# Patient Record
Sex: Female | Born: 1940 | Race: White | Hispanic: No | Marital: Married | Smoking: Former smoker
Health system: Southern US, Community
[De-identification: ages and names within clinical notes are randomized; demographics above are authoritative.]

## PROBLEM LIST (undated history)

## (undated) DIAGNOSIS — I714 Abdominal aortic aneurysm, without rupture, unspecified: Secondary | ICD-10-CM

## (undated) DIAGNOSIS — G43909 Migraine, unspecified, not intractable, without status migrainosus: Secondary | ICD-10-CM

## (undated) DIAGNOSIS — M8580 Other specified disorders of bone density and structure, unspecified site: Secondary | ICD-10-CM

## (undated) DIAGNOSIS — I4891 Unspecified atrial fibrillation: Secondary | ICD-10-CM

## (undated) DIAGNOSIS — M1990 Unspecified osteoarthritis, unspecified site: Secondary | ICD-10-CM

## (undated) DIAGNOSIS — I10 Essential (primary) hypertension: Secondary | ICD-10-CM

## (undated) DIAGNOSIS — E785 Hyperlipidemia, unspecified: Secondary | ICD-10-CM

## (undated) DIAGNOSIS — K219 Gastro-esophageal reflux disease without esophagitis: Secondary | ICD-10-CM

## (undated) DIAGNOSIS — Z95810 Presence of automatic (implantable) cardiac defibrillator: Secondary | ICD-10-CM

## (undated) DIAGNOSIS — J189 Pneumonia, unspecified organism: Secondary | ICD-10-CM

## (undated) DIAGNOSIS — J42 Unspecified chronic bronchitis: Secondary | ICD-10-CM

## (undated) DIAGNOSIS — M109 Gout, unspecified: Secondary | ICD-10-CM

## (undated) DIAGNOSIS — F329 Major depressive disorder, single episode, unspecified: Secondary | ICD-10-CM

## (undated) DIAGNOSIS — E039 Hypothyroidism, unspecified: Secondary | ICD-10-CM

## (undated) DIAGNOSIS — I739 Peripheral vascular disease, unspecified: Secondary | ICD-10-CM

## (undated) DIAGNOSIS — J449 Chronic obstructive pulmonary disease, unspecified: Secondary | ICD-10-CM

## (undated) DIAGNOSIS — I509 Heart failure, unspecified: Secondary | ICD-10-CM

## (undated) DIAGNOSIS — F32A Depression, unspecified: Secondary | ICD-10-CM

## (undated) DIAGNOSIS — Z8719 Personal history of other diseases of the digestive system: Secondary | ICD-10-CM

## (undated) HISTORY — DX: Gastro-esophageal reflux disease without esophagitis: K21.9

## (undated) HISTORY — PX: EYE SURGERY: SHX253

## (undated) HISTORY — PX: COLONOSCOPY: SHX174

## (undated) HISTORY — PX: CATARACT EXTRACTION W/ INTRAOCULAR LENS  IMPLANT, BILATERAL: SHX1307

## (undated) HISTORY — PX: VARICOSE VEIN SURGERY: SHX832

## (undated) HISTORY — PX: CARDIAC CATHETERIZATION: SHX172

## (undated) HISTORY — DX: Migraine, unspecified, not intractable, without status migrainosus: G43.909

## (undated) HISTORY — PX: TIBIA FRACTURE SURGERY: SHX806

## (undated) HISTORY — DX: Unspecified atrial fibrillation: I48.91

## (undated) HISTORY — PX: BREAST SURGERY: SHX581

## (undated) HISTORY — DX: Essential (primary) hypertension: I10

## (undated) HISTORY — DX: Chronic obstructive pulmonary disease, unspecified: J44.9

## (undated) HISTORY — PX: FRACTURE SURGERY: SHX138

## (undated) HISTORY — PX: TONSILLECTOMY: SUR1361

## (undated) HISTORY — DX: Other specified disorders of bone density and structure, unspecified site: M85.80

---

## 1970-03-15 HISTORY — PX: CHOLECYSTECTOMY OPEN: SUR202

## 1997-05-27 ENCOUNTER — Ambulatory Visit (HOSPITAL_COMMUNITY): Admission: RE | Admit: 1997-05-27 | Discharge: 1997-05-27 | Payer: Self-pay | Admitting: Internal Medicine

## 1997-06-05 ENCOUNTER — Other Ambulatory Visit: Admission: RE | Admit: 1997-06-05 | Discharge: 1997-06-05 | Payer: Self-pay | Admitting: Obstetrics and Gynecology

## 1998-01-06 ENCOUNTER — Other Ambulatory Visit: Admission: RE | Admit: 1998-01-06 | Discharge: 1998-01-06 | Payer: Self-pay | Admitting: Obstetrics and Gynecology

## 1998-01-09 ENCOUNTER — Other Ambulatory Visit: Admission: RE | Admit: 1998-01-09 | Discharge: 1998-01-09 | Payer: Self-pay | Admitting: Obstetrics and Gynecology

## 1998-04-07 ENCOUNTER — Other Ambulatory Visit: Admission: RE | Admit: 1998-04-07 | Discharge: 1998-04-07 | Payer: Self-pay | Admitting: Obstetrics and Gynecology

## 1998-09-26 ENCOUNTER — Other Ambulatory Visit: Admission: RE | Admit: 1998-09-26 | Discharge: 1998-09-26 | Payer: Self-pay | Admitting: Obstetrics and Gynecology

## 1998-09-26 ENCOUNTER — Encounter (INDEPENDENT_AMBULATORY_CARE_PROVIDER_SITE_OTHER): Payer: Self-pay

## 2001-03-09 ENCOUNTER — Encounter: Payer: Self-pay | Admitting: Internal Medicine

## 2001-03-09 ENCOUNTER — Ambulatory Visit (HOSPITAL_COMMUNITY): Admission: RE | Admit: 2001-03-09 | Discharge: 2001-03-09 | Payer: Self-pay | Admitting: Internal Medicine

## 2002-04-06 ENCOUNTER — Other Ambulatory Visit: Admission: RE | Admit: 2002-04-06 | Discharge: 2002-04-06 | Payer: Self-pay | Admitting: Obstetrics and Gynecology

## 2003-04-09 ENCOUNTER — Other Ambulatory Visit: Admission: RE | Admit: 2003-04-09 | Discharge: 2003-04-09 | Payer: Self-pay | Admitting: Obstetrics and Gynecology

## 2003-05-19 ENCOUNTER — Emergency Department (HOSPITAL_COMMUNITY): Admission: AD | Admit: 2003-05-19 | Discharge: 2003-05-19 | Payer: Self-pay | Admitting: Family Medicine

## 2003-05-22 ENCOUNTER — Inpatient Hospital Stay (HOSPITAL_COMMUNITY): Admission: EM | Admit: 2003-05-22 | Discharge: 2003-05-28 | Payer: Self-pay | Admitting: Emergency Medicine

## 2003-05-23 ENCOUNTER — Encounter: Payer: Self-pay | Admitting: Cardiology

## 2003-05-30 ENCOUNTER — Inpatient Hospital Stay (HOSPITAL_COMMUNITY): Admission: EM | Admit: 2003-05-30 | Discharge: 2003-05-31 | Payer: Self-pay | Admitting: Emergency Medicine

## 2003-06-01 ENCOUNTER — Inpatient Hospital Stay (HOSPITAL_COMMUNITY): Admission: AD | Admit: 2003-06-01 | Discharge: 2003-06-11 | Payer: Self-pay | Admitting: Cardiology

## 2003-06-03 ENCOUNTER — Encounter: Payer: Self-pay | Admitting: Cardiology

## 2003-06-12 ENCOUNTER — Inpatient Hospital Stay (HOSPITAL_COMMUNITY): Admission: EM | Admit: 2003-06-12 | Discharge: 2003-06-21 | Payer: Self-pay | Admitting: Emergency Medicine

## 2003-07-18 ENCOUNTER — Inpatient Hospital Stay (HOSPITAL_COMMUNITY): Admission: EM | Admit: 2003-07-18 | Discharge: 2003-07-21 | Payer: Self-pay | Admitting: Emergency Medicine

## 2003-10-09 ENCOUNTER — Ambulatory Visit (HOSPITAL_COMMUNITY): Admission: RE | Admit: 2003-10-09 | Discharge: 2003-10-09 | Payer: Self-pay | Admitting: Internal Medicine

## 2004-01-14 ENCOUNTER — Ambulatory Visit: Payer: Self-pay

## 2004-02-11 ENCOUNTER — Ambulatory Visit: Payer: Self-pay | Admitting: *Deleted

## 2004-02-25 ENCOUNTER — Ambulatory Visit: Payer: Self-pay | Admitting: Cardiology

## 2004-03-12 ENCOUNTER — Ambulatory Visit: Payer: Self-pay | Admitting: Internal Medicine

## 2004-03-17 ENCOUNTER — Ambulatory Visit: Payer: Self-pay | Admitting: *Deleted

## 2004-03-24 ENCOUNTER — Ambulatory Visit: Payer: Self-pay | Admitting: Internal Medicine

## 2004-03-24 ENCOUNTER — Ambulatory Visit: Payer: Self-pay | Admitting: Cardiology

## 2004-03-26 ENCOUNTER — Ambulatory Visit: Payer: Self-pay

## 2004-04-07 ENCOUNTER — Ambulatory Visit: Payer: Self-pay | Admitting: Cardiology

## 2004-04-21 ENCOUNTER — Ambulatory Visit: Payer: Self-pay | Admitting: Cardiology

## 2004-04-23 ENCOUNTER — Ambulatory Visit (HOSPITAL_COMMUNITY): Admission: RE | Admit: 2004-04-23 | Discharge: 2004-04-23 | Payer: Self-pay | Admitting: Cardiology

## 2004-04-27 ENCOUNTER — Ambulatory Visit: Payer: Self-pay

## 2004-04-27 ENCOUNTER — Ambulatory Visit: Payer: Self-pay | Admitting: Internal Medicine

## 2004-04-29 ENCOUNTER — Other Ambulatory Visit: Admission: RE | Admit: 2004-04-29 | Discharge: 2004-04-29 | Payer: Self-pay | Admitting: Obstetrics and Gynecology

## 2004-05-07 ENCOUNTER — Ambulatory Visit: Payer: Self-pay | Admitting: Internal Medicine

## 2004-05-08 ENCOUNTER — Ambulatory Visit: Payer: Self-pay | Admitting: Internal Medicine

## 2004-05-12 ENCOUNTER — Ambulatory Visit: Payer: Self-pay | Admitting: Cardiology

## 2004-05-14 ENCOUNTER — Ambulatory Visit: Payer: Self-pay | Admitting: Internal Medicine

## 2004-05-26 ENCOUNTER — Ambulatory Visit: Payer: Self-pay | Admitting: Internal Medicine

## 2004-06-09 ENCOUNTER — Ambulatory Visit: Payer: Self-pay | Admitting: Cardiology

## 2004-07-07 ENCOUNTER — Ambulatory Visit: Payer: Self-pay | Admitting: *Deleted

## 2004-07-07 ENCOUNTER — Ambulatory Visit: Payer: Self-pay | Admitting: Internal Medicine

## 2004-07-28 ENCOUNTER — Ambulatory Visit: Payer: Self-pay | Admitting: Cardiology

## 2004-08-25 ENCOUNTER — Ambulatory Visit: Payer: Self-pay | Admitting: Cardiovascular Disease

## 2004-09-21 ENCOUNTER — Ambulatory Visit: Payer: Self-pay | Admitting: Internal Medicine

## 2004-10-20 ENCOUNTER — Ambulatory Visit: Payer: Self-pay | Admitting: Cardiology

## 2004-11-03 ENCOUNTER — Ambulatory Visit: Payer: Self-pay | Admitting: Cardiology

## 2004-11-17 ENCOUNTER — Ambulatory Visit: Payer: Self-pay | Admitting: Cardiology

## 2004-12-08 ENCOUNTER — Ambulatory Visit: Payer: Self-pay | Admitting: Cardiology

## 2004-12-18 ENCOUNTER — Ambulatory Visit: Payer: Self-pay | Admitting: Cardiology

## 2005-01-05 ENCOUNTER — Ambulatory Visit: Payer: Self-pay | Admitting: Cardiology

## 2005-01-22 ENCOUNTER — Ambulatory Visit: Payer: Self-pay | Admitting: Internal Medicine

## 2005-02-02 ENCOUNTER — Ambulatory Visit: Payer: Self-pay | Admitting: Cardiology

## 2005-03-02 ENCOUNTER — Ambulatory Visit: Payer: Self-pay | Admitting: Cardiology

## 2005-03-17 ENCOUNTER — Ambulatory Visit: Payer: Self-pay | Admitting: Cardiovascular Disease

## 2005-03-30 ENCOUNTER — Ambulatory Visit: Payer: Self-pay | Admitting: Cardiology

## 2005-04-13 ENCOUNTER — Ambulatory Visit: Payer: Self-pay | Admitting: Cardiology

## 2005-04-30 ENCOUNTER — Other Ambulatory Visit: Admission: RE | Admit: 2005-04-30 | Discharge: 2005-04-30 | Payer: Self-pay | Admitting: Obstetrics and Gynecology

## 2005-05-04 ENCOUNTER — Ambulatory Visit: Payer: Self-pay | Admitting: Cardiology

## 2005-05-25 ENCOUNTER — Ambulatory Visit: Payer: Self-pay | Admitting: Cardiology

## 2005-06-08 ENCOUNTER — Ambulatory Visit: Payer: Self-pay | Admitting: Cardiology

## 2005-07-06 ENCOUNTER — Ambulatory Visit: Payer: Self-pay | Admitting: Cardiology

## 2005-08-03 ENCOUNTER — Ambulatory Visit: Payer: Self-pay | Admitting: *Deleted

## 2005-08-19 ENCOUNTER — Ambulatory Visit: Payer: Self-pay | Admitting: Internal Medicine

## 2005-08-24 ENCOUNTER — Ambulatory Visit: Payer: Self-pay | Admitting: Cardiology

## 2005-09-21 ENCOUNTER — Ambulatory Visit: Payer: Self-pay | Admitting: Cardiology

## 2005-10-12 ENCOUNTER — Ambulatory Visit: Payer: Self-pay | Admitting: Cardiology

## 2005-11-02 ENCOUNTER — Ambulatory Visit: Payer: Self-pay | Admitting: Internal Medicine

## 2005-11-23 ENCOUNTER — Ambulatory Visit: Payer: Self-pay | Admitting: Cardiology

## 2005-12-14 ENCOUNTER — Ambulatory Visit: Payer: Self-pay | Admitting: Cardiovascular Disease

## 2006-01-04 ENCOUNTER — Ambulatory Visit: Payer: Self-pay | Admitting: Cardiology

## 2006-02-01 ENCOUNTER — Ambulatory Visit: Payer: Self-pay | Admitting: Internal Medicine

## 2006-02-22 ENCOUNTER — Ambulatory Visit: Payer: Self-pay | Admitting: Cardiology

## 2006-03-11 ENCOUNTER — Ambulatory Visit (HOSPITAL_COMMUNITY): Admission: RE | Admit: 2006-03-11 | Discharge: 2006-03-11 | Payer: Self-pay | Admitting: Internal Medicine

## 2006-03-30 ENCOUNTER — Ambulatory Visit: Payer: Self-pay | Admitting: Cardiology

## 2006-03-30 ENCOUNTER — Ambulatory Visit: Payer: Self-pay | Admitting: Internal Medicine

## 2006-04-19 ENCOUNTER — Ambulatory Visit: Payer: Self-pay | Admitting: Internal Medicine

## 2006-04-19 ENCOUNTER — Ambulatory Visit: Payer: Self-pay | Admitting: Cardiology

## 2006-04-19 LAB — CONVERTED CEMR LAB
BUN: 17 mg/dL (ref 6–23)
Calcium: 9.3 mg/dL (ref 8.4–10.5)
Creatinine, Ser: 1 mg/dL (ref 0.4–1.2)
Digitoxin Lvl: 0.9 ng/mL (ref 0.8–2.0)
GFR calc Af Amer: 72 mL/min
Potassium: 4.4 meq/L (ref 3.5–5.1)

## 2006-04-25 ENCOUNTER — Ambulatory Visit: Payer: Self-pay | Admitting: Internal Medicine

## 2006-05-10 ENCOUNTER — Other Ambulatory Visit: Admission: RE | Admit: 2006-05-10 | Discharge: 2006-05-10 | Payer: Self-pay | Admitting: Obstetrics and Gynecology

## 2006-05-31 ENCOUNTER — Ambulatory Visit: Payer: Self-pay | Admitting: Cardiology

## 2006-06-28 ENCOUNTER — Ambulatory Visit: Payer: Self-pay | Admitting: Internal Medicine

## 2006-06-28 ENCOUNTER — Ambulatory Visit: Payer: Self-pay | Admitting: Cardiovascular Disease

## 2006-07-26 ENCOUNTER — Ambulatory Visit: Payer: Self-pay | Admitting: Cardiology

## 2006-08-23 ENCOUNTER — Ambulatory Visit: Payer: Self-pay | Admitting: Internal Medicine

## 2006-09-13 ENCOUNTER — Ambulatory Visit: Payer: Self-pay | Admitting: Cardiology

## 2006-09-27 ENCOUNTER — Ambulatory Visit: Payer: Self-pay | Admitting: Cardiology

## 2006-10-18 ENCOUNTER — Ambulatory Visit: Payer: Self-pay | Admitting: Cardiology

## 2006-11-04 ENCOUNTER — Ambulatory Visit: Payer: Self-pay | Admitting: Cardiology

## 2006-11-25 ENCOUNTER — Ambulatory Visit: Payer: Self-pay | Admitting: Cardiology

## 2006-12-15 ENCOUNTER — Ambulatory Visit: Payer: Self-pay | Admitting: Cardiology

## 2006-12-15 LAB — CONVERTED CEMR LAB
BUN: 18 mg/dL (ref 6–23)
Calcium: 9.5 mg/dL (ref 8.4–10.5)
Creatinine, Ser: 1.2 mg/dL (ref 0.4–1.2)
GFR calc Af Amer: 58 mL/min
Potassium: 4.7 meq/L (ref 3.5–5.1)

## 2006-12-23 ENCOUNTER — Ambulatory Visit (HOSPITAL_COMMUNITY): Admission: RE | Admit: 2006-12-23 | Discharge: 2006-12-23 | Payer: Self-pay | Admitting: Internal Medicine

## 2007-01-13 ENCOUNTER — Ambulatory Visit: Payer: Self-pay | Admitting: Internal Medicine

## 2007-01-13 ENCOUNTER — Ambulatory Visit: Payer: Self-pay | Admitting: Cardiology

## 2007-02-10 ENCOUNTER — Ambulatory Visit: Payer: Self-pay | Admitting: Cardiovascular Disease

## 2007-03-17 ENCOUNTER — Ambulatory Visit: Payer: Self-pay | Admitting: Cardiovascular Disease

## 2007-04-14 ENCOUNTER — Ambulatory Visit: Payer: Self-pay | Admitting: Cardiology

## 2007-04-27 ENCOUNTER — Ambulatory Visit (HOSPITAL_COMMUNITY): Admission: RE | Admit: 2007-04-27 | Discharge: 2007-04-27 | Payer: Self-pay | Admitting: Internal Medicine

## 2007-05-12 ENCOUNTER — Ambulatory Visit: Payer: Self-pay | Admitting: Cardiology

## 2007-05-30 ENCOUNTER — Other Ambulatory Visit: Admission: RE | Admit: 2007-05-30 | Discharge: 2007-05-30 | Payer: Self-pay | Admitting: Obstetrics and Gynecology

## 2007-06-09 ENCOUNTER — Ambulatory Visit: Payer: Self-pay | Admitting: Internal Medicine

## 2007-08-02 ENCOUNTER — Ambulatory Visit: Payer: Self-pay | Admitting: Cardiology

## 2007-09-01 ENCOUNTER — Ambulatory Visit: Payer: Self-pay | Admitting: Cardiology

## 2007-11-10 ENCOUNTER — Ambulatory Visit: Payer: Self-pay | Admitting: Cardiology

## 2007-12-01 ENCOUNTER — Ambulatory Visit: Payer: Self-pay | Admitting: Internal Medicine

## 2008-01-12 ENCOUNTER — Ambulatory Visit: Payer: Self-pay | Admitting: Internal Medicine

## 2008-02-19 ENCOUNTER — Ambulatory Visit: Payer: Self-pay | Admitting: Internal Medicine

## 2008-03-14 ENCOUNTER — Ambulatory Visit: Payer: Self-pay | Admitting: Cardiology

## 2008-04-09 ENCOUNTER — Ambulatory Visit: Payer: Self-pay | Admitting: Cardiology

## 2008-05-03 ENCOUNTER — Ambulatory Visit: Payer: Self-pay | Admitting: Internal Medicine

## 2008-06-12 ENCOUNTER — Ambulatory Visit: Payer: Self-pay | Admitting: Obstetrics and Gynecology

## 2008-06-12 ENCOUNTER — Other Ambulatory Visit: Admission: RE | Admit: 2008-06-12 | Discharge: 2008-06-12 | Payer: Self-pay | Admitting: Obstetrics and Gynecology

## 2008-06-12 ENCOUNTER — Encounter: Payer: Self-pay | Admitting: Obstetrics and Gynecology

## 2008-06-21 ENCOUNTER — Ambulatory Visit: Payer: Self-pay | Admitting: Obstetrics and Gynecology

## 2008-08-13 ENCOUNTER — Encounter: Payer: Self-pay | Admitting: *Deleted

## 2008-09-18 ENCOUNTER — Encounter: Payer: Self-pay | Admitting: *Deleted

## 2008-11-07 ENCOUNTER — Encounter: Payer: Self-pay | Admitting: Internal Medicine

## 2008-11-07 ENCOUNTER — Telehealth: Payer: Self-pay | Admitting: Internal Medicine

## 2008-11-22 DIAGNOSIS — I10 Essential (primary) hypertension: Secondary | ICD-10-CM

## 2008-11-22 DIAGNOSIS — I2510 Atherosclerotic heart disease of native coronary artery without angina pectoris: Secondary | ICD-10-CM

## 2008-11-22 DIAGNOSIS — J449 Chronic obstructive pulmonary disease, unspecified: Secondary | ICD-10-CM | POA: Insufficient documentation

## 2008-11-22 DIAGNOSIS — R0902 Hypoxemia: Secondary | ICD-10-CM

## 2008-11-22 DIAGNOSIS — K219 Gastro-esophageal reflux disease without esophagitis: Secondary | ICD-10-CM

## 2008-11-22 DIAGNOSIS — I4891 Unspecified atrial fibrillation: Secondary | ICD-10-CM | POA: Insufficient documentation

## 2008-11-22 DIAGNOSIS — G43909 Migraine, unspecified, not intractable, without status migrainosus: Secondary | ICD-10-CM | POA: Insufficient documentation

## 2008-11-25 ENCOUNTER — Ambulatory Visit: Payer: Self-pay | Admitting: Internal Medicine

## 2008-11-25 DIAGNOSIS — I5042 Chronic combined systolic (congestive) and diastolic (congestive) heart failure: Secondary | ICD-10-CM | POA: Insufficient documentation

## 2008-11-29 ENCOUNTER — Encounter: Payer: Self-pay | Admitting: Internal Medicine

## 2008-11-29 ENCOUNTER — Ambulatory Visit: Payer: Self-pay

## 2008-12-05 ENCOUNTER — Telehealth: Payer: Self-pay | Admitting: Internal Medicine

## 2008-12-18 ENCOUNTER — Ambulatory Visit: Payer: Self-pay | Admitting: Obstetrics and Gynecology

## 2008-12-31 ENCOUNTER — Ambulatory Visit: Payer: Self-pay | Admitting: Obstetrics and Gynecology

## 2009-01-10 ENCOUNTER — Ambulatory Visit: Payer: Self-pay | Admitting: Obstetrics and Gynecology

## 2009-02-12 ENCOUNTER — Ambulatory Visit: Payer: Self-pay | Admitting: Internal Medicine

## 2009-06-18 ENCOUNTER — Ambulatory Visit: Payer: Self-pay | Admitting: Obstetrics and Gynecology

## 2009-06-23 ENCOUNTER — Encounter: Payer: Self-pay | Admitting: Internal Medicine

## 2009-08-29 ENCOUNTER — Encounter: Payer: Self-pay | Admitting: Internal Medicine

## 2009-10-24 ENCOUNTER — Ambulatory Visit (HOSPITAL_COMMUNITY): Admission: RE | Admit: 2009-10-24 | Discharge: 2009-10-24 | Payer: Self-pay | Admitting: Internal Medicine

## 2009-11-12 ENCOUNTER — Ambulatory Visit: Payer: Self-pay | Admitting: Internal Medicine

## 2009-12-03 ENCOUNTER — Encounter: Payer: Self-pay | Admitting: Internal Medicine

## 2009-12-03 ENCOUNTER — Ambulatory Visit: Payer: Self-pay

## 2010-04-05 ENCOUNTER — Encounter: Payer: Self-pay | Admitting: Internal Medicine

## 2010-04-14 NOTE — Miscellaneous (Signed)
Summary: AAA Ultrasound  Clinical Lists Changes  Problems: Added new problem of ABDOMINAL AORTIC ANEURYSM (ICD-441.4) Orders: Added new Test order of Abdominal Aorta Duplex (Abd Aorta Duplex) - Signed

## 2010-04-14 NOTE — Assessment & Plan Note (Signed)
Summary: rov/jml   Visit Type:  Follow-up Primary Provider:  Dr.McKeown   History of Present Illness: Katie [name] returns today for followup.  She is a 70 yo woman with a h/o atrial fibrillation, AAA, moderate LV dysfunction and class 2 CHF.  When I saw her several weeks ago, she had c/o increased dyspnea and told that she had bruits.  She had a 2D echo and carotid dopplers which demonstrated an EF 40% and no obstructive carotid vascular disease.  The patient remains active, and is walking 3 times a week for 3 miles a day.   No C/P or peripheral edema.  Current Medications (verified): 1)  Aspirin 81 Mg Tbec (Aspirin) .... Take One Tablet By Mouth Daily 2)  Cvs Vit D 5000 High-Potency 5000 Unit Caps (Cholecalciferol) .Marland Kitchen.. 1 By Mouth Once Daily 3)  Calcium Carbonate   Powd (Calcium Carbonate) .Marland Kitchen.. 1 By Mouth Once Daily 4)  Pravastatin Sodium 40 Mg Tabs (Pravastatin Sodium) .... Take One Tablet By Mouth Daily At Bedtime 5)  Warfarin Sodium 2 Mg Tabs (Warfarin Sodium) .... Use As Directed By Anticoagualtion Clinic 6)  Metoprolol Succinate 50 Mg Xr24h-Tab (Metoprolol Succinate) .... Take One Tablet By Mouth Once Daily 7)  Digoxin 0.25 Mg Tabs (Digoxin) .... Take One Tablet By Mouth Daily 8)  Spiriva Handihaler 18 Mcg Caps (Tiotropium Bromide Monohydrate) .Marland Kitchen.. 1 By Mouth Once Daily 9)  Furosemide 80 Mg Tabs (Furosemide) .... Take One Tablet By Mouth Daily. 10)  Mag-Ox 400 400 Mg Tabs (Magnesium Oxide) .Marland Kitchen.. 1 By Mouth Once Daily 11)  Glucosamine 500 Mg Caps (Glucosamine Sulfate) .Marland Kitchen.. 1 By Mouth Once Daily 12)  Levothroid 50 Mcg Tabs (Levothyroxine Sodium) .... Once Daily 13)  Allopurinol 300 Mg Tabs (Allopurinol) .... 1/2 Tab Once Daily  Allergies: 1)  ! Codeine  Past History:  Past Medical History: Last updated: 11/22/2008 ATRIAL FIBRILLATION (ICD-427.31) COPD (ICD-496) BRONCHITIS, CHRONIC (ICD-491.9) BELL'S PALSY, HX OF (ICD-V12.49) GERD (ICD-530.81) HYPERTENSION (ICD-401.9) CAD  (ICD-414.00) MIGRAINE HEADACHE (ICD-346.90) ATRIAL FLUTTER (ICD-427.32)  Past Surgical History: Last updated: 11/22/2008 Cholecystectomy  Review of Systems  The patient denies chest pain, syncope, dyspnea on exertion, and peripheral edema.    Vital Signs:  Patient profile:   70 year old female Height:      64 inches Weight:      186 pounds BMI:     32.04 Pulse rate:   89 / minute BP sitting:   120 / 64  (left arm)  Vitals Entered By: Margaretmary Bayley CMA (November 12, 2009 1:56 PM)  Physical Exam  General:  Well developed, well nourished, in no acute distress. Head:  normocephalic and atraumatic Eyes:  PERRLA/EOM intact; conjunctiva and lids normal. Mouth:  Teeth, gums and palate normal. Oral mucosa normal. Neck:  JVD 6 cm.  No thyromegally.  Bilateral carotid bruits are present. Lungs:  Scattered rales in the bases bilaterally without wheezes, rhonchi or increased work of breathing. Heart:  IRIRI with normal S1 and S2.  PMI is enlarged and laterally displaced. Abdomen:  Bowel sounds positive; abdomen soft and non-tender without masses, organomegaly, or hernias noted. No hepatosplenomegaly. Msk:  Back normal, normal gait. Muscle strength and tone normal. Pulses:  pulses normal in all 4 extremities Extremities:  No clubbing or cyanosis. Neurologic:  Alert and oriented x 3.   EKG  Procedure date:  11/12/2009  Findings:      Atrial fibrillation with a controlled ventricular response rate of: 89.  Impression & Recommendations:  Problem # 1:  ATRIAL FIBRILLATION (ICD-427.31) Her symptoms and ventricular rate appears to be well controlled.  I have asked her to continue her current meds and maintain a low sodium diet. Her updated medication list for this problem includes:    Aspirin 81 Mg Tbec (Aspirin) .Marland Kitchen... Take one tablet by mouth daily    Warfarin Sodium 2 Mg Tabs (Warfarin sodium) ..... Use as directed by anticoagualtion clinic    Metoprolol Succinate 50 Mg Xr24h-tab  (Metoprolol succinate) .Marland Kitchen... Take one tablet by mouth once daily    Digoxin 0.25 Mg Tabs (Digoxin) .Marland Kitchen... Take one tablet by mouth daily  Orders: EKG w/ Interpretation (93000)  Problem # 2:  CONGESTIVE HEART FAILURE UNSPECIFIED (ICD-428.0) Her symptoms are well controlled.  I have asked that she continue her current meds and maintain a low sodium diet. Her updated medication list for this problem includes:    Aspirin 81 Mg Tbec (Aspirin) .Marland Kitchen... Take one tablet by mouth daily    Warfarin Sodium 2 Mg Tabs (Warfarin sodium) ..... Use as directed by anticoagualtion clinic    Metoprolol Succinate 50 Mg Xr24h-tab (Metoprolol succinate) .Marland Kitchen... Take one tablet by mouth once daily    Digoxin 0.25 Mg Tabs (Digoxin) .Marland Kitchen... Take one tablet by mouth daily    Furosemide 80 Mg Tabs (Furosemide) .Marland Kitchen... Take one tablet by mouth daily.  Problem # 3:  HYPERTENSION (ICD-401.9) She will continue her current meds. Her updated medication list for this problem includes:    Aspirin 81 Mg Tbec (Aspirin) .Marland Kitchen... Take one tablet by mouth daily    Metoprolol Succinate 50 Mg Xr24h-tab (Metoprolol succinate) .Marland Kitchen... Take one tablet by mouth once daily    Furosemide 80 Mg Tabs (Furosemide) .Marland Kitchen... Take one tablet by mouth daily.  Patient Instructions: 1)  Your physician recommends that you schedule a follow-up appointment in: [place] Dr. Lovena Le 2)  Your physician recommends that you continue on your current medications as directed. Please refer to the Current Medication list given to you today. 3)  Your physician recommends that you return for lab work in: 4)  Your physician has requested that you have an abdominal aorta duplex. During this test, an ultrasound is used to evaluate the aorta. Allow 30 minutes for this exam. Do not eat after midnight the day before and avoid carbonated beverages. There are no restrictions or special instructions. Evaluate abdonimal aortic aneurysm size.

## 2010-06-25 ENCOUNTER — Encounter: Payer: Self-pay | Admitting: Obstetrics and Gynecology

## 2010-06-25 LAB — PROTIME-INR

## 2010-07-06 ENCOUNTER — Other Ambulatory Visit: Payer: Self-pay | Admitting: Obstetrics and Gynecology

## 2010-07-06 ENCOUNTER — Encounter (INDEPENDENT_AMBULATORY_CARE_PROVIDER_SITE_OTHER): Payer: Medicare Other | Admitting: Obstetrics and Gynecology

## 2010-07-06 ENCOUNTER — Other Ambulatory Visit (HOSPITAL_COMMUNITY)
Admission: RE | Admit: 2010-07-06 | Discharge: 2010-07-06 | Disposition: A | Discharge status: Home / Self Care | Payer: Medicare Other | Source: Ambulatory Visit | Attending: Obstetrics and Gynecology | Admitting: Obstetrics and Gynecology

## 2010-07-06 DIAGNOSIS — N952 Postmenopausal atrophic vaginitis: Secondary | ICD-10-CM

## 2010-07-06 DIAGNOSIS — Z124 Encounter for screening for malignant neoplasm of cervix: Secondary | ICD-10-CM

## 2010-07-06 DIAGNOSIS — N951 Menopausal and female climacteric states: Secondary | ICD-10-CM

## 2010-07-06 DIAGNOSIS — N8110 Cystocele, unspecified: Secondary | ICD-10-CM

## 2010-07-06 DIAGNOSIS — R350 Frequency of micturition: Secondary | ICD-10-CM

## 2010-07-14 LAB — HM PAP SMEAR

## 2010-07-28 NOTE — Assessment & Plan Note (Signed)
[facility]                         ELECTROPHYSIOLOGY OFFICE NOTE   NAME:SNEADAnael, Renz                       MRN:          [id number]  DATE:01/13/2007                            DOB:          07-17-1940    Ms. Terpening returns today for followup.  She is a very pleasant woman with  a history of chronic atrial fibrillation and non-ischemic cardiomyopathy  with mild to moderate LV dysfunction (EF 45% in the past), who returns  today for followup.  She continues to exercise five days a week, walking  two miles a day.  In the last couple of days, she noted a productive  cough with yellow-green sputum production.  No fevers or chills have  been noted.  She returns for additional evaluation.   MEDICATIONS:  1. Aspirin 81 a day.  2. Vitamin D.  3. Calcium.  4. Lasix 40 twice daily.  5. Pravachol 40 a day.  6. Warfarin as directed.  7. Toprol 50 daily.  8. Digoxin 0.25 micrograms daily.   NECK:  Revealed no jugular venous distention.  LUNGS:  Clear bilaterally to auscultation, no wheezes, rales or rhonchi  are present today.  CARDIOVASCULAR EXAM:  Revealed an irregularly irregular rhythm with  normal S1 and S2.  EXTREMITIES:  Demonstrated no edema.  Pulses are 2+ and symmetric.  The weight was 192 pounds.   EKG demonstrates atrial fibrillation with a controlled ventricular  response.   IMPRESSION:  1. Chronic atrial fibrillation.  2. New onset of bronchitis.  3. Non-ischemic cardiomyopathy (intolerant to ACE inhibitors).   DISCUSSION:  Overall, Ms. Kassner is stable, except that she has some  evidence of bronchitis and I have given her a prescription today for an  azithromycin Dosepak.  I plan to see her back in a year.  We discussed  today the possibility of ultimately needing a pacemaker.  At the present  time, I think that is not necessary, but have given her warnings that,  if she develops syncope or worsening shortness of breath, then she is  instructed to call us.     Champ Mungo. Lovena Le, MD  Electronically Signed    GWT/MedQ  DD: 01/13/2007  DT: 01/13/2007  Job #: [id number]   cc:   [name], M.D.

## 2010-07-28 NOTE — Assessment & Plan Note (Signed)
[facility] OFFICE NOTE   NAME:SNEADDajha, Gelardi                       MRN:          [id number]  DATE:01/12/2008                            DOB:          1941/01/08    Katie [name] returns today for followup.  She is a very pleasant 70-year-  old woman with a history of longstanding persistent AFib and diastolic  heart failure and COPD with chronic bronchitis who returns today for  followup.  She continues to not smoke cigarettes and has been smoked  free for several years.  The patient denies chest pain or shortness of  breath today.  She states that when she gets it does due to some  strenuous activity that she will get short of breath, but is otherwise  been quite stable.   MEDICATIONS:  1. Aspirin 81 a day.  2. Pravachol 40 a day.  3. Warfarin as directed.  4. Metoprolol 50 a day.  5. Digoxin 0.25 mg daily.  6. Spiriva 18 mcg daily.  7. Lasix 40 mg half tablet 3 times a day.   The patient has not been tolerant of ACE inhibitor secondary to cough in  the past.   PHYSICAL EXAMINATION:  GENERAL:  She is a pleasant, well-appearing 36-  year-old woman in no acute distress.  VITAL SIGNS:  Blood pressure today was 126/82, the pulse 61 and regular,  the respirations were 18.  NECK:  No jugular venous distention.  LUNGS:  Clear bilaterally to auscultation.  No wheezes, rales, or  rhonchi are present.  CARDIOVASCULAR:  Irregular rhythm with normal S1 and S2.  No murmurs,  rubs, gallops.  ABDOMINAL:  Soft, nontender.  EXTREMITIES:  No edema.   EKG demonstrates atrial fibrillation with an intraventricular conduction  delay.   IMPRESSION:  1. Congestive heart failure predominantly diastolic dysfunction.  2. Chronic atrial fibrillation, initially with a rapid ventricular      response, now with controlled ventricular response.  3. Chronic bronchitis presently stable.  4. Hypertension.   DISCUSSION:   Overall, Katie [name] is stable from arrhythmia perspective.  Her AFib appears to be well controlled.  We will continue her on  Coumadin for thromboembolic prevention.  We will  continue her on beta-blockers and digoxin.  She has not been tolerant of  ACE inhibitors in the  past secondary to cough and will not give her any more dose.  I will  plan to see her back in 1 year.  She has already had flu vaccine.     Champ Mungo. Lovena Le, MD  Electronically Signed    GWT/MedQ  DD: 01/12/2008  DT: 01/12/2008  Job #: [id number]

## 2010-07-29 ENCOUNTER — Encounter: Payer: Self-pay | Admitting: Internal Medicine

## 2010-07-31 ENCOUNTER — Encounter: Payer: Self-pay | Admitting: Internal Medicine

## 2010-07-31 NOTE — H&P (Signed)
NAME:  Katie [name], MECHEM                          ACCOUNT NO.:  [id number]   MEDICAL RECORD NO.:  [id number]                   PATIENT TYPE:  INP   LOCATION:  1830                                 FACILITY:  [facility]   PHYSICIAN:  Kirk Ruths, M.D. [facility]            DATE OF BIRTH:  January 22, 1941   DATE OF ADMISSION:  07/18/2003  DATE OF DISCHARGE:                                HISTORY & PHYSICAL   HISTORY OF PRESENT ILLNESS:  Ms. Samford is a 70 year old female with a  history of recurrent atrial arrhythmias as well as COPD, nonischemic  cardiomyopathy, hypertension, GERD, and nonobstructive coronary artery  disease who underwent cardiac catheterization on May 24, 2003 and was  found to have an EF of 30-35%.  Echo at that time revealed moderate aortic  insufficiency and moderate MR.  At that time she had been treated with  Tikosyn as well as cardioversion.  She failed Tikosyn eventually and was  placed on amiodarone on June 12, 2003.  The patient was discharged in  normal sinus rhythm on June 21, 2003.  Per old records the patient was in  normal sinus rhythm off telemetry on June 20, 2003 with a therapeutic INR  on June 21, 2003 of 2.6.   The patient apparently developed recurrent symptoms and was seen in the  office 1.5 weeks where she complained of chest tightness, dyspnea on  exertion as well as orthopnea.  She was told she was in atrial flutter and  was told to continue the amiodarone load.  Symptoms worsened, however, and  she came to the emergency room tonight.  Her EKG reveals atrial flutter with  rapid ventricular rate with a new left bundle branch block.   ALLERGIES:  CODEINE.   MEDICATIONS:  1. Wellbutrin 150 mg b.i.d.  2. Diovan 160 mg a day.  3. Baby aspirin daily.  4. Amiodarone 400 mg daily.  5. Lasix 40 mg a day.  6. Toprol 25 mg b.i.d.  7. Coumadin daily.  8. Spiriva inhaler.   PAST MEDICAL HISTORY:  1. A flutter status post DC cardioversion, failed Tikosyn.  2.  History of migraines.  3. History of nonobstructive coronary artery disease; EF 35%.  4. Hypertension.  5. GERD.  6. History of Bell's palsy.  7. Status post cholecystectomy.  8. __________ COPD.   SOCIAL HISTORY:  Lives in [place] with her husband.  She has one  daughter.  She has a history of tobacco abuse.  No alcohol or illicit drug  use.   FAMILY HISTORY:  Mother died secondary to cirrhosis.  Father died of unknown  reasons.   REVIEW OF SYSTEMS:  Chest pain, shortness of breath and palpitations,  otherwise negative.   PHYSICAL EXAMINATION:  VITAL SIGNS:  Temp 97.3, blood pressure 151.80, pulse  119 and respirations 22.  GENERAL APPEARANCE:  In general she appears dyspneic and anxious.  HEENT: Grossly normal.  NECK:  No carotid bruits, JVD or thyromegaly.  CHEST:  Clear to auscultation bilaterally.  LUNGS:  No wheezing rhonchi.  HEART:  Tachycardia, __________ systolic murmur.  No rubs.  SKIN:  No rash.  No lesions.  ABDOMEN:  Abdomen soft.  No distention.  Good bowel sounds.  No HSM.  EXTREMITIES:  No clubbing, cyanosis or edema.  Palpable dorsalis pedis  pulses bilaterally.  NEUROLOGIC EXAMINATION:  Cranial nerves II-XII are grossly intact.   LABORATORY DATA:  Chest x-ray is pending.  EKG revealed heart rate of 134, a  left bundle branch block, atrial flutter with rapid ventricular rate.  Laboratory studies are pending at this time.   ASSESSMENT AND PLAN:  1. Atrial flutter on amiodarone and a rapid rate, and becoming dyspneic.  2. Nonobstructive coronary artery disease.  3. Cardiomyopathy; ejection fraction 35% with superimposed atrial     arrhythmia.  4. Hypertension.  5. Gastroesophageal reflux disease.  6. Chronic obstructive pulmonary disease.   Plan:  Our plans are to:  1. Admit the patient.  2. We need to check the records from the Coumadin clinic in the morning     regarding the patient's pro times to assures the patient has been in a     therapeutic  INR range since discharged.  She was therapeutic at 2.6 on     June 21, 2003 and she has remained therapeutic since her discharge she     will need to go ahead and have a cardioversion tomorrow. If she has not     remained therapeutic she will need a TEE guided cardioversion.  3. If she fails cardioversion with amiodarone she will ultimately need     either an atrial fibrillation-flutter ablation versus an AV nodal     ablation/pacemaker.  4. The patient will need to continue her Coumadin load.   The patient was seen and examined by Dr. Kirk Ruths.      Joesphine Bare, P.A. [facility]                      Kirk Ruths, M.D. [facility]    LB/MEDQ  D:  07/18/2003  T:  07/19/2003  Job:  [id number]

## 2010-07-31 NOTE — Discharge Summary (Signed)
NAME:  Katie [name], Katie [name]                          ACCOUNT NO.:  [id number]   MEDICAL RECORD NO.:  [id number]                   PATIENT TYPE:  INP   LOCATION:  49                                 FACILITY:  [facility]   PHYSICIAN:  Ernestine Mcmurray, M.D. [facility]             DATE OF BIRTH:  May 01, 1940   DATE OF ADMISSION:  06/01/2003  DATE OF DISCHARGE:  06/11/2003                                 DISCHARGE SUMMARY   HISTORY OF PRESENT ILLNESS:  This is a 70 year old female with a history of  palpitations and heart fluttering on May 16, 2003.  She has no known  history of heart disease prior to that time.  She was seen by her primary  care doctor earlier this month for shortness of breath, and was found to be  in atrial fibrillation with a rapid ventricular response.  She was  transferred to [facility] and initially treated with IV  Cardizem and then switched to p.o. Cardizem.  She was also anticoagulated.  A 2-D echocardiogram showed an ejection fraction of 40% with moderate global  hypokinesis, as well as inferior akinesis and moderate aortic insufficiency,  moderate mitral regurgitation.  Given segmental wall motion abnormalities on  the echocardiogram, she then underwent a cardiac catheterization which  showed moderate coronary artery disease with an ejection fraction of 35%.  Medical management was recommended with aspirin and a beta blocker.  It was  felt that her cardiomyopathy was non-ischemia.  The shortness of breath  persisted.  She had a history of bronchitis and chronic obstructive  pulmonary disease, as well as active smoking.  Therefore she was seen by Dr.  Legrand Como B. Wert from pulmonary.  He changed her nebulizers to Spiriva and  Advair, as well as a prednisone taper.  She also had a cough, so her ACE  inhibitor was switched to an ORB.  He also recommended a proton pump for  possible reflux.  She was discharged on May 28, 2003.  She had persistent shortness of  breath which increased after being home, and  therefore re-presented on the following day on May 29, 2003.  She was  complaining of shortness of breath and chest pressure.  She was admitted and  a chest x-ray was consistent with mild congestive heart failure.  She was  given IV Lasix and ruled out for a myocardial infarction.  She felt better  the next morning, and was discharged to home in less than 24 hours.  She was  on a dose of Lasix 40 mg at discharge.  She did okay that night, but had an  acute increased shortness of breath on May 31, 2003, at 11 a.m., and she  was told to take extra Lasix, but did not help.  She also complained of  severe orthopnea and was unable to sleep.  She spent the night propped up on  the couch.  She had chest pressure 2/10 in severity which was fairly  constant over the last 24 hours.  She had some cough with no palpitations or  fever, and was readmitted to the hospital.   SECONDARY DIAGNOSES:  1. Atrial fibrillation.  2. Congestive heart failure.  3. Moderate aortic insufficiency.  4. Moderate mitral regurgitation.  5. Chronic bronchitis.  6. Chronic obstructive pulmonary disease.  7. Early coronary artery disease without revascularization.  8. Hypertension.  9. Positive tobacco.  10.      Migraines.  11.      Gastroesophageal reflux disease.  12.      Allergic rhinitis.  13.      Status post cholecystectomy.  14.      Bell's palsy in 1989.  15.      Allergy to CODEINE.   HOSPITAL COURSE:  The patient was admitted.  An INR on admission was 2.9.  Beta natriuretic peptide was 83.  The patient underwent a TEE cardioversion  on June 03, 2003.  She had a normal left atrial appendage.  She failed  cardioversion x3.  She was then placed on amiodarone therapy.  She was seen  by Dr. Ernestine Mcmurray,  who decided on Tikosyn rather than amiodarone.  The  patient had an increased potassium of 6.0.  Apparently had been eating  bananas.  She was instructed  not to eat any more bananas, although a careful  watch would need to be on her potassium.  the patient remained on Tikosyn.  The dosage had been decreased to 125 mcg q.12h.  It had been increased back  up to 250 q.12h.  On June 10, 2003, the patient underwent a successful DC cardioversion with  one biphasic shock of 120 joules.  On March 28th the patient was seen by Dr.  Deboraha Sprang, who agreed with the Tikosyn.  He recommended an increase in  the Lopressor dose and a repeat 2-D echocardiogram in three to six weeks.  The patient remains in sinus rhythm.   DISPOSITION/CONDITION ON DISCHARGE:  The patient was discharged on the  following day in stable condition.   DISCHARGE MEDICATIONS:  1. Mucinex 600 mg, two tab b.i.d.  2. Spiriva 18 mcg daily.  3. Advair one puff b.i.d.  4. Coated aspirin 81 mg daily.  5. Protonix 40 mg daily.  6. Lasix 40 mg daily.  7. Prednisone 10 mg daily, to be tapered by Dr. Melvyn Novas.  8. Tikosyn 250 mg b.i.d.  9. Coumadin 3 mg nightly.  The patient was instructed to take one 2.5 mg tab     as well as 1/2 tab of 1 mg.  10.      Toprol 25 mg daily.  11.      Advair as before.  12.      Diovan 160 mg daily.   DISCHARGE INSTRUCTIONS:  She was to stop her [place].   DIET:  A low-fat, low-salt, low-cholesterol diet.   FOLLOWUP:  1. A follow-up appointment was scheduled with [name] on June 17, 2003,     at 3 p.m.  2. A 2-D echocardiogram to be done that day at 2 p.m.  3. She was to follow with Dr. Melvyn Novas on June 14, 2003, at 11:30 a.m.  4. The patient will also need to be scheduled in the office on Friday for a     BMET.      Forest Becker, C.R.N.P. [facility]  Ernestine Mcmurray, M.D. [facility]    DS/MEDQ  D:  06/11/2003  T:  06/11/2003  Job:  [id number]   cc:   Deboraha Sprang, M.D.   Christena Deem. Melvyn Novas, M.D. [facility]   [name], M.D.  [address] [place]  [place], [place] [address] Fax: [phone]

## 2010-07-31 NOTE — Op Note (Signed)
NAME:  Katie [name], Katie [name]                          ACCOUNT NO.:  [id number]   MEDICAL RECORD NO.:  [id number]                   PATIENT TYPE:  OIB   LOCATION:  2866                                 FACILITY:  [facility]   PHYSICIAN:  Champ Mungo. Lovena Le, M.D.               DATE OF BIRTH:  Apr 16, 1940   DATE OF PROCEDURE:  10/09/2003  DATE OF DISCHARGE:                                 OPERATIVE REPORT   PROCEDURE PERFORMED:  DC cardioversion.   INDICATIONS FOR PROCEDURE:  Persistent atrial fibrillation/flutter.   INTRODUCTION:  The patient is a 70 year old woman with a history of  persistent atrial fibrillation on chronic amiodarone therapy.  Her atrial  fibrillation has been fairly paroxysmal in the past but recently became more  persistent and she was begun on amiodarone.  She has developed atrial  flutter which appeared to be atypical and is now referred for DC  cardioversion.   DESCRIPTION OF PROCEDURE:  After informed consent was obtained, the patient  was placed in the usual position and the electrodispersive pads placed in  the anterior posterior position on the chest and back.  150 mg of sodium  pentothal was given intravenously under the direction of Dr. Oletta Lamas of  anesthesia.  The patient was successfully DC cardioverted with 120 joules of  synchronized biphasic energy which terminated atrial flutter and restored  sinus rhythm.  The patient was allowed to awaken and observed without  particular problem.   COMPLICATIONS:  There were no immediate procedure complications.   RESULTS:  This demonstrates successful DC cardioversion, restoring sinus  rhythm in a patient with persistent atrial fibrillation/flutter on  amiodarone.                                               Champ Mungo. Lovena Le, M.D.    GWT/MEDQ  D:  10/09/2003  T:  10/09/2003  Job:  [id number]   cc:   Ernestine Mcmurray, M.D. [facility]   Christena Deem. Melvyn Novas, M.D. [facility]

## 2010-07-31 NOTE — Discharge Summary (Signed)
NAME:  Katie [name], Katie [name]                          ACCOUNT NO.:  [id number]   MEDICAL RECORD NO.:  [id number]                   PATIENT TYPE:  INP   LOCATION:  42                                 FACILITY:  [facility]   PHYSICIAN:  Ernestine Mcmurray, M.D. [facility]             DATE OF BIRTH:  1941/02/10   DATE OF ADMISSION:  06/01/2003  DATE OF DISCHARGE:  06/11/2003                                 DISCHARGE SUMMARY   DISCHARGE DIAGNOSES:  1. Admitted with severe orthopnea/progressive dyspnea.  2. Multifactorial cause of dyspnea.     A. Cardiac.  Systolic dysfunction/atrial fibrillation.     B. Pulmonary.  Chronic obstructive pulmonary disease plus or minus upper        respiratory infection exacerbation.  3. 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  4. Recalcitrant atrial fibrillation failing direct-current     cardioversion/transesophageal echocardiography March 21.  5. Tikosyn therapy at first in sinus rhythm, then back into atrial     fibrillation and, finally, after a second direct-current cardioversion on     3/28, remains in sinus rhythm.   SECONDARY DIAGNOSES:  1. History of atrial fibrillation.  2. Congestive heart failure.  Ejection fraction 40%.  3. Chronic bronchitis/chronic obstructive pulmonary disease.  4. Early coronary artery disease, no revascularization yet.  5. Hypertension.  6. Migraine headaches.  7. Gastroesophageal reflux disease.  8. Allergic rhinitis.  9. History of Bell's palsy in 1989.  10.      Ankle fracture, remote, on the left ankle.  11.      Active tobacco habituation one and one-half packs per day x 40     years.   PROCEDURES:  1. Transesophageal echocardiogram June 03, 2003:  Normal left atrial     appendage, no thrombus, mild mitral regurgitation, moderate aortic     insufficiency, moderate right ventricular dysfunction, mild to moderate     decreased ejection fraction, failed DCCV, and remains in atrial     fibrillation.  2. Computer tomogram of the chest:  1 cm rounded right lower lobe mass needs     follow-up.  3. June 10, 2003, DCCV with biphasic shock of 120 joules converting to     sinus rhythm, continue Tikosyn.   The patient discharged on March 29 with adjusted medications in continuing  sinus rhythm.   DISCHARGE MEDICATIONS:  1. Mucinex 600 mg twice daily.  2. Spiriva 18 mcg daily.  3. Advair one puff twice daily.  4. Coated aspirin 81 mg daily.  5. Protonix 40 mg a day.  6. Lasix 40 mg daily.  7. Prednisone 10 mg daily.  8. Tikosyn 250 mcg twice daily.  9. Coumadin 3 mg daily.  10.      Toprol 25 mg daily.  11.      Diovan 160 mg daily.   He is to stop taking his [place].   DISCHARGE DIET:  Low-sodium,  low-cholesterol diet.  He will present to the  Coumadin Clinic at [facility] March 31 at 12 noon. A BMET will be taken at that  time.  He is to see [name] April 4 at 3:00 in the afternoon and Dr. Melvyn Novas  April 1 at 11:30 in the morning.   BRIEF HISTORY:  Katie [name] is a 70 year old female.  She had a history of  recalcitrant atrial fibrillation with hospitalizations throughout March  2005.  She was admitted March 19 for a 24-hour period of acute dyspnea  without help from diuretic, Lasix diuresis.  She is experiencing prior to  admission severe orthopnea, slept propped up on a couch overnight.  She has  intermittent diaphoresis, chest pressure 2/10, increased cough, no fever, no  palpitations.  Admission electrocardiogram shows atrial fibrillation at the  rate of 85 beats per minute.   HOSPITAL COURSE:  The patient was admitted to [facility] March 19  with multifactorial dyspnea of both cardiac and pulmonary __________ She was  scheduled immediately for a DC cardioversion to take place March 21.  The  result of the study has been dictated above, and patient remained in atrial  fibrillation after cardioversion.  The patient remained anticoagulated on  Coumadin throughout this  hospitalization.  It was next decided to try  Tikosyn pharmaceutical conversion of her atrial fibrillation.  On March 23,  the patient was started on Tikosyn 500 mcg every 12 hours for pharmacy.  This was later changed to Tikosyn 250 mcg every 12 hours after adjustment  for renal function.  The patient had been on concomitant amiodarone since  TEE-guided DC cardioversion, and the patient, after starting Tikosyn,  experienced some pauses and the amiodarone was discontinued.  By March 25,  the patient had converted to a sinus rhythm after Tikosyn load, the 250  mcg/12 hour dose was decreased to 125 mcg per 12 hours.  Within 24 hours,  however, on March 26, the patient was back in atrial fibrillation.  A second  DC cardioversion attempt was planned, and this was carried out March 18 with  successful conversion to sinus rhythm and maintenance of sinus rhythm.  The  patient goes home on Tikosyn 250 mcg twice daily, continue Coumadin, follow-  up echocardiogram in 3-6 weeks and, also, definitely repeat a computer  tomogram of the chest in three months to follow up on the right lower lobe  mass.      Sueanne Margarita, P.A.                    Ernestine Mcmurray, M.D. [facility]    GM/MEDQ  D:  07/15/2003  T:  07/16/2003  Job:  [id number]   cc:   [name], M.D.  [address], [place]  [place], [place] [address]  Fax: [place] Melvyn Novas, M.D. [facility]

## 2010-07-31 NOTE — Assessment & Plan Note (Signed)
[name]                         ELECTROPHYSIOLOGY OFFICE NOTE   NAME:SNEADAvrie, [name]                       MRN:          [id number]  DATE:06/28/2006                            DOB:          1940-10-08    [name] returns today for followup. 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 The patient recently had had some problems with abdominal pain and  ultimately underwent ultrasound and a CT scan which demonstrated a 3.6  cm abdominal aortic aneurysm. She denies symptoms from this. This  demonstrated a small aneurysm measuring 3 cm near the bifurcation of her  aorta. The patient returns today for followup. She notes that she does  have some dyspnea with exertion but this is not appreciably worse than  it has been in the past and she states that she is now walking 10 miles  per week. She also continues to garden although not at the same  intensity that she once did when she was much lighter and when she was  in sinus rhythm.   PHYSICAL EXAMINATION:  GENERAL:  She is a pleasant, middle-aged woman in  no distress.  VITAL SIGNS:  Blood pressure 127/72, the pulse 55 and irregularly  irregular. Respirations were 18, the weight was 189 pounds.  NECK:  Revealed no jugular venous distention.  LUNGS:  Clear bilaterally to auscultation. There were no wheezes, rales  or rhonchi.  CARDIOVASCULAR:  Irregular bradycardia with normal S1 and S2.  EXTREMITIES:  Demonstrated no cyanosis, clubbing or edema.   Her EKG demonstrates atrial fibrillation.   IMPRESSION:  1. Nonischemic cardiomyopathy thought to be rate related and improved.  2. Chronic atrial fibrillation.  3. History of  tachycardia-induced cardiomyopathy.  4. Recent diagnosis of abdominal aortic aneurysm 3.6 cm.   DISCUSSION:  1. With regard to the patient's A fib, she will continue on with her      present medical therapy.  2. With regard to her heart failure, will continue on with rate      control and p.r.n. Lasix. With  3. With regard to her aneurysm, she will need repeat CT scanning in      approximately 6 months.     Champ Mungo. Lovena Le, MD  Electronically Signed    GWT/MedQ  DD: 06/28/2006  DT: 06/28/2006  Job #: [id number]   cc:   [name], M.D.

## 2010-07-31 NOTE — H&P (Signed)
NAME:  Katie [name], Katie [name]                          ACCOUNT NO.:  [id number]   MEDICAL RECORD NO.:  [id number]                   PATIENT TYPE:  OBV   LOCATION:  36                                 FACILITY:  [facility]   PHYSICIAN:  Larina Earthly. Zenia Resides, M.D. [facility]            DATE OF BIRTH:  [birth year]   DATE OF ADMISSION:  06/01/2003  DATE OF DISCHARGE:                                HISTORY & PHYSICAL   PHYSICIANS:  Cardiologist, Ernestine Mcmurray, M.D.  Primary care physician, [name], M.D.  Pulmonologist, Christena Deem. Melvyn Novas, M.D.   CHIEF COMPLAINT:  Shortness of breath.   HISTORY OF PRESENT ILLNESS:  The patient is a 70 year old female with onset  of palpitations and heart fluttering on May 16, 2003.  She had no known  history of heart disease prior to that time.  She was seen by her primary  care doctor early this month for this shortness of breath and was found to  be in atrial fibrillation with rapid ventricular response.  She was  transferred to [facility] and treated initially with Diltiazem  intravenously then switched to oral Diltiazem.  She was also anticoagulated.  A transthoracic echocardiogram showed an ejection fraction of 40% with  moderate global hypokinesis as well as inferior akinesis with moderate  aortic insufficiency and moderate mitral regurgitation.  Given segmental  wall motion abnormalities on echocardiogram, she then underwent cardiac  catheterization on May 24, 2003, which showed moderate coronary artery  disease with 40 to 50% blockages in a couple of vessels as well as ejection  fraction of 35% and left ventricular end-diastolic pressure of 24.  Medical  management was recommended with aspirin and beta blocker.  It was felt that  her cardiomyopathy was nonischemic.   Her shortness of breath persisted, however.  She has history of bronchitis  and COPD as well as active smoking. Therefore, Dr. Melvyn Novas was consulted for a  pulmonology evaluation.  He changed  her nebulizers to Spiriva and Advair  inhalers as well as prescribed prednisone taper.  She also had some cough,  so her ACE inhibitor was switched to an ARB.  He also recommended proton  pump inhibitor for possible reflux disease.  She was ultimately discharged  to home on May 28, 2003.   She had persistent shortness of breath which increased after being at home  and, therefore, represented the following day on March 16.  She was  complaining of shortness of breath and chest pressure at that time.  She was  admitted overnight.  Chest x-ray was consistent with perhaps mild congestive  heart failure.  She was given IV Lasix.  Rule out myocardial infarction was  negative.  She felt better the next morning and was discharged to home in  less than 24 hours.  She was on a dose of Lasix 40 mg daily p.o. at  discharge. She did okay that night but  then had acute increase in shortness  of breath March 18 at 11 a.m.  She was told to take extra Lasix dose, but it  did not help.  She also complained of severe orthopnea and was unable to  sleep lying down.  She spent the night propped up on her couch. She was also  having intermittent diaphoresis.  She had chest pressure 2/10 in severity  which was fairly constant over the last 24 hours.  She had some cough, no  palpitations and no fevers.  She was direct admitted to the hospital this  afternoon.   PAST MEDICAL HISTORY:  1. Atrial fibrillation.  2. Congestive heart failure.  3. Moderate aortic insufficiency.  4. Moderate mitral regurgitation.  5. Chronic bronchitis/COPD.  6. Early coronary artery disease without revascularization.  7. Hypertension.  8. Active smoker.  9. Migraine headaches.  10.      GERD.  11.      Allergic rhinitis.  12.      Status post cholecystectomy.  13.      Bell's palsy in 1989.  14.      Left ankle fracture in the past.   ALLERGIES:  CODEINE causes change in mental status.   CURRENT MEDICATIONS:  1. Prednisone  20 mg daily taper.  2. Mucinex DM 2 tablets b.i.d.  3. Spiriva 18 mcg inhaler once a day.  4. Advair 250/50 inhaler b.i.d.  5. Aspirin 81 mg daily.  6. Coumadin 2.5 mg nightly.  7. Levaquin antibiotic course which finished yesterday.  8. Protonix 40 mg daily.  9. Diovan 160 mg daily.  10.      Toprol XL 25 mg b.i.d.  11.      Cardizem CD 120 mg daily.  12.      Lasix 40 mg orally daily.  13.      Vitamin C.  14.      Vitamin D.  15.      Calcium.  16.      Imitrex of which she has not taken any for the last 3 months.   SOCIAL HISTORY:  The patient lives in [place] with her husband, Marden Noble.  She also has a daughter who lives in [place].  She formerly worked as a  Educational psychologist.  She smoked one and one-half packs of cigarettes for the last 40  years but has not smoked in three weeks.  She rarely drinks alcohol and has  no alcohol history.  She does not use illicit drugs.   FAMILY HISTORY:  Mother died of cirrhosis.  Father died of unknown causes.   REVIEW OF SYSTEMS:  Positive for sweats, weakness, fever blisters, chest  pressure, shortness of breath, orthopnea, PND, palpitations, cough, nausea,  diarrhea x 2.  Otherwise Review of Systems negative in detail.   ADVANCED DIRECTIVES:  The patient is full code.   PHYSICAL EXAMINATION:  VITAL SIGNS:  Temperature 97.5, pulse 97, blood  pressure 101/63, respirations 20, saturation 98% on room air.  Weight 132  pounds today, down from 145 one week ago.  GENERAL:  The patient was conversant, in no apparent distress.  HEENT:  Pupils were equal.  Mucosa was moist.  Dentition was normal.  Oropharynx not injected.  NECK:  JVP flat.  No carotid bruits.  Thyroid normal.  No lymphadenopathy.  HEART:  Irregularly irregular with normal S1, S2.  No S3 or S4.  She had a  2/6 murmur at the apex which was holosystolic.  There was no evidence  of a  diastolic murmur.  Pulses were 2+ in femorals and PTs.  She had a large ecchymosis in the right groin  but no evidence of active hematoma.  LUNGS:  Few mild wheezes and crackles at the bases, right greater than left.  Her right base was dull relative to the left which was consistent with mild  elevation of the diaphragm on that side.  She was moving air well.  She had  cold sores around her mouth but no other rash.  ABDOMEN:  Soft and nontender.  EXTREMITIES:  Hands were warm.  Feet were mildly cool with no significant  edema.  NEUROLOGIC:  Alert and oriented without neurologic defects.   LABORATORY AND X-RAY DATA:  Chest x-ray on March 9 showed cardiomegaly but  no acute pulmonary process.  She had chest x-ray done March 13 and 17 which  were also negative except for small pleural effusions.   EKG pending at this time.  Prior EKGs have shown atrial fibrillation.   Laboratories from March 18 showed white blood cell count of 11, hematocrit  50.  Sodium 139, potassium 3.9, bicarb 28, BUN 16, creatinine 1.0, glucose  80, calcium 8.9.  LFTs normal.  Troponin 0.01.  BNP 667.  INR 2.9.   Transthoracic echocardiogram from last week showed left ventricular size  within normal limits.  LVEF was 40% with global moderate hypokinesis and  akinesis in the inferior wall.  No LVH.  Moderate AI, moderate MR, moderate  TR.  Mild RV and RA dilation.   Cardiac catheterization from March 11 showed EF of 30 and 35% with EDP of  24, moderate MR and moderate CAD.   IMPRESSION AND PLAN:  A 70 year old female with multifactorial shortness of  breath.  She has now had her third admission in the last two weeks.  This is  likely cardiac from systolic dysfunction compounded by atrial fibrillation  as well as pulmonary causes including chronic obstructive pulmonary disease  with possible upper respiratory tract infection.   1. Systolic failure:  No evidence of active ischemia.  She had a recent     cardiac catheterization that showed no critical stenosis.  However, we     will rule her out here and check EKGs.   Her systolic failure may be     tachycardia mediated related to atrial fibrillation.  We are rate     controlling her current and plan for TEE with cardioversion on Monday.     Her end-diastolic pressure was up at catheterization, but on exam now,     she seems relatively euvolemic.  It would be reasonable to try addition     diuresis and follow her creatinine as well as in's and out's and daily     weights.  Her BNP was mildly elevated a couple of days ago, and we will     recheck it here.  Additionally, she does have significant valvular     disease, and it is reasonable to further evaluate her valves when TEE is     performed.  Ultimately, she is on regimen of beta blocker, ARB, and Lasix     for heart failure treatment.  2. Atrial fibrillation:  As above.  Her rate is borderline high now.  Her     Toprol or her Cardizem could be increased.  Given her heart failure, I     would push her Toprol XL up first.  Additionally, we will plan for    cardioversion  should she not have left atrial thrombus.  She is currently     anticoagulated with INR at goal.  3. Chronic obstructive pulmonary disease:  She is on Spiriva and Advair     inhalers.  These will be continued.  She is on a steroid taper.  She     recently finished an empiric course of antibiotics, and there is no     evidence of current pneumonia.  I have ordered a chest CT with high     resolution cuts to evaluate for other interstitial lung disease     compounding her shortness of breath.  Additionally, this should give some     evidence of the extent of her chronic obstructive pulmonary disease.     Furthermore, when she is stable, PFTs would be appropriate.  4. Consider pulmonary hypertension should above workup be negative.  5. Consider pulmonary embolus:  A D-dimer has been ordered.  6. Fever blisters:  Topical antiherpes medication has been ordered.  7. Active smoking:  The patient was instructed to continue cessation and was      applauded for her effort to quit smoking.                                                Larina Earthly Zenia Resides, M.D. [facility]    LAA/MEDQ  D:  06/01/2003  T:  06/03/2003  Job:  [id number]   cc:   Ernestine Mcmurray, M.D. [facility]   [name], M.D.  [address] [place]  [place], [place] [address]  Fax: [phone]   Christena Deem. Melvyn Novas, M.D. [facility]

## 2010-07-31 NOTE — Discharge Summary (Signed)
NAME:  Katie [name], Katie [name]                          ACCOUNT NO.:  [id number]   MEDICAL RECORD NO.:  [id number]                   PATIENT TYPE:  INP   LOCATION:  2012                                 FACILITY:  [facility]   PHYSICIAN:  Marijo Conception. Wall, M.D.                DATE OF BIRTH:  03-30-1940   DATE OF ADMISSION:  06/12/2003  DATE OF DISCHARGE:  06/21/2003                                 DISCHARGE SUMMARY   DISCHARGE DIAGNOSES:  1. Admitted with resistant, recurrent paroxysmal atrial fibrillation with     rapid ventricular rate, tolerated poorly with symptoms of chest pressure     and diaphoresis.  2. The patient ruled out for myocardial infarction.  3. Recently the patient failed transesophageal echocardiogram     cardioversion/Tikosyn administered during previous hospitalization March     19 to March 29.  4. Converted to sinus rhythm on amiodarone this hospitalization.  5. Hormone anticoagulation.   SECONDARY DIAGNOSES:  1. History of atrial fibrillation.  2. 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  3. Moderate aortic insufficiency.  4. Moderate mitral regurgitation.  5. Chronic bronchitis/severe chronic obstructive pulmonary disease.  6. Ongoing tobacco habituation.  7. Early coronary artery disease by catheterization March 2005 treated with     medical therapy.  8. Ejection fraction 35% on left heart catheterization March 2005.  9. Hypertension.  10.      Migraine headaches.  11.      Gastroesophageal reflux disease.  12.      Allergic rhinitis.  13.      Status post cholecystectomy.  14  History of Bell's palsy in 1989.  1. History of left ankle fracture in the past.   PROCEDURE:  Pulmonary function studies done on June 14, 2003.  This study  shows that the FVC is 108% of predicted.  FEV1 is 100% of predicted.  FEV1/FVC 68%.  FEF 25-75 is 48% of predicted.  Study shows minimal  obstructive lung defect.  Airway obstruction is confirmed by decrease in  flow rate at peak flow and flow at 50% and at 75% of the flow volume curve.  Interpretation:  Mild obstructive airway disease; small airways.  Dr. Annamaria Boots,  practitioner.   DISPOSITION:  Ms. Rubottom is ready for discharge on hospital day #10 after  presentation with recurrent paroxysmal atrial fibrillation with rapid  ventricular response, poorly tolerated.  Upon admission to [facility], she was immediately started on amiodarone 400 mg every 8 hours and  Coumadin adjusted appropriately.  She was ruled out for myocardial  infarction with serial troponin I studies, and her beta blocker was briefly  increased, although this was later decreased back to 25 mg daily.   By postoperative day #5, 2 g amiodarone had been administered, and the  patient was in a sinus rhythm.  It was felt by Dr. Cristopher Peru that a trial  of amiodarone would be a  first line option with failure perhaps suggesting  atrial fibrillation ablation versus A-V node ablation with addition of  permanent pacemaker.  These are all options considered if amiodarone therapy  fails this attempt.   On hospital day #8, she was back in atrial fibrillation with much reduced  rate of 100 to 110, and the patient was asymptomatic.  Twenty-four hours  later, the patient had converted back to sinus rhythm and remains in sinus  rhythm currently.  Her Coumadin dosage has been adjusted to reflect starting  amiodarone therapy, and for a brief time she was subtherapeutic and  maintained on subcutaneous Lovenox.   The patient is asymptomatic on the day of discharge, April 8, and goes home  on the following medications.   DISCHARGE MEDICATIONS:  1. Humibid LA 600 mg 2 tablets twice daily.  2. Spiriva 18 mcg 1 inhalation a day.  3. Advair 1 puff twice a day.  4. Enteric-coated aspirin 81 mg daily.  5. Lasix 40 mg daily.  6. Prednisone 5 mg daily, a new dose.  7. Amiodarone 200 mg tablets, 2 tablets daily.  8. Coumadin 3 mg daily or as  directed by the Coumadin clinic.  9. Avapro 300 mg daily.  10.      Toprol XL 25 mg daily.   DIET:  Low-sodium, low-cholesterol diet.   FOLLOW UP:  1. Dr. Melvyn Novas on April 11 at 1:30 in the afternoon.  2. Coumadin clinic Wednesday, April 13, at 10:30 in the morning.  3. She will present to [facility] Friday, April 22, for an     echocardiogram check.  4. Dr. Lovena Le on Jul 23, 2003, at 4:15 in the afternoon.   BRIEF HISTORY:  [name] is a 70 year old female with a history of  recurrent and resistant paroxysmal atrial fibrillation.  She initially  presented March 3 with atrial fibrillation, underwent left heart  catheterization which showed nonischemic cardiomyopathy, most probably  tachycardic mediated.  She was treated with Cardizem and started on  anticoagulation therapy.  Her ejection fraction during catheterization at  this time was found to be 35%.  She was discharged in sinus rhythm on March  17; however, represented on March 19.  She underwent TEE cardioversion which  failed x 3 and started on Tikosyn with successful followup  direct current cardioversion on March 25.  She went hom on Tikosyn and  Lopressor in sinus rhythm with anticoagulation therapy.  She was readmitted  March 30, this admission, with atrial fibrillation with rapid ventricular  rate despite Tikosyn low-pressure therapy.  She had chest pain and shortness  of breath.  She was admitted for further workup to try to stabilize her  rhythm.   DISCHARGE DISPOSITION:  [name] is ready for discharge April 8.  She is currently in sinus rhythm and has been for the last 48 hours.  She  has been loaded with oral amiodarone for the last eight days and hopefully  will maintain sinus rhythm.  Plans for the future, should she prove  resistant to amiodarone would be atrial fibrillation versus A-V node  ablation with placement of permanent pacemaker.  Also being considered would be a mini-maze procedure or CPI.   She goes home with the medications and  followup as dictated above.   DISCHARGE LABORATORY DATA:  Serum electrolytes on the day of discharge April  8: Sodium 129, potassium 4.4, chloride 95, carbonate 27,  BUN 20, creatinine  1.0, glucose 85.  This hospitalization alkaline  phosphatase was  46, SGOT 26, SGPT 42, urinalysis negative.  BUN was 274.  Her complete blood  count on April 6 was 7.2 white cells, 13.7 hemoglobin, 40.1 hematocrit, 273  platelets.  Pulmonary functions studies are as dictated above.  Her INR at  the time of discharge is 2.6 with a PT of 22.  Coumadin dose is 3 mg daily.      Sueanne Margarita, P.A.                    Thomas C. Wall, M.D.    GM/MEDQ  D:  06/21/2003  T:  06/23/2003  Job:  [id number]   cc:   Champ Mungo. Lovena Le, M.D.   Ernestine Mcmurray, M.D. [facility]   [name], M.D.  [address] [place]  [place], [place] [address]  Fax: [place] Melvyn Novas, M.D. [facility]

## 2010-07-31 NOTE — Cardiovascular Report (Signed)
NAME:  Katie [name], Katie [name]                          ACCOUNT NO.:  [id number]   MEDICAL RECORD NO.:  [id number]                   PATIENT TYPE:  INP   LOCATION:  3729                                 FACILITY:  [facility]   PHYSICIAN:  Junious Silk, M.D. [facility]         DATE OF BIRTH:  12-14-1940   DATE OF PROCEDURE:  05/24/2003  DATE OF DISCHARGE:                              CARDIAC CATHETERIZATION   PROCEDURES PERFORMED:  1. Left heart catheterization with,  2. Coronary angiography and,  3. Left ventriculography.   CARDIOLOGIST:  Junious Silk, M.D.   INDICATIONS:  [name] is a 69 year old woman admitted with atrial  fibrillation and rapid ventricular rate.  An echocardiogram showed  significant decrease in left ventricular systolic function with significant  mitral regurgitation.  An EKG showed changes worrisome for a possible old  anterior infarct.  She was therefore referred for cardiac catheterization.   PROCEDURAL NOTE:  A 5 French sheath was placed in the right femoral artery.  Coronary angiography was performed with 5 [place] JL-4 and JR-4 catheters.  Left ventriculography was performed with an angled pigtail catheter.   CONTRAST MATERIAL:  Contrast used was Omnipaque.   COMPLICATIONS:  There were no complications.   RESULTS:   HEMODYNAMIC DATA:  Left ventricular pressure 140/24.  Aortic pressure  140/80.  There is no aortic valve gradient.   VENTRICULOGRAPHIC DATA:  Left Ventriculogram:  There is global hypokinesis  of the left ventricle.  Ejection fraction is estimated at 30-35%.  There was  2+ mild-to-moderate mitral regurgitation.   ARTERIOGRAPHIC DATA:  Coronary Arteriography (Right Dominant)  Left Main:  Left main is normal.   Left Anterior Descending Artery:  The left anterior descending artery has a  diffuse 20% stenosis in the midvessel.  The LAD gives rise to three small  diagonal branches.   Left Circumflex:  The left circumflex gives rise to a large  bifurcating  first obtuse marginal and small second obtuse marginal branch.  The first  obtuse marginal branch has a 50% stenosis proximally.  After its bifurcation  there is a 40% stenosis in the medial limb and a 30% stenosis in the lateral  limb.   Right Coronary Artery:  The right coronary artery is a dominant vessel.  There is a diffuse 50% stenosis throughout the length of the mid right  coronary artery.  The distal right coronary artery gives rise to a normal-  sized posterior descending artery and three small posterolateral branches.   IMPRESSION:  1. Moderately decreased left ventricular systolic function with global     hypokinesis consistent with a nonischemic cardiomyopathy.  2. Mild-to-moderate mitral regurgitation.  3. Moderate, but nonobstructive coronary artery disease.   PLAN:  The patient will be managed medically for her coronary artery  disease.  She will be started on Coumadin therapy in anticipation of a  direct current cardioversion for atrial fibrillation.  Junious Silk, M.D. [facility]    MWP/MEDQ  D:  05/24/2003  T:  05/24/2003  Job:  [id number]   cc:   [name], M.D.  [address] [place]  [place], [place] [address]  Fax: [phone]   Ernestine Mcmurray, M.D.   [facility]

## 2010-07-31 NOTE — Assessment & Plan Note (Signed)
[facility]                         ELECTROPHYSIOLOGY OFFICE NOTE   NAME:Katie [name], Katie [name]                       MRN:          [id number]  DATE:03/30/2006                            DOB:          [birth year]    [name] returns today for follow-up. 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 She had resolution of her heart failure but continued  dyspnea with maintenance of sinus rhythm on amiodarone.  The patient  unfortunately was subsequently diagnosed with amiodarone lung toxicity  and her amiodarone had to be discontinued. I saw her back in June and  she was maintaining sinus rhythm and stable. She was seen by Dr. Elsie Lincoln back in December and was found to be in atrial fibrillation, and  is now referred back for additional evaluation. The patient's TSH was  within normal limits at that time. Her BNP was slightly elevated.   PHYSICAL EXAMINATION:  GENERAL: On exam today she is a pleasant, well-  appearing middle-aged woman in no acute distress.  VITAL SIGNS: Blood pressure today was 117/80, the pulse was 110 and  irregularly irregular, respirations were 18. Weight 192 pounds.  NECK: Revealed no jugular venous distension.  LUNGS: Clear bilaterally to auscultation, there are no wheezes, rales or  rhonchi.  CARDIOVASCULAR: Revealed an irregularly rate and rhythm with normal S1,  S2.  EXTREMITIES: Demonstrated no cyanosis, clubbing or edema.   EKG demonstrates atrial fibrillation. Respiration was 122 milliseconds.   IMPRESSION:  1. Persistent atrial fibrillation.  2. Chronic Coumadin therapy.  3. History of tachycardia induced cardiomyopathy.  4. Amiodarone induced lung disease.   DISCUSSION:  [name] has returned to atrial fibrillation after long  ago discontinuing her amiodarone. Fortunately she is not as  symptomatic  as she had previously been when she was in atrial fibrillation. For this  reason my recommendation will be that we up titrate her AV nodal  blocking drugs and to this end I have asked that she increase her dose  of Lopressor to 50 twice a day and will start her on digoxin 0.25 mg  daily. Will see her back for an EKG in 3 weeks.  Will also obtain a  digoxin level at that time. I will see her back in several months. She  is instructed to call us if she feels dizzy or lightheaded.    Champ Mungo. Lovena Le, MD  Electronically Signed   GWT/MedQ  DD: 03/30/2006  DT: 03/30/2006  Job #: [id number]   cc:   Leonides Grills, M.D.

## 2010-07-31 NOTE — Consult Note (Signed)
NAME:  Katie [name], Katie [name]                          ACCOUNT NO.:  [id number]   MEDICAL RECORD NO.:  [id number]                   PATIENT TYPE:  INP   LOCATION:  3729                                 FACILITY:  [facility]   PHYSICIAN:  Christena Deem. Melvyn Novas, M.D. [facility]           DATE OF BIRTH:  [birth year]   DATE OF CONSULTATION:  05/25/2003  DATE OF DISCHARGE:                                   CONSULTATION   REASON FOR CONSULTATION:  Unexplained dyspnea, question COPD.   HISTORY:  This is a 70 year old white female, active smoker, with history of  dyspnea going back at least two years with exertion, but for the last three  weeks she has been fighting off a bronchitis with hacking congested cough,  dyspnea, and palpitations.  She was admitted with new onset atrial  arrhythmias and had undergone a Cardiac evaluation that reveals nonischemic  cardiomyopathy with a reduced ejection fraction and LV EDP of 24.  She does  give a history of orthopnea over the last three weeks and a sensation of  chest congestion, but this bronchitis that she has is different than  usual as she really has not had increased sputum production, just the  sensation that she needs to cough something up and no real purulent  sputum, fevers, chills, sweats, sore throat, headache, fevers, myalgias.  She has already been treated with Zithromax and Levaquin without any benefit  and also has been placed on Xopenex and prednisone and only feels a little  if any better.   PAST MEDICAL HISTORY:  1. COPD felt secondary to active smoking.  2. Hypertension.  3. Migraine headaches.  4. Allergic rhinitis.  5. GERD.  6. Status post remote cholecystectomy.  7. Bell's palsy in 1989.  8. Left ankle fracture remotely.   ALLERGIES:  CODEINE causes nausea.   MEDICATIONS:  She is listed as taking Aldactone, Xopenex, prednisone,  Levaquin, Aleve.  Here she is on Atrovent q.6h., Capoten 300 mg daily,  Avelox 400 mg daily since March 9, Coumadin,  Ecotrin, Humibid DM 2 b.i.d.,  nystatin mouthwash, Protonix 40 mg daily, and Toprol.   SOCIAL HISTORY:  She is an active smoker as noted above.  She has worked as  a Educational psychologist.   FAMILY HISTORY:  Positive for cirrhosis in her mother, negative for  respiratory diseases to her knowledge.  She does not know anything about her  father's health.   REVIEW OF SYSTEMS:  Significant for orthopnea as noted above but minimal leg  swelling.  Otherwise negative.  No significant fevers, chills, sweats,  unintentional weight loss, change in bowel or bladder habits.   PHYSICAL EXAMINATION:  GENERAL:  This is a slightly anxious but pleasant  white female who actually appears much older than stated age, perhaps as  much as 96 years older than stated age.  VITAL SIGNS:  Afebrile with normal vital signs.  HEENT:  Unremarkable.  Pharynx clear,  dentition intact.  NECK:  Supple without cervical adenopathy or tenderness.  Trachea is  midline, no thyromegaly.  HEART:  Regular rhythm with a 1/6 systolic murmur.  CHEST:  Hyperresonant to percussion with positive Hoover's sign at the end  of inspiration.  There is minimal true wheezing, however.  ABDOMEN:  Mildly obese but benign.  EXTREMITIES: Warm without calf tenderness, cyanosis, or clubbing.  There was  trace edema.   LABORATORY AND X-RAY DATA:  Bicarb level 23.  Hematocrit 46.5%.  TSH was  normal.   Chest x-ray revealed mild cardiomegaly, small bilateral pleural effusions,  moderate COPD.   IMPRESSION:  Multifactorial dyspnea.  Given the absence of excessive sputum  production or purulent sputum production, I do not believe we can label this  as a typical COPD exacerbation and would consider this chronic obstructive  pulmonary disease (COPD) with atypical asthma with the following  differential diagnoses.  1. First and foremost, she probably has an element of heart failure with     elevated LV EDP.  The fact that she is hyperinflated on x-ray hides  to     some extent interstitial edema that otherwise be apparent, but note that     she does have small pleural effusions and would benefit from continued     aggressive treatment directed at lowering her afterload and diuresis.  2. 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  I would recommend a sinus CT scan, aggressive treatment for     gastroesophageal reflux disease, and switch her from ACE inhibitors over     to ARB therapy empirically, at least in the short run.  I would also     empirically diurese her and repeat a PA and lateral chest x-ray.  3. In terms of treating her COPD aggressively, I would continue Xopenex,     Humibid DM, teach her flutter valve technique, and give her a short     course of steroids to establish a best day pattern of breathing before     attempting to discharge her on oral therapy.  4. For specific recommendations, please see handwritten orders.                                               Christena Deem. Melvyn Novas, M.D. [facility]    MBW/MEDQ  D:  05/25/2003  T:  05/27/2003  Job:  [id number]   cc:   Tammi Sou, M.D.  Fax: [phone]   Loretha Brasil. Lia Foyer, M.D. [facility]

## 2010-07-31 NOTE — Discharge Summary (Signed)
NAME:  Katie [name], Katie [name]                          ACCOUNT NO.:  [id number]   MEDICAL RECORD NO.:  [id number]                   PATIENT TYPE:  INP   LOCATION:  3729                                 FACILITY:  [facility]   PHYSICIAN:  Kirk Ruths, M.D. [facility]            DATE OF BIRTH:  May 28, 1940   DATE OF ADMISSION:  07/18/2003  DATE OF DISCHARGE:  07/21/2003                                 DISCHARGE SUMMARY   HISTORY OF PRESENT ILLNESS:  Ms. Ballantine is a 70 year old female with history  of recurrent atrial arrhythmias, COPD, nonischemic cardiomyopathy,  hypertension, and nonobstructive coronary disease, who presented to [facility] on Jul 18, 2003, with complaints of chest tightness, dyspnea on  exertion, and orthopnea. She had been seen fairly recently in the office,  found to be in atrial flutter, and was told to continue the amiodarone load.  However, symptoms worsened, and she presented to the emergency room.  Upon  admission, she was noted to be in atrial flutter with rapid ventricular rate  with a new left bundle branch block by EKG.   PAST MEDICAL HISTORY:  As noted above with the addition of gastroesophageal  reflux disease, history of migraines, and history of Bell's palsy and status  post cholecystectomy.   ALLERGIES:  CODEINE.   SOCIAL HISTORY:  The patient lives in [place] with her husband, and she  has one daughter.  No history of tobacco or alcohol use.   HOSPITAL COURSE:  The patient was admitted on May5, 2005, as noted above.  On Jul 19, 2003, it was planned to proceed with direct current cardioversion.  This was successful in converting her to normal sinus rhythm.  The patient  has maintained sinus rhythm since cardioversion.  INR was supratherapeutic.  Coumadin has been held on Jul 20, 2003, and went from 5.2 to 4.4 today.   The patient was seen on Jul 21, 2003, by Dr. Stanford Breed and felt to be stable  for discharge.  Recommendation is were to hold Coumadin and  resume tomorrow  the dose of 1 mg alternating with 2 mg every day.  Check INR Wednesday and  follow up with Dr. Lovena Le as scheduled.  He also recommends that patient  continue on prednisone taper for COPD and to have outpatient evaluation for  dysphagia.  Will also have repeat BMET in one week.   LABORATORY AND X-RAY DATA:  Today, basic metabolic panel shows sodium 128,  potassium 4.8, chloride 98, CO2 28, glucose 91, BUN 18, creatinine 1.1,  calcium 8.9.  She did have slightly elevated SGOT of 62 and SGPT at 69 on  May 5.  During hospitalization cardiac enzymes x 3 were not consistent with  acute myocardial infarction.  B-natruretic peptide level yesterday was  137.6.  She did have an elevated TSH level of 8.198.  this should be further  evaluated as an outpatient.   Chest x-ray on  Jul 18, 2003, revealed no acute cardiopulmonary findings, mild  cardiac enlargement, and COPD changes.   EKG on admission revealed atrial flutter at rate of 117 beats per minute  with left bundle branch block.  EKG today reveals sinus rhythm at 60 beats  per minute with left ventricular hypertrophy and repolarization abnormality.  She also had poor R wave progression.  Normal PR interval and normal QRS  duration.   DISCHARGE MEDICATIONS:  1. Coumadin.  No Coumadin today. She will start taking 1 mg tomorrow and     alternate with 2 mg every day.  She is to have INR checked on Wednesday.  2. Amiodarone 400 mg daily.  3. Wellbutrin 150 mg b.i.d.  4. Diovan 160 mg daily.  5. Aspirin 81 mg daily.  6. Lasix 40 mg daily.  7. Metoprolol 25 mg b.i.d.  8. Prednisone taper 40 mg x 2 days, 30 mg x 2 days, 20 mg x 1 day, and 10 mg     x 1 day.   DISCHARGE INSTRUCTIONS:  1. The patient is to take Tylenol as needed for pain.  2. Activity level as tolerated.  3. The patient is to follow a low-fat, low-cholesterol diet.  4. She will follow up with Dr. Cristopher Peru on Friday as Scheduled.  5. She will need pro time  checked on Wednesday.  6. She will also need to have a GI evaluation scheduled for evaluation of     dysphagia and possible EGD.  7. She will need a BMET in one week.  8. Consideration should be given to repeating thyroid function studies as     her TSH level was elevated during hospitalization.      Amy Nelida Gores, P.A. [facility]                     Kirk Ruths, M.D. [facility]    AB/MEDQ  D:  07/21/2003  T:  07/22/2003  Job:  [id number]   cc:   Champ Mungo. Lovena Le, M.D.

## 2010-07-31 NOTE — Discharge Summary (Signed)
NAME:  Katie [name], Katie [name]                          ACCOUNT NO.:  [id number]   MEDICAL RECORD NO.:  [id number]                   PATIENT TYPE:  INP   LOCATION:  3729                                 FACILITY:  [facility]   PHYSICIAN:  Ernestine Mcmurray, M.D. [facility]             DATE OF BIRTH:  1941-01-08   DATE OF ADMISSION:  05/22/2003  DATE OF DISCHARGE:  05/28/2003                                 DISCHARGE SUMMARY   PROCEDURES:  1. Cardiac catheterization.  2. Coronary arteriogram.  3. Left ventriculogram.  4. A 2-D echocardiogram.   HOSPITAL COURSE:  Katie [name] is a 70 year old female with no known history of  coronary artery disease.  She has a history of tobacco use and a history of  chronic bronchitis.  She was seen by her family physician for palpitations  and heart fluttering that began six days prior to admission.  She had no  chest pain but increased dyspnea.  Dr. Melford Aase noted her heart to be rapid  and irregular and she was transferred to [facility] for  further evaluation and treatment.   Katie [name] was in atrial fibrillation with rapid ventricular response and her  rate was controlled with IV Cardizem.  Once she stabilized at a dosage of 5  mg per hour, she was changed to p.o. at 120 mg daily and tolerated this  well.  Katie [name] was anticoagulated and cross covered from heparin to  Coumadin during this admission.  She received two doses of 5 mg Coumadin,  then a dose of 0.5 mg, then a dose of 2.0 mg.  Her INR climbed rapidly to  2.5 at discharge.  She is to get an INR follow-up on Friday, go home on 2.5  mg daily.   A 2-D echocardiogram  was ordered and performed.  Her EF was 35 to 45% with  moderate diffuse left ventricular hypokinesis.  Because of this, cardiac  catheterization was performed on May 24, 2003.  It showed moderate  nonobstructive coronary artery disease and medical therapy was recommended.  She had a baby aspirin and a beta-blocker added to her  medication regimen  and had no increased wheezing with this.   Pulmonary consult was called for shortness of breath and history of  bronchitis.  She was seen by Dr. Melvyn Novas. Her medications were adjusted and she  was taken off nebulizers and put on Spiriva and Advair.  She is also on a  prednisone taper.  She is to follow up with Dr. Melvyn Novas next week in the office  and complete course of antibiotics as well.  Additionally, she was felt to  have reflux symptoms which could also possibly contribute to her cough and  had been started on an ACE inhibitor.  The ACE inhibitor was discontinued  and she had a proton pump inhibitor added to her medication regimen.  She is  to follow up  in the office.   On the echocardiogram, moderate AI and moderate MR were also noted and these  will be followed as an outpatient.  She is to have antibiotic prophylaxis  before any procedures.   By May 28, 2003, Katie [name] was ambulating without chest pain or shortness  of breath.  Her heart rate was under good control although she was still in  atrial fibrillation.  Katie [name] was considered stable for discharge on May 28, 2003, with outpatient follow-up arranged.   CONDITION ON DISCHARGE:  Improved.   DISCHARGE DIAGNOSES:  1. Atrial fibrillation with rapid ventricular response.  2. Moderate coronary artery disease by catheterization this admission.  3. Left ventricular dysfunction with an ejection fraction of 35 to 45% by     echocardiogram  this admission.  4. History of tobacco use.  5. History of chronic obstructive pulmonary disease and chronic bronchitis.  6. Hypertension.  7. History of migraine headaches.  8. History of allergic rhinitis.  9. Gastroesophageal reflux disease symptoms.  10.      Valvular disease with moderate AI and mitral regurgitation by     echocardiogram.  11.      Right pleural effusion which has largely resolved.  12.      Anticoagulation with Coumadin begun this admission.   13.      Upper respiratory tract infection.   DISCHARGE INSTRUCTIONS:  1. Her activity level is to be as tolerated.  2. She is to take Tylenol only for pain.  3. She is to stick to a low fat diet.  4. She is to call the office for problems with catheterization site.  5. She is to follow up at Coumadin clinic on May 31, 2003, which is     Friday, at 10:30.  6. She is to follow up with Dr. Melvyn Novas next week.  7. She is to follow up with Dr. Melford Aase as scheduled.  8. She is to follow up with [name] on June 17, 2003, at 3 p.m.   DISCHARGE MEDICATIONS:  1. Prednisone 10 mg two tablets daily.  2. Mucinex DM two tablets q.12h.  3. Spiriva 18 mcg one inhaled tablet per day.  4. Advair 250/50 one puff b.i.d.  5. Aspirin 81 mg daily.  6. Coumadin 2.5 mg daily.  7. Complete Levaquin that she was on prior to admission.  8. Protonix 40 mg daily.  9. Diovan 160 mg daily.  10.      Vitamin C, D, and calcium as prior to admission.  11.      Toprol XL 25 mg b.i.d.  12.      Cardizem CD 120 mg daily.  13.      She is not to take Aleve, Histinex or vitamin E for now.  (The patient states that affording these medications is going to be a  significant problem, so any decreased cost or generic medications that can  be used, she would appreciate).      Rosaria Ferries, P.A. [facility]                  Ernestine Mcmurray, M.D. [facility]    RB/MEDQ  D:  05/28/2003  T:  05/30/2003  Job:  [id number]   cc:   [name], M.D.  [address] [place]  [place], [place] [address]  Fax: [place] Melvyn Novas, M.D. [facility]   Coumadin Clinic

## 2010-07-31 NOTE — Assessment & Plan Note (Signed)
[name]                         ELECTROPHYSIOLOGY OFFICE NOTE   [name], [name]                       MRN:          [id number]  DATE:04/25/2006                            DOB:          08/09/1940    [name] returns today for followup. 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 She had maintained sinus rhythm on the amiodarone. 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 She returns  today for followup and overall feels well. She has far few symptoms of A  fib now. She does note that with extraneous exertion, she will get some  dyspnea and shortness of breath and chest pressure. This always resolves  with rest. It is very mild.   PHYSICAL EXAMINATION:  GENERAL:  She is a pleasant 70 year old woman in  no acute distress.  VITAL SIGNS: The blood pressure today was 116/78, the pulse was 75 and  irregularly irregular. The respirations were 18 and the weight was 192  pounds.  NECK:  Revealed no jugular venous distention.  LUNGS:  Clear bilaterally to auscultation. There were no wheezes, rales  or rhonchi.  CARDIOVASCULAR:  Revealed an irregularly irregular rhythm with normal S1  and S2.  EXTREMITIES:  Demonstrated no edema.   IMPRESSION:  1. Chronic atrial fibrillation.  2. Chronic Coumadin therapy.  3. History of tachycardia-induced cardiomyopathy now resolved.  4. Amiodarone induced lung toxicity.   DISCUSSION:  Overall, Katie [name] is stable. Her rate is well controlled  today after having come in for an EKG last week where her rate was far  too slow at 44 beats per minute. Today she is at 62 in A fib and feels  well. I have asked that she continue to exercise and continue her  medications which include metoprolol 50  mg twice daily, digoxin 0.25  daily, Lasix 40 daily, and Pravachol. Also she is on low-dose aspirin. I  will see her back in 6 months or sooner should she have additional  problems.     Champ Mungo. Lovena Le, MD  Electronically Signed    GWT/MedQ  DD: 04/25/2006  DT: 04/25/2006  Job #: [id number]   cc:   Leonides Grills, M.D.
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
## 2010-07-31 NOTE — Discharge Summary (Signed)
NAME:  Katie [name], Katie [name]                          ACCOUNT NO.:  [id number]   MEDICAL RECORD NO.:  [id number]                   PATIENT TYPE:  INP   LOCATION:  2028                                 FACILITY:  [facility]   PHYSICIAN:  Ernestine Mcmurray, M.D. [facility]             DATE OF BIRTH:  Sep 25, 1940   DATE OF ADMISSION:  05/30/2003  DATE OF DISCHARGE:  05/31/2003                                 DISCHARGE SUMMARY   PROCEDURE:  None.   HOSPITAL COURSE:  [name] is a 70 year old female with a history of  nonobstructive coronary artery disease as well as CHF and atrial  fibrillation.  On the a.m. of admission, she had the sudden onset of  shortness of breath as well as orthopnea and chest pressure.  This was  associated with diaphoresis but no nausea and vomiting.  She came to the  hospital but has been symptom free.  She went home on May 28, 2003, for  similar symptoms.  She was doing well except for dyspnea on exertion but no  chest pressure, extreme shortness of breath, or palpitations until the day  of admission.  She was admitted to rule out MI and for further evaluation  and control of her heart rate.   Her BMP was elevated and [name] felt that she had some volume overload  and needed some diuresis.  On May 31, 2003, she was seen by [name].  Her enzymes were negative for MI and her atrial fibrillation was rate  controlled.  [name] evaluated her and felt  that she was OK for  discharge on Lasix.  He scheduled her to come back to the hospital for a TEE  and also for follow up with the Coumadin clinic.  [name] was considered  stable for discharge on May 31, 2003, with outpatient follow up arranged.      Rosaria Ferries, P.A. [facility]                  Ernestine Mcmurray, M.D. [facility]    RB/MEDQ  D:  09/19/2003  T:  09/20/2003  Job:  [id number]

## 2010-07-31 NOTE — Consult Note (Signed)
NAME:  Katie [name], Katie [name]                          ACCOUNT NO.:  [id number]   MEDICAL RECORD NO.:  [id number]                   PATIENT TYPE:  INP   LOCATION:  2012                                 FACILITY:  [facility]   PHYSICIAN:  Champ Mungo. Lovena Le, M.D.               DATE OF BIRTH:  [birth year]   DATE OF CONSULTATION:  06/13/2003  DATE OF DISCHARGE:                                   CONSULTATION   REFERRING PHYSICIAN:  Dr. Marijo Conception. [place].   REASON FOR CONSULTATION:  Atrial fibrillation with a rapid ventricular  response.   HISTORY OF PRESENT ILLNESS:  This is a 70 year old female with a past  medical history of COPD and bronchitis, who was recently admitted on May 16, 2003 for complaint of shortness of breath and tachy-palpitations.  She  was transferred to [facility] and noted to be in atrial  fibrillation.  She was started on IV Cardizem, which was changed to p.o.,  and anticoagulated.  Her ejection fraction was noted to be 40% with global  hypokinesis.  Cardiac catheterization was done which showed LAD -- diffuse  20% mid; left circumflex -- OM-1 50% at bifurcation, 40% mid, 30% lateral;  RCA -- 50% diffuse; EF 35%.  She was to be treated with medical management.  Shortness of breath persisted.  She was seen by Dr. Legrand Como B. Wert and  treated with mini-nebulizers and prednisone.  She has an appointment for a  followup with Dr. Melvyn Novas next week.  She was again readmitted on June 01, 2003, 4 days after discharge, with a complaint of chest pain and shortness  of breath.  She ruled out for an MI.  She was started on Tikosyn therapy for  atrial fibrillation with rapid ventricular response.  She underwent  successful D-C cardioversion and was discharged.  On June 11, 2003, she was  now awoken in the morning with a complaint of diaphoresis, chest pressure  and tachy-palpitations with a heart rate of 140.  She presented to the  emergency room and was noted to be in atrial  fibrillation.  Thromboembolic  risk factors are hypertension, cardiomyopathy, CHF and CAD.   PAST MEDICAL HISTORY:  Past medical history positive for:  1. Catheterization in March 2005, results as stated above.  2. Bronchitis.  3. COPD.  4. Hypertension.  5. CHF.  6. Atrial fibrillation, new in March of 2005.  7. Bell's palsy in 1989.  8. Migraine headaches.  9. Minor AI.  10.      LVEF is 40%.   PAST SURGICAL HISTORY:  Past surgical history is positive for  cholecystectomy.   ALLERGIES:  States allergies to CODEINE.   MEDICATIONS:  1. Mucinex 600 mg 2 tabs b.i.d.  2. Spiriva 18 mcg daily.  3. Advair 250/50 mcg 1 puff b.i.d.  4. Enteric-coated aspirin 81 mg daily.  5. Lasix 40 mg daily.  6.  Prednisone 10 mg daily.  7. Tikosyn 250 mg q.12 h.  8. Toprol-XL 25 mg a day.  9. Diovan 160 mg a day.  10.      Coumadin 3 mg daily.   SOCIAL HISTORY:  She lives in [place] with her husband.  She works as a  Educational psychologist.  She is married.  Tobacco:  One and a half packs a day for 35  years; she quit 5 weeks ago.  Denies alcohol, drugs or herbal medications.  Denies exertional chest discomfort.   FAMILY HISTORY:  Mother is deceased at 61 with alcohol cirrhosis.  Father is  deceased in his 21s of old age.   REVIEW OF SYSTEMS:  CONSTITUTIONAL:  Denies fevers, chills, sweats or weight  changes.  HEENT:  Denies headache, nasal discharge, bleeding, voice change  or vertigo.  SKIN:  Denies rash or lesions.  CARDIOPULMONARY:  Positive  chest pain, shortness of breath, dyspnea on exertion.  Denies presyncope,  syncope.  Positive palpitations.  MS:  Denies arthralgias, joint swelling,  deformity or pain.  GU:  Denies frequency, urgency, hematuria or dysuria.  Positive menopausal.  NEUROLOGIC/PSYCHIATRIC:  Positive weakness.  GI:  No  nausea, vomiting or diarrhea.  ENDOCRINE:  Denies polyuria, polydipsia or  skin/hair/nail changes or heat or cold intolerances.   PHYSICAL EXAM:  VITAL SIGNS:   Pulse 140, respirations 18, blood pressure  109/42, 97.7.  O2 saturation 99% on room air.  Weight is 138 pounds.  GENERAL:  This is a well-developed 70 year old female in no apparent  distress.  HEENT:  Normocephalic, atraumatic.  Pupils equal, round and reactive to  light.  EOMs intact.  Sclerae are clear.  NECK:  Neck is supple and nontender.  No bruits.  No thyromegaly.  JVD is  approximately 5.  CARDIOVASCULAR:  Heart rate is irregular without rub or gallop.  PMI is  slightly displaced to the left.  LUNGS:  Lungs are clear to auscultation, bilateral.  ABDOMEN:  Abdomen soft, round and nontender.  Normoactive bowel sounds.  EXTREMITIES:  Extremities show no cyanosis, clubbing or edema.  NEUROLOGIC:  Awake, alert and oriented x3.  MS:  No joint deformity, effusions, kyphosis or scoliosis.   LABORATORY AND ACCESSORY CLINICAL DATA:  EKG shows a rate of 131, atrial  fibrillation, normal axis.  QRS duration is 88 with a QTc of approximately  480.   INR is 2.2.  CK 24, MB 1.8, troponin 0.02.  Sodium 136, potassium 4.4,  chloride 102, CO2 29, glucose 166, BUN 16, creatinine 0.9.  WBC 9.6,  hemoglobin and hematocrit are 15.3 and 45.1 with a platelet count of  281,000.   ASSESSMENT AND PLAN:  1. Atrial fibrillation with rapid ventricular response, on Tikosyn since     June 10, 2003, status post direct-current cardioversion into normal     sinus rhythm, discharged home on June 11, 2003, now presents in atrial     fibrillation, cardiomyopathy, ejection fraction 35% to 40%, possibly     secondary to rate, for repeat echocardiogram in 6 weeks after normal     sinus is maintained.  2. Coronary artery disease:  Medical management.  3. Chronic obstructive pulmonary disease:  Follow with Dr. Melvyn Novas.  4. Congestive heart failure secondary to #1 and 2.  5. Hypertension.   Assessment and plan as per Dr. Champ Mungo. Taylor:  1. Recurrent atrial fibrillation with rapid ventricular response,     tachycardia-mediated cardiomyopathy with Tikosyn failure.  2. Chronic obstructive pulmonary disease,  unclear of how bad.   Dr. Lovena Le had discussed the different treatment options with the patient  and her family.  He thinks that amiodarone versus rate control versus atrial  fibrillation ablation versus atrioventricular nodal ablation and permanent  pacemaker are all options.  Amiodarone is the next best option, unless  chronic obstructive pulmonary disease is really bad.  He plans to discuss  this with Dr. Ernestine Mcmurray and Dr. Melvyn Novas, for now stop Tikosyn and treatment  with beta blockers and Cardizem and continue Coumadin therapy.     Forest Becker, C.R.N.P. [facility]                 Champ Mungo. Lovena Le, M.D.    DS/MEDQ  D:  06/13/2003  T:  06/14/2003  Job:  [id number]   cc:   Champ Mungo. Lovena Le, M.D.   Ernestine Mcmurray, M.D. [facility]

## 2010-08-08 ENCOUNTER — Encounter: Payer: Self-pay | Admitting: Internal Medicine

## 2010-08-20 ENCOUNTER — Other Ambulatory Visit: Payer: Self-pay | Admitting: Cardiology

## 2010-08-20 DIAGNOSIS — I714 Abdominal aortic aneurysm, without rupture: Secondary | ICD-10-CM

## 2010-08-21 ENCOUNTER — Ambulatory Visit (INDEPENDENT_AMBULATORY_CARE_PROVIDER_SITE_OTHER): Payer: Medicare Other | Admitting: Internal Medicine

## 2010-08-21 ENCOUNTER — Encounter (INDEPENDENT_AMBULATORY_CARE_PROVIDER_SITE_OTHER): Payer: Medicare Other | Admitting: Cardiology

## 2010-08-21 ENCOUNTER — Encounter: Payer: Self-pay | Admitting: Internal Medicine

## 2010-08-21 DIAGNOSIS — I10 Essential (primary) hypertension: Secondary | ICD-10-CM

## 2010-08-21 DIAGNOSIS — I509 Heart failure, unspecified: Secondary | ICD-10-CM

## 2010-08-21 DIAGNOSIS — I714 Abdominal aortic aneurysm, without rupture: Secondary | ICD-10-CM

## 2010-08-21 DIAGNOSIS — I4891 Unspecified atrial fibrillation: Secondary | ICD-10-CM

## 2010-08-21 DIAGNOSIS — I5043 Acute on chronic combined systolic (congestive) and diastolic (congestive) heart failure: Secondary | ICD-10-CM

## 2010-08-21 NOTE — Patient Instructions (Signed)
Your physician wants you to follow-up in: 9 months with Dr Knox Saliva will receive a reminder letter in the mail two months in advance. If you don't receive a letter, please call our office to schedule the follow-up appointment.

## 2010-08-21 NOTE — Assessment & Plan Note (Signed)
Her blood pressure is fairly well controlled. I've asked her to maintain a low-sodium diet and continue her current medications.

## 2010-08-21 NOTE — Assessment & Plan Note (Signed)
Her symptoms are class II. She will continue her current medications. I've asked her to maintain a low-sodium diet. Her

## 2010-08-21 NOTE — Assessment & Plan Note (Signed)
Her ventricular rates are for the most part controlled. She has been taken off of her beta blocker. Will undergo a period of watchful waiting.

## 2010-08-21 NOTE — Progress Notes (Signed)
HPI Katie [name] returns today for followup. She is a very pleasant 69 year old woman with a history of chronic atrial fibrillation, dilated cardiomyopathy, amiodarone-induced lung toxicity, abdominal aortic aneurysm, and peripheral vascular disease. Despite her multiple problems, she has remained active. She works in her garden. She denies chest pain. She has rare palpitations. No syncope. She does have mild claudication. When her heart races, she feels short of breath when her heart rate is increased. Allergies  Allergen Reactions  . Codeine      Current Outpatient Prescriptions  Medication Sig Dispense Refill  . allopurinol (ZYLOPRIM) 300 MG tablet Take 150 mg by mouth daily.        Marland Kitchen aspirin 81 MG tablet Take 81 mg by mouth daily.        . Calcium Carbonate POWD Take 1 tablet by mouth daily.        . Cholecalciferol 5000 UNITS capsule Take 5,000 Units by mouth daily.        . digoxin (LANOXIN) 0.25 MG tablet Take 250 mcg by mouth daily.        . furosemide (LASIX) 80 MG tablet Take 80 mg by mouth. 1/2 po daily      . Glucosamine 500 MG CAPS Take by mouth.       . levothyroxine (SYNTHROID, LEVOTHROID) 50 MCG tablet Take 50 mcg by mouth daily.        Marland Kitchen olmesartan (BENICAR) 20 MG tablet Take 20 mg by mouth daily.        . pravastatin (PRAVACHOL) 40 MG tablet Take 40 mg by mouth at bedtime.        Marland Kitchen tiotropium (SPIRIVA) 18 MCG inhalation capsule Place 18 mcg into inhaler and inhale daily.        Marland Kitchen warfarin (COUMADIN) 2 MG tablet Take 2 mg by mouth daily.        Marland Kitchen DISCONTD: magnesium oxide (MAG-OX) 400 MG tablet Take 400 mg by mouth daily.        Marland Kitchen DISCONTD: metoprolol (TOPROL-XL) 50 MG 24 hr tablet Take 50 mg by mouth daily.           Past Medical History  Diagnosis Date  . Atrial fibrillation   . COPD (chronic obstructive pulmonary disease)   . Bronchitis, chronic   . History of Bell's palsy   . GERD (gastroesophageal reflux disease)   . Hypertension   . Coronary artery disease   .  Migraine headache   . Atrial flutter   . Tobacco abuse     ROS:   All systems reviewed and negative except as noted in the HPI.   Past Surgical History  Procedure Date  . Cholecystectomy      Family History  Problem Relation Age of Onset  . Cirrhosis Mother      History   Social History  . Marital Status: Married    Spouse Name: N/A    Number of Children: N/A  . Years of Education: N/A   Occupational History  . Not on file.   Social History Main Topics  . Smoking status: Former Research scientist (life sciences)  . Smokeless tobacco: Not on file   Comment: History of tobacco abuse  . Alcohol Use: No  . Drug Use: No  . Sexually Active: Not on file   Other Topics Concern  . Not on file   Social History Narrative   Lives in [place] with husbandOne daughter     BP 130/60  Pulse 60  Resp 16  Ht 5'  4" (1.626 m)  Wt 189 lb (85.73 kg)  BMI 32.44 kg/m2  Physical Exam:  Well appearing NAD HEENT: Unremarkable Neck:  No JVD, no thyromegally Lymphatics:  No adenopathy Back:  No CVA tenderness Lungs:  Clear HEART:  IRegular rate rhythm, no murmurs, no rubs, no clicks Abd:  Flat, positive bowel sounds, no organomegally, no rebound, no guarding Ext:  2 plus pulses, no edema, no cyanosis, no clubbing Skin:  No rashes no nodules Neuro:  CN II through XII intact, motor grossly intact  EKG Atrial fibrillation with a controlled ventricular response  Assess/Plan:

## 2010-08-26 ENCOUNTER — Encounter: Payer: Self-pay | Admitting: Internal Medicine

## 2010-10-28 ENCOUNTER — Encounter: Payer: Self-pay | Admitting: Internal Medicine

## 2010-12-30 ENCOUNTER — Encounter: Payer: Self-pay | Admitting: Obstetrics and Gynecology

## 2010-12-30 ENCOUNTER — Other Ambulatory Visit: Payer: Self-pay | Admitting: *Deleted

## 2010-12-30 DIAGNOSIS — N6489 Other specified disorders of breast: Secondary | ICD-10-CM

## 2010-12-31 ENCOUNTER — Other Ambulatory Visit: Payer: Self-pay | Admitting: Obstetrics and Gynecology

## 2010-12-31 DIAGNOSIS — N6489 Other specified disorders of breast: Secondary | ICD-10-CM

## 2011-03-31 ENCOUNTER — Other Ambulatory Visit: Payer: Self-pay | Admitting: Cardiology

## 2011-03-31 DIAGNOSIS — I714 Abdominal aortic aneurysm, without rupture: Secondary | ICD-10-CM

## 2011-04-01 ENCOUNTER — Encounter (INDEPENDENT_AMBULATORY_CARE_PROVIDER_SITE_OTHER): Payer: Medicare Other | Admitting: Cardiology

## 2011-04-01 DIAGNOSIS — I714 Abdominal aortic aneurysm, without rupture: Secondary | ICD-10-CM

## 2011-04-01 DIAGNOSIS — I700 Atherosclerosis of aorta: Secondary | ICD-10-CM

## 2011-06-08 ENCOUNTER — Encounter: Payer: Self-pay | Admitting: Internal Medicine

## 2011-07-21 ENCOUNTER — Other Ambulatory Visit: Payer: Self-pay | Admitting: *Deleted

## 2011-07-21 DIAGNOSIS — M8580 Other specified disorders of bone density and structure, unspecified site: Secondary | ICD-10-CM

## 2011-07-22 ENCOUNTER — Other Ambulatory Visit: Payer: Self-pay | Admitting: *Deleted

## 2011-07-22 ENCOUNTER — Encounter: Payer: Self-pay | Admitting: Gynecology

## 2011-07-22 DIAGNOSIS — M8580 Other specified disorders of bone density and structure, unspecified site: Secondary | ICD-10-CM | POA: Insufficient documentation

## 2011-07-22 DIAGNOSIS — N6489 Other specified disorders of breast: Secondary | ICD-10-CM

## 2011-08-03 ENCOUNTER — Encounter: Payer: Self-pay | Admitting: Obstetrics and Gynecology

## 2011-08-03 ENCOUNTER — Ambulatory Visit (INDEPENDENT_AMBULATORY_CARE_PROVIDER_SITE_OTHER): Payer: Medicare Other | Admitting: Obstetrics and Gynecology

## 2011-08-03 VITALS — BP 116/72 | Ht 64.0 in | Wt 176.0 lb

## 2011-08-03 DIAGNOSIS — N8111 Cystocele, midline: Secondary | ICD-10-CM

## 2011-08-03 DIAGNOSIS — R3915 Urgency of urination: Secondary | ICD-10-CM

## 2011-08-03 DIAGNOSIS — N950 Postmenopausal bleeding: Secondary | ICD-10-CM

## 2011-08-03 DIAGNOSIS — M899 Disorder of bone, unspecified: Secondary | ICD-10-CM

## 2011-08-03 DIAGNOSIS — M8580 Other specified disorders of bone density and structure, unspecified site: Secondary | ICD-10-CM

## 2011-08-03 DIAGNOSIS — IMO0002 Reserved for concepts with insufficient information to code with codable children: Secondary | ICD-10-CM

## 2011-08-03 DIAGNOSIS — E039 Hypothyroidism, unspecified: Secondary | ICD-10-CM | POA: Insufficient documentation

## 2011-08-03 NOTE — Patient Instructions (Signed)
Schedule pelvic ultrasound

## 2011-08-03 NOTE — Progress Notes (Signed)
The patient came to see me today for her annual GYN exam. She has had some other episodes of postmenopausal bleeding this year. They are just spotting. She remains on Coumadin for coronary artery disease with atrial fibrillation. She had episodes similar to this 3 years ago and had a normal ultrasound. She is due for a mammogram and a bone density tomorrow. She is osteopenia without an elevated FRAX risk. She takes calcium and vitamin D and has not had a fracture. She does have a cystocele and does have some urinary urgency without incontinence. She will occasionally have nocturia x1. She is having no dysuria or hematuria. She is having no pelvic pain either related to the bleeding or not related. She is not aware of vaginal dryness. She has infrequent intercourse without problems. 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  ROS: 12 system review done. Pertinent positives above. Other positives include hypertension, migraine headache, aortic aneurysm,GERD, Bell's palsy,hyperlipidemia, and hypothyroidism.  Physical examination: Caryn Bee present. HEENT within normal limits. Neck: Thyroid not large. No masses. Supraclavicular nodes: not enlarged. Breasts: Examined in both sitting and lying  position. No skin changes and no masses. Abdomen: Soft no guarding rebound or masses or hernia. Pelvic: External: Within normal limits. BUS: Within normal limits. Vaginal:within normal limits. Good estrogen effect. First-degree cystocele. Cervix: clean. Uterus: Normal size and shape. Adnexa: No masses. Rectovaginal exam: Confirmatory and negative. Extremities: Within normal limits.  Assessment: #1. Postmenopausal bleeding #2. Small cystocele #3. Urinary urgency #4. Osteopenia #5. CIN  Plan: Mammogram, bone density, pelvic ultrasound. Call when necessary for medication for detrussor instability.

## 2011-08-04 ENCOUNTER — Other Ambulatory Visit: Payer: Self-pay | Admitting: *Deleted

## 2011-08-04 ENCOUNTER — Telehealth: Payer: Self-pay | Admitting: Internal Medicine

## 2011-08-04 DIAGNOSIS — N63 Unspecified lump in unspecified breast: Secondary | ICD-10-CM

## 2011-08-04 LAB — HM DEXA SCAN

## 2011-08-04 NOTE — Telephone Encounter (Signed)
Spoke with Katie [name].  She is due in July and they will get a call for f/u [facility] in June  Pt aware

## 2011-08-04 NOTE — Telephone Encounter (Signed)
New msg Pt wants to know when she is to have another vascular test. Please call

## 2011-08-05 ENCOUNTER — Encounter: Payer: Self-pay | Admitting: Internal Medicine

## 2011-08-05 ENCOUNTER — Other Ambulatory Visit: Payer: Self-pay | Admitting: Obstetrics and Gynecology

## 2011-08-05 DIAGNOSIS — N6489 Other specified disorders of breast: Secondary | ICD-10-CM

## 2011-08-05 DIAGNOSIS — N63 Unspecified lump in unspecified breast: Secondary | ICD-10-CM

## 2011-08-19 ENCOUNTER — Encounter: Payer: Self-pay | Admitting: Obstetrics and Gynecology

## 2011-08-26 ENCOUNTER — Ambulatory Visit (INDEPENDENT_AMBULATORY_CARE_PROVIDER_SITE_OTHER): Payer: Medicare Other

## 2011-08-26 ENCOUNTER — Ambulatory Visit (INDEPENDENT_AMBULATORY_CARE_PROVIDER_SITE_OTHER): Payer: Medicare Other | Admitting: Obstetrics and Gynecology

## 2011-08-26 DIAGNOSIS — N950 Postmenopausal bleeding: Secondary | ICD-10-CM

## 2011-08-26 DIAGNOSIS — N83339 Acquired atrophy of ovary and fallopian tube, unspecified side: Secondary | ICD-10-CM

## 2011-08-26 NOTE — Progress Notes (Signed)
Patient came back today for a pelvic ultrasound because of intermittent postmenopausal spotting on Coumadin. Patient does not take hormone replacement. On ultrasound today her uterus appears normal with a thin echogenic endometrial echo of 2.6 mm. Both ovaries are atrophic. Her cul-de-sac is free of fluid.  Assessment: Postmenopausal spotting  Plan: Patient reassured. I have asked her to keep a diary of the spotting. She will call is an 8-10 weeks and let us know. She will sometimes go months without spotting. She will also discuss this with Dr. Melford Aase who treats her with the Coumadin for atrial fibrillation. She also complains today of a slight vaginal odor and we started her on oral rephresh 3 times a week.

## 2011-10-25 ENCOUNTER — Telehealth: Payer: Self-pay | Admitting: Internal Medicine

## 2011-10-25 NOTE — Telephone Encounter (Signed)
New problem:  Patient is requesting an Abd  ultrasound during her appt on 8/14.

## 2011-10-25 NOTE — Telephone Encounter (Signed)
Ultrasound scheduled patient aware

## 2011-10-26 ENCOUNTER — Other Ambulatory Visit: Payer: Self-pay | Admitting: *Deleted

## 2011-10-26 DIAGNOSIS — I714 Abdominal aortic aneurysm, without rupture: Secondary | ICD-10-CM

## 2011-10-27 ENCOUNTER — Encounter (INDEPENDENT_AMBULATORY_CARE_PROVIDER_SITE_OTHER): Payer: Medicare Other

## 2011-10-27 ENCOUNTER — Encounter: Payer: Self-pay | Admitting: Internal Medicine

## 2011-10-27 ENCOUNTER — Ambulatory Visit (INDEPENDENT_AMBULATORY_CARE_PROVIDER_SITE_OTHER): Payer: Medicare Other | Admitting: Internal Medicine

## 2011-10-27 VITALS — BP 116/64 | HR 67 | Resp 18 | Ht 64.0 in | Wt 167.8 lb

## 2011-10-27 DIAGNOSIS — I714 Abdominal aortic aneurysm, without rupture: Secondary | ICD-10-CM

## 2011-10-27 DIAGNOSIS — I10 Essential (primary) hypertension: Secondary | ICD-10-CM

## 2011-10-27 DIAGNOSIS — I4891 Unspecified atrial fibrillation: Secondary | ICD-10-CM

## 2011-10-27 MED ORDER — FUROSEMIDE 80 MG PO TABS: ORAL_TABLET | ORAL | Status: DC

## 2011-10-27 MED ORDER — OLMESARTAN MEDOXOMIL 20 MG PO TABS: ORAL_TABLET | ORAL | Status: DC

## 2011-10-27 NOTE — Patient Instructions (Addendum)
Your physician wants you to follow-up in: 1 year with Dr Knox Saliva will receive a reminder letter in the mail two months in advance. If you don't receive a letter, please call our office to schedule the follow-up appointment.  Your physician has recommended you make the following change in your medication: Take Furosemide 40 mg daily unless Blood Pressure is under 100.

## 2011-10-27 NOTE — Assessment & Plan Note (Signed)
She reports a 2 mm increase in the size of her aneurysm. Will recheck in several months. No abdominal pain.

## 2011-10-27 NOTE — Assessment & Plan Note (Signed)
Her ventricular rate is well controlled. She will continue on her digoxin and beta blocker and warfarin.

## 2011-10-27 NOTE — Progress Notes (Signed)
HPI [name] returns today for followup. She is pleasant 71 yo woman with chronic atrial fib, a mixed cardiomyopathy, systolic and diastolic heart failure, and copd. She notes some symptoms of dizziness. No syncope. She is able to walk a mile slowly on flat ground. No/minimal peripheral edema. Allergies  Allergen Reactions  . Codeine      Current Outpatient Prescriptions  Medication Sig Dispense Refill  . allopurinol (ZYLOPRIM) 300 MG tablet Take 150 mg by mouth daily.        Marland Kitchen aspirin 81 MG tablet Take 81 mg by mouth daily.        . benazepril (LOTENSIN) 20 MG tablet Take 20 mg by mouth daily.      . bisoprolol-hydrochlorothiazide (ZIAC) 5-6.25 MG per tablet Take 1 tablet by mouth daily. Take 1/2 pill      . Calcium Carbonate POWD Take 1 tablet by mouth daily.        . Cholecalciferol 5000 UNITS capsule Take 5,000 Units by mouth daily.        . digoxin (LANOXIN) 0.25 MG tablet Take 250 mcg by mouth daily.        . furosemide (LASIX) 80 MG tablet Take 80 mg by mouth. 1/2 po daily      . gabapentin (NEURONTIN) 300 MG capsule Take 300 mg by mouth 3 (three) times daily.      Marland Kitchen levothyroxine (SYNTHROID, LEVOTHROID) 50 MCG tablet Take 50 mcg by mouth daily.        Marland Kitchen olmesartan (BENICAR) 20 MG tablet ON HOLD -- only take if Blood Pressure is over 140      . pravastatin (PRAVACHOL) 40 MG tablet Take 40 mg by mouth at bedtime.        . roflumilast (DALIRESP) 500 MCG TABS tablet Take 500 mcg by mouth daily.      Marland Kitchen warfarin (COUMADIN) 2 MG tablet Take 2 mg by mouth daily.        Marland Kitchen DISCONTD: olmesartan (BENICAR) 20 MG tablet Take 20 mg by mouth daily.           Past Medical History  Diagnosis Date  . Atrial fibrillation   . COPD (chronic obstructive pulmonary disease)   . Bronchitis, chronic   . History of Bell's palsy   . Hypertension   . Coronary artery disease   . Migraine headache   . Atrial flutter   . Tobacco abuse   . Osteopenia   . Cervical dysplasia   . Thyroid disease    Hypo  . Elevated cholesterol     ROS:   All systems reviewed and negative except as noted in the HPI.   Past Surgical History  Procedure Date  . Cholecystectomy   . Broken leg   . Laser vein procedure   . Refractive surgery   . Colposcopy      Family History  Problem Relation Age of Onset  . Cirrhosis Mother   . Heart defect Sister   . Breast cancer Sister     age 56     History   Social History  . Marital Status: Married    Spouse Name: N/A    Number of Children: N/A  . Years of Education: N/A   Occupational History  . Not on file.   Social History Main Topics  . Smoking status: Former Research scientist (life sciences)  . Smokeless tobacco: Not on file   Comment: History of tobacco abuse  . Alcohol Use: 0.0 oz/week     rare  .  Drug Use: No  . Sexually Active: Yes    Birth Control/ Protection: Post-menopausal   Other Topics Concern  . Not on file   Social History Narrative   Lives in [place] with husbandOne daughter     BP 116/64  Pulse 67  Resp 18  Ht 5\' 4"  (1.626 m)  Wt 167 lb 12.8 oz (76.114 kg)  BMI 28.80 kg/m2  Physical Exam:  Well appearing 71 yo woman, NAD HEENT: Unremarkable Neck:  No JVD, no thyromegally Lungs:  Clear with no wheezes, rales, or rhonchi. HEART:  Regular rate rhythm, no murmurs, no rubs, no clicks Abd:  soft, positive bowel sounds, no organomegally, no rebound, no guarding Ext:  2 plus pulses, no edema, no cyanosis, no clubbing Skin:  No rashes no nodules Neuro:  CN II through XII intact, motor grossly intact  EKG Atrial fib with lbbb  Assess/Plan:

## 2011-10-27 NOTE — Assessment & Plan Note (Signed)
Her blood pressure is actually on the low side. I have asked her to hold her lasix. Will follow. If she has more swelling, then she should restart her lasix and notify us of the change.

## 2012-06-27 ENCOUNTER — Encounter (INDEPENDENT_AMBULATORY_CARE_PROVIDER_SITE_OTHER): Payer: Medicare Other

## 2012-06-27 DIAGNOSIS — I714 Abdominal aortic aneurysm, without rupture: Secondary | ICD-10-CM

## 2012-07-05 ENCOUNTER — Encounter: Payer: Self-pay | Admitting: Internal Medicine

## 2012-08-08 LAB — HM MAMMOGRAPHY

## 2012-08-09 ENCOUNTER — Encounter: Payer: Self-pay | Admitting: Gynecology

## 2012-08-09 ENCOUNTER — Ambulatory Visit (INDEPENDENT_AMBULATORY_CARE_PROVIDER_SITE_OTHER): Payer: Medicare Other | Admitting: Gynecology

## 2012-08-09 VITALS — BP 126/72 | Ht 63.0 in | Wt 170.0 lb

## 2012-08-09 DIAGNOSIS — IMO0002 Reserved for concepts with insufficient information to code with codable children: Secondary | ICD-10-CM

## 2012-08-09 DIAGNOSIS — M899 Disorder of bone, unspecified: Secondary | ICD-10-CM

## 2012-08-09 DIAGNOSIS — Z780 Asymptomatic menopausal state: Secondary | ICD-10-CM

## 2012-08-09 DIAGNOSIS — N8111 Cystocele, midline: Secondary | ICD-10-CM

## 2012-08-09 DIAGNOSIS — M8580 Other specified disorders of bone density and structure, unspecified site: Secondary | ICD-10-CM

## 2012-08-09 DIAGNOSIS — N952 Postmenopausal atrophic vaginitis: Secondary | ICD-10-CM

## 2012-08-09 NOTE — Patient Instructions (Addendum)

## 2012-08-09 NOTE — Progress Notes (Signed)
Katie [name] Mar 03, 1941 [id number]   History:    72 y.o.  for annual gyn exam and is asymptomatic. She remains on Coumadin for coronary artery disease with atrial fibrillation.She is osteopenia without an elevated FRAX risk.patient. Lowest T. score was at the left femoral hip with a value of -1.0 on bone density study done May 2013 She takes calcium and vitamin D and has not had a fracture. She does have a cystocele and does have some urinary urgency without incontinence. She will occasionally have nocturia x1.She has infrequent intercourse without problems. 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 In June of 2013 she had an ultrasound because of some intermittent postmenopausal spotting while on Coumadin. She takes no hormone replacement therapy. That ultrasound demonstrated that her uterus appears normal with a thin echogenic endometrial echo of 2.6 mm. Both ovaries are atrophic. Her cul-de-sac is free of fluid.patient denies any further vaginal bleeding since then.the patient's last colonoscopy report to be normal in 2004 she will make contact with her gastroenterologist for a followup screening. Her primary physician gives with Hemoccult cards. Her last mammogram was done this week she reports was normal. Her shingles and TDAP vaccine are up-to-date.  Past medical history,surgical history, family history and social history were all reviewed and documented in the EPIC chart.  Gynecologic History No LMP recorded. Patient is postmenopausal. Contraception: post menopausal status Last Pap: 2012. Results were: normal Last mammogram: 2014. Results were: normal  Obstetric History OB History   Grav Para Term Preterm Abortions TAB SAB Ect Mult Living   4 3 3  1     3      # Outc Date GA Lbr Len/2nd Wgt Sex Del Anes PTL Lv   1 TRM            2 TRM            3 TRM            4 ABT                ROS: A ROS was performed and pertinent positives and negatives are included in  the history.  GENERAL: No fevers or chills. HEENT: No change in vision, no earache, sore throat or sinus congestion. NECK: No pain or stiffness. CARDIOVASCULAR: No chest pain or pressure. No palpitations. PULMONARY: No shortness of breath, cough or wheeze. GASTROINTESTINAL: No abdominal pain, nausea, vomiting or diarrhea, melena or bright red blood per rectum. GENITOURINARY: No urinary frequency, urgency, hesitancy or dysuria. MUSCULOSKELETAL: No joint or muscle pain, no back pain, no recent trauma. DERMATOLOGIC: No rash, no itching, no lesions. ENDOCRINE: No polyuria, polydipsia, no heat or cold intolerance. No recent change in weight. HEMATOLOGICAL: No anemia or easy bruising or bleeding. NEUROLOGIC: No headache, seizures, numbness, tingling or weakness. PSYCHIATRIC: No depression, no loss of interest in normal activity or change in sleep pattern.     Exam: chaperone present  BP 126/72  Ht 5\' 3"  (1.6 m)  Wt 170 lb (77.111 kg)  BMI 30.12 kg/m2  Body mass index is 30.12 kg/(m^2).  General appearance : Well developed well nourished female. No acute distress HEENT: Neck supple, trachea midline, no carotid bruits, no thyroidmegaly Lungs: Clear to auscultation, no rhonchi or wheezes, or rib retractions  Heart: Regular rate and rhythm, no murmurs or gallops Breast:Examined in sitting and supine position were symmetrical in appearance, no palpable masses or tenderness,  no skin retraction, no nipple inversion, no nipple discharge, no skin  discoloration, no axillary or supraclavicular lymphadenopathy Abdomen: no palpable masses or tenderness, no rebound or guarding Extremities: no edema or skin discoloration or tenderness  Pelvic:  Bartholin, Urethra, Skene Glands: Within normal limits             Vagina: No gross lesions or discharge, first degree cystocele, Vaginal atrophy  Cervix: No gross lesions or discharge  Uterus  axial, normal size, shape and consistency, non-tender and mobile  Adnexa   Without masses or tenderness  Anus and perineum  normal   Rectovaginal  normal sphincter tone without palpated masses or tenderness             Hemoccult PCP provide scars     Assessment/Plan:  72 y.o. female for annual exam and is doing well. No Pap smear done today. New screening guidelines discussed. The patient was encouraged to make appointment with gastroenterologist sinceit has been 10 years since her last colonoscopy. Her PCP has been doing her lab work. She has been feeling lightheaded recently and has not had her followup appointment with her cardiologist for which it was recommended. She was reminded to take her calcium with vitamin D along with regular exercise for osteoporosis prevention. She will need a bone density study next year. She was also reminded to do her monthly self breast exam.    Terrance Mass MD, 12:45 PM 08/09/2012

## 2012-09-23 ENCOUNTER — Observation Stay (HOSPITAL_COMMUNITY)
Admission: EM | Admit: 2012-09-23 | Discharge: 2012-09-25 | Disposition: A | Discharge status: Home / Self Care | Payer: Medicare Other | Attending: Internal Medicine | Admitting: Internal Medicine

## 2012-09-23 ENCOUNTER — Encounter (HOSPITAL_COMMUNITY): Payer: Self-pay | Admitting: *Deleted

## 2012-09-23 ENCOUNTER — Observation Stay (HOSPITAL_COMMUNITY): Payer: Medicare Other

## 2012-09-23 DIAGNOSIS — K219 Gastro-esophageal reflux disease without esophagitis: Secondary | ICD-10-CM | POA: Insufficient documentation

## 2012-09-23 DIAGNOSIS — R079 Chest pain, unspecified: Secondary | ICD-10-CM

## 2012-09-23 DIAGNOSIS — I4891 Unspecified atrial fibrillation: Secondary | ICD-10-CM | POA: Insufficient documentation

## 2012-09-23 DIAGNOSIS — J449 Chronic obstructive pulmonary disease, unspecified: Secondary | ICD-10-CM | POA: Diagnosis present

## 2012-09-23 DIAGNOSIS — E039 Hypothyroidism, unspecified: Secondary | ICD-10-CM | POA: Insufficient documentation

## 2012-09-23 DIAGNOSIS — I5042 Chronic combined systolic (congestive) and diastolic (congestive) heart failure: Secondary | ICD-10-CM | POA: Diagnosis present

## 2012-09-23 DIAGNOSIS — I509 Heart failure, unspecified: Secondary | ICD-10-CM | POA: Insufficient documentation

## 2012-09-23 DIAGNOSIS — I714 Abdominal aortic aneurysm, without rupture, unspecified: Secondary | ICD-10-CM | POA: Insufficient documentation

## 2012-09-23 DIAGNOSIS — I10 Essential (primary) hypertension: Secondary | ICD-10-CM | POA: Insufficient documentation

## 2012-09-23 DIAGNOSIS — I2510 Atherosclerotic heart disease of native coronary artery without angina pectoris: Secondary | ICD-10-CM | POA: Insufficient documentation

## 2012-09-23 DIAGNOSIS — I447 Left bundle-branch block, unspecified: Secondary | ICD-10-CM | POA: Insufficient documentation

## 2012-09-23 DIAGNOSIS — Z79899 Other long term (current) drug therapy: Secondary | ICD-10-CM | POA: Insufficient documentation

## 2012-09-23 DIAGNOSIS — I428 Other cardiomyopathies: Secondary | ICD-10-CM | POA: Insufficient documentation

## 2012-09-23 DIAGNOSIS — J4489 Other specified chronic obstructive pulmonary disease: Secondary | ICD-10-CM | POA: Insufficient documentation

## 2012-09-23 DIAGNOSIS — E871 Hypo-osmolality and hyponatremia: Secondary | ICD-10-CM | POA: Insufficient documentation

## 2012-09-23 DIAGNOSIS — R072 Precordial pain: Principal | ICD-10-CM | POA: Insufficient documentation

## 2012-09-23 DIAGNOSIS — E785 Hyperlipidemia, unspecified: Secondary | ICD-10-CM | POA: Insufficient documentation

## 2012-09-23 DIAGNOSIS — Z7901 Long term (current) use of anticoagulants: Secondary | ICD-10-CM | POA: Insufficient documentation

## 2012-09-23 DIAGNOSIS — I5022 Chronic systolic (congestive) heart failure: Secondary | ICD-10-CM | POA: Insufficient documentation

## 2012-09-23 DIAGNOSIS — R42 Dizziness and giddiness: Secondary | ICD-10-CM | POA: Insufficient documentation

## 2012-09-23 LAB — BASIC METABOLIC PANEL
CO2: 28 mEq/L (ref 19–32)
Glucose, Bld: 105 mg/dL — ABNORMAL HIGH (ref 70–99)
Potassium: 4.5 mEq/L (ref 3.5–5.1)
Sodium: 131 mEq/L — ABNORMAL LOW (ref 135–145)

## 2012-09-23 LAB — CBC
HCT: 41.6 % (ref 36.0–46.0)
MCV: 89.5 fL (ref 78.0–100.0)
RBC: 4.65 MIL/uL (ref 3.87–5.11)
WBC: 8.5 10*3/uL (ref 4.0–10.5)

## 2012-09-23 LAB — POCT I-STAT TROPONIN I

## 2012-09-23 LAB — DIGOXIN LEVEL: Digoxin Level: 1.3 ng/mL (ref 0.8–2.0)

## 2012-09-23 MED ORDER — NITROGLYCERIN 0.4 MG SL SUBL
0.4000 mg | SUBLINGUAL_TABLET | SUBLINGUAL | Status: DC | PRN
  Administered 2012-09-23: 0.4 mg via SUBLINGUAL

## 2012-09-23 MED ORDER — ASPIRIN 325 MG PO TABS
325.0000 mg | ORAL_TABLET | Freq: Once | ORAL | Status: AC
  Administered 2012-09-23: 325 mg via ORAL
  Filled 2012-09-23: qty 1

## 2012-09-23 MED ORDER — ACETAMINOPHEN 325 MG PO TABS
650.0000 mg | ORAL_TABLET | Freq: Once | ORAL | Status: AC
  Administered 2012-09-23: 650 mg via ORAL
  Filled 2012-09-23: qty 2

## 2012-09-23 MED ORDER — ONDANSETRON HCL 4 MG/2ML IJ SOLN: 4.0000 mg | Freq: Four times a day (QID) | INTRAMUSCULAR | Status: DC | PRN

## 2012-09-23 NOTE — ED Provider Notes (Signed)
I evaluated the patient in conjunction with the resident physician.  I saw the relevant studies, including the ecg (as needed) and agree with the interpretation. The documentation is accurate with the following additional / clarifications:  Carmin Muskrat, MD 09/23/12 [id number]

## 2012-09-23 NOTE — ED Provider Notes (Signed)
History    CSN: [id number] Arrival date & time 09/23/12  1940  First MD Initiated Contact with Patient 09/23/12 2008     Chief Complaint  Patient presents with  . Chest Pain   (Consider location/radiation/quality/duration/timing/severity/associated sxs/prior Treatment) Patient is a 72 y.o. female presenting with chest pain. The history is provided by the patient and a relative.  Chest Pain Associated symptoms comment:  Katie [name] is a 72 y.o. female h/o afib (on coumadin) here with substernal chest pressure earlier this afternoon, persisting until arrival to the ED.  It did not radiate, was associated with SOB.  She denies emesis or diaphoresis.  Patient frequently has this same chest pain, but today it was more severe and debilitating.  She states the pain is still on going now, but more mild.  She denies h/o ACS.  Past Medical History  Diagnosis Date  . Atrial fibrillation   . COPD (chronic obstructive pulmonary disease)   . Bronchitis, chronic   . History of Bell's palsy   . Hypertension   . Coronary artery disease   . Migraine headache   . Atrial flutter   . Tobacco abuse   . Osteopenia   . Cervical dysplasia   . Thyroid disease     Hypo  . Elevated cholesterol    Past Surgical History  Procedure Laterality Date  . Cholecystectomy    . Broken leg    . Laser vein procedure    . Refractive surgery    . Colposcopy     Family History  Problem Relation Age of Onset  . Cirrhosis Mother   . Heart defect Sister   . Breast cancer Sister     age 79   History  Substance Use Topics  . Smoking status: Former Smoker    Quit date: 08/10/2002  . Smokeless tobacco: Never Used     Comment: History of tobacco abuse  . Alcohol Use: 0.0 oz/week     Comment: rare   OB History   Grav Para Term Preterm Abortions TAB SAB Ect Mult Living   4 3 3  1     3      Review of Systems  Cardiovascular: Positive for chest pain.  10 Systems reviewed and are negative for acute  change except as noted in the HPI.   Allergies  Codeine  Home Medications   Current Outpatient Rx  Name  Route  Sig  Dispense  Refill  . allopurinol (ZYLOPRIM) 300 MG tablet   Oral   Take 150 mg by mouth daily.           Marland Kitchen aspirin 81 MG tablet   Oral   Take 81 mg by mouth daily.           Marland Kitchen azithromycin (ZITHROMAX) 250 MG tablet               . benazepril (LOTENSIN) 20 MG tablet   Oral   Take 10 mg by mouth daily.          . bisoprolol-hydrochlorothiazide (ZIAC) 5-6.25 MG per tablet   Oral   Take 0.5 tablets by mouth daily.          . Calcium Carbonate POWD   Oral   Take 1 tablet by mouth daily.           . Cholecalciferol 5000 UNITS capsule   Oral   Take 5,000 Units by mouth daily.           Marland Kitchen  ciprofloxacin (CIPRO) 500 MG tablet               . digoxin (LANOXIN) 0.25 MG tablet   Oral   Take 250 mcg by mouth daily.          . furosemide (LASIX) 80 MG tablet   Oral   Take 40 mg by mouth daily. daily unless Blood Pressure is under 100         . gabapentin (NEURONTIN) 300 MG capsule   Oral   Take 300 mg by mouth 3 (three) times daily.         Marland Kitchen gentamicin (GARAMYCIN) 0.3 % ophthalmic solution               . levothyroxine (SYNTHROID, LEVOTHROID) 50 MCG tablet   Oral   Take 50 mcg by mouth daily.           . montelukast (SINGULAIR) 10 MG tablet               . pravastatin (PRAVACHOL) 40 MG tablet   Oral   Take 40 mg by mouth at bedtime.           . roflumilast (DALIRESP) 500 MCG TABS tablet   Oral   Take 500 mcg by mouth daily.         Marland Kitchen warfarin (COUMADIN) 2 MG tablet   Oral   Take 2 mg by mouth daily.            BP 165/80  Pulse 86  Temp(Src) 98 F (36.7 C) (Oral)  Resp 18  SpO2 96% Physical Exam  Nursing note and vitals reviewed. Constitutional: She is oriented to person, place, and time. She appears well-developed and well-nourished. No distress.  HENT:  Head: Normocephalic and atraumatic.   Eyes: Conjunctivae and EOM are normal. Pupils are equal, round, and reactive to light. No scleral icterus.  Neck: Normal range of motion. Neck supple. No JVD present. No tracheal deviation present. No thyromegaly present.  Cardiovascular: Normal rate and normal heart sounds.  Exam reveals no gallop and no friction rub.   No murmur heard. irreg irreg rhythm  Pulmonary/Chest: Effort normal and breath sounds normal. No respiratory distress. She has no wheezes. She exhibits no tenderness.  Abdominal: Soft. Bowel sounds are normal. She exhibits no distension and no mass. There is no tenderness. There is no rebound and no guarding.  Musculoskeletal: Normal range of motion. She exhibits no edema and no tenderness.  Lymphadenopathy:    She has no cervical adenopathy.  Neurological: She is alert and oriented to person, place, and time.  Skin: Skin is warm and dry. No rash noted. No erythema. No pallor.    ED Course  Procedures (including critical care time) Labs Reviewed  PROTIME-INR - Abnormal; Notable for the following:    Prothrombin Time 18.7 (*)    INR 1.61 (*)    All other components within normal limits  BASIC METABOLIC PANEL - Abnormal; Notable for the following:    Sodium 131 (*)    Chloride 95 (*)    Glucose, Bld 105 (*)    GFR calc non Af Amer 51 (*)    GFR calc Af Amer 59 (*)    All other components within normal limits  CBC  POCT I-STAT TROPONIN I   No results found. No diagnosis found.   Date: 09/23/2012  Rate: 58  Rhythm: atrial fibrillation  QRS Axis: right  Intervals: normal  ST/T Wave abnormalities: nonspecific ST changes  Conduction Disutrbances:nonspecific intraventricular conduction delay  Narrative Interpretation:   Old EKG Reviewed: none available and unchanged    MDM  Patient presents with a concerning story for ACS. Her chest pain resolved with nitro tab.  She has agreed to be admitted for further work up.  Cardiology is on board for consultation as  well and rec for the patient to be admitted to the medicine team.  Everlene Balls, MD 09/23/12 [phone]

## 2012-09-23 NOTE — ED Notes (Signed)
The pt is c/o indigestion since 1600 then after drinking coke she began to experience chest tightness.  Hx of af

## 2012-09-23 NOTE — H&P (Signed)
PCP:   [name], MD   Chief Complaint:  cp  HPI: 72 yo female with h/o copd, htn, no prev cad, afib comes in with [facility] with no radiation that started earlier this afternoon.  Feels more like pressure sensation, not sharp.  No abd pain.  No sob.  Some nausea no vomiting.  Has h/o hiatal hernia, says it does not feel like her normal gerd.  No cough.  No fevers.  Pt still having the pressure in the ED.  No fevers.  No le edema or swelling.  Pt has ntg at home but did not take it.  Has not gotten any morphine or ntg in ED yet.  Review of Systems:  Positive and negative as per HPI otherwise all other systems are negative  Past Medical History: Past Medical History  Diagnosis Date  . Atrial fibrillation   . COPD (chronic obstructive pulmonary disease)   . Bronchitis, chronic   . History of Bell's palsy   . Hypertension   . Coronary artery disease   . Migraine headache   . Atrial flutter   . Tobacco abuse   . Osteopenia   . Cervical dysplasia   . Thyroid disease     Hypo  . Elevated cholesterol    Past Surgical History  Procedure Laterality Date  . Cholecystectomy    . Broken leg    . Laser vein procedure    . Refractive surgery    . Colposcopy      Medications: Prior to Admission medications   Medication Sig Start Date End Date Taking? Authorizing Provider  allopurinol (ZYLOPRIM) 300 MG tablet Take 300 mg by mouth every other day.    Yes Historical Provider, MD  aspirin 81 MG tablet Take 81 mg by mouth daily.     Yes Historical Provider, MD  benazepril (LOTENSIN) 20 MG tablet Take 10 mg by mouth daily.    Yes Historical Provider, MD  digoxin (LANOXIN) 0.25 MG tablet Take 250 mcg by mouth daily.    Yes Historical Provider, MD  furosemide (LASIX) 80 MG tablet Take 40 mg by mouth daily. daily unless Blood Pressure is under 100 10/27/11  Yes Evans Lance, MD  gabapentin (NEURONTIN) 300 MG capsule Take 300 mg by mouth 3 (three) times daily.   Yes Historical  Provider, MD  gentamicin (GARAMYCIN) 0.3 % ophthalmic solution Place 1 drop into both eyes daily as needed. For red eye 07/16/12  Yes Historical Provider, MD  levothyroxine (SYNTHROID, LEVOTHROID) 50 MCG tablet Take 50 mcg by mouth at bedtime.    Yes Historical Provider, MD  metroNIDAZOLE (FLAGYL) 250 MG tablet Take 250 mg by mouth 3 (three) times daily. Started September 19, 2012 take for 7 days   Yes Historical Provider, MD  montelukast (SINGULAIR) 10 MG tablet Take 10 mg by mouth daily as needed. For allergy 07/13/12  Yes Historical Provider, MD  pravastatin (PRAVACHOL) 40 MG tablet Take 40 mg by mouth at bedtime.     Yes Historical Provider, MD  prochlorperazine (COMPAZINE) 5 MG tablet Take 5 mg by mouth every 6 (six) hours as needed. Dizzy   Yes Historical Provider, MD  roflumilast (DALIRESP) 500 MCG TABS tablet Take 500 mcg by mouth daily.   Yes Historical Provider, MD  warfarin (COUMADIN) 2 MG tablet Take 2 mg by mouth daily.     Yes Historical Provider, MD    Allergies:   Allergies  Allergen Reactions  . Codeine     unknown  Social History:  reports that she quit smoking about 10 years ago. She has never used smokeless tobacco. She reports that  drinks alcohol. She reports that she does not use illicit drugs.  Family History: Family History  Problem Relation Age of Onset  . Cirrhosis Mother   . Heart defect Sister   . Breast cancer Sister     age 26    Physical Exam: Filed Vitals:   09/23/12 2030 09/23/12 2045 09/23/12 2100 09/23/12 2115  BP: 156/97 150/87 166/71 160/62  Pulse: 74 76 76 60  Temp:      TempSrc:      Resp: 17 19 19 18   SpO2: 100% 100% 98% 100%   General appearance: alert, cooperative and no distress Head: Normocephalic, without obvious abnormality, atraumatic Eyes: negative Nose: Nares normal. Septum midline. Mucosa normal. No drainage or sinus tenderness. Neck: no JVD and supple, symmetrical, trachea midline Lungs: clear to auscultation  bilaterally Heart: regular rate and rhythm, S1, S2 normal, no murmur, click, rub or gallop Abdomen: soft, non-tender; bowel sounds normal; no masses,  no organomegaly Extremities: extremities normal, atraumatic, no cyanosis or edema Pulses: 2+ and symmetric Skin: Skin color, texture, turgor normal. No rashes or lesions Neurologic: Grossly normal    Labs on Admission:   Recent Labs  09/23/12 1949  NA 131*  K 4.5  CL 95*  CO2 28  GLUCOSE 105*  BUN 14  CREATININE 1.08  CALCIUM 9.8    Recent Labs  09/23/12 1949  WBC 8.5  HGB 14.7  HCT 41.6  MCV 89.5  PLT 258    Radiological Exams on Admission: Dg Chest 2 View  09/23/2012   *RADIOLOGY REPORT*  Clinical Data: Chest pain today.  History of hypertension.  Spine history of smoking.  CHEST - 2 VIEW  Comparison: 10/24/2009  Findings: Cardiac enlargement with normal pulmonary vascularity. Hyperinflation suggesting emphysema.  Chronic bronchitic changes. No focal consolidation or airspace disease.  No blunting of costophrenic angles.  No pneumothorax.  Calcified and tortuous aorta.  Calcified granulomas in the right lung.  Degenerative changes in the spine.  No significant change since previous study.  IMPRESSION: Emphysematous and chronic bronchitic changes.  Cardiac enlargement. No evidence of active disease.   Original Report Authenticated By: Lucienne Capers, M.D.    Assessment/Plan 72 yo female with atypical cp with multiple risk factors relieved with ntg  Principal Problem:   Chest pain Active Problems:   HYPERTENSION   CAD   Atrial fibrillation   CONGESTIVE HEART FAILURE UNSPECIFIED   ABDOMINAL AORTIC ANEURYSM   COPD  Pt received her first dose on ntg sl while my interview, within 5 minutes had complete resolution of pressure sensation.  Now has headache from ntg.  Will place on tele floor.  [facility] has been called for consultation also, will cover for acs with lovenox.  Romi.  ekg afib c svr rate 58 no acute  changes.  Further w/u per cardiology.  Had cath about 5 years ago requiring no intervention per pt report.  Full code.  obs.  Omah Dewalt A 09/23/2012, 10:50 PM

## 2012-09-24 ENCOUNTER — Encounter (HOSPITAL_COMMUNITY): Payer: Self-pay | Admitting: *Deleted

## 2012-09-24 DIAGNOSIS — R079 Chest pain, unspecified: Secondary | ICD-10-CM

## 2012-09-24 DIAGNOSIS — I4891 Unspecified atrial fibrillation: Secondary | ICD-10-CM

## 2012-09-24 DIAGNOSIS — I509 Heart failure, unspecified: Secondary | ICD-10-CM

## 2012-09-24 LAB — CBC
MCH: 30.9 pg (ref 26.0–34.0)
MCHC: 34.8 g/dL (ref 30.0–36.0)
MCV: 88.8 fL (ref 78.0–100.0)
Platelets: 240 10*3/uL (ref 150–400)
RDW: 13.5 % (ref 11.5–15.5)
WBC: 8.8 10*3/uL (ref 4.0–10.5)

## 2012-09-24 LAB — BASIC METABOLIC PANEL
BUN: 14 mg/dL (ref 6–23)
CO2: 29 mEq/L (ref 19–32)
Calcium: 9.5 mg/dL (ref 8.4–10.5)
Creatinine, Ser: 1.17 mg/dL — ABNORMAL HIGH (ref 0.50–1.10)

## 2012-09-24 LAB — PROTIME-INR: INR: 1.7 — ABNORMAL HIGH (ref 0.00–1.49)

## 2012-09-24 LAB — LIPID PANEL
Cholesterol: 140 mg/dL (ref 0–200)
HDL: 32 mg/dL — ABNORMAL LOW (ref >39)
LDL Cholesterol: 85 mg/dL (ref 0–99)
Total CHOL/HDL Ratio: 4.4 RATIO
Triglycerides: 116 mg/dL (ref <150)
VLDL: 23 mg/dL (ref 0–40)

## 2012-09-24 LAB — TROPONIN I: Troponin I: <0.3 ng/mL (ref <0.30)

## 2012-09-24 LAB — HEMOGLOBIN A1C: Hgb A1c MFr Bld: 5.4 % (ref <5.7)

## 2012-09-24 MED ORDER — REGADENOSON 0.4 MG/5ML IV SOLN
0.4000 mg | Freq: Once | INTRAVENOUS | Status: AC
  Administered 2012-09-25: 0.4 mg via INTRAVENOUS
  Filled 2012-09-24: qty 5

## 2012-09-24 MED ORDER — DIGOXIN 250 MCG PO TABS
250.0000 ug | ORAL_TABLET | Freq: Every day | ORAL | Status: DC
  Administered 2012-09-24 – 2012-09-25 (×2): 250 ug via ORAL
  Filled 2012-09-24 (×2): qty 1

## 2012-09-24 MED ORDER — NAPHAZOLINE HCL 0.1 % OP SOLN
2.0000 [drp] | Freq: Four times a day (QID) | OPHTHALMIC | Status: DC | PRN
  Filled 2012-09-24: qty 15

## 2012-09-24 MED ORDER — WARFARIN SODIUM 2 MG PO TABS
2.0000 mg | ORAL_TABLET | Freq: Every day | ORAL | Status: DC
  Administered 2012-09-24 – 2012-09-25 (×2): 2 mg via ORAL
  Filled 2012-09-24 (×2): qty 1

## 2012-09-24 MED ORDER — FUROSEMIDE 40 MG PO TABS
40.0000 mg | ORAL_TABLET | Freq: Every day | ORAL | Status: DC
  Administered 2012-09-24 – 2012-09-25 (×2): 40 mg via ORAL
  Filled 2012-09-24 (×2): qty 1

## 2012-09-24 MED ORDER — ASPIRIN 300 MG RE SUPP: 300.0000 mg | RECTAL | Status: DC

## 2012-09-24 MED ORDER — GABAPENTIN 300 MG PO CAPS
300.0000 mg | ORAL_CAPSULE | Freq: Three times a day (TID) | ORAL | Status: DC
  Administered 2012-09-24 – 2012-09-25 (×6): 300 mg via ORAL
  Filled 2012-09-24 (×7): qty 1

## 2012-09-24 MED ORDER — ASPIRIN EC 81 MG PO TBEC
81.0000 mg | DELAYED_RELEASE_TABLET | Freq: Every day | ORAL | Status: DC
  Administered 2012-09-24 – 2012-09-25 (×2): 81 mg via ORAL
  Filled 2012-09-24 (×2): qty 1

## 2012-09-24 MED ORDER — LEVOTHYROXINE SODIUM 50 MCG PO TABS
50.0000 ug | ORAL_TABLET | Freq: Every day | ORAL | Status: DC
  Administered 2012-09-24 (×2): 50 ug via ORAL
  Filled 2012-09-24 (×3): qty 1

## 2012-09-24 MED ORDER — METOPROLOL TARTRATE 12.5 MG HALF TABLET
12.5000 mg | ORAL_TABLET | Freq: Two times a day (BID) | ORAL | Status: DC
  Filled 2012-09-24 (×5): qty 1

## 2012-09-24 MED ORDER — NAPHAZOLINE-GLYCERIN 0.012-0.2 % OP SOLN
1.0000 [drp] | Freq: Four times a day (QID) | OPHTHALMIC | Status: DC | PRN
  Administered 2012-09-24: 2 [drp] via OPHTHALMIC
  Filled 2012-09-24: qty 15

## 2012-09-24 MED ORDER — NITROGLYCERIN 0.4 MG SL SUBL: 0.4000 mg | SUBLINGUAL_TABLET | SUBLINGUAL | Status: DC | PRN

## 2012-09-24 MED ORDER — SODIUM CHLORIDE 0.9 % IV SOLN: 250.0000 mL | INTRAVENOUS | Status: DC | PRN

## 2012-09-24 MED ORDER — ENOXAPARIN SODIUM 80 MG/0.8ML ~~LOC~~ SOLN
1.0000 mg/kg | Freq: Two times a day (BID) | SUBCUTANEOUS | Status: DC
  Administered 2012-09-24 – 2012-09-25 (×4): 80 mg via SUBCUTANEOUS
  Filled 2012-09-24 (×5): qty 0.8

## 2012-09-24 MED ORDER — PROCHLORPERAZINE MALEATE 5 MG PO TABS
5.0000 mg | ORAL_TABLET | Freq: Four times a day (QID) | ORAL | Status: DC | PRN
  Administered 2012-09-24: 5 mg via ORAL
  Filled 2012-09-24: qty 1

## 2012-09-24 MED ORDER — SODIUM CHLORIDE 0.9 % IJ SOLN: 3.0000 mL | INTRAMUSCULAR | Status: DC | PRN

## 2012-09-24 MED ORDER — METRONIDAZOLE 250 MG PO TABS
250.0000 mg | ORAL_TABLET | Freq: Three times a day (TID) | ORAL | Status: AC
  Administered 2012-09-24 (×3): 250 mg via ORAL
  Filled 2012-09-24 (×3): qty 1

## 2012-09-24 MED ORDER — ROFLUMILAST 500 MCG PO TABS
500.0000 ug | ORAL_TABLET | Freq: Every day | ORAL | Status: DC
  Administered 2012-09-24 – 2012-09-25 (×2): 500 ug via ORAL
  Filled 2012-09-24 (×2): qty 1

## 2012-09-24 MED ORDER — ASPIRIN 81 MG PO CHEW: 324.0000 mg | CHEWABLE_TABLET | ORAL | Status: DC

## 2012-09-24 MED ORDER — BENAZEPRIL HCL 10 MG PO TABS
10.0000 mg | ORAL_TABLET | Freq: Every day | ORAL | Status: DC
  Administered 2012-09-24 – 2012-09-25 (×2): 10 mg via ORAL
  Filled 2012-09-24 (×2): qty 1

## 2012-09-24 MED ORDER — ASPIRIN 81 MG PO TABS: 81.0000 mg | ORAL_TABLET | Freq: Every day | ORAL | Status: DC

## 2012-09-24 MED ORDER — ASPIRIN EC 81 MG PO TBEC
81.0000 mg | DELAYED_RELEASE_TABLET | Freq: Every day | ORAL | Status: DC
  Filled 2012-09-24: qty 1

## 2012-09-24 MED ORDER — SODIUM CHLORIDE 0.9 % IJ SOLN
3.0000 mL | Freq: Two times a day (BID) | INTRAMUSCULAR | Status: DC
  Administered 2012-09-24 – 2012-09-25 (×3): 3 mL via INTRAVENOUS

## 2012-09-24 MED ORDER — ACETAMINOPHEN 325 MG PO TABS
650.0000 mg | ORAL_TABLET | ORAL | Status: DC | PRN
  Administered 2012-09-24 – 2012-09-25 (×2): 650 mg via ORAL
  Filled 2012-09-24 (×3): qty 2

## 2012-09-24 MED ORDER — WARFARIN - PHYSICIAN DOSING INPATIENT: Freq: Every day | Status: DC

## 2012-09-24 MED ORDER — ALLOPURINOL 300 MG PO TABS
300.0000 mg | ORAL_TABLET | ORAL | Status: DC
  Administered 2012-09-24: 300 mg via ORAL
  Filled 2012-09-24: qty 1

## 2012-09-24 NOTE — Progress Notes (Signed)
[facility] PROGRESS NOTE  Katie [name] [id number] DOB: 14-Sep-1940 DOA: 09/23/2012 PCP: [name], MD  Assessment/Plan: 1. Chest pain: resolved with NTG. On telemonitor. myo view in am.   2. Atrial fibrillation : rate controlled on chronic anticoagulation. inr is subtherapeutic, hence on lovenox and coumadin.   3. Hypertension: better controlled.   4. Hyponatremia: not fluid overloaded. ? sligtly dehydrated. Continue to monitor.   5. AAA: stable on scan.   6. Chronic systolic heart failure; appears to be compensate.d   7. Vertigo: stable.   dvt prophylaxis.   Code Status: full code Family Communication: none at bedside Disposition Plan: pending. Possibly home if stress negative.    Consultants:  cardiology  Procedures:  Myoview in am.   HPI/Subjective: Chest pain free, dry and itching eyes, and slightly dizzy.   Objective: Filed Vitals:   09/24/12 0016 09/24/12 0342 09/24/12 1027 09/24/12 1400  BP: 143/70 144/67 145/70 140/56  Pulse: 69 57 56 52  Temp: 97.6 F (36.4 C) 97.9 F (36.6 C)  97.6 F (36.4 C)  TempSrc: Oral Oral  Oral  Resp: 18 16  18   Height: 5\' 4"  (1.626 m)     Weight: 79.4 kg (175 lb 0.7 oz) 79.4 kg (175 lb 0.7 oz)    SpO2: 96% 95%  98%    Intake/Output Summary (Last 24 hours) at 09/24/12 1614 Last data filed at 09/24/12 1300  Gross per 24 hour  Intake    480 ml  Output      0 ml  Net    480 ml   Filed Weights   09/24/12 0016 09/24/12 0342  Weight: 79.4 kg (175 lb 0.7 oz) 79.4 kg (175 lb 0.7 oz)    Exam:   General:  Alert afebrile comfortable  Cardiovascular: s1s2  Respiratory: CTAB  Abdomen: soft NT ND BS+  Musculoskeletal:  No pedal edema.   Data Reviewed: Basic Metabolic Panel:  Recent Labs Lab 09/23/12 1949 09/24/12 0130  NA 131* 129*  K 4.5 3.8  CL 95* 93*  CO2 28 29  GLUCOSE 105* 122*  BUN 14 14  CREATININE 1.08 1.17*  CALCIUM 9.8 9.5   Liver Function Tests: No results found for  this basename: AST, ALT, ALKPHOS, BILITOT, PROT, ALBUMIN,  in the last 168 hours No results found for this basename: LIPASE, AMYLASE,  in the last 168 hours No results found for this basename: AMMONIA,  in the last 168 hours CBC:  Recent Labs Lab 09/23/12 1949 09/24/12 0130  WBC 8.5 8.8  HGB 14.7 13.8  HCT 41.6 39.6  MCV 89.5 88.8  PLT 258 240   Cardiac Enzymes:  Recent Labs Lab 09/24/12 0100  TROPONINI <0.30   BNP (last 3 results) No results found for this basename: PROBNP,  in the last 8760 hours CBG: No results found for this basename: GLUCAP,  in the last 168 hours  No results found for this or any previous visit (from the past 240 hour(s)).   Studies: Dg Chest 2 View  09/23/2012   *RADIOLOGY REPORT*  Clinical Data: Chest pain today.  History of hypertension.  Spine history of smoking.  CHEST - 2 VIEW  Comparison: 10/24/2009  Findings: Cardiac enlargement with normal pulmonary vascularity. Hyperinflation suggesting emphysema.  Chronic bronchitic changes. No focal consolidation or airspace disease.  No blunting of costophrenic angles.  No pneumothorax.  Calcified and tortuous aorta.  Calcified granulomas in the right lung.  Degenerative changes in the spine.  No significant change since  previous study.  IMPRESSION: Emphysematous and chronic bronchitic changes.  Cardiac enlargement. No evidence of active disease.   Original Report Authenticated By: Lucienne Capers, M.D.    Scheduled Meds: . allopurinol  300 mg Oral QODAY  . aspirin EC  81 mg Oral Daily  . benazepril  10 mg Oral Daily  . digoxin  250 mcg Oral Daily  . enoxaparin (LOVENOX) injection  1 mg/kg Subcutaneous Q12H  . furosemide  40 mg Oral Daily  . gabapentin  300 mg Oral TID  . levothyroxine  50 mcg Oral QHS  . metoprolol tartrate  12.5 mg Oral BID  . metroNIDAZOLE  250 mg Oral TID  . [START ON 09/25/2012] regadenoson  0.4 mg Intravenous Once  . roflumilast  500 mcg Oral Daily  . sodium chloride  3 mL  Intravenous Q12H  . warfarin  2 mg Oral q1800  . Warfarin - Physician Dosing Inpatient   Does not apply q1800   Continuous Infusions:   Principal Problem:   Chest pain Active Problems:   HYPERTENSION   CAD   Atrial fibrillation   CONGESTIVE HEART FAILURE UNSPECIFIED   ABDOMINAL AORTIC ANEURYSM   COPD    Time spent: 25 min    Jkwon Treptow  [facility] Pager [phone]. If 7PM-7AM, please contact night-coverage at [email or web address], password [email or web address] 09/24/2012, 4:14 PM  LOS: 1 day

## 2012-09-24 NOTE — Consult Note (Signed)
Reason for Consult: chest pain Referring Physician: Dr. Willodean Rosenthal Katie [name] is an 72 y.o. female.  HPI: 72 yo woman with PMH of atrial fibrillation, COPD, hypertension, LBBB on ECG and 9/10 echo with EF 40-45%, mild AI/mild MR, mild LVH followed by Dr. Lovena Le, on warfarin/metoprolol who had substernal chest pain with pressure sensation lasting 5 minutes. She had no associated SOB, no nausea/vomiting, no syncope. She spends her time caring for grandchildren and she could walk 2 miles a year ago but has not walked that far in some time although she feels she could walk a mile; however, her husband disputes this fact. She doesn't climb stairs frequently. No PND, No orthopnea. She is willing and motivated to walk on treadmill if stress testing warranted.    Past Medical History  Diagnosis Date  . Atrial fibrillation   . COPD (chronic obstructive pulmonary disease)   . Bronchitis, chronic   . History of Bell's palsy   . Hypertension   . Coronary artery disease   . Migraine headache   . Atrial flutter   . Tobacco abuse   . Osteopenia   . Cervical dysplasia   . Thyroid disease     Hypo  . Elevated cholesterol     Past Surgical History  Procedure Laterality Date  . Cholecystectomy    . Broken leg    . Laser vein procedure    . Refractive surgery    . Colposcopy      Family History  Problem Relation Age of Onset  . Cirrhosis Mother   . Heart defect Sister   . Breast cancer Sister     age 66    Social History:  reports that she quit smoking about 10 years ago. She has never used smokeless tobacco. She reports that  drinks alcohol. She reports that she does not use illicit drugs.  Allergies:  Allergies  Allergen Reactions  . Codeine     unknown    Medications:  I have reviewed the patient's current medications. Prior to Admission:  Prescriptions prior to admission  Medication Sig Dispense Refill  . allopurinol (ZYLOPRIM) 300 MG tablet Take 300 mg by mouth every other  day.       Marland Kitchen aspirin 81 MG tablet Take 81 mg by mouth daily.        . benazepril (LOTENSIN) 20 MG tablet Take 10 mg by mouth daily.       . digoxin (LANOXIN) 0.25 MG tablet Take 250 mcg by mouth daily.       . furosemide (LASIX) 80 MG tablet Take 40 mg by mouth daily. daily unless Blood Pressure is under 100      . gabapentin (NEURONTIN) 300 MG capsule Take 300 mg by mouth 3 (three) times daily.      Marland Kitchen gentamicin (GARAMYCIN) 0.3 % ophthalmic solution Place 1 drop into both eyes daily as needed. For red eye      . levothyroxine (SYNTHROID, LEVOTHROID) 50 MCG tablet Take 50 mcg by mouth at bedtime.       . metroNIDAZOLE (FLAGYL) 250 MG tablet Take 250 mg by mouth 3 (three) times daily. Started September 19, 2012 take for 7 days      . montelukast (SINGULAIR) 10 MG tablet Take 10 mg by mouth daily as needed. For allergy      . pravastatin (PRAVACHOL) 40 MG tablet Take 40 mg by mouth at bedtime.        . prochlorperazine (COMPAZINE) 5 MG tablet  Take 5 mg by mouth every 6 (six) hours as needed. Dizzy      . roflumilast (DALIRESP) 500 MCG TABS tablet Take 500 mcg by mouth daily.      Marland Kitchen warfarin (COUMADIN) 2 MG tablet Take 2 mg by mouth daily.         Scheduled: . allopurinol  300 mg Oral QODAY  . aspirin EC  81 mg Oral Daily  . aspirin EC  81 mg Oral Daily  . benazepril  10 mg Oral Daily  . digoxin  250 mcg Oral Daily  . enoxaparin (LOVENOX) injection  1 mg/kg Subcutaneous Q12H  . furosemide  40 mg Oral Daily  . gabapentin  300 mg Oral TID  . levothyroxine  50 mcg Oral QHS  . metoprolol tartrate  12.5 mg Oral BID  . metroNIDAZOLE  250 mg Oral TID  . roflumilast  500 mcg Oral Daily  . sodium chloride  3 mL Intravenous Q12H  . warfarin  2 mg Oral q1800  . Warfarin - Physician Dosing Inpatient   Does not apply q1800    Results for orders placed during the hospital encounter of 09/23/12 (from the past 48 hour(s))  CBC     Status: None   Collection Time    09/23/12  7:49 PM      Result Value  Range   WBC 8.5  4.0 - 10.5 K/uL   RBC 4.65  3.87 - 5.11 MIL/uL   Hemoglobin 14.7  12.0 - 15.0 g/dL   HCT 41.6  36.0 - 46.0 %   MCV 89.5  78.0 - 100.0 fL   MCH 31.6  26.0 - 34.0 pg   MCHC 35.3  30.0 - 36.0 g/dL   RDW 13.5  11.5 - 15.5 %   Platelets 258  150 - 400 K/uL  PROTIME-INR     Status: Abnormal   Collection Time    09/23/12  7:49 PM      Result Value Range   Prothrombin Time 18.7 (*) 11.6 - 15.2 seconds   INR 1.61 (*) 0.00 - 99991111  BASIC METABOLIC PANEL     Status: Abnormal   Collection Time    09/23/12  7:49 PM      Result Value Range   Sodium 131 (*) 135 - 145 mEq/L   Potassium 4.5  3.5 - 5.1 mEq/L   Chloride 95 (*) 96 - 112 mEq/L   CO2 28  19 - 32 mEq/L   Glucose, Bld 105 (*) 70 - 99 mg/dL   BUN 14  6 - 23 mg/dL   Creatinine, Ser 1.08  0.50 - 1.10 mg/dL   Calcium 9.8  8.4 - 10.5 mg/dL   GFR calc non Af Amer 51 (*) >90 mL/min   GFR calc Af Amer 59 (*) >90 mL/min   Comment:            The eGFR has been calculated     using the CKD EPI equation.     This calculation has not been     validated in all clinical     situations.     eGFR's persistently     <90 mL/min signify     possible Chronic Kidney Disease.  POCT I-STAT TROPONIN I     Status: None   Collection Time    09/23/12  7:54 PM      Result Value Range   Troponin i, poc 0.02  0.00 - 0.08 ng/mL   Comment 3  Comment: Due to the release kinetics of cTnI,     a negative result within the first hours     of the onset of symptoms does not rule out     myocardial infarction with certainty.     If myocardial infarction is still suspected,     repeat the test at appropriate intervals.  DIGOXIN LEVEL     Status: None   Collection Time    09/23/12 10:56 PM      Result Value Range   Digoxin Level 1.3  0.8 - 2.0 ng/mL    Dg Chest 2 View  09/23/2012   *RADIOLOGY REPORT*  Clinical Data: Chest pain today.  History of hypertension.  Spine history of smoking.  CHEST - 2 VIEW  Comparison: 10/24/2009   Findings: Cardiac enlargement with normal pulmonary vascularity. Hyperinflation suggesting emphysema.  Chronic bronchitic changes. No focal consolidation or airspace disease.  No blunting of costophrenic angles.  No pneumothorax.  Calcified and tortuous aorta.  Calcified granulomas in the right lung.  Degenerative changes in the spine.  No significant change since previous study.  IMPRESSION: Emphysematous and chronic bronchitic changes.  Cardiac enlargement. No evidence of active disease.   Original Report Authenticated By: Lucienne Capers, M.D.    Review of Systems  Constitutional: Positive for malaise/fatigue. Negative for fever and chills.  HENT: Positive for hearing loss. Negative for ear pain and neck pain.   Eyes: Negative for blurred vision, double vision and photophobia.  Respiratory: Positive for shortness of breath. Negative for cough and hemoptysis.   Cardiovascular: Positive for chest pain and palpitations. Negative for orthopnea.  Gastrointestinal: Positive for heartburn. Negative for nausea, vomiting and abdominal pain.  Genitourinary: Negative for dysuria and urgency.  Musculoskeletal: Negative for myalgias and back pain.  Skin: Negative for itching and rash.  Neurological: Negative for dizziness, tingling, tremors and headaches.  Endo/Heme/Allergies: Negative for environmental allergies. Does not bruise/bleed easily.  Psychiatric/Behavioral: Negative for depression, suicidal ideas and substance abuse.   Blood pressure 143/70, pulse 69, temperature 97.6 F (36.4 C), temperature source Oral, resp. rate 18, height 5\' 4"  (1.626 m), weight 79.4 kg (175 lb 0.7 oz), SpO2 96.00%. Physical Exam  Nursing note and vitals reviewed. Constitutional: She is oriented to person, place, and time. She appears well-developed and well-nourished. No distress.  HENT:  Head: Normocephalic and atraumatic.  Nose: Nose normal.  Mouth/Throat: Oropharynx is clear and moist. No oropharyngeal exudate.   Eyes: Conjunctivae and EOM are normal. Pupils are equal, round, and reactive to light. No scleral icterus.  Neck: Normal range of motion. Neck supple. No JVD present. No tracheal deviation present. No thyromegaly present.  Cardiovascular: Intact distal pulses.  Exam reveals no gallop.   Murmur heard. Irregular irregular; diastolic murmur, systolic murmur at LSB  Respiratory: Effort normal and breath sounds normal. No respiratory distress. She has no wheezes. She has no rales.  GI: Soft. Bowel sounds are normal. She exhibits no distension. There is no tenderness. There is no rebound.  Musculoskeletal: Normal range of motion. She exhibits no edema and no tenderness.  Trace edema bilaterally  Neurological: She is alert and oriented to person, place, and time. No cranial nerve deficit. Coordination normal.  Skin: Skin is warm and dry. No rash noted. She is not diaphoretic. No erythema.  Psychiatric: She has a normal mood and affect. Her behavior is normal.  labs reviewed; cr 1.08, bun 14, K 4.5, na 131, trop 0.02, inr 1.6 Ecg reviewed; LBBB, irr irr (known), stable  St depressions laterally Chest x-ray: no acute process  9/10 Echo reviewed; EF 40-45%, mild AI, mild MR, mild LVH  Problem List Chest Pain  Atrial fibrillation on warfarin LBBB on ECG (seen previously) Hypertension COPD GERD Dyslipidemia LV EF 40-45%  Hypothyroidism  Assessment/Plan: 72 yo woman with PMH of atrial fibrillation on metoprolol/warfarin, LBBB known, hypertension, COPD, GERD, dyslipidemia, and last known EF 40-45% with atypical chest pain last several minutes, initial troponins negative and followed by Dr. Lovena Le. Differential diagnosis still remains costochondritis, GERD, ACS/NSTEMI, peptic ulcer disease among other etiologies. Favor atypical chest pain/GERD but given age, atrial fibrillation, no prior LHC, LV dysfunction, LBBB CAD still very possible. Given known mild AI, mild MR, echocardiogram reasonable study  and if she can walk then stress Echo might be preferred stress test in AM should cardiac markers remain negative.  - trend troponins, on telemetry - Stress Echocardiogram in AM given mild AI/MR seen in past if cardiac markers negative --> I have not placed stress test order given trending markers at this time - continue aspirin, metoprolol, warfarin, ace-, statin - please notify cardiology if change in symptoms, telemetry or biomarkers  Katie [name] 09/24/2012, 12:56 AM

## 2012-09-24 NOTE — Progress Notes (Addendum)
PROGRESS NOTE  Subjective:   Katie [name] is a 72 yo woman with PMH of atrial fibrillation, COPD, hypertension, LBBB on ECG and 9/10 echo with EF 40-45%, mild AI/mild MR, mild LVH followed by Dr. Lovena Le, on warfarin/metoprolol who had substernal chest pain with pressure sensation.  The pain started shortly after she had eaten some watermelon and was lying on the couch.  Not pleuritic,  Mid sternal chest pressure.   EMS gave her a NTG and the pain resolved quickly.  She has a hiatal hernia and has had food get stuck in the past.   She has eaten breakfast this am.  Objective:    Vital Signs:   Temp:  [97.6 F (36.4 C)-98 F (36.7 C)] 97.9 F (36.6 C) (07/13 0342) Pulse Rate:  [42-86] 57 (07/13 0342) Resp:  [14-26] 16 (07/13 0342) BP: (143-177)/(57-97) 144/67 mmHg (07/13 0342) SpO2:  [95 %-100 %] 95 % (07/13 0342) Weight:  [175 lb 0.7 oz (79.4 kg)] 175 lb 0.7 oz (79.4 kg) (07/13 0342)  Last BM Date: 09/23/12   24-hour weight change: Weight change:   Weight trends: Filed Weights   09/24/12 0016 09/24/12 0342  Weight: 175 lb 0.7 oz (79.4 kg) 175 lb 0.7 oz (79.4 kg)    Intake/Output:        Physical Exam: BP 144/67  Pulse 57  Temp(Src) 97.9 F (36.6 C) (Oral)  Resp 16  Ht 5\' 4"  (1.626 m)  Wt 175 lb 0.7 oz (79.4 kg)  BMI 30.03 kg/m2  SpO2 95%  General: Vital signs reviewed and noted.   Head: Normocephalic, atraumatic.  Eyes: conjunctivae/corneas clear.  EOM's intact.   Throat: normal  Neck:  normal  Lungs:    clear  Heart:  irregularly irregular. Soft systolic murmur.  Abdomen:  Soft, non-tender, non-distended    Extremities: No edema   Neurologic: A&O X3, CN II - XII are grossly intact.   Psych: Normal     Labs: BMET:  Recent Labs  09/23/12 1949 09/24/12 0130  NA 131* 129*  K 4.5 3.8  CL 95* 93*  CO2 28 29  GLUCOSE 105* 122*  BUN 14 14  CREATININE 1.08 1.17*  CALCIUM 9.8 9.5    Liver function tests: No results found for this basename: AST, ALT,  ALKPHOS, BILITOT, PROT, ALBUMIN,  in the last 72 hours No results found for this basename: LIPASE, AMYLASE,  in the last 72 hours  CBC:  Recent Labs  09/23/12 1949 09/24/12 0130  WBC 8.5 8.8  HGB 14.7 13.8  HCT 41.6 39.6  MCV 89.5 88.8  PLT 258 240    Cardiac Enzymes:  Recent Labs  09/24/12 0100  TROPONINI <0.30    Coagulation Studies:  Recent Labs  09/23/12 1949 09/24/12 0130  LABPROT 18.7* 19.5*  INR 1.61* 1.70*    Other: No components found with this basename: POCBNP,  No results found for this basename: DDIMER,  in the last 72 hours No results found for this basename: HGBA1C,  in the last 72 hours  Recent Labs  09/24/12 0130  CHOL 140  HDL 32*  LDLCALC 85  TRIG 116  CHOLHDL 4.4   No results found for this basename: TSH, T4TOTAL, FREET3, T3FREE, THYROIDAB,  in the last 72 hours No results found for this basename: VITAMINB12, FOLATE, FERRITIN, TIBC, IRON, RETICCTPCT,  in the last 72 hours   Other results:  Tele:  A-Fib with slow rate.   Medications:    Infusions:    Scheduled  Medications: . allopurinol  300 mg Oral QODAY  . aspirin EC  81 mg Oral Daily  . benazepril  10 mg Oral Daily  . digoxin  250 mcg Oral Daily  . enoxaparin (LOVENOX) injection  1 mg/kg Subcutaneous Q12H  . furosemide  40 mg Oral Daily  . gabapentin  300 mg Oral TID  . levothyroxine  50 mcg Oral QHS  . metoprolol tartrate  12.5 mg Oral BID  . metroNIDAZOLE  250 mg Oral TID  . roflumilast  500 mcg Oral Daily  . sodium chloride  3 mL Intravenous Q12H  . warfarin  2 mg Oral q1800  . Warfarin - Physician Dosing Inpatient   Does not apply q1800    Assessment/ Plan:     1.   Chest pain:  The CP was mid sternal and resolved quickly with NTG.  Concerning for UAP.  Also could be esophageal.  She has a hiatal hernia.  Troponin levels are negative.  She has a hx of PVD ( AAA)   She has eaten today and will not be able to do a myoview.  Will schedule her for a Lexiscan ( or ?  Adenosine since she has a LBBB) myoview for tomorrow.    2.  HTN  - BP is fairly well controlled.      3.  Atrial fibrillation- rate is OK / slow.  INR is slightly subtheraputic.  Continue current meds.  4.  AAA - stable by recent duplex scan   Disposition: for [employer] tomorrow.  Length of Stay: 1  Thayer Headings, Brooke Bonito., MD, [facility] 09/24/2012, 8:39 AM Office [phone] Pager [phone]

## 2012-09-24 NOTE — Progress Notes (Signed)
ANTICOAGULATION CONSULT NOTE - Initial Consult  Pharmacy Consult for LMWH Indication: chest pain/ACS  Allergies  Allergen Reactions  . Codeine     unknown    Patient Measurements: Height: 5\' 4"  (162.6 cm) Weight: 175 lb 0.7 oz (79.4 kg) IBW/kg (Calculated) : 54.7   Vital Signs: Temp: 97.6 F (36.4 C) (07/13 0016) Temp src: Oral (07/13 0016) BP: 143/70 mmHg (07/13 0016) Pulse Rate: 69 (07/13 0016)  Labs:  Recent Labs  09/23/12 1949  HGB 14.7  HCT 41.6  PLT 258  LABPROT 18.7*  INR 1.61*  CREATININE 1.08    Estimated Creatinine Clearance: 49.4 ml/min (by C-G formula based on Cr of 1.08).   Medical History: Past Medical History  Diagnosis Date  . Atrial fibrillation   . COPD (chronic obstructive pulmonary disease)   . Bronchitis, chronic   . History of Jaymeson Mengel's palsy   . Hypertension   . Coronary artery disease   . Migraine headache   . Atrial flutter   . Tobacco abuse   . Osteopenia   . Cervical dysplasia   . Thyroid disease     Hypo  . Elevated cholesterol     Medications:  Prescriptions prior to admission  Medication Sig Dispense Refill  . allopurinol (ZYLOPRIM) 300 MG tablet Take 300 mg by mouth every other day.       Marland Kitchen aspirin 81 MG tablet Take 81 mg by mouth daily.        . benazepril (LOTENSIN) 20 MG tablet Take 10 mg by mouth daily.       . digoxin (LANOXIN) 0.25 MG tablet Take 250 mcg by mouth daily.       . furosemide (LASIX) 80 MG tablet Take 40 mg by mouth daily. daily unless Blood Pressure is under 100      . gabapentin (NEURONTIN) 300 MG capsule Take 300 mg by mouth 3 (three) times daily.      Marland Kitchen gentamicin (GARAMYCIN) 0.3 % ophthalmic solution Place 1 drop into both eyes daily as needed. For red eye      . levothyroxine (SYNTHROID, LEVOTHROID) 50 MCG tablet Take 50 mcg by mouth at bedtime.       . metroNIDAZOLE (FLAGYL) 250 MG tablet Take 250 mg by mouth 3 (three) times daily. Started September 19, 2012 take for 7 days      . montelukast  (SINGULAIR) 10 MG tablet Take 10 mg by mouth daily as needed. For allergy      . pravastatin (PRAVACHOL) 40 MG tablet Take 40 mg by mouth at bedtime.        . prochlorperazine (COMPAZINE) 5 MG tablet Take 5 mg by mouth every 6 (six) hours as needed. Dizzy      . roflumilast (DALIRESP) 500 MCG TABS tablet Take 500 mcg by mouth daily.      Marland Kitchen warfarin (COUMADIN) 2 MG tablet Take 2 mg by mouth daily.          Assessment: Katie [name] is a 72 yo F admitted with CP to start [place] per pharmacy.  She is on coumadin a home on 2 mg daily with last dose taken 7/12. She is on day 6/7 of flagyl.  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 I paged Dr. Shanon Brow and discussed this with her.  Pt is on 7 day course of flagyl started 7/8 and to end 7/13 and INR is 1.6.  She wants me  to put in stop date for flagyl to end after 1 more day and dose LMWH to cover pt with chest pain.  She does not want to hold coumadin b/c she does not think pt will need any invasive procedures.  CBC wnl.  Creat 1.08.  Wt 79.4 kg.  Goal of Therapy:  Anti-Xa level 0.6-1.2 units/ml 4hrs after LMWH dose given Monitor platelets by anticoagulation protocol: Yes   Plan:  1. LMWH 1 mg/kg = 80 mg sq q12h 2. Continue home dose of coumadin 2 mg per day per MD dosing 3. Flagyl 7 day course to be completed 7/13 pm. Eudelia Bunch, Pharm.D. QP:[id number] 09/24/2012 12:47 AM

## 2012-09-25 ENCOUNTER — Observation Stay (HOSPITAL_COMMUNITY): Payer: Medicare Other

## 2012-09-25 DIAGNOSIS — R079 Chest pain, unspecified: Secondary | ICD-10-CM

## 2012-09-25 LAB — BASIC METABOLIC PANEL
BUN: 13 mg/dL (ref 6–23)
CO2: 24 mEq/L (ref 19–32)
Calcium: 9.3 mg/dL (ref 8.4–10.5)
GFR calc non Af Amer: 59 mL/min — ABNORMAL LOW (ref >90)
Glucose, Bld: 101 mg/dL — ABNORMAL HIGH (ref 70–99)
Sodium: 127 mEq/L — ABNORMAL LOW (ref 135–145)

## 2012-09-25 MED ORDER — TECHNETIUM TC 99M SESTAMIBI GENERIC - CARDIOLITE
30.0000 | Freq: Once | INTRAVENOUS | Status: AC | PRN
  Administered 2012-09-25: 30 via INTRAVENOUS

## 2012-09-25 MED ORDER — PANTOPRAZOLE SODIUM 40 MG PO TBEC: 40.0000 mg | DELAYED_RELEASE_TABLET | Freq: Every day | ORAL | Status: DC

## 2012-09-25 MED ORDER — METOPROLOL TARTRATE 12.5 MG HALF TABLET: 12.5000 mg | ORAL_TABLET | Freq: Two times a day (BID) | ORAL | Status: DC

## 2012-09-25 MED ORDER — TECHNETIUM TC 99M SESTAMIBI GENERIC - CARDIOLITE
10.0000 | Freq: Once | INTRAVENOUS | Status: AC | PRN
  Administered 2012-09-25: 10 via INTRAVENOUS

## 2012-09-25 MED ORDER — ENOXAPARIN SODIUM 80 MG/0.8ML ~~LOC~~ SOLN: 1.0000 mg/kg | Freq: Two times a day (BID) | SUBCUTANEOUS | Status: DC

## 2012-09-25 NOTE — Progress Notes (Signed)
Reviewed discharge instructions with patient and she stated her understanding.  Patient states she has taken metoprolol in the past and it has given her a bad cough, instructed to followup with Dr. Lovena Le in the morning.  Also instructed patient to followup with coumadin clinic this week instead of next week to have INR checked.  Spoke with Dr. Caryl Comes and he instructed patient did not need to go home on sq lovenox.  Patient discharged via wheelchair home with husband.  Sanda Linger

## 2012-09-25 NOTE — Progress Notes (Signed)
Patient: Katie [name] / Admit Date: 09/23/2012 / Date of Encounter: 09/25/2012, 8:53 AM   Subjective  No chest pain or SOB.   Objective   Telemetry: will need to be reviewed by MD once pt is back on floor (seen initially in nuc lab) Physical Exam: Filed Vitals:   09/25/12 0838  BP: 152/77  Pulse: 61  Temp: 97.8  Resp: 18   General: Well developed, well nourished, in no acute distress. Head: Normocephalic, atraumatic, sclera non-icteric, no xanthomas, nares are without discharge. Cataract L eye Neck:  JVD not elevated. Lungs: Clear bilaterally to auscultation without wheezes, rales, or rhonchi. Breathing is unlabored. Heart: Irregularly irregular, controlled rate, S1 S2 2/6SEM.  Abdomen: Soft, non-tender, non-distended with normoactive bowel sounds. No hepatomegaly. No rebound/guarding. No obvious abdominal masses. Msk:  Strength and tone appear normal for age. Extremities: No clubbing or cyanosis. No edema.  Distal pedal pulses are 2+ and equal bilaterally. Neuro: Alert and oriented X 3. Moves all extremities spontaneously. Psych:  Responds to questions appropriately with a normal affect.    Intake/Output Summary (Last 24 hours) at 09/25/12 0853 Last data filed at 09/24/12 1745  Gross per 24 hour  Intake    840 ml  Output      0 ml  Net    840 ml    Inpatient Medications:  . allopurinol  300 mg Oral QODAY  . aspirin EC  81 mg Oral Daily  . benazepril  10 mg Oral Daily  . digoxin  250 mcg Oral Daily  . enoxaparin (LOVENOX) injection  1 mg/kg Subcutaneous Q12H  . furosemide  40 mg Oral Daily  . gabapentin  300 mg Oral TID  . levothyroxine  50 mcg Oral QHS  . metoprolol tartrate  12.5 mg Oral BID  . roflumilast  500 mcg Oral Daily  . sodium chloride  3 mL Intravenous Q12H  . warfarin  2 mg Oral q1800  . Warfarin - Physician Dosing Inpatient   Does not apply q1800    Labs:  Recent Labs  09/23/12 1949 09/24/12 0130  NA 131* 129*  K 4.5 3.8  CL 95* 93*  CO2 28  29  GLUCOSE 105* 122*  BUN 14 14  CREATININE 1.08 1.17*  CALCIUM 9.8 9.5   No results found for this basename: AST, ALT, ALKPHOS, BILITOT, PROT, ALBUMIN,  in the last 72 hours  Recent Labs  09/23/12 1949 09/24/12 0130  WBC 8.5 8.8  HGB 14.7 13.8  HCT 41.6 39.6  MCV 89.5 88.8  PLT 258 240    Recent Labs  09/24/12 0100  TROPONINI <0.30   No components found with this basename: POCBNP,   Recent Labs  09/24/12 0100  HGBA1C 5.4     Radiology/Studies:  Dg Chest 2 View  09/23/2012   *RADIOLOGY REPORT*  Clinical Data: Chest pain today.  History of hypertension.  Spine history of smoking.  CHEST - 2 VIEW  Comparison: 10/24/2009  Findings: Cardiac enlargement with normal pulmonary vascularity. Hyperinflation suggesting emphysema.  Chronic bronchitic changes. No focal consolidation or airspace disease.  No blunting of costophrenic angles.  No pneumothorax.  Calcified and tortuous aorta.  Calcified granulomas in the right lung.  Degenerative changes in the spine.  No significant change since previous study.  IMPRESSION: Emphysematous and chronic bronchitic changes.  Cardiac enlargement. No evidence of active disease.   Original Report Authenticated By: Lucienne Capers, M.D.     Assessment and Plan  1. Chest pain - cardiolite performed.  Pt also has h/o hiatal hernia and PVD (AAA). Will defer to MD regarding updating echo. Stress test results pending.  2. HTN - follow. 3. Atrial fibrillation - rate controlled. It appears she is being covered with Lovenox while subtherapeutic on Coumadin. 4. Chronic systolic CHF EF - appears compensated. 5. Hyponatremia - ? Etiology. 6. AAA - Dr.Taylor plans to re-eval in 12/2012.  Pt seen briefly in nuclear lab. Further recs per Dr. Lovena Le.  Signed, Burna Mortimer    Cardiology Attending  Patient seen and examined. Reviewed stress test. No ischemia. I suspect she has breast attenuation. She has a long standing non-ischemic CM which has been  well compensated and partially rate related. Her pain is atypical. I would recommend discharge home. No other change in meds. I would discharge on acid supression medications. [place] for discharge from my perspective.  Mikle Bosworth.D.

## 2012-09-25 NOTE — Progress Notes (Signed)
[facility] PROGRESS NOTE  Katie [name] [id number] DOB: 27-Oct-1940 DOA: 09/23/2012 PCP: Katie Richards, MD  Assessment/Plan: 1. Chest pain: resolved with NTG. On telemonitor. myo view in am was abnormal with small anterior an d septal wall infarct, but there was no ischemia. Katie [name] came and recommended no further work up needed and recommended discharge home with outpatient follow up.   2. Atrial fibrillation : rate controlled on chronic anticoagulation. inr is subtherapeutic she was on lovenox and coumadin while hospitalization, but was discharged on coumadin only as per Katie [name] recommendations.   3. Hypertension: better controlled.   4. Hyponatremia: not fluid overloaded. ? Chronic, recommend outpatient follow up with a BMP in 3 days.   5. AAA: stable on scan.   6. Chronic systolic heart failure; appears to be compensate.d   7. Vertigo: stable.   dvt prophylaxis.   Code Status: full code Family Communication: none at bedside Disposition Plan: pending. Possibly home if stress negative.    Consultants:  cardiology  Procedures:  Myoview in am.   HPI/Subjective: Chest pain free, dry and itching eyes, and slightly dizzy.   Objective: Filed Vitals:   09/25/12 0838 09/25/12 0907 09/25/12 0909 09/25/12 0911  BP: 152/77 163/94 154/92 150/89  Pulse: 61 92 110 90  Temp:      TempSrc:      Resp: 18     Height:      Weight:      SpO2:        Intake/Output Summary (Last 24 hours) at 09/25/12 1223 Last data filed at 09/25/12 1055  Gross per 24 hour  Intake    603 ml  Output      0 ml  Net    603 ml   Filed Weights   09/24/12 0016 09/24/12 0342 09/25/12 0541  Weight: 79.4 kg (175 lb 0.7 oz) 79.4 kg (175 lb 0.7 oz) 79.7 kg (175 lb 11.3 oz)    Exam:   General:  Alert afebrile comfortable  Cardiovascular: s1s2  Respiratory: CTAB  Abdomen: soft NT ND BS+  Musculoskeletal:  No pedal edema.   Data Reviewed: Basic Metabolic  Panel:  Recent Labs Lab 09/23/12 1949 09/24/12 0130 09/25/12 1118  NA 131* 129* 127*  K 4.5 3.8 4.3  CL 95* 93* 94*  CO2 28 29 24   GLUCOSE 105* 122* 101*  BUN 14 14 13   CREATININE 1.08 1.17* 0.96  CALCIUM 9.8 9.5 9.3   Liver Function Tests: No results found for this basename: AST, ALT, ALKPHOS, BILITOT, PROT, ALBUMIN,  in the last 168 hours No results found for this basename: LIPASE, AMYLASE,  in the last 168 hours No results found for this basename: AMMONIA,  in the last 168 hours CBC:  Recent Labs Lab 09/23/12 1949 09/24/12 0130  WBC 8.5 8.8  HGB 14.7 13.8  HCT 41.6 39.6  MCV 89.5 88.8  PLT 258 240   Cardiac Enzymes:  Recent Labs Lab 09/24/12 0100  TROPONINI <0.30   BNP (last 3 results) No results found for this basename: PROBNP,  in the last 8760 hours CBG: No results found for this basename: GLUCAP,  in the last 168 hours  No results found for this or any previous visit (from the past 240 hour(s)).   Studies: Dg Chest 2 View  09/23/2012   *RADIOLOGY REPORT*  Clinical Data: Chest pain today.  History of hypertension.  Spine history of smoking.  CHEST - 2 VIEW  Comparison: 10/24/2009  Findings: Cardiac enlargement  with normal pulmonary vascularity. Hyperinflation suggesting emphysema.  Chronic bronchitic changes. No focal consolidation or airspace disease.  No blunting of costophrenic angles.  No pneumothorax.  Calcified and tortuous aorta.  Calcified granulomas in the right lung.  Degenerative changes in the spine.  No significant change since previous study.  IMPRESSION: Emphysematous and chronic bronchitic changes.  Cardiac enlargement. No evidence of active disease.   Original Report Authenticated By: Katie [name], M.D.   Nm Myocar Multi W/spect W/wall Motion / Ef  09/25/2012   Lexiscan Myovue:  Indication chest pain:  Protocol:  The patient received .4 mg of lexiscan as a bolus 10 mCi Tc44m given at rest and 30 with stress Baseline ECG afib with LBBB. No  significant ECG changes or symptoms with infusion  The resting and stress images were reconstructed in the vertical horizontal and short axis planes  There was a small distal anterior wall infarct and septal infarct with no ischemia  Surface images showed apical hypokinesis EF 50%  Impression: Abnormal myovue with small anterior and septal wall infarct no ischemia  EF 50% with apical hypokinesis  EF may not be accurate due to afib  Katie Rouge MD [place] Report Authenticated By: Katie [name], M.D.    Scheduled Meds: . allopurinol  300 mg Oral QODAY  . aspirin EC  81 mg Oral Daily  . benazepril  10 mg Oral Daily  . digoxin  250 mcg Oral Daily  . enoxaparin (LOVENOX) injection  1 mg/kg Subcutaneous Q12H  . furosemide  40 mg Oral Daily  . gabapentin  300 mg Oral TID  . levothyroxine  50 mcg Oral QHS  . metoprolol tartrate  12.5 mg Oral BID  . roflumilast  500 mcg Oral Daily  . sodium chloride  3 mL Intravenous Q12H  . warfarin  2 mg Oral q1800  . Warfarin - Physician Dosing Inpatient   Does not apply q1800   Continuous Infusions:   Principal Problem:   Chest pain Active Problems:   HYPERTENSION   CAD   Atrial fibrillation   CONGESTIVE HEART FAILURE UNSPECIFIED   ABDOMINAL AORTIC ANEURYSM   COPD    Time spent: 25 min    Katie [name]  [facility] Pager [phone]. If 7PM-7AM, please contact night-coverage at [email or web address], password [facility] 09/25/2012, 12:23 PM  LOS: 2 days

## 2012-09-26 NOTE — Discharge Summary (Signed)
Physician Discharge Summary  Katie [name] [id number] DOB: 19-Nov-1940 DOA: 09/23/2012  PCP: Katie Richards, MD  Admit date: 09/23/2012 Discharge date: 09/26/2012  Time spent: 28 minutes  Recommendations for Outpatient Follow-up:  1. Follow up with BMP in 2 to 3 days.  2. Follow up with pPCP in one week.  3. follo p with cardiology as recommended.   Discharge Diagnoses:  Principal Problem:   Chest pain Active Problems:   HYPERTENSION   CAD   Atrial fibrillation   CONGESTIVE HEART FAILURE UNSPECIFIED   ABDOMINAL AORTIC ANEURYSM   COPD   Discharge Condition: improved  Filed Weights   09/24/12 0016 09/24/12 0342 09/25/12 0541  Weight: 79.4 kg (175 lb 0.7 oz) 79.4 kg (175 lb 0.7 oz) 79.7 kg (175 lb 11.3 oz)    History of present illness:  72 yo female with h/o copd, htn, no prev cad, afib comes in with sscp with no radiation that started earlier this afternoon. Feels more like pressure sensation, not sharp. No abd pain. No sob. Some nausea no vomiting. Has h/o hiatal hernia, says it does not feel like her normal gerd. No cough. No fevers.she was admitted to hospitalist service for evaluation of ACS.   Hospital Course:  1. Atypical Chest pain: resolved with NTG. On telemonitor. myo view in am was abnormal with small anterior an d septal wall infarct, but there was no ischemia. Dr Lovena Le came and recommended no further work up needed and recommended discharge home with outpatient follow up.  2. Atrial fibrillation : rate controlled on chronic anticoagulation. inr is subtherapeutic she was on lovenox and coumadin while hospitalization, but was discharged on coumadin only as per Dr Tanna Furry recommendations.  3. Hypertension: better controlled.  4. Hyponatremia: not fluid overloaded. ? Chronic, recommend outpatient follow up with a BMP in 3 days.  5. AAA: stable on scan.  6. Chronic systolic heart failure; appears to be compensate.d  7. Vertigo: stable.       Procedures:  MYoview  Consultations:  cardiology  Discharge Exam: Filed Vitals:   09/25/12 0907 09/25/12 0909 09/25/12 0911 09/25/12 1429  BP: 163/94 154/92 150/89 144/79  Pulse: 92 110 90 80  Temp:      TempSrc:      Resp:    18  Height:      Weight:      SpO2:    99%    General: Alert afebrile comfortable Cardiovascular: s1s2 Respiratory: CTAB Abdomen: soft NT ND BS+ Musculoskeletal: No pedal edema.    Discharge Instructions  Discharge Orders   Future Appointments Provider Department Dept Phone   11/10/2012 1:45 PM [name] Lance, MD [facility] [place]) [phone]   Future Orders Complete By Expires     Diet - low sodium heart healthy  As directed     Discharge instructions  As directed     Comments:      Follow up with cardiology as recommended        Medication List    STOP taking these medications       metroNIDAZOLE 250 MG tablet  Commonly known as:  FLAGYL      TAKE these medications       allopurinol 300 MG tablet  Commonly known as:  ZYLOPRIM  Take 300 mg by mouth every other day.     aspirin 81 MG tablet  Take 81 mg by mouth daily.     benazepril 20 MG tablet  Commonly known as:  LOTENSIN  Take 10 mg by mouth daily.     DALIRESP 500 MCG Tabs tablet  Generic drug:  roflumilast  Take 500 mcg by mouth daily.     digoxin 0.25 MG tablet  Commonly known as:  LANOXIN  Take 250 mcg by mouth daily.     furosemide 80 MG tablet  Commonly known as:  LASIX  Take 40 mg by mouth daily. daily unless Blood Pressure is under 100     gabapentin 300 MG capsule  Commonly known as:  NEURONTIN  Take 300 mg by mouth 3 (three) times daily.     gentamicin 0.3 % ophthalmic solution  Commonly known as:  GARAMYCIN  Place 1 drop into both eyes daily as needed. For red eye     levothyroxine 50 MCG tablet  Commonly known as:  SYNTHROID, LEVOTHROID  Take 50 mcg by mouth at bedtime.     metoprolol tartrate 12.5 mg Tabs  Commonly  known as:  LOPRESSOR  Take 0.5 tablets (12.5 mg total) by mouth 2 (two) times daily.     montelukast 10 MG tablet  Commonly known as:  SINGULAIR  Take 10 mg by mouth daily as needed. For allergy     pantoprazole 40 MG tablet  Commonly known as:  PROTONIX  Take 1 tablet (40 mg total) by mouth daily.     pravastatin 40 MG tablet  Commonly known as:  PRAVACHOL  Take 40 mg by mouth at bedtime.     prochlorperazine 5 MG tablet  Commonly known as:  COMPAZINE  Take 5 mg by mouth every 6 (six) hours as needed. Dizzy     warfarin 2 MG tablet  Commonly known as:  COUMADIN  Take 2 mg by mouth daily.       Allergies  Allergen Reactions  . Codeine     unknown       Follow-up Information   Follow up with Katie Richards, MD. Schedule an appointment as soon as possible for a visit in 1 week.   Contact information:   [address] [place] [address] [phone]        The results of significant diagnostics from this hospitalization (including imaging, microbiology, ancillary and laboratory) are listed below for reference.    Significant Diagnostic Studies: Dg Chest 2 View  09/23/2012   *RADIOLOGY REPORT*  Clinical Data: Chest pain today.  History of hypertension.  Spine history of smoking.  CHEST - 2 VIEW  Comparison: 10/24/2009  Findings: Cardiac enlargement with normal pulmonary vascularity. Hyperinflation suggesting emphysema.  Chronic bronchitic changes. No focal consolidation or airspace disease.  No blunting of costophrenic angles.  No pneumothorax.  Calcified and tortuous aorta.  Calcified granulomas in the right lung.  Degenerative changes in the spine.  No significant change since previous study.  IMPRESSION: Emphysematous and chronic bronchitic changes.  Cardiac enlargement. No evidence of active disease.   Original Report Authenticated By: Lucienne Capers, M.D.   Nm Myocar Multi W/spect W/wall Motion / Ef  09/25/2012   Lexiscan Myovue:  Indication  chest pain:  Protocol:  The patient received .4 mg of lexiscan as a bolus 10 mCi Tc15m given at rest and 30 with stress Baseline ECG afib with LBBB. No significant ECG changes or symptoms with infusion  The resting and stress images were reconstructed in the vertical horizontal and short axis planes  There was a small distal anterior wall infarct and septal infarct with no ischemia  Surface images showed apical hypokinesis EF 50%  Impression: Abnormal myovue with small anterior and septal wall infarct no ischemia  EF 50% with apical hypokinesis  EF may not be accurate due to afib  Jenkins Rouge MD [place] By: Jenkins Rouge, M.D.    Microbiology: No results found for this or any previous visit (from the past 240 hour(s)).   Labs: Basic Metabolic Panel:  Recent Labs Lab 09/23/12 1949 09/24/12 0130 09/25/12 1118  NA 131* 129* 127*  K 4.5 3.8 4.3  CL 95* 93* 94*  CO2 28 29 24   GLUCOSE 105* 122* 101*  BUN 14 14 13   CREATININE 1.08 1.17* 0.96  CALCIUM 9.8 9.5 9.3   Liver Function Tests: No results found for this basename: AST, ALT, ALKPHOS, BILITOT, PROT, ALBUMIN,  in the last 168 hours No results found for this basename: LIPASE, AMYLASE,  in the last 168 hours No results found for this basename: AMMONIA,  in the last 168 hours CBC:  Recent Labs Lab 09/23/12 1949 09/24/12 0130  WBC 8.5 8.8  HGB 14.7 13.8  HCT 41.6 39.6  MCV 89.5 88.8  PLT 258 240   Cardiac Enzymes:  Recent Labs Lab 09/24/12 0100  TROPONINI <0.30   BNP: BNP (last 3 results) No results found for this basename: PROBNP,  in the last 8760 hours CBG: No results found for this basename: GLUCAP,  in the last 168 hours     Signed:  Shelton Soler  [facility] 09/26/2012, 6:43 PM

## 2012-10-18 ENCOUNTER — Other Ambulatory Visit: Payer: Self-pay

## 2012-11-01 ENCOUNTER — Encounter: Payer: Self-pay | Admitting: Internal Medicine

## 2012-11-10 ENCOUNTER — Encounter: Payer: Self-pay | Admitting: Internal Medicine

## 2012-11-10 ENCOUNTER — Ambulatory Visit (INDEPENDENT_AMBULATORY_CARE_PROVIDER_SITE_OTHER): Payer: Medicare Other | Admitting: Internal Medicine

## 2012-11-10 VITALS — BP 138/60 | HR 53 | Ht 64.0 in | Wt 170.8 lb

## 2012-11-10 DIAGNOSIS — I4891 Unspecified atrial fibrillation: Secondary | ICD-10-CM

## 2012-11-10 DIAGNOSIS — I509 Heart failure, unspecified: Secondary | ICD-10-CM

## 2012-11-10 DIAGNOSIS — I714 Abdominal aortic aneurysm, without rupture: Secondary | ICD-10-CM

## 2012-11-10 NOTE — Assessment & Plan Note (Signed)
Her ventricular rate is well controlled and atrial fibrillation. She will continue her anticoagulation.

## 2012-11-10 NOTE — Progress Notes (Signed)
HPI Katie [name] returns today for followup. She is a very pleasant 72 year old woman with a history of chronic atrial fibrillation, class II heart failure,  Both systolic and diastolic, abdominal aortic aneurysm, and hypertension. In the interim, she has been stable. She was hospitalized approximately 2 months ago, and has done well since then. She has class II heart failure symptoms as previously noted and no syncope. Allergies  Allergen Reactions  . Codeine     unknown     Current Outpatient Prescriptions  Medication Sig Dispense Refill  . allopurinol (ZYLOPRIM) 300 MG tablet Take 300 mg by mouth every other day.       Marland Kitchen aspirin 81 MG tablet Take 81 mg by mouth daily.        . benazepril (LOTENSIN) 20 MG tablet Take 10 mg by mouth daily.       . bisoprolol-hydrochlorothiazide (ZIAC) 5-6.25 MG per tablet Take 1 tablet by mouth daily.       . digoxin (LANOXIN) 0.25 MG tablet Take 250 mcg by mouth daily.       . furosemide (LASIX) 80 MG tablet Take 40 mg by mouth daily. daily unless Blood Pressure is under 100      . gabapentin (NEURONTIN) 300 MG capsule Take 300 mg by mouth 3 (three) times daily.      Marland Kitchen levothyroxine (SYNTHROID, LEVOTHROID) 50 MCG tablet Take 50 mcg by mouth at bedtime.       . montelukast (SINGULAIR) 10 MG tablet Take 10 mg by mouth daily as needed. For allergy      . pantoprazole (PROTONIX) 40 MG tablet Take 1 tablet (40 mg total) by mouth daily.  30 tablet  0  . pravastatin (PRAVACHOL) 40 MG tablet Take 40 mg by mouth at bedtime.        . prochlorperazine (COMPAZINE) 5 MG tablet Take 5 mg by mouth every 6 (six) hours as needed. Dizzy      . roflumilast (DALIRESP) 500 MCG TABS tablet Take 500 mcg by mouth daily.      Marland Kitchen warfarin (COUMADIN) 2 MG tablet Take 2 mg by mouth daily.         No current facility-administered medications for this visit.     Past Medical History  Diagnosis Date  . Atrial fibrillation   . COPD (chronic obstructive pulmonary disease)   .  Bronchitis, chronic   . History of Bell's palsy   . Hypertension   . Coronary artery disease   . Migraine headache   . Atrial flutter   . Tobacco abuse   . Osteopenia   . Cervical dysplasia   . Thyroid disease     Hypo  . Elevated cholesterol     ROS:   All systems reviewed and negative except as noted in the HPI.   Past Surgical History  Procedure Laterality Date  . Cholecystectomy    . Broken leg    . Laser vein procedure    . Refractive surgery    . Colposcopy       Family History  Problem Relation Age of Onset  . Cirrhosis Mother   . Heart defect Sister   . Breast cancer Sister     age 47     History   Social History  . Marital Status: Married    Spouse Name: N/A    Number of Children: N/A  . Years of Education: N/A   Occupational History  . Not on file.   Social History Main  Topics  . Smoking status: Former Smoker    Quit date: 08/10/2002  . Smokeless tobacco: Never Used     Comment: History of tobacco abuse  . Alcohol Use: 0.0 oz/week     Comment: rare  . Drug Use: No  . Sexual Activity: Yes    Birth Control/ Protection: Post-menopausal   Other Topics Concern  . Not on file   Social History Narrative   Lives in [place] with husband   One daughter     BP 138/60  Pulse 53  Ht 5\' 4"  (1.626 m)  Wt 170 lb 12.8 oz (77.474 kg)  BMI 29.3 kg/m2  Physical Exam:  Well appearing 72 year old woman, NAD HEENT: Unremarkable Neck:  7 cm JVD, no thyromegally Back:  No CVA tenderness Lungs:  Clear except for scattered rales in the bases. No wheezes or rhonchi. HEART:  IRegular rate rhythm, grade 2/6 systolic murmurs, no rubs, no clicks, her S2 is reduced. Abd:  soft, positive bowel sounds, no organomegally, no rebound, no guarding, a bruit is present Ext:  2 plus pulses, no edema, no cyanosis, no clubbing Skin:  No rashes no nodules Neuro:  CN II through XII intact, motor grossly intact  EKG - atrial fibrillation with a controlled  ventricular response and left bundle branch block.  Assess/Plan:

## 2012-11-10 NOTE — Assessment & Plan Note (Signed)
She has chronic systolic and diastolic heart failure, and class II symptoms, which are multifactorial. The patient has a worsening murmur on exam and a reduction in S2, concerning for progression of aortic stenosis. We will have her undergo 2-D echocardiography to evaluate her left ventricular function and the significance of her aortic stenosis/sclerosis.

## 2012-11-10 NOTE — Assessment & Plan Note (Signed)
She does not have abdominal pain. Her last ultrasound suggested enlargement of her aneurysm, and we will repeat this evaluation in the next few weeks.

## 2012-11-10 NOTE — Patient Instructions (Addendum)
Your physician wants you to follow-up in: 6 months with Dr  Knox Saliva will receive a reminder letter in the mail two months in advance. If you don't receive a letter, please call our office to schedule the follow-up appointment.  Your physician has requested that you have an echocardiogram. Echocardiography is a painless test that uses sound waves to create images of your heart. It provides your doctor with information about the size and shape of your heart and how well your heart's chambers and valves are working. This procedure takes approximately one hour. There are no restrictions for this procedure.  Your physician has requested that you have an abdominal aorta duplex. During this test, an ultrasound is used to evaluate the aorta. Allow 30 minutes for this exam. Do not eat after midnight the day before and avoid carbonated beverages

## 2012-11-16 ENCOUNTER — Ambulatory Visit (HOSPITAL_COMMUNITY): Payer: Medicare Other | Attending: Cardiology

## 2012-11-16 DIAGNOSIS — I10 Essential (primary) hypertension: Secondary | ICD-10-CM | POA: Insufficient documentation

## 2012-11-16 DIAGNOSIS — I379 Nonrheumatic pulmonary valve disorder, unspecified: Secondary | ICD-10-CM | POA: Insufficient documentation

## 2012-11-16 DIAGNOSIS — I4891 Unspecified atrial fibrillation: Secondary | ICD-10-CM | POA: Insufficient documentation

## 2012-11-16 DIAGNOSIS — I2510 Atherosclerotic heart disease of native coronary artery without angina pectoris: Secondary | ICD-10-CM | POA: Insufficient documentation

## 2012-11-16 DIAGNOSIS — I714 Abdominal aortic aneurysm, without rupture, unspecified: Secondary | ICD-10-CM | POA: Insufficient documentation

## 2012-11-16 DIAGNOSIS — J449 Chronic obstructive pulmonary disease, unspecified: Secondary | ICD-10-CM | POA: Insufficient documentation

## 2012-11-16 DIAGNOSIS — J4489 Other specified chronic obstructive pulmonary disease: Secondary | ICD-10-CM | POA: Insufficient documentation

## 2012-11-16 DIAGNOSIS — I079 Rheumatic tricuspid valve disease, unspecified: Secondary | ICD-10-CM | POA: Insufficient documentation

## 2012-11-16 DIAGNOSIS — I059 Rheumatic mitral valve disease, unspecified: Secondary | ICD-10-CM | POA: Insufficient documentation

## 2012-11-16 DIAGNOSIS — I509 Heart failure, unspecified: Secondary | ICD-10-CM | POA: Insufficient documentation

## 2012-11-16 DIAGNOSIS — Z87891 Personal history of nicotine dependence: Secondary | ICD-10-CM | POA: Insufficient documentation

## 2012-11-16 NOTE — Progress Notes (Signed)
Echocardiogram performed.  

## 2012-11-20 ENCOUNTER — Telehealth: Payer: Self-pay | Admitting: Internal Medicine

## 2012-11-20 LAB — FECAL OCCULT BLOOD, GUAIAC: Fecal Occult Blood: NEGATIVE

## 2012-11-20 NOTE — Telephone Encounter (Signed)
New problem    Calling for echo test results.

## 2012-12-05 NOTE — Telephone Encounter (Signed)
Patient called.

## 2013-01-01 ENCOUNTER — Ambulatory Visit (HOSPITAL_COMMUNITY): Payer: Medicare Other | Attending: Cardiology

## 2013-01-01 DIAGNOSIS — I10 Essential (primary) hypertension: Secondary | ICD-10-CM | POA: Insufficient documentation

## 2013-01-01 DIAGNOSIS — J4489 Other specified chronic obstructive pulmonary disease: Secondary | ICD-10-CM | POA: Insufficient documentation

## 2013-01-01 DIAGNOSIS — E785 Hyperlipidemia, unspecified: Secondary | ICD-10-CM | POA: Insufficient documentation

## 2013-01-01 DIAGNOSIS — Z87891 Personal history of nicotine dependence: Secondary | ICD-10-CM | POA: Insufficient documentation

## 2013-01-01 DIAGNOSIS — I714 Abdominal aortic aneurysm, without rupture, unspecified: Secondary | ICD-10-CM | POA: Insufficient documentation

## 2013-01-01 DIAGNOSIS — J449 Chronic obstructive pulmonary disease, unspecified: Secondary | ICD-10-CM | POA: Insufficient documentation

## 2013-01-01 DIAGNOSIS — I2510 Atherosclerotic heart disease of native coronary artery without angina pectoris: Secondary | ICD-10-CM | POA: Insufficient documentation

## 2013-01-02 ENCOUNTER — Telehealth: Payer: Self-pay | Admitting: *Deleted

## 2013-01-02 DIAGNOSIS — I714 Abdominal aortic aneurysm, without rupture, unspecified: Secondary | ICD-10-CM

## 2013-01-02 NOTE — Telephone Encounter (Signed)
Left patient a message to call me in regards to her ultrasound.  She will need to be referred to the vascular surgeon

## 2013-01-03 ENCOUNTER — Encounter: Payer: Self-pay | Admitting: Internal Medicine

## 2013-01-03 NOTE — Telephone Encounter (Signed)
Follow UP:  Pt states she is calling for test results of her CT. Pt states it is ok to leave a voice mail.

## 2013-01-03 NOTE — Telephone Encounter (Signed)
Left message for patient that her AAA has gotten larger and Dr Lovena Le would like for her to see a vascular surgeon

## 2013-01-17 ENCOUNTER — Encounter: Payer: Self-pay | Admitting: Emergency Medicine

## 2013-01-17 ENCOUNTER — Ambulatory Visit: Payer: Medicare Other | Admitting: Emergency Medicine

## 2013-01-17 ENCOUNTER — Other Ambulatory Visit: Payer: Self-pay | Admitting: *Deleted

## 2013-01-17 VITALS — BP 122/78 | HR 68 | Temp 98.0°F | Resp 18 | Wt 176.0 lb

## 2013-01-17 DIAGNOSIS — Z01810 Encounter for preprocedural cardiovascular examination: Secondary | ICD-10-CM

## 2013-01-17 DIAGNOSIS — I4891 Unspecified atrial fibrillation: Secondary | ICD-10-CM

## 2013-01-17 DIAGNOSIS — J209 Acute bronchitis, unspecified: Secondary | ICD-10-CM

## 2013-01-17 DIAGNOSIS — N390 Urinary tract infection, site not specified: Secondary | ICD-10-CM

## 2013-01-17 DIAGNOSIS — I714 Abdominal aortic aneurysm, without rupture: Secondary | ICD-10-CM

## 2013-01-17 LAB — PROTIME-INR: Prothrombin Time: 15.4 seconds — ABNORMAL HIGH (ref 11.6–15.2)

## 2013-01-17 MED ORDER — AMOXICILLIN 875 MG PO TABS: 875.0000 mg | ORAL_TABLET | Freq: Two times a day (BID) | ORAL | Status: DC

## 2013-01-17 MED ORDER — BENZONATATE 100 MG PO CAPS: 100.0000 mg | ORAL_CAPSULE | Freq: Three times a day (TID) | ORAL | Status: DC | PRN

## 2013-01-17 NOTE — Patient Instructions (Signed)
Place atrial fibrillation/arrhythmia patient instructions here. Bronchitis Bronchitis is a problem of the air tubes leading to your lungs. This problem makes it hard for air to get in and out of the lungs. You may cough a lot because your air tubes are narrow. Going without care can cause lasting (chronic) bronchitis. HOME CARE   Drink enough fluids to keep your pee (urine) clear or pale yellow.  Use a cool mist humidifier.  Quit smoking if you smoke. If you keep smoking, the bronchitis might not get better.  Only take medicine as told by your doctor. GET HELP RIGHT AWAY IF:   Coughing keeps you awake.  You start to wheeze.  You become more sick or weak.  You have a hard time breathing or get short of breath.  You cough up blood.  Coughing lasts more than 2 weeks.  You have a fever.  Your baby is older than 3 months with a rectal temperature of 102 F (38.9 C) or higher.  Your baby is 37 months old or younger with a rectal temperature of 100.4 F (38 C) or higher. MAKE SURE YOU:  Understand these instructions.  Will watch your condition.  Will get help right away if you are not doing well or get worse. Document Released: 08/18/2007 Document Revised: 05/24/2011 Document Reviewed: 10/24/2012 [facility] Patient Information 2014 [place], [place]. Sinusitis Sinusitis is redness, soreness, and puffiness (inflammation) of the air pockets in the bones of your face (sinuses). The redness, soreness, and puffiness can cause air and mucus to get trapped in your sinuses. This can allow germs to grow and cause an infection.  HOME CARE   Drink enough fluids to keep your pee (urine) clear or pale yellow.  Use a humidifier in your home.  Run a hot shower to create steam in the bathroom. Sit in the bathroom with the door closed. Breathe in the steam 3 4 times a day.  Put a warm, moist washcloth on your face 3 4 times a day, or as told by your doctor.  Use salt water sprays (saline  sprays) to wet the thick fluid in your nose. This can help the sinuses drain.  Only take medicine as told by your doctor. GET HELP RIGHT AWAY IF:   Your pain gets worse.  You have very bad headaches.  You are sick to your stomach (nauseous).  You throw up (vomit).  You are very sleepy (drowsy) all the time.  Your face is puffy (swollen).  Your vision changes.  You have a stiff neck.  You have trouble breathing. MAKE SURE YOU:   Understand these instructions.  Will watch your condition.  Will get help right away if you are not doing well or get worse. Document Released: 08/18/2007 Document Revised: 11/24/2011 Document Reviewed: 10/05/2011 [facility] Patient Information 2014 [place]. Urinary Tract Infection A urinary tract infection (UTI) can occur any place along the urinary tract. The tract includes the kidneys, ureters, bladder, and urethra. A type of germ called bacteria often causes a UTI. UTIs are often helped with antibiotic medicine.  HOME CARE   If given, take antibiotics as told by your doctor. Finish them even if you start to feel better.  Drink enough fluids to keep your pee (urine) clear or pale yellow.  Avoid tea, drinks with caffeine, and bubbly (carbonated) drinks.  Pee often. Avoid holding your pee in for a long time.  Pee before and after having sex (intercourse).  Wipe from front to back after you  poop (bowel movement) if you are a woman. Use each tissue only once. GET HELP RIGHT AWAY IF:   You have back pain.  You have lower belly (abdominal) pain.  You have chills.  You feel sick to your stomach (nauseous).  You throw up (vomit).  Your burning or discomfort with peeing does not go away.  You have a fever.  Your symptoms are not better in 3 days. MAKE SURE YOU:   Understand these instructions.  Will watch your condition.  Will get help right away if you are not doing well or get worse. Document Released: 08/18/2007 Document  Revised: 11/24/2011 Document Reviewed: 09/30/2011 [facility] Patient Information 2014 [place], [place].

## 2013-01-18 ENCOUNTER — Telehealth: Payer: Self-pay | Admitting: *Deleted

## 2013-01-18 LAB — URINALYSIS, ROUTINE W REFLEX MICROSCOPIC
Bilirubin Urine: NEGATIVE
Glucose, UA: NEGATIVE mg/dL
Hgb urine dipstick: NEGATIVE
Protein, ur: NEGATIVE mg/dL
Urobilinogen, UA: 0.2 mg/dL (ref 0.0–1.0)

## 2013-01-18 LAB — URINALYSIS, MICROSCOPIC ONLY
Casts: NONE SEEN
Crystals: NONE SEEN

## 2013-01-18 LAB — DIGOXIN LEVEL: Digoxin Level: 1 ng/mL (ref 0.8–2.0)

## 2013-01-18 LAB — URINE CULTURE: Organism ID, Bacteria: NO GROWTH

## 2013-01-18 NOTE — Progress Notes (Signed)
Subjective:    Patient ID: Katie [name], female    DOB: 1940/08/23, 72 y.o.   MRN: [id number]  HPI Comments: 72 yo female presents for 1 month recheck of PT/INR for prophylaxis for AFIB, recent UTI and new complaint of bronchitis/ sinusitis. Admits to urine frequency increase since completing antibiotics, sinus pressure x 5 days with thick production and now with cough. Denies any brusing/ bleeding, CP, SOB. UTI treated with Macrobid at last ov.  Atrial Fibrillation Past medical history includes atrial fibrillation.     Current Outpatient Prescriptions on File Prior to Visit  Medication Sig Dispense Refill  . allopurinol (ZYLOPRIM) 300 MG tablet Take 300 mg by mouth every other day.       Marland Kitchen aspirin 81 MG tablet Take 81 mg by mouth daily.        . benazepril (LOTENSIN) 20 MG tablet Take 10 mg by mouth daily.       . bisoprolol-hydrochlorothiazide (ZIAC) 5-6.25 MG per tablet Take 1 tablet by mouth daily.       . digoxin (LANOXIN) 0.25 MG tablet Take 250 mcg by mouth daily.       . furosemide (LASIX) 80 MG tablet Take 40 mg by mouth daily. daily unless Blood Pressure is under 100      . gabapentin (NEURONTIN) 300 MG capsule Take 300 mg by mouth 3 (three) times daily.      Marland Kitchen levothyroxine (SYNTHROID, LEVOTHROID) 50 MCG tablet Take 50 mcg by mouth at bedtime.       . montelukast (SINGULAIR) 10 MG tablet Take 10 mg by mouth daily as needed. For allergy      . pantoprazole (PROTONIX) 40 MG tablet Take 1 tablet (40 mg total) by mouth daily.  30 tablet  0  . pravastatin (PRAVACHOL) 40 MG tablet Take 40 mg by mouth at bedtime.        . prochlorperazine (COMPAZINE) 5 MG tablet Take 5 mg by mouth every 6 (six) hours as needed. Dizzy      . roflumilast (DALIRESP) 500 MCG TABS tablet Take 500 mcg by mouth daily.      Marland Kitchen warfarin (COUMADIN) 2 MG tablet Take 2 mg by mouth daily.         No current facility-administered medications on file prior to visit.  ALLERGIES Advair diskus; Amiodarone; Codeine;  Diovan; Doxycycline; Flexeril; Keflex; and Verapamil Past Medical History  Diagnosis Date  . Atrial fibrillation   . COPD (chronic obstructive pulmonary disease)   . Bronchitis, chronic   . History of Bell's palsy   . Hypertension   . Coronary artery disease   . Migraine headache   . Atrial flutter   . Tobacco abuse   . Osteopenia   . Cervical dysplasia   . Thyroid disease     Hypo  . Elevated cholesterol   . Unspecified vitamin D deficiency     Review of Systems  Constitutional: Negative.   HENT: Positive for postnasal drip and sinus pressure.   Respiratory: Positive for cough.   Cardiovascular: Negative.   Gastrointestinal: Negative.   Endocrine: Negative.   Genitourinary: Positive for frequency.  Musculoskeletal: Negative.   Skin: Negative.   Neurological: Negative.   Hematological: Negative.   Psychiatric/Behavioral: Negative.       BP 122/78  Pulse 68  Temp(Src) 98 F (36.7 C) (Temporal)  Resp 18  Wt 176 lb (79.833 kg)  Objective:   Physical Exam  Nursing note and vitals reviewed. Constitutional: She is oriented to  person, place, and time. She appears well-developed and well-nourished.  HENT:  Head: Normocephalic and atraumatic.  Right Ear: External ear normal.  Left Ear: External ear normal.  Mouth/Throat: Oropharynx is clear and moist.  Bilateral mild maxillary sinus tenderness is present. Cloudy TM's bilaterally   Neck: Normal range of motion. Neck supple.  Cardiovascular: Normal rate, regular rhythm, normal heart sounds and intact distal pulses.   Pulmonary/Chest: Effort normal. She has wheezes.  Clears with cough, tight breath sounds  Abdominal: Soft. Bowel sounds are normal.  Musculoskeletal: Normal range of motion.  Neurological: She is alert and oriented to person, place, and time.  Skin: Skin is warm and dry.  Psychiatric: She has a normal mood and affect. Her behavior is normal. Thought content normal.          Assessment & Plan:   AFIB/ UTI 1 month recheck labs. ? SInusitis/ bronchitis/ Urine Frequency Amoxicillin 875 1 BID #14, Tessalon perles 100 mg 1-2 TID/PRN. Increase H2). SX Daliresp #21 AD. W/C if SX do not improve.

## 2013-01-18 NOTE — Telephone Encounter (Signed)
Message copied by Oneita Hurt on Thu Jan 18, 2013  5:36 PM ------      Message from: [place], [place] R      Created: Thu Jan 18, 2013  8:55 AM       C/S still pending but PT/INR not in range verify current dose with patient and document in med list and increase coumadin by 1/2 MWF ------

## 2013-01-19 NOTE — Telephone Encounter (Signed)
Message copied by Bradly Bienenstock A on Fri Jan 19, 2013 11:15 AM ------      Message from: [place], [place] R      Created: Fri Jan 19, 2013 10:54 AM       No UTI, Advise Patient ------

## 2013-02-05 ENCOUNTER — Other Ambulatory Visit: Payer: Self-pay | Admitting: Internal Medicine

## 2013-02-12 ENCOUNTER — Encounter: Payer: Self-pay | Admitting: Vascular Surgery

## 2013-02-13 ENCOUNTER — Other Ambulatory Visit (HOSPITAL_COMMUNITY): Payer: Medicare Other

## 2013-02-13 ENCOUNTER — Encounter: Payer: Self-pay | Admitting: Vascular Surgery

## 2013-02-13 ENCOUNTER — Ambulatory Visit (INDEPENDENT_AMBULATORY_CARE_PROVIDER_SITE_OTHER)
Admission: RE | Admit: 2013-02-13 | Discharge: 2013-02-13 | Disposition: A | Discharge status: Home / Self Care | Payer: Medicare Other | Source: Ambulatory Visit | Attending: Vascular Surgery | Admitting: Vascular Surgery

## 2013-02-13 ENCOUNTER — Ambulatory Visit (HOSPITAL_COMMUNITY)
Admission: RE | Admit: 2013-02-13 | Discharge: 2013-02-13 | Disposition: A | Discharge status: Home / Self Care | Payer: Medicare Other | Source: Ambulatory Visit | Attending: Vascular Surgery | Admitting: Vascular Surgery

## 2013-02-13 ENCOUNTER — Ambulatory Visit (INDEPENDENT_AMBULATORY_CARE_PROVIDER_SITE_OTHER): Payer: Medicare Other | Admitting: Vascular Surgery

## 2013-02-13 ENCOUNTER — Other Ambulatory Visit: Payer: Self-pay | Admitting: *Deleted

## 2013-02-13 VITALS — BP 124/77 | HR 86 | Resp 16 | Ht 64.0 in | Wt 175.0 lb

## 2013-02-13 DIAGNOSIS — Z01810 Encounter for preprocedural cardiovascular examination: Secondary | ICD-10-CM

## 2013-02-13 DIAGNOSIS — I714 Abdominal aortic aneurysm, without rupture, unspecified: Secondary | ICD-10-CM | POA: Insufficient documentation

## 2013-02-13 NOTE — Progress Notes (Signed)
Subjective:     Patient ID: Katie [name], female   DOB: [birth year], 72 y.o.   MRN: [id number]  HPI this 72 year old female was referred by Dr. Crissie Sickles for evaluation of abdominal aortic aneurysm. Patient has had a known aneurysm for the last 6 years which has grown from 3.6 cm on a CT scan in 2008 to current dimension of 5.1 x 5 cm. Patient denies any abdominal or back pain. She was admitted for chest pain about 6 months ago and this was thought to be due to her hiatal hernia with reflux. She had no evidence of myocardial infarction. 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 She is followed by Dr. Dorothea Ogle for chronic A. fib and is on Coumadin therapy.  Past Medical History  Diagnosis Date  . Atrial fibrillation   . COPD (chronic obstructive pulmonary disease)   . Bronchitis, chronic   . History of Bell's palsy   . Hypertension   . Coronary artery disease   . Migraine headache   . Atrial flutter   . Tobacco abuse   . Osteopenia   . Cervical dysplasia   . Thyroid disease     Hypo  . Elevated cholesterol   . Unspecified vitamin D deficiency     History  Substance Use Topics  . Smoking status: Former Smoker    Types: Cigarettes    Quit date: 08/10/2002  . Smokeless tobacco: Never Used     Comment: History of tobacco abuse  . Alcohol Use: 0.0 oz/week     Comment: rare    Family History  Problem Relation Age of Onset  . Cirrhosis Mother   . Cancer Mother 39    PANCREAS  . Heart defect Sister   . Breast cancer Sister     age 38  . Heart disease Sister   . Stroke Sister   . Alcohol abuse Father   . Depression Father   . Hypertension Brother   . Hyperlipidemia Son     Allergies  Allergen Reactions  . Advair Diskus [Fluticasone-Salmeterol] Other (See Comments)    SKIN CHANGES  . Amiodarone Other (See Comments)    PULMONARY TOXICITY  . Codeine     unknown  . Diovan  [Valsartan] Other (See Comments)    HYPOTENSION  . Doxycycline Other (See Comments)    VISUAL DISTURBANCE  . Flexeril [Cyclobenzaprine] Other (See Comments)    FATIGUE  . Keflex [Cephalexin] Diarrhea  . Verapamil Other (See Comments)    EDEMA    Current outpatient prescriptions:allopurinol (ZYLOPRIM) 300 MG tablet, TAKE 1 TABLET BY MOUTH DAILY, Disp: 90 tablet, Rfl: 1;  aspirin 81 MG tablet, Take 81 mg by mouth daily.  , Disp: , Rfl: ;  benazepril (LOTENSIN) 20 MG tablet, Take 10 mg by mouth daily. , Disp: , Rfl: ;  benzonatate (TESSALON) 100 MG capsule, Take 1 capsule (100 mg total) by mouth 3 (three) times daily as needed for cough., Disp: 20 capsule, Rfl: 0 bisoprolol-hydrochlorothiazide (ZIAC) 5-6.25 MG per tablet, Take 1 tablet by mouth daily. , Disp: , Rfl: ;  digoxin (LANOXIN) 0.25 MG tablet, Take 250 mcg by mouth daily. , Disp: , Rfl: ;  furosemide (LASIX) 80 MG tablet, Take 40 mg by mouth daily. daily unless Blood Pressure is under 100, Disp: , Rfl: ;  gabapentin (NEURONTIN) 300 MG capsule, TAKE ONE CAPSULE BY MOUTH 3  TIMES A DAY, Disp: 270 capsule, Rfl: 3 levothyroxine (SYNTHROID, LEVOTHROID) 50 MCG tablet, Take 50 mcg by mouth at bedtime. , Disp: , Rfl: ;  montelukast (SINGULAIR) 10 MG tablet, Take 10 mg by mouth daily as needed. For allergy, Disp: , Rfl: ;  pantoprazole (PROTONIX) 40 MG tablet, Take 1 tablet (40 mg total) by mouth daily., Disp: 30 tablet, Rfl: 0;  pravastatin (PRAVACHOL) 40 MG tablet, Take 40 mg by mouth at bedtime.  , Disp: , Rfl:  prochlorperazine (COMPAZINE) 5 MG tablet, Take 5 mg by mouth every 6 (six) hours as needed. Dizzy, Disp: , Rfl: ;  roflumilast (DALIRESP) 500 MCG TABS tablet, Take 500 mcg by mouth daily., Disp: , Rfl: ;  warfarin (COUMADIN) 2 MG tablet, Take 2 mg by mouth daily.  , Disp: , Rfl: ;  amoxicillin (AMOXIL) 875 MG tablet, Take 1 tablet (875 mg total) by mouth 2 (two) times daily., Disp: 14 tablet, Rfl: 0  BP 124/77  Pulse 86  Resp 16  Ht 5\' 4"   (1.626 m)  Wt 175 lb (79.379 kg)  BMI 30.02 kg/m2  Body mass index is 30.02 kg/(m^2).         Review of Systems denies active chest pain, mild dyspnea on exertion, leg pain while lying flat, denies hemoptysis, orthopnea, PND, claudication. Other systems negative and complete review of systems     Objective:   Physical Exam BP 124/77  Pulse 86  Resp 16  Ht 5\' 4"  (1.626 m)  Wt 175 lb (79.379 kg)  BMI 30.02 kg/m2  Gen.-alert and oriented x3 in no apparent distress HEENT normal for age Lungs no rhonchi or wheezing Cardiovascular regular rhythm no murmurs carotid pulses 3+ palpable no bruits audible Abdomen soft nontender no palpable masses Musculoskeletal free of  major deformities Skin clear -no rashes Neurologic normal Lower extremities 3+ femoral and dorsalis pedis pulses palpable bilaterally with no edema  Today I reviewed the ultrasound reports which revealed the mid infrarenal anomaly ordered to be 5.1 x 5.0 cm in maximum diameter in the proximal aorta to be 3.6 x 3.8 cm. Also ordered ABIs and duplex scan of her popliteal arteries which revealed no evidence of aneurysmal occlusive disease in the popliteal arteries. ABIs are normal bilaterally with triphasic flow.     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 Lovena Le    Plan:     Discussed situation with Dr. Lovena Le Will obtain CT angiogram of abdomen and pelvis to determine if stent grafting is a reasonable option If patient needs open repair with aortobifemoral bypass will refer back to  Dr. Lovena Le for possible cardiac cath or updated cardiac evaluation The patient can be done with stent graft he does not feel further evaluation is indicated Return to see me in 2-3 weeks

## 2013-02-15 DIAGNOSIS — E559 Vitamin D deficiency, unspecified: Secondary | ICD-10-CM | POA: Insufficient documentation

## 2013-02-15 NOTE — Patient Instructions (Signed)
Continue diet & medications same as discussed.   Further disposition pending lab results.     Hypertension As your heart beats, it forces blood through your arteries. This force is your blood pressure. If the pressure is too high, it is called hypertension (HTN) or high blood pressure. HTN is dangerous because you may have it and not know it. High blood pressure may mean that your heart has to work harder to pump blood. Your arteries may be narrow or stiff. The extra work puts you at risk for heart disease, stroke, and other problems.  Blood pressure consists of two numbers, a higher number over a lower, 110/72, for example. It is stated as "110 over 72." The ideal is below 120 for the top number (systolic) and under 80 for the bottom (diastolic). Write down your blood pressure today. You should pay close attention to your blood pressure if you have certain conditions such as:  Heart failure.  Prior heart attack.  Diabetes  Chronic kidney disease.  Prior stroke.  Multiple risk factors for heart disease. To see if you have HTN, your blood pressure should be measured while you are seated with your arm held at the level of the heart. It should be measured at least twice. A one-time elevated blood pressure reading (especially in the Emergency Department) does not mean that you need treatment. There may be conditions in which the blood pressure is different between your right and left arms. It is important to see your caregiver soon for a recheck. Most people have essential hypertension which means that there is not a specific cause. This type of high blood pressure may be lowered by changing lifestyle factors such as:  Stress.  Smoking.  Lack of exercise.  Excessive weight.  Drug/tobacco/alcohol use.  Eating less salt. Most people do not have symptoms from high blood pressure until it has caused damage to the body. Effective treatment can often prevent, delay or reduce that  damage. TREATMENT  When a cause has been identified, treatment for high blood pressure is directed at the cause. There are a large number of medications to treat HTN. These fall into several categories, and your caregiver will help you select the medicines that are best for you. Medications may have side effects. You should review side effects with your caregiver. If your blood pressure stays high after you have made lifestyle changes or started on medicines,   Your medication(s) may need to be changed.  Other problems may need to be addressed.  Be certain you understand your prescriptions, and know how and when to take your medicine.  Be sure to follow up with your caregiver within the time frame advised (usually within two weeks) to have your blood pressure rechecked and to review your medications.  If you are taking more than one medicine to lower your blood pressure, make sure you know how and at what times they should be taken. Taking two medicines at the same time can result in blood pressure that is too low. SEEK IMMEDIATE MEDICAL CARE IF:  You develop a severe headache, blurred or changing vision, or confusion.  You have unusual weakness or numbness, or a faint feeling.  You have severe chest or abdominal pain, vomiting, or breathing problems. MAKE SURE YOU:   Understand these instructions.  Will watch your condition.  Will get help right away if you are not doing well or get worse. Document Released: 03/01/2005 Document Revised: 05/24/2011 Document Reviewed: 10/20/2007 [employer] Patient Information 2014  [employer]. Cholesterol Cholesterol is a white, waxy, fat-like protein needed by your body in small amounts. The liver makes all the cholesterol you need. It is carried from the liver by the blood through the blood vessels. Deposits (plaque) may build up on blood vessel walls. This makes the arteries narrower and stiffer. Plaque increases the risk for heart attack and  stroke. You cannot feel your cholesterol level even if it is very high. The only way to know is by a blood test to check your lipid (fats) levels. Once you know your cholesterol levels, you should keep a record of the test results. Work with your caregiver to to keep your levels in the desired range. WHAT THE RESULTS MEAN:  Total cholesterol is a rough measure of all the cholesterol in your blood.  LDL is the so-called bad cholesterol. This is the type that deposits cholesterol in the walls of the arteries. You want this level to be low.  HDL is the good cholesterol because it cleans the arteries and carries the LDL away. You want this level to be high.  Triglycerides are fat that the body can either burn for energy or store. High levels are closely linked to heart disease. DESIRED LEVELS:  Total cholesterol below 200.  LDL below 100 for people at risk, below 70 for very high risk.  HDL above 50 is good, above 60 is best.  Triglycerides below 150. HOW TO LOWER YOUR CHOLESTEROL:  Diet.  Choose fish or white meat chicken and [place], roasted or baked. Limit fatty cuts of red meat, fried foods, and processed meats, such as sausage and lunch meat.  Eat lots of fresh fruits and vegetables. Choose whole grains, beans, pasta, potatoes and cereals.  Use only small amounts of olive, corn or canola oils. Avoid butter, mayonnaise, shortening or palm kernel oils. Avoid foods with trans-fats.  Use skim/nonfat milk and low-fat/nonfat yogurt and cheeses. Avoid whole milk, cream, ice cream, egg yolks and cheeses. Healthy desserts include angel food cake, ginger snaps, animal crackers, hard candy, popsicles, and low-fat/nonfat frozen yogurt. Avoid pastries, cakes, pies and cookies.  Exercise.  A regular program helps decrease LDL and raises HDL.  Helps with weight control.  Do things that increase your activity level like gardening, walking, or taking the stairs.  Medication.  May be  prescribed by your caregiver to help lowering cholesterol and the risk for heart disease.  You may need medicine even if your levels are normal if you have several risk factors. HOME CARE INSTRUCTIONS   Follow your diet and exercise programs as suggested by your caregiver.  Take medications as directed.  Have blood work done when your caregiver feels it is necessary. MAKE SURE YOU:   Understand these instructions.  Will watch your condition.  Will get help right away if you are not doing well or get worse. Document Released: 11/24/2000 Document Revised: 05/24/2011 Document Reviewed: 05/17/2007 [facility] Patient Information 2014 [place], [place]. Insulin Resistance Blood sugar (glucose) levels are controlled by a hormone called insulin. Insulin is made by your pancreas. When your blood glucose goes up, insulin is released into your blood. Insulin is required for your body to function normally. However, your body can become resistant to your own insulin or to insulin given to treat diabetes. In either case, insulin resistance can lead to serious problems. These problems include:  Type 2 diabetes.  Heart disease.  High blood pressure.  Stroke.  Polycystic ovary syndrome.  Fatty liver. CAUSES  Insulin  resistance can develop for many different reasons. It is more likely to happen in people with these conditions or characteristics:  Obesity.  Inactivity.  Pregnancy.  High blood pressure.  Stress.  Steroid use.  Infection or severe illness.  Increased levels of cholesterol and triglycerides. SYMPTOMS  There are no symptoms. You may have symptoms related to the various complications of insulin resistance.  DIAGNOSIS  Several different things can make your caregiver suspect you have insulin resistance. These include:  High blood glucose (hyperglycemia).  Abnormal cholesterol levels.  High uric acid levels.  Changes related to blood pressure.  Changes related to  inflammation. Insulin resistance can be determined with blood tests. An elevated insulin level when you have not eaten might suggest resistance. Other more complicated tests are sometimes necessary. TREATMENT  Lifestyle changes are the most important treatment for insulin resistance.   If you are overweight and you have insulin resistance, you can improve your insulin sensitivity by losing weight.  Moderate exercise for 30 40 minutes, 4 days a week, can improve insulin sensitivity. Some medicines can also help improve your insulin sensitivity. Your caregiver can discuss these with you if they are appropriate.  HOME CARE INSTRUCTIONS   Do not smoke.  Keep your weight at a healthy level.  Get exercise.  If you have diabetes, follow your caregiver's directions.  If you have high blood pressure, follow your caregiver's directions.  Only take prescription medicines for pain, fever, or discomfort as directed by your caregiver. SEEK MEDICAL CARE IF:   You are diabetic and you are having problems keeping your blood glucose levels at target range.  You are having episodes of low blood glucose (hypoglycemia).  You feel you might be having side effects from your medicines.  You have symptoms of an illness that is not improving after 3 4 days.  You have a sore or wound that is not healing.  You notice a change in vision or a new problem with your vision. SEEK IMMEDIATE MEDICAL CARE IF:   Your blood glucose goes below 70, especially if you have confusion, lightheadedness, or other symptoms with it.  Your blood glucose is very high (as advised by your caregiver) twice in a row.  You pass out.  You have chest pain or trouble breathing.  You have a sudden, severe headache.  You have sudden weakness in one arm or one leg.  You have sudden difficulty speaking or swallowing.  You develop vomiting or diarrhea that is getting worse or not improving after 1 day. Document Released:  04/20/2005 Document Revised: 08/31/2011 Document Reviewed: 08/10/2012 [facility] Patient Information 2014 [place], [place]. Vitamin D Deficiency Vitamin D is an important vitamin that your body needs. Having too little of it in your body is called a deficiency. A very bad deficiency can make your bones soft and can cause a condition called rickets.  Vitamin D is important to your body for different reasons, such as:   It helps your body absorb 2 minerals called calcium and phosphorus.  It helps make your bones healthy.  It may prevent some diseases, such as diabetes and multiple sclerosis.  It helps your muscles and heart. You can get vitamin D in several ways. It is a natural part of some foods. The vitamin is also added to some dairy products and cereals. Some people take vitamin D supplements. Also, your body makes vitamin D when you are in the sun. It changes the sun's rays into a form of the vitamin  that your body can use. CAUSES   Not eating enough foods that contain vitamin D.  Not getting enough sunlight.  Having certain digestive system diseases that make it hard to absorb vitamin D. These diseases include Crohn's disease, chronic pancreatitis, and cystic fibrosis.  Having a surgery in which part of the stomach or small intestine is removed.  Being obese. Fat cells pull vitamin D out of your blood. That means that obese people may not have enough vitamin D left in their blood and in other body tissues.  Having chronic kidney or liver disease. RISK FACTORS Risk factors are things that make you more likely to develop a vitamin D deficiency. They include:  Being older.  Not being able to get outside very much.  Living in a nursing home.  Having had broken bones.  Having weak or thin bones (osteoporosis).  Having a disease or condition that changes how your body absorbs vitamin D.  Having dark skin.  Some medicines such as seizure medicines or steroids.  Being  overweight or obese. SYMPTOMS Mild cases of vitamin D deficiency may not have any symptoms. If you have a very bad case, symptoms may include:  Bone pain.  Muscle pain.  Falling often.  Broken bones caused by a minor injury, due to osteoporosis. DIAGNOSIS A blood test is the best way to tell if you have a vitamin D deficiency. TREATMENT Vitamin D deficiency can be treated in different ways. Treatment for vitamin D deficiency depends on what is causing it. Options include:  Taking vitamin D supplements.  Taking a calcium supplement. Your caregiver will suggest what dose is best for you. HOME CARE INSTRUCTIONS  Take any supplements that your caregiver prescribes. Follow the directions carefully. Take only the suggested amount.  Have your blood tested 2 months after you start taking supplements.  Eat foods that contain vitamin D. Healthy choices include:  Fortified dairy products, cereals, or juices. Fortified means vitamin D has been added to the food. Check the label on the package to be sure.  Fatty fish like salmon or trout.  Eggs.  Oysters.  Do not use a tanning bed.  Keep your weight at a healthy level. Lose weight if you need to.  Keep all follow-up appointments. Your caregiver will need to perform blood tests to make sure your vitamin D deficiency is going away. SEEK MEDICAL CARE IF:  You have any questions about your treatment.  You continue to have symptoms of vitamin D deficiency.  You have nausea or vomiting.  You are constipated.  You feel confused.  You have severe abdominal or back pain. MAKE SURE YOU:  Understand these instructions.  Will watch your condition.  Will get help right away if you are not doing well or get worse. Document Released: 05/24/2011 Document Revised: 06/26/2012 Document Reviewed: 05/24/2011 [facility] Patient Information 2014 [place].

## 2013-02-15 NOTE — Progress Notes (Signed)
Patient ID: Katie [name], female   DOB: [birth year], 72 y.o.   MRN: [id number]   This very nice 72 yo MWF presents for 3 month follow up with Hypertension, COPD, Chronic A.Fib.- on long term Warfarin, Hyperlipidemia, Pre-Diabetes and Vitamin D Deficiency. Patient has an enlarging AAA and was recently advised surgical repair by Dr Kellie Simmering in the near future.   BP has been controlled at home. Today's BP is 130/84. Patient denies any cardiac type chest pain, palpitations, dyspnea/orthopnea/PND, dizziness, claudication, or dependent edema. Patient's A.Fib is controlled and stable and coag.s are monitored monthly.   Hyperlipidemia is controlled with diet. Last Cholesterol was 157, Triglycerides were 137, HDL 30 and LDL 100 in August - at goal.    Also, the patient has history of PreDiabetes/insulin resistance with last A1c of 5.7% and elevated insulin 39 (nl<28). Patient denies any symptoms of reactive hypoglycemia, diabetic polys, paresthesias or visual blurring.   Further, Patient has history of Vitamin D Deficiency with last vitamin D of 50 was 32 in 2008). Patient supplements vitamin D without any suspected side-effects.  Current Outpatient Prescriptions on File Prior to Visit  Medication Sig Dispense Refill  . allopurinol (ZYLOPRIM) 300 MG tablet TAKE 1 TABLET BY MOUTH DAILY  90 tablet  1  . aspirin 81 MG tablet Take 81 mg by mouth daily.        . benazepril (LOTENSIN) 20 MG tablet Take 10 mg by mouth daily.       . bisoprolol-hydrochlorothiazide (ZIAC) 5-6.25 MG per tablet Take 1 tablet by mouth daily.       . digoxin (LANOXIN) 0.25 MG tablet Take 250 mcg by mouth daily.       . furosemide (LASIX) 80 MG tablet Take 40 mg by mouth daily. daily unless Blood Pressure is under 100      . gabapentin (NEURONTIN) 300 MG capsule TAKE ONE CAPSULE BY MOUTH 3 TIMES A DAY  270 capsule  3  . Levo-thyroxine Take 50 mcg by mouth at bedtime.       . montelukast (SINGULAIR) 10 MG tablet Take 10 mg by mouth daily  as needed. For allergy      . pantoprazole (PROTONIX) 40 MG tablet Take 1 tablet (40 mg total) by mouth daily. prn   . prochlorperazine (COMPAZINE) 5 MG tablet Take 5 mg by mouth every 6 (six) hours as needed. Dizzy      . roflumilast (DALIRESP) 500 MCG TABS tablet Take 500 mcg by mouth daily.      Marland Kitchen warfarin (COUMADIN) 2 MG tablet  1 tab = 2 mg x 2 da/wk and 1&1/2 tab = 3mg  x 5 days /wk         Allergies  Allergen Reactions  . Advair Diskus [Fluticasone-Salmeterol] Other (See Comments)    SKIN CHANGES  . Amiodarone Other (See Comments)    PULMONARY TOXICITY  . Codeine     unknown  . Diovan [Valsartan] Other (See Comments)    HYPOTENSION  . Doxycycline Other (See Comments)    VISUAL DISTURBANCE  . Flexeril [Cyclobenzaprine] Other (See Comments)    FATIGUE  . Keflex [Cephalexin] Diarrhea  . Verapamil Other (See Comments)    EDEMA    PMHx:   Past Medical History  Diagnosis Date  . Atrial fibrillation   . COPD (chronic obstructive pulmonary disease)   . Bronchitis, chronic   . History of Bell's palsy   . Hypertension   . Coronary artery disease   . Migraine headache   .  Atrial flutter   . Tobacco abuse   . Osteopenia   . Cervical dysplasia   . Thyroid disease     Hypo  . Elevated cholesterol   . Unspecified vitamin D deficiency     FHx:    Reviewed / unchanged  SHx:    Reviewed / unchanged  Systems Review: Constitutional: Denies fever, chills, wt changes, headaches, insomnia, fatigue, night sweats, change in appetite. Eyes: Denies redness, blurred vision, diplopia, discharge, itchy, watery eyes.  ENT: Denies discharge, congestion, post nasal drip, epistaxis, sore throat, earache, hearing loss, dental pain, tinnitus, vertigo, sinus pain, snoring.  CV: Denies chest pain, palpitations, irregular heartbeat, syncope, dyspnea, diaphoresis, orthopnea, PND, claudication, edema. Respiratory: denies cough, dyspnea, DOE, pleurisy, hoarseness, laryngitis, wheezing.   Gastrointestinal: Denies dysphagia, odynophagia, heartburn, reflux, water brash, abdominal pain or cramps, nausea, vomiting, bloating, diarrhea, constipation, hematemesis, melena, hematochezia,  or hemorrhoids. Genitourinary: Denies dysuria, frequency, urgency, nocturia, hesitancy, discharge, hematuria, flank pain. Musculoskeletal: Denies arthralgias, myalgias, stiffness, jt. swelling, pain, limp, strain/sprain.  Skin: Denies pruritus, rash, hives, warts, acne, eczema, change in skin lesion(s). Neuro: No weakness, tremor, incoordination, spasms, paresthesia, or pain. Psychiatric: Denies confusion, memory loss, or sensory loss. Endo: Denies change in weight, skin, hair change.  Heme/Lymph: No excessive bleeding, bruising, orenlarged lymph nodes.  Filed Vitals:   02/16/13 0939  BP: 130/84  Pulse: 88  Temp: 97.7 F (36.5 C)  Resp: 18    Estimated body mass index is 30.74 kg/(m^2) as calculated from the following:   Height as of 02/13/13: 5\' 4"  (1.626 m).   Weight as of this encounter: 179 lb 3.2 oz (81.285 kg).  On Exam: Appears well nourished - in no distress. Eyes: PERRLA, EOMs, conjunctiva no swelling or erythema. Sinuses: No frontal/maxillary tenderness ENT/Mouth: EAC's clear, TM's nl w/o erythema, bulging. Nares clear w/o erythema, swelling, exudates. Oropharynx clear without erythema or exudates. Oral hygiene is good. Tongue normal, non obstructing. Hearing intact.  Neck: Supple. Thyroid nl. Car 2+/2+ without bruits, nodes or JVD. Chest: Respirations nl with BS clear & equal w/o rales, rhonchi, wheezing or stridor.  Cor: Heart sounds normal w/ irregular rate and rhythm- a.fib pattern without sig. murmurs, gallops, clicks, or rubs. Peripheral pulses normal and equal  without edema.  Abdomen: Soft & bowel sounds normal. Non-tender w/o guarding, rebound, hernias, masses, or organomegaly.  Lymphatics: Unremarkable.  Musculoskeletal: Full ROM all peripheral extremities, joint  stability, 5/5 strength, and normal gait.  Skin: Warm, dry without exposed rashes, lesions, ecchymosis apparent.  Neuro: Cranial nerves intact, reflexes equal bilaterally. Sensory-motor testing grossly intact. Tendon reflexes grossly intact.  Pysch: Alert & oriented x 3. Insight and judgement nl & appropriate. No ideations.  Assessment and Plan:  1. Hypertension - Continue monitor blood pressure at home. Continue diet/meds same.  2. Hyperlipidemia - Continue diet/exercise,& lifestyle modifications. Continue monitor periodic cholesterol/liver & renal functions   3. Pre-diabetes/Insulin Resistance - Continue diet, exercise, lifestyle modifications. Monitor appropriate labs.  4. Vitamin D Deficiency - Continue supplementation.  5. Chronic Atrial Fibrillation - continue Warfarin and coag monitoring  6. AAA - as per Dr Kellie Simmering   Recommended regular exercise, BP monitoring, weight control, and discussed med and SE's. Recommended labs to assess and monitor clinical status. Further disposition pending results of labs.

## 2013-02-16 ENCOUNTER — Ambulatory Visit (INDEPENDENT_AMBULATORY_CARE_PROVIDER_SITE_OTHER): Payer: Medicare Other | Admitting: Internal Medicine

## 2013-02-16 ENCOUNTER — Ambulatory Visit (HOSPITAL_COMMUNITY)
Admission: RE | Admit: 2013-02-16 | Discharge: 2013-02-16 | Disposition: A | Discharge status: Home / Self Care | Payer: Medicare Other | Source: Ambulatory Visit | Attending: Internal Medicine | Admitting: Internal Medicine

## 2013-02-16 ENCOUNTER — Encounter: Payer: Self-pay | Admitting: Internal Medicine

## 2013-02-16 ENCOUNTER — Telehealth: Payer: Self-pay | Admitting: Internal Medicine

## 2013-02-16 VITALS — BP 130/84 | HR 88 | Temp 97.7°F | Resp 18 | Wt 179.2 lb

## 2013-02-16 DIAGNOSIS — Z7901 Long term (current) use of anticoagulants: Secondary | ICD-10-CM

## 2013-02-16 DIAGNOSIS — F17200 Nicotine dependence, unspecified, uncomplicated: Secondary | ICD-10-CM | POA: Insufficient documentation

## 2013-02-16 DIAGNOSIS — I10 Essential (primary) hypertension: Secondary | ICD-10-CM

## 2013-02-16 DIAGNOSIS — I4891 Unspecified atrial fibrillation: Secondary | ICD-10-CM | POA: Insufficient documentation

## 2013-02-16 DIAGNOSIS — Z79899 Other long term (current) drug therapy: Secondary | ICD-10-CM

## 2013-02-16 DIAGNOSIS — R7309 Other abnormal glucose: Secondary | ICD-10-CM

## 2013-02-16 DIAGNOSIS — E7800 Pure hypercholesterolemia, unspecified: Secondary | ICD-10-CM

## 2013-02-16 DIAGNOSIS — E559 Vitamin D deficiency, unspecified: Secondary | ICD-10-CM

## 2013-02-16 DIAGNOSIS — J40 Bronchitis, not specified as acute or chronic: Secondary | ICD-10-CM | POA: Insufficient documentation

## 2013-02-16 DIAGNOSIS — I517 Cardiomegaly: Secondary | ICD-10-CM | POA: Insufficient documentation

## 2013-02-16 LAB — HEPATIC FUNCTION PANEL
AST: 18 U/L (ref 0–37)
Albumin: 4.1 g/dL (ref 3.5–5.2)
Alkaline Phosphatase: 38 U/L — ABNORMAL LOW (ref 39–117)
Total Bilirubin: 0.6 mg/dL (ref 0.3–1.2)
Total Protein: 6.7 g/dL (ref 6.0–8.3)

## 2013-02-16 LAB — LIPID PANEL
Cholesterol: 168 mg/dL (ref 0–200)
HDL: 36 mg/dL — ABNORMAL LOW (ref >39)
LDL Cholesterol: 101 mg/dL — ABNORMAL HIGH (ref 0–99)
Total CHOL/HDL Ratio: 4.7 Ratio
VLDL: 31 mg/dL (ref 0–40)

## 2013-02-16 LAB — CBC WITH DIFFERENTIAL/PLATELET
Basophils Absolute: 0 10*3/uL (ref 0.0–0.1)
Basophils Relative: 0 % (ref 0–1)
Eosinophils Relative: 4 % (ref 0–5)
Lymphocytes Relative: 26 % (ref 12–46)
MCHC: 35.6 g/dL (ref 30.0–36.0)
MCV: 85.1 fL (ref 78.0–100.0)
Neutro Abs: 4.5 10*3/uL (ref 1.7–7.7)
Platelets: 256 10*3/uL (ref 150–400)
RDW: 13.9 % (ref 11.5–15.5)
WBC: 7 10*3/uL (ref 4.0–10.5)

## 2013-02-16 LAB — PROTIME-INR: Prothrombin Time: 27.7 seconds — ABNORMAL HIGH (ref 11.6–15.2)

## 2013-02-16 LAB — BASIC METABOLIC PANEL WITH GFR
BUN: 18 mg/dL (ref 6–23)
Calcium: 9.4 mg/dL (ref 8.4–10.5)
Chloride: 94 mEq/L — ABNORMAL LOW (ref 96–112)
Creat: 1.31 mg/dL — ABNORMAL HIGH (ref 0.50–1.10)
GFR, Est African American: 47 mL/min — ABNORMAL LOW
GFR, Est Non African American: 41 mL/min — ABNORMAL LOW
Glucose, Bld: 119 mg/dL — ABNORMAL HIGH (ref 70–99)
Potassium: 4.1 mEq/L (ref 3.5–5.3)

## 2013-02-16 LAB — MAGNESIUM: Magnesium: 1.7 mg/dL (ref 1.5–2.5)

## 2013-02-16 LAB — HEMOGLOBIN A1C: Hgb A1c MFr Bld: 5.7 % — ABNORMAL HIGH (ref <5.7)

## 2013-02-16 LAB — TSH: TSH: 3.659 u[IU]/mL (ref 0.350–4.500)

## 2013-02-17 LAB — DIGOXIN LEVEL: Digoxin Level: 0.8 ng/mL (ref 0.8–2.0)

## 2013-02-20 ENCOUNTER — Ambulatory Visit: Payer: Medicare Other | Admitting: Vascular Surgery

## 2013-02-21 NOTE — Telephone Encounter (Signed)
Spoke with pt and gave lab results and CRX result

## 2013-02-28 ENCOUNTER — Encounter: Payer: Self-pay | Admitting: Emergency Medicine

## 2013-02-28 ENCOUNTER — Ambulatory Visit (INDEPENDENT_AMBULATORY_CARE_PROVIDER_SITE_OTHER): Payer: Medicare Other | Admitting: Emergency Medicine

## 2013-02-28 VITALS — BP 118/64 | HR 64 | Temp 98.2°F | Resp 18 | Ht 63.25 in | Wt 176.0 lb

## 2013-02-28 DIAGNOSIS — D239 Other benign neoplasm of skin, unspecified: Secondary | ICD-10-CM

## 2013-02-28 DIAGNOSIS — D229 Melanocytic nevi, unspecified: Secondary | ICD-10-CM

## 2013-02-28 DIAGNOSIS — I10 Essential (primary) hypertension: Secondary | ICD-10-CM

## 2013-02-28 NOTE — Patient Instructions (Signed)
Wound Care Wound care helps prevent pain and infection.  You may need a tetanus shot if:  You cannot remember when you had your last tetanus shot.  You have never had a tetanus shot.  The injury broke your skin. If you need a tetanus shot and you choose not to have one, you may get tetanus. Sickness from tetanus can be serious. HOME CARE   Only take medicine as told by your doctor.  Clean the wound daily with mild soap and water.  Change any bandages (dressings) as told by your doctor.  Put medicated cream and a bandage on the wound as told by your doctor.  Change the bandage if it gets wet, dirty, or starts to smell.  Take showers. Do not take baths, swim, or do anything that puts your wound under water.  Rest and raise (elevate) the wound until the pain and puffiness (swelling) are better.  Keep all doctor visits as told. GET HELP RIGHT AWAY IF:   Yellowish-white fluid (pus) comes from the wound.  Medicine does not lessen your pain.  There is a red streak going away from the wound.  You have a fever. MAKE SURE YOU:   Understand these instructions.  Will watch your condition.  Will get help right away if you are not doing well or get worse. Document Released: 12/09/2007 Document Revised: 05/24/2011 Document Reviewed: 07/05/2010 [employer] Patient Information 2014 [employer].  

## 2013-03-05 NOTE — Progress Notes (Signed)
Subjective:    Patient ID: Katie [name], female    DOB: [birth year], 72 y.o.   MRN: [id number]  HPI Comments: 72 yo female presents with HTN and chronic COPD needing to have moles evaluated. She notes her breathing is much better with Daliresp but cannot afford RX and would like SX. She notes BP has been good at home. She is concerned because she has several moles that have grown, itch and  Are irritated from her clothing and would like to have then removed.   Current Outpatient Prescriptions on File Prior to Visit  Medication Sig Dispense Refill  . allopurinol (ZYLOPRIM) 300 MG tablet TAKE 1 TABLET BY MOUTH DAILY  90 tablet  1  . aspirin 81 MG tablet Take 81 mg by mouth daily.        . benazepril (LOTENSIN) 20 MG tablet Take 10 mg by mouth daily.       . benzonatate (TESSALON) 100 MG capsule Take 1 capsule (100 mg total) by mouth 3 (three) times daily as needed for cough.  20 capsule  0  . bisoprolol-hydrochlorothiazide (ZIAC) 5-6.25 MG per tablet Take 1 tablet by mouth daily.       . digoxin (LANOXIN) 0.25 MG tablet Take 250 mcg by mouth daily.       . furosemide (LASIX) 80 MG tablet Take 40 mg by mouth daily. daily unless Blood Pressure is under 100      . gabapentin (NEURONTIN) 300 MG capsule TAKE ONE CAPSULE BY MOUTH 3 TIMES A DAY  270 capsule  3  . levothyroxine (SYNTHROID, LEVOTHROID) 50 MCG tablet Take 50 mcg by mouth at bedtime.       . montelukast (SINGULAIR) 10 MG tablet Take 10 mg by mouth daily as needed. For allergy      . pantoprazole (PROTONIX) 40 MG tablet Take 1 tablet (40 mg total) by mouth daily.  30 tablet  0  . pravastatin (PRAVACHOL) 40 MG tablet Take 40 mg by mouth at bedtime.        . prochlorperazine (COMPAZINE) 5 MG tablet Take 5 mg by mouth every 6 (six) hours as needed. Dizzy      . roflumilast (DALIRESP) 500 MCG TABS tablet Take 500 mcg by mouth daily.      Marland Kitchen warfarin (COUMADIN) 2 MG tablet Take 2 mg by mouth daily.         No current facility-administered  medications on file prior to visit.   ALLERGIES Advair diskus; Amiodarone; Codeine; Diovan; Doxycycline; Flexeril; Keflex; and Verapamil  Past Medical History  Diagnosis Date  . Atrial fibrillation   . COPD (chronic obstructive pulmonary disease)   . Bronchitis, chronic   . History of Bell's palsy   . Hypertension   . Coronary artery disease   . Migraine headache   . Atrial flutter   . Tobacco abuse   . Osteopenia   . Cervical dysplasia   . Thyroid disease     Hypo  . Elevated cholesterol   . Unspecified vitamin D deficiency      Review of Systems  Skin:       Left Upper Anterior lateral chest 4-5 mm Bra line Mid back 4 mm Right Upper Midline back 4-5 mm   All other systems reviewed and are negative.   BP 118/64  Pulse 64  Temp(Src) 98.2 F (36.8 C) (Temporal)  Resp 18  Ht 5' 3.25" (1.607 m)  Wt 176 lb (79.833 kg)  BMI 30.91 kg/m2  Objective:   Physical Exam  Nursing note and vitals reviewed. Constitutional: She is oriented to person, place, and time. She appears well-developed and well-nourished.  HENT:  Head: Normocephalic and atraumatic.  Right Ear: External ear normal.  Left Ear: External ear normal.  Nose: Nose normal.  Mouth/Throat: Oropharynx is clear and moist.  Eyes: Conjunctivae and EOM are normal.  Neck: Normal range of motion.  Cardiovascular: Normal rate, regular rhythm, normal heart sounds and intact distal pulses.   Pulmonary/Chest: Effort normal and breath sounds normal.  Musculoskeletal: Normal range of motion.  Lymphadenopathy:    She has no cervical adenopathy.  Neurological: She is alert and oriented to person, place, and time.  Skin: Skin is warm and dry.  Left Upper Anterior lateral chest 4-5 mm Bra line Mid back 4 mm Right Upper Midline back 4-5 mm  All areas elevated scaling/ irregular with mild erythema seborrheic keratosis appearing   Psychiatric: She has a normal mood and affect. Judgment normal.           Assessment & Plan:  1. CHRONIC Copd- requesting SX, w/c when available of Daliresp 2. HTN- Check BP if above 130/80 call 3. Irreg/ Atypical nevi vs seborrheic Keratosis-verbal permission given. Removal of areas performed with Cryosurgery to each area, wound hygiene discussed, w/c with any concerns.

## 2013-03-12 ENCOUNTER — Encounter: Payer: Self-pay | Admitting: Vascular Surgery

## 2013-03-13 ENCOUNTER — Encounter: Payer: Self-pay | Admitting: Vascular Surgery

## 2013-03-13 ENCOUNTER — Ambulatory Visit
Admission: RE | Admit: 2013-03-13 | Discharge: 2013-03-13 | Disposition: A | Discharge status: Home / Self Care | Payer: Medicare Other | Source: Ambulatory Visit | Attending: Vascular Surgery | Admitting: Vascular Surgery

## 2013-03-13 ENCOUNTER — Ambulatory Visit (INDEPENDENT_AMBULATORY_CARE_PROVIDER_SITE_OTHER): Payer: Medicare Other | Admitting: Vascular Surgery

## 2013-03-13 VITALS — BP 107/61 | HR 84 | Resp 16 | Ht 64.0 in | Wt 170.0 lb

## 2013-03-13 DIAGNOSIS — I714 Abdominal aortic aneurysm, without rupture, unspecified: Secondary | ICD-10-CM

## 2013-03-13 MED ORDER — IOHEXOL 350 MG/ML SOLN
80.0000 mL | Freq: Once | INTRAVENOUS | Status: AC | PRN
  Administered 2013-03-13: 80 mL via INTRAVENOUS

## 2013-03-13 NOTE — Progress Notes (Signed)
Subjective:     Patient ID: Katie [name], female   DOB: 23-Nov-1940, 72 y.o.   MRN: [id number]  HPI this 71 year old female returns today to further discuss treatment for her abdominal aortic aneurysm which measures 5.1 x 5.0 cm and has been followed for several years. Today she had a CT angiogram of the abdomen and pelvis which I have reviewed by computer. Patient does have what appears to be a satisfactory neck distal to the renal arteries for a seal to allow insertion of an aortic stent graft. 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 She is currently asymptomatic. She's followed by Dr. Crissie Sickles who feels that she is not need further cardiac evaluation for insertion of the stent graft.  Past Medical History  Diagnosis Date  . Atrial fibrillation   . COPD (chronic obstructive pulmonary disease)   . Bronchitis, chronic   . History of Bell's palsy   . Hypertension   . Coronary artery disease   . Migraine headache   . Atrial flutter   . Tobacco abuse   . Osteopenia   . Cervical dysplasia   . Thyroid disease     Hypo  . Elevated cholesterol   . Unspecified vitamin D deficiency     History  Substance Use Topics  . Smoking status: Former Smoker    Types: Cigarettes    Quit date: 08/10/2002  . Smokeless tobacco: Never Used     Comment: History of tobacco abuse  . Alcohol Use: 0.0 oz/week     Comment: rare    Family History  Problem Relation Age of Onset  . Cirrhosis Mother   . Cancer Mother 58    PANCREAS  . Heart defect Sister   . Breast cancer Sister     age 83  . Heart disease Sister   . Stroke Sister   . Alcohol abuse Father   . Depression Father   . Hypertension Brother   . Hyperlipidemia Son     Allergies  Allergen Reactions  . Advair Diskus [Fluticasone-Salmeterol] Other (See Comments)    SKIN CHANGES  . Amiodarone Other (See Comments)    PULMONARY  TOXICITY  . Codeine     unknown  . Diovan [Valsartan] Other (See Comments)    HYPOTENSION  . Doxycycline Other (See Comments)    VISUAL DISTURBANCE  . Flexeril [Cyclobenzaprine] Other (See Comments)    FATIGUE  . Keflex [Cephalexin] Diarrhea  . Verapamil Other (See Comments)    EDEMA    Current outpatient prescriptions:allopurinol (ZYLOPRIM) 300 MG tablet, TAKE 1 TABLET BY MOUTH DAILY, Disp: 90 tablet, Rfl: 1;  aspirin 81 MG tablet, Take 81 mg by mouth daily.  , Disp: , Rfl: ;  benazepril (LOTENSIN) 20 MG tablet, Take 10 mg by mouth daily. , Disp: , Rfl: ;  benzonatate (TESSALON) 100 MG capsule, Take 1 capsule (100 mg total) by mouth 3 (three) times daily as needed for cough., Disp: 20 capsule, Rfl: 0 bisoprolol-hydrochlorothiazide (ZIAC) 5-6.25 MG per tablet, Take 1 tablet by mouth daily. , Disp: , Rfl: ;  digoxin (LANOXIN) 0.25 MG tablet, Take 250 mcg by mouth daily. , Disp: , Rfl: ;  furosemide (LASIX) 80 MG tablet, Take 40 mg by mouth daily. daily unless Blood Pressure is under 100, Disp: , Rfl: ;  gabapentin (NEURONTIN) 300 MG capsule, TAKE ONE CAPSULE BY MOUTH 3 TIMES  A DAY, Disp: 270 capsule, Rfl: 3 levothyroxine (SYNTHROID, LEVOTHROID) 50 MCG tablet, Take 50 mcg by mouth at bedtime. , Disp: , Rfl: ;  montelukast (SINGULAIR) 10 MG tablet, Take 10 mg by mouth daily as needed. For allergy, Disp: , Rfl: ;  pravastatin (PRAVACHOL) 40 MG tablet, Take 40 mg by mouth at bedtime.  , Disp: , Rfl: ;  prochlorperazine (COMPAZINE) 5 MG tablet, Take 5 mg by mouth every 6 (six) hours as needed. Dizzy, Disp: , Rfl:  roflumilast (DALIRESP) 500 MCG TABS tablet, Take 500 mcg by mouth daily., Disp: , Rfl: ;  warfarin (COUMADIN) 2 MG tablet, Take 2 mg by mouth daily.  , Disp: , Rfl: ;  pantoprazole (PROTONIX) 40 MG tablet, Take 1 tablet (40 mg total) by mouth daily., Disp: 30 tablet, Rfl: 0  BP 107/61  Pulse 84  Resp 16  Ht 5\' 4"  (1.626 m)  Wt 170 lb (77.111 kg)  BMI 29.17 kg/m2  Body mass index is  29.17 kg/(m^2).           Review of Systems denies chest pain but does have mild dyspnea on exertion due to COPD. No claudication symptoms. Does have back discomfort on occasion. Other systems negative and complete review of systems. Has history of atrial fibrillation and is on chronic Coumadin therapy    Objective:   Physical Exam BP 107/61  Pulse 84  Resp 16  Ht 5\' 4"  (1.626 m)  Wt 170 lb (77.111 kg)  BMI 29.17 kg/m2  Gen.-alert and oriented x3 in no apparent distress HEENT normal for age Lungs no rhonchi or wheezing Cardiovascular regular rhythm no murmurs carotid pulses 3+ palpable no bruits audible Abdomen soft nontender -5 cm pulsatile mass in the midepigastrium Musculoskeletal free of  major deformities Skin clear -no rashes Neurologic normal Lower extremities 3+ femoral and dorsalis pedis pulses palpable bilaterally with no edema  As noted I have reviewed CT angiogram which reveals infrarenal abdominal aortic aneurysm measuring proximally 5 cm which has satisfactory infrarenal neck for aortic stent graft       Assessment:     Abdominal aortic aneurysm History of coronary artery disease-stable with EF 50% COPD-stable History of atrial fibrillation-on Coumadin therapy    Plan:     We'll schedule insertion of aortic stent graft on Wednesday, January 28. We'll discontinue Coumadin following January 21 dose. Risks and benefits thoroughly discussed with patient and she would like to proceed

## 2013-03-14 ENCOUNTER — Other Ambulatory Visit: Payer: Self-pay | Admitting: Emergency Medicine

## 2013-03-14 MED ORDER — MONTELUKAST SODIUM 10 MG PO TABS: 10.0000 mg | ORAL_TABLET | Freq: Every day | ORAL | Status: DC | PRN

## 2013-03-20 ENCOUNTER — Other Ambulatory Visit: Payer: Self-pay | Admitting: *Deleted

## 2013-03-20 ENCOUNTER — Encounter: Payer: Self-pay | Admitting: *Deleted

## 2013-03-21 ENCOUNTER — Ambulatory Visit (INDEPENDENT_AMBULATORY_CARE_PROVIDER_SITE_OTHER): Payer: Medicare Other | Admitting: Emergency Medicine

## 2013-03-21 ENCOUNTER — Encounter: Payer: Self-pay | Admitting: Emergency Medicine

## 2013-03-21 VITALS — BP 110/62 | HR 62 | Temp 98.6°F | Resp 18 | Ht 63.25 in | Wt 180.0 lb

## 2013-03-21 DIAGNOSIS — R252 Cramp and spasm: Secondary | ICD-10-CM

## 2013-03-21 DIAGNOSIS — J309 Allergic rhinitis, unspecified: Secondary | ICD-10-CM

## 2013-03-21 DIAGNOSIS — I4891 Unspecified atrial fibrillation: Secondary | ICD-10-CM

## 2013-03-21 LAB — CBC WITH DIFFERENTIAL/PLATELET
BASOS ABS: 0.1 10*3/uL (ref 0.0–0.1)
Basophils Relative: 1 % (ref 0–1)
EOS PCT: 5 % (ref 0–5)
Eosinophils Absolute: 0.4 10*3/uL (ref 0.0–0.7)
HCT: 43 % (ref 36.0–46.0)
Hemoglobin: 14.7 g/dL (ref 12.0–15.0)
LYMPHS ABS: 2.3 10*3/uL (ref 0.7–4.0)
Lymphocytes Relative: 27 % (ref 12–46)
MCH: 30.5 pg (ref 26.0–34.0)
MCHC: 34.2 g/dL (ref 30.0–36.0)
MCV: 89.2 fL (ref 78.0–100.0)
Monocytes Absolute: 0.8 10*3/uL (ref 0.1–1.0)
Monocytes Relative: 9 % (ref 3–12)
NEUTROS PCT: 58 % (ref 43–77)
Neutro Abs: 4.9 10*3/uL (ref 1.7–7.7)
PLATELETS: 286 10*3/uL (ref 150–400)
RBC: 4.82 MIL/uL (ref 3.87–5.11)
RDW: 14.1 % (ref 11.5–15.5)
WBC: 8.4 10*3/uL (ref 4.0–10.5)

## 2013-03-21 LAB — BASIC METABOLIC PANEL WITH GFR
BUN: 17 mg/dL (ref 6–23)
CALCIUM: 9.8 mg/dL (ref 8.4–10.5)
CO2: 26 meq/L (ref 19–32)
CREATININE: 1.24 mg/dL — AB (ref 0.50–1.10)
Chloride: 99 mEq/L (ref 96–112)
GFR, EST AFRICAN AMERICAN: 50 mL/min — AB
GFR, Est Non African American: 44 mL/min — ABNORMAL LOW
Glucose, Bld: 89 mg/dL (ref 70–99)
Potassium: 4.6 mEq/L (ref 3.5–5.3)
SODIUM: 138 meq/L (ref 135–145)

## 2013-03-21 NOTE — Patient Instructions (Signed)
Atrial Fibrillation °Atrial fibrillation is a condition that causes your heart to beat irregularly. It may also cause your heart to beat faster than normal. Atrial fibrillation can prevent your heart from pumping blood normally. It increases your risk of stroke and heart problems. °HOME CARE °· Take medications as told by your doctor. °· Only take medications that your doctor says are safe. Some medications can make the condition worse or happen again. °· If blood thinners were prescribed by your doctor, take them exactly as told. Too much can cause bleeding. Too little and you will not have the needed protection against stroke and other problems. °· Perform blood tests at home if told by your doctor. °· Perform blood tests exactly as told by your doctor. °· Do not drink alcohol. °· Do not drink beverages with caffeine such as coffee, soda, and some teas. °· Maintain a healthy weight. °· Do not use diet pills unless your doctor says they are safe. They may make heart problems worse. °· Follow diet instructions as told by your doctor. °· Exercise regularly as told by your doctor. °· Keep all follow-up appointments. °GET HELP RIGHT AWAY IF:  °· You have chest or belly (abdominal) pain. °· You feel sick to your stomach (nauseous) °· You suddenly have swollen feet and ankles. °· You feel dizzy. °· You face, arms, or legs feel numb or weak. °· There is a change in your vision or speech. °· You notice a change in the speed, rhythm, or strength of your heartbeat. °· You suddenly begin peeing (urinating) more often. °· You get tired more easily when moving or exercising. °MAKE SURE YOU:  °· Understand these instructions. °· Will watch your condition. °· Will get help right away if you are not doing well or get worse. °Document Released: 12/09/2007 Document Revised: 06/26/2012 Document Reviewed: 04/11/2012 °[employer]® Patient Information ©2014 [employer]. ° °

## 2013-03-21 NOTE — Progress Notes (Signed)
Subjective:    Patient ID: Katie [name], female    DOB: 07/12/1940, 73 y.o.   MRN: [id number]  HPI Comments: 73 yo female presents  for AFib prophylaxis with Coumadin. She denies bruising/ bleeding or changes with dosing. She has had cramping in toes on / off mustard helps. Last sodium/ chloride labs were low. Potassium was WNL 02/2013.   She always has chronic allergy drainage. She is starting to feel more congested, she is not taking allergy OTC AD.   Current Outpatient Prescriptions on File Prior to Visit  Medication Sig Dispense Refill  . allopurinol (ZYLOPRIM) 300 MG tablet TAKE 1 TABLET BY MOUTH DAILY  90 tablet  1  . aspirin 81 MG tablet Take 81 mg by mouth daily.        . benazepril (LOTENSIN) 20 MG tablet Take 10 mg by mouth daily.       . bisoprolol-hydrochlorothiazide (ZIAC) 5-6.25 MG per tablet Take 1 tablet by mouth daily.       . digoxin (LANOXIN) 0.25 MG tablet Take 250 mcg by mouth daily.       . furosemide (LASIX) 80 MG tablet Take 40 mg by mouth daily. daily unless Blood Pressure is under 100      . gabapentin (NEURONTIN) 300 MG capsule TAKE ONE CAPSULE BY MOUTH 3 TIMES A DAY  270 capsule  3  . levothyroxine (SYNTHROID, LEVOTHROID) 50 MCG tablet Take 50 mcg by mouth at bedtime.       . montelukast (SINGULAIR) 10 MG tablet Take 1 tablet (10 mg total) by mouth daily as needed. For allergy  90 tablet  3  . pravastatin (PRAVACHOL) 40 MG tablet Take 40 mg by mouth at bedtime.        . prochlorperazine (COMPAZINE) 5 MG tablet Take 5 mg by mouth every 6 (six) hours as needed. Dizzy      . roflumilast (DALIRESP) 500 MCG TABS tablet Take 500 mcg by mouth daily.      Marland Kitchen warfarin (COUMADIN) 2 MG tablet Take 2 mg by mouth daily. 2 mg Tuesdays and Thursdays and 3 mg M,W,F,S,Sun      . benzonatate (TESSALON) 100 MG capsule Take 1 capsule (100 mg total) by mouth 3 (three) times daily as needed for cough.  20 capsule  0  . pantoprazole (PROTONIX) 40 MG tablet Take 1 tablet (40 mg total)  by mouth daily.  30 tablet  0   No current facility-administered medications on file prior to visit.   ALLERGIES Advair diskus; Amiodarone; Codeine; Diovan; Doxycycline; Flexeril; Keflex; and Verapamil  Past Medical History  Diagnosis Date  . Atrial fibrillation   . COPD (chronic obstructive pulmonary disease)   . Bronchitis, chronic   . History of Bell's palsy   . Hypertension   . Coronary artery disease   . Migraine headache   . Atrial flutter   . Tobacco abuse   . Osteopenia   . Cervical dysplasia   . Thyroid disease     Hypo  . Elevated cholesterol   . Unspecified vitamin D deficiency      Review of Systems  HENT: Positive for congestion and postnasal drip.   Musculoskeletal: Positive for myalgias.  All other systems reviewed and are negative.   BP 110/62  Pulse 62  Temp(Src) 98.6 F (37 C) (Temporal)  Resp 18  Ht 5' 3.25" (1.607 m)  Wt 180 lb (81.647 kg)  BMI 31.62 kg/m2     Objective:   Physical  Exam  Nursing note and vitals reviewed. Constitutional: She is oriented to person, place, and time. She appears well-developed and well-nourished. No distress.  HENT:  Head: Normocephalic and atraumatic.  Right Ear: External ear normal.  Left Ear: External ear normal.  Nose: Nose normal.  Mouth/Throat: Oropharynx is clear and moist. No oropharyngeal exudate.  Cloudy TMS  Eyes: Conjunctivae and EOM are normal.  Neck: Normal range of motion. Neck supple. No JVD present. No thyromegaly present.  Cardiovascular: Normal rate, regular rhythm, normal heart sounds and intact distal pulses.   Pulmonary/Chest: Effort normal and breath sounds normal.  Abdominal: Soft. Bowel sounds are normal. She exhibits no distension and no mass. There is no tenderness. There is no rebound and no guarding.  Musculoskeletal: Normal range of motion. She exhibits no edema and no tenderness.  Lymphadenopathy:    She has no cervical adenopathy.  Neurological: She is alert and oriented to  person, place, and time. No cranial nerve deficit.  Skin: Skin is warm and dry. No rash noted. No erythema. No pallor.  Psychiatric: She has a normal mood and affect. Her behavior is normal. Judgment and thought content normal.          Assessment & Plan:  1. Afib prophylaxis- Check labs, Call office with any unusual bleeding/ bruising/ weakness 2. Allergic rhinitis- Allegra OTC, increase H2o, allergy hygiene explained. Explained repetive use of ABX could lead to resistance will check labs to see if needed with probable allergy vs viral Symptoms 3. Cramping- check labs, push electrolytes

## 2013-03-22 LAB — PROTIME-INR
INR: 2.02 — ABNORMAL HIGH (ref <1.50)
PROTHROMBIN TIME: 22.4 s — AB (ref 11.6–15.2)

## 2013-03-23 ENCOUNTER — Other Ambulatory Visit: Payer: Self-pay | Admitting: Internal Medicine

## 2013-03-23 ENCOUNTER — Encounter: Payer: Self-pay | Admitting: Vascular Surgery

## 2013-03-28 ENCOUNTER — Encounter (HOSPITAL_COMMUNITY): Payer: Self-pay | Admitting: Pharmacy Technician

## 2013-04-03 ENCOUNTER — Encounter (HOSPITAL_COMMUNITY)
Admission: RE | Admit: 2013-04-03 | Discharge: 2013-04-03 | Disposition: A | Discharge status: Home / Self Care | Payer: Medicare Other | Source: Ambulatory Visit | Attending: Vascular Surgery | Admitting: Vascular Surgery

## 2013-04-03 ENCOUNTER — Encounter (HOSPITAL_COMMUNITY): Payer: Self-pay

## 2013-04-03 DIAGNOSIS — Z01818 Encounter for other preprocedural examination: Secondary | ICD-10-CM | POA: Insufficient documentation

## 2013-04-03 DIAGNOSIS — Z01812 Encounter for preprocedural laboratory examination: Secondary | ICD-10-CM | POA: Insufficient documentation

## 2013-04-03 HISTORY — DX: Gout, unspecified: M10.9

## 2013-04-03 HISTORY — DX: Unspecified osteoarthritis, unspecified site: M19.90

## 2013-04-03 LAB — CBC
HEMATOCRIT: 45.8 % (ref 36.0–46.0)
Hemoglobin: 15.8 g/dL — ABNORMAL HIGH (ref 12.0–15.0)
MCH: 31.6 pg (ref 26.0–34.0)
MCHC: 34.5 g/dL (ref 30.0–36.0)
MCV: 91.6 fL (ref 78.0–100.0)
Platelets: 231 10*3/uL (ref 150–400)
RBC: 5 MIL/uL (ref 3.87–5.11)
RDW: 14.3 % (ref 11.5–15.5)
WBC: 8.7 10*3/uL (ref 4.0–10.5)

## 2013-04-03 LAB — ABO/RH: ABO/RH(D): A POS

## 2013-04-03 LAB — APTT: aPTT: 37 seconds (ref 24–37)

## 2013-04-03 LAB — BLOOD GAS, ARTERIAL
Acid-Base Excess: 2.1 mmol/L — ABNORMAL HIGH (ref 0.0–2.0)
Bicarbonate: 26.2 mEq/L — ABNORMAL HIGH (ref 20.0–24.0)
Drawn by: 206361
FIO2: 0.21 %
O2 SAT: 95.1 %
PATIENT TEMPERATURE: 98.6
TCO2: 27.4 mmol/L (ref 0–100)
pCO2 arterial: 41.2 mmHg (ref 35.0–45.0)
pH, Arterial: 7.419 (ref 7.350–7.450)
pO2, Arterial: 70.4 mmHg — ABNORMAL LOW (ref 80.0–100.0)

## 2013-04-03 LAB — COMPREHENSIVE METABOLIC PANEL
ALK PHOS: 43 U/L (ref 39–117)
ALT: 12 U/L (ref 0–35)
AST: 20 U/L (ref 0–37)
Albumin: 4 g/dL (ref 3.5–5.2)
BUN: 22 mg/dL (ref 6–23)
CO2: 21 meq/L (ref 19–32)
Calcium: 9.8 mg/dL (ref 8.4–10.5)
Chloride: 100 mEq/L (ref 96–112)
Creatinine, Ser: 1.19 mg/dL — ABNORMAL HIGH (ref 0.50–1.10)
GFR, EST AFRICAN AMERICAN: 52 mL/min — AB (ref >90)
GFR, EST NON AFRICAN AMERICAN: 45 mL/min — AB (ref >90)
GLUCOSE: 104 mg/dL — AB (ref 70–99)
POTASSIUM: 4.8 meq/L (ref 3.7–5.3)
Sodium: 138 mEq/L (ref 137–147)
TOTAL PROTEIN: 7.3 g/dL (ref 6.0–8.3)
Total Bilirubin: 0.7 mg/dL (ref 0.3–1.2)

## 2013-04-03 LAB — URINALYSIS, ROUTINE W REFLEX MICROSCOPIC
Bilirubin Urine: NEGATIVE
GLUCOSE, UA: NEGATIVE mg/dL
HGB URINE DIPSTICK: NEGATIVE
Ketones, ur: NEGATIVE mg/dL
Nitrite: NEGATIVE
PH: 6 (ref 5.0–8.0)
Protein, ur: NEGATIVE mg/dL
SPECIFIC GRAVITY, URINE: 1.008 (ref 1.005–1.030)
Urobilinogen, UA: 0.2 mg/dL (ref 0.0–1.0)

## 2013-04-03 LAB — SURGICAL PCR SCREEN
MRSA, PCR: NEGATIVE
Staphylococcus aureus: NEGATIVE

## 2013-04-03 LAB — PROTIME-INR
INR: 1.77 — ABNORMAL HIGH (ref 0.00–1.49)
PROTHROMBIN TIME: 20.1 s — AB (ref 11.6–15.2)

## 2013-04-03 LAB — PREPARE RBC (CROSSMATCH)

## 2013-04-03 LAB — URINE MICROSCOPIC-ADD ON

## 2013-04-03 NOTE — Progress Notes (Signed)
Patient informed Nurse that she had a stress test last year and cath report (05/24/03) is in EPIC. Patient denied having any acute shortness of breath and stated she last had chest "tightness" two or three months ago. When asked about Coumadin patient informed Nurse that she was instructed to stop it on 04/04/13, and when asked about Aspirin patient stated she was not instructed to stop taking it but she would call Dr. Evelena Leyden office to clarify. Lastly patient informed Nurse that she developed a cough and sputum was white in consistency and had been taking Mucinex for the past week, but stopped taking it 04/01/13. Will send chart to [place], [place] for review due to cardiac hx.

## 2013-04-03 NOTE — Pre-Procedure Instructions (Signed)
Katie [name]  04/03/2013   Your procedure is scheduled on:  Wednesday April 11, 2013 at 8:30 AM.  Report to [facility] Short Stay Entrance "A"  Admitting at 6:30 AM.  Call this number if you have problems the morning of surgery: [phone]   Remember:   Do not eat food or drink liquids after midnight.   Take these medicines the morning of surgery with A SIP OF WATER: Aspirin, Bisoprolol (Ziac), Digoxin (Lanoxin), Gabapentin (Neurontin), Levothyroxine (Synthroid), Roflumilast (Daliresp), and eye drops.   Do not wear jewelry, make-up or nail polish.  Do not wear lotions, powders, or perfumes. You may wear deodorant.  Do not shave 48 hours prior to surgery.   Do not bring valuables to the hospital.  [facility] is not responsible for any belongings or valuables.               Contacts, dentures or bridgework may not be worn into surgery.  Leave suitcase in the car. After surgery it may be brought to your room.  For patients admitted to the hospital, discharge time is determined by your treatment team.               Patients discharged the day of surgery will not be allowed to drive home.  Name and phone number of your driver: Family/Friend  Special Instructions: Shower using CHG 2 nights before surgery and the night before surgery.  If you shower the day of surgery use CHG.  Use special wash - you have one bottle of CHG for all showers.  You should use approximately 1/3 of the bottle for each shower.   Please read over the following fact sheets that you were given: Pain Booklet, Coughing and Deep Breathing, Blood Transfusion Information, MRSA Information and Surgical Site Infection Prevention

## 2013-04-04 ENCOUNTER — Encounter (HOSPITAL_COMMUNITY): Payer: Self-pay

## 2013-04-04 NOTE — Progress Notes (Signed)
Anesthesia chart review: Patient is a 73 year old female scheduled for EVAR AAA on 1/28 2013 by Dr. Kellie Simmering. History includes AAA, former smoker, afib, chronic combined CHF, COPD, CAD, HTN, hypothyroidism, hiatal hernia, mild AS with mild to moderate AR by 11/2012 echo.  She has a daughter who is hard to wake up from anesthesia. PCP is Dr. [name]. Cardiologist is Dr. Crissie Sickles.  According to Dr. Evelena Leyden 02/13/13 note, he spoke with Dr. Lovena Le about need for surgical repair of AAA.  As long as stent graft was planned, he did not think further preoperative cardiac testing was needed.  Nuclear stress test on 09/25/12 showed small anterior and septal wall infarct, no ischemia, EF 50% with apical hypokinesis.  Echo on 11/16/12 showed: The patient was in atrial fibrillation. Normal LV size with mild LV hypertrophy. EF 50% with septal-lateral dyssynchrony. Normal RV size and systolic function. Mild aortic stenosis and mild-moderate aortic insufficiency. Mild mitral regurgitation. Mild pulmonary hypertension.  Cardiac cath on 05/24/03 showed: 1. Moderately decreased left ventricular systolic function with global hypokinesis consistent with a nonischemic cardiomyopathy. EF 30-35%. 2. Mild-to-moderate mitral regurgitation.  3. Moderate, but nonobstructive coronary artery disease (20% mid LAD, 50% proximal OM1 and after its bifurcation 40% medial limb and 30% lateral limb stenosis, 50% mid RCA)..   CXR on 02/16/13 showed: Stable findings of cardiomegaly, lung hyperexpansion and bronchitic change without acute cardio pulmonary disease.  Preoperative labs noted.  PT/INR 20.1/1.77. She is scheduled to hold Coumadin starting today. Repeat PT/INR on arrival.  If follow-up labs acceptable and no acute changes then I would anticipate that she could proceed as planned.  George Hugh [facility] Short Stay Center/Anesthesiology Phone [phone] 04/04/2013 12:39 PM

## 2013-04-10 MED ORDER — SODIUM CHLORIDE 0.9 % IV SOLN
1500.0000 mg | INTRAVENOUS | Status: AC
  Administered 2013-04-11: 1500 mg via INTRAVENOUS
  Filled 2013-04-10: qty 1500

## 2013-04-11 ENCOUNTER — Encounter (HOSPITAL_COMMUNITY): Payer: Self-pay | Admitting: Anesthesiology

## 2013-04-11 ENCOUNTER — Encounter (HOSPITAL_COMMUNITY)
Admission: RE | Disposition: A | Discharge status: Home / Self Care | Payer: Self-pay | Source: Ambulatory Visit | Attending: Vascular Surgery

## 2013-04-11 ENCOUNTER — Inpatient Hospital Stay (HOSPITAL_COMMUNITY): Payer: Medicare Other

## 2013-04-11 ENCOUNTER — Encounter (HOSPITAL_COMMUNITY): Payer: Medicare Other | Admitting: Vascular Surgery

## 2013-04-11 ENCOUNTER — Inpatient Hospital Stay (HOSPITAL_COMMUNITY): Payer: Medicare Other | Admitting: Anesthesiology

## 2013-04-11 ENCOUNTER — Inpatient Hospital Stay (HOSPITAL_COMMUNITY)
Admission: RE | Admit: 2013-04-11 | Discharge: 2013-04-13 | DRG: 238 | Disposition: A | Discharge status: Home / Self Care | Payer: Medicare Other | Source: Ambulatory Visit | Attending: Vascular Surgery | Admitting: Vascular Surgery

## 2013-04-11 DIAGNOSIS — Z87891 Personal history of nicotine dependence: Secondary | ICD-10-CM

## 2013-04-11 DIAGNOSIS — J4489 Other specified chronic obstructive pulmonary disease: Secondary | ICD-10-CM | POA: Diagnosis present

## 2013-04-11 DIAGNOSIS — R339 Retention of urine, unspecified: Secondary | ICD-10-CM | POA: Diagnosis not present

## 2013-04-11 DIAGNOSIS — I714 Abdominal aortic aneurysm, without rupture, unspecified: Secondary | ICD-10-CM

## 2013-04-11 DIAGNOSIS — Z8249 Family history of ischemic heart disease and other diseases of the circulatory system: Secondary | ICD-10-CM

## 2013-04-11 DIAGNOSIS — I708 Atherosclerosis of other arteries: Secondary | ICD-10-CM | POA: Diagnosis present

## 2013-04-11 DIAGNOSIS — D62 Acute posthemorrhagic anemia: Secondary | ICD-10-CM | POA: Diagnosis not present

## 2013-04-11 DIAGNOSIS — I70209 Unspecified atherosclerosis of native arteries of extremities, unspecified extremity: Secondary | ICD-10-CM | POA: Diagnosis present

## 2013-04-11 DIAGNOSIS — Z823 Family history of stroke: Secondary | ICD-10-CM

## 2013-04-11 DIAGNOSIS — Z7901 Long term (current) use of anticoagulants: Secondary | ICD-10-CM

## 2013-04-11 DIAGNOSIS — Z889 Allergy status to unspecified drugs, medicaments and biological substances status: Secondary | ICD-10-CM

## 2013-04-11 DIAGNOSIS — I4892 Unspecified atrial flutter: Secondary | ICD-10-CM | POA: Diagnosis present

## 2013-04-11 DIAGNOSIS — Z79899 Other long term (current) drug therapy: Secondary | ICD-10-CM

## 2013-04-11 DIAGNOSIS — Z7982 Long term (current) use of aspirin: Secondary | ICD-10-CM

## 2013-04-11 DIAGNOSIS — J449 Chronic obstructive pulmonary disease, unspecified: Secondary | ICD-10-CM | POA: Diagnosis present

## 2013-04-11 DIAGNOSIS — I2510 Atherosclerotic heart disease of native coronary artery without angina pectoris: Secondary | ICD-10-CM | POA: Diagnosis present

## 2013-04-11 DIAGNOSIS — E7800 Pure hypercholesterolemia, unspecified: Secondary | ICD-10-CM | POA: Diagnosis present

## 2013-04-11 DIAGNOSIS — Z803 Family history of malignant neoplasm of breast: Secondary | ICD-10-CM

## 2013-04-11 DIAGNOSIS — I10 Essential (primary) hypertension: Secondary | ICD-10-CM | POA: Diagnosis present

## 2013-04-11 DIAGNOSIS — I4891 Unspecified atrial fibrillation: Secondary | ICD-10-CM

## 2013-04-11 DIAGNOSIS — E039 Hypothyroidism, unspecified: Secondary | ICD-10-CM | POA: Diagnosis present

## 2013-04-11 HISTORY — PX: ABDOMINAL AORTIC ENDOVASCULAR STENT GRAFT: SHX5707

## 2013-04-11 LAB — BASIC METABOLIC PANEL
BUN: 17 mg/dL (ref 6–23)
CALCIUM: 8.7 mg/dL (ref 8.4–10.5)
CO2: 23 mEq/L (ref 19–32)
Chloride: 103 mEq/L (ref 96–112)
Creatinine, Ser: 1.09 mg/dL (ref 0.50–1.10)
GFR, EST AFRICAN AMERICAN: 58 mL/min — AB (ref >90)
GFR, EST NON AFRICAN AMERICAN: 50 mL/min — AB (ref >90)
GLUCOSE: 121 mg/dL — AB (ref 70–99)
POTASSIUM: 4.2 meq/L (ref 3.7–5.3)
SODIUM: 138 meq/L (ref 137–147)

## 2013-04-11 LAB — CBC
HCT: 35.7 % — ABNORMAL LOW (ref 36.0–46.0)
HEMATOCRIT: 36.5 % (ref 36.0–46.0)
HEMOGLOBIN: 12.4 g/dL (ref 12.0–15.0)
Hemoglobin: 12.2 g/dL (ref 12.0–15.0)
MCH: 31.1 pg (ref 26.0–34.0)
MCH: 31.3 pg (ref 26.0–34.0)
MCHC: 34 g/dL (ref 30.0–36.0)
MCHC: 34.2 g/dL (ref 30.0–36.0)
MCV: 91.5 fL (ref 78.0–100.0)
MCV: 91.5 fL (ref 78.0–100.0)
Platelets: 163 10*3/uL (ref 150–400)
Platelets: 166 10*3/uL (ref 150–400)
RBC: 3.99 MIL/uL (ref 3.87–5.11)
RBC: 3.9 MIL/uL (ref 3.87–5.11)
RDW: 13.7 % (ref 11.5–15.5)
RDW: 13.8 % (ref 11.5–15.5)
WBC: 8.5 10*3/uL (ref 4.0–10.5)
WBC: 9.2 10*3/uL (ref 4.0–10.5)

## 2013-04-11 LAB — PROTIME-INR
INR: 0.98 (ref 0.00–1.49)
INR: 1.15 (ref 0.00–1.49)
PROTHROMBIN TIME: 12.8 s (ref 11.6–15.2)
PROTHROMBIN TIME: 14.5 s (ref 11.6–15.2)

## 2013-04-11 LAB — CREATININE, SERUM
CREATININE: 1.11 mg/dL — AB (ref 0.50–1.10)
GFR calc Af Amer: 57 mL/min — ABNORMAL LOW (ref >90)
GFR calc non Af Amer: 49 mL/min — ABNORMAL LOW (ref >90)

## 2013-04-11 LAB — MAGNESIUM: Magnesium: 2 mg/dL (ref 1.5–2.5)

## 2013-04-11 LAB — APTT: aPTT: 30 seconds (ref 24–37)

## 2013-04-11 SURGERY — INSERTION, ENDOVASCULAR STENT GRAFT, AORTA, ABDOMINAL
Anesthesia: General | Site: Abdomen

## 2013-04-11 MED ORDER — WARFARIN SODIUM 2 MG PO TABS: 2.0000 mg | ORAL_TABLET | Freq: Every day | ORAL | Status: DC

## 2013-04-11 MED ORDER — DIGOXIN 125 MCG PO TABS
0.1250 mg | ORAL_TABLET | Freq: Every day | ORAL | Status: DC
  Administered 2013-04-12 – 2013-04-13 (×2): 0.125 mg via ORAL
  Filled 2013-04-11 (×2): qty 1

## 2013-04-11 MED ORDER — VECURONIUM BROMIDE 10 MG IV SOLR
INTRAVENOUS | Status: DC | PRN
  Administered 2013-04-11: 2 mg via INTRAVENOUS
  Administered 2013-04-11: 1 mg via INTRAVENOUS

## 2013-04-11 MED ORDER — EPHEDRINE SULFATE 50 MG/ML IJ SOLN
INTRAMUSCULAR | Status: AC
  Filled 2013-04-11: qty 1

## 2013-04-11 MED ORDER — PROTAMINE SULFATE 10 MG/ML IV SOLN
INTRAVENOUS | Status: DC | PRN
  Administered 2013-04-11: 50 mg via INTRAVENOUS

## 2013-04-11 MED ORDER — LIDOCAINE HCL (CARDIAC) 20 MG/ML IV SOLN
INTRAVENOUS | Status: AC
  Filled 2013-04-11: qty 5

## 2013-04-11 MED ORDER — FENTANYL CITRATE 0.05 MG/ML IJ SOLN
INTRAMUSCULAR | Status: DC | PRN
  Administered 2013-04-11: 50 ug via INTRAVENOUS
  Administered 2013-04-11: 75 ug via INTRAVENOUS
  Administered 2013-04-11: 50 ug via INTRAVENOUS
  Administered 2013-04-11: 125 ug via INTRAVENOUS
  Administered 2013-04-11: 50 ug via INTRAVENOUS

## 2013-04-11 MED ORDER — LOTEPREDNOL ETABONATE 0.5 % OP SUSP
1.0000 [drp] | Freq: Every day | OPHTHALMIC | Status: DC
  Administered 2013-04-11 – 2013-04-12 (×2): 1 [drp] via OPHTHALMIC
  Filled 2013-04-11: qty 5

## 2013-04-11 MED ORDER — FENTANYL CITRATE 0.05 MG/ML IJ SOLN
INTRAMUSCULAR | Status: AC
  Filled 2013-04-11: qty 5

## 2013-04-11 MED ORDER — ALBUMIN HUMAN 5 % IV SOLN
INTRAVENOUS | Status: DC | PRN
  Administered 2013-04-11: 10:00:00 via INTRAVENOUS

## 2013-04-11 MED ORDER — ROCURONIUM BROMIDE 50 MG/5ML IV SOLN
INTRAVENOUS | Status: AC
  Filled 2013-04-11: qty 1

## 2013-04-11 MED ORDER — FENTANYL CITRATE 0.05 MG/ML IJ SOLN
25.0000 ug | INTRAMUSCULAR | Status: DC | PRN
  Administered 2013-04-11 (×4): 25 ug via INTRAVENOUS

## 2013-04-11 MED ORDER — IODIXANOL 320 MG/ML IV SOLN
INTRAVENOUS | Status: DC | PRN
  Administered 2013-04-11: 88.4 mL via INTRAVENOUS

## 2013-04-11 MED ORDER — ACETAMINOPHEN 325 MG PO TABS
325.0000 mg | ORAL_TABLET | ORAL | Status: DC | PRN
  Administered 2013-04-12 – 2013-04-13 (×5): 650 mg via ORAL
  Filled 2013-04-11 (×4): qty 2

## 2013-04-11 MED ORDER — POTASSIUM CHLORIDE CRYS ER 20 MEQ PO TBCR: 20.0000 meq | EXTENDED_RELEASE_TABLET | Freq: Every day | ORAL | Status: DC | PRN

## 2013-04-11 MED ORDER — GUAIFENESIN-DM 100-10 MG/5ML PO SYRP: 15.0000 mL | ORAL_SOLUTION | ORAL | Status: DC | PRN

## 2013-04-11 MED ORDER — ROCURONIUM BROMIDE 100 MG/10ML IV SOLN
INTRAVENOUS | Status: DC | PRN
  Administered 2013-04-11: 50 mg via INTRAVENOUS

## 2013-04-11 MED ORDER — LEVOTHYROXINE SODIUM 50 MCG PO TABS
50.0000 ug | ORAL_TABLET | Freq: Every day | ORAL | Status: DC
  Administered 2013-04-12: 50 ug via ORAL
  Filled 2013-04-11 (×2): qty 1

## 2013-04-11 MED ORDER — FENTANYL CITRATE 0.05 MG/ML IJ SOLN
INTRAMUSCULAR | Status: AC
Start: 2013-04-11 — End: 2013-04-12
  Filled 2013-04-11: qty 2

## 2013-04-11 MED ORDER — FUROSEMIDE 40 MG PO TABS
40.0000 mg | ORAL_TABLET | Freq: Every day | ORAL | Status: DC
  Administered 2013-04-11 – 2013-04-13 (×3): 40 mg via ORAL
  Filled 2013-04-11 (×3): qty 1

## 2013-04-11 MED ORDER — ENOXAPARIN SODIUM 40 MG/0.4ML ~~LOC~~ SOLN
40.0000 mg | SUBCUTANEOUS | Status: DC
  Administered 2013-04-12 – 2013-04-13 (×2): 40 mg via SUBCUTANEOUS
  Filled 2013-04-11 (×3): qty 0.4

## 2013-04-11 MED ORDER — SIMVASTATIN 20 MG PO TABS
20.0000 mg | ORAL_TABLET | Freq: Every day | ORAL | Status: DC
Start: 2013-04-11 — End: 2013-04-13
  Administered 2013-04-12 (×2): 20 mg via ORAL
  Filled 2013-04-11 (×3): qty 1

## 2013-04-11 MED ORDER — PHENYLEPHRINE 40 MCG/ML (10ML) SYRINGE FOR IV PUSH (FOR BLOOD PRESSURE SUPPORT)
PREFILLED_SYRINGE | INTRAVENOUS | Status: AC
  Filled 2013-04-11: qty 10

## 2013-04-11 MED ORDER — HEPARIN SODIUM (PORCINE) 1000 UNIT/ML IJ SOLN
INTRAMUSCULAR | Status: AC
  Filled 2013-04-11: qty 1

## 2013-04-11 MED ORDER — LABETALOL HCL 5 MG/ML IV SOLN: 10.0000 mg | INTRAVENOUS | Status: DC | PRN

## 2013-04-11 MED ORDER — PHENOL 1.4 % MT LIQD: 1.0000 | OROMUCOSAL | Status: DC | PRN

## 2013-04-11 MED ORDER — WARFARIN SODIUM 2 MG PO TABS
2.0000 mg | ORAL_TABLET | ORAL | Status: DC
  Administered 2013-04-12: 2 mg via ORAL
  Filled 2013-04-11: qty 1

## 2013-04-11 MED ORDER — ONDANSETRON HCL 4 MG/2ML IJ SOLN
INTRAMUSCULAR | Status: AC
  Filled 2013-04-11: qty 2

## 2013-04-11 MED ORDER — ACETAMINOPHEN 650 MG RE SUPP: 325.0000 mg | RECTAL | Status: DC | PRN

## 2013-04-11 MED ORDER — HYDROMORPHONE HCL PF 1 MG/ML IJ SOLN
0.5000 mg | INTRAMUSCULAR | Status: DC | PRN
  Administered 2013-04-11 (×2): 0.5 mg via INTRAVENOUS
  Administered 2013-04-11: 1 mg via INTRAVENOUS
  Filled 2013-04-11 (×3): qty 1

## 2013-04-11 MED ORDER — OXYCODONE-ACETAMINOPHEN 5-325 MG PO TABS
1.0000 | ORAL_TABLET | ORAL | Status: DC | PRN
  Administered 2013-04-12: 1 via ORAL
  Filled 2013-04-11: qty 1

## 2013-04-11 MED ORDER — PANTOPRAZOLE SODIUM 40 MG PO TBEC
40.0000 mg | DELAYED_RELEASE_TABLET | Freq: Every day | ORAL | Status: DC
  Administered 2013-04-11 – 2013-04-13 (×3): 40 mg via ORAL
  Filled 2013-04-11 (×3): qty 1

## 2013-04-11 MED ORDER — VANCOMYCIN HCL IN DEXTROSE 1-5 GM/200ML-% IV SOLN
1000.0000 mg | Freq: Two times a day (BID) | INTRAVENOUS | Status: AC
  Administered 2013-04-11 – 2013-04-12 (×2): 1000 mg via INTRAVENOUS
  Filled 2013-04-11 (×2): qty 200

## 2013-04-11 MED ORDER — PROPOFOL 10 MG/ML IV BOLUS
INTRAVENOUS | Status: DC | PRN
  Administered 2013-04-11: 150 mg via INTRAVENOUS

## 2013-04-11 MED ORDER — ASPIRIN 81 MG PO CHEW
81.0000 mg | CHEWABLE_TABLET | Freq: Every day | ORAL | Status: DC
  Administered 2013-04-12 – 2013-04-13 (×2): 81 mg via ORAL
  Filled 2013-04-11 (×2): qty 1

## 2013-04-11 MED ORDER — SODIUM CHLORIDE 0.9 % IV SOLN
INTRAVENOUS | Status: DC
  Administered 2013-04-11 – 2013-04-13 (×7): via INTRAVENOUS

## 2013-04-11 MED ORDER — NEOSTIGMINE METHYLSULFATE 1 MG/ML IJ SOLN
INTRAMUSCULAR | Status: DC | PRN
  Administered 2013-04-11: 3 mg via INTRAVENOUS

## 2013-04-11 MED ORDER — BENZONATATE 100 MG PO CAPS
100.0000 mg | ORAL_CAPSULE | Freq: Three times a day (TID) | ORAL | Status: DC | PRN
  Filled 2013-04-11: qty 1

## 2013-04-11 MED ORDER — LACTATED RINGERS IV SOLN
INTRAVENOUS | Status: DC | PRN
  Administered 2013-04-11: 08:00:00 via INTRAVENOUS

## 2013-04-11 MED ORDER — ROFLUMILAST 500 MCG PO TABS: 500.0000 ug | ORAL_TABLET | Freq: Every day | ORAL | Status: DC

## 2013-04-11 MED ORDER — GLYCOPYRROLATE 0.2 MG/ML IJ SOLN
INTRAMUSCULAR | Status: DC | PRN
  Administered 2013-04-11: 0.6 mg via INTRAVENOUS

## 2013-04-11 MED ORDER — MAGNESIUM SULFATE 40 MG/ML IJ SOLN: 2.0000 g | Freq: Every day | INTRAMUSCULAR | Status: DC | PRN

## 2013-04-11 MED ORDER — GABAPENTIN 300 MG PO CAPS
300.0000 mg | ORAL_CAPSULE | Freq: Three times a day (TID) | ORAL | Status: DC
  Administered 2013-04-11 – 2013-04-13 (×7): 300 mg via ORAL
  Filled 2013-04-11 (×8): qty 1

## 2013-04-11 MED ORDER — HEPARIN SODIUM (PORCINE) 1000 UNIT/ML IJ SOLN
INTRAMUSCULAR | Status: DC | PRN
  Administered 2013-04-11: 2000 [IU] via INTRAVENOUS
  Administered 2013-04-11: 6000 [IU] via INTRAVENOUS

## 2013-04-11 MED ORDER — HYDRALAZINE HCL 20 MG/ML IJ SOLN: 10.0000 mg | INTRAMUSCULAR | Status: DC | PRN

## 2013-04-11 MED ORDER — SODIUM CHLORIDE 0.9 % IV SOLN: 500.0000 mL | Freq: Once | INTRAVENOUS | Status: DC | PRN

## 2013-04-11 MED ORDER — ROFLUMILAST 500 MCG PO TABS
500.0000 ug | ORAL_TABLET | Freq: Every day | ORAL | Status: DC
  Administered 2013-04-12 – 2013-04-13 (×2): 500 ug via ORAL
  Filled 2013-04-11 (×2): qty 1

## 2013-04-11 MED ORDER — ONDANSETRON HCL 4 MG/2ML IJ SOLN
4.0000 mg | Freq: Four times a day (QID) | INTRAMUSCULAR | Status: DC | PRN
  Administered 2013-04-11 (×2): 4 mg via INTRAVENOUS
  Filled 2013-04-11 (×2): qty 2

## 2013-04-11 MED ORDER — BISOPROLOL-HYDROCHLOROTHIAZIDE 5-6.25 MG PO TABS
1.0000 | ORAL_TABLET | Freq: Every day | ORAL | Status: DC
  Administered 2013-04-12 – 2013-04-13 (×2): 1 via ORAL
  Filled 2013-04-11 (×2): qty 1

## 2013-04-11 MED ORDER — MONTELUKAST SODIUM 10 MG PO TABS
10.0000 mg | ORAL_TABLET | Freq: Every day | ORAL | Status: DC
  Administered 2013-04-11 – 2013-04-12 (×2): 10 mg via ORAL
  Filled 2013-04-11 (×3): qty 1

## 2013-04-11 MED ORDER — BISACODYL 10 MG RE SUPP: 10.0000 mg | Freq: Every day | RECTAL | Status: DC | PRN

## 2013-04-11 MED ORDER — SENNOSIDES-DOCUSATE SODIUM 8.6-50 MG PO TABS
1.0000 | ORAL_TABLET | Freq: Every evening | ORAL | Status: DC | PRN
  Filled 2013-04-11: qty 1

## 2013-04-11 MED ORDER — NEOSTIGMINE METHYLSULFATE 1 MG/ML IJ SOLN
INTRAMUSCULAR | Status: AC
  Filled 2013-04-11: qty 10

## 2013-04-11 MED ORDER — ASPIRIN 81 MG PO TABS: 81.0000 mg | ORAL_TABLET | Freq: Every day | ORAL | Status: DC

## 2013-04-11 MED ORDER — LOTEPREDNOL ETABONATE 0.5 % OP SUSP
1.0000 [drp] | Freq: Every day | OPHTHALMIC | Status: DC
  Administered 2013-04-12 – 2013-04-13 (×2): 1 [drp] via OPHTHALMIC
  Filled 2013-04-11: qty 5

## 2013-04-11 MED ORDER — METOPROLOL TARTRATE 1 MG/ML IV SOLN: 2.0000 mg | INTRAVENOUS | Status: DC | PRN

## 2013-04-11 MED ORDER — DIGOXIN 250 MCG PO TABS: 0.1250 ug | ORAL_TABLET | Freq: Every day | ORAL | Status: DC

## 2013-04-11 MED ORDER — DOPAMINE-DEXTROSE 3.2-5 MG/ML-% IV SOLN: 3.0000 ug/kg/min | INTRAVENOUS | Status: DC

## 2013-04-11 MED ORDER — MIDAZOLAM HCL 5 MG/5ML IJ SOLN
INTRAMUSCULAR | Status: DC | PRN
  Administered 2013-04-11: 2 mg via INTRAVENOUS

## 2013-04-11 MED ORDER — WARFARIN SODIUM 3 MG PO TABS
3.0000 mg | ORAL_TABLET | ORAL | Status: DC
  Administered 2013-04-13: 3 mg via ORAL
  Filled 2013-04-11 (×2): qty 1

## 2013-04-11 MED ORDER — SUCCINYLCHOLINE CHLORIDE 20 MG/ML IJ SOLN
INTRAMUSCULAR | Status: AC
  Filled 2013-04-11: qty 1

## 2013-04-11 MED ORDER — PROPOFOL 10 MG/ML IV BOLUS
INTRAVENOUS | Status: AC
  Filled 2013-04-11: qty 20

## 2013-04-11 MED ORDER — BENAZEPRIL HCL 10 MG PO TABS
10.0000 mg | ORAL_TABLET | Freq: Every day | ORAL | Status: DC
  Administered 2013-04-12: 10 mg via ORAL
  Filled 2013-04-11 (×3): qty 1

## 2013-04-11 MED ORDER — DOCUSATE SODIUM 100 MG PO CAPS
100.0000 mg | ORAL_CAPSULE | Freq: Every day | ORAL | Status: DC
  Administered 2013-04-12 – 2013-04-13 (×2): 100 mg via ORAL
  Filled 2013-04-11 (×3): qty 1

## 2013-04-11 MED ORDER — SODIUM CHLORIDE 0.9 % IJ SOLN
INTRAMUSCULAR | Status: AC
  Filled 2013-04-11: qty 10

## 2013-04-11 MED ORDER — MIDAZOLAM HCL 2 MG/2ML IJ SOLN
INTRAMUSCULAR | Status: AC
  Filled 2013-04-11: qty 2

## 2013-04-11 MED ORDER — WARFARIN - PHYSICIAN DOSING INPATIENT: Freq: Every day | Status: DC

## 2013-04-11 MED ORDER — PROCHLORPERAZINE MALEATE 5 MG PO TABS
5.0000 mg | ORAL_TABLET | Freq: Four times a day (QID) | ORAL | Status: DC | PRN
  Filled 2013-04-11: qty 1

## 2013-04-11 MED ORDER — 0.9 % SODIUM CHLORIDE (POUR BTL) OPTIME
TOPICAL | Status: DC | PRN
  Administered 2013-04-11: 1000 mL

## 2013-04-11 MED ORDER — ALLOPURINOL 300 MG PO TABS
300.0000 mg | ORAL_TABLET | Freq: Every day | ORAL | Status: DC
  Administered 2013-04-11 – 2013-04-13 (×3): 300 mg via ORAL
  Filled 2013-04-11 (×3): qty 1

## 2013-04-11 MED ORDER — LACTATED RINGERS IV SOLN
INTRAVENOUS | Status: DC | PRN
  Administered 2013-04-11 (×2): via INTRAVENOUS

## 2013-04-11 MED ORDER — SODIUM CHLORIDE 0.9 % IR SOLN
Status: DC | PRN
  Administered 2013-04-11: 09:00:00

## 2013-04-11 MED ORDER — ALUM & MAG HYDROXIDE-SIMETH 200-200-20 MG/5ML PO SUSP: 15.0000 mL | ORAL | Status: DC | PRN

## 2013-04-11 MED ORDER — LIDOCAINE HCL (CARDIAC) 20 MG/ML IV SOLN
INTRAVENOUS | Status: DC | PRN
  Administered 2013-04-11: 60 mg via INTRAVENOUS

## 2013-04-11 MED ORDER — ONDANSETRON HCL 4 MG/2ML IJ SOLN
INTRAMUSCULAR | Status: DC | PRN
  Administered 2013-04-11: 4 mg via INTRAVENOUS

## 2013-04-11 SURGICAL SUPPLY — 86 items
ADH SKN CLS APL DERMABOND .7 (GAUZE/BANDAGES/DRESSINGS) ×2
BAG BANDED W/RUBBER/TAPE 36X54 (MISCELLANEOUS) ×3 IMPLANT
BAG EQP BAND 135X91 W/RBR TAPE (MISCELLANEOUS) ×1
BAG SNAP BAND KOVER 36X36 (MISCELLANEOUS) ×3 IMPLANT
BALLN MUSTANG 10.0X40 75 (BALLOONS) ×4
BALLN MUSTANG 10.0X40 75CM (BALLOONS) ×2
BALLOON MUSTANG 10.0X40 75 (BALLOONS) IMPLANT
CANISTER SUCTION 2500CC (MISCELLANEOUS) ×3 IMPLANT
CATH BEACON 5.038 65CM KMP-01 (CATHETERS) IMPLANT
CATH OMNI FLUSH .035X70CM (CATHETERS) ×2 IMPLANT
CLIP TI MEDIUM 24 (CLIP) ×2 IMPLANT
CLIP TI WIDE RED SMALL 24 (CLIP) ×2 IMPLANT
COVER DOME SNAP 22 D (MISCELLANEOUS) ×3 IMPLANT
COVER MAYO STAND STRL (DRAPES) ×5 IMPLANT
COVER PROBE W GEL 5X96 (DRAPES) ×3 IMPLANT
COVER SURGICAL LIGHT HANDLE (MISCELLANEOUS) ×3 IMPLANT
DERMABOND ADVANCED (GAUZE/BANDAGES/DRESSINGS) ×4
DERMABOND ADVANCED .7 DNX12 (GAUZE/BANDAGES/DRESSINGS) ×1 IMPLANT
DEVICE CLOSURE PERCLS PRGLD 6F (VASCULAR PRODUCTS) IMPLANT
DRAPE TABLE COVER HEAVY DUTY (DRAPES) ×3 IMPLANT
DRESSING OPSITE X SMALL 2X3 (GAUZE/BANDAGES/DRESSINGS) ×6 IMPLANT
DRYSEAL FLEXSHEATH 12FR 33CM (SHEATH) ×2
DRYSEAL FLEXSHEATH 18FR 33CM (SHEATH) ×2
ELECT CAUTERY BLADE 6.4 (BLADE) ×2 IMPLANT
ELECT REM PT RETURN 9FT ADLT (ELECTROSURGICAL) ×6
ELECTRODE REM PT RTRN 9FT ADLT (ELECTROSURGICAL) ×2 IMPLANT
EXCLUDER TNK LEG 31MX14X13 (Endovascular Graft) IMPLANT
EXCLUDER TRUNK LEG 31MX14X13 (Endovascular Graft) ×3 IMPLANT
GAUZE SPONGE 2X2 8PLY STRL LF (GAUZE/BANDAGES/DRESSINGS) ×1 IMPLANT
GLOVE SS BIOGEL STRL SZ 7 (GLOVE) ×1 IMPLANT
GLOVE SS BIOGEL STRL SZ 7.5 (GLOVE) IMPLANT
GLOVE SUPERSENSE BIOGEL SZ 7 (GLOVE) ×2
GLOVE SUPERSENSE BIOGEL SZ 7.5 (GLOVE) ×2
GOWN STRL REUS W/ TWL LRG LVL3 (GOWN DISPOSABLE) ×3 IMPLANT
GOWN STRL REUS W/TWL LRG LVL3 (GOWN DISPOSABLE) ×9
GRAFT BALLN CATH 65CM (STENTS) IMPLANT
GRAFT EXCLUDER LEG 12X12 (Endovascular Graft) ×2 IMPLANT
GRAFT EXCLUDER LEG 16X12 (Endovascular Graft) ×2 IMPLANT
HEMASHIELD FINESSE CARDIO (Vascular Products) ×3 IMPLANT
KIT BASIN OR (CUSTOM PROCEDURE TRAY) ×3 IMPLANT
KIT ENCORE 26 ADVANTAGE (KITS) ×4 IMPLANT
KIT ROOM TURNOVER OR (KITS) ×3 IMPLANT
NDL PERC 18GX7CM (NEEDLE) ×1 IMPLANT
NEEDLE PERC 18GX7CM (NEEDLE) ×3 IMPLANT
NS IRRIG 1000ML POUR BTL (IV SOLUTION) ×3 IMPLANT
PACK AORTA (CUSTOM PROCEDURE TRAY) ×3 IMPLANT
PAD ARMBOARD 7.5X6 YLW CONV (MISCELLANEOUS) ×6 IMPLANT
PATCH HEMASHIELD FINESS CARDIO (Vascular Products) IMPLANT
PENCIL BUTTON HOLSTER BLD 10FT (ELECTRODE) ×2 IMPLANT
PERCLOSE PROGLIDE 6F (VASCULAR PRODUCTS) ×18
PROTECTION STATION PRESSURIZED (MISCELLANEOUS) ×3
SHEATH AVANTI 11CM 8FR (MISCELLANEOUS) ×2 IMPLANT
SHEATH BRITE TIP 8FR 23CM (MISCELLANEOUS) ×4 IMPLANT
SHEATH DRYSEAL FLEX 12FR 33CM (SHEATH) IMPLANT
SHEATH DRYSEAL FLEX 18FR 33CM (SHEATH) IMPLANT
SHEATH FAST CATH 10F 12CM (SHEATH) ×4 IMPLANT
SPONGE GAUZE 2X2 STER 10/PKG (GAUZE/BANDAGES/DRESSINGS) ×2
STATION PROTECTION PRESSURIZED (MISCELLANEOUS) IMPLANT
STENT GRAFT BALLN CATH 65CM (STENTS) ×2
STOPCOCK MORSE 400PSI 3WAY (MISCELLANEOUS) ×3 IMPLANT
SUT PROLENE 5 0 C 1 24 (SUTURE) IMPLANT
SUT PROLENE 5 0 CC 1 (SUTURE) IMPLANT
SUT PROLENE 6 0 C 1 30 (SUTURE) ×8 IMPLANT
SUT PROLENE 6 0 CC (SUTURE) ×10 IMPLANT
SUT SILK 2 0 (SUTURE) ×3
SUT SILK 2-0 18XBRD TIE 12 (SUTURE) IMPLANT
SUT SILK 3 0 (SUTURE) ×3
SUT SILK 3-0 18XBRD TIE 12 (SUTURE) IMPLANT
SUT SILK 4 0 (SUTURE) ×3
SUT SILK 4-0 18XBRD TIE 12 (SUTURE) IMPLANT
SUT VIC AB 2-0 CT1 27 (SUTURE) ×6
SUT VIC AB 2-0 CT1 TAPERPNT 27 (SUTURE) IMPLANT
SUT VIC AB 3-0 SH 27 (SUTURE) ×6
SUT VIC AB 3-0 SH 27X BRD (SUTURE) IMPLANT
SUT VICRYL 4-0 PS2 18IN ABS (SUTURE) ×6 IMPLANT
SYR 20CC LL (SYRINGE) ×6 IMPLANT
SYR 30ML LL (SYRINGE) IMPLANT
SYR 5ML LL (SYRINGE) IMPLANT
SYR MEDRAD MARK V 150ML (SYRINGE) ×3 IMPLANT
SYRINGE 10CC LL (SYRINGE) ×6 IMPLANT
TOWEL OR 17X24 6PK STRL BLUE (TOWEL DISPOSABLE) ×6 IMPLANT
TOWEL OR 17X26 10 PK STRL BLUE (TOWEL DISPOSABLE) ×6 IMPLANT
TRAY FOLEY CATH 16FRSI W/METER (SET/KITS/TRAYS/PACK) ×3 IMPLANT
TUBING HIGH PRESSURE 120CM (CONNECTOR) ×3 IMPLANT
WIRE AMPLATZ SS-J .035X180CM (WIRE) ×6 IMPLANT
WIRE BENTSON .035X145CM (WIRE) ×4 IMPLANT

## 2013-04-11 NOTE — Progress Notes (Signed)
Utilization review completed.  

## 2013-04-11 NOTE — Anesthesia Postprocedure Evaluation (Signed)
  Anesthesia Post-op Note  Patient: Katie [name]  Procedure(s) Performed: Procedure(s): ABDOMINAL AORTIC ENDOVASCULAR STENT GRAFT WITH RIGHT FEMORAL PATCH ANGIOPLASTY (N/A)  Patient Location: PACU  Anesthesia Type:General  Level of Consciousness: awake, alert  and oriented  Airway and Oxygen Therapy: Patient Spontanous Breathing and Patient connected to nasal cannula oxygen  Post-op Pain: moderate  Post-op Assessment: Post-op Vital signs reviewed  Post-op Vital Signs: Reviewed  Complications: No apparent anesthesia complications

## 2013-04-11 NOTE — Transfer of Care (Signed)
Immediate Anesthesia Transfer of Care Note  Patient: Katie [name]  Procedure(s) Performed: Procedure(s): ABDOMINAL AORTIC ENDOVASCULAR STENT GRAFT WITH RIGHT FEMORAL PATCH ANGIOPLASTY (N/A)  Patient Location: PACU  Anesthesia Type:General  Level of Consciousness: awake, alert  and oriented  Airway & Oxygen Therapy: Patient Spontanous Breathing and Patient connected to face mask oxygen  Post-op Assessment: Report given to PACU RN and Post -op Vital signs reviewed and stable  Post vital signs: Reviewed and stable  Complications: No apparent anesthesia complications

## 2013-04-11 NOTE — Anesthesia Procedure Notes (Signed)
Procedure Name: Intubation Date/Time: 04/11/2013 8:41 AM Performed by: Daisy Lazar Pre-anesthesia Checklist: Patient identified, Emergency Drugs available, Suction available and Patient being monitored Patient Re-evaluated:Patient Re-evaluated prior to inductionOxygen Delivery Method: Circle system utilized Preoxygenation: Pre-oxygenation with 100% oxygen Intubation Type: IV induction Ventilation: Mask ventilation without difficulty and Oral airway inserted - appropriate to patient size Laryngoscope Size: Mac and 3 Grade View: Grade I Tube type: Oral Tube size: 8.0 mm Number of attempts: 1 Airway Equipment and Method: Stylet Placement Confirmation: ETT inserted through vocal cords under direct vision,  positive ETCO2 and breath sounds checked- equal and bilateral Secured at: 22 cm Tube secured with: Tape Dental Injury: Teeth and Oropharynx as per pre-operative assessment

## 2013-04-11 NOTE — Anesthesia Preprocedure Evaluation (Signed)
Anesthesia Evaluation  Patient identified by MRN, date of birth, ID band Patient awake    Reviewed: Allergy & Precautions, H&P , NPO status , Patient's Chart, lab work & pertinent test results  Airway Mallampati: II      Dental   Pulmonary shortness of breath, COPDformer smoker,          Cardiovascular hypertension, + CAD, + Peripheral Vascular Disease and +CHF     Neuro/Psych    GI/Hepatic hiatal hernia, GERD-  ,  Endo/Other    Renal/GU      Musculoskeletal   Abdominal   Peds  Hematology   Anesthesia Other Findings   Reproductive/Obstetrics                           Anesthesia Physical Anesthesia Plan  ASA: III  Anesthesia Plan: General   Post-op Pain Management:    Induction: Intravenous  Airway Management Planned: Oral ETT  Additional Equipment: Arterial line  Intra-op Plan:   Post-operative Plan: Possible Post-op intubation/ventilation  Informed Consent: I have reviewed the patients History and Physical, chart, labs and discussed the procedure including the risks, benefits and alternatives for the proposed anesthesia with the patient or authorized representative who has indicated his/her understanding and acceptance.   Dental advisory given  Plan Discussed with: CRNA and Anesthesiologist  Anesthesia Plan Comments:         Anesthesia Quick Evaluation

## 2013-04-11 NOTE — Op Note (Signed)
    OPERATIVE REPORT  DATE OF SURGERY: 04/11/2013  PATIENT: Katie [name], 73 y.o. female MRN: [id number]  DOB: 03/30/1940  PRE-OPERATIVE DIAGNOSIS: Abdominal aortic aneurysm  POST-OPERATIVE DIAGNOSIS:  Same  PROCEDURE: Left femoral artery cutdown and exposure and repair following stent graft repair which be dictated as a separate note by Dr. Kellie Simmering  SURGEON:  Curt Jews, M.D.    ANESTHESIA:  Gen.    Total I/O In: 2907.9 [P.O.:60; I.V.:2597.9; IV Piggyback:250] Out: 180 [Urine:180]  BLOOD ADMINISTERED: None   DRAINS: None  SPECIMEN: None  COUNTS CORRECT:  YES  PLAN OF CARE: Recovery room   PATIENT DISPOSITION:  PACU - hemodynamically stable  PROCEDURE DETAILS: Patient is undergoing stent graft repair of abdominal aortic aneurysm. The patient had attempted placement of Perclose device the left groin. There was extensive calcification and the initial Perclose device was cut. Decision was made to perform open exposure. Incision was made and through the sheath which was positioned for the Perclose device. Surgeon was carried down to the entry site into the common femoral artery at the level of the inguinal ligament. The common femoral artery was exposed proximal and distal to the entry of the sheath. Tributary branches were controlled with 2-0 silk Potts ties. The left groin exposure was used for placement of the contralateral limb. At the completion of the procedure the artery was closed primarily after opening this up transversely. The artery was with a running 6-0 Prolene suture. After clamps removed the hemostasis was obtained and the heparin was reversed with protamine. The wound was closed with 2-0 Vicryl in several layers in the subcutaneous tissue and the skin was closed with 3-0 subcuticular Vicryl stitch   Curt Jews, M.D. 04/11/2013 3:39 PM

## 2013-04-11 NOTE — Interval H&P Note (Signed)
History and Physical Interval Note:  04/11/2013 8:18 AM  Katie [name]  has presented today for surgery, with the diagnosis of AAA  The various methods of treatment have been discussed with the patient and family. After consideration of risks, benefits and other options for treatment, the patient has consented to  Procedure(s): ABDOMINAL AORTIC ENDOVASCULAR STENT GRAFT (N/A) as a surgical intervention .  The patient's history has been reviewed, patient examined, no change in status, stable for surgery.  I have reviewed the patient's chart and labs.  Questions were answered to the patient's satisfaction.     Tinnie Gens

## 2013-04-11 NOTE — Plan of Care (Signed)
Problem: Phase I Progression Outcomes Goal: Vascular site scale level 0 - I Vascular Site Scale Level 0: No bruising/bleeding/hematoma Level I (Mild): Bruising/Ecchymosis, minimal bleeding/ooozing, palpable hematoma < 3 cm Level II (Moderate): Bleeding not affecting hemodynamic parameters, pseudoaneurysm, palpable hematoma > 3 cm Level III (Severe) Bleeding which affects hemodynamic parameters or retroperitoneal hemorrhage  Outcome: Progressing Level 0

## 2013-04-11 NOTE — Op Note (Addendum)
OPERATIVE REPORT  Date of Surgery: 04/11/2013  Surgeon: Tinnie Gens, MD  Assistant: Dr. 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 Procedure: Procedure(s): #1 bilateral open femoral artery exposure #2 insertion of an aorto by common iliac Gore-Excluder-C3 stent graft using      A 31 x 14 x 13 cm main body via right      B 12 mm x 12 cm contralateral limb-left       C 12 mm x 7 cm iliac extender on the right #3-bilateral common iliac balloon angioplasty using 10 mm x 4 cm-Mustang catheter With completion angiography Right femoral endarterectomy with Dacron patch angioplasty and primary closure and repair of left common femoral artery   Anesthesia: General  EBL: [id number]  Complications: None  Procedure Details:  Patient to the operating room placed in supine position at which time satisfactory general endotracheal anesthesia was administered. Radial arterial line was inserted by anesthesia. Abdomen and groins were prepped with Betadine scrub and solution draped in routine sterile manner. After an appropriate timeout was called both common femoral arteries were imaged using the SonoSite ultrasound both were widely patent and were noted to have some occlusive disease which appeared to be mainly posteriorly. Attempt was made bilaterally to use the probe wide closure device and this was except unsuccessful on both sides requiring cutdown for exposure. Dr. early performed left side Dr. Kellie Simmering performed the right side. A long 8 French sheath was inserted on the left side and a short 8 French sheath on the right side . Patient was heparinized. A pigtail catheter was inserted through the left side in the suprarenal aorta. The short 8 French sheath on the right was exchanged for an 69 French sheath over an Amplatz superstiff wire. She did traverse the right iliac system with mild difficulty. It was a very small iliac vessel which  was known preoperatively but the sheath did traverse this an angiogram was then performed to localize the renal arteries and 31 x 14 x 13 cm main body device-Excluder-C3-Gore graft was positioned just distal to the renal arteries. This was pulley. Repeat angiogram revealed good positioning however there was some kinking of the contralateral gate. This required reconstructing of the graft and rotating it anteriorly slightly redeployed been returning it to the original position which corrected the problem. Graft was deployed down to the contralateral gate and following this the gate was cannulated using a Kumpe catheter the Bentson wire without difficulty. Retrograde iliac angiogram was performed through the sheath for appropriate measuring of the limb and a 12 mm x 12 cm limb was selected for the contralateral limb. This Tyrone Nine appropriately down to a location just proximal to the origin of left internal iliac. Remainder of the right main body limb was deployed and iliac extender was selected to extend this down to just proximal to the right internal iliac artery. A 12 L by 7 cm iliac extender was utilized. After performing this whole junctions proximally were dilated with 250 balloon. We do that there was significant common iliac occlusive disease and therefore this was dilated with 2 10 mm x 4 cm Mustang angioplasty catheters using a kissing technique. When this was completed a completion angiogram was performed. There was a small type II endoleak from lumbar branches graft was in good position there was good filling of both internal iliac arteries. A retrograde angiogram on the right was performed because of the small and diffusely diseased  iliac system and there did appear to be possibly a mild dissection in the external iliac artery which did not appear to be flow-limiting or obstructive in nature it was elected to follow this. Following that maneuver the sheath was removed from the right side initially. There  was severe disruption of the posterior plaque in the right distal external iliac and common femoral artery junction. This required dissection of the vessel out more proximally and distally down to the origin of the profunda femoris vessel was diffusely diseased with plaque. Also require a longitudinal arterotomy extended about 3-4 cm in both directions. Endarterectomy was then performed up to the clamp with a very irregular posterior plaque being removed. It was transected distally and the common femoral artery and tacked down with several 6-0 Prolene sutures leaving an acceptable lumen. Dacron patch sewn in place a 6-0 Prolene following antegrade and retrograde flushing closure was completed with an excellent pulse and good Doppler flow. On the left side sheath was removed there was good inflow and good back bleeding and there was posterior plaque which was tacked down with individual 6-0 Prolene suture in was repaired with a 6-0 Prolene suture as will be described in Dr. early. Following this adequate hemostasis was achieved protamine given to reverse the heparin wounds were closed in layers with Vicryl subcuticular fashion with Dermabond patient to recovery in stable condition   Tinnie Gens, MD 04/11/2013 12:09 PM

## 2013-04-11 NOTE — H&P (View-Only) (Signed)
Subjective:     Patient ID: Katie [name], female   DOB: 09-12-1940, 73 y.o.   MRN: [id number]  HPI this 73 year old female returns today to further discuss treatment for her abdominal aortic aneurysm which measures 5.1 x 5.0 cm and has been followed for several years. Today she had a CT angiogram of the abdomen and pelvis which I have reviewed by computer. Patient does have what appears to be a satisfactory neck distal to the renal arteries for a seal to allow insertion of an aortic stent graft. 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 She is currently asymptomatic. She's followed by Dr. Crissie Sickles who feels that she is not need further cardiac evaluation for insertion of the stent graft.  Past Medical History  Diagnosis Date  . Atrial fibrillation   . COPD (chronic obstructive pulmonary disease)   . Bronchitis, chronic   . History of Bell's palsy   . Hypertension   . Coronary artery disease   . Migraine headache   . Atrial flutter   . Tobacco abuse   . Osteopenia   . Cervical dysplasia   . Thyroid disease     Hypo  . Elevated cholesterol   . Unspecified vitamin D deficiency     History  Substance Use Topics  . Smoking status: Former Smoker    Types: Cigarettes    Quit date: 08/10/2002  . Smokeless tobacco: Never Used     Comment: History of tobacco abuse  . Alcohol Use: 0.0 oz/week     Comment: rare    Family History  Problem Relation Age of Onset  . Cirrhosis Mother   . Cancer Mother 72    PANCREAS  . Heart defect Sister   . Breast cancer Sister     age 52  . Heart disease Sister   . Stroke Sister   . Alcohol abuse Father   . Depression Father   . Hypertension Brother   . Hyperlipidemia Son     Allergies  Allergen Reactions  . Advair Diskus [Fluticasone-Salmeterol] Other (See Comments)    SKIN CHANGES  . Amiodarone Other (See Comments)    PULMONARY  TOXICITY  . Codeine     unknown  . Diovan [Valsartan] Other (See Comments)    HYPOTENSION  . Doxycycline Other (See Comments)    VISUAL DISTURBANCE  . Flexeril [Cyclobenzaprine] Other (See Comments)    FATIGUE  . Keflex [Cephalexin] Diarrhea  . Verapamil Other (See Comments)    EDEMA    Current outpatient prescriptions:allopurinol (ZYLOPRIM) 300 MG tablet, TAKE 1 TABLET BY MOUTH DAILY, Disp: 90 tablet, Rfl: 1;  aspirin 81 MG tablet, Take 81 mg by mouth daily.  , Disp: , Rfl: ;  benazepril (LOTENSIN) 20 MG tablet, Take 10 mg by mouth daily. , Disp: , Rfl: ;  benzonatate (TESSALON) 100 MG capsule, Take 1 capsule (100 mg total) by mouth 3 (three) times daily as needed for cough., Disp: 20 capsule, Rfl: 0 bisoprolol-hydrochlorothiazide (ZIAC) 5-6.25 MG per tablet, Take 1 tablet by mouth daily. , Disp: , Rfl: ;  digoxin (LANOXIN) 0.25 MG tablet, Take 250 mcg by mouth daily. , Disp: , Rfl: ;  furosemide (LASIX) 80 MG tablet, Take 40 mg by mouth daily. daily unless Blood Pressure is under 100, Disp: , Rfl: ;  gabapentin (NEURONTIN) 300 MG capsule, TAKE ONE CAPSULE BY MOUTH 3 TIMES  A DAY, Disp: 270 capsule, Rfl: 3 levothyroxine (SYNTHROID, LEVOTHROID) 50 MCG tablet, Take 50 mcg by mouth at bedtime. , Disp: , Rfl: ;  montelukast (SINGULAIR) 10 MG tablet, Take 10 mg by mouth daily as needed. For allergy, Disp: , Rfl: ;  pravastatin (PRAVACHOL) 40 MG tablet, Take 40 mg by mouth at bedtime.  , Disp: , Rfl: ;  prochlorperazine (COMPAZINE) 5 MG tablet, Take 5 mg by mouth every 6 (six) hours as needed. Dizzy, Disp: , Rfl:  roflumilast (DALIRESP) 500 MCG TABS tablet, Take 500 mcg by mouth daily., Disp: , Rfl: ;  warfarin (COUMADIN) 2 MG tablet, Take 2 mg by mouth daily.  , Disp: , Rfl: ;  pantoprazole (PROTONIX) 40 MG tablet, Take 1 tablet (40 mg total) by mouth daily., Disp: 30 tablet, Rfl: 0  BP 107/61  Pulse 84  Resp 16  Ht 5\' 4"  (1.626 m)  Wt 170 lb (77.111 kg)  BMI 29.17 kg/m2  Body mass index is  29.17 kg/(m^2).           Review of Systems denies chest pain but does have mild dyspnea on exertion due to COPD. No claudication symptoms. Does have back discomfort on occasion. Other systems negative and complete review of systems. Has history of atrial fibrillation and is on chronic Coumadin therapy    Objective:   Physical Exam BP 107/61  Pulse 84  Resp 16  Ht 5\' 4"  (1.626 m)  Wt 170 lb (77.111 kg)  BMI 29.17 kg/m2  Gen.-alert and oriented x3 in no apparent distress HEENT normal for age Lungs no rhonchi or wheezing Cardiovascular regular rhythm no murmurs carotid pulses 3+ palpable no bruits audible Abdomen soft nontender -5 cm pulsatile mass in the midepigastrium Musculoskeletal free of  major deformities Skin clear -no rashes Neurologic normal Lower extremities 3+ femoral and dorsalis pedis pulses palpable bilaterally with no edema  As noted I have reviewed CT angiogram which reveals infrarenal abdominal aortic aneurysm measuring proximally 5 cm which has satisfactory infrarenal neck for aortic stent graft       Assessment:     Abdominal aortic aneurysm History of coronary artery disease-stable with EF 50% COPD-stable History of atrial fibrillation-on Coumadin therapy    Plan:     We'll schedule insertion of aortic stent graft on Wednesday, January 28. We'll discontinue Coumadin following January 21 dose. Risks and benefits thoroughly discussed with patient and she would like to proceed

## 2013-04-12 ENCOUNTER — Encounter (HOSPITAL_COMMUNITY): Payer: Self-pay | Admitting: Vascular Surgery

## 2013-04-12 LAB — BASIC METABOLIC PANEL
BUN: 16 mg/dL (ref 6–23)
CALCIUM: 8.5 mg/dL (ref 8.4–10.5)
CO2: 22 mEq/L (ref 19–32)
Chloride: 102 mEq/L (ref 96–112)
Creatinine, Ser: 1.13 mg/dL — ABNORMAL HIGH (ref 0.50–1.10)
GFR calc non Af Amer: 48 mL/min — ABNORMAL LOW (ref >90)
GFR, EST AFRICAN AMERICAN: 55 mL/min — AB (ref >90)
GLUCOSE: 106 mg/dL — AB (ref 70–99)
Potassium: 4.2 mEq/L (ref 3.7–5.3)
SODIUM: 137 meq/L (ref 137–147)

## 2013-04-12 LAB — CBC
HCT: 35.4 % — ABNORMAL LOW (ref 36.0–46.0)
HEMATOCRIT: 34.3 % — AB (ref 36.0–46.0)
HEMOGLOBIN: 11.7 g/dL — AB (ref 12.0–15.0)
HEMOGLOBIN: 11.3 g/dL — AB (ref 12.0–15.0)
MCH: 30.5 pg (ref 26.0–34.0)
MCH: 31.1 pg (ref 26.0–34.0)
MCHC: 33.1 g/dL (ref 30.0–36.0)
MCHC: 32.9 g/dL (ref 30.0–36.0)
MCV: 92.4 fL (ref 78.0–100.0)
MCV: 94.5 fL (ref 78.0–100.0)
Platelets: 164 10*3/uL (ref 150–400)
Platelets: 140 10*3/uL — ABNORMAL LOW (ref 150–400)
RBC: 3.83 MIL/uL — ABNORMAL LOW (ref 3.87–5.11)
RBC: 3.63 MIL/uL — ABNORMAL LOW (ref 3.87–5.11)
RDW: 14.1 % (ref 11.5–15.5)
RDW: 14.4 % (ref 11.5–15.5)
WBC: 9.5 10*3/uL (ref 4.0–10.5)
WBC: 8.8 10*3/uL (ref 4.0–10.5)

## 2013-04-12 LAB — PROTIME-INR
INR: 1.07 (ref 0.00–1.49)
Prothrombin Time: 13.7 seconds (ref 11.6–15.2)

## 2013-04-12 LAB — GLUCOSE, CAPILLARY: GLUCOSE-CAPILLARY: 104 mg/dL — AB (ref 70–99)

## 2013-04-12 MED ORDER — ALBUMIN HUMAN 25 % IV SOLN
12.5000 g | Freq: Once | INTRAVENOUS | Status: AC
  Administered 2013-04-12: 12.5 g via INTRAVENOUS
  Filled 2013-04-12: qty 50

## 2013-04-12 MED ORDER — SODIUM CHLORIDE 0.9 % IV BOLUS (SEPSIS)
500.0000 mL | Freq: Once | INTRAVENOUS | Status: AC
  Administered 2013-04-12: 500 mL via INTRAVENOUS

## 2013-04-12 NOTE — Progress Notes (Signed)
04/12/2013 3:49 PM  Patient's BP has been running soft in the 80's and 90's.  Dr. Kellie Simmering notified.  New orders received.  We will continue to monitor patient.  Tilda Burrow Everhart

## 2013-04-12 NOTE — Progress Notes (Signed)
Per order set, 546mL NS bolus administered for symptomatic hypotension and SBP<100.

## 2013-04-12 NOTE — Progress Notes (Addendum)
[facility] Progress Note  04/12/2013 8:55 AM 1 Day Post-Op  Subjective:  C/o nausea, sore throat, soreness in groins.  Also states some blood from rectum on pad when she gets up-probably from hemorrhoids from drinking mag citrate per patient.  Afebrile [id number] systolic HR 123456 irregular 94% RA  Filed Vitals:   04/12/13 0800  BP: 96/44  Pulse: 86  Temp: 97.9 F (36.6 C)  Resp: 24    Physical Exam: Incisions:  Bilateral groins c/d/i Extremities:  Palpable DP pulses bilaterally Cardiac:  Irregular Lungs:  Non labored Abdomen:  Soft NT/ND  CBC    Component Value Date/Time   WBC 9.5 04/12/2013 0320   RBC 3.83* 04/12/2013 0320   HGB 11.7* 04/12/2013 0320   HCT 35.4* 04/12/2013 0320   PLT 164 04/12/2013 0320   MCV 92.4 04/12/2013 0320   MCH 30.5 04/12/2013 0320   MCHC 33.1 04/12/2013 0320   RDW 14.1 04/12/2013 0320   LYMPHSABS 2.3 03/21/2013 1050   MONOABS 0.8 03/21/2013 1050   EOSABS 0.4 03/21/2013 1050   BASOSABS 0.1 03/21/2013 1050    BMET    Component Value Date/Time   NA 137 04/12/2013 0320   K 4.2 04/12/2013 0320   CL 102 04/12/2013 0320   CO2 22 04/12/2013 0320   GLUCOSE 106* 04/12/2013 0320   BUN 16 04/12/2013 0320   CREATININE 1.13* 04/12/2013 0320   CREATININE 1.24* 03/21/2013 1050   CALCIUM 8.5 04/12/2013 0320   GFRNONAA 48* 04/12/2013 0320   GFRAA 55* 04/12/2013 0320    INR    Component Value Date/Time   INR 1.07 04/12/2013 0320     Intake/Output Summary (Last 24 hours) at 04/12/13 0855 Last data filed at 04/12/13 0600  Gross per 24 hour  Intake 5665.42 ml  Output   1055 ml  Net 4610.42 ml     Assessment:  73 y.o. female is s/p:  #1 bilateral open femoral artery exposure  #2 insertion of an aorto by common iliac Gore-Excluder-C3 stent graft using  A 31 x 14 x 13 cm main body via right  B 12 mm x 12 cm contralateral limb-left  C 12 mm x 7 cm iliac extender on the right  1 Day Post-Op  Plan: -soft BP this am-will give 1L bolus-ok  with systolic around [id number] -DVT prophylaxis:  Lovenox -BUN/Cr normal this am -acute surgical blood loss anemia-tolerating -nausea this am -increase mobilization -has not voided since foley removed. -keep in stepdown today. -coumadin restarted last pm.  INR today is 1.07   Leontine Locket, PA-C [facility] [phone] 04/12/2013 8:55 AM    Agree with above assessment Abdomen is soft nontender with no pulsatile mass Inguinal wounds healing well with 3+ femoral and 2+ Damita Lack is pedis pulses palpable bilaterally Blood pressure in the 90 systolic range Tolerating clear liquids presently  Will advance diet and mobilized today Will also give fluid bolus to keep systolic blood pressure around 100 or greater Plan DC home in a.m.

## 2013-04-13 LAB — TYPE AND SCREEN
ABO/RH(D): A POS
Antibody Screen: NEGATIVE
UNIT DIVISION: 0
Unit division: 0

## 2013-04-13 LAB — PROTIME-INR
INR: 1.63 — ABNORMAL HIGH (ref 0.00–1.49)
Prothrombin Time: 18.9 seconds — ABNORMAL HIGH (ref 11.6–15.2)

## 2013-04-13 MED ORDER — BISOPROLOL-HYDROCHLOROTHIAZIDE 5-6.25 MG PO TABS: 1.0000 | ORAL_TABLET | Freq: Every day | ORAL | Status: DC

## 2013-04-13 MED ORDER — BENAZEPRIL HCL 20 MG PO TABS: 10.0000 mg | ORAL_TABLET | Freq: Every day | ORAL | Status: DC

## 2013-04-13 NOTE — Plan of Care (Signed)
Problem: Phase I Progression Outcomes Goal: Voiding after catheter removal Outcome: Not Progressing Increased frequency, not emptying bladder with each void

## 2013-04-13 NOTE — Progress Notes (Addendum)
[facility] Progress Note  04/13/2013 7:31 AM 2 Days Post-Op  Subjective:  "I'm feeling better this morning"  She did have urinary retention overnight and has required I&O cath x 3.  Afebrile HR [id number]  [id number] systolic  [id number] RA  Filed Vitals:   04/13/13 0700  BP: 109/49  Pulse: 81  Temp:   Resp: 20    Physical Exam: Incisions:  Right groin with small amount of bloody drainage.  There is slightly more ecchymosis over the pubis area.  Left groin is c/d/i Extremities:  Bilateral feet are warm.  + palpable left DP.  Right pedal pulses are not palpable. Abdomen:  Soft NT/ND; -N/V  CBC    Component Value Date/Time   WBC 8.8 04/12/2013 2041   RBC 3.63* 04/12/2013 2041   HGB 11.3* 04/12/2013 2041   HCT 34.3* 04/12/2013 2041   PLT 140* 04/12/2013 2041   MCV 94.5 04/12/2013 2041   MCH 31.1 04/12/2013 2041   MCHC 32.9 04/12/2013 2041   RDW 14.4 04/12/2013 2041   LYMPHSABS 2.3 03/21/2013 1050   MONOABS 0.8 03/21/2013 1050   EOSABS 0.4 03/21/2013 1050   BASOSABS 0.1 03/21/2013 1050    BMET    Component Value Date/Time   NA 137 04/12/2013 0320   K 4.2 04/12/2013 0320   CL 102 04/12/2013 0320   CO2 22 04/12/2013 0320   GLUCOSE 106* 04/12/2013 0320   BUN 16 04/12/2013 0320   CREATININE 1.13* 04/12/2013 0320   CREATININE 1.24* 03/21/2013 1050   CALCIUM 8.5 04/12/2013 0320   GFRNONAA 48* 04/12/2013 0320   GFRAA 55* 04/12/2013 0320    INR    Component Value Date/Time   INR 1.63* 04/13/2013 0310     Intake/Output Summary (Last 24 hours) at 04/13/13 0731 Last data filed at 04/13/13 0700  Gross per 24 hour  Intake   4175 ml  Output   2250 ml  Net   1925 ml     Assessment:  73 y.o. female is s/p:  #1 bilateral open femoral artery exposure  #2 insertion of an aorto by common iliac Gore-Excluder-C3 stent graft using  A 31 x 14 x 13 cm main body via right  B 12 mm x 12 cm contralateral limb-left  C 12 mm x 7 cm iliac extender on the right   2 Days Post-Op  Plan: -pt BP  improved this am-will hold her antihypertensives -urinary retention overnight-required I&O cath x 3. -will continue to monitor this morning.  Hopefully will improve and discharge later today. -increase mobilization -DVT prophylaxis:  Lovenox   Leontine Locket, PA-C [facility] [phone] 04/13/2013 7:31 AM  Agree with above assessment Abdomen soft and nontender Inguinal wounds healing satisfactorily with 2+ dorsalis pedis pulse palpable bilaterally The patient is not uncomfortable and had in and out Foley catheter inserted about 2 hours previously No history of urinary retention in the past Ambulating well  Patient wants to go home Will see if she can void later today if not will get a urology consult and DC home either today or tomorrow

## 2013-04-13 NOTE — Discharge Summary (Signed)
[facility] Discharge Summary  Katie [name] 29-Jan-1941 73 y.o. female  [id number]  Admission Date: 04/11/2013  Discharge Date: 04/13/13  Physician: Mal Misty, MD  Admission Diagnosis: AAA   HPI:   This is a 73 y.o. female returns today to further discuss treatment for her abdominal aortic aneurysm which measures 5.1 x 5.0 cm and has been followed for several years. Today she had a CT angiogram of the abdomen and pelvis which I have reviewed by computer. Patient does have what appears to be a satisfactory neck distal to the renal arteries for a seal to allow insertion of an aortic stent graft. 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 She is currently asymptomatic. She's followed by Dr. Crissie Sickles who feels that she is not need further cardiac evaluation for insertion of the stent graft.  Hospital Course:  The patient was admitted to the hospital and taken to the operating room on 04/11/2013 and underwent: #1 bilateral open femoral artery exposure  #2 insertion of an aorto by common iliac Gore-Excluder-C3 stent graft using  A 31 x 14 x 13 cm main body via right  B 12 mm x 12 cm contralateral limb-left  C 12 mm x 7 cm iliac extender on the right  #3-bilateral common iliac balloon angioplasty using 10 mm x 4 cm-Mustang catheter  With completion angiography  Right femoral endarterectomy with Dacron patch angioplasty and primary closure and repair of left common femoral artery    The pt tolerated the procedure well and was transported to the PACU in good condition. Pt did have soft blood pressures post operatively and did require several fluid boluses and this did improve over time.  Her coumadin was restarted on POD 1.    By the morning of POD 2, she was feeling better, but had required I&O cath for urinary retention.  She was able to void by later  that day and was discharged home.  She was given specific instructions on taking her BP at home with parameters of restarting her BP meds.  She expressed understanding.    The remainder of the hospital course consisted of increasing mobilization and increasing intake of solids without difficulty.  CBC    Component Value Date/Time   WBC 8.8 04/12/2013 2041   RBC 3.63* 04/12/2013 2041   HGB 11.3* 04/12/2013 2041   HCT 34.3* 04/12/2013 2041   PLT 140* 04/12/2013 2041   MCV 94.5 04/12/2013 2041   MCH 31.1 04/12/2013 2041   MCHC 32.9 04/12/2013 2041   RDW 14.4 04/12/2013 2041   LYMPHSABS 2.3 03/21/2013 1050   MONOABS 0.8 03/21/2013 1050   EOSABS 0.4 03/21/2013 1050   BASOSABS 0.1 03/21/2013 1050    BMET    Component Value Date/Time   NA 137 04/12/2013 0320   K 4.2 04/12/2013 0320   CL 102 04/12/2013 0320   CO2 22 04/12/2013 0320   GLUCOSE 106* 04/12/2013 0320   BUN 16 04/12/2013 0320   CREATININE 1.13* 04/12/2013 0320   CREATININE 1.24* 03/21/2013 1050   CALCIUM 8.5 04/12/2013 0320   GFRNONAA 48* 04/12/2013 0320   GFRAA 55* 04/12/2013 0320     Discharge Instructions:   The patient is discharged to home with extensive instructions on wound care and progressive ambulation.  They are instructed not to drive or perform any heavy lifting until returning to  see the physician in his office.  Discharge Orders   Future Appointments Provider Department Dept Phone   05/11/2013 9:45 AM [name], MD [place] [facility] [phone]   11/05/2013 9:00 AM [name], MD [place] [facility] [phone]   Future Orders Complete By Expires   ABDOMINAL PROCEDURE/ANEURYSM REPAIR/AORTO-BIFEMORAL BYPASS:  Call MD for increased abdominal pain; cramping diarrhea; nausea/vomiting  As directed    Call MD for:  redness, tenderness, or signs of infection (pain, swelling, bleeding, redness, odor or green/yellow discharge around incision site)  As directed    Call MD  for:  severe or increased pain, loss or decreased feeling  in affected limb(s)  As directed    Call MD for:  temperature >100.5  As directed    Discharge instructions  As directed    Scheduling Instructions:     Continue to hold blood pressure medications (Ziac and Lotensin) until 04/16/13.   Continue to monitor your blood pressure at home.  If your systolic blood pressure (top number) is greater than 115, then you can resume these medications on Monday, 04/16/13.  If it is lower, continue to hold until systolic is greater than [id number].   Discharge wound care:  As directed    Comments:     Shower daily with soap and water starting 04/14/13   Driving Restrictions  As directed    Comments:     No driving for 2 weeks   Lifting restrictions  As directed    Comments:     No lifting for 4 weeks   Resume previous diet  As directed       Discharge Diagnosis:  AAA  Secondary Diagnosis: Patient Active Problem List   Diagnosis Date Noted  . AAA (abdominal aortic aneurysm) 04/11/2013  . AAA (abdominal aortic aneurysm) without rupture 03/13/2013  . Unspecified vitamin D deficiency 02/15/2013  . Other abnormal glucose 02/15/2013  . UTI (urinary tract infection) 01/17/2013  . Cystocele 08/09/2012  . Cervical dysplasia   . Thyroid disease   . Elevated cholesterol   . Osteopenia   . ABDOMINAL AORTIC ANEURYSM 11/12/2009  . CONGESTIVE HEART FAILURE UNSPECIFIED 11/25/2008  . CAROTID BRUIT 11/25/2008  . MIGRAINE HEADACHE 11/22/2008  . HYPERTENSION 11/22/2008  . CAD 11/22/2008  . Atrial fibrillation 11/22/2008  . BRONCHITIS, CHRONIC 11/22/2008  . COPD 11/22/2008  . GERD 11/22/2008  . BELL'S PALSY, HX OF 11/22/2008   Past Medical History  Diagnosis Date  . Atrial fibrillation   . COPD (chronic obstructive pulmonary disease)   . Bronchitis, chronic   . History of Bell's palsy   . Hypertension   . Coronary artery disease   . Migraine headache   . Atrial flutter   . Tobacco abuse   .  Osteopenia   . Cervical dysplasia   . Thyroid disease     Hypo  . Elevated cholesterol   . Unspecified vitamin D deficiency   . Family history of anesthesia complication     Hard for daughter to wake up after Anesthesia  . Shortness of breath     with exertion  . Hypothyroidism   . Cystocele     either on bladder or kidneys patient is unsure but stated physician will watch it  . Seasonal allergies   . Dizziness     If patient gets out of bed too fast  . H/O hiatal hernia   . Rosacea conjunctivitis     Right eye is worse than left eye  .  Arthritis   . Gout   . Aortic stenosis     mild AS witm mild to mod AR by 11/2012 echo       Medication List         allopurinol 300 MG tablet  Commonly known as:  ZYLOPRIM  TAKE 1 TABLET BY MOUTH DAILY     aspirin 81 MG tablet  Take 81 mg by mouth daily.     benazepril 20 MG tablet  Commonly known as:  LOTENSIN  Take 0.5 tablets (10 mg total) by mouth daily.  Start taking on:  04/16/2013     benzonatate 100 MG capsule  Commonly known as:  TESSALON  Take 1 capsule (100 mg total) by mouth 3 (three) times daily as needed for cough.     bisoprolol-hydrochlorothiazide 5-6.25 MG per tablet  Commonly known as:  ZIAC  Take 1 tablet by mouth daily.  Start taking on:  04/16/2013     DALIRESP 500 MCG Tabs tablet  Generic drug:  roflumilast  Take 500 mcg by mouth daily.     roflumilast 500 MCG Tabs tablet  Commonly known as:  DALIRESP  Take 500 mcg by mouth daily.     digoxin 0.25 MG tablet  Commonly known as:  LANOXIN  Take 0.125 mcg by mouth daily.     furosemide 80 MG tablet  Commonly known as:  LASIX  Take 40 mg by mouth daily. daily unless Blood Pressure is under 100     gabapentin 300 MG capsule  Commonly known as:  NEURONTIN  TAKE ONE CAPSULE BY MOUTH 3 TIMES A DAY     levothyroxine 50 MCG tablet  Commonly known as:  SYNTHROID, LEVOTHROID  Take 50 mcg by mouth at bedtime.     loteprednol 0.5 % ophthalmic suspension   Commonly known as:  LOTEMAX  Place 1 drop into both eyes daily.     montelukast 10 MG tablet  Commonly known as:  SINGULAIR  Take 1 tablet (10 mg total) by mouth daily as needed. For allergy     pravastatin 40 MG tablet  Commonly known as:  PRAVACHOL  Take 40 mg by mouth at bedtime.     prochlorperazine 5 MG tablet  Commonly known as:  COMPAZINE  Take 5 mg by mouth every 6 (six) hours as needed. Dizzy     warfarin 2 MG tablet  Commonly known as:  COUMADIN  Take 2-3 mg by mouth daily. 2 mg Tuesdays and Thursdays and 3 mg M,W,F,S,Sun        Roxicodone #30 No Refill  Disposition: home  Patient's condition: is Good  Follow up: 1. Dr. Kellie Simmering in 4 weeks with CTA   Leontine Locket, PA-C [facility] [phone] 04/13/2013  3:08 PM   - For VQI Registry use --- Instructions: Press F2 to tab through selections.  Delete question if not applicable.   Post-op:  Time to Extubation: [ x] In OR, [ ]  < 12 hrs, [ ]  12-24 hrs, [ ]  >=24 hrs Vasopressors Req. Post-op: No MI: no, [ ]  Troponin only, [ ]  EKG or Clinical New Arrhythmia: No CHF: No ICU Stay: 2 days in stepdown Transfusion: No  If yes, n/a units given  Complications: Resp failure: no, [ ]  Pneumonia, [ ]  Ventilator Chg in renal function: no, [ ]  Inc. Cr > 0.5, [ ]  Temp. Dialysis, [ ]  Permanent dialysis Leg ischemia: no, no Surgery needed, [ ]  Yes, Surgery needed, [ ]  Amputation Bowel ischemia:  no, [ ]  Medical Rx, [ ]  Surgical Rx Wound complication: no, [ ]  Superficial separation/infection, [ ]  Return to OR Return to OR: No  Return to OR for bleeding: No Stroke: no, [ ]  Minor, [ ]  Major  Discharge medications: Statin use:  Yes If No: [ ]  For Medical reasons, [ ]  Non-compliant, [ ]  Not-indicated ASA use:  Yes  If No: [ ]  For Medical reasons, [ ]  Non-compliant, [ ]  Not-indicated Plavix use:  No If No: [ ]  For Medical reasons, [ ]  Non-compliant, [ ]  Not-indicated Beta blocker use:  Yes If No: [ ]   For Medical reasons, [ ]  Non-compliant, [ ]  Not-indicated

## 2013-04-13 NOTE — Progress Notes (Signed)
04/13/2013 4:15 PM Discharge instructions gone over with patient and patient's daughter.  Patient verbalizes understanding.  IVs discontinued.  Patient ready for discharge. Tilda Burrow Everhart

## 2013-04-17 ENCOUNTER — Other Ambulatory Visit: Payer: Self-pay | Admitting: *Deleted

## 2013-04-17 ENCOUNTER — Ambulatory Visit (INDEPENDENT_AMBULATORY_CARE_PROVIDER_SITE_OTHER): Payer: Medicare Other | Admitting: Emergency Medicine

## 2013-04-17 ENCOUNTER — Encounter: Payer: Self-pay | Admitting: Emergency Medicine

## 2013-04-17 VITALS — BP 138/70 | HR 64 | Temp 97.8°F | Resp 18 | Ht 63.25 in | Wt 185.0 lb

## 2013-04-17 DIAGNOSIS — Z7901 Long term (current) use of anticoagulants: Secondary | ICD-10-CM

## 2013-04-17 DIAGNOSIS — I714 Abdominal aortic aneurysm, without rupture, unspecified: Secondary | ICD-10-CM

## 2013-04-17 DIAGNOSIS — R5381 Other malaise: Secondary | ICD-10-CM

## 2013-04-17 DIAGNOSIS — R0602 Shortness of breath: Secondary | ICD-10-CM

## 2013-04-17 DIAGNOSIS — E039 Hypothyroidism, unspecified: Secondary | ICD-10-CM

## 2013-04-17 DIAGNOSIS — Z48812 Encounter for surgical aftercare following surgery on the circulatory system: Secondary | ICD-10-CM

## 2013-04-17 DIAGNOSIS — R5383 Other fatigue: Principal | ICD-10-CM

## 2013-04-17 DIAGNOSIS — R319 Hematuria, unspecified: Secondary | ICD-10-CM

## 2013-04-17 LAB — CBC WITH DIFFERENTIAL/PLATELET
BASOS ABS: 0 10*3/uL (ref 0.0–0.1)
BASOS PCT: 0 % (ref 0–1)
Eosinophils Absolute: 0 10*3/uL (ref 0.0–0.7)
Eosinophils Relative: 0 % (ref 0–5)
HCT: 35.3 % — ABNORMAL LOW (ref 36.0–46.0)
HEMOGLOBIN: 11.9 g/dL — AB (ref 12.0–15.0)
Lymphocytes Relative: 11 % — ABNORMAL LOW (ref 12–46)
Lymphs Abs: 1.7 10*3/uL (ref 0.7–4.0)
MCH: 30.2 pg (ref 26.0–34.0)
MCHC: 33.7 g/dL (ref 30.0–36.0)
MCV: 89.6 fL (ref 78.0–100.0)
Monocytes Absolute: 0.9 10*3/uL (ref 0.1–1.0)
Monocytes Relative: 6 % (ref 3–12)
NEUTROS ABS: 12 10*3/uL — AB (ref 1.7–7.7)
Neutrophils Relative %: 83 % — ABNORMAL HIGH (ref 43–77)
Platelets: 397 10*3/uL (ref 150–400)
RBC: 3.94 MIL/uL (ref 3.87–5.11)
RDW: 15 % (ref 11.5–15.5)
WBC: 14.5 10*3/uL — ABNORMAL HIGH (ref 4.0–10.5)

## 2013-04-17 LAB — PROTIME-INR
INR: 1.57 — ABNORMAL HIGH (ref <1.50)
Prothrombin Time: 18.5 seconds — ABNORMAL HIGH (ref 11.6–15.2)

## 2013-04-17 MED ORDER — CIPROFLOXACIN HCL 500 MG PO TABS: 500.0000 mg | ORAL_TABLET | Freq: Two times a day (BID) | ORAL | Status: AC

## 2013-04-17 MED ORDER — PHENAZOPYRIDINE HCL 200 MG PO TABS: 200.0000 mg | ORAL_TABLET | Freq: Three times a day (TID) | ORAL | Status: DC | PRN

## 2013-04-17 NOTE — Patient Instructions (Signed)
Acute Urinary Retention Acute urinary retention is the temporary inability to urinate. This is an uncommon problem in women. It can be caused by:  Infection.  A side effect of a medicine.  A problem in a nearby organ that presses or squeezes on the bladder or the urethra (the tube that drains the bladder).  Psychological problems.   Surgery on your bladder, urethra, or pelvic organs that causes obstruction to the outflow of urine from your bladder. HOME CARE INSTRUCTIONS  If you are sent home with a Foley catheter and a drainage system, you will need to discuss the best course of action with your health care provider. While the catheter is in, maintain a good intake of fluids. Keep the drainage bag emptied and lower than your catheter. This is so that contaminated urine will not flow back into your bladder, which could lead to a urinary tract infection. There are two main types of drainage bags. One is a large bag that usually is used at night. It has a good capacity that will allow you to sleep through the night without having to empty it. The second type is called a leg bag. It has a smaller capacity so it needs to be emptied more frequently. However, the main advantage is that it can be attached by a leg strap and goes underneath your clothing, allowing you the freedom to move about or leave your home. Only take over-the-counter or prescription medicines for pain, discomfort, or fever as directed by your health care provider.  SEEK MEDICAL CARE IF:  You develop a low-grade fever.  You experience spasms or leakage of urine with the spasms. SEEK IMMEDIATE MEDICAL CARE IF:   You develop chills or fever.  Your catheter stops draining urine.  Your catheter falls out.  You start to develop increased bleeding that does not respond to rest and increased fluid intake. MAKE SURE YOU:  Understand these instructions.  Will watch your condition.  Will get help right away if you are not  doing well or get worse. Document Released: 02/28/2006 Document Revised: 12/20/2012 Document Reviewed: 08/10/2012 [facility] Patient Information 2014 [place]. Heart Failure Heart failure means your heart has trouble pumping blood. This makes it hard for your body to work well. Heart failure is usually a long-term (chronic) condition. You must take good care of yourself and follow your doctor's treatment plan. HOME CARE  Take your heart medicine as told by your doctor.  Do not stop taking medicine unless your doctor tells you to.  Do not skip any dose of medicine.  Refill your medicines before they run out.  Take other medicines only as told by your doctor or pharmacist.  Stay active if told by your doctor. The elderly and people with severe heart failure should talk with a doctor about physical activity.  Eat heart healthy foods. Choose foods that are without trans fat and are low in saturated fat, cholesterol, and salt (sodium). This includes fresh or frozen fruits and vegetables, fish, lean meats, fat-free or low-fat dairy foods, whole grains, and high-fiber foods. Lentils and dried peas and beans (legumes) are also good choices.  Limit salt if told by your doctor.  Cook in a healthy way. Roast, grill, broil, bake, poach, steam, or stir-fry foods.  Limit fluids as told by your doctor.  Weigh yourself every morning. Do this after you pee (urinate) and before you eat breakfast. Write down your weight to give to your doctor.  Take your blood pressure and  write it down if your doctor tell you to.  Ask your doctor how to check your pulse. Check your pulse as told.  Lose weight if told by your doctor.  Stop smoking or chewing tobacco. Do not use gum or patches that help you quit without your doctor's approval.  Schedule and go to doctor visits as told.  Nonpregnant women should have no more than 1 drink a day. Men should have no more than 2 drinks a day. Talk to your doctor  about drinking alcohol.  Stop illegal drug use.  Stay current with shots (immunizations).  Manage your health conditions as told by your doctor.  Learn to manage your stress.  Rest when you are tired.  If it is really hot outside:  Avoid intense activities.  Use air conditioning or fans, or get in a cooler place.  Avoid caffeine and alcohol.  Wear loose-fitting, lightweight, and light-colored clothing.  If it is really cold outside:  Avoid intense activities.  Layer your clothing.  Wear mittens or gloves, a hat, and a scarf when going outside.  Avoid alcohol.  Learn about heart failure and get support as needed.  Get help to maintain or improve your quality of life and your ability to care for yourself as needed. GET HELP IF:   You gain 03 lb/1.4 kg or more in 1 day or 05 lb/2.3 kg in a week.  You are more short of breath than usual.  You cannot do your normal activities.  You tire easily.  You cough more than normal, especially with activity.  You have any or more puffiness (swelling) in areas such as your hands, feet, ankles, or belly (abdomen).  You cannot sleep because it is hard to breathe.  You feel like your heart is beating fast (palpitations).  You get dizzy or lightheaded when you stand up. GET HELP RIGHT AWAY IF:   You have trouble breathing.  There is a change in mental status, such as becoming less alert or not being able to focus.  You have chest pain or discomfort.  You faint. MAKE SURE YOU:   Understand these instructions.  Will watch your condition.  Will get help right away if you are not doing well or get worse. Document Released: 12/09/2007 Document Revised: 06/26/2012 Document Reviewed: 09/30/2011 [facility] Patient Information 2014 [place], [place].

## 2013-04-17 NOTE — Progress Notes (Signed)
Called today and spoke with pt's daughter Ms. Wyrick.  She states that her mother went home yesterday from her house.  Her BP had stabilized and she was able to restart her home BP meds.  She was concerned about blood in her urine and that she was at her PCP at that time and was waiting on an update from her mother.   Given that she is on coumadin, I feel sure they will check her INR at the doctor visit.  She was restarted on her regular dose of coumadin at discharge.  Leontine Locket 04/17/2013 2:17 PM

## 2013-04-17 NOTE — Progress Notes (Signed)
Subjective:    Patient ID: Katie [name], female    DOB: 04/05/1940, 73 y.o.   MRN: [id number]  HPI Comments: 73 yo female had surgery recently with difficulty urinating after cath removed. She has noticed blood in urine with painful urination x 1 day. She has history of cystocele in past. She has noticed increased fatigue since surgery.  She has increased edema over last week or so and has increased Lasix and has helped to get wt down. She is up 5 # since last OV. She has mild SOB.   She started Prednisone on Sunday which has helped with ringing in ears.  Dysuria  Associated symptoms include hematuria.  Hematuria Associated symptoms include dysuria.   Current Outpatient Prescriptions on File Prior to Visit  Medication Sig Dispense Refill  . allopurinol (ZYLOPRIM) 300 MG tablet TAKE 1 TABLET BY MOUTH DAILY  90 tablet  1  . aspirin 81 MG tablet Take 81 mg by mouth daily.        . benazepril (LOTENSIN) 20 MG tablet Take 0.5 tablets (10 mg total) by mouth daily.      . benzonatate (TESSALON) 100 MG capsule Take 1 capsule (100 mg total) by mouth 3 (three) times daily as needed for cough.  20 capsule  0  . bisoprolol-hydrochlorothiazide (ZIAC) 5-6.25 MG per tablet Take 1 tablet by mouth daily.      . digoxin (LANOXIN) 0.25 MG tablet Take 0.125 mcg by mouth daily.       . furosemide (LASIX) 80 MG tablet Take 40 mg by mouth daily. daily unless Blood Pressure is under 100      . gabapentin (NEURONTIN) 300 MG capsule TAKE ONE CAPSULE BY MOUTH 3 TIMES A DAY  270 capsule  3  . levothyroxine (SYNTHROID, LEVOTHROID) 50 MCG tablet Take 50 mcg by mouth at bedtime.       Marland Kitchen loteprednol (LOTEMAX) 0.5 % ophthalmic suspension Place 1 drop into both eyes daily.      . montelukast (SINGULAIR) 10 MG tablet Take 1 tablet (10 mg total) by mouth daily as needed. For allergy  90 tablet  3  . pravastatin (PRAVACHOL) 40 MG tablet Take 40 mg by mouth at bedtime.        . prochlorperazine (COMPAZINE) 5 MG tablet  Take 5 mg by mouth every 6 (six) hours as needed. Dizzy      . roflumilast (DALIRESP) 500 MCG TABS tablet Take 500 mcg by mouth daily.      . roflumilast (DALIRESP) 500 MCG TABS tablet Take 500 mcg by mouth daily.      Marland Kitchen warfarin (COUMADIN) 2 MG tablet Take 2-3 mg by mouth daily. 2 mg Tuesdays and Thursdays and 3 mg M,W,F,S,Sun       No current facility-administered medications on file prior to visit.   ALLERGIES Advair diskus; Amiodarone; Codeine; Diovan; Doxycycline; Flexeril; Keflex; and Verapamil  Past Medical History  Diagnosis Date  . Atrial fibrillation   . COPD (chronic obstructive pulmonary disease)   . Bronchitis, chronic   . History of Bell's palsy   . Hypertension   . Coronary artery disease   . Migraine headache   . Atrial flutter   . Tobacco abuse   . Osteopenia   . Cervical dysplasia   . Thyroid disease     Hypo  . Elevated cholesterol   . Unspecified vitamin D deficiency   . Family history of anesthesia complication     Hard for daughter to wake  up after Anesthesia  . Shortness of breath     with exertion  . Hypothyroidism   . Cystocele     either on bladder or kidneys patient is unsure but stated physician will watch it  . Seasonal allergies   . Dizziness     If patient gets out of bed too fast  . H/O hiatal hernia   . Rosacea conjunctivitis     Right eye is worse than left eye  . Arthritis   . Gout   . Aortic stenosis     mild AS witm mild to mod AR by 11/2012 echo      Review of Systems  Constitutional: Positive for fatigue.  HENT: Positive for tinnitus.   Cardiovascular: Positive for leg swelling.  Genitourinary: Positive for dysuria and hematuria.  All other systems reviewed and are negative.   BP 138/70  Pulse 64  Temp(Src) 97.8 F (36.6 C) (Temporal)  Resp 18  Ht 5' 3.25" (1.607 m)  Wt 185 lb (83.915 kg)  BMI 32.49 kg/m2     Objective:   Physical Exam  Nursing note and vitals reviewed. Constitutional: She is oriented to  person, place, and time. She appears well-developed and well-nourished. No distress.  HENT:  Head: Normocephalic and atraumatic.  Right Ear: External ear normal.  Left Ear: External ear normal.  Nose: Nose normal.  Mouth/Throat: Oropharynx is clear and moist.  Eyes: Conjunctivae and EOM are normal.  Neck: Normal range of motion. Neck supple. No JVD present. No thyromegaly present.  Cardiovascular: Normal rate, regular rhythm, normal heart sounds and intact distal pulses.   1 + LE bilateral pitting edema  Pulmonary/Chest: Effort normal and breath sounds normal.  Abdominal: Soft. Bowel sounds are normal. She exhibits distension. She exhibits no mass. There is tenderness. There is no rebound and no guarding.  Minimal low abdomen  Musculoskeletal: Normal range of motion. She exhibits no edema and no tenderness.  Lymphadenopathy:    She has no cervical adenopathy.  Neurological: She is alert and oriented to person, place, and time. No cranial nerve deficit.  Skin: Skin is warm and dry. No rash noted. No erythema. No pallor.  Psychiatric: She has a normal mood and affect. Her behavior is normal. Judgment and thought content normal.          Assessment & Plan:  1. Edema- Add 1/2 of lasix in afternoon, check labs, w/c if SX increase or ER. Elevate legs, wear compression stockings. 2. Fatigue- check labs, increase activity and H2O 3. Hematuria- check labs with recent catheterizations 4. Coumadin recheck with Prednisone/ ABX

## 2013-04-18 ENCOUNTER — Other Ambulatory Visit: Payer: Self-pay

## 2013-04-18 ENCOUNTER — Telehealth: Payer: Self-pay | Admitting: Vascular Surgery

## 2013-04-18 DIAGNOSIS — I714 Abdominal aortic aneurysm, without rupture, unspecified: Secondary | ICD-10-CM

## 2013-04-18 LAB — URINALYSIS, ROUTINE W REFLEX MICROSCOPIC
Bilirubin Urine: NEGATIVE
GLUCOSE, UA: NEGATIVE mg/dL
KETONES UR: NEGATIVE mg/dL
Nitrite: NEGATIVE
PH: 6.5 (ref 5.0–8.0)
Protein, ur: NEGATIVE mg/dL
Specific Gravity, Urine: <1.005 — ABNORMAL LOW (ref 1.005–1.030)
Urobilinogen, UA: 0.2 mg/dL (ref 0.0–1.0)

## 2013-04-18 LAB — URINALYSIS, MICROSCOPIC ONLY
Casts: NONE SEEN
Crystals: NONE SEEN

## 2013-04-18 LAB — HEPATIC FUNCTION PANEL
ALBUMIN: 3.8 g/dL (ref 3.5–5.2)
ALT: 11 U/L (ref 0–35)
AST: 16 U/L (ref 0–37)
Alkaline Phosphatase: 34 U/L — ABNORMAL LOW (ref 39–117)
BILIRUBIN INDIRECT: 0.3 mg/dL (ref 0.2–1.2)
Bilirubin, Direct: 0.1 mg/dL (ref 0.0–0.3)
TOTAL PROTEIN: 6.3 g/dL (ref 6.0–8.3)
Total Bilirubin: 0.4 mg/dL (ref 0.2–1.2)

## 2013-04-18 LAB — BASIC METABOLIC PANEL WITH GFR
BUN: 17 mg/dL (ref 6–23)
CO2: 31 mEq/L (ref 19–32)
CREATININE: 1.03 mg/dL (ref 0.50–1.10)
Calcium: 10.1 mg/dL (ref 8.4–10.5)
Chloride: 99 mEq/L (ref 96–112)
GFR, EST NON AFRICAN AMERICAN: 55 mL/min — AB
GFR, Est African American: 63 mL/min
Glucose, Bld: 96 mg/dL (ref 70–99)
Potassium: 4.2 mEq/L (ref 3.5–5.3)
Sodium: 143 mEq/L (ref 135–145)

## 2013-04-18 LAB — TSH: TSH: 2.089 u[IU]/mL (ref 0.350–4.500)

## 2013-04-18 LAB — BRAIN NATRIURETIC PEPTIDE: Brain Natriuretic Peptide: 428.3 pg/mL — ABNORMAL HIGH (ref 0.0–100.0)

## 2013-04-18 NOTE — Telephone Encounter (Addendum)
Message copied by Doristine Section on Wed Apr 18, 2013  2:40 PM ------      Message from: Michaele Offer S      Created: Wed Apr 18, 2013 10:22 AM      Regarding: F/U CTA/ OFFICE APPT      Contact: [phone]       She needs a 4 week f/u : CTA Abd/ pelvis w/ contrast & JDL appt.; s/p EVAR of AAA on 04/11/13.  I will place the order.  Thanks.   ------  Notified patient of fu appt. on 05-22-13 cta 12:15 and jdl at 1:30

## 2013-04-19 ENCOUNTER — Other Ambulatory Visit: Payer: Self-pay | Admitting: Emergency Medicine

## 2013-04-20 LAB — URINE CULTURE

## 2013-04-23 ENCOUNTER — Ambulatory Visit (INDEPENDENT_AMBULATORY_CARE_PROVIDER_SITE_OTHER): Payer: Medicare Other | Admitting: Internal Medicine

## 2013-04-23 ENCOUNTER — Encounter: Payer: Self-pay | Admitting: Internal Medicine

## 2013-04-23 VITALS — BP 102/62 | HR 72 | Temp 97.9°F | Resp 16 | Wt 174.8 lb

## 2013-04-23 DIAGNOSIS — B3731 Acute candidiasis of vulva and vagina: Secondary | ICD-10-CM

## 2013-04-23 DIAGNOSIS — R7309 Other abnormal glucose: Secondary | ICD-10-CM

## 2013-04-23 DIAGNOSIS — N3000 Acute cystitis without hematuria: Secondary | ICD-10-CM

## 2013-04-23 DIAGNOSIS — I10 Essential (primary) hypertension: Secondary | ICD-10-CM

## 2013-04-23 DIAGNOSIS — Z7901 Long term (current) use of anticoagulants: Secondary | ICD-10-CM

## 2013-04-23 DIAGNOSIS — Z79899 Other long term (current) drug therapy: Secondary | ICD-10-CM

## 2013-04-23 DIAGNOSIS — B373 Candidiasis of vulva and vagina: Secondary | ICD-10-CM

## 2013-04-23 DIAGNOSIS — E559 Vitamin D deficiency, unspecified: Secondary | ICD-10-CM

## 2013-04-23 DIAGNOSIS — E7800 Pure hypercholesterolemia, unspecified: Secondary | ICD-10-CM

## 2013-04-23 LAB — BASIC METABOLIC PANEL WITH GFR
BUN: 36 mg/dL — AB (ref 6–23)
CO2: 28 mEq/L (ref 19–32)
Calcium: 8.8 mg/dL (ref 8.4–10.5)
Chloride: 96 mEq/L (ref 96–112)
Creat: 1.27 mg/dL — ABNORMAL HIGH (ref 0.50–1.10)
GFR, EST AFRICAN AMERICAN: 49 mL/min — AB
GFR, EST NON AFRICAN AMERICAN: 43 mL/min — AB
Glucose, Bld: 84 mg/dL (ref 70–99)
Potassium: 4.5 mEq/L (ref 3.5–5.3)
Sodium: 135 mEq/L (ref 135–145)

## 2013-04-23 LAB — CBC WITH DIFFERENTIAL/PLATELET
BASOS ABS: 0 10*3/uL (ref 0.0–0.1)
BASOS PCT: 0 % (ref 0–1)
EOS ABS: 0 10*3/uL (ref 0.0–0.7)
EOS PCT: 0 % (ref 0–5)
HCT: 43.3 % (ref 36.0–46.0)
Hemoglobin: 14.9 g/dL (ref 12.0–15.0)
Lymphocytes Relative: 13 % (ref 12–46)
Lymphs Abs: 2.2 10*3/uL (ref 0.7–4.0)
MCH: 31.4 pg (ref 26.0–34.0)
MCHC: 34.4 g/dL (ref 30.0–36.0)
MCV: 91.2 fL (ref 78.0–100.0)
MONO ABS: 0.9 10*3/uL (ref 0.1–1.0)
Monocytes Relative: 5 % (ref 3–12)
NEUTROS ABS: 14.2 10*3/uL — AB (ref 1.7–7.7)
Neutrophils Relative %: 82 % — ABNORMAL HIGH (ref 43–77)
Platelets: 531 10*3/uL — ABNORMAL HIGH (ref 150–400)
RBC: 4.75 MIL/uL (ref 3.87–5.11)
RDW: 14.6 % (ref 11.5–15.5)
WBC: 17.3 10*3/uL — ABNORMAL HIGH (ref 4.0–10.5)

## 2013-04-23 LAB — MAGNESIUM: Magnesium: 2.3 mg/dL (ref 1.5–2.5)

## 2013-04-23 LAB — PROTIME-INR
INR: 2.43 — AB (ref <1.50)
Prothrombin Time: 25.8 seconds — ABNORMAL HIGH (ref 11.6–15.2)

## 2013-04-23 MED ORDER — LEVOFLOXACIN 500 MG PO TABS: 500.0000 mg | ORAL_TABLET | Freq: Every day | ORAL | Status: AC

## 2013-04-23 MED ORDER — FLUCONAZOLE 150 MG PO TABS: ORAL_TABLET | ORAL | Status: DC

## 2013-04-23 NOTE — Progress Notes (Signed)
Subjective:    Patient ID: Katie [name], female    DOB: Jan 21, 1941, 73 y.o.   MRN: [id number]  Patient underwent stenting of an infrarenal AAA.  Urinary Tract Infection  This is a recurrent problem. The current episode started in the past 7 days. The problem occurs intermittently. The problem has been waxing and waning. The quality of the pain is described as aching and burning. The pain is mild. There has been no fever. She is not sexually active. There is no history of pyelonephritis. Associated symptoms include frequency, hematuria and urgency. Pertinent negatives include no chills, discharge, flank pain, hesitancy, nausea, sweats or vomiting. She has tried antibiotics for the symptoms. The treatment provided mild relief. Her past medical history is significant for recurrent UTIs.  She recently had hematuria and she was recently treated for culture positive Proteus & sensitive to Cipro which she is completing. She persists with Sx's above.    Medication List       This list is accurate as of: 04/23/13  8:48 PM.  Always use your most recent med list.               allopurinol 300 MG tablet  Commonly known as:  ZYLOPRIM  TAKE 1 TABLET BY MOUTH DAILY     aspirin 81 MG tablet  Take 81 mg by mouth daily.     benazepril 20 MG tablet  Commonly known as:  LOTENSIN  Take 0.5 tablets (10 mg total) by mouth daily.     benzonatate 100 MG capsule  Commonly known as:  TESSALON  Take 1 capsule (100 mg total) by mouth 3 (three) times daily as needed for cough.     bisoprolol-hydrochlorothiazide 5-6.25 MG per tablet  Commonly known as:  ZIAC  Take 1 tablet by mouth daily.     DALIRESP 500 MCG Tabs tablet  Generic drug:  roflumilast  Take 500 mcg by mouth daily.     roflumilast 500 MCG Tabs tablet  Commonly known as:  DALIRESP  Take 500 mcg by mouth daily.     digoxin 0.25 MG tablet  Commonly known as:  LANOXIN  Take 0.125 mcg by mouth daily.     fluconazole 150 MG tablet   Commonly known as:  DIFLUCAN  Take 1 tablet weekly for yeast infection     furosemide 80 MG tablet  Commonly known as:  LASIX  Take 40 mg by mouth daily. daily unless Blood Pressure is under 100     gabapentin 300 MG capsule  Commonly known as:  NEURONTIN  TAKE ONE CAPSULE BY MOUTH 3 TIMES A DAY     levofloxacin 500 MG tablet  Commonly known as:  LEVAQUIN  Take 1 tablet (500 mg total) by mouth daily.     levothyroxine 50 MCG tablet  Commonly known as:  SYNTHROID, LEVOTHROID  Take 50 mcg by mouth at bedtime.     loteprednol 0.5 % ophthalmic suspension  Commonly known as:  LOTEMAX  Place 1 drop into both eyes daily.     montelukast 10 MG tablet  Commonly known as:  SINGULAIR  Take 1 tablet (10 mg total) by mouth daily as needed. For allergy     phenazopyridine 200 MG tablet  Commonly known as:  PYRIDIUM  Take 1 tablet (200 mg total) by mouth 3 (three) times daily as needed for pain.     pravastatin 40 MG tablet  Commonly known as:  PRAVACHOL  Take 40 mg by mouth at  bedtime.     predniSONE 20 MG tablet  Commonly known as:  DELTASONE  Take 20 mg by mouth daily with breakfast.     prochlorperazine 5 MG tablet  Commonly known as:  COMPAZINE  Take 5 mg by mouth every 6 (six) hours as needed. Dizzy     warfarin 2 MG tablet  Commonly known as:  COUMADIN  Take 2-3 mg by mouth daily. 2 mg Tuesdays and Thursdays and 3 mg M,W,F,S,Sun        Review of Systems  Constitutional: Negative.  Negative for chills.  HENT: Negative.   Eyes: Negative.   Respiratory: Negative.   Cardiovascular: Negative.   Gastrointestinal: Negative.  Negative for nausea and vomiting.  Genitourinary: Positive for dysuria, urgency, frequency, hematuria and difficulty urinating. Negative for hesitancy and flank pain.  Neurological: Negative.     Objective:   Physical Exam  Constitutional: She is oriented to person, place, and time. She appears well-developed and well-nourished. She appears  distressed.  HENT:  Head: Normocephalic.  Right Ear: External ear normal.  Left Ear: External ear normal.  Eyes: Conjunctivae and EOM are normal. Pupils are equal, round, and reactive to light.  Neck: Normal range of motion. No JVD present. No thyromegaly present.  Cardiovascular: Normal rate and regular rhythm.  Exam reveals no gallop.   Murmur heard. Pulmonary/Chest: Effort normal and breath sounds normal. No respiratory distress. She has no wheezes. She has no rales.  Abdominal: Soft. There is tenderness. There is no rebound and no guarding.  Musculoskeletal: Normal range of motion. She exhibits no edema and no tenderness.  Lymphadenopathy:    She has no cervical adenopathy.  Neurological: She is alert and oriented to person, place, and time. She has normal reflexes. No cranial nerve deficit.  Skin: Skin is warm and dry. No rash noted. She is not diaphoretic. No erythema. No pallor.  Psychiatric: She has a normal mood and affect.   Assessment & Plan:   1. HYPERTENSION 2. Elevated cholesterol 3. Unspecified vitamin D deficiency 4. PreDiabetes 5. Long term (current) use of anticoagulants - Protime-INR 6. Encounter for long-term (current) use of other medications - BASIC METABOLIC PANEL WITH GFR - CBC with Differential 7. Acute cystitis -  Continue treatment with levofloxacin 500 MG tablet; Take 1 tablet (500 mg) by mouth daily.  Dispense: 10 tablet 8. Vulvovaginal candidiasis - fluconazole 150 MG tablet; Take 1 tablet weekly for yeast infection  Dispense: 4 tablet; Refill: 3 9. ASHD/ Afib - Magnesium - Digoxin level

## 2013-04-23 NOTE — Patient Instructions (Signed)
Urinary Tract Infection  Urinary tract infections (UTIs) can develop anywhere along your urinary tract. Your urinary tract is your body's drainage system for removing wastes and extra water. Your urinary tract includes two kidneys, two ureters, a bladder, and a urethra. Your kidneys are a pair of bean-shaped organs. Each kidney is about the size of your fist. They are located below your ribs, one on each side of your spine.  CAUSES  Infections are caused by microbes, which are microscopic organisms, including fungi, viruses, and bacteria. These organisms are so small that they can only be seen through a microscope. Bacteria are the microbes that most commonly cause UTIs.  SYMPTOMS   Symptoms of UTIs may vary by age and gender of the patient and by the location of the infection. Symptoms in young women typically include a frequent and intense urge to urinate and a painful, burning feeling in the bladder or urethra during urination. Older women and men are more likely to be tired, shaky, and weak and have muscle aches and abdominal pain. A fever may mean the infection is in your kidneys. Other symptoms of a kidney infection include pain in your back or sides below the ribs, nausea, and vomiting.  DIAGNOSIS  To diagnose a UTI, your caregiver will ask you about your symptoms. Your caregiver also will ask to provide a urine sample. The urine sample will be tested for bacteria and white blood cells. White blood cells are made by your body to help fight infection.  TREATMENT   Typically, UTIs can be treated with medication. Because most UTIs are caused by a bacterial infection, they usually can be treated with the use of antibiotics. The choice of antibiotic and length of treatment depend on your symptoms and the type of bacteria causing your infection.  HOME CARE INSTRUCTIONS   If you were prescribed antibiotics, take them exactly as your caregiver instructs you. Finish the medication even if you feel better after you  have only taken some of the medication.   Drink enough water and fluids to keep your urine clear or pale yellow.   Avoid caffeine, tea, and carbonated beverages. They tend to irritate your bladder.   Empty your bladder often. Avoid holding urine for long periods of time.   Empty your bladder before and after sexual intercourse.   After a bowel movement, women should cleanse from front to back. Use each tissue only once.  SEEK MEDICAL CARE IF:    You have back pain.   You develop a fever.   Your symptoms do not begin to resolve within 3 days.  SEEK IMMEDIATE MEDICAL CARE IF:    You have severe back pain or lower abdominal pain.   You develop chills.   You have nausea or vomiting.   You have continued burning or discomfort with urination.  MAKE SURE YOU:    Understand these instructions.   Will watch your condition.   Will get help right away if you are not doing well or get worse.  Document Released: 12/09/2004 Document Revised: 08/31/2011 Document Reviewed: 04/09/2011  [employer] Patient Information 2014 [employer]

## 2013-04-24 LAB — DIGOXIN LEVEL: Digoxin Level: 0.9 ng/mL (ref 0.8–2.0)

## 2013-04-25 ENCOUNTER — Ambulatory Visit: Payer: Self-pay | Admitting: Emergency Medicine

## 2013-05-04 ENCOUNTER — Ambulatory Visit (INDEPENDENT_AMBULATORY_CARE_PROVIDER_SITE_OTHER): Payer: Medicare Other | Admitting: Emergency Medicine

## 2013-05-04 ENCOUNTER — Encounter: Payer: Self-pay | Admitting: Emergency Medicine

## 2013-05-04 VITALS — BP 106/60 | HR 68 | Temp 98.0°F | Resp 18 | Ht 63.25 in | Wt 175.0 lb

## 2013-05-04 DIAGNOSIS — Z7901 Long term (current) use of anticoagulants: Secondary | ICD-10-CM

## 2013-05-04 DIAGNOSIS — R5381 Other malaise: Secondary | ICD-10-CM

## 2013-05-04 DIAGNOSIS — N390 Urinary tract infection, site not specified: Secondary | ICD-10-CM

## 2013-05-04 DIAGNOSIS — R0602 Shortness of breath: Secondary | ICD-10-CM

## 2013-05-04 DIAGNOSIS — R5383 Other fatigue: Secondary | ICD-10-CM

## 2013-05-04 DIAGNOSIS — J329 Chronic sinusitis, unspecified: Secondary | ICD-10-CM

## 2013-05-04 LAB — URINALYSIS, ROUTINE W REFLEX MICROSCOPIC
BILIRUBIN URINE: NEGATIVE
Glucose, UA: NEGATIVE mg/dL
Hgb urine dipstick: NEGATIVE
Ketones, ur: NEGATIVE mg/dL
Leukocytes, UA: NEGATIVE
NITRITE: NEGATIVE
PROTEIN: NEGATIVE mg/dL
SPECIFIC GRAVITY, URINE: 1.007 (ref 1.005–1.030)
UROBILINOGEN UA: 0.2 mg/dL (ref 0.0–1.0)
pH: 6.5 (ref 5.0–8.0)

## 2013-05-04 LAB — CBC WITH DIFFERENTIAL/PLATELET
BASOS PCT: 0 % (ref 0–1)
Basophils Absolute: 0 10*3/uL (ref 0.0–0.1)
EOS ABS: 0.2 10*3/uL (ref 0.0–0.7)
Eosinophils Relative: 2 % (ref 0–5)
HEMATOCRIT: 39 % (ref 36.0–46.0)
HEMOGLOBIN: 13.2 g/dL (ref 12.0–15.0)
LYMPHS ABS: 1.8 10*3/uL (ref 0.7–4.0)
Lymphocytes Relative: 21 % (ref 12–46)
MCH: 30.5 pg (ref 26.0–34.0)
MCHC: 33.8 g/dL (ref 30.0–36.0)
MCV: 90.1 fL (ref 78.0–100.0)
MONO ABS: 0.9 10*3/uL (ref 0.1–1.0)
MONOS PCT: 11 % (ref 3–12)
Neutro Abs: 5.5 10*3/uL (ref 1.7–7.7)
Neutrophils Relative %: 66 % (ref 43–77)
Platelets: 324 10*3/uL (ref 150–400)
RBC: 4.33 MIL/uL (ref 3.87–5.11)
RDW: 14.9 % (ref 11.5–15.5)
WBC: 8.4 10*3/uL (ref 4.0–10.5)

## 2013-05-04 LAB — BASIC METABOLIC PANEL WITH GFR
BUN: 18 mg/dL (ref 6–23)
CHLORIDE: 100 meq/L (ref 96–112)
CO2: 30 meq/L (ref 19–32)
Calcium: 9.7 mg/dL (ref 8.4–10.5)
Creat: 1.09 mg/dL (ref 0.50–1.10)
GFR, Est African American: 59 mL/min — ABNORMAL LOW
GFR, Est Non African American: 51 mL/min — ABNORMAL LOW
Glucose, Bld: 93 mg/dL (ref 70–99)
Potassium: 5.1 mEq/L (ref 3.5–5.3)
Sodium: 140 mEq/L (ref 135–145)

## 2013-05-04 LAB — BRAIN NATRIURETIC PEPTIDE: Brain Natriuretic Peptide: 152.3 pg/mL — ABNORMAL HIGH (ref 0.0–100.0)

## 2013-05-04 LAB — PROTIME-INR
INR: 2.41 — AB (ref <1.50)
PROTHROMBIN TIME: 25.6 s — AB (ref 11.6–15.2)

## 2013-05-04 MED ORDER — AZITHROMYCIN 250 MG PO TABS: ORAL_TABLET | ORAL | Status: DC

## 2013-05-04 NOTE — Progress Notes (Signed)
Subjective:    Patient ID: Katie [name], female    DOB: [birth year], 73 y.o.   MRN: [id number]  HPI Comments: 73 yo female with recurrent sinusitis. She was recently on Levaquin 04/23/13. She has been having yellow production x 3 days. She has felt like facial swelling in cheeks and chills +. She is slowly regaining strength from surgery. She is on her Singulair and nasal saline without relief. Cough dry.  She has been having nose bleeds x 2 days and is on coumadin. She notes mild improvement with SOB and fatigue but both are still present. She did have mild elevated BNP at last OV. She did increase Lasix AD. She denies any other symptoms except low abdomen pressure.  Sinusitis Associated symptoms include congestion, coughing, headaches and shortness of breath.  Headache  Associated symptoms include abdominal pain and coughing.   Current Outpatient Prescriptions on File Prior to Visit  Medication Sig Dispense Refill  . allopurinol (ZYLOPRIM) 300 MG tablet TAKE 1 TABLET BY MOUTH DAILY  90 tablet  1  . aspirin 81 MG tablet Take 81 mg by mouth daily.        . benazepril (LOTENSIN) 20 MG tablet Take 0.5 tablets (10 mg total) by mouth daily.      . bisoprolol-hydrochlorothiazide (ZIAC) 5-6.25 MG per tablet Take 1 tablet by mouth daily.      . digoxin (LANOXIN) 0.25 MG tablet Take 0.125 mcg by mouth daily.       . fluconazole (DIFLUCAN) 150 MG tablet Take 1 tablet weekly for yeast infection  4 tablet  3  . furosemide (LASIX) 80 MG tablet Take 40 mg by mouth daily. daily unless Blood Pressure is under 100      . gabapentin (NEURONTIN) 300 MG capsule TAKE ONE CAPSULE BY MOUTH 3 TIMES A DAY  270 capsule  3  . levothyroxine (SYNTHROID, LEVOTHROID) 50 MCG tablet Take 50 mcg by mouth at bedtime.       Marland Kitchen loteprednol (LOTEMAX) 0.5 % ophthalmic suspension Place 1 drop into both eyes daily.      . montelukast (SINGULAIR) 10 MG tablet Take 1 tablet (10 mg total) by mouth daily as needed. For allergy  90  tablet  3  . phenazopyridine (PYRIDIUM) 200 MG tablet Take 1 tablet (200 mg total) by mouth 3 (three) times daily as needed for pain.  6 tablet  0  . pravastatin (PRAVACHOL) 40 MG tablet Take 40 mg by mouth at bedtime.        . prochlorperazine (COMPAZINE) 5 MG tablet Take 5 mg by mouth every 6 (six) hours as needed. Dizzy      . roflumilast (DALIRESP) 500 MCG TABS tablet Take 500 mcg by mouth daily.      Marland Kitchen warfarin (COUMADIN) 2 MG tablet Take 2-3 mg by mouth daily. 2 mg Tuesdays and Thursdays and 3 mg M,W,F,S,Sun       No current facility-administered medications on file prior to visit.   Allergies  Allergen Reactions  . Advair Diskus [Fluticasone-Salmeterol] Other (See Comments)    SKIN CHANGES  . Amiodarone Other (See Comments)    PULMONARY TOXICITY  . Codeine     unknown  . Diovan [Valsartan] Other (See Comments)    HYPOTENSION  . Doxycycline Diarrhea and Other (See Comments)    VISUAL DISTURBANCE  . Flexeril [Cyclobenzaprine] Other (See Comments)    FATIGUE  . Keflex [Cephalexin] Diarrhea  . Verapamil Other (See Comments)    EDEMA  Past Medical History  Diagnosis Date  . Atrial fibrillation   . COPD (chronic obstructive pulmonary disease)   . Bronchitis, chronic   . History of Bell's palsy   . Hypertension   . Coronary artery disease   . Migraine headache   . Atrial flutter   . Tobacco abuse   . Osteopenia   . Cervical dysplasia   . Thyroid disease     Hypo  . Elevated cholesterol   . Unspecified vitamin D deficiency   . Family history of anesthesia complication     Hard for daughter to wake up after Anesthesia  . Shortness of breath     with exertion  . Hypothyroidism   . Cystocele     either on bladder or kidneys patient is unsure but stated physician will watch it  . Seasonal allergies   . Dizziness     If patient gets out of bed too fast  . H/O hiatal hernia   . Rosacea conjunctivitis     Right eye is worse than left eye  . Arthritis   . Gout   .  Aortic stenosis     mild AS witm mild to mod AR by 11/2012 echo       Review of Systems  Constitutional: Positive for fatigue.  HENT: Positive for congestion.   Respiratory: Positive for cough and shortness of breath.   Gastrointestinal: Positive for abdominal pain.  Neurological: Positive for headaches.  All other systems reviewed and are negative.   BP 106/60  Pulse 68  Temp(Src) 98 F (36.7 C) (Temporal)  Resp 18  Ht 5' 3.25" (1.607 m)  Wt 175 lb (79.379 kg)  BMI 30.74 kg/m2     Objective:   Physical Exam  Nursing note and vitals reviewed. Constitutional: She is oriented to person, place, and time. She appears well-developed and well-nourished. No distress.  HENT:  Head: Normocephalic and atraumatic.  Right Ear: External ear normal.  Left Ear: External ear normal.  Nose: Nose normal.  Mouth/Throat: Oropharynx is clear and moist. No oropharyngeal exudate.  Cloudy TM's bilaterally Mild ? Fullness left > right maxillary  Eyes: Conjunctivae and EOM are normal.  Neck: Normal range of motion. Neck supple. No JVD present. No thyromegaly present.  Cardiovascular: Normal rate, regular rhythm, normal heart sounds and intact distal pulses.   Pulmonary/Chest: Effort normal and breath sounds normal.  Abdominal: Soft. Bowel sounds are normal. She exhibits no distension and no mass. There is no tenderness. There is no rebound and no guarding.  Musculoskeletal: Normal range of motion. She exhibits no edema and no tenderness.  Lymphadenopathy:    She has no cervical adenopathy.  Neurological: She is alert and oriented to person, place, and time. No cranial nerve deficit.  Skin: Skin is warm and dry. No rash noted. No erythema. No pallor.  Psychiatric: She has a normal mood and affect. Her behavior is normal. Judgment and thought content normal.          Assessment & Plan:  1. ?sinusitis vs Allergic rhinitis- Allegra OTC, increase H2o, allergy hygiene explained. Flonase OTC  AD, use with caution. Zpak if symptoms increase. ADvised of OVERUSE of antibiotics. 2. SOB mild improvement with lasix with recent Elevated BNP after surgery- She has f/u with Surgeon soon.  3. ? Recent UTI vs Fatigue with recent surgery- check labs, increase activity and H2O

## 2013-05-04 NOTE — Patient Instructions (Signed)
Antibiotic Resistance Antibiotics are drugs. They fight infections caused by bacteria. Antibiotics greatly reduce illness and death from infectious diseases. Over time, the bacteria that antibiotics once controlled are much harder to kill. CAUSES  Antibiotic resistance occurs when bacteria change in some way. These changes can lessen the abilities of drugs designed to cure infections. The over-use of antibiotics can cause antibiotic resistance. Almost all important bacterial infections in the world are becoming resistant to drugs. Antibiotic resistance has been called one of the world's most pressing public health problems.  Antibiotics should be used to treat bacterial infections. But they are not effective against viral infections. These include the common cold, most sore throats, and the flu. Smart use of antibiotics will control the spread of resistance.  TREATMENT   Only use antibiotics as prescribed by your caregiver.  Talk with your caregiver about antibiotic resistance.  Ask what else you can do to feel better.  Do not take an antibiotic for a viral infection. This could be a cold, cough or the flu.  Do not save some of your antibiotic for the next time you get sick.  Take an antibiotic exactly as the caregiver tells you.  Do not take an antibiotic that is prescribed for someone else.  Use the antibiotic as directed. Take the correct dose at the scheduled time. SEEK MEDICAL CARE IF:  You react to the antibiotic with:  A rash.  Itching.  An upset stomach. Document Released: 05/22/2002 Document Revised: 05/24/2011 Document Reviewed: 12/25/2007 [facility] Patient Information 2014 [place].  Allergic Rhinitis ADD Allegra Allergic rhinitis is when the mucous membranes in the nose respond to allergens. Allergens are particles in the air that cause your body to have an allergic reaction. This causes you to release allergic antibodies. Through a chain of events, these  eventually cause you to release histamine into the blood stream. Although meant to protect the body, it is this release of histamine that causes your discomfort, such as frequent sneezing, congestion, and an itchy, runny nose.  CAUSES  Seasonal allergic rhinitis (hay fever) is caused by pollen allergens that may come from grasses, trees, and weeds. Year-round allergic rhinitis (perennial allergic rhinitis) is caused by allergens such as house dust mites, pet dander, and mold spores.  SYMPTOMS   Nasal stuffiness (congestion).  Itchy, runny nose with sneezing and tearing of the eyes. DIAGNOSIS  Your health care provider can help you determine the allergen or allergens that trigger your symptoms. If you and your health care provider are unable to determine the allergen, skin or blood testing may be used. TREATMENT  Allergic Rhinitis does not have a cure, but it can be controlled by:  Medicines and allergy shots (immunotherapy).  Avoiding the allergen. Hay fever may often be treated with antihistamines in pill or nasal spray forms. Antihistamines block the effects of histamine. There are over-the-counter medicines that may help with nasal congestion and swelling around the eyes. Check with your health care provider before taking or giving this medicine.  If avoiding the allergen or the medicine prescribed do not work, there are many new medicines your health care provider can prescribe. Stronger medicine may be used if initial measures are ineffective. Desensitizing injections can be used if medicine and avoidance does not work. Desensitization is when a patient is given ongoing shots until the body becomes less sensitive to the allergen. Make sure you follow up with your health care provider if problems continue. HOME CARE INSTRUCTIONS It is not possible to  completely avoid allergens, but you can reduce your symptoms by taking steps to limit your exposure to them. It helps to know exactly what you  are allergic to so that you can avoid your specific triggers. SEEK MEDICAL CARE IF:   You have a fever.  You develop a cough that does not stop easily (persistent).  You have shortness of breath.  You start wheezing.  Symptoms interfere with normal daily activities. Document Released: 11/24/2000 Document Revised: 12/20/2012 Document Reviewed: 11/06/2012 [facility] Patient Information 2014 [place].

## 2013-05-05 LAB — URINE CULTURE: Colony Count: 45000

## 2013-05-11 ENCOUNTER — Ambulatory Visit: Payer: Self-pay | Admitting: Internal Medicine

## 2013-05-15 ENCOUNTER — Encounter: Payer: Self-pay | Admitting: Emergency Medicine

## 2013-05-15 ENCOUNTER — Ambulatory Visit (INDEPENDENT_AMBULATORY_CARE_PROVIDER_SITE_OTHER): Payer: Medicare Other | Admitting: Emergency Medicine

## 2013-05-15 VITALS — BP 114/78 | HR 88 | Temp 98.4°F | Resp 18 | Ht 63.25 in | Wt 176.0 lb

## 2013-05-15 DIAGNOSIS — Z79899 Other long term (current) drug therapy: Secondary | ICD-10-CM

## 2013-05-15 DIAGNOSIS — J449 Chronic obstructive pulmonary disease, unspecified: Secondary | ICD-10-CM

## 2013-05-15 DIAGNOSIS — J440 Chronic obstructive pulmonary disease with acute lower respiratory infection: Secondary | ICD-10-CM

## 2013-05-15 DIAGNOSIS — Z7901 Long term (current) use of anticoagulants: Secondary | ICD-10-CM

## 2013-05-15 LAB — CBC WITH DIFFERENTIAL/PLATELET
BASOS ABS: 0 10*3/uL (ref 0.0–0.1)
BASOS PCT: 0 % (ref 0–1)
EOS PCT: 7 % — AB (ref 0–5)
Eosinophils Absolute: 0.5 10*3/uL (ref 0.0–0.7)
HEMATOCRIT: 42.2 % (ref 36.0–46.0)
Hemoglobin: 14.3 g/dL (ref 12.0–15.0)
LYMPHS PCT: 31 % (ref 12–46)
Lymphs Abs: 2.3 10*3/uL (ref 0.7–4.0)
MCH: 30.1 pg (ref 26.0–34.0)
MCHC: 33.9 g/dL (ref 30.0–36.0)
MCV: 88.8 fL (ref 78.0–100.0)
MONO ABS: 0.9 10*3/uL (ref 0.1–1.0)
Monocytes Relative: 12 % (ref 3–12)
Neutro Abs: 3.7 10*3/uL (ref 1.7–7.7)
Neutrophils Relative %: 50 % (ref 43–77)
PLATELETS: 266 10*3/uL (ref 150–400)
RBC: 4.75 MIL/uL (ref 3.87–5.11)
RDW: 14.5 % (ref 11.5–15.5)
WBC: 7.4 10*3/uL (ref 4.0–10.5)

## 2013-05-15 MED ORDER — AZITHROMYCIN 250 MG PO TABS: ORAL_TABLET | ORAL | Status: AC

## 2013-05-15 MED ORDER — IPRATROPIUM-ALBUTEROL 0.5-2.5 (3) MG/3ML IN SOLN
3.0000 mL | Freq: Once | RESPIRATORY_TRACT | Status: AC
  Administered 2013-05-15: 3 mL via RESPIRATORY_TRACT

## 2013-05-15 MED ORDER — SOLIFENACIN SUCCINATE 5 MG PO TABS: 5.0000 mg | ORAL_TABLET | Freq: Every day | ORAL | Status: DC

## 2013-05-15 NOTE — Progress Notes (Signed)
Subjective:    Patient ID: Katie [name], female    DOB: 1940-03-19, 73 y.o.   MRN: [id number]  HPI Comments: 73 yo female finished ZPAK on Friday for sinusitis but now having increased sinus congestion and chest production with yellow. She has increased wheezing. She started mucinex which has helped with extra water that helps loosen. She is coughing so hard she is wetting pants.  She has seen Dr Melvyn Novas in past for lung evaluation and notes negative evaluation.   Cough Associated symptoms include postnasal drip and wheezing.   Current Outpatient Prescriptions on File Prior to Visit  Medication Sig Dispense Refill  . allopurinol (ZYLOPRIM) 300 MG tablet TAKE 1 TABLET BY MOUTH DAILY  90 tablet  1  . aspirin 81 MG tablet Take 81 mg by mouth daily.        . benazepril (LOTENSIN) 20 MG tablet Take 0.5 tablets (10 mg total) by mouth daily.      . bisoprolol-hydrochlorothiazide (ZIAC) 5-6.25 MG per tablet Take 1 tablet by mouth daily.      . digoxin (LANOXIN) 0.25 MG tablet Take 0.125 mcg by mouth daily.       . fluconazole (DIFLUCAN) 150 MG tablet Take 1 tablet weekly for yeast infection  4 tablet  3  . furosemide (LASIX) 80 MG tablet Take 40 mg by mouth daily. daily unless Blood Pressure is under 100      . gabapentin (NEURONTIN) 300 MG capsule TAKE ONE CAPSULE BY MOUTH 3 TIMES A DAY  270 capsule  3  . levothyroxine (SYNTHROID, LEVOTHROID) 50 MCG tablet Take 50 mcg by mouth at bedtime.       Marland Kitchen loteprednol (LOTEMAX) 0.5 % ophthalmic suspension Place 1 drop into both eyes daily.      . montelukast (SINGULAIR) 10 MG tablet Take 1 tablet (10 mg total) by mouth daily as needed. For allergy  90 tablet  3  . phenazopyridine (PYRIDIUM) 200 MG tablet Take 1 tablet (200 mg total) by mouth 3 (three) times daily as needed for pain.  6 tablet  0  . pravastatin (PRAVACHOL) 40 MG tablet Take 40 mg by mouth at bedtime.        . prochlorperazine (COMPAZINE) 5 MG tablet Take 5 mg by mouth every 6 (six) hours as  needed. Dizzy      . roflumilast (DALIRESP) 500 MCG TABS tablet Take 500 mcg by mouth daily.      Marland Kitchen warfarin (COUMADIN) 2 MG tablet Take 2-3 mg by mouth daily. 2 mg Tuesdays and Thursdays and 3 mg M,W,F,S,Sun       No current facility-administered medications on file prior to visit.   Allergies  Allergen Reactions  . Advair Diskus [Fluticasone-Salmeterol] Other (See Comments)    SKIN CHANGES  . Amiodarone Other (See Comments)    PULMONARY TOXICITY  . Codeine     unknown  . Diovan [Valsartan] Other (See Comments)    HYPOTENSION  . Doxycycline Diarrhea and Other (See Comments)    VISUAL DISTURBANCE  . Flexeril [Cyclobenzaprine] Other (See Comments)    FATIGUE  . Keflex [Cephalexin] Diarrhea  . Verapamil Other (See Comments)    EDEMA   Past Medical History  Diagnosis Date  . Atrial fibrillation   . COPD (chronic obstructive pulmonary disease)   . Bronchitis, chronic   . History of Bell's palsy   . Hypertension   . Coronary artery disease   . Migraine headache   . Atrial flutter   .  Tobacco abuse   . Osteopenia   . Cervical dysplasia   . Thyroid disease     Hypo  . Elevated cholesterol   . Unspecified vitamin D deficiency   . Family history of anesthesia complication     Hard for daughter to wake up after Anesthesia  . Shortness of breath     with exertion  . Hypothyroidism   . Cystocele     either on bladder or kidneys patient is unsure but stated physician will watch it  . Seasonal allergies   . Dizziness     If patient gets out of bed too fast  . H/O hiatal hernia   . Rosacea conjunctivitis     Right eye is worse than left eye  . Arthritis   . Gout   . Aortic stenosis     mild AS witm mild to mod AR by 11/2012 echo      Review of Systems  HENT: Positive for congestion and postnasal drip.   Respiratory: Positive for cough and wheezing.   All other systems reviewed and are negative.  BP 114/78  Pulse 88  Temp(Src) 98.4 F (36.9 C) (Oral)  Resp 18   Ht 5' 3.25" (1.607 m)  Wt 176 lb (79.833 kg)  BMI 30.91 kg/m2  SpO2 98%       Objective:   Physical Exam  Nursing note and vitals reviewed. Constitutional: She is oriented to person, place, and time. She appears well-developed and well-nourished.  HENT:  Head: Normocephalic and atraumatic.  Right Ear: External ear normal.  Left Ear: External ear normal.  Nose: Nose normal.  Mouth/Throat: Oropharynx is clear and moist.  Eyes: Conjunctivae and EOM are normal.  Neck: Normal range of motion.  Cardiovascular: Normal rate, regular rhythm, normal heart sounds and intact distal pulses.   Pulmonary/Chest: Effort normal. She has wheezes.  ? rhonchi  Musculoskeletal: Normal range of motion.  Lymphadenopathy:    She has no cervical adenopathy.  Neurological: She is alert and oriented to person, place, and time.  Skin: Skin is warm and dry.  Psychiatric: She has a normal mood and affect. Judgment normal.          Assessment & Plan:  1. COPD HX with chronic bronchitis with acute flare and concern for pneumonia- Duoneb in office, Start Duonebs at home thru [facility], [place] #2 given, RX sent [facility]. Repeat ZPak AD, continue mucinex OTC AD, add Allegra, increase H20- check labs. SX Vesicare 5 mg 1 qd given to help with incontinence SX #7 2. Afib prophylaxis- Check labs with recent ABX, Call office with any unusual bleeding/ bruising/ weakness

## 2013-05-15 NOTE — Patient Instructions (Signed)
Bronchitis °Bronchitis is swelling (inflammation) of the air tubes leading to your lungs (bronchi). This causes mucus and a cough. If the swelling gets bad, you may have trouble breathing. °HOME CARE  °· Rest. °· Drink enough fluids to keep your pee (urine) clear or pale yellow (unless you have a condition where you have to watch how much you drink). °· Only take medicine as told by your doctor. If you were given antibiotic medicines, finish them even if you start to feel better. °· Avoid smoke, irritating chemicals, and strong smells. These make the problem worse. Quit smoking if you smoke. This helps your lungs heal faster. °· Use a cool mist humidifier. Change the water in the humidifier every day. You can also sit in the bathroom with hot shower running for 5 10 minutes. Keep the door closed. °· See your health care provider as told. °· Wash your hands often. °GET HELP IF: °Your problems do not get better after 1 week. °GET HELP RIGHT AWAY IF:  °· Your fever gets worse. °· You have chills. °· Your chest hurts. °· Your problems breathing get worse. °· You have blood in your mucus. °· You pass out (faint). °· You feel lightheaded. °· You have a bad headache. °· You throw up (vomit) again and again. °MAKE SURE YOU: °· Understand these instructions. °· Will watch your condition. °· Will get help right away if you are not doing well or get worse. °Document Released: 08/18/2007 Document Revised: 12/20/2012 Document Reviewed: 10/24/2012 °[employer]® Patient Information ©2014 [employer]. ° °

## 2013-05-16 LAB — PROTIME-INR
INR: 2.38 — ABNORMAL HIGH (ref <1.50)
Prothrombin Time: 25.4 seconds — ABNORMAL HIGH (ref 11.6–15.2)

## 2013-05-16 LAB — DIGOXIN LEVEL: Digoxin Level: 1.2 ng/mL (ref 0.8–2.0)

## 2013-05-18 ENCOUNTER — Telehealth: Payer: Self-pay | Admitting: *Deleted

## 2013-05-18 NOTE — Telephone Encounter (Signed)
Patient called with concerns about negative side effects with nebulizer solution Rx.  Patient currently using DuoNeb, Ipratropium Bromide and Albuterol combo.  States she is supposed to use Nebulizer every 6 hours, however, Albuterol causing her to be too "jittery" increasing heart rate and lasting 4 hours.  Per Kelby Aline, PA-C patient was given samples of Ipratropium to use that only and let us know if symptoms subside.

## 2013-05-21 ENCOUNTER — Encounter: Payer: Self-pay | Admitting: Vascular Surgery

## 2013-05-22 ENCOUNTER — Encounter: Payer: Self-pay | Admitting: Vascular Surgery

## 2013-05-22 ENCOUNTER — Ambulatory Visit (INDEPENDENT_AMBULATORY_CARE_PROVIDER_SITE_OTHER): Payer: Self-pay | Admitting: Vascular Surgery

## 2013-05-22 ENCOUNTER — Ambulatory Visit
Admission: RE | Admit: 2013-05-22 | Discharge: 2013-05-22 | Disposition: A | Discharge status: Home / Self Care | Payer: Medicare Other | Source: Ambulatory Visit | Attending: Vascular Surgery | Admitting: Vascular Surgery

## 2013-05-22 VITALS — BP 120/70 | HR 82 | Resp 16 | Ht 63.5 in | Wt 180.0 lb

## 2013-05-22 DIAGNOSIS — I714 Abdominal aortic aneurysm, without rupture, unspecified: Secondary | ICD-10-CM

## 2013-05-22 DIAGNOSIS — Z48812 Encounter for surgical aftercare following surgery on the circulatory system: Secondary | ICD-10-CM

## 2013-05-22 MED ORDER — IOHEXOL 350 MG/ML SOLN
80.0000 mL | Freq: Once | INTRAVENOUS | Status: AC | PRN
  Administered 2013-05-22: 80 mL via INTRAVENOUS

## 2013-05-22 NOTE — Progress Notes (Signed)
Subjective:     Patient ID: Katie [name], female   DOB: [birth year], 73 y.o.   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 She's done there will since her surgery. She denies any claudication abdominal pain back pain or flank pain. Appetite has returned to normal. She does continue to feel weak.  Past Medical History  Diagnosis Date  . Atrial fibrillation   . COPD (chronic obstructive pulmonary disease)   . Bronchitis, chronic   . History of Bell's palsy   . Hypertension   . Coronary artery disease   . Migraine headache   . Atrial flutter   . Tobacco abuse   . Osteopenia   . Cervical dysplasia   . Thyroid disease     Hypo  . Elevated cholesterol   . Unspecified vitamin D deficiency   . Family history of anesthesia complication     Hard for daughter to wake up after Anesthesia  . Shortness of breath     with exertion  . Hypothyroidism   . Cystocele     either on bladder or kidneys patient is unsure but stated physician will watch it  . Seasonal allergies   . Dizziness     If patient gets out of bed too fast  . H/O hiatal hernia   . Rosacea conjunctivitis     Right eye is worse than left eye  . Arthritis   . Gout   . Aortic stenosis     mild AS witm mild to mod AR by 11/2012 echo    History  Substance Use Topics  . Smoking status: Former Smoker    Types: Cigarettes    Quit date: 08/10/2002  . Smokeless tobacco: Never Used     Comment: History of tobacco abuse  . Alcohol Use: 0.0 oz/week     Comment: rare    Family History  Problem Relation Age of Onset  . Cirrhosis Mother   . Cancer Mother 49    PANCREAS  . Heart defect Sister   . Breast cancer Sister     age 55  . Heart disease Sister    . Stroke Sister   . Alcohol abuse Father   . Depression Father   . Hypertension Brother   . Hyperlipidemia Son     Allergies  Allergen Reactions  . Advair Diskus [Fluticasone-Salmeterol] Other (See Comments)    SKIN CHANGES  . Amiodarone Other (See Comments)    PULMONARY TOXICITY  . Codeine     unknown  . Diovan [Valsartan] Other (See Comments)    HYPOTENSION  . Doxycycline Diarrhea and Other (See Comments)    VISUAL DISTURBANCE  . Flexeril [Cyclobenzaprine] Other (See Comments)    FATIGUE  . Keflex [Cephalexin] Diarrhea  . Verapamil Other (See Comments)    EDEMA    Current outpatient prescriptions:allopurinol (ZYLOPRIM) 300 MG tablet, TAKE 1 TABLET BY MOUTH DAILY, Disp: 90 tablet, Rfl: 1;  aspirin 81 MG tablet, Take 81 mg by mouth daily.  , Disp: , Rfl: ;  benazepril (LOTENSIN) 20 MG tablet, Take 0.5 tablets (10 mg total) by mouth daily., Disp: , Rfl: ;  bisoprolol-hydrochlorothiazide (ZIAC) 5-6.25 MG per tablet, Take 1 tablet by mouth daily., Disp: , Rfl:  digoxin (LANOXIN) 0.25 MG tablet, Take 0.125  mcg by mouth daily. , Disp: , Rfl: ;  furosemide (LASIX) 80 MG tablet, Take 40 mg by mouth daily. daily unless Blood Pressure is under 100, Disp: , Rfl: ;  gabapentin (NEURONTIN) 300 MG capsule, TAKE ONE CAPSULE BY MOUTH 3 TIMES A DAY, Disp: 270 capsule, Rfl: 3;  levothyroxine (SYNTHROID, LEVOTHROID) 50 MCG tablet, Take 50 mcg by mouth at bedtime. , Disp: , Rfl:  loteprednol (LOTEMAX) 0.5 % ophthalmic suspension, Place 1 drop into both eyes daily., Disp: , Rfl: ;  montelukast (SINGULAIR) 10 MG tablet, Take 1 tablet (10 mg total) by mouth daily as needed. For allergy, Disp: 90 tablet, Rfl: 3;  pravastatin (PRAVACHOL) 40 MG tablet, Take 40 mg by mouth at bedtime.  , Disp: , Rfl: ;  prochlorperazine (COMPAZINE) 5 MG tablet, Take 5 mg by mouth every 6 (six) hours as needed. Dizzy, Disp: , Rfl:  roflumilast (DALIRESP) 500 MCG TABS tablet, Take 500 mcg by mouth daily., Disp: , Rfl: ;  warfarin  (COUMADIN) 2 MG tablet, Take 2-3 mg by mouth daily. 2 mg Tuesdays and Thursdays and 3 mg M,W,F,S,Sun, Disp: , Rfl: ;  fluconazole (DIFLUCAN) 150 MG tablet, Take 1 tablet weekly for yeast infection, Disp: 4 tablet, Rfl: 3 phenazopyridine (PYRIDIUM) 200 MG tablet, Take 1 tablet (200 mg total) by mouth 3 (three) times daily as needed for pain., Disp: 6 tablet, Rfl: 0;  solifenacin (VESICARE) 5 MG tablet, Take 1 tablet (5 mg total) by mouth daily., Disp: 7 tablet, Rfl: 0  BP 120/70  Pulse 82  Resp 16  Ht 5' 3.5" (1.613 m)  Wt 180 lb (81.647 kg)  BMI 31.38 kg/m2  Body mass index is 31.38 kg/(m^2).           Review of Systems     Objective:   Physical Exam BP 120/70  Pulse 82  Resp 16  Ht 5' 3.5" (1.613 m)  Wt 180 lb (81.647 kg)  BMI 31.38 kg/m2  General well-developed well-nourished female in no apparent stress alert and oriented x3 Abdomen soft nontender no pulsatile mass appreciated 3+ femoral and 2 posterior cells pedis pulses palpable bilaterally.      Assessment:     Today I reviewed a CT angiogram by computer which was performed. There is no evidence of endoleak. The aneurysm itself is about 51 mm in maximum diameter slightly diminished from preoperatively. Graft is in excellent position.    Plan:     Return in 6 months for CT angiogram of abdomen and pelvis to continue followup of aortic stent graft Coumadin has been resumed for chronic atrial fibrillation

## 2013-05-22 NOTE — Addendum Note (Signed)
Addended by: Dorthula Rue L on: 05/22/2013 04:23 PM   Modules accepted: Orders

## 2013-05-24 ENCOUNTER — Other Ambulatory Visit: Payer: Self-pay | Admitting: Emergency Medicine

## 2013-05-24 ENCOUNTER — Telehealth: Payer: Self-pay | Admitting: Internal Medicine

## 2013-05-24 MED ORDER — IPRATROPIUM BROMIDE 0.02 % IN SOLN: 0.5000 mg | RESPIRATORY_TRACT | Status: DC | PRN

## 2013-05-24 NOTE — Telephone Encounter (Signed)
PT CALLED AGAIN TO ASK HOW LONG SHE IS TO USE THE NEBULIZER. SHE IS ALMOST OUT OF THE MEDICINE.  PLEASE ADVISE PT IF SHE IS TO CONTINUE, IF SO, PLEASE SEND AN RX TO [facility] [address]. NOT [facility],PT HAD REACTION TO THIS MEDICINE.  THANKS KATRINA

## 2013-06-08 ENCOUNTER — Ambulatory Visit (INDEPENDENT_AMBULATORY_CARE_PROVIDER_SITE_OTHER): Payer: Medicare Other | Admitting: Internal Medicine

## 2013-06-08 ENCOUNTER — Encounter: Payer: Self-pay | Admitting: Internal Medicine

## 2013-06-08 VITALS — BP 106/64 | HR 72 | Temp 98.1°F | Resp 16 | Ht 63.25 in | Wt 181.8 lb

## 2013-06-08 DIAGNOSIS — R7309 Other abnormal glucose: Secondary | ICD-10-CM

## 2013-06-08 DIAGNOSIS — E782 Mixed hyperlipidemia: Secondary | ICD-10-CM

## 2013-06-08 DIAGNOSIS — N183 Chronic kidney disease, stage 3 unspecified: Secondary | ICD-10-CM | POA: Insufficient documentation

## 2013-06-08 DIAGNOSIS — Z7901 Long term (current) use of anticoagulants: Secondary | ICD-10-CM

## 2013-06-08 DIAGNOSIS — Z79899 Other long term (current) drug therapy: Secondary | ICD-10-CM

## 2013-06-08 DIAGNOSIS — E559 Vitamin D deficiency, unspecified: Secondary | ICD-10-CM

## 2013-06-08 DIAGNOSIS — I10 Essential (primary) hypertension: Secondary | ICD-10-CM

## 2013-06-08 DIAGNOSIS — I4891 Unspecified atrial fibrillation: Secondary | ICD-10-CM

## 2013-06-08 DIAGNOSIS — I70219 Atherosclerosis of native arteries of extremities with intermittent claudication, unspecified extremity: Secondary | ICD-10-CM

## 2013-06-08 DIAGNOSIS — J8410 Pulmonary fibrosis, unspecified: Secondary | ICD-10-CM

## 2013-06-08 DIAGNOSIS — J45909 Unspecified asthma, uncomplicated: Secondary | ICD-10-CM

## 2013-06-08 LAB — CBC WITH DIFFERENTIAL/PLATELET
Basophils Absolute: 0 10*3/uL (ref 0.0–0.1)
Basophils Relative: 0 % (ref 0–1)
EOS PCT: 5 % (ref 0–5)
Eosinophils Absolute: 0.4 10*3/uL (ref 0.0–0.7)
HCT: 41.1 % (ref 36.0–46.0)
Hemoglobin: 14.2 g/dL (ref 12.0–15.0)
LYMPHS PCT: 27 % (ref 12–46)
Lymphs Abs: 2.4 10*3/uL (ref 0.7–4.0)
MCH: 30.6 pg (ref 26.0–34.0)
MCHC: 34.5 g/dL (ref 30.0–36.0)
MCV: 88.6 fL (ref 78.0–100.0)
MONOS PCT: 5 % (ref 3–12)
Monocytes Absolute: 0.4 10*3/uL (ref 0.1–1.0)
Neutro Abs: 5.6 10*3/uL (ref 1.7–7.7)
Neutrophils Relative %: 63 % (ref 43–77)
PLATELETS: 241 10*3/uL (ref 150–400)
RBC: 4.64 MIL/uL (ref 3.87–5.11)
RDW: 14.9 % (ref 11.5–15.5)
WBC: 8.9 10*3/uL (ref 4.0–10.5)

## 2013-06-08 LAB — HEPATIC FUNCTION PANEL
ALK PHOS: 42 U/L (ref 39–117)
ALT: 12 U/L (ref 0–35)
AST: 16 U/L (ref 0–37)
Albumin: 4 g/dL (ref 3.5–5.2)
BILIRUBIN INDIRECT: 0.4 mg/dL (ref 0.2–1.2)
Bilirubin, Direct: 0.1 mg/dL (ref 0.0–0.3)
Total Bilirubin: 0.5 mg/dL (ref 0.2–1.2)
Total Protein: 6.4 g/dL (ref 6.0–8.3)

## 2013-06-08 LAB — HEMOGLOBIN A1C
Hgb A1c MFr Bld: 5.5 % (ref <5.7)
MEAN PLASMA GLUCOSE: 111 mg/dL (ref <117)

## 2013-06-08 LAB — BASIC METABOLIC PANEL WITH GFR
BUN: 20 mg/dL (ref 6–23)
CALCIUM: 9.5 mg/dL (ref 8.4–10.5)
CHLORIDE: 103 meq/L (ref 96–112)
CO2: 25 mEq/L (ref 19–32)
Creat: 1.06 mg/dL (ref 0.50–1.10)
GFR, Est African American: 61 mL/min
GFR, Est Non African American: 53 mL/min — ABNORMAL LOW
Glucose, Bld: 111 mg/dL — ABNORMAL HIGH (ref 70–99)
Potassium: 4.5 mEq/L (ref 3.5–5.3)
Sodium: 139 mEq/L (ref 135–145)

## 2013-06-08 LAB — LIPID PANEL
CHOL/HDL RATIO: 4.9 ratio
Cholesterol: 183 mg/dL (ref 0–200)
HDL: 37 mg/dL — AB (ref >39)
LDL Cholesterol: 101 mg/dL — ABNORMAL HIGH (ref 0–99)
TRIGLYCERIDES: 226 mg/dL — AB (ref <150)
VLDL: 45 mg/dL — ABNORMAL HIGH (ref 0–40)

## 2013-06-08 LAB — MAGNESIUM: MAGNESIUM: 1.9 mg/dL (ref 1.5–2.5)

## 2013-06-08 MED ORDER — ALBUTEROL SULFATE HFA 108 (90 BASE) MCG/ACT IN AERS: INHALATION_SPRAY | RESPIRATORY_TRACT | Status: DC

## 2013-06-08 NOTE — Progress Notes (Signed)
Patient ID: Katie [name], female   DOB: 18-Jul-1940, 73 y.o.   MRN: [id number]    This very nice 73 y.o. MWF presents for 3 month follow up with Hypertension,  Chronic Afib, COPD, Hyperlipidemia, Pre-Diabetes and Vitamin D Deficiency.    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 In July 2014 she had a neg Lexiscan with an EF of 50%. BP has been controlled at home. Today's BP: 106/64 mmHg . Patient denies any cardiac type chest pain, palpitations, dyspnea/orthopnea/PND, dizziness, claudication, or dependent edema. 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 She does report DOE with rapid gait. She does depend on Daliresp for relief which she takes QOD to stretch out her Sx's as she is unable to afford it otherwise.    Hyperlipidemia is controlled with diet & meds. Last Cholesterol was 168, Triglycerides were 153, HDL 36 and LDL 101 in Dec 2014 - at goal. Patient denies myalgias or other med SE's.    Also, the patient has history of PreDiabetes since Dec 2013 and with last A1c of 5.7% in Dec 2014. Patient denies any symptoms of reactive hypoglycemia, diabetic polys, paresthesias or visual blurring.   Further, Patient has history of Vitamin D Deficiency of 32 in 2008 with last vitamin D of 47 in Dec 2014. Patient supplements vitamin D without any suspected side-effects.  Medication Sig  . allopurinol (ZYLOPRIM) 300 MG tablet TAKE 1 TABLET BY MOUTH DAILY  . aspirin 81 MG tablet Take 81 mg by mouth daily.    . benazepril (LOTENSIN) 20 MG tablet Take 0.5 tablets (10 mg total) by mouth daily.  . bisoprolol-hydrochlorothiazide (ZIAC) 5-6.25 MG per tablet Take 1 tablet by mouth daily.  . digoxin (LANOXIN) 0.25 MG tablet Take 0.125 mcg by mouth daily.   . fluconazole (DIFLUCAN) 150 MG tablet Take 1 tablet weekly for yeast infection - prn  . furosemide (LASIX) 80 MG tablet Take 40 mg  by mouth daily unless BP is under 100  . gabapentin (NEURONTIN) 300 MG capsule TAKE ONE CAPSULE BY MOUTH 3 TIMES A DAY  . ipratropium (ATROVENT) 0.02 % nebulizer solution Take 2.5 mLs (0.5 mg total) by nebulization every 4 (four) hours  . levothyroxine (SYNTHROID, LEVOTHROID) 50 MCG tablet Take 50 mcg by mouth at bedtime.   Marland Kitchen loteprednol (LOTEMAX) 0.5 % ophthalmic suspension Place 1 drop into both eyes daily.  . montelukast (SINGULAIR) 10 MG tablet Take 1 tablet (10 mg total) by mouth daily as needed. For allergy  . phenazopyridine (PYRIDIUM) 200 MG tablet Take 1 tablet  3  times daily as needed for pain.  . pravastatin (PRAVACHOL) 40 MG tablet Take 40 mg by mouth at bedtime.    . prochlorperazine (COMPAZINE) 5 MG tablet Take 5 mg by mouth every 6  hours as needed. Dizzy  . roflumilast (DALIRESP) 500 MCG TABS tablet Take 500 mcg by mouth daily.  . solifenacin (VESICARE) 5 MG tablet Take 1 tablet  by mouth daily.  Marland Kitchen warfarin (COUMADIN) 2 MG tablet Take 2-3 mg by mouth daily. 2 mg Tuesdays and Thursdays and 3 mg M,W,F,S,Sun      Allergies  Allergen Reactions  . Advair Diskus [Fluticasone-Salmeterol] Other (See Comments)    SKIN CHANGES  . Amiodarone Other (See Comments)    PULMONARY TOXICITY  . Codeine     unknown  . Diovan [Valsartan] Other (See Comments)  HYPOTENSION  . Doxycycline Diarrhea and Other (See Comments)    VISUAL DISTURBANCE  . Flexeril [Cyclobenzaprine] Other (See Comments)    FATIGUE  . Keflex [Cephalexin] Diarrhea  . Verapamil Other (See Comments)    EDEMA    PMHx:   Past Medical History  Diagnosis Date  . Atrial fibrillation   . COPD (chronic obstructive pulmonary disease)   . Bronchitis, chronic   . History of Bell's palsy   . Hypertension   . Coronary artery disease   . Migraine headache   . Atrial flutter   . Tobacco abuse   . Osteopenia   . Cervical dysplasia   . Thyroid disease     Hypo  . Elevated cholesterol   . Unspecified vitamin D  deficiency   . Family history of anesthesia complication     Hard for daughter to wake up after Anesthesia  . Shortness of breath     with exertion  . Hypothyroidism   . Cystocele     either on bladder or kidneys patient is unsure but stated physician will watch it  . Seasonal allergies   . Dizziness     If patient gets out of bed too fast  . H/O hiatal hernia   . Rosacea conjunctivitis     Right eye is worse than left eye  . Arthritis   . Gout   . Aortic stenosis     mild AS witm mild to mod AR by 11/2012 echo    FHx:    Reviewed / unchanged  SHx:    Reviewed / unchanged   Systems Review: Constitutional: Denies fever, chills, wt changes, headaches, insomnia, fatigue, night sweats, change in appetite. Eyes: Denies redness, blurred vision, diplopia, discharge, itchy, watery eyes.  ENT: Denies discharge, congestion, post nasal drip, epistaxis, sore throat, earache, hearing loss, dental pain, tinnitus, vertigo, sinus pain, snoring.  CV: Denies chest pain, palpitations, irregular heartbeat, syncope, dyspnea, diaphoresis, orthopnea, PND, claudication, edema. Respiratory: denies cough,  pleurisy, hoarseness, laryngitis, wheezing. Has DOE as above. Gastrointestinal: Denies dysphagia, odynophagia, heartburn, reflux, water brash, abdominal pain or cramps, nausea, vomiting, bloating, diarrhea, constipation, hematemesis, melena, hematochezia,  or hemorrhoids. Genitourinary: Denies dysuria, frequency, urgency, nocturia, hesitancy, discharge, hematuria, flank pain. Musculoskeletal: Denies arthralgias, myalgias, stiffness, jt. swelling, pain, limp, strain/sprain.  Skin: Denies pruritus, rash, hives, warts, acne, eczema, change in skin lesion(s). Neuro: No weakness, tremor, incoordination, spasms, paresthesia, or pain. Psychiatric: Denies confusion, memory loss, or sensory loss. Endo: Denies change in weight, skin, hair change.  Heme/Lymph: No excessive bleeding, bruising, orenlarged lymph  nodes.   Exam:    BP 106/64  Pulse 72  Temp 98.1 F   Resp 16  Ht 5' 3.25"   Wt 181 lb 12.8 oz   BMI 31.93 kg/m2  Appears well nourished - in no distress. Eyes: PERRLA, EOMs, conjunctiva no swelling or erythema. Sinuses: No frontal/maxillary tenderness ENT/Mouth: EAC's clear, TM's nl w/o erythema, bulging. Nares clear w/o erythema, swelling, exudates. Oropharynx clear without erythema or exudates. Oral hygiene is good. Tongue normal, non obstructing. Hearing intact.  Neck: Supple. Thyroid nl. Car 2+/2+ without bruits, nodes or JVD. Chest: Respirations nl with BS clear & equal w/o rales, rhonchi, wheezing or stridor.  Cor: Heart sounds normal w/ irregular rate and rhythm without sig. murmurs, gallops, clicks, or rubs. Trace preTibial, ankle and pedal edema with  PP not well felt and with distal extremity hair loss and coolness of distal foot & toes. Abdomen: Soft & bowel sounds normal.  Non-tender w/o guarding, rebound, hernias, masses, or organomegaly.  Musculoskeletal: Full ROM all peripheral extremities, joint stability, 5/5 strength, and normal gait.  Skin: Warm, dry without exposed rashes, lesions, ecchymosis apparent.  Neuro: Cranial nerves intact, reflexes equal bilaterally. Sensory-motor testing grossly intact. Tendon reflexes grossly intact.  Pysch: Alert & oriented x 3. Insight and judgement nl & appropriate. No ideations.  Assessment and Plan:  1. Hypertension - Continue monitor blood pressure at home. Continue diet/meds same.  2. Hyperlipidemia - Continue diet/meds, exercise,& lifestyle modifications. Continue monitor periodic cholesterol/liver & renal functions   3. Pre-diabetes - Continue diet, exercise, lifestyle modifications. Monitor appropriate labs.  4. ASHD w/Non Ischemic Cardiomyopathy and Chronic A fib  5. Pulmonary Toxicity sequelae of Amiodarone  4. Vitamin D Deficiency - Continue supplementation.  5. ASHD/chAfib  6. Possible Claudication - plan  Arterial Dopplers and if (+) then Vascular consult w/Dr Kellie Simmering  Recommended regular exercise, BP monitoring, weight control, and discussed med and SE's. Recommended labs to assess and monitor clinical status. Further disposition pending results of labs.

## 2013-06-08 NOTE — Patient Instructions (Signed)
Atrial Fibrillation Atrial fibrillation is a type of irregular heart rhythm (arrhythmia). During atrial fibrillation, the upper chambers of the heart (atria) quiver continuously in a chaotic pattern. This causes an irregular and often rapid heart rate.  Atrial fibrillation is the result of the heart becoming overloaded with disorganized signals that tell it to beat. These signals are normally released one at a time by a part of the right atrium called the sinoatrial node. They then travel from the atria to the lower chambers of the heart (ventricles), causing the atria and ventricles to contract and pump blood as they pass. In atrial fibrillation, parts of the atria outside of the sinoatrial node also release these signals. This results in two problems. First, the atria receive so many signals that they do not have time to fully contract. Second, the ventricles, which can only receive one signal at a time, beat irregularly and out of rhythm with the atria.  There are three types of atrial fibrillation:   Paroxysmal Paroxysmal atrial fibrillation starts suddenly and stops on its own within a week.   Persistent Persistent atrial fibrillation lasts for more than a week. It may stop on its own or with treatment.   Permanent Permanent atrial fibrillation does not go away. Episodes of atrial fibrillation may lead to permanent atrial fibrillation.  Atrial fibrillation can prevent your heart from pumping blood normally. It increases your risk of stroke and can lead to heart failure.  CAUSES   Heart conditions, including a heart attack, heart failure, coronary artery disease, and heart valve conditions.   Inflammation of the sac that surrounds the heart (pericarditis).   Blockage of an artery in the lungs (pulmonary embolism).   Pneumonia or other infections.   Chronic lung disease.   Thyroid problems, especially if the thyroid is overactive (hyperthyroidism).   Caffeine, excessive  alcohol use, and use of some illegal drugs.   Use of some medications, including certain decongestants and diet pills.   Heart surgery.   Birth defects.  Sometimes, no cause can be found. When this happens, the atrial fibrillation is called lone atrial fibrillation. The risk of complications from atrial fibrillation increases if you have lone atrial fibrillation and you are age 71 years or older. RISK FACTORS  Heart failure.  Coronary artery disease  Diabetes mellitus.   High blood pressure (hypertension).   Obesity.   Other arrhythmias.   Increased age. SYMPTOMS   A feeling that your heart is beating rapidly or irregularly.   A feeling of discomfort or pain in your chest.   Shortness of breath.   Sudden lightheadedness or weakness.   Getting tired easily when exercising.   Urinating more often than normal (mainly when atrial fibrillation first begins).  In paroxysmal atrial fibrillation, symptoms may start and suddenly stop. DIAGNOSIS  Your caregiver may be able to detect atrial fibrillation when taking your pulse. Usually, testing is needed to diagnosis atrial fibrillation. Tests may include:   Electrocardiography. During this test, the electrical impulses of your heart are recorded while you are lying down.   Echocardiography. During echocardiography, sound waves are used to evaluate how blood flows through your heart.   Stress test. There is more than one type of stress test. If a stress test is needed, ask your caregiver about which type is best for you.   Chest X-ray exam.   Blood tests.   Computed tomography (CT).  TREATMENT   Treating any underlying conditions. For example, if you have an  overactive thyroid, treating the condition may correct atrial fibrillation.   Medication. Medications may be given to control a rapid heart rate or to prevent blood clots, heart failure, or a stroke.   Procedure to correct the rhythm of the  heart:  Electrical cardioversion. During electrical cardioversion, a controlled, low-energy shock is delivered to the heart through your skin. If you have chest pain, very low pressure blood pressure, or sudden heart failure, this procedure may need to be done as an emergency.  Catheter ablation. During this procedure, heart tissues that send the signals that cause atrial fibrillation are destroyed.  Maze or minimaze procedure. During this surgery, thin lines of heart tissue that carry the abnormal signals are destroyed. The maze procedure is an open-heart surgery. The minimaze procedure is a minimally invasive surgery. This means that small cuts are made to access the heart instead of a large opening.  Pulmonary venous isolation. During this surgery, tissue around the veins that carry blood from the lungs (pulmonary veins) is destroyed. This tissue is thought to carry the abnormal signals. HOME CARE INSTRUCTIONS   Take medications as directed by your caregiver.  Only take medications that your caregiver approves. Some medications can make atrial fibrillation worse or recur.  If blood thinners were prescribed by your caregiver, take them exactly as directed. Too much can cause bleeding. Too little and you will not have the needed protection against stroke and other problems.  Perform blood tests at home if directed by your caregiver.  Perform blood tests exactly as directed.   Quit smoking if you smoke.   Do not drink alcohol.   Do not drink caffeinated beverages such as coffee, soda, and some teas. You may drink decaffeinated coffee, soda, or tea.   Maintain a healthy weight. Do not use diet pills unless your caregiver approves. They may make heart problems worse.   Follow diet instructions as directed by your caregiver.   Exercise regularly as directed by your caregiver.   Keep all follow-up appointments. PREVENTION  The following substances can cause atrial fibrillation  to recur:   Caffeinated beverages.   Alcohol.   Certain medications, especially those used for breathing problems.   Certain herbs and herbal medications, such as those containing ephedra or ginseng.  Illegal drugs such as cocaine and amphetamines. Sometimes medications are given to prevent atrial fibrillation from recurring. Proper treatment of any underlying condition is also important in helping prevent recurrence.  SEEK MEDICAL CARE IF:  You notice a change in the rate, rhythm, or strength of your heartbeat.   You suddenly begin urinating more frequently.   You tire more easily when exerting yourself or exercising.  SEEK IMMEDIATE MEDICAL CARE IF:   You develop chest pain, abdominal pain, sweating, or weakness.  You feel sick to your stomach (nauseous).  You develop shortness of breath.  You suddenly develop swollen feet and ankles.  You feel dizzy.  You face or limbs feel numb or weak.  There is a change in your vision or speech. MAKE SURE YOU:   Understand these instructions.  Will watch your condition.  Will get help right away if you are not doing well or get worse. Document Released: 03/01/2005 Document Revised: 06/26/2012 Document Reviewed: 04/11/2012 [facility] Patient Information 2014 [place].  Hypertension As your heart beats, it forces blood through your arteries. This force is your blood pressure. If the pressure is too high, it is called hypertension (HTN) or high blood pressure. HTN is dangerous because  you may have it and not know it. High blood pressure may mean that your heart has to work harder to pump blood. Your arteries may be narrow or stiff. The extra work puts you at risk for heart disease, stroke, and other problems.  Blood pressure consists of two numbers, a higher number over a lower, 110/72, for example. It is stated as "110 over 72." The ideal is below 120 for the top number (systolic) and under 80 for the bottom (diastolic).  Write down your blood pressure today. You should pay close attention to your blood pressure if you have certain conditions such as:  Heart failure.  Prior heart attack.  Diabetes  Chronic kidney disease.  Prior stroke.  Multiple risk factors for heart disease. To see if you have HTN, your blood pressure should be measured while you are seated with your arm held at the level of the heart. It should be measured at least twice. A one-time elevated blood pressure reading (especially in the Emergency Department) does not mean that you need treatment. There may be conditions in which the blood pressure is different between your right and left arms. It is important to see your caregiver soon for a recheck. Most people have essential hypertension which means that there is not a specific cause. This type of high blood pressure may be lowered by changing lifestyle factors such as:  Stress.  Smoking.  Lack of exercise.  Excessive weight.  Drug/tobacco/alcohol use.  Eating less salt. Most people do not have symptoms from high blood pressure until it has caused damage to the body. Effective treatment can often prevent, delay or reduce that damage. TREATMENT  When a cause has been identified, treatment for high blood pressure is directed at the cause. There are a large number of medications to treat HTN. These fall into several categories, and your caregiver will help you select the medicines that are best for you. Medications may have side effects. You should review side effects with your caregiver. If your blood pressure stays high after you have made lifestyle changes or started on medicines,   Your medication(s) may need to be changed.  Other problems may need to be addressed.  Be certain you understand your prescriptions, and know how and when to take your medicine.  Be sure to follow up with your caregiver within the time frame advised (usually within two weeks) to have your blood  pressure rechecked and to review your medications.  If you are taking more than one medicine to lower your blood pressure, make sure you know how and at what times they should be taken. Taking two medicines at the same time can result in blood pressure that is too low. SEEK IMMEDIATE MEDICAL CARE IF:  You develop a severe headache, blurred or changing vision, or confusion.  You have unusual weakness or numbness, or a faint feeling.  You have severe chest or abdominal pain, vomiting, or breathing problems. MAKE SURE YOU:   Understand these instructions.  Will watch your condition.  Will get help right away if you are not doing well or get worse.   Diabetes and Exercise Exercising regularly is important. It is not just about losing weight. It has many health benefits, such as:  Improving your overall fitness, flexibility, and endurance.  Increasing your bone density.  Helping with weight control.  Decreasing your body fat.  Increasing your muscle strength.  Reducing stress and tension.  Improving your overall health. People with diabetes who exercise  gain additional benefits because exercise:  Reduces appetite.  Improves the body's use of blood sugar (glucose).  Helps lower or control blood glucose.  Decreases blood pressure.  Helps control blood lipids (such as cholesterol and triglycerides).  Improves the body's use of the hormone insulin by:  Increasing the body's insulin sensitivity.  Reducing the body's insulin needs.  Decreases the risk for heart disease because exercising:  Lowers cholesterol and triglycerides levels.  Increases the levels of good cholesterol (such as high-density lipoproteins [HDL]) in the body.  Lowers blood glucose levels. YOUR ACTIVITY PLAN  Choose an activity that you enjoy and set realistic goals. Your health care provider or diabetes educator can help you make an activity plan that works for you. You can break activities into 2  or 3 sessions throughout the day. Doing so is as good as one long session. Exercise ideas include:  Taking the dog for a walk.  Taking the stairs instead of the elevator.  Dancing to your favorite song.  Doing your favorite exercise with a friend. RECOMMENDATIONS FOR EXERCISING WITH TYPE 1 OR TYPE 2 DIABETES   Check your blood glucose before exercising. If blood glucose levels are greater than 240 mg/dL, check for urine ketones. Do not exercise if ketones are present.  Avoid injecting insulin into areas of the body that are going to be exercised. For example, avoid injecting insulin into:  The arms when playing tennis.  The legs when jogging.  Keep a record of:  Food intake before and after you exercise.  Expected peak times of insulin action.  Blood glucose levels before and after you exercise.  The type and amount of exercise you have done.  Review your records with your health care provider. Your health care provider will help you to develop guidelines for adjusting food intake and insulin amounts before and after exercising.  If you take insulin or oral hypoglycemic agents, watch for signs and symptoms of hypoglycemia. They include:  Dizziness.  Shaking.  Sweating.  Chills.  Confusion.  Drink plenty of water while you exercise to prevent dehydration or heat stroke. Body water is lost during exercise and must be replaced.  Talk to your health care provider before starting an exercise program to make sure it is safe for you. Remember, almost any type of activity is better than none.    Cholesterol Cholesterol is a white, waxy, fat-like protein needed by your body in small amounts. The liver makes all the cholesterol you need. It is carried from the liver by the blood through the blood vessels. Deposits (plaque) may build up on blood vessel walls. This makes the arteries narrower and stiffer. Plaque increases the risk for heart attack and stroke. You cannot feel  your cholesterol level even if it is very high. The only way to know is by a blood test to check your lipid (fats) levels. Once you know your cholesterol levels, you should keep a record of the test results. Work with your caregiver to to keep your levels in the desired range. WHAT THE RESULTS MEAN:  Total cholesterol is a rough measure of all the cholesterol in your blood.  LDL is the so-called bad cholesterol. This is the type that deposits cholesterol in the walls of the arteries. You want this level to be low.  HDL is the good cholesterol because it cleans the arteries and carries the LDL away. You want this level to be high.  Triglycerides are fat that the body can either  burn for energy or store. High levels are closely linked to heart disease. DESIRED LEVELS:  Total cholesterol below 200.  LDL below 100 for people at risk, below 70 for very high risk.  HDL above 50 is good, above 60 is best.  Triglycerides below 150. HOW TO LOWER YOUR CHOLESTEROL:  Diet.  Choose fish or white meat chicken and [place], roasted or baked. Limit fatty cuts of red meat, fried foods, and processed meats, such as sausage and lunch meat.  Eat lots of fresh fruits and vegetables. Choose whole grains, beans, pasta, potatoes and cereals.  Use only small amounts of olive, corn or canola oils. Avoid butter, mayonnaise, shortening or palm kernel oils. Avoid foods with trans-fats.  Use skim/nonfat milk and low-fat/nonfat yogurt and cheeses. Avoid whole milk, cream, ice cream, egg yolks and cheeses. Healthy desserts include angel food cake, ginger snaps, animal crackers, hard candy, popsicles, and low-fat/nonfat frozen yogurt. Avoid pastries, cakes, pies and cookies.  Exercise.  A regular program helps decrease LDL and raises HDL.  Helps with weight control.  Do things that increase your activity level like gardening, walking, or taking the stairs.  Medication.  May be prescribed by your caregiver to  help lowering cholesterol and the risk for heart disease.  You may need medicine even if your levels are normal if you have several risk factors. HOME CARE INSTRUCTIONS   Follow your diet and exercise programs as suggested by your caregiver.  Take medications as directed.  Have blood work done when your caregiver feels it is necessary. MAKE SURE YOU:   Understand these instructions.  Will watch your condition.  Will get help right away if you are not doing well or get worse.      Vitamin D Deficiency Vitamin D is an important vitamin that your body needs. Having too little of it in your body is called a deficiency. A very bad deficiency can make your bones soft and can cause a condition called rickets.  Vitamin D is important to your body for different reasons, such as:   It helps your body absorb 2 minerals called calcium and phosphorus.  It helps make your bones healthy.  It may prevent some diseases, such as diabetes and multiple sclerosis.  It helps your muscles and heart. You can get vitamin D in several ways. It is a natural part of some foods. The vitamin is also added to some dairy products and cereals. Some people take vitamin D supplements. Also, your body makes vitamin D when you are in the sun. It changes the sun's rays into a form of the vitamin that your body can use. CAUSES   Not eating enough foods that contain vitamin D.  Not getting enough sunlight.  Having certain digestive system diseases that make it hard to absorb vitamin D. These diseases include Crohn's disease, chronic pancreatitis, and cystic fibrosis.  Having a surgery in which part of the stomach or small intestine is removed.  Being obese. Fat cells pull vitamin D out of your blood. That means that obese people may not have enough vitamin D left in their blood and in other body tissues.  Having chronic kidney or liver disease. RISK FACTORS Risk factors are things that make you more likely  to develop a vitamin D deficiency. They include:  Being older.  Not being able to get outside very much.  Living in a nursing home.  Having had broken bones.  Having weak or thin bones (osteoporosis).  Having  a disease or condition that changes how your body absorbs vitamin D.  Having dark skin.  Some medicines such as seizure medicines or steroids.  Being overweight or obese. SYMPTOMS Mild cases of vitamin D deficiency may not have any symptoms. If you have a very bad case, symptoms may include:  Bone pain.  Muscle pain.  Falling often.  Broken bones caused by a minor injury, due to osteoporosis. DIAGNOSIS A blood test is the best way to tell if you have a vitamin D deficiency. TREATMENT Vitamin D deficiency can be treated in different ways. Treatment for vitamin D deficiency depends on what is causing it. Options include:  Taking vitamin D supplements.  Taking a calcium supplement. Your caregiver will suggest what dose is best for you. HOME CARE INSTRUCTIONS  Take any supplements that your caregiver prescribes. Follow the directions carefully. Take only the suggested amount.  Have your blood tested 2 months after you start taking supplements.  Eat foods that contain vitamin D. Healthy choices include:  Fortified dairy products, cereals, or juices. Fortified means vitamin D has been added to the food. Check the label on the package to be sure.  Fatty fish like salmon or trout.  Eggs.  Oysters.  Do not use a tanning bed.  Keep your weight at a healthy level. Lose weight if you need to.  Keep all follow-up appointments. Your caregiver will need to perform blood tests to make sure your vitamin D deficiency is going away. SEEK MEDICAL CARE IF:  You have any questions about your treatment.  You continue to have symptoms of vitamin D deficiency.  You have nausea or vomiting.  You are constipated.  You feel confused.  You have severe abdominal or back  pain. MAKE SURE YOU:  Understand these instructions.  Will watch your condition.  Will get help right away if you are not doing well or get worse.

## 2013-06-09 LAB — TSH: TSH: 2.113 u[IU]/mL (ref 0.350–4.500)

## 2013-06-09 LAB — PROTIME-INR
INR: 2.36 — ABNORMAL HIGH (ref <1.50)
Prothrombin Time: 25.2 seconds — ABNORMAL HIGH (ref 11.6–15.2)

## 2013-06-09 LAB — INSULIN, FASTING: Insulin fasting, serum: 294 u[IU]/mL — ABNORMAL HIGH (ref 3–28)

## 2013-06-09 LAB — VITAMIN D 25 HYDROXY (VIT D DEFICIENCY, FRACTURES): VIT D 25 HYDROXY: 42 ng/mL (ref 30–89)

## 2013-06-13 ENCOUNTER — Other Ambulatory Visit: Payer: Self-pay

## 2013-06-13 DIAGNOSIS — I70219 Atherosclerosis of native arteries of extremities with intermittent claudication, unspecified extremity: Secondary | ICD-10-CM

## 2013-06-17 ENCOUNTER — Other Ambulatory Visit: Payer: Self-pay | Admitting: Internal Medicine

## 2013-06-19 ENCOUNTER — Ambulatory Visit (HOSPITAL_COMMUNITY)
Admission: RE | Admit: 2013-06-19 | Discharge: 2013-06-19 | Disposition: A | Discharge status: Home / Self Care | Payer: Medicare Other | Source: Ambulatory Visit | Attending: Vascular Surgery | Admitting: Vascular Surgery

## 2013-06-19 ENCOUNTER — Other Ambulatory Visit: Payer: Self-pay | Admitting: Internal Medicine

## 2013-06-19 DIAGNOSIS — I70219 Atherosclerosis of native arteries of extremities with intermittent claudication, unspecified extremity: Secondary | ICD-10-CM | POA: Insufficient documentation

## 2013-06-27 ENCOUNTER — Encounter: Payer: Self-pay | Admitting: Internal Medicine

## 2013-07-12 ENCOUNTER — Ambulatory Visit (INDEPENDENT_AMBULATORY_CARE_PROVIDER_SITE_OTHER): Payer: Medicare Other | Admitting: Physician Assistant

## 2013-07-12 ENCOUNTER — Encounter: Payer: Self-pay | Admitting: Physician Assistant

## 2013-07-12 VITALS — BP 128/70 | HR 76 | Temp 98.6°F | Resp 16 | Ht 63.0 in | Wt 181.0 lb

## 2013-07-12 DIAGNOSIS — R7303 Prediabetes: Secondary | ICD-10-CM

## 2013-07-12 DIAGNOSIS — Z0000 Encounter for general adult medical examination without abnormal findings: Secondary | ICD-10-CM

## 2013-07-12 DIAGNOSIS — I4891 Unspecified atrial fibrillation: Secondary | ICD-10-CM

## 2013-07-12 DIAGNOSIS — R7309 Other abnormal glucose: Secondary | ICD-10-CM

## 2013-07-12 DIAGNOSIS — Z1331 Encounter for screening for depression: Secondary | ICD-10-CM

## 2013-07-12 NOTE — Progress Notes (Signed)
Subjective:   Katie [name] is a 73 y.o. female who presents for Medicare Annual Wellness Visit and coumadin follow up Date of last medicare wellness visit is unknown.   Her blood pressure has been controlled at home, today their BP is BP: 128/70 mmHg She does not workout. She denies chest pain, shortness of breath, dizziness.   Patient is on Coumadin for Afib Patient's last INR is  Lab Results  Component Value Date   INR 2.36* 06/08/2013   INR 2.38* 05/15/2013   INR 2.41* 05/04/2013    Patient denies SOB, CP, dizziness, nose bleeds, easy bleeding, and blood in stool/urine. Her coumadin dose was not changed last visit.  She has COPD and can not afford a rescue inhaler, she does well on Dalresip but can not afford it. She can not use albuterol due to shakiness and needs xopenex but can not afford it.   Names of Other Physician/Practitioners you currently use: 1. [facility]- here for primary care 2. Dr. Earlean Polka , eye doctor, last visit 08/2012 3. No dentist Patient Care Team: [name], MD as PCP - General (Internal Medicine) Evans Lance, MD as Consulting Physician (Cardiology)  Medication Review Current Outpatient Prescriptions on File Prior to Visit  Medication Sig Dispense Refill  . albuterol (PROVENTIL HFA;VENTOLIN HFA) 108 (90 BASE) MCG/ACT inhaler 2 puffs 5 minutes apart every 4 to 6 hours as needed to rescue asthma  1 Inhaler  99  . allopurinol (ZYLOPRIM) 300 MG tablet TAKE 1 TABLET BY MOUTH DAILY  90 tablet  1  . aspirin 81 MG tablet Take 81 mg by mouth daily.        . benazepril (LOTENSIN) 20 MG tablet Take 0.5 tablets (10 mg total) by mouth daily.      . bisoprolol-hydrochlorothiazide (ZIAC) 5-6.25 MG per tablet Take 1 tablet by mouth daily.      . digoxin (LANOXIN) 0.25 MG tablet Take 0.125 mcg by mouth daily.       . fluconazole (DIFLUCAN) 150 MG tablet Take 1 tablet weekly for yeast infection  4 tablet  3  . furosemide  (LASIX) 80 MG tablet TAKE 1 TABLET BY MOUTH TWICE A DAY AS NEEDED FOR FLUID  90 tablet  2  . gabapentin (NEURONTIN) 300 MG capsule TAKE ONE CAPSULE BY MOUTH 3 TIMES A DAY  270 capsule  3  . ipratropium (ATROVENT) 0.02 % nebulizer solution Take 2.5 mLs (0.5 mg total) by nebulization every 4 (four) hours as needed for wheezing or shortness of breath.  75 mL  2  . levothyroxine (SYNTHROID, LEVOTHROID) 50 MCG tablet Take 50 mcg by mouth at bedtime.       Marland Kitchen loteprednol (LOTEMAX) 0.5 % ophthalmic suspension Place 1 drop into both eyes daily.      . montelukast (SINGULAIR) 10 MG tablet Take 1 tablet (10 mg total) by mouth daily as needed. For allergy  90 tablet  3  . phenazopyridine (PYRIDIUM) 200 MG tablet Take 1 tablet (200 mg total) by mouth 3 (three) times daily as needed for pain.  6 tablet  0  . pravastatin (PRAVACHOL) 40 MG tablet Take 40 mg by mouth at bedtime.        . prochlorperazine (COMPAZINE) 5 MG tablet Take 5 mg by mouth every 6 (six) hours as needed. Dizzy      . roflumilast (DALIRESP) 500 MCG TABS tablet Take 500 mcg by mouth daily.      . solifenacin (VESICARE)  5 MG tablet Take 1 tablet (5 mg total) by mouth daily.  7 tablet  0  . warfarin (COUMADIN) 2 MG tablet Take 2-3 mg by mouth daily. 2 mg Tuesdays and Thursdays and 3 mg M,W,F,S,Sun       No current facility-administered medications on file prior to visit.    Current Problems (verified) Patient Active Problem List   Diagnosis Date Noted  . CKD  stage III (GFR 51 ml/min) 06/08/2013  . Hyperlipidemia 06/08/2013  . Encounter for long-term (current) use of other medications 06/08/2013  . 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 Long term (current) use of anticoagulants 04/23/2013  . AAA (abdominal aortic aneurysm) 04/11/2013  . Vitamin D Deficiency 02/15/2013  . PreDiabetes 02/15/2013  . UTI (urinary tract infection) 01/17/2013  . Cystocele 08/09/2012  . Cervical dysplasia   . Hypothyroidism   . Osteopenia    . Congestive heart failure, Hx/o 11/25/2008  . CAROTID BRUIT 11/25/2008  . MIGRAINE HEADACHE 11/22/2008  . HYPERTENSION 11/22/2008  . 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 Atrial fibrillation 11/22/2008  . COPD 11/22/2008  . GERD 11/22/2008  . BELL'S PALSY, HX OF 11/22/2008    Screening Tests Health Maintenance  Topic Date Due  . Influenza Vaccine  10/13/2013  . Mammogram  08/09/2014  . Tetanus/tdap  03/15/2017  . Colonoscopy  03/15/2022  . Pneumococcal Polysaccharide Vaccine Age 1 And Over  Completed  . Zostavax  Completed     Immunization History  Administered Date(s) Administered  . Influenza-Unspecified 01/02/2013  . Pneumococcal-Unspecified 03/15/1993, 05/31/2008  . Td 03/16/2007  . Varicella 02/19/2008    Preventative care: Last colonoscopy: 2014 Last mammogram: 07/2012 Last pap smear/pelvic exam: remote DEXA:07/2011  Prior vaccinations: TD or Tdap: 2009  Influenza: 2014 Pneumococcal: 2010 Shingles/Zostavax: 2009  History reviewed: allergies, current medications, past family history, past medical history, past social history, past surgical history and problem list  Risk Factors: Osteoporosis: postmenopausal estrogen deficiency and dietary calcium and/or vitamin D deficiency History of fracture in the past year: no  Tobacco History  Substance Use Topics  . Smoking status: Former Smoker    Types: Cigarettes    Quit date: 08/10/2002  . Smokeless tobacco: Never Used     Comment: History of tobacco abuse  . Alcohol Use: 0.0 oz/week     Comment: rare   She does not smoke.  Patient is a former smoker. Are there smokers in your home (other than you)?  No  Alcohol Current alcohol use: social drinker  Caffeine Current caffeine use: coffee 1 /day  Exercise Exercise limitations: The patient is experiencing exercise intolerance (COPD). Current exercise: housecleaning and walking  Nutrition/Diet Current diet: in general, a "healthy" diet    Cardiac risk factors:  advanced age (older than 74 for men, 85 for women), dyslipidemia, hypertension, sedentary lifestyle and smoking/ tobacco exposure.  Depression Screen (Note: if answer to either of the following is "Yes", a more complete depression screening is indicated)   Q1: Over the past two weeks, have you felt down, depressed or hopeless? No  Q2: Over the past two weeks, have you felt little interest or pleasure in doing things? No  Have you lost interest or pleasure in daily life? No  Do you often feel hopeless? No  Do you cry easily over simple problems? No  Activities of Daily Living In your present state of health, do you have any difficulty performing the following activities?:  Driving? No Managing money?  No Feeding yourself? No Getting from bed  to chair? No Climbing a flight of stairs? Yes Preparing food and eating?: No Bathing or showering? No Getting dressed: No Getting to the toilet? No Using the toilet:No Moving around from place to place: No In the past year have you fallen or had a near fall?:No   Are you sexually active?  No  Do you have more than one partner?  No  Vision Difficulties: No  Hearing Difficulties: No Do you often ask people to speak up or repeat themselves? No Do you experience ringing or noises in your ears? No Do you have difficulty understanding soft or whispered voices? No  Cognition  Do you feel that you have a problem with memory?No  Do you often misplace items? No  Do you feel safe at home?  Yes  Advanced directives Does patient have a [place]? No Does patient have a Living Will? No   Objective:   Blood pressure 128/70, pulse 76, temperature 98.6 F (37 C), resp. rate 16, height 5\' 3"  (1.6 m), weight 181 lb (82.101 kg). Body mass index is 32.07 kg/(m^2).  General appearance: alert, no distress, WD/WN,  female Cognitive Testing  Alert? Yes  Normal Appearance?Yes  Oriented to person? Yes  Place? Yes   Time?  Yes  Recall of three objects?  No 1/3 but states it was stress, reasked towards end and she did recall  Can perform simple calculations? Yes  Displays appropriate judgment?Yes  Can read the correct time from a watch face?Yes  HEENT: normocephalic, sclerae anicteric, TMs pearly, nares patent, no discharge or erythema, pharynx normal Oral cavity: MMM, no lesions Neck: supple, no lymphadenopathy, no thyromegaly, no masses Heart: Irreg Irreg, normal S1, S2,  4/6 systolic murmur Lungs: CTA bilaterally, no wheezes, rhonchi, or rales Abdomen: +bs, soft, non tender, non distended, no masses, no hepatomegaly, no splenomegaly Musculoskeletal: nontender, no swelling, no obvious deformity Extremities: no edema, no cyanosis, no clubbing Pulses: 2+ symmetric, upper and lower extremities, normal cap refill Neurological: alert, oriented x 3, CN2-12 intact, strength normal upper extremities and lower extremities, sensation normal throughout, DTRs 2+ throughout, no cerebellar signs, gait normal Psychiatric: normal affect, behavior normal, pleasant  Breast: defer Gyn: defer Rectal: defer   Assessment:   Chronic anticoagulation- check INR and will adjust medication according to labs.  Discussed if patient falls to immediately contact office or go to ER. Discussed foods that can increase or decrease Coumadin levels. Patient understands to call the office before starting a new medication. Follow up in one month.  Plan:   During the course of the visit the patient was educated and counseled about appropriate screening and preventive services including:    Pneumococcal vaccine   Influenza vaccine  Td vaccine  Screening electrocardiogram  Screening mammography  Bone densitometry screening  Colorectal cancer screening  Diabetes screening  Glaucoma screening  Nutrition counseling   Advanced directives: given info  Screening recommendations, referrals:  Vaccinations: Tdap vaccine not  indicated Influenza vaccine not indicated Pneumococcal vaccine not indicated Shingles vaccine not indicated Hep B vaccine not indicated  Nutrition assessed and recommended  Colonoscopy uptodate Mammogram uptodate Pap smear not indicated Pelvic exam not indicated Recommended yearly ophthalmology/optometry visit for glaucoma screening and checkup Recommended yearly dental visit for hygiene and checkup Advanced directives - given info  Conditions/risks identified: BMI: Discussed weight loss, diet, and increase physical activity.  Increase physical activity: AHA recommends 150 minutes of physical activity a week.  Medications reviewed DEXA- requested IN MAY THIS YEAR  Diabetes is at goal, ACE/ARB therapy: Yes. Urinary Incontinence is not an issue: discussed non pharmacology and pharmacology options.  She does have a grade 1-2 cystocele Fall risk: moderate- discussed PT, home fall assessment, medications.   Medicare Attestation I have personally reviewed: The patient's medical and social history Their use of alcohol, tobacco or illicit drugs Their current medications and supplements The patient's functional ability including ADLs,fall risks, home safety risks, cognitive, and hearing and visual impairment Diet and physical activities Evidence for depression or mood disorders  The patient's weight, height, BMI, and visual acuity have been recorded in the chart.  I have made referrals, counseling, and provided education to the patient based on review of the above and I have provided the patient with a written personalized care plan for preventive services.     Vicie Mutters, PA-C   07/12/2013

## 2013-07-12 NOTE — Patient Instructions (Signed)
Preventative Care for Adults - Female      MAINTAIN REGULAR HEALTH EXAMS:  A routine yearly physical is a good way to check in with your primary care provider about your health and preventive screening. It is also an opportunity to share updates about your health and any concerns you have, and receive a thorough all-over exam.   Most health insurance companies pay for at least some preventative services.  Check with your health plan for specific coverages.  WHAT PREVENTATIVE SERVICES DO WOMEN NEED?  Adult women should have their weight and blood pressure checked regularly.   Women age 35 and older should have their cholesterol levels checked regularly.  Women should be screened for cervical cancer with a Pap smear and pelvic exam beginning at either age 21, or 3 years after they become sexually activity.    Breast cancer screening generally begins at age 40 with a mammogram and breast exam by your primary care provider.    Beginning at age 50 and continuing to age 75, women should be screened for colorectal cancer.  Certain people may need continued testing until age 85.  Updating vaccinations is part of preventative care.  Vaccinations help protect against diseases such as the flu.  Osteoporosis is a disease in which the bones lose minerals and strength as we age. Women ages 65 and over should discuss this with their caregivers, as should women after menopause who have other risk factors.  Lab tests are generally done as part of preventative care to screen for anemia and blood disorders, to screen for problems with the kidneys and liver, to screen for bladder problems, to check blood sugar, and to check your cholesterol level.  Preventative services generally include counseling about diet, exercise, avoiding tobacco, drugs, excessive alcohol consumption, and sexually transmitted infections.    GENERAL RECOMMENDATIONS FOR GOOD HEALTH:  Healthy diet:  Eat a variety of foods, including  fruit, vegetables, animal or vegetable protein, such as meat, fish, chicken, and eggs, or beans, lentils, tofu, and grains, such as rice.  Drink plenty of water daily.  Decrease saturated fat in the diet, avoid lots of red meat, processed foods, sweets, fast foods, and fried foods.  Exercise:  Aerobic exercise helps maintain good heart health. At least 30-40 minutes of moderate-intensity exercise is recommended. For example, a brisk walk that increases your heart rate and breathing. This should be done on most days of the week.   Find a type of exercise or a variety of exercises that you enjoy so that it becomes a part of your daily life.  Examples are running, walking, swimming, water aerobics, and biking.  For motivation and support, explore group exercise such as aerobic class, spin class, Zumba, Yoga,or  martial arts, etc.    Set exercise goals for yourself, such as a certain weight goal, walk or run in a race such as a 5k walk/run.  Speak to your primary care provider about exercise goals.  Disease prevention:  If you smoke or chew tobacco, find out from your caregiver how to quit. It can literally save your life, no matter how long you have been a tobacco user. If you do not use tobacco, never begin.   Maintain a healthy diet and normal weight. Increased weight leads to problems with blood pressure and diabetes.   The Body Mass Index or BMI is a way of measuring how much of your body is fat. Having a BMI above 27 increases the risk of heart disease,   diabetes, hypertension, stroke and other problems related to obesity. Your caregiver can help determine your BMI and based on it develop an exercise and dietary program to help you achieve or maintain this important measurement at a healthful level.  High blood pressure causes heart and blood vessel problems.  Persistent high blood pressure should be treated with medicine if weight loss and exercise do not work.   Fat and cholesterol leaves  deposits in your arteries that can block them. This causes heart disease and vessel disease elsewhere in your body.  If your cholesterol is found to be high, or if you have heart disease or certain other medical conditions, then you may need to have your cholesterol monitored frequently and be treated with medication.   Ask if you should have a cardiac stress test if your history suggests this. A stress test is a test done on a treadmill that looks for heart disease. This test can find disease prior to there being a problem.  Menopause can be associated with physical symptoms and risks. Hormone replacement therapy is available to decrease these. You should talk to your caregiver about whether starting or continuing to take hormones is right for you.   Osteoporosis is a disease in which the bones lose minerals and strength as we age. This can result in serious bone fractures. Risk of osteoporosis can be identified using a bone density scan. Women ages 65 and over should discuss this with their caregivers, as should women after menopause who have other risk factors. Ask your caregiver whether you should be taking a calcium supplement and Vitamin D, to reduce the rate of osteoporosis.   Avoid drinking alcohol in excess (more than two drinks per day).  Avoid use of street drugs. Do not share needles with anyone. Ask for professional help if you need assistance or instructions on stopping the use of alcohol, cigarettes, and/or drugs.  Brush your teeth twice a day with fluoride toothpaste, and floss once a day. Good oral hygiene prevents tooth decay and gum disease. The problems can be painful, unattractive, and can cause other health problems. Visit your dentist for a routine oral and dental check up and preventive care every 6-12 months.   Look at your skin regularly.  Use a mirror to look at your back. Notify your caregivers of changes in moles, especially if there are changes in shapes, colors, a size  larger than a pencil eraser, an irregular border, or development of new moles.  Safety:  Use seatbelts 100% of the time, whether driving or as a passenger.  Use safety devices such as hearing protection if you work in environments with loud noise or significant background noise.  Use safety glasses when doing any work that could send debris in to the eyes.  Use a helmet if you ride a bike or motorcycle.  Use appropriate safety gear for contact sports.  Talk to your caregiver about gun safety.  Use sunscreen with a SPF (or skin protection factor) of 15 or greater.  Lighter skinned people are at a greater risk of skin cancer. Don't forget to also wear sunglasses in order to protect your eyes from too much damaging sunlight. Damaging sunlight can accelerate cataract formation.   Practice safe sex. Use condoms. Condoms are used for birth control and to help reduce the spread of sexually transmitted infections (or STIs).  Some of the STIs are gonorrhea (the clap), chlamydia, syphilis, trichomonas, herpes, HPV (human papilloma virus) and HIV (human immunodeficiency virus)   which causes AIDS. The herpes, HIV and HPV are viral illnesses that have no cure. These can result in disability, cancer and death.   Keep carbon monoxide and smoke detectors in your home functioning at all times. Change the batteries every 6 months or use a model that plugs into the wall.   Vaccinations:  Stay up to date with your tetanus shots and other required immunizations. You should have a booster for tetanus every 10 years. Be sure to get your flu shot every year, since 5%-20% of the [place] population comes down with the flu. The flu vaccine changes each year, so being vaccinated once is not enough. Get your shot in the fall, before the flu season peaks.   Other vaccines to consider:  Human Papilloma Virus or HPV causes cancer of the cervix, and other infections that can be transmitted from person to person. There is a vaccine for  HPV, and females should get immunized between the ages of 11 and 26. It requires a series of 3 shots.   Pneumococcal vaccine to protect against certain types of pneumonia.  This is normally recommended for adults age 65 or older.  However, adults younger than 72 years old with certain underlying conditions such as diabetes, heart or lung disease should also receive the vaccine.  Shingles vaccine to protect against Varicella Zoster if you are older than age 60, or younger than 72 years old with certain underlying illness.  Hepatitis A vaccine to protect against a form of infection of the liver by a virus acquired from food.  Hepatitis B vaccine to protect against a form of infection of the liver by a virus acquired from blood or body fluids, particularly if you work in health care.  If you plan to travel internationally, check with your local health department for specific vaccination recommendations.  Cancer Screening:  Breast cancer screening is essential to preventive care for women. All women age 20 and older should perform a breast self-exam every month. At age 40 and older, women should have their caregiver complete a breast exam each year. Women at ages 40 and older should have a mammogram (x-ray film) of the breasts. Your caregiver can discuss how often you need mammograms.    Cervical cancer screening includes taking a Pap smear (sample of cells examined under a microscope) from the cervix (end of the uterus). It also includes testing for HPV (Human Papilloma Virus, which can cause cervical cancer). Screening and a pelvic exam should begin at age 21, or 3 years after a woman becomes sexually active. Screening should occur every year, with a Pap smear but no HPV testing, up to age 30. After age 30, you should have a Pap smear every 3 years with HPV testing, if no HPV was found previously.   Most routine colon cancer screening begins at the age of 50. On a yearly basis, doctors may provide  special easy to use take-home tests to check for hidden blood in the stool. Sigmoidoscopy or colonoscopy can detect the earliest forms of colon cancer and is life saving. These tests use a small camera at the end of a tube to directly examine the colon. Speak to your caregiver about this at age 50, when routine screening begins (and is repeated every 5 years unless early forms of pre-cancerous polyps or small growths are found).     

## 2013-07-13 LAB — PROTIME-INR
INR: 2.78 — ABNORMAL HIGH (ref <1.50)
Prothrombin Time: 28.6 seconds — ABNORMAL HIGH (ref 11.6–15.2)

## 2013-07-29 ENCOUNTER — Other Ambulatory Visit: Payer: Self-pay | Admitting: Emergency Medicine

## 2013-08-08 ENCOUNTER — Other Ambulatory Visit: Payer: Self-pay | Admitting: Internal Medicine

## 2013-08-09 ENCOUNTER — Other Ambulatory Visit: Payer: Self-pay | Admitting: Emergency Medicine

## 2013-08-12 ENCOUNTER — Other Ambulatory Visit: Payer: Self-pay | Admitting: Internal Medicine

## 2013-08-12 DIAGNOSIS — J209 Acute bronchitis, unspecified: Secondary | ICD-10-CM

## 2013-08-12 MED ORDER — AZITHROMYCIN 250 MG PO TABS: ORAL_TABLET | ORAL | Status: AC

## 2013-08-12 NOTE — Care Management Note (Signed)
Patient called Sun 10:00 am and c/o exacerbation of congestion since midnight & using nebulizer. On Coumadin  - hoarse - has OV in am w/Melissa for coag check.  Advised will call in [place] to start & to continue [facility] w/ipatropium and discuss new Rx to re-try albuterol at 1/2 dose  Coumadin adjustment pending INR in am

## 2013-08-13 ENCOUNTER — Ambulatory Visit (INDEPENDENT_AMBULATORY_CARE_PROVIDER_SITE_OTHER): Payer: Medicare Other | Admitting: Emergency Medicine

## 2013-08-13 ENCOUNTER — Encounter: Payer: Self-pay | Admitting: Emergency Medicine

## 2013-08-13 VITALS — BP 102/58 | HR 68 | Temp 98.2°F | Resp 16 | Ht 63.25 in | Wt 189.0 lb

## 2013-08-13 DIAGNOSIS — I4891 Unspecified atrial fibrillation: Secondary | ICD-10-CM

## 2013-08-13 DIAGNOSIS — R350 Frequency of micturition: Secondary | ICD-10-CM

## 2013-08-13 DIAGNOSIS — Z7901 Long term (current) use of anticoagulants: Secondary | ICD-10-CM

## 2013-08-13 DIAGNOSIS — J209 Acute bronchitis, unspecified: Secondary | ICD-10-CM

## 2013-08-13 DIAGNOSIS — IMO0001 Reserved for inherently not codable concepts without codable children: Secondary | ICD-10-CM

## 2013-08-13 DIAGNOSIS — J45909 Unspecified asthma, uncomplicated: Secondary | ICD-10-CM

## 2013-08-13 LAB — BASIC METABOLIC PANEL WITH GFR
BUN: 27 mg/dL — ABNORMAL HIGH (ref 6–23)
CHLORIDE: 100 meq/L (ref 96–112)
CO2: 28 meq/L (ref 19–32)
CREATININE: 1.23 mg/dL — AB (ref 0.50–1.10)
Calcium: 10.4 mg/dL (ref 8.4–10.5)
GFR, Est African American: 51 mL/min — ABNORMAL LOW
GFR, Est Non African American: 44 mL/min — ABNORMAL LOW
GLUCOSE: 89 mg/dL (ref 70–99)
Potassium: 5 mEq/L (ref 3.5–5.3)
Sodium: 142 mEq/L (ref 135–145)

## 2013-08-13 LAB — CBC WITH DIFFERENTIAL/PLATELET
BASOS ABS: 0.1 10*3/uL (ref 0.0–0.1)
BASOS PCT: 1 % (ref 0–1)
EOS ABS: 0.5 10*3/uL (ref 0.0–0.7)
EOS PCT: 6 % — AB (ref 0–5)
HEMATOCRIT: 44.3 % (ref 36.0–46.0)
Hemoglobin: 15.2 g/dL — ABNORMAL HIGH (ref 12.0–15.0)
LYMPHS PCT: 27 % (ref 12–46)
Lymphs Abs: 2.5 10*3/uL (ref 0.7–4.0)
MCH: 30.2 pg (ref 26.0–34.0)
MCHC: 34.3 g/dL (ref 30.0–36.0)
MCV: 87.9 fL (ref 78.0–100.0)
Monocytes Absolute: 0.9 10*3/uL (ref 0.1–1.0)
Monocytes Relative: 10 % (ref 3–12)
Neutro Abs: 5.1 10*3/uL (ref 1.7–7.7)
Neutrophils Relative %: 56 % (ref 43–77)
Platelets: 230 10*3/uL (ref 150–400)
RBC: 5.04 MIL/uL (ref 3.87–5.11)
RDW: 15.7 % — AB (ref 11.5–15.5)
WBC: 9.1 10*3/uL (ref 4.0–10.5)

## 2013-08-13 LAB — PROTIME-INR
INR: 2.53 — ABNORMAL HIGH (ref <1.50)
Prothrombin Time: 26.6 seconds — ABNORMAL HIGH (ref 11.6–15.2)

## 2013-08-13 MED ORDER — ALBUTEROL SULFATE HFA 108 (90 BASE) MCG/ACT IN AERS: INHALATION_SPRAY | RESPIRATORY_TRACT | Status: DC

## 2013-08-13 MED ORDER — PREDNISONE 10 MG PO TABS: ORAL_TABLET | ORAL | Status: DC

## 2013-08-13 NOTE — Progress Notes (Signed)
Subjective:    Patient ID: Katie [name], female    DOB: 1940/08/04, 73 y.o.   MRN: [id number]  HPI Comments: 73 yo WF female presents  for AFib prophylaxis with Coumadin. She denies bruising/ bleeding or changes with dosing.   She has had vaginal d/c with odor on/off and has not f/u at gyn AD. She has hx of cystocele. She is having more frequency with urine.  She has increased SOB since Saturday, she started ZPAK on Sunday and is still not feeling very well. She has been out of Daliresp for 2 months. She did not get rescue inhaler filled due to cost.     Medication List       This list is accurate as of: 08/13/13  9:58 AM.  Always use your most recent med list.               albuterol 108 (90 BASE) MCG/ACT inhaler  Commonly known as:  PROVENTIL HFA;VENTOLIN HFA  2 puffs 5 minutes apart every 4 to 6 hours as needed to rescue asthma     allopurinol 300 MG tablet  Commonly known as:  ZYLOPRIM  TAKE 1 TABLET BY MOUTH DAILY     aspirin 81 MG tablet  Take 81 mg by mouth daily.     azithromycin 250 MG tablet  Commonly known as:  ZITHROMAX  Take 2 tablets (500 mg) on  Day 1,  followed by 1 tablet (250 mg) once daily on Days 2 through 5.     benazepril 20 MG tablet  Commonly known as:  LOTENSIN  Take 0.5 tablets (10 mg total) by mouth daily.     bisoprolol-hydrochlorothiazide 5-6.25 MG per tablet  Commonly known as:  ZIAC  Take 1 tablet by mouth daily.     DALIRESP 500 MCG Tabs tablet  Generic drug:  roflumilast  Take 500 mcg by mouth daily.     digoxin 0.25 MG tablet  Commonly known as:  LANOXIN  TAKE 1 TABLET BY MOUTH DAILY     furosemide 80 MG tablet  Commonly known as:  LASIX  TAKE 1 TABLET BY MOUTH TWICE A DAY AS NEEDED FOR FLUID     gabapentin 300 MG capsule  Commonly known as:  NEURONTIN  TAKE ONE CAPSULE BY MOUTH 3 TIMES A DAY     ipratropium 0.02 % nebulizer solution  Commonly known as:  ATROVENT  Take 2.5 mLs (0.5 mg total) by nebulization every 4  (four) hours as needed for wheezing or shortness of breath.     levothyroxine 50 MCG tablet  Commonly known as:  SYNTHROID, LEVOTHROID  Take 50 mcg by mouth at bedtime.     loteprednol 0.5 % ophthalmic suspension  Commonly known as:  LOTEMAX  Place 1 drop into both eyes daily.     montelukast 10 MG tablet  Commonly known as:  SINGULAIR  Take 1 tablet (10 mg total) by mouth daily as needed. For allergy     pravastatin 40 MG tablet  Commonly known as:  PRAVACHOL  Take 40 mg by mouth at bedtime.     prochlorperazine 5 MG tablet  Commonly known as:  COMPAZINE  Take 5 mg by mouth every 6 (six) hours as needed. Dizzy     solifenacin 5 MG tablet  Commonly known as:  VESICARE  Take 1 tablet (5 mg total) by mouth daily.     warfarin 2 MG tablet  Commonly known as:  COUMADIN  Take 2-3 mg  by mouth daily. 2 mg Tuesdays and Thursdays and 3 mg M,W,F,S,Sun       Allergies  Allergen Reactions  . Advair Diskus [Fluticasone-Salmeterol] Other (See Comments)    SKIN CHANGES  . Amiodarone Other (See Comments)    PULMONARY TOXICITY  . Codeine     unknown  . Diovan [Valsartan] Other (See Comments)    HYPOTENSION  . Doxycycline Diarrhea and Other (See Comments)    VISUAL DISTURBANCE  . Flexeril [Cyclobenzaprine] Other (See Comments)    FATIGUE  . Keflex [Cephalexin] Diarrhea  . Verapamil Other (See Comments)    EDEMA   Past Medical History  Diagnosis Date  . Atrial fibrillation   . COPD (chronic obstructive pulmonary disease)   . Bronchitis, chronic   . History of Bell's palsy   . Hypertension   . Coronary artery disease   . Migraine headache   . Atrial flutter   . Tobacco abuse   . Osteopenia   . Cervical dysplasia   . Thyroid disease     Hypo  . Elevated cholesterol   . Unspecified vitamin D deficiency   . Family history of anesthesia complication     Hard for daughter to wake up after Anesthesia  . Shortness of breath     with exertion  . Hypothyroidism   .  Cystocele     either on bladder or kidneys patient is unsure but stated physician will watch it  . Seasonal allergies   . Dizziness     If patient gets out of bed too fast  . H/O hiatal hernia   . Rosacea conjunctivitis     Right eye is worse than left eye  . Arthritis   . Gout   . Aortic stenosis     mild AS witm mild to mod AR by 11/2012 echo      Review of Systems  Constitutional: Positive for fatigue.  HENT: Positive for congestion and ear pain.   Respiratory: Positive for cough.   Genitourinary: Positive for frequency and vaginal discharge.  All other systems reviewed and are negative.  BP 102/58  Pulse 68  Temp(Src) 98.2 F (36.8 C) (Temporal)  Resp 16  Ht 5' 3.25" (1.607 m)  Wt 189 lb (85.73 kg)  BMI 33.20 kg/m2  SpO2 97%     Objective:   Physical Exam  Nursing note and vitals reviewed. Constitutional: She is oriented to person, place, and time. She appears well-developed and well-nourished.  HENT:  Head: Normocephalic and atraumatic.  Right Ear: External ear normal.  Left Ear: External ear normal.  Nose: Nose normal.  Mouth/Throat: Oropharynx is clear and moist.  Cerumen impaction  Eyes: Conjunctivae and EOM are normal.  Neck: Normal range of motion.  Cardiovascular: Normal rate, regular rhythm, normal heart sounds and intact distal pulses.   Pulmonary/Chest: Effort normal and breath sounds normal.  Tight/ congested cough  Musculoskeletal: Normal range of motion.  Lymphadenopathy:    She has no cervical adenopathy.  Neurological: She is alert and oriented to person, place, and time.  Skin: Skin is warm and dry.  Psychiatric: She has a normal mood and affect. Judgment normal.          Assessment & Plan:  1. Afib prophylaxis- Check labs, Call office with any unusual bleeding/ bruising/ weakness   2. ? Bronchitis vs COPD vs Allergic rhinitis- Allegra OTC, increase H2o, allergy hygiene explained. Add NAsonex 1 spray ea sx x #1 AD. Decrease to 1/2  duoneb TID, Pred  DP 10 mg AD, Continue ABX AD, Albuterol HFA AD, Restart Daliresp SX x ^ boxes.  3. Urine frequency- check labs  4. ? Vaginal d/c- ADVISED AGAIN needs to schedule with GYN for evaluation with HX of Cystocele  5. Cerumen impaction- advised of OTC home hygiene x 3 days then f/u for lavage

## 2013-08-13 NOTE — Patient Instructions (Signed)
Bronchitis °Bronchitis is swelling (inflammation) of the air tubes leading to your lungs (bronchi). This causes mucus and a cough. If the swelling gets bad, you may have trouble breathing. °HOME CARE  °· Rest. °· Drink enough fluids to keep your pee (urine) clear or pale yellow (unless you have a condition where you have to watch how much you drink). °· Only take medicine as told by your doctor. If you were given antibiotic medicines, finish them even if you start to feel better. °· Avoid smoke, irritating chemicals, and strong smells. These make the problem worse. Quit smoking if you smoke. This helps your lungs heal faster. °· Use a cool mist humidifier. Change the water in the humidifier every day. You can also sit in the bathroom with hot shower running for 5 10 minutes. Keep the door closed. °· See your health care provider as told. °· Wash your hands often. °GET HELP IF: °Your problems do not get better after 1 week. °GET HELP RIGHT AWAY IF:  °· Your fever gets worse. °· You have chills. °· Your chest hurts. °· Your problems breathing get worse. °· You have blood in your mucus. °· You pass out (faint). °· You feel lightheaded. °· You have a bad headache. °· You throw up (vomit) again and again. °MAKE SURE YOU: °· Understand these instructions. °· Will watch your condition. °· Will get help right away if you are not doing well or get worse. °Document Released: 08/18/2007 Document Revised: 12/20/2012 Document Reviewed: 10/24/2012 °[employer]® Patient Information ©2014 [employer]. ° °

## 2013-08-14 LAB — URINE CULTURE
COLONY COUNT: NO GROWTH
Organism ID, Bacteria: NO GROWTH

## 2013-08-14 LAB — DIGOXIN LEVEL: DIGOXIN LVL: 1.2 ng/mL (ref 0.8–2.0)

## 2013-08-14 LAB — URINALYSIS, MICROSCOPIC ONLY
Bacteria, UA: NONE SEEN
CRYSTALS: NONE SEEN
Casts: NONE SEEN
Squamous Epithelial / LPF: NONE SEEN

## 2013-08-14 LAB — URINALYSIS, ROUTINE W REFLEX MICROSCOPIC
BILIRUBIN URINE: NEGATIVE
Glucose, UA: NEGATIVE mg/dL
Hgb urine dipstick: NEGATIVE
Ketones, ur: NEGATIVE mg/dL
NITRITE: NEGATIVE
Protein, ur: NEGATIVE mg/dL
SPECIFIC GRAVITY, URINE: 1.007 (ref 1.005–1.030)
UROBILINOGEN UA: 0.2 mg/dL (ref 0.0–1.0)
pH: 6 (ref 5.0–8.0)

## 2013-08-15 ENCOUNTER — Ambulatory Visit (INDEPENDENT_AMBULATORY_CARE_PROVIDER_SITE_OTHER): Payer: Medicare Other | Admitting: *Deleted

## 2013-08-15 ENCOUNTER — Encounter: Payer: Self-pay | Admitting: Gynecology

## 2013-08-15 DIAGNOSIS — H6120 Impacted cerumen, unspecified ear: Secondary | ICD-10-CM

## 2013-08-15 NOTE — Progress Notes (Signed)
Patient ID: Katie [name], female   DOB: Mar 03, 1941, 73 y.o.   MRN: [id number] Patient presents for bilateral ear lavage to help relieve cerumen impaction.  Successful bilateral clearing of cerumen.

## 2013-08-20 ENCOUNTER — Telehealth: Payer: Self-pay | Admitting: *Deleted

## 2013-08-20 ENCOUNTER — Encounter: Payer: Self-pay | Admitting: Gynecology

## 2013-08-20 NOTE — Telephone Encounter (Signed)
BMD test normal.

## 2013-08-20 NOTE — Telephone Encounter (Signed)
Patient having breast biopsy and Jessica from [place] called to get OK to stop Warfarin for 4 days prior to biopsy.  OK per Dr Melford Aase and  [facility] aware.

## 2013-08-21 ENCOUNTER — Encounter: Payer: Self-pay | Admitting: Gynecology

## 2013-08-27 ENCOUNTER — Other Ambulatory Visit: Payer: Self-pay | Admitting: Radiology

## 2013-08-27 ENCOUNTER — Telehealth: Payer: Self-pay | Admitting: *Deleted

## 2013-08-27 NOTE — Telephone Encounter (Signed)
Patient had a biopsy this AM and asked when to restart Coumadin.  Per Dr Melford Aase, she can restart Coumadin this PM.

## 2013-08-28 ENCOUNTER — Ambulatory Visit (INDEPENDENT_AMBULATORY_CARE_PROVIDER_SITE_OTHER): Payer: Medicare Other | Admitting: Gynecology

## 2013-08-28 ENCOUNTER — Encounter: Payer: Self-pay | Admitting: Gynecology

## 2013-08-28 VITALS — BP 126/72 | Ht 63.0 in | Wt 181.0 lb

## 2013-08-28 DIAGNOSIS — Z8639 Personal history of other endocrine, nutritional and metabolic disease: Secondary | ICD-10-CM

## 2013-08-28 DIAGNOSIS — B373 Candidiasis of vulva and vagina: Secondary | ICD-10-CM

## 2013-08-28 DIAGNOSIS — B3731 Acute candidiasis of vulva and vagina: Secondary | ICD-10-CM

## 2013-08-28 DIAGNOSIS — N949 Unspecified condition associated with female genital organs and menstrual cycle: Secondary | ICD-10-CM

## 2013-08-28 DIAGNOSIS — M949 Disorder of cartilage, unspecified: Secondary | ICD-10-CM

## 2013-08-28 DIAGNOSIS — N952 Postmenopausal atrophic vaginitis: Secondary | ICD-10-CM

## 2013-08-28 DIAGNOSIS — N898 Other specified noninflammatory disorders of vagina: Secondary | ICD-10-CM

## 2013-08-28 DIAGNOSIS — M899 Disorder of bone, unspecified: Secondary | ICD-10-CM

## 2013-08-28 DIAGNOSIS — M8580 Other specified disorders of bone density and structure, unspecified site: Secondary | ICD-10-CM

## 2013-08-28 LAB — WET PREP FOR TRICH, YEAST, CLUE
Clue Cells Wet Prep HPF POC: NONE SEEN
Trich, Wet Prep: NONE SEEN

## 2013-08-28 MED ORDER — FLUCONAZOLE 150 MG PO TABS: 150.0000 mg | ORAL_TABLET | Freq: Once | ORAL | Status: DC

## 2013-08-28 NOTE — Patient Instructions (Signed)
Monilial Vaginitis  Vaginitis in a soreness, swelling and redness (inflammation) of the vagina and vulva. Monilial vaginitis is not a sexually transmitted infection.  CAUSES   Yeast vaginitis is caused by yeast (candida) that is normally found in your vagina. With a yeast infection, the candida has overgrown in number to a point that upsets the chemical balance.  SYMPTOMS   · White, thick vaginal discharge.  · Swelling, itching, redness and irritation of the vagina and possibly the lips of the vagina (vulva).  · Burning or painful urination.  · Painful intercourse.  DIAGNOSIS   Things that may contribute to monilial vaginitis are:  · Postmenopausal and virginal states.  · Pregnancy.  · Infections.  · Being tired, sick or stressed, especially if you had monilial vaginitis in the past.  · Diabetes. Good control will help lower the chance.  · Birth control pills.  · Tight fitting garments.  · Using bubble bath, feminine sprays, douches or deodorant tampons.  · Taking certain medications that kill germs (antibiotics).  · Sporadic recurrence can occur if you become ill.  TREATMENT   Your caregiver will give you medication.  · There are several kinds of anti monilial vaginal creams and suppositories specific for monilial vaginitis. For recurrent yeast infections, use a suppository or cream in the vagina 2 times a week, or as directed.  · Anti-monilial or steroid cream for the itching or irritation of the vulva may also be used. Get your caregiver's permission.  · Painting the vagina with methylene blue solution may help if the monilial cream does not work.  · Eating yogurt may help prevent monilial vaginitis.  HOME CARE INSTRUCTIONS   · Finish all medication as prescribed.  · Do not have sex until treatment is completed or after your caregiver tells you it is okay.  · Take warm sitz baths.  · Do not douche.  · Do not use tampons, especially scented ones.  · Wear cotton underwear.  · Avoid tight pants and panty  hose.  · Tell your sexual partner that you have a yeast infection. They should go to their caregiver if they have symptoms such as mild rash or itching.  · Your sexual partner should be treated as well if your infection is difficult to eliminate.  · Practice safer sex. Use condoms.  · Some vaginal medications cause latex condoms to fail. Vaginal medications that harm condoms are:  · Cleocin cream.  · Butoconazole (Femstat®).  · Terconazole (Terazol®) vaginal suppository.  · Miconazole (Monistat®) (may be purchased over the counter).  SEEK MEDICAL CARE IF:   · You have a temperature by mouth above 102° F (38.9° C).  · The infection is getting worse after 2 days of treatment.  · The infection is not getting better after 3 days of treatment.  · You develop blisters in or around your vagina.  · You develop vaginal bleeding, and it is not your menstrual period.  · You have pain when you urinate.  · You develop intestinal problems.  · You have pain with sexual intercourse.  Document Released: 12/09/2004 Document Revised: 05/24/2011 Document Reviewed: 08/23/2008  ExitCare® Patient Information ©2014 [employer].

## 2013-08-28 NOTE — Progress Notes (Signed)
Katie [name] 02-Feb-1941 [id number]   History:    73 y.o.  for her GYN exam and followup. Patient complaining of vaginal odor and discharge minimal pruritus. She remains on Coumadin for coronary artery disease with atrial fibrillation.She is osteopenia without an elevated FRAX risk. Patient's last bone density study with this year with the lowest T score of -1.1 at the left femoral neck and stable. She takes calcium and vitamin D and has not had a fracture. She does have a cystocele and does have some urinary urgency without incontinence. She will occasionally have nocturia x1.She has infrequent intercourse without problems. 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 In June of 2013 she had an ultrasound because of some intermittent postmenopausal spotting while on Coumadin. She takes no hormone replacement therapy. That ultrasound demonstrated that her uterus appears normal with a thin echogenic endometrial echo of 2.6 mm. Both ovaries are atrophic. Her cul-de-sac is free of fluid.patient denies any further vaginal bleeding since then.the patient's last colonoscopy report to be normal in 2004 she will make contact with her gastroenterologist for a followup screening. Her primary physician gives with Hemoccult cards.  Patient had stereotactic biopsy of the left breast yesterday with the following pathology report: Breast, left, needle core biopsy, lower inner quadrant - PSEUDOANGIOMATOUS STROMAL HYPERPLASIA ([place]). - FIBROCYSTIC CHANGES. - THERE IS NO EVIDENCE OF MALIGNANCY.  Patients shingles and Tdap vaccines are up-to-date. Her PCP is Dr. Melford Aase who has been doing her blood work. Past medical history,surgical history, family history and social history were all reviewed and documented in the EPIC chart.  Gynecologic History No LMP recorded. Patient is postmenopausal. Contraception: post menopausal status Last Pap: 2012. Results were: normal Last mammogram: See above.  Results were: See above  Obstetric History OB History  Gravida Para Term Preterm AB SAB TAB Ectopic Multiple Living  4 3 3  1     3     # Outcome Date GA Lbr Len/2nd Weight Sex Delivery Anes PTL Lv  4 ABT           3 TRM           2 TRM           1 TRM                ROS: A ROS was performed and pertinent positives and negatives are included in the history.  GENERAL: No fevers or chills. HEENT: No change in vision, no earache, sore throat or sinus congestion. NECK: No pain or stiffness. CARDIOVASCULAR: No chest pain or pressure. No palpitations. PULMONARY: No shortness of breath, cough or wheeze. GASTROINTESTINAL: No abdominal pain, nausea, vomiting or diarrhea, melena or bright red blood per rectum. GENITOURINARY: No urinary frequency, urgency, hesitancy or dysuria. MUSCULOSKELETAL: No joint or muscle pain, no back pain, no recent trauma. DERMATOLOGIC: No rash, no itching, no lesions. ENDOCRINE: No polyuria, polydipsia, no heat or cold intolerance. No recent change in weight. HEMATOLOGICAL: No anemia or easy bruising or bleeding. NEUROLOGIC: No headache, seizures, numbness, tingling or weakness. PSYCHIATRIC: No depression, no loss of interest in normal activity or change in sleep pattern.     Exam: chaperone present  BP 126/72  Ht 5\' 3"  (1.6 m)  Wt 181 lb (82.101 kg)  BMI 32.07 kg/m2  Body mass index is 32.07 kg/(m^2).  General appearance : Well developed well nourished female. No acute distress HEENT: Neck supple, trachea midline, no carotid bruits, no thyroidmegaly Lungs: Clear to  auscultation, no rhonchi or wheezes, or rib retractions  Heart: Regular rate and rhythm, no murmurs or gallops Breast:Examined in sitting and supine position were symmetrical in appearance, no palpable masses or tenderness,  no skin retraction, no nipple inversion, no nipple discharge, no skin discoloration, no axillary or supraclavicular lymphadenopathy Abdomen: no palpable masses or tenderness, no  rebound or guarding Extremities: no edema or skin discoloration or tenderness  Pelvic:  Bartholin, Urethra, Skene Glands: Within normal limits             Vagina: No gross lesions or discharge, first degree cystocele vaginal atrophy  Cervix: No gross lesions or discharge  Uterus  axial, normal size, shape and consistency, non-tender and mobile  Adnexa  Without masses or tenderness  Anus and perineum  normal   Rectovaginal  normal sphincter tone without palpated masses or tenderness             Hemoccult PCP provides   Wet prep: Few yeast  Assessment/Plan:  73 y.o. female with evidence of monilial vaginitis prescribe Diflucan 150 mg one by mouth. She was also provided with samples of  Rephresh probiotic gel that she can use twice a week when necessary. Blood work will be drawn by her PCP. She was reminded once again that she needs to schedule her overdue colonoscopy and she says she will do so this year. We discussed importance of calcium vitamin D and regular exercise for osteoporosis prevention. Pap smear not done today in accordance to the new guidelines. We discussed and went over her bone density study which appears to be stable. Note: This dictation was prepared with  Dragon/digital dictation along withSmart phrase technology. Any transcriptional errors that result from this process are unintentional.   Terrance Mass MD, 10:27 AM 08/28/2013

## 2013-09-05 ENCOUNTER — Other Ambulatory Visit: Payer: Self-pay | Admitting: Internal Medicine

## 2013-09-06 ENCOUNTER — Encounter: Payer: Self-pay | Admitting: Gynecology

## 2013-09-11 ENCOUNTER — Other Ambulatory Visit: Payer: Self-pay | Admitting: Internal Medicine

## 2013-09-11 ENCOUNTER — Other Ambulatory Visit: Payer: Self-pay | Admitting: Emergency Medicine

## 2013-09-12 ENCOUNTER — Ambulatory Visit: Payer: Self-pay | Admitting: Internal Medicine

## 2013-09-20 ENCOUNTER — Ambulatory Visit (INDEPENDENT_AMBULATORY_CARE_PROVIDER_SITE_OTHER): Payer: Medicare Other | Admitting: Internal Medicine

## 2013-09-20 ENCOUNTER — Encounter: Payer: Self-pay | Admitting: Internal Medicine

## 2013-09-20 VITALS — BP 126/80 | HR 68 | Temp 97.7°F | Resp 16 | Ht 63.75 in | Wt 184.0 lb

## 2013-09-20 DIAGNOSIS — I482 Chronic atrial fibrillation, unspecified: Secondary | ICD-10-CM

## 2013-09-20 DIAGNOSIS — I10 Essential (primary) hypertension: Secondary | ICD-10-CM

## 2013-09-20 DIAGNOSIS — Z7901 Long term (current) use of anticoagulants: Secondary | ICD-10-CM

## 2013-09-20 DIAGNOSIS — I4891 Unspecified atrial fibrillation: Secondary | ICD-10-CM

## 2013-09-20 DIAGNOSIS — I70219 Atherosclerosis of native arteries of extremities with intermittent claudication, unspecified extremity: Secondary | ICD-10-CM

## 2013-09-20 MED ORDER — FLUCONAZOLE 150 MG PO TABS: 150.0000 mg | ORAL_TABLET | Freq: Every day | ORAL | Status: DC

## 2013-09-20 NOTE — Progress Notes (Signed)
Patient ID: Katie [name], female   DOB: [birth year], 73 y.o.   MRN: [id number]  This very nice 73 yo MWF is on Coumadin for ch Afib. Patient is also followed for Hypertension, COPD, Hyperlipidemia, Pre-Diabetes and Vitamin D Deficiency Patient's last INR was  Lab Results  Component Value Date   INR 2.53* 08/13/2013   INR 2.78* 07/12/2013   INR 2.36* 06/08/2013    Patient denies SOB, CP, dizziness, nose bleeds, easy bleeding, and blood in stool/urine.    Other problems include recent c/o "heavy feeling " in the right leg precipitated by walking and relieved with rest. Patient is s/p AAA/Stent graft in Jan 2015 by Dr Kellie Simmering.  Medication Sig  . albuterol HFA  inhaler 2 puffs 5 minutes apart every 4 hours as needed  . allopurinol  300 MG tab TAKE 1 TABLET BY MOUTH DAILY  . allopurinol  300 MG tab TAKE 1 TABLET BY MOUTH DAILY  . aspirin 81 MG ta Take 81 mg by mouth daily.    . benazepril  20 MG tablet Take 0.5 tablets by mouth daily.  . bisoprolol-hctz  5-6.25 TAKE 1 TABLET BY MOUTH DAILY  . digoxin  0.25 MG tab TAKE 1 TABLET DAILY  . DIFLUCAN 150 MG tab Take 1 tab once.  . furosemide  80 MG tab TAKE 1 TAB TWICE A DAY   . gabapentin 300 MG cap TAKE ONE CAPSULE BY MOUTH 3 TIMES A DAY  . ipratropium 0.02 % neb soln Take 2.5 mLs  by neb every 4 hours as needed   . levothyroxine  50 MCG tab TAKE 1 TABLET EVERY DAY  . LOTEMAX) 0.5 % oph susp Place 1 drop into both eyes daily.  . montelukast  10 MG  Take 1 tablet (10 mg total) by mouth daily as needed. For allergy  . pravastatin  40 MG tab Take 40 mg by mouth at bedtime.    . COMPAZINE 5 MG tab Take 5 mg by mouth every 6 (six) hours as needed. Dizzy  . DALIRESP 500 MCG TAB Take 500 mcg by mouth daily.  Marland Kitchen warfarin 2 MG tablet 2 mg TTh and 3 mg MWFSS   Allergies  Allergen Reactions  . Advair Diskus [Fluticasone-Salmeterol] Other (See Comments)    SKIN CHANGES  . Amiodarone Other (See Comments)    PULMONARY TOXICITY  . Codeine     unknown  .  Diovan [Valsartan] Other (See Comments)    HYPOTENSION  . Doxycycline Diarrhea and Other (See Comments)    VISUAL DISTURBANCE  . Flexeril [Cyclobenzaprine] Other (See Comments)    FATIGUE  . Keflex [Cephalexin] Diarrhea  . Verapamil Other (See Comments)    EDEMA   Past Medical History  Diagnosis Date  . Atrial fibrillation   . COPD (chronic obstructive pulmonary disease)   . Bronchitis, chronic   . History of Bell's palsy   . Hypertension   . Coronary artery disease   . Migraine headache   . Atrial flutter   . Tobacco abuse   . Osteopenia   . Cervical dysplasia   . Thyroid disease     Hypo  . Elevated cholesterol   . Unspecified vitamin D deficiency   . Family history of anesthesia complication     Hard for daughter to wake up after Anesthesia  . Shortness of breath     with exertion  . Hypothyroidism   . Cystocele     either on bladder or kidneys patient is unsure  but stated physician will watch it  . Seasonal allergies   . Dizziness     If patient gets out of bed too fast  . H/O hiatal hernia   . Rosacea conjunctivitis     Right eye is worse than left eye  . Arthritis   . Gout   . Aortic stenosis     mild AS witm mild to mod AR by 11/2012 echo    FHx/SHx - reviewed/unchanged  Systems Review Constitutional: Denies fever, chills, headaches, fatigue. Cardio: Denies chest pain, palpitations, irregular heartbeat, syncope, dyspnea, diaphoresis, orthopnea, PND, claudication, or edema. Respiratory: no cough, congestion, sputum, dyspnea, pleurisy, hoarseness, laryngitis, wheezing.  Gastrointestinal: Denies dysphagia, heartburn, reflux, pain, cramps, nausea, diarrhea, constipation, hematemesis, melena, hematochezia. Genitourinary: Denies dysuria, frequency, hematuria, flank pain. Musculoskeletal: Denies arthralgia, myalgia, stiffness, Jt. Swelling, pain, limp, strain/sprain. Skin: Denies rash, excessive bleeding or bruising. Neuro: Weakness, tremor, incoordination,  spasms, paresthesia, pain Heme/Lymph: Excessive bleeding, bruising, enlarged lymph nodes  Examination:  BP 126/80  Pulse 68  Temp 97.7 F  Resp 16  Ht 5' 3.75"   Wt 184 lb   BMI 31.84 kg/m2  General Appearance: A & oriented. Well nourished, in distress. HEENT: Normal & unremarkable Neck: Supple - Carotids 2+/2+ & thyroid normal. No bruits, nodes or JVD. Respiratory: BS equal & clear without rales, rhonci, or wheezing. Cardio: Heart sounds- irregular rate and rhythm without murmurs, rubs or gallops. Pedal pulses not palpable Abdomen: Soft, benign w/o masses, tenderness, guarding, rebound, hernias, or organomegaly.  Skin: Warm, dry without rashes, lesions, ecchymosis.  MS: Gait & station nl. Neuro: Grossly WNL  Dx   1- HTN 2- ASCAD/ChAfib 3- AAA s/p  Stent 4- ASPVD / claudication - new - RLE  Plan - Warfarin same pending labs.  - advise patient to contact Dr Evelena Leyden office for earlier visit to eval claudication    Chronic anticoagulation- check PT/INR. Discussed med & diet effect/SE. Patient understands to call the office before starting a new medication. F/U 1 mo.

## 2013-09-20 NOTE — Patient Instructions (Signed)

## 2013-09-21 ENCOUNTER — Telehealth: Payer: Self-pay

## 2013-09-21 DIAGNOSIS — I739 Peripheral vascular disease, unspecified: Secondary | ICD-10-CM

## 2013-09-21 LAB — PROTIME-INR
INR: 3.38 — AB (ref <1.50)
Prothrombin Time: 34.2 seconds — ABNORMAL HIGH (ref 11.6–15.2)

## 2013-09-21 NOTE — Telephone Encounter (Signed)
Phone call from pt.  C/o "heaviness" of right leg and hip area with walking; stated the "discomfort goes from my calf to the my hip."  Reported the heaviness has been present since late March or early April of 2015.  Denies any rest pain or open sores on legs/ feet.  Was eval. recently by PCP, and advised to contact Dr. Kellie Simmering for evaluation of symptoms.  Advised will schedule appt. and notify pt.

## 2013-09-24 NOTE — Telephone Encounter (Signed)
Tried to call patient and lv msg as instructed, however an answering service never picked up. Will try again at later time, dpm

## 2013-09-25 ENCOUNTER — Ambulatory Visit: Payer: Medicare Other | Admitting: Family

## 2013-09-25 ENCOUNTER — Encounter (HOSPITAL_COMMUNITY): Payer: Medicare Other

## 2013-09-25 ENCOUNTER — Other Ambulatory Visit: Payer: Self-pay | Admitting: Internal Medicine

## 2013-09-25 DIAGNOSIS — I4891 Unspecified atrial fibrillation: Secondary | ICD-10-CM

## 2013-09-26 ENCOUNTER — Ambulatory Visit (INDEPENDENT_AMBULATORY_CARE_PROVIDER_SITE_OTHER): Payer: Medicare Other | Admitting: *Deleted

## 2013-09-26 DIAGNOSIS — I4891 Unspecified atrial fibrillation: Secondary | ICD-10-CM

## 2013-09-26 LAB — PROTIME-INR
INR: 2.24 — ABNORMAL HIGH (ref <1.50)
Prothrombin Time: 24.8 seconds — ABNORMAL HIGH (ref 11.6–15.2)

## 2013-09-26 NOTE — Progress Notes (Signed)
Patient ID: Katie [name], female   DOB: [birth year], 73 y.o.   MRN: [id number] Patient returns for PROTIME/INR.  Per Dr Melford Aase, patient stopped Coumadin x 3 days.  She restarted 2 mg om 09/25/2013 and 3 mg today.  The dosing schedule is 3 mg on M,W,F and 2 mg on the other 4 days.

## 2013-10-10 ENCOUNTER — Ambulatory Visit (INDEPENDENT_AMBULATORY_CARE_PROVIDER_SITE_OTHER): Payer: Medicare Other | Admitting: *Deleted

## 2013-10-10 DIAGNOSIS — Z7901 Long term (current) use of anticoagulants: Secondary | ICD-10-CM

## 2013-10-10 LAB — PROTIME-INR
INR: >10 (ref <1.50)
Prothrombin Time: >90 seconds — ABNORMAL HIGH (ref 11.6–15.2)

## 2013-10-10 NOTE — Progress Notes (Signed)
Patient ID: Katie [name], female   DOB: [birth year], 73 y.o.   MRN: [id number] Patient presents for 2 week recheck PT/INR.  Currently taking 3 mg times 5 days and 2 mg times 2 days per week.

## 2013-10-11 ENCOUNTER — Other Ambulatory Visit: Payer: Self-pay | Admitting: Internal Medicine

## 2013-10-11 ENCOUNTER — Ambulatory Visit (INDEPENDENT_AMBULATORY_CARE_PROVIDER_SITE_OTHER): Payer: Medicare Other | Admitting: *Deleted

## 2013-10-11 ENCOUNTER — Telehealth: Payer: Self-pay | Admitting: *Deleted

## 2013-10-11 DIAGNOSIS — Z7901 Long term (current) use of anticoagulants: Secondary | ICD-10-CM

## 2013-10-11 DIAGNOSIS — I482 Chronic atrial fibrillation, unspecified: Secondary | ICD-10-CM

## 2013-10-11 LAB — PROTIME-INR: INR: >10 (ref <1.50)

## 2013-10-11 NOTE — Telephone Encounter (Signed)
Called patient regarding critical PT/INR results.  Per Dr Melford Aase, stay off Coumadin and recheck PT/INR on 10/15/2013.

## 2013-10-15 ENCOUNTER — Other Ambulatory Visit: Payer: Self-pay | Admitting: Vascular Surgery

## 2013-10-15 ENCOUNTER — Encounter: Payer: Self-pay | Admitting: Vascular Surgery

## 2013-10-15 ENCOUNTER — Telehealth: Payer: Self-pay | Admitting: *Deleted

## 2013-10-15 ENCOUNTER — Ambulatory Visit (INDEPENDENT_AMBULATORY_CARE_PROVIDER_SITE_OTHER): Payer: Medicare Other

## 2013-10-15 DIAGNOSIS — Z79899 Other long term (current) drug therapy: Secondary | ICD-10-CM

## 2013-10-15 LAB — PROTIME-INR
INR: 3.05 — ABNORMAL HIGH (ref <1.50)
Prothrombin Time: 31.8 seconds — ABNORMAL HIGH (ref 11.6–15.2)

## 2013-10-15 NOTE — Progress Notes (Signed)
Patient ID: Katie [name], female   DOB: 1940-05-28, 73 y.o.   MRN: [id number] Patient has been off Coumadin as directed since 10/11/2013

## 2013-10-15 NOTE — Telephone Encounter (Signed)
Called patient with protime.  Per Dr Melford Aase , stay off Coumadin and recheck Protime on 10/18/2013.

## 2013-10-16 ENCOUNTER — Encounter: Payer: Self-pay | Admitting: Vascular Surgery

## 2013-10-16 ENCOUNTER — Ambulatory Visit (INDEPENDENT_AMBULATORY_CARE_PROVIDER_SITE_OTHER): Payer: Medicare Other | Admitting: Vascular Surgery

## 2013-10-16 ENCOUNTER — Ambulatory Visit (HOSPITAL_COMMUNITY)
Admission: RE | Admit: 2013-10-16 | Discharge: 2013-10-16 | Disposition: A | Discharge status: Home / Self Care | Payer: Medicare Other | Source: Ambulatory Visit | Attending: Vascular Surgery | Admitting: Vascular Surgery

## 2013-10-16 ENCOUNTER — Ambulatory Visit
Admission: RE | Admit: 2013-10-16 | Discharge: 2013-10-16 | Disposition: A | Discharge status: Home / Self Care | Payer: Medicare Other | Source: Ambulatory Visit | Attending: Vascular Surgery | Admitting: Vascular Surgery

## 2013-10-16 VITALS — BP 112/54 | HR 83 | Ht 63.75 in | Wt 179.8 lb

## 2013-10-16 DIAGNOSIS — Z48812 Encounter for surgical aftercare following surgery on the circulatory system: Secondary | ICD-10-CM

## 2013-10-16 DIAGNOSIS — I739 Peripheral vascular disease, unspecified: Secondary | ICD-10-CM

## 2013-10-16 DIAGNOSIS — I714 Abdominal aortic aneurysm, without rupture, unspecified: Secondary | ICD-10-CM

## 2013-10-16 LAB — BUN: BUN: 14 mg/dL (ref 6–23)

## 2013-10-16 LAB — CREATININE, SERUM: Creat: 1.07 mg/dL (ref 0.50–1.10)

## 2013-10-16 MED ORDER — IOHEXOL 350 MG/ML SOLN
80.0000 mL | Freq: Once | INTRAVENOUS | Status: AC | PRN
  Administered 2013-10-16: 80 mL via INTRAVENOUS

## 2013-10-16 NOTE — Progress Notes (Signed)
Subjective:     Patient ID: Katie [name], female   DOB: May 15, 1940, 73 y.o.   MRN: [id number]  HPI this 73 year old female returns for continued followup regarding her abdominal aortic aneurysm stent graft repair which I performed 04/11/2013. Patient required exposure of the right common femoral artery with extensive endarterectomy and repair because of diffuse atherosclerosis in the common femoral artery. She has done well since her surgery but over the past 3 months has developed significant claudication type symptoms in the right calf and thigh. She states she is able to ambulate about 1/2 mile. The symptoms begin in the right calf extending up into the medial thigh. She has no rest pain or history of nonhealing ulcers and no symptoms in the left leg.   Review of Systems denies chest pain, dyspnea on exertion, PND, orthopnea. All systems negative complete review of systems     Objective:   Physical Exam BP 112/54  Pulse 83  Ht 5' 3.75" (1.619 m)  Wt 179 lb 12.8 oz (81.557 kg)  BMI 31.11 kg/m2  SpO2 98%  Gen.-alert and oriented x3 in no apparent distress HEENT normal for age Lungs no rhonchi or wheezing Cardiovascular regular rhythm no murmurs carotid pulses 3+ palpable no bruits audible Abdomen soft nontender no palpable masses Musculoskeletal free of  major deformities Skin clear -no rashes Neurologic normal Lower extremities 2+ femoral and dorsalis pedis pulses palpable bilaterally with no edema  Today our CT angiogram of abdomen and pelvis which are reviewed by computer. There is no endoleak and the aneurysm sac diameter has decreased to 3.6 cm in maximum diameter down from 5.1 cm. Graft is in good position. 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 I also ordered lower family ABIs. This is normal bilaterally with good triphasic flow. 1.05 index on the right and 0.97 on the left.       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 If symptoms worsen patient will need angiography to see if stenting of right external iliac artery would be feasible. If symptoms worsen in the meantime she will be in touch with [place]

## 2013-10-16 NOTE — Addendum Note (Signed)
Addended by: Mena Goes on: 10/16/2013 01:46 PM   Modules accepted: Orders

## 2013-10-18 ENCOUNTER — Ambulatory Visit (INDEPENDENT_AMBULATORY_CARE_PROVIDER_SITE_OTHER): Payer: Medicare Other | Admitting: *Deleted

## 2013-10-18 DIAGNOSIS — Z7901 Long term (current) use of anticoagulants: Secondary | ICD-10-CM

## 2013-10-18 LAB — PROTIME-INR
INR: 1.89 — ABNORMAL HIGH (ref <1.50)
PROTHROMBIN TIME: 21.7 s — AB (ref 11.6–15.2)

## 2013-10-18 NOTE — Progress Notes (Signed)
Patient ID: Katie [name], female   DOB: 23-Feb-1941, 73 y.o.   MRN: [id number] Patient presents for recheck PT/INR labs today.  Patient states she is not currently taking any Coumadin Rx as directed by [facility] since last labs on 10/15/13.

## 2013-11-04 ENCOUNTER — Encounter: Payer: Self-pay | Admitting: Internal Medicine

## 2013-11-04 NOTE — Patient Instructions (Signed)

## 2013-11-04 NOTE — Progress Notes (Signed)
Patient ID: Katie [name], female   DOB: 06/09/1940, 73 y.o.   MRN: [id number]   Annual Screening Comprehensive Examination  This very nice 73 y.o.female presents for complete physical.  Patient has been followed for HTN, ASHD w/ChAfib, Stage 3 CKD Prediabetes, Hyperlipidemia, and Vitamin D Deficiency.    HTN predates since 1999. Patient's BP has been controlled at home. Patient has ChAfib and is o n maintenance with Coumadin.  Patient had a negative Lexiscan in 2014. Patient denies any cardiac symptoms as chest pain, palpitations, shortness of breath, dizziness or ankle swelling. Today's BP: 118/72 mmHg.    Patient's hyperlipidemia is controlled near goal with diet and medications. Patient denies myalgias or other medication SE's. Last lipids were almost at goal with Cholesterol 183; HDL  37*; LDL 101*; Triglycerides 226 on  06/08/2013.   Patient has prediabetes predating since Mar 2013 with an A1c 6.1% and patient denies reactive hypoglycemic symptoms, visual blurring, diabetic polys, or paresthesias. Last A1c was  5.5% on 06/08/2013.   Finally, patient has history of Vitamin D Deficiency and last Vitamin D was 42 on 06/08/2013.  Medication Sig  . albuterol (PROVENTIL HFA;VENTOLIN HFA) 108 (90 BASE) MCG/ACT inhaler 2 puffs 5 minutes apart every 4 to 6 hours as needed to rescue asthma  . allopurinol (ZYLOPRIM) 300 MG tablet TAKE 1 TABLET BY MOUTH DAILY  . aspirin 81 MG tab Take 81 mg by mouth daily.    Marland Kitchen LOTENSIN 20 MG tab Take 0.5 tablets (10 mg total) by mouth daily.  . bisoprolol-hctz5-6.25 MG TAKE 1 TABLET BY MOUTH DAILY  . digoxin  0.25 MG tab TAKE 1 TABLET BY MOUTH DAILY  . furosemide 80 MG tab TAKE 1 TABLET BY MOUTH TWICE A DAY   . gabapentin 300 MG cap TAKE ONE CAPSULE BY MOUTH 3 TIMES A DAY  . ATROVENT nebulizer solution Take 2.5 mLs (0.5 mg total) by neb every 4 hours as needed  . levothyroxine  50 MCG tab TAKE 1 TABLET EVERY DAY  . LOTEMAX 0.5 % ophth susp Place 1 drop into both  eyes daily.  . montelukast  10 MG tab Take 1 tablet  daily   . pravastatin  40 MG tab Take 40 mg by mouth at bedtime.    . prochlorperazine  5 MG tab Take 5 mg by mouth every 6  hours as needed. Dizzy  . warfarin  2 MG tab Take 2-3 mg by mouth daily. 3 MG M,W,F and 2 mg TU,TH,SA,SU   Allergies  Allergen Reactions  . Advair Diskus [Fluticasone-Salmeterol] Other (See Comments)    SKIN CHANGES  . Amiodarone Other (See Comments)    PULMONARY TOXICITY  . Codeine     unknown  . Diovan [Valsartan] Other (See Comments)    HYPOTENSION  . Doxycycline Diarrhea and Other (See Comments)    VISUAL DISTURBANCE  . Flexeril [Cyclobenzaprine] Other (See Comments)    FATIGUE  . Keflex [Cephalexin] Diarrhea  . Verapamil Other (See Comments)    EDEMA   Past Medical History  Diagnosis Date  . Atrial fibrillation   . COPD (chronic obstructive pulmonary disease)   . Bronchitis, chronic   . History of Bell's palsy   . Hypertension   . Coronary artery disease   . Migraine headache   . Atrial flutter   . Tobacco abuse   . Osteopenia   . Cervical dysplasia   . Thyroid disease     Hypo  . Elevated cholesterol   . Unspecified  vitamin D deficiency   . Family history of anesthesia complication     Hard for daughter to wake up after Anesthesia  . Shortness of breath     with exertion  . Hypothyroidism   . Cystocele     either on bladder or kidneys patient is unsure but stated physician will watch it  . Seasonal allergies   . Dizziness     If patient gets out of bed too fast  . H/O hiatal hernia   . Rosacea conjunctivitis     Right eye is worse than left eye  . Arthritis   . Gout   . Aortic stenosis     mild AS witm mild to mod AR by 11/2012 echo   Past Surgical History  Procedure Laterality Date  . Broken leg Left 1970s  . Laser vein procedure    . Refractive surgery    . Colonoscopy    . Cholecystectomy  1972  . Tonsillectomy    . Eye surgery Bilateral   . Cardiac catheterization     . Abdominal aortic endovascular stent graft N/A 04/11/2013    Procedure: ABDOMINAL AORTIC ENDOVASCULAR STENT GRAFT WITH RIGHT FEMORAL PATCH ANGIOPLASTY;  Surgeon: Mal Misty, MD;  Location: [facility] OR;  Service: Vascular;  Laterality: N/A;  . Breast surgery      LEFT BREAST BIOPSY   Family History  Problem Relation Age of Onset  . Cirrhosis Mother   . Cancer Mother 103    PANCREAS  . Heart defect Sister   . Breast cancer Sister     age 50  . Heart disease Sister   . Stroke Sister   . Alcohol abuse Father   . Depression Father   . Hypertension Brother   . Hyperlipidemia Son   . Heart disease Daughter    History  Substance Use Topics  . Smoking status: Former Smoker    Types: Cigarettes    Quit date: 08/10/2002  . Smokeless tobacco: Never Used     Comment: History of tobacco abuse  . Alcohol Use: 0.0 oz/week     Comment: rare    ROS Constitutional: Denies fever, chills, weight loss/gain, headaches, insomnia, fatigue, night sweats, and change in appetite. Eyes: Denies redness, blurred vision, diplopia, discharge, itchy, watery eyes.  ENT: Denies discharge, congestion, post nasal drip, epistaxis, sore throat, earache, hearing loss, dental pain, Tinnitus, Vertigo, Sinus pain, snoring.  Cardio: Denies chest pain, palpitations, irregular heartbeat, syncope, dyspnea, diaphoresis, orthopnea, PND, claudication, edema Respiratory: denies cough, sputum Production, dyspnea, pleurisy, hoarseness, laryngitis, wheezing.  Does report DOE. Gastrointestinal: Denies dysphagia, heartburn, reflux, water brash, pain, cramps, nausea, vomiting, bloating, diarrhea, constipation, hematemesis, melena, hematochezia, jaundice, hemorrhoids Genitourinary: Denies dysuria, frequency, urgency, nocturia, hesitancy, discharge, hematuria, flank pain Breast: Breast lumps, nipple discharge, bleeding.  Musculoskeletal: Denies arthralgia, myalgia, stiffness, Jt. Swelling, pain, limp, and strain/sprain. Denies  falls. Skin: Denies puritis, rash, hives, warts, acne, eczema, changing in skin lesion Neuro: No weakness, tremor, incoordination, spasms, paresthesia, pain Psychiatric: Denies confusion, memory loss, sensory loss. Denies Depression. Endocrine: Denies change in weight, skin, hair change, nocturia, and paresthesia, diabetic polys, visual blurring, hyper / hypo glycemic episodes.  Heme/Lymph: No excessive bleeding, bruising, enlarged lymph nodes.  Physical Exam  BP 118/72  Pulse 68  Temp(Src) 98.1 F (36.7 C) (Temporal)  Resp 16  Ht 5\' 4"  (AB-123456789 m)  Wt 185 lb 6.4 oz (84.097 kg)  BMI 31.81 kg/m2  General Appearance: Well nourished and in no apparent distress. Eyes: PERRLA,  EOMs, conjunctiva no swelling or erythema, normal fundi and vessels. Sinuses: No frontal/maxillary tenderness ENT/Mouth: EACs patent / TMs  nl. Nares clear without erythema, swelling, mucoid exudates. Oral hygiene is good. No erythema, swelling, or exudate. Tongue normal, non-obstructing. Tonsils not swollen or erythematous. Hearing normal.  Neck: Supple, thyroid normal. No bruits, nodes or JVD. Respiratory: Respiratory effort normal.  BS equal and clear bilateral without rales, rhonci, wheezing or stridor. Cardio: Heart sounds are normal with regular rate and rhythm and no murmurs, rubs or gallops. Peripheral pulses are normal and equal bilaterally without edema. No aortic or femoral bruits. Chest: symmetric with normal excursions and percussion. Breasts: Symmetric, without lumps, nipple discharge, retractions, or fibrocystic changes.  Abdomen: Flat, soft, with bowl sounds. Nontender, no guarding, rebound, hernias, masses, or organomegaly.  Lymphatics: Non tender without lymphadenopathy.  Genitourinary:  Musculoskeletal: Full ROM all peripheral extremities, joint stability, 5/5 strength, and normal gait. Skin: Warm and dry without rashes, lesions, cyanosis, clubbing or  ecchymosis.  Neuro: Cranial nerves intact,  reflexes equal bilaterally. Normal muscle tone, no cerebellar symptoms. Sensation intact.  Pysch: Awake and oriented X 3, normal affect, Insight and Judgment appropriate.   Assessment and Plan  1. Annual Screening Examination 2. Hypertension  3. Hyperlipidemia 4. Pre Diabetes 5. Vitamin D Deficiency 6. ASHD w/ChAfib 7. Stage 3 CKD  8. COPD  Continue prudent diet as discussed, weight control, BP monitoring, regular exercise, and medications. Discussed med's effects and SE's. Screening labs and tests as requested with regular follow-up as recommended.

## 2013-11-05 ENCOUNTER — Ambulatory Visit (INDEPENDENT_AMBULATORY_CARE_PROVIDER_SITE_OTHER): Payer: Medicare Other | Admitting: Internal Medicine

## 2013-11-05 ENCOUNTER — Encounter: Payer: Self-pay | Admitting: Internal Medicine

## 2013-11-05 VITALS — BP 118/72 | HR 68 | Temp 98.1°F | Resp 16 | Ht 64.0 in | Wt 185.4 lb

## 2013-11-05 DIAGNOSIS — Z1331 Encounter for screening for depression: Secondary | ICD-10-CM

## 2013-11-05 DIAGNOSIS — Z1212 Encounter for screening for malignant neoplasm of rectum: Secondary | ICD-10-CM

## 2013-11-05 DIAGNOSIS — R7309 Other abnormal glucose: Secondary | ICD-10-CM

## 2013-11-05 DIAGNOSIS — Z7901 Long term (current) use of anticoagulants: Secondary | ICD-10-CM

## 2013-11-05 DIAGNOSIS — E559 Vitamin D deficiency, unspecified: Secondary | ICD-10-CM

## 2013-11-05 DIAGNOSIS — E782 Mixed hyperlipidemia: Secondary | ICD-10-CM

## 2013-11-05 DIAGNOSIS — Z79899 Other long term (current) drug therapy: Secondary | ICD-10-CM

## 2013-11-05 DIAGNOSIS — Z0000 Encounter for general adult medical examination without abnormal findings: Secondary | ICD-10-CM

## 2013-11-05 DIAGNOSIS — Z789 Other specified health status: Secondary | ICD-10-CM

## 2013-11-05 DIAGNOSIS — I10 Essential (primary) hypertension: Secondary | ICD-10-CM

## 2013-11-05 LAB — CBC WITH DIFFERENTIAL/PLATELET
Basophils Absolute: 0 10*3/uL (ref 0.0–0.1)
Basophils Relative: 0 % (ref 0–1)
EOS ABS: 0.5 10*3/uL (ref 0.0–0.7)
Eosinophils Relative: 5 % (ref 0–5)
HCT: 42.6 % (ref 36.0–46.0)
Hemoglobin: 14.4 g/dL (ref 12.0–15.0)
LYMPHS ABS: 2 10*3/uL (ref 0.7–4.0)
Lymphocytes Relative: 21 % (ref 12–46)
MCH: 30.3 pg (ref 26.0–34.0)
MCHC: 33.8 g/dL (ref 30.0–36.0)
MCV: 89.5 fL (ref 78.0–100.0)
Monocytes Absolute: 1.1 10*3/uL — ABNORMAL HIGH (ref 0.1–1.0)
Monocytes Relative: 11 % (ref 3–12)
NEUTROS ABS: 6 10*3/uL (ref 1.7–7.7)
NEUTROS PCT: 63 % (ref 43–77)
Platelets: 233 10*3/uL (ref 150–400)
RBC: 4.76 MIL/uL (ref 3.87–5.11)
RDW: 15.6 % — ABNORMAL HIGH (ref 11.5–15.5)
WBC: 9.6 10*3/uL (ref 4.0–10.5)

## 2013-11-05 LAB — PROTIME-INR
INR: 2.36 — ABNORMAL HIGH (ref <1.50)
PROTHROMBIN TIME: 25.8 s — AB (ref 11.6–15.2)

## 2013-11-05 MED ORDER — TIOTROPIUM BROMIDE MONOHYDRATE 2.5 MCG/ACT IN AERS: INHALATION_SPRAY | RESPIRATORY_TRACT | Status: DC

## 2013-11-05 MED ORDER — ROFLUMILAST 500 MCG PO TABS: 500.0000 ug | ORAL_TABLET | Freq: Every day | ORAL | Status: DC

## 2013-11-06 LAB — HEPATIC FUNCTION PANEL
ALT: 11 U/L (ref 0–35)
AST: 14 U/L (ref 0–37)
Albumin: 4.2 g/dL (ref 3.5–5.2)
Alkaline Phosphatase: 47 U/L (ref 39–117)
BILIRUBIN DIRECT: 0.2 mg/dL (ref 0.0–0.3)
BILIRUBIN INDIRECT: 0.5 mg/dL (ref 0.2–1.2)
Total Bilirubin: 0.7 mg/dL (ref 0.2–1.2)
Total Protein: 6.5 g/dL (ref 6.0–8.3)

## 2013-11-06 LAB — MICROALBUMIN / CREATININE URINE RATIO
Creatinine, Urine: 28.6 mg/dL
Microalb Creat Ratio: 57.7 mg/g — ABNORMAL HIGH (ref 0.0–30.0)
Microalb, Ur: 1.65 mg/dL (ref 0.00–1.89)

## 2013-11-06 LAB — TSH: TSH: 3.849 u[IU]/mL (ref 0.350–4.500)

## 2013-11-06 LAB — URINALYSIS, MICROSCOPIC ONLY
CASTS: NONE SEEN
Crystals: NONE SEEN

## 2013-11-06 LAB — HEMOGLOBIN A1C
Hgb A1c MFr Bld: 5.9 % — ABNORMAL HIGH (ref <5.7)
MEAN PLASMA GLUCOSE: 123 mg/dL — AB (ref <117)

## 2013-11-06 LAB — LIPID PANEL
CHOLESTEROL: 159 mg/dL (ref 0–200)
HDL: 37 mg/dL — ABNORMAL LOW (ref >39)
LDL Cholesterol: 88 mg/dL (ref 0–99)
Total CHOL/HDL Ratio: 4.3 Ratio
Triglycerides: 171 mg/dL — ABNORMAL HIGH (ref <150)
VLDL: 34 mg/dL (ref 0–40)

## 2013-11-06 LAB — BASIC METABOLIC PANEL WITH GFR
BUN: 18 mg/dL (ref 6–23)
CO2: 28 mEq/L (ref 19–32)
Calcium: 9.8 mg/dL (ref 8.4–10.5)
Chloride: 101 mEq/L (ref 96–112)
Creat: 1.06 mg/dL (ref 0.50–1.10)
GFR, Est African American: 61 mL/min
GFR, Est Non African American: 53 mL/min — ABNORMAL LOW
GLUCOSE: 105 mg/dL — AB (ref 70–99)
POTASSIUM: 4.6 meq/L (ref 3.5–5.3)
Sodium: 140 mEq/L (ref 135–145)

## 2013-11-06 LAB — MAGNESIUM: Magnesium: 2 mg/dL (ref 1.5–2.5)

## 2013-11-06 LAB — INSULIN, FASTING: Insulin fasting, serum: 14 u[IU]/mL (ref 2.0–19.6)

## 2013-11-06 LAB — DIGOXIN LEVEL: Digoxin Level: 1.3 ng/mL (ref 0.8–2.0)

## 2013-11-06 LAB — VITAMIN D 25 HYDROXY (VIT D DEFICIENCY, FRACTURES): Vit D, 25-Hydroxy: 44 ng/mL (ref 30–89)

## 2013-11-08 ENCOUNTER — Telehealth: Payer: Self-pay | Admitting: *Deleted

## 2013-11-08 NOTE — Telephone Encounter (Signed)
Patient called and states her eye doctor gave her an RX azithromycin for an eye infection that she will take daily for at least 1 month.  Patient also taking Coumadin and Digoxin, which can be affected by the antibiotic. Patient declined to change her Coumadin to xarelto, which does not require protimes.  Per Dr Melford Aase, check protime in 5 days and NV scheduled(V58.61.

## 2013-11-13 ENCOUNTER — Ambulatory Visit (INDEPENDENT_AMBULATORY_CARE_PROVIDER_SITE_OTHER): Payer: Medicare Other | Admitting: *Deleted

## 2013-11-13 DIAGNOSIS — Z7901 Long term (current) use of anticoagulants: Secondary | ICD-10-CM

## 2013-11-13 DIAGNOSIS — Z79899 Other long term (current) drug therapy: Secondary | ICD-10-CM

## 2013-11-13 LAB — DIGOXIN LEVEL: Digoxin Level: 1.6 ng/mL (ref 0.8–2.0)

## 2013-11-13 LAB — PROTIME-INR
INR: 1.86 — ABNORMAL HIGH (ref <1.50)
Prothrombin Time: 21.4 seconds — ABNORMAL HIGH (ref 11.6–15.2)

## 2013-11-13 NOTE — Progress Notes (Signed)
Patient ID: Katie [name], female   DOB: 09-Aug-1940, 73 y.o.   MRN: [id number] Patient here today for recheck PT/INR and Digoxin.  Patient called and states her eye doctor gave her an RX azithromycin for an eye infection that she will take daily for at least 1 month. Patient also taking Coumadin and Digoxin, which can be affected by the antibiotic. Patient declined to change her Coumadin to xarelto, which does not require protimes. Per Dr Melford Aase, check protime in 5 days.

## 2013-11-21 ENCOUNTER — Ambulatory Visit (INDEPENDENT_AMBULATORY_CARE_PROVIDER_SITE_OTHER): Payer: Medicare Other | Admitting: *Deleted

## 2013-11-21 DIAGNOSIS — Z7901 Long term (current) use of anticoagulants: Secondary | ICD-10-CM

## 2013-11-21 LAB — PROTIME-INR
INR: 1.78 — ABNORMAL HIGH (ref <1.50)
Prothrombin Time: 20.7 seconds — ABNORMAL HIGH (ref 11.6–15.2)

## 2013-11-21 NOTE — Progress Notes (Signed)
Patient ID: SEE OMALLEY, female   DOB: Jun 03, 1940, 73 y.o.   MRN: [id number] Patient presents for 1 week lab, recheck PT/INR.  Patient currently taking 3 mg times 3 days (M,W,F) and 2 mg times 4 days per week.

## 2013-11-27 ENCOUNTER — Ambulatory Visit: Payer: Medicare Other | Admitting: Vascular Surgery

## 2013-11-27 ENCOUNTER — Other Ambulatory Visit: Payer: Medicare Other

## 2013-12-03 ENCOUNTER — Other Ambulatory Visit: Payer: Self-pay | Admitting: Internal Medicine

## 2013-12-05 ENCOUNTER — Ambulatory Visit (INDEPENDENT_AMBULATORY_CARE_PROVIDER_SITE_OTHER): Payer: Medicare Other | Admitting: *Deleted

## 2013-12-05 DIAGNOSIS — Z7901 Long term (current) use of anticoagulants: Secondary | ICD-10-CM

## 2013-12-05 LAB — PROTIME-INR
INR: 2.78 — ABNORMAL HIGH (ref <1.50)
Prothrombin Time: 29.3 seconds — ABNORMAL HIGH (ref 11.6–15.2)

## 2013-12-05 NOTE — Progress Notes (Signed)
Patient ID: Katie [name], female   DOB: Apr 29, 1940, 73 y.o.   MRN: [id number] Patient presents for PT/INR recheck today.  Patient states she is currently taking 3 mg Coumadin times 5 days and 2 mg times 2 days.

## 2013-12-06 ENCOUNTER — Ambulatory Visit: Payer: Self-pay

## 2013-12-20 ENCOUNTER — Encounter: Payer: Self-pay | Admitting: Physician Assistant

## 2013-12-20 ENCOUNTER — Ambulatory Visit (INDEPENDENT_AMBULATORY_CARE_PROVIDER_SITE_OTHER): Payer: Medicare Other | Admitting: Physician Assistant

## 2013-12-20 VITALS — BP 117/68 | HR 60 | Temp 97.7°F | Resp 16 | Ht 63.0 in | Wt 191.0 lb

## 2013-12-20 DIAGNOSIS — I482 Chronic atrial fibrillation, unspecified: Secondary | ICD-10-CM

## 2013-12-20 DIAGNOSIS — B3731 Acute candidiasis of vulva and vagina: Secondary | ICD-10-CM

## 2013-12-20 DIAGNOSIS — Z7901 Long term (current) use of anticoagulants: Secondary | ICD-10-CM

## 2013-12-20 DIAGNOSIS — B373 Candidiasis of vulva and vagina: Secondary | ICD-10-CM

## 2013-12-20 DIAGNOSIS — Z23 Encounter for immunization: Secondary | ICD-10-CM

## 2013-12-20 LAB — PROTIME-INR
INR: 2.46 — ABNORMAL HIGH (ref <1.50)
Prothrombin Time: 26.7 seconds — ABNORMAL HIGH (ref 11.6–15.2)

## 2013-12-20 MED ORDER — FLUCONAZOLE 150 MG PO TABS: 150.0000 mg | ORAL_TABLET | Freq: Every day | ORAL | Status: DC

## 2013-12-20 MED ORDER — TERCONAZOLE 0.4 % VA CREA: 1.0000 | TOPICAL_CREAM | Freq: Every day | VAGINAL | Status: DC

## 2013-12-20 NOTE — Progress Notes (Signed)
Coumadin follow up Patient is on Coumadin for chronic atrial fibrillation.  Patient's last INR is  Lab Results  Component Value Date   INR 2.78* 12/05/2013   INR 1.78* 11/21/2013   INR 1.86* 11/13/2013    Patient denies SOB, CP, dizziness, nose bleeds, easy bleeding, and blood in stool/urine. Her coumadin dose was not changed last visit, she is on 3 mg 5 days a week and 2mg  2 days week. She is on Zpak from her eye doctor, this is the 2nd month of ABX.  She also things she has a yeast infection from the ABX, she has itching/discharge.    Current Outpatient Prescriptions on File Prior to Visit  Medication Sig Dispense Refill  . albuterol (PROVENTIL HFA;VENTOLIN HFA) 108 (90 BASE) MCG/ACT inhaler 2 puffs 5 minutes apart every 4 to 6 hours as needed to rescue asthma  1 Inhaler  11  . allopurinol (ZYLOPRIM) 300 MG tablet TAKE 1 TABLET BY MOUTH DAILY  90 tablet  1  . aspirin 81 MG tablet Take 81 mg by mouth daily.        . benazepril (LOTENSIN) 20 MG tablet TAKE 1 TABLET BY MOUTH DAILY AS NEEDED FOR BLOOD PRESSURE AND HEART  90 tablet  2  . bisoprolol-hydrochlorothiazide (ZIAC) 5-6.25 MG per tablet TAKE 1 TABLET BY MOUTH DAILY AS NEEDED FOR BLOOD PRESSURE AND HEART  90 tablet  3  . digoxin (LANOXIN) 0.25 MG tablet TAKE 1 TABLET BY MOUTH DAILY  90 tablet  3  . furosemide (LASIX) 80 MG tablet TAKE 1 TABLET BY MOUTH TWICE A DAY AS NEEDED FOR FLUID  90 tablet  2  . gabapentin (NEURONTIN) 300 MG capsule TAKE ONE CAPSULE BY MOUTH 3 TIMES A DAY  270 capsule  3  . ipratropium (ATROVENT) 0.02 % nebulizer solution Take 2.5 mLs (0.5 mg total) by nebulization every 4 (four) hours as needed for wheezing or shortness of breath.  75 mL  2  . levothyroxine (SYNTHROID, LEVOTHROID) 50 MCG tablet TAKE 1 TABLET EVERY DAY  90 tablet  3  . montelukast (SINGULAIR) 10 MG tablet Take 1 tablet (10 mg total) by mouth daily as needed. For allergy  90 tablet  3  . pravastatin (PRAVACHOL) 40 MG tablet Take 40 mg by mouth at  bedtime.        . prochlorperazine (COMPAZINE) 5 MG tablet Take 5 mg by mouth every 6 (six) hours as needed. Dizzy      . roflumilast (DALIRESP) 500 MCG TABS tablet Take 1 tablet (500 mcg total) by mouth daily.  30 tablet  99  . Tiotropium Bromide Monohydrate (SPIRIVA RESPIMAT) 2.5 MCG/ACT AERS Use 1 to 2 puff once daily  4 g  99  . warfarin (COUMADIN) 2 MG tablet Take 2-3 mg by mouth daily. 3 MG M,W,F and 2 mg TU,TH,SA,SU       No current facility-administered medications on file prior to visit.   Past Medical History  Diagnosis Date  . Atrial fibrillation   . COPD (chronic obstructive pulmonary disease)   . Bronchitis, chronic   . History of Bell's palsy   . Hypertension   . Coronary artery disease   . Migraine headache   . Atrial flutter   . Tobacco abuse   . Osteopenia   . Cervical dysplasia   . Thyroid disease     Hypo  . Elevated cholesterol   . Unspecified vitamin D deficiency   . Family history of anesthesia complication  Hard for daughter to wake up after Anesthesia  . Shortness of breath     with exertion  . Hypothyroidism   . Cystocele     either on bladder or kidneys patient is unsure but stated physician will watch it  . Seasonal allergies   . Dizziness     If patient gets out of bed too fast  . H/O hiatal hernia   . Rosacea conjunctivitis     Right eye is worse than left eye  . Arthritis   . Gout   . Aortic stenosis     mild AS witm mild to mod AR by 11/2012 echo   Allergies  Allergen Reactions  . Advair Diskus [Fluticasone-Salmeterol] Other (See Comments)    SKIN CHANGES  . Amiodarone Other (See Comments)    PULMONARY TOXICITY  . Codeine     unknown  . Diovan [Valsartan] Other (See Comments)    HYPOTENSION  . Doxycycline Diarrhea and Other (See Comments)    VISUAL DISTURBANCE  . Flexeril [Cyclobenzaprine] Other (See Comments)    FATIGUE  . Keflex [Cephalexin] Diarrhea  . Verapamil Other (See Comments)    EDEMA    ROS Constitutional:  Denies fever, chills, headaches, fatigue. Cardio: Denies chest pain, palpitations, irregular heartbeat, syncope, dyspnea, diaphoresis, orthopnea, PND, claudication, edema Respiratory: denies cough, dyspnea, DOE, pleurisy, hoarseness, laryngitis, wheezing.  Gastrointestinal: Denies dysphagia, heartburn, reflux, pain, cramps, nausea, diarrhea, constipation, hematemesis, melena, hematochezia Genitourinary: Denies dysuria, frequency, hematuria, flank pain Musculoskeletal: Denies arthralgia, myalgia, stiffness, Jt. Swelling, pain, limp, strain/sprain. Skin: Denies rash, ecchymosis, petechial. Neuro: Denies Weakness, tremor, incoordination, spasms, paresthesia, pain Heme/Lymph: Denies Excessive bleeding, bruising, enlarged lymph nodes  Physical: Blood pressure 117/68, pulse 60, temperature 97.7 F (36.5 C), resp. rate 16, height 5\' 3"  (1.6 m), weight 191 lb (86.637 kg). Filed Weights   12/20/13 1448  Weight: 191 lb (86.637 kg)   General Appearance: Well nourished, in no apparent distress. ENT/Mouth: Nares clear with no erythema, swelling, mucus on turbinates. No ulcers, cracking, on lips. No erythema, swelling, or exudate on post pharynx.  Neck: Supple, thyroid normal.  Respiratory: CTAB   Cardio:  Irregular, irregular rhythm, no murmurs, rubs or gallops. No edema  Abdomen: Soft, with bowl sounds. Non tender, no guarding, rebound, hernias, masses, or organomegaly.  Skin: Warm, dry without rashes, lesions, ecchymosis.  Neuro: Unremarkable  Assessment and plan: Chronic anticoagulation- check INR and will adjust medication according to labs, still on zpak for eyes.  Discussed if patient falls to immediately contact office or go to ER. Discussed foods that can increase or decrease Coumadin levels. Patient understands to call the office before starting a new medication. Follow up in one month or sooner depending on labs.    3. Yeast vaginitis Diflucan and cream sent in

## 2013-12-25 ENCOUNTER — Ambulatory Visit (INDEPENDENT_AMBULATORY_CARE_PROVIDER_SITE_OTHER): Payer: Medicare Other | Admitting: Internal Medicine

## 2013-12-25 ENCOUNTER — Encounter: Payer: Self-pay | Admitting: Internal Medicine

## 2013-12-25 VITALS — BP 138/76 | HR 58 | Temp 98.0°F | Resp 18 | Ht 63.25 in | Wt 190.0 lb

## 2013-12-25 DIAGNOSIS — I482 Chronic atrial fibrillation, unspecified: Secondary | ICD-10-CM

## 2013-12-25 DIAGNOSIS — Z7901 Long term (current) use of anticoagulants: Secondary | ICD-10-CM

## 2013-12-25 DIAGNOSIS — J440 Chronic obstructive pulmonary disease with acute lower respiratory infection: Secondary | ICD-10-CM

## 2013-12-25 MED ORDER — PREDNISONE 10 MG PO TABS
ORAL_TABLET | ORAL | Status: DC
Start: 2013-12-25 — End: 2014-01-23

## 2013-12-25 MED ORDER — IPRATROPIUM-ALBUTEROL 0.5-2.5 (3) MG/3ML IN SOLN: RESPIRATORY_TRACT | Status: DC

## 2013-12-25 MED ORDER — AZITHROMYCIN 250 MG PO TABS: ORAL_TABLET | ORAL | Status: DC

## 2013-12-25 NOTE — Progress Notes (Signed)
   Subjective:    Patient ID: Katie [name], female    DOB: 09-24-1940, 73 y.o.   MRN: [id number]  HPI Patient presents for f/u PT/INR and also has c/o upper & lower Respiratory infection. Sick for 2 days with purulent sputum.  Meds/Allergies/PMH reviewed.  Review of Systems neg except as above   Objective:   Physical Exam  BP 138/76  Pulse 58  Temp(Src) 98 F (36.7 C) (Temporal)  Resp 18  Ht 5' 3.25" (1.607 m)  Wt 190 lb (86.183 kg)  BMI 33.37 kg/m2  SpO2 98%  No Respiratory Distress HEENT - Eac's patent. TM's Nl. EOM's full. PERRLA. (+) Mtender frontal & maxillary areas. NasoOroPharynx clear. Neck - supple. Nl Thyroid. Carotids 2+ & No bruits, nodes, JVD Chest - Clear equal BS w/ scattered  Rales, rhonchi and no  wheezes. Cor - Nl HS. RRR w/o sig MGR. PP 1(+). No edema. Abd - No palpable organomegaly, masses or tenderness. BS nl. MS- FROM w/o deformities. Muscle power, tone and bulk Nl. Gait Nl. Neuro - No obvious Cr N abnormalities. Sensory, motor and Cerebellar functions appear Nl w/o focal abnormalities. Psyche - Mental status normal & appropriate.  No delusions, ideations or obvious mood abnormalities.  Assessment & Plan:   1. Bronchitis, chronic obstructive w acute bronchitis  - predniSONE (DELTASONE) 10 MG tablet; 1 tab 3 x day for 3 days, then 1 tab 2 x day for 3 days, then 1 tab 1 x day for 5 days  Dispense: 20 tablet; Refill: 0 - azithromycin (ZITHROMAX) 250 MG tablet; Take 2 tablets (500 mg) on  Day 1,  followed by 1 tablet (250 mg) once daily on Days 2 through 5.  Dispense: 6 each; Refill: 1  2. Chronic atrial fibrillation   3. Long term current use of anticoagulant therapy  - Protime-INR

## 2013-12-26 ENCOUNTER — Ambulatory Visit: Payer: Self-pay | Admitting: Physician Assistant

## 2013-12-26 LAB — PROTIME-INR
INR: 2.46 — AB (ref <1.50)
Prothrombin Time: 26.7 seconds — ABNORMAL HIGH (ref 11.6–15.2)

## 2013-12-28 ENCOUNTER — Telehealth: Payer: Self-pay | Admitting: *Deleted

## 2013-12-28 NOTE — Telephone Encounter (Signed)
Patient aware of protime result.. Per Dr Melford Aase, continue Coumadin at the same dose and 2 week NV appointment made to recheck protime.

## 2014-01-01 ENCOUNTER — Ambulatory Visit: Payer: Self-pay

## 2014-01-04 ENCOUNTER — Other Ambulatory Visit: Payer: Self-pay | Admitting: Internal Medicine

## 2014-01-08 ENCOUNTER — Ambulatory Visit: Payer: Self-pay | Admitting: *Deleted

## 2014-01-08 DIAGNOSIS — Z7901 Long term (current) use of anticoagulants: Secondary | ICD-10-CM

## 2014-01-08 LAB — PROTIME-INR
INR: 3 — AB (ref <1.50)
PROTHROMBIN TIME: 31.1 s — AB (ref 11.6–15.2)

## 2014-01-08 NOTE — Progress Notes (Signed)
Patient ID: Katie [name], female   DOB: [birth year], 73 y.o.   MRN: [id number] Patient here for recheck PT/INR.  Currently taking Coumadin 3 mg times 5 days and 2 mg times 2 days per week.

## 2014-01-09 ENCOUNTER — Other Ambulatory Visit: Payer: Self-pay | Admitting: Internal Medicine

## 2014-01-12 ENCOUNTER — Encounter (HOSPITAL_COMMUNITY): Payer: Self-pay | Admitting: Emergency Medicine

## 2014-01-12 ENCOUNTER — Emergency Department (HOSPITAL_COMMUNITY)
Admission: EM | Admit: 2014-01-12 | Discharge: 2014-01-12 | Disposition: A | Discharge status: Home / Self Care | Payer: No Typology Code available for payment source | Attending: Emergency Medicine | Admitting: Emergency Medicine

## 2014-01-12 ENCOUNTER — Emergency Department (HOSPITAL_COMMUNITY): Payer: No Typology Code available for payment source

## 2014-01-12 DIAGNOSIS — Z792 Long term (current) use of antibiotics: Secondary | ICD-10-CM | POA: Diagnosis not present

## 2014-01-12 DIAGNOSIS — Z7982 Long term (current) use of aspirin: Secondary | ICD-10-CM | POA: Insufficient documentation

## 2014-01-12 DIAGNOSIS — I2510 Atherosclerotic heart disease of native coronary artery without angina pectoris: Secondary | ICD-10-CM | POA: Diagnosis not present

## 2014-01-12 DIAGNOSIS — Z87891 Personal history of nicotine dependence: Secondary | ICD-10-CM | POA: Diagnosis not present

## 2014-01-12 DIAGNOSIS — Y9389 Activity, other specified: Secondary | ICD-10-CM | POA: Insufficient documentation

## 2014-01-12 DIAGNOSIS — Z79899 Other long term (current) drug therapy: Secondary | ICD-10-CM | POA: Diagnosis not present

## 2014-01-12 DIAGNOSIS — M109 Gout, unspecified: Secondary | ICD-10-CM | POA: Insufficient documentation

## 2014-01-12 DIAGNOSIS — Z8719 Personal history of other diseases of the digestive system: Secondary | ICD-10-CM | POA: Diagnosis not present

## 2014-01-12 DIAGNOSIS — I4891 Unspecified atrial fibrillation: Secondary | ICD-10-CM | POA: Insufficient documentation

## 2014-01-12 DIAGNOSIS — E039 Hypothyroidism, unspecified: Secondary | ICD-10-CM | POA: Diagnosis not present

## 2014-01-12 DIAGNOSIS — Z7901 Long term (current) use of anticoagulants: Secondary | ICD-10-CM | POA: Insufficient documentation

## 2014-01-12 DIAGNOSIS — J449 Chronic obstructive pulmonary disease, unspecified: Secondary | ICD-10-CM | POA: Diagnosis not present

## 2014-01-12 DIAGNOSIS — M1990 Unspecified osteoarthritis, unspecified site: Secondary | ICD-10-CM | POA: Diagnosis not present

## 2014-01-12 DIAGNOSIS — Y92411 Interstate highway as the place of occurrence of the external cause: Secondary | ICD-10-CM | POA: Diagnosis not present

## 2014-01-12 DIAGNOSIS — Z8669 Personal history of other diseases of the nervous system and sense organs: Secondary | ICD-10-CM | POA: Diagnosis not present

## 2014-01-12 DIAGNOSIS — G43909 Migraine, unspecified, not intractable, without status migrainosus: Secondary | ICD-10-CM | POA: Insufficient documentation

## 2014-01-12 DIAGNOSIS — Z9889 Other specified postprocedural states: Secondary | ICD-10-CM | POA: Insufficient documentation

## 2014-01-12 DIAGNOSIS — S0990XA Unspecified injury of head, initial encounter: Secondary | ICD-10-CM | POA: Insufficient documentation

## 2014-01-12 DIAGNOSIS — I10 Essential (primary) hypertension: Secondary | ICD-10-CM | POA: Insufficient documentation

## 2014-01-12 DIAGNOSIS — Z952 Presence of prosthetic heart valve: Secondary | ICD-10-CM | POA: Diagnosis not present

## 2014-01-12 LAB — CBC
HEMATOCRIT: 46.9 % — AB (ref 36.0–46.0)
Hemoglobin: 15.4 g/dL — ABNORMAL HIGH (ref 12.0–15.0)
MCH: 31.1 pg (ref 26.0–34.0)
MCHC: 32.8 g/dL (ref 30.0–36.0)
MCV: 94.7 fL (ref 78.0–100.0)
PLATELETS: 201 10*3/uL (ref 150–400)
RBC: 4.95 MIL/uL (ref 3.87–5.11)
RDW: 14.8 % (ref 11.5–15.5)
WBC: 10 10*3/uL (ref 4.0–10.5)

## 2014-01-12 LAB — PROTIME-INR
INR: 2.73 — AB (ref 0.00–1.49)
Prothrombin Time: 29.2 seconds — ABNORMAL HIGH (ref 11.6–15.2)

## 2014-01-12 MED ORDER — OXYCODONE-ACETAMINOPHEN 5-325 MG PO TABS: 1.0000 | ORAL_TABLET | Freq: Four times a day (QID) | ORAL | Status: DC | PRN

## 2014-01-12 MED ORDER — OXYCODONE-ACETAMINOPHEN 5-325 MG PO TABS
1.0000 | ORAL_TABLET | Freq: Once | ORAL | Status: AC
  Administered 2014-01-12: 1 via ORAL
  Filled 2014-01-12: qty 1

## 2014-01-12 MED ORDER — ONDANSETRON 4 MG PO TBDP
4.0000 mg | ORAL_TABLET | Freq: Once | ORAL | Status: AC
  Administered 2014-01-12: 4 mg via ORAL
  Filled 2014-01-12: qty 1

## 2014-01-12 NOTE — Discharge Instructions (Signed)
Return to the ED with any concerns including vomiting, seizure activity, abdominal pain, vomiting, difficulty breathing, chest pain, decreased level of alertness/lethargy, or any other alarming symptoms

## 2014-01-12 NOTE — ED Notes (Signed)
Called CT to get update per family's request on time.
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
## 2014-01-14 ENCOUNTER — Encounter (HOSPITAL_COMMUNITY): Payer: Self-pay | Admitting: Emergency Medicine

## 2014-01-14 ENCOUNTER — Telehealth: Payer: Self-pay | Admitting: *Deleted

## 2014-01-14 MED ORDER — CYCLOBENZAPRINE HCL 10 MG PO TABS: ORAL_TABLET | ORAL | Status: DC

## 2014-01-14 NOTE — Telephone Encounter (Signed)
Patient called and left detailed message with the front staff stating concerns.  I received a written message stating patient was in [place] 01/12/14 and went to hospital.  Was given Oxycodone but patient concerned this rx is too strong, requesting a different pain Rx to help with neck pain.  Per Dr. Melford Aase, a Rx for Flexeril 10 mg sent into pharmacy for patient and advised her to call if no relief with symptoms after trying the Flexeril rx.  Patient aware and will call back if no relief with symptoms or if she feels she needs further evaluation.

## 2014-01-23 ENCOUNTER — Ambulatory Visit (INDEPENDENT_AMBULATORY_CARE_PROVIDER_SITE_OTHER): Payer: Medicare Other | Admitting: Internal Medicine

## 2014-01-23 ENCOUNTER — Encounter: Payer: Self-pay | Admitting: Internal Medicine

## 2014-01-23 VITALS — BP 106/66 | HR 68 | Temp 97.4°F | Resp 16 | Ht 63.5 in | Wt 193.4 lb

## 2014-01-23 DIAGNOSIS — I482 Chronic atrial fibrillation, unspecified: Secondary | ICD-10-CM

## 2014-01-23 DIAGNOSIS — Z23 Encounter for immunization: Secondary | ICD-10-CM

## 2014-01-23 DIAGNOSIS — Z7901 Long term (current) use of anticoagulants: Secondary | ICD-10-CM

## 2014-01-23 DIAGNOSIS — I10 Essential (primary) hypertension: Secondary | ICD-10-CM

## 2014-01-23 LAB — PROTIME-INR
INR: 1.96 — ABNORMAL HIGH (ref <1.50)
PROTHROMBIN TIME: 22.3 s — AB (ref 11.6–15.2)

## 2014-01-23 NOTE — Patient Instructions (Signed)
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
## 2014-02-03 ENCOUNTER — Encounter: Payer: Self-pay | Admitting: *Deleted

## 2014-03-03 ENCOUNTER — Other Ambulatory Visit: Payer: Self-pay | Admitting: Emergency Medicine

## 2014-03-04 ENCOUNTER — Other Ambulatory Visit: Payer: Self-pay | Admitting: Internal Medicine

## 2014-03-04 MED ORDER — GABAPENTIN 300 MG PO CAPS: 300.0000 mg | ORAL_CAPSULE | Freq: Three times a day (TID) | ORAL | Status: DC

## 2014-03-11 ENCOUNTER — Ambulatory Visit: Payer: Self-pay

## 2014-03-11 ENCOUNTER — Ambulatory Visit: Payer: Self-pay | Admitting: Physician Assistant

## 2014-03-11 ENCOUNTER — Ambulatory Visit (INDEPENDENT_AMBULATORY_CARE_PROVIDER_SITE_OTHER): Payer: Medicare Other

## 2014-03-11 DIAGNOSIS — Z79899 Other long term (current) drug therapy: Secondary | ICD-10-CM

## 2014-03-11 DIAGNOSIS — I4891 Unspecified atrial fibrillation: Secondary | ICD-10-CM

## 2014-03-11 NOTE — Progress Notes (Signed)
Patient ID: Katie [name], female   DOB: [birth year], 73 y.o.   MRN: [id number] Patient here today for PT/INR check. Patient is on Coumadin as follows , 3 mg, three times a week and 2 mg, four times a week.

## 2014-03-12 ENCOUNTER — Other Ambulatory Visit: Payer: Self-pay | Admitting: Internal Medicine

## 2014-03-12 LAB — PROTIME-INR
INR: 1.85 — ABNORMAL HIGH (ref <1.50)
Prothrombin Time: 21.3 seconds — ABNORMAL HIGH (ref 11.6–15.2)

## 2014-03-18 ENCOUNTER — Encounter: Payer: Self-pay | Admitting: Physician Assistant

## 2014-03-18 ENCOUNTER — Ambulatory Visit (HOSPITAL_COMMUNITY)
Admission: RE | Admit: 2014-03-18 | Discharge: 2014-03-18 | Disposition: A | Discharge status: Home / Self Care | Payer: Medicare Other | Source: Ambulatory Visit | Attending: Physician Assistant | Admitting: Physician Assistant

## 2014-03-18 ENCOUNTER — Ambulatory Visit: Payer: Medicare Other | Admitting: Physician Assistant

## 2014-03-18 VITALS — BP 118/70 | HR 84 | Temp 100.9°F | Resp 18 | Ht 63.5 in | Wt 196.0 lb

## 2014-03-18 DIAGNOSIS — I517 Cardiomegaly: Secondary | ICD-10-CM | POA: Diagnosis not present

## 2014-03-18 DIAGNOSIS — Z7901 Long term (current) use of anticoagulants: Secondary | ICD-10-CM

## 2014-03-18 DIAGNOSIS — J209 Acute bronchitis, unspecified: Secondary | ICD-10-CM

## 2014-03-18 DIAGNOSIS — I482 Chronic atrial fibrillation, unspecified: Secondary | ICD-10-CM

## 2014-03-18 DIAGNOSIS — I10 Essential (primary) hypertension: Secondary | ICD-10-CM | POA: Diagnosis not present

## 2014-03-18 DIAGNOSIS — Z79899 Other long term (current) drug therapy: Secondary | ICD-10-CM | POA: Diagnosis not present

## 2014-03-18 DIAGNOSIS — R059 Cough, unspecified: Secondary | ICD-10-CM

## 2014-03-18 DIAGNOSIS — R05 Cough: Secondary | ICD-10-CM | POA: Diagnosis not present

## 2014-03-18 DIAGNOSIS — R079 Chest pain, unspecified: Secondary | ICD-10-CM

## 2014-03-18 LAB — CBC WITH DIFFERENTIAL/PLATELET
Basophils Absolute: 0 10*3/uL (ref 0.0–0.1)
Basophils Relative: 0 % (ref 0–1)
EOS PCT: 1 % (ref 0–5)
Eosinophils Absolute: 0.1 10*3/uL (ref 0.0–0.7)
HEMATOCRIT: 47 % — AB (ref 36.0–46.0)
Hemoglobin: 16.4 g/dL — ABNORMAL HIGH (ref 12.0–15.0)
LYMPHS ABS: 2 10*3/uL (ref 0.7–4.0)
LYMPHS PCT: 21 % (ref 12–46)
MCH: 31.5 pg (ref 26.0–34.0)
MCHC: 34.9 g/dL (ref 30.0–36.0)
MCV: 90.2 fL (ref 78.0–100.0)
MONOS PCT: 6 % (ref 3–12)
MPV: 11.2 fL (ref 8.6–12.4)
Monocytes Absolute: 0.6 10*3/uL (ref 0.1–1.0)
NEUTROS ABS: 7 10*3/uL (ref 1.7–7.7)
Neutrophils Relative %: 72 % (ref 43–77)
Platelets: 216 10*3/uL (ref 150–400)
RBC: 5.21 MIL/uL — AB (ref 3.87–5.11)
RDW: 15.1 % (ref 11.5–15.5)
WBC: 9.7 10*3/uL (ref 4.0–10.5)

## 2014-03-18 MED ORDER — AZITHROMYCIN 250 MG PO TABS: ORAL_TABLET | ORAL | Status: AC

## 2014-03-18 MED ORDER — PREDNISONE 10 MG PO TABS: ORAL_TABLET | ORAL | Status: AC

## 2014-03-18 MED ORDER — BENZONATATE 100 MG PO CAPS: 200.0000 mg | ORAL_CAPSULE | Freq: Three times a day (TID) | ORAL | Status: DC | PRN

## 2014-03-18 NOTE — Progress Notes (Signed)
Subjective:    Patient ID: Katie [name], female    DOB: 1940-12-08, 74 y.o.   MRN: [id number]  Shortness of Breath This is a new problem. Episode onset: 1 day ago. The problem occurs constantly. The problem has been unchanged. Associated symptoms include ear pain, a fever, rhinorrhea and wheezing. Pertinent negatives include no abdominal pain, headaches, rash, sore throat or vomiting. Nothing aggravates the symptoms.  Cough This is a new problem. Episode onset: 1 day ago. The problem has been unchanged. The problem occurs constantly. Cough characteristics: yellow-white mucus. Associated symptoms include ear pain, a fever, postnasal drip, rhinorrhea, shortness of breath and wheezing. Pertinent negatives include no chills, headaches, rash or sore throat. The symptoms are aggravated by lying down. Treatments tried: Mucinex 12 hour tablets and Nebulizer treatment at home and Singulair.   Patient states she is having more chest tightness than normal and would like EKG.  She states she cannot describe it and states it "just feels worse than normal". Patient's cardiologist is Dr. Cristopher Peru and was last seen by him on 11/10/12 and was due to see him January 2015.   Current Coumadin Dosing - 3mg  four days a week (12mg ) and 2mg  three days a week (6mg )- total of 18mg  per week.  PT/INR was 1.85 on 03/11/14 GFR= 53 on 11/05/13 Former Smoker- Quit 2004- 2ppd and started smoking at age 45 Tonsillectomy Review of Systems  Constitutional: Positive for fever and fatigue. Negative for chills and diaphoresis.  HENT: Positive for congestion, ear pain, postnasal drip, rhinorrhea, sinus pressure and trouble swallowing. Negative for ear discharge and sore throat.   Eyes: Negative.   Respiratory: Positive for cough, chest tightness, shortness of breath and wheezing.   Cardiovascular: Negative.   Gastrointestinal: Positive for nausea. Negative for vomiting, abdominal pain, diarrhea, constipation and blood in stool.   Genitourinary: Negative.   Musculoskeletal: Negative.   Skin: Negative.  Negative for rash.  Neurological: Negative.  Negative for dizziness, light-headedness and headaches.  Psychiatric/Behavioral: Negative.    Past Medical History  Diagnosis Date  . Atrial fibrillation   . COPD (chronic obstructive pulmonary disease)   . Bronchitis, chronic   . History of Bell's palsy   . Hypertension   . Coronary artery disease   . Migraine headache   . Atrial flutter   . Tobacco abuse   . Osteopenia   . Cervical dysplasia   . Thyroid disease     Hypo  . Elevated cholesterol   . Unspecified vitamin D deficiency   . Family history of anesthesia complication     Hard for daughter to wake up after Anesthesia  . Shortness of breath     with exertion  . Hypothyroidism   . Cystocele     either on bladder or kidneys patient is unsure but stated physician will watch it  . Seasonal allergies   . Dizziness     If patient gets out of bed too fast  . H/O hiatal hernia   . Rosacea conjunctivitis     Right eye is worse than left eye  . Arthritis   . Gout   . Aortic stenosis     mild AS witm mild to mod AR by 11/2012 echo   Current Outpatient Prescriptions on File Prior to Visit  Medication Sig Dispense Refill  . albuterol (PROVENTIL HFA;VENTOLIN HFA) 108 (90 BASE) MCG/ACT inhaler 2 puffs 5 minutes apart every 4 to 6 hours as needed to rescue asthma 1 Inhaler 11  .  allopurinol (ZYLOPRIM) 300 MG tablet TAKE 1 TABLET BY MOUTH DAILY 90 tablet 1  . aspirin 81 MG tablet Take 81 mg by mouth daily.      . benazepril (LOTENSIN) 20 MG tablet TAKE 1 TABLET BY MOUTH DAILY AS NEEDED FOR BLOOD PRESSURE AND HEART 90 tablet 2  . bisoprolol-hydrochlorothiazide (ZIAC) 5-6.25 MG per tablet TAKE 1 TABLET BY MOUTH DAILY AS NEEDED FOR BLOOD PRESSURE AND HEART 90 tablet 3  . cyclobenzaprine (FLEXERIL) 10 MG tablet Take 1/2 to 1 tab TID prn muscle spasm 30 tablet 0  . dexamethasone (DECADRON) 0.1 % ophthalmic  suspension Place 1 drop into both eyes daily.    . digoxin (LANOXIN) 0.25 MG tablet TAKE 1 TABLET BY MOUTH DAILY 90 tablet 3  . fluconazole (DIFLUCAN) 150 MG tablet Take 1 tablet (150 mg total) by mouth daily. 1 tablet 3  . furosemide (LASIX) 80 MG tablet TAKE 1 TABLET BY MOUTH TWICE A DAY AS NEEDED FOR FLUID 90 tablet 2  . gabapentin (NEURONTIN) 300 MG capsule Take 1 capsule (300 mg total) by mouth 3 (three) times daily. 270 capsule 3  . ipratropium (ATROVENT) 0.02 % nebulizer solution     . ipratropium-albuterol (DUONEB) 0.5-2.5 (3) MG/3ML SOLN Use 3 cc  4 x day or every 4 hours if needed for shortness of breath. 360 mL 99  . levothyroxine (SYNTHROID, LEVOTHROID) 50 MCG tablet TAKE 1 TABLET EVERY DAY 90 tablet 3  . montelukast (SINGULAIR) 10 MG tablet Take 1 tablet (10 mg total) by mouth daily as needed. For allergy 90 tablet 3  . pravastatin (PRAVACHOL) 40 MG tablet Take 40 mg by mouth at bedtime.      . prochlorperazine (COMPAZINE) 5 MG tablet TAKE 1 TABLET BY MOUTH 3 TIMES A DAY FOR VERTIGO OR NAUSEA 90 tablet 8  . terconazole (TERAZOL 7) 0.4 % vaginal cream Place 1 applicator vaginally at bedtime. 45 g 2  . Tiotropium Bromide Monohydrate (SPIRIVA RESPIMAT) 2.5 MCG/ACT AERS Use 1 to 2 puff once daily 4 g 99  . warfarin (COUMADIN) 2 MG tablet TAKE 1 TO 2 TABLETS BY MOUTH DAILY OR AS DIRECTED 180 tablet 3   No current facility-administered medications on file prior to visit.   Allergies  Allergen Reactions  . Advair Diskus [Fluticasone-Salmeterol] Other (See Comments)    SKIN CHANGES  . Amiodarone Other (See Comments)    PULMONARY TOXICITY  . Codeine     unknown  . Diovan [Valsartan] Other (See Comments)    HYPOTENSION  . Doxycycline Diarrhea and Other (See Comments)    VISUAL DISTURBANCE  . Flexeril [Cyclobenzaprine] Other (See Comments)    FATIGUE  . Keflex [Cephalexin] Diarrhea  . Verapamil Other (See Comments)    EDEMA     BP 118/70 mmHg  Pulse 84  Temp(Src) 100.9 F (38.3  C) (Temporal)  Resp 18  Ht 5' 3.5" (1.613 m)  Wt 196 lb (88.905 kg)  BMI 34.17 kg/m2  SpO2 98% Wt Readings from Last 3 Encounters:  03/18/14 196 lb (88.905 kg)  01/23/14 193 lb 6.4 oz (87.726 kg)  12/25/13 190 lb (86.183 kg)   Objective:   Physical Exam  Constitutional: She is oriented to person, place, and time. She appears well-developed and well-nourished. She has a sickly appearance. No distress.  HENT:  Head: Normocephalic.  Right Ear: Tympanic membrane, external ear and ear canal normal.  Left Ear: Tympanic membrane, external ear and ear canal normal.  Nose: Nose normal. Right sinus exhibits no  maxillary sinus tenderness and no frontal sinus tenderness. Left sinus exhibits no maxillary sinus tenderness and no frontal sinus tenderness.  Mouth/Throat: Uvula is midline and mucous membranes are normal. Mucous membranes are not pale and not dry. No trismus in the jaw. No uvula swelling. Posterior oropharyngeal erythema present. No oropharyngeal exudate or posterior oropharyngeal edema.  Eyes: Conjunctivae, EOM and lids are normal. Pupils are equal, round, and reactive to light. Right eye exhibits no discharge. Left eye exhibits no discharge. No scleral icterus.  Neck: Trachea normal, normal range of motion and phonation normal. Neck supple. No tracheal tenderness present. No tracheal deviation present.  Cardiovascular: Normal rate, S1 normal, S2 normal and normal pulses.  An irregularly irregular rhythm present. Exam reveals no gallop, no distant heart sounds and no friction rub.   Murmur heard.  Systolic murmur is present with a grade of 2/6  Pulmonary/Chest: Effort normal. No accessory muscle usage or stridor. No respiratory distress. She has decreased breath sounds. She has no wheezes. She has no rhonchi. She has no rales. She exhibits no tenderness.  Abdominal: Soft. Bowel sounds are normal. There is no tenderness. There is no rebound and no guarding.  Lymphadenopathy:  No  tenderness or LAD.  Neurological: She is alert and oriented to person, place, and time. No cranial nerve deficit. Gait normal.  Skin: Skin is warm, dry and intact. No rash noted. She is not diaphoretic. No pallor.  Psychiatric: She has a normal mood and affect. Her speech is normal and behavior is normal. Judgment and thought content normal. Cognition and memory are normal.  Vitals reviewed.  Assessment & Plan:  1. Chest pain, unspecified chest pain type Ordered EKG due to patient stating she is having more chest tightness than normal. - EKG 12-Lead Ordered EKG.    EKG showed chronic atrial fibrillation with HR 85bpm  Axis: Left Axis Deviation  PR: 198ms  QRS: 118ms  Q-Waves: None  ST: No ST depression or elevation  T waves: No T wave inversions.  Block:  LBBB  V5 and V6 have changed since last EKG on 11/05/13, but patient did have similar EKG pattern on 06/22/09.   Dr. Melford Aase and Vicie Mutters, PA-C reviewed EKG and they expressed no concern over changes in V5 or V6 and they stated changes could possibly be from left axis deviation and LBBB. Told patient to schedule follow up appt with Dr. Cristopher Peru (cardiologist) Reminder to go to the ER if any CP, SOB, nausea, dizziness, severe HA, changes vision/speech, left arm numbness and tingling and jaw pain.  2. Acute bronchitis, unspecified organism -Take Z-Pak as prescribed- azithromycin (ZITHROMAX) 250 MG tablet; Take 2 tablets PO on day 1, then 1 tablet PO Q24H x 4 days  Dispense: 6 tablet; Refill: 0 -Take Tessalon Perles as prescribed for cough- benzonatate (TESSALON PERLES) 100 MG capsule; Take 2 capsules (200 mg total) by mouth 3 (three) times daily as needed for cough (Max: 600mg  per day (6 capsules per day)).  Dispense: 120 capsule; Refill: 0 -Take Prednisone as prescribed for inflammation- predniSONE (DELTASONE) 10 MG tablet; Take 3 tablets PO for 2 days, then take 2 tablets PO for 2 days, then take 1 tablet PO for 3 days.  Dispense:  13 tablet; Refill: 0 Continue nebulizer treatments at home every 4 hours.  3. Cough - benzonatate (TESSALON PERLES) 100 MG capsule; Take 2 capsules (200 mg total) by mouth 3 (three) times daily as needed for cough (Max: 600mg  per day (6 capsules per  day)).  Dispense: 120 capsule; Refill: 0 -Ordered chest x-ray to R/O Pneumonia- DG Chest 2 View; Future  4. Encounter for long-term (current) use of medications Will monitor kidney and liver function. - CBC with Differential - BASIC METABOLIC PANEL WITH GFR - Hepatic function panel  5. Long term current use of anticoagulant therapy Told patient to take Coumadin - 3 mg- three times a week and 2 mg- four times a week. - Protime-INR  6. Chronic atrial fibrillation Ordered lab to see what PT/INR was before antibiotic use due to last INR being 1.85. - Protime-INR  Discussed medication effects and SE's.  Pt agreed to treatment plan. Please follow up in 2 weeks for coumadin recheck.  Addendum: Chest x-ray showed: Cardiomegaly, which was on previous chest x-ray on 04/11/13.  "FINDINGS: The heart is enlarged. Aorta is partially calcified and tortuous.  There are no focal consolidations or pleural effusions. No pulmonary edema."  Told patient to continue medications as prescribed and to follow up in 2 weeks for coumadin recheck.  Told patient to let us know if she is having bruising, blood in stool, dark tarry stools or noticing any bleeding.   Keasia Dubose, Stephani Police, PA-C 1:39 PM [facility]

## 2014-03-18 NOTE — Patient Instructions (Addendum)
-Start taking Z-Pak as prescribed -Start taking Prednisone as prescribed for inflammation. -Start taking tessalon perles as prescribed for cough. [facility]  I will call you with results   Take Coumadin as 3 mg- three times a week and 2 mg- four times a week.  Will recheck level in 2 weeks.  Please call the office if you are noticing more bruising, blood in stool or urine, or dark tarry stools.  Please go to ED immediately if you are having more chest pain, shortness of breath, abdominal pain, nausea, vomiting, diarrhea, constipation, fever, chills, sweats, or more fatigue.  If you are not feeling better in 10-14 days, then please call the office.  If the chest x-ray comes back pneumonia, then please follow up in 1 week. Please follow up in 2 weeks for PT/INR recheck.  Warfarin tablets What is this medicine? WARFARIN (WAR far in) is an anticoagulant. It is used to treat or prevent clots in the veins, arteries, lungs, or heart. This medicine may be used for other purposes; ask your health care provider or pharmacist if you have questions. COMMON BRAND NAME(S): Coumadin, Jantoven What should I tell my health care provider before I take this medicine? They need to know if you have any of these conditions: -alcoholism -anemia -bleeding disorders -cancer -diabetes -heart disease -high blood pressure -history of bleeding in the gastrointestinal tract -history of stroke or other brain injury or disease -kidney or liver disease -protein C deficiency -protein S deficiency -psychosis or dementia -recent injury, recent or planned surgery or procedure -an unusual or allergic reaction to warfarin, other medicines, foods, dyes, or preservatives -pregnant or trying to get pregnant -breast-feeding How should I use this medicine? Take this medicine by mouth with a glass of water. Follow the directions on the prescription label. You can take this medicine with or  without food. Take your medicine at the same time each day. Do not take it more often than directed. Do not stop taking except on your doctor's advice. Stopping this medicine may increase your risk of a blood clot. Be sure to refill your prescription before you run out of medicine. If your doctor or healthcare professional calls to change your dose, write down the dose and any other instructions. Always read the dose and instructions back to him or her to make sure you understand them. Tell your doctor or healthcare professional what strength of tablets you have on hand. Ask how many tablets you should take to equal your new dose. Write the date on the new instructions and keep them near your medicine. If you are told to stop taking your medicine until your next blood test, call your doctor or healthcare professional if you do not hear anything within 24 hours of the test to find out your new dose or when to restart your prior dose. A special MedGuide will be given to you by the pharmacist with each prescription and refill. Be sure to read this information carefully each time. Talk to your pediatrician regarding the use of this medicine in children. Special care may be needed. Overdosage: If you think you have taken too much of this medicine contact a poison control center or emergency room at once. NOTE: This medicine is only for you. Do not share this medicine with others. What if I miss a dose? It is important not to miss a dose. If you miss a dose, call your healthcare provider. Take the dose as soon  as possible on the same day. If it is almost time for your next dose, take only that dose. Do not take double or extra doses to make up for a missed dose. What may interact with this medicine? Do not take this medicine with any of the following medications: -agents that prevent or dissolve blood clots -aspirin or other salicylates -danshen -dextrothyroxine -mifepristone -St. John's Wort -red yeast  rice This medicine may also interact with the following medications: -acetaminophen -agents that lower cholesterol -alcohol -allopurinol -amiodarone -antibiotics or medicines for treating bacterial, fungal or viral infections -azathioprine -barbiturate medicines for inducing sleep or treating seizures -certain medicines for diabetes -certain medicines for heart rhythm problems -certain medicines for high blood pressure -chloral hydrate -cisapride -disulfiram -female hormones, including contraceptive or birth control pills -general anesthetics -herbal or dietary products like garlic, ginkgo, ginseng, green tea, or kava kava -influenza virus vaccine -female hormones -medicines for mental depression or psychosis -medicines for some types of cancer -medicines for stomach problems -methylphenidate -NSAIDs, medicines for pain and inflammation, like ibuprofen or naproxen -propoxyphene -quinidine, quinine -raloxifene -seizure or epilepsy medicine like carbamazepine, phenytoin, and valproic acid -steroids like cortisone and prednisone -tamoxifen -thyroid medicine -tramadol -vitamin c, vitamin e, and vitamin K -zafirlukast -zileuton This list may not describe all possible interactions. Give your health care provider a list of all the medicines, herbs, non-prescription drugs, or dietary supplements you use. Also tell them if you smoke, drink alcohol, or use illegal drugs. Some items may interact with your medicine. What should I watch for while using this medicine? Visit your doctor or health care professional for regular checks on your progress. You will need to have a blood test called a PT/INR regularly. The PT/INR blood test is done to make sure you are getting the right dose of this medicine. It is important to not miss your appointment for the blood tests. When you first start taking this medicine, these tests are done often. Once the correct dose is determined and you take your  medicine properly, these tests can be done less often. Notify your doctor or health care professional and seek emergency treatment if you develop breathing problems; changes in vision; chest pain; severe, sudden headache; pain, swelling, warmth in the leg; trouble speaking; sudden numbness or weakness of the face, arm or leg. These can be signs that your condition has gotten worse. While you are taking this medicine, carry an identification card with your name, the name and dose of medicine(s) being used, and the name and phone number of your doctor or health care professional or person to contact in an emergency. Do not start taking or stop taking any medicines or over-the-counter medicines except on the advice of your doctor or health care professional. You should discuss your diet with your doctor or health care professional. Do not make major changes in your diet. Vitamin K can affect how well this medicine works. Many foods contain vitamin K. It is important to eat a consistent amount of foods with vitamin K. Other foods with vitamin K that you should eat in consistent amounts are asparagus, basil, beef or pork liver, black eyed peas, broccoli, brussel sprouts, cabbage, chickpeas, cucumber with peel, green onions, green tea, okra, parsley, peas, thyme, and green leafy vegetables like beet greens, collard greens, endive, kale, mustard greens, spinach, turnip greens, watercress, or certain lettuces like green leaf or romaine. This medicine can cause birth defects or bleeding in an unborn child. Women of childbearing age should use  effective birth control while taking this medicine. If a woman becomes pregnant while taking this medicine, she should discuss the potential risks and her options with her health care professional. Avoid sports and activities that might cause injury while you are using this medicine. Severe falls or injuries can cause unseen bleeding. Be careful when using sharp tools or knives.  Consider using an Copy. Take special care brushing or flossing your teeth. Report any injuries, bruising, or red spots on the skin to your doctor or health care professional. If you have an illness that causes vomiting, diarrhea, or fever for more than a few days, contact your doctor. Also check with your doctor if you are unable to eat for several days. These problems can change the effect of this medicine. Even after you stop taking this medicine, it takes several days before your body recovers its normal ability to clot blood. Ask your doctor or health care professional how long you need to be careful. If you are going to have surgery or dental work, tell your doctor or health care professional that you have been taking this medicine. What side effects may I notice from receiving this medicine? Side effects that you should report to your doctor or health care professional as soon as possible: -back pain -chills -dizziness -fever -heavy menstrual bleeding or vaginal bleeding -painful, blue, or purple toes -painful, prolonged erection -signs and symptoms of bleeding such as bloody or black, tarry stools; red or dark-brown urine; spitting up blood or brown material that looks like coffee grounds; red spots on the skin; unusual bruising or bleeding from the eye, gums, or nose-skin rash, itching or skin damage -stomach pain -unusually weak or tired -yellowing of skin or eyes Side effects that usually do not require medical attention (report to your doctor or health care professional if they continue or are bothersome): -diarrhea -hair loss This list may not describe all possible side effects. Call your doctor for medical advice about side effects. You may report side effects to FDA at 1-800-FDA-1088. Where should I keep my medicine? Keep out of the reach of children. Store at room temperature between 15 and 30 degrees C (59 and 86 degrees F). Protect from light. Throw away any unused  medicine after the expiration date. Do not flush down the toilet. NOTE: This sheet is a summary. It may not cover all possible information. If you have questions about this medicine, talk to your doctor, pharmacist, or health care provider.  2015, [employer]vier/Gold Standard. (2012-09-20 12:17:56)    Acute Bronchitis Bronchitis is inflammation of the airways that extend from the windpipe into the lungs (bronchi). The inflammation often causes mucus to develop. This leads to a cough, which is the most common symptom of bronchitis.  In acute bronchitis, the condition usually develops suddenly and goes away over time, usually in a couple weeks. Smoking, allergies, and asthma can make bronchitis worse. Repeated episodes of bronchitis may cause further lung problems.  CAUSES Acute bronchitis is most often caused by the same virus that causes a cold. The virus can spread from person to person (contagious) through coughing, sneezing, and touching contaminated objects. SIGNS AND SYMPTOMS   Cough.   Fever.   Coughing up mucus.   Body aches.   Chest congestion.   Chills.   Shortness of breath.   Sore throat.  DIAGNOSIS  Acute bronchitis is usually diagnosed through a physical exam. Your health care provider will also ask you questions about your medical history. Tests,  such as chest X-rays, are sometimes done to rule out other conditions.  TREATMENT  Acute bronchitis usually goes away in a couple weeks. Oftentimes, no medical treatment is necessary. Medicines are sometimes given for relief of fever or cough. Antibiotic medicines are usually not needed but may be prescribed in certain situations. In some cases, an inhaler may be recommended to help reduce shortness of breath and control the cough. A cool mist vaporizer may also be used to help thin bronchial secretions and make it easier to clear the chest.  HOME CARE INSTRUCTIONS  Get plenty of rest.   Drink enough fluids to keep your  urine clear or pale yellow (unless you have a medical condition that requires fluid restriction). Increasing fluids may help thin your respiratory secretions (sputum) and reduce chest congestion, and it will prevent dehydration.   Take medicines only as directed by your health care provider.  If you were prescribed an antibiotic medicine, finish it all even if you start to feel better.  Avoid smoking and secondhand smoke. Exposure to cigarette smoke or irritating chemicals will make bronchitis worse. If you are a smoker, consider using nicotine gum or skin patches to help control withdrawal symptoms. Quitting smoking will help your lungs heal faster.   Reduce the chances of another bout of acute bronchitis by washing your hands frequently, avoiding people with cold symptoms, and trying not to touch your hands to your mouth, nose, or eyes.   Keep all follow-up visits as directed by your health care provider.  SEEK MEDICAL CARE IF: Your symptoms do not improve after 1 week of treatment.  SEEK IMMEDIATE MEDICAL CARE IF:  You develop an increased fever or chills.   You have chest pain.   You have severe shortness of breath.  You have bloody sputum.   You develop dehydration.  You faint or repeatedly feel like you are going to pass out.  You develop repeated vomiting.  You develop a severe headache. MAKE SURE YOU:   Understand these instructions.  Will watch your condition.  Will get help right away if you are not doing well or get worse. Document Released: 04/08/2004 Document Revised: 07/16/2013 Document Reviewed: 08/22/2012 [facility] Patient Information 2015 [place], [place]. This information is not intended to replace advice given to you by your health care provider. Make sure you discuss any questions you have with your health care provider.

## 2014-03-19 LAB — PROTIME-INR
INR: 1.77 — ABNORMAL HIGH (ref <1.50)
Prothrombin Time: 20.6 seconds — ABNORMAL HIGH (ref 11.6–15.2)

## 2014-03-19 LAB — HEPATIC FUNCTION PANEL
ALK PHOS: 44 U/L (ref 39–117)
ALT: 16 U/L (ref 0–35)
AST: 15 U/L (ref 0–37)
Albumin: 4 g/dL (ref 3.5–5.2)
BILIRUBIN INDIRECT: 0.6 mg/dL (ref 0.2–1.2)
Bilirubin, Direct: 0.2 mg/dL (ref 0.0–0.3)
Total Bilirubin: 0.8 mg/dL (ref 0.2–1.2)
Total Protein: 6.8 g/dL (ref 6.0–8.3)

## 2014-03-19 LAB — BASIC METABOLIC PANEL WITH GFR
BUN: 22 mg/dL (ref 6–23)
CALCIUM: 9.9 mg/dL (ref 8.4–10.5)
CO2: 24 mEq/L (ref 19–32)
Chloride: 102 mEq/L (ref 96–112)
Creat: 1.14 mg/dL — ABNORMAL HIGH (ref 0.50–1.10)
GFR, EST AFRICAN AMERICAN: 56 mL/min — AB
GFR, EST NON AFRICAN AMERICAN: 48 mL/min — AB
GLUCOSE: 86 mg/dL (ref 70–99)
Potassium: 4.3 mEq/L (ref 3.5–5.3)
SODIUM: 140 meq/L (ref 135–145)

## 2014-03-20 ENCOUNTER — Telehealth: Payer: Self-pay | Admitting: *Deleted

## 2014-03-20 NOTE — Telephone Encounter (Signed)
Called patient and reminded her about drug interaction with Digoxin and Zpak.  Advised patient to monitor for increased heart rate and patient will f/u in 2 weeks for recheck dig level and PT?INR as instructed by Dewayne Shorter, PA-C.

## 2014-04-02 ENCOUNTER — Ambulatory Visit (INDEPENDENT_AMBULATORY_CARE_PROVIDER_SITE_OTHER): Payer: Medicare Other | Admitting: Physician Assistant

## 2014-04-02 ENCOUNTER — Encounter: Payer: Self-pay | Admitting: Physician Assistant

## 2014-04-02 VITALS — BP 122/70 | HR 68 | Temp 98.6°F | Resp 16 | Ht 63.5 in | Wt 197.0 lb

## 2014-04-02 DIAGNOSIS — I482 Chronic atrial fibrillation, unspecified: Secondary | ICD-10-CM

## 2014-04-02 DIAGNOSIS — Z7901 Long term (current) use of anticoagulants: Secondary | ICD-10-CM

## 2014-04-02 DIAGNOSIS — Z5181 Encounter for therapeutic drug level monitoring: Secondary | ICD-10-CM | POA: Diagnosis not present

## 2014-04-02 DIAGNOSIS — Z79899 Other long term (current) drug therapy: Secondary | ICD-10-CM

## 2014-04-02 DIAGNOSIS — J449 Chronic obstructive pulmonary disease, unspecified: Secondary | ICD-10-CM | POA: Diagnosis not present

## 2014-04-02 LAB — PROTIME-INR
INR: 2.43 — ABNORMAL HIGH (ref <1.50)
PROTHROMBIN TIME: 26.4 s — AB (ref 11.6–15.2)

## 2014-04-02 MED ORDER — TIOTROPIUM BROMIDE MONOHYDRATE 18 MCG IN CAPS: 18.0000 ug | ORAL_CAPSULE | Freq: Every day | RESPIRATORY_TRACT | Status: DC

## 2014-04-02 MED ORDER — TIOTROPIUM BROMIDE MONOHYDRATE 2.5 MCG/ACT IN AERS: INHALATION_SPRAY | RESPIRATORY_TRACT | Status: DC

## 2014-04-02 NOTE — Progress Notes (Signed)
Coumadin follow up  Patient is on Coumadin for Chronic Atrial Fibrillation.  She was recently prescribed antibiotic (Z-Pak) on 03/18/14 and we adjusted her Coumadin to accommodate the antibiotic.  Patient states she is feeling better, except for some SOB and has not been using Spiriva inhaler. Patient's last INR is  Lab Results  Component Value Date   INR 1.77* 03/18/2014   INR 1.85* 03/11/2014   INR 1.96* 01/23/2014    Current dose of Coumadin- Told patient to take 3 mg- three times a week and 2 mg- four times a week.  However, She states has been doing 3mg -4 times a week and 2mg -3 times a week.  Patient denies CP, dizziness, nose bleeds, easy bleeding, and blood in stool/urine. Her coumadin dose was changed last visit. She has taken ABX, has not missed any doses and denies a fall.     Current Outpatient Prescriptions on File Prior to Visit  Medication Sig Dispense Refill  . albuterol (PROVENTIL HFA;VENTOLIN HFA) 108 (90 BASE) MCG/ACT inhaler 2 puffs 5 minutes apart every 4 to 6 hours as needed to rescue asthma 1 Inhaler 11  . allopurinol (ZYLOPRIM) 300 MG tablet TAKE 1 TABLET BY MOUTH DAILY 90 tablet 1  . aspirin 81 MG tablet Take 81 mg by mouth daily.      . benazepril (LOTENSIN) 20 MG tablet TAKE 1 TABLET BY MOUTH DAILY AS NEEDED FOR BLOOD PRESSURE AND HEART 90 tablet 2  . benzonatate (TESSALON PERLES) 100 MG capsule Take 2 capsules (200 mg total) by mouth 3 (three) times daily as needed for cough (Max: 600mg  per day (6 capsules per day)). 120 capsule 0  . bisoprolol-hydrochlorothiazide (ZIAC) 5-6.25 MG per tablet TAKE 1 TABLET BY MOUTH DAILY AS NEEDED FOR BLOOD PRESSURE AND HEART 90 tablet 3  . cyclobenzaprine (FLEXERIL) 10 MG tablet Take 1/2 to 1 tab TID prn muscle spasm 30 tablet 0  . dexamethasone (DECADRON) 0.1 % ophthalmic suspension Place 1 drop into both eyes daily.    . digoxin (LANOXIN) 0.25 MG tablet TAKE 1 TABLET BY MOUTH DAILY 90 tablet 3  . fluconazole (DIFLUCAN) 150 MG  tablet Take 1 tablet (150 mg total) by mouth daily. 1 tablet 3  . furosemide (LASIX) 80 MG tablet TAKE 1 TABLET BY MOUTH TWICE A DAY AS NEEDED FOR FLUID 90 tablet 2  . gabapentin (NEURONTIN) 300 MG capsule Take 1 capsule (300 mg total) by mouth 3 (three) times daily. 270 capsule 3  . ipratropium (ATROVENT) 0.02 % nebulizer solution     . ipratropium-albuterol (DUONEB) 0.5-2.5 (3) MG/3ML SOLN Use 3 cc  4 x day or every 4 hours if needed for shortness of breath. 360 mL 99  . levothyroxine (SYNTHROID, LEVOTHROID) 50 MCG tablet TAKE 1 TABLET EVERY DAY 90 tablet 3  . montelukast (SINGULAIR) 10 MG tablet Take 1 tablet (10 mg total) by mouth daily as needed. For allergy 90 tablet 3  . pravastatin (PRAVACHOL) 40 MG tablet Take 40 mg by mouth at bedtime.      . prochlorperazine (COMPAZINE) 5 MG tablet TAKE 1 TABLET BY MOUTH 3 TIMES A DAY FOR VERTIGO OR NAUSEA 90 tablet 8  . terconazole (TERAZOL 7) 0.4 % vaginal cream Place 1 applicator vaginally at bedtime. 45 g 2  . Tiotropium Bromide Monohydrate (SPIRIVA RESPIMAT) 2.5 MCG/ACT AERS Use 1 to 2 puff once daily 4 g 99  . warfarin (COUMADIN) 2 MG tablet TAKE 1 TO 2 TABLETS BY MOUTH DAILY OR AS DIRECTED 180 tablet  3   No current facility-administered medications on file prior to visit.   Past Medical History  Diagnosis Date  . Atrial fibrillation   . COPD (chronic obstructive pulmonary disease)   . Bronchitis, chronic   . History of Bell's palsy   . Hypertension   . Coronary artery disease   . Migraine headache   . Atrial flutter   . Tobacco abuse   . Osteopenia   . Cervical dysplasia   . Thyroid disease     Hypo  . Elevated cholesterol   . Unspecified vitamin D deficiency   . Family history of anesthesia complication     Hard for daughter to wake up after Anesthesia  . Shortness of breath     with exertion  . Hypothyroidism   . Cystocele     either on bladder or kidneys patient is unsure but stated physician will watch it  . Seasonal  allergies   . Dizziness     If patient gets out of bed too fast  . H/O hiatal hernia   . Rosacea conjunctivitis     Right eye is worse than left eye  . Arthritis   . Gout   . Aortic stenosis     mild AS witm mild to mod AR by 11/2012 echo   Allergies  Allergen Reactions  . Advair Diskus [Fluticasone-Salmeterol] Other (See Comments)    SKIN CHANGES.  Can tolerate low dose of prednisone without complications  . Amiodarone Other (See Comments)    PULMONARY TOXICITY  . Codeine     unknown  . Diovan [Valsartan] Other (See Comments)    HYPOTENSION  . Doxycycline Diarrhea and Other (See Comments)    VISUAL DISTURBANCE  . Flexeril [Cyclobenzaprine] Other (See Comments)    FATIGUE  . Keflex [Cephalexin] Diarrhea  . Verapamil Other (See Comments)    EDEMA   ROS Constitutional: Denies fever, chills, headaches, fatigue. Cardio: Denies chest pain, palpitations, irregular heartbeat, syncope, dyspnea, diaphoresis, orthopnea, PND, claudication, edema Respiratory: Reports SOB.  Denies cough, pleurisy, hoarseness, laryngitis, wheezing.  Gastrointestinal: Denies dysphagia, heartburn, reflux, pain, cramps, nausea, diarrhea, constipation, hematemesis, melena, hematochezia Genitourinary: Denies dysuria, frequency, hematuria, flank pain Musculoskeletal: Denies arthralgia, myalgia, stiffness, Jt. Swelling, pain, limp, strain/sprain. Skin: Denies rash, ecchymosis, petechial. Neuro: Denies Weakness, tremor, incoordination, spasms, paresthesia, pain Heme/Lymph: Denies Excessive bleeding, bruising, enlarged lymph nodes  Physical: Blood pressure 122/70, pulse 68, temperature 98.6 F (37 C), temperature source Temporal, resp. rate 16, height 5' 3.5" (1.613 m), weight 197 lb (89.359 kg). Wt Readings from Last 3 Encounters:  04/02/14 197 lb (89.359 kg)  03/18/14 196 lb (88.905 kg)  01/23/14 193 lb 6.4 oz (87.726 kg)  Vitals Reviewed. General Appearance: Well nourished, in no apparent  distress. ENT/Mouth: Nares clear with no erythema, swelling or bleeding. Mucus on turbinates. No ulcers, cracking, on lips. No erythema, swelling, or exudate on posterior pharynx.  Neck: Supple, thyroid normal.  Respiratory: CTAB.  No w/r/r or stridor.  No increased effort of breathing.   Cardio: Irregularly, irregular rhythm, Grade 2/6 systolic murmur. No rubs or gallops. No edema  Abdomen: Soft, with normal bowel sounds. Non tender, no guarding, rebound, hernias, masses, or organomegaly.  Skin: Warm, dry without rashes, lesions, ecchymosis.  Neuro: Unremarkable  Assessment and plan: 1. Chronic atrial fibrillation and Long term current use of anticoagulant therapy Chronic anticoagulation- check INR and will adjust medication according to labs.  Discussed if patient falls to immediately contact office or go to ER. Discussed foods  that can increase or decrease Coumadin levels. Patient understands to call the office before starting a new medication. - Protime-INR  2. Encounter for monitoring digoxin therapy Check digoxin level. - Digoxin level  3. Chronic obstructive pulmonary disease, unspecified COPD, unspecified chronic bronchitis type Please take either the Spiriva handihaler or respimat daily (Only use one device per day).  Patient wants to try respimat again.   - Tiotropium Bromide Monohydrate (SPIRIVA RESPIMAT) 2.5 MCG/ACT AERS; Use 1 to 2 puff once daily  Dispense: 4 g; Refill: 99 - tiotropium (SPIRIVA HANDIHALER) 18 MCG inhalation capsule; Place 1 capsule (18 mcg total) into inhaler and inhale daily.  Dispense: 30 capsule; Refill: 2  Discussed medication effects and SE's.  Pt agreed to treatment plan. Please follow up in 1 month or sooner if INR is low.  Rhianon Zabawa, Stephani Police, PA-C 10:27 AM [facility]

## 2014-04-02 NOTE — Patient Instructions (Addendum)
-continue Coumadin as prescribed.- 3mg - 4 times a week and 2mg - 3 times a week. -Please start taking Spiriva again.  Gave you inhaler with capsule and respimat.  Only use one inhaler per day.  They are the same medication.  Please follow up in 1 month or sooner.   Warfarin: What You Need to Know Warfarin is an anticoagulant. Anticoagulants help prevent the formation of blood clots. They also help stop the growth of blood clots. Warfarin is sometimes referred to as a "blood thinner."  Normally, when body tissues are cut or damaged, the blood clots in order to prevent blood loss. Sometimes clots form inside your blood vessels and obstruct the flow of blood through your circulatory system (thrombosis). These clots may travel through your bloodstream and become lodged in smaller blood vessels in your brain, which can cause a stroke, or in your lungs (pulmonary embolism). WHO SHOULD USE WARFARIN? Warfarin is prescribed for people at risk of developing harmful blood clots:  People with surgically implanted mechanical heart valves, irregular heart rhythms called atrial fibrillation, and certain clotting disorders.  People who have developed harmful blood clotting in the past, including those who have had a stroke or a pulmonary embolism, or thrombosis in their legs (deep vein thrombosis [DVT]).  People with an existing blood clot, such as a pulmonary embolism. WARFARIN DOSING Warfarin tablets come in different strengths. Each tablet strength is a different color, with the amount of warfarin (in milligrams) clearly printed on the tablet. If the color of your tablet is different than usual when you receive a new prescription, report it immediately to your pharmacist or health care provider. WARFARIN MONITORING The goal of warfarin therapy is to lessen the clotting tendency of blood but not prevent clotting completely. Your health care provider will monitor the anticoagulation effect of warfarin closely  and adjust your dose as needed. For your safety, blood tests called prothrombin time (PT) or international normalized ratio (INR) are used to measure the effects of warfarin. Both of these tests can be done with a finger stick or a blood draw. The longer it takes the blood to clot, the higher the PT or INR. Your health care provider will inform you of your "target" PT or INR range. If, at any time, your PT or INR is above the target range, there is a risk of bleeding. If your PT or INR is below the target range, there is a risk of clotting. Whether you are started on warfarin while you are in the hospital or in your health care provider's office, you will need to have your PT or INR checked within one week of starting the medicine. Initially, some people are asked to have their PT or INR checked as much as twice a week. Once you are on a stable maintenance dose, the PT or INR is checked less often, usually once every 2 to 4 weeks. The warfarin dose may be adjusted if the PT or INR is not within the target range. It is important to keep all laboratory and health care provider follow-up appointments. Not keeping appointments could result in a chronic or permanent injury, pain, or disability because warfarin is a medicine that requires close monitoring. WHAT ARE THE SIDE EFFECTS OF WARFARIN?  Too much warfarin can cause bleeding (hemorrhage) from any part of the body. This may include bleeding from the gums, blood in the urine, bloody or dark stools, a nosebleed that is not easily stopped, coughing up blood, or vomiting blood.  Too little warfarin can increase the risk of blood clots.  Too little or too much warfarin can also increase the risk of a stroke.  Warfarin use may cause a skin rash or irritation, an unusual fever, continual nausea or stomach upset, or severe pain in your joints or back. SPECIAL PRECAUTIONS WHILE TAKING WARFARIN Warfarin should be taken exactly as directed. It is very important  to take warfarin as directed since bleeding or blood clots could result in chronic or permanent injury, pain, or disability.  Take your medicine at the same time every day. If you forget to take your dose, you can take it if it is within 6 hours of when it was due.  Do not change the dose of warfarin on your own to make up for missed or extra doses.  If you miss more than 2 doses in a row, you should contact your health care provider for advice. Avoid situations that cause bleeding. You may have a tendency to bleed more easily than usual while taking warfarin. The following actions can limit bleeding:  Using a softer toothbrush.  Flossing with waxed floss rather than unwaxed floss.  Shaving with an Copy rather than a blade.  Limiting the use of sharp objects.  Avoiding potentially harmful activities, such as contact sports. Warfarin and Pregnancy or Breastfeeding  Warfarin is not advised during the first trimester of pregnancy due to an increased risk of birth defects. In certain situations, a woman may take warfarin after her first trimester of pregnancy. A woman who becomes pregnant or plans to become pregnant while taking warfarin should notify her health care provider immediately.  Although warfarin does not pass into breast milk, a woman who wishes to breastfeed while taking warfarin should also consult with her health care provider. Alcohol, Smoking, and Illicit Drug Use  Alcohol affects how warfarin works in the body. It is best to avoid alcoholic drinks or consume very small amounts while taking warfarin. In general, alcohol intake should be limited to 1 oz (30 mL) of liquor, 6 oz (180 mL) of wine, or 12 oz (360 mL) of beer each day. Notify your health care provider if you change your alcohol intake.  Smoking affects how warfarin works. It is best to avoid smoking while taking warfarin. Notify your health care provider if you change your smoking habits.  It is best to  avoid all illicit drugs while taking warfarin since there are few studies that show how warfarin interacts with these drugs. Other Medicines and Dietary Supplements Many prescription and over-the-counter medicines can interfere with warfarin. Be sure all of your health care providers know you are taking warfarin. Notify your health care provider who prescribed warfarin for you or your pharmacist before starting or stopping any new medicines, including over-the-counter vitamins, dietary supplements, and pain medicines. Your warfarin dose may need to be adjusted. Some common over-the-counter medicines that may increase the risk of bleeding while taking warfarin include:   Acetaminophen.  Aspirin.  Nonsteroidal anti-inflammatory medicines (NSAIDs), such as ibuprofen or naproxen.  Vitamin E. Dietary Considerations  Foods that have moderate or high amounts of vitamin K can interfere with warfarin. Avoid major changes in your diet or notify your health care provider before changing your diet. Eat a consistent amount of foods that have moderate or high amounts of vitamin K. Eating less foods containing vitamin K can increase the risk of bleeding. Eating more foods containing vitamin K can increase the risk of blood clots. Additional  questions about dietary considerations can be discussed with a dietitian. Foods that are very high in vitamin K:  Greens, such as Swiss chard and beet, collard, mustard, or turnip greens (fresh or frozen, cooked).  Kale (fresh or frozen, cooked).  Parsley (raw).  Spinach (cooked). Foods that are high in vitamin K:  Asparagus (frozen, cooked).  Beans, green (frozen, cooked).  Broccoli.  Bok choy (cooked).  Brussels sprouts (fresh or frozen, cooked).  Cabbage (cooked).   Coleslaw. Foods that are moderately high in vitamin K:  Blueberries.  Black-eyed peas.  Endive (raw).  Green leaf lettuce (raw).  Green scallions (raw).  Kale (raw).  Okra  (frozen, cooked).  Plantains (fried).  Romaine lettuce (raw).  Sauerkraut (canned).  Spinach (raw). CALL YOUR CLINIC OR HEALTH CARE PROVIDER IF YOU:  Plan to have any surgery or procedure.  Feel sick, especially if you have diarrhea or vomiting.  Experience or anticipate any major changes in your diet.  Start or stop a prescription or over-the-counter medicine.  Become, plan to become, or think you may be pregnant.  Are having heavier than usual menstrual periods.  Have had a fall, accident, or any symptoms of bleeding or unusual bruising.  Develop an unusual fever. CALL 911 IN THE [place] OR GO TO THE EMERGENCY DEPARTMENT IF YOU:   Think you may be having an allergic reaction to warfarin. The signs of an allergic reaction could include itching, rash, hives, swelling, chest tightness, or trouble breathing.  See signs of blood in your urine. The signs could include reddish, pinkish, or tea-colored urine.  See signs of blood in your stools. The signs could include bright red or black stools.  Vomit or cough up blood. In these instances, the blood could have either a bright red or a "coffee-grounds" appearance.  Have bleeding that will not stop after applying pressure for 30 minutes such as cuts, nosebleeds, or other injuries.  Have severe pain in your joints or back.  Have a new and severe headache.  Have sudden weakness or numbness of your face, arm, or leg, especially on one side of your body.  Have sudden confusion or trouble understanding.  Have sudden trouble seeing in one or both eyes.  Have sudden trouble walking, dizziness, loss of balance, or coordination.  Have trouble speaking or understanding (aphasia). Document Released: 03/01/2005 Document Revised: 07/16/2013 Document Reviewed: 08/25/2012 [facility] Patient Information 2015 [place], [place]. This information is not intended to replace advice given to you by your health care provider. Make sure you discuss any  questions you have with your health care provider.

## 2014-04-03 LAB — DIGOXIN LEVEL: DIGOXIN LVL: 1 ng/mL (ref 0.8–2.0)

## 2014-04-11 ENCOUNTER — Other Ambulatory Visit: Payer: Self-pay | Admitting: Vascular Surgery

## 2014-04-11 DIAGNOSIS — Z01812 Encounter for preprocedural laboratory examination: Secondary | ICD-10-CM | POA: Diagnosis not present

## 2014-04-11 LAB — BUN: BUN: 16 mg/dL (ref 6–23)

## 2014-04-11 LAB — CREATININE, SERUM: CREATININE: 1.2 mg/dL — AB (ref 0.50–1.10)

## 2014-04-12 ENCOUNTER — Ambulatory Visit: Payer: Self-pay | Admitting: Physician Assistant

## 2014-04-15 ENCOUNTER — Encounter: Payer: Self-pay | Admitting: Vascular Surgery

## 2014-04-16 ENCOUNTER — Ambulatory Visit (HOSPITAL_COMMUNITY)
Admission: RE | Admit: 2014-04-16 | Discharge: 2014-04-16 | Disposition: A | Discharge status: Home / Self Care | Payer: Medicare Other | Source: Ambulatory Visit | Attending: Vascular Surgery | Admitting: Vascular Surgery

## 2014-04-16 ENCOUNTER — Ambulatory Visit (INDEPENDENT_AMBULATORY_CARE_PROVIDER_SITE_OTHER): Payer: Medicare Other | Admitting: Vascular Surgery

## 2014-04-16 ENCOUNTER — Ambulatory Visit: Payer: No Typology Code available for payment source | Admitting: Internal Medicine

## 2014-04-16 ENCOUNTER — Ambulatory Visit
Admission: RE | Admit: 2014-04-16 | Discharge: 2014-04-16 | Disposition: A | Discharge status: Home / Self Care | Payer: Medicare Other | Source: Ambulatory Visit | Attending: Vascular Surgery | Admitting: Vascular Surgery

## 2014-04-16 ENCOUNTER — Encounter: Payer: Self-pay | Admitting: Vascular Surgery

## 2014-04-16 VITALS — BP 134/87 | HR 85 | Resp 16 | Ht 64.0 in | Wt 189.0 lb

## 2014-04-16 DIAGNOSIS — I739 Peripheral vascular disease, unspecified: Secondary | ICD-10-CM

## 2014-04-16 DIAGNOSIS — I714 Abdominal aortic aneurysm, without rupture, unspecified: Secondary | ICD-10-CM

## 2014-04-16 DIAGNOSIS — Z48812 Encounter for surgical aftercare following surgery on the circulatory system: Secondary | ICD-10-CM

## 2014-04-16 MED ORDER — IOHEXOL 350 MG/ML SOLN
75.0000 mL | Freq: Once | INTRAVENOUS | Status: AC | PRN
  Administered 2014-04-16: 75 mL via INTRAVENOUS

## 2014-04-16 NOTE — Progress Notes (Signed)
Subjective:     Patient ID: Katie [name], female   DOB: 12-10-1940, 74 y.o.   MRN: [id number]  HPI this 74 year old female returns for follow-up regarding her aortic aneurysm stent graft which I performed 04/11/2013 with right femoral endarterectomy and angioplasty. She has had right buttock and thigh claudication type symptoms since her surgery. These are quite stable today. This usually occurs after walking about 5 minutes and if she rests for a few minutes it resolves. Sometimes she can then walk as far as she likes without symptoms. She has no symptoms in the left leg. She does not have rest pain or nonhealing ulcers or infection. She does not think this is limiting her at the present time. She does have chronic A. fib and is seen by Dr. Crissie Sickles. She is on chronic Coumadin therapy. She gets dyspnea on exertion which also limits her walking.  Past Medical History  Diagnosis Date  . Atrial fibrillation   . COPD (chronic obstructive pulmonary disease)   . Bronchitis, chronic   . History of Bell's palsy   . Hypertension   . Coronary artery disease   . Migraine headache   . Atrial flutter   . Tobacco abuse   . Osteopenia   . Cervical dysplasia   . Thyroid disease     Hypo  . Elevated cholesterol   . Unspecified vitamin D deficiency   . Family history of anesthesia complication     Hard for daughter to wake up after Anesthesia  . Shortness of breath     with exertion  . Hypothyroidism   . Cystocele     either on bladder or kidneys patient is unsure but stated physician will watch it  . Seasonal allergies   . Dizziness     If patient gets out of bed too fast  . H/O hiatal hernia   . Rosacea conjunctivitis     Right eye is worse than left eye  . Arthritis   . Gout   . Aortic stenosis     mild AS witm mild to mod AR by 11/2012 echo    History  Substance Use Topics  . Smoking status: Former Smoker    Types: Cigarettes    Quit date: 08/10/2002  . Smokeless tobacco: Never  Used     Comment: History of tobacco abuse  . Alcohol Use: 0.0 oz/week     Comment: rare    Family History  Problem Relation Age of Onset  . Cirrhosis Mother   . Cancer Mother 52    PANCREAS  . Heart defect Sister   . Breast cancer Sister     age 70  . Heart disease Sister   . Stroke Sister   . Alcohol abuse Father   . Depression Father   . Hypertension Brother   . Hyperlipidemia Son   . Heart disease Daughter     Allergies  Allergen Reactions  . Advair Diskus [Fluticasone-Salmeterol] Other (See Comments)    SKIN CHANGES.  Can tolerate low dose of prednisone without complications  . Amiodarone Other (See Comments)    PULMONARY TOXICITY  . Codeine     unknown  . Diovan [Valsartan] Other (See Comments)    HYPOTENSION  . Doxycycline Diarrhea and Other (See Comments)    VISUAL DISTURBANCE  . Flexeril [Cyclobenzaprine] Other (See Comments)    FATIGUE  . Keflex [Cephalexin] Diarrhea  . Verapamil Other (See Comments)    EDEMA     Current outpatient prescriptions:  .  albuterol (PROVENTIL HFA;VENTOLIN HFA) 108 (90 BASE) MCG/ACT inhaler, 2 puffs 5 minutes apart every 4 to 6 hours as needed to rescue asthma, Disp: 1 Inhaler, Rfl: 11 .  allopurinol (ZYLOPRIM) 300 MG tablet, TAKE 1 TABLET BY MOUTH DAILY, Disp: 90 tablet, Rfl: 1 .  aspirin 81 MG tablet, Take 81 mg by mouth daily.  , Disp: , Rfl:  .  benazepril (LOTENSIN) 20 MG tablet, TAKE 1 TABLET BY MOUTH DAILY AS NEEDED FOR BLOOD PRESSURE AND HEART, Disp: 90 tablet, Rfl: 2 .  bisoprolol-hydrochlorothiazide (ZIAC) 5-6.25 MG per tablet, TAKE 1 TABLET BY MOUTH DAILY AS NEEDED FOR BLOOD PRESSURE AND HEART, Disp: 90 tablet, Rfl: 3 .  dexamethasone (DECADRON) 0.1 % ophthalmic suspension, Place 1 drop into both eyes daily., Disp: , Rfl:  .  digoxin (LANOXIN) 0.25 MG tablet, TAKE 1 TABLET BY MOUTH DAILY, Disp: 90 tablet, Rfl: 3 .  furosemide (LASIX) 80 MG tablet, TAKE 1 TABLET BY MOUTH TWICE A DAY AS NEEDED FOR FLUID, Disp: 90  tablet, Rfl: 2 .  gabapentin (NEURONTIN) 300 MG capsule, Take 1 capsule (300 mg total) by mouth 3 (three) times daily., Disp: 270 capsule, Rfl: 3 .  ipratropium (ATROVENT) 0.02 % nebulizer solution, , Disp: , Rfl:  .  ipratropium-albuterol (DUONEB) 0.5-2.5 (3) MG/3ML SOLN, Use 3 cc  4 x day or every 4 hours if needed for shortness of breath., Disp: 360 mL, Rfl: 99 .  levothyroxine (SYNTHROID, LEVOTHROID) 50 MCG tablet, TAKE 1 TABLET EVERY DAY, Disp: 90 tablet, Rfl: 3 .  montelukast (SINGULAIR) 10 MG tablet, Take 1 tablet (10 mg total) by mouth daily as needed. For allergy, Disp: 90 tablet, Rfl: 3 .  pravastatin (PRAVACHOL) 40 MG tablet, Take 40 mg by mouth at bedtime.  , Disp: , Rfl:  .  prochlorperazine (COMPAZINE) 5 MG tablet, TAKE 1 TABLET BY MOUTH 3 TIMES A DAY FOR VERTIGO OR NAUSEA, Disp: 90 tablet, Rfl: 8 .  tiotropium (SPIRIVA HANDIHALER) 18 MCG inhalation capsule, Place 1 capsule (18 mcg total) into inhaler and inhale daily., Disp: 30 capsule, Rfl: 2 .  Tiotropium Bromide Monohydrate (SPIRIVA RESPIMAT) 2.5 MCG/ACT AERS, Use 1 to 2 puff once daily, Disp: 4 g, Rfl: 99 .  warfarin (COUMADIN) 2 MG tablet, TAKE 1 TO 2 TABLETS BY MOUTH DAILY OR AS DIRECTED, Disp: 180 tablet, Rfl: 3 .  benzonatate (TESSALON PERLES) 100 MG capsule, Take 2 capsules (200 mg total) by mouth 3 (three) times daily as needed for cough (Max: 600mg  per day (6 capsules per day)). (Patient not taking: Reported on 04/16/2014), Disp: 120 capsule, Rfl: 0 .  cyclobenzaprine (FLEXERIL) 10 MG tablet, Take 1/2 to 1 tab TID prn muscle spasm (Patient not taking: Reported on 04/16/2014), Disp: 30 tablet, Rfl: 0 .  fluconazole (DIFLUCAN) 150 MG tablet, Take 1 tablet (150 mg total) by mouth daily. (Patient not taking: Reported on 04/16/2014), Disp: 1 tablet, Rfl: 3 .  terconazole (TERAZOL 7) 0.4 % vaginal cream, Place 1 applicator vaginally at bedtime. (Patient not taking: Reported on 04/16/2014), Disp: 45 g, Rfl: 2  BP 134/87 mmHg  Pulse 85   Resp 16  Ht 5\' 4"  (1.626 m)  Wt 189 lb (85.73 kg)  BMI 32.43 kg/m2  Body mass index is 32.43 kg/(m^2).           Review of Systems has chronic dyspnea on exertion with occasional wheezing due to COPD. Also has history of hypothyroidism. Complains of occasional chest discomfort. History coronary artery disease. Denies lateralizing  weakness, aphasia, amaurosis fugax, diplopia, blurred vision, syncope. Other systems negative and complete review of systems     Objective:   Physical Exam BP 134/87 mmHg  Pulse 85  Resp 16  Ht 5\' 4"  (1.626 m)  Wt 189 lb (85.73 kg)  BMI 32.43 kg/m2  Gen.-alert and oriented x3 in no apparent distress HEENT normal for age Lungs no rhonchi or wheezing Cardiovascular regular rhythm no murmurs carotid pulses 3+ palpable no bruits audible Abdomen soft nontender no palpable masses Musculoskeletal free of  major deformities Skin clear -no rashes Neurologic normal Lower extremities 3+ femoral pulses palpable bilaterally. Both feet well perfused.  That ordered lower extremity ABIs which I reviewed and interpreted. There is triphasic flow in both feet with ABI of 1.1 in the right and 1.06 on the left. Exline also ordered CT angiogram of the abdomen and pelvis which I reviewed by computer. I did look at the images. Patient has no evidence of endoleak in the aneurysm sac continues to contract with the graft and good position. Right external iliac artery is quite small but no evidence of narrowing focally is noted.       Assessment:     Status post aortobiiliac stent graft for abdominal aortic aneurysm with no evidence of endoleak and good contraction of aneurysm around graft Stable claudication right thigh and buttock with small caliber right external iliac artery but good ABIs and triphasic flow distally COPD-stable Coronary artery disease with chronic atrial fib-to see Dr. Crissie Sickles soon-patient on chronic Coumadin    Plan:     Return in one year  with CT angiogram of abdomen and pelvis and repeat ABIs unless symptoms become worse in right leg

## 2014-04-17 ENCOUNTER — Other Ambulatory Visit: Payer: Self-pay | Admitting: *Deleted

## 2014-04-17 DIAGNOSIS — I739 Peripheral vascular disease, unspecified: Secondary | ICD-10-CM

## 2014-04-30 ENCOUNTER — Ambulatory Visit: Payer: Self-pay | Admitting: Physician Assistant

## 2014-05-01 ENCOUNTER — Ambulatory Visit (INDEPENDENT_AMBULATORY_CARE_PROVIDER_SITE_OTHER): Payer: Medicare Other | Admitting: Internal Medicine

## 2014-05-01 ENCOUNTER — Encounter: Payer: Self-pay | Admitting: Internal Medicine

## 2014-05-01 VITALS — BP 126/74 | HR 76 | Temp 98.1°F | Resp 16 | Ht 63.5 in | Wt 198.4 lb

## 2014-05-01 DIAGNOSIS — Z79899 Other long term (current) drug therapy: Secondary | ICD-10-CM | POA: Diagnosis not present

## 2014-05-01 DIAGNOSIS — Z9181 History of falling: Secondary | ICD-10-CM

## 2014-05-01 DIAGNOSIS — I482 Chronic atrial fibrillation, unspecified: Secondary | ICD-10-CM

## 2014-05-01 DIAGNOSIS — I10 Essential (primary) hypertension: Secondary | ICD-10-CM | POA: Diagnosis not present

## 2014-05-01 DIAGNOSIS — Z0000 Encounter for general adult medical examination without abnormal findings: Secondary | ICD-10-CM

## 2014-05-01 DIAGNOSIS — R7303 Prediabetes: Secondary | ICD-10-CM

## 2014-05-01 DIAGNOSIS — R7309 Other abnormal glucose: Secondary | ICD-10-CM | POA: Diagnosis not present

## 2014-05-01 DIAGNOSIS — E782 Mixed hyperlipidemia: Secondary | ICD-10-CM | POA: Diagnosis not present

## 2014-05-01 DIAGNOSIS — Z7901 Long term (current) use of anticoagulants: Secondary | ICD-10-CM | POA: Diagnosis not present

## 2014-05-01 DIAGNOSIS — Z1331 Encounter for screening for depression: Secondary | ICD-10-CM

## 2014-05-01 DIAGNOSIS — E559 Vitamin D deficiency, unspecified: Secondary | ICD-10-CM

## 2014-05-01 LAB — CBC WITH DIFFERENTIAL/PLATELET
BASOS ABS: 0 10*3/uL (ref 0.0–0.1)
Basophils Relative: 0 % (ref 0–1)
Eosinophils Absolute: 0.1 10*3/uL (ref 0.0–0.7)
Eosinophils Relative: 2 % (ref 0–5)
HCT: 43.7 % (ref 36.0–46.0)
Hemoglobin: 14.7 g/dL (ref 12.0–15.0)
Lymphocytes Relative: 28 % (ref 12–46)
Lymphs Abs: 2.1 10*3/uL (ref 0.7–4.0)
MCH: 31.3 pg (ref 26.0–34.0)
MCHC: 33.6 g/dL (ref 30.0–36.0)
MCV: 93 fL (ref 78.0–100.0)
MPV: 11 fL (ref 8.6–12.4)
Monocytes Absolute: 0.7 10*3/uL (ref 0.1–1.0)
Monocytes Relative: 10 % (ref 3–12)
NEUTROS ABS: 4.4 10*3/uL (ref 1.7–7.7)
Neutrophils Relative %: 60 % (ref 43–77)
PLATELETS: 255 10*3/uL (ref 150–400)
RBC: 4.7 MIL/uL (ref 3.87–5.11)
RDW: 15.5 % (ref 11.5–15.5)
WBC: 7.4 10*3/uL (ref 4.0–10.5)

## 2014-05-01 NOTE — Patient Instructions (Signed)
Recommend the book "The END of DIETING" by Dr Excell Seltzer   & the book "The END of DIABETES " by Dr Excell Seltzer  At [email or web address] - get book & Audio CD's      Being diabetic has a  300% increased risk for heart attack, stroke, cancer, and alzheimer- type vascular dementia. It is very important that you work harder with diet by avoiding all foods that are white. Avoid white rice (brown & wild rice is OK), white potatoes (sweetpotatoes in moderation is OK), White bread or wheat bread or anything made out of white flour like bagels, donuts, rolls, buns, biscuits, cakes, pastries, cookies, pizza crust, and pasta (made from white flour & egg whites) - vegetarian pasta or spinach or wheat pasta is OK. Multigrain breads like Arnold's or [employer], or multigrain sandwich thins or flatbreads.  Diet, exercise and weight loss can reverse and cure diabetes in the early stages.  Diet, exercise and weight loss is very important in the control and prevention of complications of diabetes which affects every system in your body, ie. Brain - dementia/stroke, eyes - glaucoma/blindness, heart - heart attack/heart failure, kidneys - dialysis, stomach - gastric paralysis, intestines - malabsorption, nerves - severe painful neuritis, circulation - gangrene & loss of a leg(s), and finally cancer and Alzheimers.    I recommend avoid fried & greasy foods,  sweets/candy, white rice (brown or wild rice or Quinoa is OK), white potatoes (sweet potatoes are OK) - anything made from white flour - bagels, doughnuts, rolls, buns, biscuits,white and wheat breads, pizza crust and traditional pasta made of white flour & egg white(vegetarian pasta or spinach or wheat pasta is OK).  Multi-grain bread is OK - like multi-grain flat bread or sandwich thins. Avoid alcohol in excess. Exercise is also important.    Eat all the vegetables you want - avoid meat, especially red meat and dairy - especially cheese.  Cheese is the most  concentrated form of trans-fats which is the worst thing to clog up our arteries. Veggie cheese is OK which can be found in the fresh produce section at [employer] or [employer] or [employer]  Preventive Care for Adults A healthy lifestyle and preventive care can promote health and wellness. Preventive health guidelines for men include the following key practices:  A routine yearly physical is a good way to check with your health care provider about your health and preventative screening. It is a chance to share any concerns and updates on your health and to receive a thorough exam.  Visit your dentist for a routine exam and preventative care every 6 months. Brush your teeth twice a day and floss once a day. Good oral hygiene prevents tooth decay and gum disease.  The frequency of eye exams is based on your age, health, family medical history, use of contact lenses, and other factors. Follow your health care provider's recommendations for frequency of eye exams.  Eat a healthy diet. Foods such as vegetables, fruits, whole grains, low-fat dairy products, and lean protein foods contain the nutrients you need without too many calories. Decrease your intake of foods high in solid fats, added sugars, and salt. Eat the right amount of calories for you.Get information about a proper diet from your health care provider, if necessary.  Regular physical exercise is one of the most important things you can do for your health. Most adults should get at least 150 minutes of moderate-intensity exercise (any activity that increases your heart rate  and causes you to sweat) each week. In addition, most adults need muscle-strengthening exercises on 2 or more days a week.  Maintain a healthy weight. The body mass index (BMI) is a screening tool to identify possible weight problems. It provides an estimate of body fat based on height and weight. Your health care provider can find your BMI and can help you achieve or  maintain a healthy weight.For adults 20 years and older:  A BMI below 18.5 is considered underweight.  A BMI of 18.5 to 24.9 is normal.  A BMI of 25 to 29.9 is considered overweight.  A BMI of 30 and above is considered obese.  Maintain normal blood lipids and cholesterol levels by exercising and minimizing your intake of saturated fat. Eat a balanced diet with plenty of fruit and vegetables. Blood tests for lipids and cholesterol should begin at age 74 and be repeated every 5 years. If your lipid or cholesterol levels are high, you are over 50, or you are at high risk for heart disease, you may need your cholesterol levels checked more frequently.Ongoing high lipid and cholesterol levels should be treated with medicines if diet and exercise are not working.  If you smoke, find out from your health care provider how to quit. If you do not use tobacco, do not start.  Lung cancer screening is recommended for adults aged 66-80 years who are at high risk for developing lung cancer because of a history of smoking. A yearly low-dose CT scan of the lungs is recommended for people who have at least a 30-pack-year history of smoking and are a current smoker or have quit within the past 15 years. A pack year of smoking is smoking an average of 1 pack of cigarettes a day for 1 year (for example: 1 pack a day for 30 years or 2 packs a day for 15 years). Yearly screening should continue until the smoker has stopped smoking for at least 15 years. Yearly screening should be stopped for people who develop a health problem that would prevent them from having lung cancer treatment.  If you choose to drink alcohol, do not have more than 2 drinks per day. One drink is considered to be 12 ounces (355 mL) of beer, 5 ounces (148 mL) of wine, or 1.5 ounces (44 mL) of liquor.  Avoid use of street drugs. Do not share needles with anyone. Ask for help if you need support or instructions about stopping the use of  drugs.  High blood pressure causes heart disease and increases the risk of stroke. Your blood pressure should be checked at least every 1-2 years. Ongoing high blood pressure should be treated with medicines, if weight loss and exercise are not effective.  If you are 62-43 years old, ask your health care provider if you should take aspirin to prevent heart disease.  Diabetes screening involves taking a blood sample to check your fasting blood sugar level. This should be done once every 3 years, after age 52, if you are within normal weight and without risk factors for diabetes. Testing should be considered at a younger age or be carried out more frequently if you are overweight and have at least 1 risk factor for diabetes.  Colorectal cancer can be detected and often prevented. Most routine colorectal cancer screening begins at the age of 52 and continues through age 104. However, your health care provider may recommend screening at an earlier age if you have risk factors for colon cancer.  On a yearly basis, your health care provider may provide home test kits to check for hidden blood in the stool. Use of a small camera at the end of a tube to directly examine the colon (sigmoidoscopy or colonoscopy) can detect the earliest forms of colorectal cancer. Talk to your health care provider about this at age 9, when routine screening begins. Direct exam of the colon should be repeated every 5-10 years through age 9, unless early forms of precancerous polyps or small growths are found.  People who are at an increased risk for hepatitis B should be screened for this virus. You are considered at high risk for hepatitis B if:  You were born in a country where hepatitis B occurs often. Talk with your health care provider about which countries are considered high risk.  Your parents were born in a high-risk country and you have not received a shot to protect against hepatitis B (hepatitis B vaccine).  You have  HIV or AIDS.  You use needles to inject street drugs.  You live with, or have sex with, someone who has hepatitis B.  You are a man who has sex with other men (MSM).  You get hemodialysis treatment.  You take certain medicines for conditions such as cancer, organ transplantation, and autoimmune conditions.  Hepatitis C blood testing is recommended for all people born from [birth year] through 1965 and any individual with known risks for hepatitis C.  Practice safe sex. Use condoms and avoid high-risk sexual practices to reduce the spread of sexually transmitted infections (STIs). STIs include gonorrhea, chlamydia, syphilis, trichomonas, herpes, HPV, and human immunodeficiency virus (HIV). Herpes, HIV, and HPV are viral illnesses that have no cure. They can result in disability, cancer, and death.  If you are at risk of being infected with HIV, it is recommended that you take a prescription medicine daily to prevent HIV infection. This is called preexposure prophylaxis (PrEP). You are considered at risk if:  You are a man who has sex with other men (MSM) and have other risk factors.  You are a heterosexual man, are sexually active, and are at increased risk for HIV infection.  You take drugs by injection.  You are sexually active with a partner who has HIV.  Talk with your health care provider about whether you are at high risk of being infected with HIV. If you choose to begin PrEP, you should first be tested for HIV. You should then be tested every 3 months for as long as you are taking PrEP.  A one-time screening for abdominal aortic aneurysm (AAA) and surgical repair of large AAAs by ultrasound are recommended for men ages 39 to 63 years who are current or former smokers.  Healthy men should no longer receive prostate-specific antigen (PSA) blood tests as part of routine cancer screening. Talk with your health care provider about prostate cancer screening.  Testicular cancer screening is  not recommended for adult males who have no symptoms. Screening includes self-exam, a health care provider exam, and other screening tests. Consult with your health care provider about any symptoms you have or any concerns you have about testicular cancer.  Use sunscreen. Apply sunscreen liberally and repeatedly throughout the day. You should seek shade when your shadow is shorter than you. Protect yourself by wearing long sleeves, pants, a wide-brimmed hat, and sunglasses year round, whenever you are outdoors.  Once a month, do a whole-body skin exam, using a mirror to look at the skin on  your back. Tell your health care provider about new moles, moles that have irregular borders, moles that are larger than a pencil eraser, or moles that have changed in shape or color.  Stay current with required vaccines (immunizations).  Influenza vaccine. All adults should be immunized every year.  Tetanus, diphtheria, and acellular pertussis (Td, Tdap) vaccine. An adult who has not previously received Tdap or who does not know his vaccine status should receive 1 dose of Tdap. This initial dose should be followed by tetanus and diphtheria toxoids (Td) booster doses every 10 years. Adults with an unknown or incomplete history of completing a 3-dose immunization series with Td-containing vaccines should begin or complete a primary immunization series including a Tdap dose. Adults should receive a Td booster every 10 years.  Varicella vaccine. An adult without evidence of immunity to varicella should receive 2 doses or a second dose if he has previously received 1 dose.  Human papillomavirus (HPV) vaccine. Males aged 8-21 years who have not received the vaccine previously should receive the 3-dose series. Males aged 22-26 years may be immunized. Immunization is recommended through the age of 49 years for any female who has sex with males and did not get any or all doses earlier. Immunization is recommended for any  person with an immunocompromised condition through the age of 14 years if he did not get any or all doses earlier. During the 3-dose series, the second dose should be obtained 4-8 weeks after the first dose. The third dose should be obtained 24 weeks after the first dose and 16 weeks after the second dose.  Zoster vaccine. One dose is recommended for adults aged 62 years or older unless certain conditions are present.  Measles, mumps, and rubella (MMR) vaccine. Adults born before 45 generally are considered immune to measles and mumps. Adults born in [birth year] or later should have 1 or more doses of MMR vaccine unless there is a contraindication to the vaccine or there is laboratory evidence of immunity to each of the three diseases. A routine second dose of MMR vaccine should be obtained at least 28 days after the first dose for students attending postsecondary schools, health care workers, or international travelers. People who received inactivated measles vaccine or an unknown type of measles vaccine during 1963-1967 should receive 2 doses of MMR vaccine. People who received inactivated mumps vaccine or an unknown type of mumps vaccine before 1979 and are at high risk for mumps infection should consider immunization with 2 doses of MMR vaccine. Unvaccinated health care workers born before [birth year] who lack laboratory evidence of measles, mumps, or rubella immunity or laboratory confirmation of disease should consider measles and mumps immunization with 2 doses of MMR vaccine or rubella immunization with 1 dose of MMR vaccine.  Pneumococcal 13-valent conjugate (PCV13) vaccine. When indicated, a person who is uncertain of his immunization history and has no record of immunization should receive the PCV13 vaccine. An adult aged 2 years or older who has certain medical conditions and has not been previously immunized should receive 1 dose of PCV13 vaccine. This PCV13 should be followed with a dose of pneumococcal  polysaccharide (PPSV23) vaccine. The PPSV23 vaccine dose should be obtained at least 8 weeks after the dose of PCV13 vaccine. An adult aged 57 years or older who has certain medical conditions and previously received 1 or more doses of PPSV23 vaccine should receive 1 dose of PCV13. The PCV13 vaccine dose should be obtained 1 or more years after  the last PPSV23 vaccine dose.  Pneumococcal polysaccharide (PPSV23) vaccine. When PCV13 is also indicated, PCV13 should be obtained first. All adults aged 76 years and older should be immunized. An adult younger than age 18 years who has certain medical conditions should be immunized. Any person who resides in a nursing home or long-term care facility should be immunized. An adult smoker should be immunized. People with an immunocompromised condition and certain other conditions should receive both PCV13 and PPSV23 vaccines. People with human immunodeficiency virus (HIV) infection should be immunized as soon as possible after diagnosis. Immunization during chemotherapy or radiation therapy should be avoided. Routine use of PPSV23 vaccine is not recommended for American Indians, [place] Natives, or people younger than 65 years unless there are medical conditions that require PPSV23 vaccine. When indicated, people who have unknown immunization and have no record of immunization should receive PPSV23 vaccine. One-time revaccination 5 years after the first dose of PPSV23 is recommended for people aged 19-64 years who have chronic kidney failure, nephrotic syndrome, asplenia, or immunocompromised conditions. People who received 1-2 doses of PPSV23 before age 87 years should receive another dose of PPSV23 vaccine at age 51 years or later if at least 5 years have passed since the previous dose. Doses of PPSV23 are not needed for people immunized with PPSV23 at or after age 84 years.  Meningococcal vaccine. Adults with asplenia or persistent complement component deficiencies  should receive 2 doses of quadrivalent meningococcal conjugate (MenACWY-D) vaccine. The doses should be obtained at least 2 months apart. Microbiologists working with certain meningococcal bacteria, [place] recruits, people at risk during an outbreak, and people who travel to or live in countries with a high rate of meningitis should be immunized. A first-year college student up through age 42 years who is living in a residence hall should receive a dose if he did not receive a dose on or after his 16th birthday. Adults who have certain high-risk conditions should receive one or more doses of vaccine.  Hepatitis A vaccine. Adults who wish to be protected from this disease, have certain high-risk conditions, work with hepatitis A-infected animals, work in hepatitis A research labs, or travel to or work in countries with a high rate of hepatitis A should be immunized. Adults who were previously unvaccinated and who anticipate close contact with an international adoptee during the first 60 days after arrival in the [place] from a country with a high rate of hepatitis A should be immunized.  Hepatitis B vaccine. Adults should be immunized if they wish to be protected from this disease, have certain high-risk conditions, may be exposed to blood or other infectious body fluids, are household contacts or sex partners of hepatitis B positive people, are clients or workers in certain care facilities, or travel to or work in countries with a high rate of hepatitis B.  Haemophilus influenzae type b (Hib) vaccine. A previously unvaccinated person with asplenia or sickle cell disease or having a scheduled splenectomy should receive 1 dose of Hib vaccine. Regardless of previous immunization, a recipient of a hematopoietic stem cell transplant should receive a 3-dose series 6-12 months after his successful transplant. Hib vaccine is not recommended for adults with HIV infection. Preventive Service / Frequency Ages  3 to 44  Blood pressure check.** / Every 1 to 2 years.  Lipid and cholesterol check.** / Every 5 years beginning at age 41.  Hepatitis C blood test.** / For any individual with known risks for hepatitis C.  Skin self-exam. / Monthly.  Influenza vaccine. / Every year.  Tetanus, diphtheria, and acellular pertussis (Tdap, Td) vaccine.** / Consult your health care provider. 1 dose of Td every 10 years.  Varicella vaccine.** / Consult your health care provider.  HPV vaccine. / 3 doses over 6 months, if 12 or younger.  Measles, mumps, rubella (MMR) vaccine.** / You need at least 1 dose of MMR if you were born in 1957 or later. You may also need a second dose.  Pneumococcal 13-valent conjugate (PCV13) vaccine.** / Consult your health care provider.  Pneumococcal polysaccharide (PPSV23) vaccine.** / 1 to 2 doses if you smoke cigarettes or if you have certain conditions.  Meningococcal vaccine.** / 1 dose if you are age 62 to 68 years and a Market researcher living in a residence hall, or have one of several medical conditions. You may also need additional booster doses.  Hepatitis A vaccine.** / Consult your health care provider.  Hepatitis B vaccine.** / Consult your health care provider.  Haemophilus influenzae type b (Hib) vaccine.** / Consult your health care provider. Ages 47 to 58  Blood pressure check.** / Every 1 to 2 years.  Lipid and cholesterol check.** / Every 5 years beginning at age 66.  Lung cancer screening. / Every year if you are aged 75-80 years and have a 30-pack-year history of smoking and currently smoke or have quit within the past 15 years. Yearly screening is stopped once you have quit smoking for at least 15 years or develop a health problem that would prevent you from having lung cancer treatment.  Fecal occult blood test (FOBT) of stool. / Every year beginning at age 24 and continuing until age 5. You may not have to do this test if you get a  colonoscopy every 10 years.  Flexible sigmoidoscopy** or colonoscopy.** / Every 5 years for a flexible sigmoidoscopy or every 10 years for a colonoscopy beginning at age 69 and continuing until age 16.  Hepatitis C blood test.** / For all people born from [birth year] through 1965 and any individual with known risks for hepatitis C.  Skin self-exam. / Monthly.  Influenza vaccine. / Every year.  Tetanus, diphtheria, and acellular pertussis (Tdap/Td) vaccine.** / Consult your health care provider. 1 dose of Td every 10 years.  Varicella vaccine.** / Consult your health care provider.  Zoster vaccine.** / 1 dose for adults aged 15 years or older.  Measles, mumps, rubella (MMR) vaccine.** / You need at least 1 dose of MMR if you were born in 1956 or later. You may also need a second dose.  Pneumococcal 13-valent conjugate (PCV13) vaccine.** / Consult your health care provider.  Pneumococcal polysaccharide (PPSV23) vaccine.** / 1 to 2 doses if you smoke cigarettes or if you have certain conditions.  Meningococcal vaccine.** / Consult your health care provider.  Hepatitis A vaccine.** / Consult your health care provider.  Hepatitis B vaccine.** / Consult your health care provider.  Haemophilus influenzae type b (Hib) vaccine.** / Consult your health care provider. Ages 52 and over  Blood pressure check.** / Every 1 to 2 years.  Lipid and cholesterol check.**/ Every 5 years beginning at age 5.  Lung cancer screening. / Every year if you are aged 44-80 years and have a 30-pack-year history of smoking and currently smoke or have quit within the past 15 years. Yearly screening is stopped once you have quit smoking for at least 15 years or develop a health problem that would prevent you  from having lung cancer treatment.  Fecal occult blood test (FOBT) of stool. / Every year beginning at age 35 and continuing until age 40. You may not have to do this test if you get a colonoscopy every 10  years.  Flexible sigmoidoscopy** or colonoscopy.** / Every 5 years for a flexible sigmoidoscopy or every 10 years for a colonoscopy beginning at age 23 and continuing until age 26.  Hepatitis C blood test.** / For all people born from [birth year] through 1964 and any individual with known risks for hepatitis C.  Abdominal aortic aneurysm (AAA) screening./ Screening current or former smokers or have Hypertension.  Skin self-exam. / Monthly.  Influenza vaccine. / Every year.  Tetanus, diphtheria, and acellular pertussis (Tdap/Td) vaccine.** / 1 dose of Td every 10 years.  Varicella vaccine.** / Consult your health care provider.  Zoster vaccine.** / 1 dose for adults aged 23 years or older.  Pneumococcal 13-valent conjugate (PCV13) vaccine.** / Consult your health care provider.  Pneumococcal polysaccharide (PPSV23) vaccine.** / 1 dose for all adults aged 5 years and older.  Meningococcal vaccine.** / Consult your health care provider.  Hepatitis A vaccine.** / Consult your health care provider.  Hepatitis B vaccine.** / Consult your health care provider.  Haemophilus influenzae type b (Hib) vaccine.** / Consult your health care provider.  Health Maintenance A healthy lifestyle and preventative care can promote health and wellness. Maintain regular health, dental, and eye exams. Eat a healthy diet. Foods like vegetables, fruits, whole grains, low-fat dairy products, and lean protein foods contain the nutrients you need and are low in calories. Decrease your intake of foods high in solid fats, added sugars, and salt. Get information about a proper diet from your health care provider, if necessary. Regular physical exercise is one of the most important things you can do for your health. Most adults should get at least 150 minutes of moderate-intensity exercise (any activity that increases your heart rate and causes you to sweat) each week. In addition, most adults need muscle-strengthening  exercises on 2 or more days a week.  Maintain a healthy weight. The body mass index (BMI) is a screening tool to identify possible weight problems. It provides an estimate of body fat based on height and weight. Your health care provider can find your BMI and can help you achieve or maintain a healthy weight. For males 20 years and older: A BMI below 18.5 is considered underweight. A BMI of 18.5 to 24.9 is normal. A BMI of 25 to 29.9 is considered overweight. A BMI of 30 and above is considered obese. Maintain normal blood lipids and cholesterol by exercising and minimizing your intake of saturated fat. Eat a balanced diet with plenty of fruits and vegetables. Blood tests for lipids and cholesterol should begin at age 24 and be repeated every 5 years. If your lipid or cholesterol levels are high, you are over age 70, or you are at high risk for heart disease, you may need your cholesterol levels checked more frequently.Ongoing high lipid and cholesterol levels should be treated with medicines if diet and exercise are not working. If you smoke, find out from your health care provider how to quit. If you do not use tobacco, do not start. Lung cancer screening is recommended for adults aged 70-80 years who are at high risk for developing lung cancer because of a history of smoking. A yearly low-dose CT scan of the lungs is recommended for people who have at least a 30-pack-year  history of smoking and are current smokers or have quit within the past 15 years. A pack year of smoking is smoking an average of 1 pack of cigarettes a day for 1 year (for example, a 30-pack-year history of smoking could mean smoking 1 pack a day for 30 years or 2 packs a day for 15 years). Yearly screening should continue until the smoker has stopped smoking for at least 15 years. Yearly screening should be stopped for people who develop a health problem that would prevent them from having lung cancer treatment. If you choose to  drink alcohol, do not have more than 2 drinks per day. One drink is considered to be 12 oz (360 mL) of beer, 5 oz (150 mL) of wine, or 1.5 oz (45 mL) of liquor. Avoid the use of street drugs. Do not share needles with anyone. Ask for help if you need support or instructions about stopping the use of drugs. High blood pressure causes heart disease and increases the risk of stroke. Blood pressure should be checked at least every 1-2 years. Ongoing high blood pressure should be treated with medicines if weight loss and exercise are not effective. If you are 18-79 years old, ask your health care provider if you should take aspirin to prevent heart disease. Diabetes screening involves taking a blood sample to check your fasting blood sugar level. This should be done once every 3 years after age 16 if you are at a normal weight and without risk factors for diabetes. Testing should be considered at a younger age or be carried out more frequently if you are overweight and have at least 1 risk factor for diabetes. Colorectal cancer can be detected and often prevented. Most routine colorectal cancer screening begins at the age of 13 and continues through age 30. However, your health care provider may recommend screening at an earlier age if you have risk factors for colon cancer. On a yearly basis, your health care provider may provide home test kits to check for hidden blood in the stool. A small camera at the end of a tube may be used to directly examine the colon (sigmoidoscopy or colonoscopy) to detect the earliest forms of colorectal cancer. Talk to your health care provider about this at age 1 when routine screening begins. A direct exam of the colon should be repeated every 5-10 years through age 52, unless early forms of precancerous polyps or small growths are found. People who are at an increased risk for hepatitis B should be screened for this virus. You are considered at high risk for hepatitis B if: You  were born in a country where hepatitis B occurs often. Talk with your health care provider about which countries are considered high risk. Your parents were born in a high-risk country and you have not received a shot to protect against hepatitis B (hepatitis B vaccine). You have HIV or AIDS. You use needles to inject street drugs. You live with, or have sex with, someone who has hepatitis B. You are a man who has sex with other men (MSM). You get hemodialysis treatment. You take certain medicines for conditions like cancer, organ transplantation, and autoimmune conditions. Hepatitis C blood testing is recommended for all people born from [birth year] through 1964 and any individual with known risk factors for hepatitis C. Healthy men should no longer receive prostate-specific antigen (PSA) blood tests as part of routine cancer screening. Talk to your health care provider about prostate cancer screening. Testicular cancer  screening is not recommended for adolescents or adult males who have no symptoms. Screening includes self-exam, a health care provider exam, and other screening tests. Consult with your health care provider about any symptoms you have or any concerns you have about testicular cancer. Practice safe sex. Use condoms and avoid high-risk sexual practices to reduce the spread of sexually transmitted infections (STIs). You should be screened for STIs, including gonorrhea and chlamydia if: You are sexually active and are younger than 24 years. You are older than 24 years, and your health care provider tells you that you are at risk for this type of infection. Your sexual activity has changed since you were last screened, and you are at an increased risk for chlamydia or gonorrhea. Ask your health care provider if you are at risk. If you are at risk of being infected with HIV, it is recommended that you take a prescription medicine daily to prevent HIV infection. This is called pre-exposure  prophylaxis (PrEP). You are considered at risk if: You are a man who has sex with other men (MSM). You are a heterosexual man who is sexually active with multiple partners. You take drugs by injection. You are sexually active with a partner who has HIV. Talk with your health care provider about whether you are at high risk of being infected with HIV. If you choose to begin PrEP, you should first be tested for HIV. You should then be tested every 3 months for as long as you are taking PrEP. Use sunscreen. Apply sunscreen liberally and repeatedly throughout the day. You should seek shade when your shadow is shorter than you. Protect yourself by wearing long sleeves, pants, a wide-brimmed hat, and sunglasses year round whenever you are outdoors. Tell your health care provider of new moles or changes in moles, especially if there is a change in shape or color. Also, tell your health care provider if a mole is larger than the size of a pencil eraser. Stay current with your vaccines (immunizations).

## 2014-05-01 NOTE — Progress Notes (Addendum)
Patient ID: Katie [name], female   DOB: [birth year], 74 y.o.   MRN: [id number]  [facility] VISIT AND CPE  Assessment:   1. Essential hypertension (1999)  - TSH  2. Chronic atrial fibrillation / NonIschemic Cardiomyocardiopathy (2005)  - Stable on coumadin x 11 years  3. Hyperlipidemia  - Lipid panel  4. Morbid Obesity / Prediabetes  - Hemoglobin A1c - Insulin, fasting  5. Vitamin D deficiency  - Vit D  25 hydroxy (rtn osteoporosis monitoring)  6. Encounter for long-term (current) use of medications  - Protime-INR  7. Medication management  - CBC with Differential/Platelet - BASIC METABOLIC PANEL WITH GFR - Hepatic function panel - Magnesium - Digoxin level  8. Routine general medical examination at a health care facility   9. Depression screen  - Negative screen  10. At low risk for fall   Plan:   During the course of the visit the patient was educated and counseled about appropriate screening and preventive services including:    Pneumococcal vaccine   Influenza vaccine  Td vaccine  Screening electrocardiogram  Bone densitometry screening  Colorectal cancer screening  Diabetes screening  Glaucoma screening  Nutrition counseling   Advanced directives: requested  Screening recommendations, referrals: Vaccinations: Immunization History  Administered Date(s) Administered  . Influenza, High Dose Seasonal PF 12/20/2013  . Influenza-Unspecified 01/02/2013  . Pneumococcal Conjugate-13 01/23/2014  . Pneumococcal-Unspecified 03/15/1993, 05/31/2008  . Td 03/16/2007  . Varicella 02/19/2008   Hep B vaccine not indicated  Nutrition assessed and recommended  Colonoscopy 07/14/2002 & Patient refuses to ever have a colonoscope again Recommended yearly ophthalmology/optometry visit for glaucoma screening and checkup Recommended yearly dental visit for hygiene and checkup Advanced directives - no - offered frms  Conditions/risks  identified: BMI: Discussed weight loss, diet, and increase physical activity.  Increase physical activity: AHA recommends 150 minutes of physical activity a week.  Medications reviewed Patient has Morbid Obesity (BMI 35.5) and consequent PreDiabetes which  is near goal, ACE/ARB therapy: Yes. Urinary Incontinence is not an issue: discussed non pharmacology and pharmacology options.  Fall risk: low- discussed PT, home fall assessment, medications.   Subjective:   Katie [name] is a 74 y.o. MWF who presents for Medicare Annual Wellness Visit and OV for coag. Monitoring.  Date of last medicare wellness visit is unknown.  She has had elevated blood pressure since 1999. Her blood pressure has been controlled at home, & today their BP is BP: 126/74 mmHg She does not workout. She has Afib predating since 2005 when ht cath revealed a non-ischemic cardiomyopathy. In July 2014, a Jodie Echevaria showed an EF 50%. She denies any exertional chest pain, shortness of breath, dizziness.  She is on cholesterol medication and denies myalgias. The cholesterol last visit was at goal with elevated Trig as below.   Lab Results  Component Value Date   CHOL 159 11/05/2013   HDL 37* 11/05/2013   LDLCALC 88 11/05/2013   TRIG 171* 11/05/2013   CHOLHDL 4.3 11/05/2013   She has had prediabetes for 2 years (Dec 2013 with A1c 5.7% ). She has been working on diet and exercise for prediabetes, and denies foot ulcerations, hyperglycemia, paresthesia of the feet, polydipsia, polyuria and visual disturbances. Last A1C in the office was:  Lab Results  Component Value Date   HGBA1C 5.9* 11/05/2013   Patient is on Vitamin D supplement.   Lab Results  Component Value Date   VD25OH 44 11/05/2013     Names of  Other Physician/Practitioners you currently use: 1. [facility] here for primary care 2. Dr Patrice Paradise, OD with Dr Katy Fitch, eye doctor, last visit Jan 2016 3. No dentist, has  dentures  Patient Care Team: [name], MD as PCP - General (Internal Medicine) Evans Lance, MD as Consulting Physician (Cardiology) Wonda Amis, MD as Consulting Physician (Ophthalmology) Terrance Mass, MD as Consulting Physician (Gynecology)  Medication Review: Medication Sig  . albuterol (PROVENTIL HFA;VENTOLIN HFA) 108 (90 BASE) MCG/ACT inhaler 2 puffs 5 minutes apart every 4 to 6 hours as needed to rescue asthma  . allopurinol (ZYLOPRIM) 300 MG tablet TAKE 1 TABLET BY MOUTH DAILY  . aspirin 81 MG tablet Take 81 mg by mouth daily.    . benazepril (LOTENSIN) 20 MG tablet TAKE 1 TABLET BY MOUTH DAILY AS NEEDED FOR BLOOD PRESSURE AND HEART  . benzonatate (TESSALON PERLES) 100 MG capsule Take 2 capsules (200 mg total) by mouth 3 (three) times daily as needed for cough (Max: 600mg  per day (6 capsules per day)). (Patient not taking: Reported on 04/16/2014)  . bisoprolol-hydrochlorothiazide (ZIAC) 5-6.25 MG per tablet TAKE 1 TABLET BY MOUTH DAILY AS NEEDED FOR BLOOD PRESSURE AND HEART  . cyclobenzaprine (FLEXERIL) 10 MG tablet Take 1/2 to 1 tab TID prn muscle spasm (Patient not taking: Reported on 04/16/2014)  . dexamethasone (DECADRON) 0.1 % ophthalmic suspension Place 1 drop into both eyes daily.  . digoxin (LANOXIN) 0.25 MG tablet TAKE 1 TABLET BY MOUTH DAILY  . fluconazole (DIFLUCAN) 150 MG tablet Take 1 tablet (150 mg total) by mouth daily. (Patient not taking: Reported on 04/16/2014)  . furosemide (LASIX) 80 MG tablet TAKE 1 TABLET BY MOUTH TWICE A DAY AS NEEDED FOR FLUID  . gabapentin (NEURONTIN) 300 MG capsule Take 1 capsule (300 mg total) by mouth 3 (three) times daily.  Marland Kitchen ipratropium (ATROVENT) 0.02 % nebulizer solution   . ipratropium-albuterol (DUONEB) 0.5-2.5 (3) MG/3ML SOLN Use 3 cc  4 x day or every 4 hours if needed for shortness of breath.  . levothyroxine (SYNTHROID, LEVOTHROID) 50 MCG tablet TAKE 1 TABLET EVERY DAY  . montelukast (SINGULAIR) 10 MG tablet Take  1 tablet (10 mg total) by mouth daily as needed. For allergy  . pravastatin (PRAVACHOL) 40 MG tablet Take 40 mg by mouth at bedtime.    . prochlorperazine (COMPAZINE) 5 MG tablet TAKE 1 TABLET BY MOUTH 3 TIMES A DAY FOR VERTIGO OR NAUSEA  . terconazole (TERAZOL 7) 0.4 % vaginal cream Place 1 applicator vaginally at bedtime. (Patient not taking: Reported on 04/16/2014)  . tiotropium (SPIRIVA HANDIHALER) 18 MCG inhalation capsule Place 1 capsule (18 mcg total) into inhaler and inhale daily.  . Tiotropium Bromide Monohydrate (SPIRIVA RESPIMAT) 2.5 MCG/ACT AERS Use 1 to 2 puff once daily  . warfarin (COUMADIN) 2 MG tablet TAKE 1 TO 2 TABLETS BY MOUTH DAILY OR AS DIRECTED   Current Problems (verified) Patient Active Problem List   Diagnosis Date Noted  . Essential hypertension 11/22/2008    Priority: High  . PAD (peripheral artery disease) 04/16/2014  . Encounter for monitoring digoxin therapy 04/02/2014  . Encounter for long-term (current) use of medications 03/11/2014  . PVD (peripheral vascular disease) with claudication 10/16/2013  . CKD  stage III (GFR 51 ml/min) 06/08/2013  . Hyperlipidemia 06/08/2013  . Medication management 06/08/2013  . 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 Long term current use of anticoagulant therapy 04/23/2013  . AAA (abdominal aortic aneurysm) 04/11/2013  .  Vitamin D deficiency 02/15/2013  . Prediabetes 02/15/2013  . Cystocele 08/09/2012  . Cervical dysplasia   . Hypothyroidism   . Osteopenia   . Congestive heart failure, Hx/o 11/25/2008  . CAROTID BRUIT 11/25/2008  . MIGRAINE HEADACHE 11/22/2008  . 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 Atrial fibrillation 11/22/2008  . COPD 11/22/2008  . GERD 11/22/2008  . BELL'S PALSY, HX OF 11/22/2008   Screening Tests Health Maintenance  Topic Date Due  . INFLUENZA VACCINE  10/14/2014  . MAMMOGRAM  08/28/2015  . TETANUS/TDAP  03/15/2017  . COLONOSCOPY  03/15/2022  . DEXA SCAN  Completed  . PNEUMOCOCCAL  POLYSACCHARIDE VACCINE AGE 47 AND OVER  Completed  . ZOSTAVAX  Completed   Immunization History  Administered Date(s) Administered  . Influenza, High Dose Seasonal PF 12/20/2013  . Influenza-Unspecified 01/02/2013  . Pneumococcal Conjugate-13 01/23/2014  . Pneumococcal-Unspecified 03/15/1993, 05/31/2008  . Td 03/16/2007  . Varicella 02/19/2008   Preventative care: Last colonoscopy: 07/14/2002 & refuses f/u.   History reviewed: allergies, current medications, past family history, past medical history, past social history, past surgical history and problem list  Risk Factors: Tobacco History  Substance Use Topics  . Smoking status: Former Smoker    Types: Cigarettes    Quit date: 08/10/2002  . Smokeless tobacco: Never Used     Comment: History of tobacco abuse  . Alcohol Use: 0.0 oz/week     Comment: rare   She does not smoke.  Patient is a former smoker. Are there smokers in your home (other than you)?  No Alcohol Current alcohol use: rarely  Caffeine Current caffeine use: coffee 2-3 cups /day  Exercise Current exercise: housecleaning, walking and yard work  Nutrition/Diet Current diet: in general, a "healthy" diet    Cardiac risk factors: advanced age (older than 98 for men, 69 for women), dyslipidemia, family history of premature cardiovascular disease, hypertension, sedentary lifestyle and smoking/ tobacco exposure.  Depression Screen (Note: if answer to either of the following is "Yes", a more complete depression screening is indicated)   Q1: Over the past two weeks, have you felt down, depressed or hopeless? No  Q2: Over the past two weeks, have you felt little interest or pleasure in doing things? No  Have you lost interest or pleasure in daily life? No  Do you often feel hopeless? No  Do you cry easily over simple problems? No  Activities of Daily Living In your present state of health, do you have any difficulty performing the following activities?:   Driving? No Managing money?  No Feeding yourself? No Getting from bed to chair? No Climbing a flight of stairs? No Preparing food and eating?: No Bathing or showering? No Getting dressed: No Getting to the toilet? No Using the toilet:No Moving around from place to place: No In the past year have you fallen or had a near fall?:No   Are you sexually active?  Yes  Do you have more than one partner?  No  Vision Difficulties: No  Hearing Difficulties: No Do you often ask people to speak up or repeat themselves? No Do you experience ringing or noises in your ears? No Do you have difficulty understanding soft or whispered voices? Sometimes.  Cognition  Do you feel that you have a problem with memory?No  Do you often misplace items? No  Do you feel safe at home?  Yes  Advanced directives Does patient have a [place]? No - offered forms Does patient have a Living Will?  No- offered forms  Past Medical History  Diagnosis Date  . Atrial fibrillation   . COPD (chronic obstructive pulmonary disease)   . Bronchitis, chronic   . History of Bell's palsy   . Hypertension   . Coronary artery disease   . Migraine headache   . Atrial flutter   . Tobacco abuse   . Osteopenia   . Cervical dysplasia   . Thyroid disease     Hypo  . Elevated cholesterol   . Unspecified vitamin D deficiency   . Family history of anesthesia complication     Hard for daughter to wake up after Anesthesia  . Shortness of breath     with exertion  . Hypothyroidism   . Cystocele     either on bladder or kidneys patient is unsure but stated physician will watch it  . Seasonal allergies   . Dizziness     If patient gets out of bed too fast  . H/O hiatal hernia   . Rosacea conjunctivitis     Right eye is worse than left eye  . Arthritis   . Gout   . Aortic stenosis     mild AS witm mild to mod AR by 11/2012 echo   Past Surgical History  Procedure Laterality Date  . Broken leg  Left 1970s  . Laser vein procedure    . Refractive surgery    . Colonoscopy    . Cholecystectomy  1972  . Tonsillectomy    . Eye surgery Bilateral   . Cardiac catheterization    . Abdominal aortic endovascular stent graft N/A 04/11/2013    Procedure: ABDOMINAL AORTIC ENDOVASCULAR STENT GRAFT WITH RIGHT FEMORAL PATCH ANGIOPLASTY;  Surgeon: Mal Misty, MD;  Location: [facility] OR;  Service: Vascular;  Laterality: N/A;  . Breast surgery      LEFT BREAST BIOPSY    ROS: Constitutional: Denies fever, chills, weight loss/gain, headaches, insomnia, fatigue, night sweats, and change in appetite. Eyes: Denies redness, blurred vision, diplopia, discharge, itchy, watery eyes.  ENT: Denies discharge, congestion, post nasal drip, epistaxis, sore throat, earache, hearing loss, dental pain, Tinnitus, Vertigo, Sinus pain, snoring.  Cardio: Denies chest pain, palpitations, irregular heartbeat, syncope, dyspnea, diaphoresis, orthopnea, PND, claudication, edema Respiratory: denies cough, dyspnea, DOE, pleurisy, hoarseness, laryngitis, wheezing.  Gastrointestinal: Denies dysphagia, heartburn, reflux, water brash, pain, cramps, nausea, vomiting, bloating, diarrhea, constipation, hematemesis, melena, hematochezia, jaundice, hemorrhoids Genitourinary: Denies dysuria, frequency, urgency, nocturia, hesitancy, discharge, hematuria, flank pain Breast: Breast lumps, nipple discharge, bleeding.  Musculoskeletal: Denies arthralgia, myalgia, stiffness, Jt. Swelling, pain, limp, and strain/sprain. Denies falls. Skin: Denies puritis, rash, hives, warts, acne, eczema, changing in skin lesion Neuro: No weakness, tremor, incoordination, spasms, paresthesia, pain Psychiatric: Denies confusion, memory loss, sensory loss. Denies Depression. Endocrine: Denies change in weight, skin, hair change, nocturia, and paresthesia, diabetic polys, visual blurring, hyper / hypo glycemic episodes.  Heme/Lymph: No excessive bleeding, bruising,  enlarged lymph nodes  Objective:     BP 126/74   Pulse 76  Temp98.1 F   Resp 16  Ht 5' 3.5"   Wt 198 lb 6.4 oz     BMI 34.59   General Appearance: Well nourished, alert, WD/WN, female and in no apparent distress. Eyes: PERRLA, EOMs, conjunctiva no swelling or erythema, normal fundi and vessels. Sinuses: No frontal/maxillary tenderness ENT/Mouth: EACs patent / TMs  nl. Nares clear without erythema, swelling, mucoid exudates. Oral hygiene is good. No erythema, swelling, or exudate. Tongue normal, non-obstructing. Tonsils not swollen or erythematous.  Hearing normal.  Neck: Supple, thyroid normal. No bruits, nodes or JVD. Respiratory: Respiratory effort normal.  BS equal and clear bilateral without rales, rhonci, wheezing or stridor. Cardio: Heart sounds are soft  with sl irregular rate and rhythm and no murmurs, rubs or gallops. Peripheral pulses are normal and equal bilaterally without edema. No aortic or femoral bruits. Chest: symmetric with normal excursions and percussion. Breasts: Symmetric, without lumps, nipple discharge, retractions, or fibrocystic changes.  Abdomen: Flat, soft  with nl bowel sounds. Nontender, no guarding, rebound, hernias, masses, or organomegaly.  Lymphatics: Non tender without lymphadenopathy.  Genitourinary:  Musculoskeletal: Full ROM all peripheral extremities, joint stability, 5/5 strength, and normal gait. Skin: Warm and dry without rashes, lesions, cyanosis, clubbing or  ecchymosis.  Neuro: Cranial nerves intact, reflexes equal bilaterally. Normal muscle tone, no cerebellar symptoms. Sensation intact.  Pysch: Awake and oriented X 3, normal affect, Insight and Judgment appropriate.   Cognitive Testing  Alert? Yes  Normal Appearance?Yes  Oriented to person? Yes  Place? Yes   Time? Yes  Recall of three objects?  Yes  Can perform simple calculations? Yes  Displays appropriate judgment? Yes  Can read the correct time from a watch/clock?Yes  Medicare  Attestation I have personally reviewed: The patient's medical and social history Their use of alcohol, tobacco or illicit drugs Their current medications and supplements The patient's functional ability including ADLs,fall risks, home safety risks, cognitive, and hearing and visual impairment Diet and physical activities Evidence for depression or mood disorders  The patient's weight, height, BMI, and visual acuity have been recorded in the chart.  I have made referrals, counseling, and provided education to the patient based on review of the above and I have provided the patient with a written personalized care plan for preventive services.    Shaquandra Galano DAVID, MD   05/01/2014

## 2014-05-02 ENCOUNTER — Encounter: Payer: Self-pay | Admitting: Internal Medicine

## 2014-05-02 ENCOUNTER — Ambulatory Visit (INDEPENDENT_AMBULATORY_CARE_PROVIDER_SITE_OTHER): Payer: Medicare Other | Admitting: Internal Medicine

## 2014-05-02 VITALS — BP 108/72 | HR 81 | Ht 64.0 in | Wt 199.2 lb

## 2014-05-02 DIAGNOSIS — I10 Essential (primary) hypertension: Secondary | ICD-10-CM

## 2014-05-02 DIAGNOSIS — E782 Mixed hyperlipidemia: Secondary | ICD-10-CM

## 2014-05-02 DIAGNOSIS — I482 Chronic atrial fibrillation, unspecified: Secondary | ICD-10-CM

## 2014-05-02 LAB — BASIC METABOLIC PANEL WITH GFR
BUN: 22 mg/dL (ref 6–23)
CALCIUM: 10 mg/dL (ref 8.4–10.5)
CO2: 26 meq/L (ref 19–32)
CREATININE: 1.16 mg/dL — AB (ref 0.50–1.10)
Chloride: 103 mEq/L (ref 96–112)
GFR, EST NON AFRICAN AMERICAN: 47 mL/min — AB
GFR, Est African American: 54 mL/min — ABNORMAL LOW
Glucose, Bld: 74 mg/dL (ref 70–99)
Potassium: 4.5 mEq/L (ref 3.5–5.3)
SODIUM: 139 meq/L (ref 135–145)

## 2014-05-02 LAB — HEPATIC FUNCTION PANEL
ALT: 13 U/L (ref 0–35)
AST: 16 U/L (ref 0–37)
Albumin: 4.2 g/dL (ref 3.5–5.2)
Alkaline Phosphatase: 38 U/L — ABNORMAL LOW (ref 39–117)
BILIRUBIN DIRECT: 0.1 mg/dL (ref 0.0–0.3)
Indirect Bilirubin: 0.5 mg/dL (ref 0.2–1.2)
TOTAL PROTEIN: 6.4 g/dL (ref 6.0–8.3)
Total Bilirubin: 0.6 mg/dL (ref 0.2–1.2)

## 2014-05-02 LAB — PROTIME-INR
INR: 2.2 — AB (ref <1.50)
PROTHROMBIN TIME: 24.4 s — AB (ref 11.6–15.2)

## 2014-05-02 LAB — INSULIN, FASTING: Insulin fasting, serum: 12.9 u[IU]/mL (ref 2.0–19.6)

## 2014-05-02 LAB — LIPID PANEL
CHOL/HDL RATIO: 5.3 ratio
CHOLESTEROL: 184 mg/dL (ref 0–200)
HDL: 35 mg/dL — AB (ref >39)
LDL Cholesterol: 106 mg/dL — ABNORMAL HIGH (ref 0–99)
Triglycerides: 214 mg/dL — ABNORMAL HIGH (ref <150)
VLDL: 43 mg/dL — ABNORMAL HIGH (ref 0–40)

## 2014-05-02 LAB — HEMOGLOBIN A1C
Hgb A1c MFr Bld: 5.9 % — ABNORMAL HIGH (ref <5.7)
Mean Plasma Glucose: 123 mg/dL — ABNORMAL HIGH (ref <117)

## 2014-05-02 LAB — TSH: TSH: 6.242 u[IU]/mL — AB (ref 0.350–4.500)

## 2014-05-02 LAB — MAGNESIUM: MAGNESIUM: 1.9 mg/dL (ref 1.5–2.5)

## 2014-05-02 LAB — DIGOXIN LEVEL: Digoxin Level: 0.9 ng/mL (ref 0.8–2.0)

## 2014-05-02 LAB — VITAMIN D 25 HYDROXY (VIT D DEFICIENCY, FRACTURES): VIT D 25 HYDROXY: 26 ng/mL — AB (ref 30–100)

## 2014-05-02 NOTE — Assessment & Plan Note (Signed)
She admits to dietary indiscretion. She states she will lose weight after I challenged her to do so. She will start exercising.

## 2014-05-02 NOTE — Assessment & Plan Note (Signed)
Her ventricular rate appears to be well controlled. No change in meds.

## 2014-05-02 NOTE — Progress Notes (Signed)
HPI Katie [name] returns today for followup. She is a very pleasant 74 year old woman with a history of chronic atrial fibrillation, class II heart failure,  Both systolic and diastolic, abdominal aortic aneurysm, and hypertension. In the interim, she has been stable. She has done well since her aneurysm surgery except that she has gained weight. She admits to dietary indiscretion.  Allergies  Allergen Reactions  . Advair Diskus [Fluticasone-Salmeterol] Other (See Comments)    SKIN CHANGES.  Can tolerate low dose of prednisone without complications  . Amiodarone Other (See Comments)    PULMONARY TOXICITY  . Codeine     unknown  . Diovan [Valsartan] Other (See Comments)    HYPOTENSION  . Doxycycline Diarrhea and Other (See Comments)    VISUAL DISTURBANCE  . Flexeril [Cyclobenzaprine] Other (See Comments)    FATIGUE  . Keflex [Cephalexin] Diarrhea  . Verapamil Other (See Comments)    EDEMA     Current Outpatient Prescriptions  Medication Sig Dispense Refill  . albuterol (PROVENTIL HFA;VENTOLIN HFA) 108 (90 BASE) MCG/ACT inhaler 2 puffs 5 minutes apart every 4 to 6 hours as needed to rescue asthma (Patient taking differently: Inhale 2 puffs into the lungs 5 minutes apart every 4 to 6 hours as needed to rescue asthma) 1 Inhaler 11  . allopurinol (ZYLOPRIM) 300 MG tablet TAKE 1 TABLET BY MOUTH DAILY 90 tablet 1  . aspirin 81 MG tablet Take 81 mg by mouth daily.      . benazepril (LOTENSIN) 20 MG tablet Take 20 mg by mouth daily.    . bisoprolol-hydrochlorothiazide (ZIAC) 5-6.25 MG per tablet Take 1 tablet by mouth daily.    Marland Kitchen dexamethasone (DECADRON) 0.1 % ophthalmic suspension Place 1 drop into both eyes daily.    . digoxin (LANOXIN) 0.25 MG tablet TAKE 1 TABLET BY MOUTH DAILY 90 tablet 3  . fluconazole (DIFLUCAN) 150 MG tablet Take 150 mg by mouth daily as needed (infection).    . furosemide (LASIX) 80 MG tablet Take 40-80 mg by mouth daily.    Marland Kitchen gabapentin (NEURONTIN) 300 MG capsule Take  1 capsule (300 mg total) by mouth 3 (three) times daily. 270 capsule 3  . ipratropium (ATROVENT) 0.02 % nebulizer solution Take 0.5 mg by nebulization every 4 (four) hours as needed for wheezing or shortness of breath.     Marland Kitchen ipratropium-albuterol (DUONEB) 0.5-2.5 (3) MG/3ML SOLN Use 3 cc  4 x day or every 4 hours if needed for shortness of breath. 360 mL 99  . levothyroxine (SYNTHROID, LEVOTHROID) 50 MCG tablet TAKE 1 TABLET EVERY DAY (Patient taking differently: TAKE 1 TABLET BY MOUTH EVERY DAY) 90 tablet 3  . montelukast (SINGULAIR) 10 MG tablet Take 1 tablet (10 mg total) by mouth daily as needed. For allergy 90 tablet 3  . pravastatin (PRAVACHOL) 40 MG tablet Take 40 mg by mouth at bedtime.      . prochlorperazine (COMPAZINE) 5 MG tablet TAKE 1 TABLET BY MOUTH 3 TIMES A DAY FOR VERTIGO OR NAUSEA 90 tablet 8  . terconazole (TERAZOL 7) 0.4 % vaginal cream Place 1 applicator vaginally at bedtime as needed (infection).    . Tiotropium Bromide Monohydrate (SPIRIVA RESPIMAT) 2.5 MCG/ACT AERS Use 1 to 2 puff once daily (Patient taking differently: Inhale 1 to 2 puffs into the lungs once daily) 4 g 99  . warfarin (COUMADIN) 2 MG tablet TAKE 1 TO 2 TABLETS BY MOUTH DAILY OR AS DIRECTED 180 tablet 3   No current facility-administered medications for this  visit.     Past Medical History  Diagnosis Date  . Atrial fibrillation   . COPD (chronic obstructive pulmonary disease)   . Bronchitis, chronic   . History of Bell's palsy   . Hypertension   . Coronary artery disease   . Migraine headache   . Atrial flutter   . Tobacco abuse   . Osteopenia   . Cervical dysplasia   . Thyroid disease     Hypo  . Elevated cholesterol   . Unspecified vitamin D deficiency   . Family history of anesthesia complication     Hard for daughter to wake up after Anesthesia  . Shortness of breath     with exertion  . Hypothyroidism   . Cystocele     either on bladder or kidneys patient is unsure but stated  physician will watch it  . Seasonal allergies   . Dizziness     If patient gets out of bed too fast  . H/O hiatal hernia   . Rosacea conjunctivitis     Right eye is worse than left eye  . Arthritis   . Gout   . Aortic stenosis     mild AS witm mild to mod AR by 11/2012 echo    ROS:   All systems reviewed and negative except as noted in the HPI.   Past Surgical History  Procedure Laterality Date  . Broken leg Left 1970s  . Laser vein procedure    . Refractive surgery    . Colonoscopy    . Cholecystectomy  1972  . Tonsillectomy    . Eye surgery Bilateral   . Cardiac catheterization    . Abdominal aortic endovascular stent graft N/A 04/11/2013    Procedure: ABDOMINAL AORTIC ENDOVASCULAR STENT GRAFT WITH RIGHT FEMORAL PATCH ANGIOPLASTY;  Surgeon: Mal Misty, MD;  Location: [facility] OR;  Service: Vascular;  Laterality: N/A;  . Breast surgery      LEFT BREAST BIOPSY     Family History  Problem Relation Age of Onset  . Cirrhosis Mother   . Cancer Mother 23    PANCREAS  . Heart defect Sister   . Breast cancer Sister     age 85  . Heart disease Sister   . Stroke Sister   . Alcohol abuse Father   . Depression Father   . Hypertension Brother   . Hyperlipidemia Son   . Heart disease Daughter      History   Social History  . Marital Status: Married    Spouse Name: N/A  . Number of Children: N/A  . Years of Education: N/A   Occupational History  . Not on file.   Social History Main Topics  . Smoking status: Former Smoker    Types: Cigarettes    Quit date: 08/10/2002  . Smokeless tobacco: Never Used     Comment: History of tobacco abuse  . Alcohol Use: 0.0 oz/week     Comment: rare  . Drug Use: No  . Sexual Activity: Yes    Birth Control/ Protection: Post-menopausal   Other Topics Concern  . Not on file   Social History Narrative   Lives in [place] with husband   One daughter     BP 108/72 mmHg  Pulse 81  Ht 5\' 4"  (1.626 m)  Wt 199 lb 3.2 oz  (90.357 kg)  BMI 34.18 kg/m2  Physical Exam:  Well appearing 74 year old woman, NAD HEENT: Unremarkable Neck:  7 cm JVD, no thyromegally Back:  No CVA tenderness Lungs:  Clear except for scattered rales in the bases. No wheezes or rhonchi. HEART:  IRegular rate rhythm, grade 2/6 systolic murmurs, no rubs, no clicks, her S2 is reduced. Abd:  soft, positive bowel sounds, no organomegally, no rebound, no guarding, a bruit is present Ext:  2 plus pulses, no edema, no cyanosis, no clubbing Skin:  No rashes no nodules Neuro:  CN II through XII intact, motor grossly intact   Assess/Plan:

## 2014-05-02 NOTE — Patient Instructions (Signed)
Your physician wants you to follow-up in: 12 months with Dr. Taylor. You will receive a reminder letter in the mail two months in advance. If you don't receive a letter, please call our office to schedule the follow-up appointment.    

## 2014-05-02 NOTE — Assessment & Plan Note (Signed)
Her blood pressure is well controlled. I have asked that she lose weight. Will follow.

## 2014-05-08 ENCOUNTER — Ambulatory Visit (INDEPENDENT_AMBULATORY_CARE_PROVIDER_SITE_OTHER): Payer: Medicare Other | Admitting: Women's Health

## 2014-05-08 ENCOUNTER — Encounter: Payer: Self-pay | Admitting: Women's Health

## 2014-05-08 ENCOUNTER — Telehealth: Payer: Self-pay | Admitting: *Deleted

## 2014-05-08 ENCOUNTER — Ambulatory Visit (INDEPENDENT_AMBULATORY_CARE_PROVIDER_SITE_OTHER): Payer: Medicare Other

## 2014-05-08 VITALS — BP 124/80 | Ht 64.0 in | Wt 199.0 lb

## 2014-05-08 DIAGNOSIS — R350 Frequency of micturition: Secondary | ICD-10-CM

## 2014-05-08 DIAGNOSIS — N3000 Acute cystitis without hematuria: Secondary | ICD-10-CM

## 2014-05-08 DIAGNOSIS — N950 Postmenopausal bleeding: Secondary | ICD-10-CM | POA: Diagnosis not present

## 2014-05-08 LAB — URINALYSIS W MICROSCOPIC + REFLEX CULTURE
Bilirubin Urine: NEGATIVE
CASTS: NONE SEEN
Crystals: NONE SEEN
GLUCOSE, UA: NEGATIVE mg/dL
Ketones, ur: NEGATIVE mg/dL
Nitrite: NEGATIVE
PH: 7 (ref 5.0–8.0)
PROTEIN: NEGATIVE mg/dL
Specific Gravity, Urine: 1.01 (ref 1.005–1.030)
Urobilinogen, UA: 0.2 mg/dL (ref 0.0–1.0)

## 2014-05-08 LAB — WET PREP FOR TRICH, YEAST, CLUE
CLUE CELLS WET PREP: NONE SEEN
TRICH WET PREP: NONE SEEN
YEAST WET PREP: NONE SEEN

## 2014-05-08 NOTE — Telephone Encounter (Signed)
Patient advised to call GYN in regard to vaginal spotting.

## 2014-05-08 NOTE — Progress Notes (Signed)
Patient ID: ANIYJAH LAURANCE, female   DOB: 14-May-1940, 74 y.o.   MRN: [id number] Presents with complaint of painless postmenopausal bleeding for 2 days. Noted  red blood yesterday and mild spotting today. On Coumadin. Informed  Cardiologist, said levels are in the normal range, not causing bleeding. Increased frequency relates to Lasix, denies pain or burning with urination. Reports vaginal odor, mild vaginal itching. Denies abdominal pain or fever. Has numerous health issues including peripheral vascular disease, hypertension, COPD, CHF, atrial fibrillation, hypothyroidism. Rare sexual activity.  Exam: Appears well. Color good, mild shortness of breath with dressing. External genitalia mild erythema at introitus, speculum exam no visible blood, discharge, mild atrophy, wet prep negative. UA: Trace blood, moderate leukocytes, TNTC WBCs, 3-6 RBCs,, few bacteria. Ultrasound: Transvaginal uterus anteverted homogeneous. Endometrium  2.7 mm. Right and left ovary normal atrophic. Negative cul-de-sac. No apparent masses.  Rule out UTI Thin endometrium/postmenopausal bleeding on Coumadin  Plan: Urine culture pending. Reviewed normality of ultrasound, thin endometrium, if any further  bleeding instructed to call or return to office. Continue care with primary care/cardiologist.

## 2014-05-08 NOTE — Telephone Encounter (Signed)
Patient called and states she is having vaginal spotting that started yesterday and is light this AM.  Patient concerned it could be due to her coumadin level. Per Dr Melford Aase, patient needs to see her GYN.  Patient aware.

## 2014-05-11 LAB — URINE CULTURE: Colony Count: 3000

## 2014-05-13 ENCOUNTER — Other Ambulatory Visit: Payer: Self-pay | Admitting: Women's Health

## 2014-05-13 ENCOUNTER — Telehealth: Payer: Self-pay

## 2014-05-13 MED ORDER — FLUCONAZOLE 150 MG PO TABS: 150.0000 mg | ORAL_TABLET | Freq: Every day | ORAL | Status: DC | PRN

## 2014-05-13 NOTE — Telephone Encounter (Signed)
-----   Message from Huel Cote, NP sent at 05/13/2014  2:35 PM EST ----- Please call and review urine culture was neg.

## 2014-05-13 NOTE — Telephone Encounter (Signed)
I informed patient urine culture neg. She said that is good but she still have white vaginal discharge with odor. She said you told her to did not smell an odor but she said that she does. Rec?

## 2014-05-13 NOTE — Telephone Encounter (Signed)
TC  States thinks has yeast, similar odor when has yeast has taken diflucan in the past without prob.  Will try diflucan 150, will call if no relief.

## 2014-05-23 ENCOUNTER — Other Ambulatory Visit: Payer: Self-pay | Admitting: Internal Medicine

## 2014-05-31 ENCOUNTER — Ambulatory Visit (INDEPENDENT_AMBULATORY_CARE_PROVIDER_SITE_OTHER): Payer: Medicare Other | Admitting: Physician Assistant

## 2014-05-31 ENCOUNTER — Encounter: Payer: Self-pay | Admitting: Physician Assistant

## 2014-05-31 VITALS — BP 110/70 | HR 56 | Temp 98.0°F | Resp 16 | Ht 63.5 in | Wt 202.0 lb

## 2014-05-31 DIAGNOSIS — Z7901 Long term (current) use of anticoagulants: Secondary | ICD-10-CM | POA: Diagnosis not present

## 2014-05-31 DIAGNOSIS — R319 Hematuria, unspecified: Secondary | ICD-10-CM | POA: Diagnosis not present

## 2014-05-31 DIAGNOSIS — N183 Chronic kidney disease, stage 3 unspecified: Secondary | ICD-10-CM

## 2014-05-31 DIAGNOSIS — I482 Chronic atrial fibrillation, unspecified: Secondary | ICD-10-CM

## 2014-05-31 DIAGNOSIS — Z79899 Other long term (current) drug therapy: Secondary | ICD-10-CM | POA: Diagnosis not present

## 2014-05-31 DIAGNOSIS — Z5181 Encounter for therapeutic drug level monitoring: Secondary | ICD-10-CM | POA: Diagnosis not present

## 2014-05-31 LAB — CBC WITH DIFFERENTIAL/PLATELET
Basophils Absolute: 0 10*3/uL (ref 0.0–0.1)
Basophils Relative: 0 % (ref 0–1)
EOS ABS: 0.3 10*3/uL (ref 0.0–0.7)
Eosinophils Relative: 4 % (ref 0–5)
HCT: 44.4 % (ref 36.0–46.0)
Hemoglobin: 15 g/dL (ref 12.0–15.0)
LYMPHS ABS: 1.5 10*3/uL (ref 0.7–4.0)
Lymphocytes Relative: 20 % (ref 12–46)
MCH: 31.4 pg (ref 26.0–34.0)
MCHC: 33.8 g/dL (ref 30.0–36.0)
MCV: 92.9 fL (ref 78.0–100.0)
MONOS PCT: 10 % (ref 3–12)
MPV: 10.3 fL (ref 8.6–12.4)
Monocytes Absolute: 0.8 10*3/uL (ref 0.1–1.0)
NEUTROS ABS: 5 10*3/uL (ref 1.7–7.7)
NEUTROS PCT: 66 % (ref 43–77)
Platelets: 225 10*3/uL (ref 150–400)
RBC: 4.78 MIL/uL (ref 3.87–5.11)
RDW: 14.9 % (ref 11.5–15.5)
WBC: 7.5 10*3/uL (ref 4.0–10.5)

## 2014-05-31 NOTE — Patient Instructions (Signed)

## 2014-05-31 NOTE — Progress Notes (Addendum)
Coumadin follow up  Patient is on Coumadin for Primary Diagnosis: Chronic atrial fibrillation [I48.2] Patient's last INR is  Lab Results  Component Value Date   INR 2.20* 05/01/2014   INR 2.43* 04/02/2014   INR 1.77* 03/18/2014    Patient denies SOB, CP, dizziness, nose bleeds, easy bleeding, and blood in stool/urine. Her coumadin dose was not changed last visit, she is on 2 mg x 4 days and 3 mg x 3 days. She has not taken ABX, has not missed any doses and denies a fall.   She has 40 + year smoking history, quit in 2004, has had small spots on her pad that she wears for incontinence for 1 month, she saw her OB/GYN, Elon Alas, NP and had a UA with hematuria, leukocytes and was contaminated without growth on the 24, she had a pelvic US that showed normal endometrium of 2.7. She was given diflucan once for yeast, which helped for a while but then did not. She continues to have trace amounts of blood on her pad and has been having urinary frequency with suprapubic cramping/discomfort. She had a normal CT Angio ABD/Pelvis on 04/16/2014 by Dr. Kellie Simmering to monitor her AAA repair. Denies nausea, fever, chills.    Current Outpatient Prescriptions on File Prior to Visit  Medication Sig Dispense Refill  . albuterol (PROVENTIL HFA;VENTOLIN HFA) 108 (90 BASE) MCG/ACT inhaler 2 puffs 5 minutes apart every 4 to 6 hours as needed to rescue asthma (Patient taking differently: Inhale 2 puffs into the lungs 5 minutes apart every 4 to 6 hours as needed to rescue asthma) 1 Inhaler 11  . allopurinol (ZYLOPRIM) 300 MG tablet TAKE 1 TABLET BY MOUTH DAILY 90 tablet 1  . aspirin 81 MG tablet Take 81 mg by mouth daily.      . benazepril (LOTENSIN) 20 MG tablet Take 20 mg by mouth daily.    . bisoprolol-hydrochlorothiazide (ZIAC) 5-6.25 MG per tablet Take 1 tablet by mouth daily.    Marland Kitchen dexamethasone (DECADRON) 0.1 % ophthalmic suspension Place 1 drop into both eyes daily.    . digoxin (LANOXIN) 0.25 MG tablet TAKE 1 TABLET  BY MOUTH DAILY 90 tablet 3  . fluconazole (DIFLUCAN) 150 MG tablet Take 1 tablet (150 mg total) by mouth daily as needed (infection). 1 tablet 0  . furosemide (LASIX) 80 MG tablet Take 40-80 mg by mouth daily.    . furosemide (LASIX) 80 MG tablet TAKE 1 TABLET BY MOUTH TWICE A DAY AS NEEDED FOR FLUID 90 tablet 2  . gabapentin (NEURONTIN) 300 MG capsule Take 1 capsule (300 mg total) by mouth 3 (three) times daily. 270 capsule 3  . ipratropium (ATROVENT) 0.02 % nebulizer solution Take 0.5 mg by nebulization every 4 (four) hours as needed for wheezing or shortness of breath.     Marland Kitchen ipratropium-albuterol (DUONEB) 0.5-2.5 (3) MG/3ML SOLN Use 3 cc  4 x day or every 4 hours if needed for shortness of breath. 360 mL 99  . levothyroxine (SYNTHROID, LEVOTHROID) 50 MCG tablet TAKE 1 TABLET EVERY DAY (Patient taking differently: TAKE 1 TABLET BY MOUTH EVERY DAY) 90 tablet 3  . montelukast (SINGULAIR) 10 MG tablet Take 1 tablet (10 mg total) by mouth daily as needed. For allergy 90 tablet 3  . pravastatin (PRAVACHOL) 40 MG tablet Take 40 mg by mouth at bedtime.      . prochlorperazine (COMPAZINE) 5 MG tablet TAKE 1 TABLET BY MOUTH 3 TIMES A DAY FOR VERTIGO OR NAUSEA 90 tablet  8  . terconazole (TERAZOL 7) 0.4 % vaginal cream Place 1 applicator vaginally at bedtime as needed (infection).    . Tiotropium Bromide Monohydrate (SPIRIVA RESPIMAT) 2.5 MCG/ACT AERS Use 1 to 2 puff once daily (Patient taking differently: Inhale 1 to 2 puffs into the lungs once daily) 4 g 99  . warfarin (COUMADIN) 2 MG tablet TAKE 1 TO 2 TABLETS BY MOUTH DAILY OR AS DIRECTED 180 tablet 3   No current facility-administered medications on file prior to visit.   Past Medical History  Diagnosis Date  . Atrial fibrillation   . Bronchitis, chronic   . History of Bell's palsy   . Coronary artery disease   . Migraine headache   . Atrial flutter   . Tobacco abuse   . Osteopenia   . Cervical dysplasia   . Thyroid disease     Hypo  .  Unspecified vitamin D deficiency   . Family history of anesthesia complication     Hard for daughter to wake up after Anesthesia  . Shortness of breath     with exertion  . Cystocele     either on bladder or kidneys patient is unsure but stated physician will watch it  . Seasonal allergies   . Dizziness     If patient gets out of bed too fast  . H/O hiatal hernia   . Rosacea conjunctivitis     Right eye is worse than left eye  . Arthritis   . Gout   . Aortic stenosis     mild AS witm mild to mod AR by 11/2012 echo   Allergies  Allergen Reactions  . Advair Diskus [Fluticasone-Salmeterol] Other (See Comments)    SKIN CHANGES.  Can tolerate low dose of prednisone without complications  . Amiodarone Other (See Comments)    PULMONARY TOXICITY  . Codeine     unknown  . Diovan [Valsartan] Other (See Comments)    HYPOTENSION  . Doxycycline Diarrhea and Other (See Comments)    VISUAL DISTURBANCE  . Flexeril [Cyclobenzaprine] Other (See Comments)    FATIGUE  . Keflex [Cephalexin] Diarrhea  . Verapamil Other (See Comments)    EDEMA   Review of Systems  Constitutional: Positive for malaise/fatigue. Negative for fever, chills, weight loss and diaphoresis.  HENT: Negative.   Eyes: Negative.   Respiratory: Positive for shortness of breath (chronic unchanged). Negative for cough, hemoptysis, sputum production and wheezing.   Cardiovascular: Negative.   Gastrointestinal: Negative.   Genitourinary: Negative.   Skin: Negative.   Neurological: Negative for weakness.    Physical: Blood pressure 110/70, pulse 56, temperature 98 F (36.7 C), resp. rate 16, height 5' 3.5" (1.613 m), weight 202 lb (91.627 kg). Filed Weights   05/31/14 1004  Weight: 202 lb (91.627 kg)    General Appearance: Well nourished, in no apparent distress. ENT/Mouth: Nares clear with no erythema, swelling, mucus on turbinates. No ulcers, cracking, on lips. No erythema, swelling, or exudate on post pharynx.   Neck: Supple, thyroid normal.  Respiratory: CTAB   Cardio: Irregular, irregular rhythm, no murmurs, rubs or gallops. No edema  Abdomen: Soft, with bowl sounds. + suprapubic tenderness without CVA tenderness, no guarding, rebound, hernias, masses, or organomegaly.  Skin: Warm, dry without rashes, lesions, ecchymosis.  Neuro: Unremarkable  Assessment and plan: Chronic anticoagulation- check INR and will adjust medication according to labs.  Discussed if patient falls to immediately contact office or go to ER. Discussed foods that can increase or decrease Coumadin levels.  Patient understands to call the office before starting a new medication. Follow up in one month.   Digoxin medication management- check level of digoxin  Hematuria- in patient on coumadin, remote history of smoking, hx of kidney stones, with cystocele, and with suprapubic pain, no CVA tenderness- recheck BMP, CBC, UA C&S- OB/GYN states it is not from cystocele and patient declines exam today, Will recheck urine, if continues to have hematuria, may need urology referral.    Addendum: With continuing discomfort/normal work up from OB/GYN, and remote smoking history, will refer to urologist for evaluation and will offer samples of vesicare to try in the mean time.

## 2014-06-01 LAB — URINALYSIS, MICROSCOPIC ONLY
Casts: NONE SEEN
Crystals: NONE SEEN
Squamous Epithelial / LPF: NONE SEEN

## 2014-06-01 LAB — BASIC METABOLIC PANEL WITH GFR
BUN: 20 mg/dL (ref 6–23)
CO2: 23 mEq/L (ref 19–32)
CREATININE: 1.1 mg/dL (ref 0.50–1.10)
Calcium: 9.7 mg/dL (ref 8.4–10.5)
Chloride: 101 mEq/L (ref 96–112)
GFR, Est African American: 58 mL/min — ABNORMAL LOW
GFR, Est Non African American: 50 mL/min — ABNORMAL LOW
Glucose, Bld: 91 mg/dL (ref 70–99)
Potassium: 4.4 mEq/L (ref 3.5–5.3)
Sodium: 135 mEq/L (ref 135–145)

## 2014-06-01 LAB — PROTIME-INR
INR: 3.51 — AB (ref <1.50)
PROTHROMBIN TIME: 35.2 s — AB (ref 11.6–15.2)

## 2014-06-01 LAB — DIGOXIN LEVEL: DIGOXIN LVL: 1.3 ng/mL (ref 0.8–2.0)

## 2014-06-02 LAB — URINE CULTURE

## 2014-06-03 NOTE — Addendum Note (Signed)
Addended by: Vicie Mutters R on: 06/03/2014 08:42 AM   Modules accepted: Orders

## 2014-06-12 ENCOUNTER — Other Ambulatory Visit: Payer: Self-pay | Admitting: Physician Assistant

## 2014-06-12 ENCOUNTER — Telehealth: Payer: Self-pay | Admitting: Internal Medicine

## 2014-06-12 DIAGNOSIS — R319 Hematuria, unspecified: Secondary | ICD-10-CM

## 2014-06-12 DIAGNOSIS — Z7901 Long term (current) use of anticoagulants: Secondary | ICD-10-CM

## 2014-06-12 DIAGNOSIS — N183 Chronic kidney disease, stage 3 unspecified: Secondary | ICD-10-CM

## 2014-06-12 NOTE — Telephone Encounter (Signed)
Pt is in alot of pain from Bladder Infection, requests referral to URO, and anything she could take for releif and pain.  Thank you, Katrina Judeth Horn [facility] [phone] Fax [phone]

## 2014-06-14 ENCOUNTER — Other Ambulatory Visit: Payer: Self-pay | Admitting: Emergency Medicine

## 2014-06-14 ENCOUNTER — Ambulatory Visit: Payer: Self-pay | Admitting: Internal Medicine

## 2014-06-17 ENCOUNTER — Encounter: Payer: Self-pay | Admitting: Internal Medicine

## 2014-06-17 ENCOUNTER — Ambulatory Visit (INDEPENDENT_AMBULATORY_CARE_PROVIDER_SITE_OTHER): Payer: Medicare Other | Admitting: Internal Medicine

## 2014-06-17 VITALS — BP 132/70 | HR 82 | Temp 98.4°F | Resp 18 | Ht 63.5 in

## 2014-06-17 DIAGNOSIS — J0141 Acute recurrent pansinusitis: Secondary | ICD-10-CM

## 2014-06-17 MED ORDER — AZITHROMYCIN 250 MG PO TABS: ORAL_TABLET | ORAL | Status: DC

## 2014-06-17 MED ORDER — PREDNISONE 20 MG PO TABS: ORAL_TABLET | ORAL | Status: DC

## 2014-06-17 MED ORDER — BENZONATATE 100 MG PO CAPS: 100.0000 mg | ORAL_CAPSULE | Freq: Four times a day (QID) | ORAL | Status: DC | PRN

## 2014-06-17 NOTE — Patient Instructions (Signed)

## 2014-06-17 NOTE — Progress Notes (Signed)
Patient ID: Katie [name], female   DOB: 11-Feb-1941, 74 y.o.   MRN: [id number]  HPI  Patient presents to the office for evaluation of cough.  It has been going on for 5 days.  Patient reports wet cough with yellow green sputum production.  They also endorse change in voice, postnasal drip, shortness of breath, sputum production, wheezing and sinus pressure, sinus congestion, rhinorrhea.  .  They have tried antihistamines.  They report that nothing has worked.  They denies other sick contacts.  Review of Systems  Constitutional: Positive for malaise/fatigue. Negative for fever and chills.  HENT: Positive for congestion and ear pain. Negative for ear discharge, sore throat and tinnitus.   Eyes: Negative.   Respiratory: Positive for cough, sputum production, shortness of breath and wheezing.   Cardiovascular: Negative for chest pain, palpitations and leg swelling.  Neurological: Positive for headaches. Negative for weakness.    PE:  General:  Alert and non-toxic, WDWN, NAD HEENT: NCAT, PERLA, EOM normal, no occular discharge or erythema.  Nasal mucosal edema with sinus tenderness to palpation.  Oropharynx clear with minimal oropharyngeal edema and erythema.  Mucous membranes moist and pink. Neck:  Cervical adenopathy Chest:  RRR no MRGs.  Lungs clear to auscultation A&P with no wheezes rhonchi or rales.   Abdomen: +BS x 4 quadrants, soft, non-tender, no guarding, rigidity, or rebound. Skin: warm and dry no rash Neuro: A&Ox4, CN II-XII grossly intact  Assessment and Plan:   1. Acute recurrent pansinusitis Viral infection vs. Allergic rhinitis vs. Bacterial infection.  Will treat as seen below.  Patient to follow up prn.  Patient to return for worsening symptoms or if no improvement.    - predniSONE (DELTASONE) 20 MG tablet; 3 tabs po day one, then 2 tabs daily x 4 days  Dispense: 11 tablet; Refill: 0 - benzonatate (TESSALON PERLES) 100 MG capsule; Take 1 capsule (100 mg total) by mouth  every 6 (six) hours as needed for cough.  Dispense: 30 capsule; Refill: 1 - azithromycin (ZITHROMAX Z-PAK) 250 MG tablet; 2 po day one, then 1 daily x 4 days  Dispense: 5 tablet; Refill: 0

## 2014-06-25 DIAGNOSIS — R310 Gross hematuria: Secondary | ICD-10-CM | POA: Diagnosis not present

## 2014-06-25 DIAGNOSIS — R339 Retention of urine, unspecified: Secondary | ICD-10-CM | POA: Diagnosis not present

## 2014-07-10 ENCOUNTER — Ambulatory Visit (INDEPENDENT_AMBULATORY_CARE_PROVIDER_SITE_OTHER): Payer: Medicare Other | Admitting: Internal Medicine

## 2014-07-10 ENCOUNTER — Encounter: Payer: Self-pay | Admitting: Internal Medicine

## 2014-07-10 VITALS — BP 126/80 | HR 64 | Temp 97.3°F | Resp 16 | Ht 63.5 in | Wt 201.6 lb

## 2014-07-10 DIAGNOSIS — Z7901 Long term (current) use of anticoagulants: Secondary | ICD-10-CM

## 2014-07-10 DIAGNOSIS — Z79899 Other long term (current) drug therapy: Secondary | ICD-10-CM

## 2014-07-10 DIAGNOSIS — I10 Essential (primary) hypertension: Secondary | ICD-10-CM

## 2014-07-10 DIAGNOSIS — E039 Hypothyroidism, unspecified: Secondary | ICD-10-CM

## 2014-07-10 DIAGNOSIS — E559 Vitamin D deficiency, unspecified: Secondary | ICD-10-CM | POA: Diagnosis not present

## 2014-07-10 DIAGNOSIS — E782 Mixed hyperlipidemia: Secondary | ICD-10-CM | POA: Diagnosis not present

## 2014-07-10 DIAGNOSIS — R7309 Other abnormal glucose: Secondary | ICD-10-CM

## 2014-07-10 DIAGNOSIS — R7303 Prediabetes: Secondary | ICD-10-CM

## 2014-07-10 DIAGNOSIS — I482 Chronic atrial fibrillation, unspecified: Secondary | ICD-10-CM

## 2014-07-10 NOTE — Patient Instructions (Signed)
Recommend Low dose or baby Aspirin 81 mg daily   To reduce risk of Colon Cancer 20 %, Skin Cancer 26 % , Melanoma 46% and   Pancreatic cancer 60%  ++++++++++++++++++ Vitamin D goal is between 70-100.   Please make sure that you are taking your Vitamin D as directed.   It is very important as a natural antiinflammatory   helping hair, skin, and nails, as well as reducing stroke and heart attack risk.   It helps your bones and helps with mood.  It also decreases numerous cancer risks so please take it as directed.   Low Vit D is associated with a 200-300% higher risk for CANCER   and 200-300% higher risk for HEART   ATTACK  &  STROKE.    ......................................  It is also associated with higher death rate at younger ages,   autoimmune diseases like Rheumatoid arthritis, Lupus, Multiple Sclerosis.     Also many other serious conditions, like depression, Alzheimer's  Dementia, infertility, muscle aches, fatigue, fibromyalgia - just to name a few.  +++++++++++++++++++    Recommend the book "The END of DIETING" by Dr Joel Fuhrman   & the book "The END of DIABETES " by Dr Joel Fuhrman  At [employer] - get book & Audio CD's     Being diabetic has a  300% increased risk for heart attack, stroke, cancer, and alzheimer- type vascular dementia. It is very important that you work harder with diet by avoiding all foods that are white. Avoid white rice (brown & wild rice is OK), white potatoes (sweetpotatoes in moderation is OK), White bread or wheat bread or anything made out of white flour like bagels, donuts, rolls, buns, biscuits, cakes, pastries, cookies, pizza crust, and pasta (made from white flour & egg whites) - vegetarian pasta or spinach or wheat pasta is OK. Multigrain breads like Arnold's or Pepperidge [employer], or multigrain sandwich thins or flatbreads.  Diet, exercise and weight loss can reverse and cure diabetes in the early stages.  Diet, exercise and weight  loss is very important in the control and prevention of complications of diabetes which affects every system in your body, ie. Brain - dementia/stroke, eyes - glaucoma/blindness, heart - heart attack/heart failure, kidneys - dialysis, stomach - gastric paralysis, intestines - malabsorption, nerves - severe painful neuritis, circulation - gangrene & loss of a leg(s), and finally cancer and Alzheimers.    I recommend avoid fried & greasy foods,  sweets/candy, white rice (brown or wild rice or Quinoa is OK), white potatoes (sweet potatoes are OK) - anything made from white flour - bagels, doughnuts, rolls, buns, biscuits,white and wheat breads, pizza crust and traditional pasta made of white flour & egg white(vegetarian pasta or spinach or wheat pasta is OK).  Multi-grain bread is OK - like multi-grain flat bread or sandwich thins. Avoid alcohol in excess. Exercise is also important.    Eat all the vegetables you want - avoid meat, especially red meat and dairy - especially cheese.  Cheese is the most concentrated form of trans-fats which is the worst thing to clog up our arteries. Veggie cheese is OK which can be found in the fresh produce section at [employer] or [employer] or [employer]  ++++++++++++++++++++++++++  

## 2014-07-10 NOTE — Progress Notes (Signed)
Patient ID: Katie [name], female   DOB: 20-Jul-1940, 74 y.o.   MRN: [id number]   This very nice 74 y.o. MWF presents for 3 month follow up with Hypertension, Hyperlipidemia, Pre-Diabetes and Vitamin D Deficiency.  Hypothyroidism is compensated.    Patient is treated for HTN & BP has been controlled at home. Today's BP: 126/80 mmHg. Patient has had no complaints of any cardiac type chest pain, palpitations, dyspnea/orthopnea/PND, dizziness, claudication, or dependent edema.   Hyperlipidemia is not  controlled with diet & meds. Patient denies myalgias or other med SE's. Last Lipids were Total Chol 184; HDL 35; with elevated LDL  106; and elevated Triglycerides 214 on 05/01/2014.    Also, the patient has history of PreDiabetes and has had no symptoms of reactive hypoglycemia, diabetic polys, paresthesias or visual blurring.  Last A1c was  5.9% on 05/01/2014.   Further, the patient also has history of Vitamin D Deficiency and supplements vitamin D without any suspected side-effects. Last vitamin D was very low  26 on 05/01/2014.     Medication Sig  . albuterol  HFA inhaler 2 puffs 5 minutes apart every 4 to 6 hours as needed to rescue asthma   . allopurinol  300 MG tablet TAKE 1 TABLET BY MOUTH DAILY  . aspirin 81 MG tablet Take 81 mg by mouth daily.    . benazepril (LOTENSIN) 20 MG tablet Take 20 mg by mouth daily.  . bisoprolol-hctz (ZIAC) 5-6.25 MG per tablet Take 1 tablet by mouth daily.  Marland Kitchen dexamethasone  0.1 % ophth susp Place 1 drop into both eyes daily.  . digoxin (LANOXIN) 0.25 MG tablet TAKE 1 TABLET BY MOUTH DAILY  . fluconazole (DIFLUCAN) 150 MG tablet Take 1 tablet (150 mg total) by mouth daily as needed (infection).  . furosemide (LASIX) 80 MG tablet Take 40-80 mg by mouth daily.  . furosemide (LASIX) 80 MG tablet TAKE 1 TABLET BY MOUTH TWICE A DAY AS NEEDED FOR FLUID  . gabapentin (NEURONTIN) 300 MG capsule Take 1 capsule (300 mg total) by mouth 3 (three) times daily.  Marland Kitchen ipratropium  (ATROVENT) 0.02 % nebulizer solution Take 0.5 mg by nebulization every 4 (four) hours as needed for wheezing or shortness of breath.   . DUONEB 0.5-2.5 (3) MG/3ML S Use 3 cc  4 x day or every 4 hours if needed for shortness of breath.  . levothyroxine  50 MCG tablet TAKE 1 TABLET EVERY DAY (Patient taking differently: TAKE 1 TABLET BY MOUTH EVERY DAY)  . montelukast (SINGULAIR) 10 MG tablet Take 1 tablet (10 mg total) by mouth daily as needed. For allergy  . pravastatin (PRAVACHOL) 40 MG tablet Take 40 mg by mouth at bedtime.    . prochlorperazine (COMPAZINE) 5 MG tablet TAKE 1 TABLET BY MOUTH 3 TIMES A DAY FOR VERTIGO OR NAUSEA  . terconazole (TERAZOL 7) 0.4 % vagcrm Place 1 applicator vaginally at bedtime as needed (infection).  . Tiotropium  (SPIRIVA RESPIMAT)  Use 1 to 2 puff once daily (Patient taking differently: Inhale 1 to 2 puffs into the lungs once daily)  . warfarin (COUMADIN) 2 MG tablet TAKE 1 TO 2 TABLETS BY MOUTH DAILY OR AS DIRECTED   Allergies  Allergen Reactions  . Advair Diskus [Fluticasone-Salmeterol] Other (See Comments)    SKIN CHANGES.  Can tolerate low dose of prednisone without complications  . Amiodarone Other (See Comments)    PULMONARY TOXICITY  . Codeine     unknown  . Diovan [Valsartan]  Other (See Comments)    HYPOTENSION  . Doxycycline Diarrhea and Other (See Comments)    VISUAL DISTURBANCE  . Flexeril [Cyclobenzaprine] Other (See Comments)    FATIGUE  . Keflex [Cephalexin] Diarrhea  . Verapamil Other (See Comments)    EDEMA   PMHx:   Past Medical History  Diagnosis Date  . Atrial fibrillation   . Bronchitis, chronic   . History of Bell's palsy   . Coronary artery disease   . Migraine headache   . Atrial flutter   . Tobacco abuse   . Osteopenia   . Cervical dysplasia   . Thyroid disease     Hypo  . Unspecified vitamin D deficiency   . Family history of anesthesia complication     Hard for daughter to wake up after Anesthesia  . Shortness of  breath     with exertion  . Cystocele     either on bladder or kidneys patient is unsure but stated physician will watch it  . Seasonal allergies   . Dizziness     If patient gets out of bed too fast  . H/O hiatal hernia   . Rosacea conjunctivitis     Right eye is worse than left eye  . Arthritis   . Gout   . Aortic stenosis     mild AS witm mild to mod AR by 11/2012 echo   Immunization History  Administered Date(s) Administered  . Influenza, High Dose Seasonal PF 12/20/2013  . Influenza-Unspecified 01/02/2013  . Pneumococcal Conjugate-13 01/23/2014  . Pneumococcal-Unspecified 03/15/1993, 05/31/2008  . Td 03/16/2007  . Varicella 02/19/2008   Past Surgical History  Procedure Laterality Date  . Broken leg Left 1970s  . Laser vein procedure    . Refractive surgery    . Colonoscopy    . Cholecystectomy  1972  . Tonsillectomy    . Eye surgery Bilateral   . Cardiac catheterization    . Abdominal aortic endovascular stent graft N/A 04/11/2013    Procedure: ABDOMINAL AORTIC ENDOVASCULAR STENT GRAFT WITH RIGHT FEMORAL PATCH ANGIOPLASTY;  Surgeon: Mal Misty, MD;  Location: [facility] OR;  Service: Vascular;  Laterality: N/A;  . Breast surgery      LEFT BREAST BIOPSY   FHx:    Reviewed / unchanged  SHx:    Reviewed / unchanged  Systems Review:  Constitutional: Denies fever, chills, wt changes, headaches, insomnia, fatigue, night sweats, change in appetite. Eyes: Denies redness, blurred vision, diplopia, discharge, itchy, watery eyes.  ENT: Denies discharge, congestion, post nasal drip, epistaxis, sore throat, earache, hearing loss, dental pain, tinnitus, vertigo,snoring, but does c/o sinus pain and congestion and yellow green mucopurulent nasal secretions and had recently been tx'd with a Zpak. CV: Denies chest pain, palpitations, irregular heartbeat, syncope, dyspnea, diaphoresis, orthopnea, PND, claudication or edema. Respiratory: denies cough, dyspnea, DOE, pleurisy, hoarseness,  laryngitis, wheezing.  Gastrointestinal: Denies dysphagia, odynophagia, heartburn, reflux, water brash, abdominal pain or cramps, nausea, vomiting, bloating, diarrhea, constipation, hematemesis, melena, hematochezia  or hemorrhoids. Genitourinary: Denies dysuria, frequency, urgency, nocturia, hesitancy, discharge, hematuria or flank pain. Musculoskeletal: Denies arthralgias, myalgias, stiffness, jt. swelling, pain, limping or strain/sprain.  Skin: Denies pruritus, rash, hives, warts, acne, eczema or change in skin lesion(s). Neuro: No weakness, tremor, incoordination, spasms, paresthesia or pain. Psychiatric: Denies confusion, memory loss or sensory loss. Endo: Denies change in weight, skin or hair change.  Heme/Lymph: No excessive bleeding, bruising or enlarged lymph nodes.  Physical Exam  BP 126/80 mmHg  Pulse 64  Temp(Src) 97.3 F (36.3 C)  Resp 16  Ht 5' 3.5" (1.613 m)  Wt 201 lb 9.6 oz (91.445 kg)  BMI 35.15 kg/m2  Appears well nourished and in no distress. Eyes: PERRLA, EOMs, conjunctiva no swelling or erythema. Sinuses: No frontal/maxillary tenderness ENT/Mouth: EAC's clear, TM's nl w/o erythema, bulging. Nares clear w/o erythema, swelling, exudates. Oropharynx clear without erythema or exudates. Oral hygiene is good. Tongue normal, non obstructing. Hearing intact.  Neck: Supple. Thyroid nl. Car 2+/2+ without bruits, nodes or JVD. Chest: Respirations nl with BS clear & equal w/o rales, rhonchi, wheezing or stridor.  Cor: Heart sounds normal w/ regular rate and rhythm without sig. murmurs, gallops, clicks, or rubs. Peripheral pulses normal and equal  without edema.  Abdomen: Soft & bowel sounds normal. Non-tender w/o guarding, rebound, hernias, masses, or organomegaly.  Lymphatics: Unremarkable.  Musculoskeletal: Full ROM all peripheral extremities, joint stability, 5/5 strength, and normal gait.  Skin: Warm, dry without exposed rashes, lesions or ecchymosis apparent.  Neuro:  Cranial nerves intact, reflexes equal bilaterally. Sensory-motor testing grossly intact. Tendon reflexes grossly intact.  Pysch: Alert & oriented x 3.  Insight and judgement nl & appropriate. No ideations.  Assessment and Plan:  1. Essential hypertension  - TSH  2. Hyperlipidemia  - Lipid panel  3. Prediabetes  - Hemoglobin A1c - Insulin, random  4. Vitamin D deficiency  - Vit D  25 hydroxy (rtn osteoporosis monitoring)  5. Hypothyroidism, unspecified hypothyroidism type   6. Chronic atrial fibrillation   7. Long term current use of anticoagulant therapy  - Protime-INR  8. Medication management  - CBC with Differential/Platelet - BASIC METABOLIC PANEL WITH GFR - Hepatic function panel - Magnesium  9. Sinusitis -   - Levaquin 500 mg #15 X 1rf & advised to cut coumadin dose in 1/2 while on Abx , ie 2 mg x 1/2 = 1 mg qdaily.    Recommended regular exercise, BP monitoring, weight control, and discussed med and SE's. Recommended labs to assess and monitor clinical status. Further disposition pending results of labs. Over 30 minutes of exam, counseling, chart review was performed

## 2014-07-11 LAB — CBC WITH DIFFERENTIAL/PLATELET
Basophils Absolute: 0 10*3/uL (ref 0.0–0.1)
Basophils Relative: 0 % (ref 0–1)
Eosinophils Absolute: 0.3 10*3/uL (ref 0.0–0.7)
Eosinophils Relative: 4 % (ref 0–5)
HCT: 45 % (ref 36.0–46.0)
HEMOGLOBIN: 15 g/dL (ref 12.0–15.0)
LYMPHS ABS: 1.6 10*3/uL (ref 0.7–4.0)
LYMPHS PCT: 22 % (ref 12–46)
MCH: 31.2 pg (ref 26.0–34.0)
MCHC: 33.3 g/dL (ref 30.0–36.0)
MCV: 93.6 fL (ref 78.0–100.0)
MONOS PCT: 7 % (ref 3–12)
MPV: 10.5 fL (ref 8.6–12.4)
Monocytes Absolute: 0.5 10*3/uL (ref 0.1–1.0)
Neutro Abs: 4.8 10*3/uL (ref 1.7–7.7)
Neutrophils Relative %: 67 % (ref 43–77)
PLATELETS: 239 10*3/uL (ref 150–400)
RBC: 4.81 MIL/uL (ref 3.87–5.11)
RDW: 14.3 % (ref 11.5–15.5)
WBC: 7.2 10*3/uL (ref 4.0–10.5)

## 2014-07-11 LAB — PROTIME-INR
INR: 3.82 — ABNORMAL HIGH (ref <1.50)
Prothrombin Time: 37.6 seconds — ABNORMAL HIGH (ref 11.6–15.2)

## 2014-07-11 LAB — HEPATIC FUNCTION PANEL
ALK PHOS: 30 U/L — AB (ref 39–117)
ALT: 14 U/L (ref 0–35)
AST: 17 U/L (ref 0–37)
Albumin: 4 g/dL (ref 3.5–5.2)
BILIRUBIN INDIRECT: 0.4 mg/dL (ref 0.2–1.2)
BILIRUBIN TOTAL: 0.5 mg/dL (ref 0.2–1.2)
Bilirubin, Direct: 0.1 mg/dL (ref 0.0–0.3)
Total Protein: 6.3 g/dL (ref 6.0–8.3)

## 2014-07-11 LAB — BASIC METABOLIC PANEL WITH GFR
BUN: 21 mg/dL (ref 6–23)
CO2: 22 mEq/L (ref 19–32)
Calcium: 9.2 mg/dL (ref 8.4–10.5)
Chloride: 99 mEq/L (ref 96–112)
Creat: 1.25 mg/dL — ABNORMAL HIGH (ref 0.50–1.10)
GFR, EST NON AFRICAN AMERICAN: 43 mL/min — AB
GFR, Est African American: 50 mL/min — ABNORMAL LOW
GLUCOSE: 85 mg/dL (ref 70–99)
POTASSIUM: 4.2 meq/L (ref 3.5–5.3)
SODIUM: 136 meq/L (ref 135–145)

## 2014-07-11 LAB — INSULIN, RANDOM: Insulin: 19.1 u[IU]/mL (ref 2.0–19.6)

## 2014-07-11 LAB — TSH: TSH: 9.876 u[IU]/mL — AB (ref 0.350–4.500)

## 2014-07-11 LAB — LIPID PANEL
CHOL/HDL RATIO: 5.2 ratio
CHOLESTEROL: 165 mg/dL (ref 0–200)
HDL: 32 mg/dL — ABNORMAL LOW (ref >=46)
LDL Cholesterol: 88 mg/dL (ref 0–99)
Triglycerides: 224 mg/dL — ABNORMAL HIGH (ref <150)
VLDL: 45 mg/dL — ABNORMAL HIGH (ref 0–40)

## 2014-07-11 LAB — HEMOGLOBIN A1C
Hgb A1c MFr Bld: 5.8 % — ABNORMAL HIGH (ref <5.7)
MEAN PLASMA GLUCOSE: 120 mg/dL — AB (ref <117)

## 2014-07-11 LAB — MAGNESIUM: Magnesium: 1.7 mg/dL (ref 1.5–2.5)

## 2014-07-11 LAB — VITAMIN D 25 HYDROXY (VIT D DEFICIENCY, FRACTURES): Vit D, 25-Hydroxy: 33 ng/mL (ref 30–100)

## 2014-07-17 ENCOUNTER — Other Ambulatory Visit: Payer: Self-pay | Admitting: Internal Medicine

## 2014-07-17 DIAGNOSIS — M25572 Pain in left ankle and joints of left foot: Secondary | ICD-10-CM | POA: Diagnosis not present

## 2014-07-17 DIAGNOSIS — M25562 Pain in left knee: Secondary | ICD-10-CM | POA: Diagnosis not present

## 2014-07-25 ENCOUNTER — Ambulatory Visit (INDEPENDENT_AMBULATORY_CARE_PROVIDER_SITE_OTHER): Payer: Medicare Other | Admitting: Internal Medicine

## 2014-07-25 VITALS — BP 110/60 | HR 62 | Temp 97.8°F | Resp 16 | Ht 63.5 in | Wt 202.0 lb

## 2014-07-25 DIAGNOSIS — R609 Edema, unspecified: Secondary | ICD-10-CM | POA: Diagnosis not present

## 2014-07-25 DIAGNOSIS — I509 Heart failure, unspecified: Secondary | ICD-10-CM

## 2014-07-25 DIAGNOSIS — R0600 Dyspnea, unspecified: Secondary | ICD-10-CM

## 2014-07-25 DIAGNOSIS — R600 Localized edema: Secondary | ICD-10-CM

## 2014-07-25 LAB — BASIC METABOLIC PANEL WITH GFR
BUN: 27 mg/dL — ABNORMAL HIGH (ref 6–23)
CO2: 23 mEq/L (ref 19–32)
CREATININE: 1.62 mg/dL — AB (ref 0.50–1.10)
Calcium: 9.7 mg/dL (ref 8.4–10.5)
Chloride: 100 mEq/L (ref 96–112)
GFR, EST NON AFRICAN AMERICAN: 31 mL/min — AB
GFR, Est African American: 36 mL/min — ABNORMAL LOW
Glucose, Bld: 80 mg/dL (ref 70–99)
POTASSIUM: 4.1 meq/L (ref 3.5–5.3)
Sodium: 138 mEq/L (ref 135–145)

## 2014-07-25 LAB — URIC ACID: Uric Acid, Serum: 5.4 mg/dL (ref 2.4–7.0)

## 2014-07-25 MED ORDER — PREDNISONE 20 MG PO TABS: ORAL_TABLET | ORAL | Status: DC

## 2014-07-25 NOTE — Patient Instructions (Addendum)
Increase lasix to 80 mg in the morning.  We will see you back in 1 week.  We will check for gout.    Lars Masson ?   Website  Directions  [facility]  Address: [address], [place], [place] [address]  [phone]  Hours:  Open today  8:30AM-9PM

## 2014-07-25 NOTE — Progress Notes (Signed)
   Subjective:    Patient ID: Katie [name], female    DOB: [birth year], 74 y.o.   MRN: [id number]  Foot Pain Associated symptoms include joint swelling. Pertinent negatives include no chest pain, chills, coughing, fatigue, fever or rash.  Patient presents to the office for evaluation of bilateral feet swelling which has been going on for the past several weeks.  She reports that on the 27th she saw Dr. Melford Aase who placed her on 80 mg lasix and that has helped a little bit but not much.  She does not report that she wear compression stockings.  She has been elevating them but they are not above her heart.  She reports that her feet have been aching and mildly red.  She does not feel like this is typical for her gout outbreak.  She reports that this is much different.  She reports that she is not urinating a lot after taking her fluid pills.  She reports that she is taking 40 mg lasix in the morning and 40 mg lasix in the afternoon.  She feels like it is not helping.      Review of Systems  Constitutional: Negative for fever, chills and fatigue.  Respiratory: Negative for cough, chest tightness and shortness of breath.   Cardiovascular: Positive for leg swelling. Negative for chest pain and palpitations.       No PND no Orthopnea  Musculoskeletal: Positive for joint swelling and gait problem.  Skin: Positive for color change (mildly red legs). Negative for rash.       Objective:   Physical Exam  Constitutional: She is oriented to person, place, and time. She appears well-developed and well-nourished.  HENT:  Head: Normocephalic and atraumatic.  Mouth/Throat: Oropharynx is clear and moist. No oropharyngeal exudate.  Eyes: Conjunctivae are normal. No scleral icterus.  Neck: Normal range of motion. Neck supple.  Cardiovascular: Normal rate, regular rhythm, normal heart sounds and intact distal pulses.  Exam reveals no gallop and no friction rub.   No murmur heard. Pulses:      Dorsalis  pedis pulses are 1+ on the right side, and 1+ on the left side.       Posterior tibial pulses are 1+ on the right side, and 1+ on the left side.  2+ pretibial edema bilateral with minimal tenderness to palpation. Leg diameters appear equal bilaterally.    Pulmonary/Chest: Effort normal and breath sounds normal. No respiratory distress. She has no wheezes. She has no rales. She exhibits no tenderness.  Abdominal: Soft. Bowel sounds are normal.  Musculoskeletal: Normal range of motion.  Neurological: She is alert and oriented to person, place, and time.  Skin: Skin is warm and dry.  Psychiatric: She has a normal mood and affect. Her behavior is normal. Judgment and thought content normal.  Nursing note and vitals reviewed.         Assessment & Plan:    1. Peripheral edema -elevate legs above heart -increase lasix to 80 mg in the morning.  If no improvement consider either maxide or bumex -compression stocking -see back in 1 week - Uric acid - Brain natriuretic peptide (0000000) - BASIC METABOLIC PANEL WITH GFR  2. Dyspnea -BNP  3. Congestive heart failure, unspecified congestive heart failure chronicity, unspecified congestive heart failure type -BNP

## 2014-07-26 LAB — BRAIN NATRIURETIC PEPTIDE: BRAIN NATRIURETIC PEPTIDE: 124.4 pg/mL — AB (ref 0.0–100.0)

## 2014-07-27 ENCOUNTER — Encounter: Payer: Self-pay | Admitting: *Deleted

## 2014-07-30 ENCOUNTER — Other Ambulatory Visit: Payer: Self-pay | Admitting: Internal Medicine

## 2014-08-01 ENCOUNTER — Ambulatory Visit (INDEPENDENT_AMBULATORY_CARE_PROVIDER_SITE_OTHER): Payer: Medicare Other | Admitting: Internal Medicine

## 2014-08-01 ENCOUNTER — Encounter: Payer: Self-pay | Admitting: Internal Medicine

## 2014-08-01 VITALS — BP 110/72 | HR 62 | Temp 98.4°F | Resp 16 | Ht 63.5 in | Wt 200.0 lb

## 2014-08-01 DIAGNOSIS — Z7901 Long term (current) use of anticoagulants: Secondary | ICD-10-CM | POA: Diagnosis not present

## 2014-08-01 DIAGNOSIS — R0981 Nasal congestion: Secondary | ICD-10-CM

## 2014-08-01 DIAGNOSIS — M25561 Pain in right knee: Secondary | ICD-10-CM | POA: Diagnosis not present

## 2014-08-01 DIAGNOSIS — Z79899 Other long term (current) drug therapy: Secondary | ICD-10-CM | POA: Diagnosis not present

## 2014-08-01 DIAGNOSIS — N289 Disorder of kidney and ureter, unspecified: Secondary | ICD-10-CM | POA: Diagnosis not present

## 2014-08-01 DIAGNOSIS — R609 Edema, unspecified: Secondary | ICD-10-CM

## 2014-08-01 DIAGNOSIS — R600 Localized edema: Secondary | ICD-10-CM

## 2014-08-01 LAB — BASIC METABOLIC PANEL
BUN: 26 mg/dL — AB (ref 6–23)
CHLORIDE: 100 meq/L (ref 96–112)
CO2: 26 meq/L (ref 19–32)
Calcium: 9.5 mg/dL (ref 8.4–10.5)
Creat: 1.26 mg/dL — ABNORMAL HIGH (ref 0.50–1.10)
GLUCOSE: 86 mg/dL (ref 70–99)
POTASSIUM: 4.5 meq/L (ref 3.5–5.3)
SODIUM: 138 meq/L (ref 135–145)

## 2014-08-01 LAB — PROTIME-INR
INR: 1.1 (ref <1.50)
Prothrombin Time: 14.2 seconds (ref 11.6–15.2)

## 2014-08-01 NOTE — Progress Notes (Signed)
Subjective:    Patient ID: Katie [name], female    DOB: 1940/04/27, 74 y.o.   MRN: [id number]  HPI  Patient presents to the office for evaluation of bilateral leg swelling which has been going on several weeks.  She reports that the swelling is considerably better than it was.  She reports that she did not increase the dose of her lasix and notes that was due to her decreased kidney function.  She reports that she has been drinking plenty of fluids and avoids salt.  She has been taking no OTC NSAIDS.  Only OTC medication is the mucinex.  She does take levaquin occasionally for chronic sinus infections which she believes is worse right now.  She has not taken the prednisone that was prescribed to her last week.  Patient also desires to have her coumadin level checked this week instead of next.  She has been on abx for the past 3 weeks.  She has not missed any doses.  She has not fallen nor hit her head.  She has not had any blood in her stool, urine, or nose bleeds.    She would like to start taking levaquin again as she thinks that this will help her sinuses.    Review of Systems  Constitutional: Positive for fatigue. Negative for fever and chills.  Cardiovascular: Positive for leg swelling.  Musculoskeletal: Positive for joint swelling and arthralgias. Negative for gait problem.  Skin: Positive for color change.       Objective:   Physical Exam  Constitutional: She is oriented to person, place, and time. She appears well-developed and well-nourished. No distress.  HENT:  Head: Normocephalic and atraumatic.  Nose: Mucosal edema present.  Mouth/Throat: Oropharynx is clear and moist. No oropharyngeal exudate.  Eyes: Conjunctivae are normal. No scleral icterus.  Neck: Normal range of motion. Neck supple. No JVD present. No thyromegaly present.  Cardiovascular: Normal rate, regular rhythm and intact distal pulses.  Exam reveals no gallop and no friction rub.   Murmur heard. 1+  PERIPHERAL EDEMA BILATERALLY, MILD RUBOR ON DEPENDENCY. Improved on foot elevation.  Pulmonary/Chest: Effort normal and breath sounds normal. No respiratory distress. She has no wheezes. She has no rales. She exhibits no tenderness.  Abdominal: Soft. Bowel sounds are normal. She exhibits no distension and no mass. There is no tenderness. There is no rebound and no guarding.  Musculoskeletal: Normal range of motion.  Left 1st MTP mildly tender to palpation.  Pain with flexion and extension of joint.  Lymphadenopathy:    She has no cervical adenopathy.  Neurological: She is alert and oriented to person, place, and time.  Skin: Skin is warm and dry. She is not diaphoretic.  Psychiatric: She has a normal mood and affect. Her behavior is normal. Judgment and thought content normal.  Nursing note and vitals reviewed.         Assessment & Plan:    1. Function kidney decreased -cont current dose of 40 mg in am and pm -no NSAIDs -hydrate -consider d/c ziac if kidney function not improved. - Basic metabolic panel  2. Encounter for long-term (current) use of medications  - Digoxin level  3. Long term current use of anticoagulant therapy -cont coumadin - Protime-INR  4.  Nasal congestion -completed a zpak and 14 day course of levaquin -don't think that this is bacterial infection -try dymista to see if this improves -patient wants to try levaquin again   5. Peripheral Edema -cont elevation -cont  lasix at 40 mg -wear compression stockings. -see ortho today

## 2014-08-01 NOTE — Patient Instructions (Signed)
Allergic Rhinitis Allergic rhinitis is when the mucous membranes in the nose respond to allergens. Allergens are particles in the air that cause your body to have an allergic reaction. This causes you to release allergic antibodies. Through a chain of events, these eventually cause you to release histamine into the blood stream. Although meant to protect the body, it is this release of histamine that causes your discomfort, such as frequent sneezing, congestion, and an itchy, runny nose.  CAUSES  Seasonal allergic rhinitis (hay fever) is caused by pollen allergens that may come from grasses, trees, and weeds. Year-round allergic rhinitis (perennial allergic rhinitis) is caused by allergens such as house dust mites, pet dander, and mold spores.  SYMPTOMS   Nasal stuffiness (congestion).  Itchy, runny nose with sneezing and tearing of the eyes. DIAGNOSIS  Your health care provider can help you determine the allergen or allergens that trigger your symptoms. If you and your health care provider are unable to determine the allergen, skin or blood testing may be used. TREATMENT  Allergic rhinitis does not have a cure, but it can be controlled by:  Medicines and allergy shots (immunotherapy).  Avoiding the allergen. Hay fever may often be treated with antihistamines in pill or nasal spray forms. Antihistamines block the effects of histamine. There are over-the-counter medicines that may help with nasal congestion and swelling around the eyes. Check with your health care provider before taking or giving this medicine.  If avoiding the allergen or the medicine prescribed do not work, there are many new medicines your health care provider can prescribe. Stronger medicine may be used if initial measures are ineffective. Desensitizing injections can be used if medicine and avoidance does not work. Desensitization is when a patient is given ongoing shots until the body becomes less sensitive to the allergen.  Make sure you follow up with your health care provider if problems continue. HOME CARE INSTRUCTIONS It is not possible to completely avoid allergens, but you can reduce your symptoms by taking steps to limit your exposure to them. It helps to know exactly what you are allergic to so that you can avoid your specific triggers. SEEK MEDICAL CARE IF:   You have a fever.  You develop a cough that does not stop easily (persistent).  You have shortness of breath.  You start wheezing.  Symptoms interfere with normal daily activities. Document Released: 11/24/2000 Document Revised: 03/06/2013 Document Reviewed: 11/06/2012 [facility] Patient Information 2015 [place], [place]. This information is not intended to replace advice given to you by your health care provider. Make sure you discuss any questions you have with your health care provider.  Peripheral Edema You have swelling in your legs (peripheral edema). This swelling is due to excess accumulation of salt and water in your body. Edema may be a sign of heart, kidney or liver disease, or a side effect of a medication. It may also be due to problems in the leg veins. Elevating your legs and using special support stockings may be very helpful, if the cause of the swelling is due to poor venous circulation. Avoid long periods of standing, whatever the cause. Treatment of edema depends on identifying the cause. Chips, pretzels, pickles and other salty foods should be avoided. Restricting salt in your diet is almost always needed. Water pills (diuretics) are often used to remove the excess salt and water from your body via urine. These medicines prevent the kidney from reabsorbing sodium. This increases urine flow. Diuretic treatment may also result in  lowering of potassium levels in your body. Potassium supplements may be needed if you have to use diuretics daily. Daily weights can help you keep track of your progress in clearing your edema. You should call  your caregiver for follow up care as recommended. SEEK IMMEDIATE MEDICAL CARE IF:  You have increased swelling, pain, redness, or heat in your legs. You develop shortness of breath, especially when lying down. You develop chest or abdominal pain, weakness, or fainting. You have a fever. Document Released: 04/08/2004 Document Revised: 05/24/2011 Document Reviewed: 03/19/2009 [facility] Patient Information 2015 [place], [place]. This information is not intended to replace advice given to you by your health care provider. Make sure you discuss any questions you have with your health care provider.

## 2014-08-02 LAB — DIGOXIN LEVEL: Digoxin Level: 1.3 ng/mL (ref 0.8–2.0)

## 2014-08-08 ENCOUNTER — Ambulatory Visit: Payer: Self-pay | Admitting: Internal Medicine

## 2014-08-09 ENCOUNTER — Ambulatory Visit: Payer: Self-pay | Admitting: Internal Medicine

## 2014-08-21 DIAGNOSIS — H10413 Chronic giant papillary conjunctivitis, bilateral: Secondary | ICD-10-CM | POA: Diagnosis not present

## 2014-08-21 DIAGNOSIS — H0015 Chalazion left lower eyelid: Secondary | ICD-10-CM | POA: Diagnosis not present

## 2014-08-21 DIAGNOSIS — H10503 Unspecified blepharoconjunctivitis, bilateral: Secondary | ICD-10-CM | POA: Diagnosis not present

## 2014-08-21 DIAGNOSIS — Z961 Presence of intraocular lens: Secondary | ICD-10-CM | POA: Diagnosis not present

## 2014-08-26 ENCOUNTER — Telehealth: Payer: Self-pay | Admitting: *Deleted

## 2014-08-26 ENCOUNTER — Other Ambulatory Visit: Payer: Self-pay | Admitting: Internal Medicine

## 2014-08-26 DIAGNOSIS — H0015 Chalazion left lower eyelid: Secondary | ICD-10-CM | POA: Diagnosis not present

## 2014-08-26 DIAGNOSIS — H00035 Abscess of left lower eyelid: Secondary | ICD-10-CM | POA: Diagnosis not present

## 2014-08-26 NOTE — Telephone Encounter (Signed)
Patient called and left detailed message stating her eye doctor gave her azithromycin abx and wanted Dr. Melford Aase to advise on Coumadin dose while taking abx.  Per Dr. Melford Aase, patient was advised to cut current dose of Coumadin in 1/2 only while taking Abx and patient will f/u for regularly scheduled PT/INR recheck.  Patient states she is currently taking 1 mg daily, will decrease to 0.5 mg daily.

## 2014-08-27 ENCOUNTER — Telehealth: Payer: Self-pay | Admitting: *Deleted

## 2014-08-27 NOTE — Telephone Encounter (Signed)
Larene Beach from Dr Patrice Paradise called and asked how long prior to a removal of sty should the patient stop her Coumadin.  Per Dr Reino Bellis, stop the med 3 days prior.

## 2014-09-02 DIAGNOSIS — H01025 Squamous blepharitis left lower eyelid: Secondary | ICD-10-CM | POA: Diagnosis not present

## 2014-09-02 DIAGNOSIS — H0015 Chalazion left lower eyelid: Secondary | ICD-10-CM | POA: Diagnosis not present

## 2014-09-04 ENCOUNTER — Ambulatory Visit: Payer: Self-pay | Admitting: Internal Medicine

## 2014-09-04 ENCOUNTER — Ambulatory Visit: Payer: Self-pay

## 2014-09-04 ENCOUNTER — Encounter: Payer: Self-pay | Admitting: Internal Medicine

## 2014-09-04 ENCOUNTER — Ambulatory Visit (INDEPENDENT_AMBULATORY_CARE_PROVIDER_SITE_OTHER): Payer: Medicare Other | Admitting: Internal Medicine

## 2014-09-04 VITALS — BP 110/68 | HR 76 | Temp 97.7°F | Resp 16 | Ht 63.5 in | Wt 197.6 lb

## 2014-09-04 DIAGNOSIS — Z5181 Encounter for therapeutic drug level monitoring: Secondary | ICD-10-CM | POA: Diagnosis not present

## 2014-09-04 DIAGNOSIS — I482 Chronic atrial fibrillation, unspecified: Secondary | ICD-10-CM

## 2014-09-04 DIAGNOSIS — H109 Unspecified conjunctivitis: Secondary | ICD-10-CM

## 2014-09-04 DIAGNOSIS — L22 Diaper dermatitis: Secondary | ICD-10-CM

## 2014-09-04 DIAGNOSIS — N63 Unspecified lump in breast: Secondary | ICD-10-CM | POA: Diagnosis not present

## 2014-09-04 DIAGNOSIS — Z7901 Long term (current) use of anticoagulants: Secondary | ICD-10-CM

## 2014-09-04 DIAGNOSIS — B372 Candidiasis of skin and nail: Secondary | ICD-10-CM

## 2014-09-04 DIAGNOSIS — I10 Essential (primary) hypertension: Secondary | ICD-10-CM

## 2014-09-04 DIAGNOSIS — Z79899 Other long term (current) drug therapy: Secondary | ICD-10-CM | POA: Diagnosis not present

## 2014-09-04 MED ORDER — NEOMYCIN-POLYMYXIN-DEXAMETH 3.5-10000-0.1 OP SUSP: OPHTHALMIC | Status: DC

## 2014-09-04 MED ORDER — FLUCONAZOLE 150 MG PO TABS: ORAL_TABLET | ORAL | Status: DC

## 2014-09-04 NOTE — Progress Notes (Signed)
Subjective:    Patient ID: Katie [name], female    DOB: 12-08-1940, 74 y.o.   MRN: [id number]  HPI  Patient presents for Coag recheck and in fact has been off of her coumadin x 4 days as she had I&D of a stye of her left lower eye lid 4 days ago. Patient ha completed 10 of 30 days of azithromycin for ocular per her eye Dr.  Marisa Sprinkles BP has been stable and she's had no CV complaints. No c/o CP, palpitations, dyspnea or edema.   Medication Sig  . allopurinol  300 MG tablet TAKE 1 TABLET BY MOUTH DAILY  . aspirin 81 MG tablet Take 81 mg by mouth daily.    . benazepril  20 MG tablet Take 20 mg by mouth daily.  . bisoprolol-hctz (ZIAC) 5-6.25 MG per tablet Take 1 tablet by mouth daily.  Marland Kitchen dexamethasone (DECADRON) 0.1 % ophthalmic suspension Place 1 drop into both eyes daily.  . digoxin  0.25 MG tablet TAKE 1 TABLET BY MOUTH DAILY  . furosemide  80 MG tablet TAKE 1 TABLET BY MOUTH TWICE A DAY AS NEEDED FOR FLUID  . gabapentin 300 MG Take 1 capsule (300 mg total) by mouth 3 (three) times daily.  . ATROVENT0.02 % nebulizer solution Take 0.5 mg by nebulization every 4 (four) hours as needed for wheezing or shortness of breath.   . DUONEB).5-2.5 (3) MG/3ML Use 3 cc  4 x day or every 4 hours if needed for shortness of breath.  . levothyroxine  50 MCG tablet TAKE 1 TABLET EVERY DAY   . montelukast  10 MG tablet Take 1 tablet (10 mg total) by mouth daily as needed. For allergy  . pravastatin 40 MG tablet Take 40 mg by mouth at bedtime.    . prochlorperazine  5 MG  TAKE 1 TABLET BY MOUTH 3 TIMES A DAY FOR VERTIGO OR NAUSEA  . terconazole (TERAZOL 7) 0.4 % vag crm Place 1 applicator vaginally at bedtime as needed (infection).  . SPIRIVA RESPIMAT 2.5 MCG/ Use 1 to 2 puff once daily (Patient taking differently: Inhale 1 to 2 puffs into the lungs once daily)  . warfarin (COUMADIN) 2 MG tablet TAKE 1 TO 2 TABLETS BY MOUTH DAILY OR AS DIRECTED  . fluconazole (DIFLUCAN) 150 MG tablet Take 1 tablet (150 mg total)  by mouth daily as needed (infection).  Marland Kitchen albuterol  HFA   inhaler 2 puffs 5 minutes apart every 4 to 6 hours as needed to rescue asthma   . furosemide (LASIX) 80 MG tablet Take 40-80 mg by mouth daily.   Allergies  Allergen Reactions  . Advair Diskus [Fluticasone-Salmeterol] Other (See Comments)    SKIN CHANGES.  Can tolerate low dose of prednisone without complications  . Amiodarone Other (See Comments)    PULMONARY TOXICITY  . Codeine     unknown  . Diovan [Valsartan] Other (See Comments)    HYPOTENSION  . Doxycycline Diarrhea and Other (See Comments)    VISUAL DISTURBANCE  . Flexeril [Cyclobenzaprine] Other (See Comments)    FATIGUE  . Keflex [Cephalexin] Diarrhea  . Verapamil Other (See Comments)    EDEMA   Past Medical History  Diagnosis Date  . Atrial fibrillation   . Bronchitis, chronic   . History of Bell's palsy   . Coronary artery disease   . Migraine headache   . Atrial flutter   . Tobacco abuse   . Osteopenia   . Cervical dysplasia   .  Thyroid disease     Hypo  . Unspecified vitamin D deficiency   . Family history of anesthesia complication     Hard for daughter to wake up after Anesthesia  . Shortness of breath     with exertion  . Cystocele     either on bladder or kidneys patient is unsure but stated physician will watch it  . Seasonal allergies   . Dizziness     If patient gets out of bed too fast  . H/O hiatal hernia   . Rosacea conjunctivitis     Right eye is worse than left eye  . Arthritis   . Gout   . Aortic stenosis     mild AS witm mild to mod AR by 11/2012 echo   Past Surgical History  Procedure Laterality Date  . Broken leg Left 1970s  . Laser vein procedure    . Refractive surgery    . Colonoscopy    . Cholecystectomy  1972  . Tonsillectomy    . Eye surgery Bilateral   . Cardiac catheterization    . Abdominal aortic endovascular stent graft N/A 04/11/2013    Procedure: ABDOMINAL AORTIC ENDOVASCULAR STENT GRAFT WITH RIGHT FEMORAL  PATCH ANGIOPLASTY;  Surgeon: Mal Misty, MD;  Location: [facility];  Service: Vascular;  Laterality: N/A;  . Breast surgery      LEFT BREAST BIOPSY   Review of Systems In addition to the HPI above,  No Fever-chills,  No Headache, No changes with Vision or hearing,  No problems swallowing food or Liquids,  No Chest pain or productive Cough or Shortness of Breath,  No Abdominal pain, No Nausea or Vomitting, Bowel movements are regular,  No Blood in stool or Urine,  No dysuria,  No new skin rashes or bruises,  No new joints pains-aches,  No new weakness, tingling, numbness in any extremity,  No recent weight loss,  No polyuria, polydypsia or polyphagia,  No significant Mental Stressors.  A full 10 point Review of Systems was done, except as stated above, all other Review of Systems were negative    Objective:   Physical Exam   BP 110/68 mmHg  Pulse 76  Temp(Src) 97.7 F (36.5 C)  Resp 16  Ht 5' 3.5" (1.613 m)  Wt 197 lb 9.6 oz (89.631 kg)  BMI 34.45 kg/m2  HEENT - Eac's patent. TM's Nl. EOM's full. PERRLA. NasoOroPharynx clear.Healing wound of left lower internal eyelid.No signs of orbital/peri-orbital cellulitis, but conjunctiva is injected. .   Neck - supple. Nl Thyroid. Carotids 2+ & No bruits, nodes, JVD Chest - Clear equal BS w/o Rales, rhonchi, wheezes. Cor - Nl HS. RRR w/o sig MGR. PP 1(+). No edema. Abd - No palpable organomegaly, masses or tenderness. BS nl. MS- FROM w/o deformities. Muscle power, tone and bulk Nl. Gait Nl. Neuro - No obvious Cr N abnormalities. Sensory, motor and Cerebellar functions appear Nl w/o focal abnormalities. Psyche - Mental status normal & appropriate.  No delusions, ideations or obvious mood abnormalities.    Assessment & Plan:   1. Essential hypertension   2. Chronic atrial fibrillation   3. Long term current use of anticoagulant therapy  - Protime-INR  4. Encounter for monitoring digoxin therapy  - Digoxin level  5. Vaginal  candidiasis  - fluconazole (DIFLUCAN) 150 MG tablet; Take 1 tablet 2 x week  For yeast infection  Dispense: 8 tablet; Refill: 2  6. Conjunctivitis, unspecified laterality  - neomycin-polymyxin b-dexamethasone (MAXITROL) 3.5-10000-0.1 SUSP;  Take 1 to 2 drops to affected eye 4 x daily  Dispense: 5 mL; Refill: 1

## 2014-09-05 LAB — PROTIME-INR
INR: 1.13 (ref <1.50)
Prothrombin Time: 14.5 seconds (ref 11.6–15.2)

## 2014-09-05 LAB — DIGOXIN LEVEL: DIGOXIN LVL: 0.9 ug/L (ref 0.8–2.0)

## 2014-09-09 ENCOUNTER — Other Ambulatory Visit: Payer: Self-pay | Admitting: Emergency Medicine

## 2014-09-18 DIAGNOSIS — H01025 Squamous blepharitis left lower eyelid: Secondary | ICD-10-CM | POA: Diagnosis not present

## 2014-09-18 DIAGNOSIS — H01024 Squamous blepharitis left upper eyelid: Secondary | ICD-10-CM | POA: Diagnosis not present

## 2014-09-18 DIAGNOSIS — H01022 Squamous blepharitis right lower eyelid: Secondary | ICD-10-CM | POA: Diagnosis not present

## 2014-09-18 DIAGNOSIS — Z961 Presence of intraocular lens: Secondary | ICD-10-CM | POA: Diagnosis not present

## 2014-09-18 DIAGNOSIS — H01021 Squamous blepharitis right upper eyelid: Secondary | ICD-10-CM | POA: Diagnosis not present

## 2014-09-27 ENCOUNTER — Ambulatory Visit (INDEPENDENT_AMBULATORY_CARE_PROVIDER_SITE_OTHER): Payer: Medicare Other

## 2014-09-27 DIAGNOSIS — Z7901 Long term (current) use of anticoagulants: Secondary | ICD-10-CM

## 2014-09-27 DIAGNOSIS — Z5181 Encounter for therapeutic drug level monitoring: Secondary | ICD-10-CM | POA: Diagnosis not present

## 2014-09-27 DIAGNOSIS — M25572 Pain in left ankle and joints of left foot: Secondary | ICD-10-CM | POA: Diagnosis not present

## 2014-09-27 LAB — PROTIME-INR
INR: 1.1 (ref <1.50)
PROTHROMBIN TIME: 14.2 s (ref 11.6–15.2)

## 2014-09-27 NOTE — Progress Notes (Signed)
Patient ID: Katie [name], female   DOB: 13-Jun-1940, 74 y.o.   MRN: [id number] Patient presents for 2 week recheck PT/INR. Patient states she is taking Coumadin 2mg  1/4 tablet daily.

## 2014-10-06 ENCOUNTER — Other Ambulatory Visit: Payer: Self-pay | Admitting: Internal Medicine

## 2014-10-08 ENCOUNTER — Ambulatory Visit (INDEPENDENT_AMBULATORY_CARE_PROVIDER_SITE_OTHER): Payer: Medicare Other | Admitting: *Deleted

## 2014-10-08 ENCOUNTER — Other Ambulatory Visit: Payer: Self-pay | Admitting: Internal Medicine

## 2014-10-08 DIAGNOSIS — Z7901 Long term (current) use of anticoagulants: Secondary | ICD-10-CM

## 2014-10-08 LAB — PROTIME-INR
INR: 1.35 (ref <1.50)
Prothrombin Time: 16.7 seconds — ABNORMAL HIGH (ref 11.6–15.2)

## 2014-10-08 NOTE — Progress Notes (Signed)
Patient ID: Katie [name], female   DOB: 1940-10-23, 74 y.o.   MRN: [id number] Patient presents for recheck PT/INR.  States she currently takes Coumadin 1 mg daily.

## 2014-10-16 ENCOUNTER — Ambulatory Visit (INDEPENDENT_AMBULATORY_CARE_PROVIDER_SITE_OTHER): Payer: Medicare Other | Admitting: *Deleted

## 2014-10-16 DIAGNOSIS — Z7901 Long term (current) use of anticoagulants: Secondary | ICD-10-CM

## 2014-10-16 LAB — PROTIME-INR
INR: 2.06 — ABNORMAL HIGH (ref <1.50)
PROTHROMBIN TIME: 23.2 s — AB (ref 11.6–15.2)

## 2014-10-16 NOTE — Progress Notes (Signed)
Patient ID: Katie [name], female   DOB: 1940/03/26, 74 y.o.   MRN: [id number] Patient presents for recheck PT/INR per Dr. Idell Pickles orders.  Patient currently taking Coumadin 2 mg daily.

## 2014-10-17 ENCOUNTER — Ambulatory Visit: Payer: Self-pay

## 2014-10-24 DIAGNOSIS — M170 Bilateral primary osteoarthritis of knee: Secondary | ICD-10-CM | POA: Diagnosis not present

## 2014-10-24 DIAGNOSIS — M19072 Primary osteoarthritis, left ankle and foot: Secondary | ICD-10-CM | POA: Diagnosis not present

## 2014-11-13 ENCOUNTER — Ambulatory Visit (INDEPENDENT_AMBULATORY_CARE_PROVIDER_SITE_OTHER): Payer: Medicare Other | Admitting: Internal Medicine

## 2014-11-13 ENCOUNTER — Encounter: Payer: Self-pay | Admitting: Internal Medicine

## 2014-11-13 VITALS — BP 138/72 | HR 64 | Temp 98.2°F | Resp 18 | Ht 63.5 in | Wt 197.0 lb

## 2014-11-13 DIAGNOSIS — I4891 Unspecified atrial fibrillation: Secondary | ICD-10-CM | POA: Diagnosis not present

## 2014-11-13 DIAGNOSIS — J449 Chronic obstructive pulmonary disease, unspecified: Secondary | ICD-10-CM | POA: Diagnosis not present

## 2014-11-13 DIAGNOSIS — E559 Vitamin D deficiency, unspecified: Secondary | ICD-10-CM | POA: Diagnosis not present

## 2014-11-13 DIAGNOSIS — Z1389 Encounter for screening for other disorder: Secondary | ICD-10-CM

## 2014-11-13 DIAGNOSIS — R7303 Prediabetes: Secondary | ICD-10-CM

## 2014-11-13 DIAGNOSIS — Z6834 Body mass index (BMI) 34.0-34.9, adult: Secondary | ICD-10-CM

## 2014-11-13 DIAGNOSIS — I5020 Unspecified systolic (congestive) heart failure: Secondary | ICD-10-CM | POA: Diagnosis not present

## 2014-11-13 DIAGNOSIS — Z9181 History of falling: Secondary | ICD-10-CM

## 2014-11-13 DIAGNOSIS — Z1331 Encounter for screening for depression: Secondary | ICD-10-CM

## 2014-11-13 DIAGNOSIS — I2510 Atherosclerotic heart disease of native coronary artery without angina pectoris: Secondary | ICD-10-CM | POA: Diagnosis not present

## 2014-11-13 DIAGNOSIS — R7309 Other abnormal glucose: Secondary | ICD-10-CM

## 2014-11-13 DIAGNOSIS — I739 Peripheral vascular disease, unspecified: Secondary | ICD-10-CM

## 2014-11-13 DIAGNOSIS — E782 Mixed hyperlipidemia: Secondary | ICD-10-CM

## 2014-11-13 DIAGNOSIS — M1000 Idiopathic gout, unspecified site: Secondary | ICD-10-CM

## 2014-11-13 DIAGNOSIS — I714 Abdominal aortic aneurysm, without rupture, unspecified: Secondary | ICD-10-CM

## 2014-11-13 DIAGNOSIS — Z789 Other specified health status: Secondary | ICD-10-CM | POA: Diagnosis not present

## 2014-11-13 DIAGNOSIS — I10 Essential (primary) hypertension: Secondary | ICD-10-CM | POA: Diagnosis not present

## 2014-11-13 DIAGNOSIS — J4489 Other specified chronic obstructive pulmonary disease: Secondary | ICD-10-CM

## 2014-11-13 DIAGNOSIS — K219 Gastro-esophageal reflux disease without esophagitis: Secondary | ICD-10-CM

## 2014-11-13 DIAGNOSIS — E039 Hypothyroidism, unspecified: Secondary | ICD-10-CM | POA: Diagnosis not present

## 2014-11-13 DIAGNOSIS — N183 Chronic kidney disease, stage 3 unspecified: Secondary | ICD-10-CM

## 2014-11-13 DIAGNOSIS — Z1212 Encounter for screening for malignant neoplasm of rectum: Secondary | ICD-10-CM

## 2014-11-13 DIAGNOSIS — Z7901 Long term (current) use of anticoagulants: Secondary | ICD-10-CM

## 2014-11-13 DIAGNOSIS — Z79899 Other long term (current) drug therapy: Secondary | ICD-10-CM

## 2014-11-13 NOTE — Progress Notes (Signed)
Patient ID: Katie [name], female   DOB: [birth year], 74 y.o.   MRN: [id number]   Comprehensive Examination  This very nice 74 y.o. MWF presents for complete physical.  Patient has been followed for HTN, T2_NIDDM  Prediabetes, Hyperlipidemia, and Vitamin D Deficiency. 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     HTN predates since 1999.  In 2005 she was dx/d with Afib & a non-ischemic cardiomyopathy.  Lexiscan in July 2014 showed EF 50%. Patient also has ASPVD and in Jan 2015 she had an AAA endovascular stent graft placed. Patient's BP has been controlled at home and patient denies any cardiac symptoms as chest pain, palpitations, shortness of breath, dizziness or ankle swelling. Today's BP: 138/72 mmHg    Patient's hyperlipidemia is controlled with diet and medications. Patient denies myalgias or other medication SE's. Last lipids were at goal -  Cholesterol 165; HDL 32*; LDL 88; ands elevated Triglycerides 224 on 07/10/2014.   Patient has prediabetes predating since  2013 with A1c 5.7% and in 2014 A1c was 5.6% with elevated insulin 47 showing insulin resistance.  Patient denies reactive hypoglycemic symptoms, visual blurring, diabetic polys or paresthesias. Last A1c was 5.8% on 07/10/2014.   Finally, patient has history of Vitamin D Deficiency of 32 in 2008 and last Vitamin D was 33 on 07/10/2014.       Medication Sig  . albuterol  HFA inhaler Inhale 2 puffs into the lungs 5 minutes apart every 4 to 6 hours as needed to rescue asthma  . allopurinol  300 MG tablet TAKE 1 TABLET BY MOUTH DAILY  . aspirin 81 MG tablet Take 81 mg by mouth daily.    . benazepril  20 MG tablet Take 20 mg by mouth daily.  . bisoprolol-hctz [facility]) 5-6.25  Take 1 tablet by mouth daily.  Marland Kitchen dexamethasone 0.1 % ophthalmic susp Place 1 drop into both eyes daily.  . digoxin  0.25 MG tablet TAKE 1 TABLET BY MOUTH DAILY  .  fluconazole  150 MG tablet Take 1 tablet 2 x week  For yeast infection  . furosemide  80 MG tablet TAKE 1 TABLET BY MOUTH TWICE A DAY AS NEEDED FOR FLUID  . gabapentin  300 MG capsule Take 1 capsule (300 mg total) by mouth 3 (three) times daily.  Marland Kitchen ipratropium-albuterol (DUONEB)  Use 3 cc  4 x day or every 4 hours if needed for shortness of breath.  . levothyroxine  50 MCG tablet TAKE 1 TABLET BY MOUTH EVERY DAY)  . montelukast  10 MG tablet Take 1 tablet (10 mg total) by mouth daily as needed. For allergy  . pravastatin  40 MG tablet Take 40 mg by mouth at bedtime.    . COMPAZINE 5 MG tablet TAKE 1 TABLET BY MOUTH 3 TIMES A DAY FOR VERTIGO OR NAUSEA  . warfarin (COUMADIN) 2 MG tablet TAKE 1 TO 2 TABLETS BY MOUTH DAILY OR AS DIRECTED  . TERAZOL 7    0.4 % vaginal cream Place 1 applicator vaginally at bedtime as needed (infection).    Allergies  Allergen Reactions  . Advair Diskus [Fluticasone-Salmeterol] Other (See Comments)    SKIN CHANGES.  Can tolerate low dose of prednisone without complications  . Amiodarone Other (See Comments)    PULMONARY TOXICITY  . Codeine     unknown  . Diovan [Valsartan] Other (See Comments)    HYPOTENSION  . Doxycycline  Diarrhea and Other (See Comments)    VISUAL DISTURBANCE  . Flexeril [Cyclobenzaprine] Other (See Comments)    FATIGUE  . Keflex [Cephalexin] Diarrhea  . Verapamil Other (See Comments)    EDEMA   Past Medical History  Diagnosis Date  . Atrial fibrillation   . Bronchitis, chronic   . History of Bell's palsy   . Coronary artery disease   . Migraine headache   . Atrial flutter   . Tobacco abuse   . Osteopenia   . Cervical dysplasia   . Thyroid disease     Hypo  . Unspecified vitamin D deficiency   . Family history of anesthesia complication     Hard for daughter to wake up after Anesthesia  . Shortness of breath     with exertion  . Cystocele     either on bladder or kidneys patient is unsure but stated physician will watch it   . Seasonal allergies   . Dizziness     If patient gets out of bed too fast  . H/O hiatal hernia   . Rosacea conjunctivitis     Right eye is worse than left eye  . Arthritis   . Gout   . Aortic stenosis     mild AS witm mild to mod AR by 11/2012 echo   Health Maintenance  Topic Date Due  . INFLUENZA VACCINE  10/14/2014  . MAMMOGRAM  02/27/2016  . TETANUS/TDAP  03/15/2017  . COLONOSCOPY  03/15/2022  . DEXA SCAN  Completed  . ZOSTAVAX  Completed  . PNA vac Low Risk Adult  Completed   Immunization History  Administered Date(s) Administered  . Influenza, High Dose Seasonal PF 12/20/2013  . Influenza-Unspecified 01/02/2013  . Pneumococcal Conjugate-13 01/23/2014  . Pneumococcal-Unspecified 03/15/1993, 05/31/2008  . Td 03/16/2007  . Varicella 02/19/2008   Past Surgical History  Procedure Laterality Date  . Broken leg Left 1970s  . Laser vein procedure    . Refractive surgery    . Colonoscopy    . Cholecystectomy  1972  . Tonsillectomy    . Eye surgery Bilateral   . Cardiac catheterization    . Abdominal aortic endovascular stent graft N/A 04/11/2013    Procedure: ABDOMINAL AORTIC ENDOVASCULAR STENT GRAFT WITH RIGHT FEMORAL PATCH ANGIOPLASTY;  Surgeon: Mal Misty, MD;  Location: [facility] OR;  Service: Vascular;  Laterality: N/A;  . Breast surgery      LEFT BREAST BIOPSY   Family History  Problem Relation Age of Onset  . Cirrhosis Mother   . Cancer Mother 37    PANCREAS  . Heart defect Sister   . Breast cancer Sister     age 93  . Heart disease Sister   . Stroke Sister   . Alcohol abuse Father   . Depression Father   . Hypertension Brother   . Hyperlipidemia Son   . Heart disease Daughter    Social History  Substance Use Topics  . Smoking status: Former Smoker    Types: Cigarettes    Quit date: 08/10/2002  . Smokeless tobacco: Never Used     Comment: History of tobacco abuse  . Alcohol Use: 0.0 oz/week     Comment: rare    ROS Constitutional: Denies fever,  chills, weight loss/gain, headaches, insomnia,  night sweats, and change in appetite. Does c/o fatigue. Eyes: Denies redness, blurred vision, diplopia, discharge, itchy, watery eyes.  ENT: Denies discharge, congestion, post nasal drip, epistaxis, sore throat, earache, hearing loss, dental pain, Tinnitus,  Vertigo, Sinus pain, snoring.  Cardio: Denies chest pain, palpitations, irregular heartbeat, syncope, dyspnea, diaphoresis, orthopnea, PND, claudication, edema Respiratory: denies cough, dyspnea, DOE, pleurisy, hoarseness, laryngitis, wheezing.  Gastrointestinal: Denies dysphagia, heartburn, reflux, water brash, pain, cramps, nausea, vomiting, bloating, diarrhea, constipation, hematemesis, melena, hematochezia, jaundice, hemorrhoids Genitourinary: Denies dysuria, frequency, urgency, nocturia, hesitancy, discharge, hematuria, flank pain Breast: Breast lumps, nipple discharge, bleeding.  Musculoskeletal: Denies arthralgia, myalgia, stiffness, Jt. Swelling, pain, limp, and strain/sprain. Denies falls. Skin: Denies puritis, rash, hives, warts, acne, eczema, changing in skin lesion Neuro: No weakness, tremor, incoordination, spasms, paresthesia, pain Psychiatric: Denies confusion, memory loss, sensory loss. Denies Depression. Endocrine: Denies change in weight, skin, hair change, nocturia, and paresthesia, diabetic polys, visual blurring, hyper / hypo glycemic episodes.  Heme/Lymph: No excessive bleeding, bruising, enlarged lymph nodes.  Physical Exam  BP 138/72   Pulse 64  Temp 98.2 F (36.8 C) Resp 18  Ht 5' 3.5"   Wt 197 lb     BMI 34.35   General Appearance: Well nourished and in no apparent distress. Eyes: PERRLA, EOMs, conjunctiva no swelling or erythema, normal fundi and vessels. Sinuses: No frontal/maxillary tenderness ENT/Mouth: EACs patent / TMs  nl. Nares clear without erythema, swelling, mucoid exudates. Oral hygiene is good. No erythema, swelling, or exudate. Tongue normal,  non-obstructing. Tonsils not swollen or erythematous. Hearing normal.  Neck: Supple, thyroid normal. No bruits, nodes or JVD. Respiratory: Respiratory effort normal.  BS equal and clear bilateral without rales, rhonci, wheezing or stridor. Cardio: Heart sounds are soft with irregular rate and rhythm and gr 2/4 sys murmur. Peripheral pulses are normal and equal bilaterally with 1(+)  edema. No aortic or femoral bruits. Chest: symmetric with normal excursions and percussion. Breasts: Symmetric, without lumps, nipple discharge, retractions, or fibrocystic changes.  Abdomen: Flat, soft, with bowel sounds. Nontender, no guarding, rebound, hernias, masses, or organomegaly.  Lymphatics: Non tender without lymphadenopathy.  Genitourinary:  Musculoskeletal: Full ROM all peripheral extremities, joint stability, 5/5 strength, and normal gait. Skin: Warm and dry without rashes, lesions, cyanosis, clubbing or  ecchymosis.  Neuro: Cranial nerves intact, reflexes equal bilaterally. Normal muscle tone, no cerebellar symptoms. Sensation intact.  Pysch: Awake and oriented X 3, normal affect, Insight and Judgment appropriate.   Assessment and Plan  1. Essential hypertension  - Microalbumin / creatinine urine ratio - EKG 12-Lead - TSH  2. Hyperlipidemia  - Lipid panel  3. Prediabetes  - Hemoglobin A1c - Insulin, random  4. Vitamin D deficiency  - Vit D  25 hydroxy   5. Hypothyroidism, unspecified hypothyroidism type  - TSH  6. CKD  stage III (GFR 51 ml/min)   7. Gastroesophageal reflux disease   8. COPD (chronic obstructive pulmonary disease) with chronic bronchitis   9. Atherosclerosis of native coronary artery of native heart without angina pectoris   10. Atrial fibrillation   11. Systolic congestive heart failure, unspecified congestive heart failure chronicity   12. AAA (abdominal aortic aneurysm)   13. PVD (peripheral vascular disease) with claudication   14.  Idiopathic gout  - Uric acid  15. Morbid obesity (BMI 34+)    16. Screening for rectal cancer  - POC Hemoccult Bld/Stl   17. Long term current use of anticoagulant therapy  - Protime-INR  18. Medication management  - Urine Microscopic - CBC with Differential/Platelet - BASIC METABOLIC PANEL WITH GFR - Hepatic function panel - Magnesium  19. BMI 34.0-34.9,adult   Continue prudent diet as discussed, weight control, BP monitoring, regular exercise, and  medications. Discussed med's effects and SE's. Screening labs and tests as requested with regular follow-up as recommended.  Over 40 minutes of exam, counseling, chart review was performed.

## 2014-11-13 NOTE — Progress Notes (Signed)
Patient ID: Katie [name], female   DOB: April 21, 1940, 74 y.o.   MRN: [id number]   Comprehensive Examination  This very nice 74 y.o.female presents for complete physical.  Patient has been followed for HTN, T2_NIDDM  Prediabetes, Hyperlipidemia, and Vitamin D Deficiency.    HTN predates since     . Patient's BP has been controlled at home and patient denies any cardiac symptoms as chest pain, palpitations, shortness of breath, dizziness or ankle swelling. Today's BP: 138/72 mmHg    Patient's hyperlipidemia is controlled with diet and medications. Patient denies myalgias or other medication SE's. Last lipids were 07/10/2014: Cholesterol 165; HDL 32*; LDL Cholesterol 88; Triglycerides 224*   Patient has T2_NIDDM prediabetes predating since      and patient denies reactive hypoglycemic symptoms, visual blurring, diabetic polys, or paresthesias. Last A1c was    07/10/2014: Hgb A1c MFr Bld 5.8*   Finally, patient has history of Vitamin D Deficiency and last Vitamin D was 07/10/2014: Vit D, 25-Hydroxy 33         Medication Sig  . albuterol  HFA inhaler Inhale 2 puffs into the lungs 5 minutes apart every 4 to 6 hours as needed to rescue asthma  . allopurinol  300 MG tablet TAKE 1 TABLET BY MOUTH DAILY  . aspirin 81 MG tablet Take 81 mg by mouth daily.    . benazepril (LOTENSIN) 20 MG tablet Take 20 mg by mouth daily.  . bisoprolol-hctz (ZIAC) 5-6.25 MG per tablet Take 1 tablet by mouth daily.  Marland Kitchen dexamethasone  0.1 % ophth susp Place 1 drop into both eyes daily.  . digoxin (LANOXIN) 0.25 MG tablet TAKE 1 TABLET BY MOUTH DAILY  . fluconazole (DIFLUCAN) 150 MG tablet Take 1 tablet 2 x week  For yeast infection  . furosemide (LASIX) 80 MG tablet TAKE 1 TABLET BY MOUTH TWICE A DAY AS NEEDED FOR FLUID  . gabapentin (NEURONTIN) 300 MG capsule Take 1 capsule (300 mg total) by mouth 3 (three) times daily.  Marland Kitchen ipratropium (ATROVENT) 0.02 % nebulizer solution Take 0.5 mg by nebulization every 4 (four) hours as  needed for wheezing or shortness of breath.   . DUONEB 0.5-2.5 (3) MG/3ML SOLN Use 3 cc  4 x day or every 4 hours if needed for shortness of breath.  . levothyroxine  50 MCG tablet TAKE 1 TABLET EVERY DAY (Patient taking differently: TAKE 1 TABLET BY MOUTH EVERY DAY)  . montelukast (SINGULAIR) 10 MG tablet Take 1 tablet (10 mg total) by mouth daily as needed. For allergy  . pravastatin (PRAVACHOL) 40 MG tablet Take 40 mg by mouth at bedtime.    . prochlorperazine (COMPAZINE) 5 MG tablet TAKE 1 TABLET BY MOUTH 3 TIMES A DAY FOR VERTIGO OR NAUSEA  . SPIRIVA RESPIMAT Use 1 to 2 puff once daily (Patient taking differently: Inhale 1 to 2 puffs into the lungs once daily)  . warfarin (COUMADIN) 2 MG tablet TAKE 1 TO 2 TABLETS BY MOUTH DAILY OR AS DIRECTED  . neomycin-polymyxin b-dexamethasone (MAXITROL) 3.5-10000-0.1 SUSP Take 1 to 2 drops to affected eye 4 x daily  . terconazole (TERAZOL 7) 0.4 % vaginal cream Place 1 applicator vaginally at bedtime as needed (infection).    Allergies  Allergen Reactions  . Advair Diskus [Fluticasone-Salmeterol] Other (See Comments)    SKIN CHANGES.  Can tolerate low dose of prednisone without complications  . Amiodarone Other (See Comments)    PULMONARY TOXICITY  . Codeine     unknown  .  Diovan [Valsartan] Other (See Comments)    HYPOTENSION  . Doxycycline Diarrhea and Other (See Comments)    VISUAL DISTURBANCE  . Flexeril [Cyclobenzaprine] Other (See Comments)    FATIGUE  . Keflex [Cephalexin] Diarrhea  . Verapamil Other (See Comments)    EDEMA   Past Medical History  Diagnosis Date  . Atrial fibrillation   . Bronchitis, chronic   . History of Bell's palsy   . Coronary artery disease   . Migraine headache   . Atrial flutter   . Tobacco abuse   . Osteopenia   . Cervical dysplasia   . Thyroid disease     Hypo  . Unspecified vitamin D deficiency   . Family history of anesthesia complication     Hard for daughter to wake up after Anesthesia  .  Shortness of breath     with exertion  . Cystocele     either on bladder or kidneys patient is unsure but stated physician will watch it  . Seasonal allergies   . Dizziness     If patient gets out of bed too fast  . H/O hiatal hernia   . Rosacea conjunctivitis     Right eye is worse than left eye  . Arthritis   . Gout   . Aortic stenosis     mild AS witm mild to mod AR by 11/2012 echo   Health Maintenance  Topic Date Due  . INFLUENZA VACCINE  10/14/2014  . MAMMOGRAM  02/27/2016  . TETANUS/TDAP  03/15/2017  . COLONOSCOPY  03/15/2022  . DEXA SCAN  Completed  . ZOSTAVAX  Completed  . PNA vac Low Risk Adult  Completed   Immunization History  Administered Date(s) Administered  . Influenza, High Dose Seasonal PF 12/20/2013  . Influenza-Unspecified 01/02/2013  . Pneumococcal Conjugate-13 01/23/2014  . Pneumococcal-Unspecified 03/15/1993, 05/31/2008  . Td 03/16/2007  . Varicella 02/19/2008   Past Surgical History  Procedure Laterality Date  . Broken leg Left 1970s  . Laser vein procedure    . Refractive surgery    . Colonoscopy    . Cholecystectomy  1972  . Tonsillectomy    . Eye surgery Bilateral   . Cardiac catheterization    . Abdominal aortic endovascular stent graft N/A 04/11/2013    Procedure: ABDOMINAL AORTIC ENDOVASCULAR STENT GRAFT WITH RIGHT FEMORAL PATCH ANGIOPLASTY;  Surgeon: Mal Misty, MD;  Location: [facility] OR;  Service: Vascular;  Laterality: N/A;  . Breast surgery      LEFT BREAST BIOPSY   Family History  Problem Relation Age of Onset  . Cirrhosis Mother   . Cancer Mother 42    PANCREAS  . Heart defect Sister   . Breast cancer Sister     age 42  . Heart disease Sister   . Stroke Sister   . Alcohol abuse Father   . Depression Father   . Hypertension Brother   . Hyperlipidemia Son   . Heart disease Daughter    Social History  Substance Use Topics  . Smoking status: Former Smoker    Types: Cigarettes    Quit date: 08/10/2002  . Smokeless  tobacco: Never Used     Comment: History of tobacco abuse  . Alcohol Use: 0.0 oz/week     Comment: rare    ROS Constitutional: Denies fever, chills, weight loss/gain, headaches, insomnia,  night sweats, and change in appetite. Does c/o fatigue. Eyes: Denies redness, blurred vision, diplopia, discharge, itchy, watery eyes.  ENT: Denies discharge, congestion,  post nasal drip, epistaxis, sore throat, earache, hearing loss, dental pain, Tinnitus, Vertigo, Sinus pain, snoring.  Cardio: Denies chest pain, palpitations, irregular heartbeat, syncope, dyspnea, diaphoresis, orthopnea, PND, claudication, edema Respiratory: denies cough, dyspnea, DOE, pleurisy, hoarseness, laryngitis, wheezing.  Gastrointestinal: Denies dysphagia, heartburn, reflux, water brash, pain, cramps, nausea, vomiting, bloating, diarrhea, constipation, hematemesis, melena, hematochezia, jaundice, hemorrhoids Genitourinary: Denies dysuria, frequency, urgency, nocturia, hesitancy, discharge, hematuria, flank pain Breast: Breast lumps, nipple discharge, bleeding.  Musculoskeletal: Denies arthralgia, myalgia, stiffness, Jt. Swelling, pain, limp, and strain/sprain. Denies falls. Skin: Denies puritis, rash, hives, warts, acne, eczema, changing in skin lesion Neuro: No weakness, tremor, incoordination, spasms, paresthesia, pain Psychiatric: Denies confusion, memory loss, sensory loss. Denies Depression. Endocrine: Denies change in weight, skin, hair change, nocturia, and paresthesia, diabetic polys, visual blurring, hyper / hypo glycemic episodes.  Heme/Lymph: No excessive bleeding, bruising, enlarged lymph nodes.  Physical Exam  BP 138/72 mmHg  Pulse 64  Temp(Src) 98.2 F (36.8 C) (Temporal)  Resp 18  Ht 5' 3.5" (1.613 m)  Wt 197 lb (89.359 kg)  BMI 34.35 kg/m2  General Appearance: Well nourished and in no apparent distress. Eyes: PERRLA, EOMs, conjunctiva no swelling or erythema, normal fundi and vessels. Sinuses: No  frontal/maxillary tenderness ENT/Mouth: EACs patent / TMs  nl. Nares clear without erythema, swelling, mucoid exudates. Oral hygiene is good. No erythema, swelling, or exudate. Tongue normal, non-obstructing. Tonsils not swollen or erythematous. Hearing normal.  Neck: Supple, thyroid normal. No bruits, nodes or JVD. Respiratory: Respiratory effort normal.  BS equal and clear bilateral without rales, rhonci, wheezing or stridor. Cardio: Heart sounds are normal with regular rate and rhythm and no murmurs, rubs or gallops. Peripheral pulses are normal and equal bilaterally without edema. No aortic or femoral bruits. Chest: symmetric with normal excursions and percussion. Breasts: Symmetric, without lumps, nipple discharge, retractions, or fibrocystic changes.  Abdomen: Flat, soft, with bowel sounds. Nontender, no guarding, rebound, hernias, masses, or organomegaly.  Lymphatics: Non tender without lymphadenopathy.  Genitourinary:  Musculoskeletal: Full ROM all peripheral extremities, joint stability, 5/5 strength, and normal gait. Skin: Warm and dry without rashes, lesions, cyanosis, clubbing or  ecchymosis.  Neuro: Cranial nerves intact, reflexes equal bilaterally. Normal muscle tone, no cerebellar symptoms. Sensation intact.  Pysch: Awake and oriented X 3, normal affect, Insight and Judgment appropriate.   Assessment and Plan  1. Essential hypertension  - Microalbumin / creatinine urine ratio - EKG 12-Lead - [place], RETROPERITNL ABD,  LTD - TSH  2. Hyperlipidemia  - Lipid panel  3. Prediabetes  - Hemoglobin A1c - Insulin, random  4. Vitamin D deficiency  - Vit D  25 hydroxy   5. Hypothyroidism  - TSH  6. Gastroesophageal reflux disease, esophagitis presence not specified   7. COPD (chronic obstructive pulmonary disease) with chronic bronchitis   8. Atherosclerosis of native coronary artery of native heart without angina pectoris   9. Atrial fibrillation,  unspecified   10. Morbid obesity (BMI 34+)    11. Long term current use of anticoagulant therapy  - Protime-INR  12. Medication management  - Urine Microscopic - CBC with Differential/Platelet - BASIC METABOLIC PANEL WITH GFR - Hepatic function panel - Magnesium  13. Screening for rectal cancer  - POC Hemoccult Bld/Stl   Continue prudent diet as discussed, weight control, BP monitoring, regular exercise, and medications. Discussed med's effects and SE's. Screening labs and tests as requested with regular follow-up as recommended.  Over 40 minutes of exam, counseling, chart review was performed.

## 2014-11-13 NOTE — Patient Instructions (Addendum)
Recommend Adult Low dose Aspirin or coated  Aspirin 81 mg daily   To reduce risk of Colon Cancer 20 %,   Skin Cancer 26 % ,   Melanoma 46%   and   Pancreatic cancer 60% ++++++++++++++++++ Vitamin D goal is between 70-100.   Please make sure that you are taking your Vitamin D as directed.   It is very important as a natural anti-inflammatory   helping hair, skin, and nails, as well as reducing stroke and heart attack risk.   It helps your bones and helps with mood.  It also decreases numerous cancer risks so please take it as directed.   Low Vit D is associated with a 200-300% higher risk for CANCER   and 200-300% higher risk for HEART   ATTACK  &  STROKE.   ......................................  It is also associated with higher death rate at younger ages,   autoimmune diseases like Rheumatoid arthritis, Lupus, Multiple Sclerosis.     Also many other serious conditions, like depression, Alzheimer's  Dementia, infertility, muscle aches, fatigue, fibromyalgia - just to name a few.  +++++++++++++++++++  Recommend the book "The END of DIETING" by Dr Joel Fuhrman   & the book "The END of DIABETES " by Dr Joel Fuhrman  At [employer] - get book & Audio CD's     Being diabetic has a  300% increased risk for heart attack, stroke, cancer, and alzheimer- type vascular dementia. It is very important that you work harder with diet by avoiding all foods that are white. Avoid white rice (brown & wild rice is OK), white potatoes (sweetpotatoes in moderation is OK), White bread or wheat bread or anything made out of white flour like bagels, donuts, rolls, buns, biscuits, cakes, pastries, cookies, pizza crust, and pasta (made from white flour & egg whites) - vegetarian pasta or spinach or wheat pasta is OK. Multigrain breads like Arnold's or [employer], or multigrain sandwich thins or flatbreads.  Diet, exercise and weight loss can reverse and cure diabetes in the early stages.   Diet, exercise and weight loss is very important in the control and prevention of complications of diabetes which affects every system in your body, ie. Brain - dementia/stroke, eyes - glaucoma/blindness, heart - heart attack/heart failure, kidneys - dialysis, stomach - gastric paralysis, intestines - malabsorption, nerves - severe painful neuritis, circulation - gangrene & loss of a leg(s), and finally cancer and Alzheimers.    I recommend avoid fried & greasy foods,  sweets/candy, white rice (brown or wild rice or Quinoa is OK), white potatoes (sweet potatoes are OK) - anything made from white flour - bagels, doughnuts, rolls, buns, biscuits,white and wheat breads, pizza crust and traditional pasta made of white flour & egg white(vegetarian pasta or spinach or wheat pasta is OK).  Multi-grain bread is OK - like multi-grain flat bread or sandwich thins. Avoid alcohol in excess. Exercise is also important.    Eat all the vegetables you want - avoid meat, especially red meat and dairy - especially cheese.  Cheese is the most concentrated form of trans-fats which is the worst thing to clog up our arteries. Veggie cheese is OK which can be found in the fresh produce section at [employer] or [employer] or [employer]  ++++++++++++++++++++++++++   Preventive Care for Adults  A healthy lifestyle and preventive care can promote health and wellness. Preventive health guidelines for women include the following key practices.  A routine yearly physical is a good way   to check with your health care provider about your health and preventive screening. It is a chance to share any concerns and updates on your health and to receive a thorough exam.  Visit your dentist for a routine exam and preventive care every 6 months. Brush your teeth twice a day and floss once a day. Good oral hygiene prevents tooth decay and gum disease.  The frequency of eye exams is based on your age, health, family medical history, use  of contact lenses, and other factors. Follow your health care provider's recommendations for frequency of eye exams.  Eat a healthy diet. Foods like vegetables, fruits, whole grains, low-fat dairy products, and lean protein foods contain the nutrients you need without too many calories. Decrease your intake of foods high in solid fats, added sugars, and salt. Eat the right amount of calories for you.Get information about a proper diet from your health care provider, if necessary.  Regular physical exercise is one of the most important things you can do for your health. Most adults should get at least 150 minutes of moderate-intensity exercise (any activity that increases your heart rate and causes you to sweat) each week. In addition, most adults need muscle-strengthening exercises on 2 or more days a week.  Maintain a healthy weight. The body mass index (BMI) is a screening tool to identify possible weight problems. It provides an estimate of body fat based on height and weight. Your health care provider can find your BMI and can help you achieve or maintain a healthy weight.For adults 20 years and older:  A BMI below 18.5 is considered underweight.  A BMI of 18.5 to 24.9 is normal.  A BMI of 25 to 29.9 is considered overweight.  A BMI of 30 and above is considered obese.  Maintain normal blood lipids and cholesterol levels by exercising and minimizing your intake of saturated fat. Eat a balanced diet with plenty of fruit and vegetables. If your lipid or cholesterol levels are high, you are over 50, or you are at high risk for heart disease, you may need your cholesterol levels checked more frequently.Ongoing high lipid and cholesterol levels should be treated with medicines if diet and exercise are not working.  If you smoke, find out from your health care provider how to quit. If you do not use tobacco, do not start.  Lung cancer screening is recommended for adults aged 55-80 years who are  at high risk for developing lung cancer because of a history of smoking. A yearly low-dose CT scan of the lungs is recommended for people who have at least a 30-pack-year history of smoking and are a current smoker or have quit within the past 15 years. A pack year of smoking is smoking an average of 1 pack of cigarettes a day for 1 year (for example: 1 pack a day for 30 years or 2 packs a day for 15 years). Yearly screening should continue until the smoker has stopped smoking for at least 15 years. Yearly screening should be stopped for people who develop a health problem that would prevent them from having lung cancer treatment.  Avoid use of street drugs. Do not share needles with anyone. Ask for help if you need support or instructions about stopping the use of drugs.  High blood pressure causes heart disease and increases the risk of stroke.  Ongoing high blood pressure should be treated with medicines if weight loss and exercise do not work.  If you are 55-79   years old, ask your health care provider if you should take aspirin to prevent strokes.  Diabetes screening involves taking a blood sample to check your fasting blood sugar level. This should be done once every 3 years, after age 45, if you are within normal weight and without risk factors for diabetes. Testing should be considered at a younger age or be carried out more frequently if you are overweight and have at least 1 risk factor for diabetes.  Breast cancer screening is essential preventive care for women. You should practice "breast self-awareness." This means understanding the normal appearance and feel of your breasts and may include breast self-examination. Any changes detected, no matter how small, should be reported to a health care provider. Women in their 20s and 30s should have a clinical breast exam (CBE) by a health care provider as part of a regular health exam every 1 to 3 years. After age 40, women should have a CBE every  year. Starting at age 40, women should consider having a mammogram (breast X-ray test) every year. Women who have a family history of breast cancer should talk to their health care provider about genetic screening. Women at a high risk of breast cancer should talk to their health care providers about having an MRI and a mammogram every year.  Breast cancer gene (BRCA)-related cancer risk assessment is recommended for women who have family members with BRCA-related cancers. BRCA-related cancers include breast, ovarian, tubal, and peritoneal cancers. Having family members with these cancers may be associated with an increased risk for harmful changes (mutations) in the breast cancer genes BRCA1 and BRCA2. Results of the assessment will determine the need for genetic counseling and BRCA1 and BRCA2 testing.  Routine pelvic exams to screen for cancer are no longer recommended for nonpregnant women who are considered low risk for cancer of the pelvic organs (ovaries, uterus, and vagina) and who do not have symptoms. Ask your health care provider if a screening pelvic exam is right for you.  If you have had past treatment for cervical cancer or a condition that could lead to cancer, you need Pap tests and screening for cancer for at least 20 years after your treatment. If Pap tests have been discontinued, your risk factors (such as having a new sexual partner) need to be reassessed to determine if screening should be resumed. Some women have medical problems that increase the chance of getting cervical cancer. In these cases, your health care provider may recommend more frequent screening and Pap tests.    Colorectal cancer can be detected and often prevented. Most routine colorectal cancer screening begins at the age of 50 years and continues through age 75 years. However, your health care provider may recommend screening at an earlier age if you have risk factors for colon cancer. On a yearly basis, your health  care provider may provide home test kits to check for hidden blood in the stool. Use of a small camera at the end of a tube, to directly examine the colon (sigmoidoscopy or colonoscopy), can detect the earliest forms of colorectal cancer. Talk to your health care provider about this at age 50, when routine screening begins. Direct exam of the colon should be repeated every 5-10 years through age 75 years, unless early forms of pre-cancerous polyps or small growths are found.  Osteoporosis is a disease in which the bones lose minerals and strength with aging. This can result in serious bone fractures or breaks. The risk of osteoporosis   can be identified using a bone density scan. Women ages 65 years and over and women at risk for fractures or osteoporosis should discuss screening with their health care providers. Ask your health care provider whether you should take a calcium supplement or vitamin D to reduce the rate of osteoporosis.  Menopause can be associated with physical symptoms and risks. Hormone replacement therapy is available to decrease symptoms and risks. You should talk to your health care provider about whether hormone replacement therapy is right for you.  Use sunscreen. Apply sunscreen liberally and repeatedly throughout the day. You should seek shade when your shadow is shorter than you. Protect yourself by wearing long sleeves, pants, a wide-brimmed hat, and sunglasses year round, whenever you are outdoors.  Once a month, do a whole body skin exam, using a mirror to look at the skin on your back. Tell your health care provider of new moles, moles that have irregular borders, moles that are larger than a pencil eraser, or moles that have changed in shape or color.  Stay current with required vaccines (immunizations).  Influenza vaccine. All adults should be immunized every year.  Tetanus, diphtheria, and acellular pertussis (Td, Tdap) vaccine. Pregnant women should receive 1 dose of  Tdap vaccine during each pregnancy. The dose should be obtained regardless of the length of time since the last dose. Immunization is preferred during the 27th-36th week of gestation. An adult who has not previously received Tdap or who does not know her vaccine status should receive 1 dose of Tdap. This initial dose should be followed by tetanus and diphtheria toxoids (Td) booster doses every 10 years. Adults with an unknown or incomplete history of completing a 3-dose immunization series with Td-containing vaccines should begin or complete a primary immunization series including a Tdap dose. Adults should receive a Td booster every 10 years.    Zoster vaccine. One dose is recommended for adults aged 60 years or older unless certain conditions are present.    Pneumococcal 13-valent conjugate (PCV13) vaccine. When indicated, a person who is uncertain of her immunization history and has no record of immunization should receive the PCV13 vaccine. An adult aged 19 years or older who has certain medical conditions and has not been previously immunized should receive 1 dose of PCV13 vaccine. This PCV13 should be followed with a dose of pneumococcal polysaccharide (PPSV23) vaccine. The PPSV23 vaccine dose should be obtained at least 8 weeks after the dose of PCV13 vaccine. An adult aged 19 years or older who has certain medical conditions and previously received 1 or more doses of PPSV23 vaccine should receive 1 dose of PCV13. The PCV13 vaccine dose should be obtained 1 or more years after the last PPSV23 vaccine dose.    Pneumococcal polysaccharide (PPSV23) vaccine. When PCV13 is also indicated, PCV13 should be obtained first. All adults aged 65 years and older should be immunized. An adult younger than age 65 years who has certain medical conditions should be immunized. Any person who resides in a nursing home or long-term care facility should be immunized. An adult smoker should be immunized. People with an  immunocompromised condition and certain other conditions should receive both PCV13 and PPSV23 vaccines. People with human immunodeficiency virus (HIV) infection should be immunized as soon as possible after diagnosis. Immunization during chemotherapy or radiation therapy should be avoided. Routine use of PPSV23 vaccine is not recommended for American Indians, [place] Natives, or people younger than 65 years unless there are medical conditions that require   PPSV23 vaccine. When indicated, people who have unknown immunization and have no record of immunization should receive PPSV23 vaccine. One-time revaccination 5 years after the first dose of PPSV23 is recommended for people aged 19-64 years who have chronic kidney failure, nephrotic syndrome, asplenia, or immunocompromised conditions. People who received 1-2 doses of PPSV23 before age 65 years should receive another dose of PPSV23 vaccine at age 65 years or later if at least 5 years have passed since the previous dose. Doses of PPSV23 are not needed for people immunized with PPSV23 at or after age 65 years.   Preventive Services / Frequency  Ages 65 years and over  Blood pressure check.  Lipid and cholesterol check.  Lung cancer screening. / Every year if you are aged 55-80 years and have a 30-pack-year history of smoking and currently smoke or have quit within the past 15 years. Yearly screening is stopped once you have quit smoking for at least 15 years or develop a health problem that would prevent you from having lung cancer treatment.  Clinical breast exam.** / Every year after age 40 years.  BRCA-related cancer risk assessment.** / For women who have family members with a BRCA-related cancer (breast, ovarian, tubal, or peritoneal cancers).  Mammogram.** / Every year beginning at age 40 years and continuing for as long as you are in good health. Consult with your health care provider.  Pap test.** / Every 3 years starting at age 30 years  through age 65 or 70 years with 3 consecutive normal Pap tests. Testing can be stopped between 65 and 70 years with 3 consecutive normal Pap tests and no abnormal Pap or HPV tests in the past 10 years.  Fecal occult blood test (FOBT) of stool. / Every year beginning at age 50 years and continuing until age 75 years. You may not need to do this test if you get a colonoscopy every 10 years.  Flexible sigmoidoscopy or colonoscopy.** / Every 5 years for a flexible sigmoidoscopy or every 10 years for a colonoscopy beginning at age 50 years and continuing until age 75 years.  Hepatitis C blood test.** / For all people born from 1945 through 1965 and any individual with known risks for hepatitis C.  Osteoporosis screening.** / A one-time screening for women ages 65 years and over and women at risk for fractures or osteoporosis.  Skin self-exam. / Monthly.  Influenza vaccine. / Every year.  Tetanus, diphtheria, and acellular pertussis (Tdap/Td) vaccine.** / 1 dose of Td every 10 years.  Zoster vaccine.** / 1 dose for adults aged 60 years or older.  Pneumococcal 13-valent conjugate (PCV13) vaccine.** / Consult your health care provider.  Pneumococcal polysaccharide (PPSV23) vaccine.** / 1 dose for all adults aged 65 years and older. Screening for abdominal aortic aneurysm (AAA)  by ultrasound is recommended for people who have history of high blood pressure or who are current or former smokers. 

## 2014-11-14 LAB — BASIC METABOLIC PANEL WITH GFR
BUN: 26 mg/dL — ABNORMAL HIGH (ref 7–25)
CALCIUM: 9.9 mg/dL (ref 8.6–10.4)
CHLORIDE: 98 mmol/L (ref 98–110)
CO2: 21 mmol/L (ref 20–31)
Creat: 1.23 mg/dL — ABNORMAL HIGH (ref 0.60–0.93)
GFR, EST NON AFRICAN AMERICAN: 44 mL/min — AB (ref >=60)
GFR, Est African American: 51 mL/min — ABNORMAL LOW (ref >=60)
Glucose, Bld: 84 mg/dL (ref 65–99)
Potassium: 3.8 mmol/L (ref 3.5–5.3)
SODIUM: 137 mmol/L (ref 135–146)

## 2014-11-14 LAB — CBC WITH DIFFERENTIAL/PLATELET
BASOS PCT: 0 % (ref 0–1)
Basophils Absolute: 0 10*3/uL (ref 0.0–0.1)
Eosinophils Absolute: 0.5 10*3/uL (ref 0.0–0.7)
Eosinophils Relative: 5 % (ref 0–5)
HCT: 45.1 % (ref 36.0–46.0)
HEMOGLOBIN: 15.2 g/dL — AB (ref 12.0–15.0)
Lymphocytes Relative: 24 % (ref 12–46)
Lymphs Abs: 2.3 10*3/uL (ref 0.7–4.0)
MCH: 31.5 pg (ref 26.0–34.0)
MCHC: 33.7 g/dL (ref 30.0–36.0)
MCV: 93.6 fL (ref 78.0–100.0)
MPV: 10.2 fL (ref 8.6–12.4)
Monocytes Absolute: 0.8 10*3/uL (ref 0.1–1.0)
Monocytes Relative: 8 % (ref 3–12)
NEUTROS ABS: 6 10*3/uL (ref 1.7–7.7)
NEUTROS PCT: 63 % (ref 43–77)
Platelets: 227 10*3/uL (ref 150–400)
RBC: 4.82 MIL/uL (ref 3.87–5.11)
RDW: 15.9 % — ABNORMAL HIGH (ref 11.5–15.5)
WBC: 9.6 10*3/uL (ref 4.0–10.5)

## 2014-11-14 LAB — URINALYSIS, MICROSCOPIC ONLY
BACTERIA UA: NONE SEEN [HPF]
CRYSTALS: NONE SEEN [HPF]
Casts: NONE SEEN [LPF]
RBC / HPF: NONE SEEN RBC/HPF (ref <=2)
Yeast: NONE SEEN [HPF]

## 2014-11-14 LAB — PROTIME-INR
INR: 2.92 — AB (ref <1.50)
Prothrombin Time: 30.9 seconds — ABNORMAL HIGH (ref 11.6–15.2)

## 2014-11-14 LAB — HEPATIC FUNCTION PANEL
ALT: 13 U/L (ref 6–29)
AST: 14 U/L (ref 10–35)
Albumin: 3.8 g/dL (ref 3.6–5.1)
Alkaline Phosphatase: 35 U/L (ref 33–130)
BILIRUBIN DIRECT: 0.2 mg/dL (ref <=0.2)
BILIRUBIN INDIRECT: 0.4 mg/dL (ref 0.2–1.2)
Total Bilirubin: 0.6 mg/dL (ref 0.2–1.2)
Total Protein: 6.3 g/dL (ref 6.1–8.1)

## 2014-11-14 LAB — MICROALBUMIN / CREATININE URINE RATIO: Creatinine, Urine: 14.5 mg/dL

## 2014-11-14 LAB — VITAMIN D 25 HYDROXY (VIT D DEFICIENCY, FRACTURES): Vit D, 25-Hydroxy: 35 ng/mL (ref 30–100)

## 2014-11-14 LAB — LIPID PANEL
CHOL/HDL RATIO: 4.2 ratio (ref <=5.0)
CHOLESTEROL: 154 mg/dL (ref 125–200)
HDL: 37 mg/dL — ABNORMAL LOW (ref >=46)
LDL Cholesterol: 82 mg/dL (ref <130)
Triglycerides: 174 mg/dL — ABNORMAL HIGH (ref <150)
VLDL: 35 mg/dL — AB (ref <30)

## 2014-11-14 LAB — MAGNESIUM: Magnesium: 2 mg/dL (ref 1.5–2.5)

## 2014-11-14 LAB — URIC ACID: URIC ACID, SERUM: 5.4 mg/dL (ref 2.4–7.0)

## 2014-11-14 LAB — HEMOGLOBIN A1C
HEMOGLOBIN A1C: 5.8 % — AB (ref <5.7)
Mean Plasma Glucose: 120 mg/dL — ABNORMAL HIGH (ref <117)

## 2014-11-14 LAB — TSH: TSH: 6.251 u[IU]/mL — ABNORMAL HIGH (ref 0.350–4.500)

## 2014-11-14 LAB — INSULIN, RANDOM: Insulin: 21.1 u[IU]/mL — ABNORMAL HIGH (ref 2.0–19.6)

## 2014-12-09 ENCOUNTER — Other Ambulatory Visit: Payer: Self-pay | Admitting: *Deleted

## 2014-12-09 ENCOUNTER — Ambulatory Visit (INDEPENDENT_AMBULATORY_CARE_PROVIDER_SITE_OTHER): Payer: Medicare Other | Admitting: *Deleted

## 2014-12-09 DIAGNOSIS — E039 Hypothyroidism, unspecified: Secondary | ICD-10-CM

## 2014-12-09 DIAGNOSIS — Z23 Encounter for immunization: Secondary | ICD-10-CM

## 2014-12-09 DIAGNOSIS — Z7901 Long term (current) use of anticoagulants: Secondary | ICD-10-CM

## 2014-12-09 LAB — TSH: TSH: 3.622 u[IU]/mL (ref 0.350–4.500)

## 2014-12-09 LAB — PROTIME-INR
INR: 2.6 — AB (ref <1.50)
Prothrombin Time: 28.2 seconds — ABNORMAL HIGH (ref 11.6–15.2)

## 2014-12-09 MED ORDER — PREDNISONE 10 MG PO TABS: ORAL_TABLET | ORAL | Status: DC

## 2014-12-09 NOTE — Progress Notes (Signed)
Patient ID: Katie [name], female   DOB: [birth year], 74 y.o.   MRN: [id number] Patient here for TSH.  Patient taking Levothyroxine 50 mcg 1 1/2 daily x 7 days.  She is having protime,also.  She is taking 2 mg every day.  Patient also complained of ankle pain and requested an RX for the pain.  OK to send in RX for Prednisone 10 mg per Dr Melford Aase.

## 2014-12-15 ENCOUNTER — Other Ambulatory Visit: Payer: Self-pay | Admitting: Internal Medicine

## 2014-12-16 ENCOUNTER — Other Ambulatory Visit: Payer: Self-pay | Admitting: Internal Medicine

## 2014-12-16 DIAGNOSIS — T148XXA Other injury of unspecified body region, initial encounter: Secondary | ICD-10-CM

## 2014-12-17 ENCOUNTER — Ambulatory Visit (INDEPENDENT_AMBULATORY_CARE_PROVIDER_SITE_OTHER): Payer: Medicare Other | Admitting: *Deleted

## 2014-12-17 DIAGNOSIS — Z7901 Long term (current) use of anticoagulants: Secondary | ICD-10-CM

## 2014-12-17 DIAGNOSIS — T148 Other injury of unspecified body region: Secondary | ICD-10-CM | POA: Diagnosis not present

## 2014-12-17 DIAGNOSIS — T148XXA Other injury of unspecified body region, initial encounter: Secondary | ICD-10-CM

## 2014-12-17 LAB — PROTIME-INR
INR: 1.62 — ABNORMAL HIGH (ref <1.50)
Prothrombin Time: 19.4 seconds — ABNORMAL HIGH (ref 11.6–15.2)

## 2014-12-17 NOTE — Progress Notes (Signed)
Patient ID: Katie [name], female   DOB: 1941-01-19, 74 y.o.   MRN: [id number] Patient presents for recheck PT/INR with concerns about increased bruising.  Patient states she currently is taking Coumadin 2 mg QD.

## 2015-01-08 ENCOUNTER — Encounter: Payer: Self-pay | Admitting: Internal Medicine

## 2015-01-08 ENCOUNTER — Ambulatory Visit: Payer: Self-pay

## 2015-01-08 ENCOUNTER — Ambulatory Visit (INDEPENDENT_AMBULATORY_CARE_PROVIDER_SITE_OTHER): Payer: Medicare Other | Admitting: Internal Medicine

## 2015-01-08 VITALS — BP 146/84 | HR 86 | Temp 98.0°F | Resp 16 | Ht 63.5 in | Wt 200.0 lb

## 2015-01-08 DIAGNOSIS — L02214 Cutaneous abscess of groin: Secondary | ICD-10-CM

## 2015-01-08 DIAGNOSIS — I4891 Unspecified atrial fibrillation: Secondary | ICD-10-CM

## 2015-01-08 DIAGNOSIS — M25572 Pain in left ankle and joints of left foot: Secondary | ICD-10-CM | POA: Diagnosis not present

## 2015-01-08 DIAGNOSIS — Z7901 Long term (current) use of anticoagulants: Secondary | ICD-10-CM | POA: Diagnosis not present

## 2015-01-08 MED ORDER — SULFAMETHOXAZOLE-TRIMETHOPRIM 800-160 MG PO TABS: 1.0000 | ORAL_TABLET | Freq: Two times a day (BID) | ORAL | Status: DC

## 2015-01-08 MED ORDER — PREDNISONE 20 MG PO TABS: ORAL_TABLET | ORAL | Status: DC

## 2015-01-08 NOTE — Progress Notes (Signed)
Patient ID: Katie [name], female   DOB: 1940/11/09, 74 y.o.   MRN: [id number]  Assessment and Plan:   1. Long term current use of anticoagulant therapy -cont coumadin -cut coumadin in half while on bactrim - Protime-INR  2. Atrial fibrillation, unspecified type ([place]) -cont coumadin  3. Abscess of groin, left -bactrim -warm compresses -cut coumadin in half  4. Pain in joint, ankle and foot, left -prednisone 2 week taper     HPI 74 y.o.female presents for 1 month follow up of . Patient reports that they have been doing well.  female is taking their medication.  They are not having difficulty with their medications.  They report no adverse reactions.    Patient reports that she does have a spot on the left side of her groin x 2-3 days which has been mildly painful.  She reports that it looks like a boil.  She also reports that she has been having an issue with her sinuses.  She reports that she has been using monteleukast and has also been taking daily antihistamines.      Past Medical History  Diagnosis Date  . Atrial fibrillation ([place])   . Bronchitis, chronic ([place])   . History of Bell's palsy   . Coronary artery disease   . Migraine headache   . Atrial flutter ([place])   . Tobacco abuse   . Osteopenia   . Cervical dysplasia   . Thyroid disease     Hypo  . Unspecified vitamin D deficiency   . Family history of anesthesia complication     Hard for daughter to wake up after Anesthesia  . Shortness of breath     with exertion  . Cystocele     either on bladder or kidneys patient is unsure but stated physician will watch it  . Seasonal allergies   . Dizziness     If patient gets out of bed too fast  . H/O hiatal hernia   . Rosacea conjunctivitis     Right eye is worse than left eye  . Arthritis   . Gout   . Aortic stenosis     mild AS witm mild to mod AR by 11/2012 echo     Allergies  Allergen Reactions  . Advair Diskus [Fluticasone-Salmeterol] Other (See  Comments)    SKIN CHANGES.  Can tolerate low dose of prednisone without complications  . Amiodarone Other (See Comments)    PULMONARY TOXICITY  . Codeine     unknown  . Diovan [Valsartan] Other (See Comments)    HYPOTENSION  . Doxycycline Diarrhea and Other (See Comments)    VISUAL DISTURBANCE  . Flexeril [Cyclobenzaprine] Other (See Comments)    FATIGUE  . Keflex [Cephalexin] Diarrhea  . Verapamil Other (See Comments)    EDEMA      Current Outpatient Prescriptions on File Prior to Visit  Medication Sig Dispense Refill  . albuterol (PROVENTIL HFA;VENTOLIN HFA) 108 (90 BASE) MCG/ACT inhaler 2 puffs 5 minutes apart every 4 to 6 hours as needed to rescue asthma (Patient taking differently: Inhale 2 puffs into the lungs 5 minutes apart every 4 to 6 hours as needed to rescue asthma) 1 Inhaler 11  . allopurinol (ZYLOPRIM) 300 MG tablet TAKE 1 TABLET BY MOUTH DAILY 90 tablet 1  . aspirin 81 MG tablet Take 81 mg by mouth daily.      . benazepril (LOTENSIN) 20 MG tablet TAKE 1 TABLET BY MOUTH DAILY AS NEEDED FOR BLOOD PRESSURE AND  HEART 90 tablet 2  . bisoprolol-hydrochlorothiazide (ZIAC) 5-6.25 MG per tablet Take 1 tablet by mouth daily.    Marland Kitchen dexamethasone (DECADRON) 0.1 % ophthalmic suspension Place 1 drop into both eyes daily.    . digoxin (LANOXIN) 0.25 MG tablet TAKE 1 TABLET BY MOUTH DAILY 90 tablet 3  . fluconazole (DIFLUCAN) 150 MG tablet Take 1 tablet 2 x week  For yeast infection 8 tablet 2  . furosemide (LASIX) 80 MG tablet TAKE 1 TABLET BY MOUTH TWICE A DAY AS NEEDED FOR FLUID 180 tablet 1  . gabapentin (NEURONTIN) 300 MG capsule Take 1 capsule (300 mg total) by mouth 3 (three) times daily. 270 capsule 3  . ipratropium (ATROVENT) 0.02 % nebulizer solution Take 0.5 mg by nebulization every 4 (four) hours as needed for wheezing or shortness of breath.     Marland Kitchen ipratropium-albuterol (DUONEB) 0.5-2.5 (3) MG/3ML SOLN Use 3 cc  4 x day or every 4 hours if needed for shortness of breath. 360  mL 99  . levothyroxine (SYNTHROID, LEVOTHROID) 50 MCG tablet TAKE 1 TABLET EVERY DAY 90 tablet 3  . montelukast (SINGULAIR) 10 MG tablet Take 1 tablet (10 mg total) by mouth daily as needed. For allergy 90 tablet 3  . pravastatin (PRAVACHOL) 40 MG tablet Take 40 mg by mouth at bedtime.      . predniSONE (DELTASONE) 10 MG tablet Take 1 tablet tid x 2 days, 1 tablet bid x 2 days and 1 tablet daily 3 days. 13 tablet 0  . prochlorperazine (COMPAZINE) 5 MG tablet TAKE 1 TABLET BY MOUTH 3 TIMES A DAY FOR VERTIGO OR NAUSEA 90 tablet 8  . Tiotropium Bromide Monohydrate (SPIRIVA RESPIMAT) 2.5 MCG/ACT AERS Use 1 to 2 puff once daily (Patient taking differently: Inhale 1 to 2 puffs into the lungs once daily) 4 g 99  . warfarin (COUMADIN) 2 MG tablet TAKE 1 TO 2 TABLETS BY MOUTH DAILY OR AS DIRECTED 180 tablet 3   No current facility-administered medications on file prior to visit.    ROS: all negative except above.   Physical Exam: Filed Weights   01/08/15 0948  Weight: 200 lb (90.719 kg)   BP 146/84 mmHg  Pulse 86  Temp(Src) 98 F (36.7 C) (Temporal)  Resp 16  Ht 5' 3.5" (1.613 m)  Wt 200 lb (90.719 kg)  BMI 34.87 kg/m2 General Appearance: Well developed well nourished, non-toxic appearing in no apparent distress. Eyes: PERRLA, EOMs, conjunctiva w/ no swelling or erythema or discharge Sinuses: No Frontal/maxillary tenderness ENT/Mouth: Ear canals clear without swelling or erythema.  TM's normal bilaterally with no retractions, bulging, or loss of landmarks.   Neck: Supple, thyroid normal, no notable JVD  Respiratory: Respiratory effort normal, Clear breath sounds anteriorly and posteriorly bilaterally without rales, rhonchi, wheezing or stridor. No retractions or accessory muscle usage. Cardio: RRR with no MRGs.   Abdomen: Soft, + BS.  Non tender, no guarding, rebound, hernias, masses.  Musculoskeletal: Full ROM, 5/5 strength, normal gait.  Skin: Warm, dry without rashes  Neuro: Awake and  oriented X 3, Cranial nerves intact. Normal muscle tone, no cerebellar symptoms. Sensation intact.  Psych: normal affect, Insight and Judgment appropriate.     Starlyn Skeans, PA-C 10:07 AM [facility]

## 2015-01-08 NOTE — Patient Instructions (Addendum)
Please ask Dr. Rush Farmer about either synvisc or orthovisc for helping with your knee pain.  Please use warm compresses on the boil as often as you can tolerate.  Please use the bactrim twice daily for a week.  If it doesn't get better or gets worse please let me know.  Please finish the prednisone until it is gone to help with your sinuses and also to help with your joint pains.  Please use the dymista nasal spray.  One spray per nostril prior to bedtime.

## 2015-01-09 LAB — PROTIME-INR
INR: 2.77 — ABNORMAL HIGH (ref <1.50)
Prothrombin Time: 29.7 seconds — ABNORMAL HIGH (ref 11.6–15.2)

## 2015-01-12 ENCOUNTER — Other Ambulatory Visit: Payer: Self-pay | Admitting: Internal Medicine

## 2015-01-13 ENCOUNTER — Other Ambulatory Visit: Payer: Self-pay | Admitting: Internal Medicine

## 2015-01-20 ENCOUNTER — Encounter: Payer: Self-pay | Admitting: *Deleted

## 2015-01-20 ENCOUNTER — Other Ambulatory Visit: Payer: Self-pay | Admitting: Internal Medicine

## 2015-01-20 NOTE — Telephone Encounter (Signed)
This encounter was created in error - please disregard.

## 2015-02-10 ENCOUNTER — Other Ambulatory Visit: Payer: Self-pay

## 2015-02-10 DIAGNOSIS — M1711 Unilateral primary osteoarthritis, right knee: Secondary | ICD-10-CM | POA: Diagnosis not present

## 2015-02-10 DIAGNOSIS — M19072 Primary osteoarthritis, left ankle and foot: Secondary | ICD-10-CM | POA: Diagnosis not present

## 2015-02-10 DIAGNOSIS — I10 Essential (primary) hypertension: Secondary | ICD-10-CM

## 2015-02-10 DIAGNOSIS — M1712 Unilateral primary osteoarthritis, left knee: Secondary | ICD-10-CM | POA: Diagnosis not present

## 2015-02-10 MED ORDER — BISOPROLOL-HYDROCHLOROTHIAZIDE 5-6.25 MG PO TABS: 1.0000 | ORAL_TABLET | Freq: Every day | ORAL | Status: DC

## 2015-02-12 ENCOUNTER — Encounter: Payer: Self-pay | Admitting: Physician Assistant

## 2015-02-12 ENCOUNTER — Ambulatory Visit (INDEPENDENT_AMBULATORY_CARE_PROVIDER_SITE_OTHER): Payer: Medicare Other | Admitting: Physician Assistant

## 2015-02-12 VITALS — BP 120/82 | HR 76 | Temp 97.9°F | Resp 16 | Ht 63.5 in | Wt 197.0 lb

## 2015-02-12 DIAGNOSIS — I739 Peripheral vascular disease, unspecified: Secondary | ICD-10-CM | POA: Diagnosis not present

## 2015-02-12 DIAGNOSIS — N183 Chronic kidney disease, stage 3 unspecified: Secondary | ICD-10-CM

## 2015-02-12 DIAGNOSIS — I714 Abdominal aortic aneurysm, without rupture, unspecified: Secondary | ICD-10-CM

## 2015-02-12 DIAGNOSIS — B372 Candidiasis of skin and nail: Secondary | ICD-10-CM

## 2015-02-12 DIAGNOSIS — I10 Essential (primary) hypertension: Secondary | ICD-10-CM

## 2015-02-12 DIAGNOSIS — E782 Mixed hyperlipidemia: Secondary | ICD-10-CM | POA: Diagnosis not present

## 2015-02-12 DIAGNOSIS — Z1389 Encounter for screening for other disorder: Secondary | ICD-10-CM

## 2015-02-12 DIAGNOSIS — I4891 Unspecified atrial fibrillation: Secondary | ICD-10-CM

## 2015-02-12 DIAGNOSIS — L22 Diaper dermatitis: Secondary | ICD-10-CM

## 2015-02-12 DIAGNOSIS — Z1331 Encounter for screening for depression: Secondary | ICD-10-CM

## 2015-02-12 DIAGNOSIS — J449 Chronic obstructive pulmonary disease, unspecified: Secondary | ICD-10-CM

## 2015-02-12 DIAGNOSIS — I5020 Unspecified systolic (congestive) heart failure: Secondary | ICD-10-CM | POA: Diagnosis not present

## 2015-02-12 DIAGNOSIS — E039 Hypothyroidism, unspecified: Secondary | ICD-10-CM | POA: Diagnosis not present

## 2015-02-12 LAB — TSH: TSH: 0.561 u[IU]/mL (ref 0.350–4.500)

## 2015-02-12 LAB — PROTIME-INR
INR: 2.3 — AB (ref <1.50)
PROTHROMBIN TIME: 25.6 s — AB (ref 11.6–15.2)

## 2015-02-12 MED ORDER — FLUCONAZOLE 150 MG PO TABS: ORAL_TABLET | ORAL | Status: DC

## 2015-02-12 MED ORDER — AZITHROMYCIN 250 MG PO TABS: ORAL_TABLET | ORAL | Status: AC

## 2015-02-12 MED ORDER — LEVOTHYROXINE SODIUM 75 MCG PO TABS: 75.0000 ug | ORAL_TABLET | Freq: Every day | ORAL | Status: DC

## 2015-02-12 MED ORDER — BISOPROLOL-HYDROCHLOROTHIAZIDE 5-6.25 MG PO TABS: 1.0000 | ORAL_TABLET | Freq: Every day | ORAL | Status: DC

## 2015-02-12 NOTE — Progress Notes (Signed)
Coumadin follow up  Patient is on Coumadin for Primary Diagnosis: Atrial fibrillation, unspecified type ([place]) [I48.91] Patient's last INR is  Lab Results  Component Value Date   INR 2.77* 01/08/2015   INR 1.62* 12/17/2014   INR 2.60* 12/09/2014    Patient denies SOB, CP, dizziness, nose bleeds, easy bleeding, and blood in stool/urine. Her coumadin dose was not changed last visit. 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   She is on thyroid medication. Her medication was changed last visit.   Lab Results  Component Value Date   TSH 3.622 12/09/2014  .      Current Outpatient Prescriptions on File Prior to Visit  Medication Sig Dispense Refill  . albuterol (PROVENTIL HFA;VENTOLIN HFA) 108 (90 BASE) MCG/ACT inhaler 2 puffs 5 minutes apart every 4 to 6 hours as needed to rescue asthma (Patient taking differently: Inhale 2 puffs into the lungs 5 minutes apart every 4 to 6 hours as needed to rescue asthma) 1 Inhaler 11  . allopurinol (ZYLOPRIM) 300 MG tablet TAKE 1 TABLET BY MOUTH DAILY 90 tablet 1  . aspirin 81 MG tablet Take 81 mg by mouth daily.      . benazepril (LOTENSIN) 20 MG tablet TAKE 1 TABLET BY MOUTH DAILY AS NEEDED FOR BLOOD PRESSURE AND HEART 90 tablet 2  . bisoprolol-hydrochlorothiazide (ZIAC) 5-6.25 MG tablet Take 1 tablet by mouth daily. 30 tablet 3  . dexamethasone (DECADRON) 0.1 % ophthalmic suspension Place 1 drop into both eyes daily.    . digoxin (LANOXIN) 0.25 MG tablet TAKE 1 TABLET BY MOUTH DAILY 90 tablet 3  . fluconazole (DIFLUCAN) 150 MG tablet Take 1 tablet 2 x week  For yeast infection 8 tablet 2  . furosemide (LASIX) 80 MG tablet TAKE 1 TABLET BY MOUTH TWICE A DAY AS NEEDED FOR FLUID 180 tablet 1  . gabapentin (NEURONTIN) 300 MG capsule Take 1 capsule (300 mg total) by mouth 3 (three)  times daily. 270 capsule 3  . ipratropium (ATROVENT) 0.02 % nebulizer solution Take 0.5 mg by nebulization every 4 (four) hours as needed for wheezing or shortness of breath.     Marland Kitchen ipratropium-albuterol (DUONEB) 0.5-2.5 (3) MG/3ML SOLN Use 3 cc  4 x day or every 4 hours if needed for shortness of breath. 360 mL 99  . levothyroxine (SYNTHROID, LEVOTHROID) 50 MCG tablet TAKE 1 TABLET EVERY DAY 90 tablet 1  . montelukast (SINGULAIR) 10 MG tablet Take 1 tablet (10 mg total) by mouth daily as needed. For allergy 90 tablet 3  . pravastatin (PRAVACHOL) 40 MG tablet TAKE 1 TABLET BY MOUTH AT BEDTIME FOR CHOLESTEROL 90 tablet 1  . predniSONE (DELTASONE) 20 MG tablet TAKE AS DIRECTED 3 FOR 3 DAYS, 2 FOR 3 DAYS, 1 AND 1/2 FOR 2 DAYS , 1 FOR 3 DAYS, 1/2 FOR 3 DAYS 27 tablet 0  . prochlorperazine (COMPAZINE) 5 MG tablet TAKE 1 TABLET BY MOUTH 3 TIMES A DAY FOR VERTIGO OR NAUSEA 90 tablet 8  . sulfamethoxazole-trimethoprim (BACTRIM DS,SEPTRA DS) 800-160 MG tablet Take 1 tablet by mouth 2 (two) times daily. 14 tablet 0  . Tiotropium Bromide Monohydrate (SPIRIVA RESPIMAT) 2.5 MCG/ACT AERS Use 1 to 2 puff once daily (Patient taking differently: Inhale 1 to 2 puffs  into the lungs once daily) 4 g 99  . warfarin (COUMADIN) 2 MG tablet TAKE 1 TO 2 TABLETS BY MOUTH DAILY OR AS DIRECTED 180 tablet 3   No current facility-administered medications on file prior to visit.   Past Medical History  Diagnosis Date  . Atrial fibrillation ([place])   . Bronchitis, chronic ([place])   . History of Bell's palsy   . Coronary artery disease   . Migraine headache   . Atrial flutter ([place])   . Tobacco abuse   . Osteopenia   . Cervical dysplasia   . Thyroid disease     Hypo  . Unspecified vitamin D deficiency   . Family history of anesthesia complication     Hard for daughter to wake up after Anesthesia  . Shortness of breath     with exertion  . Cystocele     either on bladder or kidneys patient is unsure but stated physician  will watch it  . Seasonal allergies   . Dizziness     If patient gets out of bed too fast  . H/O hiatal hernia   . Rosacea conjunctivitis     Right eye is worse than left eye  . Arthritis   . Gout   . Aortic stenosis     mild AS witm mild to mod AR by 11/2012 echo   Allergies  Allergen Reactions  . Advair Diskus [Fluticasone-Salmeterol] Other (See Comments)    SKIN CHANGES.  Can tolerate low dose of prednisone without complications  . Amiodarone Other (See Comments)    PULMONARY TOXICITY  . Codeine     unknown  . Diovan [Valsartan] Other (See Comments)    HYPOTENSION  . Doxycycline Diarrhea and Other (See Comments)    VISUAL DISTURBANCE  . Flexeril [Cyclobenzaprine] Other (See Comments)    FATIGUE  . Keflex [Cephalexin] Diarrhea  . Verapamil Other (See Comments)    EDEMA    Review of Systems  Constitutional: Positive for malaise/fatigue. Negative for fever, chills, weight loss and diaphoresis.  HENT: Negative.   Eyes: Positive for blurred vision, photophobia, discharge and redness. Negative for double vision and pain.  Respiratory: Positive for shortness of breath (chronic unchanged). Negative for cough, hemoptysis, sputum production and wheezing.   Cardiovascular: Negative.   Gastrointestinal: Negative.   Genitourinary: Negative.   Skin: Negative.   Neurological: Negative for weakness.     Physical: Blood pressure 120/82, pulse 76, temperature 97.9 F (36.6 C), temperature source Temporal, resp. rate 16, height 5' 3.5" (1.613 m), weight 197 lb (89.359 kg), SpO2 95 %. Filed Weights   02/12/15 0941  Weight: 197 lb (89.359 kg)    General Appearance: Well nourished, in no apparent distress. ENT/Mouth: Eyes clear discharge, some erythema. Nares clear with no erythema, swelling, mucus on turbinates. No ulcers, cracking, on lips. No erythema, swelling, or exudate on post pharynx.  Neck: Supple, thyroid normal.  Respiratory: CTAB   Cardio: Irregular, irregular rhythm,  no murmurs, rubs or gallops. No edema  Abdomen: Soft, with bowl sounds. Non tender, no guarding, rebound, hernias, masses, or organomegaly.  Skin: Warm, dry without rashes, lesions, ecchymosis.  Neuro: Unremarkable  Assessment and plan: Chronic anticoagulation- check INR and will adjust medication according to labs.  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 Discussed foods that can increase or decrease Coumadin levels. Patient understands to call the  office before starting a new medication. Follow up in one month.   Hypothyroidism -check TSH level, continue medications the same, reminded to take on an empty stomach 30-8mins before food.  Will refill 11mcg daily  Ocular Rosacea Azithromycin sent in Follow up eye doctor  Depression screen negative

## 2015-02-26 ENCOUNTER — Ambulatory Visit (INDEPENDENT_AMBULATORY_CARE_PROVIDER_SITE_OTHER): Payer: Medicare Other

## 2015-02-26 DIAGNOSIS — I4891 Unspecified atrial fibrillation: Secondary | ICD-10-CM | POA: Diagnosis not present

## 2015-02-26 LAB — PROTIME-INR
INR: 1.2 (ref <1.50)
Prothrombin Time: 15.4 seconds — ABNORMAL HIGH (ref 11.6–15.2)

## 2015-02-26 NOTE — Progress Notes (Signed)
Pt presents for PT/INR bloodwork. Pt states she take her coumadin 1mg  everyday & has no concerns or questions today.

## 2015-03-05 ENCOUNTER — Other Ambulatory Visit: Payer: Self-pay | Admitting: Internal Medicine

## 2015-03-10 ENCOUNTER — Encounter: Payer: Self-pay | Admitting: *Deleted

## 2015-03-14 ENCOUNTER — Ambulatory Visit (INDEPENDENT_AMBULATORY_CARE_PROVIDER_SITE_OTHER): Payer: Medicare Other | Admitting: Internal Medicine

## 2015-03-14 ENCOUNTER — Encounter: Payer: Self-pay | Admitting: Internal Medicine

## 2015-03-14 VITALS — BP 114/76 | HR 60 | Temp 97.7°F | Resp 16 | Ht 63.5 in | Wt 202.4 lb

## 2015-03-14 DIAGNOSIS — Z7901 Long term (current) use of anticoagulants: Secondary | ICD-10-CM

## 2015-03-14 DIAGNOSIS — D509 Iron deficiency anemia, unspecified: Secondary | ICD-10-CM

## 2015-03-14 DIAGNOSIS — E782 Mixed hyperlipidemia: Secondary | ICD-10-CM

## 2015-03-14 DIAGNOSIS — I482 Chronic atrial fibrillation, unspecified: Secondary | ICD-10-CM

## 2015-03-14 DIAGNOSIS — R7309 Other abnormal glucose: Secondary | ICD-10-CM | POA: Diagnosis not present

## 2015-03-14 DIAGNOSIS — E039 Hypothyroidism, unspecified: Secondary | ICD-10-CM

## 2015-03-14 DIAGNOSIS — I10 Essential (primary) hypertension: Secondary | ICD-10-CM | POA: Diagnosis not present

## 2015-03-14 DIAGNOSIS — E559 Vitamin D deficiency, unspecified: Secondary | ICD-10-CM | POA: Diagnosis not present

## 2015-03-14 DIAGNOSIS — Z79899 Other long term (current) drug therapy: Secondary | ICD-10-CM

## 2015-03-14 DIAGNOSIS — E60 Dietary zinc deficiency: Secondary | ICD-10-CM

## 2015-03-14 LAB — CBC WITH DIFFERENTIAL/PLATELET
BASOS ABS: 0 10*3/uL (ref 0.0–0.1)
Basophils Relative: 0 % (ref 0–1)
EOS ABS: 0.2 10*3/uL (ref 0.0–0.7)
EOS PCT: 3 % (ref 0–5)
HEMATOCRIT: 44.8 % (ref 36.0–46.0)
Hemoglobin: 15.2 g/dL — ABNORMAL HIGH (ref 12.0–15.0)
LYMPHS ABS: 2.4 10*3/uL (ref 0.7–4.0)
LYMPHS PCT: 31 % (ref 12–46)
MCH: 32.2 pg (ref 26.0–34.0)
MCHC: 33.9 g/dL (ref 30.0–36.0)
MCV: 94.9 fL (ref 78.0–100.0)
MONO ABS: 0.8 10*3/uL (ref 0.1–1.0)
MPV: 10.4 fL (ref 8.6–12.4)
Monocytes Relative: 10 % (ref 3–12)
Neutro Abs: 4.3 10*3/uL (ref 1.7–7.7)
Neutrophils Relative %: 56 % (ref 43–77)
PLATELETS: 212 10*3/uL (ref 150–400)
RBC: 4.72 MIL/uL (ref 3.87–5.11)
RDW: 14.3 % (ref 11.5–15.5)
WBC: 7.6 10*3/uL (ref 4.0–10.5)

## 2015-03-14 LAB — HEPATIC FUNCTION PANEL
ALT: 20 U/L (ref 6–29)
AST: 16 U/L (ref 10–35)
Albumin: 3.7 g/dL (ref 3.6–5.1)
Alkaline Phosphatase: 31 U/L — ABNORMAL LOW (ref 33–130)
BILIRUBIN DIRECT: 0.1 mg/dL (ref <=0.2)
BILIRUBIN INDIRECT: 0.4 mg/dL (ref 0.2–1.2)
BILIRUBIN TOTAL: 0.5 mg/dL (ref 0.2–1.2)
Total Protein: 6.2 g/dL (ref 6.1–8.1)

## 2015-03-14 LAB — BASIC METABOLIC PANEL WITH GFR
BUN: 20 mg/dL (ref 7–25)
CALCIUM: 10 mg/dL (ref 8.6–10.4)
CO2: 29 mmol/L (ref 20–31)
CREATININE: 1.16 mg/dL — AB (ref 0.60–0.93)
Chloride: 100 mmol/L (ref 98–110)
GFR, EST AFRICAN AMERICAN: 54 mL/min — AB (ref >=60)
GFR, Est Non African American: 47 mL/min — ABNORMAL LOW (ref >=60)
GLUCOSE: 90 mg/dL (ref 65–99)
Potassium: 4.8 mmol/L (ref 3.5–5.3)
Sodium: 135 mmol/L (ref 135–146)

## 2015-03-14 LAB — IRON AND TIBC
%SAT: 35 % (ref 11–50)
IRON: 125 ug/dL (ref 45–160)
TIBC: 360 ug/dL (ref 250–450)
UIBC: 235 ug/dL (ref 125–400)

## 2015-03-14 LAB — LIPID PANEL
CHOL/HDL RATIO: 4.3 ratio (ref <=5.0)
CHOLESTEROL: 152 mg/dL (ref 125–200)
HDL: 35 mg/dL — ABNORMAL LOW (ref >=46)
LDL Cholesterol: 75 mg/dL (ref <130)
TRIGLYCERIDES: 212 mg/dL — AB (ref <150)
VLDL: 42 mg/dL — AB (ref <30)

## 2015-03-14 LAB — MAGNESIUM: Magnesium: 1.9 mg/dL (ref 1.5–2.5)

## 2015-03-14 NOTE — Progress Notes (Signed)
Patient ID: Katie [name], female   DOB: [birth year], 74 y.o.   MRN: [id number]   This very nice 74 y.o. MWF presents for  follow up with Hypertension, ASHD/cAfib,  Hyperlipidemia, Pre-Diabetes and Vitamin D Deficiency.    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 Today's BP: 114/76 mmHg. Patient has had no complaints of any cardiac type chest pain, palpitations, dyspnea/orthopnea/PND, dizziness, claudication, or dependent edema.   Hyperlipidemia is controlled with diet & meds. Patient denies myalgias or other med SE's. Last Lipids were at goal with Cholesterol 154; HDL 37*; LDL 82; Triglycerides 174 on 11/13/2014.   Also, the patient has history of Morbid Obesity (BMI 35+) and consequent PreDiabetes and has had no symptoms of reactive hypoglycemia, diabetic polys, paresthesias or visual blurring.  Last A1c was  5.8% on 11/13/2014.    Further, the patient also has history of Vitamin D Deficiency of 32 in 2008 and supplements vitamin D sporadically. Last vitamin D was still low at 35 on 11/13/2014.  Medication Sig  . albuterol HFA  inhaler 2 puffs 5 minutes apart every 4 to 6 hours as needed to rescue asthma   . allopurinol  300 MG tablet TAKE 1 TABLET BY MOUTH DAILY  . aspirin 81 MG tablet Take 81 mg by mouth daily.    . benazepril  20 MG tablet TAKE 1 TABLET BY MOUTH DAILY AS NEEDED FOR BLOOD PRESSURE AND HEART  . bisoprolol-hctz (ZIAC) 5-6.25 MG tablet Take 1 tablet by mouth daily.  Marland Kitchen dexamethasone (DECADRON) 0.1 % ophthalmic suspension Place 1 drop into both eyes daily.  . digoxin (LANOXIN) 0.25 MG tablet TAKE 1 TABLET BY MOUTH DAILY  . fluconazole (DIFLUCAN) 150 MG tablet Take 1 tablet 2 x week  For yeast infection  . furosemide (LASIX) 80 MG tablet TAKE 1 TABLET BY MOUTH TWICE A DAY AS NEEDED FOR FLUID  . gabapentin  300 MG capsule Take 1 capsule (300 mg total) by  mouth 3 (three) times daily.  Marland Kitchen ipratropium (ATROVENT) 0.02 % neb soln Take 0.5 mg by nebulization every 4 (four) hours as needed for wheezing or shortness of breath.   . DUONEB 0.5-2.5 MG/3ML SOLN Use 3 cc  4 x day or every 4 hours if needed for shortness of breath.  . levothyroxine  75 MCG tablet Take 1 tablet (75 mcg total) by mouth daily.  . montelukast (SINGULAIR) 10 MG tablet Take 1 tablet (10 mg total) by mouth daily as needed. For allergy  . pravastatin (PRAVACHOL) 40 MG tablet TAKE 1 TABLET BY MOUTH AT BEDTIME FOR CHOLESTEROL  . prochlorperazine (COMPAZINE) 5 MG tablet TAKE 1 TABLET BY MOUTH 3 TIMES A DAY FOR VERTIGO OR NAUSEA  . SPIRIVA RESPIMAT 2.5  Use 1 to 2 puff once daily   . warfarin (COUMADIN) 2 MG tablet TAKE 1 TO 2 TABLETS BY MOUTH DAILY OR AS DIRECTED   Allergies  Allergen Reactions  . Advair Diskus [Fluticasone-Salmeterol] Other (See Comments)    SKIN CHANGES.  Can tolerate low dose of prednisone without complications  . Amiodarone Other (See Comments)    PULMONARY TOXICITY  . Codeine     unknown  . Diovan [Valsartan] Other (See Comments)    HYPOTENSION  . Doxycycline Diarrhea and Other (See Comments)    VISUAL DISTURBANCE  . Flexeril [Cyclobenzaprine] Other (See Comments)  FATIGUE  . Keflex [Cephalexin] Diarrhea  . Verapamil Other (See Comments)    EDEMA   PMHx:   Past Medical History  Diagnosis Date  . Atrial fibrillation ([place])   . Bronchitis, chronic ([place])   . History of Bell's palsy   . Coronary artery disease   . Migraine headache   . Atrial flutter ([place])   . Tobacco abuse   . Osteopenia   . Cervical dysplasia   . Thyroid disease     Hypo  . Unspecified vitamin D deficiency   . Family history of anesthesia complication     Hard for daughter to wake up after Anesthesia  . Shortness of breath     with exertion  . Cystocele     either on bladder or kidneys patient is unsure but stated physician will watch it  . Seasonal allergies   .  Dizziness     If patient gets out of bed too fast  . H/O hiatal hernia   . Rosacea conjunctivitis     Right eye is worse than left eye  . Arthritis   . Gout   . Aortic stenosis     mild AS witm mild to mod AR by 11/2012 echo   Immunization History  Administered Date(s) Administered  . Influenza, High Dose Seasonal PF 12/20/2013, 12/09/2014  . Influenza-Unspecified 01/02/2013  . Pneumococcal Conjugate-13 01/23/2014  . Pneumococcal-Unspecified 03/15/1993, 05/31/2008  . Td 03/16/2007  . Varicella 02/19/2008   Past Surgical History  Procedure Laterality Date  . Broken leg Left 1970s  . Laser vein procedure    . Refractive surgery    . Colonoscopy    . Cholecystectomy  1972  . Tonsillectomy    . Eye surgery Bilateral   . Cardiac catheterization    . Abdominal aortic endovascular stent graft N/A 04/11/2013    Procedure: ABDOMINAL AORTIC ENDOVASCULAR STENT GRAFT WITH RIGHT FEMORAL PATCH ANGIOPLASTY;  Surgeon: Mal Misty, MD;  Location: [facility] OR;  Service: Vascular;  Laterality: N/A;  . Breast surgery      LEFT BREAST BIOPSY   FHx:    Reviewed / unchanged  SHx:    Reviewed / unchanged  Systems Review:  Constitutional: Denies fever, chills, wt changes, headaches, insomnia, fatigue, night sweats, change in appetite. Eyes: Denies redness, blurred vision, diplopia, discharge, itchy, watery eyes.  ENT: Denies discharge, congestion, post nasal drip, epistaxis, sore throat, earache, hearing loss, dental pain, tinnitus, vertigo, sinus pain, snoring.  CV: Denies chest pain, palpitations, irregular heartbeat, syncope, dyspnea, diaphoresis, orthopnea, PND, claudication or edema. Respiratory: denies cough, dyspnea, DOE, pleurisy, hoarseness, laryngitis, wheezing.  Gastrointestinal: Denies dysphagia, odynophagia, heartburn, reflux, water brash, abdominal pain or cramps, nausea, vomiting, bloating, diarrhea, constipation, hematemesis, melena, hematochezia  or hemorrhoids. Genitourinary: Denies  dysuria, frequency, urgency, nocturia, hesitancy, discharge, hematuria or flank pain. Musculoskeletal: Denies arthralgias, myalgias, stiffness, jt. swelling, pain, limping or strain/sprain.  Skin: Denies pruritus, rash, hives, warts, acne, eczema or change in skin lesion(s). Neuro: No weakness, tremor, incoordination, spasms, paresthesia or pain. Psychiatric: Denies confusion, memory loss or sensory loss. Endo: Denies change in weight, skin or hair change.  Heme/Lymph: No excessive bleeding, bruising or enlarged lymph nodes.  Physical Exam  BP 114/76 mmHg  Pulse 60  Temp(Src) 97.7 F (36.5 C)  Resp 16  Ht 5' 3.5" (1.613 m)  Wt 202 lb 6.4 oz (91.808 kg)  BMI 35.29 kg/m2  Appears well nourished and in no distress. Eyes: PERRLA, EOMs, conjunctiva no swelling or erythema. Sinuses:  No frontal/maxillary tenderness ENT/Mouth: EAC's clear, TM's nl w/o erythema, bulging. Nares clear w/o erythema, swelling, exudates. Oropharynx clear without erythema or exudates. Oral hygiene is good. Tongue normal, non obstructing. Hearing intact.  Neck: Supple. Thyroid nl. Car 2+/2+ without bruits, nodes or JVD. Chest: Respirations nl with BS clear & equal w/o rales, rhonchi, wheezing or stridor.  Cor: Heart sounds normal w/ regular rate and rhythm without sig. murmurs, gallops, clicks, or rubs. Peripheral pulses normal and equal  without edema.  Abdomen: Soft & bowel sounds normal. Non-tender w/o guarding, rebound, hernias, masses, or organomegaly.  Lymphatics: Unremarkable.  Musculoskeletal: Full ROM all peripheral extremities, joint stability, 5/5 strength, and normal gait.  Skin: Warm, dry without exposed rashes, lesions or ecchymosis apparent.  Neuro: Cranial nerves intact, reflexes equal bilaterally. Sensory-motor testing grossly intact. Tendon reflexes grossly intact.  Pysch: Alert & oriented x 3.  Insight and judgement nl & appropriate. No ideations.  Assessment and Plan:  1. Essential  hypertension  - TSH  2. Hyperlipidemia  - Lipid panel - TSH  3. Other abnormal glucose  - Hemoglobin A1c - Insulin, random  4. Vitamin D deficiency  - VITAMIN D 25 Hydroxy  5. Chronic atrial fibrillation (HCC)   6. Hypothyroidism,   7. Long term current use of anticoagulant therapy  - Protime-INR  8. Medication management  - CBC with Differential/Platelet - BASIC METABOLIC PANEL WITH GFR - Hepatic function panel - Magnesium   Recommended regular exercise, BP monitoring, weight control, and discussed med and SE's. Recommended labs to assess and monitor clinical status. Further disposition pending results of labs. Over 30 minutes of exam, counseling, chart review was performed

## 2015-03-14 NOTE — Patient Instructions (Signed)

## 2015-03-15 LAB — PROTIME-INR
INR: 1.51 — ABNORMAL HIGH (ref <1.50)
PROTHROMBIN TIME: 18.4 s — AB (ref 11.6–15.2)

## 2015-03-15 LAB — HEMOGLOBIN A1C
Hgb A1c MFr Bld: 5.8 % — ABNORMAL HIGH (ref <5.7)
Mean Plasma Glucose: 120 mg/dL — ABNORMAL HIGH (ref <117)

## 2015-03-15 LAB — TSH: TSH: 12.048 u[IU]/mL — ABNORMAL HIGH (ref 0.350–4.500)

## 2015-03-15 LAB — VITAMIN D 25 HYDROXY (VIT D DEFICIENCY, FRACTURES): Vit D, 25-Hydroxy: 47 ng/mL (ref 30–100)

## 2015-03-15 LAB — INSULIN, RANDOM: Insulin: 10.1 u[IU]/mL (ref 2.0–19.6)

## 2015-03-18 LAB — ZINC: Zinc: 79 ug/dL (ref 60–130)

## 2015-03-20 ENCOUNTER — Other Ambulatory Visit: Payer: Self-pay | Admitting: Physician Assistant

## 2015-03-21 ENCOUNTER — Telehealth: Payer: Self-pay | Admitting: *Deleted

## 2015-03-21 ENCOUNTER — Other Ambulatory Visit: Payer: Self-pay | Admitting: Physician Assistant

## 2015-03-21 MED ORDER — AZITHROMYCIN 250 MG PO TABS: ORAL_TABLET | ORAL | Status: AC

## 2015-03-21 NOTE — Telephone Encounter (Signed)
Patient called requesting Abx for sinus infection.  Per Dr. Dr. Idell Pickles orders Zpak sent into pharmacy for patient.  Advised her to complete abx and if no relief from symptoms will need f/u ov for further E/T.

## 2015-03-31 DIAGNOSIS — M1712 Unilateral primary osteoarthritis, left knee: Secondary | ICD-10-CM | POA: Diagnosis not present

## 2015-04-07 ENCOUNTER — Ambulatory Visit: Payer: Medicare Other | Admitting: Internal Medicine

## 2015-04-07 ENCOUNTER — Encounter: Payer: Self-pay | Admitting: Internal Medicine

## 2015-04-07 VITALS — BP 116/72 | HR 68 | Temp 98.0°F | Resp 18 | Ht 63.5 in | Wt 205.0 lb

## 2015-04-07 DIAGNOSIS — J329 Chronic sinusitis, unspecified: Secondary | ICD-10-CM | POA: Diagnosis not present

## 2015-04-07 DIAGNOSIS — H6122 Impacted cerumen, left ear: Secondary | ICD-10-CM | POA: Diagnosis not present

## 2015-04-07 MED ORDER — LEVOFLOXACIN 750 MG PO TABS: 750.0000 mg | ORAL_TABLET | Freq: Every day | ORAL | Status: DC

## 2015-04-07 MED ORDER — DEXAMETHASONE SODIUM PHOSPHATE 100 MG/10ML IJ SOLN
10.0000 mg | Freq: Once | INTRAMUSCULAR | Status: AC
  Administered 2015-04-07: 10 mg via INTRAMUSCULAR

## 2015-04-07 MED ORDER — IPRATROPIUM BROMIDE 0.03 % NA SOLN: 2.0000 | Freq: Two times a day (BID) | NASAL | Status: DC

## 2015-04-07 MED ORDER — PREDNISONE 20 MG PO TABS: ORAL_TABLET | ORAL | Status: DC

## 2015-04-07 NOTE — Progress Notes (Signed)
Patient ID: Katie [name], female   DOB: 1940-08-23, 75 y.o.   MRN: [id number]  HPI  Patient presents to the office for evaluation of dizziness, lightheadedness.  It has been going on for 4 days.  Patient reports no cough.  They also endorse change in voice, chills, postnasal drip and nasal congestion,  sinus pressure, ear congestion.  .  They have tried mucinex.  They report that nothing has worked.  They denies other sick contacts.  She did most recently take a zpak which she start on 03/21/15.     Review of Systems  Constitutional: Positive for chills and malaise/fatigue. Negative for fever.  HENT: Positive for congestion and ear pain. Negative for sore throat.   Eyes: Negative.   Respiratory: Negative for cough, shortness of breath and wheezing.   Cardiovascular: Negative for chest pain, palpitations and leg swelling.  Neurological: Positive for headaches.    PE:  Filed Vitals:   04/07/15 1117  BP: 116/72  Pulse: 68  Temp: 98 F (36.7 C)  Resp: 18   General:  Alert and non-toxic, WDWN, NAD HEENT: NCAT, PERLA, EOM normal, no occular discharge or erythema.  Nasal mucosal edema with sinus tenderness to palpation.  Oropharynx clear with minimal oropharyngeal edema and erythema.  Mucous membranes moist and pink. Neck:  Cervical adenopathy Chest:  RRR no MRGs.  Lungs clear to auscultation A&P with no wheezes rhonchi or rales.   Abdomen: +BS x 4 quadrants, soft, non-tender, no guarding, rigidity, or rebound. Skin: warm and dry no rash Neuro: A&Ox4, CN II-XII grossly intact  Assessment and Plan:   1. Chronic sinusitis, unspecified location -zyrtec -nasal saline -decadron shot - ipratropium (ATROVENT) 0.03 % nasal spray; Place 2 sprays into both nostrils every 12 (twelve) hours.  Dispense: 30 mL; Refill: 12 - predniSONE (DELTASONE) 20 MG tablet; 3 tabs po daily x 3 days, then 2 tabs x 3 days, then 1.5 tabs x 3 days, then 1 tab x 3 days, then 0.5 tabs x 3 days  Dispense: 27 tablet;  Refill: 0 - levofloxacin (LEVAQUIN) 750 MG tablet; Take 1 tablet (750 mg total) by mouth daily. X 7 days  Dispense: 7 tablet; Refill: 0 - Ambulatory referral to ENT

## 2015-04-07 NOTE — Addendum Note (Signed)
Addended by: Devanie Galanti A on: 04/07/2015 04:54 PM   Modules accepted: Orders

## 2015-04-07 NOTE — Patient Instructions (Addendum)
Please start taking levaquin daily until it is gone. Take with food.  Please use saline in your nose as often as you can tolerate it.  Please take claritin, zyrtec, or allegra daily.  I'll give you a coupon.  Please take prednisone until it is gone.  Cut your coumadin in half when you are on the antibiotic.    Use atrovent nasal spray twice daily.  You can take 50 mg of benadryl at nighttime if you need help sleeping.

## 2015-04-09 DIAGNOSIS — J018 Other acute sinusitis: Secondary | ICD-10-CM | POA: Diagnosis not present

## 2015-04-09 DIAGNOSIS — H6523 Chronic serous otitis media, bilateral: Secondary | ICD-10-CM | POA: Diagnosis not present

## 2015-04-16 ENCOUNTER — Encounter: Payer: Self-pay | Admitting: Vascular Surgery

## 2015-04-17 ENCOUNTER — Ambulatory Visit: Payer: Self-pay | Admitting: Internal Medicine

## 2015-04-18 ENCOUNTER — Encounter: Payer: Self-pay | Admitting: Internal Medicine

## 2015-04-18 ENCOUNTER — Ambulatory Visit (INDEPENDENT_AMBULATORY_CARE_PROVIDER_SITE_OTHER): Payer: Medicare Other | Admitting: Internal Medicine

## 2015-04-18 VITALS — BP 112/62 | HR 60 | Temp 98.2°F | Resp 18 | Ht 63.5 in | Wt 202.0 lb

## 2015-04-18 DIAGNOSIS — R42 Dizziness and giddiness: Secondary | ICD-10-CM

## 2015-04-18 DIAGNOSIS — Z7901 Long term (current) use of anticoagulants: Secondary | ICD-10-CM | POA: Diagnosis not present

## 2015-04-18 DIAGNOSIS — I480 Paroxysmal atrial fibrillation: Secondary | ICD-10-CM

## 2015-04-18 DIAGNOSIS — I4891 Unspecified atrial fibrillation: Secondary | ICD-10-CM | POA: Diagnosis not present

## 2015-04-18 DIAGNOSIS — Z79899 Other long term (current) drug therapy: Secondary | ICD-10-CM | POA: Diagnosis not present

## 2015-04-18 LAB — BASIC METABOLIC PANEL
BUN: 27 mg/dL — ABNORMAL HIGH (ref 7–25)
CALCIUM: 9.5 mg/dL (ref 8.6–10.4)
CHLORIDE: 105 mmol/L (ref 98–110)
CO2: 29 mmol/L (ref 20–31)
CREATININE: 1.5 mg/dL — AB (ref 0.60–0.93)
GLUCOSE: 70 mg/dL (ref 65–99)
Potassium: 4 mmol/L (ref 3.5–5.3)
Sodium: 142 mmol/L (ref 135–146)

## 2015-04-18 LAB — PROTIME-INR
INR: 1.01 (ref <1.50)
Prothrombin Time: 13.4 seconds (ref 11.6–15.2)

## 2015-04-18 MED ORDER — MECLIZINE HCL 25 MG PO TABS: 25.0000 mg | ORAL_TABLET | Freq: Three times a day (TID) | ORAL | Status: DC | PRN

## 2015-04-18 MED ORDER — CLOTRIMAZOLE 1 % VA CREA: 1.0000 | TOPICAL_CREAM | Freq: Every day | VAGINAL | Status: DC

## 2015-04-18 NOTE — Progress Notes (Signed)
Patient ID: GENISIS YURCHAK, female   DOB: Aug 24, 1940, 75 y.o.   MRN: [id number]  Assessment and Plan:   1. Dizziness and giddiness -seondary to chronic sinusitis - meclizine (ANTIVERT) 25 MG tablet; Take 1 tablet (25 mg total) by mouth 3 (three) times daily as needed for dizziness.  Dispense: 60 tablet; Refill: 1  2. Long term current use of anticoagulant therapy -cont to coumadin - Protime-INR  3. Paroxysmal atrial fibrillation (HCC) -cont coumadin - Protime-INR  4. Medication management -needed for upcoming CT angio abdomen - Basic metabolic panel     HPI 75 y.o.female presents for 1 month follow up of PT INR. Patient reports that they have been doing well.  female is taking their medication.  They are not having difficulty with their medications.  They report no adverse reactions.  She is currently on a 6 week course of augmentin for chronic sinusitis.  She reports that she is afraid she will get a yeast infection.  She reports that she needs something for it.  No falls, no blood in stools, no black colored stools.  No nose bleeds.      Past Medical History  Diagnosis Date  . Atrial fibrillation ([place])   . Bronchitis, chronic ([place])   . History of Bell's palsy   . Coronary artery disease   . Migraine headache   . Atrial flutter ([place])   . Tobacco abuse   . Osteopenia   . Cervical dysplasia   . Thyroid disease     Hypo  . Unspecified vitamin D deficiency   . Family history of anesthesia complication     Hard for daughter to wake up after Anesthesia  . Shortness of breath     with exertion  . Cystocele     either on bladder or kidneys patient is unsure but stated physician will watch it  . Seasonal allergies   . Dizziness     If patient gets out of bed too fast  . H/O hiatal hernia   . Rosacea conjunctivitis     Right eye is worse than left eye  . Arthritis   . Gout   . Aortic stenosis     mild AS witm mild to mod AR by 11/2012 echo     Allergies  Allergen  Reactions  . Advair Diskus [Fluticasone-Salmeterol] Other (See Comments)    SKIN CHANGES.  Can tolerate low dose of prednisone without complications  . Amiodarone Other (See Comments)    PULMONARY TOXICITY  . Codeine     unknown  . Diovan [Valsartan] Other (See Comments)    HYPOTENSION  . Doxycycline Diarrhea and Other (See Comments)    VISUAL DISTURBANCE  . Flexeril [Cyclobenzaprine] Other (See Comments)    FATIGUE  . Keflex [Cephalexin] Diarrhea  . Verapamil Other (See Comments)    EDEMA      Current Outpatient Prescriptions on File Prior to Visit  Medication Sig Dispense Refill  . allopurinol (ZYLOPRIM) 300 MG tablet TAKE 1 TABLET BY MOUTH DAILY 90 tablet 1  . aspirin 81 MG tablet Take 81 mg by mouth daily.      . benazepril (LOTENSIN) 20 MG tablet TAKE 1 TABLET BY MOUTH DAILY AS NEEDED FOR BLOOD PRESSURE AND HEART 90 tablet 2  . bisoprolol-hydrochlorothiazide (ZIAC) 5-6.25 MG tablet Take 1 tablet by mouth daily. 90 tablet 3  . dexamethasone (DECADRON) 0.1 % ophthalmic suspension Place 1 drop into both eyes daily.    . digoxin (LANOXIN) 0.25 MG tablet  TAKE 1 TABLET BY MOUTH DAILY 90 tablet 3  . furosemide (LASIX) 80 MG tablet TAKE 1 TABLET BY MOUTH TWICE A DAY AS NEEDED FOR FLUID 180 tablet 1  . gabapentin (NEURONTIN) 300 MG capsule Take 1 capsule (300 mg total) by mouth 3 (three) times daily. 270 capsule 3  . ipratropium (ATROVENT) 0.02 % nebulizer solution Take 0.5 mg by nebulization every 4 (four) hours as needed for wheezing or shortness of breath.     Marland Kitchen ipratropium (ATROVENT) 0.03 % nasal spray Place 2 sprays into both nostrils every 12 (twelve) hours. 30 mL 12  . ipratropium-albuterol (DUONEB) 0.5-2.5 (3) MG/3ML SOLN Use 3 cc  4 x day or every 4 hours if needed for shortness of breath. 360 mL 99  . levothyroxine (SYNTHROID, LEVOTHROID) 75 MCG tablet Take 1 tablet (75 mcg total) by mouth daily. 90 tablet 1  . montelukast (SINGULAIR) 10 MG tablet Take 1 tablet (10 mg total) by  mouth daily as needed. For allergy 90 tablet 3  . pravastatin (PRAVACHOL) 40 MG tablet TAKE 1 TABLET BY MOUTH AT BEDTIME FOR CHOLESTEROL 90 tablet 1  . prochlorperazine (COMPAZINE) 5 MG tablet TAKE 1 TABLET BY MOUTH 3 TIMES A DAY FOR VERTIGO OR NAUSEA 90 tablet 0  . Tiotropium Bromide Monohydrate (SPIRIVA RESPIMAT) 2.5 MCG/ACT AERS Use 1 to 2 puff once daily (Patient taking differently: Inhale 1 to 2 puffs into the lungs once daily) 4 g 99  . warfarin (COUMADIN) 2 MG tablet TAKE 1 TO 2 TABLETS BY MOUTH DAILY OR AS DIRECTED 180 tablet 3  . albuterol (PROVENTIL HFA;VENTOLIN HFA) 108 (90 BASE) MCG/ACT inhaler 2 puffs 5 minutes apart every 4 to 6 hours as needed to rescue asthma (Patient taking differently: Inhale 2 puffs into the lungs 5 minutes apart every 4 to 6 hours as needed to rescue asthma) 1 Inhaler 11   No current facility-administered medications on file prior to visit.    ROS: all negative except above.   Physical Exam: Filed Weights   04/18/15 0912  Weight: 202 lb (91.627 kg)   BP 112/62 mmHg  Pulse 60  Temp(Src) 98.2 F (36.8 C) (Temporal)  Resp 18  Ht 5' 3.5" (1.613 m)  Wt 202 lb (91.627 kg)  BMI 35.22 kg/m2 General Appearance: Well developed well nourished, non-toxic appearing in no apparent distress. Eyes: PERRLA, EOMs, conjunctiva w/ no swelling or erythema or discharge Sinuses: No Frontal/maxillary tenderness ENT/Mouth: Ear canals clear without swelling or erythema.  TM's normal bilaterally with no retractions, bulging, or loss of landmarks.   Neck: Supple, thyroid normal, no notable JVD  Respiratory: Respiratory effort normal, Clear breath sounds anteriorly and posteriorly bilaterally without rales, rhonchi, wheezing or stridor. No retractions or accessory muscle usage. Cardio: ireg  with no RGs.   Abdomen: Soft, + BS.  Non tender, no guarding, rebound, hernias, masses.  Musculoskeletal: Full ROM, 5/5 strength, normal gait.  Skin: Warm, dry without rashes  Neuro:  Awake and oriented X 3, Cranial nerves intact. Normal muscle tone, no cerebellar symptoms. Sensation intact.  Psych: normal affect, Insight and Judgment appropriate.     Starlyn Skeans, PA-C 9:35 AM [facility]

## 2015-04-21 ENCOUNTER — Other Ambulatory Visit: Payer: Self-pay | Admitting: *Deleted

## 2015-04-21 ENCOUNTER — Other Ambulatory Visit: Payer: Self-pay | Admitting: Otolaryngology

## 2015-04-21 ENCOUNTER — Ambulatory Visit: Payer: Self-pay

## 2015-04-21 ENCOUNTER — Telehealth: Payer: Self-pay | Admitting: Vascular Surgery

## 2015-04-21 DIAGNOSIS — R51 Headache: Secondary | ICD-10-CM

## 2015-04-21 DIAGNOSIS — I714 Abdominal aortic aneurysm, without rupture, unspecified: Secondary | ICD-10-CM

## 2015-04-21 DIAGNOSIS — Z95828 Presence of other vascular implants and grafts: Secondary | ICD-10-CM

## 2015-04-21 DIAGNOSIS — R0981 Nasal congestion: Secondary | ICD-10-CM

## 2015-04-21 DIAGNOSIS — R519 Headache, unspecified: Secondary | ICD-10-CM

## 2015-04-21 NOTE — Telephone Encounter (Signed)
Katie [name] from [facility] called. The radiologist is concerned about pts GFR- wants to know what we would like to do about her CTA that is scheduled for tomorrow.  Please call Katie [name] at [phone] with update.

## 2015-04-21 NOTE — Telephone Encounter (Signed)
Per Dr. Kellie Simmering, all right to do CT abdomen/ pelvis without contrast. I called Katie [name] and made her aware. I will also tell Katie [name] for any changes in precert.

## 2015-04-22 ENCOUNTER — Ambulatory Visit
Admission: RE | Admit: 2015-04-22 | Discharge: 2015-04-22 | Disposition: A | Discharge status: Home / Self Care | Payer: Medicare Other | Source: Ambulatory Visit | Attending: Vascular Surgery | Admitting: Vascular Surgery

## 2015-04-22 ENCOUNTER — Ambulatory Visit (INDEPENDENT_AMBULATORY_CARE_PROVIDER_SITE_OTHER): Payer: Medicare Other | Admitting: Vascular Surgery

## 2015-04-22 ENCOUNTER — Encounter: Payer: Self-pay | Admitting: Vascular Surgery

## 2015-04-22 VITALS — BP 113/66 | HR 84 | Temp 96.0°F | Resp 16 | Ht 63.5 in | Wt 200.0 lb

## 2015-04-22 DIAGNOSIS — I714 Abdominal aortic aneurysm, without rupture, unspecified: Secondary | ICD-10-CM | POA: Insufficient documentation

## 2015-04-22 DIAGNOSIS — K760 Fatty (change of) liver, not elsewhere classified: Secondary | ICD-10-CM | POA: Diagnosis not present

## 2015-04-22 DIAGNOSIS — Z95828 Presence of other vascular implants and grafts: Secondary | ICD-10-CM

## 2015-04-22 NOTE — Progress Notes (Signed)
Subjective:     Patient ID: Katie [name], female   DOB: 11-Feb-1941, 75 y.o.   MRN: [id number]  HPI This 75 year old female returns for continued follow-up regarding her abdominal aortic aneurysm stent graft repair performed in 2015. Patient has no new symptoms of abdominal or back discomfort. Today she had a CT angiogram performed which I have reviewed and interpreted. This reveals aneurysm sac to be contracted down nicely to 3.2 cm in maximum diameter around the graft. Graft is in excellent position. She denies specific claudication symptoms but does have weakness in her right leg. At times she states it is difficult to lift this against gravity. She also has chronic edema in both ankles and takes a diuretic on a daily basis.   Past Medical History  Diagnosis Date  . Atrial fibrillation ([place])   . Bronchitis, chronic ([place])   . History of Bell's palsy   . Coronary artery disease   . Migraine headache   . Atrial flutter ([place])   . Tobacco abuse   . Osteopenia   . Cervical dysplasia   . Thyroid disease     Hypo  . Unspecified vitamin D deficiency   . Family history of anesthesia complication     Hard for daughter to wake up after Anesthesia  . Shortness of breath     with exertion  . Cystocele     either on bladder or kidneys patient is unsure but stated physician will watch it  . Seasonal allergies   . Dizziness     If patient gets out of bed too fast  . H/O hiatal hernia   . Rosacea conjunctivitis     Right eye is worse than left eye  . Arthritis   . Gout   . Aortic stenosis     mild AS witm mild to mod AR by 11/2012 echo    Social History  Substance Use Topics  . Smoking status: Former Smoker    Types: Cigarettes    Quit date: 08/10/2002  . Smokeless tobacco: Never Used     Comment: History of tobacco abuse  . Alcohol Use: 0.0 oz/week    0 Standard drinks or equivalent per week     Comment: rare    Family History  Problem Relation Age of Onset  . Cirrhosis Mother    . Cancer Mother 56    PANCREAS  . Heart defect Sister   . Breast cancer Sister     age 85  . Heart disease Sister   . Stroke Sister   . Alcohol abuse Father   . Depression Father   . Hypertension Brother   . Hyperlipidemia Son   . Heart disease Daughter     Allergies  Allergen Reactions  . Advair Diskus [Fluticasone-Salmeterol] Other (See Comments)    SKIN CHANGES.  Can tolerate low dose of prednisone without complications  . Amiodarone Other (See Comments)    PULMONARY TOXICITY  . Codeine     unknown  . Diovan [Valsartan] Other (See Comments)    HYPOTENSION  . Doxycycline Diarrhea and Other (See Comments)    VISUAL DISTURBANCE  . Flexeril [Cyclobenzaprine] Other (See Comments)    FATIGUE  . Keflex [Cephalexin] Diarrhea  . Verapamil Other (See Comments)    EDEMA     Current outpatient prescriptions:  .  allopurinol (ZYLOPRIM) 300 MG tablet, TAKE 1 TABLET BY MOUTH DAILY, Disp: 90 tablet, Rfl: 1 .  aspirin 81 MG tablet, Take 81 mg by mouth daily.  ,  Disp: , Rfl:  .  benazepril (LOTENSIN) 20 MG tablet, TAKE 1 TABLET BY MOUTH DAILY AS NEEDED FOR BLOOD PRESSURE AND HEART, Disp: 90 tablet, Rfl: 2 .  bisoprolol-hydrochlorothiazide (ZIAC) 5-6.25 MG tablet, Take 1 tablet by mouth daily., Disp: 90 tablet, Rfl: 3 .  dexamethasone (DECADRON) 0.1 % ophthalmic suspension, Place 1 drop into both eyes daily., Disp: , Rfl:  .  digoxin (LANOXIN) 0.25 MG tablet, TAKE 1 TABLET BY MOUTH DAILY, Disp: 90 tablet, Rfl: 3 .  furosemide (LASIX) 80 MG tablet, TAKE 1 TABLET BY MOUTH TWICE A DAY AS NEEDED FOR FLUID, Disp: 180 tablet, Rfl: 1 .  gabapentin (NEURONTIN) 300 MG capsule, Take 1 capsule (300 mg total) by mouth 3 (three) times daily., Disp: 270 capsule, Rfl: 3 .  ipratropium (ATROVENT) 0.02 % nebulizer solution, Take 0.5 mg by nebulization every 4 (four) hours as needed for wheezing or shortness of breath. , Disp: , Rfl:  .  ipratropium (ATROVENT) 0.03 % nasal spray, Place 2 sprays into both  nostrils every 12 (twelve) hours., Disp: 30 mL, Rfl: 12 .  ipratropium-albuterol (DUONEB) 0.5-2.5 (3) MG/3ML SOLN, Use 3 cc  4 x day or every 4 hours if needed for shortness of breath., Disp: 360 mL, Rfl: 99 .  levothyroxine (SYNTHROID, LEVOTHROID) 75 MCG tablet, Take 1 tablet (75 mcg total) by mouth daily., Disp: 90 tablet, Rfl: 1 .  meclizine (ANTIVERT) 25 MG tablet, Take 1 tablet (25 mg total) by mouth 3 (three) times daily as needed for dizziness., Disp: 60 tablet, Rfl: 1 .  montelukast (SINGULAIR) 10 MG tablet, Take 1 tablet (10 mg total) by mouth daily as needed. For allergy, Disp: 90 tablet, Rfl: 3 .  pravastatin (PRAVACHOL) 40 MG tablet, TAKE 1 TABLET BY MOUTH AT BEDTIME FOR CHOLESTEROL, Disp: 90 tablet, Rfl: 1 .  prochlorperazine (COMPAZINE) 5 MG tablet, TAKE 1 TABLET BY MOUTH 3 TIMES A DAY FOR VERTIGO OR NAUSEA, Disp: 90 tablet, Rfl: 0 .  Tiotropium Bromide Monohydrate (SPIRIVA RESPIMAT) 2.5 MCG/ACT AERS, Use 1 to 2 puff once daily (Patient taking differently: Inhale 1 to 2 puffs into the lungs once daily), Disp: 4 g, Rfl: 99 .  warfarin (COUMADIN) 2 MG tablet, TAKE 1 TO 2 TABLETS BY MOUTH DAILY OR AS DIRECTED, Disp: 180 tablet, Rfl: 3 .  albuterol (PROVENTIL HFA;VENTOLIN HFA) 108 (90 BASE) MCG/ACT inhaler, 2 puffs 5 minutes apart every 4 to 6 hours as needed to rescue asthma (Patient taking differently: Inhale 2 puffs into the lungs 5 minutes apart every 4 to 6 hours as needed to rescue asthma), Disp: 1 Inhaler, Rfl: 11 .  clotrimazole (GYNE-LOTRIMIN) 1 % vaginal cream, Place 1 Applicatorful vaginally at bedtime. (Patient not taking: Reported on 04/22/2015), Disp: 45 g, Rfl: 0  Filed Vitals:   04/22/15 1259  BP: 113/66  Pulse: 84  Temp: 96 F (35.6 C)  Resp: 16  Height: 5' 3.5" (1.613 m)  Weight: 200 lb (90.719 kg)  SpO2: 98%    Body mass index is 34.87 kg/(m^2).          Review of Systems Denies chest pain , dyspnea on exertion. Does have some low back pain and weakness  and right leg at times. It is not consistent. She is never had  A back evaluation. Has chronic A. Fib on Coumadin. Denies other symptoms and a complete revie of systems     Objective:   Physical Exam BP 113/66 mmHg  Pulse 84  Temp(Src) 96 F (35.6 C)  Resp 16  Ht 5' 3.5" (1.613 m)  Wt 200 lb (90.719 kg)  BMI 34.87 kg/m2  SpO2 98%  Gen.-alert and oriented x3 in no apparent distress HEENT normal for age Lungs no rhonchi or wheezing Cardiovascular regular rhythm no murmurs carotid pulses 3+ palpable no bruits audible Abdomen soft nontender no palpable masses -obese Musculoskeletal free of  major deformities Skin clear -no rashes Neurologic normal Lower extremities 3+ femoral and dorsalis pedis pulses palpable bilaterally with no edema   Today I ordered a the scan of the abdomen and pelvis without contrast. I reviewed this by computer and also reviewed the report by the radiologist. The aneurysm sac has continued to contract around the graft now being 3.2 cm in diameter. There's been no migration of the graft or any rupture of the stents.      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 Melford Aase  About possible referral  ABIs in the past and then 1.0 or greater Patient return in 1 year with duplex scan of aortic aneurysm stent graft in our office and recheck ABIs

## 2015-04-22 NOTE — Addendum Note (Signed)
Addended by: Dorthula Rue L on: 04/22/2015 02:07 PM   Modules accepted: Orders

## 2015-04-23 ENCOUNTER — Encounter: Payer: Self-pay | Admitting: Cardiology

## 2015-04-24 ENCOUNTER — Other Ambulatory Visit: Payer: Self-pay | Admitting: Internal Medicine

## 2015-04-25 ENCOUNTER — Telehealth: Payer: Self-pay | Admitting: *Deleted

## 2015-04-25 DIAGNOSIS — Z7901 Long term (current) use of anticoagulants: Secondary | ICD-10-CM

## 2015-04-25 DIAGNOSIS — Z79899 Other long term (current) drug therapy: Secondary | ICD-10-CM

## 2015-04-25 NOTE — Telephone Encounter (Signed)
Per Dr Melford Aase, continue the Lasix 40 mg 2 tabs in the morning.  Per Dr Melford Aase, check a BMP at 05/06/2015 [place].

## 2015-04-30 DIAGNOSIS — H01025 Squamous blepharitis left lower eyelid: Secondary | ICD-10-CM | POA: Diagnosis not present

## 2015-04-30 DIAGNOSIS — H01024 Squamous blepharitis left upper eyelid: Secondary | ICD-10-CM | POA: Diagnosis not present

## 2015-04-30 DIAGNOSIS — H01021 Squamous blepharitis right upper eyelid: Secondary | ICD-10-CM | POA: Diagnosis not present

## 2015-04-30 DIAGNOSIS — H01022 Squamous blepharitis right lower eyelid: Secondary | ICD-10-CM | POA: Diagnosis not present

## 2015-04-30 DIAGNOSIS — H16101 Unspecified superficial keratitis, right eye: Secondary | ICD-10-CM | POA: Diagnosis not present

## 2015-05-06 ENCOUNTER — Ambulatory Visit (INDEPENDENT_AMBULATORY_CARE_PROVIDER_SITE_OTHER): Payer: Medicare Other | Admitting: Internal Medicine

## 2015-05-06 ENCOUNTER — Ambulatory Visit: Payer: Self-pay

## 2015-05-06 VITALS — BP 112/64 | HR 70 | Temp 98.2°F | Resp 16 | Ht 63.5 in

## 2015-05-06 DIAGNOSIS — R609 Edema, unspecified: Secondary | ICD-10-CM | POA: Diagnosis not present

## 2015-05-06 MED ORDER — POTASSIUM CHLORIDE CRYS ER 20 MEQ PO TBCR: 40.0000 meq | EXTENDED_RELEASE_TABLET | Freq: Every day | ORAL | Status: DC

## 2015-05-06 MED ORDER — TRIAMCINOLONE ACETONIDE 0.1 % EX CREA: 1.0000 "application " | TOPICAL_CREAM | Freq: Three times a day (TID) | CUTANEOUS | Status: DC

## 2015-05-06 MED ORDER — FUROSEMIDE 80 MG PO TABS: ORAL_TABLET | ORAL | Status: DC

## 2015-05-06 MED ORDER — METOLAZONE 2.5 MG PO TABS: 2.5000 mg | ORAL_TABLET | Freq: Every day | ORAL | Status: DC

## 2015-05-06 NOTE — Progress Notes (Signed)
   Subjective:    Patient ID: Katie [name], female    DOB: 04-28-1940, 75 y.o.   MRN: [id number]  HPI  Patient presents to the office for evaluation of left foot swelling and itching that has been going on for several days.  She notes that she has had increasing swelling despite having increased her lasix to 80 mg daily.  She reports that she has also been putting her legs out when she sits down.  She has had some redness at the bases of her toes and a small out of rash.  She did not see any bugs, change any personal products or increase her salt intake.  NO bugs noted.  No fevers, chills, nausea, vomiting.    Review of Systems  Constitutional: Negative for fever, chills and fatigue.  Respiratory: Negative for cough, chest tightness and shortness of breath.   Cardiovascular: Positive for leg swelling.  Skin: Positive for color change and rash.       Objective:   Physical Exam  Constitutional: She is oriented to person, place, and time. She appears well-developed and well-nourished. No distress.  HENT:  Head: Normocephalic.  Mouth/Throat: Oropharynx is clear and moist. No oropharyngeal exudate.  Eyes: Conjunctivae are normal. No scleral icterus.  Neck: Normal range of motion. Neck supple. No JVD present. No thyromegaly present.  Cardiovascular: Normal rate, regular rhythm, normal heart sounds and intact distal pulses.  Exam reveals no gallop and no friction rub.   No murmur heard. Pulmonary/Chest: Effort normal and breath sounds normal. No respiratory distress. She has no wheezes. She has no rales. She exhibits no tenderness.  Lymphadenopathy:    She has no cervical adenopathy.  Neurological: She is alert and oriented to person, place, and time.  Skin: Skin is warm and dry. No rash noted. She is not diaphoretic. No erythema. No pallor.  Increased redness and non-pitting peripheral edema to the mid tibia to the toes.  No palpable warmth or petchia noted.    Psychiatric: She has a  normal mood and affect. Her behavior is normal. Judgment and thought content normal.  Nursing note and vitals reviewed.   Filed Vitals:   05/06/15 1600  BP: 112/64  Pulse: 70  Temp: 98.2 F (36.8 C)  Resp: 16         Assessment & Plan:    1. Peripheral edema -lasix 80 mg -zaroxolyn 2.5 mg x 1 day -weight daily -40 mEq potassium -compression socks -kenalog for itching -call for signs of cellulitis

## 2015-05-06 NOTE — Patient Instructions (Addendum)
Please weight yourself in the morning.   Please take 80 mg of lasix and 2.5 mg of the zaroxolyn.  You will need to be close to a bathroom.  When you take both the fluid pills please take 40 mEq of the the potassium.  Please make sure you elevate your legs above your heart.  Go back to 80 mg of lasix daily.  Weight yourself the next day.  Please record both weights so we can see the change in fluid.    Please wear diabetic compression socks when you are up during the day time.  They can come off in the evening.  Use steroid cream as needed on your feet for itching.    Call if you develop severe redness, warmth, fever, chills, nausea, or vomiting.

## 2015-05-10 ENCOUNTER — Other Ambulatory Visit: Payer: Self-pay | Admitting: Internal Medicine

## 2015-05-12 DIAGNOSIS — M1712 Unilateral primary osteoarthritis, left knee: Secondary | ICD-10-CM | POA: Diagnosis not present

## 2015-05-12 DIAGNOSIS — M1711 Unilateral primary osteoarthritis, right knee: Secondary | ICD-10-CM | POA: Diagnosis not present

## 2015-05-14 ENCOUNTER — Other Ambulatory Visit: Payer: Self-pay | Admitting: Internal Medicine

## 2015-05-26 ENCOUNTER — Other Ambulatory Visit: Payer: Self-pay | Admitting: Internal Medicine

## 2015-05-30 ENCOUNTER — Ambulatory Visit (INDEPENDENT_AMBULATORY_CARE_PROVIDER_SITE_OTHER): Payer: Medicare Other | Admitting: Internal Medicine

## 2015-05-30 ENCOUNTER — Encounter: Payer: Self-pay | Admitting: Internal Medicine

## 2015-05-30 VITALS — BP 104/70 | HR 64 | Temp 97.7°F | Resp 16 | Ht 63.5 in | Wt 197.8 lb

## 2015-05-30 DIAGNOSIS — Z7901 Long term (current) use of anticoagulants: Secondary | ICD-10-CM | POA: Diagnosis not present

## 2015-05-30 DIAGNOSIS — I482 Chronic atrial fibrillation, unspecified: Secondary | ICD-10-CM

## 2015-05-30 DIAGNOSIS — J320 Chronic maxillary sinusitis: Secondary | ICD-10-CM

## 2015-05-30 DIAGNOSIS — N390 Urinary tract infection, site not specified: Secondary | ICD-10-CM

## 2015-05-30 DIAGNOSIS — I10 Essential (primary) hypertension: Secondary | ICD-10-CM | POA: Diagnosis not present

## 2015-05-30 LAB — PROTIME-INR
INR: 1.3 (ref <1.50)
Prothrombin Time: 16.4 seconds — ABNORMAL HIGH (ref 11.6–15.2)

## 2015-05-30 MED ORDER — SULFAMETHOXAZOLE-TRIMETHOPRIM 800-160 MG PO TABS
ORAL_TABLET | ORAL | Status: AC
Start: 2015-05-30 — End: 2015-06-14

## 2015-05-30 NOTE — Patient Instructions (Addendum)
Cut Coumadin dose while on Sulfa Drug  +++++++++++++++++++++++++++++++++++++++++++++++++++++++ Recommend Adult Low Dose Aspirin or   coated  Aspirin 81 mg daily   To reduce risk of Colon Cancer 20 %,   Skin Cancer 26 % ,   Melanoma 46%   and   Pancreatic cancer 60%   ++++++++++++++++++++++++++++++++++++++++++++++++++++++ Vitamin D goal   is between 70-100.   Please make sure that you are taking your Vitamin D as directed.   It is very important as a natural anti-inflammatory   helping hair, skin, and nails, as well as reducing stroke and heart attack risk.   It helps your bones and helps with mood.  It also decreases numerous cancer risks so please take it as directed.   Low Vit D is associated with a 200-300% higher risk for CANCER   and 200-300% higher risk for HEART   ATTACK  &  STROKE.   .....................................Marland Kitchen  It is also associated with higher death rate at younger ages,   autoimmune diseases like Rheumatoid arthritis, Lupus, Multiple Sclerosis.     Also many other serious conditions, like depression, Alzheimer's  Dementia, infertility, muscle aches, fatigue, fibromyalgia - just to name a few.  ++++++++++++++++++++++++++++++++++++++++++++++++  Recommend the book "The END of DIETING" by Dr Excell Seltzer   & the book "The END of DIABETES " by Dr Excell Seltzer  At [email or web address] - get book & Audio CD's     Being diabetic has a  300% increased risk for heart attack, stroke, cancer, and alzheimer- type vascular dementia. It is very important that you work harder with diet by avoiding all foods that are white. Avoid white rice (brown & wild rice is OK), white potatoes (sweetpotatoes in moderation is OK), White bread or wheat bread or anything made out of white flour like bagels, donuts, rolls, buns, biscuits, cakes, pastries, cookies, pizza crust, and pasta (made from white flour & egg whites) - vegetarian pasta or spinach or wheat pasta is OK.  Multigrain breads like Arnold's or Pepperidge [employer], or multigrain sandwich thins or flatbreads.  Diet, exercise and weight loss can reverse and cure diabetes in the early stages.  Diet, exercise and weight loss is very important in the control and prevention of complications of diabetes which affects every system in your body, ie. Brain - dementia/stroke, eyes - glaucoma/blindness, heart - heart attack/heart failure, kidneys - dialysis, stomach - gastric paralysis, intestines - malabsorption, nerves - severe painful neuritis, circulation - gangrene & loss of a leg(s), and finally cancer and Alzheimers.    I recommend avoid fried & greasy foods,  sweets/candy, white rice (brown or wild rice or Quinoa is OK), white potatoes (sweet potatoes are OK) - anything made from white flour - bagels, doughnuts, rolls, buns, biscuits,white and wheat breads, pizza crust and traditional pasta made of white flour & egg white(vegetarian pasta or spinach or wheat pasta is OK).  Multi-grain bread is OK - like multi-grain flat bread or sandwich thins. Avoid alcohol in excess. Exercise is also important.    Eat all the vegetables you want - avoid meat, especially red meat and dairy - especially cheese.  Cheese is the most concentrated form of trans-fats which is the worst thing to clog up our arteries. Veggie cheese is OK which can be found in the fresh produce section at [employer] or [employer] or [employer]  ++++++++++++++++++++++++++++++++++++++++++++++++++ DASH Eating Plan  DASH stands for "Dietary Approaches to Stop Hypertension."   The DASH eating plan is a healthy eating  plan that has been shown to reduce high blood pressure (hypertension). Additional health benefits may include reducing the risk of type 2 diabetes mellitus, heart disease, and stroke. The DASH eating plan may also help with weight loss.  WHAT DO I NEED TO KNOW ABOUT THE DASH EATING PLAN?  For the DASH eating plan, you will follow these general  guidelines:  Choose foods with a percent daily value for sodium of less than 5% (as listed on the food label).  Use salt-free seasonings or herbs instead of table salt or sea salt.  Check with your health care provider or pharmacist before using salt substitutes.  Eat lower-sodium products, often labeled as "lower sodium" or "no salt added."  Eat fresh foods.  Eat more vegetables, fruits, and low-fat dairy products.    Choose whole grains. Look for the word "whole" as the first word in the ingredient list.  Choose fish   Limit sweets, desserts, sugars, and sugary drinks.  Choose heart-healthy fats.  Eat veggie cheese   Eat more home-cooked food and less restaurant, buffet, and fast food.  Limit fried foods.  Cook foods using methods other than frying.  Limit canned vegetables. If you do use them, rinse them well to decrease the sodium.  When eating at a restaurant, ask that your food be prepared with less salt, or no salt if possible.                      WHAT FOODS CAN I EAT?  Read Dr Fara Olden Fuhrman's books on The End of Dieting & The End of Diabetes  Grains  Whole grain or whole wheat bread. Brown rice. Whole grain or whole wheat pasta. Quinoa, bulgur, and whole grain cereals. Low-sodium cereals. Corn or whole wheat flour tortillas. Whole grain cornbread. Whole grain crackers. Low-sodium crackers.  Vegetables  Fresh or frozen vegetables (raw, steamed, roasted, or grilled). Low-sodium or reduced-sodium tomato and vegetable juices. Low-sodium or reduced-sodium tomato sauce and paste. Low-sodium or reduced-sodium canned vegetables.   Fruits  All fresh, canned (in natural juice), or frozen fruits.  Protein Products   All fish and seafood.  Dried beans, peas, or lentils. Unsalted nuts and seeds. Unsalted canned beans.  Dairy  Low-fat dairy products, such as skim or 1% milk, 2% or reduced-fat cheeses, low-fat ricotta or cottage cheese, or plain low-fat yogurt.  Low-sodium or reduced-sodium cheeses.  Fats and Oils  Tub margarines without trans fats. Light or reduced-fat mayonnaise and salad dressings (reduced sodium). Avocado. Safflower, olive, or canola oils. Natural peanut or almond butter.  Other  Unsalted popcorn and pretzels. The items listed above may not be a complete list of recommended foods or beverages. Contact your dietitian for more options.  +++++++++++++++++++++++++++++++++++++++++++  WHAT FOODS ARE NOT RECOMMENDED?  Grains/ White flour or wheat flour  White bread. White pasta. White rice. Refined cornbread. Bagels and croissants. Crackers that contain trans fat.  Vegetables  Creamed or fried vegetables. Vegetables in a . Regular canned vegetables. Regular canned tomato sauce and paste. Regular tomato and vegetable juices.  Fruits  Dried fruits. Canned fruit in light or heavy syrup. Fruit juice.  Meat and Other Protein Products  Meat in general - RED mwaet & White meat.  Fatty cuts of meat. Ribs, chicken wings, bacon, sausage, bologna, salami, chitterlings, fatback, hot dogs, bratwurst, and packaged luncheon meats.  Dairy  Whole or 2% milk, cream, half-and-half, and cream cheese. Whole-fat or sweetened yogurt. Full-fat cheeses or blue cheese. Nondairy  creamers and whipped toppings. Processed cheese, cheese spreads, or cheese curds.  Condiments  Onion and garlic salt, seasoned salt, table salt, and sea salt. Canned and packaged gravies. Worcestershire sauce. Tartar sauce. Barbecue sauce. Teriyaki sauce. Soy sauce, including reduced sodium. Steak sauce. Fish sauce. Oyster sauce. Cocktail sauce. Horseradish. Ketchup and mustard. Meat flavorings and tenderizers. Bouillon cubes. Hot sauce. Tabasco sauce. Marinades. Taco seasonings. Relishes.  Fats and Oils Butter, stick margarine, lard, shortening and bacon fat. Coconut, palm kernel, or palm oils. Regular salad dressings.  Pickles and olives. Salted popcorn and  pretzels.  The items listed above may not be a complete list of foods and beverages to avoid.

## 2015-05-31 LAB — URINALYSIS, ROUTINE W REFLEX MICROSCOPIC
Bilirubin Urine: NEGATIVE
Glucose, UA: NEGATIVE
KETONES UR: NEGATIVE
NITRITE: NEGATIVE
Protein, ur: NEGATIVE
Specific Gravity, Urine: 1.012 (ref 1.001–1.035)
pH: 6 (ref 5.0–8.0)

## 2015-05-31 LAB — URINALYSIS, MICROSCOPIC ONLY
Casts: NONE SEEN [LPF]
Crystals: NONE SEEN [HPF]
Yeast: NONE SEEN [HPF]

## 2015-05-31 LAB — URINE CULTURE

## 2015-06-01 ENCOUNTER — Encounter: Payer: Self-pay | Admitting: Internal Medicine

## 2015-06-01 NOTE — Progress Notes (Signed)
Subjective:    Patient ID: Katie [name], female    DOB: 07/19/1940, 75 y.o.   MRN: [id number]  HPI This very nice Patient  On coumadin for Afib presents with c/o frontal and maxillary sinus pressure and also low midline abdominal discomfort. Denies fevers, chills, sweats, rashes, N/V, Dor C. No definite dysuria or change in character of urine.   Medication Sig  . allopurinol (ZYLOPRIM) 300 MG tablet TAKE 1 TABLET BY MOUTH DAILY  . aspirin 81 MG tablet Take 81 mg by mouth daily.    . benazepril (LOTENSIN) 20 MG tablet TAKE 1 TABLET BY MOUTH DAILY AS NEEDED FOR BLOOD PRESSURE AND HEART  . bisoprolol-hydrochlorothiazide (ZIAC) 5-6.25 MG tablet Take 1 tablet by mouth daily.  Marland Kitchen dexamethasone (DECADRON) 0.1 % ophthalmic suspension Place 1 drop into both eyes daily.  . digoxin (LANOXIN) 0.25 MG tablet TAKE 1 TABLET BY MOUTH DAILY  . furosemide (LASIX) 80 MG tablet TAKE 1 TABLET BY MOUTH TWICE A DAY AS NEEDED FOR FLUID  . gabapentin (NEURONTIN) 300 MG capsule TAKE ONE CAPSULE BY MOUTH 3 TIMES A DAY  . ipratropium (ATROVENT) 0.02 % nebulizer solution Take 0.5 mg by nebulization every 4 (four) hours as needed for wheezing or shortness of breath.   Marland Kitchen ipratropium (ATROVENT) 0.03 % nasal spray Place 2 sprays into both nostrils every 12 (twelve) hours.  Marland Kitchen ipratropium-albuterol (DUONEB) 0.5-2.5 (3) MG/3ML SOLN Use 3 cc  4 x day or every 4 hours if needed for shortness of breath.  . levothyroxine (SYNTHROID, LEVOTHROID) 75 MCG tablet Take 1 tablet (75 mcg total) by mouth daily.  . meclizine (ANTIVERT) 25 MG tablet Take 1 tablet (25 mg total) by mouth 3 (three) times daily as needed for dizziness.  . metolazone (ZAROXOLYN) 2.5 MG tablet Take 1 tablet (2.5 mg total) by mouth daily.  . montelukast (SINGULAIR) 10 MG tablet Take 1 tablet (10 mg total) by mouth daily as needed. For allergy  . potassium chloride SA (K-DUR,KLOR-CON) 20 MEQ tablet Take 2 tablets (40 mEq total) by mouth daily.  . pravastatin  (PRAVACHOL) 40 MG tablet TAKE 1 TABLET BY MOUTH AT BEDTIME FOR CHOLESTEROL  . Tiotropium Bromide Monohydrate (SPIRIVA RESPIMAT) 2.5 MCG/ACT AERS Use 1 to 2 puff once daily (Patient taking differently: Inhale 1 to 2 puffs into the lungs once daily)  . warfarin (COUMADIN) 2 MG tablet TAKE 1 TO 2 TABLETS BY MOUTH DAILY OR AS DIRECTED  . predniSONE (DELTASONE) 20 MG tablet TAKE 3 TABS FOR 3 DAYS, 2 TABS FOR 3 DAYS, 1.5 TABS FOR 3 DAYS, 1 TAB FOR 3 DAYS, 0.5 TABS FOR 3 DAY  . albuterol (PROVENTIL HFA;VENTOLIN HFA) 108 (90 BASE) MCG/ACT inhaler 2 puffs 5 minutes apart every 4 to 6 hours as needed to rescue asthma (Patient taking differently: Inhale 2 puffs into the lungs 5 minutes apart every 4 to 6 hours as needed to rescue asthma)  . triamcinolone cream (KENALOG) 0.1 % Apply 1 application topically 3 (three) times daily.   No facility-administered medications prior to visit.   Allergies  Allergen Reactions  . Advair Diskus [Fluticasone-Salmeterol] Other (See Comments)    SKIN CHANGES.  Can tolerate low dose of prednisone without complications  . Amiodarone Other (See Comments)    PULMONARY TOXICITY  . Codeine     unknown  . Diovan [Valsartan] Other (See Comments)    HYPOTENSION  . Doxycycline Diarrhea and Other (See Comments)    VISUAL DISTURBANCE  . Flexeril [Cyclobenzaprine] Other (See Comments)  FATIGUE  . Keflex [Cephalexin] Diarrhea  . Verapamil Other (See Comments)    EDEMA   Past Medical History  Diagnosis Date  . Atrial fibrillation ([place])   . Bronchitis, chronic ([place])   . History of Bell's palsy   . Coronary artery disease   . Migraine headache   . Atrial flutter ([place])   . Tobacco abuse   . Osteopenia   . Cervical dysplasia   . Thyroid disease     Hypo  . Unspecified vitamin D deficiency   . Family history of anesthesia complication     Hard for daughter to wake up after Anesthesia  . Shortness of breath     with exertion  . Cystocele     either on bladder or  kidneys patient is unsure but stated physician will watch it  . Seasonal allergies   . Dizziness     If patient gets out of bed too fast  . H/O hiatal hernia   . Rosacea conjunctivitis     Right eye is worse than left eye  . Arthritis   . Gout   . Aortic stenosis     mild AS witm mild to mod AR by 11/2012 echo   Review of Systems  10 point systems review negative except as above.    Objective:   Physical Exam  BP 104/70 mmHg  Pulse 64  Temp(Src) 97.7 F (36.5 C)  Resp 16  Ht 5' 3.5" (1.613 m)  Wt 197 lb 12.8 oz (89.721 kg)  BMI 34.48 kg/m2  HEENT - Eac's patent. TM's Nl. EOM's full. PERRLA. NasoOroPharynx clear.(+) tender frontal/maxillary areas. Neck - supple. Nl Thyroid. Carotids 2+ & No bruits, nodes, JVD Chest - Clear equal BS w/o Rales, rhonchi, wheezes. Cor - Nl HS. RRR w/o sig MGR. PP 1(+). No edema. Abd - Soft w/o palpable organomegaly, masses or tenderness. BS nl. MS- FROM w/o deformities. Muscle power, tone and bulk Nl. Gait Nl. Neuro - No obvious Cr N abnormalities. Sensory, motor and Cerebellar functions appear Nl w/o focal abnormalities. Psyche - Mental status normal & appropriate.  No delusions, ideations or obvious mood abnormalities.    Assessment & Plan:   1. Essential hypertension   2. Chronic atrial fibrillation (HCC)   3. Long term current use of anticoagulant therapy  - Protime-INR  4. Urinary tract infection, site not specified  - Urinalysis, Routine w reflex microscopic (not at [facility]) - Urine culture - sulfamethoxazole-trimethoprim (BACTRIM DS,SEPTRA DS) 800-160 MG tablet; Take 1 tablet 2 x day with food for infection  Dispense: 30 tablet; Refill: 0  5. Maxillary sinusitis, chronic  - sulfamethoxazole-trimethoprim (BACTRIM DS,SEPTRA DS) 800-160 MG tablet; Take 1 tablet 2 x day with food for infection  Dispense: 30 tablet; Refill: 0  - advised cut coumadin dose in 1/2 while on Antibiotics and monitor for increased bruising or bleeding.

## 2015-06-09 DIAGNOSIS — M1712 Unilateral primary osteoarthritis, left knee: Secondary | ICD-10-CM | POA: Diagnosis not present

## 2015-06-09 DIAGNOSIS — M170 Bilateral primary osteoarthritis of knee: Secondary | ICD-10-CM | POA: Diagnosis not present

## 2015-06-09 DIAGNOSIS — H10413 Chronic giant papillary conjunctivitis, bilateral: Secondary | ICD-10-CM | POA: Diagnosis not present

## 2015-06-09 DIAGNOSIS — M19072 Primary osteoarthritis, left ankle and foot: Secondary | ICD-10-CM | POA: Diagnosis not present

## 2015-06-09 DIAGNOSIS — M1711 Unilateral primary osteoarthritis, right knee: Secondary | ICD-10-CM | POA: Diagnosis not present

## 2015-06-09 DIAGNOSIS — Z961 Presence of intraocular lens: Secondary | ICD-10-CM | POA: Diagnosis not present

## 2015-06-09 DIAGNOSIS — H04123 Dry eye syndrome of bilateral lacrimal glands: Secondary | ICD-10-CM | POA: Diagnosis not present

## 2015-06-11 ENCOUNTER — Other Ambulatory Visit: Payer: Self-pay | Admitting: Internal Medicine

## 2015-07-03 ENCOUNTER — Encounter: Payer: Self-pay | Admitting: Internal Medicine

## 2015-07-03 ENCOUNTER — Ambulatory Visit (INDEPENDENT_AMBULATORY_CARE_PROVIDER_SITE_OTHER): Payer: Medicare Other | Admitting: Internal Medicine

## 2015-07-03 VITALS — BP 108/62 | HR 70 | Temp 98.0°F | Resp 18 | Ht 63.5 in | Wt 198.0 lb

## 2015-07-03 DIAGNOSIS — I739 Peripheral vascular disease, unspecified: Secondary | ICD-10-CM

## 2015-07-03 DIAGNOSIS — I10 Essential (primary) hypertension: Secondary | ICD-10-CM | POA: Diagnosis not present

## 2015-07-03 DIAGNOSIS — E782 Mixed hyperlipidemia: Secondary | ICD-10-CM

## 2015-07-03 DIAGNOSIS — J8410 Pulmonary fibrosis, unspecified: Secondary | ICD-10-CM | POA: Diagnosis not present

## 2015-07-03 DIAGNOSIS — G43909 Migraine, unspecified, not intractable, without status migrainosus: Secondary | ICD-10-CM | POA: Diagnosis not present

## 2015-07-03 DIAGNOSIS — I482 Chronic atrial fibrillation, unspecified: Secondary | ICD-10-CM

## 2015-07-03 DIAGNOSIS — I714 Abdominal aortic aneurysm, without rupture, unspecified: Secondary | ICD-10-CM

## 2015-07-03 DIAGNOSIS — J449 Chronic obstructive pulmonary disease, unspecified: Secondary | ICD-10-CM

## 2015-07-03 DIAGNOSIS — N183 Chronic kidney disease, stage 3 unspecified: Secondary | ICD-10-CM

## 2015-07-03 DIAGNOSIS — Z0001 Encounter for general adult medical examination with abnormal findings: Secondary | ICD-10-CM | POA: Diagnosis not present

## 2015-07-03 DIAGNOSIS — M8580 Other specified disorders of bone density and structure, unspecified site: Secondary | ICD-10-CM

## 2015-07-03 DIAGNOSIS — E039 Hypothyroidism, unspecified: Secondary | ICD-10-CM

## 2015-07-03 DIAGNOSIS — Z7901 Long term (current) use of anticoagulants: Secondary | ICD-10-CM

## 2015-07-03 DIAGNOSIS — N879 Dysplasia of cervix uteri, unspecified: Secondary | ICD-10-CM

## 2015-07-03 DIAGNOSIS — E559 Vitamin D deficiency, unspecified: Secondary | ICD-10-CM

## 2015-07-03 DIAGNOSIS — I5020 Unspecified systolic (congestive) heart failure: Secondary | ICD-10-CM

## 2015-07-03 DIAGNOSIS — R6889 Other general symptoms and signs: Secondary | ICD-10-CM

## 2015-07-03 DIAGNOSIS — Z79899 Other long term (current) drug therapy: Secondary | ICD-10-CM

## 2015-07-03 DIAGNOSIS — Z0000 Encounter for general adult medical examination without abnormal findings: Secondary | ICD-10-CM

## 2015-07-03 DIAGNOSIS — I2510 Atherosclerotic heart disease of native coronary artery without angina pectoris: Secondary | ICD-10-CM

## 2015-07-03 DIAGNOSIS — K219 Gastro-esophageal reflux disease without esophagitis: Secondary | ICD-10-CM

## 2015-07-03 LAB — PROTIME-INR
INR: 1.8 — AB (ref <1.50)
Prothrombin Time: 21.1 seconds — ABNORMAL HIGH (ref 11.6–15.2)

## 2015-07-03 MED ORDER — FLUTICASONE PROPIONATE 50 MCG/ACT NA SUSP: 2.0000 | Freq: Every day | NASAL | Status: DC

## 2015-07-03 MED ORDER — CETIRIZINE HCL 10 MG PO CAPS: 10.0000 mg | ORAL_CAPSULE | Freq: Every day | ORAL | Status: DC

## 2015-07-03 NOTE — Progress Notes (Signed)
MEDICARE ANNUAL WELLNESS VISIT AND FOLLOW UP  Assessment:    1. Long term current use of anticoagulant therapy -cont coumadin -adjust prn based on INR - Protime-INR  2. Migraine without status migrainosus, not intractable, unspecified migraine type -followed by neurology  3. Essential hypertension -cont meds -well controlled -monitor at home  4. Atherosclerosis of native coronary artery of native heart without angina pectoris -followed by cards -cont cholesterol medications  5. Chronic atrial fibrillation (HCC) -cont coumadin -cont meds -followed by cards  6. Systolic congestive heart failure, unspecified congestive heart failure chronicity ([place]) -followed by cards  7. Abdominal aortic aneurysm (AAA) without rupture ([place]) -followed by yearly ultrasound -followed by vascular  8. PVD (peripheral vascular disease) with claudication ([place]) -followed by cards  9. PAD (peripheral artery disease) ([place]) -followed by cards  10. AAA (abdominal aortic aneurysm) without rupture ([place]) -followed by vascular  11. COPD (chronic obstructive pulmonary disease) with chronic bronchitis (HCC) -quit smoking -cont inhalers  12. Pulmonary Fibrosis sequellae of Amiodarone -no longer on amiodarone   13. Gastroesophageal reflux disease, esophagitis presence not specified -cont meds -avoid trigger foods  14. Hypothyroidism, unspecified hypothyroidism type -cont levothyroxine  15. Osteopenia -impact exercises as tolerated -cont vit D and calcium  16. Cervical dysplasia -followed by ortho  17. CKD  stage III (GFR 51 ml/min) -avoid NSAIDs   18. Vitamin D deficiency -Vit  D def  19. Hyperlipidemia -cont statin  20. Medication management  21. Morbid obesity, unspecified obesity type ([place]) -cont diet and exercise  22. Medicare annual wellness visit, initial -due next year    Over 30 minutes of exam, counseling, chart review, and critical decision making was  performed  Plan:   During the course of the visit the patient was educated and counseled about appropriate screening and preventive services including:    Pneumococcal vaccine   Influenza vaccine  Td vaccine  Prevnar 13  Screening electrocardiogram  Screening mammography  Bone densitometry screening  Colorectal cancer screening  Diabetes screening  Glaucoma screening  Nutrition counseling   Advanced directives: given info/requested copies  Conditions/risks identified: Diabetes is at goal, ACE/ARB therapy: Yes. Urinary Incontinence is not an issue: discussed non pharmacology and pharmacology options.  Fall risk: low- discussed PT, home fall assessment, medications.    Subjective:   Katie [name] is a 75 y.o. female who presents for Medicare Annual Wellness Visit and 3 month follow up on hypertension, prediabetes, hyperlipidemia, vitamin D def.  Date of last medicare wellness visit is   She reports to the office today for a recheck of her coumadin for chronic atrial fibrillation.  She reports that she hasn't missed any doses.  She has not been on any antibiotics for a couple weeks.  She reports that she did take a half dose of her coumadin while on the antibiotics.  She hasn't fallen or hit her head.  No blood in her stools, no black colored stools.  No nose bleeds.    She does note that her allergies have been an increasing issue over the last couple weeks.  She is currently out of zyrtec.  She is also not using flonase currently.  She does use atrovent prn.    Medication Review Current Outpatient Prescriptions on File Prior to Visit  Medication Sig Dispense Refill  . albuterol (PROVENTIL HFA;VENTOLIN HFA) 108 (90 BASE) MCG/ACT inhaler 2 puffs 5 minutes apart every 4 to 6 hours as needed to rescue asthma (Patient taking differently: Inhale 2 puffs into the  lungs 5 minutes apart every 4 to 6 hours as needed to rescue asthma) 1 Inhaler 11  . allopurinol (ZYLOPRIM) 300  MG tablet TAKE 1 TABLET BY MOUTH DAILY 90 tablet 1  . aspirin 81 MG tablet Take 81 mg by mouth daily.      . benazepril (LOTENSIN) 20 MG tablet TAKE 1 TABLET BY MOUTH DAILY AS NEEDED FOR BLOOD PRESSURE AND HEART 90 tablet 2  . bisoprolol-hydrochlorothiazide (ZIAC) 5-6.25 MG tablet Take 1 tablet by mouth daily. 90 tablet 3  . dexamethasone (DECADRON) 0.1 % ophthalmic suspension Place 1 drop into both eyes daily.    . digoxin (LANOXIN) 0.25 MG tablet TAKE 1 TABLET BY MOUTH DAILY 90 tablet 3  . furosemide (LASIX) 80 MG tablet TAKE 1 TABLET BY MOUTH TWICE A DAY AS NEEDED FOR FLUID 180 tablet 1  . gabapentin (NEURONTIN) 300 MG capsule TAKE ONE CAPSULE BY MOUTH 3 TIMES A DAY 270 capsule 1  . ipratropium (ATROVENT) 0.02 % nebulizer solution Take 0.5 mg by nebulization every 4 (four) hours as needed for wheezing or shortness of breath.     Marland Kitchen ipratropium (ATROVENT) 0.03 % nasal spray Place 2 sprays into both nostrils every 12 (twelve) hours. 30 mL 12  . ipratropium-albuterol (DUONEB) 0.5-2.5 (3) MG/3ML SOLN Use 3 cc  4 x day or every 4 hours if needed for shortness of breath. 360 mL 99  . levothyroxine (SYNTHROID, LEVOTHROID) 75 MCG tablet Take 1 tablet (75 mcg total) by mouth daily. 90 tablet 1  . meclizine (ANTIVERT) 25 MG tablet Take 1 tablet (25 mg total) by mouth 3 (three) times daily as needed for dizziness. 60 tablet 1  . metolazone (ZAROXOLYN) 2.5 MG tablet Take 1 tablet (2.5 mg total) by mouth daily. 10 tablet 0  . montelukast (SINGULAIR) 10 MG tablet TAKE 1 TABLET EVERY DAY FOR ALLERGIES 30 tablet 6  . potassium chloride SA (K-DUR,KLOR-CON) 20 MEQ tablet Take 2 tablets (40 mEq total) by mouth daily. 30 tablet 0  . pravastatin (PRAVACHOL) 40 MG tablet TAKE 1 TABLET BY MOUTH AT BEDTIME FOR CHOLESTEROL 90 tablet 1  . Tiotropium Bromide Monohydrate (SPIRIVA RESPIMAT) 2.5 MCG/ACT AERS Use 1 to 2 puff once daily (Patient taking differently: Inhale 1 to 2 puffs into the lungs once daily) 4 g 99  .  warfarin (COUMADIN) 2 MG tablet TAKE 1 TO 2 TABLETS BY MOUTH DAILY OR AS DIRECTED 180 tablet 3   No current facility-administered medications on file prior to visit.    Current Problems (verified) Patient Active Problem List   Diagnosis Date Noted  . AAA (abdominal aortic aneurysm) without rupture ([place]) 04/22/2015  . Medicare annual wellness visit, initial 11/13/2014  . Morbid obesity (BMI 34+)  08/03/2014  . PAD (peripheral artery disease) ([place]) 04/16/2014  . PVD (peripheral vascular disease) with claudication ([place]) 10/16/2013  . CKD  stage III (GFR 51 ml/min) 06/08/2013  . Hyperlipidemia 06/08/2013  . Medication management 06/08/2013  . 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 Long term current use of anticoagulant therapy 04/23/2013  . AAA (abdominal aortic aneurysm) ([place]) 04/11/2013  . Vitamin D deficiency 02/15/2013  . Other abnormal glucose 02/15/2013  . Cervical dysplasia   . Hypothyroidism   . Osteopenia   . Congestive heart failure ([place]) 11/25/2008  . MIGRAINE HEADACHE 11/22/2008  . Essential hypertension 11/22/2008  . Coronary atherosclerosis 11/22/2008  . Atrial fibrillation ([place]) 11/22/2008  . COPD (chronic obstructive pulmonary disease) with chronic bronchitis ([place]) 11/22/2008  . GERD  11/22/2008  . BELL'S PALSY, HX OF 11/22/2008    Screening Tests Immunization History  Administered Date(s) Administered  . Influenza, High Dose Seasonal PF 12/20/2013, 12/09/2014  . Influenza-Unspecified 01/02/2013  . Pneumococcal Conjugate-13 01/23/2014  . Pneumococcal-Unspecified 03/15/1993, 05/31/2008  . Td 03/16/2007  . Varicella 02/19/2008    Preventative care: Last colonoscopy: Normal in 2014, declines any further colonoscopy Last mammogram: 08/2014 DEXA:2015, due next year  Prior vaccinations: TD or Tdap: 2009  Influenza: 2016  Pneumococcal: 2010 Prevnar13: 2015 Shingles/Zostavax: 2009  Names of Other Physician/Practitioners you currently  use: 1. [facility]- here for primary care 2. Dr. Patrice Paradise, eye doctor, last visit Jan 2017 3. [facility], dentist, last visit remotely  Patient Care Team: [name], MD as PCP - General (Internal Medicine) Evans Lance, MD as Consulting Physician (Cardiology) Georgeann Oppenheim, MD as Consulting Physician (Ophthalmology) Terrance Mass, MD as Consulting Physician (Gynecology)  Past Surgical History  Procedure Laterality Date  . Broken leg Left 1970s  . Laser vein procedure    . Refractive surgery    . Colonoscopy    . Cholecystectomy  1972  . Tonsillectomy    . Eye surgery Bilateral   . Cardiac catheterization    . Abdominal aortic endovascular stent graft N/A 04/11/2013    Procedure: ABDOMINAL AORTIC ENDOVASCULAR STENT GRAFT WITH RIGHT FEMORAL PATCH ANGIOPLASTY;  Surgeon: Mal Misty, MD;  Location: [facility] OR;  Service: Vascular;  Laterality: N/A;  . Breast surgery      LEFT BREAST BIOPSY   Family History  Problem Relation Age of Onset  . Cirrhosis Mother   . Cancer Mother 11    PANCREAS  . Heart defect Sister   . Breast cancer Sister     age 96  . Heart disease Sister   . Stroke Sister   . Alcohol abuse Father   . Depression Father   . Hypertension Brother   . Hyperlipidemia Son   . Heart disease Daughter    Social History  Substance Use Topics  . Smoking status: Former Smoker    Types: Cigarettes    Quit date: 08/10/2002  . Smokeless tobacco: Never Used     Comment: History of tobacco abuse  . Alcohol Use: 0.0 oz/week    0 Standard drinks or equivalent per week     Comment: rare   Allergies  Allergen Reactions  . Advair Diskus [Fluticasone-Salmeterol] Other (See Comments)    SKIN CHANGES.  Can tolerate low dose of prednisone without complications  . Amiodarone Other (See Comments)    PULMONARY TOXICITY  . Codeine     unknown  . Diovan [Valsartan] Other (See Comments)    HYPOTENSION  . Doxycycline Diarrhea and Other  (See Comments)    VISUAL DISTURBANCE  . Flexeril [Cyclobenzaprine] Other (See Comments)    FATIGUE  . Keflex [Cephalexin] Diarrhea  . Verapamil Other (See Comments)    EDEMA    MEDICARE WELLNESS OBJECTIVES: Tobacco use: She does not smoke.  Patient is a former smoker. If yes, counseling given Alcohol Current alcohol use: social drinker Diet: well balanced Physical activity: Current Exercise Habits: The patient does not participate in regular exercise at present Cardiac risk factors: Cardiac Risk Factors include: advanced age (>81men, >53 women);hypertension;smoking/ tobacco exposure;dyslipidemia;diabetes mellitus Depression/mood screen:   Depression screen [facility] 2/9 07/03/2015  Decreased Interest 0  Down, Depressed, Hopeless 0  PHQ - 2 Score 0    ADLs:  In your present state of health, do  you have any difficulty performing the following activities: 07/03/2015 03/14/2015  Hearing? N N  Vision? N N  Difficulty concentrating or making decisions? N N  Walking or climbing stairs? N N  Dressing or bathing? N N  Doing errands, shopping? N -  Preparing Food and eating ? N -  Using the Toilet? N -  In the past six months, have you accidently leaked urine? N -  Do you have problems with loss of bowel control? N -  Managing your Medications? N -  Managing your Finances? N -  Housekeeping or managing your Housekeeping? N -     Cognitive Testing  Alert? Yes  Normal Appearance?Yes  Oriented to person? Yes  Place? Yes   Time? Yes  Recall of three objects?  Yes  Can perform simple calculations? Yes  Displays appropriate judgment?Yes  Can read the correct time from a watch face?Yes  EOL planning: Does patient have an advance directive?: Yes Type of Advance Directive: Healthcare Power of Attorney, Living will Does patient want to make changes to advanced directive?: Yes - information given Copy of advanced directive(s) in chart?: No - copy requested   Objective:   Today's Vitals    07/03/15 0925  BP: 108/62  Pulse: 70  Temp: 98 F (36.7 C)  TempSrc: Temporal  Resp: 18  Height: 5' 3.5" (1.613 m)  Weight: 198 lb (89.812 kg)   Body mass index is 34.52 kg/(m^2).  General appearance: alert, no distress, WD/WN,  female HEENT: normocephalic, sclerae anicteric, TMs pearly, nares patent, no discharge or erythema, pharynx normal Oral cavity: MMM, no lesions Neck: supple, no lymphadenopathy, no thyromegaly, no masses Heart: Ireg ireg, normal S1, S2, no murmurs Lungs: CTA bilaterally, no wheezes, rhonchi, or rales Abdomen: +bs, soft, non tender, non distended, no masses, no hepatomegaly, no splenomegaly Musculoskeletal: nontender, no swelling, no obvious deformity Extremities: no edema, no cyanosis, no clubbing Pulses: 2+ symmetric, upper and lower extremities, normal cap refill Neurological: alert, oriented x 3, CN2-12 intact, strength normal upper extremities and lower extremities, sensation normal throughout, DTRs 2+ throughout, no cerebellar signs, gait normal Psychiatric: normal affect, behavior normal, pleasant  Breast: defer Gyn: defer Rectal: defer   Medicare Attestation I have personally reviewed: The patient's medical and social history Their use of alcohol, tobacco or illicit drugs Their current medications and supplements The patient's functional ability including ADLs,fall risks, home safety risks, cognitive, and hearing and visual impairment Diet and physical activities Evidence for depression or mood disorders  The patient's weight, height, BMI, and visual acuity have been recorded in the chart.  I have made referrals, counseling, and provided education to the patient based on review of the above and I have provided the patient with a written personalized care plan for preventive services.     Starlyn Skeans, PA-C   07/03/2015

## 2015-07-08 ENCOUNTER — Other Ambulatory Visit: Payer: Self-pay | Admitting: Internal Medicine

## 2015-07-30 DIAGNOSIS — M170 Bilateral primary osteoarthritis of knee: Secondary | ICD-10-CM | POA: Diagnosis not present

## 2015-07-30 DIAGNOSIS — M25572 Pain in left ankle and joints of left foot: Secondary | ICD-10-CM | POA: Diagnosis not present

## 2015-08-06 ENCOUNTER — Encounter: Payer: Self-pay | Admitting: Physician Assistant

## 2015-08-06 ENCOUNTER — Ambulatory Visit (INDEPENDENT_AMBULATORY_CARE_PROVIDER_SITE_OTHER): Payer: Medicare Other | Admitting: Physician Assistant

## 2015-08-06 VITALS — BP 110/68 | HR 74 | Temp 97.6°F | Resp 14 | Ht 63.5 in | Wt 196.0 lb

## 2015-08-06 DIAGNOSIS — I482 Chronic atrial fibrillation, unspecified: Secondary | ICD-10-CM

## 2015-08-06 DIAGNOSIS — E039 Hypothyroidism, unspecified: Secondary | ICD-10-CM | POA: Diagnosis not present

## 2015-08-06 DIAGNOSIS — Z7901 Long term (current) use of anticoagulants: Secondary | ICD-10-CM | POA: Diagnosis not present

## 2015-08-06 DIAGNOSIS — B3731 Acute candidiasis of vulva and vagina: Secondary | ICD-10-CM

## 2015-08-06 DIAGNOSIS — B373 Candidiasis of vulva and vagina: Secondary | ICD-10-CM

## 2015-08-06 LAB — PROTIME-INR
INR: 3.7 — AB (ref <1.50)
PROTHROMBIN TIME: 37.3 s — AB (ref 11.6–15.2)

## 2015-08-06 LAB — TSH: TSH: 8.89 m[IU]/L — AB

## 2015-08-06 MED ORDER — METRONIDAZOLE 0.75 % VA GEL: 1.0000 | Freq: Every day | VAGINAL | Status: DC

## 2015-08-06 NOTE — Patient Instructions (Signed)

## 2015-08-06 NOTE — Progress Notes (Addendum)
Coumadin follow up Patient is on Coumadin for Primary Diagnosis: Chronic atrial fibrillation ([place]) [I48.2] Patient's last INR is  Lab Results  Component Value Date   INR 1.80* 07/03/2015   INR 1.30 05/30/2015   INR 1.01 04/18/2015    Patient denies SOB, CP, dizziness, nose bleeds, easy bleeding, and blood in stool/urine. Her coumadin dose was changed last visit. She has been taking her coumadin to 2 mg daily, has not missed any doses, denies any ABX use and denies a fall.   She is on thyroid medication. She is on 1/2 pill at night.  Lab Results  Component Value Date   TSH 12.048* 03/14/2015  .  Has had vaginal discharge, foul odor, no itching, no burning with urination, no sexual partner.     Current Outpatient Prescriptions on File Prior to Visit  Medication Sig Dispense Refill  . albuterol (PROVENTIL HFA;VENTOLIN HFA) 108 (90 BASE) MCG/ACT inhaler 2 puffs 5 minutes apart every 4 to 6 hours as needed to rescue asthma (Patient taking differently: Inhale 2 puffs into the lungs 5 minutes apart every 4 to 6 hours as needed to rescue asthma) 1 Inhaler 11  . allopurinol (ZYLOPRIM) 300 MG tablet TAKE 1 TABLET BY MOUTH DAILY 90 tablet 1  . aspirin 81 MG tablet Take 81 mg by mouth daily.      . benazepril (LOTENSIN) 20 MG tablet TAKE 1 TABLET BY MOUTH DAILY AS NEEDED FOR BLOOD PRESSURE AND HEART 90 tablet 2  . bisoprolol-hydrochlorothiazide (ZIAC) 5-6.25 MG tablet Take 1 tablet by mouth daily. 90 tablet 3  . Cetirizine HCl 10 MG CAPS Take 1 capsule (10 mg total) by mouth at bedtime. 90 capsule 2  . dexamethasone (DECADRON) 0.1 % ophthalmic suspension Place 1 drop into both eyes daily.    . digoxin (LANOXIN) 0.25 MG tablet TAKE 1 TABLET BY MOUTH DAILY 90 tablet 3  . furosemide (LASIX) 80 MG tablet TAKE 1 TABLET BY MOUTH TWICE A DAY AS NEEDED FOR FLUID 180 tablet 1  . gabapentin (NEURONTIN) 300 MG capsule TAKE ONE CAPSULE BY MOUTH 3 TIMES A DAY 270 capsule 1  . ipratropium (ATROVENT) 0.02 %  nebulizer solution Take 0.5 mg by nebulization every 4 (four) hours as needed for wheezing or shortness of breath.     Marland Kitchen ipratropium (ATROVENT) 0.03 % nasal spray Place 2 sprays into both nostrils every 12 (twelve) hours. 30 mL 12  . ipratropium-albuterol (DUONEB) 0.5-2.5 (3) MG/3ML SOLN Use 3 cc  4 x day or every 4 hours if needed for shortness of breath. 360 mL 99  . levothyroxine (SYNTHROID, LEVOTHROID) 75 MCG tablet Take 1 tablet (75 mcg total) by mouth daily. 90 tablet 1  . meclizine (ANTIVERT) 25 MG tablet Take 1 tablet (25 mg total) by mouth 3 (three) times daily as needed for dizziness. 60 tablet 1  . metolazone (ZAROXOLYN) 2.5 MG tablet Take 1 tablet (2.5 mg total) by mouth daily. 10 tablet 0  . montelukast (SINGULAIR) 10 MG tablet TAKE 1 TABLET EVERY DAY FOR ALLERGIES 30 tablet 6  . potassium chloride SA (K-DUR,KLOR-CON) 20 MEQ tablet Take 2 tablets (40 mEq total) by mouth daily. 30 tablet 0  . pravastatin (PRAVACHOL) 40 MG tablet TAKE 1 TABLET BY MOUTH AT BEDTIME FOR CHOLESTEROL 90 tablet 1  . Tiotropium Bromide Monohydrate (SPIRIVA RESPIMAT) 2.5 MCG/ACT AERS Use 1 to 2 puff once daily (Patient taking differently: Inhale 1 to 2 puffs into the lungs once daily) 4 g 99  . warfarin (  COUMADIN) 2 MG tablet TAKE 1 TO 2 TABLETS BY MOUTH DAILY OR AS DIRECTED 180 tablet 3   No current facility-administered medications on file prior to visit.   Past Medical History  Diagnosis Date  . Atrial fibrillation ([place])   . Bronchitis, chronic ([place])   . History of Bell's palsy   . Coronary artery disease   . Migraine headache   . Atrial flutter ([place])   . Tobacco abuse   . Osteopenia   . Cervical dysplasia   . Thyroid disease     Hypo  . Unspecified vitamin D deficiency   . Family history of anesthesia complication     Hard for daughter to wake up after Anesthesia  . Shortness of breath     with exertion  . Cystocele     either on bladder or kidneys patient is unsure but stated physician will  watch it  . Seasonal allergies   . Dizziness     If patient gets out of bed too fast  . H/O hiatal hernia   . Rosacea conjunctivitis     Right eye is worse than left eye  . Arthritis   . Gout   . Aortic stenosis     mild AS witm mild to mod AR by 11/2012 echo   Allergies  Allergen Reactions  . Advair Diskus [Fluticasone-Salmeterol] Other (See Comments)    SKIN CHANGES.  Can tolerate low dose of prednisone without complications  . Amiodarone Other (See Comments)    PULMONARY TOXICITY  . Codeine     unknown  . Diovan [Valsartan] Other (See Comments)    HYPOTENSION  . Doxycycline Diarrhea and Other (See Comments)    VISUAL DISTURBANCE  . Flexeril [Cyclobenzaprine] Other (See Comments)    FATIGUE  . Keflex [Cephalexin] Diarrhea  . Verapamil Other (See Comments)    EDEMA    Review of Systems  Constitutional: Positive for malaise/fatigue. Negative for fever, chills, weight loss and diaphoresis.  HENT: Negative.   Eyes:  Negative for double vision and pain.  Respiratory: Positive for shortness of breath (chronic unchanged). Negative for cough, hemoptysis, sputum production and wheezing.   Cardiovascular: Negative.   Gastrointestinal: Negative.   Genitourinary: Negative, + vaginal discharge.   Skin: Negative.   Neurological: Negative for weakness.    Physical: Blood pressure 110/68, pulse 74, temperature 97.6 F (36.4 C), temperature source Temporal, resp. rate 14, height 5' 3.5" (1.613 m), weight 196 lb (88.905 kg), SpO2 96 %. Filed Weights   08/06/15 0919  Weight: 196 lb (88.905 kg)    General Appearance: Well nourished, in no apparent distress. ENT/Mouth: Eyes clear discharge, some erythema. Nares clear with no erythema, swelling, mucus on turbinates. No ulcers, cracking, on lips. No erythema, swelling, or exudate on post pharynx.  Neck: Supple, thyroid normal.  Respiratory: CTAB   Cardio: Irregular, irregular rhythm, no murmurs, rubs or gallops. No edema  Abdomen:  Soft, with bowl sounds. Non tender, no guarding, rebound, hernias, masses, or organomegaly.  VULVA: normal appearing vulva with no masses, tenderness or lesions, vulvar hypopigmentation , VAGINA: atrophic, PELVIC FLOOR EXAM: no cystocele, rectocele or prolapse noted, vaginal discharge - clear, copious and malodorous, CERVIX: normal appearing cervix without discharge or lesions, WET PREP SENT OFF. Skin: Warm, dry without rashes, lesions, ecchymosis.  Neuro: Unremarkable  Assessment and plan: Chronic anticoagulation- check INR and will adjust medication according to labs.  Discussed if patient falls to immediately contact office or go to ER. Discussed foods that can  increase or decrease Coumadin levels. Patient understands to call the office before starting a new medication. Follow up in one month.   Hypothyroidism -check TSH level, continue medications the same, reminded to take on an empty stomach 30-42mins before food.  Will refill 45mcg daily  Abnormal discharge Has had 3 diflucan without help Likely BV, dicussed prevention, gel sent in - WET PREP

## 2015-08-07 LAB — WET PREP BY MOLECULAR PROBE
CANDIDA SPECIES: NEGATIVE
Gardnerella vaginalis: NEGATIVE
Trichomonas vaginosis: NEGATIVE

## 2015-08-19 ENCOUNTER — Other Ambulatory Visit: Payer: Self-pay | Admitting: Internal Medicine

## 2015-08-26 ENCOUNTER — Encounter: Payer: Self-pay | Admitting: Internal Medicine

## 2015-08-26 ENCOUNTER — Ambulatory Visit (INDEPENDENT_AMBULATORY_CARE_PROVIDER_SITE_OTHER): Payer: Medicare Other | Admitting: Internal Medicine

## 2015-08-26 VITALS — BP 122/76 | HR 68 | Temp 98.0°F | Resp 18 | Ht 63.5 in | Wt 199.0 lb

## 2015-08-26 DIAGNOSIS — J329 Chronic sinusitis, unspecified: Secondary | ICD-10-CM

## 2015-08-26 MED ORDER — SULFAMETHOXAZOLE-TRIMETHOPRIM 800-160 MG PO TABS: 1.0000 | ORAL_TABLET | Freq: Two times a day (BID) | ORAL | Status: DC

## 2015-08-26 MED ORDER — PREDNISONE 20 MG PO TABS: ORAL_TABLET | ORAL | Status: DC

## 2015-08-26 MED ORDER — IPRATROPIUM BROMIDE 0.03 % NA SOLN: 2.0000 | Freq: Two times a day (BID) | NASAL | Status: DC

## 2015-08-26 NOTE — Progress Notes (Signed)
HPI  Patient presents to the office for evaluation of cough and congestion x 2 days.  Patient reports night > day, wet, barky, worse with lying down.  They also endorse change in voice, chills, postnasal drip, shortness of breath, sputum production, wheezing and yellow sputum production, extreme fatigue, sore throat, ear congestion.  Yellow nasal congestion..  They have tried no OTC meds.  They report that nothing has worked.  They denies other sick contacts.  She notes that she did have some whiskey and honey.  She notes that her granddaughter did have cough and runny nose last week.    Review of Systems  Constitutional: Positive for chills and malaise/fatigue. Negative for fever.  HENT: Positive for congestion, ear pain and sore throat.   Respiratory: Positive for cough, shortness of breath and wheezing.   Cardiovascular: Negative for chest pain, palpitations and leg swelling.  Skin: Negative.   Neurological: Positive for headaches.    PE: Filed Vitals:   08/26/15 1106  BP: 122/76  Pulse: 68  Temp: 98 F (36.7 C)  Resp: 18    General:  Alert and non-toxic, WDWN, NAD HEENT: NCAT, PERLA, EOM normal, no occular discharge or erythema.  Nasal mucosal edema with sinus tenderness to palpation.  Oropharynx clear with minimal oropharyngeal edema and erythema.  Mucous membranes moist and pink. Neck:  Cervical adenopathy Chest:  RRR no MRGs.  Lungs clear to auscultation A&P with no wheezes rhonchi or rales.  Abdomen: +BS x 4 quadrants, soft, non-tender, no guarding, rigidity, or rebound. Skin: warm and dry no rash Neuro: A&Ox4, CN II-XII grossly intact  Assessment and Plan:   1. Chronic sinusitis, unspecified location -bactrim -prednisone -fluids -albuterol inhalations every 6-8 hours prn for wheezing - ipratropium (ATROVENT) 0.03 % nasal spray; Place 2 sprays into both nostrils every 12 (twelve) hours.  Dispense: 30 mL; Refill: 12

## 2015-09-02 ENCOUNTER — Telehealth: Payer: Self-pay | Admitting: Internal Medicine

## 2015-09-02 NOTE — Telephone Encounter (Signed)
still has sore throat, coughing and spitting up phlem. [facility] [address]

## 2015-09-04 ENCOUNTER — Ambulatory Visit: Payer: Medicare Other | Admitting: Physician Assistant

## 2015-09-04 ENCOUNTER — Encounter: Payer: Self-pay | Admitting: Physician Assistant

## 2015-09-04 VITALS — BP 110/60 | HR 62 | Temp 98.1°F | Resp 14 | Ht 63.0 in | Wt 198.0 lb

## 2015-09-04 DIAGNOSIS — J449 Chronic obstructive pulmonary disease, unspecified: Secondary | ICD-10-CM | POA: Diagnosis not present

## 2015-09-04 DIAGNOSIS — I509 Heart failure, unspecified: Secondary | ICD-10-CM | POA: Diagnosis not present

## 2015-09-04 DIAGNOSIS — I5020 Unspecified systolic (congestive) heart failure: Secondary | ICD-10-CM

## 2015-09-04 DIAGNOSIS — I714 Abdominal aortic aneurysm, without rupture, unspecified: Secondary | ICD-10-CM

## 2015-09-04 DIAGNOSIS — I739 Peripheral vascular disease, unspecified: Secondary | ICD-10-CM | POA: Diagnosis not present

## 2015-09-04 DIAGNOSIS — M48061 Spinal stenosis, lumbar region without neurogenic claudication: Secondary | ICD-10-CM

## 2015-09-04 DIAGNOSIS — J329 Chronic sinusitis, unspecified: Secondary | ICD-10-CM

## 2015-09-04 DIAGNOSIS — I482 Chronic atrial fibrillation, unspecified: Secondary | ICD-10-CM

## 2015-09-04 DIAGNOSIS — I4891 Unspecified atrial fibrillation: Secondary | ICD-10-CM | POA: Diagnosis not present

## 2015-09-04 LAB — CBC WITH DIFFERENTIAL/PLATELET
BASOS PCT: 0 %
Basophils Absolute: 0 cells/uL (ref 0–200)
Eosinophils Absolute: 327 cells/uL (ref 15–500)
Eosinophils Relative: 3 %
HEMATOCRIT: 44.6 % (ref 35.0–45.0)
Hemoglobin: 15 g/dL (ref 11.7–15.5)
LYMPHS PCT: 19 %
Lymphs Abs: 2071 cells/uL (ref 850–3900)
MCH: 31.7 pg (ref 27.0–33.0)
MCHC: 33.6 g/dL (ref 32.0–36.0)
MCV: 94.3 fL (ref 80.0–100.0)
MPV: 9.7 fL (ref 7.5–12.5)
Monocytes Absolute: 763 cells/uL (ref 200–950)
Monocytes Relative: 7 %
Neutro Abs: 7739 cells/uL (ref 1500–7800)
Neutrophils Relative %: 71 %
Platelets: 229 10*3/uL (ref 140–400)
RBC: 4.73 MIL/uL (ref 3.80–5.10)
RDW: 15.3 % — AB (ref 11.0–15.0)
WBC: 10.9 10*3/uL — ABNORMAL HIGH (ref 3.8–10.8)

## 2015-09-04 LAB — PROTIME-INR
INR: 1.2 — ABNORMAL HIGH
PROTHROMBIN TIME: 12.3 s — AB (ref 9.0–11.5)

## 2015-09-04 LAB — HEPATIC FUNCTION PANEL
ALK PHOS: 28 U/L — AB (ref 33–130)
ALT: 13 U/L (ref 6–29)
AST: 13 U/L (ref 10–35)
Albumin: 3.9 g/dL (ref 3.6–5.1)
BILIRUBIN DIRECT: 0.2 mg/dL (ref <=0.2)
BILIRUBIN TOTAL: 0.9 mg/dL (ref 0.2–1.2)
Indirect Bilirubin: 0.7 mg/dL (ref 0.2–1.2)
Total Protein: 5.9 g/dL — ABNORMAL LOW (ref 6.1–8.1)

## 2015-09-04 LAB — BASIC METABOLIC PANEL WITH GFR
BUN: 33 mg/dL — AB (ref 7–25)
CHLORIDE: 103 mmol/L (ref 98–110)
CO2: 25 mmol/L (ref 20–31)
Calcium: 9.3 mg/dL (ref 8.6–10.4)
Creat: 1.58 mg/dL — ABNORMAL HIGH (ref 0.60–0.93)
GFR, EST AFRICAN AMERICAN: 37 mL/min — AB (ref >=60)
GFR, Est Non African American: 32 mL/min — ABNORMAL LOW (ref >=60)
GLUCOSE: 88 mg/dL (ref 65–99)
POTASSIUM: 5.2 mmol/L (ref 3.5–5.3)
Sodium: 136 mmol/L (ref 135–146)

## 2015-09-04 MED ORDER — AZELASTINE HCL 0.1 % NA SOLN: 2.0000 | Freq: Two times a day (BID) | NASAL | Status: DC

## 2015-09-04 MED ORDER — LOSARTAN POTASSIUM 100 MG PO TABS: 100.0000 mg | ORAL_TABLET | Freq: Every day | ORAL | Status: DC

## 2015-09-04 MED ORDER — TIOTROPIUM BROMIDE MONOHYDRATE 2.5 MCG/ACT IN AERS: INHALATION_SPRAY | RESPIRATORY_TRACT | Status: DC

## 2015-09-04 NOTE — Patient Instructions (Addendum)
Get back on spiriva once daily STOP benazepril and start on losartan 100mg  once daily, monitor BP, if too low cut in half Get on mucinex Continue allergy pills Add astelin nasal spray as needed   Follow up with Dr. Ninfa Linden about your spine, think it is spinal stenosis  Spinal Stenosis Spinal stenosis is an abnormal narrowing of the canals of your spine (vertebrae). CAUSES  Spinal stenosis is caused by areas of bone pushing into the central canals of your vertebrae. This condition can be present at birth (congenital). It also may be caused by arthritic deterioration of your vertebrae (spinal degeneration).  SYMPTOMS   Pain that is generally worse with activities, particularly standing and walking.  Numbness, tingling, hot or cold sensations, weakness, or weariness in your legs.  Frequent episodes of falling.  A foot-slapping gait that leads to muscle weakness. DIAGNOSIS  Spinal stenosis is diagnosed with the use of magnetic resonance imaging (MRI) or computed tomography (CT). TREATMENT  Initial therapy for spinal stenosis focuses on the management of the pain and other symptoms associated with the condition. These therapies include:  Practicing postural changes to lessen pressure on your nerves.  Exercises to strengthen the core of your body.  Loss of excess body weight.  The use of nonsteroidal anti-inflammatory medicines to reduce swelling and inflammation in your nerves. When therapies to manage pain are not successful, surgery to treat spinal stenosis may be recommended. This surgery involves removing excess bone, which puts pressure on your nerve roots. During this surgery (laminectomy), the posterior boney arch (lamina) and excess bone around the facet joints are removed.   This information is not intended to replace advice given to you by your health care provider. Make sure you discuss any questions you have with your health care provider.   Document Released: 05/22/2003  Document Revised: 03/22/2014 Document Reviewed: 06/09/2012 Elsevier Interactive Patient Education [employer].

## 2015-09-04 NOTE — Progress Notes (Signed)
Subjective:    Patient ID: Katie [name], female    DOB: [birth year], 75 y.o.   MRN: [id number]  HPI 75 y.o. WF with history of recurrent sinusitis presents with continuing sore throat, sinus issues.  She has history of recurrent sinusitis x 20 years, was treated with ABX/bactrim on 08/26/2015, she states she never felt hundred percent better. She is on zyrtec, singulair. She states she has rhinorrhea, sore throat, no fever, chills, some maxillary sinus pressure/pain. She saw Dr. Lucia Gaskins. She has a history of COPD, she is suppose to be on spiriva but has not been taking it.  She is on coumadin for cAfib, will recheck inr, and also has a history of PAD denies claudication, and has history of AAA, follows with vascular.  She also has history of CHF, weight is stable.  BMI is Body mass index is 35.08 kg/(m^2)., she is working on diet and exercise. Wt Readings from Last 3 Encounters:  09/04/15 198 lb (89.812 kg)  08/26/15 199 lb (90.266 kg)  08/06/15 196 lb (88.905 kg)   She also complains of lower back pain x 5 year intermittent, leaning over a chair makes it better, worse with sitting for long time will feel weakness occ, has hx p neuropathy. Patient denies fever, hematuria, incontinence and saddle anesthesia Takes 6 tylenol a day, helps some.    Blood pressure 110/60, pulse 62, temperature 98.1 F (36.7 C), temperature source Temporal, resp. rate 14, height 5\' 3"  (1.6 m), weight 198 lb (89.812 kg).  Current Outpatient Prescriptions on File Prior to Visit  Medication Sig Dispense Refill  . albuterol (PROVENTIL HFA;VENTOLIN HFA) 108 (90 BASE) MCG/ACT inhaler 2 puffs 5 minutes apart every 4 to 6 hours as needed to rescue asthma (Patient taking differently: Inhale 2 puffs into the lungs 5 minutes apart every 4 to 6 hours as needed to rescue asthma) 1 Inhaler 11  . allopurinol (ZYLOPRIM) 300 MG tablet TAKE 1 TABLET BY MOUTH DAILY 90 tablet 1  . aspirin 81 MG tablet Take 81 mg by mouth daily.       . bisoprolol-hydrochlorothiazide (ZIAC) 5-6.25 MG tablet Take 1 tablet by mouth daily. 90 tablet 3  . Cetirizine HCl 10 MG CAPS Take 1 capsule (10 mg total) by mouth at bedtime. 90 capsule 2  . dexamethasone (DECADRON) 0.1 % ophthalmic suspension Place 1 drop into both eyes daily.    . digoxin (LANOXIN) 0.25 MG tablet TAKE 1 TABLET BY MOUTH DAILY 90 tablet 3  . furosemide (LASIX) 80 MG tablet TAKE 1 TABLET BY MOUTH TWICE A DAY AS NEEDED FOR FLUID 180 tablet 1  . gabapentin (NEURONTIN) 300 MG capsule TAKE ONE CAPSULE BY MOUTH 3 TIMES A DAY 270 capsule 1  . ipratropium (ATROVENT) 0.02 % nebulizer solution Take 0.5 mg by nebulization every 4 (four) hours as needed for wheezing or shortness of breath.     Marland Kitchen ipratropium-albuterol (DUONEB) 0.5-2.5 (3) MG/3ML SOLN Use 3 cc  4 x day or every 4 hours if needed for shortness of breath. 360 mL 99  . levothyroxine (SYNTHROID, LEVOTHROID) 75 MCG tablet Take 1 tablet (75 mcg total) by mouth daily. 90 tablet 1  . meclizine (ANTIVERT) 25 MG tablet Take 1 tablet (25 mg total) by mouth 3 (three) times daily as needed for dizziness. 60 tablet 1  . metolazone (ZAROXOLYN) 2.5 MG tablet Take 1 tablet (2.5 mg total) by mouth daily. 10 tablet 0  . montelukast (SINGULAIR) 10 MG tablet TAKE 1 TABLET EVERY  DAY FOR ALLERGIES 90 tablet 1  . potassium chloride SA (K-DUR,KLOR-CON) 20 MEQ tablet Take 2 tablets (40 mEq total) by mouth daily. 30 tablet 0  . pravastatin (PRAVACHOL) 40 MG tablet TAKE 1 TABLET BY MOUTH AT BEDTIME FOR CHOLESTEROL 90 tablet 1  . warfarin (COUMADIN) 2 MG tablet TAKE 1 TO 2 TABLETS BY MOUTH DAILY OR AS DIRECTED 180 tablet 3   No current facility-administered medications on file prior to visit.   Past Medical History  Diagnosis Date  . Atrial fibrillation ([place])   . Bronchitis, chronic ([place])   . History of Bell's palsy   . Coronary artery disease   . Migraine headache   . Atrial flutter ([place])   . Tobacco abuse   . Osteopenia   . Cervical  dysplasia   . Thyroid disease     Hypo  . Unspecified vitamin D deficiency   . Family history of anesthesia complication     Hard for daughter to wake up after Anesthesia  . Shortness of breath     with exertion  . Cystocele     either on bladder or kidneys patient is unsure but stated physician will watch it  . Seasonal allergies   . Dizziness     If patient gets out of bed too fast  . H/O hiatal hernia   . Rosacea conjunctivitis     Right eye is worse than left eye  . Arthritis   . Gout   . Aortic stenosis     mild AS witm mild to mod AR by 11/2012 echo    Review of Systems  Constitutional: Positive for fatigue. Negative for fever, chills, diaphoresis, activity change, appetite change and unexpected weight change.  HENT: Positive for congestion, ear pain and sore throat.   Respiratory: Positive for cough. Negative for chest tightness, shortness of breath and wheezing.   Cardiovascular: Negative for chest pain, palpitations and leg swelling.  Gastrointestinal: Negative.   Genitourinary: Negative.   Musculoskeletal: Positive for back pain.  Skin: Negative.   Neurological: Positive for numbness. Negative for dizziness, facial asymmetry, light-headedness and headaches.  Psychiatric/Behavioral: Negative.        Objective:   Physical Exam  Constitutional: She is oriented to person, place, and time. She appears well-developed and well-nourished.  HENT:  Head: Normocephalic and atraumatic.  Right Ear: External ear normal.  Nose: Right sinus exhibits maxillary sinus tenderness. Right sinus exhibits no frontal sinus tenderness. Left sinus exhibits maxillary sinus tenderness. Left sinus exhibits no frontal sinus tenderness.  Eyes: Conjunctivae and EOM are normal.  Neck: Normal range of motion. Neck supple.  Cardiovascular: Normal rate.  An irregularly irregular rhythm present. Exam reveals decreased pulses.   Murmur heard.  Systolic murmur is present  Pulmonary/Chest: Effort  normal. No accessory muscle usage. No respiratory distress. She has decreased breath sounds. She has no wheezes. She has no rhonchi. She has no rales.  Abdominal: Soft. Bowel sounds are normal.  Musculoskeletal:       Lumbar back: She exhibits decreased range of motion, tenderness and spasm. She exhibits no bony tenderness, no swelling, no edema, no deformity, no laceration, no pain and normal pulse.  Patient is able to ambulate well. Gait is  Antalgic. Straight leg raising with dorsiflexion negative bilaterally for radicular symptoms. Sensory exam in the legs are abnormal  Bilateral feet. Strength is normal and symmetric in arms and legs. There is not SI tenderness to palpation.  There is paraspinal muscle spasm.  There is  not midline tenderness.  ROM of spine with  limited in all spheres due to pain.   Lymphadenopathy:    She has no cervical adenopathy.  Neurological: She is alert and oriented to person, place, and time. She has normal strength. No cranial nerve deficit or sensory deficit.  Skin: Skin is warm and dry.       Assessment & Plan:   PAD (peripheral artery disease) (HCC) -     BASIC METABOLIC PANEL WITH GFR - follow up with vascular doctor - Control blood pressure, cholesterol, glucose, increase exercise.   Systolic congestive heart failure, unspecified congestive heart failure chronicity (HCC) -     BASIC METABOLIC PANEL WITH GFR -     losartan (COZAAR) 100 MG tablet; Take 1 tablet (100 mg total) by mouth daily. - will switch ACE to ARB due to cough/tickle, lungs clear  Chronic atrial fibrillation (HCC) -     Protime-INR - has been on ABX and prednisone recently, check INR - denies any bleeding/bruising etc.   AAA (abdominal aortic aneurysm) without rupture (HCC) -     Hepatic function panel -Control blood pressure, cholesterol, glucose, increase exercise.   COPD (chronic obstructive pulmonary disease) with chronic bronchitis (HCC) -     losartan (COZAAR) 100 MG  tablet; Take 1 tablet (100 mg total) by mouth daily. -     Tiotropium Bromide Monohydrate (SPIRIVA RESPIMAT) 2.5 MCG/ACT AERS; Use 1 to 2 puff once daily  Morbid obesity, unspecified obesity type ([place])  - long discussion about weight loss, diet, and exercise  Recurrent sinusitis -     CBC with Differential/Platelet -     azelastine (ASTELIN) 0.1 % nasal spray; Place 2 sprays into both nostrils 2 (two) times daily. Use in each nostril as directed - no fever, chills, do not want to start another ABX, check CBC, continue allergy meds and if not better will go back to Dr. Lucia Gaskins or get CT sinuses.   Spinal stenosis of lumbar region  - follow up with ortho   Future Appointments Date Time Provider [place]  12/09/2015 9:00 AM [name], MD [facility] None  04/20/2016 8:30 AM [facility]  04/20/2016 9:30 AM [facility]  04/20/2016 9:45 AM Sharmon Leyden Nickel, NP [facility]

## 2015-09-10 ENCOUNTER — Ambulatory Visit: Payer: Self-pay | Admitting: Internal Medicine

## 2015-09-15 DIAGNOSIS — Z1231 Encounter for screening mammogram for malignant neoplasm of breast: Secondary | ICD-10-CM | POA: Diagnosis not present

## 2015-09-15 DIAGNOSIS — Z780 Asymptomatic menopausal state: Secondary | ICD-10-CM | POA: Diagnosis not present

## 2015-09-16 ENCOUNTER — Other Ambulatory Visit: Payer: Self-pay | Admitting: Physician Assistant

## 2015-09-17 DIAGNOSIS — M1712 Unilateral primary osteoarthritis, left knee: Secondary | ICD-10-CM | POA: Diagnosis not present

## 2015-09-17 DIAGNOSIS — M1711 Unilateral primary osteoarthritis, right knee: Secondary | ICD-10-CM | POA: Diagnosis not present

## 2015-09-17 DIAGNOSIS — M545 Low back pain: Secondary | ICD-10-CM | POA: Diagnosis not present

## 2015-09-19 ENCOUNTER — Ambulatory Visit (INDEPENDENT_AMBULATORY_CARE_PROVIDER_SITE_OTHER): Payer: Medicare Other | Admitting: Internal Medicine

## 2015-09-19 ENCOUNTER — Encounter: Payer: Self-pay | Admitting: Internal Medicine

## 2015-09-19 VITALS — BP 122/72 | HR 72 | Temp 97.3°F | Resp 16 | Ht 63.25 in | Wt 201.0 lb

## 2015-09-19 DIAGNOSIS — E039 Hypothyroidism, unspecified: Secondary | ICD-10-CM | POA: Diagnosis not present

## 2015-09-19 DIAGNOSIS — R7309 Other abnormal glucose: Secondary | ICD-10-CM | POA: Diagnosis not present

## 2015-09-19 DIAGNOSIS — E559 Vitamin D deficiency, unspecified: Secondary | ICD-10-CM

## 2015-09-19 DIAGNOSIS — Z7901 Long term (current) use of anticoagulants: Secondary | ICD-10-CM

## 2015-09-19 DIAGNOSIS — I10 Essential (primary) hypertension: Secondary | ICD-10-CM

## 2015-09-19 DIAGNOSIS — Z79899 Other long term (current) drug therapy: Secondary | ICD-10-CM

## 2015-09-19 DIAGNOSIS — M1000 Idiopathic gout, unspecified site: Secondary | ICD-10-CM

## 2015-09-19 DIAGNOSIS — I482 Chronic atrial fibrillation, unspecified: Secondary | ICD-10-CM

## 2015-09-19 DIAGNOSIS — E782 Mixed hyperlipidemia: Secondary | ICD-10-CM

## 2015-09-19 LAB — PROTIME-INR
INR: 1.3 — AB
PROTHROMBIN TIME: 14 s — AB (ref 9.0–11.5)

## 2015-09-19 LAB — HEMOGLOBIN A1C
Hgb A1c MFr Bld: 5.6 % (ref <5.7)
Mean Plasma Glucose: 114 mg/dL

## 2015-09-19 LAB — TSH: TSH: 6.71 m[IU]/L — AB

## 2015-09-19 NOTE — Patient Instructions (Signed)

## 2015-09-19 NOTE — Progress Notes (Signed)
Patient ID: Katie [name], female   DOB: August 13, 1940, 75 y.o.   MRN: [id number]  [facility]                       [name], M.D.        Uvaldo Bristle. Silverio Lay, P.A.-C       Starlyn Skeans, P.A.-C   [facility]                [address] [place] [id number] Telephone [phone] Telefax [phone] ______________________________________________________________________     This very nice 75 y.o. MWF presents for 3 month follow up with Hypertension, ASHD/Afib, Hyperlipidemia, Pre-Diabetes and Vitamin D Deficiency. Patient has hx/o gout controlled w/Allopurinol. She also is on thyroid replacement  circa 2012.      Patient is treated for HTN circas 1999 & BP has been controlled at home. Today's BP: 122/72 mmHg. In 2005 she was dx'd with Afib & has been on Coumadin since. 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 Patient has had no complaints of any cardiac type chest pain, palpitations, dyspnea/orthopnea/PND, dizziness, claudication, or dependent edema. Two weeks ago, as patient was c/o post nasal drainage, dry hacking cough and "itching" sensation in the back of her throat, she was switched from Benazepril to Losartan and patient denies any change in her sx's. Also at last OV , labs showed a slight increase in BUN/Creat from 20/1.16 (Dec 2016)  to 27/1.56 (Feb 2017) to 33/1.58 (June 2017)  And patient was encouraged to increase po fluids.      Hyperlipidemia is controlled with diet & meds. Patient denies myalgias or other med SE's. Last Lipids were at goal with  Cholesterol 152; HDL 35*; LDL 75; Triglycerides 212 on 03/14/2015.     Also, the patient has history of Morbid Obhesity (BMI 35+) and consequent PreDiabetes since Jan 2014 with A1c 5.8% and has had no symptoms of reactive hypoglycemia, diabetic polys, paresthesias or visual blurring.  Last A1c was still 5.8% on 03/14/2015.     Further,  the patient also has history of Vitamin D Deficiency and supplements vitamin D without any suspected side-effects. Last vitamin D was  47 on 03/14/2015.  Medication Sig  . Allopurinol 300 MG tablet TAKE 1 TABLET BY MOUTH DAILY  . aspirin 81 MG tablet Take 81 mg by mouth daily.    . ASTELIN  nasal spray Place 2 sprays into both nostrils 2 (two) times daily. Use in each nostril as directed  . bisoprolol-hctz [facility]) 5-6.25  Take 1 tablet by mouth daily.  . Cetirizine HCl 10 MG  Take 1 capsule (10 mg total) by mouth at bedtime.  . digoxin  0.25 MG tablet TAKE 1 TABLET BY MOUTH DAILY  . furosemide  80 MG tablet TAKE 1 TABLET BY MOUTH TWICE A DAY AS NEEDED FOR FLUID  . gabapentin  300 MG  TAKE ONE CAPSULE BY MOUTH 3 TIMES A DAY  . ATROVENT  nebulizer soln Take 0.5 mg by nebulization every 4 (four) hours as needed for wheezing or shortness of breath.   . DUONEB  SOLN Use 3 cc  4 x day or every 4 hours if needed for shortness of breath.  . levothyroxine  75 MCG tablet Take 1 tablet (75 mcg total) by mouth daily.  Marland Kitchen losartan  100 MG  Take 1 tablet (100 mg total) by mouth daily. And back to Benazepril 20 mg /da  . meclizine  25 MG Take 1 tablet (25 mg total) by mouth 3 (three) times daily as needed for dizziness.  . metolazone  2.5 MG tablet Take 1 tablet (2.5 mg total) by mouth daily.  . montelukast  10 MG TAKE 1 TABLET EVERY DAY FOR ALLERGIES  . potassium chloride  20 MEQ  Take 2 tablets (40 mEq total) by mouth daily.  . pravastatin40 MG  TAKE 1 TABLET BY MOUTH AT BEDTIME FOR CHOLESTEROL  . prochlorperazine  5 MG tablet TAKE 1 TABLET BY MOUTH 3 TIMES A DAY FOR VERTIGO OR NAUSEA  . warfarin  2 MG tablet TAKE 1 TO 2 TAB DAILY OR AS DIRECTED - for last 2 weeks on 2 mg x 4 days & 1 mg x 3 days/week = 11 mg/week  . albuterol  HFA inhaler 2 puffs 5 minutes apart every 4 to 6 hours as needed to rescue asthma   . dexamethasone 0.1 % ophth susp Place 1 drop into both eyes daily.  Marland Kitchen SPIRIVA RESPIMAT Use 1 to 2  puff once daily   Allergies  Allergen Reactions  . Advair Diskus [Fluticasone-Salmeterol] Other (See Comments)    SKIN CHANGES.  Can tolerate low dose of prednisone without complications  . Amiodarone Other (See Comments)    PULMONARY TOXICITY  . Codeine     unknown  . Diovan [Valsartan] Other (See Comments)    HYPOTENSION  . Doxycycline Diarrhea and Other (See Comments)    VISUAL DISTURBANCE  . Flexeril [Cyclobenzaprine] Other (See Comments)    FATIGUE  . Keflex [Cephalexin] Diarrhea  . Verapamil Other (See Comments)    EDEMA   PMHx:   Past Medical History  Diagnosis Date  . Atrial fibrillation ([place])   . Bronchitis, chronic ([place])   . History of Bell's palsy   . Coronary artery disease   . Migraine headache   . Atrial flutter ([place])   . Tobacco abuse   . Osteopenia   . Cervical dysplasia   . Thyroid disease     Hypo  . Unspecified vitamin D deficiency   . Family history of anesthesia complication     Hard for daughter to wake up after Anesthesia  . Shortness of breath     with exertion  . Cystocele     either on bladder or kidneys patient is unsure but stated physician will watch it  . Seasonal allergies   . Dizziness     If patient gets out of bed too fast  . H/O hiatal hernia   . Rosacea conjunctivitis     Right eye is worse than left eye  . Arthritis   . Gout   . Aortic stenosis     mild AS witm mild to mod AR by 11/2012 echo   Immunization History  Administered Date(s) Administered  . Influenza, High Dose Seasonal PF 12/20/2013, 12/09/2014  . Influenza-Unspecified 01/02/2013  . Pneumococcal Conjugate-13 01/23/2014  . Pneumococcal-Unspecified 03/15/1993, 05/31/2008  . Td 03/16/2007  . Varicella 02/19/2008   Past Surgical History  Procedure Laterality Date  . Broken leg Left 1970s  . Laser vein procedure    . Refractive surgery    . Colonoscopy    . Cholecystectomy  1972  . Tonsillectomy    . Eye surgery Bilateral   . Cardiac catheterization    .  Abdominal aortic endovascular stent graft N/A 04/11/2013  Procedure: ABDOMINAL AORTIC ENDOVASCULAR STENT GRAFT WITH RIGHT FEMORAL PATCH ANGIOPLASTY;  Surgeon: Mal Misty, MD;  Location: [facility] OR;  Service: Vascular;  Laterality: N/A;  . Breast surgery      LEFT BREAST BIOPSY   FHx:    Reviewed / unchanged  SHx:    Reviewed / unchanged  Systems Review:  Constitutional: Denies fever, chills, wt changes, headaches, insomnia, fatigue, night sweats, change in appetite. Eyes: Denies redness, blurred vision, diplopia, discharge, itchy, watery eyes.  ENT: Denies discharge, congestion, post nasal drip, epistaxis, sore throat, earache, hearing loss, dental pain, tinnitus, vertigo, sinus pain, snoring.  CV: Denies chest pain, palpitations, irregular heartbeat, syncope, dyspnea, diaphoresis, orthopnea, PND, claudication or edema. Respiratory: denies cough, dyspnea, DOE, pleurisy, hoarseness, laryngitis, wheezing.  Gastrointestinal: Denies dysphagia, odynophagia, heartburn, reflux, water brash, abdominal pain or cramps, nausea, vomiting, bloating, diarrhea, constipation, hematemesis, melena, hematochezia  or hemorrhoids. Genitourinary: Denies dysuria, frequency, urgency, nocturia, hesitancy, discharge, hematuria or flank pain. Musculoskeletal: Denies arthralgias, myalgias, stiffness, jt. swelling, pain, limping or strain/sprain.  Skin: Denies pruritus, rash, hives, warts, acne, eczema or change in skin lesion(s). Neuro: No weakness, tremor, incoordination, spasms, paresthesia or pain. Psychiatric: Denies confusion, memory loss or sensory loss. Endo: Denies change in weight, skin or hair change.  Heme/Lymph: No excessive bleeding, bruising or enlarged lymph nodes.  Physical Exam  BP 122/72   Pulse 72  Temp 97.3 F   Resp 16  Ht 5' 3.25" Wt 201 lb    BMI 35.30   Appears well nourished and in no distress. Eyes: PERRLA, EOMs, conjunctiva no swelling or erythema. Sinuses: No frontal/maxillary  tenderness ENT/Mouth: EAC's clear, TM's nl w/o erythema, bulging. Nares clear w/o erythema, swelling, exudates. Oropharynx clear without erythema or exudates. Oral hygiene is good. Tongue normal, non obstructing. Hearing intact.  Neck: Supple. Thyroid nl. Car 2+/2+ without bruits, nodes or JVD. Chest: Respirations nl with BS clear & equal w/o rales, rhonchi, wheezing or stridor.  Cor: Heart sounds softw/ sl irregular rate and rhythm without sig. murmurs, gallops, clicks, or rubs. Peripheral pulses normal and equal  without edema.  Abdomen: Soft & bowel sounds normal. Non-tender w/o guarding, rebound, hernias, masses, or organomegaly.  Lymphatics: Unremarkable.  Musculoskeletal: Full ROM all peripheral extremities, joint stability, 5/5 strength, and normal gait.  Skin: Warm, dry without exposed rashes, lesions or ecchymosis apparent.  Neuro: Cranial nerves intact, reflexes equal bilaterally. Sensory-motor testing grossly intact. Tendon reflexes grossly intact.  Pysch: Alert & oriented x 3.  Insight and judgement nl & appropriate. No ideations.  Assessment and Plan:  1. Essential hypertension  - Continue medication, monitor blood pressure at home. Continue DASH diet. Reminder to go to the ER if any CP, SOB, nausea, dizziness, severe HA, changes vision/speech, left arm numbness and tingling and jaw pain. - TSH - To use up her new Losartin and then resume her Benazepril 20 mg.   2. Hyperlipidemia  - Continue diet/meds, exercise,& lifestyle modifications. Continue monitor periodic cholesterol/liver & renal functions  - Lipid panel - TSH  3. Other abnormal glucose  - Continue diet, exercise, lifestyle modifications. Monitor appropriate labs. - Hemoglobin A1c - Insulin, random  4. Vitamin D deficiency  - Continue supplementation. - VITAMIN D 25 Hydroxy   5. Hypothyroidism  - TSH  6. Chronic atrial fibrillation (HCC)  - Digoxin level  7. Long term current use of anticoagulant  therapy  - Protime-INR  8. Medication management  - BASIC METABOLIC PANEL WITH GFR - Magnesium - Digoxin level  9. Idiopathic gout  - Uric acid   Recommended regular exercise, BP monitoring, weight control, and discussed med and SE's. Recommended labs to assess and monitor clinical status. Further disposition pending results of labs. Over 30 minutes of exam, counseling, chart review was performed

## 2015-09-20 LAB — BASIC METABOLIC PANEL WITH GFR
BUN: 14 mg/dL (ref 7–25)
CHLORIDE: 102 mmol/L (ref 98–110)
CO2: 23 mmol/L (ref 20–31)
Calcium: 9.5 mg/dL (ref 8.6–10.4)
Creat: 1.43 mg/dL — ABNORMAL HIGH (ref 0.60–0.93)
GFR, EST AFRICAN AMERICAN: 42 mL/min — AB (ref >=60)
GFR, EST NON AFRICAN AMERICAN: 36 mL/min — AB (ref >=60)
Glucose, Bld: 172 mg/dL — ABNORMAL HIGH (ref 65–99)
POTASSIUM: 4.1 mmol/L (ref 3.5–5.3)
SODIUM: 140 mmol/L (ref 135–146)

## 2015-09-20 LAB — MAGNESIUM: MAGNESIUM: 1.7 mg/dL (ref 1.5–2.5)

## 2015-09-20 LAB — LIPID PANEL
CHOL/HDL RATIO: 4.6 ratio (ref <=5.0)
CHOLESTEROL: 124 mg/dL — AB (ref 125–200)
HDL: 27 mg/dL — AB (ref >=46)
LDL Cholesterol: 56 mg/dL (ref <130)
Triglycerides: 203 mg/dL — ABNORMAL HIGH (ref <150)
VLDL: 41 mg/dL — ABNORMAL HIGH (ref <30)

## 2015-09-20 LAB — INSULIN, RANDOM: INSULIN: 385.8 u[IU]/mL — AB (ref 2.0–19.6)

## 2015-09-20 LAB — DIGOXIN LEVEL: Digoxin Level: 0.9 ug/L (ref 0.8–2.0)

## 2015-09-20 LAB — URIC ACID: URIC ACID, SERUM: 5.1 mg/dL (ref 2.5–7.0)

## 2015-09-20 LAB — VITAMIN D 25 HYDROXY (VIT D DEFICIENCY, FRACTURES): VIT D 25 HYDROXY: 55 ng/mL (ref 30–100)

## 2015-09-23 NOTE — Telephone Encounter (Signed)
Spoke with pt & informed pt that bone density test was normal.

## 2015-10-14 ENCOUNTER — Ambulatory Visit (INDEPENDENT_AMBULATORY_CARE_PROVIDER_SITE_OTHER): Payer: Medicare Other | Admitting: Internal Medicine

## 2015-10-14 ENCOUNTER — Encounter: Payer: Self-pay | Admitting: Internal Medicine

## 2015-10-14 VITALS — BP 124/72 | HR 80 | Temp 98.8°F | Resp 14 | Ht 63.25 in | Wt 201.0 lb

## 2015-10-14 DIAGNOSIS — Z7901 Long term (current) use of anticoagulants: Secondary | ICD-10-CM | POA: Diagnosis not present

## 2015-10-14 DIAGNOSIS — I739 Peripheral vascular disease, unspecified: Secondary | ICD-10-CM | POA: Diagnosis not present

## 2015-10-14 DIAGNOSIS — I482 Chronic atrial fibrillation, unspecified: Secondary | ICD-10-CM

## 2015-10-14 NOTE — Progress Notes (Signed)
Assessment and Plan:   1. Long-term (current) use of anticoagulants Cont coumadin - Protime-INR  2. PAD (peripheral artery disease) ([place]) -currently maxed out on medical therapy and is followed by Dr. Kellie Simmering  3. PVD (peripheral vascular disease) with claudication ([place]) -currently maxed out on medication therapy  4. Chronic atrial fibrillation (HCC) -cont meds -without RVR   HPI 75 y.o.female presents for 1 month follow up of coumadin check.  She was also referred to [place] by [facility] due to abnormal ABIs in her legs which is being monitored by vascular surgery.  She is currently maxed out on her medical therapy for PAD.    She reports that is doing well and is having no issues with her coumadin.   Patient reports that they have been doing well.  female is taking their medication.  They are not having difficulty with their medications.  They report no adverse reactions such as bleeding, melena, hematochezia, epistaxis, falls, or head injuries.      Past Medical History:  Diagnosis Date  . Aortic stenosis    mild AS witm mild to mod AR by 11/2012 echo  . Arthritis   . Atrial fibrillation ([place])   . Atrial flutter ([place])   . Bronchitis, chronic ([place])   . Cervical dysplasia   . Coronary artery disease   . Cystocele    either on bladder or kidneys patient is unsure but stated physician will watch it  . Dizziness    If patient gets out of bed too fast  . Family history of anesthesia complication    Hard for daughter to wake up after Anesthesia  . Gout   . H/O hiatal hernia   . History of Bell's palsy   . Migraine headache   . Osteopenia   . Rosacea conjunctivitis    Right eye is worse than left eye  . Seasonal allergies   . Shortness of breath    with exertion  . Thyroid disease    Hypo  . Tobacco abuse   . Unspecified vitamin D deficiency      Allergies  Allergen Reactions  . Advair Diskus [Fluticasone-Salmeterol] Other (See Comments)    SKIN CHANGES.  Can tolerate low dose of  prednisone without complications  . Amiodarone Other (See Comments)    PULMONARY TOXICITY  . Codeine     unknown  . Diovan [Valsartan] Other (See Comments)    HYPOTENSION  . Doxycycline Diarrhea and Other (See Comments)    VISUAL DISTURBANCE  . Flexeril [Cyclobenzaprine] Other (See Comments)    FATIGUE  . Keflex [Cephalexin] Diarrhea  . Verapamil Other (See Comments)    EDEMA      Current Outpatient Prescriptions on File Prior to Visit  Medication Sig Dispense Refill  . allopurinol (ZYLOPRIM) 300 MG tablet TAKE 1 TABLET BY MOUTH DAILY 90 tablet 1  . aspirin 81 MG tablet Take 81 mg by mouth daily.      Marland Kitchen azelastine (ASTELIN) 0.1 % nasal spray Place 2 sprays into both nostrils 2 (two) times daily. Use in each nostril as directed 30 mL 2  . bisoprolol-hydrochlorothiazide (ZIAC) 5-6.25 MG tablet Take 1 tablet by mouth daily. 90 tablet 3  . Cetirizine HCl 10 MG CAPS Take 1 capsule (10 mg total) by mouth at bedtime. 90 capsule 2  . digoxin (LANOXIN) 0.25 MG tablet TAKE 1 TABLET BY MOUTH DAILY 90 tablet 3  . furosemide (LASIX) 80 MG tablet TAKE 1 TABLET BY MOUTH TWICE A DAY AS NEEDED  FOR FLUID 180 tablet 1  . gabapentin (NEURONTIN) 300 MG capsule TAKE ONE CAPSULE BY MOUTH 3 TIMES A DAY 270 capsule 1  . ipratropium (ATROVENT) 0.02 % nebulizer solution Take 0.5 mg by nebulization every 4 (four) hours as needed for wheezing or shortness of breath.     Marland Kitchen ipratropium-albuterol (DUONEB) 0.5-2.5 (3) MG/3ML SOLN Use 3 cc  4 x day or every 4 hours if needed for shortness of breath. 360 mL 99  . levothyroxine (SYNTHROID, LEVOTHROID) 75 MCG tablet Take 1 tablet (75 mcg total) by mouth daily. 90 tablet 1  . losartan (COZAAR) 100 MG tablet Take 1 tablet (100 mg total) by mouth daily. 30 tablet 11  . meclizine (ANTIVERT) 25 MG tablet Take 1 tablet (25 mg total) by mouth 3 (three) times daily as needed for dizziness. 60 tablet 1  . metolazone (ZAROXOLYN) 2.5 MG tablet Take 1 tablet (2.5 mg total) by mouth  daily. 10 tablet 0  . montelukast (SINGULAIR) 10 MG tablet TAKE 1 TABLET EVERY DAY FOR ALLERGIES 90 tablet 1  . potassium chloride SA (K-DUR,KLOR-CON) 20 MEQ tablet Take 2 tablets (40 mEq total) by mouth daily. 30 tablet 0  . pravastatin (PRAVACHOL) 40 MG tablet TAKE 1 TABLET BY MOUTH AT BEDTIME FOR CHOLESTEROL 90 tablet 1  . prochlorperazine (COMPAZINE) 5 MG tablet TAKE 1 TABLET BY MOUTH 3 TIMES A DAY FOR VERTIGO OR NAUSEA 90 tablet 1  . warfarin (COUMADIN) 2 MG tablet TAKE 1 TO 2 TABLETS BY MOUTH DAILY OR AS DIRECTED 180 tablet 3  . albuterol (PROVENTIL HFA;VENTOLIN HFA) 108 (90 BASE) MCG/ACT inhaler 2 puffs 5 minutes apart every 4 to 6 hours as needed to rescue asthma (Patient taking differently: Inhale 2 puffs into the lungs 5 minutes apart every 4 to 6 hours as needed to rescue asthma) 1 Inhaler 11   No current facility-administered medications on file prior to visit.     ROS: all negative except above.   Physical Exam: Filed Weights   10/14/15 1347  Weight: 201 lb (91.2 kg)   BP 124/72   Pulse 80   Temp 98.8 F (37.1 C) (Temporal)   Resp 14   Ht 5' 3.25" (1.607 m)   Wt 201 lb (91.2 kg)   SpO2 98%   BMI 35.32 kg/m  General Appearance: Well developed well nourished, non-toxic appearing in no apparent distress. Eyes: PERRLA, EOMs, conjunctiva w/ no swelling or erythema or discharge Sinuses: No Frontal/maxillary tenderness ENT/Mouth: Ear canals clear without swelling or erythema.  TM's normal bilaterally with no retractions, bulging, or loss of landmarks.   Neck: Supple, thyroid normal, no notable JVD  Respiratory: Respiratory effort normal, Clear breath sounds anteriorly and posteriorly bilaterally without rales, rhonchi, wheezing or stridor. No retractions or accessory muscle usage. Cardio: RRR with no MRGs.   Abdomen: Soft, + BS.  Non tender, no guarding, rebound, hernias, masses.  Musculoskeletal: Full ROM, 5/5 strength, normal gait.  Skin: Warm, dry without rashes  Neuro:  Awake and oriented X 3, Cranial nerves intact. Normal muscle tone, no cerebellar symptoms. Sensation intact.  Psych: normal affect, Insight and Judgment appropriate.     Starlyn Skeans, PA-C 2:05 PM [facility]

## 2015-10-15 ENCOUNTER — Telehealth: Payer: Self-pay | Admitting: *Deleted

## 2015-10-15 DIAGNOSIS — M1711 Unilateral primary osteoarthritis, right knee: Secondary | ICD-10-CM | POA: Diagnosis not present

## 2015-10-15 DIAGNOSIS — M25572 Pain in left ankle and joints of left foot: Secondary | ICD-10-CM | POA: Diagnosis not present

## 2015-10-15 DIAGNOSIS — M1712 Unilateral primary osteoarthritis, left knee: Secondary | ICD-10-CM | POA: Diagnosis not present

## 2015-10-15 LAB — PROTIME-INR
INR: 2.5 — ABNORMAL HIGH
PROTHROMBIN TIME: 25.8 s — AB (ref 9.0–11.5)

## 2015-10-15 NOTE — Telephone Encounter (Signed)
Pt aware of normal lab results.  

## 2015-10-27 ENCOUNTER — Ambulatory Visit: Payer: Self-pay | Admitting: Internal Medicine

## 2015-11-01 ENCOUNTER — Encounter (INDEPENDENT_AMBULATORY_CARE_PROVIDER_SITE_OTHER): Payer: Self-pay

## 2015-11-03 ENCOUNTER — Other Ambulatory Visit: Payer: Self-pay | Admitting: Internal Medicine

## 2015-11-05 DIAGNOSIS — H10503 Unspecified blepharoconjunctivitis, bilateral: Secondary | ICD-10-CM | POA: Diagnosis not present

## 2015-11-05 DIAGNOSIS — H04123 Dry eye syndrome of bilateral lacrimal glands: Secondary | ICD-10-CM | POA: Diagnosis not present

## 2015-11-05 DIAGNOSIS — L719 Rosacea, unspecified: Secondary | ICD-10-CM | POA: Diagnosis not present

## 2015-11-05 DIAGNOSIS — L718 Other rosacea: Secondary | ICD-10-CM | POA: Diagnosis not present

## 2015-11-14 ENCOUNTER — Other Ambulatory Visit: Payer: Self-pay | Admitting: Internal Medicine

## 2015-11-14 ENCOUNTER — Ambulatory Visit (INDEPENDENT_AMBULATORY_CARE_PROVIDER_SITE_OTHER): Payer: Medicare Other | Admitting: Internal Medicine

## 2015-11-14 ENCOUNTER — Encounter: Payer: Self-pay | Admitting: Internal Medicine

## 2015-11-14 VITALS — BP 126/80 | HR 62 | Temp 98.0°F | Resp 18 | Ht 63.25 in | Wt 202.0 lb

## 2015-11-14 DIAGNOSIS — I482 Chronic atrial fibrillation, unspecified: Secondary | ICD-10-CM

## 2015-11-14 DIAGNOSIS — Z23 Encounter for immunization: Secondary | ICD-10-CM | POA: Diagnosis not present

## 2015-11-14 DIAGNOSIS — Z7901 Long term (current) use of anticoagulants: Secondary | ICD-10-CM | POA: Diagnosis not present

## 2015-11-14 LAB — PROTIME-INR
INR: 4.2 — ABNORMAL HIGH
PROTHROMBIN TIME: 41.8 s — AB (ref 9.0–11.5)

## 2015-11-14 MED ORDER — PREDNISONE 20 MG PO TABS: ORAL_TABLET | ORAL | 0 refills | Status: DC

## 2015-11-14 NOTE — Progress Notes (Signed)
Assessment and Plan:   1. Chronic atrial fibrillation (HCC) -cont rate control -cont couamdin -given rate of antibiotic use for chronic sinusitis recommend that the patient be changed to a NOAC if financially feasible as she has been subtherapuetic recently on coumadin  2. Encounter for current long-term use of anticoagulants -cont couamdin for now -cut in half while on zpak - Protime-INR  3. Need for prophylactic vaccination and inoculation against influenza  - Flu vaccine HIGH DOSE PF (Fluzone High dose)      HPI 75 y.o.female presents for 1 month follow up of long term anticoagulation secondary to atrial fibrillation. Patient reports that they have been doing well.  female is taking their medication.  They are not having difficulty with their medications.  They report no adverse reactions such as bleeding, melena, hematochezia, epistaxis, rash, falling hitting her head.  She has been on azithromycin for her eye infection.  She reports that she is supposed to take this for 30 days.  Her current dose of coumadin is taking 2 mg daily.  She did not cut her dose in half.  She reports that she is still on the full dose.       Past Medical History:  Diagnosis Date  . Aortic stenosis    mild AS witm mild to mod AR by 11/2012 echo  . Arthritis   . Atrial fibrillation ([place])   . Atrial flutter ([place])   . Bronchitis, chronic ([place])   . Cervical dysplasia   . Coronary artery disease   . Cystocele    either on bladder or kidneys patient is unsure but stated physician will watch it  . Dizziness    If patient gets out of bed too fast  . Family history of anesthesia complication    Hard for daughter to wake up after Anesthesia  . Gout   . H/O hiatal hernia   . History of Bell's palsy   . Migraine headache   . Osteopenia   . Rosacea conjunctivitis    Right eye is worse than left eye  . Seasonal allergies   . Shortness of breath    with exertion  . Thyroid disease    Hypo  . Tobacco  abuse   . Unspecified vitamin D deficiency      Allergies  Allergen Reactions  . Advair Diskus [Fluticasone-Salmeterol] Other (See Comments)    SKIN CHANGES.  Can tolerate low dose of prednisone without complications  . Amiodarone Other (See Comments)    PULMONARY TOXICITY  . Codeine     unknown  . Diovan [Valsartan] Other (See Comments)    HYPOTENSION  . Doxycycline Diarrhea and Other (See Comments)    VISUAL DISTURBANCE  . Flexeril [Cyclobenzaprine] Other (See Comments)    FATIGUE  . Keflex [Cephalexin] Diarrhea  . Verapamil Other (See Comments)    EDEMA      Current Outpatient Prescriptions on File Prior to Visit  Medication Sig Dispense Refill  . albuterol (PROVENTIL HFA;VENTOLIN HFA) 108 (90 BASE) MCG/ACT inhaler 2 puffs 5 minutes apart every 4 to 6 hours as needed to rescue asthma (Patient taking differently: Inhale 2 puffs into the lungs 5 minutes apart every 4 to 6 hours as needed to rescue asthma) 1 Inhaler 11  . allopurinol (ZYLOPRIM) 300 MG tablet TAKE 1 TABLET BY MOUTH DAILY 90 tablet 1  . aspirin 81 MG tablet Take 81 mg by mouth daily.      Marland Kitchen azelastine (ASTELIN) 0.1 % nasal spray Place 2  sprays into both nostrils 2 (two) times daily. Use in each nostril as directed 30 mL 2  . bisoprolol-hydrochlorothiazide (ZIAC) 5-6.25 MG tablet Take 1 tablet by mouth daily. 90 tablet 3  . Cetirizine HCl 10 MG CAPS Take 1 capsule (10 mg total) by mouth at bedtime. 90 capsule 2  . digoxin (LANOXIN) 0.25 MG tablet TAKE 1 TABLET BY MOUTH DAILY 90 tablet 3  . furosemide (LASIX) 80 MG tablet TAKE 1 TABLET BY MOUTH TWICE A DAY AS NEEDED FOR FLUID 180 tablet 1  . ipratropium (ATROVENT) 0.02 % nebulizer solution Take 0.5 mg by nebulization every 4 (four) hours as needed for wheezing or shortness of breath.     Marland Kitchen ipratropium-albuterol (DUONEB) 0.5-2.5 (3) MG/3ML SOLN Use 3 cc  4 x day or every 4 hours if needed for shortness of breath. 360 mL 99  . levothyroxine (SYNTHROID, LEVOTHROID) 75  MCG tablet Take 1 tablet (75 mcg total) by mouth daily. 90 tablet 1  . losartan (COZAAR) 100 MG tablet Take 1 tablet (100 mg total) by mouth daily. 30 tablet 11  . meclizine (ANTIVERT) 25 MG tablet Take 1 tablet (25 mg total) by mouth 3 (three) times daily as needed for dizziness. 60 tablet 1  . metolazone (ZAROXOLYN) 2.5 MG tablet Take 1 tablet (2.5 mg total) by mouth daily. 10 tablet 0  . montelukast (SINGULAIR) 10 MG tablet TAKE 1 TABLET EVERY DAY FOR ALLERGIES 90 tablet 1  . potassium chloride SA (K-DUR,KLOR-CON) 20 MEQ tablet Take 2 tablets (40 mEq total) by mouth daily. 30 tablet 0  . pravastatin (PRAVACHOL) 40 MG tablet TAKE 1 TABLET BY MOUTH AT BEDTIME FOR CHOLESTEROL 90 tablet 1  . prochlorperazine (COMPAZINE) 5 MG tablet TAKE 1 TABLET BY MOUTH 3 TIMES A DAY FOR VERTIGO OR NAUSEA 90 tablet 1  . warfarin (COUMADIN) 2 MG tablet TAKE 1 TO 2 TABLETS BY MOUTH DAILY OR AS DIRECTED 180 tablet 3   No current facility-administered medications on file prior to visit.     ROS: all negative except above.   Physical Exam: There were no vitals filed for this visit. There were no vitals taken for this visit. General Appearance: Well developed well nourished, non-toxic appearing in no apparent distress. Eyes: PERRLA, EOMs, conjunctiva w/ no swelling or erythema or discharge Sinuses: No Frontal/maxillary tenderness ENT/Mouth: Ear canals clear without swelling or erythema.  TM's normal bilaterally with no retractions, bulging, or loss of landmarks.   Neck: Supple, thyroid normal, no notable JVD  Respiratory: Respiratory effort normal, Clear breath sounds anteriorly and posteriorly bilaterally without rales, rhonchi, wheezing or stridor. No retractions or accessory muscle usage. Cardio: Ireg ireg with no RGs. 2/6 AS murmur present over 2nd ICS bilaterally   Abdomen: Soft, + BS.  Non tender, no guarding, rebound, hernias, masses.  Musculoskeletal: Full ROM, 5/5 strength, normal gait.  Skin: Warm, dry  without rashes  Neuro: Awake and oriented X 3, Cranial nerves intact. Normal muscle tone, no cerebellar symptoms. Sensation intact.  Psych: normal affect, Insight and Judgment appropriate.     Starlyn Skeans, PA-C 9:23 AM [facility]

## 2015-11-17 ENCOUNTER — Encounter: Payer: Self-pay | Admitting: *Deleted

## 2015-11-19 DIAGNOSIS — Z961 Presence of intraocular lens: Secondary | ICD-10-CM | POA: Diagnosis not present

## 2015-11-19 DIAGNOSIS — H01025 Squamous blepharitis left lower eyelid: Secondary | ICD-10-CM | POA: Diagnosis not present

## 2015-11-19 DIAGNOSIS — H01022 Squamous blepharitis right lower eyelid: Secondary | ICD-10-CM | POA: Diagnosis not present

## 2015-11-19 DIAGNOSIS — H01024 Squamous blepharitis left upper eyelid: Secondary | ICD-10-CM | POA: Diagnosis not present

## 2015-11-19 DIAGNOSIS — H01021 Squamous blepharitis right upper eyelid: Secondary | ICD-10-CM | POA: Diagnosis not present

## 2015-11-25 ENCOUNTER — Other Ambulatory Visit: Payer: Self-pay | Admitting: Internal Medicine

## 2015-11-25 ENCOUNTER — Other Ambulatory Visit: Payer: Medicare Other

## 2015-11-25 DIAGNOSIS — Z7901 Long term (current) use of anticoagulants: Secondary | ICD-10-CM

## 2015-11-26 LAB — PROTIME-INR
INR: 1.1
Prothrombin Time: 12.1 s — ABNORMAL HIGH (ref 9.0–11.5)

## 2015-11-27 ENCOUNTER — Other Ambulatory Visit: Payer: Self-pay | Admitting: Internal Medicine

## 2015-12-09 ENCOUNTER — Encounter: Payer: Self-pay | Admitting: Internal Medicine

## 2015-12-09 ENCOUNTER — Ambulatory Visit: Payer: Medicare Other | Admitting: Internal Medicine

## 2015-12-09 VITALS — BP 120/76 | HR 72 | Temp 97.3°F | Resp 16 | Ht 63.0 in | Wt 202.6 lb

## 2015-12-09 DIAGNOSIS — Z1211 Encounter for screening for malignant neoplasm of colon: Secondary | ICD-10-CM

## 2015-12-09 DIAGNOSIS — I482 Chronic atrial fibrillation, unspecified: Secondary | ICD-10-CM

## 2015-12-09 DIAGNOSIS — Z136 Encounter for screening for cardiovascular disorders: Secondary | ICD-10-CM

## 2015-12-09 DIAGNOSIS — Z0001 Encounter for general adult medical examination with abnormal findings: Secondary | ICD-10-CM | POA: Diagnosis not present

## 2015-12-09 DIAGNOSIS — Z7901 Long term (current) use of anticoagulants: Secondary | ICD-10-CM | POA: Diagnosis not present

## 2015-12-09 DIAGNOSIS — Z79899 Other long term (current) drug therapy: Secondary | ICD-10-CM

## 2015-12-09 DIAGNOSIS — R7309 Other abnormal glucose: Secondary | ICD-10-CM

## 2015-12-09 DIAGNOSIS — E782 Mixed hyperlipidemia: Secondary | ICD-10-CM

## 2015-12-09 DIAGNOSIS — R6889 Other general symptoms and signs: Secondary | ICD-10-CM | POA: Diagnosis not present

## 2015-12-09 DIAGNOSIS — E039 Hypothyroidism, unspecified: Secondary | ICD-10-CM

## 2015-12-09 DIAGNOSIS — K219 Gastro-esophageal reflux disease without esophagitis: Secondary | ICD-10-CM

## 2015-12-09 DIAGNOSIS — J449 Chronic obstructive pulmonary disease, unspecified: Secondary | ICD-10-CM

## 2015-12-09 DIAGNOSIS — I10 Essential (primary) hypertension: Secondary | ICD-10-CM

## 2015-12-09 DIAGNOSIS — E559 Vitamin D deficiency, unspecified: Secondary | ICD-10-CM

## 2015-12-09 LAB — CBC WITH DIFFERENTIAL/PLATELET
BASOS ABS: 0 {cells}/uL (ref 0–200)
BASOS PCT: 0 %
EOS PCT: 5 %
Eosinophils Absolute: 355 cells/uL (ref 15–500)
HCT: 45.4 % — ABNORMAL HIGH (ref 35.0–45.0)
Hemoglobin: 15.2 g/dL (ref 11.7–15.5)
LYMPHS PCT: 30 %
Lymphs Abs: 2130 cells/uL (ref 850–3900)
MCH: 31.3 pg (ref 27.0–33.0)
MCHC: 33.5 g/dL (ref 32.0–36.0)
MCV: 93.4 fL (ref 80.0–100.0)
MONOS PCT: 11 %
MPV: 10.1 fL (ref 7.5–12.5)
Monocytes Absolute: 781 cells/uL (ref 200–950)
NEUTROS ABS: 3834 {cells}/uL (ref 1500–7800)
Neutrophils Relative %: 54 %
PLATELETS: 251 10*3/uL (ref 140–400)
RBC: 4.86 MIL/uL (ref 3.80–5.10)
RDW: 14.6 % (ref 11.0–15.0)
WBC: 7.1 10*3/uL (ref 3.8–10.8)

## 2015-12-09 LAB — BASIC METABOLIC PANEL WITH GFR
BUN: 26 mg/dL — ABNORMAL HIGH (ref 7–25)
CALCIUM: 9.7 mg/dL (ref 8.6–10.4)
CO2: 20 mmol/L (ref 20–31)
Chloride: 103 mmol/L (ref 98–110)
Creat: 1.65 mg/dL — ABNORMAL HIGH (ref 0.60–0.93)
GFR, EST AFRICAN AMERICAN: 35 mL/min — AB (ref >=60)
GFR, EST NON AFRICAN AMERICAN: 31 mL/min — AB (ref >=60)
GLUCOSE: 118 mg/dL — AB (ref 65–99)
POTASSIUM: 4.3 mmol/L (ref 3.5–5.3)
SODIUM: 140 mmol/L (ref 135–146)

## 2015-12-09 LAB — HEPATIC FUNCTION PANEL
ALBUMIN: 3.8 g/dL (ref 3.6–5.1)
ALK PHOS: 26 U/L — AB (ref 33–130)
ALT: 13 U/L (ref 6–29)
AST: 16 U/L (ref 10–35)
BILIRUBIN TOTAL: 0.5 mg/dL (ref 0.2–1.2)
Bilirubin, Direct: 0.1 mg/dL (ref <=0.2)
Indirect Bilirubin: 0.4 mg/dL (ref 0.2–1.2)
TOTAL PROTEIN: 5.9 g/dL — AB (ref 6.1–8.1)

## 2015-12-09 LAB — TSH: TSH: 8.47 m[IU]/L — AB

## 2015-12-09 LAB — LIPID PANEL
CHOL/HDL RATIO: 5.7 ratio — AB (ref <=5.0)
CHOLESTEROL: 137 mg/dL (ref 125–200)
HDL: 24 mg/dL — AB (ref >=46)
LDL Cholesterol: 61 mg/dL (ref <130)
TRIGLYCERIDES: 258 mg/dL — AB (ref <150)
VLDL: 52 mg/dL — ABNORMAL HIGH (ref <30)

## 2015-12-09 LAB — MAGNESIUM: MAGNESIUM: 2 mg/dL (ref 1.5–2.5)

## 2015-12-09 LAB — PROTIME-INR
INR: 2.8 — AB
PROTHROMBIN TIME: 28.8 s — AB (ref 9.0–11.5)

## 2015-12-09 NOTE — Progress Notes (Signed)
[facility] [name], M.D.    Katie [name]. Silverio Lay, P.A.-C      Starlyn Skeans, P.A.-C  [facility]                [address] [place] [address] Telephone [phone] Telefax [phone]  Annual Screening/Preventative Visit & Comprehensive Evaluation &  Examination     This very nice 75 y.o. MWF presents for a Screening/Preventative Visit & comprehensive evaluation and management of multiple medical co-morbidities.  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 She does endorse DOE. Further she has GERD controlled with diet and meds.       Patient has been treated for HTN predates since 1999 and was dx'd with a non-ischemic cardiomyopathy and Afib and has been on Coumadin since. In 2014, a Lexican found EF 50%. In Jan 2015, she had an AAA endovascular stent graft placed. Patient's BP has been controlled at home and patient denies any cardiac symptoms as chest pain, palpitations, shortness of breath, dizziness or ankle swelling. Patient has CKD 3 attributed to her HTN.  Today's BP is 120/76.      Patient's hyperlipidemia is controlled with diet and medications. Patient denies myalgias or other medication SE's. Last lipids were at goal albeit elevated Trig's: Lab Results  Component Value Date   CHOL 124 (L) 09/19/2015   HDL 27 (L) 09/19/2015   LDLCALC 56 09/19/2015   TRIG 203 (H) 09/19/2015   CHOLHDL 4.6 09/19/2015      Patient has prediabetes predating with A1c 5.7% in 2013, then 5.6% in 2014 and  5.8% in 2016  and patient denies reactive hypoglycemic symptoms, visual blurring, diabetic polys, or paresthesias. Last A1c was  Lab Results  Component Value Date   HGBA1C 5.6 09/19/2015      Finally, patient has history of Vitamin  D Deficiency ("33" in 2008)  and last Vitamin D was better: Lab Results  Component Value Date   VD25OH 55 09/19/2015   Current Outpatient Prescriptions on File Prior to Visit  Medication Sig  . albuterol (PROVENTIL HFA;VENTOLIN HFA) 108 (90 BASE) MCG/ACT inhaler 2 puffs 5 minutes apart every 4 to 6 hours as needed to rescue asthma (Patient taking differently: Inhale 2 puffs into the lungs 5 minutes apart every 4 to 6 hours as needed to rescue asthma)  . allopurinol (ZYLOPRIM) 300 MG tablet TAKE 1 TABLET BY MOUTH EVERY DAY  . aspirin 81 MG tablet Take 81 mg by mouth daily.    Marland Kitchen azelastine (ASTELIN) 0.1 % nasal spray Place 2 sprays into both nostrils 2 (two) times daily. Use in each nostril as directed  . benazepril (LOTENSIN) 20 MG tablet Take 20 mg by mouth daily.  . bisoprolol-hydrochlorothiazide (ZIAC) 5-6.25 MG tablet Take 1 tablet by mouth daily.  . Cetirizine HCl 10 MG CAPS Take 1 capsule (10 mg total) by mouth at bedtime.  . digoxin (LANOXIN) 0.25 MG tablet TAKE 1 TABLET BY MOUTH DAILY  . furosemide (LASIX) 80 MG tablet TAKE 1 TABLET BY MOUTH TWICE A DAY AS NEEDED FOR FLUID  . gabapentin (NEURONTIN) 300 MG capsule TAKE ONE CAPSULE BY MOUTH 3 TIMES A DAY  . ipratropium (ATROVENT) 0.02 % nebulizer solution Take 0.5 mg by  nebulization every 4 (four) hours as needed for wheezing or shortness of breath.   Marland Kitchen ipratropium-albuterol (DUONEB) 0.5-2.5 (3) MG/3ML SOLN Use 3 cc  4 x day or every 4 hours if needed for shortness of breath.  . levothyroxine (SYNTHROID, LEVOTHROID) 75 MCG tablet Take 1 tablet (75 mcg total) by mouth daily.  . meclizine (ANTIVERT) 25 MG tablet Take 1 tablet (25 mg total) by mouth 3 (three) times daily as needed for dizziness.  . metolazone (ZAROXOLYN) 2.5 MG tablet Take 1 tablet (2.5 mg total) by mouth daily.  . montelukast (SINGULAIR) 10 MG tablet TAKE 1 TABLET EVERY DAY FOR ALLERGIES  . potassium chloride SA (K-DUR,KLOR-CON) 20 MEQ tablet Take 2 tablets (40 mEq total)  by mouth daily.  . pravastatin (PRAVACHOL) 40 MG tablet TAKE 1 TABLET BY MOUTH AT BEDTIME FOR CHOLESTEROL  . prochlorperazine (COMPAZINE) 5 MG tablet TAKE 1 TABLET BY MOUTH 3 TIMES A DAY FOR VERTIGO OR NAUSEA  . warfarin (COUMADIN) 2 MG tablet TAKE 1 TO 2 TABLETS BY MOUTH DAILY OR AS DIRECTED   No current facility-administered medications on file prior to visit.    Allergies  Allergen Reactions  . Advair Diskus [Fluticasone-Salmeterol] Other (See Comments)    SKIN CHANGES.  Can tolerate low dose of prednisone without complications  . Amiodarone Other (See Comments)    PULMONARY TOXICITY  . Codeine     unknown  . Diovan [Valsartan] Other (See Comments)    HYPOTENSION  . Doxycycline Diarrhea and Other (See Comments)    VISUAL DISTURBANCE  . Flexeril [Cyclobenzaprine] Other (See Comments)    FATIGUE  . Keflex [Cephalexin] Diarrhea  . Verapamil Other (See Comments)    EDEMA   Past Medical History:  Diagnosis Date  . Aortic stenosis    mild AS witm mild to mod AR by 11/2012 echo  . Arthritis   . Atrial fibrillation ([place])   . Atrial flutter ([place])   . Bronchitis, chronic ([place])   . Cervical dysplasia   . Coronary artery disease   . Cystocele    either on bladder or kidneys patient is unsure but stated physician will watch it  . Dizziness    If patient gets out of bed too fast  . Family history of anesthesia complication    Hard for daughter to wake up after Anesthesia  . Gout   . H/O hiatal hernia   . History of Bell's palsy   . Migraine headache   . Osteopenia   . Rosacea conjunctivitis    Right eye is worse than left eye  . Seasonal allergies   . Shortness of breath    with exertion  . Thyroid disease    Hypo  . Tobacco abuse   . Unspecified vitamin D deficiency    Health Maintenance  Topic Date Due  . MAMMOGRAM  09/03/2016  . DEXA SCAN  09/14/2016  . TETANUS/TDAP  03/15/2017  . COLONOSCOPY  03/15/2022  . INFLUENZA VACCINE  Completed  . ZOSTAVAX  Completed  .  PNA vac Low Risk Adult  Completed   Immunization History  Administered Date(s) Administered  . Influenza, High Dose Seasonal PF 12/20/2013, 12/09/2014, 11/14/2015  . Influenza-Unspecified 01/02/2013  . Pneumococcal Conjugate-13 01/23/2014  . Pneumococcal-Unspecified 03/15/1993, 05/31/2008  . Td 03/16/2007  . Varicella 02/19/2008   Past Surgical History:  Procedure Laterality Date  . ABDOMINAL AORTIC ENDOVASCULAR STENT GRAFT N/A 04/11/2013   Procedure: ABDOMINAL AORTIC ENDOVASCULAR STENT GRAFT WITH RIGHT FEMORAL PATCH ANGIOPLASTY;  Surgeon: Jeneen Rinks  Rockwell Alexandria, MD;  Location: [facility];  Service: Vascular;  Laterality: N/A;  . BREAST SURGERY     LEFT BREAST BIOPSY  . broken leg Left 1970s  . CARDIAC CATHETERIZATION    . CHOLECYSTECTOMY  1972  . COLONOSCOPY    . EYE SURGERY Bilateral   . Laser vein procedure    . REFRACTIVE SURGERY    . TONSILLECTOMY     Family History  Problem Relation Age of Onset  . Cirrhosis Mother   . Cancer Mother 66    PANCREAS  . Heart defect Sister   . Breast cancer Sister     age 67  . Heart disease Sister   . Stroke Sister   . Alcohol abuse Father   . Depression Father   . Hypertension Brother   . Hyperlipidemia Son   . Heart disease Daughter    Social History  Substance Use Topics  . Smoking status: Former Smoker    Types: Cigarettes    Quit date: 08/10/2002  . Smokeless tobacco: Never Used     Comment: History of tobacco abuse  . Alcohol use 0.0 oz/week     Comment: rare    ROS Constitutional: Denies fever, chills, weight loss/gain, headaches, insomnia,  night sweats, and change in appetite. Does c/o fatigue. Eyes: Denies redness, blurred vision, diplopia, discharge, itchy, watery eyes.  ENT: Denies discharge, congestion, post nasal drip, epistaxis, sore throat, earache, hearing loss, dental pain, Tinnitus, Vertigo, Sinus pain, snoring.  Cardio: Denies chest pain, palpitations, irregular heartbeat, syncope, diaphoresis, orthopnea, PND,  claudication, edema Respiratory: denies cough,  pleurisy, hoarseness, laryngitis, wheezing.  Gastrointestinal: Denies dysphagia, heartburn, reflux, water brash, pain, cramps, nausea, vomiting, bloating, diarrhea, constipation, hematemesis, melena, hematochezia, jaundice, hemorrhoids Genitourinary: Denies dysuria, frequency, urgency, nocturia, hesitancy, discharge, hematuria, flank pain Breast: Breast lumps, nipple discharge, bleeding.  Musculoskeletal: Denies arthralgia, myalgia, stiffness, Jt. Swelling, pain, limp, and strain/sprain. Denies falls. Skin: Denies puritis, rash, hives, warts, acne, eczema, changing in skin lesion Neuro: No weakness, tremor, incoordination, spasms, paresthesia, pain Psychiatric: Denies confusion, memory loss, sensory loss. Denies Depression. Endocrine: Denies change in weight, skin, hair change, nocturia, and paresthesia, diabetic polys, visual blurring, hyper / hypo glycemic episodes.  Heme/Lymph: No excessive bleeding, bruising, enlarged lymph nodes.  Physical Exam  BP 120/76   Pulse 72   Temp 97.3 F (36.3 C)   Resp 16   Ht 5\' 3"  (1.6 m)   Wt 202 lb 9.6 oz (91.9 kg)   BMI 35.89 kg/m   General Appearance: Over nourished and in no apparent distress.  Eyes: PERRLA, EOMs, conjunctiva no swelling or erythema, normal fundi and vessels. Sinuses: No frontal/maxillary tenderness ENT/Mouth: EACs patent / TMs  nl. Nares clear without erythema, swelling, mucoid exudates. Oral hygiene is good. No erythema, swelling, or exudate. Tongue normal, non-obstructing. Tonsils not swollen or erythematous. Hearing normal.  Neck: Supple, thyroid normal. No bruits, nodes or JVD. Respiratory: Respiratory effort normal.  BS equal and clear bilateral without rales, rhonci, wheezing or stridor. Cardio: Heart sounds are normal with regular rate and rhythm and no murmurs, rubs or gallops. Peripheral pulses are normal and equal bilaterally without edema. No aortic or femoral  bruits. Chest: symmetric with normal excursions and percussion. Breasts: Deferred by patient. Abdomen: Flat, soft with bowel sounds active. Nontender, no guarding, rebound, hernias, masses, or organomegaly.  Lymphatics: Non tender without lymphadenopathy.  Musculoskeletal: Full ROM all peripheral extremities, joint stability, 5/5 strength, and normal gait. Skin: Warm and dry without rashes, lesions, cyanosis,  clubbing or  ecchymosis.  Neuro: Cranial nerves intact, reflexes equal bilaterally. Normal muscle tone, no cerebellar symptoms. Sensation intact.  Pysch: Alert and oriented X 3, normal affect, Insight and Judgment appropriate.   Assessment and Plan  1. Annual Preventative Screening Examination  1. Encounter for general adult medical examination with abnormal findings  - Microalbumin / creatinine urine ratio - EKG 12-Lead - POC Hemoccult Bld/Stl - Urinalysis, Routine w reflex microscopic - Protime-INR - CBC with Differential/Platelet - Hepatic function panel - Magnesium - BASIC METABOLIC PANEL WITH GFR - Lipid panel - TSH - Hemoglobin A1c - Insulin, random - VITAMIN D 25 Hydroxy  2. Essential hypertension  - Microalbumin / creatinine urine ratio - EKG 12-Lead - TSH  3. Hyperlipidemia  - Lipid panel - TSH  4. Other abnormal glucose  - Hemoglobin A1c  5. Vitamin D deficiency   6. Chronic atrial fibrillation (HCC)  - Digoxin level  7. Hypothyroidism   8. Gastroesophageal reflux disease   9. Chronic obstructive pulmonary disease,   10. Screening for ischemic heart disease   11. Colon cancer screening  - POC Hemoccult Bld/Stl   12. Long term current use of anticoagulant therapy  - Protime-INR  13. Medication management  - Urinalysis, Routine w reflex microscopic - CBC with Differential/Platelet - Hepatic function panel - Magnesium - BASIC METABOLIC PANEL WITH GFR - Digoxin level.     Continue prudent diet as discussed, weight control, BP  monitoring, regular exercise, and medications. Discussed med's effects and SE's. Screening labs and tests as requested with regular follow-up as recommended. Over 40 minutes of exam, counseling, chart review and high complex critical decision making was performed.

## 2015-12-09 NOTE — Patient Instructions (Signed)

## 2015-12-10 LAB — HEMOGLOBIN A1C
Hgb A1c MFr Bld: 5.6 % (ref <5.7)
Mean Plasma Glucose: 114 mg/dL

## 2015-12-10 LAB — INSULIN, RANDOM: INSULIN: 72.3 u[IU]/mL — AB (ref 2.0–19.6)

## 2015-12-10 LAB — URINALYSIS, MICROSCOPIC ONLY
CASTS: NONE SEEN [LPF]
CRYSTALS: NONE SEEN [HPF]
WBC, UA: >60 WBC/HPF — AB (ref <=5)
YEAST: NONE SEEN [HPF]

## 2015-12-10 LAB — URINALYSIS, ROUTINE W REFLEX MICROSCOPIC
Bilirubin Urine: NEGATIVE
Glucose, UA: NEGATIVE
Hgb urine dipstick: NEGATIVE
Ketones, ur: NEGATIVE
NITRITE: NEGATIVE
PROTEIN: NEGATIVE
SPECIFIC GRAVITY, URINE: 1.017 (ref 1.001–1.035)
pH: 5.5 (ref 5.0–8.0)

## 2015-12-10 LAB — MICROALBUMIN / CREATININE URINE RATIO
CREATININE, URINE: 178 mg/dL (ref 20–320)
MICROALB UR: 0.8 mg/dL
Microalb Creat Ratio: 4 mcg/mg creat (ref <30)

## 2015-12-10 LAB — DIGOXIN LEVEL: Digoxin Level: 1 ug/L (ref 0.8–2.0)

## 2015-12-10 LAB — VITAMIN D 25 HYDROXY (VIT D DEFICIENCY, FRACTURES): VIT D 25 HYDROXY: 73 ng/mL (ref 30–100)

## 2015-12-23 ENCOUNTER — Observation Stay (HOSPITAL_COMMUNITY): Payer: Medicare Other

## 2015-12-23 ENCOUNTER — Encounter (HOSPITAL_COMMUNITY): Payer: Self-pay | Admitting: Emergency Medicine

## 2015-12-23 ENCOUNTER — Emergency Department (HOSPITAL_COMMUNITY): Payer: Medicare Other

## 2015-12-23 ENCOUNTER — Inpatient Hospital Stay (HOSPITAL_COMMUNITY)
Admission: EM | Admit: 2015-12-23 | Discharge: 2015-12-30 | DRG: 871 | Disposition: A | Discharge status: Home / Self Care | Payer: Medicare Other | Attending: Internal Medicine | Admitting: Internal Medicine

## 2015-12-23 DIAGNOSIS — J189 Pneumonia, unspecified organism: Secondary | ICD-10-CM | POA: Diagnosis present

## 2015-12-23 DIAGNOSIS — K219 Gastro-esophageal reflux disease without esophagitis: Secondary | ICD-10-CM | POA: Diagnosis present

## 2015-12-23 DIAGNOSIS — N183 Chronic kidney disease, stage 3 unspecified: Secondary | ICD-10-CM | POA: Diagnosis present

## 2015-12-23 DIAGNOSIS — Z888 Allergy status to other drugs, medicaments and biological substances status: Secondary | ICD-10-CM | POA: Diagnosis not present

## 2015-12-23 DIAGNOSIS — R042 Hemoptysis: Secondary | ICD-10-CM | POA: Diagnosis present

## 2015-12-23 DIAGNOSIS — I5043 Acute on chronic combined systolic (congestive) and diastolic (congestive) heart failure: Secondary | ICD-10-CM | POA: Diagnosis not present

## 2015-12-23 DIAGNOSIS — Z811 Family history of alcohol abuse and dependence: Secondary | ICD-10-CM

## 2015-12-23 DIAGNOSIS — J449 Chronic obstructive pulmonary disease, unspecified: Secondary | ICD-10-CM | POA: Diagnosis not present

## 2015-12-23 DIAGNOSIS — J440 Chronic obstructive pulmonary disease with acute lower respiratory infection: Secondary | ICD-10-CM | POA: Diagnosis present

## 2015-12-23 DIAGNOSIS — E039 Hypothyroidism, unspecified: Secondary | ICD-10-CM | POA: Diagnosis not present

## 2015-12-23 DIAGNOSIS — I10 Essential (primary) hypertension: Secondary | ICD-10-CM | POA: Diagnosis present

## 2015-12-23 DIAGNOSIS — I429 Cardiomyopathy, unspecified: Secondary | ICD-10-CM | POA: Diagnosis present

## 2015-12-23 DIAGNOSIS — J441 Chronic obstructive pulmonary disease with (acute) exacerbation: Secondary | ICD-10-CM | POA: Diagnosis present

## 2015-12-23 DIAGNOSIS — R079 Chest pain, unspecified: Secondary | ICD-10-CM | POA: Diagnosis not present

## 2015-12-23 DIAGNOSIS — J9601 Acute respiratory failure with hypoxia: Secondary | ICD-10-CM | POA: Diagnosis not present

## 2015-12-23 DIAGNOSIS — I2510 Atherosclerotic heart disease of native coronary artery without angina pectoris: Secondary | ICD-10-CM | POA: Diagnosis present

## 2015-12-23 DIAGNOSIS — R0602 Shortness of breath: Secondary | ICD-10-CM

## 2015-12-23 DIAGNOSIS — I350 Nonrheumatic aortic (valve) stenosis: Secondary | ICD-10-CM | POA: Diagnosis present

## 2015-12-23 DIAGNOSIS — Z9049 Acquired absence of other specified parts of digestive tract: Secondary | ICD-10-CM

## 2015-12-23 DIAGNOSIS — Z8249 Family history of ischemic heart disease and other diseases of the circulatory system: Secondary | ICD-10-CM

## 2015-12-23 DIAGNOSIS — Z7982 Long term (current) use of aspirin: Secondary | ICD-10-CM

## 2015-12-23 DIAGNOSIS — I130 Hypertensive heart and chronic kidney disease with heart failure and stage 1 through stage 4 chronic kidney disease, or unspecified chronic kidney disease: Secondary | ICD-10-CM | POA: Diagnosis present

## 2015-12-23 DIAGNOSIS — I5031 Acute diastolic (congestive) heart failure: Secondary | ICD-10-CM | POA: Diagnosis not present

## 2015-12-23 DIAGNOSIS — J14 Pneumonia due to Hemophilus influenzae: Secondary | ICD-10-CM | POA: Diagnosis not present

## 2015-12-23 DIAGNOSIS — I482 Chronic atrial fibrillation: Secondary | ICD-10-CM | POA: Diagnosis not present

## 2015-12-23 DIAGNOSIS — E785 Hyperlipidemia, unspecified: Secondary | ICD-10-CM | POA: Diagnosis not present

## 2015-12-23 DIAGNOSIS — I5020 Unspecified systolic (congestive) heart failure: Secondary | ICD-10-CM | POA: Diagnosis not present

## 2015-12-23 DIAGNOSIS — R0902 Hypoxemia: Secondary | ICD-10-CM

## 2015-12-23 DIAGNOSIS — Z800 Family history of malignant neoplasm of digestive organs: Secondary | ICD-10-CM

## 2015-12-23 DIAGNOSIS — Z7901 Long term (current) use of anticoagulants: Secondary | ICD-10-CM | POA: Diagnosis not present

## 2015-12-23 DIAGNOSIS — Z818 Family history of other mental and behavioral disorders: Secondary | ICD-10-CM

## 2015-12-23 DIAGNOSIS — Z87410 Personal history of cervical dysplasia: Secondary | ICD-10-CM

## 2015-12-23 DIAGNOSIS — Z823 Family history of stroke: Secondary | ICD-10-CM

## 2015-12-23 DIAGNOSIS — Z79899 Other long term (current) drug therapy: Secondary | ICD-10-CM

## 2015-12-23 DIAGNOSIS — Z6835 Body mass index (BMI) 35.0-35.9, adult: Secondary | ICD-10-CM

## 2015-12-23 DIAGNOSIS — I4891 Unspecified atrial fibrillation: Secondary | ICD-10-CM

## 2015-12-23 DIAGNOSIS — Z87891 Personal history of nicotine dependence: Secondary | ICD-10-CM

## 2015-12-23 DIAGNOSIS — M8580 Other specified disorders of bone density and structure, unspecified site: Secondary | ICD-10-CM | POA: Diagnosis present

## 2015-12-23 DIAGNOSIS — G510 Bell's palsy: Secondary | ICD-10-CM | POA: Diagnosis not present

## 2015-12-23 DIAGNOSIS — A419 Sepsis, unspecified organism: Principal | ICD-10-CM | POA: Diagnosis present

## 2015-12-23 DIAGNOSIS — Z803 Family history of malignant neoplasm of breast: Secondary | ICD-10-CM | POA: Diagnosis not present

## 2015-12-23 DIAGNOSIS — R0989 Other specified symptoms and signs involving the circulatory and respiratory systems: Secondary | ICD-10-CM

## 2015-12-23 DIAGNOSIS — E669 Obesity, unspecified: Secondary | ICD-10-CM | POA: Diagnosis present

## 2015-12-23 DIAGNOSIS — Z8679 Personal history of other diseases of the circulatory system: Secondary | ICD-10-CM

## 2015-12-23 DIAGNOSIS — N179 Acute kidney failure, unspecified: Secondary | ICD-10-CM | POA: Diagnosis not present

## 2015-12-23 DIAGNOSIS — Z9889 Other specified postprocedural states: Secondary | ICD-10-CM

## 2015-12-23 DIAGNOSIS — R000 Tachycardia, unspecified: Secondary | ICD-10-CM | POA: Diagnosis not present

## 2015-12-23 DIAGNOSIS — I5042 Chronic combined systolic (congestive) and diastolic (congestive) heart failure: Secondary | ICD-10-CM

## 2015-12-23 DIAGNOSIS — A413 Sepsis due to Hemophilus influenzae: Secondary | ICD-10-CM | POA: Diagnosis not present

## 2015-12-23 DIAGNOSIS — R0600 Dyspnea, unspecified: Secondary | ICD-10-CM

## 2015-12-23 DIAGNOSIS — E782 Mixed hyperlipidemia: Secondary | ICD-10-CM | POA: Diagnosis present

## 2015-12-23 DIAGNOSIS — M109 Gout, unspecified: Secondary | ICD-10-CM | POA: Diagnosis present

## 2015-12-23 DIAGNOSIS — D72829 Elevated white blood cell count, unspecified: Secondary | ICD-10-CM

## 2015-12-23 LAB — BASIC METABOLIC PANEL
Anion gap: 11 (ref 5–15)
BUN: 20 mg/dL (ref 6–20)
CALCIUM: 9.8 mg/dL (ref 8.9–10.3)
CO2: 25 mmol/L (ref 22–32)
CREATININE: 1.44 mg/dL — AB (ref 0.44–1.00)
Chloride: 100 mmol/L — ABNORMAL LOW (ref 101–111)
GFR calc Af Amer: 41 mL/min — ABNORMAL LOW (ref >60)
GFR calc non Af Amer: 35 mL/min — ABNORMAL LOW (ref >60)
GLUCOSE: 119 mg/dL — AB (ref 65–99)
Potassium: 4.8 mmol/L (ref 3.5–5.1)
Sodium: 136 mmol/L (ref 135–145)

## 2015-12-23 LAB — CBC
HCT: 46.4 % — ABNORMAL HIGH (ref 36.0–46.0)
HEMOGLOBIN: 15.8 g/dL — AB (ref 12.0–15.0)
MCH: 32.1 pg (ref 26.0–34.0)
MCHC: 34.1 g/dL (ref 30.0–36.0)
MCV: 94.3 fL (ref 78.0–100.0)
PLATELETS: 175 10*3/uL (ref 150–400)
RBC: 4.92 MIL/uL (ref 3.87–5.11)
RDW: 14.7 % (ref 11.5–15.5)
WBC: 15.6 10*3/uL — ABNORMAL HIGH (ref 4.0–10.5)

## 2015-12-23 LAB — I-STAT TROPONIN, ED
TROPONIN I, POC: 0.01 ng/mL (ref 0.00–0.08)
Troponin i, poc: 0.03 ng/mL (ref 0.00–0.08)

## 2015-12-23 LAB — BRAIN NATRIURETIC PEPTIDE: B Natriuretic Peptide: 282.2 pg/mL — ABNORMAL HIGH (ref 0.0–100.0)

## 2015-12-23 LAB — DIGOXIN LEVEL: Digoxin Level: 0.8 ng/mL (ref 0.8–2.0)

## 2015-12-23 LAB — MRSA PCR SCREENING: MRSA by PCR: NEGATIVE

## 2015-12-23 LAB — PROTIME-INR
INR: 2.54
Prothrombin Time: 27.8 seconds — ABNORMAL HIGH (ref 11.4–15.2)

## 2015-12-23 MED ORDER — GABAPENTIN 300 MG PO CAPS
300.0000 mg | ORAL_CAPSULE | Freq: Three times a day (TID) | ORAL | Status: DC
  Administered 2015-12-23 – 2015-12-30 (×20): 300 mg via ORAL
  Filled 2015-12-23 (×2): qty 1
  Filled 2015-12-23: qty 3
  Filled 2015-12-23 (×12): qty 1
  Filled 2015-12-23: qty 3
  Filled 2015-12-23 (×4): qty 1

## 2015-12-23 MED ORDER — SODIUM CHLORIDE 0.9 % IV BOLUS (SEPSIS)
500.0000 mL | Freq: Once | INTRAVENOUS | Status: AC
Start: 2015-12-23 — End: 2015-12-23
  Administered 2015-12-23: 500 mL via INTRAVENOUS

## 2015-12-23 MED ORDER — DIGOXIN 125 MCG PO TABS
250.0000 ug | ORAL_TABLET | Freq: Every day | ORAL | Status: DC
  Administered 2015-12-24 – 2015-12-30 (×7): 250 ug via ORAL
  Filled 2015-12-23 (×2): qty 2
  Filled 2015-12-23: qty 1
  Filled 2015-12-23: qty 2
  Filled 2015-12-23 (×2): qty 1
  Filled 2015-12-23: qty 2

## 2015-12-23 MED ORDER — PRAVASTATIN SODIUM 40 MG PO TABS
40.0000 mg | ORAL_TABLET | Freq: Every day | ORAL | Status: DC
  Administered 2015-12-24 – 2015-12-29 (×6): 40 mg via ORAL
  Filled 2015-12-23 (×6): qty 1

## 2015-12-23 MED ORDER — TECHNETIUM TC 99M DIETHYLENETRIAME-PENTAACETIC ACID: 30.0000 | Freq: Once | INTRAVENOUS | Status: DC | PRN

## 2015-12-23 MED ORDER — DILTIAZEM LOAD VIA INFUSION
15.0000 mg | Freq: Once | INTRAVENOUS | Status: AC
  Administered 2015-12-23: 15 mg via INTRAVENOUS
  Filled 2015-12-23: qty 15

## 2015-12-23 MED ORDER — FLUCONAZOLE 100 MG PO TABS
150.0000 mg | ORAL_TABLET | ORAL | Status: DC
  Administered 2015-12-24: 150 mg via ORAL
  Filled 2015-12-23: qty 1

## 2015-12-23 MED ORDER — ASPIRIN EC 81 MG PO TBEC
81.0000 mg | DELAYED_RELEASE_TABLET | Freq: Every day | ORAL | Status: DC
  Administered 2015-12-24 – 2015-12-30 (×7): 81 mg via ORAL
  Filled 2015-12-23 (×7): qty 1

## 2015-12-23 MED ORDER — TECHNETIUM TO 99M ALBUMIN AGGREGATED
3.0000 | Freq: Once | INTRAVENOUS | Status: AC | PRN
  Administered 2015-12-23: 3 via INTRAVENOUS

## 2015-12-23 MED ORDER — ZOLPIDEM TARTRATE 5 MG PO TABS
5.0000 mg | ORAL_TABLET | Freq: Every evening | ORAL | Status: DC | PRN
  Administered 2015-12-23: 2.5 mg via ORAL
  Filled 2015-12-23: qty 1

## 2015-12-23 MED ORDER — IPRATROPIUM BROMIDE 0.02 % IN SOLN
0.5000 mg | RESPIRATORY_TRACT | Status: DC | PRN
  Administered 2015-12-28: 0.5 mg via RESPIRATORY_TRACT
  Filled 2015-12-23: qty 2.5

## 2015-12-23 MED ORDER — ERYTHROMYCIN 5 MG/GM OP OINT
1.0000 "application " | TOPICAL_OINTMENT | Freq: Every day | OPHTHALMIC | Status: DC
  Administered 2015-12-23 – 2015-12-26 (×4): 1 via OPHTHALMIC
  Filled 2015-12-23 (×2): qty 3.5

## 2015-12-23 MED ORDER — AZITHROMYCIN 1 % OP SOLN: 1.0000 [drp] | Freq: Every day | OPHTHALMIC | Status: DC

## 2015-12-23 MED ORDER — ACETAMINOPHEN 325 MG PO TABS
650.0000 mg | ORAL_TABLET | ORAL | Status: DC | PRN
  Administered 2015-12-23 – 2015-12-29 (×9): 650 mg via ORAL
  Filled 2015-12-23 (×10): qty 2

## 2015-12-23 MED ORDER — ONDANSETRON HCL 4 MG/2ML IJ SOLN: 4.0000 mg | Freq: Four times a day (QID) | INTRAMUSCULAR | Status: DC | PRN

## 2015-12-23 MED ORDER — LEVOTHYROXINE SODIUM 50 MCG PO TABS
50.0000 ug | ORAL_TABLET | Freq: Every day | ORAL | Status: DC
  Administered 2015-12-24 – 2015-12-30 (×7): 50 ug via ORAL
  Filled 2015-12-23 (×8): qty 1

## 2015-12-23 MED ORDER — CYCLOSPORINE 0.05 % OP EMUL
1.0000 [drp] | Freq: Two times a day (BID) | OPHTHALMIC | Status: DC
  Administered 2015-12-24 – 2015-12-30 (×7): 1 [drp] via OPHTHALMIC
  Filled 2015-12-23 (×15): qty 1

## 2015-12-23 MED ORDER — ALLOPURINOL 300 MG PO TABS
300.0000 mg | ORAL_TABLET | Freq: Every day | ORAL | Status: DC
  Administered 2015-12-24 – 2015-12-30 (×7): 300 mg via ORAL
  Filled 2015-12-23 (×7): qty 1

## 2015-12-23 MED ORDER — PREDNISOLONE ACETATE 1 % OP SUSP
1.0000 [drp] | Freq: Three times a day (TID) | OPHTHALMIC | Status: DC
  Administered 2015-12-23 – 2015-12-30 (×20): 1 [drp] via OPHTHALMIC
  Filled 2015-12-23: qty 1

## 2015-12-23 MED ORDER — DILTIAZEM HCL-DEXTROSE 100-5 MG/100ML-% IV SOLN (PREMIX)
5.0000 mg/h | INTRAVENOUS | Status: DC
  Administered 2015-12-23: 5 mg/h via INTRAVENOUS
  Administered 2015-12-24: 15 mg/h via INTRAVENOUS
  Filled 2015-12-23 (×2): qty 100

## 2015-12-23 MED ORDER — AZELASTINE HCL 0.1 % NA SOLN
2.0000 | Freq: Two times a day (BID) | NASAL | Status: DC
  Administered 2015-12-23 – 2015-12-30 (×13): 2 via NASAL
  Filled 2015-12-23: qty 30

## 2015-12-23 MED ORDER — FUROSEMIDE 10 MG/ML IJ SOLN
40.0000 mg | Freq: Once | INTRAMUSCULAR | Status: AC
  Administered 2015-12-23: 40 mg via INTRAVENOUS
  Filled 2015-12-23: qty 4

## 2015-12-23 MED ORDER — MECLIZINE HCL 25 MG PO TABS: 25.0000 mg | ORAL_TABLET | Freq: Three times a day (TID) | ORAL | Status: DC | PRN

## 2015-12-23 NOTE — ED Notes (Signed)
Pt. transported to VQ scanner.

## 2015-12-23 NOTE — ED Notes (Signed)
Pt removed cardiac leads, bp cuff, and pulse ox when RN entered room. Pt requesting to leave. Discussed how pt needs EDP to see pt and that pt requires medical interventions. Pt verbalized understanding. Dr. Jimmye Norman, resident, at bedside.

## 2015-12-23 NOTE — ED Provider Notes (Signed)
[facility] DEPT Provider Note   CSN: [id number] Arrival date & time: 12/23/15  1318     History   Chief Complaint Chief Complaint  Patient presents with  . Shortness of Breath  . Tachycardia    HPI Katie [name] is a 75 y.o. female.  The history is provided by the patient.  Shortness of Breath  This is a new problem. The average episode lasts 5 hours. The problem occurs continuously.The current episode started 3 to 5 hours ago. The problem has been gradually worsening. Associated symptoms include orthopnea. Pertinent negatives include no fever, no headaches, no cough, no chest pain, no vomiting, no abdominal pain, no rash, no leg pain and no leg swelling. It is unknown what precipitated the problem. She has tried nothing for the symptoms. Associated medical issues include CAD.    Past Medical History:  Diagnosis Date  . Aortic stenosis    mild AS witm mild to mod AR by 11/2012 echo  . Arthritis   . Atrial fibrillation ([place])   . Atrial flutter ([place])   . Bronchitis, chronic ([place])   . Cervical dysplasia   . Coronary artery disease   . Cystocele    either on bladder or kidneys patient is unsure but stated physician will watch it  . Dizziness    If patient gets out of bed too fast  . Family history of anesthesia complication    Hard for daughter to wake up after Anesthesia  . Gout   . H/O hiatal hernia   . History of Bell's palsy   . Migraine headache   . Osteopenia   . Rosacea conjunctivitis    Right eye is worse than left eye  . Seasonal allergies   . Shortness of breath    with exertion  . Thyroid disease    Hypo  . Tobacco abuse   . Unspecified vitamin D deficiency     Patient Active Problem List   Diagnosis Date Noted  . Atrial fibrillation with rapid ventricular response ([place]) 12/23/2015  . Other abnormal glucose 09/19/2015  . AAA (abdominal aortic aneurysm) without rupture ([place]) 04/22/2015  . Medicare annual wellness visit, initial 11/13/2014  .  Morbid obesity (BMI 34+)  08/03/2014  . PAD (peripheral artery disease) ([place]) 04/16/2014  . PVD (peripheral vascular disease) with claudication ([place]) 10/16/2013  . CKD  stage III (GFR 51 ml/min) 06/08/2013  . Hyperlipidemia 06/08/2013  . Medication management 06/08/2013  . 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 Long term current use of anticoagulant therapy 04/23/2013  . AAA (abdominal aortic aneurysm) ([place]) 04/11/2013  . Vitamin D deficiency 02/15/2013  . Cervical dysplasia   . Hypothyroidism   . Osteopenia   . Congestive heart failure ([place]) 11/25/2008  . Migraine headache 11/22/2008  . Essential hypertension 11/22/2008  . Coronary atherosclerosis 11/22/2008  . Atrial fibrillation ([place]) 11/22/2008  . COPD (chronic obstructive pulmonary disease) with chronic bronchitis ([place]) 11/22/2008  . GERD 11/22/2008    Past Surgical History:  Procedure Laterality Date  . ABDOMINAL AORTIC ENDOVASCULAR STENT GRAFT N/A 04/11/2013   Procedure: ABDOMINAL AORTIC ENDOVASCULAR STENT GRAFT WITH RIGHT FEMORAL PATCH ANGIOPLASTY;  Surgeon: Mal Misty, MD;  Location: [facility];  Service: Vascular;  Laterality: N/A;  . BREAST SURGERY     LEFT BREAST BIOPSY  . broken leg Left 1970s  . CARDIAC CATHETERIZATION    . CHOLECYSTECTOMY  1972  . COLONOSCOPY    . EYE SURGERY Bilateral   . Laser vein procedure    .  REFRACTIVE SURGERY    . TONSILLECTOMY      OB History    Gravida Para Term Preterm AB Living   4 3 3   1 3    SAB TAB Ectopic Multiple Live Births                   Home Medications    Prior to Admission medications   Medication Sig Start Date End Date Taking? Authorizing Provider  allopurinol (ZYLOPRIM) 300 MG tablet TAKE 1 TABLET BY MOUTH EVERY DAY 11/27/15  Yes Vicie Mutters, PA-C  aspirin 81 MG tablet Take 81 mg by mouth daily.     Yes Historical Provider, MD  bisoprolol-hydrochlorothiazide (ZIAC) 5-6.25 MG tablet Take 1 tablet by mouth daily. 02/12/15  Yes Vicie Mutters, PA-C  Cetirizine HCl 10 MG CAPS Take 1 capsule (10 mg total) by mouth at bedtime. 07/03/15  Yes Courtney Forcucci, PA-C  digoxin (LANOXIN) 0.25 MG tablet TAKE 1 TABLET BY MOUTH DAILY 09/09/14  Yes [name], MD  furosemide (LASIX) 80 MG tablet TAKE 1 TABLET BY MOUTH TWICE A DAY AS NEEDED FOR FLUID Patient taking differently: Take 40 mg by mouth daily. TAKE 1 TABLET BY MOUTH TWICE A DAY AS NEEDED FOR FLUID 05/06/15  Yes Courtney Forcucci, PA-C  montelukast (SINGULAIR) 10 MG tablet TAKE 1 TABLET EVERY DAY FOR ALLERGIES 08/19/15  Yes [name], MD  potassium chloride SA (K-DUR,KLOR-CON) 20 MEQ tablet Take 2 tablets (40 mEq total) by mouth daily. 05/06/15  Yes Courtney Forcucci, PA-C  pravastatin (PRAVACHOL) 40 MG tablet TAKE 1 TABLET BY MOUTH AT BEDTIME FOR CHOLESTEROL 07/08/15  Yes [name], MD  prochlorperazine (COMPAZINE) 5 MG tablet TAKE 1 TABLET BY MOUTH 3 TIMES A DAY FOR VERTIGO OR NAUSEA 09/16/15  Yes [name], MD  albuterol (PROVENTIL HFA;VENTOLIN HFA) 108 (90 BASE) MCG/ACT inhaler 2 puffs 5 minutes apart every 4 to 6 hours as needed to rescue asthma Patient taking differently: Inhale 2 puffs into the lungs 5 minutes apart every 4 to 6 hours as needed to rescue asthma 08/13/13 11/14/15  Kelby Aline, PA-C  azelastine (ASTELIN) 0.1 % nasal spray Place 2 sprays into both nostrils 2 (two) times daily. Use in each nostril as directed 09/04/15   Vicie Mutters, PA-C  benazepril (LOTENSIN) 20 MG tablet Take 20 mg by mouth daily.    Historical Provider, MD  gabapentin (NEURONTIN) 300 MG capsule TAKE ONE CAPSULE BY MOUTH 3 TIMES A DAY 11/14/15   Vicie Mutters, PA-C  ipratropium (ATROVENT) 0.02 % nebulizer solution Take 0.5 mg by nebulization every 4 (four) hours as needed for wheezing or shortness of breath.  12/25/13   Historical Provider, MD  ipratropium-albuterol (DUONEB) 0.5-2.5 (3) MG/3ML SOLN Use 3 cc  4 x day or every 4 hours if needed for shortness of breath. 12/25/13    [name], MD  levothyroxine (SYNTHROID, LEVOTHROID) 75 MCG tablet Take 1 tablet (75 mcg total) by mouth daily. 02/12/15   Vicie Mutters, PA-C  meclizine (ANTIVERT) 25 MG tablet Take 1 tablet (25 mg total) by mouth 3 (three) times daily as needed for dizziness. 04/18/15   Courtney Forcucci, PA-C  metolazone (ZAROXOLYN) 2.5 MG tablet Take 1 tablet (2.5 mg total) by mouth daily. 05/06/15 05/05/16  Courtney Forcucci, PA-C  warfarin (COUMADIN) 2 MG tablet TAKE 1 TO 2 TABLETS BY MOUTH DAILY OR AS DIRECTED 03/05/15   [name], MD    Family History Family History  Problem Relation Age of Onset  .  Cirrhosis Mother   . Cancer Mother 49    PANCREAS  . Heart defect Sister   . Breast cancer Sister     age 58  . Heart disease Sister   . Stroke Sister   . Alcohol abuse Father   . Depression Father   . Hypertension Brother   . Hyperlipidemia Son   . Heart disease Daughter     Social History Social History  Substance Use Topics  . Smoking status: Former Smoker    Types: Cigarettes    Quit date: 08/10/2002  . Smokeless tobacco: Never Used     Comment: History of tobacco abuse  . Alcohol use 0.0 oz/week     Comment: rare     Allergies   Advair diskus [fluticasone-salmeterol]; Amiodarone; Codeine; Diovan [valsartan]; Doxycycline; Flexeril [cyclobenzaprine]; Keflex [cephalexin]; and Verapamil   Review of Systems Review of Systems  Constitutional: Positive for diaphoresis and fatigue. Negative for chills and fever.  HENT: Negative for congestion.   Respiratory: Positive for shortness of breath. Negative for cough.   Cardiovascular: Positive for orthopnea. Negative for chest pain and leg swelling.  Gastrointestinal: Negative for abdominal pain and vomiting.  Genitourinary: Negative for flank pain.  Musculoskeletal: Negative for myalgias.  Skin: Negative for rash.  Neurological: Negative for headaches.  Psychiatric/Behavioral: Negative for confusion.     Physical  Exam Updated Vital Signs BP 141/74   Pulse 116   Temp 98.3 F (36.8 C) (Oral)   Resp (!) 27   SpO2 91%   Physical Exam  Constitutional: She is oriented to person, place, and time. She appears well-developed and well-nourished. No distress.  Cooperative but anxious, diaphoretic   HENT:  Head: Normocephalic and atraumatic.  Eyes: Conjunctivae are normal. No scleral icterus.  Neck: Normal range of motion. Neck supple.  Cardiovascular: Intact distal pulses.   Murmur heard. Irregularly irregular rhythm to 150's  Pulmonary/Chest: Effort normal and breath sounds normal. No respiratory distress.  Abdominal: Soft. She exhibits no distension. There is no tenderness.  Musculoskeletal: She exhibits no edema or tenderness.  Symmetric size and appearance of b/l Le's, no calf swelling or tenderness  Neurological: She is alert and oriented to person, place, and time. She exhibits normal muscle tone. Coordination normal.  Skin: Skin is warm. Capillary refill takes less than 2 seconds. She is diaphoretic. No pallor.  Psychiatric: Her mood appears anxious.  Nursing note and vitals reviewed.    ED Treatments / Results  Labs (all labs ordered are listed, but only abnormal results are displayed) Labs Reviewed  BASIC METABOLIC PANEL - Abnormal; Notable for the following:       Result Value   Chloride 100 (*)    Glucose, Bld 119 (*)    Creatinine, Ser 1.44 (*)    GFR calc non Af Amer 35 (*)    GFR calc Af Amer 41 (*)    All other components within normal limits  CBC - Abnormal; Notable for the following:    WBC 15.6 (*)    Hemoglobin 15.8 (*)    HCT 46.4 (*)    All other components within normal limits  BRAIN NATRIURETIC PEPTIDE - Abnormal; Notable for the following:    B Natriuretic Peptide 282.2 (*)    All other components within normal limits  PROTIME-INR - Abnormal; Notable for the following:    Prothrombin Time 27.8 (*)    All other components within normal limits  DIGOXIN LEVEL   I-STAT TROPOININ, ED  Randolm Idol, ED  EKG  EKG Interpretation  Date/Time:  Tuesday December 23 2015 13:24:36 EDT Ventricular Rate:  133 PR Interval:    QRS Duration: 122 QT Interval:  322 QTC Calculation: 479 R Axis:   -124 Text Interpretation:  Atrial fibrillation with rapid ventricular response Right superior axis deviation Non-specific intra-ventricular conduction delay Marked ST abnormality, possible lateral subendocardial injury Abnormal ECG st changes likely rate related Otherwise no significant change Confirmed by Tyrone Nine MD, DANIEL [phone]) on 12/23/2015 3:26:40 PM       Radiology Dg Chest Portable 1 View  Result Date: 12/23/2015 CLINICAL DATA:  Arrhythmia. Spitting up blood. Atrial fibrillation. EXAM: PORTABLE CHEST 1 VIEW COMPARISON:  03/18/2014 FINDINGS: Cardiac silhouette is enlarged. Atherosclerotic calcifications at the aortic arch. Lungs are essentially clear without pulmonary edema. No large pleural effusions. No acute bone abnormality. IMPRESSION: Cardiomegaly without focal airspace disease or pulmonary edema. Aortic atherosclerosis. Electronically Signed   By: Markus Daft M.D.   On: 12/23/2015 14:46    Procedures Procedures (including critical care time)  Medications Ordered in ED Medications  diltiazem (CARDIZEM) 1 mg/mL load via infusion 15 mg (15 mg Intravenous Bolus from Bag 12/23/15 1539)    And  diltiazem (CARDIZEM) 100 mg in dextrose 5% 1102mL (1 mg/mL) infusion (5 mg/hr Intravenous New Bag/Given 12/23/15 1538)  sodium chloride 0.9 % bolus 500 mL (500 mLs Intravenous New Bag/Given 12/23/15 1534)     Initial Impression / Assessment and Plan / ED Course  I have reviewed the triage vital signs and the nursing notes.  Pertinent labs & imaging results that were available during my care of the patient were reviewed by me and considered in my medical decision making (see chart for details).  Clinical Course   Katie [name] is a 75 y.o. female in  chronic atrial fibrillation who presents to ED for assessment of acute-onset dyspnea around 1000 today, found to be in a-fib with RVR on arrival. Pt states she believes this was the cause of her dyspnea, as she has presented like this before when going into a-fib with RVR. Denies chest pain or discomfort. Doubt ACS as etiology. Therapeutic on coumadin with INR 2.5 as above, therefore doubt PE as etiology of RVR.  Diaphoretic and dyspneic to 27 on initial evaluation, normal Bp. Given cardizem bolus, resolution of diaphoresis, normal BP and HR drop to 95-105 on cardizem drip. Admitted to hospitalist for further management with cardiology to follow, pt agrees with this plan.  Pt condition, course, and admission were discussed with attending physician Dr. Deno Etienne.  Final Clinical Impressions(s) / ED Diagnoses   Final diagnoses:  Atrial fibrillation with RVR [facility])    New Prescriptions New Prescriptions   No medications on file       Paralee Cancel, MD 12/23/15 [place], DO 12/23/15 [place], DO 12/23/15 1742

## 2015-12-23 NOTE — ED Notes (Signed)
Assisted pt to the BR.

## 2015-12-23 NOTE — H&P (Signed)
TRH H&P   Patient Demographics:    Katie [name], is a 75 y.o. female  MRN: [id number]   DOB - 11/23/1940  Admit Date - 12/23/2015  Outpatient Primary MD for the patient is [name], MD  Referring MD/NP/PA: Dr Jimmye Norman  Outpatient Specialists: Cards dr Lovena Le  Patient coming from: Home  Chief Complaint  Patient presents with  . Shortness of Breath  . Tachycardia      HPI:    Katie [name]  is a 75 y.o. female, with past medical history of COPD, hypertension, atrial fibrillation on  Warfarin,nonischemic cardiomyopathy, nonobstructive coronary artery disease patient presents with complaints of palpitation and shortness of breath this a.m.reports  Has been having progressive dyspnea over few month, but then significantly worsened over last week, and this morning. Sensation of tachycardia, palpitations,  Denies any chest pain, any fever, any nausea or diaphoresis, in ED patient was noticed to be in A. Fib with RVR heart rate in 140s, for which she has been started on Cardizem drip, patient reports minimal amount of hemoptysis this a.m.  She denies any leg edema or swelling, or chest pain, patient on warfarin for A. Fib with therapeutic INR at 2.6.    Review of systems:    In addition to the HPI above,  No Fever-chills, No Headache, No changes with Vision or hearing, No problems swallowing food or Liquids, No Chest pain, Cough progressive dyspnea, ports palpitation this a.m. No Abdominal pain, No Nausea or Vommitting, Bowel movements are regular, No Blood in stool or Urine, No dysuria, No new skin rashes or bruises, No new joints pains-aches,  No new weakness, tingling, numbness in any extremity, No recent weight gain or loss, No polyuria, polydypsia or polyphagia, No significant Mental Stressors.  A full 10 point Review of Systems was done, except as stated  above, all other Review of Systems were negative.   With Past History of the following :    Past Medical History:  Diagnosis Date  . Aortic stenosis    mild AS witm mild to mod AR by 11/2012 echo  . Arthritis   . Atrial fibrillation ([place])   . Atrial flutter ([place])   . Bronchitis, chronic ([place])   . Cervical dysplasia   . Coronary artery disease   . Cystocele    either on bladder or kidneys patient is unsure but stated physician will watch it  . Dizziness    If patient gets out of bed too fast  . Family history of anesthesia complication    Hard for daughter to wake up after Anesthesia  . Gout   . H/O hiatal hernia   . History of Bell's palsy   . Migraine headache   . Osteopenia   . Rosacea conjunctivitis    Right eye is worse than left eye  . Seasonal allergies   . Shortness of breath  with exertion  . Thyroid disease    Hypo  . Tobacco abuse   . Unspecified vitamin D deficiency       Past Surgical History:  Procedure Laterality Date  . ABDOMINAL AORTIC ENDOVASCULAR STENT GRAFT N/A 04/11/2013   Procedure: ABDOMINAL AORTIC ENDOVASCULAR STENT GRAFT WITH RIGHT FEMORAL PATCH ANGIOPLASTY;  Surgeon: Mal Misty, MD;  Location: [facility];  Service: Vascular;  Laterality: N/A;  . BREAST SURGERY     LEFT BREAST BIOPSY  . broken leg Left 1970s  . CARDIAC CATHETERIZATION    . CHOLECYSTECTOMY  1972  . COLONOSCOPY    . EYE SURGERY Bilateral   . Laser vein procedure    . REFRACTIVE SURGERY    . TONSILLECTOMY        Social History:     Social History  Substance Use Topics  . Smoking status: Former Smoker    Types: Cigarettes    Quit date: 08/10/2002  . Smokeless tobacco: Never Used     Comment: History of tobacco abuse  . Alcohol use 0.0 oz/week     Comment: rare     Lives - at home  Mobility - with assistance     Family History :     Family History  Problem Relation Age of Onset  . Cirrhosis Mother   . Cancer Mother 48    PANCREAS  . Heart defect  Sister   . Breast cancer Sister     age 60  . Heart disease Sister   . Stroke Sister   . Alcohol abuse Father   . Depression Father   . Hypertension Brother   . Hyperlipidemia Son   . Heart disease Daughter       Home Medications:   Prior to Admission medications   Medication Sig Start Date End Date Taking? Authorizing Provider  albuterol (PROVENTIL HFA;VENTOLIN HFA) 108 (90 BASE) MCG/ACT inhaler 2 puffs 5 minutes apart every 4 to 6 hours as needed to rescue asthma Patient taking differently: Inhale 2 puffs into the lungs 5 minutes apart every 4 to 6 hours as needed to rescue asthma 08/13/13 12/23/15 Yes Melissa Smith, PA-C  allopurinol (ZYLOPRIM) 300 MG tablet TAKE 1 TABLET BY MOUTH EVERY DAY 11/27/15  Yes Vicie Mutters, PA-C  aspirin 81 MG tablet Take 81 mg by mouth daily.     Yes Historical Provider, MD  azelastine (ASTELIN) 0.1 % nasal spray Place 2 sprays into both nostrils 2 (two) times daily. Use in each nostril as directed 09/04/15  Yes Vicie Mutters, PA-C  bisoprolol-hydrochlorothiazide [facility]) 5-6.25 MG tablet Take 1 tablet by mouth daily. 02/12/15  Yes Vicie Mutters, PA-C  Cetirizine HCl 10 MG CAPS Take 1 capsule (10 mg total) by mouth at bedtime. 07/03/15  Yes Courtney Forcucci, PA-C  furosemide (LASIX) 80 MG tablet TAKE 1 TABLET BY MOUTH TWICE A DAY AS NEEDED FOR FLUID Patient taking differently: Take 40 mg by mouth daily. TAKE 1 TABLET BY MOUTH TWICE A DAY AS NEEDED FOR FLUID 05/06/15  Yes Courtney Forcucci, PA-C  gabapentin (NEURONTIN) 300 MG capsule TAKE ONE CAPSULE BY MOUTH 3 TIMES A DAY 11/14/15  Yes Vicie Mutters, PA-C  levothyroxine (SYNTHROID, LEVOTHROID) 50 MCG tablet Take 50 mcg by mouth daily. 11/29/15  Yes Historical Provider, MD  montelukast (SINGULAIR) 10 MG tablet TAKE 1 TABLET EVERY DAY FOR ALLERGIES 08/19/15  Yes [name], MD  potassium chloride SA (K-DUR,KLOR-CON) 20 MEQ tablet Take 2 tablets (40 mEq total) by mouth daily. 05/06/15  Yes  Courtney Forcucci,  PA-C  pravastatin (PRAVACHOL) 40 MG tablet TAKE 1 TABLET BY MOUTH AT BEDTIME FOR CHOLESTEROL 07/08/15  Yes [name], MD  prochlorperazine (COMPAZINE) 5 MG tablet TAKE 1 TABLET BY MOUTH 3 TIMES A DAY FOR VERTIGO OR NAUSEA 09/16/15  Yes [name], MD  benazepril (LOTENSIN) 20 MG tablet Take 20 mg by mouth daily.    Historical Provider, MD  digoxin (LANOXIN) 0.25 MG tablet TAKE 1 TABLET BY MOUTH DAILY 09/09/14   [name], MD  ipratropium (ATROVENT) 0.02 % nebulizer solution Take 0.5 mg by nebulization every 4 (four) hours as needed for wheezing or shortness of breath.  12/25/13   Historical Provider, MD  ipratropium-albuterol (DUONEB) 0.5-2.5 (3) MG/3ML SOLN Use 3 cc  4 x day or every 4 hours if needed for shortness of breath. 12/25/13   [name], MD  meclizine (ANTIVERT) 25 MG tablet Take 1 tablet (25 mg total) by mouth 3 (three) times daily as needed for dizziness. 04/18/15   Courtney Forcucci, PA-C  metolazone (ZAROXOLYN) 2.5 MG tablet Take 1 tablet (2.5 mg total) by mouth daily. 05/06/15 05/05/16  Courtney Forcucci, PA-C  warfarin (COUMADIN) 2 MG tablet TAKE 1 TO 2 TABLETS BY MOUTH DAILY OR AS DIRECTED 03/05/15   [name], MD     Allergies:     Allergies  Allergen Reactions  . Advair Diskus [Fluticasone-Salmeterol] Other (See Comments)    SKIN CHANGES.  Can tolerate low dose of prednisone without complications  . Amiodarone Other (See Comments)    PULMONARY TOXICITY  . Codeine     unknown  . Diovan [Valsartan] Other (See Comments)    HYPOTENSION  . Doxycycline Diarrhea and Other (See Comments)    VISUAL DISTURBANCE  . Flexeril [Cyclobenzaprine] Other (See Comments)    FATIGUE  . Keflex [Cephalexin] Diarrhea  . Verapamil Other (See Comments)    EDEMA     Physical Exam:   Vitals  Blood pressure 141/74, pulse 116, temperature 98.3 F (36.8 C), temperature source Oral, resp. rate (!) 27, SpO2 91 %.   1. General ell-developed elderly female lying in bed  in NAD,   2. Normal affect and insight, Not Suicidal or Homicidal, Awake Alert, Oriented X 3.  3. No F.N deficits, ALL C.Nerves Intact, Strength 5/5 all 4 extremities, Sensation intact all 4 extremities, Plantars down going.  4. Ears and Eyes appear Normal, Conjunctivae clear, PERRLA. Moist Oral Mucosa.  5. Supple Neck, No JVD, No cervical lymphadenopathy appriciated, No Carotid Bruits.  6. Symmetrical Chest wall movement, Good air movement bilaterally, right lung base Rales  7. iirregular irregular, No Gallops, Rubs or Murmurs, No Parasternal Heave.  8. Positive Bowel Sounds, Abdomen Soft, No tenderness, No organomegaly appriciated,No rebound -guarding or rigidity.  9.  No Cyanosis, Normal Skin Turgor, No Skin Rash or Bruise.  10. Good muscle tone,  joints appear normal , no effusions, Normal ROM.  11. No Palpable Lymph Nodes in Neck or Axillae   Data Review:    CBC  Recent Labs Lab 12/23/15 1340  WBC 15.6*  HGB 15.8*  HCT 46.4*  PLT 175  MCV 94.3  MCH 32.1  MCHC 34.1  RDW 14.7   ------------------------------------------------------------------------------------------------------------------  Chemistries   Recent Labs Lab 12/23/15 1340  NA 136  K 4.8  CL 100*  CO2 25  GLUCOSE 119*  BUN 20  CREATININE 1.44*  CALCIUM 9.8   ------------------------------------------------------------------------------------------------------------------ CrCl cannot be calculated (Unknown ideal weight.). ------------------------------------------------------------------------------------------------------------------ No results for input(s): TSH, T4TOTAL, T3FREE,  THYROIDAB in the last 72 hours.  Invalid input(s): FREET3  Coagulation profile  Recent Labs Lab 12/23/15 1506  INR 2.54   ------------------------------------------------------------------------------------------------------------------- No results for input(s): DDIMER in the last 72  hours. -------------------------------------------------------------------------------------------------------------------  Cardiac Enzymes No results for input(s): CKMB, TROPONINI, MYOGLOBIN in the last 168 hours.  Invalid input(s): CK ------------------------------------------------------------------------------------------------------------------    Component Value Date/Time   BNP 282.2 (H) 12/23/2015 1506   BNP 124.4 (H) 07/25/2014 1125     ---------------------------------------------------------------------------------------------------------------  Urinalysis    Component Value Date/Time   COLORURINE YELLOW 12/09/2015 [place] 12/09/2015 1042   LABSPEC 1.017 12/09/2015 1042   PHURINE 5.5 12/09/2015 1042   GLUCOSEU NEGATIVE 12/09/2015 1042   HGBUR NEGATIVE 12/09/2015 1042   BILIRUBINUR NEGATIVE 12/09/2015 1042   KETONESUR NEGATIVE 12/09/2015 1042   PROTEINUR NEGATIVE 12/09/2015 1042   UROBILINOGEN 0.2 05/08/2014 1232   NITRITE NEGATIVE 12/09/2015 1042   LEUKOCYTESUR TRACE (A) 12/09/2015 1042    ----------------------------------------------------------------------------------------------------------------   Imaging Results:    Dg Chest Portable 1 View  Result Date: 12/23/2015 CLINICAL DATA:  Arrhythmia. Spitting up blood. Atrial fibrillation. EXAM: PORTABLE CHEST 1 VIEW COMPARISON:  03/18/2014 FINDINGS: Cardiac silhouette is enlarged. Atherosclerotic calcifications at the aortic arch. Lungs are essentially clear without pulmonary edema. No large pleural effusions. No acute bone abnormality. IMPRESSION: Cardiomegaly without focal airspace disease or pulmonary edema. Aortic atherosclerosis. Electronically Signed   By: Markus Daft M.D.   On: 12/23/2015 14:46    My personal review of EKG: Rhythm NSR, Rate 133  /min, QTc 479  , no Acute ST changes   Assessment & Plan:    Active Problems:   Essential hypertension   Congestive heart failure (HCC)    COPD (chronic obstructive pulmonary disease) with chronic bronchitis (HCC)   GERD   Hypothyroidism   Long term current use of anticoagulant therapy   CKD  stage III (GFR 51 ml/min)   Hyperlipidemia   Atrial fibrillation with rapid ventricular response ([place])    A. Fib with RVR - patient with known history of A. Fib with RVR,compliant with her medication - Will admit to stepdown, to continue with Cardizem drip, continue with digoxin, will hold bisoprolol given soft blood pressure, Will obtain 2-D echo given more recent in 2014, will obtain TSH, will cycle cardiac enzymes. 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 Fib with RVR, but given she has hemoptysis  In A. Fib with RVR mixed concerned about PE, even though low probability where she is fully anticoagulated, will obtain V/Q scan to rule out PE.  Essential hypertension - We'll hold medication giving soft blood pressure  COPD - no active wheezing,   GERD - Continue with PPI  Hypothyroidism - Continue Synthroid, check TSH  CKD stage III - at baseline, avoid nephrotoxic medication  Nonischemic cardiopathy - Most recent echo 2014 showing EF of 50%, will repeat 2-D echo, will hold diuresis given soft blood pressure on Cardizem   DVT Prophylaxis on Heaprin  AM Labs Ordered, also please review Full Orders  Family Communication: Admission, patients condition and plan of care including tests being ordered have been discussed with the patient and  who indicate understanding and agree with the plan and Code Status.  Code Status Full  Likely DC to : pending PT  Condition GUARDED  Consults called: cards called by ED  Admission status: Observation  Time spent in minutes : 65 minutes   Francee Setzer M.D on 12/23/2015 at  5:58 PM  Between 7am to 7pm - Pager - [phone]. After 7pm go to [email or web address] - password [email or web address]  [facility] - Office  [phone]

## 2015-12-23 NOTE — Consult Note (Addendum)
CONSULTATION NOTE  Reason for Consult: A-fib with RVR  Requesting Physician: Dr. elgergawy  Cardiologist: Dr. Lovena Le  HPI: This is a 75 y.o. female with a past medical history significant for permanent atrial fibrillation, systolic and diastolic heart failure with a history of class II symptoms, history of AAA status post repair, mild to moderate aortic stenosis, history of atrial flutter and coronary artery disease. She also has a 50 year smoking history and likely has COPD however it is not described in her medical history. 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 She presents with dyspnea and tachycardia and over the past week her symptoms have worsened significantly. Today she could barely walk across the room without being short of breath. She was found to be in A. fib with RVR with heart rate in the 140s. Chest x-ray shows cardiomegaly without focal airspace disease or pulmonary edema. She was 93% on a 3 L nasal cannula. There was concern for possibility of PE but due to renal dysfunction a VQ scan was ordered. Cardiology is asked to consult regarding A. fib with RVR.  PMHx:  Past Medical History:  Diagnosis Date  . Aortic stenosis    mild AS witm mild to mod AR by 11/2012 echo  . Arthritis   . Atrial fibrillation ([place])   . Atrial flutter ([place])   . Bronchitis, chronic ([place])   . Cervical dysplasia   . Coronary artery disease   . Cystocele    either on bladder or kidneys patient is unsure but stated physician will watch it  . Dizziness    If patient gets out of bed too fast  . Family history of anesthesia complication    Hard for daughter to wake up after Anesthesia  . Gout   . H/O hiatal hernia   . History of Bell's palsy   . Migraine headache   . Osteopenia   . Rosacea conjunctivitis    Right eye is worse than left eye  . Seasonal allergies   . Shortness of breath    with exertion  . Thyroid disease    Hypo  . Tobacco abuse   . Unspecified  vitamin D deficiency    Past Surgical History:  Procedure Laterality Date  . ABDOMINAL AORTIC ENDOVASCULAR STENT GRAFT N/A 04/11/2013   Procedure: ABDOMINAL AORTIC ENDOVASCULAR STENT GRAFT WITH RIGHT FEMORAL PATCH ANGIOPLASTY;  Surgeon: Mal Misty, MD;  Location: [facility];  Service: Vascular;  Laterality: N/A;  . BREAST SURGERY     LEFT BREAST BIOPSY  . broken leg Left 1970s  . CARDIAC CATHETERIZATION    . CHOLECYSTECTOMY  1972  . COLONOSCOPY    . EYE SURGERY Bilateral   . Laser vein procedure    . REFRACTIVE SURGERY    . TONSILLECTOMY      FAMHx: Family History  Problem Relation Age of Onset  . Cirrhosis Mother   . Cancer Mother 62    PANCREAS  . Heart defect Sister   . Breast cancer Sister     age 80  . Heart disease Sister   . Stroke Sister   . Alcohol abuse Father   . Depression Father   . Hypertension Brother   . Hyperlipidemia Son   . Heart disease Daughter     SOCHx:  reports that she quit smoking about 13 years ago. Her smoking use included Cigarettes. She has never used smokeless tobacco. She reports that she drinks alcohol. She reports that she does  not use drugs.  ALLERGIES: Allergies  Allergen Reactions  . Advair Diskus [Fluticasone-Salmeterol] Other (See Comments)    SKIN CHANGES.  Can tolerate low dose of prednisone without complications  . Amiodarone Other (See Comments)    PULMONARY TOXICITY  . Codeine     unknown  . Diovan [Valsartan] Other (See Comments)    HYPOTENSION  . Doxycycline Diarrhea and Other (See Comments)    VISUAL DISTURBANCE  . Flexeril [Cyclobenzaprine] Other (See Comments)    FATIGUE  . Keflex [Cephalexin] Diarrhea  . Verapamil Other (See Comments)    EDEMA    ROS: Pertinent items noted in HPI and remainder of comprehensive ROS otherwise negative.  HOME MEDICATIONS: No current facility-administered medications on file prior to encounter.    Current Outpatient Prescriptions on File Prior to Encounter  Medication Sig  Dispense Refill  . albuterol (PROVENTIL HFA;VENTOLIN HFA) 108 (90 BASE) MCG/ACT inhaler 2 puffs 5 minutes apart every 4 to 6 hours as needed to rescue asthma (Patient taking differently: Inhale 2 puffs into the lungs 5 minutes apart every 4 to 6 hours as needed to rescue asthma) 1 Inhaler 11  . allopurinol (ZYLOPRIM) 300 MG tablet TAKE 1 TABLET BY MOUTH EVERY DAY 90 tablet 1  . aspirin 81 MG tablet Take 81 mg by mouth daily.      Marland Kitchen azelastine (ASTELIN) 0.1 % nasal spray Place 2 sprays into both nostrils 2 (two) times daily. Use in each nostril as directed 30 mL 2  . Cetirizine HCl 10 MG CAPS Take 1 capsule (10 mg total) by mouth at bedtime. 90 capsule 2  . furosemide (LASIX) 80 MG tablet TAKE 1 TABLET BY MOUTH TWICE A DAY AS NEEDED FOR FLUID (Patient taking differently: Take 40 mg by mouth daily. TAKE 1 TABLET BY MOUTH TWICE A DAY AS NEEDED FOR FLUID) 180 tablet 1  . gabapentin (NEURONTIN) 300 MG capsule TAKE ONE CAPSULE BY MOUTH 3 TIMES A DAY 270 capsule 1  . montelukast (SINGULAIR) 10 MG tablet TAKE 1 TABLET EVERY DAY FOR ALLERGIES 90 tablet 1  . pravastatin (PRAVACHOL) 40 MG tablet TAKE 1 TABLET BY MOUTH AT BEDTIME FOR CHOLESTEROL 90 tablet 1  . prochlorperazine (COMPAZINE) 5 MG tablet TAKE 1 TABLET BY MOUTH 3 TIMES A DAY FOR VERTIGO OR NAUSEA 90 tablet 1  . benazepril (LOTENSIN) 20 MG tablet Take 20 mg by mouth daily.    . bisoprolol-hydrochlorothiazide (ZIAC) 5-6.25 MG tablet Take 1 tablet by mouth daily. 90 tablet 3  . digoxin (LANOXIN) 0.25 MG tablet TAKE 1 TABLET BY MOUTH DAILY 90 tablet 3  . ipratropium (ATROVENT) 0.02 % nebulizer solution Take 0.5 mg by nebulization every 4 (four) hours as needed for wheezing or shortness of breath.     Marland Kitchen ipratropium-albuterol (DUONEB) 0.5-2.5 (3) MG/3ML SOLN Use 3 cc  4 x day or every 4 hours if needed for shortness of breath. 360 mL 99  . meclizine (ANTIVERT) 25 MG tablet Take 1 tablet (25 mg total) by mouth 3 (three) times daily as needed for dizziness.  60 tablet 1  . metolazone (ZAROXOLYN) 2.5 MG tablet Take 1 tablet (2.5 mg total) by mouth daily. 10 tablet 0  . potassium chloride SA (K-DUR,KLOR-CON) 20 MEQ tablet Take 2 tablets (40 mEq total) by mouth daily. 30 tablet 0  . warfarin (COUMADIN) 2 MG tablet TAKE 1 TO 2 TABLETS BY MOUTH DAILY OR AS DIRECTED 180 tablet 3    HOSPITAL MEDICATIONS: Prior to Admission:  (Not in a  hospital admission)  VITALS: Blood pressure 141/74, pulse 116, temperature 98.3 F (36.8 C), temperature source Oral, resp. rate (!) 27, SpO2 91 %.  PHYSICAL EXAM: General appearance: alert and mild distress Neck: no carotid bruit and no JVD Lungs: diminished breath sounds bilaterally, rhonchi bilaterally and wheezes bilaterally Heart: irregularly irregular rhythm Abdomen: soft, non-tender; bowel sounds normal; no masses,  no organomegaly Extremities: extremities normal, atraumatic, no cyanosis or edema Pulses: 2+ and symmetric Skin: Skin color, texture, turgor normal. No rashes or lesions Neurologic: Grossly normal Psych: Pleasant  LABS: Results for orders placed or performed during the hospital encounter of 12/23/15 (from the past 48 hour(s))  Basic metabolic panel     Status: Abnormal   Collection Time: 12/23/15  1:40 PM  Result Value Ref Range   Sodium 136 135 - 145 mmol/L   Potassium 4.8 3.5 - 5.1 mmol/L    Comment: HEMOLYSIS AT THIS LEVEL MAY AFFECT RESULT   Chloride 100 (L) 101 - 111 mmol/L   CO2 25 22 - 32 mmol/L   Glucose, Bld 119 (H) 65 - 99 mg/dL   BUN 20 6 - 20 mg/dL   Creatinine, Ser 1.44 (H) 0.44 - 1.00 mg/dL   Calcium 9.8 8.9 - 10.3 mg/dL   GFR calc non Af Amer 35 (L) >60 mL/min   GFR calc Af Amer 41 (L) >60 mL/min    Comment: (NOTE) The eGFR has been calculated using the CKD EPI equation. This calculation has not been validated in all clinical situations. eGFR's persistently <60 mL/min signify possible Chronic Kidney Disease.    Anion gap 11 5 - 15  CBC     Status: Abnormal    Collection Time: 12/23/15  1:40 PM  Result Value Ref Range   WBC 15.6 (H) 4.0 - 10.5 K/uL   RBC 4.92 3.87 - 5.11 MIL/uL   Hemoglobin 15.8 (H) 12.0 - 15.0 g/dL   HCT 46.4 (H) 36.0 - 46.0 %   MCV 94.3 78.0 - 100.0 fL   MCH 32.1 26.0 - 34.0 pg   MCHC 34.1 30.0 - 36.0 g/dL   RDW 14.7 11.5 - 15.5 %   Platelets 175 150 - 400 K/uL  Digoxin level     Status: None   Collection Time: 12/23/15  1:40 PM  Result Value Ref Range   Digoxin Level 0.8 0.8 - 2.0 ng/mL  I-stat troponin, ED     Status: None   Collection Time: 12/23/15  2:03 PM  Result Value Ref Range   Troponin i, poc 0.01 0.00 - 0.08 ng/mL   Comment 3            Comment: Due to the release kinetics of cTnI, a negative result within the first hours of the onset of symptoms does not rule out myocardial infarction with certainty. If myocardial infarction is still suspected, repeat the test at appropriate intervals.   Brain natriuretic peptide     Status: Abnormal   Collection Time: 12/23/15  3:06 PM  Result Value Ref Range   B Natriuretic Peptide 282.2 (H) 0.0 - 100.0 pg/mL  Protime-INR     Status: Abnormal   Collection Time: 12/23/15  3:06 PM  Result Value Ref Range   Prothrombin Time 27.8 (H) 11.4 - 15.2 seconds   INR 2.54   I-Stat Troponin, ED (not at [facility])     Status: None   Collection Time: 12/23/15  5:41 PM  Result Value Ref Range   Troponin i, poc 0.03 0.00 - 0.08  ng/mL   Comment 3            Comment: Due to the release kinetics of cTnI, a negative result within the first hours of the onset of symptoms does not rule out myocardial infarction with certainty. If myocardial infarction is still suspected, repeat the test at appropriate intervals.     IMAGING: Dg Chest Portable 1 View  Result Date: 12/23/2015 CLINICAL DATA:  Arrhythmia. Spitting up blood. Atrial fibrillation. EXAM: PORTABLE CHEST 1 VIEW COMPARISON:  03/18/2014 FINDINGS: Cardiac silhouette is enlarged. Atherosclerotic calcifications at the aortic  arch. Lungs are essentially clear without pulmonary edema. No large pleural effusions. No acute bone abnormality. IMPRESSION: Cardiomegaly without focal airspace disease or pulmonary edema. Aortic atherosclerosis. Electronically Signed   By: Markus Daft M.D.   On: 12/23/2015 14:46    HOSPITAL DIAGNOSES: Active Problems:   Essential hypertension   Congestive heart failure (HCC)   COPD (chronic obstructive pulmonary disease) with chronic bronchitis (HCC)   GERD   Hypothyroidism   Long term current use of anticoagulant therapy   CKD  stage III (GFR 51 ml/min)   Hyperlipidemia   Atrial fibrillation with rapid ventricular response (HCC)   IMPRESSION: 1. Permanent A. fib (CHADSVASC 5) with rapid ventricular response 2. Acute hypoxic respiratory failure secondary to likely COPD exacerbation 3. History of mixed systolic and diastolic congestive heart failure  RECOMMENDATION: 1. [name] presented with A. fib with RVR in the setting of hypoxia, significant dyspnea and pulmonary distress likely secondary to COPD exacerbation. Her daughter noted viral respiratory illness was going through the family. She reports cough but denies fever, chills or body aches. Lungs indicate significant rhonchi and scattered wheezes. I do not appreciate compensated heart failure on exam although she is at risk for this. I agree with a repeat echo. She's currently on diltiazem for rate control, however her rapid ventricular response is likely related to her underlying hypoxia and lung disease - since her A. fib is chronic. Plan for VQ scan although pulmonary embolus seems less likely.  Thanks for the consultation. Cardiology will follow with you.  Time Spent Directly with Patient: 30 minutes  Pixie Casino, MD, [facility] Attending Cardiologist [place] 12/23/2015, 6:34 PM

## 2015-12-23 NOTE — ED Notes (Signed)
MD at bedside. Katie [name]

## 2015-12-23 NOTE — ED Notes (Signed)
Family at bedside. 

## 2015-12-23 NOTE — ED Notes (Signed)
Attempted to call report

## 2015-12-23 NOTE — ED Notes (Signed)
Pt yelling out "help," went in to check on pt, pt appeared very anxious, pt states that she has been trying to call her husband but she cannot get the phone to work.  She also states "I need to sit in the chair."  Pt made aware that it's not a good idea to get OOB d/t her status.  Suggested for pt to sit on the side of the bed while this nurse is at bedside.  Pt states she wants to sit in a straight chair.  Pt continues to try to call her husband and daughters and finally was able to reach them.

## 2015-12-23 NOTE — Progress Notes (Signed)
ANTICOAGULATION CONSULT NOTE - Initial Consult  Pharmacy Consult for coumadin Indication: atrial fibrillation  Allergies  Allergen Reactions  . Advair Diskus [Fluticasone-Salmeterol] Other (See Comments)    SKIN CHANGES.  Can tolerate low dose of prednisone without complications  . Amiodarone Other (See Comments)    PULMONARY TOXICITY  . Codeine     unknown  . Diovan [Valsartan] Other (See Comments)    HYPOTENSION  . Doxycycline Diarrhea and Other (See Comments)    VISUAL DISTURBANCE  . Flexeril [Cyclobenzaprine] Other (See Comments)    FATIGUE  . Keflex [Cephalexin] Diarrhea  . Verapamil Other (See Comments)    EDEMA    Patient Measurements: Height: 5\' 3"  (160 cm) Weight: 202 lb 2.6 oz (91.7 kg) IBW/kg (Calculated) : 52.4  Vital Signs: Temp: 97.7 F (36.5 C) (10/10 2100) Temp Source: Oral (10/10 2100) BP: 121/68 (10/10 2100) Pulse Rate: 85 (10/10 2100)  Labs:  Recent Labs  12/23/15 1340 12/23/15 1506  HGB 15.8*  --   HCT 46.4*  --   PLT 175  --   LABPROT  --  27.8*  INR  --  2.54  CREATININE 1.44*  --     Estimated Creatinine Clearance: 37.4 mL/min (by C-G formula based on SCr of 1.44 mg/dL (H)).   Medical History: Past Medical History:  Diagnosis Date  . Aortic stenosis    mild AS witm mild to mod AR by 11/2012 echo  . Arthritis   . Atrial fibrillation ([place])   . Atrial flutter ([place])   . Bronchitis, chronic ([place])   . Cervical dysplasia   . Coronary artery disease   . Cystocele    either on bladder or kidneys patient is unsure but stated physician will watch it  . Dizziness    If patient gets out of bed too fast  . Family history of anesthesia complication    Hard for daughter to wake up after Anesthesia  . Gout   . H/O hiatal hernia   . History of Bell's palsy   . Migraine headache   . Osteopenia   . Rosacea conjunctivitis    Right eye is worse than left eye  . Seasonal allergies   . Shortness of breath    with exertion  . Thyroid  disease    Hypo  . Tobacco abuse   . Unspecified vitamin D deficiency     Medications:  See EMR  Assessment: 49 YOF on coumadin PTA for Afib to continue inpatient. INR on admission therapeutic at 2.54. Last dose 10/10 at ~0800. Per conversation with med rec, pt seems confused about the medications she is taking. Claims she only uses [facility] but they have no record of any recent coumadin Rx system-wide. Appointment note from Dr. Melford Aase on 9/12 shows coumadin dose changed to 2mg  daily. Pt therapeutic onward. Will use as a starting point for dosing. Hgb 15.8, plt 175. No bleeding reported.   Goal of Therapy:  INR 2-3 Monitor platelets by anticoagulation protocol: Yes   Plan:  -Hold coumadin tonight (dose already taken PTA) -Daily INR -Monitor CBC, s/sx bleeding   Stephens November, PharmD Clinical Pharmacist 9:27 PM, 12/23/2015

## 2015-12-23 NOTE — ED Triage Notes (Signed)
Pt states "about 1200 my heart started beating out of rhythm." pt states she also has been spitting up blood. Pt in afib RVR on ekg. Pt c/o SOB. Denies pain.

## 2015-12-24 ENCOUNTER — Observation Stay (HOSPITAL_COMMUNITY): Payer: Medicare Other

## 2015-12-24 ENCOUNTER — Inpatient Hospital Stay (HOSPITAL_COMMUNITY): Payer: Medicare Other

## 2015-12-24 DIAGNOSIS — J441 Chronic obstructive pulmonary disease with (acute) exacerbation: Secondary | ICD-10-CM | POA: Diagnosis present

## 2015-12-24 DIAGNOSIS — Z9889 Other specified postprocedural states: Secondary | ICD-10-CM | POA: Diagnosis not present

## 2015-12-24 DIAGNOSIS — R0602 Shortness of breath: Secondary | ICD-10-CM | POA: Diagnosis not present

## 2015-12-24 DIAGNOSIS — I130 Hypertensive heart and chronic kidney disease with heart failure and stage 1 through stage 4 chronic kidney disease, or unspecified chronic kidney disease: Secondary | ICD-10-CM | POA: Diagnosis present

## 2015-12-24 DIAGNOSIS — Z9049 Acquired absence of other specified parts of digestive tract: Secondary | ICD-10-CM | POA: Diagnosis not present

## 2015-12-24 DIAGNOSIS — I10 Essential (primary) hypertension: Secondary | ICD-10-CM

## 2015-12-24 DIAGNOSIS — I5020 Unspecified systolic (congestive) heart failure: Secondary | ICD-10-CM | POA: Diagnosis not present

## 2015-12-24 DIAGNOSIS — J440 Chronic obstructive pulmonary disease with acute lower respiratory infection: Secondary | ICD-10-CM | POA: Diagnosis present

## 2015-12-24 DIAGNOSIS — E039 Hypothyroidism, unspecified: Secondary | ICD-10-CM | POA: Diagnosis present

## 2015-12-24 DIAGNOSIS — R079 Chest pain, unspecified: Secondary | ICD-10-CM | POA: Diagnosis not present

## 2015-12-24 DIAGNOSIS — A413 Sepsis due to Hemophilus influenzae: Secondary | ICD-10-CM | POA: Diagnosis not present

## 2015-12-24 DIAGNOSIS — J449 Chronic obstructive pulmonary disease, unspecified: Secondary | ICD-10-CM | POA: Diagnosis not present

## 2015-12-24 DIAGNOSIS — J9601 Acute respiratory failure with hypoxia: Secondary | ICD-10-CM | POA: Diagnosis not present

## 2015-12-24 DIAGNOSIS — R0902 Hypoxemia: Secondary | ICD-10-CM | POA: Diagnosis present

## 2015-12-24 DIAGNOSIS — N179 Acute kidney failure, unspecified: Secondary | ICD-10-CM | POA: Diagnosis not present

## 2015-12-24 DIAGNOSIS — Z87891 Personal history of nicotine dependence: Secondary | ICD-10-CM | POA: Diagnosis not present

## 2015-12-24 DIAGNOSIS — N183 Chronic kidney disease, stage 3 (moderate): Secondary | ICD-10-CM | POA: Diagnosis not present

## 2015-12-24 DIAGNOSIS — R042 Hemoptysis: Secondary | ICD-10-CM | POA: Diagnosis present

## 2015-12-24 DIAGNOSIS — I4891 Unspecified atrial fibrillation: Secondary | ICD-10-CM | POA: Diagnosis not present

## 2015-12-24 DIAGNOSIS — Z7901 Long term (current) use of anticoagulants: Secondary | ICD-10-CM | POA: Diagnosis not present

## 2015-12-24 DIAGNOSIS — E785 Hyperlipidemia, unspecified: Secondary | ICD-10-CM | POA: Diagnosis present

## 2015-12-24 DIAGNOSIS — A419 Sepsis, unspecified organism: Secondary | ICD-10-CM | POA: Diagnosis not present

## 2015-12-24 DIAGNOSIS — I350 Nonrheumatic aortic (valve) stenosis: Secondary | ICD-10-CM | POA: Diagnosis present

## 2015-12-24 DIAGNOSIS — J14 Pneumonia due to Hemophilus influenzae: Secondary | ICD-10-CM | POA: Diagnosis not present

## 2015-12-24 DIAGNOSIS — Z888 Allergy status to other drugs, medicaments and biological substances status: Secondary | ICD-10-CM | POA: Diagnosis not present

## 2015-12-24 DIAGNOSIS — Z803 Family history of malignant neoplasm of breast: Secondary | ICD-10-CM | POA: Diagnosis not present

## 2015-12-24 DIAGNOSIS — I5031 Acute diastolic (congestive) heart failure: Secondary | ICD-10-CM | POA: Diagnosis not present

## 2015-12-24 DIAGNOSIS — G510 Bell's palsy: Secondary | ICD-10-CM | POA: Diagnosis present

## 2015-12-24 DIAGNOSIS — R918 Other nonspecific abnormal finding of lung field: Secondary | ICD-10-CM | POA: Diagnosis not present

## 2015-12-24 DIAGNOSIS — I2510 Atherosclerotic heart disease of native coronary artery without angina pectoris: Secondary | ICD-10-CM | POA: Diagnosis present

## 2015-12-24 DIAGNOSIS — I429 Cardiomyopathy, unspecified: Secondary | ICD-10-CM | POA: Diagnosis present

## 2015-12-24 DIAGNOSIS — K219 Gastro-esophageal reflux disease without esophagitis: Secondary | ICD-10-CM | POA: Diagnosis present

## 2015-12-24 DIAGNOSIS — J189 Pneumonia, unspecified organism: Secondary | ICD-10-CM | POA: Diagnosis not present

## 2015-12-24 DIAGNOSIS — I5043 Acute on chronic combined systolic (congestive) and diastolic (congestive) heart failure: Secondary | ICD-10-CM | POA: Diagnosis not present

## 2015-12-24 DIAGNOSIS — I482 Chronic atrial fibrillation: Secondary | ICD-10-CM | POA: Diagnosis present

## 2015-12-24 LAB — CBC
HEMATOCRIT: 41.4 % (ref 36.0–46.0)
HEMOGLOBIN: 13.9 g/dL (ref 12.0–15.0)
MCH: 31.8 pg (ref 26.0–34.0)
MCHC: 33.6 g/dL (ref 30.0–36.0)
MCV: 94.7 fL (ref 78.0–100.0)
Platelets: 167 10*3/uL (ref 150–400)
RBC: 4.37 MIL/uL (ref 3.87–5.11)
RDW: 15.1 % (ref 11.5–15.5)
WBC: 20.5 10*3/uL — AB (ref 4.0–10.5)

## 2015-12-24 LAB — STREP PNEUMONIAE URINARY ANTIGEN: STREP PNEUMO URINARY ANTIGEN: NEGATIVE

## 2015-12-24 LAB — ECHOCARDIOGRAM COMPLETE
HEIGHTINCHES: 63 in
Weight: 3234.59 oz

## 2015-12-24 LAB — PROTIME-INR
INR: 2.13
PROTHROMBIN TIME: 24.2 s — AB (ref 11.4–15.2)

## 2015-12-24 LAB — EXPECTORATED SPUTUM ASSESSMENT W REFEX TO RESP CULTURE

## 2015-12-24 LAB — EXPECTORATED SPUTUM ASSESSMENT W GRAM STAIN, RFLX TO RESP C

## 2015-12-24 LAB — TSH: TSH: 2.022 u[IU]/mL (ref 0.350–4.500)

## 2015-12-24 LAB — LACTIC ACID, PLASMA: LACTIC ACID, VENOUS: 1.7 mmol/L (ref 0.5–1.9)

## 2015-12-24 MED ORDER — FUROSEMIDE 10 MG/ML IJ SOLN
40.0000 mg | Freq: Two times a day (BID) | INTRAMUSCULAR | Status: AC
  Administered 2015-12-24 – 2015-12-25 (×3): 40 mg via INTRAVENOUS
  Filled 2015-12-24 (×3): qty 4

## 2015-12-24 MED ORDER — LEVALBUTEROL HCL 1.25 MG/0.5ML IN NEBU: 1.2500 mg | INHALATION_SOLUTION | Freq: Four times a day (QID) | RESPIRATORY_TRACT | Status: DC | PRN

## 2015-12-24 MED ORDER — METHYLPREDNISOLONE SODIUM SUCC 40 MG IJ SOLR
40.0000 mg | Freq: Every day | INTRAMUSCULAR | Status: DC
  Administered 2015-12-24 – 2015-12-25 (×2): 40 mg via INTRAVENOUS
  Filled 2015-12-24 (×2): qty 1

## 2015-12-24 MED ORDER — WARFARIN SODIUM 2 MG PO TABS
3.0000 mg | ORAL_TABLET | Freq: Once | ORAL | Status: AC
  Administered 2015-12-24: 3 mg via ORAL
  Filled 2015-12-24: qty 1.5

## 2015-12-24 MED ORDER — DILTIAZEM HCL ER COATED BEADS 120 MG PO CP24
120.0000 mg | ORAL_CAPSULE | Freq: Every day | ORAL | Status: DC
  Administered 2015-12-24 – 2015-12-30 (×7): 120 mg via ORAL
  Filled 2015-12-24 (×9): qty 1

## 2015-12-24 MED ORDER — HYDROCODONE-ACETAMINOPHEN 5-325 MG PO TABS
1.0000 | ORAL_TABLET | ORAL | Status: DC | PRN
  Administered 2015-12-24: 2 via ORAL
  Administered 2015-12-24 – 2015-12-27 (×2): 1 via ORAL
  Administered 2015-12-27: 2 via ORAL
  Administered 2015-12-28: 1 via ORAL
  Filled 2015-12-24: qty 1
  Filled 2015-12-24: qty 2
  Filled 2015-12-24 (×2): qty 1
  Filled 2015-12-24: qty 2

## 2015-12-24 MED ORDER — WARFARIN - PHARMACIST DOSING INPATIENT
Freq: Every day | Status: DC
  Administered 2015-12-26 – 2015-12-27 (×2)

## 2015-12-24 MED ORDER — LEVOFLOXACIN IN D5W 750 MG/150ML IV SOLN
750.0000 mg | INTRAVENOUS | Status: DC
  Administered 2015-12-24 – 2015-12-26 (×2): 750 mg via INTRAVENOUS
  Filled 2015-12-24 (×3): qty 150

## 2015-12-24 MED ORDER — TRAMADOL HCL 50 MG PO TABS: 50.0000 mg | ORAL_TABLET | Freq: Four times a day (QID) | ORAL | Status: DC | PRN

## 2015-12-24 NOTE — Progress Notes (Signed)
Patient ID: Katie [name], female   DOB: 02-13-1941, 75 y.o.   MRN: [id number]    PROGRESS NOTE    Katie [name]  YNW:[id number] DOB: [birth year] DOA: 12/23/2015  PCP: [name], MD   Brief Narrative:  75 y.o. female, with past medical history of COPD, hypertension, atrial fibrillation on  Warfarin,nonischemic cardiomyopathy, nonobstructive coronary artery disease patient presents with complaints of palpitation and shortness of breath this a.m.reports  Has been having progressive dyspnea over few month, but then significantly worsened over last week.  Assessment & Plan:   Permanent A. fib (CHADSVASC 5) with rapid ventricular response - currently on cardizem for rate control  - also on digoxin and coumadin  - appreciate cardiology team assistance, keep on cardiac monitor for now  Acute hypoxic respiratory failure secondary to likely COPD exacerbation and RLL PNA - unknown pathogen at this time as CXR just obtained this AM - will place PNA focused order set and place pt on Levaquin IV for now - follow up on blood and sputum cultures - treat COPD with bronchodilators and steroids with planned tapering - keep on oxygen via [place] to keep O2 sats > 90%  Sepsis secondary to RLL PNA - pt did meet criteria for sepsis on admission with WBC 15K, RR > 20 and HR > 90 bpm - pneumonia order set placed which essential includes ABX, blood and sputum cultures  - would avoid fluid boluses at this time as pt not clinically dehydrated and has underlying combined CHF - add lactic acid to pneumonia order set  - no CBC this AM, will add to blood work order   Acute kidney injury - due to the above - BMP in AM  Acute on Chronic combined systolic and diastolic congestive heart failure - weight 202 lbs on admission - will ask for daily weights  - continue lasix IV per cardiology   Obesity  - Body mass index is 35.81 kg/m.   DVT prophylaxis: Coumadin  Code Status: Full  Family  Communication: Patient at bedside  Disposition Plan: Keep in SDU  Consultants:   Cardiology   Procedures:   None  Antimicrobials:   Levaquin 10/11 -->   Subjective: Right lower chest area pain.   Objective: Vitals:   12/24/15 1200 12/24/15 1300 12/24/15 1400 12/24/15 1600  BP: 124/70 109/74 98/63 (!) 107/52  Pulse: 86 85 90 71  Resp: (!) 24 (!) 27 (!) 28 (!) 21  Temp:      TempSrc:      SpO2: 100% 99% 90% 97%  Weight:      Height:        Intake/Output Summary (Last 24 hours) at 12/24/15 1606 Last data filed at 12/24/15 1400  Gross per 24 hour  Intake           225.92 ml  Output              550 ml  Net          -324.08 ml   Filed Weights   12/23/15 2100  Weight: 91.7 kg (202 lb 2.6 oz)    Examination:  General exam: Appears in mild distress due to pain   Respiratory system: rhonchi at bases with mild exp wheezing  Cardiovascular system: IRRRR. No JVD, rubs, gallops or clicks. No pedal edema. Gastrointestinal system: Abdomen is nondistended, soft and nontender. No organomegaly Central nervous system: Alert and oriented. No focal neurological deficits. Extremities: Symmetric 5 x 5 power.  Data Reviewed: I  have personally reviewed following labs and imaging studies  CBC:  Recent Labs Lab 12/23/15 1340  WBC 15.6*  HGB 15.8*  HCT 46.4*  MCV 94.3  PLT 354   Basic Metabolic Panel:  Recent Labs Lab 12/23/15 1340  NA 136  K 4.8  CL 100*  CO2 25  GLUCOSE 119*  BUN 20  CREATININE 1.44*  CALCIUM 9.8   Coagulation Profile:  Recent Labs Lab 12/23/15 1506 12/24/15 0145  INR 2.54 2.13   Thyroid Function Tests:  Recent Labs  12/24/15 0145  TSH 2.022   Urine analysis:    Component Value Date/Time   COLORURINE YELLOW 12/09/2015 [place] 12/09/2015 1042   LABSPEC 1.017 12/09/2015 1042   PHURINE 5.5 12/09/2015 1042   GLUCOSEU NEGATIVE 12/09/2015 1042   HGBUR NEGATIVE 12/09/2015 1042   BILIRUBINUR NEGATIVE 12/09/2015  1042   KETONESUR NEGATIVE 12/09/2015 1042   PROTEINUR NEGATIVE 12/09/2015 1042   UROBILINOGEN 0.2 05/08/2014 1232   NITRITE NEGATIVE 12/09/2015 1042   LEUKOCYTESUR TRACE (A) 12/09/2015 1042   Recent Results (from the past 240 hour(s))  MRSA PCR Screening     Status: None   Collection Time: 12/23/15  9:00 PM  Result Value Ref Range Status   MRSA by PCR NEGATIVE NEGATIVE Final    Comment:        The GeneXpert MRSA Assay (FDA approved for NASAL specimens only), is one component of a comprehensive MRSA colonization surveillance program. It is not intended to diagnose MRSA infection nor to guide or monitor treatment for MRSA infections.       Radiology Studies: Nm Pulmonary Perf And Vent  Result Date: 12/23/2015 CLINICAL DATA:  Shortness of breath, AFib EXAM: NUCLEAR MEDICINE VENTILATION - PERFUSION LUNG SCAN TECHNIQUE: Ventilation images were obtained in multiple projections using inhaled aerosol Tc-18m DTPA. Perfusion images were obtained in multiple projections after intravenous injection of Tc-32m MAA. RADIOPHARMACEUTICALS:  31 mCi Technetium-39m DTPA aerosol inhalation and 3.2 mCi Technetium-1m MAA IV COMPARISON:  Chest x-ray 12/23/2015 FINDINGS: Ventilation: Heterogenous bilateral ventilation. Digestive tract activity is noted. Perfusion: No wedge shaped peripheral perfusion defects to suggest acute pulmonary embolism. The heart is enlarged. IMPRESSION: Very low probability examination for PE. Electronically Signed   By: Donavan Foil M.D.   On: 12/23/2015 21:02   Dg Chest Port 1 View  Result Date: 12/24/2015 CLINICAL DATA:  [place].  Chest pain and leukocytosis EXAM: PORTABLE CHEST 1 VIEW COMPARISON:  Yesterday FINDINGS: Unchanged cardiopericardial enlargement and aortic tortuosity with vascular pedicle widening. Cephalized blood flow and diffuse interstitial opacity. There is asymmetric opacity at the right base, persisting from yesterday. No effusion or pneumothorax. IMPRESSION:  1. Cardiomegaly and mild pulmonary edema. 2. Possible superimposed pneumonia at the right base. Electronically Signed   By: Monte Fantasia M.D.   On: 12/24/2015 13:17   Dg Chest Port 1 View  Result Date: 12/23/2015 CLINICAL DATA:  Right-sided chest pain and increasing shortness of breath EXAM: PORTABLE CHEST 1 VIEW COMPARISON:  Chest radiograph 12/23/2015 at 2:37 p.m. FINDINGS: Unchanged cardiomegaly with aortic arch atherosclerosis. There are increased opacities in the right lung base, likely pulmonary edema. No other area of consolidation. No pneumothorax or pleural effusion. IMPRESSION: Worsening opacities in the right lung base, likely pulmonary edema. Electronically Signed   By: Ulyses Jarred M.D.   On: 12/23/2015 22:51   Dg Chest Portable 1 View  Result Date: 12/23/2015 CLINICAL DATA:  Arrhythmia. Spitting up blood. Atrial fibrillation. EXAM: PORTABLE CHEST 1 VIEW COMPARISON:  03/18/2014 FINDINGS: Cardiac silhouette is enlarged. Atherosclerotic calcifications at the aortic arch. Lungs are essentially clear without pulmonary edema. No large pleural effusions. No acute bone abnormality. IMPRESSION: Cardiomegaly without focal airspace disease or pulmonary edema. Aortic atherosclerosis. Electronically Signed   By: Markus Daft M.D.   On: 12/23/2015 14:46      Scheduled Meds: . allopurinol  300 mg Oral Daily  . aspirin EC  81 mg Oral Daily  . azelastine  2 spray Each Nare BID  . cycloSPORINE  1 drop Both Eyes BID  . digoxin  250 mcg Oral Daily  . diltiazem  120 mg Oral Daily  . erythromycin  1 application Both Eyes QHS  . fluconazole  150 mg Oral Q Wed  . furosemide  40 mg Intravenous BID  . gabapentin  300 mg Oral TID  . levofloxacin (LEVAQUIN) IV  750 mg Intravenous Q24H  . levothyroxine  50 mcg Oral QAC breakfast  . pravastatin  40 mg Oral q1800  . prednisoLONE acetate  1 drop Both Eyes TID  . warfarin  3 mg Oral ONCE-1800  . Warfarin - Pharmacist Dosing Inpatient   Does not apply  q1800   Continuous Infusions:    LOS: 0 days   Time spent: 20 minutes   Faye Ramsay, MD [facility] Pager [phone]  If 7PM-7AM, please contact night-coverage [email or web address] Password [email or web address] 12/24/2015, 4:06 PM

## 2015-12-24 NOTE — Evaluation (Signed)
Physical Therapy Evaluation Patient Details Name: Katie [name] MRN: [id number] DOB: November 20, 1940 Today's Date: 12/24/2015   History of Present Illness  This is a 75 y.o. female with a past medical history significant for permanent atrial fibrillation, systolic and diastolic heart failure with a history of class II symptoms, history of AAA status post repair, mild to moderate aortic stenosis, history of atrial flutter and coronary artery disease. She also has a 50 year smoking history and likely has COPD however it is not described in her medical history. 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 She presents with dyspnea and tachycardia and over the past week her symptoms have worsened significantly. Today she could barely walk across the room without being short of breath. She was found to be in A. fib with RVR with heart rate in the 140s.   Clinical Impression  Pt admitted with above diagnosis. Pt currently with functional limitations due to the deficits listed below (see PT Problem List). Pt with limited mobility today as she is having frequent urination and did not want to walk from bed.  Pt appears like she will progress well and may need HHPT.   Pt will benefit from skilled PT to increase their independence and safety with mobility to allow discharge to the venue listed below.      Follow Up Recommendations Home health PT;Supervision - Intermittent    Equipment Recommendations  None recommended by PT    Recommendations for Other Services       Precautions / Restrictions Precautions Precautions: Fall Restrictions Weight Bearing Restrictions: No      Mobility  Bed Mobility Overal bed mobility: Independent                Transfers Overall transfer level: Needs assistance Equipment used: None Transfers: Sit to/from Stand Sit to Stand: Min guard         General transfer comment: Pt only agreed to stand at EOB as she is having incontinence of urine  and did not want to wet floor. She marched in place with min guard assist.   Ambulation/Gait                Stairs            Wheelchair Mobility    Modified Rankin (Stroke Patients Only)       Balance Overall balance assessment: Needs assistance Sitting-balance support: No upper extremity supported;Feet supported Sitting balance-Leahy Scale: Good     Standing balance support: No upper extremity supported;During functional activity Standing balance-Leahy Scale: Fair Standing balance comment: can stand statically without UE support.                              Pertinent Vitals/Pain Pain Assessment: No/denies pain  VSS on 3LO2 with sats 98%.  Other VSS.     Home Living Family/patient expects to be discharged to:: Private residence Living Arrangements: Spouse/significant other Available Help at Discharge: Family;Available PRN/intermittently Type of Home: House Home Access: Stairs to enter Entrance Stairs-Rails: None Entrance Stairs-Number of Steps: 3 Home Layout: One level Home Equipment: Walker - 4 wheels      Prior Function Level of Independence: Independent         Comments: Was not using equipment PTA.  Was doing chair yoga at [facility] 3 x week.       Hand Dominance        Extremity/Trunk Assessment  Upper Extremity Assessment: Defer to OT evaluation           Lower Extremity Assessment: Generalized weakness      Cervical / Trunk Assessment: Normal  Communication   Communication: No difficulties  Cognition Arousal/Alertness: Awake/alert Behavior During Therapy: WFL for tasks assessed/performed Overall Cognitive Status: Within Functional Limits for tasks assessed                      General Comments      Exercises General Exercises - Lower Extremity Hip Flexion/Marching: AROM;Both;10 reps;Standing   Assessment/Plan    PT Assessment Patient needs continued PT services  PT Problem List Decreased  activity tolerance;Decreased balance;Decreased mobility;Decreased knowledge of use of DME;Decreased safety awareness;Decreased knowledge of precautions          PT Treatment Interventions DME instruction;Gait training;Functional mobility training;Therapeutic activities;Therapeutic exercise;Stair training;Balance training;Patient/family education    PT Goals (Current goals can be found in the Care Plan section)  Acute Rehab PT Goals Patient Stated Goal: to get better PT Goal Formulation: With patient Time For Goal Achievement: 01/07/16 Potential to Achieve Goals: Good    Frequency Min 3X/week   Barriers to discharge        Co-evaluation               End of Session Equipment Utilized During Treatment: Gait belt;Oxygen Activity Tolerance: Patient limited by fatigue Patient left: with call bell/phone within reach;in bed Nurse Communication: Mobility status    Functional Assessment Tool Used: clinical judgment Functional Limitation: Mobility: Walking and moving around Mobility: Walking and Moving Around Current Status (P9150): At least 1 percent but less than 20 percent impaired, limited or restricted Mobility: Walking and Moving Around Goal Status [phone]): 0 percent impaired, limited or restricted    Time: 1224-1238 PT Time Calculation (min) (ACUTE ONLY): 14 min   Charges:   PT Evaluation $PT Eval Moderate Complexity: 1 Procedure     PT G Codes:   PT G-Codes **NOT FOR INPATIENT CLASS** Functional Assessment Tool Used: clinical judgment Functional Limitation: Mobility: Walking and moving around Mobility: Walking and Moving Around Current Status (Y8016): At least 1 percent but less than 20 percent impaired, limited or restricted Mobility: Walking and Moving Around Goal Status [phone]): 0 percent impaired, limited or restricted    Denice Paradise 12/24/2015, 12:57 PM Demarko Zeimet,PT Acute Rehabilitation [phone] (pager)

## 2015-12-24 NOTE — Progress Notes (Signed)
DAILY PROGRESS NOTE  Subjective:  "Pain in my right lung" -breathing marginally improved. Weight and I's/O's were not recorded. HR well-controlled on cardizem. CXR yesterday shows either developing pleural effusion or right base consolidation, where her pain is.  Objective:  Temp:  [97.6 F (36.4 C)-98.3 F (36.8 C)] 98.2 F (36.8 C) (10/11 0813) Pulse Rate:  [42-131] 95 (10/11 0813) Resp:  [18-30] 25 (10/11 0813) BP: (76-166)/(39-83) 137/55 (10/11 0813) SpO2:  [75 %-100 %] 97 % (10/11 0813) FiO2 (%):  [3 %] 3 % (10/10 2300) Weight:  [202 lb 2.6 oz (91.7 kg)] 202 lb 2.6 oz (91.7 kg) (10/10 2100) Weight change:   Intake/Output from previous day: 10/10 0701 - 10/11 0700 In: 204.2 [P.O.:120; I.V.:84.2] Out: 350 [Urine:350]  Intake/Output from this shift: Total I/O In: 5 [I.V.:5] Out: -   Medications: No current facility-administered medications on file prior to encounter.    Current Outpatient Prescriptions on File Prior to Encounter  Medication Sig Dispense Refill  . albuterol (PROVENTIL HFA;VENTOLIN HFA) 108 (90 BASE) MCG/ACT inhaler 2 puffs 5 minutes apart every 4 to 6 hours as needed to rescue asthma (Patient taking differently: Inhale 2 puffs into the lungs 5 minutes apart every 4 to 6 hours as needed to rescue asthma) 1 Inhaler 11  . allopurinol (ZYLOPRIM) 300 MG tablet TAKE 1 TABLET BY MOUTH EVERY DAY 90 tablet 1  . aspirin 81 MG tablet Take 81 mg by mouth daily.      Marland Kitchen azelastine (ASTELIN) 0.1 % nasal spray Place 2 sprays into both nostrils 2 (two) times daily. Use in each nostril as directed 30 mL 2  . Cetirizine HCl 10 MG CAPS Take 1 capsule (10 mg total) by mouth at bedtime. 90 capsule 2  . furosemide (LASIX) 80 MG tablet TAKE 1 TABLET BY MOUTH TWICE A DAY AS NEEDED FOR FLUID (Patient taking differently: Take 40 mg by mouth daily. TAKE 1 TABLET BY MOUTH TWICE A DAY AS NEEDED FOR FLUID) 180 tablet 1  . gabapentin (NEURONTIN) 300 MG capsule TAKE ONE CAPSULE BY  MOUTH 3 TIMES A DAY 270 capsule 1  . montelukast (SINGULAIR) 10 MG tablet TAKE 1 TABLET EVERY DAY FOR ALLERGIES 90 tablet 1  . pravastatin (PRAVACHOL) 40 MG tablet TAKE 1 TABLET BY MOUTH AT BEDTIME FOR CHOLESTEROL 90 tablet 1  . prochlorperazine (COMPAZINE) 5 MG tablet TAKE 1 TABLET BY MOUTH 3 TIMES A DAY FOR VERTIGO OR NAUSEA 90 tablet 1  . benazepril (LOTENSIN) 20 MG tablet Take 20 mg by mouth daily.    . bisoprolol-hydrochlorothiazide (ZIAC) 5-6.25 MG tablet Take 1 tablet by mouth daily. 90 tablet 3  . digoxin (LANOXIN) 0.25 MG tablet TAKE 1 TABLET BY MOUTH DAILY 90 tablet 3  . ipratropium (ATROVENT) 0.02 % nebulizer solution Take 0.5 mg by nebulization every 4 (four) hours as needed for wheezing or shortness of breath.     Marland Kitchen ipratropium-albuterol (DUONEB) 0.5-2.5 (3) MG/3ML SOLN Use 3 cc  4 x day or every 4 hours if needed for shortness of breath. 360 mL 99  . meclizine (ANTIVERT) 25 MG tablet Take 1 tablet (25 mg total) by mouth 3 (three) times daily as needed for dizziness. 60 tablet 1  . metolazone (ZAROXOLYN) 2.5 MG tablet Take 1 tablet (2.5 mg total) by mouth daily. 10 tablet 0  . potassium chloride SA (K-DUR,KLOR-CON) 20 MEQ tablet Take 2 tablets (40 mEq total) by mouth daily. 30 tablet 0  . warfarin (COUMADIN) 2 MG tablet  TAKE 1 TO 2 TABLETS BY MOUTH DAILY OR AS DIRECTED 180 tablet 3    Physical Exam: General appearance: alert and no distress Neck: no carotid bruit and no JVD Lungs: diminished breath sounds bilaterally, dullness to percussion RLL and rales RLL Heart: irregularly irregular rhythm Extremities: extremities normal, atraumatic, no cyanosis or edema Pulses: 2+ and symmetric Neurologic: Grossly normal  Lab Results: Results for orders placed or performed during the hospital encounter of 12/23/15 (from the past 48 hour(s))  Basic metabolic panel     Status: Abnormal   Collection Time: 12/23/15  1:40 PM  Result Value Ref Range   Sodium 136 135 - 145 mmol/L   Potassium  4.8 3.5 - 5.1 mmol/L    Comment: HEMOLYSIS AT THIS LEVEL MAY AFFECT RESULT   Chloride 100 (L) 101 - 111 mmol/L   CO2 25 22 - 32 mmol/L   Glucose, Bld 119 (H) 65 - 99 mg/dL   BUN 20 6 - 20 mg/dL   Creatinine, Ser 1.44 (H) 0.44 - 1.00 mg/dL   Calcium 9.8 8.9 - 10.3 mg/dL   GFR calc non Af Amer 35 (L) >60 mL/min   GFR calc Af Amer 41 (L) >60 mL/min    Comment: (NOTE) The eGFR has been calculated using the CKD EPI equation. This calculation has not been validated in all clinical situations. eGFR's persistently <60 mL/min signify possible Chronic Kidney Disease.    Anion gap 11 5 - 15  CBC     Status: Abnormal   Collection Time: 12/23/15  1:40 PM  Result Value Ref Range   WBC 15.6 (H) 4.0 - 10.5 K/uL   RBC 4.92 3.87 - 5.11 MIL/uL   Hemoglobin 15.8 (H) 12.0 - 15.0 g/dL   HCT 46.4 (H) 36.0 - 46.0 %   MCV 94.3 78.0 - 100.0 fL   MCH 32.1 26.0 - 34.0 pg   MCHC 34.1 30.0 - 36.0 g/dL   RDW 14.7 11.5 - 15.5 %   Platelets 175 150 - 400 K/uL  Digoxin level     Status: None   Collection Time: 12/23/15  1:40 PM  Result Value Ref Range   Digoxin Level 0.8 0.8 - 2.0 ng/mL  I-stat troponin, ED     Status: None   Collection Time: 12/23/15  2:03 PM  Result Value Ref Range   Troponin i, poc 0.01 0.00 - 0.08 ng/mL   Comment 3            Comment: Due to the release kinetics of cTnI, a negative result within the first hours of the onset of symptoms does not rule out myocardial infarction with certainty. If myocardial infarction is still suspected, repeat the test at appropriate intervals.   Brain natriuretic peptide     Status: Abnormal   Collection Time: 12/23/15  3:06 PM  Result Value Ref Range   B Natriuretic Peptide 282.2 (H) 0.0 - 100.0 pg/mL  Protime-INR     Status: Abnormal   Collection Time: 12/23/15  3:06 PM  Result Value Ref Range   Prothrombin Time 27.8 (H) 11.4 - 15.2 seconds   INR 2.54   I-Stat Troponin, ED (not at [facility])     Status: None   Collection Time: 12/23/15  5:41 PM    Result Value Ref Range   Troponin i, poc 0.03 0.00 - 0.08 ng/mL   Comment 3            Comment: Due to the release kinetics of cTnI, a negative  result within the first hours of the onset of symptoms does not rule out myocardial infarction with certainty. If myocardial infarction is still suspected, repeat the test at appropriate intervals.   MRSA PCR Screening     Status: None   Collection Time: 12/23/15  9:00 PM  Result Value Ref Range   MRSA by PCR NEGATIVE NEGATIVE    Comment:        The GeneXpert MRSA Assay (FDA approved for NASAL specimens only), is one component of a comprehensive MRSA colonization surveillance program. It is not intended to diagnose MRSA infection nor to guide or monitor treatment for MRSA infections.   TSH     Status: None   Collection Time: 12/24/15  1:45 AM  Result Value Ref Range   TSH 2.022 0.350 - 4.500 uIU/mL    Comment: Performed by a 3rd Generation assay with a functional sensitivity of <=0.01 uIU/mL.  Protime-INR     Status: Abnormal   Collection Time: 12/24/15  1:45 AM  Result Value Ref Range   Prothrombin Time 24.2 (H) 11.4 - 15.2 seconds   INR 2.13     Imaging: Nm Pulmonary Perf And Vent  Result Date: 12/23/2015 CLINICAL DATA:  Shortness of breath, AFib EXAM: NUCLEAR MEDICINE VENTILATION - PERFUSION LUNG SCAN TECHNIQUE: Ventilation images were obtained in multiple projections using inhaled aerosol Tc-76mDTPA. Perfusion images were obtained in multiple projections after intravenous injection of Tc-933mAA. RADIOPHARMACEUTICALS:  31 mCi Technetium-9966mPA aerosol inhalation and 3.2 mCi Technetium-69m42m IV COMPARISON:  Chest x-ray 12/23/2015 FINDINGS: Ventilation: Heterogenous bilateral ventilation. Digestive tract activity is noted. Perfusion: No wedge shaped peripheral perfusion defects to suggest acute pulmonary embolism. The heart is enlarged. IMPRESSION: Very low probability examination for PE. Electronically Signed   By: Kim Donavan Foil.   On: 12/23/2015 21:02   Dg Chest Port 1 View  Result Date: 12/23/2015 CLINICAL DATA:  Right-sided chest pain and increasing shortness of breath EXAM: PORTABLE CHEST 1 VIEW COMPARISON:  Chest radiograph 12/23/2015 at 2:37 p.m. FINDINGS: Unchanged cardiomegaly with aortic arch atherosclerosis. There are increased opacities in the right lung base, likely pulmonary edema. No other area of consolidation. No pneumothorax or pleural effusion. IMPRESSION: Worsening opacities in the right lung base, likely pulmonary edema. Electronically Signed   By: KeviUlyses Jarred.   On: 12/23/2015 22:51   Dg Chest Portable 1 View  Result Date: 12/23/2015 CLINICAL DATA:  Arrhythmia. Spitting up blood. Atrial fibrillation. EXAM: PORTABLE CHEST 1 VIEW COMPARISON:  03/18/2014 FINDINGS: Cardiac silhouette is enlarged. Atherosclerotic calcifications at the aortic arch. Lungs are essentially clear without pulmonary edema. No large pleural effusions. No acute bone abnormality. IMPRESSION: Cardiomegaly without focal airspace disease or pulmonary edema. Aortic atherosclerosis. Electronically Signed   By: AdamMarkus Daft.   On: 12/23/2015 14:46    Assessment:  1. Active Problems: 2.   Essential hypertension 3.   Congestive heart failure (HCC)[place]   COPD (chronic obstructive pulmonary disease) with chronic bronchitis (HCC)[place]   GERD 6.   Hypothyroidism 7.   Long term current use of anticoagulant therapy 8.   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   Hyperlipidemia 10.   Atrial fibrillation with rapid ventricular response (HCC)[place].   Plan:  1. Follow daily weights- strict I's/O's. Continue IV lasix. Repeat CXR today to eval for possible developing pneumonia. BNP only mildly elevated at 282. WBC count rising yesterday from 7 -> 15K. Low threshold to start antibiotics, as hemoptysis could  be related to pulmonary edema or pneumonia. Stop cardizem infusion and switch to cardizem LA 120 mg daily.  Time Spent Directly  with Patient:  15 minutes  Length of Stay:  LOS: 0 days   Pixie Casino, MD, [facility] Attending Cardiologist [place] 12/24/2015, 11:06 AM

## 2015-12-24 NOTE — Progress Notes (Signed)
Pt requested to only take half of ambien pill. 2.5 mg (1/2 pill) wasted in sharps with Cherlynn Perches., RN.

## 2015-12-24 NOTE — Progress Notes (Signed)
  Echocardiogram 2D Echocardiogram has been performed.  Katie [name] 12/24/2015, 3:38 PM

## 2015-12-24 NOTE — Progress Notes (Addendum)
[place] for coumadin Indication: atrial fibrillation  Allergies  Allergen Reactions  . Advair Diskus [Fluticasone-Salmeterol] Other (See Comments)    SKIN CHANGES.  Can tolerate low dose of prednisone without complications  . Amiodarone Other (See Comments)    PULMONARY TOXICITY  . Codeine     unknown  . Diovan [Valsartan] Other (See Comments)    HYPOTENSION  . Doxycycline Diarrhea and Other (See Comments)    VISUAL DISTURBANCE  . Flexeril [Cyclobenzaprine] Other (See Comments)    FATIGUE  . Keflex [Cephalexin] Diarrhea  . Verapamil Other (See Comments)    EDEMA    Patient Measurements: Height: 5\' 3"  (160 cm) Weight: 202 lb 2.6 oz (91.7 kg) IBW/kg (Calculated) : 52.4  Vital Signs: Temp: 98.2 F (36.8 C) (10/11 0813) Temp Source: Oral (10/11 0813) BP: 137/55 (10/11 0813) Pulse Rate: 95 (10/11 0813)  Labs:  Recent Labs  12/23/15 1340 12/23/15 1506 12/24/15 0145  HGB 15.8*  --   --   HCT 46.4*  --   --   PLT 175  --   --   LABPROT  --  27.8* 24.2*  INR  --  2.54 2.13  CREATININE 1.44*  --   --     Estimated Creatinine Clearance: 37.4 mL/min (by C-G formula based on SCr of 1.44 mg/dL (H)).   Medical History: Past Medical History:  Diagnosis Date  . Aortic stenosis    mild AS witm mild to mod AR by 11/2012 echo  . Arthritis   . Atrial fibrillation ([place])   . Atrial flutter ([place])   . Bronchitis, chronic ([place])   . Cervical dysplasia   . Coronary artery disease   . Cystocele    either on bladder or kidneys patient is unsure but stated physician will watch it  . Dizziness    If patient gets out of bed too fast  . Family history of anesthesia complication    Hard for daughter to wake up after Anesthesia  . Gout   . H/O hiatal hernia   . History of Bell's palsy   . Migraine headache   . Osteopenia   . Rosacea conjunctivitis    Right eye is worse than left eye  . Seasonal allergies   . Shortness of breath    with  exertion  . Thyroid disease    Hypo  . Tobacco abuse   . Unspecified vitamin D deficiency     Assessment: 78 YOF on coumadin PTA for afib to continue inpatient per Pharmacy. INR on admission therapeutic at 2.54. Last dose 10/10 PTA. Per conversation with med rec, pt seems confused about the medications she is taking. Claims she only uses [facility] but they have no record of any recent coumadin Rx system-wide. Appointment note from Dr. Melford Aase on 9/12 shows coumadin dose changed to 2mg  daily. Will use as a starting point for dosing. V/Q scan shows very low probability of PE. Noted on digoxin and fluconazole.  INR down to 2.13 this AM. CBC wnl. No bleeding reported.   Goal of Therapy:  INR 2-3 Monitor platelets by anticoagulation protocol: Yes   Plan:  -Coumadin 3mg  x 1 dose tonight -Daily INR -Monitor CBC, s/sx bleeding   Katie [name], PharmD, BCPS Clinical Pharmacist 12/24/2015 10:18 AM

## 2015-12-25 ENCOUNTER — Inpatient Hospital Stay (HOSPITAL_COMMUNITY): Payer: Medicare Other

## 2015-12-25 DIAGNOSIS — J189 Pneumonia, unspecified organism: Secondary | ICD-10-CM

## 2015-12-25 DIAGNOSIS — A419 Sepsis, unspecified organism: Principal | ICD-10-CM

## 2015-12-25 DIAGNOSIS — I5031 Acute diastolic (congestive) heart failure: Secondary | ICD-10-CM

## 2015-12-25 LAB — CBC
HCT: 40.9 % (ref 36.0–46.0)
Hemoglobin: 13.5 g/dL (ref 12.0–15.0)
MCH: 31.5 pg (ref 26.0–34.0)
MCHC: 33 g/dL (ref 30.0–36.0)
MCV: 95.6 fL (ref 78.0–100.0)
PLATELETS: 179 10*3/uL (ref 150–400)
RBC: 4.28 MIL/uL (ref 3.87–5.11)
RDW: 15.2 % (ref 11.5–15.5)
WBC: 17.3 10*3/uL — ABNORMAL HIGH (ref 4.0–10.5)

## 2015-12-25 LAB — BASIC METABOLIC PANEL
Anion gap: 8 (ref 5–15)
BUN: 23 mg/dL — AB (ref 6–20)
CO2: 27 mmol/L (ref 22–32)
CREATININE: 1.53 mg/dL — AB (ref 0.44–1.00)
Calcium: 9.3 mg/dL (ref 8.9–10.3)
Chloride: 99 mmol/L — ABNORMAL LOW (ref 101–111)
GFR calc Af Amer: 38 mL/min — ABNORMAL LOW (ref >60)
GFR, EST NON AFRICAN AMERICAN: 33 mL/min — AB (ref >60)
GLUCOSE: 161 mg/dL — AB (ref 65–99)
POTASSIUM: 4.5 mmol/L (ref 3.5–5.1)
SODIUM: 134 mmol/L — AB (ref 135–145)

## 2015-12-25 LAB — GLUCOSE, CAPILLARY: GLUCOSE-CAPILLARY: 145 mg/dL — AB (ref 65–99)

## 2015-12-25 LAB — PROTIME-INR
INR: 1.69
PROTHROMBIN TIME: 20.1 s — AB (ref 11.4–15.2)

## 2015-12-25 MED ORDER — FUROSEMIDE 40 MG PO TABS
40.0000 mg | ORAL_TABLET | Freq: Every day | ORAL | Status: DC
  Administered 2015-12-25 – 2015-12-30 (×6): 40 mg via ORAL
  Filled 2015-12-25: qty 1
  Filled 2015-12-25: qty 2
  Filled 2015-12-25 (×4): qty 1

## 2015-12-25 MED ORDER — WARFARIN SODIUM 5 MG PO TABS
5.0000 mg | ORAL_TABLET | Freq: Once | ORAL | Status: AC
  Administered 2015-12-25: 5 mg via ORAL
  Filled 2015-12-25: qty 1

## 2015-12-25 MED ORDER — LEVALBUTEROL HCL 1.25 MG/0.5ML IN NEBU
1.2500 mg | INHALATION_SOLUTION | Freq: Three times a day (TID) | RESPIRATORY_TRACT | Status: DC
  Administered 2015-12-25 – 2015-12-26 (×5): 1.25 mg via RESPIRATORY_TRACT
  Filled 2015-12-25 (×5): qty 0.5

## 2015-12-25 MED ORDER — GUAIFENESIN-DM 100-10 MG/5ML PO SYRP
5.0000 mL | ORAL_SOLUTION | ORAL | Status: DC | PRN
  Administered 2015-12-27 – 2015-12-29 (×9): 5 mL via ORAL
  Filled 2015-12-25 (×9): qty 5

## 2015-12-25 MED ORDER — METHYLPREDNISOLONE SODIUM SUCC 40 MG IJ SOLR
40.0000 mg | Freq: Two times a day (BID) | INTRAMUSCULAR | Status: DC
  Administered 2015-12-25 – 2015-12-26 (×3): 40 mg via INTRAVENOUS
  Filled 2015-12-25 (×4): qty 1

## 2015-12-25 MED ORDER — GUAIFENESIN ER 600 MG PO TB12
600.0000 mg | ORAL_TABLET | Freq: Two times a day (BID) | ORAL | Status: DC
  Administered 2015-12-25 – 2015-12-30 (×11): 600 mg via ORAL
  Filled 2015-12-25 (×11): qty 1

## 2015-12-25 NOTE — Progress Notes (Signed)
Physical Therapy Treatment Patient Details Name: Katie [name] MRN: [id number] DOB: 25-Jun-1940 Today's Date: 12/25/2015    History of Present Illness This is a 75 y.o. female with a past medical history significant for permanent atrial fibrillation, systolic and diastolic heart failure with a history of class II symptoms, history of AAA status post repair, mild to moderate aortic stenosis, history of atrial flutter and coronary artery disease. She also has a 50 year smoking history and likely has COPD however it is not described in her medical history. 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 She presents with dyspnea and tachycardia and over the past week her symptoms have worsened significantly. Today she could barely walk across the room without being short of breath. She was found to be in A. fib with RVR with heart rate in the 140s.     PT Comments    Pt admitted with above diagnosis. Pt currently with functional limitations due to balance and endurance deficits. Pt able to ambulate in hallway but needs min to min guard assist.  Would do well with RW and has one at home.  Pt ambulated without O2 and sats >89% throughout walk on RA.  Replaced O2 once back in room as it was on on arrival.  Will continue.  Pt will benefit from skilled PT to increase their independence and safety with mobility to allow discharge to the venue listed below.    Follow Up Recommendations  Home health PT;Supervision - Intermittent     Equipment Recommendations  None recommended by PT    Recommendations for Other Services       Precautions / Restrictions Precautions Precautions: Fall Restrictions Weight Bearing Restrictions: No    Mobility  Bed Mobility Overal bed mobility: Independent                Transfers Overall transfer level: Needs assistance Equipment used: None;1 person hand held assist Transfers: Sit to/from Stand Sit to Stand: Min guard         General  transfer comment: Steadying assist given.  Ambulation/Gait Ambulation/Gait assistance: Min guard;Min assist Ambulation Distance (Feet): 200 Feet Assistive device: Rolling walker (2 wheeled) Gait Pattern/deviations: Step-through pattern;Decreased stride length;Drifts right/left   Gait velocity interpretation: Below normal speed for age/gender General Gait Details: Pt ambulated needing steadying assist at times with HHA of 1 majority of time as pt with guarded gait.  Pt encouraged to use RW at home initially for safety as she is slightly weak.  Pt agrees.  No largeLOB but pt just unsteady at times if not having support.    Stairs            Wheelchair Mobility    Modified Rankin (Stroke Patients Only)       Balance Overall balance assessment: Needs assistance Sitting-balance support: No upper extremity supported;Feet supported Sitting balance-Leahy Scale: Good     Standing balance support: Single extremity supported;During functional activity Standing balance-Leahy Scale: Fair Standing balance comment: can stand statically for up to a minute without UE support.              High level balance activites: Sudden stops;Head turns;Turns;Direction changes High Level Balance Comments: Needs min guard assist with challenges     Cognition Arousal/Alertness: Awake/alert Behavior During Therapy: WFL for tasks assessed/performed Overall Cognitive Status: Within Functional Limits for tasks assessed                      Exercises  General Comments        Pertinent Vitals/Pain Pain Assessment: No/denies pain  VSS overall.     Home Living                      Prior Function            PT Goals (current goals can now be found in the care plan section) Progress towards PT goals: Progressing toward goals    Frequency    Min 3X/week      PT Plan Current plan remains appropriate    Co-evaluation             End of Session  Equipment Utilized During Treatment: Gait belt;Oxygen Activity Tolerance: Patient limited by fatigue Patient left: with call bell/phone within reach;in bed     Time: 1210-1224 PT Time Calculation (min) (ACUTE ONLY): 14 min  Charges:  $Gait Training: 8-22 mins                    G Codes:      WhiteGodfrey Pick 01/04/16, 1:58 PM  [employer] Acute Rehabilitation [phone] (pager)

## 2015-12-25 NOTE — Progress Notes (Signed)
[place] for coumadin Indication: atrial fibrillation  Allergies  Allergen Reactions  . Advair Diskus [Fluticasone-Salmeterol] Other (See Comments)    SKIN CHANGES.  Can tolerate low dose of prednisone without complications  . Amiodarone Other (See Comments)    PULMONARY TOXICITY  . Codeine     unknown  . Diovan [Valsartan] Other (See Comments)    HYPOTENSION  . Doxycycline Diarrhea and Other (See Comments)    VISUAL DISTURBANCE  . Flexeril [Cyclobenzaprine] Other (See Comments)    FATIGUE  . Keflex [Cephalexin] Diarrhea  . Verapamil Other (See Comments)    EDEMA    Patient Measurements: Height: 5\' 3"  (160 cm) Weight: 198 lb 10.2 oz (90.1 kg) IBW/kg (Calculated) : 52.4  Vital Signs: Temp: 97.4 F (36.3 C) (10/12 0752) Temp Source: Oral (10/12 0752) BP: 112/67 (10/12 0752) Pulse Rate: 50 (10/12 0752)  Labs:  Recent Labs  12/23/15 1340 12/23/15 1506 12/24/15 0145 12/24/15 1846 12/25/15 0152  HGB 15.8*  --   --  13.9 13.5  HCT 46.4*  --   --  41.4 40.9  PLT 175  --   --  167 179  LABPROT  --  27.8* 24.2*  --  20.1*  INR  --  2.54 2.13  --  1.69  CREATININE 1.44*  --   --   --  1.53*    Estimated Creatinine Clearance: 34.9 mL/min (by C-G formula based on SCr of 1.53 mg/dL (H)).   Medical History: Past Medical History:  Diagnosis Date  . Aortic stenosis    mild AS witm mild to mod AR by 11/2012 echo  . Arthritis   . Atrial fibrillation ([place])   . Atrial flutter ([place])   . Bronchitis, chronic ([place])   . Cervical dysplasia   . Coronary artery disease   . Cystocele    either on bladder or kidneys patient is unsure but stated physician will watch it  . Dizziness    If patient gets out of bed too fast  . Family history of anesthesia complication    Hard for daughter to wake up after Anesthesia  . Gout   . H/O hiatal hernia   . History of Bell's palsy   . Migraine headache   . Osteopenia   . Rosacea conjunctivitis     Right eye is worse than left eye  . Seasonal allergies   . Shortness of breath    with exertion  . Thyroid disease    Hypo  . Tobacco abuse   . Unspecified vitamin D deficiency     Assessment: 16 YOF on coumadin PTA for afib to continue inpatient per Pharmacy. INR on admission therapeutic at 2.54. Last dose 10/10 PTA. Per conversation with med rec, pt seems confused about the medications she is taking. Claims she only uses [facility] but they have no record of any recent coumadin Rx system-wide. Appointment note from Dr. Melford Aase on 9/12 shows coumadin dose changed to 2mg  daily. Will use as a starting point for dosing. V/Q scan shows very low probability of PE. Noted on digoxin, levaquin, and fluconazole.  INR down to 1.69 this AM after 1 dose inpatient. CBC wnl. No bleeding reported.   Goal of Therapy:  INR 2-3 Monitor platelets by anticoagulation protocol: Yes   Plan:  -Coumadin 5mg  x 1 dose tonight -Daily INR -Monitor CBC, s/sx bleeding   Elicia Lamp, PharmD, BCPS Clinical Pharmacist 12/25/2015 8:45 AM

## 2015-12-25 NOTE — Progress Notes (Signed)
Patient ID: Katie [name], female   DOB: 28-Jun-1940, 75 y.o.   MRN: [id number]    PROGRESS NOTE    Katie [name]  NWG:[id number] DOB: 29-Jan-1941 DOA: 12/23/2015  PCP: [name], MD   Brief Narrative:  75 y.o. female, with past medical history of COPD, hypertension, atrial fibrillation on  Warfarin,nonischemic cardiomyopathy, nonobstructive coronary artery disease patient presents with complaints of palpitation and shortness of breath this a.m.reports  Has been having progressive dyspnea over few month, but then significantly worsened over last week.  Assessment & Plan:   Permanent A. fib (CHADSVASC 5) with rapid ventricular response - currently on cardizem for rate control  - also on digoxin and coumadin  - appreciate cardiology team assistance, keep on cardiac monitor for now - HR has been controlled in 70 - 80's   Acute hypoxic respiratory failure secondary to likely COPD exacerbation and RLL PNA - unknown pathogen at this time  - pt placed on Levaquin IV, continue day #2 - follow up on blood and sputum cultures - treat COPD with bronchodilators and steroids with planned tapering - keep on oxygen via [place] to keep O2 sats > 90% - since pt had some mild hemoptysis this AM, will proceed with CT chest for clearer evaluation   Sepsis secondary to RLL PNA - pt did meet criteria for sepsis on admission with WBC 15K, RR > 20 and HR > 90 bpm - pneumonia order set placed which essentialy includes ABX, blood and sputum cultures  - will continue Levaquin day #2, WBC is trending down, no fevers this AM - CBC in AM  Acute kidney injury - due to the above, slight rise in Cr overnight - encouraged oral intake  - also lasix was given IV and now transitioned to oral Lasix per cardiology  - BMP in AM  Acute on Chronic combined systolic and diastolic congestive heart failure - weight 202 lbs on admission --> 199 --> 198 lbs this AM - transitioned to oral Lasix today - continue to  monitor daily weights, strict I/O  Obesity  - Body mass index is 35.81 kg/m.  DVT prophylaxis: Coumadin  Code Status: Full  Family Communication: Patient and family at bedside  Disposition Plan: Keep in SDU  Consultants:   Cardiology   Procedures:   None  Antimicrobials:   Levaquin 10/11 -->   Subjective: Still with exertional dyspnea and some hemoptysis, overall better.   Objective: Vitals:   12/25/15 0643 12/25/15 0752 12/25/15 0913 12/25/15 1149  BP:  112/67  111/60  Pulse:  (!) 50 75 75  Resp:  19  20  Temp:  97.4 F (36.3 C)  97.4 F (36.3 C)  TempSrc:  Oral  Oral  SpO2:  94%  94%  Weight: 90.1 kg (198 lb 10.2 oz)     Height:        Intake/Output Summary (Last 24 hours) at 12/25/15 1245 Last data filed at 12/25/15 1151  Gross per 24 hour  Intake              150 ml  Output              950 ml  Net             -800 ml   Filed Weights   12/23/15 2100 12/24/15 1622 12/25/15 0643  Weight: 91.7 kg (202 lb 2.6 oz) 90.6 kg (199 lb 11.8 oz) 90.1 kg (198 lb 10.2 oz)    Examination:  General  exam: Appears in mild distress due to pain   Respiratory system: rhonchi at bases, still diminished breath sounds at bases, mild exp wheezing  Cardiovascular system: IRRRR. No JVD, rubs, gallops or clicks. No pedal edema. Gastrointestinal system: Abdomen is nondistended, soft and nontender. No organomegaly Central nervous system: Alert and oriented. No focal neurological deficits. Extremities: Symmetric 5 x 5 power.  Data Reviewed: I have personally reviewed following labs and imaging studies  CBC:  Recent Labs Lab 12/23/15 1340 12/24/15 1846 12/25/15 0152  WBC 15.6* 20.5* 17.3*  HGB 15.8* 13.9 13.5  HCT 46.4* 41.4 40.9  MCV 94.3 94.7 95.6  PLT 175 167 607   Basic Metabolic Panel:  Recent Labs Lab 12/23/15 1340 12/25/15 0152  NA 136 134*  K 4.8 4.5  CL 100* 99*  CO2 25 27  GLUCOSE 119* 161*  BUN 20 23*  CREATININE 1.44* 1.53*  CALCIUM 9.8 9.3     Coagulation Profile:  Recent Labs Lab 12/23/15 1506 12/24/15 0145 12/25/15 0152  INR 2.54 2.13 1.69   Thyroid Function Tests:  Recent Labs  12/24/15 0145  TSH 2.022   Recent Results (from the past 240 hour(s))  MRSA PCR Screening     Status: None   Collection Time: 12/23/15  9:00 PM  Result Value Ref Range Status   MRSA by PCR NEGATIVE NEGATIVE Final    Comment:        The GeneXpert MRSA Assay (FDA approved for NASAL specimens only), is one component of a comprehensive MRSA colonization surveillance program. It is not intended to diagnose MRSA infection nor to guide or monitor treatment for MRSA infections.   Culture, sputum-assessment     Status: None   Collection Time: 12/24/15  6:11 PM  Result Value Ref Range Status   Specimen Description SPUTUM  Final   Special Requests NONE  Final   Sputum evaluation   Final    THIS SPECIMEN IS ACCEPTABLE. RESPIRATORY CULTURE REPORT TO FOLLOW.   Report Status 12/24/2015 FINAL  Final  Culture, respiratory (NON-Expectorated)     Status: None (Preliminary result)   Collection Time: 12/24/15  6:11 PM  Result Value Ref Range Status   Specimen Description SPUTUM  Final   Special Requests NONE  Final   Gram Stain   Final    ABUNDANT WBC PRESENT,BOTH PMN AND MONONUCLEAR RARE GRAM POSITIVE COCCI IN PAIRS RARE GRAM NEGATIVE RODS    Culture TOO YOUNG TO READ  Final   Report Status PENDING  Incomplete      Radiology Studies: Nm Pulmonary Perf And Vent  Result Date: 12/23/2015 CLINICAL DATA:  Shortness of breath, AFib EXAM: NUCLEAR MEDICINE VENTILATION - PERFUSION LUNG SCAN TECHNIQUE: Ventilation images were obtained in multiple projections using inhaled aerosol Tc-31m DTPA. Perfusion images were obtained in multiple projections after intravenous injection of Tc-81m MAA. RADIOPHARMACEUTICALS:  31 mCi Technetium-63m DTPA aerosol inhalation and 3.2 mCi Technetium-47m MAA IV COMPARISON:  Chest x-ray 12/23/2015 FINDINGS:  Ventilation: Heterogenous bilateral ventilation. Digestive tract activity is noted. Perfusion: No wedge shaped peripheral perfusion defects to suggest acute pulmonary embolism. The heart is enlarged. IMPRESSION: Very low probability examination for PE. Electronically Signed   By: Donavan Foil M.D.   On: 12/23/2015 21:02   Dg Chest Port 1 View  Result Date: 12/24/2015 CLINICAL DATA:  [place].  Chest pain and leukocytosis EXAM: PORTABLE CHEST 1 VIEW COMPARISON:  Yesterday FINDINGS: Unchanged cardiopericardial enlargement and aortic tortuosity with vascular pedicle widening. Cephalized blood flow and diffuse interstitial opacity. There  is asymmetric opacity at the right base, persisting from yesterday. No effusion or pneumothorax. IMPRESSION: 1. Cardiomegaly and mild pulmonary edema. 2. Possible superimposed pneumonia at the right base. Electronically Signed   By: Monte Fantasia M.D.   On: 12/24/2015 13:17   Dg Chest Port 1 View  Result Date: 12/23/2015 CLINICAL DATA:  Right-sided chest pain and increasing shortness of breath EXAM: PORTABLE CHEST 1 VIEW COMPARISON:  Chest radiograph 12/23/2015 at 2:37 p.m. FINDINGS: Unchanged cardiomegaly with aortic arch atherosclerosis. There are increased opacities in the right lung base, likely pulmonary edema. No other area of consolidation. No pneumothorax or pleural effusion. IMPRESSION: Worsening opacities in the right lung base, likely pulmonary edema. Electronically Signed   By: Ulyses Jarred M.D.   On: 12/23/2015 22:51   Dg Chest Portable 1 View  Result Date: 12/23/2015 CLINICAL DATA:  Arrhythmia. Spitting up blood. Atrial fibrillation. EXAM: PORTABLE CHEST 1 VIEW COMPARISON:  03/18/2014 FINDINGS: Cardiac silhouette is enlarged. Atherosclerotic calcifications at the aortic arch. Lungs are essentially clear without pulmonary edema. No large pleural effusions. No acute bone abnormality. IMPRESSION: Cardiomegaly without focal airspace disease or pulmonary edema.  Aortic atherosclerosis. Electronically Signed   By: Markus Daft M.D.   On: 12/23/2015 14:46      Scheduled Meds: . allopurinol  300 mg Oral Daily  . aspirin EC  81 mg Oral Daily  . azelastine  2 spray Each Nare BID  . cycloSPORINE  1 drop Both Eyes BID  . digoxin  250 mcg Oral Daily  . diltiazem  120 mg Oral Daily  . erythromycin  1 application Both Eyes QHS  . fluconazole  150 mg Oral Q Wed  . furosemide  40 mg Oral Daily  . gabapentin  300 mg Oral TID  . guaiFENesin  600 mg Oral BID  . levalbuterol  1.25 mg Nebulization Q8H  . levofloxacin (LEVAQUIN) IV  750 mg Intravenous Q48H  . levothyroxine  50 mcg Oral QAC breakfast  . methylPREDNISolone (SOLU-MEDROL) injection  40 mg Intravenous Q12H  . pravastatin  40 mg Oral q1800  . prednisoLONE acetate  1 drop Both Eyes TID  . warfarin  5 mg Oral ONCE-1800  . Warfarin - Pharmacist Dosing Inpatient   Does not apply q1800   Continuous Infusions:    LOS: 1 day   Time spent: 20 minutes   Faye Ramsay, MD [facility] Pager [phone]  If 7PM-7AM, please contact night-coverage [email or web address] Password [email or web address] 12/25/2015, 12:45 PM

## 2015-12-25 NOTE — Progress Notes (Signed)
DAILY PROGRESS NOTE  Subjective:  Breathing better today. Echo yesterday shows LVEF 60-65% with mild aortic stenosis. CXR yesterday shows mild pulmonary edema and "possible superimposed pneumonia at the right base". Now on IV levaquin. Almost 1L negative since admission. Leukocytosis is improving.  Objective:  Temp:  [97.3 F (36.3 C)-100 F (37.8 C)] 97.4 F (36.3 C) (10/12 1149) Pulse Rate:  [50-109] 75 (10/12 1149) Resp:  [15-28] 20 (10/12 1149) BP: (84-128)/(48-81) 111/60 (10/12 1149) SpO2:  [89 %-100 %] 94 % (10/12 1149) Weight:  [198 lb 10.2 oz (90.1 kg)-199 lb 11.8 oz (90.6 kg)] 198 lb 10.2 oz (90.1 kg) (10/12 0643) Weight change: -2 lb 6.8 oz (-1.1 kg)  Intake/Output from previous day: 10/11 0701 - 10/12 0700 In: 171.8 [I.V.:21.8; IV Piggyback:150] Out: 750 [Urine:750]  Intake/Output from this shift: Total I/O In: -  Out: 200 [Urine:200]  Medications: No current facility-administered medications on file prior to encounter.    Current Outpatient Prescriptions on File Prior to Encounter  Medication Sig Dispense Refill  . albuterol (PROVENTIL HFA;VENTOLIN HFA) 108 (90 BASE) MCG/ACT inhaler 2 puffs 5 minutes apart every 4 to 6 hours as needed to rescue asthma (Patient taking differently: Inhale 2 puffs into the lungs 5 minutes apart every 4 to 6 hours as needed to rescue asthma) 1 Inhaler 11  . allopurinol (ZYLOPRIM) 300 MG tablet TAKE 1 TABLET BY MOUTH EVERY DAY 90 tablet 1  . aspirin 81 MG tablet Take 81 mg by mouth daily.      Marland Kitchen azelastine (ASTELIN) 0.1 % nasal spray Place 2 sprays into both nostrils 2 (two) times daily. Use in each nostril as directed 30 mL 2  . Cetirizine HCl 10 MG CAPS Take 1 capsule (10 mg total) by mouth at bedtime. 90 capsule 2  . furosemide (LASIX) 80 MG tablet TAKE 1 TABLET BY MOUTH TWICE A DAY AS NEEDED FOR FLUID (Patient taking differently: Take 40 mg by mouth daily. TAKE 1 TABLET BY MOUTH TWICE A DAY AS NEEDED FOR FLUID) 180 tablet 1  .  gabapentin (NEURONTIN) 300 MG capsule TAKE ONE CAPSULE BY MOUTH 3 TIMES A DAY 270 capsule 1  . montelukast (SINGULAIR) 10 MG tablet TAKE 1 TABLET EVERY DAY FOR ALLERGIES 90 tablet 1  . pravastatin (PRAVACHOL) 40 MG tablet TAKE 1 TABLET BY MOUTH AT BEDTIME FOR CHOLESTEROL 90 tablet 1  . prochlorperazine (COMPAZINE) 5 MG tablet TAKE 1 TABLET BY MOUTH 3 TIMES A DAY FOR VERTIGO OR NAUSEA 90 tablet 1  . benazepril (LOTENSIN) 20 MG tablet Take 20 mg by mouth daily.    . bisoprolol-hydrochlorothiazide (ZIAC) 5-6.25 MG tablet Take 1 tablet by mouth daily. 90 tablet 3  . digoxin (LANOXIN) 0.25 MG tablet TAKE 1 TABLET BY MOUTH DAILY 90 tablet 3  . ipratropium (ATROVENT) 0.02 % nebulizer solution Take 0.5 mg by nebulization every 4 (four) hours as needed for wheezing or shortness of breath.     Marland Kitchen ipratropium-albuterol (DUONEB) 0.5-2.5 (3) MG/3ML SOLN Use 3 cc  4 x day or every 4 hours if needed for shortness of breath. 360 mL 99  . meclizine (ANTIVERT) 25 MG tablet Take 1 tablet (25 mg total) by mouth 3 (three) times daily as needed for dizziness. 60 tablet 1  . metolazone (ZAROXOLYN) 2.5 MG tablet Take 1 tablet (2.5 mg total) by mouth daily. 10 tablet 0  . potassium chloride SA (K-DUR,KLOR-CON) 20 MEQ tablet Take 2 tablets (40 mEq total) by mouth daily. 30 tablet 0  .  warfarin (COUMADIN) 2 MG tablet TAKE 1 TO 2 TABLETS BY MOUTH DAILY OR AS DIRECTED 180 tablet 3    Physical Exam: General appearance: alert and no distress Neck: no carotid bruit and no JVD Lungs: diminished breath sounds bilaterally, dullness to percussion RLL and rales RLL Heart: irregularly irregular rhythm Extremities: extremities normal, atraumatic, no cyanosis or edema Pulses: 2+ and symmetric Neurologic: Grossly normal  Lab Results: Results for orders placed or performed during the hospital encounter of 12/23/15 (from the past 48 hour(s))  Basic metabolic panel     Status: Abnormal   Collection Time: 12/23/15  1:40 PM  Result  Value Ref Range   Sodium 136 135 - 145 mmol/L   Potassium 4.8 3.5 - 5.1 mmol/L    Comment: HEMOLYSIS AT THIS LEVEL MAY AFFECT RESULT   Chloride 100 (L) 101 - 111 mmol/L   CO2 25 22 - 32 mmol/L   Glucose, Bld 119 (H) 65 - 99 mg/dL   BUN 20 6 - 20 mg/dL   Creatinine, Ser 1.44 (H) 0.44 - 1.00 mg/dL   Calcium 9.8 8.9 - 10.3 mg/dL   GFR calc non Af Amer 35 (L) >60 mL/min   GFR calc Af Amer 41 (L) >60 mL/min    Comment: (NOTE) The eGFR has been calculated using the CKD EPI equation. This calculation has not been validated in all clinical situations. eGFR's persistently <60 mL/min signify possible Chronic Kidney Disease.    Anion gap 11 5 - 15  CBC     Status: Abnormal   Collection Time: 12/23/15  1:40 PM  Result Value Ref Range   WBC 15.6 (H) 4.0 - 10.5 K/uL   RBC 4.92 3.87 - 5.11 MIL/uL   Hemoglobin 15.8 (H) 12.0 - 15.0 g/dL   HCT 46.4 (H) 36.0 - 46.0 %   MCV 94.3 78.0 - 100.0 fL   MCH 32.1 26.0 - 34.0 pg   MCHC 34.1 30.0 - 36.0 g/dL   RDW 14.7 11.5 - 15.5 %   Platelets 175 150 - 400 K/uL  Digoxin level     Status: None   Collection Time: 12/23/15  1:40 PM  Result Value Ref Range   Digoxin Level 0.8 0.8 - 2.0 ng/mL  I-stat troponin, ED     Status: None   Collection Time: 12/23/15  2:03 PM  Result Value Ref Range   Troponin i, poc 0.01 0.00 - 0.08 ng/mL   Comment 3            Comment: Due to the release kinetics of cTnI, a negative result within the first hours of the onset of symptoms does not rule out myocardial infarction with certainty. If myocardial infarction is still suspected, repeat the test at appropriate intervals.   Brain natriuretic peptide     Status: Abnormal   Collection Time: 12/23/15  3:06 PM  Result Value Ref Range   B Natriuretic Peptide 282.2 (H) 0.0 - 100.0 pg/mL  Protime-INR     Status: Abnormal   Collection Time: 12/23/15  3:06 PM  Result Value Ref Range   Prothrombin Time 27.8 (H) 11.4 - 15.2 seconds   INR 2.54   I-Stat Troponin, ED (not at  [facility])     Status: None   Collection Time: 12/23/15  5:41 PM  Result Value Ref Range   Troponin i, poc 0.03 0.00 - 0.08 ng/mL   Comment 3            Comment: Due to the release kinetics  of cTnI, a negative result within the first hours of the onset of symptoms does not rule out myocardial infarction with certainty. If myocardial infarction is still suspected, repeat the test at appropriate intervals.   MRSA PCR Screening     Status: None   Collection Time: 12/23/15  9:00 PM  Result Value Ref Range   MRSA by PCR NEGATIVE NEGATIVE    Comment:        The GeneXpert MRSA Assay (FDA approved for NASAL specimens only), is one component of a comprehensive MRSA colonization surveillance program. It is not intended to diagnose MRSA infection nor to guide or monitor treatment for MRSA infections.   TSH     Status: None   Collection Time: 12/24/15  1:45 AM  Result Value Ref Range   TSH 2.022 0.350 - 4.500 uIU/mL    Comment: Performed by a 3rd Generation assay with a functional sensitivity of <=0.01 uIU/mL.  Protime-INR     Status: Abnormal   Collection Time: 12/24/15  1:45 AM  Result Value Ref Range   Prothrombin Time 24.2 (H) 11.4 - 15.2 seconds   INR 2.13   Strep pneumoniae urinary antigen     Status: None   Collection Time: 12/24/15  4:06 PM  Result Value Ref Range   Strep Pneumo Urinary Antigen NEGATIVE NEGATIVE    Comment:        Infection due to S. pneumoniae cannot be absolutely ruled out since the antigen present may be below the detection limit of the test.   Culture, sputum-assessment     Status: None   Collection Time: 12/24/15  6:11 PM  Result Value Ref Range   Specimen Description SPUTUM    Special Requests NONE    Sputum evaluation      THIS SPECIMEN IS ACCEPTABLE. RESPIRATORY CULTURE REPORT TO FOLLOW.   Report Status 12/24/2015 FINAL   Culture, respiratory (NON-Expectorated)     Status: None (Preliminary result)   Collection Time: 12/24/15  6:11 PM  Result  Value Ref Range   Specimen Description SPUTUM    Special Requests NONE    Gram Stain      ABUNDANT WBC PRESENT,BOTH PMN AND MONONUCLEAR RARE GRAM POSITIVE COCCI IN PAIRS RARE GRAM NEGATIVE RODS    Culture TOO YOUNG TO READ    Report Status PENDING   Lactic acid, plasma     Status: None   Collection Time: 12/24/15  6:46 PM  Result Value Ref Range   Lactic Acid, Venous 1.7 0.5 - 1.9 mmol/L  CBC     Status: Abnormal   Collection Time: 12/24/15  6:46 PM  Result Value Ref Range   WBC 20.5 (H) 4.0 - 10.5 K/uL   RBC 4.37 3.87 - 5.11 MIL/uL   Hemoglobin 13.9 12.0 - 15.0 g/dL   HCT 41.4 36.0 - 46.0 %   MCV 94.7 78.0 - 100.0 fL   MCH 31.8 26.0 - 34.0 pg   MCHC 33.6 30.0 - 36.0 g/dL   RDW 15.1 11.5 - 15.5 %   Platelets 167 150 - 400 K/uL  Protime-INR     Status: Abnormal   Collection Time: 12/25/15  1:52 AM  Result Value Ref Range   Prothrombin Time 20.1 (H) 11.4 - 15.2 seconds   INR 1.69   CBC     Status: Abnormal   Collection Time: 12/25/15  1:52 AM  Result Value Ref Range   WBC 17.3 (H) 4.0 - 10.5 K/uL   RBC 4.28 3.87 - 5.11  MIL/uL   Hemoglobin 13.5 12.0 - 15.0 g/dL   HCT 40.9 36.0 - 46.0 %   MCV 95.6 78.0 - 100.0 fL   MCH 31.5 26.0 - 34.0 pg   MCHC 33.0 30.0 - 36.0 g/dL   RDW 15.2 11.5 - 15.5 %   Platelets 179 150 - 400 K/uL  Basic metabolic panel     Status: Abnormal   Collection Time: 12/25/15  1:52 AM  Result Value Ref Range   Sodium 134 (L) 135 - 145 mmol/L   Potassium 4.5 3.5 - 5.1 mmol/L   Chloride 99 (L) 101 - 111 mmol/L   CO2 27 22 - 32 mmol/L   Glucose, Bld 161 (H) 65 - 99 mg/dL   BUN 23 (H) 6 - 20 mg/dL   Creatinine, Ser 1.53 (H) 0.44 - 1.00 mg/dL   Calcium 9.3 8.9 - 10.3 mg/dL   GFR calc non Af Amer 33 (L) >60 mL/min   GFR calc Af Amer 38 (L) >60 mL/min    Comment: (NOTE) The eGFR has been calculated using the CKD EPI equation. This calculation has not been validated in all clinical situations. eGFR's persistently <60 mL/min signify possible Chronic  Kidney Disease.    Anion gap 8 5 - 15    Imaging: Nm Pulmonary Perf And Vent  Result Date: 12/23/2015 CLINICAL DATA:  Shortness of breath, AFib EXAM: NUCLEAR MEDICINE VENTILATION - PERFUSION LUNG SCAN TECHNIQUE: Ventilation images were obtained in multiple projections using inhaled aerosol Tc-45mDTPA. Perfusion images were obtained in multiple projections after intravenous injection of Tc-970mAA. RADIOPHARMACEUTICALS:  31 mCi Technetium-9930mPA aerosol inhalation and 3.2 mCi Technetium-52m52m IV COMPARISON:  Chest x-ray 12/23/2015 FINDINGS: Ventilation: Heterogenous bilateral ventilation. Digestive tract activity is noted. Perfusion: No wedge shaped peripheral perfusion defects to suggest acute pulmonary embolism. The heart is enlarged. IMPRESSION: Very low probability examination for PE. Electronically Signed   By: Kim Donavan Foil.   On: 12/23/2015 21:02   Dg Chest Port 1 View  Result Date: 12/24/2015 CLINICAL DATA:  Rales.  Chest pain and leukocytosis EXAM: PORTABLE CHEST 1 VIEW COMPARISON:  Yesterday FINDINGS: Unchanged cardiopericardial enlargement and aortic tortuosity with vascular pedicle widening. Cephalized blood flow and diffuse interstitial opacity. There is asymmetric opacity at the right base, persisting from yesterday. No effusion or pneumothorax. IMPRESSION: 1. Cardiomegaly and mild pulmonary edema. 2. Possible superimposed pneumonia at the right base. Electronically Signed   By: JonaMonte Fantasia.   On: 12/24/2015 13:17   Dg Chest Port 1 View  Result Date: 12/23/2015 CLINICAL DATA:  Right-sided chest pain and increasing shortness of breath EXAM: PORTABLE CHEST 1 VIEW COMPARISON:  Chest radiograph 12/23/2015 at 2:37 p.m. FINDINGS: Unchanged cardiomegaly with aortic arch atherosclerosis. There are increased opacities in the right lung base, likely pulmonary edema. No other area of consolidation. No pneumothorax or pleural effusion. IMPRESSION: Worsening opacities in the  right lung base, likely pulmonary edema. Electronically Signed   By: KeviUlyses Jarred.   On: 12/23/2015 22:51   Dg Chest Portable 1 View  Result Date: 12/23/2015 CLINICAL DATA:  Arrhythmia. Spitting up blood. Atrial fibrillation. EXAM: PORTABLE CHEST 1 VIEW COMPARISON:  03/18/2014 FINDINGS: Cardiac silhouette is enlarged. Atherosclerotic calcifications at the aortic arch. Lungs are essentially clear without pulmonary edema. No large pleural effusions. No acute bone abnormality. IMPRESSION: Cardiomegaly without focal airspace disease or pulmonary edema. Aortic atherosclerosis. Electronically Signed   By: AdamMarkus Daft.   On: 12/23/2015 14:46    Assessment:  Active Problems:   Essential hypertension   Atrial fibrillation (HCC)   Congestive heart failure (HCC)   COPD (chronic obstructive pulmonary disease) with chronic bronchitis (HCC)   GERD   Hypothyroidism   Long term current use of anticoagulant therapy   CKD  stage III (GFR 51 ml/min)   Hyperlipidemia   Atrial fibrillation with rapid ventricular response ([place])   Sepsis due to pneumonia ([place])   Plan:  1. Looks like RLL pneumonia - leukocytosis is improving with antibiotics. Do not appreciate significant volume overload at this point. Likely had acute diastolic CHF in the setting of a-fib with RVR. Restart home lasix 40 mg daily. Hold off on zaroxolyn for now. LVEF stable.   No further cardiac suggestions at this time. Follow-up with Dr. Lovena Le after discharge. Cardiology will sign-off.  Time Spent Directly with Patient:  15 minutes  Length of Stay:  LOS: 1 day   Pixie Casino, MD, [facility] Attending Cardiologist [place] 12/25/2015, 11:55 AM

## 2015-12-26 DIAGNOSIS — J14 Pneumonia due to Hemophilus influenzae: Secondary | ICD-10-CM

## 2015-12-26 LAB — BASIC METABOLIC PANEL
Anion gap: 10 (ref 5–15)
BUN: 37 mg/dL — AB (ref 6–20)
CHLORIDE: 99 mmol/L — AB (ref 101–111)
CO2: 26 mmol/L (ref 22–32)
Calcium: 9.5 mg/dL (ref 8.9–10.3)
Creatinine, Ser: 1.79 mg/dL — ABNORMAL HIGH (ref 0.44–1.00)
GFR calc Af Amer: 31 mL/min — ABNORMAL LOW (ref >60)
GFR calc non Af Amer: 27 mL/min — ABNORMAL LOW (ref >60)
Glucose, Bld: 160 mg/dL — ABNORMAL HIGH (ref 65–99)
POTASSIUM: 4.2 mmol/L (ref 3.5–5.1)
SODIUM: 135 mmol/L (ref 135–145)

## 2015-12-26 LAB — CBC
HEMATOCRIT: 40.1 % (ref 36.0–46.0)
HEMOGLOBIN: 13.2 g/dL (ref 12.0–15.0)
MCH: 31.1 pg (ref 26.0–34.0)
MCHC: 32.9 g/dL (ref 30.0–36.0)
MCV: 94.6 fL (ref 78.0–100.0)
Platelets: 196 10*3/uL (ref 150–400)
RBC: 4.24 MIL/uL (ref 3.87–5.11)
RDW: 14.9 % (ref 11.5–15.5)
WBC: 15 10*3/uL — ABNORMAL HIGH (ref 4.0–10.5)

## 2015-12-26 LAB — PROTIME-INR
INR: 1.7
PROTHROMBIN TIME: 20.2 s — AB (ref 11.4–15.2)

## 2015-12-26 MED ORDER — WHITE PETROLATUM GEL
Status: DC | PRN
  Administered 2015-12-26: 16:00:00 via TOPICAL
  Filled 2015-12-26: qty 1

## 2015-12-26 MED ORDER — WARFARIN SODIUM 5 MG PO TABS
5.0000 mg | ORAL_TABLET | Freq: Once | ORAL | Status: AC
  Administered 2015-12-26: 5 mg via ORAL
  Filled 2015-12-26: qty 1

## 2015-12-26 MED ORDER — TRAMADOL HCL 50 MG PO TABS
50.0000 mg | ORAL_TABLET | Freq: Four times a day (QID) | ORAL | Status: DC | PRN
  Administered 2015-12-26 – 2015-12-30 (×2): 50 mg via ORAL
  Filled 2015-12-26 (×2): qty 1

## 2015-12-26 NOTE — Progress Notes (Signed)
[place] for coumadin Indication: atrial fibrillation  Allergies  Allergen Reactions  . Advair Diskus [Fluticasone-Salmeterol] Other (See Comments)    SKIN CHANGES.  Can tolerate low dose of prednisone without complications  . Amiodarone Other (See Comments)    PULMONARY TOXICITY  . Codeine     unknown  . Diovan [Valsartan] Other (See Comments)    HYPOTENSION  . Doxycycline Diarrhea and Other (See Comments)    VISUAL DISTURBANCE  . Flexeril [Cyclobenzaprine] Other (See Comments)    FATIGUE  . Keflex [Cephalexin] Diarrhea  . Verapamil Other (See Comments)    EDEMA    Patient Measurements: Height: 5\' 3"  (160 cm) Weight: 205 lb 7.5 oz (93.2 kg) IBW/kg (Calculated) : 52.4  Vital Signs: Temp: 97.4 F (36.3 C) (10/13 0813) Temp Source: Axillary (10/13 0813) BP: 130/75 (10/13 0813) Pulse Rate: 77 (10/13 0813)  Labs:  Recent Labs  12/23/15 1340  12/24/15 0145 12/24/15 1846 12/25/15 0152 12/26/15 0319  HGB 15.8*  --   --  13.9 13.5 13.2  HCT 46.4*  --   --  41.4 40.9 40.1  PLT 175  --   --  167 179 196  LABPROT  --   < > 24.2*  --  20.1* 20.2*  INR  --   < > 2.13  --  1.69 1.70  CREATININE 1.44*  --   --   --  1.53* 1.79*  < > = values in this interval not displayed.  Estimated Creatinine Clearance: 30.4 mL/min (by C-G formula based on SCr of 1.79 mg/dL (H)).   Medical History: Past Medical History:  Diagnosis Date  . Aortic stenosis    mild AS witm mild to mod AR by 11/2012 echo  . Arthritis   . Atrial fibrillation ([place])   . Atrial flutter ([place])   . Bronchitis, chronic ([place])   . Cervical dysplasia   . Coronary artery disease   . Cystocele    either on bladder or kidneys patient is unsure but stated physician will watch it  . Dizziness    If patient gets out of bed too fast  . Family history of anesthesia complication    Hard for daughter to wake up after Anesthesia  . Gout   . H/O hiatal hernia   . History of  Bell's palsy   . Migraine headache   . Osteopenia   . Rosacea conjunctivitis    Right eye is worse than left eye  . Seasonal allergies   . Shortness of breath    with exertion  . Thyroid disease    Hypo  . Tobacco abuse   . Unspecified vitamin D deficiency     Assessment: 41 YOF on coumadin PTA for afib to continue inpatient per Pharmacy. INR on admission therapeutic at 2.54. Last dose 10/10 PTA. Per conversation with med rec, pt seems confused about the medications she is taking. Claims she only uses [facility] but they have no record of any recent coumadin Rx system-wide. Appointment note from Dr. Melford Aase on 9/12 shows coumadin dose changed to 2mg  daily. Will use as a starting point for dosing. V/Q scan shows very low probability of PE. Noted on digoxin, levaquin, and fluconazole.  INR stable 1.7 after increased dose last night. CBC wnl. No bleeding reported.   Goal of Therapy:  INR 2-3 Monitor platelets by anticoagulation protocol: Yes   Plan:  -Coumadin 5mg  x 1 dose tonight -Daily INR -Monitor CBC, s/sx bleeding  Elicia Lamp, PharmD, BCPS Clinical Pharmacist 12/26/2015 8:47 AM

## 2015-12-26 NOTE — Progress Notes (Signed)
Patient ID: Katie [name], female   DOB: 1940-04-06, 74 y.o.   MRN: [id number]    PROGRESS NOTE    Katie [name]  JME:[id number] DOB: Jul 23, 1940 DOA: 12/23/2015  PCP: [name], MD   Brief Narrative:  74 y.o. female, with past medical history of COPD, hypertension, atrial fibrillation on  Warfarin,nonischemic cardiomyopathy, nonobstructive coronary artery disease patient presents with complaints of palpitation and shortness of breath this a.m.reports  Has been having progressive dyspnea over few month, but then significantly worsened over last week.  Assessment & Plan:   Permanent A. fib (CHADSVASC 5) with rapid ventricular response - currently on cardizem for rate control  - also on digoxin and coumadin  - appreciate cardiology team assistance, keep on cardiac monitor for now but suspect can be transferred to tele unit  - HR has been controlled in 60 - 70's   Acute hypoxic respiratory failure secondary to likely COPD exacerbation and RLL PNA, H. Influenzae - pt placed on Levaquin IV, continue day #3 - follow up on blood and sputum cultures - treat COPD with bronchodilators and steroids - keep on oxygen via [place] to keep O2 sats > 90% - since pt had some mild hemoptysis Ct chest requested and notable for Interstitial lung disease with bilateral peripheral ground-glass opacities in an upper lobe predominant distribution, with some radiographic improvement from remote prior CT. - also on Ct chest, patchy and ground-glass opacities throughout the right lower lobe, asymmetric involvement of interstitial lung disease versus superimposed acute abnormality such as infection - pt sees Dr. Melvyn Novas, I will send message to see if pt can have follow up appointment once discharged   Sepsis secondary to RLL PNA, hemophilus influenzae  - pt did meet criteria for sepsis on admission with WBC 15K, RR > 20 and HR > 90 bpm - pneumonia order set placed which essentialy includes ABX, blood and  sputum cultures  - will continue Levaquin day #3, no fevers this AM, WBC trending down  - CBC in AM  Acute kidney injury - due to the above, slight rise in Cr in the past 24 hours  - encouraged oral intake  - also lasix was given IV so suspect this has contributed  - BMP in AM  Acute on Chronic combined systolic and diastolic congestive heart failure - weight 202 lbs on admission --> 199 --> 198 --> 205 lbs this AM - transitioned to oral Lasix 10/12, cardiology has singed off  - continue to monitor daily weights, strict I/O  Obesity  - Body mass index is 35.81 kg/m.  DVT prophylaxis: Coumadin  Code Status: Full  Family Communication: Patient and family at bedside  Disposition Plan: transfer to tele unit   Consultants:   Cardiology   Procedures:   None  Antimicrobials:   Levaquin 10/11 -->   Subjective: Still with exertional dyspnea and some hemoptysis, overall better.   Objective: Vitals:   12/26/15 1152 12/26/15 1155 12/26/15 1200 12/26/15 1346  BP: 131/72 131/72 125/68   Pulse: 60 (!) 51 67   Resp: 16 17 (!) 25   Temp: (!) 96.9 F (36.1 C)     TempSrc: Axillary     SpO2: 93% 94% 96% 92%  Weight:      Height:        Intake/Output Summary (Last 24 hours) at 12/26/15 1602 Last data filed at 12/26/15 1300  Gross per 24 hour  Intake  1020 ml  Output             1950 ml  Net             -930 ml   Filed Weights   12/24/15 1622 12/25/15 0643 12/26/15 0435  Weight: 90.6 kg (199 lb 11.8 oz) 90.1 kg (198 lb 10.2 oz) 93.2 kg (205 lb 7.5 oz)    Examination:  General exam: Appears in mild distress due to pain   Respiratory system: rhonchi at bases, still diminished breath sounds at bases, mild exp wheezing  Cardiovascular system: IRRRR. No JVD, rubs, gallops or clicks. No pedal edema. Gastrointestinal system: Abdomen is nondistended, soft and nontender. No organomegaly Central nervous system: Alert and oriented. No focal neurological  deficits. Extremities: Symmetric 5 x 5 power.  Data Reviewed: I have personally reviewed following labs and imaging studies  CBC:  Recent Labs Lab 12/23/15 1340 12/24/15 1846 12/25/15 0152 12/26/15 0319  WBC 15.6* 20.5* 17.3* 15.0*  HGB 15.8* 13.9 13.5 13.2  HCT 46.4* 41.4 40.9 40.1  MCV 94.3 94.7 95.6 94.6  PLT 175 167 179 324   Basic Metabolic Panel:  Recent Labs Lab 12/23/15 1340 12/25/15 0152 12/26/15 0319  NA 136 134* 135  K 4.8 4.5 4.2  CL 100* 99* 99*  CO2 25 27 26   GLUCOSE 119* 161* 160*  BUN 20 23* 37*  CREATININE 1.44* 1.53* 1.79*  CALCIUM 9.8 9.3 9.5   Coagulation Profile:  Recent Labs Lab 12/23/15 1506 12/24/15 0145 12/25/15 0152 12/26/15 0319  INR 2.54 2.13 1.69 1.70   Thyroid Function Tests:  Recent Labs  12/24/15 0145  TSH 2.022   Recent Results (from the past 240 hour(s))  MRSA PCR Screening     Status: None   Collection Time: 12/23/15  9:00 PM  Result Value Ref Range Status   MRSA by PCR NEGATIVE NEGATIVE Final    Comment:        The GeneXpert MRSA Assay (FDA approved for NASAL specimens only), is one component of a comprehensive MRSA colonization surveillance program. It is not intended to diagnose MRSA infection nor to guide or monitor treatment for MRSA infections.   Culture, blood (routine x 2) Call MD if unable to obtain prior to antibiotics being given     Status: None (Preliminary result)   Collection Time: 12/24/15  4:42 PM  Result Value Ref Range Status   Specimen Description BLOOD RIGHT HAND  Final   Special Requests IN PEDIATRIC BOTTLE 2CC  Final   Culture NO GROWTH 2 DAYS  Final   Report Status PENDING  Incomplete  Culture, sputum-assessment     Status: None   Collection Time: 12/24/15  6:11 PM  Result Value Ref Range Status   Specimen Description SPUTUM  Final   Special Requests NONE  Final   Sputum evaluation   Final    THIS SPECIMEN IS ACCEPTABLE. RESPIRATORY CULTURE REPORT TO FOLLOW.   Report Status  12/24/2015 FINAL  Final  Culture, respiratory (NON-Expectorated)     Status: None (Preliminary result)   Collection Time: 12/24/15  6:11 PM  Result Value Ref Range Status   Specimen Description SPUTUM  Final   Special Requests NONE  Final   Gram Stain   Final    ABUNDANT WBC PRESENT,BOTH PMN AND MONONUCLEAR RARE GRAM POSITIVE COCCI IN PAIRS RARE GRAM NEGATIVE RODS    Culture   Final    ABUNDANT HAEMOPHILUS INFLUENZAE BETA LACTAMASE NEGATIVE    Report Status PENDING  Incomplete      Radiology Studies: Ct Chest Wo Contrast Result Date: 12/25/2015 Interstitial lung disease with bilateral peripheral ground-glass opacities in an upper lobe predominant distribution, with some radiographic improvement from remote prior CT. 2. Patchy and ground-glass opacities throughout the right lower lobe, asymmetric involvement of interstitial lung disease versus superimposed acute abnormality such as infection. Pulmonary edema is felt less likely given distribution and he in the lateral involvement. 3. Cardiomegaly with coronary artery calcifications, atherosclerosis and tortuosity of the thoracic aorta. 4. Mild mediastinal adenopathy which may be reactive.     Scheduled Meds: . allopurinol  300 mg Oral Daily  . aspirin EC  81 mg Oral Daily  . azelastine  2 spray Each Nare BID  . cycloSPORINE  1 drop Both Eyes BID  . digoxin  250 mcg Oral Daily  . diltiazem  120 mg Oral Daily  . erythromycin  1 application Both Eyes QHS  . fluconazole  150 mg Oral Q Wed  . furosemide  40 mg Oral Daily  . gabapentin  300 mg Oral TID  . guaiFENesin  600 mg Oral BID  . levalbuterol  1.25 mg Nebulization Q8H  . levofloxacin (LEVAQUIN) IV  750 mg Intravenous Q48H  . levothyroxine  50 mcg Oral QAC breakfast  . methylPREDNISolone (SOLU-MEDROL) injection  40 mg Intravenous Q12H  . pravastatin  40 mg Oral q1800  . prednisoLONE acetate  1 drop Both Eyes TID  . warfarin  5 mg Oral ONCE-1800  . Warfarin - Pharmacist  Dosing Inpatient   Does not apply q1800   Continuous Infusions:    LOS: 2 days   Time spent: 20 minutes   Faye Ramsay, MD [facility] Pager [phone]  If 7PM-7AM, please contact night-coverage [email or web address] Password [email or web address] 12/26/2015, 4:02 PM

## 2015-12-27 DIAGNOSIS — A413 Sepsis due to Hemophilus influenzae: Secondary | ICD-10-CM

## 2015-12-27 LAB — CBC
HCT: 39.4 % (ref 36.0–46.0)
Hemoglobin: 13.6 g/dL (ref 12.0–15.0)
MCH: 32.4 pg (ref 26.0–34.0)
MCHC: 34.5 g/dL (ref 30.0–36.0)
MCV: 93.8 fL (ref 78.0–100.0)
PLATELETS: 215 10*3/uL (ref 150–400)
RBC: 4.2 MIL/uL (ref 3.87–5.11)
RDW: 14.9 % (ref 11.5–15.5)
WBC: 11.9 10*3/uL — ABNORMAL HIGH (ref 4.0–10.5)

## 2015-12-27 LAB — BASIC METABOLIC PANEL
Anion gap: 10 (ref 5–15)
BUN: 39 mg/dL — AB (ref 6–20)
CO2: 27 mmol/L (ref 22–32)
CREATININE: 1.46 mg/dL — AB (ref 0.44–1.00)
Calcium: 9.4 mg/dL (ref 8.9–10.3)
Chloride: 100 mmol/L — ABNORMAL LOW (ref 101–111)
GFR calc Af Amer: 40 mL/min — ABNORMAL LOW (ref >60)
GFR, EST NON AFRICAN AMERICAN: 34 mL/min — AB (ref >60)
GLUCOSE: 184 mg/dL — AB (ref 65–99)
POTASSIUM: 3.9 mmol/L (ref 3.5–5.1)
SODIUM: 137 mmol/L (ref 135–145)

## 2015-12-27 LAB — PROTIME-INR
INR: 2.36
PROTHROMBIN TIME: 26.2 s — AB (ref 11.4–15.2)

## 2015-12-27 LAB — CULTURE, RESPIRATORY W GRAM STAIN

## 2015-12-27 LAB — CULTURE, RESPIRATORY

## 2015-12-27 MED ORDER — SIMETHICONE 40 MG/0.6ML PO SUSP
40.0000 mg | Freq: Four times a day (QID) | ORAL | Status: DC | PRN
  Filled 2015-12-27: qty 0.6

## 2015-12-27 MED ORDER — METHYLPREDNISOLONE SODIUM SUCC 40 MG IJ SOLR
40.0000 mg | Freq: Every day | INTRAMUSCULAR | Status: DC
  Administered 2015-12-27: 40 mg via INTRAVENOUS

## 2015-12-27 MED ORDER — OXYBUTYNIN CHLORIDE 5 MG PO TABS
5.0000 mg | ORAL_TABLET | Freq: Two times a day (BID) | ORAL | Status: DC
  Administered 2015-12-27 – 2015-12-30 (×6): 5 mg via ORAL
  Filled 2015-12-27 (×6): qty 1

## 2015-12-27 MED ORDER — LEVOFLOXACIN 750 MG PO TABS
750.0000 mg | ORAL_TABLET | Freq: Every day | ORAL | Status: DC
  Administered 2015-12-27 – 2015-12-28 (×2): 750 mg via ORAL
  Filled 2015-12-27 (×2): qty 1

## 2015-12-27 MED ORDER — WARFARIN SODIUM 2 MG PO TABS
2.0000 mg | ORAL_TABLET | Freq: Every day | ORAL | Status: DC
  Administered 2015-12-27: 2 mg via ORAL
  Filled 2015-12-27: qty 1

## 2015-12-27 MED ORDER — LEVALBUTEROL HCL 1.25 MG/0.5ML IN NEBU
1.2500 mg | INHALATION_SOLUTION | Freq: Three times a day (TID) | RESPIRATORY_TRACT | Status: DC
  Administered 2015-12-27 – 2015-12-29 (×8): 1.25 mg via RESPIRATORY_TRACT
  Filled 2015-12-27 (×8): qty 0.5

## 2015-12-27 NOTE — Progress Notes (Addendum)
Patient ID: Katie [name], female   DOB: 1941-01-29, 75 y.o.   MRN: [id number]    PROGRESS NOTE    Katie [name]  CMK:[id number] DOB: [birth year] DOA: 12/23/2015  PCP: [name], MD   Brief Narrative:  75 y.o. female, with past medical history of COPD, hypertension, atrial fibrillation on  Warfarin,nonischemic cardiomyopathy, nonobstructive coronary artery disease patient presents with complaints of palpitation and shortness of breath this a.m.reports  Has been having progressive dyspnea over few month, but then significantly worsened over last week.  Assessment & Plan:   Permanent A. fib (CHADSVASC 5) with rapid ventricular response - currently on cardizem for rate control and it seems to be working well as HR has been stable and at target range  - also on digoxin and coumadin  - appreciate cardiology team assistance, suspect it is reasonable to take of telemetry monitor as pt doing better, no chest pain   Acute hypoxic respiratory failure secondary to likely COPD exacerbation and RLL PNA, H. Influenzae - pt placed on Levaquin IV, continue day #4/7 - sputum cultures with bacteria H. Influenzae and FQ should be adequate in coverage  - treat COPD with bronchodilators and steroids - keep on oxygen via [place] to keep O2 sats > 90% - due to hemoptysis Ct chest requested and notable for Interstitial lung disease with bilateral peripheral ground-glass opacities in an upper lobe predominant distribution, with some radiographic improvement from remote prior CT. - also on Ct chest, patchy and ground-glass opacities throughout the right lower lobe, asymmetric involvement of interstitial lung disease versus superimposed acute abnormality such as infection - pt sees Dr. Melvyn Novas, I will send message to see if pt can have follow up appointment once discharged   Sepsis secondary to RLL PNA, hemophilus influenzae  - pt did meet criteria for sepsis on admission with WBC 15K, RR > 20 and HR > 90  bpm - will continue Levaquin day #4/7, no fevers this AM, WBC trending down  - CBC in AM  Acute kidney injury - lasix was given IV so suspect this has contributed - encouraged oral intake and Cr is not trending down  - BMP in AM  Acute on Chronic combined systolic and diastolic congestive heart failure - weight 202 lbs on admission --> 199 --> 198 --> 205 --> 201 lbs this AM - transitioned to oral Lasix 10/12, cardiology has singed off  - continue to monitor daily weights, strict I/O  Obesity  - Body mass index is 35.81 kg/m.  DVT prophylaxis: Coumadin  Code Status: Full  Family Communication: Patient and family at bedside  Disposition Plan: possible d/c in AM   Consultants:   Cardiology   Procedures:   None  Antimicrobials:   Levaquin 10/11 -->   Subjective: Coughing improving, feels better overall.  Objective: Vitals:   12/26/15 2005 12/26/15 2113 12/27/15 0612 12/27/15 0719  BP: (!) 125/52  127/79   Pulse: 75 75 83   Resp: 18 19 18    Temp: 97.7 F (36.5 C)  98.1 F (36.7 C)   TempSrc: Oral  Oral   SpO2: 94% 94% 95% 95%  Weight:   91.4 kg (201 lb 9.6 oz)   Height:        Intake/Output Summary (Last 24 hours) at 12/27/15 1023 Last data filed at 12/27/15 0559  Gross per 24 hour  Intake             1830 ml  Output  1300 ml  Net              530 ml   Filed Weights   12/25/15 0643 12/26/15 0435 12/27/15 0612  Weight: 90.1 kg (198 lb 10.2 oz) 93.2 kg (205 lb 7.5 oz) 91.4 kg (201 lb 9.6 oz)    Examination:  General exam: Appears in mild distress due to pain   Respiratory system: rhonchi at bases, still diminished breath sounds at bases, no wheezing this AM  Cardiovascular system: IRRRR. No JVD, rubs, gallops or clicks. No pedal edema. Gastrointestinal system: Abdomen is nondistended, soft and nontender. No organomegaly Central nervous system: Alert and oriented. No focal neurological deficits. Extremities: Symmetric 5 x 5 power.  Data  Reviewed: I have personally reviewed following labs and imaging studies  CBC:  Recent Labs Lab 12/23/15 1340 12/24/15 1846 12/25/15 0152 12/26/15 0319 12/27/15 0331  WBC 15.6* 20.5* 17.3* 15.0* 11.9*  HGB 15.8* 13.9 13.5 13.2 13.6  HCT 46.4* 41.4 40.9 40.1 39.4  MCV 94.3 94.7 95.6 94.6 93.8  PLT 175 167 179 196 433   Basic Metabolic Panel:  Recent Labs Lab 12/23/15 1340 12/25/15 0152 12/26/15 0319 12/27/15 0331  NA 136 134* 135 137  K 4.8 4.5 4.2 3.9  CL 100* 99* 99* 100*  CO2 25 27 26 27   GLUCOSE 119* 161* 160* 184*  BUN 20 23* 37* 39*  CREATININE 1.44* 1.53* 1.79* 1.46*  CALCIUM 9.8 9.3 9.5 9.4   Coagulation Profile:  Recent Labs Lab 12/23/15 1506 12/24/15 0145 12/25/15 0152 12/26/15 0319 12/27/15 0331  INR 2.54 2.13 1.69 1.70 2.36   Thyroid Function Tests: No results for input(s): TSH, T4TOTAL, FREET4, T3FREE, THYROIDAB in the last 72 hours. Recent Results (from the past 240 hour(s))  MRSA PCR Screening     Status: None   Collection Time: 12/23/15  9:00 PM  Result Value Ref Range Status   MRSA by PCR NEGATIVE NEGATIVE Final    Comment:        The GeneXpert MRSA Assay (FDA approved for NASAL specimens only), is one component of a comprehensive MRSA colonization surveillance program. It is not intended to diagnose MRSA infection nor to guide or monitor treatment for MRSA infections.   Culture, blood (routine x 2) Call MD if unable to obtain prior to antibiotics being given     Status: None (Preliminary result)   Collection Time: 12/24/15  4:42 PM  Result Value Ref Range Status   Specimen Description BLOOD RIGHT HAND  Final   Special Requests IN PEDIATRIC BOTTLE 2CC  Final   Culture NO GROWTH 2 DAYS  Final   Report Status PENDING  Incomplete  Culture, sputum-assessment     Status: None   Collection Time: 12/24/15  6:11 PM  Result Value Ref Range Status   Specimen Description SPUTUM  Final   Special Requests NONE  Final   Sputum evaluation    Final    THIS SPECIMEN IS ACCEPTABLE. RESPIRATORY CULTURE REPORT TO FOLLOW.   Report Status 12/24/2015 FINAL  Final  Culture, respiratory (NON-Expectorated)     Status: None (Preliminary result)   Collection Time: 12/24/15  6:11 PM  Result Value Ref Range Status   Specimen Description SPUTUM  Final   Special Requests NONE  Final   Gram Stain   Final    ABUNDANT WBC PRESENT,BOTH PMN AND MONONUCLEAR RARE GRAM POSITIVE COCCI IN PAIRS RARE GRAM NEGATIVE RODS    Culture   Final    ABUNDANT HAEMOPHILUS INFLUENZAE  BETA LACTAMASE NEGATIVE    Report Status PENDING  Incomplete      Radiology Studies: Ct Chest Wo Contrast Result Date: 12/25/2015 Interstitial lung disease with bilateral peripheral ground-glass opacities in an upper lobe predominant distribution, with some radiographic improvement from remote prior CT. 2. Patchy and ground-glass opacities throughout the right lower lobe, asymmetric involvement of interstitial lung disease versus superimposed acute abnormality such as infection. Pulmonary edema is felt less likely given distribution and he in the lateral involvement. 3. Cardiomegaly with coronary artery calcifications, atherosclerosis and tortuosity of the thoracic aorta. 4. Mild mediastinal adenopathy which may be reactive.     Scheduled Meds: . allopurinol  300 mg Oral Daily  . aspirin EC  81 mg Oral Daily  . azelastine  2 spray Each Nare BID  . cycloSPORINE  1 drop Both Eyes BID  . digoxin  250 mcg Oral Daily  . diltiazem  120 mg Oral Daily  . erythromycin  1 application Both Eyes QHS  . fluconazole  150 mg Oral Q Wed  . furosemide  40 mg Oral Daily  . gabapentin  300 mg Oral TID  . guaiFENesin  600 mg Oral BID  . levalbuterol  1.25 mg Nebulization TID  . levofloxacin (LEVAQUIN) IV  750 mg Intravenous Q48H  . levothyroxine  50 mcg Oral QAC breakfast  . methylPREDNISolone (SOLU-MEDROL) injection  40 mg Intravenous Q12H  . pravastatin  40 mg Oral q1800  . prednisoLONE  acetate  1 drop Both Eyes TID  . Warfarin - Pharmacist Dosing Inpatient   Does not apply q1800   Continuous Infusions:    LOS: 3 days   Time spent: 20 minutes   Faye Ramsay, MD [facility] Pager [phone]  If 7PM-7AM, please contact night-coverage [email or web address] Password [email or web address] 12/27/2015, 10:23 AM

## 2015-12-27 NOTE — Progress Notes (Signed)
Patient sitting up in bed, no needs at this time. Call light within reach.  

## 2015-12-27 NOTE — Progress Notes (Signed)
ANTICOAGULATION CONSULT NOTE - Follow Up Consult  Pharmacy Consult for Coumadin Indication: atrial fibrillation  Allergies  Allergen Reactions  . Advair Diskus [Fluticasone-Salmeterol] Other (See Comments)    SKIN CHANGES.  Can tolerate low dose of prednisone without complications  . Amiodarone Other (See Comments)    PULMONARY TOXICITY  . Codeine     unknown  . Diovan [Valsartan] Other (See Comments)    HYPOTENSION  . Doxycycline Diarrhea and Other (See Comments)    VISUAL DISTURBANCE  . Flexeril [Cyclobenzaprine] Other (See Comments)    FATIGUE  . Keflex [Cephalexin] Diarrhea  . Verapamil Other (See Comments)    EDEMA    Patient Measurements: Height: 5\' 3"  (160 cm) Weight: 201 lb 9.6 oz (91.4 kg) IBW/kg (Calculated) : 52.4  Vital Signs: Temp: 98.1 F (36.7 C) (10/14 0612) Temp Source: Oral (10/14 0612) BP: 138/64 (10/14 1410) Pulse Rate: 86 (10/14 1410)  Labs:  Recent Labs  12/25/15 0152 12/26/15 0319 12/27/15 0331  HGB 13.5 13.2 13.6  HCT 40.9 40.1 39.4  PLT 179 196 215  LABPROT 20.1* 20.2* 26.2*  INR 1.69 1.70 2.36  CREATININE 1.53* 1.79* 1.46*    Estimated Creatinine Clearance: 36.8 mL/min (by C-G formula based on SCr of 1.46 mg/dL (H)).   Assessment:  Anticoag: Warf pta - INR on admit 2.54 (see Rx note 10/13). Last dose 10/10 PTA. V/Q scan: very low probability of PE. INR today 1.7>2.36 large jump. H/H stable. Some hemoptysis noted with this respiratory illness. Drug interaction with Levaquin - PTA dose 2mg  daily  Goal of Therapy:  INR 2-3 Monitor platelets by anticoagulation protocol: Yes   Plan:  -Coumadin 2mg  po q1800 -Daily INR   Katie [name], PharmD, BCPS Clinical Staff Pharmacist Pager [phone]  Eilene Ghazi Stillinger 12/27/2015,2:20 PM

## 2015-12-28 DIAGNOSIS — N183 Chronic kidney disease, stage 3 (moderate): Secondary | ICD-10-CM

## 2015-12-28 LAB — CBC
HEMATOCRIT: 42.3 % (ref 36.0–46.0)
Hemoglobin: 14.2 g/dL (ref 12.0–15.0)
MCH: 31.6 pg (ref 26.0–34.0)
MCHC: 33.6 g/dL (ref 30.0–36.0)
MCV: 94 fL (ref 78.0–100.0)
PLATELETS: 268 10*3/uL (ref 150–400)
RBC: 4.5 MIL/uL (ref 3.87–5.11)
RDW: 15.1 % (ref 11.5–15.5)
WBC: 12 10*3/uL — AB (ref 4.0–10.5)

## 2015-12-28 LAB — BASIC METABOLIC PANEL
Anion gap: 12 (ref 5–15)
BUN: 37 mg/dL — ABNORMAL HIGH (ref 6–20)
CALCIUM: 9.3 mg/dL (ref 8.9–10.3)
CO2: 25 mmol/L (ref 22–32)
Chloride: 98 mmol/L — ABNORMAL LOW (ref 101–111)
Creatinine, Ser: 1.33 mg/dL — ABNORMAL HIGH (ref 0.44–1.00)
GFR, EST AFRICAN AMERICAN: 45 mL/min — AB (ref >60)
GFR, EST NON AFRICAN AMERICAN: 39 mL/min — AB (ref >60)
Glucose, Bld: 138 mg/dL — ABNORMAL HIGH (ref 65–99)
Potassium: 3.9 mmol/L (ref 3.5–5.1)
Sodium: 135 mmol/L (ref 135–145)

## 2015-12-28 LAB — PROTIME-INR
INR: 3.59
Prothrombin Time: 36.7 seconds — ABNORMAL HIGH (ref 11.4–15.2)

## 2015-12-28 MED ORDER — LEVOFLOXACIN 750 MG PO TABS
750.0000 mg | ORAL_TABLET | ORAL | Status: DC
  Administered 2015-12-30: 750 mg via ORAL
  Filled 2015-12-28: qty 1

## 2015-12-28 MED ORDER — PREDNISONE 20 MG PO TABS
50.0000 mg | ORAL_TABLET | Freq: Every day | ORAL | Status: DC
  Administered 2015-12-29 – 2015-12-30 (×2): 50 mg via ORAL
  Filled 2015-12-28 (×2): qty 2

## 2015-12-28 NOTE — Progress Notes (Signed)
Patient lying in bed, no needs at this time. Call light within reach. 

## 2015-12-28 NOTE — Progress Notes (Signed)
ANTICOAGULATION CONSULT NOTE - Follow Up Consult  Pharmacy Consult for Coumadin Indication: atrial fibrillation  Allergies  Allergen Reactions  . Advair Diskus [Fluticasone-Salmeterol] Other (See Comments)    SKIN CHANGES.  Can tolerate low dose of prednisone without complications  . Amiodarone Other (See Comments)    PULMONARY TOXICITY  . Codeine     unknown  . Diovan [Valsartan] Other (See Comments)    HYPOTENSION  . Doxycycline Diarrhea and Other (See Comments)    VISUAL DISTURBANCE  . Flexeril [Cyclobenzaprine] Other (See Comments)    FATIGUE  . Keflex [Cephalexin] Diarrhea  . Verapamil Other (See Comments)    EDEMA    Patient Measurements: Height: 5\' 3"  (160 cm) Weight: 201 lb 11.2 oz (91.5 kg) IBW/kg (Calculated) : 52.4  Vital Signs: Temp: 97.4 F (36.3 C) (10/15 0525) Temp Source: Oral (10/15 0525) BP: 130/78 (10/15 1020) Pulse Rate: 91 (10/15 1022)  Labs:  Recent Labs  12/26/15 0319 12/27/15 0331 12/28/15 0306  HGB 13.2 13.6 14.2  HCT 40.1 39.4 42.3  PLT 196 215 268  LABPROT 20.2* 26.2* 36.7*  INR 1.70 2.36 3.59  CREATININE 1.79* 1.46* 1.33*    Estimated Creatinine Clearance: 40.4 mL/min (by C-G formula based on SCr of 1.33 mg/dL (H)).   Assessment:  Anticoag: Warf pta - INR on admit 2.54 (see Rx note 10/13). Last dose 10/10 PTA. V/Q scan: very low probability of PE. INR today 1.7>2.36>3.59 large jump. H/H stable. Some hemoptysis noted with this respiratory illness.. Drug interaction with Levaquin - PTA dose 2mg  daily  Goal of Therapy:  INR 2-3 Monitor platelets by anticoagulation protocol: Yes   Plan:  -Hold Coumadin -Daily INR - IV>>po Levaquin   Melodie Ashworth S. Alford Highland, PharmD, BCPS Clinical Staff Pharmacist Pager [phone]  Eilene Ghazi Stillinger 12/28/2015,11:58 AM

## 2015-12-28 NOTE — Progress Notes (Signed)
Patient ID: Katie [name], female   DOB: 1940-09-05, 75 y.o.   MRN: [id number]    PROGRESS NOTE    Katie [name]  AJO:[id number] DOB: [birth year] DOA: 12/23/2015  PCP: [name], MD   Brief Narrative:  75 y.o. female, with past medical history of COPD, hypertension, atrial fibrillation on  Warfarin,nonischemic cardiomyopathy, nonobstructive coronary artery disease patient presents with complaints of palpitation and shortness of breath this a.m.reports  Has been having progressive dyspnea over few month, but then significantly worsened over last week.  Assessment & Plan:   Permanent A. fib (CHADSVASC 5) with rapid ventricular response - currently on cardizem for rate control and it seems to be working well as HR has been stable and at target range  - also on digoxin and coumadin  - appreciate cardiology team assistance, tele d/c for now   Acute hypoxic respiratory failure secondary to likely COPD exacerbation and RLL PNA, H. Influenzae - pt placed on Levaquin IV, continue day #5/7 - sputum cultures with bacteria H. Influenzae and FQ should be adequate in coverage  - treat COPD with bronchodilators and steroids - keep on oxygen via [place] to keep O2 sats > 90% - due to hemoptysis Ct chest requested and notable for Interstitial lung disease with bilateral peripheral ground-glass opacities in an upper lobe predominant distribution, with some radiographic improvement from remote prior CT. - also on Ct chest, patchy and ground-glass opacities throughout the right lower lobe, asymmetric involvement of interstitial lung disease versus superimposed acute abnormality such as infection - pt sees Dr. Melvyn Novas, I will send message to see if pt can have follow up appointment once discharged   Sepsis secondary to RLL PNA, hemophilus influenzae  - pt did meet criteria for sepsis on admission with WBC 15K, RR > 20 and HR > 90 bpm - will continue Levaquin day #5/7, no fevers this AM, WBC trending  down  - CBC in AM  Acute kidney injury - lasix was given IV so suspect this has contributed - encouraged oral intake and Cr is now trending down  - BMP in AM  Acute on Chronic combined systolic and diastolic congestive heart failure - weight 202 lbs on admission --> 199 --> 198 --> 205 --> 201 lbs this AM - transitioned to oral Lasix 10/12, cardiology has singed off  - ok to keep off tele monitor as pt with no chest pain and no dyspnea, no palpitations, no dizziness  - continue to monitor daily weights, strict I/O  Obesity  - Body mass index is 35.81 kg/m.  DVT prophylaxis: Coumadin  Code Status: Full  Family Communication: Patient and family (husband) at bedside  Disposition Plan: possible d/c in AM   Consultants:   Cardiology   Procedures:   None  Antimicrobials:   Levaquin 10/11 -->  Subjective: Coughing improving, feels better overall.  Objective: Vitals:   12/28/15 0525 12/28/15 0723 12/28/15 1020 12/28/15 1022  BP: (!) 145/77  130/78   Pulse: 70   91  Resp:      Temp: 97.4 F (36.3 C)     TempSrc: Oral     SpO2: 94% 96%    Weight: 91.5 kg (201 lb 11.2 oz)     Height:        Intake/Output Summary (Last 24 hours) at 12/28/15 1042 Last data filed at 12/28/15 1022  Gross per 24 hour  Intake              480 ml  Output             1500 ml  Net            -1020 ml   Filed Weights   12/26/15 0435 12/27/15 0612 12/28/15 0525  Weight: 93.2 kg (205 lb 7.5 oz) 91.4 kg (201 lb 9.6 oz) 91.5 kg (201 lb 11.2 oz)    Examination:  General exam: Appears in mild distress due to pain   Respiratory system: overall better air movement but still with diminished breath sounds at bases and scattered rhonchi  Cardiovascular system: IRRRR. No JVD, rubs, gallops or clicks. No pedal edema. Gastrointestinal system: Abdomen is nondistended, soft and nontender. No organomegaly Central nervous system: Alert and oriented. No focal neurological deficits. Extremities:  Symmetric 5 x 5 power.  Data Reviewed: I have personally reviewed following labs and imaging studies  CBC:  Recent Labs Lab 12/24/15 1846 12/25/15 0152 12/26/15 0319 12/27/15 0331 12/28/15 0306  WBC 20.5* 17.3* 15.0* 11.9* 12.0*  HGB 13.9 13.5 13.2 13.6 14.2  HCT 41.4 40.9 40.1 39.4 42.3  MCV 94.7 95.6 94.6 93.8 94.0  PLT 167 179 196 215 024   Basic Metabolic Panel:  Recent Labs Lab 12/23/15 1340 12/25/15 0152 12/26/15 0319 12/27/15 0331 12/28/15 0306  NA 136 134* 135 137 135  K 4.8 4.5 4.2 3.9 3.9  CL 100* 99* 99* 100* 98*  CO2 25 27 26 27 25   GLUCOSE 119* 161* 160* 184* 138*  BUN 20 23* 37* 39* 37*  CREATININE 1.44* 1.53* 1.79* 1.46* 1.33*  CALCIUM 9.8 9.3 9.5 9.4 9.3   Coagulation Profile:  Recent Labs Lab 12/24/15 0145 12/25/15 0152 12/26/15 0319 12/27/15 0331 12/28/15 0306  INR 2.13 1.69 1.70 2.36 3.59   Thyroid Function Tests: No results for input(s): TSH, T4TOTAL, FREET4, T3FREE, THYROIDAB in the last 72 hours. Recent Results (from the past 240 hour(s))  MRSA PCR Screening     Status: None   Collection Time: 12/23/15  9:00 PM  Result Value Ref Range Status   MRSA by PCR NEGATIVE NEGATIVE Final    Comment:        The GeneXpert MRSA Assay (FDA approved for NASAL specimens only), is one component of a comprehensive MRSA colonization surveillance program. It is not intended to diagnose MRSA infection nor to guide or monitor treatment for MRSA infections.   Culture, blood (routine x 2) Call MD if unable to obtain prior to antibiotics being given     Status: None (Preliminary result)   Collection Time: 12/24/15  4:42 PM  Result Value Ref Range Status   Specimen Description BLOOD RIGHT HAND  Final   Special Requests IN PEDIATRIC BOTTLE 2CC  Final   Culture NO GROWTH 3 DAYS  Final   Report Status PENDING  Incomplete  Culture, sputum-assessment     Status: None   Collection Time: 12/24/15  6:11 PM  Result Value Ref Range Status   Specimen  Description SPUTUM  Final   Special Requests NONE  Final   Sputum evaluation   Final    THIS SPECIMEN IS ACCEPTABLE. RESPIRATORY CULTURE REPORT TO FOLLOW.   Report Status 12/24/2015 FINAL  Final  Culture, respiratory (NON-Expectorated)     Status: None   Collection Time: 12/24/15  6:11 PM  Result Value Ref Range Status   Specimen Description SPUTUM  Final   Special Requests NONE  Final   Gram Stain   Final    ABUNDANT WBC PRESENT,BOTH PMN AND MONONUCLEAR RARE GRAM  POSITIVE COCCI IN PAIRS RARE GRAM NEGATIVE RODS    Culture   Final    ABUNDANT HAEMOPHILUS INFLUENZAE BETA LACTAMASE NEGATIVE    Report Status 12/27/2015 FINAL  Final      Radiology Studies: Ct Chest Wo Contrast Result Date: 12/25/2015 Interstitial lung disease with bilateral peripheral ground-glass opacities in an upper lobe predominant distribution, with some radiographic improvement from remote prior CT. 2. Patchy and ground-glass opacities throughout the right lower lobe, asymmetric involvement of interstitial lung disease versus superimposed acute abnormality such as infection. Pulmonary edema is felt less likely given distribution and he in the lateral involvement. 3. Cardiomegaly with coronary artery calcifications, atherosclerosis and tortuosity of the thoracic aorta. 4. Mild mediastinal adenopathy which may be reactive.     Scheduled Meds: . allopurinol  300 mg Oral Daily  . aspirin EC  81 mg Oral Daily  . azelastine  2 spray Each Nare BID  . cycloSPORINE  1 drop Both Eyes BID  . digoxin  250 mcg Oral Daily  . diltiazem  120 mg Oral Daily  . erythromycin  1 application Both Eyes QHS  . fluconazole  150 mg Oral Q Wed  . furosemide  40 mg Oral Daily  . gabapentin  300 mg Oral TID  . guaiFENesin  600 mg Oral BID  . levalbuterol  1.25 mg Nebulization TID  . levofloxacin  750 mg Oral Daily  . levothyroxine  50 mcg Oral QAC breakfast  . oxybutynin  5 mg Oral BID  . pravastatin  40 mg Oral q1800  .  prednisoLONE acetate  1 drop Both Eyes TID  . [START ON 12/29/2015] predniSONE  50 mg Oral Q breakfast  . warfarin  2 mg Oral q1800  . Warfarin - Pharmacist Dosing Inpatient   Does not apply q1800   Continuous Infusions:    LOS: 4 days   Time spent: 20 minutes   Faye Ramsay, MD [facility] Pager [phone]  If 7PM-7AM, please contact night-coverage [email or web address] Password [email or web address] 12/28/2015, 10:42 AM

## 2015-12-29 LAB — CULTURE, BLOOD (ROUTINE X 2): CULTURE: NO GROWTH

## 2015-12-29 LAB — PROTIME-INR
INR: 3.75
Prothrombin Time: 38 seconds — ABNORMAL HIGH (ref 11.4–15.2)

## 2015-12-29 NOTE — Progress Notes (Signed)
Patient ID: Katie [name], female   DOB: 07-14-1940, 75 y.o.   MRN: [id number]    PROGRESS NOTE    EVONY REZEK  MLJ:[id number] DOB: 1940/07/10 DOA: 12/23/2015  PCP: [name], MD   Brief Narrative:  75 y.o. female, with past medical history of COPD, hypertension, atrial fibrillation on  Warfarin,nonischemic cardiomyopathy, nonobstructive coronary artery disease patient presents with complaints of palpitation and shortness of breath this a.m.reports  Has been having progressive dyspnea over few month, but then significantly worsened over last week.  Assessment & Plan:   Permanent A. fib (CHADSVASC 5) with rapid ventricular response - currently on cardizem for rate control and it seems to be working well as HR has been stable and at target range  - also on digoxin and coumadin  - appreciate cardiology team assistance  Acute hypoxic respiratory failure secondary to likely COPD exacerbation and RLL PNA, H. Influenzae - pt placed on Levaquin IV, continue day #6/7 - sputum cultures with bacteria H. Influenzae and FQ should be adequate in coverage  - treat COPD with bronchodilators and steroids - keep on oxygen via [place] to keep O2 sats > 90% - due to hemoptysis Ct chest requested and notable for Interstitial lung disease with bilateral peripheral ground-glass opacities in an upper lobe predominant distribution, with some radiographic improvement from remote prior CT. - also on Ct chest, patchy and ground-glass opacities throughout the right lower lobe, asymmetric involvement of interstitial lung disease versus superimposed acute abnormality such as infection - pt overall better but still with some quite uncomfortable coughing spells, would like to observe one more night   Sepsis secondary to RLL PNA, hemophilus influenzae  - pt did meet criteria for sepsis on admission with WBC 15K, RR > 20 and HR > 90 bpm - will continue Levaquin day #6/7, no fevers this AM - CBC in AM  Acute  kidney injury - lasix was given IV so suspect this has contributed - encouraged oral intake and Cr is now trending down  - BMP in AM  Acute on Chronic combined systolic and diastolic congestive heart failure - weight 202 lbs on admission --> 199 --> 198 --> 205 --> 201 lbs this AM - transitioned to oral Lasix 10/12, cardiology has singed off  - ok to keep off tele monitor as pt with no chest pain and no dyspnea, no palpitations, no dizziness  - continue to monitor daily weights, strict I/O  Obesity  - Body mass index is 35.81 kg/m.  DVT prophylaxis: Coumadin  Code Status: Full  Family Communication: Patient and family (husband) at bedside  Disposition Plan: possible d/c in AM   Consultants:   Cardiology   Procedures:   None  Antimicrobials:   Levaquin 10/11 -->  Subjective: Coughing improving but still rough and course breath sounds with coughing, feels better overall.  Objective: Vitals:   12/28/15 1500 12/28/15 2130 12/29/15 0536 12/29/15 0813  BP: 118/68 128/66 (!) 141/69   Pulse: 83 85 72 87  Resp: 19 19 16 20   Temp:  97.8 F (36.6 C) 97.5 F (36.4 C)   TempSrc:  Oral Axillary   SpO2: 94% 96% 91% 92%  Weight:   91.9 kg (202 lb 9.6 oz)   Height:        Intake/Output Summary (Last 24 hours) at 12/29/15 1138 Last data filed at 12/29/15 0536  Gross per 24 hour  Intake  0 ml  Output             2300 ml  Net            -2300 ml   Filed Weights   12/27/15 0612 12/28/15 0525 12/29/15 0536  Weight: 91.4 kg (201 lb 9.6 oz) 91.5 kg (201 lb 11.2 oz) 91.9 kg (202 lb 9.6 oz)    Examination:  General exam: Appears in mild distress due to pain   Respiratory system: overall better air movement but still with diminished breath sounds at bases and scattered rhonchi, very course breath sounds with coughing spells  Cardiovascular system: IRRRR. No JVD, rubs, gallops or clicks. No pedal edema. Gastrointestinal system: Abdomen is nondistended, soft and  nontender. No organomegaly Central nervous system: Alert and oriented. No focal neurological deficits. Extremities: Symmetric 5 x 5 power.  Data Reviewed: I have personally reviewed following labs and imaging studies  CBC:  Recent Labs Lab 12/24/15 1846 12/25/15 0152 12/26/15 0319 12/27/15 0331 12/28/15 0306  WBC 20.5* 17.3* 15.0* 11.9* 12.0*  HGB 13.9 13.5 13.2 13.6 14.2  HCT 41.4 40.9 40.1 39.4 42.3  MCV 94.7 95.6 94.6 93.8 94.0  PLT 167 179 196 215 761   Basic Metabolic Panel:  Recent Labs Lab 12/23/15 1340 12/25/15 0152 12/26/15 0319 12/27/15 0331 12/28/15 0306  NA 136 134* 135 137 135  K 4.8 4.5 4.2 3.9 3.9  CL 100* 99* 99* 100* 98*  CO2 25 27 26 27 25   GLUCOSE 119* 161* 160* 184* 138*  BUN 20 23* 37* 39* 37*  CREATININE 1.44* 1.53* 1.79* 1.46* 1.33*  CALCIUM 9.8 9.3 9.5 9.4 9.3   Coagulation Profile:  Recent Labs Lab 12/25/15 0152 12/26/15 0319 12/27/15 0331 12/28/15 0306 12/29/15 0312  INR 1.69 1.70 2.36 3.59 3.75   Thyroid Function Tests: No results for input(s): TSH, T4TOTAL, FREET4, T3FREE, THYROIDAB in the last 72 hours. Recent Results (from the past 240 hour(s))  MRSA PCR Screening     Status: None   Collection Time: 12/23/15  9:00 PM  Result Value Ref Range Status   MRSA by PCR NEGATIVE NEGATIVE Final    Comment:        The GeneXpert MRSA Assay (FDA approved for NASAL specimens only), is one component of a comprehensive MRSA colonization surveillance program. It is not intended to diagnose MRSA infection nor to guide or monitor treatment for MRSA infections.   Culture, blood (routine x 2) Call MD if unable to obtain prior to antibiotics being given     Status: None (Preliminary result)   Collection Time: 12/24/15  4:42 PM  Result Value Ref Range Status   Specimen Description BLOOD RIGHT HAND  Final   Special Requests IN PEDIATRIC BOTTLE 2CC  Final   Culture NO GROWTH 4 DAYS  Final   Report Status PENDING  Incomplete  Culture,  sputum-assessment     Status: None   Collection Time: 12/24/15  6:11 PM  Result Value Ref Range Status   Specimen Description SPUTUM  Final   Special Requests NONE  Final   Sputum evaluation   Final    THIS SPECIMEN IS ACCEPTABLE. RESPIRATORY CULTURE REPORT TO FOLLOW.   Report Status 12/24/2015 FINAL  Final  Culture, respiratory (NON-Expectorated)     Status: None   Collection Time: 12/24/15  6:11 PM  Result Value Ref Range Status   Specimen Description SPUTUM  Final   Special Requests NONE  Final   Gram Stain   Final  ABUNDANT WBC PRESENT,BOTH PMN AND MONONUCLEAR RARE GRAM POSITIVE COCCI IN PAIRS RARE GRAM NEGATIVE RODS    Culture   Final    ABUNDANT HAEMOPHILUS INFLUENZAE BETA LACTAMASE NEGATIVE    Report Status 12/27/2015 FINAL  Final      Radiology Studies: Ct Chest Wo Contrast Result Date: 12/25/2015 Interstitial lung disease with bilateral peripheral ground-glass opacities in an upper lobe predominant distribution, with some radiographic improvement from remote prior CT. 2. Patchy and ground-glass opacities throughout the right lower lobe, asymmetric involvement of interstitial lung disease versus superimposed acute abnormality such as infection. Pulmonary edema is felt less likely given distribution and he in the lateral involvement. 3. Cardiomegaly with coronary artery calcifications, atherosclerosis and tortuosity of the thoracic aorta. 4. Mild mediastinal adenopathy which may be reactive.     Scheduled Meds: . allopurinol  300 mg Oral Daily  . aspirin EC  81 mg Oral Daily  . azelastine  2 spray Each Nare BID  . cycloSPORINE  1 drop Both Eyes BID  . digoxin  250 mcg Oral Daily  . diltiazem  120 mg Oral Daily  . erythromycin  1 application Both Eyes QHS  . fluconazole  150 mg Oral Q Wed  . furosemide  40 mg Oral Daily  . gabapentin  300 mg Oral TID  . guaiFENesin  600 mg Oral BID  . levalbuterol  1.25 mg Nebulization TID  . [START ON 12/30/2015] levofloxacin   750 mg Oral Q48H  . levothyroxine  50 mcg Oral QAC breakfast  . oxybutynin  5 mg Oral BID  . pravastatin  40 mg Oral q1800  . prednisoLONE acetate  1 drop Both Eyes TID  . predniSONE  50 mg Oral Q breakfast  . Warfarin - Pharmacist Dosing Inpatient   Does not apply q1800   Continuous Infusions:    LOS: 5 days   Time spent: 20 minutes   Faye Ramsay, MD [facility] Pager [phone]  If 7PM-7AM, please contact night-coverage [email or web address] Password [email or web address] 12/29/2015, 11:38 AM

## 2015-12-29 NOTE — Consult Note (Signed)
[facility] CM Primary Care Navigator  12/29/2015  Katie [name] 10-01-1940 [id number]  Met with patient at the bedside to identify possible discharge needs. Patient shares feeling chest pressure, difficulty breathing, increased sweating and coughed out blood were reasons that led to her admission. Patient endorses Dr. [name] as the primary care provider.    Patient states using [facility] at [facility] to obtain medications without difficulty. She mentions managing her own medicines straight from the containers when at home.   According to patient, she drives herself prior to admission. Her husband Nathaneil Canary) or daughter Kirke Corin.) can provide transportation to her doctors' appointments if needed. Patient states that daughter (lives close by) is her primary caregiver at home and husband can assist with her needs as needed.   Patient voiced understanding to call primary care provider's office for a post discharge follow-up appointment within a week or sooner if needs arise. Patient letter given as her reminder. She also mentioned of need to follow-up with cardiologist and pulmonologist.  Patient refused referral to [facility] care management for disease management and education on HF and COPD. She denies further needs or concerns at this time.   For additional questions please contact:  Edwena Felty A. Paisleigh Maroney, BSN, RN-BC [facility] PRIMARY CARE Navigator Cell: [phone]

## 2015-12-29 NOTE — Care Management Important Message (Signed)
Important Message  Patient Details  Name: Katie [name] MRN: [id number] Date of Birth: [birth year]   Medicare Important Message Given:  Yes    [name] 12/29/2015, 1:32 PM

## 2015-12-29 NOTE — Progress Notes (Signed)
ANTICOAGULATION CONSULT NOTE - Follow Up Consult  Pharmacy Consult for Coumadin Indication: atrial fibrillation  Allergies  Allergen Reactions  . Advair Diskus [Fluticasone-Salmeterol] Other (See Comments)    SKIN CHANGES.  Can tolerate low dose of prednisone without complications  . Amiodarone Other (See Comments)    PULMONARY TOXICITY  . Codeine     unknown  . Diovan [Valsartan] Other (See Comments)    HYPOTENSION  . Doxycycline Diarrhea and Other (See Comments)    VISUAL DISTURBANCE  . Flexeril [Cyclobenzaprine] Other (See Comments)    FATIGUE  . Keflex [Cephalexin] Diarrhea  . Verapamil Other (See Comments)    EDEMA    Patient Measurements: Height: 5\' 3"  (160 cm) Weight: 202 lb 9.6 oz (91.9 kg) IBW/kg (Calculated) : 52.4  Vital Signs: Temp: 97.5 F (36.4 C) (10/16 0536) Temp Source: Axillary (10/16 0536) BP: 141/69 (10/16 0536) Pulse Rate: 87 (10/16 0813)  Labs:  Recent Labs  12/27/15 0331 12/28/15 0306 12/29/15 0312  HGB 13.6 14.2  --   HCT 39.4 42.3  --   PLT 215 268  --   LABPROT 26.2* 36.7* 38.0*  INR 2.36 3.59 3.75  CREATININE 1.46* 1.33*  --     Estimated Creatinine Clearance: 40.6 mL/min (by C-G formula based on SCr of 1.33 mg/dL (H)).   Assessment:  Anticoag: Warf pta - INR on admit 2.54 (see Rx note 10/13). Last dose 10/10 PTA. V/Q scan: very low probability of PE. INR today 3.75 large jump. H/H stable. Some hemoptysis noted with this respiratory illness.. Drug interaction with Levaquin - PTA dose 2mg  daily  Goal of Therapy:  INR 2-3 Monitor platelets by anticoagulation protocol: Yes   Plan:  -Hold Coumadin -Daily INR  Thank you Anette Guarneri, PharmD [phone]   12/29/2015,9:58 AM

## 2015-12-29 NOTE — Progress Notes (Signed)
Physical Therapy Treatment Patient Details Name: Katie [name] MRN: [id number] DOB: 1941-01-12 Today's Date: 12/29/2015    History of Present Illness This is a 75 y.o. female with a past medical history significant for permanent atrial fibrillation, systolic and diastolic heart failure with a history of class II symptoms, history of AAA status post repair, mild to moderate aortic stenosis, history of atrial flutter and coronary artery disease. She also has a 50 year smoking history and likely has COPD however it is not described in her medical history. 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 She presents with dyspnea and tachycardia and over the past week her symptoms have worsened significantly. Today she could barely walk across the room without being short of breath. She was found to be in A. fib with RVR with heart rate in the 140s.     PT Comments    Pt doing well with mobility and no further PT needed.  Ready for dc from PT standpoint. Encouraged pt to return to community exercise classes at [facility] and to continue to work on repeated sit to stand ex at home.    Follow Up Recommendations  No PT follow up     Equipment Recommendations  None recommended by PT    Recommendations for Other Services       Precautions / Restrictions Precautions Precautions: Fall Restrictions Weight Bearing Restrictions: No    Mobility  Bed Mobility Overal bed mobility: Independent                Transfers Overall transfer level: Independent Equipment used: None Transfers: Sit to/from Stand Sit to Stand: Independent            Ambulation/Gait Ambulation/Gait assistance: Modified independent (Device/Increase time) Ambulation Distance (Feet): 200 Feet Assistive device: None Gait Pattern/deviations: Step-through pattern;Decreased stride length;Wide base of support Gait velocity: decr Gait velocity interpretation: Below normal speed for  age/gender General Gait Details: Steady gait. Dyspnea 2/4 on RA.    Stairs Stairs: Yes Stairs assistance: Modified independent (Device/Increase time) Stair Management: One rail Right;Alternating pattern;Step to pattern;Forwards Number of Stairs: 4 General stair comments: alternating pattern up and step to pattern down. Standing rest x 1 minute after completion  Wheelchair Mobility    Modified Rankin (Stroke Patients Only)       Balance Overall balance assessment: Modified Independent                                  Cognition Arousal/Alertness: Awake/alert Behavior During Therapy: WFL for tasks assessed/performed Overall Cognitive Status: Within Functional Limits for tasks assessed                      Exercises Other Exercises Other Exercises: Performed repeated sit to stand x 3 from chair.    General Comments        Pertinent Vitals/Pain      Home Living                      Prior Function            PT Goals (current goals can now be found in the care plan section) Progress towards PT goals: Goals met/education completed, patient discharged from PT    Frequency           PT Plan Discharge plan needs to be updated    Co-evaluation  End of Session   Activity Tolerance: Patient tolerated treatment well Patient left: with call bell/phone within reach;in bed (sitting EOB)     Time: 4159-3012 PT Time Calculation (min) (ACUTE ONLY): 10 min  Charges:  $Gait Training: 8-22 mins                    G Codes:      Katie [name] 11-Jan-2016, 10:40 AM [employer] PT [phone]

## 2015-12-29 NOTE — Progress Notes (Signed)
Physical Therapy Discharge Patient Details Name: Katie [name] MRN: [id number] DOB: May 11, 1940 Today's Date: 12/29/2015 Time: 0920-0930 PT Time Calculation (min) (ACUTE ONLY): 10 min  Patient discharged from PT services secondary to goals met and no further PT needs identified.  Please see latest therapy progress note for current level of functioning and progress toward goals.    Progress and discharge plan discussed with patient and/or caregiver: Patient/Caregiver agrees with plan  GP     [facility] 12/29/2015, 10:41 AM  Suanne Marker PT [phone]

## 2015-12-30 ENCOUNTER — Other Ambulatory Visit: Payer: Self-pay | Admitting: Internal Medicine

## 2015-12-30 ENCOUNTER — Inpatient Hospital Stay (HOSPITAL_COMMUNITY): Payer: Medicare Other

## 2015-12-30 LAB — PROTIME-INR
INR: 3.29
Prothrombin Time: 34.2 seconds — ABNORMAL HIGH (ref 11.4–15.2)

## 2015-12-30 MED ORDER — FUROSEMIDE 40 MG PO TABS: 40.0000 mg | ORAL_TABLET | Freq: Every day | ORAL | 0 refills | Status: DC

## 2015-12-30 MED ORDER — GUAIFENESIN-DM 100-10 MG/5ML PO SYRP: 5.0000 mL | ORAL_SOLUTION | ORAL | 0 refills | Status: DC | PRN

## 2015-12-30 MED ORDER — DILTIAZEM HCL ER COATED BEADS 120 MG PO CP24: 120.0000 mg | ORAL_CAPSULE | Freq: Every day | ORAL | 0 refills | Status: DC

## 2015-12-30 MED ORDER — PREDNISONE 10 MG PO TABS: ORAL_TABLET | ORAL | 0 refills | Status: DC

## 2015-12-30 MED ORDER — GUAIFENESIN ER 600 MG PO TB12: 600.0000 mg | ORAL_TABLET | Freq: Two times a day (BID) | ORAL | 0 refills | Status: DC

## 2015-12-30 MED ORDER — OXYBUTYNIN CHLORIDE 5 MG PO TABS: 5.0000 mg | ORAL_TABLET | Freq: Two times a day (BID) | ORAL | 0 refills | Status: DC

## 2015-12-30 MED ORDER — MAGNESIUM HYDROXIDE 400 MG/5ML PO SUSP: 30.0000 mL | Freq: Every day | ORAL | Status: DC | PRN

## 2015-12-30 MED ORDER — LEVOFLOXACIN 750 MG PO TABS: 750.0000 mg | ORAL_TABLET | ORAL | 0 refills | Status: DC

## 2015-12-30 MED ORDER — IPRATROPIUM BROMIDE 0.02 % IN SOLN: 0.5000 mg | Freq: Four times a day (QID) | RESPIRATORY_TRACT | 1 refills | Status: DC | PRN

## 2015-12-30 MED ORDER — MENTHOL 3 MG MT LOZG
1.0000 | LOZENGE | OROMUCOSAL | Status: DC | PRN
  Filled 2015-12-30: qty 9

## 2015-12-30 MED ORDER — LEVALBUTEROL HCL 1.25 MG/0.5ML IN NEBU: 1.2500 mg | INHALATION_SOLUTION | Freq: Four times a day (QID) | RESPIRATORY_TRACT | 3 refills | Status: DC | PRN

## 2015-12-30 NOTE — Progress Notes (Signed)
ANTICOAGULATION CONSULT NOTE - Follow Up Consult  Pharmacy Consult for Coumadin Indication: atrial fibrillation  Allergies  Allergen Reactions  . Advair Diskus [Fluticasone-Salmeterol] Other (See Comments)    SKIN CHANGES.  Can tolerate low dose of prednisone without complications  . Amiodarone Other (See Comments)    PULMONARY TOXICITY  . Codeine     unknown  . Diovan [Valsartan] Other (See Comments)    HYPOTENSION  . Doxycycline Diarrhea and Other (See Comments)    VISUAL DISTURBANCE  . Flexeril [Cyclobenzaprine] Other (See Comments)    FATIGUE  . Keflex [Cephalexin] Diarrhea  . Verapamil Other (See Comments)    EDEMA    Patient Measurements: Height: 5\' 3"  (160 cm) Weight: 201 lb 14.4 oz (91.6 kg) IBW/kg (Calculated) : 52.4  Vital Signs: Temp: 97.6 F (36.4 C) (10/17 0608) Temp Source: Oral (10/17 0608) BP: 153/60 (10/17 0608) Pulse Rate: 72 (10/17 0947)  Labs:  Recent Labs  12/28/15 0306 12/29/15 0312 12/30/15 0225  HGB 14.2  --   --   HCT 42.3  --   --   PLT 268  --   --   LABPROT 36.7* 38.0* 34.2*  INR 3.59 3.75 3.29  CREATININE 1.33*  --   --     Estimated Creatinine Clearance: 40.5 mL/min (by C-G formula based on SCr of 1.33 mg/dL (H)).   Assessment:  Anticoag: Warf pta - INR on admit 2.54 (see Rx note 10/13). Last dose 10/10 PTA. V/Q scan: very low probability of PE. INR today 3.22 trending down. H/H stable. Drug interaction with Levaquin - PTA dose 2mg  daily  Goal of Therapy:  INR 2-3 Monitor platelets by anticoagulation protocol: Yes   Plan:  -Hold Coumadin today - Resume Coumadin at home dose of 2 mg po daily on 10/18 (expect INR to trend down) -Daily INR  Thank you Anette Guarneri, PharmD [phone]   12/30/2015,10:03 AM

## 2015-12-30 NOTE — Discharge Summary (Signed)
Physician Discharge Summary  Katie [name] FWY:[id number] DOB: 01-Apr-1940 DOA: 12/23/2015  PCP: [name], MD  Admit date: 12/23/2015 Discharge date: 12/30/2015  Recommendations for Outpatient Follow-up:  1. Pt will need to follow up with PCP in 1-2 weeks post discharge 2. Please obtain BMP to evaluate electrolytes and kidney function 3. Please also check CBC to evaluate Hg and Hct levels 4. Please note that pt will need repeat chest imaging study to ensure resolution of PNA  5. Pt needs to complete therapy with Levaquin 6. Please also note that Ziac and Benazepril have been removed from pt's medical list as pt said she no longer takes those medications  7. Please note that cardiology started Cardizem, Metolazone has been discontinued   Discharge Diagnoses:  Active Problems:   Atrial fibrillation [facility])  Discharge Condition: Stable  Diet recommendation: Heart healthy diet discussed in details   Brief Narrative:  75 y.o.female,with past medical history of COPD, hypertension, atrial fibrillation on Warfarin,nonischemic cardiomyopathy, nonobstructive coronary artery disease patient presents with complaints of palpitation and shortness of breath this a.m.reports Has been having progressive dyspnea over few month, but then significantly worsened over last week.  Assessment & Plan:   Permanent A. fib (CHADSVASC 5) with rapid ventricular response - currently on cardizem for rate control and it seems to be working well as HR has been stable and at target range  - also on digoxin and coumadin  - appreciate cardiology team assistance, they have signed off as no further recommendations   Acute hypoxic respiratory failure secondary to likely COPD exacerbation and RLL PNA, H. Influenzae - pt placed on Levaquin IV, transitioned to oral Levaquin  - sputum cultures with bacteria H. Influenzae and FQ should be adequate in coverage  - treat COPD with bronchodilators and  steroids - has been successfully tapered off oxygen  - due to hemoptysis Ct chest requested and notable for Interstitial lung disease with bilateral peripheral ground-glass opacities in an upper lobe predominant distribution, with some radiographic improvement from remote prior CT. - also on Ct chest, patchy and ground-glass opacities throughout the right lower lobe, asymmetric involvement of interstitial lung disease versus superimposed acute abnormality such as infection - pt overall better, air movement noted bilaterally but still with coughing spells   Sepsis secondary to RLL PNA, hemophilus influenzae  - pt did meet criteria for sepsis on admission with WBC 15K, RR > 20 and HR > 90 bpm - will continue Levaquin to complete therapy upon discharge, extended coverage for several more days post discharge as pt still with significant coughing spells   Acute kidney injury - lasix was given IV so suspect this has contributed - encouraged oral intake and Cr is now trending down   Acute on Chronic combined systolic and diastolic congestive heart failure - weight 202 lbs on admission --> 199 --> 198 --> 205 --> 201 lbs this AM - transitioned to oral Lasix 10/12, cardiology has singed off   Obesity  - Body mass index is 35.81 kg/m.  DVT prophylaxis: Coumadin  Code Status: Full  Family Communication: Patient and family (husband) at bedside  Disposition Plan: Home   Consultants:   Cardiology   Procedures:   None  Antimicrobials:   Levaquin 10/11 -->   Procedures/Studies: Dg Chest 2 View  Result Date: 12/30/2015 CLINICAL DATA:  Shortness of breath. EXAM: CHEST  2 VIEW COMPARISON:  CT 12/25/2015.  Chest x-ray FINDINGS: With mediastinum hilar structures normal. Cardiomegaly with normal pulmonary vascularity. Persistent but  improving right lower lobe infiltrate. COPD. No pleural effusion or pneumothorax. IMPRESSION: 1.  Persistent but improving right lower lobe infiltrate. 2.   Stable cardiomegaly. Electronically Signed   By: Marcello Moores  Register   On: 12/30/2015 10:45   Ct Chest Wo Contrast  Result Date: 12/25/2015 CLINICAL DATA:  Dyspnea and hemoptysis. EXAM: CT CHEST WITHOUT CONTRAST TECHNIQUE: Multidetector CT imaging of the chest was performed following the standard protocol without IV contrast. COMPARISON:  Multiple prior radiographs most recently yesterday. Chest CT 04/23/2004 FINDINGS: Cardiovascular: Diffuse thoracic aortic atherosclerosis. Ectasia of the aortic arch and descending aorta without frank aneurysm. Dense coronary artery calcifications. Multi chamber cardiomegaly. Mediastinum/Nodes: Mild mediastinal adenopathy with lower paratracheal nodes measuring up to 13 mm. Limited assessment for hilar adenopathy given lack of contrast. Physiologic fluid in the superior pericardial recess. Lungs/Pleura: Mild emphysema. Ground-glass and patchy opacities throughout the right lower lobe, with slightly more confluent nodular component superior centrally. Scattered peripheral subpleural ground-glass opacity with architectural distortion, upper lobe predominant, with improvement from remote prior chest CT. No bronchiectasis. Minimal pleural thickening in the right lung without frank effusion. Upper Abdomen: Hepatic steatosis.  No acute abnormality. Musculoskeletal: There are no acute or suspicious osseous abnormalities. Large peripherally calcified disc osteophyte in the midthoracic spine causes narrowing of the spinal canal, this is similar to remote prior CT. IMPRESSION: 1. Interstitial lung disease with bilateral peripheral ground-glass opacities in an upper lobe predominant distribution, with some radiographic improvement from remote prior CT. 2. Patchy and ground-glass opacities throughout the right lower lobe, asymmetric involvement of interstitial lung disease versus superimposed acute abnormality such as infection. Pulmonary edema is felt less likely given distribution and he  in the lateral involvement. 3. Cardiomegaly with coronary artery calcifications, atherosclerosis and tortuosity of the thoracic aorta. 4. Mild mediastinal adenopathy which may be reactive. Electronically Signed   By: Jeb Levering M.D.   On: 12/25/2015 21:27   Nm Pulmonary Perf And Vent  Result Date: 12/23/2015 CLINICAL DATA:  Shortness of breath, AFib EXAM: NUCLEAR MEDICINE VENTILATION - PERFUSION LUNG SCAN TECHNIQUE: Ventilation images were obtained in multiple projections using inhaled aerosol Tc-4m DTPA. Perfusion images were obtained in multiple projections after intravenous injection of Tc-39m MAA. RADIOPHARMACEUTICALS:  31 mCi Technetium-59m DTPA aerosol inhalation and 3.2 mCi Technetium-97m MAA IV COMPARISON:  Chest x-ray 12/23/2015 FINDINGS: Ventilation: Heterogenous bilateral ventilation. Digestive tract activity is noted. Perfusion: No wedge shaped peripheral perfusion defects to suggest acute pulmonary embolism. The heart is enlarged. IMPRESSION: Very low probability examination for PE. Electronically Signed   By: Donavan Foil M.D.   On: 12/23/2015 21:02   Dg Chest Port 1 View  Result Date: 12/24/2015 CLINICAL DATA:  Rales.  Chest pain and leukocytosis EXAM: PORTABLE CHEST 1 VIEW COMPARISON:  Yesterday FINDINGS: Unchanged cardiopericardial enlargement and aortic tortuosity with vascular pedicle widening. Cephalized blood flow and diffuse interstitial opacity. There is asymmetric opacity at the right base, persisting from yesterday. No effusion or pneumothorax. IMPRESSION: 1. Cardiomegaly and mild pulmonary edema. 2. Possible superimposed pneumonia at the right base. Electronically Signed   By: Monte Fantasia M.D.   On: 12/24/2015 13:17   Dg Chest Port 1 View  Result Date: 12/23/2015 CLINICAL DATA:  Right-sided chest pain and increasing shortness of breath EXAM: PORTABLE CHEST 1 VIEW COMPARISON:  Chest radiograph 12/23/2015 at 2:37 p.m. FINDINGS: Unchanged cardiomegaly with aortic  arch atherosclerosis. There are increased opacities in the right lung base, likely pulmonary edema. No other area of consolidation. No pneumothorax or pleural effusion. IMPRESSION:  Worsening opacities in the right lung base, likely pulmonary edema. Electronically Signed   By: Ulyses Jarred M.D.   On: 12/23/2015 22:51   Dg Chest Portable 1 View  Result Date: 12/23/2015 CLINICAL DATA:  Arrhythmia. Spitting up blood. Atrial fibrillation. EXAM: PORTABLE CHEST 1 VIEW COMPARISON:  03/18/2014 FINDINGS: Cardiac silhouette is enlarged. Atherosclerotic calcifications at the aortic arch. Lungs are essentially clear without pulmonary edema. No large pleural effusions. No acute bone abnormality. IMPRESSION: Cardiomegaly without focal airspace disease or pulmonary edema. Aortic atherosclerosis. Electronically Signed   By: Markus Daft M.D.   On: 12/23/2015 14:46     Discharge Exam: Vitals:   12/30/15 0608 12/30/15 0947  BP: (!) 153/60   Pulse: (!) 47 72  Resp: 18   Temp: 97.6 F (36.4 C)    Vitals:   12/29/15 2034 12/30/15 0500 12/30/15 0608 12/30/15 0947  BP: (!) 111/58  (!) 153/60   Pulse: 88  (!) 47 72  Resp: 18  18   Temp: 97.7 F (36.5 C)  97.6 F (36.4 C)   TempSrc: Oral  Oral   SpO2: 94%  93%   Weight:  91.6 kg (201 lb 14.4 oz)    Height:        General: Pt is alert, follows commands appropriately, not in acute distress Cardiovascular: Regular rate and rhythm, S1/S2 +, no murmurs, no rubs, no gallops Respiratory: Clear to auscultation bilaterally, no wheezing, diminished breath sounds at bases with scattered rhonchi  Abdominal: Soft, non tender, non distended, bowel sounds +, no guarding Extremities: no edema, no cyanosis, pulses palpable bilaterally DP and PT   Discharge Instructions  Discharge Instructions    Diet - low sodium heart healthy    Complete by:  As directed    Increase activity slowly    Complete by:  As directed        Medication List    STOP taking these  medications   benazepril 20 MG tablet Commonly known as:  LOTENSIN   bisoprolol-hydrochlorothiazide 5-6.25 MG tablet Commonly known as:  ZIAC   ipratropium-albuterol 0.5-2.5 (3) MG/3ML Soln Commonly known as:  DUONEB   metolazone 2.5 MG tablet Commonly known as:  ZAROXOLYN     TAKE these medications   albuterol 108 (90 Base) MCG/ACT inhaler Commonly known as:  PROVENTIL HFA;VENTOLIN HFA 2 puffs 5 minutes apart every 4 to 6 hours as needed to rescue asthma What changed:  additional instructions   allopurinol 300 MG tablet Commonly known as:  ZYLOPRIM TAKE 1 TABLET BY MOUTH EVERY DAY   aspirin 81 MG tablet Take 81 mg by mouth daily.   azelastine 0.1 % nasal spray Commonly known as:  ASTELIN Place 2 sprays into both nostrils 2 (two) times daily. Use in each nostril as directed   azithromycin 1 % ophthalmic solution Commonly known as:  AZASITE Apply 1 drop to eye at bedtime. Apply to both eye lids   Cetirizine HCl 10 MG Caps Take 1 capsule (10 mg total) by mouth at bedtime.   digoxin 0.25 MG tablet Commonly known as:  LANOXIN TAKE 1 TABLET BY MOUTH DAILY   diltiazem 120 MG 24 hr capsule Commonly known as:  CARDIZEM CD Take 1 capsule (120 mg total) by mouth daily. Start taking on:  12/31/2015   erythromycin ophthalmic ointment Place 1 application into both eyes at bedtime.   fluconazole 150 MG tablet Commonly known as:  DIFLUCAN Take 150 mg by mouth See admin instructions. One tablet by mouth every  week   furosemide 40 MG tablet Commonly known as:  LASIX Take 1 tablet (40 mg total) by mouth daily. What changed:  medication strength  how much to take  how to take this  when to take this  additional instructions   gabapentin 300 MG capsule Commonly known as:  NEURONTIN TAKE ONE CAPSULE BY MOUTH 3 TIMES A DAY   guaiFENesin 600 MG 12 hr tablet Commonly known as:  MUCINEX Take 1 tablet (600 mg total) by mouth 2 (two) times daily.    guaiFENesin-dextromethorphan 100-10 MG/5ML syrup Commonly known as:  ROBITUSSIN DM Take 5 mLs by mouth every 4 (four) hours as needed for cough.   ipratropium 0.02 % nebulizer solution Commonly known as:  ATROVENT Take 2.5 mLs (0.5 mg total) by nebulization every 6 (six) hours as needed for wheezing or shortness of breath. What changed:  when to take this   levalbuterol 1.25 MG/0.5ML nebulizer solution Commonly known as:  XOPENEX Take 1.25 mg by nebulization every 6 (six) hours as needed for wheezing or shortness of breath.   levofloxacin 750 MG tablet Commonly known as:  LEVAQUIN Take 1 tablet (750 mg total) by mouth every other day. Start taking on:  01/01/2016   levothyroxine 50 MCG tablet Commonly known as:  SYNTHROID, LEVOTHROID Take 50 mcg by mouth daily.   losartan 100 MG tablet Commonly known as:  COZAAR Take 100 mg by mouth daily.   meclizine 25 MG tablet Commonly known as:  ANTIVERT Take 1 tablet (25 mg total) by mouth 3 (three) times daily as needed for dizziness.   montelukast 10 MG tablet Commonly known as:  SINGULAIR TAKE 1 TABLET EVERY DAY FOR ALLERGIES   oxybutynin 5 MG tablet Commonly known as:  DITROPAN Take 1 tablet (5 mg total) by mouth 2 (two) times daily.   potassium chloride SA 20 MEQ tablet Commonly known as:  K-DUR,KLOR-CON Take 2 tablets (40 mEq total) by mouth daily.   pravastatin 40 MG tablet Commonly known as:  PRAVACHOL TAKE 1 TABLET BY MOUTH AT BEDTIME FOR CHOLESTEROL   prednisoLONE acetate 1 % ophthalmic suspension Commonly known as:  PRED FORTE 1 drop See admin instructions. 1 drop 4 times a day for 1 week, 3 times a day for 1 week, twice a day for 1 week, once a day for 1 week   predniSONE 10 MG tablet Commonly known as:  DELTASONE Take 20 mg tablet 10/18, take 10 mg tablet on 10/19 and then stop   prochlorperazine 5 MG tablet Commonly known as:  COMPAZINE TAKE 1 TABLET BY MOUTH 3 TIMES A DAY FOR VERTIGO OR NAUSEA    warfarin 2 MG tablet Commonly known as:  COUMADIN TAKE 1 TO 2 TABLETS BY MOUTH DAILY OR AS DIRECTED      Follow-up Information    MCKEOWN,WILLIAM DAVID, MD. Schedule an appointment as soon as possible for a visit today.   Specialty:  Internal Medicine Contact information: [address] [place] [address] [phone]        Faye Ramsay, MD. Call today.   Specialty:  Internal Medicine Why:  call as needed my cell [phone] Dr. Mellody Memos information: [address] [place] [address] [phone]            The results of significant diagnostics from this hospitalization (including imaging, microbiology, ancillary and laboratory) are listed below for reference.     Microbiology: Recent Results (from the past 240 hour(s))  MRSA PCR  Screening     Status: None   Collection Time: 12/23/15  9:00 PM  Result Value Ref Range Status   MRSA by PCR NEGATIVE NEGATIVE Final  Culture, blood (routine x 2) Call MD if unable to obtain prior to antibiotics being given     Status: None   Collection Time: 12/24/15  4:42 PM  Result Value Ref Range Status   Specimen Description BLOOD RIGHT HAND  Final   Special Requests IN PEDIATRIC BOTTLE 2CC  Final   Culture NO GROWTH 5 DAYS  Final   Report Status 12/29/2015 FINAL  Final  Culture, sputum-assessment     Status: None   Collection Time: 12/24/15  6:11 PM  Result Value Ref Range Status   Specimen Description SPUTUM  Final   Special Requests NONE  Final   Sputum evaluation   Final    THIS SPECIMEN IS ACCEPTABLE. RESPIRATORY CULTURE REPORT TO FOLLOW.   Report Status 12/24/2015 FINAL  Final  Culture, respiratory (NON-Expectorated)     Status: None   Collection Time: 12/24/15  6:11 PM  Result Value Ref Range Status   Specimen Description SPUTUM  Final   Special Requests NONE  Final   Gram Stain   Final    ABUNDANT WBC PRESENT,BOTH PMN AND MONONUCLEAR RARE GRAM POSITIVE COCCI IN  PAIRS RARE GRAM NEGATIVE RODS    Culture   Final    ABUNDANT HAEMOPHILUS INFLUENZAE BETA LACTAMASE NEGATIVE    Report Status 12/27/2015 FINAL  Final    Labs: Basic Metabolic Panel:  Recent Labs Lab 12/23/15 1340 12/25/15 0152 12/26/15 0319 12/27/15 0331 12/28/15 0306  NA 136 134* 135 137 135  K 4.8 4.5 4.2 3.9 3.9  CL 100* 99* 99* 100* 98*  CO2 25 27 26 27 25   GLUCOSE 119* 161* 160* 184* 138*  BUN 20 23* 37* 39* 37*  CREATININE 1.44* 1.53* 1.79* 1.46* 1.33*  CALCIUM 9.8 9.3 9.5 9.4 9.3   CBC:  Recent Labs Lab 12/24/15 1846 12/25/15 0152 12/26/15 0319 12/27/15 0331 12/28/15 0306  WBC 20.5* 17.3* 15.0* 11.9* 12.0*  HGB 13.9 13.5 13.2 13.6 14.2  HCT 41.4 40.9 40.1 39.4 42.3  MCV 94.7 95.6 94.6 93.8 94.0  PLT 167 179 196 215 268   BNP (last 3 results)  Recent Labs  12/23/15 1506  BNP 282.2*    CBG:  Recent Labs Lab 12/25/15 1152  GLUCAP 145*    SIGNED: Time coordinating discharge: 30 minutes  MAGICK-MYERS, ISKRA, MD  [facility] 12/30/2015, 11:38 AM Pager [phone]  If 7PM-7AM, please contact night-coverage [email or web address] Password [email or web address]

## 2015-12-30 NOTE — Care Management Note (Signed)
Case Management Note Marvetta Gibbons RN, BSN Unit 2W-Case Manager [phone]  Patient Details  Name: Katie [name] MRN: [id number] Date of Birth: 16-May-1940  Subjective/Objective:  Pt admitted with afib and PNA                  Action/Plan: PTA pt lived at home with spouse- daughter nearby, plan to return home with spouse- no CM needs noted.   Expected Discharge Date:    12/30/15              Expected Discharge Plan:  Home/Self Care  In-House Referral:     Discharge planning Services  CM Consult  Post Acute Care Choice:    Choice offered to:     DME Arranged:    DME Agency:     HH Arranged:    HH Agency:     Status of Service:  Completed, signed off  If discussed at [place] of Stay Meetings, dates discussed:    Discharge Disposition: Home with self care   Additional Comments:  Dawayne Patricia, RN 12/30/2015, 11:51 AM

## 2015-12-30 NOTE — Progress Notes (Signed)
Patient D/C home with husband. IV removed. D/C education done. Patient no further questions at this time. Patient belongings given to patient. Patient to D/C home with husband.  Domingo Dimes RN

## 2016-01-02 ENCOUNTER — Ambulatory Visit (INDEPENDENT_AMBULATORY_CARE_PROVIDER_SITE_OTHER): Payer: Medicare Other | Admitting: Internal Medicine

## 2016-01-02 VITALS — BP 100/64 | HR 68 | Temp 97.7°F | Resp 16 | Ht 63.0 in | Wt 197.8 lb

## 2016-01-02 DIAGNOSIS — I4891 Unspecified atrial fibrillation: Secondary | ICD-10-CM

## 2016-01-02 DIAGNOSIS — J181 Lobar pneumonia, unspecified organism: Secondary | ICD-10-CM | POA: Diagnosis not present

## 2016-01-02 DIAGNOSIS — J441 Chronic obstructive pulmonary disease with (acute) exacerbation: Secondary | ICD-10-CM | POA: Diagnosis not present

## 2016-01-02 DIAGNOSIS — Z7901 Long term (current) use of anticoagulants: Secondary | ICD-10-CM | POA: Diagnosis not present

## 2016-01-02 DIAGNOSIS — Z79899 Other long term (current) drug therapy: Secondary | ICD-10-CM | POA: Diagnosis not present

## 2016-01-02 DIAGNOSIS — J189 Pneumonia, unspecified organism: Secondary | ICD-10-CM

## 2016-01-02 LAB — BASIC METABOLIC PANEL WITH GFR
BUN: 41 mg/dL — ABNORMAL HIGH (ref 7–25)
CO2: 25 mmol/L (ref 20–31)
Calcium: 9.1 mg/dL (ref 8.6–10.4)
Chloride: 102 mmol/L (ref 98–110)
Creat: 1.5 mg/dL — ABNORMAL HIGH (ref 0.60–0.93)
GFR, EST AFRICAN AMERICAN: 40 mL/min — AB (ref >=60)
GFR, EST NON AFRICAN AMERICAN: 34 mL/min — AB (ref >=60)
GLUCOSE: 71 mg/dL (ref 65–99)
POTASSIUM: 5 mmol/L (ref 3.5–5.3)
SODIUM: 137 mmol/L (ref 135–146)

## 2016-01-02 LAB — MAGNESIUM: MAGNESIUM: 2.2 mg/dL (ref 1.5–2.5)

## 2016-01-02 LAB — CBC WITH DIFFERENTIAL/PLATELET
BASOS PCT: 0 %
Basophils Absolute: 0 cells/uL (ref 0–200)
EOS PCT: 1 %
Eosinophils Absolute: 164 cells/uL (ref 15–500)
HEMATOCRIT: 49.4 % — AB (ref 35.0–45.0)
Hemoglobin: 16.8 g/dL — ABNORMAL HIGH (ref 11.7–15.5)
LYMPHS ABS: 2624 {cells}/uL (ref 850–3900)
LYMPHS PCT: 16 %
MCH: 32.1 pg (ref 27.0–33.0)
MCHC: 34 g/dL (ref 32.0–36.0)
MCV: 94.3 fL (ref 80.0–100.0)
MONO ABS: 1148 {cells}/uL — AB (ref 200–950)
MPV: 9.8 fL (ref 7.5–12.5)
Monocytes Relative: 7 %
Neutro Abs: 12464 cells/uL — ABNORMAL HIGH (ref 1500–7800)
Neutrophils Relative %: 76 %
PLATELETS: 349 10*3/uL (ref 140–400)
RBC: 5.24 MIL/uL — AB (ref 3.80–5.10)
RDW: 15.1 % — AB (ref 11.0–15.0)
WBC: 16.4 10*3/uL — AB (ref 3.8–10.8)

## 2016-01-02 LAB — DIGOXIN LEVEL: DIGOXIN LVL: 2.2 ug/L — AB (ref 0.8–2.0)

## 2016-01-02 LAB — PROTIME-INR
INR: 2.4 — AB
PROTHROMBIN TIME: 24.4 s — AB (ref 9.0–11.5)

## 2016-01-04 ENCOUNTER — Encounter: Payer: Self-pay | Admitting: Internal Medicine

## 2016-01-04 NOTE — Progress Notes (Signed)
[facility] [name], M.D.        Uvaldo Bristle. Silverio Lay, P.A.-C       Starlyn Skeans, P.A.-C  [facility]                [address] [place], [place] [address] Telephone [phone] Telefax [phone] ______________________________________________________________________     This very nice 75 y.o. 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 Since hospitalization , she has improved with no significant sputum pdn or c/o dyspnea.  She has been followed also for Hypertension, cAfib, Hyperlipidemia, Pre-Diabetes and Vitamin D Deficiency.      Patient is treated for HTN & cAfib (1999)  & BP has been controlled at home. Today's BP is  100/64. Patient has had no complaints of any cardiac type chest pain, palpitations, dyspnea/orthopnea/PND, dizziness, claudication, or dependent edema.     Hyperlipidemia is controlled with diet & meds. Patient denies myalgias or other med SE's. Last Lipids were  Lab Results  Component Value Date   CHOL 137 12/09/2015   HDL 24 (L) 12/09/2015   LDLCALC 61 12/09/2015   TRIG 258 (H) 12/09/2015   CHOLHDL 5.7 (H) 12/09/2015      Also, the patient has history of Morbid Obesity  (BMI 35+) and consequent PreDiabetes and has had no symptoms of reactive hypoglycemia, diabetic polys, paresthesias or visual blurring.  Last A1c was  Lab Results  Component Value Date   HGBA1C 5.6 12/09/2015      Further, the patient also has history of Vitamin D Deficiency in 2008 of "33"  and supplements vitamin D without any suspected side-effects. Last vitamin D was   Lab Results  Component Value Date   VD25OH 73 12/09/2015   Current Outpatient Prescriptions on File Prior to Visit  Medication Sig  . allopurinol (ZYLOPRIM) 300 MG tablet TAKE 1 TABLET BY MOUTH EVERY DAY  . aspirin  81 MG tablet Take 81 mg by mouth daily.    Marland Kitchen azelastine (ASTELIN) 0.1 % nasal spray Place 2 sprays into both nostrils 2 (two) times daily. Use in each nostril as directed  . azithromycin (AZASITE) 1 % ophthalmic solution Apply 1 drop to eye at bedtime. Apply to both eye lids  . Cetirizine HCl 10 MG CAPS Take 1 capsule (10 mg total) by mouth at bedtime.  . digoxin (LANOXIN) 0.25 MG tablet TAKE 1 TABLET BY MOUTH DAILY  . diltiazem (CARDIZEM CD) 120 MG 24 hr capsule Take 1 capsule (120 mg total) by mouth daily.  Marland Kitchen erythromycin ophthalmic ointment Place 1 application into both eyes at bedtime.  . fluconazole (DIFLUCAN) 150 MG tablet Take 150 mg by mouth See admin instructions. One tablet by mouth every week  . furosemide (LASIX) 40 MG tablet Take 1 tablet (40 mg total) by mouth daily.  Marland Kitchen gabapentin (NEURONTIN) 300 MG capsule TAKE ONE CAPSULE BY MOUTH 3 TIMES A DAY  . guaiFENesin (MUCINEX) 600 MG 12 hr tablet Take 1 tablet (600 mg total) by mouth 2 (two) times daily.  Marland Kitchen guaiFENesin-dextromethorphan (ROBITUSSIN DM) 100-10 MG/5ML syrup Take 5 mLs by mouth every 4 (four) hours as needed for cough.  Marland Kitchen ipratropium (ATROVENT) 0.02 % nebulizer solution Take 2.5 mLs (0.5 mg total) by nebulization every  6 (six) hours as needed for wheezing or shortness of breath.  . levalbuterol (XOPENEX) 1.25 MG/0.5ML nebulizer solution Take 1.25 mg by nebulization every 6 (six) hours as needed for wheezing or shortness of breath.  . levofloxacin (LEVAQUIN) 750 MG tablet Take 1 tablet (750 mg total) by mouth every other day.  . levothyroxine (SYNTHROID, LEVOTHROID) 50 MCG tablet Take 50 mcg by mouth daily.  Marland Kitchen losartan (COZAAR) 100 MG tablet Take 100 mg by mouth daily.  . meclizine (ANTIVERT) 25 MG tablet Take 1 tablet (25 mg total) by mouth 3 (three) times daily as needed for dizziness.  . montelukast (SINGULAIR) 10 MG tablet TAKE 1 TABLET EVERY DAY FOR ALLERGIES  . oxybutynin (DITROPAN) 5 MG tablet Take 1 tablet (5 mg total)  by mouth 2 (two) times daily.  . potassium chloride SA (K-DUR,KLOR-CON) 20 MEQ tablet Take 2 tablets (40 mEq total) by mouth daily.  . pravastatin (PRAVACHOL) 40 MG tablet TAKE 1 TABLET BY MOUTH AT BEDTIME FOR CHOLESTEROL  . prednisoLONE acetate (PRED FORTE) 1 % ophthalmic suspension 1 drop See admin instructions. 1 drop 4 times a day for 1 week, 3 times a day for 1 week, twice a day for 1 week, once a day for 1 week  . predniSONE (DELTASONE) 10 MG tablet Take 20 mg tablet 10/18, take 10 mg tablet on 10/19 and then stop  . prochlorperazine (COMPAZINE) 5 MG tablet TAKE 1 TABLET BY MOUTH 3 TIMES A DAY FOR VERTIGO OR NAUSEA  . warfarin (COUMADIN) 2 MG tablet TAKE 1 TO 2 TABLETS BY MOUTH DAILY OR AS DIRECTED  . albuterol (PROVENTIL HFA;VENTOLIN HFA) 108 (90 BASE) MCG/ACT inhaler 2 puffs 5 minutes apart every 4 to 6 hours as needed to rescue asthma (Patient taking differently: Inhale 2 puffs into the lungs 5 minutes apart every 4 to 6 hours as needed to rescue asthma)   No current facility-administered medications on file prior to visit.    Allergies  Allergen Reactions  . Advair Diskus [Fluticasone-Salmeterol] Other (See Comments)    SKIN CHANGES.  Can tolerate low dose of prednisone without complications  . Amiodarone Other (See Comments)    PULMONARY TOXICITY  . Codeine     unknown  . Diovan [Valsartan] Other (See Comments)    HYPOTENSION  . Doxycycline Diarrhea and Other (See Comments)    VISUAL DISTURBANCE  . Flexeril [Cyclobenzaprine] Other (See Comments)    FATIGUE  . Keflex [Cephalexin] Diarrhea  . Verapamil Other (See Comments)    EDEMA   PMHx:   Past Medical History:  Diagnosis Date  . Aortic stenosis    mild AS witm mild to mod AR by 11/2012 echo  . Arthritis   . Atrial fibrillation ([place])   . Atrial flutter ([place])   . Bronchitis, chronic ([place])   . Cervical dysplasia   . Coronary artery disease   . Cystocele    either on bladder or kidneys patient is unsure but stated  physician will watch it  . Dizziness    If patient gets out of bed too fast  . Family history of anesthesia complication    Hard for daughter to wake up after Anesthesia  . Gout   . H/O hiatal hernia   . History of Bell's palsy   . Migraine headache   . Osteopenia   . Rosacea conjunctivitis    Right eye is worse than left eye  . Seasonal allergies   . Shortness of breath    with exertion  .  Thyroid disease    Hypo  . Tobacco abuse   . Unspecified vitamin D deficiency    Immunization History  Administered Date(s) Administered  . Influenza, High Dose Seasonal PF 12/20/2013, 12/09/2014, 11/14/2015  . Influenza-Unspecified 01/02/2013  . Pneumococcal Conjugate-13 01/23/2014  . Pneumococcal-Unspecified 03/15/1993, 05/31/2008  . Td 03/16/2007  . Varicella 02/19/2008   Past Surgical History:  Procedure Laterality Date  . ABDOMINAL AORTIC ENDOVASCULAR STENT GRAFT N/A 04/11/2013   Procedure: ABDOMINAL AORTIC ENDOVASCULAR STENT GRAFT WITH RIGHT FEMORAL PATCH ANGIOPLASTY;  Surgeon: Mal Misty, MD;  Location: [facility];  Service: Vascular;  Laterality: N/A;  . BREAST SURGERY     LEFT BREAST BIOPSY  . broken leg Left 1970s  . CARDIAC CATHETERIZATION    . CHOLECYSTECTOMY  1972  . COLONOSCOPY    . EYE SURGERY Bilateral   . Laser vein procedure    . REFRACTIVE SURGERY    . TONSILLECTOMY     FHx:    Reviewed / unchanged  SHx:    Reviewed / unchanged  Systems Review:  Constitutional: Denies fever, chills, wt changes, headaches, insomnia, fatigue, night sweats, change in appetite. Eyes: Denies redness, blurred vision, diplopia, discharge, itchy, watery eyes.  ENT: Denies discharge, congestion, post nasal drip, epistaxis, sore throat, earache, hearing loss, dental pain, tinnitus, vertigo, sinus pain, snoring.  CV: Denies chest pain, palpitations, irregular heartbeat, syncope, dyspnea, diaphoresis, orthopnea, PND, claudication or edema. Respiratory: denies cough, dyspnea, DOE, pleurisy,  hoarseness, laryngitis, wheezing.  Gastrointestinal: Denies dysphagia, odynophagia, heartburn, reflux, water brash, abdominal pain or cramps, nausea, vomiting, bloating, diarrhea, constipation, hematemesis, melena, hematochezia  or hemorrhoids. Genitourinary: Denies dysuria, frequency, urgency, nocturia, hesitancy, discharge, hematuria or flank pain. Musculoskeletal: Denies arthralgias, myalgias, stiffness, jt. swelling, pain, limping or strain/sprain.  Skin: Denies pruritus, rash, hives, warts, acne, eczema or change in skin lesion(s). Neuro: No weakness, tremor, incoordination, spasms, paresthesia or pain. Psychiatric: Denies confusion, memory loss or sensory loss. Endo: Denies change in weight, skin or hair change.  Heme/Lymph: No excessive bleeding, bruising or enlarged lymph nodes.  Physical Exam BP 100/64   Pulse 68   Temp 97.7 F (36.5 C)   Resp 16   Ht 5\' 3"  (1.6 m)   Wt 197 lb 12.8 oz (89.7 kg)   BMI 35.04 kg/m   Appears over nourished and in no distress.  Eyes: PERRLA, EOMs, conjunctiva no swelling or erythema. Sinuses: No frontal/maxillary tenderness ENT/Mouth: EAC's clear, TM's nl w/o erythema, bulging. Nares clear w/o erythema, swelling, exudates. Oropharynx clear without erythema or exudates. Oral hygiene is good. Tongue normal, non obstructing. Hearing intact.  Neck: Supple. Thyroid nl. Car 2+/2+ without bruits, nodes or JVD. Chest: Respirations nl with BS clear & equal w/o rales, rhonchi, wheezing or stridor.  Cor: Heart sounds soft w/ sl irregular rate and rhythm without gr 1-2 sys Ao murmurs and no gallops, clicks, or rubs. Peripheral pulses normal and equal  without edema.  Abdomen: Soft & bowel sounds normal. Non-tender w/o guarding, rebound, hernias, masses, or organomegaly.  Lymphatics: Unremarkable.  Musculoskeletal: Full ROM all peripheral extremities, joint stability, 5/5 strength, and normal gait.  Skin: Warm, dry without exposed rashes, lesions or ecchymosis  apparent.  Neuro: Cranial nerves intact, reflexes equal bilaterally. Sensory-motor testing grossly intact. Tendon reflexes grossly intact.  Pysch: Alert & oriented x 3.  Insight and judgement nl & appropriate. No ideations.  Assessment and Plan:  1. Rapid atrial fibrillation (HCC)  - Digoxin level - Protime-INR  2. Community acquired pneumonia of right  lower lobe of lung ([place])  - complete Levaquin  3. COPD exacerbation ([place])   4. Long term current use of anticoagulant therapy  - Protime-INR  5. HTN  - Continue medication, monitor blood pressure at home. Continue DASH diet. Reminder to go to the ER if any CP, SOB, nausea, dizziness, severe HA, changes vision/speech, left arm numbness and tingling and jaw pain.  6. PreDiabetes  - Continue diet, exercise, lifestyle modifications. Monitor appropriate labs.  7. HLD  - Continue diet/meds, exercise,& lifestyle modifications. Continue monitor periodic cholesterol/liver & renal functions   8. Vit D Deficiency  - Continue supplementation.  5. Medication management  - CBC with Differential/Platelet - BASIC METABOLIC PANEL WITH GFR - Digoxin level - Magnesium     Recommended regular exercise, BP monitoring, weight control, and discussed med and SE's. Recommended labs to assess and monitor clinical status. Further disposition pending results of labs. Over 30 minutes of exam, counseling, chart review was performed

## 2016-01-05 ENCOUNTER — Other Ambulatory Visit: Payer: Self-pay | Admitting: Internal Medicine

## 2016-01-06 ENCOUNTER — Other Ambulatory Visit: Payer: Self-pay | Admitting: Internal Medicine

## 2016-01-06 DIAGNOSIS — B370 Candidal stomatitis: Secondary | ICD-10-CM

## 2016-01-06 DIAGNOSIS — B3781 Candidal esophagitis: Principal | ICD-10-CM

## 2016-01-06 MED ORDER — PREDNISONE 20 MG PO TABS: ORAL_TABLET | ORAL | 0 refills | Status: DC

## 2016-01-06 MED ORDER — NYSTATIN 100000 UNIT/ML MT SUSP: OROMUCOSAL | 0 refills | Status: DC

## 2016-01-07 ENCOUNTER — Encounter: Payer: Self-pay | Admitting: Internal Medicine

## 2016-01-07 ENCOUNTER — Ambulatory Visit (INDEPENDENT_AMBULATORY_CARE_PROVIDER_SITE_OTHER)
Admission: RE | Admit: 2016-01-07 | Discharge: 2016-01-07 | Disposition: A | Discharge status: Home / Self Care | Payer: Medicare Other | Source: Ambulatory Visit | Attending: Internal Medicine | Admitting: Internal Medicine

## 2016-01-07 ENCOUNTER — Ambulatory Visit (INDEPENDENT_AMBULATORY_CARE_PROVIDER_SITE_OTHER): Payer: Medicare Other | Admitting: Internal Medicine

## 2016-01-07 VITALS — BP 108/60 | HR 57 | Ht 63.0 in | Wt 198.0 lb

## 2016-01-07 DIAGNOSIS — R0602 Shortness of breath: Secondary | ICD-10-CM | POA: Diagnosis not present

## 2016-01-07 DIAGNOSIS — J14 Pneumonia due to Hemophilus influenzae: Secondary | ICD-10-CM

## 2016-01-07 DIAGNOSIS — R079 Chest pain, unspecified: Secondary | ICD-10-CM | POA: Diagnosis not present

## 2016-01-07 NOTE — Patient Instructions (Signed)
Please remember to go to the  x-ray department downstairs for your tests - we will call you with the results when they are available.  Please schedule a follow up office visit in 4 weeks, sooner if needed with pfts on return ok to schedule at [facility]

## 2016-01-07 NOTE — Progress Notes (Signed)
Subjective:     Patient ID: Katie [name], female   DOB: 02/20/1941,    MRN: [id number]  HPI   75 yowf quit 2004 with variable sob but on best days can do housework and even some low leverex then admitted  Admit date: 12/23/2015 Discharge date: 12/30/2015  Recommendations for Outpatient Follow-up:  1. Pt will need to follow up with PCP in 1-2 weeks post discharge 2. Please obtain BMP to evaluate electrolytes and kidney function 3. Please also check CBC to evaluate Hg and Hct levels 4. Please note that pt will need repeat chest imaging study to ensure resolution of PNA  5. Pt needs to complete therapy with Levaquin 6. Please also note that Ziac and Benazepril have been removed from pt's medical list as pt said she no longer takes those medications  7. Please note that cardiology started Cardizem, Metolazone has been discontinued     Discharge Diagnoses:  Active Problems:   Atrial fibrillation [facility])  Discharge Condition: Stable  Diet recommendation: Heart healthy diet discussed in details   Brief Narrative: 75 y.o.female,with past medical history of COPD, hypertension, atrial fibrillation on Warfarin,nonischemic cardiomyopathy, nonobstructive coronary artery disease patient presents with complaints of palpitation and shortness of breath this a.m.reports Has been having progressive dyspnea over few month, but then significantly worsened over last week.  Assessment & Plan:  Permanent A. fib (CHADSVASC 75) with rapid ventricular response - currently on cardizem for rate control and it seems to be working well as HR has been stable and at target range  - also on digoxin and coumadin  - appreciate cardiology team assistance, they have signed off as no further recommendations   Acute hypoxic respiratory failure secondary to likely COPD exacerbation and RLL PNA, H. Influenzae - pt placed on Levaquin IV, transitioned to oral Levaquin  - sputum cultures with bacteria H.  Influenzae and FQ should be adequate in coverage  - treat COPD with bronchodilators and steroids - has been successfully tapered off oxygen  - due to hemoptysis Ct chest requested and notable for Interstitial lung disease with bilateral peripheral ground-glass opacities in an upper lobe predominant distribution, with some radiographic improvement from remote prior CT. - also on Ct chest, patchy and ground-glass opacities throughout the right lower lobe, asymmetric involvement of interstitial lung disease versus superimposed acute abnormality such as infection - pt overall better, air movement noted bilaterally but still with coughing spells   Sepsis secondary to RLL PNA, hemophilus influenzae  - pt did meet criteria for sepsis on admission with WBC 15K, RR >20 and HR >90 bpm - will continue Levaquin to complete therapy upon discharge, extended coverage for several more days post discharge as pt still with significant coughing spells   Acute kidney injury - lasix was given IV so suspect this has contributed - encouraged oral intake and Cr is now trending down   Acute on Chronic combined systolic and diastolic congestive heart failure - weight 202 lbs on admission --> 199 --> 198 --> 205 --> 201 lbs this AM - transitioned to oral Lasix 10/12, cardiology has singed off   Obesity  - Body mass index is 35.81 kg/m    01/07/2016 1st [facility] office visit/ Katie [name]   Chief Complaint  Patient presents with  . Pulmonary Consult     Recent hospital admission for PNA and Influenza. She states her breathing is not quite back to baseline and she has minimal cough.   pain R post 75% better /  still some pain on deep insp/ fatigue > sob  No obvious day to day or daytime variability or assoc excess/ purulent sputum or mucus plugs or hemoptysis or   chest tightness, subjective wheeze or overt sinus or hb symptoms. No unusual exp hx or h/o childhood pna/ asthma or knowledge of premature  birth.  Sleeping ok without nocturnal  or early am exacerbation  of respiratory  c/o's or need for noct saba. Also denies any obvious fluctuation of symptoms with weather or environmental changes or other aggravating or alleviating factors except as outlined above   Current Medications, Allergies, Complete Past Medical History, Past Surgical History, Family History, and Social History were reviewed in [employer] record.  ROS  The following are not active complaints unless bolded sore throat, dysphagia, dental problems, itching, sneezing,  nasal congestion or excess/ purulent secretions, ear ache,   fever, chills, sweats, unintended wt loss, classically   exertional cp,  orthopnea pnd or leg swelling, presyncope, palpitations, abdominal pain, anorexia, nausea, vomiting, diarrhea  or change in bowel or bladder habits, change in stools or urine, dysuria,hematuria,  rash, arthralgias, visual complaints, headache, numbness, weakness or ataxia or problems with walking or coordination,  change in mood/affect or memory.             Review of Systems     Objective:   Physical Exam  amb wf nad   Wt Readings from Last 3 Encounters:  01/07/16 198 lb (89.8 kg)  01/02/16 197 lb 12.8 oz (89.7 kg)  12/30/15 201 lb 14.4 oz (91.6 kg)    Vital signs reviewed  - - Note on arrival 02 sats  98% on RA     HEENT: nl dentition, turbinates, and oropharynx. Nl external ear canals without cough reflex   NECK :  without JVD/Nodes/TM/ nl carotid upstrokes bilaterally   LUNGS: no acc muscle use,  Nl contour chest which is clear to A and P bilaterally without cough on insp or exp maneuvers   CV:  RRR  no s3 or murmur or increase in P2, no edema   ABD:  soft and nontender with nl inspiratory excursion in the supine position. No bruits or organomegaly, bowel sounds nl  MS:  Nl gait/ ext warm without deformities, calf tenderness, cyanosis or clubbing No obvious joint restrictions    SKIN: warm and dry without lesions    NEURO:  alert, approp, nl sensorium with  no motor deficits      CXR PA and Lateral:   01/07/2016 :    I personally reviewed images and agree with radiology impression as follows:   Cardiomegaly, chronic appearing coarse Interstitial changes only    Labs reviewed:      Chemistry      Component Value Date/Time   NA 137 01/02/2016 1135   K 5.0 01/02/2016 1135   CL 102 01/02/2016 1135   CO2 25 01/02/2016 1135   BUN 41 (H) 01/02/2016 1135   CREATININE 1.50 (H) 01/02/2016 1135      Component Value Date/Time   CALCIUM 9.1 01/02/2016 1135   ALKPHOS 26 (L) 12/09/2015 1042   AST 16 12/09/2015 1042   ALT 13 12/09/2015 1042   BILITOT 0.5 12/09/2015 1042        Lab Results  Component Value Date   WBC 16.4 (H) 01/02/2016   HGB 16.8 (H) 01/02/2016   HCT 49.4 (H) 01/02/2016   MCV 94.3 01/02/2016   PLT 349 01/02/2016  Lab Results  Component Value Date   TSH 2.022 12/24/2015               Assessment:

## 2016-01-08 ENCOUNTER — Encounter: Payer: Self-pay | Admitting: Internal Medicine

## 2016-01-08 ENCOUNTER — Ambulatory Visit: Payer: Self-pay | Admitting: Internal Medicine

## 2016-01-08 NOTE — Assessment & Plan Note (Signed)
Onset of symptoms ? 12/22/15 with R post cp rx levaquin>> resolved on cxr 01/07/2016   She has mild residual R pleuritic cp with neg v/q and pos sputum for h flu / extensive as dz on CT at the onset so this all fits with a resolving CAP due to hflu s significant pleural effusion so expect this to completely resolve by next f/u ov in 4 weeks otherwise may need repeat CT chest at some point/advised   No further abx needed

## 2016-01-08 NOTE — Assessment & Plan Note (Addendum)
Echo 12/24/15 - Left ventricle: The cavity size was normal. There was mild focal   basal hypertrophy of the septum. Systolic function was normal.   The estimated ejection fraction was in the range of 60% to 65%. - Ventricular septum: Septal motion showed abnormal function,   dyssynergy,   - Aortic valve: Transvalvular velocity was minimally increased.   There was very mild stenosis. There was mild regurgitation. Peak   velocity (S): 218 cm/s. Mean gradient (S): 10 mm Hg. Valve area   (VTI): 1.56 cm^2. Valve area (Vmax): 1.49 cm^2. Valve area   (Vmean): 1.5 cm^2. - Mitral valve: Calcified annulus./ nl LA - Pulmonary arteries: Systolic pressure was moderately increased.   PA peak pressure: 45 mm Hg (S). VQ 12/23/15  Very low prob PE  - old records requested 01/07/2016 >>> - PFT's rec  01/07/2016 >>>  She has improved since acute admit and not clear to what extent this is "COPD flare"  -  When respiratory symptoms begin or become refractory well after a patient reports complete smoking cessation,  Especially when this wasn't the case while they were smoking, a red flag is raised based on the work of Dr Kris Mouton which states:  if you quit smoking when your best day FEV1 is still well preserved it is highly unlikely you will progress to severe disease.  That is to say, once the smoking stops,  the symptoms should not suddenly erupt or markedly worsen.  If so, the differential diagnosis should include  obesity/deconditioning,  LPR/Reflux/Aspiration syndromes,  occult CHF, or  especially side effect of medications commonly used in this population.     Will see how she does back on her nl rx = nothing with prn use of saba/sama pending f/u pfts   May ultimately need [facility] to sort out   Total time devoted to counseling  = 35/9m review case including extensive inpt records  with pt/ discussion of options/alternatives/ personally creating written instructions  in presence of pt  then going over those  specific  Instructions directly with the pt including how to use all of the meds but in particular covering each new medication in detail and the difference between the maintenance/automatic meds and the prns using an action plan format for the latter.

## 2016-01-08 NOTE — Assessment & Plan Note (Signed)
Body mass index is 35.07  Lab Results  Component Value Date   TSH 2.022 12/24/2015     Contributing to gerd tendency/ doe/reviewed the need and the process to achieve and maintain neg calorie balance > defer f/u primary care including intermittently monitoring thyroid status

## 2016-01-08 NOTE — Progress Notes (Signed)
Spoke with pt and notified of results per Dr. Wert. Pt verbalized understanding and denied any questions. 

## 2016-01-10 ENCOUNTER — Encounter: Payer: Self-pay | Admitting: *Deleted

## 2016-01-12 ENCOUNTER — Encounter: Payer: Self-pay | Admitting: Internal Medicine

## 2016-01-12 ENCOUNTER — Ambulatory Visit: Payer: Self-pay

## 2016-01-12 ENCOUNTER — Ambulatory Visit (INDEPENDENT_AMBULATORY_CARE_PROVIDER_SITE_OTHER): Payer: Medicare Other | Admitting: Internal Medicine

## 2016-01-12 VITALS — BP 122/66 | HR 85 | Temp 97.5°F | Resp 16 | Wt 203.0 lb

## 2016-01-12 DIAGNOSIS — Z79899 Other long term (current) drug therapy: Secondary | ICD-10-CM | POA: Diagnosis not present

## 2016-01-12 DIAGNOSIS — Z7901 Long term (current) use of anticoagulants: Secondary | ICD-10-CM | POA: Diagnosis not present

## 2016-01-12 DIAGNOSIS — J189 Pneumonia, unspecified organism: Secondary | ICD-10-CM

## 2016-01-12 NOTE — Progress Notes (Signed)
Assessment and Plan:   1. Long term current use of anticoagulant therapy -cont coumadin -dose adjust as necessary - Protime-INR  2. Medication management -check to see whether BMET and CBC returning to normal -lab reported that they could not get enough blood to test CBC -I reviewed hospital labs and visit last week, likely stress reaction vs. Leukocytosis caused by steroid usage.   - CBC with Differential/Platelet - BASIC METABOLIC PANEL WITH GFR - Hepatic function panel  3. Community acquired pneumonia of right lung, unspecified part of lung -patient has repeat CXR scheduled with Dr. Melvyn Novas -if normal visit in December we can stop visits with pulmonary -finish prednisone -slowly increase activity as tolerated -not using albuterol currently     HPI 75 y.o.female presents for 1 week follow up of flu and also CAP.  She reports that she has been doing a little bit better.  She is currently taking 1 mg of coumadin.  She reports that she is still taking the prednisone as well.  Patient reports that they have been doing well.  female is taking their medication.  They are having difficulty with their medications.  They report no adverse reactions.  She is doing well with the diltiazem.  She reports that she feels like she is really exhausted and is not back to her normal acitvity.    Past Medical History:  Diagnosis Date  . Aortic stenosis    mild AS witm mild to mod AR by 11/2012 echo  . Arthritis   . Atrial fibrillation ([place])   . Atrial flutter ([place])   . Bronchitis, chronic ([place])   . Cervical dysplasia   . Coronary artery disease   . Cystocele    either on bladder or kidneys patient is unsure but stated physician will watch it  . Dizziness    If patient gets out of bed too fast  . Family history of anesthesia complication    Hard for daughter to wake up after Anesthesia  . Gout   . H/O hiatal hernia   . History of Bell's palsy   . Migraine headache   . Osteopenia   .  Rosacea conjunctivitis    Right eye is worse than left eye  . Seasonal allergies   . Shortness of breath    with exertion  . Thyroid disease    Hypo  . Tobacco abuse   . Unspecified vitamin D deficiency      Allergies  Allergen Reactions  . Advair Diskus [Fluticasone-Salmeterol] Other (See Comments)    SKIN CHANGES.  Can tolerate low dose of prednisone without complications  . Amiodarone Other (See Comments)    PULMONARY TOXICITY  . Codeine     unknown  . Diovan [Valsartan] Other (See Comments)    HYPOTENSION  . Doxycycline Diarrhea and Other (See Comments)    VISUAL DISTURBANCE  . Flexeril [Cyclobenzaprine] Other (See Comments)    FATIGUE  . Keflex [Cephalexin] Diarrhea  . Verapamil Other (See Comments)    EDEMA      Current Outpatient Prescriptions on File Prior to Visit  Medication Sig Dispense Refill  . allopurinol (ZYLOPRIM) 300 MG tablet TAKE 1 TABLET BY MOUTH EVERY DAY 90 tablet 1  . aspirin 81 MG tablet Take 81 mg by mouth daily.      Marland Kitchen azelastine (ASTELIN) 0.1 % nasal spray Place 2 sprays into both nostrils 2 (two) times daily. Use in each nostril as directed 30 mL 2  . azithromycin (AZASITE) 1 % ophthalmic  solution Apply 1 drop to eye at bedtime. Apply to both eye lids    . Cetirizine HCl 10 MG CAPS Take 1 capsule (10 mg total) by mouth at bedtime. 90 capsule 2  . digoxin (LANOXIN) 0.25 MG tablet TAKE 1 TABLET BY MOUTH DAILY 90 tablet 3  . diltiazem (CARDIZEM CD) 120 MG 24 hr capsule Take 1 capsule (120 mg total) by mouth daily. 30 capsule 0  . erythromycin ophthalmic ointment Place 1 application into both eyes at bedtime.    . furosemide (LASIX) 40 MG tablet Take 1 tablet (40 mg total) by mouth daily. 30 tablet 0  . gabapentin (NEURONTIN) 300 MG capsule TAKE ONE CAPSULE BY MOUTH 3 TIMES A DAY 270 capsule 1  . guaiFENesin (MUCINEX) 600 MG 12 hr tablet Take 1 tablet (600 mg total) by mouth 2 (two) times daily. 40 tablet 0  . ipratropium (ATROVENT) 0.02 % nebulizer  solution Take 2.5 mLs (0.5 mg total) by nebulization every 6 (six) hours as needed for wheezing or shortness of breath. 500 mL 1  . levalbuterol (XOPENEX) 1.25 MG/0.5ML nebulizer solution Take 1.25 mg by nebulization every 6 (six) hours as needed for wheezing or shortness of breath. 1 each 3  . levothyroxine (SYNTHROID, LEVOTHROID) 50 MCG tablet Take 50 mcg by mouth daily.    Marland Kitchen losartan (COZAAR) 100 MG tablet Take 100 mg by mouth daily.    . meclizine (ANTIVERT) 25 MG tablet Take 1 tablet (25 mg total) by mouth 3 (three) times daily as needed for dizziness. 60 tablet 1  . montelukast (SINGULAIR) 10 MG tablet TAKE 1 TABLET EVERY DAY FOR ALLERGIES 90 tablet 1  . nystatin (MYCOSTATIN) 100000 UNIT/ML suspension Take 1 teaspoon and hold in mouth 2-3 minutes and swallow 4 x/day (and don't eat/drink for 30 minutes) 120 mL 0  . oxybutynin (DITROPAN) 5 MG tablet Take 1 tablet (5 mg total) by mouth 2 (two) times daily. 30 tablet 0  . potassium chloride SA (K-DUR,KLOR-CON) 20 MEQ tablet Take 2 tablets (40 mEq total) by mouth daily. 30 tablet 0  . pravastatin (PRAVACHOL) 40 MG tablet TAKE 1 TABLET BY MOUTH AT BEDTIME FOR CHOLESTEROL 90 tablet 1  . prednisoLONE acetate (PRED FORTE) 1 % ophthalmic suspension 1 drop See admin instructions. 1 drop 4 times a day for 1 week, 3 times a day for 1 week, twice a day for 1 week, once a day for 1 week    . predniSONE (DELTASONE) 20 MG tablet 1 tab 3 x day for 3 days, then 1 tab 2 x day for 3 days, then 1 tab 1 x day for 5 days 20 tablet 0  . prochlorperazine (COMPAZINE) 5 MG tablet TAKE 1 TABLET BY MOUTH 3 TIMES A DAY FOR VERTIGO OR NAUSEA 90 tablet 1  . warfarin (COUMADIN) 2 MG tablet TAKE 1 TO 2 TABLETS BY MOUTH DAILY OR AS DIRECTED 180 tablet 3  . albuterol (PROVENTIL HFA;VENTOLIN HFA) 108 (90 BASE) MCG/ACT inhaler 2 puffs 5 minutes apart every 4 to 6 hours as needed to rescue asthma (Patient taking differently: Inhale 2 puffs into the lungs 5 minutes apart every 4 to 6  hours as needed to rescue asthma) 1 Inhaler 11   No current facility-administered medications on file prior to visit.     ROS: all negative except above.   Physical Exam: Filed Weights   01/12/16 1444  Weight: 203 lb (92.1 kg)   BP 122/66   Pulse 85   Temp 97.5  F (36.4 C)   Resp 16   Wt 203 lb (92.1 kg)   SpO2 98%   BMI 35.96 kg/m  General Appearance: Well developed well nourished, non-toxic appearing in no apparent distress. Eyes: PERRLA, EOMs, conjunctiva w/ no swelling or erythema or discharge Sinuses: No Frontal/maxillary tenderness ENT/Mouth: Ear canals clear without swelling or erythema.  TM's normal bilaterally with no retractions, bulging, or loss of landmarks.   Neck: Supple, thyroid normal, no notable JVD  Respiratory: Respiratory effort normal, Clear breath sounds anteriorly and posteriorly bilaterally without rales, wheezing or stridor. No retractions or accessory muscle usage.  Mild rhonchi in the right middle lung base.   Cardio: RRR with no 3/6 blowing murmur no RGs.   Abdomen: Soft, + BS.  Non tender, no guarding, rebound, hernias, masses.  Musculoskeletal: Full ROM, 5/5 strength, normal gait.  Skin: Warm, dry without rashes  Neuro: Awake and oriented X 3, Cranial nerves intact. Normal muscle tone, no cerebellar symptoms. Sensation intact.  Psych: normal affect, Insight and Judgment appropriate.     Starlyn Skeans, PA-C 3:22 PM [facility]

## 2016-01-13 LAB — HEPATIC FUNCTION PANEL
ALBUMIN: 3.8 g/dL (ref 3.6–5.1)
ALT: 34 U/L — AB (ref 6–29)
AST: 28 U/L (ref 10–35)
Alkaline Phosphatase: 24 U/L — ABNORMAL LOW (ref 33–130)
BILIRUBIN DIRECT: 0.1 mg/dL (ref <=0.2)
BILIRUBIN TOTAL: 0.6 mg/dL (ref 0.2–1.2)
Indirect Bilirubin: 0.5 mg/dL (ref 0.2–1.2)
Total Protein: 6.1 g/dL (ref 6.1–8.1)

## 2016-01-13 LAB — BASIC METABOLIC PANEL WITH GFR
BUN: 33 mg/dL — ABNORMAL HIGH (ref 7–25)
CALCIUM: 9.6 mg/dL (ref 8.6–10.4)
CO2: 22 mmol/L (ref 20–31)
CREATININE: 1.13 mg/dL — AB (ref 0.60–0.93)
Chloride: 100 mmol/L (ref 98–110)
GFR, Est African American: 56 mL/min — ABNORMAL LOW (ref >=60)
GFR, Est Non African American: 48 mL/min — ABNORMAL LOW (ref >=60)
Glucose, Bld: 78 mg/dL (ref 65–99)
Potassium: 5.4 mmol/L — ABNORMAL HIGH (ref 3.5–5.3)
SODIUM: 137 mmol/L (ref 135–146)

## 2016-01-13 LAB — PROTIME-INR
INR: 1.5 — AB
PROTHROMBIN TIME: 16 s — AB (ref 9.0–11.5)

## 2016-01-14 ENCOUNTER — Ambulatory Visit (INDEPENDENT_AMBULATORY_CARE_PROVIDER_SITE_OTHER): Payer: Self-pay | Admitting: Physician Assistant

## 2016-01-15 LAB — CBC WITH DIFFERENTIAL/PLATELET

## 2016-01-18 ENCOUNTER — Encounter: Payer: Self-pay | Admitting: *Deleted

## 2016-01-19 ENCOUNTER — Telehealth: Payer: Self-pay | Admitting: *Deleted

## 2016-01-19 ENCOUNTER — Other Ambulatory Visit: Payer: Self-pay | Admitting: Internal Medicine

## 2016-01-19 NOTE — Telephone Encounter (Signed)
Patient called and states she is having edema in her feet and ankles.  Per Dr Melford Aase, she can increase her Furosemide 40 mg Bid for swelling. The Patient was advised to call back PRN.

## 2016-01-20 ENCOUNTER — Telehealth (INDEPENDENT_AMBULATORY_CARE_PROVIDER_SITE_OTHER): Payer: Self-pay | Admitting: *Deleted

## 2016-01-20 NOTE — Telephone Encounter (Signed)
Pt. Called stating she had declined MRI previously but now would like to go ahead with this. Pt. Call back number is [phone]

## 2016-01-22 NOTE — Telephone Encounter (Signed)
See below, one knee or both

## 2016-01-22 NOTE — Telephone Encounter (Signed)
Pt called back stating this MRI was for her back. Stated they thought she had spinal stenosis but the xray didn't show this and wanted an MRI. Pt requesting call back. Pt is ok to make appt if she needs to before this MRI.  [phone].

## 2016-01-23 ENCOUNTER — Other Ambulatory Visit (INDEPENDENT_AMBULATORY_CARE_PROVIDER_SITE_OTHER): Payer: Self-pay

## 2016-01-23 ENCOUNTER — Encounter: Payer: Self-pay | Admitting: Internal Medicine

## 2016-01-23 ENCOUNTER — Other Ambulatory Visit: Payer: Self-pay | Admitting: Emergency Medicine

## 2016-01-23 ENCOUNTER — Ambulatory Visit (INDEPENDENT_AMBULATORY_CARE_PROVIDER_SITE_OTHER): Payer: Medicare Other | Admitting: Internal Medicine

## 2016-01-23 VITALS — BP 122/72 | HR 98 | Ht 63.5 in | Wt 199.6 lb

## 2016-01-23 DIAGNOSIS — M545 Low back pain: Principal | ICD-10-CM

## 2016-01-23 DIAGNOSIS — I482 Chronic atrial fibrillation, unspecified: Secondary | ICD-10-CM

## 2016-01-23 DIAGNOSIS — G8929 Other chronic pain: Secondary | ICD-10-CM

## 2016-01-23 NOTE — Progress Notes (Signed)
HPI Mrs. Shasteen returns today for followup. She is a very pleasant 75 year old woman with a history of chronic atrial fibrillation, class II heart failure,  Both systolic and diastolic, abdominal aortic aneurysm, and hypertension. In the interim, she has been stable. She admits to dietary indiscretion. She was in the hospital and told that she had pneumonia. No other complaints today except for some arthritis in her back. No chest pain.  Allergies  Allergen Reactions  . Advair Diskus [Fluticasone-Salmeterol] Other (See Comments)    SKIN CHANGES.  Can tolerate low dose of prednisone without complications  . Amiodarone Other (See Comments)    PULMONARY TOXICITY  . Codeine     unknown  . Diovan [Valsartan] Other (See Comments)    HYPOTENSION  . Doxycycline Diarrhea and Other (See Comments)    VISUAL DISTURBANCE  . Flexeril [Cyclobenzaprine] Other (See Comments)    FATIGUE  . Keflex [Cephalexin] Diarrhea  . Verapamil Other (See Comments)    EDEMA     Current Outpatient Prescriptions  Medication Sig Dispense Refill  . albuterol (PROVENTIL HFA;VENTOLIN HFA) 108 (90 BASE) MCG/ACT inhaler 2 puffs 5 minutes apart every 4 to 6 hours as needed to rescue asthma (Patient taking differently: Inhale 2 puffs into the lungs 5 minutes apart every 4 to 6 hours as needed to rescue asthma) 1 Inhaler 11  . allopurinol (ZYLOPRIM) 300 MG tablet TAKE 1 TABLET BY MOUTH EVERY DAY 90 tablet 1  . aspirin 81 MG tablet Take 81 mg by mouth daily.      Marland Kitchen azelastine (ASTELIN) 0.1 % nasal spray Place 2 sprays into both nostrils 2 (two) times daily. Use in each nostril as directed 30 mL 2  . azithromycin (AZASITE) 1 % ophthalmic solution Apply 1 drop to eye at bedtime. Apply to both eye lids    . Cetirizine HCl 10 MG CAPS Take 1 capsule (10 mg total) by mouth at bedtime. 90 capsule 2  . digoxin (LANOXIN) 0.25 MG tablet TAKE 1 TABLET BY MOUTH DAILY 90 tablet 3  . diltiazem (CARDIZEM CD) 120 MG 24 hr capsule Take 1 capsule  (120 mg total) by mouth daily. 30 capsule 0  . erythromycin ophthalmic ointment Place 1 application into both eyes at bedtime.    . furosemide (LASIX) 40 MG tablet Take 1 tablet (40 mg total) by mouth daily. 30 tablet 0  . gabapentin (NEURONTIN) 300 MG capsule TAKE ONE CAPSULE BY MOUTH 3 TIMES A DAY 270 capsule 1  . ipratropium (ATROVENT) 0.02 % nebulizer solution Take 2.5 mLs (0.5 mg total) by nebulization every 6 (six) hours as needed for wheezing or shortness of breath. 500 mL 1  . levalbuterol (XOPENEX) 1.25 MG/0.5ML nebulizer solution Take 1.25 mg by nebulization every 6 (six) hours as needed for wheezing or shortness of breath. 1 each 3  . levothyroxine (SYNTHROID, LEVOTHROID) 50 MCG tablet Take 50 mcg by mouth daily.    Marland Kitchen losartan (COZAAR) 100 MG tablet Take 100 mg by mouth daily.    . meclizine (ANTIVERT) 25 MG tablet Take 1 tablet (25 mg total) by mouth 3 (three) times daily as needed for dizziness. 60 tablet 1  . montelukast (SINGULAIR) 10 MG tablet TAKE 1 TABLET EVERY DAY FOR ALLERGIES 90 tablet 1  . potassium chloride SA (K-DUR,KLOR-CON) 20 MEQ tablet Take 2 tablets (40 mEq total) by mouth daily. 30 tablet 0  . pravastatin (PRAVACHOL) 40 MG tablet TAKE 1 TABLET BY MOUTH AT BEDTIME FOR CHOLESTEROL 90 tablet 1  .  prednisoLONE acetate (PRED FORTE) 1 % ophthalmic suspension 1 drop See admin instructions. 1 drop 4 times a day for 1 week, 3 times a day for 1 week, twice a day for 1 week, once a day for 1 week    . predniSONE (DELTASONE) 20 MG tablet 1 tab 3 x day for 3 days, then 1 tab 2 x day for 3 days, then 1 tab 1 x day for 5 days 20 tablet 0  . prochlorperazine (COMPAZINE) 5 MG tablet TAKE 1 TABLET BY MOUTH 3 TIMES A DAY FOR VERTIGO OR NAUSEA 90 tablet 1  . warfarin (COUMADIN) 2 MG tablet TAKE 1 TO 2 TABLETS BY MOUTH DAILY OR AS DIRECTED 180 tablet 3   No current facility-administered medications for this visit.      Past Medical History:  Diagnosis Date  . Aortic stenosis    mild  AS witm mild to mod AR by 11/2012 echo  . Arthritis   . Atrial fibrillation ([place])   . Atrial flutter ([place])   . Bronchitis, chronic ([place])   . Cervical dysplasia   . Coronary artery disease   . Cystocele    either on bladder or kidneys patient is unsure but stated physician will watch it  . Dizziness    If patient gets out of bed too fast  . Family history of anesthesia complication    Hard for daughter to wake up after Anesthesia  . Gout   . H/O hiatal hernia   . History of Bell's palsy   . Migraine headache   . Osteopenia   . Rosacea conjunctivitis    Right eye is worse than left eye  . Seasonal allergies   . Shortness of breath    with exertion  . Thyroid disease    Hypo  . Tobacco abuse   . Unspecified vitamin D deficiency     ROS:   All systems reviewed and negative except as noted in the HPI.   Past Surgical History:  Procedure Laterality Date  . ABDOMINAL AORTIC ENDOVASCULAR STENT GRAFT N/A 04/11/2013   Procedure: ABDOMINAL AORTIC ENDOVASCULAR STENT GRAFT WITH RIGHT FEMORAL PATCH ANGIOPLASTY;  Surgeon: Mal Misty, MD;  Location: [facility];  Service: Vascular;  Laterality: N/A;  . BREAST SURGERY     LEFT BREAST BIOPSY  . broken leg Left 1970s  . CARDIAC CATHETERIZATION    . CHOLECYSTECTOMY  1972  . COLONOSCOPY    . EYE SURGERY Bilateral   . Laser vein procedure    . REFRACTIVE SURGERY    . TONSILLECTOMY       Family History  Problem Relation Age of Onset  . Cirrhosis Mother   . Cancer Mother 45    PANCREAS  . Heart defect Sister   . Breast cancer Sister     age 49  . Heart disease Sister   . Stroke Sister   . Alcohol abuse Father   . Depression Father   . Hypertension Brother   . Hyperlipidemia Son   . Heart disease Daughter      Social History   Social History  . Marital status: Married    Spouse name: N/A  . Number of children: N/A  . Years of education: N/A   Occupational History  . Not on file.   Social History Main Topics  .  Smoking status: Former Smoker    Types: Cigarettes    Quit date: 08/10/2002  . Smokeless tobacco: Never Used     Comment:  History of tobacco abuse  . Alcohol use 0.0 oz/week     Comment: rare  . Drug use: No  . Sexual activity: Yes    Birth control/ protection: Post-menopausal   Other Topics Concern  . Not on file   Social History Narrative   Lives in [place] with husband   One daughter     BP 122/72   Pulse 98   Ht 5' 3.5" (1.613 m)   Wt 199 lb 9.6 oz (90.5 kg)   BMI 34.80 kg/m   Physical Exam:  Well appearing 75 year old woman, NAD HEENT: Unremarkable Neck:  7 cm JVD, no thyromegally Back:  No CVA tenderness Lungs:  Clear except for scattered rales in the bases. No wheezes or rhonchi. HEART:  IRegular rate rhythm, grade 2/6 systolic murmurs, no rubs, no clicks, her S2 is reduced. Abd:  soft, positive bowel sounds, no organomegally, no rebound, no guarding, a bruit is present Ext:  2 plus pulses, no edema, no cyanosis, no clubbing Skin:  No rashes no nodules Neuro:  CN II through XII intact, motor grossly intact   Assess/Plan: 1. Atrial fib - her ventricular rate is controlled. She will continue her AV nodal blocking drugs. 2. HTN  - her blood pressure has been controlled. Will follow. 3. Chronic systolic and diastolic heart failure - her symptoms are class 2. She admits to dietary indiscretion and I have asked her to reduce her salt intake. 4. Obesity - she has gained 25 lbs in 3 years. I have asked her to lose weight.   Mikle Bosworth.D.

## 2016-01-23 NOTE — Telephone Encounter (Signed)
Pt. Called, I told her that an MRI was going to be scheduled and she verbalized understanding. Pt. Also stated she was at [facility] on 10/7-10/14 and might have has X-rays or tests Dr. Ninfa Linden might want to look at.

## 2016-01-23 NOTE — Telephone Encounter (Signed)
Sent order for L-spine MRI to [facility]

## 2016-01-23 NOTE — Patient Instructions (Signed)
Medication Instructions:  Your physician recommends that you continue on your current medications as directed. Please refer to the Current Medication list given to you today.   Labwork: None Ordered   Testing/Procedures: None Ordered   Follow-Up: Your physician wants you to follow-up in: 1 year with Dr. Taylor. You will receive a reminder letter in the mail two months in advance. If you don't receive a letter, please call our office to schedule the follow-up appointment.   Any Other Special Instructions Will Be Listed Below (If Applicable).     If you need a refill on your cardiac medications before your next appointment, please call your pharmacy.   

## 2016-01-26 ENCOUNTER — Other Ambulatory Visit: Payer: Self-pay | Admitting: Internal Medicine

## 2016-01-26 NOTE — Telephone Encounter (Signed)
See below

## 2016-02-02 ENCOUNTER — Ambulatory Visit: Payer: Self-pay | Admitting: Internal Medicine

## 2016-02-03 ENCOUNTER — Encounter: Payer: Self-pay | Admitting: Internal Medicine

## 2016-02-03 ENCOUNTER — Ambulatory Visit (HOSPITAL_COMMUNITY)
Admission: RE | Admit: 2016-02-03 | Discharge: 2016-02-03 | Disposition: A | Discharge status: Home / Self Care | Payer: Medicare Other | Source: Ambulatory Visit | Attending: Internal Medicine | Admitting: Internal Medicine

## 2016-02-03 ENCOUNTER — Observation Stay (HOSPITAL_COMMUNITY)
Admission: EM | Admit: 2016-02-03 | Discharge: 2016-02-06 | Disposition: A | Discharge status: Home / Self Care | Payer: Medicare Other | Attending: Internal Medicine | Admitting: Internal Medicine

## 2016-02-03 ENCOUNTER — Other Ambulatory Visit: Payer: Self-pay | Admitting: Internal Medicine

## 2016-02-03 ENCOUNTER — Encounter (HOSPITAL_COMMUNITY): Payer: Self-pay | Admitting: Emergency Medicine

## 2016-02-03 ENCOUNTER — Encounter (INDEPENDENT_AMBULATORY_CARE_PROVIDER_SITE_OTHER): Payer: Self-pay

## 2016-02-03 ENCOUNTER — Ambulatory Visit (INDEPENDENT_AMBULATORY_CARE_PROVIDER_SITE_OTHER): Payer: Medicare Other | Admitting: Internal Medicine

## 2016-02-03 VITALS — BP 88/56 | HR 64 | Temp 97.7°F | Resp 16 | Ht 63.0 in | Wt 198.6 lb

## 2016-02-03 DIAGNOSIS — R071 Chest pain on breathing: Secondary | ICD-10-CM | POA: Diagnosis not present

## 2016-02-03 DIAGNOSIS — J329 Chronic sinusitis, unspecified: Secondary | ICD-10-CM | POA: Insufficient documentation

## 2016-02-03 DIAGNOSIS — R0781 Pleurodynia: Secondary | ICD-10-CM | POA: Diagnosis not present

## 2016-02-03 DIAGNOSIS — E039 Hypothyroidism, unspecified: Secondary | ICD-10-CM | POA: Diagnosis present

## 2016-02-03 DIAGNOSIS — Z881 Allergy status to other antibiotic agents status: Secondary | ICD-10-CM | POA: Diagnosis not present

## 2016-02-03 DIAGNOSIS — N3000 Acute cystitis without hematuria: Secondary | ICD-10-CM

## 2016-02-03 DIAGNOSIS — I10 Essential (primary) hypertension: Secondary | ICD-10-CM | POA: Diagnosis not present

## 2016-02-03 DIAGNOSIS — I4891 Unspecified atrial fibrillation: Secondary | ICD-10-CM | POA: Diagnosis present

## 2016-02-03 DIAGNOSIS — I951 Orthostatic hypotension: Secondary | ICD-10-CM | POA: Insufficient documentation

## 2016-02-03 DIAGNOSIS — Z803 Family history of malignant neoplasm of breast: Secondary | ICD-10-CM | POA: Diagnosis not present

## 2016-02-03 DIAGNOSIS — E559 Vitamin D deficiency, unspecified: Secondary | ICD-10-CM | POA: Diagnosis not present

## 2016-02-03 DIAGNOSIS — I350 Nonrheumatic aortic (valve) stenosis: Secondary | ICD-10-CM | POA: Diagnosis not present

## 2016-02-03 DIAGNOSIS — M8580 Other specified disorders of bone density and structure, unspecified site: Secondary | ICD-10-CM | POA: Diagnosis not present

## 2016-02-03 DIAGNOSIS — Z7901 Long term (current) use of anticoagulants: Secondary | ICD-10-CM | POA: Insufficient documentation

## 2016-02-03 DIAGNOSIS — J441 Chronic obstructive pulmonary disease with (acute) exacerbation: Secondary | ICD-10-CM

## 2016-02-03 DIAGNOSIS — R0600 Dyspnea, unspecified: Secondary | ICD-10-CM

## 2016-02-03 DIAGNOSIS — Z800 Family history of malignant neoplasm of digestive organs: Secondary | ICD-10-CM | POA: Insufficient documentation

## 2016-02-03 DIAGNOSIS — N179 Acute kidney failure, unspecified: Secondary | ICD-10-CM | POA: Insufficient documentation

## 2016-02-03 DIAGNOSIS — N183 Chronic kidney disease, stage 3 (moderate): Secondary | ICD-10-CM | POA: Diagnosis not present

## 2016-02-03 DIAGNOSIS — R5383 Other fatigue: Secondary | ICD-10-CM

## 2016-02-03 DIAGNOSIS — Z885 Allergy status to narcotic agent status: Secondary | ICD-10-CM | POA: Insufficient documentation

## 2016-02-03 DIAGNOSIS — R079 Chest pain, unspecified: Secondary | ICD-10-CM

## 2016-02-03 DIAGNOSIS — I482 Chronic atrial fibrillation, unspecified: Secondary | ICD-10-CM

## 2016-02-03 DIAGNOSIS — E785 Hyperlipidemia, unspecified: Secondary | ICD-10-CM | POA: Diagnosis not present

## 2016-02-03 DIAGNOSIS — I2510 Atherosclerotic heart disease of native coronary artery without angina pectoris: Secondary | ICD-10-CM | POA: Diagnosis not present

## 2016-02-03 DIAGNOSIS — R42 Dizziness and giddiness: Secondary | ICD-10-CM | POA: Diagnosis not present

## 2016-02-03 DIAGNOSIS — Z8249 Family history of ischemic heart disease and other diseases of the circulatory system: Secondary | ICD-10-CM | POA: Diagnosis not present

## 2016-02-03 DIAGNOSIS — Z888 Allergy status to other drugs, medicaments and biological substances status: Secondary | ICD-10-CM | POA: Insufficient documentation

## 2016-02-03 DIAGNOSIS — I5042 Chronic combined systolic (congestive) and diastolic (congestive) heart failure: Secondary | ICD-10-CM | POA: Diagnosis not present

## 2016-02-03 DIAGNOSIS — R5381 Other malaise: Secondary | ICD-10-CM

## 2016-02-03 DIAGNOSIS — I130 Hypertensive heart and chronic kidney disease with heart failure and stage 1 through stage 4 chronic kidney disease, or unspecified chronic kidney disease: Secondary | ICD-10-CM | POA: Diagnosis not present

## 2016-02-03 DIAGNOSIS — R001 Bradycardia, unspecified: Secondary | ICD-10-CM | POA: Diagnosis not present

## 2016-02-03 DIAGNOSIS — J45909 Unspecified asthma, uncomplicated: Secondary | ICD-10-CM | POA: Diagnosis not present

## 2016-02-03 DIAGNOSIS — R0602 Shortness of breath: Secondary | ICD-10-CM | POA: Diagnosis not present

## 2016-02-03 DIAGNOSIS — Z7982 Long term (current) use of aspirin: Secondary | ICD-10-CM | POA: Insufficient documentation

## 2016-02-03 DIAGNOSIS — M109 Gout, unspecified: Secondary | ICD-10-CM | POA: Insufficient documentation

## 2016-02-03 LAB — COMPREHENSIVE METABOLIC PANEL
ALBUMIN: 3.8 g/dL (ref 3.5–5.0)
ALK PHOS: 30 U/L — AB (ref 38–126)
ALT: 18 U/L (ref 14–54)
AST: 29 U/L (ref 15–41)
Anion gap: 16 — ABNORMAL HIGH (ref 5–15)
BILIRUBIN TOTAL: 0.8 mg/dL (ref 0.3–1.2)
BUN: 25 mg/dL — AB (ref 6–20)
CALCIUM: 10 mg/dL (ref 8.9–10.3)
CO2: 24 mmol/L (ref 22–32)
CREATININE: 1.94 mg/dL — AB (ref 0.44–1.00)
Chloride: 92 mmol/L — ABNORMAL LOW (ref 101–111)
GFR calc Af Amer: 28 mL/min — ABNORMAL LOW (ref >60)
GFR calc non Af Amer: 24 mL/min — ABNORMAL LOW (ref >60)
GLUCOSE: 96 mg/dL (ref 65–99)
Potassium: 4.7 mmol/L (ref 3.5–5.1)
Sodium: 132 mmol/L — ABNORMAL LOW (ref 135–145)
TOTAL PROTEIN: 6.1 g/dL — AB (ref 6.5–8.1)

## 2016-02-03 LAB — CBC WITH DIFFERENTIAL/PLATELET
BASOS ABS: 0.1 10*3/uL (ref 0.0–0.1)
BASOS PCT: 1 %
Eosinophils Absolute: 0.2 10*3/uL (ref 0.0–0.7)
Eosinophils Relative: 3 %
HEMATOCRIT: 45.1 % (ref 36.0–46.0)
HEMOGLOBIN: 15.2 g/dL — AB (ref 12.0–15.0)
LYMPHS PCT: 32 %
Lymphs Abs: 3 10*3/uL (ref 0.7–4.0)
MCH: 31.6 pg (ref 26.0–34.0)
MCHC: 33.7 g/dL (ref 30.0–36.0)
MCV: 93.8 fL (ref 78.0–100.0)
Monocytes Absolute: 0.9 10*3/uL (ref 0.1–1.0)
Monocytes Relative: 10 %
NEUTROS ABS: 5 10*3/uL (ref 1.7–7.7)
NEUTROS PCT: 54 %
Platelets: 287 10*3/uL (ref 150–400)
RBC: 4.81 MIL/uL (ref 3.87–5.11)
RDW: 14.6 % (ref 11.5–15.5)
WBC: 9.2 10*3/uL (ref 4.0–10.5)

## 2016-02-03 LAB — URINE MICROSCOPIC-ADD ON

## 2016-02-03 LAB — I-STAT TROPONIN, ED: Troponin i, poc: 0.02 ng/mL (ref 0.00–0.08)

## 2016-02-03 LAB — URINALYSIS, ROUTINE W REFLEX MICROSCOPIC
BILIRUBIN URINE: NEGATIVE
Glucose, UA: NEGATIVE mg/dL
HGB URINE DIPSTICK: NEGATIVE
Ketones, ur: NEGATIVE mg/dL
NITRITE: NEGATIVE
PH: 6 (ref 5.0–8.0)
Protein, ur: NEGATIVE mg/dL
SPECIFIC GRAVITY, URINE: 1.012 (ref 1.005–1.030)

## 2016-02-03 LAB — DIGOXIN LEVEL: Digoxin Level: 1.4 ng/mL (ref 0.8–2.0)

## 2016-02-03 MED ORDER — SODIUM CHLORIDE 0.9 % IV SOLN
INTRAVENOUS | Status: DC
  Administered 2016-02-04: 01:00:00 via INTRAVENOUS

## 2016-02-03 NOTE — ED Provider Notes (Signed)
[facility] DEPT Provider Note   CSN: [id number] Arrival date & time: 02/03/16  1752     History   Chief Complaint Chief Complaint  Patient presents with  . Hypotension    HPI Katie [name] is a 75 y.o. female.  HPI The patient reports that she was hospitalized 2 weeks ago for pneumonia. She states that she didn't feel very well when she left the hospital. She reports she has continued to feel weak and fatigued. She has had some ongoing cough but it is improved relative to previously. She denies having fever at any point time. She reports her legs were pretty swollen so she took metazolone  dose and took that for 2 days. She reports that they are improved. She states the left lower leg always swells more due to an old accident. The patient reports that she always intermittently has some chest pressure. That comes and goes. Patient saw her primary care doctor today because of her persisting symptoms. While in the office, orthostatic blood pressures were done and the patient was found to be very orthostatic with a drop in her systolic blood pressure to 64. The patient poor she's chronically had atrial fibrillation for 14 years. She has not noted any change. She was referred to the emergency department for further diagnostic evaluation. Past Medical History:  Diagnosis Date  . Aortic stenosis    mild AS witm mild to mod AR by 11/2012 echo  . Arthritis   . Atrial fibrillation ([place])   . Atrial flutter ([place])   . Bronchitis, chronic ([place])   . Cervical dysplasia   . Coronary artery disease   . Cystocele    either on bladder or kidneys patient is unsure but stated physician will watch it  . Dizziness    If patient gets out of bed too fast  . Family history of anesthesia complication    Hard for daughter to wake up after Anesthesia  . Gout   . H/O hiatal hernia   . History of Bell's palsy   . Migraine headache   . Osteopenia   . Rosacea conjunctivitis    Right eye is worse than  left eye  . Seasonal allergies   . Shortness of breath    with exertion  . Thyroid disease    Hypo  . Tobacco abuse   . Unspecified vitamin D deficiency     Patient Active Problem List   Diagnosis Date Noted  . SOB (shortness of breath) 01/08/2016  . CAP (community acquired pneumonia) due to Haemophilus influenzae ([place]) 01/07/2016  . Acute diastolic congestive heart failure ([place]) 12/25/2015  . Sepsis due to pneumonia ([place])   . Atrial fibrillation with rapid ventricular response ([place]) 12/23/2015  . Other abnormal glucose 09/19/2015  . AAA (abdominal aortic aneurysm) without rupture ([place]) 04/22/2015  . Medicare annual wellness visit, initial 11/13/2014  . Morbid obesity due to excess calories ([place]) 08/03/2014  . PAD (peripheral artery disease) ([place]) 04/16/2014  . PVD (peripheral vascular disease) with claudication ([place]) 10/16/2013  . CKD  stage III (GFR 51 ml/min) 06/08/2013  . Hyperlipidemia 06/08/2013  . Medication management 06/08/2013  . 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 Long term current use of anticoagulant therapy 04/23/2013  . AAA (abdominal aortic aneurysm) ([place]) 04/11/2013  . Vitamin D deficiency 02/15/2013  . Cervical dysplasia   . Hypothyroidism   . Osteopenia   . Congestive heart failure ([place]) 11/25/2008  . Migraine headache 11/22/2008  . Essential hypertension 11/22/2008  .  Coronary atherosclerosis 11/22/2008  . Atrial fibrillation ([place]) 11/22/2008  . COPD (chronic obstructive pulmonary disease) with chronic bronchitis ([place]) 11/22/2008  . GERD 11/22/2008    Past Surgical History:  Procedure Laterality Date  . ABDOMINAL AORTIC ENDOVASCULAR STENT GRAFT N/A 04/11/2013   Procedure: ABDOMINAL AORTIC ENDOVASCULAR STENT GRAFT WITH RIGHT FEMORAL PATCH ANGIOPLASTY;  Surgeon: Mal Misty, MD;  Location: [facility];  Service: Vascular;  Laterality: N/A;  . BREAST SURGERY     LEFT BREAST BIOPSY  . broken leg Left 1970s  . CARDIAC CATHETERIZATION      . CHOLECYSTECTOMY  1972  . COLONOSCOPY    . EYE SURGERY Bilateral   . Laser vein procedure    . REFRACTIVE SURGERY    . TONSILLECTOMY      OB History    Gravida Para Term Preterm AB Living   4 3 3   1 3    SAB TAB Ectopic Multiple Live Births                   Home Medications    Prior to Admission medications   Medication Sig Start Date End Date Taking? Authorizing Provider  acetaminophen (TYLENOL) 500 MG tablet Take 1,000 mg by mouth 2 (two) times daily as needed for moderate pain.   Yes Historical Provider, MD  albuterol (PROVENTIL HFA;VENTOLIN HFA) 108 (90 BASE) MCG/ACT inhaler 2 puffs 5 minutes apart every 4 to 6 hours as needed to rescue asthma Patient taking differently: Inhale 2 puffs into the lungs every 4 (four) hours as needed for shortness of breath. Inhale 2 puffs into the lungs 5 minutes apart every 4 to 6 hours as needed to rescue asthma 08/13/13 02/03/16 Yes Melissa Smith, PA-C  allopurinol (ZYLOPRIM) 300 MG tablet TAKE 1 TABLET BY MOUTH EVERY DAY 11/27/15  Yes Vicie Mutters, PA-C  aspirin 81 MG tablet Take 81 mg by mouth daily.     Yes Historical Provider, MD  azelastine (ASTELIN) 0.1 % nasal spray Place 2 sprays into both nostrils 2 (two) times daily. Use in each nostril as directed Patient taking differently: Place 2 sprays into both nostrils at bedtime. Use in each nostril as directed 09/04/15  Yes Vicie Mutters, PA-C  digoxin (LANOXIN) 0.25 MG tablet TAKE 1 TABLET BY MOUTH DAILY 01/23/16  Yes [name], MD  diltiazem (CARDIZEM CD) 120 MG 24 hr capsule TAKE 1 CAPSULE (120 MG TOTAL) BY MOUTH DAILY. 01/26/16  Yes [name], MD  erythromycin ophthalmic ointment Place 1 application into both eyes at bedtime.   Yes Historical Provider, MD  furosemide (LASIX) 40 MG tablet Take 1 tablet (40 mg total) by mouth daily. 12/30/15  Yes Theodis Blaze, MD  gabapentin (NEURONTIN) 300 MG capsule TAKE ONE CAPSULE BY MOUTH 3 TIMES A DAY 11/14/15  Yes Vicie Mutters, PA-C   hydroxypropyl methylcellulose / hypromellose (ISOPTO TEARS / GONIOVISC) 2.5 % ophthalmic solution Place 1 drop into both eyes 3 (three) times daily.   Yes Historical Provider, MD  ibuprofen (ADVIL,MOTRIN) 200 MG tablet Take 400 mg by mouth every 8 (eight) hours as needed for moderate pain.   Yes Historical Provider, MD  ipratropium (ATROVENT) 0.02 % nebulizer solution Take 2.5 mLs (0.5 mg total) by nebulization every 6 (six) hours as needed for wheezing or shortness of breath. 12/30/15  Yes Theodis Blaze, MD  levalbuterol (XOPENEX) 1.25 MG/0.5ML nebulizer solution Take 1.25 mg by nebulization every 6 (six) hours as needed for wheezing or shortness of breath. 12/30/15  Yes  Theodis Blaze, MD  levothyroxine (SYNTHROID, LEVOTHROID) 50 MCG tablet Take 50 mcg by mouth daily. 11/29/15  Yes Historical Provider, MD  losartan (COZAAR) 100 MG tablet Take 100 mg by mouth daily.   Yes Historical Provider, MD  meclizine (ANTIVERT) 25 MG tablet Take 1 tablet (25 mg total) by mouth 3 (three) times daily as needed for dizziness. 04/18/15  Yes Courtney Forcucci, PA-C  metolazone (ZAROXOLYN) 2.5 MG tablet Take 2.5 mg by mouth daily as needed (for excess fluid).   Yes Historical Provider, MD  montelukast (SINGULAIR) 10 MG tablet TAKE 1 TABLET EVERY DAY FOR ALLERGIES 08/19/15  Yes [name], MD  potassium chloride SA (K-DUR,KLOR-CON) 20 MEQ tablet Take 2 tablets (40 mEq total) by mouth daily. Patient taking differently: Take 20 mEq by mouth daily as needed (takes with metolazone only).  05/06/15  Yes Courtney Forcucci, PA-C  pravastatin (PRAVACHOL) 40 MG tablet TAKE 1 TABLET BY MOUTH AT BEDTIME FOR CHOLESTEROL 01/05/16  Yes [name], MD  prochlorperazine (COMPAZINE) 5 MG tablet TAKE 1 TABLET BY MOUTH 3 TIMES A DAY FOR VERTIGO OR NAUSEA 09/16/15  Yes [name], MD  warfarin (COUMADIN) 2 MG tablet TAKE 1 TO 2 TABLETS BY MOUTH DAILY OR AS DIRECTED Patient taking differently: takes 2mg  on tues, thurs and sat,  takes 4mg  all other days 03/05/15  Yes [name], MD    Family History Family History  Problem Relation Age of Onset  . Cirrhosis Mother   . Cancer Mother 30    PANCREAS  . Heart defect Sister   . Breast cancer Sister     age 13  . Heart disease Sister   . Stroke Sister   . Alcohol abuse Father   . Depression Father   . Hypertension Brother   . Hyperlipidemia Son   . Heart disease Daughter     Social History Social History  Substance Use Topics  . Smoking status: Former Smoker    Types: Cigarettes    Quit date: 08/10/2002  . Smokeless tobacco: Never Used     Comment: History of tobacco abuse  . Alcohol use 0.0 oz/week     Comment: rare     Allergies   Amiodarone; Diovan [valsartan]; Doxycycline; Flexeril [cyclobenzaprine]; Keflex [cephalexin]; Verapamil; Advair diskus [fluticasone-salmeterol]; and Codeine   Review of Systems Review of Systems 10 Systems reviewed and are negative for acute change except as noted in the HPI.  Physical Exam Updated Vital Signs BP 108/69   Pulse 88   Temp 97.4 F (36.3 C) (Axillary)   Resp 17   Ht 5\' 3"  (1.6 m)   Wt 198 lb (89.8 kg)   SpO2 98%   BMI 35.07 kg/m   Physical Exam  Constitutional: She is oriented to person, place, and time.  Awake, alert, nontoxic appearance with baseline speech for patient.  HENT:  Head: Atraumatic.  Mouth/Throat: Oropharynx is clear and moist. No oropharyngeal exudate.  Eyes: EOM are normal. Pupils are equal, round, and reactive to light. Right eye exhibits no discharge. Left eye exhibits no discharge.  Neck: Neck supple.  Cardiovascular:  No murmur heard. Irregularly irregular. Borderline tachycardia.  Pulmonary/Chest: Effort normal and breath sounds normal. No stridor. No respiratory distress. She has no wheezes. She has no rales. She exhibits no tenderness.  Abdominal: Soft. Bowel sounds are normal. She exhibits no mass. There is no tenderness. There is no rebound.   Musculoskeletal: She exhibits no edema or tenderness.  Baseline ROM, moves extremities with no obvious  new focal weakness.  Lymphadenopathy:    She has no cervical adenopathy.  Neurological: She is alert and oriented to person, place, and time. She exhibits normal muscle tone. Coordination normal.  Awake, alert, cooperative and aware of situation; motor strength bilaterally; sensation normal to light touch bilaterally; peripheral visual fields full to confrontation; no facial asymmetry; tongue midline; major cranial nerves appear intact; no pronator drift, normal finger to nose bilaterally, baseline gait without new ataxia.  Skin: Skin is warm and dry. No rash noted.  Psychiatric: She has a normal mood and affect.  Nursing note and vitals reviewed.    ED Treatments / Results  Labs (all labs ordered are listed, but only abnormal results are displayed) Labs Reviewed  CBC WITH DIFFERENTIAL/PLATELET - Abnormal; Notable for the following:       Result Value   Hemoglobin 15.2 (*)    All other components within normal limits  COMPREHENSIVE METABOLIC PANEL - Abnormal; Notable for the following:    Sodium 132 (*)    Chloride 92 (*)    BUN 25 (*)    Creatinine, Ser 1.94 (*)    Total Protein 6.1 (*)    Alkaline Phosphatase 30 (*)    GFR calc non Af Amer 24 (*)    GFR calc Af Amer 28 (*)    Anion gap 16 (*)    All other components within normal limits  URINALYSIS, ROUTINE W REFLEX MICROSCOPIC (NOT AT [facility]) - Abnormal; Notable for the following:    Color, Urine AMBER (*)    APPearance CLOUDY (*)    Leukocytes, UA MODERATE (*)    All other components within normal limits  URINE MICROSCOPIC-ADD ON - Abnormal; Notable for the following:    Squamous Epithelial / LPF 6-30 (*)    Bacteria, UA MANY (*)    Casts HYALINE CASTS (*)    All other components within normal limits  URINE CULTURE  DIGOXIN LEVEL  I-STAT TROPOININ, ED    EKG  EKG Interpretation None       Radiology Dg Chest  2 View  Result Date: 02/03/2016 CLINICAL DATA:  Pneumonia.  Shortness of breath and dyspnea EXAM: CHEST  2 VIEW COMPARISON:  01/07/2016 FINDINGS: Mild cardiac enlargement. No pleural effusion or edema. The lungs are hyperinflated. There are coarsened interstitial markings noted bilaterally. Interval clearing of bilateral lower lobe opacities. No new findings. IMPRESSION: 1. Interval clearing of bilateral lower lobe pneumonias. Electronically Signed   By: Kerby Moors M.D.   On: 02/03/2016 15:52    Procedures Procedures (including critical care time)  Medications Ordered in ED Medications  0.9 %  sodium chloride infusion (not administered)     Initial Impression / Assessment and Plan / ED Course  I have reviewed the triage vital signs and the nursing notes.  Pertinent labs & imaging results that were available during my care of the patient were reviewed by me and considered in my medical decision making (see chart for details).  Clinical Course    Consult: Hospitalist for admission. Final Clinical Impressions(s) / ED Diagnoses   Final diagnoses:  Orthostatic hypotension  Malaise and fatigue  Acute cystitis without hematuria   Patient continues to have symptoms of malaise and fatigue. She does feel that her cough and dyspnea have improved since treatment with antibiotics during hospitalization. Patient was falling off her PCP today and found orthostatic hypotension. Patient's husband is very concerned that there is ongoing etiology of  untreated illness causing symptoms. Patient will be  brought in the hospital for ongoing monitoring and further diagnostic evaluation as indicated. New Prescriptions New Prescriptions   No medications on file     Charlesetta Shanks, MD 02/03/16 2346

## 2016-02-03 NOTE — Progress Notes (Signed)
[facility] [name], M.D.        Uvaldo Bristle. Silverio Lay, P.A.-C       Starlyn Skeans, P.A.-C  [facility]                [address] [place], [place] [address] Telephone [phone] Telefax [phone] ______________________________________________________________________     This very nice 75 y.o.  MWF presents with c/o weakness and dyspnea. Patient has been followed with  HTN, ASHD/cAfib, ch CHF and COPD and was just hospitalized Oct 10-17 with AECB and CAP. She presents acutely with profound postural orthostasisand is referred to the ER for evaluation and probable admission. She is c/o shortness of breath and Right posterolateral sharp chest wall pains exacerbated with activity and positioning but not exertion. Denies fever, chills, sweats, rash, sputum or hemoptysis.      Patient is treated for HTN (1999)  & BP has been controlled at home. Today's BP: (!) 88/56 (left 88/56/R 92/58). Patient has had no complaints of any cardiac type chest pain, palpitations, /orthopnea/PND, dizziness, claudication, but she does have dependent edema and occasionally take an extra Lasix 2-3 x/week for worsening dependent edema. .     Hyperlipidemia is controlled with diet & meds. Patient denies myalgias or other med SE's. Last Lipids were at goal albeit elevated Trig's: Lab Results  Component Value Date   CHOL 137 12/09/2015   HDL 24 (L) 12/09/2015   LDLCALC 61 12/09/2015   TRIG 258 (H) 12/09/2015   CHOLHDL 5.7 (H) 12/09/2015      Also, the patient has history of Morbid Obesity (BMI 35+) and consequent PreDiabetes and has had no symptoms of reactive hypoglycemia, diabetic polys, paresthesias or visual blurring.  Last A1c was at goal: Lab Results  Component Value Date   HGBA1C 5.6 12/09/2015      Further, the patient also has history of Vitamin D Deficiency and supplements vitamin D without any suspected  side-effects. Last vitamin D was at goal:  Lab Results  Component Value Date   VD25OH 73 12/09/2015   Current Outpatient Prescriptions on File Prior to Visit  Medication Sig  . albuterol  HFA inhaler 2 puffs 5 min apart every 4-6 hrs as needed to rescue asthma   . allopurinol  300 MG tablet TAKE 1 TAB EVERY DAY  . aspirin 81 MG tablet Take daily.    . ASTELIN 0.1 % nas  spray 2 sprays into both nostrils 2x daily.   . Cetirizine HCl 10 MG CAPS Take 1 cap at bedtime.  . digoxin 0.25 MG tablet -  takes 1/2 tab   . diltiazem  CD 120 MG 24 hr capsule TAKE 1 CAP DAILY.  . furosemide  40 MG tablet Take 1 tab daily.  Marland Kitchen gabapentin  300 MG capsule TAKE ONE CAP 3 TIMES A DAY  . ATROVENT 0.02 % neb soln Take 2.5 mLs by neb every 6 hrs as needed   . lXOPENEX  neb soln Take 1.25 mg by neb every 6  hrs as needed   . levothyroxine 50 MCG  Take  daily.  Marland Kitchen losartan  100 MG tab Take daily.  . Montelukast 10 MG tablet TAKE 1 TAB EVERY DAY FOR ALLERGIES  . K-DUR 20 MEQ  Take 2 tablets (40 mEq total) by mouth daily.  . pravastatin  40 MG tablet TAKE 1 TAB AT  BEDTIME   . prochlorperazine 5 MG tablet TAKE 1 TAB 3 x/ DAY   . warfarin  2 MG tablet TAKE 1 TO 2 TAB DAILY    Allergies  Allergen Reactions  . Advair Diskus [Fluticasone-Salmeterol] Other (See Comments)    SKIN CHANGES.  Can tolerate low dose of prednisone without complications  . Amiodarone Other (See Comments)    PULMONARY TOXICITY  . Codeine     unknown  . Diovan [Valsartan] Other (See Comments)    HYPOTENSION  . Doxycycline Diarrhea and Other (See Comments)    VISUAL DISTURBANCE  . Flexeril [Cyclobenzaprine] Other (See Comments)    FATIGUE  . Keflex [Cephalexin] Diarrhea  . Verapamil Other (See Comments)    EDEMA   PMHx:   Past Medical History:  Diagnosis Date  . Aortic stenosis    mild AS witm mild to mod AR by 11/2012 echo  . Arthritis   . Atrial fibrillation ([place])   . Atrial flutter ([place])   . Bronchitis, chronic ([place])   .  Cervical dysplasia   . Coronary artery disease   . Cystocele    either on bladder or kidneys patient is unsure but stated physician will watch it  . Dizziness    If patient gets out of bed too fast  . Family history of anesthesia complication    Hard for daughter to wake up after Anesthesia  . Gout   . H/O hiatal hernia   . History of Bell's palsy   . Migraine headache   . Osteopenia   . Rosacea conjunctivitis    Right eye is worse than left eye  . Seasonal allergies   . Shortness of breath    with exertion  . Thyroid disease    Hypo  . Tobacco abuse   . Unspecified vitamin D deficiency    Immunization History  Administered Date(s) Administered  . Influenza, High Dose Seasonal PF 12/20/2013, 12/09/2014, 11/14/2015  . Influenza-Unspecified 01/02/2013  . Pneumococcal Conjugate-13 01/23/2014  . Pneumococcal-Unspecified 03/15/1993, 05/31/2008  . Td 03/16/2007  . Varicella 02/19/2008   Past Surgical History:  Procedure Laterality Date  . ABDOMINAL AORTIC ENDOVASCULAR STENT GRAFT N/A 04/11/2013   Procedure: ABDOMINAL AORTIC ENDOVASCULAR STENT GRAFT WITH RIGHT FEMORAL PATCH ANGIOPLASTY;  Surgeon: Mal Misty, MD;  Location: [facility];  Service: Vascular;  Laterality: N/A;  . BREAST SURGERY     LEFT BREAST BIOPSY  . broken leg Left 1970s  . CARDIAC CATHETERIZATION    . CHOLECYSTECTOMY  1972  . COLONOSCOPY    . EYE SURGERY Bilateral   . Laser vein procedure    . REFRACTIVE SURGERY    . TONSILLECTOMY     FHx:    Reviewed / unchanged  SHx:    Reviewed / unchanged  Systems Review:  Constitutional: Denies fever, chills, wt changes, headaches, insomnia, fatigue, night sweats, change in appetite. Eyes: Denies redness, blurred vision, diplopia, discharge, itchy, watery eyes.  ENT: Denies discharge, congestion, post nasal drip, epistaxis, sore throat, earache, hearing loss, dental pain, tinnitus, vertigo, sinus pain, snoring.  CV: Denies palpitations, irregular heartbeat, syncope,   diaphoresis, orthopnea, PND, claudication or edema. Respiratory: denies cough, pleurisy, hoarseness, laryngitis, wheezing.  Gastrointestinal: Denies dysphagia, odynophagia, heartburn, reflux, water brash, abdominal pain or cramps, nausea, vomiting, bloating, diarrhea, constipation, hematemesis, melena, hematochezia  or hemorrhoids. Genitourinary: Denies dysuria, frequency, urgency, nocturia, hesitancy, discharge, hematuria or flank pain. Musculoskeletal: Denies arthralgias, myalgias, stiffness, jt. swelling, pain, limping or strain/sprain.  Skin: Denies pruritus, rash, hives,  warts, acne, eczema or change in skin lesion(s). Neuro: No weakness, tremor, incoordination, spasms, paresthesia or pain. Psychiatric: Denies confusion, memory loss or sensory loss. Endo: Denies change in weight, skin or hair change.  Heme/Lymph: No excessive bleeding, bruising or enlarged lymph nodes.  Physical Exam BP 88/56 Comment: left 88/56/R 92/58  P 64   T 97.7 F    R 16   Ht 5\' 3"    Wt 198 lb 9.6 oz BMI 35.18  Postural BP's with sitting BP 94/53, P 83 and standing BP 64/48 , P 75.   Appears over nourished and in no distress.  Eyes: PERRLA, EOMs, conjunctiva no swelling or erythema. Sinuses: No frontal/maxillary tenderness ENT/Mouth: EAC's clear, TM's nl w/o erythema, bulging. Nares clear w/o erythema, swelling, exudates. Oropharynx clear without erythema or exudates. Oral hygiene is good. Tongue normal, non obstructing. Hearing intact.  Neck: Supple. Thyroid nl. Car 2+/2+ without bruits, nodes or JVD. Chest: Respirations nl with BS decreased w/o rales, rhonchi, wheezing or stridor. There is palpable tenderness of the posterior lateral chest wall.  Cor: Heart sounds soft w/ irregular rate and rhythm without sig. murmurs. Peripheral pulses normal obscured by 1-2 + pedal edema.  Abdomen: Soft & bowel sounds normal. Non-tender w/o guarding, rebound, hernias, masses, or organomegaly.  Lymphatics:  Unremarkable.  Musculoskeletal: Full ROM all peripheral extremities, joint stability, 5/5 strength and normal gait.  Skin: Warm, dry without exposed rashes, lesions or ecchymosis apparent.  Neuro: Cranial nerves intact, reflexes equal bilaterally. Sensory-motor testing grossly intact. Tendon reflexes grossly intact.  Pysch: Alert & oriented x 3.  Insight and judgement nl & appropriate. No ideations.  Assessment and Plan:  1. Orthostatic hypotension  - Patient was referred to the ER for w/u and probable admission.  2. Chronic atrial fibrillation ([place])   3. Essential hypertension   4. COPD  ([place])   Over 30 minutes of exam, counseling, chart review was performed

## 2016-02-03 NOTE — ED Notes (Signed)
Pt. vital signs checked , nurse explained process/delay to pt. And family. No distress at this time /respiratioins unlabored/ no pain .

## 2016-02-03 NOTE — ED Triage Notes (Signed)
Pt seen at PCP today for chest pressure that's on and off x 2 weeks. Pt complains of weakness. PCP sent pt to ED for low blood pressure. BP currently 123/78.

## 2016-02-03 NOTE — ED Notes (Signed)
Pts family member concerned at [facility] about pts wait time since her doctor sent her over to be seen, this tech check vitals and all are WDL. Pt updated about wait time.

## 2016-02-03 NOTE — Patient Instructions (Signed)
Husband to transport to [facility] ER

## 2016-02-03 NOTE — H&P (Signed)
History and Physical    Katie [name] ZYS:[id number] DOB: [birth year] DOA: 02/03/2016  Referring MD/NP/PA: er PCP: [name], MD Outpatient Specialists:  Patient coming from: home  Chief Complaint: sent from PCP for + orthostatics  HPI: Katie [name] is a 75 y.o. female with medical history significant of a fib, recent H. Flu B/L PNA.  She was recently hospitalized for PNA for 7 days in October.  Since then she has felt weak/fatigued more than normal.  More recently her legs began to swell and she took 2 dosed of metalzone.  Her swelling improved but when she went to her PCP, she was orthostatic.  She does not report dizziness but does feel unsteady on her feet.  She has fallen a few times as well.   She also has pain with deep inspiration on both sides of her chest wall R>L. + frequency but no dysuria No SOB -has some dietary indiscretions and drinks 4- 16oz bottles of water/day  ED Course: in the ER, her orthostasis improved with IVF, labs were remarkable for increased CR  Review of Systems: all systems reviewed, negative unless stated above in HPI   Past Medical History:  Diagnosis Date  . Aortic stenosis    mild AS witm mild to mod AR by 11/2012 echo  . Arthritis   . Atrial fibrillation ([place])   . Atrial flutter ([place])   . Bronchitis, chronic ([place])   . Cervical dysplasia   . Coronary artery disease   . Cystocele    either on bladder or kidneys patient is unsure but stated physician will watch it  . Dizziness    If patient gets out of bed too fast  . Family history of anesthesia complication    Hard for daughter to wake up after Anesthesia  . Gout   . H/O hiatal hernia   . History of Bell's palsy   . Migraine headache   . Osteopenia   . Rosacea conjunctivitis    Right eye is worse than left eye  . Seasonal allergies   . Shortness of breath    with exertion  . Thyroid disease    Hypo  . Tobacco abuse   . Unspecified vitamin D deficiency     Past  Surgical History:  Procedure Laterality Date  . ABDOMINAL AORTIC ENDOVASCULAR STENT GRAFT N/A 04/11/2013   Procedure: ABDOMINAL AORTIC ENDOVASCULAR STENT GRAFT WITH RIGHT FEMORAL PATCH ANGIOPLASTY;  Surgeon: Mal Misty, MD;  Location: [facility];  Service: Vascular;  Laterality: N/A;  . BREAST SURGERY     LEFT BREAST BIOPSY  . broken leg Left 1970s  . CARDIAC CATHETERIZATION    . CHOLECYSTECTOMY  1972  . COLONOSCOPY    . EYE SURGERY Bilateral   . Laser vein procedure    . REFRACTIVE SURGERY    . TONSILLECTOMY       reports that she quit smoking about 13 years ago. Her smoking use included Cigarettes. She has never used smokeless tobacco. She reports that she drinks alcohol. She reports that she does not use drugs.  Allergies  Allergen Reactions  . Amiodarone Other (See Comments)    PULMONARY TOXICITY  . Diovan [Valsartan] Other (See Comments)    HYPOTENSION  . Doxycycline Diarrhea and Other (See Comments)    VISUAL DISTURBANCE  . Flexeril [Cyclobenzaprine] Other (See Comments)    FATIGUE  . Keflex [Cephalexin] Diarrhea  . Verapamil Other (See Comments)    EDEMA  . Advair Diskus [Fluticasone-Salmeterol] Other (See  Comments)    SKIN CHANGES.  Can tolerate low dose of prednisone without complications  . Codeine Other (See Comments)    unknown    Family History  Problem Relation Age of Onset  . Cirrhosis Mother   . Cancer Mother 52    PANCREAS  . Heart defect Sister   . Breast cancer Sister     age 74  . Heart disease Sister   . Stroke Sister   . Alcohol abuse Father   . Depression Father   . Hypertension Brother   . Hyperlipidemia Son   . Heart disease Daughter      Prior to Admission medications   Medication Sig Start Date End Date Taking? Authorizing Provider  acetaminophen (TYLENOL) 500 MG tablet Take 1,000 mg by mouth 2 (two) times daily as needed for moderate pain.   Yes Historical Provider, MD  albuterol (PROVENTIL HFA;VENTOLIN HFA) 108 (90 BASE) MCG/ACT  inhaler 2 puffs 5 minutes apart every 4 to 6 hours as needed to rescue asthma Patient taking differently: Inhale 2 puffs into the lungs every 4 (four) hours as needed for shortness of breath. Inhale 2 puffs into the lungs 5 minutes apart every 4 to 6 hours as needed to rescue asthma 08/13/13 02/03/16 Yes Melissa Smith, PA-C  allopurinol (ZYLOPRIM) 300 MG tablet TAKE 1 TABLET BY MOUTH EVERY DAY 11/27/15  Yes Vicie Mutters, PA-C  aspirin 81 MG tablet Take 81 mg by mouth daily.     Yes Historical Provider, MD  azelastine (ASTELIN) 0.1 % nasal spray Place 2 sprays into both nostrils 2 (two) times daily. Use in each nostril as directed Patient taking differently: Place 2 sprays into both nostrils at bedtime. Use in each nostril as directed 09/04/15  Yes Vicie Mutters, PA-C  digoxin (LANOXIN) 0.25 MG tablet TAKE 1 TABLET BY MOUTH DAILY 01/23/16  Yes [name], MD  diltiazem (CARDIZEM CD) 120 MG 24 hr capsule TAKE 1 CAPSULE (120 MG TOTAL) BY MOUTH DAILY. 01/26/16  Yes [name], MD  erythromycin ophthalmic ointment Place 1 application into both eyes at bedtime.   Yes Historical Provider, MD  furosemide (LASIX) 40 MG tablet Take 1 tablet (40 mg total) by mouth daily. 12/30/15  Yes Theodis Blaze, MD  gabapentin (NEURONTIN) 300 MG capsule TAKE ONE CAPSULE BY MOUTH 3 TIMES A DAY 11/14/15  Yes Vicie Mutters, PA-C  hydroxypropyl methylcellulose / hypromellose (ISOPTO TEARS / GONIOVISC) 2.5 % ophthalmic solution Place 1 drop into both eyes 3 (three) times daily.   Yes Historical Provider, MD  ibuprofen (ADVIL,MOTRIN) 200 MG tablet Take 400 mg by mouth every 8 (eight) hours as needed for moderate pain.   Yes Historical Provider, MD  ipratropium (ATROVENT) 0.02 % nebulizer solution Take 2.5 mLs (0.5 mg total) by nebulization every 6 (six) hours as needed for wheezing or shortness of breath. 12/30/15  Yes Theodis Blaze, MD  levalbuterol (XOPENEX) 1.25 MG/0.5ML nebulizer solution Take 1.25 mg by nebulization  every 6 (six) hours as needed for wheezing or shortness of breath. 12/30/15  Yes Theodis Blaze, MD  levothyroxine (SYNTHROID, LEVOTHROID) 50 MCG tablet Take 50 mcg by mouth daily. 11/29/15  Yes Historical Provider, MD  losartan (COZAAR) 100 MG tablet Take 100 mg by mouth daily.   Yes Historical Provider, MD  meclizine (ANTIVERT) 25 MG tablet Take 1 tablet (25 mg total) by mouth 3 (three) times daily as needed for dizziness. 04/18/15  Yes Courtney Forcucci, PA-C  metolazone (ZAROXOLYN) 2.5 MG tablet Take  2.5 mg by mouth daily as needed (for excess fluid).   Yes Historical Provider, MD  montelukast (SINGULAIR) 10 MG tablet TAKE 1 TABLET EVERY DAY FOR ALLERGIES 08/19/15  Yes [name], MD  potassium chloride SA (K-DUR,KLOR-CON) 20 MEQ tablet Take 2 tablets (40 mEq total) by mouth daily. Patient taking differently: Take 20 mEq by mouth daily as needed (takes with metolazone only).  05/06/15  Yes Courtney Forcucci, PA-C  pravastatin (PRAVACHOL) 40 MG tablet TAKE 1 TABLET BY MOUTH AT BEDTIME FOR CHOLESTEROL 01/05/16  Yes [name], MD  prochlorperazine (COMPAZINE) 5 MG tablet TAKE 1 TABLET BY MOUTH 3 TIMES A DAY FOR VERTIGO OR NAUSEA 09/16/15  Yes [name], MD  warfarin (COUMADIN) 2 MG tablet TAKE 1 TO 2 TABLETS BY MOUTH DAILY OR AS DIRECTED Patient taking differently: takes 2mg  on tues, thurs and sat, takes 4mg  all other days 03/05/15  Yes [name], MD    Physical Exam: Vitals:   02/03/16 2130 02/03/16 2200 02/03/16 2300 02/03/16 2330  BP: (!) 128/48 127/73 125/64 108/69  Pulse: 74 90  88  Resp: 16 19 19 17   Temp:      TempSrc:      SpO2: 96% 97%  98%  Weight:      Height:          Constitutional: NAD, calm, comfortable Vitals:   02/03/16 2130 02/03/16 2200 02/03/16 2300 02/03/16 2330  BP: (!) 128/48 127/73 125/64 108/69  Pulse: 74 90  88  Resp: 16 19 19 17   Temp:      TempSrc:      SpO2: 96% 97%  98%  Weight:      Height:       Eyes: PERRL, lids and conjunctivae  normal ENMT: Mucous membranes are moist. Posterior pharynx clear of any exudate or lesions.Normal dentition.  Neck: normal, supple, no masses, no thyromegaly Respiratory: clear to auscultation bilaterally, no wheezing, no crackles. Normal respiratory effort. No accessory muscle use.  Cardiovascular: irregular, no murmurs / rubs / gallops. No extremity edema. 2+ pedal pulses. No carotid bruits.  Abdomen: no tenderness, no masses palpated. No hepatosplenomegaly. Bowel sounds positive.  Musculoskeletal: no clubbing / cyanosis. No joint deformity upper and lower extremities. Good ROM, no contractures. Normal muscle tone.  Skin: no rashes, lesions, ulcers. No induration Neurologic: CN 2-12 grossly intact. Sensation intact, DTR normal. Strength 5/5 in all 4.  Psychiatric: Normal judgment and insight. Alert and oriented x 3. Normal mood.    Labs on Admission: I have personally reviewed following labs and imaging studies  CBC:  Recent Labs Lab 02/03/16 1816  WBC 9.2  NEUTROABS 5.0  HGB 15.2*  HCT 45.1  MCV 93.8  PLT 833   Basic Metabolic Panel:  Recent Labs Lab 02/03/16 1816  NA 132*  K 4.7  CL 92*  CO2 24  GLUCOSE 96  BUN 25*  CREATININE 1.94*  CALCIUM 10.0   GFR: Estimated Creatinine Clearance: 27.5 mL/min (by C-G formula based on SCr of 1.94 mg/dL (H)). Liver Function Tests:  Recent Labs Lab 02/03/16 1816  AST 29  ALT 18  ALKPHOS 30*  BILITOT 0.8  PROT 6.1*  ALBUMIN 3.8   No results for input(s): LIPASE, AMYLASE in the last 168 hours. No results for input(s): AMMONIA in the last 168 hours. Coagulation Profile: No results for input(s): INR, PROTIME in the last 168 hours. Cardiac Enzymes: No results for input(s): CKTOTAL, CKMB, CKMBINDEX, TROPONINI in the last 168 hours. BNP (last 3 results)  No results for input(s): PROBNP in the last 8760 hours. HbA1C: No results for input(s): HGBA1C in the last 72 hours. CBG: No results for input(s): GLUCAP in the last 168  hours. Lipid Profile: No results for input(s): CHOL, HDL, LDLCALC, TRIG, CHOLHDL, LDLDIRECT in the last 72 hours. Thyroid Function Tests: No results for input(s): TSH, T4TOTAL, FREET4, T3FREE, THYROIDAB in the last 72 hours. Anemia Panel: No results for input(s): VITAMINB12, FOLATE, FERRITIN, TIBC, IRON, RETICCTPCT in the last 72 hours. Urine analysis:    Component Value Date/Time   COLORURINE AMBER (A) 02/03/2016 2219   APPEARANCEUR CLOUDY (A) 02/03/2016 2219   LABSPEC 1.012 02/03/2016 2219   PHURINE 6.0 02/03/2016 2219   GLUCOSEU NEGATIVE 02/03/2016 2219   HGBUR NEGATIVE 02/03/2016 2219   [place] NEGATIVE 02/03/2016 [place] 02/03/2016 2219   PROTEINUR NEGATIVE 02/03/2016 2219   UROBILINOGEN 0.2 05/08/2014 1232   NITRITE NEGATIVE 02/03/2016 2219   LEUKOCYTESUR MODERATE (A) 02/03/2016 2219   Sepsis Labs: Invalid input(s): PROCALCITONIN, LACTICIDVEN No results found for this or any previous visit (from the past 240 hour(s)).   Radiological Exams on Admission: Dg Chest 2 View  Result Date: 02/03/2016 CLINICAL DATA:  Pneumonia.  Shortness of breath and dyspnea EXAM: CHEST  2 VIEW COMPARISON:  01/07/2016 FINDINGS: Mild cardiac enlargement. No pleural effusion or edema. The lungs are hyperinflated. There are coarsened interstitial markings noted bilaterally. Interval clearing of bilateral lower lobe opacities. No new findings. IMPRESSION: 1. Interval clearing of bilateral lower lobe pneumonias. Electronically Signed   By: Kerby Moors M.D.   On: 02/03/2016 15:52    EKG: Independently reviewed. ordered  Assessment/Plan Active Problems:   Atrial fibrillation (HCC)   Hypothyroidism   Hypotension   Chest pain on breathing   AKI (acute kidney injury) ([place])   pleuretic chest pain -cycle CE -suspect from residual PNA (hospitalized in October) -incentive spirometry  AKI- leading to dehydration and orthostatic hypotention -from excessive diuetics -patient  drinks 4 16 oz bottles of water/day on occasion, uses can food occasionally -had swelling this week and took metolazone x 2 days in a row  Atrial fib -continue home med Dig level ok -check INR -pharmacy to dose coumadin  Hypothyroid -recent TSH ok  ? UTI -vague symptoms -will not treat -get culture   DVT prophylaxis: coumadin Code Status: full Family Communication: patient Disposition Plan: tele obs Consults called:    [place] DO [facility] Pager [phone]  If 7PM-7AM, please contact night-coverage [email or web address] Password [facility]  02/03/2016, 11:55 PM

## 2016-02-04 ENCOUNTER — Encounter (HOSPITAL_COMMUNITY): Payer: Self-pay | Admitting: Rehabilitation

## 2016-02-04 DIAGNOSIS — E032 Hypothyroidism due to medicaments and other exogenous substances: Secondary | ICD-10-CM | POA: Diagnosis not present

## 2016-02-04 DIAGNOSIS — I951 Orthostatic hypotension: Secondary | ICD-10-CM | POA: Diagnosis not present

## 2016-02-04 DIAGNOSIS — R071 Chest pain on breathing: Secondary | ICD-10-CM | POA: Diagnosis not present

## 2016-02-04 DIAGNOSIS — I482 Chronic atrial fibrillation: Secondary | ICD-10-CM | POA: Diagnosis not present

## 2016-02-04 DIAGNOSIS — N179 Acute kidney failure, unspecified: Secondary | ICD-10-CM | POA: Diagnosis not present

## 2016-02-04 DIAGNOSIS — J45909 Unspecified asthma, uncomplicated: Secondary | ICD-10-CM | POA: Diagnosis not present

## 2016-02-04 DIAGNOSIS — I950 Idiopathic hypotension: Secondary | ICD-10-CM | POA: Diagnosis not present

## 2016-02-04 LAB — TROPONIN I
TROPONIN I: 0.03 ng/mL — AB (ref <0.03)
TROPONIN I: 0.04 ng/mL — AB (ref <0.03)
Troponin I: <0.03 ng/mL (ref <0.03)

## 2016-02-04 LAB — PROTIME-INR
INR: 1.67
Prothrombin Time: 19.9 seconds — ABNORMAL HIGH (ref 11.4–15.2)

## 2016-02-04 MED ORDER — MECLIZINE HCL 25 MG PO TABS: 25.0000 mg | ORAL_TABLET | Freq: Three times a day (TID) | ORAL | Status: DC | PRN

## 2016-02-04 MED ORDER — ONDANSETRON HCL 4 MG PO TABS: 4.0000 mg | ORAL_TABLET | Freq: Four times a day (QID) | ORAL | Status: DC | PRN

## 2016-02-04 MED ORDER — SODIUM CHLORIDE 0.9% FLUSH
3.0000 mL | Freq: Two times a day (BID) | INTRAVENOUS | Status: DC
  Administered 2016-02-04 – 2016-02-05 (×3): 3 mL via INTRAVENOUS

## 2016-02-04 MED ORDER — POLYVINYL ALCOHOL 1.4 % OP SOLN
1.0000 [drp] | OPHTHALMIC | Status: DC | PRN
  Administered 2016-02-05: 1 [drp] via OPHTHALMIC
  Filled 2016-02-04 (×2): qty 15

## 2016-02-04 MED ORDER — LEVALBUTEROL HCL 1.25 MG/0.5ML IN NEBU: 1.2500 mg | INHALATION_SOLUTION | Freq: Four times a day (QID) | RESPIRATORY_TRACT | Status: DC | PRN

## 2016-02-04 MED ORDER — WARFARIN SODIUM 5 MG PO TABS
5.0000 mg | ORAL_TABLET | Freq: Once | ORAL | Status: AC
  Administered 2016-02-04: 5 mg via ORAL
  Filled 2016-02-04: qty 1

## 2016-02-04 MED ORDER — SODIUM CHLORIDE 0.9 % IV SOLN: INTRAVENOUS | Status: DC

## 2016-02-04 MED ORDER — ALLOPURINOL 300 MG PO TABS
300.0000 mg | ORAL_TABLET | Freq: Every day | ORAL | Status: DC
  Administered 2016-02-04 – 2016-02-05 (×2): 300 mg via ORAL
  Filled 2016-02-04 (×3): qty 1

## 2016-02-04 MED ORDER — HYPROMELLOSE (GONIOSCOPIC) 2.5 % OP SOLN
1.0000 [drp] | Freq: Three times a day (TID) | OPHTHALMIC | Status: DC
  Administered 2016-02-04 – 2016-02-05 (×3): 1 [drp] via OPHTHALMIC
  Filled 2016-02-04: qty 15

## 2016-02-04 MED ORDER — ONDANSETRON HCL 4 MG/2ML IJ SOLN: 4.0000 mg | Freq: Four times a day (QID) | INTRAMUSCULAR | Status: DC | PRN

## 2016-02-04 MED ORDER — ASPIRIN EC 81 MG PO TBEC
81.0000 mg | DELAYED_RELEASE_TABLET | Freq: Every day | ORAL | Status: DC
Start: 2016-02-04 — End: 2016-02-06
  Administered 2016-02-04 – 2016-02-06 (×3): 81 mg via ORAL
  Filled 2016-02-04 (×3): qty 1

## 2016-02-04 MED ORDER — PRAVASTATIN SODIUM 40 MG PO TABS
40.0000 mg | ORAL_TABLET | Freq: Every day | ORAL | Status: DC
  Administered 2016-02-04 – 2016-02-05 (×2): 40 mg via ORAL
  Filled 2016-02-04 (×2): qty 1

## 2016-02-04 MED ORDER — ERYTHROMYCIN 5 MG/GM OP OINT
1.0000 "application " | TOPICAL_OINTMENT | Freq: Every day | OPHTHALMIC | Status: DC
  Filled 2016-02-04: qty 3.5

## 2016-02-04 MED ORDER — ACETAMINOPHEN 500 MG PO TABS
1000.0000 mg | ORAL_TABLET | Freq: Two times a day (BID) | ORAL | Status: DC | PRN
  Administered 2016-02-04 – 2016-02-05 (×3): 1000 mg via ORAL
  Filled 2016-02-04 (×3): qty 2

## 2016-02-04 MED ORDER — LEVOTHYROXINE SODIUM 50 MCG PO TABS
50.0000 ug | ORAL_TABLET | Freq: Every day | ORAL | Status: DC
  Administered 2016-02-04 – 2016-02-06 (×3): 50 ug via ORAL
  Filled 2016-02-04 (×3): qty 1

## 2016-02-04 MED ORDER — GABAPENTIN 300 MG PO CAPS: 300.0000 mg | ORAL_CAPSULE | Freq: Three times a day (TID) | ORAL | Status: DC

## 2016-02-04 MED ORDER — GABAPENTIN 300 MG PO CAPS
300.0000 mg | ORAL_CAPSULE | Freq: Three times a day (TID) | ORAL | Status: DC
  Administered 2016-02-04 – 2016-02-06 (×8): 300 mg via ORAL
  Filled 2016-02-04 (×8): qty 1

## 2016-02-04 MED ORDER — AZELASTINE HCL 0.1 % NA SOLN
2.0000 | Freq: Every day | NASAL | Status: DC
  Administered 2016-02-04: 2 via NASAL
  Filled 2016-02-04: qty 30

## 2016-02-04 MED ORDER — DIGOXIN 125 MCG PO TABS
250.0000 ug | ORAL_TABLET | Freq: Every day | ORAL | Status: DC
  Administered 2016-02-04 – 2016-02-06 (×3): 250 ug via ORAL
  Filled 2016-02-04 (×3): qty 2

## 2016-02-04 MED ORDER — IPRATROPIUM BROMIDE 0.02 % IN SOLN: 0.5000 mg | Freq: Four times a day (QID) | RESPIRATORY_TRACT | Status: DC | PRN

## 2016-02-04 MED ORDER — DILTIAZEM HCL ER COATED BEADS 120 MG PO CP24
120.0000 mg | ORAL_CAPSULE | Freq: Every day | ORAL | Status: DC
  Administered 2016-02-04 – 2016-02-05 (×2): 120 mg via ORAL
  Filled 2016-02-04 (×3): qty 1

## 2016-02-04 MED ORDER — WARFARIN - PHARMACIST DOSING INPATIENT: Freq: Every day | Status: DC

## 2016-02-04 NOTE — Progress Notes (Signed)
Patient arrived in the unit accompanied by NT via stretcher. Orientation to the unit given. Patient verbalizes understanding. 

## 2016-02-04 NOTE — Evaluation (Signed)
Occupational Therapy Evaluation and Discharge Patient Details Name: Katie [name] MRN: [id number] DOB: 1940/06/28 Today's Date: 02/04/2016    History of Present Illness 75 year old woman with a history of chronic atrial fibrillation, class II heart failure,  Both systolic and diastolic, abdominal aortic aneurysm, and hypertension, sent to the ED by PCP for evaluation of hypotension   Clinical Impression   Pt reports she was independent with ADL PTA. Currently pt overall mod I for ADL and functional mobility. Educated pt on energy conservation and breathing strategies. SpO2=95% on RA throughout session. Pt planning to d/c home with intermittent supervision from her husband. No further acute OT needs identified; signing off at this time. Please re-consult if needs change. Thank you for this referral.    Follow Up Recommendations  No OT follow up;Supervision - Intermittent    Equipment Recommendations  None recommended by OT    Recommendations for Other Services       Precautions / Restrictions Precautions Precaution Comments: watch O2 and Bp Restrictions Weight Bearing Restrictions: No      Mobility Bed Mobility Overal bed mobility: Needs Assistance Bed Mobility: Supine to Sit     Supine to sit: Supervision     General bed mobility comments: use of bed rail, increased time and effort but no physical assist required  Transfers Overall transfer level: Modified independent Equipment used: None             General transfer comment: no assist to come to upright, increased time to elevate    Balance Overall balance assessment: No apparent balance deficits (not formally assessed)                                          ADL Overall ADL's : Modified independent                                       General ADL Comments: Educated pt on energy conservation strategies; pt reports she currently uses many strategies at home. SpO2=95%  on RA prior to and following activity.     Vision Vision Assessment?: No apparent visual deficits   Perception     Praxis      Pertinent Vitals/Pain Pain Assessment: No/denies pain     Hand Dominance Right   Extremity/Trunk Assessment Upper Extremity Assessment Upper Extremity Assessment: Generalized weakness   Lower Extremity Assessment Lower Extremity Assessment: Generalized weakness   Cervical / Trunk Assessment Cervical / Trunk Assessment: Normal   Communication Communication Communication: No difficulties   Cognition Arousal/Alertness: Awake/alert Behavior During Therapy: WFL for tasks assessed/performed Overall Cognitive Status: Within Functional Limits for tasks assessed                     General Comments       Exercises       Shoulder Instructions      Home Living Family/patient expects to be discharged to:: Private residence Living Arrangements: Spouse/significant other Available Help at Discharge: Family;Available PRN/intermittently Type of Home: House Home Access: Stairs to enter     Home Layout: One level     Bathroom Shower/Tub: Teacher, early years/pre: Standard Bathroom Accessibility: Yes   Home Equipment: Walker - 4 wheels          Prior Functioning/Environment Level  of Independence: Independent                 OT Problem List:     OT Treatment/Interventions:      OT Goals(Current goals can be found in the care plan section) Acute Rehab OT Goals Patient Stated Goal: return home OT Goal Formulation: All assessment and education complete, DC therapy  OT Frequency:     Barriers to D/C:            Co-evaluation              End of Session Nurse Communication: Mobility status  Activity Tolerance: Patient tolerated treatment well Patient left: in chair;with call bell/phone within reach   Time: [phone] (dovetail with PT) OT Time Calculation (min): 24 min  Charges:  OT General  Charges $OT Visit: 1 Procedure OT Evaluation $OT Eval Low Complexity: 1 Procedure G-Codes: OT G-codes **NOT FOR INPATIENT CLASS** Functional Assessment Tool Used: Clinical judgement Functional Limitation: Self care Self Care Current Status (S0630): At least 1 percent but less than 20 percent impaired, limited or restricted Self Care Goal Status (Z6010): At least 1 percent but less than 20 percent impaired, limited or restricted Self Care Discharge Status [phone]): At least 1 percent but less than 20 percent impaired, limited or restricted   Binnie Kand M.S., OTR/L Pager: [phone]  02/04/2016, 5:16 PM

## 2016-02-04 NOTE — Progress Notes (Signed)
ANTICOAGULATION CONSULT NOTE - Initial Consult  Pharmacy Consult for Coumadin Indication: atrial fibrillation  Allergies  Allergen Reactions  . Amiodarone Other (See Comments)    PULMONARY TOXICITY  . Diovan [Valsartan] Other (See Comments)    HYPOTENSION  . Doxycycline Diarrhea and Other (See Comments)    VISUAL DISTURBANCE  . Flexeril [Cyclobenzaprine] Other (See Comments)    FATIGUE  . Keflex [Cephalexin] Diarrhea  . Verapamil Other (See Comments)    EDEMA  . Advair Diskus [Fluticasone-Salmeterol] Other (See Comments)    SKIN CHANGES.  Can tolerate low dose of prednisone without complications  . Codeine Other (See Comments)    unknown    Patient Measurements: Height: 5\' 3"  (160 cm) Weight: 198 lb (89.8 kg) IBW/kg (Calculated) : 52.4  Vital Signs: Temp: 97.4 F (36.3 C) (11/21 2015) Temp Source: Axillary (11/21 2015) BP: 126/59 (11/22 0000) Pulse Rate: 90 (11/22 0000)  Labs:  Recent Labs  02/03/16 1816  HGB 15.2*  HCT 45.1  PLT 287  CREATININE 1.94*    Estimated Creatinine Clearance: 27.5 mL/min (by C-G formula based on SCr of 1.94 mg/dL (H)).   Medical History: Past Medical History:  Diagnosis Date  . Aortic stenosis    mild AS witm mild to mod AR by 11/2012 echo  . Arthritis   . Atrial fibrillation ([place])   . Atrial flutter ([place])   . Bronchitis, chronic ([place])   . Cervical dysplasia   . Coronary artery disease   . Cystocele    either on bladder or kidneys patient is unsure but stated physician will watch it  . Dizziness    If patient gets out of bed too fast  . Family history of anesthesia complication    Hard for daughter to wake up after Anesthesia  . Gout   . H/O hiatal hernia   . History of Bell's palsy   . Migraine headache   . Osteopenia   . Rosacea conjunctivitis    Right eye is worse than left eye  . Seasonal allergies   . Shortness of breath    with exertion  . Thyroid disease    Hypo  . Tobacco abuse   . Unspecified vitamin D  deficiency     Assessment: 75yo female sent to ED for orthostasis, improved in ED w/ IVF, found w/ AKI amd admitted for further w/u, to continue Coumadin for Afib; last dose of Coumadin taken 11/21 PTA.  Goal of Therapy:  INR 2-3   Plan:  Will obtain current INR prior to dosing Coumadin.  Wynona Neat, PharmD, BCPS  02/04/2016,12:29 AM

## 2016-02-04 NOTE — Progress Notes (Signed)
[facility] for Coumadin Indication: atrial fibrillation  Allergies  Allergen Reactions  . Amiodarone Other (See Comments)    PULMONARY TOXICITY  . Diovan [Valsartan] Other (See Comments)    HYPOTENSION  . Doxycycline Diarrhea and Other (See Comments)    VISUAL DISTURBANCE  . Flexeril [Cyclobenzaprine] Other (See Comments)    FATIGUE  . Keflex [Cephalexin] Diarrhea  . Verapamil Other (See Comments)    EDEMA  . Advair Diskus [Fluticasone-Salmeterol] Other (See Comments)    SKIN CHANGES.  Can tolerate low dose of prednisone without complications  . Codeine Other (See Comments)    unknown    Patient Measurements: Height: 5' 3.5" (161.3 cm) Weight: 196 lb (88.9 kg) (c scale) IBW/kg (Calculated) : 53.55  Vital Signs: Temp: 97.5 F (36.4 C) (11/22 0522) Temp Source: Oral (11/22 0116) BP: 108/72 (11/22 0522) Pulse Rate: 100 (11/22 0522)  Labs:  Recent Labs  02/03/16 1816 02/04/16 0217 02/04/16 0746  HGB 15.2*  --   --   HCT 45.1  --   --   PLT 287  --   --   LABPROT  --  19.9*  --   INR  --  1.67  --   CREATININE 1.94*  --   --   TROPONINI  --  0.03* 0.04*    Estimated Creatinine Clearance: 27.6 mL/min (by C-G formula based on SCr of 1.94 mg/dL (H)).    Assessment: 75yo female sent to ED for orthostasis, improved in ED w/ IVF, found w/ AKI amd admitted for further w/u, to continue Coumadin for Afib; last dose of Coumadin taken 11/21 PTA.  Admit INR low at 1.67 Dose PTA = 2 mg TThSat, 4 mg other days  Goal of Therapy:  INR 2-3   Plan:  Coumadin 5 mg po x 1 tonight Daily INR Add prophylactic Lovenox?  Thank you Anette Guarneri, PharmD [phone] 02/04/2016,10:31 AM

## 2016-02-04 NOTE — Progress Notes (Addendum)
PT Cancellation Note  Patient Details Name: Katie [name] MRN: [id number] DOB: 05-16-1940   Cancelled Treatment:    Reason Eval/Treat Not Completed: Medical issues which prohibited therapy. Pt with elevated troponin today (increasing from 0.03 to 0.04 this AM). PT will continue to f/u with pt as appropriate and medically stable for PT evaluation.  [place] 02/04/2016, 12:25 PM Sherie Don, [place], DPT [phone]

## 2016-02-04 NOTE — Evaluation (Signed)
Physical Therapy Evaluation Patient Details Name: Katie [name] MRN: [id number] DOB: 02-12-1941 Today's Date: 02/04/2016   History of Present Illness  75 year old woman with a history of chronic atrial fibrillation, class II heart failure,  Both systolic and diastolic, abdominal aortic aneurysm, and hypertension, sent to the ED by PCP for evaluation of hypotension  Clinical Impression  Patient seen for mobility assessment and education. At this time, patient mobilizing well, denies SOB on room air with saturations >94%. Patient educated on energy conservation and safety with mobility. Encouraged to mobilize with staff. No further acute needs, will sign off.    Follow Up Recommendations No PT follow up;Supervision - Intermittent    Equipment Recommendations       Recommendations for Other Services       Precautions / Restrictions Precautions Precaution Comments: watch O2 and Bp Restrictions Weight Bearing Restrictions: No      Mobility  Bed Mobility Overal bed mobility: Needs Assistance Bed Mobility: Supine to Sit     Supine to sit: Supervision     General bed mobility comments: use of bed rail, increased time and effort but no physical assist required  Transfers Overall transfer level: Modified independent Equipment used: None             General transfer comment: no assist to come to upright, increased time to elevate  Ambulation/Gait Ambulation/Gait assistance: Supervision Ambulation Distance (Feet): 210 Feet Assistive device: None Gait Pattern/deviations: WFL(Within Functional Limits) Gait velocity: decreased   General Gait Details: decreased gait speed, modest instability but no cues or physical assist required. 2 standing rest breaks, ambulated on room air with O2 sats stable >94%  Stairs            Wheelchair Mobility    Modified Rankin (Stroke Patients Only)       Balance Overall balance assessment: No apparent balance deficits (not  formally assessed)                                           Pertinent Vitals/Pain      Home Living Family/patient expects to be discharged to:: Private residence Living Arrangements: Spouse/significant other Available Help at Discharge: Family;Available PRN/intermittently Type of Home: House Home Access: Stairs to enter     Home Layout: One level Home Equipment: Environmental consultant - 4 wheels      Prior Function Level of Independence: Independent               Hand Dominance   Dominant Hand: Right    Extremity/Trunk Assessment   Upper Extremity Assessment: Generalized weakness           Lower Extremity Assessment: Generalized weakness         Communication   Communication: No difficulties  Cognition Arousal/Alertness: Awake/alert Behavior During Therapy: WFL for tasks assessed/performed Overall Cognitive Status: Within Functional Limits for tasks assessed                      General Comments      Exercises     Assessment/Plan    PT Assessment Patent does not need any further PT services  PT Problem List            PT Treatment Interventions      PT Goals (Current goals can be found in the Care Plan section)  Acute Rehab PT Goals PT Goal Formulation:  All assessment and education complete, DC therapy    Frequency     Barriers to discharge        Co-evaluation               End of Session   Activity Tolerance: Patient tolerated treatment well Patient left: in chair;with call bell/phone within reach Nurse Communication: Mobility status    Functional Assessment Tool Used: clinical judgement Functional Limitation: Mobility: Walking and moving around Mobility: Walking and Moving Around Current Status (R6789): At least 1 percent but less than 20 percent impaired, limited or restricted Mobility: Walking and Moving Around Goal Status [phone]): At least 1 percent but less than 20 percent impaired, limited or  restricted Mobility: Walking and Moving Around Discharge Status [phone]): At least 1 percent but less than 20 percent impaired, limited or restricted    Time: 5852-7782 PT Time Calculation (min) (ACUTE ONLY): 24 min   Charges:   PT Evaluation $PT Eval Low Complexity: 1 Procedure     PT G Codes:   PT G-Codes **NOT FOR INPATIENT CLASS** Functional Assessment Tool Used: clinical judgement Functional Limitation: Mobility: Walking and moving around Mobility: Walking and Moving Around Current Status (U2353): At least 1 percent but less than 20 percent impaired, limited or restricted Mobility: Walking and Moving Around Goal Status [phone]): At least 1 percent but less than 20 percent impaired, limited or restricted Mobility: Walking and Moving Around Discharge Status [phone]): At least 1 percent but less than 20 percent impaired, limited or restricted    Duncan Dull 02/04/2016, 5:13 PM Alben Deeds, [place] DPT  [phone]

## 2016-02-04 NOTE — Progress Notes (Signed)
1000 scheduled medicated eye gtts requested from pharmacy, as they are not available in pt med bin.

## 2016-02-04 NOTE — Progress Notes (Addendum)
[facility] PROGRESS NOTE  Katie [name] HUD:[id number] DOB: Jan 21, 1941 DOA: 02/03/2016   PCP: [name], MD     Assessment/Plan: Active Problems:   Atrial fibrillation (HCC)   Hypothyroidism   Hypotension   Chest pain on breathing   AKI (acute kidney injury) ([place])   75 year old woman with a history of chronic atrial fibrillation, class II heart failure,  Both systolic and diastolic, abdominal aortic aneurysm, and hypertension, sent to the ED by PCP for evaluation of hypotension. Patient has been taking Zaroxolyn.  Assessment and plan Orthostatic hypotension-continue IV fluids, increased rate to 100 mL an hour Likely in the setting of diuretic use/ACE inhibitor use, other antihypertensives Hold antihypertensives and diuretics PT OT evaluation  Hyperlipidemia-diet controlled  Chronic atrial fibrillation-currently rate controlled on digoxin, Cardizem, INR subtherapeutic, Coumadin dosing per pharmacy  Pleuritic chest pain-cardiac enzymes negative 2 Repeat chest x-ray shows no pneumonia   Acute on chronic kidney disease stage III-baseline creatinine around 1.4- leading to dehydration and orthostatic hypotention. Patient presented with a creatinine of 1.94 -from excessive diuetics Hold diuretics/Lasix  Hypothyroidism, continue Synthroid    DVT prophylaxsis Coumadin  Code Status:  Full code     Family Communication: Discussed in detail with the patient, all imaging results, lab results explained to the patient   Disposition Plan: 1-2 days       Consultants:  None  Procedures:  None  Antibiotics: Anti-infectives    None         HPI/Subjective: Patient generally does not feel well, complains of being lightheaded  Objective: Vitals:   02/04/16 0000 02/04/16 0101 02/04/16 0116 02/04/16 0522  BP: 126/59  (!) 138/48 108/72  Pulse: 90  91 100  Resp: 16  18 18   Temp:   97.6 F (36.4 C) 97.5 F (36.4 C)  TempSrc:   Oral    SpO2: 97%  94% 94%  Weight:  88.9 kg (196 lb)    Height:  5' 3.5" (1.613 m)      Intake/Output Summary (Last 24 hours) at 02/04/16 0830 Last data filed at 02/04/16 0700  Gross per 24 hour  Intake              120 ml  Output              200 ml  Net              -80 ml    Exam:  Examination:  General exam: Appears calm and comfortable  Respiratory system: Clear to auscultation. Respiratory effort normal. Cardiovascular system: S1 & S2 heard, RRR. No JVD, murmurs, rubs, gallops or clicks. No pedal edema. Gastrointestinal system: Abdomen is nondistended, soft and nontender. No organomegaly or masses felt. Normal bowel sounds heard. Central nervous system: Alert and oriented. No focal neurological deficits. Extremities: Symmetric 5 x 5 power. Skin: No rashes, lesions or ulcers Psychiatry: Judgement and insight appear normal. Mood & affect appropriate.     Data Reviewed: I have personally reviewed following labs and imaging studies  Micro Results No results found for this or any previous visit (from the past 240 hour(s)).  Radiology Reports Dg Chest 2 View  Result Date: 02/03/2016 CLINICAL DATA:  Pneumonia.  Shortness of breath and dyspnea EXAM: CHEST  2 VIEW COMPARISON:  01/07/2016 FINDINGS: Mild cardiac enlargement. No pleural effusion or edema. The lungs are hyperinflated. There are coarsened interstitial markings noted bilaterally. Interval clearing of bilateral lower lobe opacities. No new findings. IMPRESSION: 1.  Interval clearing of bilateral lower lobe pneumonias. Electronically Signed   By: Kerby Moors M.D.   On: 02/03/2016 15:52   Dg Chest 2 View  Result Date: 01/08/2016 CLINICAL DATA:  Followup pneumonia. Right chest pain. History of hypertension. EXAM: CHEST  2 VIEW COMPARISON:  12/30/2015 FINDINGS: Irregular interstitial opacities in the right lung base, within the right lower lobe, have improved but not completely resolved. There are more diffuse irregularly  thickened interstitial markings bilaterally which are stable. There are no new areas of lung opacity. Lungs remain mildly hyperexpanded. No pleural effusion or pneumothorax. Cardiac silhouette is mildly enlarged. No mediastinal or hilar masses. IMPRESSION: 1. Additional improvement noted in the right lower lobe infiltrate. Some reticular type opacities persist which may reflect residual pneumonia or postinflammatory atelectasis. 2. No new abnormalities. Electronically Signed   By: Lajean Manes M.D.   On: 01/08/2016 08:15     CBC  Recent Labs Lab 02/03/16 1816  WBC 9.2  HGB 15.2*  HCT 45.1  PLT 287  MCV 93.8  MCH 31.6  MCHC 33.7  RDW 14.6  LYMPHSABS 3.0  MONOABS 0.9  EOSABS 0.2  BASOSABS 0.1    Chemistries   Recent Labs Lab 02/03/16 1816  NA 132*  K 4.7  CL 92*  CO2 24  GLUCOSE 96  BUN 25*  CREATININE 1.94*  CALCIUM 10.0  AST 29  ALT 18  ALKPHOS 30*  BILITOT 0.8   ------------------------------------------------------------------------------------------------------------------ estimated creatinine clearance is 27.6 mL/min (by C-G formula based on SCr of 1.94 mg/dL (H)). ------------------------------------------------------------------------------------------------------------------ No results for input(s): HGBA1C in the last 72 hours. ------------------------------------------------------------------------------------------------------------------ No results for input(s): CHOL, HDL, LDLCALC, TRIG, CHOLHDL, LDLDIRECT in the last 72 hours. ------------------------------------------------------------------------------------------------------------------ No results for input(s): TSH, T4TOTAL, T3FREE, THYROIDAB in the last 72 hours.  Invalid input(s): FREET3 ------------------------------------------------------------------------------------------------------------------ No results for input(s): VITAMINB12, FOLATE, FERRITIN, TIBC, IRON, RETICCTPCT in the last 72  hours.  Coagulation profile  Recent Labs Lab 02/04/16 0217  INR 1.67    No results for input(s): DDIMER in the last 72 hours.  Cardiac Enzymes  Recent Labs Lab 02/04/16 0217  TROPONINI 0.03*   ------------------------------------------------------------------------------------------------------------------ Invalid input(s): POCBNP   CBG: No results for input(s): GLUCAP in the last 168 hours.     Studies: Dg Chest 2 View  Result Date: 02/03/2016 CLINICAL DATA:  Pneumonia.  Shortness of breath and dyspnea EXAM: CHEST  2 VIEW COMPARISON:  01/07/2016 FINDINGS: Mild cardiac enlargement. No pleural effusion or edema. The lungs are hyperinflated. There are coarsened interstitial markings noted bilaterally. Interval clearing of bilateral lower lobe opacities. No new findings. IMPRESSION: 1. Interval clearing of bilateral lower lobe pneumonias. Electronically Signed   By: Kerby Moors M.D.   On: 02/03/2016 15:52      Lab Results  Component Value Date   HGBA1C 5.6 12/09/2015   HGBA1C 5.6 09/19/2015   HGBA1C 5.8 (H) 03/14/2015   Lab Results  Component Value Date   MICROALBUR 0.8 12/09/2015   LDLCALC 61 12/09/2015   CREATININE 1.94 (H) 02/03/2016       Scheduled Meds: . allopurinol  300 mg Oral Daily  . aspirin EC  81 mg Oral Daily  . azelastine  2 spray Each Nare QHS  . digoxin  250 mcg Oral Daily  . diltiazem  120 mg Oral Daily  . erythromycin  1 application Both Eyes QHS  . gabapentin  300 mg Oral TID  . hydroxypropyl methylcellulose / hypromellose  1 drop Both Eyes TID  . levothyroxine  50 mcg Oral QAC breakfast  . pravastatin  40 mg Oral q1800  . sodium chloride flush  3 mL Intravenous Q12H  . Warfarin - Pharmacist Dosing Inpatient   Does not apply q1800   Continuous Infusions: . sodium chloride 50 mL/hr at 02/04/16 0039     LOS: 0 days    Time spent: >30 MINS    [facility] Pager [phone]. If 7PM-7AM, please contact  night-coverage at [email or web address], password [facility] 02/04/2016, 8:30 AM  LOS: 0 days

## 2016-02-04 NOTE — Progress Notes (Signed)
Per Dr Allyson Sabal, pt has permission to utilize [facility] eye gtts from home as needed while admitted; due to no equivalent formula available at hospital pharmacy.   Pt notified. Order stating so requested from Dr. Allyson Sabal

## 2016-02-05 DIAGNOSIS — I950 Idiopathic hypotension: Secondary | ICD-10-CM | POA: Diagnosis not present

## 2016-02-05 DIAGNOSIS — R071 Chest pain on breathing: Secondary | ICD-10-CM | POA: Diagnosis not present

## 2016-02-05 DIAGNOSIS — E032 Hypothyroidism due to medicaments and other exogenous substances: Secondary | ICD-10-CM | POA: Diagnosis not present

## 2016-02-05 DIAGNOSIS — I951 Orthostatic hypotension: Secondary | ICD-10-CM | POA: Diagnosis not present

## 2016-02-05 LAB — BASIC METABOLIC PANEL
ANION GAP: 10 (ref 5–15)
BUN: 17 mg/dL (ref 6–20)
CALCIUM: 9.4 mg/dL (ref 8.9–10.3)
CO2: 24 mmol/L (ref 22–32)
Chloride: 97 mmol/L — ABNORMAL LOW (ref 101–111)
Creatinine, Ser: 1.31 mg/dL — ABNORMAL HIGH (ref 0.44–1.00)
GFR calc non Af Amer: 39 mL/min — ABNORMAL LOW (ref >60)
GFR, EST AFRICAN AMERICAN: 46 mL/min — AB (ref >60)
Glucose, Bld: 111 mg/dL — ABNORMAL HIGH (ref 65–99)
Potassium: 3.6 mmol/L (ref 3.5–5.1)
SODIUM: 131 mmol/L — AB (ref 135–145)

## 2016-02-05 LAB — CBC
HCT: 38.9 % (ref 36.0–46.0)
Hemoglobin: 13.3 g/dL (ref 12.0–15.0)
MCH: 31.5 pg (ref 26.0–34.0)
MCHC: 34.2 g/dL (ref 30.0–36.0)
MCV: 92.2 fL (ref 78.0–100.0)
PLATELETS: 241 10*3/uL (ref 150–400)
RBC: 4.22 MIL/uL (ref 3.87–5.11)
RDW: 14.7 % (ref 11.5–15.5)
WBC: 6 10*3/uL (ref 4.0–10.5)

## 2016-02-05 LAB — COMPREHENSIVE METABOLIC PANEL
ALBUMIN: 3.1 g/dL — AB (ref 3.5–5.0)
ALT: 16 U/L (ref 14–54)
AST: 20 U/L (ref 15–41)
Alkaline Phosphatase: 26 U/L — ABNORMAL LOW (ref 38–126)
Anion gap: 10 (ref 5–15)
BUN: 20 mg/dL (ref 6–20)
CHLORIDE: 98 mmol/L — AB (ref 101–111)
CO2: 24 mmol/L (ref 22–32)
Calcium: 9.3 mg/dL (ref 8.9–10.3)
Creatinine, Ser: 1.45 mg/dL — ABNORMAL HIGH (ref 0.44–1.00)
GFR calc Af Amer: 40 mL/min — ABNORMAL LOW (ref >60)
GFR, EST NON AFRICAN AMERICAN: 35 mL/min — AB (ref >60)
GLUCOSE: 100 mg/dL — AB (ref 65–99)
POTASSIUM: 3.5 mmol/L (ref 3.5–5.1)
Sodium: 132 mmol/L — ABNORMAL LOW (ref 135–145)
Total Bilirubin: 0.5 mg/dL (ref 0.3–1.2)
Total Protein: 5 g/dL — ABNORMAL LOW (ref 6.5–8.1)

## 2016-02-05 LAB — T4, FREE: Free T4: 0.83 ng/dL (ref 0.61–1.12)

## 2016-02-05 LAB — URINE CULTURE

## 2016-02-05 LAB — PROTIME-INR
INR: 1.63
Prothrombin Time: 19.5 seconds — ABNORMAL HIGH (ref 11.4–15.2)

## 2016-02-05 LAB — TSH: TSH: 6.728 u[IU]/mL — AB (ref 0.350–4.500)

## 2016-02-05 MED ORDER — MIDODRINE HCL 5 MG PO TABS
5.0000 mg | ORAL_TABLET | Freq: Three times a day (TID) | ORAL | Status: DC
  Administered 2016-02-05 – 2016-02-06 (×3): 5 mg via ORAL
  Filled 2016-02-05 (×3): qty 1

## 2016-02-05 MED ORDER — WARFARIN SODIUM 5 MG PO TABS
5.0000 mg | ORAL_TABLET | Freq: Once | ORAL | Status: AC
  Administered 2016-02-05: 5 mg via ORAL
  Filled 2016-02-05: qty 1

## 2016-02-05 MED ORDER — SODIUM CHLORIDE 0.9 % IV BOLUS (SEPSIS)
250.0000 mL | Freq: Once | INTRAVENOUS | Status: AC
  Administered 2016-02-05: 250 mL via INTRAVENOUS

## 2016-02-05 NOTE — Progress Notes (Signed)
[facility] for Coumadin Indication: atrial fibrillation  Allergies  Allergen Reactions  . Amiodarone Other (See Comments)    PULMONARY TOXICITY  . Diovan [Valsartan] Other (See Comments)    HYPOTENSION  . Doxycycline Diarrhea and Other (See Comments)    VISUAL DISTURBANCE  . Flexeril [Cyclobenzaprine] Other (See Comments)    FATIGUE  . Keflex [Cephalexin] Diarrhea  . Verapamil Other (See Comments)    EDEMA  . Advair Diskus [Fluticasone-Salmeterol] Other (See Comments)    SKIN CHANGES.  Can tolerate low dose of prednisone without complications  . Codeine Other (See Comments)    unknown    Patient Measurements: Height: 5' 3.5" (161.3 cm) Weight: 200 lb 12.8 oz (91.1 kg) IBW/kg (Calculated) : 53.55  Vital Signs: Temp: 97.7 F (36.5 C) (11/23 0942) Temp Source: Oral (11/23 0942) BP: 86/52 (11/23 1056) Pulse Rate: 96 (11/23 1056)  Labs:  Recent Labs  02/03/16 1816 02/04/16 0217 02/04/16 0746 02/04/16 1304 02/05/16 0401  HGB 15.2*  --   --   --  13.3  HCT 45.1  --   --   --  38.9  PLT 287  --   --   --  241  LABPROT  --  19.9*  --   --  19.5*  INR  --  1.67  --   --  1.63  CREATININE 1.94*  --   --   --  1.45*  TROPONINI  --  0.03* 0.04* <0.03  --     Estimated Creatinine Clearance: 37.4 mL/min (by C-G formula based on SCr of 1.45 mg/dL (H)).    Assessment: 75yo female sent to ED for orthostasis, improved in ED w/ IVF, found w/ AKI amd admitted for further w/u, to continue Coumadin for Afib; last dose of Coumadin taken 11/21 PTA.  INR trended down slightly today so will repeat the higher dose. Consider bridging with lovenox. Dose PTA = 2 mg TThSat, 4 mg other days  Goal of Therapy:  INR 2-3   Plan:   Repeat coumadin 5 mg po x 1 tonight Daily INR Add prophylactic Lovenox?  Onnie Boer, PharmD, BCPS, AAHIVP, CPP Infectious Disease Pharmacist Pager: [phone] 02/05/2016 12:55 PM

## 2016-02-05 NOTE — Progress Notes (Signed)
[facility] PROGRESS NOTE  Katie [name] GHW:[id number] DOB: 04-02-1940 DOA: 02/03/2016   PCP: [name], MD     Assessment/Plan: Active Problems:   Atrial fibrillation (HCC)   Hypothyroidism   Hypotension   Chest pain on breathing   AKI (acute kidney injury) ([place])   75 year old woman with a history of chronic atrial fibrillation, class II heart failure,  Both systolic and diastolic, abdominal aortic aneurysm, and hypertension, sent to the ED by PCP for evaluation of hypotension. Patient has been taking Zaroxolyn.  Assessment and plan Orthostatic hypotension-unfortunately still orthostatic today despite IV fluids yesterday We'll give another bolus of 2 50 mL Start patient on midodrine  Continue to hold diuretics/ARB PT OT evaluation   Hyperlipidemia-diet controlled  Chronic atrial fibrillation-currently rate controlled on digoxin, Cardizem, INR subtherapeutic, Coumadin dosing per pharmacy  Pleuritic chest pain-cardiac enzymes negative 2 Repeat chest x-ray shows no pneumonia   Acute on chronic kidney disease stage III-baseline creatinine around 1.4- leading to dehydration and orthostatic hypotention. Patient presented with a creatinine of 1.94, acute kidney injury resolved, creatinine at baseline    Hypothyroidism, continue Synthroid    DVT prophylaxsis Coumadin  Code Status:  Full code     Family Communication: Discussed in detail with the patient, all imaging results, lab results explained to the patient   Disposition Plan: 1-2 days       Consultants:  None  Procedures:  None  Antibiotics: Anti-infectives    None         HPI/Subjective: Some lightheadedness with ambulation  Objective: Vitals:   02/05/16 0942 02/05/16 1054 02/05/16 1055 02/05/16 1056  BP: (!) 114/52 (!) 108/59 (!) 106/50 (!) 86/52  Pulse: 86 (!) 59 74 96  Resp: 18     Temp: 97.7 F (36.5 C)     TempSrc: Oral     SpO2: 96%   98%  Weight:       Height:        Intake/Output Summary (Last 24 hours) at 02/05/16 1203 Last data filed at 02/05/16 7893  Gross per 24 hour  Intake              480 ml  Output             1450 ml  Net             -970 ml    Exam:  Examination:  General exam: Appears calm and comfortable  Respiratory system: Clear to auscultation. Respiratory effort normal. Cardiovascular system: S1 & S2 heard, RRR. No JVD, murmurs, rubs, gallops or clicks. No pedal edema. Gastrointestinal system: Abdomen is nondistended, soft and nontender. No organomegaly or masses felt. Normal bowel sounds heard. Central nervous system: Alert and oriented. No focal neurological deficits. Extremities: Symmetric 5 x 5 power. Skin: No rashes, lesions or ulcers Psychiatry: Judgement and insight appear normal. Mood & affect appropriate.     Data Reviewed: I have personally reviewed following labs and imaging studies  Micro Results Recent Results (from the past 240 hour(s))  Urine culture     Status: Abnormal   Collection Time: 02/03/16 11:16 PM  Result Value Ref Range Status   Specimen Description URINE, CATHETERIZED  Final   Special Requests NONE  Final   Culture MULTIPLE SPECIES PRESENT, SUGGEST RECOLLECTION (A)  Final   Report Status 02/05/2016 FINAL  Final    Radiology Reports Dg Chest 2 View  Result Date: 02/03/2016 CLINICAL DATA:  Pneumonia.  Shortness of  breath and dyspnea EXAM: CHEST  2 VIEW COMPARISON:  01/07/2016 FINDINGS: Mild cardiac enlargement. No pleural effusion or edema. The lungs are hyperinflated. There are coarsened interstitial markings noted bilaterally. Interval clearing of bilateral lower lobe opacities. No new findings. IMPRESSION: 1. Interval clearing of bilateral lower lobe pneumonias. Electronically Signed   By: Kerby Moors M.D.   On: 02/03/2016 15:52   Dg Chest 2 View  Result Date: 01/08/2016 CLINICAL DATA:  Followup pneumonia. Right chest pain. History of hypertension. EXAM: CHEST  2  VIEW COMPARISON:  12/30/2015 FINDINGS: Irregular interstitial opacities in the right lung base, within the right lower lobe, have improved but not completely resolved. There are more diffuse irregularly thickened interstitial markings bilaterally which are stable. There are no new areas of lung opacity. Lungs remain mildly hyperexpanded. No pleural effusion or pneumothorax. Cardiac silhouette is mildly enlarged. No mediastinal or hilar masses. IMPRESSION: 1. Additional improvement noted in the right lower lobe infiltrate. Some reticular type opacities persist which may reflect residual pneumonia or postinflammatory atelectasis. 2. No new abnormalities. Electronically Signed   By: Lajean Manes M.D.   On: 01/08/2016 08:15     CBC  Recent Labs Lab 02/03/16 1816 02/05/16 0401  WBC 9.2 6.0  HGB 15.2* 13.3  HCT 45.1 38.9  PLT 287 241  MCV 93.8 92.2  MCH 31.6 31.5  MCHC 33.7 34.2  RDW 14.6 14.7  LYMPHSABS 3.0  --   MONOABS 0.9  --   EOSABS 0.2  --   BASOSABS 0.1  --     Chemistries   Recent Labs Lab 02/03/16 1816 02/05/16 0401  NA 132* 132*  K 4.7 3.5  CL 92* 98*  CO2 24 24  GLUCOSE 96 100*  BUN 25* 20  CREATININE 1.94* 1.45*  CALCIUM 10.0 9.3  AST 29 20  ALT 18 16  ALKPHOS 30* 26*  BILITOT 0.8 0.5   ------------------------------------------------------------------------------------------------------------------ estimated creatinine clearance is 37.4 mL/min (by C-G formula based on SCr of 1.45 mg/dL (H)). ------------------------------------------------------------------------------------------------------------------ No results for input(s): HGBA1C in the last 72 hours. ------------------------------------------------------------------------------------------------------------------ No results for input(s): CHOL, HDL, LDLCALC, TRIG, CHOLHDL, LDLDIRECT in the last 72  hours. ------------------------------------------------------------------------------------------------------------------ No results for input(s): TSH, T4TOTAL, T3FREE, THYROIDAB in the last 72 hours.  Invalid input(s): FREET3 ------------------------------------------------------------------------------------------------------------------ No results for input(s): VITAMINB12, FOLATE, FERRITIN, TIBC, IRON, RETICCTPCT in the last 72 hours.  Coagulation profile  Recent Labs Lab 02/04/16 0217 02/05/16 0401  INR 1.67 1.63    No results for input(s): DDIMER in the last 72 hours.  Cardiac Enzymes  Recent Labs Lab 02/04/16 0217 02/04/16 0746 02/04/16 1304  TROPONINI 0.03* 0.04* <0.03   ------------------------------------------------------------------------------------------------------------------ Invalid input(s): POCBNP   CBG: No results for input(s): GLUCAP in the last 168 hours.     Studies: Dg Chest 2 View  Result Date: 02/03/2016 CLINICAL DATA:  Pneumonia.  Shortness of breath and dyspnea EXAM: CHEST  2 VIEW COMPARISON:  01/07/2016 FINDINGS: Mild cardiac enlargement. No pleural effusion or edema. The lungs are hyperinflated. There are coarsened interstitial markings noted bilaterally. Interval clearing of bilateral lower lobe opacities. No new findings. IMPRESSION: 1. Interval clearing of bilateral lower lobe pneumonias. Electronically Signed   By: Kerby Moors M.D.   On: 02/03/2016 15:52      Lab Results  Component Value Date   HGBA1C 5.6 12/09/2015   HGBA1C 5.6 09/19/2015   HGBA1C 5.8 (H) 03/14/2015   Lab Results  Component Value Date   MICROALBUR 0.8 12/09/2015   LDLCALC 61 12/09/2015  CREATININE 1.45 (H) 02/05/2016       Scheduled Meds: . allopurinol  300 mg Oral Daily  . aspirin EC  81 mg Oral Daily  . azelastine  2 spray Each Nare QHS  . digoxin  250 mcg Oral Daily  . diltiazem  120 mg Oral Daily  . erythromycin  1 application Both Eyes QHS   . gabapentin  300 mg Oral TID  . hydroxypropyl methylcellulose / hypromellose  1 drop Both Eyes TID  . levothyroxine  50 mcg Oral QAC breakfast  . midodrine  5 mg Oral TID WC  . pravastatin  40 mg Oral q1800  . sodium chloride  250 mL Intravenous Once  . sodium chloride flush  3 mL Intravenous Q12H  . Warfarin - Pharmacist Dosing Inpatient   Does not apply q1800   Continuous Infusions:    LOS: 0 days    Time spent: >30 MINS    [facility]  [facility] Pager [phone]. If 7PM-7AM, please contact night-coverage at [email or web address], password [facility] 02/05/2016, 12:03 PM  LOS: 0 days

## 2016-02-06 DIAGNOSIS — E032 Hypothyroidism due to medicaments and other exogenous substances: Secondary | ICD-10-CM | POA: Diagnosis not present

## 2016-02-06 DIAGNOSIS — I951 Orthostatic hypotension: Secondary | ICD-10-CM | POA: Diagnosis not present

## 2016-02-06 DIAGNOSIS — I950 Idiopathic hypotension: Secondary | ICD-10-CM | POA: Diagnosis not present

## 2016-02-06 LAB — PROTIME-INR
INR: 2.05
Prothrombin Time: 23.4 seconds — ABNORMAL HIGH (ref 11.4–15.2)

## 2016-02-06 MED ORDER — FUROSEMIDE 40 MG PO TABS: 40.0000 mg | ORAL_TABLET | Freq: Every day | ORAL | 0 refills | Status: DC

## 2016-02-06 MED ORDER — MIDODRINE HCL 5 MG PO TABS: 5.0000 mg | ORAL_TABLET | Freq: Three times a day (TID) | ORAL | 0 refills | Status: DC

## 2016-02-06 MED ORDER — HYDROCODONE-ACETAMINOPHEN 5-325 MG PO TABS
1.0000 | ORAL_TABLET | Freq: Once | ORAL | Status: AC
  Administered 2016-02-06: 1 via ORAL
  Filled 2016-02-06: qty 1

## 2016-02-06 MED ORDER — DILTIAZEM HCL 30 MG PO TABS: 30.0000 mg | ORAL_TABLET | Freq: Two times a day (BID) | ORAL | 1 refills | Status: DC

## 2016-02-06 MED ORDER — WARFARIN SODIUM 2 MG PO TABS: 2.0000 mg | ORAL_TABLET | Freq: Once | ORAL | Status: DC

## 2016-02-06 NOTE — Progress Notes (Signed)
[place] for Coumadin Indication: atrial fibrillation  Allergies  Allergen Reactions  . Amiodarone Other (See Comments)    PULMONARY TOXICITY  . Diovan [Valsartan] Other (See Comments)    HYPOTENSION  . Doxycycline Diarrhea and Other (See Comments)    VISUAL DISTURBANCE  . Flexeril [Cyclobenzaprine] Other (See Comments)    FATIGUE  . Keflex [Cephalexin] Diarrhea  . Verapamil Other (See Comments)    EDEMA  . Advair Diskus [Fluticasone-Salmeterol] Other (See Comments)    SKIN CHANGES.  Can tolerate low dose of prednisone without complications  . Codeine Other (See Comments)    unknown    Patient Measurements: Height: 5' 3.5" (161.3 cm) Weight: 201 lb 1.6 oz (91.2 kg) (scale c) IBW/kg (Calculated) : 53.55  Vital Signs: Temp: 97.6 F (36.4 C) (11/24 0520) Temp Source: Oral (11/24 0520) BP: 109/71 (11/24 0520) Pulse Rate: 86 (11/24 0520)  Labs:  Recent Labs  02/03/16 1816 02/04/16 0217 02/04/16 0746 02/04/16 1304 02/05/16 0401 02/05/16 1217 02/06/16 0300  HGB 15.2*  --   --   --  13.3  --   --   HCT 45.1  --   --   --  38.9  --   --   PLT 287  --   --   --  241  --   --   LABPROT  --  19.9*  --   --  19.5*  --  23.4*  INR  --  1.67  --   --  1.63  --  2.05  CREATININE 1.94*  --   --   --  1.45* 1.31*  --   TROPONINI  --  0.03* 0.04* <0.03  --   --   --     Estimated Creatinine Clearance: 41.4 mL/min (by C-G formula based on SCr of 1.31 mg/dL (H)).    Assessment: 75yo female sent to ED for orthostasis, improved in ED w/ IVF, found w/ AKI amd admitted for further w/u, to continue Coumadin for Afib; last dose of Coumadin taken 11/21 PTA.  INR now trending up today so will back down on dosing. No bleeding issues noted overnight.   Dose PTA = 2 mg TThSat, 4 mg other days  Goal of Therapy:  INR 2-3   Plan:  Warfarin 2mg  tonight Daily INR for now  Erin Hearing PharmD., BCPS Clinical Pharmacist Pager  [phone] 02/06/2016 10:04 AM

## 2016-02-06 NOTE — Consult Note (Signed)
   [facility] CM Inpatient Consult   02/06/2016  KAYA POTTENGER February 09, 1941 [id number]   Patient screened for potential [facility] services this is the patient's 2nd admission in the past 60 days. Patient is eligible for [facility] Care Management services under patient's [facility] plan. Met with the patient who states she weighs daily but she feels she is eating too much salty foods.  She states she has concerns about her diet.  Explained [facility]'s role in post hospital follow up.   Patient states, "I've got a lot going on right now and coming back here [hospital] is overwhelming and costly. Explained [facility]'s role again for follow up.  Patient states, "I will take your brochure."  Information given on How to Read a Label/High Salt/Low salt diet.  Patient declined services at this time but states she will call.  She states, "I see my doctor monthly and they do my labs and all so I don't think I need it right now."   For questions contact:   Natividad Brood, RN BSN [facility] Liaison  [phone] business mobile phone Toll free office [phone]

## 2016-02-06 NOTE — Progress Notes (Signed)
Pt has orders to be discharged. Discharge instructions given and pt has no additional questions at this time. Medication regimen reviewed and pt educated. Pt verbalized understanding and has no additional questions. Telemetry box removed. IV removed and site in good condition. Pt stable and waiting for transportation.  Shaida Route RN 

## 2016-02-06 NOTE — Discharge Instructions (Signed)

## 2016-02-06 NOTE — Discharge Summary (Addendum)
Physician Discharge Summary  Katie [name] MRN: [id number] DOB/AGE: 1940-07-13 75 y.o.  PCP: [name], MD   Admit date: 02/03/2016 Discharge date: 02/06/2016  Discharge Diagnoses:    Active Problems:   Atrial fibrillation (HCC)   Hypothyroidism   Hypotension   Chest pain on breathing   AKI (acute kidney injury) ([place]) Orthostatic hypotension   Follow-up recommendations Follow-up with PCP in 3-5 days , including all  additional recommended appointments as below Follow-up CBC, CMP in 3-5 days Resumed Lasix if renal function is stable after PCP follow-up Cozaar and Zaroxolyn on hold May consider stopping midodrine if orthostatic hypotension resolves  Check INR 11/27    Current Discharge Medication List    START taking these medications   Details  diltiazem (CARDIZEM) 30 MG tablet Take 1 tablet (30 mg total) by mouth 2 (two) times daily. Qty: 60 tablet, Refills: 1    midodrine (PROAMATINE) 5 MG tablet Take 1 tablet (5 mg total) by mouth 3 (three) times daily with meals. Qty: 90 tablet, Refills: 0      CONTINUE these medications which have CHANGED   Details  furosemide (LASIX) 40 MG tablet Take 1 tablet (40 mg total) by mouth daily. Qty: 30 tablet, Refills: 0      CONTINUE these medications which have NOT CHANGED   Details  acetaminophen (TYLENOL) 500 MG tablet Take 1,000 mg by mouth 2 (two) times daily as needed for moderate pain.    albuterol (PROVENTIL HFA;VENTOLIN HFA) 108 (90 BASE) MCG/ACT inhaler 2 puffs 5 minutes apart every 4 to 6 hours as needed to rescue asthma Qty: 1 Inhaler, Refills: 11   Associated Diagnoses: Intrinsic asthma, unspecified    allopurinol (ZYLOPRIM) 300 MG tablet TAKE 1 TABLET BY MOUTH EVERY DAY Qty: 90 tablet, Refills: 1    aspirin 81 MG tablet Take 81 mg by mouth daily.      azelastine (ASTELIN) 0.1 % nasal spray Place 2 sprays into both nostrils 2 (two) times daily. Use in each nostril as directed Qty: 30 mL,  Refills: 2   Associated Diagnoses: Recurrent sinusitis    digoxin (LANOXIN) 0.25 MG tablet TAKE 1 TABLET BY MOUTH DAILY Qty: 90 tablet, Refills: 1    erythromycin ophthalmic ointment Place 1 application into both eyes at bedtime.    gabapentin (NEURONTIN) 300 MG capsule TAKE ONE CAPSULE BY MOUTH 3 TIMES A DAY Qty: 270 capsule, Refills: 1    hydroxypropyl methylcellulose / hypromellose (ISOPTO TEARS / GONIOVISC) 2.5 % ophthalmic solution Place 1 drop into both eyes 3 (three) times daily.    ipratropium (ATROVENT) 0.02 % nebulizer solution Take 2.5 mLs (0.5 mg total) by nebulization every 6 (six) hours as needed for wheezing or shortness of breath. Qty: 500 mL, Refills: 1    levalbuterol (XOPENEX) 1.25 MG/0.5ML nebulizer solution Take 1.25 mg by nebulization every 6 (six) hours as needed for wheezing or shortness of breath. Qty: 1 each, Refills: 3    levothyroxine (SYNTHROID, LEVOTHROID) 50 MCG tablet Take 50 mcg by mouth daily.    meclizine (ANTIVERT) 25 MG tablet Take 1 tablet (25 mg total) by mouth 3 (three) times daily as needed for dizziness. Qty: 60 tablet, Refills: 1   Associated Diagnoses: Dizziness and giddiness    montelukast (SINGULAIR) 10 MG tablet TAKE 1 TABLET EVERY DAY FOR ALLERGIES Qty: 90 tablet, Refills: 1    potassium chloride SA (K-DUR,KLOR-CON) 20 MEQ tablet Take 2 tablets (40 mEq total) by mouth daily. Qty: 30 tablet, Refills: 0  pravastatin (PRAVACHOL) 40 MG tablet TAKE 1 TABLET BY MOUTH AT BEDTIME FOR CHOLESTEROL Qty: 90 tablet, Refills: 1    prochlorperazine (COMPAZINE) 5 MG tablet TAKE 1 TABLET BY MOUTH 3 TIMES A DAY FOR VERTIGO OR NAUSEA Qty: 90 tablet, Refills: 1    warfarin (COUMADIN) 2 MG tablet TAKE 1 TO 2 TABLETS BY MOUTH DAILY OR AS DIRECTED Qty: 180 tablet, Refills: 3      STOP taking these medications     diltiazem (CARDIZEM CD) 120 MG 24 hr capsule      ibuprofen (ADVIL,MOTRIN) 200 MG tablet      losartan (COZAAR) 100 MG tablet       metolazone (ZAROXOLYN) 2.5 MG tablet          Discharge Condition:Stable Discharge Instructions Get Medicines reviewed and adjusted: Please take all your medications with you for your next visit with your Primary MD  Please request your Primary MD to go over all hospital tests and procedure/radiological results at the follow up, please ask your Primary MD to get all Hospital records sent to his/her office.  If you experience worsening of your admission symptoms, develop shortness of breath, life threatening emergency, suicidal or homicidal thoughts you must seek medical attention immediately by calling 911 or calling your MD immediately if symptoms less severe.  You must read complete instructions/literature along with all the possible adverse reactions/side effects for all the Medicines you take and that have been prescribed to you. Take any new Medicines after you have completely understood and accpet all the possible adverse reactions/side effects.   Do not drive when taking Pain medications.   Do not take more than prescribed Pain, Sleep and Anxiety Medications  Special Instructions: If you have smoked or chewed Tobacco in the last 2 yrs please stop smoking, stop any regular Alcohol and or any Recreational drug use.  Wear Seat belts while driving.  Please note  You were cared for by a hospitalist during your hospital stay. Once you are discharged, your primary care physician will handle any further medical issues. Please note that NO REFILLS for any discharge medications will be authorized once you are discharged, as it is imperative that you return to your primary care physician (or establish a relationship with a primary care physician if you do not have one) for your aftercare needs so that they can reassess your need for medications and monitor your lab values.     Allergies  Allergen Reactions  . Amiodarone Other (Katie Comments)    PULMONARY TOXICITY  . Diovan [Valsartan]  Other (Katie Comments)    HYPOTENSION  . Doxycycline Diarrhea and Other (Katie Comments)    VISUAL DISTURBANCE  . Flexeril [Cyclobenzaprine] Other (Katie Comments)    FATIGUE  . Keflex [Cephalexin] Diarrhea  . Verapamil Other (Katie Comments)    EDEMA  . Advair Diskus [Fluticasone-Salmeterol] Other (Katie Comments)    SKIN CHANGES.  Can tolerate low dose of prednisone without complications  . Codeine Other (Katie Comments)    unknown      Disposition: 01-Home or Self Care   Consults:  None   Significant Diagnostic Studies:  Dg Chest 2 View  Result Date: 02/03/2016 CLINICAL DATA:  Pneumonia.  Shortness of breath and dyspnea EXAM: CHEST  2 VIEW COMPARISON:  01/07/2016 FINDINGS: Mild cardiac enlargement. No pleural effusion or edema. The lungs are hyperinflated. There are coarsened interstitial markings noted bilaterally. Interval clearing of bilateral lower lobe opacities. No new findings. IMPRESSION: 1. Interval clearing of bilateral  lower lobe pneumonias. Electronically Signed   By: Kerby Moors M.D.   On: 02/03/2016 15:52   Dg Chest 2 View  Result Date: 01/08/2016 CLINICAL DATA:  Followup pneumonia. Right chest pain. History of hypertension. EXAM: CHEST  2 VIEW COMPARISON:  12/30/2015 FINDINGS: Irregular interstitial opacities in the right lung base, within the right lower lobe, have improved but not completely resolved. There are more diffuse irregularly thickened interstitial markings bilaterally which are stable. There are no new areas of lung opacity. Lungs remain mildly hyperexpanded. No pleural effusion or pneumothorax. Cardiac silhouette is mildly enlarged. No mediastinal or hilar masses. IMPRESSION: 1. Additional improvement noted in the right lower lobe infiltrate. Some reticular type opacities persist which may reflect residual pneumonia or postinflammatory atelectasis. 2. No new abnormalities. Electronically Signed   By: Lajean Manes M.D.   On: 01/08/2016 08:15        Filed  Weights   02/04/16 0101 02/05/16 0440 02/06/16 0520  Weight: 88.9 kg (196 lb) 91.1 kg (200 lb 12.8 oz) 91.2 kg (201 lb 1.6 oz)     Microbiology: Recent Results (from the past 240 hour(s))  Urine culture     Status: Abnormal   Collection Time: 02/03/16 11:16 PM  Result Value Ref Range Status   Specimen Description URINE, CATHETERIZED  Final   Special Requests NONE  Final   Culture MULTIPLE SPECIES PRESENT, SUGGEST RECOLLECTION (A)  Final   Report Status 02/05/2016 FINAL  Final       Blood Culture    Component Value Date/Time   SDES URINE, CATHETERIZED 02/03/2016 2316   SPECREQUEST NONE 02/03/2016 2316   CULT MULTIPLE SPECIES PRESENT, SUGGEST RECOLLECTION (A) 02/03/2016 2316   REPTSTATUS 02/05/2016 FINAL 02/03/2016 2316      Labs: Results for orders placed or performed during the hospital encounter of 02/03/16 (from the past 48 hour(s))  Troponin I     Status: None   Collection Time: 02/04/16  1:04 PM  Result Value Ref Range   Troponin I <0.03 <0.03 ng/mL  Protime-INR     Status: Abnormal   Collection Time: 02/05/16  4:01 AM  Result Value Ref Range   Prothrombin Time 19.5 (H) 11.4 - 15.2 seconds   INR 1.63   CBC     Status: None   Collection Time: 02/05/16  4:01 AM  Result Value Ref Range   WBC 6.0 4.0 - 10.5 K/uL   RBC 4.22 3.87 - 5.11 MIL/uL   Hemoglobin 13.3 12.0 - 15.0 g/dL   HCT 38.9 36.0 - 46.0 %   MCV 92.2 78.0 - 100.0 fL   MCH 31.5 26.0 - 34.0 pg   MCHC 34.2 30.0 - 36.0 g/dL   RDW 14.7 11.5 - 15.5 %   Platelets 241 150 - 400 K/uL  Comprehensive metabolic panel     Status: Abnormal   Collection Time: 02/05/16  4:01 AM  Result Value Ref Range   Sodium 132 (L) 135 - 145 mmol/L   Potassium 3.5 3.5 - 5.1 mmol/L    Comment: DELTA CHECK NOTED   Chloride 98 (L) 101 - 111 mmol/L   CO2 24 22 - 32 mmol/L   Glucose, Bld 100 (H) 65 - 99 mg/dL   BUN 20 6 - 20 mg/dL   Creatinine, Ser 1.45 (H) 0.44 - 1.00 mg/dL   Calcium 9.3 8.9 - 10.3 mg/dL   Total Protein 5.0  (L) 6.5 - 8.1 g/dL   Albumin 3.1 (L) 3.5 - 5.0 g/dL   AST  20 15 - 41 U/L   ALT 16 14 - 54 U/L   Alkaline Phosphatase 26 (L) 38 - 126 U/L   Total Bilirubin 0.5 0.3 - 1.2 mg/dL   GFR calc non Af Amer 35 (L) >60 mL/min   GFR calc Af Amer 40 (L) >60 mL/min    Comment: (NOTE) The eGFR has been calculated using the CKD EPI equation. This calculation has not been validated in all clinical situations. eGFR's persistently <60 mL/min signify possible Chronic Kidney Disease.    Anion gap 10 5 - 15  TSH     Status: Abnormal   Collection Time: 02/05/16 12:17 PM  Result Value Ref Range   TSH 6.728 (H) 0.350 - 4.500 uIU/mL    Comment: Performed by a 3rd Generation assay with a functional sensitivity of <=0.01 uIU/mL.  T4, free     Status: None   Collection Time: 02/05/16 12:17 PM  Result Value Ref Range   Free T4 0.83 0.61 - 1.12 ng/dL    Comment: (NOTE) Biotin ingestion may interfere with free T4 tests. If the results are inconsistent with the TSH level, previous test results, or the clinical presentation, then consider biotin interference. If needed, order repeat testing after stopping biotin.   Basic metabolic panel     Status: Abnormal   Collection Time: 02/05/16 12:17 PM  Result Value Ref Range   Sodium 131 (L) 135 - 145 mmol/L   Potassium 3.6 3.5 - 5.1 mmol/L   Chloride 97 (L) 101 - 111 mmol/L   CO2 24 22 - 32 mmol/L   Glucose, Bld 111 (H) 65 - 99 mg/dL   BUN 17 6 - 20 mg/dL   Creatinine, Ser 1.31 (H) 0.44 - 1.00 mg/dL   Calcium 9.4 8.9 - 10.3 mg/dL   GFR calc non Af Amer 39 (L) >60 mL/min   GFR calc Af Amer 46 (L) >60 mL/min    Comment: (NOTE) The eGFR has been calculated using the CKD EPI equation. This calculation has not been validated in all clinical situations. eGFR's persistently <60 mL/min signify possible Chronic Kidney Disease.    Anion gap 10 5 - 15  Protime-INR     Status: Abnormal   Collection Time: 02/06/16  3:00 AM  Result Value Ref Range   Prothrombin Time  23.4 (H) 11.4 - 15.2 seconds   INR 2.05      Lipid Panel     Component Value Date/Time   CHOL 137 12/09/2015 1042   TRIG 258 (H) 12/09/2015 1042   HDL 24 (L) 12/09/2015 1042   CHOLHDL 5.7 (H) 12/09/2015 1042   VLDL 52 (H) 12/09/2015 1042   LDLCALC 61 12/09/2015 1042     Lab Results  Component Value Date   HGBA1C 5.6 12/09/2015   HGBA1C 5.6 09/19/2015   HGBA1C 5.8 (H) 03/14/2015        HPI :   Katie [name] is a 75 y.o. female with medical history significant of a fib, recent H. Flu B/L PNA.  She was recently hospitalized for PNA for 7 days in October.  Since then she has felt weak/fatigued more than normal.  More recently her legs began to swell and she took 2 dosed of metalzone.  Her swelling improved but when she went to her PCP, she was orthostatic.  She does not report dizziness but does feel unsteady on her feet.  She has fallen a few times as well.   She also has pain with deep inspiration on  both sides of her chest wall R>L. + frequency but no dysuria No SOB,-has some dietary indiscretions and drinks 4- 16oz bottles of water/day   HOSPITAL COURSE: *   Orthostatic hypotension-in the setting of acute kidney injury Likely secondary to overdiuresis in the setting of recent pneumonia and poor oral intake Received IV fluids, diuretics held, held Cozaar Started  patient on midodrine 5 mg 3 times a day Continue to hold diuretics/ARB PT OT evaluation-no PT follow-up recommended No suspicion for cardiac event or pulmonary event, troponins remain negative, patient not found to be hypoxic, low suspicion for PE   Hyperlipidemia-diet controlled  Chronic atrial fibrillation-currently rate controlled on digoxin, Cardizem, INR subtherapeutic, Coumadin dosing per pharmacy. Noted to have resting bradycardia at night. INR 2.05  Pleuritic chest pain-cardiac enzymes negative 2 Repeat chest x-ray shows no pneumonia   Acute on chronic kidney disease stage III-baseline  creatinine around 1.4- leading to dehydration and orthostatic hypotention. Patient presented with a creatinine of 1.94, acute kidney injury resolved, creatinine at baseline now 1.31. Urine culture showed multiple morphologies, no suspicion for UTI    Hypothyroidism, continue Synthroid. TSH 6.7, free T4 0.83  Discharge Exam: *  Blood pressure 109/71, pulse 86, temperature 97.6 F (36.4 C), temperature source Oral, resp. rate 18, height 5' 3.5" (1.613 m), weight 91.2 kg (201 lb 1.6 oz), SpO2 94 %. General exam: Appears calm and comfortable  Respiratory system: Clear to auscultation. Respiratory effort normal. Cardiovascular system: S1 & S2 heard, RRR. No JVD, murmurs, rubs, gallops or clicks. No pedal edema. Gastrointestinal system: Abdomen is nondistended, soft and nontender. No organomegaly or masses felt. Normal bowel sounds heard. Central nervous system: Alert and oriented. No focal neurological deficits. Extremities: Symmetric 5 x 5 power. Skin: No rashes, lesions or ulcers Psychiatry: Judgement and insight appear normal. Mood & affect appropriate.        Follow-up Information    MCKEOWN,WILLIAM DAVID, MD. Schedule an appointment as soon as possible for a visit.   Specialty:  Internal Medicine Why:  Hospital follow-up Contact information: [address] [place] [address] [phone]           Signed: Reyne Dumas 02/06/2016, 10:00 AM        Time spent >45 mins

## 2016-02-06 NOTE — Progress Notes (Signed)
Pt's HR down to 38 non sustaining, asymptomatic. K. Baltazar Najjar was notified. Will monitor accordingly.

## 2016-02-09 ENCOUNTER — Telehealth: Payer: Self-pay | Admitting: *Deleted

## 2016-02-09 NOTE — Telephone Encounter (Signed)
Patient was told to hold her fluid medication by the hospitalist. Per Dr Melford Aase, continue to hold the medication until 02/12/2016 as instructed. Patient is aware.

## 2016-02-10 ENCOUNTER — Ambulatory Visit: Payer: Self-pay | Admitting: Physician Assistant

## 2016-02-10 ENCOUNTER — Encounter: Payer: Self-pay | Admitting: Physician Assistant

## 2016-02-10 ENCOUNTER — Ambulatory Visit (INDEPENDENT_AMBULATORY_CARE_PROVIDER_SITE_OTHER): Payer: Medicare Other | Admitting: Physician Assistant

## 2016-02-10 VITALS — BP 118/68 | HR 93 | Resp 18 | Ht 63.0 in | Wt 203.0 lb

## 2016-02-10 DIAGNOSIS — N179 Acute kidney failure, unspecified: Secondary | ICD-10-CM | POA: Diagnosis not present

## 2016-02-10 DIAGNOSIS — R0600 Dyspnea, unspecified: Secondary | ICD-10-CM | POA: Diagnosis not present

## 2016-02-10 DIAGNOSIS — I5031 Acute diastolic (congestive) heart failure: Secondary | ICD-10-CM

## 2016-02-10 DIAGNOSIS — I950 Idiopathic hypotension: Secondary | ICD-10-CM | POA: Diagnosis not present

## 2016-02-10 DIAGNOSIS — I482 Chronic atrial fibrillation, unspecified: Secondary | ICD-10-CM

## 2016-02-10 LAB — CBC WITH DIFFERENTIAL/PLATELET
Basophils Absolute: 0 cells/uL (ref 0–200)
Basophils Relative: 0 %
Eosinophils Absolute: 93 cells/uL (ref 15–500)
Eosinophils Relative: 1 %
HEMATOCRIT: 43.1 % (ref 35.0–45.0)
Hemoglobin: 14.4 g/dL (ref 11.7–15.5)
LYMPHS PCT: 29 %
Lymphs Abs: 2697 cells/uL (ref 850–3900)
MCH: 31 pg (ref 27.0–33.0)
MCHC: 33.4 g/dL (ref 32.0–36.0)
MCV: 92.9 fL (ref 80.0–100.0)
MONO ABS: 930 {cells}/uL (ref 200–950)
MONOS PCT: 10 %
MPV: 9.9 fL (ref 7.5–12.5)
NEUTROS PCT: 60 %
Neutro Abs: 5580 cells/uL (ref 1500–7800)
PLATELETS: 276 10*3/uL (ref 140–400)
RBC: 4.64 MIL/uL (ref 3.80–5.10)
RDW: 15.9 % — AB (ref 11.0–15.0)
WBC: 9.3 10*3/uL (ref 3.8–10.8)

## 2016-02-10 MED ORDER — AZITHROMYCIN 250 MG PO TABS: ORAL_TABLET | ORAL | 1 refills | Status: AC

## 2016-02-10 MED ORDER — IPRATROPIUM-ALBUTEROL 0.5-2.5 (3) MG/3ML IN SOLN: 3.0000 mL | Freq: Once | RESPIRATORY_TRACT | Status: DC

## 2016-02-10 MED ORDER — PREDNISONE 20 MG PO TABS: ORAL_TABLET | ORAL | 0 refills | Status: DC

## 2016-02-10 NOTE — Patient Instructions (Addendum)
will decrease midodrine to once daily  add back on lasix Take weight and BP daily Follow up Friday  symbicort samples use twice daily, wash mouth afterwards  prednisone take it do douneb at home get CXR  Go to ER if any dizziness, chest pain, shortness of breath.

## 2016-02-10 NOTE — Progress Notes (Signed)
Assessment and Plan: 27741 hospital visit follow up for hypotension and AKI Currently with wheezing/SOB, weight is up 5 lbs, ? COPD versus CHF- will decrease midodrine to once daily, add back on lasix, symbicort samples given, predniosone, do douneb at home, get CXR, get labs CBC, BMP, liver, and BNP. May need AM cortisol level once off prednisone Will do close follow up Friday Go to Er if any SOB, CP.   Over 40 minutes of exam, counseling, chart review, and complex, high/moderate level critical decision making was performed this visit.   HPI 75 y.o.female presents for follow up from the hospital. Admit date to the hospital was 02/03/16, patient was discharged from the hospital on 02/06/16 and our office contacted the office the day after discharge to set up a follow up appointment, patient was admitted for:  Hypotension, AKI, Afib. She presented to the office with severe hypotension, sent to the ER, found to have AKI/hypotension, her lasix, cozaar, and zaroxyln was held and she was started on midodrine.   She has been having swelling in her face, AB, and mildly in her legs. She has had wheezing x yesterday, she always has orthopnea, denies PND, nocturia x 1. Her BP has been improved, checking it daily 138/68, HR 70-80's. Weight at home has been unchanged. Some thick white mucus, having chills, no fever, no sinus drainage. Took breathing treatment at home this AM.   Wt Readings from Last 10 Encounters:  02/10/16 203 lb (92.1 kg)  02/06/16 201 lb 1.6 oz (91.2 kg)  02/03/16 198 lb 9.6 oz (90.1 kg)  01/23/16 199 lb 9.6 oz (90.5 kg)  01/12/16 203 lb (92.1 kg)  01/07/16 198 lb (89.8 kg)  01/02/16 197 lb 12.8 oz (89.7 kg)  12/30/15 201 lb 14.4 oz (91.6 kg)  12/09/15 202 lb 9.6 oz (91.9 kg)  11/14/15 202 lb (91.6 kg)    Lab Results  Component Value Date   CREATININE 1.31 (H) 02/05/2016   BUN 17 02/05/2016   NA 131 (L) 02/05/2016   K 3.6 02/05/2016   CL 97 (L) 02/05/2016   CO2 24 02/05/2016    Lab Results  Component Value Date   WBC 6.0 02/05/2016   HGB 13.3 02/05/2016   HCT 38.9 02/05/2016   MCV 92.2 02/05/2016   PLT 241 02/05/2016   Lab Results  Component Value Date   INR 2.05 02/06/2016   INR 1.63 02/05/2016   INR 1.67 02/04/2016   Blood pressure 118/68, pulse 93, resp. rate 18, height 5\' 3"  (1.6 m), weight 203 lb (92.1 kg), SpO2 97 %.   Images while in the hospital: Dg Chest 2 View  Result Date: 02/03/2016 CLINICAL DATA:  Pneumonia.  Shortness of breath and dyspnea EXAM: CHEST  2 VIEW COMPARISON:  01/07/2016 FINDINGS: Mild cardiac enlargement. No pleural effusion or edema. The lungs are hyperinflated. There are coarsened interstitial markings noted bilaterally. Interval clearing of bilateral lower lobe opacities. No new findings. IMPRESSION: 1. Interval clearing of bilateral lower lobe pneumonias. Electronically Signed   By: Kerby Moors M.D.   On: 02/03/2016 15:52    Past Medical History:  Diagnosis Date  . Aortic stenosis    mild AS witm mild to mod AR by 11/2012 echo  . Arthritis   . Atrial fibrillation ([place])   . Atrial flutter ([place])   . Bronchitis, chronic ([place])   . Cervical dysplasia   . Coronary artery disease   . Cystocele    either on bladder or kidneys patient is  unsure but stated physician will watch it  . Dizziness    If patient gets out of bed too fast  . Family history of anesthesia complication    Hard for daughter to wake up after Anesthesia  . Gout   . H/O hiatal hernia   . History of Bell's palsy   . Migraine headache   . Osteopenia   . Rosacea conjunctivitis    Right eye is worse than left eye  . Seasonal allergies   . Shortness of breath    with exertion  . Thyroid disease    Hypo  . Tobacco abuse   . Unspecified vitamin D deficiency      Allergies  Allergen Reactions  . Amiodarone Other (See Comments)    PULMONARY TOXICITY  . Diovan [Valsartan] Other (See Comments)    HYPOTENSION  . Doxycycline Diarrhea and Other  (See Comments)    VISUAL DISTURBANCE  . Flexeril [Cyclobenzaprine] Other (See Comments)    FATIGUE  . Keflex [Cephalexin] Diarrhea  . Verapamil Other (See Comments)    EDEMA  . Advair Diskus [Fluticasone-Salmeterol] Other (See Comments)    SKIN CHANGES.  Can tolerate low dose of prednisone without complications  . Codeine Other (See Comments)    unknown      Current Outpatient Prescriptions on File Prior to Visit  Medication Sig Dispense Refill  . acetaminophen (TYLENOL) 500 MG tablet Take 1,000 mg by mouth 2 (two) times daily as needed for moderate pain.    Marland Kitchen allopurinol (ZYLOPRIM) 300 MG tablet TAKE 1 TABLET BY MOUTH EVERY DAY 90 tablet 1  . aspirin 81 MG tablet Take 81 mg by mouth daily.      Marland Kitchen azelastine (ASTELIN) 0.1 % nasal spray Place 2 sprays into both nostrils 2 (two) times daily. Use in each nostril as directed (Patient taking differently: Place 2 sprays into both nostrils at bedtime. Use in each nostril as directed) 30 mL 2  . digoxin (LANOXIN) 0.25 MG tablet TAKE 1 TABLET BY MOUTH DAILY 90 tablet 1  . diltiazem (CARDIZEM) 30 MG tablet Take 1 tablet (30 mg total) by mouth 2 (two) times daily. 60 tablet 1  . erythromycin ophthalmic ointment Place 1 application into both eyes at bedtime.    Derrill Memo ON 02/12/2016] furosemide (LASIX) 40 MG tablet Take 1 tablet (40 mg total) by mouth daily. 30 tablet 0  . gabapentin (NEURONTIN) 300 MG capsule TAKE ONE CAPSULE BY MOUTH 3 TIMES A DAY 270 capsule 1  . hydroxypropyl methylcellulose / hypromellose (ISOPTO TEARS / GONIOVISC) 2.5 % ophthalmic solution Place 1 drop into both eyes 3 (three) times daily.    Marland Kitchen ipratropium (ATROVENT) 0.02 % nebulizer solution Take 2.5 mLs (0.5 mg total) by nebulization every 6 (six) hours as needed for wheezing or shortness of breath. 500 mL 1  . levalbuterol (XOPENEX) 1.25 MG/0.5ML nebulizer solution Take 1.25 mg by nebulization every 6 (six) hours as needed for wheezing or shortness of breath. 1 each 3  .  levothyroxine (SYNTHROID, LEVOTHROID) 50 MCG tablet Take 50 mcg by mouth daily.    . meclizine (ANTIVERT) 25 MG tablet Take 1 tablet (25 mg total) by mouth 3 (three) times daily as needed for dizziness. 60 tablet 1  . midodrine (PROAMATINE) 5 MG tablet Take 1 tablet (5 mg total) by mouth 3 (three) times daily with meals. 90 tablet 0  . montelukast (SINGULAIR) 10 MG tablet TAKE 1 TABLET EVERY DAY FOR ALLERGIES 90 tablet 1  . potassium  chloride SA (K-DUR,KLOR-CON) 20 MEQ tablet Take 2 tablets (40 mEq total) by mouth daily. (Patient taking differently: Take 20 mEq by mouth daily as needed (takes with metolazone only). ) 30 tablet 0  . pravastatin (PRAVACHOL) 40 MG tablet TAKE 1 TABLET BY MOUTH AT BEDTIME FOR CHOLESTEROL 90 tablet 1  . prochlorperazine (COMPAZINE) 5 MG tablet TAKE 1 TABLET BY MOUTH 3 TIMES A DAY FOR VERTIGO OR NAUSEA 90 tablet 1  . warfarin (COUMADIN) 2 MG tablet TAKE 1 TO 2 TABLETS BY MOUTH DAILY OR AS DIRECTED (Patient taking differently: takes 2mg  on tues, thurs and sat, takes 4mg  all other days) 180 tablet 3  . albuterol (PROVENTIL HFA;VENTOLIN HFA) 108 (90 BASE) MCG/ACT inhaler 2 puffs 5 minutes apart every 4 to 6 hours as needed to rescue asthma (Patient taking differently: Inhale 2 puffs into the lungs every 4 (four) hours as needed for shortness of breath. Inhale 2 puffs into the lungs 5 minutes apart every 4 to 6 hours as needed to rescue asthma) 1 Inhaler 11   No current facility-administered medications on file prior to visit.     ROS: all negative except above.   Physical Exam: Filed Weights   02/10/16 1413  Weight: 203 lb (92.1 kg)   BP 118/68   Pulse 93   Resp 18   Ht 5\' 3"  (1.6 m)   Wt 203 lb (92.1 kg)   SpO2 97%   BMI 35.96 kg/m  General Appearance: Well developed well nourished, non-toxic appearing in no apparent distress. Eyes: PERRLA, EOMs, conjunctiva w/ no swelling or erythema or discharge Sinuses: No Frontal/maxillary tenderness ENT/Mouth: Ear canals  clear without swelling or erythema.  TM's normal bilaterally with no retractions, bulging, or loss of landmarks.   Neck: Supple, thyroid normal, no notable JVD  Respiratory: Respiratory effort normal, decreased breath sounds with diffuse wheezing and mild bilateral rhonchi. No retractions or accessory muscle usage.  Cardio: RRR with no 3/6 blowing murmur no RGs.   Abdomen: Soft, + BS.  Non tender, no guarding, rebound, hernias, masses.  Musculoskeletal: Full ROM, 5/5 strength, normal gait.  Skin: Warm, dry without rashes  Neuro: Awake and oriented X 3, Cranial nerves intact. Normal muscle tone, no cerebellar symptoms. Sensation intact.  Psych: normal affect, Insight and Judgment appropriate.   Vicie Mutters, PA-C 3:47 PM [facility]

## 2016-02-11 LAB — BASIC METABOLIC PANEL WITH GFR
BUN: 16 mg/dL (ref 7–25)
CALCIUM: 9.8 mg/dL (ref 8.6–10.4)
CO2: 23 mmol/L (ref 20–31)
Chloride: 104 mmol/L (ref 98–110)
Creat: 1.17 mg/dL — ABNORMAL HIGH (ref 0.60–0.93)
GFR, EST AFRICAN AMERICAN: 53 mL/min — AB (ref >=60)
GFR, EST NON AFRICAN AMERICAN: 46 mL/min — AB (ref >=60)
GLUCOSE: 73 mg/dL (ref 65–99)
Potassium: 4.4 mmol/L (ref 3.5–5.3)
Sodium: 139 mmol/L (ref 135–146)

## 2016-02-11 LAB — HEPATIC FUNCTION PANEL
ALBUMIN: 3.9 g/dL (ref 3.6–5.1)
ALT: 13 U/L (ref 6–29)
AST: 16 U/L (ref 10–35)
Alkaline Phosphatase: 28 U/L — ABNORMAL LOW (ref 33–130)
BILIRUBIN INDIRECT: 0.6 mg/dL (ref 0.2–1.2)
BILIRUBIN TOTAL: 0.8 mg/dL (ref 0.2–1.2)
Bilirubin, Direct: 0.2 mg/dL (ref <=0.2)
TOTAL PROTEIN: 6.2 g/dL (ref 6.1–8.1)

## 2016-02-11 LAB — TSH: TSH: 6.55 mIU/L — ABNORMAL HIGH

## 2016-02-11 LAB — PROTIME-INR
INR: 2.6 — ABNORMAL HIGH
PROTHROMBIN TIME: 26.1 s — AB (ref 9.0–11.5)

## 2016-02-11 LAB — BRAIN NATRIURETIC PEPTIDE: Brain Natriuretic Peptide: 195.1 pg/mL — ABNORMAL HIGH (ref <100)

## 2016-02-11 LAB — MAGNESIUM: MAGNESIUM: 2.2 mg/dL (ref 1.5–2.5)

## 2016-02-13 ENCOUNTER — Ambulatory Visit (HOSPITAL_COMMUNITY)
Admission: RE | Admit: 2016-02-13 | Discharge: 2016-02-13 | Disposition: A | Discharge status: Home / Self Care | Payer: Medicare Other | Source: Ambulatory Visit | Attending: Physician Assistant | Admitting: Physician Assistant

## 2016-02-13 ENCOUNTER — Ambulatory Visit (INDEPENDENT_AMBULATORY_CARE_PROVIDER_SITE_OTHER): Payer: Medicare Other | Admitting: Physician Assistant

## 2016-02-13 ENCOUNTER — Encounter: Payer: Self-pay | Admitting: Physician Assistant

## 2016-02-13 VITALS — BP 122/90 | HR 90 | Temp 97.2°F | Resp 16 | Ht 63.0 in | Wt 202.0 lb

## 2016-02-13 DIAGNOSIS — J441 Chronic obstructive pulmonary disease with (acute) exacerbation: Secondary | ICD-10-CM

## 2016-02-13 DIAGNOSIS — R05 Cough: Secondary | ICD-10-CM | POA: Insufficient documentation

## 2016-02-13 DIAGNOSIS — I950 Idiopathic hypotension: Secondary | ICD-10-CM

## 2016-02-13 DIAGNOSIS — N179 Acute kidney failure, unspecified: Secondary | ICD-10-CM | POA: Diagnosis not present

## 2016-02-13 DIAGNOSIS — I5031 Acute diastolic (congestive) heart failure: Secondary | ICD-10-CM | POA: Diagnosis not present

## 2016-02-13 DIAGNOSIS — R0600 Dyspnea, unspecified: Secondary | ICD-10-CM

## 2016-02-13 DIAGNOSIS — I517 Cardiomegaly: Secondary | ICD-10-CM | POA: Diagnosis not present

## 2016-02-13 NOTE — Patient Instructions (Signed)
I think it is possible that you have sleep apnea. It can cause interrupted sleep, headaches, frequent awakenings, fatigue, dry mouth, fast/slow heart beats, memory issues, anxiety/depression, swelling, numbness tingling hands/feet, weight gain, shortness of breath, and the list goes on. Sleep apnea needs to be ruled out because if it is left untreated it does eventually lead to abnormal heart beats, lung failure or heart failure as well as increasing the risk of heart attack and stroke. There are masks you can wear OR a mouth piece that I can give you information about. Often times though people feel MUCH better after getting treatment.   Sleep Apnea  Sleep apnea is a sleep disorder characterized by abnormal pauses in breathing while you sleep. When your breathing pauses, the level of oxygen in your blood decreases. This causes you to move out of deep sleep and into light sleep. As a result, your quality of sleep is poor, and the system that carries your blood throughout your body (cardiovascular system) experiences stress. If sleep apnea remains untreated, the following conditions can develop:  High blood pressure (hypertension).  Coronary artery disease.  Inability to achieve or maintain an erection (impotence).  Impairment of your thought process (cognitive dysfunction). There are three types of sleep apnea: 1. Obstructive sleep apnea--Pauses in breathing during sleep because of a blocked airway. 2. Central sleep apnea--Pauses in breathing during sleep because the area of the brain that controls your breathing does not send the correct signals to the muscles that control breathing. 3. Mixed sleep apnea--A combination of both obstructive and central sleep apnea.  RISK FACTORS The following risk factors can increase your risk of developing sleep apnea:  Being overweight.  Smoking.  Having narrow passages in your nose and throat.  Being of older age.  Being female.  Alcohol use.   Sedative and tranquilizer use.  Ethnicity. Among individuals younger than 35 years, African Americans are at increased risk of sleep apnea. SYMPTOMS   Difficulty staying asleep.  Daytime sleepiness and fatigue.  Loss of energy.  Irritability.  Loud, heavy snoring.  Morning headaches.  Trouble concentrating.  Forgetfulness.  Decreased interest in sex. DIAGNOSIS  In order to diagnose sleep apnea, your caregiver will perform a physical examination. Your caregiver may suggest that you take a home sleep test. Your caregiver may also recommend that you spend the night in a sleep lab. In the sleep lab, several monitors record information about your heart, lungs, and brain while you sleep. Your leg and arm movements and blood oxygen level are also recorded. TREATMENT The following actions may help to resolve mild sleep apnea:  Sleeping on your side.   Using a decongestant if you have nasal congestion.   Avoiding the use of depressants, including alcohol, sedatives, and narcotics.   Losing weight and modifying your diet if you are overweight. There also are devices and treatments to help open your airway:  Oral appliances. These are custom-made mouthpieces that shift your lower jaw forward and slightly open your bite. This opens your airway.  Devices that create positive airway pressure. This positive pressure "splints" your airway open to help you breathe better during sleep. The following devices create positive airway pressure:  Continuous positive airway pressure (CPAP) device. The CPAP device creates a continuous level of air pressure with an air pump. The air is delivered to your airway through a mask while you sleep. This continuous pressure keeps your airway open.  Nasal expiratory positive airway pressure (EPAP) device. The EPAP device   creates positive air pressure as you exhale. The device consists of single-use valves, which are inserted into each nostril and held in  place by adhesive. The valves create very little resistance when you inhale but create much more resistance when you exhale. That increased resistance creates the positive airway pressure. This positive pressure while you exhale keeps your airway open, making it easier to breath when you inhale again.  Bilevel positive airway pressure (BPAP) device. The BPAP device is used mainly in patients with central sleep apnea. This device is similar to the CPAP device because it also uses an air pump to deliver continuous air pressure through a mask. However, with the BPAP machine, the pressure is set at two different levels. The pressure when you exhale is lower than the pressure when you inhale.  Surgery. Typically, surgery is only done if you cannot comply with less invasive treatments or if the less invasive treatments do not improve your condition. Surgery involves removing excess tissue in your airway to create a wider passage way. Document Released: 02/19/2002 Document Revised: 06/26/2012 Document Reviewed: 07/08/2011 ExitCare Patient Information 2015 [employer]. This information is not intended to replace advice given to you by your health care provider. Make sure you discuss any questions you have with your health care provider.     

## 2016-02-13 NOTE — Progress Notes (Signed)
Assessment and Plan: Dsypnea- will go get CXR, continue medications as they are for now, is improving Has Ov with Dr. Melvyn Novas Tuesday-? Need for sleep study  Future Appointments Date Time Provider [place]  02/16/2016 11:00 AM [facility] PFT ROOM [facility] None  02/16/2016 12:00 PM Tanda Rockers, MD [facility] None  03/01/2016 9:45 AM [name], MD [facility] None  04/20/2016 8:30 AM [facility] VASC 5 [facility]  04/20/2016 9:30 AM [facility] VASC 5 [facility]  04/20/2016 9:45 AM Sharmon Leyden Nickel, NP [facility]  01/06/2017 9:00 AM [name], MD [facility] None    HPI 75 y.o.female presents for follow up from [place] on 11/28. She was recently discharged from the hospital on 02/06/2016 with AKI/hypotension, she was taken off her lasix and other BP meds and came in with 5 lbs weight gain and wheezing, she was given zpak, prednisone and started back on her lasix. Weight is down 1 lbs, she states she is breathing better, + orthopnea, no PND, mild edema in her legs but states has improved.  States that she can fall asleep while sitting down, states both daughters have CPAP's, she does not sleep well at night, has afib.   Wt Readings from Last 3 Encounters:  02/13/16 202 lb (91.6 kg)  02/10/16 203 lb (92.1 kg)  02/06/16 201 lb 1.6 oz (91.2 kg)   Blood pressure 122/90, pulse 90, temperature 97.2 F (36.2 C), resp. rate 16, height 5\' 3"  (1.6 m), weight 202 lb (91.6 kg), SpO2 95 %.  Lab Results  Component Value Date   CREATININE 1.17 (H) 02/10/2016   BUN 16 02/10/2016   NA 139 02/10/2016   K 4.4 02/10/2016   CL 104 02/10/2016   CO2 23 02/10/2016    Past Medical History:  Diagnosis Date  . Aortic stenosis    mild AS witm mild to mod AR by 11/2012 echo  . Arthritis   . Atrial fibrillation ([place])   . Atrial flutter ([place])   . Bronchitis, chronic ([place])   . Cervical dysplasia   . Coronary artery disease   . Cystocele    either on bladder or kidneys patient is unsure but  stated physician will watch it  . Dizziness    If patient gets out of bed too fast  . Family history of anesthesia complication    Hard for daughter to wake up after Anesthesia  . Gout   . H/O hiatal hernia   . History of Bell's palsy   . Migraine headache   . Osteopenia   . Rosacea conjunctivitis    Right eye is worse than left eye  . Seasonal allergies   . Shortness of breath    with exertion  . Thyroid disease    Hypo  . Tobacco abuse   . Unspecified vitamin D deficiency      Allergies  Allergen Reactions  . Amiodarone Other (See Comments)    PULMONARY TOXICITY  . Diovan [Valsartan] Other (See Comments)    HYPOTENSION  . Doxycycline Diarrhea and Other (See Comments)    VISUAL DISTURBANCE  . Flexeril [Cyclobenzaprine] Other (See Comments)    FATIGUE  . Keflex [Cephalexin] Diarrhea  . Verapamil Other (See Comments)    EDEMA  . Advair Diskus [Fluticasone-Salmeterol] Other (See Comments)    SKIN CHANGES.  Can tolerate low dose of prednisone without complications  . Codeine Other (See Comments)    unknown      Current Outpatient Prescriptions on File Prior to  Visit  Medication Sig Dispense Refill  . acetaminophen (TYLENOL) 500 MG tablet Take 1,000 mg by mouth 2 (two) times daily as needed for moderate pain.    Marland Kitchen allopurinol (ZYLOPRIM) 300 MG tablet TAKE 1 TABLET BY MOUTH EVERY DAY 90 tablet 1  . aspirin 81 MG tablet Take 81 mg by mouth daily.      Marland Kitchen azelastine (ASTELIN) 0.1 % nasal spray Place 2 sprays into both nostrils 2 (two) times daily. Use in each nostril as directed (Patient taking differently: Place 2 sprays into both nostrils at bedtime. Use in each nostril as directed) 30 mL 2  . azithromycin (ZITHROMAX) 250 MG tablet Take 2 tablets (500 mg) on  Day 1,  followed by 1 tablet (250 mg) once daily on Days 2 through 5. 6 each 1  . digoxin (LANOXIN) 0.25 MG tablet TAKE 1 TABLET BY MOUTH DAILY 90 tablet 1  . diltiazem (CARDIZEM) 30 MG tablet Take 1 tablet (30 mg  total) by mouth 2 (two) times daily. 60 tablet 1  . erythromycin ophthalmic ointment Place 1 application into both eyes at bedtime.    . furosemide (LASIX) 40 MG tablet Take 1 tablet (40 mg total) by mouth daily. 30 tablet 0  . gabapentin (NEURONTIN) 300 MG capsule TAKE ONE CAPSULE BY MOUTH 3 TIMES A DAY 270 capsule 1  . hydroxypropyl methylcellulose / hypromellose (ISOPTO TEARS / GONIOVISC) 2.5 % ophthalmic solution Place 1 drop into both eyes 3 (three) times daily.    Marland Kitchen ipratropium (ATROVENT) 0.02 % nebulizer solution Take 2.5 mLs (0.5 mg total) by nebulization every 6 (six) hours as needed for wheezing or shortness of breath. 500 mL 1  . levalbuterol (XOPENEX) 1.25 MG/0.5ML nebulizer solution Take 1.25 mg by nebulization every 6 (six) hours as needed for wheezing or shortness of breath. 1 each 3  . levothyroxine (SYNTHROID, LEVOTHROID) 50 MCG tablet Take 50 mcg by mouth daily.    . meclizine (ANTIVERT) 25 MG tablet Take 1 tablet (25 mg total) by mouth 3 (three) times daily as needed for dizziness. 60 tablet 1  . midodrine (PROAMATINE) 5 MG tablet Take 1 tablet (5 mg total) by mouth 3 (three) times daily with meals. 90 tablet 0  . montelukast (SINGULAIR) 10 MG tablet TAKE 1 TABLET EVERY DAY FOR ALLERGIES 90 tablet 1  . potassium chloride SA (K-DUR,KLOR-CON) 20 MEQ tablet Take 2 tablets (40 mEq total) by mouth daily. (Patient taking differently: Take 20 mEq by mouth daily as needed (takes with metolazone only). ) 30 tablet 0  . pravastatin (PRAVACHOL) 40 MG tablet TAKE 1 TABLET BY MOUTH AT BEDTIME FOR CHOLESTEROL 90 tablet 1  . predniSONE (DELTASONE) 20 MG tablet 2 tablets daily for 3 days, 1 tablet daily for 4 days. 10 tablet 0  . prochlorperazine (COMPAZINE) 5 MG tablet TAKE 1 TABLET BY MOUTH 3 TIMES A DAY FOR VERTIGO OR NAUSEA 90 tablet 1  . warfarin (COUMADIN) 2 MG tablet TAKE 1 TO 2 TABLETS BY MOUTH DAILY OR AS DIRECTED (Patient taking differently: takes 2mg  on tues, thurs and sat, takes 4mg  all  other days) 180 tablet 3  . albuterol (PROVENTIL HFA;VENTOLIN HFA) 108 (90 BASE) MCG/ACT inhaler 2 puffs 5 minutes apart every 4 to 6 hours as needed to rescue asthma (Patient taking differently: Inhale 2 puffs into the lungs every 4 (four) hours as needed for shortness of breath. Inhale 2 puffs into the lungs 5 minutes apart every 4 to 6 hours as needed to rescue  asthma) 1 Inhaler 11   Current Facility-Administered Medications on File Prior to Visit  Medication Dose Route Frequency Provider Last Rate Last Dose  . ipratropium-albuterol (DUONEB) 0.5-2.5 (3) MG/3ML nebulizer solution 3 mL  3 mL Nebulization Once Vicie Mutters, PA-C        ROS: all negative except above.   Physical Exam: Filed Weights   02/13/16 1119  Weight: 202 lb (91.6 kg)   BP 122/90   Pulse 90   Temp 97.2 F (36.2 C)   Resp 16   Ht 5\' 3"  (1.6 m)   Wt 202 lb (91.6 kg)   SpO2 95%   BMI 35.78 kg/m  General Appearance: Well developed well nourished, non-toxic appearing in no apparent distress. Eyes: PERRLA, EOMs, conjunctiva w/ no swelling or erythema or discharge Sinuses: No Frontal/maxillary tenderness ENT/Mouth: Ear canals clear without swelling or erythema.  TM's normal bilaterally with no retractions, bulging, or loss of landmarks.   Neck: Supple, thyroid normal, no notable JVD  Respiratory: Respiratory effort normal, decreased breath sounds bilateral bases, with wheezing right lower lung, No retractions or accessory muscle usage.  Cardio: RRR with no 3/6 blowing murmur no RGs.   Abdomen: Soft, + BS.  Non tender, no guarding, rebound, hernias, masses.  Musculoskeletal: Full ROM, 5/5 strength, normal gait.  Skin: Warm, dry without rashes  Neuro: Awake and oriented X 3, Cranial nerves intact. Normal muscle tone, no cerebellar symptoms. Sensation intact.  Psych: normal affect, Insight and Judgment appropriate.     Vicie Mutters, PA-C 12:01 PM [facility]

## 2016-02-14 ENCOUNTER — Encounter: Payer: Self-pay | Admitting: *Deleted

## 2016-02-15 ENCOUNTER — Other Ambulatory Visit: Payer: Self-pay | Admitting: Internal Medicine

## 2016-02-16 ENCOUNTER — Encounter: Payer: Self-pay | Admitting: Internal Medicine

## 2016-02-16 ENCOUNTER — Ambulatory Visit (INDEPENDENT_AMBULATORY_CARE_PROVIDER_SITE_OTHER): Payer: Medicare Other | Admitting: Internal Medicine

## 2016-02-16 VITALS — BP 136/82 | HR 68 | Ht 64.0 in | Wt 200.0 lb

## 2016-02-16 DIAGNOSIS — J14 Pneumonia due to Hemophilus influenzae: Secondary | ICD-10-CM

## 2016-02-16 DIAGNOSIS — R0602 Shortness of breath: Secondary | ICD-10-CM | POA: Diagnosis not present

## 2016-02-16 LAB — PULMONARY FUNCTION TEST
DL/VA % pred: 69 %
DL/VA: 3.35 ml/min/mmHg/L
DLCO UNC: 12.64 ml/min/mmHg
DLCO cor % pred: 49 %
DLCO cor: 11.99 ml/min/mmHg
DLCO unc % pred: 52 %
FEF 25-75 Post: 1.06 L/sec
FEF 25-75 Pre: 1.02 L/sec
FEF2575-%Change-Post: 3 %
FEF2575-%PRED-POST: 61 %
FEF2575-%Pred-Pre: 59 %
FEV1-%CHANGE-POST: 0 %
FEV1-%PRED-POST: 70 %
FEV1-%Pred-Pre: 70 %
FEV1-PRE: 1.51 L
FEV1-Post: 1.52 L
FEV1FVC-%Change-Post: 0 %
FEV1FVC-%Pred-Pre: 97 %
FEV6-%Change-Post: 0 %
FEV6-%PRED-POST: 75 %
FEV6-%PRED-PRE: 76 %
FEV6-POST: 2.05 L
FEV6-PRE: 2.06 L
FEV6FVC-%CHANGE-POST: 0 %
FEV6FVC-%PRED-POST: 104 %
FEV6FVC-%PRED-PRE: 105 %
FVC-%CHANGE-POST: 0 %
FVC-%Pred-Post: 72 %
FVC-%Pred-Pre: 72 %
FVC-Post: 2.05 L
FVC-Pre: 2.06 L
POST FEV1/FVC RATIO: 74 %
POST FEV6/FVC RATIO: 100 %
PRE FEV6/FVC RATIO: 100 %
Pre FEV1/FVC ratio: 73 %
RV % PRED: 71 %
RV: 1.62 L
TLC % pred: 76 %
TLC: 3.86 L

## 2016-02-16 NOTE — Assessment & Plan Note (Signed)
Echo 12/24/15 - Left ventricle: The cavity size was normal. There was mild focal   basal hypertrophy of the septum. Systolic function was normal.   The estimated ejection fraction was in the range of 60% to 65%. - Ventricular septum: Septal motion showed abnormal function,   dyssynergy, and &quot;bounce&quot;. - Aortic valve: Transvalvular velocity was minimally increased.   There was very mild stenosis. There was mild regurgitation. Peak   velocity (S): 218 cm/s. Mean gradient (S): 10 mm Hg. Valve area   (VTI): 1.56 cm^2. Valve area (Vmax): 1.49 cm^2. Valve area   (Vmean): 1.5 cm^2. - Mitral valve: Calcified annulus./ nl LA  Size  - Pulmonary arteries: Systolic pressure was moderately increased.   PA peak pressure: 45 mm Hg (S). VQ 12/23/15  Very low prob PE  - PFT's  02/16/2016  FEV1 1.52 (70 % ) ratio 74   p no % improvement from saba p nothing prior to study with DLCO   52/49  % corrects to 69 % for alv volume   ERV 54    I had an extended final summary discussion with the patient reviewing all relevant studies completed to date and  lasting 15 to 20 minutes of a 25 minute visit on the following issues:    She has very mild restrictive changes with slt disproportionate reduction in ERV c/w obesity but no copd at all or need for pulmonary f/u at this point   Pulmonary f/u can be prn

## 2016-02-16 NOTE — Patient Instructions (Addendum)
Ok just to use the symbicort up to 2 puffs every 12 hours if needed  You do not have significant copd and very unlikely you ever will

## 2016-02-16 NOTE — Progress Notes (Signed)
Subjective:     Patient ID: Katie [name], female   DOB: [birth year],    MRN: [id number]    Brief patient profile:  89 yowf quit smoking  2004 with variable sob but on best days can do housework and even some low level ex then admitted    Admit date: 12/23/2015 Discharge date: 12/30/2015  Discharge Diagnoses:  Active Problems:   Atrial fibrillation ([place])  Brief Narrative: 75 y.o.female,with past medical history of COPD, hypertension, atrial fibrillation on Warfarin,nonischemic cardiomyopathy, nonobstructive coronary artery disease patient presents with complaints of palpitation and shortness of breath this a.m.reports Has been having progressive dyspnea over few month, but then significantly worsened over last week.  Assessment & Plan:  Permanent A. fib (CHADSVASC 5) with rapid ventricular response - currently on cardizem for rate control and it seems to be working well as HR has been stable and at target range  - also on digoxin and coumadin     Acute hypoxic respiratory failure secondary to likely COPD exacerbation and RLL PNA, H. Influenzae - pt placed on Levaquin IV, transitioned to oral Levaquin  - sputum cultures with H. Influenzae and rx FQ s  - treat COPD with bronchodilators and steroids - has been successfully tapered off oxygen  - due to hemoptysis Ct chest requested and notable for Interstitial lung disease with bilateral peripheral ground-glass opacities in an upper lobe predominant distribution, with some radiographic improvement from remote prior CT. - also on Ct chest, patchy and ground-glass opacities throughout the right lower lobe, asymmetric involvement of interstitial lung disease versus superimposed acute abnormality such as infection - pt overall better, air movement noted bilaterally but still with coughing spells   Sepsis secondary to RLL PNA, hemophilus influenzae  - pt did meet criteria for sepsis on admission with WBC 15K, RR >20 and HR >90  bpm - will continue Levaquin to complete therapy upon discharge, extended coverage for several more days post discharge as pt still with significant coughing spells   Acute kidney injury - lasix was given IV so suspect this has contributed - encouraged oral intake and Cr is now trending down   Acute on Chronic combined systolic and diastolic congestive heart failure - weight 202 lbs on admission --> 199 --> 198 --> 205 --> 201 lbs this AM - transitioned to oral Lasix 10/12, cardiology has singed off   Obesity  - Body mass index is 35.81 kg/m    History of Present Illness  01/07/2016 1st [facility] office visit/ [place] Marcucci   Chief Complaint  Patient presents with  . Pulmonary Consult     Recent hospital admission for PNA and Influenza. She states her breathing is not quite back to baseline and she has minimal cough.   pain R chest posteriorly  75% better / still some pain on deep insp/ fatigue > sob rec No change rx   02/16/2016  f/u ov/Gustavo Meditz re:  S/p CAP  / pfts min restriction  Chief Complaint  Patient presents with  . Follow-up    PFT's done today. Breathing has improved. She c/o sore throat for the past 2 days.     Really Not limited by breathing from desired activities / min discomfort R post/lat chest with deep insp/ not supine/ not enough to want to take anything for it  No obvious day to day or daytime variability or assoc excess/ purulent sputum or mucus plugs or hemoptysis  or chest tightness, subjective wheeze or overt sinus or hb symptoms. No  unusual exp hx or h/o childhood pna/ asthma or knowledge of premature birth.  Sleeping ok without nocturnal  or early am exacerbation  of respiratory  c/o's or need for noct saba. Also denies any obvious fluctuation of symptoms with weather or environmental changes or other aggravating or alleviating factors except as outlined above   Current Medications, Allergies, Complete Past Medical History, Past Surgical History, Family  History, and Social History were reviewed in [employer] record.  ROS  The following are not active complaints unless bolded sore throat, dysphagia, dental problems, itching, sneezing,  nasal congestion or excess/ purulent secretions, ear ache,   fever, chills, sweats, unintended wt loss, classically  exertional cp,  orthopnea pnd or leg swelling, presyncope, palpitations, abdominal pain, anorexia, nausea, vomiting, diarrhea  or change in bowel or bladder habits, change in stools or urine, dysuria,hematuria,  rash, arthralgias, visual complaints, headache, numbness, weakness or ataxia or problems with walking or coordination,  change in mood/affect or memory.               Objective:   Physical Exam  amb wf nad    02/16/2016      200  01/07/16 198 lb (89.8 kg)  01/02/16 197 lb 12.8 oz (89.7 kg)  12/30/15 201 lb 14.4 oz (91.6 kg)    Vital signs reviewed  -  - Note on arrival 02 sats  95% on RA    HEENT: nl dentition, turbinates, and oropharynx. Nl external ear canals without cough reflex   NECK :  without JVD/Nodes/TM/ nl carotid upstrokes bilaterally   LUNGS: no acc muscle use,  Nl contour chest which is clear to A and P bilaterally without cough on insp or exp maneuvers   CV:  RRR  no s3 or murmur or increase in P2, no edema   ABD:  soft and nontender with nl inspiratory excursion in the supine position. No bruits or organomegaly, bowel sounds nl  MS:  Nl gait/ ext warm without deformities, calf tenderness, cyanosis or clubbing No obvious joint restrictions   SKIN: warm and dry without lesions    NEURO:  alert, approp, nl sensorium with  no motor deficits       I personally reviewed images and agree with radiology impression as follows:  CXR:   02/13/16 1. Cardiac enlargement and chronic bronchitic changes. 2. No acute findings identified.     Outpatient Encounter Prescriptions as of 02/16/2016  Medication Sig  . acetaminophen (TYLENOL) 500  MG tablet Take 1,000 mg by mouth 2 (two) times daily as needed for moderate pain.  Marland Kitchen albuterol (PROVENTIL HFA;VENTOLIN HFA) 108 (90 BASE) MCG/ACT inhaler 2 puffs 5 minutes apart every 4 to 6 hours as needed to rescue asthma (Patient taking differently: Inhale 2 puffs into the lungs every 4 (four) hours as needed for shortness of breath. Inhale 2 puffs into the lungs 5 minutes apart every 4 to 6 hours as needed to rescue asthma)  . allopurinol (ZYLOPRIM) 300 MG tablet TAKE 1 TABLET BY MOUTH EVERY DAY  . aspirin 81 MG tablet Take 81 mg by mouth daily.    Marland Kitchen azelastine (ASTELIN) 0.1 % nasal spray Place 2 sprays into both nostrils 2 (two) times daily. Use in each nostril as directed (Patient taking differently: Place 2 sprays into both nostrils at bedtime. Use in each nostril as directed)  . digoxin (LANOXIN) 0.25 MG tablet TAKE 1 TABLET BY MOUTH DAILY  . diltiazem (CARDIZEM) 30 MG tablet Take 1 tablet (  30 mg total) by mouth 2 (two) times daily.  Marland Kitchen erythromycin ophthalmic ointment Place 1 application into both eyes at bedtime.  . furosemide (LASIX) 40 MG tablet Take 1 tablet (40 mg total) by mouth daily.  Marland Kitchen gabapentin (NEURONTIN) 300 MG capsule TAKE ONE CAPSULE BY MOUTH 3 TIMES A DAY  . hydroxypropyl methylcellulose / hypromellose (ISOPTO TEARS / GONIOVISC) 2.5 % ophthalmic solution Place 1 drop into both eyes 3 (three) times daily.  Marland Kitchen ipratropium (ATROVENT) 0.02 % nebulizer solution Take 2.5 mLs (0.5 mg total) by nebulization every 6 (six) hours as needed for wheezing or shortness of breath.  Marland Kitchen KLOR-CON M20 20 MEQ tablet TAKE 2 TABLETS (40 MEQ TOTAL) BY MOUTH DAILY.  Marland Kitchen levalbuterol (XOPENEX) 1.25 MG/0.5ML nebulizer solution Take 1.25 mg by nebulization every 6 (six) hours as needed for wheezing or shortness of breath.  . levothyroxine (SYNTHROID, LEVOTHROID) 50 MCG tablet Take 50 mcg by mouth daily.  . meclizine (ANTIVERT) 25 MG tablet Take 1 tablet (25 mg total) by mouth 3 (three) times daily as needed  for dizziness.  . midodrine (PROAMATINE) 5 MG tablet Take 1 tablet (5 mg total) by mouth 3 (three) times daily with meals. (Patient taking differently: Take 5 mg by mouth daily. )  . montelukast (SINGULAIR) 10 MG tablet TAKE 1 TABLET EVERY DAY FOR ALLERGIES  . pravastatin (PRAVACHOL) 40 MG tablet TAKE 1 TABLET BY MOUTH AT BEDTIME FOR CHOLESTEROL  . prochlorperazine (COMPAZINE) 5 MG tablet TAKE 1 TABLET BY MOUTH 3 TIMES A DAY FOR VERTIGO OR NAUSEA  . warfarin (COUMADIN) 2 MG tablet TAKE 1 TO 2 TABLETS BY MOUTH DAILY OR AS DIRECTED (Patient taking differently: takes 2mg  on tues, thurs and sat, takes 4mg  all other days)  . [DISCONTINUED] predniSONE (DELTASONE) 20 MG tablet 2 tablets daily for 3 days, 1 tablet daily for 4 days.   Facility-Administered Encounter Medications as of 02/16/2016  Medication  . ipratropium-albuterol (DUONEB) 0.5-2.5 (3) MG/3ML nebulizer solution 3 mL

## 2016-02-16 NOTE — Assessment & Plan Note (Signed)
Clinically and radiographically resolved with minimal residual pleuritis but I would not rec any further w/u at this point as this complaint has gradually diminished - could rx with nsaids prn if becomes a problem with CT chest an option as well but doubt needed here

## 2016-02-18 DIAGNOSIS — H16223 Keratoconjunctivitis sicca, not specified as Sjogren's, bilateral: Secondary | ICD-10-CM | POA: Diagnosis not present

## 2016-02-18 DIAGNOSIS — H01021 Squamous blepharitis right upper eyelid: Secondary | ICD-10-CM | POA: Diagnosis not present

## 2016-02-18 DIAGNOSIS — H01025 Squamous blepharitis left lower eyelid: Secondary | ICD-10-CM | POA: Diagnosis not present

## 2016-02-18 DIAGNOSIS — H01022 Squamous blepharitis right lower eyelid: Secondary | ICD-10-CM | POA: Diagnosis not present

## 2016-02-18 DIAGNOSIS — H01024 Squamous blepharitis left upper eyelid: Secondary | ICD-10-CM | POA: Diagnosis not present

## 2016-02-26 ENCOUNTER — Telehealth: Payer: Self-pay | Admitting: *Deleted

## 2016-02-26 NOTE — Telephone Encounter (Signed)
Patient called and states she has a sinus infection with yellow mucus. She reports she also has an infection in her eye that is being treated with a 14 day course of Zithromax.  Per Dr Melford Aase, continue the Zithromax and reduce her Coumadin dose to 1/2 the current dose. The patient is aware of instructions.

## 2016-03-01 ENCOUNTER — Encounter: Payer: Self-pay | Admitting: Internal Medicine

## 2016-03-01 ENCOUNTER — Ambulatory Visit (INDEPENDENT_AMBULATORY_CARE_PROVIDER_SITE_OTHER): Payer: Medicare Other | Admitting: Internal Medicine

## 2016-03-01 VITALS — BP 124/70 | HR 76 | Temp 97.5°F | Resp 16 | Ht 63.0 in | Wt 203.4 lb

## 2016-03-01 DIAGNOSIS — Z7901 Long term (current) use of anticoagulants: Secondary | ICD-10-CM

## 2016-03-01 DIAGNOSIS — G608 Other hereditary and idiopathic neuropathies: Secondary | ICD-10-CM

## 2016-03-01 DIAGNOSIS — Z79899 Other long term (current) drug therapy: Secondary | ICD-10-CM

## 2016-03-01 DIAGNOSIS — E559 Vitamin D deficiency, unspecified: Secondary | ICD-10-CM

## 2016-03-01 DIAGNOSIS — G629 Polyneuropathy, unspecified: Secondary | ICD-10-CM

## 2016-03-01 DIAGNOSIS — E782 Mixed hyperlipidemia: Secondary | ICD-10-CM

## 2016-03-01 DIAGNOSIS — M1000 Idiopathic gout, unspecified site: Secondary | ICD-10-CM | POA: Diagnosis not present

## 2016-03-01 DIAGNOSIS — R7309 Other abnormal glucose: Secondary | ICD-10-CM

## 2016-03-01 DIAGNOSIS — I482 Chronic atrial fibrillation, unspecified: Secondary | ICD-10-CM

## 2016-03-01 DIAGNOSIS — I10 Essential (primary) hypertension: Secondary | ICD-10-CM | POA: Diagnosis not present

## 2016-03-01 DIAGNOSIS — I951 Orthostatic hypotension: Secondary | ICD-10-CM

## 2016-03-01 LAB — CBC WITH DIFFERENTIAL/PLATELET
BASOS PCT: 1 %
Basophils Absolute: 94 cells/uL (ref 0–200)
Eosinophils Absolute: 376 cells/uL (ref 15–500)
Eosinophils Relative: 4 %
HEMATOCRIT: 43.9 % (ref 35.0–45.0)
HEMOGLOBIN: 14.5 g/dL (ref 11.7–15.5)
LYMPHS ABS: 2350 {cells}/uL (ref 850–3900)
Lymphocytes Relative: 25 %
MCH: 32.2 pg (ref 27.0–33.0)
MCHC: 33 g/dL (ref 32.0–36.0)
MCV: 97.6 fL (ref 80.0–100.0)
MONO ABS: 752 {cells}/uL (ref 200–950)
MPV: 10.1 fL (ref 7.5–12.5)
Monocytes Relative: 8 %
Neutro Abs: 5828 cells/uL (ref 1500–7800)
Neutrophils Relative %: 62 %
Platelets: 232 10*3/uL (ref 140–400)
RBC: 4.5 MIL/uL (ref 3.80–5.10)
RDW: 15.6 % — AB (ref 11.0–15.0)
WBC: 9.4 10*3/uL (ref 3.8–10.8)

## 2016-03-01 LAB — PROTIME-INR
INR: 1.9 — ABNORMAL HIGH
Prothrombin Time: 20 s — ABNORMAL HIGH (ref 9.0–11.5)

## 2016-03-01 MED ORDER — GABAPENTIN 600 MG PO TABS: ORAL_TABLET | ORAL | 1 refills | Status: DC

## 2016-03-01 NOTE — Progress Notes (Signed)
[facility] [name], M.D.        Uvaldo Bristle. Silverio Lay, P.A.-C       Starlyn Skeans, P.A.-C  [facility]                [address] [place] [address] Telephone [phone] Telefax [phone] ______________________________________________________________________     This very nice 75 y.o.  MWF presents for  follow up with Hypertension, Hyperlipidemia, Pre-Diabetes and Vitamin D Deficiency.      Patient is treated for HTN circa 1999 & BP has been controlled at home. Today's BP is normalized at 124/70.  Patient was hospitalized 10/21-24 with Hypotension and rapid Afib and had her Losartan d/c'd and added Midodrine and Diltiazem for rate control. Since then patient has been tapered on her Midodrine with BP remaining stable. Patient has had no complaints of any cardiac type chest pain, palpitations, dyspnea/orthopnea/PND, dizziness, claudication, or dependent edema.     Hyperlipidemia is controlled with diet & meds. Patient denies myalgias or other med SE's. Last Lipids were at tgoal albeit elevated Trig's: Lab Results  Component Value Date   CHOL 137 12/09/2015   HDL 24 (L) 12/09/2015   LDLCALC 61 12/09/2015   TRIG 258 (H) 12/09/2015   CHOLHDL 5.7 (H) 12/09/2015      Also, the patient has history of Morbid Obesity (BMI 35+) / PreDiabetes with A1c 5.7% in 2013 and has had no symptoms of reactive hypoglycemia, diabetic polys, paresthesias or visual blurring.  Last A1c was at goal: Lab Results  Component Value Date   HGBA1C 5.6 12/09/2015      Further, the patient also has history of Vitamin D Deficiency in 2008 of "33" and supplements vitamin D without any suspected side-effects. Last vitamin D was at goal:  Lab Results  Component Value Date   VD25OH 73 12/09/2015   Current Outpatient Prescriptions on File Prior to Visit  Medication Sig  . acetaminophen (TYLENOL) 500 MG tablet Take  1,000 mg by mouth 2 (two) times daily as needed for moderate pain.  Marland Kitchen allopurinol (ZYLOPRIM) 300 MG tablet TAKE 1 TABLET BY MOUTH EVERY DAY  . aspirin 81 MG tablet Take 81 mg by mouth daily.    Marland Kitchen azelastine (ASTELIN) 0.1 % nasal spray Place 2 sprays into both nostrils 2 (two) times daily. Use in each nostril as directed (Patient taking differently: Place 2 sprays into both nostrils at bedtime. Use in each nostril as directed)  . digoxin (LANOXIN) 0.25 MG tablet TAKE 1 TABLET BY MOUTH DAILY  . diltiazem (CARDIZEM) 30 MG tablet Take 1 tablet (30 mg total) by mouth 2 (two) times daily.  Marland Kitchen erythromycin ophthalmic ointment Place 1 application into both eyes at bedtime.  . furosemide (LASIX) 40 MG tablet Take 1 tablet (40 mg total) by mouth daily.  Marland Kitchen gabapentin (NEURONTIN) 300 MG capsule TAKE ONE CAPSULE BY MOUTH 3 TIMES A DAY  . hydroxypropyl methylcellulose / hypromellose (ISOPTO TEARS / GONIOVISC) 2.5 % ophthalmic solution Place 1 drop into both eyes 3 (three) times daily.  Marland Kitchen KLOR-CON M20 20 MEQ tablet TAKE 2 TABLETS (40 MEQ TOTAL) BY MOUTH DAILY.  Marland Kitchen levalbuterol (XOPENEX) 1.25 MG/0.5ML nebulizer solution Take 1.25 mg by nebulization every 6 (six) hours as needed for wheezing or shortness of breath.  . levothyroxine (SYNTHROID, LEVOTHROID) 50 MCG tablet Take 50 mcg by mouth daily.  Marland Kitchen  meclizine (ANTIVERT) 25 MG tablet Take 1 tablet (25 mg total) by mouth 3 (three) times daily as needed for dizziness.  . midodrine (PROAMATINE) 5 MG tablet Take 1 tablet (5 mg total) by mouth 3 (three) times daily with meals. (Patient taking differently: Take 5 mg by mouth daily. )  . montelukast (SINGULAIR) 10 MG tablet TAKE 1 TABLET EVERY DAY FOR ALLERGIES  . pravastatin (PRAVACHOL) 40 MG tablet TAKE 1 TABLET BY MOUTH AT BEDTIME FOR CHOLESTEROL  . prochlorperazine (COMPAZINE) 5 MG tablet TAKE 1 TABLET BY MOUTH 3 TIMES A DAY FOR VERTIGO OR NAUSEA  . warfarin (COUMADIN) 2 MG tablet TAKE 1 TO 2 TABLETS BY MOUTH DAILY OR AS  DIRECTED (Patient taking differently: takes 2mg  on tues, thurs and sat, takes 4mg  all other days)  . albuterol (PROVENTIL HFA;VENTOLIN HFA) 108 (90 BASE) MCG/ACT inhaler 2 puffs 5 minutes apart every 4 to 6 hours as needed to rescue asthma (Patient taking differently: Inhale 2 puffs into the lungs every 4 (four) hours as needed for shortness of breath. Inhale 2 puffs into the lungs 5 minutes apart every 4 to 6 hours as needed to rescue asthma)  . ipratropium (ATROVENT) 0.02 % nebulizer solution Take 2.5 mLs (0.5 mg total) by nebulization every 6 (six) hours as needed for wheezing or shortness of breath. (Patient not taking: Reported on 03/01/2016)   Current Facility-Administered Medications on File Prior to Visit  Medication  . ipratropium-albuterol (DUONEB) 0.5-2.5 (3) MG/3ML nebulizer solution 3 mL   Allergies  Allergen Reactions  . Amiodarone Other (See Comments)    PULMONARY TOXICITY  . Diovan [Valsartan] Other (See Comments)    HYPOTENSION  . Doxycycline Diarrhea and Other (See Comments)    VISUAL DISTURBANCE  . Flexeril [Cyclobenzaprine] Other (See Comments)    FATIGUE  . Keflex [Cephalexin] Diarrhea  . Verapamil Other (See Comments)    EDEMA  . Advair Diskus [Fluticasone-Salmeterol] Other (See Comments)    SKIN CHANGES.  Can tolerate low dose of prednisone without complications  . Codeine Other (See Comments)    unknown   PMHx:   Past Medical History:  Diagnosis Date  . Aortic stenosis    mild AS witm mild to mod AR by 11/2012 echo  . Arthritis   . Atrial fibrillation ([place])   . Atrial flutter ([place])   . Bronchitis, chronic ([place])   . Cervical dysplasia   . Coronary artery disease   . Cystocele    either on bladder or kidneys patient is unsure but stated physician will watch it  . Dizziness    If patient gets out of bed too fast  . Family history of anesthesia complication    Hard for daughter to wake up after Anesthesia  . Gout   . H/O hiatal hernia   . History of  Bell's palsy   . Migraine headache   . Osteopenia   . Rosacea conjunctivitis    Right eye is worse than left eye  . Seasonal allergies   . Shortness of breath    with exertion  . Thyroid disease    Hypo  . Tobacco abuse   . Unspecified vitamin D deficiency    Immunization History  Administered Date(s) Administered  . Influenza, High Dose Seasonal PF 12/20/2013, 12/09/2014, 11/14/2015  . Influenza-Unspecified 01/02/2013  . Pneumococcal Conjugate-13 01/23/2014  . Pneumococcal-Unspecified 03/15/1993, 05/31/2008  . Td 03/16/2007  . Varicella 02/19/2008   Past Surgical History:  Procedure Laterality Date  . ABDOMINAL AORTIC ENDOVASCULAR STENT  GRAFT N/A 04/11/2013   Procedure: ABDOMINAL AORTIC ENDOVASCULAR STENT GRAFT WITH RIGHT FEMORAL PATCH ANGIOPLASTY;  Surgeon: Mal Misty, MD;  Location: [facility];  Service: Vascular;  Laterality: N/A;  . BREAST SURGERY     LEFT BREAST BIOPSY  . broken leg Left 1970s  . CARDIAC CATHETERIZATION    . CHOLECYSTECTOMY  1972  . COLONOSCOPY    . EYE SURGERY Bilateral   . Laser vein procedure    . REFRACTIVE SURGERY    . TONSILLECTOMY     FHx:    Reviewed / unchanged  SHx:    Reviewed / unchanged  Systems Review:  Constitutional: Denies fever, chills, wt changes, headaches, insomnia, fatigue, night sweats, change in appetite. Eyes: Denies redness, blurred vision, diplopia, discharge, itchy, watery eyes.  ENT: Denies discharge, congestion, post nasal drip, epistaxis, sore throat, earache, hearing loss, dental pain, tinnitus, vertigo, sinus pain, snoring.  CV: Denies chest pain, palpitations, irregular heartbeat, syncope, dyspnea, diaphoresis, orthopnea, PND, claudication or edema. Respiratory: denies cough, dyspnea, DOE, pleurisy, hoarseness, laryngitis, wheezing.  Gastrointestinal: Denies dysphagia, odynophagia, heartburn, reflux, water brash, abdominal pain or cramps, nausea, vomiting, bloating, diarrhea, constipation, hematemesis, melena,  hematochezia  or hemorrhoids. Genitourinary: Denies dysuria, frequency, urgency, nocturia, hesitancy, discharge, hematuria or flank pain. Musculoskeletal: Denies arthralgias, myalgias, stiffness, jt. swelling, pain, limping or strain/sprain.  Skin: Denies pruritus, rash, hives, warts, acne, eczema or change in skin lesion(s). Neuro: No weakness, tremor, incoordination, spasms, paresthesia or pain. Psychiatric: Denies confusion, memory loss or sensory loss. Endo: Denies change in weight, skin or hair change.  Heme/Lymph: No excessive bleeding, bruising or enlarged lymph nodes.  Physical Exam BP 124/70   Pulse 76   Temp 97.5 F (36.4 C)   Resp 16   Ht 5\' 3"  (1.6 m)   Wt 203 lb 6.4 oz (92.3 kg)   BMI 36.03 kg/m   Appears well nourished and in no distress.  Eyes: PERRLA, EOMs, conjunctiva no swelling or erythema. Sinuses: No frontal/maxillary tenderness ENT/Mouth: EAC's clear, TM's nl w/o erythema, bulging. Nares clear w/o erythema, swelling, exudates. Oropharynx clear without erythema or exudates. Oral hygiene is good. Tongue normal, non obstructing. Hearing intact.  Neck: Supple. Thyroid nl. Car 2+/2+ without bruits, nodes or JVD. Chest: Respirations nl with BS clear & equal w/o rales, rhonchi, wheezing or stridor.  Cor: Heart sounds normal w/ sl irregular rate and rhythm without sig. murmurs, gallops, clicks, or rubs. Peripheral pulses normal and equal  With 1-2 (+) ankle/pretibial edema.  Abdomen: Soft & bowel sounds normal. Non-tender w/o guarding, rebound, hernias, masses, or organomegaly.  Lymphatics: Unremarkable.  Musculoskeletal: Full ROM all peripheral extremities, joint stability, 5/5 strength, and normal gait.  Skin: Warm, dry without exposed rashes, lesions or ecchymosis apparent.  Neuro: Cranial nerves intact, reflexes equal bilaterally. Sensory-motor testing grossly intact. Tendon reflexes grossly intact.  Pysch: Alert & oriented x 3.  Insight and judgement nl &  appropriate. No ideations.  Assessment and Plan:  1. Essential hypertension  - Continue medication, monitor blood pressure at home.  - Continue DASH diet. Reminder to go to the ER if any CP,  SOB, nausea, dizziness, severe HA, changes vision/speech,  left arm numbness and tingling and jaw pain.  - CBC with Differential/Platelet - BASIC METABOLIC PANEL WITH GFR - TSH  2. Hyperlipidemia  - Continue diet/meds, exercise,& lifestyle modifications.  - Continue monitor periodic cholesterol/liver & renal functions   - Hepatic function panel - Lipid panel - TSH  3. Other abnormal glucose  -  Continue diet, exercise, lifestyle modifications.  - Monitor appropriate labs. - Hemoglobin A1c  4. Vitamin D deficiency - Continue supplementation - VITAMIN D 25 Hydroxy   5. Chronic atrial fibrillation (HCC)  - Protime-INR - Digoxin level  6. Orthostatic hypotension   7. Long term current use of anticoagulant therapy  - Protime-INR  8. Peripheral sensory neuropathy (HCC)  - gabapentin (NEURONTIN) 600 MG tablet; Take 1/2 to 1 tablet 3 x / day for NeuropathyPain  Dispense: 270 tablet; Refill: 1  9. Idiopathic gout, unspecified chronicity, unspecified site  - Uric acid  10. Medication management  - CBC with Differential/Platelet - BASIC METABOLIC PANEL WITH GFR - Hepatic function panel - Magnesium - Digoxin level       Recommended regular exercise, BP monitoring, weight control, and discussed med and SE's. Recommended labs to assess and monitor clinical status. Further disposition pending results of labs. Over 30 minutes of exam, counseling, chart review was performed

## 2016-03-01 NOTE — Patient Instructions (Signed)

## 2016-03-02 LAB — TSH: TSH: 7.51 mIU/L — ABNORMAL HIGH

## 2016-03-02 LAB — BASIC METABOLIC PANEL WITH GFR
BUN: 16 mg/dL (ref 7–25)
CO2: 18 mmol/L — AB (ref 20–31)
Calcium: 9.4 mg/dL (ref 8.6–10.4)
Chloride: 104 mmol/L (ref 98–110)
Creat: 1.32 mg/dL — ABNORMAL HIGH (ref 0.60–0.93)
GFR, EST AFRICAN AMERICAN: 46 mL/min — AB (ref >=60)
GFR, EST NON AFRICAN AMERICAN: 40 mL/min — AB (ref >=60)
GLUCOSE: 136 mg/dL — AB (ref 65–99)
POTASSIUM: 4.4 mmol/L (ref 3.5–5.3)
Sodium: 140 mmol/L (ref 135–146)

## 2016-03-02 LAB — LIPID PANEL
CHOL/HDL RATIO: 6.6 ratio — AB (ref <5.0)
Cholesterol: 152 mg/dL (ref <200)
HDL: 23 mg/dL — AB (ref >50)
LDL CALC: 72 mg/dL (ref <100)
TRIGLYCERIDES: 284 mg/dL — AB (ref <150)
VLDL: 57 mg/dL — AB (ref <30)

## 2016-03-02 LAB — HEPATIC FUNCTION PANEL
ALK PHOS: 27 U/L — AB (ref 33–130)
ALT: 13 U/L (ref 6–29)
AST: 15 U/L (ref 10–35)
Albumin: 3.8 g/dL (ref 3.6–5.1)
BILIRUBIN DIRECT: 0.1 mg/dL (ref <=0.2)
BILIRUBIN INDIRECT: 0.5 mg/dL (ref 0.2–1.2)
TOTAL PROTEIN: 6 g/dL — AB (ref 6.1–8.1)
Total Bilirubin: 0.6 mg/dL (ref 0.2–1.2)

## 2016-03-02 LAB — DIGOXIN LEVEL: DIGOXIN LVL: 1.2 ug/L (ref 0.8–2.0)

## 2016-03-02 LAB — MAGNESIUM: Magnesium: 1.9 mg/dL (ref 1.5–2.5)

## 2016-03-02 LAB — VITAMIN D 25 HYDROXY (VIT D DEFICIENCY, FRACTURES): Vit D, 25-Hydroxy: 59 ng/mL (ref 30–100)

## 2016-03-02 LAB — HEMOGLOBIN A1C
Hgb A1c MFr Bld: 5.2 % (ref <5.7)
MEAN PLASMA GLUCOSE: 103 mg/dL

## 2016-03-02 LAB — URIC ACID: URIC ACID, SERUM: 5.1 mg/dL (ref 2.5–7.0)

## 2016-03-10 ENCOUNTER — Other Ambulatory Visit: Payer: Self-pay | Admitting: Physician Assistant

## 2016-03-17 ENCOUNTER — Other Ambulatory Visit: Payer: Self-pay | Admitting: *Deleted

## 2016-03-17 MED ORDER — DILTIAZEM HCL ER COATED BEADS 120 MG PO CP24: 120.0000 mg | ORAL_CAPSULE | Freq: Every day | ORAL | 1 refills | Status: DC

## 2016-03-24 ENCOUNTER — Other Ambulatory Visit: Payer: Self-pay | Admitting: Physician Assistant

## 2016-03-25 ENCOUNTER — Other Ambulatory Visit: Payer: Self-pay

## 2016-03-25 MED ORDER — POTASSIUM CHLORIDE CRYS ER 20 MEQ PO TBCR: 40.0000 meq | EXTENDED_RELEASE_TABLET | Freq: Every day | ORAL | 1 refills | Status: DC

## 2016-03-29 ENCOUNTER — Other Ambulatory Visit: Payer: Self-pay

## 2016-03-29 MED ORDER — POTASSIUM CHLORIDE CRYS ER 20 MEQ PO TBCR: 40.0000 meq | EXTENDED_RELEASE_TABLET | Freq: Every day | ORAL | 1 refills | Status: DC

## 2016-04-05 ENCOUNTER — Ambulatory Visit: Payer: Self-pay | Admitting: Internal Medicine

## 2016-04-12 ENCOUNTER — Ambulatory Visit (INDEPENDENT_AMBULATORY_CARE_PROVIDER_SITE_OTHER): Payer: Medicare Other | Admitting: Internal Medicine

## 2016-04-12 ENCOUNTER — Encounter: Payer: Self-pay | Admitting: Internal Medicine

## 2016-04-12 VITALS — BP 128/76 | HR 104 | Temp 98.2°F | Resp 18 | Ht 63.0 in | Wt 205.0 lb

## 2016-04-12 DIAGNOSIS — Z7901 Long term (current) use of anticoagulants: Secondary | ICD-10-CM | POA: Diagnosis not present

## 2016-04-12 DIAGNOSIS — I482 Chronic atrial fibrillation, unspecified: Secondary | ICD-10-CM

## 2016-04-12 DIAGNOSIS — J45909 Unspecified asthma, uncomplicated: Secondary | ICD-10-CM

## 2016-04-12 DIAGNOSIS — S93492A Sprain of other ligament of left ankle, initial encounter: Secondary | ICD-10-CM

## 2016-04-12 LAB — PROTIME-INR
INR: 1.3 — ABNORMAL HIGH
Prothrombin Time: 13.7 s — ABNORMAL HIGH (ref 9.0–11.5)

## 2016-04-12 MED ORDER — ALBUTEROL SULFATE (2.5 MG/3ML) 0.083% IN NEBU: 2.5000 mg | INHALATION_SOLUTION | Freq: Four times a day (QID) | RESPIRATORY_TRACT | 0 refills | Status: DC | PRN

## 2016-04-12 MED ORDER — GUAIFENESIN ER 600 MG PO TB12: 600.0000 mg | ORAL_TABLET | Freq: Two times a day (BID) | ORAL | 2 refills | Status: DC

## 2016-04-12 MED ORDER — FLUTICASONE-UMECLIDIN-VILANT 100-62.5-25 MCG/INH IN AEPB: 1.0000 | INHALATION_SPRAY | Freq: Every day | RESPIRATORY_TRACT | 0 refills | Status: DC

## 2016-04-12 NOTE — Progress Notes (Signed)
Assessment and Plan:   1. Intrinsic asthma -given increased sputum production and has done several courses of abx will try 2 week sample of trelegy once daily -albuterol prn -mucinex 600 BID -nasal saline rinses -cont allergy medications  2. Chronic atrial fibrillation (HCC) -cont digoxin  3. Long-term (current) use of anticoagulants -cont coumadin -dose adjust if necessary - Protime-INR  4. Sprain of anterior talofibular ligament of left ankle, initial encounter -cont RICE therapy -seeing Dr. Rush Farmer -mild lateral malleoli tenderness to palpation consider xray -tylenol prn    HPI 76 y.o.female presents for 1 month follow up of long term coumadin use secondary to atrial fibrillation.  She reports that she has been having some trouble with her ankle.  She thinks that she turned it.  She reports that she is taking tylenol, elevating, and also trying to stay off it.  She is still is having a lot of congestion.  She reports that her sinuses are still bothering her, but she feels like she is spitting up yellow stuff.  She reports that she is taking mucinex for 12 days.  She reports that this is not helping much.  She is not using albuterol regularly but knows this helps when her breathing gets bad.  Past Medical History:  Diagnosis Date  . Aortic stenosis    mild AS witm mild to mod AR by 11/2012 echo  . Arthritis   . Atrial fibrillation ([place])   . Atrial flutter ([place])   . Bronchitis, chronic ([place])   . Cervical dysplasia   . Coronary artery disease   . Cystocele    either on bladder or kidneys patient is unsure but stated physician will watch it  . Dizziness    If patient gets out of bed too fast  . Family history of anesthesia complication    Hard for daughter to wake up after Anesthesia  . Gout   . H/O hiatal hernia   . History of Bell's palsy   . Migraine headache   . Osteopenia   . Rosacea conjunctivitis    Right eye is worse than left eye  . Seasonal allergies   .  Shortness of breath    with exertion  . Thyroid disease    Hypo  . Tobacco abuse   . Unspecified vitamin D deficiency      Allergies  Allergen Reactions  . Amiodarone Other (See Comments)    PULMONARY TOXICITY  . Diovan [Valsartan] Other (See Comments)    HYPOTENSION  . Doxycycline Diarrhea and Other (See Comments)    VISUAL DISTURBANCE  . Flexeril [Cyclobenzaprine] Other (See Comments)    FATIGUE  . Keflex [Cephalexin] Diarrhea  . Verapamil Other (See Comments)    EDEMA  . Advair Diskus [Fluticasone-Salmeterol] Other (See Comments)    SKIN CHANGES.  Can tolerate low dose of prednisone without complications  . Codeine Other (See Comments)    unknown      Current Outpatient Prescriptions on File Prior to Visit  Medication Sig Dispense Refill  . acetaminophen (TYLENOL) 500 MG tablet Take 1,000 mg by mouth 2 (two) times daily as needed for moderate pain.    Marland Kitchen allopurinol (ZYLOPRIM) 300 MG tablet TAKE 1 TABLET BY MOUTH EVERY DAY 90 tablet 1  . aspirin 81 MG tablet Take 81 mg by mouth daily.      Marland Kitchen azelastine (ASTELIN) 0.1 % nasal spray Place 2 sprays into both nostrils 2 (two) times daily. Use in each nostril as directed (Patient taking differently:  Place 2 sprays into both nostrils at bedtime. Use in each nostril as directed) 30 mL 2  . digoxin (LANOXIN) 0.25 MG tablet TAKE 1 TABLET BY MOUTH DAILY 90 tablet 1  . diltiazem (CARDIZEM CD) 120 MG 24 hr capsule Take 1 capsule (120 mg total) by mouth daily. 90 capsule 1  . erythromycin ophthalmic ointment Place 1 application into both eyes at bedtime.    . furosemide (LASIX) 80 MG tablet     . gabapentin (NEURONTIN) 600 MG tablet Take 1/2 to 1 tablet 3 x / day for NeuropathyPain 270 tablet 1  . hydroxypropyl methylcellulose / hypromellose (ISOPTO TEARS / GONIOVISC) 2.5 % ophthalmic solution Place 1 drop into both eyes 3 (three) times daily.    Marland Kitchen ipratropium (ATROVENT) 0.02 % nebulizer solution Take 2.5 mLs (0.5 mg total) by  nebulization every 6 (six) hours as needed for wheezing or shortness of breath. 500 mL 1  . levalbuterol (XOPENEX) 1.25 MG/0.5ML nebulizer solution Take 1.25 mg by nebulization every 6 (six) hours as needed for wheezing or shortness of breath. 1 each 3  . levothyroxine (SYNTHROID, LEVOTHROID) 50 MCG tablet TAKE 1 TABLET EVERY DAY 90 tablet 2  . meclizine (ANTIVERT) 25 MG tablet Take 1 tablet (25 mg total) by mouth 3 (three) times daily as needed for dizziness. 60 tablet 1  . montelukast (SINGULAIR) 10 MG tablet TAKE 1 TABLET EVERY DAY FOR ALLERGIES 90 tablet 1  . potassium chloride SA (KLOR-CON M20) 20 MEQ tablet Take 2 tablets (40 mEq total) by mouth daily. 180 tablet 1  . pravastatin (PRAVACHOL) 40 MG tablet TAKE 1 TABLET BY MOUTH AT BEDTIME FOR CHOLESTEROL 90 tablet 1  . prochlorperazine (COMPAZINE) 5 MG tablet TAKE 1 TABLET BY MOUTH 3 TIMES A DAY FOR VERTIGO OR NAUSEA 90 tablet 1  . warfarin (COUMADIN) 2 MG tablet TAKE 1 TO 2 TABLETS BY MOUTH DAILY OR AS DIRECTED 180 tablet 3  . albuterol (PROVENTIL HFA;VENTOLIN HFA) 108 (90 BASE) MCG/ACT inhaler 2 puffs 5 minutes apart every 4 to 6 hours as needed to rescue asthma (Patient taking differently: Inhale 2 puffs into the lungs every 4 (four) hours as needed for shortness of breath. Inhale 2 puffs into the lungs 5 minutes apart every 4 to 6 hours as needed to rescue asthma) 1 Inhaler 11   Current Facility-Administered Medications on File Prior to Visit  Medication Dose Route Frequency Provider Last Rate Last Dose  . ipratropium-albuterol (DUONEB) 0.5-2.5 (3) MG/3ML nebulizer solution 3 mL  3 mL Nebulization Once Vicie Mutters, PA-C        Review of Systems  Constitutional: Negative for chills, fever and malaise/fatigue.  HENT: Positive for congestion and sinus pain. Negative for ear pain, hearing loss and sore throat.   Respiratory: Positive for cough and sputum production. Negative for hemoptysis, shortness of breath and wheezing.    Cardiovascular: Negative for chest pain, palpitations, orthopnea, claudication, leg swelling and PND.  Gastrointestinal: Negative for blood in stool and melena.  Musculoskeletal: Positive for joint pain.     Physical Exam: Filed Weights   04/12/16 0919  Weight: 205 lb (93 kg)   BP 128/76   Pulse (!) 104   Temp 98.2 F (36.8 C) (Temporal)   Resp 18   Ht 5\' 3"  (1.6 m)   Wt 205 lb (93 kg)   SpO2 93%   BMI 36.31 kg/m  General Appearance: Well developed well nourished, non-toxic appearing in no apparent distress. Eyes: PERRLA, EOMs, conjunctiva w/ no  swelling or erythema or discharge Sinuses: No Frontal/maxillary tenderness ENT/Mouth: Ear canals clear without swelling or erythema.  TM's normal bilaterally with no retractions, bulging, or loss of landmarks.   Neck: Supple, thyroid normal, no notable JVD  Respiratory: Respiratory effort normal, Clear breath sounds anteriorly and posteriorly bilaterally without rales, rhonchi, wheezing or stridor. No retractions or accessory muscle usage. Cardio: Ireg Ireg with no MRGs.   Abdomen: Soft, + BS.  Non tender, no guarding, rebound, hernias, masses.  Musculoskeletal: Full ROM, 5/5 strength, normal gait.  Skin: Warm, dry without rashes  Neuro: Awake and oriented X 3, Cranial nerves intact. Normal muscle tone, no cerebellar symptoms. Sensation intact.  Psych: normal affect, Insight and Judgment appropriate.     Starlyn Skeans, PA-C 9:26 AM [facility]

## 2016-04-13 ENCOUNTER — Encounter: Payer: Self-pay | Admitting: Family

## 2016-04-14 NOTE — Progress Notes (Signed)
Pt aware of lab results & voiced understanding of those results.

## 2016-04-20 ENCOUNTER — Ambulatory Visit (INDEPENDENT_AMBULATORY_CARE_PROVIDER_SITE_OTHER): Payer: Medicare Other | Admitting: Family

## 2016-04-20 ENCOUNTER — Ambulatory Visit (HOSPITAL_COMMUNITY)
Admission: RE | Admit: 2016-04-20 | Discharge: 2016-04-20 | Disposition: A | Discharge status: Home / Self Care | Payer: Medicare Other | Source: Ambulatory Visit | Attending: Vascular Surgery | Admitting: Vascular Surgery

## 2016-04-20 ENCOUNTER — Ambulatory Visit (INDEPENDENT_AMBULATORY_CARE_PROVIDER_SITE_OTHER)
Admission: RE | Admit: 2016-04-20 | Discharge: 2016-04-20 | Disposition: A | Discharge status: Home / Self Care | Payer: Medicare Other | Source: Ambulatory Visit | Attending: Vascular Surgery | Admitting: Vascular Surgery

## 2016-04-20 ENCOUNTER — Encounter: Payer: Self-pay | Admitting: Family

## 2016-04-20 ENCOUNTER — Other Ambulatory Visit: Payer: Self-pay

## 2016-04-20 VITALS — BP 142/66 | HR 86 | Temp 97.0°F | Resp 16 | Ht 63.0 in | Wt 200.0 lb

## 2016-04-20 DIAGNOSIS — I714 Abdominal aortic aneurysm, without rupture, unspecified: Secondary | ICD-10-CM

## 2016-04-20 DIAGNOSIS — Z95828 Presence of other vascular implants and grafts: Secondary | ICD-10-CM | POA: Diagnosis not present

## 2016-04-20 DIAGNOSIS — I739 Peripheral vascular disease, unspecified: Secondary | ICD-10-CM

## 2016-04-20 MED ORDER — LOSARTAN POTASSIUM 100 MG PO TABS: 100.0000 mg | ORAL_TABLET | Freq: Every day | ORAL | 1 refills | Status: DC

## 2016-04-20 MED ORDER — POTASSIUM CHLORIDE CRYS ER 20 MEQ PO TBCR: 40.0000 meq | EXTENDED_RELEASE_TABLET | Freq: Every day | ORAL | 1 refills | Status: DC

## 2016-04-20 NOTE — Progress Notes (Signed)
[facility] [place]  CC: Follow up s/p EVAR  History of Present Illness  Katie [name] is a 76 y.o. (January 11, 1941) female patient of Dr. Kellie Simmering returns for continued follow-up regarding her abdominal aortic aneurysm stent graft repair using Gore C3 endovascular graft performed in 2015. Patient has no new symptoms of abdominal or back discomfort.   At her 04-22-15 visit with Dr. Kellie Simmering she had a CT angiogram performed which Dr. Kellie Simmering reviewed and interpreted. This revealed aneurysm sac to be contracted down nicely to 3.2 cm in maximum diameter around the graft. Graft was in excellent position. Pt denied specific claudication symptoms but did have weakness in her right leg. At times she stated it was difficult to lift this against gravity. She also has chronic edema in both ankles and takes a diuretic on a daily basis.  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 Kellie Simmering suggested that patient discuss her back problem and right leg weakness with Dr. Melford Aase about possible referral. She did see an orhopod re this, but her back pain has since subsided. ABIs in the past were 1.0 or greater Patient was to return in 1 year with duplex scan of aortic aneurysm stent graft in our office and recheck ABIs.   She denies chest pain or dyspnea on exertion.  Has chronic A. Fib on Coumadin.  She had pneumonia, flu, back pain, severely hypotensive in October 2017, was hospitalized at [facility] x 7 days.   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   She denies any further back pain since she had pneumonia, denies abdominal pain.   She injured her left ankle 2 weeks ago, but is performing regular seated exercises.   Pt Diabetic: No Pt smoker: former smoker, quit in 2004, states she smoked x 40 years   She takes coumadin for atrial fib.    Past Medical History:  Diagnosis Date  . Aortic stenosis    mild AS  witm mild to mod AR by 11/2012 echo  . Arthritis   . Atrial fibrillation ([place])   . Atrial flutter ([place])   . Bronchitis, chronic ([place])   . Cervical dysplasia   . Coronary artery disease   . Cystocele    either on bladder or kidneys patient is unsure but stated physician will watch it  . Dizziness    If patient gets out of bed too fast  . Family history of anesthesia complication    Hard for daughter to wake up after Anesthesia  . Gout   . H/O hiatal hernia   . History of Bell's palsy   . Migraine headache   . Osteopenia   . Rosacea conjunctivitis    Right eye is worse than left eye  . Seasonal allergies   . Shortness of breath    with exertion  . Thyroid disease    Hypo  . Tobacco abuse   . Unspecified vitamin D deficiency    Past Surgical History:  Procedure Laterality Date  . ABDOMINAL AORTIC ENDOVASCULAR STENT GRAFT N/A 04/11/2013   Procedure: ABDOMINAL AORTIC ENDOVASCULAR STENT GRAFT WITH RIGHT FEMORAL PATCH ANGIOPLASTY;  Surgeon: Mal Misty, MD;  Location: [facility];  Service: Vascular;  Laterality: N/A;  . BREAST SURGERY     LEFT BREAST BIOPSY  . broken leg Left 1970s  . CARDIAC CATHETERIZATION    . CHOLECYSTECTOMY  1972  . COLONOSCOPY    .  EYE SURGERY Bilateral   . Laser vein procedure    . REFRACTIVE SURGERY    . TONSILLECTOMY     Social History Social History  Substance Use Topics  . Smoking status: Former Smoker    Types: Cigarettes    Quit date: 08/10/2002  . Smokeless tobacco: Never Used     Comment: History of tobacco abuse  . Alcohol use 0.0 oz/week     Comment: rare   Family History Family History  Problem Relation Age of Onset  . Cirrhosis Mother   . Cancer Mother 45    PANCREAS  . Heart defect Sister   . Breast cancer Sister     age 67  . Heart disease Sister   . Stroke Sister   . Alcohol abuse Father   . Depression Father   . Hypertension Brother   . Hyperlipidemia Son   . Heart disease Daughter    Current Outpatient Prescriptions  on File Prior to Visit  Medication Sig Dispense Refill  . acetaminophen (TYLENOL) 500 MG tablet Take 1,000 mg by mouth 2 (two) times daily as needed for moderate pain.    Marland Kitchen albuterol (PROVENTIL) (2.5 MG/3ML) 0.083% nebulizer solution Take 3 mLs (2.5 mg total) by nebulization every 6 (six) hours as needed for wheezing or shortness of breath. 30 vial 0  . allopurinol (ZYLOPRIM) 300 MG tablet TAKE 1 TABLET BY MOUTH EVERY DAY 90 tablet 1  . aspirin 81 MG tablet Take 81 mg by mouth daily.      Marland Kitchen azelastine (ASTELIN) 0.1 % nasal spray Place 2 sprays into both nostrils 2 (two) times daily. Use in each nostril as directed (Patient taking differently: Place 2 sprays into both nostrils at bedtime. Use in each nostril as directed) 30 mL 2  . digoxin (LANOXIN) 0.25 MG tablet TAKE 1 TABLET BY MOUTH DAILY 90 tablet 1  . diltiazem (CARDIZEM CD) 120 MG 24 hr capsule Take 1 capsule (120 mg total) by mouth daily. 90 capsule 1  . Fluticasone-Umeclidin-Vilant (TRELEGY ELLIPTA) 100-62.5-25 MCG/INH AEPB Inhale 1 puff into the lungs daily. 14 each 0  . furosemide (LASIX) 80 MG tablet     . gabapentin (NEURONTIN) 600 MG tablet Take 1/2 to 1 tablet 3 x / day for NeuropathyPain 270 tablet 1  . guaiFENesin (MUCINEX) 600 MG 12 hr tablet Take 1 tablet (600 mg total) by mouth 2 (two) times daily. 60 tablet 2  . hydroxypropyl methylcellulose / hypromellose (ISOPTO TEARS / GONIOVISC) 2.5 % ophthalmic solution Place 1 drop into both eyes 3 (three) times daily.    Marland Kitchen ipratropium (ATROVENT) 0.02 % nebulizer solution Take 2.5 mLs (0.5 mg total) by nebulization every 6 (six) hours as needed for wheezing or shortness of breath. 500 mL 1  . levalbuterol (XOPENEX) 1.25 MG/0.5ML nebulizer solution Take 1.25 mg by nebulization every 6 (six) hours as needed for wheezing or shortness of breath. 1 each 3  . levothyroxine (SYNTHROID, LEVOTHROID) 50 MCG tablet TAKE 1 TABLET EVERY DAY 90 tablet 2  . meclizine (ANTIVERT) 25 MG tablet Take 1 tablet  (25 mg total) by mouth 3 (three) times daily as needed for dizziness. 60 tablet 1  . montelukast (SINGULAIR) 10 MG tablet TAKE 1 TABLET EVERY DAY FOR ALLERGIES 90 tablet 1  . pravastatin (PRAVACHOL) 40 MG tablet TAKE 1 TABLET BY MOUTH AT BEDTIME FOR CHOLESTEROL 90 tablet 1  . prochlorperazine (COMPAZINE) 5 MG tablet TAKE 1 TABLET BY MOUTH 3 TIMES A DAY FOR VERTIGO OR NAUSEA  90 tablet 1  . warfarin (COUMADIN) 2 MG tablet TAKE 1 TO 2 TABLETS BY MOUTH DAILY OR AS DIRECTED 180 tablet 3  . albuterol (PROVENTIL HFA;VENTOLIN HFA) 108 (90 BASE) MCG/ACT inhaler 2 puffs 5 minutes apart every 4 to 6 hours as needed to rescue asthma (Patient taking differently: Inhale 2 puffs into the lungs every 4 (four) hours as needed for shortness of breath. Inhale 2 puffs into the lungs 5 minutes apart every 4 to 6 hours as needed to rescue asthma) 1 Inhaler 11  . erythromycin ophthalmic ointment Place 1 application into both eyes at bedtime.     No current facility-administered medications on file prior to visit.    Allergies  Allergen Reactions  . Amiodarone Other (See Comments)    PULMONARY TOXICITY  . Diovan [Valsartan] Other (See Comments)    HYPOTENSION  . Doxycycline Diarrhea and Other (See Comments)    VISUAL DISTURBANCE  . Flexeril [Cyclobenzaprine] Other (See Comments)    FATIGUE  . Keflex [Cephalexin] Diarrhea  . Verapamil Other (See Comments)    EDEMA  . Advair Diskus [Fluticasone-Salmeterol] Other (See Comments)    SKIN CHANGES.  Can tolerate low dose of prednisone without complications  . Codeine Other (See Comments)    unknown     ROS: See HPI for pertinent positives and negatives.  Physical Examination  Vitals:   04/20/16 0945 04/20/16 0954  BP: (!) 148/77 (!) 142/66  Pulse: 86 86  Resp: 16   Temp: 97 F (36.1 C)   SpO2: 98%   Weight: 200 lb (90.7 kg)   Height: 5\' 3"  (1.6 m)    Body mass index is 35.43 kg/m.  General: A&O x 3, WD, obese female  Pulmonary: Sym exp,  respirations are non labored, limited air movement in all fields, CTAB, no rales, rhonchi, or wheezing.  Cardiac: Irregular rhythm with controlled rat of 86, no murmur appreciated  Vascular: Vessel Right Left  Radial 2+Palpable 2+Palpable  Carotid  without bruit  without bruit  Aorta Not palpable N/A  Femoral Not Palpable Not Palpable  Popliteal Not palpable Not palpable  PT Not Palpable Not Palpable  DP Not Palpable Not Palpable   Gastrointestinal: soft, NTND, -G/R, - HSM, - palpable masses, - CVAT B.  Musculoskeletal: M/S 5/5 in lower extremities, 4/5 in upper extremities, extremities without ischemic changes. Left ankle brace in place.   Neurologic: Pain and light touch intact in extremities, Motor exam as listed above  Non-Invasive Vascular Imaging  ABI's today: Right: 0.98 (1.10 on 04-16-14), triphasic waveforms; TBI: 0.67 (0.75) Left: 0.89 (1.06 on 04-16-14), biphasic PT, triphasic DP; TBI: 0.63 (0.62).   EVAR Duplex (Date: 04-21-2015)  AAA sac size: 3.2 cm x 3.5 cm, based on limited visualization due to overlying bowel gas and pt body habitus.  no endoleak detected  CTA Abd/Pelvis Duplex (Date: 04-22-2015)  AAA sac size: 2.6 cm x 3.2 cm  no endoleak detected  Medical Decision Making  Katie [name] is a 76 y.o. female who presents s/p EVAR (Date: 03-11-2014).  Pt is asymptomatic with 3 mm increase in sac size in a year, compared to CTA a year ago, and based on limited visualization due to overlying bowel gas and pt body habitus. I discussed today's duplex compared to CTA a year ago with Dr. Donnetta Hutching  I discussed with the patient the importance of surveillance of the endograft.  The next endograft duplex will be scheduled for 12 months.  The patient will follow up with  [place] in 12 months with these studies.  I emphasized the importance of maximal medical management including strict control of blood pressure, blood glucose, and lipid levels, antiplatelet agents, obtaining  regular exercise, and cessation of smoking.   Thank you for allowing [place] to participate in this patient's care.  Clemon Chambers, RN, MSN, FNP-C [facility] Office: [facility] Physician: Early  04/20/2016, 10:12 AM

## 2016-04-20 NOTE — Progress Notes (Signed)
Vitals:   04/20/16 0945  BP: (!) 148/77  Pulse: 86  Resp: 16  Temp: 97 F (36.1 C)  SpO2: 98%  Weight: 200 lb (90.7 kg)  Height: 5\' 3"  (1.6 m)

## 2016-04-20 NOTE — Patient Instructions (Signed)
Before your next abdominal ultrasound:  Take two Extra-Strength Gas-X capsules at bedtime the night before the test. Take another two Extra-Strength Gas-X capsules 3 hours before the test.   

## 2016-04-21 ENCOUNTER — Other Ambulatory Visit: Payer: Self-pay | Admitting: Internal Medicine

## 2016-04-21 ENCOUNTER — Encounter (INDEPENDENT_AMBULATORY_CARE_PROVIDER_SITE_OTHER): Payer: Self-pay | Admitting: Orthopaedic Surgery

## 2016-04-21 ENCOUNTER — Ambulatory Visit (INDEPENDENT_AMBULATORY_CARE_PROVIDER_SITE_OTHER): Payer: Medicare Other

## 2016-04-21 ENCOUNTER — Ambulatory Visit (INDEPENDENT_AMBULATORY_CARE_PROVIDER_SITE_OTHER): Payer: Medicare Other | Admitting: Physician Assistant

## 2016-04-21 DIAGNOSIS — M25572 Pain in left ankle and joints of left foot: Secondary | ICD-10-CM

## 2016-04-21 DIAGNOSIS — M1711 Unilateral primary osteoarthritis, right knee: Secondary | ICD-10-CM

## 2016-04-21 MED ORDER — LIDOCAINE HCL 1 % IJ SOLN
1.0000 mL | INTRAMUSCULAR | Status: AC | PRN
  Administered 2016-04-21: 1 mL

## 2016-04-21 MED ORDER — LIDOCAINE HCL 1 % IJ SOLN
3.0000 mL | INTRAMUSCULAR | Status: AC | PRN
  Administered 2016-04-21: 3 mL

## 2016-04-21 MED ORDER — METHYLPREDNISOLONE ACETATE 40 MG/ML IJ SUSP
40.0000 mg | INTRAMUSCULAR | Status: AC | PRN
  Administered 2016-04-21: 40 mg via INTRA_ARTICULAR

## 2016-04-21 NOTE — Progress Notes (Signed)
Office Visit Note   Patient: Katie [name]           Date of Birth: Jan 11, 1941           MRN: [id number] Visit Date: 04/21/2016              Requested by: [name], MD [address] [place], [place] [address] PCP: [name], MD   Assessment & Plan: Visit Diagnoses:  1. Pain in left ankle and joints of left foot     Plan: We will check on gaining approval for Monocryl this injection for her right knee and call her when this is available  Follow-Up Instructions: Return for Supplemental injection.   Orders:  Orders Placed This Encounter  Procedures  . Large Joint Injection/Arthrocentesis  . Medium Joint Injection/Arthrocentesis  . XR Ankle Complete Left   No orders of the defined types were placed in this encounter.     Procedures: Large Joint Inj Date/Time: 04/21/2016 11:26 AM Performed by: Pete Pelt Authorized by: Pete Pelt   Consent Given by:  Patient Indications:  Pain Location:  Knee Site:  R knee Needle Size:  22 G Approach:  Anterolateral Ultrasound Guidance: No   Fluoroscopic Guidance: No   Medications:  40 mg methylPREDNISolone acetate 40 MG/ML; 3 mL lidocaine 1 % Aspiration Attempted: No   Patient tolerance:  Patient tolerated the procedure well with no immediate complications Medium Joint Inj Date/Time: 04/21/2016 11:27 AM Performed by: Pete Pelt Authorized by: Pete Pelt   Consent Given by:  Patient Indications:  Pain Location:  Ankle Site:  L ankle Needle Size:  22 G Approach:  Anterolateral Ultrasound Guided: No   Fluoroscopic Guidance: No   Medications:  1 mL lidocaine 1 %; 40 mg methylPREDNISolone acetate 40 MG/ML Aspiration Attempted: No       Clinical Data: No additional findings.   Subjective: Chief Complaint  Patient presents with  . Right Knee - Pain    Right knee pain, history of monovisc injections. Last done 10/15/15, questions if she can repeat this.  . Left  Ankle - Pain    Patient stepped down into a hole ~1wk ago. Right ankle pain lateral and dorsally. Swelling, she is in ankle brace.     HPI This Spacek comes in today complaining of left ankle pain in right knee pain. She's having radicular monitor this injection in the right knee glasses given injection with Monocryl this in the right knee back in August 2017 and a cortisone injection in the left ankle. She wears an ankle brace on the left ankle. History of left ankle fracture that was treated conservatively in 1966. Review of Systems   Objective: Vital Signs: There were no vitals taken for this visit.  Physical Exam  Constitutional: She is oriented to person, place, and time. She appears well-developed and well-nourished. No distress.  Neurological: She is alert and oriented to person, place, and time.  Psychiatric: She has a normal mood and affect.    Ortho Exam left ankle is tenderness over the anterior aspect of the ankle. Limited dorsiflexion plantar flexion. Good inversion and eversion. No tenderness over the medial malleolus lateral malleolus. Slight edema no abnormal warmth erythema or impending ulcers. Right knee good range of motion. No effusion no abnormal warmth no erythema and   Specialty Comments:  No specialty comments available.  Imaging: Xr Ankle Complete Left  Result Date: 04/21/2016 3Views left ankle: No acute fracture well-healed distal fibula  and distal tibia fracture that appears to have been intra-articular. Talus well located within the ankle mortise.Moderate to severe arthritic changes left ankle    PMFS History: Patient Active Problem List   Diagnosis Date Noted  . Hypotension 02/03/2016  . Chest pain on breathing 02/03/2016  . AKI (acute kidney injury) ([place]) 02/03/2016  . SOB (shortness of breath) 01/08/2016  . CAP (community acquired pneumonia) due to Haemophilus influenzae ([place]) 01/07/2016  . Acute diastolic congestive heart failure ([place]) 12/25/2015    . Sepsis due to pneumonia ([place])   . Atrial fibrillation with rapid ventricular response ([place]) 12/23/2015  . Other abnormal glucose 09/19/2015  . AAA (abdominal aortic aneurysm) without rupture ([place]) 04/22/2015  . Medicare annual wellness visit, initial 11/13/2014  . Morbid obesity due to excess calories ([place]) 08/03/2014  . PAD (peripheral artery disease) ([place]) 04/16/2014  . PVD (peripheral vascular disease) with claudication ([place]) 10/16/2013  . CKD  stage III (GFR 51 ml/min) 06/08/2013  . Hyperlipidemia 06/08/2013  . Medication management 06/08/2013  . 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 Long term current use of anticoagulant therapy 04/23/2013  . AAA (abdominal aortic aneurysm) ([place]) 04/11/2013  . Vitamin D deficiency 02/15/2013  . Cervical dysplasia   . Hypothyroidism   . Osteopenia   . Congestive heart failure ([place]) 11/25/2008  . Migraine headache 11/22/2008  . Essential hypertension 11/22/2008  . Coronary atherosclerosis 11/22/2008  . Atrial fibrillation ([place]) 11/22/2008  . COPD (chronic obstructive pulmonary disease) with chronic bronchitis ([place]) 11/22/2008  . GERD 11/22/2008   Past Medical History:  Diagnosis Date  . Aortic stenosis    mild AS witm mild to mod AR by 11/2012 echo  . Arthritis   . Atrial fibrillation ([place])   . Atrial flutter ([place])   . Bronchitis, chronic ([place])   . Cervical dysplasia   . Coronary artery disease   . Cystocele    either on bladder or kidneys patient is unsure but stated physician will watch it  . Dizziness    If patient gets out of bed too fast  . Family history of anesthesia complication    Hard for daughter to wake up after Anesthesia  . Gout   . H/O hiatal hernia   . History of Bell's palsy   . Migraine headache   . Osteopenia   . Rosacea conjunctivitis    Right eye is worse than left eye  . Seasonal allergies   . Shortness of breath    with exertion  . Thyroid disease    Hypo  . Tobacco abuse   .  Unspecified vitamin D deficiency     Family History  Problem Relation Age of Onset  . Cirrhosis Mother   . Cancer Mother 33    PANCREAS  . Heart defect Sister   . Breast cancer Sister     age 47  . Heart disease Sister   . Stroke Sister   . Alcohol abuse Father   . Depression Father   . Hypertension Brother   . Hyperlipidemia Son   . Heart disease Daughter     Past Surgical History:  Procedure Laterality Date  . ABDOMINAL AORTIC ENDOVASCULAR STENT GRAFT N/A 04/11/2013   Procedure: ABDOMINAL AORTIC ENDOVASCULAR STENT GRAFT WITH RIGHT FEMORAL PATCH ANGIOPLASTY;  Surgeon: Mal Misty, MD;  Location: [facility];  Service: Vascular;  Laterality: N/A;  . BREAST SURGERY     LEFT BREAST BIOPSY  . broken leg Left 1970s  . CARDIAC CATHETERIZATION    .  CHOLECYSTECTOMY  1972  . COLONOSCOPY    . EYE SURGERY Bilateral   . Laser vein procedure    . REFRACTIVE SURGERY    . TONSILLECTOMY     Social History   Occupational History  . Not on file.   Social History Main Topics  . Smoking status: Former Smoker    Types: Cigarettes    Quit date: 08/10/2002  . Smokeless tobacco: Never Used     Comment: History of tobacco abuse  . Alcohol use 0.0 oz/week     Comment: rare  . Drug use: No  . Sexual activity: Yes    Birth control/ protection: Post-menopausal

## 2016-04-22 ENCOUNTER — Other Ambulatory Visit: Payer: Self-pay | Admitting: Internal Medicine

## 2016-04-27 ENCOUNTER — Telehealth (INDEPENDENT_AMBULATORY_CARE_PROVIDER_SITE_OTHER): Payer: Self-pay | Admitting: Orthopaedic Surgery

## 2016-04-27 NOTE — Telephone Encounter (Signed)
Called patient left message on voicemail to return call to schedule appointment  [phone]

## 2016-04-30 NOTE — Addendum Note (Signed)
Addended by: Lianne Cure A on: 04/30/2016 11:16 AM   Modules accepted: Orders

## 2016-05-01 ENCOUNTER — Other Ambulatory Visit: Payer: Self-pay | Admitting: Internal Medicine

## 2016-05-06 ENCOUNTER — Ambulatory Visit: Payer: Self-pay | Admitting: Physician Assistant

## 2016-05-07 ENCOUNTER — Other Ambulatory Visit: Payer: Self-pay | Admitting: Physician Assistant

## 2016-05-12 ENCOUNTER — Other Ambulatory Visit: Payer: Self-pay | Admitting: Internal Medicine

## 2016-05-12 DIAGNOSIS — H11121 Conjunctival concretions, right eye: Secondary | ICD-10-CM | POA: Diagnosis not present

## 2016-05-12 DIAGNOSIS — H1131 Conjunctival hemorrhage, right eye: Secondary | ICD-10-CM | POA: Diagnosis not present

## 2016-05-12 DIAGNOSIS — Z961 Presence of intraocular lens: Secondary | ICD-10-CM | POA: Diagnosis not present

## 2016-05-13 ENCOUNTER — Ambulatory Visit (INDEPENDENT_AMBULATORY_CARE_PROVIDER_SITE_OTHER): Payer: Medicare Other | Admitting: Physician Assistant

## 2016-05-13 ENCOUNTER — Other Ambulatory Visit: Payer: Self-pay | Admitting: *Deleted

## 2016-05-13 ENCOUNTER — Encounter: Payer: Self-pay | Admitting: Physician Assistant

## 2016-05-13 VITALS — BP 126/82 | HR 68 | Temp 97.3°F | Resp 14 | Ht 63.0 in | Wt 205.6 lb

## 2016-05-13 DIAGNOSIS — I714 Abdominal aortic aneurysm, without rupture, unspecified: Secondary | ICD-10-CM

## 2016-05-13 DIAGNOSIS — I5020 Unspecified systolic (congestive) heart failure: Secondary | ICD-10-CM | POA: Diagnosis not present

## 2016-05-13 DIAGNOSIS — I482 Chronic atrial fibrillation, unspecified: Secondary | ICD-10-CM

## 2016-05-13 DIAGNOSIS — H6123 Impacted cerumen, bilateral: Secondary | ICD-10-CM | POA: Diagnosis not present

## 2016-05-13 DIAGNOSIS — I739 Peripheral vascular disease, unspecified: Secondary | ICD-10-CM

## 2016-05-13 DIAGNOSIS — J4489 Other specified chronic obstructive pulmonary disease: Secondary | ICD-10-CM

## 2016-05-13 DIAGNOSIS — J449 Chronic obstructive pulmonary disease, unspecified: Secondary | ICD-10-CM

## 2016-05-13 DIAGNOSIS — H9203 Otalgia, bilateral: Secondary | ICD-10-CM

## 2016-05-13 LAB — PROTIME-INR
INR: 2.3 — AB
PROTHROMBIN TIME: 23.6 s — AB (ref 9.0–11.5)

## 2016-05-13 MED ORDER — BUPROPION HCL ER (XL) 150 MG PO TB24: 150.0000 mg | ORAL_TABLET | ORAL | 2 refills | Status: DC

## 2016-05-13 MED ORDER — FLUTICASONE-UMECLIDIN-VILANT 100-62.5-25 MCG/INH IN AEPB: 1.0000 | INHALATION_SPRAY | Freq: Every day | RESPIRATORY_TRACT | 3 refills | Status: DC

## 2016-05-13 NOTE — Patient Instructions (Addendum)
Stop peanut butter Eat protein breakfast, eggs or triple zero oikos yogurt.    Simple math prevails.    1st - exercise does not produce significant weight loss - at best one converts fat into muscle , "bulks up", loses inches, but usually stays "weight neutral"     2nd - think of your body weightas a check book: If you eat more calories than you burn up - you save money or gain weight .... Or if you spend more money than you put in the check book, ie burn up more calories than you eat, then you lose weight     3rd - if you walk or run 1 mile, you burn up 100 calories - you have to burn up 3,500 calories to lose 1 pound, ie you have to walk/run 35 miles to lose 1 measly pound. So if you want to lose 10 #, then you have to walk/run 350 miles, so.... clearly exercise is not the solution.     4. So if you consume 1,500 calories, then you have to burn up the equivalent of 15 miles to stay weight neutral - It also stands to reason that if you consume 1,500 cal/day and don't lose weight, then you must be burning up about 1,500 cals/day to stay weight neutral.     5. If you really want to lose weight, you must cut your calorie intake 300 calories /day and at that rate you should lose about 1 # every 3 days.   6. Please purchase Dr Fara Olden Fuhrman's book(s) "The End of Dieting" & "Eat to Live" . It has some great concepts and recipes.

## 2016-05-13 NOTE — Progress Notes (Signed)
Subjective:    Patient ID: Katie [name], female    DOB: 08/14/1940, 76 y.o.   MRN: [id number]  HPI 76 y.o. WF with history of recurrent sinusitis presents for INR check.  She has a history of COPD, she states her nebulizer has stopped working, needs new RX, given samples of trelegy and states that it did help, would like RX.  She is on coumadin for cAfib, will recheck inr, was changed to 2mg  daily and also has a history of PAD denies claudication, and has history of AAA, follows with vascular.  Lab Results  Component Value Date   INR 1.3 (H) 04/12/2016   INR 1.9 (H) 03/01/2016   INR 2.6 (H) 02/10/2016   She also has history of CHF, weight is stable.  BMI is Body mass index is 36.42 kg/m., she is working on diet and exercise. Wt Readings from Last 3 Encounters:  05/13/16 205 lb 9.6 oz (93.3 kg)  04/20/16 200 lb (90.7 kg)  04/12/16 205 lb (93 kg)     Blood pressure 126/82, pulse 68, temperature 97.3 F (36.3 C), resp. rate 14, height 5\' 3"  (1.6 m), weight 205 lb 9.6 oz (93.3 kg), SpO2 97 %.  Current Outpatient Prescriptions on File Prior to Visit  Medication Sig Dispense Refill  . acetaminophen (TYLENOL) 500 MG tablet Take 1,000 mg by mouth 2 (two) times daily as needed for moderate pain.    Marland Kitchen albuterol (PROVENTIL HFA;VENTOLIN HFA) 108 (90 BASE) MCG/ACT inhaler 2 puffs 5 minutes apart every 4 to 6 hours as needed to rescue asthma (Patient taking differently: Inhale 2 puffs into the lungs every 4 (four) hours as needed for shortness of breath. Inhale 2 puffs into the lungs 5 minutes apart every 4 to 6 hours as needed to rescue asthma) 1 Inhaler 11  . albuterol (PROVENTIL) (2.5 MG/3ML) 0.083% nebulizer solution Take 3 mLs (2.5 mg total) by nebulization every 6 (six) hours as needed for wheezing or shortness of breath. 30 vial 0  . allopurinol (ZYLOPRIM) 300 MG tablet TAKE 1 TABLET BY MOUTH EVERY DAY 90 tablet 1  . aspirin 81 MG tablet Take 81 mg by mouth daily.      Marland Kitchen azelastine  (ASTELIN) 0.1 % nasal spray Place 2 sprays into both nostrils 2 (two) times daily. Use in each nostril as directed (Patient taking differently: Place 2 sprays into both nostrils at bedtime. Use in each nostril as directed) 30 mL 2  . cetirizine (ZYRTEC) 10 MG tablet TAKE 1 CAPSULE (10 MG TOTAL) BY MOUTH AT BEDTIME. 90 tablet 3  . digoxin (LANOXIN) 0.25 MG tablet TAKE 1 TABLET BY MOUTH DAILY 90 tablet 1  . diltiazem (CARDIZEM CD) 120 MG 24 hr capsule Take 1 capsule (120 mg total) by mouth daily. 90 capsule 1  . erythromycin ophthalmic ointment Place 1 application into both eyes at bedtime.    . Fluticasone-Umeclidin-Vilant (TRELEGY ELLIPTA) 100-62.5-25 MCG/INH AEPB Inhale 1 puff into the lungs daily. 14 each 0  . furosemide (LASIX) 80 MG tablet     . gabapentin (NEURONTIN) 300 MG capsule TAKE ONE CAPSULE BY MOUTH 3 TIMES A DAY 270 capsule 0  . hydroxypropyl methylcellulose / hypromellose (ISOPTO TEARS / GONIOVISC) 2.5 % ophthalmic solution Place 1 drop into both eyes 3 (three) times daily.    Marland Kitchen ipratropium (ATROVENT) 0.02 % nebulizer solution Take 2.5 mLs (0.5 mg total) by nebulization every 6 (six) hours as needed for wheezing or shortness of breath. 500 mL 1  .  levalbuterol (XOPENEX) 1.25 MG/0.5ML nebulizer solution Take 1.25 mg by nebulization every 6 (six) hours as needed for wheezing or shortness of breath. 1 each 3  . levothyroxine (SYNTHROID, LEVOTHROID) 50 MCG tablet TAKE 1 TABLET EVERY DAY 90 tablet 2  . meclizine (ANTIVERT) 25 MG tablet Take 1 tablet (25 mg total) by mouth 3 (three) times daily as needed for dizziness. 60 tablet 1  . montelukast (SINGULAIR) 10 MG tablet TAKE 1 TABLET EVERY DAY FOR ALLERGIES 90 tablet 1  . potassium chloride SA (KLOR-CON M20) 20 MEQ tablet Take 2 tablets (40 mEq total) by mouth daily. 180 tablet 1  . pravastatin (PRAVACHOL) 40 MG tablet TAKE 1 TABLET BY MOUTH AT BEDTIME FOR CHOLESTEROL 90 tablet 1  . prochlorperazine (COMPAZINE) 5 MG tablet TAKE 1 TABLET BY  MOUTH 3 TIMES A DAY FOR VERTIGO OR NAUSEA 90 tablet 1  . warfarin (COUMADIN) 2 MG tablet TAKE 1 TO 2 TABLETS BY MOUTH DAILY OR AS DIRECTED 180 tablet 0   No current facility-administered medications on file prior to visit.    Past Medical History:  Diagnosis Date  . Aortic stenosis    mild AS witm mild to mod AR by 11/2012 echo  . Arthritis   . Atrial fibrillation ([place])   . Atrial flutter ([place])   . Bronchitis, chronic ([place])   . Cervical dysplasia   . Coronary artery disease   . Cystocele    either on bladder or kidneys patient is unsure but stated physician will watch it  . Dizziness    If patient gets out of bed too fast  . Family history of anesthesia complication    Hard for daughter to wake up after Anesthesia  . Gout   . H/O hiatal hernia   . History of Bell's palsy   . Migraine headache   . Osteopenia   . Rosacea conjunctivitis    Right eye is worse than left eye  . Seasonal allergies   . Shortness of breath    with exertion  . Thyroid disease    Hypo  . Tobacco abuse   . Unspecified vitamin D deficiency     Review of Systems  Constitutional: Positive for fatigue. Negative for activity change, appetite change, chills, diaphoresis, fever and unexpected weight change.  HENT: Positive for ear pain and hearing loss. Negative for congestion and sore throat.   Respiratory: Positive for cough. Negative for chest tightness, shortness of breath and wheezing.   Cardiovascular: Negative for chest pain, palpitations and leg swelling.  Gastrointestinal: Negative.   Genitourinary: Negative.   Musculoskeletal: Negative for back pain.  Skin: Negative.   Neurological: Negative for dizziness, facial asymmetry, light-headedness, numbness and headaches.  Psychiatric/Behavioral: Negative.        Objective:   Physical Exam  Constitutional: She is oriented to person, place, and time. She appears well-developed and well-nourished.  HENT:  Head: Normocephalic and atraumatic.   Right Ear: Decreased hearing is noted.  Left Ear: Decreased hearing is noted.  Nose: Right sinus exhibits no maxillary sinus tenderness and no frontal sinus tenderness. Left sinus exhibits no maxillary sinus tenderness and no frontal sinus tenderness.  Cerumen impaction bilateral ears  Eyes: Conjunctivae and EOM are normal.  Neck: Normal range of motion. Neck supple.  Cardiovascular: Normal rate.  An irregularly irregular rhythm present. Exam reveals decreased pulses.   Murmur heard.  Systolic murmur is present  Pulmonary/Chest: Effort normal. No accessory muscle usage. No respiratory distress. She has decreased breath sounds. She  has no wheezes. She has no rhonchi. She has no rales.  Abdominal: Soft. Bowel sounds are normal.  Musculoskeletal:       Lumbar back: She exhibits normal range of motion, no tenderness, no bony tenderness, no swelling, no edema, no deformity, no laceration, no pain, no spasm and normal pulse.  Lymphadenopathy:    She has no cervical adenopathy.  Neurological: She is alert and oriented to person, place, and time. She has normal strength. No cranial nerve deficit or sensory deficit.  Skin: Skin is warm and dry.       Assessment & Plan:   PAD (peripheral artery disease) (HCC) -     BASIC METABOLIC PANEL WITH GFR - follow up with vascular doctor - Control blood pressure, cholesterol, glucose, increase exercise.   Systolic congestive heart failure, unspecified congestive heart failure chronicity (HCC) -     BASIC METABOLIC PANEL WITH GFR -     losartan (COZAAR) 100 MG tablet; Take 1 tablet (100 mg total) by mouth daily. Continue cardio follow up  Chronic atrial fibrillation (HCC) -     Protime-INR - denies any bleeding/bruising etc.   AAA (abdominal aortic aneurysm) without rupture (HCC) -     Hepatic function panel -Control blood pressure, cholesterol, glucose, increase exercise.   COPD (chronic obstructive pulmonary disease) with chronic bronchitis  (HCC) -     losartan (COZAAR) 100 MG tablet; Take 1 tablet (100 mg total) by mouth daily. -     Tiotropium Bromide Monohydrate (SPIRIVA RESPIMAT) 2.5 MCG/ACT AERS; Use 1 to 2 puff once daily  Morbid obesity, unspecified obesity type ([place])  - long discussion about weight loss, diet, and exercise  - will add on wellbutrin  Cerumen impaction with ear pain Bilateral - stop using Qtips, irrigation used in the office without complications, use OTC drops/oil at home to prevent reoccurence   Future Appointments Date Time Provider [place]  05/26/2016 10:00 AM Mcarthur Rossetti, MD [facility] None  06/09/2016 11:45 AM [name], MD [facility] None  01/06/2017 9:00 AM [name], MD [facility] None  04/26/2017 8:00 AM [facility] 4 [facility]  04/26/2017 9:00 AM [facility] 4 [facility]  04/26/2017 9:45 AM Sharmon Leyden Nickel, NP [facility]

## 2016-05-14 NOTE — Progress Notes (Signed)
Pt aware of lab results & voiced understanding of those results.

## 2016-05-25 ENCOUNTER — Other Ambulatory Visit: Payer: Self-pay | Admitting: Physician Assistant

## 2016-05-25 ENCOUNTER — Other Ambulatory Visit: Payer: Self-pay | Admitting: Internal Medicine

## 2016-05-26 ENCOUNTER — Ambulatory Visit (INDEPENDENT_AMBULATORY_CARE_PROVIDER_SITE_OTHER): Payer: Medicare Other | Admitting: Orthopaedic Surgery

## 2016-05-26 DIAGNOSIS — H01025 Squamous blepharitis left lower eyelid: Secondary | ICD-10-CM | POA: Diagnosis not present

## 2016-05-26 DIAGNOSIS — H01024 Squamous blepharitis left upper eyelid: Secondary | ICD-10-CM | POA: Diagnosis not present

## 2016-05-26 DIAGNOSIS — H01021 Squamous blepharitis right upper eyelid: Secondary | ICD-10-CM | POA: Diagnosis not present

## 2016-05-26 DIAGNOSIS — H10503 Unspecified blepharoconjunctivitis, bilateral: Secondary | ICD-10-CM | POA: Diagnosis not present

## 2016-05-26 DIAGNOSIS — H01022 Squamous blepharitis right lower eyelid: Secondary | ICD-10-CM | POA: Diagnosis not present

## 2016-05-31 ENCOUNTER — Encounter (INDEPENDENT_AMBULATORY_CARE_PROVIDER_SITE_OTHER): Payer: Self-pay | Admitting: Physician Assistant

## 2016-05-31 ENCOUNTER — Ambulatory Visit (INDEPENDENT_AMBULATORY_CARE_PROVIDER_SITE_OTHER): Payer: Medicare Other | Admitting: Physician Assistant

## 2016-05-31 DIAGNOSIS — M1711 Unilateral primary osteoarthritis, right knee: Secondary | ICD-10-CM | POA: Diagnosis not present

## 2016-05-31 MED ORDER — HYALURONAN 88 MG/4ML IX SOSY
88.0000 mg | PREFILLED_SYRINGE | INTRA_ARTICULAR | Status: AC | PRN
  Administered 2016-05-31: 88 mg via INTRA_ARTICULAR

## 2016-05-31 NOTE — Progress Notes (Signed)
Office Visit Note   Patient: Katie [name]           Date of Birth: [birth year]           MRN: [id number] Visit Date: 05/31/2016              Requested by: [name], MD [address] [place], [place] [address] PCP: [name], MD   Assessment & Plan: Visit Diagnoses:  1. Primary osteoarthritis of right knee     Plan: We will see her back in approximately 6 weeks check her progress lack of with the Monovisc  injection. She is activities as tolerated.  Follow-Up Instructions: Return in about 6 weeks (around 07/12/2016).   Orders:  Orders Placed This Encounter  Procedures  . Large Joint Injection/Arthrocentesis   No orders of the defined types were placed in this encounter.     Procedures: Large Joint Inj Date/Time: 05/31/2016 2:32 PM Performed by: Pete Pelt Authorized by: Pete Pelt   Consent Given by:  Patient Indications:  Pain Location:  Knee Site:  R knee Needle Size:  22 G Needle Length:  1.5 inches Approach:  Superolateral Ultrasound Guidance: No   Fluoroscopic Guidance: No   Medications:  88 mg Hyaluronan 88 MG/4ML Aspiration Attempted: No   Patient tolerance:  Patient tolerated the procedure well with no immediate complications     Clinical Data: No additional findings.   Subjective: Chief Complaint  Patient presents with  . Right Knee - Pain    Patient presents for Monovisc injection right knee. She is status post right knee cortisone injection and left ankle cortisone injection on 04/21/2016. She states it helped the knee a little, but her ankle is still bothering her.     Review of Systems   Objective: Vital Signs: There were no vitals taken for this visit.  Physical Exam  Constitutional: She appears well-developed and well-nourished. No distress.    Ortho Exam Right knee no effusion abnormal warmth erythema she has good range of motion of the knee. Specialty Comments:  No specialty  comments available.  Imaging: No results found.   PMFS History: Patient Active Problem List   Diagnosis Date Noted  . Hypotension 02/03/2016  . Chest pain on breathing 02/03/2016  . AKI (acute kidney injury) ([place]) 02/03/2016  . SOB (shortness of breath) 01/08/2016  . CAP (community acquired pneumonia) due to Haemophilus influenzae ([place]) 01/07/2016  . Acute diastolic congestive heart failure ([place]) 12/25/2015  . Sepsis due to pneumonia ([place])   . Atrial fibrillation with rapid ventricular response ([place]) 12/23/2015  . Other abnormal glucose 09/19/2015  . AAA (abdominal aortic aneurysm) without rupture ([place]) 04/22/2015  . Medicare annual wellness visit, initial 11/13/2014  . Morbid obesity due to excess calories ([place]) 08/03/2014  . PAD (peripheral artery disease) ([place]) 04/16/2014  . PVD (peripheral vascular disease) with claudication ([place]) 10/16/2013  . CKD  stage III (GFR 51 ml/min) 06/08/2013  . Hyperlipidemia 06/08/2013  . Medication management 06/08/2013  . 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 Long term current use of anticoagulant therapy 04/23/2013  . AAA (abdominal aortic aneurysm) ([place]) 04/11/2013  . Vitamin D deficiency 02/15/2013  . Cervical dysplasia   . Hypothyroidism   . Osteopenia   . Congestive heart failure ([place]) 11/25/2008  . Migraine headache 11/22/2008  . Essential hypertension 11/22/2008  . Coronary atherosclerosis 11/22/2008  . Atrial fibrillation ([place]) 11/22/2008  . COPD (chronic obstructive pulmonary disease) with chronic bronchitis ([place]) 11/22/2008  .  GERD 11/22/2008   Past Medical History:  Diagnosis Date  . Aortic stenosis    mild AS witm mild to mod AR by 11/2012 echo  . Arthritis   . Atrial fibrillation ([place])   . Atrial flutter ([place])   . Bronchitis, chronic ([place])   . Cervical dysplasia   . Coronary artery disease   . Cystocele    either on bladder or kidneys patient is unsure but stated physician will watch it  . Dizziness     If patient gets out of bed too fast  . Family history of anesthesia complication    Hard for daughter to wake up after Anesthesia  . Gout   . H/O hiatal hernia   . History of Bell's palsy   . Migraine headache   . Osteopenia   . Rosacea conjunctivitis    Right eye is worse than left eye  . Seasonal allergies   . Shortness of breath    with exertion  . Thyroid disease    Hypo  . Tobacco abuse   . Unspecified vitamin D deficiency     Family History  Problem Relation Age of Onset  . Cirrhosis Mother   . Cancer Mother 67    PANCREAS  . Heart defect Sister   . Breast cancer Sister     age 10  . Heart disease Sister   . Stroke Sister   . Alcohol abuse Father   . Depression Father   . Hypertension Brother   . Hyperlipidemia Son   . Heart disease Daughter     Past Surgical History:  Procedure Laterality Date  . ABDOMINAL AORTIC ENDOVASCULAR STENT GRAFT N/A 04/11/2013   Procedure: ABDOMINAL AORTIC ENDOVASCULAR STENT GRAFT WITH RIGHT FEMORAL PATCH ANGIOPLASTY;  Surgeon: Mal Misty, MD;  Location: [facility];  Service: Vascular;  Laterality: N/A;  . BREAST SURGERY     LEFT BREAST BIOPSY  . broken leg Left 1970s  . CARDIAC CATHETERIZATION    . CHOLECYSTECTOMY  1972  . COLONOSCOPY    . EYE SURGERY Bilateral   . Laser vein procedure    . REFRACTIVE SURGERY    . TONSILLECTOMY     Social History   Occupational History  . Not on file.   Social History Main Topics  . Smoking status: Former Smoker    Types: Cigarettes    Quit date: 08/10/2002  . Smokeless tobacco: Never Used     Comment: History of tobacco abuse  . Alcohol use 0.0 oz/week     Comment: rare  . Drug use: No  . Sexual activity: Yes    Birth control/ protection: Post-menopausal

## 2016-06-01 ENCOUNTER — Encounter (HOSPITAL_COMMUNITY): Payer: Self-pay | Admitting: Emergency Medicine

## 2016-06-01 ENCOUNTER — Emergency Department (HOSPITAL_COMMUNITY): Payer: Medicare Other

## 2016-06-01 ENCOUNTER — Emergency Department (HOSPITAL_COMMUNITY)
Admission: EM | Admit: 2016-06-01 | Discharge: 2016-06-02 | Disposition: A | Discharge status: Home / Self Care | Payer: Medicare Other | Attending: Emergency Medicine | Admitting: Emergency Medicine

## 2016-06-01 DIAGNOSIS — I509 Heart failure, unspecified: Secondary | ICD-10-CM | POA: Diagnosis not present

## 2016-06-01 DIAGNOSIS — Z7982 Long term (current) use of aspirin: Secondary | ICD-10-CM | POA: Insufficient documentation

## 2016-06-01 DIAGNOSIS — I130 Hypertensive heart and chronic kidney disease with heart failure and stage 1 through stage 4 chronic kidney disease, or unspecified chronic kidney disease: Secondary | ICD-10-CM | POA: Diagnosis not present

## 2016-06-01 DIAGNOSIS — J449 Chronic obstructive pulmonary disease, unspecified: Secondary | ICD-10-CM | POA: Diagnosis not present

## 2016-06-01 DIAGNOSIS — Z7901 Long term (current) use of anticoagulants: Secondary | ICD-10-CM | POA: Insufficient documentation

## 2016-06-01 DIAGNOSIS — R0602 Shortness of breath: Secondary | ICD-10-CM | POA: Diagnosis not present

## 2016-06-01 DIAGNOSIS — Z79899 Other long term (current) drug therapy: Secondary | ICD-10-CM | POA: Diagnosis not present

## 2016-06-01 DIAGNOSIS — E039 Hypothyroidism, unspecified: Secondary | ICD-10-CM | POA: Insufficient documentation

## 2016-06-01 DIAGNOSIS — Z87891 Personal history of nicotine dependence: Secondary | ICD-10-CM | POA: Diagnosis not present

## 2016-06-01 DIAGNOSIS — N183 Chronic kidney disease, stage 3 (moderate): Secondary | ICD-10-CM | POA: Insufficient documentation

## 2016-06-01 DIAGNOSIS — I2510 Atherosclerotic heart disease of native coronary artery without angina pectoris: Secondary | ICD-10-CM | POA: Insufficient documentation

## 2016-06-01 LAB — CBC
HCT: 48 % — ABNORMAL HIGH (ref 36.0–46.0)
Hemoglobin: 16 g/dL — ABNORMAL HIGH (ref 12.0–15.0)
MCH: 30.9 pg (ref 26.0–34.0)
MCHC: 33.3 g/dL (ref 30.0–36.0)
MCV: 92.8 fL (ref 78.0–100.0)
PLATELETS: 228 10*3/uL (ref 150–400)
RBC: 5.17 MIL/uL — AB (ref 3.87–5.11)
RDW: 14.3 % (ref 11.5–15.5)
WBC: 10 10*3/uL (ref 4.0–10.5)

## 2016-06-01 LAB — I-STAT TROPONIN, ED: TROPONIN I, POC: 0.03 ng/mL (ref 0.00–0.08)

## 2016-06-01 NOTE — ED Triage Notes (Signed)
Pt presents to ER after checking BP at home with automatic reader (180/90); pt states chest pressure x 2 days with SOB

## 2016-06-02 ENCOUNTER — Encounter: Payer: Self-pay | Admitting: Internal Medicine

## 2016-06-02 ENCOUNTER — Encounter (HOSPITAL_COMMUNITY): Payer: Self-pay

## 2016-06-02 ENCOUNTER — Emergency Department (HOSPITAL_COMMUNITY): Payer: Medicare Other

## 2016-06-02 ENCOUNTER — Ambulatory Visit (INDEPENDENT_AMBULATORY_CARE_PROVIDER_SITE_OTHER): Payer: Medicare Other | Admitting: Internal Medicine

## 2016-06-02 VITALS — BP 126/64 | HR 84 | Temp 97.7°F | Resp 18 | Ht 63.0 in | Wt 204.4 lb

## 2016-06-02 DIAGNOSIS — R0602 Shortness of breath: Secondary | ICD-10-CM | POA: Diagnosis not present

## 2016-06-02 DIAGNOSIS — I10 Essential (primary) hypertension: Secondary | ICD-10-CM | POA: Diagnosis not present

## 2016-06-02 DIAGNOSIS — I482 Chronic atrial fibrillation, unspecified: Secondary | ICD-10-CM

## 2016-06-02 LAB — PROTIME-INR
INR: 1.59
Prothrombin Time: 19.1 seconds — ABNORMAL HIGH (ref 11.4–15.2)

## 2016-06-02 LAB — BRAIN NATRIURETIC PEPTIDE: B NATRIURETIC PEPTIDE 5: 130.7 pg/mL — AB (ref 0.0–100.0)

## 2016-06-02 LAB — BASIC METABOLIC PANEL
ANION GAP: 14 (ref 5–15)
BUN: 17 mg/dL (ref 6–20)
CALCIUM: 10.1 mg/dL (ref 8.9–10.3)
CO2: 26 mmol/L (ref 22–32)
Chloride: 99 mmol/L — ABNORMAL LOW (ref 101–111)
Creatinine, Ser: 1.24 mg/dL — ABNORMAL HIGH (ref 0.44–1.00)
GFR, EST AFRICAN AMERICAN: 48 mL/min — AB (ref >60)
GFR, EST NON AFRICAN AMERICAN: 42 mL/min — AB (ref >60)
Glucose, Bld: 89 mg/dL (ref 65–99)
POTASSIUM: 3.8 mmol/L (ref 3.5–5.1)
Sodium: 139 mmol/L (ref 135–145)

## 2016-06-02 LAB — I-STAT TROPONIN, ED: TROPONIN I, POC: 0.03 ng/mL (ref 0.00–0.08)

## 2016-06-02 MED ORDER — IOPAMIDOL (ISOVUE-370) INJECTION 76%
INTRAVENOUS | Status: AC
  Administered 2016-06-02: 100 mL
  Filled 2016-06-02: qty 100

## 2016-06-02 MED ORDER — DILTIAZEM HCL 25 MG/5ML IV SOLN
10.0000 mg | Freq: Once | INTRAVENOUS | Status: AC
  Administered 2016-06-02: 10 mg via INTRAVENOUS
  Filled 2016-06-02: qty 5

## 2016-06-02 MED ORDER — FUROSEMIDE 10 MG/ML IJ SOLN
40.0000 mg | Freq: Once | INTRAMUSCULAR | Status: AC
  Administered 2016-06-02: 40 mg via INTRAVENOUS
  Filled 2016-06-02: qty 4

## 2016-06-02 MED ORDER — DILTIAZEM HCL ER COATED BEADS 240 MG PO CP24
240.0000 mg | ORAL_CAPSULE | Freq: Once | ORAL | Status: AC
  Administered 2016-06-02: 240 mg via ORAL
  Filled 2016-06-02: qty 1

## 2016-06-02 NOTE — ED Provider Notes (Signed)
[facility] DEPT Provider Note   CSN: [id number] Arrival date & time: 06/01/16  2325   By signing my name below, I, Soijett Blue, attest that this documentation has been prepared under the direction and in the presence of Orpah Greek, MD. Electronically Signed: [place], ED Scribe. 06/02/16. 12:30 AM.  History   Chief Complaint Chief Complaint  Patient presents with  . Hypertension  . Chest Pain    HPI Katie [name] is a 76 y.o. female with a PMHx of A-fib, coronary artery dx, SOB, who presents to the Emergency Department complaining of elevated blood pressure onset yesterday. Pt reports associated CP x 2 days ago, SOB worsened with laying flat, and cough. Pt has tried a nebulizer treatment with no relief of her symptoms. She notes that she checked her blood pressure PTA and noted that it was elevated to 185/99. She reports that she has a hx of pneumonia 5 months ago. She denies fever, chills, and any other symptoms.     The history is provided by the patient. No language interpreter was used.    Past Medical History:  Diagnosis Date  . Aortic stenosis    mild AS witm mild to mod AR by 11/2012 echo  . Arthritis   . Atrial fibrillation ()   . Atrial flutter ([place])   . Bronchitis, chronic ([place])   . Cervical dysplasia   . Coronary artery disease   . Cystocele    either on bladder or kidneys patient is unsure but stated physician will watch it  . Dizziness    If patient gets out of bed too fast  . Family history of anesthesia complication    Hard for daughter to wake up after Anesthesia  . Gout   . H/O hiatal hernia   . History of Bell's palsy   . Migraine headache   . Osteopenia   . Rosacea conjunctivitis    Right eye is worse than left eye  . Seasonal allergies   . Shortness of breath    with exertion  . Thyroid disease    Hypo  . Tobacco abuse   . Unspecified vitamin D deficiency     Patient Active Problem List   Diagnosis Date Noted  .  Hypotension 02/03/2016  . Chest pain on breathing 02/03/2016  . AKI (acute kidney injury) ([place]) 02/03/2016  . SOB (shortness of breath) 01/08/2016  . CAP (community acquired pneumonia) due to Haemophilus influenzae ([place]) 01/07/2016  . Acute diastolic congestive heart failure ([place]) 12/25/2015  . Sepsis due to pneumonia ([place])   . Atrial fibrillation with rapid ventricular response ([place]) 12/23/2015  . Other abnormal glucose 09/19/2015  . AAA (abdominal aortic aneurysm) without rupture ([place]) 04/22/2015  . Medicare annual wellness visit, initial 11/13/2014  . Morbid obesity due to excess calories ([place]) 08/03/2014  . PAD (peripheral artery disease) ([place]) 04/16/2014  . PVD (peripheral vascular disease) with claudication ([place]) 10/16/2013  . CKD  stage III (GFR 51 ml/min) 06/08/2013  . Hyperlipidemia 06/08/2013  . Medication management 06/08/2013  . 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 Long term current use of anticoagulant therapy 04/23/2013  . AAA (abdominal aortic aneurysm) ([place]) 04/11/2013  . Vitamin D deficiency 02/15/2013  . Cervical dysplasia   . Hypothyroidism   . Osteopenia   . Congestive heart failure ([place]) 11/25/2008  . Migraine headache 11/22/2008  . Essential hypertension 11/22/2008  . Coronary atherosclerosis 11/22/2008  . Atrial fibrillation ([place]) 11/22/2008  . COPD (chronic obstructive pulmonary  disease) with chronic bronchitis ([place]) 11/22/2008  . GERD 11/22/2008    Past Surgical History:  Procedure Laterality Date  . ABDOMINAL AORTIC ENDOVASCULAR STENT GRAFT N/A 04/11/2013   Procedure: ABDOMINAL AORTIC ENDOVASCULAR STENT GRAFT WITH RIGHT FEMORAL PATCH ANGIOPLASTY;  Surgeon: Mal Misty, MD;  Location: [facility];  Service: Vascular;  Laterality: N/A;  . BREAST SURGERY     LEFT BREAST BIOPSY  . broken leg Left 1970s  . CARDIAC CATHETERIZATION    . CHOLECYSTECTOMY  1972  . COLONOSCOPY    . EYE SURGERY Bilateral   . Laser vein procedure    .  REFRACTIVE SURGERY    . TONSILLECTOMY      OB History    Gravida Para Term Preterm AB Living   4 3 3   1 3    SAB TAB Ectopic Multiple Live Births                   Home Medications    Prior to Admission medications   Medication Sig Start Date End Date Taking? Authorizing Provider  acetaminophen (TYLENOL) 500 MG tablet Take 1,000 mg by mouth 2 (two) times daily as needed for moderate pain.   Yes Historical Provider, MD  albuterol (PROVENTIL HFA;VENTOLIN HFA) 108 (90 BASE) MCG/ACT inhaler 2 puffs 5 minutes apart every 4 to 6 hours as needed to rescue asthma Patient taking differently: Inhale 2 puffs into the lungs every 4 (four) hours as needed for shortness of breath. Inhale 2 puffs into the lungs 5 minutes apart every 4 to 6 hours as needed to rescue asthma 08/13/13 06/02/16 Yes Melissa Smith, PA-C  albuterol (PROVENTIL) (2.5 MG/3ML) 0.083% nebulizer solution Take 3 mLs (2.5 mg total) by nebulization every 6 (six) hours as needed for wheezing or shortness of breath. 04/12/16  Yes Courtney Forcucci, PA-C  allopurinol (ZYLOPRIM) 300 MG tablet TAKE 1 TABLET BY MOUTH EVERY DAY 05/25/16  Yes [name], MD  aspirin 81 MG tablet Take 81 mg by mouth daily.     Yes Historical Provider, MD  azelastine (ASTELIN) 0.1 % nasal spray Place 2 sprays into both nostrils 2 (two) times daily. Use in each nostril as directed Patient taking differently: Place 2 sprays into both nostrils at bedtime as needed for rhinitis or allergies. Use in each nostril as directed 09/04/15  Yes Vicie Mutters, PA-C  azithromycin (ZITHROMAX) 250 MG tablet Take 250 mg by mouth daily. 05/26/16  Yes Historical Provider, MD  buPROPion (WELLBUTRIN XL) 150 MG 24 hr tablet Take 1 tablet (150 mg total) by mouth every morning. 05/13/16 05/13/17 Yes Vicie Mutters, PA-C  cetirizine (ZYRTEC) 10 MG tablet TAKE 1 CAPSULE (10 MG TOTAL) BY MOUTH AT BEDTIME. 05/02/16  Yes [name], MD  digoxin (LANOXIN) 0.25 MG tablet TAKE 1 TABLET BY MOUTH  DAILY Patient taking differently: TAKE 1/2 TABLET BY MOUTH DAILY 01/23/16  Yes [name], MD  diltiazem (CARDIZEM CD) 120 MG 24 hr capsule Take 1 capsule (120 mg total) by mouth daily. 03/17/16  Yes [name], MD  Fluticasone-Umeclidin-Vilant (TRELEGY ELLIPTA) 100-62.5-25 MCG/INH AEPB Inhale 1 puff into the lungs daily. 05/13/16  Yes Vicie Mutters, PA-C  furosemide (LASIX) 80 MG tablet TAKE 1 TABLET BY MOUTH TWICE A DAY AS NEEDED FOR FLUID Patient taking differently: TAKE 1/2 TABLET BY MOUTH DAILY. 05/25/16  Yes [name], MD  gabapentin (NEURONTIN) 300 MG capsule TAKE ONE CAPSULE BY MOUTH 3 TIMES A DAY 05/07/16  Yes Vicie Mutters, PA-C  hydroxypropyl methylcellulose / hypromellose (  ISOPTO TEARS / GONIOVISC) 2.5 % ophthalmic solution Place 1 drop into both eyes 4 (four) times daily as needed for dry eyes.    Yes Historical Provider, MD  ipratropium (ATROVENT) 0.02 % nebulizer solution Take 2.5 mLs (0.5 mg total) by nebulization every 6 (six) hours as needed for wheezing or shortness of breath. 12/30/15  Yes Theodis Blaze, MD  levalbuterol (XOPENEX) 1.25 MG/0.5ML nebulizer solution Take 1.25 mg by nebulization every 6 (six) hours as needed for wheezing or shortness of breath. 12/30/15  Yes Theodis Blaze, MD  levothyroxine (SYNTHROID, LEVOTHROID) 50 MCG tablet TAKE 1 TABLET EVERY DAY Patient taking differently: TAKE 1/2 TABLET EVERY DAY 03/24/16  Yes [name], MD  meclizine (ANTIVERT) 25 MG tablet Take 1 tablet (25 mg total) by mouth 3 (three) times daily as needed for dizziness. 04/18/15  Yes Courtney Forcucci, PA-C  montelukast (SINGULAIR) 10 MG tablet TAKE 1 TABLET EVERY DAY FOR ALLERGIES 05/12/16  Yes [name], MD  Olopatadine HCl 0.2 % SOLN INSTILL 1 DROP INTO BOTH EYES TWICE DAILY 05/26/16  Yes Historical Provider, MD  potassium chloride SA (KLOR-CON M20) 20 MEQ tablet Take 2 tablets (40 mEq total) by mouth daily. 04/20/16  Yes Vicie Mutters, PA-C  pravastatin (PRAVACHOL) 40  MG tablet TAKE 1 TABLET BY MOUTH AT BEDTIME FOR CHOLESTEROL Patient taking differently: TAKE 1/2 TABLET BY MOUTH AT BEDTIME FOR CHOLESTEROL 01/05/16  Yes [name], MD  prednisoLONE acetate (PRED FORTE) 1 % ophthalmic suspension INSTILL 1 DROP INTO RIGHT EYE 4 TIMES A DAY 05/12/16  Yes Historical Provider, MD  prochlorperazine (COMPAZINE) 5 MG tablet TAKE 1 TABLET BY MOUTH 3 TIMES A DAY FOR VERTIGO OR NAUSEA 09/16/15  Yes [name], MD  warfarin (COUMADIN) 2 MG tablet TAKE 1 TO 2 TABLETS BY MOUTH DAILY OR AS DIRECTED Patient taking differently: Take 1 tablet daily. 04/21/16  Yes Vicie Mutters, PA-C    Family History Family History  Problem Relation Age of Onset  . Cirrhosis Mother   . Cancer Mother 46    PANCREAS  . Heart defect Sister   . Breast cancer Sister     age 77  . Heart disease Sister   . Stroke Sister   . Alcohol abuse Father   . Depression Father   . Hypertension Brother   . Hyperlipidemia Son   . Heart disease Daughter     Social History Social History  Substance Use Topics  . Smoking status: Former Smoker    Types: Cigarettes    Quit date: 08/10/2002  . Smokeless tobacco: Never Used     Comment: History of tobacco abuse  . Alcohol use 0.0 oz/week     Comment: rare     Allergies   Amiodarone; Diovan [valsartan]; Doxycycline; Flexeril [cyclobenzaprine]; Keflex [cephalexin]; Verapamil; Advair diskus [fluticasone-salmeterol]; and Codeine   Review of Systems Review of Systems  Constitutional: Negative for chills and fever.  Respiratory: Positive for cough and shortness of breath.   Cardiovascular: Positive for chest pain.  All other systems reviewed and are negative.    Physical Exam Updated Vital Signs BP (!) 151/92   Pulse 85   Temp 97.6 F (36.4 C) (Oral)   Resp (!) 21   Ht 5' 3.5" (1.613 m)   Wt 198 lb (89.8 kg)   SpO2 94%   BMI 34.52 kg/m   Physical Exam  Constitutional: She is oriented to person, place, and time. She appears  well-developed and well-nourished. No distress.  HENT:  Head:  Normocephalic and atraumatic.  Right Ear: Hearing normal.  Left Ear: Hearing normal.  Nose: Nose normal.  Mouth/Throat: Oropharynx is clear and moist and mucous membranes are normal.  Eyes: Conjunctivae and EOM are normal. Pupils are equal, round, and reactive to light.  Neck: Normal range of motion. Neck supple.  Cardiovascular: S1 normal and S2 normal.  A regularly irregular rhythm present. Exam reveals no gallop and no friction rub.   Murmur heard.  Systolic murmur is present with a grade of 2/6  Regularly irregular with 2/6 systolic murmur  Pulmonary/Chest: Effort normal. No respiratory distress. She has decreased breath sounds. She exhibits no tenderness.  Decreased air movement bilaterally.  Abdominal: Soft. Normal appearance and bowel sounds are normal. There is no hepatosplenomegaly. There is no tenderness. There is no rebound, no guarding, no tenderness at McBurney's point and negative Murphy's sign. No hernia.  Musculoskeletal: Normal range of motion.       Right lower leg: She exhibits edema.       Left lower leg: She exhibits edema.  1-2+ pitting edema bilaterally.   Neurological: She is alert and oriented to person, place, and time. She has normal strength. No cranial nerve deficit or sensory deficit. Coordination normal. GCS eye subscore is 4. GCS verbal subscore is 5. GCS motor subscore is 6.  Skin: Skin is warm, dry and intact. No rash noted. No cyanosis.  Psychiatric: She has a normal mood and affect. Her speech is normal and behavior is normal. Thought content normal.  Nursing note and vitals reviewed.   ED Treatments / Results  DIAGNOSTIC STUDIES: Oxygen Saturation is 96% on RA, nl by my interpretation.    COORDINATION OF CARE: 12:29 AM Discussed treatment plan with pt at bedside which includes labs, EKG, CXR, and pt agreed to plan.   Labs (all labs ordered are listed, but only abnormal results are  displayed) Labs Reviewed  BASIC METABOLIC PANEL - Abnormal; Notable for the following:       Result Value   Chloride 99 (*)    Creatinine, Ser 1.24 (*)    GFR calc non Af Amer 42 (*)    GFR calc Af Amer 48 (*)    All other components within normal limits  CBC - Abnormal; Notable for the following:    RBC 5.17 (*)    Hemoglobin 16.0 (*)    HCT 48.0 (*)    All other components within normal limits  PROTIME-INR - Abnormal; Notable for the following:    Prothrombin Time 19.1 (*)    All other components within normal limits  BRAIN NATRIURETIC PEPTIDE - Abnormal; Notable for the following:    B Natriuretic Peptide 130.7 (*)    All other components within normal limits  I-STAT TROPOININ, ED  I-STAT TROPOININ, ED    EKG  EKG Interpretation  Date/Time:  Tuesday June 01 2016 23:30:50 EDT Ventricular Rate:  118 PR Interval:    QRS Duration: 132 QT Interval:  348 QTC Calculation: 487 R Axis:   -94 Text Interpretation:  Atrial fibrillation with rapid ventricular response Right superior axis deviation Left bundle branch block Abnormal ECG No significant change since last tracing Confirmed by Mearl Harewood  MD, Ismeal Heider [phone]) on 06/02/2016 12:08:49 AM       Radiology Dg Chest 2 View  Result Date: 06/01/2016 CLINICAL DATA:  Chest pressure shortness of breath EXAM: CHEST  2 VIEW COMPARISON:  02/13/2016 FINDINGS: There is cardiomegaly. No edema. No consolidation or effusion. Atherosclerosis. No pneumothorax. Partially visualized  vascular stent in the upper abdomen. IMPRESSION: Cardiomegaly without overt failure.  No acute infiltrate. Electronically Signed   By: Donavan Foil M.D.   On: 06/01/2016 23:58   Ct Angio Chest Pe W Or Wo Contrast  Result Date: 06/02/2016 CLINICAL DATA:  Chest pain and hypertension.  Shortness of breath. EXAM: CT ANGIOGRAPHY CHEST WITH CONTRAST TECHNIQUE: Multidetector CT imaging of the chest was performed using the standard protocol during bolus administration of  intravenous contrast. Multiplanar CT image reconstructions and MIPs were obtained to evaluate the vascular anatomy. CONTRAST:  100 mL Isovue 370 IV COMPARISON:  Chest radiograph 06/01/2016 Chest CT 12/25/2015 FINDINGS: Cardiovascular: Contrast injection is sufficient to demonstrate satisfactory opacification of the pulmonary arteries to the segmental level. There is no pulmonary embolus. The main pulmonary artery is enlarged, measuring 3.5 cm at the bifurcation. There is no CT evidence of acute right heart strain. There is atherosclerotic calcification of the thoracic aorta. There is a normal 3-vessel arch branching pattern. Heart size is enlarged without a pericardial effusion. Mediastinum/Nodes: Multiple subcentimeter mediastinal lymph nodes. No axillary lymphadenopathy. The visualized thyroid is normal. The thoracic esophagus is patulous. Lungs/Pleura: There are bilateral peripheral reticular opacities. The large area of abnormality in the right lower lobe seen previously is no longer present. No pulmonary nodules or masses. No pleural effusion or pneumothorax. No focal airspace consolidation. No focal pleural abnormality. Upper Abdomen: Contrast bolus timing is not optimized for evaluation of the abdominal organs. Within this limitation, the visualized organs of the upper abdomen are normal. Musculoskeletal: There is a large posterior osteophyte at the T5-T6 level that narrows the spinal canal. Review of the MIP images confirms the above findings. IMPRESSION: 1. No pulmonary embolus. 2. Enlarged main pulmonary artery and reflux of contrast into the IVC and hepatic veins, which may be seen in the setting of pulmonary hypertension. 3. Cardiomegaly and aortic atherosclerosis. 4. Large posterior osteophyte at the T5-T6 level with at least mild narrowing of the spinal canal. 5. Multifocal peripheral reticular pulmonary opacities, improved compared to 12/25/2015, possibly indicating underlying interstitial lung  disease. Electronically Signed   By: Ulyses Jarred M.D.   On: 06/02/2016 05:03    Procedures Procedures (including critical care time)  Medications Ordered in ED Medications  iopamidol (ISOVUE-370) 76 % injection (100 mLs  Contrast Given 06/02/16 0409)  furosemide (LASIX) injection 40 mg (40 mg Intravenous Given 06/02/16 0642)  diltiazem (CARDIZEM) injection 10 mg (10 mg Intravenous Given 06/02/16 0642)  diltiazem (CARDIZEM CD) 24 hr capsule 240 mg (240 mg Oral Given 06/02/16 0715)     Initial Impression / Assessment and Plan / ED Course  I have reviewed the triage vital signs and the nursing notes.  Pertinent labs & imaging results that were available during my care of the patient were reviewed by me and considered in my medical decision making (see chart for details).     Patient presents to the emergency department for evaluation of elevated blood pressure. Patient has noticed that her pressure has been elevated and her heart rate is somewhat elevated. 2 days ago she had chest discomfort, but is not experiencing discomfort currently. She has, however, noticed increased shortness of breath when lying flat. Patient does have a history of congestive heart failure. Patient is currently taking 40 mg of Lasix daily but does tablet when her legs are swollen.  Patient Appears to be volume overloaded on examination. Chest x-ray also suggests volume overload. No evidence of acute coronary syndrome. Patient administered IV Lasix,  diltiazem for her elevated blood pressure and elevated heart rate. She is in chronic atrial fibrillation. INR is subtherapeutic, will need to contact her anticoagulation clinic.  Final Clinical Impressions(s) / ED Diagnoses   Final diagnoses:  Acute on chronic congestive heart failure, unspecified congestive heart failure type [facility])    New Prescriptions New Prescriptions   No medications on file   I personally performed the services described in this documentation,  which was scribed in my presence. The recorded information has been reviewed and is accurate.      Orpah Greek, MD 06/02/16 [phone]

## 2016-06-02 NOTE — ED Notes (Signed)
Patient transported to CT 

## 2016-06-02 NOTE — Progress Notes (Signed)
Subjective:    Patient ID: Katie [name], female    DOB: Jun 22, 1940, 76 y.o.   MRN: [id number]  HPI  This very nice 76 yo MWF w/ HTN, HLD, cAfib/on Coumadin was evaluated and treated  at the ER last night for elevated BP 185/99 and c/o dyspnea and labs found PT?INR 1.59 x  and CT scan Lung showed no PE or CHF and patient was given in lasix and IV & po Diltiazem and released in improve condition. Now she presents for f/u. She denies any exertional CP or DOE and no palpitations.   Medication Sig  . acetaminophen (TYLENOL) 500 MG tablet Take 1,000 mg by mouth 2 (two) times daily as needed for moderate pain.  Marland Kitchen albuterol (PROVENTIL HFA;VENTOLIN HFA) 108 (90 BASE) MCG/ACT inhaler 2 puffs 5 minutes apart every 4 to 6 hours as needed to rescue asthma (Patient taking differently: Inhale 2 puffs into the lungs every 4 (four) hours as needed for shortness of breath. Inhale 2 puffs into the lungs 5 minutes apart every 4 to 6 hours as needed to rescue asthma)  . albuterol (PROVENTIL) (2.5 MG/3ML) 0.083% nebulizer solution Take 3 mLs (2.5 mg total) by nebulization every 6 (six) hours as needed for wheezing or shortness of breath.  . allopurinol (ZYLOPRIM) 300 MG tablet TAKE 1 TABLET BY MOUTH EVERY DAY  . aspirin 81 MG tablet Take 81 mg by mouth daily.    Marland Kitchen azelastine (ASTELIN) 0.1 % nasal spray Place 2 sprays into both nostrils 2 (two) times daily. Use in each nostril as directed (Patient taking differently: Place 2 sprays into both nostrils at bedtime as needed for rhinitis or allergies. Use in each nostril as directed)  . azithromycin (ZITHROMAX) 250 MG tablet Take 250 mg by mouth daily. - on no 5 out of 30 day per her eye dr for occuklar rosacea.  Marland Kitchen buPROPion (WELLBUTRIN XL) 150 MG 24 hr tablet Take 1 tablet (150 mg total) by mouth every morning.  . cetirizine (ZYRTEC) 10 MG tablet TAKE 1 CAPSULE (10 MG TOTAL) BY MOUTH AT BEDTIME.  . digoxin (LANOXIN) 0.25 MG tablet TAKE 1 TABLET BY MOUTH DAILY (Patient  taking differently: TAKE 1/2 TABLET BY MOUTH DAILY)  . diltiazem (CARDIZEM CD) 120 MG 24 hr capsule Take 1 capsule (120 mg total) by mouth daily.  . Fluticasone-Umeclidin-Vilant (TRELEGY ELLIPTA) 100-62.5-25 MCG/INH AEPB Inhale 1 puff into the lungs daily.  . furosemide (LASIX) 80 MG tablet TAKE 1 TABLET BY MOUTH TWICE A DAY AS NEEDED FOR FLUID (Patient taking differently: TAKE 1/2 TABLET BY MOUTH DAILY.)  . gabapentin (NEURONTIN) 300 MG capsule TAKE ONE CAPSULE BY MOUTH 3 TIMES A DAY  . hydroxypropyl methylcellulose / hypromellose (ISOPTO TEARS / GONIOVISC) 2.5 % ophthalmic solution Place 1 drop into both eyes 4 (four) times daily as needed for dry eyes.   Marland Kitchen ipratropium (ATROVENT) 0.02 % nebulizer solution Take 2.5 mLs (0.5 mg total) by nebulization every 6 (six) hours as needed for wheezing or shortness of breath.  . levalbuterol (XOPENEX) 1.25 MG/0.5ML nebulizer solution Take 1.25 mg by nebulization every 6 (six) hours as needed for wheezing or shortness of breath.  . levothyroxine (SYNTHROID, LEVOTHROID) 50 MCG tablet TAKE 1 TABLET EVERY DAY (Patient taking differently: TAKE 1/2 TABLET EVERY DAY)  . meclizine (ANTIVERT) 25 MG tablet Take 1 tablet (25 mg total) by mouth 3 (three) times daily as needed for dizziness.  . montelukast (SINGULAIR) 10 MG tablet TAKE 1 TABLET EVERY DAY FOR ALLERGIES  .  Olopatadine HCl 0.2 % SOLN INSTILL 1 DROP INTO BOTH EYES TWICE DAILY  . potassium chloride SA (KLOR-CON M20) 20 MEQ tablet Take 2 tablets (40 mEq total) by mouth daily.  . pravastatin (PRAVACHOL) 40 MG tablet TAKE 1 TABLET BY MOUTH AT BEDTIME FOR CHOLESTEROL (Patient taking differently: TAKE 1/2 TABLET BY MOUTH AT BEDTIME FOR CHOLESTEROL)  . prednisoLONE acetate (PRED FORTE) 1 % ophthalmic suspension INSTILL 1 DROP INTO RIGHT EYE 4 TIMES A DAY  . prochlorperazine (COMPAZINE) 5 MG tablet TAKE 1 TABLET BY MOUTH 3 TIMES A DAY FOR VERTIGO OR NAUSEA  . warfarin (COUMADIN) 2 MG tablet TAKE 1 TO 2 TABLETS BY  MOUTH DAILY OR AS DIRECTED (Patient taking differently: Take 1 tablet daily.)    Allergies  Allergen Reactions  . Amiodarone Other (See Comments)    PULMONARY TOXICITY  . Diovan [Valsartan] Other (See Comments)    HYPOTENSION  . Doxycycline Diarrhea and Other (See Comments)    VISUAL DISTURBANCE  . Flexeril [Cyclobenzaprine] Other (See Comments)    FATIGUE  . Keflex [Cephalexin] Diarrhea  . Verapamil Other (See Comments)    EDEMA  . Advair Diskus [Fluticasone-Salmeterol] Other (See Comments)    SKIN CHANGES.  Can tolerate low dose of prednisone without complications  . Codeine Other (See Comments)    unknown   Past Medical History:  Diagnosis Date  . Aortic stenosis    mild AS witm mild to mod AR by 11/2012 echo  . Arthritis   . Atrial fibrillation ([place])   . Atrial flutter ([place])   . Bronchitis, chronic ([place])   . Cervical dysplasia   . Coronary artery disease   . Cystocele    either on bladder or kidneys patient is unsure but stated physician will watch it  . Dizziness    If patient gets out of bed too fast  . Family history of anesthesia complication    Hard for daughter to wake up after Anesthesia  . Gout   . H/O hiatal hernia   . History of Bell's palsy   . Migraine headache   . Osteopenia   . Rosacea conjunctivitis    Right eye is worse than left eye  . Seasonal allergies   . Shortness of breath    with exertion  . Thyroid disease    Hypo  . Tobacco abuse   . Unspecified vitamin D deficiency    Past Surgical History:  Procedure Laterality Date  . ABDOMINAL AORTIC ENDOVASCULAR STENT GRAFT N/A 04/11/2013   Procedure: ABDOMINAL AORTIC ENDOVASCULAR STENT GRAFT WITH RIGHT FEMORAL PATCH ANGIOPLASTY;  Surgeon: Mal Misty, MD;  Location: [facility];  Service: Vascular;  Laterality: N/A;  . BREAST SURGERY     LEFT BREAST BIOPSY  . broken leg Left 1970s  . CARDIAC CATHETERIZATION    . CHOLECYSTECTOMY  1972  . COLONOSCOPY    . EYE SURGERY Bilateral   . Laser vein  procedure    . REFRACTIVE SURGERY    . TONSILLECTOMY     Review of Systems  10 point systems review negative except as above.    Objective:   Physical Exam  BP 126/64   Pulse 84   Temp 97.7 F (36.5 C)   Resp 18   Ht 5\' 3"  (1.6 m)   Wt 204 lb 6.4 oz (92.7 kg)   SpO2 97%   BMI 36.21 kg/m   HEENT - Eac's patent. TM's Nl. EOM's full. PERRLA. NasoOroPharynx clear. Neck - supple. Nl Thyroid. Carotids  2+ & No bruits, nodes, JVD Chest - Clear equal BS w/o Rales, rhonchi, wheezes. Cor - HS soft w/ir RR w/gr 2-3 sys blowing parasternal murmur. PP 1(+). (+) 1 edema. Abd - No palpable organomegaly, masses or tenderness. BS nl. MS- FROM w/o deformities. Muscle power, tone and bulk Nl. Gait Nl. Neuro - No obvious Cr N abnormalities. Sensory, motor and Cerebellar functions appear Nl w/o focal abnormalities.    Assessment & Plan:   1. Chronic atrial fibrillation (HCC)  - Advised increase Coumadin 2 mg tab to 1.5 tab = 3 mg x 3 d - MWF and 1 tab = 2 mg other 4 days.   - ROV 2 weeks  to recheck PT/INR and BMET /renal functions  2. Essential hypertension   - Discussed meds and SE's.

## 2016-06-02 NOTE — Patient Instructions (Signed)
Bleeding Precautions When on Anticoagulant Therapy  WHAT IS ANTICOAGULANT THERAPY?  Anticoagulant therapy is taking medicine to prevent or reduce blood clots. It is also called blood thinner therapy. Blood clots that form in your blood vessels can be dangerous. They can break loose and travel to your heart, lungs, or brain. This increases your risk of a heart attack or stroke. Anticoagulant therapy causes blood to clot more slowly.  You may need anticoagulant therapy if you have:   A medical condition that increases the likelihood that blood clots will form.   A heart defect or a problem with heart rhythm.  It is also a common treatment after heart surgery, such as valve replacement.  WHAT ARE COMMON TYPES OF ANTICOAGULANT THERAPY?  Anticoagulant medicine can be injected or taken by mouth.If you need anticoagulant therapy quickly at the hospital, the medicine may be injected under your skin or given through an IV tube. Heparin is a common example of an anticoagulant that you may get at the hospital.  Most anticoagulant therapy is in the form of pills that you take at home every day. These may include:   Aspirin. This common blood thinner works by preventing blood cells (platelets) from sticking together to form a clot. Aspirin is not as strong as anticoagulants that slow down the time that it takes for your body to form a clot.   Clopidogrel. This is a newer type of drug that affects platelets. It is stronger than aspirin.   Warfarin. This is the most common anticoagulant. It changes the way your body uses vitamin K, a vitamin that helps your blood to clot. The risk of bleeding is higher with warfarin than with aspirin. You will need frequent blood tests to make sure you are taking the safest amount.   New anticoagulants. Several new drugs have been approved. They are all taken by mouth. Studies show that these drugs work as well as warfarin. They do not require blood testing. They may cause less bleeding  risk than warfarin.  WHAT DO I NEED TO REMEMBER WHEN TAKING ANTICOAGULANT THERAPY?  Anticoagulant therapy decreases your risk of forming a blood clot, but it increases your risk of bleeding. Work closely with your health care provider to make sure you are taking your medicine safely. These tips can help:   Learn ways to reduce your risk of bleeding.   If you are taking warfarin:  ? Have blood tests as ordered by your health care provider.  ? Do not make any sudden changes to your diet. Vitamin K in your diet can make warfarin less effective.  ? Do not get pregnant. This medicine may cause birth defects.   Take your medicine at the same time every day. If you forget to take your medicine, take it as soon as you remember. If you miss a whole day, do not double your dose of medicine. Take your normal dose and call your health care provider to check in.   Do not stop taking your medicine on your own.   Tell your health care provider before you start taking any new medicine, vitamin, or herbal product. Some of these could interfere with your therapy.   Tell all of your health care providers that you are on anticoagulant therapy.   Do not have surgery, medical procedures, or dental work until you tell your health care provider that you are on anticoagulant therapy.  WHAT CAN AFFECT HOW ANTICOAGULANTS WORK?  Certain foods, vitamins, medicines, supplements, and herbal   medicines change the way that anticoagulant therapy works. They may increase or decrease the effects of your anticoagulant therapy. Either result can be dangerous for you.   Many over-the-counter medicines for pain, colds, or stomach problems interfere with anticoagulant therapy. Take these only as told by your health care provider.   Do not drink alcohol. It can interfere with your medicine and increase your risk of an injury that causes bleeding.   If you are taking warfarin, do not begin eating more foods that contain vitamin K. These include  leafy green vegetables. Ask your health care provider if you should avoid any foods.  WHAT ARE SOME WAYS TO PREVENT BLEEDING?  You can prevent bleeding by taking certain precautions:   Be extra careful when you use knives, scissors, or other sharp objects.   Use an electric razor instead of a blade.   Do not use toothpicks.   Use a soft toothbrush.   Wear shoes that have nonskid soles.   Use bath mats and handrails in your bathroom.   Wear gloves while you do yard work.   Wear a helmet when you ride a bike.   Wear your seat belt.   Prevent falls by removing loose rugs and extension cords from areas where you walk.   Do not play contact sports or participate in other activities that have a high risk of injury.  WHEN SHOULD I CONTACT MY HEALTH CARE PROVIDER?  Call your health care provider if:   You miss a dose of medicine:  ? And you are not sure what to do.  ? For more than one day.   You have:  ? Menstrual bleeding that is heavier than normal.  ? Blood in your urine.  ? A bloody nose or bleeding gums.  ? Easy bruising.  ? Blood in your stool (feces) or have black and tarry stool.  ? Side effects from your medicine.   You feel weak or dizzy.   You become pregnant.  Seek immediate medical care if:   You have bleeding that will not stop.   You have sudden and severe headache or belly pain.   You vomit or you cough up bright red blood.   You have a severe blow to your head.  WHAT ARE SOME QUESTIONS TO ASK MY HEALTH CARE PROVIDER?   What is the best anticoagulant therapy for my condition?   What side effects should I watch for?   When should I take my medicine? What should I do if I forget to take it?   Will I need to have regular blood tests?   Do I need to change my diet? Are there foods or drinks that I should avoid?   What activities are safe for me?   What should I do if I want to get pregnant?  This information is not intended to replace advice given to you by your health care provider.  Make sure you discuss any questions you have with your health care provider.  Document Released: 02/10/2015 Document Reviewed: 02/10/2015  Elsevier Interactive Patient Education  2017 [employer]

## 2016-06-02 NOTE — Discharge Instructions (Signed)
Your INR was 1.59 today. This is low, you will need to contact anticoagulation clinic for further instructions.  Your Lasix dose for the next week. Follow-up with your heart failure or primary care doctor in the office this week or next.

## 2016-06-02 NOTE — ED Notes (Signed)
Pt states she understands instructions. Home stable with husband with steady gait.

## 2016-06-04 ENCOUNTER — Ambulatory Visit: Payer: Self-pay | Admitting: Internal Medicine

## 2016-06-07 ENCOUNTER — Other Ambulatory Visit: Payer: Self-pay | Admitting: Physician Assistant

## 2016-06-08 ENCOUNTER — Ambulatory Visit (INDEPENDENT_AMBULATORY_CARE_PROVIDER_SITE_OTHER): Payer: Medicare Other | Admitting: Internal Medicine

## 2016-06-08 ENCOUNTER — Encounter: Payer: Self-pay | Admitting: Internal Medicine

## 2016-06-08 VITALS — BP 118/72 | HR 92 | Temp 97.3°F | Resp 16 | Ht 63.0 in | Wt 200.2 lb

## 2016-06-08 DIAGNOSIS — I5020 Unspecified systolic (congestive) heart failure: Secondary | ICD-10-CM | POA: Diagnosis not present

## 2016-06-08 DIAGNOSIS — I482 Chronic atrial fibrillation, unspecified: Secondary | ICD-10-CM

## 2016-06-08 DIAGNOSIS — I10 Essential (primary) hypertension: Secondary | ICD-10-CM

## 2016-06-08 NOTE — Progress Notes (Signed)
Subjective:    Patient ID: Katie [name], female    DOB: [birth year], 76 y.o.   MRN: [id number]  HPI  Patient is a very nice 76 yo MWF with  HTN, ASHD/cAfib treated in the ER 3/19 for elevated BP & dyspnea and a w/u only (+) for suspected mild fluid overload and she was treated w/IV lasix and patient improved. Then since seen over the last week, she has taken an 80 mg daily dose of lasix and her weight is down 4 #. Denies any CP, DOE/Orth/PND.Denies cough, congestion or sputum pdn.   Medication Sig  . acetaminophen (TYLENOL) 500 MG tablet Take 1,000 mg by mouth 2 (two) times daily as needed for moderate pain.  Marland Kitchen albuterol (PROVENTIL) (2.5 MG/3ML) 0.083% nebulizer solution Take 3 mLs (2.5 mg total) by nebulization every 6 (six) hours as needed for wheezing or shortness of breath.  . allopurinol (ZYLOPRIM) 300 MG tablet TAKE 1 TABLET BY MOUTH EVERY DAY  . aspirin 81 MG Take 81 mg by mouth daily.    . ASTELIN nasal spray  Place 2 sprays into nostrils  as needed for rhinitis or allergies.   . Azithromycin 250 MG tablet Take 250 mg by mouth daily x 2 more weeks for occular rosacea  . buPROPion-XL 150 MG  Take 1 tablet (150 mg total) by mouth every morning.  . cetirizine  10 MG tablet TAKE 1 CAP AT BEDTIME.  . digoxin  0.25 MG tablet TAKE 1/2 TABLET BY MOUTH DAILY  . diltiazem CD) 120 MG  Take 1 capsule (120 mg total) by mouth daily.  . TRELEGY ELLIPTA  Inhale 1 puff into the lungs daily.  . furosemide  80 MG tablet TAKE 1 TABLET BY MOUTH TWICE A DAY - takes 1 tab daily  . gabapentin ( 300 MG capsule TAKE ONE CAPSULE BY MOUTH 3 TIMES A DAY  . ISOPTO TEARS 2.5 % ophth Place 1 drop into both eyes 4 (four) times daily as needed for dry eyes.   . Ipratropium nebr soln Take 2.5 mLs every 6hrs as needed  . XOPENEX neb soln Take 1.25 mg by neb every 6 hours as needed  . levothyroxine 50 MCG tablet TAKE 1/2 TABEVERY DAY)  . meclizine  25 MG tablet Take 1 tab 3  times daily as needed   . montelukast10 MG  tablet TAKE 1 TABLET EVERY DAY FOR ALLERGIES  . Olopatadine HCl  SOLN INSTILL 1 DROP INTO  EYES TWICE DAILY  . KLOR-CON  20 MEQ  Take 2 tab daily.  . pravastatin 40 MG TAKE 1/2 TAB AT BEDTIME  . PRED FORTE ophth susp INSTILL 1 DROP INTO RIGHT EYE 4 TIMES A DAY  . prochlorperazine  5 MG tab TAKE 1 TABLET BY MOUTH 3 TIMES A DAY FOR VERTIGO OR NAUSEA  . warfarin  2 MG tablet Patient taking 1 tablet daily.)  . albuterol HFA inhaler 2 puffs 5 minutes apart every 4 to 6 hours as needed    Allergies  Allergen Reactions  . Amiodarone Other (See Comments)    PULMONARY TOXICITY  . Diovan [Valsartan] Other (See Comments)    HYPOTENSION  . Doxycycline Diarrhea and Other (See Comments)    VISUAL DISTURBANCE  . Flexeril [Cyclobenzaprine] Other (See Comments)    FATIGUE  . Keflex [Cephalexin] Diarrhea  . Verapamil Other (See Comments)    EDEMA  . Advair Diskus [Fluticasone-Salmeterol] Other (See Comments)    SKIN CHANGES.  Can tolerate low dose of prednisone  without complications  . Codeine Other (See Comments)    unknown   Past Medical History:  Diagnosis Date  . Aortic stenosis    mild AS witm mild to mod AR by 11/2012 echo  . Arthritis   . Atrial fibrillation ([place])   . Atrial flutter ([place])   . Bronchitis, chronic ([place])   . Cervical dysplasia   . Coronary artery disease   . Cystocele    either on bladder or kidneys patient is unsure but stated physician will watch it  . Dizziness    If patient gets out of bed too fast  . Family history of anesthesia complication    Hard for daughter to wake up after Anesthesia  . Gout   . H/O hiatal hernia   . History of Bell's palsy   . Migraine headache   . Osteopenia   . Rosacea conjunctivitis    Right eye is worse than left eye  . Seasonal allergies   . Shortness of breath    with exertion  . Thyroid disease    Hypo  . Tobacco abuse   . Unspecified vitamin D deficiency     Past Surgical History:  Procedure Laterality Date  .  ABDOMINAL AORTIC ENDOVASCULAR STENT GRAFT N/A 04/11/2013   Procedure: ABDOMINAL AORTIC ENDOVASCULAR STENT GRAFT WITH RIGHT FEMORAL PATCH ANGIOPLASTY;  Surgeon: Mal Misty, MD;  Location: [facility];  Service: Vascular;  Laterality: N/A;  . BREAST SURGERY     LEFT BREAST BIOPSY  . broken leg Left 1970s  . CARDIAC CATHETERIZATION    . CHOLECYSTECTOMY  1972  . COLONOSCOPY    . EYE SURGERY Bilateral   . Laser vein procedure    . REFRACTIVE SURGERY    . TONSILLECTOMY     Review of Systems  10 point systems review negative except as above.     Objective:   Physical Exam  BP 118/72   Pulse 92   Temp 97.3 F (36.3 C)   Resp 16   Ht 5\' 3"  (1.6 m)   Wt 200 lb 3.2 oz (90.8 kg)   BMI 35.46 kg/m   HEENT - Eac's patent. TM's Nl. EOM's full. PERRLA. NasoOroPharynx clear. Neck - supple. Nl Thyroid. Carotids 2+ & No bruits, nodes, JVD Chest - Clear equal BS w/o Rales, rhonchi, wheezes. Cor - soft HS. Sl irRR w/o sig M.. PP 1(+). 1-2 (+) pretibial edema edema. MS- FROM w/o deformities. . Gait Nl. Neuro - Nl w/o focal abnormalities.    Assessment & Plan:   1. Essential hypertension  2. Chronic atrial fibrillation (HCC)  3. Systolic congestive heart failure, unspecified congestive heart failure chronicity ([place])  - advised increase Lasix 80 mg to bid til upcoming OV in ~ 1week   - Discussed Dash diet, meds/SE's and gentle exercise.

## 2016-06-09 ENCOUNTER — Ambulatory Visit: Payer: Self-pay | Admitting: Internal Medicine

## 2016-06-09 ENCOUNTER — Telehealth: Payer: Self-pay | Admitting: *Deleted

## 2016-06-09 NOTE — Telephone Encounter (Signed)
Patient called and asked how much her fluid intake should be while taking Lasix 80 mg 2 times daily.  Per Dr Melford Aase, drink 4 to 5 bottles of water daily.  Patient is aware.

## 2016-06-16 ENCOUNTER — Ambulatory Visit (INDEPENDENT_AMBULATORY_CARE_PROVIDER_SITE_OTHER): Payer: Medicare Other | Admitting: Internal Medicine

## 2016-06-16 ENCOUNTER — Encounter: Payer: Self-pay | Admitting: Internal Medicine

## 2016-06-16 VITALS — BP 130/76 | HR 68 | Temp 97.0°F | Resp 16 | Ht 63.0 in | Wt 202.4 lb

## 2016-06-16 DIAGNOSIS — M1000 Idiopathic gout, unspecified site: Secondary | ICD-10-CM

## 2016-06-16 DIAGNOSIS — R7303 Prediabetes: Secondary | ICD-10-CM

## 2016-06-16 DIAGNOSIS — I10 Essential (primary) hypertension: Secondary | ICD-10-CM

## 2016-06-16 DIAGNOSIS — E782 Mixed hyperlipidemia: Secondary | ICD-10-CM

## 2016-06-16 DIAGNOSIS — J4540 Moderate persistent asthma, uncomplicated: Secondary | ICD-10-CM

## 2016-06-16 DIAGNOSIS — I482 Chronic atrial fibrillation, unspecified: Secondary | ICD-10-CM

## 2016-06-16 DIAGNOSIS — Z79899 Other long term (current) drug therapy: Secondary | ICD-10-CM | POA: Diagnosis not present

## 2016-06-16 DIAGNOSIS — Z7901 Long term (current) use of anticoagulants: Secondary | ICD-10-CM

## 2016-06-16 DIAGNOSIS — E559 Vitamin D deficiency, unspecified: Secondary | ICD-10-CM

## 2016-06-16 LAB — CBC WITH DIFFERENTIAL/PLATELET
BASOS PCT: 0 %
Basophils Absolute: 0 cells/uL (ref 0–200)
EOS PCT: 3 %
Eosinophils Absolute: 249 cells/uL (ref 15–500)
HCT: 46.7 % — ABNORMAL HIGH (ref 35.0–45.0)
Hemoglobin: 15.5 g/dL (ref 11.7–15.5)
LYMPHS PCT: 25 %
Lymphs Abs: 2075 cells/uL (ref 850–3900)
MCH: 29.9 pg (ref 27.0–33.0)
MCHC: 33.2 g/dL (ref 32.0–36.0)
MCV: 90.2 fL (ref 80.0–100.0)
MONOS PCT: 9 %
MPV: 10.5 fL (ref 7.5–12.5)
Monocytes Absolute: 747 cells/uL (ref 200–950)
NEUTROS ABS: 5229 {cells}/uL (ref 1500–7800)
Neutrophils Relative %: 63 %
PLATELETS: 222 10*3/uL (ref 140–400)
RBC: 5.18 MIL/uL — ABNORMAL HIGH (ref 3.80–5.10)
RDW: 15.1 % — AB (ref 11.0–15.0)
WBC: 8.3 10*3/uL (ref 3.8–10.8)

## 2016-06-16 LAB — HEPATIC FUNCTION PANEL
ALBUMIN: 3.8 g/dL (ref 3.6–5.1)
ALT: 17 U/L (ref 6–29)
AST: 18 U/L (ref 10–35)
Alkaline Phosphatase: 42 U/L (ref 33–130)
BILIRUBIN DIRECT: 0.1 mg/dL (ref <=0.2)
BILIRUBIN TOTAL: 0.5 mg/dL (ref 0.2–1.2)
Indirect Bilirubin: 0.4 mg/dL (ref 0.2–1.2)
Total Protein: 6.2 g/dL (ref 6.1–8.1)

## 2016-06-16 LAB — BASIC METABOLIC PANEL WITH GFR
BUN: 26 mg/dL — AB (ref 7–25)
CHLORIDE: 101 mmol/L (ref 98–110)
CO2: 22 mmol/L (ref 20–31)
CREATININE: 1.34 mg/dL — AB (ref 0.60–0.93)
Calcium: 10 mg/dL (ref 8.6–10.4)
GFR, EST AFRICAN AMERICAN: 45 mL/min — AB (ref >=60)
GFR, Est Non African American: 39 mL/min — ABNORMAL LOW (ref >=60)
Glucose, Bld: 85 mg/dL (ref 65–99)
Potassium: 4.4 mmol/L (ref 3.5–5.3)
SODIUM: 140 mmol/L (ref 135–146)

## 2016-06-16 LAB — TSH: TSH: 12.9 m[IU]/L — AB

## 2016-06-16 LAB — LIPID PANEL
CHOL/HDL RATIO: 5.3 ratio — AB (ref <5.0)
CHOLESTEROL: 154 mg/dL (ref <200)
HDL: 29 mg/dL — AB (ref >50)
LDL CALC: 76 mg/dL (ref <100)
TRIGLYCERIDES: 247 mg/dL — AB (ref <150)
VLDL: 49 mg/dL — ABNORMAL HIGH (ref <30)

## 2016-06-16 MED ORDER — IPRATROPIUM-ALBUTEROL 0.5-2.5 (3) MG/3ML IN SOLN: RESPIRATORY_TRACT | 1 refills | Status: DC

## 2016-06-16 NOTE — Patient Instructions (Signed)
Bleeding Precautions When on Anticoagulant Therapy WHAT IS ANTICOAGULANT THERAPY? Anticoagulant therapy is taking medicine to prevent or reduce blood clots. It is also called blood thinner therapy. Blood clots that form in your blood vessels can be dangerous. They can break loose and travel to your heart, lungs, or brain. This increases your risk of a heart attack or stroke. Anticoagulant therapy causes blood to clot more slowly. You may need anticoagulant therapy if you have:  A medical condition that increases the likelihood that blood clots will form.  A heart defect or a problem with heart rhythm. It is also a common treatment after heart surgery, such as valve replacement. WHAT ARE COMMON TYPES OF ANTICOAGULANT THERAPY? Anticoagulant medicine can be injected or taken by mouth.If you need anticoagulant therapy quickly at the hospital, the medicine may be injected under your skin or given through an IV tube. Heparin is a common example of an anticoagulant that you may get at the hospital. Most anticoagulant therapy is in the form of pills that you take at home every day. These may include:  Aspirin. This common blood thinner works by preventing blood cells (platelets) from sticking together to form a clot. Aspirin is not as strong as anticoagulants that slow down the time that it takes for your body to form a clot.  Clopidogrel. This is a newer type of drug that affects platelets. It is stronger than aspirin.  Warfarin. This is the most common anticoagulant. It changes the way your body uses vitamin K, a vitamin that helps your blood to clot. The risk of bleeding is higher with warfarin than with aspirin. You will need frequent blood tests to make sure you are taking the safest amount.  New anticoagulants. Several new drugs have been approved. They are all taken by mouth. Studies show that these drugs work as well as warfarin. They do not require blood testing. They may cause less bleeding  risk than warfarin. WHAT DO I NEED TO REMEMBER WHEN TAKING ANTICOAGULANT THERAPY? Anticoagulant therapy decreases your risk of forming a blood clot, but it increases your risk of bleeding. Work closely with your health care provider to make sure you are taking your medicine safely. These tips can help:  Learn ways to reduce your risk of bleeding.  If you are taking warfarin:  Have blood tests as ordered by your health care provider.  Do not make any sudden changes to your diet. Vitamin K in your diet can make warfarin less effective.  Do not get pregnant. This medicine may cause birth defects.  Take your medicine at the same time every day. If you forget to take your medicine, take it as soon as you remember. If you miss a whole day, do not double your dose of medicine. Take your normal dose and call your health care provider to check in.  Do not stop taking your medicine on your own.  Tell your health care provider before you start taking any new medicine, vitamin, or herbal product. Some of these could interfere with your therapy.  Tell all of your health care providers that you are on anticoagulant therapy.  Do not have surgery, medical procedures, or dental work until you tell your health care provider that you are on anticoagulant therapy. WHAT CAN AFFECT HOW ANTICOAGULANTS WORK? Certain foods, vitamins, medicines, supplements, and herbal medicines change the way that anticoagulant therapy works. They may increase or decrease the effects of your anticoagulant therapy. Either result can be dangerous for you.  Many over-the-counter medicines for pain, colds, or stomach problems interfere with anticoagulant therapy. Take these only as told by your health care provider.  Do not drink alcohol. It can interfere with your medicine and increase your risk of an injury that causes bleeding.  If you are taking warfarin, do not begin eating more foods that contain vitamin K. These include  leafy green vegetables. Ask your health care provider if you should avoid any foods. WHAT ARE SOME WAYS TO PREVENT BLEEDING? You can prevent bleeding by taking certain precautions:  Be extra careful when you use knives, scissors, or other sharp objects.  Use an electric razor instead of a blade.  Do not use toothpicks.  Use a soft toothbrush.  Wear shoes that have nonskid soles.  Use bath mats and handrails in your bathroom.  Wear gloves while you do yard work.  Wear a helmet when you ride a bike.  Wear your seat belt.  Prevent falls by removing loose rugs and extension cords from areas where you walk.  Do not play contact sports or participate in other activities that have a high risk of injury. WHEN SHOULD I CONTACT MY HEALTH CARE PROVIDER? Call your health care provider if:  You miss a dose of medicine: ? And you are not sure what to do. ? For more than one day.  You have: ? Menstrual bleeding that is heavier than normal. ? Blood in your urine. ? A bloody nose or bleeding gums. ? Easy bruising. ? Blood in your stool (feces) or have black and tarry stool. ? Side effects from your medicine.  You feel weak or dizzy.  You become pregnant. Seek immediate medical care if:  You have bleeding that will not stop.  You have sudden and severe headache or belly pain.  You vomit or you cough up bright red blood.  You have a severe blow to your head. WHAT ARE SOME QUESTIONS TO ASK MY HEALTH CARE PROVIDER?  What is the best anticoagulant therapy for my condition?  What side effects should I watch for?  When should I take my medicine? What should I do if I forget to take it?  Will I need to have regular blood tests?  Do I need to change my diet? Are there foods or drinks that I should avoid?  What activities are safe for me?  What should I do if I want to get pregnant? This information is not intended to replace advice given to you by your health care provider.  Make sure you discuss any questions you have with your health care provider. Document Released: 02/10/2015 Document Reviewed: 02/10/2015 [employer] Interactive Patient Education  2017 [employer]  ++++++++++++++++++++++++++++++++++ Recommend Adult Low Dose Aspirin or  coated  Aspirin 81 mg daily  To reduce risk of Colon Cancer 20 %,  Skin Cancer 26 % ,  Melanoma 46%  and  Pancreatic cancer 60% +++++++++++++++++++++++++ Vitamin D goal  is between 70-100.  Please make sure that you are taking your Vitamin D as directed.  It is very important as a natural anti-inflammatory  helping hair, skin, and nails, as well as reducing stroke and heart attack risk.  It helps your bones and helps with mood. It also decreases numerous cancer risks so please take it as directed.  Low Vit D is associated with a 200-300% higher risk for CANCER  and 200-300% higher risk for HEART   ATTACK  &  STROKE.   ...................................... It is also associated with   higher death rate at younger ages,  autoimmune diseases like Rheumatoid arthritis, Lupus, Multiple Sclerosis.    Also many other serious conditions, like depression, Alzheimer's Dementia, infertility, muscle aches, fatigue, fibromyalgia - just to name a few. ++++++++++++++++++++ Recommend the book "The END of DIETING" by Dr Joel Fuhrman  & the book "The END of DIABETES " by Dr Joel Fuhrman At [employer] - get book & Audio CD's    Being diabetic has a  300% increased risk for heart attack, stroke, cancer, and alzheimer- type vascular dementia. It is very important that you work harder with diet by avoiding all foods that are white. Avoid white rice (brown & wild rice is OK), white potatoes (sweetpotatoes in moderation is OK), White bread or wheat bread or anything made out of white flour like bagels, donuts, rolls, buns, biscuits, cakes, pastries, cookies, pizza crust, and pasta (made from white flour & egg whites) - vegetarian pasta or  spinach or wheat pasta is OK. Multigrain breads like Arnold's or Pepper[employer], or multigrain sandwich thins or flatbreads.  Diet, exercise and weight loss can reverse and cure diabetes in the early stages.  Diet, exercise and weight loss is very important in the control and prevention of complications of diabetes which affects every system in your body, ie. Brain - dementia/stroke, eyes - glaucoma/blindness, heart - heart attack/heart failure, kidneys - dialysis, stomach - gastric paralysis, intestines - malabsorption, nerves - severe painful neuritis, circulation - gangrene & loss of a leg(s), and finally cancer and Alzheimers.    I recommend avoid fried & greasy foods,  sweets/candy, white rice (brown or wild rice or Quinoa is OK), white potatoes (sweet potatoes are OK) - anything made from white flour - bagels, doughnuts, rolls, buns, biscuits,white and wheat breads, pizza crust and traditional pasta made of white flour & egg white(vegetarian pasta or spinach or wheat pasta is OK).  Multi-grain bread is OK - like multi-grain flat bread or sandwich thins. Avoid alcohol in excess. Exercise is also important.    Eat all the vegetables you want - avoid meat, especially red meat and dairy - especially cheese.  Cheese is the most concentrated form of trans-fats which is the worst thing to clog up our arteries. Veggie cheese is OK which can be found in the fresh produce section at [employer] or [employer] or [employer]  +++++++++++++++++++++ DASH Eating Plan  DASH stands for "Dietary Approaches to Stop Hypertension."   The DASH eating plan is a healthy eating plan that has been shown to reduce high blood pressure (hypertension). Additional health benefits may include reducing the risk of type 2 diabetes mellitus, heart disease, and stroke. The DASH eating plan may also help with weight loss. WHAT DO I NEED TO KNOW ABOUT THE DASH EATING PLAN? For the DASH eating plan, you will follow these general  guidelines:  Choose foods with a percent daily value for sodium of less than 5% (as listed on the food label).  Use salt-free seasonings or herbs instead of table salt or sea salt.  Check with your health care provider or pharmacist before using salt substitutes.  Eat lower-sodium products, often labeled as "lower sodium" or "no salt added."  Eat fresh foods.  Eat more vegetables, fruits, and low-fat dairy products.  Choose whole grains. Look for the word "whole" as the first word in the ingredient list.  Choose fish   Limit sweets, desserts, sugars, and sugary drinks.  Choose heart-healthy fats.  Eat veggie cheese     Eat more home-cooked food and less restaurant, buffet, and fast food.  Limit fried foods.  Cook foods using methods other than frying.  Limit canned vegetables. If you do use them, rinse them well to decrease the sodium.  When eating at a restaurant, ask that your food be prepared with less salt, or no salt if possible.                      WHAT FOODS CAN I EAT? Read Dr Joel Fuhrman's books on The End of Dieting & The End of Diabetes  Grains Whole grain or whole wheat bread. Brown rice. Whole grain or whole wheat pasta. Quinoa, bulgur, and whole grain cereals. Low-sodium cereals. Corn or whole wheat flour tortillas. Whole grain cornbread. Whole grain crackers. Low-sodium crackers.  Vegetables Fresh or frozen vegetables (raw, steamed, roasted, or grilled). Low-sodium or reduced-sodium tomato and vegetable juices. Low-sodium or reduced-sodium tomato sauce and paste. Low-sodium or reduced-sodium canned vegetables.   Fruits All fresh, canned (in natural juice), or frozen fruits.  Protein Products  All fish and seafood.  Dried beans, peas, or lentils. Unsalted nuts and seeds. Unsalted canned beans.  Dairy Low-fat dairy products, such as skim or 1% milk, 2% or reduced-fat cheeses, low-fat ricotta or cottage cheese, or plain low-fat yogurt. Low-sodium or  reduced-sodium cheeses.  Fats and Oils Tub margarines without trans fats. Light or reduced-fat mayonnaise and salad dressings (reduced sodium). Avocado. Safflower, olive, or canola oils. Natural peanut or almond butter.  Other Unsalted popcorn and pretzels. The items listed above may not be a complete list of recommended foods or beverages. Contact your dietitian for more options.  +++++++++++++++  WHAT FOODS ARE NOT RECOMMENDED? Grains/ White flour or wheat flour White bread. White pasta. White rice. Refined cornbread. Bagels and croissants. Crackers that contain trans fat.  Vegetables  Creamed or fried vegetables. Vegetables in a . Regular canned vegetables. Regular canned tomato sauce and paste. Regular tomato and vegetable juices.  Fruits Dried fruits. Canned fruit in light or heavy syrup. Fruit juice.  Meat and Other Protein Products Meat in general - RED meat & White meat.  Fatty cuts of meat. Ribs, chicken wings, all processed meats as bacon, sausage, bologna, salami, fatback, hot dogs, bratwurst and packaged luncheon meats.  Dairy Whole or 2% milk, cream, half-and-half, and cream cheese. Whole-fat or sweetened yogurt. Full-fat cheeses or blue cheese. Non-dairy creamers and whipped toppings. Processed cheese, cheese spreads, or cheese curds.  Condiments Onion and garlic salt, seasoned salt, table salt, and sea salt. Canned and packaged gravies. Worcestershire sauce. Tartar sauce. Barbecue sauce. Teriyaki sauce. Soy sauce, including reduced sodium. Steak sauce. Fish sauce. Oyster sauce. Cocktail sauce. Horseradish. Ketchup and mustard. Meat flavorings and tenderizers. Bouillon cubes. Hot sauce. Tabasco sauce. Marinades. Taco seasonings. Relishes.  Fats and Oils Butter, stick margarine, lard, shortening and bacon fat. Coconut, palm kernel, or palm oils. Regular salad dressings.  Pickles and olives. Salted popcorn and pretzels.  The items listed above may not be a complete  list of foods and beverages to avoid.   

## 2016-06-16 NOTE — Progress Notes (Signed)
This very nice 76 y.o. MWF presents for 3 month follow up with Hypertension, Hyperlipidemia, Pre-Diabetes and Vitamin D Deficiency.      Patient is treated for HTN (1999)  & BP has been controlled at home. She also has CKD3 (GFR 34 ml/min) consequent of her hypertension. Today's  .  Patient has hx/o cAfb predating since 1999 since which time she's been on Coumadin. Patient has had no complaints of any cardiac type chest pain, palpitations, dyspnea/orthopnea/PND, dizziness, claudication, or dependent edema.     Hyperlipidemia is controlled with diet & meds. Patient denies myalgias or other med SE's. Last Lipids were  Lab Results  Component Value Date   CHOL 152 03/01/2016   HDL 23 (L) 03/01/2016   LDLCALC 72 03/01/2016   TRIG 284 (H) 03/01/2016   CHOLHDL 6.6 (H) 03/01/2016      Also, the patient has history of Morbid Obesity (BMI 35+) and consequent PreDiabetes (A1c 5.7% in 2013) and has had no symptoms of reactive hypoglycemia, diabetic polys, paresthesias or visual blurring.  Last A1c was  Lab Results  Component Value Date   HGBA1C 5.2 03/01/2016      Further, the patient also has history of Vitamin D Deficiency ("33" in 2008)  and supplements vitamin D without any suspected side-effects. Last vitamin D was   Lab Results  Component Value Date   VD25OH 78 03/01/2016   Current Outpatient Prescriptions on File Prior to Visit  Medication Sig  . acetaminophen (TYLENOL) 500 MG tablet Take 1,000 mg by mouth 2 (two) times daily as needed for moderate pain.  Marland Kitchen albuterol (PROVENTIL HFA;VENTOLIN HFA) 108 (90 BASE) MCG/ACT inhaler 2 puffs 5 minutes apart every 4 to 6 hours as needed to rescue asthma (Patient taking differently: Inhale 2 puffs into the lungs every 4 (four) hours as needed for shortness of breath. Inhale 2 puffs into the lungs 5 minutes apart every 4 to 6 hours as needed to rescue asthma)  . albuterol (PROVENTIL) (2.5 MG/3ML) 0.083% nebulizer solution Take 3 mLs (2.5 mg total)  by nebulization every 6 (six) hours as needed for wheezing or shortness of breath.  . allopurinol (ZYLOPRIM) 300 MG tablet TAKE 1 TABLET BY MOUTH EVERY DAY  . aspirin 81 MG tablet Take 81 mg by mouth daily.    Marland Kitchen azelastine (ASTELIN) 0.1 % nasal spray Place 2 sprays into both nostrils 2 (two) times daily. Use in each nostril as directed (Patient taking differently: Place 2 sprays into both nostrils at bedtime as needed for rhinitis or allergies. Use in each nostril as directed)  . azithromycin (ZITHROMAX) 250 MG tablet Take 250 mg by mouth daily.  Marland Kitchen buPROPion (WELLBUTRIN XL) 150 MG 24 hr tablet Take 1 tablet (150 mg total) by mouth every morning.  . cetirizine (ZYRTEC) 10 MG tablet TAKE 1 CAPSULE (10 MG TOTAL) BY MOUTH AT BEDTIME.  . digoxin (LANOXIN) 0.25 MG tablet TAKE 1 TABLET BY MOUTH DAILY (Patient taking differently: TAKE 1/2 TABLET BY MOUTH DAILY)  . diltiazem (CARDIZEM CD) 120 MG 24 hr capsule Take 1 capsule (120 mg total) by mouth daily.  . Fluticasone-Umeclidin-Vilant (TRELEGY ELLIPTA) 100-62.5-25 MCG/INH AEPB Inhale 1 puff into the lungs daily.  . furosemide (LASIX) 80 MG tablet TAKE 1 TABLET BY MOUTH TWICE A DAY AS NEEDED FOR FLUID (Patient taking differently: TAKE 1/2 TABLET BY MOUTH DAILY.)  . gabapentin (NEURONTIN) 300 MG capsule TAKE ONE CAPSULE BY MOUTH 3 TIMES A DAY  . hydroxypropyl methylcellulose /  hypromellose (ISOPTO TEARS / GONIOVISC) 2.5 % ophthalmic solution Place 1 drop into both eyes 4 (four) times daily as needed for dry eyes.   Marland Kitchen ipratropium (ATROVENT) 0.02 % nebulizer solution Take 2.5 mLs (0.5 mg total) by nebulization every 6 (six) hours as needed for wheezing or shortness of breath.  . levalbuterol (XOPENEX) 1.25 MG/0.5ML nebulizer solution Take 1.25 mg by nebulization every 6 (six) hours as needed for wheezing or shortness of breath.  . levothyroxine (SYNTHROID, LEVOTHROID) 50 MCG tablet TAKE 1 TABLET EVERY DAY (Patient taking differently: TAKE 1/2 TABLET EVERY DAY)    . meclizine (ANTIVERT) 25 MG tablet Take 1 tablet (25 mg total) by mouth 3 (three) times daily as needed for dizziness.  . montelukast (SINGULAIR) 10 MG tablet TAKE 1 TABLET EVERY DAY FOR ALLERGIES  . Olopatadine HCl 0.2 % SOLN INSTILL 1 DROP INTO BOTH EYES TWICE DAILY  . potassium chloride SA (KLOR-CON M20) 20 MEQ tablet Take 2 tablets (40 mEq total) by mouth daily.  . pravastatin (PRAVACHOL) 40 MG tablet TAKE 1 TABLET BY MOUTH AT BEDTIME FOR CHOLESTEROL (Patient taking differently: TAKE 1/2 TABLET BY MOUTH AT BEDTIME FOR CHOLESTEROL)  . prednisoLONE acetate (PRED FORTE) 1 % ophthalmic suspension INSTILL 1 DROP INTO RIGHT EYE 4 TIMES A DAY  . prochlorperazine (COMPAZINE) 5 MG tablet TAKE 1 TABLET BY MOUTH 3 TIMES A DAY FOR VERTIGO OR NAUSEA  . prochlorperazine (COMPAZINE) 5 MG tablet TAKE 1 TABLET BY MOUTH 3 TIMES A DAY FOR VERTIGO OR NAUSEA  . warfarin (COUMADIN) 2 MG tablet TAKE 1 TO 2 TABLETS BY MOUTH DAILY OR AS DIRECTED (Patient taking differently: Take 1 tablet daily.)   No current facility-administered medications on file prior to visit.    Allergies  Allergen Reactions  . Amiodarone Other (See Comments)    PULMONARY TOXICITY  . Diovan [Valsartan] Other (See Comments)    HYPOTENSION  . Doxycycline Diarrhea and Other (See Comments)    VISUAL DISTURBANCE  . Flexeril [Cyclobenzaprine] Other (See Comments)    FATIGUE  . Keflex [Cephalexin] Diarrhea  . Verapamil Other (See Comments)    EDEMA  . Advair Diskus [Fluticasone-Salmeterol] Other (See Comments)    SKIN CHANGES.  Can tolerate low dose of prednisone without complications  . Codeine Other (See Comments)    unknown   PMHx:   Past Medical History:  Diagnosis Date  . Aortic stenosis    mild AS witm mild to mod AR by 11/2012 echo  . Arthritis   . Atrial fibrillation ([place])   . Atrial flutter ([place])   . Bronchitis, chronic ([place])   . Cervical dysplasia   . Coronary artery disease   . Cystocele    either on bladder or  kidneys patient is unsure but stated physician will watch it  . Dizziness    If patient gets out of bed too fast  . Family history of anesthesia complication    Hard for daughter to wake up after Anesthesia  . Gout   . H/O hiatal hernia   . History of Bell's palsy   . Migraine headache   . Osteopenia   . Rosacea conjunctivitis    Right eye is worse than left eye  . Seasonal allergies   . Shortness of breath    with exertion  . Thyroid disease    Hypo  . Tobacco abuse   . Unspecified vitamin D deficiency    Immunization History  Administered Date(s) Administered  . Influenza, High Dose Seasonal PF  12/20/2013, 12/09/2014, 11/14/2015  . Influenza-Unspecified 01/02/2013  . Pneumococcal Conjugate-13 01/23/2014  . Pneumococcal-Unspecified 03/15/1993, 05/31/2008  . Td 03/16/2007  . Varicella 02/19/2008   Past Surgical History:  Procedure Laterality Date  . ABDOMINAL AORTIC ENDOVASCULAR STENT GRAFT N/A 04/11/2013   Procedure: ABDOMINAL AORTIC ENDOVASCULAR STENT GRAFT WITH RIGHT FEMORAL PATCH ANGIOPLASTY;  Surgeon: Mal Misty, MD;  Location: [facility];  Service: Vascular;  Laterality: N/A;  . BREAST SURGERY     LEFT BREAST BIOPSY  . broken leg Left 1970s  . CARDIAC CATHETERIZATION    . CHOLECYSTECTOMY  1972  . COLONOSCOPY    . EYE SURGERY Bilateral   . Laser vein procedure    . REFRACTIVE SURGERY    . TONSILLECTOMY     FHx:    Reviewed / unchanged  SHx:    Reviewed / unchanged  Systems Review:  Constitutional: Denies fever, chills, wt changes, headaches, insomnia, fatigue, night sweats, change in appetite. Eyes: Denies redness, blurred vision, diplopia, discharge, itchy, watery eyes.  ENT: Denies discharge, congestion, post nasal drip, epistaxis, sore throat, earache, hearing loss, dental pain, tinnitus, vertigo, sinus pain, snoring.  CV: Denies chest pain, palpitations, irregular heartbeat, syncope, dyspnea, diaphoresis, orthopnea, PND, claudication or edema. Respiratory:  denies cough, dyspnea, DOE, pleurisy, hoarseness, laryngitis, wheezing.  Gastrointestinal: Denies dysphagia, odynophagia, heartburn, reflux, water brash, abdominal pain or cramps, nausea, vomiting, bloating, diarrhea, constipation, hematemesis, melena, hematochezia  or hemorrhoids. Genitourinary: Denies dysuria, frequency, urgency, nocturia, hesitancy, discharge, hematuria or flank pain. Musculoskeletal: Denies arthralgias, myalgias, stiffness, jt. swelling, pain, limping or strain/sprain.  Skin: Denies pruritus, rash, hives, warts, acne, eczema or change in skin lesion(s). Neuro: No weakness, tremor, incoordination, spasms, paresthesia or pain. Psychiatric: Denies confusion, memory loss or sensory loss. Endo: Denies change in weight, skin or hair change.  Heme/Lymph: No excessive bleeding, bruising or enlarged lymph nodes.  Physical Exam  There were no vitals taken for this visit.  Appears well nourished, well groomed  and in no distress.  Eyes: PERRLA, EOMs, conjunctiva no swelling or erythema. Sinuses: No frontal/maxillary tenderness ENT/Mouth: EAC's clear, TM's nl w/o erythema, bulging. Nares clear w/o erythema, swelling, exudates. Oropharynx clear without erythema or exudates. Oral hygiene is good. Tongue normal, non obstructing. Hearing intact.  Neck: Supple. Thyroid nl. Car 2+/2+ without bruits, nodes or JVD. Chest: Respirations nl with BS clear & equal w/o rales, rhonchi, wheezing or stridor.  Cor: Heart sounds normal w/ regular rate and rhythm without sig. murmurs, gallops, clicks or rubs. Peripheral pulses normal and equal  without edema.  Abdomen: Soft & bowel sounds normal. Non-tender w/o guarding, rebound, hernias, masses or organomegaly.  Lymphatics: Unremarkable.  Musculoskeletal: Full ROM all peripheral extremities, joint stability, 5/5 strength and normal gait.  Skin: Warm, dry without exposed rashes, lesions or ecchymosis apparent.  Neuro: Cranial nerves intact, reflexes  equal bilaterally. Sensory-motor testing grossly intact. Tendon reflexes grossly intact.  Pysch: Alert & oriented x 3.  Insight and judgement nl & appropriate. No ideations.  Assessment and Plan:  - Continue medication, monitor blood pressure at home.  - Continue DASH diet. Reminder to go to the ER if any CP,  SOB, nausea, dizziness, severe HA, changes vision/speech,  left arm numbness and tingling and jaw pain.  - Continue diet/meds, exercise,& lifestyle modifications.  - Continue monitor periodic cholesterol/liver & renal functions   - Continue diet, exercise, lifestyle modifications.  - Monitor appropriate labs. - Continue supplementation.      Discussed  regular exercise, BP monitoring, weight control  to achieve/maintain BMI less than 25 and discussed med and SE's. Recommended labs to assess and monitor clinical status with further disposition pending results of labs. Over 30 minutes of exam, counseling, chart review was performed.

## 2016-06-17 LAB — HEMOGLOBIN A1C
Hgb A1c MFr Bld: 5.7 % — ABNORMAL HIGH (ref <5.7)
Mean Plasma Glucose: 117 mg/dL

## 2016-06-17 LAB — INSULIN, RANDOM: Insulin: 74.1 u[IU]/mL — ABNORMAL HIGH (ref 2.0–19.6)

## 2016-06-17 LAB — MAGNESIUM: MAGNESIUM: 2.1 mg/dL (ref 1.5–2.5)

## 2016-06-17 LAB — URIC ACID: Uric Acid, Serum: 5.2 mg/dL (ref 2.5–7.0)

## 2016-06-17 LAB — PROTIME-INR
INR: 1.7 — AB
Prothrombin Time: 17.7 s — ABNORMAL HIGH (ref 9.0–11.5)

## 2016-06-17 LAB — VITAMIN D 25 HYDROXY (VIT D DEFICIENCY, FRACTURES): VIT D 25 HYDROXY: 73 ng/mL (ref 30–100)

## 2016-06-18 ENCOUNTER — Other Ambulatory Visit: Payer: Self-pay | Admitting: Internal Medicine

## 2016-06-21 DIAGNOSIS — Z961 Presence of intraocular lens: Secondary | ICD-10-CM | POA: Diagnosis not present

## 2016-06-21 DIAGNOSIS — H10503 Unspecified blepharoconjunctivitis, bilateral: Secondary | ICD-10-CM | POA: Diagnosis not present

## 2016-06-23 ENCOUNTER — Ambulatory Visit: Payer: Medicare Other | Admitting: Internal Medicine

## 2016-06-23 ENCOUNTER — Ambulatory Visit (HOSPITAL_COMMUNITY)
Admission: RE | Admit: 2016-06-23 | Discharge: 2016-06-23 | Disposition: A | Discharge status: Home / Self Care | Payer: Medicare Other | Source: Ambulatory Visit | Attending: Internal Medicine | Admitting: Internal Medicine

## 2016-06-23 ENCOUNTER — Encounter: Payer: Self-pay | Admitting: Internal Medicine

## 2016-06-23 VITALS — BP 112/82 | HR 92 | Temp 97.3°F | Resp 18 | Ht 63.0 in | Wt 203.0 lb

## 2016-06-23 DIAGNOSIS — Z136 Encounter for screening for cardiovascular disorders: Secondary | ICD-10-CM

## 2016-06-23 DIAGNOSIS — Z79899 Other long term (current) drug therapy: Secondary | ICD-10-CM

## 2016-06-23 DIAGNOSIS — I700 Atherosclerosis of aorta: Secondary | ICD-10-CM | POA: Insufficient documentation

## 2016-06-23 DIAGNOSIS — R0600 Dyspnea, unspecified: Secondary | ICD-10-CM

## 2016-06-23 DIAGNOSIS — R079 Chest pain, unspecified: Secondary | ICD-10-CM

## 2016-06-23 DIAGNOSIS — I10 Essential (primary) hypertension: Secondary | ICD-10-CM

## 2016-06-23 DIAGNOSIS — I482 Chronic atrial fibrillation, unspecified: Secondary | ICD-10-CM

## 2016-06-23 DIAGNOSIS — Z7901 Long term (current) use of anticoagulants: Secondary | ICD-10-CM | POA: Diagnosis not present

## 2016-06-23 DIAGNOSIS — I517 Cardiomegaly: Secondary | ICD-10-CM | POA: Diagnosis not present

## 2016-06-23 DIAGNOSIS — R609 Edema, unspecified: Secondary | ICD-10-CM

## 2016-06-23 DIAGNOSIS — J439 Emphysema, unspecified: Secondary | ICD-10-CM | POA: Diagnosis not present

## 2016-06-23 LAB — BASIC METABOLIC PANEL WITH GFR
BUN: 42 mg/dL — ABNORMAL HIGH (ref 7–25)
CO2: 30 mmol/L (ref 20–31)
Calcium: 10.7 mg/dL — ABNORMAL HIGH (ref 8.6–10.4)
Chloride: 89 mmol/L — ABNORMAL LOW (ref 98–110)
Creat: 1.66 mg/dL — ABNORMAL HIGH (ref 0.60–0.93)
GFR, EST AFRICAN AMERICAN: 35 mL/min — AB (ref >=60)
GFR, EST NON AFRICAN AMERICAN: 30 mL/min — AB (ref >=60)
GLUCOSE: 95 mg/dL (ref 65–99)
POTASSIUM: 3.7 mmol/L (ref 3.5–5.3)
Sodium: 135 mmol/L (ref 135–146)

## 2016-06-23 LAB — CBC WITH DIFFERENTIAL/PLATELET
BASOS PCT: 0 %
Basophils Absolute: 0 cells/uL (ref 0–200)
Eosinophils Absolute: 182 cells/uL (ref 15–500)
Eosinophils Relative: 2 %
HEMATOCRIT: 46.3 % — AB (ref 35.0–45.0)
Hemoglobin: 15.8 g/dL — ABNORMAL HIGH (ref 11.7–15.5)
LYMPHS ABS: 2184 {cells}/uL (ref 850–3900)
LYMPHS PCT: 24 %
MCH: 30.4 pg (ref 27.0–33.0)
MCHC: 34.1 g/dL (ref 32.0–36.0)
MCV: 89.2 fL (ref 80.0–100.0)
MONO ABS: 1001 {cells}/uL — AB (ref 200–950)
MPV: 10.7 fL (ref 7.5–12.5)
Monocytes Relative: 11 %
NEUTROS ABS: 5733 {cells}/uL (ref 1500–7800)
Neutrophils Relative %: 63 %
Platelets: 214 10*3/uL (ref 140–400)
RBC: 5.19 MIL/uL — AB (ref 3.80–5.10)
RDW: 15.3 % — AB (ref 11.0–15.0)
WBC: 9.1 10*3/uL (ref 3.8–10.8)

## 2016-06-23 LAB — PROTIME-INR
INR: 1.8 — ABNORMAL HIGH
PROTHROMBIN TIME: 18.6 s — AB (ref 9.0–11.5)

## 2016-06-23 NOTE — Progress Notes (Signed)
Subjective:    Patient ID: Katie [name], female    DOB: [birth year], 75 y.o.   MRN: [id number]  HPI  This nice 76 yo MWF with HTN, HLD, ASHD/combined sys/dias chronic heart failure/cAfib on Coumadin, CKD3 (GFR 39), COPD represents with worsening DOE x 5 days. Has had intermittent Chest pressure unrelated to activity, and has been on increased Lasix 80 mg daily  with qod Metolazone 2.5 mg trying to initiate a diuresis. Patient is using home Neb and with no sig improvement  Medication Sig  . acetaminophen 500 MG tablet Take 1,000 mg  2 x daily as needed   . albuterol  neb solution Take 2.5 mg by neb every 6 hrs as needed   . allopurinol  300 MG tablet TAKE 1 TAB EVERY DAY  . aspirin 81 MG tablet Take  daily.    . ASTELIN nasal spray 2 sprays into  nostrils 2 x daily.   Marland Kitchen buPROPion-XL 150 MG  Take 1 tab every morning.  . cetirizine  10 MG tablet TAKE 1 CAP AT BEDTIME.  . digoxin  0.25 MG tablet TAKE 1/2 TAB DAILY)  . diltiazem  CD 120 MG  Take 1 cap daily.  . TRELEGY ELLIPTA  Inhale 1 puff into the lungs daily.  . furosemide (LASIX) 80 MG t  TAKE 1/2 TAB DAILY.)  . gabapentin 300 MG capsule TAKE ONE CAP 3 x DAY  . ISOPTO TEARS  ophth soln 1 drop into both eyes 4 xdaily as needed   . DUONEB (3) MG/3ML SOLN or  Inhale 3 ml 4 x/ day or every 4 hours for chronic asthma  . XOPENEX 1.25 MG neb soln Take 1.25 mg by neb every 6  hrs as needed   . levothyroxine 50 MCG tablet TAKE 1/2 TAB EVERY DAY  . meclizine25 MG tablet Take 1 tab 3 x daily   . metolazone  2.5 MG tablet TAKE 1 TAB DAILY.  . montelukast  10 MG tablet TAKE 1 TAB EVERY DAY FOR ALLERGIES  . Olopatadine HCl 0.2 % SOLN INSTILL 1 DROP INTO BOTH EYES TWICE DAILY  . KLOR-CON  20 MEQ tabl Take 2 tablets daily.  . pravastatin  40 MG tablet TAKE 1/2 TAB AT BEDTIME  . prochlorperazine  5 MG tab TAKE 1 TAB 3 TIMES   . warfarin 2 MG tablet Take 1.5 x 5 days & 1 x 2 days  . albuterol  HFA   inhaler 2 puffs every 4 (four) hours as needed     Allergies  Allergen Reactions  . Amiodarone PULMONARY TOXICITY  . Diovan [Valsartan] HYPOTENSION  . Doxycycline VISUAL DISTURBANCE  . Flexeril [Cyclobenzaprine] FATIGUE  . Keflex [Cephalexin] Diarrhea  . Verapamil EDEMA  . Advair Diskus [Fluticasone-Salmeterol] SKIN CHANGES.  . Codeine unknown   Past Medical History:  Diagnosis Date  . Aortic stenosis    mild AS witm mild to mod AR by 11/2012 echo  . Arthritis   . Atrial fibrillation ([place])   . Atrial flutter ([place])   . Bronchitis, chronic ([place])   . Cervical dysplasia   . Coronary artery disease   . Cystocele    either on bladder or kidneys patient is unsure but stated physician will watch it  . Dizziness    If patient gets out of bed too fast  . Family history of anesthesia complication    Hard for daughter to wake up after Anesthesia  . Gout   . H/O hiatal hernia   .  History of Bell's palsy   . Migraine headache   . Osteopenia   . Rosacea conjunctivitis    Right eye is worse than left eye  . Seasonal allergies   . Shortness of breath    with exertion  . Thyroid disease    Hypo  . Tobacco abuse   . Unspecified vitamin D deficiency    Past Surgical History:  Procedure Laterality Date  . ABDOMINAL AORTIC ENDOVASCULAR STENT GRAFT N/A 04/11/2013   Procedure: ABDOMINAL AORTIC ENDOVASCULAR STENT GRAFT WITH RIGHT FEMORAL PATCH ANGIOPLASTY;  Surgeon: Mal Misty, MD;  Location: [facility];  Service: Vascular;  Laterality: N/A;  . BREAST SURGERY     LEFT BREAST BIOPSY  . broken leg Left 1970s  . CARDIAC CATHETERIZATION    . CHOLECYSTECTOMY  1972  . COLONOSCOPY    . EYE SURGERY Bilateral   . Laser vein procedure    . REFRACTIVE SURGERY    . TONSILLECTOMY     Review of Systems  10 point systems review negative except as above.    Objective:   Physical Exam  BP 112/82   Pulse 92   Temp 97.3 F (36.3 C)   Resp 18   Ht 5\' 3"  (1.6 m)   Wt 203 lb (92.1 kg)   BMI 35.96 kg/m   HEENT - Eac's patent. TM's Nl. EOM's  full. PERRLA. NasoOroPharynx clear. Neck - supple. Nl Thyroid. Carotids 2+ & No bruits, nodes, JVD Chest - Clear equal BS w/o Rales, rhonchi, wheezes. Cor - HS soft with sl irreg rate & rhythm w/o sig MGR. PP 1(+). 1-2 (+) ankle/pedal edema. Abd - Soft w/o palpable organomegaly, masses or tenderness. BS nl. MS- FROM w/o deformities. Muscle power, tone and bulk Nl. Gait Nl. Neuro - Nl w/o focal abnormalities.    Assessment & Plan:   1. Dyspnea  - DG Chest 2 - mild COPD - No PNA, No Pulm Edema - Brain natriuretic peptide - EKG 12-Lead - Troponin I - Ambulatory referral to Cardiology  2. Essential hypertension  - CBC with Differential/Platelet - BASIC METABOLIC PANEL WITH GFR  3. Chronic atrial fibrillation (HCC)  - Protime-INR - Digoxin level - EKG 12-Lead - Ambulatory referral to Cardiology  4. Edema   5. Chest pain  - Troponin I - Ambulatory referral to Cardiology  6. Screening for ischemic heart disease  - EKG - show Afib with LBBB  & ? DE - NSCSPT  7. Long-term (current) use of anticoagulants - Protime-INR  8. Medication management  - CBC with Differential/Platelet - BASIC METABOLIC PANEL WITH GFR - Digoxin level     Over 30 minutes of exam, counseling, chart review and complex critical decision making was performed

## 2016-06-23 NOTE — Patient Instructions (Signed)
Increase Lasix 80 mg 2 x/day for now   And  Take Metolazone every day

## 2016-06-24 LAB — BRAIN NATRIURETIC PEPTIDE: BRAIN NATRIURETIC PEPTIDE: 57.8 pg/mL (ref <100)

## 2016-06-24 LAB — DIGOXIN LEVEL: DIGOXIN LVL: 1.5 ug/L (ref 0.8–2.0)

## 2016-06-29 ENCOUNTER — Encounter: Payer: Self-pay | Admitting: Internal Medicine

## 2016-06-29 ENCOUNTER — Ambulatory Visit (INDEPENDENT_AMBULATORY_CARE_PROVIDER_SITE_OTHER): Payer: Medicare Other | Admitting: Internal Medicine

## 2016-06-29 VITALS — BP 120/66 | HR 64 | Temp 97.3°F | Resp 16 | Ht 63.0 in | Wt 201.0 lb

## 2016-06-29 DIAGNOSIS — I5032 Chronic diastolic (congestive) heart failure: Secondary | ICD-10-CM | POA: Diagnosis not present

## 2016-06-29 DIAGNOSIS — M5431 Sciatica, right side: Secondary | ICD-10-CM | POA: Diagnosis not present

## 2016-06-29 DIAGNOSIS — I482 Chronic atrial fibrillation, unspecified: Secondary | ICD-10-CM

## 2016-06-29 DIAGNOSIS — R0600 Dyspnea, unspecified: Secondary | ICD-10-CM

## 2016-06-29 MED ORDER — PREDNISONE 20 MG PO TABS: ORAL_TABLET | ORAL | 0 refills | Status: DC

## 2016-06-29 NOTE — Progress Notes (Signed)
Subjective:    Patient ID: Katie [name], female    DOB: 1940-03-23, 76 y.o.   MRN: [id number]  HPI  This nice 76 yo MWF return for f/u after increasing her Lasix 80 mg up to bid wit Metolazone 2.5 mg trying to initiate more diuresis of her worsening dependent edema felt more related to chronic venostasis edema than her known hx/o chronic combined sys/dias heart failure. Recent BNP was down to 57 and her Troponin was neg and EXG was non acute. Patient has diuresed only 2 # in the last week, but "feels" her edema is less and her breathing is "much better". Her daughter is currently critically ill in the ICU. Coumadin was sl increased at last OV and will recheck at 2 week f/u.     She's also c/o R sciatica after long spells sitting in chairs at the ICU.   Medication Sig  . acetaminophen (TYLENOL) 500 MG tablet Take 1,000 mg by mouth 2 (two) times daily as needed for moderate pain.  Marland Kitchen albuterol (PROVENTIL) (2.5 MG/3ML) 0.083% nebulizer solution Take 3 mLs (2.5 mg total) by nebulization every 6 (six) hours as needed for wheezing or shortness of breath.  . allopurinol (ZYLOPRIM) 300 MG tablet TAKE 1 TABLET BY MOUTH EVERY DAY  . aspirin 81 MG tablet Take 81 mg by mouth daily.    Marland Kitchen azelastine (ASTELIN) 0.1 % nasal spray Place 2 sprays into both nostrils 2 (two) times daily. Use in each nostril as directed (Patient taking differently: Place 2 sprays into both nostrils at bedtime as needed for rhinitis or allergies. Use in each nostril as directed)  . buPROPion (WELLBUTRIN XL) 150 MG 24 hr tablet Take 1 tablet (150 mg total) by mouth every morning.  . cetirizine (ZYRTEC) 10 MG tablet TAKE 1 CAPSULE (10 MG TOTAL) BY MOUTH AT BEDTIME.  . digoxin (LANOXIN) 0.25 MG tablet TAKE 1 TABLET BY MOUTH DAILY (Patient taking differently: TAKE 1/2 TABLET BY MOUTH DAILY)  . diltiazem (CARDIZEM CD) 120 MG 24 hr capsule Take 1 capsule (120 mg total) by mouth daily.  . Fluticasone-Umeclidin-Vilant (TRELEGY ELLIPTA)  100-62.5-25 MCG/INH AEPB Inhale 1 puff into the lungs daily.  . furosemide (LASIX) 80 MG tablet TAKE 1 TABLET BY MOUTH TWICE A DAY AS NEEDED FOR FLUID (Patient taking differently: TAKE 1/2 TABLET BY MOUTH DAILY.)  . gabapentin (NEURONTIN) 300 MG capsule TAKE ONE CAPSULE BY MOUTH 3 TIMES A DAY  . hydroxypropyl methylcellulose / hypromellose (ISOPTO TEARS / GONIOVISC) 2.5 % ophthalmic solution Place 1 drop into both eyes 4 (four) times daily as needed for dry eyes.   Marland Kitchen ipratropium-albuterol (DUONEB) 0.5-2.5 (3) MG/3ML SOLN Inhale 3 ml 4 x/ day or every 4 hours for chronic asthma  . levalbuterol (XOPENEX) 1.25 MG/0.5ML nebulizer solution Take 1.25 mg by nebulization every 6 (six) hours as needed for wheezing or shortness of breath.  . levothyroxine (SYNTHROID, LEVOTHROID) 50 MCG tablet TAKE 1 TABLET EVERY DAY (Patient taking differently: TAKE 1/2 TABLET EVERY DAY)  . meclizine (ANTIVERT) 25 MG tablet Take 1 tablet (25 mg total) by mouth 3 (three) times daily as needed for dizziness.  . metolazone (ZAROXOLYN) 2.5 MG tablet TAKE 1 TABLET (2.5 MG TOTAL) BY MOUTH DAILY.  . montelukast (SINGULAIR) 10 MG tablet TAKE 1 TABLET EVERY DAY FOR ALLERGIES  . Olopatadine HCl 0.2 % SOLN INSTILL 1 DROP INTO BOTH EYES TWICE DAILY  . potassium chloride SA (KLOR-CON M20) 20 MEQ tablet Take 2 tablets (40 mEq total) by mouth  daily.  . pravastatin (PRAVACHOL) 40 MG tablet TAKE 1 TABLET BY MOUTH AT BEDTIME FOR CHOLESTEROL (Patient taking differently: TAKE 1/2 TABLET BY MOUTH AT BEDTIME FOR CHOLESTEROL)  . prednisoLONE acetate (PRED FORTE) 1 % ophthalmic suspension INSTILL 1 DROP INTO RIGHT EYE 4 TIMES A DAY  . prochlorperazine (COMPAZINE) 5 MG tablet TAKE 1 TABLET BY MOUTH 3 TIMES A DAY FOR VERTIGO OR NAUSEA  . warfarin (COUMADIN) 2 MG tablet TAKE 1 TO 2 TABLETS BY MOUTH DAILY OR AS DIRECTED (Patient taking differently: Take 1 tablet daily.)  . albuterol (PROVENTIL HFA;VENTOLIN HFA) 108 (90 BASE) MCG/ACT inhaler 2 puffs 5  minutes apart every 4 to 6 hours as needed to rescue asthma (Patient taking differently: Inhale 2 puffs into the lungs every 4 (four) hours as needed for shortness of breath. Inhale 2 puffs into the lungs 5 minutes apart every 4 to 6 hours as needed to rescue asthma)   Allergies  Allergen Reactions  . Amiodarone Other (See Comments)    PULMONARY TOXICITY  . Diovan [Valsartan] Other (See Comments)    HYPOTENSION  . Doxycycline Diarrhea and Other (See Comments)    VISUAL DISTURBANCE  . Flexeril [Cyclobenzaprine] Other (See Comments)    FATIGUE  . Keflex [Cephalexin] Diarrhea  . Verapamil Other (See Comments)    EDEMA  . Advair Diskus [Fluticasone-Salmeterol] Other (See Comments)    SKIN CHANGES.  Can tolerate low dose of prednisone without complications  . Codeine Other (See Comments)    unknown   Past Medical History:  Diagnosis Date  . Aortic stenosis    mild AS witm mild to mod AR by 11/2012 echo  . Arthritis   . Atrial fibrillation ([place])   . Atrial flutter ([place])   . Bronchitis, chronic ([place])   . Cervical dysplasia   . Coronary artery disease   . Cystocele   . Dizziness    If patient gets out of bed too fast  . Family history of anesthesia complication    Hard for daughter to wake up after Anesthesia  . Gout   . H/O hiatal hernia   . History of Bell's palsy   . hypothyroid   . Migraine headache   . Osteopenia   . Rosacea conjunctivitis    Right eye is worse than left eye  . Seasonal allergies   . Shortness of breath    with exertion  . Tobacco abuse   . Unspecified vitamin D deficiency    Review of Systems  10 point systems review negative except as above.    Objective:   Physical Exam  BP 120/66   Pulse 64   Temp 97.3 F (36.3 C)   Resp 16   Ht 5\' 3"  (1.6 m)   Wt 201 lb (91.2 kg)   BMI 35.61 kg/m   HEENT - WNL. Neck - supple. Nl Thyroid. Carotids 2+ & No bruits, nodes, JVD Chest - Clear equal BS w/o Rales, rhonchi, wheezes. Cor - Nl HS. Sl irreg RR  w/o sig MGR. PP 1(+).  1-2 (+) edema. MS- FROM w/o deformities. Muscle power, tone and bulk Nl. Gait Nl. Neuro - No obvious Cr N abnormalities.  Nl w/o focal abnormalities.    Assessment & Plan:   1. Chronic atrial fibrillation ([place])   2. Chronic diastolic congestive heart failure ([place])   3. Dyspnea   4. Right sided sciatica  - predniSONE (DELTASONE) 20 MG tablet; 1 tab 3 x day for 3 days, then 1 tab 2  x day for 3 days, then 1 tab 1 x day for 5 days  Dispense: 20 tablet; Refill: 0

## 2016-06-30 ENCOUNTER — Other Ambulatory Visit: Payer: Self-pay | Admitting: Internal Medicine

## 2016-06-30 ENCOUNTER — Other Ambulatory Visit: Payer: Self-pay | Admitting: *Deleted

## 2016-06-30 DIAGNOSIS — M5431 Sciatica, right side: Secondary | ICD-10-CM

## 2016-06-30 MED ORDER — PREDNISONE 20 MG PO TABS
ORAL_TABLET | ORAL | 0 refills | Status: DC
Start: 2016-06-30 — End: 2016-07-16

## 2016-07-05 ENCOUNTER — Telehealth: Payer: Self-pay | Admitting: *Deleted

## 2016-07-05 MED ORDER — ALPRAZOLAM 0.5 MG PO TABS: ORAL_TABLET | ORAL | 0 refills | Status: DC

## 2016-07-05 NOTE — Telephone Encounter (Signed)
Patient called and states she is still having difficulty sleeping and feels anxious. Per Dr Melford Aase, send an RX for Alprazolam 0.5 mg.  Patient is aware and was advised to start with a 1/2 tablet to see how they effect her.

## 2016-07-06 ENCOUNTER — Telehealth: Payer: Self-pay | Admitting: *Deleted

## 2016-07-06 NOTE — Telephone Encounter (Signed)
Patient called and reported she has lost 8 pounds since her last OV.  She has reduced her Furosemide to 40 mg BID and is holding the Metolazone 2.5 mg for now. She states she took the Alprazolam 0.5 mg 1/2 tablet and slept better.  Dr Melford Aase is aware of changes.

## 2016-07-07 ENCOUNTER — Inpatient Hospital Stay (HOSPITAL_COMMUNITY): Payer: Medicare Other

## 2016-07-07 ENCOUNTER — Encounter (HOSPITAL_COMMUNITY): Payer: Self-pay

## 2016-07-07 ENCOUNTER — Inpatient Hospital Stay (HOSPITAL_COMMUNITY)
Admission: EM | Admit: 2016-07-07 | Discharge: 2016-07-16 | DRG: 682 | Disposition: A | Discharge status: Home / Self Care | Payer: Medicare Other | Attending: Internal Medicine | Admitting: Internal Medicine

## 2016-07-07 ENCOUNTER — Emergency Department (HOSPITAL_COMMUNITY): Payer: Medicare Other

## 2016-07-07 DIAGNOSIS — R042 Hemoptysis: Secondary | ICD-10-CM | POA: Diagnosis not present

## 2016-07-07 DIAGNOSIS — Z6834 Body mass index (BMI) 34.0-34.9, adult: Secondary | ICD-10-CM

## 2016-07-07 DIAGNOSIS — R2689 Other abnormalities of gait and mobility: Secondary | ICD-10-CM | POA: Diagnosis not present

## 2016-07-07 DIAGNOSIS — Z87891 Personal history of nicotine dependence: Secondary | ICD-10-CM

## 2016-07-07 DIAGNOSIS — N179 Acute kidney failure, unspecified: Principal | ICD-10-CM | POA: Diagnosis present

## 2016-07-07 DIAGNOSIS — E876 Hypokalemia: Secondary | ICD-10-CM | POA: Diagnosis not present

## 2016-07-07 DIAGNOSIS — E785 Hyperlipidemia, unspecified: Secondary | ICD-10-CM | POA: Diagnosis present

## 2016-07-07 DIAGNOSIS — M8580 Other specified disorders of bone density and structure, unspecified site: Secondary | ICD-10-CM | POA: Diagnosis present

## 2016-07-07 DIAGNOSIS — R072 Precordial pain: Secondary | ICD-10-CM | POA: Diagnosis not present

## 2016-07-07 DIAGNOSIS — K219 Gastro-esophageal reflux disease without esophagitis: Secondary | ICD-10-CM | POA: Diagnosis present

## 2016-07-07 DIAGNOSIS — E871 Hypo-osmolality and hyponatremia: Secondary | ICD-10-CM | POA: Diagnosis not present

## 2016-07-07 DIAGNOSIS — I482 Chronic atrial fibrillation: Secondary | ICD-10-CM | POA: Diagnosis not present

## 2016-07-07 DIAGNOSIS — I5043 Acute on chronic combined systolic (congestive) and diastolic (congestive) heart failure: Secondary | ICD-10-CM | POA: Diagnosis present

## 2016-07-07 DIAGNOSIS — E860 Dehydration: Secondary | ICD-10-CM | POA: Diagnosis present

## 2016-07-07 DIAGNOSIS — E039 Hypothyroidism, unspecified: Secondary | ICD-10-CM | POA: Diagnosis not present

## 2016-07-07 DIAGNOSIS — R05 Cough: Secondary | ICD-10-CM

## 2016-07-07 DIAGNOSIS — F17210 Nicotine dependence, cigarettes, uncomplicated: Secondary | ICD-10-CM | POA: Diagnosis present

## 2016-07-07 DIAGNOSIS — N189 Chronic kidney disease, unspecified: Secondary | ICD-10-CM | POA: Diagnosis not present

## 2016-07-07 DIAGNOSIS — I129 Hypertensive chronic kidney disease with stage 1 through stage 4 chronic kidney disease, or unspecified chronic kidney disease: Secondary | ICD-10-CM | POA: Diagnosis not present

## 2016-07-07 DIAGNOSIS — J8410 Pulmonary fibrosis, unspecified: Secondary | ICD-10-CM | POA: Diagnosis not present

## 2016-07-07 DIAGNOSIS — J439 Emphysema, unspecified: Secondary | ICD-10-CM | POA: Diagnosis present

## 2016-07-07 DIAGNOSIS — T380X1A Poisoning by glucocorticoids and synthetic analogues, accidental (unintentional), initial encounter: Secondary | ICD-10-CM | POA: Diagnosis not present

## 2016-07-07 DIAGNOSIS — R32 Unspecified urinary incontinence: Secondary | ICD-10-CM | POA: Diagnosis present

## 2016-07-07 DIAGNOSIS — J449 Chronic obstructive pulmonary disease, unspecified: Secondary | ICD-10-CM | POA: Diagnosis present

## 2016-07-07 DIAGNOSIS — R278 Other lack of coordination: Secondary | ICD-10-CM | POA: Diagnosis not present

## 2016-07-07 DIAGNOSIS — R410 Disorientation, unspecified: Secondary | ICD-10-CM | POA: Diagnosis not present

## 2016-07-07 DIAGNOSIS — J45909 Unspecified asthma, uncomplicated: Secondary | ICD-10-CM

## 2016-07-07 DIAGNOSIS — R9089 Other abnormal findings on diagnostic imaging of central nervous system: Secondary | ICD-10-CM

## 2016-07-07 DIAGNOSIS — Z7901 Long term (current) use of anticoagulants: Secondary | ICD-10-CM

## 2016-07-07 DIAGNOSIS — J9601 Acute respiratory failure with hypoxia: Secondary | ICD-10-CM | POA: Diagnosis not present

## 2016-07-07 DIAGNOSIS — J9809 Other diseases of bronchus, not elsewhere classified: Secondary | ICD-10-CM | POA: Diagnosis not present

## 2016-07-07 DIAGNOSIS — Z7982 Long term (current) use of aspirin: Secondary | ICD-10-CM

## 2016-07-07 DIAGNOSIS — I25110 Atherosclerotic heart disease of native coronary artery with unstable angina pectoris: Secondary | ICD-10-CM | POA: Diagnosis not present

## 2016-07-07 DIAGNOSIS — I10 Essential (primary) hypertension: Secondary | ICD-10-CM | POA: Diagnosis present

## 2016-07-07 DIAGNOSIS — I5021 Acute systolic (congestive) heart failure: Secondary | ICD-10-CM | POA: Diagnosis not present

## 2016-07-07 DIAGNOSIS — R2681 Unsteadiness on feet: Secondary | ICD-10-CM | POA: Diagnosis present

## 2016-07-07 DIAGNOSIS — M5124 Other intervertebral disc displacement, thoracic region: Secondary | ICD-10-CM | POA: Diagnosis not present

## 2016-07-07 DIAGNOSIS — Z7951 Long term (current) use of inhaled steroids: Secondary | ICD-10-CM

## 2016-07-07 DIAGNOSIS — Z9049 Acquired absence of other specified parts of digestive tract: Secondary | ICD-10-CM

## 2016-07-07 DIAGNOSIS — I272 Pulmonary hypertension, unspecified: Secondary | ICD-10-CM | POA: Diagnosis present

## 2016-07-07 DIAGNOSIS — I5030 Unspecified diastolic (congestive) heart failure: Secondary | ICD-10-CM | POA: Diagnosis not present

## 2016-07-07 DIAGNOSIS — I2510 Atherosclerotic heart disease of native coronary artery without angina pectoris: Secondary | ICD-10-CM | POA: Diagnosis not present

## 2016-07-07 DIAGNOSIS — F0390 Unspecified dementia without behavioral disturbance: Secondary | ICD-10-CM | POA: Diagnosis present

## 2016-07-07 DIAGNOSIS — Z5189 Encounter for other specified aftercare: Secondary | ICD-10-CM | POA: Diagnosis not present

## 2016-07-07 DIAGNOSIS — M48061 Spinal stenosis, lumbar region without neurogenic claudication: Secondary | ICD-10-CM | POA: Diagnosis not present

## 2016-07-07 DIAGNOSIS — G9340 Encephalopathy, unspecified: Secondary | ICD-10-CM | POA: Diagnosis present

## 2016-07-07 DIAGNOSIS — R413 Other amnesia: Secondary | ICD-10-CM

## 2016-07-07 DIAGNOSIS — T502X5A Adverse effect of carbonic-anhydrase inhibitors, benzothiadiazides and other diuretics, initial encounter: Secondary | ICD-10-CM | POA: Diagnosis present

## 2016-07-07 DIAGNOSIS — Z7952 Long term (current) use of systemic steroids: Secondary | ICD-10-CM

## 2016-07-07 DIAGNOSIS — M6281 Muscle weakness (generalized): Secondary | ICD-10-CM | POA: Diagnosis not present

## 2016-07-07 DIAGNOSIS — R1310 Dysphagia, unspecified: Secondary | ICD-10-CM | POA: Diagnosis not present

## 2016-07-07 DIAGNOSIS — Z888 Allergy status to other drugs, medicaments and biological substances status: Secondary | ICD-10-CM

## 2016-07-07 DIAGNOSIS — I130 Hypertensive heart and chronic kidney disease with heart failure and stage 1 through stage 4 chronic kidney disease, or unspecified chronic kidney disease: Secondary | ICD-10-CM | POA: Diagnosis not present

## 2016-07-07 DIAGNOSIS — R531 Weakness: Secondary | ICD-10-CM | POA: Diagnosis not present

## 2016-07-07 DIAGNOSIS — J9691 Respiratory failure, unspecified with hypoxia: Secondary | ICD-10-CM | POA: Diagnosis not present

## 2016-07-07 DIAGNOSIS — M4802 Spinal stenosis, cervical region: Secondary | ICD-10-CM | POA: Diagnosis not present

## 2016-07-07 DIAGNOSIS — N183 Chronic kidney disease, stage 3 (moderate): Secondary | ICD-10-CM | POA: Diagnosis not present

## 2016-07-07 DIAGNOSIS — I5042 Chronic combined systolic (congestive) and diastolic (congestive) heart failure: Secondary | ICD-10-CM

## 2016-07-07 DIAGNOSIS — I481 Persistent atrial fibrillation: Secondary | ICD-10-CM | POA: Diagnosis not present

## 2016-07-07 DIAGNOSIS — Z885 Allergy status to narcotic agent status: Secondary | ICD-10-CM

## 2016-07-07 DIAGNOSIS — M47816 Spondylosis without myelopathy or radiculopathy, lumbar region: Secondary | ICD-10-CM | POA: Diagnosis not present

## 2016-07-07 DIAGNOSIS — R0902 Hypoxemia: Secondary | ICD-10-CM

## 2016-07-07 DIAGNOSIS — R059 Cough, unspecified: Secondary | ICD-10-CM

## 2016-07-07 DIAGNOSIS — J810 Acute pulmonary edema: Secondary | ICD-10-CM | POA: Diagnosis not present

## 2016-07-07 DIAGNOSIS — G5793 Unspecified mononeuropathy of bilateral lower limbs: Secondary | ICD-10-CM

## 2016-07-07 DIAGNOSIS — J4540 Moderate persistent asthma, uncomplicated: Secondary | ICD-10-CM

## 2016-07-07 DIAGNOSIS — Z79899 Other long term (current) drug therapy: Secondary | ICD-10-CM

## 2016-07-07 DIAGNOSIS — R079 Chest pain, unspecified: Secondary | ICD-10-CM | POA: Diagnosis not present

## 2016-07-07 DIAGNOSIS — Z881 Allergy status to other antibiotic agents status: Secondary | ICD-10-CM

## 2016-07-07 DIAGNOSIS — E782 Mixed hyperlipidemia: Secondary | ICD-10-CM | POA: Diagnosis present

## 2016-07-07 DIAGNOSIS — I4891 Unspecified atrial fibrillation: Secondary | ICD-10-CM | POA: Diagnosis present

## 2016-07-07 LAB — AMMONIA: Ammonia: 26 umol/L (ref 9–35)

## 2016-07-07 LAB — CBC
HEMATOCRIT: 51.7 % — AB (ref 36.0–46.0)
Hemoglobin: 18.1 g/dL — ABNORMAL HIGH (ref 12.0–15.0)
MCH: 31.4 pg (ref 26.0–34.0)
MCHC: 35 g/dL (ref 30.0–36.0)
MCV: 89.8 fL (ref 78.0–100.0)
PLATELETS: 210 10*3/uL (ref 150–400)
RBC: 5.76 MIL/uL — AB (ref 3.87–5.11)
RDW: 15.4 % (ref 11.5–15.5)
WBC: 10.9 10*3/uL — ABNORMAL HIGH (ref 4.0–10.5)

## 2016-07-07 LAB — URINALYSIS, ROUTINE W REFLEX MICROSCOPIC
BILIRUBIN URINE: NEGATIVE
Glucose, UA: NEGATIVE mg/dL
HGB URINE DIPSTICK: NEGATIVE
KETONES UR: NEGATIVE mg/dL
Leukocytes, UA: NEGATIVE
NITRITE: NEGATIVE
PROTEIN: NEGATIVE mg/dL
SPECIFIC GRAVITY, URINE: 1.009 (ref 1.005–1.030)
pH: 7 (ref 5.0–8.0)

## 2016-07-07 LAB — BASIC METABOLIC PANEL
Anion gap: 16 — ABNORMAL HIGH (ref 5–15)
BUN: 56 mg/dL — AB (ref 6–20)
CHLORIDE: 79 mmol/L — AB (ref 101–111)
CO2: 34 mmol/L — AB (ref 22–32)
CREATININE: 2.11 mg/dL — AB (ref 0.44–1.00)
Calcium: 10.6 mg/dL — ABNORMAL HIGH (ref 8.9–10.3)
GFR calc non Af Amer: 22 mL/min — ABNORMAL LOW (ref >60)
GFR, EST AFRICAN AMERICAN: 25 mL/min — AB (ref >60)
Glucose, Bld: 100 mg/dL — ABNORMAL HIGH (ref 65–99)
POTASSIUM: 3.5 mmol/L (ref 3.5–5.1)
Sodium: 129 mmol/L — ABNORMAL LOW (ref 135–145)

## 2016-07-07 LAB — TROPONIN I: TROPONIN I: 0.08 ng/mL — AB (ref <0.03)

## 2016-07-07 LAB — PROTIME-INR
INR: 3.91
PROTHROMBIN TIME: 40.1 s — AB (ref 11.4–15.2)

## 2016-07-07 LAB — I-STAT TROPONIN, ED: Troponin i, poc: 0.1 ng/mL (ref 0.00–0.08)

## 2016-07-07 LAB — VITAMIN B12: Vitamin B-12: 529 pg/mL (ref 180–914)

## 2016-07-07 LAB — RAPID HIV SCREEN (HIV 1/2 AB+AG)
HIV 1/2 Antibodies: NONREACTIVE
HIV-1 P24 Antigen - HIV24: NONREACTIVE

## 2016-07-07 LAB — TSH: TSH: 6.06 u[IU]/mL — ABNORMAL HIGH (ref 0.350–4.500)

## 2016-07-07 LAB — BRAIN NATRIURETIC PEPTIDE: B Natriuretic Peptide: 60.2 pg/mL (ref 0.0–100.0)

## 2016-07-07 MED ORDER — GABAPENTIN 300 MG PO CAPS
300.0000 mg | ORAL_CAPSULE | Freq: Three times a day (TID) | ORAL | Status: DC
  Administered 2016-07-07 – 2016-07-11 (×11): 300 mg via ORAL
  Filled 2016-07-07 (×11): qty 1

## 2016-07-07 MED ORDER — ALPRAZOLAM 0.25 MG PO TABS
0.2500 mg | ORAL_TABLET | Freq: Two times a day (BID) | ORAL | Status: DC | PRN
  Administered 2016-07-07: 0.25 mg via ORAL
  Filled 2016-07-07: qty 1

## 2016-07-07 MED ORDER — IPRATROPIUM-ALBUTEROL 0.5-2.5 (3) MG/3ML IN SOLN
3.0000 mL | Freq: Four times a day (QID) | RESPIRATORY_TRACT | Status: DC
  Administered 2016-07-07 – 2016-07-08 (×4): 3 mL via RESPIRATORY_TRACT
  Filled 2016-07-07 (×5): qty 3

## 2016-07-07 MED ORDER — LEVOTHYROXINE SODIUM 50 MCG PO TABS
50.0000 ug | ORAL_TABLET | Freq: Every day | ORAL | Status: DC
  Administered 2016-07-08 – 2016-07-16 (×9): 50 ug via ORAL
  Filled 2016-07-07 (×10): qty 1

## 2016-07-07 MED ORDER — ACETAMINOPHEN 650 MG RE SUPP
650.0000 mg | Freq: Four times a day (QID) | RECTAL | Status: DC | PRN
  Filled 2016-07-07: qty 1

## 2016-07-07 MED ORDER — SODIUM CHLORIDE 0.9 % IV SOLN
INTRAVENOUS | Status: DC
  Administered 2016-07-07: via INTRAVENOUS
  Administered 2016-07-08: 1000 mL via INTRAVENOUS
  Administered 2016-07-09 (×2): via INTRAVENOUS
  Administered 2016-07-10: 60 mL/h via INTRAVENOUS

## 2016-07-07 MED ORDER — SODIUM CHLORIDE 0.9 % IV BOLUS (SEPSIS)
500.0000 mL | Freq: Once | INTRAVENOUS | Status: AC
  Administered 2016-07-07: 500 mL via INTRAVENOUS

## 2016-07-07 MED ORDER — MONTELUKAST SODIUM 10 MG PO TABS
10.0000 mg | ORAL_TABLET | Freq: Every day | ORAL | Status: DC
Start: 2016-07-07 — End: 2016-07-16
  Administered 2016-07-07 – 2016-07-15 (×9): 10 mg via ORAL
  Filled 2016-07-07 (×10): qty 1

## 2016-07-07 MED ORDER — DIGOXIN 125 MCG PO TABS
125.0000 ug | ORAL_TABLET | Freq: Every day | ORAL | Status: DC
  Administered 2016-07-09 – 2016-07-10 (×2): 125 ug via ORAL
  Filled 2016-07-07 (×2): qty 1

## 2016-07-07 MED ORDER — PRAVASTATIN SODIUM 40 MG PO TABS
40.0000 mg | ORAL_TABLET | Freq: Every day | ORAL | Status: DC
  Administered 2016-07-08 – 2016-07-15 (×8): 40 mg via ORAL
  Filled 2016-07-07 (×8): qty 1

## 2016-07-07 MED ORDER — BUPROPION HCL ER (XL) 150 MG PO TB24
150.0000 mg | ORAL_TABLET | ORAL | Status: DC
  Administered 2016-07-09 – 2016-07-12 (×3): 150 mg via ORAL
  Filled 2016-07-07 (×5): qty 1

## 2016-07-07 MED ORDER — ONDANSETRON HCL 4 MG/2ML IJ SOLN: 4.0000 mg | Freq: Four times a day (QID) | INTRAMUSCULAR | Status: DC | PRN

## 2016-07-07 MED ORDER — AZELASTINE HCL 0.1 % NA SOLN: 2.0000 | Freq: Every evening | NASAL | Status: DC | PRN

## 2016-07-07 MED ORDER — ALLOPURINOL 300 MG PO TABS
150.0000 mg | ORAL_TABLET | Freq: Every day | ORAL | Status: DC
  Administered 2016-07-08 – 2016-07-16 (×9): 150 mg via ORAL
  Filled 2016-07-07 (×9): qty 1

## 2016-07-07 MED ORDER — BUDESONIDE 0.25 MG/2ML IN SUSP
0.2500 mg | Freq: Two times a day (BID) | RESPIRATORY_TRACT | Status: DC
  Administered 2016-07-08 – 2016-07-16 (×15): 0.25 mg via RESPIRATORY_TRACT
  Filled 2016-07-07 (×16): qty 2

## 2016-07-07 MED ORDER — ZOLPIDEM TARTRATE 5 MG PO TABS: 5.0000 mg | ORAL_TABLET | Freq: Once | ORAL | Status: DC

## 2016-07-07 MED ORDER — FLUTICASONE-UMECLIDIN-VILANT 100-62.5-25 MCG/INH IN AEPB: 1.0000 | INHALATION_SPRAY | Freq: Every day | RESPIRATORY_TRACT | Status: DC

## 2016-07-07 MED ORDER — ASPIRIN EC 81 MG PO TBEC
81.0000 mg | DELAYED_RELEASE_TABLET | Freq: Every day | ORAL | Status: DC
  Administered 2016-07-08 – 2016-07-16 (×9): 81 mg via ORAL
  Filled 2016-07-07 (×9): qty 1

## 2016-07-07 MED ORDER — ASPIRIN 81 MG PO CHEW
324.0000 mg | CHEWABLE_TABLET | Freq: Once | ORAL | Status: AC
  Administered 2016-07-07: 324 mg via ORAL
  Filled 2016-07-07: qty 4

## 2016-07-07 MED ORDER — HYDROCODONE-ACETAMINOPHEN 5-325 MG PO TABS
1.0000 | ORAL_TABLET | ORAL | Status: DC | PRN
  Administered 2016-07-08 (×3): 1 via ORAL
  Filled 2016-07-07 (×5): qty 1

## 2016-07-07 MED ORDER — HYPROMELLOSE (GONIOSCOPIC) 2.5 % OP SOLN
1.0000 [drp] | Freq: Four times a day (QID) | OPHTHALMIC | Status: DC | PRN
  Administered 2016-07-15: 1 [drp] via OPHTHALMIC
  Filled 2016-07-07: qty 15

## 2016-07-07 MED ORDER — DILTIAZEM HCL ER COATED BEADS 120 MG PO CP24
120.0000 mg | ORAL_CAPSULE | Freq: Every day | ORAL | Status: DC
  Administered 2016-07-08: 120 mg via ORAL
  Filled 2016-07-07: qty 1

## 2016-07-07 MED ORDER — UMECLIDINIUM-VILANTEROL 62.5-25 MCG/INH IN AEPB
1.0000 | INHALATION_SPRAY | Freq: Every day | RESPIRATORY_TRACT | Status: DC
  Administered 2016-07-08 – 2016-07-16 (×8): 1 via RESPIRATORY_TRACT
  Filled 2016-07-07 (×2): qty 14

## 2016-07-07 MED ORDER — ONDANSETRON HCL 4 MG PO TABS: 4.0000 mg | ORAL_TABLET | Freq: Four times a day (QID) | ORAL | Status: DC | PRN

## 2016-07-07 MED ORDER — POLYETHYLENE GLYCOL 3350 17 G PO PACK
17.0000 g | PACK | Freq: Every day | ORAL | Status: DC | PRN
  Administered 2016-07-07 – 2016-07-08 (×2): 17 g via ORAL
  Filled 2016-07-07 (×2): qty 1

## 2016-07-07 MED ORDER — ACETAMINOPHEN 325 MG PO TABS
650.0000 mg | ORAL_TABLET | Freq: Four times a day (QID) | ORAL | Status: DC | PRN
  Administered 2016-07-09 – 2016-07-16 (×13): 650 mg via ORAL
  Filled 2016-07-07 (×14): qty 2

## 2016-07-07 NOTE — ED Notes (Signed)
Gave report to [employer] on neuro tele floor, RN receiving asking if patient appropriate for floor d/t troponin. This RN spoke with Dr. Marily Memos and MD states patient is appropriate for neuro tele floor.

## 2016-07-07 NOTE — Progress Notes (Signed)
ANTICOAGULATION CONSULT NOTE - Initial Consult  Pharmacy Consult for warfarin Indication: atrial fibrillation  Allergies  Allergen Reactions  . Amiodarone Other (See Comments)    PULMONARY TOXICITY  . Diovan [Valsartan] Other (See Comments)    HYPOTENSION  . Doxycycline Diarrhea and Other (See Comments)    VISUAL DISTURBANCE  . Flexeril [Cyclobenzaprine] Other (See Comments)    FATIGUE  . Keflex [Cephalexin] Diarrhea  . Verapamil Other (See Comments)    EDEMA  . Advair Diskus [Fluticasone-Salmeterol] Other (See Comments)    SKIN CHANGES.  Can tolerate low dose of prednisone without complications  . Codeine Other (See Comments)    unknown    Patient Measurements: Height: 5\' 3"  (160 cm) Weight: 193 lb (87.5 kg) IBW/kg (Calculated) : 52.4  Vital Signs: Temp: 97.8 F (36.6 C) (04/25 1309) Temp Source: Oral (04/25 1309) BP: 132/53 (04/25 1715) Pulse Rate: 61 (04/25 1712)  Labs:  Recent Labs  07/07/16 1512  HGB 18.1*  HCT 51.7*  PLT 210  LABPROT 40.1*  INR 3.91  CREATININE 2.11*    Estimated Creatinine Clearance: 24.5 mL/min (A) (by C-G formula based on SCr of 2.11 mg/dL (H)).   Medical History: Past Medical History:  Diagnosis Date  . Atrial fibrillation ([place])   . Migraine headache   . Osteopenia     Assessment: 76 yo female on warfarin prior to arrival for atrial fibrillation admitted 4/25 with CP and confusion. Admit INR 3.91 with last dose of warfarin taken earlier today. CBC stable.     Goal of Therapy:  INR 2-3 Monitor platelets by anticoagulation protocol: Yes   Plan:  1. Hold warfarin this evening 2. Daily INR  Vincenza Hews, PharmD, BCPS 07/07/2016, 6:20 PM

## 2016-07-07 NOTE — H&P (Signed)
History and Physical    Katie [name] WCB:[id number] DOB: 05-23-1940 DOA: 07/07/2016  PCP: Katie Richards, MD   Patient coming from: home  Chief Complaint:   HPI: Katie [name] is a 76 y.o. female with medical history significant of  CAD, diastolic CHF, normal aortic aneurysm, morbid obesity, CK D stage III, pulmonary fibrosis as well as sequela of amiodarone therapy, atrial fibrillation, hypothyroidism, COPD, hypertension, GERD who presented to the emergency department with complaints of chest pain, HA  and confusion. She has been on increased diuretic therapy with Lasix 40 mg twice a day and Zaroxolyn 2.5 mg; she lost nearly 8 pounds and on 07/06/2016 the PCP discontinued Zaroxolyn.  Patient was also having difficulties with lower back and sciatica pain and started on prednisone. However she developed difficulty sleeping at night and the PCP recommended she take Benadryl which did not help, after which she was prescribed Xanax.  Today she presented to the ED with confusion and her daughter said that over the past few months her mother had difficulties finding words, she became more forgetful and called her neighbor to ask what is her phone number although she had for the last 50 years. She also had an episode when she was found having a telephone in her hand and saying that she does not know how to use it . Since 4:30 AM this morning she behaved not like herself, was confused and had an episode of urinary incontinence. Her daughter who is at the bedside reported that her mother has been having increasing in intensity gait and during the last week started using cane and eventually progressed to a walker  ED Course: On arrival to the ED her blood pressure and pulse were normal, she was afebrile. Blood work demonstrated mild hypokalemia with sodium of 129 and she was given 500 mL bolus of normal saline. She also had elevated creatinine at 2.11 and BUN 56 with baseline creatinine is  1.25-1.45 INR 3.91 Troponin was slightly elevated at 0.10 Chest x-ray showed stable cardiomegaly and hyper hyperhydration, no active lung disease EKG showed atrial fibrillation with controlled ventricular rate and occasional PVC, left BBB type tracing   Review of Systems: As per HPI otherwise 10 point review of systems negative.   Ambulatory Status:uses cane or walker  Past Medical History:  Diagnosis Date  . Atrial fibrillation ([place])   . Migraine headache   . Osteopenia     Past Surgical History:  Procedure Laterality Date  . ABDOMINAL AORTIC ENDOVASCULAR STENT GRAFT N/A 04/11/2013   Procedure: ABDOMINAL AORTIC ENDOVASCULAR STENT GRAFT WITH RIGHT FEMORAL PATCH ANGIOPLASTY;  Surgeon: Katie Misty, MD;  Location: [facility];  Service: Vascular;  Laterality: N/A;  . BREAST SURGERY     LEFT BREAST BIOPSY  . broken leg Left 1970s  . CARDIAC CATHETERIZATION    . CHOLECYSTECTOMY  1972  . COLONOSCOPY    . EYE SURGERY Bilateral   . Laser vein procedure    . REFRACTIVE SURGERY    . TONSILLECTOMY      Social History   Social History  . Marital status: Married    Spouse name: N/A  . Number of children: N/A  . Years of education: N/A   Occupational History  . Not on file.   Social History Main Topics  . Smoking status: Former Smoker    Types: Cigarettes    Quit date: 08/10/2002  . Smokeless tobacco: Never Used     Comment: History of tobacco abuse  .  Alcohol use 0.0 oz/week     Comment: rare  . Drug use: No  . Sexual activity: Yes    Birth control/ protection: Post-menopausal   Other Topics Concern  . Not on file   Social History Narrative   [place]   One daughter    Allergies  Allergen Reactions  . Amiodarone Other (See Comments)    PULMONARY TOXICITY  . Diovan [Valsartan] Other (See Comments)    HYPOTENSION  . Doxycycline Diarrhea and Other (See Comments)    VISUAL DISTURBANCE  . Flexeril [Cyclobenzaprine] Other (See Comments)     FATIGUE  . Keflex [Cephalexin] Diarrhea  . Verapamil Other (See Comments)    EDEMA  . Advair Diskus [Fluticasone-Salmeterol] Other (See Comments)    SKIN CHANGES.  Can tolerate low dose of prednisone without complications  . Codeine Other (See Comments)    unknown    Family History  Problem Relation Age of Onset  . Cirrhosis Mother   . Cancer Mother 46    PANCREAS  . Heart defect Sister   . Breast cancer Sister     age 25  . Heart disease Sister   . Stroke Sister   . Alcohol abuse Father   . Depression Father   . Hypertension Brother   . Hyperlipidemia Son   . Heart disease Daughter     Prior to Admission medications   Medication Sig Start Date End Date Taking? Authorizing Provider  acetaminophen (TYLENOL) 500 MG tablet Take 1,000 mg by mouth 2 (two) times daily as needed for moderate pain.   Yes Historical Provider, MD  albuterol (PROVENTIL HFA;VENTOLIN HFA) 108 (90 BASE) MCG/ACT inhaler 2 puffs 5 minutes apart every 4 to 6 hours as needed to rescue asthma Patient taking differently: Inhale 2 puffs into the lungs every 4 (four) hours as needed for shortness of breath. Inhale 2 puffs into the lungs 5 minutes apart every 4 to 6 hours as needed to rescue asthma 08/13/13 07/07/16 Yes Katie Smith, PA-C  allopurinol (ZYLOPRIM) 300 MG tablet TAKE 1 TABLET BY MOUTH EVERY DAY 05/25/16  Yes Katie Pinto, MD  ALPRAZolam Duanne Moron) 0.5 MG tablet Take 1/2 to 1 tablet tid PRN for anxiety. 07/05/16  Yes Katie Pinto, MD  aspirin 81 MG tablet Take 81 mg by mouth daily.     Yes Historical Provider, MD  azelastine (ASTELIN) 0.1 % nasal spray Place 2 sprays into both nostrils 2 (two) times daily. Use in each nostril as directed Patient taking differently: Place 2 sprays into both nostrils at bedtime as needed for rhinitis or allergies. Use in each nostril as directed 09/04/15  Yes Katie Mutters, PA-C  buPROPion (WELLBUTRIN XL) 150 MG 24 hr tablet Take 1 tablet (150 mg total) by mouth every  morning. 05/13/16 05/13/17 Yes Katie Mutters, PA-C  cetirizine (ZYRTEC) 10 MG tablet TAKE 1 CAPSULE (10 MG TOTAL) BY MOUTH AT BEDTIME. 05/02/16  Yes Katie Pinto, MD  digoxin (LANOXIN) 0.25 MG tablet TAKE 1 TABLET BY MOUTH DAILY Patient taking differently: TAKE 1/2 TABLET BY MOUTH DAILY 01/23/16  Yes Katie Pinto, MD  diltiazem (CARDIZEM CD) 120 MG 24 hr capsule Take 1 capsule (120 mg total) by mouth daily. 03/17/16  Yes Katie Pinto, MD  Fluticasone-Umeclidin-Vilant (TRELEGY ELLIPTA) 100-62.5-25 MCG/INH AEPB Inhale 1 puff into the lungs daily. 05/13/16  Yes Katie Mutters, PA-C  furosemide (LASIX) 80 MG tablet TAKE 1 TABLET BY MOUTH TWICE A DAY AS NEEDED FOR FLUID Patient taking differently: 40mg  twice  daily 05/25/16  Yes Katie Pinto, MD  gabapentin (NEURONTIN) 300 MG capsule TAKE ONE CAPSULE BY MOUTH 3 TIMES A DAY 05/07/16  Yes Katie Mutters, PA-C  hydroxypropyl methylcellulose / hypromellose (ISOPTO TEARS / GONIOVISC) 2.5 % ophthalmic solution Place 1 drop into both eyes 4 (four) times daily as needed for dry eyes.    Yes Historical Provider, MD  ipratropium-albuterol (DUONEB) 0.5-2.5 (3) MG/3ML SOLN Inhale 3 ml 4 x/ day or every 4 hours for chronic asthma 06/16/16 12/16/16 Yes Katie Pinto, MD  levothyroxine (SYNTHROID, LEVOTHROID) 50 MCG tablet TAKE 1 TABLET EVERY DAY Patient taking differently: TAKE 50mcg TABLET EVERY DAY 03/24/16  Yes Katie Pinto, MD  meclizine (ANTIVERT) 25 MG tablet Take 1 tablet (25 mg total) by mouth 3 (three) times daily as needed for dizziness. 04/18/15  Yes Courtney Forcucci, PA-C  metolazone (ZAROXOLYN) 2.5 MG tablet TAKE 1 TABLET (2.5 MG TOTAL) BY MOUTH DAILY. 06/18/16  Yes Katie Pinto, MD  montelukast (SINGULAIR) 10 MG tablet TAKE 1 TABLET EVERY DAY FOR ALLERGIES 05/12/16  Yes Katie Pinto, MD  potassium chloride SA (KLOR-CON M20) 20 MEQ tablet Take 2 tablets (40 mEq total) by mouth daily. 04/20/16  Yes Katie Mutters, PA-C  pravastatin (PRAVACHOL) 40 MG  tablet TAKE 1 TABLET BY MOUTH AT BEDTIME FOR CHOLESTEROL 06/30/16  Yes Katie Pinto, MD  prednisoLONE acetate (PRED FORTE) 1 % ophthalmic suspension INSTILL 1 DROP INTO RIGHT EYE once daily 05/12/16  Yes Historical Provider, MD  prochlorperazine (COMPAZINE) 5 MG tablet TAKE 1 TABLET BY MOUTH 3 TIMES A DAY FOR VERTIGO OR NAUSEA 09/16/15  Yes Katie Pinto, MD  warfarin (COUMADIN) 2 MG tablet TAKE 1 TO 2 TABLETS BY MOUTH DAILY OR AS DIRECTED Patient taking differently: 3mg  daily 04/21/16  Yes Katie Mutters, PA-C  albuterol (PROVENTIL) (2.5 MG/3ML) 0.083% nebulizer solution Take 3 mLs (2.5 mg total) by nebulization every 6 (six) hours as needed for wheezing or shortness of breath. Patient not taking: Reported on 07/07/2016 04/12/16   Starlyn Skeans, PA-C  levalbuterol (XOPENEX) 1.25 MG/0.5ML nebulizer solution Take 1.25 mg by nebulization every 6 (six) hours as needed for wheezing or shortness of breath. Patient not taking: Reported on 07/07/2016 12/30/15   Theodis Blaze, MD  predniSONE (DELTASONE) 20 MG tablet 1 tab 3 x day for 3 days, then 1 tab 2 x day for 3 days, then 1 tab 1 x day for 5 days 06/30/16   Katie Pinto, MD    Physical Exam: Vitals:   07/07/16 1515 07/07/16 1530 07/07/16 1615 07/07/16 1712  BP: (!) 108/47 121/61 122/75 128/88  Pulse:   69 61  Resp: 13 17 13 18   Temp:      TempSrc:      SpO2:   98% 98%  Weight:      Height:         General: Appears calm and comfortable Eyes: PERRLA, EOMI, normal lids, iris ENT:  grossly normal hearing, lips & tongue, mucous membranes moist and intact Neck: no lymphoadenopathy, masses or thyromegaly Cardiovascular: irregularly irregular, no m/r/g. No JVD, carotid bruits. No LE edema.  Respiratory: bilateral no wheezes, rales, rhonchi or cracles. Normal respiratory effort. No accessory muscle use observed Abdomen: soft, non-tender, non-distended, no organomegaly or masses appreciated. BS present in all quadrants Skin: no rash, ulcers or  induration seen on limited exam Musculoskeletal: grossly normal tone BUE/BLE, good ROM, no bony abnormality or joint deformities observed Psychiatric: grossly normal mood and affect, speech fluent and appropriate, alert and oriented  x3 Neurologic: CN II-XII grossly intact, moves all extremities in coordinated fashion, sensation intact  Labs on Admission: I have personally reviewed following labs and imaging studies  CBC, BMP  GFR: Estimated Creatinine Clearance: 24.5 mL/min (A) (by C-G formula based on SCr of 2.11 mg/dL (H)).   Creatinine Clearance: Estimated Creatinine Clearance: 24.5 mL/min (A) (by C-G formula based on SCr of 2.11 mg/dL (H)).   Radiological Exams on Admission: Dg Chest 2 View  Result Date: 07/07/2016 CLINICAL DATA:  Mid chest pain, dizziness, low blood pressure chest x-ray 411 1,018 EXAM: CHEST  2 VIEW COMPARISON:  None. FINDINGS: The lungs remain clear and somewhat hyperaerated. Mediastinal and hilar contours are unremarkable. Mild cardiomegaly is stable. No acute bony abnormality is seen. IMPRESSION: Stable cardiomegaly and hyperaeration.  No active lung disease. Electronically Signed   By: Ivar Drape M.D.   On: 07/07/2016 13:41    EKG: Independently reviewed -  atrial fibrillation with controlled ventricular rate and occasional PVC, left BBB type tracing    Assessment/Plan Principal Problem:   Acute encephalopathy Active Problems:   Essential hypertension   Atrial fibrillation (HCC)   Congestive heart failure (HCC)   COPD (chronic obstructive pulmonary disease) with chronic bronchitis (HCC)   Hyperlipidemia   Chest pain   Hyponatremia   Acute kidney injury superimposed on chronic kidney disease (HCC)   Acute Encephalopathy  Given her confusion, unsteady gait, difficulty finding words will cancel CT and proceed with MRI w/o contrast  Check ammonia level, TSH, B12 and folate level, RPR level, HIV She has normal UA and chest Xray did not suggest any  infection  Chest pain in patient with known CAD Continue cycle troponin - the first troponin was elevated at 0.10 Monitor on telemetry Continue aspirin  Hyponatremia - patient was given a bolus of 500 cc NS IV Will continue gentle hydration and follow the level  Atrial fibrillation - rate controlled, continue Continue Cardizem, digoxin, Coumadin per pharmacy  AKI superimposed on CKD stage III - hsi baseline s creatinine is 1.25-1.45 Today her creatinine is 2.11  Will continue gentle hydration and recheck tomorrow. Adjust meds to renal - Zyloprim - from 300 mg to 150 mg  CHF - EKG from October 2017 demonstrated LV ejection fraction 60-65%, mild pulmonary hypertension  We will hold diuretic therapy right now is patient looks somewhat   Hyperlipidemia - continue Pravachol  COPD with asthmatic bronchitis - continue hope nebs and Singular  Hypothyroidism - continue Synthroid  DVT prophylaxis: Coumadin Code Status: full Family Communication: at bedside Disposition Plan: telemetry Consults called: none Admission status: inpatient   Katie [name], [place] Pager: [phone] [facility]  If 7PM-7AM, please contact night-coverage [email or web address] Password [facility]  07/07/2016, 5:38 PM

## 2016-07-07 NOTE — ED Notes (Signed)
Patient went to MRI.

## 2016-07-07 NOTE — ED Provider Notes (Signed)
[facility] DEPT Provider Note   CSN: [id number] Arrival date & time: 07/07/16  1303     History   Chief Complaint No chief complaint on file.   HPI Katie [name] is a 76 y.o. female.  HPI   76 yo F with PMHx AFib, HTN, HLD, CHF here with AMS. Pt states that her sx started after a PCP visit last week for back pain/sciatica. She was prescribed prednisone for this and took this for two days, after which she became increasingly confused and unable to sleep. She notified her MD who told her to take benadryl, which only made her more confused. He then called in Xanax which pt took btu had no improvement in her ability to sleep, anxiety, or her mental status. She has also had decreasing appetite and has lost 8 lb in the past week. She was previously/recently on large doses of lasix and metolazone and is below her normal weight. Denies fevers. SHe has a mild headache and tremor, as well as gait difficulty. No recent falls. No cough or spoutum production.  Past Medical History:  Diagnosis Date  . Atrial fibrillation ([place])   . Migraine headache   . Osteopenia     Patient Active Problem List   Diagnosis Date Noted  . Acute encephalopathy 07/07/2016  . Chest pain 07/07/2016  . Hyponatremia 07/07/2016  . Acute kidney injury superimposed on chronic kidney disease ([place]) 07/07/2016  . Acute respiratory failure with hypoxia ([place]) 07/07/2016  . Hypotension 02/03/2016  . Acute diastolic congestive heart failure ([place]) 12/25/2015  . Other abnormal glucose 09/19/2015  . AAA (abdominal aortic aneurysm) without rupture ([place]) 04/22/2015  . Morbid obesity due to excess calories ([place]) 08/03/2014  . PVD (peripheral vascular disease) with claudication ([place]) 10/16/2013  . CKD  stage III (GFR 51 ml/min) 06/08/2013  . Hyperlipidemia 06/08/2013  . Medication management 06/08/2013  . 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 Long-term (current) use of anticoagulants 04/23/2013  .  Vitamin D deficiency 02/15/2013  . Hypothyroidism   . Osteopenia   . Congestive heart failure ([place]) 11/25/2008  . Migraine headache 11/22/2008  . Essential hypertension 11/22/2008  . Coronary atherosclerosis 11/22/2008  . Atrial fibrillation ([place]) 11/22/2008  . COPD (chronic obstructive pulmonary disease) with chronic bronchitis ([place]) 11/22/2008  . GERD 11/22/2008    Past Surgical History:  Procedure Laterality Date  . ABDOMINAL AORTIC ENDOVASCULAR STENT GRAFT N/A 04/11/2013   Procedure: ABDOMINAL AORTIC ENDOVASCULAR STENT GRAFT WITH RIGHT FEMORAL PATCH ANGIOPLASTY;  Surgeon: Mal Misty, MD;  Location: [facility];  Service: Vascular;  Laterality: N/A;  . BREAST SURGERY     LEFT BREAST BIOPSY  . broken leg Left 1970s  . CARDIAC CATHETERIZATION    . CHOLECYSTECTOMY  1972  . COLONOSCOPY    . EYE SURGERY Bilateral   . Laser vein procedure    . REFRACTIVE SURGERY    . TONSILLECTOMY      OB History    Gravida Para Term Preterm AB Living   4 3 3   1 3    SAB TAB Ectopic Multiple Live Births                   Home Medications    Prior to Admission medications   Medication Sig Start Date End Date Taking? Authorizing Provider  acetaminophen (TYLENOL) 500 MG tablet Take 1,000 mg by mouth 2 (two) times daily as needed for moderate pain.   Yes Historical Provider, MD  albuterol (PROVENTIL HFA;VENTOLIN  HFA) 108 (90 BASE) MCG/ACT inhaler 2 puffs 5 minutes apart every 4 to 6 hours as needed to rescue asthma Patient taking differently: Inhale 2 puffs into the lungs every 4 (four) hours as needed for shortness of breath. Inhale 2 puffs into the lungs 5 minutes apart every 4 to 6 hours as needed to rescue asthma 08/13/13 07/07/16 Yes Melissa Smith, PA-C  allopurinol (ZYLOPRIM) 300 MG tablet TAKE 1 TABLET BY MOUTH EVERY DAY 05/25/16  Yes [name], MD  ALPRAZolam Duanne Moron) 0.5 MG tablet Take 1/2 to 1 tablet tid PRN for anxiety. 07/05/16  Yes [name], MD  aspirin 81 MG tablet Take 81 mg  by mouth daily.     Yes Historical Provider, MD  azelastine (ASTELIN) 0.1 % nasal spray Place 2 sprays into both nostrils 2 (two) times daily. Use in each nostril as directed Patient taking differently: Place 2 sprays into both nostrils at bedtime as needed for rhinitis or allergies. Use in each nostril as directed 09/04/15  Yes Vicie Mutters, PA-C  buPROPion (WELLBUTRIN XL) 150 MG 24 hr tablet Take 1 tablet (150 mg total) by mouth every morning. 05/13/16 05/13/17 Yes Vicie Mutters, PA-C  cetirizine (ZYRTEC) 10 MG tablet TAKE 1 CAPSULE (10 MG TOTAL) BY MOUTH AT BEDTIME. 05/02/16  Yes [name], MD  digoxin (LANOXIN) 0.25 MG tablet TAKE 1 TABLET BY MOUTH DAILY Patient taking differently: TAKE 1/2 TABLET BY MOUTH DAILY 01/23/16  Yes [name], MD  diltiazem (CARDIZEM CD) 120 MG 24 hr capsule Take 1 capsule (120 mg total) by mouth daily. 03/17/16  Yes [name], MD  Fluticasone-Umeclidin-Vilant (TRELEGY ELLIPTA) 100-62.5-25 MCG/INH AEPB Inhale 1 puff into the lungs daily. 05/13/16  Yes Vicie Mutters, PA-C  furosemide (LASIX) 80 MG tablet TAKE 1 TABLET BY MOUTH TWICE A DAY AS NEEDED FOR FLUID Patient taking differently: 40mg  twice daily 05/25/16  Yes [name], MD  gabapentin (NEURONTIN) 300 MG capsule TAKE ONE CAPSULE BY MOUTH 3 TIMES A DAY 05/07/16  Yes Vicie Mutters, PA-C  hydroxypropyl methylcellulose / hypromellose (ISOPTO TEARS / GONIOVISC) 2.5 % ophthalmic solution Place 1 drop into both eyes 4 (four) times daily as needed for dry eyes.    Yes Historical Provider, MD  ipratropium-albuterol (DUONEB) 0.5-2.5 (3) MG/3ML SOLN Inhale 3 ml 4 x/ day or every 4 hours for chronic asthma 06/16/16 12/16/16 Yes [name], MD  levothyroxine (SYNTHROID, LEVOTHROID) 50 MCG tablet TAKE 1 TABLET EVERY DAY Patient taking differently: TAKE 4mcg TABLET EVERY DAY 03/24/16  Yes [name], MD  meclizine (ANTIVERT) 25 MG tablet Take 1 tablet (25 mg total) by mouth 3 (three) times daily as needed  for dizziness. 04/18/15  Yes Courtney Forcucci, PA-C  metolazone (ZAROXOLYN) 2.5 MG tablet TAKE 1 TABLET (2.5 MG TOTAL) BY MOUTH DAILY. 06/18/16  Yes [name], MD  montelukast (SINGULAIR) 10 MG tablet TAKE 1 TABLET EVERY DAY FOR ALLERGIES 05/12/16  Yes [name], MD  potassium chloride SA (KLOR-CON M20) 20 MEQ tablet Take 2 tablets (40 mEq total) by mouth daily. 04/20/16  Yes Vicie Mutters, PA-C  pravastatin (PRAVACHOL) 40 MG tablet TAKE 1 TABLET BY MOUTH AT BEDTIME FOR CHOLESTEROL 06/30/16  Yes [name], MD  prednisoLONE acetate (PRED FORTE) 1 % ophthalmic suspension INSTILL 1 DROP INTO RIGHT EYE once daily 05/12/16  Yes Historical Provider, MD  prochlorperazine (COMPAZINE) 5 MG tablet TAKE 1 TABLET BY MOUTH 3 TIMES A DAY FOR VERTIGO OR NAUSEA 09/16/15  Yes [name], MD  warfarin (COUMADIN) 2 MG  tablet TAKE 1 TO 2 TABLETS BY MOUTH DAILY OR AS DIRECTED Patient taking differently: 3mg  daily 04/21/16  Yes Vicie Mutters, PA-C  predniSONE (DELTASONE) 20 MG tablet 1 tab 3 x day for 3 days, then 1 tab 2 x day for 3 days, then 1 tab 1 x day for 5 days 06/30/16   [name], MD    Family History Family History  Problem Relation Age of Onset  . Cirrhosis Mother   . Cancer Mother 55    PANCREAS  . Heart defect Sister   . Breast cancer Sister     age 73  . Heart disease Sister   . Stroke Sister   . Alcohol abuse Father   . Depression Father   . Hypertension Brother   . Hyperlipidemia Son   . Heart disease Daughter     Social History Social History  Substance Use Topics  . Smoking status: Former Smoker    Types: Cigarettes    Quit date: 08/10/2002  . Smokeless tobacco: Never Used     Comment: History of tobacco abuse  . Alcohol use 0.0 oz/week     Comment: rare     Allergies   Amiodarone; Diovan [valsartan]; Doxycycline; Flexeril [cyclobenzaprine]; Keflex [cephalexin]; Verapamil; Advair diskus [fluticasone-salmeterol]; and Codeine   Review of Systems Review of  Systems  Constitutional: Positive for fatigue.  Respiratory: Positive for chest tightness.   Gastrointestinal: Positive for nausea.  Neurological: Positive for weakness, light-headedness and headaches.  All other systems reviewed and are negative.    Physical Exam Updated Vital Signs BP (!) 139/45   Pulse 61   Temp 97.8 F (36.6 C) (Oral)   Resp 15   Ht 5\' 3"  (1.6 m)   Wt 193 lb (87.5 kg)   SpO2 98%   BMI 34.19 kg/m   Physical Exam  Constitutional: She appears well-developed and well-nourished. No distress.  HENT:  Head: Normocephalic and atraumatic.  Markedly dry MM  Eyes: Conjunctivae are normal.  Neck: Neck supple.  Cardiovascular: Normal rate, regular rhythm and normal heart sounds.  Exam reveals no friction rub.   No murmur heard. Pulmonary/Chest: Effort normal and breath sounds normal. No respiratory distress. She has no wheezes. She has no rales.  Abdominal: She exhibits no distension.  Musculoskeletal: She exhibits no edema.  Neurological: She is alert. She exhibits normal muscle tone.  Intermittently confused, oriented to person and place but not time. Intermittently loses focus/attention. MAE with 5/5 strength. Endorses normal sensation to light touch b/l UE and LE.  Skin: Skin is warm. Capillary refill takes less than 2 seconds.  Psychiatric: She has a normal mood and affect.  Nursing note and vitals reviewed.    ED Treatments / Results  Labs (all labs ordered are listed, but only abnormal results are displayed) Labs Reviewed  BASIC METABOLIC PANEL - Abnormal; Notable for the following:       Result Value   Sodium 129 (*)    Chloride 79 (*)    CO2 34 (*)    Glucose, Bld 100 (*)    BUN 56 (*)    Creatinine, Ser 2.11 (*)    Calcium 10.6 (*)    GFR calc non Af Amer 22 (*)    GFR calc Af Amer 25 (*)    Anion gap 16 (*)    All other components within normal limits  CBC - Abnormal; Notable for the following:    WBC 10.9 (*)    RBC 5.76 (*)  Hemoglobin 18.1 (*)    HCT 51.7 (*)    All other components within normal limits  PROTIME-INR - Abnormal; Notable for the following:    Prothrombin Time 40.1 (*)    All other components within normal limits  I-STAT TROPOININ, ED - Abnormal; Notable for the following:    Troponin i, poc 0.10 (*)    All other components within normal limits  BRAIN NATRIURETIC PEPTIDE  URINALYSIS, ROUTINE W REFLEX MICROSCOPIC  PROTIME-INR  AMMONIA  VITAMIN B12  FOLATE RBC  RPR  HIV ANTIBODY (ROUTINE TESTING)  RAPID HIV SCREEN (HIV 1/2 AB+AG)  BASIC METABOLIC PANEL  CBC  TSH    EKG  EKG Interpretation  Date/Time:  Wednesday July 07 2016 13:11:09 EDT Ventricular Rate:  90 PR Interval:    QRS Duration: 144 QT Interval:  412 QTC Calculation: 504 R Axis:   -44 Text Interpretation:  Atrial fibrillation Left axis deviation Left bundle branch block Abnormal ECG No significant change since last tracing Confirmed by Shann Lewellyn MD, Lysbeth Galas [phone]) on 07/07/2016 3:37:13 PM       Radiology Dg Chest 2 View  Result Date: 07/07/2016 CLINICAL DATA:  Mid chest pain, dizziness, low blood pressure chest x-ray 411 1,018 EXAM: CHEST  2 VIEW COMPARISON:  None. FINDINGS: The lungs remain clear and somewhat hyperaerated. Mediastinal and hilar contours are unremarkable. Mild cardiomegaly is stable. No acute bony abnormality is seen. IMPRESSION: Stable cardiomegaly and hyperaeration.  No active lung disease. Electronically Signed   By: Ivar Drape M.D.   On: 07/07/2016 13:41   Mr Brain Wo Contrast  Result Date: 07/07/2016 CLINICAL DATA:  Encephalopathy. Word-finding difficulties for a few months, more forgetful. History of atrial fibrillation, migraine headache, pulmonary fibrosis. EXAM: MRI HEAD WITHOUT CONTRAST TECHNIQUE: Multiplanar, multiecho pulse sequences of the brain and surrounding structures were obtained without intravenous contrast. GFR 22. COMPARISON:  CT HEAD January 12, 2014 FINDINGS: BRAIN: No reduced  diffusion to suggest acute ischemia. No susceptibility artifact to suggest hemorrhage. 9 mm rounded focus of bright T2 and low T1 signal LEFT cerebellum with intermediate FLAIR signal and, T2 shine through. Patchy supratentorial white matter FLAIR T2 hyperintensities without midline shift or mass effect. Ventricles and sulci are normal for patient's age. No abnormal extra-axial fluid collections. VASCULAR: Normal major intracranial vascular flow voids present at skull base. SKULL AND UPPER CERVICAL SPINE: No abnormal sellar expansion. No suspicious calvarial bone marrow signal. Craniocervical junction maintained. SINUSES/ORBITS: The mastoid air-cells and included paranasal sinuses are well-aerated. The included ocular globes and orbital contents are non-suspicious. Status post bilateral ocular lens implants. OTHER: Patient is edentulous. IMPRESSION: 9 mm LEFT cerebellar focus of apparent demyelination though, atypical for a patient of this age. Findings less likely represent subacute infarct. Given patient's GFR 22, consider consultation with nephrology prior to contrast administration. Recommend follow-up MRI of the brain in 6-8 weeks, with contrast if able. Moderate chronic small vessel ischemic disease. Electronically Signed   By: Elon Alas M.D.   On: 07/07/2016 19:09    Procedures Procedures (including critical care time)  Medications Ordered in ED Medications  pravastatin (PRAVACHOL) tablet 40 mg (not administered)  ipratropium-albuterol (DUONEB) 0.5-2.5 (3) MG/3ML nebulizer solution 3 mL (not administered)  allopurinol (ZYLOPRIM) tablet 150 mg (not administered)  Fluticasone-Umeclidin-Vilant 100-62.5-25 MCG/INH AEPB 1 puff (not administered)  buPROPion (WELLBUTRIN XL) 24 hr tablet 150 mg (not administered)  montelukast (SINGULAIR) tablet 10 mg (not administered)  gabapentin (NEURONTIN) capsule 300 mg (not administered)  levothyroxine (SYNTHROID, LEVOTHROID) tablet 50  mcg (not  administered)  diltiazem (CARDIZEM CD) 24 hr capsule 120 mg (not administered)  hydroxypropyl methylcellulose / hypromellose (ISOPTO TEARS / GONIOVISC) 2.5 % ophthalmic solution 1 drop (not administered)  digoxin (LANOXIN) tablet 125 mcg (not administered)  aspirin EC tablet 81 mg (not administered)  ALPRAZolam (XANAX) tablet 0.25 mg (0.25 mg Oral Given 07/07/16 1737)  polyethylene glycol (MIRALAX / GLYCOLAX) packet 17 g (not administered)  ondansetron (ZOFRAN) tablet 4 mg (not administered)    Or  ondansetron (ZOFRAN) injection 4 mg (not administered)  acetaminophen (TYLENOL) tablet 650 mg (not administered)    Or  acetaminophen (TYLENOL) suppository 650 mg (not administered)  HYDROcodone-acetaminophen (NORCO/VICODIN) 5-325 MG per tablet 1-2 tablet (not administered)  0.9 %  sodium chloride infusion (not administered)  aspirin chewable tablet 324 mg (324 mg Oral Given 07/07/16 1704)  sodium chloride 0.9 % bolus 500 mL (0 mLs Intravenous Stopped 07/07/16 1903)     Initial Impression / Assessment and Plan / ED Course  I have reviewed the triage vital signs and the nursing notes.  Pertinent labs & imaging results that were available during my care of the patient were reviewed by me and considered in my medical decision making (see chart for details).    76 yo F with PMHx as above here with acute delirium, likely multifactorial in setting of polypharmacy (prednisone, benadryl, xanax) as well as possible acute on chronic hyponatremia 2/2 aggressive diuresis and dehydration as an outpatient. Less likely CVA/intracranial abnormality but this is also on DDx - no focal neuro deficits however. Labs show acute on chronic hyponatremia, AKI, and uremia c/w dehydration/diuresis. Trop elevated, likely demand and EKG shows chornic AFib with LBBB, no Sgarbossa. Will admit for further work-up, gentle fluids.  Final Clinical Impressions(s) / ED Diagnoses   Final diagnoses:  Polypharmacy  Delirium  Acute  renal failure superimposed on chronic kidney disease, unspecified CKD stage, unspecified acute renal failure type ([place])  Dehydration  Acute hyponatremia    New Prescriptions New Prescriptions   No medications on file     Duffy Bruce, MD 07/07/16 1930

## 2016-07-07 NOTE — ED Notes (Signed)
Pt given PO fluids and sandwich bag. Tolerating everything well. Showing NAD.

## 2016-07-07 NOTE — ED Notes (Signed)
Had given report on patient to floor RN; when speaking with family regarding patient's bed placement, RN saw patient's bed was to be reassigned. MD placed new order for SDU bed.

## 2016-07-07 NOTE — ED Notes (Signed)
Patient transported to CT 

## 2016-07-07 NOTE — ED Triage Notes (Signed)
Pt had been taking prednisone for 2 days due to Sciatica but was then told to stop that and take Xanax which she started yesterday. She reports feeling chest pressure last night.  Daughter reports she has been having trouble finding her words over the past few months and also reports pt wet herself this morning at 4:30am which is unusual for her. Pt alert to self, place and situation but answered month incorrectly.

## 2016-07-08 ENCOUNTER — Ambulatory Visit: Payer: Self-pay | Admitting: Internal Medicine

## 2016-07-08 ENCOUNTER — Inpatient Hospital Stay (HOSPITAL_COMMUNITY): Payer: Medicare Other

## 2016-07-08 DIAGNOSIS — G9340 Encephalopathy, unspecified: Secondary | ICD-10-CM

## 2016-07-08 LAB — FOLATE RBC
Folate, Hemolysate: 254.9 ng/mL
Folate, RBC: 526 ng/mL (ref >498)
Hematocrit: 48.5 % — ABNORMAL HIGH (ref 34.0–46.6)

## 2016-07-08 LAB — BASIC METABOLIC PANEL
ANION GAP: 12 (ref 5–15)
BUN: 52 mg/dL — AB (ref 6–20)
CALCIUM: 9.2 mg/dL (ref 8.9–10.3)
CO2: 33 mmol/L — ABNORMAL HIGH (ref 22–32)
Chloride: 83 mmol/L — ABNORMAL LOW (ref 101–111)
Creatinine, Ser: 1.98 mg/dL — ABNORMAL HIGH (ref 0.44–1.00)
GFR calc Af Amer: 27 mL/min — ABNORMAL LOW (ref >60)
GFR, EST NON AFRICAN AMERICAN: 24 mL/min — AB (ref >60)
Glucose, Bld: 107 mg/dL — ABNORMAL HIGH (ref 65–99)
POTASSIUM: 2.7 mmol/L — AB (ref 3.5–5.1)
SODIUM: 128 mmol/L — AB (ref 135–145)

## 2016-07-08 LAB — CBC
HEMATOCRIT: 45.1 % (ref 36.0–46.0)
Hemoglobin: 15.3 g/dL — ABNORMAL HIGH (ref 12.0–15.0)
MCH: 30.5 pg (ref 26.0–34.0)
MCHC: 33.9 g/dL (ref 30.0–36.0)
MCV: 89.8 fL (ref 78.0–100.0)
Platelets: 216 10*3/uL (ref 150–400)
RBC: 5.02 MIL/uL (ref 3.87–5.11)
RDW: 15.4 % (ref 11.5–15.5)
WBC: 10.2 10*3/uL (ref 4.0–10.5)

## 2016-07-08 LAB — RPR: RPR Ser Ql: NONREACTIVE

## 2016-07-08 LAB — MRSA PCR SCREENING: MRSA BY PCR: NEGATIVE

## 2016-07-08 LAB — PROTIME-INR
INR: 4.89
Prothrombin Time: 46.3 seconds — ABNORMAL HIGH (ref 11.4–15.2)

## 2016-07-08 LAB — HIV ANTIBODY (ROUTINE TESTING W REFLEX): HIV SCREEN 4TH GENERATION: NONREACTIVE

## 2016-07-08 LAB — TROPONIN I: TROPONIN I: 0.09 ng/mL — AB (ref <0.03)

## 2016-07-08 MED ORDER — IPRATROPIUM-ALBUTEROL 0.5-2.5 (3) MG/3ML IN SOLN
3.0000 mL | Freq: Two times a day (BID) | RESPIRATORY_TRACT | Status: DC
Start: 2016-07-09 — End: 2016-07-10
  Administered 2016-07-09 – 2016-07-10 (×3): 3 mL via RESPIRATORY_TRACT
  Filled 2016-07-08 (×3): qty 3

## 2016-07-08 MED ORDER — POTASSIUM CHLORIDE CRYS ER 20 MEQ PO TBCR
40.0000 meq | EXTENDED_RELEASE_TABLET | Freq: Once | ORAL | Status: AC
  Administered 2016-07-08: 40 meq via ORAL
  Filled 2016-07-08: qty 2

## 2016-07-08 MED ORDER — WARFARIN - PHARMACIST DOSING INPATIENT
Freq: Every day | Status: DC
  Administered 2016-07-10 – 2016-07-11 (×2)

## 2016-07-08 MED ORDER — IPRATROPIUM-ALBUTEROL 0.5-2.5 (3) MG/3ML IN SOLN
3.0000 mL | RESPIRATORY_TRACT | Status: DC | PRN
  Filled 2016-07-08 (×2): qty 3

## 2016-07-08 MED ORDER — HEPARIN SODIUM (PORCINE) 5000 UNIT/ML IJ SOLN: 5000.0000 [IU] | Freq: Three times a day (TID) | INTRAMUSCULAR | Status: DC

## 2016-07-08 MED ORDER — METOPROLOL TARTRATE 12.5 MG HALF TABLET
12.5000 mg | ORAL_TABLET | Freq: Two times a day (BID) | ORAL | Status: DC
  Administered 2016-07-08: 12.5 mg via ORAL
  Filled 2016-07-08: qty 1

## 2016-07-08 NOTE — Care Management Note (Signed)
Case Management Note  Patient Details  Name: Katie [name] MRN: [id number] Date of Birth: [birth year]  Subjective/Objective:  Form home with spouse,  Presents with  chest pain, HA and confusion.               Action/Plan: NCM will follow for dc needs.  Expected Discharge Date:                  Expected Discharge Plan:     In-House Referral:     Discharge planning Services  CM Consult  Post Acute Care Choice:    Choice offered to:     DME Arranged:    DME Agency:     HH Arranged:    HH Agency:     Status of Service:  In process, will continue to follow  If discussed at Long Length of Stay Meetings, dates discussed:    Additional Comments:  Zenon Mayo, RN 07/08/2016, 4:25 PM

## 2016-07-08 NOTE — Consult Note (Signed)
NEURO HOSPITALIST CONSULT NOTE   Requestig physician: Dr. Denton Brick   Reason for Consult: cerebellar lesion   History obtained from:  Patient   Chart    HPI:                                                                                                                                          Katie [name] is an 76 y.o. female with medical history significant of  CAD, diastolic CHF, normal aortic aneurysm, morbid obesity, CK D stage III, pulmonary fibrosis as well as sequela of amiodarone therapy, atrial fibrillation on coumadin, hypothyroidism, COPD, hypertension, GERD who presented to the emergency department with complaints of chest pain, HA  and confusion.  Today she presented to the ED with confusion and her daughter said that over the past few months her mother had difficulties finding words, she became more forgetful and called her neighbor to ask what is her phone number although she had for the last 50 years. Patient admits she has become more forgetful and her gait as been off--drifting to the right. She also states it has been harder for her to get up from a seated position and tending to shuffle her gait. She believes it likely is due to being dehydrated.   Na 128 K 2.7 BUN 52 Cr 1.98 INR 4.8 TSH 6.06   Past Medical History:  Diagnosis Date  . Atrial fibrillation ([place])   . Migraine headache   . Osteopenia     Past Surgical History:  Procedure Laterality Date  . ABDOMINAL AORTIC ENDOVASCULAR STENT GRAFT N/A 04/11/2013   Procedure: ABDOMINAL AORTIC ENDOVASCULAR STENT GRAFT WITH RIGHT FEMORAL PATCH ANGIOPLASTY;  Surgeon: Mal Misty, MD;  Location: [facility];  Service: Vascular;  Laterality: N/A;  . BREAST SURGERY     LEFT BREAST BIOPSY  . broken leg Left 1970s  . CARDIAC CATHETERIZATION    . CHOLECYSTECTOMY  1972  . COLONOSCOPY    . EYE SURGERY Bilateral   . Laser vein procedure    . REFRACTIVE SURGERY    . TONSILLECTOMY      Family  History  Problem Relation Age of Onset  . Cirrhosis Mother   . Cancer Mother 61    PANCREAS  . Heart defect Sister   . Breast cancer Sister     age 98  . Heart disease Sister   . Stroke Sister   . Alcohol abuse Father   . Depression Father   . Hypertension Brother   . Hyperlipidemia Son   . Heart disease Daughter      Social History:  reports that she quit smoking about 13 years ago. Her smoking use included Cigarettes. She has never used smokeless tobacco. She reports that she drinks alcohol. She reports that  she does not use drugs.  Allergies  Allergen Reactions  . Amiodarone Other (See Comments)    PULMONARY TOXICITY  . Diovan [Valsartan] Other (See Comments)    HYPOTENSION  . Doxycycline Diarrhea and Other (See Comments)    VISUAL DISTURBANCE  . Flexeril [Cyclobenzaprine] Other (See Comments)    FATIGUE  . Keflex [Cephalexin] Diarrhea  . Verapamil Other (See Comments)    EDEMA  . Advair Diskus [Fluticasone-Salmeterol] Other (See Comments)    SKIN CHANGES.  Can tolerate low dose of prednisone without complications  . Codeine Other (See Comments)    unknown    MEDICATIONS:                                                                                                                     Prior to Admission:  Prescriptions Prior to Admission  Medication Sig Dispense Refill Last Dose  . acetaminophen (TYLENOL) 500 MG tablet Take 1,000 mg by mouth 2 (two) times daily as needed for moderate pain.   Past Week at Unknown time  . albuterol (PROVENTIL HFA;VENTOLIN HFA) 108 (90 BASE) MCG/ACT inhaler 2 puffs 5 minutes apart every 4 to 6 hours as needed to rescue asthma (Patient taking differently: Inhale 2 puffs into the lungs every 4 (four) hours as needed for shortness of breath. Inhale 2 puffs into the lungs 5 minutes apart every 4 to 6 hours as needed to rescue asthma) 1 Inhaler 11 07/07/2016 at Unknown time  . allopurinol (ZYLOPRIM) 300 MG tablet TAKE 1 TABLET BY MOUTH EVERY  DAY 90 tablet 1 07/07/2016 at Unknown time  . ALPRAZolam (XANAX) 0.5 MG tablet Take 1/2 to 1 tablet tid PRN for anxiety. 30 tablet 0 07/06/2016 at Unknown time  . aspirin 81 MG tablet Take 81 mg by mouth daily.     07/07/2016 at Unknown time  . azelastine (ASTELIN) 0.1 % nasal spray Place 2 sprays into both nostrils 2 (two) times daily. Use in each nostril as directed (Patient taking differently: Place 2 sprays into both nostrils at bedtime as needed for rhinitis or allergies. Use in each nostril as directed) 30 mL 2  at prn  . buPROPion (WELLBUTRIN XL) 150 MG 24 hr tablet Take 1 tablet (150 mg total) by mouth every morning. 30 tablet 2 Past Month at Unknown time  . cetirizine (ZYRTEC) 10 MG tablet TAKE 1 CAPSULE (10 MG TOTAL) BY MOUTH AT BEDTIME. 90 tablet 3 07/06/2016 at Unknown time  . digoxin (LANOXIN) 0.25 MG tablet TAKE 1 TABLET BY MOUTH DAILY (Patient taking differently: TAKE 1/2 TABLET BY MOUTH DAILY) 90 tablet 1 07/07/2016 at Unknown time  . diltiazem (CARDIZEM CD) 120 MG 24 hr capsule Take 1 capsule (120 mg total) by mouth daily. 90 capsule 1 07/07/2016 at Unknown time  . Fluticasone-Umeclidin-Vilant (TRELEGY ELLIPTA) 100-62.5-25 MCG/INH AEPB Inhale 1 puff into the lungs daily. 60 each 3 Past Week at Unknown time  . furosemide (LASIX) 80 MG tablet TAKE 1 TABLET BY MOUTH TWICE A DAY  AS NEEDED FOR FLUID (Patient taking differently: 40mg  twice daily) 180 tablet 1 07/07/2016 at am  . gabapentin (NEURONTIN) 300 MG capsule TAKE ONE CAPSULE BY MOUTH 3 TIMES A DAY 270 capsule 0 07/07/2016 at am  . hydroxypropyl methylcellulose / hypromellose (ISOPTO TEARS / GONIOVISC) 2.5 % ophthalmic solution Place 1 drop into both eyes 4 (four) times daily as needed for dry eyes.    07/07/2016 at Unknown time  . ipratropium-albuterol (DUONEB) 0.5-2.5 (3) MG/3ML SOLN Inhale 3 ml 4 x/ day or every 4 hours for chronic asthma 1080 mL 1 Past Week at Unknown time  . levothyroxine (SYNTHROID, LEVOTHROID) 50 MCG tablet TAKE 1 TABLET  EVERY DAY (Patient taking differently: TAKE 56mcg TABLET EVERY DAY) 90 tablet 2 07/06/2016 at Unknown time  . meclizine (ANTIVERT) 25 MG tablet Take 1 tablet (25 mg total) by mouth 3 (three) times daily as needed for dizziness. 60 tablet 1 Past Week at Unknown time  . metolazone (ZAROXOLYN) 2.5 MG tablet TAKE 1 TABLET (2.5 MG TOTAL) BY MOUTH DAILY. 30 tablet 1 Past Month at Unknown time  . montelukast (SINGULAIR) 10 MG tablet TAKE 1 TABLET EVERY DAY FOR ALLERGIES 90 tablet 1 07/06/2016 at Unknown time  . potassium chloride SA (KLOR-CON M20) 20 MEQ tablet Take 2 tablets (40 mEq total) by mouth daily. 180 tablet 1 07/06/2016 at Unknown time  . pravastatin (PRAVACHOL) 40 MG tablet TAKE 1 TABLET BY MOUTH AT BEDTIME FOR CHOLESTEROL 90 tablet 1 07/07/2016 at Unknown time  . prednisoLONE acetate (PRED FORTE) 1 % ophthalmic suspension INSTILL 1 DROP INTO RIGHT EYE once daily  1 07/06/2016 at Unknown time  . prochlorperazine (COMPAZINE) 5 MG tablet TAKE 1 TABLET BY MOUTH 3 TIMES A DAY FOR VERTIGO OR NAUSEA 90 tablet 1  at prn  . warfarin (COUMADIN) 2 MG tablet TAKE 1 TO 2 TABLETS BY MOUTH DAILY OR AS DIRECTED (Patient taking differently: 3mg  daily) 180 tablet 0 07/07/2016 at 0900  . predniSONE (DELTASONE) 20 MG tablet 1 tab 3 x day for 3 days, then 1 tab 2 x day for 3 days, then 1 tab 1 x day for 5 days 20 tablet 0 07/04/2016   Scheduled: . allopurinol  150 mg Oral Daily  . aspirin EC  81 mg Oral Daily  . budesonide (PULMICORT) nebulizer solution  0.25 mg Nebulization BID  . buPROPion  150 mg Oral [id number]  . digoxin  125 mcg Oral Daily  . diltiazem  120 mg Oral Daily  . gabapentin  300 mg Oral TID  . ipratropium-albuterol  3 mL Nebulization Q6H  . levothyroxine  50 mcg Oral QAC breakfast  . montelukast  10 mg Oral QHS  . potassium chloride  40 mEq Oral Once  . pravastatin  40 mg Oral q1800  . umeclidinium-vilanterol  1 puff Inhalation Daily  . Warfarin - Pharmacist Dosing Inpatient   Does not apply q1800  .  zolpidem  5 mg Oral Once     ROS:  History obtained from the patient  General ROS: negative for - chills, fatigue, fever, night sweats, weight gain or weight loss Psychological ROS: negative for - behavioral disorder, hallucinations, memory difficulties, mood swings or suicidal ideation Ophthalmic ROS: negative for - blurry vision, double vision, eye pain or loss of vision ENT ROS: negative for - epistaxis, nasal discharge, oral lesions, sore throat, tinnitus or vertigo Allergy and Immunology ROS: negative for - hives or itchy/watery eyes Hematological and Lymphatic ROS: negative for - bleeding problems, bruising or swollen lymph nodes Endocrine ROS: negative for - galactorrhea, hair pattern changes, polydipsia/polyuria or temperature intolerance Respiratory ROS: negative for - cough, hemoptysis, shortness of breath or wheezing Cardiovascular ROS: negative for - chest pain, dyspnea on exertion, edema or irregular heartbeat Gastrointestinal ROS: negative for - abdominal pain, diarrhea, hematemesis, nausea/vomiting or stool incontinence Genito-Urinary ROS: negative for - dysuria, hematuria, incontinence or urinary frequency/urgency Musculoskeletal ROS: negative for - joint swelling or muscular weakness Neurological ROS: as noted in HPI Dermatological ROS: negative for rash and skin lesion changes   Blood pressure 114/70, pulse 82, temperature 97.3 F (36.3 C), temperature source Oral, resp. rate 15, height 5\' 3"  (1.6 m), weight 87.5 kg (193 lb), SpO2 98 %.   Neurologic Examination:                                                                                                      HEENT-  Normocephalic, no lesions, without obvious abnormality.  Normal external eye and conjunctiva.  Normal TM's bilaterally.  Normal auditory canals and external ears. Normal  external nose, mucus membranes and septum.  Normal pharynx. Cardiovascular- irregularly irregular rhythm, pulses palpable throughout   Lungs- chest clear, no wheezing, rales, normal symmetric air entry Abdomen- normal findings: bowel sounds normal Extremities- no edema Lymph-no adenopathy palpable Musculoskeletal-no joint tenderness, deformity or swelling Skin-warm and dry, no hyperpigmentation, vitiligo, or suspicious lesions  Neurological Examination Mental Status: Alert, oriented,to hospital and year.  Speech fluent without evidence of aphasia.  Able to follow 3 step commands without difficulty. Cranial Nerves: II: Visual fields grossly normal, pupils equal, round, reactive to light and accommodation III,IV, VI: ptosis not present, extra-ocular motions intact bilaterally V,VII: smile symmetric, facial light touch sensation normal bilaterally VIII: hearing normal bilaterally IX,X: uvula rises symmetrically XI: bilateral shoulder shrug XII: midline tongue extension Motor: Right : Upper extremity   5/5    Left:     Upper extremity   5/5  Lower extremity   4/5     Lower extremity   5/5 Tone and bulk:normal tone throughout; no atrophy noted Sensory: Pinprick and light touch intact throughout, bilaterally Deep Tendon Reflexes: 2+ and symmetric throughout left side and right side but right side is more brisk Plantars: Right: downgoing   Left: downgoing Cerebellar: normal finger-to-nose,  and normal heel-to-shin test Gait: drifts to the right when walking      Lab Results: Basic Metabolic Panel:  Recent Labs Lab 07/07/16 1512 07/08/16 0559  NA 129* 128*  K 3.5 2.7*  CL 79* 83*  CO2 34* 33*  GLUCOSE  100* 107*  BUN 56* 52*  CREATININE 2.11* 1.98*  CALCIUM 10.6* 9.2    Liver Function Tests: No results for input(s): AST, ALT, ALKPHOS, BILITOT, PROT, ALBUMIN in the last 168 hours. No results for input(s): LIPASE, AMYLASE in the last 168 hours.  Recent Labs Lab  07/07/16 1941  AMMONIA 26    CBC:  Recent Labs Lab 07/07/16 1512 07/08/16 0559  WBC 10.9* 10.2  HGB 18.1* 15.3*  HCT 51.7* 45.1  MCV 89.8 89.8  PLT 210 216    Cardiac Enzymes:  Recent Labs Lab 07/07/16 2249 07/08/16 0559  TROPONINI 0.08* 0.09*    Lipid Panel: No results for input(s): CHOL, TRIG, HDL, CHOLHDL, VLDL, LDLCALC in the last 168 hours.  CBG: No results for input(s): GLUCAP in the last 168 hours.  Microbiology: Results for orders placed or performed during the hospital encounter of 07/07/16  MRSA PCR Screening     Status: None   Collection Time: 07/07/16 10:23 PM  Result Value Ref Range Status   MRSA by PCR NEGATIVE NEGATIVE Final    Comment:        The GeneXpert MRSA Assay (FDA approved for NASAL specimens only), is one component of a comprehensive MRSA colonization surveillance program. It is not intended to diagnose MRSA infection nor to guide or monitor treatment for MRSA infections.     Coagulation Studies:  Recent Labs  07/07/16 1512 07/08/16 0559  LABPROT 40.1* 46.3*  INR 3.91 4.89*    Imaging: Dg Chest 2 View  Result Date: 07/07/2016 CLINICAL DATA:  Mid chest pain, dizziness, low blood pressure chest x-ray 411 1,018 EXAM: CHEST  2 VIEW COMPARISON:  None. FINDINGS: The lungs remain clear and somewhat hyperaerated. Mediastinal and hilar contours are unremarkable. Mild cardiomegaly is stable. No acute bony abnormality is seen. IMPRESSION: Stable cardiomegaly and hyperaeration.  No active lung disease. Electronically Signed   By: Ivar Drape M.D.   On: 07/07/2016 13:41   Mr Brain Wo Contrast  Result Date: 07/07/2016 CLINICAL DATA:  Encephalopathy. Word-finding difficulties for a few months, more forgetful. History of atrial fibrillation, migraine headache, pulmonary fibrosis. EXAM: MRI HEAD WITHOUT CONTRAST TECHNIQUE: Multiplanar, multiecho pulse sequences of the brain and surrounding structures were obtained without intravenous  contrast. GFR 22. COMPARISON:  CT HEAD January 12, 2014 FINDINGS: BRAIN: No reduced diffusion to suggest acute ischemia. No susceptibility artifact to suggest hemorrhage. 9 mm rounded focus of bright T2 and low T1 signal LEFT cerebellum with intermediate FLAIR signal and, T2 shine through. Patchy supratentorial white matter FLAIR T2 hyperintensities without midline shift or mass effect. Ventricles and sulci are normal for patient's age. No abnormal extra-axial fluid collections. VASCULAR: Normal major intracranial vascular flow voids present at skull base. SKULL AND UPPER CERVICAL SPINE: No abnormal sellar expansion. No suspicious calvarial bone marrow signal. Craniocervical junction maintained. SINUSES/ORBITS: The mastoid air-cells and included paranasal sinuses are well-aerated. The included ocular globes and orbital contents are non-suspicious. Status post bilateral ocular lens implants. OTHER: Patient is edentulous. IMPRESSION: 9 mm LEFT cerebellar focus of apparent demyelination though, atypical for a patient of this age. Findings less likely represent subacute infarct. Given patient's GFR 22, consider consultation with nephrology prior to contrast administration. Recommend follow-up MRI of the brain in 6-8 weeks, with contrast if able. Moderate chronic small vessel ischemic disease. Electronically Signed   By: Elon Alas M.D.   On: 07/07/2016 19:09       Assessment and plan per attending neurologist  Etta Quill PA-C [facility] [phone]  07/08/2016, 1:37 PM  On my exam, she is awake and alert. She is oriented to person, place, month, year. She is able to spell world backwards. She is able to give me the number of quarters in $2.75.  Assessment/Plan: 76 YO female with right cerebellar lesion of unclear etiology. Possibilities include demyelinating lesion versus CVA. I think either way, it is likely old. She has no clear previous clinical episodes suggestive of MS.  Her  memory complaints are difficult to evaluate in the setting of hypothyroidism, and I would favor getting this treated prior to making any other formal diagnosis.  For her unsteadiness, I would think that physical therapy would be an appropriate evaluation.  She is already on platelet therapy.   One thing that could be helpful would be MRI of the cervical and thoracic spine to assess for lesions, and these are present that a mixed demyelinating disease much more likely.  1) MRI cervical and thoracic spine. 2) correction of metabolic abnormalities. 3) treatment of hypothyroidism. 4) if her creatinine improves to the point that a contrasted scan is possible this would be reasonable. 5) lipid panel. 6) continue ASA 7) physical therapy   Roland Rack, MD [facility] [phone]  If 7pm- 7am, please page neurology on call as listed in [place].

## 2016-07-08 NOTE — Progress Notes (Signed)
CRITICAL VALUE ALERT  Critical value received:  Potassium 2.7  Date of notification:  07/08/16   Time of notification:  5093  Critical value read back:Yes.    Nurse who received alert:  Levonne Hubert   MD notified (1st page):  Emokpae  Time of first page:  [phone]  MD notified (2nd page):   Time of second page:  Responding MD:    Time MD responded:

## 2016-07-08 NOTE — Progress Notes (Signed)
Patient calling out for sleep medication, already sleeping upon assessment. Refused ambien ordered at bedtime and denies pain at this time. Will continue to monitor.

## 2016-07-08 NOTE — Progress Notes (Addendum)
Patient Demographics:    Katie [name], is a 76 y.o. [name], DOB - 01-Aug-1940, UMP:[id number]  Admit date - 07/07/2016   Admitting Physician Waldemar Dickens, MD  Outpatient Primary MD for the patient is [name], MD  LOS - 1   No chief complaint on file.       Subjective:    Katie [name] today has no fevers, no emesis,  No chest pain,  Talking more, patient is more coherent   Assessment  & Plan :    Principal Problem:   Acute encephalopathy Active Problems:   Essential hypertension   Atrial fibrillation (HCC)   Congestive heart failure (HCC)   COPD (chronic obstructive pulmonary disease) with chronic bronchitis (HCC)   Hyperlipidemia   Chest pain   Hyponatremia   Acute kidney injury superimposed on chronic kidney disease (HCC)   Acute respiratory failure with hypoxia ([place])  MRI Brain from 07/07/16:- 9 mm LEFT cerebellar focus of apparent demyelination though, atypical for a patient of this age. Findings less likely represent subacute infarct. Given patient's GFR 22, consider consultation with nephrology prior to contrast administration. Recommend follow-up MRI of the brain in 6-8 weeks, with contrast if able.  Brief summary:- Katie [name] is an 76 y.o. [name] with medical history significant of CAD, diastolic CHF, normal aortic aneurysm, morbid obesity, CK D stage III, pulmonary fibrosis as well as sequela of amiodarone therapy, atrial fibrillation on coumadin, hypothyroidism, COPD, hypertension, GERD who presented to the emergency department with complaints of chest pain, HA and confusion.  1)Acute Encephalopathy/Gait Concerns - with episodes of confusion, unsteady gait, difficulty finding words, MRI brain findings as above, discussed with Dr. Leonel Ramsay the neurologist advised MRI of the C-spine and thoracic spine as well as contrast MRI of the brain down the road when renal  function allows. Physical therapy evaluation pending.  ammonia level is within normal limits, TSH is elevated at 6.0,, B12 and folate level are not low, RPR and HIV and not reactive. She has normal UA and chest Xray did not suggest any infection. Suspect patient has underlying cognitive deficits (dementia type) with superimposed delirium most likely due to hyponatremia and acute kidney injury (admission sodium was 129 and admission creatinine was 2.1  2)Chest pain in patient with known CAD- patient is  chest pain-free, creatinine peaked at 0.10, get echocardiogram to rule out wall motion modalities, consider cardiology consult if regional abnormalities on echo. Give aspirin, pravastatin and metoprolol, EF from last echocardiogram dated 12/24/2015 with 60-65%.   3)Hyponatremia/Hypokalemia - sodium was 129 admission, monitor serial BMPs to avoid overcorrection, diuretics on hold, replace Kcl  4)Atrial fibrillation - rate controlled, continue digoxin and Cardizem and metoprolol for rate control, Coumadin on hold as INR is over 4  5)AKI superimposed on CKD stage III -  admission creatinine was 2.1, patient's baseline is around 1.3,, continue to hydrate, avoid nephrotoxic agents, Adjust meds to renal - Zyloprim - from 300 mg to 150 mg  6)COPD - continue hope nebs and Singular, appears compensated at this time  7)Hypothyroidism - continue Synthroid    DVT prophylaxis: Coumadin on hold due to supratherapeutic INR  Code Status : Full  Disposition Plan  : to be determine (PT to evaluate patient may need  skilled nursing facility for rehabilitation)  Consults  :  Neurologic is Dr. Leonel Ramsay   DVT Prophylaxis  :  Coumadin   Lab Results  Component Value Date   PLT 216 07/08/2016    Inpatient Medications  Scheduled Meds: . allopurinol  150 mg Oral Daily  . aspirin EC  81 mg Oral Daily  . budesonide (PULMICORT) nebulizer solution  0.25 mg Nebulization BID  . buPROPion  150 mg Oral  BH-q7a  . digoxin  125 mcg Oral Daily  . diltiazem  120 mg Oral Daily  . gabapentin  300 mg Oral TID  . ipratropium-albuterol  3 mL Nebulization Q6H  . levothyroxine  50 mcg Oral QAC breakfast  . montelukast  10 mg Oral QHS  . potassium chloride  40 mEq Oral Once  . potassium chloride  40 mEq Oral Once  . potassium chloride  40 mEq Oral Once  . pravastatin  40 mg Oral q1800  . umeclidinium-vilanterol  1 puff Inhalation Daily  . zolpidem  5 mg Oral Once   Continuous Infusions: . sodium chloride 75 mL/hr at 07/07/16 2354   PRN Meds:.acetaminophen **OR** acetaminophen, ALPRAZolam, HYDROcodone-acetaminophen, hydroxypropyl methylcellulose / hypromellose, ondansetron **OR** ondansetron (ZOFRAN) IV, polyethylene glycol    Anti-infectives    None        Objective:   Vitals:   07/07/16 2300 07/08/16 0000 07/08/16 0223 07/08/16 0400  BP: 109/79 (!) 119/46  (!) 113/42  Pulse: 82 71  67  Resp: 17 (!) 22  16  Temp:  97.5 F (36.4 C)  97.5 F (36.4 C)  TempSrc:  Oral  Oral  SpO2:  100% 96% 92%  Weight:      Height:        Wt Readings from Last 3 Encounters:  07/07/16 87.5 kg (193 lb)  06/29/16 91.2 kg (201 lb)  06/23/16 92.1 kg (203 lb)     Intake/Output Summary (Last 24 hours) at 07/08/16 0758 Last data filed at 07/08/16 0700  Gross per 24 hour  Intake           1032.5 ml  Output              651 ml  Net            381.5 ml     Physical Exam  Gen:- Awake Alert,  In no apparent distress , more coherent, HEENT:- [place].AT, No sclera icterus Neck-Supple Neck,No JVD,.  Lungs-  CTAB  CV- S1, S2 normal, irregularly irregular Abd-  +ve B.Sounds, Abd Soft, No tenderness,    Extremity/Skin:- No  edema,    NeuroPsych- unsteady gait, episodes of confusion and disorientation from time to time, some forgetfulness and cognitive concerns    Data Review:   Micro Results Recent Results (from the past 240 hour(s))  MRSA PCR Screening     Status: None   Collection Time:  07/07/16 10:23 PM  Result Value Ref Range Status   MRSA by PCR NEGATIVE NEGATIVE Final    Comment:        The GeneXpert MRSA Assay (FDA approved for NASAL specimens only), is one component of a comprehensive MRSA colonization surveillance program. It is not intended to diagnose MRSA infection nor to guide or monitor treatment for MRSA infections.     Radiology Reports Dg Chest 2 View  Result Date: 07/07/2016 CLINICAL DATA:  Mid chest pain, dizziness, low blood pressure chest x-ray 411 1,018 EXAM: CHEST  2 VIEW COMPARISON:  None. FINDINGS: The lungs remain  clear and somewhat hyperaerated. Mediastinal and hilar contours are unremarkable. Mild cardiomegaly is stable. No acute bony abnormality is seen. IMPRESSION: Stable cardiomegaly and hyperaeration.  No active lung disease. Electronically Signed   By: Ivar Drape M.D.   On: 07/07/2016 13:41   Dg Chest 2 View  Result Date: 06/23/2016 CLINICAL DATA:  Dyspnea EXAM: CHEST  2 VIEW COMPARISON:  06/01/2016 chest radiograph. FINDINGS: Partially visualized abdominal aortic stent graft in the upper abdominal aorta. Stable cardiomediastinal silhouette with mild cardiomegaly and aortic atherosclerosis. No pneumothorax. No pleural effusion. Emphysema. Borderline lung hyperinflation. No pulmonary edema. No acute consolidative airspace disease. IMPRESSION: 1. Emphysema and borderline lung hyperinflation, which may indicate COPD. No acute pulmonary disease. 2. Stable cardiomegaly without pulmonary edema. 3. Aortic atherosclerosis. Electronically Signed   By: Ilona Sorrel M.D.   On: 06/23/2016 10:31   Mr Brain Wo Contrast  Result Date: 07/07/2016 CLINICAL DATA:  Encephalopathy. Word-finding difficulties for a few months, more forgetful. History of atrial fibrillation, migraine headache, pulmonary fibrosis. EXAM: MRI HEAD WITHOUT CONTRAST TECHNIQUE: Multiplanar, multiecho pulse sequences of the brain and surrounding structures were obtained without  intravenous contrast. GFR 22. COMPARISON:  CT HEAD January 12, 2014 FINDINGS: BRAIN: No reduced diffusion to suggest acute ischemia. No susceptibility artifact to suggest hemorrhage. 9 mm rounded focus of bright T2 and low T1 signal LEFT cerebellum with intermediate FLAIR signal and, T2 shine through. Patchy supratentorial white matter FLAIR T2 hyperintensities without midline shift or mass effect. Ventricles and sulci are normal for patient's age. No abnormal extra-axial fluid collections. VASCULAR: Normal major intracranial vascular flow voids present at skull base. SKULL AND UPPER CERVICAL SPINE: No abnormal sellar expansion. No suspicious calvarial bone marrow signal. Craniocervical junction maintained. SINUSES/ORBITS: The mastoid air-cells and included paranasal sinuses are well-aerated. The included ocular globes and orbital contents are non-suspicious. Status post bilateral ocular lens implants. OTHER: Patient is edentulous. IMPRESSION: 9 mm LEFT cerebellar focus of apparent demyelination though, atypical for a patient of this age. Findings less likely represent subacute infarct. Given patient's GFR 22, consider consultation with nephrology prior to contrast administration. Recommend follow-up MRI of the brain in 6-8 weeks, with contrast if able. Moderate chronic small vessel ischemic disease. Electronically Signed   By: Elon Alas M.D.   On: 07/07/2016 19:09     CBC  Recent Labs Lab 07/07/16 1512 07/08/16 0559  WBC 10.9* 10.2  HGB 18.1* 15.3*  HCT 51.7* 45.1  PLT 210 216  MCV 89.8 89.8  MCH 31.4 30.5  MCHC 35.0 33.9  RDW 15.4 15.4    Chemistries   Recent Labs Lab 07/07/16 1512 07/08/16 0559  NA 129* 128*  K 3.5 2.7*  CL 79* 83*  CO2 34* 33*  GLUCOSE 100* 107*  BUN 56* 52*  CREATININE 2.11* 1.98*  CALCIUM 10.6* 9.2   ------------------------------------------------------------------------------------------------------------------ No results for input(s): CHOL, HDL,  LDLCALC, TRIG, CHOLHDL, LDLDIRECT in the last 72 hours.  Lab Results  Component Value Date   HGBA1C 5.7 (H) 06/16/2016   ------------------------------------------------------------------------------------------------------------------  Recent Labs  07/07/16 1941  TSH 6.060*   ------------------------------------------------------------------------------------------------------------------  Recent Labs  07/07/16 1941  VITAMINB12 529    Coagulation profile  Recent Labs Lab 07/07/16 1512 07/08/16 0559  INR 3.91 4.89*    No results for input(s): DDIMER in the last 72 hours.  Cardiac Enzymes  Recent Labs Lab 07/07/16 2249 07/08/16 0559  TROPONINI 0.08* 0.09*   ------------------------------------------------------------------------------------------------------------------    Component Value Date/Time   BNP 60.2 07/07/2016 1512  BNP 57.8 06/23/2016 1144     Masson Nalepa M.D on 07/08/2016 at 7:58 AM  Between 7am to 7pm - Pager - [phone]  After 7pm go to [email or web address] - password [email or web address]  [facility] -  Office  [phone]  Dragon dictation system was used to create this note, attempts have been made to correct errors, however presence of uncorrected errors is not a reflection quality of care provided

## 2016-07-08 NOTE — Progress Notes (Signed)
Text page to Dr Denton Brick with critical labs - all electrolytes are out of normal ranges with potassium being 2.7 Pt can take PO meds and orders received also notified of INR of 4.89

## 2016-07-08 NOTE — Progress Notes (Signed)
I prayed for patient but was unable to visit due to nurse caring for patient at the time. Nurse suggested maybe a visit another time due to family dynamics on visitation. Not really clear. Chaplain available as needed.

## 2016-07-08 NOTE — Progress Notes (Signed)
CRITICAL VALUE ALERT  Critical value received:  Troponin 0.08  Date of notification:  07/07/16  Time of notification:  2358  Critical value read back:Yes.    Nurse who received alert:  Levonne Hubert   MD notified (1st page):  Schorr, NP  Time of first page:  0000  MD notified (2nd page):  Time of second page:  Responding MD:    Time MD responded:

## 2016-07-08 NOTE — Progress Notes (Signed)
Text page to DR Denton Brick - made aware of Hr that had dropped to at fib in the high 30's briefly Pt was asymptomatic Held am dose of Digoxin. No Dig level was done on admission

## 2016-07-08 NOTE — Progress Notes (Signed)
ANTICOAGULATION CONSULT NOTE - Follow-Up  Pharmacy Consult for warfarin Indication: atrial fibrillation  Patient Measurements: Height: 5\' 3"  (160 cm) Weight: 193 lb (87.5 kg) IBW/kg (Calculated) : 52.4  Vital Signs: Temp: 97.4 F (36.3 C) (04/26 0813) Temp Source: Oral (04/26 0813) BP: 100/56 (04/26 0813) Pulse Rate: 65 (04/26 0813)  Labs:  Recent Labs  07/07/16 1512 07/07/16 2249 07/08/16 0559  HGB 18.1*  --  15.3*  HCT 51.7*  --  45.1  PLT 210  --  216  LABPROT 40.1*  --  46.3*  INR 3.91  --  4.89*  CREATININE 2.11*  --  1.98*  TROPONINI  --  0.08* 0.09*    Estimated Creatinine Clearance: 26.1 mL/min (A) (by C-G formula based on SCr of 1.98 mg/dL (H)).   Assessment: 76 yo female on warfarin prior to arrival for atrial fibrillation admitted 4/25 with CP and confusion. Admit INR 3.91 with last dose of warfarin taken 4/25.  INR today is SUPRAtherapeutic (INR 4.89 << 3.91, goal of 2-3). Hgb/Hct drop but likely dilutional since the patient was dehydrated on admission and has been receiving IVF, plts wnl. No overt s/sx of bleeding noted.   Goal of Therapy:  INR 2-3 Monitor platelets by anticoagulation protocol: Yes   Plan:  1. Hold warfarin dose today 2. Will continue to monitor for any signs/symptoms of bleeding and will follow up with PT/INR in the a.m.   Thank you for allowing pharmacy to be a part of this patient's care.  Alycia Rossetti, PharmD, BCPS Clinical Pharmacist Pager: [phone] Clinical phone for 07/08/2016 from 7a-3:30p: [phone] If after 3:30p, please call main pharmacy at: [phone] 07/08/2016 8:50 AM

## 2016-07-09 ENCOUNTER — Inpatient Hospital Stay (HOSPITAL_COMMUNITY): Payer: Medicare Other

## 2016-07-09 LAB — BASIC METABOLIC PANEL
Anion gap: 8 (ref 5–15)
BUN: 36 mg/dL — AB (ref 6–20)
CHLORIDE: 91 mmol/L — AB (ref 101–111)
CO2: 34 mmol/L — ABNORMAL HIGH (ref 22–32)
CREATININE: 1.59 mg/dL — AB (ref 0.44–1.00)
Calcium: 8.9 mg/dL (ref 8.9–10.3)
GFR calc Af Amer: 36 mL/min — ABNORMAL LOW (ref >60)
GFR calc non Af Amer: 31 mL/min — ABNORMAL LOW (ref >60)
GLUCOSE: 104 mg/dL — AB (ref 65–99)
Potassium: 3.8 mmol/L (ref 3.5–5.1)
SODIUM: 133 mmol/L — AB (ref 135–145)

## 2016-07-09 LAB — PROTIME-INR
INR: 5.45
Prothrombin Time: 51.3 seconds — ABNORMAL HIGH (ref 11.4–15.2)

## 2016-07-09 LAB — LIPID PANEL
CHOL/HDL RATIO: 4.3 ratio
Cholesterol: 121 mg/dL (ref 0–200)
HDL: 28 mg/dL — AB (ref >40)
LDL CALC: 44 mg/dL (ref 0–99)
Triglycerides: 246 mg/dL — ABNORMAL HIGH (ref <150)
VLDL: 49 mg/dL — AB (ref 0–40)

## 2016-07-09 LAB — MAGNESIUM: MAGNESIUM: 2.1 mg/dL (ref 1.7–2.4)

## 2016-07-09 MED ORDER — ALPRAZOLAM 0.5 MG PO TABS
0.5000 mg | ORAL_TABLET | Freq: Three times a day (TID) | ORAL | Status: DC | PRN
  Administered 2016-07-10 – 2016-07-13 (×9): 0.5 mg via ORAL
  Filled 2016-07-09 (×9): qty 1

## 2016-07-09 MED ORDER — VITAMIN K1 10 MG/ML IJ SOLN
1.0000 mg | Freq: Once | INTRAMUSCULAR | Status: AC
  Administered 2016-07-09: 1 mg via INTRAVENOUS
  Filled 2016-07-09: qty 0.1

## 2016-07-09 MED ORDER — DILTIAZEM HCL ER COATED BEADS 120 MG PO CP24
120.0000 mg | ORAL_CAPSULE | Freq: Every day | ORAL | Status: DC
  Administered 2016-07-10 – 2016-07-12 (×3): 120 mg via ORAL
  Filled 2016-07-09 (×3): qty 1

## 2016-07-09 MED ORDER — SODIUM CHLORIDE 0.9 % IV BOLUS (SEPSIS)
1000.0000 mL | Freq: Once | INTRAVENOUS | Status: AC
  Administered 2016-07-09: 1000 mL via INTRAVENOUS

## 2016-07-09 NOTE — Progress Notes (Signed)
McClung MD notified that patients SBP range 85-107 this morning with HR range 49-60. Patient has scheduled cardizem, metoprolol, and digoxin that has not been given yet this morning. New orders received. Metoprolol & cardizem discontinued and new parameters added for digoxin administration. Will continue to monitor closely.

## 2016-07-09 NOTE — Progress Notes (Signed)
Patient feels stable.   She is awake, alert, pleasant and cooperative.   76 YO female with right cerebellar lesion of unclear etiology. Possibilities include demyelinating lesion versus CVA. I think either way, it is likely old. She has no clear previous clinical episodes suggestive of MS.  Her memory complaints are difficult to evaluate in the setting of hypothyroidism, and I would favor getting this treated prior to making any other formal diagnosis.  For her unsteadiness, I would think that physical therapy would be an appropriate intervention.   She is already on platelet and anticoagulant. therapy. LDL < 70,   My suspicion is that this is an old stroke, though repeat imaging in 3 months could be prudent. She will need outpatient follow up. Neurology will sign off.  Roland Rack, MD [facility] [phone]  If 7pm- 7am, please page neurology on call as listed in [place].

## 2016-07-09 NOTE — Progress Notes (Signed)
ANTICOAGULATION CONSULT NOTE - Follow-Up  Pharmacy Consult for warfarin Indication: atrial fibrillation  Patient Measurements: Height: 5\' 3"  (160 cm) Weight: 193 lb (87.5 kg) IBW/kg (Calculated) : 52.4  Vital Signs: Temp: 97.6 F (36.4 C) (04/27 0723) Temp Source: Oral (04/27 0723) BP: 100/53 (04/27 0723) Pulse Rate: 82 (04/27 0723)  Labs:  Recent Labs  07/07/16 1512 07/07/16 1941 07/07/16 2249 07/08/16 0559 07/09/16 0239  HGB 18.1*  --   --  15.3*  --   HCT 51.7* 48.5*  --  45.1  --   PLT 210  --   --  216  --   LABPROT 40.1*  --   --  46.3* 51.3*  INR 3.91  --   --  4.89* 5.45*  CREATININE 2.11*  --   --  1.98* 1.59*  TROPONINI  --   --  0.08* 0.09*  --     Estimated Creatinine Clearance: 32.5 mL/min (A) (by C-G formula based on SCr of 1.59 mg/dL (H)).   Assessment: 76 yo female on warfarin prior to arrival for atrial fibrillation admitted 4/25 with CP and confusion. Admit INR 3.91 with last dose of warfarin taken 4/25.  INR today remains SUPRAtherapeutic despite holding warfarin doses (INR 5.45 << 4.89, goal of 2-3). Non CBC this AM however a Hgb/Hct drop was noted 4/26 but this was thought to be likely dilutional since the patient was dehydrated on admission and has been receiving IVF, plts wnl. No overt s/sx of bleeding noted. The MD has ordered Vit K 1 mg IV x 1 this AM.   Goal of Therapy:  INR 2-3 Monitor platelets by anticoagulation protocol: Yes   Plan:  1. Hold warfarin dose today 2. Will continue to monitor for any signs/symptoms of bleeding and will follow up with PT/INR in the a.m.   Thank you for allowing pharmacy to be a part of this patient's care.  Alycia Rossetti, PharmD, BCPS Clinical Pharmacist Pager: [phone] Clinical phone for 07/09/2016 from 7a-3:30p: [phone] If after 3:30p, please call main pharmacy at: [phone] 07/09/2016 9:16 AM

## 2016-07-09 NOTE — Evaluation (Signed)
Physical Therapy Evaluation Patient Details Name: Katie [name] MRN: [id number] DOB: [birth year] Today's Date: 07/09/2016   History of Present Illness  Patient is a 76 yo female admitted 07/07/16 with chest pain, headache, confusion.  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      PMH:  CAD, CHF, aortic aneurysm, obesity, CKD, pulmonary fibrosis, Afib, COPD, HTN  Clinical Impression  Patient presents with problems listed below.  Will benefit from acute PT to maximize functional independence prior to d/c home with family.  Patient with decreased strength, balance, impacting mobility/gait.  Recommend f/u HHPT at d/c.    Follow Up Recommendations Home health PT;Supervision for mobility/OOB    Equipment Recommendations  None recommended by PT    Recommendations for Other Services       Precautions / Restrictions Precautions Precautions: Fall Restrictions Weight Bearing Restrictions: No      Mobility  Bed Mobility Overal bed mobility: Needs Assistance Bed Mobility: Supine to Sit;Sit to Supine     Supine to sit: Min guard Sit to supine: Min assist   General bed mobility comments: Assist to bring LE's onto bed.    Transfers Overall transfer level: Needs assistance Equipment used: Rolling walker (2 wheeled) Transfers: Sit to/from Stand Sit to Stand: Min guard         General transfer comment: Assist for safety.  Patient using correct hand placement.  Ambulation/Gait Ambulation/Gait assistance: Min guard Ambulation Distance (Feet): 24 Feet Assistive device: Rolling walker (2 wheeled) Gait Pattern/deviations: Step-through pattern;Decreased stride length;Shuffle Gait velocity: decreased Gait velocity interpretation: Below normal speed for age/gender General Gait Details: Patient with slow, shuffling gait - partly due to lines/tubes.  Patient reports "I usually go faster".   Did not note patient  drifting to right during short distance.  Stairs            Wheelchair Mobility    Modified Rankin (Stroke Patients Only) Modified Rankin (Stroke Patients Only) Pre-Morbid Rankin Score: No symptoms Modified Rankin: Moderately severe disability     Balance Overall balance assessment: Needs assistance Sitting-balance support: No upper extremity supported;Feet supported Sitting balance-Leahy Scale: Good     Standing balance support: Bilateral upper extremity supported Standing balance-Leahy Scale: Poor                               Pertinent Vitals/Pain Pain Assessment: No/denies pain    Home Living Family/patient expects to be discharged to:: Private residence Living Arrangements: Spouse/significant other Available Help at Discharge: Family;Available PRN/intermittently Type of Home: House Home Access: Stairs to enter Entrance Stairs-Rails: Psychiatric nurse of Steps: 5 Home Layout: One level Home Equipment: Walker - 2 wheels;Walker - 4 wheels;Cane - single point;Bedside commode      Prior Function Level of Independence: Independent         Comments: At baseline patient active.  Recently had been using RW due to gait disturbance.     Hand Dominance   Dominant Hand: Right    Extremity/Trunk Assessment   Upper Extremity Assessment Upper Extremity Assessment: Generalized weakness    Lower Extremity Assessment Lower Extremity Assessment: Generalized weakness       Communication   Communication: No difficulties  Cognition Arousal/Alertness: Awake/alert Behavior During Therapy: WFL for tasks assessed/performed Overall Cognitive Status: Within Functional Limits for tasks assessed  General Comments      Exercises     Assessment/Plan    PT Assessment Patient needs continued PT services  PT Problem List Decreased strength;Decreased balance;Decreased  mobility;Cardiopulmonary status limiting activity       PT Treatment Interventions DME instruction;Gait training;Stair training;Functional mobility training;Therapeutic activities;Therapeutic exercise;Balance training;Patient/family education    PT Goals (Current goals can be found in the Care Plan section)  Acute Rehab PT Goals Patient Stated Goal: To return home PT Goal Formulation: With patient Time For Goal Achievement: 07/16/16 Potential to Achieve Goals: Good    Frequency Min 3X/week   Barriers to discharge        Co-evaluation               End of Session Equipment Utilized During Treatment: Gait belt;Oxygen Activity Tolerance: Patient tolerated treatment well Patient left: in bed;with call bell/phone within reach;with bed alarm set Nurse Communication: Mobility status PT Visit Diagnosis: Unsteadiness on feet (R26.81);Other abnormalities of gait and mobility (R26.89);Muscle weakness (generalized) (M62.81)    Time: 5974-7185 PT Time Calculation (min) (ACUTE ONLY): 21 min   Charges:   PT Evaluation $PT Eval Moderate Complexity: 1 Procedure     PT G Codes:        Carita Pian. Sanjuana Kava, [facility] Acute Rehab Services Pager [place] 07/09/2016, 7:34 PM

## 2016-07-09 NOTE — Progress Notes (Signed)
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  Subjective: The patient is sitting on the side of bed preparing to work with therapy.  She denies chest pain fevers chills nausea vomiting or abdominal pain.  Assessment & Plan:  Acute Encephalopathy / Gait Concerns Family reported intermittent episodes of confusion, unsteady gait, difficulty finding words - MRI brain w/o acute findings but w/ ?of cerebellar lesion (not likely related to AMS) - ammonia, B12, and folate levels normal - RPR and HIV negative - normal UA - suspect underlying dementia with superimposed delirium most likely due to hyponatremia and acute kidney injury/azotemia - begin PT/OT - follow mental status w/ tx of hypothyroidism, normalization of electrolytes, and volume resusciation  Chest pain in patient with known CAD echocardiogram to rule out wall motion abnormalities is pending - sx have resolved at this time   Recent Labs Lab 07/07/16 2249 07/08/16 0559  TROPONINI 0.08* 0.09*    R cerebellar lesion of unclear etiology  Neurology consulted and feels "possibilities include demyelinating lesion versus CVA" - is felt to be old - suggests outpt Neuro f/o - has now signed off - consider f/u MRI as outpt in 6-8 weeks   Hyponatremia Appears to be hypovolemic in nature - continue to gently hydrate - slowly improving  Recent Labs Lab 07/07/16 1512 07/08/16 0559 07/09/16 0239  NA 129* 128* 133*    Hypokalemia Likely due to diuretic therapy - supplemented to normal range  Chronic Atrial  fibrillation Rate controlled - continue usual medical therapy as blood pressure will allow - on Coumadin at home with INR currently supratherapeutic  Chronic diastolic CHF EF 67-12% via TTE 12/24/2015 without specific qualification of diastolic function - f/u TTE pending at present   AKI superimposed on CKD stage III baseline creatinine approximately 1.3 - continue to hydrate and avoid nephrotoxic agents  Recent Labs Lab 07/07/16 1512 07/08/16 0559 07/09/16 0239  CREATININE 2.11* 1.98* 1.59*    COPD continue hope nebs and Singular, appears compensated at this time  Hypothyroidism TSH 6 - reports being on same dose of synthroid "for years" - may simply be altered by acute illness - increase dose for now    DVT prophylaxis: warfarin Code Status: FULL CODE Family Communication: no family present at time of exam  Disposition Plan: PT/OT - follow lytes - f/u TTE  Consultants:  Neurology   Procedures: none  Antimicrobials:  none   Objective: Blood pressure (!) 114/45, pulse 60, temperature 97.4 F (36.3 C), temperature source Oral, resp. rate 18, height 5\' 3"  (1.6 m), weight 87.5 kg (193 lb), SpO2 98 %.  Intake/Output Summary (Last 24 hours) at 07/09/16 1542 Last data filed at 07/09/16 1418  Gross per 24 hour  Intake             4005 ml  Output              600 ml  Net             3405 ml   Filed Weights   07/07/16 1309  Weight: 87.5 kg (193 lb)    Examination: General: No acute respiratory  distress Lungs: Clear to auscultation bilaterally without wheezes or crackles Cardiovascular: Regular rate without murmur gallop or rub normal S1 and S2 Abdomen: Nontender, nondistended, soft, bowel sounds positive, no rebound, no ascites, no appreciable mass Extremities: No significant cyanosis, clubbing, or edema bilateral lower extremities  CBC:  Recent Labs Lab 07/07/16 1512 07/07/16 1941 07/08/16 0559  WBC 10.9*  --  10.2  HGB 18.1*  --  15.3*  HCT 51.7* 48.5*  45.1  MCV 89.8  --  89.8  PLT 210  --  532   Basic Metabolic Panel:  Recent Labs Lab 07/07/16 1512 07/08/16 0559 07/09/16 0239  NA 129* 128* 133*  K 3.5 2.7* 3.8  CL 79* 83* 91*  CO2 34* 33* 34*  GLUCOSE 100* 107* 104*  BUN 56* 52* 36*  CREATININE 2.11* 1.98* 1.59*  CALCIUM 10.6* 9.2 8.9  MG  --   --  2.1   GFR: Estimated Creatinine Clearance: 32.5 mL/min (A) (by C-G formula based on SCr of 1.59 mg/dL (H)).  Liver Function Tests: No results for input(s): AST, ALT, ALKPHOS, BILITOT, PROT, ALBUMIN in the last 168 hours. No results for input(s): LIPASE, AMYLASE in the last 168 hours.  Recent Labs Lab 07/07/16 1941  AMMONIA 26    Coagulation Profile:  Recent Labs Lab 07/07/16 1512 07/08/16 0559 07/09/16 0239  INR 3.91 4.89* 5.45*    Cardiac Enzymes:  Recent Labs Lab 07/07/16 2249 07/08/16 0559  TROPONINI 0.08* 0.09*    HbA1C: Hgb A1c MFr Bld  Date/Time Value Ref Range Status  06/16/2016 01:54 PM 5.7 (H) <5.7 % Final    Comment:      For someone without known diabetes, a hemoglobin A1c value between 5.7% and 6.4% is consistent with prediabetes and should be confirmed with a follow-up test.   For someone with known diabetes, a value <7% indicates that their diabetes is well controlled. A1c targets should be individualized based on duration of diabetes, age, co-morbid conditions and other considerations.   This assay result is consistent with an increased risk of diabetes.   Currently, no consensus exists regarding use of hemoglobin A1c for diagnosis of diabetes in children.     03/01/2016 11:28 AM 5.2 <5.7 % Final    Comment:      For the purpose of screening for the presence of diabetes:   <5.7%       Consistent with the absence of diabetes 5.7-6.4 %   Consistent with increased risk for diabetes (prediabetes) >=6.5 %     Consistent with diabetes   This assay result is consistent with a decreased risk of diabetes.   Currently, no  consensus exists regarding use of hemoglobin A1c for diagnosis of diabetes in children.   According to American Diabetes Association (ADA) guidelines, hemoglobin A1c <7.0% represents optimal control in non-pregnant diabetic patients. Different metrics may apply to specific patient populations. Standards of Medical Care in Diabetes (ADA).        Recent Results (from the past 240 hour(s))  MRSA PCR Screening     Status: None   Collection Time: 07/07/16 10:23 PM  Result Value Ref Range Status   MRSA by PCR NEGATIVE NEGATIVE Final    Comment:        The GeneXpert MRSA Assay (FDA approved for NASAL specimens only), is one component of a comprehensive MRSA colonization surveillance program. It is not intended to diagnose MRSA infection nor to guide or monitor treatment for MRSA infections.      Scheduled  Meds: . allopurinol  150 mg Oral Daily  . aspirin EC  81 mg Oral Daily  . budesonide (PULMICORT) nebulizer solution  0.25 mg Nebulization BID  . buPROPion  150 mg Oral BH-q7a  . digoxin  125 mcg Oral Daily  . [START ON 07/10/2016] diltiazem  120 mg Oral Daily  . gabapentin  300 mg Oral TID  . ipratropium-albuterol  3 mL Nebulization BID  . levothyroxine  50 mcg Oral QAC breakfast  . montelukast  10 mg Oral QHS  . pravastatin  40 mg Oral q1800  . umeclidinium-vilanterol  1 puff Inhalation Daily  . Warfarin - Pharmacist Dosing Inpatient   Does not apply q1800  . zolpidem  5 mg Oral Once   Continuous Infusions: . sodium chloride 75 mL/hr at 07/09/16 0517     LOS: 2 days   Cherene Altes, MD [facility] Office  [phone] Pager - Text Page per Shea Evans as per below:  On-Call/Text Page:      [email or web address]      password [email or web address]  If 7PM-7AM, please contact night-coverage [email or web address] Password [facility] 07/09/2016, 3:42 PM

## 2016-07-10 ENCOUNTER — Inpatient Hospital Stay (HOSPITAL_COMMUNITY): Payer: Medicare Other

## 2016-07-10 DIAGNOSIS — R079 Chest pain, unspecified: Secondary | ICD-10-CM

## 2016-07-10 LAB — ECHOCARDIOGRAM COMPLETE
AO mean calculated velocity dopler: 143 cm/s
AOPV: 0.5 m/s
AV Mean grad: 10 mmHg
AV area mean vel ind: 0.78 cm2/m2
AV vel: 1.53
AVA: 1.53 cm2
AVAREAMEANV: 1.57 cm2
AVAREAVTI: 1.58 cm2
AVAREAVTIIND: 0.76 cm2/m2
AVCELMEANRAT: 0.5
AVPG: 19 mmHg
AVPKVEL: 220 cm/s
CHL CUP AV PEAK INDEX: 0.79
CHL CUP AV VALUE AREA INDEX: 0.76
CHL CUP DOP CALC LVOT VTI: 20.4 cm
FS: 17 % — AB (ref 28–44)
Height: 63 in
IVS/LV PW RATIO, ED: 1.19
LA diam end sys: 42 mm
LA diam index: 2.09 cm/m2
LA vol A4C: 78.1 ml
LA vol: 84.6 mL
LASIZE: 42 mm
LAVOLIN: 42 mL/m2
LDCA: 3.14 cm2
LVOT SV: 64 mL
LVOTD: 20 mm
LVOTPV: 111 cm/s
LVOTVTI: 0.49 cm
PW: 9.57 mm — AB (ref 0.6–1.1)
VTI: 41.8 cm
Weight: 3088 oz

## 2016-07-10 LAB — COMPREHENSIVE METABOLIC PANEL
ALBUMIN: 2.9 g/dL — AB (ref 3.5–5.0)
ALK PHOS: 31 U/L — AB (ref 38–126)
ALT: 19 U/L (ref 14–54)
ANION GAP: 6 (ref 5–15)
AST: 22 U/L (ref 15–41)
BUN: 21 mg/dL — ABNORMAL HIGH (ref 6–20)
CHLORIDE: 99 mmol/L — AB (ref 101–111)
CO2: 30 mmol/L (ref 22–32)
Calcium: 8.6 mg/dL — ABNORMAL LOW (ref 8.9–10.3)
Creatinine, Ser: 1.31 mg/dL — ABNORMAL HIGH (ref 0.44–1.00)
GFR calc non Af Amer: 39 mL/min — ABNORMAL LOW (ref >60)
GFR, EST AFRICAN AMERICAN: 45 mL/min — AB (ref >60)
GLUCOSE: 108 mg/dL — AB (ref 65–99)
Potassium: 3.7 mmol/L (ref 3.5–5.1)
SODIUM: 135 mmol/L (ref 135–145)
Total Bilirubin: 0.7 mg/dL (ref 0.3–1.2)
Total Protein: 5.1 g/dL — ABNORMAL LOW (ref 6.5–8.1)

## 2016-07-10 LAB — CBC
HCT: 39.3 % (ref 36.0–46.0)
HEMOGLOBIN: 12.8 g/dL (ref 12.0–15.0)
MCH: 30 pg (ref 26.0–34.0)
MCHC: 32.6 g/dL (ref 30.0–36.0)
MCV: 92.3 fL (ref 78.0–100.0)
Platelets: 176 10*3/uL (ref 150–400)
RBC: 4.26 MIL/uL (ref 3.87–5.11)
RDW: 15.7 % — ABNORMAL HIGH (ref 11.5–15.5)
WBC: 8.7 10*3/uL (ref 4.0–10.5)

## 2016-07-10 LAB — PROTIME-INR
INR: 1.91
Prothrombin Time: 22.1 seconds — ABNORMAL HIGH (ref 11.4–15.2)

## 2016-07-10 LAB — DIGOXIN LEVEL: Digoxin Level: 1.4 ng/mL (ref 0.8–2.0)

## 2016-07-10 LAB — CORTISOL: Cortisol, Plasma: 3 ug/dL

## 2016-07-10 MED ORDER — DIGOXIN 125 MCG PO TABS
0.0625 mg | ORAL_TABLET | Freq: Every day | ORAL | Status: DC
Start: 2016-07-11 — End: 2016-07-16
  Administered 2016-07-11 – 2016-07-16 (×6): 0.0625 mg via ORAL
  Filled 2016-07-10 (×7): qty 1

## 2016-07-10 MED ORDER — WARFARIN SODIUM 5 MG PO TABS
2.5000 mg | ORAL_TABLET | Freq: Once | ORAL | Status: AC
  Administered 2016-07-10: 2.5 mg via ORAL
  Filled 2016-07-10: qty 1

## 2016-07-10 MED ORDER — ALUM & MAG HYDROXIDE-SIMETH 200-200-20 MG/5ML PO SUSP
15.0000 mL | ORAL | Status: DC | PRN
  Administered 2016-07-10 – 2016-07-12 (×3): 15 mL via ORAL
  Filled 2016-07-10 (×3): qty 30

## 2016-07-10 MED ORDER — BIOTENE DRY MOUTH MT LIQD: 15.0000 mL | OROMUCOSAL | Status: DC | PRN

## 2016-07-10 NOTE — Progress Notes (Signed)
  Echocardiogram 2D Echocardiogram has been performed.  Johny Chess 07/10/2016, 10:52 AM

## 2016-07-10 NOTE — Progress Notes (Signed)
Received patient from [facility].  Patient c/o gas, Mylanta given as per standing orders.  Family members in room.

## 2016-07-10 NOTE — Progress Notes (Addendum)
ANTICOAGULATION CONSULT NOTE - Follow-Up  Pharmacy Consult for warfarin Indication: atrial fibrillation  Patient Measurements: Height: 5\' 3"  (160 cm) Weight: 193 lb (87.5 kg) IBW/kg (Calculated) : 52.4  Vital Signs: Temp: 97.8 F (36.6 C) (04/28 1141) Temp Source: Oral (04/28 1141) BP: 101/56 (04/28 1141) Pulse Rate: 77 (04/28 1141)  Labs:  Recent Labs  07/07/16 1512 07/07/16 1941 07/07/16 2249 07/08/16 0559 07/09/16 0239 07/10/16 0228  HGB 18.1*  --   --  15.3*  --  12.8  HCT 51.7* 48.5*  --  45.1  --  39.3  PLT 210  --   --  216  --  176  LABPROT 40.1*  --   --  46.3* 51.3* 22.1*  INR 3.91  --   --  4.89* 5.45* 1.91  CREATININE 2.11*  --   --  1.98* 1.59* 1.31*  TROPONINI  --   --  0.08* 0.09*  --   --     Estimated Creatinine Clearance: 39.5 mL/min (A) (by C-G formula based on SCr of 1.31 mg/dL (H)).   Assessment: 76 yo female on warfarin prior to arrival for atrial fibrillation admitted 4/25 with CP and confusion. Admit INR 3.91 with last dose of warfarin taken 4/25.  INR decreased from 5.45 to 1.91 this morning following 1 mg vitamin K yesterday. Expect INR to decrease further tomorrow. Hgb/Hct drop was noted but this is thought to be dilutional since the patient was dehydrated on admission and has been receiving IVF, plts wnl. No overt s/sx of bleeding noted. Levothyroxine dose was increased this admit which may increase the INR - recommend close monitoring.  Goal of Therapy:  INR 2-3 Monitor platelets by anticoagulation protocol: Yes   Plan:  1. Warfarin 2.5 mg PO tonight 2. Will continue to monitor for any signs/symptoms of bleeding and will follow up with PT/INR in the a.m.   Thank you for allowing pharmacy to be a part of this patient's care.  Renold Genta, PharmD, BCPS Clinical Pharmacist Phone for today - [place] - [phone] 07/10/2016 2:41 PM

## 2016-07-10 NOTE — Progress Notes (Signed)
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  Subjective: The pt is feeling a little better today.  She tells me for months now she has felt very lethargic, and would fall asleep in the middle of a conversation w/ her husband.  She denies cp, sob, n/v, or abdom pain. She feels that her legs are beginning to swell a bit like they do at home.    Assessment & Plan:  Acute Encephalopathy / Gait Concerns Family reported intermittent episodes of confusion, unsteady gait, difficulty finding words - MRI brain w/o acute findings but w/ ?of cerebellar lesion (not likely related to AMS) - ammonia, B12, and folate levels normal - RPR and HIV negative - normal UA - suspect underlying dementia with superimposed delirium most likely due to hyponatremia and acute kidney injury/azotemia - begin PT/OT - follow mental status w/ tx of hypothyroidism, normalization of electrolytes, and volume resusciation - appears to be much improved in this regard  Chest pain in patient with known CAD TTE reveals inferior wall hypokinesis and systolic CHF - last TTE Oct 2017 noted preserved EF, but TTE Sept 2014 noted EF 50% - I can find no prior TTE noting inferior wall hypokinesis - I see no cath report on file - I will ask her Cardiologist to see her in consultation as she may be due a cardiac cath to evaluate this apparently new WMA  Recent Labs Lab 07/07/16 2249 07/08/16 0559  TROPONINI 0.08* 0.09*    Chronic diastolic CHF and systolic CHF of unclear chronicity  EF 60-65% via TTE 12/24/2015  without specific qualification of diastolic function - TTE this admit w/ EF 45-50% w/ inferior wall hypokinesis - check daily weights - pt has rapidly transitioned from signif DH to now very mild volume overload - NSL IV today - no ACEi/ARB for now due to recent acute kidney injury   Filed Weights   07/07/16 1309  Weight: 87.5 kg (193 lb)    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 not significantly changed from Oct 2017  R cerebellar lesion of unclear etiology  Neurology consulted and feels "possibilities include demyelinating lesion versus CVA" - is felt to be old - suggests outpt Neuro f/o - has now signed off - consider f/u MRI as outpt in 6-8 weeks   Hyponatremia Appeared to be hypovolemic in nature - Sodium has normalized with gentle volume resuscitation - watch w/ NSL   Recent Labs Lab 07/07/16 1512 07/08/16 0559 07/09/16 0239 07/10/16 0228  NA 129* 128* 133* 135    Hypokalemia Likely due to diuretic therapy - supplemented to normal range  Chronic Atrial fibrillation Rate controlled - continue usual medical therapy as blood pressure will allow - on Coumadin at home with INR currently supratherapeutic  AKI superimposed on CKD stage III baseline creatinine approximately 1.3 - creatinine has returned to her baseline with volume resuscitation  Recent Labs Lab 07/07/16 1512 07/08/16 0559 07/09/16 0239 07/10/16 0228  CREATININE 2.11* 1.98* 1.59* 1.31*    COPD continue home meds - appears compensated at this time  Hypothyroidism TSH 6 - reports being on same dose of  synthroid "for years" - may simply be altered by acute illness - increased dose as her ROS suggests sx of hypothyroidism    DVT prophylaxis: warfarin Code Status: FULL CODE Family Communication: no family present at time of exam  Disposition Plan: PT/OT - follow volume - transfer to CHF floor - Cards consult in AM  Consultants:  Neurology   Procedures: TTE  Antimicrobials:  none    Objective: Blood pressure (!) 101/56, pulse 77, temperature 97.8 F (36.6 C), temperature source Oral, resp. rate 12, height 5\' 3"  (1.6 m), weight 87.5 kg (193 lb), SpO2 97 %.  Intake/Output Summary (Last 24 hours) at 07/10/16 1502 Last data filed at 07/10/16 0910  Gross per 24 hour  Intake           1568.5 ml  Output             1450 ml  Net            118.5 ml   Filed Weights   07/07/16 1309  Weight: 87.5 kg (193 lb)    Examination: General: No acute respiratory distress - alert and oriented  Lungs: Clear to auscultation bilaterally  Cardiovascular: Regular rate without murmur  Abdomen: Nontender, nondistended, soft, bowel sounds positive, no rebound, no ascites, no appreciable mass Extremities: 1+ B LE edema  CBC:  Recent Labs Lab 07/07/16 1512 07/07/16 1941 07/08/16 0559 07/10/16 0228  WBC 10.9*  --  10.2 8.7  HGB 18.1*  --  15.3* 12.8  HCT 51.7* 48.5* 45.1 39.3  MCV 89.8  --  89.8 92.3  PLT 210  --  216 329   Basic Metabolic Panel:  Recent Labs Lab 07/07/16 1512 07/08/16 0559 07/09/16 0239 07/10/16 0228  NA 129* 128* 133* 135  K 3.5 2.7* 3.8 3.7  CL 79* 83* 91* 99*  CO2 34* 33* 34* 30  GLUCOSE 100* 107* 104* 108*  BUN 56* 52* 36* 21*  CREATININE 2.11* 1.98* 1.59* 1.31*  CALCIUM 10.6* 9.2 8.9 8.6*  MG  --   --  2.1  --    GFR: Estimated Creatinine Clearance: 39.5 mL/min (A) (by C-G formula based on SCr of 1.31 mg/dL (H)).  Liver Function Tests:  Recent Labs Lab 07/10/16 0228  AST 22  ALT 19  ALKPHOS 31*  BILITOT 0.7  PROT 5.1*  ALBUMIN 2.9*    Recent Labs Lab 07/07/16 1941  AMMONIA 26    Coagulation Profile:  Recent Labs Lab 07/07/16 1512 07/08/16 0559 07/09/16 0239 07/10/16 0228  INR 3.91 4.89* 5.45* 1.91    Cardiac Enzymes:  Recent Labs Lab 07/07/16 2249 07/08/16 0559  TROPONINI 0.08* 0.09*    HbA1C: Hgb A1c MFr Bld  Date/Time Value Ref Range Status  06/16/2016 01:54 PM 5.7 (H) <5.7 % Final    Comment:       For someone without known diabetes, a hemoglobin A1c value between 5.7% and 6.4% is consistent with prediabetes and should be confirmed with a follow-up test.   For someone with known diabetes, a value <7% indicates that their diabetes is well controlled. A1c targets should be individualized based on duration of diabetes, age, co-morbid conditions and other considerations.   This assay result is consistent with an increased risk of diabetes.   Currently, no consensus exists regarding use of hemoglobin A1c for diagnosis of diabetes in children.     03/01/2016 11:28 AM 5.2 <5.7 % Final    Comment:      For the purpose of screening  for the presence of diabetes:   <5.7%       Consistent with the absence of diabetes 5.7-6.4 %   Consistent with increased risk for diabetes (prediabetes) >=6.5 %     Consistent with diabetes   This assay result is consistent with a decreased risk of diabetes.   Currently, no consensus exists regarding use of hemoglobin A1c for diagnosis of diabetes in children.   According to American Diabetes Association (ADA) guidelines, hemoglobin A1c <7.0% represents optimal control in non-pregnant diabetic patients. Different metrics may apply to specific patient populations. Standards of Medical Care in Diabetes (ADA).        Recent Results (from the past 240 hour(s))  MRSA PCR Screening     Status: None   Collection Time: 07/07/16 10:23 PM  Result Value Ref Range Status   MRSA by PCR NEGATIVE NEGATIVE Final    Comment:        The GeneXpert MRSA Assay (FDA approved for NASAL specimens only), is one component of a comprehensive MRSA colonization surveillance program. It is not intended to diagnose MRSA infection nor to guide or monitor treatment for MRSA infections.      Scheduled Meds: . allopurinol  150 mg Oral Daily  . aspirin EC  81 mg Oral Daily  . budesonide (PULMICORT) nebulizer solution  0.25 mg Nebulization BID  . buPROPion  150 mg  Oral BH-q7a  . digoxin  125 mcg Oral Daily  . diltiazem  120 mg Oral Daily  . gabapentin  300 mg Oral TID  . ipratropium-albuterol  3 mL Nebulization BID  . levothyroxine  50 mcg Oral QAC breakfast  . montelukast  10 mg Oral QHS  . pravastatin  40 mg Oral q1800  . umeclidinium-vilanterol  1 puff Inhalation Daily  . warfarin  2.5 mg Oral ONCE-1800  . Warfarin - Pharmacist Dosing Inpatient   Does not apply q1800     LOS: 3 days   Cherene Altes, MD [facility] Office  [phone] Pager - Text Page per Amion as per below:  On-Call/Text Page:      [email or web address]      password [email or web address]  If 7PM-7AM, please contact night-coverage [email or web address] Password [email or web address] 07/10/2016, 3:02 PM

## 2016-07-10 NOTE — Progress Notes (Signed)
Patient transferring to [facility]. Report called to receiving nurse. All questions answered. Patient's family at bedside and notified of transfer.

## 2016-07-11 LAB — COMPREHENSIVE METABOLIC PANEL
ALK PHOS: 33 U/L — AB (ref 38–126)
ALT: 22 U/L (ref 14–54)
AST: 25 U/L (ref 15–41)
Albumin: 3.2 g/dL — ABNORMAL LOW (ref 3.5–5.0)
Anion gap: 9 (ref 5–15)
BILIRUBIN TOTAL: 1.1 mg/dL (ref 0.3–1.2)
BUN: 11 mg/dL (ref 6–20)
CALCIUM: 8.8 mg/dL — AB (ref 8.9–10.3)
CO2: 27 mmol/L (ref 22–32)
CREATININE: 1.18 mg/dL — AB (ref 0.44–1.00)
Chloride: 99 mmol/L — ABNORMAL LOW (ref 101–111)
GFR calc Af Amer: 51 mL/min — ABNORMAL LOW (ref >60)
GFR, EST NON AFRICAN AMERICAN: 44 mL/min — AB (ref >60)
Glucose, Bld: 92 mg/dL (ref 65–99)
Potassium: 4.5 mmol/L (ref 3.5–5.1)
Sodium: 135 mmol/L (ref 135–145)
TOTAL PROTEIN: 5.7 g/dL — AB (ref 6.5–8.1)

## 2016-07-11 LAB — CBC
HCT: 37.8 % (ref 36.0–46.0)
Hemoglobin: 12.5 g/dL (ref 12.0–15.0)
MCH: 30.3 pg (ref 26.0–34.0)
MCHC: 33.1 g/dL (ref 30.0–36.0)
MCV: 91.7 fL (ref 78.0–100.0)
PLATELETS: 173 10*3/uL (ref 150–400)
RBC: 4.12 MIL/uL (ref 3.87–5.11)
RDW: 15.9 % — AB (ref 11.5–15.5)
WBC: 9.7 10*3/uL (ref 4.0–10.5)

## 2016-07-11 LAB — TROPONIN I: Troponin I: 0.07 ng/mL (ref <0.03)

## 2016-07-11 LAB — PROTIME-INR
INR: 1.5
PROTHROMBIN TIME: 18.3 s — AB (ref 11.4–15.2)

## 2016-07-11 MED ORDER — GABAPENTIN 100 MG PO CAPS
200.0000 mg | ORAL_CAPSULE | Freq: Three times a day (TID) | ORAL | Status: DC
  Administered 2016-07-11 – 2016-07-12 (×5): 200 mg via ORAL
  Filled 2016-07-11 (×6): qty 2

## 2016-07-11 MED ORDER — HYDROCODONE-ACETAMINOPHEN 5-325 MG PO TABS
1.0000 | ORAL_TABLET | Freq: Four times a day (QID) | ORAL | Status: DC | PRN
  Administered 2016-07-12 – 2016-07-15 (×6): 1 via ORAL
  Filled 2016-07-11 (×3): qty 1
  Filled 2016-07-11: qty 2
  Filled 2016-07-11 (×2): qty 1

## 2016-07-11 MED ORDER — FUROSEMIDE 20 MG PO TABS
20.0000 mg | ORAL_TABLET | Freq: Two times a day (BID) | ORAL | Status: DC
  Administered 2016-07-11 – 2016-07-12 (×2): 20 mg via ORAL
  Filled 2016-07-11 (×2): qty 1

## 2016-07-11 MED ORDER — COSYNTROPIN 0.25 MG IJ SOLR
0.2500 mg | Freq: Once | INTRAMUSCULAR | Status: AC
  Administered 2016-07-12: 0.25 mg via INTRAVENOUS
  Filled 2016-07-11: qty 0.25

## 2016-07-11 MED ORDER — ENOXAPARIN SODIUM 100 MG/ML ~~LOC~~ SOLN
100.0000 mg | Freq: Two times a day (BID) | SUBCUTANEOUS | Status: DC
  Administered 2016-07-11 (×2): 100 mg via SUBCUTANEOUS
  Filled 2016-07-11 (×2): qty 1

## 2016-07-11 MED ORDER — WARFARIN SODIUM 2.5 MG PO TABS
2.5000 mg | ORAL_TABLET | Freq: Once | ORAL | Status: AC
  Administered 2016-07-11: 2.5 mg via ORAL
  Filled 2016-07-11: qty 1

## 2016-07-11 NOTE — Progress Notes (Signed)
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  Subjective: The patient reports that she did not sleep well at all last night and admits that she was quite anxious.  The patient's family reports that she was somewhat confused during the night.  She presently denies fevers chills nausea vomiting or abdominal pain.  She does report that she developed some substernal chest pressure last night which was self-limited but was described as a pressure type sensation which radiated under her left breast.  She denies current chest pain.  Assessment & Plan:  Acute Encephalopathy / Gait Concerns Family reported intermittent episodes of confusion, unsteady gait, difficulty finding words - MRI brain w/o acute findings but w/ ?of cerebellar lesion (not likely related to AMS) - ammonia, B12, and folate levels normal - RPR and HIV negative - normal UA - suspect underlying dementia with superimposed delirium most likely due to hyponatremia and acute kidney injury/azotemia - cont PT/OT - follow mental status w/ tx of hypothyroidism, normalization of electrolytes, and volume resusciation - appears to be much improved in this regard today - encouraged family to have pt seen by a Geriatric MD as an outpt once she has stabilized for a cognitive evaluation   Chest pain in patient with known CAD TTE reveals inferior wall hypokinesis and systolic CHF - last TTE Oct 2017 noted preserved EF, but TTE Sept 2014 noted EF 50% - I can find no prior TTE noting inferior wall hypokinesis - I see no cath report on  file - I will ask her Cardiologist to see her in consultation as she may be due a cardiac cath to evaluate this apparently new WMA, especially in light of substernal chest pressure she reported last night   Recent Labs Lab 07/07/16 2249 07/08/16 0559 07/11/16 0402  TROPONINI 0.08* 0.09* 0.07*    Chronic diastolic CHF and systolic CHF of unclear chronicity  EF 60-65% via TTE 12/24/2015 without specific qualification of diastolic function - TTE this admit w/ EF 45-50% w/ inferior wall hypokinesis - following daily weights - pt has rapidly transitioned from signif DH to mild volume overload - no ACEi/ARB for now due to recent acute kidney injury - resume low dose diuretic today   Filed Weights   07/07/16 1309 07/10/16 1900 07/11/16 0400  Weight: 87.5 kg (193 lb) 96.2 kg (212 lb) 95.1 kg (209 lb 9.6 oz)    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 not significantly changed from Oct 2017  R cerebellar lesion of unclear etiology  Neurology consulted and feels "possibilities include demyelinating lesion versus CVA" - is felt to be old - suggests outpt Neuro f/o - has now signed off - consider f/u MRI as outpt in 6-8 weeks   Hyponatremia Appeared to be hypovolemic in nature - Sodium has normalized with gentle volume resuscitation - watch w/ NSL and resumption of diuresis today   Recent Labs Lab 07/07/16 1512 07/08/16 0559 07/09/16 0239 07/10/16 0228 07/11/16 0402  NA 129* 128* 133* 135 135    Hypokalemia Likely due to diuretic therapy - supplemented to normal range  Chronic Atrial fibrillation Rate controlled -  continue usual medical therapy - begin lovenox until warfarin back to therapeutic range   AKI superimposed on CKD stage III baseline creatinine approximately 1.3 - creatinine has returned to her baseline with volume resuscitation  Recent Labs Lab 07/07/16 1512 07/08/16 0559 07/09/16 0239 07/10/16 0228 07/11/16 0402  CREATININE 2.11* 1.98* 1.59* 1.31* 1.18*     COPD continue home meds - compensated at this time  Hypothyroidism TSH 6 - reports being on same dose of synthroid "for years" - may simply be altered by acute illness - increased dose as her ROS suggests sx of hypothyroidism - discussed need to keep TSH at ~1.0     DVT prophylaxis: warfarin Code Status: FULL CODE Family Communication: Spoke with the patient's daughter and son at bedside at length Disposition Plan: PT/OT - follow volume - Cards consult in AM  Consultants:  Neurology  [facility]  Procedures: TTE - noted above   Antimicrobials:  none   Objective: Blood pressure 123/85, pulse 86, temperature 98.6 F (37 C), temperature source Oral, resp. rate 20, height 5\' 3"  (1.6 m), weight 95.1 kg (209 lb 9.6 oz), SpO2 94 %.  Intake/Output Summary (Last 24 hours) at 07/11/16 1041 Last data filed at 07/11/16 0600  Gross per 24 hour  Intake             1327 ml  Output              600 ml  Net              727 ml   Filed Weights   07/07/16 1309 07/10/16 1900 07/11/16 0400  Weight: 87.5 kg (193 lb) 96.2 kg (212 lb) 95.1 kg (209 lb 9.6 oz)    Examination: General: No acute respiratory distress - alert and oriented but does not seem to remember our discussion yesterday Lungs: Clear to auscultation bilaterally - no wheezing  Cardiovascular: Regular rate without murmur or gallup  Abdomen: Nontender, nondistended, soft, bowel sounds positive, no rebound, no ascites, no appreciable mass Extremities: 1+ B LE edema w/o change   CBC:  Recent Labs Lab 07/07/16 1512 07/07/16 1941 07/08/16 0559 07/10/16 0228 07/11/16 0703  WBC 10.9*  --  10.2 8.7 9.7  HGB 18.1*  --  15.3* 12.8 12.5  HCT 51.7* 48.5* 45.1 39.3 37.8  MCV 89.8  --  89.8 92.3 91.7  PLT 210  --  216 176 244   Basic Metabolic Panel:  Recent Labs Lab 07/07/16 1512 07/08/16 0559 07/09/16 0239 07/10/16 0228 07/11/16 0402  NA 129* 128* 133* 135 135  K 3.5 2.7* 3.8 3.7 4.5  CL 79* 83* 91* 99*  99*  CO2 34* 33* 34* 30 27  GLUCOSE 100* 107* 104* 108* 92  BUN 56* 52* 36* 21* 11  CREATININE 2.11* 1.98* 1.59* 1.31* 1.18*  CALCIUM 10.6* 9.2 8.9 8.6* 8.8*  MG  --   --  2.1  --   --    GFR: Estimated Creatinine Clearance: 45.9 mL/min (A) (by C-G formula based on SCr of 1.18 mg/dL (H)).  Liver Function Tests:  Recent Labs Lab 07/10/16 0228 07/11/16 0402  AST 22 25  ALT 19 22  ALKPHOS 31* 33*  BILITOT 0.7 1.1  PROT 5.1* 5.7*  ALBUMIN 2.9* 3.2*    Recent Labs Lab 07/07/16 1941  AMMONIA 26    Coagulation Profile:  Recent Labs Lab 07/07/16 1512 07/08/16 0559 07/09/16 0239 07/10/16 0228 07/11/16 0402  INR 3.91 4.89* 5.45* 1.91 1.50  Cardiac Enzymes:  Recent Labs Lab 07/07/16 2249 07/08/16 0559 07/11/16 0402  TROPONINI 0.08* 0.09* 0.07*    HbA1C: Hgb A1c MFr Bld  Date/Time Value Ref Range Status  06/16/2016 01:54 PM 5.7 (H) <5.7 % Final    Comment:      For someone without known diabetes, a hemoglobin A1c value between 5.7% and 6.4% is consistent with prediabetes and should be confirmed with a follow-up test.   For someone with known diabetes, a value <7% indicates that their diabetes is well controlled. A1c targets should be individualized based on duration of diabetes, age, co-morbid conditions and other considerations.   This assay result is consistent with an increased risk of diabetes.   Currently, no consensus exists regarding use of hemoglobin A1c for diagnosis of diabetes in children.     03/01/2016 11:28 AM 5.2 <5.7 % Final    Comment:      For the purpose of screening for the presence of diabetes:   <5.7%       Consistent with the absence of diabetes 5.7-6.4 %   Consistent with increased risk for diabetes (prediabetes) >=6.5 %     Consistent with diabetes   This assay result is consistent with a decreased risk of diabetes.   Currently, no consensus exists regarding use of hemoglobin A1c for diagnosis of diabetes in  children.   According to American Diabetes Association (ADA) guidelines, hemoglobin A1c <7.0% represents optimal control in non-pregnant diabetic patients. Different metrics may apply to specific patient populations. Standards of Medical Care in Diabetes (ADA).        Recent Results (from the past 240 hour(s))  MRSA PCR Screening     Status: None   Collection Time: 07/07/16 10:23 PM  Result Value Ref Range Status   MRSA by PCR NEGATIVE NEGATIVE Final    Comment:        The GeneXpert MRSA Assay (FDA approved for NASAL specimens only), is one component of a comprehensive MRSA colonization surveillance program. It is not intended to diagnose MRSA infection nor to guide or monitor treatment for MRSA infections.      Scheduled Meds: . allopurinol  150 mg Oral Daily  . aspirin EC  81 mg Oral Daily  . budesonide (PULMICORT) nebulizer solution  0.25 mg Nebulization BID  . buPROPion  150 mg Oral BH-q7a  . digoxin  0.0625 mg Oral Daily  . diltiazem  120 mg Oral Daily  . gabapentin  300 mg Oral TID  . levothyroxine  50 mcg Oral QAC breakfast  . montelukast  10 mg Oral QHS  . pravastatin  40 mg Oral q1800  . umeclidinium-vilanterol  1 puff Inhalation Daily  . Warfarin - Pharmacist Dosing Inpatient   Does not apply q1800     LOS: 4 days   Cherene Altes, MD [facility] Office  [phone] Pager - Text Page per Amion as per below:  On-Call/Text Page:      [email or web address]      password [email or web address]  If 7PM-7AM, please contact night-coverage [email or web address] Password [facility] 07/11/2016, 10:41 AM

## 2016-07-11 NOTE — Progress Notes (Signed)
Pt is off the floor to see her daughter on [address]. Per MD order

## 2016-07-11 NOTE — Progress Notes (Signed)
On handoff with night RN did reassure patient that there is an order that she may leave unit with family members for 20 to 30 minutes at a time to visit her daughter on [address].

## 2016-07-11 NOTE — Progress Notes (Signed)
Day shift RN in room with night shift receiving report, patient on 1 liter of oxygen, says she got short of breath in the night and her oxygen saturations were in the 90's but she requested oxygen for comfort.  Patient says she feels very fluid overloaded, daughter is concerned about many issues.  States she wants to know why patient is "no better".  I assured her patient is better since she no longer has acute encephalopathy and is oriented x 4.  Electrolytes are also normal and kidney function improved.    Patient and daughter both VERY ANXIOUS, upset that patient has not been allowed to leave floor and visit her daughter on [place].  Advised patient that we have to have an order from the doctor for this to happen but we do not have staff that can leave the unit and take her there unfortunately.

## 2016-07-11 NOTE — Progress Notes (Signed)
ANTICOAGULATION CONSULT NOTE - Follow-Up  Pharmacy Consult for warfarin + lovenox Indication: atrial fibrillation  Patient Measurements: Height: 5\' 3"  (160 cm) Weight: 209 lb 9.6 oz (95.1 kg) IBW/kg (Calculated) : 52.4  Vital Signs: Temp: 98.6 F (37 C) (04/29 0541) Temp Source: Oral (04/29 0541) BP: 107/75 (04/29 1127) Pulse Rate: 87 (04/29 1127)  Labs:  Recent Labs  07/09/16 0239 07/10/16 0228 07/11/16 0402 07/11/16 0703  HGB  --  12.8  --  12.5  HCT  --  39.3  --  37.8  PLT  --  176  --  173  LABPROT 51.3* 22.1* 18.3*  --   INR 5.45* 1.91 1.50  --   CREATININE 1.59* 1.31* 1.18*  --   TROPONINI  --   --  0.07*  --     Estimated Creatinine Clearance: 45.9 mL/min (A) (by C-G formula based on SCr of 1.18 mg/dL (H)).   Assessment: 76 yo female on warfarin prior to arrival for atrial fibrillation admitted 4/25 with CP and confusion. Admit INR 3.91 with last dose of warfarin taken 4/25.  INR decreased from 5.45 to 1.91 following 1 mg vitamin K on 4/27. Hgb/Hct drop was noted but this is thought to be dilutional since the patient was dehydrated on admission and has been receiving IVF, plts wnl. No overt s/sx of bleeding noted. Levothyroxine dose was increased this admit which may increase the INR - recommend close monitoring.  INR remains SUBtherapeutic with continued trend down to 1.5 after restart warfarin yesterday. Vitamin K likely still having an effect. CBC remains wnl. No bleeding issues noted.  Since INR remains SUBtherapeutic, pharmacy consulted to dose lovenox until INR therapeutic.   Home dose: 3 mg daily  Goal of Therapy:  INR 2-3 Monitor platelets by anticoagulation protocol: Yes   Plan:  - Lovenox 100 mg Subcut q12h - Warfarin 2.5 mg PO again tonight - Daily INR, CBC - Will continue to monitor for any signs/symptoms of bleeding  Thank you for allowing pharmacy to be a part of this patient's care.   Ruta Hinds. Velva Harman, PharmD, BCPS, CPP Clinical  Pharmacist Pager: [phone] Phone: [phone] 07/11/2016 11:45 AM

## 2016-07-11 NOTE — Progress Notes (Signed)
VSS on 7 a to 7 p shift.  Patient denies pain, does feel better wearing O2 d/t shortness of breath.  Patient visited daughter on [address] and felt much better after this.  Patient tired and weak in the afternoon, napped some before walking in the hallway a short distance.  Patient states her right leg is weak off and on and is "acting up" today.  She states this is not new, has been like that since she had a stent placed several years ago and no one knows why.  Did persuade patient to use walker today and that she may need to use the bedside commode now that she is back on her Lasix, patient agreed.

## 2016-07-11 NOTE — Progress Notes (Signed)
Pt is alert and oriented with confusion to time and situation daughter at bedside and a little concerned. Stated that she had took a nap and got confused to time.

## 2016-07-12 DIAGNOSIS — I25110 Atherosclerotic heart disease of native coronary artery with unstable angina pectoris: Secondary | ICD-10-CM

## 2016-07-12 DIAGNOSIS — I482 Chronic atrial fibrillation: Secondary | ICD-10-CM

## 2016-07-12 DIAGNOSIS — I5021 Acute systolic (congestive) heart failure: Secondary | ICD-10-CM

## 2016-07-12 LAB — CBC
HCT: 37.4 % (ref 36.0–46.0)
Hemoglobin: 12.5 g/dL (ref 12.0–15.0)
MCH: 30.9 pg (ref 26.0–34.0)
MCHC: 33.4 g/dL (ref 30.0–36.0)
MCV: 92.3 fL (ref 78.0–100.0)
Platelets: 165 10*3/uL (ref 150–400)
RBC: 4.05 MIL/uL (ref 3.87–5.11)
RDW: 16.4 % — ABNORMAL HIGH (ref 11.5–15.5)
WBC: 8.6 10*3/uL (ref 4.0–10.5)

## 2016-07-12 LAB — PROTIME-INR
INR: 1.67
Prothrombin Time: 19.9 s — ABNORMAL HIGH (ref 11.4–15.2)

## 2016-07-12 LAB — ACTH STIMULATION, 3 TIME POINTS
Cortisol, 30 Min: 23.1 ug/dL
Cortisol, 60 Min: 28.9 ug/dL
Cortisol, Base: 3.4 ug/dL

## 2016-07-12 LAB — VITAMIN D 25 HYDROXY (VIT D DEFICIENCY, FRACTURES): Vit D, 25-Hydroxy: 84.1 ng/mL (ref 30.0–100.0)

## 2016-07-12 LAB — RENAL FUNCTION PANEL
Albumin: 2.8 g/dL — ABNORMAL LOW (ref 3.5–5.0)
Anion gap: 10 (ref 5–15)
BUN: 11 mg/dL (ref 6–20)
CHLORIDE: 101 mmol/L (ref 101–111)
CO2: 23 mmol/L (ref 22–32)
Calcium: 8.4 mg/dL — ABNORMAL LOW (ref 8.9–10.3)
Creatinine, Ser: 1.18 mg/dL — ABNORMAL HIGH (ref 0.44–1.00)
GFR, EST AFRICAN AMERICAN: 51 mL/min — AB (ref >60)
GFR, EST NON AFRICAN AMERICAN: 44 mL/min — AB (ref >60)
Glucose, Bld: 84 mg/dL (ref 65–99)
POTASSIUM: 4.1 mmol/L (ref 3.5–5.1)
Phosphorus: 2.3 mg/dL — ABNORMAL LOW (ref 2.5–4.6)
Sodium: 134 mmol/L — ABNORMAL LOW (ref 135–145)

## 2016-07-12 MED ORDER — ENOXAPARIN SODIUM 100 MG/ML ~~LOC~~ SOLN
1.0000 mg/kg | Freq: Two times a day (BID) | SUBCUTANEOUS | Status: DC
  Administered 2016-07-12 – 2016-07-14 (×6): 95 mg via SUBCUTANEOUS
  Filled 2016-07-12 (×7): qty 1

## 2016-07-12 MED ORDER — WARFARIN SODIUM 2.5 MG PO TABS
2.5000 mg | ORAL_TABLET | Freq: Once | ORAL | Status: DC
  Filled 2016-07-12: qty 1

## 2016-07-12 MED ORDER — METOPROLOL TARTRATE 12.5 MG HALF TABLET
12.5000 mg | ORAL_TABLET | Freq: Two times a day (BID) | ORAL | Status: DC
  Administered 2016-07-12 – 2016-07-16 (×8): 12.5 mg via ORAL
  Filled 2016-07-12 (×8): qty 1

## 2016-07-12 MED ORDER — FUROSEMIDE 20 MG PO TABS
20.0000 mg | ORAL_TABLET | Freq: Two times a day (BID) | ORAL | Status: DC
  Administered 2016-07-13 – 2016-07-14 (×3): 20 mg via ORAL
  Filled 2016-07-12 (×3): qty 1

## 2016-07-12 MED ORDER — FUROSEMIDE 10 MG/ML IJ SOLN
20.0000 mg | Freq: Once | INTRAMUSCULAR | Status: AC
  Administered 2016-07-12: 20 mg via INTRAVENOUS
  Filled 2016-07-12: qty 2

## 2016-07-12 NOTE — Progress Notes (Signed)
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  Subjective: The patient is sitting up in a bedside chair.  She reports a flare of her right lower extremity sciatica which she states is a chronic recurring issue.  She denies chest pain shortness of breath nausea vomiting or abdominal pain.  When asked she was states "I feel terrible" but when pushed to provide further details she can't really provide specific details.  She does admit to feeling weak in general and tired and these seem to be her predominant complaints.  Assessment & Plan:  Acute Encephalopathy / Gait Concerns Family reported intermittent episodes of confusion, unsteady gait, difficulty finding words - MRI brain w/o acute findings but w/ ?of cerebellar lesion (not likely related to AMS) - ammonia, B12, and folate levels normal - RPR and HIV negative - normal UA - suspect underlying dementia with superimposed delirium most likely due to hyponatremia and acute kidney injury/azotemia +/- hypothyroidism - cont PT/OT - follow mental status w/ tx of hypothyroidism, normalization of electrolytes, and volume resusciation - appears to be much improved in this regard - encouraged family to have pt seen by a Geriatric MD as an outpt once she has stabilized for a cognitive evaluation, but w/ acute correctable issues at play this would not be appropriate acutely   Chest pain in patient with known CAD TTE reveals inferior wall hypokinesis and systolic CHF which may be new - last TTE Oct 2017 noted preserved EF, but TTE Sept  2014 noted EF 50% - I can find no prior TTE noting inferior wall hypokinesis - I see no cath report on file - I have asked her Cardiologist to see her in consultation as she may be due a cardiac cath to evaluate this apparently new WMA, especially in light of substernal chest pressure she reported the night of 4/28 > 4/29 - EKG notes LBBB or similar block  Recent Labs Lab 07/07/16 2249 07/08/16 0559 07/11/16 0402  TROPONINI 0.08* 0.09* 0.07*    Chronic diastolic CHF and systolic CHF of unclear chronicity  EF 60-65% via TTE 12/24/2015 without specific qualification of diastolic function - TTE this admit w/ EF 45-50% w/ inferior wall hypokinesis - following daily weights - pt rapidly transitioned from signif DH to mild volume overload - no ACEi/ARB for now due to recent acute kidney injury - resumed low dose diuretic 4/29 - no gross overload at present    [facility] Weights   07/10/16 1900 07/11/16 0400 07/12/16 0355  Weight: 96.2 kg (212 lb) 95.1 kg (209 lb 9.6 oz) 95.2 kg (209 lb 14.4 oz)    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 not significantly changed from Oct 2017 - M is appreciable on exam   R cerebellar lesion of unclear etiology  Neurology consulted and feels "possibilities include demyelinating lesion versus CVA" - is felt to be old - suggests outpt Neuro f/o - has now signed off - consider f/u MRI as outpt in 6-8 weeks   Hyponatremia Appeared to be hypovolemic in nature - Sodium has normalized with gentle volume resuscitation - watch w/ NSL and resumption of  diuresis   Recent Labs Lab 07/08/16 0559 07/09/16 0239 07/10/16 0228 07/11/16 0402 07/12/16 0526  NA 128* 133* 135 135 134*    Hypokalemia due to diuretic therapy and poor intake - supplemented to normal range  Chronic Atrial fibrillation Rate controlled - continue usual medical therapy - begin lovenox until warfarin back to therapeutic range   AKI superimposed on CKD stage III baseline creatinine approximately  1.3 - creatinine has returned to her baseline with volume resuscitation  Recent Labs Lab 07/08/16 0559 07/09/16 0239 07/10/16 0228 07/11/16 0402 07/12/16 0526  CREATININE 1.98* 1.59* 1.31* 1.18* 1.18*    COPD continue home meds - compensated at this time  Hypothyroidism TSH 6 - reports being on same dose of synthroid "for years" - may simply be altered by acute illness - increased dose as her ROS suggests sx of hypothyroidism - discussed need to keep TSH at ~1.0     DVT prophylaxis: warfarin + lovenox  Code Status: FULL CODE Family Communication: No family present at time of exam today Disposition Plan: PT/OT - follow volume - Cards consult  Consultants:  Neurology  [facility]  Procedures: TTE - noted above   Antimicrobials:  none   Objective: Blood pressure 106/70, pulse 95, temperature 98.5 F (36.9 C), temperature source Oral, resp. rate 20, height 5\' 3"  (1.6 m), weight 95.2 kg (209 lb 14.4 oz), SpO2 92 %.  Intake/Output Summary (Last 24 hours) at 07/12/16 0934 Last data filed at 07/12/16 0356  Gross per 24 hour  Intake              240 ml  Output              600 ml  Net             -360 ml   Filed Weights   07/10/16 1900 07/11/16 0400 07/12/16 0355  Weight: 96.2 kg (212 lb) 95.1 kg (209 lb 9.6 oz) 95.2 kg (209 lb 14.4 oz)    Examination: General: No acute respiratory distress - alert and oriented Lungs: Clear to auscultation bilaterally - no wheezing  Cardiovascular: Regular rate with 2/6 systolic M - no gallup or rub  Abdomen: Nontender, nondistended, soft, bowel sounds positive, no rebound Extremities: 1+ B LE edema wch is stable   CBC:  Recent Labs Lab 07/07/16 1512 07/07/16 1941 07/08/16 0559 07/10/16 0228 07/11/16 0703 07/12/16 0526  WBC 10.9*  --  10.2 8.7 9.7 8.6  HGB 18.1*  --  15.3* 12.8 12.5 12.5  HCT 51.7* 48.5* 45.1 39.3 37.8 37.4  MCV 89.8  --  89.8 92.3 91.7 92.3  PLT 210  --  216 176 173 174   Basic Metabolic  Panel:  Recent Labs Lab 07/08/16 0559 07/09/16 0239 07/10/16 0228 07/11/16 0402 07/12/16 0526  NA 128* 133* 135 135 134*  K 2.7* 3.8 3.7 4.5 4.1  CL 83* 91* 99* 99* 101  CO2 33* 34* 30 27 23   GLUCOSE 107* 104* 108* 92 84  BUN 52* 36* 21* 11 11  CREATININE 1.98* 1.59* 1.31* 1.18* 1.18*  CALCIUM 9.2 8.9 8.6* 8.8* 8.4*  MG  --  2.1  --   --   --   PHOS  --   --   --   --  2.3*   GFR: Estimated Creatinine Clearance: 45.9 mL/min (A) (by C-G formula based on SCr of 1.18 mg/dL (H)).  Liver Function Tests:  Recent Labs Lab 07/10/16 0228 07/11/16 0402 07/12/16 0526  AST  22 25  --   ALT 19 22  --   ALKPHOS 31* 33*  --   BILITOT 0.7 1.1  --   PROT 5.1* 5.7*  --   ALBUMIN 2.9* 3.2* 2.8*    Recent Labs Lab 07/07/16 1941  AMMONIA 26    Coagulation Profile:  Recent Labs Lab 07/08/16 0559 07/09/16 0239 07/10/16 0228 07/11/16 0402 07/12/16 0526  INR 4.89* 5.45* 1.91 1.50 1.67    Cardiac Enzymes:  Recent Labs Lab 07/07/16 2249 07/08/16 0559 07/11/16 0402  TROPONINI 0.08* 0.09* 0.07*    HbA1C: Hgb A1c MFr Bld  Date/Time Value Ref Range Status  06/16/2016 01:54 PM 5.7 (H) <5.7 % Final    Comment:      For someone without known diabetes, a hemoglobin A1c value between 5.7% and 6.4% is consistent with prediabetes and should be confirmed with a follow-up test.   For someone with known diabetes, a value <7% indicates that their diabetes is well controlled. A1c targets should be individualized based on duration of diabetes, age, co-morbid conditions and other considerations.   This assay result is consistent with an increased risk of diabetes.   Currently, no consensus exists regarding use of hemoglobin A1c for diagnosis of diabetes in children.     03/01/2016 11:28 AM 5.2 <5.7 % Final    Comment:      For the purpose of screening for the presence of diabetes:   <5.7%       Consistent with the absence of diabetes 5.7-6.4 %   Consistent with  increased risk for diabetes (prediabetes) >=6.5 %     Consistent with diabetes   This assay result is consistent with a decreased risk of diabetes.   Currently, no consensus exists regarding use of hemoglobin A1c for diagnosis of diabetes in children.   According to American Diabetes Association (ADA) guidelines, hemoglobin A1c <7.0% represents optimal control in non-pregnant diabetic patients. Different metrics may apply to specific patient populations. Standards of Medical Care in Diabetes (ADA).        Recent Results (from the past 240 hour(s))  MRSA PCR Screening     Status: None   Collection Time: 07/07/16 10:23 PM  Result Value Ref Range Status   MRSA by PCR NEGATIVE NEGATIVE Final    Comment:        The GeneXpert MRSA Assay (FDA approved for NASAL specimens only), is one component of a comprehensive MRSA colonization surveillance program. It is not intended to diagnose MRSA infection nor to guide or monitor treatment for MRSA infections.      Scheduled Meds: . allopurinol  150 mg Oral Daily  . aspirin EC  81 mg Oral Daily  . budesonide (PULMICORT) nebulizer solution  0.25 mg Nebulization BID  . buPROPion  150 mg Oral BH-q7a  . digoxin  0.0625 mg Oral Daily  . diltiazem  120 mg Oral Daily  . enoxaparin (LOVENOX) injection  1 mg/kg Subcutaneous Q12H  . furosemide  20 mg Oral BID  . gabapentin  200 mg Oral TID  . levothyroxine  50 mcg Oral QAC breakfast  . montelukast  10 mg Oral QHS  . pravastatin  40 mg Oral q1800  . umeclidinium-vilanterol  1 puff Inhalation Daily  . warfarin  2.5 mg Oral ONCE-1800  . Warfarin - Pharmacist Dosing Inpatient   Does not apply q1800     LOS: 5 days   Cherene Altes, MD [facility] Office  [phone] Pager - Text Page per Shea Evans  as per below:  On-Call/Text Page:      [email or web address]      password [email or web address]  If 7PM-7AM, please contact night-coverage [email or web address] Password [facility] 07/12/2016, 9:34 AM

## 2016-07-12 NOTE — Progress Notes (Signed)
PT Cancellation Note  Patient Details Name: Katie [name] MRN: [id number] DOB: [birth year]   Cancelled Treatment:    Reason Eval/Treat Not Completed: Other (comment).  PT has made multiple attempts and have met with conflict with meal, with other staff meeting her and another meal.  Will try later as time and pt allow.  Pt is mainly concerned with her sciatic pain which has been managed with Tylenol and have reported to her that the chronic pain will be more addressed with her discharge from hospital.   Ramond Dial 07/12/2016, 1:08 PM   Mee Hives, PT MS Acute Rehab Dept. Number: [place] [facility] [place]

## 2016-07-12 NOTE — Progress Notes (Signed)
Spoke to Lab tech about time medication and Lab to be drawn. Stated that Lab tech will here around 0515.

## 2016-07-12 NOTE — Progress Notes (Signed)
07/12/16  1430  Paged Dr Thereasa Solo to see if he still wanted to restart Coumadin, since patient is possibly going to have heart cath later this week. Waiting for response from MD.

## 2016-07-12 NOTE — Care Management Important Message (Signed)
Important Message  Patient Details  Name: Katie [name] MRN: [id number] Date of Birth: 04-25-1940   Medicare Important Message Given:  Yes    Bayleigh Loflin 07/12/2016, 11:22 AM

## 2016-07-12 NOTE — Progress Notes (Signed)
Per pt's family, pt has been having tremors. Pt seen with fine tremors. MD notified.

## 2016-07-12 NOTE — Evaluation (Signed)
Occupational Therapy Evaluation Patient Details Name: Katie [name] MRN: [id number] DOB: Jun 19, 1940 Today's Date: 07/12/2016    History of Present Illness Patient is a 76 yo female admitted 07/07/16 with chest pain, headache, confusion.  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      PMH:  CAD, CHF, aortic aneurysm, obesity, CKD, pulmonary fibrosis, Afib, COPD, HTN   Clinical Impression   Pt in recliner upon arrival and states that she needs to use the restroom. PTA, pt reports being independent and living with her husband. Currently, pt requires Min A for LB ADLs and functional mobility. Pt performs LB ADLs with Min A to don sock on RLE. Pt requires single hand held A to transfer to [facility]. Pt performs toilet hygiene and clothing management with Min guard for balance and safety. Pt declines to perform further ADLs due to sciatica nerve pain in her RLE. Pt would benefit from further OT to increase her independence and safety with ADls and functional mobility. Currently, recommending dc to SNF to increase her occupational performance and safety.    Follow Up Recommendations  SNF;Supervision/Assistance - 24 hour (Pending pt progress. She states she doesn't want to go to SNF because her husband depends on her.)    Equipment Recommendations  Other (comment) (Defer to next venue)    Recommendations for Other Services PT consult     Precautions / Restrictions Precautions Precautions: Fall Restrictions Weight Bearing Restrictions: No      Mobility Bed Mobility               General bed mobility comments: In recliner upon arrival  Transfers Overall transfer level: Needs assistance Equipment used: 1 person hand held assist Transfers: Sit to/from Stand Sit to Stand: Min assist         General transfer comment: Assistance for safety and balance.     Balance Overall balance assessment: Needs  assistance Sitting-balance support: Feet supported;No upper extremity supported Sitting balance-Leahy Scale: Good Sitting balance - Comments: Donned socks on LLE   Standing balance support: During functional activity;Single extremity supported Standing balance-Leahy Scale: Poor Standing balance comment: Requires UE support for standing balance                           ADL either performed or assessed with clinical judgement   ADL Overall ADL's : Needs assistance/impaired Eating/Feeding: Set up;Sitting   Grooming: Set up;Sitting   Upper Body Bathing: Set up;Sitting   Lower Body Bathing: Moderate assistance;Sit to/from stand   Upper Body Dressing : Set up;Sitting   Lower Body Dressing: Moderate assistance;Sit to/from stand   Toilet Transfer: Minimal assistance;BSC;Ambulation (Single hand held A.) Toilet Transfer Details (indicate cue type and reason): Pt required single hand held A to walk a few steps towards BSC. Pt experiencing urinary urgency Toileting- Clothing Manipulation and Hygiene: Min guard;Sit to/from stand         General ADL Comments: Pt demonstrating limited occupational performance and activity tolerance due to SOB and RLE pain.     Vision Baseline Vision/History: Wears glasses Wears Glasses: Reading only Patient Visual Report: No change from baseline       Perception     Praxis      Pertinent Vitals/Pain Pain Assessment: Faces Faces Pain Scale: Hurts even more Pain Location: RLE - sciatic nerve Pain Descriptors / Indicators: Constant;Discomfort;Grimacing;Guarding Pain Intervention(s): Limited activity within patient's tolerance     Hand  Dominance Right   Extremity/Trunk Assessment Upper Extremity Assessment Upper Extremity Assessment: Generalized weakness   Lower Extremity Assessment Lower Extremity Assessment: Generalized weakness;RLE deficits/detail RLE Deficits / Details: sciatic nerve pain  RLE: Unable to fully assess due to  pain RLE Coordination: decreased fine motor;decreased gross motor       Communication Communication Communication: No difficulties   Cognition Arousal/Alertness: Awake/alert Behavior During Therapy: WFL for tasks assessed/performed Overall Cognitive Status: Within Functional Limits for tasks assessed                                     General Comments  Pt experiencing SOB and requested 2L O2 via VC. Notified RN    Exercises     Shoulder Instructions      Home Living Family/patient expects to be discharged to:: Private residence Living Arrangements: Spouse/significant other (Husband with past TBI) Available Help at Discharge: Family;Available PRN/intermittently (Daughter available to come PRN) Type of Home: House ("[place] farm house") Home Access: Stairs to enter Technical brewer of Steps: 5 Entrance Stairs-Rails: Right;Left Home Layout: One level     Bathroom Shower/Tub: Teacher, early years/pre: Standard Bathroom Accessibility: Yes   Home Equipment: Environmental consultant - 2 wheels;Walker - 4 wheels;Cane - single point;Bedside commode;Grab bars - tub/shower (PT says a tub shair will not fit in her tub. )          Prior Functioning/Environment Level of Independence: Independent        Comments: Pt performed ADLs, IADLs, driving, and caregiving for husband.        OT Problem List: Decreased strength;Decreased activity tolerance;Impaired balance (sitting and/or standing);Decreased range of motion;Pain;Decreased safety awareness;Decreased knowledge of use of DME or AE      OT Treatment/Interventions: Self-care/ADL training;Therapeutic exercise;Energy conservation;DME and/or AE instruction;Therapeutic activities;Patient/family education    OT Goals(Current goals can be found in the care plan section) Acute Rehab OT Goals Patient Stated Goal: To return home OT Goal Formulation: With patient Time For Goal Achievement: 07/26/16 Potential to Achieve  Goals: Good ADL Goals Pt Will Perform Grooming: with set-up;with supervision;standing Pt Will Perform Upper Body Dressing: sitting;with modified independence Pt Will Perform Lower Body Dressing: with min guard assist;sit to/from stand Pt Will Transfer to Toilet: with min guard assist;ambulating;bedside commode Pt Will Perform Tub/Shower Transfer: Tub transfer;3 in 1;rolling walker;ambulating;with min guard assist  OT Frequency: Min 2X/week   Barriers to D/C: Decreased caregiver support  Pt provides care for her husband. Her daughter may be able to come provide support but only PRN.       Co-evaluation              End of Session Equipment Utilized During Treatment: Gait belt;Rolling walker;Oxygen (2L) Nurse Communication: Mobility status;Other (comment) (O2 via [place])  Activity Tolerance: Patient limited by pain Patient left: in chair;with call bell/phone within reach  OT Visit Diagnosis: Unsteadiness on feet (R26.81);Muscle weakness (generalized) (M62.81);Pain Pain - Right/Left: Right Pain - part of body: Leg                Time: 5625-6389 OT Time Calculation (min): 22 min Charges:  OT General Charges $OT Visit: 1 Procedure OT Evaluation $OT Eval Low Complexity: 1 Procedure G-Codes:     Tammee Thielke, OTR/L [phone]  [place] 07/12/2016, 10:03 AM

## 2016-07-12 NOTE — Progress Notes (Addendum)
ANTICOAGULATION CONSULT NOTE - Follow-Up  Pharmacy Consult for warfarin + lovenox Indication: atrial fibrillation  Patient Measurements: Height: 5\' 3"  (160 cm) Weight: 209 lb 14.4 oz (95.2 kg) IBW/kg (Calculated) : 52.4  Vital Signs: Temp: 98.5 F (36.9 C) (04/30 0355) Temp Source: Oral (04/30 0355) BP: 114/62 (04/30 0355) Pulse Rate: 86 (04/30 0355)  Labs:  Recent Labs  07/10/16 0228 07/11/16 0402 07/11/16 0703 07/12/16 0526  HGB 12.8  --  12.5 12.5  HCT 39.3  --  37.8 37.4  PLT 176  --  173 165  LABPROT 22.1* 18.3*  --  19.9*  INR 1.91 1.50  --  1.67  CREATININE 1.31* 1.18*  --  1.18*  TROPONINI  --  0.07*  --   --     Estimated Creatinine Clearance: 45.9 mL/min (A) (by C-G formula based on SCr of 1.18 mg/dL (H)).   Assessment: 76 yo female on warfarin PTA for atrial fibrillation admitted 4/25 with CP and confusion. Admit INR 3.91 with last dose of warfarin taken 4/25.  INR decreased from 5.45 to 1.91 following 1 mg vitamin K on 4/27. Hgb/Hct drop was noted but this is thought to be dilutional since the patient was dehydrated on admission and has been receiving IVF, plts wnl. No overt s/sx of bleeding noted. Levothyroxine dose was increased this admit which may increase the INR - recommend close monitoring.  INR remains SUBtherapeutic, now trending back up to 1.67 after restarting warfarin 4/28. Vitamin K may still be having an effect. CBC remains wnl. No bleeding issues noted.  Since INR remains SUBtherapeutic, pharmacy consulted to dose lovenox until INR therapeutic. Weight down slightly, now ~95 kg.  Home dose: 3 mg daily  Goal of Therapy:  INR 2-3 Monitor platelets by anticoagulation protocol: Yes   Plan:  - Lovenox to 95 mg (~1mg /kg) [place] q12h - Warfarin 2.5 mg PO x1 again tonight - Daily INR - Will continue to monitor CBC at least q72h on Lovenox, signs/symptoms of bleeding   Elicia Lamp, PharmD, BCPS Clinical Pharmacist 07/12/2016 8:49 AM

## 2016-07-12 NOTE — Progress Notes (Signed)
ACTH Lab drawn now

## 2016-07-12 NOTE — Progress Notes (Signed)
Physical Therapy Treatment Patient Details Name: Katie [name] MRN: [id number] DOB: 1940/06/06 Today's Date: 07/12/2016    History of Present Illness Patient is a 76 yo female admitted 07/07/16 with chest pain, headache, confusion.  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      PMH:  CAD, CHF, aortic aneurysm, obesity, CKD, pulmonary fibrosis, Afib, COPD, HTN    PT Comments    Pt is distressed that she continues to have pain on L hip and leg, but is also not leaving yet.  May have issues managing at home, unless sufficient help is there.  Has plan to continue acute therapy and will see how her ability to climb steps at her entrance of home progresses.  Her ability will either confirm or limit her plan to go directly home.  May need to change to SNF if she cannot climb 5 steps.   Follow Up Recommendations  Home health PT;Supervision for mobility/OOB     Equipment Recommendations  Rolling walker with 5" wheels (if no good device is at home)    Recommendations for Other Services       Precautions / Restrictions Precautions Precautions: Fall Precaution Comments: pt concern about her LLE weakness and fall risk Restrictions Weight Bearing Restrictions: No    Mobility  Bed Mobility               General bed mobility comments: up in chair when PT arrived  Transfers       Sit to Stand: Min assist         General transfer comment: Assistance for safety and balance.   Ambulation/Gait Ambulation/Gait assistance: Min assist;Min guard Ambulation Distance (Feet): 20 Feet Assistive device: Rolling walker (2 wheeled) Gait Pattern/deviations: Step-through pattern;Decreased stride length;Shuffle Gait velocity: decreased Gait velocity interpretation: Below normal speed for age/gender General Gait Details: slow with gait but cannot turn well and is slow to  plan a path with walker   Stairs             Wheelchair Mobility    Modified Rankin (Stroke Patients Only)       Balance     Sitting balance-Leahy Scale: Good       Standing balance-Leahy Scale: Poor                              Cognition Arousal/Alertness: Awake/alert Behavior During Therapy: WFL for tasks assessed/performed Overall Cognitive Status: Within Functional Limits for tasks assessed                                        Exercises      General Comments General comments (skin integrity, edema, etc.): had no O2 issues but used her line to maintain 2L per min via cannula      Pertinent Vitals/Pain Pain Assessment: Faces Faces Pain Scale: Hurts little more Pain Location: RLE - sciatic nerve Pain Descriptors / Indicators: Aching;Sore;Spasm Pain Intervention(s): Monitored during session;Premedicated before session;Repositioned    Home Living                      Prior Function            PT Goals (current goals can now be found in the care plan section) Acute Rehab PT Goals Patient Stated Goal: To  return home Progress towards PT goals: Progressing toward goals (more pain and needed more assistance to walk)    Frequency    Min 3X/week      PT Plan      Co-evaluation              AM-PAC PT "6 Clicks" Daily Activity  Outcome Measure  Difficulty turning over in bed (including adjusting bedclothes, sheets and blankets)?: Total Difficulty moving from lying on back to sitting on the side of the bed? : Total Difficulty sitting down on and standing up from a chair with arms (e.g., wheelchair, bedside commode, etc,.)?: Total Help needed moving to and from a bed to chair (including a wheelchair)?: A Little Help needed walking in hospital room?: A Little Help needed climbing 3-5 steps with a railing? : A Lot 6 Click Score: 11    End of Session         PT Visit Diagnosis: Unsteadiness on feet (R26.81);Other abnormalities of gait and mobility  (R26.89);Muscle weakness (generalized) (M62.81)     Time: 2811-8867 PT Time Calculation (min) (ACUTE ONLY): 32 min  Charges:  $Gait Training: 8-22 mins $Therapeutic Activity: 8-22 mins                    G Codes:        Ramond Dial Jul 25, 2016, 2:39 PM   Mee Hives, PT MS Acute Rehab Dept. Number: [place] and [place]

## 2016-07-12 NOTE — Consult Note (Signed)
Cardiology Consult    Patient ID: Katie [name] MRN: [id number], DOB/AGE: [birth year]   Admit date: 07/07/2016 Date of Consult: 07/12/2016  Primary Physician: [name], MD Primary Cardiologist: Lovena Le  Requesting Provider: Thereasa Solo Reason for Consultation: CHF  Katie [name] is a 76 y.o. female who is being seen today for the evaluation of CHF at the request of Dr. Thereasa Solo.   Patient Profile    76 yo female with PMH of CAD, diastolic HF, abd aortic aneurysm s/p repair, CKD, pulmonary fibrosis, permanent AF, HTN and GERD who presented with chest pain, and HF.   Past Medical History   Past Medical History:  Diagnosis Date  . Atrial fibrillation ([place])   . Migraine headache   . Osteopenia     Past Surgical History:  Procedure Laterality Date  . ABDOMINAL AORTIC ENDOVASCULAR STENT GRAFT N/A 04/11/2013   Procedure: ABDOMINAL AORTIC ENDOVASCULAR STENT GRAFT WITH RIGHT FEMORAL PATCH ANGIOPLASTY;  Surgeon: Mal Misty, MD;  Location: [facility];  Service: Vascular;  Laterality: N/A;  . BREAST SURGERY     LEFT BREAST BIOPSY  . broken leg Left 1970s  . CARDIAC CATHETERIZATION    . CHOLECYSTECTOMY  1972  . COLONOSCOPY    . EYE SURGERY Bilateral   . Laser vein procedure    . REFRACTIVE SURGERY    . TONSILLECTOMY       Allergies  Allergies  Allergen Reactions  . Amiodarone Other (See Comments)    PULMONARY TOXICITY  . Diovan [Valsartan] Other (See Comments)    HYPOTENSION  . Doxycycline Diarrhea and Other (See Comments)    VISUAL DISTURBANCE  . Flexeril [Cyclobenzaprine] Other (See Comments)    FATIGUE  . Keflex [Cephalexin] Diarrhea  . Verapamil Other (See Comments)    EDEMA  . Advair Diskus [Fluticasone-Salmeterol] Other (See Comments)    SKIN CHANGES.  Can tolerate low dose of prednisone without complications  . Codeine Other (See Comments)    unknown    History of Present Illness    76 yo female with PMH of CAD, diastolic HF, abd aortic aneurysm s/p  repair, CKD, pulmonary fibrosis, permanent AF, HTN and GERD. She is followed by Dr. Lovena Le for her AFib, and HF. Her last office visit was in 11/17 where she reported dietary indiscretion, and increased sodium intake. Last Echo in 10/17 showed normal EF with septal WMA.   She presented to the ED on 07/07/16 with reports of chest pain, confusion and headache. Stated she was increasing her diuretic therapy at home and was able to lose 8 lbs with fliud. Daughter reported over the past couple of months her mother has been having difficulty finding her words. At 430am on the day of admission daughter reported Katie [name] was not acting like herself, was confused and had an episode of urinary incontinence. In the ED her labs showed Na+ 129 Cr 2.11, INR 3.91, Trop 0.10. CXR with no acute findings. EKG AFib with controlled ventricular rate. She was given IVFs, and admitted for further work up. TTE this admission showed reduced EF of 45-50% with inferior wall hypokinesis. In talking with Katie [name] she reports having had an episode of left sided chest pain that radiated under her left breast about 2 days ago, and has had intermittent chest pressure since that time. Tells me she is generally independent at home, cooks, cleans and helps care for her husband. Has had episodes of chest pain with these activities over the past couple of weeks.  Inpatient Medications    . allopurinol  150 mg Oral Daily  . aspirin EC  81 mg Oral Daily  . budesonide (PULMICORT) nebulizer solution  0.25 mg Nebulization BID  . buPROPion  150 mg Oral BH-q7a  . digoxin  0.0625 mg Oral Daily  . diltiazem  120 mg Oral Daily  . enoxaparin (LOVENOX) injection  1 mg/kg Subcutaneous Q12H  . furosemide  20 mg Oral BID  . gabapentin  200 mg Oral TID  . levothyroxine  50 mcg Oral QAC breakfast  . montelukast  10 mg Oral QHS  . pravastatin  40 mg Oral q1800  . umeclidinium-vilanterol  1 puff Inhalation Daily  . warfarin  2.5 mg Oral ONCE-1800    . Warfarin - Pharmacist Dosing Inpatient   Does not apply q1800    Family History    Family History  Problem Relation Age of Onset  . Cirrhosis Mother   . Cancer Mother 90    PANCREAS  . Heart defect Sister   . Breast cancer Sister     age 61  . Heart disease Sister   . Stroke Sister   . Alcohol abuse Father   . Depression Father   . Hypertension Brother   . Hyperlipidemia Son   . Heart disease Daughter     Social History    Social History   Social History  . Marital status: Married    Spouse name: N/A  . Number of children: N/A  . Years of education: N/A   Occupational History  . Not on file.   Social History Main Topics  . Smoking status: Former Smoker    Types: Cigarettes    Quit date: 08/10/2002  . Smokeless tobacco: Never Used     Comment: History of tobacco abuse  . Alcohol use 0.0 oz/week     Comment: rare  . Drug use: No  . Sexual activity: Yes    Birth control/ protection: Post-menopausal   Other Topics Concern  . Not on file   Social History Narrative   Lives in [place] with husband   One daughter     Review of Systems    All other systems reviewed and are otherwise negative except as noted above.  Physical Exam    Blood pressure 106/70, pulse 95, temperature 98.5 F (36.9 C), temperature source Oral, resp. rate 20, height 5\' 3"  (1.6 m), weight 209 lb 14.4 oz (95.2 kg), SpO2 92 %.  General: Pleasant older WF, NAD Psych: Normal affect. Neuro: Alert and oriented X 3. Moves all extremities spontaneously. HEENT: Normal  Neck: Supple without bruits. mild JVD. Lungs:  Resp regular and unlabored, Diminished bilaterally. Heart: Irreg Irreg no s3, s4, 3/6 systolic murmurs. Abdomen: Soft, non-tender, non-distended, BS + x 4.  Extremities: No clubbing, cyanosis 1+ pitting LE edema R>L. DP/PT/Radials 2+ and equal bilaterally.  Labs    Troponin (Point of Care Test) No results for input(s): TROPIPOC in the last 72 hours.  Recent Labs   07/11/16 0402  TROPONINI 0.07*   Lab Results  Component Value Date   WBC 8.6 07/12/2016   HGB 12.5 07/12/2016   HCT 37.4 07/12/2016   MCV 92.3 07/12/2016   PLT 165 07/12/2016    Recent Labs Lab 07/11/16 0402 07/12/16 0526  NA 135 134*  K 4.5 4.1  CL 99* 101  CO2 27 23  BUN 11 11  CREATININE 1.18* 1.18*  CALCIUM 8.8* 8.4*  PROT 5.7*  --   BILITOT 1.1  --  ALKPHOS 33*  --   ALT 22  --   AST 25  --   GLUCOSE 92 84   Lab Results  Component Value Date   CHOL 121 07/09/2016   HDL 28 (L) 07/09/2016   LDLCALC 44 07/09/2016   TRIG 246 (H) 07/09/2016   No results found for: [facility]   Radiology Studies    Dg Chest 2 View  Result Date: 07/07/2016 CLINICAL DATA:  Mid chest pain, dizziness, low blood pressure chest x-ray 411 1,018 EXAM: CHEST  2 VIEW COMPARISON:  None. FINDINGS: The lungs remain clear and somewhat hyperaerated. Mediastinal and hilar contours are unremarkable. Mild cardiomegaly is stable. No acute bony abnormality is seen. IMPRESSION: Stable cardiomegaly and hyperaeration.  No active lung disease. Electronically Signed   By: Ivar Drape M.D.   On: 07/07/2016 13:41   Dg Chest 2 View  Result Date: 06/23/2016 CLINICAL DATA:  Dyspnea EXAM: CHEST  2 VIEW COMPARISON:  06/01/2016 chest radiograph. FINDINGS: Partially visualized abdominal aortic stent graft in the upper abdominal aorta. Stable cardiomediastinal silhouette with mild cardiomegaly and aortic atherosclerosis. No pneumothorax. No pleural effusion. Emphysema. Borderline lung hyperinflation. No pulmonary edema. No acute consolidative airspace disease. IMPRESSION: 1. Emphysema and borderline lung hyperinflation, which may indicate COPD. No acute pulmonary disease. 2. Stable cardiomegaly without pulmonary edema. 3. Aortic atherosclerosis. Electronically Signed   By: Ilona Sorrel M.D.   On: 06/23/2016 10:31   Mr Brain Wo Contrast  Result Date: 07/07/2016 CLINICAL DATA:  Encephalopathy. Word-finding difficulties for  a few months, more forgetful. History of atrial fibrillation, migraine headache, pulmonary fibrosis. EXAM: MRI HEAD WITHOUT CONTRAST TECHNIQUE: Multiplanar, multiecho pulse sequences of the brain and surrounding structures were obtained without intravenous contrast. GFR 22. COMPARISON:  CT HEAD January 12, 2014 FINDINGS: BRAIN: No reduced diffusion to suggest acute ischemia. No susceptibility artifact to suggest hemorrhage. 9 mm rounded focus of bright T2 and low T1 signal LEFT cerebellum with intermediate FLAIR signal and, T2 shine through. Patchy supratentorial white matter FLAIR T2 hyperintensities without midline shift or mass effect. Ventricles and sulci are normal for patient's age. No abnormal extra-axial fluid collections. VASCULAR: Normal major intracranial vascular flow voids present at skull base. SKULL AND UPPER CERVICAL SPINE: No abnormal sellar expansion. No suspicious calvarial bone marrow signal. Craniocervical junction maintained. SINUSES/ORBITS: The mastoid air-cells and included paranasal sinuses are well-aerated. The included ocular globes and orbital contents are non-suspicious. Status post bilateral ocular lens implants. OTHER: Patient is edentulous. IMPRESSION: 9 mm LEFT cerebellar focus of apparent demyelination though, atypical for a patient of this age. Findings less likely represent subacute infarct. Given patient's GFR 22, consider consultation with nephrology prior to contrast administration. Recommend follow-up MRI of the brain in 6-8 weeks, with contrast if able. Moderate chronic small vessel ischemic disease. Electronically Signed   By: Elon Alas M.D.   On: 07/07/2016 19:09   Mr Cervical Spine Wo Contrast  Result Date: 07/08/2016 CLINICAL DATA:  Follow-up abnormal brain MRI. A few months of word-finding difficulties and forgetfulness. History of migraine headaches and atrial fibrillation. Possible LEFT cerebellar demyelination. Assess for spinal demyelination. EXAM: MRI  CERVICAL, THORACIC SPINE WITHOUT CONTRAST TECHNIQUE: Multiplanar and multiecho pulse sequences of the cervical spine, to include the craniocervical junction and cervicothoracic junction, and thoracic spine, were obtained without intravenous contrast. COMPARISON:  MRI of the head July 07, 2016 FINDINGS: MRI CERVICAL SPINE FINDINGS ALIGNMENT: Straightened cervical lordosis.  No malalignment. VERTEBRAE/DISCS: Vertebral bodies are intact. Moderate C4-5 and C5-6 disc height loss,  mild at C6-7 with mild desiccation and mild subacute on chronic discogenic endplate changes. Minimal chronic discogenic endplate changes X5-1. No abnormal or acute bone marrow signal. CORD:Cervical spinal cord is normal morphology and signal characteristics from the cervicomedullary junction to level of T1-2, the most caudal well visualized level. POSTERIOR FOSSA, VERTEBRAL ARTERIES, PARASPINAL TISSUES: No MR findings of ligamentous injury. Vertebral artery flow voids present. Included posterior fossa and paraspinal soft tissues are normal. DISC LEVELS (mildly motion degraded axial sequences): C2-3: Uncovertebral hypertrophy and mild facet arthropathy without canal stenosis or neural foraminal narrowing. C3-4: Annular bulging, uncovertebral hypertrophy. Moderate facet arthropathy without canal stenosis or neural foraminal narrowing. C4-5: Small broad-based disc bulge, uncovertebral hypertrophy mild facet arthropathy. Mild canal stenosis. Moderate neural foraminal narrowing. C5-6: Small broad-based disc bulge, uncovertebral hypertrophy. Mild canal stenosis. Moderate neural foraminal narrowing. C6-7: Small broad-based disc bulge, uncovertebral hypertrophy. No canal stenosis. Moderate LEFT neural foraminal narrowing. Subcentimeter RIGHT C7 perineural cyst. C7-T1: No disc bulge, canal stenosis nor neural foraminal narrowing. Moderate to severe LEFT facet arthropathy. Subcentimeter C8 perineural cysts. MRI THORACIC SPINE FINDINGS ALIGNMENT:  Maintenance of the thoracic kyphosis. No malalignment. VERTEBRAE/DISCS: Vertebral bodies are intact. Mild C4-5 and C5-6 disc height loss, disc desiccation all thoracic disc compatible with degenerative discs. Mild subacute on chronic discogenic endplate changes Z0-0. No abnormal or acute bone marrow signal. Scattered old Schmorl's nodes. Scattered hemangiomata. CORD: Thoracic spinal cord is normal morphology and signal characteristics to the level of the conus medullaris which terminates at T12-L1. Ventral spinal cord deformity at T5-6 as described below. Multilevel subcentimeter perineural cysts. PREVERTEBRAL AND PARASPINAL SOFT TISSUES: Similar are mild subcarinal lymphadenopathy. Ectatic thoracic aorta. DISC LEVELS: T4-5: Small RIGHT central disc protrusion without canal stenosis or neural foraminal narrowing. T5-6: 5 x 9 x 11 mm RIGHT central disc extrusion, calcified on prior CT results and mild canal stenosis and ventral cord deformity. No neural foraminal narrowing. T6-7: Small central disc protrusion without canal stenosis or neural foraminal narrowing. T10-11: Tiny LEFT central disc protrusion. No disc bulge, canal stenosis nor neural foraminal narrowing at any remaining level. IMPRESSION: MRI CERVICAL SPINE: No findings of demyelination, contrast enhanced sequences are more sensitive for acute demyelination. 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 Moderate C4-5 through C6-7 neural foraminal narrowing. MRI THORACIC SPINE: No findings of demyelination, contrast enhanced sequences are more sensitive for acute demyelination. Re- demonstration of 5 x 9 x 11 mm T5-6 calcified extrusion resulting in mild canal stenosis. Electronically Signed   By: Elon Alas M.D.   On: 07/08/2016 20:59   Mr Thoracic Spine Wo Contrast  Result Date: 07/08/2016 CLINICAL DATA:  Follow-up abnormal brain MRI. A few months of word-finding difficulties and forgetfulness. History of migraine  headaches and atrial fibrillation. Possible LEFT cerebellar demyelination. Assess for spinal demyelination. EXAM: MRI CERVICAL, THORACIC SPINE WITHOUT CONTRAST TECHNIQUE: Multiplanar and multiecho pulse sequences of the cervical spine, to include the craniocervical junction and cervicothoracic junction, and thoracic spine, were obtained without intravenous contrast. COMPARISON:  MRI of the head July 07, 2016 FINDINGS: MRI CERVICAL SPINE FINDINGS ALIGNMENT: Straightened cervical lordosis.  No malalignment. VERTEBRAE/DISCS: Vertebral bodies are intact. Moderate C4-5 and C5-6 disc height loss, mild at C6-7 with mild desiccation and mild subacute on chronic discogenic endplate changes. Minimal chronic discogenic endplate changes F7-4. No abnormal or acute bone marrow signal. CORD:Cervical spinal cord is normal morphology and signal characteristics from the cervicomedullary junction to level of T1-2, the most caudal well visualized level. POSTERIOR  FOSSA, VERTEBRAL ARTERIES, PARASPINAL TISSUES: No MR findings of ligamentous injury. Vertebral artery flow voids present. Included posterior fossa and paraspinal soft tissues are normal. DISC LEVELS (mildly motion degraded axial sequences): C2-3: Uncovertebral hypertrophy and mild facet arthropathy without canal stenosis or neural foraminal narrowing. C3-4: Annular bulging, uncovertebral hypertrophy. Moderate facet arthropathy without canal stenosis or neural foraminal narrowing. C4-5: Small broad-based disc bulge, uncovertebral hypertrophy mild facet arthropathy. Mild canal stenosis. Moderate neural foraminal narrowing. C5-6: Small broad-based disc bulge, uncovertebral hypertrophy. Mild canal stenosis. Moderate neural foraminal narrowing. C6-7: Small broad-based disc bulge, uncovertebral hypertrophy. No canal stenosis. Moderate LEFT neural foraminal narrowing. Subcentimeter RIGHT C7 perineural cyst. C7-T1: No disc bulge, canal stenosis nor neural foraminal narrowing.  Moderate to severe LEFT facet arthropathy. Subcentimeter C8 perineural cysts. MRI THORACIC SPINE FINDINGS ALIGNMENT: Maintenance of the thoracic kyphosis. No malalignment. VERTEBRAE/DISCS: Vertebral bodies are intact. Mild C4-5 and C5-6 disc height loss, disc desiccation all thoracic disc compatible with degenerative discs. Mild subacute on chronic discogenic endplate changes K9-3. No abnormal or acute bone marrow signal. Scattered old Schmorl's nodes. Scattered hemangiomata. CORD: Thoracic spinal cord is normal morphology and signal characteristics to the level of the conus medullaris which terminates at T12-L1. Ventral spinal cord deformity at T5-6 as described below. Multilevel subcentimeter perineural cysts. PREVERTEBRAL AND PARASPINAL SOFT TISSUES: Similar are mild subcarinal lymphadenopathy. Ectatic thoracic aorta. DISC LEVELS: T4-5: Small RIGHT central disc protrusion without canal stenosis or neural foraminal narrowing. T5-6: 5 x 9 x 11 mm RIGHT central disc extrusion, calcified on prior CT results and mild canal stenosis and ventral cord deformity. No neural foraminal narrowing. T6-7: Small central disc protrusion without canal stenosis or neural foraminal narrowing. T10-11: Tiny LEFT central disc protrusion. No disc bulge, canal stenosis nor neural foraminal narrowing at any remaining level. IMPRESSION: MRI CERVICAL SPINE: No findings of demyelination, contrast enhanced sequences are more sensitive for acute demyelination. 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 Moderate C4-5 through C6-7 neural foraminal narrowing. MRI THORACIC SPINE: No findings of demyelination, contrast enhanced sequences are more sensitive for acute demyelination. Re- demonstration of 5 x 9 x 11 mm T5-6 calcified extrusion resulting in mild canal stenosis. Electronically Signed   By: Elon Alas M.D.   On: 07/08/2016 20:59    ECG & Cardiac Imaging    EKG: AFib with nonspeific  IVCD  Echo: 07/10/16  Study Conclusions  - Left ventricle: inferior wall hypokinesis. Systolic function was   mildly reduced. The estimated ejection fraction was in the range   of 45% to 50%. - Aortic valve: There was mild regurgitation. Valve area (VTI):   1.53 cm^2. Valve area (Vmax): 1.58 cm^2. Valve area (Vmean): 1.57   cm^2. - Mitral valve: There was mild regurgitation. - Left atrium: The atrium was mildly dilated. - Right atrium: The atrium was mildly dilated. - Atrial septum: No defect or patent foramen ovale was identified. - Tricuspid valve: There was moderate regurgitation. - Pericardium, extracardiac: A trivial pericardial effusion was   identified posterior to the heart.  Assessment & Plan    76 yo female with PMH of CAD, diastolic HF, abd aortic aneurysm s/p repair, CKD, pulmonary fibrosis, permanent AF, HTN, COPD and GERD who presented with chest pain, and HF.   1. Acute systolic HF: Last echo in 10/17 showed normal EF with septal WMA. BNP 60, CXR negative on admission. Given IVFs for dehydration, now with signs of volume overload on exam. Echo 4/28 showed reduced EF 45% with  inferior WMA. States dry weight on her home scales was 196lbs (MD office scales 203lbs) Weight is up this admission. Reporting orthopnea still.  -- currently on oral lasix, but little UOP noted. +5L. Will give a dose of IV lasix today, and follow output.   2. Chest pain: Had one episode this admission, with intermittent chest pressure at rest. Echo with reduced EF and new WMA in inferior wall. States she had a cath about 6 years ago, but I'm unable to find this report. Trop with flat mild elevation, in the setting of AKI.  -- on ASA, statin -- would treat medically at this time, as she is unable to lay flat. Consider further ischemic work up once further diuresed with cath vs stress.   3. Permanent AFib: Followed by Dr. Lovena Le. Ventricular rate controlled. Given drop in EF would transition to  metoprolol 12.5mg  BID -- TSH 6, synthroid adjusted per primary -- on coumadin, last INR 1.67 with lovenox bridge  4. CKD with AKI: Cr 2.1 pm admission. Now improved with 1.18. Follow BMET  5. Acute Encephalopathy: MRI non-acute. Work up has been negative thus far. Management per primary  Signed, Reino Bellis, NP-C Pager [phone] 07/12/2016, 9:33 AM   I have personally seen and examined this patient with Reino Bellis, NP. I agree with the assessment and plan as outlined above.  Briefly, 76 yo female with history of CAD by cath in 5364, chronic diastolic CHF, permanent AF admitted with confusion, volume overload. She has had chest pain over her left breast. Minimal troponin elevation.  My exam shows an obese female sitting up in the chair, no acute distress. She is oriented x 3. CV:RRR with systolic murmur. Mild AS, mild AI. Mild MR. Pulm: clear bilaterally. Ext: 1+ bilateral LE edema. Abd: soft, NT, ND EKG reviewed by me shows LBBB, atrial fibrillation. Echo records reviewed by me shows LVEF=45% with inferior wall motion abnormality.  Labs reviewed.  1. Acute systolic CHF: She appears to be volume overloaded with now normal renal function. I would plan to diurese with IV Lasix today Given new cardiomyopathy, I think she will need a cardiac cath, especially in the setting of chest pain, mild troponin elevation and known CAD. Will plan cath later this week after diuresis.  2. Atrial fib, permanent: Will change Cardizem to metoprolol given LV systolic dysfunction. Hold coumadin while planning for cath.   We will follow along this week and plan cath later this week.   Lauree Chandler 07/12/2016 11:01 AM

## 2016-07-13 ENCOUNTER — Ambulatory Visit: Payer: Self-pay | Admitting: Internal Medicine

## 2016-07-13 DIAGNOSIS — I481 Persistent atrial fibrillation: Secondary | ICD-10-CM

## 2016-07-13 LAB — PROTIME-INR
INR: 1.63
Prothrombin Time: 19.5 seconds — ABNORMAL HIGH (ref 11.4–15.2)

## 2016-07-13 LAB — BASIC METABOLIC PANEL
ANION GAP: 8 (ref 5–15)
BUN: 15 mg/dL (ref 6–20)
CHLORIDE: 99 mmol/L — AB (ref 101–111)
CO2: 29 mmol/L (ref 22–32)
Calcium: 8.7 mg/dL — ABNORMAL LOW (ref 8.9–10.3)
Creatinine, Ser: 1.36 mg/dL — ABNORMAL HIGH (ref 0.44–1.00)
GFR calc non Af Amer: 37 mL/min — ABNORMAL LOW (ref >60)
GFR, EST AFRICAN AMERICAN: 43 mL/min — AB (ref >60)
GLUCOSE: 91 mg/dL (ref 65–99)
Potassium: 3.7 mmol/L (ref 3.5–5.1)
Sodium: 136 mmol/L (ref 135–145)

## 2016-07-13 LAB — CBC
HEMATOCRIT: 38 % (ref 36.0–46.0)
HEMOGLOBIN: 12.9 g/dL (ref 12.0–15.0)
MCH: 31.6 pg (ref 26.0–34.0)
MCHC: 33.9 g/dL (ref 30.0–36.0)
MCV: 93.1 fL (ref 78.0–100.0)
Platelets: 161 10*3/uL (ref 150–400)
RBC: 4.08 MIL/uL (ref 3.87–5.11)
RDW: 16.5 % — ABNORMAL HIGH (ref 11.5–15.5)
WBC: 10.1 10*3/uL (ref 4.0–10.5)

## 2016-07-13 MED ORDER — GABAPENTIN 300 MG PO CAPS
300.0000 mg | ORAL_CAPSULE | Freq: Three times a day (TID) | ORAL | Status: DC
  Administered 2016-07-13 – 2016-07-16 (×9): 300 mg via ORAL
  Filled 2016-07-13 (×9): qty 1

## 2016-07-13 NOTE — Progress Notes (Addendum)
Progress Note  Patient Name: Katie [name] Date of Encounter: 07/13/2016  Primary Cardiologist: Dr. Lovena Le  Subjective   No chest pain. Breathing stable.   Inpatient Medications    Scheduled Meds: . allopurinol  150 mg Oral Daily  . aspirin EC  81 mg Oral Daily  . budesonide (PULMICORT) nebulizer solution  0.25 mg Nebulization BID  . digoxin  0.0625 mg Oral Daily  . enoxaparin (LOVENOX) injection  1 mg/kg Subcutaneous Q12H  . furosemide  20 mg Oral BID  . gabapentin  200 mg Oral TID  . levothyroxine  50 mcg Oral QAC breakfast  . metoprolol tartrate  12.5 mg Oral BID  . montelukast  10 mg Oral QHS  . pravastatin  40 mg Oral q1800  . umeclidinium-vilanterol  1 puff Inhalation Daily   Continuous Infusions:  PRN Meds: acetaminophen **OR** acetaminophen, ALPRAZolam, alum & mag hydroxide-simeth, antiseptic oral rinse, HYDROcodone-acetaminophen, hydroxypropyl methylcellulose / hypromellose, ipratropium-albuterol, ondansetron **OR** ondansetron (ZOFRAN) IV, polyethylene glycol   Vital Signs    Vitals:   07/12/16 1220 07/12/16 2013 07/12/16 2142 07/13/16 0413  BP: 106/77  133/74 109/63  Pulse: 84  95 73  Resp: 20  18 17   Temp: 97.5 F (36.4 C)  97.7 F (36.5 C) 98 F (36.7 C)  TempSrc: Oral  Oral Oral  SpO2: 97% 98% 96% 95%  Weight:    206 lb 12.8 oz (93.8 kg)  Height:        Intake/Output Summary (Last 24 hours) at 07/13/16 0859 Last data filed at 07/13/16 0349  Gross per 24 hour  Intake              480 ml  Output             1100 ml  Net             -620 ml   Filed Weights   07/11/16 0400 07/12/16 0355 07/13/16 0413  Weight: 209 lb 9.6 oz (95.1 kg) 209 lb 14.4 oz (95.2 kg) 206 lb 12.8 oz (93.8 kg)    Telemetry    afib at rate of 60s intermittently goes into 40s - Personally Reviewed  ECG    None today   Physical Exam   GEN: No acute distress.   Neck: No JVD Cardiac: RRR, no murmurs, rubs, or gallops. Trace to 1 + BL LE edema.  Respiratory: Clear  to auscultation bilaterally. GI: Soft, nontender, distended  MS: No edema; No deformity. Neuro:  Nonfocal  Psych: Normal affect   Labs    Chemistry Recent Labs Lab 07/10/16 0228 07/11/16 0402 07/12/16 0526  NA 135 135 134*  K 3.7 4.5 4.1  CL 99* 99* 101  CO2 30 27 23   GLUCOSE 108* 92 84  BUN 21* 11 11  CREATININE 1.31* 1.18* 1.18*  CALCIUM 8.6* 8.8* 8.4*  PROT 5.1* 5.7*  --   ALBUMIN 2.9* 3.2* 2.8*  AST 22 25  --   ALT 19 22  --   ALKPHOS 31* 33*  --   BILITOT 0.7 1.1  --   GFRNONAA 39* 44* 44*  GFRAA 45* 51* 51*  ANIONGAP 6 9 10      Hematology Recent Labs Lab 07/11/16 0703 07/12/16 0526 07/13/16 0617  WBC 9.7 8.6 10.1  RBC 4.12 4.05 4.08  HGB 12.5 12.5 12.9  HCT 37.8 37.4 38.0  MCV 91.7 92.3 93.1  MCH 30.3 30.9 31.6  MCHC 33.1 33.4 33.9  RDW 15.9* 16.4* 16.5*  PLT 173  165 161    Cardiac Enzymes Recent Labs Lab 07/07/16 2249 07/08/16 0559 07/11/16 0402  TROPONINI 0.08* 0.09* 0.07*    Recent Labs Lab 07/07/16 1519  TROPIPOC 0.10*     BNP Recent Labs Lab 07/07/16 1512  BNP 60.2     DDimer No results for input(s): DDIMER in the last 168 hours.   Radiology    No results found.  Cardiac Studies   Echo 07/10/16 Study Conclusions  - Left ventricle: inferior wall hypokinesis. Systolic function was   mildly reduced. The estimated ejection fraction was in the range   of 45% to 50%. - Aortic valve: There was mild regurgitation. Valve area (VTI):   1.53 cm^2. Valve area (Vmax): 1.58 cm^2. Valve area (Vmean): 1.57   cm^2. - Mitral valve: There was mild regurgitation. - Left atrium: The atrium was mildly dilated. - Right atrium: The atrium was mildly dilated. - Atrial septum: No defect or patent foramen ovale was identified. - Tricuspid valve: There was moderate regurgitation. - Pericardium, extracardiac: A trivial pericardial effusion was   identified posterior to the heart.  Patient Profile     76 y.o. female with PMH of CAD,  diastolic HF, abd aortic aneurysm s/p repair, CKD, pulmonary fibrosis, permanent AF, HTN, COPD and GERD who presented with chest pain, and HF.   Assessment & Plan    1. Acute systolic CHF - BNP of 60 and CXR negative on admission. Given IVFs for dehydration, now with signs of volume overload.  - Echo this admission showed reduced EF to 45% with inferior WMA. Given IV lasix 20mg  yesterday. Now on lasix 20mg  BID. Net I & O +5.2L with negative 620cc yesterday. Weight down 3lb since yesterday.  - her abdomen is distended and has LE edema. Not much output. Consider high dose of IV lasix. Scr stable today. Will review with MD.   2. Chest pain with minimally elevated flat troponin trend.  - see below  3. Permanent atrial fibrillation - Cardizem changed to low dose metoprolol given LV systolic dysfunction. Coumadin on hold. INR of 1.63 today. MD to decide heparin.   4. HTN - BP stable.    Signed, Leanor Kail, PA  07/13/2016, 8:59 AM    I have personally seen and examined this patient with Leanor Kail, PA-C.  I agree with the assessment as above. She is still volume overloaded with abdominal distention and LE edema. She is diuresing well with 3 lb weight loss. I have looked back and found that she has had LV systolic dysfunction noted back to 2005 with inferior wall HK and I have reviewed this with her cardiologist. The best plan at this time may be to continue diuresis with IV Lasix today and hold off on cath if possible. She believes that she would unable to lay flat on the cath table due to orthopnea and vertigo. She has no chest pain this am. The troponin bump was subtle with flat trend. I suspect that she was overhydrated leading to volume overload and that her symptoms are related to this. I am hopeful that she will improve with diuresis over the next 48 hours. There are already signs of improvement today with 18 hours of diuresis.   I will change to Lasix 40 IV BID today. Reassess  in am. If she is improved tomorrow in regards to dyspnea with no recurrent chest pain, will not plan cardiac cath.    Lauree Chandler 07/13/2016 11:04 AM

## 2016-07-13 NOTE — Progress Notes (Signed)
Physical Therapy Treatment Patient Details Name: Katie [name] MRN: [id number] DOB: 1940-12-20 Today's Date: 07/13/2016    History of Present Illness Patient is a 76 yo female admitted 07/07/16 with chest pain, headache, confusion.  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      PMH:  CAD, CHF, aortic aneurysm, obesity, CKD, pulmonary fibrosis, Afib, COPD, HTN    PT Comments    Pt is getting up to walk with PT and noted her need for close guard initially but then just sat to rest to use commode seat.  Pt is more open to idea of SNF now that she is not having a fast return to PLOF.  Her plan is to get up to walk and strengthen legs with PT acutely and will progress to SNF with short stay anticipated due to her motivation to get home.  Continue to work and monitor her vitals and tolerance for activity, but will see if steps are a possibility in the coming 1-2 days.     Follow Up Recommendations  SNF     Equipment Recommendations  Rolling walker with 5" wheels (for home if no walker is there)    Recommendations for Other Services       Precautions / Restrictions Precautions Precautions: Fall Precaution Comments: RLE strength and pain are limiting Restrictions Weight Bearing Restrictions: No    Mobility  Bed Mobility               General bed mobility comments: up when PT arrived  Transfers Overall transfer level: Needs assistance Equipment used: Rolling walker (2 wheeled) Transfers: Sit to/from [facility] Sit to Stand: Min assist Stand pivot transfers: Min assist       General transfer comment: cued hand placement every trial  Ambulation/Gait Ambulation/Gait assistance: Min assist;Min guard Ambulation Distance (Feet): 25 Feet (x 2) Assistive device: Rolling walker (2 wheeled);1 person hand held assist Gait Pattern/deviations: Step-through pattern;Decreased stride length;Wide  base of support;Trunk flexed;Shuffle Gait velocity: decreased Gait velocity interpretation: Below normal speed for age/gender General Gait Details: needed actual direction help on walker as she  needed to turn, and requires recues for progression of gait   Stairs Stairs:  (unable to tolerate yet)          Wheelchair Mobility    Modified Rankin (Stroke Patients Only)       Balance     Sitting balance-Leahy Scale: Good Sitting balance - Comments: performed LB bathing and dressing without LOB     Standing balance-Leahy Scale: Poor Standing balance comment: able to release one hand on walker for pericare                            Cognition Arousal/Alertness: Awake/alert Behavior During Therapy: Flat affect Overall Cognitive Status: Impaired/Different from baseline Area of Impairment: Following commands;Safety/judgement;Awareness;Problem solving                       Following Commands: Follows one step commands with increased time Safety/Judgement: Decreased awareness of safety;Decreased awareness of deficits Awareness: Intellectual Problem Solving: Slow processing;Requires verbal cues;Requires tactile cues        Exercises      General Comments General comments (skin integrity, edema, etc.): Pt is up in chair with no O2 and SOB with any effort      Pertinent Vitals/Pain Pain Assessment: Faces Faces Pain Scale: Hurts little more  Pain Location: sciatic and back pain Pain Descriptors / Indicators: Sore;Grimacing Pain Intervention(s): Limited activity within patient's tolerance;Monitored during session;Repositioned;Premedicated before session    Home Living Family/patient expects to be discharged to:: Private residence                    Prior Function            PT Goals (current goals can now be found in the care plan section) Acute Rehab PT Goals Patient Stated Goal: to get stronger and feel better, less SOB Progress  towards PT goals: Progressing toward goals    Frequency    Min 3X/week      PT Plan Discharge plan needs to be updated    Co-evaluation              AM-PAC PT "6 Clicks" Daily Activity  Outcome Measure  Difficulty turning over in bed (including adjusting bedclothes, sheets and blankets)?: Total Difficulty moving from lying on back to sitting on the side of the bed? : Total Difficulty sitting down on and standing up from a chair with arms (e.g., wheelchair, bedside commode, etc,.)?: Total Help needed moving to and from a bed to chair (including a wheelchair)?: A Little Help needed walking in hospital room?: A Little Help needed climbing 3-5 steps with a railing? : A Lot 6 Click Score: 11    End of Session Equipment Utilized During Treatment: Gait belt Activity Tolerance: Patient tolerated treatment well Patient left: in chair;with call bell/phone within reach;with chair alarm set Nurse Communication: Mobility status PT Visit Diagnosis: Unsteadiness on feet (R26.81);Other abnormalities of gait and mobility (R26.89);Muscle weakness (generalized) (M62.81)     Time: 2902-1115 PT Time Calculation (min) (ACUTE ONLY): 28 min  Charges:  $Gait Training: 8-22 mins $Therapeutic Activity: 8-22 mins                    G Codes:  Functional Assessment Tool Used: AM-PAC 6 Clicks Basic Mobility     Ramond Dial 07/13/2016, 12:55 PM   Mee Hives, PT MS Acute Rehab Dept. Number: [place] and [place]

## 2016-07-13 NOTE — Clinical Social Work Note (Signed)
Clinical Social Work Assessment  Patient Details  Name: Katie [name] MRN: [id number] Date of Birth: November 04, 1940  Date of referral:  07/13/16               Reason for consult:  Facility Placement, Discharge Planning                Permission sought to share information with:  Facility Art therapist granted to share information::  Yes, Verbal Permission Granted  Name::     Katie [name]  Agency::  SNF's  Relationship::  Daughter  Contact Information:  [phone]  Housing/Transportation Living arrangements for the past 2 months:  Single Family Home Source of Information:  Patient, Medical Team, Other (Comment Required) (Granddaughter) Patient Interpreter Needed:  None Criminal Activity/Legal Involvement Pertinent to Current Situation/Hospitalization:  No - Comment as needed Significant Relationships:  Adult Children, Other Family Members, Spouse Lives with:  Spouse Do you feel safe going back to the place where you live?  Yes Need for family participation in patient care:  Yes (Comment)  Care giving concerns:  PT has changed their recommendation from [place] to SNF.   Social Worker assessment / plan:  CSW met with patient. Granddaughter at bedside. CSW introduced role and explained that PT recommendations would be discussed. Patient does not want to go to SNF but understands that this is what is needed prior to returning home. She states that her husband cannot care for her as she is right now. First preference SNF is [facility] Admissions coordinator notified and they can take her when she is ready. No further concerns. CSW encouraged patient to contact CSW as needed. CSW will continue to follow patient for support and facilitate discharge to SNF once medically stable.  Employment status:  Retired Nurse, adult PT Recommendations:  [place] / Referral to community resources:  [place]  Patient/Family's Response to care:  Patient agreeable to SNF placement. Patient's family supportive and involved in patient's care. Patient appreciated social work intervention.  Patient/Family's Understanding of and Emotional Response to Diagnosis, Current Treatment, and Prognosis:  Patient has a good understanding of the reason for admission and her need for rehab prior to returning home. Patient appears pleased with hospital care.  Emotional Assessment Appearance:  Appears stated age Attitude/Demeanor/Rapport:  Other ([place]) Affect (typically observed):  Accepting, Appropriate, Calm, Pleasant Orientation:  Oriented to Self, Oriented to Place, Oriented to  Time, Oriented to Situation Alcohol / Substance use:  Never Used Psych involvement (Current and /or in the community):  No (Comment)  Discharge Needs  Concerns to be addressed:  Care Coordination Readmission within the last 30 days:  No Current discharge risk:  Dependent with Mobility Barriers to Discharge:  Continued Medical Work up   Katie Chroman, LCSW 07/13/2016, 3:21 PM

## 2016-07-13 NOTE — NC FL2 (Signed)
[place] LEVEL OF CARE SCREENING TOOL     IDENTIFICATION  Patient Name: Katie [name] Birthdate: 11-28-1940 Sex: female Admission Date (Current Location): 07/07/2016  [facility] and [place] Number:  Herbalist and Address:  [facility] [place], [place] [address] [place], [place] [address]      Provider Number: [id number]  Attending Physician Name and Address:  Cherene Altes, MD  Relative Name and Phone Number:       Current Level of Care: Hospital Recommended Level of Care: [place] Prior Approval Number:    Date Approved/Denied:   PASRR Number: [id number]  Discharge Plan: SNF    Current Diagnoses: Patient Active Problem List   Diagnosis Date Noted  . Acute systolic CHF (congestive heart failure) ([place])   . Acute encephalopathy 07/07/2016  . Chest pain 07/07/2016  . Hyponatremia 07/07/2016  . Acute kidney injury superimposed on chronic kidney disease ([place]) 07/07/2016  . Acute respiratory failure with hypoxia ([place]) 07/07/2016  . Hypotension 02/03/2016  . Acute diastolic congestive heart failure ([place]) 12/25/2015  . Other abnormal glucose 09/19/2015  . AAA (abdominal aortic aneurysm) without rupture ([place]) 04/22/2015  . Morbid obesity due to excess calories ([place]) 08/03/2014  . PVD (peripheral vascular disease) with claudication ([place]) 10/16/2013  . CKD  stage III (GFR 51 ml/min) 06/08/2013  . Hyperlipidemia 06/08/2013  . Medication management 06/08/2013  . 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 Long-term (current) use of anticoagulants 04/23/2013  . Vitamin D deficiency 02/15/2013  . Hypothyroidism   . Osteopenia   . Congestive heart failure ([place]) 11/25/2008  . Migraine headache 11/22/2008  . Essential hypertension 11/22/2008  . Coronary atherosclerosis 11/22/2008  . Atrial fibrillation ([place]) 11/22/2008  . COPD (chronic obstructive pulmonary disease) with chronic bronchitis ([place]) 11/22/2008   . GERD 11/22/2008    Orientation RESPIRATION BLADDER Height & Weight     Self, Time, Situation, Place  Normal Continent Weight: 206 lb 12.8 oz (93.8 kg) Height:  5\' 3"  (160 cm)  BEHAVIORAL SYMPTOMS/MOOD NEUROLOGICAL BOWEL NUTRITION STATUS   (None)  (None) Continent Diet (Heart healthy)  AMBULATORY STATUS COMMUNICATION OF NEEDS Skin   Limited Assist Verbally Normal                       Personal Care Assistance Level of Assistance  Bathing, Feeding, Dressing Bathing Assistance: Limited assistance Feeding assistance: Limited assistance Dressing Assistance: Limited assistance     Functional Limitations Info  Sight, Hearing, Speech Sight Info: Adequate Hearing Info: Adequate Speech Info: Adequate    SPECIAL CARE FACTORS FREQUENCY  PT (By licensed PT), Blood pressure, OT (By licensed OT)     PT Frequency: 5 x week OT Frequency: 5 x week            Contractures Contractures Info: Not present    Additional Factors Info  Code Status, Allergies Code Status Info: Full Allergies Info: Amiodarone, Diovan (Valsartan), Doxycycline, Flexeril (Cyclobenzaprine), Keflex (Cephalexin), Verapamil, Advair Diskus (Fluticasone-salmeterol), Codeine           Current Medications (07/13/2016):  This is the current hospital active medication list Current Facility-Administered Medications  Medication Dose Route Frequency Provider Last Rate Last Dose  . acetaminophen (TYLENOL) tablet 650 mg  650 mg Oral Q6H PRN Brenton Grills, PA-C   650 mg at 07/13/16 7939   Or  . acetaminophen (TYLENOL) suppository 650 mg  650 mg Rectal Q6H PRN Brenton Grills, PA-C      .  allopurinol (ZYLOPRIM) tablet 150 mg  150 mg Oral Daily Brenton Grills, PA-C   150 mg at 07/13/16 1056  . ALPRAZolam Duanne Moron) tablet 0.5 mg  0.5 mg Oral TID PRN Cherene Altes, MD   0.5 mg at 07/12/16 2305  . alum & mag hydroxide-simeth (MAALOX/MYLANTA) 200-200-20 MG/5ML suspension 15 mL  15 mL Oral Q4H PRN Thurnell Lose, MD   15 mL at 07/12/16 2152  . antiseptic oral rinse (BIOTENE) solution 15 mL  15 mL Mouth Rinse PRN Cherene Altes, MD      . aspirin EC tablet 81 mg  81 mg Oral Daily Brenton Grills, PA-C   81 mg at 07/13/16 1055  . budesonide (PULMICORT) nebulizer solution 0.25 mg  0.25 mg Nebulization BID Waldemar Dickens, MD   0.25 mg at 07/12/16 2013  . digoxin (LANOXIN) tablet 0.0625 mg  0.0625 mg Oral Daily Cherene Altes, MD   0.0625 mg at 07/13/16 1057  . enoxaparin (LOVENOX) injection 95 mg  1 mg/kg Subcutaneous Q12H Romona Curls, RPH   95 mg at 07/13/16 1229  . furosemide (LASIX) tablet 20 mg  20 mg Oral BID Cheryln Manly, NP   20 mg at 07/13/16 1058  . gabapentin (NEURONTIN) capsule 300 mg  300 mg Oral TID Cherene Altes, MD      . HYDROcodone-acetaminophen (NORCO/VICODIN) 5-325 MG per tablet 1-2 tablet  1-2 tablet Oral Q6H PRN Cherene Altes, MD   1 tablet at 07/12/16 2304  . hydroxypropyl methylcellulose / hypromellose (ISOPTO TEARS / GONIOVISC) 2.5 % ophthalmic solution 1 drop  1 drop Both Eyes QID PRN Brenton Grills, PA-C      . ipratropium-albuterol (DUONEB) 0.5-2.5 (3) MG/3ML nebulizer solution 3 mL  3 mL Nebulization Q4H PRN Courage Emokpae, MD      . levothyroxine (SYNTHROID, LEVOTHROID) tablet 50 mcg  50 mcg Oral QAC breakfast Brenton Grills, PA-C   50 mcg at 07/13/16 1055  . metoprolol tartrate (LOPRESSOR) tablet 12.5 mg  12.5 mg Oral BID Cheryln Manly, NP   12.5 mg at 07/13/16 1056  . montelukast (SINGULAIR) tablet 10 mg  10 mg Oral QHS Brenton Grills, PA-C   10 mg at 07/12/16 2144  . ondansetron (ZOFRAN) tablet 4 mg  4 mg Oral Q6H PRN Brenton Grills, PA-C       Or  . ondansetron [facility]) injection 4 mg  4 mg Intravenous Q6H PRN Brenton Grills, PA-C      . polyethylene glycol (MIRALAX / GLYCOLAX) packet 17 g  17 g Oral Daily PRN Brenton Grills, PA-C   17 g at 07/08/16 1656  . pravastatin (PRAVACHOL) tablet 40 mg  40 mg Oral q1800 Brenton Grills, PA-C   40 mg at 07/12/16 1804  . umeclidinium-vilanterol (ANORO ELLIPTA) 62.5-25 MCG/INH 1 puff  1 puff Inhalation Daily Waldemar Dickens, MD   1 puff at 07/12/16 1610     Discharge Medications: Please see discharge summary for a list of discharge medications.  Relevant Imaging Results:  Relevant Lab Results:   Additional Information SS#: [id number]  Candie Chroman, LCSW

## 2016-07-13 NOTE — Clinical Social Work Placement (Signed)
   CLINICAL SOCIAL WORK PLACEMENT  NOTE  Date:  07/13/2016  Patient Details  Name: Katie [name] MRN: [id number] Date of Birth: Aug 17, 1940  Clinical Social Work is seeking post-discharge placement for this patient at the [place] level of care (*CSW will initial, date and re-position this form in  chart as items are completed):  Yes   Patient/family provided with [place] Work Department's list of facilities offering this level of care within the geographic area requested by the patient (or if unable, by the patient's family).  Yes   Patient/family informed of their freedom to choose among providers that offer the needed level of care, that participate in Medicare, Medicaid or managed care program needed by the patient, have an available bed and are willing to accept the patient.  Yes   Patient/family informed of [facility]'s ownership interest in [facility] and [facility], as well as of the fact that they are under no obligation to receive care at these facilities.  PASRR submitted to EDS on 07/13/16     PASRR number received on 07/13/16     Existing PASRR number confirmed on       FL2 transmitted to all facilities in geographic area requested by pt/family on 07/13/16     FL2 transmitted to all facilities within larger geographic area on       Patient informed that his/her managed care company has contracts with or will negotiate with certain facilities, including the following:            Patient/family informed of bed offers received.  Patient chooses bed at       Physician recommends and patient chooses bed at      Patient to be transferred to   on  .  Patient to be transferred to facility by       Patient family notified on   of transfer.  Name of family member notified:        PHYSICIAN Please sign FL2     Additional Comment:    _______________________________________________ Candie Chroman, LCSW 07/13/2016, 3:24  PM

## 2016-07-13 NOTE — Progress Notes (Signed)
Occupational Therapy Treatment Patient Details Name: Katie [name] MRN: [id number] DOB: Oct 11, 1940 Today's Date: 07/13/2016    History of present illness Patient is a 76 yo female admitted 07/07/16 with chest pain, headache, confusion.  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      PMH:  CAD, CHF, aortic aneurysm, obesity, CKD, pulmonary fibrosis, Afib, COPD, HTN   OT comments  Pt demonstrated ability to manage washing and dressing feet in sitting crossing her foot over opposite knee. Pt educated in use of AE, but did not make task easier due to UE weakness. Pt with decreased activity tolerance. Continue to recommend ST rehab in SNF, although pt wants "rehab at home." Husband's expectation appears to be for pt to return to her baseline upon discharge, does not seem to appreciate that pt will require assistance or the gravity of pt's multiple diagnoses. Will continue to follow.  Follow Up Recommendations  SNF;Supervision/Assistance - 24 hour (pt likely to refuse)    Equipment Recommendations       Recommendations for Other Services      Precautions / Restrictions Precautions Precautions: Fall Restrictions Weight Bearing Restrictions: No       Mobility Bed Mobility                  Transfers     Transfers: Sit to/from Stand Sit to Stand: Min assist              Balance     Sitting balance-Leahy Scale: Good Sitting balance - Comments: performed LB bathing and dressing without LOB     Standing balance-Leahy Scale: Poor Standing balance comment: able to release one hand on walker for pericare                           ADL either performed or assessed with clinical judgement   ADL Overall ADL's : Needs assistance/impaired     Grooming: Wash/dry hands;Sitting;Set up       Lower Body Bathing: Set up;Sitting/lateral leans Lower Body Bathing Details (indicate cue type and  reason): pt crossed her foot over opposite knee to wash feet Upper Body Dressing : Set up;Sitting Upper Body Dressing Details (indicate cue type and reason): front opening gown Lower Body Dressing: Minimal assistance;Sitting/lateral leans Lower Body Dressing Details (indicate cue type and reason): pt donned L sock, used sock aide for R, but ineffectively               General ADL Comments: educated pt and husband in use of reacher and sock aide, sock aide ineffective for pt, performed better crossing foot over opposite knee, pt's bathroom will not accommodate tub equipment due to its size.      Vision       Perception     Praxis      Cognition Arousal/Alertness: Awake/alert Behavior During Therapy: Flat affect Overall Cognitive Status: Impaired/Different from baseline Area of Impairment: Safety/judgement;Problem solving                         Safety/Judgement: Decreased awareness of deficits   Problem Solving: Slow processing;Decreased initiation;Difficulty sequencing;Requires verbal cues;Requires tactile cues          Exercises     Shoulder Instructions       General Comments      Pertinent Vitals/ Pain       Pain Assessment: Faces Faces  Pain Scale: Hurts little more Pain Location: R LE Pain Descriptors / Indicators: Aching;Sore;Spasm Pain Intervention(s): Monitored during session;Repositioned  Home Living Family/patient expects to be discharged to:: Private residence                                        Prior Functioning/Environment              Frequency  Min 2X/week        Progress Toward Goals  OT Goals(current goals can now be found in the care plan section)  Progress towards OT goals: Progressing toward goals  Acute Rehab OT Goals Patient Stated Goal: To return home OT Goal Formulation: With patient Time For Goal Achievement: 07/26/16 Potential to Achieve Goals: Good  Plan Discharge plan remains  appropriate    Co-evaluation                 AM-PAC PT "6 Clicks" Daily Activity     Outcome Measure   Help from another person eating meals?: None Help from another person taking care of personal grooming?: A Little Help from another person toileting, which includes using toliet, bedpan, or urinal?: A Little Help from another person bathing (including washing, rinsing, drying)?: A Lot Help from another person to put on and taking off regular upper body clothing?: None Help from another person to put on and taking off regular lower body clothing?: A Lot 6 Click Score: 18    End of Session Equipment Utilized During Treatment: Rolling walker;Gait belt  OT Visit Diagnosis: Unsteadiness on feet (R26.81);Muscle weakness (generalized) (M62.81);Pain;Other symptoms and signs involving cognitive function Pain - Right/Left: Right Pain - part of body: Leg   Activity Tolerance Patient limited by fatigue   Patient Left in chair;with call bell/phone within reach;with family/visitor present   Nurse Communication          Time: 1017-5102 OT Time Calculation (min): 32 min  Charges: OT General Charges $OT Visit: 1 Procedure OT Treatments $Self Care/Home Management : 23-37 mins   Malka So 07/13/2016, 11:53 AM [phone]

## 2016-07-13 NOTE — Progress Notes (Signed)
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  Subjective: The patient is sitting up in a chair and looks much better today.  She is alert and oriented.  She denies shortness of breath or chest pain.  She complains of ongoing right lower extremity burning electrical type pain with some associated weakness.  She states this is like her usual sciatica but worse.  She tells me she had imaging in her orthopedist office approximately one year ago for this issue.  She tells me she does not feel she could tolerate an MRI at this time.  Assessment & Plan:  Acute Encephalopathy / Gait Concerns Family reported intermittent episodes of confusion, unsteady gait, difficulty finding words - MRI brain w/o acute findings but w/ ?of cerebellar lesion (not likely related to AMS) - ammonia, B12, and folate levels normal - RPR and HIV negative - normal UA - suspect underlying dementia with superimposed delirium most likely due to hyponatremia and acute kidney injury/azotemia +/- hypothyroidism - cont PT/OT - follow mental status w/ tx of hypothyroidism, normalization of electrolytes, and volume resusciation - appears to be much improved in this regard - encouraged family to have pt seen by a Geriatric MD as an outpt once she has stabilized for a cognitive evaluation, but w/ acute correctable issues at play this would not be appropriate acutely   Chest pain in patient with known CAD TTE reveals inferior wall hypokinesis and systolic CHF which may be new - last TTE Oct 2017 noted preserved EF, but TTE Sept  2014 noted EF 50% - I can find no prior TTE noting inferior wall hypokinesis - I see no cath report on file - Cards to take pt to cath lab ?Wed  Recent Labs Lab 07/07/16 2249 07/08/16 0559 07/11/16 0402  TROPONINI 0.08* 0.09* 0.07*    Chronic diastolic CHF and systolic CHF of unclear chronicity  EF 60-65% via TTE 12/24/2015 without specific qualification of diastolic function - TTE this admit w/ EF 45-50% w/ inferior wall hypokinesis - following daily weights - pt rapidly transitioned from signif DH to mild volume overload - no ACEi/ARB for now due to recent acute kidney injury - resumed low dose diuretic 4/29 - no gross overload at present    [facility] Weights   07/11/16 0400 07/12/16 0355 07/13/16 0413  Weight: 95.1 kg (209 lb 9.6 oz) 95.2 kg (209 lb 14.4 oz) 93.8 kg (206 lb 12.8 oz)    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 not significantly changed from Oct 2017 - M is appreciable on exam   R cerebellar lesion of unclear etiology  Neurology consulted and feels "possibilities include demyelinating lesion versus CVA" - is felt to be old - suggests outpt Neuro f/o - has now signed off - consider f/u MRI as outpt in 6-8 weeks (discussed at length w/ pt and duaghter)  Hyponatremia Appeared to be hypovolemic in nature - Sodium normalized with gentle volume resuscitation - watch w/ resumption of diuresis   Recent Labs Lab 07/08/16 0559 07/09/16 0239 07/10/16 0228 07/11/16 0402 07/12/16 0526  NA 128* 133* 135 135 134*  Hypokalemia due to diuretic therapy and poor intake - supplemented to normal range  Chronic Atrial fibrillation Rate controlled - continue usual medical therapy - continue lovenox only while awaiting cath  AKI superimposed on CKD stage III baseline creatinine approximately 1.3 - creatinine has returned to her baseline with volume resuscitation  Recent Labs Lab 07/08/16 0559 07/09/16 0239 07/10/16 0228 07/11/16 0402 07/12/16 0526  CREATININE 1.98* 1.59*  1.31* 1.18* 1.18*    COPD continue home meds - compensated at this time  Hypothyroidism TSH 6 - reports being on same dose of synthroid "for years" - may simply be altered by acute illness - increased dose as her ROS suggests sx of hypothyroidism - discussed need to keep TSH at ~1.0   R LE pain/weakness Hx suggests this is an acute exacerbation of a chronic problem - description sounds c/w sciatica - discussed w/ pt that this could be due to disc herniation, compression fracture, foraminal stenosis, or other acute/subacute issues, but that she would not presently be a candidate for spine surgery outside of an emergency and that presently she does not have sx to suggest an emergency - I offered her an MRI to investigate, but she does not presently feel she could tolerate an MRI   DVT prophylaxis: lovenox  Code Status: FULL CODE Family Communication: spoke w/ daughter at bedside at significant length  Disposition Plan: awaiting cardiac cath   Consultants:  Neurology  [facility]  Procedures: TTE - noted above   Antimicrobials:  none   Objective: Blood pressure 109/63, pulse 73, temperature 98 F (36.7 C), temperature source Oral, resp. rate 17, height 5\' 3"  (1.6 m), weight 93.8 kg (206 lb 12.8 oz), SpO2 95 %.  Intake/Output Summary (Last 24 hours) at 07/13/16 0841 Last data filed at 07/13/16 0349  Gross per 24 hour  Intake              480 ml  Output             1100 ml  Net             -620 ml   Filed Weights   07/11/16 0400 07/12/16 0355 07/13/16 0413  Weight: 95.1 kg (209 lb 9.6 oz) 95.2 kg (209 lb 14.4 oz) 93.8 kg (206 lb 12.8 oz)    Examination: General: No acute respiratory distress - alert and oriented x4 Lungs: Clear to auscultation bilaterally - no wheezing or focal crackles  Cardiovascular: Regular rate with 2/6 systolic M  Abdomen: Nontender, nondistended, soft, bowel sounds positive, no rebound Extremities: stable 1+ B LE edema   CBC:  Recent  Labs Lab 07/08/16 0559 07/10/16 0228 07/11/16 0703 07/12/16 0526 07/13/16 0617  WBC 10.2 8.7 9.7 8.6 10.1  HGB 15.3* 12.8 12.5 12.5 12.9  HCT 45.1 39.3 37.8 37.4 38.0  MCV 89.8 92.3 91.7 92.3 93.1  PLT 216 176 173 165 878   Basic Metabolic Panel:  Recent Labs Lab 07/08/16 0559 07/09/16 0239 07/10/16 0228 07/11/16 0402 07/12/16 0526  NA 128* 133* 135 135 134*  K 2.7* 3.8 3.7 4.5 4.1  CL 83* 91* 99* 99* 101  CO2 33* 34* 30 27 23   GLUCOSE 107* 104* 108* 92 84  BUN 52* 36* 21* 11 11  CREATININE 1.98* 1.59* 1.31* 1.18* 1.18*  CALCIUM 9.2 8.9 8.6* 8.8* 8.4*  MG  --  2.1  --   --   --   PHOS  --   --   --   --  2.3*   GFR: Estimated Creatinine Clearance: 45.6 mL/min (A) (by C-G formula based on SCr of 1.18 mg/dL (H)).  Liver Function Tests:  Recent Labs Lab 07/10/16 0228 07/11/16 0402 07/12/16 0526  AST 22 25  --   ALT 19 22  --   ALKPHOS 31* 33*  --   BILITOT 0.7 1.1  --   PROT 5.1* 5.7*  --   ALBUMIN 2.9* 3.2* 2.8*    Recent Labs Lab 07/07/16 1941  AMMONIA 26    Coagulation Profile:  Recent Labs Lab 07/09/16 0239 07/10/16 0228 07/11/16 0402 07/12/16 0526 07/13/16 0617  INR 5.45* 1.91 1.50 1.67 1.63    Cardiac Enzymes:  Recent Labs Lab 07/07/16 2249 07/08/16 0559 07/11/16 0402  TROPONINI 0.08* 0.09* 0.07*    HbA1C: Hgb A1c MFr Bld  Date/Time Value Ref Range Status  06/16/2016 01:54 PM 5.7 (H) <5.7 % Final    Comment:      For someone without known diabetes, a hemoglobin A1c value between 5.7% and 6.4% is consistent with prediabetes and should be confirmed with a follow-up test.   For someone with known diabetes, a value <7% indicates that their diabetes is well controlled. A1c targets should be individualized based on duration of diabetes, age, co-morbid conditions and other considerations.   This assay result is consistent with an increased risk of diabetes.   Currently, no consensus exists regarding use of hemoglobin A1c  for diagnosis of diabetes in children.     03/01/2016 11:28 AM 5.2 <5.7 % Final    Comment:      For the purpose of screening for the presence of diabetes:   <5.7%       Consistent with the absence of diabetes 5.7-6.4 %   Consistent with increased risk for diabetes (prediabetes) >=6.5 %     Consistent with diabetes   This assay result is consistent with a decreased risk of diabetes.   Currently, no consensus exists regarding use of hemoglobin A1c for diagnosis of diabetes in children.   According to American Diabetes Association (ADA) guidelines, hemoglobin A1c <7.0% represents optimal control in non-pregnant diabetic patients. Different metrics may apply to specific patient populations. Standards of Medical Care in Diabetes (ADA).        Recent Results (from the past 240 hour(s))  MRSA PCR Screening     Status: None   Collection Time: 07/07/16 10:23 PM  Result Value Ref Range Status   MRSA by PCR NEGATIVE NEGATIVE Final    Comment:        The GeneXpert MRSA Assay (FDA approved for NASAL specimens only), is one component of a comprehensive MRSA colonization surveillance program. It is not intended to diagnose MRSA infection nor to guide or monitor treatment for MRSA infections.      Scheduled Meds: . allopurinol  150 mg Oral Daily  . aspirin EC  81 mg Oral Daily  . budesonide (PULMICORT) nebulizer solution  0.25 mg Nebulization BID  . digoxin  0.0625 mg Oral Daily  . enoxaparin (LOVENOX) injection  1 mg/kg Subcutaneous Q12H  . furosemide  20 mg Oral BID  . gabapentin  200 mg Oral TID  . levothyroxine  50 mcg Oral QAC breakfast  . metoprolol tartrate  12.5 mg Oral BID  . montelukast  10 mg Oral QHS  . pravastatin  40 mg Oral q1800  . umeclidinium-vilanterol  1 puff Inhalation Daily     LOS: 6 days   Cherene Altes, MD [facility] Office  [phone] Pager - Text Page per Shea Evans as per below:  On-Call/Text Page:      [email or web address]       password [email or web address]  If 7PM-7AM, please contact night-coverage [email or web address] Password [email or web address] 07/13/2016, 8:41 AM

## 2016-07-14 ENCOUNTER — Encounter: Payer: Self-pay | Admitting: Internal Medicine

## 2016-07-14 ENCOUNTER — Inpatient Hospital Stay (HOSPITAL_COMMUNITY): Payer: Medicare Other

## 2016-07-14 ENCOUNTER — Ambulatory Visit (INDEPENDENT_AMBULATORY_CARE_PROVIDER_SITE_OTHER): Payer: Medicare Other | Admitting: Physician Assistant

## 2016-07-14 DIAGNOSIS — J9809 Other diseases of bronchus, not elsewhere classified: Secondary | ICD-10-CM

## 2016-07-14 DIAGNOSIS — J9601 Acute respiratory failure with hypoxia: Secondary | ICD-10-CM

## 2016-07-14 DIAGNOSIS — J810 Acute pulmonary edema: Secondary | ICD-10-CM

## 2016-07-14 DIAGNOSIS — R042 Hemoptysis: Secondary | ICD-10-CM

## 2016-07-14 LAB — BASIC METABOLIC PANEL
ANION GAP: 9 (ref 5–15)
BUN: 12 mg/dL (ref 6–20)
CALCIUM: 8.2 mg/dL — AB (ref 8.9–10.3)
CO2: 25 mmol/L (ref 22–32)
CREATININE: 1.19 mg/dL — AB (ref 0.44–1.00)
Chloride: 103 mmol/L (ref 101–111)
GFR, EST AFRICAN AMERICAN: 51 mL/min — AB (ref >60)
GFR, EST NON AFRICAN AMERICAN: 44 mL/min — AB (ref >60)
Glucose, Bld: 79 mg/dL (ref 65–99)
Potassium: 3.7 mmol/L (ref 3.5–5.1)
Sodium: 137 mmol/L (ref 135–145)

## 2016-07-14 LAB — CBC
HCT: 35.5 % — ABNORMAL LOW (ref 36.0–46.0)
Hemoglobin: 11.6 g/dL — ABNORMAL LOW (ref 12.0–15.0)
MCH: 30.1 pg (ref 26.0–34.0)
MCHC: 32.7 g/dL (ref 30.0–36.0)
MCV: 92 fL (ref 78.0–100.0)
PLATELETS: 194 10*3/uL (ref 150–400)
RBC: 3.86 MIL/uL — ABNORMAL LOW (ref 3.87–5.11)
RDW: 16 % — ABNORMAL HIGH (ref 11.5–15.5)
WBC: 7.4 10*3/uL (ref 4.0–10.5)

## 2016-07-14 LAB — PROTIME-INR
INR: 1.55
PROTHROMBIN TIME: 18.7 s — AB (ref 11.4–15.2)

## 2016-07-14 LAB — APTT: aPTT: 54 seconds — ABNORMAL HIGH (ref 24–36)

## 2016-07-14 MED ORDER — FUROSEMIDE 10 MG/ML IJ SOLN
40.0000 mg | Freq: Two times a day (BID) | INTRAMUSCULAR | Status: DC
  Administered 2016-07-14 – 2016-07-16 (×4): 40 mg via INTRAVENOUS
  Filled 2016-07-14 (×4): qty 4

## 2016-07-14 MED ORDER — FUROSEMIDE 10 MG/ML IJ SOLN
20.0000 mg | Freq: Once | INTRAMUSCULAR | Status: AC
  Administered 2016-07-14: 20 mg via INTRAVENOUS
  Filled 2016-07-14: qty 2

## 2016-07-14 MED ORDER — PANTOPRAZOLE SODIUM 40 MG PO TBEC
40.0000 mg | DELAYED_RELEASE_TABLET | Freq: Every day | ORAL | Status: DC
  Administered 2016-07-14 – 2016-07-16 (×3): 40 mg via ORAL
  Filled 2016-07-14 (×3): qty 1

## 2016-07-14 NOTE — Progress Notes (Signed)
Progress Note  Patient Name: Katie [name] Date of Encounter: 07/14/2016  Primary Cardiologist: Katie [name]  Subjective   Breathing is slightly better, but still with pitting [name] edema.   Inpatient Medications    Scheduled Meds: . allopurinol  150 mg Oral Daily  . aspirin EC  81 mg Oral Daily  . budesonide (PULMICORT) nebulizer solution  0.25 mg Nebulization BID  . digoxin  0.0625 mg Oral Daily  . enoxaparin (LOVENOX) injection  1 mg/kg Subcutaneous Q12H  . furosemide  20 mg Oral BID  . gabapentin  300 mg Oral TID  . levothyroxine  50 mcg Oral QAC breakfast  . metoprolol tartrate  12.5 mg Oral BID  . montelukast  10 mg Oral QHS  . pravastatin  40 mg Oral q1800  . umeclidinium-vilanterol  1 puff Inhalation Daily   Continuous Infusions:  PRN Meds: acetaminophen **OR** acetaminophen, ALPRAZolam, alum & mag hydroxide-simeth, antiseptic oral rinse, HYDROcodone-acetaminophen, hydroxypropyl methylcellulose / hypromellose, ipratropium-albuterol, ondansetron **OR** ondansetron (ZOFRAN) IV, polyethylene glycol   Vital Signs    Vitals:   07/13/16 2017 07/14/16 0616 07/14/16 0950 07/14/16 1003  BP:  119/65    Pulse: 83 80 78   Resp: 16 18    Temp:  97.5 F (36.4 C)    TempSrc:  Oral    SpO2: 96% 97%  (!) 89%  Weight:  204 lb 14.4 oz (92.9 kg)    Height:        Intake/Output Summary (Last 24 hours) at 07/14/16 1107 Last data filed at 07/14/16 1105  Gross per 24 hour  Intake              240 ml  Output             2000 ml  Net            -1760 ml   Filed Weights   07/12/16 0355 07/13/16 0413 07/14/16 0616  Weight: 209 lb 14.4 oz (95.2 kg) 206 lb 12.8 oz (93.8 kg) 204 lb 14.4 oz (92.9 kg)    Telemetry    AFib rate controlled - Personally Reviewed  ECG    N/A - Personally Reviewed  Physical Exam   General: Well developed, well nourished, female appearing in no acute distress. Head: Normocephalic, atraumatic.  Neck: Supple without bruits, JVD. Lungs:  Resp regular,  mildly labored, CTA. Heart: Irreg Irreg, S1, S2, no S3, S4, 3/6 systolic murmur; no rub. Abdomen: Soft, non-tender, non-distended with normoactive bowel sounds. No hepatomegaly. No rebound/guarding. No obvious abdominal masses. Extremities: No clubbing, cyanosis, 1+ pitting [name] R>L edema. Distal pedal pulses are 2+ bilaterally. Neuro: Alert and oriented X 3. Moves all extremities spontaneously. Psych: Normal affect.  Labs    Chemistry Recent Labs Lab 07/10/16 0228 07/11/16 0402 07/12/16 0526 07/13/16 1120 07/14/16 0510  NA 135 135 134* 136 137  K 3.7 4.5 4.1 3.7 3.7  CL 99* 99* 101 99* 103  CO2 30 27 23 29 25   GLUCOSE 108* 92 84 91 79  BUN 21* 11 11 15 12   CREATININE 1.31* 1.18* 1.18* 1.36* 1.19*  CALCIUM 8.6* 8.8* 8.4* 8.7* 8.2*  PROT 5.1* 5.7*  --   --   --   ALBUMIN 2.9* 3.2* 2.8*  --   --   AST 22 25  --   --   --   ALT 19 22  --   --   --   ALKPHOS 31* 33*  --   --   --  BILITOT 0.7 1.1  --   --   --   GFRNONAA 39* 44* 44* 37* 44*  GFRAA 45* 51* 51* 43* 51*  ANIONGAP 6 9 10 8 9      Hematology Recent Labs Lab 07/12/16 0526 07/13/16 0617 07/14/16 0510  WBC 8.6 10.1 7.4  RBC 4.05 4.08 3.86*  HGB 12.5 12.9 11.6*  HCT 37.4 38.0 35.5*  MCV 92.3 93.1 92.0  MCH 30.9 31.6 30.1  MCHC 33.4 33.9 32.7  RDW 16.4* 16.5* 16.0*  PLT 165 161 194    Cardiac Enzymes Recent Labs Lab 07/07/16 2249 07/08/16 0559 07/11/16 0402  TROPONINI 0.08* 0.09* 0.07*    Recent Labs Lab 07/07/16 1519  TROPIPOC 0.10*     BNP Recent Labs Lab 07/07/16 1512  BNP 60.2     DDimer No results for input(s): DDIMER in the last 168 hours.    Radiology    No results found.  Cardiac Studies   TTE: 07/10/16  Study Conclusions  - Left ventricle: inferior wall hypokinesis. Systolic function was   mildly reduced. The estimated ejection fraction was in the range   of 45% to 50%. - Aortic valve: There was mild regurgitation. Valve area (VTI):   1.53 cm^2. Valve area (Vmax):  1.58 cm^2. Valve area (Vmean): 1.57   cm^2. - Mitral valve: There was mild regurgitation. - Left atrium: The atrium was mildly dilated. - Right atrium: The atrium was mildly dilated. - Atrial septum: No defect or patent foramen ovale was identified. - Tricuspid valve: There was moderate regurgitation. - Pericardium, extracardiac: A trivial pericardial effusion was   identified posterior to the heart.   Patient Profile     76 y.o. female PMH of CAD, diastolic HF, abd aortic aneurysm s/p repair, CKD, pulmonary fibrosis, permanent AF, HTN, COPDand GERD who presented with chest pain, and HF  Assessment & Plan    1. Acute systolic CHF - BNP of 60 and CXR negative on admission. Given IVFs for dehydration, now with signs of volume overload.  - Echo this admission showed reduced EF to 45% with inferior WMA. Given IV lasix 20mg  x1. Now on lasix 20mg  BID. Net I & O +3.7L with negative 1.4cc yesterday. Weight down to 204lbs. - her abdomen is distended, still using [place] with O2 and has [name] edema. Will give additional dose of IV lasix this morning in addition to the 20mg  oral she received.  -- start 40mg  IV lasix BID.  -- follow BMET  2. Chest pain with minimally elevated flat troponin trend.  - no further episodes of chest pain. Suspect some of this may have been related to her volume overload.  - will hold on plans for cath for now.   3. Permanent atrial fibrillation - Cardizem changed to low dose metoprolol given LV systolic dysfunction. Coumadin on hold, on lovenox. INR of 1.55 today.   4. HTN - BP stable.   Signed, Katie Bellis, NP  07/14/2016, 11:07 AM    I have personally seen and examined this patient with Harlan Stains, NP. I agree with the assessment and plan as outlined above. She still feels weak. I do not think this is cardiac related. She is still volume overloaded. Will treat with IV Lasix today. Her LV function is likely not changed after review of old studies. No plans  for cath. Will not restart coumadin since she is having cough with dime sized mucus plugs that are dark, possibly old blood.    Katie [name] 07/14/2016 11:52  AM    

## 2016-07-14 NOTE — Progress Notes (Signed)
Patient says she's drowsy from medicine that she received earlier, VS stable. Patient resting.

## 2016-07-14 NOTE — Progress Notes (Signed)
Patient spit up 2 nickel sized blood clots. Patient said she has been having trouble swallowing. Was able to swallow PM medications without trouble.

## 2016-07-14 NOTE — Progress Notes (Signed)
Paged Reesa Chew, MD X2 for pt coughing up blood clot. MD will place new orders.

## 2016-07-14 NOTE — Consult Note (Signed)
[facility] Consultation Note  Referring Provider: Dr. Reesa Chew (Internal medicine service) Primary Care Physician:  [name], MD  Reason for Consultation:  Dysphagia, gi bleeding  HPI: Katie [name] is a 76 y.o. female admitted for altered mental status.  Patient on chronic warfarin for atrial fibrillation.  During this admission, she began having vague transfer-like dysphagia symptoms, and spitting up small gobs of mucous and blood.  No frank esophageal-type dysphagia symptoms.  No hematemesis, melena, hematochezia.  No abdominal pain, change in bowel habits, loss-of-appetite, unintentional weight loss, GERD.  No prior endoscopy.  Colonoscopy in 2004 by Dr. Cathlean Cower unrevealing per patient.   Past Medical History:  Diagnosis Date  . Atrial fibrillation ([place])   . Migraine headache   . Osteopenia     Past Surgical History:  Procedure Laterality Date  . ABDOMINAL AORTIC ENDOVASCULAR STENT GRAFT N/A 04/11/2013   Procedure: ABDOMINAL AORTIC ENDOVASCULAR STENT GRAFT WITH RIGHT FEMORAL PATCH ANGIOPLASTY;  Surgeon: Mal Misty, MD;  Location: [facility];  Service: Vascular;  Laterality: N/A;  . BREAST SURGERY     LEFT BREAST BIOPSY  . broken leg Left 1970s  . CARDIAC CATHETERIZATION    . CHOLECYSTECTOMY  1972  . COLONOSCOPY    . EYE SURGERY Bilateral   . Laser vein procedure    . REFRACTIVE SURGERY    . TONSILLECTOMY      Prior to Admission medications   Medication Sig Start Date End Date Taking? Authorizing Provider  acetaminophen (TYLENOL) 500 MG tablet Take 1,000 mg by mouth 2 (two) times daily as needed for moderate pain.   Yes Historical Provider, MD  albuterol (PROVENTIL HFA;VENTOLIN HFA) 108 (90 BASE) MCG/ACT inhaler 2 puffs 5 minutes apart every 4 to 6 hours as needed to rescue asthma Patient taking differently: Inhale 2 puffs into the lungs every 4 (four) hours as needed for shortness of breath. Inhale 2 puffs into the lungs 5 minutes apart every 4 to 6 hours  as needed to rescue asthma 08/13/13 07/07/16 Yes Melissa Smith, PA-C  allopurinol (ZYLOPRIM) 300 MG tablet TAKE 1 TABLET BY MOUTH EVERY DAY 05/25/16  Yes [name], MD  ALPRAZolam Duanne Moron) 0.5 MG tablet Take 1/2 to 1 tablet tid PRN for anxiety. 07/05/16  Yes [name], MD  aspirin 81 MG tablet Take 81 mg by mouth daily.     Yes Historical Provider, MD  azelastine (ASTELIN) 0.1 % nasal spray Place 2 sprays into both nostrils 2 (two) times daily. Use in each nostril as directed Patient taking differently: Place 2 sprays into both nostrils at bedtime as needed for rhinitis or allergies. Use in each nostril as directed 09/04/15  Yes Vicie Mutters, PA-C  buPROPion (WELLBUTRIN XL) 150 MG 24 hr tablet Take 1 tablet (150 mg total) by mouth every morning. 05/13/16 05/13/17 Yes Vicie Mutters, PA-C  cetirizine (ZYRTEC) 10 MG tablet TAKE 1 CAPSULE (10 MG TOTAL) BY MOUTH AT BEDTIME. 05/02/16  Yes [name], MD  digoxin (LANOXIN) 0.25 MG tablet TAKE 1 TABLET BY MOUTH DAILY Patient taking differently: TAKE 1/2 TABLET BY MOUTH DAILY 01/23/16  Yes [name], MD  diltiazem (CARDIZEM CD) 120 MG 24 hr capsule Take 1 capsule (120 mg total) by mouth daily. 03/17/16  Yes [name], MD  Fluticasone-Umeclidin-Vilant (TRELEGY ELLIPTA) 100-62.5-25 MCG/INH AEPB Inhale 1 puff into the lungs daily. 05/13/16  Yes Vicie Mutters, PA-C  furosemide (LASIX) 80 MG tablet TAKE 1 TABLET BY MOUTH TWICE A DAY AS NEEDED FOR FLUID Patient taking differently:  40mg  twice daily 05/25/16  Yes [name], MD  gabapentin (NEURONTIN) 300 MG capsule TAKE ONE CAPSULE BY MOUTH 3 TIMES A DAY 05/07/16  Yes Vicie Mutters, PA-C  hydroxypropyl methylcellulose / hypromellose (ISOPTO TEARS / GONIOVISC) 2.5 % ophthalmic solution Place 1 drop into both eyes 4 (four) times daily as needed for dry eyes.    Yes Historical Provider, MD  ipratropium-albuterol (DUONEB) 0.5-2.5 (3) MG/3ML SOLN Inhale 3 ml 4 x/ day or every 4 hours for chronic  asthma 06/16/16 12/16/16 Yes [name], MD  levothyroxine (SYNTHROID, LEVOTHROID) 50 MCG tablet TAKE 1 TABLET EVERY DAY Patient taking differently: TAKE 38mcg TABLET EVERY DAY 03/24/16  Yes [name], MD  meclizine (ANTIVERT) 25 MG tablet Take 1 tablet (25 mg total) by mouth 3 (three) times daily as needed for dizziness. 04/18/15  Yes Courtney Forcucci, PA-C  metolazone (ZAROXOLYN) 2.5 MG tablet TAKE 1 TABLET (2.5 MG TOTAL) BY MOUTH DAILY. 06/18/16  Yes [name], MD  montelukast (SINGULAIR) 10 MG tablet TAKE 1 TABLET EVERY DAY FOR ALLERGIES 05/12/16  Yes [name], MD  potassium chloride SA (KLOR-CON M20) 20 MEQ tablet Take 2 tablets (40 mEq total) by mouth daily. 04/20/16  Yes Vicie Mutters, PA-C  pravastatin (PRAVACHOL) 40 MG tablet TAKE 1 TABLET BY MOUTH AT BEDTIME FOR CHOLESTEROL 06/30/16  Yes [name], MD  prednisoLONE acetate (PRED FORTE) 1 % ophthalmic suspension INSTILL 1 DROP INTO RIGHT EYE once daily 05/12/16  Yes Historical Provider, MD  prochlorperazine (COMPAZINE) 5 MG tablet TAKE 1 TABLET BY MOUTH 3 TIMES A DAY FOR VERTIGO OR NAUSEA 09/16/15  Yes [name], MD  warfarin (COUMADIN) 2 MG tablet TAKE 1 TO 2 TABLETS BY MOUTH DAILY OR AS DIRECTED Patient taking differently: 3mg  daily 04/21/16  Yes Vicie Mutters, PA-C  predniSONE (DELTASONE) 20 MG tablet 1 tab 3 x day for 3 days, then 1 tab 2 x day for 3 days, then 1 tab 1 x day for 5 days 06/30/16   [name], MD    Current Facility-Administered Medications  Medication Dose Route Frequency Provider Last Rate Last Dose  . acetaminophen (TYLENOL) tablet 650 mg  650 mg Oral Q6H PRN Brenton Grills, PA-C   650 mg at 07/14/16 1137   Or  . acetaminophen (TYLENOL) suppository 650 mg  650 mg Rectal Q6H PRN Brenton Grills, PA-C      . allopurinol (ZYLOPRIM) tablet 150 mg  150 mg Oral Daily Brenton Grills, PA-C   150 mg at 07/14/16 0950  . ALPRAZolam Duanne Moron) tablet 0.5 mg  0.5 mg Oral TID PRN Cherene Altes, MD   0.5 mg at 07/13/16 2214  . alum & mag hydroxide-simeth (MAALOX/MYLANTA) 200-200-20 MG/5ML suspension 15 mL  15 mL Oral Q4H PRN Thurnell Lose, MD   15 mL at 07/12/16 2152  . antiseptic oral rinse (BIOTENE) solution 15 mL  15 mL Mouth Rinse PRN Cherene Altes, MD      . aspirin EC tablet 81 mg  81 mg Oral Daily Brenton Grills, PA-C   81 mg at 07/14/16 0950  . budesonide (PULMICORT) nebulizer solution 0.25 mg  0.25 mg Nebulization BID Waldemar Dickens, MD   0.25 mg at 07/14/16 1002  . digoxin (LANOXIN) tablet 0.0625 mg  0.0625 mg Oral Daily Cherene Altes, MD   0.0625 mg at 07/14/16 0950  . enoxaparin (LOVENOX) injection 95 mg  1 mg/kg Subcutaneous Q12H Romona Curls, RPH   95 mg at  07/14/16 1215  . furosemide (LASIX) injection 40 mg  40 mg Intravenous BID Cheryln Manly, NP      . gabapentin (NEURONTIN) capsule 300 mg  300 mg Oral TID Cherene Altes, MD   300 mg at 07/14/16 0950  . HYDROcodone-acetaminophen (NORCO/VICODIN) 5-325 MG per tablet 1-2 tablet  1-2 tablet Oral Q6H PRN Cherene Altes, MD   1 tablet at 07/14/16 0101  . hydroxypropyl methylcellulose / hypromellose (ISOPTO TEARS / GONIOVISC) 2.5 % ophthalmic solution 1 drop  1 drop Both Eyes QID PRN Brenton Grills, PA-C      . ipratropium-albuterol (DUONEB) 0.5-2.5 (3) MG/3ML nebulizer solution 3 mL  3 mL Nebulization Q4H PRN Courage Emokpae, MD      . levothyroxine (SYNTHROID, LEVOTHROID) tablet 50 mcg  50 mcg Oral QAC breakfast Brenton Grills, PA-C   50 mcg at 07/14/16 0949  . metoprolol tartrate (LOPRESSOR) tablet 12.5 mg  12.5 mg Oral BID Cheryln Manly, NP   12.5 mg at 07/14/16 0950  . montelukast (SINGULAIR) tablet 10 mg  10 mg Oral QHS Brenton Grills, PA-C   10 mg at 07/13/16 2214  . ondansetron (ZOFRAN) tablet 4 mg  4 mg Oral Q6H PRN Brenton Grills, PA-C       Or  . ondansetron [facility]) injection 4 mg  4 mg Intravenous Q6H PRN Brenton Grills, PA-C      . polyethylene glycol  (MIRALAX / GLYCOLAX) packet 17 g  17 g Oral Daily PRN Brenton Grills, PA-C   17 g at 07/08/16 1656  . pravastatin (PRAVACHOL) tablet 40 mg  40 mg Oral q1800 Brenton Grills, PA-C   40 mg at 07/13/16 1705  . umeclidinium-vilanterol (ANORO ELLIPTA) 62.5-25 MCG/INH 1 puff  1 puff Inhalation Daily Waldemar Dickens, MD   1 puff at 07/14/16 1006    Allergies as of 07/07/2016 - Review Complete 07/07/2016  Allergen Reaction Noted  . Amiodarone Other (See Comments) 01/03/2013  . Diovan [valsartan] Other (See Comments) 01/03/2013  . Doxycycline Diarrhea and Other (See Comments) 01/03/2013  . Flexeril [cyclobenzaprine] Other (See Comments) 01/03/2013  . Keflex [cephalexin] Diarrhea 01/03/2013  . Verapamil Other (See Comments) 01/03/2013  . Advair diskus [fluticasone-salmeterol] Other (See Comments) 01/03/2013  . Codeine Other (See Comments) 11/22/2008    Family History  Problem Relation Age of Onset  . Cirrhosis Mother   . Cancer Mother 50    PANCREAS  . Heart defect Sister   . Breast cancer Sister     age 7  . Heart disease Sister   . Stroke Sister   . Alcohol abuse Father   . Depression Father   . Hypertension Brother   . Hyperlipidemia Son   . Heart disease Daughter     Social History   Social History  . Marital status: Married    Spouse name: N/A  . Number of children: N/A  . Years of education: N/A   Occupational History  . Not on file.   Social History Main Topics  . Smoking status: Former Smoker    Types: Cigarettes    Quit date: 08/10/2002  . Smokeless tobacco: Never Used     Comment: History of tobacco abuse  . Alcohol use 0.0 oz/week     Comment: rare  . Drug use: No  . Sexual activity: Yes    Birth control/ protection: Post-menopausal   Other Topics Concern  . Not on file   Social History Narrative  Lives in [place] with husband   One daughter    Review of Systems: Positive = bold Gen: Denies any fever, chills, rigors, night sweats,  anorexia, fatigue, weakness, malaise, involuntary weight loss, and sleep disorder CV: Denies chest pain, angina, palpitations, syncope, orthopnea, PND, peripheral edema, and claudication. Resp: Denies dyspnea, cough, sputum, wheezing, coughing up blood. GI: Described in detail in HPI.    GU : Denies urinary burning, blood in urine, urinary frequency, urinary hesitancy, nocturnal urination, and urinary incontinence. MS: Denies joint pain or swelling.  Denies muscle weakness, cramps, atrophy.  Derm: Denies rash, itching, oral ulcerations, hives, unhealing ulcers.  Psych: Denies depression, anxiety, memory loss, suicidal ideation, hallucinations,  and confusion. Heme: Denies bruising, bleeding, and enlarged lymph nodes. Neuro:  Denies any headaches, dizziness, paresthesias. Endo:  Denies any problems with DM, thyroid, adrenal function.  Physical Exam: Vital signs in last 24 hours: Temp:  [97.5 F (36.4 C)-97.9 F (36.6 C)] 97.7 F (36.5 C) (05/02 1207) Pulse Rate:  [78-90] 90 (05/02 1207) Resp:  [16-20] 20 (05/02 1207) BP: (104-127)/(62-76) 104/76 (05/02 1207) SpO2:  [89 %-97 %] 97 % (05/02 1207) Weight:  [92.9 kg (204 lb 14.4 oz)] 92.9 kg (204 lb 14.4 oz) (05/02 0616) Last BM Date: 07/13/16 General:   Somnolent (falls asleep during my interview) but arousable, NAD, obese Head:  Normocephalic and atraumatic. Eyes:  Sclera clear, no icterus.   Conjunctiva pink. Ears:  Normal auditory acuity. Nose:  No deformity, discharge,  or lesions. Mouth:  No deformity or lesions.  Oropharynx pink but dry Neck:  Supple; no masses or thyromegaly. Lungs:  Faint bibasilar crackles, some tachypnea at rest Heart:  Irregular rhythm, normal rate; no murmurs, clicks, rubs,  or gallops. Abdomen:  Soft, obese, nontender and nondistended. No masses, hepatosplenomegaly or hernias noted. Normal bowel sounds, without guarding, and without rebound.     Msk:  Symmetrical without gross deformities. Normal  posture. Pulses:  Normal pulses noted. Extremities:  2+ edema bilateral lower extremities. Neurologic:  Somnolent, but arousable, can answer questions appropriately Skin:  Intact without significant lesions or rashes. Psych:  Alert and cooperative. Depressed mood, flat affect   Lab Results:  Recent Labs  07/12/16 0526 07/13/16 0617 07/14/16 0510  WBC 8.6 10.1 7.4  HGB 12.5 12.9 11.6*  HCT 37.4 38.0 35.5*  PLT 165 161 194   BMET  Recent Labs  07/12/16 0526 07/13/16 1120 07/14/16 0510  NA 134* 136 137  K 4.1 3.7 3.7  CL 101 99* 103  CO2 23 29 25   GLUCOSE 84 91 79  BUN 11 15 12   CREATININE 1.18* 1.36* 1.19*  CALCIUM 8.4* 8.7* 8.2*   LFT  Recent Labs  07/12/16 0526  ALBUMIN 2.8*   PT/INR  Recent Labs  07/13/16 0617 07/14/16 0510  LABPROT 19.5* 18.7*  INR 1.63 1.55    Studies/Results: Dg Chest Port 1 View  Result Date: 07/14/2016 CLINICAL DATA:  Patient coughing up blood this AM. Hx of AFIB. EXAM: PORTABLE CHEST 1 VIEW COMPARISON:  07/07/2016 FINDINGS: The heart is enlarged and stable in configuration. There has been development of bilateral upper lobe airspace filling opacities. Findings are consistent with hemorrhage or infectious infiltrate. The appearance is atypical for edema. IMPRESSION: New bilateral upper lobe airspace filling opacities consistent with hemorrhage and/or infection. Stable cardiomegaly. Electronically Signed   By: Nolon Nations M.D.   On: 07/14/2016 12:45   Impression:  1.  Mental status changes, improving.  Unclear etiology.  Prednisone psychosis, subacute  stroke are possibilities. 2.  Weakness and shortness of breath. 3.  Hemoptysis, scant. 4.  Dysphagia, intermittent, now able to tolerate soup and [place] for lunch. If anything sounds oropharyngeal, possibly related to (sub)acute stroke on MRI.  Plan:  1.  Patient is in no shape whatsoever for consideration of endoscopy, which nevertheless I feel is of fairly low yield. 2.   Follow patient's eating; if she continues eating ok, I wouldn't do anything else for now; if she has recurrent issues, would consider speech therapy consultation +/- water-soluble esophagram. 3.  No indication that her hemoptysis is related to GI tract, and as above she is in absolutely no shape for endoscopic evaluation. 4.  Would pursue PPI and monitoring without invasive Gi testing for the time-being, for reasons as above.  For reassurance purposes, down the road as outpatient, one could certainly consider outpatient endoscopy. 5.  Howie Ill will sign-off; please call with questions; happy to see patient as outpatient if so desired by primary team and/or parimary care physician; thank you for the consultation.   LOS: 7 days   Jamisen Hawes M  07/14/2016, 1:01 PM  Pager [phone] If no answer or after 5 PM call [phone]

## 2016-07-14 NOTE — Progress Notes (Signed)
Paged MD to make him aware that patient coughed up dime sized blood clot.

## 2016-07-14 NOTE — Progress Notes (Addendum)
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  Subjective: Continues to complain of b/l LE neurpathic pain and weakness at this time. She tells me she is only able to bear minimum weight on it due to this pain. Describe her pain is similar to her neuropathic pain but its much worst than it normally is. Otherwise his breathing has improved and doesn't have any complain.  Her daughter is at bedside and tells me she is mentally back to her baseline.   Assessment & Plan:  Acute Encephalopathy / Gait Concerns; improved  -MRI brain done on 4/25 showed 79mm left cerebellar focus demyelination, possible subacute infarct. Evaluated by neuro who thinks its a old stroke and repeat  -ammonia, B12 and folate are normal. RPR/HIV is neg. NO signs of infection  -Hypothyroidism with TSH of 6, currently on Synthroid. Will need repeat TSH check in 4-6 weeks. -Cortisol levels are normal. Hyponatremia has resolved -Need outpatient eval for dementia and follow up geriatrics.   Generalized weakness especially LE -deconditioning and/or hypothyroid related?  Cont PT OT at this time. Checking MRI lumbar spine  Supportive care   Episode of blood with cough and dysphia -resolved at this time. Will cont to monitor.  Patient is on lovenox for A fib eventually will transition to coumadin.   Addendum 1215pm  Patient had another episode of dark red blood and reports of more dysphagia. Unclear etiology of this blood is coming from GI or Pulm, patient is very vague in her symptoms. Her Resp appears to be  stable at this time. Will consult GI to see what they think, meanwhile will cont to monitor her.   Chest pain in patient with known CAD Chronic diastolic CHF and systolic CHF of unclear chronicity  New echo showed ef 45-50% decreased from 60% 6 months ago.  Cardiology following. Will need Further cardiac work up at some point. Today she appears clinically more euvolemic to close to her baseline.  Medical optimization at this time.  Will need to start Low dose ACEI once Renal function stabilizes but should wait because it has just starting to improve.   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 not significantly changed from Oct 2017 - M is appreciable on exam   R cerebellar lesion of unclear etiology  Neurology consulted and feels "75mm left cerebellar focus demyelination, possible subacute infarct.inating lesion versus CVA" - is felt to be old - suggests outpt Neuro f/o - has now signed off - consider f/u MRI as outpt in 6-8 weeks (discussed at length w/ pt and duaghter)  Hyponatremia; improved  Appeared to be hypovolemic in nature - Sodium normalized with gentle volume resuscitation - watch w/ resumption of diuresis   Recent Labs Lab 07/10/16 0228 07/11/16 0402 07/12/16 0526 07/13/16 1120 07/14/16 0510  NA 135 135 134* 136 137    Hypokalemia due to diuretic therapy and poor intake - supplemented to normal range  Chronic Atrial fibrillation Rate controlled -  continue usual medical therapy  Should restart home coumadin if cardiology isnt planning any intervention during this admission.   AKI superimposed on CKD stage III; improved  baseline creatinine approximately [name].3 - creatinine has returned to her baseline with volume resuscitation  Recent Labs Lab 07/10/16 0228 07/11/16 0402 07/12/16 0526 07/13/16 1120 07/14/16 0510  CREATININE [name].31* [name].18* [name].18* [name].36* [name].19*     COPD continue home meds - compensated at this time  Hypothyroidism TSH 6 - reports being on same dose of synthroid "for years" - may simply be altered by acute illness - increased dose as her ROS suggests sx of hypothyroidism - discussed need to keep TSH at ~[name].0   R LE pain/weakness Appears to be sciatica, already on gabapentin. Now has LE weakness which could also be from hypothyroidism  Will order MRI lumbar spine to evaluate it and rule out acute causes. Otherwise would recommend again any surgical intervention at this time.   Addendum 345pm Patient has been evaluated by gastroenterology who does not think patient is shaped to get endoscopy at this time but would recommend starting her on PPI. I will also order for speech and swallow eval for her dysphagia along with barium swallow study/esophagram. After reviewing the chest x-ray looks like she has bilateral upper lobe infiltrates concerns for hemorrhage versus infection. In the setting of hemoptysis and new change since her last x-ray from July 02 2016 I spoke with pulmonary who will come see the patient. Meantime check PTT and I will hold her Lovenox.  DVT prophylaxis: lovenox  Code Status: FULL CODE Family Communication: spoke w/ daughter at bedside at significant length  Disposition Plan: TBD  Consultants:  Neurology  [facility]  Procedures: TTE - noted above   Antimicrobials:  none   Objective: Blood pressure 119/65, pulse 78, temperature 97.5 F (36.4 C), temperature source Oral, resp. rate 18, height 5\' 3"  ([name].6 m), weight 92.9 kg (204 lb 14.4 oz), SpO2 (!) 89 %.  Intake/Output Summary (Last 24 hours) at 07/14/16 1026 Last data filed at 07/14/16 0500  Gross per 24 hour  Intake              240 ml  Output             1600 ml  Net            -1360 ml   Filed Weights   07/12/16 0355 07/13/16 0413 07/14/16 0616  Weight: 95.2 kg (209 lb 14.4 oz) 93.8 kg (206 lb 12.8 oz) 92.9 kg (204 lb 14.4 oz)     Examination: General: No acute respiratory distress - alert and oriented x4 Lungs: Clear to auscultation bilaterally - no wheezing or focal crackles  Cardiovascular: Regular rate with 2/6 systolic M  Abdomen: Nontender, nondistended, soft, bowel sounds positive, no rebound Extremities: stable [name]+ B LE edema   CBC:  Recent Labs Lab 07/10/16 0228 07/11/16 0703 07/12/16 0526 07/13/16 0617 07/14/16 0510  WBC 8.7 9.7 8.6 10.[name] 7.4  HGB 12.8 12.5 12.5 12.9 11.6*  HCT 39.3 37.8 37.4 38.0 35.5*  MCV 92.3 91.7 92.3 93.[name] 92.0  PLT 176 173 165 161 998   Basic Metabolic Panel:  Recent Labs Lab 07/09/16 0239 07/10/16 0228 07/11/16 0402 07/12/16 0526 07/13/16 1120 07/14/16 0510  NA 133* 135 135 134* 136 137  K 3.8 3.7 4.5 4.[name] 3.7 3.7  CL 91* 99* 99* 101 99* 103  CO2 34* 30 27 23 29 25   GLUCOSE 104* 108* 92 84 91  79  BUN 36* 21* 11 11 15 12   CREATININE [name].59* [name].31* [name].18* [name].18* [name].36* [name].19*  CALCIUM 8.9 8.6* 8.8* 8.4* 8.7* 8.2*  MG 2.[name]  --   --   --   --   --   PHOS  --   --   --  2.3*  --   --    GFR: Estimated Creatinine Clearance: 44.9 mL/min (A) (by C-G formula based on SCr of [name].19 mg/dL (H)).  Liver Function Tests:  Recent Labs Lab 07/10/16 0228 07/11/16 0402 07/12/16 0526  AST 22 25  --   ALT 19 22  --   ALKPHOS 31* 33*  --   BILITOT 0.7 [name].[name]  --   PROT 5.[name]* 5.7*  --   ALBUMIN 2.9* 3.2* 2.8*    Recent Labs Lab 07/07/16 1941  AMMONIA 26    Coagulation Profile:  Recent Labs Lab 07/10/16 0228 07/11/16 0402 07/12/16 0526 07/13/16 0617 07/14/16 0510  INR [name].91 [name].50 [name].67 [name].63 [name].55    Cardiac Enzymes:  Recent Labs Lab 07/07/16 2249 07/08/16 0559 07/11/16 0402  TROPONINI 0.08* 0.09* 0.07*    HbA1C: Hgb A1c MFr Bld  Date/Time Value Ref Range Status  06/16/2016 01:54 PM 5.7 (H) <5.7 % Final    Comment:      For someone without known diabetes, a hemoglobin A1c value between 5.7% and 6.4% is consistent with prediabetes and should be  confirmed with a follow-up test.   For someone with known diabetes, a value <7% indicates that their diabetes is well controlled. A1c targets should be individualized based on duration of diabetes, age, co-morbid conditions and other considerations.   This assay result is consistent with an increased risk of diabetes.   Currently, no consensus exists regarding use of hemoglobin A1c for diagnosis of diabetes in children.     03/01/2016 11:28 AM 5.2 <5.7 % Final    Comment:      For the purpose of screening for the presence of diabetes:   <5.7%       Consistent with the absence of diabetes 5.7-6.4 %   Consistent with increased risk for diabetes (prediabetes) >=6.5 %     Consistent with diabetes   This assay result is consistent with a decreased risk of diabetes.   Currently, no consensus exists regarding use of hemoglobin A1c for diagnosis of diabetes in children.   According to American Diabetes Association (ADA) guidelines, hemoglobin A1c <7.0% represents optimal control in non-pregnant diabetic patients. Different metrics may apply to specific patient populations. Standards of Medical Care in Diabetes (ADA).        Recent Results (from the past 240 hour(s))  MRSA PCR Screening     Status: None   Collection Time: 07/07/16 10:23 PM  Result Value Ref Range Status   MRSA by PCR NEGATIVE NEGATIVE Final    Comment:        The GeneXpert MRSA Assay (FDA approved for NASAL specimens only), is one component of a comprehensive MRSA colonization surveillance program. It is not intended to diagnose MRSA infection nor to guide or monitor treatment for MRSA infections.      Scheduled Meds: . allopurinol  150 mg Oral Daily  . aspirin EC  81 mg Oral Daily  . budesonide (PULMICORT) nebulizer solution  0.25 mg Nebulization BID  . digoxin  0.0625 mg Oral Daily  . enoxaparin (LOVENOX) injection  [name] mg/kg Subcutaneous Q12H  . furosemide  20 mg Oral BID  . gabapentin  300 mg  Oral TID  . levothyroxine  50 mcg Oral QAC breakfast  . metoprolol tartrate  12.5 mg Oral BID  . montelukast  10 mg Oral QHS  . pravastatin  40 mg Oral q1800  . umeclidinium-vilanterol  [name] puff Inhalation Daily     LOS: 7 days   Gerlean Ren, MD [facility] Office  [phone] Pager - Text Page per Shea Evans as per below:  On-Call/Text Page:      [email or web address]      password [email or web address]  If 7PM-7AM, please contact night-coverage [email or web address] Password [email or web address] 07/14/2016, 10:26 AM

## 2016-07-14 NOTE — Consult Note (Signed)
   [facility] CM Inpatient Consult   07/14/2016  Katie [name] 1940-05-03 [id number]  Patient assessed for HF as in the [place].  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  Patient has been recommended for a skilled facility stay. Patient's primary care provider is  [name], MD of [facility] and they will follow the patient for transition of care calls.  No [facility] Community Care Management needs at this time.  For questions please call:  Natividad Brood, RN BSN [facility] Liaison  [phone] business mobile phone Toll free office [phone]

## 2016-07-14 NOTE — Clinical Social Work Note (Signed)
CSW went by the room to let the patient know that [facility] can take her when she's stable for discharge. Her daughter entered the room and told her mother that she would not be going there because of their low ratings. Patient expressed that this is her first preference facility. CSW provided other bed offers to patient's daughter. Patient's daughter stated that she is interested in [place] but patient told her she will not go there.   Dayton Scrape, [place]

## 2016-07-14 NOTE — Consult Note (Signed)
Name: Katie [name] MRN: [id number] DOB: 1940-07-05    ADMISSION DATE:  07/07/2016 CONSULTATION DATE:  07/14/16  REFERRING MD :  Reesa Chew  CHIEF COMPLAINT:  Hemoptysis   HISTORY OF PRESENT ILLNESS:  Katie [name] is a 76 y.o. female with a PMH as outlined below.  She was admitted 4/25 with confusion and worsening sciatica and back pain.  She was admitted and seen in consultation by neuro.  Confusion felt to be due to mild metabolic derangements along with probable underlying dementia and possibly old stroke vs demyelinating process (? Prednisone induced).  Following admission, INR noted to be supratherapeutic; therefore, warfarin held.  Once INR normalized, she was started on lovenox.  On morning of 5/2, she had 2 episodes where she coughed up a little bit of blood.  She describes it as "2 pieces about the size of a quarter, loose blood not clots).  She has never had similar symptoms in the past.  She denies any hematemesis, melena, hematochezia.  Denies any exposure to known sick contacts, recent travel internationally, exposure to other individuals who have been out of the country, recent stays in hotels, hx of incarceration.  She is a former smoker, quit some 17 years ago.  She does not have any pets in her home, denies any exposures to birds.  Has not lived in other areas of the country.  She has been retired for 14 years, used to work as a Educational psychologist.   PAST MEDICAL HISTORY :   has a past medical history of Atrial fibrillation ([place]); Migraine headache; and Osteopenia.  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 Prior to Admission medications   Medication Sig Start Date End Date Taking? Authorizing Provider  acetaminophen (TYLENOL) 500 MG tablet Take  1,000 mg by mouth 2 (two) times daily as needed for moderate pain.   Yes Historical Provider, MD  albuterol (PROVENTIL HFA;VENTOLIN HFA) 108 (90 BASE) MCG/ACT inhaler 2 puffs 5 minutes apart every 4 to 6 hours as needed to rescue asthma Patient taking differently: Inhale 2 puffs into the lungs every 4 (four) hours as needed for shortness of breath. Inhale 2 puffs into the lungs 5 minutes apart every 4 to 6 hours as needed to rescue asthma 08/13/13 07/07/16 Yes Melissa Smith, PA-C  allopurinol (ZYLOPRIM) 300 MG tablet TAKE 1 TABLET BY MOUTH EVERY DAY 05/25/16  Yes [name], MD  ALPRAZolam Duanne Moron) 0.5 MG tablet Take 1/2 to 1 tablet tid PRN for anxiety. 07/05/16  Yes [name], MD  aspirin 81 MG tablet Take 81 mg by mouth daily.     Yes Historical Provider, MD  azelastine (ASTELIN) 0.1 % nasal spray Place 2 sprays into both nostrils 2 (two) times daily. Use in each nostril as directed Patient taking differently: Place 2 sprays into both nostrils at bedtime as needed for rhinitis or allergies. Use in each nostril as directed 09/04/15  Yes Vicie Mutters, PA-C  buPROPion (WELLBUTRIN XL) 150 MG 24 hr tablet Take 1 tablet (150 mg total) by mouth every morning. 05/13/16 05/13/17 Yes Vicie Mutters, PA-C  cetirizine (ZYRTEC) 10 MG tablet TAKE 1 CAPSULE (10 MG TOTAL) BY MOUTH AT BEDTIME. 05/02/16  Yes [name], MD  digoxin (LANOXIN) 0.25 MG tablet TAKE 1 TABLET BY MOUTH DAILY Patient taking differently: TAKE 1/2 TABLET BY MOUTH DAILY 01/23/16  Yes  [name], MD  diltiazem (CARDIZEM CD) 120 MG 24 hr capsule Take 1 capsule (120 mg total) by mouth daily. 03/17/16  Yes [name], MD  Fluticasone-Umeclidin-Vilant (TRELEGY ELLIPTA) 100-62.5-25 MCG/INH AEPB Inhale 1 puff into the lungs daily. 05/13/16  Yes Vicie Mutters, PA-C  furosemide (LASIX) 80 MG tablet TAKE 1 TABLET BY MOUTH TWICE A DAY AS NEEDED FOR FLUID Patient taking differently: 40mg  twice daily 05/25/16  Yes [name], MD   gabapentin (NEURONTIN) 300 MG capsule TAKE ONE CAPSULE BY MOUTH 3 TIMES A DAY 05/07/16  Yes Vicie Mutters, PA-C  hydroxypropyl methylcellulose / hypromellose (ISOPTO TEARS / GONIOVISC) 2.5 % ophthalmic solution Place 1 drop into both eyes 4 (four) times daily as needed for dry eyes.    Yes Historical Provider, MD  ipratropium-albuterol (DUONEB) 0.5-2.5 (3) MG/3ML SOLN Inhale 3 ml 4 x/ day or every 4 hours for chronic asthma 06/16/16 12/16/16 Yes [name], MD  levothyroxine (SYNTHROID, LEVOTHROID) 50 MCG tablet TAKE 1 TABLET EVERY DAY Patient taking differently: TAKE 20mcg TABLET EVERY DAY 03/24/16  Yes [name], MD  meclizine (ANTIVERT) 25 MG tablet Take 1 tablet (25 mg total) by mouth 3 (three) times daily as needed for dizziness. 04/18/15  Yes Courtney Forcucci, PA-C  metolazone (ZAROXOLYN) 2.5 MG tablet TAKE 1 TABLET (2.5 MG TOTAL) BY MOUTH DAILY. 06/18/16  Yes [name], MD  montelukast (SINGULAIR) 10 MG tablet TAKE 1 TABLET EVERY DAY FOR ALLERGIES 05/12/16  Yes [name], MD  potassium chloride SA (KLOR-CON M20) 20 MEQ tablet Take 2 tablets (40 mEq total) by mouth daily. 04/20/16  Yes Vicie Mutters, PA-C  pravastatin (PRAVACHOL) 40 MG tablet TAKE 1 TABLET BY MOUTH AT BEDTIME FOR CHOLESTEROL 06/30/16  Yes [name], MD  prednisoLONE acetate (PRED FORTE) 1 % ophthalmic suspension INSTILL 1 DROP INTO RIGHT EYE once daily 05/12/16  Yes Historical Provider, MD  prochlorperazine (COMPAZINE) 5 MG tablet TAKE 1 TABLET BY MOUTH 3 TIMES A DAY FOR VERTIGO OR NAUSEA 09/16/15  Yes [name], MD  warfarin (COUMADIN) 2 MG tablet TAKE 1 TO 2 TABLETS BY MOUTH DAILY OR AS DIRECTED Patient taking differently: 3mg  daily 04/21/16  Yes Vicie Mutters, PA-C  predniSONE (DELTASONE) 20 MG tablet 1 tab 3 x day for 3 days, then 1 tab 2 x day for 3 days, then 1 tab 1 x day for 5 days 06/30/16   [name], MD   Allergies  Allergen Reactions  . Amiodarone Other (See Comments)    PULMONARY  TOXICITY  . Diovan [Valsartan] Other (See Comments)    HYPOTENSION  . Doxycycline Diarrhea and Other (See Comments)    VISUAL DISTURBANCE  . Flexeril [Cyclobenzaprine] Other (See Comments)    FATIGUE  . Keflex [Cephalexin] Diarrhea  . Verapamil Other (See Comments)    EDEMA  . Advair Diskus [Fluticasone-Salmeterol] Other (See Comments)    SKIN CHANGES.  Can tolerate low dose of prednisone without complications  . Codeine Other (See Comments)    unknown    FAMILY HISTORY:  family history includes Alcohol abuse in her father; Breast cancer in her sister; Cancer (age of onset: 48) in her mother; Cirrhosis in her mother; Depression in her father; Heart defect in her sister; Heart disease in her daughter and sister; Hyperlipidemia in her son; Hypertension in her brother; Stroke in her sister. SOCIAL HISTORY:  reports that she quit smoking about 13 years ago. Her smoking use included Cigarettes. She has never used smokeless tobacco. She reports that she drinks  alcohol. She reports that she does not use drugs.  REVIEW OF SYSTEMS:   All negative; except for those that are bolded, which indicate positives.  Constitutional: weight loss, weight gain, night sweats, fevers, chills, fatigue, weakness.  HEENT: headaches, sore throat, sneezing, nasal congestion, post nasal drip, difficulty swallowing, tooth/dental problems, visual complaints, visual changes, ear aches. Neuro: difficulty with speech, weakness, numbness, ataxia, confusion. CV:  chest pain, orthopnea, PND, swelling in lower extremities, dizziness, palpitations, syncope.  Resp: cough, hemoptysis, dyspnea, wheezing. GI: heartburn, indigestion, abdominal pain, nausea, vomiting, diarrhea, constipation, change in bowel habits, loss of appetite, hematemesis, melena, hematochezia.  GU: dysuria, change in color of urine, urgency or frequency, flank pain, hematuria. MSK: joint pain or swelling, decreased range of motion. Psych: change in mood or  affect, depression, anxiety, suicidal ideations, homicidal ideations. Skin: rash, itching, bruising.   SUBJECTIVE:  Has had 2 episodes of coughing up small amounts of blood, roughly 2 samples "the size of a quarter".   VITAL SIGNS: Temp:  [97.5 F (36.4 C)-97.9 F (36.6 C)] 97.7 F (36.5 C) (05/02 1207) Pulse Rate:  [78-90] 90 (05/02 1207) Resp:  [16-20] 20 (05/02 1207) BP: (104-127)/(62-76) 104/76 (05/02 1207) SpO2:  [89 %-97 %] 97 % (05/02 1207) Weight:  [92.9 kg (204 lb 14.4 oz)] 92.9 kg (204 lb 14.4 oz) (05/02 0616)  PHYSICAL EXAMINATION: General: Adult female, resting in bed, in NAD. Neuro: A&O x 3, non-focal.  HEENT: [place]/AT. PERRL, sclerae anicteric. Cardiovascular: RRR, no M/R/G.  Lungs: Respirations even and unlabored.  CTA bilaterally, No W/R/R. Abdomen: BS x 4, soft, NT/ND.  Musculoskeletal: No gross deformities, 2+ edema.  Skin: Intact, warm, no rashes.     Recent Labs Lab 07/12/16 0526 07/13/16 1120 07/14/16 0510  NA 134* 136 137  K 4.1 3.7 3.7  CL 101 99* 103  CO2 23 29 25   BUN 11 15 12   CREATININE 1.18* 1.36* 1.19*  GLUCOSE 84 91 79    Recent Labs Lab 07/12/16 0526 07/13/16 0617 07/14/16 0510  HGB 12.5 12.9 11.6*  HCT 37.4 38.0 35.5*  WBC 8.6 10.1 7.4  PLT 165 161 194   Mr Lumbar Spine Wo Contrast  Result Date: 07/14/2016 CLINICAL DATA:  Bilateral lower extremity neuropathic pain and weakness. EXAM: MRI LUMBAR SPINE WITHOUT CONTRAST TECHNIQUE: Multiplanar, multisequence MR imaging of the lumbar spine was performed. No intravenous contrast was administered. COMPARISON:  CT abdomen and pelvis April 22, 2015 FINDINGS: SEGMENTATION: For the purposes of this report, the last well-formed intervertebral disc will be described as L5-S1. ALIGNMENT: Maintenance of the lumbar lordosis. No malalignment. VERTEBRAE:Vertebral bodies are intact. Intervertebral discs demonstrate normal morphology, decreased T2 signal within the included thoracic and lumbar discs  L4-5 compatible with mild desiccation. Multilevel minimal chronic discogenic endplate changes, less than expected. No abnormal or acute bone marrow signal. CONUS MEDULLARIS: Conus medullaris terminates at T12-L1 and demonstrates normal morphology and signal characteristics. Cauda equina is normal. Subcentimeter bilateral S2 Tarlov cysts. Subcentimeter included thoracic perineural cyst. PARASPINAL AND SOFT TISSUES: Included prevertebral and paraspinal soft tissues are nonacute. Aortoiliac endovascular stent graft better characterized on prior CT. Mild symmetric paraspinal muscle atrophy. Heterogeneous uterus, possible leiomyomata. DISC LEVELS: L1-2: Annular bulging asymmetric to the RIGHT. No canal stenosis or neural foraminal narrowing. L2-3: No disc bulge, canal stenosis nor neural foraminal narrowing. Mild facet arthropathy. L3-4: Annular bulging. Moderate LEFT facet arthropathy and ligamentum flavum redundancy. Mild canal stenosis, no neural foraminal narrowing. L4-5: Annular bulging. Moderate LEFT greater than RIGHT facet arthropathy  and ligamentum flavum redundancy resulting in mild canal stenosis, no neural foraminal narrowing. L5-S1: No disc bulge. Mild RIGHT, severe LEFT facet arthropathy. No canal stenosis or neural foraminal narrowing. IMPRESSION: Less than expected for age degenerative change of the lumbar spine. Mild canal stenosis L3-4 and L4-5. No neural foraminal narrowing. Electronically Signed   By: Elon Alas M.D.   On: 07/14/2016 14:54   Dg Chest Port 1 View  Result Date: 07/14/2016 CLINICAL DATA:  Patient coughing up blood this AM. Hx of AFIB. EXAM: PORTABLE CHEST 1 VIEW COMPARISON:  07/07/2016 FINDINGS: The heart is enlarged and stable in configuration. There has been development of bilateral upper lobe airspace filling opacities. Findings are consistent with hemorrhage or infectious infiltrate. The appearance is atypical for edema. IMPRESSION: New bilateral upper lobe airspace filling  opacities consistent with hemorrhage and/or infection. Stable cardiomegaly. Electronically Signed   By: Nolon Nations M.D.   On: 07/14/2016 12:45    STUDIES:  MRI brain 4/25 > 36mm cerebellar vocus of apparent demyelination though atypical for pts age.  F/u MRI in 6 - 8 weeks. MRI L spine 5/2 > mild canal stenosis L3-5.  SIGNIFICANT EVENTS  4/25 > admit. 5/2 > PCCM consult.  ASSESSMENT / PLAN:  76 year old female with extensive cardiac history to include a-fib on coumadin presenting with a supra therapeutic INR that was stopped and lovenox was started at full dose.  On the day of presenting to PCCM the patient developed 2 episodes of hemoptysis and PCCM was called on consultation.  Hemoptysis resolved and phlegm changed back to yellow.  Full history taken.  No other suspicious signs noted.  On exam, patient is gaining weight and has never had this happen before.  I reviewed chest CT from last October and sub pleural opacities noted.  Discussed with PCCM-NP.  Hemoptysis: likely aspirated blood since after the two clots were coughed up other phlegm is all blood free (not a continuous bleed).             - May continue anti-coagulation.             - Monitor.  Mucous production:             - Insure no aspiration             - Diureses  Pulmonary edema:             - Needs active diureses             - Monitor clinically  Hypoxemia:             - Titrate O2 for sat of 88-92%             - May need ambulatory desaturation study prior to discharge.  PCCM will sign off, please call back if needed.  Patient seen and examined, agree with above note.  I dictated the care and orders written for this patient under my direction.  Rush Farmer, MD [phone]  07/14/2016, 4:38 PM

## 2016-07-15 ENCOUNTER — Ambulatory Visit: Payer: Medicare Other | Admitting: Internal Medicine

## 2016-07-15 LAB — CBC
HCT: 36.7 % (ref 36.0–46.0)
Hemoglobin: 11.7 g/dL — ABNORMAL LOW (ref 12.0–15.0)
MCH: 29.5 pg (ref 26.0–34.0)
MCHC: 31.9 g/dL (ref 30.0–36.0)
MCV: 92.4 fL (ref 78.0–100.0)
PLATELETS: 200 10*3/uL (ref 150–400)
RBC: 3.97 MIL/uL (ref 3.87–5.11)
RDW: 16 % — AB (ref 11.5–15.5)
WBC: 8.4 10*3/uL (ref 4.0–10.5)

## 2016-07-15 LAB — BASIC METABOLIC PANEL
ANION GAP: 8 (ref 5–15)
BUN: 12 mg/dL (ref 6–20)
CALCIUM: 8.6 mg/dL — AB (ref 8.9–10.3)
CO2: 26 mmol/L (ref 22–32)
Chloride: 102 mmol/L (ref 101–111)
Creatinine, Ser: 1.41 mg/dL — ABNORMAL HIGH (ref 0.44–1.00)
GFR calc Af Amer: 41 mL/min — ABNORMAL LOW (ref >60)
GFR, EST NON AFRICAN AMERICAN: 36 mL/min — AB (ref >60)
GLUCOSE: 84 mg/dL (ref 65–99)
POTASSIUM: 3.7 mmol/L (ref 3.5–5.1)
SODIUM: 136 mmol/L (ref 135–145)

## 2016-07-15 LAB — PROTIME-INR
INR: 1.4
PROTHROMBIN TIME: 17.3 s — AB (ref 11.4–15.2)

## 2016-07-15 MED ORDER — WARFARIN - PHARMACIST DOSING INPATIENT
Freq: Every day | Status: DC
Start: 2016-07-15 — End: 2016-07-16
  Administered 2016-07-15: 18:00:00

## 2016-07-15 MED ORDER — ENOXAPARIN SODIUM 100 MG/ML ~~LOC~~ SOLN
1.0000 mg/kg | Freq: Two times a day (BID) | SUBCUTANEOUS | Status: DC
  Administered 2016-07-15 (×2): 90 mg via SUBCUTANEOUS
  Filled 2016-07-15 (×2): qty 1

## 2016-07-15 MED ORDER — WARFARIN SODIUM 3 MG PO TABS
3.0000 mg | ORAL_TABLET | Freq: Once | ORAL | Status: AC
  Administered 2016-07-15: 3 mg via ORAL
  Filled 2016-07-15: qty 1

## 2016-07-15 NOTE — Progress Notes (Addendum)
ANTICOAGULATION CONSULT NOTE - Follow-Up  Pharmacy Consult for lovenox and warfarin Indication: atrial fibrillation  Patient Measurements: Height: 5\' 3"  (160 cm) Weight: 200 lb 8 oz (90.9 kg) IBW/kg (Calculated) : 52.4  Vital Signs: Temp: 97.6 F (36.4 C) (05/03 0551) Temp Source: Oral (05/03 0551) BP: 123/69 (05/03 0551) Pulse Rate: 79 (05/03 0551)  Labs:  Recent Labs  07/13/16 0617 07/13/16 1120 07/14/16 0510 07/14/16 1611 07/15/16 0347  HGB 12.9  --  11.6*  --  11.7*  HCT 38.0  --  35.5*  --  36.7  PLT 161  --  194  --  200  APTT  --   --   --  54*  --   LABPROT 19.5*  --  18.7*  --  17.3*  INR 1.63  --  1.55  --  1.40  CREATININE  --  1.36* 1.19*  --   --     Estimated Creatinine Clearance: 44.4 mL/min (A) (by C-G formula based on SCr of 1.19 mg/dL (H)).   Assessment: 76 yo female on warfarin PTA for atrial fibrillation admitted 4/25 with CP and confusion. Admit INR 3.91 with last dose of warfarin taken 4/25.  INR decreased from 5.45 to 1.91 following 1 mg vitamin K on 4/27. Levothyroxine dose was increased this admit which may increase the INR.  INR down to 1.40 today while warfarin has been on hold. Whle INR remains SUBtherapeutic, pharmacy consulted to dose lovenox. Weight downtrending on diuresis, now ~90 kg. Planning to restart warfarin today.  Patient did cough up blood clots on 5/2, but CBC has remained stable. GI not planning any further workup at this time. Cardiology okay with starting warfarin  Home dose: 3 mg daily  Goal of Therapy:  Monitor platelets by anticoagulation protocol: Yes   Plan:  - Decrease Lovenox to 90 mg (~1mg /kg)  q12h given new weight - Will continue to monitor CBC at least q72h on Lovenox, signs/symptoms of bleeding - Warfarin 3mg  x1 tonight - Daily INR  Myer Peer Grayland Ormond), PharmD  [id number] Pharmacy Resident Pager: [phone] 07/15/2016 6:34 AM

## 2016-07-15 NOTE — Progress Notes (Signed)
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  Subjective: Patient states she feels much better today and slightly more energetic. No complains of shortness of breath, chest pain. She only reports of continued right lower extremity numbness tingling/weakness.  Husband present at bedside who tells me patient does eat lots of canned  fluid at home but tries to be compliant with water intake. He is concerned that patient keeps going into fluid overload despite of taking Lasix 160 milligrams daily.   Assessment & Plan:  Acute Encephalopathy / Gait Concerns; improved  -MRI brain done on 4/25 showed 57mm left cerebellar focus demyelination, possible subacute infarct. Evaluated by neuro who thinks its a old stroke and repeat  -ammonia, B12 and folate are normal. RPR/HIV is neg. NO signs of infection  -Hypothyroidism with TSH of 6, currently on Synthroid. Will need repeat TSH check in 4-6 weeks. -Cortisol levels are normal. Hyponatremia has resolved -Need outpatient eval for dementia and follow up geriatrics.   Generalized weakness especially LE -deconditioning and/or hypothyroid related?  Cont PT OT at this time. Checking MRI lumbar spine  Supportive care   Hemoptysis -It has resolved at this time. -Evaluated by pulmonary. No further intervention at this time. -We'll resume home anticoagulation  Dysphagia -Seen by GI yesterday. Speech eval ordered yesterday. Pending results of this time.   Chest pain in patient with known CAD Chronic diastolic CHF and systolic CHF of unclear  chronicity  New echo showed ef 45-50% decreased from 60% 6 months agocal optimization at this time.  No further workup at this time  Will need to start Low dose ACEI once Renal function stabilizes but should wait because it has just starting to improve.   Filed Weights   07/13/16 0413 07/14/16 0616 07/15/16 0551  Weight: 93.8 kg (206 lb 12.8 oz) 92.9 kg (204 lb 14.4 oz) 90.9 kg (200 lb 8 oz)    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 not significantly changed from Oct 2017 - M is appreciable on exam   R cerebellar lesion of unclear etiology  Neurology consulted and feels "possibilities include demyelinating lesion versus CVA" - is felt to be old - suggests outpt Neuro f/o - has now signed off - consider f/u MRI as outpt in 6-8 weeks (discussed at length w/ pt and duaghter)  Hyponatremia; improved  Appeared to be hypovolemic in nature - Sodium normalized with gentle volume resuscitation - watch w/ resumption of diuresis   Recent Labs Lab 07/11/16 0402 07/12/16 0526 07/13/16 1120 07/14/16 0510 07/15/16 0956  NA 135 134* 136 137 136    Hypokalemia due to diuretic therapy and poor intake - supplemented to normal range  Chronic Atrial fibrillation Rate controlled - continue usual medical therap Restart home Coumadin  AKI superimposed on CKD stage III; improved  baseline creatinine approximately 1.3 - creatinine has returned to her baseline with volume resuscitation  Recent Labs Lab 07/11/16 0402 07/12/16 0526 07/13/16 1120 07/14/16 0510 07/15/16 0956  CREATININE 1.18* 1.18* 1.36* 1.19* 1.41*    COPD continue home meds - compensated at this time  Hypothyroidism TSH 6 - reports being  on same dose of synthroid "for years" - may simply be altered by acute illness - increased dose as her ROS suggests sx of hypothyroidism - discussed need to keep TSH at ~1.0   R LE pain/weakness Appears to be sciatica, already on gabapentin. Now has LE weakness which could also be from  hypothyroidis MRI of the lumbar spine done yesterday did not reveal any acute pathology. She does have lumbar stenosis. I've asked her to be physically active as much as possible and perform stretching exercises.    DVT prophylaxis: lovenox  Code Status: FULL CODE Family Communication:  spoke with the patient and husband was at bedside extensively  Disposition Plan:  discharge in next 24 hours.  Consults  Neurology  [facility]iology  Procedures: TTE - noted above   Antimicrobials:  none   Objective: Blood pressure 125/67, pulse 85, temperature 97.4 F (36.3 C), temperature source Oral, resp. rate 16, height 5\' 3"  (1.6 m), weight 90.9 kg (200 lb 8 oz), SpO2 94 %.  Intake/Output Summary (Last 24 hours) at 07/15/16 1247 Last data filed at 07/15/16 1236  Gross per 24 hour  Intake             1110 ml  Output             3701 ml  Net            -2591 ml   Filed Weights   07/13/16 0413 07/14/16 0616 07/15/16 0551  Weight: 93.8 kg (206 lb 12.8 oz) 92.9 kg (204 lb 14.4 oz) 90.9 kg (200 lb 8 oz)    Examination: General: No acute respiratory distress - alert and oriented x4 Lungs: Clear to auscultation bilaterally - no wheezing or focal crackles  Cardiovascular: Regular rate with 2/6 systolic M  Abdomen: Nontender, nondistended, soft, bowel sounds positive, no rebound Extremities: stable 1+ B LE edema   CBC:  Recent Labs Lab 07/11/16 0703 07/12/16 0526 07/13/16 0617 07/14/16 0510 07/15/16 0347  WBC 9.7 8.6 10.1 7.4 8.4  HGB 12.5 12.5 12.9 11.6* 11.7*  HCT 37.8 37.4 38.0 35.5* 36.7  MCV 91.7 92.3 93.1 92.0 92.4  PLT 173 165 161 194 426   Basic Metabolic Panel:  Recent Labs Lab 07/09/16 0239  07/11/16 0402 07/12/16 0526 07/13/16 1120 07/14/16 0510 07/15/16 0956  NA 133*  < > 135 134* 136 137 136  K 3.8  < > 4.5 4.1 3.7 3.7 3.7  CL 91*  < > 99* 101 99* 103 102  CO2 34*  < > 27 23 29 25 26   GLUCOSE 104*  < > 92 84 91 79 84  BUN 36*  < > 11 11 15 12 12     CREATININE 1.59*  < > 1.18* 1.18* 1.36* 1.19* 1.41*  CALCIUM 8.9  < > 8.8* 8.4* 8.7* 8.2* 8.6*  MG 2.1  --   --   --   --   --   --   PHOS  --   --   --  2.3*  --   --   --   < > = values in this interval not displayed. GFR: Estimated Creatinine Clearance: 37.5 mL/min (A) (by C-G formula based on SCr of 1.41 mg/dL (H)).  Liver Function Tests:  Recent Labs Lab 07/10/16 0228 07/11/16 0402 07/12/16 0526  AST 22 25  --   ALT 19 22  --   ALKPHOS 31* 33*  --   BILITOT 0.7 1.1  --   PROT 5.1*  5.7*  --   ALBUMIN 2.9* 3.2* 2.8*   No results for input(s): AMMONIA in the last 168 hours.  Coagulation Profile:  Recent Labs Lab 07/11/16 0402 07/12/16 0526 07/13/16 0617 07/14/16 0510 07/15/16 0347  INR 1.50 1.67 1.63 1.55 1.40    Cardiac Enzymes:  Recent Labs Lab 07/11/16 0402  TROPONINI 0.07*    HbA1C: Hgb A1c MFr Bld  Date/Time Value Ref Range Status  06/16/2016 01:54 PM 5.7 (H) <5.7 % Final    Comment:      For someone without known diabetes, a hemoglobin A1c value between 5.7% and 6.4% is consistent with prediabetes and should be confirmed with a follow-up test.   For someone with known diabetes, a value <7% indicates that their diabetes is well controlled. A1c targets should be individualized based on duration of diabetes, age, co-morbid conditions and other considerations.   This assay result is consistent with an increased risk of diabetes.   Currently, no consensus exists regarding use of hemoglobin A1c for diagnosis of diabetes in children.     03/01/2016 11:28 AM 5.2 <5.7 % Final    Comment:      For the purpose of screening for the presence of diabetes:   <5.7%       Consistent with the absence of diabetes 5.7-6.4 %   Consistent with increased risk for diabetes (prediabetes) >=6.5 %     Consistent with diabetes   This assay result is consistent with a decreased risk of diabetes.   Currently, no consensus exists regarding use of hemoglobin A1c  for diagnosis of diabetes in children.   According to American Diabetes Association (ADA) guidelines, hemoglobin A1c <7.0% represents optimal control in non-pregnant diabetic patients. Different metrics may apply to specific patient populations. Standards of Medical Care in Diabetes (ADA).        Recent Results (from the past 240 hour(s))  MRSA PCR Screening     Status: None   Collection Time: 07/07/16 10:23 PM  Result Value Ref Range Status   MRSA by PCR NEGATIVE NEGATIVE Final    Comment:        The GeneXpert MRSA Assay (FDA approved for NASAL specimens only), is one component of a comprehensive MRSA colonization surveillance program. It is not intended to diagnose MRSA infection nor to guide or monitor treatment for MRSA infections.      Scheduled Meds: . allopurinol  150 mg Oral Daily  . aspirin EC  81 mg Oral Daily  . budesonide (PULMICORT) nebulizer solution  0.25 mg Nebulization BID  . digoxin  0.0625 mg Oral Daily  . enoxaparin (LOVENOX) injection  1 mg/kg Subcutaneous Q12H  . furosemide  40 mg Intravenous BID  . gabapentin  300 mg Oral TID  . levothyroxine  50 mcg Oral QAC breakfast  . metoprolol tartrate  12.5 mg Oral BID  . montelukast  10 mg Oral QHS  . pantoprazole  40 mg Oral Daily  . pravastatin  40 mg Oral q1800  . umeclidinium-vilanterol  1 puff Inhalation Daily     LOS: 8 days   Gerlean Ren, MD [facility] Office  [phone] Pager - Text Page per Shea Evans as per below:  On-Call/Text Page:      [email or web address]      password [email or web address]  If 7PM-7AM, please contact night-coverage [email or web address] Password [facility] 07/15/2016, 12:47 PM

## 2016-07-15 NOTE — Progress Notes (Signed)
Progress Note  Patient Name: Katie [name] Date of Encounter: 07/15/2016  Primary Cardiologist: Wayne so much better today. No longer requiring O2.   Inpatient Medications    Scheduled Meds: . allopurinol  150 mg Oral Daily  . aspirin EC  81 mg Oral Daily  . budesonide (PULMICORT) nebulizer solution  0.25 mg Nebulization BID  . digoxin  0.0625 mg Oral Daily  . enoxaparin (LOVENOX) injection  1 mg/kg Subcutaneous Q12H  . furosemide  40 mg Intravenous BID  . gabapentin  300 mg Oral TID  . levothyroxine  50 mcg Oral QAC breakfast  . metoprolol tartrate  12.5 mg Oral BID  . montelukast  10 mg Oral QHS  . pantoprazole  40 mg Oral Daily  . pravastatin  40 mg Oral q1800  . umeclidinium-vilanterol  1 puff Inhalation Daily   Continuous Infusions:  PRN Meds: acetaminophen **OR** acetaminophen, ALPRAZolam, alum & mag hydroxide-simeth, antiseptic oral rinse, HYDROcodone-acetaminophen, hydroxypropyl methylcellulose / hypromellose, ipratropium-albuterol, ondansetron **OR** ondansetron (ZOFRAN) IV, polyethylene glycol   Vital Signs    Vitals:   07/14/16 1945 07/15/16 0551 07/15/16 0800 07/15/16 0903  BP:  123/69    Pulse: 87 79    Resp: 18 18    Temp:  97.6 F (36.4 C)    TempSrc:  Oral    SpO2: 98% 95% 90% 92%  Weight:  200 lb 8 oz (90.9 kg)    Height:        Intake/Output Summary (Last 24 hours) at 07/15/16 1000 Last data filed at 07/15/16 0847  Gross per 24 hour  Intake              960 ml  Output             3901 ml  Net            -2941 ml   Filed Weights   07/13/16 0413 07/14/16 0616 07/15/16 0551  Weight: 206 lb 12.8 oz (93.8 kg) 204 lb 14.4 oz (92.9 kg) 200 lb 8 oz (90.9 kg)    Telemetry    AFib rate controlled - Personally Reviewed  ECG    N/A - Personally Reviewed  Physical Exam   General: Well developed, well nourished, female appearing in no acute distress. Head: Normocephalic, atraumatic.  Neck: Supple without bruits,  JVD. Lungs:  Resp regular and unlabored, CTA. Heart: Irreg Irreg, S1, S2, no S3, S4, or murmur; no rub. Abdomen: Soft, non-tender, non-distended with normoactive bowel sounds. No hepatomegaly. No rebound/guarding. No obvious abdominal masses. Extremities: No clubbing, cyanosis, 1+ LE edema. Distal pedal pulses are 2+ bilaterally. Neuro: Alert and oriented X 3. Moves all extremities spontaneously. Psych: Normal affect.  Labs    Chemistry Recent Labs Lab 07/10/16 0228 07/11/16 0402 07/12/16 0526 07/13/16 1120 07/14/16 0510  NA 135 135 134* 136 137  K 3.7 4.5 4.1 3.7 3.7  CL 99* 99* 101 99* 103  CO2 30 27 23 29 25   GLUCOSE 108* 92 84 91 79  BUN 21* 11 11 15 12   CREATININE 1.31* 1.18* 1.18* 1.36* 1.19*  CALCIUM 8.6* 8.8* 8.4* 8.7* 8.2*  PROT 5.1* 5.7*  --   --   --   ALBUMIN 2.9* 3.2* 2.8*  --   --   AST 22 25  --   --   --   ALT 19 22  --   --   --   ALKPHOS 31* 33*  --   --   --  BILITOT 0.7 1.1  --   --   --   GFRNONAA 39* 44* 44* 37* 44*  GFRAA 45* 51* 51* 43* 51*  ANIONGAP 6 9 10 8 9      Hematology Recent Labs Lab 07/13/16 0617 07/14/16 0510 07/15/16 0347  WBC 10.1 7.4 8.4  RBC 4.08 3.86* 3.97  HGB 12.9 11.6* 11.7*  HCT 38.0 35.5* 36.7  MCV 93.1 92.0 92.4  MCH 31.6 30.1 29.5  MCHC 33.9 32.7 31.9  RDW 16.5* 16.0* 16.0*  PLT 161 194 200    Cardiac Enzymes Recent Labs Lab 07/11/16 0402  TROPONINI 0.07*   No results for input(s): TROPIPOC in the last 168 hours.   BNPNo results for input(s): BNP, PROBNP in the last 168 hours.   DDimer No results for input(s): DDIMER in the last 168 hours.    Radiology    Mr Lumbar Spine Wo Contrast  Result Date: 07/14/2016 CLINICAL DATA:  Bilateral lower extremity neuropathic pain and weakness. EXAM: MRI LUMBAR SPINE WITHOUT CONTRAST TECHNIQUE: Multiplanar, multisequence MR imaging of the lumbar spine was performed. No intravenous contrast was administered. COMPARISON:  CT abdomen and pelvis April 22, 2015 FINDINGS:  SEGMENTATION: For the purposes of this report, the last well-formed intervertebral disc will be described as L5-S1. ALIGNMENT: Maintenance of the lumbar lordosis. No malalignment. VERTEBRAE:Vertebral bodies are intact. Intervertebral discs demonstrate normal morphology, decreased T2 signal within the included thoracic and lumbar discs L4-5 compatible with mild desiccation. Multilevel minimal chronic discogenic endplate changes, less than expected. No abnormal or acute bone marrow signal. CONUS MEDULLARIS: Conus medullaris terminates at T12-L1 and demonstrates normal morphology and signal characteristics. Cauda equina is normal. Subcentimeter bilateral S2 Tarlov cysts. Subcentimeter included thoracic perineural cyst. PARASPINAL AND SOFT TISSUES: Included prevertebral and paraspinal soft tissues are nonacute. Aortoiliac endovascular stent graft better characterized on prior CT. Mild symmetric paraspinal muscle atrophy. Heterogeneous uterus, possible leiomyomata. DISC LEVELS: L1-2: Annular bulging asymmetric to the RIGHT. No canal stenosis or neural foraminal narrowing. L2-3: No disc bulge, canal stenosis nor neural foraminal narrowing. Mild facet arthropathy. L3-4: Annular bulging. Moderate LEFT facet arthropathy and ligamentum flavum redundancy. Mild canal stenosis, no neural foraminal narrowing. L4-5: Annular bulging. Moderate LEFT greater than RIGHT facet arthropathy and ligamentum flavum redundancy resulting in mild canal stenosis, no neural foraminal narrowing. L5-S1: No disc bulge. Mild RIGHT, severe LEFT facet arthropathy. No canal stenosis or neural foraminal narrowing. IMPRESSION: Less than expected for age degenerative change of the lumbar spine. Mild canal stenosis L3-4 and L4-5. No neural foraminal narrowing. Electronically Signed   By: Elon Alas M.D.   On: 07/14/2016 14:54   Dg Chest Port 1 View  Result Date: 07/14/2016 CLINICAL DATA:  Patient coughing up blood this AM. Hx of AFIB. EXAM:  PORTABLE CHEST 1 VIEW COMPARISON:  07/07/2016 FINDINGS: The heart is enlarged and stable in configuration. There has been development of bilateral upper lobe airspace filling opacities. Findings are consistent with hemorrhage or infectious infiltrate. The appearance is atypical for edema. IMPRESSION: New bilateral upper lobe airspace filling opacities consistent with hemorrhage and/or infection. Stable cardiomegaly. Electronically Signed   By: Nolon Nations M.D.   On: 07/14/2016 12:45    Cardiac Studies   TTE: 07/10/16  Study Conclusions  - Left ventricle: inferior wall hypokinesis. Systolic function was mildly reduced. The estimated ejection fraction was in the range of 45% to 50%. - Aortic valve: There was mild regurgitation. Valve area (VTI): 1.53 cm^2. Valve area (Vmax): 1.58 cm^2. Valve area (Vmean): 1.57  cm^2. - Mitral valve: There was mild regurgitation. - Left atrium: The atrium was mildly dilated. - Right atrium: The atrium was mildly dilated. - Atrial septum: No defect or patent foramen ovale was identified. - Tricuspid valve: There was moderate regurgitation. - Pericardium, extracardiac: A trivial pericardial effusion was identified posterior to the heart.  Patient Profile     76 y.o. female  PMH of CAD, diastolic HF, abd aortic aneurysm s/p repair, CKD, pulmonary fibrosis, permanent AF, HTN, COPDand GERD who presented with chest pain, and HF  Assessment & Plan    1. Acute systolic CHF - BNP of 60 and CXR negative on admission. Given IVFs for dehydration, now with signs of volume overload.  - Echo this admission showed reduced EF to 45% with inferior WMA. Was on oral lasix, transitioned to IV 40mg  BID. Had 3.4 L UOP yesterday. Much improved. Weight is now down to 200lbs. Would transition back to oral lasix tomorrow.  -- follow BMET  2. Chest pain with minimally elevated flat troponin trend.  - no further episodes of chest pain. Suspect some of this may  have been related to her volume overload.  - no plans for cardiac cath.   3. Permanent atrial fibrillation - Cardizem changed to low dose metoprolol given LV systolic dysfunction. Had some blood in her sputum yesterday. Seen by PCCM and GI, cleared to resume anticoagulation. Would resume coumadin today.   4. HTN - BP stable.   Signed, Reino Bellis, NP  07/15/2016, 10:00 AM    I have personally seen and examined this patient with Reino Bellis, NP. I agree with the assessment and plan as outlined above. She is doing much better after diuresis. I would switch to po Lasix tomorrow. Her LV function is overall unchanged after reviewing old echos. No plans for cath. Restart coumadin today. Metoprolol for rate control. She should be ready for d/c home by tomorrow.   Lauree Chandler 07/15/2016 12:06 PM

## 2016-07-15 NOTE — Clinical Social Work Note (Addendum)
CSW met with patient to see if a SNF decision had been made. Daughter not at bedside. She stated they were still reviewing their options.  Day[place] Scrape, [place] [phone]  1:38 pm Patient alone in her room. CSW asked patient if she was sure that she no longer wanted to go to [facility]. Patient stated that she just wants the facility with the best therapies. Patient's daughter will be at the hospital later today. CSW will follow up.  Dayton Scrape, [place] [phone]  2:06 pm Received call from patient's daughter. She asked that CSW look into facilities in [place] and [place]. Referrals sent. Patient will likely go to SNF by car.  Dayton Scrape, [place]

## 2016-07-15 NOTE — Progress Notes (Signed)
Physical Therapy Treatment Patient Details Name: Katie [name] MRN: [id number] DOB: Oct 17, 1940 Today's Date: 07/15/2016    History of Present Illness Patient is a 76 yo female admitted 07/07/16 with chest pain, headache, confusion.  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      PMH:  CAD, CHF, aortic aneurysm, obesity, CKD, pulmonary fibrosis, Afib, COPD, HTN    PT Comments    Pt is able to perform increased gait distance this session and improved sequencing with RW. O2 sats do drop with activity with pt dropping to 89% after first 50' and increasing back to 91% with instruction on pursed lip breathing and rest. Following gait, O2 sats drop to 85% and require a minute to increase back to 91% following activity. Instructed pt on seated exercises to do daily in order to increase strength.     Follow Up Recommendations  SNF     Equipment Recommendations  Rolling walker with 5" wheels    Recommendations for Other Services       Precautions / Restrictions Precautions Precautions: Fall Precaution Comments: RLE strength and pain are limiting Restrictions Weight Bearing Restrictions: No    Mobility  Bed Mobility               General bed mobility comments: up when PT arrived  Transfers Overall transfer level: Needs assistance Equipment used: Rolling walker (2 wheeled) Transfers: Sit to/from Stand Sit to Stand: Min guard         General transfer comment: cues for hand placement, min guard for safety  Ambulation/Gait Ambulation/Gait assistance: Min guard Ambulation Distance (Feet): 50 Feet (50x2) Assistive device: Rolling walker (2 wheeled) Gait Pattern/deviations: Step-through pattern;Decreased stride length;Wide base of support;Trunk flexed;Shuffle Gait velocity: decreased Gait velocity interpretation: Below normal speed for age/gender General Gait Details: improved sequencing with RW,  improved gait distance and decreased assistance required during gait   Stairs            Wheelchair Mobility    Modified Rankin (Stroke Patients Only) Modified Rankin (Stroke Patients Only) Pre-Morbid Rankin Score: No symptoms Modified Rankin: Moderately severe disability     Balance Overall balance assessment: Needs assistance Sitting-balance support: Feet supported;No upper extremity supported Sitting balance-Leahy Scale: Good     Standing balance support: During functional activity;Single extremity supported Standing balance-Leahy Scale: Fair Standing balance comment: able to stand without AD briefly                            Cognition Arousal/Alertness: Awake/alert Behavior During Therapy: Flat affect Overall Cognitive Status: Within Functional Limits for tasks assessed                                        Exercises General Exercises - Lower Extremity Ankle Circles/Pumps: AROM;Both;20 reps;Supine Quad Sets: AROM;Both;10 reps;Supine Gluteal Sets: AROM;Both;10 reps;Supine    General Comments General comments (skin integrity, edema, etc.): O2 sats checked prior to session and following. dropped to 85% following gait and increased to 91%.       Pertinent Vitals/Pain Pain Assessment: Faces Faces Pain Scale: Hurts a little bit Pain Location: sciatic and back pain Pain Descriptors / Indicators: Sore;Grimacing Pain Intervention(s): Monitored during session;Premedicated before session    Home Living  Prior Function            PT Goals (current goals can now be found in the care plan section) Acute Rehab PT Goals Patient Stated Goal: to get stronger and feel better, less SOB Progress towards PT goals: Progressing toward goals    Frequency    Min 3X/week      PT Plan Current plan remains appropriate    Co-evaluation              AM-PAC PT "6 Clicks" Daily Activity  Outcome  Measure  Difficulty turning over in bed (including adjusting bedclothes, sheets and blankets)?: Total Difficulty moving from lying on back to sitting on the side of the bed? : Total Difficulty sitting down on and standing up from a chair with arms (e.g., wheelchair, bedside commode, etc,.)?: A Little Help needed moving to and from a bed to chair (including a wheelchair)?: A Little Help needed walking in hospital room?: A Little Help needed climbing 3-5 steps with a railing? : A Lot 6 Click Score: 13    End of Session Equipment Utilized During Treatment: Gait belt Activity Tolerance: Patient tolerated treatment well Patient left: in chair;with call bell/phone within reach;with chair alarm set;with family/visitor present Nurse Communication: Mobility status PT Visit Diagnosis: Unsteadiness on feet (R26.81);Other abnormalities of gait and mobility (R26.89);Muscle weakness (generalized) (M62.81)     Time: 1010-1036 PT Time Calculation (min) (ACUTE ONLY): 26 min  Charges:  $Gait Training: 23-37 mins                    G Codes:       Scheryl Marten PT, DPT  [phone]    Jacqulyn Liner Sloan Leiter 07/15/2016, 1:41 PM

## 2016-07-15 NOTE — Evaluation (Signed)
Clinical/Bedside Swallow Evaluation Patient Details  Name: NASHONDA LIMBERG MRN: [id number] Date of Birth: 12/18/1940  Today's Date: 07/15/2016 Time: SLP Start Time (ACUTE ONLY): 45 SLP Stop Time (ACUTE ONLY): 1416 SLP Time Calculation (min) (ACUTE ONLY): 14 min  Past Medical History:  Past Medical History:  Diagnosis Date  . Atrial fibrillation ([place])   . Migraine headache   . Osteopenia    Past Surgical History:  Past Surgical History:  Procedure Laterality Date  . ABDOMINAL AORTIC ENDOVASCULAR STENT GRAFT N/A 04/11/2013   Procedure: ABDOMINAL AORTIC ENDOVASCULAR STENT GRAFT WITH RIGHT FEMORAL PATCH ANGIOPLASTY;  Surgeon: Mal Misty, MD;  Location: [place];  Service: Vascular;  Laterality: N/A;  . BREAST SURGERY     LEFT BREAST BIOPSY  . broken leg Left 1970s  . CARDIAC CATHETERIZATION    . CHOLECYSTECTOMY  1972  . COLONOSCOPY    . EYE SURGERY Bilateral   . Laser vein procedure    . REFRACTIVE SURGERY    . TONSILLECTOMY     HPI:  CATELYNN SPARGER a 76 y.o.femalewith medical history significant of CAD, diastolic CHF, normal aortic aneurysm, morbid obesity, CK D stage III, pulmonary fibrosis, atrial fibrillation, hypothyroidism, COPD, hypertension, GERD who presented to the emergency department with complaints of chest pain, HA and confusion. Per chart difficulty finding words per dtr, decreased memory. Chest x-ray showed stable cardiomegaly and hyper hyperhydration, no active lung disease. MRI 9 mm LEFT cerebellar focus of apparent demyelination though,atypical for a patient of this age. Findings less likely represent subacute infarct. GI consult recommended "follow patient's eating; if she continues eating ok, I wouldn't do anything else for now; if she has recurrent issues, would consider speech therapy consultation +/- water-soluble esophagram. 3. No indication that her hemoptysis is related to GI tract, and as above she is in absolutely no shape for endoscopic evaluation."     Assessment / Plan / Recommendation Clinical Impression  No indications of aspiration across consistencies. Pt and daughter report occassional globus sensation and "gets choked." SLP suspects primary esophageal dysphagia based on description of symptoms. Pt has recently seen GI MD who's notes stated pt is in "no shape for endoscopic evaluation." Educated pt and daughter re: esophageal/precautions. Continue regular texture/thin liquids (use caution with meats and breads). No further ST needed at this time.   SLP Visit Diagnosis: Dysphagia, unspecified (R13.10)    Aspiration Risk   (mild-mod)    Diet Recommendation Regular;Thin liquid   Liquid Administration via: Cup;Straw Medication Administration: Whole meds with liquid Supervision: Patient able to self feed Compensations: Slow rate;Small sips/bites;Follow solids with liquid Postural Changes: Seated upright at 90 degrees;Remain upright for at least 30 minutes after po intake    Other  Recommendations Oral Care Recommendations: Oral care BID   Follow up Recommendations None      Frequency and Duration            Prognosis        Swallow Study   General HPI: CLISTA RAINFORD a 76 y.o.femalewith medical history significant of CAD, diastolic CHF, normal aortic aneurysm, morbid obesity, CK D stage III, pulmonary fibrosis, atrial fibrillation, hypothyroidism, COPD, hypertension, GERD who presented to the emergency department with complaints of chest pain, HA and confusion. Per chart difficulty finding words per dtr, decreased memory. Chest x-ray showed stable cardiomegaly and hyper hyperhydration, no active lung disease. MRI 9 mm LEFT cerebellar focus of apparent demyelination though,atypical for a patient of this age. Findings less likely represent subacute infarct.  GI consult recommended "follow patient's eating; if she continues eating ok, I wouldn't do anything else for now; if she has recurrent issues, would consider speech  therapy consultation +/- water-soluble esophagram. 3. No indication that her hemoptysis is related to GI tract, and as above she is in absolutely no shape for endoscopic evaluation."  Type of Study: Bedside Swallow Evaluation Previous Swallow Assessment:  (none) Diet Prior to this Study: Regular;Thin liquids Temperature Spikes Noted: No Respiratory Status: Room air History of Recent Intubation: No Behavior/Cognition: Alert;Cooperative;Pleasant mood Oral Cavity Assessment: Within Functional Limits Oral Care Completed by SLP: No Oral Cavity - Dentition: Dentures, top;Dentures, bottom Vision: Functional for self-feeding Self-Feeding Abilities: Able to feed self Patient Positioning: Upright in chair Baseline Vocal Quality: Normal Volitional Cough: Strong Volitional Swallow: Able to elicit    Oral/Motor/Sensory Function Overall Oral Motor/Sensory Function: Within functional limits   Ice Chips Ice chips: Not tested   Thin Liquid Thin Liquid: Within functional limits Presentation: Cup    Nectar Thick Nectar Thick Liquid: Not tested   Honey Thick Honey Thick Liquid: Not tested   Puree Puree: Within functional limits   Solid   GO   Solid: Within functional limits        Houston Siren 07/15/2016,2:33 PM   Orbie Pyo Colvin Caroli.[employer] [phone]

## 2016-07-15 NOTE — Progress Notes (Signed)
Patient at 90% on room air at rest with deep breathing exercises. Patient's saturation decreased with ambulation down to 85%. Patient needed to stop intermittently to rest during ambulation with the walker and gait belt. Patient has decreased activity level.

## 2016-07-16 DIAGNOSIS — R072 Precordial pain: Secondary | ICD-10-CM

## 2016-07-16 DIAGNOSIS — Z5189 Encounter for other specified aftercare: Secondary | ICD-10-CM | POA: Diagnosis not present

## 2016-07-16 DIAGNOSIS — I5022 Chronic systolic (congestive) heart failure: Secondary | ICD-10-CM | POA: Diagnosis not present

## 2016-07-16 DIAGNOSIS — R5381 Other malaise: Secondary | ICD-10-CM | POA: Diagnosis not present

## 2016-07-16 DIAGNOSIS — M6281 Muscle weakness (generalized): Secondary | ICD-10-CM | POA: Diagnosis not present

## 2016-07-16 DIAGNOSIS — R2689 Other abnormalities of gait and mobility: Secondary | ICD-10-CM | POA: Diagnosis not present

## 2016-07-16 DIAGNOSIS — N179 Acute kidney failure, unspecified: Secondary | ICD-10-CM | POA: Diagnosis not present

## 2016-07-16 DIAGNOSIS — I5030 Unspecified diastolic (congestive) heart failure: Secondary | ICD-10-CM | POA: Diagnosis not present

## 2016-07-16 DIAGNOSIS — R29898 Other symptoms and signs involving the musculoskeletal system: Secondary | ICD-10-CM | POA: Diagnosis not present

## 2016-07-16 DIAGNOSIS — J449 Chronic obstructive pulmonary disease, unspecified: Secondary | ICD-10-CM | POA: Diagnosis not present

## 2016-07-16 DIAGNOSIS — N189 Chronic kidney disease, unspecified: Secondary | ICD-10-CM

## 2016-07-16 DIAGNOSIS — E871 Hypo-osmolality and hyponatremia: Secondary | ICD-10-CM | POA: Diagnosis not present

## 2016-07-16 DIAGNOSIS — I482 Chronic atrial fibrillation: Secondary | ICD-10-CM | POA: Diagnosis not present

## 2016-07-16 DIAGNOSIS — R2681 Unsteadiness on feet: Secondary | ICD-10-CM | POA: Diagnosis not present

## 2016-07-16 DIAGNOSIS — K219 Gastro-esophageal reflux disease without esophagitis: Secondary | ICD-10-CM | POA: Diagnosis not present

## 2016-07-16 DIAGNOSIS — J9691 Respiratory failure, unspecified with hypoxia: Secondary | ICD-10-CM | POA: Diagnosis not present

## 2016-07-16 DIAGNOSIS — I10 Essential (primary) hypertension: Secondary | ICD-10-CM | POA: Diagnosis not present

## 2016-07-16 DIAGNOSIS — G9340 Encephalopathy, unspecified: Secondary | ICD-10-CM | POA: Diagnosis not present

## 2016-07-16 DIAGNOSIS — R278 Other lack of coordination: Secondary | ICD-10-CM | POA: Diagnosis not present

## 2016-07-16 DIAGNOSIS — I481 Persistent atrial fibrillation: Secondary | ICD-10-CM | POA: Diagnosis not present

## 2016-07-16 DIAGNOSIS — I5021 Acute systolic (congestive) heart failure: Secondary | ICD-10-CM | POA: Diagnosis not present

## 2016-07-16 LAB — PROTIME-INR
INR: 1.34
PROTHROMBIN TIME: 16.7 s — AB (ref 11.4–15.2)

## 2016-07-16 LAB — BASIC METABOLIC PANEL
Anion gap: 12 (ref 5–15)
BUN: 14 mg/dL (ref 6–20)
CHLORIDE: 102 mmol/L (ref 101–111)
CO2: 23 mmol/L (ref 22–32)
Calcium: 8.5 mg/dL — ABNORMAL LOW (ref 8.9–10.3)
Creatinine, Ser: 1.42 mg/dL — ABNORMAL HIGH (ref 0.44–1.00)
GFR calc non Af Amer: 35 mL/min — ABNORMAL LOW (ref >60)
GFR, EST AFRICAN AMERICAN: 41 mL/min — AB (ref >60)
Glucose, Bld: 82 mg/dL (ref 65–99)
POTASSIUM: 3.7 mmol/L (ref 3.5–5.1)
SODIUM: 137 mmol/L (ref 135–145)

## 2016-07-16 LAB — CBC
HEMATOCRIT: 38.1 % (ref 36.0–46.0)
HEMOGLOBIN: 12.5 g/dL (ref 12.0–15.0)
MCH: 30.1 pg (ref 26.0–34.0)
MCHC: 32.8 g/dL (ref 30.0–36.0)
MCV: 91.8 fL (ref 78.0–100.0)
Platelets: 214 10*3/uL (ref 150–400)
RBC: 4.15 MIL/uL (ref 3.87–5.11)
RDW: 15.9 % — ABNORMAL HIGH (ref 11.5–15.5)
WBC: 7.2 10*3/uL (ref 4.0–10.5)

## 2016-07-16 MED ORDER — WARFARIN SODIUM 3 MG PO TABS: 3.0000 mg | ORAL_TABLET | Freq: Once | ORAL | Status: DC

## 2016-07-16 MED ORDER — ALBUTEROL SULFATE HFA 108 (90 BASE) MCG/ACT IN AERS: 2.0000 | INHALATION_SPRAY | RESPIRATORY_TRACT | 0 refills | Status: DC | PRN

## 2016-07-16 MED ORDER — IPRATROPIUM-ALBUTEROL 0.5-2.5 (3) MG/3ML IN SOLN: 3.0000 mL | Freq: Four times a day (QID) | RESPIRATORY_TRACT | 1 refills | Status: DC | PRN

## 2016-07-16 MED ORDER — UMECLIDINIUM-VILANTEROL 62.5-25 MCG/INH IN AEPB: 1.0000 | INHALATION_SPRAY | Freq: Every day | RESPIRATORY_TRACT | Status: DC

## 2016-07-16 MED ORDER — METOPROLOL TARTRATE 25 MG PO TABS: 12.5000 mg | ORAL_TABLET | Freq: Two times a day (BID) | ORAL | Status: DC

## 2016-07-16 MED ORDER — FUROSEMIDE 40 MG PO TABS: ORAL_TABLET | ORAL | Status: DC

## 2016-07-16 MED ORDER — BUDESONIDE 0.25 MG/2ML IN SUSP: 0.2500 mg | Freq: Two times a day (BID) | RESPIRATORY_TRACT | 12 refills | Status: DC

## 2016-07-16 MED ORDER — DIGOXIN 62.5 MCG PO TABS: 0.0625 ug | ORAL_TABLET | Freq: Every day | ORAL | Status: DC

## 2016-07-16 MED ORDER — WARFARIN SODIUM 2 MG PO TABS: ORAL_TABLET | ORAL | 0 refills | Status: DC

## 2016-07-16 MED ORDER — PANTOPRAZOLE SODIUM 40 MG PO TBEC: 40.0000 mg | DELAYED_RELEASE_TABLET | Freq: Every day | ORAL | Status: DC

## 2016-07-16 MED ORDER — ALPRAZOLAM 0.5 MG PO TABS: ORAL_TABLET | ORAL | 0 refills | Status: DC

## 2016-07-16 NOTE — Progress Notes (Signed)
Patient returns from visiting her daughter in ICU

## 2016-07-16 NOTE — Care Management Important Message (Signed)
Important Message  Patient Details  Name: Katie [name] MRN: [id number] Date of Birth: 1940/12/02   Medicare Important Message Given:  Yes    Katie [name] 07/16/2016, 11:23 AM

## 2016-07-16 NOTE — Discharge Summary (Signed)
PATIENT DETAILS Name: Katie [name] Age: 76 y.o. Sex: female Date of Birth: 11/02/1940 MRN: [id number]. Admitting Physician: Waldemar Dickens, MD XBL:[id number] DAVID, MD  Admit Date: 07/07/2016 Discharge date: 07/16/2016  Recommendations for Outpatient Follow-up:  1. Follow up with PCP in 1-2 weeks 2. Please obtain BMP/CBC in one week 3. Please check INR in the next 1-2 days-restarted on Coumadin on 5/3. He is continuing to adjust dosing of Coumadin accordingly. 4. Please ensure follow-up with cardiology. 5. Check daily weights, assess volume status and adjust dosing of diuretics accordingly. 6. Recheck TSH in 3 months-adjust dosing of Synthroid accordingly. 7. Repeat MRI brain in 3 months 8. Will need outpatient follow-up with neurology-deferred to outpatient M.D. or attending M.D. at SNF.  Admitted From:  Home  Disposition: [place]: No  Equipment/Devices: None  Discharge Condition: Stable  CODE STATUS: FULL CODE  Diet recommendation:  Heart Healthy  Brief Summary: See H&P, Labs, Consult and Test reports for all details in brief, Patient is a 76 year old female with history of chronic diastolic heart failure, atrial fibrillation on anticoagulation who presented to the hospital for evaluation of confusion, chest pain and headache. See below for further details  Brief Hospital Course: Acute encephalopathy: Resolved, she is awake and alert. Etiology of acute encephalopathy not clear. Underwent extensive evaluation while inpatient. Ammonia, B12, folate, RPR, HIV were all negative. There was no signs of infection. 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  Generalized weakness: On my exam this morning-patient reports significant improvement-I do not see any lower extremity weakness this  morning. MRI of the entire spine do not show any acute abnormalities. Stable for outpatient follow-up with neurology, and continued PT evaluation. Suspect should improve with time. As noted above, will need outpatient follow-up with neurology.  Hemoptysis: This was transient, evaluated by pulmonology-no further workup required. Anticoagulation has been resumed.  Dysphagia: Underwent GI evaluation, and subsequent speech therapy evaluation. Okay to resume a regular diet. Denied any dysphagia issues to me this morning.  Acute on chronic systolic heart failure (EF 45-50%): Followed closely by cardiology, who volume status remained stable. Recommendations are to transition to Lasix dosing on discharge. Patient will need close outpatient follow-up with cardiology. Please check daily weights, and assess volume status-and adjust dosing of diuretics accordingly.  Chest pain: Resolved, felt to be related to volume overload.  Permanent atrial fibrillation: Rate controlled-continue metoprolol and digitoxin. Coumadin resumed on 5/3-INR remains subtherapeutic-please check INR in the next day or so at SNF, and continue to adjust dosing of Coumadin.  Hypertension: Blood pressure remains stable. Continue to follow up and adjust dosing accordingly.  Hyponatremia: Resolved, thought to be secondary to some amount of dehydration.  Hypokalemia: Likely due to diuretic therapy-resolved.  Acute kidney injury on chronic kidney disease stage III: AKI likely hemodynamically mediated-back to baseline with supportive measures.  COPD: Stable throughout this hospitalization-resume inhaler regimen on discharge.  Hypothyroidism: TSH minimally elevated at 6-continue Synthroid, and recheck TSH in 3 months.  Procedures/Studies: Echo 4/28>> - Left ventricle: inferior wall hypokinesis. Systolic function was   mildly reduced. The estimated ejection fraction was in the range   of 45% to 50%. - Aortic valve: There was mild  regurgitation. Valve area (VTI):   1.53 cm^2. Valve area (Vmax): 1.58 cm^2. Valve area (Vmean): 1.57  cm^2. - Mitral valve: There was mild regurgitation. - Left atrium: The atrium was mildly dilated. - Right atrium: The atrium was mildly dilated. - Atrial septum: No defect or patent foramen ovale was identified. - Tricuspid valve: There was moderate regurgitation. - Pericardium, extracardiac: A trivial pericardial effusion was   identified posterior to the heart.  Discharge Diagnoses:  Principal Problem:   Acute encephalopathy Active Problems:   Essential hypertension   Atrial fibrillation (HCC)   Congestive heart failure (HCC)   COPD (chronic obstructive pulmonary disease) with chronic bronchitis (HCC)   Hyperlipidemia   Chest pain   Hyponatremia   Acute kidney injury superimposed on chronic kidney disease (HCC)   Acute respiratory failure with hypoxia (HCC)   Acute systolic CHF (congestive heart failure) ([place])   Discharge Instructions:  Activity:  As tolerated with Full fall precautions use walker/cane & assistance as needed  Discharge Instructions    (HEART FAILURE PATIENTS) Call MD:  Anytime you have any of the following symptoms: 1) 3 pound weight gain in 24 hours or 5 pounds in 1 week 2) shortness of breath, with or without a dry hacking cough 3) swelling in the hands, feet or stomach 4) if you have to sleep on extra pillows at night in order to breathe.    Complete by:  As directed    Ambulatory referral to Neurology    Complete by:  As directed    An appointment is requested in approximately: 4 weeks   Diet - low sodium heart healthy    Complete by:  As directed    Increase activity slowly    Complete by:  As directed      Allergies as of 07/16/2016      Reactions   Amiodarone Other (See Comments)   PULMONARY TOXICITY   Diovan [valsartan] Other (See Comments)   HYPOTENSION   Doxycycline Diarrhea, Other (See Comments)   VISUAL DISTURBANCE   Flexeril  [cyclobenzaprine] Other (See Comments)   FATIGUE   Keflex [cephalexin] Diarrhea   Verapamil Other (See Comments)   EDEMA   Advair Diskus [fluticasone-salmeterol] Other (See Comments)   SKIN CHANGES.  Can tolerate low dose of prednisone without complications   Codeine Other (See Comments)   unknown      Medication List    STOP taking these medications   aspirin 81 MG tablet   diltiazem 120 MG 24 hr capsule Commonly known as:  CARDIZEM CD   metolazone 2.5 MG tablet Commonly known as:  ZAROXOLYN   prednisoLONE acetate 1 % ophthalmic suspension Commonly known as:  PRED FORTE   predniSONE 20 MG tablet Commonly known as:  DELTASONE     TAKE these medications   acetaminophen 500 MG tablet Commonly known as:  TYLENOL Take 1,000 mg by mouth 2 (two) times daily as needed for moderate pain.   albuterol 108 (90 Base) MCG/ACT inhaler Commonly known as:  PROVENTIL HFA;VENTOLIN HFA Inhale 2 puffs into the lungs every 4 (four) hours as needed for shortness of breath. Inhale 2 puffs into the lungs 5 minutes apart every 4 to 6 hours as needed to rescue asthma What changed:  how much to take  how to take this  when to take this  reasons to take this  additional instructions   allopurinol 300 MG tablet Commonly known as:  ZYLOPRIM TAKE 1 TABLET BY MOUTH EVERY DAY   ALPRAZolam 0.5 MG tablet Commonly known as:  XANAX Take 1/2 to 1 tablet tid PRN for anxiety.  azelastine 0.1 % nasal spray Commonly known as:  ASTELIN Place 2 sprays into both nostrils 2 (two) times daily. Use in each nostril as directed What changed:  when to take this  reasons to take this  additional instructions   budesonide 0.25 MG/2ML nebulizer solution Commonly known as:  PULMICORT Take 2 mLs (0.25 mg total) by nebulization 2 (two) times daily.   buPROPion 150 MG 24 hr tablet Commonly known as:  WELLBUTRIN XL Take 1 tablet (150 mg total) by mouth every morning.   cetirizine 10 MG  tablet Commonly known as:  ZYRTEC TAKE 1 CAPSULE (10 MG TOTAL) BY MOUTH AT BEDTIME.   Digoxin 62.5 MCG Tabs Take 0.0625 mcg by mouth daily. What changed:  See the new instructions.   Fluticasone-Umeclidin-Vilant 100-62.5-25 MCG/INH Aepb Commonly known as:  TRELEGY ELLIPTA Inhale 1 puff into the lungs daily.   furosemide 40 MG tablet Commonly known as:  LASIX 40mg  twice daily What changed:  See the new instructions.   gabapentin 300 MG capsule Commonly known as:  NEURONTIN TAKE ONE CAPSULE BY MOUTH 3 TIMES A DAY   hydroxypropyl methylcellulose / hypromellose 2.5 % ophthalmic solution Commonly known as:  ISOPTO TEARS / GONIOVISC Place 1 drop into both eyes 4 (four) times daily as needed for dry eyes.   ipratropium-albuterol 0.5-2.5 (3) MG/3ML Soln Commonly known as:  DUONEB Inhale 3 mLs into the lungs every 6 (six) hours as needed. What changed:  how much to take  how to take this  when to take this  reasons to take this  additional instructions   levothyroxine 50 MCG tablet Commonly known as:  SYNTHROID, LEVOTHROID TAKE 1 TABLET EVERY DAY What changed:  See the new instructions.   meclizine 25 MG tablet Commonly known as:  ANTIVERT Take 1 tablet (25 mg total) by mouth 3 (three) times daily as needed for dizziness.   metoprolol tartrate 25 MG tablet Commonly known as:  LOPRESSOR Take 0.5 tablets (12.5 mg total) by mouth 2 (two) times daily.   montelukast 10 MG tablet Commonly known as:  SINGULAIR TAKE 1 TABLET EVERY DAY FOR ALLERGIES   pantoprazole 40 MG tablet Commonly known as:  PROTONIX Take 1 tablet (40 mg total) by mouth daily.   potassium chloride SA 20 MEQ tablet Commonly known as:  KLOR-CON M20 Take 2 tablets (40 mEq total) by mouth daily.   pravastatin 40 MG tablet Commonly known as:  PRAVACHOL TAKE 1 TABLET BY MOUTH AT BEDTIME FOR CHOLESTEROL   prochlorperazine 5 MG tablet Commonly known as:  COMPAZINE TAKE 1 TABLET BY MOUTH 3 TIMES A DAY  FOR VERTIGO OR NAUSEA   umeclidinium-vilanterol 62.5-25 MCG/INH Aepb Commonly known as:  ANORO ELLIPTA Inhale 1 puff into the lungs daily. Start taking on:  07/17/2016   warfarin 2 MG tablet Commonly known as:  COUMADIN 3mg  daily What changed:  See the new instructions.       Contact information for follow-up providers    MCKEOWN,WILLIAM DAVID, MD. Schedule an appointment as soon as possible for a visit in 1 week(s).   Specialty:  Internal Medicine Contact information: [address] [place] [address] [phone]        Cristopher Peru, MD. Schedule an appointment as soon as possible for a visit in 2 week(s).   Specialty:  Cardiology Contact information: [address] [place] [address] [phone]            Contact information for after-discharge care  Destination    [facility] Follow up.   Specialty:  [place] information: [address] [place] [phone]                 Allergies  Allergen Reactions  . Amiodarone Other (See Comments)    PULMONARY TOXICITY  . Diovan [Valsartan] Other (See Comments)    HYPOTENSION  . Doxycycline Diarrhea and Other (See Comments)    VISUAL DISTURBANCE  . Flexeril [Cyclobenzaprine] Other (See Comments)    FATIGUE  . Keflex [Cephalexin] Diarrhea  . Verapamil Other (See Comments)    EDEMA  . Advair Diskus [Fluticasone-Salmeterol] Other (See Comments)    SKIN CHANGES.  Can tolerate low dose of prednisone without complications  . Codeine Other (See Comments)    unknown    Consultations:   cardiology, pulmonary/intensive care and GI  Other Procedures/Studies: Dg Chest 2 View  Result Date: 07/07/2016 CLINICAL DATA:  Mid chest pain, dizziness, low blood pressure chest x-ray 411 1,018 EXAM: CHEST  2 VIEW COMPARISON:  None. FINDINGS: The lungs remain clear and somewhat hyperaerated. Mediastinal and hilar  contours are unremarkable. Mild cardiomegaly is stable. No acute bony abnormality is seen. IMPRESSION: Stable cardiomegaly and hyperaeration.  No active lung disease. Electronically Signed   By: Ivar Drape M.D.   On: 07/07/2016 13:41   Dg Chest 2 View  Result Date: 06/23/2016 CLINICAL DATA:  Dyspnea EXAM: CHEST  2 VIEW COMPARISON:  06/01/2016 chest radiograph. FINDINGS: Partially visualized abdominal aortic stent graft in the upper abdominal aorta. Stable cardiomediastinal silhouette with mild cardiomegaly and aortic atherosclerosis. No pneumothorax. No pleural effusion. Emphysema. Borderline lung hyperinflation. No pulmonary edema. No acute consolidative airspace disease. IMPRESSION: 1. Emphysema and borderline lung hyperinflation, which may indicate COPD. No acute pulmonary disease. 2. Stable cardiomegaly without pulmonary edema. 3. Aortic atherosclerosis. Electronically Signed   By: Ilona Sorrel M.D.   On: 06/23/2016 10:31   Mr Brain Wo Contrast  Result Date: 07/07/2016 CLINICAL DATA:  Encephalopathy. Word-finding difficulties for a few months, more forgetful. History of atrial fibrillation, migraine headache, pulmonary fibrosis. EXAM: MRI HEAD WITHOUT CONTRAST TECHNIQUE: Multiplanar, multiecho pulse sequences of the brain and surrounding structures were obtained without intravenous contrast. GFR 22. COMPARISON:  CT HEAD January 12, 2014 FINDINGS: BRAIN: No reduced diffusion to suggest acute ischemia. No susceptibility artifact to suggest hemorrhage. 9 mm rounded focus of bright T2 and low T1 signal LEFT cerebellum with intermediate FLAIR signal and, T2 shine through. Patchy supratentorial white matter FLAIR T2 hyperintensities without midline shift or mass effect. Ventricles and sulci are normal for patient's age. No abnormal extra-axial fluid collections. VASCULAR: Normal major intracranial vascular flow voids present at skull base. SKULL AND UPPER CERVICAL SPINE: No abnormal sellar expansion. No  suspicious calvarial bone marrow signal. Craniocervical junction maintained. SINUSES/ORBITS: The mastoid air-cells and included paranasal sinuses are well-aerated. The included ocular globes and orbital contents are non-suspicious. Status post bilateral ocular lens implants. OTHER: Patient is edentulous. IMPRESSION: 9 mm LEFT cerebellar focus of apparent demyelination though, atypical for a patient of this age. Findings less likely represent subacute infarct. Given patient's GFR 22, consider consultation with nephrology prior to contrast administration. Recommend follow-up MRI of the brain in 6-8 weeks, with contrast if able. Moderate chronic small vessel ischemic disease. Electronically Signed   By: Elon Alas M.D.   On: 07/07/2016 19:09   Mr Cervical Spine Wo Contrast  Result Date: 07/08/2016 CLINICAL DATA:  Follow-up abnormal brain MRI.  A few months of word-finding difficulties and forgetfulness. History of migraine headaches and atrial fibrillation. Possible LEFT cerebellar demyelination. Assess for spinal demyelination. EXAM: MRI CERVICAL, THORACIC SPINE WITHOUT CONTRAST TECHNIQUE: Multiplanar and multiecho pulse sequences of the cervical spine, to include the craniocervical junction and cervicothoracic junction, and thoracic spine, were obtained without intravenous contrast. COMPARISON:  MRI of the head July 07, 2016 FINDINGS: MRI CERVICAL SPINE FINDINGS ALIGNMENT: Straightened cervical lordosis.  No malalignment. VERTEBRAE/DISCS: Vertebral bodies are intact. Moderate C4-5 and C5-6 disc height loss, mild at C6-7 with mild desiccation and mild subacute on chronic discogenic endplate changes. Minimal chronic discogenic endplate changes Y8-6. No abnormal or acute bone marrow signal. CORD:Cervical spinal cord is normal morphology and signal characteristics from the cervicomedullary junction to level of T1-2, the most caudal well visualized level. POSTERIOR FOSSA, VERTEBRAL ARTERIES, PARASPINAL TISSUES:  No MR findings of ligamentous injury. Vertebral artery flow voids present. Included posterior fossa and paraspinal soft tissues are normal. DISC LEVELS (mildly motion degraded axial sequences): C2-3: Uncovertebral hypertrophy and mild facet arthropathy without canal stenosis or neural foraminal narrowing. C3-4: Annular bulging, uncovertebral hypertrophy. Moderate facet arthropathy without canal stenosis or neural foraminal narrowing. C4-5: Small broad-based disc bulge, uncovertebral hypertrophy mild facet arthropathy. Mild canal stenosis. Moderate neural foraminal narrowing. C5-6: Small broad-based disc bulge, uncovertebral hypertrophy. Mild canal stenosis. Moderate neural foraminal narrowing. C6-7: Small broad-based disc bulge, uncovertebral hypertrophy. No canal stenosis. Moderate LEFT neural foraminal narrowing. Subcentimeter RIGHT C7 perineural cyst. C7-T1: No disc bulge, canal stenosis nor neural foraminal narrowing. Moderate to severe LEFT facet arthropathy. Subcentimeter C8 perineural cysts. MRI THORACIC SPINE FINDINGS ALIGNMENT: Maintenance of the thoracic kyphosis. No malalignment. VERTEBRAE/DISCS: Vertebral bodies are intact. Mild C4-5 and C5-6 disc height loss, disc desiccation all thoracic disc compatible with degenerative discs. Mild subacute on chronic discogenic endplate changes V7-8. No abnormal or acute bone marrow signal. Scattered old Schmorl's nodes. Scattered hemangiomata. CORD: Thoracic spinal cord is normal morphology and signal characteristics to the level of the conus medullaris which terminates at T12-L1. Ventral spinal cord deformity at T5-6 as described below. Multilevel subcentimeter perineural cysts. PREVERTEBRAL AND PARASPINAL SOFT TISSUES: Similar are mild subcarinal lymphadenopathy. Ectatic thoracic aorta. DISC LEVELS: T4-5: Small RIGHT central disc protrusion without canal stenosis or neural foraminal narrowing. T5-6: 5 x 9 x 11 mm RIGHT central disc extrusion, calcified on prior CT  results and mild canal stenosis and ventral cord deformity. No neural foraminal narrowing. T6-7: Small central disc protrusion without canal stenosis or neural foraminal narrowing. T10-11: Tiny LEFT central disc protrusion. No disc bulge, canal stenosis nor neural foraminal narrowing at any remaining level. IMPRESSION: MRI CERVICAL SPINE: No findings of demyelination, contrast enhanced sequences are more sensitive for acute demyelination. 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 Moderate C4-5 through C6-7 neural foraminal narrowing. MRI THORACIC SPINE: No findings of demyelination, contrast enhanced sequences are more sensitive for acute demyelination. Re- demonstration of 5 x 9 x 11 mm T5-6 calcified extrusion resulting in mild canal stenosis. Electronically Signed   By: Elon Alas M.D.   On: 07/08/2016 20:59   Mr Thoracic Spine Wo Contrast  Result Date: 07/08/2016 CLINICAL DATA:  Follow-up abnormal brain MRI. A few months of word-finding difficulties and forgetfulness. History of migraine headaches and atrial fibrillation. Possible LEFT cerebellar demyelination. Assess for spinal demyelination. EXAM: MRI CERVICAL, THORACIC SPINE WITHOUT CONTRAST TECHNIQUE: Multiplanar and multiecho pulse sequences of the cervical spine, to include the craniocervical junction and cervicothoracic junction, and thoracic spine,  were obtained without intravenous contrast. COMPARISON:  MRI of the head July 07, 2016 FINDINGS: MRI CERVICAL SPINE FINDINGS ALIGNMENT: Straightened cervical lordosis.  No malalignment. VERTEBRAE/DISCS: Vertebral bodies are intact. Moderate C4-5 and C5-6 disc height loss, mild at C6-7 with mild desiccation and mild subacute on chronic discogenic endplate changes. Minimal chronic discogenic endplate changes F6-3. No abnormal or acute bone marrow signal. CORD:Cervical spinal cord is normal morphology and signal characteristics from the cervicomedullary junction to  level of T1-2, the most caudal well visualized level. POSTERIOR FOSSA, VERTEBRAL ARTERIES, PARASPINAL TISSUES: No MR findings of ligamentous injury. Vertebral artery flow voids present. Included posterior fossa and paraspinal soft tissues are normal. DISC LEVELS (mildly motion degraded axial sequences): C2-3: Uncovertebral hypertrophy and mild facet arthropathy without canal stenosis or neural foraminal narrowing. C3-4: Annular bulging, uncovertebral hypertrophy. Moderate facet arthropathy without canal stenosis or neural foraminal narrowing. C4-5: Small broad-based disc bulge, uncovertebral hypertrophy mild facet arthropathy. Mild canal stenosis. Moderate neural foraminal narrowing. C5-6: Small broad-based disc bulge, uncovertebral hypertrophy. Mild canal stenosis. Moderate neural foraminal narrowing. C6-7: Small broad-based disc bulge, uncovertebral hypertrophy. No canal stenosis. Moderate LEFT neural foraminal narrowing. Subcentimeter RIGHT C7 perineural cyst. C7-T1: No disc bulge, canal stenosis nor neural foraminal narrowing. Moderate to severe LEFT facet arthropathy. Subcentimeter C8 perineural cysts. MRI THORACIC SPINE FINDINGS ALIGNMENT: Maintenance of the thoracic kyphosis. No malalignment. VERTEBRAE/DISCS: Vertebral bodies are intact. Mild C4-5 and C5-6 disc height loss, disc desiccation all thoracic disc compatible with degenerative discs. Mild subacute on chronic discogenic endplate changes W4-6. No abnormal or acute bone marrow signal. Scattered old Schmorl's nodes. Scattered hemangiomata. CORD: Thoracic spinal cord is normal morphology and signal characteristics to the level of the conus medullaris which terminates at T12-L1. Ventral spinal cord deformity at T5-6 as described below. Multilevel subcentimeter perineural cysts. PREVERTEBRAL AND PARASPINAL SOFT TISSUES: Similar are mild subcarinal lymphadenopathy. Ectatic thoracic aorta. DISC LEVELS: T4-5: Small RIGHT central disc protrusion without canal  stenosis or neural foraminal narrowing. T5-6: 5 x 9 x 11 mm RIGHT central disc extrusion, calcified on prior CT results and mild canal stenosis and ventral cord deformity. No neural foraminal narrowing. T6-7: Small central disc protrusion without canal stenosis or neural foraminal narrowing. T10-11: Tiny LEFT central disc protrusion. No disc bulge, canal stenosis nor neural foraminal narrowing at any remaining level. IMPRESSION: MRI CERVICAL SPINE: No findings of demyelination, contrast enhanced sequences are more sensitive for acute demyelination. 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 Moderate C4-5 through C6-7 neural foraminal narrowing. MRI THORACIC SPINE: No findings of demyelination, contrast enhanced sequences are more sensitive for acute demyelination. Re- demonstration of 5 x 9 x 11 mm T5-6 calcified extrusion resulting in mild canal stenosis. Electronically Signed   By: Elon Alas M.D.   On: 07/08/2016 20:59   Mr Lumbar Spine Wo Contrast  Result Date: 07/14/2016 CLINICAL DATA:  Bilateral lower extremity neuropathic pain and weakness. EXAM: MRI LUMBAR SPINE WITHOUT CONTRAST TECHNIQUE: Multiplanar, multisequence MR imaging of the lumbar spine was performed. No intravenous contrast was administered. COMPARISON:  CT abdomen and pelvis April 22, 2015 FINDINGS: SEGMENTATION: For the purposes of this report, the last well-formed intervertebral disc will be described as L5-S1. ALIGNMENT: Maintenance of the lumbar lordosis. No malalignment. VERTEBRAE:Vertebral bodies are intact. Intervertebral discs demonstrate normal morphology, decreased T2 signal within the included thoracic and lumbar discs L4-5 compatible with mild desiccation. Multilevel minimal chronic discogenic endplate changes, less than expected. No abnormal or acute bone marrow signal. CONUS MEDULLARIS:  Conus medullaris terminates at T12-L1 and demonstrates normal morphology and signal characteristics.  Cauda equina is normal. Subcentimeter bilateral S2 Tarlov cysts. Subcentimeter included thoracic perineural cyst. PARASPINAL AND SOFT TISSUES: Included prevertebral and paraspinal soft tissues are nonacute. Aortoiliac endovascular stent graft better characterized on prior CT. Mild symmetric paraspinal muscle atrophy. Heterogeneous uterus, possible leiomyomata. DISC LEVELS: L1-2: Annular bulging asymmetric to the RIGHT. No canal stenosis or neural foraminal narrowing. L2-3: No disc bulge, canal stenosis nor neural foraminal narrowing. Mild facet arthropathy. L3-4: Annular bulging. Moderate LEFT facet arthropathy and ligamentum flavum redundancy. Mild canal stenosis, no neural foraminal narrowing. L4-5: Annular bulging. Moderate LEFT greater than RIGHT facet arthropathy and ligamentum flavum redundancy resulting in mild canal stenosis, no neural foraminal narrowing. L5-S1: No disc bulge. Mild RIGHT, severe LEFT facet arthropathy. No canal stenosis or neural foraminal narrowing. IMPRESSION: Less than expected for age degenerative change of the lumbar spine. Mild canal stenosis L3-4 and L4-5. No neural foraminal narrowing. Electronically Signed   By: Elon Alas M.D.   On: 07/14/2016 14:54   Dg Chest Port 1 View  Result Date: 07/14/2016 CLINICAL DATA:  Patient coughing up blood this AM. Hx of AFIB. EXAM: PORTABLE CHEST 1 VIEW COMPARISON:  07/07/2016 FINDINGS: The heart is enlarged and stable in configuration. There has been development of bilateral upper lobe airspace filling opacities. Findings are consistent with hemorrhage or infectious infiltrate. The appearance is atypical for edema. IMPRESSION: New bilateral upper lobe airspace filling opacities consistent with hemorrhage and/or infection. Stable cardiomegaly. Electronically Signed   By: Nolon Nations M.D.   On: 07/14/2016 12:45      TODAY-DAY OF DISCHARGE:  Subjective:   Katie [name] today has no headache,no chest abdominal pain,no new  weakness tingling or numbness, feels much better wants to go home today  Objective:   Blood pressure 108/60, pulse 89, temperature 97.9 F (36.6 C), temperature source Oral, resp. rate 17, height 5\' 3"  (1.6 m), weight 89.8 kg (198 lb), SpO2 94 %.  Intake/Output Summary (Last 24 hours) at 07/16/16 1109 Last data filed at 07/16/16 1036  Gross per 24 hour  Intake              480 ml  Output             1901 ml  Net            -1421 ml   Filed Weights   07/14/16 0616 07/15/16 0551 07/16/16 0718  Weight: 92.9 kg (204 lb 14.4 oz) 90.9 kg (200 lb 8 oz) 89.8 kg (198 lb)    Exam: Awake Alert, Oriented *3, No new F.N deficits, Normal affect [place].AT,PERRAL Supple Neck,No JVD, No cervical lymphadenopathy appriciated.  Symmetrical Chest wall movement, Good air movement bilaterally, CTAB RRR,No Gallops,Rubs or new Murmurs, No Parasternal Heave +ve B.Sounds, Abd Soft, Non tender, No organomegaly appriciated, No rebound -guarding or rigidity. No Cyanosis, Clubbing or edema, No new Rash or bruise   PERTINENT RADIOLOGIC STUDIES: Dg Chest 2 View  Result Date: 07/07/2016 CLINICAL DATA:  Mid chest pain, dizziness, low blood pressure chest x-ray 411 1,018 EXAM: CHEST  2 VIEW COMPARISON:  None. FINDINGS: The lungs remain clear and somewhat hyperaerated. Mediastinal and hilar contours are unremarkable. Mild cardiomegaly is stable. No acute bony abnormality is seen. IMPRESSION: Stable cardiomegaly and hyperaeration.  No active lung disease. Electronically Signed   By: Ivar Drape M.D.   On: 07/07/2016 13:41   Dg Chest 2 View  Result Date: 06/23/2016 CLINICAL DATA:  Dyspnea EXAM: CHEST  2 VIEW COMPARISON:  06/01/2016 chest radiograph. FINDINGS: Partially visualized abdominal aortic stent graft in the upper abdominal aorta. Stable cardiomediastinal silhouette with mild cardiomegaly and aortic atherosclerosis. No pneumothorax. No pleural effusion. Emphysema. Borderline lung hyperinflation. No pulmonary edema.  No acute consolidative airspace disease. IMPRESSION: 1. Emphysema and borderline lung hyperinflation, which may indicate COPD. No acute pulmonary disease. 2. Stable cardiomegaly without pulmonary edema. 3. Aortic atherosclerosis. Electronically Signed   By: Ilona Sorrel M.D.   On: 06/23/2016 10:31   Mr Brain Wo Contrast  Result Date: 07/07/2016 CLINICAL DATA:  Encephalopathy. Word-finding difficulties for a few months, more forgetful. History of atrial fibrillation, migraine headache, pulmonary fibrosis. EXAM: MRI HEAD WITHOUT CONTRAST TECHNIQUE: Multiplanar, multiecho pulse sequences of the brain and surrounding structures were obtained without intravenous contrast. GFR 22. COMPARISON:  CT HEAD January 12, 2014 FINDINGS: BRAIN: No reduced diffusion to suggest acute ischemia. No susceptibility artifact to suggest hemorrhage. 9 mm rounded focus of bright T2 and low T1 signal LEFT cerebellum with intermediate FLAIR signal and, T2 shine through. Patchy supratentorial white matter FLAIR T2 hyperintensities without midline shift or mass effect. Ventricles and sulci are normal for patient's age. No abnormal extra-axial fluid collections. VASCULAR: Normal major intracranial vascular flow voids present at skull base. SKULL AND UPPER CERVICAL SPINE: No abnormal sellar expansion. No suspicious calvarial bone marrow signal. Craniocervical junction maintained. SINUSES/ORBITS: The mastoid air-cells and included paranasal sinuses are well-aerated. The included ocular globes and orbital contents are non-suspicious. Status post bilateral ocular lens implants. OTHER: Patient is edentulous. IMPRESSION: 9 mm LEFT cerebellar focus of apparent demyelination though, atypical for a patient of this age. Findings less likely represent subacute infarct. Given patient's GFR 22, consider consultation with nephrology prior to contrast administration. Recommend follow-up MRI of the brain in 6-8 weeks, with contrast if able. Moderate chronic  small vessel ischemic disease. Electronically Signed   By: Elon Alas M.D.   On: 07/07/2016 19:09   Mr Cervical Spine Wo Contrast  Result Date: 07/08/2016 CLINICAL DATA:  Follow-up abnormal brain MRI. A few months of word-finding difficulties and forgetfulness. History of migraine headaches and atrial fibrillation. Possible LEFT cerebellar demyelination. Assess for spinal demyelination. EXAM: MRI CERVICAL, THORACIC SPINE WITHOUT CONTRAST TECHNIQUE: Multiplanar and multiecho pulse sequences of the cervical spine, to include the craniocervical junction and cervicothoracic junction, and thoracic spine, were obtained without intravenous contrast. COMPARISON:  MRI of the head July 07, 2016 FINDINGS: MRI CERVICAL SPINE FINDINGS ALIGNMENT: Straightened cervical lordosis.  No malalignment. VERTEBRAE/DISCS: Vertebral bodies are intact. Moderate C4-5 and C5-6 disc height loss, mild at C6-7 with mild desiccation and mild subacute on chronic discogenic endplate changes. Minimal chronic discogenic endplate changes G4-0. No abnormal or acute bone marrow signal. CORD:Cervical spinal cord is normal morphology and signal characteristics from the cervicomedullary junction to level of T1-2, the most caudal well visualized level. POSTERIOR FOSSA, VERTEBRAL ARTERIES, PARASPINAL TISSUES: No MR findings of ligamentous injury. Vertebral artery flow voids present. Included posterior fossa and paraspinal soft tissues are normal. DISC LEVELS (mildly motion degraded axial sequences): C2-3: Uncovertebral hypertrophy and mild facet arthropathy without canal stenosis or neural foraminal narrowing. C3-4: Annular bulging, uncovertebral hypertrophy. Moderate facet arthropathy without canal stenosis or neural foraminal narrowing. C4-5: Small broad-based disc bulge, uncovertebral hypertrophy mild facet arthropathy. Mild canal stenosis. Moderate neural foraminal narrowing. C5-6: Small broad-based disc bulge, uncovertebral hypertrophy. Mild  canal stenosis. Moderate neural foraminal narrowing. C6-7: Small broad-based disc bulge, uncovertebral hypertrophy. No canal stenosis. Moderate LEFT  neural foraminal narrowing. Subcentimeter RIGHT C7 perineural cyst. C7-T1: No disc bulge, canal stenosis nor neural foraminal narrowing. Moderate to severe LEFT facet arthropathy. Subcentimeter C8 perineural cysts. MRI THORACIC SPINE FINDINGS ALIGNMENT: Maintenance of the thoracic kyphosis. No malalignment. VERTEBRAE/DISCS: Vertebral bodies are intact. Mild C4-5 and C5-6 disc height loss, disc desiccation all thoracic disc compatible with degenerative discs. Mild subacute on chronic discogenic endplate changes J4-9. No abnormal or acute bone marrow signal. Scattered old Schmorl's nodes. Scattered hemangiomata. CORD: Thoracic spinal cord is normal morphology and signal characteristics to the level of the conus medullaris which terminates at T12-L1. Ventral spinal cord deformity at T5-6 as described below. Multilevel subcentimeter perineural cysts. PREVERTEBRAL AND PARASPINAL SOFT TISSUES: Similar are mild subcarinal lymphadenopathy. Ectatic thoracic aorta. DISC LEVELS: T4-5: Small RIGHT central disc protrusion without canal stenosis or neural foraminal narrowing. T5-6: 5 x 9 x 11 mm RIGHT central disc extrusion, calcified on prior CT results and mild canal stenosis and ventral cord deformity. No neural foraminal narrowing. T6-7: Small central disc protrusion without canal stenosis or neural foraminal narrowing. T10-11: Tiny LEFT central disc protrusion. No disc bulge, canal stenosis nor neural foraminal narrowing at any remaining level. IMPRESSION: MRI CERVICAL SPINE: No findings of demyelination, contrast enhanced sequences are more sensitive for acute demyelination. 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 Moderate C4-5 through C6-7 neural foraminal narrowing. MRI THORACIC SPINE: No findings of demyelination, contrast enhanced  sequences are more sensitive for acute demyelination. Re- demonstration of 5 x 9 x 11 mm T5-6 calcified extrusion resulting in mild canal stenosis. Electronically Signed   By: Elon Alas M.D.   On: 07/08/2016 20:59   Mr Thoracic Spine Wo Contrast  Result Date: 07/08/2016 CLINICAL DATA:  Follow-up abnormal brain MRI. A few months of word-finding difficulties and forgetfulness. History of migraine headaches and atrial fibrillation. Possible LEFT cerebellar demyelination. Assess for spinal demyelination. EXAM: MRI CERVICAL, THORACIC SPINE WITHOUT CONTRAST TECHNIQUE: Multiplanar and multiecho pulse sequences of the cervical spine, to include the craniocervical junction and cervicothoracic junction, and thoracic spine, were obtained without intravenous contrast. COMPARISON:  MRI of the head July 07, 2016 FINDINGS: MRI CERVICAL SPINE FINDINGS ALIGNMENT: Straightened cervical lordosis.  No malalignment. VERTEBRAE/DISCS: Vertebral bodies are intact. Moderate C4-5 and C5-6 disc height loss, mild at C6-7 with mild desiccation and mild subacute on chronic discogenic endplate changes. Minimal chronic discogenic endplate changes F0-2. No abnormal or acute bone marrow signal. CORD:Cervical spinal cord is normal morphology and signal characteristics from the cervicomedullary junction to level of T1-2, the most caudal well visualized level. POSTERIOR FOSSA, VERTEBRAL ARTERIES, PARASPINAL TISSUES: No MR findings of ligamentous injury. Vertebral artery flow voids present. Included posterior fossa and paraspinal soft tissues are normal. DISC LEVELS (mildly motion degraded axial sequences): C2-3: Uncovertebral hypertrophy and mild facet arthropathy without canal stenosis or neural foraminal narrowing. C3-4: Annular bulging, uncovertebral hypertrophy. Moderate facet arthropathy without canal stenosis or neural foraminal narrowing. C4-5: Small broad-based disc bulge, uncovertebral hypertrophy mild facet arthropathy. Mild  canal stenosis. Moderate neural foraminal narrowing. C5-6: Small broad-based disc bulge, uncovertebral hypertrophy. Mild canal stenosis. Moderate neural foraminal narrowing. C6-7: Small broad-based disc bulge, uncovertebral hypertrophy. No canal stenosis. Moderate LEFT neural foraminal narrowing. Subcentimeter RIGHT C7 perineural cyst. C7-T1: No disc bulge, canal stenosis nor neural foraminal narrowing. Moderate to severe LEFT facet arthropathy. Subcentimeter C8 perineural cysts. MRI THORACIC SPINE FINDINGS ALIGNMENT: Maintenance of the thoracic kyphosis. No malalignment. VERTEBRAE/DISCS: Vertebral bodies are intact. Mild C4-5 and C5-6 disc height  loss, disc desiccation all thoracic disc compatible with degenerative discs. Mild subacute on chronic discogenic endplate changes T0-1. No abnormal or acute bone marrow signal. Scattered old Schmorl's nodes. Scattered hemangiomata. CORD: Thoracic spinal cord is normal morphology and signal characteristics to the level of the conus medullaris which terminates at T12-L1. Ventral spinal cord deformity at T5-6 as described below. Multilevel subcentimeter perineural cysts. PREVERTEBRAL AND PARASPINAL SOFT TISSUES: Similar are mild subcarinal lymphadenopathy. Ectatic thoracic aorta. DISC LEVELS: T4-5: Small RIGHT central disc protrusion without canal stenosis or neural foraminal narrowing. T5-6: 5 x 9 x 11 mm RIGHT central disc extrusion, calcified on prior CT results and mild canal stenosis and ventral cord deformity. No neural foraminal narrowing. T6-7: Small central disc protrusion without canal stenosis or neural foraminal narrowing. T10-11: Tiny LEFT central disc protrusion. No disc bulge, canal stenosis nor neural foraminal narrowing at any remaining level. IMPRESSION: MRI CERVICAL SPINE: No findings of demyelination, contrast enhanced sequences are more sensitive for acute demyelination. 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 Moderate  C4-5 through C6-7 neural foraminal narrowing. MRI THORACIC SPINE: No findings of demyelination, contrast enhanced sequences are more sensitive for acute demyelination. Re- demonstration of 5 x 9 x 11 mm T5-6 calcified extrusion resulting in mild canal stenosis. Electronically Signed   By: Elon Alas M.D.   On: 07/08/2016 20:59   Mr Lumbar Spine Wo Contrast  Result Date: 07/14/2016 CLINICAL DATA:  Bilateral lower extremity neuropathic pain and weakness. EXAM: MRI LUMBAR SPINE WITHOUT CONTRAST TECHNIQUE: Multiplanar, multisequence MR imaging of the lumbar spine was performed. No intravenous contrast was administered. COMPARISON:  CT abdomen and pelvis April 22, 2015 FINDINGS: SEGMENTATION: For the purposes of this report, the last well-formed intervertebral disc will be described as L5-S1. ALIGNMENT: Maintenance of the lumbar lordosis. No malalignment. VERTEBRAE:Vertebral bodies are intact. Intervertebral discs demonstrate normal morphology, decreased T2 signal within the included thoracic and lumbar discs L4-5 compatible with mild desiccation. Multilevel minimal chronic discogenic endplate changes, less than expected. No abnormal or acute bone marrow signal. CONUS MEDULLARIS: Conus medullaris terminates at T12-L1 and demonstrates normal morphology and signal characteristics. Cauda equina is normal. Subcentimeter bilateral S2 Tarlov cysts. Subcentimeter included thoracic perineural cyst. PARASPINAL AND SOFT TISSUES: Included prevertebral and paraspinal soft tissues are nonacute. Aortoiliac endovascular stent graft better characterized on prior CT. Mild symmetric paraspinal muscle atrophy. Heterogeneous uterus, possible leiomyomata. DISC LEVELS: L1-2: Annular bulging asymmetric to the RIGHT. No canal stenosis or neural foraminal narrowing. L2-3: No disc bulge, canal stenosis nor neural foraminal narrowing. Mild facet arthropathy. L3-4: Annular bulging. Moderate LEFT facet arthropathy and ligamentum flavum  redundancy. Mild canal stenosis, no neural foraminal narrowing. L4-5: Annular bulging. Moderate LEFT greater than RIGHT facet arthropathy and ligamentum flavum redundancy resulting in mild canal stenosis, no neural foraminal narrowing. L5-S1: No disc bulge. Mild RIGHT, severe LEFT facet arthropathy. No canal stenosis or neural foraminal narrowing. IMPRESSION: Less than expected for age degenerative change of the lumbar spine. Mild canal stenosis L3-4 and L4-5. No neural foraminal narrowing. Electronically Signed   By: Elon Alas M.D.   On: 07/14/2016 14:54   Dg Chest Port 1 View  Result Date: 07/14/2016 CLINICAL DATA:  Patient coughing up blood this AM. Hx of AFIB. EXAM: PORTABLE CHEST 1 VIEW COMPARISON:  07/07/2016 FINDINGS: The heart is enlarged and stable in configuration. There has been development of bilateral upper lobe airspace filling opacities. Findings are consistent with hemorrhage or infectious infiltrate. The appearance is atypical for edema.  IMPRESSION: New bilateral upper lobe airspace filling opacities consistent with hemorrhage and/or infection. Stable cardiomegaly. Electronically Signed   By: Nolon Nations M.D.   On: 07/14/2016 12:45     PERTINENT LAB RESULTS: CBC:  Recent Labs  07/15/16 0347 07/16/16 0342  WBC 8.4 7.2  HGB 11.7* 12.5  HCT 36.7 38.1  PLT 200 214   CMET CMP     Component Value Date/Time   NA 137 07/16/2016 0342   K 3.7 07/16/2016 0342   CL 102 07/16/2016 0342   CO2 23 07/16/2016 0342   GLUCOSE 82 07/16/2016 0342   BUN 14 07/16/2016 0342   CREATININE 1.42 (H) 07/16/2016 0342   CREATININE 1.66 (H) 06/23/2016 1144   CALCIUM 8.5 (L) 07/16/2016 0342   PROT 5.7 (L) 07/11/2016 0402   ALBUMIN 2.8 (L) 07/12/2016 0526   AST 25 07/11/2016 0402   ALT 22 07/11/2016 0402   ALKPHOS 33 (L) 07/11/2016 0402   BILITOT 1.1 07/11/2016 0402   GFRNONAA 35 (L) 07/16/2016 0342   GFRNONAA 30 (L) 06/23/2016 1144   GFRAA 41 (L) 07/16/2016 0342   GFRAA 35 (L)  06/23/2016 1144    GFR Estimated Creatinine Clearance: 37 mL/min (A) (by C-G formula based on SCr of 1.42 mg/dL (H)). No results for input(s): LIPASE, AMYLASE in the last 72 hours. No results for input(s): CKTOTAL, CKMB, CKMBINDEX, TROPONINI in the last 72 hours. Invalid input(s): POCBNP No results for input(s): DDIMER in the last 72 hours. No results for input(s): HGBA1C in the last 72 hours. No results for input(s): CHOL, HDL, LDLCALC, TRIG, CHOLHDL, LDLDIRECT in the last 72 hours. No results for input(s): TSH, T4TOTAL, T3FREE, THYROIDAB in the last 72 hours.  Invalid input(s): FREET3 No results for input(s): VITAMINB12, FOLATE, FERRITIN, TIBC, IRON, RETICCTPCT in the last 72 hours. Coags:  Recent Labs  07/15/16 0347 07/16/16 0342  INR 1.40 1.34   Microbiology: Recent Results (from the past 240 hour(s))  MRSA PCR Screening     Status: None   Collection Time: 07/07/16 10:23 PM  Result Value Ref Range Status   MRSA by PCR NEGATIVE NEGATIVE Final    Comment:        The GeneXpert MRSA Assay (FDA approved for NASAL specimens only), is one component of a comprehensive MRSA colonization surveillance program. It is not intended to diagnose MRSA infection nor to guide or monitor treatment for MRSA infections.     FURTHER DISCHARGE INSTRUCTIONS:  Get Medicines reviewed and adjusted: Please take all your medications with you for your next visit with your Primary MD  Laboratory/radiological data: Please request your Primary MD to go over all hospital tests and procedure/radiological results at the follow up, please ask your Primary MD to get all Hospital records sent to his/her office.  In some cases, they will be blood work, cultures and biopsy results pending at the time of your discharge. Please request that your primary care M.D. goes through all the records of your hospital data and follows up on these results.  Also Note the following: If you experience worsening of  your admission symptoms, develop shortness of breath, life threatening emergency, suicidal or homicidal thoughts you must seek medical attention immediately by calling 911 or calling your MD immediately  if symptoms less severe.  You must read complete instructions/literature along with all the possible adverse reactions/side effects for all the Medicines you take and that have been prescribed to you. Take any new Medicines after you have completely understood and accpet  all the possible adverse reactions/side effects.   Do not drive when taking Pain medications or sleeping medications (Benzodaizepines)  Do not take more than prescribed Pain, Sleep and Anxiety Medications. It is not advisable to combine anxiety,sleep and pain medications without talking with your primary care practitioner  Special Instructions: If you have smoked or chewed Tobacco  in the last 2 yrs please stop smoking, stop any regular Alcohol  and or any Recreational drug use.  Wear Seat belts while driving.  Please note: You were cared for by a hospitalist during your hospital stay. Once you are discharged, your primary care physician will handle any further medical issues. Please note that NO REFILLS for any discharge medications will be authorized once you are discharged, as it is imperative that you return to your primary care physician (or establish a relationship with a primary care physician if you do not have one) for your post hospital discharge needs so that they can reassess your need for medications and monitor your lab values.  Total Time spent coordinating discharge including counseling, education and face to face time equals 45 minutes.  Signed: Shanker Ghimire 07/16/2016 11:09 AM

## 2016-07-16 NOTE — Progress Notes (Signed)
Progress Note  Patient Name: Katie [name] Date of Encounter: 07/16/2016  Primary Cardiologist: Lovena Le  Subjective   No dyspnea. No chest pain  Inpatient Medications    Scheduled Meds: . allopurinol  150 mg Oral Daily  . aspirin EC  81 mg Oral Daily  . budesonide (PULMICORT) nebulizer solution  0.25 mg Nebulization BID  . digoxin  0.0625 mg Oral Daily  . enoxaparin (LOVENOX) injection  1 mg/kg Subcutaneous Q12H  . furosemide  40 mg Intravenous BID  . gabapentin  300 mg Oral TID  . levothyroxine  50 mcg Oral QAC breakfast  . metoprolol tartrate  12.5 mg Oral BID  . montelukast  10 mg Oral QHS  . pantoprazole  40 mg Oral Daily  . pravastatin  40 mg Oral q1800  . umeclidinium-vilanterol  1 puff Inhalation Daily  . warfarin  3 mg Oral ONCE-1800  . Warfarin - Pharmacist Dosing Inpatient   Does not apply q1800   Continuous Infusions:  PRN Meds: acetaminophen **OR** acetaminophen, ALPRAZolam, alum & mag hydroxide-simeth, antiseptic oral rinse, HYDROcodone-acetaminophen, hydroxypropyl methylcellulose / hypromellose, ipratropium-albuterol, ondansetron **OR** ondansetron (ZOFRAN) IV, polyethylene glycol   Vital Signs    Vitals:   07/15/16 1233 07/15/16 1948 07/15/16 1950 07/16/16 0718  BP: 125/67  (!) 103/52 108/60  Pulse: 85  90 89  Resp: 16  17 17   Temp: 97.4 F (36.3 C)  97.5 F (36.4 C) 97.9 F (36.6 C)  TempSrc: Oral  Oral Oral  SpO2: 94% 95% 96% 97%  Weight:    198 lb (89.8 kg)  Height:        Intake/Output Summary (Last 24 hours) at 07/16/16 1015 Last data filed at 07/16/16 0700  Gross per 24 hour  Intake              480 ml  Output             1901 ml  Net            -1421 ml   Filed Weights   07/14/16 0616 07/15/16 0551 07/16/16 0718  Weight: 204 lb 14.4 oz (92.9 kg) 200 lb 8 oz (90.9 kg) 198 lb (89.8 kg)    Telemetry     atrial fib- Personally Reviewed  ECG    N/A - Personally Reviewed  Physical Exam  .  General: Well developed, well  nourished, NAD  HEENT: OP clear, mucus membranes moist  SKIN: warm, dry. No rashes. Neuro: No focal deficits  Musculoskeletal: Muscle strength 5/5 all ext  Psychiatric: Mood and affect normal  Neck: No JVD, no carotid bruits, no thyromegaly, no lymphadenopathy.  Lungs:Clear bilaterally, no wheezes, rhonci, crackles Cardiovascular: Irreg irreg with soft systolic murmur noted.  Abdomen:Soft. Bowel sounds present. Non-tender.  Extremities: No lower extremity edema.    Labs    Chemistry Recent Labs Lab 07/10/16 0228 07/11/16 0402 07/12/16 0526  07/14/16 0510 07/15/16 0956 07/16/16 0342  NA 135 135 134*  < > 137 136 137  K 3.7 4.5 4.1  < > 3.7 3.7 3.7  CL 99* 99* 101  < > 103 102 102  CO2 30 27 23   < > 25 26 23   GLUCOSE 108* 92 84  < > 79 84 82  BUN 21* 11 11  < > 12 12 14   CREATININE 1.31* 1.18* 1.18*  < > 1.19* 1.41* 1.42*  CALCIUM 8.6* 8.8* 8.4*  < > 8.2* 8.6* 8.5*  PROT 5.1* 5.7*  --   --   --   --   --  ALBUMIN 2.9* 3.2* 2.8*  --   --   --   --   AST 22 25  --   --   --   --   --   ALT 19 22  --   --   --   --   --   ALKPHOS 31* 33*  --   --   --   --   --   BILITOT 0.7 1.1  --   --   --   --   --   GFRNONAA 39* 44* 44*  < > 44* 36* 35*  GFRAA 45* 51* 51*  < > 51* 41* 41*  ANIONGAP 6 9 10   < > 9 8 12   < > = values in this interval not displayed.   Hematology  Recent Labs Lab 07/14/16 0510 07/15/16 0347 07/16/16 0342  WBC 7.4 8.4 7.2  RBC 3.86* 3.97 4.15  HGB 11.6* 11.7* 12.5  HCT 35.5* 36.7 38.1  MCV 92.0 92.4 91.8  MCH 30.1 29.5 30.1  MCHC 32.7 31.9 32.8  RDW 16.0* 16.0* 15.9*  PLT 194 200 214    Cardiac Enzymes  Recent Labs Lab 07/11/16 0402  TROPONINI 0.07*   No results for input(s): TROPIPOC in the last 168 hours.   BNPNo results for input(s): BNP, PROBNP in the last 168 hours.   DDimer No results for input(s): DDIMER in the last 168 hours.    Radiology    Mr Lumbar Spine Wo Contrast  Result Date: 07/14/2016 CLINICAL DATA:  Bilateral  lower extremity neuropathic pain and weakness. EXAM: MRI LUMBAR SPINE WITHOUT CONTRAST TECHNIQUE: Multiplanar, multisequence MR imaging of the lumbar spine was performed. No intravenous contrast was administered. COMPARISON:  CT abdomen and pelvis April 22, 2015 FINDINGS: SEGMENTATION: For the purposes of this report, the last well-formed intervertebral disc will be described as L5-S1. ALIGNMENT: Maintenance of the lumbar lordosis. No malalignment. VERTEBRAE:Vertebral bodies are intact. Intervertebral discs demonstrate normal morphology, decreased T2 signal within the included thoracic and lumbar discs L4-5 compatible with mild desiccation. Multilevel minimal chronic discogenic endplate changes, less than expected. No abnormal or acute bone marrow signal. CONUS MEDULLARIS: Conus medullaris terminates at T12-L1 and demonstrates normal morphology and signal characteristics. Cauda equina is normal. Subcentimeter bilateral S2 Tarlov cysts. Subcentimeter included thoracic perineural cyst. PARASPINAL AND SOFT TISSUES: Included prevertebral and paraspinal soft tissues are nonacute. Aortoiliac endovascular stent graft better characterized on prior CT. Mild symmetric paraspinal muscle atrophy. Heterogeneous uterus, possible leiomyomata. DISC LEVELS: L1-2: Annular bulging asymmetric to the RIGHT. No canal stenosis or neural foraminal narrowing. L2-3: No disc bulge, canal stenosis nor neural foraminal narrowing. Mild facet arthropathy. L3-4: Annular bulging. Moderate LEFT facet arthropathy and ligamentum flavum redundancy. Mild canal stenosis, no neural foraminal narrowing. L4-5: Annular bulging. Moderate LEFT greater than RIGHT facet arthropathy and ligamentum flavum redundancy resulting in mild canal stenosis, no neural foraminal narrowing. L5-S1: No disc bulge. Mild RIGHT, severe LEFT facet arthropathy. No canal stenosis or neural foraminal narrowing. IMPRESSION: Less than expected for age degenerative change of the  lumbar spine. Mild canal stenosis L3-4 and L4-5. No neural foraminal narrowing. Electronically Signed   By: Elon Alas M.D.   On: 07/14/2016 14:54   Dg Chest Port 1 View  Result Date: 07/14/2016 CLINICAL DATA:  Patient coughing up blood this AM. Hx of AFIB. EXAM: PORTABLE CHEST 1 VIEW COMPARISON:  07/07/2016 FINDINGS: The heart is enlarged and stable in configuration. There has been development of bilateral upper lobe airspace  filling opacities. Findings are consistent with hemorrhage or infectious infiltrate. The appearance is atypical for edema. IMPRESSION: New bilateral upper lobe airspace filling opacities consistent with hemorrhage and/or infection. Stable cardiomegaly. Electronically Signed   By: Nolon Nations M.D.   On: 07/14/2016 12:45    Cardiac Studies   TTE: 07/10/16  Study Conclusions  - Left ventricle: inferior wall hypokinesis. Systolic function was mildly reduced. The estimated ejection fraction was in the range of 45% to 50%. - Aortic valve: There was mild regurgitation. Valve area (VTI): 1.53 cm^2. Valve area (Vmax): 1.58 cm^2. Valve area (Vmean): 1.57 cm^2. - Mitral valve: There was mild regurgitation. - Left atrium: The atrium was mildly dilated. - Right atrium: The atrium was mildly dilated. - Atrial septum: No defect or patent foramen ovale was identified. - Tricuspid valve: There was moderate regurgitation. - Pericardium, extracardiac: A trivial pericardial effusion was identified posterior to the heart.  Patient Profile     76 y.o. female  PMH of CAD, diastolic HF, abd aortic aneurysm s/p repair, CKD, pulmonary fibrosis, permanent AF, HTN, COPDand GERD who presented with chest pain, and HF  Assessment & Plan    1. Acute systolic CHF: Volume status is better after diuresis. Her weight is down 14 lbs. She feels better. Will change to Lasix 40 mg po BID. She will need to follow her weights closely at home and take an extra lasix as needed for  weight gain  2. Chest pain:  Resolved. Likely related to her volume overload.   3. Permanent atrial fibrillation: Rate controlled. Continue coumadin. INR to be checked at [facility]    4. HTN: BP stable.    OK to d/c home. We will arrange f/u with Dr. Lovena Le 2 weeks after d/c.   Signed, Lauree Chandler, MD  07/16/2016, 10:15 AM

## 2016-07-16 NOTE — Progress Notes (Signed)
Pt slept well during the night, Vitals stable, no any sign of SOB and distress noted, Tylenol given for a complain of back pain, will continue to monitor the patient.

## 2016-07-16 NOTE — Progress Notes (Signed)
ANTICOAGULATION CONSULT NOTE - Follow-Up  Pharmacy Consult for warfarin Indication: atrial fibrillation  Patient Measurements: Height: 5\' 3"  (160 cm) Weight: 200 lb 8 oz (90.9 kg) IBW/kg (Calculated) : 52.4  Vital Signs: Temp: 97.5 F (36.4 C) (05/03 1950) Temp Source: Oral (05/03 1950) BP: 103/52 (05/03 1950) Pulse Rate: 90 (05/03 1950)  Labs:  Recent Labs  07/14/16 0510 07/14/16 1611 07/15/16 0347 07/15/16 0956 07/16/16 0342  HGB 11.6*  --  11.7*  --  12.5  HCT 35.5*  --  36.7  --  38.1  PLT 194  --  200  --  214  APTT  --  54*  --   --   --   LABPROT 18.7*  --  17.3*  --  16.7*  INR 1.55  --  1.40  --  1.34  CREATININE 1.19*  --   --  1.41* 1.42*    Estimated Creatinine Clearance: 37.2 mL/min (A) (by C-G formula based on SCr of 1.42 mg/dL (H)).   Assessment: 76 yo female on warfarin PTA for atrial fibrillation admitted 4/25 with CP and confusion. Admit INR 3.91 with last dose of warfarin taken 4/25.  INR decreased from peak of 5.45 to 1.91 following 1 mg vitamin K on 4/27. Levothyroxine dose was increased this admit which may increase the INR.  INR down to 1.34 today. Warfarin was restarted on 5/3 PM. Whle INR remains SUBtherapeutic, pharmacy consulted to dose lovenox.  Patient did cough up blood clots on 5/2, but CBC has remained stable. GI not planning any further workup at this time. Cardiology okay with starting warfarin.  Home dose: 3 mg daily  Goal of Therapy:  Monitor platelets by anticoagulation protocol: Yes   Plan:  - Warfarin 3mg  x1 tonight - Daily INR - Will continue to monitor CBC at least q72h on Lovenox, signs/symptoms of bleeding  Myer Peer Grayland Ormond), PharmD  PGY1 Pharmacy Resident Pager: [phone] 07/16/2016 6:44 AM

## 2016-07-16 NOTE — Clinical Social Work Note (Signed)
CSW facilitated patient discharge including contacting patient family and facility to confirm patient discharge plans. Clinical information faxed to facility and family agreeable with plan. Patient's daughter will transport by car to [employer] RN to call report prior to discharge [phone]).  CSW will sign off for now as social work intervention is no longer needed. Please consult [place] again if new needs arise.  Dayton Scrape, [place]

## 2016-07-16 NOTE — Progress Notes (Signed)
Call placed to [facility] to notify of telemetry monitoring d/c.  Pt is c/a/ox3, awaiting her application process/representative from [facility], as well as her daughter for transport.

## 2016-07-16 NOTE — Progress Notes (Signed)
Pt escorted by family member off the floor to see her other daughter in ICU. Pt and daughter at bedside notified of 20 minute restriction for time off unit, also that if pt has questions regarding her discharge she will not be chased down to discuss or relay information. Pt informed that as of now, she must wait here until 2pm for [facility] staff to arrive to complete the application. Pt states "I cant stay until 2pm I have a hair appointment".

## 2016-07-16 NOTE — Progress Notes (Signed)
Call placed to [facility].  Attempt to call report, Supervisor Letta Median stated they have all information they need from the discharge summary.

## 2016-07-16 NOTE — Clinical Social Work Note (Addendum)
CSW received voicemail from patient's daughter at 7:00 pm last night stating that they have decided to choose [facility]. Admissions coordinator notified. Awaiting response.  Dayton Scrape, [place] [phone]  11:23 am [facility] can accept patient today. Staff will be at hospital around 2:00 pm to do paperwork prior to patient leaving. Patient aware.  Dayton Scrape, [place]

## 2016-07-16 NOTE — Clinical Social Work Placement (Signed)
   CLINICAL SOCIAL WORK PLACEMENT  NOTE  Date:  07/16/2016  Patient Details  Name: Katie [name] MRN: [id number] Date of Birth: Mar 12, 1941  Clinical Social Work is seeking post-discharge placement for this patient at the [place] level of care (*CSW will initial, date and re-position this form in  chart as items are completed):  Yes   Patient/family provided with [place] Work Department's list of facilities offering this level of care within the geographic area requested by the patient (or if unable, by the patient's family).  Yes   Patient/family informed of their freedom to choose among providers that offer the needed level of care, that participate in Medicare, Medicaid or managed care program needed by the patient, have an available bed and are willing to accept the patient.  Yes   Patient/family informed of [place]'s ownership interest in [facility] and [facility], as well as of the fact that they are under no obligation to receive care at these facilities.  PASRR submitted to [employer] on 07/13/16     PASRR number received on 07/13/16     Existing PASRR number confirmed on       FL2 transmitted to all facilities in geographic area requested by pt/family on 07/13/16     FL2 transmitted to all facilities within larger geographic area on       Patient informed that his/her managed care company has contracts with or will negotiate with certain facilities, including the following:        Yes   Patient/family informed of bed offers received.  Patient chooses bed at [facility]     Physician recommends and patient chooses bed at      Patient to be transferred to [facility] on 07/16/16.  Patient to be transferred to facility by Daughter to drive     Patient family notified on 07/16/16 of transfer.  Name of family member notified:  [name]     PHYSICIAN Please prepare prescriptions     Additional Comment:     _______________________________________________ Candie Chroman, LCSW 07/16/2016, 12:37 PM

## 2016-07-19 ENCOUNTER — Encounter: Payer: Self-pay | Admitting: Internal Medicine

## 2016-07-19 ENCOUNTER — Non-Acute Institutional Stay (SKILLED_NURSING_FACILITY): Payer: Medicare Other | Admitting: Internal Medicine

## 2016-07-19 ENCOUNTER — Ambulatory Visit: Payer: Self-pay | Admitting: Internal Medicine

## 2016-07-19 DIAGNOSIS — E032 Hypothyroidism due to medicaments and other exogenous substances: Secondary | ICD-10-CM | POA: Diagnosis not present

## 2016-07-19 DIAGNOSIS — R5381 Other malaise: Secondary | ICD-10-CM | POA: Diagnosis not present

## 2016-07-19 DIAGNOSIS — N183 Chronic kidney disease, stage 3 unspecified: Secondary | ICD-10-CM

## 2016-07-19 DIAGNOSIS — J309 Allergic rhinitis, unspecified: Secondary | ICD-10-CM | POA: Diagnosis not present

## 2016-07-19 DIAGNOSIS — I482 Chronic atrial fibrillation, unspecified: Secondary | ICD-10-CM

## 2016-07-19 DIAGNOSIS — J449 Chronic obstructive pulmonary disease, unspecified: Secondary | ICD-10-CM | POA: Diagnosis not present

## 2016-07-19 DIAGNOSIS — R29898 Other symptoms and signs involving the musculoskeletal system: Secondary | ICD-10-CM

## 2016-07-19 DIAGNOSIS — R42 Dizziness and giddiness: Secondary | ICD-10-CM | POA: Diagnosis not present

## 2016-07-19 DIAGNOSIS — E782 Mixed hyperlipidemia: Secondary | ICD-10-CM

## 2016-07-19 DIAGNOSIS — M792 Neuralgia and neuritis, unspecified: Secondary | ICD-10-CM | POA: Diagnosis not present

## 2016-07-19 DIAGNOSIS — I5022 Chronic systolic (congestive) heart failure: Secondary | ICD-10-CM | POA: Diagnosis not present

## 2016-07-19 DIAGNOSIS — K219 Gastro-esophageal reflux disease without esophagitis: Secondary | ICD-10-CM

## 2016-07-19 NOTE — Progress Notes (Signed)
LOCATION: [facility]  PCP: [name], MD   Code Status: Full Code  Goals of care: Advanced Directive information Advanced Directives 07/07/2016  Does Patient Have a Medical Advance Directive? No  Type of Advance Directive -  Does patient want to make changes to medical advance directive? -  Copy of [place] in Chart? -  Would patient like information on creating a medical advance directive? No - Patient declined  Pre-existing out of facility DNR order (yellow form or pink MOST form) -       Extended Emergency Contact Information Primary Emergency Contact: Schnorr,Douglas Address: [place], [place] [address] [place] Phone: [phone] Mobile Phone: [phone] Relation: Spouse Secondary Emergency Contact: Wyrick,[name]  Johnnette Litter of [place] Phone: [phone] Mobile Phone: [phone] Relation: Daughter   Allergies  Allergen Reactions  . Amiodarone Other (See Comments)    PULMONARY TOXICITY  . Diovan [Valsartan] Other (See Comments)    HYPOTENSION  . Doxycycline Diarrhea and Other (See Comments)    VISUAL DISTURBANCE  . Flexeril [Cyclobenzaprine] Other (See Comments)    FATIGUE  . Keflex [Cephalexin] Diarrhea  . Verapamil Other (See Comments)    EDEMA  . Advair Diskus [Fluticasone-Salmeterol] Other (See Comments)    SKIN CHANGES.  Can tolerate low dose of prednisone without complications  . Codeine Other (See Comments)    unknown    Chief Complaint  Patient presents with  . New Admit To SNF    New Admission Visit      HPI:  Patient is a 76 y.o. female seen today for short term rehabilitation post hospital admission from 07/07/16-07/16/16 with acute encephalopathy. She underwent extensive workup with unclear etiology. She had MRI brain showing 9 mm left cerebellar demyelination and neurology was consulted. 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 She also had weakness  to RLE and had MRI spine that was unrevealing. She had hemoptysis and dysphagia. She was seen by pulmonology and GI along with SLP and no new recommendation was made. She also had acute on chronic systolic CHF and required diuresis. She has PMH of chronic diastolic CHF, afib, HTN among others. She is seen in her room today.    Review of Systems:  Constitutional: Negative for fever, chills, diaphoresis. Energy level is fair. HENT: Negative for headache, congestion,sore throat, difficulty swallowing. Positive for clear nasal discharge. Eyes: Negative for  blurred vision, double vision and discharge.  Respiratory: Negative for cough, shortness of breath and wheezing.   Cardiovascular: Negative for chest pain, palpitations, leg swelling.  Gastrointestinal: Negative for heartburn, nausea, vomiting, abdominal pain, loss of appetite. Last bowel movement was this morning. No blood.  Genitourinary: Negative for dysuria.  Musculoskeletal: Negative for fall in the facility. Positive for RLE weakness.  Skin: Negative for itching, rash.  Neurological: Occasional dizziness, taking prn medication. Psychiatric/Behavioral: Negative for depression   Past Medical History:  Diagnosis Date  . Atrial fibrillation ([place])   . Migraine headache   . Osteopenia    Past Surgical History:  Procedure Laterality Date  . ABDOMINAL AORTIC ENDOVASCULAR STENT GRAFT N/A 04/11/2013   Procedure: ABDOMINAL AORTIC ENDOVASCULAR STENT GRAFT WITH RIGHT FEMORAL PATCH ANGIOPLASTY;  Surgeon: Mal Misty, MD;  Location: [facility];  Service: Vascular;  Laterality: N/A;  . BREAST SURGERY     LEFT BREAST BIOPSY  . broken leg Left 1970s  . CARDIAC CATHETERIZATION    .  CHOLECYSTECTOMY  1972  . COLONOSCOPY    . EYE SURGERY Bilateral   . Laser vein procedure    . REFRACTIVE SURGERY    . TONSILLECTOMY     Social History:   reports that she quit smoking about 13 years ago. Her smoking use included Cigarettes. She has never used  smokeless tobacco. She reports that she drinks alcohol. She reports that she does not use drugs.  Family History  Problem Relation Age of Onset  . Cirrhosis Mother   . Cancer Mother 19    PANCREAS  . Heart defect Sister   . Breast cancer Sister     age 27  . Heart disease Sister   . Stroke Sister   . Alcohol abuse Father   . Depression Father   . Hypertension Brother   . Hyperlipidemia Son   . Heart disease Daughter     Medications: Allergies as of 07/19/2016      Reactions   Amiodarone Other (See Comments)   PULMONARY TOXICITY   Diovan [valsartan] Other (See Comments)   HYPOTENSION   Doxycycline Diarrhea, Other (See Comments)   VISUAL DISTURBANCE   Flexeril [cyclobenzaprine] Other (See Comments)   FATIGUE   Keflex [cephalexin] Diarrhea   Verapamil Other (See Comments)   EDEMA   Advair Diskus [fluticasone-salmeterol] Other (See Comments)   SKIN CHANGES.  Can tolerate low dose of prednisone without complications   Codeine Other (See Comments)   unknown      Medication List       Accurate as of 07/19/16  2:31 PM. Always use your most recent med list.          acetaminophen 500 MG tablet Commonly known as:  TYLENOL Take 1,000 mg by mouth 2 (two) times daily as needed for moderate pain.   albuterol 108 (90 Base) MCG/ACT inhaler Commonly known as:  PROVENTIL HFA;VENTOLIN HFA Inhale 2 puffs into the lungs every 4 (four) hours as needed for shortness of breath. Inhale 2 puffs into the lungs 5 minutes apart every 4 to 6 hours as needed to rescue asthma   allopurinol 300 MG tablet Commonly known as:  ZYLOPRIM TAKE 1 TABLET BY MOUTH EVERY DAY   ALPRAZolam 0.5 MG tablet Commonly known as:  XANAX Take 0.5 mg by mouth every 8 (eight) hours as needed for anxiety.   azelastine 0.1 % nasal spray Commonly known as:  ASTELIN Place 2 sprays into both nostrils 2 (two) times daily. Use in each nostril as directed   budesonide 0.25 MG/2ML nebulizer solution Commonly known  as:  PULMICORT Take 2 mLs (0.25 mg total) by nebulization 2 (two) times daily.   buPROPion 150 MG 24 hr tablet Commonly known as:  WELLBUTRIN XL Take 1 tablet (150 mg total) by mouth every morning.   cetirizine 10 MG tablet Commonly known as:  ZYRTEC TAKE 1 CAPSULE (10 MG TOTAL) BY MOUTH AT BEDTIME.   digoxin 0.125 MG tablet Commonly known as:  LANOXIN Take 0.0625 mg by mouth daily.   Fluticasone-Umeclidin-Vilant 100-62.5-25 MCG/INH Aepb Commonly known as:  TRELEGY ELLIPTA Inhale 1 puff into the lungs daily.   furosemide 40 MG tablet Commonly known as:  LASIX 40mg  twice daily   gabapentin 300 MG capsule Commonly known as:  NEURONTIN TAKE ONE CAPSULE BY MOUTH 3 TIMES A DAY   hydroxypropyl methylcellulose / hypromellose 2.5 % ophthalmic solution Commonly known as:  ISOPTO TEARS / GONIOVISC Place 1 drop into both eyes 4 (four) times daily as needed for  dry eyes.   levothyroxine 50 MCG tablet Commonly known as:  SYNTHROID, LEVOTHROID TAKE 1 TABLET EVERY DAY   meclizine 25 MG tablet Commonly known as:  ANTIVERT Take 1 tablet (25 mg total) by mouth 3 (three) times daily as needed for dizziness.   metoprolol tartrate 25 MG tablet Commonly known as:  LOPRESSOR Take 0.5 tablets (12.5 mg total) by mouth 2 (two) times daily.   montelukast 10 MG tablet Commonly known as:  SINGULAIR TAKE 1 TABLET EVERY DAY FOR ALLERGIES   pantoprazole 40 MG tablet Commonly known as:  PROTONIX Take 1 tablet (40 mg total) by mouth daily.   potassium chloride SA 20 MEQ tablet Commonly known as:  KLOR-CON M20 Take 2 tablets (40 mEq total) by mouth daily.   pravastatin 40 MG tablet Commonly known as:  PRAVACHOL TAKE 1 TABLET BY MOUTH AT BEDTIME FOR CHOLESTEROL   prochlorperazine 5 MG tablet Commonly known as:  COMPAZINE TAKE 1 TABLET BY MOUTH 3 TIMES A DAY FOR VERTIGO OR NAUSEA   umeclidinium-vilanterol 62.5-25 MCG/INH Aepb Commonly known as:  ANORO ELLIPTA Inhale 1 puff into the lungs  daily.   warfarin 3 MG tablet Commonly known as:  COUMADIN Take 3 mg by mouth daily.       Immunizations: Immunization History  Administered Date(s) Administered  . Influenza, High Dose Seasonal PF 12/20/2013, 12/09/2014, 11/14/2015  . Influenza-Unspecified 01/02/2013  . PPD Test 07/17/2016  . Pneumococcal Conjugate-13 01/23/2014  . Pneumococcal-Unspecified 03/15/1993, 05/31/2008  . Td 03/16/2007  . Varicella 02/19/2008     Physical Exam: Vitals:   07/19/16 1419  BP: 118/66  Pulse: 90  Resp: 18  Temp: 97.7 F (36.5 C)  TempSrc: Oral  SpO2: 94%  Weight: 197 lb (89.4 kg)  Height: 5\' 3"  (1.6 m)   Body mass index is 34.9 kg/m.  General- elderly female, obese, in no acute distress Head- normocephalic, atraumatic Nose- no nasal discharge Throat- moist mucus membrane, normal oropharynx, wears dentures Eyes- PERRLA, EOMI, no pallor, no icterus, no discharge, normal conjunctiva, normal sclera, watery eyes Neck- no cervical lymphadenopathy Cardiovascular- irregular heart rate, no murmur Respiratory- bilateral clear to auscultation, no wheeze, no rhonchi, no crackles, no use of accessory muscles Abdomen- bowel sounds present, soft, non tender, no guarding or rigidity Musculoskeletal- able to move all 4 extremities, generalized weakness to lower extremities, trace leg edema, varicose veins Neurological- alert and oriented to person, place and time Skin- warm and dry, easy bruising Psychiatry- normal mood and affect    Labs reviewed: Basic Metabolic Panel:  Recent Labs  03/01/16 1128  06/16/16 1354  07/09/16 0239  07/12/16 0526  07/14/16 0510 07/15/16 0956 07/16/16 0342  NA 140  < > 140  < > 133*  < > 134*  < > 137 136 137  K 4.4  < > 4.4  < > 3.8  < > 4.1  < > 3.7 3.7 3.7  CL 104  < > 101  < > 91*  < > 101  < > 103 102 102  CO2 18*  < > 22  < > 34*  < > 23  < > 25 26 23   GLUCOSE 136*  < > 85  < > 104*  < > 84  < > 79 84 82  BUN 16  < > 26*  < > 36*  < > 11   < > 12 12 14   CREATININE 1.32*  < > 1.34*  < > 1.59*  < > 1.18*  < >  1.19* 1.41* 1.42*  CALCIUM 9.4  < > 10.0  < > 8.9  < > 8.4*  < > 8.2* 8.6* 8.5*  MG 1.9  --  2.1  --  2.1  --   --   --   --   --   --   PHOS  --   --   --   --   --   --  2.3*  --   --   --   --   < > = values in this interval not displayed. Liver Function Tests:  Recent Labs  06/16/16 1354 07/10/16 0228 07/11/16 0402 07/12/16 0526  AST 18 22 25   --   ALT 17 19 22   --   ALKPHOS 42 31* 33*  --   BILITOT 0.5 0.7 1.1  --   PROT 6.2 5.1* 5.7*  --   ALBUMIN 3.8 2.9* 3.2* 2.8*   No results for input(s): LIPASE, AMYLASE in the last 8760 hours.  Recent Labs  07/07/16 1941  AMMONIA 26   CBC:  Recent Labs  03/01/16 1128  06/16/16 1354 06/23/16 1144  07/14/16 0510 07/15/16 0347 07/16/16 0342  WBC 9.4  < > 8.3 9.1  < > 7.4 8.4 7.2  NEUTROABS 5,828  --  5,229 5,733  --   --   --   --   HGB 14.5  < > 15.5 15.8*  < > 11.6* 11.7* 12.5  HCT 43.9  < > 46.7* 46.3*  < > 35.5* 36.7 38.1  MCV 97.6  < > 90.2 89.2  < > 92.0 92.4 91.8  PLT 232  < > 222 214  < > 194 200 214  < > = values in this interval not displayed. Cardiac Enzymes:  Recent Labs  07/07/16 2249 07/08/16 0559 07/11/16 0402  TROPONINI 0.08* 0.09* 0.07*   BNP: Invalid input(s): POCBNP CBG:  Recent Labs  12/25/15 1152  GLUCAP 145*    Radiological Exams: Dg Chest 2 View  Result Date: 07/07/2016 CLINICAL DATA:  Mid chest pain, dizziness, low blood pressure chest x-ray 411 1,018 EXAM: CHEST  2 VIEW COMPARISON:  None. FINDINGS: The lungs remain clear and somewhat hyperaerated. Mediastinal and hilar contours are unremarkable. Mild cardiomegaly is stable. No acute bony abnormality is seen. IMPRESSION: Stable cardiomegaly and hyperaeration.  No active lung disease. Electronically Signed   By: Ivar Drape M.D.   On: 07/07/2016 13:41   Dg Chest 2 View  Result Date: 06/23/2016 CLINICAL DATA:  Dyspnea EXAM: CHEST  2 VIEW COMPARISON:  06/01/2016  chest radiograph. FINDINGS: Partially visualized abdominal aortic stent graft in the upper abdominal aorta. Stable cardiomediastinal silhouette with mild cardiomegaly and aortic atherosclerosis. No pneumothorax. No pleural effusion. Emphysema. Borderline lung hyperinflation. No pulmonary edema. No acute consolidative airspace disease. IMPRESSION: 1. Emphysema and borderline lung hyperinflation, which may indicate COPD. No acute pulmonary disease. 2. Stable cardiomegaly without pulmonary edema. 3. Aortic atherosclerosis. Electronically Signed   By: Ilona Sorrel M.D.   On: 06/23/2016 10:31   Mr Brain Wo Contrast  Result Date: 07/07/2016 CLINICAL DATA:  Encephalopathy. Word-finding difficulties for a few months, more forgetful. History of atrial fibrillation, migraine headache, pulmonary fibrosis. EXAM: MRI HEAD WITHOUT CONTRAST TECHNIQUE: Multiplanar, multiecho pulse sequences of the brain and surrounding structures were obtained without intravenous contrast. GFR 22. COMPARISON:  CT HEAD January 12, 2014 FINDINGS: BRAIN: No reduced diffusion to suggest acute ischemia. No susceptibility artifact to suggest hemorrhage. 9 mm rounded focus of bright T2 and  low T1 signal LEFT cerebellum with intermediate FLAIR signal and, T2 shine through. Patchy supratentorial white matter FLAIR T2 hyperintensities without midline shift or mass effect. Ventricles and sulci are normal for patient's age. No abnormal extra-axial fluid collections. VASCULAR: Normal major intracranial vascular flow voids present at skull base. SKULL AND UPPER CERVICAL SPINE: No abnormal sellar expansion. No suspicious calvarial bone marrow signal. Craniocervical junction maintained. SINUSES/ORBITS: The mastoid air-cells and included paranasal sinuses are well-aerated. The included ocular globes and orbital contents are non-suspicious. Status post bilateral ocular lens implants. OTHER: Patient is edentulous. IMPRESSION: 9 mm LEFT cerebellar focus of  apparent demyelination though, atypical for a patient of this age. Findings less likely represent subacute infarct. Given patient's GFR 22, consider consultation with nephrology prior to contrast administration. Recommend follow-up MRI of the brain in 6-8 weeks, with contrast if able. Moderate chronic small vessel ischemic disease. Electronically Signed   By: Elon Alas M.D.   On: 07/07/2016 19:09   Mr Cervical Spine Wo Contrast  Result Date: 07/08/2016 CLINICAL DATA:  Follow-up abnormal brain MRI. A few months of word-finding difficulties and forgetfulness. History of migraine headaches and atrial fibrillation. Possible LEFT cerebellar demyelination. Assess for spinal demyelination. EXAM: MRI CERVICAL, THORACIC SPINE WITHOUT CONTRAST TECHNIQUE: Multiplanar and multiecho pulse sequences of the cervical spine, to include the craniocervical junction and cervicothoracic junction, and thoracic spine, were obtained without intravenous contrast. COMPARISON:  MRI of the head July 07, 2016 FINDINGS: MRI CERVICAL SPINE FINDINGS ALIGNMENT: Straightened cervical lordosis.  No malalignment. VERTEBRAE/DISCS: Vertebral bodies are intact. Moderate C4-5 and C5-6 disc height loss, mild at C6-7 with mild desiccation and mild subacute on chronic discogenic endplate changes. Minimal chronic discogenic endplate changes S5-0. No abnormal or acute bone marrow signal. CORD:Cervical spinal cord is normal morphology and signal characteristics from the cervicomedullary junction to level of T1-2, the most caudal well visualized level. POSTERIOR FOSSA, VERTEBRAL ARTERIES, PARASPINAL TISSUES: No MR findings of ligamentous injury. Vertebral artery flow voids present. Included posterior fossa and paraspinal soft tissues are normal. DISC LEVELS (mildly motion degraded axial sequences): C2-3: Uncovertebral hypertrophy and mild facet arthropathy without canal stenosis or neural foraminal narrowing. C3-4: Annular bulging, uncovertebral  hypertrophy. Moderate facet arthropathy without canal stenosis or neural foraminal narrowing. C4-5: Small broad-based disc bulge, uncovertebral hypertrophy mild facet arthropathy. Mild canal stenosis. Moderate neural foraminal narrowing. C5-6: Small broad-based disc bulge, uncovertebral hypertrophy. Mild canal stenosis. Moderate neural foraminal narrowing. C6-7: Small broad-based disc bulge, uncovertebral hypertrophy. No canal stenosis. Moderate LEFT neural foraminal narrowing. Subcentimeter RIGHT C7 perineural cyst. C7-T1: No disc bulge, canal stenosis nor neural foraminal narrowing. Moderate to severe LEFT facet arthropathy. Subcentimeter C8 perineural cysts. MRI THORACIC SPINE FINDINGS ALIGNMENT: Maintenance of the thoracic kyphosis. No malalignment. VERTEBRAE/DISCS: Vertebral bodies are intact. Mild C4-5 and C5-6 disc height loss, disc desiccation all thoracic disc compatible with degenerative discs. Mild subacute on chronic discogenic endplate changes N3-9. No abnormal or acute bone marrow signal. Scattered old Schmorl's nodes. Scattered hemangiomata. CORD: Thoracic spinal cord is normal morphology and signal characteristics to the level of the conus medullaris which terminates at T12-L1. Ventral spinal cord deformity at T5-6 as described below. Multilevel subcentimeter perineural cysts. PREVERTEBRAL AND PARASPINAL SOFT TISSUES: Similar are mild subcarinal lymphadenopathy. Ectatic thoracic aorta. DISC LEVELS: T4-5: Small RIGHT central disc protrusion without canal stenosis or neural foraminal narrowing. T5-6: 5 x 9 x 11 mm RIGHT central disc extrusion, calcified on prior CT results and mild canal stenosis and ventral cord deformity. No neural foraminal  narrowing. T6-7: Small central disc protrusion without canal stenosis or neural foraminal narrowing. T10-11: Tiny LEFT central disc protrusion. No disc bulge, canal stenosis nor neural foraminal narrowing at any remaining level. IMPRESSION: MRI CERVICAL SPINE: No  findings of demyelination, contrast enhanced sequences are more sensitive for acute demyelination. 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 Moderate C4-5 through C6-7 neural foraminal narrowing. MRI THORACIC SPINE: No findings of demyelination, contrast enhanced sequences are more sensitive for acute demyelination. Re- demonstration of 5 x 9 x 11 mm T5-6 calcified extrusion resulting in mild canal stenosis. Electronically Signed   By: Elon Alas M.D.   On: 07/08/2016 20:59   Mr Thoracic Spine Wo Contrast  Result Date: 07/08/2016 CLINICAL DATA:  Follow-up abnormal brain MRI. A few months of word-finding difficulties and forgetfulness. History of migraine headaches and atrial fibrillation. Possible LEFT cerebellar demyelination. Assess for spinal demyelination. EXAM: MRI CERVICAL, THORACIC SPINE WITHOUT CONTRAST TECHNIQUE: Multiplanar and multiecho pulse sequences of the cervical spine, to include the craniocervical junction and cervicothoracic junction, and thoracic spine, were obtained without intravenous contrast. COMPARISON:  MRI of the head July 07, 2016 FINDINGS: MRI CERVICAL SPINE FINDINGS ALIGNMENT: Straightened cervical lordosis.  No malalignment. VERTEBRAE/DISCS: Vertebral bodies are intact. Moderate C4-5 and C5-6 disc height loss, mild at C6-7 with mild desiccation and mild subacute on chronic discogenic endplate changes. Minimal chronic discogenic endplate changes W5-4. No abnormal or acute bone marrow signal. CORD:Cervical spinal cord is normal morphology and signal characteristics from the cervicomedullary junction to level of T1-2, the most caudal well visualized level. POSTERIOR FOSSA, VERTEBRAL ARTERIES, PARASPINAL TISSUES: No MR findings of ligamentous injury. Vertebral artery flow voids present. Included posterior fossa and paraspinal soft tissues are normal. DISC LEVELS (mildly motion degraded axial sequences): C2-3: Uncovertebral hypertrophy and mild  facet arthropathy without canal stenosis or neural foraminal narrowing. C3-4: Annular bulging, uncovertebral hypertrophy. Moderate facet arthropathy without canal stenosis or neural foraminal narrowing. C4-5: Small broad-based disc bulge, uncovertebral hypertrophy mild facet arthropathy. Mild canal stenosis. Moderate neural foraminal narrowing. C5-6: Small broad-based disc bulge, uncovertebral hypertrophy. Mild canal stenosis. Moderate neural foraminal narrowing. C6-7: Small broad-based disc bulge, uncovertebral hypertrophy. No canal stenosis. Moderate LEFT neural foraminal narrowing. Subcentimeter RIGHT C7 perineural cyst. C7-T1: No disc bulge, canal stenosis nor neural foraminal narrowing. Moderate to severe LEFT facet arthropathy. Subcentimeter C8 perineural cysts. MRI THORACIC SPINE FINDINGS ALIGNMENT: Maintenance of the thoracic kyphosis. No malalignment. VERTEBRAE/DISCS: Vertebral bodies are intact. Mild C4-5 and C5-6 disc height loss, disc desiccation all thoracic disc compatible with degenerative discs. Mild subacute on chronic discogenic endplate changes O2-7. No abnormal or acute bone marrow signal. Scattered old Schmorl's nodes. Scattered hemangiomata. CORD: Thoracic spinal cord is normal morphology and signal characteristics to the level of the conus medullaris which terminates at T12-L1. Ventral spinal cord deformity at T5-6 as described below. Multilevel subcentimeter perineural cysts. PREVERTEBRAL AND PARASPINAL SOFT TISSUES: Similar are mild subcarinal lymphadenopathy. Ectatic thoracic aorta. DISC LEVELS: T4-5: Small RIGHT central disc protrusion without canal stenosis or neural foraminal narrowing. T5-6: 5 x 9 x 11 mm RIGHT central disc extrusion, calcified on prior CT results and mild canal stenosis and ventral cord deformity. No neural foraminal narrowing. T6-7: Small central disc protrusion without canal stenosis or neural foraminal narrowing. T10-11: Tiny LEFT central disc protrusion. No disc  bulge, canal stenosis nor neural foraminal narrowing at any remaining level. IMPRESSION: MRI CERVICAL SPINE: No findings of demyelination, contrast enhanced sequences are more sensitive for acute demyelination. Degenerative  cervical spine resulting in mild canal stenosis C5-6 and C6-7. Moderate C4-5 through C6-7 neural foraminal narrowing. MRI THORACIC SPINE: No findings of demyelination, contrast enhanced sequences are more sensitive for acute demyelination. Re- demonstration of 5 x 9 x 11 mm T5-6 calcified extrusion resulting in mild canal stenosis. Electronically Signed   By: Elon Alas M.D.   On: 07/08/2016 20:59   Mr Lumbar Spine Wo Contrast  Result Date: 07/14/2016 CLINICAL DATA:  Bilateral lower extremity neuropathic pain and weakness. EXAM: MRI LUMBAR SPINE WITHOUT CONTRAST TECHNIQUE: Multiplanar, multisequence MR imaging of the lumbar spine was performed. No intravenous contrast was administered. COMPARISON:  CT abdomen and pelvis April 22, 2015 FINDINGS: SEGMENTATION: For the purposes of this report, the last well-formed intervertebral disc will be described as L5-S1. ALIGNMENT: Maintenance of the lumbar lordosis. No malalignment. VERTEBRAE:Vertebral bodies are intact. Intervertebral discs demonstrate normal morphology, decreased T2 signal within the included thoracic and lumbar discs L4-5 compatible with mild desiccation. Multilevel minimal chronic discogenic endplate changes, less than expected. No abnormal or acute bone marrow signal. CONUS MEDULLARIS: Conus medullaris terminates at T12-L1 and demonstrates normal morphology and signal characteristics. Cauda equina is normal. Subcentimeter bilateral S2 Tarlov cysts. Subcentimeter included thoracic perineural cyst. PARASPINAL AND SOFT TISSUES: Included prevertebral and paraspinal soft tissues are nonacute. Aortoiliac endovascular stent graft better characterized on prior CT. Mild symmetric paraspinal muscle atrophy. Heterogeneous uterus,  possible leiomyomata. DISC LEVELS: L1-2: Annular bulging asymmetric to the RIGHT. No canal stenosis or neural foraminal narrowing. L2-3: No disc bulge, canal stenosis nor neural foraminal narrowing. Mild facet arthropathy. L3-4: Annular bulging. Moderate LEFT facet arthropathy and ligamentum flavum redundancy. Mild canal stenosis, no neural foraminal narrowing. L4-5: Annular bulging. Moderate LEFT greater than RIGHT facet arthropathy and ligamentum flavum redundancy resulting in mild canal stenosis, no neural foraminal narrowing. L5-S1: No disc bulge. Mild RIGHT, severe LEFT facet arthropathy. No canal stenosis or neural foraminal narrowing. IMPRESSION: Less than expected for age degenerative change of the lumbar spine. Mild canal stenosis L3-4 and L4-5. No neural foraminal narrowing. Electronically Signed   By: Elon Alas M.D.   On: 07/14/2016 14:54   Dg Chest Port 1 View  Result Date: 07/14/2016 CLINICAL DATA:  Patient coughing up blood this AM. Hx of AFIB. EXAM: PORTABLE CHEST 1 VIEW COMPARISON:  07/07/2016 FINDINGS: The heart is enlarged and stable in configuration. There has been development of bilateral upper lobe airspace filling opacities. Findings are consistent with hemorrhage or infectious infiltrate. The appearance is atypical for edema. IMPRESSION: New bilateral upper lobe airspace filling opacities consistent with hemorrhage and/or infection. Stable cardiomegaly. Electronically Signed   By: Nolon Nations M.D.   On: 07/14/2016 12:45    Assessment/Plan  Physical deconditioning Will have patient work with PT/OT as tolerated to regain strength and restore function.  Fall precautions are in place. Encephalopathy has resolved and was likely related to her use of steroids. Per patient was started on prednisone few days before being hospitalized and was taking it on the day of hospitalization.   Right leg weakness Unclear etiology. Will have her work with physical therapy and occupational  therapy team to help with gait training and muscle strengthening exercises.fall precautions. Skin care. Encourage to be out of bed. Make neurology follow up.  Chronic systolic CHF s/p iv diuresis for acute exacerbation in hospital. Continue lasix 40 mg bid, metoprolol tartrate 12.5 mg bid. Continue kcl. Monitor weight and BMP  gerd Continue levothyroxine 50 mcg daily and monitor  Hyperlipidemia Continue pravachol  Hypothyroidism Lab Results  Component Value Date   TSH 6.060 (H) 07/07/2016   Currently on levothyroxine 50 mcg qd, check tsh in 4 weeks  afib Controlled heart rate. Continue digoxin and metoprolol tartrate with coumadin. Goal inr 2-3.   Neuropathic pain Continue gabapentin 300 mg tid and monitor  Allergic rhinitis On azelastine nasal spray, monitor. Continue montelukast and cetirizine  COPD Continue budesonide bid and trelegy daily. Continue anoro and proventil  Vertigo On prn meclizine and prn prochlorperazine, monitor, slow position change encouraged  ckd stage 3 Monitor bmp   Goals of care: short term rehabilitation   Labs/tests ordered: cbc, bmp  Family/ staff Communication: reviewed care plan with patient and nursing supervisor    Blanchie Serve, MD Internal Medicine [place], [place] [address] Cell Phone (Monday-Friday 8 am - 5 pm): [phone] On Call: [phone] and follow prompts after 5 pm and on weekends Office Phone: [phone] Office Fax: [phone]

## 2016-07-22 LAB — BASIC METABOLIC PANEL
BUN: 20 mg/dL (ref 4–21)
Creatinine: 1.5 mg/dL — AB (ref 0.5–1.1)
Glucose: 79 mg/dL
Potassium: 4.2 mmol/L (ref 3.4–5.3)
SODIUM: 144 mmol/L (ref 137–147)

## 2016-07-22 LAB — CBC AND DIFFERENTIAL
HEMATOCRIT: 44 % (ref 36–46)
Hemoglobin: 14.3 g/dL (ref 12.0–16.0)
PLATELETS: 327 10*3/uL (ref 150–399)
WBC: 7 10*3/mL

## 2016-07-23 ENCOUNTER — Non-Acute Institutional Stay (SKILLED_NURSING_FACILITY): Payer: Medicare Other | Admitting: Family

## 2016-07-23 ENCOUNTER — Encounter: Payer: Self-pay | Admitting: Family

## 2016-07-23 DIAGNOSIS — R2681 Unsteadiness on feet: Secondary | ICD-10-CM

## 2016-07-23 DIAGNOSIS — I482 Chronic atrial fibrillation, unspecified: Secondary | ICD-10-CM

## 2016-07-23 DIAGNOSIS — E782 Mixed hyperlipidemia: Secondary | ICD-10-CM | POA: Diagnosis not present

## 2016-07-23 DIAGNOSIS — F329 Major depressive disorder, single episode, unspecified: Secondary | ICD-10-CM | POA: Diagnosis not present

## 2016-07-23 DIAGNOSIS — E039 Hypothyroidism, unspecified: Secondary | ICD-10-CM

## 2016-07-23 DIAGNOSIS — K219 Gastro-esophageal reflux disease without esophagitis: Secondary | ICD-10-CM

## 2016-07-23 DIAGNOSIS — I10 Essential (primary) hypertension: Secondary | ICD-10-CM | POA: Diagnosis not present

## 2016-07-23 DIAGNOSIS — F419 Anxiety disorder, unspecified: Secondary | ICD-10-CM | POA: Diagnosis not present

## 2016-07-23 DIAGNOSIS — I5022 Chronic systolic (congestive) heart failure: Secondary | ICD-10-CM

## 2016-07-23 DIAGNOSIS — J449 Chronic obstructive pulmonary disease, unspecified: Secondary | ICD-10-CM

## 2016-07-25 NOTE — Progress Notes (Signed)
Location:  [facility] Room Number: [phone] Place of Service:  SNF [phone])  Provider: Marlowe Sax FNP-C   PCP: [name], MD Patient Care Team: [name], MD as PCP - General (Internal Medicine) Evans Lance, MD as Consulting Physician (Cardiology) Georgeann Oppenheim, MD as Consulting Physician (Ophthalmology) Terrance Mass, MD as Consulting Physician (Gynecology)  Extended Emergency Contact Information Primary Emergency Contact: Geiman,Douglas Address: [place], [place] [address] [place] Phone: [phone] Mobile Phone: [phone] Relation: Spouse Secondary Emergency Contact: Wyrick,[name]  [place] of [place] Phone: [phone] Mobile Phone: [phone] Relation: Daughter  Code Status: Full code  Goals of care:  Advanced Directive information Advanced Directives 07/23/2016  Does Patient Have a Medical Advance Directive? No  Type of Advance Directive -  Does patient want to make changes to medical advance directive? -  Copy of [place] in Chart? -  Would patient like information on creating a medical advance directive? -  Pre-existing out of facility DNR order (yellow form or pink MOST form) -     Allergies  Allergen Reactions  . Amiodarone Other (See Comments)    PULMONARY TOXICITY  . Diovan [Valsartan] Other (See Comments)    HYPOTENSION  . Doxycycline Diarrhea and Other (See Comments)    VISUAL DISTURBANCE  . Flexeril [Cyclobenzaprine] Other (See Comments)    FATIGUE  . Keflex [Cephalexin] Diarrhea  . Verapamil Other (See Comments)    EDEMA  . Advair Diskus [Fluticasone-Salmeterol] Other (See Comments)    SKIN CHANGES.  Can tolerate low dose of prednisone without complications  . Codeine Other (See Comments)    unknown    Chief Complaint  Patient presents with  . Discharge Note    HPI:  76 y.o. female seen today at [facility] for discharge home. Was here for short term rehabilitation for post hospital admission for from 07/07/2016-07/16/2016 with acute encephalopathy.She underwent extensive workup with unclear etiology. MRI brain done showed 9 mm left cerebellar demyelination and neurology was consulted. 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 She also had weakness to RLE and had MRI spine that was unrevealing. She had hemoptysis and dysphagia.Pulmonology and GI along with SLP was consulted.No new recommendation was made. She also had acute on chronic systolic CHF and required diuresis.She has a medical history of HTN,CHF, Afib, Hyperlipidemia,GERD, Anxiety, Depression among other conditions.She is seen in her room today.she has had unremarkable stay here in Rehab.she is on Warfarin 3 mg tablet daily for Afib. Recent INR was 2.4 next INR due 07/26/2016. She prefers for her INR to be checked by her PCP.    She has worked well with PT/OT now stable for discharge home.She will be discharged home with Home health PT to continue with ROM, Exercise, Gait stability and muscle strengthening.She does not require any DME;has own FWW and cane at home. Home health services will be arranged by facility social worker prior to discharge. She will discharge home with her medication from the facility.Prescription medication will be written x 1 month then patient to follow up with PCP in 1-2 weeks. Facility staff report no new concerns.   Past Medical History:  Diagnosis Date  . Atrial fibrillation ([place])   . Migraine headache   . Osteopenia     Past Surgical History:  Procedure Laterality Date  . ABDOMINAL AORTIC ENDOVASCULAR STENT GRAFT N/A 04/11/2013  Procedure: ABDOMINAL AORTIC ENDOVASCULAR STENT GRAFT WITH RIGHT FEMORAL PATCH ANGIOPLASTY;  Surgeon: Mal Misty, MD;  Location: [facility];  Service: Vascular;  Laterality: N/A;  . BREAST SURGERY     LEFT BREAST BIOPSY  . broken leg Left 1970s  . CARDIAC  CATHETERIZATION    . CHOLECYSTECTOMY  1972  . COLONOSCOPY    . EYE SURGERY Bilateral   . Laser vein procedure    . REFRACTIVE SURGERY    . TONSILLECTOMY        reports that she quit smoking about 13 years ago. Her smoking use included Cigarettes. She has never used smokeless tobacco. She reports that she drinks alcohol. She reports that she does not use drugs. Social History   Social History  . Marital status: Married    Spouse name: N/A  . Number of children: N/A  . Years of education: N/A   Occupational History  . Not on file.   Social History Main Topics  . Smoking status: Former Smoker    Types: Cigarettes    Quit date: 08/10/2002  . Smokeless tobacco: Never Used     Comment: History of tobacco abuse  . Alcohol use 0.0 oz/week     Comment: rare  . Drug use: No  . Sexual activity: Yes    Birth control/ protection: Post-menopausal   Other Topics Concern  . Not on file   Social History Narrative   Lives in [place] with husband   One daughter    Allergies  Allergen Reactions  . Amiodarone Other (See Comments)    PULMONARY TOXICITY  . Diovan [Valsartan] Other (See Comments)    HYPOTENSION  . Doxycycline Diarrhea and Other (See Comments)    VISUAL DISTURBANCE  . Flexeril [Cyclobenzaprine] Other (See Comments)    FATIGUE  . Keflex [Cephalexin] Diarrhea  . Verapamil Other (See Comments)    EDEMA  . Advair Diskus [Fluticasone-Salmeterol] Other (See Comments)    SKIN CHANGES.  Can tolerate low dose of prednisone without complications  . Codeine Other (See Comments)    unknown    Pertinent  Health Maintenance Due  Topic Date Due  . MAMMOGRAM  09/03/2016  . DEXA SCAN  09/14/2016  . INFLUENZA VACCINE  10/13/2016  . COLONOSCOPY  03/15/2022  . PNA vac Low Risk Adult  Completed    Medications: Allergies as of 07/23/2016      Reactions   Amiodarone Other (See Comments)   PULMONARY TOXICITY   Diovan [valsartan] Other (See Comments)   HYPOTENSION    Doxycycline Diarrhea, Other (See Comments)   VISUAL DISTURBANCE   Flexeril [cyclobenzaprine] Other (See Comments)   FATIGUE   Keflex [cephalexin] Diarrhea   Verapamil Other (See Comments)   EDEMA   Advair Diskus [fluticasone-salmeterol] Other (See Comments)   SKIN CHANGES.  Can tolerate low dose of prednisone without complications   Codeine Other (See Comments)   unknown      Medication List       Accurate as of 07/23/16 11:59 PM. Always use your most recent med list.          acetaminophen 500 MG tablet Commonly known as:  TYLENOL Take 1,000 mg by mouth 2 (two) times daily as needed for moderate pain.   albuterol 108 (90 Base) MCG/ACT inhaler Commonly known as:  PROVENTIL HFA;VENTOLIN HFA Inhale 2 puffs into the lungs every 4 (four) hours as needed for shortness of breath. Inhale 2 puffs into the lungs 5 minutes apart every 4 to 6  hours as needed to rescue asthma   allopurinol 300 MG tablet Commonly known as:  ZYLOPRIM TAKE 1 TABLET BY MOUTH EVERY DAY   ALPRAZolam 0.5 MG tablet Commonly known as:  XANAX Take 0.5 mg by mouth every 8 (eight) hours as needed for anxiety.   azelastine 0.1 % nasal spray Commonly known as:  ASTELIN Place 2 sprays into both nostrils 2 (two) times daily. Use in each nostril as directed   budesonide 0.25 MG/2ML nebulizer solution Commonly known as:  PULMICORT Take 2 mLs (0.25 mg total) by nebulization 2 (two) times daily.   buPROPion 150 MG 24 hr tablet Commonly known as:  WELLBUTRIN XL Take 1 tablet (150 mg total) by mouth every morning.   cetirizine 10 MG tablet Commonly known as:  ZYRTEC TAKE 1 CAPSULE (10 MG TOTAL) BY MOUTH AT BEDTIME.   digoxin 0.125 MG tablet Commonly known as:  LANOXIN Take 0.0625 mg by mouth daily.   Fluticasone-Umeclidin-Vilant 100-62.5-25 MCG/INH Aepb Commonly known as:  TRELEGY ELLIPTA Inhale 1 puff into the lungs daily.   furosemide 40 MG tablet Commonly known as:  LASIX 40mg  twice daily     gabapentin 300 MG capsule Commonly known as:  NEURONTIN TAKE ONE CAPSULE BY MOUTH 3 TIMES A DAY   hydroxypropyl methylcellulose / hypromellose 2.5 % ophthalmic solution Commonly known as:  ISOPTO TEARS / GONIOVISC Place 1 drop into both eyes 4 (four) times daily as needed for dry eyes.   levothyroxine 50 MCG tablet Commonly known as:  SYNTHROID, LEVOTHROID TAKE 1 TABLET EVERY DAY   meclizine 25 MG tablet Commonly known as:  ANTIVERT Take 1 tablet (25 mg total) by mouth 3 (three) times daily as needed for dizziness.   metoprolol tartrate 25 MG tablet Commonly known as:  LOPRESSOR Take 0.5 tablets (12.5 mg total) by mouth 2 (two) times daily.   montelukast 10 MG tablet Commonly known as:  SINGULAIR TAKE 1 TABLET EVERY DAY FOR ALLERGIES   pantoprazole 40 MG tablet Commonly known as:  PROTONIX Take 1 tablet (40 mg total) by mouth daily.   potassium chloride SA 20 MEQ tablet Commonly known as:  KLOR-CON M20 Take 2 tablets (40 mEq total) by mouth daily.   pravastatin 40 MG tablet Commonly known as:  PRAVACHOL TAKE 1 TABLET BY MOUTH AT BEDTIME FOR CHOLESTEROL   prochlorperazine 5 MG tablet Commonly known as:  COMPAZINE TAKE 1 TABLET BY MOUTH 3 TIMES A DAY FOR VERTIGO OR NAUSEA   umeclidinium-vilanterol 62.5-25 MCG/INH Aepb Commonly known as:  ANORO ELLIPTA Inhale 1 puff into the lungs daily.   warfarin 3 MG tablet Commonly known as:  COUMADIN Take 3 mg by mouth daily.       Review of Systems  Constitutional: Negative for activity change, appetite change, chills, fatigue and fever.  HENT: Negative for congestion, rhinorrhea, sinus pain, sinus pressure, sneezing and sore throat.   Eyes: Negative.   Respiratory: Negative for cough, chest tightness, shortness of breath and wheezing.   Cardiovascular: Negative for chest pain, palpitations and leg swelling.  Gastrointestinal: Negative for abdominal distention, abdominal pain, constipation, diarrhea, nausea and vomiting.   Endocrine: Negative.   Genitourinary: Negative for dysuria, flank pain, frequency and urgency.  Musculoskeletal: Positive for gait problem.  Skin: Negative for color change, pallor and rash.  Neurological: Negative for dizziness, seizures, syncope, light-headedness and headaches.  Hematological: Does not bruise/bleed easily.  Psychiatric/Behavioral: Negative for agitation, confusion, hallucinations and sleep disturbance. The patient is not nervous/anxious.  Vitals:   07/23/16 1203  BP: 130/70  Pulse: 76  Resp: 18  Temp: 97.2 F (36.2 C)  SpO2: 97%  Weight: 197 lb 3.2 oz (89.4 kg)  Height: 5\' 3"  (1.6 m)   Body mass index is 34.93 kg/m. Physical Exam  Constitutional: She is oriented to person, place, and time. She appears well-developed and well-nourished. No distress.  HENT:  Head: Normocephalic.  Mouth/Throat: Oropharynx is clear and moist. No oropharyngeal exudate.  Eyes: Conjunctivae and EOM are normal. Pupils are equal, round, and reactive to light. Right eye exhibits no discharge. Left eye exhibits no discharge. No scleral icterus.  Neck: Normal range of motion. No JVD present. No thyromegaly present.  Cardiovascular: Normal rate, regular rhythm, normal heart sounds and intact distal pulses.  Exam reveals no gallop and no friction rub.   No murmur heard. Pulmonary/Chest: Breath sounds normal. No respiratory distress. She has no wheezes. She has no rales.  Abdominal: Soft. Bowel sounds are normal. She exhibits no distension. There is no tenderness. There is no rebound and no guarding.  Musculoskeletal: She exhibits no edema, tenderness or deformity.  Moves x 4 extremities without any difficulties. Unsteady gait.   Lymphadenopathy:    She has no cervical adenopathy.  Neurological: She is oriented to person, place, and time.  Skin: Skin is warm and dry. No rash noted. No erythema. No pallor.  Psychiatric: She has a normal mood and affect.    Labs reviewed: Basic  Metabolic Panel:  Recent Labs  03/01/16 1128  06/16/16 1354  07/09/16 0239  07/12/16 0526  07/14/16 0510 07/15/16 0956 07/16/16 0342 07/22/16  NA 140  < > 140  < > 133*  < > 134*  < > 137 136 137 144  K 4.4  < > 4.4  < > 3.8  < > 4.1  < > 3.7 3.7 3.7 4.2  CL 104  < > 101  < > 91*  < > 101  < > 103 102 102  --   CO2 18*  < > 22  < > 34*  < > 23  < > 25 26 23   --   GLUCOSE 136*  < > 85  < > 104*  < > 84  < > 79 84 82  --   BUN 16  < > 26*  < > 36*  < > 11  < > 12 12 14 20   CREATININE 1.32*  < > 1.34*  < > 1.59*  < > 1.18*  < > 1.19* 1.41* 1.42* 1.5*  CALCIUM 9.4  < > 10.0  < > 8.9  < > 8.4*  < > 8.2* 8.6* 8.5*  --   MG 1.9  --  2.1  --  2.1  --   --   --   --   --   --   --   PHOS  --   --   --   --   --   --  2.3*  --   --   --   --   --   < > = values in this interval not displayed. Liver Function Tests:  Recent Labs  06/16/16 1354 07/10/16 0228 07/11/16 0402 07/12/16 0526  AST 18 22 25   --   ALT 17 19 22   --   ALKPHOS 42 31* 33*  --   BILITOT 0.5 0.7 1.1  --   PROT 6.2 5.1* 5.7*  --   ALBUMIN 3.8 2.9* 3.2*  2.8*    Recent Labs  07/07/16 1941  AMMONIA 26   CBC:  Recent Labs  03/01/16 1128  06/16/16 1354 06/23/16 1144  07/14/16 0510 07/15/16 0347 07/16/16 0342 07/22/16  WBC 9.4  < > 8.3 9.1  < > 7.4 8.4 7.2 7.0  NEUTROABS 5,828  --  5,229 5,733  --   --   --   --   --   HGB 14.5  < > 15.5 15.8*  < > 11.6* 11.7* 12.5 14.3  HCT 43.9  < > 46.7* 46.3*  < > 35.5* 36.7 38.1 44  MCV 97.6  < > 90.2 89.2  < > 92.0 92.4 91.8  --   PLT 232  < > 222 214  < > 194 200 214 327  < > = values in this interval not displayed. Cardiac Enzymes:  Recent Labs  07/07/16 2249 07/08/16 0559 07/11/16 0402  TROPONINI 0.08* 0.09* 0.07*    Recent Labs  12/25/15 1152  GLUCAP 145*    Recent Procedures and Imaging Studies: Dg Chest 2 View  Result Date: 07/07/2016 CLINICAL DATA:  Mid chest pain, dizziness, low blood pressure chest x-ray 411 1,018 EXAM: CHEST  2 VIEW  COMPARISON:  None. FINDINGS: The lungs remain clear and somewhat hyperaerated. Mediastinal and hilar contours are unremarkable. Mild cardiomegaly is stable. No acute bony abnormality is seen. IMPRESSION: Stable cardiomegaly and hyperaeration.  No active lung disease. Electronically Signed   By: Ivar Drape M.D.   On: 07/07/2016 13:41   Mr Brain Wo Contrast  Result Date: 07/07/2016 CLINICAL DATA:  Encephalopathy. Word-finding difficulties for a few months, more forgetful. History of atrial fibrillation, migraine headache, pulmonary fibrosis. EXAM: MRI HEAD WITHOUT CONTRAST TECHNIQUE: Multiplanar, multiecho pulse sequences of the brain and surrounding structures were obtained without intravenous contrast. GFR 22. COMPARISON:  CT HEAD January 12, 2014 FINDINGS: BRAIN: No reduced diffusion to suggest acute ischemia. No susceptibility artifact to suggest hemorrhage. 9 mm rounded focus of bright T2 and low T1 signal LEFT cerebellum with intermediate FLAIR signal and, T2 shine through. Patchy supratentorial white matter FLAIR T2 hyperintensities without midline shift or mass effect. Ventricles and sulci are normal for patient's age. No abnormal extra-axial fluid collections. VASCULAR: Normal major intracranial vascular flow voids present at skull base. SKULL AND UPPER CERVICAL SPINE: No abnormal sellar expansion. No suspicious calvarial bone marrow signal. Craniocervical junction maintained. SINUSES/ORBITS: The mastoid air-cells and included paranasal sinuses are well-aerated. The included ocular globes and orbital contents are non-suspicious. Status post bilateral ocular lens implants. OTHER: Patient is edentulous. IMPRESSION: 9 mm LEFT cerebellar focus of apparent demyelination though, atypical for a patient of this age. Findings less likely represent subacute infarct. Given patient's GFR 22, consider consultation with nephrology prior to contrast administration. Recommend follow-up MRI of the brain in 6-8 weeks,  with contrast if able. Moderate chronic small vessel ischemic disease. Electronically Signed   By: Elon Alas M.D.   On: 07/07/2016 19:09   Mr Cervical Spine Wo Contrast  Result Date: 07/08/2016 CLINICAL DATA:  Follow-up abnormal brain MRI. A few months of word-finding difficulties and forgetfulness. History of migraine headaches and atrial fibrillation. Possible LEFT cerebellar demyelination. Assess for spinal demyelination. EXAM: MRI CERVICAL, THORACIC SPINE WITHOUT CONTRAST TECHNIQUE: Multiplanar and multiecho pulse sequences of the cervical spine, to include the craniocervical junction and cervicothoracic junction, and thoracic spine, were obtained without intravenous contrast. COMPARISON:  MRI of the head July 07, 2016 FINDINGS: MRI CERVICAL SPINE FINDINGS ALIGNMENT: Straightened cervical lordosis.  No malalignment. VERTEBRAE/DISCS: Vertebral bodies are intact. Moderate C4-5 and C5-6 disc height loss, mild at C6-7 with mild desiccation and mild subacute on chronic discogenic endplate changes. Minimal chronic discogenic endplate changes L8-7. No abnormal or acute bone marrow signal. CORD:Cervical spinal cord is normal morphology and signal characteristics from the cervicomedullary junction to level of T1-2, the most caudal well visualized level. POSTERIOR FOSSA, VERTEBRAL ARTERIES, PARASPINAL TISSUES: No MR findings of ligamentous injury. Vertebral artery flow voids present. Included posterior fossa and paraspinal soft tissues are normal. DISC LEVELS (mildly motion degraded axial sequences): C2-3: Uncovertebral hypertrophy and mild facet arthropathy without canal stenosis or neural foraminal narrowing. C3-4: Annular bulging, uncovertebral hypertrophy. Moderate facet arthropathy without canal stenosis or neural foraminal narrowing. C4-5: Small broad-based disc bulge, uncovertebral hypertrophy mild facet arthropathy. Mild canal stenosis. Moderate neural foraminal narrowing. C5-6: Small broad-based disc  bulge, uncovertebral hypertrophy. Mild canal stenosis. Moderate neural foraminal narrowing. C6-7: Small broad-based disc bulge, uncovertebral hypertrophy. No canal stenosis. Moderate LEFT neural foraminal narrowing. Subcentimeter RIGHT C7 perineural cyst. C7-T1: No disc bulge, canal stenosis nor neural foraminal narrowing. Moderate to severe LEFT facet arthropathy. Subcentimeter C8 perineural cysts. MRI THORACIC SPINE FINDINGS ALIGNMENT: Maintenance of the thoracic kyphosis. No malalignment. VERTEBRAE/DISCS: Vertebral bodies are intact. Mild C4-5 and C5-6 disc height loss, disc desiccation all thoracic disc compatible with degenerative discs. Mild subacute on chronic discogenic endplate changes F6-4. No abnormal or acute bone marrow signal. Scattered old Schmorl's nodes. Scattered hemangiomata. CORD: Thoracic spinal cord is normal morphology and signal characteristics to the level of the conus medullaris which terminates at T12-L1. Ventral spinal cord deformity at T5-6 as described below. Multilevel subcentimeter perineural cysts. PREVERTEBRAL AND PARASPINAL SOFT TISSUES: Similar are mild subcarinal lymphadenopathy. Ectatic thoracic aorta. DISC LEVELS: T4-5: Small RIGHT central disc protrusion without canal stenosis or neural foraminal narrowing. T5-6: 5 x 9 x 11 mm RIGHT central disc extrusion, calcified on prior CT results and mild canal stenosis and ventral cord deformity. No neural foraminal narrowing. T6-7: Small central disc protrusion without canal stenosis or neural foraminal narrowing. T10-11: Tiny LEFT central disc protrusion. No disc bulge, canal stenosis nor neural foraminal narrowing at any remaining level. IMPRESSION: MRI CERVICAL SPINE: No findings of demyelination, contrast enhanced sequences are more sensitive for acute demyelination. 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 Moderate C4-5 through C6-7 neural foraminal narrowing. MRI THORACIC SPINE: No findings of  demyelination, contrast enhanced sequences are more sensitive for acute demyelination. Re- demonstration of 5 x 9 x 11 mm T5-6 calcified extrusion resulting in mild canal stenosis. Electronically Signed   By: Elon Alas M.D.   On: 07/08/2016 20:59   Mr Thoracic Spine Wo Contrast  Result Date: 07/08/2016 CLINICAL DATA:  Follow-up abnormal brain MRI. A few months of word-finding difficulties and forgetfulness. History of migraine headaches and atrial fibrillation. Possible LEFT cerebellar demyelination. Assess for spinal demyelination. EXAM: MRI CERVICAL, THORACIC SPINE WITHOUT CONTRAST TECHNIQUE: Multiplanar and multiecho pulse sequences of the cervical spine, to include the craniocervical junction and cervicothoracic junction, and thoracic spine, were obtained without intravenous contrast. COMPARISON:  MRI of the head July 07, 2016 FINDINGS: MRI CERVICAL SPINE FINDINGS ALIGNMENT: Straightened cervical lordosis.  No malalignment. VERTEBRAE/DISCS: Vertebral bodies are intact. Moderate C4-5 and C5-6 disc height loss, mild at C6-7 with mild desiccation and mild subacute on chronic discogenic endplate changes. Minimal chronic discogenic endplate changes P3-2. No abnormal or acute bone marrow signal. CORD:Cervical spinal cord is normal morphology and signal characteristics from  the cervicomedullary junction to level of T1-2, the most caudal well visualized level. POSTERIOR FOSSA, VERTEBRAL ARTERIES, PARASPINAL TISSUES: No MR findings of ligamentous injury. Vertebral artery flow voids present. Included posterior fossa and paraspinal soft tissues are normal. DISC LEVELS (mildly motion degraded axial sequences): C2-3: Uncovertebral hypertrophy and mild facet arthropathy without canal stenosis or neural foraminal narrowing. C3-4: Annular bulging, uncovertebral hypertrophy. Moderate facet arthropathy without canal stenosis or neural foraminal narrowing. C4-5: Small broad-based disc bulge, uncovertebral hypertrophy  mild facet arthropathy. Mild canal stenosis. Moderate neural foraminal narrowing. C5-6: Small broad-based disc bulge, uncovertebral hypertrophy. Mild canal stenosis. Moderate neural foraminal narrowing. C6-7: Small broad-based disc bulge, uncovertebral hypertrophy. No canal stenosis. Moderate LEFT neural foraminal narrowing. Subcentimeter RIGHT C7 perineural cyst. C7-T1: No disc bulge, canal stenosis nor neural foraminal narrowing. Moderate to severe LEFT facet arthropathy. Subcentimeter C8 perineural cysts. MRI THORACIC SPINE FINDINGS ALIGNMENT: Maintenance of the thoracic kyphosis. No malalignment. VERTEBRAE/DISCS: Vertebral bodies are intact. Mild C4-5 and C5-6 disc height loss, disc desiccation all thoracic disc compatible with degenerative discs. Mild subacute on chronic discogenic endplate changes I7-7. No abnormal or acute bone marrow signal. Scattered old Schmorl's nodes. Scattered hemangiomata. CORD: Thoracic spinal cord is normal morphology and signal characteristics to the level of the conus medullaris which terminates at T12-L1. Ventral spinal cord deformity at T5-6 as described below. Multilevel subcentimeter perineural cysts. PREVERTEBRAL AND PARASPINAL SOFT TISSUES: Similar are mild subcarinal lymphadenopathy. Ectatic thoracic aorta. DISC LEVELS: T4-5: Small RIGHT central disc protrusion without canal stenosis or neural foraminal narrowing. T5-6: 5 x 9 x 11 mm RIGHT central disc extrusion, calcified on prior CT results and mild canal stenosis and ventral cord deformity. No neural foraminal narrowing. T6-7: Small central disc protrusion without canal stenosis or neural foraminal narrowing. T10-11: Tiny LEFT central disc protrusion. No disc bulge, canal stenosis nor neural foraminal narrowing at any remaining level. IMPRESSION: MRI CERVICAL SPINE: No findings of demyelination, contrast enhanced sequences are more sensitive for acute demyelination. Degenerative cervical spine resulting in mild canal  stenosis C5-6 and C6-7. Moderate C4-5 through C6-7 neural foraminal narrowing. MRI THORACIC SPINE: No findings of demyelination, contrast enhanced sequences are more sensitive for acute demyelination. Re- demonstration of 5 x 9 x 11 mm T5-6 calcified extrusion resulting in mild canal stenosis. Electronically Signed   By: Elon Alas M.D.   On: 07/08/2016 20:59   Mr Lumbar Spine Wo Contrast  Result Date: 07/14/2016 CLINICAL DATA:  Bilateral lower extremity neuropathic pain and weakness. EXAM: MRI LUMBAR SPINE WITHOUT CONTRAST TECHNIQUE: Multiplanar, multisequence MR imaging of the lumbar spine was performed. No intravenous contrast was administered. COMPARISON:  CT abdomen and pelvis April 22, 2015 FINDINGS: SEGMENTATION: For the purposes of this report, the last well-formed intervertebral disc will be described as L5-S1. ALIGNMENT: Maintenance of the lumbar lordosis. No malalignment. VERTEBRAE:Vertebral bodies are intact. Intervertebral discs demonstrate normal morphology, decreased T2 signal within the included thoracic and lumbar discs L4-5 compatible with mild desiccation. Multilevel minimal chronic discogenic endplate changes, less than expected. No abnormal or acute bone marrow signal. CONUS MEDULLARIS: Conus medullaris terminates at T12-L1 and demonstrates normal morphology and signal characteristics. Cauda equina is normal. Subcentimeter bilateral S2 Tarlov cysts. Subcentimeter included thoracic perineural cyst. PARASPINAL AND SOFT TISSUES: Included prevertebral and paraspinal soft tissues are nonacute. Aortoiliac endovascular stent graft better characterized on prior CT. Mild symmetric paraspinal muscle atrophy. Heterogeneous uterus, possible leiomyomata. DISC LEVELS: L1-2: Annular bulging asymmetric to the RIGHT. No canal stenosis or neural foraminal narrowing. L2-3: No disc  bulge, canal stenosis nor neural foraminal narrowing. Mild facet arthropathy. L3-4: Annular bulging. Moderate LEFT facet  arthropathy and ligamentum flavum redundancy. Mild canal stenosis, no neural foraminal narrowing. L4-5: Annular bulging. Moderate LEFT greater than RIGHT facet arthropathy and ligamentum flavum redundancy resulting in mild canal stenosis, no neural foraminal narrowing. L5-S1: No disc bulge. Mild RIGHT, severe LEFT facet arthropathy. No canal stenosis or neural foraminal narrowing. IMPRESSION: Less than expected for age degenerative change of the lumbar spine. Mild canal stenosis L3-4 and L4-5. No neural foraminal narrowing. Electronically Signed   By: Elon Alas M.D.   On: 07/14/2016 14:54   Dg Chest Port 1 View  Result Date: 07/14/2016 CLINICAL DATA:  Patient coughing up blood this AM. Hx of AFIB. EXAM: PORTABLE CHEST 1 VIEW COMPARISON:  07/07/2016 FINDINGS: The heart is enlarged and stable in configuration. There has been development of bilateral upper lobe airspace filling opacities. Findings are consistent with hemorrhage or infectious infiltrate. The appearance is atypical for edema. IMPRESSION: New bilateral upper lobe airspace filling opacities consistent with hemorrhage and/or infection. Stable cardiomegaly. Electronically Signed   By: Nolon Nations M.D.   On: 07/14/2016 12:45   Assessment/Plan:    1. Unsteady gait  Has worked well with PT/ OT. Will discharge home PT/OT to continue with ROM, Exercise, Gait stability and muscle strengthening. No DME required has own FWW and cane at home.Fall and safety precautions.   2. Essential hypertension B/p stable.continue on metoprolol tartrate 12.5 mg tablet twice daily. Monitor BMP in 1-2 weeks with PCP.     3. Chronic systolic CHF (congestive heart failure) Recent hospital admission with exacerbation now stable.Continue on Furosemide, metoprolol and digoxin.Continue on fluid restrictions 1.5 L per day.Monitor daily weight.   4. Chronic atrial fibrillation HR irregular. Continue on Warfarin 3 mg Tablet daily.Recent INR was 2.4 next INR due  07/26/2016. She prefers for her INR to be checked by her PCP.     5. COPD (chronic obstructive pulmonary disease) with chronic bronchitis  Breathing stable.continue on Anoro Ellipta, Trelegy Ellipta, Pulmicort and Albuterol.on Montelukast. Continue to monitor.   6. Hypothyroidism Continue on levothyroxine 50 mcg Tablet.Monitor TSH level.   7. Hyperlipidemia Continue on Pravastatin and heart healthy diet. Monitor Lipid panel.   8. Gastroesophageal reflux disease without esophagitis Stable.Continue on pantoprazole.   9. Anxiety and depression Stable.Continue on Bupropion and alprazolam. Monitor for mood changes.   Patient is being discharged with the following home health services:   -PT for ROM, exercise, gait stability and muscle strengthening  Patient is being discharged with the following durable medical equipment:    No DME required has own FWW and cane at home.  Patient has been advised to f/u with their PCP in 1-2 weeks to for a transitions of care visit.Social services at their facility was responsible for arranging this appointment.  Pt was provided with adequate prescriptions of noncontrolled medications to reach the scheduled appointment.For controlled substances, a limited supply was provided as appropriate for the individual patient. If the pt normally receives these medications from a pain clinic or has a contract with another physician, these medications should be received from that clinic or physician only).    Future labs/tests needed:  CBC, BMP in 1-2 weeks PCP.INR due 07/26/2016 with PCP per patient's request.

## 2016-07-26 ENCOUNTER — Ambulatory Visit (INDEPENDENT_AMBULATORY_CARE_PROVIDER_SITE_OTHER): Payer: Medicare Other | Admitting: Internal Medicine

## 2016-07-26 ENCOUNTER — Ambulatory Visit: Payer: Self-pay | Admitting: Internal Medicine

## 2016-07-26 ENCOUNTER — Encounter: Payer: Self-pay | Admitting: Internal Medicine

## 2016-07-26 VITALS — BP 100/66 | HR 72 | Temp 97.4°F | Resp 16 | Ht 63.0 in | Wt 194.6 lb

## 2016-07-26 DIAGNOSIS — E039 Hypothyroidism, unspecified: Secondary | ICD-10-CM | POA: Diagnosis not present

## 2016-07-26 DIAGNOSIS — I482 Chronic atrial fibrillation, unspecified: Secondary | ICD-10-CM

## 2016-07-26 DIAGNOSIS — I10 Essential (primary) hypertension: Secondary | ICD-10-CM

## 2016-07-26 DIAGNOSIS — J45909 Unspecified asthma, uncomplicated: Secondary | ICD-10-CM

## 2016-07-26 DIAGNOSIS — Z7901 Long term (current) use of anticoagulants: Secondary | ICD-10-CM | POA: Diagnosis not present

## 2016-07-26 DIAGNOSIS — G9340 Encephalopathy, unspecified: Secondary | ICD-10-CM

## 2016-07-26 DIAGNOSIS — Z79899 Other long term (current) drug therapy: Secondary | ICD-10-CM

## 2016-07-26 LAB — CBC WITH DIFFERENTIAL/PLATELET
BASOS ABS: 0 {cells}/uL (ref 0–200)
Basophils Relative: 0 %
EOS ABS: 168 {cells}/uL (ref 15–500)
Eosinophils Relative: 2 %
HEMATOCRIT: 42.2 % (ref 35.0–45.0)
HEMOGLOBIN: 13.9 g/dL (ref 11.7–15.5)
LYMPHS ABS: 1428 {cells}/uL (ref 850–3900)
LYMPHS PCT: 17 %
MCH: 30.1 pg (ref 27.0–33.0)
MCHC: 32.9 g/dL (ref 32.0–36.0)
MCV: 91.3 fL (ref 80.0–100.0)
MONO ABS: 924 {cells}/uL (ref 200–950)
MPV: 9.8 fL (ref 7.5–12.5)
Monocytes Relative: 11 %
NEUTROS PCT: 70 %
Neutro Abs: 5880 cells/uL (ref 1500–7800)
Platelets: 425 10*3/uL — ABNORMAL HIGH (ref 140–400)
RBC: 4.62 MIL/uL (ref 3.80–5.10)
RDW: 16.5 % — AB (ref 11.0–15.0)
WBC: 8.4 10*3/uL (ref 3.8–10.8)

## 2016-07-26 LAB — TSH: TSH: 5.98 mIU/L — ABNORMAL HIGH

## 2016-07-26 MED ORDER — DILTIAZEM HCL ER COATED BEADS 120 MG PO CP24: 120.0000 mg | ORAL_CAPSULE | Freq: Every day | ORAL | 0 refills | Status: DC

## 2016-07-26 NOTE — Patient Instructions (Signed)
Bleeding Precautions When on Anticoagulant Therapy WHAT IS ANTICOAGULANT THERAPY? Anticoagulant therapy is taking medicine to prevent or reduce blood clots. It is also called blood thinner therapy. Blood clots that form in your blood vessels can be dangerous. They can break loose and travel to your heart, lungs, or brain. This increases your risk of a heart attack or stroke. Anticoagulant therapy causes blood to clot more slowly. You may need anticoagulant therapy if you have:  A medical condition that increases the likelihood that blood clots will form.  A heart defect or a problem with heart rhythm. It is also a common treatment after heart surgery, such as valve replacement. WHAT ARE COMMON TYPES OF ANTICOAGULANT THERAPY? Anticoagulant medicine can be injected or taken by mouth.If you need anticoagulant therapy quickly at the hospital, the medicine may be injected under your skin or given through an IV tube. Heparin is a common example of an anticoagulant that you may get at the hospital. Most anticoagulant therapy is in the form of pills that you take at home every day. These may include:  Aspirin. This common blood thinner works by preventing blood cells (platelets) from sticking together to form a clot. Aspirin is not as strong as anticoagulants that slow down the time that it takes for your body to form a clot.  Clopidogrel. This is a newer type of drug that affects platelets. It is stronger than aspirin.  Warfarin. This is the most common anticoagulant. It changes the way your body uses vitamin K, a vitamin that helps your blood to clot. The risk of bleeding is higher with warfarin than with aspirin. You will need frequent blood tests to make sure you are taking the safest amount.  New anticoagulants. Several new drugs have been approved. They are all taken by mouth. Studies show that these drugs work as well as warfarin. They do not require blood testing. They may cause less bleeding  risk than warfarin. WHAT DO I NEED TO REMEMBER WHEN TAKING ANTICOAGULANT THERAPY? Anticoagulant therapy decreases your risk of forming a blood clot, but it increases your risk of bleeding. Work closely with your health care provider to make sure you are taking your medicine safely. These tips can help:  Learn ways to reduce your risk of bleeding.  If you are taking warfarin:  Have blood tests as ordered by your health care provider.  Do not make any sudden changes to your diet. Vitamin K in your diet can make warfarin less effective.  Do not get pregnant. This medicine may cause birth defects.  Take your medicine at the same time every day. If you forget to take your medicine, take it as soon as you remember. If you miss a whole day, do not double your dose of medicine. Take your normal dose and call your health care provider to check in.  Do not stop taking your medicine on your own.  Tell your health care provider before you start taking any new medicine, vitamin, or herbal product. Some of these could interfere with your therapy.  Tell all of your health care providers that you are on anticoagulant therapy.  Do not have surgery, medical procedures, or dental work until you tell your health care provider that you are on anticoagulant therapy. WHAT CAN AFFECT HOW ANTICOAGULANTS WORK? Certain foods, vitamins, medicines, supplements, and herbal medicines change the way that anticoagulant therapy works. They may increase or decrease the effects of your anticoagulant therapy. Either result can be dangerous for you.  Many over-the-counter medicines for pain, colds, or stomach problems interfere with anticoagulant therapy. Take these only as told by your health care provider.  Do not drink alcohol. It can interfere with your medicine and increase your risk of an injury that causes bleeding.  If you are taking warfarin, do not begin eating more foods that contain vitamin K. These include  leafy green vegetables. Ask your health care provider if you should avoid any foods. WHAT ARE SOME WAYS TO PREVENT BLEEDING? You can prevent bleeding by taking certain precautions:  Be extra careful when you use knives, scissors, or other sharp objects.  Use an electric razor instead of a blade.  Do not use toothpicks.  Use a soft toothbrush.  Wear shoes that have nonskid soles.  Use bath mats and handrails in your bathroom.  Wear gloves while you do yard work.  Wear a helmet when you ride a bike.  Wear your seat belt.  Prevent falls by removing loose rugs and extension cords from areas where you walk.  Do not play contact sports or participate in other activities that have a high risk of injury. WHEN SHOULD I CONTACT MY HEALTH CARE PROVIDER? Call your health care provider if:  You miss a dose of medicine: ? And you are not sure what to do. ? For more than one day.  You have: ? Menstrual bleeding that is heavier than normal. ? Blood in your urine. ? A bloody nose or bleeding gums. ? Easy bruising. ? Blood in your stool (feces) or have black and tarry stool. ? Side effects from your medicine.  You feel weak or dizzy.  You become pregnant. Seek immediate medical care if:  You have bleeding that will not stop.  You have sudden and severe headache or belly pain.  You vomit or you cough up bright red blood.  You have a severe blow to your head. WHAT ARE SOME QUESTIONS TO ASK MY HEALTH CARE PROVIDER?  What is the best anticoagulant therapy for my condition?  What side effects should I watch for?  When should I take my medicine? What should I do if I forget to take it?  Will I need to have regular blood tests?  Do I need to change my diet? Are there foods or drinks that I should avoid?  What activities are safe for me?  What should I do if I want to get pregnant? This information is not intended to replace advice given to you by your health care provider.  Make sure you discuss any questions you have with your health care provider. Document Released: 02/10/2015 Document Reviewed: 02/10/2015 [employer] Interactive Patient Education  2017 [employer]  ++++++++++++++++++++++++++++++++++ Recommend Adult Low Dose Aspirin or  coated  Aspirin 81 mg daily  To reduce risk of Colon Cancer 20 %,  Skin Cancer 26 % ,  Melanoma 46%  and  Pancreatic cancer 60% +++++++++++++++++++++++++ Vitamin D goal  is between 70-100.  Please make sure that you are taking your Vitamin D as directed.  It is very important as a natural anti-inflammatory  helping hair, skin, and nails, as well as reducing stroke and heart attack risk.  It helps your bones and helps with mood. It also decreases numerous cancer risks so please take it as directed.  Low Vit D is associated with a 200-300% higher risk for CANCER  and 200-300% higher risk for HEART   ATTACK  &  STROKE.   ...................................... It is also associated with   higher death rate at younger ages,  autoimmune diseases like Rheumatoid arthritis, Lupus, Multiple Sclerosis.    Also many other serious conditions, like depression, Alzheimer's Dementia, infertility, muscle aches, fatigue, fibromyalgia - just to name a few. ++++++++++++++++++++ Recommend the book "The END of DIETING" by Dr Joel Fuhrman  & the book "The END of DIABETES " by Dr Joel Fuhrman At [employer] - get book & Audio CD's    Being diabetic has a  300% increased risk for heart attack, stroke, cancer, and alzheimer- type vascular dementia. It is very important that you work harder with diet by avoiding all foods that are white. Avoid white rice (brown & wild rice is OK), white potatoes (sweetpotatoes in moderation is OK), White bread or wheat bread or anything made out of white flour like bagels, donuts, rolls, buns, biscuits, cakes, pastries, cookies, pizza crust, and pasta (made from white flour & egg whites) - vegetarian pasta or  spinach or wheat pasta is OK. Multigrain breads like Arnold's or Pepper[employer], or multigrain sandwich thins or flatbreads.  Diet, exercise and weight loss can reverse and cure diabetes in the early stages.  Diet, exercise and weight loss is very important in the control and prevention of complications of diabetes which affects every system in your body, ie. Brain - dementia/stroke, eyes - glaucoma/blindness, heart - heart attack/heart failure, kidneys - dialysis, stomach - gastric paralysis, intestines - malabsorption, nerves - severe painful neuritis, circulation - gangrene & loss of a leg(s), and finally cancer and Alzheimers.    I recommend avoid fried & greasy foods,  sweets/candy, white rice (brown or wild rice or Quinoa is OK), white potatoes (sweet potatoes are OK) - anything made from white flour - bagels, doughnuts, rolls, buns, biscuits,white and wheat breads, pizza crust and traditional pasta made of white flour & egg white(vegetarian pasta or spinach or wheat pasta is OK).  Multi-grain bread is OK - like multi-grain flat bread or sandwich thins. Avoid alcohol in excess. Exercise is also important.    Eat all the vegetables you want - avoid meat, especially red meat and dairy - especially cheese.  Cheese is the most concentrated form of trans-fats which is the worst thing to clog up our arteries. Veggie cheese is OK which can be found in the fresh produce section at [employer] or [employer] or [employer]  +++++++++++++++++++++ DASH Eating Plan  DASH stands for "Dietary Approaches to Stop Hypertension."   The DASH eating plan is a healthy eating plan that has been shown to reduce high blood pressure (hypertension). Additional health benefits may include reducing the risk of type 2 diabetes mellitus, heart disease, and stroke. The DASH eating plan may also help with weight loss. WHAT DO I NEED TO KNOW ABOUT THE DASH EATING PLAN? For the DASH eating plan, you will follow these general  guidelines:  Choose foods with a percent daily value for sodium of less than 5% (as listed on the food label).  Use salt-free seasonings or herbs instead of table salt or sea salt.  Check with your health care provider or pharmacist before using salt substitutes.  Eat lower-sodium products, often labeled as "lower sodium" or "no salt added."  Eat fresh foods.  Eat more vegetables, fruits, and low-fat dairy products.  Choose whole grains. Look for the word "whole" as the first word in the ingredient list.  Choose fish   Limit sweets, desserts, sugars, and sugary drinks.  Choose heart-healthy fats.  Eat veggie cheese     Eat more home-cooked food and less restaurant, buffet, and fast food.  Limit fried foods.  Cook foods using methods other than frying.  Limit canned vegetables. If you do use them, rinse them well to decrease the sodium.  When eating at a restaurant, ask that your food be prepared with less salt, or no salt if possible.                      WHAT FOODS CAN I EAT? Read Dr Joel Fuhrman's books on The End of Dieting & The End of Diabetes  Grains Whole grain or whole wheat bread. Brown rice. Whole grain or whole wheat pasta. Quinoa, bulgur, and whole grain cereals. Low-sodium cereals. Corn or whole wheat flour tortillas. Whole grain cornbread. Whole grain crackers. Low-sodium crackers.  Vegetables Fresh or frozen vegetables (raw, steamed, roasted, or grilled). Low-sodium or reduced-sodium tomato and vegetable juices. Low-sodium or reduced-sodium tomato sauce and paste. Low-sodium or reduced-sodium canned vegetables.   Fruits All fresh, canned (in natural juice), or frozen fruits.  Protein Products  All fish and seafood.  Dried beans, peas, or lentils. Unsalted nuts and seeds. Unsalted canned beans.  Dairy Low-fat dairy products, such as skim or 1% milk, 2% or reduced-fat cheeses, low-fat ricotta or cottage cheese, or plain low-fat yogurt. Low-sodium or  reduced-sodium cheeses.  Fats and Oils Tub margarines without trans fats. Light or reduced-fat mayonnaise and salad dressings (reduced sodium). Avocado. Safflower, olive, or canola oils. Natural peanut or almond butter.  Other Unsalted popcorn and pretzels. The items listed above may not be a complete list of recommended foods or beverages. Contact your dietitian for more options.  +++++++++++++++  WHAT FOODS ARE NOT RECOMMENDED? Grains/ White flour or wheat flour White bread. White pasta. White rice. Refined cornbread. Bagels and croissants. Crackers that contain trans fat.  Vegetables  Creamed or fried vegetables. Vegetables in a . Regular canned vegetables. Regular canned tomato sauce and paste. Regular tomato and vegetable juices.  Fruits Dried fruits. Canned fruit in light or heavy syrup. Fruit juice.  Meat and Other Protein Products Meat in general - RED meat & White meat.  Fatty cuts of meat. Ribs, chicken wings, all processed meats as bacon, sausage, bologna, salami, fatback, hot dogs, bratwurst and packaged luncheon meats.  Dairy Whole or 2% milk, cream, half-and-half, and cream cheese. Whole-fat or sweetened yogurt. Full-fat cheeses or blue cheese. Non-dairy creamers and whipped toppings. Processed cheese, cheese spreads, or cheese curds.  Condiments Onion and garlic salt, seasoned salt, table salt, and sea salt. Canned and packaged gravies. Worcestershire sauce. Tartar sauce. Barbecue sauce. Teriyaki sauce. Soy sauce, including reduced sodium. Steak sauce. Fish sauce. Oyster sauce. Cocktail sauce. Horseradish. Ketchup and mustard. Meat flavorings and tenderizers. Bouillon cubes. Hot sauce. Tabasco sauce. Marinades. Taco seasonings. Relishes.  Fats and Oils Butter, stick margarine, lard, shortening and bacon fat. Coconut, palm kernel, or palm oils. Regular salad dressings.  Pickles and olives. Salted popcorn and pretzels.  The items listed above may not be a complete  list of foods and beverages to avoid.   

## 2016-07-26 NOTE — Progress Notes (Signed)
[place]     This very nice 76 y.o. MWF presents for post hospital follow up from 4/25-07/16/2016 admission for acute encephalopathy, chest pain, cAfib,  and Headache.  At discharge, patient was felt deconditioned and thus transferred to [facility] for Rehab and Physical Therapy.  Last PT/INR on 4/25 in the hospital was INR 1.34 x and dose of coumadin was increased and on 07/19/2016 at [facility], it was INR 2.4 x.  Patient was contacted post discharge by office staff to assure stability and schedule f/u.      Hospitalization discharge instructions and medications are reconciled with the patient an d her caretaker daughter.      Patient is also followed with Hypertension, Hyperlipidemia, Pre-Diabetes and Vitamin D Deficiency.      Patient is treated for HTN and cAfib since 1999 & BP has been controlled at home. Today's BP is at goal -  100/66. Patient has had no complaints of any cardiac type chest pain, palpitations, dyspnea/orthopnea/PND, dizziness, claudication, or dependent edema.     Hyperlipidemia is controlled with diet & meds. Patient denies myalgias or other med SE's. Last Lipids were at goal albeit elevated Trig's: Lab Results  Component Value Date   CHOL 121 07/09/2016   HDL 28 (L) 07/09/2016   LDLCALC 44 07/09/2016   TRIG 246 (H) 07/09/2016   CHOLHDL 4.3 07/09/2016      Also, the patient has history of PreDiabetes and has had no symptoms of reactive hypoglycemia, diabetic polys, paresthesias or visual blurring.  Last A1c was near goal: Lab Results  Component Value Date   HGBA1C 5.7 (H) 06/16/2016      Further, the patient also has history of Vitamin D Deficiency and supplements vitamin D without any suspected side-effects. Last vitamin D was at goal: Lab Results  Component Value Date   VD25OH 84.1 07/10/2016   Current Outpatient Prescriptions on File Prior to Visit  Medication Sig  . acetaminophen (TYLENOL) 500 MG tablet Take 1,000 mg by mouth 2 (two) times  daily as needed for moderate pain.  Marland Kitchen albuterol (PROVENTIL HFA;VENTOLIN HFA) 108 (90 Base) MCG/ACT inhaler Inhale 2 puffs into the lungs every 4 (four) hours as needed for shortness of breath. Inhale 2 puffs into the lungs 5 minutes apart every 4 to 6 hours as needed to rescue asthma  . allopurinol (ZYLOPRIM) 300 MG tablet TAKE 1 TABLET BY MOUTH EVERY DAY  . ALPRAZolam (XANAX) 0.5 MG tablet Take 0.5 mg by mouth every 8 (eight) hours as needed for anxiety.  Marland Kitchen azelastine (ASTELIN) 0.1 % nasal spray Place 2 sprays into both nostrils 2 (two) times daily. Use in each nostril as directed  . budesonide (PULMICORT) 0.25 MG/2ML nebulizer solution Take 2 mLs (0.25 mg total) by nebulization 2 (two) times daily.  Marland Kitchen buPROPion (WELLBUTRIN XL) 150 MG 24 hr tablet Take 1 tablet (150 mg total) by mouth every morning.  . cetirizine (ZYRTEC) 10 MG tablet TAKE 1 CAPSULE (10 MG TOTAL) BY MOUTH AT BEDTIME.  . digoxin (LANOXIN) 0.125 MG tablet Take 0.0625 mg by mouth daily.  . Fluticasone-Umeclidin-Vilant (TRELEGY ELLIPTA) 100-62.5-25 MCG/INH AEPB Inhale 1 puff into the lungs daily.  . furosemide (LASIX) 40 MG tablet 40mg  twice daily  . gabapentin (NEURONTIN) 300 MG capsule TAKE ONE CAPSULE BY MOUTH 3 TIMES A DAY  . hydroxypropyl methylcellulose / hypromellose (ISOPTO TEARS / GONIOVISC) 2.5 % ophthalmic solution Place 1 drop into both eyes 4 (four) times daily as needed for dry eyes.   Marland Kitchen  levothyroxine (SYNTHROID, LEVOTHROID) 50 MCG tablet TAKE 1 TABLET EVERY DAY  . meclizine (ANTIVERT) 25 MG tablet Take 1 tablet (25 mg total) by mouth 3 (three) times daily as needed for dizziness.  . metoprolol tartrate (LOPRESSOR) 25 MG tablet Take 0.5 tablets (12.5 mg total) by mouth 2 (two) times daily.  . montelukast (SINGULAIR) 10 MG tablet TAKE 1 TABLET EVERY DAY FOR ALLERGIES  . pantoprazole (PROTONIX) 40 MG tablet Take 1 tablet (40 mg total) by mouth daily.  . potassium chloride SA (KLOR-CON M20) 20 MEQ tablet Take 2 tablets (40  mEq total) by mouth daily.  . pravastatin (PRAVACHOL) 40 MG tablet TAKE 1 TABLET BY MOUTH AT BEDTIME FOR CHOLESTEROL  . prochlorperazine (COMPAZINE) 5 MG tablet TAKE 1 TABLET BY MOUTH 3 TIMES A DAY FOR VERTIGO OR NAUSEA  . umeclidinium-vilanterol (ANORO ELLIPTA) 62.5-25 MCG/INH AEPB Inhale 1 puff into the lungs daily.  Marland Kitchen warfarin (COUMADIN) 3 MG tablet Take 3 mg by mouth daily.   No current facility-administered medications on file prior to visit.    Allergies  Allergen Reactions  . Amiodarone Other (See Comments)    PULMONARY TOXICITY  . Diovan [Valsartan] Other (See Comments)    HYPOTENSION  . Doxycycline Diarrhea and Other (See Comments)    VISUAL DISTURBANCE  . Flexeril [Cyclobenzaprine] Other (See Comments)    FATIGUE  . Keflex [Cephalexin] Diarrhea  . Verapamil Other (See Comments)    EDEMA  . Advair Diskus [Fluticasone-Salmeterol] Other (See Comments)    SKIN CHANGES.  Can tolerate low dose of prednisone without complications  . Codeine Other (See Comments)    unknown   PMHx:   Past Medical History:  Diagnosis Date  . Atrial fibrillation ([place])   . Migraine headache   . Osteopenia    Immunization History  Administered Date(s) Administered  . Influenza, High Dose Seasonal PF 12/20/2013, 12/09/2014, 11/14/2015  . Influenza-Unspecified 01/02/2013  . PPD Test 07/17/2016  . Pneumococcal Conjugate-13 01/23/2014  . Pneumococcal-Unspecified 03/15/1993, 05/31/2008  . Td 03/16/2007  . Varicella 02/19/2008   Past Surgical History:  Procedure Laterality Date  . ABDOMINAL AORTIC ENDOVASCULAR STENT GRAFT N/A 04/11/2013   Procedure: ABDOMINAL AORTIC ENDOVASCULAR STENT GRAFT WITH RIGHT FEMORAL PATCH ANGIOPLASTY;  Surgeon: Mal Misty, MD;  Location: [facility];  Service: Vascular;  Laterality: N/A;  . BREAST SURGERY     LEFT BREAST BIOPSY  . broken leg Left 1970s  . CARDIAC CATHETERIZATION    . CHOLECYSTECTOMY  1972  . COLONOSCOPY    . EYE SURGERY Bilateral   . Laser vein  procedure    . REFRACTIVE SURGERY    . TONSILLECTOMY     FHx:    Reviewed / unchanged  SHx:    Reviewed / unchanged  Systems Review:  Constitutional: Denies fever, chills, wt changes, headaches, insomnia, fatigue, night sweats, change in appetite. Eyes: Denies redness, blurred vision, diplopia, discharge, itchy, watery eyes.  ENT: Denies discharge, congestion, post nasal drip, epistaxis, sore throat, earache, hearing loss, dental pain, tinnitus, vertigo, sinus pain, snoring.  CV: Denies chest pain, palpitations, irregular heartbeat, syncope, dyspnea, diaphoresis, orthopnea, PND, claudication or edema. Respiratory: denies cough, dyspnea, DOE, pleurisy, hoarseness, laryngitis, wheezing.  Gastrointestinal: Denies dysphagia, odynophagia, heartburn, reflux, water brash, abdominal pain or cramps, nausea, vomiting, bloating, diarrhea, constipation, hematemesis, melena, hematochezia  or hemorrhoids. Genitourinary: Denies dysuria, frequency, urgency, nocturia, hesitancy, discharge, hematuria or flank pain. Musculoskeletal: Denies arthralgias, myalgias, stiffness, jt. swelling, pain, limping or strain/sprain.  Skin: Denies pruritus, rash, hives, warts,  acne, eczema or change in skin lesion(s). Neuro: No weakness, tremor, incoordination, spasms, paresthesia or pain. Psychiatric: Denies confusion, memory loss or sensory loss. Endo: Denies change in weight, skin or hair change.  Heme/Lymph: No excessive bleeding, bruising or enlarged lymph nodes.  Physical Exam  BP 100/66   Pulse 72   Temp 97.4 F (36.3 C)   Resp 16   Ht 5\' 3"  (1.6 m)   Wt 194 lb 9.6 oz (88.3 kg)   BMI 34.47 kg/m   Appears well nourished, well groomed  and in no distress.  Eyes: PERRLA, EOMs, conjunctiva no swelling or erythema. Sinuses: No frontal/maxillary tenderness ENT/Mouth: EAC's clear, TM's nl w/o erythema, bulging. Nares clear w/o erythema, swelling, exudates. Oropharynx clear without erythema or exudates. Oral  hygiene is good. Tongue normal, non obstructing. Hearing intact.  Neck: Supple. Thyroid nl. Car 2+/2+ without bruits, nodes or JVD. Chest: Respirations nl with BS clear & equal w/o rales, rhonchi, wheezing or stridor.  Cor: Heart sounds soft w/ sl irreg rate and rhythm without sig. murmurs, gallops, clicks or rubs. Peripheral pulses 1(+)   With 1(+) ankle edema.  Abdomen: Soft & bowel sounds normal. Non-tender w/o guarding, rebound, hernias, masses or organomegaly.  Lymphatics: Unremarkable.  Musculoskeletal: Full ROM all peripheral extremities, joint stability, 5/5 strength and normal gait.  Skin: Warm, dry without exposed rashes, lesions or ecchymosis apparent.  Neuro: Cranial nerves intact, reflexes equal bilaterally. Sensory-motor testing grossly intact. Tendon reflexes grossly intact.  Pysch: Alert & oriented x 3.  Insight and judgement nl & appropriate. No ideations.  Assessment and Plan:  1. Acute encephalopathy - resolved   2. Essential hypertension  - Continue medication, monitor blood pressure at home.  - Continue DASH diet. Reminder to go to the ER if any CP,  SOB, nausea, dizziness, severe HA, changes vision/speech,  left arm numbness and tingling and jaw pain.  - CBC with Differential/Platelet - BASIC METABOLIC PANEL WITH GFR  3. Acquired hypothyroidism  - TSH  4. Intrinsic asthma   5. Chronic atrial fibrillation (HCC)  - Protime-INR  6. Long term current use of anticoagulant therapy  - Protime-INR  7. Medication management  - CBC with Differential/Platelet - BASIC METABOLIC PANEL WITH GFR - TSH      Discussed  regular exercise, BP monitoring, weight control to achieve/maintain BMI less than 25 and discussed med and SE's. Recommended labs to assess and monitor clinical status with further disposition pending results of labs. Over 30 minutes of exam, counseling, chart review was performed.

## 2016-07-27 ENCOUNTER — Other Ambulatory Visit: Payer: Self-pay | Admitting: Internal Medicine

## 2016-07-27 LAB — PROTIME-INR
INR: 3.4 — AB
Prothrombin Time: 34.4 s — ABNORMAL HIGH (ref 9.0–11.5)

## 2016-07-27 LAB — BASIC METABOLIC PANEL WITH GFR
BUN: 22 mg/dL (ref 7–25)
CALCIUM: 9.7 mg/dL (ref 8.6–10.4)
CO2: 25 mmol/L (ref 20–31)
Chloride: 100 mmol/L (ref 98–110)
Creat: 1.54 mg/dL — ABNORMAL HIGH (ref 0.60–0.93)
GFR, EST AFRICAN AMERICAN: 38 mL/min — AB (ref >=60)
GFR, Est Non African American: 33 mL/min — ABNORMAL LOW (ref >=60)
GLUCOSE: 85 mg/dL (ref 65–99)
Potassium: 4.6 mmol/L (ref 3.5–5.3)
Sodium: 136 mmol/L (ref 135–146)

## 2016-07-28 ENCOUNTER — Encounter: Payer: Self-pay | Admitting: Gynecology

## 2016-08-03 ENCOUNTER — Telehealth: Payer: Self-pay | Admitting: *Deleted

## 2016-08-03 NOTE — Telephone Encounter (Signed)
Patient called and asked if she should be taking ASA 81 mg now.  Per Dr Melford Aase, she should restart the ASA 81 mg daily.  Patient is aware.

## 2016-08-04 ENCOUNTER — Ambulatory Visit: Payer: Medicare Other | Admitting: Physician Assistant

## 2016-08-04 DIAGNOSIS — H10503 Unspecified blepharoconjunctivitis, bilateral: Secondary | ICD-10-CM | POA: Diagnosis not present

## 2016-08-04 DIAGNOSIS — Z961 Presence of intraocular lens: Secondary | ICD-10-CM | POA: Diagnosis not present

## 2016-08-04 DIAGNOSIS — H04123 Dry eye syndrome of bilateral lacrimal glands: Secondary | ICD-10-CM | POA: Diagnosis not present

## 2016-08-15 NOTE — Progress Notes (Signed)
Cardiology Office Note Date:  08/16/2016  Patient ID:  Katie [name], Katie [name] Dec 18, 1940, MRN [id number] PCP:  [name], MD  Cardiologist:  Dr. Lovena Le    Chief Complaint: hospital f/u  History of Present Illness: Katie [name] is a 76 y.o. female with history of chronic CHF (diastolic/systolic), CAD, AAA repaired(follows with vascular, her MD recently retired she cancnot recall the name of the new MD), CRI (III), pulmonary fibrosis, permanent AF, and HTN.  She comes in today to sbe seen for Dr. Lovena Le, last seen by him in Nov 2017, at that time doing well, noting dietary indescretions, no changes to her therapy.  Most recently she was at [facility] with CHF exacerbation s/p IVF in ER and c/o CP.  Her initial presentation was of some degree of confusion r/o for acute CVA with neuro recommendations to repeat MRI in 3 months to f/u on MRI findings on 4/25 showed a 9 mm left cerebellar focus of demyelination, etiology of her initial encephalopathy was unclear.  She had an echo with EF 45-50%, new WMA, though no ischemic w/u pursued, felt 2/2 acute CHF.  There was observed hemoptysis, she was cleared by pulmonary to resume her warfarin.  She was discharged 07/16/16.  She is home from rehab and doing well, says she is 100% back to herself and feeling well though remains with some LE swelling.  She says for the last year or so she had had trouble with swelling, that her weight without swelling is more about 189-190lbs.  She reports a constant central chest heaviness that never goes away for the last 14 years, otherwise she denies any CP, no SOB, no dizziness, near syncope or syncope.  She has been instructed to f/u with neurology but has not yet.  She denies any hemoptysis, no bleeding or signs of bleeding.  Past Medical History:  Diagnosis Date  . Atrial fibrillation ([place])   . Migraine headache   . Osteopenia     Past Surgical History:  Procedure Laterality Date  . ABDOMINAL AORTIC ENDOVASCULAR STENT  GRAFT N/A 04/11/2013   Procedure: ABDOMINAL AORTIC ENDOVASCULAR STENT GRAFT WITH RIGHT FEMORAL PATCH ANGIOPLASTY;  Surgeon: Mal Misty, MD;  Location: [place];  Service: Vascular;  Laterality: N/A;  . BREAST SURGERY     LEFT BREAST BIOPSY  . broken leg Left 1970s  . CARDIAC CATHETERIZATION    . CHOLECYSTECTOMY  1972  . COLONOSCOPY    . EYE SURGERY Bilateral   . Laser vein procedure    . REFRACTIVE SURGERY    . TONSILLECTOMY      Current Outpatient Prescriptions  Medication Sig Dispense Refill  . acetaminophen (TYLENOL) 500 MG tablet Take 1,000 mg by mouth 2 (two) times daily as needed for moderate pain.    Marland Kitchen albuterol (PROVENTIL HFA;VENTOLIN HFA) 108 (90 Base) MCG/ACT inhaler Inhale 2 puffs into the lungs every 4 (four) hours as needed for shortness of breath. Inhale 2 puffs into the lungs 5 minutes apart every 4 to 6 hours as needed to rescue asthma  0  . allopurinol (ZYLOPRIM) 300 MG tablet TAKE 1 TABLET BY MOUTH EVERY DAY 90 tablet 1  . ALPRAZolam (XANAX) 0.5 MG tablet Take 0.5 mg by mouth every 8 (eight) hours as needed for anxiety.    Marland Kitchen azelastine (ASTELIN) 0.1 % nasal spray Place 2 sprays into both nostrils 2 (two) times daily. Use in each nostril as directed (Patient taking differently: Place 2 sprays into both nostrils as needed. Use  in each nostril as directed) 30 mL 2  . budesonide (PULMICORT) 0.25 MG/2ML nebulizer solution Take 2 mLs (0.25 mg total) by nebulization 2 (two) times daily. 60 mL 12  . cetirizine (ZYRTEC) 10 MG tablet TAKE 1 CAPSULE (10 MG TOTAL) BY MOUTH AT BEDTIME. 90 tablet 3  . digoxin (LANOXIN) 0.25 MG tablet TAKE 1 TABLET BY MOUTH EVERY DAY 90 tablet 1  . diltiazem (CARDIZEM CD) 120 MG 24 hr capsule Take 1 capsule (120 mg total) by mouth daily. 30 capsule 0  . Fluticasone-Umeclidin-Vilant (TRELEGY ELLIPTA) 100-62.5-25 MCG/INH AEPB Inhale 1 puff into the lungs daily. 60 each 3  . furosemide (LASIX) 40 MG tablet 40mg  twice daily    . gabapentin (NEURONTIN) 300  MG capsule TAKE ONE CAPSULE BY MOUTH 3 TIMES A DAY 270 capsule 0  . hydroxypropyl methylcellulose / hypromellose (ISOPTO TEARS / GONIOVISC) 2.5 % ophthalmic solution Place 1 drop into both eyes 4 (four) times daily as needed for dry eyes.     Marland Kitchen levothyroxine (SYNTHROID, LEVOTHROID) 50 MCG tablet TAKE 1 TABLET EVERY DAY (Patient taking differently: TAKE 25 MG THREE A WEEK THEN TAKE 50 MG 4 TIME A  WEEK) 90 tablet 2  . meclizine (ANTIVERT) 25 MG tablet Take 1 tablet (25 mg total) by mouth 3 (three) times daily as needed for dizziness. 60 tablet 1  . montelukast (SINGULAIR) 10 MG tablet TAKE 1 TABLET EVERY DAY FOR ALLERGIES 90 tablet 1  . pantoprazole (PROTONIX) 40 MG tablet Take 1 tablet (40 mg total) by mouth daily.    . potassium chloride SA (KLOR-CON M20) 20 MEQ tablet Take 2 tablets (40 mEq total) by mouth daily. 180 tablet 1  . pravastatin (PRAVACHOL) 40 MG tablet TAKE 1 TABLET BY MOUTH AT BEDTIME FOR CHOLESTEROL 90 tablet 1  . prochlorperazine (COMPAZINE) 5 MG tablet TAKE 1 TABLET BY MOUTH 3 TIMES A DAY FOR VERTIGO OR NAUSEA (Patient taking differently: TAKE AS NEEDED) 90 tablet 1  . umeclidinium-vilanterol (ANORO ELLIPTA) 62.5-25 MCG/INH AEPB Inhale 1 puff into the lungs daily.    Marland Kitchen warfarin (COUMADIN) 3 MG tablet Take 3 mg by mouth daily.     No current facility-administered medications for this visit.     Allergies:   Amiodarone; Diovan [valsartan]; Doxycycline; Flexeril [cyclobenzaprine]; Keflex [cephalexin]; Verapamil; Advair diskus [fluticasone-salmeterol]; and Codeine   Social History:  The patient  reports that she quit smoking about 14 years ago. Her smoking use included Cigarettes. She has never used smokeless tobacco. She reports that she drinks alcohol. She reports that she does not use drugs.   Family History:  The patient's family history includes Alcohol abuse in her father; Breast cancer in her sister; Cancer (age of onset: 79) in her mother; Cirrhosis in her mother; Depression  in her father; Heart defect in her sister; Heart disease in her daughter and sister; Hyperlipidemia in her son; Hypertension in her brother; Stroke in her sister.  ROS:  Please see the history of present illness.  All other systems are reviewed and otherwise negative.   PHYSICAL EXAM:  VS:  BP 118/68   Pulse (!) 55   Ht 5\' 3"  (1.6 m)   Wt 198 lb (89.8 kg)   BMI 35.07 kg/m  BMI: Body mass index is 35.07 kg/m. Well nourished, well developed, in no acute distress  HEENT: normocephalic, atraumatic  Neck: no JVD, carotid bruits or masses Cardiac:  IRRR RRR; no significant murmurs, no rubs, or gallops Lungs: CTA b/l, no wheezing, rhonchi or  rales  Abd: soft, nontender MS: no deformity or atrophy Ext: 1+ edema  Skin: warm and dry, no rash Neuro:  No gross deficits appreciated Psych: euthymic mood, full affect   EKG:  Done 07/11/16 was AFib, 85bpm, LBBB-like morphology QRS 167ms  07/10/16: TTE Study Conclusions - Left ventricle: inferior wall hypokinesis. Systolic function was   mildly reduced. The estimated ejection fraction was in the range   of 45% to 50%. - Aortic valve: There was mild regurgitation. Valve area (VTI):   1.53 cm^2. Valve area (Vmax): 1.58 cm^2. Valve area (Vmean): 1.57   cm^2. - Mitral valve: There was mild regurgitation. - Left atrium: The atrium was mildly dilated. - Right atrium: The atrium was mildly dilated. - Atrial septum: No defect or patent foramen ovale was identified. - Tricuspid valve: There was moderate regurgitation. - Pericardium, extracardiac: A trivial pericardial effusion was   identified posterior to the heart.  09/25/12: Lexiscan stress test Impression: Abnormal myovue with small anterior and septal wall infarct no ischemia EF 50% with apical hypokinesis  EF may not be accurate due to afib  07/28/10: LHC  IMPRESSION:  1. Moderately decreased left ventricular systolic function with global     hypokinesis consistent with a nonischemic  cardiomyopathy.  2. Mild-to-moderate mitral regurgitation.  3. Moderate, but nonobstructive coronary artery disease.  Recent Labs: 07/07/2016: B Natriuretic Peptide 60.2 07/09/2016: Magnesium 2.1 07/11/2016: ALT 22 07/26/2016: BUN 22; Creat 1.54; Hemoglobin 13.9; Platelets 425; Potassium 4.6; Sodium 136; TSH 5.98  07/09/2016: Cholesterol 121; HDL 28; LDL Cholesterol 44; Total CHOL/HDL Ratio 4.3; Triglycerides 246; VLDL 49   CrCl cannot be calculated (Patient's most recent lab result is older than the maximum 21 days allowed.).   Wt Readings from Last 3 Encounters:  08/16/16 198 lb (89.8 kg)  07/26/16 194 lb 9.6 oz (88.3 kg)  07/23/16 197 lb 3.2 oz (89.4 kg)     Other studies reviewed: Additional studies/records reviewed today include: summarized above  ASSESSMENT AND PLAN:  1. Chronic CHF (diastolic/systolic)     Recent acute exacerbation, 2/2 ?IVF     She has 1+ edema today, though her daughter has been hospitalized and has been spending a significant amount of time there, trying to put her feet up when she can but spending more then usual time seated in a chair.       We also discussed sodium and water intake, she tends to eat deli sandwiches, water intake sounds reasonable        2. CP/CAD     Mild abn Trop, flat trend, IW hypokinesis on echo     Cardiology service felt likely 2/2 acute CHF and felt no ischemic w/u was needed     She has not had any anginal symptoms, will monitor     She reports her lipids are check by her PMD and "very good"  3. HTN     Looks good, no changes  4. Permanent AFib     CHA2DS2Vasc is at least 4, 5 with CM, on warfarin     Warfarin is monitored and managed with her PMD     Allergy lists amiodarone 2/2 lung tox    Disposition: Add Lasix 20mg  mid-day for 3 days then resume her BID schedule, try to elevate legs when seated, daily weights and if no improvement in her swelling to let us know, she will evaluate her diet and minimize her sodium  intake as well.    Current medicines are reviewed at length with  the patient today.  The patient did not have any concerns regarding medicines.  Venetia Night, PA-C 08/16/2016 1:47 PM     [address] [place] [address] [phone] (office)  [phone] (fax)

## 2016-08-16 ENCOUNTER — Ambulatory Visit (INDEPENDENT_AMBULATORY_CARE_PROVIDER_SITE_OTHER): Payer: Medicare Other | Admitting: Physician Assistant

## 2016-08-16 VITALS — BP 118/68 | HR 55 | Ht 63.0 in | Wt 198.0 lb

## 2016-08-16 DIAGNOSIS — I2510 Atherosclerotic heart disease of native coronary artery without angina pectoris: Secondary | ICD-10-CM

## 2016-08-16 DIAGNOSIS — I482 Chronic atrial fibrillation: Secondary | ICD-10-CM | POA: Diagnosis not present

## 2016-08-16 DIAGNOSIS — I428 Other cardiomyopathies: Secondary | ICD-10-CM | POA: Diagnosis not present

## 2016-08-16 DIAGNOSIS — I5042 Chronic combined systolic (congestive) and diastolic (congestive) heart failure: Secondary | ICD-10-CM

## 2016-08-16 DIAGNOSIS — I4821 Permanent atrial fibrillation: Secondary | ICD-10-CM

## 2016-08-16 NOTE — Patient Instructions (Addendum)
Medication Instructions:   FOR 3 DAYS ONLY  TAKE YOUR LASIX 40 MG AS :  1.  40 MG  IN THE AM    2.  20 MG MIDDAY   3.  40 MG IN PM   THEN RESUME TAKING LASIX 40 MG TWICE A DAY   If you need a refill on your cardiac medications before your next appointment, please call your pharmacy.  Labwork: NONE ORDERED  TODAY    Testing/Procedures: NONE ORDERED  TODAY    Follow-Up: IN 4 MONTHS WITH DR Lovena Le OR RENEE URSUY    Any Other Special Instructions Will Be Listed Below (If Applicable).  AVOID SALTY FOODS  WEIGH  YOURSELF DAILY AND CALL CLINIC IS YOUR SWELLING HAS NOT IMPROVED

## 2016-08-20 ENCOUNTER — Encounter: Payer: Self-pay | Admitting: Internal Medicine

## 2016-08-20 ENCOUNTER — Ambulatory Visit (INDEPENDENT_AMBULATORY_CARE_PROVIDER_SITE_OTHER): Payer: Medicare Other | Admitting: Internal Medicine

## 2016-08-20 VITALS — BP 138/64 | HR 80 | Temp 98.1°F | Resp 16 | Ht 63.0 in | Wt 198.6 lb

## 2016-08-20 DIAGNOSIS — E782 Mixed hyperlipidemia: Secondary | ICD-10-CM

## 2016-08-20 DIAGNOSIS — Z79899 Other long term (current) drug therapy: Secondary | ICD-10-CM | POA: Diagnosis not present

## 2016-08-20 DIAGNOSIS — E559 Vitamin D deficiency, unspecified: Secondary | ICD-10-CM | POA: Diagnosis not present

## 2016-08-20 DIAGNOSIS — R7303 Prediabetes: Secondary | ICD-10-CM | POA: Diagnosis not present

## 2016-08-20 DIAGNOSIS — I482 Chronic atrial fibrillation, unspecified: Secondary | ICD-10-CM

## 2016-08-20 DIAGNOSIS — F32A Depression, unspecified: Secondary | ICD-10-CM

## 2016-08-20 DIAGNOSIS — Z7901 Long term (current) use of anticoagulants: Secondary | ICD-10-CM | POA: Diagnosis not present

## 2016-08-20 DIAGNOSIS — E039 Hypothyroidism, unspecified: Secondary | ICD-10-CM

## 2016-08-20 DIAGNOSIS — I10 Essential (primary) hypertension: Secondary | ICD-10-CM

## 2016-08-20 DIAGNOSIS — F329 Major depressive disorder, single episode, unspecified: Secondary | ICD-10-CM

## 2016-08-20 LAB — CBC WITH DIFFERENTIAL/PLATELET
BASOS PCT: 0 %
Basophils Absolute: 0 cells/uL (ref 0–200)
EOS ABS: 210 {cells}/uL (ref 15–500)
Eosinophils Relative: 3 %
HEMATOCRIT: 45.5 % — AB (ref 35.0–45.0)
HEMOGLOBIN: 14.5 g/dL (ref 11.7–15.5)
LYMPHS ABS: 1190 {cells}/uL (ref 850–3900)
Lymphocytes Relative: 17 %
MCH: 29.2 pg (ref 27.0–33.0)
MCHC: 31.9 g/dL — ABNORMAL LOW (ref 32.0–36.0)
MCV: 91.5 fL (ref 80.0–100.0)
MONO ABS: 350 {cells}/uL (ref 200–950)
MPV: 10.7 fL (ref 7.5–12.5)
Monocytes Relative: 5 %
Neutro Abs: 5250 cells/uL (ref 1500–7800)
Neutrophils Relative %: 75 %
Platelets: 245 10*3/uL (ref 140–400)
RBC: 4.97 MIL/uL (ref 3.80–5.10)
RDW: 16.4 % — ABNORMAL HIGH (ref 11.0–15.0)
WBC: 7 10*3/uL (ref 3.8–10.8)

## 2016-08-20 NOTE — Patient Instructions (Signed)
Recommend Adult Low Dose Aspirin or  coated  Aspirin 81 mg daily  To reduce risk of Colon Cancer 20 %,  Skin Cancer 26 % ,  Melanoma 46%  and  Pancreatic cancer 60% +++++++++++++++++++++++++ Vitamin D goal  is between 70-100.  Please make sure that you are taking your Vitamin D as directed.  It is very important as a natural anti-inflammatory  helping hair, skin, and nails, as well as reducing stroke and heart attack risk.  It helps your bones and helps with mood. It also decreases numerous cancer risks so please take it as directed.  Low Vit D is associated with a 200-300% higher risk for CANCER  and 200-300% higher risk for HEART   ATTACK  &  STROKE.   ...................................... It is also associated with higher death rate at younger ages,  autoimmune diseases like Rheumatoid arthritis, Lupus, Multiple Sclerosis.    Also many other serious conditions, like depression, Alzheimer's Dementia, infertility, muscle aches, fatigue, fibromyalgia - just to name a few. ++++++++++++++++++++ Recommend the book "The END of DIETING" by Dr Joel Fuhrman  & the book "The END of DIABETES " by Dr Joel Fuhrman At [employer] - get book & Audio CD's    Being diabetic has a  300% increased risk for heart attack, stroke, cancer, and alzheimer- type vascular dementia. It is very important that you work harder with diet by avoiding all foods that are white. Avoid white rice (brown & wild rice is OK), white potatoes (sweetpotatoes in moderation is OK), White bread or wheat bread or anything made out of white flour like bagels, donuts, rolls, buns, biscuits, cakes, pastries, cookies, pizza crust, and pasta (made from white flour & egg whites) - vegetarian pasta or spinach or wheat pasta is OK. Multigrain breads like Arnold's or Pepperidge [employer], or multigrain sandwich thins or flatbreads.  Diet, exercise and weight loss can reverse and cure diabetes in the early stages.  Diet, exercise and weight loss is  very important in the control and prevention of complications of diabetes which affects every system in your body, ie. Brain - dementia/stroke, eyes - glaucoma/blindness, heart - heart attack/heart failure, kidneys - dialysis, stomach - gastric paralysis, intestines - malabsorption, nerves - severe painful neuritis, circulation - gangrene & loss of a leg(s), and finally cancer and Alzheimers.    I recommend avoid fried & greasy foods,  sweets/candy, white rice (brown or wild rice or Quinoa is OK), white potatoes (sweet potatoes are OK) - anything made from white flour - bagels, doughnuts, rolls, buns, biscuits,white and wheat breads, pizza crust and traditional pasta made of white flour & egg white(vegetarian pasta or spinach or wheat pasta is OK).  Multi-grain bread is OK - like multi-grain flat bread or sandwich thins. Avoid alcohol in excess. Exercise is also important.    Eat all the vegetables you want - avoid meat, especially red meat and dairy - especially cheese.  Cheese is the most concentrated form of trans-fats which is the worst thing to clog up our arteries. Veggie cheese is OK which can be found in the fresh produce section at [employer] or [employer] or [employer]  +++++++++++++++++++++ DASH Eating Plan  DASH stands for "Dietary Approaches to Stop Hypertension."   The DASH eating plan is a healthy eating plan that has been shown to reduce high blood pressure (hypertension). Additional health benefits may include reducing the risk of type 2 diabetes mellitus, heart disease, and stroke. The DASH eating plan may also   help with weight loss. WHAT DO I NEED TO KNOW ABOUT THE DASH EATING PLAN? For the DASH eating plan, you will follow these general guidelines:  Choose foods with a percent daily value for sodium of less than 5% (as listed on the food label).  Use salt-free seasonings or herbs instead of table salt or sea salt.  Check with your health care provider or pharmacist before  using salt substitutes.  Eat lower-sodium products, often labeled as "lower sodium" or "no salt added."  Eat fresh foods.  Eat more vegetables, fruits, and low-fat dairy products.  Choose whole grains. Look for the word "whole" as the first word in the ingredient list.  Choose fish   Limit sweets, desserts, sugars, and sugary drinks.  Choose heart-healthy fats.  Eat veggie cheese   Eat more home-cooked food and less restaurant, buffet, and fast food.  Limit fried foods.  Cook foods using methods other than frying.  Limit canned vegetables. If you do use them, rinse them well to decrease the sodium.  When eating at a restaurant, ask that your food be prepared with less salt, or no salt if possible.                      WHAT FOODS CAN I EAT? Read Dr Joel Fuhrman's books on The End of Dieting & The End of Diabetes  Grains Whole grain or whole wheat bread. Brown rice. Whole grain or whole wheat pasta. Quinoa, bulgur, and whole grain cereals. Low-sodium cereals. Corn or whole wheat flour tortillas. Whole grain cornbread. Whole grain crackers. Low-sodium crackers.  Vegetables Fresh or frozen vegetables (raw, steamed, roasted, or grilled). Low-sodium or reduced-sodium tomato and vegetable juices. Low-sodium or reduced-sodium tomato sauce and paste. Low-sodium or reduced-sodium canned vegetables.   Fruits All fresh, canned (in natural juice), or frozen fruits.  Protein Products  All fish and seafood.  Dried beans, peas, or lentils. Unsalted nuts and seeds. Unsalted canned beans.  Dairy Low-fat dairy products, such as skim or 1% milk, 2% or reduced-fat cheeses, low-fat ricotta or cottage cheese, or plain low-fat yogurt. Low-sodium or reduced-sodium cheeses.  Fats and Oils Tub margarines without trans fats. Light or reduced-fat mayonnaise and salad dressings (reduced sodium). Avocado. Safflower, olive, or canola oils. Natural peanut or almond butter.  Other Unsalted popcorn  and pretzels. The items listed above may not be a complete list of recommended foods or beverages. Contact your dietitian for more options.  +++++++++++++++  WHAT FOODS ARE NOT RECOMMENDED? Grains/ White flour or wheat flour White bread. White pasta. White rice. Refined cornbread. Bagels and croissants. Crackers that contain trans fat.  Vegetables  Creamed or fried vegetables. Vegetables in a . Regular canned vegetables. Regular canned tomato sauce and paste. Regular tomato and vegetable juices.  Fruits Dried fruits. Canned fruit in light or heavy syrup. Fruit juice.  Meat and Other Protein Products Meat in general - RED meat & White meat.  Fatty cuts of meat. Ribs, chicken wings, all processed meats as bacon, sausage, bologna, salami, fatback, hot dogs, bratwurst and packaged luncheon meats.  Dairy Whole or 2% milk, cream, half-and-half, and cream cheese. Whole-fat or sweetened yogurt. Full-fat cheeses or blue cheese. Non-dairy creamers and whipped toppings. Processed cheese, cheese spreads, or cheese curds.  Condiments Onion and garlic salt, seasoned salt, table salt, and sea salt. Canned and packaged gravies. Worcestershire sauce. Tartar sauce. Barbecue sauce. Teriyaki sauce. Soy sauce, including reduced sodium. Steak sauce. Fish sauce. Oyster sauce. Cocktail   sauce. Horseradish. Ketchup and mustard. Meat flavorings and tenderizers. Bouillon cubes. Hot sauce. Tabasco sauce. Marinades. Taco seasonings. Relishes.  Fats and Oils Butter, stick margarine, lard, shortening and bacon fat. Coconut, palm kernel, or palm oils. Regular salad dressings.  Pickles and olives. Salted popcorn and pretzels.  The items listed above may not be a complete list of foods and beverages to avoid. =a

## 2016-08-20 NOTE — Progress Notes (Signed)
  Subjective:    Patient ID: Katie [name], female    DOB: January 21, 1941, 76 y.o.   MRN: [id number]  HPI  Patient is a nice 76 yo MWF with HTN, cAfib on Coumadin for F/U. PT/INR was elevated at last OV 5/14 and coumadin was tapered. Thyroid was also sl  low and dose was adjusted .    Patient denies any HA, dizziness, CP, palpitations but does have c/o DOE but bo Orth/PND. Recently lasix dose was increased from bid to TID at her Cardiologist's office. Denies any GI, GU or Respiratory sx's.   Review of Systems  10 point systems review negative except as above.    Objective:   Physical Exam  BP 138/64   Pulse 80   Temp 98.1 F (36.7 C)   Resp 16   Ht 5\' 3"  (1.6 m)   Wt 198 lb 9.6 oz (90.1 kg)   BMI 35.18 kg/m   HEENT - Eac's patent. TM's Nl. EOM's full. PERRLA. NasoOroPharynx clear. Neck - supple. Nl Thyroid. Carotids 2+ & No bruits, nodes, JVD Chest - Clear equal BS w/o Rales, rhonchi, wheezes. Cor - Nl HS. RRR w/o sig MGR. PP 1(+). 1(+) edema. MS- FROM w/o deformities. Muscle power, tone and bulk Nl. Gait Nl. Neuro - No obvious Cr N abnormalities. Sensory, motor and Cerebellar functions appear Nl w/o focal abnormalities.   Assessment & Plan:   1. Essential hypertension  - CBC with Differential/Platelet - BASIC METABOLIC PANEL WITH GFR  2. Hyperlipidemia, mixed   3. Prediabetes   4. Vitamin D deficiency   5. Chronic atrial fibrillation (HCC)  - Protime-INR  6. Depression, controlled  - buPROPion (WELLBUTRIN XL) 150 MG 24 hr tablet;   7. Hypothyroidism  -TSH  8. Long term current use of anticoagulant therapy  - Protime-INR  9. Medication management  - CBC with Differential/Platelet - BASIC METABOLIC PANEL WITH GFR

## 2016-08-21 LAB — BASIC METABOLIC PANEL WITH GFR
BUN: 19 mg/dL (ref 7–25)
CHLORIDE: 105 mmol/L (ref 98–110)
CO2: 24 mmol/L (ref 20–31)
Calcium: 9.4 mg/dL (ref 8.6–10.4)
Creat: 1.26 mg/dL — ABNORMAL HIGH (ref 0.60–0.93)
GFR, EST AFRICAN AMERICAN: 48 mL/min — AB (ref >=60)
GFR, EST NON AFRICAN AMERICAN: 42 mL/min — AB (ref >=60)
GLUCOSE: 110 mg/dL — AB (ref 65–99)
POTASSIUM: 3.9 mmol/L (ref 3.5–5.3)
Sodium: 141 mmol/L (ref 135–146)

## 2016-08-21 LAB — PROTIME-INR
INR: 1.2 — ABNORMAL HIGH
Prothrombin Time: 12.7 s — ABNORMAL HIGH (ref 9.0–11.5)

## 2016-08-23 ENCOUNTER — Telehealth: Payer: Self-pay | Admitting: Physician Assistant

## 2016-08-23 NOTE — Telephone Encounter (Signed)
Patient called to report swelling in her feet and ankles for a few months. She has been taking Lasix 40 qAM, 20 mg at lunch, and 40 qPM.  She states her swelling was pretty well controlled until yesterday when she ate a lot of potato chips. She weighs herself daily and hasn't gained any weight.  She understands to limit her salt and elevate her legs when sitting. She has compression stockings but does not know where they are to wear them. She denies SOB, CP, palpitations. She will continue to weigh daily and call if she gains 3 pounds in a day or 5 pounds in a week. She understands she will be called with further medication instructions (she was supposed to take Lasix TID for only 3 days then resume 40 mg BID).

## 2016-08-23 NOTE — Telephone Encounter (Signed)
New Message      Pt c/o swelling: STAT is pt has developed SOB within 24 hours  1. How long have you been experiencing swelling? 6 months  2. Where is the swelling located? Feet   3.  Are you currently taking a "fluid pill"? Yes and Renee raised the dosage and it went down by almost 50% but she is still swollen   4.  Are you currently SOB?  no  5.  Have you traveled recently? no

## 2016-08-24 NOTE — Telephone Encounter (Signed)
-----   Message from [place], [place] sent at 08/24/2016  9:58 AM EDT ----- Please advise to continue the 20mg  mid-day ;asix doseuntil swelling improves again, then resume usual BID dose, yes, please have her come in for BMET.  Also again remind her the importance of avoiding those salty foods.  Send my regards, I hope her daughter is doing better.  Renee

## 2016-08-24 NOTE — Telephone Encounter (Signed)
Informed patient she may continue Lasix 40 mg - 20 mg - 40 mg daily until swelling improves again then to resume 40 mg BID.  Reiterated to patient the importance of limiting salty foods. She is having difficulty eating well as she stayed at the hospital last night with her daughter. She has monthly lab work drawn at her PCP - last creatinine 6/8 was 1.26 (down from 1.54 one month ago). She has repeat labs due 7/6.

## 2016-08-26 ENCOUNTER — Other Ambulatory Visit: Payer: Self-pay | Admitting: *Deleted

## 2016-08-26 ENCOUNTER — Other Ambulatory Visit: Payer: Self-pay | Admitting: Internal Medicine

## 2016-08-26 MED ORDER — GABAPENTIN 300 MG PO CAPS: 300.0000 mg | ORAL_CAPSULE | Freq: Three times a day (TID) | ORAL | 0 refills | Status: DC

## 2016-08-27 ENCOUNTER — Other Ambulatory Visit: Payer: Self-pay | Admitting: Internal Medicine

## 2016-09-03 ENCOUNTER — Other Ambulatory Visit: Payer: Self-pay | Admitting: Internal Medicine

## 2016-09-17 ENCOUNTER — Encounter: Payer: Self-pay | Admitting: Physician Assistant

## 2016-09-17 ENCOUNTER — Ambulatory Visit (INDEPENDENT_AMBULATORY_CARE_PROVIDER_SITE_OTHER): Payer: Medicare Other | Admitting: Physician Assistant

## 2016-09-17 VITALS — BP 118/66 | HR 81 | Temp 98.1°F | Resp 16 | Ht 63.0 in | Wt 195.0 lb

## 2016-09-17 DIAGNOSIS — R6889 Other general symptoms and signs: Secondary | ICD-10-CM

## 2016-09-17 DIAGNOSIS — I950 Idiopathic hypotension: Secondary | ICD-10-CM

## 2016-09-17 DIAGNOSIS — N183 Chronic kidney disease, stage 3 unspecified: Secondary | ICD-10-CM

## 2016-09-17 DIAGNOSIS — E559 Vitamin D deficiency, unspecified: Secondary | ICD-10-CM

## 2016-09-17 DIAGNOSIS — I5022 Chronic systolic (congestive) heart failure: Secondary | ICD-10-CM | POA: Diagnosis not present

## 2016-09-17 DIAGNOSIS — I2510 Atherosclerotic heart disease of native coronary artery without angina pectoris: Secondary | ICD-10-CM | POA: Diagnosis not present

## 2016-09-17 DIAGNOSIS — Z0001 Encounter for general adult medical examination with abnormal findings: Secondary | ICD-10-CM

## 2016-09-17 DIAGNOSIS — Z7901 Long term (current) use of anticoagulants: Secondary | ICD-10-CM

## 2016-09-17 DIAGNOSIS — N179 Acute kidney failure, unspecified: Secondary | ICD-10-CM | POA: Diagnosis not present

## 2016-09-17 DIAGNOSIS — K219 Gastro-esophageal reflux disease without esophagitis: Secondary | ICD-10-CM

## 2016-09-17 DIAGNOSIS — R7309 Other abnormal glucose: Secondary | ICD-10-CM

## 2016-09-17 DIAGNOSIS — I5042 Chronic combined systolic (congestive) and diastolic (congestive) heart failure: Secondary | ICD-10-CM

## 2016-09-17 DIAGNOSIS — Z79899 Other long term (current) drug therapy: Secondary | ICD-10-CM

## 2016-09-17 DIAGNOSIS — E785 Hyperlipidemia, unspecified: Secondary | ICD-10-CM | POA: Diagnosis not present

## 2016-09-17 DIAGNOSIS — I714 Abdominal aortic aneurysm, without rupture, unspecified: Secondary | ICD-10-CM

## 2016-09-17 DIAGNOSIS — J8410 Pulmonary fibrosis, unspecified: Secondary | ICD-10-CM | POA: Diagnosis not present

## 2016-09-17 DIAGNOSIS — I482 Chronic atrial fibrillation: Secondary | ICD-10-CM | POA: Diagnosis not present

## 2016-09-17 DIAGNOSIS — G9340 Encephalopathy, unspecified: Secondary | ICD-10-CM

## 2016-09-17 DIAGNOSIS — J9601 Acute respiratory failure with hypoxia: Secondary | ICD-10-CM

## 2016-09-17 DIAGNOSIS — J449 Chronic obstructive pulmonary disease, unspecified: Secondary | ICD-10-CM

## 2016-09-17 DIAGNOSIS — I4821 Permanent atrial fibrillation: Secondary | ICD-10-CM

## 2016-09-17 DIAGNOSIS — I10 Essential (primary) hypertension: Secondary | ICD-10-CM

## 2016-09-17 DIAGNOSIS — N189 Chronic kidney disease, unspecified: Secondary | ICD-10-CM

## 2016-09-17 DIAGNOSIS — M8580 Other specified disorders of bone density and structure, unspecified site: Secondary | ICD-10-CM

## 2016-09-17 DIAGNOSIS — I739 Peripheral vascular disease, unspecified: Secondary | ICD-10-CM

## 2016-09-17 DIAGNOSIS — E039 Hypothyroidism, unspecified: Secondary | ICD-10-CM

## 2016-09-17 DIAGNOSIS — G43909 Migraine, unspecified, not intractable, without status migrainosus: Secondary | ICD-10-CM | POA: Diagnosis not present

## 2016-09-17 DIAGNOSIS — Z0000 Encounter for general adult medical examination without abnormal findings: Secondary | ICD-10-CM

## 2016-09-17 LAB — CBC WITH DIFFERENTIAL/PLATELET
BASOS ABS: 0 {cells}/uL (ref 0–200)
Basophils Relative: 0 %
EOS ABS: 214 {cells}/uL (ref 15–500)
Eosinophils Relative: 2 %
HEMATOCRIT: 45.5 % — AB (ref 35.0–45.0)
HEMOGLOBIN: 15 g/dL (ref 11.7–15.5)
Lymphocytes Relative: 18 %
Lymphs Abs: 1926 cells/uL (ref 850–3900)
MCH: 30.2 pg (ref 27.0–33.0)
MCHC: 33 g/dL (ref 32.0–36.0)
MCV: 91.5 fL (ref 80.0–100.0)
MONO ABS: 1177 {cells}/uL — AB (ref 200–950)
MONOS PCT: 11 %
MPV: 10.2 fL (ref 7.5–12.5)
NEUTROS ABS: 7383 {cells}/uL (ref 1500–7800)
Neutrophils Relative %: 69 %
PLATELETS: 255 10*3/uL (ref 140–400)
RBC: 4.97 MIL/uL (ref 3.80–5.10)
RDW: 15.4 % — ABNORMAL HIGH (ref 11.0–15.0)
WBC: 10.7 10*3/uL (ref 3.8–10.8)

## 2016-09-17 LAB — LIPID PANEL
Cholesterol: 148 mg/dL (ref <200)
HDL: 29 mg/dL — ABNORMAL LOW (ref >50)
LDL CALC: 79 mg/dL (ref <100)
Total CHOL/HDL Ratio: 5.1 Ratio — ABNORMAL HIGH (ref <5.0)
Triglycerides: 200 mg/dL — ABNORMAL HIGH (ref <150)
VLDL: 40 mg/dL — ABNORMAL HIGH (ref <30)

## 2016-09-17 LAB — HEPATIC FUNCTION PANEL
ALT: 11 U/L (ref 6–29)
AST: 16 U/L (ref 10–35)
Albumin: 4.2 g/dL (ref 3.6–5.1)
Alkaline Phosphatase: 41 U/L (ref 33–130)
Bilirubin, Direct: 0.2 mg/dL
Indirect Bilirubin: 0.5 mg/dL (ref 0.2–1.2)
Total Bilirubin: 0.7 mg/dL (ref 0.2–1.2)
Total Protein: 6.6 g/dL (ref 6.1–8.1)

## 2016-09-17 LAB — BASIC METABOLIC PANEL WITHOUT GFR
BUN: 17 mg/dL (ref 7–25)
CO2: 24 mmol/L (ref 20–31)
Calcium: 10.8 mg/dL — ABNORMAL HIGH (ref 8.6–10.4)
Chloride: 99 mmol/L (ref 98–110)
Creat: 1.22 mg/dL — ABNORMAL HIGH (ref 0.60–0.93)
GFR, Est African American: 50 mL/min — ABNORMAL LOW
GFR, Est Non African American: 44 mL/min — ABNORMAL LOW
Glucose, Bld: 84 mg/dL (ref 65–99)
Potassium: 4.5 mmol/L (ref 3.5–5.3)
Sodium: 139 mmol/L (ref 135–146)

## 2016-09-17 LAB — TSH: TSH: 4.36 m[IU]/L

## 2016-09-17 MED ORDER — AZITHROMYCIN 250 MG PO TABS: ORAL_TABLET | ORAL | 1 refills | Status: AC

## 2016-09-17 NOTE — Progress Notes (Signed)
MEDICARE ANNUAL WELLNESS VISIT AND FOLLOW UP  Assessment:    Long term current use of anticoagulant therapy -cont coumadin -adjust prn based on INR - Protime-INR  Migraine without status migrainosus, not intractable, unspecified migraine type -followed by neurology   Essential hypertension -cont meds -well controlled -monitor at home  Atherosclerosis of native coronary artery of native heart without angina pectoris -followed by cards -cont cholesterol medications   Chronic atrial fibrillation (HCC) -cont coumadin -cont meds -followed by cards  Systolic congestive heart failure, unspecified congestive heart failure chronicity ([place]) -followed by cards  Abdominal aortic aneurysm (AAA) without rupture ([place]) -followed by yearly ultrasound -followed by vascular  PVD (peripheral vascular disease) with claudication ([place]) -followed by cards  PAD (peripheral artery disease) ([place]) -followed by cards .palncad   AAA (abdominal aortic aneurysm) without rupture ([place]) -followed by vascular Control blood pressure, cholesterol, glucose, increase exercise.   COPD (chronic obstructive pulmonary disease) with chronic bronchitis (HCC) -quit smoking -cont inhalers  Pulmonary Fibrosis sequellae of Amiodarone -no longer on amiodarone  Gastroesophageal reflux disease, esophagitis presence not specified -cont meds -avoid trigger foods   Hypothyroidism, unspecified hypothyroidism type -cont levothyroxine   Osteopenia -impact exercises as tolerated -cont vit D and calcium  Cervical dysplasia -followed by ortho   CKD  stage III (GFR 51 ml/min) -avoid NSAIDs  Vitamin D deficiency -Vit  D def  Hyperlipidemia -cont statin   Medication management   Morbid obesity, unspecified obesity type (HCC) -cont diet and exercise  Medicare annual wellness visit, initial -due next year, get MGM    Over 30 minutes of exam, counseling, chart review, and critical decision making  was performed  Plan:   During the course of the visit the patient was educated and counseled about appropriate screening and preventive services including:    Pneumococcal vaccine   Influenza vaccine  Td vaccine  Prevnar 13  Screening electrocardiogram  Screening mammography  Bone densitometry screening  Colorectal cancer screening  Diabetes screening  Glaucoma screening  Nutrition counseling   Advanced directives: given info/requested copies  Conditions/risks identified: Diabetes is at goal, ACE/ARB therapy: Yes. Urinary Incontinence is not an issue: discussed non pharmacology and pharmacology options.  Fall risk: low- discussed PT, home fall assessment, medications.    Subjective:   Katie [name] is a 76 y.o. female who presents for Medicare Annual Wellness Visit and 3 month follow up on hypertension, prediabetes, hyperlipidemia, vitamin D def.   Daughter had stroke at 13 2 months ago, helping to take care of her.  Her blood pressure has been controlled at home, today their BP is BP: 118/66  She does not workout. She denies chest pain, shortness of breath, dizziness. She has COPD and has had some sinus issues x Saturday, starting to have some mucus with cough and hoarseness, no fever or chills.  She is on coumadin for afib, has history of PAD, denies claudication, follows with Vascular for AAA.  She is on 1/2 pill 3 days a week and 1 pill 4 days a week which is changed from last visit.  Lab Results  Component Value Date   INR 1.2 (H) 08/20/2016   INR 3.4 (H) 07/26/2016   INR 1.34 07/16/2016   She has history of CHF, weight is stable  BMI is Body mass index is 34.54 kg/m., she is working on diet and exercise. Wt Readings from Last 3 Encounters:  09/17/16 195 lb (88.5 kg)  08/20/16 198 lb 9.6 oz (90.1 kg)  08/16/16 198 lb (  89.8 kg)     She is on cholesterol medication and denies myalgias. Her cholesterol is at goal. The cholesterol last visit was:   Lab  Results  Component Value Date   CHOL 121 07/09/2016   HDL 28 (L) 07/09/2016   LDLCALC 44 07/09/2016   TRIG 246 (H) 07/09/2016   CHOLHDL 4.3 07/09/2016    She has been working on diet and exercise for prediabetes, and denies paresthesia of the feet, polydipsia, polyuria and visual disturbances. Last A1C in the office was:  Lab Results  Component Value Date   HGBA1C 5.7 (H) 06/16/2016   Patient is on Vitamin D supplement.   Lab Results  Component Value Date   VD25OH 84.1 07/10/2016     She is on thyroid medication. Her medication was changed last visit.  1.5 3 days and 1 4 days a week.  Lab Results  Component Value Date   TSH 5.98 (H) 07/26/2016   Medication Review Current Outpatient Prescriptions on File Prior to Visit  Medication Sig Dispense Refill  . acetaminophen (TYLENOL) 500 MG tablet Take 1,000 mg by mouth 2 (two) times daily as needed for moderate pain.    Marland Kitchen albuterol (PROVENTIL HFA;VENTOLIN HFA) 108 (90 Base) MCG/ACT inhaler Inhale 2 puffs into the lungs every 4 (four) hours as needed for shortness of breath. Inhale 2 puffs into the lungs 5 minutes apart every 4 to 6 hours as needed to rescue asthma  0  . allopurinol (ZYLOPRIM) 300 MG tablet TAKE 1 TABLET BY MOUTH EVERY DAY 90 tablet 1  . ALPRAZolam (XANAX) 0.5 MG tablet Take 0.5 mg by mouth every 8 (eight) hours as needed for anxiety.    Marland Kitchen azelastine (ASTELIN) 0.1 % nasal spray Place 2 sprays into both nostrils 2 (two) times daily. Use in each nostril as directed (Patient taking differently: Place 2 sprays into both nostrils as needed. Use in each nostril as directed) 30 mL 2  . budesonide (PULMICORT) 0.25 MG/2ML nebulizer solution Take 2 mLs (0.25 mg total) by nebulization 2 (two) times daily. 60 mL 12  . cetirizine (ZYRTEC) 10 MG tablet TAKE 1 CAPSULE (10 MG TOTAL) BY MOUTH AT BEDTIME. 90 tablet 3  . digoxin (LANOXIN) 0.25 MG tablet TAKE 1 TABLET BY MOUTH EVERY DAY 90 tablet 1  . diltiazem (CARDIZEM CD) 120 MG 24 hr  capsule Take 1 capsule (120 mg total) by mouth daily. 30 capsule 0  . Fluticasone-Umeclidin-Vilant (TRELEGY ELLIPTA) 100-62.5-25 MCG/INH AEPB Inhale 1 puff into the lungs daily. 60 each 3  . furosemide (LASIX) 80 MG tablet TAKE 1 TABLET BY MOUTH TWICE A DAY AS NEEDED FOR FLUID 180 tablet 1  . gabapentin (NEURONTIN) 300 MG capsule Take 1 capsule (300 mg total) by mouth 3 (three) times daily. 270 capsule 0  . hydroxypropyl methylcellulose / hypromellose (ISOPTO TEARS / GONIOVISC) 2.5 % ophthalmic solution Place 1 drop into both eyes 4 (four) times daily as needed for dry eyes.     Marland Kitchen levothyroxine (SYNTHROID, LEVOTHROID) 50 MCG tablet TAKE 1 TABLET EVERY DAY (Patient taking differently: TAKE 25 MG THREE A WEEK THEN TAKE 50 MG 4 TIME A  WEEK) 90 tablet 2  . meclizine (ANTIVERT) 25 MG tablet Take 1 tablet (25 mg total) by mouth 3 (three) times daily as needed for dizziness. 60 tablet 1  . montelukast (SINGULAIR) 10 MG tablet TAKE 1 TABLET EVERY DAY FOR ALLERGIES 90 tablet 1  . pantoprazole (PROTONIX) 40 MG tablet Take 1 tablet (40 mg total)  by mouth daily.    . potassium chloride SA (KLOR-CON M20) 20 MEQ tablet Take 2 tablets (40 mEq total) by mouth daily. 180 tablet 1  . pravastatin (PRAVACHOL) 40 MG tablet TAKE 1 TABLET BY MOUTH AT BEDTIME FOR CHOLESTEROL 90 tablet 1  . prochlorperazine (COMPAZINE) 5 MG tablet TAKE 1 TABLET BY MOUTH 3 TIMES A DAY FOR VERTIGO OR NAUSEA (Patient taking differently: TAKE AS NEEDED) 90 tablet 1  . warfarin (COUMADIN) 2 MG tablet TAKE 1 TO 2 TABLETS BY MOUTH DAILY OR AS DIRECTED 180 tablet 0  . warfarin (COUMADIN) 3 MG tablet Take 3 mg by mouth daily.     No current facility-administered medications on file prior to visit.     Current Problems (verified) Patient Active Problem List   Diagnosis Date Noted  . Chronic systolic CHF (congestive heart failure) ([place]) 07/19/2016  . Acute encephalopathy 07/07/2016  . Chest pain 07/07/2016  . Hyponatremia 07/07/2016  . Acute  kidney injury superimposed on chronic kidney disease ([place]) 07/07/2016  . Acute respiratory failure with hypoxia ([place]) 07/07/2016  . Hypotension 02/03/2016  . Other abnormal glucose 09/19/2015  . AAA (abdominal aortic aneurysm) without rupture ([place]) 04/22/2015  . Morbid obesity due to excess calories ([place]) 08/03/2014  . PVD (peripheral vascular disease) with claudication ([place]) 10/16/2013  . CKD  stage III (GFR 51 ml/min) 06/08/2013  . Hyperlipidemia 06/08/2013  . Medication management 06/08/2013  . 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 Long term current use of anticoagulant therapy 04/23/2013  . Vitamin D deficiency 02/15/2013  . Hypothyroidism   . Osteopenia   . Congestive heart failure ([place]) 11/25/2008  . Migraine headache 11/22/2008  . Essential hypertension 11/22/2008  . Coronary atherosclerosis 11/22/2008  . Atrial fibrillation ([place]) 11/22/2008  . COPD (chronic obstructive pulmonary disease) with chronic bronchitis ([place]) 11/22/2008  . GERD 11/22/2008    Screening Tests Immunization History  Administered Date(s) Administered  . Influenza, High Dose Seasonal PF 12/20/2013, 12/09/2014, 11/14/2015  . Influenza-Unspecified 01/02/2013  . PPD Test 07/17/2016  . Pneumococcal Conjugate-13 01/23/2014  . Pneumococcal-Unspecified 03/15/1993, 05/31/2008  . Td 03/16/2007  . Varicella 02/19/2008    Preventative care: Last colonoscopy: Normal in 2014, declines any further colonoscopy Last mammogram: 08/2014  [id number], due next year PFTs 02/2016 MRI 2018 Stress test 2014 Echo 2018  Prior vaccinations: TD or Tdap: 2009 due next year  Influenza: 2017  Pneumococcal: 2010 Prevnar13: 2015 Shingles/Zostavax: 2009  Names of Other Physician/Practitioners you currently use: 1. [facility]- here for primary care 2. Dr. Patrice Paradise, eye doctor, last visit Feb 2018 3. [facility], dentist, last visit remotely  Patient Care Team: [name], MD as PCP - General (Internal Medicine) Evans Lance, MD as Consulting Physician (Cardiology) Georgeann Oppenheim, MD as Consulting Physician (Ophthalmology) Terrance Mass, MD as Consulting Physician (Gynecology)  Past Surgical History:  Procedure Laterality Date  . ABDOMINAL AORTIC ENDOVASCULAR STENT GRAFT N/A 04/11/2013   Procedure: ABDOMINAL AORTIC ENDOVASCULAR STENT GRAFT WITH RIGHT FEMORAL PATCH ANGIOPLASTY;  Surgeon: Mal Misty, MD;  Location: [place];  Service: Vascular;  Laterality: N/A;  . BREAST SURGERY     LEFT BREAST BIOPSY  . broken leg Left 1970s  . CARDIAC CATHETERIZATION    . CHOLECYSTECTOMY  1972  . COLONOSCOPY    . EYE SURGERY Bilateral   . Laser vein procedure    . REFRACTIVE SURGERY    . TONSILLECTOMY     Family History  Problem Relation Age  of Onset  . Cirrhosis Mother   . Cancer Mother 65       PANCREAS  . Heart defect Sister   . Breast cancer Sister        age 28  . Heart disease Sister   . Stroke Sister   . Alcohol abuse Father   . Depression Father   . Hypertension Brother   . Hyperlipidemia Son   . Heart disease Daughter    Social History  Substance Use Topics  . Smoking status: Former Smoker    Types: Cigarettes    Quit date: 08/10/2002  . Smokeless tobacco: Never Used     Comment: History of tobacco abuse  . Alcohol use 0.0 oz/week     Comment: rare   Allergies  Allergen Reactions  . Amiodarone Other (See Comments)    PULMONARY TOXICITY  . Diovan [Valsartan] Other (See Comments)    HYPOTENSION  . Doxycycline Diarrhea and Other (See Comments)    VISUAL DISTURBANCE  . Flexeril [Cyclobenzaprine] Other (See Comments)    FATIGUE  . Keflex [Cephalexin] Diarrhea  . Verapamil Other (See Comments)    EDEMA  . Advair Diskus [Fluticasone-Salmeterol] Other (See Comments)    SKIN CHANGES.  Can tolerate low dose of prednisone without complications  . Codeine Other (See Comments)    unknown    MEDICARE WELLNESS  OBJECTIVES: Tobacco use: She does not smoke.  Patient is a former smoker. If yes, counseling given Alcohol Current alcohol use: social drinker Diet: well balanced Physical activity:   Cardiac risk factors:   Depression/mood screen:   Depression screen [facility] 2/9 07/26/2016  Decreased Interest 0  Down, Depressed, Hopeless 0  PHQ - 2 Score 0  Some recent data might be hidden    ADLs:  In your present state of health, do you have any difficulty performing the following activities: 07/26/2016 07/07/2016  Hearing? N N  Vision? N N  Difficulty concentrating or making decisions? N N  Walking or climbing stairs? N Y  Dressing or bathing? N N  Doing errands, shopping? N N  Some recent data might be hidden     Cognitive Testing  Alert? Yes  Normal Appearance?Yes  Oriented to person? Yes  Place? Yes   Time? Yes  Recall of three objects?  Yes  Can perform simple calculations? Yes  Displays appropriate judgment?Yes  Can read the correct time from a watch face?Yes  EOL planning: Does Patient Have a Medical Advance Directive?: No Would patient like information on creating a medical advance directive?: Yes (ED - Information included in AVS)   Objective:   Today's Vitals   09/17/16 1050  BP: 118/66  Pulse: 81  Resp: 16  Temp: 98.1 F (36.7 C)  SpO2: 93%  Weight: 195 lb (88.5 kg)  Height: 5\' 3"  (1.6 m)  PainSc: 0-No pain   Body mass index is 34.54 kg/m.  General appearance: alert, no distress, WD/WN,  female HEENT: normocephalic, sclerae anicteric, TMs pearly, nares patent, no discharge or erythema, pharynx normal Oral cavity: MMM, no lesions Neck: supple, no lymphadenopathy, no thyromegaly, no masses Heart: Ireg ireg, normal S1, S2, no murmurs Lungs: CTA bilaterally, no wheezes, rhonchi, or rales Abdomen: +bs, soft, non tender, non distended, no masses, no hepatomegaly, no splenomegaly Musculoskeletal: nontender, no swelling, no obvious deformity Extremities: no edema, no  cyanosis, no clubbing Pulses: 2+ symmetric, upper and lower extremities, normal cap refill Neurological: alert, oriented x 3, CN2-12 intact, strength normal upper extremities and lower extremities,  sensation normal throughout, DTRs 2+ throughout, no cerebellar signs, gait normal Psychiatric: normal affect, behavior normal, pleasant  Breast: defer Gyn: defer Rectal: defer   Medicare Attestation I have personally reviewed: The patient's medical and social history Their use of alcohol, tobacco or illicit drugs Their current medications and supplements The patient's functional ability including ADLs,fall risks, home safety risks, cognitive, and hearing and visual impairment Diet and physical activities Evidence for depression or mood disorders  The patient's weight, height, BMI, and visual acuity have been recorded in the chart.  I have made referrals, counseling, and provided education to the patient based on review of the above and I have provided the patient with a written personalized care plan for preventive services.     Vicie Mutters, PA-C   09/17/2016

## 2016-09-17 NOTE — Patient Instructions (Signed)
[facility]  7 a.m.-6:30 p.m., Monday 7 a.m.-5 p.m., Tuesday-Friday Schedule an appointment by calling [phone].  HOW TO TREAT VIRAL COUGH AND COLD SYMPTOMS:  -Symptoms usually last at least 1 week with the worst symptoms being around day 4.  - colds usually start with a sore throat and end with a cough, and the cough can take 2 weeks to get better.  -No antibiotics are needed for colds, flu, sore throats, cough, bronchitis UNLESS symptoms are longer than 7 days OR if you are getting better then get drastically worse.  -There are a lot of combination medications (Dayquil, Nyquil, Vicks 44, tyelnol cold and sinus, ETC). Please look at the ingredients on the back so that you are treating the correct symptoms and not doubling up on medications/ingredients.    Medicines you can use  Nasal congestion  - pseudoephedrine (Sudafed)- behind the counter, do not use if you have high blood pressure, medicine that have -D in them.  - phenylephrine (Sudafed PE) -Dextormethorphan + chlorpheniramine (Coridcidin HBP)- okay if you have high blood pressure -Oxymetazoline (Afrin) nasal spray- LIMIT to 3 days -Saline nasal spray -Neti pot (used distilled or bottled water)  Ear pain/congestion  -pseudoephedrine (sudafed) - Nasonex/flonase nasal spray  Fever  -Acetaminophen (Tyelnol) -Ibuprofen (Advil, motrin, aleve)  Sore Throat  -Acetaminophen (Tyelnol) -Ibuprofen (Advil, motrin, aleve) -Drink a lot of water -Gargle with salt water - Rest your voice (don't talk) -Throat sprays -Cough drops  Body Aches  -Acetaminophen (Tyelnol) -Ibuprofen (Advil, motrin, aleve)  Headache  -Acetaminophen (Tyelnol) -Ibuprofen (Advil, motrin, aleve) - Exedrin, Exedrin Migraine  Allergy symptoms (cough, sneeze, runny nose, itchy eyes) -Claritin or loratadine cheapest but likely the weakest  -Zyrtec or certizine at night because it can make you sleepy -The strongest is  allegra or fexafinadine  Cheapest at [employer], [employer], [employer]  Cough  -Dextromethorphan (Delsym)- medicine that has DM in it -Guafenesin (Mucinex/Robitussin) - cough drops - drink lots of water  Chest Congestion  -Guafenesin (Mucinex/Robitussin)  Red Itchy Eyes  - Naphcon-A  Upset Stomach  - Bland diet (nothing spicy, greasy, fried, and high acid foods like tomatoes, oranges, berries) -OKAY- cereal, bread, soup, crackers, rice -Eat smaller more frequent meals -reduce caffeine, no alcohol -Loperamide (Imodium-AD) if diarrhea -Prevacid for heart burn  General health when sick  -Hydration -wash your hands frequently -keep surfaces clean -change pillow cases and sheets often -Get fresh air but do not exercise strenuously -Vitamin D, double up on it - Vitamin C -Zinc     Your PT/INR is the test we use to check your coumadin level.  Your goal for your INR is between 2-3.  If you number is below 2 than your blood is thick and you need more coumadin. You are at risk for stroke or clotting during this time period.  If you number is above 3 than your blood is too thin and you need less coumadin or can eat more greens at this time. You are at risk for stomach or head bleeds and need to be careful.

## 2016-09-18 LAB — HEMOGLOBIN A1C
HEMOGLOBIN A1C: 5.5 % (ref <5.7)
MEAN PLASMA GLUCOSE: 111 mg/dL

## 2016-09-18 LAB — PROTIME-INR
INR: 1.3 — AB
Prothrombin Time: 14 s — ABNORMAL HIGH (ref 9.0–11.5)

## 2016-09-18 LAB — MAGNESIUM: MAGNESIUM: 2.3 mg/dL (ref 1.5–2.5)

## 2016-09-21 NOTE — Progress Notes (Signed)
Pt aware of lab results & voiced understanding of those results.

## 2016-09-22 ENCOUNTER — Encounter: Payer: Self-pay | Admitting: Internal Medicine

## 2016-09-22 DIAGNOSIS — Z803 Family history of malignant neoplasm of breast: Secondary | ICD-10-CM | POA: Diagnosis not present

## 2016-09-22 DIAGNOSIS — Z1231 Encounter for screening mammogram for malignant neoplasm of breast: Secondary | ICD-10-CM | POA: Diagnosis not present

## 2016-10-12 ENCOUNTER — Ambulatory Visit (INDEPENDENT_AMBULATORY_CARE_PROVIDER_SITE_OTHER): Payer: Medicare Other | Admitting: Internal Medicine

## 2016-10-12 ENCOUNTER — Encounter: Payer: Self-pay | Admitting: Internal Medicine

## 2016-10-12 ENCOUNTER — Telehealth (INDEPENDENT_AMBULATORY_CARE_PROVIDER_SITE_OTHER): Payer: Self-pay | Admitting: Orthopaedic Surgery

## 2016-10-12 VITALS — BP 126/70 | HR 88 | Temp 96.9°F | Resp 20 | Ht 63.0 in | Wt 194.8 lb

## 2016-10-12 DIAGNOSIS — M1A0790 Idiopathic chronic gout, unspecified ankle and foot, without tophus (tophi): Secondary | ICD-10-CM | POA: Diagnosis not present

## 2016-10-12 DIAGNOSIS — L309 Dermatitis, unspecified: Secondary | ICD-10-CM

## 2016-10-12 MED ORDER — TRIAMCINOLONE ACETONIDE 0.1 % EX CREA: 1.0000 "application " | TOPICAL_CREAM | Freq: Two times a day (BID) | CUTANEOUS | 3 refills | Status: DC

## 2016-10-12 MED ORDER — DEXAMETHASONE 0.5 MG PO TABS: ORAL_TABLET | ORAL | 0 refills | Status: DC

## 2016-10-12 NOTE — Patient Instructions (Addendum)

## 2016-10-12 NOTE — Telephone Encounter (Signed)
Patient called wanting to speak with you about possibly setting up an appointment. CB # [phone]

## 2016-10-12 NOTE — Progress Notes (Signed)
Subjective:    Patient ID: Katie [name], female    DOB: [birth year], 76 y.o.   MRN: [id number]  HPI  This very nice 76 yo MWF with MWF with HTN, HLD, ASHD/combined sys & dias chronic heart failure/cAfib on Coumadin, CKD3 (GFR 39), COPD and GOUT  presents with 1 week hx/o pain in bilat 1st MTP jts. And also c/o numbness in palms & soles of feet.   Medication Sig  . acetaminophen (TYLENOL) 500 MG tablet Take 1,000 mg by mouth 2 (two) times daily as needed for moderate pain.  Marland Kitchen albuterol (PROVENTIL HFA;VENTOLIN HFA) 108 (90 Base) MCG/ACT inhaler Inhale 2 puffs into the lungs every 4 (four) hours as needed for shortness of breath. Inhale 2 puffs into the lungs 5 minutes apart every 4 to 6 hours as needed to rescue asthma  . allopurinol (ZYLOPRIM) 300 MG tablet TAKE 1 TABLET BY MOUTH EVERY DAY  . ALPRAZolam (XANAX) 0.5 MG tablet Take 0.5 mg by mouth every 8 (eight) hours as needed for anxiety.  Marland Kitchen azelastine (ASTELIN) 0.1 % nasal spray Place 2 sprays into both nostrils 2 (two) times daily. Use in each nostril as directed (Patient taking differently: Place 2 sprays into both nostrils as needed. Use in each nostril as directed)  . budesonide (PULMICORT) 0.25 MG/2ML nebulizer solution Take 2 mLs (0.25 mg total) by nebulization 2 (two) times daily.  . cetirizine (ZYRTEC) 10 MG tablet TAKE 1 CAPSULE (10 MG TOTAL) BY MOUTH AT BEDTIME.  . digoxin (LANOXIN) 0.25 MG tablet TAKE 1 TABLET BY MOUTH EVERY DAY  . diltiazem (CARDIZEM CD) 120 MG 24 hr capsule Take 1 capsule (120 mg total) by mouth daily.  . Fluticasone-Umeclidin-Vilant (TRELEGY ELLIPTA) 100-62.5-25 MCG/INH AEPB Inhale 1 puff into the lungs daily.  . furosemide (LASIX) 80 MG tablet TAKE 1 TABLET BY MOUTH TWICE A DAY AS NEEDED FOR FLUID  . gabapentin (NEURONTIN) 300 MG capsule Take 1 capsule (300 mg total) by mouth 3 (three) times daily.  . hydroxypropyl methylcellulose / hypromellose (ISOPTO TEARS / GONIOVISC) 2.5 % ophthalmic solution Place 1 drop  into both eyes 4 (four) times daily as needed for dry eyes.   Marland Kitchen levothyroxine (SYNTHROID, LEVOTHROID) 50 MCG tablet TAKE 1 TABLET EVERY DAY (Patient taking differently: TAKE 25 MG THREE A WEEK THEN TAKE 50 MG 4 TIME A  WEEK)  . meclizine (ANTIVERT) 25 MG tablet Take 1 tablet (25 mg total) by mouth 3 (three) times daily as needed for dizziness.  . montelukast (SINGULAIR) 10 MG tablet TAKE 1 TABLET EVERY DAY FOR ALLERGIES  . pantoprazole (PROTONIX) 40 MG tablet Take 1 tablet (40 mg total) by mouth daily.  . potassium chloride SA (KLOR-CON M20) 20 MEQ tablet Take 2 tablets (40 mEq total) by mouth daily.  . pravastatin (PRAVACHOL) 40 MG tablet TAKE 1 TABLET BY MOUTH AT BEDTIME FOR CHOLESTEROL  . prochlorperazine (COMPAZINE) 5 MG tablet TAKE 1 TABLET BY MOUTH 3 TIMES A DAY FOR VERTIGO OR NAUSEA (Patient taking differently: TAKE AS NEEDED)  . warfarin (COUMADIN) 2 MG tablet TAKE 1 TO 2 TABLETS BY MOUTH DAILY OR AS DIRECTED  . warfarin (COUMADIN) 3 MG tablet Take 3 mg by mouth daily.   Allergies  Allergen Reactions  . Amiodarone Other (See Comments)    PULMONARY TOXICITY  . Diovan [Valsartan] Other (See Comments)    HYPOTENSION  . Doxycycline Diarrhea and Other (See Comments)    VISUAL DISTURBANCE  . Flexeril [Cyclobenzaprine] Other (See Comments)    FATIGUE  .  Keflex [Cephalexin] Diarrhea  . Verapamil Other (See Comments)    EDEMA  . Codeine Other (See Comments)    unknown   Past Medical History:  Diagnosis Date  . Atrial fibrillation ([place])   . Migraine headache   . Osteopenia    Past Surgical History:  Procedure Laterality Date  . ABDOMINAL AORTIC ENDOVASCULAR STENT GRAFT N/A 04/11/2013   Procedure: ABDOMINAL AORTIC ENDOVASCULAR STENT GRAFT WITH RIGHT FEMORAL PATCH ANGIOPLASTY;  Surgeon: Mal Misty, MD;  Location: [place];  Service: Vascular;  Laterality: N/A;  . BREAST SURGERY     LEFT BREAST BIOPSY  . broken leg Left 1970s  . CARDIAC CATHETERIZATION    . CHOLECYSTECTOMY  1972   . COLONOSCOPY    . EYE SURGERY Bilateral   . Laser vein procedure    . REFRACTIVE SURGERY    . TONSILLECTOMY     Review of Systems  10 point systems review negative except as above.    Objective:   Physical Exam  BP 126/70   Pulse 88   Temp (!) 96.9 F (36.1 C)   Resp 20   Ht 5\' 3"  (1.6 m)   Wt 194 lb 12.8 oz (88.4 kg)   BMI 34.51 kg/m   Obese in no acute distress.   HEENT - WNL. Neck - supple.  Chest - Clear equal BS. Cor - Nl HS. RRR w/o sig MGR. PP 1(+). No edema. MS- FROM w/o deformities.  Gait Nl. Bilat tender 1st MTP jts w/o STS or erythema.  Neuro -  Nl w/o focal abnormalities.    Assessment & Plan:   1. Chronic gout of foot  - dexamethasone (DECADRON) 0.5 MG  - Uric acid  2. Eczema, unspecified type  - triamcinolone cream (KENALOG) 0.1 %; Apply 1 application topically 2 (two) times daily. Apply to rash  2 x / day  Dispense: 80 g; Refill: 3

## 2016-10-13 ENCOUNTER — Encounter (INDEPENDENT_AMBULATORY_CARE_PROVIDER_SITE_OTHER): Payer: Self-pay | Admitting: Family

## 2016-10-13 ENCOUNTER — Ambulatory Visit (INDEPENDENT_AMBULATORY_CARE_PROVIDER_SITE_OTHER): Payer: Self-pay

## 2016-10-13 ENCOUNTER — Ambulatory Visit (INDEPENDENT_AMBULATORY_CARE_PROVIDER_SITE_OTHER): Payer: Medicare Other | Admitting: Family

## 2016-10-13 ENCOUNTER — Ambulatory Visit (INDEPENDENT_AMBULATORY_CARE_PROVIDER_SITE_OTHER): Payer: Medicare Other

## 2016-10-13 VITALS — Ht 63.0 in | Wt 194.0 lb

## 2016-10-13 DIAGNOSIS — M1711 Unilateral primary osteoarthritis, right knee: Secondary | ICD-10-CM | POA: Diagnosis not present

## 2016-10-13 DIAGNOSIS — M79672 Pain in left foot: Secondary | ICD-10-CM

## 2016-10-13 DIAGNOSIS — M19172 Post-traumatic osteoarthritis, left ankle and foot: Secondary | ICD-10-CM | POA: Diagnosis not present

## 2016-10-13 DIAGNOSIS — M1A0720 Idiopathic chronic gout, left ankle and foot, without tophus (tophi): Secondary | ICD-10-CM | POA: Diagnosis not present

## 2016-10-13 DIAGNOSIS — M25572 Pain in left ankle and joints of left foot: Secondary | ICD-10-CM

## 2016-10-13 LAB — URIC ACID: URIC ACID, SERUM: 5.4 mg/dL (ref 2.5–7.0)

## 2016-10-13 MED ORDER — METHYLPREDNISOLONE ACETATE 40 MG/ML IJ SUSP
40.0000 mg | INTRAMUSCULAR | Status: AC | PRN
  Administered 2016-10-13: 40 mg via INTRA_ARTICULAR

## 2016-10-13 MED ORDER — LIDOCAINE HCL 1 % IJ SOLN
2.0000 mL | INTRAMUSCULAR | Status: AC | PRN
  Administered 2016-10-13: 2 mL

## 2016-10-13 MED ORDER — COLCHICINE 0.6 MG PO TABS: 0.6000 mg | ORAL_TABLET | Freq: Two times a day (BID) | ORAL | 3 refills | Status: DC

## 2016-10-13 NOTE — Progress Notes (Signed)
Office Visit Note   Patient: Katie [name]           Date of Birth: 17-Dec-1940           MRN: [id number] Visit Date: 10/13/2016              Requested by: [name], [address] [place], [place] [address] PCP: [name], MD  Chief Complaint  Patient presents with  . Left Foot - Pain    Evaluated at PCP office yesterday. Uric acid 5.4 takes allopurinol 300mg  qd and was given rx for dexamethasone taper began yesterday.   . Left Ankle - Pain      HPI: The patient is a 76 year old woman who is seen today for evaluation of bilateral great toe pain at the MTP joint. The left is much more painful than the right. She does have a history of gout. Was evaluated yesterday by her primary care Dr. Melford Aase. Was given a dexamethasone taper. Her uric acid was checked. Uric acid is currently 5.4. Does take allopurinol 300 mg daily. Taking Neurontin 300 mg 3 times a day as well.   History of ankle fracture on the left that was treated conservatively in 1966 has chronic posttraumatic arthritis. Has had great relief states last from 6-9 months with Depo-Medrol injection for the ankle pain. Requesting repeat injection today. Last injection was February of this year.  Assessment & Plan: Visit Diagnoses:  1. Post-traumatic arthritis of ankle, left   2. Pain in left foot   3. Acute left ankle pain   4. Chronic gout of left foot, unspecified cause     Plan: Depo-Medrol injection left ankle. Did prescribe some call Gerald Stabs which she'll take for gout flares. Discussed osteoarthritis of the MTP joint left foot she will follow-up in office as needed with Dr. Ninfa Linden her heel for the right ankle.  History of osteoarthritis right knee has had good relief with Monovisc injections in the past requesting repeat injection today will have asked actually work on the prior authorization and she will follow-up in office for this injection once approved.  Follow-Up Instructions: No  Follow-up on file.   Left Ankle Exam  Swelling: mild  Tenderness  Left ankle tenderness location: Anterior joint line and sinus Tarsi tender.   Other  Erythema: absent Pulse: present  Comments:  Reduced plantar and dorsiflexion, chronic      Patient is alert, oriented, no adenopathy, well-dressed, normal affect, normal respiratory effort. Left foot: plantigrade. Little erythema and swelling to medial MTP, first toe. Mild tenderness. Does have pain with flexion and extension of joint. No hallux rigidus.  Imaging: Xr Foot 2 Views Left  Result Date: 10/13/2016 Radiographs of the left foot show degenerative changes of first MTP joint consistent with osteoarthritis. No bony erosions.   Labs: Lab Results  Component Value Date   HGBA1C 5.5 09/17/2016   HGBA1C 5.7 (H) 06/16/2016   HGBA1C 5.2 03/01/2016   LABURIC 5.4 10/12/2016   LABURIC 5.2 06/16/2016   LABURIC 5.1 03/01/2016   REPTSTATUS 02/05/2016 FINAL 02/03/2016   GRAMSTAIN  12/24/2015    ABUNDANT WBC PRESENT,BOTH PMN AND MONONUCLEAR RARE GRAM POSITIVE COCCI IN PAIRS RARE GRAM NEGATIVE RODS    CULT MULTIPLE SPECIES PRESENT, SUGGEST RECOLLECTION (A) 02/03/2016   LABORGA Insignificant Growth 05/30/2015    Orders:  Orders Placed This Encounter  Procedures  . XR Foot 2 Views Left  . XR Ankle 2 Views Left   Meds ordered this encounter  Medications  . colchicine 0.6 MG tablet    Sig: Take 1 tablet (0.6 mg total) by mouth 2 (two) times daily. Take BID for acute gout attack, stop when acute pain resolves    Dispense:  60 tablet    Refill:  3     Procedures: Medium Joint Inj Date/Time: 10/13/2016 1:07 PM Performed by: Suzan Slick Authorized by: Dondra Prader R   Consent Given by:  Patient Site marked: the procedure site was marked   Timeout: prior to procedure the correct patient, procedure, and site was verified   Indications:  Pain and diagnostic evaluation Location:  Ankle Site:  L ankle Prep: patient was  prepped and draped in usual sterile fashion   Needle Size:  22 G Needle Length:  1.5 inches Ultrasound Guided: No   Fluoroscopic Guidance: No   Medications:  2 mL lidocaine 1 %; 40 mg methylPREDNISolone acetate 40 MG/ML Aspiration Attempted: No   Patient tolerance:  Patient tolerated the procedure well with no immediate complications    Clinical Data: No additional findings.  ROS:  All other systems negative, except as noted in the HPI. Review of Systems  Constitutional: Negative for chills and fever.  Musculoskeletal: Positive for arthralgias. Negative for joint swelling.  Skin: Negative for wound.    Objective: Vital Signs: Ht 5\' 3"  (1.6 m)   Wt 194 lb (88 kg)   BMI 34.37 kg/m   Specialty Comments:  No specialty comments available.  PMFS History: Patient Active Problem List   Diagnosis Date Noted  . Chronic systolic CHF (congestive heart failure) ([place]) 07/19/2016  . Acute encephalopathy 07/07/2016  . Chest pain 07/07/2016  . Hyponatremia 07/07/2016  . Acute kidney injury superimposed on chronic kidney disease ([place]) 07/07/2016  . Acute respiratory failure with hypoxia ([place]) 07/07/2016  . Hypotension 02/03/2016  . Other abnormal glucose 09/19/2015  . AAA (abdominal aortic aneurysm) without rupture ([place]) 04/22/2015  . Morbid obesity due to excess calories ([place]) 08/03/2014  . PVD (peripheral vascular disease) with claudication ([place]) 10/16/2013  . CKD  stage III (GFR 51 ml/min) 06/08/2013  . Hyperlipidemia 06/08/2013  . Medication management 06/08/2013  . 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 Long term current use of anticoagulant therapy 04/23/2013  . Vitamin D deficiency 02/15/2013  . Hypothyroidism   . Osteopenia   . Congestive heart failure ([place]) 11/25/2008  . Migraine headache 11/22/2008  . Essential hypertension 11/22/2008  . Coronary atherosclerosis 11/22/2008  . Atrial fibrillation ([place]) 11/22/2008  . COPD (chronic obstructive  pulmonary disease) with chronic bronchitis ([place]) 11/22/2008  . GERD 11/22/2008   Past Medical History:  Diagnosis Date  . Atrial fibrillation ([place])   . Migraine headache   . Osteopenia     Family History  Problem Relation Age of Onset  . Cirrhosis Mother   . Cancer Mother 71       PANCREAS  . Heart defect Sister   . Breast cancer Sister        age 67  . Heart disease Sister   . Stroke Sister   . Alcohol abuse Father   . Depression Father   . Hypertension Brother   . Hyperlipidemia Son   . Heart disease Daughter     Past Surgical History:  Procedure Laterality Date  . ABDOMINAL AORTIC ENDOVASCULAR STENT GRAFT N/A 04/11/2013   Procedure: ABDOMINAL AORTIC ENDOVASCULAR STENT GRAFT WITH RIGHT FEMORAL PATCH ANGIOPLASTY;  Surgeon: Mal Misty, MD;  Location: [facility];  Service: Vascular;  Laterality: N/A;  . BREAST SURGERY     LEFT BREAST BIOPSY  . broken leg Left 1970s  . CARDIAC CATHETERIZATION    . CHOLECYSTECTOMY  1972  . COLONOSCOPY    . EYE SURGERY Bilateral   . Laser vein procedure    . REFRACTIVE SURGERY    . TONSILLECTOMY     Social History   Occupational History  . Not on file.   Social History Main Topics  . Smoking status: Former Smoker    Types: Cigarettes    Quit date: 08/10/2002  . Smokeless tobacco: Never Used     Comment: History of tobacco abuse  . Alcohol use 0.0 oz/week     Comment: rare  . Drug use: No  . Sexual activity: Yes    Birth control/ protection: Post-menopausal

## 2016-10-13 NOTE — Telephone Encounter (Signed)
Needs Monovisc ordered

## 2016-10-14 ENCOUNTER — Telehealth (INDEPENDENT_AMBULATORY_CARE_PROVIDER_SITE_OTHER): Payer: Self-pay | Admitting: Orthopedic Surgery

## 2016-10-14 NOTE — Telephone Encounter (Signed)
I called and spoke with pharmacy staff, colchicine can cause diarrhea, patient is suppose to be taking this for gout flares only and will discontinue if she is having GI upset.

## 2016-10-14 NOTE — Telephone Encounter (Signed)
[facility] left a message yesterday stating that they have a drug interaction with one of the patient's medications.  CB# [phone].  Thank you

## 2016-10-15 NOTE — Telephone Encounter (Signed)
Sent to [employer] [facility]

## 2016-10-19 ENCOUNTER — Ambulatory Visit (INDEPENDENT_AMBULATORY_CARE_PROVIDER_SITE_OTHER): Payer: Medicare Other | Admitting: Internal Medicine

## 2016-10-19 ENCOUNTER — Encounter: Payer: Self-pay | Admitting: Internal Medicine

## 2016-10-19 VITALS — BP 128/64 | HR 68 | Temp 97.6°F | Resp 20 | Ht 63.0 in | Wt 199.8 lb

## 2016-10-19 DIAGNOSIS — Z7901 Long term (current) use of anticoagulants: Secondary | ICD-10-CM | POA: Diagnosis not present

## 2016-10-19 DIAGNOSIS — I482 Chronic atrial fibrillation, unspecified: Secondary | ICD-10-CM

## 2016-10-19 DIAGNOSIS — Z79899 Other long term (current) drug therapy: Secondary | ICD-10-CM | POA: Diagnosis not present

## 2016-10-19 DIAGNOSIS — I10 Essential (primary) hypertension: Secondary | ICD-10-CM

## 2016-10-19 LAB — CBC WITH DIFFERENTIAL/PLATELET
BASOS PCT: 0 %
Basophils Absolute: 0 cells/uL (ref 0–200)
Eosinophils Absolute: 0 cells/uL — ABNORMAL LOW (ref 15–500)
Eosinophils Relative: 0 %
HCT: 42.7 % (ref 35.0–45.0)
Hemoglobin: 13.9 g/dL (ref 11.7–15.5)
LYMPHS PCT: 17 %
Lymphs Abs: 1938 cells/uL (ref 850–3900)
MCH: 29.5 pg (ref 27.0–33.0)
MCHC: 32.6 g/dL (ref 32.0–36.0)
MCV: 90.7 fL (ref 80.0–100.0)
MONOS PCT: 7 %
MPV: 10 fL (ref 7.5–12.5)
Monocytes Absolute: 798 cells/uL (ref 200–950)
Neutro Abs: 8664 cells/uL — ABNORMAL HIGH (ref 1500–7800)
Neutrophils Relative %: 76 %
PLATELETS: 277 10*3/uL (ref 140–400)
RBC: 4.71 MIL/uL (ref 3.80–5.10)
RDW: 15.4 % — AB (ref 11.0–15.0)
WBC: 11.4 10*3/uL — AB (ref 3.8–10.8)

## 2016-10-19 NOTE — Patient Instructions (Addendum)
Bleeding Precautions When on Anticoagulant Therapy  WHAT IS ANTICOAGULANT THERAPY? Anticoagulant therapy is taking medicine to prevent or reduce blood clots. It is also called blood thinner therapy. Blood clots that form in your blood vessels can be dangerous. They can break loose and travel to your heart, lungs, or brain. This increases your risk of a heart attack or stroke. Anticoagulant therapy causes blood to clot more slowly. You may need anticoagulant therapy if you have:  A medical condition that increases the likelihood that blood clots will form.  A heart defect or a problem with heart rhythm. It is also a common treatment after heart surgery, such as valve replacement. WHAT ARE COMMON TYPES OF ANTICOAGULANT THERAPY? Anticoagulant medicine can be injected or taken by mouth.If you need anticoagulant therapy quickly at the hospital, the medicine may be injected under your skin or given through an IV tube. Heparin is a common example of an anticoagulant that you may get at the hospital. Most anticoagulant therapy is in the form of pills that you take at home every day. These may include:  Aspirin. This common blood thinner works by preventing blood cells (platelets) from sticking together to form a clot. Aspirin is not as strong as anticoagulants that slow down the time that it takes for your body to form a clot.  Clopidogrel. This is a newer type of drug that affects platelets. It is stronger than aspirin.  Warfarin. This is the most common anticoagulant. It changes the way your body uses vitamin K, a vitamin that helps your blood to clot. The risk of bleeding is higher with warfarin than with aspirin. You will need frequent blood tests to make sure you are taking the safest amount.  New anticoagulants. Several new drugs have been approved. They are all taken by mouth. Studies show that these drugs work as well as warfarin. They do not require blood testing. They may cause less bleeding  risk than warfarin. WHAT DO I NEED TO REMEMBER WHEN TAKING ANTICOAGULANT THERAPY? Anticoagulant therapy decreases your risk of forming a blood clot, but it increases your risk of bleeding. Work closely with your health care provider to make sure you are taking your medicine safely. These tips can help:  Learn ways to reduce your risk of bleeding.  If you are taking warfarin: ? Have blood tests as ordered by your health care provider. ? Do not make any sudden changes to your diet. Vitamin K in your diet can make warfarin less effective. ? Do not get pregnant. This medicine may cause birth defects.  Take your medicine at the same time every day. If you forget to take your medicine, take it as soon as you remember. If you miss a whole day, do not double your dose of medicine. Take your normal dose and call your health care provider to check in.  Do not stop taking your medicine on your own.  Tell your health care provider before you start taking any new medicine, vitamin, or herbal product. Some of these could interfere with your therapy.  Tell all of your health care providers that you are on anticoagulant therapy.  Do not have surgery, medical procedures, or dental work until you tell your health care provider that you are on anticoagulant therapy. WHAT CAN AFFECT HOW ANTICOAGULANTS WORK? Certain foods, vitamins, medicines, supplements, and herbal medicines change the way that anticoagulant therapy works. They may increase or decrease the effects of your anticoagulant therapy. Either result can be dangerous for you.    Many over-the-counter medicines for pain, colds, or stomach problems interfere with anticoagulant therapy. Take these only as told by your health care provider.  Do not drink alcohol. It can interfere with your medicine and increase your risk of an injury that causes bleeding.  If you are taking warfarin, do not begin eating more foods that contain vitamin K. These include  leafy green vegetables. Ask your health care provider if you should avoid any foods. WHAT ARE SOME WAYS TO PREVENT BLEEDING? You can prevent bleeding by taking certain precautions:  Be extra careful when you use knives, scissors, or other sharp objects.  Use an electric razor instead of a blade.  Do not use toothpicks.  Use a soft toothbrush.  Wear shoes that have nonskid soles.  Use bath mats and handrails in your bathroom.  Wear gloves while you do yard work.  Wear a helmet when you ride a bike.  Wear your seat belt.  Prevent falls by removing loose rugs and extension cords from areas where you walk.  Do not play contact sports or participate in other activities that have a high risk of injury. [place] PROVIDER? Call your health care provider if:  You miss a dose of medicine: ? And you are not sure what to do. ? For more than one day.  You have: ? Menstrual bleeding that is heavier than normal. ? Blood in your urine. ? A bloody nose or bleeding gums. ? Easy bruising. ? Blood in your stool (feces) or have black and tarry stool. ? Side effects from your medicine.  You feel weak or dizzy.  You become pregnant. Seek immediate medical care if:  You have bleeding that will not stop.  You have sudden and severe headache or belly pain.  You vomit or you cough up bright red blood.  You have a severe blow to your head. WHAT ARE SOME QUESTIONS TO ASK MY HEALTH CARE PROVIDER?  What is the best anticoagulant therapy for my condition?  What side effects should I watch for?  When should I take my medicine? What should I do if I forget to take it?  Will I need to have regular blood tests?  Do I need to change my diet? Are there foods or drinks that I should avoid?  What activities are safe for me?

## 2016-10-19 NOTE — Progress Notes (Signed)
[facility]   [name], M.D.      Uvaldo Bristle. Silverio Lay, P.A.-C  [facility]                [place] [address] Telephone [phone] Telefax [phone] _________________________________    This very nice 76 yo MWF with MWF with HTN, HLD, ASHD/combined sys & dias chronic heart failure, CKD3 (GFR 39), COPD and Gout is on Coumadin for chronic Afib. Patient's last INR was low and coumadin was increased: Lab Results  Component Value Date   INR 1.3 (H) 09/17/2016   INR 1.2 (H) 08/20/2016   INR 3.4 (H) 07/26/2016        Patient denies SOB, CP, dizziness, nose bleeds, easy bleeding, and blood in stool/urine.   Medication Sig  . acetaminophen  500 MG tablet Take 1,000 mg by mouth 2 (two) times daily as needed for moderate pain.  Marland Kitchen albuterol HFA  inhaler Inhale 2 puffs into the lungs every 4 (four) hours as needed   . allopurinol 300 MG tablet TAKE 1 TABLET BY MOUTH EVERY DAY  . ALPRAZolam (XANAX) 0.5 MG tablet Take  every 8  hours as needed for anxiety.  . ASTELIN nasal spray 2 sprays into nostrils 2 (two) times daily.   . budesonide (PULMICORT) 0.25 MG/2ML nebulizer solution Take 2 mLs (0.25 mg total) by nebulization 2 (two) times daily.  . cetirizine  10 MG tablet TAKE 1 CAPSULE (10 MG TOTAL) BY MOUTH AT BEDTIME.  . colchicine 0.6 MG tablet Take 1 tablet (0.6 mg total) by mouth 2 (two) times daily.   . digoxin  0.25 MG tablet TAKE 1 TABLET BY MOUTH EVERY DAY  . diltiazem CD 120 MG 24 hr cap Take 1 capsule (120 mg total) by mouth daily.  . TRELEGY ELLIPTA 100-62.5-25  Inhale 1 puff into the lungs daily.  . furosemide  80 MG tablet TAKE 1 TABLET BY MOUTH TWICE A DAY AS NEEDED FOR FLUID  . gabapentin  300 MG capsule Take 1 capsule (300 mg total) by mouth 3 (three) times daily.  . ISOPTO TEARS  2.5 % ophth soln Place 1 drop into both eyes 4 (four) times daily as needed for dry eyes.    Marland Kitchen levothyroxine  50 MCG tablet 1.5 x 3 days MWF  And 1 x 4 days TTSS  . meclizine 25 MG tablet Take 1 tab 3 x daily as needed for dizziness.  . montelukast 10 MG tablet TAKE 1 TABLET EVERY DAY FOR ALLERGIES  . pantoprazole  40 MG tablet Take 1 tab daily.  Marland Kitchen KLOR-CON 20 MEQ tablet Take 2 tab  daily.  . pravastatin  40 MG tablet TAKE 1 TAB AT BEDTIME FOR CHOLESTEROL  . prochlorperazine  5 MG tablet TAKE AS NEEDED)  . KENALOG 0.1 % Apply 1 application topically 2 (two) times daily. Apply to rash  2 x / day  . warfarin (COUMADIN) 2 MG tablet 1/2 tab x 2 days & 1 tab x 5 days = 6 tabs/week   Allergies  Allergen Reactions  . Amiodarone PULMONARY TOXICITY  . Diovan [Valsartan] HYPOTENSION  . Doxycycline Diarrhea & VISUAL DISTURBANCE  . Flexeril [Cyclobenzaprine] FATIGUE  . Keflex [Cephalexin] Diarrhea  . Verapamil EDEMA  . Codeine unknown   FHx/SHx - reviewed/unchanged  Systems Review Constitutional: Denies fever, chills, headaches, fatigue. Cardio: Denies  chest pain, palpitations, irregular heartbeat, syncope, dyspnea, diaphoresis, orthopnea, PND, claudication, or edema. Respiratory: no cough, congestion, sputum, dyspnea, pleurisy, hoarseness, laryngitis, wheezing.  Gastrointestinal: Denies dysphagia, heartburn, reflux, pain, cramps, nausea, diarrhea, constipation, hematemesis, melena, hematochezia. Genitourinary: Denies dysuria, frequency, hematuria, flank pain. Musculoskeletal: Recently had bilat foot pain & saw Orthopedist for aspiration. Skin: Denies rash, excessive bleeding or bruising. Neuro: Weakness, tremor, incoordination, spasms, paresthesia, pain Heme/Lymph: Excessive bleeding, bruising, enlarged lymph nodes  Examination: General Appearance: A & oriented. Well nourished, in distress. HEENT: Normal & unremarkable Neck: Supple - Carotids 2+/2+ & thyroid normal. No bruits, nodes or JVD. Respiratory: BS equal & clear without rales, rhonci, or wheezing. Cardio: Heart sounds  soft, slightly irregular rate and rhythm without murmurs, rubs or gallops. Peripheral pulses normal without edema.  Abdomen: Soft, benign w/o masses, tenderness, guarding, rebound, hernias, or organomegaly.  Skin: Warm, dry without rashes, lesions, ecchymosis.  MS: Gait & station nl. Neuro: Grossly WNL  Assessment & plan:  1. Essential hypertension  - CBC with Differential/Platelet  2. Chronic atrial fibrillation (HCC)  - Protime-INR  3. Long term current use of anticoagulant therapy  - Protime-INR  4. Medication management  - CBC with Differential/Platelet  Chronic anticoagulation- check PT/INR Discussed med & diet effect/SE. Patient understands to call the office before starting a new medication. F/U 1 mo

## 2016-10-20 ENCOUNTER — Telehealth (INDEPENDENT_AMBULATORY_CARE_PROVIDER_SITE_OTHER): Payer: Self-pay | Admitting: Orthopaedic Surgery

## 2016-10-20 LAB — PROTIME-INR
INR: 1.9 — AB
PROTHROMBIN TIME: 19.7 s — AB (ref 9.0–11.5)

## 2016-10-20 NOTE — Telephone Encounter (Signed)
Larkin Ina with [facility] called advised patient just want to proceed with getting her Rt Knee done only. Larkin Ina advised the Rx need to be changed. The number to contact patient is [phone]

## 2016-10-21 ENCOUNTER — Telehealth (INDEPENDENT_AMBULATORY_CARE_PROVIDER_SITE_OTHER): Payer: Self-pay | Admitting: Radiology

## 2016-10-21 ENCOUNTER — Telehealth (INDEPENDENT_AMBULATORY_CARE_PROVIDER_SITE_OTHER): Payer: Self-pay | Admitting: Orthopaedic Surgery

## 2016-10-21 NOTE — Telephone Encounter (Signed)
Christine from [facility] called needing the ICD-10 code for the patient's injections that was changed to the right knee only.  CB#[phone].  Thank you.

## 2016-10-21 NOTE — Telephone Encounter (Signed)
Faxed Rx to [phone]

## 2016-10-21 NOTE — Telephone Encounter (Signed)
Received a Advertising account executive from [employer] [employer] yesterday. Advised ok for just right knee. They are just waiting on patient to call them back to collect a copay so prescription can be sent to our office.

## 2016-10-22 NOTE — Telephone Encounter (Signed)
M17.11 given to [facility]

## 2016-10-25 ENCOUNTER — Telehealth: Payer: Self-pay | Admitting: Internal Medicine

## 2016-10-25 NOTE — Telephone Encounter (Signed)
Per Dr Melford Aase,  patient requests, faxed rx for nebulizer and tubing, to [facility], her machine broke. Faxed order, insurance card, and demographics. Patient aware.

## 2016-10-26 ENCOUNTER — Telehealth (INDEPENDENT_AMBULATORY_CARE_PROVIDER_SITE_OTHER): Payer: Self-pay

## 2016-10-26 NOTE — Telephone Encounter (Addendum)
Justin with [facility] called stating that patient did not want medication because it causes diarrhea and that she was not able to pick up Rx.  Would like to know if you could give patient a call to discuss the Rx for Monovisc.  Rx is currently on hold until patient calls [facility].  CB# is [phone].  Please advise.

## 2016-11-03 ENCOUNTER — Other Ambulatory Visit: Payer: Self-pay | Admitting: *Deleted

## 2016-11-03 MED ORDER — GABAPENTIN 300 MG PO CAPS: 300.0000 mg | ORAL_CAPSULE | Freq: Three times a day (TID) | ORAL | 1 refills | Status: DC

## 2016-11-04 ENCOUNTER — Other Ambulatory Visit: Payer: Self-pay | Admitting: *Deleted

## 2016-11-04 MED ORDER — GABAPENTIN 600 MG PO TABS: ORAL_TABLET | ORAL | 1 refills | Status: DC

## 2016-11-08 DIAGNOSIS — H01021 Squamous blepharitis right upper eyelid: Secondary | ICD-10-CM | POA: Diagnosis not present

## 2016-11-08 DIAGNOSIS — H10503 Unspecified blepharoconjunctivitis, bilateral: Secondary | ICD-10-CM | POA: Diagnosis not present

## 2016-11-08 DIAGNOSIS — H16223 Keratoconjunctivitis sicca, not specified as Sjogren's, bilateral: Secondary | ICD-10-CM | POA: Diagnosis not present

## 2016-11-08 DIAGNOSIS — H01022 Squamous blepharitis right lower eyelid: Secondary | ICD-10-CM | POA: Diagnosis not present

## 2016-11-08 DIAGNOSIS — Z961 Presence of intraocular lens: Secondary | ICD-10-CM | POA: Diagnosis not present

## 2016-11-16 DIAGNOSIS — H1132 Conjunctival hemorrhage, left eye: Secondary | ICD-10-CM | POA: Diagnosis not present

## 2016-11-17 NOTE — Progress Notes (Signed)
Subjective:    Patient ID: Katie [name], female    DOB: 1940-11-26, 76 y.o.   MRN: [id number]  HPI 76 y.o. WF with history of afib presents for INR check.  She had flare of chronic ocular rosacea for which she was placed on erythromycin 1.5 weeks ago. She has not recently changed her dose of warfarin. She denies signs of bleeding including unusual bruising, bleeding from gums, melania or hematochezia.   She is concerned about her lower extremity edema- she is currently taking 80 mg of lasix in the AM and 40 in the afternoon. She is not currently wearing support hose. She also has history of CHF, weight is stable.  BMI is Body mass index is 34.37 kg/m., she is working on diet and exercise. Wt Readings from Last 3 Encounters:  11/18/16 194 lb (88 kg)  10/19/16 199 lb 12.8 oz (90.6 kg)  10/13/16 194 lb (88 kg)    She has a history of COPD.  She is on coumadin for cAfib, will recheck inr, is currently taking 1.5 tabs MWF, 1 tab TThSS. She also has a history of PAD denies claudication, and has history of AAA, follows with vascular.  Lab Results  Component Value Date   INR 1.9 (H) 10/19/2016   INR 1.3 (H) 09/17/2016   INR 1.2 (H) 08/20/2016    Blood pressure 120/70, pulse 85, temperature 97.7 F (36.5 C), resp. rate 14, height 5\' 3"  (1.6 m), weight 194 lb (88 kg), SpO2 95 %.  Current Outpatient Prescriptions on File Prior to Visit  Medication Sig Dispense Refill  . acetaminophen (TYLENOL) 500 MG tablet Take 1,000 mg by mouth 2 (two) times daily as needed for moderate pain.    Marland Kitchen albuterol (PROVENTIL HFA;VENTOLIN HFA) 108 (90 Base) MCG/ACT inhaler Inhale 2 puffs into the lungs every 4 (four) hours as needed for shortness of breath. Inhale 2 puffs into the lungs 5 minutes apart every 4 to 6 hours as needed to rescue asthma  0  . allopurinol (ZYLOPRIM) 300 MG tablet TAKE 1 TABLET BY MOUTH EVERY DAY 90 tablet 1  . ALPRAZolam (XANAX) 0.5 MG tablet Take 0.5 mg by mouth every 8 (eight) hours  as needed for anxiety.    Marland Kitchen azelastine (ASTELIN) 0.1 % nasal spray Place 2 sprays into both nostrils 2 (two) times daily. Use in each nostril as directed (Patient taking differently: Place 2 sprays into both nostrils as needed. Use in each nostril as directed) 30 mL 2  . budesonide (PULMICORT) 0.25 MG/2ML nebulizer solution Take 2 mLs (0.25 mg total) by nebulization 2 (two) times daily. 60 mL 12  . cetirizine (ZYRTEC) 10 MG tablet TAKE 1 CAPSULE (10 MG TOTAL) BY MOUTH AT BEDTIME. 90 tablet 3  . colchicine 0.6 MG tablet Take 1 tablet (0.6 mg total) by mouth 2 (two) times daily. Take BID for acute gout attack, stop when acute pain resolves 60 tablet 3  . dexamethasone (DECADRON) 0.5 MG tablet Take 1 tab 3 x day - 3 days, then 2 x day - 3 days, then 1 tab daily 20 tablet 0  . digoxin (LANOXIN) 0.25 MG tablet TAKE 1 TABLET BY MOUTH EVERY DAY 90 tablet 1  . diltiazem (CARDIZEM CD) 120 MG 24 hr capsule Take 1 capsule (120 mg total) by mouth daily. 30 capsule 0  . Fluticasone-Umeclidin-Vilant (TRELEGY ELLIPTA) 100-62.5-25 MCG/INH AEPB Inhale 1 puff into the lungs daily. 60 each 3  . furosemide (LASIX) 80 MG tablet TAKE 1 TABLET  BY MOUTH TWICE A DAY AS NEEDED FOR FLUID 180 tablet 1  . gabapentin (NEURONTIN) 600 MG tablet Takes 1/2 to 1 tablet 3 times daily. 270 tablet 1  . hydroxypropyl methylcellulose / hypromellose (ISOPTO TEARS / GONIOVISC) 2.5 % ophthalmic solution Place 1 drop into both eyes 4 (four) times daily as needed for dry eyes.     Marland Kitchen levothyroxine (SYNTHROID, LEVOTHROID) 50 MCG tablet TAKE 1 TABLET EVERY DAY (Patient taking differently: TAKE 25 MG THREE A WEEK THEN TAKE 50 MG 4 TIME A  WEEK) 90 tablet 2  . meclizine (ANTIVERT) 25 MG tablet Take 1 tablet (25 mg total) by mouth 3 (three) times daily as needed for dizziness. 60 tablet 1  . montelukast (SINGULAIR) 10 MG tablet TAKE 1 TABLET EVERY DAY FOR ALLERGIES 90 tablet 1  . pantoprazole (PROTONIX) 40 MG tablet Take 1 tablet (40 mg total) by  mouth daily.    . potassium chloride SA (KLOR-CON M20) 20 MEQ tablet Take 2 tablets (40 mEq total) by mouth daily. 180 tablet 1  . pravastatin (PRAVACHOL) 40 MG tablet TAKE 1 TABLET BY MOUTH AT BEDTIME FOR CHOLESTEROL 90 tablet 1  . prochlorperazine (COMPAZINE) 5 MG tablet TAKE 1 TABLET BY MOUTH 3 TIMES A DAY FOR VERTIGO OR NAUSEA (Patient taking differently: TAKE AS NEEDED) 90 tablet 1  . triamcinolone cream (KENALOG) 0.1 % Apply 1 application topically 2 (two) times daily. Apply to rash  2 x / day 80 g 3  . warfarin (COUMADIN) 2 MG tablet TAKE 1 TO 2 TABLETS BY MOUTH DAILY OR AS DIRECTED 180 tablet 0  . warfarin (COUMADIN) 3 MG tablet Take 3 mg by mouth daily.     No current facility-administered medications on file prior to visit.    Past Medical History:  Diagnosis Date  . Atrial fibrillation ([place])   . Migraine headache   . Osteopenia     Review of Systems  Constitutional: Positive for fatigue. Negative for activity change, appetite change, chills, diaphoresis, fever and unexpected weight change.  HENT: Positive for ear pain and hearing loss. Negative for congestion and sore throat.   Respiratory: Positive for cough. Negative for chest tightness, shortness of breath and wheezing.   Cardiovascular: Positive for leg swelling. Negative for chest pain and palpitations.  Gastrointestinal: Negative.   Genitourinary: Negative.   Musculoskeletal: Negative for back pain.  Skin: Negative.   Neurological: Negative for dizziness, facial asymmetry, light-headedness, numbness and headaches.  Psychiatric/Behavioral: Negative.        Objective:   Physical Exam  Constitutional: She is oriented to person, place, and time. She appears well-developed and well-nourished.  HENT:  Head: Normocephalic and atraumatic.  Right Ear: Decreased hearing is noted.  Left Ear: Decreased hearing is noted.  Nose: Right sinus exhibits no maxillary sinus tenderness and no frontal sinus tenderness. Left sinus  exhibits no maxillary sinus tenderness and no frontal sinus tenderness.  Effusion bilateral ears  Eyes: Conjunctivae and EOM are normal.  Neck: Normal range of motion. Neck supple.  Cardiovascular: Normal rate.  An irregularly irregular rhythm present. Exam reveals decreased pulses.   Murmur heard.  Systolic murmur is present  Pulmonary/Chest: Effort normal. No accessory muscle usage. No respiratory distress. She has decreased breath sounds. She has no wheezes. She has no rhonchi. She has no rales.  Abdominal: Soft. Bowel sounds are normal.  Musculoskeletal: She exhibits edema (1-2+ bilateral).       Lumbar back: She exhibits normal range of motion, no tenderness, no  bony tenderness, no swelling, no edema, no deformity, no laceration, no pain, no spasm and normal pulse.  Lymphadenopathy:    She has no cervical adenopathy.  Neurological: She is alert and oriented to person, place, and time. She has normal strength. No cranial nerve deficit or sensory deficit.  Skin: Skin is warm and dry.       Assessment & Plan:   PAD (peripheral artery disease) (HCC) -     BASIC METABOLIC PANEL WITH GFR - follow up with vascular doctor - Control blood pressure, cholesterol, glucose, increase exercise.   Systolic congestive heart failure, unspecified congestive heart failure chronicity (HCC) -     BASIC METABOLIC PANEL WITH GFR -     losartan (COZAAR) 100 MG tablet; Take 1 tablet (100 mg total) by mouth daily. Continue cardio follow up  Chronic atrial fibrillation (HCC) -     Protime-INR - denies any bleeding/bruising etc.   AAA (abdominal aortic aneurysm) without rupture (HCC) -     Hepatic function panel -Control blood pressure, cholesterol, glucose, increase exercise.   COPD (chronic obstructive pulmonary disease) with chronic bronchitis (HCC) -     losartan (COZAAR) 100 MG tablet; Take 1 tablet (100 mg total) by mouth daily. 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

## 2016-11-18 ENCOUNTER — Ambulatory Visit (INDEPENDENT_AMBULATORY_CARE_PROVIDER_SITE_OTHER): Payer: Medicare Other | Admitting: Physician Assistant

## 2016-11-18 VITALS — BP 120/70 | HR 85 | Temp 97.7°F | Resp 14 | Ht 63.0 in | Wt 194.0 lb

## 2016-11-18 DIAGNOSIS — I714 Abdominal aortic aneurysm, without rupture, unspecified: Secondary | ICD-10-CM

## 2016-11-18 DIAGNOSIS — T45515A Adverse effect of anticoagulants, initial encounter: Secondary | ICD-10-CM

## 2016-11-18 DIAGNOSIS — I739 Peripheral vascular disease, unspecified: Secondary | ICD-10-CM

## 2016-11-18 DIAGNOSIS — J4489 Other specified chronic obstructive pulmonary disease: Secondary | ICD-10-CM

## 2016-11-18 DIAGNOSIS — I5042 Chronic combined systolic (congestive) and diastolic (congestive) heart failure: Secondary | ICD-10-CM | POA: Diagnosis not present

## 2016-11-18 DIAGNOSIS — I482 Chronic atrial fibrillation, unspecified: Secondary | ICD-10-CM

## 2016-11-18 DIAGNOSIS — J449 Chronic obstructive pulmonary disease, unspecified: Secondary | ICD-10-CM | POA: Diagnosis not present

## 2016-11-18 DIAGNOSIS — D6832 Hemorrhagic disorder due to extrinsic circulating anticoagulants: Secondary | ICD-10-CM

## 2016-11-18 NOTE — Patient Instructions (Addendum)
Your ears and sinuses are connected by the eustachian tube. When your sinuses are inflamed, this can close off the tube and cause fluid to collect in your middle ear. This can then cause dizziness, popping, clicking, ringing, and echoing in your ears. This is often NOT an infection and does NOT require antibiotics, it is caused by inflammation so the treatments help the inflammation. This can take a long time to get better so please be patient.  Here are things you can do to help with this: - Try the Flonase or Nasonex. Remember to spray each nostril twice towards the outer part of your eye.  Do not sniff but instead pinch your nose and tilt your head back to help the medicine get into your sinuses.  The best time to do this is at bedtime.Stop if you get blurred vision or nose bleeds.  -While drinking fluids, pinch and hold nose close and swallow, to help open eustachian tubes to drain fluid behind ear drums. -Please pick one of the over the counter allergy medications below and take it once daily for allergies.  It will also help with fluid behind ear drums. Claritin or loratadine cheapest but likely the weakest  Zyrtec or certizine at night because it can make you sleepy The strongest is allegra or fexafinadine  Cheapest at [employer], [employer], [employer] -can use decongestant over the counter, please do not use if you have high blood pressure or certain heart conditions.   if worsening HA, changes vision/speech, imbalance, weakness go to the [place]   Do not stand or sit in one position for long periods of time. Do not sit with your legs crossed. Rest with your legs raised during the day.  Your legs have to be higher than your heart so that gravity will force the valves to open, so please really elevate your legs.   Wear elastic stockings or support hose. Do not wear other tight, encircling garments around the legs, pelvis, or waist.  [employer]  has a wide variety of well  priced compression stockings. [place], [place] [id number] [phone]  Walk as much as possible to increase blood flow.  Raise the foot of your bed at night with 2-inch blocks. SEEK MEDICAL CARE IF:   The skin around your ankle starts to break down.  You have pain, redness, tenderness, or hard swelling developing in your leg over a vein.  You are uncomfortable due to leg pain. Document Released: 12/09/2004 Document Revised: 05/24/2011 Document Reviewed: 04/27/2010 [facility] Patient Information 2014 [place].

## 2016-11-19 LAB — BASIC METABOLIC PANEL WITH GFR
BUN/Creatinine Ratio: 14 (calc) (ref 6–22)
BUN: 17 mg/dL (ref 7–25)
CALCIUM: 9.9 mg/dL (ref 8.6–10.4)
CO2: 27 mmol/L (ref 20–32)
CREATININE: 1.24 mg/dL — AB (ref 0.60–0.93)
Chloride: 101 mmol/L (ref 98–110)
GFR, EST NON AFRICAN AMERICAN: 43 mL/min/{1.73_m2} — AB (ref >=60)
GFR, Est African American: 50 mL/min/{1.73_m2} — ABNORMAL LOW (ref >=60)
Glucose, Bld: 85 mg/dL (ref 65–99)
Potassium: 4.4 mmol/L (ref 3.5–5.3)
Sodium: 139 mmol/L (ref 135–146)

## 2016-11-19 LAB — PROTIME-INR
INR: 3.1 — ABNORMAL HIGH
Prothrombin Time: 32.9 s — ABNORMAL HIGH (ref 9.0–11.5)

## 2016-12-06 ENCOUNTER — Other Ambulatory Visit: Payer: Self-pay | Admitting: Internal Medicine

## 2016-12-08 ENCOUNTER — Other Ambulatory Visit: Payer: Self-pay | Admitting: Physician Assistant

## 2016-12-09 ENCOUNTER — Other Ambulatory Visit: Payer: Self-pay | Admitting: Internal Medicine

## 2016-12-09 DIAGNOSIS — J45909 Unspecified asthma, uncomplicated: Secondary | ICD-10-CM

## 2016-12-09 MED ORDER — ALBUTEROL SULFATE HFA 108 (90 BASE) MCG/ACT IN AERS: 2.0000 | INHALATION_SPRAY | RESPIRATORY_TRACT | 3 refills | Status: DC | PRN

## 2016-12-21 ENCOUNTER — Telehealth (INDEPENDENT_AMBULATORY_CARE_PROVIDER_SITE_OTHER): Payer: Self-pay | Admitting: Orthopaedic Surgery

## 2016-12-21 ENCOUNTER — Telehealth (INDEPENDENT_AMBULATORY_CARE_PROVIDER_SITE_OTHER): Payer: Self-pay

## 2016-12-21 NOTE — Telephone Encounter (Signed)
Patient called stating that she received a letter in regards to her monvisc injection that needs to be returned back in 10 days.  She is needing to talk to you.  CB#[phone].  Thank you.

## 2016-12-21 NOTE — Telephone Encounter (Signed)
Pt called and said she needs to speak with you regarding her monovisc

## 2016-12-22 ENCOUNTER — Ambulatory Visit (INDEPENDENT_AMBULATORY_CARE_PROVIDER_SITE_OTHER): Payer: Medicare Other | Admitting: Physician Assistant

## 2016-12-22 DIAGNOSIS — M19072 Primary osteoarthritis, left ankle and foot: Secondary | ICD-10-CM | POA: Diagnosis not present

## 2016-12-22 MED ORDER — METHYLPREDNISOLONE ACETATE 40 MG/ML IJ SUSP
40.0000 mg | INTRAMUSCULAR | Status: AC | PRN
  Administered 2016-12-22: 40 mg via INTRA_ARTICULAR

## 2016-12-22 MED ORDER — LIDOCAINE HCL 1 % IJ SOLN
2.0000 mL | INTRAMUSCULAR | Status: AC | PRN
  Administered 2016-12-22: 2 mL

## 2016-12-22 NOTE — Progress Notes (Signed)
   Procedure Note  Patient: Katie [name]             Date of Birth: 08/13/1940           MRN: [id number]             Visit Date: 12/22/2016 History of present illness: Katie [name] any 76-year-old female well-known Dr. [facility] [place] service has known posttraumatic arthritis left ankle comes in today requesting injection in the ankle. She states the ankle pain just began over the last couple weeks. Last injection was given in February when she did well until recently. No new injury. Radiographs dated 10/13/2016 2 views of the left ankle are reviewed and show posttraumatic changes from a distal tib-fib fracture with the ankle well located within the ankle mortise. Severe arthritic changes of the ankle joint. Procedures: Visit Diagnoses: Arthritis of ankle, left  Medium Joint Inj Date/Time: 12/22/2016 2:30 PM Performed by: Pete Pelt Authorized by: Pete Pelt   Consent Given by:  Parent Site marked: the procedure site was marked   Location:  Ankle Site:  L ankle Needle Size:  25 G Needle Length:  1.5 inches Approach:  Anterolateral Ultrasound Guided: No   Fluoroscopic Guidance: No   Medications:  2 mL lidocaine 1 %; 40 mg methylPREDNISolone acetate 40 MG/ML   Plan: She'll follow up on an as-needed basis. She understands she can have the injections in her ankle no more often than every 3 months.

## 2016-12-22 NOTE — Telephone Encounter (Signed)
Can you do me a huge favor and call her back and get as much info as possible, also tel lher I am aware, just so busy in clinic haven't had time yet to work on this, but see what she was wanting to talk about? Thank you so much

## 2016-12-28 ENCOUNTER — Other Ambulatory Visit: Payer: Self-pay | Admitting: Internal Medicine

## 2017-01-06 ENCOUNTER — Encounter: Payer: Self-pay | Admitting: Internal Medicine

## 2017-01-06 ENCOUNTER — Ambulatory Visit (INDEPENDENT_AMBULATORY_CARE_PROVIDER_SITE_OTHER): Payer: Medicare Other | Admitting: Internal Medicine

## 2017-01-06 VITALS — BP 136/84 | HR 73 | Temp 97.3°F | Resp 14 | Ht 63.5 in | Wt 196.2 lb

## 2017-01-06 DIAGNOSIS — J8410 Pulmonary fibrosis, unspecified: Secondary | ICD-10-CM

## 2017-01-06 DIAGNOSIS — E039 Hypothyroidism, unspecified: Secondary | ICD-10-CM | POA: Diagnosis not present

## 2017-01-06 DIAGNOSIS — I714 Abdominal aortic aneurysm, without rupture, unspecified: Secondary | ICD-10-CM

## 2017-01-06 DIAGNOSIS — I2510 Atherosclerotic heart disease of native coronary artery without angina pectoris: Secondary | ICD-10-CM

## 2017-01-06 DIAGNOSIS — I482 Chronic atrial fibrillation, unspecified: Secondary | ICD-10-CM

## 2017-01-06 DIAGNOSIS — Z1212 Encounter for screening for malignant neoplasm of rectum: Secondary | ICD-10-CM

## 2017-01-06 DIAGNOSIS — Z0000 Encounter for general adult medical examination without abnormal findings: Secondary | ICD-10-CM | POA: Diagnosis not present

## 2017-01-06 DIAGNOSIS — M1A0790 Idiopathic chronic gout, unspecified ankle and foot, without tophus (tophi): Secondary | ICD-10-CM

## 2017-01-06 DIAGNOSIS — N390 Urinary tract infection, site not specified: Secondary | ICD-10-CM

## 2017-01-06 DIAGNOSIS — I739 Peripheral vascular disease, unspecified: Secondary | ICD-10-CM

## 2017-01-06 DIAGNOSIS — Z1211 Encounter for screening for malignant neoplasm of colon: Secondary | ICD-10-CM

## 2017-01-06 DIAGNOSIS — J449 Chronic obstructive pulmonary disease, unspecified: Secondary | ICD-10-CM

## 2017-01-06 DIAGNOSIS — R7309 Other abnormal glucose: Secondary | ICD-10-CM

## 2017-01-06 DIAGNOSIS — E559 Vitamin D deficiency, unspecified: Secondary | ICD-10-CM

## 2017-01-06 DIAGNOSIS — N183 Chronic kidney disease, stage 3 unspecified: Secondary | ICD-10-CM

## 2017-01-06 DIAGNOSIS — Z136 Encounter for screening for cardiovascular disorders: Secondary | ICD-10-CM

## 2017-01-06 DIAGNOSIS — I10 Essential (primary) hypertension: Secondary | ICD-10-CM

## 2017-01-06 DIAGNOSIS — E782 Mixed hyperlipidemia: Secondary | ICD-10-CM | POA: Diagnosis not present

## 2017-01-06 DIAGNOSIS — Z0001 Encounter for general adult medical examination with abnormal findings: Secondary | ICD-10-CM

## 2017-01-06 DIAGNOSIS — Z7901 Long term (current) use of anticoagulants: Secondary | ICD-10-CM

## 2017-01-06 DIAGNOSIS — Z79899 Other long term (current) drug therapy: Secondary | ICD-10-CM | POA: Diagnosis not present

## 2017-01-06 DIAGNOSIS — K219 Gastro-esophageal reflux disease without esophagitis: Secondary | ICD-10-CM

## 2017-01-06 NOTE — Patient Instructions (Signed)

## 2017-01-06 NOTE — Progress Notes (Signed)
[facility] [name], M.D.     Uvaldo Bristle. Silverio Lay, P.A.-C Liane Comber, [place] [address] [place], [place] [address] Telephone [phone] Telefax [phone] Annual Screening/Preventative Visit & Comprehensive Evaluation &  Examination     This very nice 76 y.o. MWF presents for a Screening/Preventative Visit & comprehensive evaluation and management of multiple medical co-morbidities.  Patient has been followed for HTN, ASHD/cAfib,  Prediabetes, Hyperlipidemia, Hypothyroidism and Vitamin D Deficiency.  She also has GERD controlled with diet & her meds. Other problems include COPD consequent of her long hx/o smoking and also has Amiodarone induced Pulm. Fibrosis.Her Gout is controlled w/Allopurinol.       HTN predates since 1999. Patient's BP has been controlled at home and patient denies any cardiac symptoms as chest pain, palpitations, shortness of breath, dizziness or ankle swelling. She has CKD3 consequent of her HTN. Today's BP is at goal - 136/84. Patient has hx/o a Non-ischemic Cardiomyopathy /cAfib and is on Coumadin. Lexiscan in 2014 showed EF 50%. She had a AAA endovascular stent/grafty  Placed in Jan 2015.      Patient's hyperlipidemia is controlled with diet and medications. Patient denies myalgias or other medication SE's. Last lipids were at goal albeit elevated rig's: Lab Results  Component Value Date   CHOL 158 01/06/2017   HDL 36 (L) 01/06/2017   LDLCALC 79 09/17/2016   TRIG 147 01/06/2017   CHOLHDL 4.4 01/06/2017      Patient has Morbid Obesity  (BMI 34+) and consequent prediabetes (A1c 5.7% in 2013)  and patient denies reactive hypoglycemic symptoms, visual blurring, diabetic polys, or paresthesias. Last A1c was at goal: Lab Results  Component Value Date   HGBA1C 5.8 (H) 01/06/2017      Patient has been on Thyroid Replacement since 2012. Finally, patient has history of Vitamin D  Deficiency ("33"/2008) and last Vitamin D was at goal: Lab Results  Component Value Date   VD25OH 55 01/06/2017   Current Outpatient Prescriptions on File Prior to Visit  Medication Sig  . acetaminophen (TYLENOL) 500 MG tablet Take 1,000 mg by mouth 2 (two) times daily as needed for moderate pain.  Marland Kitchen albuterol (PROVENTIL HFA;VENTOLIN HFA) 108 (90 Base) MCG/ACT inhaler Inhale 2 puffs into the lungs every 4 (four) hours as needed for shortness of breath. Inhale 2 puffs 5-10 min apart every 4 to 6 hrs- to rescue asthma  . allopurinol (ZYLOPRIM) 300 MG tablet TAKE 1 TABLET BY MOUTH EVERY DAY  . budesonide (PULMICORT) 0.25 MG/2ML nebulizer solution Take 2 mLs (0.25 mg total) by nebulization 2 (two) times daily.  . cetirizine (ZYRTEC) 10 MG tablet TAKE 1 CAPSULE (10 MG TOTAL) BY MOUTH AT BEDTIME.  . digoxin (LANOXIN) 0.25 MG tablet TAKE 1 TABLET BY MOUTH EVERY DAY  . diltiazem (TIAZAC) 120 MG 24 hr capsule TAKE 1 CAPSULE BY MOUTH EVERY DAY  . Fluticasone-Umeclidin-Vilant (TRELEGY ELLIPTA) 100-62.5-25 MCG/INH AEPB Inhale 1 puff into the lungs daily.  . furosemide (LASIX) 80 MG tablet TAKE 1 TABLET BY MOUTH TWICE A DAY AS NEEDED FOR FLUID  . gabapentin (NEURONTIN) 600 MG tablet Takes 1/2 to 1 tablet 3 times daily.  Marland Kitchen levothyroxine (SYNTHROID, LEVOTHROID) 50 MCG tablet TAKE 1 TABLET EVERY DAY (Patient taking differently: TAKE 25 MG THREE A WEEK THEN TAKE 50 MG 4 TIME A  WEEK)  . montelukast (SINGULAIR) 10 MG tablet TAKE 1 TABLET EVERY DAY FOR ALLERGIES  . pantoprazole (PROTONIX) 40 MG tablet  Take 1 tablet (40 mg total) by mouth daily.  . potassium chloride SA (KLOR-CON M20) 20 MEQ tablet Take 2 tablets (40 mEq total) by mouth daily.  . pravastatin (PRAVACHOL) 40 MG tablet TAKE 1 TABLET BY MOUTH AT BEDTIME FOR CHOLESTEROL  . prednisoLONE acetate (PRED FORTE) 1 % ophthalmic suspension INSTILL 1 DROP INTO BOTH EYES TWICE A DAY  . prochlorperazine (COMPAZINE) 5 MG tablet TAKE 1 TABLET BY MOUTH THREE TIMES  A DAY AS NEEDED FOR VERTIGO OR NAUSEA  . triamcinolone cream (KENALOG) 0.1 % Apply 1 application topically 2 (two) times daily. Apply to rash  2 x / day  . warfarin (COUMADIN) 2 MG tablet TAKE 1 TO 2 TABLETS BY MOUTH DAILY OR AS DIRECTED  . warfarin (COUMADIN) 3 MG tablet Take 3 mg by mouth daily.   No current facility-administered medications on file prior to visit.    Allergies  Allergen Reactions  . Amiodarone Other (See Comments)    PULMONARY TOXICITY  . Diovan [Valsartan] Other (See Comments)    HYPOTENSION  . Doxycycline Diarrhea and Other (See Comments)    VISUAL DISTURBANCE  . Flexeril [Cyclobenzaprine] Other (See Comments)    FATIGUE  . Keflex [Cephalexin] Diarrhea  . Verapamil Other (See Comments)    EDEMA  . Codeine Other (See Comments)    unknown   Past Medical History:  Diagnosis Date  . Atrial fibrillation ([place])   . Migraine headache   . Osteopenia    Health Maintenance  Topic Date Due  . INFLUENZA VACCINE  10/13/2016  . TETANUS/TDAP  03/15/2017  . MAMMOGRAM  09/23/2018  . DEXA SCAN  09/14/2020  . COLONOSCOPY  03/15/2022  . PNA vac Low Risk Adult  Completed   Immunization History  Administered Date(s) Administered  . Influenza, High Dose Seasonal PF 12/20/2013, 12/09/2014, 11/14/2015  . Influenza-Unspecified 01/02/2013  . PPD Test 07/17/2016  . Pneumococcal Conjugate-13 01/23/2014  . Pneumococcal-Unspecified 03/15/1993, 05/31/2008  . Td 03/16/2007  . Varicella 02/19/2008   Past Surgical History:  Procedure Laterality Date  . ABDOMINAL AORTIC ENDOVASCULAR STENT GRAFT N/A 04/11/2013   Procedure: ABDOMINAL AORTIC ENDOVASCULAR STENT GRAFT WITH RIGHT FEMORAL PATCH ANGIOPLASTY;  Surgeon: Mal Misty, MD;  Location: [facility];  Service: Vascular;  Laterality: N/A;  . BREAST SURGERY     LEFT BREAST BIOPSY  . broken leg Left 1970s  . CARDIAC CATHETERIZATION    . CHOLECYSTECTOMY  1972  . COLONOSCOPY    . EYE SURGERY Bilateral   . Laser vein procedure    .  REFRACTIVE SURGERY    . TONSILLECTOMY     Family History  Problem Relation Age of Onset  . Cirrhosis Mother   . Cancer Mother 71       PANCREAS  . Heart defect Sister   . Breast cancer Sister        age 34  . Heart disease Sister   . Stroke Sister   . Alcohol abuse Father   . Depression Father   . Hypertension Brother   . Hyperlipidemia Son   . Heart disease Daughter    Social History  Substance Use Topics  . Smoking status: Former Smoker    Types: Cigarettes    Quit date: 08/10/2002  . Smokeless tobacco: Never Used     Comment: History of tobacco abuse  . Alcohol use 0.0 oz/week     Comment: rare    ROS Constitutional: Denies fever, chills, weight loss/gain, headaches, insomnia,  night sweats, and change  in appetite. Does c/o fatigue. Eyes: Denies redness, blurred vision, diplopia, discharge, itchy, watery eyes.  ENT: Denies discharge, congestion, post nasal drip, epistaxis, sore throat, earache, hearing loss, dental pain, Tinnitus, Vertigo, Sinus pain, snoring.  Cardio: Denies chest pain, palpitations, irregular heartbeat, syncope, dyspnea, diaphoresis, orthopnea, PND, claudication, edema Respiratory: denies cough, dyspnea, DOE, pleurisy, hoarseness, laryngitis, wheezing.  Gastrointestinal: Denies dysphagia, heartburn, reflux, water brash, pain, cramps, nausea, vomiting, bloating, diarrhea, constipation, hematemesis, melena, hematochezia, jaundice, hemorrhoids Genitourinary: Denies dysuria, frequency, urgency, nocturia, hesitancy, discharge, hematuria, flank pain Breast: Breast lumps, nipple discharge, bleeding.  Musculoskeletal: Denies arthralgia, myalgia, stiffness, Jt. Swelling, pain, limp, and strain/sprain. Denies falls. Skin: Denies puritis, rash, hives, warts, acne, eczema, changing in skin lesion Neuro: No weakness, tremor, incoordination, spasms, paresthesia, pain Psychiatric: Denies confusion, memory loss, sensory loss. Denies Depression. Endocrine: Denies change  in weight, skin, hair change, nocturia, and paresthesia, diabetic polys, visual blurring, hyper / hypo glycemic episodes.  Heme/Lymph: No excessive bleeding, bruising, enlarged lymph nodes.  Physical Exam  BP 136/84   Pulse 73   Temp (!) 97.3 F (36.3 C)   Resp 14   Ht 5' 3.5" (1.613 m)   Wt 196 lb 3.2 oz (89 kg)   SpO2 94%   BMI 34.21 kg/m   General Appearance: Over nourished, well groomed and in no apparent distress.  Eyes: PERRLA, EOMs, conjunctiva no swelling or erythema, normal fundi and vessels. Sinuses: No frontal/maxillary tenderness ENT/Mouth: EACs patent / TMs  nl. Nares clear without erythema, swelling, mucoid exudates. Oral hygiene is good. No erythema, swelling, or exudate. Tongue normal, non-obstructing. Tonsils not swollen or erythematous. Hearing normal.  Neck: Supple, thyroid normal. No bruits, nodes or JVD. Respiratory: Respiratory effort normal.  BS equal and clear bilateral without rales, rhonci, wheezing or stridor. Cardio: Heart sounds are soft with irregular rate and rhythm and a soft sys murmur. Peripheral pulses are normal and equal bilaterally without edema. No aortic or femoral bruits. Chest: symmetric with normal excursions and percussion. Breasts: Symmetric, without lumps, nipple discharge, retractions, or fibrocystic changes.  Abdomen: Flat, soft with bowel sounds active. Nontender, no guarding, rebound, hernias, masses, or organomegaly.  Lymphatics: Non tender without lymphadenopathy.  Genitourinary:  Musculoskeletal: Full ROM all peripheral extremities, joint stability, 5/5 strength, and normal gait. Skin: Warm and dry without rashes, lesions, cyanosis, clubbing or  ecchymosis.  Neuro: Cranial nerves intact, reflexes equal bilaterally. Normal muscle tone, no cerebellar symptoms. Sensation intact.  Pysch: Alert and oriented X 3, normal affect, Insight and Judgment appropriate.   Assessment and Plan  1. Annual Preventative Screening Examination  2.  Essential hypertension  - EKG 12-Lead - [place], RETROPERITNL ABD,  LTD - Urinalysis, Routine w reflex microscopic - Microalbumin / creatinine urine ratio - CBC with Differential/Platelet - BASIC METABOLIC PANEL WITH GFR - Magnesium - TSH  3. Hyperlipidemia, mixed  - EKG 12-Lead - [place], RETROPERITNL ABD,  LTD - Hepatic function panel - Lipid panel - TSH  4. Other abnormal glucose  - Hemoglobin A1c - Insulin, random  5. Vitamin D deficiency  - VITAMIN D 25 Hydroxy  6. Hypothyroidism  - TSH  7. Atherosclerosis of native coronary artery of native heart without angina pectoris  - EKG 12-Lead  8. Chronic atrial fibrillation (HCC)  - EKG 12-Lead - Protime-INR - TSH - Digoxin level  9. Gastroesophageal reflux disease   10. CKD  stage III (GFR 51 ml/min)  - Urinalysis, Routine w reflex microscopic - Microalbumin / creatinine urine ratio - BASIC METABOLIC  PANEL WITH GFR  11. Pulmonary Fibrosis sequellae of Amiodarone   12. AAA (abdominal aortic aneurysm) without rupture (HCC)  - [place], RETROPERITNL ABD,  LTD  13. PVD (peripheral vascular disease) with claudication (HCC)  - [place], RETROPERITNL ABD,  LTD - Lipid panel  14. COPD (chronic obstructive pulmonary disease) with chronic bronchitis ([place])   15. Morbid obesity due to excess calories (HCC)  - Hemoglobin A1c - Insulin, random  16. Chronic gout of foot  - Uric acid  17. Screening for colorectal cancer  - POC Hemoccult Bld/Stl  18. Screening for ischemic heart disease  - EKG 12-Lead  19. Screening for abdominal aortic aneurysm  - [place], RETROPERITNL ABD,  LTD  20. Long term current use of anticoagulant therapy  - Protime-INR  21. Medication management  - Urinalysis, Routine w reflex microscopic - Microalbumin / creatinine urine ratio - Uric acid - Protime-INR - CBC with Differential/Platelet - BASIC METABOLIC PANEL WITH GFR - Hepatic function panel - Magnesium - Lipid panel - TSH -  Hemoglobin A1c - Insulin, random - VITAMIN D 25 Hydroxy  - Digoxin level  22. Urinary tract infection without hematuria  - Urine Culture      Patient was counseled in prudent diet to achieve/maintain BMI less than 25 for weight control, BP monitoring, regular exercise and medications. Discussed med's effects and SE's. Screening labs and tests as requested with regular follow-up as recommended. Over 40 minutes of exam, counseling, chart review and high complex critical decision making was performed.

## 2017-01-07 ENCOUNTER — Other Ambulatory Visit: Payer: Self-pay | Admitting: Internal Medicine

## 2017-01-07 ENCOUNTER — Telehealth (INDEPENDENT_AMBULATORY_CARE_PROVIDER_SITE_OTHER): Payer: Self-pay | Admitting: Orthopaedic Surgery

## 2017-01-07 DIAGNOSIS — N390 Urinary tract infection, site not specified: Secondary | ICD-10-CM | POA: Diagnosis not present

## 2017-01-07 LAB — BASIC METABOLIC PANEL WITH GFR
BUN/Creatinine Ratio: 20 (calc) (ref 6–22)
BUN: 25 mg/dL (ref 7–25)
CALCIUM: 9.9 mg/dL (ref 8.6–10.4)
CHLORIDE: 101 mmol/L (ref 98–110)
CO2: 26 mmol/L (ref 20–32)
Creat: 1.27 mg/dL — ABNORMAL HIGH (ref 0.60–0.93)
GFR, EST AFRICAN AMERICAN: 48 mL/min/{1.73_m2} — AB (ref >=60)
GFR, EST NON AFRICAN AMERICAN: 42 mL/min/{1.73_m2} — AB (ref >=60)
Glucose, Bld: 97 mg/dL (ref 65–99)
Potassium: 4.2 mmol/L (ref 3.5–5.3)
SODIUM: 138 mmol/L (ref 135–146)

## 2017-01-07 LAB — HEPATIC FUNCTION PANEL
AG Ratio: 1.7 (calc) (ref 1.0–2.5)
ALKALINE PHOSPHATASE (APISO): 46 U/L (ref 33–130)
ALT: 12 U/L (ref 6–29)
AST: 17 U/L (ref 10–35)
Albumin: 4.2 g/dL (ref 3.6–5.1)
BILIRUBIN DIRECT: 0.1 mg/dL (ref 0.0–0.2)
BILIRUBIN TOTAL: 0.6 mg/dL (ref 0.2–1.2)
Globulin: 2.5 g/dL (calc) (ref 1.9–3.7)
Indirect Bilirubin: 0.5 mg/dL (calc) (ref 0.2–1.2)
Total Protein: 6.7 g/dL (ref 6.1–8.1)

## 2017-01-07 LAB — URINALYSIS, ROUTINE W REFLEX MICROSCOPIC
BILIRUBIN URINE: NEGATIVE
GLUCOSE, UA: NEGATIVE
HGB URINE DIPSTICK: NEGATIVE
HYALINE CAST: NONE SEEN /LPF
Ketones, ur: NEGATIVE
NITRITE: NEGATIVE
PROTEIN: NEGATIVE
Specific Gravity, Urine: 1.015 (ref 1.001–1.03)
pH: <=5 (ref 5.0–8.0)

## 2017-01-07 LAB — CBC WITH DIFFERENTIAL/PLATELET
Basophils Absolute: 85 cells/uL (ref 0–200)
Basophils Relative: 0.9 %
EOS ABS: 348 {cells}/uL (ref 15–500)
Eosinophils Relative: 3.7 %
HEMATOCRIT: 42.9 % (ref 35.0–45.0)
Hemoglobin: 14.6 g/dL (ref 11.7–15.5)
LYMPHS ABS: 2096 {cells}/uL (ref 850–3900)
MCH: 29.3 pg (ref 27.0–33.0)
MCHC: 34 g/dL (ref 32.0–36.0)
MCV: 86.1 fL (ref 80.0–100.0)
MONOS PCT: 8.9 %
MPV: 10.8 fL (ref 7.5–12.5)
NEUTROS ABS: 6035 {cells}/uL (ref 1500–7800)
Neutrophils Relative %: 64.2 %
PLATELETS: 226 10*3/uL (ref 140–400)
RBC: 4.98 10*6/uL (ref 3.80–5.10)
RDW: 15 % (ref 11.0–15.0)
Total Lymphocyte: 22.3 %
WBC: 9.4 10*3/uL (ref 3.8–10.8)
WBCMIX: 837 {cells}/uL (ref 200–950)

## 2017-01-07 LAB — LIPID PANEL
Cholesterol: 158 mg/dL (ref <200)
HDL: 36 mg/dL — ABNORMAL LOW (ref >50)
LDL Cholesterol (Calc): 98 mg/dL (calc)
NON-HDL CHOLESTEROL (CALC): 122 mg/dL (ref <130)
Total CHOL/HDL Ratio: 4.4 (calc) (ref <5.0)
Triglycerides: 147 mg/dL (ref <150)

## 2017-01-07 LAB — DIGOXIN LEVEL: Digoxin Level: 1.2 mcg/L (ref 0.8–2.0)

## 2017-01-07 LAB — HEMOGLOBIN A1C
EAG (MMOL/L): 6.6 (calc)
HEMOGLOBIN A1C: 5.8 %{Hb} — AB (ref <5.7)
Mean Plasma Glucose: 120 (calc)

## 2017-01-07 LAB — TSH: TSH: 7.97 m[IU]/L — AB (ref 0.40–4.50)

## 2017-01-07 LAB — MICROALBUMIN / CREATININE URINE RATIO
Creatinine, Urine: 76 mg/dL (ref 20–275)
Microalb Creat Ratio: 5 mcg/mg creat (ref <30)
Microalb, Ur: 0.4 mg/dL

## 2017-01-07 LAB — INSULIN, RANDOM: INSULIN: 29.5 u[IU]/mL — AB (ref 2.0–19.6)

## 2017-01-07 LAB — PROTIME-INR
INR: 3.6 — ABNORMAL HIGH
PROTHROMBIN TIME: 38 s — AB (ref 9.0–11.5)

## 2017-01-07 LAB — MAGNESIUM: MAGNESIUM: 2.2 mg/dL (ref 1.5–2.5)

## 2017-01-07 LAB — URIC ACID: Uric Acid, Serum: 5.4 mg/dL (ref 2.5–7.0)

## 2017-01-07 LAB — VITAMIN D 25 HYDROXY (VIT D DEFICIENCY, FRACTURES): Vit D, 25-Hydroxy: 55 ng/mL (ref 30–100)

## 2017-01-07 NOTE — Telephone Encounter (Signed)
Patient called wanting to know the status of her injections, I believe she said it was called Monovisc. CB#[phone].  Thank you.

## 2017-01-08 LAB — URINE CULTURE
MICRO NUMBER: 81203841
Result:: NO GROWTH
SPECIMEN QUALITY: ADEQUATE

## 2017-01-09 ENCOUNTER — Encounter: Payer: Self-pay | Admitting: Internal Medicine

## 2017-01-09 MED ORDER — VITAMIN D3 125 MCG (5000 UT) PO CAPS: ORAL_CAPSULE | ORAL | Status: DC

## 2017-01-10 NOTE — Telephone Encounter (Signed)
Patient aware I haven't heard anything from [place] yet, I can't do injection until I receive Monovisc

## 2017-01-17 ENCOUNTER — Other Ambulatory Visit: Payer: Self-pay | Admitting: Physician Assistant

## 2017-01-19 ENCOUNTER — Other Ambulatory Visit: Payer: Self-pay | Admitting: *Deleted

## 2017-01-19 MED ORDER — FUROSEMIDE 80 MG PO TABS: ORAL_TABLET | ORAL | 1 refills | Status: DC

## 2017-01-23 ENCOUNTER — Other Ambulatory Visit: Payer: Self-pay | Admitting: Physician Assistant

## 2017-01-30 ENCOUNTER — Other Ambulatory Visit: Payer: Self-pay | Admitting: Internal Medicine

## 2017-01-31 ENCOUNTER — Other Ambulatory Visit: Payer: Self-pay | Admitting: Internal Medicine

## 2017-01-31 DIAGNOSIS — H01022 Squamous blepharitis right lower eyelid: Secondary | ICD-10-CM | POA: Diagnosis not present

## 2017-01-31 DIAGNOSIS — H01021 Squamous blepharitis right upper eyelid: Secondary | ICD-10-CM | POA: Diagnosis not present

## 2017-01-31 DIAGNOSIS — H10503 Unspecified blepharoconjunctivitis, bilateral: Secondary | ICD-10-CM | POA: Diagnosis not present

## 2017-01-31 DIAGNOSIS — H04123 Dry eye syndrome of bilateral lacrimal glands: Secondary | ICD-10-CM | POA: Diagnosis not present

## 2017-01-31 DIAGNOSIS — H01024 Squamous blepharitis left upper eyelid: Secondary | ICD-10-CM | POA: Diagnosis not present

## 2017-02-08 NOTE — Progress Notes (Signed)
Assessment and Plan:  Chronic atrial fibrillation (HCC) Check INR and will adjust medication according to labs.  Discussed if patient falls to immediately contact office or go to ER. Discussed foods that can increase or decrease Coumadin levels. Patient understands to call the office before starting a new medication. Follow up in one month.   Chronic systolic CHF (congestive heart failure) (HCC) Emphasized salt restriction, less than 2000mg  a day. Encouraged daily monitoring of the patient's weight, call office if 5 lb weight loss or gain in a day.  Encouraged regular exercise. If any increasing shortness of breath, swelling, or chest pressure go to ER immediately.  decrease your fluid intake to less than 2 L daily please remember to always increase your potassium intake with any increase of your fluid pill. - follow up with cardiology this Friday as scheduled    CKD  stage III (GFR 51 ml/min) Increase fluids, avoid NSAIDS, monitor sugars, will monitor  Long term current use of anticoagulant therapy INR   Hypothyroidism, unspecified type Med was not adjusted at last visit despite high TSH due to typically stable values; verified how she has been taking reminded to take on an empty stomach 30-56mins before food.  check TSH level- may adjust pending results  Chest congestion Very mild symptoms, patient would like to postpone treatment for now; will call office should symptoms increase- zpak printed and provided, instructed to utilize neb which she has not been doing Information for symptomatic treatment provided Reminded to call office or present to ER with significant dyspnea  Further disposition pending results of labs. Discussed med's effects and SE's.   Over 30 minutes of exam, counseling, chart review, and critical decision making was performed.   Future Appointments  Date Time Provider [place]  02/09/2017 11:00 AM Liane Comber, NP [facility] None  02/11/2017  11:30 AM Evans Lance, MD [facility]  04/26/2017  8:00 AM [facility] 4 [facility]  04/26/2017  9:00 AM [facility] 4 [facility]  04/26/2017  9:45 AM Nickel, Sharmon Leyden, NP [facility]  02/02/2018  9:00 AM [name], MD [facility] None    ------------------------------------------------------------------------------------------------------------------   HPI BP 116/76   Pulse 82   Temp (!) 97 F (36.1 C)   Ht 5' 3.5" (1.613 m)   Wt 200 lb (90.7 kg)   SpO2 96%   BMI 34.87 kg/m   76 y.o.female presents for 4 week follow up for coumadin management, as well as management of hypothyroid - TSH abnormal at last visit, and monitoring of CKD stage 3. She reports she is fighting some chest congestion x 3 days - coughing up scant yellow discharge - family members reporting similar symptoms. She has been taking zyrtec, tylenol. She denies fever/chills, cough, chest pain/palpitations. She endorses some mild SOB increase from baseline - she endorses 3 lb weight gain - she has increased lasix to 120 mg- she will be following up with Dr. Lovena Le (cardiology) this Friday.   BMI is Body mass index is 34.87 kg/m.  Wt Readings from Last 3 Encounters:  02/09/17 200 lb (90.7 kg)  01/06/17 196 lb 3.2 oz (89 kg)  11/18/16 194 lb (88 kg)   Patient is on Coumadin for Chronic atrial fibrillation ([place]) [I48.2] Patient's last INR is  Lab Results  Component Value Date   INR 3.6 (H) 01/06/2017   INR 3.1 (H) 11/18/2016   INR 1.9 (H) 10/19/2016    Patient denies SOB, CP, dizziness, nose bleeds, easy bleeding,  and blood in stool/urine. Her coumadin dose was changed last visit. She has taken ABX- topical for eye infection (by ophthalmologist), has not missed any doses and denies a fall.    1/2 tab 2 x/ week on Mon/Friday and 1 tab other 5 days  She is on thyroid medication. Her medication was not changed last visit but was out of range:    Lab Results  Component Value Date   TSH  7.97 (H) 01/06/2017  She is currently prescribed 50 mcg levothyroxine - takes 1.5 tabs on Mondays-  and has been taking consistently.    Past Medical History:  Diagnosis Date  . Atrial fibrillation ([place])   . Migraine headache   . Osteopenia      Allergies  Allergen Reactions  . Amiodarone Other (See Comments)    PULMONARY TOXICITY  . Diovan [Valsartan] Other (See Comments)    HYPOTENSION  . Doxycycline Diarrhea and Other (See Comments)    VISUAL DISTURBANCE  . Flexeril [Cyclobenzaprine] Other (See Comments)    FATIGUE  . Keflex [Cephalexin] Diarrhea  . Verapamil Other (See Comments)    EDEMA  . Codeine Other (See Comments)    unknown    Current Outpatient Medications on File Prior to Visit  Medication Sig  . acetaminophen (TYLENOL) 500 MG tablet Take 1,000 mg by mouth 2 (two) times daily as needed for moderate pain.  Marland Kitchen albuterol (PROVENTIL HFA;VENTOLIN HFA) 108 (90 Base) MCG/ACT inhaler Inhale 2 puffs into the lungs every 4 (four) hours as needed for shortness of breath. Inhale 2 puffs 5-10 min apart every 4 to 6 hrs- to rescue asthma  . allopurinol (ZYLOPRIM) 300 MG tablet TAKE 1 TABLET BY MOUTH EVERY DAY  . budesonide (PULMICORT) 0.25 MG/2ML nebulizer solution Take 2 mLs (0.25 mg total) by nebulization 2 (two) times daily.  . cetirizine (ZYRTEC) 10 MG tablet TAKE 1 CAPSULE (10 MG TOTAL) BY MOUTH AT BEDTIME.  Marland Kitchen Cholecalciferol (VITAMIN D3) 5000 units CAPS Takes 2 caps (10,000 units) daily  . digoxin (LANOXIN) 0.25 MG tablet TAKE 1 TABLET BY MOUTH EVERY DAY  . diltiazem (TIAZAC) 120 MG 24 hr capsule TAKE 1 CAPSULE BY MOUTH EVERY DAY  . Fluticasone-Umeclidin-Vilant (TRELEGY ELLIPTA) 100-62.5-25 MCG/INH AEPB Inhale 1 puff into the lungs daily.  . furosemide (LASIX) 80 MG tablet TAKE 1 TABLET BY MOUTH TWICE A DAY AS NEEDED FOR FLUID  . gabapentin (NEURONTIN) 600 MG tablet Takes 1/2 to 1 tablet 3 times daily.  Marland Kitchen KLOR-CON M20 20 MEQ tablet TAKE 2 TABLETS (40 MEQ TOTAL) BY MOUTH  DAILY.  Marland Kitchen levothyroxine (SYNTHROID, LEVOTHROID) 50 MCG tablet TAKE 1 TABLET BY MOUTH EVERY DAY  . montelukast (SINGULAIR) 10 MG tablet TAKE 1 TABLET EVERY DAY FOR ALLERGIES  . pantoprazole (PROTONIX) 40 MG tablet Take 1 tablet (40 mg total) by mouth daily.  . pravastatin (PRAVACHOL) 40 MG tablet TAKE 1 TABLET BY MOUTH AT BEDTIME FOR CHOLESTEROL  . prednisoLONE acetate (PRED FORTE) 1 % ophthalmic suspension INSTILL 1 DROP INTO BOTH EYES TWICE A DAY  . prochlorperazine (COMPAZINE) 5 MG tablet TAKE 1 TABLET BY MOUTH THREE TIMES A DAY AS NEEDED FOR VERTIGO OR NAUSEA  . triamcinolone cream (KENALOG) 0.1 % Apply 1 application topically 2 (two) times daily. Apply to rash  2 x / day  . warfarin (COUMADIN) 2 MG tablet TAKE 1 TO 2 TABLETS BY MOUTH DAILY OR AS DIRECTED  . warfarin (COUMADIN) 3 MG tablet Take 3 mg by mouth daily.   No current  facility-administered medications on file prior to visit.     ROS: Review of Systems  Constitutional: Negative for chills, fever, malaise/fatigue and weight loss.  HENT: Negative for congestion, hearing loss, sinus pain, sore throat and tinnitus.   Eyes: Negative for blurred vision and double vision.  Respiratory: Positive for cough (Rare, productive of scant mucus) and shortness of breath (Mild increase from baseline). Negative for wheezing.   Cardiovascular: Negative for chest pain, palpitations, orthopnea, claudication and leg swelling.  Gastrointestinal: Negative for abdominal pain, blood in stool, constipation, diarrhea, heartburn, melena, nausea and vomiting.  Genitourinary: Negative.   Musculoskeletal: Negative for joint pain and myalgias.  Skin: Negative for rash.  Neurological: Negative for dizziness, tingling, sensory change, weakness and headaches.  Endo/Heme/Allergies: Negative for polydipsia.  Psychiatric/Behavioral: Negative.   All other systems reviewed and are negative.   Physical Exam:  There were no vitals taken for this visit.  General  Appearance: Well nourished, in no apparent distress. Eyes: PERRLA, EOMs, conjunctiva mildly erythematous - per patient significantly improved Sinuses: No Frontal/maxillary tenderness ENT/Mouth: Ext aud canals clear, TMs without erythema, bulging. No erythema, swelling, or exudate on post pharynx.  Tonsils not swollen or erythematous. Hearing normal.  Neck: Supple, thyroid normal.  Respiratory: Respiratory effort somewhat incrased, intermittnet pursed lip breathing, BS equal bilaterally without rales, rhonchi, wheezing or stridor.  Cardio: Rate irregular, S1/S2 audible with systolic murmur 3/6 heard loudest over R 2nd ICS. Thready symmetrical distal pulses with 1+ pitting edema to bilateral LE.  Abdomen: Soft, + BS.  Non tender, no guarding, rebound, hernias, masses. Lymphatics: Non tender without lymphadenopathy.  Musculoskeletal: Full ROM, 5/5 strength, normal gait.  Skin: Warm, dry without rashes, lesions, ecchymosis.  Neuro: Cranial nerves intact. Normal muscle tone, no cerebellar symptoms. Sensation intact.  Psych: Awake and oriented X 3, normal affect, Insight and Judgment appropriate.     Izora Ribas, NP 1:49 PM [facility]

## 2017-02-09 ENCOUNTER — Ambulatory Visit (INDEPENDENT_AMBULATORY_CARE_PROVIDER_SITE_OTHER): Payer: Medicare Other | Admitting: Adult Health

## 2017-02-09 ENCOUNTER — Encounter: Payer: Self-pay | Admitting: Adult Health

## 2017-02-09 VITALS — BP 116/76 | HR 82 | Temp 97.0°F | Ht 63.5 in | Wt 200.0 lb

## 2017-02-09 DIAGNOSIS — N183 Chronic kidney disease, stage 3 unspecified: Secondary | ICD-10-CM

## 2017-02-09 DIAGNOSIS — I482 Chronic atrial fibrillation, unspecified: Secondary | ICD-10-CM

## 2017-02-09 DIAGNOSIS — Z7901 Long term (current) use of anticoagulants: Secondary | ICD-10-CM | POA: Diagnosis not present

## 2017-02-09 DIAGNOSIS — I5022 Chronic systolic (congestive) heart failure: Secondary | ICD-10-CM | POA: Diagnosis not present

## 2017-02-09 DIAGNOSIS — E039 Hypothyroidism, unspecified: Secondary | ICD-10-CM | POA: Diagnosis not present

## 2017-02-09 DIAGNOSIS — R0989 Other specified symptoms and signs involving the circulatory and respiratory systems: Secondary | ICD-10-CM

## 2017-02-09 MED ORDER — AZITHROMYCIN 250 MG PO TABS: ORAL_TABLET | ORAL | 1 refills | Status: DC

## 2017-02-09 NOTE — Patient Instructions (Signed)

## 2017-02-10 ENCOUNTER — Other Ambulatory Visit: Payer: Self-pay | Admitting: Adult Health

## 2017-02-10 DIAGNOSIS — Z79899 Other long term (current) drug therapy: Secondary | ICD-10-CM

## 2017-02-10 DIAGNOSIS — M1A9XX0 Chronic gout, unspecified, without tophus (tophi): Secondary | ICD-10-CM

## 2017-02-10 LAB — BASIC METABOLIC PANEL WITH GFR
BUN / CREAT RATIO: 15 (calc) (ref 6–22)
BUN: 23 mg/dL (ref 7–25)
CHLORIDE: 102 mmol/L (ref 98–110)
CO2: 25 mmol/L (ref 20–32)
Calcium: 9.9 mg/dL (ref 8.6–10.4)
Creat: 1.54 mg/dL — ABNORMAL HIGH (ref 0.60–0.93)
GFR, EST AFRICAN AMERICAN: 38 mL/min/{1.73_m2} — AB (ref >=60)
GFR, EST NON AFRICAN AMERICAN: 33 mL/min/{1.73_m2} — AB (ref >=60)
Glucose, Bld: 92 mg/dL (ref 65–99)
POTASSIUM: 4.3 mmol/L (ref 3.5–5.3)
SODIUM: 138 mmol/L (ref 135–146)

## 2017-02-10 LAB — PROTIME-INR
INR: 1.3 — ABNORMAL HIGH
Prothrombin Time: 14.1 s — ABNORMAL HIGH (ref 9.0–11.5)

## 2017-02-10 LAB — TSH: TSH: 8.76 mIU/L — ABNORMAL HIGH (ref 0.40–4.50)

## 2017-02-10 MED ORDER — ALLOPURINOL 300 MG PO TABS: 150.0000 mg | ORAL_TABLET | Freq: Every day | ORAL | 1 refills | Status: DC

## 2017-02-11 ENCOUNTER — Ambulatory Visit: Payer: Medicare Other | Admitting: Internal Medicine

## 2017-02-11 ENCOUNTER — Encounter: Payer: Self-pay | Admitting: Internal Medicine

## 2017-02-11 ENCOUNTER — Other Ambulatory Visit: Payer: Self-pay | Admitting: Internal Medicine

## 2017-02-11 ENCOUNTER — Telehealth: Payer: Self-pay | Admitting: *Deleted

## 2017-02-11 VITALS — BP 120/68 | HR 84 | Ht 63.5 in | Wt 200.0 lb

## 2017-02-11 DIAGNOSIS — I5042 Chronic combined systolic (congestive) and diastolic (congestive) heart failure: Secondary | ICD-10-CM

## 2017-02-11 DIAGNOSIS — I482 Chronic atrial fibrillation: Secondary | ICD-10-CM

## 2017-02-11 DIAGNOSIS — I4821 Permanent atrial fibrillation: Secondary | ICD-10-CM

## 2017-02-11 NOTE — Telephone Encounter (Signed)
Patient called to review labs and medication changes from 02/09/2017 lab results.  The patient wrote down all instructions, but states she is already taking the Levothyroxine 50 mcg 1.5 x 3 days and 1 tablet x 4 days. Pet Dr Melford Aase, change to 50 mcg 1.5 tablets 5 days a week and 1 tablet 2 days a week.

## 2017-02-11 NOTE — Progress Notes (Signed)
HPI Katie [name] returns today for ongoing evaluation and management of atrial fibrillation. She is a very pleasant 76 year old woman who also has an abdominal aortic aneurysm, who underwent endovascular stent grafting in 2015. The patient has chronic systolic and diastolic heart failure which has been fairly well-controlled along with some venous insufficiency. She has not had syncope. She has minimal if any palpitations. She does have chronic chest pressure which is not due to coronary disease. Neurologically, she is back to her usual self. Allergies  Allergen Reactions  . Amiodarone Other (See Comments)    PULMONARY TOXICITY  . Diovan [Valsartan] Other (See Comments)    HYPOTENSION  . Doxycycline Diarrhea and Other (See Comments)    VISUAL DISTURBANCE  . Flexeril [Cyclobenzaprine] Other (See Comments)    FATIGUE  . Keflex [Cephalexin] Diarrhea  . Verapamil Other (See Comments)    EDEMA  . Codeine Other (See Comments)    unknown     Current Outpatient Medications  Medication Sig Dispense Refill  . acetaminophen (TYLENOL) 500 MG tablet Take 1,000 mg by mouth 2 (two) times daily as needed for moderate pain.    Marland Kitchen albuterol (PROVENTIL HFA;VENTOLIN HFA) 108 (90 Base) MCG/ACT inhaler Inhale 2 puffs into the lungs every 4 (four) hours as needed for shortness of breath. Inhale 2 puffs 5-10 min apart every 4 to 6 hrs- to rescue asthma 60 g 3  . allopurinol (ZYLOPRIM) 300 MG tablet Take 0.5 tablets (150 mg total) by mouth daily. 90 tablet 1  . budesonide (PULMICORT) 0.25 MG/2ML nebulizer solution Take 2 mLs (0.25 mg total) by nebulization 2 (two) times daily. 60 mL 12  . cetirizine (ZYRTEC) 10 MG tablet TAKE 1 CAPSULE (10 MG TOTAL) BY MOUTH AT BEDTIME. 90 tablet 3  . Cholecalciferol (VITAMIN D3) 5000 units CAPS Takes 2 caps (10,000 units) daily    . digoxin (LANOXIN) 0.25 MG tablet Take 0.125 mg by mouth daily.    Marland Kitchen diltiazem (TIAZAC) 120 MG 24 hr capsule TAKE 1 CAPSULE BY MOUTH EVERY  DAY 90 capsule 1  . Fluticasone-Umeclidin-Vilant (TRELEGY ELLIPTA) 100-62.5-25 MCG/INH AEPB Inhale 1 puff into the lungs daily. 60 each 3  . furosemide (LASIX) 80 MG tablet TAKE 1 TABLET BY MOUTH TWICE A DAY AS NEEDED FOR FLUID (Patient taking differently: 120 mg. TAKE 1 TABLET BY MOUTH TWICE A DAY AS NEEDED FOR FLUID) 180 tablet 1  . gabapentin (NEURONTIN) 600 MG tablet Takes 1/2 to 1 tablet 3 times daily. 270 tablet 1  . KLOR-CON M20 20 MEQ tablet TAKE 2 TABLETS (40 MEQ TOTAL) BY MOUTH DAILY. 180 tablet 1  . levothyroxine (SYNTHROID, LEVOTHROID) 50 MCG tablet TAKE 1 TABLET BY MOUTH EVERY DAY 90 tablet 1  . montelukast (SINGULAIR) 10 MG tablet TAKE 1 TABLET EVERY DAY FOR ALLERGIES 90 tablet 1  . Olopatadine HCl 0.2 % SOLN Place 1 drop into both eyes daily.  12  . pantoprazole (PROTONIX) 40 MG tablet Take 1 tablet (40 mg total) by mouth daily.    . pravastatin (PRAVACHOL) 40 MG tablet TAKE 1 TABLET BY MOUTH AT BEDTIME FOR CHOLESTEROL 90 tablet 1  . prednisoLONE acetate (PRED FORTE) 1 % ophthalmic suspension INSTILL 1 DROP INTO BOTH EYES TWICE A DAY  1  . prochlorperazine (COMPAZINE) 5 MG tablet TAKE 1 TABLET BY MOUTH THREE TIMES A DAY AS NEEDED FOR VERTIGO OR NAUSEA 60 tablet 0  . triamcinolone cream (KENALOG) 0.1 % Apply 1 application topically 2 (two) times daily. Apply  to rash  2 x / day 80 g 3  . warfarin (COUMADIN) 3 MG tablet Take 3 mg by mouth daily.     No current facility-administered medications for this visit.      Past Medical History:  Diagnosis Date  . Atrial fibrillation ([place])   . Migraine headache   . Osteopenia     ROS:   All systems reviewed and negative except as noted in the HPI.   Past Surgical History:  Procedure Laterality Date  . ABDOMINAL AORTIC ENDOVASCULAR STENT GRAFT N/A 04/11/2013   Procedure: ABDOMINAL AORTIC ENDOVASCULAR STENT GRAFT WITH RIGHT FEMORAL PATCH ANGIOPLASTY;  Surgeon: Mal Misty, MD;  Location: [facility];  Service: Vascular;  Laterality:  N/A;  . BREAST SURGERY     LEFT BREAST BIOPSY  . broken leg Left 1970s  . CARDIAC CATHETERIZATION    . CHOLECYSTECTOMY  1972  . COLONOSCOPY    . EYE SURGERY Bilateral   . Laser vein procedure    . REFRACTIVE SURGERY    . TONSILLECTOMY       Family History  Problem Relation Age of Onset  . Cirrhosis Mother   . Cancer Mother 54       PANCREAS  . Heart defect Sister   . Breast cancer Sister        age 62  . Heart disease Sister   . Stroke Sister   . Alcohol abuse Father   . Depression Father   . Hypertension Brother   . Hyperlipidemia Son   . Heart disease Daughter      Social History   Socioeconomic History  . Marital status: Married    Spouse name: Not on file  . Number of children: Not on file  . Years of education: Not on file  . Highest education level: Not on file  Social Needs  . Financial resource strain: Not on file  . Food insecurity - worry: Not on file  . Food insecurity - inability: Not on file  . Transportation needs - medical: Not on file  . Transportation needs - non-medical: Not on file  Occupational History  . Not on file  Tobacco Use  . Smoking status: Former Smoker    Types: Cigarettes    Last attempt to quit: 08/10/2002    Years since quitting: 14.5  . Smokeless tobacco: Never Used  . Tobacco comment: History of tobacco abuse  Substance and Sexual Activity  . Alcohol use: Yes    Alcohol/week: 0.0 oz    Comment: rare  . Drug use: No  . Sexual activity: Yes    Birth control/protection: Post-menopausal  Other Topics Concern  . Not on file  Social History Narrative   Lives in [place] with husband   One daughter     BP 120/68   Pulse 84   Ht 5' 3.5" (1.613 m)   Wt 200 lb (90.7 kg)   SpO2 98%   BMI 34.87 kg/m   Physical Exam:  Well appearing 77 year old woman, NAD HEENT: Unremarkable Neck:  6 cm JVD, no thyromegally Lymphatics:  No adenopathy Back:  No CVA tenderness Lungs:  Clear, with no wheezes, rales, or  rhonchi. HEART:  Regular rate rhythm, no murmurs, no rubs, no clicks Abd:  soft, positive bowel sounds, no organomegally, no rebound, no guarding Ext:  2 plus pulses, no edema, no cyanosis, no clubbing Skin:  No rashes no nodules Neuro:  CN II through XII intact, motor grossly intact  EKG - reviewed-  Atrial fibrillation with a controlled ventricular response    Assess/Plan: 1. Atrial fibrillation - her rate appears to be well-controlled and she appears to be asymptomatic. She will continue her current medical therapy. 2. Chronic systolic and diastolic heart failure - her symptoms are class II. She is encouraged to maintain a low-sodium diet. She will continue her current medical therapy. 3. Abdominal aortic aneurysm status post stent grafting - she has no abdominal pain at the present time. She will undergo watchful waiting. 4. Pulmonary fibrosis - she has no symptoms of active disease. She denies cough or hemoptysis. She has class II dyspnea which is stable.  Cristopher Peru, M.D.

## 2017-02-11 NOTE — Patient Instructions (Signed)

## 2017-02-16 ENCOUNTER — Ambulatory Visit (INDEPENDENT_AMBULATORY_CARE_PROVIDER_SITE_OTHER): Payer: Medicare Other | Admitting: Adult Health

## 2017-02-16 ENCOUNTER — Encounter: Payer: Self-pay | Admitting: Adult Health

## 2017-02-16 VITALS — BP 132/74 | HR 92 | Temp 97.5°F

## 2017-02-16 DIAGNOSIS — Z79899 Other long term (current) drug therapy: Secondary | ICD-10-CM | POA: Diagnosis not present

## 2017-02-16 DIAGNOSIS — J441 Chronic obstructive pulmonary disease with (acute) exacerbation: Secondary | ICD-10-CM

## 2017-02-16 MED ORDER — AZITHROMYCIN 250 MG PO TABS: ORAL_TABLET | ORAL | 1 refills | Status: AC

## 2017-02-16 MED ORDER — DEXAMETHASONE 0.5 MG PO TABS: ORAL_TABLET | ORAL | 0 refills | Status: DC

## 2017-02-16 NOTE — Progress Notes (Signed)
Assessment and Plan:  Katie [name] was seen today for breathing problem.  Diagnoses and all orders for this visit:  COPD with acute exacerbation (HCC) -     dexamethasone (DECADRON) 0.5 MG tablet; Take 1 tablet PO TID for 3 days, then take 1 tablet PO BID for 3 days, then take 1 tablet PO for 5 days. -     azithromycin (ZITHROMAX) 250 MG tablet; Take 2 tablets (500 mg) on  Day 1,  followed by 1 tablet (250 mg) once daily on Days 2 through 5. - instructed to cut digoxin dose in half while taking azithromycin, then resume previous dose and follow up as scheduled in 3 weeks or sooner as needed.   Medication management -     Digoxin level -     BASIC METABOLIC PANEL WITH GFR  Go to the ER if any chest pain, shortness of breath, nausea, dizziness, severe HA, changes vision/speech  Further disposition pending results of labs. Discussed med's effects and SE's.   Over 20 minutes of exam, counseling, chart review, and critical decision making was performed.   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 She is prescribed trellegy ellipta daily and nebulized pulmicort as needed and albuterol rescue inhaler. She has used nebs twice which do help some but continues to have SOB. She reports she was recently around paint fumes in her family member's  house and feels that this may have triggered/contributed to symptoms.   She is currently off of lasix due to acute increase in creatinine noted on labs at regular visit last week. She does endorse some mild increase in baseline lower extremity edema, denies notable orthopnea.    Past Medical History:  Diagnosis Date  . Atrial fibrillation ([place])   . Migraine headache   . Osteopenia      Allergies  Allergen Reactions  . Amiodarone Other (See Comments)    PULMONARY TOXICITY  . Diovan [Valsartan] Other (See Comments)    HYPOTENSION  . Doxycycline Diarrhea and Other (See Comments)    VISUAL DISTURBANCE  . Flexeril [Cyclobenzaprine] Other (See Comments)    FATIGUE  . Keflex [Cephalexin] Diarrhea  . Verapamil Other (See Comments)    EDEMA  . Codeine Other (See Comments)    unknown    Current Outpatient Medications on File Prior to Visit  Medication Sig  . acetaminophen (TYLENOL) 500 MG tablet Take 1,000 mg by mouth 2 (two) times daily as needed for moderate pain.  Marland Kitchen albuterol (PROVENTIL HFA;VENTOLIN HFA) 108 (90 Base) MCG/ACT inhaler Inhale 2 puffs into the lungs every 4 (four) hours as needed for shortness of breath. Inhale 2 puffs 5-10 min apart every 4 to 6 hrs- to rescue asthma  . allopurinol (ZYLOPRIM) 300 MG tablet Take 0.5 tablets (150 mg total) by mouth daily.  . budesonide (PULMICORT) 0.25 MG/2ML nebulizer solution Take 2  mLs (0.25 mg total) by nebulization 2 (two) times daily.  . cetirizine (ZYRTEC) 10 MG tablet TAKE 1 CAPSULE (10 MG TOTAL) BY MOUTH AT BEDTIME.  Marland Kitchen Cholecalciferol (VITAMIN D3) 5000 units CAPS Takes 2 caps (10,000 units) daily  . digoxin (LANOXIN) 0.25 MG tablet Take 0.125 mg by mouth daily.  Marland Kitchen diltiazem (TIAZAC) 120 MG 24 hr capsule TAKE 1 CAPSULE BY MOUTH EVERY DAY  . Fluticasone-Umeclidin-Vilant (TRELEGY ELLIPTA) 100-62.5-25 MCG/INH AEPB Inhale 1 puff into the lungs daily.  Marland Kitchen gabapentin (NEURONTIN) 600 MG tablet Takes 1/2 to 1 tablet 3 times daily.  Marland Kitchen  KLOR-CON M20 20 MEQ tablet TAKE 2 TABLETS (40 MEQ TOTAL) BY MOUTH DAILY.  Marland Kitchen levothyroxine (SYNTHROID, LEVOTHROID) 50 MCG tablet TAKE 1 TABLET BY MOUTH EVERY DAY (Patient taking differently: Take 1.5 tablets on M,W,F and 1 tablet all other days)  . montelukast (SINGULAIR) 10 MG tablet TAKE 1 TABLET EVERY DAY FOR ALLERGIES  . Olopatadine HCl 0.2 % SOLN Place 1 drop into both eyes daily.  . pantoprazole (PROTONIX) 40 MG tablet Take 1 tablet (40 mg total) by mouth daily.  . pravastatin (PRAVACHOL) 40 MG tablet TAKE 1 TABLET BY MOUTH AT BEDTIME FOR CHOLESTEROL  . prednisoLONE acetate (PRED FORTE) 1 % ophthalmic suspension INSTILL 1 DROP INTO BOTH EYES TWICE A DAY  . prochlorperazine (COMPAZINE) 5 MG tablet TAKE 1 TABLET BY MOUTH THREE TIMES A DAY AS NEEDED FOR VERTIGO OR NAUSEA  . triamcinolone cream (KENALOG) 0.1 % Apply 1 application topically 2 (two) times daily. Apply to rash  2 x / day  . warfarin (COUMADIN) 3 MG tablet Take 3 mg by mouth daily.  . furosemide (LASIX) 80 MG tablet TAKE 1 TABLET BY MOUTH TWICE A DAY AS NEEDED FOR FLUID (Patient not taking: Reported on 02/16/2017)   No current facility-administered medications on file prior to visit.     ROS: Review of Systems  Constitutional: Negative for chills, diaphoresis, fever and malaise/fatigue.  HENT: Positive for congestion. Negative for ear discharge, ear pain, hearing loss, sinus pain, sore throat and tinnitus.   Eyes: Negative for blurred vision, pain, discharge and redness.  Respiratory: Positive for cough, sputum production and shortness of breath. Negative for hemoptysis, wheezing and stridor.   Cardiovascular: Positive for leg swelling. Negative for chest pain, palpitations and orthopnea.  Gastrointestinal: Negative for abdominal pain, diarrhea, nausea and vomiting.  Genitourinary: Negative.   Musculoskeletal: Negative for joint pain and myalgias.  Skin: Negative for rash.  Neurological: Negative for dizziness, sensory change,  weakness and headaches.  Endo/Heme/Allergies: Negative for environmental allergies.  Psychiatric/Behavioral: Negative.   All other systems reviewed and are negative.   Physical Exam:  BP 132/74   Pulse 92   Temp (!) 97.5 F (36.4 C)   SpO2 97%   General Appearance: Well nourished, in no apparent distress. Eyes: PERRLA, EOMs, conjunctiva no swelling or erythema Sinuses: No Frontal/maxillary tenderness ENT/Mouth: Ext aud canals clear, TMs without erythema, bulging. No erythema, swelling, or exudate on post pharynx.  Tonsils not swollen or erythematous. Hearing normal.  Neck: Supple, thyroid normal.  Respiratory: Respiratory effort somewhat increased, BS present through with coarse breath sounds over broncioes with coarse crackles in bilateral bases without wheezing or stridor.  Cardio: Rate irregular with audible S1/S2 with 3/6 blowing systolic murmur which patient has at baseline. 1+ symmetrical peripheral pulses with 2+ pitting edema bilateral lower legs/ankles.  Abdomen: Soft, + BS.  Non tender, no guarding, rebound, hernias, masses. Lymphatics: Non tender without lymphadenopathy.  Musculoskeletal: Full ROM, 5/5 strength, normal gait.  Skin: Warm, dry without rashes, lesions, ecchymosis.  Neuro: Cranial nerves intact. Normal muscle tone, no cerebellar symptoms. Sensation intact.  Psych: Awake and oriented X 3, normal affect, Insight and Judgment appropriate.     Katie Ribas, NP 11:53 AM [facility]

## 2017-02-16 NOTE — Patient Instructions (Addendum)
Take 1/2 tab of coumadin until done with azithromycin - then resume previous dose.     HOME CARE INSTRUCTIONS   Do not stand or sit in one position for long periods of time. Do not sit with your legs crossed. Rest with your legs raised during the day.  Your legs have to be higher than your heart so that gravity will force the valves to open, so please really elevate your legs.   Wear elastic stockings or support hose. Do not wear other tight, encircling garments around the legs, pelvis, or waist.  [employer]  has a wide variety of well priced compression stockings. [place], [place] [id number] [id number] [place] has a good cheap selection, I like the socks, they are not as hard to get on  Walk as much as possible to increase blood flow.  Raise the foot of your bed at night with 2-inch blocks. SEEK MEDICAL CARE IF:   The skin around your ankle starts to break down.  You have pain, redness, tenderness, or hard swelling developing in your leg over a vein.  You are uncomfortable due to leg pain. Document Released: 12/09/2004 Document Revised: 05/24/2011 Document Reviewed: 04/27/2010 [facility] Patient Information 2014 [place].    Edema Edema is an abnormal buildup of fluids in your bodytissues. Edema is somewhatdependent on gravity to pull the fluid to the lowest place in your body. That makes the condition more common in the legs and thighs (lower extremities). Painless swelling of the feet and ankles is common and becomes more likely as you get older. It is also common in looser tissues, like around your eyes. When the affected area is squeezed, the fluid may move out of that spot and leave a dent for a few moments. This dent is called pitting. What are the causes? There are many possible causes of edema. Eating too much salt and being on your feet or sitting for a long time can cause edema in your legs and ankles. Hot weather may make edema worse.  Common medical causes of edema include:  Heart failure.  Liver disease.  Kidney disease.  Weak blood vessels in your legs.  Cancer.  An injury.  Pregnancy.  Some medications.  Obesity.  What are the signs or symptoms? Edema is usually painless.Your skin may look swollen or shiny. How is this diagnosed? Your health care provider may be able to diagnose edema by asking about your medical history and doing a physical exam. You may need to have tests such as X-rays, an electrocardiogram, or blood tests to check for medical conditions that may cause edema. How is this treated? Edema treatment depends on the cause. If you have heart, liver, or kidney disease, you need the treatment appropriate for these conditions. General treatment may include:  Elevation of the affected body part above the level of your heart.  Compression of the affected body part. Pressure from elastic bandages or support stockings squeezes the tissues and forces fluid back into the blood vessels. This keeps fluid from entering the tissues.  Restriction of fluid and salt intake.  Use of a water pill (diuretic). These medications are appropriate only for some types of edema. They pull fluid out of your body and make you urinate more often. This gets rid of fluid and reduces swelling, but diuretics can have side effects. Only use diuretics as directed by your health care provider.  Follow these instructions at home:  Keep the affected  body part above the level of your heart when you are lying down.  Do not sit still or stand for prolonged periods.  Do not put anything directly under your knees when lying down.  Do not wear constricting clothing or garters on your upper legs.  Exercise your legs to work the fluid back into your blood vessels. This may help the swelling go down.  Wear elastic bandages or support stockings to reduce ankle swelling as directed by your health care provider.  Eat a low-salt  diet to reduce fluid if your health care provider recommends it.  Only take medicines as directed by your health care provider. Contact a health care provider if:  Your edema is not responding to treatment.  You have heart, liver, or kidney disease and notice symptoms of edema.  You have edema in your legs that does not improve after elevating them.  You have sudden and unexplained weight gain. Get help right away if:  You develop shortness of breath or chest pain.  You cannot breathe when you lie down.  You develop pain, redness, or warmth in the swollen areas.  You have heart, liver, or kidney disease and suddenly get edema.  You have a fever and your symptoms suddenly get worse. This information is not intended to replace advice given to you by your health care provider. Make sure you discuss any questions you have with your health care provider. Document Released: 03/01/2005 Document Revised: 08/07/2015 Document Reviewed: 12/22/2012 Elsevier Interactive Patient Education  2017 [employer].

## 2017-02-17 LAB — BASIC METABOLIC PANEL WITH GFR
BUN/Creatinine Ratio: 10 (calc) (ref 6–22)
BUN: 11 mg/dL (ref 7–25)
CHLORIDE: 101 mmol/L (ref 98–110)
CO2: 25 mmol/L (ref 20–32)
Calcium: 9.6 mg/dL (ref 8.6–10.4)
Creat: 1.07 mg/dL — ABNORMAL HIGH (ref 0.60–0.93)
GFR, EST AFRICAN AMERICAN: 59 mL/min/{1.73_m2} — AB (ref >=60)
GFR, Est Non African American: 51 mL/min/{1.73_m2} — ABNORMAL LOW (ref >=60)
Glucose, Bld: 82 mg/dL (ref 65–99)
Potassium: 4.1 mmol/L (ref 3.5–5.3)
SODIUM: 137 mmol/L (ref 135–146)

## 2017-02-17 LAB — DIGOXIN LEVEL: Digoxin Level: 1 mcg/L (ref 0.8–2.0)

## 2017-02-21 ENCOUNTER — Other Ambulatory Visit: Payer: Self-pay | Admitting: Internal Medicine

## 2017-02-23 ENCOUNTER — Other Ambulatory Visit: Payer: Self-pay | Admitting: Internal Medicine

## 2017-02-23 MED ORDER — PROMETHAZINE-DM 6.25-15 MG/5ML PO SYRP: ORAL_SOLUTION | ORAL | 0 refills | Status: DC

## 2017-02-23 MED ORDER — BENZONATATE 200 MG PO CAPS: ORAL_CAPSULE | ORAL | 0 refills | Status: DC

## 2017-03-10 ENCOUNTER — Ambulatory Visit: Payer: Self-pay | Admitting: Physician Assistant

## 2017-03-11 ENCOUNTER — Ambulatory Visit (INDEPENDENT_AMBULATORY_CARE_PROVIDER_SITE_OTHER): Payer: Medicare Other | Admitting: Adult Health

## 2017-03-11 ENCOUNTER — Encounter: Payer: Self-pay | Admitting: Adult Health

## 2017-03-11 VITALS — BP 110/64 | HR 73 | Temp 98.2°F | Wt 200.0 lb

## 2017-03-11 DIAGNOSIS — J441 Chronic obstructive pulmonary disease with (acute) exacerbation: Secondary | ICD-10-CM | POA: Diagnosis not present

## 2017-03-11 DIAGNOSIS — N183 Chronic kidney disease, stage 3 unspecified: Secondary | ICD-10-CM

## 2017-03-11 DIAGNOSIS — Z79899 Other long term (current) drug therapy: Secondary | ICD-10-CM

## 2017-03-11 LAB — BASIC METABOLIC PANEL WITH GFR
BUN/Creatinine Ratio: 14 (calc) (ref 6–22)
BUN: 18 mg/dL (ref 7–25)
CO2: 27 mmol/L (ref 20–32)
CREATININE: 1.29 mg/dL — AB (ref 0.60–0.93)
Calcium: 9.7 mg/dL (ref 8.6–10.4)
Chloride: 101 mmol/L (ref 98–110)
GFR, EST AFRICAN AMERICAN: 47 mL/min/{1.73_m2} — AB (ref >=60)
GFR, EST NON AFRICAN AMERICAN: 40 mL/min/{1.73_m2} — AB (ref >=60)
Glucose, Bld: 94 mg/dL (ref 65–99)
POTASSIUM: 4.1 mmol/L (ref 3.5–5.3)
SODIUM: 139 mmol/L (ref 135–146)

## 2017-03-11 LAB — CBC WITH DIFFERENTIAL/PLATELET
BASOS ABS: 54 {cells}/uL (ref 0–200)
Basophils Relative: 0.7 %
EOS ABS: 262 {cells}/uL (ref 15–500)
Eosinophils Relative: 3.4 %
HEMATOCRIT: 47.9 % — AB (ref 35.0–45.0)
HEMOGLOBIN: 16.2 g/dL — AB (ref 11.7–15.5)
LYMPHS ABS: 1686 {cells}/uL (ref 850–3900)
MCH: 29.1 pg (ref 27.0–33.0)
MCHC: 33.8 g/dL (ref 32.0–36.0)
MCV: 86 fL (ref 80.0–100.0)
MPV: 10.9 fL (ref 7.5–12.5)
Monocytes Relative: 9 %
NEUTROS ABS: 5005 {cells}/uL (ref 1500–7800)
NEUTROS PCT: 65 %
Platelets: 215 10*3/uL (ref 140–400)
RBC: 5.57 10*6/uL — ABNORMAL HIGH (ref 3.80–5.10)
RDW: 14.3 % (ref 11.0–15.0)
Total Lymphocyte: 21.9 %
WBC: 7.7 10*3/uL (ref 3.8–10.8)
WBCMIX: 693 {cells}/uL (ref 200–950)

## 2017-03-11 MED ORDER — DEXAMETHASONE 0.5 MG PO TABS: ORAL_TABLET | ORAL | 0 refills | Status: DC

## 2017-03-11 MED ORDER — LEVOFLOXACIN 750 MG PO TABS
750.0000 mg | ORAL_TABLET | Freq: Every day | ORAL | 0 refills | Status: AC
Start: 2017-03-11 — End: 2017-03-16

## 2017-03-11 NOTE — Progress Notes (Signed)
Assessment and Plan:  Nysia was seen today for uri.  Diagnoses and all orders for this visit:  COPD exacerbation ([place]) Continue respiratory medications at home, continue guaifenesin, nyquil for cough, daily allergy medication (advised considering rotating agent for improved efficacy) Declines CXR today -     dexamethasone (DECADRON) 0.5 MG tablet; Take 1 tablet PO TID for 3 days, then take 1 tablet PO BID for 3 days, then take 1 tablet PO for 5 days. -     levofloxacin (LEVAQUIN) 750 MG tablet; Take 1 tablet (750 mg total) by mouth daily for 5 days.       - will follow up as scheduled early January  Medication management -     CBC with Differential/Platelet -     BASIC METABOLIC PANEL WITH GFR  CKD  stage III (GFR 51 ml/min) She has restarted lasix after recheck with improved BMP at follow up - will recheck today for progress -     BASIC METABOLIC PANEL WITH GFR  Go to the ER if any chest pain, shortness of breath, nausea, dizziness, severe HA, changes vision/speech  Further disposition pending results of labs. Discussed med's effects and SE's.   Over 20 minutes of exam, counseling, chart review, and critical decision making was performed.   Future Appointments  Date Time Provider [place]  03/21/2017 10:30 AM [name], MD [facility] None  04/26/2017  8:00 AM [facility] 4 [facility]  04/26/2017  9:00 AM [facility] 4 [facility]  04/26/2017  9:45 AM Nickel, Sharmon Leyden, NP [facility]  02/02/2018  9:00 AM [name], MD [facility] None    ------------------------------------------------------------------------------------------------------------------   HPI BP 110/64   Pulse 73   Temp 98.2 F (36.8 C)   Wt 200 lb (90.7 kg)   SpO2 97%   BMI 34.87 kg/m   76 y.o.female with hx of COPD, a. Fib treated by  presents for ongoing chest congestion, coughing that is worse at night - she reports she is having difficulty getting up very thick yellow  secretions.   She has been using guaifenesin daily for 1.5 weeks, she has been prescribed zpac and decadron taper early in course which she reports improved symptoms for a while; she had an old prescription of zpac at home to she reports she completed a second course but continues to have productive cough. She has been taking nyquil for cough and reports this has helped. She has also been prescribed promethazine cough syrup and benzonatate - she did not fill the latter due to cost concerns. She does take daily zyrtec.   She does endorse increased dyspnea after a coughing spell but this is fully resolved by 1-2 nebulized albuterol treatments daily. She continues to use daily fluticasone-umeclidin-vilant inhaler. She denies wheezing or SOB at rest or with activity.   She is a former smoker; her immunizations are up to date.   Past Medical History:  Diagnosis Date  . Atrial fibrillation ([place])   . Migraine headache   . Osteopenia      Allergies  Allergen Reactions  . Amiodarone Other (See Comments)    PULMONARY TOXICITY  . Diovan [Valsartan] Other (See Comments)    HYPOTENSION  . Doxycycline Diarrhea and Other (See Comments)    VISUAL DISTURBANCE  . Flexeril [Cyclobenzaprine] Other (See Comments)    FATIGUE  . Keflex [Cephalexin] Diarrhea  . Verapamil Other (See Comments)    EDEMA  . Codeine Other (See Comments)    unknown  Current Outpatient Medications on File Prior to Visit  Medication Sig  . POTASSIUM CHLORIDE PO Take by mouth. Taking 1 tablet by mouth daily  . acetaminophen (TYLENOL) 500 MG tablet Take 1,000 mg by mouth 2 (two) times daily as needed for moderate pain.  Marland Kitchen albuterol (PROVENTIL HFA;VENTOLIN HFA) 108 (90 Base) MCG/ACT inhaler Inhale 2 puffs into the lungs every 4 (four) hours as needed for shortness of breath. Inhale 2 puffs 5-10 min apart every 4 to 6 hrs- to rescue asthma  . allopurinol (ZYLOPRIM) 300 MG tablet Take 0.5 tablets (150 mg total) by mouth daily.  (Patient not taking: Reported on 03/11/2017)  . benzonatate (TESSALON) 200 MG capsule Take 1 perle 3 x / day to prevent cough (Patient not taking: Reported on 03/11/2017)  . budesonide (PULMICORT) 0.25 MG/2ML nebulizer solution Take 2 mLs (0.25 mg total) by nebulization 2 (two) times daily.  . cetirizine (ZYRTEC) 10 MG tablet TAKE 1 CAPSULE (10 MG TOTAL) BY MOUTH AT BEDTIME.  Marland Kitchen Cholecalciferol (VITAMIN D3) 5000 units CAPS Takes 2 caps (10,000 units) daily  . dexamethasone (DECADRON) 0.5 MG tablet Take 1 tablet PO TID for 3 days, then take 1 tablet PO BID for 3 days, then take 1 tablet PO for 5 days.  . digoxin (LANOXIN) 0.25 MG tablet Take 0.125 mg by mouth daily.  Marland Kitchen diltiazem (TIAZAC) 120 MG 24 hr capsule TAKE 1 CAPSULE BY MOUTH EVERY DAY  . Fluticasone-Umeclidin-Vilant (TRELEGY ELLIPTA) 100-62.5-25 MCG/INH AEPB Inhale 1 puff into the lungs daily.  . furosemide (LASIX) 80 MG tablet TAKE 1 TABLET BY MOUTH TWICE A DAY AS NEEDED FOR FLUID (Patient not taking: Reported on 02/16/2017)  . gabapentin (NEURONTIN) 600 MG tablet Takes 1/2 to 1 tablet 3 times daily.  Marland Kitchen KLOR-CON M20 20 MEQ tablet TAKE 2 TABLETS (40 MEQ TOTAL) BY MOUTH DAILY.  Marland Kitchen levothyroxine (SYNTHROID, LEVOTHROID) 50 MCG tablet TAKE 1 TABLET BY MOUTH EVERY DAY (Patient taking differently: Take 1.5 tablets on M,W,F and 1 tablet all other days)  . montelukast (SINGULAIR) 10 MG tablet TAKE 1 TABLET EVERY DAY FOR ALLERGIES  . Olopatadine HCl 0.2 % SOLN Place 1 drop into both eyes daily.  . pantoprazole (PROTONIX) 40 MG tablet Take 1 tablet (40 mg total) by mouth daily.  . pravastatin (PRAVACHOL) 40 MG tablet TAKE 1 TABLET BY MOUTH AT BEDTIME FOR CHOLESTEROL  . prednisoLONE acetate (PRED FORTE) 1 % ophthalmic suspension INSTILL 1 DROP INTO BOTH EYES TWICE A DAY  . prochlorperazine (COMPAZINE) 5 MG tablet TAKE 1 TABLET BY MOUTH THREE TIMES A DAY AS NEEDED FOR VERTIGO OR NAUSEA  . promethazine-dextromethorphan (PROMETHAZINE-DM) 6.25-15 MG/5ML syrup  Take 1 to 2 tsp enery 4 hours if needed for cough (Patient not taking: Reported on 03/11/2017)  . triamcinolone cream (KENALOG) 0.1 % Apply 1 application topically 2 (two) times daily. Apply to rash  2 x / day  . warfarin (COUMADIN) 3 MG tablet Take 3 mg by mouth daily.   No current facility-administered medications on file prior to visit.     ROS: Review of Systems  Constitutional: Negative for chills, diaphoresis, fever and malaise/fatigue.  HENT: Positive for congestion and sore throat. Negative for ear discharge, ear pain, hearing loss, sinus pain and tinnitus.   Eyes: Negative for blurred vision, pain, discharge and redness.  Respiratory: Positive for cough, sputum production and shortness of breath (With severe coughing). Negative for hemoptysis, wheezing and stridor.   Cardiovascular: Positive for leg swelling (Improved from baseline with lasix and  compression hose). Negative for chest pain, palpitations, orthopnea, claudication and PND.  Gastrointestinal: Negative for abdominal pain, diarrhea, nausea and vomiting.  Genitourinary: Negative.        Incontinence with coughing  Musculoskeletal: Negative for joint pain and myalgias.  Skin: Negative for rash.  Neurological: Negative for dizziness, sensory change, weakness and headaches.  Endo/Heme/Allergies: Negative for environmental allergies.  Psychiatric/Behavioral: Negative.   All other systems reviewed and are negative.   Physical Exam:  BP 110/64   Pulse 73   Temp 98.2 F (36.8 C)   Wt 200 lb (90.7 kg)   SpO2 97%   BMI 34.87 kg/m   General Appearance: Well nourished, in no acute distress. Eyes: PERRLA, EOMs, conjunctiva no swelling or erythema Sinuses: No Frontal/maxillary tenderness ENT/Mouth: Ext aud canals clear, TMs without erythema, bulging. No erythema, swelling, or exudate on post pharynx.  Tonsils not swollen or erythematous. Hearing normal.  Neck: Supple, thyroid normal.  Respiratory: Respiratory effort  somewhat increased, intermittent pursed lip breathing, BS present through bilaterally with rales over bronchioles, without rhonchi, wheezing or stridor.  Cardio: RRR with no MRGs. Brisk peripheral pulses without edema.  Abdomen: Soft, + BS.  Non tender, no guarding, rebound, hernias, masses. Lymphatics: Non tender without lymphadenopathy.  Musculoskeletal: Full ROM, 5/5 strength, normal gait.  Skin: Warm, dry without rashes, lesions, ecchymosis.  Neuro: Cranial nerves intact. Normal muscle tone, no cerebellar symptoms. Sensation intact.  Psych: Awake and oriented X 3, normal affect, Insight and Judgment appropriate.     Izora Ribas, NP 11:37 AM [facility]

## 2017-03-11 NOTE — Patient Instructions (Signed)
We will give you samples for myrbetriq for urinary incontinence - we may also want to perform a pelvic exam at some point and rule out a cystocele (weakness in vaginal wall that causes bladder to bulge and shift)   Urinary Incontinence Urinary incontinence is the involuntary loss of urine from your bladder. What are the causes? There are many causes of urinary incontinence. They include:  Medicines.  Infections.  Prostatic enlargement, leading to overflow of urine from your bladder.  Surgery.  Neurological diseases.  Emotional factors.  What are the signs or symptoms? Urinary Incontinence can be divided into four types: 1. Urge incontinence. Urge incontinence is the involuntary loss of urine before you have the opportunity to go to the bathroom. There is a sudden urge to void but not enough time to reach a bathroom. 2. Stress incontinence. Stress incontinence is the sudden loss of urine with any activity that forces urine to pass. It is commonly caused by anatomical changes to the pelvis and sphincter areas of your body. 3. Overflow incontinence. Overflow incontinence is the loss of urine from an obstructed opening to your bladder. This results in a backup of urine and a resultant buildup of pressure within the bladder. When the pressure within the bladder exceeds the closing pressure of the sphincter, the urine overflows, which causes incontinence, similar to water overflowing a dam. 4. Total incontinence. Total incontinence is the loss of urine as a result of the inability to store urine within your bladder.  How is this diagnosed? Evaluating the cause of incontinence may require:  A thorough and complete medical and obstetric history.  A complete physical exam.  Laboratory tests such as a urine culture and sensitivities.  When additional tests are indicated, they can include:  An ultrasound exam.  Kidney and bladder X-rays.  Cystoscopy. This is an exam of the bladder  using a narrow scope.  Urodynamic testing to test the nerve function to the bladder and sphincter areas.  How is this treated? Treatment for urinary incontinence depends on the cause:  For urge incontinence caused by a bacterial infection, antibiotics will be prescribed. If the urge incontinence is related to medicines you take, your health care provider may have you change the medicine.  For stress incontinence, surgery to re-establish anatomical support to the bladder or sphincter, or both, will often correct the condition.  For overflow incontinence caused by an enlarged prostate, an operation to open the channel through the enlarged prostate will allow the flow of urine out of the bladder. In women with fibroids, a hysterectomy may be recommended.  For total incontinence, surgery on your urinary sphincter may help. An artificial urinary sphincter (an inflatable cuff placed around the urethra) may be required. In women who have developed a hole-like passage between their bladder and vagina (vesicovaginal fistula), surgery to close the fistula often is required.  Follow these instructions at home:  Normal daily hygiene and the use of pads or adult diapers that are changed regularly will help prevent odors and skin damage.  Avoid caffeine. It can overstimulate your bladder.  Use the bathroom regularly. Try about every 2-3 hours to go to the bathroom, even if you do not feel the need to do so. Take time to empty your bladder completely. After urinating, wait a minute. Then try to urinate again.  For causes involving nerve dysfunction, keep a log of the medicines you take and a journal of the times you go to the bathroom. Contact a health care  provider if:  You experience worsening of pain instead of improvement in pain after your procedure.  Your incontinence becomes worse instead of better. Get help right away if:  You experience fever or shaking chills.  You are unable to pass your  urine.  You have redness spreading into your groin or down into your thighs. This information is not intended to replace advice given to you by your health care provider. Make sure you discuss any questions you have with your health care provider. Document Released: 04/08/2004 Document Revised: 10/10/2015 Document Reviewed: 08/08/2012 Elsevier Interactive Patient Education  [employer].

## 2017-03-20 DIAGNOSIS — M109 Gout, unspecified: Secondary | ICD-10-CM | POA: Insufficient documentation

## 2017-03-20 NOTE — Patient Instructions (Signed)
Bleeding Precautions When on Anticoagulant Therapy  WHAT IS ANTICOAGULANT THERAPY?  Anticoagulant therapy is taking medicine to prevent or reduce blood clots. It is also called blood thinner therapy. Blood clots that form in your blood vessels can be dangerous. They can break loose and travel to your heart, lungs, or brain. This increases your risk of a heart attack or stroke. Anticoagulant therapy causes blood to clot more slowly.  You may need anticoagulant therapy if you have:   A medical condition that increases the likelihood that blood clots will form.   A heart defect or a problem with heart rhythm.  It is also a common treatment after heart surgery, such as valve replacement.  WHAT ARE COMMON TYPES OF ANTICOAGULANT THERAPY?  Anticoagulant medicine can be injected or taken by mouth.If you need anticoagulant therapy quickly at the hospital, the medicine may be injected under your skin or given through an IV tube. Heparin is a common example of an anticoagulant that you may get at the hospital.  Most anticoagulant therapy is in the form of pills that you take at home every day. These may include:   Aspirin. This common blood thinner works by preventing blood cells (platelets) from sticking together to form a clot. Aspirin is not as strong as anticoagulants that slow down the time that it takes for your body to form a clot.   Clopidogrel. This is a newer type of drug that affects platelets. It is stronger than aspirin.   Warfarin. This is the most common anticoagulant. It changes the way your body uses vitamin K, a vitamin that helps your blood to clot. The risk of bleeding is higher with warfarin than with aspirin. You will need frequent blood tests to make sure you are taking the safest amount.   New anticoagulants. Several new drugs have been approved. They are all taken by mouth. Studies show that these drugs work as well as warfarin. They do not require blood testing. They may cause less bleeding  risk than warfarin.  WHAT DO I NEED TO REMEMBER WHEN TAKING ANTICOAGULANT THERAPY?  Anticoagulant therapy decreases your risk of forming a blood clot, but it increases your risk of bleeding. Work closely with your health care provider to make sure you are taking your medicine safely. These tips can help:   Learn ways to reduce your risk of bleeding.   If you are taking warfarin:  ? Have blood tests as ordered by your health care provider.  ? Do not make any sudden changes to your diet. Vitamin K in your diet can make warfarin less effective.  ? Do not get pregnant. This medicine may cause birth defects.   Take your medicine at the same time every day. If you forget to take your medicine, take it as soon as you remember. If you miss a whole day, do not double your dose of medicine. Take your normal dose and call your health care provider to check in.   Do not stop taking your medicine on your own.   Tell your health care provider before you start taking any new medicine, vitamin, or herbal product. Some of these could interfere with your therapy.   Tell all of your health care providers that you are on anticoagulant therapy.   Do not have surgery, medical procedures, or dental work until you tell your health care provider that you are on anticoagulant therapy.  WHAT CAN AFFECT HOW ANTICOAGULANTS WORK?  Certain foods, vitamins, medicines, supplements, and herbal   medicines change the way that anticoagulant therapy works. They may increase or decrease the effects of your anticoagulant therapy. Either result can be dangerous for you.   Many over-the-counter medicines for pain, colds, or stomach problems interfere with anticoagulant therapy. Take these only as told by your health care provider.   Do not drink alcohol. It can interfere with your medicine and increase your risk of an injury that causes bleeding.   If you are taking warfarin, do not begin eating more foods that contain vitamin K. These include  leafy green vegetables. Ask your health care provider if you should avoid any foods.  WHAT ARE SOME WAYS TO PREVENT BLEEDING?  You can prevent bleeding by taking certain precautions:   Be extra careful when you use knives, scissors, or other sharp objects.   Use an electric razor instead of a blade.   Do not use toothpicks.   Use a soft toothbrush.   Wear shoes that have nonskid soles.   Use bath mats and handrails in your bathroom.   Wear gloves while you do yard work.   Wear a helmet when you ride a bike.   Wear your seat belt.   Prevent falls by removing loose rugs and extension cords from areas where you walk.   Do not play contact sports or participate in other activities that have a high risk of injury.  WHEN SHOULD I CONTACT MY HEALTH CARE PROVIDER?  Call your health care provider if:   You miss a dose of medicine:  ? And you are not sure what to do.  ? For more than one day.   You have:  ? Menstrual bleeding that is heavier than normal.  ? Blood in your urine.  ? A bloody nose or bleeding gums.  ? Easy bruising.  ? Blood in your stool (feces) or have black and tarry stool.  ? Side effects from your medicine.   You feel weak or dizzy.   You become pregnant.  Seek immediate medical care if:   You have bleeding that will not stop.   You have sudden and severe headache or belly pain.   You vomit or you cough up bright red blood.   You have a severe blow to your head.  WHAT ARE SOME QUESTIONS TO ASK MY HEALTH CARE PROVIDER?   What is the best anticoagulant therapy for my condition?   What side effects should I watch for?   When should I take my medicine? What should I do if I forget to take it?   Will I need to have regular blood tests?   Do I need to change my diet? Are there foods or drinks that I should avoid?   What activities are safe for me?   What should I do if I want to get pregnant?  This information is not intended to replace advice given to you by your health care provider.  Make sure you discuss any questions you have with your health care provider.  Document Released: 02/10/2015 Document Reviewed: 02/10/2015  Elsevier Interactive Patient Education  2017 [employer]

## 2017-03-20 NOTE — Progress Notes (Signed)
Subjective:    Patient ID: Katie [name], female    DOB: 09/09/1940, 77 y.o.   MRN: [id number]  HPI   Patient is seen in short 2 week f/u after Tx for AECB. Patient is a delightful 77 yo MWF on Coumadin for cAfib and also is followed for HTN, HLD, CKD3, COPD, GERD, Hypothyroidism. She reports bronchitis is better after recent Tx.   Thyroid also adjusted Dec 29 & Gout Med (Allopurinol) d/c'd.  On Dec 28  Creat increased last month 1.07 -->>1.29 & GFR Decreased 51 -->> 40 ml/min & CBC hemo concentrated Hgb 14.6 -->>16.2  Lab Results  Component Value Date   INR 1.3 (H) 02/09/2017   INR 3.6 (H) 01/06/2017   INR 3.1 (H) 11/18/2016   Medication Sig  . acetaminophen \) 500 MG tablet Take 1,000 mg 2  times daily as needed for moderate pain.  Marland Kitchen albuterol / VENTOLIN HFA  inhaler Inhale 2 puffs  every 4 ( hours as needed   . budesonide  0.25 MG/2ML neb soln Take 2 mLs  by neb 2  times daily.  . cetirizine (10 MG tablet TAKE 1 CAP AT BEDTIME.  Marland Kitchen VIT D 5000 units Takes 2 caps (10,000 units) daily  . digoxin  0.25 MG tablet Take  daily.  Marland Kitchen diltiazem  120 MG 24 hr  TAKE 1 CAP EVERY DAY  .  TRELEGY ELLIPTA 100-62.5-25 Inhale 1 puff  daily.  . furosemide (LASIX) 80 MG t TAKE 1 TAB TWICE/DAY - takes 1 x/day  . gabapentin  600 MG tablet Takes 1/2 to 1 tab 3 times daily.  Marland Kitchen KLOR-CON  20 MEQ TAKE 2 TAB DAILY.  Marland Kitchen levothyroxine 50 MCG tablet Take 1.5 tablets on M,W,F and 1 tablet all other days)  . montelukast  10 MG tablet TAKE 1 TAB EVERY DAY FOR ALLERGIES  . Olopatadine HCl 0.2 % SOLN Place 1 drop into both eyes daily.  . pantoprazole  40 MG tablet Take 1 tab daily.  . pravastatin  40 MG tablet TAKE 1 TAB AT BEDTIME   . PRED FORTE 1 % ophth susp INSTILL 1 DROP INTO  EYES TWICE A DAY  . prochlorperazine  5 MG tablet TAKE 1 TAB THREE TIMES A DAY AS NEEDED   . triamcinolone crm  0.1 % Apply 1 application topically 2 times daily  . warfarin (COUMADIN) 3 MG tablet Take daily.  Marland Kitchen allopurinol) 300 MG  tablet (Patient not taking: Reported on 03/11/2017)  . POTASSIUM CHLORIDE  Taking 1 tablet by mouth daily   Allergies  Allergen Reactions  . Amiodarone Other (See Comments)    PULMONARY TOXICITY  . Diovan [Valsartan] Other (See Comments)    HYPOTENSION  . Doxycycline Diarrhea and Other (See Comments)    VISUAL DISTURBANCE  . Flexeril [Cyclobenzaprine] Other (See Comments)    FATIGUE  . Keflex [Cephalexin] Diarrhea  . Verapamil Other (See Comments)    EDEMA  . Codeine Other (See Comments)    unknown   Past Medical History:  Diagnosis Date  . Atrial fibrillation ([place])   . Migraine headache   . Osteopenia    Review of Systems  10 point systems review negative except as above.    Objective:   Physical Exam  BP 140/82   Pulse 64   Temp (!) 97.2 F (36.2 C)   Resp 18   Ht 5' 3.5" (1.613 m)   Wt 202 lb 6.4 oz (91.8 kg)   BMI 35.29 kg/m  HEENT - WNL. Neck - supple.  Chest - BS decreased, but Clear. Cor - Sl irreg  R&R w/ a soft sys m. PP 1(+). No edema. MS- FROM w/o deformities.  Gait Nl. Neuro -  Nl w/o focal abnormalities.    Assessment & Plan:   1. Essential hypertension  - CBC with Differential/Platelet - BASIC METABOLIC PANEL WITH GFR - TSH  2. CKD  stage III (GFR 51 ml/min)  - BASIC METABOLIC PANEL WITH GFR - TSH  3. Chronic atrial fibrillation (HCC)  - Protime-INR  4. Gout  - Uric acid  5. Hypothyroidism  - TSH  6. Long term current use of anticoagulant therapy  - Protime-INR  7. Medication management  - CBC with Differential/Platelet - BASIC METABOLIC PANEL WITH GFR - TSH - Uric acid

## 2017-03-21 ENCOUNTER — Encounter: Payer: Self-pay | Admitting: Internal Medicine

## 2017-03-21 ENCOUNTER — Ambulatory Visit (INDEPENDENT_AMBULATORY_CARE_PROVIDER_SITE_OTHER): Payer: Medicare Other | Admitting: Internal Medicine

## 2017-03-21 VITALS — BP 140/82 | HR 64 | Temp 97.2°F | Resp 18 | Ht 63.5 in | Wt 202.4 lb

## 2017-03-21 DIAGNOSIS — E039 Hypothyroidism, unspecified: Secondary | ICD-10-CM

## 2017-03-21 DIAGNOSIS — N183 Chronic kidney disease, stage 3 unspecified: Secondary | ICD-10-CM

## 2017-03-21 DIAGNOSIS — I10 Essential (primary) hypertension: Secondary | ICD-10-CM | POA: Diagnosis not present

## 2017-03-21 DIAGNOSIS — Z7901 Long term (current) use of anticoagulants: Secondary | ICD-10-CM | POA: Diagnosis not present

## 2017-03-21 DIAGNOSIS — Z79899 Other long term (current) drug therapy: Secondary | ICD-10-CM

## 2017-03-21 DIAGNOSIS — I482 Chronic atrial fibrillation, unspecified: Secondary | ICD-10-CM

## 2017-03-21 DIAGNOSIS — M109 Gout, unspecified: Secondary | ICD-10-CM

## 2017-03-22 LAB — CBC WITH DIFFERENTIAL/PLATELET
BASOS PCT: 0.5 %
Basophils Absolute: 50 cells/uL (ref 0–200)
EOS ABS: 180 {cells}/uL (ref 15–500)
Eosinophils Relative: 1.8 %
HEMATOCRIT: 46.6 % — AB (ref 35.0–45.0)
HEMOGLOBIN: 15.8 g/dL — AB (ref 11.7–15.5)
LYMPHS ABS: 2730 {cells}/uL (ref 850–3900)
MCH: 29.5 pg (ref 27.0–33.0)
MCHC: 33.9 g/dL (ref 32.0–36.0)
MCV: 86.9 fL (ref 80.0–100.0)
MPV: 10.7 fL (ref 7.5–12.5)
Monocytes Relative: 10.1 %
NEUTROS ABS: 6030 {cells}/uL (ref 1500–7800)
Neutrophils Relative %: 60.3 %
Platelets: 232 10*3/uL (ref 140–400)
RBC: 5.36 10*6/uL — ABNORMAL HIGH (ref 3.80–5.10)
RDW: 15.1 % — ABNORMAL HIGH (ref 11.0–15.0)
Total Lymphocyte: 27.3 %
WBC: 10 10*3/uL (ref 3.8–10.8)
WBCMIX: 1010 {cells}/uL — AB (ref 200–950)

## 2017-03-22 LAB — BASIC METABOLIC PANEL WITH GFR
BUN/Creatinine Ratio: 20 (calc) (ref 6–22)
BUN: 24 mg/dL (ref 7–25)
CO2: 32 mmol/L (ref 20–32)
CREATININE: 1.2 mg/dL — AB (ref 0.60–0.93)
Calcium: 9.8 mg/dL (ref 8.6–10.4)
Chloride: 100 mmol/L (ref 98–110)
GFR, EST AFRICAN AMERICAN: 51 mL/min/{1.73_m2} — AB (ref >=60)
GFR, EST NON AFRICAN AMERICAN: 44 mL/min/{1.73_m2} — AB (ref >=60)
GLUCOSE: 66 mg/dL (ref 65–99)
Potassium: 4.2 mmol/L (ref 3.5–5.3)
Sodium: 141 mmol/L (ref 135–146)

## 2017-03-22 LAB — TSH: TSH: 7.18 m[IU]/L — AB (ref 0.40–4.50)

## 2017-03-22 LAB — URIC ACID: Uric Acid, Serum: 8.2 mg/dL — ABNORMAL HIGH (ref 2.5–7.0)

## 2017-03-22 LAB — PROTIME-INR
INR: 1.4 — ABNORMAL HIGH
Prothrombin Time: 15.1 s — ABNORMAL HIGH (ref 9.0–11.5)

## 2017-03-29 ENCOUNTER — Telehealth (INDEPENDENT_AMBULATORY_CARE_PROVIDER_SITE_OTHER): Payer: Self-pay | Admitting: Physician Assistant

## 2017-03-29 NOTE — Telephone Encounter (Signed)
Patient requesting a cortisone shot in her right knee for tomorrow at her appt with Artis Delay.

## 2017-03-29 NOTE — Telephone Encounter (Signed)
FYI

## 2017-03-30 ENCOUNTER — Encounter (INDEPENDENT_AMBULATORY_CARE_PROVIDER_SITE_OTHER): Payer: Self-pay | Admitting: Physician Assistant

## 2017-03-30 ENCOUNTER — Ambulatory Visit (INDEPENDENT_AMBULATORY_CARE_PROVIDER_SITE_OTHER): Payer: Medicare Other

## 2017-03-30 ENCOUNTER — Ambulatory Visit (INDEPENDENT_AMBULATORY_CARE_PROVIDER_SITE_OTHER): Payer: Medicare Other | Admitting: Physician Assistant

## 2017-03-30 DIAGNOSIS — G8929 Other chronic pain: Secondary | ICD-10-CM | POA: Diagnosis not present

## 2017-03-30 DIAGNOSIS — M25561 Pain in right knee: Secondary | ICD-10-CM | POA: Diagnosis not present

## 2017-03-30 MED ORDER — LIDOCAINE HCL 1 % IJ SOLN
3.0000 mL | INTRAMUSCULAR | Status: AC | PRN
  Administered 2017-03-30: 3 mL

## 2017-03-30 MED ORDER — METHYLPREDNISOLONE ACETATE 40 MG/ML IJ SUSP
40.0000 mg | INTRAMUSCULAR | Status: AC | PRN
  Administered 2017-03-30: 40 mg via INTRA_ARTICULAR

## 2017-03-30 NOTE — Progress Notes (Signed)
Office Visit Note   Patient: Katie [name]           Date of Birth: Feb 18, 1941           MRN: [id number] Visit Date: 03/30/2017              Requested by: [name], [address], [place] [address] PCP: [name], MD   Assessment & Plan: Visit Diagnoses:  1. Chronic pain of right knee     Plan: She is unable to take NSAIDs due to her coronary artery disease and due to her renal disease.  Therefore she will take Tylenol for pain.  She will follow-up with [place] on a as needed basis for cortisone injections as often as every 3 months.  Follow-Up Instructions: No Follow-up on file.   Orders:  Orders Placed This Encounter  Procedures  . Large Joint Inj  . XR KNEE 3 VIEW RIGHT   No orders of the defined types were placed in this encounter.     Procedures: Large Joint Inj: R knee on 03/30/2017 5:32 PM Indications: pain Details: 22 G 1.5 in needle, anterolateral approach  Arthrogram: No  Medications: 3 mL lidocaine 1 %; 40 mg methylPREDNISolone acetate 40 MG/ML Outcome: tolerated well, no immediate complications Procedure, treatment alternatives, risks and benefits explained, specific risks discussed. Consent was given by the patient. Immediately prior to procedure a time out was called to verify the correct patient, procedure, equipment, support staff and site/side marked as required. Patient was prepped and draped in the usual sterile fashion.       Clinical Data: No additional findings.   Subjective: Chief Complaint  Patient presents with  . Right Knee - Pain    HPI Katie [name] returns today due to right knee pain.  She was last seen on 04/21/2016 for her right knee and was given a cortisone injection.  She said no new injury to the knee.  She is having pain mainly along the medial aspect of the knee.  Denies any mechanical symptoms.  Review of Systems No fevers chills shortness of breath chest pain  Objective: Vital Signs:  There were no vitals taken for this visit.  Physical Exam  Constitutional: She is oriented to person, place, and time. She appears well-developed and well-nourished. No distress.  Pulmonary/Chest: Effort normal.  Neurological: She is alert and oriented to person, place, and time.  Skin: She is not diaphoretic.  Psychiatric: She has a normal mood and affect.    Ortho Exam Right knee tenderness medial joint line.  Crepitus with passive range of motion.  No instability valgus varus stressing.  Good range of motion of the knee otherwise.  No effusion abnormal warmth or erythema Specialty Comments:  No specialty comments available.  Imaging: Xr Knee 3 View Right  Result Date: 03/30/2017 3 views right knee: No acute findings.  Moderate medial compartmental arthritic changes mild lateral compartment changes and mild to moderate patellofemoral changes.  No subluxation dislocation of the right knee.    PMFS History: Patient Active Problem List   Diagnosis Date Noted  . Gout 03/20/2017  . Chronic systolic CHF (congestive heart failure) ([place]) 07/19/2016  . Chest pain 07/07/2016  . Hyponatremia 07/07/2016  . Hypotension 02/03/2016  . Other abnormal glucose 09/19/2015  . AAA (abdominal aortic aneurysm) without rupture ([place]) 04/22/2015  . Morbid obesity due to excess calories ([place]) 08/03/2014  . PVD (peripheral vascular disease) with claudication ([place]) 10/16/2013  . CKD  stage III (GFR 51 ml/min) 06/08/2013  . Hyperlipidemia 06/08/2013  . Medication management 06/08/2013  . 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 Long term current use of anticoagulant therapy 04/23/2013  . Vitamin D deficiency 02/15/2013  . Hypothyroidism   . Osteopenia   . Congestive heart failure ([place]) 11/25/2008  . Migraine headache 11/22/2008  . Essential hypertension 11/22/2008  . Coronary atherosclerosis 11/22/2008  . Atrial fibrillation ([place]) 11/22/2008  . COPD (chronic obstructive pulmonary  disease) with chronic bronchitis ([place]) 11/22/2008  . GERD 11/22/2008   Past Medical History:  Diagnosis Date  . Atrial fibrillation ([place])   . Migraine headache   . Osteopenia     Family History  Problem Relation Age of Onset  . Cirrhosis Mother   . Cancer Mother 45       PANCREAS  . Heart defect Sister   . Breast cancer Sister        age 50  . Heart disease Sister   . Stroke Sister   . Alcohol abuse Father   . Depression Father   . Hypertension Brother   . Hyperlipidemia Son   . Heart disease Daughter     Past Surgical History:  Procedure Laterality Date  . ABDOMINAL AORTIC ENDOVASCULAR STENT GRAFT N/A 04/11/2013   Procedure: ABDOMINAL AORTIC ENDOVASCULAR STENT GRAFT WITH RIGHT FEMORAL PATCH ANGIOPLASTY;  Surgeon: Mal Misty, MD;  Location: [facility];  Service: Vascular;  Laterality: N/A;  . BREAST SURGERY     LEFT BREAST BIOPSY  . broken leg Left 1970s  . CARDIAC CATHETERIZATION    . CHOLECYSTECTOMY  1972  . COLONOSCOPY    . EYE SURGERY Bilateral   . Laser vein procedure    . REFRACTIVE SURGERY    . TONSILLECTOMY     Social History   Occupational History  . Not on file  Tobacco Use  . Smoking status: Former Smoker    Types: Cigarettes    Last attempt to quit: 08/10/2002    Years since quitting: 14.6  . Smokeless tobacco: Never Used  . Tobacco comment: History of tobacco abuse  Substance and Sexual Activity  . Alcohol use: Yes    Alcohol/week: 0.0 oz    Comment: rare  . Drug use: No  . Sexual activity: Yes    Birth control/protection: Post-menopausal

## 2017-04-11 ENCOUNTER — Other Ambulatory Visit: Payer: Self-pay

## 2017-04-11 MED ORDER — POTASSIUM CHLORIDE CRYS ER 20 MEQ PO TBCR: EXTENDED_RELEASE_TABLET | ORAL | 1 refills | Status: DC

## 2017-04-12 ENCOUNTER — Encounter: Payer: Self-pay | Admitting: Physician Assistant

## 2017-04-12 ENCOUNTER — Ambulatory Visit (INDEPENDENT_AMBULATORY_CARE_PROVIDER_SITE_OTHER): Payer: Medicare Other | Admitting: Physician Assistant

## 2017-04-12 ENCOUNTER — Telehealth (INDEPENDENT_AMBULATORY_CARE_PROVIDER_SITE_OTHER): Payer: Self-pay

## 2017-04-12 VITALS — BP 142/80 | HR 95 | Temp 97.9°F | Resp 16 | Ht 63.5 in | Wt 201.4 lb

## 2017-04-12 DIAGNOSIS — I482 Chronic atrial fibrillation, unspecified: Secondary | ICD-10-CM

## 2017-04-12 DIAGNOSIS — H1133 Conjunctival hemorrhage, bilateral: Secondary | ICD-10-CM

## 2017-04-12 DIAGNOSIS — Z7901 Long term (current) use of anticoagulants: Secondary | ICD-10-CM | POA: Diagnosis not present

## 2017-04-12 DIAGNOSIS — E039 Hypothyroidism, unspecified: Secondary | ICD-10-CM | POA: Diagnosis not present

## 2017-04-12 DIAGNOSIS — J449 Chronic obstructive pulmonary disease, unspecified: Secondary | ICD-10-CM

## 2017-04-12 DIAGNOSIS — J4489 Other specified chronic obstructive pulmonary disease: Secondary | ICD-10-CM

## 2017-04-12 NOTE — Telephone Encounter (Signed)
Will you call her and let her know Artis Delay got her a "sample" Monovisc, and she is welcome to make an appointment whenever with him to get this injection NO cost to her-it's a free sample

## 2017-04-12 NOTE — Progress Notes (Signed)
Subjective:    Patient ID: Katie [name], female    DOB: [birth year], 77 y.o.   MRN: [id number]  HPI 77 y.o. WF with history of ocular rosacea, PVD, afib, on coumadin.  She states her right eye was blood shot yesterday afternoon, woke up this AM and both eyes were blood shot. Went to see Dr. Katy Fitch today, had normal vision exam. She has been coughing, no sneezing. Her eye are watering, not itching, no pain. She has been on prednisone drops in her right eye for ocular roseacea but stopped a few days go.  No blood in her stool, no bloody nose. BP has been good home.   She is on 1 pill Mon, Wed, Friday, 1.5 she is on 4 days a week, increase recently.   Lab Results  Component Value Date   INR 1.4 (H) 03/21/2017   INR 1.3 (H) 02/09/2017   INR 3.6 (H) 01/06/2017   She is on thyroid medication. Her medication was changed last visit, she is on 1.5 pills a day.   Lab Results  Component Value Date   TSH 7.18 (H) 03/21/2017  .   Blood pressure (!) 142/80, pulse 95, temperature 97.9 F (36.6 C), resp. rate 16, height 5' 3.5" (1.613 m), weight 201 lb 6.4 oz (91.4 kg), SpO2 98 %.  Medications Current Outpatient Medications on File Prior to Visit  Medication Sig  . acetaminophen (TYLENOL) 500 MG tablet Take 1,000 mg by mouth 2 (two) times daily as needed for moderate pain.  Marland Kitchen albuterol (PROVENTIL HFA;VENTOLIN HFA) 108 (90 Base) MCG/ACT inhaler Inhale 2 puffs into the lungs every 4 (four) hours as needed for shortness of breath. Inhale 2 puffs 5-10 min apart every 4 to 6 hrs- to rescue asthma  . budesonide (PULMICORT) 0.25 MG/2ML nebulizer solution Take 2 mLs (0.25 mg total) by nebulization 2 (two) times daily.  . cetirizine (ZYRTEC) 10 MG tablet TAKE 1 CAPSULE (10 MG TOTAL) BY MOUTH AT BEDTIME.  Marland Kitchen Cholecalciferol (VITAMIN D3) 5000 units CAPS Takes 2 caps (10,000 units) daily  . digoxin (LANOXIN) 0.25 MG tablet Take 0.125 mg by mouth daily.  Marland Kitchen diltiazem (TIAZAC) 120 MG 24 hr capsule TAKE 1 CAPSULE  BY MOUTH EVERY DAY  . Fluticasone-Umeclidin-Vilant (TRELEGY ELLIPTA) 100-62.5-25 MCG/INH AEPB Inhale 1 puff into the lungs daily.  . furosemide (LASIX) 80 MG tablet TAKE 1 TABLET BY MOUTH TWICE A DAY AS NEEDED FOR FLUID  . gabapentin (NEURONTIN) 600 MG tablet Takes 1/2 to 1 tablet 3 times daily.  Marland Kitchen levothyroxine (SYNTHROID, LEVOTHROID) 50 MCG tablet TAKE 1 TABLET BY MOUTH EVERY DAY (Patient taking differently: Take 1.5 tablets on M,W,F and 1 tablet all other days)  . montelukast (SINGULAIR) 10 MG tablet TAKE 1 TABLET EVERY DAY FOR ALLERGIES  . Olopatadine HCl 0.2 % SOLN Place 1 drop into both eyes daily.  . pantoprazole (PROTONIX) 40 MG tablet Take 1 tablet (40 mg total) by mouth daily.  [name] Glycol-Propyl Glycol (SYSTANE OP) Apply to eye 4 (four) times daily as needed.  . potassium chloride SA (KLOR-CON M20) 20 MEQ tablet TAKE 2 TABLETS (40 MEQ TOTAL) BY MOUTH DAILY.  . pravastatin (PRAVACHOL) 40 MG tablet TAKE 1 TABLET BY MOUTH AT BEDTIME FOR CHOLESTEROL  . prednisoLONE acetate (PRED FORTE) 1 % ophthalmic suspension INSTILL 1 DROP INTO BOTH EYES TWICE A DAY  . prochlorperazine (COMPAZINE) 5 MG tablet TAKE 1 TABLET BY MOUTH THREE TIMES A DAY AS NEEDED FOR VERTIGO OR NAUSEA  . triamcinolone  cream (KENALOG) 0.1 % Apply 1 application topically 2 (two) times daily. Apply to rash  2 x / day  . warfarin (COUMADIN) 3 MG tablet Take 3 mg by mouth daily.   No current facility-administered medications on file prior to visit.     Problem list She has Migraine headache; Essential hypertension; Coronary atherosclerosis; Atrial fibrillation ([place]); Congestive heart failure (HCC); COPD (chronic obstructive pulmonary disease) with chronic bronchitis ([place]); GERD; Osteopenia; Hypothyroidism; Vitamin D deficiency; Long term current use of anticoagulant therapy; CKD  stage III (GFR 51 ml/min); Hyperlipidemia; Medication management; Pulmonary Fibrosis sequellae of Amiodarone; PVD (peripheral vascular disease)  with claudication ([place]); Morbid obesity due to excess calories [facility]); AAA (abdominal aortic aneurysm) without rupture ([place]); Other abnormal glucose; Hypotension; Chest pain; Hyponatremia; Chronic systolic CHF (congestive heart failure) ([place]); and Gout on their problem list.   Review of Systems See HPI    Objective:   Physical Exam  Constitutional: She is oriented to person, place, and time. She appears well-developed and well-nourished.  HENT:  Head: Normocephalic and atraumatic.  Right Ear: Decreased hearing is noted.  Left Ear: Decreased hearing is noted.  Nose: Right sinus exhibits no maxillary sinus tenderness and no frontal sinus tenderness. Left sinus exhibits no maxillary sinus tenderness and no frontal sinus tenderness.  Effusion bilateral ears  Eyes: EOM are normal. Pupils are equal, round, and reactive to light. Right eye exhibits no discharge and no exudate. Left eye exhibits no discharge and no exudate. Right conjunctiva is not injected. Right conjunctiva has a hemorrhage. Left conjunctiva is not injected. Left conjunctiva has a hemorrhage.  Neck: Normal range of motion. Neck supple.  Cardiovascular: Normal rate. An irregularly irregular rhythm present. Exam reveals decreased pulses.  Murmur heard.  Systolic murmur is present. Pulmonary/Chest: Effort normal. No accessory muscle usage. No respiratory distress. She has decreased breath sounds. She has no wheezes. She has no rhonchi. She has no rales.  Abdominal: Soft. Bowel sounds are normal.  Musculoskeletal: She exhibits edema (mild).       Lumbar back: She exhibits normal range of motion, no tenderness, no bony tenderness, no swelling, no edema, no deformity, no laceration, no pain, no spasm and normal pulse.  Lymphadenopathy:    She has no cervical adenopathy.  Neurological: She is alert and oriented to person, place, and time. She has normal strength. No cranial nerve deficit or sensory deficit.  Skin: Skin is warm and dry.        Assessment & Plan:    Subconjunctival hemorrhage, bilateral -     CBC with Differential/Platelet -     BASIC METABOLIC PANEL WITH GFR -     Hepatic function panel -     Protime-INR - check labs especially INR with recent increase - cool compresses, has seen eye doctor with normal exam  Chronic atrial fibrillation ([place]) Check inr  Long term current use of anticoagulant therapy -     Protime-INR  COPD (chronic obstructive pulmonary disease) with chronic bronchitis (HCC) No triggers, well controlled symptoms, cont to monitor  Hypothyroidism, unspecified type -     CBC with Differential/Platelet -     BASIC METABOLIC PANEL WITH GFR -     Hepatic function panel -     TSH   PENDING LABS MAY CANCEL 02/11 APPOINTMENT AND SCHEDULE 1 MONTH OUT Future Appointments  Date Time Provider [place]  04/14/2017  3:30 PM Pete Pelt, PA-C [facility] None  04/25/2017 11:00 AM Liane Comber, NP [facility] None  04/26/2017  8:00 AM [facility] V[facility] 4 [facility]  04/26/2017  9:00 AM [facility] VASC 4 [facility]  04/26/2017  9:45 AM Nickel, Sharmon Leyden, NP [facility]  02/02/2018  9:00 AM [name], MD [facility] None

## 2017-04-12 NOTE — Telephone Encounter (Signed)
Patient is scheduled 04/14/17 at 3:30pm for monovisc injection with Artis Delay.

## 2017-04-12 NOTE — Patient Instructions (Addendum)
Do cool compresses  Subconjunctival Hemorrhage Subconjunctival hemorrhage is bleeding that happens between the white part of your eye (sclera) and the clear membrane that covers the outside of your eye (conjunctiva). There are many tiny blood vessels near the surface of your eye. A subconjunctival hemorrhage happens when one or more of these vessels breaks and bleeds, causing a red patch to appear on your eye. This is similar to a bruise. Depending on the amount of bleeding, the red patch may only cover a small area of your eye or it may cover the entire visible part of the sclera. If a lot of blood collects under the conjunctiva, there may also be swelling. Subconjunctival hemorrhages do not affect your vision or cause pain, but your eye may feel irritated if there is swelling. Subconjunctival hemorrhages usually do not require treatment, and they disappear on their own within two weeks. What are the causes? This condition may be caused by:  Mild trauma, such as rubbing your eye too hard.  Severe trauma or blunt injuries.  Coughing, sneezing, or vomiting.  Straining, such as when lifting a heavy object.  High blood pressure.  Recent eye surgery.  A history of diabetes.  Certain medicines, especially blood thinners (anticoagulants).  Other conditions, such as eye tumors, bleeding disorders, or blood vessel abnormalities.  Subconjunctival hemorrhages can happen without an obvious cause. What are the signs or symptoms? Symptoms of this condition include:  A bright red or dark red patch on the white part of the eye. ? The red area may spread out to cover a larger area of the eye before it goes away. ? The red area may turn brownish-yellow before it goes away.  Swelling.  Mild eye irritation.  How is this diagnosed? This condition is diagnosed with a physical exam. If your subconjunctival hemorrhage was caused by trauma, your health care provider may refer you to an eye specialist  (ophthalmologist) or another specialist to check for other injuries. You may have other tests, including:  An eye exam.  A blood pressure check.  Blood tests to check for bleeding disorders.  If your subconjunctival hemorrhage was caused by trauma, X-rays or a CT scan may be done to check for other injuries. How is this treated? Usually, no treatment is needed. Your health care provider may recommend eye drops or cold compresses to help with discomfort. Follow these instructions at home:  Take over-the-counter and prescription medicines only as directed by your health care provider.  Use eye drops or cold compresses to help with discomfort as directed by your health care provider.  Avoid activities, things, and environments that may irritate or injure your eye.  Keep all follow-up visits as told by your health care provider. This is important. Contact a health care provider if:  You have pain in your eye.  The bleeding does not go away within 3 weeks.  You keep getting new subconjunctival hemorrhages. Get help right away if:  Your vision changes or you have difficulty seeing.  You suddenly develop severe sensitivity to light.  You develop a severe headache, persistent vomiting, confusion, or abnormal tiredness (lethargy).  Your eye seems to bulge or protrude from your eye socket.  You develop unexplained bruises on your body.  You have unexplained bleeding in another area of your body. This information is not intended to replace advice given to you by your health care provider. Make sure you discuss any questions you have with your health care provider. Document Released: 03/01/2005  Document Revised: 10/26/2015 Document Reviewed: 05/08/2014 Elsevier Interactive Patient Education  [employer].

## 2017-04-13 ENCOUNTER — Ambulatory Visit: Payer: Self-pay | Admitting: Physician Assistant

## 2017-04-13 LAB — BASIC METABOLIC PANEL WITH GFR
BUN / CREAT RATIO: 10 (calc) (ref 6–22)
BUN: 13 mg/dL (ref 7–25)
CHLORIDE: 104 mmol/L (ref 98–110)
CO2: 30 mmol/L (ref 20–32)
Calcium: 10 mg/dL (ref 8.6–10.4)
Creat: 1.32 mg/dL — ABNORMAL HIGH (ref 0.60–0.93)
GFR, EST AFRICAN AMERICAN: 46 mL/min/{1.73_m2} — AB (ref >=60)
GFR, Est Non African American: 39 mL/min/{1.73_m2} — ABNORMAL LOW (ref >=60)
GLUCOSE: 100 mg/dL — AB (ref 65–99)
Potassium: 4.4 mmol/L (ref 3.5–5.3)
SODIUM: 143 mmol/L (ref 135–146)

## 2017-04-13 LAB — PROTIME-INR
INR: 3.5 — AB
Prothrombin Time: 36.2 s — ABNORMAL HIGH (ref 9.0–11.5)

## 2017-04-13 LAB — CBC WITH DIFFERENTIAL/PLATELET
BASOS ABS: 40 {cells}/uL (ref 0–200)
Basophils Relative: 0.5 %
Eosinophils Absolute: 205 cells/uL (ref 15–500)
Eosinophils Relative: 2.6 %
HCT: 48.2 % — ABNORMAL HIGH (ref 35.0–45.0)
Hemoglobin: 16.4 g/dL — ABNORMAL HIGH (ref 11.7–15.5)
Lymphs Abs: 2323 cells/uL (ref 850–3900)
MCH: 29.4 pg (ref 27.0–33.0)
MCHC: 34 g/dL (ref 32.0–36.0)
MCV: 86.4 fL (ref 80.0–100.0)
MONOS PCT: 10 %
MPV: 10.7 fL (ref 7.5–12.5)
NEUTROS PCT: 57.5 %
Neutro Abs: 4543 cells/uL (ref 1500–7800)
PLATELETS: 243 10*3/uL (ref 140–400)
RBC: 5.58 10*6/uL — ABNORMAL HIGH (ref 3.80–5.10)
RDW: 14.4 % (ref 11.0–15.0)
TOTAL LYMPHOCYTE: 29.4 %
WBC mixed population: 790 cells/uL (ref 200–950)
WBC: 7.9 10*3/uL (ref 3.8–10.8)

## 2017-04-13 LAB — HEPATIC FUNCTION PANEL
AG Ratio: 1.6 (calc) (ref 1.0–2.5)
ALKALINE PHOSPHATASE (APISO): 43 U/L (ref 33–130)
ALT: 14 U/L (ref 6–29)
AST: 15 U/L (ref 10–35)
Albumin: 3.9 g/dL (ref 3.6–5.1)
BILIRUBIN INDIRECT: 0.4 mg/dL (ref 0.2–1.2)
Bilirubin, Direct: 0.1 mg/dL (ref 0.0–0.2)
Globulin: 2.5 g/dL (calc) (ref 1.9–3.7)
TOTAL PROTEIN: 6.4 g/dL (ref 6.1–8.1)
Total Bilirubin: 0.5 mg/dL (ref 0.2–1.2)

## 2017-04-13 LAB — TSH: TSH: 4.48 mIU/L (ref 0.40–4.50)

## 2017-04-14 ENCOUNTER — Ambulatory Visit (INDEPENDENT_AMBULATORY_CARE_PROVIDER_SITE_OTHER): Payer: Medicare Other | Admitting: Physician Assistant

## 2017-04-14 ENCOUNTER — Encounter (INDEPENDENT_AMBULATORY_CARE_PROVIDER_SITE_OTHER): Payer: Self-pay | Admitting: Physician Assistant

## 2017-04-14 DIAGNOSIS — M25572 Pain in left ankle and joints of left foot: Secondary | ICD-10-CM

## 2017-04-14 DIAGNOSIS — M1711 Unilateral primary osteoarthritis, right knee: Secondary | ICD-10-CM

## 2017-04-14 MED ORDER — HYALURONAN 88 MG/4ML IX SOSY
88.0000 mg | PREFILLED_SYRINGE | INTRA_ARTICULAR | Status: AC | PRN
  Administered 2017-04-14: 88 mg via INTRA_ARTICULAR

## 2017-04-14 MED ORDER — LIDOCAINE HCL 1 % IJ SOLN
2.0000 mL | INTRAMUSCULAR | Status: AC | PRN
  Administered 2017-04-14: 2 mL

## 2017-04-14 MED ORDER — METHYLPREDNISOLONE ACETATE 40 MG/ML IJ SUSP
40.0000 mg | INTRAMUSCULAR | Status: AC | PRN
  Administered 2017-04-14: 40 mg via INTRA_ARTICULAR

## 2017-04-14 NOTE — Progress Notes (Signed)
   Procedure Note  Patient: Katie [name]             Date of Birth: 1940/12/22           MRN: [id number]             Visit Date: 04/14/2017  Procedures: Visit Diagnoses: Sinus tarsi syndrome of left ankle  Primary osteoarthritis of right knee Ms. Sneed comes in today with right knee pain known osteoarthritis in the knee for a Monovisc injection.  She supplies sample not Monovisc.  She is also having left ankle pain and is requesting a cortisone injection in the ankle it has been bothering her for the past 2 weeks.  She has known post traumatic arthritis of the left ankle  Large Joint Inj on 04/14/2017 5:17 PM Indications: pain Details: 22 G 1.5 in needle, anterolateral approach  Arthrogram: No  Medications: 88 mg Hyaluronan 88 MG/4ML Outcome: tolerated well, no immediate complications Procedure, treatment alternatives, risks and benefits explained, specific risks discussed. Consent was given by the patient. Immediately prior to procedure a time out was called to verify the correct patient, procedure, equipment, support staff and site/side marked as required. Patient was prepped and draped in the usual sterile fashion.   Medium Joint Inj (Sinus Tarsi left ankle) on 04/14/2017 5:17 PM Details: anterior approach Medications: 2 mL lidocaine 1 %; 40 mg methylPREDNISolone acetate 40 MG/ML    Plan: She will follow-up on an as-needed basis.  She denies she can only have the Monovisc injections every 6 months.  Regards to the ankle she will use her ankle brace as needed.  Periodic ankle and sinus tarsi injections as needed as frequent as every 3 months.

## 2017-04-22 ENCOUNTER — Ambulatory Visit (INDEPENDENT_AMBULATORY_CARE_PROVIDER_SITE_OTHER): Payer: Medicare Other | Admitting: Internal Medicine

## 2017-04-22 VITALS — BP 116/72 | HR 72 | Temp 97.9°F | Resp 16 | Ht 63.5 in | Wt 198.8 lb

## 2017-04-22 DIAGNOSIS — E039 Hypothyroidism, unspecified: Secondary | ICD-10-CM | POA: Diagnosis not present

## 2017-04-22 DIAGNOSIS — Z79899 Other long term (current) drug therapy: Secondary | ICD-10-CM | POA: Diagnosis not present

## 2017-04-22 DIAGNOSIS — E559 Vitamin D deficiency, unspecified: Secondary | ICD-10-CM | POA: Diagnosis not present

## 2017-04-22 DIAGNOSIS — M109 Gout, unspecified: Secondary | ICD-10-CM | POA: Diagnosis not present

## 2017-04-22 DIAGNOSIS — R7309 Other abnormal glucose: Secondary | ICD-10-CM

## 2017-04-22 DIAGNOSIS — Z7901 Long term (current) use of anticoagulants: Secondary | ICD-10-CM | POA: Diagnosis not present

## 2017-04-22 DIAGNOSIS — E782 Mixed hyperlipidemia: Secondary | ICD-10-CM

## 2017-04-22 DIAGNOSIS — J441 Chronic obstructive pulmonary disease with (acute) exacerbation: Secondary | ICD-10-CM | POA: Diagnosis not present

## 2017-04-22 DIAGNOSIS — I10 Essential (primary) hypertension: Secondary | ICD-10-CM | POA: Diagnosis not present

## 2017-04-22 DIAGNOSIS — J0140 Acute pansinusitis, unspecified: Secondary | ICD-10-CM

## 2017-04-22 DIAGNOSIS — I482 Chronic atrial fibrillation, unspecified: Secondary | ICD-10-CM

## 2017-04-22 MED ORDER — AZITHROMYCIN 250 MG PO TABS: ORAL_TABLET | ORAL | 1 refills | Status: DC

## 2017-04-22 MED ORDER — PREDNISONE 20 MG PO TABS: ORAL_TABLET | ORAL | 0 refills | Status: DC

## 2017-04-22 MED ORDER — PROMETHAZINE-DM 6.25-15 MG/5ML PO SYRP: ORAL_SOLUTION | ORAL | 1 refills | Status: DC

## 2017-04-22 NOTE — Patient Instructions (Signed)

## 2017-04-22 NOTE — Progress Notes (Signed)
This very nice 77 y.o. MWF presents for 3 month follow up with Hypertension, Hyperlipidemia, Pre-Diabetes and Vitamin D Deficiency. Patient has hx/o Gout and had been off her meds and was advised to restart 1/2 tablet daily. She is currently asymptomatic.  Patient is also c/o head congestion, sinus drainage and a productive cough.      Patient is treated for HTN (1999) & BP has been controlled at home. Today's BP is at goal - 116/72. Patient has stage 3 CKD consequent of her HTN.  She has a non-ischemic cardiomyopathy  And combined sys/dias heart failure and Chronic Afib (1999). Lexiscan showed EF 50% in 2014. In Jan 2015, she had an endovascular graft placed. Patient has had no complaints of any cardiac type chest pain, palpitations, dyspnea / orthopnea / PND, dizziness, claudication, or dependent edema.     Hyperlipidemia is controlled with diet & meds. Patient denies myalgias or other med SE's. Last Lipids were  Lab Results  Component Value Date   CHOL 158 01/06/2017   HDL 36 (L) 01/06/2017   LDLCALC 79 09/17/2016   TRIG 147 01/06/2017   CHOLHDL 4.4 01/06/2017      Also, the patient has history of Morbid Obesity (BMI 34+) and PreDiabetes and has had no symptoms of reactive hypoglycemia, diabetic polys, paresthesias or visual blurring.  Last A1c was not at goal: Lab Results  Component Value Date   HGBA1C 5.8 (H) 01/06/2017      Patient was dx'd Hypothyroid in 2012 and has since been on replacement. Further, the patient also has history of Vitamin D Deficiency and supplements vitamin D without any suspected side-effects. Last vitamin D was   Lab Results  Component Value Date   VD25OH 55 01/06/2017   Current Outpatient Medications on File Prior to Visit  Medication Sig  . acetaminophen (TYLENOL) 500 MG tablet Take 1,000 mg by mouth 2 (two) times daily as needed for moderate pain.  Marland Kitchen albuterol (PROVENTIL HFA;VENTOLIN HFA) 108 (90 Base) MCG/ACT inhaler Inhale 2 puffs into the lungs  every 4 (four) hours as needed for shortness of breath. Inhale 2 puffs 5-10 min apart every 4 to 6 hrs- to rescue asthma  . budesonide (PULMICORT) 0.25 MG/2ML nebulizer solution Take 2 mLs (0.25 mg total) by nebulization 2 (two) times daily.  . cetirizine (ZYRTEC) 10 MG tablet TAKE 1 CAPSULE (10 MG TOTAL) BY MOUTH AT BEDTIME.  Marland Kitchen Cholecalciferol (VITAMIN D3) 5000 units CAPS Takes 2 caps (10,000 units) daily  . digoxin (LANOXIN) 0.25 MG tablet Take 0.125 mg by mouth daily.  Marland Kitchen diltiazem (TIAZAC) 120 MG 24 hr capsule TAKE 1 CAPSULE BY MOUTH EVERY DAY  . furosemide (LASIX) 80 MG tablet TAKE 1 TABLET BY MOUTH TWICE A DAY AS NEEDED FOR FLUID  . gabapentin (NEURONTIN) 600 MG tablet Takes 1/2 to 1 tablet 3 times daily.  Marland Kitchen levothyroxine (SYNTHROID, LEVOTHROID) 50 MCG tablet TAKE 1 TABLET BY MOUTH EVERY DAY (Patient taking differently: Take 1.5 tablets on M,W,F and 1 tablet all other days)  . montelukast (SINGULAIR) 10 MG tablet TAKE 1 TABLET EVERY DAY FOR ALLERGIES  . pantoprazole (PROTONIX) 40 MG tablet Take 1 tablet (40 mg total) by mouth daily.  [name] Glycol-Propyl Glycol (SYSTANE OP) Apply to eye 4 (four) times daily as needed.  . potassium chloride SA (KLOR-CON M20) 20 MEQ tablet TAKE 2 TABLETS (40 MEQ TOTAL) BY MOUTH DAILY.  . pravastatin (PRAVACHOL) 40 MG tablet TAKE 1 TABLET BY MOUTH AT BEDTIME  FOR CHOLESTEROL  . prednisoLONE acetate (PRED FORTE) 1 % ophthalmic suspension INSTILL 1 DROP INTO BOTH EYES TWICE A DAY  . prochlorperazine (COMPAZINE) 5 MG tablet TAKE 1 TABLET BY MOUTH THREE TIMES A DAY AS NEEDED FOR VERTIGO OR NAUSEA  . triamcinolone cream (KENALOG) 0.1 % Apply 1 application topically 2 (two) times daily. Apply to rash  2 x / day  . warfarin (COUMADIN) 3 MG tablet Take 3 mg by mouth daily.   No current facility-administered medications on file prior to visit.    Allergies  Allergen Reactions  . Amiodarone Other (See Comments)    PULMONARY TOXICITY  . Diovan [Valsartan] Other  (See Comments)    HYPOTENSION  . Doxycycline Diarrhea and Other (See Comments)    VISUAL DISTURBANCE  . Flexeril [Cyclobenzaprine] Other (See Comments)    FATIGUE  . Keflex [Cephalexin] Diarrhea  . Verapamil Other (See Comments)    EDEMA  . Codeine Other (See Comments)    unknown   PMHx:   Past Medical History:  Diagnosis Date  . Atrial fibrillation ([place])   . Migraine headache   . Osteopenia    Immunization History  Administered Date(s) Administered  . Influenza, High Dose Seasonal PF 12/20/2013, 12/09/2014, 11/14/2015, 01/10/2017  . Influenza-Unspecified 01/02/2013  . PPD Test 07/17/2016  . Pneumococcal Conjugate-13 01/23/2014  . Pneumococcal-Unspecified 03/15/1993, 05/31/2008  . Td 03/16/2007  . Varicella 02/19/2008   Past Surgical History:  Procedure Laterality Date  . ABDOMINAL AORTIC ENDOVASCULAR STENT GRAFT N/A 04/11/2013   Procedure: ABDOMINAL AORTIC ENDOVASCULAR STENT GRAFT WITH RIGHT FEMORAL PATCH ANGIOPLASTY;  Surgeon: Mal Misty, MD;  Location: [facility];  Service: Vascular;  Laterality: N/A;  . BREAST SURGERY     LEFT BREAST BIOPSY  . broken leg Left 1970s  . CARDIAC CATHETERIZATION    . CHOLECYSTECTOMY  1972  . COLONOSCOPY    . EYE SURGERY Bilateral   . Laser vein procedure    . REFRACTIVE SURGERY    . TONSILLECTOMY     FHx:    Reviewed / unchanged  SHx:    Reviewed / unchanged  Systems Review:  Constitutional: Denies fever, chills, wt changes, headaches, insomnia, fatigue, night sweats, change in appetite. Eyes: Denies redness, blurred vision, diplopia, discharge, itchy, watery eyes.  ENT: Denies discharge, congestion, post nasal drip, epistaxis, sore throat, earache, hearing loss, dental pain, tinnitus, vertigo, sinus pain, snoring.  CV: Denies chest pain, palpitations, irregular heartbeat, syncope, dyspnea, diaphoresis, orthopnea, PND, claudication or edema. Respiratory: denies cough, dyspnea, DOE, pleurisy, hoarseness, laryngitis, wheezing.    Gastrointestinal: Denies dysphagia, odynophagia, heartburn, reflux, water brash, abdominal pain or cramps, nausea, vomiting, bloating, diarrhea, constipation, hematemesis, melena, hematochezia  or hemorrhoids. Genitourinary: Denies dysuria, frequency, urgency, nocturia, hesitancy, discharge, hematuria or flank pain. Musculoskeletal: Denies arthralgias, myalgias, stiffness, jt. swelling, pain, limping or strain/sprain.  Skin: Denies pruritus, rash, hives, warts, acne, eczema or change in skin lesion(s). Neuro: No weakness, tremor, incoordination, spasms, paresthesia or pain. Psychiatric: Denies confusion, memory loss or sensory loss. Endo: Denies change in weight, skin or hair change.  Heme/Lymph: No excessive bleeding, bruising or enlarged lymph nodes.  Physical Exam  BP 116/72   Pulse 72   Temp 97.9 F (36.6 C)   Resp 16   Ht 5' 3.5" (1.613 m)   Wt 198 lb 12.8 oz (90.2 kg)   BMI 34.66 kg/m   Appears over  nourished, well groomed  and in no distress. (+) congested cough and sl hoarse, but no stridor  Eyes: PERRLA, EOMs, conjunctiva no swelling or erythema. Sinuses: (+)  frontal/maxillary tenderness ENT/Mouth: EAC's clear, TM's nl w/o erythema, bulging. Nares clear w/o erythema, swelling, exudates. Oropharynx clear without erythema or exudates. Oral hygiene is good. Tongue normal, non obstructing. Hearing intact.  Neck: Supple. Thyroid not palpable. Car 2+/2+ without bruits, nodes or JVD. Chest: Respirations nl w/scattered coarse  Rales & rhonchi and no wheezing or stridor.  Cor: Heart sounds soft w/ir regular rate and rhythm. Peripheral pulses normal and equal  without edema.  Abdomen: Soft & bowel sounds normal. Non-tender w/o guarding, rebound, hernias, masses or organomegaly.  Lymphatics: Unremarkable.  Musculoskeletal: Full ROM all peripheral extremities, joint stability, 5/5 strength and normal gait.  Skin: Warm, dry without exposed rashes, lesions or ecchymosis apparent.   Neuro: Cranial nerves intact, reflexes equal bilaterally. Sensory-motor testing grossly intact. Tendon reflexes grossly intact.  Pysch: Alert & oriented x 3.  Insight and judgement nl & appropriate. No ideations.  Assessment and Plan:  1. Essential hypertension  - Continue medication, monitor blood pressure at home.  - Continue DASH diet. Reminder to go to the ER if any CP,  SOB, nausea, dizziness, severe HA, changes vision/speech.  - CBC with Differential/Platelet - BASIC METABOLIC PANEL WITH GFR - Magnesium - TSH  2. Hyperlipidemia, mixed  - Continue diet/meds, exercise,& lifestyle modifications.  - Continue monitor periodic cholesterol/liver & renal functions   - Hepatic function panel - Lipid panel - TSH  3. Abnormal glucose  - Continue diet, exercise, lifestyle modifications.  - Monitor appropriate labs.  - Hemoglobin A1c - Insulin, random  4. Vitamin D deficiency  - Continue supplementation.  - VITAMIN D 25 Hydroxy   5. Gout  - Uric acid  6. Chronic atrial fibrillation (HCC)  - Protime-INR  7. Hypothyroidism  - TSH  8. Acute  pansinusitis  - predniSONE (DELTASONE) 20 MG tablet; 1 tab 3 x day for 3 days, then 1 tab 2 x day for 3 days, then 1 tab 1 x day for 5 days  Dispense: 20 tablet  - azithromycin (ZITHROMAX) 250 MG tablet; Take 2 tablets (500 mg) on  Day 1,  followed by 1 tablet (250 mg) once daily on Days 2 through 5.  Dispense: 6 each; Refill: 1  9. COPD with acute exacerbation (HCC)  - azithromycin (ZITHROMAX) 250 MG tablet; Take 2 tablets (500 mg) on  Day 1,  followed by 1 tablet (250 mg) once daily on Days 2 through 5.  Dispense: 6 each; Refill: 1  - promethazine-dextromethorphan (PROMETHAZINE-DM) 6.25-15 MG/5ML syrup; Take 1 to 2 tsp enery 4 hours if needed for cough  Dispense: 360 mL; Refill: 1  10. Long term current use of anticoagulant therapy  - Protime-INR  11. Medication management  - CBC with Differential/Platelet - BASIC  METABOLIC PANEL WITH GFR - Hepatic function panel - Magnesium - Lipid panel - TSH - Hemoglobin A1c - Insulin, random - VITAMIN D 25 Hydroxy - Protime-INR - Uric acid       Discussed  regular exercise, BP monitoring, weight control to achieve/maintain BMI less than 25 and discussed med and SE's. Recommended labs to assess and monitor clinical status with further disposition pending results of labs. Over 30 minutes of exam, counseling, chart review was performed.

## 2017-04-23 LAB — DIGOXIN LEVEL: Digoxin Level: 1.2 mcg/L (ref 0.8–2.0)

## 2017-04-24 ENCOUNTER — Other Ambulatory Visit: Payer: Self-pay | Admitting: Internal Medicine

## 2017-04-24 ENCOUNTER — Encounter: Payer: Self-pay | Admitting: Internal Medicine

## 2017-04-24 DIAGNOSIS — I482 Chronic atrial fibrillation, unspecified: Secondary | ICD-10-CM

## 2017-04-24 MED ORDER — DIGOXIN 125 MCG PO TABS: ORAL_TABLET | ORAL | 3 refills | Status: DC

## 2017-04-25 ENCOUNTER — Ambulatory Visit: Payer: Self-pay | Admitting: Adult Health

## 2017-04-25 LAB — HEMOGLOBIN A1C
EAG (MMOL/L): 6.6 (calc)
Hgb A1c MFr Bld: 5.8 % of total Hgb — ABNORMAL HIGH (ref <5.7)
MEAN PLASMA GLUCOSE: 120 (calc)

## 2017-04-25 LAB — HEPATIC FUNCTION PANEL
AG Ratio: 1.6 (calc) (ref 1.0–2.5)
ALBUMIN MSPROF: 4.2 g/dL (ref 3.6–5.1)
ALT: 14 U/L (ref 6–29)
AST: 15 U/L (ref 10–35)
Alkaline phosphatase (APISO): 40 U/L (ref 33–130)
BILIRUBIN DIRECT: 0.2 mg/dL (ref 0.0–0.2)
BILIRUBIN INDIRECT: 0.4 mg/dL (ref 0.2–1.2)
GLOBULIN: 2.6 g/dL (ref 1.9–3.7)
Total Bilirubin: 0.6 mg/dL (ref 0.2–1.2)
Total Protein: 6.8 g/dL (ref 6.1–8.1)

## 2017-04-25 LAB — BASIC METABOLIC PANEL WITH GFR
BUN / CREAT RATIO: 14 (calc) (ref 6–22)
BUN: 17 mg/dL (ref 7–25)
CALCIUM: 10.5 mg/dL — AB (ref 8.6–10.4)
CO2: 28 mmol/L (ref 20–32)
Chloride: 99 mmol/L (ref 98–110)
Creat: 1.21 mg/dL — ABNORMAL HIGH (ref 0.60–0.93)
GFR, EST AFRICAN AMERICAN: 51 mL/min/{1.73_m2} — AB (ref >=60)
GFR, EST NON AFRICAN AMERICAN: 44 mL/min/{1.73_m2} — AB (ref >=60)
Glucose, Bld: 89 mg/dL (ref 65–99)
POTASSIUM: 4.2 mmol/L (ref 3.5–5.3)
Sodium: 142 mmol/L (ref 135–146)

## 2017-04-25 LAB — CBC WITH DIFFERENTIAL/PLATELET
BASOS ABS: 64 {cells}/uL (ref 0–200)
Basophils Relative: 0.7 %
EOS ABS: 237 {cells}/uL (ref 15–500)
Eosinophils Relative: 2.6 %
HEMATOCRIT: 48.4 % — AB (ref 35.0–45.0)
Hemoglobin: 16.3 g/dL — ABNORMAL HIGH (ref 11.7–15.5)
LYMPHS ABS: 1875 {cells}/uL (ref 850–3900)
MCH: 29.2 pg (ref 27.0–33.0)
MCHC: 33.7 g/dL (ref 32.0–36.0)
MCV: 86.6 fL (ref 80.0–100.0)
MPV: 10.4 fL (ref 7.5–12.5)
Monocytes Relative: 10.7 %
NEUTROS PCT: 65.4 %
Neutro Abs: 5951 cells/uL (ref 1500–7800)
PLATELETS: 259 10*3/uL (ref 140–400)
RBC: 5.59 10*6/uL — AB (ref 3.80–5.10)
RDW: 14.7 % (ref 11.0–15.0)
TOTAL LYMPHOCYTE: 20.6 %
WBC: 9.1 10*3/uL (ref 3.8–10.8)
WBCMIX: 974 {cells}/uL — AB (ref 200–950)

## 2017-04-25 LAB — LIPID PANEL
Cholesterol: 185 mg/dL (ref <200)
HDL: 44 mg/dL — ABNORMAL LOW (ref >50)
LDL Cholesterol (Calc): 110 mg/dL (calc) — ABNORMAL HIGH
Non-HDL Cholesterol (Calc): 141 mg/dL (calc) — ABNORMAL HIGH (ref <130)
TRIGLYCERIDES: 195 mg/dL — AB (ref <150)
Total CHOL/HDL Ratio: 4.2 (calc) (ref <5.0)

## 2017-04-25 LAB — PROTIME-INR
INR: 1.8 — AB
Prothrombin Time: 19.4 s — ABNORMAL HIGH (ref 9.0–11.5)

## 2017-04-25 LAB — MAGNESIUM: Magnesium: 2.2 mg/dL (ref 1.5–2.5)

## 2017-04-25 LAB — INSULIN, RANDOM: Insulin: 21.8 u[IU]/mL — ABNORMAL HIGH (ref 2.0–19.6)

## 2017-04-25 LAB — URIC ACID: Uric Acid, Serum: 8.4 mg/dL — ABNORMAL HIGH (ref 2.5–7.0)

## 2017-04-25 LAB — TSH: TSH: 6.69 m[IU]/L — AB (ref 0.40–4.50)

## 2017-04-25 LAB — VITAMIN D 25 HYDROXY (VIT D DEFICIENCY, FRACTURES): VIT D 25 HYDROXY: 80 ng/mL (ref 30–100)

## 2017-04-26 ENCOUNTER — Encounter: Payer: Self-pay | Admitting: Family

## 2017-04-26 ENCOUNTER — Ambulatory Visit (INDEPENDENT_AMBULATORY_CARE_PROVIDER_SITE_OTHER)
Admission: RE | Admit: 2017-04-26 | Discharge: 2017-04-26 | Disposition: A | Discharge status: Home / Self Care | Payer: Medicare Other | Source: Ambulatory Visit | Attending: Family | Admitting: Family

## 2017-04-26 ENCOUNTER — Ambulatory Visit (INDEPENDENT_AMBULATORY_CARE_PROVIDER_SITE_OTHER): Payer: Medicare Other | Admitting: Family

## 2017-04-26 ENCOUNTER — Ambulatory Visit (HOSPITAL_COMMUNITY)
Admission: RE | Admit: 2017-04-26 | Discharge: 2017-04-26 | Disposition: A | Discharge status: Home / Self Care | Payer: Medicare Other | Source: Ambulatory Visit | Attending: Family | Admitting: Family

## 2017-04-26 ENCOUNTER — Other Ambulatory Visit: Payer: Self-pay | Admitting: *Deleted

## 2017-04-26 VITALS — BP 149/90 | HR 91 | Temp 97.3°F | Resp 20 | Ht 63.5 in | Wt 201.0 lb

## 2017-04-26 DIAGNOSIS — Z95828 Presence of other vascular implants and grafts: Secondary | ICD-10-CM | POA: Insufficient documentation

## 2017-04-26 DIAGNOSIS — I739 Peripheral vascular disease, unspecified: Secondary | ICD-10-CM | POA: Diagnosis not present

## 2017-04-26 DIAGNOSIS — I714 Abdominal aortic aneurysm, without rupture, unspecified: Secondary | ICD-10-CM

## 2017-04-26 DIAGNOSIS — I482 Chronic atrial fibrillation, unspecified: Secondary | ICD-10-CM

## 2017-04-26 DIAGNOSIS — I10 Essential (primary) hypertension: Secondary | ICD-10-CM | POA: Insufficient documentation

## 2017-04-26 DIAGNOSIS — Z87891 Personal history of nicotine dependence: Secondary | ICD-10-CM | POA: Diagnosis not present

## 2017-04-26 DIAGNOSIS — E785 Hyperlipidemia, unspecified: Secondary | ICD-10-CM | POA: Diagnosis not present

## 2017-04-26 DIAGNOSIS — Z6835 Body mass index (BMI) 35.0-35.9, adult: Secondary | ICD-10-CM | POA: Diagnosis not present

## 2017-04-26 DIAGNOSIS — E669 Obesity, unspecified: Secondary | ICD-10-CM | POA: Diagnosis not present

## 2017-04-26 MED ORDER — DIGOXIN 125 MCG PO TABS: ORAL_TABLET | ORAL | 3 refills | Status: DC

## 2017-04-26 NOTE — Progress Notes (Signed)
[facility]   [facility]: Follow up s/p Endovascular Repair of Abdominal Aortic Aneurysm    History of Present Illness  Katie [name] is a 77 y.o. (1940-05-10) female returns for continued follow-up regarding her abdominal aortic aneurysm stent graft repair using Gore C3 endovascular graft performed in 2015 by Dr. Kellie Simmering. Patient has no new symptoms of abdominal or back discomfort.   At her 04-22-15 visit with Dr. Kellie Simmering she had a CT angiogram performed which Dr. Kellie Simmering reviewed and interpreted. This revealed aneurysm sac to be contracted down nicely to 3.2 cm in maximum diameter around the graft. Graft was in excellent position. Pt denied specific claudication symptoms but did have weakness in her right leg. At times she stated it was difficult to lift this against gravity. She also has chronic edema in both ankles and takes a diuretic on a daily basis.  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 Kellie Simmering suggested that patient discuss her back problem and right leg weakness with Dr. Melford Aase about possible referral. She did see an orhopod re this, but her back pain has since subsided. ABIs in the past were 1.0 or greater Patient was to return in 1 year with duplex scan of aortic aneurysm stent graft in our office and recheck ABIs.   She denies chest pain or dyspnea on exertion.  Has chronic A. Fib on Coumadin.  She had pneumonia, flu, back pain, severely hypotensive in October 2017, was hospitalized at [facility] x 7 days.   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   Her walking is limited by her right knee issues, had monovist injected in her right knee which helped her pain a great deal, enables her to walk more. Her left knee was replaced and has no issues.   She participates in a chair exercise program 3 days/week.    She denies any hx of stroke or TIA, does not seem  to have claudication sx's with walking.    Pt Diabetic: No Pt smoker: former smoker, quit in 2004, states she smoked x 40 years   She takes coumadin for atrial fib.    Past Medical History:  Diagnosis Date  . Atrial fibrillation ([place])   . Migraine headache   . Osteopenia    Past Surgical History:  Procedure Laterality Date  . ABDOMINAL AORTIC ENDOVASCULAR STENT GRAFT N/A 04/11/2013   Procedure: ABDOMINAL AORTIC ENDOVASCULAR STENT GRAFT WITH RIGHT FEMORAL PATCH ANGIOPLASTY;  Surgeon: Mal Misty, MD;  Location: [facility];  Service: Vascular;  Laterality: N/A;  . BREAST SURGERY     LEFT BREAST BIOPSY  . broken leg Left 1970s  . CARDIAC CATHETERIZATION    . CHOLECYSTECTOMY  1972  . COLONOSCOPY    . EYE SURGERY Bilateral   . Laser vein procedure    . REFRACTIVE SURGERY    . TONSILLECTOMY     Social History Social History   Tobacco Use  . Smoking status: Former Smoker    Types: Cigarettes    Last attempt to quit: 08/10/2002    Years since quitting: 14.7  . Smokeless tobacco: Never Used  . Tobacco comment: History of tobacco abuse  Substance Use Topics  . Alcohol use: Yes    Alcohol/week: 0.0 oz    Comment: rare  . Drug use: No   Family History Family History  Problem Relation Age of Onset  .  Cirrhosis Mother   . Cancer Mother 61       PANCREAS  . Heart defect Sister   . Breast cancer Sister        age 27  . Heart disease Sister   . Stroke Sister   . Alcohol abuse Father   . Depression Father   . Hypertension Brother   . Hyperlipidemia Son   . Heart disease Daughter    Current Outpatient Medications on File Prior to Visit  Medication Sig Dispense Refill  . acetaminophen (TYLENOL) 500 MG tablet Take 1,000 mg by mouth 2 (two) times daily as needed for moderate pain.    Marland Kitchen albuterol (PROVENTIL HFA;VENTOLIN HFA) 108 (90 Base) MCG/ACT inhaler Inhale 2 puffs into the lungs every 4 (four) hours as needed for shortness of breath. Inhale 2 puffs 5-10 min apart every  4 to 6 hrs- to rescue asthma 60 g 3  . budesonide (PULMICORT) 0.25 MG/2ML nebulizer solution Take 2 mLs (0.25 mg total) by nebulization 2 (two) times daily. 60 mL 12  . cetirizine (ZYRTEC) 10 MG tablet TAKE 1 CAPSULE (10 MG TOTAL) BY MOUTH AT BEDTIME. 90 tablet 3  . Cholecalciferol (VITAMIN D3) 5000 units CAPS Takes 2 caps (10,000 units) daily    . digoxin (LANOXIN) 0.125 MG tablet Take 1 tablet daily for Heart Rhythm 90 tablet 3  . diltiazem (TIAZAC) 120 MG 24 hr capsule TAKE 1 CAPSULE BY MOUTH EVERY DAY 90 capsule 1  . furosemide (LASIX) 80 MG tablet TAKE 1 TABLET BY MOUTH TWICE A DAY AS NEEDED FOR FLUID 180 tablet 1  . gabapentin (NEURONTIN) 600 MG tablet Takes 1/2 to 1 tablet 3 times daily. 270 tablet 1  . levothyroxine (SYNTHROID, LEVOTHROID) 50 MCG tablet TAKE 1 TABLET BY MOUTH EVERY DAY (Patient taking differently: Take 1.5 tablets on M,W,F and 1 tablet all other days) 90 tablet 1  . montelukast (SINGULAIR) 10 MG tablet TAKE 1 TABLET EVERY DAY FOR ALLERGIES 90 tablet 1  . pantoprazole (PROTONIX) 40 MG tablet Take 1 tablet (40 mg total) by mouth daily.    [name] Glycol-Propyl Glycol (SYSTANE OP) Apply to eye 4 (four) times daily as needed.    . potassium chloride SA (KLOR-CON M20) 20 MEQ tablet TAKE 2 TABLETS (40 MEQ TOTAL) BY MOUTH DAILY. 180 tablet 1  . pravastatin (PRAVACHOL) 40 MG tablet TAKE 1 TABLET BY MOUTH AT BEDTIME FOR CHOLESTEROL 90 tablet 1  . predniSONE (DELTASONE) 20 MG tablet 1 tab 3 x day for 3 days, then 1 tab 2 x day for 3 days, then 1 tab 1 x day for 5 days 20 tablet 0  . prochlorperazine (COMPAZINE) 5 MG tablet TAKE 1 TABLET BY MOUTH THREE TIMES A DAY AS NEEDED FOR VERTIGO OR NAUSEA 60 tablet 0  . promethazine-dextromethorphan (PROMETHAZINE-DM) 6.25-15 MG/5ML syrup Take 1 to 2 tsp enery 4 hours if needed for cough 360 mL 1  . triamcinolone cream (KENALOG) 0.1 % Apply 1 application topically 2 (two) times daily. Apply to rash  2 x / day 80 g 3  . warfarin (COUMADIN) 3  MG tablet Take 3 mg by mouth daily.    . prednisoLONE acetate (PRED FORTE) 1 % ophthalmic suspension INSTILL 1 DROP INTO BOTH EYES TWICE A DAY  1   No current facility-administered medications on file prior to visit.    Allergies  Allergen Reactions  . Amiodarone Other (See Comments)    PULMONARY TOXICITY  . Diovan [Valsartan] Other (See Comments)  HYPOTENSION  . Doxycycline Diarrhea and Other (See Comments)    VISUAL DISTURBANCE  . Flexeril [Cyclobenzaprine] Other (See Comments)    FATIGUE  . Keflex [Cephalexin] Diarrhea  . Verapamil Other (See Comments)    EDEMA  . Codeine Other (See Comments)    unknown     ROS: See HPI for pertinent positives and negatives.  Physical Examination  Vitals:   04/26/17 0925 04/26/17 0928  BP: (!) 151/85 (!) 149/90  Pulse: 91   Resp: 20   Temp: (!) 97.3 F (36.3 C)   TempSrc: Oral   SpO2: 96%   Weight: 201 lb (91.2 kg)   Height: 5' 3.5" (1.613 m)    Body mass index is 35.05 kg/m.  General: A&O x 3, WD, obese female Gait: Normal  HEENT: No gross abnormalities  Pulmonary: Sym exp, respirations are non labored, adequate air movement in all fields, CTAB, no rales, rhonchi, or wheezing. Cardiac: Irregular rhythm with controlled rate, no murmur appreciated  Vascular: Vessel Right Left  Radial 2+Palpable 2+Palpable  Carotid  without bruit  without bruit  Aorta slightly palpable N/A  Femoral 1+ Palpable 1+Palpable  Popliteal Not palpable Not palpable  PT Not Palpable Not Palpable  DP faintly Palpable 1-2+ Palpable   Gastrointestinal: soft, NTND, -G/R, - HSM, - palpable masses, - CVAT B. Musculoskeletal: M/S 5/5 in all extremities, extremities without ischemic changes.   Skin: No rash, no cellulitis, no ulcers noted.  Neurologic: Pain and light touch intact in extremities, Motor exam as listed above Psychiatric: Normal thought content, mood appropriate for clinical situation.    DATA  EVAR Duplex (Date: 04/26/17):  AAA  sac size: 2.8 cm; Right CIA: measurements not documented; Left CIA: 1.1 cm, all biphasic waveforms  no endoleak detected  Technically difficult exam due to excessive bowel gas and body habitus   Previous (04-20-16): 3.5 cm; Right CIA: 1.66 cm; Left CIA: 1.56 cm   ABI (Date: 04/26/2017):  R:   ABI: 1.18 (was 0.98 on 04-20-16),   PT: tri  DP: tri  TBI:  0.84 (was 0.67)  L:   ABI: 1.08 (was 0.89),   PT: waveform morphology not documented (was bi)  DP: waveform morphology not documented (was tri)   TBI: 0.79 (was 0.63)  Improvement in bilateral ABI and TBI to normal.    CTA Abd/Pelvis Duplex (Date: 04-22-2015):  AAA sac size: 2.6 cm x 3.2 cm  no endoleak detected   Medical Decision Making  Katie [name] is a 77 y.o. female who presents s/p EVAR (Date: 03-11-2014).  Pt is asymptomatic with a decrease in sac size to 2.8 cm today from 3.5 cm on 04-20-16, based on limited visualization.   I discussed with the patient the importance of surveillance of the endograft.  The next endograft duplex will be scheduled for 12 months.   No need to recheck ABI's since they are normal, unless clinical situation indicates.   The patient will follow up with [place] in 12 months with these studies.  I emphasized the importance of maximal medical management including strict control of blood pressure, blood glucose, and lipid levels, antiplatelet agents, obtaining regular exercise, and cessation of smoking.   Thank you for allowing [place] to participate in this patient's care.  Clemon Chambers, RN, MSN, FNP-C [facility] [place] Office: [phone]  Clinic Physician: Early  04/26/2017, 9:41 AM

## 2017-04-26 NOTE — Patient Instructions (Signed)
Before your next abdominal ultrasound:  Take two Extra-Strength Gas-X capsules at bedtime the night before the test. Take another two Extra-Strength Gas-X capsules 3 hours before the test.  Avoid gas forming foods the day before the test.       

## 2017-05-04 DIAGNOSIS — H01021 Squamous blepharitis right upper eyelid: Secondary | ICD-10-CM | POA: Diagnosis not present

## 2017-05-04 DIAGNOSIS — H01025 Squamous blepharitis left lower eyelid: Secondary | ICD-10-CM | POA: Diagnosis not present

## 2017-05-04 DIAGNOSIS — H01024 Squamous blepharitis left upper eyelid: Secondary | ICD-10-CM | POA: Diagnosis not present

## 2017-05-04 DIAGNOSIS — L719 Rosacea, unspecified: Secondary | ICD-10-CM | POA: Diagnosis not present

## 2017-05-04 DIAGNOSIS — H01022 Squamous blepharitis right lower eyelid: Secondary | ICD-10-CM | POA: Diagnosis not present

## 2017-05-07 ENCOUNTER — Inpatient Hospital Stay (HOSPITAL_COMMUNITY)
Admission: EM | Admit: 2017-05-07 | Discharge: 2017-05-10 | DRG: 871 | Disposition: A | Discharge status: Home Health | Payer: Medicare Other | Attending: Family Medicine | Admitting: Family Medicine

## 2017-05-07 ENCOUNTER — Encounter (HOSPITAL_COMMUNITY): Payer: Self-pay

## 2017-05-07 ENCOUNTER — Other Ambulatory Visit: Payer: Self-pay

## 2017-05-07 ENCOUNTER — Emergency Department (HOSPITAL_COMMUNITY): Payer: Medicare Other

## 2017-05-07 DIAGNOSIS — G9340 Encephalopathy, unspecified: Secondary | ICD-10-CM | POA: Diagnosis not present

## 2017-05-07 DIAGNOSIS — N183 Chronic kidney disease, stage 3 unspecified: Secondary | ICD-10-CM | POA: Diagnosis present

## 2017-05-07 DIAGNOSIS — G9341 Metabolic encephalopathy: Secondary | ICD-10-CM | POA: Diagnosis present

## 2017-05-07 DIAGNOSIS — I5032 Chronic diastolic (congestive) heart failure: Secondary | ICD-10-CM | POA: Diagnosis not present

## 2017-05-07 DIAGNOSIS — J8410 Pulmonary fibrosis, unspecified: Secondary | ICD-10-CM | POA: Diagnosis present

## 2017-05-07 DIAGNOSIS — E872 Acidosis: Secondary | ICD-10-CM | POA: Diagnosis not present

## 2017-05-07 DIAGNOSIS — I482 Chronic atrial fibrillation: Secondary | ICD-10-CM | POA: Diagnosis not present

## 2017-05-07 DIAGNOSIS — J9601 Acute respiratory failure with hypoxia: Secondary | ICD-10-CM | POA: Diagnosis not present

## 2017-05-07 DIAGNOSIS — R05 Cough: Secondary | ICD-10-CM | POA: Diagnosis not present

## 2017-05-07 DIAGNOSIS — R6521 Severe sepsis with septic shock: Secondary | ICD-10-CM | POA: Diagnosis present

## 2017-05-07 DIAGNOSIS — N179 Acute kidney failure, unspecified: Secondary | ICD-10-CM | POA: Diagnosis present

## 2017-05-07 DIAGNOSIS — J449 Chronic obstructive pulmonary disease, unspecified: Secondary | ICD-10-CM | POA: Diagnosis present

## 2017-05-07 DIAGNOSIS — Z7952 Long term (current) use of systemic steroids: Secondary | ICD-10-CM

## 2017-05-07 DIAGNOSIS — J189 Pneumonia, unspecified organism: Secondary | ICD-10-CM

## 2017-05-07 DIAGNOSIS — Z823 Family history of stroke: Secondary | ICD-10-CM | POA: Diagnosis not present

## 2017-05-07 DIAGNOSIS — Z7901 Long term (current) use of anticoagulants: Secondary | ICD-10-CM | POA: Diagnosis not present

## 2017-05-07 DIAGNOSIS — I10 Essential (primary) hypertension: Secondary | ICD-10-CM | POA: Diagnosis present

## 2017-05-07 DIAGNOSIS — A419 Sepsis, unspecified organism: Secondary | ICD-10-CM

## 2017-05-07 DIAGNOSIS — Z87891 Personal history of nicotine dependence: Secondary | ICD-10-CM

## 2017-05-07 DIAGNOSIS — I248 Other forms of acute ischemic heart disease: Secondary | ICD-10-CM | POA: Diagnosis not present

## 2017-05-07 DIAGNOSIS — E039 Hypothyroidism, unspecified: Secondary | ICD-10-CM | POA: Diagnosis not present

## 2017-05-07 DIAGNOSIS — I4891 Unspecified atrial fibrillation: Secondary | ICD-10-CM | POA: Diagnosis not present

## 2017-05-07 DIAGNOSIS — Z8249 Family history of ischemic heart disease and other diseases of the circulatory system: Secondary | ICD-10-CM

## 2017-05-07 DIAGNOSIS — R0902 Hypoxemia: Secondary | ICD-10-CM

## 2017-05-07 DIAGNOSIS — J45909 Unspecified asthma, uncomplicated: Secondary | ICD-10-CM

## 2017-05-07 DIAGNOSIS — Z803 Family history of malignant neoplasm of breast: Secondary | ICD-10-CM | POA: Diagnosis not present

## 2017-05-07 DIAGNOSIS — M8580 Other specified disorders of bone density and structure, unspecified site: Secondary | ICD-10-CM | POA: Diagnosis not present

## 2017-05-07 DIAGNOSIS — G43909 Migraine, unspecified, not intractable, without status migrainosus: Secondary | ICD-10-CM | POA: Diagnosis not present

## 2017-05-07 DIAGNOSIS — R0602 Shortness of breath: Secondary | ICD-10-CM | POA: Diagnosis not present

## 2017-05-07 DIAGNOSIS — I130 Hypertensive heart and chronic kidney disease with heart failure and stage 1 through stage 4 chronic kidney disease, or unspecified chronic kidney disease: Secondary | ICD-10-CM | POA: Diagnosis present

## 2017-05-07 DIAGNOSIS — J441 Chronic obstructive pulmonary disease with (acute) exacerbation: Secondary | ICD-10-CM | POA: Diagnosis present

## 2017-05-07 DIAGNOSIS — J101 Influenza due to other identified influenza virus with other respiratory manifestations: Secondary | ICD-10-CM | POA: Diagnosis not present

## 2017-05-07 DIAGNOSIS — J111 Influenza due to unidentified influenza virus with other respiratory manifestations: Secondary | ICD-10-CM | POA: Diagnosis present

## 2017-05-07 DIAGNOSIS — Z7982 Long term (current) use of aspirin: Secondary | ICD-10-CM | POA: Diagnosis not present

## 2017-05-07 DIAGNOSIS — A4189 Other specified sepsis: Secondary | ICD-10-CM | POA: Diagnosis not present

## 2017-05-07 DIAGNOSIS — I959 Hypotension, unspecified: Secondary | ICD-10-CM | POA: Diagnosis present

## 2017-05-07 DIAGNOSIS — R0682 Tachypnea, not elsewhere classified: Secondary | ICD-10-CM | POA: Diagnosis not present

## 2017-05-07 DIAGNOSIS — I361 Nonrheumatic tricuspid (valve) insufficiency: Secondary | ICD-10-CM | POA: Diagnosis not present

## 2017-05-07 LAB — CBC WITH DIFFERENTIAL/PLATELET
Basophils Absolute: 0 10*3/uL (ref 0.0–0.1)
Basophils Relative: 0 %
EOS ABS: 0.1 10*3/uL (ref 0.0–0.7)
EOS PCT: 0 %
HCT: 48.3 % — ABNORMAL HIGH (ref 36.0–46.0)
HEMOGLOBIN: 15.7 g/dL — AB (ref 12.0–15.0)
LYMPHS ABS: 2.4 10*3/uL (ref 0.7–4.0)
Lymphocytes Relative: 19 %
MCH: 30.5 pg (ref 26.0–34.0)
MCHC: 32.5 g/dL (ref 30.0–36.0)
MCV: 94 fL (ref 78.0–100.0)
Monocytes Absolute: 1.1 10*3/uL — ABNORMAL HIGH (ref 0.1–1.0)
Monocytes Relative: 9 %
Neutro Abs: 8.9 10*3/uL — ABNORMAL HIGH (ref 1.7–7.7)
Neutrophils Relative %: 72 %
PLATELETS: 185 10*3/uL (ref 150–400)
RBC: 5.14 MIL/uL — ABNORMAL HIGH (ref 3.87–5.11)
RDW: 16.8 % — ABNORMAL HIGH (ref 11.5–15.5)
WBC: 12.5 10*3/uL — ABNORMAL HIGH (ref 4.0–10.5)

## 2017-05-07 LAB — I-STAT TROPONIN, ED: Troponin i, poc: 0.42 ng/mL (ref 0.00–0.08)

## 2017-05-07 LAB — I-STAT CG4 LACTIC ACID, ED: LACTIC ACID, VENOUS: 3.23 mmol/L — AB (ref 0.5–1.9)

## 2017-05-07 LAB — BASIC METABOLIC PANEL
Anion gap: 14 (ref 5–15)
BUN: 22 mg/dL — AB (ref 6–20)
CHLORIDE: 99 mmol/L — AB (ref 101–111)
CO2: 26 mmol/L (ref 22–32)
CREATININE: 1.58 mg/dL — AB (ref 0.44–1.00)
Calcium: 9 mg/dL (ref 8.9–10.3)
GFR calc Af Amer: 36 mL/min — ABNORMAL LOW (ref >60)
GFR calc non Af Amer: 31 mL/min — ABNORMAL LOW (ref >60)
GLUCOSE: 152 mg/dL — AB (ref 65–99)
Potassium: 4 mmol/L (ref 3.5–5.1)
SODIUM: 139 mmol/L (ref 135–145)

## 2017-05-07 LAB — INFLUENZA PANEL BY PCR (TYPE A & B)
INFLAPCR: POSITIVE — AB
Influenza B By PCR: NEGATIVE

## 2017-05-07 MED ORDER — SODIUM CHLORIDE 0.9 % IV BOLUS (SEPSIS)
1000.0000 mL | Freq: Once | INTRAVENOUS | Status: AC
  Administered 2017-05-07: 1000 mL via INTRAVENOUS

## 2017-05-07 MED ORDER — DILTIAZEM HCL-DEXTROSE 100-5 MG/100ML-% IV SOLN (PREMIX)
5.0000 mg/h | INTRAVENOUS | Status: DC
  Administered 2017-05-07: 5 mg/h via INTRAVENOUS
  Filled 2017-05-07: qty 100

## 2017-05-07 MED ORDER — DILTIAZEM LOAD VIA INFUSION
20.0000 mg | Freq: Once | INTRAVENOUS | Status: DC
  Filled 2017-05-07: qty 20

## 2017-05-07 MED ORDER — NOREPINEPHRINE 4 MG/250ML-% IV SOLN
0.0000 ug/min | Freq: Once | INTRAVENOUS | Status: AC
  Administered 2017-05-08: 2 ug/min via INTRAVENOUS
  Filled 2017-05-07: qty 250

## 2017-05-07 MED ORDER — PIPERACILLIN-TAZOBACTAM 3.375 G IVPB 30 MIN
3.3750 g | Freq: Once | INTRAVENOUS | Status: AC
  Administered 2017-05-07: 3.375 g via INTRAVENOUS
  Filled 2017-05-07: qty 50

## 2017-05-07 MED ORDER — METHYLPREDNISOLONE SODIUM SUCC 125 MG IJ SOLR: 125.0000 mg | Freq: Once | INTRAMUSCULAR | Status: DC

## 2017-05-07 MED ORDER — ACETAMINOPHEN 500 MG PO TABS: 1000.0000 mg | ORAL_TABLET | Freq: Once | ORAL | Status: DC

## 2017-05-07 MED ORDER — VANCOMYCIN HCL 10 G IV SOLR
1500.0000 mg | Freq: Once | INTRAVENOUS | Status: AC
  Administered 2017-05-07: 1500 mg via INTRAVENOUS
  Filled 2017-05-07: qty 1500

## 2017-05-07 MED ORDER — NOREPINEPHRINE BITARTRATE 1 MG/ML IV SOLN: 0.0000 ug/min | Freq: Once | INTRAVENOUS | Status: DC

## 2017-05-07 MED ORDER — OSELTAMIVIR PHOSPHATE 75 MG PO CAPS
75.0000 mg | ORAL_CAPSULE | Freq: Once | ORAL | Status: AC
  Administered 2017-05-08: 75 mg via ORAL
  Filled 2017-05-07: qty 1

## 2017-05-07 NOTE — ED Triage Notes (Signed)
Pt BIB [facility] from home c/o SOB. Pt states that patient is altered and has been confused and lethargic since 9a. She is A&Ox2 in triage. MD at bedside.   Given en route: 15mg  albuterol 1mg  atrovent 125 mg Solumedrol 1000mg  tylenol

## 2017-05-07 NOTE — ED Notes (Signed)
Notified EDP,Floyd,MD.pt. I-stat troponin results 0.42 and RN,Jake made aware.

## 2017-05-07 NOTE — ED Provider Notes (Signed)
[facility] DEPT Provider Note   CSN: [id number] Arrival date & time: 05/07/17  2137     History   Chief Complaint Chief Complaint  Patient presents with  . Shortness of Breath    HPI Katie [name] is a 77 y.o. female.  77 yo F with a chief complaint of shortness of breath.  She has been coughing and congested for the past week.  Having some subjective fevers and chills.  Significantly worsening over the past 48 hours.  Per the family the patient had a change in mental status.  Worsening shortness of breath this evening.  Having some fevers as well.  Called 911.  Per EMS the patient had diffuse wheezes and so was given 4 duo nebs.  Had some improvement in her of her aeration as well as mental status but now has some persistent tachycardia.  The patient denied lower extremity edema.  Denies abdominal pain or chest pain. Found to have hypoxia.     The history is provided by the patient.  Illness  This is a new problem. The current episode started more than 2 days ago. The problem occurs constantly. The problem has not changed since onset.Associated symptoms include shortness of breath. Pertinent negatives include no chest pain and no headaches. Nothing relieves the symptoms. She has tried nothing for the symptoms. The treatment provided no relief.    Past Medical History:  Diagnosis Date  . Atrial fibrillation ([place])   . Migraine headache   . Osteopenia     Patient Active Problem List   Diagnosis Date Noted  . Gout 03/20/2017  . Chronic systolic CHF (congestive heart failure) ([place]) 07/19/2016  . Chest pain 07/07/2016  . Hyponatremia 07/07/2016  . Hypotension 02/03/2016  . Other abnormal glucose 09/19/2015  . AAA (abdominal aortic aneurysm) without rupture ([place]) 04/22/2015  . Morbid obesity due to excess calories ([place]) 08/03/2014  . PVD (peripheral vascular disease) with claudication ([place]) 10/16/2013  . CKD  stage III (GFR 51 ml/min) 06/08/2013    . Hyperlipidemia 06/08/2013  . Medication management 06/08/2013  . 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 Long term current use of anticoagulant therapy 04/23/2013  . Vitamin D deficiency 02/15/2013  . Hypothyroidism   . Osteopenia   . Congestive heart failure ([place]) 11/25/2008  . Migraine headache 11/22/2008  . Essential hypertension 11/22/2008  . Coronary atherosclerosis 11/22/2008  . Atrial fibrillation ([place]) 11/22/2008  . COPD (chronic obstructive pulmonary disease) with chronic bronchitis ([place]) 11/22/2008  . GERD 11/22/2008    Past Surgical History:  Procedure Laterality Date  . ABDOMINAL AORTIC ENDOVASCULAR STENT GRAFT N/A 04/11/2013   Procedure: ABDOMINAL AORTIC ENDOVASCULAR STENT GRAFT WITH RIGHT FEMORAL PATCH ANGIOPLASTY;  Surgeon: Mal Misty, MD;  Location: [facility];  Service: Vascular;  Laterality: N/A;  . BREAST SURGERY     LEFT BREAST BIOPSY  . broken leg Left 1970s  . CARDIAC CATHETERIZATION    . CHOLECYSTECTOMY  1972  . COLONOSCOPY    . EYE SURGERY Bilateral   . Laser vein procedure    . REFRACTIVE SURGERY    . TONSILLECTOMY      OB History    Gravida Para Term Preterm AB Living   4 3 3   1 3    SAB TAB Ectopic Multiple Live Births                   Home Medications    Prior to Admission medications   Medication  Sig Start Date End Date Taking? Authorizing Provider  acetaminophen (TYLENOL) 500 MG tablet Take 1,000 mg by mouth daily.    Yes [provider]  albuterol (PROVENTIL HFA;VENTOLIN HFA) 108 (90 Base) MCG/ACT inhaler Inhale 2 puffs into the lungs every 4 (four) hours as needed for shortness of breath. Inhale 2 puffs 5-10 min apart every 4 to 6 hrs- to rescue asthma Patient taking differently: Inhale 2 puffs into the lungs every 4 (four) hours as needed for shortness of breath. rescue asthma 12/09/16  Yes [name], MD  allopurinol (ZYLOPRIM) 300 MG tablet Take 300 mg by mouth daily.   Yes [provider]   aspirin EC 81 MG tablet Take 81 mg by mouth daily.   Yes [provider]  azithromycin (ZITHROMAX) 250 MG tablet Take 250 mg by mouth daily. 05/06/17  Yes [provider]  budesonide (PULMICORT) 0.25 MG/2ML nebulizer solution Take 2 mLs (0.25 mg total) by nebulization 2 (two) times daily. Patient taking differently: Take 0.25 mg by nebulization 2 (two) times daily as needed (wheezing).  07/16/16  Yes Ghimire, Henreitta Leber, MD  Cholecalciferol (VITAMIN D3) 5000 units CAPS Takes 2 caps (10,000 units) daily Patient taking differently: Take 5,000 Units by mouth daily.  01/09/17  Yes [name], MD  digoxin (LANOXIN) 0.125 MG tablet Take 1 tablet daily for Heart Rhythm Patient taking differently: Take 0.125 mg by mouth daily. Take 1 tablet daily for Heart Rhythm 04/26/17 04/26/18 Yes [name], MD  diltiazem [facility]) 120 MG 24 hr capsule TAKE 1 CAPSULE BY MOUTH EVERY DAY 12/06/16  Yes Vicie Mutters, PA-C  furosemide (LASIX) 80 MG tablet TAKE 1 TABLET BY MOUTH TWICE A DAY AS NEEDED FOR FLUID Patient taking differently: Take 80 mg by mouth daily. TAKE 1 TABLET BY MOUTH TWICE A DAY AS NEEDED FOR FLUI 01/19/17  Yes [name], MD  gabapentin (NEURONTIN) 600 MG tablet Takes 1/2 to 1 tablet 3 times daily. Patient taking differently: Take 600 mg by mouth 3 (three) times daily.  11/04/16  Yes [name], MD  levothyroxine (SYNTHROID, LEVOTHROID) 50 MCG tablet TAKE 1 TABLET BY MOUTH EVERY DAY Patient taking differently: Take 1.5 tablets on all days 01/30/17  Yes [name], MD  montelukast (SINGULAIR) 10 MG tablet TAKE 1 TABLET EVERY DAY FOR ALLERGIES 12/28/16  Yes [name], MD  Olopatadine HCl 0.2 % SOLN Place 1 drop into both eyes daily.    Yes [provider]  pantoprazole (PROTONIX) 40 MG tablet Take 1 tablet (40 mg total) by mouth daily. 07/16/16  Yes Ghimire, Henreitta Leber, MD  Polyethyl Glycol-Propyl Glycol (SYSTANE OP) Apply 1 drop to eye 4 (four)  times daily.    Yes [provider]  potassium chloride SA (KLOR-CON M20) 20 MEQ tablet TAKE 2 TABLETS (40 MEQ TOTAL) BY MOUTH DAILY. Patient taking differently: Take 40 mEq by mouth daily.  04/11/17  Yes Vicie Mutters, PA-C  pravastatin (PRAVACHOL) 40 MG tablet TAKE 1 TABLET BY MOUTH AT BEDTIME FOR CHOLESTEROL 06/30/16  Yes [name], MD  prednisoLONE acetate (PRED FORTE) 1 % ophthalmic suspension INSTILL 1 DROP INTO BOTH EYES FOUR TIMES A DAY FOR 10 DAYS AND THEN TWICE DAY. 04/27/17  Yes [provider]  predniSONE (DELTASONE) 20 MG tablet 1 tab 3 x day for 3 days, then 1 tab 2 x day for 3 days, then 1 tab 1 x day for 5 days Patient taking differently: Take 20 mg by mouth daily with breakfast. 1 tab 3  x day for 3 days, then 1 tab 2 x day for 3 days, then 1 tab 1 x day for 5 days 04/22/17  Yes [name], MD  prochlorperazine (COMPAZINE) 5 MG tablet TAKE 1 TABLET BY MOUTH THREE TIMES A DAY AS NEEDED FOR VERTIGO OR NAUSEA 02/21/17  Yes [name], MD  promethazine-dextromethorphan (PROMETHAZINE-DM) 6.25-15 MG/5ML syrup Take 1 to 2 tsp enery 4 hours if needed for cough Patient taking differently: Take 5 mLs by mouth every 4 (four) hours as needed for cough.  04/22/17  Yes [name], MD  triamcinolone cream (KENALOG) 0.1 % Apply 1 application topically 2 (two) times daily. Apply to rash  2 x / day Patient taking differently: Apply 1 application topically 2 (two) times daily as needed. Rash 10/12/16  Yes [name], MD  warfarin (COUMADIN) 3 MG tablet Take 4.5 mg by mouth daily.    Yes [provider]  cetirizine (ZYRTEC) 10 MG tablet TAKE 1 CAPSULE (10 MG TOTAL) BY MOUTH AT BEDTIME. Patient not taking: Reported on 05/07/2017 05/02/16   [name], MD    Family History Family History  Problem Relation Age of Onset  . Cirrhosis Mother   . Cancer Mother 42       PANCREAS  . Heart defect Sister   . Breast cancer Sister        age 65  .  Heart disease Sister   . Stroke Sister   . Alcohol abuse Father   . Depression Father   . Hypertension Brother   . Hyperlipidemia Son   . Heart disease Daughter     Social History Social History   Tobacco Use  . Smoking status: Former Smoker    Types: Cigarettes    Last attempt to quit: 08/10/2002    Years since quitting: 14.7  . Smokeless tobacco: Never Used  . Tobacco comment: History of tobacco abuse  Substance Use Topics  . Alcohol use: Yes    Alcohol/week: 0.0 oz    Comment: rare  . Drug use: No     Allergies   Amiodarone; Diovan [valsartan]; Doxycycline; Flexeril [cyclobenzaprine]; Keflex [cephalexin]; Verapamil; and Codeine   Review of Systems Review of Systems  Constitutional: Positive for fever. Negative for chills.  HENT: Negative for congestion and rhinorrhea.   Eyes: Negative for redness and visual disturbance.  Respiratory: Positive for cough and shortness of breath. Negative for wheezing.   Cardiovascular: Negative for chest pain and palpitations.  Gastrointestinal: Negative for nausea and vomiting.  Genitourinary: Negative for dysuria and urgency.  Musculoskeletal: Negative for arthralgias and myalgias.  Skin: Negative for pallor and wound.  Neurological: Negative for dizziness and headaches.     Physical Exam Updated Vital Signs BP 92/65 (BP Location: Left Arm)   Pulse (!) 148   Temp (!) 102.3 F (39.1 C) (Rectal)   Resp (!) 25   Ht 5\' 3"  (1.6 m)   Wt 90.7 kg (200 lb)   SpO2 90%   BMI 35.43 kg/m   Physical Exam  Constitutional: She is oriented to person, place, and time. She appears well-developed and well-nourished. No distress.  HENT:  Head: Normocephalic and atraumatic.  Eyes: EOM are normal. Pupils are equal, round, and reactive to light.  Neck: Normal range of motion. Neck supple.  Cardiovascular: Normal rate and regular rhythm. Exam reveals no gallop and no friction rub.  No murmur heard. Pulmonary/Chest: Effort normal. Tachypnea  noted. She has wheezes (diffuse). She has no rales.  Abdominal: Soft. She  exhibits no distension. There is no tenderness.  Musculoskeletal: She exhibits no edema or tenderness.  Neurological: She is alert and oriented to person, place, and time.  Skin: Skin is warm and dry. She is not diaphoretic.  Psychiatric: She has a normal mood and affect. Her behavior is normal.  Nursing note and vitals reviewed.    ED Treatments / Results  Labs (all labs ordered are listed, but only abnormal results are displayed) Labs Reviewed  CBC WITH DIFFERENTIAL/PLATELET - Abnormal; Notable for the following components:      Result Value   WBC 12.5 (*)    RBC 5.14 (*)    Hemoglobin 15.7 (*)    HCT 48.3 (*)    RDW 16.8 (*)    Neutro Abs 8.9 (*)    Monocytes Absolute 1.1 (*)    All other components within normal limits  BASIC METABOLIC PANEL - Abnormal; Notable for the following components:   Chloride 99 (*)    Glucose, Bld 152 (*)    BUN 22 (*)    Creatinine, Ser 1.58 (*)    GFR calc non Af Amer 31 (*)    GFR calc Af Amer 36 (*)    All other components within normal limits  INFLUENZA PANEL BY PCR (TYPE A & B) - Abnormal; Notable for the following components:   Influenza A By PCR POSITIVE (*)    All other components within normal limits  I-STAT TROPONIN, ED - Abnormal; Notable for the following components:   Troponin i, poc 0.42 (*)    All other components within normal limits  I-STAT CG4 LACTIC ACID, ED - Abnormal; Notable for the following components:   Lactic Acid, Venous 3.23 (*)    All other components within normal limits  CULTURE, BLOOD (ROUTINE X 2)  CULTURE, BLOOD (ROUTINE X 2)  URINALYSIS, ROUTINE W REFLEX MICROSCOPIC  TROPONIN I  TROPONIN I  TROPONIN I  I-STAT CG4 LACTIC ACID, ED    EKG  EKG Interpretation  Date/Time:  Saturday May 07 2017 21:55:27 EST Ventricular Rate:  148 PR Interval:    QRS Duration: 132 QT Interval:  327 QTC Calculation: 514 R Axis:   -87 Text  Interpretation:  Wide-QRS tachycardia Left bundle branch block st changes likely rate related. Otherwise no significant change Confirmed by Deno Etienne [phone]) on 05/07/2017 10:07:55 PM       Radiology Dg Chest Portable 1 View  Result Date: 05/07/2017 CLINICAL DATA:  Shortness of breath, lethargy. EXAM: PORTABLE CHEST 1 VIEW COMPARISON:  Chest radiograph Jul 14, 2016 FINDINGS: Cardiac silhouette is moderate to severely enlarged and unchanged. Calcified aortic knob. Interstitial prominence, decreased from prior examination without pleural effusion or focal consolidation. No pneumothorax. Soft tissue planes and included osseous structures are non suspicious. IMPRESSION: Moderate to severe cardiomegaly. Interstitial prominence most compatible with pulmonary edema. No focal consolidation. Electronically Signed   By: Elon Alas M.D.   On: 05/07/2017 22:19    Procedures Procedures (including critical care time)   EMERGENCY DEPARTMENT [place] CARDIAC EXAM "Study: Limited Ultrasound of the Heart and Pericardium"  INDICATIONS:Abnormal vital signs and Dyspnea Multiple views of the heart and pericardium were obtained in real-time with a multi-frequency probe.  PERFORMED HY:WVPXTG IMAGES ARCHIVED?: Yes LIMITATIONS:  Body habitus and pulmonary fibrosis VIEWS USED: Subcostal 4 chamber, Parasternal long axis, Apical 4 chamber  and Inferior Vena Cava INTERPRETATION: Cardiac activity present, Pericardial effusion present, Amount of pericardial effusion trace, Cardiac tamponade absent, Increased contractility and IVC dilated  Medications Ordered in ED Medications  diltiazem (CARDIZEM) 100 mg in dextrose 5% 139mL (1 mg/mL) infusion (5 mg/hr Intravenous Rate/Dose Change 05/07/17 2345)  vancomycin (VANCOCIN) 1,500 mg in sodium chloride 0.9 % 500 mL IVPB (1,500 mg Intravenous New Bag/Given 05/07/17 2309)  norepinephrine (LEVOPHED) 4mg  in D5W 239mL premix infusion (not administered)  oseltamivir (TAMIFLU)  capsule 75 mg (not administered)  sodium chloride 0.9 % bolus 1,000 mL (1,000 mLs Intravenous New Bag/Given 05/07/17 2220)  sodium chloride 0.9 % bolus 1,000 mL (0 mLs Intravenous Stopped 05/07/17 2245)  piperacillin-tazobactam (ZOSYN) IVPB 3.375 g (0 g Intravenous Stopped 05/07/17 2302)  sodium chloride 0.9 % bolus 1,000 mL (1,000 mLs Intravenous New Bag/Given 05/07/17 2245)     Initial Impression / Assessment and Plan / ED Course  I have reviewed the triage vital signs and the nursing notes.  Pertinent labs & imaging results that were available during my care of the patient were reviewed by me and considered in my medical decision making (see chart for details).  Clinical Course as of May 08 2351  Sat May 07, 2017  2234 Xray viewed by me, concerning for LLL pna with obscuration of the left heart border.  Rads with read more likely edema but no rales, no jvd, patient clinically dry.   [DF]    Clinical Course User Index [DF] Deno Etienne, DO    77 yo F with a chief complaint of shortness of breath.  This been going on for the past week and worsening this evening.  Hypoxic into the 70's.  Improved with O2.  Febrile and tachycardic.  Blood pressure is in the 16X systolic code sepsis initiated. Has recently been on azithro this week.  Will broaden abx coverage.  Cx.  Will give 2L up front and reassess with patient with hx of heart failure.    Reassessed without rales, continues to improve will give 3L of fluids.  HR improved on dilt now in the low 100's.  Will discuss with hospitalist.   Patient had worsening of BP.  Bedside US with IVC dilation.  Will start on dilt.  Discussed with critical care will come to see.   CRITICAL CARE Performed by: Cecilio Asper   Total critical care time: 80 minutes  Critical care time was exclusive of separately billable procedures and treating other patients.  Critical care was necessary to treat or prevent imminent or life-threatening  deterioration.  Critical care was time spent personally by me on the following activities: development of treatment plan with patient and/or surrogate as well as nursing, discussions with consultants, evaluation of patient's response to treatment, examination of patient, obtaining history from patient or surrogate, ordering and performing treatments and interventions, ordering and review of laboratory studies, ordering and review of radiographic studies, pulse oximetry and re-evaluation of patient's condition.  The patients results and plan were reviewed and discussed.   Any x-rays performed were independently reviewed by myself.   Differential diagnosis were considered with the presenting HPI.    Medications  diltiazem (CARDIZEM) 100 mg in dextrose 5% 131mL (1 mg/mL) infusion (5 mg/hr Intravenous Rate/Dose Change 05/07/17 2345)  vancomycin (VANCOCIN) 1,500 mg in sodium chloride 0.9 % 500 mL IVPB (1,500 mg Intravenous New Bag/Given 05/07/17 2309)  norepinephrine (LEVOPHED) 4mg  in D5W 245mL premix infusion (not administered)  oseltamivir (TAMIFLU) capsule 75 mg (not administered)  sodium chloride 0.9 % bolus 1,000 mL (1,000 mLs Intravenous New Bag/Given 05/07/17 2220)  sodium chloride 0.9 % bolus 1,000 mL (0 mLs  Intravenous Stopped 05/07/17 2245)  piperacillin-tazobactam (ZOSYN) IVPB 3.375 g (0 g Intravenous Stopped 05/07/17 2302)  sodium chloride 0.9 % bolus 1,000 mL (1,000 mLs Intravenous New Bag/Given 05/07/17 2245)    Vitals:   05/07/17 2149 05/07/17 2151 05/07/17 2223  BP: (!) 87/65  92/65  Pulse: 62  (!) 148  Resp: (!) 22  (!) 25  Temp: 98.5 F (36.9 C) (!) 102.3 F (39.1 C)   TempSrc: Oral Rectal   SpO2: 98%  90%  Weight:   90.7 kg (200 lb)  Height:   5\' 3"  (1.6 m)    Final diagnoses:  HCAP (healthcare-associated pneumonia)  Septic shock ([place])    Admission/ observation were discussed with the admitting physician, patient and/or family and they are comfortable with the plan.     Final Clinical Impressions(s) / ED Diagnoses   Final diagnoses:  HCAP (healthcare-associated pneumonia)  Septic shock [facility])    ED Discharge Orders    None       Deno Etienne, DO 05/07/17 2353

## 2017-05-07 NOTE — ED Notes (Signed)
Bed: RESB Expected date:  Expected time:  Means of arrival:  Comments: 76yo F/ Resp distress

## 2017-05-07 NOTE — ED Notes (Signed)
Dr.Floyd notified of blood pressure of 77/44 and lactic acid. Dr.Floyd at bedside.

## 2017-05-08 ENCOUNTER — Inpatient Hospital Stay (HOSPITAL_COMMUNITY): Payer: Medicare Other

## 2017-05-08 DIAGNOSIS — N179 Acute kidney failure, unspecified: Secondary | ICD-10-CM | POA: Diagnosis not present

## 2017-05-08 DIAGNOSIS — J449 Chronic obstructive pulmonary disease, unspecified: Secondary | ICD-10-CM | POA: Diagnosis not present

## 2017-05-08 DIAGNOSIS — G9340 Encephalopathy, unspecified: Secondary | ICD-10-CM | POA: Diagnosis not present

## 2017-05-08 DIAGNOSIS — R6521 Severe sepsis with septic shock: Secondary | ICD-10-CM | POA: Diagnosis not present

## 2017-05-08 DIAGNOSIS — I959 Hypotension, unspecified: Secondary | ICD-10-CM | POA: Diagnosis present

## 2017-05-08 DIAGNOSIS — I5032 Chronic diastolic (congestive) heart failure: Secondary | ICD-10-CM | POA: Diagnosis present

## 2017-05-08 DIAGNOSIS — M8580 Other specified disorders of bone density and structure, unspecified site: Secondary | ICD-10-CM | POA: Diagnosis present

## 2017-05-08 DIAGNOSIS — A419 Sepsis, unspecified organism: Secondary | ICD-10-CM | POA: Diagnosis not present

## 2017-05-08 DIAGNOSIS — Z87891 Personal history of nicotine dependence: Secondary | ICD-10-CM | POA: Diagnosis not present

## 2017-05-08 DIAGNOSIS — J441 Chronic obstructive pulmonary disease with (acute) exacerbation: Secondary | ICD-10-CM | POA: Diagnosis not present

## 2017-05-08 DIAGNOSIS — J9601 Acute respiratory failure with hypoxia: Secondary | ICD-10-CM | POA: Diagnosis not present

## 2017-05-08 DIAGNOSIS — G9341 Metabolic encephalopathy: Secondary | ICD-10-CM | POA: Diagnosis not present

## 2017-05-08 DIAGNOSIS — Z7982 Long term (current) use of aspirin: Secondary | ICD-10-CM | POA: Diagnosis not present

## 2017-05-08 DIAGNOSIS — I4891 Unspecified atrial fibrillation: Secondary | ICD-10-CM | POA: Diagnosis not present

## 2017-05-08 DIAGNOSIS — Z823 Family history of stroke: Secondary | ICD-10-CM | POA: Diagnosis not present

## 2017-05-08 DIAGNOSIS — J101 Influenza due to other identified influenza virus with other respiratory manifestations: Secondary | ICD-10-CM | POA: Diagnosis not present

## 2017-05-08 DIAGNOSIS — J111 Influenza due to unidentified influenza virus with other respiratory manifestations: Secondary | ICD-10-CM | POA: Diagnosis present

## 2017-05-08 DIAGNOSIS — I361 Nonrheumatic tricuspid (valve) insufficiency: Secondary | ICD-10-CM | POA: Diagnosis not present

## 2017-05-08 DIAGNOSIS — Z7952 Long term (current) use of systemic steroids: Secondary | ICD-10-CM | POA: Diagnosis not present

## 2017-05-08 DIAGNOSIS — Z7901 Long term (current) use of anticoagulants: Secondary | ICD-10-CM | POA: Diagnosis not present

## 2017-05-08 DIAGNOSIS — I248 Other forms of acute ischemic heart disease: Secondary | ICD-10-CM | POA: Diagnosis present

## 2017-05-08 DIAGNOSIS — G43909 Migraine, unspecified, not intractable, without status migrainosus: Secondary | ICD-10-CM | POA: Diagnosis present

## 2017-05-08 DIAGNOSIS — E872 Acidosis: Secondary | ICD-10-CM | POA: Diagnosis present

## 2017-05-08 DIAGNOSIS — N183 Chronic kidney disease, stage 3 (moderate): Secondary | ICD-10-CM | POA: Diagnosis not present

## 2017-05-08 DIAGNOSIS — I482 Chronic atrial fibrillation: Secondary | ICD-10-CM | POA: Diagnosis not present

## 2017-05-08 DIAGNOSIS — Z8249 Family history of ischemic heart disease and other diseases of the circulatory system: Secondary | ICD-10-CM | POA: Diagnosis not present

## 2017-05-08 DIAGNOSIS — Z803 Family history of malignant neoplasm of breast: Secondary | ICD-10-CM | POA: Diagnosis not present

## 2017-05-08 DIAGNOSIS — J189 Pneumonia, unspecified organism: Secondary | ICD-10-CM | POA: Diagnosis present

## 2017-05-08 DIAGNOSIS — I130 Hypertensive heart and chronic kidney disease with heart failure and stage 1 through stage 4 chronic kidney disease, or unspecified chronic kidney disease: Secondary | ICD-10-CM | POA: Diagnosis present

## 2017-05-08 DIAGNOSIS — E039 Hypothyroidism, unspecified: Secondary | ICD-10-CM | POA: Diagnosis present

## 2017-05-08 DIAGNOSIS — A4189 Other specified sepsis: Secondary | ICD-10-CM | POA: Diagnosis not present

## 2017-05-08 DIAGNOSIS — I10 Essential (primary) hypertension: Secondary | ICD-10-CM | POA: Diagnosis not present

## 2017-05-08 LAB — MAGNESIUM
Magnesium: 2 mg/dL (ref 1.7–2.4)
Magnesium: 2.1 mg/dL (ref 1.7–2.4)

## 2017-05-08 LAB — CBC
HEMATOCRIT: 43.4 % (ref 36.0–46.0)
HEMOGLOBIN: 13.9 g/dL (ref 12.0–15.0)
MCH: 30.3 pg (ref 26.0–34.0)
MCHC: 32 g/dL (ref 30.0–36.0)
MCV: 94.6 fL (ref 78.0–100.0)
Platelets: 170 10*3/uL (ref 150–400)
RBC: 4.59 MIL/uL (ref 3.87–5.11)
RDW: 16.8 % — AB (ref 11.5–15.5)
WBC: 7.7 10*3/uL (ref 4.0–10.5)

## 2017-05-08 LAB — BASIC METABOLIC PANEL
Anion gap: 13 (ref 5–15)
BUN: 20 mg/dL (ref 6–20)
CHLORIDE: 103 mmol/L (ref 101–111)
CO2: 21 mmol/L — AB (ref 22–32)
Calcium: 7.8 mg/dL — ABNORMAL LOW (ref 8.9–10.3)
Creatinine, Ser: 1.45 mg/dL — ABNORMAL HIGH (ref 0.44–1.00)
GFR calc non Af Amer: 34 mL/min — ABNORMAL LOW (ref >60)
GFR, EST AFRICAN AMERICAN: 40 mL/min — AB (ref >60)
GLUCOSE: 231 mg/dL — AB (ref 65–99)
Potassium: 3.9 mmol/L (ref 3.5–5.1)
Sodium: 137 mmol/L (ref 135–145)

## 2017-05-08 LAB — BLOOD GAS, ARTERIAL
Acid-base deficit: 4.6 mmol/L — ABNORMAL HIGH (ref 0.0–2.0)
BICARBONATE: 20.3 mmol/L (ref 20.0–28.0)
DRAWN BY: 30860
O2 Content: 4 L/min
O2 Saturation: 96.5 %
PATIENT TEMPERATURE: 102.3
pCO2 arterial: 42.9 mmHg (ref 32.0–48.0)
pH, Arterial: 7.309 — ABNORMAL LOW (ref 7.350–7.450)
pO2, Arterial: 108 mmHg (ref 83.0–108.0)

## 2017-05-08 LAB — PHOSPHORUS
PHOSPHORUS: 2.3 mg/dL — AB (ref 2.5–4.6)
Phosphorus: 3.2 mg/dL (ref 2.5–4.6)

## 2017-05-08 LAB — URINALYSIS, ROUTINE W REFLEX MICROSCOPIC
Bilirubin Urine: NEGATIVE
Glucose, UA: NEGATIVE mg/dL
Hgb urine dipstick: NEGATIVE
KETONES UR: NEGATIVE mg/dL
LEUKOCYTES UA: NEGATIVE
NITRITE: NEGATIVE
Protein, ur: NEGATIVE mg/dL
SPECIFIC GRAVITY, URINE: 1.018 (ref 1.005–1.030)
pH: 7 (ref 5.0–8.0)

## 2017-05-08 LAB — LACTIC ACID, PLASMA
LACTIC ACID, VENOUS: 3.4 mmol/L — AB (ref 0.5–1.9)
LACTIC ACID, VENOUS: 2.7 mmol/L — AB (ref 0.5–1.9)
Lactic Acid, Venous: 5.3 mmol/L (ref 0.5–1.9)

## 2017-05-08 LAB — MRSA PCR SCREENING: MRSA by PCR: NEGATIVE

## 2017-05-08 LAB — PROTIME-INR
INR: 2.78
PROTHROMBIN TIME: 29.1 s — AB (ref 11.4–15.2)

## 2017-05-08 LAB — TROPONIN I
Troponin I: 0.76 ng/mL (ref <0.03)
Troponin I: 0.89 ng/mL (ref <0.03)
Troponin I: 0.76 ng/mL (ref <0.03)

## 2017-05-08 LAB — ECHOCARDIOGRAM COMPLETE
HEIGHTINCHES: 63 in
Weight: 3329.83 oz

## 2017-05-08 LAB — CORTISOL: Cortisol, Plasma: 13.3 ug/dL

## 2017-05-08 LAB — I-STAT CG4 LACTIC ACID, ED: Lactic Acid, Venous: 5.75 mmol/L (ref 0.5–1.9)

## 2017-05-08 LAB — PROCALCITONIN: Procalcitonin: 0.35 ng/mL

## 2017-05-08 MED ORDER — VANCOMYCIN HCL IN DEXTROSE 1-5 GM/200ML-% IV SOLN
1000.0000 mg | INTRAVENOUS | Status: DC
  Administered 2017-05-08: 1000 mg via INTRAVENOUS
  Filled 2017-05-08: qty 200

## 2017-05-08 MED ORDER — VANCOMYCIN HCL IN DEXTROSE 1-5 GM/200ML-% IV SOLN: 1000.0000 mg | INTRAVENOUS | Status: DC

## 2017-05-08 MED ORDER — ONDANSETRON HCL 4 MG/2ML IJ SOLN: 4.0000 mg | Freq: Four times a day (QID) | INTRAMUSCULAR | Status: DC | PRN

## 2017-05-08 MED ORDER — ACETAMINOPHEN 650 MG RE SUPP: 650.0000 mg | RECTAL | Status: DC | PRN

## 2017-05-08 MED ORDER — NOREPINEPHRINE 4 MG/250ML-% IV SOLN
0.0000 ug/min | INTRAVENOUS | Status: DC
  Administered 2017-05-08: 2 ug/min via INTRAVENOUS
  Filled 2017-05-08: qty 250

## 2017-05-08 MED ORDER — ARFORMOTEROL TARTRATE 15 MCG/2ML IN NEBU: 15.0000 ug | INHALATION_SOLUTION | Freq: Two times a day (BID) | RESPIRATORY_TRACT | Status: DC

## 2017-05-08 MED ORDER — ZOLPIDEM TARTRATE 5 MG PO TABS
5.0000 mg | ORAL_TABLET | Freq: Once | ORAL | Status: AC
  Administered 2017-05-08: 5 mg via ORAL
  Filled 2017-05-08: qty 1

## 2017-05-08 MED ORDER — OLOPATADINE HCL 0.1 % OP SOLN
1.0000 [drp] | Freq: Two times a day (BID) | OPHTHALMIC | Status: DC
  Administered 2017-05-08 – 2017-05-10 (×4): 1 [drp] via OPHTHALMIC
  Filled 2017-05-08: qty 5

## 2017-05-08 MED ORDER — BUDESONIDE 0.5 MG/2ML IN SUSP
0.5000 mg | Freq: Two times a day (BID) | RESPIRATORY_TRACT | Status: DC
  Administered 2017-05-08 – 2017-05-10 (×5): 0.5 mg via RESPIRATORY_TRACT
  Filled 2017-05-08 (×5): qty 2

## 2017-05-08 MED ORDER — IPRATROPIUM-ALBUTEROL 0.5-2.5 (3) MG/3ML IN SOLN
3.0000 mL | Freq: Four times a day (QID) | RESPIRATORY_TRACT | Status: DC
  Administered 2017-05-08 – 2017-05-10 (×11): 3 mL via RESPIRATORY_TRACT
  Filled 2017-05-08 (×11): qty 3

## 2017-05-08 MED ORDER — POLYVINYL ALCOHOL 1.4 % OP SOLN
1.0000 [drp] | Freq: Four times a day (QID) | OPHTHALMIC | Status: DC
  Administered 2017-05-08 – 2017-05-10 (×8): 1 [drp] via OPHTHALMIC
  Filled 2017-05-08: qty 15

## 2017-05-08 MED ORDER — IPRATROPIUM-ALBUTEROL 0.5-2.5 (3) MG/3ML IN SOLN: 3.0000 mL | Freq: Four times a day (QID) | RESPIRATORY_TRACT | Status: DC

## 2017-05-08 MED ORDER — OSELTAMIVIR PHOSPHATE 30 MG PO CAPS
30.0000 mg | ORAL_CAPSULE | Freq: Two times a day (BID) | ORAL | Status: DC
  Administered 2017-05-08 – 2017-05-10 (×5): 30 mg via ORAL
  Filled 2017-05-08 (×6): qty 1

## 2017-05-08 MED ORDER — SODIUM CHLORIDE 0.9 % IV SOLN: 250.0000 mL | INTRAVENOUS | Status: DC | PRN

## 2017-05-08 MED ORDER — PIPERACILLIN-TAZOBACTAM 3.375 G IVPB
3.3750 g | Freq: Three times a day (TID) | INTRAVENOUS | Status: DC
  Administered 2017-05-08 – 2017-05-09 (×4): 3.375 g via INTRAVENOUS
  Filled 2017-05-08 (×4): qty 50

## 2017-05-08 MED ORDER — BUDESONIDE 0.5 MG/2ML IN SUSP: 0.5000 mg | Freq: Two times a day (BID) | RESPIRATORY_TRACT | Status: DC

## 2017-05-08 MED ORDER — WARFARIN SODIUM 2.5 MG PO TABS
4.5000 mg | ORAL_TABLET | Freq: Once | ORAL | Status: AC
  Administered 2017-05-08: 16:00:00 4.5 mg via ORAL
  Filled 2017-05-08: qty 1

## 2017-05-08 MED ORDER — ACETAMINOPHEN 325 MG PO TABS
650.0000 mg | ORAL_TABLET | ORAL | Status: DC | PRN
  Administered 2017-05-09: 650 mg via ORAL
  Filled 2017-05-08: qty 2

## 2017-05-08 MED ORDER — WARFARIN - PHARMACIST DOSING INPATIENT
Freq: Every day | Status: DC
  Administered 2017-05-08: 18:00:00

## 2017-05-08 MED ORDER — ARFORMOTEROL TARTRATE 15 MCG/2ML IN NEBU
15.0000 ug | INHALATION_SOLUTION | Freq: Two times a day (BID) | RESPIRATORY_TRACT | Status: DC
  Administered 2017-05-08 – 2017-05-10 (×5): 15 ug via RESPIRATORY_TRACT
  Filled 2017-05-08 (×5): qty 2

## 2017-05-08 MED ORDER — GUAIFENESIN-DM 100-10 MG/5ML PO SYRP
10.0000 mL | ORAL_SOLUTION | ORAL | Status: DC | PRN
  Administered 2017-05-08 – 2017-05-10 (×4): 10 mL via ORAL
  Filled 2017-05-08 (×4): qty 10

## 2017-05-08 MED ORDER — METHYLPREDNISOLONE SODIUM SUCC 125 MG IJ SOLR
60.0000 mg | Freq: Four times a day (QID) | INTRAMUSCULAR | Status: DC
  Administered 2017-05-08 (×2): 60 mg via INTRAVENOUS
  Filled 2017-05-08 (×2): qty 2

## 2017-05-08 MED ORDER — DILTIAZEM HCL 30 MG PO TABS
30.0000 mg | ORAL_TABLET | Freq: Two times a day (BID) | ORAL | Status: DC
Start: 2017-05-08 — End: 2017-05-09
  Administered 2017-05-08 (×2): 30 mg via ORAL
  Filled 2017-05-08 (×2): qty 1

## 2017-05-08 MED ORDER — PREDNISOLONE ACETATE 1 % OP SUSP
1.0000 [drp] | Freq: Two times a day (BID) | OPHTHALMIC | Status: DC
  Administered 2017-05-08 – 2017-05-10 (×4): 1 [drp] via OPHTHALMIC
  Filled 2017-05-08: qty 5

## 2017-05-08 MED ORDER — ALBUTEROL SULFATE (2.5 MG/3ML) 0.083% IN NEBU: 2.5000 mg | INHALATION_SOLUTION | RESPIRATORY_TRACT | Status: DC | PRN

## 2017-05-08 MED ORDER — SODIUM CHLORIDE 0.9 % IV SOLN
INTRAVENOUS | Status: DC
  Administered 2017-05-08 (×2): via INTRAVENOUS

## 2017-05-08 NOTE — Progress Notes (Signed)
Pharmacy Antibiotic Note  Katie [name] is a 77 y.o. female admitted on 05/07/2017 with sepsis.  Pharmacy has been consulted for zosyn, vancomycin, adjust Tamiflu dosing.  Plan:  Zosyn 3.375g IV q8h (4 hour infusion).   Vancomycin 1500 mg x1 then maintenance dose of 1000 mg IV q24h  Tamiflu 75 mg x1 ED then 30 mg bid  Daily scr  F/u cultures/levels  Height: 5\' 3"  (160 cm) Weight: 208 lb 1.8 oz (94.4 kg) IBW/kg (Calculated) : 52.4  Temp (24hrs), Avg:99.1 F (37.3 C), Min:97.5 F (36.4 C), Max:102.3 F (39.1 C)  Recent Labs  Lab 05/07/17 2155 05/07/17 2208 05/08/17 0215 05/08/17 0349 05/08/17 0656  WBC 12.5*  --   --   --  7.7  CREATININE 1.58*  --   --   --  1.45*  LATICACIDVEN  --  3.23* 5.75* 5.3*  --     Estimated Creatinine Clearance: 36.6 mL/min (A) (by C-G formula based on SCr of 1.45 mg/dL (H)).    Allergies  Allergen Reactions  . Amiodarone Other (See Comments)    PULMONARY TOXICITY  . Diovan [Valsartan] Other (See Comments)    HYPOTENSION  . Doxycycline Diarrhea and Other (See Comments)    VISUAL DISTURBANCE  . Flexeril [Cyclobenzaprine] Other (See Comments)    FATIGUE  . Keflex [Cephalexin] Diarrhea  . Verapamil Other (See Comments)    EDEMA  . Codeine Other (See Comments)    unknown    Antimicrobials this admission: 2/23 zosyn >>  2/23 vancomycin >>  2/23 tamiflu >>  Dose adjustments this admission:   Microbiology results: 2/24 BCx: sent  2/24 Influenza PCR: positive 2/24 Sputum: sent  2/24 MRSA PCR: negative  Thank you for allowing pharmacy to be a part of this patient's care.   Royetta Asal, PharmD, BCPS Pager [phone] 05/08/2017 1:34 PM

## 2017-05-08 NOTE — Progress Notes (Signed)
  Echocardiogram 2D Echocardiogram has been performed.  Bobbye Charleston 05/08/2017, 9:50 AM

## 2017-05-08 NOTE — Progress Notes (Signed)
ANTICOAGULATION CONSULT NOTE - Follow-Up Consult  Pharmacy Consult for Warfarin Indication: atrial fibrillation  Allergies  Allergen Reactions  . Amiodarone Other (See Comments)    PULMONARY TOXICITY  . Diovan [Valsartan] Other (See Comments)    HYPOTENSION  . Doxycycline Diarrhea and Other (See Comments)    VISUAL DISTURBANCE  . Flexeril [Cyclobenzaprine] Other (See Comments)    FATIGUE  . Keflex [Cephalexin] Diarrhea  . Verapamil Other (See Comments)    EDEMA  . Codeine Other (See Comments)    unknown    Patient Measurements: Height: 5\' 3"  (160 cm) Weight: 208 lb 1.8 oz (94.4 kg) IBW/kg (Calculated) : 52.4   Vital Signs: Temp: 98.1 F (36.7 C) (02/24 0745) Temp Source: Oral (02/24 0745) BP: 166/60 (02/24 1300) Pulse Rate: 78 (02/24 0842)  Labs: Recent Labs    05/07/17 2155 05/08/17 0149 05/08/17 0656  HGB 15.7*  --  13.9  HCT 48.3*  --  43.4  PLT 185  --  170  LABPROT  --  29.1*  --   INR  --  2.78  --   CREATININE 1.58*  --  1.45*  TROPONINI  --  0.76* 0.89*    Estimated Creatinine Clearance: 36.6 mL/min (A) (by C-G formula based on SCr of 1.45 mg/dL (H)).   Medical History: Past Medical History:  Diagnosis Date  . Atrial fibrillation ([place])   . Migraine headache   . Osteopenia     Medications:  Scheduled:  . arformoterol  15 mcg Nebulization BID  . budesonide (PULMICORT) nebulizer solution  0.5 mg Nebulization BID  . ipratropium-albuterol  3 mL Nebulization Q6H  . methylPREDNISolone (SOLU-MEDROL) injection  60 mg Intravenous Q6H  . oseltamivir  30 mg Oral BID   Infusions:  . sodium chloride 75 mL/hr at 05/08/17 0335  . sodium chloride    . diltiazem (CARDIZEM) infusion Stopped (05/07/17 2350)  . norepinephrine Stopped (05/08/17 0719)  . piperacillin-tazobactam (ZOSYN)  IV Stopped (05/08/17 1042)  . [START ON 05/09/2017] vancomycin      Assessment: 76 yoF on chronic warfarin for A-fib admitted with septic shock. HD 4.5 mg daily LD 2/23  0800 INR=2.78    Today, 05/08/17   INR 2.78, therapeutic  Hgb 13.9, plt 170  No bleeding charted    Goal of Therapy:  INR 2-3    Plan:   Warfarin 4.5 mg PO as per home regimen today  Daily PT/INR  Monitor for signs and symptoms of bleeding    Royetta Asal, PharmD, BCPS Pager [phone] 05/08/2017 1:19 PM

## 2017-05-08 NOTE — ED Notes (Signed)
Notified RN,Jake pt. I-stat CG4 Lactic acid results 5.75. Maylon Cos, RN, notified the critical care MD for this writer.

## 2017-05-08 NOTE — Progress Notes (Signed)
Pharmacy Antibiotic Note  Katie [name] is a 77 y.o. female admitted on 05/07/2017 with sepsis.  Pharmacy has been consulted for zosyn, vancomycin, adjust Tamiflu dosing.  Plan: Zosyn 3.375g IV q8h (4 hour infusion).  Vancomycin 1500 mg x1 then 1 Gm IV q48h for est AUC=445 Goal AUC =400-500 Tamiflu 75 mg x1 ED then 30 mg bid Daily scr F/u cultures/levels  Height: 5\' 3"  (160 cm) Weight: 200 lb (90.7 kg) IBW/kg (Calculated) : 52.4  Temp (24hrs), Avg:100.4 F (38 C), Min:98.5 F (36.9 C), Max:102.3 F (39.1 C)  Recent Labs  Lab 05/07/17 2155 05/07/17 2208 05/08/17 0215  WBC 12.5*  --   --   CREATININE 1.58*  --   --   LATICACIDVEN  --  3.23* 5.75*    Estimated Creatinine Clearance: 32.9 mL/min (A) (by C-G formula based on SCr of 1.58 mg/dL (H)).    Allergies  Allergen Reactions  . Amiodarone Other (See Comments)    PULMONARY TOXICITY  . Diovan [Valsartan] Other (See Comments)    HYPOTENSION  . Doxycycline Diarrhea and Other (See Comments)    VISUAL DISTURBANCE  . Flexeril [Cyclobenzaprine] Other (See Comments)    FATIGUE  . Keflex [Cephalexin] Diarrhea  . Verapamil Other (See Comments)    EDEMA  . Codeine Other (See Comments)    unknown    Antimicrobials this admission: 2/23 zosyn >>  2/23 vancomycin >>  2/23 tamiflu >>  Dose adjustments this admission:   Microbiology results:  BCx:   UCx:    Sputum:    MRSA PCR:   Thank you for allowing pharmacy to be a part of this patient's care.  Dorrene German 05/08/2017 2:39 AM

## 2017-05-08 NOTE — Progress Notes (Signed)
[facility] Progress Note Patient Name: Katie [name] DOB: Apr 20, 1940 MRN: [id number]   Date of Service  05/08/2017  HPI/Events of Note  Patient requests 1. Cough medicine and 2. Sleep aid.   eICU Interventions  Will order: 1. Robitussin DM 10 mL PO Q 4 hours PRN cough. 2. Ambien 5 mg PO X 1 now.      Intervention Category Major Interventions: Other:  Lysle Dingwall 05/08/2017, 10:22 PM

## 2017-05-08 NOTE — Progress Notes (Signed)
ANTICOAGULATION CONSULT NOTE - Initial Consult  Pharmacy Consult for Warfarin Indication: atrial fibrillation  Allergies  Allergen Reactions  . Amiodarone Other (See Comments)    PULMONARY TOXICITY  . Diovan [Valsartan] Other (See Comments)    HYPOTENSION  . Doxycycline Diarrhea and Other (See Comments)    VISUAL DISTURBANCE  . Flexeril [Cyclobenzaprine] Other (See Comments)    FATIGUE  . Keflex [Cephalexin] Diarrhea  . Verapamil Other (See Comments)    EDEMA  . Codeine Other (See Comments)    unknown    Patient Measurements: Height: 5\' 3"  (160 cm) Weight: 200 lb (90.7 kg) IBW/kg (Calculated) : 52.4   Vital Signs: Temp: 102.3 F (39.1 C) (02/23 2151) Temp Source: Rectal (02/23 2151) BP: 89/55 (02/24 0215) Pulse Rate: 93 (02/24 0215)  Labs: Recent Labs    05/07/17 2155  HGB 15.7*  HCT 48.3*  PLT 185  CREATININE 1.58*    Estimated Creatinine Clearance: 32.9 mL/min (A) (by C-G formula based on SCr of 1.58 mg/dL (H)).   Medical History: Past Medical History:  Diagnosis Date  . Atrial fibrillation ([place])   . Migraine headache   . Osteopenia     Medications:  Scheduled:  . arformoterol  15 mcg Nebulization BID  . budesonide (PULMICORT) nebulizer solution  0.5 mg Nebulization BID  . ipratropium-albuterol  3 mL Nebulization Q6H  . methylPREDNISolone (SOLU-MEDROL) injection  60 mg Intravenous Q6H   Infusions:  . sodium chloride    . sodium chloride    . diltiazem (CARDIZEM) infusion Stopped (05/07/17 2350)    Assessment: 50 yoF on chronic warfarin for A-fib admitted with septic shock. HD 4.5 mg daily LD 2/23 0800 INR=2.78  Goal of Therapy:  INR 2-3    Plan:  Daily PT/INR  Dorrene German 05/08/2017,2:29 AM

## 2017-05-08 NOTE — ED Notes (Signed)
ED TO INPATIENT HANDOFF REPORT  Name/Age/Gender Katie [name] 77 y.o. female  Code Status    Code Status Orders  (From admission, onward)        Start     Ordered   05/08/17 0154  Full code  Continuous     05/08/17 0156    Code Status History    Date Active Date Inactive Code Status Order ID Comments User Context   07/07/2016 17:56 07/16/2016 16:07 Full Code [id number]  Brenton Grills, PA-C ED   02/04/2016 01:07 02/06/2016 14:37 Full Code [id number]  Geradine Girt, DO Inpatient   12/23/2015 20:58 12/30/2015 16:13 Full Code [id number]  Albertine Patricia, MD Inpatient   04/11/2013 14:36 04/13/2013 19:27 Full Code [id number]  Orbie Hurst Inpatient   09/23/2012 22:55 09/25/2012 22:02 Full Code [id number]  Derrill Kay, MD ED      Home/SNF/Other Home  Chief Complaint shortness of breath  Level of Care/Admitting Diagnosis ED Disposition    [facility]100[facility]  Level of Care: ICU [6]  Diagnosis: Septic shock [facility]) [[id number]]  Admitting Physician: Reyne Dumas [[id number]]  Attending Physician: Reyne Dumas [[id number]]  Estimated length of stay: 3 - 4 days  Certification:: I certify this patient will need inpatient services for at least 2 midnights  PT Class (Do Not Modify): Inpatient [101]  PT Acc Code (Do Not Modify): Private [1]       Medical History Past Medical History:  Diagnosis Date  . Atrial fibrillation ([place])   . Migraine headache   . Osteopenia     Allergies Allergies  Allergen Reactions  . Amiodarone Other (See Comments)    PULMONARY TOXICITY  . Diovan [Valsartan] Other (See Comments)    HYPOTENSION  . Doxycycline Diarrhea and Other (See Comments)    VISUAL DISTURBANCE  . Flexeril [Cyclobenzaprine] Other (See Comments)    FATIGUE  . Keflex [Cephalexin] Diarrhea  . Verapamil Other (See Comments)    EDEMA  . Codeine Other (See Comments)    unknown     IV Location/Drains/Wounds Patient Lines/Drains/Airways Status   Active Line/Drains/Airways    Name:   Placement date:   Placement time:   Site:   Days:   Peripheral IV 05/07/17 Left Hand   05/07/17    2149    Hand   1   Peripheral IV 05/07/17 Right Forearm   05/07/17    2204    Forearm   1   Peripheral IV 05/07/17 Right Hand   05/07/17    2219    Hand   1          Labs/Imaging Results for orders placed or performed during the hospital encounter of 05/07/17 (from the past 48 hour(s))  CBC with Differential     Status: Abnormal   Collection Time: 05/07/17  9:55 PM  Result Value Ref Range   WBC 12.5 (H) 4.0 - 10.5 K/uL   RBC 5.14 (H) 3.87 - 5.11 MIL/uL   Hemoglobin 15.7 (H) 12.0 - 15.0 g/dL   HCT 48.3 (H) 36.0 - 46.0 %   MCV 94.0 78.0 - 100.0 fL   MCH 30.5 26.0 - 34.0 pg   MCHC 32.5 30.0 - 36.0 g/dL   RDW 16.8 (H) 11.5 - 15.5 %   Platelets 185 150 - 400 K/uL   Neutrophils Relative % 72 %   Neutro Abs 8.9 (H) 1.7 - 7.7 K/uL  Lymphocytes Relative 19 %   Lymphs Abs 2.4 0.7 - 4.0 K/uL   Monocytes Relative 9 %   Monocytes Absolute 1.1 (H) 0.1 - 1.0 K/uL   Eosinophils Relative 0 %   Eosinophils Absolute 0.1 0.0 - 0.7 K/uL   Basophils Relative 0 %   Basophils Absolute 0.0 0.0 - 0.1 K/uL    Comment: Performed at [facility], [place] [address] [place], [place] [address]  Basic metabolic panel     Status: Abnormal   Collection Time: 05/07/17  9:55 PM  Result Value Ref Range   Sodium 139 135 - 145 mmol/L   Potassium 4.0 3.5 - 5.1 mmol/L   Chloride 99 (L) 101 - 111 mmol/L   CO2 26 22 - 32 mmol/L   Glucose, Bld 152 (H) 65 - 99 mg/dL   BUN 22 (H) 6 - 20 mg/dL   Creatinine, Ser 1.58 (H) 0.44 - 1.00 mg/dL   Calcium 9.0 8.9 - 10.3 mg/dL   GFR calc non Af Amer 31 (L) >60 mL/min   GFR calc Af Amer 36 (L) >60 mL/min    Comment: (NOTE) The eGFR has been calculated using the CKD EPI equation. This calculation has not been validated in all clinical  situations. eGFR's persistently <60 mL/min signify possible Chronic Kidney Disease.    Anion gap 14 5 - 15    Comment: Performed at [facility] [address], [place], [place] [address]  I-stat troponin, ED     Status: Abnormal   Collection Time: 05/07/17 10:05 PM  Result Value Ref Range   Troponin i, poc 0.42 (HH) 0.00 - 0.08 ng/mL   Comment NOTIFIED PHYSICIAN    Comment 3            Comment: Due to the release kinetics of cTnI, a negative result within the first hours of the onset of symptoms does not rule out myocardial infarction with certainty. If myocardial infarction is still suspected, repeat the test at appropriate intervals.   I-Stat CG4 Lactic Acid, ED     Status: Abnormal   Collection Time: 05/07/17 10:08 PM  Result Value Ref Range   Lactic Acid, Venous 3.23 (HH) 0.5 - 1.9 mmol/L   Comment NOTIFIED PHYSICIAN   Influenza panel by PCR (type A & B)     Status: Abnormal   Collection Time: 05/07/17 10:12 PM  Result Value Ref Range   Influenza A By PCR POSITIVE (A) NEGATIVE   Influenza B By PCR NEGATIVE NEGATIVE    Comment: (NOTE) The Xpert Xpress Flu assay is intended as an aid in the diagnosis of  influenza and should not be used as a sole basis for treatment.  This  assay is FDA approved for nasopharyngeal swab specimens only. Nasal  washings and aspirates are unacceptable for Xpert Xpress Flu testing. Performed at [facility], [place] [address], [place],  [address]   Urinalysis, Routine w reflex microscopic     Status: Abnormal   Collection Time: 05/08/17 12:28 AM  Result Value Ref Range   Color, Urine YELLOW YELLOW   APPearance HAZY (A) CLEAR   Specific Gravity, Urine 1.018 1.005 - 1.030   pH 7.0 5.0 - 8.0   Glucose, UA NEGATIVE NEGATIVE mg/dL   Hgb urine dipstick NEGATIVE NEGATIVE   Bilirubin Urine NEGATIVE NEGATIVE   Ketones, ur NEGATIVE NEGATIVE mg/dL   Protein, ur NEGATIVE NEGATIVE mg/dL   Nitrite NEGATIVE  NEGATIVE   Leukocytes, UA NEGATIVE NEGATIVE  Comment: Performed at [facility], [place] [address] [place], [place] [address]  Blood gas, arterial     Status: Abnormal   Collection Time: 05/08/17  1:20 AM  Result Value Ref Range   O2 Content 4.0 L/min   Delivery systems NASAL CANNULA    pH, Arterial 7.309 (L) 7.350 - 7.450   pCO2 arterial 42.9 32.0 - 48.0 mmHg   pO2, Arterial 108 83.0 - 108.0 mmHg   Bicarbonate 20.3 20.0 - 28.0 mmol/L   Acid-base deficit 4.6 (H) 0.0 - 2.0 mmol/L   O2 Saturation 96.5 %   Patient temperature 102.3    Collection site RIGHT RADIAL    Drawn by [phone]    Sample type ARTERIAL DRAW    Allens test (pass/fail) PASS PASS    Comment: Performed at [facility], [place] [address] [place], [place] [address]  Protime-INR     Status: Abnormal   Collection Time: 05/08/17  1:49 AM  Result Value Ref Range   Prothrombin Time 29.1 (H) 11.4 - 15.2 seconds   INR 2.78     Comment: Performed at [facility] [place] [address], [place],  [address]  I-Stat CG4 Lactic Acid, ED     Status: Abnormal   Collection Time: 05/08/17  2:15 AM  Result Value Ref Range   Lactic Acid, Venous 5.75 (HH) 0.5 - 1.9 mmol/L   Comment NOTIFIED PHYSICIAN    Dg Chest Portable 1 View  Result Date: 05/07/2017 CLINICAL DATA:  Shortness of breath, lethargy. EXAM: PORTABLE CHEST 1 VIEW COMPARISON:  Chest radiograph Jul 14, 2016 FINDINGS: Cardiac silhouette is moderate to severely enlarged and unchanged. Calcified aortic knob. Interstitial prominence, decreased from prior examination without pleural effusion or focal consolidation. No pneumothorax. Soft tissue planes and included osseous structures are non suspicious. IMPRESSION: Moderate to severe cardiomegaly. Interstitial prominence most compatible with pulmonary edema. No focal consolidation. Electronically Signed   By: Elon Alas M.D.   On: 05/07/2017 22:19    Pending Labs Unresulted  Labs (From admission, onward)   Start     Ordered   05/09/17 0500  Procalcitonin  Daily,   R     05/08/17 0024   05/09/17 0500  Creatinine, serum  Daily,   R     05/08/17 0243   05/08/17 0500  Lactic acid, plasma  Tomorrow morning,   R     05/08/17 0024   05/08/17 0500  CBC  Tomorrow morning,   R     05/08/17 0156   05/08/17 7026  Basic metabolic panel  Tomorrow morning,   R     05/08/17 0156   05/08/17 0500  Magnesium  Tomorrow morning,   R     05/08/17 0156   05/08/17 0500  Phosphorus  Tomorrow morning,   R     05/08/17 0156   05/08/17 0155  Cortisol  STAT,   STAT     05/08/17 0156   05/08/17 0145  Magnesium  STAT,   R     05/08/17 0144   05/08/17 0145  Phosphorus  STAT,   R     05/08/17 0144   05/08/17 0136  Troponin I (q 6hr x 3)  Now then every 6 hours,   R     05/08/17 0135   05/08/17 0023  Procalcitonin - Baseline  STAT,   STAT     05/08/17 0024   05/08/17 0023  Culture, expectorated sputum-assessment  STAT,   R  05/08/17 0024   05/07/17 2331  Troponin I (q 6hr x 3)  Now then every 6 hours,   R     05/07/17 2330   05/07/17 2158  Blood culture (routine x 2)  BLOOD CULTURE X 2,   STAT     05/07/17 2157   05/07/17 2158  Urinalysis, Routine w reflex microscopic  STAT,   STAT     05/07/17 2157      Vitals/Pain Today's Vitals   05/08/17 0140 05/08/17 0145 05/08/17 0200 05/08/17 0215  BP: (!) 91/55 (!) 87/57 (!) 92/53 (!) 89/55  Pulse: 97 99 85 93  Resp: '18 18 20 18  ' Temp:      TempSrc:      SpO2: 93% 94% 95% 95%  Weight:      Height:      PainSc:        Isolation Precautions No active isolations  Medications Medications  diltiazem (CARDIZEM) 100 mg in dextrose 5% 134m (1 mg/mL) infusion (0 mg/hr Intravenous Stopped 05/07/17 2350)  acetaminophen (TYLENOL) suppository 650 mg (not administered)    Or  acetaminophen (TYLENOL) tablet 650 mg (not administered)  albuterol (PROVENTIL) (2.5 MG/3ML) 0.083% nebulizer solution 2.5 mg (not administered)   arformoterol (BROVANA) nebulizer solution 15 mcg (15 mcg Nebulization Not Given 05/08/17 0145)  budesonide (PULMICORT) nebulizer solution 0.5 mg (0.5 mg Nebulization Not Given 05/08/17 0145)  ipratropium-albuterol (DUONEB) 0.5-2.5 (3) MG/3ML nebulizer solution 3 mL (3 mLs Nebulization Given 05/08/17 0122)  methylPREDNISolone sodium succinate (SOLU-MEDROL) 125 mg/2 mL injection 60 mg (not administered)  0.9 %  sodium chloride infusion (not administered)  0.9 %  sodium chloride infusion (not administered)  ondansetron (ZOFRAN) injection 4 mg (not administered)  sodium chloride 0.9 % bolus 1,000 mL (0 mLs Intravenous Stopped 05/07/17 2312)  sodium chloride 0.9 % bolus 1,000 mL (0 mLs Intravenous Stopped 05/07/17 2245)  vancomycin (VANCOCIN) 1,500 mg in sodium chloride 0.9 % 500 mL IVPB (0 mg Intravenous Stopped 05/08/17 0109)  piperacillin-tazobactam (ZOSYN) IVPB 3.375 g (0 g Intravenous Stopped 05/07/17 2302)  sodium chloride 0.9 % bolus 1,000 mL (1,000 mLs Intravenous New Bag/Given 05/07/17 2245)  norepinephrine (LEVOPHED) 454min D5W 25019mremix infusion (2 mcg/min Intravenous New Bag/Given 05/08/17 0002)  oseltamivir (TAMIFLU) capsule 75 mg (75 mg Oral Given 05/08/17 0020)    Mobility walks

## 2017-05-08 NOTE — Progress Notes (Signed)
Name: Katie [name] MRN: [id number] DOB: 03-09-1941    ADMISSION DATE:  05/07/2017 CONSULTATION DATE:  05/07/17  REFERRING MD:  Dr Tyrone Nine (ER)  CHIEF COMPLAINT: Septic shock  HISTORY OF PRESENT ILLNESS:   74yoF with hx Afib (on coumadin), CHF (EF 45%), HTN, CKD, Migraines, COPD, Hypothyroidism, who presents to the ER via EMS c/o cough x 1 week with SOB, Confusion, and Lethargy x 1 day. She received nebs, solumedrol 125mg  and APAP 1g from EMS. In the ER she was found to be in Afib with RVR and in shock with temp 102.3. She was given 3L IVF bolus, started on Vancomycin and Zosyn, but remained hypotensive. Levophed was then started. She was started on Diltiazem infusion for her Afib.   On my exam the dilt gtt has now been stopped due to hypotension. HR in 90's off the dilt gtt. Patient reports to me that she came in because she woke up and her "legs wouldn't work." She admits to cough x 3 days occasionally productive of sputum, occasional chronic wheezing only at night, but denies SOB, CP, N/V/D. She is not on home O2. At the time of my exam she says her leg weakness is now resolved.   SIGNIFICANT EVENTS  2/24 admitted with septic shock. Levophed and diltiazem titrated off in PM  Subjective: Today she states that she is much improved.  The generalized weakness in her legs has improved and she was able to get up and walk to the bathroom.  She denies shortness of breath.  Was able to be titrated off of the diltiazem and Levophed drips overnight.  No acute events.  PAST MEDICAL HISTORY :   has a past medical history of Atrial fibrillation ([place]), Migraine headache, and Osteopenia.  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 Prior to Admission  medications   Medication Sig Start Date End Date Taking? Authorizing Provider  acetaminophen (TYLENOL) 500 MG tablet Take 1,000 mg by mouth daily.    Yes [provider]  albuterol (PROVENTIL HFA;VENTOLIN HFA) 108 (90 Base) MCG/ACT inhaler Inhale 2 puffs into the lungs every 4 (four) hours as needed for shortness of breath. Inhale 2 puffs 5-10 min apart every 4 to 6 hrs- to rescue asthma Patient taking differently: Inhale 2 puffs into the lungs every 4 (four) hours as needed for shortness of breath. rescue asthma 12/09/16  Yes [name], MD  allopurinol (ZYLOPRIM) 300 MG tablet Take 300 mg by mouth daily.   Yes [provider]  aspirin EC 81 MG tablet Take 81 mg by mouth daily.   Yes [provider]  azithromycin (ZITHROMAX) 250 MG tablet Take 250 mg by mouth daily. 05/06/17  Yes [provider]  budesonide (PULMICORT) 0.25 MG/2ML nebulizer solution Take 2 mLs (0.25 mg total) by nebulization 2 (two) times daily. Patient taking differently: Take 0.25 mg by nebulization 2 (two) times daily as needed (wheezing).  07/16/16  Yes Ghimire, Henreitta Leber, MD  Cholecalciferol (VITAMIN D3) 5000 units CAPS Takes 2 caps (10,000 units) daily Patient taking differently: Take 5,000 Units by mouth daily.  01/09/17  Yes [name], MD  digoxin (LANOXIN) 0.125 MG tablet Take 1 tablet daily for Heart Rhythm Patient taking differently: Take 0.125 mg by mouth daily. Take 1 tablet daily for Heart Rhythm 04/26/17 04/26/18 Yes [name], MD  diltiazem (TIAZAC) 120 MG 24 hr capsule TAKE 1 CAPSULE BY MOUTH EVERY DAY 12/06/16  Yes Vicie Mutters, PA-C  furosemide (LASIX) 80 MG tablet TAKE 1 TABLET BY MOUTH TWICE A DAY AS NEEDED FOR FLUID Patient taking differently: Take 80 mg by mouth daily. TAKE 1 TABLET BY MOUTH TWICE A DAY AS NEEDED FOR FLUI 01/19/17  Yes [name], MD  gabapentin (NEURONTIN) 600 MG tablet Takes 1/2 to 1 tablet 3 times daily. Patient taking differently:  Take 600 mg by mouth 3 (three) times daily.  11/04/16  Yes [name], MD  levothyroxine (SYNTHROID, LEVOTHROID) 50 MCG tablet TAKE 1 TABLET BY MOUTH EVERY DAY Patient taking differently: Take 1.5 tablets on all days 01/30/17  Yes [name], MD  montelukast (SINGULAIR) 10 MG tablet TAKE 1 TABLET EVERY DAY FOR ALLERGIES 12/28/16  Yes [name], MD  Olopatadine HCl 0.2 % SOLN Place 1 drop into both eyes daily.    Yes [provider]  pantoprazole (PROTONIX) 40 MG tablet Take 1 tablet (40 mg total) by mouth daily. 07/16/16  Yes Ghimire, Henreitta Leber, MD  Polyethyl Glycol-Propyl Glycol (SYSTANE OP) Apply 1 drop to eye 4 (four) times daily.    Yes [provider]  potassium chloride SA (KLOR-CON M20) 20 MEQ tablet TAKE 2 TABLETS (40 MEQ TOTAL) BY MOUTH DAILY. Patient taking differently: Take 40 mEq by mouth daily.  04/11/17  Yes Vicie Mutters, PA-C  pravastatin (PRAVACHOL) 40 MG tablet TAKE 1 TABLET BY MOUTH AT BEDTIME FOR CHOLESTEROL 06/30/16  Yes [name], MD  prednisoLONE acetate (PRED FORTE) 1 % ophthalmic suspension INSTILL 1 DROP INTO BOTH EYES FOUR TIMES A DAY FOR 10 DAYS AND THEN TWICE DAY. 04/27/17  Yes [provider]  predniSONE (DELTASONE) 20 MG tablet 1 tab 3 x day for 3 days, then 1 tab 2 x day for 3 days, then 1 tab 1 x day for 5 days Patient taking differently: Take 20 mg by mouth daily with breakfast. 1 tab 3 x day for 3 days, then 1 tab 2 x day for 3 days, then 1 tab 1 x day for 5 days 04/22/17  Yes [name], MD  prochlorperazine (COMPAZINE) 5 MG tablet TAKE 1 TABLET BY MOUTH THREE TIMES A DAY AS NEEDED FOR VERTIGO OR NAUSEA 02/21/17  Yes [name], MD  promethazine-dextromethorphan (PROMETHAZINE-DM) 6.25-15 MG/5ML syrup Take 1 to 2 tsp enery 4 hours if needed for cough Patient taking differently: Take 5 mLs by mouth every 4 (four) hours as needed for cough.  04/22/17  Yes [name], MD  triamcinolone cream (KENALOG) 0.1 %  Apply 1 application topically 2 (two) times daily. Apply to rash  2 x / day Patient taking differently: Apply 1 application topically 2 (two) times daily as needed. Rash 10/12/16  Yes [name], MD  warfarin (COUMADIN) 3 MG tablet Take 4.5 mg by mouth daily.    Yes [provider]  cetirizine (ZYRTEC) 10 MG tablet TAKE 1 CAPSULE (10 MG TOTAL) BY MOUTH AT BEDTIME. Patient not taking: Reported on 05/07/2017 05/02/16   [name], MD   Allergies  Allergen Reactions  . Amiodarone Other (See Comments)    PULMONARY TOXICITY  . Diovan [Valsartan] Other (See Comments)    HYPOTENSION  . Doxycycline Diarrhea and Other (See Comments)    VISUAL DISTURBANCE  . Flexeril [Cyclobenzaprine] Other (See Comments)    FATIGUE  . Keflex [Cephalexin] Diarrhea  . Verapamil Other (See Comments)    EDEMA  .  Codeine Other (See Comments)    unknown    FAMILY HISTORY:  family history includes Alcohol abuse in her father; Breast cancer in her sister; Cancer (age of onset: 80) in her mother; Cirrhosis in her mother; Depression in her father; Heart defect in her sister; Heart disease in her daughter and sister; Hyperlipidemia in her son; Hypertension in her brother; Stroke in her sister. SOCIAL HISTORY:  reports that she quit smoking about 14 years ago. Her smoking use included cigarettes. she has never used smokeless tobacco. She reports that she drinks alcohol. She reports that she does not use drugs.  SUBJECTIVE:   REVIEW OF SYSTEMS:   Full Review of Systems negative except otherwise noted as above  VITAL SIGNS: Temp:  [97.5 F (36.4 C)-102.3 F (39.1 C)] 98.1 F (36.7 C) (02/24 0745) Pulse Rate:  [62-148] 87 (02/24 1419) Resp:  [14-38] 18 (02/24 1419) BP: (72-178)/(40-157) 166/60 (02/24 1300) SpO2:  [90 %-99 %] 93 % (02/24 1419) Weight:  [200 lb (90.7 kg)-208 lb 1.8 oz (94.4 kg)] 208 lb 1.8 oz (94.4 kg) (02/24 0330)  PHYSICAL EXAMINATION: Physical Exam General: Elderly female,  sitting up in chair, NAD Neuro:  Awake, alert, oriented.  Asking appropriate questions.  Moving all extremities.  HEENT: OP clear Cardiovascular: Irreg Irreg  Lungs: Mild wheezing b/l, no accessory muscle use or respiratory distress Abdomen: Obese, soft NTND Musculoskeletal: no LE edema Skin: no rashes     Recent Labs  Lab 05/07/17 2155 05/08/17 0656  NA 139 137  K 4.0 3.9  CL 99* 103  CO2 26 21*  BUN 22* 20  CREATININE 1.58* 1.45*  GLUCOSE 152* 231*   Recent Labs  Lab 05/07/17 2155 05/08/17 0656  HGB 15.7* 13.9  HCT 48.3* 43.4  WBC 12.5* 7.7  PLT 185 170   Dg Chest Portable 1 View  Result Date: 05/07/2017 CLINICAL DATA:  Shortness of breath, lethargy. EXAM: PORTABLE CHEST 1 VIEW COMPARISON:  Chest radiograph Jul 14, 2016 FINDINGS: Cardiac silhouette is moderate to severely enlarged and unchanged. Calcified aortic knob. Interstitial prominence, decreased from prior examination without pleural effusion or focal consolidation. No pneumothorax. Soft tissue planes and included osseous structures are non suspicious. IMPRESSION: Moderate to severe cardiomegaly. Interstitial prominence most compatible with pulmonary edema. No focal consolidation. Electronically Signed   By: Elon Alas M.D.   On: 05/07/2017 22:19   CULTURES: Blood cultures (2/24): NGTD Sputum culture: ordered, not collected  ANTIBIOTICS: Vancomycin 2/23 >> Zosyn 2/23 >>  SIGNIFICANT EVENTS: 2/23: presented to ER with SOB, Cough, Confusion, Lethargy, Wheezing >> found to be febrile, hypotensive, in Afib with RVR >> Flu A +, Dilt gtt 2/24 off drips  LINES/TUBES: 2-20G and 1-22G PIV    ASSESSMENT / PLAN:  76yoF with hx Afib (on coumadin), CHF (EF 45%), HTN, CKD, Migraines, COPD, Hypothyroidism, who presents to the ER via EMS c/o cough x 1 week with SOB, Confusion, and Lethargy x 1 day. On arrival she was found to be in COPD exacerbation, with Septic shock, and Afib with RVR.   ASSESSMENT /  PLAN:  PULMONARY 1. COPD exacerbation: -Discontinue methylprednisolone, start prednisone 40 mg for total of 5-day course. -Continue Pulmicort and Brovana. continue duonebs q6hrs as she is still wheezing on exam -Respiratory status significantly improved, now on room air  CARDIOVASCULAR 1. Afib with RVR:  -Now rate controlled off diltiazem drip.  Blood pressures rising so will restart her home diltiazem at a reduced dose of 30 mg q12h and titrate back  to home 120 mg daily. Add home digoxin once renal function improves back to baseline - check coags; continue coumadin  2. Hx CHF: - TTE in 06/2016 showed EF 45-50% but previous to that had EF 60%.  - Obtain new TTE -no significant change from most recent  3. Hx HTN: -Diltiazem as above  4. Troponinemia - likely demand - not rising, no new ischemic changes on EKG only old LBBB.   RENAL 1. AKI-on-CKD: - improving s/p 3L IVF bolus - place foley catheter; monitor UOP closely. Avoid nephrotoxic agents  GASTROINTESTINAL No active issues; NPO  HEMATOLOGIC No active issues.   INFECTIOUS 1. Septic shock; Flu A  - shock resolved - Flu A positive by PCR; continue droplet precautions and tamiflu - pan-culture, repeat LA. procalcitonin negative - continue Vanc and Zosyn for now given that her blood cultures are still in process however given the negative pro calcitonin, clear chest x-ray, and rapid improvement with IV fluids these should be stopped if cultures remain negative - s/p 3L IVF bolus but still hypotensive now on low dose Levophed gtt  ENDOCRINE No active issues   NEUROLOGIC 1. Acute encephalopathy: likely metabolic in nature due to her acute illness; ABG shows no hypercapnea. No focal weakness or other focal neuro findings on my exam.  -Now resolved   FAMILY  - Updates: no family present in ER at time of my exam - Inter-disciplinary family meet or Palliative Care meeting due by: 05/14/17   Mali Nizar Cutler,  MD Pulmonary and [place] Pager: [phone]  05/08/2017, 2:40 PM

## 2017-05-08 NOTE — ED Notes (Signed)
Critical care MD at bedside 

## 2017-05-08 NOTE — Progress Notes (Signed)
CRITICAL VALUE ALERT  Critical Value:  Lactic Acid 3.4  Date & Time Notied:  05/08/17 1540  Provider Notified: Dr. Dellie Catholic  Orders Received/Actions taken: No new orders

## 2017-05-08 NOTE — H&P (Signed)
PULMONARY / CRITICAL CARE MEDICINE   Name: Katie [name] MRN: [id number] DOB: [birth year]    ADMISSION DATE:  05/07/2017 CONSULTATION DATE: 05/08/17  REFERRING MD:  Dr Tyrone Nine (ER)  CHIEF COMPLAINT: Septic shock  HISTORY OF PRESENT ILLNESS:   2yoF with hx Afib (on coumadin), CHF (EF 45%), HTN, CKD, Migraines, COPD, Hypothyroidism, who presents to the ER via EMS c/o cough x 1 week with SOB, Confusion, and Lethargy x 1 day. She received nebs, solumedrol 125mg  and APAP 1g from EMS. In the ER she was found to be in Afib with RVR and in shock with temp 102.3. She was given 3L IVF bolus, started on Vancomycin and Zosyn, but remained hypotensive. Levophed was then started. She was started on Diltiazem infusion for her Afib.   On my exam the dilt gtt has now been stopped due to hypotension. HR in 90's off the dilt gtt. Patient reports to me that she came in because she woke up and her "legs wouldn't work." She admits to cough x 3 days occasionally productive of sputum, occasional chronic wheezing only at night, but denies SOB, CP, N/V/D. She is not on home O2. At the time of my exam she says her leg weakness is now resolved.   PAST MEDICAL HISTORY :  She  has a past medical history of Atrial fibrillation ([place]), Migraine headache, and Osteopenia.  PAST SURGICAL HISTORY: She  has a past surgical history that includes broken leg (Left, 1970s); Laser vein procedure; Refractive surgery; Colonoscopy; Cholecystectomy (1972); Tonsillectomy; Eye surgery (Bilateral); Cardiac catheterization; Abdominal aortic endovascular stent graft (N/A, 04/11/2013); and Breast surgery.  Allergies  Allergen Reactions  . Amiodarone Other (See Comments)    PULMONARY TOXICITY  . Diovan [Valsartan] Other (See Comments)    HYPOTENSION  . Doxycycline Diarrhea and Other (See Comments)    VISUAL DISTURBANCE  . Flexeril [Cyclobenzaprine] Other (See Comments)    FATIGUE  . Keflex [Cephalexin] Diarrhea  . Verapamil Other (See  Comments)    EDEMA  . Codeine Other (See Comments)    unknown   No current facility-administered medications on file prior to encounter.    Current Outpatient Medications on File Prior to Encounter  Medication Sig  . acetaminophen (TYLENOL) 500 MG tablet Take 1,000 mg by mouth daily.   Marland Kitchen albuterol (PROVENTIL HFA;VENTOLIN HFA) 108 (90 Base) MCG/ACT inhaler Inhale 2 puffs into the lungs every 4 (four) hours as needed for shortness of breath. Inhale 2 puffs 5-10 min apart every 4 to 6 hrs- to rescue asthma (Patient taking differently: Inhale 2 puffs into the lungs every 4 (four) hours as needed for shortness of breath. rescue asthma)  . allopurinol (ZYLOPRIM) 300 MG tablet Take 300 mg by mouth daily.  Marland Kitchen aspirin EC 81 MG tablet Take 81 mg by mouth daily.  Marland Kitchen azithromycin (ZITHROMAX) 250 MG tablet Take 250 mg by mouth daily.  . budesonide (PULMICORT) 0.25 MG/2ML nebulizer solution Take 2 mLs (0.25 mg total) by nebulization 2 (two) times daily. (Patient taking differently: Take 0.25 mg by nebulization 2 (two) times daily as needed (wheezing). )  . Cholecalciferol (VITAMIN D3) 5000 units CAPS Takes 2 caps (10,000 units) daily (Patient taking differently: Take 5,000 Units by mouth daily. )  . digoxin (LANOXIN) 0.125 MG tablet Take 1 tablet daily for Heart Rhythm (Patient taking differently: Take 0.125 mg by mouth daily. Take 1 tablet daily for Heart Rhythm)  . diltiazem (TIAZAC) 120 MG 24 hr capsule TAKE 1 CAPSULE BY MOUTH EVERY DAY  .  furosemide (LASIX) 80 MG tablet TAKE 1 TABLET BY MOUTH TWICE A DAY AS NEEDED FOR FLUID (Patient taking differently: Take 80 mg by mouth daily. TAKE 1 TABLET BY MOUTH TWICE A DAY AS NEEDED FOR FLUI)  . gabapentin (NEURONTIN) 600 MG tablet Takes 1/2 to 1 tablet 3 times daily. (Patient taking differently: Take 600 mg by mouth 3 (three) times daily. )  . levothyroxine (SYNTHROID, LEVOTHROID) 50 MCG tablet TAKE 1 TABLET BY MOUTH EVERY DAY (Patient taking differently: Take 1.5  tablets on all days)  . montelukast (SINGULAIR) 10 MG tablet TAKE 1 TABLET EVERY DAY FOR ALLERGIES  . Olopatadine HCl 0.2 % SOLN Place 1 drop into both eyes daily.   . pantoprazole (PROTONIX) 40 MG tablet Take 1 tablet (40 mg total) by mouth daily.  [name] Glycol-Propyl Glycol (SYSTANE OP) Apply 1 drop to eye 4 (four) times daily.   . potassium chloride SA (KLOR-CON M20) 20 MEQ tablet TAKE 2 TABLETS (40 MEQ TOTAL) BY MOUTH DAILY. (Patient taking differently: Take 40 mEq by mouth daily. )  . pravastatin (PRAVACHOL) 40 MG tablet TAKE 1 TABLET BY MOUTH AT BEDTIME FOR CHOLESTEROL  . prednisoLONE acetate (PRED FORTE) 1 % ophthalmic suspension INSTILL 1 DROP INTO BOTH EYES FOUR TIMES A DAY FOR 10 DAYS AND THEN TWICE DAY.  Marland Kitchen predniSONE (DELTASONE) 20 MG tablet 1 tab 3 x day for 3 days, then 1 tab 2 x day for 3 days, then 1 tab 1 x day for 5 days (Patient taking differently: Take 20 mg by mouth daily with breakfast. 1 tab 3 x day for 3 days, then 1 tab 2 x day for 3 days, then 1 tab 1 x day for 5 days)  . prochlorperazine (COMPAZINE) 5 MG tablet TAKE 1 TABLET BY MOUTH THREE TIMES A DAY AS NEEDED FOR VERTIGO OR NAUSEA  . promethazine-dextromethorphan (PROMETHAZINE-DM) 6.25-15 MG/5ML syrup Take 1 to 2 tsp enery 4 hours if needed for cough (Patient taking differently: Take 5 mLs by mouth every 4 (four) hours as needed for cough. )  . triamcinolone cream (KENALOG) 0.1 % Apply 1 application topically 2 (two) times daily. Apply to rash  2 x / day (Patient taking differently: Apply 1 application topically 2 (two) times daily as needed. Rash)  . warfarin (COUMADIN) 3 MG tablet Take 4.5 mg by mouth daily.   . cetirizine (ZYRTEC) 10 MG tablet TAKE 1 CAPSULE (10 MG TOTAL) BY MOUTH AT BEDTIME. (Patient not taking: Reported on 05/07/2017)   FAMILY HISTORY:  Her indicated that her mother is deceased. She indicated that her father is deceased. She indicated that both of her sisters are alive. She indicated that only  one of her three brothers is alive. She indicated that both of her daughters are alive. She indicated that her son is alive.  SOCIAL HISTORY: She  reports that she quit smoking about 14 years ago. Her smoking use included cigarettes. she has never used smokeless tobacco. She reports that she drinks alcohol. She reports that she does not use drugs.  REVIEW OF SYSTEMS:   Review of Systems  Constitutional: Positive for fever and malaise/fatigue.  HENT: Negative.   Eyes: Negative.   Respiratory: Positive for cough, sputum production, shortness of breath and wheezing.   Cardiovascular: Negative.   Gastrointestinal: Negative.   Genitourinary: Negative.   Musculoskeletal: Negative.   Skin: Negative.   Neurological: Positive for focal weakness and weakness.  Endo/Heme/Allergies: Negative.   Psychiatric/Behavioral: Negative.    SUBJECTIVE:  Lying  on ER stretcher getting a nebulizer treatment  VITAL SIGNS: BP 101/61   Pulse 78   Temp (!) 102.3 F (39.1 C) (Rectal)   Resp 19   Ht 5\' 3"  (1.6 m)   Wt 90.7 kg (200 lb)   SpO2 94%   BMI 35.43 kg/m   HEMODYNAMICS:  Levophed @ 2 via PIV  INTAKE / OUTPUT: No intake/output data recorded.  PHYSICAL EXAMINATION: General: Elderly female, lying on ER stretcher receiving neb treatment, in NAD Neuro: sleepy but awake, answering questions, moving all extremities. PERRL, BLE 5/5 motor. Sensation intact throughout.  HEENT: OP clear, MM dry Cardiovascular: Irreg Irreg with rate in 90's Lungs: Mild wheezing b/l, no accessory muscle use or respiratory distress Abdomen: Obese, soft NTND, BS+ Musculoskeletal: no LE edema Skin: no rashes   LABS:  BMET Recent Labs  Lab 05/07/17 2155  NA 139  K 4.0  CL 99*  CO2 26  BUN 22*  CREATININE 1.58*  GLUCOSE 152*   Electrolytes Recent Labs  Lab 05/07/17 2155  CALCIUM 9.0   CBC Recent Labs  Lab 05/07/17 2155  WBC 12.5*  HGB 15.7*  HCT 48.3*  PLT 185   Coag's No results for input(s):  APTT, INR in the last 168 hours.  Sepsis Markers Recent Labs  Lab 05/07/17 2208  LATICACIDVEN 3.23*   ABG No results for input(s): PHART, PCO2ART, PO2ART in the last 168 hours.  Liver Enzymes No results for input(s): AST, ALT, ALKPHOS, BILITOT, ALBUMIN in the last 168 hours.  Cardiac Enzymes No results for input(s): TROPONINI, PROBNP in the last 168 hours.  Glucose No results for input(s): GLUCAP in the last 168 hours.  Imaging Dg Chest Portable 1 View  Result Date: 05/07/2017 CLINICAL DATA:  Shortness of breath, lethargy. EXAM: PORTABLE CHEST 1 VIEW COMPARISON:  Chest radiograph Jul 14, 2016 FINDINGS: Cardiac silhouette is moderate to severely enlarged and unchanged. Calcified aortic knob. Interstitial prominence, decreased from prior examination without pleural effusion or focal consolidation. No pneumothorax. Soft tissue planes and included osseous structures are non suspicious. IMPRESSION: Moderate to severe cardiomegaly. Interstitial prominence most compatible with pulmonary edema. No focal consolidation. Electronically Signed   By: Elon Alas M.D.   On: 05/07/2017 22:19   CULTURES: Blood cultures (2/24): pending Sputum culture: ordered  ANTIBIOTICS: Vancomycin 2/23 >> Zosyn 2/23 >>  SIGNIFICANT EVENTS: 2/23: presented to ER with SOB, Cough, Confusion, Lethargy, Wheezing >> found to be febrile, hypotensive, in Afib with RVR >> Flu A +, Dilt gtt  LINES/TUBES: 2-20G and 1-22G PIV  DISCUSSION: 76yoF with hx Afib (on coumadin), CHF (EF 45%), HTN, CKD, Migraines, COPD, Hypothyroidism, who presents to the ER via EMS c/o cough x 1 week with SOB, Confusion, and Lethargy x 1 day. On arrival she was found to be in COPD exacerbation, with Septic shock, and Afib with RVR.   ASSESSMENT / PLAN:  PULMONARY 1. COPD exacerbation: - received nebs, solumedrol 125mg  IV via EMS - start pulmicort and brovana nebs BID, solumedrol 60mg  IV q6hrs, and continue duonebs q6hrs - ABG  shows only mild metabolic acidosis; no need for BIPAP - CXR on my review shows no infiltrates  CARDIOVASCULAR 1. Afib with RVR: now rate-controlled and dilt gtt has been stopped; am concerned she may go back into RVR though.  - monitor closely - check coags; continue coumadin  2. Hx CHF: - TTE in 06/2016 showed EF 45-50% but previous to that had EF 60%.  - She appears clinically dry at this time  so will continue IVF's - Obtain new TTE  3. Hx HTN: - hold home antihypertensives given her current shock  RENAL 1. AKI-on-CKD: - creatinine 1.58, up from baseline of 1.2-1.3\ - s/p 3L IVF bolus - place foley catheter; monitor UOP closely. Avoid nephrotoxic agents  GASTROINTESTINAL No active issues; NPO  HEMATOLOGIC No active issues.   INFECTIOUS 1. Septic shock; Flu A: - Flu A positive by PCR; continue droplet precautions and tamiflu - pan-culture, trend lactate and procalcitonin - continue Vanc and Zosyn - s/p 3L IVF bolus but still hypotensive now on low dose Levophed gtt - hold off on CVC at this time as she is only on Levophed @ 103mcg and still looks clinically dry - start NS @ 75cc/hr - APAP PRN and cooling blanket   ENDOCRINE No active issues   NEUROLOGIC 1. Acute encephalopathy: likely metabolic in nature due to her acute illness; ABG shows no hypercapnea. No focal weakness or other focal neuro findings on my exam. Continue to monitor   FAMILY  - Updates: no family present in ER at time of my exam - Inter-disciplinary family meet or Palliative Care meeting due by: 05/14/17   60 minutes critical care time  Vernie Murders, MD  Pulmonary and [place] Pager: [phone]  05/08/2017, 12:48 AM

## 2017-05-09 DIAGNOSIS — I10 Essential (primary) hypertension: Secondary | ICD-10-CM

## 2017-05-09 DIAGNOSIS — I482 Chronic atrial fibrillation: Secondary | ICD-10-CM

## 2017-05-09 DIAGNOSIS — J449 Chronic obstructive pulmonary disease, unspecified: Secondary | ICD-10-CM

## 2017-05-09 LAB — PROTIME-INR
INR: 4.2
Prothrombin Time: 40.2 seconds — ABNORMAL HIGH (ref 11.4–15.2)

## 2017-05-09 LAB — PROCALCITONIN: PROCALCITONIN: 0.43 ng/mL

## 2017-05-09 LAB — CREATININE, SERUM
CREATININE: 1.22 mg/dL — AB (ref 0.44–1.00)
GFR calc non Af Amer: 42 mL/min — ABNORMAL LOW (ref >60)
GFR, EST AFRICAN AMERICAN: 49 mL/min — AB (ref >60)

## 2017-05-09 MED ORDER — DIGOXIN 125 MCG PO TABS
0.1250 mg | ORAL_TABLET | Freq: Every day | ORAL | Status: DC
  Administered 2017-05-09 – 2017-05-10 (×2): 0.125 mg via ORAL
  Filled 2017-05-09 (×2): qty 1

## 2017-05-09 MED ORDER — GABAPENTIN 300 MG PO CAPS
300.0000 mg | ORAL_CAPSULE | Freq: Three times a day (TID) | ORAL | Status: DC
  Administered 2017-05-09 – 2017-05-10 (×6): 300 mg via ORAL
  Filled 2017-05-09 (×6): qty 1

## 2017-05-09 MED ORDER — MENTHOL 3 MG MT LOZG
1.0000 | LOZENGE | OROMUCOSAL | Status: DC | PRN
  Administered 2017-05-09 (×3): 3 mg via ORAL
  Filled 2017-05-09 (×2): qty 9

## 2017-05-09 MED ORDER — DILTIAZEM HCL ER COATED BEADS 120 MG PO CP24
120.0000 mg | ORAL_CAPSULE | Freq: Every day | ORAL | Status: DC
  Administered 2017-05-09 – 2017-05-10 (×2): 120 mg via ORAL
  Filled 2017-05-09 (×2): qty 1

## 2017-05-09 NOTE — Progress Notes (Signed)
PROGRESS NOTE [facility]   Katie [name]   XTK:[id number] DOB: 26-Nov-1940  DOA: 05/07/2017 PCP: [name], MD   Brief Narrative:  Katie [name] is a 77 year old female with medical history of A. fib, CHF, hypertension, CKD, COPD, hypothyroidism and history of migraine who presented to the emergency department with cough, associated with shortness of breath, confusion and lethargy.  In the ER he was found to be in A. fib with RVR and hypotensive shock as well febrile with imperative of 102.3.  Influenza A positive. Patient was started on IV fluids and empiric antibiotic and she was admitted to the ICU due to septic shock not responding to IV fluid.  Subsequently started on levophed, which eventually was weaned off and patient blood pressure was normalized.  Patient also received Cardizem drip for a short period due to hypotension.  Patient has been transferred to hospitalist service for further management.  Subjective: Patient seen and examined, report felling much better, her breathing has improved significantly. No acute events overnight.  Patient denies chest pain.  She remains afebrile.  Assessment & Plan: Septic shock due to  influenza A Continue Tamiflu Blood cultures no growth so far Patient was initially treated with vancomycin and Zosyn no signs of bacterial infection we will discontinue antibiotics. Continue to monitor  Acute metabolic encephalopathy Due to infectious process Resolved Continue to treat underlying causes  A. fib with RVR Due to infectious process Initially treated with Cardizem drip which has been transitioned to oral diltiazem.  Will resume her home dose Cardizem 120 mg daily.  Will also add digoxin as renal function is back to her baseline. Anticoagulation with Coumadin, INR elevated today hold warfarin  Elevated troponin Felt to be secondary to demand ischemia No ischemic changes on EKG, old LBBB No further cardiac workup  AKI on CKD  stage III Prerenal Treated with IV fluid, creatinine now back to baseline. Can DC IV fluids as patient is tolerating oral diet Check BMP in a.m.  Chronic diastolic CHF No signs of fluid overload Echocardiogram with EF 45%, no significant changes from previous Continue to monitor  Hypertension BP slightly elevated, monitor Continue current regimen  DVT prophylaxis: Warfarin, will hold due to elevated INR Code Status: Full code Family Communication: None at bedside Disposition Plan: Home in the next 24-48 hours  Consultants:   None  Procedures:   Echo  Antimicrobials:      Objective: Vitals:   05/09/17 0435 05/09/17 0653 05/09/17 0758 05/09/17 0903  BP: (!) 163/93   (!) 164/91  Pulse:      Resp: 18   (!) 23  Temp:  (!) 97.5 F (36.4 C)    TempSrc:  Oral    SpO2: 90%  93% 93%  Weight:      Height:        Intake/Output Summary (Last 24 hours) at 05/09/2017 0929 Last data filed at 05/09/2017 0500 Gross per 24 hour  Intake 1850 ml  Output 1300 ml  Net 550 ml   Filed Weights   05/07/17 2223 05/08/17 0322 05/08/17 0330  Weight: 90.7 kg (200 lb) 94.4 kg (208 lb 1.8 oz) 94.4 kg (208 lb 1.8 oz)    Examination:  General exam: Appears calm and comfortable  HEENT: OP moist and clear Respiratory system: Diminished BS bilaterally, diffuse rhonchi with mild expiratory wheezing. Cardiovascular system: S1 & S2 heard, RRR. No JVD, murmurs, rubs or gallops Gastrointestinal system: Abdomen is nondistended, soft and nontender.  Central nervous system: Alert  and oriented. No focal neurological deficits. Extremities: No lower extremity edema Skin: No rashes Psychiatry: Mood & affect appropriate.    Data Reviewed: I have personally reviewed following labs and imaging studies  CBC: Recent Labs  Lab 05/07/17 2155 05/08/17 0656  WBC 12.5* 7.7  NEUTROABS 8.9*  --   HGB 15.7* 13.9  HCT 48.3* 43.4  MCV 94.0 94.6  PLT 185 409   Basic Metabolic Panel: Recent Labs    Lab 05/07/17 2155 05/08/17 0149 05/08/17 0656 05/09/17 0319  NA 139  --  137  --   K 4.0  --  3.9  --   CL 99*  --  103  --   CO2 26  --  21*  --   GLUCOSE 152*  --  231*  --   BUN 22*  --  20  --   CREATININE 1.58*  --  1.45* 1.22*  CALCIUM 9.0  --  7.8*  --   MG  --  2.0 2.1  --   PHOS  --  3.2 2.3*  --    GFR: Estimated Creatinine Clearance: 43.5 mL/min (A) (by C-G formula based on SCr of 1.22 mg/dL (H)). Liver Function Tests: No results for input(s): AST, ALT, ALKPHOS, BILITOT, PROT, ALBUMIN in the last 168 hours. No results for input(s): LIPASE, AMYLASE in the last 168 hours. No results for input(s): AMMONIA in the last 168 hours. Coagulation Profile: Recent Labs  Lab 05/08/17 0149  INR 2.78   Cardiac Enzymes: Recent Labs  Lab 05/08/17 0149 05/08/17 0656 05/08/17 1218  TROPONINI 0.76* 0.89* 0.76*   BNP (last 3 results) No results for input(s): PROBNP in the last 8760 hours. HbA1C: No results for input(s): HGBA1C in the last 72 hours. CBG: No results for input(s): GLUCAP in the last 168 hours. Lipid Profile: No results for input(s): CHOL, HDL, LDLCALC, TRIG, CHOLHDL, LDLDIRECT in the last 72 hours. Thyroid Function Tests: No results for input(s): TSH, T4TOTAL, FREET4, T3FREE, THYROIDAB in the last 72 hours. Anemia Panel: No results for input(s): VITAMINB12, FOLATE, FERRITIN, TIBC, IRON, RETICCTPCT in the last 72 hours. Sepsis Labs: Recent Labs  Lab 05/08/17 0149 05/08/17 0215 05/08/17 0349 05/08/17 1448 05/08/17 1854 05/09/17 0319  PROCALCITON 0.35  --   --   --   --  0.43  LATICACIDVEN  --  5.75* 5.3* 3.4* 2.7*  --     Recent Results (from the past 240 hour(s))  Blood culture (routine x 2)     Status: None (Preliminary result)   Collection Time: 05/07/17  9:55 PM  Result Value Ref Range Status   Specimen Description   Final    BLOOD RIGHT FOREARM Performed at [facility], [place] [address], [place], [place] [address]     Special Requests   Final    BOTTLES DRAWN AEROBIC AND ANAEROBIC Blood Culture adequate volume Performed at [facility] [address], [place], [place] [address]    Culture   Final    NO GROWTH < 24 HOURS Performed at [facility] [address], [place], [place] [address]    Report Status PENDING  Incomplete  Blood culture (routine x 2)     Status: None (Preliminary result)   Collection Time: 05/07/17 10:03 PM  Result Value Ref Range Status   Specimen Description   Final    BLOOD RIGHT HAND Performed at [facility] [address] [place], [place] [address]    Special  Requests   Final    BOTTLES DRAWN AEROBIC AND ANAEROBIC Blood Culture adequate volume Performed at [facility] [address], [place], [place] [address]    Culture   Final    NO GROWTH < 24 HOURS Performed at [facility] [address] [place], [place] [address]    Report Status PENDING  Incomplete  MRSA PCR Screening     Status: None   Collection Time: 05/08/17  4:26 AM  Result Value Ref Range Status   MRSA by PCR NEGATIVE NEGATIVE Final    Comment:        The GeneXpert MRSA Assay (FDA approved for NASAL specimens only), is one component of a comprehensive MRSA colonization surveillance program. It is not intended to diagnose MRSA infection nor to guide or monitor treatment for MRSA infections. Performed at [facility], [place] [address] [place],  [address]       Radiology Studies: Dg Chest Portable 1 View  Result Date: 05/07/2017 CLINICAL DATA:  Shortness of breath, lethargy. EXAM: PORTABLE CHEST 1 VIEW COMPARISON:  Chest radiograph Jul 14, 2016 FINDINGS: Cardiac silhouette is moderate to severely enlarged and unchanged. Calcified aortic knob. Interstitial prominence, decreased from prior examination without pleural effusion or focal consolidation. No pneumothorax. Soft tissue planes and included  osseous structures are non suspicious. IMPRESSION: Moderate to severe cardiomegaly. Interstitial prominence most compatible with pulmonary edema. No focal consolidation. Electronically Signed   By: Elon Alas M.D.   On: 05/07/2017 22:19      Scheduled Meds: . arformoterol  15 mcg Nebulization BID  . budesonide (PULMICORT) nebulizer solution  0.5 mg Nebulization BID  . digoxin  0.125 mg Oral Daily  . diltiazem  120 mg Oral Daily  . gabapentin  300 mg Oral TID  . ipratropium-albuterol  3 mL Nebulization Q6H  . olopatadine  1 drop Both Eyes BID  . oseltamivir  30 mg Oral BID  . polyvinyl alcohol  1 drop Both Eyes QID  . prednisoLONE acetate  1 drop Both Eyes BID  . Warfarin - Pharmacist Dosing Inpatient   Does not apply q1800   Continuous Infusions: . sodium chloride 75 mL/hr at 05/09/17 0500  . sodium chloride       LOS: 1 day    Time spent: Total of 25 minutes spent with pt, greater than 50% of which was spent in discussion of  treatment, counseling and coordination of care    Chipper Oman, MD Pager: Text Page via [email or web address]   If 7PM-7AM, please contact night-coverage [email or web address] 05/09/2017, 9:29 AM   Note - This record has been created using [employer]. Chart creation errors have been sought, but may not always have been located. Such creation errors do not reflect on the standard of medical care.

## 2017-05-09 NOTE — Progress Notes (Signed)
eLink Physician-Brief Progress Note Patient Name: Katie [name] DOB: Aug 02, 1940 MRN: [id number]   Date of Service  05/09/2017  HPI/Events of Note  Multiple issues: 1. Patient requests home Neurontin for neuropathic pain and 2. Patient c/o throat pain.   eICU Interventions  Will order: 1. Neurontin 300 mg PO TID. 2. Cepacol lozenge 3 mg PO PRN.      Intervention Category Major Interventions: Other:  Lysle Dingwall 05/09/2017, 12:38 AM

## 2017-05-09 NOTE — Progress Notes (Signed)
CRITICAL VALUE ALERT  Critical Value:  Lactic Acid 2.7  Date & Time Notied:  05/08/17 & 2041  Provider Notified: Dr. Oletta Darter  Orders Received/Actions taken: No new orders

## 2017-05-09 NOTE — Progress Notes (Signed)
ANTICOAGULATION CONSULT NOTE - Follow-Up Consult  Pharmacy Consult for Warfarin Indication: atrial fibrillation  Allergies  Allergen Reactions  . Amiodarone Other (See Comments)    PULMONARY TOXICITY  . Diovan [Valsartan] Other (See Comments)    HYPOTENSION  . Doxycycline Diarrhea and Other (See Comments)    VISUAL DISTURBANCE  . Flexeril [Cyclobenzaprine] Other (See Comments)    FATIGUE  . Keflex [Cephalexin] Diarrhea  . Verapamil Other (See Comments)    EDEMA  . Codeine Other (See Comments)    unknown    Patient Measurements: Height: 5\' 3"  (160 cm) Weight: 208 lb 1.8 oz (94.4 kg) IBW/kg (Calculated) : 52.4   Vital Signs: Temp: 97.5 F (36.4 C) (02/25 0653) Temp Source: Oral (02/25 0653) BP: 164/91 (02/25 0903)  Labs: Recent Labs    05/07/17 2155 05/08/17 0149 05/08/17 0656 05/08/17 1218 05/09/17 0319 05/09/17 0740  HGB 15.7*  --  13.9  --   --   --   HCT 48.3*  --  43.4  --   --   --   PLT 185  --  170  --   --   --   LABPROT  --  29.1*  --   --   --  40.2*  INR  --  2.78  --   --   --  4.20*  CREATININE 1.58*  --  1.45*  --  1.22*  --   TROPONINI  --  0.76* 0.89* 0.76*  --   --     Estimated Creatinine Clearance: 43.5 mL/min (A) (by C-G formula based on SCr of 1.22 mg/dL (H)).   Medical History: Past Medical History:  Diagnosis Date  . Atrial fibrillation ([place])   . Migraine headache   . Osteopenia     Medications:  Scheduled:  . arformoterol  15 mcg Nebulization BID  . budesonide (PULMICORT) nebulizer solution  0.5 mg Nebulization BID  . digoxin  0.125 mg Oral Daily  . diltiazem  120 mg Oral Daily  . gabapentin  300 mg Oral TID  . ipratropium-albuterol  3 mL Nebulization Q6H  . olopatadine  1 drop Both Eyes BID  . oseltamivir  30 mg Oral BID  . polyvinyl alcohol  1 drop Both Eyes QID  . prednisoLONE acetate  1 drop Both Eyes BID  . Warfarin - Pharmacist Dosing Inpatient   Does not apply q1800   Infusions:  . sodium chloride 75 mL/hr at  05/09/17 0500  . sodium chloride      Assessment: 76 yoF on chronic warfarin for A-fib admitted with septic shock. Home dose reported as 4.5 mg daily, last take 2/23 0800. INR on admission is therapeutic at 2.78    Today, 05/09/17   INR 4.2, SUPRAtherapeutic  Hgb 13.9, plt 170 on 2/24  No bleeding charted  Goal of Therapy:  INR 2-3   Plan:   No warfarin today  Daily PT/INR  Monitor for signs and symptoms of bleeding   Peggyann Juba, PharmD, BCPS Pager: [phone] 05/09/2017 9:43 AM

## 2017-05-10 ENCOUNTER — Other Ambulatory Visit: Payer: Self-pay | Admitting: Internal Medicine

## 2017-05-10 DIAGNOSIS — N183 Chronic kidney disease, stage 3 (moderate): Secondary | ICD-10-CM

## 2017-05-10 LAB — BASIC METABOLIC PANEL
ANION GAP: 12 (ref 5–15)
BUN: 22 mg/dL — ABNORMAL HIGH (ref 6–20)
CALCIUM: 8.8 mg/dL — AB (ref 8.9–10.3)
CO2: 22 mmol/L (ref 22–32)
CREATININE: 1.15 mg/dL — AB (ref 0.44–1.00)
Chloride: 105 mmol/L (ref 101–111)
GFR, EST AFRICAN AMERICAN: 53 mL/min — AB (ref >60)
GFR, EST NON AFRICAN AMERICAN: 45 mL/min — AB (ref >60)
Glucose, Bld: 104 mg/dL — ABNORMAL HIGH (ref 65–99)
Potassium: 3.4 mmol/L — ABNORMAL LOW (ref 3.5–5.1)
SODIUM: 139 mmol/L (ref 135–145)

## 2017-05-10 LAB — PROTIME-INR
INR: 3.3
PROTHROMBIN TIME: 33.3 s — AB (ref 11.4–15.2)

## 2017-05-10 LAB — PROCALCITONIN: PROCALCITONIN: 0.37 ng/mL

## 2017-05-10 MED ORDER — OSELTAMIVIR PHOSPHATE 30 MG PO CAPS: 30.0000 mg | ORAL_CAPSULE | Freq: Two times a day (BID) | ORAL | 0 refills | Status: AC

## 2017-05-10 MED ORDER — OSELTAMIVIR PHOSPHATE 30 MG PO CAPS: 30.0000 mg | ORAL_CAPSULE | Freq: Two times a day (BID) | ORAL | 0 refills | Status: DC

## 2017-05-10 MED ORDER — GUAIFENESIN-DM 100-10 MG/5ML PO SYRP: 10.0000 mL | ORAL_SOLUTION | ORAL | 0 refills | Status: DC | PRN

## 2017-05-10 MED ORDER — LOPERAMIDE HCL 2 MG PO CAPS: 2.0000 mg | ORAL_CAPSULE | ORAL | Status: DC | PRN

## 2017-05-10 MED ORDER — ALBUTEROL SULFATE HFA 108 (90 BASE) MCG/ACT IN AERS: 2.0000 | INHALATION_SPRAY | RESPIRATORY_TRACT | 0 refills | Status: DC | PRN

## 2017-05-10 MED ORDER — MIRABEGRON ER 25 MG PO TB24: 25.0000 mg | ORAL_TABLET | Freq: Every day | ORAL | 0 refills | Status: DC

## 2017-05-10 MED ORDER — WARFARIN SODIUM 2 MG PO TABS
2.0000 mg | ORAL_TABLET | Freq: Once | ORAL | Status: AC
  Administered 2017-05-10: 2 mg via ORAL
  Filled 2017-05-10: qty 1

## 2017-05-10 MED ORDER — WARFARIN SODIUM 3 MG PO TABS: 4.5000 mg | ORAL_TABLET | Freq: Every day | ORAL | Status: DC

## 2017-05-10 MED ORDER — MIRABEGRON ER 25 MG PO TB24
25.0000 mg | ORAL_TABLET | Freq: Every day | ORAL | Status: DC
  Administered 2017-05-10: 25 mg via ORAL
  Filled 2017-05-10: qty 1

## 2017-05-10 MED ORDER — POTASSIUM CHLORIDE CRYS ER 20 MEQ PO TBCR
40.0000 meq | EXTENDED_RELEASE_TABLET | ORAL | Status: AC
  Administered 2017-05-10 (×2): 40 meq via ORAL
  Filled 2017-05-10 (×2): qty 2

## 2017-05-10 MED ORDER — LORAZEPAM 0.5 MG PO TABS
0.5000 mg | ORAL_TABLET | Freq: Once | ORAL | Status: AC
  Administered 2017-05-10: 0.5 mg via ORAL
  Filled 2017-05-10: qty 1

## 2017-05-10 MED ORDER — LOPERAMIDE HCL 2 MG PO CAPS
2.0000 mg | ORAL_CAPSULE | ORAL | Status: AC | PRN
  Administered 2017-05-10: 2 mg via ORAL
  Filled 2017-05-10: qty 1

## 2017-05-10 MED ORDER — BUDESONIDE 0.25 MG/2ML IN SUSP: 0.2500 mg | Freq: Two times a day (BID) | RESPIRATORY_TRACT | 12 refills | Status: DC

## 2017-05-10 MED ORDER — PREDNISONE 10 MG PO TABS: ORAL_TABLET | ORAL | 0 refills | Status: DC

## 2017-05-10 NOTE — Progress Notes (Signed)
ANTICOAGULATION CONSULT NOTE - Follow-Up Consult  Pharmacy Consult for Warfarin Indication: atrial fibrillation  Allergies  Allergen Reactions  . Amiodarone Other (See Comments)    PULMONARY TOXICITY  . Diovan [Valsartan] Other (See Comments)    HYPOTENSION  . Doxycycline Diarrhea and Other (See Comments)    VISUAL DISTURBANCE  . Flexeril [Cyclobenzaprine] Other (See Comments)    FATIGUE  . Keflex [Cephalexin] Diarrhea  . Verapamil Other (See Comments)    EDEMA  . Codeine Other (See Comments)    unknown    Patient Measurements: Height: 5\' 3"  (160 cm) Weight: 211 lb 11.2 oz (96 kg) IBW/kg (Calculated) : 52.4   Vital Signs: Temp: 97.5 F (36.4 C) (02/26 0417) Temp Source: Oral (02/26 0417) BP: 134/75 (02/26 0417) Pulse Rate: 116 (02/26 0417)  Labs: Recent Labs    05/07/17 2155 05/08/17 0149 05/08/17 7026 05/08/17 1218 05/09/17 0319 05/09/17 0740 05/10/17 0507  HGB 15.7*  --  13.9  --   --   --   --   HCT 48.3*  --  43.4  --   --   --   --   PLT 185  --  170  --   --   --   --   LABPROT  --  29.1*  --   --   --  40.2* 33.3*  INR  --  2.78  --   --   --  4.20* 3.30  CREATININE 1.58*  --  1.45*  --  1.22*  --  1.15*  TROPONINI  --  0.76* 0.89* 0.76*  --   --   --     Estimated Creatinine Clearance: 46.6 mL/min (A) (by C-G formula based on SCr of 1.15 mg/dL (H)).   Medical History: Past Medical History:  Diagnosis Date  . Atrial fibrillation ([place])   . Migraine headache   . Osteopenia     Medications:  Scheduled:  . arformoterol  15 mcg Nebulization BID  . budesonide (PULMICORT) nebulizer solution  0.5 mg Nebulization BID  . digoxin  0.125 mg Oral Daily  . diltiazem  120 mg Oral Daily  . gabapentin  300 mg Oral TID  . ipratropium-albuterol  3 mL Nebulization Q6H  . olopatadine  1 drop Both Eyes BID  . oseltamivir  30 mg Oral BID  . polyvinyl alcohol  1 drop Both Eyes QID  . potassium chloride  40 mEq Oral Q4H  . prednisoLONE acetate  1 drop Both  Eyes BID  . Warfarin - Pharmacist Dosing Inpatient   Does not apply q1800   Infusions:    Assessment: Katie [name] on chronic warfarin for A-fib admitted with septic shock. Home dose reported as 4.5 mg daily, last take 2/23 0800. INR on admission is therapeutic at 2.78    Today, 05/10/17   INR 3.3, SUPRAtherapeutic but dropped since yesterday  Hgb 13.9, plt 170 on 2/24  No bleeding charted  Goal of Therapy:  INR 2-3   Plan:   Will give only 2mg  warfarin this PM  Daily PT/INR  Monitor for signs and symptoms of bleeding   Adrian Saran, PharmD, BCPS Pager [phone] 05/10/2017 8:55 AM

## 2017-05-10 NOTE — Progress Notes (Signed)
Educated patient on medication and discharge instructions with daughter [name] at bedside.  Pt and daughter both verbalized understanding of all teaching. IV out and pt to pick up meds from [facility] pharmacy.

## 2017-05-10 NOTE — Evaluation (Signed)
Clinical/Bedside Swallow Evaluation Patient Details  Name: Katie [name] MRN: [id number] Date of Birth: [birth year]  Today's Date: 05/10/2017 Time: SLP Start Time (ACUTE ONLY): 8502 SLP Stop Time (ACUTE ONLY): 1433 SLP Time Calculation (min) (ACUTE ONLY): 11 min  Past Medical History:  Past Medical History:  Diagnosis Date  . Atrial fibrillation ([place])   . Migraine headache   . Osteopenia    Past Surgical History:  Past Surgical History:  Procedure Laterality Date  . ABDOMINAL AORTIC ENDOVASCULAR STENT GRAFT N/A 04/11/2013   Procedure: ABDOMINAL AORTIC ENDOVASCULAR STENT GRAFT WITH RIGHT FEMORAL PATCH ANGIOPLASTY;  Surgeon: Mal Misty, MD;  Location: [facility];  Service: Vascular;  Laterality: N/A;  . BREAST SURGERY     LEFT BREAST BIOPSY  . broken leg Left 1970s  . CARDIAC CATHETERIZATION    . CHOLECYSTECTOMY  1972  . COLONOSCOPY    . EYE SURGERY Bilateral   . Laser vein procedure    . REFRACTIVE SURGERY    . TONSILLECTOMY     HPI:  77 yo female admitted with septic shock, +flu. Hx of A fib, CHF, CKD, COPD, CVA, hypothyroidism, CAD, pulmonary fibrosis, aoritic aneurysm.  Pt seen by SLP services 07/15/16 - at that time, pt reported globus sensation and occasional choking; she was D/Cd on regular diet/thin liquids.  Currently, pt describes globus, "choking" sensation (which means globus when she describes it more precisely); early satiety but no weight loss.    Assessment / Plan / Recommendation Clinical Impression  Pt presents with normal oropharyngeal swallow, but her s/s are consistent with a primary esophageal dysphagia - belching, globus, early satiety.  She would benefit from an esophageal assessment at some point, as these symptoms have been occurring for some time.  This work-up could be completed as an outpatient.  No SLP needs are identified - continue current diet.  D/W RN. Our services will sign off.  SLP Visit Diagnosis: Dysphagia, unspecified (R13.10)    Aspiration  Risk  No limitations    Diet Recommendation   regular solids, thin liquids  Medication Administration: Whole meds with liquid    Other  Recommendations Recommended Consults: Consider esophageal assessment   Follow up Recommendations        Frequency and Duration            Prognosis        Swallow Study   General HPI: 77 yo female admitted with septic shock, +flu. Hx of A fib, CHF, CKD, COPD, CVA, hypothyroidism, CAD, pulmonary fibrosis, aoritic aneurysm.  Pt seen by SLP services 07/15/16 - at that time, pt reported globus sensation and occasional choking; she was D/Cd on regular diet/thin liquids.  Currently, pt describes globus, "choking" sensation (which means globus when she describes it more precisely); early satiety but no weight loss.  Type of Study: Bedside Swallow Evaluation Previous Swallow Assessment: see HPI Diet Prior to this Study: Regular;Thin liquids Temperature Spikes Noted: No Respiratory Status: Room air History of Recent Intubation: No Behavior/Cognition: Alert Oral Cavity Assessment: Within Functional Limits Oral Care Completed by SLP: No Oral Cavity - Dentition: Dentures, bottom;Dentures, top Vision: Functional for self-feeding Self-Feeding Abilities: Able to feed self Patient Positioning: Upright in chair Baseline Vocal Quality: Hoarse Volitional Cough: Strong Volitional Swallow: Able to elicit    Oral/Motor/Sensory Function Overall Oral Motor/Sensory Function: Within functional limits   Ice Chips Ice chips: Within functional limits   Thin Liquid Thin Liquid: Within functional limits    Nectar Thick Nectar Thick Liquid: Not  tested   Honey Thick Honey Thick Liquid: Not tested   Puree Puree: Within functional limits   Solid   GO   Solid: Within functional limits        Juan Quam Laurice 05/10/2017,2:37 PM

## 2017-05-10 NOTE — Plan of Care (Signed)
  Progressing Elimination: Will not experience complications related to bowel motility 05/10/2017 0100 - Progressing by Talbert Forest, RN Will not experience complications related to urinary retention 05/10/2017 0100 - Progressing by Talbert Forest, RN Safety: Ability to remain free from injury will improve 05/10/2017 0100 - Progressing by Talbert Forest, RN Skin Integrity: Risk for impaired skin integrity will decrease 05/10/2017 0100 - Progressing by Talbert Forest, RN

## 2017-05-10 NOTE — Progress Notes (Deleted)
Physician Discharge Summary  Katie [name]  MCE:[id number]  DOB: 05/31/1940  DOA: 05/07/2017 PCP: [name], MD  Admit date: 05/07/2017 Discharge date: 05/10/2017  Admitted From: Home Disposition: Home  Recommendations for Outpatient Follow-up:  1. Follow up with PCP in 1-2 weeks 2. Recommend outpatient esophageal evaluation - pt with esophageal dysphagia 3. Please obtain BMP in one week to monitor creatinine and potassium 4. Please follow up on the following pending results: Blood culture pending so far negative  Home Health: PT/OT  Discharge Condition: Stable CODE STATUS: Full code Diet recommendation: Heart Healthy   Brief/Interim Summary: For full details see H&P/Progress note, but in brief, Katie [name] is a 77 year old female with medical history of A. fib, CHF, hypertension, CKD, COPD, hypothyroidism and history of migraine who presented to the emergency department with cough, associated with shortness of breath, confusion and lethargy.  In the ER he was found to be in A. fib with RVR and hypotensive shock as well febrile with imperative of 102.3.  Influenza A positive. Patient was started on IV fluids and empiric antibiotic and she was admitted to the ICU due to septic shock not responding to IV fluid.  Subsequently started on levophed, which eventually was weaned off, and patient blood pressure was normalized.  Patient also received Cardizem drip for a short period time due to hypotension.  Patient breathing significantly improved, A. fib is well controlled and confusion has resolved.  Renal function has come back to baseline.  This point patient remains afebrile, receiving Tamiflu and is being clinically stable for discharge.  Subjective: Patient seen and examined, report feeling weak, PT has evaluated patient and recommended home health PT, complaining of having difficulty with swallowing.  Patient has a history of hiatal hernia, swallow eval was done suspecting  esophageal dysphagia.  Discharge Diagnoses/Hospital Course:  Septic shock due to  influenza A Continue Tamiflu for 2 more days Blood cultures no growth so far Patient was initially treated with vancomycin and Zosyn no signs of bacterial infection we will discontinue antibiotics.  Acute metabolic encephalopathy Due to infectious process Resolved Continue to treat underlying causes  Chronic A. fib with RVR - stable Due to infectious process Initially treated with Cardizem drip which has been transitioned to oral diltiazem.   Home medications were continued including Cardizem and digoxin. Anticoagulation with Coumadin which was held due to elevated INR Will hold Coumadin tonight and resume home medication tomorrow.  Elevated troponin Felt to be secondary to demand ischemia No ischemic changes on EKG, old LBBB No further cardiac workup  AKI on CKD stage III -resolved Prerenal Treated with IV fluid, creatinine now back to baseline. Check BMP in 1 week  Chronic diastolic CHF No signs of fluid overload Echocardiogram with EF 45%, no significant changes from previous Follow-up with PCP  Hypertension BP stable Continue home regimen without any changes   All other chronic medical condition were stable during the hospitalization.  Patient was seen by physical therapy, recommending home health PT On the day of the discharge the patient's vitals were stable, and no other acute medical condition were reported by patient. the patient was felt safe to be discharge to home  Discharge Instructions  You were cared for by a hospitalist during your hospital stay. If you have any questions about your discharge medications or the care you received while you were in the hospital after you are discharged, you can call the unit and asked to speak with the hospitalist on call if  the hospitalist that took care of you is not available. Once you are discharged, your primary care physician will  handle any further medical issues. Please note that NO REFILLS for any discharge medications will be authorized once you are discharged, as it is imperative that you return to your primary care physician (or establish a relationship with a primary care physician if you do not have one) for your aftercare needs so that they can reassess your need for medications and monitor your lab values.  Discharge Instructions    Call MD for:  difficulty breathing, headache or visual disturbances   Complete by:  As directed    Call MD for:  extreme fatigue   Complete by:  As directed    Call MD for:  hives   Complete by:  As directed    Call MD for:  persistant dizziness or light-headedness   Complete by:  As directed    Call MD for:  persistant nausea and vomiting   Complete by:  As directed    Call MD for:  redness, tenderness, or signs of infection (pain, swelling, redness, odor or green/yellow discharge around incision site)   Complete by:  As directed    Call MD for:  severe uncontrolled pain   Complete by:  As directed    Call MD for:  temperature >100.4   Complete by:  As directed    Diet - low sodium heart healthy   Complete by:  As directed    Increase activity slowly   Complete by:  As directed      Allergies as of 05/10/2017      Reactions   Amiodarone Other (See Comments)   PULMONARY TOXICITY   Diovan [valsartan] Other (See Comments)   HYPOTENSION   Doxycycline Diarrhea, Other (See Comments)   VISUAL DISTURBANCE   Flexeril [cyclobenzaprine] Other (See Comments)   FATIGUE   Keflex [cephalexin] Diarrhea   Verapamil Other (See Comments)   EDEMA   Codeine Other (See Comments)   unknown      Medication List    STOP taking these medications   azithromycin 250 MG tablet Commonly known as:  ZITHROMAX   cetirizine 10 MG tablet Commonly known as:  ZYRTEC   predniSONE 20 MG tablet Commonly known as:  DELTASONE   promethazine-dextromethorphan 6.25-15 MG/5ML syrup Commonly  known as:  PROMETHAZINE-DM   triamcinolone cream 0.1 % Commonly known as:  KENALOG     TAKE these medications   acetaminophen 500 MG tablet Commonly known as:  TYLENOL Take 1,000 mg by mouth daily.   albuterol 108 (90 Base) MCG/ACT inhaler Commonly known as:  PROVENTIL HFA;VENTOLIN HFA Inhale 2 puffs into the lungs every 4 (four) hours as needed for shortness of breath. Inhale 2 puffs 5-10 min apart every 4 to 6 hrs- to rescue asthma What changed:  additional instructions   allopurinol 300 MG tablet Commonly known as:  ZYLOPRIM Take 300 mg by mouth daily.   aspirin EC 81 MG tablet Take 81 mg by mouth daily.   budesonide 0.25 MG/2ML nebulizer solution Commonly known as:  PULMICORT Take 2 mLs (0.25 mg total) by nebulization 2 (two) times daily. What changed:    when to take this  reasons to take this   digoxin 0.125 MG tablet Commonly known as:  LANOXIN Take 1 tablet daily for Heart Rhythm What changed:    how much to take  how to take this  when to take this  additional instructions  diltiazem 120 MG 24 hr capsule Commonly known as:  TIAZAC TAKE 1 CAPSULE BY MOUTH EVERY DAY   furosemide 80 MG tablet Commonly known as:  LASIX TAKE 1 TABLET BY MOUTH TWICE A DAY AS NEEDED FOR FLUID What changed:    how much to take  how to take this  when to take this  additional instructions   gabapentin 600 MG tablet Commonly known as:  NEURONTIN Takes 1/2 to 1 tablet 3 times daily. What changed:    how much to take  how to take this  when to take this  additional instructions   guaiFENesin-dextromethorphan 100-10 MG/5ML syrup Commonly known as:  ROBITUSSIN DM Take 10 mLs by mouth every 4 (four) hours as needed for cough.   levothyroxine 50 MCG tablet Commonly known as:  SYNTHROID, LEVOTHROID TAKE 1 TABLET BY MOUTH EVERY DAY What changed:    how much to take  how to take this  when to take this   mirabegron ER 25 MG Tb24 tablet Commonly known  as:  MYRBETRIQ Take 1 tablet (25 mg total) by mouth daily. Start taking on:  05/11/2017   montelukast 10 MG tablet Commonly known as:  SINGULAIR TAKE 1 TABLET EVERY DAY FOR ALLERGIES   Olopatadine HCl 0.2 % Soln Place 1 drop into both eyes daily.   oseltamivir 30 MG capsule Commonly known as:  TAMIFLU Take 1 capsule (30 mg total) by mouth 2 (two) times daily for 5 doses.   pantoprazole 40 MG tablet Commonly known as:  PROTONIX Take 1 tablet (40 mg total) by mouth daily.   potassium chloride SA 20 MEQ tablet Commonly known as:  KLOR-CON M20 TAKE 2 TABLETS (40 MEQ TOTAL) BY MOUTH DAILY. What changed:    how much to take  how to take this  when to take this  additional instructions   pravastatin 40 MG tablet Commonly known as:  PRAVACHOL TAKE 1 TABLET BY MOUTH AT BEDTIME FOR CHOLESTEROL   prednisoLONE acetate 1 % ophthalmic suspension Commonly known as:  PRED FORTE INSTILL 1 DROP INTO BOTH EYES FOUR TIMES A DAY FOR 10 DAYS AND THEN TWICE DAY.   prochlorperazine 5 MG tablet Commonly known as:  COMPAZINE TAKE 1 TABLET BY MOUTH THREE TIMES A DAY AS NEEDED FOR VERTIGO OR NAUSEA   SYSTANE OP Apply 1 drop to eye 4 (four) times daily.   Vitamin D3 5000 units Caps Takes 2 caps (10,000 units) daily What changed:    how much to take  how to take this  when to take this  additional instructions   warfarin 3 MG tablet Commonly known as:  COUMADIN Take 1.5 tablets (4.5 mg total) by mouth daily. Start taking on:  05/11/2017      Follow-up Information    [name], MD. Schedule an appointment as soon as possible for a visit in 1 week(s).   Specialty:  Internal Medicine Why:  Hospital follow-up Contact information: [address] [place] [address] [phone]          Allergies  Allergen Reactions  . Amiodarone Other (See Comments)    PULMONARY TOXICITY  . Diovan [Valsartan] Other (See Comments)    HYPOTENSION  . Doxycycline  Diarrhea and Other (See Comments)    VISUAL DISTURBANCE  . Flexeril [Cyclobenzaprine] Other (See Comments)    FATIGUE  . Keflex [Cephalexin] Diarrhea  . Verapamil Other (See Comments)    EDEMA  . Codeine Other (See Comments)    unknown  Consultations:  None    Procedures/Studies: Dg Chest Portable 1 View  Result Date: 05/07/2017 CLINICAL DATA:  Shortness of breath, lethargy. EXAM: PORTABLE CHEST 1 VIEW COMPARISON:  Chest radiograph Jul 14, 2016 FINDINGS: Cardiac silhouette is moderate to severely enlarged and unchanged. Calcified aortic knob. Interstitial prominence, decreased from prior examination without pleural effusion or focal consolidation. No pneumothorax. Soft tissue planes and included osseous structures are non suspicious. IMPRESSION: Moderate to severe cardiomegaly. Interstitial prominence most compatible with pulmonary edema. No focal consolidation. Electronically Signed   By: Elon Alas M.D.   On: 05/07/2017 22:19    Discharge Exam: Vitals:   05/10/17 1413 05/10/17 1505  BP: 127/82   Pulse: 100   Resp: 18   Temp: 98.6 F (37 C)   SpO2: 91% 95%   Vitals:   05/10/17 0500 05/10/17 0828 05/10/17 1413 05/10/17 1505  BP:   127/82   Pulse:   100   Resp:   18   Temp:   98.6 F (37 C)   TempSrc:   Oral   SpO2:  91% 91% 95%  Weight: 96 kg (211 lb 11.2 oz)     Height:        General: Pt is alert, awake, not in acute distress Cardiovascular: RRR, S1/S2 +, no rubs, no gallops Respiratory: Good air entry, slight expiratory wheezing at upper lobes b/l  Abdominal: Soft, NT, ND, bowel sounds + Extremities: no edema   The results of significant diagnostics from this hospitalization (including imaging, microbiology, ancillary and laboratory) are listed below for reference.     Microbiology: Recent Results (from the past 240 hour(s))  Blood culture (routine x 2)     Status: None (Preliminary result)   Collection Time: 05/07/17  9:55 PM  Result Value Ref  Range Status   Specimen Description   Final    BLOOD RIGHT FOREARM Performed at [facility], [place] [address] [place], [place] [address]    Special Requests   Final    BOTTLES DRAWN AEROBIC AND ANAEROBIC Blood Culture adequate volume Performed at [facility] [address], [place], [place] [address]    Culture   Final    NO GROWTH 3 DAYS Performed at [facility], [place] [address], [place], [place] [address]    Report Status PENDING  Incomplete  Blood culture (routine x 2)     Status: None (Preliminary result)   Collection Time: 05/07/17 10:03 PM  Result Value Ref Range Status   Specimen Description   Final    BLOOD RIGHT HAND Performed at [facility] [address] [place], [place] [address]    Special Requests   Final    BOTTLES DRAWN AEROBIC AND ANAEROBIC Blood Culture adequate volume Performed at [facility] [address], [place], [place] [address]    Culture   Final    NO GROWTH 3 DAYS Performed at [facility], [place] [address], [place], [place] [address]    Report Status PENDING  Incomplete  MRSA PCR Screening     Status: None   Collection Time: 05/08/17  4:26 AM  Result Value Ref Range Status   MRSA by PCR NEGATIVE NEGATIVE Final    Comment:        The GeneXpert MRSA Assay (FDA approved for NASAL specimens only), is one component of a comprehensive MRSA colonization surveillance program. It is not intended to diagnose MRSA infection nor to guide or monitor  treatment for MRSA infections. Performed at [facility], [place] [address], [place], [place] [address]      Labs: BNP (last 3 results) Recent Labs    06/02/16 0100 06/23/16 1144 07/07/16 1512  BNP 130.7* 57.8 10.6   Basic Metabolic Panel: Recent Labs  Lab 05/07/17 2155 05/08/17 0149 05/08/17 0656 05/09/17 0319 05/10/17 0507  NA 139  --  137  --  139  K 4.0  --  3.9  --  3.4*  CL  99*  --  103  --  105  CO2 26  --  21*  --  22  GLUCOSE 152*  --  231*  --  104*  BUN 22*  --  20  --  22*  CREATININE 1.58*  --  1.45* 1.22* 1.15*  CALCIUM 9.0  --  7.8*  --  8.8*  MG  --  2.0 2.1  --   --   PHOS  --  3.2 2.3*  --   --    Liver Function Tests: No results for input(s): AST, ALT, ALKPHOS, BILITOT, PROT, ALBUMIN in the last 168 hours. No results for input(s): LIPASE, AMYLASE in the last 168 hours. No results for input(s): AMMONIA in the last 168 hours. CBC: Recent Labs  Lab 05/07/17 2155 05/08/17 0656  WBC 12.5* 7.7  NEUTROABS 8.9*  --   HGB 15.7* 13.9  HCT 48.3* 43.4  MCV 94.0 94.6  PLT 185 170   Cardiac Enzymes: Recent Labs  Lab 05/08/17 0149 05/08/17 0656 05/08/17 1218  TROPONINI 0.76* 0.89* 0.76*   BNP: Invalid input(s): POCBNP CBG: No results for input(s): GLUCAP in the last 168 hours. D-Dimer No results for input(s): DDIMER in the last 72 hours. Hgb A1c No results for input(s): HGBA1C in the last 72 hours. Lipid Profile No results for input(s): CHOL, HDL, LDLCALC, TRIG, CHOLHDL, LDLDIRECT in the last 72 hours. Thyroid function studies No results for input(s): TSH, T4TOTAL, T3FREE, THYROIDAB in the last 72 hours.  Invalid input(s): FREET3 Anemia work up No results for input(s): VITAMINB12, FOLATE, FERRITIN, TIBC, IRON, RETICCTPCT in the last 72 hours. Urinalysis    Component Value Date/Time   COLORURINE YELLOW 05/08/2017 0028   APPEARANCEUR HAZY (A) 05/08/2017 0028   LABSPEC 1.018 05/08/2017 0028   PHURINE 7.0 05/08/2017 0028   GLUCOSEU NEGATIVE 05/08/2017 0028   HGBUR NEGATIVE 05/08/2017 0028   BILIRUBINUR NEGATIVE 05/08/2017 0028   KETONESUR NEGATIVE 05/08/2017 0028   PROTEINUR NEGATIVE 05/08/2017 0028   UROBILINOGEN 0.2 05/08/2014 1232   NITRITE NEGATIVE 05/08/2017 0028   LEUKOCYTESUR NEGATIVE 05/08/2017 0028   Sepsis Labs Invalid input(s): PROCALCITONIN,  WBC,  LACTICIDVEN Microbiology Recent Results (from the past 240  hour(s))  Blood culture (routine x 2)     Status: None (Preliminary result)   Collection Time: 05/07/17  9:55 PM  Result Value Ref Range Status   Specimen Description   Final    BLOOD RIGHT FOREARM Performed at [facility], [place] [address] [place], [place] [address]    Special Requests   Final    BOTTLES DRAWN AEROBIC AND ANAEROBIC Blood Culture adequate volume Performed at [facility] [address] [place], [place] [address]    Culture   Final    NO GROWTH 3 DAYS Performed at [facility], [place] [address] [place], [place] [address]    Report Status PENDING  Incomplete  Blood culture (routine x 2)     Status: None (Preliminary  result)   Collection Time: 05/07/17 10:03 PM  Result Value Ref Range Status   Specimen Description   Final    BLOOD RIGHT HAND Performed at [facility] [address], [place], [place] [address]    Special Requests   Final    BOTTLES DRAWN AEROBIC AND ANAEROBIC Blood Culture adequate volume Performed at [facility] [address], [place], [place] [address]    Culture   Final    NO GROWTH 3 DAYS Performed at [facility], [place] [address], [place], [place] [address]    Report Status PENDING  Incomplete  MRSA PCR Screening     Status: None   Collection Time: 05/08/17  4:26 AM  Result Value Ref Range Status   MRSA by PCR NEGATIVE NEGATIVE Final    Comment:        The GeneXpert MRSA Assay (FDA approved for NASAL specimens only), is one component of a comprehensive MRSA colonization surveillance program. It is not intended to diagnose MRSA infection nor to guide or monitor treatment for MRSA infections. Performed at [facility], [place] [address], [place],  [address]     Time coordinating discharge: 32 minutes  SIGNED:  Chipper Oman, MD  [facility] 05/10/2017, 3:24 PM  Pager please text page via   [email or web address]  Note - This record has been created using [employer]. Chart creation errors have been sought, but may not always have been located. Such creation errors do not reflect on the standard of medical care.

## 2017-05-10 NOTE — Care Management Important Message (Signed)
Important Message  Patient Details  Name: Katie [name] MRN: [id number] Date of Birth: October 22, 1940   Medicare Important Message Given:  Yes  Pt at this time refused to sign receipt of IM and refused to sign anything until after the daughter had further answers.  Copy left with pt and pt's daughter.  Advised that the same form was signed on arrival to the hospital and this is a reminder of rights as a medicare recipient.  CM and primary RN at beside advised that Dr Quincy Simmonds would be paged to come speak with pt and daughter.  Jaidence Geisler, Benjaman Lobe, RN 05/10/2017, 5:16 PM

## 2017-05-10 NOTE — Care Management Note (Signed)
Case Management Note  Patient Details  Name: Katie [name] MRN: [id number] Date of Birth: January 21, 1941  Subjective/Objective:                    Action/Plan:Pt discharging with [place] for [place]   Expected Discharge Date:  05/10/17               Expected Discharge Plan:  [place]  In-House Referral:     Discharge planning Services  CM Consult  Post Acute Care Choice:    Choice offered to:     DME Arranged:    DME Agency:     HH Arranged:  PT HH Agency:  [facility]  Status of Service:  In process, will continue to follow  If discussed at Long Length of Stay Meetings, dates discussed:    Additional CommentsPurcell Mouton, RN 05/10/2017, 3:32 PM

## 2017-05-10 NOTE — Discharge Summary (Signed)
Physician Discharge Summary  Katie [name]  QIO:[id number]  DOB: [birth year]  DOA: 05/07/2017 PCP: [name], MD  Admit date: 05/07/2017 Discharge date: 05/10/2017  Admitted From: Home Disposition: Home  Recommendations for Outpatient Follow-up:  1. Follow up with PCP in 1-2 weeks 2. Recommend outpatient esophageal evaluation - pt with esophageal dysphagia 3. Please obtain BMP in one week to monitor creatinine and potassium 4. Please follow up on the following pending results: Blood culture pending so far negative  Home Health: PT/OT  Discharge Condition: Stable CODE STATUS: Full code Diet recommendation: Heart Healthy   Brief/Interim Summary: For full details see H&P/Progress note, but in brief, Katie [name] is a 77 year old female with medical history of A. fib, CHF, hypertension, CKD, COPD, hypothyroidism and history of migraine who presented to the emergency department with cough, associated with shortness of breath, confusion and lethargy.  In the ER he was found to be in A. fib with RVR and hypotensive shock as well febrile with imperative of 102.3.  Influenza A positive. Patient was started on IV fluids and empiric antibiotic and she was admitted to the ICU due to septic shock not responding to IV fluid.  Subsequently started on levophed, which eventually was weaned off, and patient blood pressure was normalized.  Patient also received Cardizem drip for a short period time due to hypotension.  Patient breathing significantly improved, A. fib is well controlled and confusion has resolved.  Renal function has come back to baseline.  This point patient remains afebrile, receiving Tamiflu and is being clinically stable for discharge.  Subjective: Patient seen and examined, report feeling weak, PT has evaluated patient and recommended home health PT, complaining of having difficulty with swallowing.  Patient has a history of hiatal hernia, swallow eval was done suspecting  esophageal dysphagia.  Discharge Diagnoses/Hospital Course:  Septic shock due to influenza A / Acute respiratory failure with hypoxia  Continue Tamiflu for 2 more days Blood cultures no growth so far Was treated with pressor for short period of time  Patient was initially treated with vancomycin and Zosyn no signs of bacterial infection we will discontinue antibiotics. Will send on Prednisone taper for 14 days  Successfully weaned of oxigen Follow up with PCP   Acute metabolic encephalopathy Due to infectious process and hypoxia  Resolved Continue to treat underlying causes  Chronic A. fib with RVR - stable Due to infectious process Initially treated with Cardizem drip which has been transitioned to oral diltiazem.   Home medications were continued including Cardizem and digoxin. Anticoagulation with Coumadin which was held due to elevated INR Will hold Coumadin tonight and resume home medication tomorrow.  Elevated troponin Felt to be secondary to demand ischemia No ischemic changes on EKG, old LBBB No further cardiac workup  AKI on CKD stage III -resolved Prerenal Treated with IV fluid, creatinine now back to baseline. Check BMP in 1 week  Chronic diastolic CHF No signs of fluid overload Echocardiogram with EF 45%, no significant changes from previous Follow-up with PCP  Hypertension BP stable Continue home regimen without any changes   All other chronic medical condition were stable during the hospitalization.  Patient was seen by physical therapy, recommending home health PT On the day of the discharge the patient's vitals were stable, and no other acute medical condition were reported by patient. the patient was felt safe to be discharge to home  Discharge Instructions  You were cared for by a hospitalist during your hospital stay. If you  have any questions about your discharge medications or the care you received while you were in the hospital after you  are discharged, you can call the unit and asked to speak with the hospitalist on call if the hospitalist that took care of you is not available. Once you are discharged, your primary care physician will handle any further medical issues. Please note that NO REFILLS for any discharge medications will be authorized once you are discharged, as it is imperative that you return to your primary care physician (or establish a relationship with a primary care physician if you do not have one) for your aftercare needs so that they can reassess your need for medications and monitor your lab values.  Discharge Instructions    Call MD for:  difficulty breathing, headache or visual disturbances   Complete by:  As directed    Call MD for:  extreme fatigue   Complete by:  As directed    Call MD for:  hives   Complete by:  As directed    Call MD for:  persistant dizziness or light-headedness   Complete by:  As directed    Call MD for:  persistant nausea and vomiting   Complete by:  As directed    Call MD for:  redness, tenderness, or signs of infection (pain, swelling, redness, odor or green/yellow discharge around incision site)   Complete by:  As directed    Call MD for:  severe uncontrolled pain   Complete by:  As directed    Call MD for:  temperature >100.4   Complete by:  As directed    Diet - low sodium heart healthy   Complete by:  As directed    Increase activity slowly   Complete by:  As directed      Allergies as of 05/10/2017      Reactions   Amiodarone Other (See Comments)   PULMONARY TOXICITY   Diovan [valsartan] Other (See Comments)   HYPOTENSION   Doxycycline Diarrhea, Other (See Comments)   VISUAL DISTURBANCE   Flexeril [cyclobenzaprine] Other (See Comments)   FATIGUE   Keflex [cephalexin] Diarrhea   Verapamil Other (See Comments)   EDEMA   Codeine Other (See Comments)   unknown      Medication List    STOP taking these medications   azithromycin 250 MG tablet Commonly  known as:  ZITHROMAX   cetirizine 10 MG tablet Commonly known as:  ZYRTEC   promethazine-dextromethorphan 6.25-15 MG/5ML syrup Commonly known as:  PROMETHAZINE-DM   triamcinolone cream 0.1 % Commonly known as:  KENALOG     TAKE these medications   acetaminophen 500 MG tablet Commonly known as:  TYLENOL Take 1,000 mg by mouth daily.   albuterol 108 (90 Base) MCG/ACT inhaler Commonly known as:  PROVENTIL HFA;VENTOLIN HFA Inhale 2 puffs into the lungs every 4 (four) hours as needed for shortness of breath. 2 puffs 4- 6 hrs to rescue asthma What changed:  additional instructions   allopurinol 300 MG tablet Commonly known as:  ZYLOPRIM Take 300 mg by mouth daily.   aspirin EC 81 MG tablet Take 81 mg by mouth daily.   budesonide 0.25 MG/2ML nebulizer solution Commonly known as:  PULMICORT Take 2 mLs (0.25 mg total) by nebulization 2 (two) times daily. What changed:    when to take this  reasons to take this   digoxin 0.125 MG tablet Commonly known as:  LANOXIN Take 1 tablet daily for Heart Rhythm What changed:  how much to take  how to take this  when to take this  additional instructions   diltiazem 120 MG 24 hr capsule Commonly known as:  TIAZAC TAKE 1 CAPSULE BY MOUTH EVERY DAY   furosemide 80 MG tablet Commonly known as:  LASIX TAKE 1 TABLET BY MOUTH TWICE A DAY AS NEEDED FOR FLUID What changed:    how much to take  how to take this  when to take this  additional instructions   gabapentin 600 MG tablet Commonly known as:  NEURONTIN Takes 1/2 to 1 tablet 3 times daily. What changed:    how much to take  how to take this  when to take this  additional instructions   guaiFENesin-dextromethorphan 100-10 MG/5ML syrup Commonly known as:  ROBITUSSIN DM Take 10 mLs by mouth every 4 (four) hours as needed for cough.   levothyroxine 50 MCG tablet Commonly known as:  SYNTHROID, LEVOTHROID TAKE 1 TABLET BY MOUTH EVERY DAY What changed:     how much to take  how to take this  when to take this   mirabegron ER 25 MG Tb24 tablet Commonly known as:  MYRBETRIQ Take 1 tablet (25 mg total) by mouth daily. Start taking on:  05/11/2017   montelukast 10 MG tablet Commonly known as:  SINGULAIR TAKE 1 TABLET EVERY DAY FOR ALLERGIES   Olopatadine HCl 0.2 % Soln Place 1 drop into both eyes daily.   oseltamivir 30 MG capsule Commonly known as:  TAMIFLU Take 1 capsule (30 mg total) by mouth 2 (two) times daily for 5 doses.   pantoprazole 40 MG tablet Commonly known as:  PROTONIX Take 1 tablet (40 mg total) by mouth daily.   potassium chloride SA 20 MEQ tablet Commonly known as:  KLOR-CON M20 TAKE 2 TABLETS (40 MEQ TOTAL) BY MOUTH DAILY. What changed:    how much to take  how to take this  when to take this  additional instructions   pravastatin 40 MG tablet Commonly known as:  PRAVACHOL TAKE 1 TABLET BY MOUTH AT BEDTIME FOR CHOLESTEROL   prednisoLONE acetate 1 % ophthalmic suspension Commonly known as:  PRED FORTE INSTILL 1 DROP INTO BOTH EYES FOUR TIMES A DAY FOR 10 DAYS AND THEN TWICE DAY.   predniSONE 10 MG tablet Commonly known as:  DELTASONE Take 4 tablets for 3 days; Take 3 tablets for 4 days; Take 2 tablets for 3 days; Take 1 tablet for 4 days What changed:    medication strength  additional instructions   prochlorperazine 5 MG tablet Commonly known as:  COMPAZINE TAKE 1 TABLET BY MOUTH THREE TIMES A DAY AS NEEDED FOR VERTIGO OR NAUSEA   SYSTANE OP Apply 1 drop to eye 4 (four) times daily.   Vitamin D3 5000 units Caps Takes 2 caps (10,000 units) daily What changed:    how much to take  how to take this  when to take this  additional instructions   warfarin 3 MG tablet Commonly known as:  COUMADIN Take 1.5 tablets (4.5 mg total) by mouth daily. Start taking on:  05/11/2017      Follow-up Information    [name], MD. Schedule an appointment as soon as possible for a  visit in 1 week(s).   Specialty:  Internal Medicine Why:  Hospital follow-up Contact information: [address] [place] [id number] [phone]          Allergies  Allergen Reactions  . Amiodarone Other (See Comments)  PULMONARY TOXICITY  . Diovan [Valsartan] Other (See Comments)    HYPOTENSION  . Doxycycline Diarrhea and Other (See Comments)    VISUAL DISTURBANCE  . Flexeril [Cyclobenzaprine] Other (See Comments)    FATIGUE  . Keflex [Cephalexin] Diarrhea  . Verapamil Other (See Comments)    EDEMA  . Codeine Other (See Comments)    unknown    Consultations:  None    Procedures/Studies: Dg Chest Portable 1 View  Result Date: 05/07/2017 CLINICAL DATA:  Shortness of breath, lethargy. EXAM: PORTABLE CHEST 1 VIEW COMPARISON:  Chest radiograph Jul 14, 2016 FINDINGS: Cardiac silhouette is moderate to severely enlarged and unchanged. Calcified aortic knob. Interstitial prominence, decreased from prior examination without pleural effusion or focal consolidation. No pneumothorax. Soft tissue planes and included osseous structures are non suspicious. IMPRESSION: Moderate to severe cardiomegaly. Interstitial prominence most compatible with pulmonary edema. No focal consolidation. Electronically Signed   By: Elon Alas M.D.   On: 05/07/2017 22:19    Discharge Exam: Vitals:   05/10/17 1413 05/10/17 1505  BP: 127/82   Pulse: 100   Resp: 18   Temp: 98.6 F (37 C)   SpO2: 91% 95%   Vitals:   05/10/17 0500 05/10/17 0828 05/10/17 1413 05/10/17 1505  BP:   127/82   Pulse:   100   Resp:   18   Temp:   98.6 F (37 C)   TempSrc:   Oral   SpO2:  91% 91% 95%  Weight: 96 kg (211 lb 11.2 oz)     Height:        General: Pt is alert, awake, not in acute distress Cardiovascular: RRR, S1/S2 +, no rubs, no gallops Respiratory: Good air entry, slight expiratory wheezing at upper lobes b/l  Abdominal: Soft, NT, ND, bowel sounds + Extremities: no  edema   The results of significant diagnostics from this hospitalization (including imaging, microbiology, ancillary and laboratory) are listed below for reference.     Microbiology: Recent Results (from the past 240 hour(s))  Blood culture (routine x 2)     Status: None (Preliminary result)   Collection Time: 05/07/17  9:55 PM  Result Value Ref Range Status   Specimen Description   Final    BLOOD RIGHT FOREARM Performed at [facility], [place] [address] [place], [place] [address]    Special Requests   Final    BOTTLES DRAWN AEROBIC AND ANAEROBIC Blood Culture adequate volume Performed at [place] [address], [place], [place] [address]    Culture   Final    NO GROWTH 3 DAYS Performed at [facility], [place] [address] [place], [place] [address]    Report Status PENDING  Incomplete  Blood culture (routine x 2)     Status: None (Preliminary result)   Collection Time: 05/07/17 10:03 PM  Result Value Ref Range Status   Specimen Description   Final    BLOOD RIGHT HAND Performed at [place] [address], [place], [place] [address]    Special Requests   Final    BOTTLES DRAWN AEROBIC AND ANAEROBIC Blood Culture adequate volume Performed at [place] [address] [place], [place] [address]    Culture   Final    NO GROWTH 3 DAYS Performed at [facility], [place] [address], [place],  [address]    Report Status PENDING  Incomplete  MRSA PCR Screening     Status: None  Collection Time: 05/08/17  4:26 AM  Result Value Ref Range Status   MRSA by PCR NEGATIVE NEGATIVE Final    Comment:        The GeneXpert MRSA Assay (FDA approved for NASAL specimens only), is one component of a comprehensive MRSA colonization surveillance program. It is not intended to diagnose MRSA infection nor to guide or monitor treatment for MRSA infections. Performed at [facility], [place] [address], [place], [place] [address]      Labs: BNP (last 3 results) Recent Labs    06/02/16 0100 06/23/16 1144 07/07/16 1512  BNP 130.7* 57.8 73.7   Basic Metabolic Panel: Recent Labs  Lab 05/07/17 2155 05/08/17 0149 05/08/17 0656 05/09/17 0319 05/10/17 0507  NA 139  --  137  --  139  K 4.0  --  3.9  --  3.4*  CL 99*  --  103  --  105  CO2 26  --  21*  --  22  GLUCOSE 152*  --  231*  --  104*  BUN 22*  --  20  --  22*  CREATININE 1.58*  --  1.45* 1.22* 1.15*  CALCIUM 9.0  --  7.8*  --  8.8*  MG  --  2.0 2.1  --   --   PHOS  --  3.2 2.3*  --   --    Liver Function Tests: No results for input(s): AST, ALT, ALKPHOS, BILITOT, PROT, ALBUMIN in the last 168 hours. No results for input(s): LIPASE, AMYLASE in the last 168 hours. No results for input(s): AMMONIA in the last 168 hours. CBC: Recent Labs  Lab 05/07/17 2155 05/08/17 0656  WBC 12.5* 7.7  NEUTROABS 8.9*  --   HGB 15.7* 13.9  HCT 48.3* 43.4  MCV 94.0 94.6  PLT 185 170   Cardiac Enzymes: Recent Labs  Lab 05/08/17 0149 05/08/17 0656 05/08/17 1218  TROPONINI 0.76* 0.89* 0.76*   BNP: Invalid input(s): POCBNP CBG: No results for input(s): GLUCAP in the last 168 hours. D-Dimer No results for input(s): DDIMER in the last 72 hours. Hgb A1c No results for input(s): HGBA1C in the last 72 hours. Lipid Profile No results for input(s): CHOL, HDL, LDLCALC, TRIG, CHOLHDL, LDLDIRECT in the last 72 hours. Thyroid function studies No results for input(s): TSH, T4TOTAL, T3FREE, THYROIDAB in the last 72 hours.  Invalid input(s): FREET3 Anemia work up No results for input(s): VITAMINB12, FOLATE, FERRITIN, TIBC, IRON, RETICCTPCT in the last 72 hours. Urinalysis    Component Value Date/Time   COLORURINE YELLOW 05/08/2017 0028   APPEARANCEUR HAZY (A) 05/08/2017 0028   LABSPEC 1.018 05/08/2017 0028   PHURINE 7.0 05/08/2017 0028   GLUCOSEU NEGATIVE 05/08/2017 0028   HGBUR NEGATIVE 05/08/2017  0028   BILIRUBINUR NEGATIVE 05/08/2017 0028   KETONESUR NEGATIVE 05/08/2017 0028   PROTEINUR NEGATIVE 05/08/2017 0028   UROBILINOGEN 0.2 05/08/2014 1232   NITRITE NEGATIVE 05/08/2017 0028   LEUKOCYTESUR NEGATIVE 05/08/2017 0028   Sepsis Labs Invalid input(s): PROCALCITONIN,  WBC,  LACTICIDVEN Microbiology Recent Results (from the past 240 hour(s))  Blood culture (routine x 2)     Status: None (Preliminary result)   Collection Time: 05/07/17  9:55 PM  Result Value Ref Range Status   Specimen Description   Final    BLOOD RIGHT FOREARM Performed at [facility], [place] [address], [place], [place] [address]    Special Requests   Final    BOTTLES DRAWN AEROBIC AND ANAEROBIC Blood  Culture adequate volume Performed at [place] [address] [place], [place] [address]    Culture   Final    NO GROWTH 3 DAYS Performed at [facility], [place] [address] [place], [place] [address]    Report Status PENDING  Incomplete  Blood culture (routine x 2)     Status: None (Preliminary result)   Collection Time: 05/07/17 10:03 PM  Result Value Ref Range Status   Specimen Description   Final    BLOOD RIGHT HAND Performed at [place] [address], [place], [place] [address]    Special Requests   Final    BOTTLES DRAWN AEROBIC AND ANAEROBIC Blood Culture adequate volume Performed at [place] [address] [place], [place] [address]    Culture   Final    NO GROWTH 3 DAYS Performed at [facility], [place] [address], [place], [place] [address]    Report Status PENDING  Incomplete  MRSA PCR Screening     Status: None   Collection Time: 05/08/17  4:26 AM  Result Value Ref Range Status   MRSA by PCR NEGATIVE NEGATIVE Final    Comment:        The GeneXpert MRSA Assay (FDA approved for NASAL specimens only), is one component of a comprehensive MRSA colonization surveillance program. It is  not intended to diagnose MRSA infection nor to guide or monitor treatment for MRSA infections. Performed at [facility], [place] [address] [place], [place] [address]     Time coordinating discharge: 32 minutes  SIGNED:  Chipper Oman, MD  [facility] 05/10/2017, 5:57 PM  Pager please text page via  [email or web address]  Note - This record has been created using [employer]. Chart creation errors have been sought, but may not always have been located. Such creation errors do not reflect on the standard of medical care.

## 2017-05-10 NOTE — Evaluation (Signed)
Physical Therapy Evaluation Patient Details Name: Katie [name] MRN: [id number] DOB: [birth year] Today's Date: 05/10/2017   History of Present Illness  77 yo female admitted with septic shock, +flu. Hx of A fib, CHF, CKD, COPD, CVA, hypothyroidism, CAD, pulmonary fibrosis, aoritic aneurysm.     Clinical Impression  On eval, pt required Min guard-Min assist for mobility. Pt presents with general weakness, decreased activity tolerance, and impaired gait and balance. She was barely able to walk ~25 feet before requiring a seated rest break. HR up to 133 bpm, O2 sat 91% on RA, and dyspnea 3/4 with ambulation. Do not feel pt is safe to d/c home on today. Discussed d/c plan-pt plans to return home with her husband who also has the flu. She declines placement and HHPT f/u at this time. Will continue to follow and progress activity as tolerated. Instructed pt to use her RW for ambulation safety until she returns to baseline.     Follow Up Recommendations Home health PT;Supervision/Assistance - 24 hour(if pt will agree. She is currently declining HHPT f/u at this time)    Equipment Recommendations  None recommended by PT    Recommendations for Other Services       Precautions / Restrictions Precautions Precautions: Fall Precaution Comments: droplet Restrictions Weight Bearing Restrictions: No      Mobility  Bed Mobility               General bed mobility comments: oob in recliner  Transfers Overall transfer level: Needs assistance   Transfers: Sit to/from Stand Sit to Stand: Supervision         General transfer comment: for safety.   Ambulation/Gait Ambulation/Gait assistance: Min assist Ambulation Distance (Feet): 25 Feet(x2) Assistive device: 1 person hand held assist Gait Pattern/deviations: Step-through pattern;Decreased stride length     General Gait Details:  1 HHA assist needed during walk back to room. Pt c/o legs "feeling like rubber...weak. I feel like I  might fall". Slow gait speed. Seated rest break needed due to fatigue, weakness. O2 sat 91% on RA, HR 133 bpm.   Stairs            Wheelchair Mobility    Modified Rankin (Stroke Patients Only)       Balance Overall balance assessment: Needs assistance           Standing balance-Leahy Scale: Fair                               Pertinent Vitals/Pain Pain Assessment: Faces Faces Pain Scale: Hurts little more Pain Location: throat Pain Descriptors / Indicators: Sore Pain Intervention(s): Limited activity within patient's tolerance    Home Living Family/patient expects to be discharged to:: Private residence Living Arrangements: Spouse/significant other Available Help at Discharge: Family Type of Home: House Home Access: Stairs to enter Entrance Stairs-Rails: Left;Can reach Advertising account executive of Steps: 5 Home Layout: One level Home Equipment: Environmental consultant - 2 wheels;Walker - 4 wheels;Cane - single point;Bedside commode;Grab bars - tub/shower      Prior Function Level of Independence: Independent               Hand Dominance        Extremity/Trunk Assessment   Upper Extremity Assessment Upper Extremity Assessment: Overall WFL for tasks assessed    Lower Extremity Assessment Lower Extremity Assessment: Generalized weakness    Cervical / Trunk Assessment Cervical / Trunk Assessment: Normal  Communication  Communication: No difficulties  Cognition Arousal/Alertness: Awake/alert Behavior During Therapy: WFL for tasks assessed/performed Overall Cognitive Status: Within Functional Limits for tasks assessed                                        General Comments      Exercises     Assessment/Plan    PT Assessment Patient needs continued PT services  PT Problem List Decreased strength;Decreased balance;Decreased mobility;Decreased activity tolerance       PT Treatment Interventions DME instruction;Gait  training;Functional mobility training;Therapeutic activities;Balance training;Patient/family education;Therapeutic exercise    PT Goals (Current goals can be found in the Care Plan section)  Acute Rehab PT Goals Patient Stated Goal: to feel better PT Goal Formulation: With patient Time For Goal Achievement: 05/24/17 Potential to Achieve Goals: Good    Frequency Min 3X/week   Barriers to discharge        Co-evaluation               AM-PAC PT "6 Clicks" Daily Activity  Outcome Measure Difficulty turning over in bed (including adjusting bedclothes, sheets and blankets)?: None Difficulty moving from lying on back to sitting on the side of the bed? : None Difficulty sitting down on and standing up from a chair with arms (e.g., wheelchair, bedside commode, etc,.)?: Unable Help needed moving to and from a bed to chair (including a wheelchair)?: A Little Help needed walking in hospital room?: A Little Help needed climbing 3-5 steps with a railing? : A Little 6 Click Score: 18    End of Session Equipment Utilized During Treatment: Gait belt Activity Tolerance: Patient limited by fatigue Patient left: in chair;with call bell/phone within reach   PT Visit Diagnosis: Muscle weakness (generalized) (M62.81);Difficulty in walking, not elsewhere classified (R26.2)    Time: 5859-2924 PT Time Calculation (min) (ACUTE ONLY): 15 min   Charges:   PT Evaluation $PT Eval Moderate Complexity: 1 Mod     PT G Codes:        Weston Anna, MPT Pager: [phone]

## 2017-05-11 ENCOUNTER — Telehealth: Payer: Self-pay | Admitting: *Deleted

## 2017-05-11 NOTE — Telephone Encounter (Signed)
Patient has been discharged from the hospital and is having edema and wheezing.  She takes Lasix 80 mg and asked if she can take an extra 40 mg.  Per Dr Melford Aase, she can add the extra 1/2 tablet, if needed.  The patien scheduled a hospital follow up visit.

## 2017-05-12 ENCOUNTER — Other Ambulatory Visit: Payer: Self-pay | Admitting: Physician Assistant

## 2017-05-12 ENCOUNTER — Other Ambulatory Visit: Payer: Self-pay | Admitting: Internal Medicine

## 2017-05-12 DIAGNOSIS — I509 Heart failure, unspecified: Secondary | ICD-10-CM | POA: Diagnosis not present

## 2017-05-12 DIAGNOSIS — Z7901 Long term (current) use of anticoagulants: Secondary | ICD-10-CM | POA: Diagnosis not present

## 2017-05-12 DIAGNOSIS — N189 Chronic kidney disease, unspecified: Secondary | ICD-10-CM | POA: Diagnosis not present

## 2017-05-12 DIAGNOSIS — I130 Hypertensive heart and chronic kidney disease with heart failure and stage 1 through stage 4 chronic kidney disease, or unspecified chronic kidney disease: Secondary | ICD-10-CM | POA: Diagnosis not present

## 2017-05-12 DIAGNOSIS — J441 Chronic obstructive pulmonary disease with (acute) exacerbation: Secondary | ICD-10-CM | POA: Diagnosis not present

## 2017-05-12 DIAGNOSIS — I4891 Unspecified atrial fibrillation: Secondary | ICD-10-CM | POA: Diagnosis not present

## 2017-05-12 LAB — CULTURE, BLOOD (ROUTINE X 2)
CULTURE: NO GROWTH
Culture: NO GROWTH
SPECIAL REQUESTS: ADEQUATE
SPECIAL REQUESTS: ADEQUATE

## 2017-05-12 MED ORDER — BENZONATATE 200 MG PO CAPS: ORAL_CAPSULE | ORAL | 1 refills | Status: DC

## 2017-05-12 MED ORDER — NYSTATIN-TRIAMCINOLONE 100000-0.1 UNIT/GM-% EX OINT: 1.0000 "application " | TOPICAL_OINTMENT | Freq: Two times a day (BID) | CUTANEOUS | 0 refills | Status: DC

## 2017-05-12 MED ORDER — WARFARIN SODIUM 3 MG PO TABS: ORAL_TABLET | ORAL | 1 refills | Status: DC

## 2017-05-12 NOTE — Progress Notes (Signed)
Future Appointments  Date Time Provider [place]  05/16/2017  2:00 PM Liane Comber, NP [facility] None  05/26/2017  2:30 PM Liane Comber, NP [facility] None  06/08/2017  1:00 PM Pete Pelt, PA-C [facility] None  02/02/2018  9:00 AM [name], MD [facility] None

## 2017-05-13 ENCOUNTER — Other Ambulatory Visit: Payer: Self-pay | Admitting: Internal Medicine

## 2017-05-13 MED ORDER — NAFTIFINE HCL 1 % EX CREA: TOPICAL_CREAM | Freq: Every day | CUTANEOUS | 0 refills | Status: DC

## 2017-05-15 DIAGNOSIS — R1310 Dysphagia, unspecified: Secondary | ICD-10-CM | POA: Insufficient documentation

## 2017-05-15 NOTE — Progress Notes (Signed)
Hospital follow up  Assessment and Plan: Hospital visit follow up for septic shock:  Hospital discharge meds were reviewed, and reconciled with the patient.   Septic shock ([place]) Secondary to influenza; Resolved. Cultures reviewed per discharge request and verified negative for bacterial agents -  -     CBC with Differential/Platelet  CKD  stage III (GFR 51 ml/min) AKI resolved to baseline on discharge; continue to follow closely with avoidance of nephrotoxic agents -     BASIC METABOLIC PANEL WITH GFR  Chronic systolic CHF (congestive heart failure) (HCC) Stable by ECHO; weights stable, no edema Continue lasix/furosemide Check BMP/GFR Continue to monitor daily weights, limit sodium and monitor water intake Call office with > 2lb weight gain overnight Follow up with cardiology   COPD (chronic obstructive pulmonary disease) with chronic bronchitis (HCC) Continue inhalers - has not been taking budesonide, this was refilled today; aggressively treat URIs; CXR annually O2 walk test performed today - does not qualify based on medicare criteria Previously established with Dr. Alvina Filbert, but would like to be referred to alternate provider to discuss ongoing respiratory concerns  Chronic atrial fibrillation ([place]) INR elevated 1 week ago; will recheck and adjust accordingly -     Protime-INR  Esophageal dysphagia -     Ambulatory referral to Gastroenterology  There are no discontinued medications. CAN NOT DO FOR BCBS REGULAR OR MEDICARE  Over 40 minutes of exam, counseling, chart review, and complex, high/moderate level critical decision making was performed this visit.   Future Appointments  Date Time Provider Ruth  05/26/2017  2:30 PM Katie Comber, NP [facility] None  06/08/2017  1:00 PM Katie Pelt, PA-C [facility] None  02/02/2018  9:00 AM Katie Pinto, MD [facility] None    HPI 77 y.o.female with medical history of A. fib, CHF, hypertension, CKD, COPD,  hypothyroidism and history of migraine presents for follow up for transition from recent hospitalization. Admit date to the hospital was 05/07/17, patient was discharged from the hospital on 05/10/17 and our clinical staff contacted the office the day after discharge to set up a follow up appointment. The discharge summary, medications, and diagnostic test results were reviewed before meeting with the patient. The patient was admitted for: septic shock due to influenza A/ acute respiratory failure with hypoxia  On 2/23 she resented to the emergency department with cough,associated with shortness of breath, confusion and lethargy.In the ER he was found to be in A. fib with RVR and hypotensive shock as well febrile with imperative of 102.3. Influenza A positive. Patient was started on IV fluids and empiric antibiotic (vancomycin/zosyn) and she was admitted to the ICU due to septic shock not responding to IV fluid. Subsequently started on levophed,which eventually was weaned off, and patient blood pressure was normalized. Patient also received Cardizem drip for a short period time due to hypotension.  Patient breathing significantly improved, A. fib was controlled and confusion resolved. Discharged with 2 remaining days of treatment with tamiflu, and an extended taper of oral prednisone. She did receive influenza vaccination this season in Oct 2018.   Septic shock due to influenza A / Acute respiratory failure with hypoxia  Completed tamiflu; continues with prednisone taper Cultures reviewed; no bacterial growth Reports ongoing dyspnea with exertion -   Acute metabolic encephalopathy Due to infectious process and hypoxia  Resolved  Chronic A. fib with RVR - stable Due to infectious process Initially treated with Cardizem drip which was transitioned to oral diltiazem. Discharged on cardizem and digoxin.  INR was elevated at 3.3 on 2/26 - Coumadin was held for 1 night and resumed Rate/rhythm stable  today  AKI on CKDstage III -resolved Prerenal, Cr 1.58 on 2/23 treated by IV fluid while hospitalized, discharge Cr back to baseline at 1.15 on 2/26.  Chronic diastolic CHF No signs of fluid overload. 05/08/2017 Echocardiogram with EF 45%, no significant changes from previous She denies paroxysmal nocturnal dyspnea and edema. Positive for dyspnea and fatigue. She reports she continues with daily weights and weights have improved down from 210# to 200#.  Wt Readings from Last 3 Encounters:  05/16/17 199 lb (90.3 kg)  05/10/17 211 lb 11.2 oz (96 kg)  04/26/17 201 lb (91.2 kg)    Patient is on Coumadin for Patient's last INR is  Lab Results  Component Value Date   INR 3.30 05/10/2017   INR 4.20 (HH) 05/09/2017   INR 2.78 05/08/2017    Patient denies SOB, CP, dizziness, nose bleeds, easy bleeding, and blood in stool/urine. Her coumadin dose was not changed last visit. She has taken ABX, has not missed any doses and denies a fall.  She continues to take 3 mg daily.   All other chronic medical condition were stable during the hospitalization.   Patient was seen by physical therapy while hospitalized, recommended home health PT and will be initiating tomorrow. She was also being evaluated by home health who called [place] to communicate some concerns with BPs and oxygen levels that were obtained while he was at her home.   Today she presents with resting O2 on RA 97% - is is pursed lip breathing intermittently, does speak in complete sentences, reports no dyspnea at rest but endorses with ambulation.  Oxygen saturations with walking are 90%, with 2 L O2 improves slighly to 92%.    Images while in the hospital: Dg Chest Portable 1 View  Result Date: 05/07/2017 CLINICAL DATA:  Shortness of breath, lethargy. EXAM: PORTABLE CHEST 1 VIEW COMPARISON:  Chest radiograph Jul 14, 2016 FINDINGS: Cardiac silhouette is moderate to severely enlarged and unchanged. Calcified aortic knob. Interstitial  prominence, decreased from prior examination without pleural effusion or focal consolidation. No pneumothorax. Soft tissue planes and included osseous structures are non suspicious. IMPRESSION: Moderate to severe cardiomegaly. Interstitial prominence most compatible with pulmonary edema. No focal consolidation. Electronically Signed   By: Elon Alas M.D.   On: 05/07/2017 22:19    Past Medical History:  Diagnosis Date  . Atrial fibrillation ([place])   . Migraine headache   . Osteopenia      Allergies  Allergen Reactions  . Amiodarone Other (See Comments)    PULMONARY TOXICITY  . Diovan [Valsartan] Other (See Comments)    HYPOTENSION  . Doxycycline Diarrhea and Other (See Comments)    VISUAL DISTURBANCE  . Flexeril [Cyclobenzaprine] Other (See Comments)    FATIGUE  . Keflex [Cephalexin] Diarrhea  . Verapamil Other (See Comments)    EDEMA  . Codeine Other (See Comments)    unknown      Current Outpatient Medications on File Prior to Visit  Medication Sig Dispense Refill  . acetaminophen (TYLENOL) 500 MG tablet Take 1,000 mg by mouth daily.     Marland Kitchen albuterol (PROVENTIL HFA;VENTOLIN HFA) 108 (90 Base) MCG/ACT inhaler Inhale 2 puffs into the lungs every 4 (four) hours as needed for shortness of breath. 2 puffs 4- 6 hrs to rescue asthma 60 g 0  . allopurinol (ZYLOPRIM) 300 MG tablet Take 300 mg by mouth daily.    Marland Kitchen  aspirin EC 81 MG tablet Take 81 mg by mouth daily.    . benzonatate (TESSALON) 200 MG capsule Take 1 perle 3 x / day to prevent cough 30 capsule 1  . budesonide (PULMICORT) 0.25 MG/2ML nebulizer solution Take 2 mLs (0.25 mg total) by nebulization 2 (two) times daily. 60 mL 12  . Cholecalciferol (VITAMIN D3) 5000 units CAPS Takes 2 caps (10,000 units) daily (Patient taking differently: Take 5,000 Units by mouth daily. )    . digoxin (LANOXIN) 0.125 MG tablet Take 1 tablet daily for Heart Rhythm (Patient taking differently: Take 0.125 mg by mouth daily. Take 1 tablet daily  for Heart Rhythm) 90 tablet 3  . diltiazem (TIAZAC) 120 MG 24 hr capsule TAKE 1 CAPSULE BY MOUTH EVERY DAY (Patient taking differently: TAKE 1/2 CAPSULE BY MOUTH EVERY DAY) 90 capsule 1  . furosemide (LASIX) 80 MG tablet TAKE 1 TABLET BY MOUTH TWICE A DAY AS NEEDED FOR FLUID (Patient taking differently: Take 80 mg by mouth daily. TAKE 1 TABLET BY MOUTH TWICE A DAY AS NEEDED FOR FLUI) 180 tablet 1  . gabapentin (NEURONTIN) 600 MG tablet Takes 1/2 to 1 tablet 3 times daily. (Patient taking differently: Take 600 mg by mouth 3 (three) times daily. ) 270 tablet 1  . guaiFENesin-dextromethorphan (ROBITUSSIN DM) 100-10 MG/5ML syrup Take 10 mLs by mouth every 4 (four) hours as needed for cough. 118 mL 0  . levothyroxine (SYNTHROID, LEVOTHROID) 50 MCG tablet TAKE 1 TABLET BY MOUTH EVERY DAY (Patient taking differently: Take 1.5 tablets on all days) 90 tablet 1  . mirabegron ER (MYRBETRIQ) 25 MG TB24 tablet Take 1 tablet (25 mg total) by mouth daily. (Patient taking differently: Take 25 mg by mouth as needed. ) 30 tablet 0  . montelukast (SINGULAIR) 10 MG tablet TAKE 1 TABLET EVERY DAY FOR ALLERGIES 90 tablet 1  . naftifine (NAFTIN) 1 % cream Apply topically daily. Apply 1 to 2 x / day for yeast infection 90 g 0  . Olopatadine HCl 0.2 % SOLN Place 1 drop into both eyes daily.     . pantoprazole (PROTONIX) 40 MG tablet Take 1 tablet (40 mg total) by mouth daily.    [name] Glycol-Propyl Glycol (SYSTANE OP) Apply 1 drop to eye 4 (four) times daily.     . potassium chloride SA (KLOR-CON M20) 20 MEQ tablet TAKE 2 TABLETS (40 MEQ TOTAL) BY MOUTH DAILY. (Patient taking differently: Take 40 mEq by mouth daily. ) 180 tablet 1  . pravastatin (PRAVACHOL) 40 MG tablet TAKE 1 TABLET BY MOUTH AT BEDTIME FOR CHOLESTEROL 90 tablet 1  . prednisoLONE acetate (PRED FORTE) 1 % ophthalmic suspension INSTILL 1 DROP INTO BOTH EYES FOUR TIMES A DAY FOR 10 DAYS AND THEN TWICE DAY.  1  . predniSONE (DELTASONE) 10 MG tablet Take 4  tablets for 3 days; Take 3 tablets for 4 days; Take 2 tablets for 3 days; Take 1 tablet for 4 days 34 tablet 0  . prochlorperazine (COMPAZINE) 5 MG tablet TAKE 1 TABLET BY MOUTH THREE TIMES A DAY AS NEEDED FOR VERTIGO OR NAUSEA 60 tablet 0  . warfarin (COUMADIN) 3 MG tablet TAKE 1-2 TABLETS DAILY AS DIRECTED 180 tablet 1   No current facility-administered medications on file prior to visit.     ROS: all negative except above.   Physical Exam: Filed Weights   05/16/17 1351  Weight: 199 lb (90.3 kg)   BP 136/72   Pulse 65   Temp 98.1  F (36.7 C)   Ht 5' 3.5" (1.613 m)   Wt 199 lb (90.3 kg)   SpO2 97%   BMI 34.70 kg/m  General Appearance: Well nourished, in no acute distress. Eyes: PERRLA, EOMs, conjunctiva no swelling or erythema Sinuses: No Frontal/maxillary tenderness ENT/Mouth: Ext aud canals clear, TMs without erythema, bulging. No erythema, swelling, or exudate on post pharynx.  Tonsils not swollen or erythematous. Hearing normal.  Neck: Supple, thyroid normal.  Respiratory: Respiratory effort somewhat increase with intermittent pursed lip breathing, BS equal bilaterally without rales, rhonchi, wheezing or stridor.  Cardio: RRR with 3/6 systolic murmur without readiation. Brisk peripheral pulses without edema.  Abdomen: Soft, + BS.  Non tender, no guarding, rebound, hernias, masses. Lymphatics: Non tender without lymphadenopathy.  Musculoskeletal: Full ROM, 5/5 strength, slow gait.  Skin: Warm, dry without rashes, lesions, ecchymosis.  Neuro: Cranial nerves intact. Normal muscle tone, no cerebellar symptoms. Sensation intact.  Psych: Awake and oriented X 3, normal affect, Insight and Judgment appropriate.     Katie Ribas, NP 2:05 PM [facility]

## 2017-05-16 ENCOUNTER — Encounter: Payer: Self-pay | Admitting: Adult Health

## 2017-05-16 ENCOUNTER — Ambulatory Visit (INDEPENDENT_AMBULATORY_CARE_PROVIDER_SITE_OTHER): Payer: Medicare Other | Admitting: Adult Health

## 2017-05-16 VITALS — BP 136/72 | HR 65 | Temp 98.1°F | Ht 63.5 in | Wt 199.0 lb

## 2017-05-16 DIAGNOSIS — N183 Chronic kidney disease, stage 3 unspecified: Secondary | ICD-10-CM

## 2017-05-16 DIAGNOSIS — A419 Sepsis, unspecified organism: Secondary | ICD-10-CM | POA: Diagnosis not present

## 2017-05-16 DIAGNOSIS — I482 Chronic atrial fibrillation, unspecified: Secondary | ICD-10-CM

## 2017-05-16 DIAGNOSIS — R1319 Other dysphagia: Secondary | ICD-10-CM

## 2017-05-16 DIAGNOSIS — I5022 Chronic systolic (congestive) heart failure: Secondary | ICD-10-CM

## 2017-05-16 DIAGNOSIS — R6521 Severe sepsis with septic shock: Secondary | ICD-10-CM | POA: Diagnosis not present

## 2017-05-16 DIAGNOSIS — R1310 Dysphagia, unspecified: Secondary | ICD-10-CM | POA: Diagnosis not present

## 2017-05-16 DIAGNOSIS — J449 Chronic obstructive pulmonary disease, unspecified: Secondary | ICD-10-CM

## 2017-05-16 DIAGNOSIS — J4489 Other specified chronic obstructive pulmonary disease: Secondary | ICD-10-CM

## 2017-05-16 MED ORDER — BUDESONIDE 0.25 MG/2ML IN SUSP: 0.2500 mg | Freq: Two times a day (BID) | RESPIRATORY_TRACT | 12 refills | Status: DC

## 2017-05-17 ENCOUNTER — Emergency Department (HOSPITAL_COMMUNITY)
Admission: EM | Admit: 2017-05-17 | Discharge: 2017-05-17 | Disposition: A | Discharge status: Home / Self Care | Payer: Medicare Other | Attending: Physician Assistant | Admitting: Physician Assistant

## 2017-05-17 ENCOUNTER — Other Ambulatory Visit: Payer: Self-pay

## 2017-05-17 ENCOUNTER — Encounter (HOSPITAL_COMMUNITY): Payer: Self-pay | Admitting: Emergency Medicine

## 2017-05-17 ENCOUNTER — Other Ambulatory Visit: Payer: Self-pay | Admitting: Internal Medicine

## 2017-05-17 DIAGNOSIS — E039 Hypothyroidism, unspecified: Secondary | ICD-10-CM | POA: Diagnosis not present

## 2017-05-17 DIAGNOSIS — Z7902 Long term (current) use of antithrombotics/antiplatelets: Secondary | ICD-10-CM | POA: Diagnosis not present

## 2017-05-17 DIAGNOSIS — N183 Chronic kidney disease, stage 3 (moderate): Secondary | ICD-10-CM | POA: Diagnosis not present

## 2017-05-17 DIAGNOSIS — R791 Abnormal coagulation profile: Secondary | ICD-10-CM | POA: Diagnosis not present

## 2017-05-17 DIAGNOSIS — Z7901 Long term (current) use of anticoagulants: Secondary | ICD-10-CM | POA: Diagnosis not present

## 2017-05-17 DIAGNOSIS — J449 Chronic obstructive pulmonary disease, unspecified: Secondary | ICD-10-CM | POA: Insufficient documentation

## 2017-05-17 DIAGNOSIS — I130 Hypertensive heart and chronic kidney disease with heart failure and stage 1 through stage 4 chronic kidney disease, or unspecified chronic kidney disease: Secondary | ICD-10-CM | POA: Insufficient documentation

## 2017-05-17 DIAGNOSIS — Z7982 Long term (current) use of aspirin: Secondary | ICD-10-CM | POA: Diagnosis not present

## 2017-05-17 DIAGNOSIS — R799 Abnormal finding of blood chemistry, unspecified: Secondary | ICD-10-CM | POA: Diagnosis present

## 2017-05-17 DIAGNOSIS — Z87891 Personal history of nicotine dependence: Secondary | ICD-10-CM | POA: Insufficient documentation

## 2017-05-17 DIAGNOSIS — I5022 Chronic systolic (congestive) heart failure: Secondary | ICD-10-CM | POA: Insufficient documentation

## 2017-05-17 DIAGNOSIS — Z79899 Other long term (current) drug therapy: Secondary | ICD-10-CM | POA: Insufficient documentation

## 2017-05-17 LAB — BASIC METABOLIC PANEL WITH GFR
BUN / CREAT RATIO: 20 (calc) (ref 6–22)
BUN: 28 mg/dL — AB (ref 7–25)
CO2: 29 mmol/L (ref 20–32)
CREATININE: 1.39 mg/dL — AB (ref 0.60–0.93)
Calcium: 10.3 mg/dL (ref 8.6–10.4)
Chloride: 97 mmol/L — ABNORMAL LOW (ref 98–110)
GFR, EST AFRICAN AMERICAN: 43 mL/min/{1.73_m2} — AB (ref >=60)
GFR, Est Non African American: 37 mL/min/{1.73_m2} — ABNORMAL LOW (ref >=60)
GLUCOSE: 82 mg/dL (ref 65–99)
Potassium: 4.8 mmol/L (ref 3.5–5.3)
SODIUM: 138 mmol/L (ref 135–146)

## 2017-05-17 LAB — CBC
HCT: 49.9 % — ABNORMAL HIGH (ref 36.0–46.0)
HEMOGLOBIN: 16.3 g/dL — AB (ref 12.0–15.0)
MCH: 30.5 pg (ref 26.0–34.0)
MCHC: 32.7 g/dL (ref 30.0–36.0)
MCV: 93.3 fL (ref 78.0–100.0)
Platelets: 249 10*3/uL (ref 150–400)
RBC: 5.35 MIL/uL — AB (ref 3.87–5.11)
RDW: 16.2 % — ABNORMAL HIGH (ref 11.5–15.5)
WBC: 11.2 10*3/uL — ABNORMAL HIGH (ref 4.0–10.5)

## 2017-05-17 LAB — CBC WITH DIFFERENTIAL/PLATELET
BASOS ABS: 23 {cells}/uL (ref 0–200)
Basophils Relative: 0.2 %
Eosinophils Absolute: 23 cells/uL (ref 15–500)
Eosinophils Relative: 0.2 %
HEMATOCRIT: 49.5 % — AB (ref 35.0–45.0)
Hemoglobin: 17.2 g/dL — ABNORMAL HIGH (ref 11.7–15.5)
LYMPHS ABS: 1357 {cells}/uL (ref 850–3900)
MCH: 30 pg (ref 27.0–33.0)
MCHC: 34.7 g/dL (ref 32.0–36.0)
MCV: 86.4 fL (ref 80.0–100.0)
MPV: 10.4 fL (ref 7.5–12.5)
Monocytes Relative: 5.6 %
NEUTROS PCT: 82.1 %
Neutro Abs: 9359 cells/uL — ABNORMAL HIGH (ref 1500–7800)
Platelets: 278 10*3/uL (ref 140–400)
RBC: 5.73 10*6/uL — AB (ref 3.80–5.10)
RDW: 15.7 % — AB (ref 11.0–15.0)
Total Lymphocyte: 11.9 %
WBC: 11.4 10*3/uL — ABNORMAL HIGH (ref 3.8–10.8)
WBCMIX: 638 {cells}/uL (ref 200–950)

## 2017-05-17 LAB — I-STAT CG4 LACTIC ACID, ED: Lactic Acid, Venous: 2.85 mmol/L (ref 0.5–1.9)

## 2017-05-17 LAB — PROTIME-INR
INR: 11.9
INR: 7.79
Prothrombin Time: 123 s — ABNORMAL HIGH (ref 9.0–11.5)
Prothrombin Time: 65.1 seconds — ABNORMAL HIGH (ref 11.4–15.2)

## 2017-05-17 MED ORDER — PHYTONADIONE 5 MG PO TABS
2.5000 mg | ORAL_TABLET | Freq: Once | ORAL | Status: AC
  Administered 2017-05-17: 2.5 mg via ORAL
  Filled 2017-05-17: qty 1

## 2017-05-17 NOTE — ED Provider Notes (Signed)
[facility] EMERGENCY DEPARTMENT Provider Note   CSN: [id number] Arrival date & time: 05/17/17  1062     History   Chief Complaint Chief Complaint  Patient presents with  . Abnormal Lab    HPI Katie [name] is a 77 y.o. female.  HPI   Patient is a 77 year old female with complicated past medical history, highlights for today's visits include A. fib on warfarin.  Patient reports that her PCP told her came here because of elevated INR.  It was 11.5 yesterday.  She took her warfarin as usual this morning.  INR today is 7.7.  Patient had no bleeding, no black tarry stools, no complaints.  Feels her normal state of health  Past Medical History:  Diagnosis Date  . Atrial fibrillation ([place])   . Migraine headache   . Osteopenia     Patient Active Problem List   Diagnosis Date Noted  . Esophageal dysphagia 05/15/2017  . Gout 03/20/2017  . Chronic systolic CHF (congestive heart failure) ([place]) 07/19/2016  . Chest pain 07/07/2016  . Hyponatremia 07/07/2016  . Other abnormal glucose 09/19/2015  . AAA (abdominal aortic aneurysm) without rupture ([place]) 04/22/2015  . Morbid obesity due to excess calories ([place]) 08/03/2014  . PVD (peripheral vascular disease) with claudication ([place]) 10/16/2013  . CKD  stage III (GFR 51 ml/min) 06/08/2013  . Hyperlipidemia 06/08/2013  . Medication management 06/08/2013  . 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 Long term current use of anticoagulant therapy 04/23/2013  . Vitamin D deficiency 02/15/2013  . Hypothyroidism   . Osteopenia   . Congestive heart failure ([place]) 11/25/2008  . Migraine headache 11/22/2008  . Essential hypertension 11/22/2008  . Coronary atherosclerosis 11/22/2008  . Atrial fibrillation ([place]) 11/22/2008  . COPD (chronic obstructive pulmonary disease) with chronic bronchitis ([place]) 11/22/2008  . GERD 11/22/2008    Past Surgical History:  Procedure Laterality Date  . ABDOMINAL AORTIC  ENDOVASCULAR STENT GRAFT N/A 04/11/2013   Procedure: ABDOMINAL AORTIC ENDOVASCULAR STENT GRAFT WITH RIGHT FEMORAL PATCH ANGIOPLASTY;  Surgeon: Mal Misty, MD;  Location: [facility];  Service: Vascular;  Laterality: N/A;  . BREAST SURGERY     LEFT BREAST BIOPSY  . broken leg Left 1970s  . CARDIAC CATHETERIZATION    . CHOLECYSTECTOMY  1972  . COLONOSCOPY    . EYE SURGERY Bilateral   . Laser vein procedure    . REFRACTIVE SURGERY    . TONSILLECTOMY      OB History    Gravida Para Term Preterm AB Living   4 3 3   1 3    SAB TAB Ectopic Multiple Live Births                   Home Medications    Prior to Admission medications   Medication Sig Start Date End Date Taking? Authorizing Provider  acetaminophen (TYLENOL) 500 MG tablet Take 1,000 mg by mouth daily.     [provider]  albuterol (PROVENTIL HFA;VENTOLIN HFA) 108 (90 Base) MCG/ACT inhaler Inhale 2 puffs into the lungs every 4 (four) hours as needed for shortness of breath. 2 puffs 4- 6 hrs to rescue asthma 05/10/17   Patrecia Pour, Christean Grief, MD  allopurinol (ZYLOPRIM) 300 MG tablet Take 300 mg by mouth daily.    [provider]  aspirin EC 81 MG tablet Take 81 mg by mouth daily.    [provider]  benzonatate (TESSALON) 200 MG capsule Take 1 perle 3 x /  day to prevent cough 05/12/17   [name], MD  budesonide (PULMICORT) 0.25 MG/2ML nebulizer solution Take 2 mLs (0.25 mg total) by nebulization 2 (two) times daily. 05/16/17   Liane Comber, NP  Cholecalciferol (VITAMIN D3) 5000 units CAPS Takes 2 caps (10,000 units) daily Patient taking differently: Take 5,000 Units by mouth daily.  01/09/17   [name], MD  digoxin (LANOXIN) 0.125 MG tablet Take 1 tablet daily for Heart Rhythm Patient taking differently: Take 0.125 mg by mouth daily. Take 1 tablet daily for Heart Rhythm 04/26/17 04/26/18  [name], MD  diltiazem [facility]) 120 MG 24 hr capsule TAKE 1 CAPSULE BY MOUTH EVERY DAY Patient  taking differently: TAKE 1/2 CAPSULE BY MOUTH EVERY DAY 12/06/16   Vicie Mutters, PA-C  furosemide (LASIX) 80 MG tablet TAKE 1 TABLET BY MOUTH TWICE A DAY AS NEEDED FOR FLUID Patient taking differently: Take 80 mg by mouth daily. TAKE 1 TABLET BY MOUTH TWICE A DAY AS NEEDED FOR FLUI 01/19/17   [name], MD  gabapentin (NEURONTIN) 600 MG tablet Takes 1/2 to 1 tablet 3 times daily. Patient taking differently: Take 600 mg by mouth 3 (three) times daily.  11/04/16   [name], MD  guaiFENesin-dextromethorphan (ROBITUSSIN DM) 100-10 MG/5ML syrup Take 10 mLs by mouth every 4 (four) hours as needed for cough. 05/10/17   Doreatha Lew, MD  levothyroxine (SYNTHROID, LEVOTHROID) 50 MCG tablet TAKE 1 TABLET BY MOUTH EVERY DAY Patient taking differently: Take 1.5 tablets on all days 01/30/17   [name], MD  mirabegron ER (MYRBETRIQ) 25 MG TB24 tablet Take 1 tablet (25 mg total) by mouth daily. Patient taking differently: Take 25 mg by mouth as needed.  05/11/17   Patrecia Pour, Christean Grief, MD  montelukast (SINGULAIR) 10 MG tablet TAKE 1 TABLET EVERY DAY FOR ALLERGIES 12/28/16   [name], MD  naftifine (NAFTIN) 1 % cream Apply topically daily. Apply 1 to 2 x / day for yeast infection 05/13/17   [name], MD  Olopatadine HCl 0.2 % SOLN Place 1 drop into both eyes daily.     [provider]  pantoprazole (PROTONIX) 40 MG tablet Take 1 tablet (40 mg total) by mouth daily. 07/16/16   Ghimire, Henreitta Leber, MD  Polyethyl Glycol-Propyl Glycol (SYSTANE OP) Apply 1 drop to eye 4 (four) times daily.     [provider]  potassium chloride SA (KLOR-CON M20) 20 MEQ tablet TAKE 2 TABLETS (40 MEQ TOTAL) BY MOUTH DAILY. Patient taking differently: Take 40 mEq by mouth daily.  04/11/17   Vicie Mutters, PA-C  pravastatin (PRAVACHOL) 40 MG tablet TAKE 1 TABLET BY MOUTH AT BEDTIME FOR CHOLESTEROL 06/30/16   [name], MD  prednisoLONE acetate (PRED FORTE) 1 % ophthalmic  suspension INSTILL 1 DROP INTO BOTH EYES FOUR TIMES A DAY FOR 10 DAYS AND THEN TWICE DAY. 04/27/17   [provider]  predniSONE (DELTASONE) 10 MG tablet Take 4 tablets for 3 days; Take 3 tablets for 4 days; Take 2 tablets for 3 days; Take 1 tablet for 4 days 05/10/17   Patrecia Pour, Christean Grief, MD  prochlorperazine (COMPAZINE) 5 MG tablet TAKE 1 TABLET BY MOUTH THREE TIMES A DAY AS NEEDED FOR VERTIGO OR NAUSEA 02/21/17   [name], MD  warfarin (COUMADIN) 2 MG tablet TAKE 1 TO 2 TABLETS BY MOUTH DAILY OR AS DIRECTED 05/17/17   [name], MD  warfarin (COUMADIN) 3 MG tablet TAKE 1-2 TABLETS DAILY AS DIRECTED 05/12/17   Vicie Mutters,  PA-C    Family History Family History  Problem Relation Age of Onset  . Cirrhosis Mother   . Cancer Mother 41       PANCREAS  . Heart defect Sister   . Breast cancer Sister        age 59  . Heart disease Sister   . Stroke Sister   . Alcohol abuse Father   . Depression Father   . Hypertension Brother   . Hyperlipidemia Son   . Heart disease Daughter     Social History Social History   Tobacco Use  . Smoking status: Former Smoker    Types: Cigarettes    Last attempt to quit: 08/10/2002    Years since quitting: 14.7  . Smokeless tobacco: Never Used  . Tobacco comment: History of tobacco abuse  Substance Use Topics  . Alcohol use: Yes    Alcohol/week: 0.0 oz    Comment: rare  . Drug use: No     Allergies   Amiodarone; Diovan [valsartan]; Doxycycline; Flexeril [cyclobenzaprine]; Keflex [cephalexin]; Verapamil; and Codeine   Review of Systems Review of Systems  Constitutional: Negative for activity change.  Respiratory: Negative for shortness of breath.   Cardiovascular: Negative for chest pain.  Gastrointestinal: Negative for abdominal pain.  Neurological: Negative for weakness.     Physical Exam Updated Vital Signs BP 121/71 (BP Location: Left Arm)   Pulse 82   Temp (!) 97.4 F (36.3 C) (Oral)   Resp 20   Ht 5\' 3"   (1.6 m)   Wt 90.3 kg (199 lb)   SpO2 95%   BMI 35.25 kg/m   Physical Exam  Constitutional: She is oriented to person, place, and time. She appears well-developed and well-nourished.  HENT:  Head: Normocephalic and atraumatic.  Eyes: Right eye exhibits no discharge. Left eye exhibits no discharge.  Cardiovascular: Normal heart sounds.  No murmur heard. irregular  Pulmonary/Chest: Effort normal and breath sounds normal. She has no wheezes. She has no rales.  Abdominal: Soft. She exhibits no distension. There is no tenderness.  Neurological: She is oriented to person, place, and time.  Skin: Skin is warm and dry. She is not diaphoretic.  Psychiatric: She has a normal mood and affect.  Nursing note and vitals reviewed.    ED Treatments / Results  Labs (all labs ordered are listed, but only abnormal results are displayed) Labs Reviewed  CBC - Abnormal; Notable for the following components:      Result Value   WBC 11.2 (*)    RBC 5.35 (*)    Hemoglobin 16.3 (*)    HCT 49.9 (*)    RDW 16.2 (*)    All other components within normal limits  PROTIME-INR - Abnormal; Notable for the following components:   Prothrombin Time 65.1 (*)    INR 7.79 (*)    All other components within normal limits  I-STAT CG4 LACTIC ACID, ED - Abnormal; Notable for the following components:   Lactic Acid, Venous 2.85 (*)    All other components within normal limits    EKG  EKG Interpretation None       Radiology No results found.  Procedures Procedures (including critical care time)  Medications Ordered in ED Medications  phytonadione (VITAMIN K) tablet 2.5 mg (not administered)     Initial Impression / Assessment and Plan / ED Course  I have reviewed the triage vital signs and the nursing notes.  Pertinent labs & imaging results that were available during my  care of the patient were reviewed by me and considered in my medical decision making (see chart for details).     Patient is  a 77 year old female with complicated past medical history, highlights for today's visits include A. fib on warfarin.  Patient reports that her PCP told her came here because of elevated INR.  It was 11.5 yesterday.  She took her warfarin as usual this morning.  INR today is 7.7.  Patient had no bleeding, no black tarry stools, no complaints.  Feels her normal state of health   12:42 PM According to up-to-date guidelines.  We will give oral vitamin K, hold patient's dosing for 2 days, and then have it rechecked on Friday.  Patient in agreement with plan.  Final Clinical Impressions(s) / ED Diagnoses   Final diagnoses:  None    ED Discharge Orders    None       Macarthur Critchley, MD 05/17/17 1242

## 2017-05-17 NOTE — Discharge Instructions (Signed)
Do not take your warfarin until Friday when he follow-up with your primary care physician and have your INR rechecked.

## 2017-05-17 NOTE — ED Triage Notes (Signed)
Pt states her doctors office called her this morning to let her know her INR was 11.4. Denies bleeding. Pt awake, alert, appropriate, NAD. VSS

## 2017-05-17 NOTE — ED Notes (Addendum)
Critical INR of 7.79 received from lab. Notified Dr. Billy Fischer who verbally ackknowledged these results.

## 2017-05-17 NOTE — ED Notes (Signed)
Message sent to pharmacy r/t missing medication

## 2017-05-17 NOTE — ED Notes (Signed)
Patient ambulatory to bathroom with steady gait at this time 

## 2017-05-17 NOTE — ED Notes (Signed)
Patient verbalizes understanding of discharge instructions. Opportunity for questioning and answers were provided. Armband removed by staff, pt discharged from ED via wheelchair.  

## 2017-05-17 NOTE — ED Notes (Signed)
Lactic acid drawn by phlebotomy in error. Not ordered by this RN.

## 2017-05-20 ENCOUNTER — Ambulatory Visit: Payer: Self-pay

## 2017-05-20 DIAGNOSIS — I509 Heart failure, unspecified: Secondary | ICD-10-CM | POA: Diagnosis not present

## 2017-05-20 DIAGNOSIS — N189 Chronic kidney disease, unspecified: Secondary | ICD-10-CM | POA: Diagnosis not present

## 2017-05-20 DIAGNOSIS — J441 Chronic obstructive pulmonary disease with (acute) exacerbation: Secondary | ICD-10-CM | POA: Diagnosis not present

## 2017-05-20 DIAGNOSIS — I4891 Unspecified atrial fibrillation: Secondary | ICD-10-CM | POA: Diagnosis not present

## 2017-05-20 DIAGNOSIS — Z7901 Long term (current) use of anticoagulants: Secondary | ICD-10-CM | POA: Diagnosis not present

## 2017-05-20 DIAGNOSIS — I130 Hypertensive heart and chronic kidney disease with heart failure and stage 1 through stage 4 chronic kidney disease, or unspecified chronic kidney disease: Secondary | ICD-10-CM | POA: Diagnosis not present

## 2017-05-22 ENCOUNTER — Other Ambulatory Visit: Payer: Self-pay | Admitting: Internal Medicine

## 2017-05-23 ENCOUNTER — Ambulatory Visit (INDEPENDENT_AMBULATORY_CARE_PROVIDER_SITE_OTHER): Payer: Medicare Other

## 2017-05-23 DIAGNOSIS — Z7901 Long term (current) use of anticoagulants: Secondary | ICD-10-CM | POA: Diagnosis not present

## 2017-05-23 NOTE — Progress Notes (Signed)
Patient presents to the office for a nurse visit, lab only to have her PT/INR checked. Patient states that she in not taking Coumadin at all and not currently taking any antibiotics.

## 2017-05-24 ENCOUNTER — Other Ambulatory Visit: Payer: Self-pay | Admitting: Adult Health

## 2017-05-24 DIAGNOSIS — I4891 Unspecified atrial fibrillation: Secondary | ICD-10-CM | POA: Diagnosis not present

## 2017-05-24 DIAGNOSIS — I509 Heart failure, unspecified: Secondary | ICD-10-CM | POA: Diagnosis not present

## 2017-05-24 DIAGNOSIS — I482 Chronic atrial fibrillation, unspecified: Secondary | ICD-10-CM

## 2017-05-24 DIAGNOSIS — Z7901 Long term (current) use of anticoagulants: Secondary | ICD-10-CM | POA: Diagnosis not present

## 2017-05-24 DIAGNOSIS — I130 Hypertensive heart and chronic kidney disease with heart failure and stage 1 through stage 4 chronic kidney disease, or unspecified chronic kidney disease: Secondary | ICD-10-CM | POA: Diagnosis not present

## 2017-05-24 DIAGNOSIS — J441 Chronic obstructive pulmonary disease with (acute) exacerbation: Secondary | ICD-10-CM | POA: Diagnosis not present

## 2017-05-24 DIAGNOSIS — N189 Chronic kidney disease, unspecified: Secondary | ICD-10-CM | POA: Diagnosis not present

## 2017-05-24 LAB — PROTIME-INR
INR: 1.3 — ABNORMAL HIGH
Prothrombin Time: 13.6 s — ABNORMAL HIGH (ref 9.0–11.5)

## 2017-05-25 ENCOUNTER — Encounter: Payer: Self-pay | Admitting: Gastroenterology

## 2017-05-25 DIAGNOSIS — H04123 Dry eye syndrome of bilateral lacrimal glands: Secondary | ICD-10-CM | POA: Diagnosis not present

## 2017-05-25 DIAGNOSIS — H01022 Squamous blepharitis right lower eyelid: Secondary | ICD-10-CM | POA: Diagnosis not present

## 2017-05-25 DIAGNOSIS — H01021 Squamous blepharitis right upper eyelid: Secondary | ICD-10-CM | POA: Diagnosis not present

## 2017-05-25 DIAGNOSIS — H01025 Squamous blepharitis left lower eyelid: Secondary | ICD-10-CM | POA: Diagnosis not present

## 2017-05-25 DIAGNOSIS — H01024 Squamous blepharitis left upper eyelid: Secondary | ICD-10-CM | POA: Diagnosis not present

## 2017-05-26 ENCOUNTER — Ambulatory Visit: Payer: Self-pay | Admitting: Adult Health

## 2017-05-26 DIAGNOSIS — J441 Chronic obstructive pulmonary disease with (acute) exacerbation: Secondary | ICD-10-CM | POA: Diagnosis not present

## 2017-05-26 DIAGNOSIS — I130 Hypertensive heart and chronic kidney disease with heart failure and stage 1 through stage 4 chronic kidney disease, or unspecified chronic kidney disease: Secondary | ICD-10-CM | POA: Diagnosis not present

## 2017-05-26 DIAGNOSIS — Z7901 Long term (current) use of anticoagulants: Secondary | ICD-10-CM | POA: Diagnosis not present

## 2017-05-26 DIAGNOSIS — N189 Chronic kidney disease, unspecified: Secondary | ICD-10-CM | POA: Diagnosis not present

## 2017-05-26 DIAGNOSIS — I4891 Unspecified atrial fibrillation: Secondary | ICD-10-CM | POA: Diagnosis not present

## 2017-05-26 DIAGNOSIS — I509 Heart failure, unspecified: Secondary | ICD-10-CM | POA: Diagnosis not present

## 2017-05-27 ENCOUNTER — Other Ambulatory Visit: Payer: Self-pay | Admitting: Adult Health

## 2017-05-31 DIAGNOSIS — I130 Hypertensive heart and chronic kidney disease with heart failure and stage 1 through stage 4 chronic kidney disease, or unspecified chronic kidney disease: Secondary | ICD-10-CM | POA: Diagnosis not present

## 2017-05-31 DIAGNOSIS — J441 Chronic obstructive pulmonary disease with (acute) exacerbation: Secondary | ICD-10-CM | POA: Diagnosis not present

## 2017-05-31 DIAGNOSIS — Z7901 Long term (current) use of anticoagulants: Secondary | ICD-10-CM | POA: Diagnosis not present

## 2017-05-31 DIAGNOSIS — I4891 Unspecified atrial fibrillation: Secondary | ICD-10-CM | POA: Diagnosis not present

## 2017-05-31 DIAGNOSIS — I509 Heart failure, unspecified: Secondary | ICD-10-CM | POA: Diagnosis not present

## 2017-05-31 DIAGNOSIS — N189 Chronic kidney disease, unspecified: Secondary | ICD-10-CM | POA: Diagnosis not present

## 2017-05-31 NOTE — Progress Notes (Signed)
Assessment and Plan:  Symptomatic hypotension Presents with weakness, nausea with BP 108/46 - thready pulses Recent watery diarrhea x 3 days Likely quite dehydrated - discussed with patient, she will have daughter drive her to ED for stat labs and IV fluids +hx AAA, CHF, recent sepsis/ICU admission Continue to hold lasix - defer to ED/cardiology when/dose to restart  Chronic atrial fibrillation (HCC) Check INR and will adjust medication according to labs.  Discussed if patient falls to immediately contact office or go to ER. Discussed foods that can increase or decrease Coumadin levels. Patient understands to call the office before starting a new medication. Follow up in one month or sooner if indicated -     Protime-INR  COPD (chronic obstructive pulmonary disease) with chronic bronchitis (HCC) Stable on inhalers Continue close follow up Continue follow up with Dr. 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 Discussed med's effects and SE's.   Over 30 minutes of exam, counseling, chart review, and critical decision making was performed.   Future Appointments  Date Time Provider [place]  06/08/2017  1:00 PM Pete Pelt, PA-C [facility] None  07/12/2017  1:30 PM Mauri Pole, MD [facility]  02/02/2018  9:00 AM [name], MD [facility] None    ------------------------------------------------------------------------------------------------------------------   HPI BP (!) 108/46   Pulse 67   Temp 97.7 F (36.5 C)   Ht 5' 3.5" (1.613 m)   Wt 202 lb (91.6 kg)    SpO2 97%   BMI 35.22 kg/m   77 y.o.female presents for coumadin management for chronic a. Fib, htn, chronic systolic CHF, COPD, hypothyroidism, CKD 3 and morbid obesity. She presents today reporting watery diarrhea 5-10 episodes daily for 3 days over the weekend; she presents today reporting weakness, nausea (has taken promethazine which is somewhat helpful), dyspnea on exertion at 20 ft - BP today is 108/46 - rechecked multiple times, pulses thready and weak. She also has hx AAA as well. She is prescribed digoxin 0.125 mg, diltiazem 120 mg ER tab, and takes lasix 80 mg daily for CHF. She reports she has not taken lasix today; weights stable at home and has not had edema. She has gained 3# by our scales since last visit    Patient is on Coumadin for Chronic atrial fibrillation ([place]) [I48.2] Patient's last INR is  Lab Results  Component Value Date   INR 1.3 (H) 05/23/2017   INR 7.79 (HH) 05/17/2017   INR 11.9 (HH) 05/16/2017    Patient denies SOB, CP, dizziness, nose bleeds, easy bleeding, and blood in stool/urine. Her coumadin dose was changed last visit. She has not taken ABX, has not missed any doses and denies a fall.  Taking 3 mg 1/2 tab daily.   She has a history of Systolic CHF, denies dyspnea on exertion, orthopnea, paroxysmal nocturnal dyspnea and edema. Positive for fatigue and tachypnea. Wt Readings from Last 3 Encounters:  06/01/17 202 lb (91.6 kg)  05/17/17 199 lb (90.3 kg)  05/16/17 199 lb (90.3 kg)   She is on thyroid medication. Her medication was not changed last visit.  She reports she has been taking medication at night 3.5 hours after dinner - forgot to take last night.    Lab Results  Component Value Date   TSH 6.69 (H) 04/22/2017  .    Past Medical History:  Diagnosis Date  . Atrial fibrillation ([place])   . Migraine headache   . Osteopenia      Allergies  Allergen Reactions  . Amiodarone Other (See Comments)    PULMONARY TOXICITY  . Diovan [Valsartan]  Other (See Comments)    HYPOTENSION  . Doxycycline Diarrhea and Other (See Comments)    VISUAL DISTURBANCE  . Flexeril [Cyclobenzaprine] Other (See Comments)    FATIGUE  . Keflex [Cephalexin] Diarrhea  . Verapamil Other (See Comments)    EDEMA  . Codeine Other (See Comments)    unknown    Current Outpatient Medications on File Prior to Visit  Medication Sig  . acetaminophen (TYLENOL) 500 MG tablet Take 1,000 mg by mouth daily.   Marland Kitchen albuterol (PROVENTIL HFA;VENTOLIN HFA) 108 (90 Base) MCG/ACT inhaler Inhale 2 puffs into the lungs every 4 (four) hours as needed for shortness of breath. 2 puffs 4- 6 hrs to rescue asthma  . allopurinol (ZYLOPRIM) 300 MG tablet Take 300 mg by mouth daily.  Marland Kitchen aspirin EC 81 MG tablet Take 81 mg by mouth daily.  . benzonatate (TESSALON) 200 MG capsule Take 1 perle 3 x / day to prevent cough (Patient taking differently: as needed. Take 1 perle 3 x / day to prevent cough)  . budesonide (PULMICORT) 0.25 MG/2ML nebulizer solution Take 2 mLs (0.25 mg total) by nebulization 2 (two) times daily.  . Cholecalciferol (VITAMIN D3) 5000 units CAPS Takes 2 caps (10,000 units) daily (Patient taking differently: Take 5,000 Units by mouth daily. )  . digoxin (LANOXIN) 0.125 MG tablet Take 1 tablet daily for Heart Rhythm (Patient taking differently: Take 0.125 mg by mouth daily. Take 1 tablet daily for Heart Rhythm)  . diltiazem (TIAZAC) 120 MG 24 hr capsule TAKE 1 CAPSULE BY MOUTH EVERY DAY (Patient taking differently: TAKE 1/2 CAPSULE BY MOUTH EVERY DAY)  . furosemide (LASIX) 80 MG tablet TAKE 1 TABLET BY MOUTH TWICE A DAY AS NEEDED FOR FLUID (Patient taking differently: Take 80 mg by mouth daily. TAKE 1 TABLET BY MOUTH TWICE A DAY AS NEEDED FOR FLUI)  . gabapentin (NEURONTIN) 600 MG tablet Takes 1/2 to 1 tablet 3 times daily. (Patient taking differently: Take 600 mg by mouth 3 (three) times daily. )  . guaiFENesin-dextromethorphan (ROBITUSSIN DM) 100-10 MG/5ML syrup Take 10 mLs  by mouth every 4 (four) hours as needed for cough.  . levothyroxine (SYNTHROID, LEVOTHROID) 50 MCG tablet TAKE 1 TABLET BY MOUTH EVERY DAY (Patient taking differently: Take 1.5 tablets on all days)  . mirabegron ER (MYRBETRIQ) 25 MG TB24 tablet Take 1 tablet (25 mg total) by mouth daily. (Patient taking differently: Take 25 mg by mouth as needed. )  . montelukast (SINGULAIR) 10 MG tablet TAKE 1 TABLET EVERY DAY FOR ALLERGIES  . Olopatadine HCl 0.2 % SOLN Place 1 drop into both eyes daily.   . pantoprazole (PROTONIX) 40 MG tablet Take 1 tablet (40 mg total) by mouth daily.  [name] Glycol-Propyl Glycol (SYSTANE OP) Apply 1 drop to eye 4 (four) times daily.   . potassium chloride SA (KLOR-CON M20) 20 MEQ tablet TAKE 2 TABLETS (40 MEQ TOTAL) BY MOUTH DAILY. (Patient taking differently: Take 40 mEq by mouth daily. )  . pravastatin (PRAVACHOL) 40  MG tablet TAKE 1 TABLET BY MOUTH AT BEDTIME FOR CHOLESTEROL  . prednisoLONE acetate (PRED FORTE) 1 % ophthalmic suspension INSTILL 1 DROP INTO BOTH EYES FOUR TIMES A DAY FOR 10 DAYS AND THEN TWICE DAY.  Marland Kitchen prochlorperazine (COMPAZINE) 5 MG tablet TAKE 1 TABLET BY MOUTH THREE TIMES A DAY AS NEEDED FOR VERTIGO OR NAUSEA  . warfarin (COUMADIN) 3 MG tablet TAKE 1-2 TABLETS DAILY AS DIRECTED   No current facility-administered medications on file prior to visit.     ROS: all negative except above.   Physical Exam:  BP (!) 108/46   Pulse 67   Temp 97.7 F (36.5 C)   Ht 5' 3.5" (1.613 m)   Wt 202 lb (91.6 kg)   SpO2 97%   BMI 35.22 kg/m   General Appearance: Well nourished, in no acute distress though appears to be feeling unwell.  Eyes: PERRLA, EOMs, conjunctiva no swelling or erythema ENT/Mouth:  No erythema, swelling, or exudate on post pharynx.  Tonsils not swollen or erythematous. Hearing normal.  Neck: Supple, thyroid normal.  Respiratory: Respiratory effort increased, pursed lip breathing, BS somewhat diminished throughout  without audible  rales, rhonchi, wheezing or stridor.  Cardio: Rhythm irregular, normal rate with 3/6 systolic murmur. Thready peripheral pulses upper and lower extremities without edema.  Abdomen: Soft, + BS.  Non tender, no guarding, rebound, palpable hernias or masses. Lymphatics: Non tender without lymphadenopathy.  Musculoskeletal: Full ROM, 5/5 strength, slow gait.  Skin: Warm, very dry without rashes, lesions, ecchymosis.  Psych: Awake and oriented X 3, normal affect, Insight and Judgment appropriate.     Izora Ribas, NP 11:05 AM [facility]

## 2017-06-01 ENCOUNTER — Other Ambulatory Visit: Payer: Self-pay

## 2017-06-01 ENCOUNTER — Emergency Department (HOSPITAL_COMMUNITY)
Admission: EM | Admit: 2017-06-01 | Discharge: 2017-06-01 | Disposition: A | Discharge status: Home / Self Care | Payer: Medicare Other | Attending: Emergency Medicine | Admitting: Emergency Medicine

## 2017-06-01 ENCOUNTER — Encounter (HOSPITAL_COMMUNITY): Payer: Self-pay

## 2017-06-01 ENCOUNTER — Ambulatory Visit (INDEPENDENT_AMBULATORY_CARE_PROVIDER_SITE_OTHER): Payer: Medicare Other | Admitting: Adult Health

## 2017-06-01 ENCOUNTER — Encounter: Payer: Self-pay | Admitting: Adult Health

## 2017-06-01 ENCOUNTER — Emergency Department (HOSPITAL_COMMUNITY): Payer: Medicare Other

## 2017-06-01 VITALS — BP 105/45 | HR 74 | Temp 97.7°F | Resp 22 | Ht 63.5 in | Wt 202.0 lb

## 2017-06-01 DIAGNOSIS — J449 Chronic obstructive pulmonary disease, unspecified: Secondary | ICD-10-CM

## 2017-06-01 DIAGNOSIS — N183 Chronic kidney disease, stage 3 unspecified: Secondary | ICD-10-CM

## 2017-06-01 DIAGNOSIS — I5022 Chronic systolic (congestive) heart failure: Secondary | ICD-10-CM | POA: Diagnosis not present

## 2017-06-01 DIAGNOSIS — I482 Chronic atrial fibrillation, unspecified: Secondary | ICD-10-CM

## 2017-06-01 DIAGNOSIS — Z79899 Other long term (current) drug therapy: Secondary | ICD-10-CM | POA: Insufficient documentation

## 2017-06-01 DIAGNOSIS — Z87891 Personal history of nicotine dependence: Secondary | ICD-10-CM | POA: Insufficient documentation

## 2017-06-01 DIAGNOSIS — E039 Hypothyroidism, unspecified: Secondary | ICD-10-CM | POA: Insufficient documentation

## 2017-06-01 DIAGNOSIS — E860 Dehydration: Secondary | ICD-10-CM | POA: Diagnosis not present

## 2017-06-01 DIAGNOSIS — I130 Hypertensive heart and chronic kidney disease with heart failure and stage 1 through stage 4 chronic kidney disease, or unspecified chronic kidney disease: Secondary | ICD-10-CM | POA: Insufficient documentation

## 2017-06-01 DIAGNOSIS — I9589 Other hypotension: Secondary | ICD-10-CM | POA: Diagnosis not present

## 2017-06-01 DIAGNOSIS — E861 Hypovolemia: Secondary | ICD-10-CM | POA: Diagnosis not present

## 2017-06-01 DIAGNOSIS — R197 Diarrhea, unspecified: Secondary | ICD-10-CM

## 2017-06-01 DIAGNOSIS — R531 Weakness: Secondary | ICD-10-CM | POA: Diagnosis not present

## 2017-06-01 DIAGNOSIS — Z7982 Long term (current) use of aspirin: Secondary | ICD-10-CM | POA: Diagnosis not present

## 2017-06-01 DIAGNOSIS — Z7901 Long term (current) use of anticoagulants: Secondary | ICD-10-CM | POA: Diagnosis not present

## 2017-06-01 DIAGNOSIS — I959 Hypotension, unspecified: Secondary | ICD-10-CM | POA: Diagnosis not present

## 2017-06-01 DIAGNOSIS — I2510 Atherosclerotic heart disease of native coronary artery without angina pectoris: Secondary | ICD-10-CM | POA: Insufficient documentation

## 2017-06-01 LAB — URINALYSIS, ROUTINE W REFLEX MICROSCOPIC
BILIRUBIN URINE: NEGATIVE
Glucose, UA: NEGATIVE mg/dL
HGB URINE DIPSTICK: NEGATIVE
KETONES UR: NEGATIVE mg/dL
Nitrite: NEGATIVE
PROTEIN: NEGATIVE mg/dL
Specific Gravity, Urine: 1.019 (ref 1.005–1.030)
pH: 5 (ref 5.0–8.0)

## 2017-06-01 LAB — COMPREHENSIVE METABOLIC PANEL
ALBUMIN: 3.7 g/dL (ref 3.5–5.0)
ALK PHOS: 35 U/L — AB (ref 38–126)
ALT: 22 U/L (ref 14–54)
ANION GAP: 14 (ref 5–15)
AST: 19 U/L (ref 15–41)
BILIRUBIN TOTAL: 0.7 mg/dL (ref 0.3–1.2)
BUN: 12 mg/dL (ref 6–20)
CO2: 22 mmol/L (ref 22–32)
Calcium: 9.5 mg/dL (ref 8.9–10.3)
Chloride: 105 mmol/L (ref 101–111)
Creatinine, Ser: 1.27 mg/dL — ABNORMAL HIGH (ref 0.44–1.00)
GFR calc Af Amer: 47 mL/min — ABNORMAL LOW (ref >60)
GFR calc non Af Amer: 40 mL/min — ABNORMAL LOW (ref >60)
GLUCOSE: 97 mg/dL (ref 65–99)
Potassium: 4.7 mmol/L (ref 3.5–5.1)
Sodium: 141 mmol/L (ref 135–145)
Total Protein: 6.8 g/dL (ref 6.5–8.1)

## 2017-06-01 LAB — I-STAT CHEM 8, ED
BUN: 14 mg/dL (ref 6–20)
CALCIUM ION: 1.21 mmol/L (ref 1.15–1.40)
CHLORIDE: 103 mmol/L (ref 101–111)
Creatinine, Ser: 1.2 mg/dL — ABNORMAL HIGH (ref 0.44–1.00)
Glucose, Bld: 88 mg/dL (ref 65–99)
HCT: 49 % — ABNORMAL HIGH (ref 36.0–46.0)
Hemoglobin: 16.7 g/dL — ABNORMAL HIGH (ref 12.0–15.0)
POTASSIUM: 4.1 mmol/L (ref 3.5–5.1)
Sodium: 140 mmol/L (ref 135–145)
TCO2: 26 mmol/L (ref 22–32)

## 2017-06-01 LAB — CBC
HEMATOCRIT: 47.6 % — AB (ref 36.0–46.0)
Hemoglobin: 15.7 g/dL — ABNORMAL HIGH (ref 12.0–15.0)
MCH: 30.7 pg (ref 26.0–34.0)
MCHC: 33 g/dL (ref 30.0–36.0)
MCV: 93.2 fL (ref 78.0–100.0)
Platelets: 213 10*3/uL (ref 150–400)
RBC: 5.11 MIL/uL (ref 3.87–5.11)
RDW: 16.8 % — ABNORMAL HIGH (ref 11.5–15.5)
WBC: 8.5 10*3/uL (ref 4.0–10.5)

## 2017-06-01 LAB — I-STAT CG4 LACTIC ACID, ED: Lactic Acid, Venous: 1.27 mmol/L (ref 0.5–1.9)

## 2017-06-01 LAB — LIPASE, BLOOD: Lipase: 28 U/L (ref 11–51)

## 2017-06-01 MED ORDER — SODIUM CHLORIDE 0.9 % IV BOLUS (SEPSIS)
1000.0000 mL | Freq: Once | INTRAVENOUS | Status: AC
  Administered 2017-06-01: 1000 mL via INTRAVENOUS

## 2017-06-01 NOTE — Patient Instructions (Signed)
Hold lasix until instructed by cardiology ok to restart - ask what dose you should start back on   Take synthroid/ levothyroxine in the morning 60 min before anything else other than water.     Levothyroxine tablets What is this medicine? LEVOTHYROXINE (lee voe thye ROX een) is a thyroid hormone. This medicine can improve symptoms of thyroid deficiency such as slow speech, lack of energy, weight gain, hair loss, dry skin, and feeling cold. It also helps to treat goiter (an enlarged thyroid gland). It is also used to treat some kinds of thyroid cancer along with surgery and other medicines. This medicine may be used for other purposes; ask your health care provider or pharmacist if you have questions. COMMON BRAND NAME(S): Estre, Levo-T, Levothroid, Levoxyl, Synthroid, Thyro-Tabs, Unithroid What should I tell my health care provider before I take this medicine? They need to know if you have any of these conditions: -angina -blood clotting problems -diabetes -dieting or on a weight loss program -fertility problems -heart disease -high levels of thyroid hormone -pituitary gland problem -previous heart attack -an unusual or allergic reaction to levothyroxine, thyroid hormones, other medicines, foods, dyes, or preservatives -pregnant or trying to get pregnant -breast-feeding How should I use this medicine? Take this medicine by mouth with plenty of water. It is best to take on an empty stomach, at least 30 minutes before or 2 hours after food. Follow the directions on the prescription label. Take at the same time each day. Do not take your medicine more often than directed. Contact your pediatrician regarding the use of this medicine in children. While this drug may be prescribed for children and infants as young as a few days of age for selected conditions, precautions do apply. For infants, you may crush the tablet and place in a small amount of (5-10 ml or 1 to 2 teaspoonfuls) of water,  breast milk, or non-soy based infant formula. Do not mix with soy-based infant formula. Give as directed. Overdosage: If you think you have taken too much of this medicine contact a poison control center or emergency room at once. NOTE: This medicine is only for you. Do not share this medicine with others. What if I miss a dose? If you miss a dose, take it as soon as you can. If it is almost time for your next dose, take only that dose. Do not take double or extra doses. What may interact with this medicine? -amiodarone -antacids -anti-thyroid medicines -calcium supplements -carbamazepine -cholestyramine -colestipol -digoxin -female hormones, including contraceptive or birth control pills -iron supplements -ketamine -liquid nutrition products like Ensure -medicines for colds and breathing difficulties -medicines for diabetes -medicines for mental depression -medicines or herbals used to decrease weight or appetite -phenobarbital or other barbiturate medications -phenytoin -prednisone or other corticosteroids -rifabutin -rifampin -soy isoflavones -sucralfate -theophylline -warfarin This list may not describe all possible interactions. Give your health care provider a list of all the medicines, herbs, non-prescription drugs, or dietary supplements you use. Also tell them if you smoke, drink alcohol, or use illegal drugs. Some items may interact with your medicine. What should I watch for while using this medicine? Be sure to take this medicine with plenty of fluids. Some tablets may cause choking, gagging, or difficulty swallowing from the tablet getting stuck in your throat. Most of these problems disappear if the medicine is taken with the right amount of water or other fluids. Do not switch brands of this medicine unless your health care professional agrees with  the change. Ask questions if you are uncertain. You will need regular exams and occasional blood tests to check the  response to treatment. If you are receiving this medicine for an underactive thyroid, it may be several weeks before you notice an improvement. Check with your doctor or health care professional if your symptoms do not improve. It may be necessary for you to take this medicine for the rest of your life. Do not stop using this medicine unless your doctor or health care professional advises you to. This medicine can affect blood sugar levels. If you have diabetes, check your blood sugar as directed. You may lose some of your hair when you first start treatment. With time, this usually corrects itself. If you are going to have surgery, tell your doctor or health care professional that you are taking this medicine. What side effects may I notice from receiving this medicine? Side effects that you should report to your doctor or health care professional as soon as possible: -allergic reactions like skin rash, itching or hives, swelling of the face, lips, or tongue -chest pain -excessive sweating or intolerance to heat -fast or irregular heartbeat -nervousness -skin rash or hives -swelling of ankles, feet, or legs -tremors Side effects that usually do not require medical attention (report to your doctor or health care professional if they continue or are bothersome): -changes in appetite -changes in menstrual periods -diarrhea -hair loss -headache -trouble sleeping -weight loss This list may not describe all possible side effects. Call your doctor for medical advice about side effects. You may report side effects to FDA at 1-800-FDA-1088. Where should I keep my medicine? Keep out of the reach of children. Store at room temperature between 15 and 30 degrees C (59 and 86 degrees F). Protect from light and moisture. Keep container tightly closed. Throw away any unused medicine after the expiration date. NOTE: This sheet is a summary. It may not cover all possible information. If you have questions  about this medicine, talk to your doctor, pharmacist, or health care provider.  2018 [employer] (2008-06-07 14:28:07)

## 2017-06-01 NOTE — ED Notes (Signed)
Pt stable, ambulatory, states understanding of discharge instructions 

## 2017-06-01 NOTE — ED Provider Notes (Signed)
[facility] EMERGENCY DEPARTMENT Provider Note   CSN: [id number] Arrival date & time: 06/01/17  1141     History   Chief Complaint Chief Complaint  Patient presents with  . Dehydration    HPI Katie [name] is a 77 y.o. female.  77 yo F with a chief complaint of nausea vomiting and diarrhea.  This went on for a couple days and is resolved.  The patient has been eating and drinking as well as last night.  She went for her INR checked today and her diastolic blood pressure was noted to be low and so she was sent to the emergency department for IV fluids.  Patient denies any current complaint denies abdominal pain denies ongoing diarrhea or vomiting.  Denies fevers or chills.  Has a mild cough that she was recently admitted to the hospital for the flu.  She also is mildly weak since her admission but she thinks that she has been getting stronger every day.  She denies lower extremity edema denies dark stool or blood in her stool.   The history is provided by the patient.  Illness  This is a new problem. The current episode started more than 2 days ago. The problem occurs constantly. The problem has been resolved. Pertinent negatives include no chest pain, no headaches and no shortness of breath. Nothing aggravates the symptoms. Nothing relieves the symptoms. She has tried nothing for the symptoms. The treatment provided no relief.    Past Medical History:  Diagnosis Date  . Atrial fibrillation ([place])   . Migraine headache   . Osteopenia     Patient Active Problem List   Diagnosis Date Noted  . Esophageal dysphagia 05/15/2017  . Gout 03/20/2017  . Chronic systolic CHF (congestive heart failure) ([place]) 07/19/2016  . Other abnormal glucose 09/19/2015  . AAA (abdominal aortic aneurysm) without rupture ([place]) 04/22/2015  . Morbid obesity due to excess calories ([place]) 08/03/2014  . PVD (peripheral vascular disease) with claudication ([place]) 10/16/2013  . CKD  stage III  (GFR 51 ml/min) 06/08/2013  . Hyperlipidemia 06/08/2013  . Medication management 06/08/2013  . 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 Long term current use of anticoagulant therapy 04/23/2013  . Vitamin D deficiency 02/15/2013  . Hypothyroidism   . Osteopenia   . Congestive heart failure ([place]) 11/25/2008  . Migraine headache 11/22/2008  . Essential hypertension 11/22/2008  . Coronary atherosclerosis 11/22/2008  . Atrial fibrillation ([place]) 11/22/2008  . COPD (chronic obstructive pulmonary disease) with chronic bronchitis ([place]) 11/22/2008  . GERD 11/22/2008    Past Surgical History:  Procedure Laterality Date  . ABDOMINAL AORTIC ENDOVASCULAR STENT GRAFT N/A 04/11/2013   Procedure: ABDOMINAL AORTIC ENDOVASCULAR STENT GRAFT WITH RIGHT FEMORAL PATCH ANGIOPLASTY;  Surgeon: Mal Misty, MD;  Location: [facility];  Service: Vascular;  Laterality: N/A;  . BREAST SURGERY     LEFT BREAST BIOPSY  . broken leg Left 1970s  . CARDIAC CATHETERIZATION    . CHOLECYSTECTOMY  1972  . COLONOSCOPY    . EYE SURGERY Bilateral   . Laser vein procedure    . REFRACTIVE SURGERY    . TONSILLECTOMY      OB History    Gravida Para Term Preterm AB Living   4 3 3   1 3    SAB TAB Ectopic Multiple Live Births                   Home Medications    Prior  to Admission medications   Medication Sig Start Date End Date Taking? Authorizing Provider  acetaminophen (TYLENOL) 500 MG tablet Take 1,000 mg by mouth daily.     [provider]  albuterol (PROVENTIL HFA;VENTOLIN HFA) 108 (90 Base) MCG/ACT inhaler Inhale 2 puffs into the lungs every 4 (four) hours as needed for shortness of breath. 2 puffs 4- 6 hrs to rescue asthma 05/10/17   Patrecia Pour, Christean Grief, MD  allopurinol (ZYLOPRIM) 300 MG tablet Take 300 mg by mouth daily.    [provider]  aspirin EC 81 MG tablet Take 81 mg by mouth daily.    [provider]  benzonatate (TESSALON) 200 MG capsule Take 1 perle 3 x /  day to prevent cough Patient taking differently: as needed. Take 1 perle 3 x / day to prevent cough 05/12/17   [name], MD  budesonide (PULMICORT) 0.25 MG/2ML nebulizer solution Take 2 mLs (0.25 mg total) by nebulization 2 (two) times daily. 05/16/17   Liane Comber, NP  Cholecalciferol (VITAMIN D3) 5000 units CAPS Takes 2 caps (10,000 units) daily Patient taking differently: Take 5,000 Units by mouth daily.  01/09/17   [name], MD  digoxin (LANOXIN) 0.125 MG tablet Take 1 tablet daily for Heart Rhythm Patient taking differently: Take 0.125 mg by mouth daily. Take 1 tablet daily for Heart Rhythm 04/26/17 04/26/18  [name], MD  diltiazem [facility]) 120 MG 24 hr capsule TAKE 1 CAPSULE BY MOUTH EVERY DAY Patient taking differently: TAKE 1/2 CAPSULE BY MOUTH EVERY DAY 12/06/16   Vicie Mutters, PA-C  furosemide (LASIX) 80 MG tablet TAKE 1 TABLET BY MOUTH TWICE A DAY AS NEEDED FOR FLUID Patient taking differently: Take 80 mg by mouth daily. TAKE 1 TABLET BY MOUTH TWICE A DAY AS NEEDED FOR FLUI 01/19/17   [name], MD  gabapentin (NEURONTIN) 600 MG tablet Takes 1/2 to 1 tablet 3 times daily. Patient taking differently: Take 600 mg by mouth 3 (three) times daily.  11/04/16   [name], MD  guaiFENesin-dextromethorphan (ROBITUSSIN DM) 100-10 MG/5ML syrup Take 10 mLs by mouth every 4 (four) hours as needed for cough. 05/10/17   Doreatha Lew, MD  levothyroxine (SYNTHROID, LEVOTHROID) 50 MCG tablet TAKE 1 TABLET BY MOUTH EVERY DAY Patient taking differently: Take 1.5 tablets on all days 01/30/17   [name], MD  mirabegron ER (MYRBETRIQ) 25 MG TB24 tablet Take 1 tablet (25 mg total) by mouth daily. Patient taking differently: Take 25 mg by mouth as needed.  05/11/17   Patrecia Pour, Christean Grief, MD  montelukast (SINGULAIR) 10 MG tablet TAKE 1 TABLET EVERY DAY FOR ALLERGIES 12/28/16   [name], MD  Olopatadine HCl 0.2 % SOLN Place 1 drop into both eyes daily.      [provider]  pantoprazole (PROTONIX) 40 MG tablet Take 1 tablet (40 mg total) by mouth daily. 07/16/16   Ghimire, Henreitta Leber, MD  Polyethyl Glycol-Propyl Glycol (SYSTANE OP) Apply 1 drop to eye 4 (four) times daily.     [provider]  potassium chloride SA (KLOR-CON M20) 20 MEQ tablet TAKE 2 TABLETS (40 MEQ TOTAL) BY MOUTH DAILY. Patient taking differently: Take 40 mEq by mouth daily.  04/11/17   Vicie Mutters, PA-C  pravastatin (PRAVACHOL) 40 MG tablet TAKE 1 TABLET BY MOUTH AT BEDTIME FOR CHOLESTEROL 06/30/16   [name], MD  prednisoLONE acetate (PRED FORTE) 1 % ophthalmic suspension INSTILL 1 DROP INTO BOTH EYES FOUR TIMES A DAY FOR 10 DAYS AND THEN  TWICE DAY. 04/27/17   [provider]  prochlorperazine (COMPAZINE) 5 MG tablet TAKE 1 TABLET BY MOUTH THREE TIMES A DAY AS NEEDED FOR VERTIGO OR NAUSEA 05/27/17   [name], MD  warfarin (COUMADIN) 3 MG tablet TAKE 1-2 TABLETS DAILY AS DIRECTED 05/12/17   Vicie Mutters, PA-C    Family History Family History  Problem Relation Age of Onset  . Cirrhosis Mother   . Cancer Mother 60       PANCREAS  . Heart defect Sister   . Breast cancer Sister        age 34  . Heart disease Sister   . Stroke Sister   . Alcohol abuse Father   . Depression Father   . Hypertension Brother   . Hyperlipidemia Son   . Heart disease Daughter     Social History Social History   Tobacco Use  . Smoking status: Former Smoker    Types: Cigarettes    Last attempt to quit: 08/10/2002    Years since quitting: 14.8  . Smokeless tobacco: Never Used  . Tobacco comment: History of tobacco abuse  Substance Use Topics  . Alcohol use: Yes    Alcohol/week: 0.0 oz    Comment: rare  . Drug use: No     Allergies   Amiodarone; Diovan [valsartan]; Doxycycline; Flexeril [cyclobenzaprine]; Keflex [cephalexin]; Verapamil; and Codeine   Review of Systems Review of Systems  Constitutional: Negative for chills and fever.    HENT: Negative for congestion and rhinorrhea.   Eyes: Negative for redness and visual disturbance.  Respiratory: Negative for shortness of breath and wheezing.   Cardiovascular: Negative for chest pain and palpitations.  Gastrointestinal: Positive for diarrhea and nausea. Negative for vomiting.  Genitourinary: Negative for dysuria and urgency.  Musculoskeletal: Negative for arthralgias and myalgias.  Skin: Negative for pallor and wound.  Neurological: Negative for dizziness and headaches.     Physical Exam Updated Vital Signs BP 117/87   Pulse 69   Temp 98 F (36.7 C) (Oral)   Resp 16   Wt 91.6 kg (202 lb)   SpO2 99%   BMI 35.22 kg/m   Physical Exam  Constitutional: She is oriented to person, place, and time. She appears well-developed and well-nourished. No distress.  HENT:  Head: Normocephalic and atraumatic.  Eyes: EOM are normal. Pupils are equal, round, and reactive to light.  Neck: Normal range of motion. Neck supple.  Cardiovascular: Normal rate and regular rhythm. Exam reveals no gallop and no friction rub.  No murmur heard. Pulmonary/Chest: Effort normal. She has no wheezes. She has no rales.  Abdominal: Soft. She exhibits no distension and no mass. There is no tenderness. There is no guarding.  Musculoskeletal: She exhibits no edema or tenderness.  Neurological: She is alert and oriented to person, place, and time.  Skin: Skin is warm and dry. She is not diaphoretic.  Psychiatric: She has a normal mood and affect. Her behavior is normal.  Nursing note and vitals reviewed.    ED Treatments / Results  Labs (all labs ordered are listed, but only abnormal results are displayed) Labs Reviewed  COMPREHENSIVE METABOLIC PANEL - Abnormal; Notable for the following components:      Result Value   Creatinine, Ser 1.27 (*)    Alkaline Phosphatase 35 (*)    GFR calc non Af Amer 40 (*)    GFR calc Af Amer 47 (*)    All other components within normal limits  CBC -  Abnormal;  Notable for the following components:   Hemoglobin 15.7 (*)    HCT 47.6 (*)    RDW 16.8 (*)    All other components within normal limits  URINALYSIS, ROUTINE W REFLEX MICROSCOPIC - Abnormal; Notable for the following components:   APPearance HAZY (*)    Leukocytes, UA SMALL (*)    Bacteria, UA RARE (*)    Squamous Epithelial / LPF 6-30 (*)    All other components within normal limits  I-STAT CHEM 8, ED - Abnormal; Notable for the following components:   Creatinine, Ser 1.20 (*)    Hemoglobin 16.7 (*)    HCT 49.0 (*)    All other components within normal limits  LIPASE, BLOOD  I-STAT CG4 LACTIC ACID, ED    EKG  EKG Interpretation None       Radiology Dg Chest 2 View  Result Date: 06/01/2017 CLINICAL DATA:  Weakness. EXAM: CHEST - 2 VIEW COMPARISON:  One-view chest x-ray 05/07/2017 FINDINGS: The heart is enlarged. Atherosclerotic changes are again noted at the aortic arch. Chronic interstitial coarsening is present without significant edema or effusion. No focal airspace consolidation is present. Stent graft is noted within the abdominal aorta. The visualized soft tissues and bony thorax are otherwise unremarkable. IMPRESSION: 1. Cardiomegaly without failure. 2. Stable chronic interstitial disease. 3. No acute cardiopulmonary disease. Electronically Signed   By: San Morelle M.D.   On: 06/01/2017 16:51    Procedures Procedures (including critical care time)  Medications Ordered in ED Medications  sodium chloride 0.9 % bolus 1,000 mL (0 mLs Intravenous Stopped 06/01/17 1707)     Initial Impression / Assessment and Plan / ED Course  I have reviewed the triage vital signs and the nursing notes.  Pertinent labs & imaging results that were available during my care of the patient were reviewed by me and considered in my medical decision making (see chart for details).     77 yo F with a chief complaint of diastolic hypertension.  This was noted at the  doctor's office.  Blood pressures here is been normal.  Will check chest x-ray labs urine give fluids and reevaluate.  Patient unchanged.  Requesting D/c.  11:46 PM:  I have discussed the diagnosis/risks/treatment options with the patient and family and believe the pt to be eligible for discharge home to follow-up with PCP. We also discussed returning to the ED immediately if new or worsening sx occur. We discussed the sx which are most concerning (e.g., sudden worsening pain, fever, inability to tolerate by mouth) that necessitate immediate return. Medications administered to the patient during their visit and any new prescriptions provided to the patient are listed below.  Medications given during this visit Medications  sodium chloride 0.9 % bolus 1,000 mL (0 mLs Intravenous Stopped 06/01/17 1707)     The patient appears reasonably screen and/or stabilized for discharge and I doubt any other medical condition or other [facility] requiring further screening, evaluation, or treatment in the ED at this time prior to discharge.    Final Clinical Impressions(s) / ED Diagnoses   Final diagnoses:  Diarrhea in adult patient  Hypotension due to hypovolemia    ED Discharge Orders    None       Deno Etienne, DO 06/01/17 2346

## 2017-06-01 NOTE — Discharge Instructions (Signed)
Follow up with your PCP.  Eat and drink well for the next couple of days.  Return for worsening symptoms

## 2017-06-01 NOTE — ED Notes (Signed)
Patient transported to X-ray 

## 2017-06-01 NOTE — ED Triage Notes (Signed)
Pt presents to the ed from home, was seen at her doctors office today and being told her blood pressure was low so she was sent here. She was recently admitted to the icu for the flu and had an episode of diarrhea, states she feels dehydrated and nauseous.

## 2017-06-02 ENCOUNTER — Telehealth: Payer: Self-pay

## 2017-06-02 ENCOUNTER — Other Ambulatory Visit: Payer: Self-pay | Admitting: Adult Health

## 2017-06-02 DIAGNOSIS — I480 Paroxysmal atrial fibrillation: Secondary | ICD-10-CM

## 2017-06-02 DIAGNOSIS — N183 Chronic kidney disease, stage 3 unspecified: Secondary | ICD-10-CM

## 2017-06-02 LAB — PROTIME-INR
INR: 1.3 — AB
PROTHROMBIN TIME: 13.4 s — AB (ref 9.0–11.5)

## 2017-06-02 NOTE — Telephone Encounter (Signed)
Patient states that she has only taken her thyroid medication this morning and she needs to know what meds to take and if she can take her fluid pill? BP this morning was 141/92, Pulse at 116.   Also, patient states that she forgot to tell you and the hospital that she has had black stools since Tuesday, no bright blood . Said she was so frustrated it slipped her mind.

## 2017-06-03 NOTE — Telephone Encounter (Signed)
Patient had been taking Pepto-Bismol and will stop that now. States that she feels so much better today. Patient will start taking the Protonix twice daily over the weekend as well. Strong ER precautions were also suggested. And hasn't seen a GI doctor in about 10-15 years when she had her colonoscopy done.

## 2017-06-03 NOTE — Telephone Encounter (Signed)
LMTCB

## 2017-06-03 NOTE — Telephone Encounter (Signed)
Noted  

## 2017-06-06 DIAGNOSIS — I4891 Unspecified atrial fibrillation: Secondary | ICD-10-CM | POA: Diagnosis not present

## 2017-06-06 DIAGNOSIS — I509 Heart failure, unspecified: Secondary | ICD-10-CM | POA: Diagnosis not present

## 2017-06-06 DIAGNOSIS — I130 Hypertensive heart and chronic kidney disease with heart failure and stage 1 through stage 4 chronic kidney disease, or unspecified chronic kidney disease: Secondary | ICD-10-CM | POA: Diagnosis not present

## 2017-06-06 DIAGNOSIS — J441 Chronic obstructive pulmonary disease with (acute) exacerbation: Secondary | ICD-10-CM | POA: Diagnosis not present

## 2017-06-06 DIAGNOSIS — Z7901 Long term (current) use of anticoagulants: Secondary | ICD-10-CM | POA: Diagnosis not present

## 2017-06-06 DIAGNOSIS — N189 Chronic kidney disease, unspecified: Secondary | ICD-10-CM | POA: Diagnosis not present

## 2017-06-07 ENCOUNTER — Other Ambulatory Visit: Payer: Self-pay | Admitting: *Deleted

## 2017-06-07 MED ORDER — GABAPENTIN 600 MG PO TABS: ORAL_TABLET | ORAL | 1 refills | Status: DC

## 2017-06-08 ENCOUNTER — Ambulatory Visit (INDEPENDENT_AMBULATORY_CARE_PROVIDER_SITE_OTHER): Payer: Medicare Other | Admitting: Physician Assistant

## 2017-06-08 DIAGNOSIS — J441 Chronic obstructive pulmonary disease with (acute) exacerbation: Secondary | ICD-10-CM | POA: Diagnosis not present

## 2017-06-08 DIAGNOSIS — Z7901 Long term (current) use of anticoagulants: Secondary | ICD-10-CM | POA: Diagnosis not present

## 2017-06-08 DIAGNOSIS — I4891 Unspecified atrial fibrillation: Secondary | ICD-10-CM | POA: Diagnosis not present

## 2017-06-08 DIAGNOSIS — I509 Heart failure, unspecified: Secondary | ICD-10-CM | POA: Diagnosis not present

## 2017-06-08 DIAGNOSIS — N189 Chronic kidney disease, unspecified: Secondary | ICD-10-CM | POA: Diagnosis not present

## 2017-06-08 DIAGNOSIS — I130 Hypertensive heart and chronic kidney disease with heart failure and stage 1 through stage 4 chronic kidney disease, or unspecified chronic kidney disease: Secondary | ICD-10-CM | POA: Diagnosis not present

## 2017-06-09 ENCOUNTER — Ambulatory Visit: Payer: Medicare Other

## 2017-06-09 DIAGNOSIS — N183 Chronic kidney disease, stage 3 unspecified: Secondary | ICD-10-CM

## 2017-06-09 DIAGNOSIS — I480 Paroxysmal atrial fibrillation: Secondary | ICD-10-CM | POA: Diagnosis not present

## 2017-06-09 NOTE — Progress Notes (Signed)
Pt presents for ProTime, BMET w/ GFR, & to go over her med list. A med list was printed & the pt & I went over her meds & how she should she be taking the meds.     Pt also reported how she is taking her warfarin(COUMADIN) as follows: on 4 days of the wk- 1 whole tablet & on 3 days of the wk-1/2 tablet. Pt stated that she taking her THYROID meds on an empty w/only water & waiting 33mins before food  Pt also states she was on an ABX & prednisone & she feels like this has made her PROTIME numbers high & she wanted the provider to know that as well.   Pt voiced understanding & headed to the lab to have her blood drawn.

## 2017-06-10 ENCOUNTER — Other Ambulatory Visit: Payer: Self-pay | Admitting: Adult Health

## 2017-06-10 DIAGNOSIS — I4891 Unspecified atrial fibrillation: Secondary | ICD-10-CM

## 2017-06-10 LAB — BASIC METABOLIC PANEL WITH GFR
BUN/Creatinine Ratio: 14 (calc) (ref 6–22)
BUN: 16 mg/dL (ref 7–25)
CHLORIDE: 102 mmol/L (ref 98–110)
CO2: 28 mmol/L (ref 20–32)
CREATININE: 1.12 mg/dL — AB (ref 0.60–0.93)
Calcium: 10 mg/dL (ref 8.6–10.4)
GFR, Est African American: 56 mL/min/{1.73_m2} — ABNORMAL LOW (ref >=60)
GFR, Est Non African American: 48 mL/min/{1.73_m2} — ABNORMAL LOW (ref >=60)
GLUCOSE: 83 mg/dL (ref 65–99)
Potassium: 4.4 mmol/L (ref 3.5–5.3)
SODIUM: 141 mmol/L (ref 135–146)

## 2017-06-10 LAB — PROTIME-INR
INR: 1.4 — ABNORMAL HIGH
PROTHROMBIN TIME: 14.9 s — AB (ref 9.0–11.5)

## 2017-06-13 DIAGNOSIS — I509 Heart failure, unspecified: Secondary | ICD-10-CM | POA: Diagnosis not present

## 2017-06-13 DIAGNOSIS — J441 Chronic obstructive pulmonary disease with (acute) exacerbation: Secondary | ICD-10-CM | POA: Diagnosis not present

## 2017-06-13 DIAGNOSIS — I130 Hypertensive heart and chronic kidney disease with heart failure and stage 1 through stage 4 chronic kidney disease, or unspecified chronic kidney disease: Secondary | ICD-10-CM | POA: Diagnosis not present

## 2017-06-13 DIAGNOSIS — Z7901 Long term (current) use of anticoagulants: Secondary | ICD-10-CM | POA: Diagnosis not present

## 2017-06-13 DIAGNOSIS — I4891 Unspecified atrial fibrillation: Secondary | ICD-10-CM | POA: Diagnosis not present

## 2017-06-13 DIAGNOSIS — N189 Chronic kidney disease, unspecified: Secondary | ICD-10-CM | POA: Diagnosis not present

## 2017-06-15 ENCOUNTER — Other Ambulatory Visit: Payer: Self-pay | Admitting: Internal Medicine

## 2017-06-15 ENCOUNTER — Telehealth: Payer: Self-pay | Admitting: *Deleted

## 2017-06-15 NOTE — Telephone Encounter (Signed)
Patient called and states she is having allergy symptoms of dry cough, itchy eyes and runny nose.  Per Dr Melford Aase, she should continue her Singulair and add OTC Zyrtec.  Per Dr Melford Aase, she will need OV, if no improvement.

## 2017-06-16 DIAGNOSIS — I509 Heart failure, unspecified: Secondary | ICD-10-CM | POA: Diagnosis not present

## 2017-06-16 DIAGNOSIS — Z7901 Long term (current) use of anticoagulants: Secondary | ICD-10-CM | POA: Diagnosis not present

## 2017-06-16 DIAGNOSIS — N189 Chronic kidney disease, unspecified: Secondary | ICD-10-CM | POA: Diagnosis not present

## 2017-06-16 DIAGNOSIS — I4891 Unspecified atrial fibrillation: Secondary | ICD-10-CM | POA: Diagnosis not present

## 2017-06-16 DIAGNOSIS — J441 Chronic obstructive pulmonary disease with (acute) exacerbation: Secondary | ICD-10-CM | POA: Diagnosis not present

## 2017-06-16 DIAGNOSIS — I130 Hypertensive heart and chronic kidney disease with heart failure and stage 1 through stage 4 chronic kidney disease, or unspecified chronic kidney disease: Secondary | ICD-10-CM | POA: Diagnosis not present

## 2017-06-20 ENCOUNTER — Other Ambulatory Visit: Payer: Self-pay | Admitting: Physician Assistant

## 2017-06-21 ENCOUNTER — Ambulatory Visit: Payer: Medicare Other | Admitting: Physician Assistant

## 2017-06-21 ENCOUNTER — Encounter: Payer: Self-pay | Admitting: Physician Assistant

## 2017-06-21 VITALS — BP 106/58 | HR 61 | Temp 97.8°F | Resp 16 | Ht 63.5 in | Wt 201.6 lb

## 2017-06-21 DIAGNOSIS — I5022 Chronic systolic (congestive) heart failure: Secondary | ICD-10-CM

## 2017-06-21 DIAGNOSIS — N183 Chronic kidney disease, stage 3 unspecified: Secondary | ICD-10-CM

## 2017-06-21 DIAGNOSIS — J441 Chronic obstructive pulmonary disease with (acute) exacerbation: Secondary | ICD-10-CM | POA: Diagnosis not present

## 2017-06-21 DIAGNOSIS — I4891 Unspecified atrial fibrillation: Secondary | ICD-10-CM | POA: Diagnosis not present

## 2017-06-21 DIAGNOSIS — E039 Hypothyroidism, unspecified: Secondary | ICD-10-CM | POA: Diagnosis not present

## 2017-06-21 DIAGNOSIS — I739 Peripheral vascular disease, unspecified: Secondary | ICD-10-CM

## 2017-06-21 MED ORDER — DEXAMETHASONE SODIUM PHOSPHATE 100 MG/10ML IJ SOLN
10.0000 mg | Freq: Once | INTRAMUSCULAR | Status: AC
  Administered 2017-06-21: 10 mg via INTRAMUSCULAR

## 2017-06-21 MED ORDER — ALBUTEROL SULFATE (2.5 MG/3ML) 0.083% IN NEBU: 2.5000 mg | INHALATION_SOLUTION | Freq: Four times a day (QID) | RESPIRATORY_TRACT | 12 refills | Status: DC | PRN

## 2017-06-21 MED ORDER — LEVOFLOXACIN 500 MG PO TABS: 500.0000 mg | ORAL_TABLET | Freq: Every day | ORAL | 0 refills | Status: DC

## 2017-06-21 MED ORDER — IPRATROPIUM BROMIDE 0.02 % IN SOLN: 0.5000 mg | RESPIRATORY_TRACT | 2 refills | Status: DC | PRN

## 2017-06-21 NOTE — Progress Notes (Signed)
Assessment and Plan:   Atrial fibrillation, unspecified type ([place]) Monitor With ABX will check INR and adjust dose accordingly -     Protime-INR  CKD  stage III (GFR 51 ml/min)  avoid NSAIDS, monitor sugars, will monitor -     BASIC METABOLIC PANEL WITH GFR  COPD exacerbation (HCC) -     CBC with Differential/Platelet -     albuterol (PROVENTIL) (2.5 MG/3ML) 0.083% nebulizer solution; Take 3 mLs (2.5 mg total) by nebulization every 6 (six) hours as needed for wheezing or shortness of breath. -     ipratropium (ATROVENT) 0.02 % nebulizer solution; Take 2.5 mLs (0.5 mg total) by nebulization every 4 (four) hours as needed for wheezing or shortness of breath. -     dexamethasone (DECADRON) injection 10 mg -     levofloxacin (LEVAQUIN) 500 MG tablet; Take 1 tablet (500 mg total) by mouth daily. - she has seen Dr. Melvyn Novas in the past but does not want to follow up with him.   Hypothyroidism, unspecified type Hypothyroidism-check TSH level, continue medications the same, reminded to take on an empty stomach 30-79mins before food.  -     TSH  Chronic systolic CHF (congestive heart failure) (HCC) CHF Disease process and medications discussed. Questions answered fully. Emphasized salt restriction, less than 2000mg  a day. Encouraged daily monitoring of the patient's weight, call office if 5 lb weight loss or gain in a day.  Encouraged regular exercise. If any increasing shortness of breath, swelling, or chest pressure go to ER immediately.  decrease your fluid intake to less than 2 L daily please remember to always increase your potassium intake with any increase of your fluid pill.   PVD (peripheral vascular disease) with claudication (HCC) Continue ASA  Further disposition pending results of labs. Discussed med's effects and SE's.   Over 30 minutes of exam, counseling, chart review, and critical decision making was performed.   Future Appointments  Date Time Provider [place]   06/24/2017 11:30 AM [name], MD [facility] None  02/02/2018  9:00 AM [name], MD [facility] None    ------------------------------------------------------------------------------------------------------------------   HPI BP (!) 106/58   Pulse 61   Temp 97.8 F (36.6 C)   Resp 16   Ht 5' 3.5" (1.613 m)   Wt 201 lb 9.6 oz (91.4 kg)   SpO2 94%   BMI 35.15 kg/m   77 y.o.female presents for coumadin management for chronic a. Fib, htn, chronic systolic CHF, COPD, hypothyroidism, AAA, CKD 3 and morbid obesity.   She presents today reporting cough x 1 month, had a zpak a month ago and it has not improved. She is getting yellow/green mucus, no fever but has had some chills.   She also has hx AAA as well. She is prescribed digoxin 0.125 mg, diltiazem 120 mg ER tab, and takes lasix 80 mg daily for CHF. No worsening swelling, no PND, weight is stable.  BP has been better at home ranging from 106/66-135/78 without any dizziness.   Patient is on Coumadin for Atrial fibrillation, unspecified type ([place]) [I48.91] Patient's last INR is  Lab Results  Component Value Date   INR 1.4 (H) 06/09/2017   INR 1.3 (H) 06/01/2017   INR 1.3 (H) 05/23/2017    Patient denies SOB, CP, dizziness, nose bleeds, easy bleeding, and blood in stool/urine. Her coumadin dose was changed last visit, she is on 1 pill 6 days a week and 1/2 pill 1 day a week. She has not taken  ABX since the zpak 1 month ago, has not missed any doses and denies a fall.  She has a history of Systolic CHF, denies dyspnea on exertion, orthopnea, paroxysmal nocturnal dyspnea and edema. Positive for fatigue and tachypnea. Wt Readings from Last 3 Encounters:  06/21/17 201 lb 9.6 oz (91.4 kg)  06/09/17 203 lb (92.1 kg)  06/01/17 202 lb (91.6 kg)   She is on thyroid medication. Her medication was not changed last visit, she takes it in the AM, 1.5 pill daily.   Lab Results  Component Value Date   TSH 6.69 (H) 04/22/2017  .     Past Medical History:  Diagnosis Date  . Atrial fibrillation ([place])   . Migraine headache   . Osteopenia      Allergies  Allergen Reactions  . Amiodarone Other (See Comments)    PULMONARY TOXICITY  . Diovan [Valsartan] Other (See Comments)    HYPOTENSION  . Doxycycline Diarrhea and Other (See Comments)    VISUAL DISTURBANCE  . Flexeril [Cyclobenzaprine] Other (See Comments)    FATIGUE  . Keflex [Cephalexin] Diarrhea  . Verapamil Other (See Comments)    EDEMA  . Codeine Other (See Comments)    unknown    Current Outpatient Medications on File Prior to Visit  Medication Sig  . acetaminophen (TYLENOL) 500 MG tablet Take 1,000 mg by mouth daily.   Marland Kitchen albuterol (PROVENTIL HFA;VENTOLIN HFA) 108 (90 Base) MCG/ACT inhaler Inhale 2 puffs into the lungs every 4 (four) hours as needed for shortness of breath. 2 puffs 4- 6 hrs to rescue asthma  . allopurinol (ZYLOPRIM) 300 MG tablet Take 300 mg by mouth daily.  Marland Kitchen aspirin EC 81 MG tablet Take 81 mg by mouth daily.  . benzonatate (TESSALON) 200 MG capsule Take 1 perle 3 x / day to prevent cough (Patient taking differently: as needed. Take 1 perle 3 x / day to prevent cough)  . budesonide (PULMICORT) 0.25 MG/2ML nebulizer solution Take 2 mLs (0.25 mg total) by nebulization 2 (two) times daily.  . Cholecalciferol (VITAMIN D3) 5000 units CAPS Takes 2 caps (10,000 units) daily (Patient taking differently: Take 5,000 Units by mouth daily. )  . digoxin (LANOXIN) 0.125 MG tablet Take 1 tablet daily for Heart Rhythm (Patient taking differently: Take 0.125 mg by mouth daily. Take 1 tablet daily for Heart Rhythm)  . diltiazem (TIAZAC) 120 MG 24 hr capsule TAKE 1 CAPSULE BY MOUTH EVERY DAY  . furosemide (LASIX) 80 MG tablet TAKE 1 TABLET BY MOUTH TWICE A DAY AS NEEDED FOR FLUID (Patient taking differently: Take 80 mg by mouth daily. TAKE 1 TABLET BY MOUTH TWICE A DAY AS NEEDED FOR FLUI)  . gabapentin (NEURONTIN) 600 MG tablet Takes 1/2 to 1 tablet 3  times daily.  Marland Kitchen guaiFENesin-dextromethorphan (ROBITUSSIN DM) 100-10 MG/5ML syrup Take 10 mLs by mouth every 4 (four) hours as needed for cough.  . levothyroxine (SYNTHROID, LEVOTHROID) 50 MCG tablet TAKE 1 TABLET BY MOUTH EVERY DAY (Patient taking differently: Take 1.5 tablets on all days)  . mirabegron ER (MYRBETRIQ) 25 MG TB24 tablet Take 1 tablet (25 mg total) by mouth daily. (Patient taking differently: Take 25 mg by mouth as needed. )  . montelukast (SINGULAIR) 10 MG tablet TAKE 1 TABLET EVERY DAY FOR ALLERGIES  . Olopatadine HCl 0.2 % SOLN Place 1 drop into both eyes daily.   . pantoprazole (PROTONIX) 40 MG tablet Take 1 tablet (40 mg total) by mouth daily.  [name] Glycol-Propyl Glycol (SYSTANE  OP) Apply 1 drop to eye 4 (four) times daily.   . potassium chloride SA (KLOR-CON M20) 20 MEQ tablet TAKE 2 TABLETS (40 MEQ TOTAL) BY MOUTH DAILY. (Patient taking differently: Take 40 mEq by mouth daily. )  . pravastatin (PRAVACHOL) 40 MG tablet TAKE 1 TABLET BY MOUTH AT BEDTIME FOR CHOLESTEROL  . prednisoLONE acetate (PRED FORTE) 1 % ophthalmic suspension INSTILL 1 DROP INTO BOTH EYES FOUR TIMES A DAY FOR 10 DAYS AND THEN TWICE DAY.  Marland Kitchen prochlorperazine (COMPAZINE) 5 MG tablet TAKE 1 TABLET BY MOUTH THREE TIMES A DAY AS NEEDED FOR VERTIGO OR NAUSEA  . warfarin (COUMADIN) 3 MG tablet TAKE 1-2 TABLETS DAILY AS DIRECTED   No current facility-administered medications on file prior to visit.     ROS: all negative except above.   Physical Exam: BP (!) 106/58   Pulse 61   Temp 97.8 F (36.6 C)   Resp 16   Ht 5' 3.5" (1.613 m)   Wt 201 lb 9.6 oz (91.4 kg)   SpO2 94%   BMI 35.15 kg/m   General Appearance: Well nourished, in no acute distress though appears to be feeling unwell.  Eyes: PERRLA, EOMs, conjunctiva no swelling or erythema ENT/Mouth:  No erythema, swelling, or exudate on post pharynx.  Tonsils not swollen or erythematous. Hearing normal.  Neck: Supple, thyroid normal.   Respiratory: Respiratory effort increased, BS decreased throughout  With diffuse wheezing, without audible rales, rhonchi,  or stridor. 02 94% on RA  Cardio: Rhythm irregular, normal rate with 3/6 systolic murmur. Thready peripheral pulses upper and lower extremities without edema.  Abdomen: Soft, + BS.  Non tender, no guarding, rebound, palpable hernias or masses. Lymphatics: Non tender without lymphadenopathy.  Musculoskeletal: Full ROM, 5/5 strength, slow gait.  Skin: Warm, very dry without rashes, lesions, ecchymosis.  Psych: Awake and oriented X 3, normal affect, Insight and Judgment appropriate.     Vicie Mutters, PA-C 11:06 AM [facility]

## 2017-06-21 NOTE — Patient Instructions (Addendum)
Get on albuterol and atrovent - can combine in your nebulizer- do AT LEAST 3 x a day but can do every 4-6 hours until you are feeling better Continue the Pulmicort in the nebulizer twice a day Start on the antibiotic I'm going to check your INR and depending on your number may adjust this due to you being on an antibiotic- the antibiotic can make your blood thinner and make you bleed.   Make sure you are on mucinex AND allergy pill  Please pick one of the over the counter allergy medications below and take it once daily for allergies.  Claritin or loratadine cheapest but likely the weakest  Zyrtec or certizine at night because it can make you sleepy The strongest is allegra or fexafinadine  Cheapest at [employer], [employer], [employer]   Go to the ER if any chest pain, shortness of breath, nausea, dizziness, severe HA, changes vision/speech   Chronic Obstructive Pulmonary Disease Exacerbation Chronic obstructive pulmonary disease (COPD) is a long-term (chronic) condition that affects the lungs. COPD is a general term that can be used to describe many different lung problems that cause lung swelling (inflammation) and limit airflow, including chronic bronchitis and emphysema. COPD exacerbations are episodes when breathing symptoms become much worse and require extra treatment. COPD exacerbations are usually caused by infections. Without treatment, COPD exacerbations can be severe and even life threatening. Frequent COPD exacerbations can cause further damage to the lungs. What are the causes? This condition may be caused by:  Respiratory infections, including viral and bacterial infections.  Exposure to smoke.  Exposure to air pollution, chemical fumes, or dust.  Things that give you an allergic reaction (allergens).  Not taking your usual COPD medicines as directed.  Underlying medical problems, such as congestive heart failure or infections not involving the lungs.  In many cases, the cause  (trigger) of this condition is not known. What increases the risk? The following factors may make you more likely to develop this condition:  Smoking cigarettes.  Old age.  Frequent prior COPD exacerbations.  What are the signs or symptoms? Symptoms of this condition include:  Increased coughing.  Increased production of mucus from your lungs (sputum).  Increased wheezing.  Increased shortness of breath.  Rapid or labored breathing.  Chest tightness.  Less energy than usual.  Sleep disruption from symptoms.  Confusion or increased sleepiness.  Often these symptoms happen or get worse even with the use of medicines. How is this diagnosed? This condition is diagnosed based on:  Your medical history.  A physical exam.  You may also have tests, including:  A chest X-ray.  Blood tests.  Lung (pulmonary) function tests.  How is this treated? Treatment for this condition depends on the severity and cause of the symptoms. You may need to be admitted to a hospital for treatment. Some of the treatments commonly used to treat COPD exacerbations are:  Antibiotic medicines. These may be used for severe exacerbations caused by a lung infection, such as pneumonia.  Bronchodilators. These are inhaled medicines that expand the air passages and allow increased airflow.  Steroid medicines. These act to reduce inflammation in the airways. They may be given with an inhaler, taken by mouth, or given through an IV tube inserted into one of your veins.  Supplemental oxygen therapy.  Airway clearing techniques, such as noninvasive ventilation (NIV) and positive expiratory pressure (PEP). These provide respiratory support through a mask or other noninvasive device. An example of this would be using  a continuous positive airway pressure (CPAP) machine to improve delivery of oxygen into your lungs.  Follow these instructions at home: Medicines  Take over-the-counter and  prescription medicines only as told by your health care provider. It is important to use correct technique with inhaled medicines.  If you were prescribed an antibiotic medicine or oral steroid, take it as told by your health care provider. Do not stop taking the medicine even if you start to feel better. Lifestyle  Eat a healthy diet.  Exercise regularly.  Get plenty of sleep.  Avoid exposure to all substances that irritate the airway, especially to tobacco smoke.  Wash your hands often with soap and water to reduce the risk of infection. If soap and water are not available, use hand sanitizer.  During flu season, avoid enclosed spaces that are crowded with people. General instructions  Drink enough fluid to keep your urine clear or pale yellow (unless you have a medical condition that requires fluid restriction).  Use a cool mist vaporizer. This humidifies the air and makes it easier for you to clear your chest when you cough.  If you have a home nebulizer and oxygen, continue to use them as told by your health care provider.  Keep all follow-up visits as told by your health care provider. This is important. How is this prevented?  Stay up-to-date on pneumococcal and influenza (flu) vaccines. A flu shot is recommended every year to help prevent exacerbations.  Do not use any products that contain nicotine or tobacco, such as cigarettes and e-cigarettes. Quitting smoking is very important in preventing COPD from getting worse and in preventing exacerbations from happening as often. If you need help quitting, ask your health care provider.  Follow all instructions for pulmonary rehabilitation after a recent exacerbation. This can help prevent future exacerbations.  Work with your health care provider to develop and follow an action plan. This tells you what steps to take when you experience certain symptoms. Contact a health care provider if:  You have a worsening of your regular  COPD symptoms. Get help right away if:  You have worsening shortness of breath, even when resting.  You have trouble talking.  You have severe chest pain.  You cough up blood.  You have a fever.  You have weakness, vomit repeatedly, or faint.  You feel confused.  You are not able to sleep because of your symptoms.  You have trouble doing daily activities. Summary  COPD exacerbations are episodes when breathing symptoms become much worse and require extra treatment above your normal treatment.  Exacerbations can be severe and even life threatening. Frequent COPD exacerbations can cause further damage to your lungs.  COPD exacerbations are usually triggered by infections such as the flu, colds, and even pneumonia.  Treatment for this condition depends on the severity and cause of the symptoms. You may need to be admitted to a hospital for treatment.  Quitting smoking is very important to prevent COPD from getting worse and to prevent exacerbations from happening as often. This information is not intended to replace advice given to you by your health care provider. Make sure you discuss any questions you have with your health care provider. Document Released: 12/27/2006 Document Revised: 04/05/2016 Document Reviewed: 04/05/2016 Elsevier Interactive Patient Education  [employer].

## 2017-06-22 ENCOUNTER — Other Ambulatory Visit: Payer: Self-pay | Admitting: Physician Assistant

## 2017-06-22 ENCOUNTER — Other Ambulatory Visit: Payer: Self-pay | Admitting: Adult Health

## 2017-06-22 DIAGNOSIS — I4891 Unspecified atrial fibrillation: Secondary | ICD-10-CM | POA: Diagnosis not present

## 2017-06-22 DIAGNOSIS — N189 Chronic kidney disease, unspecified: Secondary | ICD-10-CM | POA: Diagnosis not present

## 2017-06-22 DIAGNOSIS — Z7901 Long term (current) use of anticoagulants: Secondary | ICD-10-CM | POA: Diagnosis not present

## 2017-06-22 DIAGNOSIS — I130 Hypertensive heart and chronic kidney disease with heart failure and stage 1 through stage 4 chronic kidney disease, or unspecified chronic kidney disease: Secondary | ICD-10-CM | POA: Diagnosis not present

## 2017-06-22 DIAGNOSIS — J441 Chronic obstructive pulmonary disease with (acute) exacerbation: Secondary | ICD-10-CM | POA: Diagnosis not present

## 2017-06-22 DIAGNOSIS — I509 Heart failure, unspecified: Secondary | ICD-10-CM | POA: Diagnosis not present

## 2017-06-22 LAB — CBC WITH DIFFERENTIAL/PLATELET
Basophils Absolute: 57 cells/uL (ref 0–200)
Basophils Relative: 0.5 %
EOS PCT: 0.7 %
Eosinophils Absolute: 80 cells/uL (ref 15–500)
HCT: 43.8 % (ref 35.0–45.0)
Hemoglobin: 14.7 g/dL (ref 11.7–15.5)
LYMPHS ABS: 2166 {cells}/uL (ref 850–3900)
MCH: 29.7 pg (ref 27.0–33.0)
MCHC: 33.6 g/dL (ref 32.0–36.0)
MCV: 88.5 fL (ref 80.0–100.0)
MONOS PCT: 12 %
MPV: 10.6 fL (ref 7.5–12.5)
NEUTROS PCT: 67.8 %
Neutro Abs: 7729 cells/uL (ref 1500–7800)
PLATELETS: 286 10*3/uL (ref 140–400)
RBC: 4.95 10*6/uL (ref 3.80–5.10)
RDW: 15.6 % — AB (ref 11.0–15.0)
TOTAL LYMPHOCYTE: 19 %
WBC mixed population: 1368 cells/uL — ABNORMAL HIGH (ref 200–950)
WBC: 11.4 10*3/uL — AB (ref 3.8–10.8)

## 2017-06-22 LAB — TSH: TSH: 4 mIU/L (ref 0.40–4.50)

## 2017-06-22 LAB — BASIC METABOLIC PANEL WITH GFR
BUN/Creatinine Ratio: 9 (calc) (ref 6–22)
BUN: 14 mg/dL (ref 7–25)
CHLORIDE: 98 mmol/L (ref 98–110)
CO2: 30 mmol/L (ref 20–32)
Calcium: 10 mg/dL (ref 8.6–10.4)
Creat: 1.56 mg/dL — ABNORMAL HIGH (ref 0.60–0.93)
GFR, Est African American: 37 mL/min/{1.73_m2} — ABNORMAL LOW (ref >=60)
GFR, Est Non African American: 32 mL/min/{1.73_m2} — ABNORMAL LOW (ref >=60)
GLUCOSE: 87 mg/dL (ref 65–99)
POTASSIUM: 4.7 mmol/L (ref 3.5–5.3)
SODIUM: 141 mmol/L (ref 135–146)

## 2017-06-22 LAB — PROTIME-INR
INR: 2.2 — AB
PROTHROMBIN TIME: 22.7 s — AB (ref 9.0–11.5)

## 2017-06-22 MED ORDER — SULFAMETHOXAZOLE-TRIMETHOPRIM 800-160 MG PO TABS: 1.0000 | ORAL_TABLET | Freq: Two times a day (BID) | ORAL | 0 refills | Status: DC

## 2017-06-23 ENCOUNTER — Other Ambulatory Visit (INDEPENDENT_AMBULATORY_CARE_PROVIDER_SITE_OTHER): Payer: Medicare Other

## 2017-06-23 ENCOUNTER — Ambulatory Visit: Payer: Medicare Other | Admitting: Acute Care

## 2017-06-23 ENCOUNTER — Encounter: Payer: Self-pay | Admitting: Acute Care

## 2017-06-23 VITALS — BP 110/80 | HR 70 | Ht 63.0 in | Wt 202.8 lb

## 2017-06-23 DIAGNOSIS — I4891 Unspecified atrial fibrillation: Secondary | ICD-10-CM

## 2017-06-23 DIAGNOSIS — I2721 Secondary pulmonary arterial hypertension: Secondary | ICD-10-CM | POA: Insufficient documentation

## 2017-06-23 DIAGNOSIS — R0602 Shortness of breath: Secondary | ICD-10-CM

## 2017-06-23 DIAGNOSIS — J449 Chronic obstructive pulmonary disease, unspecified: Secondary | ICD-10-CM

## 2017-06-23 DIAGNOSIS — K766 Portal hypertension: Secondary | ICD-10-CM | POA: Diagnosis not present

## 2017-06-23 DIAGNOSIS — J8410 Pulmonary fibrosis, unspecified: Secondary | ICD-10-CM

## 2017-06-23 LAB — CBC WITH DIFFERENTIAL/PLATELET
BASOS PCT: 0.1 % (ref 0.0–3.0)
Basophils Absolute: 0 10*3/uL (ref 0.0–0.1)
EOS ABS: 0 10*3/uL (ref 0.0–0.7)
Eosinophils Relative: 0 % (ref 0.0–5.0)
HCT: 44.1 % (ref 36.0–46.0)
HEMOGLOBIN: 14.7 g/dL (ref 12.0–15.0)
Lymphocytes Relative: 10.7 % — ABNORMAL LOW (ref 12.0–46.0)
Lymphs Abs: 1.6 10*3/uL (ref 0.7–4.0)
MCHC: 33.3 g/dL (ref 30.0–36.0)
MCV: 91.4 fl (ref 78.0–100.0)
MONO ABS: 1.1 10*3/uL — AB (ref 0.1–1.0)
Monocytes Relative: 7.4 % (ref 3.0–12.0)
NEUTROS PCT: 81.8 % — AB (ref 43.0–77.0)
Neutro Abs: 12.1 10*3/uL — ABNORMAL HIGH (ref 1.4–7.7)
Platelets: 328 10*3/uL (ref 150.0–400.0)
RBC: 4.83 Mil/uL (ref 3.87–5.11)
RDW: 17.5 % — ABNORMAL HIGH (ref 11.5–15.5)
WBC: 14.7 10*3/uL — AB (ref 4.0–10.5)

## 2017-06-23 LAB — BASIC METABOLIC PANEL
BUN: 24 mg/dL — ABNORMAL HIGH (ref 6–23)
CALCIUM: 9.9 mg/dL (ref 8.4–10.5)
CO2: 27 meq/L (ref 19–32)
CREATININE: 1.38 mg/dL — AB (ref 0.40–1.20)
Chloride: 99 mEq/L (ref 96–112)
GFR: 39.58 mL/min — ABNORMAL LOW (ref >60.00)
Glucose, Bld: 95 mg/dL (ref 70–99)
Potassium: 4.6 mEq/L (ref 3.5–5.1)
Sodium: 135 mEq/L (ref 135–145)

## 2017-06-23 LAB — BRAIN NATRIURETIC PEPTIDE: PRO B NATRI PEPTIDE: 208 pg/mL — AB (ref 0.0–100.0)

## 2017-06-23 MED ORDER — FLUTICASONE-UMECLIDIN-VILANT 100-62.5-25 MCG/INH IN AEPB: 1.0000 | INHALATION_SPRAY | Freq: Every day | RESPIRATORY_TRACT | 3 refills | Status: DC

## 2017-06-23 MED ORDER — LEVALBUTEROL TARTRATE 45 MCG/ACT IN AERO: 2.0000 | INHALATION_SPRAY | RESPIRATORY_TRACT | 3 refills | Status: DC | PRN

## 2017-06-23 NOTE — Assessment & Plan Note (Signed)
Worsening dyspnea progressive over time Plan We will schedule you for  PFT's We will schedule a 6 minute walk.  Referal to cardiology for a-fib, heart failure , PAH follow up HRCT chest without contrast, supine and prone to be read by Quay Burow Follow up with Dr. Melvyn Novas in 2 weeks Please contact office for sooner follow up if symptoms do not improve or worsen or seek emergency care

## 2017-06-23 NOTE — Patient Instructions (Addendum)
It is nice to meet you today. We will schedule you for  PFT's We will schedule a 6 minute walk.  Mucinex 1200 mg once daily with a full glass of water. Stop Pulmicort  twice daily Stop Bevespi as you a have been doing. Start Trelegy 1 puff once daily. Rinse mouth after use. We will prescribe Xopenex rescue inhaler Finish antibiotic given by PCP. Labs today, CBC, BNP, BMET.>> if BNP is elevated we will consider an echo. Continue Allergy medication daily. Referal to cardiology for a-fib, heart failure , PAH follow up HRCT chest without contrast, supine and prone to be read by Quay Burow Follow up with Dr. Melvyn Novas in 2 weeks Please contact office for sooner follow up if symptoms do not improve or worsen or seek emergency care  Consider sleep study at follow-up to ensure OSA is not component of possible PAH or contributing to worsening heart failure

## 2017-06-23 NOTE — Assessment & Plan Note (Addendum)
Worsening dyspnea Most recent pulmonary function test 03/03/2016 which showed mild obstructive airways disease, minimal restriction, and moderately severe diffusion deficit Patient is currently on Pulmicort as maintenance She is overusing her as needed albuterol Plan We will schedule you for  PFT's We will schedule a 6 minute walk.  Mucinex 1200 mg once daily with a full glass of water. Stop Pulmicort  twice daily Stop Bevespi as you a have been doing. Start Trelegy 1 puff once daily. Rinse mouth after use. We will prescribe Xopenex rescue inhaler Finish antibiotic given by PCP. Labs today, CBC, BNP, BMET.>> if BNP is elevated we will consider an echo. Continue Allergy medication daily. Referal to cardiology for a-fib, heart failure , PAH follow up HRCT chest without contrast, supine and prone to be read by Katie [name] Follow up with Katie [name] in 2 weeks Please contact office for sooner follow up if symptoms do not improve or worsen or seek emergency care

## 2017-06-23 NOTE — Assessment & Plan Note (Addendum)
Concern for possible PAH Last echo done 05/08/2017 while in the hospital indicated PA pressure 66 mmHg with a moderately dilated atrium>> she was septic at the time so question what pressures may be in stable state Limited use of Lasix due to renal function Plan Check BNP Refer back to cardiology for follow-up of potential cardiac contributor to dyspnea Consider repeat echo Will need sleep study at follow-up to ensure OSA is not a component

## 2017-06-23 NOTE — Progress Notes (Signed)
History of Present Illness Katie [name] is a 77 y.o. female former smoker, quit 2004, with A. Fib ( On coumadin), CHF, hypertension, CKD, COPD, hypothyroidism. ? Pulmonary Fibrosis per Dr. Lovena Le 01/2017 note. She is followed by Dr. Melvyn Novas  06/23/2017 Acute OV: Pt. Presents for an acute OV. She was last seen in the office 02/2016. She states she continues to have shortness of breath. She has been hospitalized for pneumonia in 2018 and again in 04/2016 when she has sepsis with the pneumonia.She was treated with IV antibiotics ( Vanc Zosyn) , IV steroids, Scheduled BD's, Oxygen. She did require vasopressor support.She was discharged home on prednisone, and instructions to take pulmicort nebs 2 times daily, with albuterol nebs as rescue.She is also using her ProAir 1 puff twice daily.  She is very shaky today in the office.  Her daughter thinks it is the prednisone, but I suspect she is over using her albuterol, in addition to the recent prednisone injection She states she went to her PCP 06/21/2017 for dyspnea. She was diagnosed with a COPD exacerbation and she was treated with prednisone injection and antibiotic ( Septra DS). She presents today as the 35 assistant at her PCP recommended she seen by  Pulmonary.she states that this worsening dyspnea has been ongoing since February when she had her most recent bout of pneumonia.  She states she is currently having yellow secretions. She does get short of breath with activity.She states she is compliant with her Bevespi and Duonebs. She is not taking anything for allergies at present.  She states she is currently compliant with the antibiotic prescribed 2 days ago at her PCP.  She states she takes Lasix as needed for fluid retention.  Of note patient is a very poor historian, she appears very confused about which medication she is taking.  Her daughter is here with her today and shares in the patient's frustration regarding appropriate medication therapy  for her chronic illnesses.  Test Results: HRCT Chest without contrast>. Pending  CBC    Component Value Date/Time   WBC 14.7 (H) 06/23/2017 1231   RBC 4.83 06/23/2017 1231   HGB 14.7 06/23/2017 1231   HCT 44.1 06/23/2017 1231   HCT 48.5 (H) 07/07/2016 1941   PLT 328.0 06/23/2017 1231   MCV 91.4 06/23/2017 1231   MCH 29.7 06/21/2017 1116   MCHC 33.3 06/23/2017 1231   RDW 17.5 (H) 06/23/2017 1231   LYMPHSABS 1.6 06/23/2017 1231   MONOABS 1.1 (H) 06/23/2017 1231   EOSABS 0.0 06/23/2017 1231   BASOSABS 0.0 06/23/2017 1231   BMET    Component Value Date/Time   NA 135 06/23/2017 1231   NA 144 07/22/2016   K 4.6 06/23/2017 1231   CL 99 06/23/2017 1231   CO2 27 06/23/2017 1231   GLUCOSE 95 06/23/2017 1231   BUN 24 (H) 06/23/2017 1231   BUN 20 07/22/2016   CREATININE 1.38 (H) 06/23/2017 1231   CREATININE 1.56 (H) 06/21/2017 1116   CALCIUM 9.9 06/23/2017 1231   GFRNONAA 32 (L) 06/21/2017 1116   GFRAA 37 (L) 06/21/2017 1116   BNP    Component Value Date/Time   BNP 60.2 07/07/2016 1512   BNP 57.8 06/23/2016 1144    ProBNP    Component Value Date/Time   PROBNP 208.0 (H) 06/23/2017 1231     05/08/2017 Echo>> Study Conclusions  - Left ventricle: The cavity size was normal. Wall thickness was   increased in a pattern of moderate LVH. The estimated  ejection   fraction was 45%. Diffuse hypokinesis. The study was not   technically sufficient to allow evaluation of LV diastolic   dysfunction due to atrial fibrillation. - Ventricular septum: Septal motion showed abnormal function and   dyssynergy. These changes are consistent with a left bundle   branch block. - Aortic valve: Moderately calcified annulus. Trileaflet; mildly   calcified leaflets. There was mild stenosis. There was mild   regurgitation. Peak velocity (S): 251 cm/s. Mean gradient (S): 14   mm Hg. Valve area (VTI): 1.95 cm^2. - Aorta: Mild aortic root dilatation. - Mitral valve: Calcified annulus. There  was mild to moderate   regurgitation. Valve area by continuity equation (using LVOT   flow): 3.32 cm^2. - Right ventricle: Systolic function was mildly reduced. - Right atrium: The atrium was moderately dilated. - Tricuspid valve: There was mild-moderate regurgitation. - Pulmonary arteries: PA peak pressure: 66 mm Hg (S). - Inferior vena cava: The vessel was dilated. The respirophasic   diameter changes were blunted (< 50%), consistent with elevated   central venous pressure. CVP 15 mmHg.  ------------------------------------------------------------------- 06/02/2016 No pulmonary embolus. Enlarged main pulmonary artery and reflux of contrast into the IVC and hepatic veins, which may be seen in the setting of pulmonary hypertension. Cardiomegaly and aortic atherosclerosis. Large posterior osteophyte at the T5-T6 level with at least mild narrowing of the spinal canal. Multifocal peripheral reticular pulmonary opacities, improved compared to 12/25/2015, possibly indicating underlying interstitial lung disease.  PFT's 02/2016>> Pulmonary Function Diagnosis: Mild Obstructive Airways Disease Minimal Restriction -Parenchymal Moderately severe Diffusion Defect    CBC Latest Ref Rng & Units 06/23/2017 06/21/2017 06/01/2017  WBC 4.0 - 10.5 K/uL 14.7(H) 11.4(H) -  Hemoglobin 12.0 - 15.0 g/dL 14.7 14.7 16.7(H)  Hematocrit 36.0 - 46.0 % 44.1 43.8 49.0(H)  Platelets 150.0 - 400.0 K/uL 328.0 286 -    BMP Latest Ref Rng & Units 06/23/2017 06/21/2017 06/09/2017  Glucose 70 - 99 mg/dL 95 87 83  BUN 6 - 23 mg/dL 24(H) 14 16  Creatinine 0.40 - 1.20 mg/dL 1.38(H) 1.56(H) 1.12(H)  BUN/Creat Ratio 6 - 22 (calc) - 9 14  Sodium 135 - 145 mEq/L 135 141 141  Potassium 3.5 - 5.1 mEq/L 4.6 4.7 4.4  Chloride 96 - 112 mEq/L 99 98 102  CO2 19 - 32 mEq/L 27 30 28   Calcium 8.4 - 10.5 mg/dL 9.9 10.0 10.0    BNP    Component Value Date/Time   BNP 60.2 07/07/2016 1512   BNP 57.8 06/23/2016 1144     ProBNP    Component Value Date/Time   PROBNP 208.0 (H) 06/23/2017 1231    PFT    Component Value Date/Time   FEV1PRE 1.51 02/16/2016 1153   FEV1POST 1.52 02/16/2016 1153   FVCPRE 2.06 02/16/2016 1153   FVCPOST 2.05 02/16/2016 1153   TLC 3.86 02/16/2016 1153   DLCOUNC 12.64 02/16/2016 1153   PREFEV1FVCRT 73 02/16/2016 1153   PSTFEV1FVCRT 74 02/16/2016 1153    Dg Chest 2 View  Result Date: 06/01/2017 CLINICAL DATA:  Weakness. EXAM: CHEST - 2 VIEW COMPARISON:  One-view chest x-ray 05/07/2017 FINDINGS: The heart is enlarged. Atherosclerotic changes are again noted at the aortic arch. Chronic interstitial coarsening is present without significant edema or effusion. No focal airspace consolidation is present. Stent graft is noted within the abdominal aorta. The visualized soft tissues and bony thorax are otherwise unremarkable. IMPRESSION: 1. Cardiomegaly without failure. 2. Stable chronic interstitial disease. 3. No acute cardiopulmonary disease. Electronically Signed  By: San Morelle M.D.   On: 06/01/2017 16:51     Past medical hx Past Medical History:  Diagnosis Date  . Atrial fibrillation ([place])   . Migraine headache   . Osteopenia      Social History   Tobacco Use  . Smoking status: Former Smoker    Types: Cigarettes    Last attempt to quit: 08/10/2002    Years since quitting: 14.8  . Smokeless tobacco: Never Used  . Tobacco comment: History of tobacco abuse  Substance Use Topics  . Alcohol use: Yes    Alcohol/week: 0.0 oz    Comment: rare  . Drug use: No    Ms.Hirschfeld reports that she quit smoking about 14 years ago. Her smoking use included cigarettes. She has never used smokeless tobacco. She reports that she drinks alcohol. She reports that she does not use drugs.  Tobacco Cessation: Former smoker quit 2004 Past surgical hx, Family hx, Social hx all reviewed.  Current Outpatient Medications on File Prior to Visit  Medication Sig  . acetaminophen  (TYLENOL) 500 MG tablet Take 1,000 mg by mouth daily.   Marland Kitchen allopurinol (ZYLOPRIM) 300 MG tablet Take 300 mg by mouth daily.  Marland Kitchen aspirin EC 81 MG tablet Take 81 mg by mouth daily.  . benzonatate (TESSALON) 200 MG capsule Take 1 perle 3 x / day to prevent cough (Patient taking differently: as needed. Take 1 perle 3 x / day to prevent cough)  . Cholecalciferol (VITAMIN D3) 5000 units CAPS Takes 2 caps (10,000 units) daily (Patient taking differently: Take 5,000 Units by mouth daily. )  . digoxin (LANOXIN) 0.125 MG tablet Take 1 tablet daily for Heart Rhythm (Patient taking differently: Take 0.125 mg by mouth daily. Take 1 tablet daily for Heart Rhythm)  . diltiazem (TIAZAC) 120 MG 24 hr capsule TAKE 1 CAPSULE BY MOUTH EVERY DAY  . furosemide (LASIX) 80 MG tablet TAKE 1 TABLET BY MOUTH TWICE A DAY AS NEEDED FOR FLUID (Patient taking differently: Take 80 mg by mouth daily. TAKE 1 TABLET BY MOUTH TWICE A DAY AS NEEDED FOR FLUI)  . gabapentin (NEURONTIN) 600 MG tablet Takes 1/2 to 1 tablet 3 times daily.  Marland Kitchen guaiFENesin-dextromethorphan (ROBITUSSIN DM) 100-10 MG/5ML syrup Take 10 mLs by mouth every 4 (four) hours as needed for cough.  Marland Kitchen ipratropium (ATROVENT) 0.02 % nebulizer solution Take 2.5 mLs (0.5 mg total) by nebulization every 4 (four) hours as needed for wheezing or shortness of breath.  . Lactobacillus (PROBIOTIC ACIDOPHILUS PO) Take by mouth.  . levothyroxine (SYNTHROID, LEVOTHROID) 50 MCG tablet TAKE 1 TABLET BY MOUTH EVERY DAY (Patient taking differently: Take 1.5 tablets on all days)  . mirabegron ER (MYRBETRIQ) 25 MG TB24 tablet Take 1 tablet (25 mg total) by mouth daily. (Patient taking differently: Take 25 mg by mouth as needed. )  . montelukast (SINGULAIR) 10 MG tablet TAKE 1 TABLET EVERY DAY FOR ALLERGIES  . Olopatadine HCl 0.2 % SOLN Place 1 drop into both eyes daily.   . pantoprazole (PROTONIX) 40 MG tablet Take 1 tablet (40 mg total) by mouth daily.  [name] Glycol-Propyl Glycol  (SYSTANE OP) Apply 1 drop to eye 4 (four) times daily.   . potassium chloride SA (KLOR-CON M20) 20 MEQ tablet TAKE 2 TABLETS (40 MEQ TOTAL) BY MOUTH DAILY. (Patient taking differently: Take 40 mEq by mouth daily. )  . pravastatin (PRAVACHOL) 40 MG tablet TAKE 1 TABLET BY MOUTH AT BEDTIME FOR CHOLESTEROL  . prednisoLONE acetate (  PRED FORTE) 1 % ophthalmic suspension INSTILL 1 DROP INTO BOTH EYES FOUR TIMES A DAY FOR 10 DAYS AND THEN TWICE DAY.  Marland Kitchen prochlorperazine (COMPAZINE) 5 MG tablet TAKE 1 TABLET BY MOUTH THREE TIMES A DAY AS NEEDED FOR VERTIGO OR NAUSEA  . sulfamethoxazole-trimethoprim (BACTRIM DS,SEPTRA DS) 800-160 MG tablet Take 1 tablet by mouth 2 (two) times daily.  Marland Kitchen triamcinolone cream (KENALOG) 0.1 % Apply 1 application topically at bedtime.  Marland Kitchen warfarin (COUMADIN) 3 MG tablet TAKE 1-2 TABLETS DAILY AS DIRECTED (Patient taking differently: 2 mg. TAKE 1-2 TABLETS DAILY AS DIRECTED)   No current facility-administered medications on file prior to visit.      Allergies  Allergen Reactions  . Amiodarone Other (See Comments)    PULMONARY TOXICITY  . Diovan [Valsartan] Other (See Comments)    HYPOTENSION  . Doxycycline Diarrhea and Other (See Comments)    VISUAL DISTURBANCE  . Flexeril [Cyclobenzaprine] Other (See Comments)    FATIGUE  . Keflex [Cephalexin] Diarrhea  . Verapamil Other (See Comments)    EDEMA  . Codeine Other (See Comments)    unknown    Review Of Systems:  Constitutional:   No  weight loss, night sweats,  Fevers, chills,+ fatigue, or  lassitude.  HEENT:   No headaches,  Difficulty swallowing,  Tooth/dental problems, or  Sore throat,                No sneezing, itching, ear ache, nasal congestion, post nasal drip,   CV:  No chest pain,  Orthopnea, PND, + swelling in lower extremities, anasarca, dizziness, palpitations, syncope.   GI  No heartburn, indigestion, abdominal pain, nausea, vomiting, diarrhea, change in bowel habits, loss of appetite, bloody stools.     Resp: + shortness of breath with exertion or at rest.  + excess mucus, + productive cough,  No non-productive cough,  No coughing up of blood.  + change in color of mucus.  No wheezing.  No chest wall deformity  Skin: no rash or lesions.  GU: no dysuria, change in color of urine, no urgency or frequency.  No flank pain, no hematuria   MS:  No joint pain or swelling.  No decreased range of motion.  No back pain.  Psych:  No change in mood or affect. No depression or anxiety.  No memory loss.   Vital Signs BP 110/80 (BP Location: Left Arm, Cuff Size: Normal)   Pulse 70   Ht 5\' 3"  (1.6 m)   Wt 202 lb 12.8 oz (92 kg)   SpO2 97%   BMI 35.92 kg/m    Physical Exam:  General- No distress,  A&Ox3, pleasant, anxious ENT: No sinus tenderness, TM clear, pale nasal mucosa, no oral exudate,no post nasal drip, no LAN Cardiac: S1, S2, regular rate and rhythm, no murmur Chest: No wheeze/ rales/ dullness; no accessory muscle use, no nasal flaring, no sternal retractions Abd.: Soft Non-tender, nondistended, bowel sounds positive, obese Ext: No clubbing cyanosis, trace edema Neuro:  normal strength, deconditioned at baseline, moving all extremities x4, alert and oriented x3 Skin: No rashes, warm and dry Psych: Highly anxious, and confused about her medical regimen   Assessment/Plan  COPD (chronic obstructive pulmonary disease) with chronic bronchitis (HCC) Worsening dyspnea Most recent pulmonary function test 03/03/2016 which showed mild obstructive airways disease, minimal restriction, and moderately severe diffusion deficit Patient is currently on Pulmicort as maintenance She is overusing her as needed albuterol Plan We will schedule you for  PFT's We will  schedule a 6 minute walk.  Mucinex 1200 mg once daily with a full glass of water. Stop Pulmicort  twice daily Stop Bevespi as you a have been doing. Start Trelegy 1 puff once daily. Rinse mouth after use. We will prescribe  Xopenex rescue inhaler Finish antibiotic given by PCP. Labs today, CBC, BNP, BMET.>> if BNP is elevated we will consider an echo. Continue Allergy medication daily. Referal to cardiology for a-fib, heart failure , PAH follow up HRCT chest without contrast, supine and prone to be read by Quay Burow Follow up with Dr. Melvyn Novas in 2 weeks Please contact office for sooner follow up if symptoms do not improve or worsen or seek emergency care    Pulmonary Fibrosis sequellae of Amiodarone Worsening dyspnea progressive over time Plan We will schedule you for  PFT's We will schedule a 6 minute walk.  Referal to cardiology for a-fib, heart failure , PAH follow up HRCT chest without contrast, supine and prone to be read by Quay Burow Follow up with Dr. Melvyn Novas in 2 weeks Please contact office for sooner follow up if symptoms do not improve or worsen or seek emergency care    Atrial fibrillation ([place]) Rate controlled on Coumadin Continue Coumadin and other therapy per cards Referral to cardiology for follow-up as patient states she has not been in a while  [place] (pulmonary artery hypertension) ([place]) Concern for possible PAH Last echo done 05/08/2017 while in the hospital indicated PA pressure 66 mmHg with a moderately dilated atrium>> she was septic at the time so question what pressures may be in stable state Limited use of Lasix due to renal function Plan Check BNP Refer back to cardiology for follow-up of potential cardiac contributor to dyspnea Consider repeat echo Will need sleep study at follow-up to ensure OSA is not a component       Magdalen Spatz, NP 06/23/2017  4:00 PM

## 2017-06-23 NOTE — Assessment & Plan Note (Signed)
Rate controlled on Coumadin Continue Coumadin and other therapy per cards Referral to cardiology for follow-up as patient states she has not been in a while

## 2017-06-24 ENCOUNTER — Ambulatory Visit: Payer: Self-pay | Admitting: Internal Medicine

## 2017-06-27 ENCOUNTER — Ambulatory Visit (INDEPENDENT_AMBULATORY_CARE_PROVIDER_SITE_OTHER): Payer: Medicare Other | Admitting: *Deleted

## 2017-06-27 DIAGNOSIS — J449 Chronic obstructive pulmonary disease, unspecified: Secondary | ICD-10-CM | POA: Diagnosis not present

## 2017-06-27 DIAGNOSIS — I2721 Secondary pulmonary arterial hypertension: Secondary | ICD-10-CM | POA: Diagnosis not present

## 2017-06-27 NOTE — Progress Notes (Signed)
SIX MIN WALK 06/27/2017  Medications Zyloprim 300mg , Aspirin 81mg , Vitamin D3 5000units, Lanoxin 0.125mg , Tiazac 120mg , Neurotin 600mg , Mucinex DM, Protonix 40mg , Olopatadine HCl 0.2%, Polyethyl Glycol-Propyl Glycol eye drops, Potassium Chloride 20MEq, Bactrim 800-160mg , Coumadin 3mg  all taken at 8:00am  Supplimental Oxygen during Test? (L/min) No  Laps 4  Partial Lap (in Meters) 30  Baseline BP (sitting) 114/68  Baseline Heartrate 101  Baseline Dyspnea (Borg Scale) 3  Baseline Fatigue (Borg Scale) 0  Baseline SPO2 96  BP (sitting) 122/80  Heartrate 116  Dyspnea (Borg Scale) 3  Fatigue (Borg Scale) 0.5  SPO2 91  BP (sitting) 120/72  Heartrate 100  SPO2 95  Stopped or Paused before Six Minutes No  Interpretation (No Data)  Distance Completed 222  Tech Comments: Pt walked at a slow to normal pace having complaints of knee pain after hitting it at home and pt was doing a lot of mouth breathing during walk

## 2017-06-28 ENCOUNTER — Other Ambulatory Visit: Payer: Self-pay

## 2017-06-28 DIAGNOSIS — R0602 Shortness of breath: Secondary | ICD-10-CM

## 2017-06-28 NOTE — Progress Notes (Signed)
Order xray per SG

## 2017-06-29 DIAGNOSIS — Z7901 Long term (current) use of anticoagulants: Secondary | ICD-10-CM | POA: Diagnosis not present

## 2017-06-29 DIAGNOSIS — I509 Heart failure, unspecified: Secondary | ICD-10-CM | POA: Diagnosis not present

## 2017-06-29 DIAGNOSIS — I4891 Unspecified atrial fibrillation: Secondary | ICD-10-CM | POA: Diagnosis not present

## 2017-06-29 DIAGNOSIS — J441 Chronic obstructive pulmonary disease with (acute) exacerbation: Secondary | ICD-10-CM | POA: Diagnosis not present

## 2017-06-29 DIAGNOSIS — I130 Hypertensive heart and chronic kidney disease with heart failure and stage 1 through stage 4 chronic kidney disease, or unspecified chronic kidney disease: Secondary | ICD-10-CM | POA: Diagnosis not present

## 2017-06-29 DIAGNOSIS — N189 Chronic kidney disease, unspecified: Secondary | ICD-10-CM | POA: Diagnosis not present

## 2017-07-02 ENCOUNTER — Other Ambulatory Visit: Payer: Self-pay | Admitting: Internal Medicine

## 2017-07-04 ENCOUNTER — Ambulatory Visit (INDEPENDENT_AMBULATORY_CARE_PROVIDER_SITE_OTHER): Payer: Medicare Other | Admitting: Internal Medicine

## 2017-07-04 DIAGNOSIS — R0602 Shortness of breath: Secondary | ICD-10-CM | POA: Diagnosis not present

## 2017-07-04 LAB — PULMONARY FUNCTION TEST
DL/VA % PRED: 62 %
DL/VA: 3 ml/min/mmHg/L
DLCO unc % pred: 44 %
DLCO unc: 10.76 ml/min/mmHg
FEF 25-75 POST: 1.24 L/s
FEF 25-75 Pre: 1.53 L/sec
FEF2575-%CHANGE-POST: -19 %
FEF2575-%PRED-PRE: 93 %
FEF2575-%Pred-Post: 75 %
FEV1-%Change-Post: -4 %
FEV1-%PRED-POST: 73 %
FEV1-%Pred-Pre: 76 %
FEV1-Post: 1.54 L
FEV1-Pre: 1.61 L
FEV1FVC-%CHANGE-POST: -3 %
FEV1FVC-%Pred-Pre: 106 %
FEV6-%CHANGE-POST: -1 %
FEV6-%Pred-Post: 74 %
FEV6-%Pred-Pre: 75 %
FEV6-PRE: 2.02 L
FEV6-Post: 1.99 L
FEV6FVC-%PRED-PRE: 105 %
FEV6FVC-%Pred-Post: 105 %
FVC-%Change-Post: -1 %
FVC-%Pred-Post: 71 %
FVC-%Pred-Pre: 71 %
FVC-Post: 1.99 L
FVC-Pre: 2.02 L
POST FEV1/FVC RATIO: 77 %
POST FEV6/FVC RATIO: 100 %
PRE FEV1/FVC RATIO: 80 %
Pre FEV6/FVC Ratio: 100 %
RV % pred: 55 %
RV: 1.28 L
TLC % PRED: 70 %
TLC: 3.55 L

## 2017-07-04 NOTE — Progress Notes (Signed)
PFT completed 07/04/17

## 2017-07-05 ENCOUNTER — Ambulatory Visit (INDEPENDENT_AMBULATORY_CARE_PROVIDER_SITE_OTHER): Payer: Medicare Other | Admitting: Internal Medicine

## 2017-07-05 ENCOUNTER — Ambulatory Visit (INDEPENDENT_AMBULATORY_CARE_PROVIDER_SITE_OTHER)
Admission: RE | Admit: 2017-07-05 | Discharge: 2017-07-05 | Disposition: A | Discharge status: Home / Self Care | Payer: Medicare Other | Source: Ambulatory Visit | Attending: Acute Care | Admitting: Acute Care

## 2017-07-05 ENCOUNTER — Encounter: Payer: Self-pay | Admitting: Internal Medicine

## 2017-07-05 VITALS — BP 114/82 | HR 84 | Temp 97.5°F | Resp 18 | Ht 63.0 in | Wt 205.8 lb

## 2017-07-05 DIAGNOSIS — E782 Mixed hyperlipidemia: Secondary | ICD-10-CM

## 2017-07-05 DIAGNOSIS — I10 Essential (primary) hypertension: Secondary | ICD-10-CM | POA: Diagnosis not present

## 2017-07-05 DIAGNOSIS — M109 Gout, unspecified: Secondary | ICD-10-CM | POA: Diagnosis not present

## 2017-07-05 DIAGNOSIS — J310 Chronic rhinitis: Secondary | ICD-10-CM

## 2017-07-05 DIAGNOSIS — Z7901 Long term (current) use of anticoagulants: Secondary | ICD-10-CM | POA: Diagnosis not present

## 2017-07-05 DIAGNOSIS — E039 Hypothyroidism, unspecified: Secondary | ICD-10-CM | POA: Diagnosis not present

## 2017-07-05 DIAGNOSIS — I482 Chronic atrial fibrillation, unspecified: Secondary | ICD-10-CM

## 2017-07-05 DIAGNOSIS — Z79899 Other long term (current) drug therapy: Secondary | ICD-10-CM

## 2017-07-05 DIAGNOSIS — R7309 Other abnormal glucose: Secondary | ICD-10-CM

## 2017-07-05 DIAGNOSIS — E559 Vitamin D deficiency, unspecified: Secondary | ICD-10-CM

## 2017-07-05 DIAGNOSIS — R0602 Shortness of breath: Secondary | ICD-10-CM | POA: Diagnosis not present

## 2017-07-05 DIAGNOSIS — J849 Interstitial pulmonary disease, unspecified: Secondary | ICD-10-CM | POA: Diagnosis not present

## 2017-07-05 MED ORDER — IPRATROPIUM BROMIDE 0.06 % NA SOLN: NASAL | 3 refills | Status: DC

## 2017-07-05 NOTE — Patient Instructions (Signed)

## 2017-07-05 NOTE — Progress Notes (Signed)
This very nice 77 y.o. MWF presents for 3 month follow up with HTN, HLD, Pre-Diabetes and Vitamin D Deficiency. Patint has hx/o Gout controlled on Allopurinol.      Patient is treated for HTN (1999) and CKD3.  Patient has hx/o Chronic Afib since 1999 & has been on Coumadin since. 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 In 2014, Lexiscan showed EF 50%. mShe had an endovascular graft placed in 2015. Today's BP is at goal -114/82. Patient has had no complaints of any cardiac type chest pain, palpitations, dyspnea / orthopnea / PND, dizziness, claudication, or dependent edema.     Hyperlipidemia is not controlled with diet & meds. Patient denies myalgias or other med SE's. Last Lipids were not at goal: Lab Results  Component Value Date   CHOL 185 04/22/2017   HDL 44 (L) 04/22/2017   LDLCALC 110 (H) 04/22/2017   TRIG 195 (H) 04/22/2017   CHOLHDL 4.2 04/22/2017      Also, the patient has history of Morbid Obesity (BMI 36+) and PreDiabetes (A1c 5.7%/2013) and has had no symptoms of reactive hypoglycemia, diabetic polys, paresthesias or visual blurring.  Last A1c was not at goal: Lab Results  Component Value Date   HGBA1C 5.8 (H) 04/22/2017      Patient has been on Thyroid Replacement since 2012. Further, the patient also has history of Vitamin D Deficiency ("33"/2008)  and supplements vitamin D without any suspected side-effects. Last vitamin D was at goal: Lab Results  Component Value Date   VD25OH 18 04/22/2017   Current Outpatient Medications on File Prior to Visit  Medication Sig  . acetaminophen (TYLENOL) 500 MG tablet Take 1,000 mg by mouth daily.   Marland Kitchen allopurinol (ZYLOPRIM) 300 MG tablet Take 300 mg by mouth daily.  Marland Kitchen aspirin EC 81 MG tablet Take 81 mg by mouth daily.  . benzonatate (TESSALON) 200 MG capsule Take 1 perle 3 x / day to prevent cough (Patient taking differently: as needed. Take 1 perle 3 x / day to  prevent cough)  . Cholecalciferol (VITAMIN D3) 5000 units CAPS Takes 2 caps (10,000 units) daily (Patient taking differently: Take 5,000 Units by mouth daily. )  . digoxin (LANOXIN) 0.125 MG tablet Take 1 tablet daily for Heart Rhythm (Patient taking differently: Take 0.125 mg by mouth daily. Take 1 tablet daily for Heart Rhythm)  . diltiazem (TIAZAC) 120 MG 24 hr capsule TAKE 1 CAPSULE BY MOUTH EVERY DAY  . Fluticasone-Umeclidin-Vilant (TRELEGY ELLIPTA) 100-62.5-25 MCG/INH AEPB Inhale 1 puff into the lungs daily.  . furosemide (LASIX) 80 MG tablet TAKE 1 TABLET BY MOUTH TWICE A DAY AS NEEDED FOR FLUID (Patient taking differently: Take 80 mg by mouth daily. TAKE 1 TABLET BY MOUTH TWICE A DAY AS NEEDED FOR FLUI)  . gabapentin (NEURONTIN) 600 MG tablet Takes 1/2 to 1 tablet 3 times daily.  Marland Kitchen guaiFENesin (MUCINEX) 600 MG 12 hr tablet Take 600 mg by mouth 2 (two) times daily.  Marland Kitchen ipratropium (ATROVENT) 0.02 % nebulizer solution Take 2.5 mLs (0.5 mg total) by nebulization every 4 (four) hours as needed for wheezing or shortness of breath.  . Lactobacillus (PROBIOTIC ACIDOPHILUS PO) Take by mouth.  . levalbuterol (XOPENEX HFA) 45 MCG/ACT inhaler Inhale 2 puffs into the lungs every 4 (four) hours as needed for wheezing.  Marland Kitchen levothyroxine (SYNTHROID, LEVOTHROID) 50 MCG tablet TAKE 1 TABLET BY MOUTH EVERY DAY (Patient taking differently: Take 1.5 tablets  on all days)  . mirabegron ER (MYRBETRIQ) 25 MG TB24 tablet Take 1 tablet (25 mg total) by mouth daily. (Patient taking differently: Take 25 mg by mouth as needed. )  . montelukast (SINGULAIR) 10 MG tablet TAKE 1 TABLET EVERY DAY FOR ALLERGIES  . Olopatadine HCl 0.2 % SOLN Place 1 drop into both eyes daily.   . pantoprazole (PROTONIX) 40 MG tablet Take 1 tablet (40 mg total) by mouth daily.  [name] Glycol-Propyl Glycol (SYSTANE OP) Apply 1 drop to eye 4 (four) times daily.   . potassium chloride SA (KLOR-CON M20) 20 MEQ tablet TAKE 2 TABLETS (40 MEQ  TOTAL) BY MOUTH DAILY. (Patient taking differently: Take 40 mEq by mouth daily. )  . pravastatin (PRAVACHOL) 40 MG tablet TAKE 1/2 TABLET BY MOUTH AT BEDTIME FOR CHOLESTEROL  . prednisoLONE acetate (PRED FORTE) 1 % ophthalmic suspension INSTILL 1 DROP INTO BOTH EYES FOUR TIMES A DAY FOR 10 DAYS AND THEN TWICE DAY.  Marland Kitchen prochlorperazine (COMPAZINE) 5 MG tablet TAKE 1 TABLET BY MOUTH THREE TIMES A DAY AS NEEDED FOR VERTIGO OR NAUSEA  . triamcinolone cream (KENALOG) 0.1 % Apply 1 application topically at bedtime.  Marland Kitchen warfarin (COUMADIN) 3 MG tablet TAKE 1-2 TABLETS DAILY AS DIRECTED (Patient taking differently: 2 mg. TAKE 1-2 TABLETS DAILY AS DIRECTED)   No current facility-administered medications on file prior to visit.    Allergies  Allergen Reactions  . Amiodarone Other (See Comments)    PULMONARY TOXICITY  . Diovan [Valsartan] Other (See Comments)    HYPOTENSION  . Doxycycline Diarrhea and Other (See Comments)    VISUAL DISTURBANCE  . Flexeril [Cyclobenzaprine] Other (See Comments)    FATIGUE  . Keflex [Cephalexin] Diarrhea  . Verapamil Other (See Comments)    EDEMA  . Codeine Other (See Comments)    unknown   PMHx:   Past Medical History:  Diagnosis Date  . Atrial fibrillation ([place])   . Migraine headache   . Osteopenia    Immunization History  Administered Date(s) Administered  . Influenza, High Dose Seasonal PF 12/20/2013, 12/09/2014, 11/14/2015, 01/10/2017  . Influenza-Unspecified 01/02/2013  . PPD Test 07/17/2016  . Pneumococcal Conjugate-13 01/23/2014  . Pneumococcal-Unspecified 03/15/1993, 05/31/2008  . Td 03/16/2007  . Varicella 02/19/2008   Past Surgical History:  Procedure Laterality Date  . ABDOMINAL AORTIC ENDOVASCULAR STENT GRAFT N/A 04/11/2013   Procedure: ABDOMINAL AORTIC ENDOVASCULAR STENT GRAFT WITH RIGHT FEMORAL PATCH ANGIOPLASTY;  Surgeon: Mal Misty, MD;  Location: [facility];  Service: Vascular;  Laterality: N/A;  . BREAST SURGERY     LEFT BREAST BIOPSY   . broken leg Left 1970s  . CARDIAC CATHETERIZATION    . CHOLECYSTECTOMY  1972  . COLONOSCOPY    . EYE SURGERY Bilateral   . Laser vein procedure    . REFRACTIVE SURGERY    . TONSILLECTOMY     FHx:    Reviewed / unchanged  SHx:    Reviewed / unchanged  Systems Review:  Constitutional: Denies fever, chills, wt changes, headaches, insomnia, fatigue, night sweats, change in appetite. Eyes: Denies redness, blurred vision, diplopia, discharge, itchy, watery eyes.  ENT: Denies discharge, congestion, post nasal drip, epistaxis, sore throat, earache, hearing loss, dental pain, tinnitus, vertigo, sinus pain, snoring.  CV: Denies chest pain, palpitations, irregular heartbeat, syncope, dyspnea, diaphoresis, orthopnea, PND, claudication or edema. Respiratory: denies cough, dyspnea, DOE, pleurisy, hoarseness, laryngitis, wheezing.  Gastrointestinal: Denies dysphagia, odynophagia, heartburn, reflux, water brash, abdominal pain or cramps, nausea, vomiting, bloating, diarrhea,  constipation, hematemesis, melena, hematochezia  or hemorrhoids. Genitourinary: Denies dysuria, frequency, urgency, nocturia, hesitancy, discharge, hematuria or flank pain. Musculoskeletal: Denies arthralgias, myalgias, stiffness, jt. swelling, pain, limping or strain/sprain.  Skin: Denies pruritus, rash, hives, warts, acne, eczema or change in skin lesion(s). Neuro: No weakness, tremor, incoordination, spasms, paresthesia or pain. Psychiatric: Denies confusion, memory loss or sensory loss. Endo: Denies change in weight, skin or hair change.  Heme/Lymph: No excessive bleeding, bruising or enlarged lymph nodes.  Physical Exam  BP 114/82   Pulse 84   Temp (!) 97.5 F (36.4 C)   Resp 18   Ht 5\' 3"  (1.6 m)   Wt 205 lb 12.8 oz (93.4 kg)   BMI 36.46 kg/m   Appears over nourished  and in no distress.  Eyes: PERRLA, EOMs, conjunctiva no swelling or erythema. Sinuses: No frontal/maxillary tenderness ENT/Mouth: EAC's clear,  TM's nl w/o erythema, bulging. Nares clear w/o erythema, swelling, exudates. Oropharynx clear without erythema or exudates. Oral hygiene is good. Tongue normal, non obstructing. Hearing intact.  Neck: Supple. Thyroid not palpable. Car 2+/2+ without bruits, nodes or JVD. Chest: Respirations nl with BS clear & equal w/o rales, rhonchi, wheezing or stridor.  Cor: Heart sounds normal w/ regular rate and rhythm without sig. murmurs, gallops, clicks or rubs. Peripheral pulses normal and equal  without edema.  Abdomen: Soft & bowel sounds normal. Non-tender w/o guarding, rebound, hernias, masses or organomegaly.  Lymphatics: Unremarkable.  Musculoskeletal: Full ROM all peripheral extremities, joint stability, 5/5 strength and normal gait.  Skin: Warm, dry without exposed rashes, lesions or ecchymosis apparent.  Neuro: Cranial nerves intact, reflexes equal bilaterally. Sensory-motor testing grossly intact. Tendon reflexes grossly intact.  Pysch: Alert & oriented x 3.  Insight and judgement nl & appropriate. No ideations.  Assessment and Plan:  1. Essential hypertension  - Continue medication, monitor blood pressure at home.  - Continue DASH diet.  Reminder to go to the ER if any CP,  SOB, nausea, dizziness, severe HA, changes vision/speech.  - CBC with Differential/Platelet - COMPLETE METABOLIC PANEL WITH GFR - Magnesium  2. Hyperlipidemia, mixed  - Continue diet/meds, exercise,& lifestyle modifications.  - Continue monitor periodic cholesterol/liver & renal functions   - Lipid panel  3. Abnormal glucose  - Continue diet, exercise, lifestyle modifications.  - Monitor appropriate labs.  - Hemoglobin A1c - Insulin, random  4. Vitamin D deficiency  - Continue supplementation.   - VITAMIN D 25 Hydroxyl  5. Gout  - Uric acid  6. Chronic atrial fibrillation (HCC)  - Protime-INR  7. Hypothyroidism  - TSH  8. Long term current use of anticoagulant therapy  -  Protime-INR  9. Medication management  - guaiFENesin (MUCINEX) 600 MG 12 hr tablet; Take 600 mg by mouth 2 (two) times daily. - CBC with Differential/Platelet - COMPLETE METABOLIC PANEL WITH GFR - Magnesium - Lipid panel - Hemoglobin A1c - Insulin, random - VITAMIN D 25 Hydroxy - Uric acid  10. Rhinitis, unspecified type  - ipratropium (ATROVENT) 0.06 % nasal spray; Use 1 to 2 sprays each Nostril 2 to 3 x / day as needed  Dispense: 45 mL; Refill: 3       Discussed  regular exercise, BP monitoring, weight control to achieve/maintain BMI less than 25 and discussed med and SE's. Recommended labs to assess and monitor clinical status with further disposition pending results of labs. Over 30 minutes of exam, counseling, chart review was performed.

## 2017-07-06 ENCOUNTER — Other Ambulatory Visit: Payer: Self-pay | Admitting: *Deleted

## 2017-07-08 ENCOUNTER — Ambulatory Visit: Payer: Medicare Other | Admitting: Internal Medicine

## 2017-07-08 ENCOUNTER — Encounter: Payer: Self-pay | Admitting: Internal Medicine

## 2017-07-08 VITALS — BP 122/64 | HR 81 | Ht 63.0 in | Wt 204.0 lb

## 2017-07-08 DIAGNOSIS — I482 Chronic atrial fibrillation: Secondary | ICD-10-CM | POA: Diagnosis not present

## 2017-07-08 DIAGNOSIS — I4821 Permanent atrial fibrillation: Secondary | ICD-10-CM

## 2017-07-08 DIAGNOSIS — I5042 Chronic combined systolic (congestive) and diastolic (congestive) heart failure: Secondary | ICD-10-CM

## 2017-07-08 DIAGNOSIS — I10 Essential (primary) hypertension: Secondary | ICD-10-CM

## 2017-07-08 MED ORDER — FUROSEMIDE 80 MG PO TABS: ORAL_TABLET | ORAL | 3 refills | Status: DC

## 2017-07-08 NOTE — Progress Notes (Signed)
HPI Katie [name] returns today for follow-up of her chronic dyspnea in the setting of chronic atrial fibrillation, long-standing pulmonary artery hypertension, mild left ventricular systolic function, and chronic lung disease with abnormal pulmonary function testing and CT scanning of the chest.  Her most recent DLCO was less than 50.  A CT scan of the chest demonstrated possible interstitial lung disease though not thought to be typical pulmonary fibrosis.  The patient has had difficulty losing weight.  She denies syncope.  Her ventricular rate and atrial fibrillation has been well controlled for many years.  She denies anginal symptoms.  She has chronic peripheral edema.  She has a history of coronary artery disease based on CT scanning though previously coronary angiography demonstrated nonobstructive disease.  She is status post endovascular repair of an abdominal aortic aneurysm. Allergies  Allergen Reactions  . Amiodarone Other (See Comments)    PULMONARY TOXICITY  . Diovan [Valsartan] Other (See Comments)    HYPOTENSION  . Doxycycline Diarrhea and Other (See Comments)    VISUAL DISTURBANCE  . Flexeril [Cyclobenzaprine] Other (See Comments)    FATIGUE  . Keflex [Cephalexin] Diarrhea  . Verapamil Other (See Comments)    EDEMA  . Codeine Other (See Comments)    unknown     Current Outpatient Medications  Medication Sig Dispense Refill  . acetaminophen (TYLENOL) 500 MG tablet Take 1,000 mg by mouth daily.     Marland Kitchen allopurinol (ZYLOPRIM) 300 MG tablet Take 300 mg by mouth daily.    Marland Kitchen aspirin EC 81 MG tablet Take 81 mg by mouth daily.    . benzonatate (TESSALON) 200 MG capsule Take 1 perle 3 x / day to prevent cough (Patient taking differently: as needed. Take 1 perle 3 x / day to prevent cough) 30 capsule 1  . Cholecalciferol (VITAMIN D3) 5000 units CAPS Takes 2 caps (10,000 units) daily (Patient taking differently: Take 5,000 Units by mouth daily. )    . digoxin (LANOXIN) 0.125 MG  tablet Take 1 tablet daily for Heart Rhythm (Patient taking differently: Take 0.125 mg by mouth daily. Take 1 tablet daily for Heart Rhythm) 90 tablet 3  . diltiazem (TIAZAC) 120 MG 24 hr capsule TAKE 1 CAPSULE BY MOUTH EVERY DAY 90 capsule 1  . Fluticasone-Umeclidin-Vilant (TRELEGY ELLIPTA) 100-62.5-25 MCG/INH AEPB Inhale 1 puff into the lungs daily. 60 each 3  . gabapentin (NEURONTIN) 600 MG tablet Takes 1/2 to 1 tablet 3 times daily. 270 tablet 1  . guaiFENesin (MUCINEX) 600 MG 12 hr tablet Take 600 mg by mouth 2 (two) times daily.    Marland Kitchen ipratropium (ATROVENT) 0.02 % nebulizer solution Take 2.5 mLs (0.5 mg total) by nebulization every 4 (four) hours as needed for wheezing or shortness of breath. 75 mL 2  . ipratropium (ATROVENT) 0.06 % nasal spray Use 1 to 2 sprays each Nostril 2 to 3 x / day as needed 45 mL 3  . Lactobacillus (PROBIOTIC ACIDOPHILUS PO) Take by mouth.    . levalbuterol (XOPENEX HFA) 45 MCG/ACT inhaler Inhale 2 puffs into the lungs every 4 (four) hours as needed for wheezing. 1 Inhaler 3  . levothyroxine (SYNTHROID, LEVOTHROID) 50 MCG tablet TAKE 1 TABLET BY MOUTH EVERY DAY (Patient taking differently: Take 1.5 tablets on all days) 90 tablet 1  . mirabegron ER (MYRBETRIQ) 25 MG TB24 tablet Take 1 tablet (25 mg total) by mouth daily. (Patient taking differently: Take 25 mg by mouth as needed. ) 30 tablet 0  .  montelukast (SINGULAIR) 10 MG tablet TAKE 1 TABLET EVERY DAY FOR ALLERGIES 90 tablet 1  . Olopatadine HCl 0.2 % SOLN Place 1 drop into both eyes daily.     . pantoprazole (PROTONIX) 40 MG tablet Take 1 tablet (40 mg total) by mouth daily.    [name] Glycol-Propyl Glycol (SYSTANE OP) Apply 1 drop to eye 4 (four) times daily.     . potassium chloride SA (KLOR-CON M20) 20 MEQ tablet TAKE 2 TABLETS (40 MEQ TOTAL) BY MOUTH DAILY. (Patient taking differently: Take 40 mEq by mouth daily. ) 180 tablet 1  . pravastatin (PRAVACHOL) 40 MG tablet TAKE 1/2 TABLET BY MOUTH AT BEDTIME FOR  CHOLESTEROL 90 tablet 1  . prednisoLONE acetate (PRED FORTE) 1 % ophthalmic suspension INSTILL 1 DROP INTO BOTH EYES FOUR TIMES A DAY FOR 10 DAYS AND THEN TWICE DAY.  1  . prochlorperazine (COMPAZINE) 5 MG tablet TAKE 1 TABLET BY MOUTH THREE TIMES A DAY AS NEEDED FOR VERTIGO OR NAUSEA 60 tablet 0  . triamcinolone cream (KENALOG) 0.1 % Apply 1 application topically at bedtime.    Marland Kitchen warfarin (COUMADIN) 3 MG tablet TAKE 1-2 TABLETS DAILY AS DIRECTED (Patient taking differently: 2 mg. TAKE 1-2 TABLETS DAILY AS DIRECTED) 180 tablet 1  . furosemide (LASIX) 80 MG tablet Take 80 mg in the AM.  Take 40 mg .5 tablet at lunch every other day. 120 tablet 3   No current facility-administered medications for this visit.      Past Medical History:  Diagnosis Date  . Atrial fibrillation ([place])   . Migraine headache   . Osteopenia     ROS:   All systems reviewed and negative except as noted in the HPI.   Past Surgical History:  Procedure Laterality Date  . ABDOMINAL AORTIC ENDOVASCULAR STENT GRAFT N/A 04/11/2013   Procedure: ABDOMINAL AORTIC ENDOVASCULAR STENT GRAFT WITH RIGHT FEMORAL PATCH ANGIOPLASTY;  Surgeon: Mal Misty, MD;  Location: [facility];  Service: Vascular;  Laterality: N/A;  . BREAST SURGERY     LEFT BREAST BIOPSY  . broken leg Left 1970s  . CARDIAC CATHETERIZATION    . CHOLECYSTECTOMY  1972  . COLONOSCOPY    . EYE SURGERY Bilateral   . Laser vein procedure    . REFRACTIVE SURGERY    . TONSILLECTOMY       Family History  Problem Relation Age of Onset  . Cirrhosis Mother   . Cancer Mother 1       PANCREAS  . Heart defect Sister   . Breast cancer Sister        age 3  . Heart disease Sister   . Stroke Sister   . Alcohol abuse Father   . Depression Father   . Hypertension Brother   . Hyperlipidemia Son   . Heart disease Daughter      Social History   Socioeconomic History  . Marital status: Married    Spouse name: Not on file  . Number of children: Not on file    . Years of education: Not on file  . Highest education level: Not on file  Occupational History  . Not on file  Social Needs  . Financial resource strain: Not on file  . Food insecurity:    Worry: Not on file    Inability: Not on file  . Transportation needs:    Medical: Not on file    Non-medical: Not on file  Tobacco Use  . Smoking status: Former Smoker  Types: Cigarettes    Last attempt to quit: 08/10/2002    Years since quitting: 14.9  . Smokeless tobacco: Never Used  . Tobacco comment: History of tobacco abuse  Substance and Sexual Activity  . Alcohol use: Yes    Alcohol/week: 0.0 oz    Comment: rare  . Drug use: No  . Sexual activity: Yes    Birth control/protection: Post-menopausal  Lifestyle  . Physical activity:    Days per week: Not on file    Minutes per session: Not on file  . Stress: Not on file  Relationships  . Social connections:    Talks on phone: Not on file    Gets together: Not on file    Attends religious service: Not on file    Active member of club or organization: Not on file    Attends meetings of clubs or organizations: Not on file    Relationship status: Not on file  . Intimate partner violence:    Fear of current or ex partner: Not on file    Emotionally abused: Not on file    Physically abused: Not on file    Forced sexual activity: Not on file  Other Topics Concern  . Not on file  Social History Narrative   Lives in [place] with husband   One daughter     BP 122/64   Pulse 81   Ht 5\' 3"  (1.6 m)   Wt 204 lb (92.5 kg)   BMI 36.14 kg/m   Physical Exam:  Well appearing NAD HEENT: Unremarkable Neck: 6 cm JVD, no thyromegally Lymphatics:  No adenopathy Back:  No CVA tenderness Lungs:  Clear with scattered rales throughout but no increased work of breathing HEART:  Regular rate rhythm, no murmurs, no rubs, no clicks Abd:  soft, positive bowel sounds, no organomegally, no rebound, no guarding Ext:  2 plus pulses, no  edema, no cyanosis, no clubbing Skin:  No rashes no nodules Neuro:  CN II through XII intact, motor grossly intact  EKG -atrial fibrillation with occasional PVCs.  She has an intraventricular conduction delay   Assess/Plan: 1.  Atrial fibrillation -her ventricular rate is well controlled.  She will continue systemic anticoagulation with warfarin. 2.  Coronary artery disease -she is fairly sedentary but denies angina.  Previously she had nonobstructive disease on cardiac catheterization. 3.  Interstitial lung disease -she has a marked reduction in her DLCO on pulmonary function testing and an abnormal CT of her chest.  She has follow-up with Dr. Melvyn Novas. 4.  Peripheral vascular disease -she is status post endovascular repair of her abdominal aortic aneurysm.  She does not have much in the way of claudication.  This may be because of her sedentary lifestyle.  She has gained over 50 pounds in the past 10 years or so.  Cristopher Peru, MD

## 2017-07-08 NOTE — Patient Instructions (Addendum)
Medication Instructions:  Your physician has recommended you make the following change in your medication: 1.  Continue to take furesomide 80 mg one tablet by mouth daily--- ----now start taking 1/2 tablet (40 mg) of furosemide at lunch time every other day...  Labwork: You will come to [facility] office in 2 weeks for a BMP ---please schedule.  Testing/Procedures: None ordered.  Follow-Up: Your physician wants you to follow-up in: 4 months with Dr. Lovena Le.     Any Other Special Instructions Will Be Listed Below (If Applicable).  If you need a refill on your cardiac medications before your next appointment, please call your pharmacy.

## 2017-07-11 ENCOUNTER — Ambulatory Visit: Payer: Medicare Other | Admitting: Internal Medicine

## 2017-07-11 ENCOUNTER — Encounter: Payer: Self-pay | Admitting: Internal Medicine

## 2017-07-11 ENCOUNTER — Telehealth: Payer: Self-pay | Admitting: *Deleted

## 2017-07-11 ENCOUNTER — Other Ambulatory Visit (INDEPENDENT_AMBULATORY_CARE_PROVIDER_SITE_OTHER): Payer: Medicare Other

## 2017-07-11 VITALS — BP 112/76 | HR 66 | Ht 63.5 in | Wt 202.0 lb

## 2017-07-11 DIAGNOSIS — J8410 Pulmonary fibrosis, unspecified: Secondary | ICD-10-CM

## 2017-07-11 DIAGNOSIS — R0609 Other forms of dyspnea: Secondary | ICD-10-CM

## 2017-07-11 LAB — LIPID PANEL
CHOL/HDL RATIO: 3.9 (calc) (ref <5.0)
CHOLESTEROL: 155 mg/dL (ref <200)
HDL: 40 mg/dL — ABNORMAL LOW (ref >50)
LDL Cholesterol (Calc): 89 mg/dL (calc)
Non-HDL Cholesterol (Calc): 115 mg/dL (calc) (ref <130)
Triglycerides: 166 mg/dL — ABNORMAL HIGH (ref <150)

## 2017-07-11 LAB — CBC WITH DIFFERENTIAL/PLATELET
Basophils Absolute: 61 cells/uL (ref 0–200)
Basophils Relative: 0.6 %
EOS ABS: 235 {cells}/uL (ref 15–500)
Eosinophils Relative: 2.3 %
HEMATOCRIT: 43 % (ref 35.0–45.0)
Hemoglobin: 14.7 g/dL (ref 11.7–15.5)
LYMPHS ABS: 1775 {cells}/uL (ref 850–3900)
MCH: 30.3 pg (ref 27.0–33.0)
MCHC: 34.2 g/dL (ref 32.0–36.0)
MCV: 88.7 fL (ref 80.0–100.0)
MPV: 10.5 fL (ref 7.5–12.5)
Monocytes Relative: 9.3 %
NEUTROS PCT: 70.4 %
Neutro Abs: 7181 cells/uL (ref 1500–7800)
Platelets: 296 10*3/uL (ref 140–400)
RBC: 4.85 10*6/uL (ref 3.80–5.10)
RDW: 15.4 % — AB (ref 11.0–15.0)
Total Lymphocyte: 17.4 %
WBC: 10.2 10*3/uL (ref 3.8–10.8)
WBCMIX: 949 {cells}/uL (ref 200–950)

## 2017-07-11 LAB — COMPLETE METABOLIC PANEL WITH GFR
AG RATIO: 1.6 (calc) (ref 1.0–2.5)
ALKALINE PHOSPHATASE (APISO): 38 U/L (ref 33–130)
ALT: 15 U/L (ref 6–29)
AST: 16 U/L (ref 10–35)
Albumin: 4.1 g/dL (ref 3.6–5.1)
BUN/Creatinine Ratio: 18 (calc) (ref 6–22)
BUN: 20 mg/dL (ref 7–25)
CALCIUM: 9.7 mg/dL (ref 8.6–10.4)
CO2: 29 mmol/L (ref 20–32)
Chloride: 102 mmol/L (ref 98–110)
Creat: 1.11 mg/dL — ABNORMAL HIGH (ref 0.60–0.93)
GFR, EST NON AFRICAN AMERICAN: 49 mL/min/{1.73_m2} — AB (ref >=60)
GFR, Est African American: 56 mL/min/{1.73_m2} — ABNORMAL LOW (ref >=60)
GLOBULIN: 2.5 g/dL (ref 1.9–3.7)
Glucose, Bld: 67 mg/dL (ref 65–99)
POTASSIUM: 4.5 mmol/L (ref 3.5–5.3)
SODIUM: 140 mmol/L (ref 135–146)
Total Bilirubin: 0.6 mg/dL (ref 0.2–1.2)
Total Protein: 6.6 g/dL (ref 6.1–8.1)

## 2017-07-11 LAB — INSULIN, RANDOM: Insulin: 25.8 u[IU]/mL — ABNORMAL HIGH (ref 2.0–19.6)

## 2017-07-11 LAB — URIC ACID: Uric Acid, Serum: 4.8 mg/dL (ref 2.5–7.0)

## 2017-07-11 LAB — HEMOGLOBIN A1C
HEMOGLOBIN A1C: 5.8 %{Hb} — AB (ref <5.7)
MEAN PLASMA GLUCOSE: 120 (calc)
eAG (mmol/L): 6.6 (calc)

## 2017-07-11 LAB — MAGNESIUM: Magnesium: 2.1 mg/dL (ref 1.5–2.5)

## 2017-07-11 LAB — PROTIME-INR
INR: 1.4 — AB
Prothrombin Time: 15.3 s — ABNORMAL HIGH (ref 9.0–11.5)

## 2017-07-11 LAB — VITAMIN D 25 HYDROXY (VIT D DEFICIENCY, FRACTURES): Vit D, 25-Hydroxy: 72 ng/mL (ref 30–100)

## 2017-07-11 LAB — SEDIMENTATION RATE: Sed Rate: 48 mm/hr — ABNORMAL HIGH (ref 0–30)

## 2017-07-11 NOTE — Telephone Encounter (Signed)
Patient called and reported  She labs drawn at pulmonary and her hand has a lump at the draw site.  Per Dr Melford Aase, apply ice the first 12 hours and the heat.  The patient is aware.

## 2017-07-11 NOTE — Progress Notes (Signed)
Subjective:     Patient ID: Katie [name], female   DOB: [birth year],    MRN: [id number]    Brief patient profile:  89 yowf quit smoking  2004 with variable sob but on best days can do housework  With no copd as of last pfts 2017 referred back to pulmonary clinic 07/11/2017 p admit:   Admit date: 05/07/2017 Discharge date: 05/10/2017      Recommendations for Outpatient Follow-up:  1. Follow up with PCP in 1-2 weeks 2. Recommend outpatient esophageal evaluation - pt with esophageal dysphagia 3. Please obtain BMP in one week to monitor creatinine and potassium 4. Please follow up on the following pending results: Blood culture pending so far negative     Brief/Interim Summary: For full details see H&P/Progress note, but in brief, Katie ENRIQUES is a 77 year old female with medical history of A. fib, CHF, hypertension, CKD, COPD, hypothyroidism and history of migraine who presented to the emergency department with cough,associated with shortness of breath, confusion and lethargy.In the ER he was found to be in A. fib with RVR and hypotensive shock as well febrile with imperative of 102.3. Influenza A positive. Patient was started on IV fluids and empiric antibiotic and she was admitted to the ICU due to septic shock not responding to IV fluid. Subsequently started on levophed,which eventually was weaned off, and patient blood pressure was normalized. Patient also received Cardizem drip for a short period time due to hypotension.  Patient breathing significantly improved, A. fib is well controlled and confusion has resolved.  Renal function has come back to baseline.  This point patient remains afebrile, receiving Tamiflu and is being clinically stable for discharge.  Subjective: Patient seen and examined, report feeling weak, PT has evaluated patient and recommended home health PT, complaining of having difficulty with swallowing.  Patient has a history of hiatal hernia, swallow eval was  done suspecting esophageal dysphagia.  Discharge Diagnoses/Hospital Course:  Septic shock due to influenza A / Acute respiratory failure with hypoxia  Continue Tamiflu for 2 more days Blood cultures no growth so far Was treated with pressor for short period of time  Patient was initially treated with vancomycin and Zosyn no signs of bacterial infection we will discontinue antibiotics. Will send on Prednisone taper for 14 days  Successfully weaned of oxigen Follow up with PCP   Acute metabolic encephalopathy Due to infectious process and hypoxia  Resolved Continue to treat underlying causes  Chronic A. fib with RVR - stable Due to infectious process Initially treated with Cardizem drip which has been transitioned to oral diltiazem.  Home medications were continued including Cardizem and digoxin. Anticoagulation with Coumadin which was held due to elevated INR Will hold Coumadin tonight and resume home medication tomorrow.  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 Check BMP in 1 week  Chronic diastolic CHF No signs of fluid overload Echocardiogram with EF 45%, no significant changes from previous Follow-up with PCP  Hypertension BP stable Continue home regimen without any changes   All other chronic medical condition were stable during the hospitalization.  Patient was seen by physical therapy, recommending home health PT On the day of the discharge the patient's vitals were stable, and no other acute medical condition were reported by patient. the patient was felt safe to be discharge to home  07/11/2017  f/u ov/Shawntee Mainwaring re: ? ILD s/p sepsis  Chief Complaint  Patient presents with  . Follow-up    Breathing has improved some, but not back to her normal baseline. She has been  using proair and atrovent neb at least 2 x daily.    Dyspnea:  Main limit = can no longer do  yard work/ mopping more than two rooms but really more limited by knees than breathing Cough: none  Sleep: 2 pillows  trelegy each  am  X  2weeks no real change in any symptoms she's aware of and still over using saba/nebs per hosp d/c instructions   No obvious day to day or daytime variability or assoc excess/ purulent sputum or mucus plugs or hemoptysis or cp or chest tightness, subjective wheeze or overt sinus or hb symptoms. No unusual exposure hx or h/o childhood pna/ asthma or knowledge of premature birth.  Sleeping  Ok on 2 pillows   without nocturnal  or early am exacerbation  of respiratory  c/o's or need for noct saba. Also denies any obvious fluctuation of symptoms with weather or environmental changes or other aggravating or alleviating factors except as outlined above   Current Allergies, Complete Past Medical History, Past Surgical History, Family History, and Social History were reviewed in [employer] record.  ROS  The following are not active complaints unless bolded Hoarseness, sore throat, dysphagia, dental problems, itching, sneezing,  nasal congestion or discharge of excess mucus or purulent secretions, ear ache,   fever, chills, sweats, unintended wt loss or wt gain, classically pleuritic or exertional cp,  orthopnea pnd or arm/hand swelling  or leg swelling improved, presyncope, palpitations, abdominal pain, anorexia, nausea, vomiting, diarrhea  or change in bowel habits or change in bladder habits, change in stools or change in urine, dysuria, hematuria,  rash, arthralgias, visual complaints, headache, numbness, weakness or ataxia or problems with walking or coordination,  change in mood or  memory.        Current Meds  Medication Sig  . acetaminophen (TYLENOL) 500 MG tablet Take 1,000 mg by mouth daily.   Marland Kitchen albuterol (PROAIR HFA) 108 (90 Base) MCG/ACT  inhaler Inhale 2 puffs into the lungs every 6 (six) hours as needed for wheezing or shortness of breath.  . allopurinol (ZYLOPRIM) 300 MG tablet Take 300 mg by mouth daily.  Marland Kitchen aspirin EC 81 MG tablet Take 81 mg by mouth daily.  . Cholecalciferol (VITAMIN D3) 5000 units CAPS Takes 2 caps (10,000 units) daily (Patient taking differently: Take 5,000 Units by mouth daily. )  . digoxin (LANOXIN) 0.125 MG tablet Take 1 tablet daily for Heart Rhythm (Patient taking differently: Take 0.125 mg by mouth daily. Take 1 tablet daily for Heart Rhythm)  . diltiazem (TIAZAC) 120 MG 24 hr capsule TAKE 1 CAPSULE BY MOUTH EVERY DAY  . Fluticasone-Umeclidin-Vilant (TRELEGY ELLIPTA) 100-62.5-25 MCG/INH AEPB Inhale 1 puff into the lungs daily.  . furosemide (LASIX) 80 MG tablet Take 80 mg in the AM.  Take 40 mg .5 tablet at lunch every other day.  . gabapentin (NEURONTIN) 600 MG tablet Takes 1/2 to 1 tablet 3 times daily.  Marland Kitchen ipratropium (ATROVENT) 0.02 % nebulizer solution Take 2.5 mLs (0.5 mg total) by nebulization every 4 (four) hours as needed for wheezing or shortness of breath.  Marland Kitchen ipratropium (ATROVENT) 0.06 % nasal spray Use 1 to 2 sprays each Nostril 2 to 3 x / day as needed  . levalbuterol (XOPENEX HFA) 45 MCG/ACT  inhaler Inhale 2 puffs into the lungs every 4 (four) hours as needed for wheezing.  Marland Kitchen levothyroxine (SYNTHROID, LEVOTHROID) 50 MCG tablet TAKE 1 TABLET BY MOUTH EVERY DAY (Patient taking differently: Take 1.5 tablets on all days)  . montelukast (SINGULAIR) 10 MG tablet TAKE 1 TABLET EVERY DAY FOR ALLERGIES  . Olopatadine HCl 0.2 % SOLN Place 1 drop into both eyes daily.   . pantoprazole (PROTONIX) 40 MG tablet Take 1 tablet (40 mg total) by mouth daily.  [name] Glycol-Propyl Glycol (SYSTANE OP) Apply 1 drop to eye 4 (four) times daily.   . potassium chloride SA (KLOR-CON M20) 20 MEQ tablet TAKE 2 TABLETS (40 MEQ TOTAL) BY MOUTH DAILY. (Patient taking differently: Take 40 mEq by mouth daily. )  .  pravastatin (PRAVACHOL) 40 MG tablet TAKE 1/2 TABLET BY MOUTH AT BEDTIME FOR CHOLESTEROL  . prednisoLONE acetate (PRED FORTE) 1 % ophthalmic suspension INSTILL 1 DROP INTO BOTH EYES FOUR TIMES A DAY FOR 10 DAYS AND THEN TWICE DAY.  Marland Kitchen prochlorperazine (COMPAZINE) 5 MG tablet TAKE 1 TABLET BY MOUTH THREE TIMES A DAY AS NEEDED FOR VERTIGO OR NAUSEA  . triamcinolone cream (KENALOG) 0.1 % Apply 1 application topically at bedtime.  Marland Kitchen warfarin (COUMADIN) 3 MG tablet TAKE 1-2 TABLETS DAILY AS DIRECTED (Patient taking differently: 2 mg. TAKE 1-2 TABLETS DAILY AS DIRECTED)              Objective:   Physical Exam   amb anxious wf nad - tends to focus on dx she says she was given in past   " bronchitis"  "heart stretched" instead of answering questions directly re her symptoms   07/11/2017       202   02/16/2016      200  01/07/16 198 lb (89.8 kg)  01/02/16 197 lb 12.8 oz (89.7 kg)  12/30/15 201 lb 14.4 oz (91.6 kg)     Vital signs reviewed - Note on arrival 02 sats  97% on RA      HEENT: nl dentition, turbinates bilaterally, and oropharynx. Nl external ear canals without cough reflex   NECK :  without JVD/Nodes/TM/ nl carotid upstrokes bilaterally   LUNGS: no acc muscle use,  Nl contour chest which is clear to A and P bilaterally without cough on insp or exp maneuvers   CV:  RRR  no s3 or murmur or increase in P2, and no edema   ABD:  soft and nontender with nl inspiratory excursion in the supine position. No bruits or organomegaly appreciated, bowel sounds nl  MS:  Nl gait/ ext warm without deformities, calf tenderness, cyanosis or clubbing No obvious joint restrictions   SKIN: warm and dry without lesions    NEURO:  alert, approp, nl sensorium with  no motor or cerebellar deficits apparent.      I personally reviewed images and agree with radiology impression as follows:   Chest CT 07/05/17 1. Interstitial lung disease is stable from 06/02/2016 but somewhat progressive from  12/25/2015 and may be due to fibrotic nonspecific interstitial pneumonitis. Usual interstitial pneumonitis is not excluded but lack of a craniocaudal gradient argues against it. 2. New areas of confluent nodular consolidation in the right upper lobe, right lower lobe and lingula, possibly infectious or inflammatory in etiology. Malignancy cannot be excluded. Follow-up CT chest without contrast is recommended in 2-3 months in further evaluation, as clinically indicated.      Labs ordered 07/11/2017   Quant TB/ quant ig's  Lab Results  Component Value Date   ESRSEDRATE 16 (H) 07/11/2017

## 2017-07-11 NOTE — Patient Instructions (Addendum)
Keep walking and let me know if there is a pattern of lower 02 levels when you walk   We will arrange for another High resolution ct chest after your next visit   Please remember to go to the lab department downstairs in the basement  for your tests - we will call you with the results when they are available.     Please schedule a follow up visit in 3 months but call sooner if needed  with all medications /inhalers/ solutions in hand so we can verify exactly what you are taking. This includes all medications from all doctors and over the counters

## 2017-07-12 ENCOUNTER — Ambulatory Visit: Payer: Medicare Other | Admitting: Gastroenterology

## 2017-07-12 ENCOUNTER — Encounter: Payer: Self-pay | Admitting: Internal Medicine

## 2017-07-12 ENCOUNTER — Other Ambulatory Visit: Payer: Medicare Other

## 2017-07-12 DIAGNOSIS — R0609 Other forms of dyspnea: Principal | ICD-10-CM

## 2017-07-12 LAB — RESPIRATORY ALLERGY PROFILE REGION II ~~LOC~~
Allergen, A. alternata, m6: <0.1 kU/L
Allergen, Cedar tree, t12: <0.1 kU/L
Allergen, D pternoyssinus,d7: <0.1 kU/L
Allergen, Mouse Urine Protein, e78: <0.1 kU/L
Allergen, Mulberry, t76: <0.1 kU/L
Allergen, Oak,t7: <0.1 kU/L
Allergen, P. notatum, m1: <0.1 kU/L
Aspergillus fumigatus, m3: <0.1 kU/L
CLASS: 0
CLASS: 0
CLASS: 0
CLASS: 0
CLASS: 0
CLASS: 0
CLASS: 0
CLASS: 0
CLASS: 0
CLASS: 0
CLASS: 0
CLASS: 0
CLASS: 0
COMMON RAGWEED (SHORT) (W1) IGE: <0.1 kU/L
Cat Dander: <0.1 kU/L
Class: 0
Class: 0
Class: 0
Class: 0
Class: 0
Class: 0
Class: 0
Class: 0
Class: 0
Class: 0
Class: 0
D. farinae: <0.1 kU/L
Elm IgE: <0.1 kU/L
IgE (Immunoglobulin E), Serum: 42 kU/L (ref <=114)
Johnson Grass: <0.1 kU/L
Rough Pigweed  IgE: <0.1 kU/L
Sheep Sorrel IgE: <0.1 kU/L
Timothy Grass: <0.1 kU/L

## 2017-07-12 LAB — CYCLIC CITRUL PEPTIDE ANTIBODY, IGG: CYCLIC CITRULLIN PEPTIDE AB: 35 U — AB

## 2017-07-12 LAB — IGG, IGA, IGM
IGG (IMMUNOGLOBIN G), SERUM: 886 mg/dL (ref 694–1618)
IMMUNOGLOBULIN A: 184 mg/dL (ref 81–463)
IgM, Serum: 101 mg/dL (ref 48–271)

## 2017-07-12 LAB — INTERPRETATION:

## 2017-07-12 LAB — RHEUMATOID FACTOR

## 2017-07-12 NOTE — Assessment & Plan Note (Addendum)
Last exp to amiodarone ? 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 But to be sure can do repeat study in @ 3 months along with CT sinus to try to sort out if there is a source of reinfection here explaining tendency to CAP - Quant Ig's also ordered.   I had an extended discussion with the patient and very attentive daughter reviewing all relevant studies completed to date (including recent admit)  and  lasting 25 minutes of a 40  minute re-establish office visit  re  severe non-specific but potentially very serious refractory respiratory symptoms of uncertain and potentially multiple  etiologies.   Each maintenance medication was reviewed in detail including most importantly the difference between maintenance and prns and under what circumstances the prns are to be triggered using an action plan format that is not reflected in the computer generated alphabetically organized AVS.    Please see AVS for specific instructions unique to this office visit that I personally wrote and verbalized to the the pt in detail and then reviewed with pt  by my nurse highlighting any changes in therapy/plan of care  recommended at today's visit.

## 2017-07-12 NOTE — Assessment & Plan Note (Addendum)
-    PFT's  02/16/2016  FEV1 1.52 (70 % ) ratio 74   p no % improvement from saba p nothing prior to study with DLCO   52/49  % corrects to 69 % for alv volume   ERV 54  - 07/11/2017   Walked RA  2 laps @ 185 ft each stopped due to  Knee pain with sats steady at 94% slow  pace    Clearly multifactorial and not copd and also  doubt significant ILD at present but needs serial self monitoring for "points on the curve" re ? Of ILD progression/ reviewed/ no change in rx need for now  Also no clear indication for trelegy but would like to see her wean off all the forms of saba she's using before trying off the trelegy as could have an element of asthma here.    see avs for instructions unique to this ov

## 2017-07-13 ENCOUNTER — Encounter: Payer: Self-pay | Admitting: Internal Medicine

## 2017-07-13 LAB — ANA: ANA: POSITIVE — AB

## 2017-07-13 LAB — QUANTIFERON-TB GOLD PLUS
Mitogen-NIL: 7.19 IU/mL
NIL: 0.05 [IU]/mL
QUANTIFERON-TB GOLD PLUS: NEGATIVE
TB1-NIL: <0 IU/mL
TB2-NIL: 0 [IU]/mL

## 2017-07-13 LAB — ANTI-NUCLEAR AB-TITER (ANA TITER): ANA Titer 1: 1:40 {titer} — ABNORMAL HIGH

## 2017-07-16 LAB — HYPERSENSITIVITY PNUEMONITIS PROFILE
ASPERGILLUS FUMIGATUS: NEGATIVE
Faenia retivirgula: NEGATIVE
Pigeon Serum: NEGATIVE
S. VIRIDIS: NEGATIVE
T. CANDIDUS: NEGATIVE
T. VULGARIS: NEGATIVE

## 2017-07-19 ENCOUNTER — Telehealth: Payer: Self-pay | Admitting: *Deleted

## 2017-07-19 NOTE — Telephone Encounter (Signed)
Patient called to report she had a tick removed this morning and the are is red and itching.  Per Dr Melford Aase, wash the area and apply a cortisone cream.  She was advised to call if a rash developed.

## 2017-07-20 ENCOUNTER — Encounter: Payer: Self-pay | Admitting: Internal Medicine

## 2017-07-20 ENCOUNTER — Ambulatory Visit (INDEPENDENT_AMBULATORY_CARE_PROVIDER_SITE_OTHER): Payer: Medicare Other | Admitting: Internal Medicine

## 2017-07-20 VITALS — BP 132/64 | HR 80 | Temp 97.2°F | Resp 18 | Ht 63.0 in | Wt 207.0 lb

## 2017-07-20 DIAGNOSIS — W57XXXA Bitten or stung by nonvenomous insect and other nonvenomous arthropods, initial encounter: Secondary | ICD-10-CM

## 2017-07-20 DIAGNOSIS — Z79899 Other long term (current) drug therapy: Secondary | ICD-10-CM

## 2017-07-20 DIAGNOSIS — H04123 Dry eye syndrome of bilateral lacrimal glands: Secondary | ICD-10-CM | POA: Diagnosis not present

## 2017-07-20 DIAGNOSIS — H01021 Squamous blepharitis right upper eyelid: Secondary | ICD-10-CM | POA: Diagnosis not present

## 2017-07-20 DIAGNOSIS — J4520 Mild intermittent asthma, uncomplicated: Secondary | ICD-10-CM | POA: Diagnosis not present

## 2017-07-20 DIAGNOSIS — H01022 Squamous blepharitis right lower eyelid: Secondary | ICD-10-CM | POA: Diagnosis not present

## 2017-07-20 DIAGNOSIS — H01024 Squamous blepharitis left upper eyelid: Secondary | ICD-10-CM | POA: Diagnosis not present

## 2017-07-20 DIAGNOSIS — L03114 Cellulitis of left upper limb: Secondary | ICD-10-CM | POA: Diagnosis not present

## 2017-07-20 DIAGNOSIS — H01025 Squamous blepharitis left lower eyelid: Secondary | ICD-10-CM | POA: Diagnosis not present

## 2017-07-20 MED ORDER — DEXAMETHASONE 0.5 MG PO TABS
ORAL_TABLET | ORAL | 0 refills | Status: DC
Start: 2017-07-20 — End: 2017-08-30

## 2017-07-20 MED ORDER — ALBUTEROL SULFATE HFA 108 (90 BASE) MCG/ACT IN AERS: INHALATION_SPRAY | RESPIRATORY_TRACT | 3 refills | Status: DC

## 2017-07-20 MED ORDER — DOXYCYCLINE HYCLATE 100 MG PO CAPS: ORAL_CAPSULE | ORAL | 0 refills | Status: DC

## 2017-07-20 NOTE — Patient Instructions (Signed)
   Please Cut Coumadin dose   in 1/2 while on Antibiotic

## 2017-07-20 NOTE — Progress Notes (Signed)
Subjective:    Patient ID: Katie [name], female    DOB: August 17, 1940, 77 y.o.   MRN: [id number]  HPI  This nice 77 yo MWF on Coumadin for cAfib presents with hx/o a tick removed 3 days ago from her Lt arm and in the last 12 -18 hours has developed a 5" area of surrounding inflammation around the site . Tick specimen appears very tiny & to be a "deer tick"    Medication Sig  . acetaminophen (TYLENOL) 500 MG tablet Take 1,000 mg by mouth daily.   Marland Kitchen allopurinol (ZYLOPRIM) 300 MG tablet Take 300 mg by mouth daily.  Marland Kitchen aspirin EC 81 MG tablet Take 81 mg by mouth daily.  . Cholecalciferol (VITAMIN D3) 5000 units CAPS Takes 2 caps (10,000 units) daily (Patient taking differently: Take 5,000 Units by mouth daily. )  . digoxin (LANOXIN) 0.125 MG tablet Take 1 tablet daily for Heart Rhythm (Patient taking differently: Take 0.125 mg by mouth daily. Take 1 tablet daily for Heart Rhythm)  . diltiazem (TIAZAC) 120 MG 24 hr capsule TAKE 1 CAPSULE BY MOUTH EVERY DAY  . Fluticasone-Umeclidin-Vilant (TRELEGY ELLIPTA) 100-62.5-25 MCG/INH AEPB Inhale 1 puff into the lungs daily.  . furosemide (LASIX) 80 MG tablet Take 80 mg in the AM.  Take 40 mg .5 tablet at lunch every other day.  . gabapentin (NEURONTIN) 600 MG tablet Takes 1/2 to 1 tablet 3 times daily.  Marland Kitchen ipratropium (ATROVENT) 0.02 % nebulizer solution Take 2.5 mLs (0.5 mg total) by nebulization every 4 (four) hours as needed for wheezing or shortness of breath.  Marland Kitchen ipratropium (ATROVENT) 0.06 % nasal spray Use 1 to 2 sprays each Nostril 2 to 3 x / day as needed  . levalbuterol (XOPENEX HFA) 45 MCG/ACT inhaler Inhale 2 puffs into the lungs every 4 (four) hours as needed for wheezing.  Marland Kitchen levothyroxine (SYNTHROID, LEVOTHROID) 50 MCG tablet TAKE 1 TABLET BY MOUTH EVERY DAY (Patient taking differently: Take 1.5 tablets on all days)  . montelukast (SINGULAIR) 10 MG tablet TAKE 1 TABLET EVERY DAY FOR ALLERGIES  . Olopatadine HCl 0.2 % SOLN Place 1 drop into both eyes  daily.   . pantoprazole (PROTONIX) 40 MG tablet Take 1 tablet (40 mg total) by mouth daily.  [name] Glycol-Propyl Glycol (SYSTANE OP) Apply 1 drop to eye 4 (four) times daily.   . potassium chloride SA (KLOR-CON M20) 20 MEQ tablet TAKE 2 TABLETS (40 MEQ TOTAL) BY MOUTH DAILY. (Patient taking differently: Take 40 mEq by mouth daily. )  . pravastatin (PRAVACHOL) 40 MG tablet TAKE 1/2 TABLET BY MOUTH AT BEDTIME FOR CHOLESTEROL  . prednisoLONE acetate (PRED FORTE) 1 % ophthalmic suspension INSTILL 1 DROP INTO BOTH EYES FOUR TIMES A DAY FOR 10 DAYS AND THEN TWICE DAY.  Marland Kitchen prochlorperazine (COMPAZINE) 5 MG tablet TAKE 1 TABLET BY MOUTH THREE TIMES A DAY AS NEEDED FOR VERTIGO OR NAUSEA  . triamcinolone cream (KENALOG) 0.1 % Apply 1 application topically at bedtime.  Marland Kitchen warfarin (COUMADIN) 3 MG tablet TAKE 1-2 TABLETS DAILY AS DIRECTED (Patient taking differently: 2 mg. TAKE 1-2 TABLETS DAILY AS DIRECTED)  . albuterol (PROAIR HFA) 108 (90 Base) MCG/ACT inhaler Inhale 2 puffs into the lungs every 6 (six) hours as needed for wheezing or shortness of breath.   No facility-administered medications prior to visit.    Allergies  Allergen Reactions  . Amiodarone Other (See Comments)    PULMONARY TOXICITY  . Diovan [Valsartan] Other (See Comments)  HYPOTENSION  . Doxycycline Diarrhea and Other (See Comments)    VISUAL DISTURBANCE  . Flexeril [Cyclobenzaprine] Other (See Comments)    FATIGUE  . Keflex [Cephalexin] Diarrhea  . Verapamil Other (See Comments)    EDEMA  . Codeine Other (See Comments)    unknown   Past Medical History:  Diagnosis Date  . Atrial fibrillation ([place])   . Migraine headache   . Osteopenia    Past Surgical History:  Procedure Laterality Date  . ABDOMINAL AORTIC ENDOVASCULAR STENT GRAFT N/A 04/11/2013   Procedure: ABDOMINAL AORTIC ENDOVASCULAR STENT GRAFT WITH RIGHT FEMORAL PATCH ANGIOPLASTY;  Surgeon: Mal Misty, MD;  Location: [place];  Service: Vascular;   Laterality: N/A;  . BREAST SURGERY     LEFT BREAST BIOPSY  . broken leg Left 1970s  . CARDIAC CATHETERIZATION    . CHOLECYSTECTOMY  1972  . COLONOSCOPY    . EYE SURGERY Bilateral   . Laser vein procedure    . REFRACTIVE SURGERY    . TONSILLECTOMY     Review of Systems   10 point systems review negative except as above.    Objective:   Physical Exam  BP 132/64   Pulse 80   Temp (!) 97.2 F (36.2 C)   Resp 18   Ht 5\' 3"  (1.6 m)   Wt 207 lb (93.9 kg)   BMI 36.67 kg/m   HEENT - WNL. Neck - supple. No JVD.  Car 2+/2+ Chest - few forced post tussive end expiratory wheezes not clearing w/ cough  Cor - soft HS. Sl irreg RR w/o sig M PP 1(+). No edema. MS- FROM w/o deformities.  Gait Nl. Neuro -  Nl w/o focal abnormalities. Skin - 3" erythematous wheal/flare around the alleged tick site with ? Central clearing.     Assessment & Plan:   1. Tick bite, initial encounter  - dexamethasone (DECADRON) 0.5 MG tablet; Take 1 tab 3 x day - 3 days, then 2 x day - 3 days, then 1 tab daily  Dispense: 20 tablet; Refill: 0 - doxycycline (VIBRAMYCIN) 100 MG capsule; Take 1 capsule 2 x / day with food for infection  Dispense: 30 capsule; Refill: 0 - B. burgdorfi antibodies  2. Cellulitis of left upper extremity  - doxycycline (VIBRAMYCIN) 100 MG capsule; Take 1 capsule 2 x / day with food for infection  Dispense: 30 capsule; Refill: 0 - CBC with Differential/Platelet  3. Medication management  - BASIC METABOLIC PANEL WITH GFR  4. Mild intermittent asthma without complication  - dexamethasone (DECADRON) 0.5 MG tablet; Take 1 tab 3 x day - 3 days, then 2 x day - 3 days, then 1 tab daily  Dispense: 20 tablet; Refill: 0  - albuterol (PROAIR HFA) 108 (90 Base) MCG/ACT inhaler; 2 inhalations 10-15 minutes apart evert 4 hours as needed for Asthma Flare  Dispense: 48 g; Refill: 3

## 2017-07-21 LAB — CBC WITH DIFFERENTIAL/PLATELET
BASOS ABS: 38 {cells}/uL (ref 0–200)
Basophils Relative: 0.6 %
Eosinophils Absolute: 179 cells/uL (ref 15–500)
Eosinophils Relative: 2.8 %
HEMATOCRIT: 42 % (ref 35.0–45.0)
Hemoglobin: 14.3 g/dL (ref 11.7–15.5)
LYMPHS ABS: 1626 {cells}/uL (ref 850–3900)
MCH: 30.4 pg (ref 27.0–33.0)
MCHC: 34 g/dL (ref 32.0–36.0)
MCV: 89.2 fL (ref 80.0–100.0)
MPV: 10.4 fL (ref 7.5–12.5)
Monocytes Relative: 6 %
NEUTROS PCT: 65.2 %
Neutro Abs: 4173 cells/uL (ref 1500–7800)
PLATELETS: 272 10*3/uL (ref 140–400)
RBC: 4.71 10*6/uL (ref 3.80–5.10)
RDW: 14.7 % (ref 11.0–15.0)
TOTAL LYMPHOCYTE: 25.4 %
WBC mixed population: 384 cells/uL (ref 200–950)
WBC: 6.4 10*3/uL (ref 3.8–10.8)

## 2017-07-21 LAB — BASIC METABOLIC PANEL WITH GFR
BUN/Creatinine Ratio: 9 (calc) (ref 6–22)
BUN: 12 mg/dL (ref 7–25)
CHLORIDE: 100 mmol/L (ref 98–110)
CO2: 29 mmol/L (ref 20–32)
Calcium: 10 mg/dL (ref 8.6–10.4)
Creat: 1.3 mg/dL — ABNORMAL HIGH (ref 0.60–0.93)
GFR, EST AFRICAN AMERICAN: 46 mL/min/{1.73_m2} — AB (ref >=60)
GFR, Est Non African American: 40 mL/min/{1.73_m2} — ABNORMAL LOW (ref >=60)
GLUCOSE: 144 mg/dL — AB (ref 65–99)
POTASSIUM: 4.4 mmol/L (ref 3.5–5.3)
Sodium: 138 mmol/L (ref 135–146)

## 2017-07-21 LAB — B. BURGDORFI ANTIBODIES

## 2017-07-22 ENCOUNTER — Other Ambulatory Visit: Payer: Medicare Other

## 2017-07-23 ENCOUNTER — Other Ambulatory Visit: Payer: Self-pay | Admitting: Internal Medicine

## 2017-07-25 DIAGNOSIS — H1131 Conjunctival hemorrhage, right eye: Secondary | ICD-10-CM | POA: Diagnosis not present

## 2017-07-25 DIAGNOSIS — H01021 Squamous blepharitis right upper eyelid: Secondary | ICD-10-CM | POA: Diagnosis not present

## 2017-07-25 DIAGNOSIS — H10411 Chronic giant papillary conjunctivitis, right eye: Secondary | ICD-10-CM | POA: Diagnosis not present

## 2017-07-25 DIAGNOSIS — H10211 Acute toxic conjunctivitis, right eye: Secondary | ICD-10-CM | POA: Diagnosis not present

## 2017-07-25 DIAGNOSIS — L718 Other rosacea: Secondary | ICD-10-CM | POA: Diagnosis not present

## 2017-07-29 ENCOUNTER — Other Ambulatory Visit: Payer: Self-pay | Admitting: Internal Medicine

## 2017-08-03 ENCOUNTER — Telehealth: Payer: Self-pay | Admitting: *Deleted

## 2017-08-03 NOTE — Telephone Encounter (Signed)
Patient called and states her legs are very swollen.  She is taking Lasix 80 mg in the AM and was advised per Dr Melford Aase, to take 80 mg in the PM. He also advised her to wear compression hose/ support hose and elevate her legs.  Pastient is aware.

## 2017-08-04 ENCOUNTER — Telehealth: Payer: Self-pay | Admitting: *Deleted

## 2017-08-04 NOTE — Telephone Encounter (Signed)
Patient called and asked if she can take Furosemide 80 mg twice a day, until her edema resolves. Per Dr Melford Aase, it is OK to continue, but she needs to weigh daily. The patient states she already weighs daily and has started wearing support hose.

## 2017-08-10 NOTE — Progress Notes (Signed)
MEDICARE ANNUAL WELLNESS VISIT AND FOLLOW UP  Assessment:   Diagnoses and all orders for this visit:  Encounter for Medicare annual wellness exam  PVD (peripheral vascular disease) with claudication ([place]) Control blood pressure, cholesterol, glucose, increase exercise as tolerated Followed by cardiology and vascular  PAH (pulmonary artery hypertension) ([place]) Continue medications, followed by cards and pulm  Migraine without status migrainosus, not intractable, unspecified migraine type Followed by neurology   Essential hypertension Continue medication Monitor blood pressure at home; call if consistently over 130/80 Continue DASH diet.   Reminder to go to the ER if any CP, SOB, nausea, dizziness, severe HA, changes vision/speech, left arm numbness and tingling and jaw pain.  Atherosclerosis of native coronary artery of native heart without angina pectoris Control blood pressure, cholesterol, glucose, increase exercise.   Chronic combined systolic and diastolic congestive heart failure (HCC) Weights stable; at baseline - however having edema even with increase of lasix to 160 mg daily, compression therapy and extremity elevation Continue diuretic, recheck BMP/GFR today She is   Chronic atrial fibrillation ([place]) Rate controlled; followed by cardiology Check INR and will adjust medication according to labs.  Discussed if patient falls to immediately contact office or go to ER. Discussed foods that can increase or decrease Coumadin levels. Patient understands to call the office before starting a new medication. Follow up in one month.   AAA (abdominal aortic aneurysm) without rupture (HCC) S/p repair; followed by vascular  Pulmonary Fibrosis sequellae of Amiodarone No longer on amiodarone Followed by pulmonology  COPD (chronic obstructive pulmonary disease) with chronic bronchitis (HCC) Continue inhalers, followed by pulmonology  Gastroesophageal reflux disease without  esophagitis Well managed on current medications Discussed diet, avoiding triggers and other lifestyle changes  Esophageal dysphagia Chew slowly, avoid distracted eating  Hypothyroidism, unspecified type continue medications the same pending lab results reminded to take on an empty stomach 30-23mins before food.  check TSH level  Osteopenia, unspecified location - get dexa, continue Vit D and Ca, weight bearing exercises  CKD  stage III (GFR 51 ml/min) Increase fluids, avoid NSAIDS, monitor sugars, will monitor BMP/GFR  Vitamin D deficiency At goal at recent check; continue to recommend supplementation for goal of 70-100 Defer vitamin D level  Other abnormal glucose Discussed disease and risks Discussed diet/exercise, weight management  A1C, insulin levels regularly  Morbid obesity due to excess calories ([place]) Long discussion about weight loss, diet, and exercise Recommended diet heavy in fruits and veggies and low in animal meats, cheeses, and dairy products, appropriate calorie intake Discussed appropriate weight for height  Follow up at next visit  Medication management CBC, BMP/GFR  Long term current use of anticoagulant therapy Check INR and will adjust medication according to labs.  Discussed if patient falls to immediately contact office or go to ER. Discussed foods that can increase or decrease Coumadin levels. Patient understands to call the office before starting a new medication. Follow up in one month.   Hyperlipidemia Continue medications Continue low cholesterol diet and exercise.  Check lipid panel q3 mo  Gout, unspecified cause, unspecified chronicity, unspecified site Continue allopurinol Diet discussed Check uric acid as needed  Need for tetanus booster Td   Over 40 minutes of exam, counseling, chart review and critical decision making was performed Future Appointments  Date Time Provider [place]  09/13/2017 11:30 AM Liane Comber,  NP [facility] None  10/10/2017 10:45 AM Tanda Rockers, MD [facility] None  10/18/2017 10:30 AM [name], MD [facility] None  11/07/2017 10:00 AM Evans Lance, MD [facility]  02/02/2018  9:00 AM [name], MD [facility] None     Plan:   During the course of the visit the patient was educated and counseled about appropriate screening and preventive services including:    Pneumococcal vaccine   Prevnar 13  Influenza vaccine  Td vaccine  Screening electrocardiogram  Bone densitometry screening  Colorectal cancer screening  Diabetes screening  Glaucoma screening  Nutrition counseling   Advanced directives: requested   Subjective:  Katie [name] is a 77 y.o. female who presents for Medicare Annual Wellness Visit and 1 month follow up. Patient has hx/o Chronic Afib since 1999 & has been on Coumadin since. 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 In 2014, Lexiscan showed EF 50%. She had an endovascular graft placed in 2015 and is followed by vascular. 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   BMI is Body mass index is 36.67 kg/m., she has been working on diet and exercise.  Her blood pressure has been controlled at home, today their BP is BP: 110/64   She has a history of Combined Systolic and Diastolic, denies orthopnea and paroxysmal nocturnal dyspnea. Positive for dyspnea and lower extremity edema despite recent increase of lasix to 160 mg daily. Wt Readings from Last 3 Encounters:  08/11/17 207 lb (93.9 kg)  07/20/17 207 lb (93.9 kg)  07/11/17 202 lb (91.6 kg)   Patient is on Coumadin for Encounter for Medicare annual wellness exam [Z00.00] Patient's last INR is  Lab Results  Component Value Date   INR 1.4 (H) 07/05/2017   INR 2.2 (H) 06/21/2017   INR 1.4 (H) 06/09/2017    Patient denies SOB, CP, dizziness, nose bleeds,  easy bleeding, and blood in stool/urine. Her coumadin dose was changed last visit. She has not taken ABX, has missed doses but denies a fall.    Coumadin: was supposed to increase to 4 mg 2 x/ wk on T Th & 3 mg the other 5 da/wk                    She took 2 mg 2x / week on T TH & 3 mg the other 5 days  Medication Review: Current Outpatient Medications on File Prior to Visit  Medication Sig Dispense Refill  . furosemide (LASIX) 80 MG tablet Take 80 mg in the AM.  Take 40 mg .5 tablet at lunch every other day. (Patient taking differently: 80 mg 2 (two) times daily. ) 120 tablet 3  . ipratropium (ATROVENT) 0.02 % nebulizer solution Take 2.5 mLs (0.5 mg total) by nebulization every 4 (four) hours as needed for wheezing or shortness of breath. 75 mL 2  . ipratropium (ATROVENT) 0.06 % nasal spray Use 1 to 2 sprays each Nostril 2 to 3 x / day as needed 45 mL 3  . acetaminophen (TYLENOL) 500 MG tablet Take 1,000 mg by mouth daily.     Marland Kitchen albuterol (PROAIR HFA) 108 (90 Base) MCG/ACT inhaler 2 inhalations 10-15 minutes apart evert 4 hours as needed for Asthma Flare 48 g 3  . allopurinol (ZYLOPRIM) 300 MG tablet Take 300 mg by mouth daily.    Marland Kitchen aspirin EC 81 MG tablet Take 81 mg by mouth daily.    . Cholecalciferol (VITAMIN D3) 5000 units CAPS Takes 2 caps (10,000 units) daily (Patient taking differently: Take 5,000 Units by mouth daily. )    .  dexamethasone (DECADRON) 0.5 MG tablet Take 1 tab 3 x day - 3 days, then 2 x day - 3 days, then 1 tab daily (Patient not taking: Reported on 08/11/2017) 20 tablet 0  . digoxin (LANOXIN) 0.125 MG tablet Take 1 tablet daily for Heart Rhythm (Patient taking differently: Take 0.125 mg by mouth daily. Take 1 tablet daily for Heart Rhythm) 90 tablet 3  . diltiazem (TIAZAC) 120 MG 24 hr capsule TAKE 1 CAPSULE BY MOUTH EVERY DAY 90 capsule 1  . doxycycline (VIBRAMYCIN) 100 MG capsule Take 1 capsule 2 x / day with food for infection (Patient not taking: Reported on 08/11/2017)  30 capsule 0  . Fluticasone-Umeclidin-Vilant (TRELEGY ELLIPTA) 100-62.5-25 MCG/INH AEPB Inhale 1 puff into the lungs daily. 60 each 3  . gabapentin (NEURONTIN) 600 MG tablet Takes 1/2 to 1 tablet 3 times daily. 270 tablet 1  . levalbuterol (XOPENEX HFA) 45 MCG/ACT inhaler Inhale 2 puffs into the lungs every 4 (four) hours as needed for wheezing. 1 Inhaler 3  . levothyroxine (SYNTHROID, LEVOTHROID) 50 MCG tablet Take 1 & 1/2 tablets daily or as directed 90 tablet 1  . montelukast (SINGULAIR) 10 MG tablet TAKE 1 TABLET EVERY DAY FOR ALLERGIES 90 tablet 1  . Olopatadine HCl 0.2 % SOLN Place 1 drop into both eyes daily.     . pantoprazole (PROTONIX) 40 MG tablet Take 1 tablet (40 mg total) by mouth daily.    [name] Glycol-Propyl Glycol (SYSTANE OP) Apply 1 drop to eye 4 (four) times daily.     . potassium chloride SA (KLOR-CON M20) 20 MEQ tablet TAKE 2 TABLETS (40 MEQ TOTAL) BY MOUTH DAILY. (Patient taking differently: Take 40 mEq by mouth daily. ) 180 tablet 1  . pravastatin (PRAVACHOL) 40 MG tablet TAKE 1/2 TABLET BY MOUTH AT BEDTIME FOR CHOLESTEROL 90 tablet 1  . prednisoLONE acetate (PRED FORTE) 1 % ophthalmic suspension INSTILL 1 DROP INTO BOTH EYES FOUR TIMES A DAY FOR 10 DAYS AND THEN TWICE DAY.  1  . prochlorperazine (COMPAZINE) 5 MG tablet TAKE 1 TABLET BY MOUTH THREE TIMES A DAY AS NEEDED FOR VERTIGO OR NAUSEA 60 tablet 0  . triamcinolone cream (KENALOG) 0.1 % Apply 1 application topically at bedtime.    Marland Kitchen warfarin (COUMADIN) 3 MG tablet TAKE 1-2 TABLETS DAILY AS DIRECTED (Patient taking differently: 2 mg. TAKE 1-2 TABLETS DAILY AS DIRECTED) 180 tablet 1   No current facility-administered medications on file prior to visit.     Allergies  Allergen Reactions  . Amiodarone Other (See Comments)    PULMONARY TOXICITY  . Diovan [Valsartan] Other (See Comments)    HYPOTENSION  . Doxycycline Diarrhea and Other (See Comments)    VISUAL DISTURBANCE  . Flexeril [Cyclobenzaprine] Other  (See Comments)    FATIGUE  . Keflex [Cephalexin] Diarrhea  . Verapamil Other (See Comments)    EDEMA  . Codeine Other (See Comments)    unknown    Current Problems (verified) Patient Active Problem List   Diagnosis Date Noted  . PAH (pulmonary artery hypertension) ([place]) 06/23/2017  . Esophageal dysphagia 05/15/2017  . Gout 03/20/2017  . Other abnormal glucose 09/19/2015  . AAA (abdominal aortic aneurysm) without rupture ([place]) 04/22/2015  . Morbid obesity due to excess calories ([place]) 08/03/2014  . PVD (peripheral vascular disease) with claudication ([place]) 10/16/2013  . CKD  stage III (GFR 51 ml/min) 06/08/2013  . Hyperlipidemia 06/08/2013  . Medication management 06/08/2013  . 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  Long term current use of anticoagulant therapy 04/23/2013  . Vitamin D deficiency 02/15/2013  . Hypothyroidism   . Osteopenia   . Congestive heart failure ([place]) 11/25/2008  . Migraine headache 11/22/2008  . Essential hypertension 11/22/2008  . Coronary atherosclerosis 11/22/2008  . Atrial fibrillation ([place]) 11/22/2008  . COPD (chronic obstructive pulmonary disease) with chronic bronchitis ([place]) 11/22/2008  . GERD 11/22/2008    Screening Tests Immunization History  Administered Date(s) Administered  . Influenza, High Dose Seasonal PF 12/20/2013, 12/09/2014, 11/14/2015, 01/10/2017  . Influenza-Unspecified 01/02/2013  . PPD Test 07/17/2016  . Pneumococcal Conjugate-13 01/23/2014  . Pneumococcal-Unspecified 03/15/1993, 05/31/2008  . Td 03/16/2007  . Varicella 02/19/2008   Preventative care: Last colonoscopy: Normal in 2014, declines any further colonoscopy Last mammogram: 09/2016  DEXA: 2017, due 09/2017 PFTs 02/2016 MRI 2018 Stress test 2014 Echo 2019  Prior vaccinations: TD or Tdap: 2009 DUE   Influenza: 2018  Pneumococcal: 2010 Prevnar13: 2015 Shingles/Zostavax: 2009  Names of Other Physician/Practitioners you currently use: 1.  [facility]- here for primary care 2. Dr. Patrice Paradise, eye doctor, last visit  2019 3. Dentures, dentist, last visit remotely   Patient Care Team: [name], MD as PCP - General (Internal Medicine) Evans Lance, MD as Consulting Physician (Cardiology) Georgeann Oppenheim, MD as Consulting Physician (Ophthalmology) Terrance Mass, MD as Consulting Physician (Gynecology)  SURGICAL HISTORY She  has a past surgical history that includes broken leg (Left, 1970s); Laser vein procedure; Refractive surgery; Colonoscopy; Cholecystectomy (1972); Tonsillectomy; Eye surgery (Bilateral); Cardiac catheterization; Abdominal aortic endovascular stent graft (N/A, 04/11/2013); and Breast surgery. FAMILY HISTORY Her family history includes Alcohol abuse in her father; Breast cancer in her sister; Cancer (age of onset: 77) in her mother; Cirrhosis in her mother; Depression in her father; Heart defect in her sister; Heart disease in her daughter and sister; Hyperlipidemia in her son; Hypertension in her brother; Stroke in her sister. SOCIAL HISTORY She  reports that she quit smoking about 15 years ago. Her smoking use included cigarettes. She has never used smokeless tobacco. She reports that she drinks alcohol. She reports that she does not use drugs.   MEDICARE WELLNESS OBJECTIVES: Physical activity: Current Exercise Habits: The patient does not participate in regular exercise at present, Exercise limited by: respiratory conditions(s);cardiac condition(s) Cardiac risk factors: Cardiac Risk Factors include: advanced age (>60men, >46 women);obesity (BMI >30kg/m2);dyslipidemia;hypertension;sedentary lifestyle;smoking/ tobacco exposure Depression/mood screen:   Depression screen [facility] 2/9 08/11/2017  Decreased Interest 0  Down, Depressed, Hopeless 0  PHQ - 2 Score 0  Some recent data might be hidden    ADLs:  In your present state of health, do you have any difficulty  performing the following activities: 08/11/2017 07/05/2017  Hearing? N N  Vision? N N  Difficulty concentrating or making decisions? N N  Walking or climbing stairs? N N  Comment Gets short of breath, but can still do -  Dressing or bathing? N N  Doing errands, shopping? N N  Preparing Food and eating ? N -  Using the Toilet? N -  In the past six months, have you accidently leaked urine? Y -  Comment Minor stress incontinence, wears a pad -  Do you have problems with loss of bowel control? N -  Managing your Medications? N -  Managing your Finances? N -  Housekeeping or managing your Housekeeping? N -  Some recent data might be hidden     Cognitive Testing  Alert? Yes  Normal Appearance?Yes  Oriented to person? Yes  Place? Yes   Time? Yes  Recall of three objects?  Yes  Can perform simple calculations? Yes  Displays appropriate judgment?Yes  Can read the correct time from a watch face?Yes  EOL planning: Does Patient Have a Medical Advance Directive?: No Would patient like information on creating a medical advance directive?: Yes (MAU/Ambulatory/Procedural Areas - Information given)  Review of Systems  Constitutional: Negative for malaise/fatigue and weight loss.  HENT: Negative for hearing loss and tinnitus.   Eyes: Negative for blurred vision and double vision.  Respiratory: Positive for shortness of breath (With exertion only, at baselin). Negative for cough, sputum production and wheezing.   Cardiovascular: Positive for leg swelling. Negative for chest pain, palpitations, orthopnea, claudication and PND.  Gastrointestinal: Negative for abdominal pain, blood in stool, constipation, diarrhea, heartburn, melena, nausea and vomiting.  Genitourinary: Negative.   Musculoskeletal: Negative for falls, joint pain and myalgias.  Skin: Negative for rash.  Neurological: Negative for dizziness, tingling, sensory change, weakness and headaches.  Endo/Heme/Allergies: Negative for  polydipsia.  Psychiatric/Behavioral: Negative.  Negative for depression, memory loss, substance abuse and suicidal ideas. The patient is not nervous/anxious and does not have insomnia.   All other systems reviewed and are negative.    Objective:     Today's Vitals   08/11/17 1417  BP: 110/64  Pulse: 65  Temp: 97.7 F (36.5 C)  SpO2: 97%  Weight: 207 lb (93.9 kg)  Height: 5\' 3"  (1.6 m)  PainSc: 3   PainLoc: Back   Body mass index is 36.67 kg/m.  General appearance: alert, no distress, WD/WN, female HEENT: normocephalic, sclerae anicteric, TMs pearly, nares patent, no discharge or erythema, pharynx normal Oral cavity: MMM, no lesions Neck: supple, no lymphadenopathy, no thyromegaly, no masses Heart: RRR, normal S1, T7,7/1 blowing systolic murmur Lungs: CTA bilaterally, no wheezes, rhonchi, or rales Abdomen: +bs, soft, non tender, non distended, no masses, no hepatomegaly, no splenomegaly Musculoskeletal: nontender, no swelling, no obvious deformity Extremities:  no cyanosis, no clubbing, has compression hose on bilaterally - some edema evident bilateral lower extremities Pulses: 2+ symmetric, upper and lower extremities, normal cap refill Neurological: alert, oriented x 3, CN2-12 intact, strength normal upper extremities and lower extremities, sensation normal throughout, DTRs 2+ throughout, no cerebellar signs, gait normal Psychiatric: normal affect, behavior normal, pleasant   Medicare Attestation I have personally reviewed: The patient's medical and social history Their use of alcohol, tobacco or illicit drugs Their current medications and supplements The patient's functional ability including ADLs,fall risks, home safety risks, cognitive, and hearing and visual impairment Diet and physical activities Evidence for depression or mood disorders  The patient's weight, height, BMI, and visual acuity have been recorded in the chart.  I have made referrals, counseling, and  provided education to the patient based on review of the above and I have provided the patient with a written personalized care plan for preventive services.     Izora Ribas, NP   08/11/2017

## 2017-08-11 ENCOUNTER — Ambulatory Visit: Payer: Self-pay | Admitting: Adult Health

## 2017-08-11 ENCOUNTER — Encounter: Payer: Self-pay | Admitting: Adult Health

## 2017-08-11 ENCOUNTER — Ambulatory Visit (INDEPENDENT_AMBULATORY_CARE_PROVIDER_SITE_OTHER): Payer: Medicare Other | Admitting: Adult Health

## 2017-08-11 VITALS — BP 110/64 | HR 65 | Temp 97.7°F | Ht 63.0 in | Wt 207.0 lb

## 2017-08-11 DIAGNOSIS — Z23 Encounter for immunization: Secondary | ICD-10-CM

## 2017-08-11 DIAGNOSIS — E782 Mixed hyperlipidemia: Secondary | ICD-10-CM

## 2017-08-11 DIAGNOSIS — I2510 Atherosclerotic heart disease of native coronary artery without angina pectoris: Secondary | ICD-10-CM | POA: Diagnosis not present

## 2017-08-11 DIAGNOSIS — I714 Abdominal aortic aneurysm, without rupture, unspecified: Secondary | ICD-10-CM

## 2017-08-11 DIAGNOSIS — G43909 Migraine, unspecified, not intractable, without status migrainosus: Secondary | ICD-10-CM | POA: Diagnosis not present

## 2017-08-11 DIAGNOSIS — E039 Hypothyroidism, unspecified: Secondary | ICD-10-CM

## 2017-08-11 DIAGNOSIS — Z79899 Other long term (current) drug therapy: Secondary | ICD-10-CM

## 2017-08-11 DIAGNOSIS — N183 Chronic kidney disease, stage 3 unspecified: Secondary | ICD-10-CM

## 2017-08-11 DIAGNOSIS — R1310 Dysphagia, unspecified: Secondary | ICD-10-CM | POA: Diagnosis not present

## 2017-08-11 DIAGNOSIS — R6889 Other general symptoms and signs: Secondary | ICD-10-CM | POA: Diagnosis not present

## 2017-08-11 DIAGNOSIS — I482 Chronic atrial fibrillation, unspecified: Secondary | ICD-10-CM

## 2017-08-11 DIAGNOSIS — I739 Peripheral vascular disease, unspecified: Secondary | ICD-10-CM

## 2017-08-11 DIAGNOSIS — Z0001 Encounter for general adult medical examination with abnormal findings: Secondary | ICD-10-CM

## 2017-08-11 DIAGNOSIS — I10 Essential (primary) hypertension: Secondary | ICD-10-CM

## 2017-08-11 DIAGNOSIS — I5042 Chronic combined systolic (congestive) and diastolic (congestive) heart failure: Secondary | ICD-10-CM | POA: Diagnosis not present

## 2017-08-11 DIAGNOSIS — K219 Gastro-esophageal reflux disease without esophagitis: Secondary | ICD-10-CM

## 2017-08-11 DIAGNOSIS — R7309 Other abnormal glucose: Secondary | ICD-10-CM

## 2017-08-11 DIAGNOSIS — M8580 Other specified disorders of bone density and structure, unspecified site: Secondary | ICD-10-CM

## 2017-08-11 DIAGNOSIS — J449 Chronic obstructive pulmonary disease, unspecified: Secondary | ICD-10-CM | POA: Diagnosis not present

## 2017-08-11 DIAGNOSIS — J8410 Pulmonary fibrosis, unspecified: Secondary | ICD-10-CM | POA: Diagnosis not present

## 2017-08-11 DIAGNOSIS — M109 Gout, unspecified: Secondary | ICD-10-CM

## 2017-08-11 DIAGNOSIS — E559 Vitamin D deficiency, unspecified: Secondary | ICD-10-CM

## 2017-08-11 DIAGNOSIS — I2721 Secondary pulmonary arterial hypertension: Secondary | ICD-10-CM

## 2017-08-11 DIAGNOSIS — Z0000 Encounter for general adult medical examination without abnormal findings: Secondary | ICD-10-CM

## 2017-08-11 DIAGNOSIS — Z7901 Long term (current) use of anticoagulants: Secondary | ICD-10-CM

## 2017-08-11 DIAGNOSIS — R1319 Other dysphagia: Secondary | ICD-10-CM

## 2017-08-11 NOTE — Patient Instructions (Addendum)
Coumadin: 4 mg (take 2 of the 2 mg) on Tuesday and Thursday and 3 mg other 5 days   Use a dropper or use a cap to put peroxide, olive oil,mineral oil or canola oil in the effected ear- 2-3 times a week. Let it soak for 20-30 min then you can take a shower or use a baby bulb with warm water to wash out the ear wax.  Do not use Qtips    Aim for 7+ servings of fruits and vegetables daily  80+ fluid ounces of water or unsweet tea for healthy kidneys  Avoid alcohol  Limit animal fats in diet for cholesterol and heart health - choose grass fed whenever available  Aim for low stress - take time to unwind and care for your mental health  Aim for 150 min of moderate intensity exercise weekly for heart health, and weights twice weekly for bone health  Aim for 7-9 hours of sleep daily      When it comes to diets, agreement about the perfect plan isn't easy to find, even among the experts. Experts at the [employer] developed an idea known as the Healthy Eating Plate. Just imagine a plate divided into logical, healthy portions.  The emphasis is on diet quality:  Load up on vegetables and fruits - one-half of your plate: Aim for color and variety, and remember that potatoes don't count.  Go for whole grains - one-quarter of your plate: Whole wheat, barley, wheat berries, quinoa, oats, brown rice, and foods made with them. If you want pasta, go with whole wheat pasta.  Protein power - one-quarter of your plate: Fish, chicken, beans, and nuts are all healthy, versatile protein sources. Limit red meat.  The diet, however, does go beyond the plate, offering a few other suggestions.  Use healthy plant oils, such as olive, canola, soy, corn, sunflower and peanut. Check the labels, and avoid partially hydrogenated oil, which have unhealthy trans fats.  If you're thirsty, drink water. Coffee and tea are good in moderation, but skip sugary drinks and limit milk and dairy  products to one or two daily servings.  The type of carbohydrate in the diet is more important than the amount. Some sources of carbohydrates, such as vegetables, fruits, whole grains, and beans-are healthier than others.  Finally, stay active.

## 2017-08-12 ENCOUNTER — Telehealth: Payer: Self-pay | Admitting: Internal Medicine

## 2017-08-12 DIAGNOSIS — I5042 Chronic combined systolic (congestive) and diastolic (congestive) heart failure: Secondary | ICD-10-CM

## 2017-08-12 LAB — CBC WITH DIFFERENTIAL/PLATELET
BASOS ABS: 42 {cells}/uL (ref 0–200)
Basophils Relative: 0.6 %
EOS ABS: 161 {cells}/uL (ref 15–500)
Eosinophils Relative: 2.3 %
HCT: 43.8 % (ref 35.0–45.0)
Hemoglobin: 15 g/dL (ref 11.7–15.5)
Lymphs Abs: 2184 cells/uL (ref 850–3900)
MCH: 30.5 pg (ref 27.0–33.0)
MCHC: 34.2 g/dL (ref 32.0–36.0)
MCV: 89.2 fL (ref 80.0–100.0)
MONOS PCT: 9.7 %
MPV: 11 fL (ref 7.5–12.5)
Neutro Abs: 3934 cells/uL (ref 1500–7800)
Neutrophils Relative %: 56.2 %
Platelets: 212 10*3/uL (ref 140–400)
RBC: 4.91 10*6/uL (ref 3.80–5.10)
RDW: 14.3 % (ref 11.0–15.0)
Total Lymphocyte: 31.2 %
WBC mixed population: 679 cells/uL (ref 200–950)
WBC: 7 10*3/uL (ref 3.8–10.8)

## 2017-08-12 LAB — BASIC METABOLIC PANEL WITH GFR
BUN / CREAT RATIO: 14 (calc) (ref 6–22)
BUN: 18 mg/dL (ref 7–25)
CHLORIDE: 103 mmol/L (ref 98–110)
CO2: 24 mmol/L (ref 20–32)
Calcium: 9.8 mg/dL (ref 8.6–10.4)
Creat: 1.28 mg/dL — ABNORMAL HIGH (ref 0.60–0.93)
GFR, Est African American: 47 mL/min/{1.73_m2} — ABNORMAL LOW (ref >=60)
GFR, Est Non African American: 41 mL/min/{1.73_m2} — ABNORMAL LOW (ref >=60)
GLUCOSE: 87 mg/dL (ref 65–99)
POTASSIUM: 4.3 mmol/L (ref 3.5–5.3)
SODIUM: 140 mmol/L (ref 135–146)

## 2017-08-12 LAB — PROTIME-INR
INR: 2.9 — AB
PROTHROMBIN TIME: 30.3 s — AB (ref 9.0–11.5)

## 2017-08-12 MED ORDER — TORSEMIDE 20 MG PO TABS: 40.0000 mg | ORAL_TABLET | Freq: Every day | ORAL | 11 refills | Status: DC

## 2017-08-12 NOTE — Telephone Encounter (Signed)
Call placed to Pt. Per Dr. Leona Singleton furosemide.  Start torsemide 40 mg bid. Return in one week for bmp. Pt indicates understanding.  Will follow.

## 2017-08-12 NOTE — Telephone Encounter (Signed)
Follow up    Pt c/o medication issue:  1. Name of [place]  2. How are you currently taking this medication (dosage and times per day)?   3. Are you having a reaction (difficulty breathing--STAT)?   4. What is your medication issue? Patient is calling back to clarify whether or not she should still be taking her furosemide or how she should be taking it. Please call.

## 2017-08-12 NOTE — Telephone Encounter (Signed)
Pt calling  Pt c/o swelling: STAT is pt has developed SOB within 24 hours  1) How much weight have you gained and in what time span? 6 lbs in 3weeks  If swelling, where is the swelling located? Ankles and feet  2) Are you currently taking a fluid pill? Yes  3) Are you currently SOB? No  4) Do you have a log of your daily weights (if so, list)? No  5) Have you gained 3 pounds in a day or 5 pounds in a week? No  6) Have you traveled recently? No

## 2017-08-12 NOTE — Telephone Encounter (Signed)
Returned call to Pt. Pt has had increased edema to her feet and ankles-we estimate she has probably gained 3 pounds over the last month. Pt states she has been using compression hose, trying to avoid salt. Pt is currently taking furosemide 80 mg bid.  Increased furosemide does not seem to be helping. Advised will discuss with Dr. Lovena Le.  Will let Pt know if any recommendations and will set up follow up appt based on recommendations.  Pt indicates understanding. Will cont to monitor.

## 2017-08-16 ENCOUNTER — Other Ambulatory Visit: Payer: Self-pay | Admitting: Internal Medicine

## 2017-08-17 ENCOUNTER — Other Ambulatory Visit: Payer: Self-pay | Admitting: Internal Medicine

## 2017-08-19 ENCOUNTER — Other Ambulatory Visit: Payer: Medicare Other

## 2017-08-30 ENCOUNTER — Encounter: Payer: Self-pay | Admitting: Adult Health

## 2017-08-30 ENCOUNTER — Ambulatory Visit (INDEPENDENT_AMBULATORY_CARE_PROVIDER_SITE_OTHER): Payer: Medicare Other | Admitting: Adult Health

## 2017-08-30 VITALS — BP 110/62 | HR 70 | Temp 97.5°F | Ht 63.0 in | Wt 203.0 lb

## 2017-08-30 DIAGNOSIS — J440 Chronic obstructive pulmonary disease with acute lower respiratory infection: Secondary | ICD-10-CM

## 2017-08-30 DIAGNOSIS — J209 Acute bronchitis, unspecified: Secondary | ICD-10-CM | POA: Diagnosis not present

## 2017-08-30 MED ORDER — AZITHROMYCIN 250 MG PO TABS: ORAL_TABLET | ORAL | 1 refills | Status: AC

## 2017-08-30 MED ORDER — PREDNISONE 10 MG PO TABS: ORAL_TABLET | ORAL | 0 refills | Status: DC

## 2017-08-30 MED ORDER — PROMETHAZINE-DM 6.25-15 MG/5ML PO SYRP: 5.0000 mL | ORAL_SOLUTION | Freq: Four times a day (QID) | ORAL | 1 refills | Status: DC | PRN

## 2017-08-30 NOTE — Patient Instructions (Signed)

## 2017-08-30 NOTE — Progress Notes (Signed)
Assessment and Plan:  Katie [name] was seen today for uri.  Diagnoses and all orders for this visit:  Acute bronchitis with COPD ([place]) Add guaifenasin, steam baths to help loosen secretions ER for any worsening of dyspnea Keep close follow up in 5 days Decrease coumadin from 3 mg all days except 2 mg on Tues/Thurs to 2 mg every day until Monday Bring all respiratory meds with her on Monday for review - doesn't appear up to date and patient unsure  -     predniSONE (DELTASONE) 10 MG tablet; Take 4 tablets for 3 days; Take 3 tablets for 4 days; Take 2 tablets for 3 days; Take 1 tablet for 4 days -     azithromycin (ZITHROMAX) 250 MG tablet; Take 2 tablets (500 mg) on  Day 1,  followed by 1 tablet (250 mg) once daily on Days 2 through 5. -     promethazine-dextromethorphan (PROMETHAZINE-DM) 6.25-15 MG/5ML syrup; Take 5 mLs by mouth 4 (four) times daily as needed for cough.  Further disposition pending results of labs. Discussed med's effects and SE's.   Over 15 minutes of exam, counseling, chart review, and critical decision making was performed.   Future Appointments  Date Time Provider [place]  09/05/2017 10:30 AM Evans Lance, MD [facility]  09/13/2017 11:30 AM Liane Comber, NP [facility] None  10/10/2017 10:45 AM Tanda Rockers, MD [facility] None  10/18/2017 10:30 AM [name], MD [facility] None  02/02/2018  9:00 AM [name], MD [facility] None  08/23/2018  2:00 PM Liane Comber, NP [facility] None    ------------------------------------------------------------------------------------------------------------------   HPI BP 110/62   Pulse 70   Temp (!) 97.5 F (36.4 C)   Ht 5\' 3"  (1.6 m)   Wt 203 lb (92.1 kg)   SpO2 91%   BMI 35.96 kg/m   76 y.o.female with hx of pulmonary fibrosis, PAH, COPD, CHF presents for evaluation of cough and increased dyspnea x 3 days; she reports she woke up on Sunday with productive cough (very thick like  glue). She denies fever/chills, dizziness, n/v/d, chest pains/palpitation, edema, orthopnea, paroxysmal nocturnal dyspnea, though she does endorse she coughs less sitting up so has been getting up to sleep in recliner. Endorses runny nose, no congestion, ear pressure, facial pressure, headaches. O2 sats 96% on RA by provider recheck. No acute distress in office, does do pursed lip breathing consistent with her baseline.  She takes singulair, bevespi, has rescue inhalers/nebs as needed which she has used a few times with minimal improvement. She is followed by Dr. Alvina Filbert.   Cardiologist recently changed diuretic and has follow up scheduled for Monday.   She has been taking honey/lemon/whisky with water for cough (reports this has helped more than OTC cough medication).  Past Medical History:  Diagnosis Date  . Atrial fibrillation ([place])   . Migraine headache   . Osteopenia      Allergies  Allergen Reactions  . Amiodarone Other (See Comments)    PULMONARY TOXICITY  . Diovan [Valsartan] Other (See Comments)    HYPOTENSION  . Doxycycline Diarrhea and Other (See Comments)    VISUAL DISTURBANCE  . Flexeril [Cyclobenzaprine] Other (See Comments)    FATIGUE  . Keflex [Cephalexin] Diarrhea  . Verapamil Other (See Comments)    EDEMA  . Codeine Other (See Comments)    unknown    Current Outpatient Medications on File Prior to Visit  Medication Sig  . acetaminophen (TYLENOL) 500 MG tablet Take 1,000 mg by mouth  daily.   . albuterol (PROAIR HFA) 108 (90 Base) MCG/ACT inhaler 2 inhalations 10-15 minutes apart evert 4 hours as needed for Asthma Flare  . allopurinol (ZYLOPRIM) 300 MG tablet Take 300 mg by mouth daily.  Marland Kitchen aspirin EC 81 MG tablet Take 81 mg by mouth daily.  . Cholecalciferol (VITAMIN D3) 5000 units CAPS Takes 2 caps (10,000 units) daily (Patient taking differently: Take 5,000 Units by mouth daily. )  . digoxin (LANOXIN) 0.125 MG tablet Take 1 tablet daily for Heart Rhythm (Patient  taking differently: Take 0.125 mg by mouth daily. Take 1 tablet daily for Heart Rhythm)  . diltiazem (TIAZAC) 120 MG 24 hr capsule TAKE 1 CAPSULE BY MOUTH EVERY DAY  . Fluticasone-Umeclidin-Vilant (TRELEGY ELLIPTA) 100-62.5-25 MCG/INH AEPB Inhale 1 puff into the lungs daily.  Marland Kitchen gabapentin (NEURONTIN) 600 MG tablet Takes 1/2 to 1 tablet 3 times daily.  . Glycopyrrolate-Formoterol 9-4.8 MCG/ACT AERO Inhale 1 puff into the lungs daily.  Marland Kitchen ipratropium (ATROVENT) 0.02 % nebulizer solution Take 2.5 mLs (0.5 mg total) by nebulization every 4 (four) hours as needed for wheezing or shortness of breath.  Marland Kitchen ipratropium (ATROVENT) 0.06 % nasal spray Use 1 to 2 sprays each Nostril 2 to 3 x / day as needed  . levalbuterol (XOPENEX HFA) 45 MCG/ACT inhaler Inhale 2 puffs into the lungs every 4 (four) hours as needed for wheezing.  Marland Kitchen levothyroxine (SYNTHROID, LEVOTHROID) 50 MCG tablet Take 1 & 1/2 tablets daily or as directed  . montelukast (SINGULAIR) 10 MG tablet TAKE 1 TABLET EVERY DAY FOR ALLERGIES  . Olopatadine HCl 0.2 % SOLN Place 1 drop into both eyes daily.   . pantoprazole (PROTONIX) 40 MG tablet Take 1 tablet (40 mg total) by mouth daily.  [name] Glycol-Propyl Glycol (SYSTANE OP) Apply 1 drop to eye 4 (four) times daily.   . potassium chloride SA (KLOR-CON M20) 20 MEQ tablet TAKE 2 TABLETS (40 MEQ TOTAL) BY MOUTH DAILY. (Patient taking differently: Take 40 mEq by mouth daily. )  . pravastatin (PRAVACHOL) 40 MG tablet TAKE 1/2 TABLET BY MOUTH AT BEDTIME FOR CHOLESTEROL  . prednisoLONE acetate (PRED FORTE) 1 % ophthalmic suspension INSTILL 1 DROP INTO BOTH EYES FOUR TIMES A DAY FOR 10 DAYS AND THEN TWICE DAY.  Marland Kitchen prochlorperazine (COMPAZINE) 5 MG tablet TAKE 1 TABLET BY MOUTH THREE TIMES A DAY AS NEEDED FOR VERTIGO OR NAUSEA  . torsemide (DEMADEX) 20 MG tablet Take 2 tablets (40 mg total) by mouth daily.  Marland Kitchen triamcinolone cream (KENALOG) 0.1 % Apply 1 application topically at bedtime.  Marland Kitchen warfarin  (COUMADIN) 3 MG tablet TAKE 1-2 TABLETS DAILY AS DIRECTED (Patient taking differently: 2 mg. TAKE 1-2 TABLETS DAILY AS DIRECTED)  . allopurinol (ZYLOPRIM) 300 MG tablet TAKE 1 TABLET BY MOUTH EVERY DAY  . dexamethasone (DECADRON) 0.5 MG tablet Take 1 tab 3 x day - 3 days, then 2 x day - 3 days, then 1 tab daily (Patient not taking: Reported on 08/11/2017)  . doxycycline (VIBRAMYCIN) 100 MG capsule Take 1 capsule 2 x / day with food for infection (Patient not taking: Reported on 08/11/2017)   No current facility-administered medications on file prior to visit.     ROS: all negative except above.   Physical Exam:  BP 110/62   Pulse 70   Temp (!) 97.5 F (36.4 C)   Ht 5\' 3"  (1.6 m)   Wt 203 lb (92.1 kg)   SpO2 91%   BMI 35.96 kg/m  General Appearance: Well nourished, in no apparent distress. Eyes: PERRLA, EOMs, conjunctiva no swelling or erythema Sinuses: No Frontal/maxillary tenderness ENT/Mouth: Ext aud canals clear, TMs without erythema, bulging. No erythema, swelling, or exudate on post pharynx.  Tonsils not swollen or erythematous. Hearing normal.  Neck: Supple, thyroid normal.  Respiratory: Respiratory effort normal but with pursed lip breathing (baseline for this patient), BS present throughout though somewhat diminished in RLL, scant scattered rhonchi, without rales, wheezing or stridor.  Cardio: RRR with no MRGs. Intact peripheral pulses with bilateral scant edema of ankles Abdomen: Soft, + BS.  Non tender, no guarding, rebound, hernias, masses. Lymphatics: Non tender without lymphadenopathy.  Musculoskeletal: Symmetrical strength, normal gait.  Skin: Warm, dry without rashes, lesions, ecchymosis.  Psych: Awake and oriented X 3, normal affect, Insight and Judgment appropriate.     Izora Ribas, NP 9:56 AM Katie [name]

## 2017-08-31 ENCOUNTER — Encounter: Payer: Self-pay | Admitting: Internal Medicine

## 2017-08-31 ENCOUNTER — Other Ambulatory Visit: Payer: Self-pay | Admitting: Internal Medicine

## 2017-09-05 ENCOUNTER — Ambulatory Visit (INDEPENDENT_AMBULATORY_CARE_PROVIDER_SITE_OTHER): Payer: Medicare Other | Admitting: Physician Assistant

## 2017-09-05 ENCOUNTER — Encounter (INDEPENDENT_AMBULATORY_CARE_PROVIDER_SITE_OTHER): Payer: Self-pay | Admitting: Physician Assistant

## 2017-09-05 ENCOUNTER — Encounter: Payer: Self-pay | Admitting: Internal Medicine

## 2017-09-05 ENCOUNTER — Ambulatory Visit: Payer: Medicare Other | Admitting: Internal Medicine

## 2017-09-05 VITALS — Ht 63.0 in | Wt 203.0 lb

## 2017-09-05 VITALS — BP 144/66 | HR 88 | Ht 63.0 in | Wt 207.0 lb

## 2017-09-05 DIAGNOSIS — I10 Essential (primary) hypertension: Secondary | ICD-10-CM | POA: Diagnosis not present

## 2017-09-05 DIAGNOSIS — I5042 Chronic combined systolic (congestive) and diastolic (congestive) heart failure: Secondary | ICD-10-CM

## 2017-09-05 DIAGNOSIS — I482 Chronic atrial fibrillation: Secondary | ICD-10-CM

## 2017-09-05 DIAGNOSIS — M1711 Unilateral primary osteoarthritis, right knee: Secondary | ICD-10-CM

## 2017-09-05 DIAGNOSIS — I4821 Permanent atrial fibrillation: Secondary | ICD-10-CM

## 2017-09-05 DIAGNOSIS — M19172 Post-traumatic osteoarthritis, left ankle and foot: Secondary | ICD-10-CM

## 2017-09-05 MED ORDER — LIDOCAINE HCL 1 % IJ SOLN
2.0000 mL | INTRAMUSCULAR | Status: AC | PRN
  Administered 2017-09-05: 2 mL

## 2017-09-05 MED ORDER — HYALURONAN 88 MG/4ML IX SOSY
88.0000 mg | PREFILLED_SYRINGE | INTRA_ARTICULAR | Status: AC | PRN
  Administered 2017-09-05: 88 mg via INTRA_ARTICULAR

## 2017-09-05 MED ORDER — METHYLPREDNISOLONE ACETATE 40 MG/ML IJ SUSP
40.0000 mg | INTRAMUSCULAR | Status: AC | PRN
  Administered 2017-09-05: 40 mg via INTRA_ARTICULAR

## 2017-09-05 NOTE — Patient Instructions (Addendum)

## 2017-09-05 NOTE — Progress Notes (Signed)
HPI Katie [name] returns today for follow-up of her chronic dyspnea in the setting of chronic atrial fibrillation, long-standing pulmonary artery hypertension, mild left ventricular systolic function, and chronic lung disease with abnormal pulmonary function testing and CT scanning of the chest.  Her most recent DLCO was less than 50.  A CT scan of the chest demonstrated possible interstitial lung disease though not thought to be typical pulmonary fibrosis. When I saw her last we switched her to toresemide. Her dyspnea is better although still present.  Allergies  Allergen Reactions  . Amiodarone Other (See Comments)    PULMONARY TOXICITY  . Diovan [Valsartan] Other (See Comments)    HYPOTENSION  . Doxycycline Diarrhea and Other (See Comments)    VISUAL DISTURBANCE  . Flexeril [Cyclobenzaprine] Other (See Comments)    FATIGUE  . Keflex [Cephalexin] Diarrhea  . Verapamil Other (See Comments)    EDEMA  . Codeine Other (See Comments)    unknown     Current Outpatient Medications  Medication Sig Dispense Refill  . acetaminophen (TYLENOL) 500 MG tablet Take 1,000 mg by mouth daily.     Marland Kitchen albuterol (PROAIR HFA) 108 (90 Base) MCG/ACT inhaler 2 inhalations 10-15 minutes apart evert 4 hours as needed for Asthma Flare 48 g 3  . allopurinol (ZYLOPRIM) 300 MG tablet Take 300 mg by mouth daily.    Marland Kitchen aspirin EC 81 MG tablet Take 81 mg by mouth daily.    . Cholecalciferol (VITAMIN D3) 5000 units CAPS Takes 2 caps (10,000 units) daily (Patient taking differently: Take 5,000 Units by mouth daily. )    . digoxin (LANOXIN) 0.125 MG tablet Take 1 tablet daily for Heart Rhythm (Patient taking differently: Take 0.125 mg by mouth daily. Take 1 tablet daily for Heart Rhythm) 90 tablet 3  . diltiazem (TIAZAC) 120 MG 24 hr capsule TAKE 1 CAPSULE BY MOUTH EVERY DAY 90 capsule 1  . Fluticasone-Umeclidin-Vilant (TRELEGY ELLIPTA) 100-62.5-25 MCG/INH AEPB Inhale 1 puff into the lungs daily. 60 each 3  .  gabapentin (NEURONTIN) 600 MG tablet TAKE 1/2 TO 1 TABLET BY MOUTH 3 TIMES DAILY. 270 tablet 1  . ipratropium (ATROVENT) 0.02 % nebulizer solution Take 2.5 mLs (0.5 mg total) by nebulization every 4 (four) hours as needed for wheezing or shortness of breath. 75 mL 2  . ipratropium (ATROVENT) 0.06 % nasal spray Use 1 to 2 sprays each Nostril 2 to 3 x / day as needed 45 mL 3  . levalbuterol (XOPENEX HFA) 45 MCG/ACT inhaler Inhale 2 puffs into the lungs every 4 (four) hours as needed for wheezing. 1 Inhaler 3  . levothyroxine (SYNTHROID, LEVOTHROID) 50 MCG tablet Take 1 & 1/2 tablets daily or as directed 90 tablet 1  . montelukast (SINGULAIR) 10 MG tablet TAKE 1 TABLET EVERY DAY FOR ALLERGIES 90 tablet 1  . Olopatadine HCl 0.2 % SOLN Place 1 drop into both eyes daily.     . pantoprazole (PROTONIX) 40 MG tablet Take 1 tablet (40 mg total) by mouth daily.    [name] Glycol-Propyl Glycol (SYSTANE OP) Apply 1 drop to eye 4 (four) times daily.     . potassium chloride SA (KLOR-CON M20) 20 MEQ tablet TAKE 2 TABLETS (40 MEQ TOTAL) BY MOUTH DAILY. (Patient taking differently: Take 40 mEq by mouth daily. ) 180 tablet 1  . pravastatin (PRAVACHOL) 40 MG tablet TAKE 1/2 TABLET BY MOUTH AT BEDTIME FOR CHOLESTEROL 90 tablet 1  . prednisoLONE acetate (PRED FORTE) 1 %  ophthalmic suspension INSTILL 1 DROP INTO BOTH EYES FOUR TIMES A DAY FOR 10 DAYS AND THEN TWICE DAY.  1  . predniSONE (DELTASONE) 10 MG tablet Take 4 tablets for 3 days; Take 3 tablets for 4 days; Take 2 tablets for 3 days; Take 1 tablet for 4 days 34 tablet 0  . prochlorperazine (COMPAZINE) 5 MG tablet TAKE 1 TABLET BY MOUTH THREE TIMES A DAY AS NEEDED FOR VERTIGO OR NAUSEA 60 tablet 0  . promethazine-dextromethorphan (PROMETHAZINE-DM) 6.25-15 MG/5ML syrup Take 5 mLs by mouth 4 (four) times daily as needed for cough. 240 mL 1  . torsemide (DEMADEX) 20 MG tablet Take 40 mg by mouth 2 (two) times daily.    Marland Kitchen triamcinolone cream (KENALOG) 0.1 % Apply 1  application topically at bedtime.    Marland Kitchen warfarin (COUMADIN) 3 MG tablet TAKE 1-2 TABLETS DAILY AS DIRECTED (Patient taking differently: 2 mg. TAKE 1-2 TABLETS DAILY AS DIRECTED) 180 tablet 1   No current facility-administered medications for this visit.      Past Medical History:  Diagnosis Date  . Atrial fibrillation ([place])   . Migraine headache   . Osteopenia     ROS:   All systems reviewed and negative except as noted in the HPI.   Past Surgical History:  Procedure Laterality Date  . ABDOMINAL AORTIC ENDOVASCULAR STENT GRAFT N/A 04/11/2013   Procedure: ABDOMINAL AORTIC ENDOVASCULAR STENT GRAFT WITH RIGHT FEMORAL PATCH ANGIOPLASTY;  Surgeon: Mal Misty, MD;  Location: [facility];  Service: Vascular;  Laterality: N/A;  . BREAST SURGERY     LEFT BREAST BIOPSY  . broken leg Left 1970s  . CARDIAC CATHETERIZATION    . CHOLECYSTECTOMY  1972  . COLONOSCOPY    . EYE SURGERY Bilateral   . Laser vein procedure    . REFRACTIVE SURGERY    . TONSILLECTOMY       Family History  Problem Relation Age of Onset  . Cirrhosis Mother   . Cancer Mother 58       PANCREAS  . Heart defect Sister   . Breast cancer Sister        age 5  . Heart disease Sister   . Stroke Sister   . Alcohol abuse Father   . Depression Father   . Hypertension Brother   . Hyperlipidemia Son   . Heart disease Daughter      Social History   Socioeconomic History  . Marital status: Married    Spouse name: Not on file  . Number of children: Not on file  . Years of education: Not on file  . Highest education level: Not on file  Occupational History  . Not on file  Social Needs  . Financial resource strain: Not on file  . Food insecurity:    Worry: Not on file    Inability: Not on file  . Transportation needs:    Medical: Not on file    Non-medical: Not on file  Tobacco Use  . Smoking status: Former Smoker    Types: Cigarettes    Last attempt to quit: 08/10/2002    Years since quitting: 15.0  .  Smokeless tobacco: Never Used  . Tobacco comment: History of tobacco abuse  Substance and Sexual Activity  . Alcohol use: Yes    Alcohol/week: 0.0 oz    Comment: rare  . Drug use: No  . Sexual activity: Yes    Birth control/protection: Post-menopausal  Lifestyle  . Physical activity:    Days  per week: Not on file    Minutes per session: Not on file  . Stress: Not on file  Relationships  . Social connections:    Talks on phone: Not on file    Gets together: Not on file    Attends religious service: Not on file    Active member of club or organization: Not on file    Attends meetings of clubs or organizations: Not on file    Relationship status: Not on file  . Intimate partner violence:    Fear of current or ex partner: Not on file    Emotionally abused: Not on file    Physically abused: Not on file    Forced sexual activity: Not on file  Other Topics Concern  . Not on file  Social History Narrative   Lives in [place] with husband   One daughter     BP (!) 144/66   Pulse 88   Ht 5\' 3"  (1.6 m)   Wt 207 lb (93.9 kg)   BMI 36.67 kg/m   Physical Exam:  Well appearing 77 yo woman, NAD HEENT: Unremarkable Neck:  6 cm JVD, no thyromegally Lymphatics:  No adenopathy Back:  No CVA tenderness Lungs:  Clear with no wheezes HEART:  Regular rate rhythm, no murmurs, no rubs, no clicks Abd:  soft, positive bowel sounds, no organomegally, no rebound, no guarding Ext:  2 plus pulses, no edema, no cyanosis, no clubbing Skin:  No rashes no nodules Neuro:  CN II through XII intact, motor grossly intact  EKG - atrial fib with a controlled VR.  DEVICE  Normal device function.  See PaceArt for details.   Assess/Plan: 1. Atrial fib - her rates are controlled. She will continue her current meds.  2. CAD - she denies anginal symptoms. She will continue her current meds.  3. Peripheral vascular disease - she denies claudication. She will continue her current meds.  Mikle Bosworth.D.

## 2017-09-05 NOTE — Progress Notes (Signed)
   Procedure Note  Patient: Katie [name]             Date of Birth: 03/24/1940           MRN: [id number]             Visit Date: 09/05/2017   HPI: Katie [name] comes in today requesting an injection in her left ankle.  She has known posttraumatic arthritis of the left ankle.  She said no new injury.  Last injection skin 1/31 /19. She is also requesting a Monovisc injection in her knee if this can be obtained.  A sample was available today and therefore we will inject her right knee which she has known osteoarthritis.  She was last given a Monovisc injection in the right knee on 04/14/17.  Left ankle: She has swelling both lower legs.  Calves are supple and nontender.  No signs of infection of either leg particularly the left ankle.  She has global tenderness about the left ankle.  Limited dorsiflexion plantarflexion. Right knee no effusion abnormal warmth erythema overall good range of motion no instability with valgus varus stressing  Procedures:   Visit Diagnoses: Post-traumatic arthritis of ankle, left  Primary osteoarthritis of right knee  Large Joint Inj: R knee on 09/05/2017 10:00 AM Indications: pain Details: 22 G 1.5 in needle, anterolateral approach  Arthrogram: No  Medications: 88 mg Hyaluronan 88 MG/4ML Outcome: tolerated well, no immediate complications Procedure, treatment alternatives, risks and benefits explained, specific risks discussed. Consent was given by the patient. Immediately prior to procedure a time out was called to verify the correct patient, procedure, equipment, support staff and site/side marked as required. Patient was prepped and draped in the usual sterile fashion.   Medium Joint Inj on 09/05/2017 6:00 PM Indications: pain Details: 25 G 1.5 in needle, anterolateral approach Medications: 2 mL lidocaine 1 %; 40 mg methylPREDNISolone acetate 40 MG/ML Consent was given by the patient. Immediately prior to procedure a time out was called to verify the correct  patient, procedure, equipment, support staff and site/side marked as required. Patient was prepped and draped in the usual sterile fashion.      Plan: She will follow-up on an as-needed basis in regards to the knee and ankle.  She understands that Monovisc can be only given every 6 months and when samples are available as her insurance does not cover this.  Regards the ankle would like her to at least wait 5 months between the injections possible.

## 2017-09-07 ENCOUNTER — Other Ambulatory Visit: Payer: Self-pay

## 2017-09-07 MED ORDER — TORSEMIDE 20 MG PO TABS: 40.0000 mg | ORAL_TABLET | Freq: Two times a day (BID) | ORAL | 3 refills | Status: DC

## 2017-09-12 NOTE — Progress Notes (Signed)
Assessment and Plan:  Diagnoses and all orders for this visit:  Chronic atrial fibrillation ([place]) Check INR and will adjust medication according to labs.  Discussed if patient falls to immediately contact office or go to ER. Discussed foods that can increase or decrease Coumadin levels. Patient understands to call the office before starting a new medication. Follow up in one month.   Essential hypertension Continue medication Monitor blood pressure at home; call if consistently over 130/80 Continue DASH diet.   Reminder to go to the ER if any CP, SOB, nausea, dizziness, severe HA, changes vision/speech, left arm numbness and tingling and jaw pain.  Chronic combined systolic and diastolic congestive heart failure (HCC) Weights stable Emphasized salt restriction, less than 2000mg  a day. Encouraged daily monitoring of the patient's weight, call office if 5 lb weight loss or gain in a day.  Encouraged regular exercise. If any increasing shortness of breath, swelling, or chest pressure go to ER immediately.  decrease your fluid intake to less than 2 L daily please remember to always increase your potassium intake with any increase of your fluid pill.   COPD (chronic obstructive pulmonary disease) with chronic bronchitis (HCC) Symptoms stable; continue inhalers Follow up with pulmonology  Morbid obesity due to excess calories ([place]) Long discussion about weight loss, diet, and exercise Recommended diet heavy in fruits and veggies and low in animal meats, cheeses, and dairy products, appropriate calorie intake Will add wellbutrin 150 mg ER to see if this helps, consider topamax if no significant progress Discussed cutting down on late night snacking, how to reduce temptation Discussed appropriate weight for height and initial weight goal (200 lb) Follow up at next visit  Medication management CBC, BMP/GFR   Further disposition pending results of labs. Discussed med's effects and SE's.    Over 30 minutes of exam, counseling, chart review, and critical decision making was performed.   Future Appointments  Date Time Provider [place]  10/10/2017 10:45 AM Tanda Rockers, MD [facility] None  10/18/2017 10:30 AM [name], MD [facility] None  02/02/2018  9:00 AM [name], MD [facility] None  08/23/2018  2:00 PM Liane Comber, NP [facility] None    ------------------------------------------------------------------------------------------------------------------   HPI BP 136/84   Pulse 64   Temp 97.9 F (36.6 C)   Resp 18   Ht 5\' 3"  (1.6 m)   Wt 206 lb (93.4 kg)   SpO2 96%   BMI 36.49 kg/m   77 y.o.female presents for 1 month follow up for coumadin/chronic care management. Patient has hx/o Chronic Afib since 1999 & has been on Coumadin since. 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 In 2014, Lexiscan showed EF 50%. She had an endovascular graft placed in 2015 and is followed by vascular. 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   BMI is Body mass index is 36.49 kg/m., she has been working on diet and exercise.  Her blood pressure has been controlled at home, today their BP is BP: 136/84  She does workout - going to yoga. She denies chest pain, shortness of breath, dizziness.  She has a history of Combined Systolic and Diastolic, denies dyspnea on exertion, orthopnea, paroxysmal nocturnal dyspnea and edema. Positive for none and  - she does endorse shortness of breath when exerting herself in hot humid weather. On bevespi daily and doing well on this new inhaler. Uses rescue inhaler as needed- typically once a week or so.  Wt Readings from Last 3 Encounters:  09/13/17 206 lb (93.4 kg)  09/05/17 207 lb (93.9 kg)  09/05/17 203 lb (92.1 kg)   Patient is on Coumadin for Chronic atrial fibrillation ([place]) [I48.2] Patient's last INR is  Lab  Results  Component Value Date   INR 2.9 (H) 08/11/2017   INR 1.4 (H) 07/05/2017   INR 2.2 (H) 06/21/2017    Patient denies SOB, CP, dizziness, nose bleeds, easy bleeding, and blood in stool/urine. Her coumadin dose was not changed last visit. She has taken ABX, has not missed any doses and denies a fall.    Coumadin: 3 mg - takes 1 tab daily    Past Medical History:  Diagnosis Date  . Atrial fibrillation ([place])   . Migraine headache   . Osteopenia      Allergies  Allergen Reactions  . Amiodarone Other (See Comments)    PULMONARY TOXICITY  . Diovan [Valsartan] Other (See Comments)    HYPOTENSION  . Doxycycline Diarrhea and Other (See Comments)    VISUAL DISTURBANCE  . Flexeril [Cyclobenzaprine] Other (See Comments)    FATIGUE  . Keflex [Cephalexin] Diarrhea  . Verapamil Other (See Comments)    EDEMA  . Codeine Other (See Comments)    unknown    Current Outpatient Medications on File Prior to Visit  Medication Sig  . acetaminophen (TYLENOL) 500 MG tablet Take 1,000 mg by mouth daily.   Marland Kitchen albuterol (PROAIR HFA) 108 (90 Base) MCG/ACT inhaler 2 inhalations 10-15 minutes apart evert 4 hours as needed for Asthma Flare  . allopurinol (ZYLOPRIM) 300 MG tablet Take 300 mg by mouth daily.  Marland Kitchen aspirin EC 81 MG tablet Take 81 mg by mouth daily.  . Cholecalciferol (VITAMIN D3) 5000 units CAPS Takes 2 caps (10,000 units) daily (Patient taking differently: Take 5,000 Units by mouth daily. )  . digoxin (LANOXIN) 0.125 MG tablet Take 1 tablet daily for Heart Rhythm (Patient taking differently: Take 0.125 mg by mouth daily. Take 1 tablet daily for Heart Rhythm)  . diltiazem (TIAZAC) 120 MG 24 hr capsule TAKE 1 CAPSULE BY MOUTH EVERY DAY  . gabapentin (NEURONTIN) 600 MG tablet TAKE 1/2 TO 1 TABLET BY MOUTH 3 TIMES DAILY.  Marland Kitchen Glycopyrrolate-Formoterol 9-4.8 MCG/ACT AERO Inhale 1 puff into the lungs daily.  Marland Kitchen ipratropium (ATROVENT) 0.02 % nebulizer solution Take 2.5 mLs (0.5 mg total) by  nebulization every 4 (four) hours as needed for wheezing or shortness of breath.  Marland Kitchen ipratropium (ATROVENT) 0.06 % nasal spray Use 1 to 2 sprays each Nostril 2 to 3 x / day as needed  . levalbuterol (XOPENEX HFA) 45 MCG/ACT inhaler Inhale 2 puffs into the lungs every 4 (four) hours as needed for wheezing.  Marland Kitchen levothyroxine (SYNTHROID, LEVOTHROID) 50 MCG tablet Take 1 & 1/2 tablets daily or as directed  . montelukast (SINGULAIR) 10 MG tablet TAKE 1 TABLET EVERY DAY FOR ALLERGIES  . Olopatadine HCl 0.2 % SOLN Place 1 drop into both eyes daily.   . pantoprazole (PROTONIX) 40 MG tablet Take 1 tablet (40 mg total) by mouth daily.  [name] Glycol-Propyl Glycol (SYSTANE OP) Apply 1 drop to eye 4 (four) times daily.   . potassium chloride SA (KLOR-CON M20) 20 MEQ tablet TAKE 2 TABLETS (40 MEQ TOTAL) BY MOUTH DAILY. (Patient taking differently: Take 40 mEq by mouth daily. )  . pravastatin (PRAVACHOL) 40 MG tablet TAKE 1/2 TABLET BY MOUTH AT BEDTIME FOR CHOLESTEROL  . prednisoLONE acetate (PRED FORTE) 1 %  ophthalmic suspension INSTILL 1 DROP INTO BOTH EYES FOUR TIMES A DAY FOR 10 DAYS AND THEN TWICE DAY.  Marland Kitchen prochlorperazine (COMPAZINE) 5 MG tablet TAKE 1 TABLET BY MOUTH THREE TIMES A DAY AS NEEDED FOR VERTIGO OR NAUSEA  . torsemide (DEMADEX) 20 MG tablet Take 2 tablets (40 mg total) by mouth 2 (two) times daily.  Marland Kitchen triamcinolone cream (KENALOG) 0.1 % Apply 1 application topically at bedtime.  Marland Kitchen warfarin (COUMADIN) 3 MG tablet TAKE 1-2 TABLETS DAILY AS DIRECTED (Patient taking differently: 2 mg. TAKE 1-2 TABLETS DAILY AS DIRECTED)   No current facility-administered medications on file prior to visit.     ROS: all negative except above.   Physical Exam:  BP 136/84   Pulse 64   Temp 97.9 F (36.6 C)   Resp 18   Ht 5\' 3"  (1.6 m)   Wt 206 lb (93.4 kg)   SpO2 96%   BMI 36.49 kg/m   General Appearance: Well nourished, in no apparent distress. Eyes: PERRLA, EOMs, conjunctiva no swelling or  erythema Sinuses: No Frontal/maxillary tenderness ENT/Mouth: Ext aud canals clear, TMs without erythema, bulging. No erythema, swelling, or exudate on post pharynx.  Tonsils not swollen or erythematous. Hearing normal.  Neck: Supple, thyroid normal.  Respiratory: Respiratory effort normal, BS equal bilaterally without rales, rhonchi, wheezing or stridor.  Cardio: Soft heart sounds, Rhythm irregular, normal rate with 3/6 systolic murmur. Thready peripheral pulses upper and lower extremities without edema Abdomen: Soft, + BS.  Non tender, no guarding, rebound, hernias, masses. Lymphatics: Non tender without lymphadenopathy.  Musculoskeletal: Full ROM, 5/5 strength, normal gait.  Skin: Warm, dry without rashes, lesions, ecchymosis.  Neuro: Cranial nerves intact. Normal muscle tone, no cerebellar symptoms. Sensation intact.  Psych: Awake and oriented X 3, normal affect, Insight and Judgment appropriate.     Izora Ribas, NP 11:51 AM [facility]

## 2017-09-13 ENCOUNTER — Encounter: Payer: Self-pay | Admitting: Adult Health

## 2017-09-13 ENCOUNTER — Ambulatory Visit (INDEPENDENT_AMBULATORY_CARE_PROVIDER_SITE_OTHER): Payer: Medicare Other | Admitting: Adult Health

## 2017-09-13 VITALS — BP 136/84 | HR 64 | Temp 97.9°F | Resp 18 | Ht 63.0 in | Wt 206.0 lb

## 2017-09-13 DIAGNOSIS — I10 Essential (primary) hypertension: Secondary | ICD-10-CM | POA: Diagnosis not present

## 2017-09-13 DIAGNOSIS — I482 Chronic atrial fibrillation, unspecified: Secondary | ICD-10-CM

## 2017-09-13 DIAGNOSIS — Z79899 Other long term (current) drug therapy: Secondary | ICD-10-CM | POA: Diagnosis not present

## 2017-09-13 DIAGNOSIS — J449 Chronic obstructive pulmonary disease, unspecified: Secondary | ICD-10-CM

## 2017-09-13 DIAGNOSIS — I5042 Chronic combined systolic (congestive) and diastolic (congestive) heart failure: Secondary | ICD-10-CM | POA: Diagnosis not present

## 2017-09-13 MED ORDER — BUPROPION HCL ER (XL) 150 MG PO TB24: 150.0000 mg | ORAL_TABLET | Freq: Every day | ORAL | 1 refills | Status: DC

## 2017-09-13 NOTE — Patient Instructions (Signed)
Aim for 7+ servings of fruits and vegetables daily  80+ fluid ounces of water or unsweet tea for healthy kidneys  Limit alcohol intake  Limit animal fats in diet for cholesterol and heart health - choose grass fed whenever available  Aim for low stress - take time to unwind and care for your mental health  Aim for 150 min of moderate intensity exercise weekly for heart health, and weights twice weekly for bone health  Aim for 7-9 hours of sleep daily   Drink 1/2 your body weight in fluid ounces of water daily; drink a tall glass of water 30 min before meals  Don't eat until you're stuffed- listen to your stomach and eat until you are 80% full   Try eating off of a salad plate; wait 10 min after finishing before going back for seconds  Start by eating the vegetables on your plate; aim for 50% of your meals to be fruits or vegetables  Then eat your protein - lean meats (grass fed if possible), fish, beans, nuts in moderation  Eat your carbs/starch last ONLY if you still are hungry. If you can, stop before finishing it all  Avoid sugar and flour - the closer it looks to it's original form in nature, typically the better it is for you  Splurge in moderation - "assign" days when you get to splurge and have the "bad stuff" - I like to follow a 80% - 20% plan- "good" choices 80 % of the time, "bad" choices in moderation 20% of the time  Simple equation is: Calories out > calories in = weight loss - even if you eat the bad stuff, if you limit portions, you will still lose weight   8 Critical Weight-Loss Tips That Aren't Diet and Exercise  1. STARVE THE DISTRACTIONS  All too often when we eat, we're also multitasking: watching TV, answering emails, scrolling through social media. These habits are detrimental to having a strong, clear, healthy relationship with food, and they can hinder our ability to make dietary changes.  In order to truly focus on what you're eating, how much you're  eating, why you're eating those specific foods and, most importantly, how those foods make you feel, you need to starve the distractions. That means when you eat, just eat. Focus on your food, the process it went through to end up on your plate, where it came from and how it nourishes you. With this technique, you're more likely to finish a meal feeling satiated.  2.  CONSIDER WHAT YOU'RE NOT WILLING TO DO  This might sound counterintuitive, but it can help provide a "why" when motivation is waning. Declare, in writing, what you are unwilling to do, for example "I am unwilling to be the old dad who cannot play sports with my children".  So consider what you're not willing to accept, write it down, and keep it at the ready.  3.  STOP LABELING FOOD "GOOD" AND "BAD"  You've probably heard someone say they ate something "bad." Maybe you've even said it yourself.  The trouble with 'bad' foods isn't that they'll send you to the grave after a bite or two. The trouble comes when we eat excessive portions of really calorie-dense foods meal after meal, day after day.  Instead of labeling foods as good or bad, think about which foods you can eat a lot of, and which ones you should just eat a little of. Then, plan ways to eat the foods you  really like in portions that fit with your overall goals. A good example of this would be having a slice of pizza alongside a club salad with chicken breast, avocado and a bit of dressing. This is vastly different than 3 slices of pizza, 4 breadsticks with cheese sauce and half of a liter of regular soda.  4.  BRUSH YOUR TEETH AFTER YOU EAT  Getting your mindset in order is important, but sometimes small habits can make a big difference. After eating, you still have the taste of food in their mouth, which often causes people to eat more even if they are full or engage in a nibble or two of dessert.  Brushing your teeth will remove the taste of food from your mouth, and the  clean, minty freshness will serve as a cue that mealtime is over.  5.  FOCUS ON CROWDING NOT CUTTING  The most common first step during 'dieting' is to cut. We cut our portion sizes down, we cut out 'bad' foods, we cut out entire food groups. This act of cutting puts [place] and our minds into scarcity mode.  When something is off-limits, even if you're able to avoid it for a while, you could end up bingeing on it later because you've gone so long without it. So, instead of cutting, focus on crowding. If you crowd your plate and fill it up with more foods like veggies and protein, it simply allows less room for the other stuff. In other words, shift your focus away from what you can't eat, and celebrate the foods that will help you reach your goals.  6.  TAKE TRACKING A STEP FURTHER  Track what you eat, when you ate it, how much you ate and how that food made you feel. Being completely honest with yourself and writing down every single thing that passes through your lips will help you start to notice that maybe you actually do snack, possibly take in more sugar than you thought, eat when you're bored rather than just hungry or maybe that you have a habit of snacking before bed while watching TV.  The difference from simply tracking your food intake is you're taking into account how food makes you feel, as well as what you're doing while you're eating. This is about becoming more mindful of what, when and why you eat.  7.  PRIORITIZE GOOD SLEEP  One of the strongest risk factors for being overweight is poor sleep. When you're feeling tired, you're more likely to choose unhealthy comfort foods and to skip your workout. Additionally, sleep deprivation may slow down your metabolism. [place] [employer]! Therefore, sleeping 7-8 hours per night can help with weight loss without having to change your diet or increase your physical activity. And if you feel you snore and still wake up tired, talk with me about sleep  apnea.  8.  SET ASIDE TIME TO DISCONNECT  Just get out there. Disconnect from the electronics and connect to the elements. Not only will this help reduce stress (a major factor in weight gain) by giving your mind a break from the constant stimulation we've all become so accustomed to, but it may also reprogram your brain to connect with yourself and what you're feeling.

## 2017-09-14 LAB — BASIC METABOLIC PANEL WITH GFR
BUN / CREAT RATIO: 25 (calc) — AB (ref 6–22)
BUN: 32 mg/dL — AB (ref 7–25)
CALCIUM: 9.8 mg/dL (ref 8.6–10.4)
CO2: 29 mmol/L (ref 20–32)
Chloride: 101 mmol/L (ref 98–110)
Creat: 1.29 mg/dL — ABNORMAL HIGH (ref 0.60–0.93)
GFR, EST AFRICAN AMERICAN: 47 mL/min/{1.73_m2} — AB (ref >=60)
GFR, EST NON AFRICAN AMERICAN: 40 mL/min/{1.73_m2} — AB (ref >=60)
Glucose, Bld: 77 mg/dL (ref 65–99)
POTASSIUM: 4.5 mmol/L (ref 3.5–5.3)
Sodium: 137 mmol/L (ref 135–146)

## 2017-09-14 LAB — CBC WITH DIFFERENTIAL/PLATELET
BASOS ABS: 51 {cells}/uL (ref 0–200)
BASOS PCT: 0.4 %
Eosinophils Absolute: 254 cells/uL (ref 15–500)
Eosinophils Relative: 2 %
HCT: 45.1 % — ABNORMAL HIGH (ref 35.0–45.0)
HEMOGLOBIN: 15.2 g/dL (ref 11.7–15.5)
Lymphs Abs: 1943 cells/uL (ref 850–3900)
MCH: 30.2 pg (ref 27.0–33.0)
MCHC: 33.7 g/dL (ref 32.0–36.0)
MCV: 89.7 fL (ref 80.0–100.0)
MONOS PCT: 10.4 %
MPV: 10.4 fL (ref 7.5–12.5)
NEUTROS ABS: 9131 {cells}/uL — AB (ref 1500–7800)
Neutrophils Relative %: 71.9 %
PLATELETS: 247 10*3/uL (ref 140–400)
RBC: 5.03 10*6/uL (ref 3.80–5.10)
RDW: 14.3 % (ref 11.0–15.0)
Total Lymphocyte: 15.3 %
WBC mixed population: 1321 cells/uL — ABNORMAL HIGH (ref 200–950)
WBC: 12.7 10*3/uL — ABNORMAL HIGH (ref 3.8–10.8)

## 2017-09-14 LAB — PROTIME-INR
INR: 1.8 — ABNORMAL HIGH
Prothrombin Time: 19.3 s — ABNORMAL HIGH (ref 9.0–11.5)

## 2017-09-21 ENCOUNTER — Ambulatory Visit (INDEPENDENT_AMBULATORY_CARE_PROVIDER_SITE_OTHER): Payer: Self-pay

## 2017-09-21 ENCOUNTER — Encounter (INDEPENDENT_AMBULATORY_CARE_PROVIDER_SITE_OTHER): Payer: Self-pay | Admitting: Orthopaedic Surgery

## 2017-09-21 ENCOUNTER — Emergency Department (HOSPITAL_COMMUNITY)
Admission: EM | Admit: 2017-09-21 | Discharge: 2017-09-22 | Disposition: A | Discharge status: Home / Self Care | Payer: Medicare Other | Attending: Emergency Medicine | Admitting: Emergency Medicine

## 2017-09-21 ENCOUNTER — Encounter (HOSPITAL_COMMUNITY): Payer: Self-pay

## 2017-09-21 ENCOUNTER — Ambulatory Visit (INDEPENDENT_AMBULATORY_CARE_PROVIDER_SITE_OTHER): Payer: Medicare Other | Admitting: Orthopaedic Surgery

## 2017-09-21 ENCOUNTER — Other Ambulatory Visit: Payer: Self-pay

## 2017-09-21 DIAGNOSIS — I952 Hypotension due to drugs: Secondary | ICD-10-CM

## 2017-09-21 DIAGNOSIS — H10413 Chronic giant papillary conjunctivitis, bilateral: Secondary | ICD-10-CM | POA: Diagnosis not present

## 2017-09-21 DIAGNOSIS — M5441 Lumbago with sciatica, right side: Secondary | ICD-10-CM

## 2017-09-21 DIAGNOSIS — Z7901 Long term (current) use of anticoagulants: Secondary | ICD-10-CM

## 2017-09-21 DIAGNOSIS — H01022 Squamous blepharitis right lower eyelid: Secondary | ICD-10-CM | POA: Diagnosis not present

## 2017-09-21 DIAGNOSIS — R0902 Hypoxemia: Secondary | ICD-10-CM

## 2017-09-21 DIAGNOSIS — E039 Hypothyroidism, unspecified: Secondary | ICD-10-CM | POA: Insufficient documentation

## 2017-09-21 DIAGNOSIS — R001 Bradycardia, unspecified: Secondary | ICD-10-CM | POA: Diagnosis not present

## 2017-09-21 DIAGNOSIS — T481X5A Adverse effect of skeletal muscle relaxants [neuromuscular blocking agents], initial encounter: Secondary | ICD-10-CM | POA: Diagnosis not present

## 2017-09-21 DIAGNOSIS — T50905A Adverse effect of unspecified drugs, medicaments and biological substances, initial encounter: Secondary | ICD-10-CM

## 2017-09-21 DIAGNOSIS — I509 Heart failure, unspecified: Secondary | ICD-10-CM | POA: Insufficient documentation

## 2017-09-21 DIAGNOSIS — N183 Chronic kidney disease, stage 3 unspecified: Secondary | ICD-10-CM | POA: Diagnosis present

## 2017-09-21 DIAGNOSIS — K117 Disturbances of salivary secretion: Secondary | ICD-10-CM | POA: Diagnosis not present

## 2017-09-21 DIAGNOSIS — I4891 Unspecified atrial fibrillation: Secondary | ICD-10-CM | POA: Diagnosis present

## 2017-09-21 DIAGNOSIS — H26491 Other secondary cataract, right eye: Secondary | ICD-10-CM | POA: Diagnosis not present

## 2017-09-21 DIAGNOSIS — F329 Major depressive disorder, single episode, unspecified: Secondary | ICD-10-CM | POA: Diagnosis present

## 2017-09-21 DIAGNOSIS — I2510 Atherosclerotic heart disease of native coronary artery without angina pectoris: Secondary | ICD-10-CM | POA: Diagnosis present

## 2017-09-21 DIAGNOSIS — T7840XA Allergy, unspecified, initial encounter: Secondary | ICD-10-CM | POA: Insufficient documentation

## 2017-09-21 DIAGNOSIS — Z961 Presence of intraocular lens: Secondary | ICD-10-CM | POA: Diagnosis not present

## 2017-09-21 DIAGNOSIS — G8929 Other chronic pain: Secondary | ICD-10-CM

## 2017-09-21 DIAGNOSIS — I130 Hypertensive heart and chronic kidney disease with heart failure and stage 1 through stage 4 chronic kidney disease, or unspecified chronic kidney disease: Secondary | ICD-10-CM | POA: Diagnosis not present

## 2017-09-21 DIAGNOSIS — T404X5A Adverse effect of other synthetic narcotics, initial encounter: Secondary | ICD-10-CM | POA: Diagnosis not present

## 2017-09-21 DIAGNOSIS — E782 Mixed hyperlipidemia: Secondary | ICD-10-CM | POA: Diagnosis present

## 2017-09-21 DIAGNOSIS — J449 Chronic obstructive pulmonary disease, unspecified: Secondary | ICD-10-CM | POA: Diagnosis not present

## 2017-09-21 DIAGNOSIS — K219 Gastro-esophageal reflux disease without esophagitis: Secondary | ICD-10-CM | POA: Diagnosis present

## 2017-09-21 DIAGNOSIS — H01021 Squamous blepharitis right upper eyelid: Secondary | ICD-10-CM | POA: Diagnosis not present

## 2017-09-21 DIAGNOSIS — M109 Gout, unspecified: Secondary | ICD-10-CM | POA: Diagnosis present

## 2017-09-21 DIAGNOSIS — R682 Dry mouth, unspecified: Secondary | ICD-10-CM

## 2017-09-21 DIAGNOSIS — I10 Essential (primary) hypertension: Secondary | ICD-10-CM | POA: Diagnosis present

## 2017-09-21 LAB — URINALYSIS, ROUTINE W REFLEX MICROSCOPIC
Bilirubin Urine: NEGATIVE
Glucose, UA: NEGATIVE mg/dL
Hgb urine dipstick: NEGATIVE
KETONES UR: NEGATIVE mg/dL
LEUKOCYTES UA: NEGATIVE
NITRITE: NEGATIVE
PH: 6 (ref 5.0–8.0)
PROTEIN: NEGATIVE mg/dL
Specific Gravity, Urine: 1.006 (ref 1.005–1.030)

## 2017-09-21 LAB — CBC
HEMATOCRIT: 42.7 % (ref 36.0–46.0)
Hemoglobin: 13.1 g/dL (ref 12.0–15.0)
MCH: 29.6 pg (ref 26.0–34.0)
MCHC: 30.7 g/dL (ref 30.0–36.0)
MCV: 96.4 fL (ref 78.0–100.0)
PLATELETS: 195 10*3/uL (ref 150–400)
RBC: 4.43 MIL/uL (ref 3.87–5.11)
RDW: 15.6 % — AB (ref 11.5–15.5)
WBC: 8.5 10*3/uL (ref 4.0–10.5)

## 2017-09-21 LAB — BASIC METABOLIC PANEL
Anion gap: 11 (ref 5–15)
BUN: 20 mg/dL (ref 8–23)
CALCIUM: 9.2 mg/dL (ref 8.9–10.3)
CO2: 23 mmol/L (ref 22–32)
CREATININE: 1.75 mg/dL — AB (ref 0.44–1.00)
Chloride: 101 mmol/L (ref 98–111)
GFR calc Af Amer: 32 mL/min — ABNORMAL LOW (ref >60)
GFR, EST NON AFRICAN AMERICAN: 27 mL/min — AB (ref >60)
Glucose, Bld: 181 mg/dL — ABNORMAL HIGH (ref 70–99)
Potassium: 4.8 mmol/L (ref 3.5–5.1)
SODIUM: 135 mmol/L (ref 135–145)

## 2017-09-21 MED ORDER — SODIUM CHLORIDE 0.9 % IV BOLUS
500.0000 mL | Freq: Once | INTRAVENOUS | Status: AC
  Administered 2017-09-21: 500 mL via INTRAVENOUS

## 2017-09-21 MED ORDER — TIZANIDINE HCL 4 MG PO TABS: 4.0000 mg | ORAL_TABLET | Freq: Three times a day (TID) | ORAL | 0 refills | Status: DC | PRN

## 2017-09-21 MED ORDER — TRAMADOL HCL 50 MG PO TABS: 50.0000 mg | ORAL_TABLET | Freq: Four times a day (QID) | ORAL | 0 refills | Status: DC | PRN

## 2017-09-21 MED ORDER — METHYLPREDNISOLONE 4 MG PO TABS: ORAL_TABLET | ORAL | 0 refills | Status: DC

## 2017-09-21 NOTE — ED Notes (Signed)
Pt alert  No distress

## 2017-09-21 NOTE — Progress Notes (Signed)
Office Visit Note   Patient: Katie [name]           Date of Birth: [birth year]           MRN: [id number] Visit Date: 09/21/2017              Requested by: [name], [address] [place], [place] [address] PCP: [name], MD   Assessment & Plan: Visit Diagnoses:  1. Chronic right-sided low back pain with right-sided sciatica     Plan: I talked her back extension exercise and weight loss I think this is going to help her more.  We will try a steroid taper as well as a muscle relaxant and some tramadol and see if this will help her symptoms.  She will alternate ice and heat as well.  We will see her back in about 4 weeks to see how she is doing overall.  Next step would be likely an MRI if she continues to have sciatic symptoms.  Follow-Up Instructions: Return in about 1 month (around 10/19/2017).   Orders:  Orders Placed This Encounter  Procedures  . XR Lumbar Spine 2-3 Views   Meds ordered this encounter  Medications  . methylPREDNISolone (MEDROL) 4 MG tablet    Sig: Medrol dose pack. Take as instructed    Dispense:  21 tablet    Refill:  0  . tiZANidine (ZANAFLEX) 4 MG tablet    Sig: Take 1 tablet (4 mg total) by mouth every 8 (eight) hours as needed for muscle spasms.    Dispense:  30 tablet    Refill:  0  . traMADol (ULTRAM) 50 MG tablet    Sig: Take 1-2 tablets (50-100 mg total) by mouth every 6 (six) hours as needed.    Dispense:  40 tablet    Refill:  0      Procedures: No procedures performed   Clinical Data: No additional findings.   Subjective: Chief Complaint  Patient presents with  . Lower Back - Pain  Patient comes in today for evaluation treatment of low back pain is been going on for about 6 months now.  She says about 20 years ago she had back pain after pulled muscle.  She is also been through some therapy and some exercises but is been a while.  She was in the hospital this year for what she said was double  pneumonia.  She is on blood thinning medication and cannot take anti-inflammatories.  She reports pain going down her right backside area but does not go all the way down her legs.  She denies any change in bowel bladder function.  HPI  Review of Systems She currently denies any headache, chest pain, shortness of breath, fever, chills, nausea, vomiting.  Objective: Vital Signs: There were no vitals taken for this visit.  Physical Exam She is alert and oriented x3 in no acute distress Ortho Exam She is moderately obese individual when she ambulates she walks slowly and over.  Posture is not great.  She has good strength in bilateral extremities but pain with flexion extension lumbar spine and only a mild straight leg raise on the right side but nothing worrisome. Specialty Comments:  No specialty comments available.  Imaging: Xr Lumbar Spine 2-3 Views  Result Date: 09/21/2017 2 views of lumbar spine show no acute findings.    PMFS History: Patient Active Problem List   Diagnosis Date Noted  . PAH (pulmonary artery hypertension) ([place]) 06/23/2017  .  Esophageal dysphagia 05/15/2017  . Gout 03/20/2017  . Other abnormal glucose 09/19/2015  . AAA (abdominal aortic aneurysm) without rupture ([place]) 04/22/2015  . Morbid obesity due to excess calories ([place]) 08/03/2014  . PVD (peripheral vascular disease) with claudication ([place]) 10/16/2013  . CKD  stage III (GFR 51 ml/min) 06/08/2013  . Hyperlipidemia 06/08/2013  . Medication management 06/08/2013  . 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 Long term current use of anticoagulant therapy 04/23/2013  . Vitamin D deficiency 02/15/2013  . Hypothyroidism   . Osteopenia   . Congestive heart failure ([place]) 11/25/2008  . Migraine headache 11/22/2008  . Essential hypertension 11/22/2008  . Coronary atherosclerosis 11/22/2008  . Atrial fibrillation ([place]) 11/22/2008  . COPD (chronic obstructive pulmonary disease) with chronic  bronchitis ([place]) 11/22/2008  . GERD 11/22/2008   Past Medical History:  Diagnosis Date  . Atrial fibrillation ([place])   . Migraine headache   . Osteopenia     Family History  Problem Relation Age of Onset  . Cirrhosis Mother   . Cancer Mother 50       PANCREAS  . Heart defect Sister   . Breast cancer Sister        age 75  . Heart disease Sister   . Stroke Sister   . Alcohol abuse Father   . Depression Father   . Hypertension Brother   . Hyperlipidemia Son   . Heart disease Daughter     Past Surgical History:  Procedure Laterality Date  . ABDOMINAL AORTIC ENDOVASCULAR STENT GRAFT N/A 04/11/2013   Procedure: ABDOMINAL AORTIC ENDOVASCULAR STENT GRAFT WITH RIGHT FEMORAL PATCH ANGIOPLASTY;  Surgeon: Mal Misty, MD;  Location: [facility];  Service: Vascular;  Laterality: N/A;  . BREAST SURGERY     LEFT BREAST BIOPSY  . broken leg Left 1970s  . CARDIAC CATHETERIZATION    . CHOLECYSTECTOMY  1972  . COLONOSCOPY    . EYE SURGERY Bilateral   . Laser vein procedure    . REFRACTIVE SURGERY    . TONSILLECTOMY     Social History   Occupational History  . Not on file  Tobacco Use  . Smoking status: Former Smoker    Types: Cigarettes    Last attempt to quit: 08/10/2002    Years since quitting: 15.1  . Smokeless tobacco: Never Used  . Tobacco comment: History of tobacco abuse  Substance and Sexual Activity  . Alcohol use: Yes    Alcohol/week: 0.0 oz    Comment: rare  . Drug use: No  . Sexual activity: Yes    Birth control/protection: Post-menopausal

## 2017-09-21 NOTE — ED Notes (Signed)
Pt. Stated she took a muscle relax and is sleepy; temp. Would not read because pt. Had cold water before coming back attempted axillary temp, did not read.

## 2017-09-21 NOTE — ED Triage Notes (Signed)
Pt went to see orthopedic doctor today for sciatic nerve pain, took muscle relaxer tizanidine and prednisone at 4pm since pt has been somewhat lethargic and weak, pt states "im drunk".

## 2017-09-21 NOTE — ED Provider Notes (Signed)
[facility] EMERGENCY DEPARTMENT Provider Note   CSN: [id number] Arrival date & time: 09/21/17  1922     History   Chief Complaint Chief Complaint  Patient presents with  . Medication Reaction    HPI Katie [name] is a 77 y.o. female who presents for medication reaction. The patient saw her orthopedist today for R low back pain. She was prescribed Medrol dose pack, zanaflex 4mg , and tramadol. Around 2pm she took 2, 4mg  zanaflex and her tramadol. Since that time she has been extremely somnolent. She was slurring her speech and leaned over the side of a chair.  Patient arrived at the emergency department she was found to have bradycardia to 25 bpm.  She states that her it is normally in the 70s.  She has a history of chronic atrial fibrillation.  Patient is also complaining of severe dry mouth and states that she has not been able to urinate since she took the medications.  She is feeling improved but still drowsy.  She denies chest pain or shortness of breath.  HPI  Past Medical History:  Diagnosis Date  . Atrial fibrillation ([place])   . Migraine headache   . Osteopenia     Patient Active Problem List   Diagnosis Date Noted  . PAH (pulmonary artery hypertension) ([place]) 06/23/2017  . Esophageal dysphagia 05/15/2017  . Gout 03/20/2017  . Other abnormal glucose 09/19/2015  . AAA (abdominal aortic aneurysm) without rupture ([place]) 04/22/2015  . Morbid obesity due to excess calories ([place]) 08/03/2014  . PVD (peripheral vascular disease) with claudication ([place]) 10/16/2013  . CKD  stage III (GFR 51 ml/min) 06/08/2013  . Hyperlipidemia 06/08/2013  . Medication management 06/08/2013  . 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 Long term current use of anticoagulant therapy 04/23/2013  . Vitamin D deficiency 02/15/2013  . Hypothyroidism   . Osteopenia   . Congestive heart failure ([place]) 11/25/2008  . Migraine headache 11/22/2008  . Essential hypertension  11/22/2008  . Coronary atherosclerosis 11/22/2008  . Atrial fibrillation ([place]) 11/22/2008  . COPD (chronic obstructive pulmonary disease) with chronic bronchitis ([place]) 11/22/2008  . GERD 11/22/2008    Past Surgical History:  Procedure Laterality Date  . ABDOMINAL AORTIC ENDOVASCULAR STENT GRAFT N/A 04/11/2013   Procedure: ABDOMINAL AORTIC ENDOVASCULAR STENT GRAFT WITH RIGHT FEMORAL PATCH ANGIOPLASTY;  Surgeon: Mal Misty, MD;  Location: [facility];  Service: Vascular;  Laterality: N/A;  . BREAST SURGERY     LEFT BREAST BIOPSY  . broken leg Left 1970s  . CARDIAC CATHETERIZATION    . CHOLECYSTECTOMY  1972  . COLONOSCOPY    . EYE SURGERY Bilateral   . Laser vein procedure    . REFRACTIVE SURGERY    . TONSILLECTOMY       OB History    Gravida  4   Para  3   Term  3   Preterm      AB  1   Living  3     SAB      TAB      Ectopic      Multiple      Live Births               Home Medications    Prior to Admission medications   Medication Sig Start Date End Date Taking? Authorizing Provider  acetaminophen (TYLENOL) 500 MG tablet Take 1,000 mg by mouth every 6 (six) hours as needed for moderate pain.    Yes  [provider]  albuterol (PROAIR HFA) 108 (90 Base) MCG/ACT inhaler 2 inhalations 10-15 minutes apart evert 4 hours as needed for Asthma Flare 07/20/17  Yes [name], MD  allopurinol (ZYLOPRIM) 300 MG tablet Take 300 mg by mouth daily.   Yes [provider]  aspirin EC 81 MG tablet Take 81 mg by mouth daily.   Yes [provider]  buPROPion (WELLBUTRIN XL) 150 MG 24 hr tablet Take 1 tablet (150 mg total) by mouth daily. 09/13/17  Yes Liane Comber, NP  Cholecalciferol (VITAMIN D3) 5000 units CAPS Takes 2 caps (10,000 units) daily Patient taking differently: Take 5,000 Units by mouth daily.  01/09/17  Yes [name], MD  digoxin (LANOXIN) 0.125 MG tablet Take 1 tablet daily for Heart Rhythm Patient taking differently:  Take 0.125 mg by mouth daily. Take 1 tablet daily for Heart Rhythm 04/26/17 04/26/18 Yes [name], MD  diltiazem [facility]) 120 MG 24 hr capsule TAKE 1 CAPSULE BY MOUTH EVERY DAY 06/20/17  Yes [name], MD  gabapentin (NEURONTIN) 600 MG tablet TAKE 1/2 TO 1 TABLET BY MOUTH 3 TIMES DAILY. 08/31/17  Yes Liane Comber, NP  Glycopyrrolate-Formoterol 9-4.8 MCG/ACT AERO Inhale 1 puff into the lungs daily.   Yes [provider]  ipratropium (ATROVENT) 0.02 % nebulizer solution Take 2.5 mLs (0.5 mg total) by nebulization every 4 (four) hours as needed for wheezing or shortness of breath. 06/21/17 06/21/18 Yes Vicie Mutters, PA-C  ipratropium (ATROVENT) 0.06 % nasal spray Use 1 to 2 sprays each Nostril 2 to 3 x / day as needed 07/05/17 07/06/18 Yes [name], MD  levalbuterol [facility] HFA) 45 MCG/ACT inhaler Inhale 2 puffs into the lungs every 4 (four) hours as needed for wheezing. 06/23/17  Yes Magdalen Spatz, NP  levothyroxine (SYNTHROID, LEVOTHROID) 50 MCG tablet Take 1 & 1/2 tablets daily or as directed Patient taking differently: Take 25 mcg by mouth daily before breakfast.  07/23/17  Yes [name], MD  montelukast (SINGULAIR) 10 MG tablet TAKE 1 TABLET EVERY DAY FOR ALLERGIES 08/17/17  Yes [name], MD  Olopatadine HCl 0.2 % SOLN Place 1 drop into both eyes daily.    Yes [provider]  pantoprazole (PROTONIX) 40 MG tablet Take 1 tablet (40 mg total) by mouth daily. 07/16/16  Yes Ghimire, Henreitta Leber, MD  Polyethyl Glycol-Propyl Glycol (SYSTANE OP) Apply 1 drop to eye 4 (four) times daily.    Yes [provider]  potassium chloride SA (KLOR-CON M20) 20 MEQ tablet TAKE 2 TABLETS (40 MEQ TOTAL) BY MOUTH DAILY. Patient taking differently: Take 40 mEq by mouth daily.  04/11/17  Yes Vicie Mutters, PA-C  pravastatin (PRAVACHOL) 40 MG tablet TAKE 1/2 TABLET BY MOUTH AT BEDTIME FOR CHOLESTEROL Patient taking differently: Take 20 mg by mouth at bedtime.  07/02/17   Yes [name], MD  prednisoLONE acetate (PRED FORTE) 1 % ophthalmic suspension INSTILL 1 DROP INTO BOTH EYES FOUR TIMES A DAY FOR 10 DAYS AND THEN TWICE DAY. 04/27/17  Yes [provider]  predniSONE (DELTASONE) 5 MG tablet Take 10-40 mg by mouth See admin instructions. TAKE 8TABS DAILY X3DAYS,THEN 6TABS X4DAYS,THEN 4TABS X3DAYS, THEN 2TAB X4DAYS THEN STOP 08/30/17  Yes [provider]  prochlorperazine (COMPAZINE) 5 MG tablet TAKE 1 TABLET BY MOUTH THREE TIMES A DAY AS NEEDED FOR VERTIGO OR NAUSEA 06/22/17  Yes Vicie Mutters, PA-C  tiZANidine (ZANAFLEX) 4 MG tablet Take 1 tablet (4 mg total) by mouth every 8 (eight) hours  as needed for muscle spasms. 09/21/17  Yes Mcarthur Rossetti, MD  torsemide (DEMADEX) 20 MG tablet Take 2 tablets (40 mg total) by mouth 2 (two) times daily. 09/07/17  Yes Evans Lance, MD  traMADol (ULTRAM) 50 MG tablet Take 1-2 tablets (50-100 mg total) by mouth every 6 (six) hours as needed. 09/21/17  Yes Mcarthur Rossetti, MD  triamcinolone cream (KENALOG) 0.1 % Apply 1 application topically at bedtime.   Yes [provider]  warfarin (COUMADIN) 3 MG tablet TAKE 1-2 TABLETS DAILY AS DIRECTED Patient taking differently: Take 2-3 mg by mouth See admin instructions. 2mg  on Sun/Mon/Tues/Thurs/Fri/Sat and 3mg  on Wed 05/12/17  Yes Vicie Mutters, PA-C    Family History Family History  Problem Relation Age of Onset  . Cirrhosis Mother   . Cancer Mother 71       PANCREAS  . Heart defect Sister   . Breast cancer Sister        age 31  . Heart disease Sister   . Stroke Sister   . Alcohol abuse Father   . Depression Father   . Hypertension Brother   . Hyperlipidemia Son   . Heart disease Daughter     Social History Social History   Tobacco Use  . Smoking status: Former Smoker    Types: Cigarettes    Last attempt to quit: 08/10/2002    Years since quitting: 15.1  . Smokeless tobacco: Never Used  . Tobacco comment: History of  tobacco abuse  Substance Use Topics  . Alcohol use: Yes    Alcohol/week: 0.0 oz    Comment: rare  . Drug use: No     Allergies   Amiodarone; Diovan [valsartan]; Doxycycline; Flexeril [cyclobenzaprine]; Keflex [cephalexin]; Verapamil; and Codeine   Review of Systems Review of Systems  Ten systems reviewed and are negative for acute change, except as noted in the HPI.   Physical Exam Updated Vital Signs BP (!) 118/57   Pulse (!) 44   Resp 16   SpO2 95%   Physical Exam  Constitutional: She is oriented to person, place, and time. She appears well-developed and well-nourished. No distress.  HENT:  Head: Normocephalic and atraumatic.  Eyes: Conjunctivae are normal. No scleral icterus.  Neck: Normal range of motion.  Cardiovascular: Regular rhythm and normal heart sounds. Exam reveals no gallop and no friction rub.  No murmur heard. Bradycardic. Irregularly irregular rhythm  Pulmonary/Chest: Effort normal and breath sounds normal. No respiratory distress.  Abdominal: Soft. Bowel sounds are normal. She exhibits no distension and no mass. There is no tenderness. There is no guarding.  Neurological: She is alert and oriented to person, place, and time.  Skin: Skin is warm and dry. She is not diaphoretic.  Psychiatric: Her behavior is normal.  Nursing note and vitals reviewed.    ED Treatments / Results  Labs (all labs ordered are listed, but only abnormal results are displayed) Labs Reviewed  BASIC METABOLIC PANEL - Abnormal; Notable for the following components:      Result Value   Glucose, Bld 181 (*)    Creatinine, Ser 1.75 (*)    GFR calc non Af Amer 27 (*)    GFR calc Af Amer 32 (*)    All other components within normal limits  CBC - Abnormal; Notable for the following components:   RDW 15.6 (*)    All other components within normal limits  URINALYSIS, ROUTINE W REFLEX MICROSCOPIC  CBG MONITORING, ED    EKG  None  ED ECG REPORT   Date: 09/22/2017  Rate:  56  Rhythm: atrial fibrillation  QRS Axis: normal  Intervals: normal  ST/T Wave abnormalities: normal  Conduction Disutrbances:nonspecific intraventricular conduction delay  Narrative Interpretation:   Old EKG Reviewed: unchanged  I have personally reviewed the EKG tracing and agree with the computerized printout as noted.  Radiology Xr Lumbar Spine 2-3 Views  Result Date: 09/21/2017 2 views of lumbar spine show no acute findings.   Procedures Procedures (including critical care time)  Medications Ordered in ED Medications - No data to display   Initial Impression / Assessment and Plan / ED Course  I have reviewed the triage vital signs and the nursing notes.  Pertinent labs & imaging results that were available during my care of the patient were reviewed by me and considered in my medical decision making (see chart for details).     Patient side effects from taking 2 tablets of Zanaflex.  She is also had a little bit of acute kidney injury.  Her fluids were repleted here in the emergency department.  Patient able to urinate and her bradycardia and hypotension have resolved.  Half-life of the medication is about 8 hours and she is 10 hours out from ingestion of the medication and feeling greatly improved.  She has been seen and shared visit with Dr. Ellender Hose who agrees that she may be discharged at this time.  She is ambulatory in the emergency department.  Final Clinical Impressions(s) / ED Diagnoses   Final diagnoses:  Adverse effect of drug, initial encounter  Bradycardia  Xerostomia  Hypotension due to drugs    ED Discharge Orders    None       Margarita Mail, PA-C 09/22/17 0009    Duffy Bruce, MD 09/23/17 1059

## 2017-09-21 NOTE — ED Notes (Signed)
The pt was seen by her ortho doctor earlier today  She was given rx for tizanidine tramadol and prednisone  She took 2 tablets of the tizanidine instead  Of one  approx 1530. She has been talking out of her head according to the family.  The pt feels like she much better now aND HER HEAD IS CLEARING UP  HER NORMAL HEART RHY IS AF WHICH SHE CURRENTLY IS IN.

## 2017-09-22 ENCOUNTER — Telehealth (INDEPENDENT_AMBULATORY_CARE_PROVIDER_SITE_OTHER): Payer: Self-pay | Admitting: *Deleted

## 2017-09-22 NOTE — Discharge Instructions (Addendum)
Please DO NOT TAKE any more zanaflex (tinazidine)! Be sparing with your tramadol and start with a half dose.  Return for any new or worsening symptoms.

## 2017-09-22 NOTE — Telephone Encounter (Signed)
Pt called stating she was in to see Dr. Ninfa Linden yesterday and was given Tizandine and had a reaction to the medication, had to go to the ED, ED doctor told her to stop taking the medication and to continue with the prednisone and the tramadol as directed. Pt states since she had a reaction to the Tizanadine she is scared to take the others, and wants to know if would be ok. Please advise.  [phone]

## 2017-09-22 NOTE — Telephone Encounter (Signed)
Please advise 

## 2017-09-22 NOTE — Telephone Encounter (Signed)
Patient aware of the below message  

## 2017-09-22 NOTE — Telephone Encounter (Signed)
It should be ok to take the others.  Have her just start with the medrol dose pack first and see how she does with that.

## 2017-09-23 ENCOUNTER — Other Ambulatory Visit: Payer: Self-pay | Admitting: Physician Assistant

## 2017-09-28 DIAGNOSIS — Z961 Presence of intraocular lens: Secondary | ICD-10-CM | POA: Diagnosis not present

## 2017-09-28 DIAGNOSIS — H26493 Other secondary cataract, bilateral: Secondary | ICD-10-CM | POA: Diagnosis not present

## 2017-09-29 ENCOUNTER — Encounter: Payer: Self-pay | Admitting: Internal Medicine

## 2017-09-29 ENCOUNTER — Ambulatory Visit (INDEPENDENT_AMBULATORY_CARE_PROVIDER_SITE_OTHER): Payer: Medicare Other | Admitting: Internal Medicine

## 2017-09-29 VITALS — BP 122/76 | HR 75 | Temp 97.5°F | Resp 16 | Ht 63.0 in | Wt 201.0 lb

## 2017-09-29 DIAGNOSIS — Z79899 Other long term (current) drug therapy: Secondary | ICD-10-CM

## 2017-09-29 DIAGNOSIS — I10 Essential (primary) hypertension: Secondary | ICD-10-CM | POA: Diagnosis not present

## 2017-09-29 DIAGNOSIS — E559 Vitamin D deficiency, unspecified: Secondary | ICD-10-CM

## 2017-09-29 DIAGNOSIS — Z7901 Long term (current) use of anticoagulants: Secondary | ICD-10-CM

## 2017-09-29 DIAGNOSIS — R7309 Other abnormal glucose: Secondary | ICD-10-CM | POA: Diagnosis not present

## 2017-09-29 DIAGNOSIS — I5042 Chronic combined systolic (congestive) and diastolic (congestive) heart failure: Secondary | ICD-10-CM | POA: Diagnosis not present

## 2017-09-29 DIAGNOSIS — E039 Hypothyroidism, unspecified: Secondary | ICD-10-CM | POA: Diagnosis not present

## 2017-09-29 DIAGNOSIS — I482 Chronic atrial fibrillation, unspecified: Secondary | ICD-10-CM

## 2017-09-29 DIAGNOSIS — J40 Bronchitis, not specified as acute or chronic: Secondary | ICD-10-CM

## 2017-09-29 DIAGNOSIS — E782 Mixed hyperlipidemia: Secondary | ICD-10-CM | POA: Diagnosis not present

## 2017-09-29 MED ORDER — PREDNISONE 20 MG PO TABS: ORAL_TABLET | ORAL | 0 refills | Status: DC

## 2017-09-29 MED ORDER — AZITHROMYCIN 250 MG PO TABS: ORAL_TABLET | ORAL | 1 refills | Status: DC

## 2017-09-29 NOTE — Progress Notes (Signed)
This very nice 77 y.o. MWF presents for 3 month follow up with HTN, HLD, Pre-Diabetes and Vitamin D Deficiency. Patient has hx/o Gout quiescent on Allopurinol.  Lab Results  Component Value Date   INR 1.8 (H) 09/13/2017   INR 2.9 (H) 08/11/2017   INR 1.4 (H) 07/05/2017      Patient is treated for HTN (1999) and CKD3. She also has a non-ischemic cardiomyopathy (Lexiscan in 2015 showed EF 50%) . She's been on Coumadin for Chronic Afib (1999). BP has been controlled at home. Today's BP is at goal - 122/76. Patient has had no complaints of any cardiac type chest pain, palpitations, dyspnea / orthopnea / PND, dizziness, claudication, or dependent edema.     Hyperlipidemia is controlled with diet & meds. Patient denies myalgias or other med SE's. Last Lipids were at goal albeit sl elevated Trig's. Lab Results  Component Value Date   CHOL 155 07/05/2017   HDL 40 (L) 07/05/2017   LDLCALC 89 07/05/2017   TRIG 166 (H) 07/05/2017   CHOLHDL 3.9 07/05/2017      Patient was initiated on thyroid replacement in 2012.      Also, the patient has Morbid Obesity (BMI 36+) and also PreDiabetes (A1c 5.7%/2013) and has had no symptoms of reactive hypoglycemia, diabetic polys, paresthesias or visual blurring.  Last A1c was  Lab Results  Component Value Date   HGBA1C 5.8 (H) 07/05/2017      Further, the patient also has history of Vitamin D Deficiency  ("33"/2008) and supplements vitamin D without any suspected side-effects. Last vitamin D was at goal:   Lab Results  Component Value Date   VD25OH 72 07/05/2017   Current Outpatient Medications on File Prior to Visit  Medication Sig  . acetaminophen (TYLENOL) 500 MG tablet Take 1,000 mg by mouth every 6 (six) hours as needed for moderate pain.   Marland Kitchen albuterol (PROAIR HFA) 108 (90 Base) MCG/ACT inhaler 2 inhalations 10-15 minutes apart evert 4 hours as needed for Asthma Flare  . allopurinol (ZYLOPRIM) 300 MG tablet Take 300 mg by mouth daily.  Marland Kitchen aspirin  EC 81 MG tablet Take 81 mg by mouth daily.  Marland Kitchen buPROPion (WELLBUTRIN XL) 150 MG 24 hr tablet Take 1 tablet (150 mg total) by mouth daily.  . Cholecalciferol (VITAMIN D3) 5000 units CAPS Takes 2 caps (10,000 units) daily (Patient taking differently: Take 5,000 Units by mouth daily. )  . digoxin (LANOXIN) 0.125 MG tablet Take 1 tablet daily for Heart Rhythm (Patient taking differently: Take 0.125 mg by mouth daily. Take 1 tablet daily for Heart Rhythm)  . diltiazem (TIAZAC) 120 MG 24 hr capsule TAKE 1 CAPSULE BY MOUTH EVERY DAY  . gabapentin (NEURONTIN) 600 MG tablet TAKE 1/2 TO 1 TABLET BY MOUTH 3 TIMES DAILY.  Marland Kitchen Glycopyrrolate-Formoterol 9-4.8 MCG/ACT AERO Inhale 1 puff into the lungs daily.  Marland Kitchen ipratropium (ATROVENT) 0.02 % nebulizer solution Take 2.5 mLs (0.5 mg total) by nebulization every 4 (four) hours as needed for wheezing or shortness of breath.  Marland Kitchen ipratropium (ATROVENT) 0.06 % nasal spray Use 1 to 2 sprays each Nostril 2 to 3 x / day as needed  . levalbuterol (XOPENEX HFA) 45 MCG/ACT inhaler Inhale 2 puffs into the lungs every 4 (four) hours as needed for wheezing.  Marland Kitchen levothyroxine (SYNTHROID, LEVOTHROID) 50 MCG tablet Take 1 & 1/2 tablets daily or as directed (Patient taking differently: Take 25 mcg by mouth daily before breakfast. )  . montelukast (  SINGULAIR) 10 MG tablet TAKE 1 TABLET EVERY DAY FOR ALLERGIES  . Olopatadine HCl 0.2 % SOLN Place 1 drop into both eyes daily.   . pantoprazole (PROTONIX) 40 MG tablet Take 1 tablet (40 mg total) by mouth daily.  [name] Glycol-Propyl Glycol (SYSTANE OP) Apply 1 drop to eye 4 (four) times daily.   . potassium chloride SA (KLOR-CON M20) 20 MEQ tablet TAKE 2 TABLETS (40 MEQ TOTAL) BY MOUTH DAILY. (Patient taking differently: Take 40 mEq by mouth daily. )  . pravastatin (PRAVACHOL) 40 MG tablet TAKE 1/2 TABLET BY MOUTH AT BEDTIME FOR CHOLESTEROL (Patient taking differently: Take 20 mg by mouth at bedtime. )  . prednisoLONE acetate (PRED FORTE) 1  % ophthalmic suspension INSTILL 1 DROP INTO BOTH EYES FOUR TIMES A DAY FOR 10 DAYS AND THEN TWICE DAY.  Marland Kitchen predniSONE (DELTASONE) 5 MG tablet Take 10-40 mg by mouth See admin instructions. TAKE 8TABS DAILY X3DAYS,THEN 6TABS X4DAYS,THEN 4TABS X3DAYS, THEN 2TAB X4DAYS THEN STOP  . prochlorperazine (COMPAZINE) 5 MG tablet TAKE 1 TABLET BY MOUTH THREE TIMES A DAY AS NEEDED FOR VERTIGO OR NAUSEA  . torsemide (DEMADEX) 20 MG tablet Take 2 tablets (40 mg total) by mouth 2 (two) times daily.  . traMADol (ULTRAM) 50 MG tablet Take 1-2 tablets (50-100 mg total) by mouth every 6 (six) hours as needed.  . triamcinolone cream (KENALOG) 0.1 % Apply 1 application topically at bedtime.  Marland Kitchen warfarin (COUMADIN) 3 MG tablet TAKE 1-2 TABLETS DAILY AS DIRECTED (Patient taking differently: Take 2-3 mg by mouth See admin instructions. 2mg  on Sun/Mon/Tues/Thurs/Fri/Sat and 3mg  on Wed)   No current facility-administered medications on file prior to visit.    Allergies  Allergen Reactions  . Amiodarone Other (See Comments)    PULMONARY TOXICITY  . Diovan [Valsartan] Other (See Comments)    HYPOTENSION  . Doxycycline Diarrhea and Other (See Comments)    VISUAL DISTURBANCE  . Flexeril [Cyclobenzaprine] Other (See Comments)    FATIGUE  . Keflex [Cephalexin] Diarrhea  . Verapamil Other (See Comments)    EDEMA  . Codeine Other (See Comments)    unknown   PMHx:   Past Medical History:  Diagnosis Date  . Atrial fibrillation ([place])   . Migraine headache   . Osteopenia    Immunization History  Administered Date(s) Administered  . Influenza, High Dose Seasonal PF 12/20/2013, 12/09/2014, 11/14/2015, 01/10/2017  . Influenza-Unspecified 01/02/2013  . PPD Test 07/17/2016  . Pneumococcal Conjugate-13 01/23/2014  . Pneumococcal-Unspecified 03/15/1993, 05/31/2008  . Td 03/16/2007  . Tdap 08/11/2017  . Varicella 02/19/2008   Past Surgical History:  Procedure Laterality Date  . ABDOMINAL AORTIC ENDOVASCULAR STENT  GRAFT N/A 04/11/2013   Procedure: ABDOMINAL AORTIC ENDOVASCULAR STENT GRAFT WITH RIGHT FEMORAL PATCH ANGIOPLASTY;  Surgeon: Mal Misty, MD;  Location: [facility];  Service: Vascular;  Laterality: N/A;  . BREAST SURGERY     LEFT BREAST BIOPSY  . broken leg Left 1970s  . CARDIAC CATHETERIZATION    . CHOLECYSTECTOMY  1972  . COLONOSCOPY    . EYE SURGERY Bilateral   . Laser vein procedure    . REFRACTIVE SURGERY    . TONSILLECTOMY     FHx:    Reviewed / unchanged  SHx:    Reviewed / unchanged   Systems Review:  Constitutional: Denies fever, chills, wt changes, headaches, insomnia, fatigue, night sweats, change in appetite. Eyes: Denies redness, blurred vision, diplopia, discharge, itchy, watery eyes.  ENT: Denies discharge, congestion, post nasal  drip, epistaxis, sore throat, earache, hearing loss, dental pain, tinnitus, vertigo, sinus pain, snoring.  CV: Denies chest pain, palpitations, irregular heartbeat, syncope, dyspnea, diaphoresis, orthopnea, PND, claudication or edema. Respiratory: denies cough, dyspnea, DOE, pleurisy, hoarseness, laryngitis, wheezing.  Gastrointestinal: Denies dysphagia, odynophagia, heartburn, reflux, water brash, abdominal pain or cramps, nausea, vomiting, bloating, diarrhea, constipation, hematemesis, melena, hematochezia  or hemorrhoids. Genitourinary: Denies dysuria, frequency, urgency, nocturia, hesitancy, discharge, hematuria or flank pain. Musculoskeletal: Denies arthralgias, myalgias, stiffness, jt. swelling, pain, limping or strain/sprain.  Skin: Denies pruritus, rash, hives, warts, acne, eczema or change in skin lesion(s). Neuro: No weakness, tremor, incoordination, spasms, paresthesia or pain. Psychiatric: Denies confusion, memory loss or sensory loss. Endo: Denies change in weight, skin or hair change.  Heme/Lymph: No excessive bleeding, bruising or enlarged lymph nodes.  Physical Exam  BP 122/76   Pulse 75   Temp (!) 97.5 F (36.4 C)   Resp 16    Ht 5\' 3"  (1.6 m)   Wt 201 lb (91.2 kg)   SpO2 94%   BMI 35.61 kg/m   Appears  over nourished, well groomed  and in no distress.  Eyes: PERRLA, EOMs, conjunctiva no swelling or erythema. Sinuses:  Slight  frontal/maxillary tenderness ENT/Mouth: EAC's clear, TM's nl w/o erythema, bulging. Nares clear w/o erythema, swelling, exudates. Oropharynx clear without erythema or exudates. Oral hygiene is good. Tongue normal, non obstructing. Hearing intact.  Neck: Supple. Thyroid not palpable. Car 2+/2+ without bruits, nodes or JVD. Chest:  BS with few scattered rales & rhonchi not fully clearing with cough & no wheezing or stridor.  Cor: Heart sounds normal w/ regular rate and rhythm without sig. murmurs, gallops, clicks or rubs. Peripheral pulses normal and equal  without edema.  Abdomen: Soft & bowel sounds normal. Non-tender w/o guarding, rebound, hernias, masses or organomegaly.  Lymphatics: Unremarkable.  Musculoskeletal: Full ROM all peripheral extremities, joint stability, 5/5 strength and normal gait.  Skin: Warm, dry without exposed rashes, lesions or ecchymosis apparent.  Neuro: Cranial nerves intact, reflexes equal bilaterally. Sensory-motor testing grossly intact. Tendon reflexes grossly intact.  Pysch: Alert & oriented x 3.  Insight and judgement nl & appropriate. No ideations.  Assessment and Plan:  1. Essential hypertension  - Continue medication, monitor blood pressure at home.  - Continue DASH diet.  Reminder to go to the ER if any CP,  SOB, nausea, dizziness, severe HA, changes vision/speech.  - CBC with Differential/Platelet - COMPLETE METABOLIC PANEL WITH GFR - Magnesium - TSH  2. Hyperlipidemia, mixed  - Continue diet/meds, exercise,& lifestyle modifications.  - Continue monitor periodic cholesterol/liver & renal functions   - Lipid panel - TSH  3. Abnormal glucose  - Continue diet, exercise, lifestyle modifications.  - Monitor appropriate labs.  -  Hemoglobin A1c - Insulin, random  4. Vitamin D deficiency  - Continue supplementation.   - VITAMIN D 25 Hydroxyl  5. Chronic atrial fibrillation (HCC)  - TSH - Protime-INR  6. Chronic combined systolic and diastolic congestive heart failure (HCC)  - Digoxin level  7. Hypothyroidism  - TSH  8. Long term current use of anticoagulant therapy  - Protime-INR  9. Medication management  - CBC with Differential/Platelet - COMPLETE METABOLIC PANEL WITH GFR - Magnesium - Lipid panel - TSH - Hemoglobin A1c - Insulin, random - VITAMIN D 25 Hydroxyl - Digoxin level  10. Bronchitis  - predniSONE (DELTASONE) 20 MG tablet; 1 tab 3 x day for 3 days, then 1 tab 2 x day for 3  days, then 1 tab 1 x day for 5 days  Dispense: 20 tablet; Refill: 0 - azithromycin (ZITHROMAX) 250 MG tablet; Take 2 tablets (500 mg) on  Day 1,  followed by 1 tablet (250 mg) once daily on Days 2 through 5.  Dispense: 6 each; Refill: 1      Discussed  regular exercise, BP monitoring, weight control to achieve/maintain BMI less than 25 and discussed med and SE's. Recommended labs to assess and monitor clinical status with further disposition pending results of labs. Over 30 minutes of exam, counseling, chart review was performed.

## 2017-09-29 NOTE — Patient Instructions (Signed)
Bleeding Precautions When on Anticoagulant Therapy  WHAT IS ANTICOAGULANT THERAPY? Anticoagulant therapy is taking medicine to prevent or reduce blood clots. It is also called blood thinner therapy. Blood clots that form in your blood vessels can be dangerous. They can break loose and travel to your heart, lungs, or brain. This increases your risk of a heart attack or stroke. Anticoagulant therapy causes blood to clot more slowly. You may need anticoagulant therapy if you have:  A medical condition that increases the likelihood that blood clots will form.  A heart defect or a problem with heart rhythm. It is also a common treatment after heart surgery, such as valve replacement. WHAT ARE COMMON TYPES OF ANTICOAGULANT THERAPY? Anticoagulant medicine can be injected or taken by mouth.If you need anticoagulant therapy quickly at the hospital, the medicine may be injected under your skin or given through an IV tube. Heparin is a common example of an anticoagulant that you may get at the hospital. Most anticoagulant therapy is in the form of pills that you take at home every day. These may include:  Aspirin. This common blood thinner works by preventing blood cells (platelets) from sticking together to form a clot. Aspirin is not as strong as anticoagulants that slow down the time that it takes for your body to form a clot.  Clopidogrel. This is a newer type of drug that affects platelets. It is stronger than aspirin.  Warfarin. This is the most common anticoagulant. It changes the way your body uses vitamin K, a vitamin that helps your blood to clot. The risk of bleeding is higher with warfarin than with aspirin. You will need frequent blood tests to make sure you are taking the safest amount.  New anticoagulants. Several new drugs have been approved. They are all taken by mouth. Studies show that these drugs work as well as warfarin. They do not require blood testing. They may cause less bleeding  risk than warfarin. WHAT DO I NEED TO REMEMBER WHEN TAKING ANTICOAGULANT THERAPY? Anticoagulant therapy decreases your risk of forming a blood clot, but it increases your risk of bleeding. Work closely with your health care provider to make sure you are taking your medicine safely. These tips can help:  Learn ways to reduce your risk of bleeding.  If you are taking warfarin: ? Have blood tests as ordered by your health care provider. ? Do not make any sudden changes to your diet. Vitamin K in your diet can make warfarin less effective. ? Do not get pregnant. This medicine may cause birth defects.  Take your medicine at the same time every day. If you forget to take your medicine, take it as soon as you remember. If you miss a whole day, do not double your dose of medicine. Take your normal dose and call your health care provider to check in.  Do not stop taking your medicine on your own.  Tell your health care provider before you start taking any new medicine, vitamin, or herbal product. Some of these could interfere with your therapy.  Tell all of your health care providers that you are on anticoagulant therapy.  Do not have surgery, medical procedures, or dental work until you tell your health care provider that you are on anticoagulant therapy. WHAT CAN AFFECT HOW ANTICOAGULANTS WORK? Certain foods, vitamins, medicines, supplements, and herbal medicines change the way that anticoagulant therapy works. They may increase or decrease the effects of your anticoagulant therapy. Either result can be dangerous for you.    Many over-the-counter medicines for pain, colds, or stomach problems interfere with anticoagulant therapy. Take these only as told by your health care provider.  Do not drink alcohol. It can interfere with your medicine and increase your risk of an injury that causes bleeding.  If you are taking warfarin, do not begin eating more foods that contain vitamin K. These include  leafy green vegetables. Ask your health care provider if you should avoid any foods. WHAT ARE SOME WAYS TO PREVENT BLEEDING? You can prevent bleeding by taking certain precautions:  Be extra careful when you use knives, scissors, or other sharp objects.  Use an electric razor instead of a blade.  Do not use toothpicks.  Use a soft toothbrush.  Wear shoes that have nonskid soles.  Use bath mats and handrails in your bathroom.  Wear gloves while you do yard work.  Wear a helmet when you ride a bike.  Wear your seat belt.  Prevent falls by removing loose rugs and extension cords from areas where you walk.  Do not play contact sports or participate in other activities that have a high risk of injury. [place] PROVIDER? Call your health care provider if:  You miss a dose of medicine: ? And you are not sure what to do. ? For more than one day.  You have: ? Menstrual bleeding that is heavier than normal. ? Blood in your urine. ? A bloody nose or bleeding gums. ? Easy bruising. ? Blood in your stool (feces) or have black and tarry stool. ? Side effects from your medicine.  You feel weak or dizzy.  You become pregnant. Seek immediate medical care if:  You have bleeding that will not stop.  You have sudden and severe headache or belly pain.  You vomit or you cough up bright red blood.  You have a severe blow to your head. WHAT ARE SOME QUESTIONS TO ASK MY HEALTH CARE PROVIDER?  What is the best anticoagulant therapy for my condition?  What side effects should I watch for?  When should I take my medicine? What should I do if I forget to take it?  Will I need to have regular blood tests?  Do I need to change my diet? Are there foods or drinks that I should avoid?  What activities are safe for me?  What should I do if I want to get pregnant? This information is not intended to replace advice given to you by your health care provider.  Make sure you discuss any questions you have with your health care provider. Document Released: 02/10/2015 Document Reviewed: 02/10/2015 Elsevier Interactive Patient Education  2017 [place].  ++++++++++++++++++++++++++++ Recommend Adult Low Dose Aspirin or  coated  Aspirin 81 mg daily  To reduce risk of Colon Cancer 20 %,  Skin Cancer 26 % ,  Melanoma 46%  and  Pancreatic cancer 60% +++++++++++++++++++++++++ Vitamin D goal  is between 70-100.  Please make sure that you are taking your Vitamin D as directed.  It is very important as a natural anti-inflammatory  helping hair, skin, and nails, as well as reducing stroke and heart attack risk.  It helps your bones and helps with mood. It also decreases numerous cancer risks so please take it as directed.  Low Vit D is associated with a 200-300% higher risk for CANCER  and 200-300% higher risk for HEART   ATTACK  &  STROKE.   .....................................Marland Kitchen It is also associated with  higher death rate at younger ages,  autoimmune diseases like Rheumatoid arthritis, Lupus, Multiple Sclerosis.    Also many other serious conditions, like depression, Alzheimer's Dementia, infertility, muscle aches, fatigue, fibromyalgia - just to name a few. ++++++++++++++++++++ Recommend the book "The END of DIETING" by Dr Excell Seltzer  & the book "The END of DIABETES " by Dr Excell Seltzer At [email or web address] - get book & Audio CD's    Being diabetic has a  300% increased risk for heart attack, stroke, cancer, and alzheimer- type vascular dementia. It is very important that you work harder with diet by avoiding all foods that are white. Avoid white rice (brown & wild rice is OK), white potatoes (sweetpotatoes in moderation is OK), White bread or wheat bread or anything made out of white flour like bagels, donuts, rolls, buns, biscuits, cakes, pastries, cookies, pizza crust, and pasta (made from white flour & egg whites) - vegetarian pasta or spinach or  wheat pasta is OK. Multigrain breads like Arnold's or Pepper[employer], or multigrain sandwich thins or flatbreads.  Diet, exercise and weight loss can reverse and cure diabetes in the early stages.  Diet, exercise and weight loss is very important in the control and prevention of complications of diabetes which affects every system in your body, ie. Brain - dementia/stroke, eyes - glaucoma/blindness, heart - heart attack/heart failure, kidneys - dialysis, stomach - gastric paralysis, intestines - malabsorption, nerves - severe painful neuritis, circulation - gangrene & loss of a leg(s), and finally cancer and Alzheimers.    I recommend avoid fried & greasy foods,  sweets/candy, white rice (brown or wild rice or Quinoa is OK), white potatoes (sweet potatoes are OK) - anything made from white flour - bagels, doughnuts, rolls, buns, biscuits,white and wheat breads, pizza crust and traditional pasta made of white flour & egg white(vegetarian pasta or spinach or wheat pasta is OK).  Multi-grain bread is OK - like multi-grain flat bread or sandwich thins. Avoid alcohol in excess. Exercise is also important.    Eat all the vegetables you want - avoid meat, especially red meat and dairy - especially cheese.  Cheese is the most concentrated form of trans-fats which is the worst thing to clog up our arteries. Veggie cheese is OK which can be found in the fresh produce section at [employer] or [employer] or [employer]  +++++++++++++++++++++ DASH Eating Plan  DASH stands for "Dietary Approaches to Stop Hypertension."   The DASH eating plan is a healthy eating plan that has been shown to reduce high blood pressure (hypertension). Additional health benefits may include reducing the risk of type 2 diabetes mellitus, heart disease, and stroke. The DASH eating plan may also help with weight loss. WHAT DO I NEED TO KNOW ABOUT THE DASH EATING PLAN? For the DASH eating plan, you will follow these general  guidelines:  Choose foods with a percent daily value for sodium of less than 5% (as listed on the food label).  Use salt-free seasonings or herbs instead of table salt or sea salt.  Check with your health care provider or pharmacist before using salt substitutes.  Eat lower-sodium products, often labeled as "lower sodium" or "no salt added."  Eat fresh foods.  Eat more vegetables, fruits, and low-fat dairy products.  Choose whole grains. Look for the word "whole" as the first word in the ingredient list.  Choose fish   Limit sweets, desserts, sugars, and sugary drinks.  Choose heart-healthy fats.  Eat veggie cheese  Eat more home-cooked food and less restaurant, buffet, and fast food.  Limit fried foods.  Cook foods using methods other than frying.  Limit canned vegetables. If you do use them, rinse them well to decrease the sodium.  When eating at a restaurant, ask that your food be prepared with less salt, or no salt if possible.                      WHAT FOODS CAN I EAT? Read Dr Fara Olden Fuhrman's books on The End of Dieting & The End of Diabetes  Grains Whole grain or whole wheat bread. Brown rice. Whole grain or whole wheat pasta. Quinoa, bulgur, and whole grain cereals. Low-sodium cereals. Corn or whole wheat flour tortillas. Whole grain cornbread. Whole grain crackers. Low-sodium crackers.  Vegetables Fresh or frozen vegetables (raw, steamed, roasted, or grilled). Low-sodium or reduced-sodium tomato and vegetable juices. Low-sodium or reduced-sodium tomato sauce and paste. Low-sodium or reduced-sodium canned vegetables.   Fruits All fresh, canned (in natural juice), or frozen fruits.  Protein Products  All fish and seafood.  Dried beans, peas, or lentils. Unsalted nuts and seeds. Unsalted canned beans.  Dairy Low-fat dairy products, such as skim or 1% milk, 2% or reduced-fat cheeses, low-fat ricotta or cottage cheese, or plain low-fat yogurt. Low-sodium or  reduced-sodium cheeses.  Fats and Oils Tub margarines without trans fats. Light or reduced-fat mayonnaise and salad dressings (reduced sodium). Avocado. Safflower, olive, or canola oils. Natural peanut or almond butter.  Other Unsalted popcorn and pretzels. The items listed above may not be a complete list of recommended foods or beverages. Contact your dietitian for more options.  +++++++++++++++  WHAT FOODS ARE NOT RECOMMENDED? Grains/ White flour or wheat flour White bread. White pasta. White rice. Refined cornbread. Bagels and croissants. Crackers that contain trans fat.  Vegetables  Creamed or fried vegetables. Vegetables in a . Regular canned vegetables. Regular canned tomato sauce and paste. Regular tomato and vegetable juices.  Fruits Dried fruits. Canned fruit in light or heavy syrup. Fruit juice.  Meat and Other Protein Products Meat in general - RED meat & White meat.  Fatty cuts of meat. Ribs, chicken wings, all processed meats as bacon, sausage, bologna, salami, fatback, hot dogs, bratwurst and packaged luncheon meats.  Dairy Whole or 2% milk, cream, half-and-half, and cream cheese. Whole-fat or sweetened yogurt. Full-fat cheeses or blue cheese. Non-dairy creamers and whipped toppings. Processed cheese, cheese spreads, or cheese curds.  Condiments Onion and garlic salt, seasoned salt, table salt, and sea salt. Canned and packaged gravies. Worcestershire sauce. Tartar sauce. Barbecue sauce. Teriyaki sauce. Soy sauce, including reduced sodium. Steak sauce. Fish sauce. Oyster sauce. Cocktail sauce. Horseradish. Ketchup and mustard. Meat flavorings and tenderizers. Bouillon cubes. Hot sauce. Tabasco sauce. Marinades. Taco seasonings. Relishes.  Fats and Oils Butter, stick margarine, lard, shortening and bacon fat. Coconut, palm kernel, or palm oils. Regular salad dressings.  Pickles and olives. Salted popcorn and pretzels.  The items listed above may not be a complete  list of foods and beverages to avoid.

## 2017-09-30 LAB — COMPLETE METABOLIC PANEL WITH GFR
AG RATIO: 1.9 (calc) (ref 1.0–2.5)
ALKALINE PHOSPHATASE (APISO): 37 U/L (ref 33–130)
ALT: 21 U/L (ref 6–29)
AST: 16 U/L (ref 10–35)
Albumin: 4.4 g/dL (ref 3.6–5.1)
BILIRUBIN TOTAL: 0.5 mg/dL (ref 0.2–1.2)
BUN/Creatinine Ratio: 18 (calc) (ref 6–22)
BUN: 24 mg/dL (ref 7–25)
CO2: 26 mmol/L (ref 20–32)
Calcium: 10 mg/dL (ref 8.6–10.4)
Chloride: 97 mmol/L — ABNORMAL LOW (ref 98–110)
Creat: 1.36 mg/dL — ABNORMAL HIGH (ref 0.60–0.93)
GFR, Est African American: 44 mL/min/{1.73_m2} — ABNORMAL LOW (ref >=60)
GFR, Est Non African American: 38 mL/min/{1.73_m2} — ABNORMAL LOW (ref >=60)
GLUCOSE: 113 mg/dL — AB (ref 65–99)
Globulin: 2.3 g/dL (calc) (ref 1.9–3.7)
POTASSIUM: 4.5 mmol/L (ref 3.5–5.3)
Sodium: 139 mmol/L (ref 135–146)
TOTAL PROTEIN: 6.7 g/dL (ref 6.1–8.1)

## 2017-09-30 LAB — VITAMIN D 25 HYDROXY (VIT D DEFICIENCY, FRACTURES): VIT D 25 HYDROXY: 71 ng/mL (ref 30–100)

## 2017-09-30 LAB — HEMOGLOBIN A1C
Hgb A1c MFr Bld: 6 % of total Hgb — ABNORMAL HIGH (ref <5.7)
Mean Plasma Glucose: 126 (calc)
eAG (mmol/L): 7 (calc)

## 2017-09-30 LAB — LIPID PANEL
CHOLESTEROL: 172 mg/dL (ref <200)
HDL: 43 mg/dL — ABNORMAL LOW (ref >50)
LDL Cholesterol (Calc): 103 mg/dL (calc) — ABNORMAL HIGH
NON-HDL CHOLESTEROL (CALC): 129 mg/dL (ref <130)
TRIGLYCERIDES: 159 mg/dL — AB (ref <150)
Total CHOL/HDL Ratio: 4 (calc) (ref <5.0)

## 2017-09-30 LAB — CBC WITH DIFFERENTIAL/PLATELET
BASOS PCT: 0.1 %
Basophils Absolute: 8 cells/uL (ref 0–200)
EOS ABS: 8 {cells}/uL — AB (ref 15–500)
Eosinophils Relative: 0.1 %
HCT: 45.6 % — ABNORMAL HIGH (ref 35.0–45.0)
Hemoglobin: 15.3 g/dL (ref 11.7–15.5)
Lymphs Abs: 1455 cells/uL (ref 850–3900)
MCH: 29.7 pg (ref 27.0–33.0)
MCHC: 33.6 g/dL (ref 32.0–36.0)
MCV: 88.4 fL (ref 80.0–100.0)
MONOS PCT: 8.1 %
MPV: 10.4 fL (ref 7.5–12.5)
NEUTROS PCT: 72.3 %
Neutro Abs: 5423 cells/uL (ref 1500–7800)
PLATELETS: 268 10*3/uL (ref 140–400)
RBC: 5.16 10*6/uL — ABNORMAL HIGH (ref 3.80–5.10)
RDW: 14.3 % (ref 11.0–15.0)
TOTAL LYMPHOCYTE: 19.4 %
WBC mixed population: 608 cells/uL (ref 200–950)
WBC: 7.5 10*3/uL (ref 3.8–10.8)

## 2017-09-30 LAB — DIGOXIN LEVEL: DIGOXIN LVL: 1.1 ug/L (ref 0.8–2.0)

## 2017-09-30 LAB — MAGNESIUM: MAGNESIUM: 2.3 mg/dL (ref 1.5–2.5)

## 2017-09-30 LAB — PROTIME-INR
INR: 1.7 — ABNORMAL HIGH
PROTHROMBIN TIME: 18.1 s — AB (ref 9.0–11.5)

## 2017-09-30 LAB — TSH: TSH: 1.72 m[IU]/L (ref 0.40–4.50)

## 2017-09-30 LAB — INSULIN, RANDOM: INSULIN: 43.6 u[IU]/mL — AB (ref 2.0–19.6)

## 2017-10-01 ENCOUNTER — Encounter: Payer: Self-pay | Admitting: Internal Medicine

## 2017-10-05 ENCOUNTER — Other Ambulatory Visit: Payer: Self-pay | Admitting: Adult Health

## 2017-10-09 ENCOUNTER — Other Ambulatory Visit: Payer: Self-pay | Admitting: Internal Medicine

## 2017-10-10 ENCOUNTER — Ambulatory Visit: Payer: Medicare Other | Admitting: Internal Medicine

## 2017-10-10 ENCOUNTER — Encounter: Payer: Self-pay | Admitting: Internal Medicine

## 2017-10-10 VITALS — BP 118/68 | HR 84 | Ht 63.0 in | Wt 205.0 lb

## 2017-10-10 DIAGNOSIS — R059 Cough, unspecified: Secondary | ICD-10-CM

## 2017-10-10 DIAGNOSIS — R05 Cough: Secondary | ICD-10-CM

## 2017-10-10 DIAGNOSIS — J8410 Pulmonary fibrosis, unspecified: Secondary | ICD-10-CM

## 2017-10-10 DIAGNOSIS — J449 Chronic obstructive pulmonary disease, unspecified: Secondary | ICD-10-CM

## 2017-10-10 NOTE — Patient Instructions (Addendum)
Bevespi Take 2 puffs first thing in am and then another 2 puffs about 12 hours later.   Work on inhaler technique:  relax and gently blow all the way out then take a nice smooth deep breath back in, triggering the inhaler at same time you start breathing in.  Hold for up to 5 seconds if you can. Blow out thru nose. Rinse and gargle with water when done    Only use your albuterol (proair) as a rescue medication to be used if you can't catch your breath by resting or doing a relaxed purse lip breathing pattern.  - The less you use it, the better it will work when you need it. - Ok to use up to 1-2 puffs  every 4 hours if you must but call for immediate appointment if use goes up over your usual need - Don't leave home without it !!  (think of it like the spare tire for your car)    Please see patient coordinator before you leave today  to schedule sinus Ct and Chest chest     If you are satisfied with your treatment plan,  let your doctor know and he/she can either refill your medications or you can return here when your prescription runs out.     If in any way you are not 100% satisfied,  please tell us.  If 100% better, tell your friends!  Pulmonary follow up is as needed

## 2017-10-10 NOTE — Progress Notes (Signed)
Subjective:     Patient ID: Katie [name], female   DOB: 06/02/1940,    MRN: [id number]    Brief patient profile:  82 yowf quit smoking  2004 with variable sob but on best days can do housework  With no copd as of last pfts 2017 referred back to pulmonary clinic 07/11/2017 p admit:   Admit date: 05/07/2017 Discharge date: 05/10/2017   Brief/Interim Summary: For full details see H&P/Progress note, but in brief, Katie [name] is a 77 year old female with medical history of A. fib, CHF, hypertension, CKD, COPD, hypothyroidism and history of migraine who presented to the emergency department with cough,associated with shortness of breath, confusion and lethargy.In the ER he was found to be in A. fib with RVR and hypotensive shock as well febrile with imperative of 102.3. Influenza A positive. Patient was started on IV fluids and empiric antibiotic and she was admitted to the ICU due to septic shock not responding to IV fluid. Subsequently started on levophed,which eventually was weaned off, and patient blood pressure was normalized. Patient also received Cardizem drip for a short period time due to hypotension.  Patient breathing significantly improved, A. fib is well controlled and confusion has resolved.  Renal function has come back to baseline.  This point patient remains afebrile, receiving Tamiflu and is being clinically stable for discharge.  Subjective: Patient seen and examined, report feeling weak, PT has evaluated patient and recommended home health PT, complaining of having difficulty with swallowing.  Patient has a history of hiatal hernia, swallow eval was done suspecting esophageal dysphagia.  Discharge Diagnoses/Hospital Course:  Septic shock due to influenza A / Acute respiratory failure with hypoxia  Continue Tamiflu for 2 more days Blood cultures no growth so far Was treated with pressor for short period of time  Patient was initially treated with vancomycin and Zosyn no  signs of bacterial infection we will discontinue antibiotics. Will send on Prednisone taper for 14 days  Successfully weaned of oxigen Follow up with PCP   Acute metabolic encephalopathy Due to infectious process and hypoxia  Resolved Continue to treat underlying causes  Chronic A. fib with RVR - stable Due to infectious process Initially treated with Cardizem drip which has been transitioned to oral diltiazem.  Home medications were continued including Cardizem and digoxin. Anticoagulation with Coumadin which was held due to elevated INR Will hold Coumadin tonight and resume home medication tomorrow.  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 Check BMP in 1 week  Chronic diastolic CHF No signs of fluid overload Echocardiogram with EF 45%, no significant changes from previous Follow-up with PCP  Hypertension BP stable Continue home regimen without any changes   All other chronic medical condition were stable during the hospitalization.  Patient was seen by physical therapy, recommending home health PT On the day of the discharge the patient's vitals were stable, and no other acute medical condition were reported by patient. the patient was felt safe to be discharge to home        07/11/2017  f/u ov/Katie [name] re: ? ILD s/p sepsis  Chief Complaint  Patient presents with  . Follow-up    Breathing has improved some, but not back to her normal baseline. She has been using proair and atrovent neb at least 2 x daily.    Dyspnea:  Main limit = can no longer do  yard work/ mopping more than two rooms but really more limited by knees than breathing Cough: none  Sleep: 2 pillows  trelegy each  am  X  2weeks no real change in any symptoms she's aware of and still over using saba/nebs per hosp d/c  instructions rec Keep walking and let me know if there is a pattern of lower 02 levels when you walk  We will arrange for another High resolution ct chest after your next visit  Please remember to go to the lab department downstairs in the basement  for your tests - we will call you with the results when they are available. Please schedule a follow up visit in 3 months but call sooner if needed  with all medications /inhalers/ solutions in hand     10/10/2017  f/u ov/Katie [name] re:  ILD ? Copd component maint on bevespi at pt's req Chief Complaint  Patient presents with  . Follow-up    Breathing is unchanged. She is using her xopenex 2 x per wk on average.   Dyspnea:  Can mop now/ [place] beach walked slower than others and 02 sats are fine = MMRC2 = can't walk a nl pace on a flat grade s sob but does fine slow and flat    Cough: none  SABA use: rarely / no neb  But only uses bevesopi one bid 02: no    No obvious day to day or daytime variability or assoc excess/ purulent sputum or mucus plugs or hemoptysis or cp or chest tightness, subjective wheeze or overt  hb symptoms.   Sleeping: 2pillows/ flat without nocturnal  or early am exacerbation  of respiratory  c/o's or need for noct saba. Also denies any obvious fluctuation of symptoms with weather or environmental changes or other aggravating or alleviating factors except as outlined above   No unusual exposure hx or h/o childhood pna/ asthma or knowledge of premature birth.  Current Allergies, Complete Past Medical History, Past Surgical History, Family History, and Social History were reviewed in [employer] record.  ROS  The following are not active complaints unless bolded Hoarseness, sore throat, dysphagia, dental problems, itching, sneezing,  nasal congestion or discharge of excess mucus or purulent secretions, ear ache,   fever, chills, sweats, unintended wt loss or wt gain, classically pleuritic or exertional cp,   orthopnea pnd or arm/hand swelling  or leg swelling, presyncope, palpitations, abdominal pain, anorexia, nausea, vomiting, diarrhea  or change in bowel habits or change in bladder habits, change in stools or change in urine, dysuria, hematuria,  rash, arthralgias, visual complaints, headache, numbness, weakness or ataxia or problems with walking or coordination,  change in mood or  memory.        Current Meds  Medication Sig  . acetaminophen (TYLENOL) 500 MG tablet Take 1,000 mg by mouth every 6 (six) hours as needed for moderate pain.   Marland Kitchen albuterol (PROAIR HFA) 108 (90 Base) MCG/ACT inhaler 2 inhalations 10-15 minutes apart evert 4 hours as needed for Asthma Flare  . allopurinol (ZYLOPRIM) 300 MG tablet Take 300 mg by mouth daily.  Marland Kitchen aspirin EC 81 MG tablet Take 81 mg by mouth daily.  Marland Kitchen buPROPion (WELLBUTRIN XL) 150 MG 24 hr tablet TAKE 1 TABLET BY MOUTH EVERY DAY  . Cholecalciferol (VITAMIN D3) 5000 units CAPS Takes 2 caps (10,000 units) daily (Patient taking differently: Take 5,000 Units by mouth daily. )  . digoxin (LANOXIN)  0.125 MG tablet Take 1 tablet daily for Heart Rhythm (Patient taking differently: Take 0.125 mg by mouth daily. Take 1 tablet daily for Heart Rhythm)  . diltiazem (TIAZAC) 120 MG 24 hr capsule TAKE 1 CAPSULE BY MOUTH EVERY DAY  . gabapentin (NEURONTIN) 600 MG tablet TAKE 1/2 TO 1 TABLET BY MOUTH 3 TIMES DAILY.  Marland Kitchen Glycopyrrolate-Formoterol 9-4.8 MCG/ACT AERO Inhale 1 puff into the lungs daily.  Marland Kitchen ipratropium (ATROVENT) 0.02 % nebulizer solution Take 2.5 mLs (0.5 mg total) by nebulization every 4 (four) hours as needed for wheezing or shortness of breath.  Marland Kitchen ipratropium (ATROVENT) 0.06 % nasal spray Use 1 to 2 sprays each Nostril 2 to 3 x / day as needed  . levalbuterol (XOPENEX HFA) 45 MCG/ACT inhaler Inhale 2 puffs into the lungs every 4 (four) hours as needed for wheezing.  Marland Kitchen levothyroxine (SYNTHROID, LEVOTHROID) 50 MCG tablet Take 1 & 1/2 tablets daily or as directed  (Patient taking differently: Take 25 mcg by mouth daily before breakfast. )  . montelukast (SINGULAIR) 10 MG tablet TAKE 1 TABLET EVERY DAY FOR ALLERGIES  . Olopatadine HCl 0.2 % SOLN Place 1 drop into both eyes daily.   . pantoprazole (PROTONIX) 40 MG tablet Take 1 tablet (40 mg total) by mouth daily.  [name] Glycol-Propyl Glycol (SYSTANE OP) Apply 1 drop to eye 4 (four) times daily.   . potassium chloride SA (KLOR-CON M20) 20 MEQ tablet TAKE 2 TABLETS (40 MEQ TOTAL) BY MOUTH DAILY. (Patient taking differently: Take 40 mEq by mouth daily. )  . pravastatin (PRAVACHOL) 40 MG tablet Take 40 mg by mouth daily.  . prednisoLONE acetate (PRED FORTE) 1 % ophthalmic suspension INSTILL 1 DROP INTO BOTH EYES FOUR TIMES A DAY FOR 10 DAYS AND THEN TWICE DAY.  Marland Kitchen prochlorperazine (COMPAZINE) 5 MG tablet TAKE 1 TABLET BY MOUTH THREE TIMES A DAY AS NEEDED FOR VERTIGO OR NAUSEA  . torsemide (DEMADEX) 20 MG tablet Take 2 tablets (40 mg total) by mouth 2 (two) times daily.  Marland Kitchen triamcinolone cream (KENALOG) 0.1 % Apply 1 application topically at bedtime.  Marland Kitchen warfarin (COUMADIN) 3 MG tablet TAKE 1-2 TABLETS DAILY AS DIRECTED (Patient taking differently: Take 2-3 mg by mouth See admin instructions. 2mg  on Sun/Mon/Tues/Thurs/Fri/Sat and 3mg  on Wed)                 Objective:   Physical Exam  amb mod obese wf nad   10/10/2017      205 07/11/2017       202   02/16/2016      200  01/07/16 198 lb (89.8 kg)  01/02/16 197 lb 12.8 oz (89.7 kg)  12/30/15 201 lb 14.4 oz (91.6 kg)    Vital signs reviewed - Note on arrival 02 sats  93% on  RA    HEENT: full dentures/ nl turbinates bilaterally, and oropharynx. Nl external ear canals without cough reflex   NECK :  without JVD/Nodes/TM/ nl carotid upstrokes bilaterally   LUNGS: no acc muscle use,  Nl contour chest which is clear to A and P bilaterally without cough on insp or exp maneuvers   CV:  RRR  no s3 or murmur or increase in P2, and trace to 1+  pitting both lower ext   ABD:  soft and nontender with nl inspiratory excursion in the supine position. No bruits or organomegaly appreciated, bowel sounds nl  MS:  Nl gait/ ext warm without deformities, calf tenderness, cyanosis or clubbing No obvious  joint restrictions   SKIN: warm and dry without lesions    NEURO:  alert, approp, nl sensorium with  no motor or cerebellar deficits apparent.

## 2017-10-11 ENCOUNTER — Encounter: Payer: Self-pay | Admitting: Internal Medicine

## 2017-10-11 NOTE — Assessment & Plan Note (Addendum)
Quit smoking 2004 PFT's  07/04/2017  FEV1 1/61 (76 % ) ratio 80  p no % improvement from saba p ? prior to study with DLCO  44 % corrects to 62 % for alv volume  With mild curvature    She does not meet the criteria for copd on pft but has done better on bevespi despite poor baseline hfa and only using one bid    rec  Work on Educational psychologist hfa - - The proper method of use, as well as anticipated side effects, of a metered-dose inhaler are discussed and demonstrated to the patient  - increase bevespi to 2bid   I had an extended discussion with the patient reviewing all relevant studies completed to date and  lasting 15 to 20 minutes of a 25 minute visit    See device teaching which extended face to face time for this visit.  Each maintenance medication was reviewed in detail including emphasizing most importantly the difference between maintenance and prns and under what circumstances the prns are to be triggered using an action plan format that is not reflected in the computer generated alphabetically organized AVS which I have not found useful in most complex patients, especially with respiratory illnesses  Please see AVS for specific instructions unique to this visit that I personally wrote and verbalized to the the pt in detail and then reviewed with pt  by my nurse highlighting any  changes in therapy recommended at today's visit to their plan of care.

## 2017-10-11 NOTE — Assessment & Plan Note (Addendum)
Last exp to amiodarone ? 2014  - Quant Ig's 07/11/17 neg - ESR 07/11/17 :  49  - Quant TB 07/11/17 :  Neg  - Collagen vasc screen 07/11/17 > pos ana 1:40 homogenous  - HSP serology 07/11/17 neg  - Repeat HRCT ordered 10/10/2017 > if any progression needs pfts and repeat ana titers with ani dna as well and consideration for rheum eval    Discussed in detail all the  indications, usual  risks and alternatives  relative to the benefits with patient who agrees to proceed with w/u as outlined.

## 2017-10-12 ENCOUNTER — Telehealth (INDEPENDENT_AMBULATORY_CARE_PROVIDER_SITE_OTHER): Payer: Self-pay | Admitting: Orthopaedic Surgery

## 2017-10-12 NOTE — Telephone Encounter (Signed)
Patient left a vm message wanting to talk to you.  CB#[phone].  Thank you.

## 2017-10-13 ENCOUNTER — Other Ambulatory Visit (INDEPENDENT_AMBULATORY_CARE_PROVIDER_SITE_OTHER): Payer: Self-pay

## 2017-10-13 DIAGNOSIS — M4807 Spinal stenosis, lumbosacral region: Secondary | ICD-10-CM

## 2017-10-13 NOTE — Telephone Encounter (Signed)
LMOM for patient that I was calling her back

## 2017-10-17 ENCOUNTER — Ambulatory Visit (INDEPENDENT_AMBULATORY_CARE_PROVIDER_SITE_OTHER): Payer: Medicare Other | Admitting: Internal Medicine

## 2017-10-17 ENCOUNTER — Encounter: Payer: Self-pay | Admitting: Internal Medicine

## 2017-10-17 VITALS — BP 112/66 | HR 84 | Temp 97.0°F | Resp 24 | Ht 63.0 in | Wt 202.8 lb

## 2017-10-17 DIAGNOSIS — I447 Left bundle-branch block, unspecified: Secondary | ICD-10-CM | POA: Diagnosis not present

## 2017-10-17 DIAGNOSIS — R0789 Other chest pain: Secondary | ICD-10-CM

## 2017-10-17 DIAGNOSIS — J439 Emphysema, unspecified: Secondary | ICD-10-CM | POA: Diagnosis not present

## 2017-10-17 DIAGNOSIS — H6123 Impacted cerumen, bilateral: Secondary | ICD-10-CM | POA: Diagnosis not present

## 2017-10-17 DIAGNOSIS — I482 Chronic atrial fibrillation, unspecified: Secondary | ICD-10-CM

## 2017-10-17 DIAGNOSIS — I700 Atherosclerosis of aorta: Secondary | ICD-10-CM | POA: Insufficient documentation

## 2017-10-17 NOTE — Progress Notes (Signed)
Subjective:    Patient ID: Katie [name], female    DOB: March 16, 1940, 77 y.o.   MRN: [id number]  HPI   This very nice 77 yo MWF is being followed by Dr Melvyn Novas for Pulmonary Fibrosis and has upcoming Chest CT to evaluate to progression of interstitial changes. Patient has 39 yr smoking hx/o 1&1/2 -2 ppd quitting in 2005. She has cAfib and had been on Amiodarone in the past. She presents today with c/o  intermittant  non-exertional sensation of chest pressure and dyspnea over the last 3 days. She also admits worsening DOE over the last week. No cough or sputum. Says home O2 sat's are >90 %. She in on Bevespi Sx's  x 2 inhal daily and feels no benefit. She would like to try Spiriva again.     She also c/o "ears feel full/stopped up" with decreased hearing  Medication Sig  . acetaminophen (TYLENOL) 500 MG tablet Take 1,000 mg by mouth every 6 (six) hours as needed for moderate pain.   Marland Kitchen albuterol (PROAIR HFA) 108 (90 Base) MCG/ACT inhaler 2 inhalations 10-15 minutes apart evert 4 hours as needed for Asthma Flare  . allopurinol (ZYLOPRIM) 300 MG tablet Take 300 mg by mouth daily.  Marland Kitchen aspirin EC 81 MG tablet Take 81 mg by mouth daily.  Marland Kitchen buPROPion (WELLBUTRIN XL) 150 MG 24 hr tablet TAKE 1 TABLET BY MOUTH EVERY DAY  . Cholecalciferol (VITAMIN D3) 5000 units CAPS Takes 2 caps (10,000 units) daily (Patient taking differently: Take 5,000 Units by mouth daily. )  . digoxin (LANOXIN) 0.125 MG tablet Take 1 tablet daily for Heart Rhythm (Patient taking differently: Take 0.125 mg by mouth daily. Take 1 tablet daily for Heart Rhythm)  . diltiazem (TIAZAC) 120 MG 24 hr capsule TAKE 1 CAPSULE BY MOUTH EVERY DAY  . gabapentin (NEURONTIN) 600 MG tablet TAKE 1/2 TO 1 TABLET BY MOUTH 3 TIMES DAILY.  Marland Kitchen Glycopyrrolate-Formoterol 9-4.8 MCG/ACT AERO Inhale 1 puff into the lungs daily.  Marland Kitchen ipratropium (ATROVENT) 0.02 % nebulizer solution Take 2.5 mLs (0.5 mg total) by nebulization every 4 (four) hours as needed for wheezing  or shortness of breath.  Marland Kitchen ipratropium (ATROVENT) 0.06 % nasal spray Use 1 to 2 sprays each Nostril 2 to 3 x / day as needed  . levalbuterol (XOPENEX HFA) 45 MCG/ACT inhaler Inhale 2 puffs into the lungs every 4 (four) hours as needed for wheezing.  Marland Kitchen levothyroxine (SYNTHROID, LEVOTHROID) 50 MCG tablet Take 1 & 1/2 tablets daily or as directed (Patient taking differently: Take 25 mcg by mouth daily before breakfast. )  . montelukast (SINGULAIR) 10 MG tablet TAKE 1 TABLET EVERY DAY FOR ALLERGIES  . Olopatadine HCl 0.2 % SOLN Place 1 drop into both eyes daily.   . pantoprazole (PROTONIX) 40 MG tablet Take 1 tablet (40 mg total) by mouth daily.  [name] Glycol-Propyl Glycol (SYSTANE OP) Apply 1 drop to eye 4 (four) times daily.   . potassium chloride SA (KLOR-CON M20) 20 MEQ tablet TAKE 2 TABLETS (40 MEQ TOTAL) BY MOUTH DAILY. (Patient taking differently: Take 40 mEq by mouth daily. )  . pravastatin (PRAVACHOL) 40 MG tablet Take 40 mg by mouth daily.  . prednisoLONE acetate (PRED FORTE) 1 % ophthalmic suspension INSTILL 1 DROP INTO BOTH EYES FOUR TIMES A DAY FOR 10 DAYS AND THEN TWICE DAY.  Marland Kitchen prochlorperazine (COMPAZINE) 5 MG tablet TAKE 1 TABLET BY MOUTH THREE TIMES A DAY AS NEEDED FOR VERTIGO OR NAUSEA  . torsemide (  DEMADEX) 20 MG tablet Take 2 tablets (40 mg total) by mouth 2 (two) times daily.  Marland Kitchen triamcinolone cream (KENALOG) 0.1 % Apply 1 application topically at bedtime.  Marland Kitchen warfarin (COUMADIN) 3 MG tablet TAKE 1-2 TABLETS DAILY AS DIRECTED (Patient taking differently: Take 2-3 mg by mouth See admin instructions. 2mg  on Sun/Mon/Tues/Thurs/Fri/Sat and 3mg  on Wed)   No facility-administered medications prior to visit.    Allergies  Allergen Reactions  . Amiodarone Other (See Comments)    PULMONARY TOXICITY  . Diovan [Valsartan] Other (See Comments)    HYPOTENSION  . Doxycycline Diarrhea and Other (See Comments)    VISUAL DISTURBANCE  . Flexeril [Cyclobenzaprine] Other (See Comments)     FATIGUE  . Keflex [Cephalexin] Diarrhea  . Verapamil Other (See Comments)    EDEMA  . Codeine Other (See Comments)    unknown    Past Medical History:  Diagnosis Date  . Atrial fibrillation ([place])   . Migraine headache   . Osteopenia    Past Surgical History:  Procedure Laterality Date  . ABDOMINAL AORTIC ENDOVASCULAR STENT GRAFT N/A 04/11/2013   Procedure: ABDOMINAL AORTIC ENDOVASCULAR STENT GRAFT WITH RIGHT FEMORAL PATCH ANGIOPLASTY;  Surgeon: Mal Misty, MD;  Location: [place];  Service: Vascular;  Laterality: N/A;  . BREAST SURGERY     LEFT BREAST BIOPSY  . broken leg Left 1970s  . CARDIAC CATHETERIZATION    . CHOLECYSTECTOMY  1972  . COLONOSCOPY    . EYE SURGERY Bilateral   . Laser vein procedure    . REFRACTIVE SURGERY    . TONSILLECTOMY     Review of Systems    10 point systems review negative except as above.    Objective:   Physical Exam  BP 112/66   Pulse 84   Temp (!) 97 F (36.1 C)   Resp (!) 24   Ht 5\' 3"  (1.6 m)   Wt 202 lb 12.8 oz (92 kg)   BMI 35.92 kg/m   O2 sat 92% at rest   HEENT - WNL except bilat dense cerumen impaction. Neck - supple.  Chest -  BS decreased, but clear. Cor - Nl HS. Sl irreg RR w/o sig M. PP 1(+). With 1+ ankle edema. MS- FROM w/o deformities.  Gait Nl. Neuro -  Nl w/o focal abnormalities.  Both ears lavaged & cerumen extracted manually til clear and Tm's appeared normal.     Assessment & Plan:   1. Pulmonary emphysema, unspecified emphysema type ([place])   2. Chest pressure  - EKG 12-Lead  3. Chronic atrial fibrillation ([facility])  - EKG 12-Lead - Afb - ~ 90 b/m and fairly regular w/LBBB - NSC Since 2014  4. LBBB (left bundle branch block)  - EKG 12-Lead  5. Bilateral impacted cerumen  - Bilateral Lavage & ear wax removal

## 2017-10-18 ENCOUNTER — Other Ambulatory Visit: Payer: Self-pay | Admitting: Internal Medicine

## 2017-10-18 ENCOUNTER — Encounter: Payer: Self-pay | Admitting: Cardiology

## 2017-10-18 ENCOUNTER — Encounter: Payer: Self-pay | Admitting: *Deleted

## 2017-10-18 ENCOUNTER — Telehealth: Payer: Self-pay | Admitting: Internal Medicine

## 2017-10-18 ENCOUNTER — Ambulatory Visit: Payer: Medicare Other | Admitting: Cardiology

## 2017-10-18 ENCOUNTER — Ambulatory Visit: Payer: Self-pay | Admitting: Internal Medicine

## 2017-10-18 VITALS — BP 112/70 | HR 77 | Ht 63.0 in | Wt 204.8 lb

## 2017-10-18 DIAGNOSIS — R0609 Other forms of dyspnea: Secondary | ICD-10-CM

## 2017-10-18 DIAGNOSIS — I428 Other cardiomyopathies: Secondary | ICD-10-CM | POA: Diagnosis not present

## 2017-10-18 DIAGNOSIS — R079 Chest pain, unspecified: Secondary | ICD-10-CM

## 2017-10-18 DIAGNOSIS — I25118 Atherosclerotic heart disease of native coronary artery with other forms of angina pectoris: Secondary | ICD-10-CM

## 2017-10-18 NOTE — Patient Instructions (Addendum)
Medication Instructions:  Your physician has recommended you make the following change in your medication:  1. INCREASE your Lasix to 120 mg for the next 3 days.  Then return to normal daily dosing of 80 mg on Saturday.  * If you need a refill on your cardiac medications before your next appointment, please call your pharmacy.   Labwork: None ordered  Testing/Procedures: Your physician has requested that you have a lexiscan myoview. For further information please visit [email or web address]. Please follow instruction sheet, as given.  Follow-Up: Your physician recommends that you schedule a follow-up appointment in: 6 weeks with Dr. Lovena Le.  *Please note that any paperwork needing to be filled out by the provider will need to be addressed at the front desk prior to seeing the provider. Please note that any FMLA, disability or other documents regarding health condition is subject to a $25.00 charge that must be received prior to completion of paperwork in the form of a money order or check.  Thank you for choosing [facility]!!   Trinidad Curet, RN [phone]  Any Other Special Instructions Will Be Listed Below (If Applicable).  Cardiac Nuclear Scan A cardiac nuclear scan is a test that measures blood flow to the heart when a person is resting and when he or she is exercising. The test looks for problems such as:  Not enough blood reaching a portion of the heart.  The heart muscle not working normally.  You may need this test if:  You have heart disease.  You have had abnormal lab results.  You have had heart surgery or angioplasty.  You have chest pain.  You have shortness of breath.  In this test, a radioactive dye (tracer) is injected into your bloodstream. After the tracer has traveled to your heart, an imaging device is used to measure how much of the tracer is absorbed by or distributed to various areas of your heart. This procedure is usually done at a hospital  and takes 2-4 hours. Tell a health care provider about:  Any allergies you have.  All medicines you are taking, including vitamins, herbs, eye drops, creams, and over-the-counter medicines.  Any problems you or family members have had with the use of anesthetic medicines.  Any blood disorders you have.  Any surgeries you have had.  Any medical conditions you have.  Whether you are pregnant or may be pregnant. What are the risks? Generally, this is a safe procedure. However, problems may occur, including:  Serious chest pain and heart attack. This is only a risk if the stress portion of the test is done.  Rapid heartbeat.  Sensation of warmth in your chest. This usually passes quickly.  What happens before the procedure?  Ask your health care provider about changing or stopping your regular medicines. This is especially important if you are taking diabetes medicines or blood thinners.  Remove your jewelry on the day of the procedure. What happens during the procedure?  An IV tube will be inserted into one of your veins.  Your health care provider will inject a small amount of radioactive tracer through the tube.  You will wait for 20-40 minutes while the tracer travels through your bloodstream.  Your heart activity will be monitored with an electrocardiogram (ECG).  You will lie down on an exam table.  Images of your heart will be taken for about 15-20 minutes.  You may be asked to exercise on a treadmill or stationary bike. While you exercise, your heart's  activity will be monitored with an ECG, and your blood pressure will be checked. If you are unable to exercise, you may be given a medicine to increase blood flow to parts of your heart.  When blood flow to your heart has peaked, a tracer will again be injected through the IV tube.  After 20-40 minutes, you will get back on the exam table and have more images taken of your heart.  When the procedure is over, your  IV tube will be removed. The procedure may vary among health care providers and hospitals. Depending on the type of tracer used, scans may need to be repeated 3-4 hours later. What happens after the procedure?  Unless your health care provider tells you otherwise, you may return to your normal schedule, including diet, activities, and medicines.  Unless your health care provider tells you otherwise, you may increase your fluid intake. This will help flush the contrast dye from your body. Drink enough fluid to keep your urine clear or pale yellow.  It is up to you to get your test results. Ask your health care provider, or the department that is doing the test, when your results will be ready. Summary  A cardiac nuclear scan measures the blood flow to the heart when a person is resting and when he or she is exercising.  You may need this test if you are at risk for heart disease.  Tell your health care provider if you are pregnant.  Unless your health care provider tells you otherwise, increase your fluid intake. This will help flush the contrast dye from your body. Drink enough fluid to keep your urine clear or pale yellow. This information is not intended to replace advice given to you by your health care provider. Make sure you discuss any questions you have with your health care provider. Document Released: 03/26/2004 Document Revised: 03/03/2016 Document Reviewed: 02/07/2013 Elsevier Interactive Patient Education  2017 [employer].

## 2017-10-18 NOTE — Telephone Encounter (Signed)
Call returned to Pt.  Per Pt she saw Dr. Melford Aase yesterday.  She has been having chest pressure for last 3-4 days and DOE.  Pt is audibly sob over the phone.  Pt has history of lung disease also.  Available DOD slot today at 11:30 am.  Pt states she can make it.

## 2017-10-18 NOTE — Progress Notes (Signed)
Electrophysiology Office Note   Date:  10/18/2017   ID:  Katie [name], DOB 1940-09-08, MRN [id number]  PCP:  [name], MD  Cardiologist:  Lovena Le Primary Electrophysiologist: Lovena Le    No chief complaint on file.    History of Present Illness: Katie [name] is a 77 y.o. female who is being seen today for the evaluation of CHF at the request of [name], MD. Presenting today for electrophysiology evaluation.  She has a history of chronic systolic heart failure, coronary disease, AAA status post repair, stage III CKD, pulmonary fibrosis, permanent atrial fibrillation, and hypertension.  She has been having intermittent nonexertional chest pressure and dyspnea over the last 3 days.  Her dyspnea on exertion has been worsening over the last week.  No cough or sputum.  Home oxygen saturations have been greater than 90%.  She describes pressure in her chest at various times.  Not initially associated with exertion.    Today, she denies symptoms of palpitations, orthopnea, PND, lower extremity edema, claudication, dizziness, presyncope, syncope, bleeding, or neurologic sequela. The patient is tolerating medications without difficulties.    Past Medical History:  Diagnosis Date  . Atrial fibrillation ([place])   . Migraine headache   . Osteopenia    Past Surgical History:  Procedure Laterality Date  . ABDOMINAL AORTIC ENDOVASCULAR STENT GRAFT N/A 04/11/2013   Procedure: ABDOMINAL AORTIC ENDOVASCULAR STENT GRAFT WITH RIGHT FEMORAL PATCH ANGIOPLASTY;  Surgeon: Mal Misty, MD;  Location: [facility];  Service: Vascular;  Laterality: N/A;  . BREAST SURGERY     LEFT BREAST BIOPSY  . broken leg Left 1970s  . CARDIAC CATHETERIZATION    . CHOLECYSTECTOMY  1972  . COLONOSCOPY    . EYE SURGERY Bilateral   . Laser vein procedure    . REFRACTIVE SURGERY    . TONSILLECTOMY       Current Outpatient Medications  Medication Sig Dispense Refill  . acetaminophen (TYLENOL) 500 MG  tablet Take 1,000 mg by mouth every 6 (six) hours as needed for moderate pain.     Marland Kitchen albuterol (PROAIR HFA) 108 (90 Base) MCG/ACT inhaler 2 inhalations 10-15 minutes apart evert 4 hours as needed for Asthma Flare 48 g 3  . allopurinol (ZYLOPRIM) 300 MG tablet Take 300 mg by mouth daily.    Marland Kitchen aspirin EC 81 MG tablet Take 81 mg by mouth daily.    Marland Kitchen buPROPion (WELLBUTRIN XL) 150 MG 24 hr tablet TAKE 1 TABLET BY MOUTH EVERY DAY 90 tablet 1  . Cholecalciferol (VITAMIN D3) 5000 units CAPS Take 2 capsules by mouth daily.    . digoxin (LANOXIN) 0.125 MG tablet Take 0.125 mg by mouth daily.    Marland Kitchen diltiazem (TIAZAC) 120 MG 24 hr capsule TAKE 1 CAPSULE BY MOUTH EVERY DAY 90 capsule 1  . gabapentin (NEURONTIN) 600 MG tablet TAKE 1/2 TO 1 TABLET BY MOUTH 3 TIMES DAILY. 270 tablet 1  . Glycopyrrolate-Formoterol 9-4.8 MCG/ACT AERO Inhale 1 puff into the lungs 2 (two) times daily.     Marland Kitchen ipratropium (ATROVENT) 0.02 % nebulizer solution Take 2.5 mLs (0.5 mg total) by nebulization every 4 (four) hours as needed for wheezing or shortness of breath. 75 mL 2  . ipratropium (ATROVENT) 0.06 % nasal spray Use 1 to 2 sprays each Nostril 2 to 3 x / day as needed 45 mL 3  . levalbuterol (XOPENEX HFA) 45 MCG/ACT inhaler Inhale 2 puffs into the lungs every 4 (four) hours as needed for wheezing.  1 Inhaler 3  . levothyroxine (SYNTHROID, LEVOTHROID) 50 MCG tablet Take 0.5 tablets (25 mcg total) by mouth daily before breakfast. 90 tablet 1  . montelukast (SINGULAIR) 10 MG tablet Take 10 mg by mouth at bedtime.    . Olopatadine HCl 0.2 % SOLN Place 1 drop into both eyes daily.     . pantoprazole (PROTONIX) 40 MG tablet Take 1 tablet (40 mg total) by mouth daily.    [name] Glycol-Propyl Glycol (SYSTANE OP) Apply 1 drop to eye 4 (four) times daily.     . potassium chloride SA (KLOR-CON M20) 20 MEQ tablet TAKE 2 TABLETS (40 MEQ TOTAL) BY MOUTH DAILY. 180 tablet 1  . pravastatin (PRAVACHOL) 40 MG tablet Take 40 mg by mouth daily.     . prednisoLONE acetate (PRED FORTE) 1 % ophthalmic suspension INSTILL 1 DROP INTO BOTH EYES FOUR TIMES A DAY FOR 10 DAYS AND THEN TWICE DAY.  1  . prochlorperazine (COMPAZINE) 5 MG tablet TAKE 1 TABLET BY MOUTH THREE TIMES A DAY AS NEEDED FOR VERTIGO OR NAUSEA 60 tablet 1  . torsemide (DEMADEX) 20 MG tablet Take 2 tablets (40 mg total) by mouth 2 (two) times daily. 180 tablet 3  . triamcinolone cream (KENALOG) 0.1 % Apply 1 application topically at bedtime.    Marland Kitchen warfarin (COUMADIN) 3 MG tablet TAKE 1-2 TABLETS DAILY AS DIRECTED (Patient taking differently: Take 2-3 mg by mouth See admin instructions. 2mg  on Sun/Mon/Tues/Thurs/Fri/Sat and 3mg  on Wed) 180 tablet 1   No current facility-administered medications for this visit.     Allergies:   Amiodarone; Diovan [valsartan]; Doxycycline; Flexeril [cyclobenzaprine]; Keflex [cephalexin]; Verapamil; and Codeine   Social History:  The patient  reports that she quit smoking about 15 years ago. Her smoking use included cigarettes. She has never used smokeless tobacco. She reports that she drinks alcohol. She reports that she does not use drugs.   Family History:  The patient's family history includes Alcohol abuse in her father; Breast cancer in her sister; Cancer (age of onset: 54) in her mother; Cirrhosis in her mother; Depression in her father; Heart defect in her sister; Heart disease in her daughter and sister; Hyperlipidemia in her son; Hypertension in her brother; Stroke in her sister.    ROS:  Please see the history of present illness.   Otherwise, review of systems is positive for as pressure, shortness of breath.   All other systems are reviewed and negative.    PHYSICAL EXAM: VS:  BP 112/70   Pulse 77   Ht 5\' 3"  (1.6 m)   Wt 204 lb 12.8 oz (92.9 kg)   SpO2 95%   BMI 36.28 kg/m  , BMI Body mass index is 36.28 kg/m. GEN: Well nourished, well developed, in no acute distress  HEENT: normal  Neck: no JVD, carotid bruits, or  masses Cardiac: RRR; 6 systolic and 1 out of 6 diastolic murmur at the base, no rubs, or gallops, 2+edema  Respiratory:  clear to auscultation bilaterally, normal work of breathing GI: soft, nontender, nondistended, + BS MS: no deformity or atrophy  Skin: warm and dry Neuro:  Strength and sensation are intact Psych: euthymic mood, full affect  EKG:  EKG is not ordered today. Personal review of the ekg ordered 8/5/19shows atrial fibrillation, left bundle branch block  Recent Labs: 06/23/2017: Pro B Natriuretic peptide (BNP) 208.0 09/29/2017: ALT 21; BUN 24; Creat 1.36; Hemoglobin 15.3; Magnesium 2.3; Platelets 268; Potassium 4.5; Sodium 139; TSH 1.72  Lipid Panel     Component Value Date/Time   CHOL 172 09/29/2017 0950   TRIG 159 (H) 09/29/2017 0950   HDL 43 (L) 09/29/2017 0950   CHOLHDL 4.0 09/29/2017 0950   VLDL 40 (H) 09/17/2016 1133   LDLCALC 103 (H) 09/29/2017 0950     Wt Readings from Last 3 Encounters:  10/18/17 204 lb 12.8 oz (92.9 kg)  10/17/17 202 lb 12.8 oz (92 kg)  10/10/17 205 lb (93 kg)      Other studies Reviewed: Additional studies/ records that were reviewed today include: TTE 05/08/17  Review of the above records today demonstrates:  - Left ventricle: The cavity size was normal. Wall thickness was   increased in a pattern of moderate LVH. The estimated ejection   fraction was 45%. Diffuse hypokinesis. The study was not   technically sufficient to allow evaluation of LV diastolic   dysfunction due to atrial fibrillation. - Ventricular septum: Septal motion showed abnormal function and   dyssynergy. These changes are consistent with a left bundle   branch block. - Aortic valve: Moderately calcified annulus. Trileaflet; mildly   calcified leaflets. There was mild stenosis. There was mild   regurgitation. Peak velocity (S): 251 cm/s. Mean gradient (S): 14   mm Hg. Valve area (VTI): 1.95 cm^2. - Aorta: Mild aortic root dilatation. - Mitral valve:  Calcified annulus. There was mild to moderate   regurgitation. Valve area by continuity equation (using LVOT   flow): 3.32 cm^2. - Right ventricle: Systolic function was mildly reduced. - Right atrium: The atrium was moderately dilated. - Tricuspid valve: There was mild-moderate regurgitation. - Pulmonary arteries: PA peak pressure: 66 mm Hg (S). - Inferior vena cava: The vessel was dilated. The respirophasic   diameter changes were blunted (< 50%), consistent with elevated   central venous pressure. CVP 15 mmHg.   ASSESSMENT AND PLAN:  1.  Chronic systolic heart failure: Early she is volume overloaded.  She is taking 80 mg of Lasix a day.  We Calena Salem increase this to 120 mg for the next 3 days.  2.  Coronary artery disease: Having chest pain that has gotten worse over the last few days.  This is worse than her prior episodes of chest pain.  Develle Sievers order a Myoview.  Current medicines are reviewed at length with the patient today.   The patient does not have concerns regarding her medicines.  The following changes were made today: Increase Lasix to 120 mg for 3 days  Labs/ tests ordered today include:  No orders of the defined types were placed in this encounter.    Disposition:   FU with Cristopher Peru 6 weeks  Signed, Omer Puccinelli Meredith Leeds, MD  10/18/2017 12:14 PM     [address] [place] [address] [phone] (office) [phone] (fax)

## 2017-10-18 NOTE — Telephone Encounter (Signed)
New problem    Pt saw Dr Melford Aase yesterday and need to speak to nurse concerning her diagnoses that she was told about. Please call pt.

## 2017-10-19 ENCOUNTER — Telehealth (HOSPITAL_COMMUNITY): Payer: Self-pay | Admitting: *Deleted

## 2017-10-19 ENCOUNTER — Telehealth: Payer: Self-pay | Admitting: *Deleted

## 2017-10-19 NOTE — Telephone Encounter (Signed)
Follow Up:      Returning your call from today. 

## 2017-10-19 NOTE — Telephone Encounter (Signed)
Left message on voicemail in reference to upcoming appointment scheduled for 10/20/17 Phone number given for a call back so details instructions can be given.  Katie [name]

## 2017-10-20 ENCOUNTER — Ambulatory Visit (INDEPENDENT_AMBULATORY_CARE_PROVIDER_SITE_OTHER)
Admission: RE | Admit: 2017-10-20 | Discharge: 2017-10-20 | Disposition: A | Discharge status: Home / Self Care | Payer: Medicare Other | Source: Ambulatory Visit | Attending: Internal Medicine | Admitting: Internal Medicine

## 2017-10-20 ENCOUNTER — Ambulatory Visit (HOSPITAL_COMMUNITY): Payer: Medicare Other | Attending: Cardiology

## 2017-10-20 DIAGNOSIS — I447 Left bundle-branch block, unspecified: Secondary | ICD-10-CM | POA: Insufficient documentation

## 2017-10-20 DIAGNOSIS — R05 Cough: Secondary | ICD-10-CM | POA: Diagnosis not present

## 2017-10-20 DIAGNOSIS — R079 Chest pain, unspecified: Secondary | ICD-10-CM | POA: Diagnosis not present

## 2017-10-20 DIAGNOSIS — R059 Cough, unspecified: Secondary | ICD-10-CM

## 2017-10-20 DIAGNOSIS — J8410 Pulmonary fibrosis, unspecified: Secondary | ICD-10-CM | POA: Diagnosis not present

## 2017-10-20 DIAGNOSIS — R0602 Shortness of breath: Secondary | ICD-10-CM | POA: Diagnosis not present

## 2017-10-20 LAB — MYOCARDIAL PERFUSION IMAGING
CHL CUP NUCLEAR SDS: 3
CHL CUP NUCLEAR SSS: 7
LHR: 0.33
LV dias vol: 153 mL (ref 46–106)
LV sys vol: 101 mL
NUC STRESS TID: 1.02
Peak HR: 102 {beats}/min
Rest HR: 81 {beats}/min
SRS: 4

## 2017-10-20 MED ORDER — REGADENOSON 0.4 MG/5ML IV SOLN
0.4000 mg | Freq: Once | INTRAVENOUS | Status: AC
  Administered 2017-10-20: 0.4 mg via INTRAVENOUS

## 2017-10-20 MED ORDER — TECHNETIUM TC 99M TETROFOSMIN IV KIT
10.4000 | PACK | Freq: Once | INTRAVENOUS | Status: AC | PRN
  Administered 2017-10-20: 10.4 via INTRAVENOUS
  Filled 2017-10-20: qty 11

## 2017-10-20 MED ORDER — TECHNETIUM TC 99M TETROFOSMIN IV KIT
31.5000 | PACK | Freq: Once | INTRAVENOUS | Status: AC | PRN
  Administered 2017-10-20: 31.5 via INTRAVENOUS
  Filled 2017-10-20: qty 32

## 2017-10-20 NOTE — Progress Notes (Signed)
Spoke with pt and notified of results per Dr. Wert. Pt verbalized understanding and denied any questions. 

## 2017-10-21 ENCOUNTER — Telehealth: Payer: Self-pay | Admitting: Internal Medicine

## 2017-10-21 ENCOUNTER — Other Ambulatory Visit: Payer: Self-pay | Admitting: Internal Medicine

## 2017-10-21 DIAGNOSIS — I5022 Chronic systolic (congestive) heart failure: Secondary | ICD-10-CM

## 2017-10-21 DIAGNOSIS — Z7901 Long term (current) use of anticoagulants: Secondary | ICD-10-CM

## 2017-10-21 DIAGNOSIS — I10 Essential (primary) hypertension: Secondary | ICD-10-CM

## 2017-10-21 MED ORDER — ISOSORBIDE MONONITRATE ER 30 MG PO TB24: ORAL_TABLET | ORAL | 1 refills | Status: DC

## 2017-10-21 NOTE — Telephone Encounter (Signed)
Spoke with pt, I advised her of the CT results. She had many questions, I advised her to make an appt to discuss. She agreed and will make an appt after she sees the cardiologist. Nothing further is needed at this time.

## 2017-10-21 NOTE — Telephone Encounter (Signed)
Pt is calling back [phone]

## 2017-10-21 NOTE — Progress Notes (Signed)
LMTCB

## 2017-10-21 NOTE — Telephone Encounter (Signed)
ATC pt, no answer. Left message for pt to call back.   Notes recorded by Tanda Rockers, MD on 10/21/2017 at 4:51 AM EDT Call patient : Study is non-diagnostic but point to more of a heart than lung issue but I see cards w/u already in progress so rec she complete that then return to see med to go over a plan to f/u the lung component /issues that are less impt at this point

## 2017-10-24 ENCOUNTER — Telehealth: Payer: Self-pay | Admitting: *Deleted

## 2017-10-24 NOTE — Telephone Encounter (Signed)
Pt has been notified of Myoview results. When I went over recommendations to start Imdur 30 mg daily, pt states PCP Dr. Melford Aase started her last week on Imdur 30 mg daily. Pt has f/u with Dr. Lovena Le 11/30/17. I will update pt's chart as to new start of Imdur. Pt thanked me for the call.

## 2017-10-24 NOTE — Telephone Encounter (Signed)
-----   Message from Will Meredith Leeds, MD sent at 10/21/2017 10:35 AM EDT ----- No signs of overt ischemia on study. start Imdur 30 mg. If chest pain continues, would plan for follow up with Lovena Le in clinic.

## 2017-10-26 ENCOUNTER — Ambulatory Visit (INDEPENDENT_AMBULATORY_CARE_PROVIDER_SITE_OTHER): Payer: Medicare Other | Admitting: Orthopaedic Surgery

## 2017-10-30 ENCOUNTER — Encounter (HOSPITAL_COMMUNITY): Payer: Self-pay | Admitting: Emergency Medicine

## 2017-10-30 ENCOUNTER — Inpatient Hospital Stay (HOSPITAL_COMMUNITY)
Admission: EM | Admit: 2017-10-30 | Discharge: 2017-11-02 | DRG: 291 | Disposition: A | Discharge status: Home / Self Care | Payer: Medicare Other | Attending: Internal Medicine | Admitting: Internal Medicine

## 2017-10-30 ENCOUNTER — Emergency Department (HOSPITAL_COMMUNITY): Payer: Medicare Other

## 2017-10-30 ENCOUNTER — Ambulatory Visit: Admission: RE | Admit: 2017-10-30 | Payer: Medicare Other | Source: Ambulatory Visit

## 2017-10-30 ENCOUNTER — Other Ambulatory Visit: Payer: Self-pay

## 2017-10-30 DIAGNOSIS — J703 Chronic drug-induced interstitial lung disorders: Secondary | ICD-10-CM | POA: Diagnosis present

## 2017-10-30 DIAGNOSIS — F419 Anxiety disorder, unspecified: Secondary | ICD-10-CM | POA: Diagnosis present

## 2017-10-30 DIAGNOSIS — J449 Chronic obstructive pulmonary disease, unspecified: Secondary | ICD-10-CM | POA: Diagnosis not present

## 2017-10-30 DIAGNOSIS — J9601 Acute respiratory failure with hypoxia: Secondary | ICD-10-CM | POA: Diagnosis present

## 2017-10-30 DIAGNOSIS — G629 Polyneuropathy, unspecified: Secondary | ICD-10-CM | POA: Diagnosis present

## 2017-10-30 DIAGNOSIS — Z95828 Presence of other vascular implants and grafts: Secondary | ICD-10-CM

## 2017-10-30 DIAGNOSIS — E669 Obesity, unspecified: Secondary | ICD-10-CM | POA: Diagnosis present

## 2017-10-30 DIAGNOSIS — I130 Hypertensive heart and chronic kidney disease with heart failure and stage 1 through stage 4 chronic kidney disease, or unspecified chronic kidney disease: Principal | ICD-10-CM | POA: Diagnosis present

## 2017-10-30 DIAGNOSIS — I5043 Acute on chronic combined systolic (congestive) and diastolic (congestive) heart failure: Secondary | ICD-10-CM | POA: Diagnosis not present

## 2017-10-30 DIAGNOSIS — T462X5S Adverse effect of other antidysrhythmic drugs, sequela: Secondary | ICD-10-CM | POA: Diagnosis not present

## 2017-10-30 DIAGNOSIS — K219 Gastro-esophageal reflux disease without esophagitis: Secondary | ICD-10-CM | POA: Diagnosis not present

## 2017-10-30 DIAGNOSIS — I5022 Chronic systolic (congestive) heart failure: Secondary | ICD-10-CM

## 2017-10-30 DIAGNOSIS — Z7901 Long term (current) use of anticoagulants: Secondary | ICD-10-CM

## 2017-10-30 DIAGNOSIS — I2510 Atherosclerotic heart disease of native coronary artery without angina pectoris: Secondary | ICD-10-CM | POA: Diagnosis not present

## 2017-10-30 DIAGNOSIS — R0789 Other chest pain: Secondary | ICD-10-CM | POA: Diagnosis not present

## 2017-10-30 DIAGNOSIS — E785 Hyperlipidemia, unspecified: Secondary | ICD-10-CM | POA: Diagnosis present

## 2017-10-30 DIAGNOSIS — I700 Atherosclerosis of aorta: Secondary | ICD-10-CM | POA: Diagnosis present

## 2017-10-30 DIAGNOSIS — M8580 Other specified disorders of bone density and structure, unspecified site: Secondary | ICD-10-CM | POA: Diagnosis not present

## 2017-10-30 DIAGNOSIS — Z79899 Other long term (current) drug therapy: Secondary | ICD-10-CM

## 2017-10-30 DIAGNOSIS — J8410 Pulmonary fibrosis, unspecified: Secondary | ICD-10-CM | POA: Diagnosis present

## 2017-10-30 DIAGNOSIS — I482 Chronic atrial fibrillation: Secondary | ICD-10-CM | POA: Diagnosis not present

## 2017-10-30 DIAGNOSIS — I872 Venous insufficiency (chronic) (peripheral): Secondary | ICD-10-CM | POA: Diagnosis present

## 2017-10-30 DIAGNOSIS — E039 Hypothyroidism, unspecified: Secondary | ICD-10-CM | POA: Diagnosis present

## 2017-10-30 DIAGNOSIS — Z87891 Personal history of nicotine dependence: Secondary | ICD-10-CM

## 2017-10-30 DIAGNOSIS — R600 Localized edema: Secondary | ICD-10-CM | POA: Diagnosis not present

## 2017-10-30 DIAGNOSIS — Z7982 Long term (current) use of aspirin: Secondary | ICD-10-CM

## 2017-10-30 DIAGNOSIS — Z881 Allergy status to other antibiotic agents status: Secondary | ICD-10-CM

## 2017-10-30 DIAGNOSIS — N183 Chronic kidney disease, stage 3 unspecified: Secondary | ICD-10-CM | POA: Diagnosis present

## 2017-10-30 DIAGNOSIS — R0602 Shortness of breath: Secondary | ICD-10-CM | POA: Diagnosis not present

## 2017-10-30 DIAGNOSIS — Z79890 Hormone replacement therapy (postmenopausal): Secondary | ICD-10-CM

## 2017-10-30 DIAGNOSIS — M109 Gout, unspecified: Secondary | ICD-10-CM | POA: Diagnosis present

## 2017-10-30 DIAGNOSIS — I2721 Secondary pulmonary arterial hypertension: Secondary | ICD-10-CM | POA: Diagnosis present

## 2017-10-30 DIAGNOSIS — Z8679 Personal history of other diseases of the circulatory system: Secondary | ICD-10-CM

## 2017-10-30 DIAGNOSIS — Z888 Allergy status to other drugs, medicaments and biological substances status: Secondary | ICD-10-CM

## 2017-10-30 DIAGNOSIS — I10 Essential (primary) hypertension: Secondary | ICD-10-CM | POA: Diagnosis not present

## 2017-10-30 DIAGNOSIS — I447 Left bundle-branch block, unspecified: Secondary | ICD-10-CM | POA: Diagnosis present

## 2017-10-30 DIAGNOSIS — I739 Peripheral vascular disease, unspecified: Secondary | ICD-10-CM | POA: Diagnosis not present

## 2017-10-30 DIAGNOSIS — R079 Chest pain, unspecified: Secondary | ICD-10-CM

## 2017-10-30 DIAGNOSIS — I4891 Unspecified atrial fibrillation: Secondary | ICD-10-CM | POA: Diagnosis not present

## 2017-10-30 DIAGNOSIS — Z6835 Body mass index (BMI) 35.0-35.9, adult: Secondary | ICD-10-CM

## 2017-10-30 DIAGNOSIS — Z885 Allergy status to narcotic agent status: Secondary | ICD-10-CM

## 2017-10-30 DIAGNOSIS — I340 Nonrheumatic mitral (valve) insufficiency: Secondary | ICD-10-CM | POA: Diagnosis not present

## 2017-10-30 HISTORY — DX: Peripheral vascular disease, unspecified: I73.9

## 2017-10-30 HISTORY — DX: Depression, unspecified: F32.A

## 2017-10-30 HISTORY — DX: Hypothyroidism, unspecified: E03.9

## 2017-10-30 HISTORY — DX: Abdominal aortic aneurysm, without rupture: I71.4

## 2017-10-30 HISTORY — DX: Heart failure, unspecified: I50.9

## 2017-10-30 HISTORY — DX: Hyperlipidemia, unspecified: E78.5

## 2017-10-30 HISTORY — DX: Major depressive disorder, single episode, unspecified: F32.9

## 2017-10-30 HISTORY — DX: Abdominal aortic aneurysm, without rupture, unspecified: I71.40

## 2017-10-30 LAB — BASIC METABOLIC PANEL WITH GFR
Anion gap: 16 — ABNORMAL HIGH (ref 5–15)
BUN: 12 mg/dL (ref 8–23)
CO2: 24 mmol/L (ref 22–32)
Calcium: 10.6 mg/dL — ABNORMAL HIGH (ref 8.9–10.3)
Chloride: 100 mmol/L (ref 98–111)
Creatinine, Ser: 1.44 mg/dL — ABNORMAL HIGH (ref 0.44–1.00)
GFR calc Af Amer: 40 mL/min — ABNORMAL LOW
GFR calc non Af Amer: 35 mL/min — ABNORMAL LOW
Glucose, Bld: 101 mg/dL — ABNORMAL HIGH (ref 70–99)
Potassium: 3.9 mmol/L (ref 3.5–5.1)
Sodium: 140 mmol/L (ref 135–145)

## 2017-10-30 LAB — PROTIME-INR
INR: 3.04
Prothrombin Time: 31.2 s — ABNORMAL HIGH (ref 11.4–15.2)

## 2017-10-30 LAB — CBC
HCT: 47.4 % — ABNORMAL HIGH (ref 36.0–46.0)
Hemoglobin: 15 g/dL (ref 12.0–15.0)
MCH: 29.4 pg (ref 26.0–34.0)
MCHC: 31.6 g/dL (ref 30.0–36.0)
MCV: 92.9 fL (ref 78.0–100.0)
Platelets: 243 K/uL (ref 150–400)
RBC: 5.1 MIL/uL (ref 3.87–5.11)
RDW: 15.5 % (ref 11.5–15.5)
WBC: 7.9 K/uL (ref 4.0–10.5)

## 2017-10-30 LAB — I-STAT TROPONIN, ED: Troponin i, poc: 0.02 ng/mL (ref 0.00–0.08)

## 2017-10-30 MED ORDER — ALLOPURINOL 300 MG PO TABS
300.0000 mg | ORAL_TABLET | Freq: Every day | ORAL | Status: DC
  Administered 2017-10-31 – 2017-11-02 (×3): 300 mg via ORAL
  Filled 2017-10-30 (×3): qty 1

## 2017-10-30 MED ORDER — IPRATROPIUM-ALBUTEROL 0.5-2.5 (3) MG/3ML IN SOLN: 3.0000 mL | RESPIRATORY_TRACT | Status: DC | PRN

## 2017-10-30 MED ORDER — ONDANSETRON HCL 4 MG/2ML IJ SOLN: 4.0000 mg | Freq: Four times a day (QID) | INTRAMUSCULAR | Status: DC | PRN

## 2017-10-30 MED ORDER — ONDANSETRON HCL 4 MG PO TABS: 4.0000 mg | ORAL_TABLET | Freq: Four times a day (QID) | ORAL | Status: DC | PRN

## 2017-10-30 MED ORDER — POTASSIUM CHLORIDE CRYS ER 20 MEQ PO TBCR
40.0000 meq | EXTENDED_RELEASE_TABLET | Freq: Every day | ORAL | Status: DC
  Administered 2017-10-31 – 2017-11-02 (×3): 40 meq via ORAL
  Filled 2017-10-30 (×3): qty 2

## 2017-10-30 MED ORDER — ALBUTEROL SULFATE (2.5 MG/3ML) 0.083% IN NEBU: 2.5000 mg | INHALATION_SOLUTION | RESPIRATORY_TRACT | Status: DC | PRN

## 2017-10-30 MED ORDER — MONTELUKAST SODIUM 10 MG PO TABS
10.0000 mg | ORAL_TABLET | Freq: Every day | ORAL | Status: DC
  Administered 2017-10-30 – 2017-11-01 (×3): 10 mg via ORAL
  Filled 2017-10-30 (×3): qty 1

## 2017-10-30 MED ORDER — PREDNISOLONE ACETATE 1 % OP SUSP
1.0000 [drp] | Freq: Two times a day (BID) | OPHTHALMIC | Status: DC
Start: 2017-10-30 — End: 2017-11-02
  Administered 2017-10-31 – 2017-11-02 (×6): 1 [drp] via OPHTHALMIC
  Filled 2017-10-30: qty 5

## 2017-10-30 MED ORDER — DIGOXIN 125 MCG PO TABS
0.1250 mg | ORAL_TABLET | Freq: Every day | ORAL | Status: DC
  Administered 2017-10-31 – 2017-11-02 (×3): 0.125 mg via ORAL
  Filled 2017-10-30 (×3): qty 1

## 2017-10-30 MED ORDER — ISOSORBIDE MONONITRATE ER 30 MG PO TB24
30.0000 mg | ORAL_TABLET | Freq: Every day | ORAL | Status: DC
  Administered 2017-10-31 – 2017-11-02 (×3): 30 mg via ORAL
  Filled 2017-10-30 (×3): qty 1

## 2017-10-30 MED ORDER — ASPIRIN EC 81 MG PO TBEC
81.0000 mg | DELAYED_RELEASE_TABLET | Freq: Every day | ORAL | Status: DC
  Administered 2017-10-31 – 2017-11-02 (×3): 81 mg via ORAL
  Filled 2017-10-30 (×3): qty 1

## 2017-10-30 MED ORDER — PRAVASTATIN SODIUM 40 MG PO TABS
40.0000 mg | ORAL_TABLET | Freq: Every day | ORAL | Status: DC
  Administered 2017-10-31 – 2017-11-02 (×3): 40 mg via ORAL
  Filled 2017-10-30 (×3): qty 1

## 2017-10-30 MED ORDER — TIOTROPIUM BROMIDE MONOHYDRATE 18 MCG IN CAPS
18.0000 ug | ORAL_CAPSULE | Freq: Every day | RESPIRATORY_TRACT | Status: DC
  Administered 2017-10-31 – 2017-11-02 (×3): 18 ug via RESPIRATORY_TRACT
  Filled 2017-10-30 (×2): qty 5

## 2017-10-30 MED ORDER — FUROSEMIDE 10 MG/ML IJ SOLN
40.0000 mg | Freq: Once | INTRAMUSCULAR | Status: AC
  Administered 2017-10-30: 40 mg via INTRAVENOUS
  Filled 2017-10-30: qty 4

## 2017-10-30 MED ORDER — PANTOPRAZOLE SODIUM 40 MG PO TBEC
40.0000 mg | DELAYED_RELEASE_TABLET | Freq: Every day | ORAL | Status: DC
  Administered 2017-10-31 – 2017-11-02 (×3): 40 mg via ORAL
  Filled 2017-10-30 (×3): qty 1

## 2017-10-30 MED ORDER — ACETAMINOPHEN 650 MG RE SUPP: 650.0000 mg | Freq: Four times a day (QID) | RECTAL | Status: DC | PRN

## 2017-10-30 MED ORDER — ACETAMINOPHEN 325 MG PO TABS
650.0000 mg | ORAL_TABLET | Freq: Four times a day (QID) | ORAL | Status: DC | PRN
  Administered 2017-11-01 (×3): 650 mg via ORAL
  Filled 2017-10-30 (×3): qty 2

## 2017-10-30 MED ORDER — GABAPENTIN 300 MG PO CAPS
300.0000 mg | ORAL_CAPSULE | Freq: Two times a day (BID) | ORAL | Status: DC
  Administered 2017-10-30 – 2017-11-02 (×6): 300 mg via ORAL
  Filled 2017-10-30 (×6): qty 1

## 2017-10-30 MED ORDER — LEVOTHYROXINE SODIUM 25 MCG PO TABS
25.0000 ug | ORAL_TABLET | Freq: Every day | ORAL | Status: DC
  Administered 2017-10-31 – 2017-11-02 (×3): 25 ug via ORAL
  Filled 2017-10-30 (×3): qty 1

## 2017-10-30 MED ORDER — DILTIAZEM HCL ER COATED BEADS 120 MG PO CP24: 120.0000 mg | ORAL_CAPSULE | Freq: Every day | ORAL | Status: DC

## 2017-10-30 MED ORDER — GUAIFENESIN ER 600 MG PO TB12: 600.0000 mg | ORAL_TABLET | Freq: Every day | ORAL | Status: DC | PRN

## 2017-10-30 NOTE — H&P (Signed)
History and Physical    Katie [name]:[id number] DOB: [birth year] DOA: 10/30/2017  PCP: [name], MD  Patient coming from: Home.  Chief Complaint: Shortness of breath.  HPI: Katie [name] is a 77 y.o. female with history of atrial fibrillation, COPD, interstitial lung disease, hypothyroidism has been having chronic shortness of breath which is acutely worsened with increasing peripheral edema.  Patient had recently followed up with her pulmonologist and cardiologist.  Has had stress test and HRCT done.  Last few days patient shortness of breath herself had to come to the ER.  During her last cardiology visit stress test done showed EF of 34% with no definite signs of ischemia.  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  Her cardiologist had decreased her dose of Lasix to 120 mg daily from 80 mg.  Despite taking which patient still has lower extremity edema and shortness of breath.  Denies any chest pain.  ED Course: In the ER chest x-ray shows chronic changes.  Cardiology was consulted.  At this time cardiology is planning to do possible right heart cath in the morning.  What dose of IV Lasix was given.  Review of Systems: As per HPI, rest all negative.   Past Medical History:  Diagnosis Date  . AAA (abdominal aortic aneurysm) ([place])   . Atrial fibrillation ([place])   . CHF (congestive heart failure) ([place])   . COPD (chronic obstructive pulmonary disease) ([place])   . Depression   . GERD (gastroesophageal reflux disease)   . Hyperlipidemia   . Hypertension   . Hypothyroid   . Migraine headache   . Osteopenia   . PVD (peripheral vascular disease) ([place])     Past Surgical History:  Procedure Laterality Date  . ABDOMINAL AORTIC ENDOVASCULAR STENT GRAFT N/A 04/11/2013   Procedure: ABDOMINAL AORTIC ENDOVASCULAR STENT GRAFT WITH RIGHT FEMORAL PATCH ANGIOPLASTY;  Surgeon: Mal Misty, MD;  Location: [facility];  Service: Vascular;  Laterality: N/A;  . BREAST  SURGERY     LEFT BREAST BIOPSY  . broken leg Left 1970s  . CARDIAC CATHETERIZATION    . CHOLECYSTECTOMY  1972  . COLONOSCOPY    . EYE SURGERY Bilateral   . Laser vein procedure    . REFRACTIVE SURGERY    . TONSILLECTOMY       reports that she quit smoking about 15 years ago. Her smoking use included cigarettes. She has never used smokeless tobacco. She reports that she drinks alcohol. She reports that she does not use drugs.  Allergies  Allergen Reactions  . Amiodarone Other (See Comments)    PULMONARY TOXICITY  . Diovan [Valsartan] Other (See Comments)    HYPOTENSION  . Doxycycline Diarrhea and Other (See Comments)    VISUAL DISTURBANCE  . Flexeril [Cyclobenzaprine] Other (See Comments)    FATIGUE  . Keflex [Cephalexin] Diarrhea  . Verapamil Other (See Comments)    EDEMA  . Codeine Hives    Family History  Problem Relation Age of Onset  . Cirrhosis Mother   . Cancer Mother 24       PANCREAS  . Heart defect Sister   . Breast cancer Sister        age 48  . Heart disease Sister   . Stroke Sister   . Alcohol abuse Father   . Depression Father   . Hypertension Brother   . Hyperlipidemia Son   . Heart disease Daughter     Prior to Admission medications  Medication Sig Start Date End Date Taking? Authorizing Provider  acetaminophen (TYLENOL) 500 MG tablet Take 1,000 mg by mouth every 6 (six) hours as needed for moderate pain.    Yes [provider]  Aclidinium Bromide (TUDORZA PRESSAIR) 400 MCG/ACT AEPB Inhale 1 puff into the lungs 2 (two) times daily.   Yes [provider]  albuterol (PROAIR HFA) 108 (90 Base) MCG/ACT inhaler 2 inhalations 10-15 minutes apart evert 4 hours as needed for Asthma Flare Patient taking differently: Inhale 2 puffs into the lungs every 4 (four) hours as needed for shortness of breath (asthma flare).  07/20/17  Yes [name], MD  allopurinol (ZYLOPRIM) 300 MG tablet Take 300 mg by mouth daily.   Yes [provider]  aspirin EC 81 MG tablet Take 81 mg by mouth daily.   Yes [provider]  Cholecalciferol (VITAMIN D3) 5000 units CAPS Take 5,000 Units by mouth daily.    Yes [provider]  digoxin (LANOXIN) 0.125 MG tablet Take 0.125 mg by mouth daily.   Yes [provider]  diltiazem (TIAZAC) 120 MG 24 hr capsule TAKE 1 CAPSULE BY MOUTH EVERY DAY Patient taking differently: Take 120 mg by mouth daily.  06/20/17  Yes [name], MD  gabapentin (NEURONTIN) 600 MG tablet TAKE 1/2 TO 1 TABLET BY MOUTH 3 TIMES DAILY. Patient taking differently: Take 300 mg by mouth 2 (two) times daily.  08/31/17  Yes Liane Comber, NP  guaiFENesin (MUCINEX PO) Take 1 tablet by mouth daily as needed (runny nose).   Yes [provider]  ipratropium (ATROVENT) 0.06 % nasal spray Use 1 to 2 sprays each Nostril 2 to 3 x / day as needed Patient taking differently: Place 1-2 sprays into both nostrils 3 (three) times daily as needed for rhinitis (congestion).  07/05/17 07/06/18 Yes [name], MD  ipratropium-albuterol (DUONEB) 0.5-2.5 (3) MG/3ML SOLN Take 3 mLs by nebulization every 4 (four) hours as needed (shortness of breath).   Yes [provider]  isosorbide mononitrate (IMDUR) 30 MG 24 hr tablet Take 1 tablet daily for BP & Heart Patient taking differently: Take 30 mg by mouth daily. for BP & Heart 10/21/17  Yes [name], MD  levothyroxine (SYNTHROID, LEVOTHROID) 50 MCG tablet Take 0.5 tablets (25 mcg total) by mouth daily before breakfast. 10/18/17  Yes Liane Comber, NP  montelukast (SINGULAIR) 10 MG tablet Take 10 mg by mouth at bedtime.   Yes [provider]  Olopatadine HCl 0.2 % SOLN Place 1 drop into both eyes daily.    Yes [provider]  pantoprazole (PROTONIX) 40 MG tablet Take 1 tablet (40 mg total) by mouth daily. 07/16/16  Yes Ghimire, Henreitta Leber, MD  Polyethyl Glycol-Propyl Glycol (SYSTANE OP) Place 1 drop into both eyes 4  (four) times daily as needed (dry eyes).    Yes [provider]  potassium chloride SA (KLOR-CON M20) 20 MEQ tablet TAKE 2 TABLETS (40 MEQ TOTAL) BY MOUTH DAILY. Patient taking differently: Take 40 mEq by mouth daily.  04/11/17  Yes Vicie Mutters, PA-C  pravastatin (PRAVACHOL) 40 MG tablet Take 40 mg by mouth daily.   Yes [provider]  prednisoLONE acetate (PRED FORTE) 1 % ophthalmic suspension Place 1 drop into both eyes 2 (two) times daily.  04/27/17  Yes [provider]  prochlorperazine (COMPAZINE) 5 MG tablet TAKE 1 TABLET BY MOUTH THREE TIMES A DAY AS NEEDED FOR VERTIGO OR NAUSEA Patient taking differently: Take 5 mg by  mouth daily as needed for nausea (vertigo).  10/09/17  Yes [name], MD  torsemide (DEMADEX) 20 MG tablet Take 2 tablets (40 mg total) by mouth 2 (two) times daily. Patient taking differently: Take 60 mg by mouth See admin instructions. Take 3 tablets (60 mg) by mouth twice daily - morning and 1pm 09/07/17  Yes Evans Lance, MD  triamcinolone cream (KENALOG) 0.1 % Apply 1 application topically daily as needed (foot itching).    Yes [provider]  warfarin (COUMADIN) 2 MG tablet Take 2-3 mg by mouth See admin instructions. Take 1 tablet (2 mg) by mouth on Thursday, Friday, Saturday morning, take 1 1/2 tablets (3 mg) on Sunday, Monday, Tuesday, Wednesday morning.   Yes [provider]  buPROPion (WELLBUTRIN XL) 150 MG 24 hr tablet TAKE 1 TABLET BY MOUTH EVERY DAY Patient not taking: Reported on 10/30/2017 10/05/17   [name], MD  ipratropium (ATROVENT) 0.02 % nebulizer solution Take 2.5 mLs (0.5 mg total) by nebulization every 4 (four) hours as needed for wheezing or shortness of breath. Patient not taking: Reported on 10/30/2017 06/21/17 06/21/18  Vicie Mutters, PA-C  levalbuterol [facility] HFA) 45 MCG/ACT inhaler Inhale 2 puffs into the lungs every 4 (four) hours as needed for wheezing. Patient not taking: Reported on  10/30/2017 06/23/17   Magdalen Spatz, NP  warfarin (COUMADIN) 3 MG tablet TAKE 1-2 TABLETS DAILY AS DIRECTED Patient not taking: Reported on 10/30/2017 05/12/17   Vicie Mutters, PA-C    Physical Exam: Vitals:   10/30/17 2130 10/30/17 2200 10/30/17 2246 10/30/17 2304  BP: (!) 147/75 129/79  (!) 151/81  Pulse: 83 88    Resp: 16 (!) 21    Temp:    98.1 F (36.7 C)  TempSrc:    Oral  SpO2: 96% 94%  93%  Weight:   90.9 kg   Height:   5\' 3"  (1.6 m)       Constitutional: Moderately built and nourished. Vitals:   10/30/17 2130 10/30/17 2200 10/30/17 2246 10/30/17 2304  BP: (!) 147/75 129/79  (!) 151/81  Pulse: 83 88    Resp: 16 (!) 21    Temp:    98.1 F (36.7 C)  TempSrc:    Oral  SpO2: 96% 94%  93%  Weight:   90.9 kg   Height:   5\' 3"  (1.6 m)    Eyes: Anicteric no pallor. ENMT: No discharge from the ears eyes nose or mouth. Neck: JVD elevated no mass felt. Respiratory: No rhonchi mild crepitations. Cardiovascular: S1-S2 heard no murmurs appreciated. Abdomen: Soft nontender bowel sounds present. Musculoskeletal: Bilateral lower extremity edema present. Skin: Chronic skin changes. Neurologic: Alert awake oriented to time place and person.  Moves all extremities. Psychiatric: Appears normal.  Normal affect.   Labs on Admission: I have personally reviewed following labs and imaging studies  CBC: Recent Labs  Lab 10/30/17 1549  WBC 7.9  HGB 15.0  HCT 47.4*  MCV 92.9  PLT 010   Basic Metabolic Panel: Recent Labs  Lab 10/30/17 1549  NA 140  K 3.9  CL 100  CO2 24  GLUCOSE 101*  BUN 12  CREATININE 1.44*  CALCIUM 10.6*   GFR: Estimated Creatinine Clearance: 36.1 mL/min (A) (by C-G formula based on SCr of 1.44 mg/dL (H)). Liver Function Tests: No results for input(s): AST, ALT, ALKPHOS, BILITOT, PROT, ALBUMIN in the last 168 hours. No results for input(s): LIPASE, AMYLASE in the last 168 hours. No results for input(s):  AMMONIA in the last 168  hours. Coagulation Profile: Recent Labs  Lab 10/30/17 1549  INR 3.04   Cardiac Enzymes: No results for input(s): CKTOTAL, CKMB, CKMBINDEX, TROPONINI in the last 168 hours. BNP (last 3 results) Recent Labs    06/23/17 1231  PROBNP 208.0*   HbA1C: No results for input(s): HGBA1C in the last 72 hours. CBG: No results for input(s): GLUCAP in the last 168 hours. Lipid Profile: No results for input(s): CHOL, HDL, LDLCALC, TRIG, CHOLHDL, LDLDIRECT in the last 72 hours. Thyroid Function Tests: No results for input(s): TSH, T4TOTAL, FREET4, T3FREE, THYROIDAB in the last 72 hours. Anemia Panel: No results for input(s): VITAMINB12, FOLATE, FERRITIN, TIBC, IRON, RETICCTPCT in the last 72 hours. Urine analysis:    Component Value Date/Time   COLORURINE YELLOW 09/21/2017 2155   APPEARANCEUR CLEAR 09/21/2017 2155   LABSPEC 1.006 09/21/2017 2155   PHURINE 6.0 09/21/2017 2155   GLUCOSEU NEGATIVE 09/21/2017 2155   HGBUR NEGATIVE 09/21/2017 2155   BILIRUBINUR NEGATIVE 09/21/2017 2155   KETONESUR NEGATIVE 09/21/2017 2155   PROTEINUR NEGATIVE 09/21/2017 2155   UROBILINOGEN 0.2 05/08/2014 1232   NITRITE NEGATIVE 09/21/2017 2155   LEUKOCYTESUR NEGATIVE 09/21/2017 2155   Sepsis Labs: @LABRCNTIP (procalcitonin:4,lacticidven:4) )No results found for this or any previous visit (from the past 240 hour(s)).   Radiological Exams on Admission: Dg Chest 2 View  Result Date: 10/30/2017 CLINICAL DATA:  Shortness of breath, intermittent chest pressure for 3 days. Nausea and vomiting. Sleep difficulties. History of COPD and CHF. EXAM: CHEST - 2 VIEW COMPARISON:  Chest CT October 20, 2017. FINDINGS: Stable cardiomegaly. Calcified aortic arch. Diffuse interstitial prominence without pleural effusion or focal consolidation. Mild biapical pleural thickening. Pulmonary vasculature appears normal. No pneumothorax. Soft tissue planes and included osseous structures are non suspicious. Partially imaged aorta Eck  stent graft. CT of material anterior abdominal wall. Osteopenia. IMPRESSION: Stable cardiomegaly. Chronic interstitial lung disease without focal consolidation. Aortic Atherosclerosis (ICD10-I70.0). Electronically Signed   By: Elon Alas M.D.   On: 10/30/2017 16:13    EKG: Independently reviewed.  A. fib with LBBB.  Assessment/Plan Principal Problem:   Acute respiratory failure with hypoxia (HCC) Active Problems:   Essential hypertension   Atrial fibrillation (HCC)   Hypothyroidism   CKD  stage III (GFR 51 ml/min)   Pulmonary Fibrosis sequellae of Amiodarone   PAH (pulmonary artery hypertension) (HCC)   Chronic systolic CHF (congestive heart failure) ([place])    1. Acute respiratory failure with hypoxia with exertional symptoms likely a combination of systolic CHF and interstitial lung disease.  Patient did receive a dose of Lasix IV in the ER probably will need removal..  Coumadin on hold at the request of cardiology for possible right heart cath in the body.  Closely follow intake output metabolic panel and daily weights. 2. A. fib rate controlled on digoxin and Cardizem.  Check digoxin levels.  Holding Coumadin for possible cardiac catheter procedure. 3. Hypothyroidism on Synthroid. 4. Gout on allopurinol. 5. CAD denies any chest pain.   DVT prophylaxis: Coumadin on hold.  INR is therapeutic. Code Status: Full code. Family Communication: Discussed with patient. Disposition Plan: Home. Consults called: Cardiology. Admission status: Observation.   Rise Patience MD [facility] Pager [phone].  If 7PM-7AM, please contact night-coverage [email or web address] Password [facility]  10/30/2017, 11:17 PM

## 2017-10-30 NOTE — Consult Note (Signed)
CARDIOLOGY CONSULT NOTE   Referring Physician: Dr. Hal Hope Primary Physician: Dr. Melford Aase Primary Cardiologist: Dr. Lovena Le Reason for Consultation: Shortness of breath  HPI: Katie [name] is a 77 y.o. female w/ history of CAD, CKD III, COPD, atrial fibrillation, HLD, PAD, chronic shortness of breath, and chronic chest pain who presents with acute on chronic shortness of breath and chest pain.   In brief, the patient has had a long-standing history of chronic shortness of breath and chest pressure for which she has been followed by cardiology and pulmonology.  She was most recently seen by Dr. Curt Bears and cardiology approximately 2 weeks ago.  At this time she reported continued symptoms of exertional dyspnea and occasional nonexertional chest pressure.  She was thought to be volume overloaded at the time and her Lasix was increased.  A nuclear stress test was ordered, which was interpreted as having no signs of overt ischemia.  This however did show an LVEF of 34%.  She was started on Imdur 30 mg daily with plans for short interval follow-up with her primary cardiologist Dr. Lovena Le.  Since this time, the patient's symptoms have progressed.  She expresses frustration with the fact that she does not know whether her symptoms are from lung disease or heart disease and she has had no relief despite multiple therapies.  The patient currently describes her symptoms of chest pain as a pressure in her center chest and epigastrium.  This is nonradiating.  It is occasionally present at rest and with exertion.  This does not seem to worsen with exertion.  She has not been able to identify any aggravating or alleviating factors.  Her shortness of breath is mild when at rest, but worsened substantially with exertion.  She additionally describes orthopnea, having had to cancel her recent MRI due to inability to lay flat in bed.  She has bilateral lower extremity edema which waxes and wanes.  Currently it is  stable at approximately its baseline.  In the ED, the patient's vital signs were normal.  She was noted to have mild oxygen desaturations with ambulation.  Her labs revealed stable CKD with a creatinine at baseline of around 1.4.  Her initial troponin was negative at 0.02.  Her CBC was unremarkable.  Her INR was 3.0.  Review of Systems:     Cardiac Review of Systems: {Y] = yes [ ]  = no  Chest Pain [ Y ]  Resting SOB [  Y ] Exertional SOB  [ Y ]  Orthopnea [ Y ]   Pedal Edema [ Y  ]    Palpitations [  ] Syncope  [  ]   Presyncope [   ]  General Review of Systems: [Y] = yes [  ]=no Constitional: recent weight change [  ]; anorexia [  ]; fatigue [ Y ]; nausea [  ]; night sweats [  ]; fever [  ]; or chills [  ];                                                                     Eyes : blurred vision [  ]; diplopia [   ]; vision changes [  ];  Amaurosis fugax[  ]; Resp: cough [  ];  wheezing[  ];  hemoptysis[  ];  PND [  ];  GI:  gallstones[  ], vomiting[  ];  dysphagia[  ]; melena[  ];  hematochezia [  ]; heartburn[  ];   GU: kidney stones [  ]; hematuria[  ];   dysuria [  ];  nocturia[  ]; incontinence [  ];             Skin: rash, swelling[  ];, hair loss[  ];  peripheral edema[  ];  or itching[  ]; Musculosketetal: myalgias[  ];  joint swelling[  ];  joint erythema[  ];  joint pain[  ];  back pain[  ];  Heme/Lymph: bruising[  ];  bleeding[  ];  anemia[  ];  Neuro: TIA[  ];  headaches[  ];  stroke[  ];  vertigo[  ];  seizures[  ];   paresthesias[  ];  difficulty walking[  ];  Psych:depression[  ]; anxiety[  ];  Endocrine: diabetes[  ];  thyroid dysfunction[  ];  Other:  Past Medical History:  Diagnosis Date  . AAA (abdominal aortic aneurysm) ([place])   . Atrial fibrillation ([place])   . CHF (congestive heart failure) ([place])   . COPD (chronic obstructive pulmonary disease) ([place])   . Depression   . GERD (gastroesophageal reflux disease)   . Hyperlipidemia   . Hypertension   . Hypothyroid   .  Migraine headache   . Osteopenia   . PVD (peripheral vascular disease) ([place])      (Not in a hospital admission)     Infusions:   Allergies  Allergen Reactions  . Amiodarone Other (See Comments)    PULMONARY TOXICITY  . Diovan [Valsartan] Other (See Comments)    HYPOTENSION  . Doxycycline Diarrhea and Other (See Comments)    VISUAL DISTURBANCE  . Flexeril [Cyclobenzaprine] Other (See Comments)    FATIGUE  . Keflex [Cephalexin] Diarrhea  . Verapamil Other (See Comments)    EDEMA  . Codeine Hives    Social History   Socioeconomic History  . Marital status: Married    Spouse name: Not on file  . Number of children: Not on file  . Years of education: Not on file  . Highest education level: Not on file  Occupational History  . Not on file  Social Needs  . Financial resource strain: Not on file  . Food insecurity:    Worry: Not on file    Inability: Not on file  . Transportation needs:    Medical: Not on file    Non-medical: Not on file  Tobacco Use  . Smoking status: Former Smoker    Types: Cigarettes    Last attempt to quit: 08/10/2002    Years since quitting: 15.2  . Smokeless tobacco: Never Used  . Tobacco comment: History of tobacco abuse  Substance and Sexual Activity  . Alcohol use: Yes    Alcohol/week: 0.0 standard drinks    Comment: rare  . Drug use: No  . Sexual activity: Yes    Birth control/protection: Post-menopausal  Lifestyle  . Physical activity:    Days per week: Not on file    Minutes per session: Not on file  . Stress: Not on file  Relationships  . Social connections:    Talks on phone: Not on file    Gets together: Not on file    Attends religious service: Not on file    Active member of club or organization: Not on file  Attends meetings of clubs or organizations: Not on file    Relationship status: Not on file  . Intimate partner violence:    Fear of current or ex partner: Not on file    Emotionally abused: Not on file     Physically abused: Not on file    Forced sexual activity: Not on file  Other Topics Concern  . Not on file  Social History Narrative   Lives in [place] with husband   One daughter    Family History  Problem Relation Age of Onset  . Cirrhosis Mother   . Cancer Mother 69       PANCREAS  . Heart defect Sister   . Breast cancer Sister        age 4  . Heart disease Sister   . Stroke Sister   . Alcohol abuse Father   . Depression Father   . Hypertension Brother   . Hyperlipidemia Son   . Heart disease Daughter     PHYSICAL EXAM: Vitals:   10/30/17 1930 10/30/17 1931  BP: (!) 144/94 (!) 144/94  Pulse: 88 81  Resp: 20 (!) 31  Temp:    SpO2: 94% 96%    No intake or output data in the 24 hours ending 10/30/17 2215  General: NAD.  No respiratory difficulty HEENT: normal Neck: supple.  JVP elevated to approximately 10 cm water.  Carotids 2+ bilat; no bruits. No lymphadenopathy or thryomegaly appreciated. Cor: PMI nondisplaced.  Irregularly irregular rhythm.  Normal rate..  2/6 holosystolic murmur heard throughout the precordium. Lungs: Faint, scattered crackles predominantly at the bases Abdomen: soft, nontender, nondistended. No hepatosplenomegaly. No bruits or masses. Good bowel sounds. Extremities: no cyanosis, clubbing, rash; 2+ pitting edema of the bilateral ankles extending upward to the knee, symmetric Neuro: alert & oriented x 3, cranial nerves grossly intact. moves all 4 extremities w/o difficulty. Affect pleasant.  ECG: Atrial fibrillation, heart rate 90 bpm, left axis deviation, left bundle branch block, repolarization abnormality consistent with LBBB  Results for orders placed or performed during the hospital encounter of 10/30/17 (from the past 24 hour(s))  Basic metabolic panel     Status: Abnormal   Collection Time: 10/30/17  3:49 PM  Result Value Ref Range   Sodium 140 135 - 145 mmol/L   Potassium 3.9 3.5 - 5.1 mmol/L   Chloride 100 98 - 111 mmol/L    CO2 24 22 - 32 mmol/L   Glucose, Bld 101 (H) 70 - 99 mg/dL   BUN 12 8 - 23 mg/dL   Creatinine, Ser 1.44 (H) 0.44 - 1.00 mg/dL   Calcium 10.6 (H) 8.9 - 10.3 mg/dL   GFR calc non Af Amer 35 (L) >60 mL/min   GFR calc Af Amer 40 (L) >60 mL/min   Anion gap 16 (H) 5 - 15  CBC     Status: Abnormal   Collection Time: 10/30/17  3:49 PM  Result Value Ref Range   WBC 7.9 4.0 - 10.5 K/uL   RBC 5.10 3.87 - 5.11 MIL/uL   Hemoglobin 15.0 12.0 - 15.0 g/dL   HCT 47.4 (H) 36.0 - 46.0 %   MCV 92.9 78.0 - 100.0 fL   MCH 29.4 26.0 - 34.0 pg   MCHC 31.6 30.0 - 36.0 g/dL   RDW 15.5 11.5 - 15.5 %   Platelets 243 150 - 400 K/uL  Protime-INR (order if Patient is taking Coumadin / Warfarin)     Status: Abnormal   Collection Time:  10/30/17  3:49 PM  Result Value Ref Range   Prothrombin Time 31.2 (H) 11.4 - 15.2 seconds   INR 3.04   I-stat troponin, ED     Status: None   Collection Time: 10/30/17  4:00 PM  Result Value Ref Range   Troponin i, poc 0.02 0.00 - 0.08 ng/mL   Comment 3           Dg Chest 2 View  Result Date: 10/30/2017 CLINICAL DATA:  Shortness of breath, intermittent chest pressure for 3 days. Nausea and vomiting. Sleep difficulties. History of COPD and CHF. EXAM: CHEST - 2 VIEW COMPARISON:  Chest CT October 20, 2017. FINDINGS: Stable cardiomegaly. Calcified aortic arch. Diffuse interstitial prominence without pleural effusion or focal consolidation. Mild biapical pleural thickening. Pulmonary vasculature appears normal. No pneumothorax. Soft tissue planes and included osseous structures are non suspicious. Partially imaged aorta Eck stent graft. CT of material anterior abdominal wall. Osteopenia. IMPRESSION: Stable cardiomegaly. Chronic interstitial lung disease without focal consolidation. Aortic Atherosclerosis (ICD10-I70.0). Electronically Signed   By: Elon Alas M.D.   On: 10/30/2017 16:13   ASSESSMENT: BREHANNA DEVENY is a 77 y.o. female w/ history of CAD, CKD III, COPD, atrial  fibrillation, HLD, PAD, chronic shortness of breath, and chronic chest pain who presents with acute on chronic shortness of breath and chest pain.  On exam, the patient does appear volume overloaded, which may explain her worsening symptoms of chest discomfort and dyspnea.  Her recent normal stress test is reassuring, however there are some difficulties with interpretation of this in the setting of her conduction system abnormality.    I had a long discussion with the patient regarding her treatment and what we could do for her if she were to be admitted to the hospital.  I presented to her the option of going home tonight and reaching out to Dr. Lovena Le tomorrow to discuss future options for treatment.  I also presented her with the option of coming in for observation, IV diuresis, and possible invasive work-up to help differentiate cardiac versus pulmonary symptoms.  After discussion with her daughter, the patient expressed a desire to be admitted to the hospital for more aggressive treatment and work-up.  In addition to diuresis with IV Lasix, I informed the patient that I would discuss with her inpatient team the possibility of undergoing right heart catheterization to better characterize her intracardiac filling pressures and perhaps provide a better understanding of whether her cardiac disease or pulmonary disease is the primary driver of her symptoms.  The patient said she would like to discuss this with Dr. Lovena Le during the daytime.  PLAN/DISCUSSION: - Recommend IV Lasix tonight for goal net -1 L - N.p.o. past midnight tonight - Check troponin at midnight tonight - Hold dose of warfarin tonight - Continue all other cardiac meds - We will discuss with daytime cardiology team as well as Dr. Lovena Le utility of right heart catheterization in the inpatient versus outpatient setting - Patient may additionally obtain diagnostic benefit of cardiopulmonary exercise testing at some point in the future when  she is euvolemic  Marcie Mowers, MD Cardiology Fellow, PGY-6

## 2017-10-30 NOTE — ED Notes (Signed)
Pt ambulated to the bathroom with steady gait, minimal assistance.

## 2017-10-30 NOTE — ED Triage Notes (Signed)
C/o sob and intermittent chest pressure over the past 2-3 days.  Denies nausea and vomiting.  Having to sleep sitting up.

## 2017-10-30 NOTE — ED Provider Notes (Signed)
[facility] EMERGENCY DEPARTMENT Provider Note   CSN: [id number] Arrival date & time: 10/30/17  1529     History   Chief Complaint Chief Complaint  Patient presents with  . Shortness of Breath  . Chest Pain    HPI Katie [name] is a 77 y.o. female.  Patient with history of chronic bronchitis, pulmonary hypertension, AAA, vascular disease presents with worsening shortness of breath and chest pressure over the past week.  Patient feels she has this at rest and also with exertion.  Patient denies blood clot history, recent surgery, unilateral leg swelling.  No known heart attack history, congestive heart failure in the past.  No fevers or chills.  No weight gain.  Nothing is improved her symptoms.     Past Medical History:  Diagnosis Date  . AAA (abdominal aortic aneurysm) ([place])   . Atrial fibrillation ([place])   . CHF (congestive heart failure) ([place])   . COPD (chronic obstructive pulmonary disease) ([place])   . Depression   . GERD (gastroesophageal reflux disease)   . Hyperlipidemia   . Hypertension   . Hypothyroid   . Migraine headache   . Osteopenia   . PVD (peripheral vascular disease) [facility])     Patient Active Problem List   Diagnosis Date Noted  . Acute respiratory failure with hypoxia ([place]) 10/30/2017  . Aortic atherosclerosis ([place]) 10/17/2017  . Chronic systolic CHF (congestive heart failure) ([place]) 09/21/2017  . Depression 09/21/2017  . Bradycardia 09/21/2017  . PAH (pulmonary artery hypertension) ([place]) 06/23/2017  . Esophageal dysphagia 05/15/2017  . Gout 03/20/2017  . Other abnormal glucose 09/19/2015  . AAA (abdominal aortic aneurysm) without rupture ([place]) 04/22/2015  . Morbid obesity due to excess calories ([place]) 08/03/2014  . PVD (peripheral vascular disease) with claudication ([place]) 10/16/2013  . CKD  stage III (GFR 51 ml/min) 06/08/2013  . Hyperlipidemia 06/08/2013  . Medication management 06/08/2013  . Pulmonary Fibrosis sequellae of  Amiodarone 06/08/2013  . Long term current use of anticoagulant therapy 04/23/2013  . Vitamin D deficiency 02/15/2013  . Hypothyroidism   . Osteopenia   . Congestive heart failure ([place]) 11/25/2008  . Migraine headache 11/22/2008  . Essential hypertension 11/22/2008  . Coronary atherosclerosis 11/22/2008  . Atrial fibrillation ([place]) 11/22/2008  . COPD GOLD 0  11/22/2008  . GERD 11/22/2008    Past Surgical History:  Procedure Laterality Date  . ABDOMINAL AORTIC ENDOVASCULAR STENT GRAFT N/A 04/11/2013   Procedure: ABDOMINAL AORTIC ENDOVASCULAR STENT GRAFT WITH RIGHT FEMORAL PATCH ANGIOPLASTY;  Surgeon: Mal Misty, MD;  Location: [facility];  Service: Vascular;  Laterality: N/A;  . BREAST SURGERY     LEFT BREAST BIOPSY  . broken leg Left 1970s  . CARDIAC CATHETERIZATION    . CHOLECYSTECTOMY  1972  . COLONOSCOPY    . EYE SURGERY Bilateral   . Laser vein procedure    . REFRACTIVE SURGERY    . TONSILLECTOMY       OB History    Gravida  4   Para  3   Term  3   Preterm      AB  1   Living  3     SAB      TAB      Ectopic      Multiple      Live Births               Home Medications    Prior to Admission medications   Medication Sig  Start Date End Date Taking? Authorizing Provider  acetaminophen (TYLENOL) 500 MG tablet Take 1,000 mg by mouth every 6 (six) hours as needed for moderate pain.    Yes [provider]  Aclidinium Bromide (TUDORZA PRESSAIR) 400 MCG/ACT AEPB Inhale 1 puff into the lungs 2 (two) times daily.   Yes [provider]  albuterol (PROAIR HFA) 108 (90 Base) MCG/ACT inhaler 2 inhalations 10-15 minutes apart evert 4 hours as needed for Asthma Flare Patient taking differently: Inhale 2 puffs into the lungs every 4 (four) hours as needed for shortness of breath (asthma flare).  07/20/17  Yes [name], MD  allopurinol (ZYLOPRIM) 300 MG tablet Take 300 mg by mouth daily.   Yes [provider]  aspirin EC 81 MG  tablet Take 81 mg by mouth daily.   Yes [provider]  Cholecalciferol (VITAMIN D3) 5000 units CAPS Take 5,000 Units by mouth daily.    Yes [provider]  digoxin (LANOXIN) 0.125 MG tablet Take 0.125 mg by mouth daily.   Yes [provider]  diltiazem (TIAZAC) 120 MG 24 hr capsule TAKE 1 CAPSULE BY MOUTH EVERY DAY Patient taking differently: Take 120 mg by mouth daily.  06/20/17  Yes [name], MD  gabapentin (NEURONTIN) 600 MG tablet TAKE 1/2 TO 1 TABLET BY MOUTH 3 TIMES DAILY. Patient taking differently: Take 300 mg by mouth 2 (two) times daily.  08/31/17  Yes Liane Comber, NP  guaiFENesin (MUCINEX PO) Take 1 tablet by mouth daily as needed (runny nose).   Yes [provider]  ipratropium (ATROVENT) 0.06 % nasal spray Use 1 to 2 sprays each Nostril 2 to 3 x / day as needed Patient taking differently: Place 1-2 sprays into both nostrils 3 (three) times daily as needed for rhinitis (congestion).  07/05/17 07/06/18 Yes [name], MD  ipratropium-albuterol (DUONEB) 0.5-2.5 (3) MG/3ML SOLN Take 3 mLs by nebulization every 4 (four) hours as needed (shortness of breath).   Yes [provider]  isosorbide mononitrate (IMDUR) 30 MG 24 hr tablet Take 1 tablet daily for BP & Heart Patient taking differently: Take 30 mg by mouth daily. for BP & Heart 10/21/17  Yes [name], MD  levothyroxine (SYNTHROID, LEVOTHROID) 50 MCG tablet Take 0.5 tablets (25 mcg total) by mouth daily before breakfast. 10/18/17  Yes Liane Comber, NP  montelukast (SINGULAIR) 10 MG tablet Take 10 mg by mouth at bedtime.   Yes [provider]  Olopatadine HCl 0.2 % SOLN Place 1 drop into both eyes daily.    Yes [provider]  pantoprazole (PROTONIX) 40 MG tablet Take 1 tablet (40 mg total) by mouth daily. 07/16/16  Yes Ghimire, Henreitta Leber, MD  Polyethyl Glycol-Propyl Glycol (SYSTANE OP) Place 1 drop into both eyes 4 (four) times daily as needed (dry  eyes).    Yes [provider]  potassium chloride SA (KLOR-CON M20) 20 MEQ tablet TAKE 2 TABLETS (40 MEQ TOTAL) BY MOUTH DAILY. Patient taking differently: Take 40 mEq by mouth daily.  04/11/17  Yes Vicie Mutters, PA-C  pravastatin (PRAVACHOL) 40 MG tablet Take 40 mg by mouth daily.   Yes [provider]  prednisoLONE acetate (PRED FORTE) 1 % ophthalmic suspension Place 1 drop into both eyes 2 (two) times daily.  04/27/17  Yes [provider]  prochlorperazine (COMPAZINE) 5 MG tablet TAKE 1 TABLET BY MOUTH THREE TIMES A DAY AS NEEDED FOR VERTIGO OR NAUSEA Patient taking differently: Take 5 mg by mouth daily  as needed for nausea (vertigo).  10/09/17  Yes [name], MD  torsemide (DEMADEX) 20 MG tablet Take 2 tablets (40 mg total) by mouth 2 (two) times daily. Patient taking differently: Take 60 mg by mouth See admin instructions. Take 3 tablets (60 mg) by mouth twice daily - morning and 1pm 09/07/17  Yes Evans Lance, MD  triamcinolone cream (KENALOG) 0.1 % Apply 1 application topically daily as needed (foot itching).    Yes [provider]  warfarin (COUMADIN) 2 MG tablet Take 2-3 mg by mouth See admin instructions. Take 1 tablet (2 mg) by mouth on Thursday, Friday, Saturday morning, take 1 1/2 tablets (3 mg) on Sunday, Monday, Tuesday, Wednesday morning.   Yes [provider]  buPROPion (WELLBUTRIN XL) 150 MG 24 hr tablet TAKE 1 TABLET BY MOUTH EVERY DAY Patient not taking: Reported on 10/30/2017 10/05/17   [name], MD  ipratropium (ATROVENT) 0.02 % nebulizer solution Take 2.5 mLs (0.5 mg total) by nebulization every 4 (four) hours as needed for wheezing or shortness of breath. Patient not taking: Reported on 10/30/2017 06/21/17 06/21/18  Vicie Mutters, PA-C  levalbuterol [facility] HFA) 45 MCG/ACT inhaler Inhale 2 puffs into the lungs every 4 (four) hours as needed for wheezing. Patient not taking: Reported on 10/30/2017 06/23/17   Magdalen Spatz, NP  warfarin (COUMADIN) 3 MG tablet TAKE 1-2 TABLETS DAILY AS DIRECTED Patient not taking: Reported on 10/30/2017 05/12/17   Vicie Mutters, PA-C    Family History Family History  Problem Relation Age of Onset  . Cirrhosis Mother   . Cancer Mother 43       PANCREAS  . Heart defect Sister   . Breast cancer Sister        age 62  . Heart disease Sister   . Stroke Sister   . Alcohol abuse Father   . Depression Father   . Hypertension Brother   . Hyperlipidemia Son   . Heart disease Daughter     Social History Social History   Tobacco Use  . Smoking status: Former Smoker    Types: Cigarettes    Last attempt to quit: 08/10/2002    Years since quitting: 15.2  . Smokeless tobacco: Never Used  . Tobacco comment: History of tobacco abuse  Substance Use Topics  . Alcohol use: Yes    Alcohol/week: 0.0 standard drinks    Comment: rare  . Drug use: No     Allergies   Amiodarone; Diovan [valsartan]; Doxycycline; Flexeril [cyclobenzaprine]; Keflex [cephalexin]; Verapamil; and Codeine   Review of Systems Review of Systems  Constitutional: Negative for chills and fever.  HENT: Negative for congestion.   Eyes: Negative for visual disturbance.  Respiratory: Positive for shortness of breath.   Cardiovascular: Positive for chest pain.  Gastrointestinal: Negative for abdominal pain and vomiting.  Genitourinary: Negative for dysuria and flank pain.  Musculoskeletal: Negative for back pain, neck pain and neck stiffness.  Skin: Negative for rash.  Neurological: Negative for light-headedness and headaches.     Physical Exam Updated Vital Signs BP (!) 151/81 (BP Location: Left Arm)   Pulse 88   Temp 98.1 F (36.7 C) (Oral)   Resp (!) 21   Ht 5\' 3"  (1.6 m)   Wt 90.9 kg   SpO2 93%   BMI 35.50 kg/m   Physical Exam  Constitutional: She is oriented to person, place, and time. She appears well-developed and well-nourished.  HENT:  Head: Normocephalic and atraumatic.  Eyes:  Conjunctivae  are normal. Right eye exhibits no discharge. Left eye exhibits no discharge.  Neck: Normal range of motion. Neck supple. No tracheal deviation present.  Cardiovascular: Normal rate and regular rhythm.  Pulmonary/Chest: Effort normal. She has rales in the right lower field and the left lower field.  Abdominal: Soft. She exhibits no distension. There is no tenderness. There is no guarding.  Musculoskeletal:       Right lower leg: She exhibits edema (minimal bilateral LE).       Left lower leg: She exhibits edema.  Neurological: She is alert and oriented to person, place, and time.  Skin: Skin is warm. No rash noted.  Psychiatric: She has a normal mood and affect.  Nursing note and vitals reviewed.    ED Treatments / Results  Labs (all labs ordered are listed, but only abnormal results are displayed) Labs Reviewed  BASIC METABOLIC PANEL - Abnormal; Notable for the following components:      Result Value   Glucose, Bld 101 (*)    Creatinine, Ser 1.44 (*)    Calcium 10.6 (*)    GFR calc non Af Amer 35 (*)    GFR calc Af Amer 40 (*)    Anion gap 16 (*)    All other components within normal limits  CBC - Abnormal; Notable for the following components:   HCT 47.4 (*)    All other components within normal limits  PROTIME-INR - Abnormal; Notable for the following components:   Prothrombin Time 31.2 (*)    All other components within normal limits  DIGOXIN LEVEL  BASIC METABOLIC PANEL  HEPATIC FUNCTION PANEL  CBC WITH DIFFERENTIAL/PLATELET  TROPONIN I  TROPONIN I  TROPONIN I  I-STAT TROPONIN, ED    EKG EKG Interpretation  Date/Time:  Sunday October 30 2017 15:34:07 EDT Ventricular Rate:  93 PR Interval:    QRS Duration: 142 QT Interval:  414 QTC Calculation: 514 R Axis:   -77 Text Interpretation:  Atrial fibrillation Left axis deviation Left bundle branch block Abnormal ECG Confirmed by Elnora Morrison [phone]) on 10/30/2017 7:05:19 PM   Radiology Dg Chest 2  View  Result Date: 10/30/2017 CLINICAL DATA:  Shortness of breath, intermittent chest pressure for 3 days. Nausea and vomiting. Sleep difficulties. History of COPD and CHF. EXAM: CHEST - 2 VIEW COMPARISON:  Chest CT October 20, 2017. FINDINGS: Stable cardiomegaly. Calcified aortic arch. Diffuse interstitial prominence without pleural effusion or focal consolidation. Mild biapical pleural thickening. Pulmonary vasculature appears normal. No pneumothorax. Soft tissue planes and included osseous structures are non suspicious. Partially imaged aorta Eck stent graft. CT of material anterior abdominal wall. Osteopenia. IMPRESSION: Stable cardiomegaly. Chronic interstitial lung disease without focal consolidation. Aortic Atherosclerosis (ICD10-I70.0). Electronically Signed   By: Elon Alas M.D.   On: 10/30/2017 16:13    Procedures Procedures (including critical care time)  Medications Ordered in ED Medications  allopurinol (ZYLOPRIM) tablet 300 mg (has no administration in time range)  aspirin EC tablet 81 mg (has no administration in time range)  digoxin (LANOXIN) tablet 0.125 mg (has no administration in time range)  diltiazem (CARDIZEM CD) 24 hr capsule 120 mg (has no administration in time range)  isosorbide mononitrate (IMDUR) 24 hr tablet 30 mg (has no administration in time range)  pravastatin (PRAVACHOL) tablet 40 mg (has no administration in time range)  levothyroxine (SYNTHROID, LEVOTHROID) tablet 25 mcg (has no administration in time range)  pantoprazole (PROTONIX) EC tablet 40 mg (has no administration in time range)  gabapentin (  NEURONTIN) capsule 300 mg (300 mg Oral Given 10/30/17 2332)  potassium chloride SA (K-DUR,KLOR-CON) CR tablet 40 mEq (has no administration in time range)  tiotropium (SPIRIVA) inhalation capsule 18 mcg (has no administration in time range)  albuterol (PROVENTIL) (2.5 MG/3ML) 0.083% nebulizer solution 2.5 mg (has no administration in time range)  guaiFENesin  (MUCINEX) 12 hr tablet 600 mg (has no administration in time range)  ipratropium-albuterol (DUONEB) 0.5-2.5 (3) MG/3ML nebulizer solution 3 mL (has no administration in time range)  montelukast (SINGULAIR) tablet 10 mg (10 mg Oral Given 10/30/17 2332)  prednisoLONE acetate (PRED FORTE) 1 % ophthalmic suspension 1 drop (has no administration in time range)  acetaminophen (TYLENOL) tablet 650 mg (has no administration in time range)    Or  acetaminophen (TYLENOL) suppository 650 mg (has no administration in time range)  ondansetron (ZOFRAN) tablet 4 mg (has no administration in time range)    Or  ondansetron (ZOFRAN) injection 4 mg (has no administration in time range)  furosemide (LASIX) injection 40 mg (40 mg Intravenous Given 10/30/17 2203)     Initial Impression / Assessment and Plan / ED Course  I have reviewed the triage vital signs and the nursing notes.  Pertinent labs & imaging results that were available during my care of the patient were reviewed by me and considered in my medical decision making (see chart for details).    Patient with history of atrial fibrillation on Coumadin and lung disease presents with worsening shortness of breath with exertion.  Patient did have abnormal stress test 10 days ago and is followed by cardiology and pulmonology.  Patient is frustrated as her symptoms are getting worse. Discussed this could be heart failure vs angina vs lung related.  Spoke with cardiology who recommended admission to hospitalist.  Troponin negative.  No active chest pain in the ED.  Patient had aspirin today.  The patients results and plan were reviewed and discussed.   Any x-rays performed were independently reviewed by myself.   Differential diagnosis were considered with the presenting HPI.  Medications  allopurinol (ZYLOPRIM) tablet 300 mg (has no administration in time range)  aspirin EC tablet 81 mg (has no administration in time range)  digoxin (LANOXIN) tablet  0.125 mg (has no administration in time range)  diltiazem (CARDIZEM CD) 24 hr capsule 120 mg (has no administration in time range)  isosorbide mononitrate (IMDUR) 24 hr tablet 30 mg (has no administration in time range)  pravastatin (PRAVACHOL) tablet 40 mg (has no administration in time range)  levothyroxine (SYNTHROID, LEVOTHROID) tablet 25 mcg (has no administration in time range)  pantoprazole (PROTONIX) EC tablet 40 mg (has no administration in time range)  gabapentin (NEURONTIN) capsule 300 mg (300 mg Oral Given 10/30/17 2332)  potassium chloride SA (K-DUR,KLOR-CON) CR tablet 40 mEq (has no administration in time range)  tiotropium (SPIRIVA) inhalation capsule 18 mcg (has no administration in time range)  albuterol (PROVENTIL) (2.5 MG/3ML) 0.083% nebulizer solution 2.5 mg (has no administration in time range)  guaiFENesin (MUCINEX) 12 hr tablet 600 mg (has no administration in time range)  ipratropium-albuterol (DUONEB) 0.5-2.5 (3) MG/3ML nebulizer solution 3 mL (has no administration in time range)  montelukast (SINGULAIR) tablet 10 mg (10 mg Oral Given 10/30/17 2332)  prednisoLONE acetate (PRED FORTE) 1 % ophthalmic suspension 1 drop (has no administration in time range)  acetaminophen (TYLENOL) tablet 650 mg (has no administration in time range)    Or  acetaminophen (TYLENOL) suppository 650 mg (has no administration in  time range)  ondansetron (ZOFRAN) tablet 4 mg (has no administration in time range)    Or  ondansetron (ZOFRAN) injection 4 mg (has no administration in time range)  furosemide (LASIX) injection 40 mg (40 mg Intravenous Given 10/30/17 2203)    Vitals:   10/30/17 2130 10/30/17 2200 10/30/17 2246 10/30/17 2304  BP: (!) 147/75 129/79  (!) 151/81  Pulse: 83 88    Resp: 16 (!) 21    Temp:    98.1 F (36.7 C)  TempSrc:    Oral  SpO2: 96% 94%  93%  Weight:   90.9 kg   Height:   5\' 3"  (1.6 m)     Final diagnoses:  Acute chest pain    Admission/ observation were  discussed with the admitting physician, patient and/or family and they are comfortable with the plan.    Final Clinical Impressions(s) / ED Diagnoses   Final diagnoses:  Acute chest pain    ED Discharge Orders    None       Elnora Morrison, MD 10/31/17 [phone]

## 2017-10-31 ENCOUNTER — Observation Stay (HOSPITAL_BASED_OUTPATIENT_CLINIC_OR_DEPARTMENT_OTHER): Payer: Medicare Other

## 2017-10-31 ENCOUNTER — Other Ambulatory Visit: Payer: Self-pay

## 2017-10-31 DIAGNOSIS — I340 Nonrheumatic mitral (valve) insufficiency: Secondary | ICD-10-CM

## 2017-10-31 DIAGNOSIS — E039 Hypothyroidism, unspecified: Secondary | ICD-10-CM

## 2017-10-31 DIAGNOSIS — J9601 Acute respiratory failure with hypoxia: Secondary | ICD-10-CM

## 2017-10-31 DIAGNOSIS — I4891 Unspecified atrial fibrillation: Secondary | ICD-10-CM

## 2017-10-31 DIAGNOSIS — I5043 Acute on chronic combined systolic (congestive) and diastolic (congestive) heart failure: Secondary | ICD-10-CM

## 2017-10-31 DIAGNOSIS — R0602 Shortness of breath: Secondary | ICD-10-CM

## 2017-10-31 DIAGNOSIS — R600 Localized edema: Secondary | ICD-10-CM

## 2017-10-31 LAB — HEPATIC FUNCTION PANEL
ALT: 15 U/L (ref 0–44)
AST: 17 U/L (ref 15–41)
Albumin: 3.6 g/dL (ref 3.5–5.0)
Alkaline Phosphatase: 36 U/L — ABNORMAL LOW (ref 38–126)
BILIRUBIN TOTAL: 1.1 mg/dL (ref 0.3–1.2)
Bilirubin, Direct: 0.2 mg/dL (ref 0.0–0.2)
Indirect Bilirubin: 0.9 mg/dL (ref 0.3–0.9)
Total Protein: 6.3 g/dL — ABNORMAL LOW (ref 6.5–8.1)

## 2017-10-31 LAB — BASIC METABOLIC PANEL
Anion gap: 14 (ref 5–15)
BUN: 15 mg/dL (ref 8–23)
CALCIUM: 9.8 mg/dL (ref 8.9–10.3)
CO2: 27 mmol/L (ref 22–32)
Chloride: 101 mmol/L (ref 98–111)
Creatinine, Ser: 1.5 mg/dL — ABNORMAL HIGH (ref 0.44–1.00)
GFR calc Af Amer: 38 mL/min — ABNORMAL LOW (ref >60)
GFR calc non Af Amer: 33 mL/min — ABNORMAL LOW (ref >60)
Glucose, Bld: 105 mg/dL — ABNORMAL HIGH (ref 70–99)
Potassium: 3.7 mmol/L (ref 3.5–5.1)
Sodium: 142 mmol/L (ref 135–145)

## 2017-10-31 LAB — ECHOCARDIOGRAM COMPLETE
Height: 63 in
Weight: 3224.01 oz

## 2017-10-31 LAB — CBC WITH DIFFERENTIAL/PLATELET
Abs Immature Granulocytes: 0 10*3/uL (ref 0.0–0.1)
BASOS PCT: 0 %
Basophils Absolute: 0 10*3/uL (ref 0.0–0.1)
Eosinophils Absolute: 0.1 10*3/uL (ref 0.0–0.7)
Eosinophils Relative: 1 %
HCT: 45.5 % (ref 36.0–46.0)
Hemoglobin: 14.4 g/dL (ref 12.0–15.0)
IMMATURE GRANULOCYTES: 0 %
Lymphocytes Relative: 17 %
Lymphs Abs: 1.3 10*3/uL (ref 0.7–4.0)
MCH: 29 pg (ref 26.0–34.0)
MCHC: 31.6 g/dL (ref 30.0–36.0)
MCV: 91.7 fL (ref 78.0–100.0)
MONOS PCT: 11 %
Monocytes Absolute: 0.9 10*3/uL (ref 0.1–1.0)
NEUTROS PCT: 71 %
Neutro Abs: 5.7 10*3/uL (ref 1.7–7.7)
PLATELETS: 234 10*3/uL (ref 150–400)
RBC: 4.96 MIL/uL (ref 3.87–5.11)
RDW: 15.4 % (ref 11.5–15.5)
WBC: 8.1 10*3/uL (ref 4.0–10.5)

## 2017-10-31 LAB — TROPONIN I
TROPONIN I: 0.04 ng/mL — AB (ref <0.03)
Troponin I: 0.04 ng/mL (ref <0.03)
Troponin I: 0.05 ng/mL (ref <0.03)

## 2017-10-31 LAB — DIGOXIN LEVEL: Digoxin Level: 0.8 ng/mL (ref 0.8–2.0)

## 2017-10-31 MED ORDER — METOPROLOL TARTRATE 12.5 MG HALF TABLET
12.5000 mg | ORAL_TABLET | Freq: Two times a day (BID) | ORAL | Status: DC
  Administered 2017-10-31 – 2017-11-02 (×5): 12.5 mg via ORAL
  Filled 2017-10-31 (×5): qty 1

## 2017-10-31 MED ORDER — FUROSEMIDE 10 MG/ML IJ SOLN
40.0000 mg | Freq: Once | INTRAMUSCULAR | Status: AC
  Administered 2017-10-31: 40 mg via INTRAVENOUS
  Filled 2017-10-31: qty 4

## 2017-10-31 MED ORDER — LORAZEPAM 0.5 MG PO TABS
0.2500 mg | ORAL_TABLET | Freq: Four times a day (QID) | ORAL | Status: DC | PRN
  Administered 2017-10-31: 0.25 mg via ORAL
  Filled 2017-10-31: qty 1

## 2017-10-31 MED ORDER — LORAZEPAM 0.5 MG PO TABS: 0.5000 mg | ORAL_TABLET | Freq: Four times a day (QID) | ORAL | Status: DC | PRN

## 2017-10-31 MED ORDER — TRAZODONE HCL 50 MG PO TABS
50.0000 mg | ORAL_TABLET | Freq: Once | ORAL | Status: AC
Start: 2017-10-31 — End: 2017-10-31
  Administered 2017-10-31: 50 mg via ORAL
  Filled 2017-10-31: qty 1

## 2017-10-31 NOTE — Progress Notes (Signed)
Progress Note    Katie [name]  ZOX:[id number] DOB: 02/19/1941  DOA: 10/30/2017 PCP: [name], MD    Brief Narrative:     Medical records reviewed and are as summarized below:  Katie [name] is an 77 y.o. female with history of atrial fibrillation, COPD, interstitial lung disease, hypothyroidism has been having chronic shortness of breath which is acutely worsened with increasing peripheral edema.  Patient had recently followed up with her pulmonologist and cardiologist.  Has had stress test and HRCT done.  Last few days patient shortness of breath herself had to come to the ER.  During her last cardiology visit stress test done showed EF of 34% with no definite signs of ischemia.  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  Her cardiologist had decreased her dose of Lasix to 120 mg daily from 80 mg.  Despite taking which patient still has lower extremity edema and shortness of breath.   Assessment/Plan:   Principal Problem:   Acute respiratory failure with hypoxia (HCC) Active Problems:   Essential hypertension   Atrial fibrillation (HCC)   Hypothyroidism   CKD  stage III (GFR 51 ml/min)   Pulmonary Fibrosis sequellae of Amiodarone   PAH (pulmonary artery hypertension) (HCC)   Chronic systolic CHF (congestive heart failure) (HCC)  Acute respiratory failure with hypoxia with exertional symptoms likely a combination of systolic CHF and interstitial lung disease.  -plan to do IV lasix to diurese- and monitor Cr daily- adjust daily -once euvolemic plan to do RHC possibly by Thursday -echo pending  A. fib rate controlled on digoxin  -Holding Coumadin for possible cardiac catheter procedure. -cardizem d/c and metoprolol started  Hypothyroidism  - Synthroid.  Gout  -allopurinol.  obesity Body mass index is 35.69 kg/m.  Anxiety -low dose ativan PRN  Family Communication/Anticipated D/C date and plan/Code Status   DVT prophylaxis:  coumadin Code Status: Full Code.  Family Communication: at bedside Disposition Plan:    Medical Consultants:    cards    Subjective:   Awaiting lunch C/o bed not being comfortable  Objective:    Vitals:   10/31/17 0500 10/31/17 0808 10/31/17 1010 10/31/17 1027  BP:  123/73  109/65  Pulse:  (!) 42 87   Resp:  20 16   Temp:  97.7 F (36.5 C)    TempSrc:  Oral    SpO2:  93% 93%   Weight: 91.4 kg     Height:        Intake/Output Summary (Last 24 hours) at 10/31/2017 1503 Last data filed at 10/31/2017 0116 Gross per 24 hour  Intake 240 ml  Output 550 ml  Net -310 ml   Filed Weights   10/30/17 1543 10/30/17 2246 10/31/17 0500  Weight: 88.9 kg 90.9 kg 91.4 kg    Exam: In chair, NAD +LE edema Venous insufficiency +BS, soft irr No wheezing, no increased work of breathing  Data Reviewed:   I have personally reviewed following labs and imaging studies:  Labs: Labs show the following:   Basic Metabolic Panel: Recent Labs  Lab 10/30/17 1549 10/31/17 0735  NA 140 142  K 3.9 3.7  CL 100 101  CO2 24 27  GLUCOSE 101* 105*  BUN 12 15  CREATININE 1.44* 1.50*  CALCIUM 10.6* 9.8   GFR Estimated Creatinine Clearance: 34.8 mL/min (A) (by C-G formula based on SCr of 1.5 mg/dL (H)). Liver Function Tests: Recent Labs  Lab 10/31/17 0735  AST 17  ALT 15  ALKPHOS 36*  BILITOT 1.1  PROT 6.3*  ALBUMIN 3.6   No results for input(s): LIPASE, AMYLASE in the last 168 hours. No results for input(s): AMMONIA in the last 168 hours. Coagulation profile Recent Labs  Lab 10/30/17 1549  INR 3.04    CBC: Recent Labs  Lab 10/30/17 1549 10/31/17 0735  WBC 7.9 8.1  NEUTROABS  --  5.7  HGB 15.0 14.4  HCT 47.4* 45.5  MCV 92.9 91.7  PLT 243 234   Cardiac Enzymes: Recent Labs  Lab 10/30/17 2343 10/31/17 0735 10/31/17 1117  TROPONINI 0.04* 0.05* 0.04*   BNP (last 3 results) Recent Labs    06/23/17 1231  PROBNP 208.0*   CBG: No results for  input(s): GLUCAP in the last 168 hours. D-Dimer: No results for input(s): DDIMER in the last 72 hours. Hgb A1c: No results for input(s): HGBA1C in the last 72 hours. Lipid Profile: No results for input(s): CHOL, HDL, LDLCALC, TRIG, CHOLHDL, LDLDIRECT in the last 72 hours. Thyroid function studies: No results for input(s): TSH, T4TOTAL, T3FREE, THYROIDAB in the last 72 hours.  Invalid input(s): FREET3 Anemia work up: No results for input(s): VITAMINB12, FOLATE, FERRITIN, TIBC, IRON, RETICCTPCT in the last 72 hours. Sepsis Labs: Recent Labs  Lab 10/30/17 1549 10/31/17 0735  WBC 7.9 8.1    Microbiology No results found for this or any previous visit (from the past 240 hour(s)).  Procedures and diagnostic studies:  Dg Chest 2 View  Result Date: 10/30/2017 CLINICAL DATA:  Shortness of breath, intermittent chest pressure for 3 days. Nausea and vomiting. Sleep difficulties. History of COPD and CHF. EXAM: CHEST - 2 VIEW COMPARISON:  Chest CT October 20, 2017. FINDINGS: Stable cardiomegaly. Calcified aortic arch. Diffuse interstitial prominence without pleural effusion or focal consolidation. Mild biapical pleural thickening. Pulmonary vasculature appears normal. No pneumothorax. Soft tissue planes and included osseous structures are non suspicious. Partially imaged aorta Eck stent graft. CT of material anterior abdominal wall. Osteopenia. IMPRESSION: Stable cardiomegaly. Chronic interstitial lung disease without focal consolidation. Aortic Atherosclerosis (ICD10-I70.0). Electronically Signed   By: Elon Alas M.D.   On: 10/30/2017 16:13    Medications:   . allopurinol  300 mg Oral Daily  . aspirin EC  81 mg Oral Daily  . digoxin  0.125 mg Oral Daily  . gabapentin  300 mg Oral BID  . isosorbide mononitrate  30 mg Oral Daily  . levothyroxine  25 mcg Oral QAC breakfast  . metoprolol tartrate  12.5 mg Oral BID  . montelukast  10 mg Oral QHS  . pantoprazole  40 mg Oral Daily  .  potassium chloride SA  40 mEq Oral Daily  . pravastatin  40 mg Oral Daily  . prednisoLONE acetate  1 drop Both Eyes BID  . tiotropium  18 mcg Inhalation Daily   Continuous Infusions:   LOS: 0 days   Geradine Girt  [facility]   *Please refer to [email or web address], password [email or web address] to get updated schedule on who will round on this patient, as hospitalists switch teams weekly. If 7PM-7AM, please contact night-coverage at [email or web address], password [email or web address] for any overnight needs.  10/31/2017, 3:03 PM

## 2017-10-31 NOTE — Progress Notes (Signed)
Progress Note  Patient Name: Katie [name] Date of Encounter: 10/31/2017  Primary Cardiologist: Cristopher Peru  Subjective   Patient seen this AM individually, and then I returned to see patient with her family present later this AM. She endorses worsening shortness of breath, LE edema, and orthopnea. Her chest discomfort is not exertional, and she recently had a stress test for the same symptoms that did not suggest ischemia. She is frustrated as she doesn't know what is wrong. See summary of discussion below.  Inpatient Medications    Scheduled Meds: . allopurinol  300 mg Oral Daily  . aspirin EC  81 mg Oral Daily  . digoxin  0.125 mg Oral Daily  . gabapentin  300 mg Oral BID  . isosorbide mononitrate  30 mg Oral Daily  . levothyroxine  25 mcg Oral QAC breakfast  . metoprolol tartrate  12.5 mg Oral BID  . montelukast  10 mg Oral QHS  . pantoprazole  40 mg Oral Daily  . potassium chloride SA  40 mEq Oral Daily  . pravastatin  40 mg Oral Daily  . prednisoLONE acetate  1 drop Both Eyes BID  . tiotropium  18 mcg Inhalation Daily   Continuous Infusions:  PRN Meds: acetaminophen **OR** acetaminophen, albuterol, guaiFENesin, ipratropium-albuterol, ondansetron **OR** ondansetron (ZOFRAN) IV   Vital Signs    Vitals:   10/31/17 0500 10/31/17 0808 10/31/17 1010 10/31/17 1027  BP:  123/73  109/65  Pulse:  (!) 42 87   Resp:  20 16   Temp:  97.7 F (36.5 C)    TempSrc:  Oral    SpO2:  93% 93%   Weight: 91.4 kg     Height:        Intake/Output Summary (Last 24 hours) at 10/31/2017 1102 Last data filed at 10/31/2017 0116 Gross per 24 hour  Intake 240 ml  Output 550 ml  Net -310 ml   Filed Weights   10/30/17 1543 10/30/17 2246 10/31/17 0500  Weight: 88.9 kg 90.9 kg 91.4 kg    Telemetry    Atrial fibrillation with PVCs - Personally Reviewed  ECG    Atrial fibrillation, LBBB - Personally Reviewed  Physical Exam   GEN: No acute distress.   Neck: supple, JVD just  above clavicle at 90 degrees Cardiac: irregularly irregular S1 and S2, 1/6 systolic murmur, no rubs or gallops.  Respiratory: faint crackles at bases GI: Soft, nontender, non-distended. Bowel sounds normal MS: 2+ bilateral LE edema, with skin discoloration c/w venous insufficiency Neuro:  Nonfocal, moves all limbs independently Psych: Normal affect though anxious  Labs    Chemistry Recent Labs  Lab 10/30/17 1549 10/31/17 0735  NA 140 142  K 3.9 3.7  CL 100 101  CO2 24 27  GLUCOSE 101* 105*  BUN 12 15  CREATININE 1.44* 1.50*  CALCIUM 10.6* 9.8  PROT  --  6.3*  ALBUMIN  --  3.6  AST  --  17  ALT  --  15  ALKPHOS  --  36*  BILITOT  --  1.1  GFRNONAA 35* 33*  GFRAA 40* 38*  ANIONGAP 16* 14     Hematology Recent Labs  Lab 10/30/17 1549 10/31/17 0735  WBC 7.9 8.1  RBC 5.10 4.96  HGB 15.0 14.4  HCT 47.4* 45.5  MCV 92.9 91.7  MCH 29.4 29.0  MCHC 31.6 31.6  RDW 15.5 15.4  PLT 243 234    Cardiac Enzymes Recent Labs  Lab 10/30/17 2343 10/31/17 0735  TROPONINI 0.04* 0.05*    Recent Labs  Lab 10/30/17 1600  TROPIPOC 0.02     BNPNo results for input(s): BNP, PROBNP in the last 168 hours.   DDimer No results for input(s): DDIMER in the last 168 hours.   Radiology    Dg Chest 2 View  Result Date: 10/30/2017 CLINICAL DATA:  Shortness of breath, intermittent chest pressure for 3 days. Nausea and vomiting. Sleep difficulties. History of COPD and CHF. EXAM: CHEST - 2 VIEW COMPARISON:  Chest CT October 20, 2017. FINDINGS: Stable cardiomegaly. Calcified aortic arch. Diffuse interstitial prominence without pleural effusion or focal consolidation. Mild biapical pleural thickening. Pulmonary vasculature appears normal. No pneumothorax. Soft tissue planes and included osseous structures are non suspicious. Partially imaged aorta Eck stent graft. CT of material anterior abdominal wall. Osteopenia. IMPRESSION: Stable cardiomegaly. Chronic interstitial lung disease without  focal consolidation. Aortic Atherosclerosis (ICD10-I70.0). Electronically Signed   By: Elon Alas M.D.   On: 10/30/2017 16:13    Cardiac Studies   Echo pending  Patient Profile     77 y.o. female history of CAD, CKD III, COPD, atrial fibrillation, HLD, PAD, chronic shortness of breath, and chronic chest pain who presents with acute on chronic shortness of breath and chest pain. She is seen as a consult at the request of Dr. Sherryl Barters. Eliseo Squires for management of her possible heart failure.  Assessment & Plan    Shortness of breath: LE edema, orthopnea, DOE, elevated JVD and basilar crackles support volume overload. She is on room air and speaks without difficulty, and she was able to ambulate in the hallway. Respiratory status is stable when she is upright, but she does endorse inability to lie flat.  I spoke at length with her this morning, and I spoke with her again at length with family at mid day. I understand her frustration regarding her breathing, and I suggested the following:  -we will repeat echo today, as the stress test EF was worse than her prior. Echo will also allow [place] to assess for valve disease and hopefully estimate filling pressures -she did make good urine with lasix, will give another dose this AM and monitor. Reports that neither torsemide nor furosemide was working for her at home -I stopped her diltiazem, given concern that her EF may be dropping and she is having LE edema. I will start low dose metoprolol given her low heart rate and titrate -she did not receive her coumadin in the AM (which is when she takes it), and INR was 3 yesterday. We will let this drift down in case of RHC later this week (see more below)  I stressed to her that ideally, a RHC when we think she is euvolemic is most helpful, as it will show [place] her filling pressures and cardiac output when we think she is tuned up from a cardiac standpoint. It is helpful because it may show [place] that her  filling pressures are still elevated, and therefore she has room for more diuresis, or it might show that her heart is optimized and her residual shortness of breath is from another source.  She initially wanted to go home today, but on speaking with her daughter, she is amenable to stay. We will diurese her and monitor her kidney function. I would encourage ambulation, leg elevation. She has peripheral neuropathy and states that she hasn't tolerated compression stockings in the past.  I would think she would be ready for RHC likely Thursday AM, and we will evaluate  her daily to see if she is ready. She has already requested medication for anxiety for the cath, which we will be able to supply, and she is asking for an anxiety medication for sleep. I discussed with her that if her breathing is short, we want to avoid medications like benzodiazepenes that may worsen that. I would try melatonin or a mild medication for sleep at bedtime and use a benzo only if absolutely necessary for sleep.   Time Spent Directly with Patient: I have spent a total of >90 minutes with the patient reviewing hospital notes, telemetry, EKGs, labs and examining the patient as well as establishing an assessment and plan that was discussed personally with the patient.  > 50% of time was spent in direct patient care.  Length of Stay:  LOS: 0 days   Buford Dresser, MD, PhD [facility]  [facility]   10/31/2017, 11:02 AM      For questions or updates, please contact [place] Please consult [email or web address] for contact info under Cardiology/STEMI.

## 2017-11-01 ENCOUNTER — Ambulatory Visit: Payer: Self-pay | Admitting: Adult Health

## 2017-11-01 DIAGNOSIS — I2510 Atherosclerotic heart disease of native coronary artery without angina pectoris: Secondary | ICD-10-CM | POA: Diagnosis present

## 2017-11-01 DIAGNOSIS — I482 Chronic atrial fibrillation: Secondary | ICD-10-CM | POA: Diagnosis not present

## 2017-11-01 DIAGNOSIS — R079 Chest pain, unspecified: Secondary | ICD-10-CM | POA: Diagnosis present

## 2017-11-01 DIAGNOSIS — I5043 Acute on chronic combined systolic (congestive) and diastolic (congestive) heart failure: Secondary | ICD-10-CM

## 2017-11-01 DIAGNOSIS — M8580 Other specified disorders of bone density and structure, unspecified site: Secondary | ICD-10-CM | POA: Diagnosis present

## 2017-11-01 DIAGNOSIS — J9601 Acute respiratory failure with hypoxia: Secondary | ICD-10-CM | POA: Diagnosis not present

## 2017-11-01 DIAGNOSIS — I10 Essential (primary) hypertension: Secondary | ICD-10-CM | POA: Diagnosis not present

## 2017-11-01 DIAGNOSIS — R0602 Shortness of breath: Secondary | ICD-10-CM | POA: Diagnosis not present

## 2017-11-01 DIAGNOSIS — I2721 Secondary pulmonary arterial hypertension: Secondary | ICD-10-CM | POA: Diagnosis present

## 2017-11-01 DIAGNOSIS — T462X5S Adverse effect of other antidysrhythmic drugs, sequela: Secondary | ICD-10-CM | POA: Diagnosis not present

## 2017-11-01 DIAGNOSIS — I447 Left bundle-branch block, unspecified: Secondary | ICD-10-CM | POA: Diagnosis present

## 2017-11-01 DIAGNOSIS — F419 Anxiety disorder, unspecified: Secondary | ICD-10-CM | POA: Diagnosis present

## 2017-11-01 DIAGNOSIS — J703 Chronic drug-induced interstitial lung disorders: Secondary | ICD-10-CM | POA: Diagnosis present

## 2017-11-01 DIAGNOSIS — I739 Peripheral vascular disease, unspecified: Secondary | ICD-10-CM | POA: Diagnosis present

## 2017-11-01 DIAGNOSIS — I700 Atherosclerosis of aorta: Secondary | ICD-10-CM | POA: Diagnosis present

## 2017-11-01 DIAGNOSIS — Z6835 Body mass index (BMI) 35.0-35.9, adult: Secondary | ICD-10-CM | POA: Diagnosis not present

## 2017-11-01 DIAGNOSIS — I4891 Unspecified atrial fibrillation: Secondary | ICD-10-CM | POA: Diagnosis not present

## 2017-11-01 DIAGNOSIS — E039 Hypothyroidism, unspecified: Secondary | ICD-10-CM | POA: Diagnosis not present

## 2017-11-01 DIAGNOSIS — I872 Venous insufficiency (chronic) (peripheral): Secondary | ICD-10-CM | POA: Diagnosis present

## 2017-11-01 DIAGNOSIS — M109 Gout, unspecified: Secondary | ICD-10-CM | POA: Diagnosis present

## 2017-11-01 DIAGNOSIS — J449 Chronic obstructive pulmonary disease, unspecified: Secondary | ICD-10-CM | POA: Diagnosis present

## 2017-11-01 DIAGNOSIS — G629 Polyneuropathy, unspecified: Secondary | ICD-10-CM | POA: Diagnosis present

## 2017-11-01 DIAGNOSIS — I130 Hypertensive heart and chronic kidney disease with heart failure and stage 1 through stage 4 chronic kidney disease, or unspecified chronic kidney disease: Secondary | ICD-10-CM | POA: Diagnosis present

## 2017-11-01 DIAGNOSIS — E785 Hyperlipidemia, unspecified: Secondary | ICD-10-CM | POA: Diagnosis present

## 2017-11-01 DIAGNOSIS — K219 Gastro-esophageal reflux disease without esophagitis: Secondary | ICD-10-CM | POA: Diagnosis present

## 2017-11-01 DIAGNOSIS — E669 Obesity, unspecified: Secondary | ICD-10-CM | POA: Diagnosis present

## 2017-11-01 DIAGNOSIS — N183 Chronic kidney disease, stage 3 (moderate): Secondary | ICD-10-CM | POA: Diagnosis not present

## 2017-11-01 DIAGNOSIS — Z95828 Presence of other vascular implants and grafts: Secondary | ICD-10-CM | POA: Diagnosis not present

## 2017-11-01 LAB — PROTIME-INR
INR: 3.57
PROTHROMBIN TIME: 35.4 s — AB (ref 11.4–15.2)

## 2017-11-01 LAB — CBC
HCT: 46.7 % — ABNORMAL HIGH (ref 36.0–46.0)
Hemoglobin: 14.7 g/dL (ref 12.0–15.0)
MCH: 29.3 pg (ref 26.0–34.0)
MCHC: 31.5 g/dL (ref 30.0–36.0)
MCV: 93 fL (ref 78.0–100.0)
PLATELETS: 219 10*3/uL (ref 150–400)
RBC: 5.02 MIL/uL (ref 3.87–5.11)
RDW: 15.4 % (ref 11.5–15.5)
WBC: 8.4 10*3/uL (ref 4.0–10.5)

## 2017-11-01 LAB — BASIC METABOLIC PANEL
ANION GAP: 10 (ref 5–15)
BUN: 19 mg/dL (ref 8–23)
CO2: 27 mmol/L (ref 22–32)
Calcium: 9.5 mg/dL (ref 8.9–10.3)
Chloride: 101 mmol/L (ref 98–111)
Creatinine, Ser: 1.81 mg/dL — ABNORMAL HIGH (ref 0.44–1.00)
GFR, EST AFRICAN AMERICAN: 30 mL/min — AB (ref >60)
GFR, EST NON AFRICAN AMERICAN: 26 mL/min — AB (ref >60)
Glucose, Bld: 109 mg/dL — ABNORMAL HIGH (ref 70–99)
POTASSIUM: 4 mmol/L (ref 3.5–5.1)
SODIUM: 138 mmol/L (ref 135–145)

## 2017-11-01 MED ORDER — HYDROXYZINE HCL 10 MG PO TABS
10.0000 mg | ORAL_TABLET | Freq: Three times a day (TID) | ORAL | Status: DC | PRN
  Filled 2017-11-01: qty 1

## 2017-11-01 MED ORDER — MELATONIN 3 MG PO TABS
3.0000 mg | ORAL_TABLET | Freq: Every evening | ORAL | Status: DC | PRN
  Administered 2017-11-01: 3 mg via ORAL
  Filled 2017-11-01 (×2): qty 1

## 2017-11-01 MED ORDER — FUROSEMIDE 10 MG/ML IJ SOLN
80.0000 mg | Freq: Two times a day (BID) | INTRAMUSCULAR | Status: AC
  Administered 2017-11-01 (×2): 80 mg via INTRAVENOUS
  Filled 2017-11-01 (×2): qty 8

## 2017-11-01 MED ORDER — TORSEMIDE 20 MG PO TABS
80.0000 mg | ORAL_TABLET | Freq: Every day | ORAL | Status: DC
  Administered 2017-11-02: 80 mg via ORAL
  Filled 2017-11-01: qty 4

## 2017-11-01 MED ORDER — TRAZODONE HCL 150 MG PO TABS
75.0000 mg | ORAL_TABLET | Freq: Once | ORAL | Status: AC
  Administered 2017-11-01: 75 mg via ORAL
  Filled 2017-11-01: qty 1

## 2017-11-01 MED ORDER — NON FORMULARY: 3.0000 mg | Freq: Every evening | Status: DC | PRN

## 2017-11-01 NOTE — Progress Notes (Signed)
Progress Note  Patient Name: Katie [name] Date of Encounter: 11/01/2017  Primary Cardiologist: Cristopher Peru  Subjective   Patient reports improved breathing and LE edema. Reviewed results of echo with her. Many questions, options reviewed below. No chest pain.  Inpatient Medications    Scheduled Meds: . allopurinol  300 mg Oral Daily  . aspirin EC  81 mg Oral Daily  . digoxin  0.125 mg Oral Daily  . furosemide  80 mg Intravenous BID  . gabapentin  300 mg Oral BID  . isosorbide mononitrate  30 mg Oral Daily  . levothyroxine  25 mcg Oral QAC breakfast  . metoprolol tartrate  12.5 mg Oral BID  . montelukast  10 mg Oral QHS  . pantoprazole  40 mg Oral Daily  . potassium chloride SA  40 mEq Oral Daily  . pravastatin  40 mg Oral Daily  . prednisoLONE acetate  1 drop Both Eyes BID  . tiotropium  18 mcg Inhalation Daily   Continuous Infusions:  PRN Meds: acetaminophen **OR** acetaminophen, albuterol, guaiFENesin, hydrOXYzine, ipratropium-albuterol, ondansetron **OR** ondansetron (ZOFRAN) IV   Vital Signs    Vitals:   10/31/17 2119 11/01/17 0500 11/01/17 0750 11/01/17 0924  BP: (!) 145/72  127/85   Pulse: 76  81 78  Resp: 20  14 16   Temp: (!) 97.5 F (36.4 C)  97.8 F (36.6 C)   TempSrc: Oral  Oral   SpO2: 95%  96% 93%  Weight:  92.1 kg    Height:        Intake/Output Summary (Last 24 hours) at 11/01/2017 1628 Last data filed at 11/01/2017 1423 Gross per 24 hour  Intake 840 ml  Output 250 ml  Net 590 ml   Filed Weights   10/30/17 2246 10/31/17 0500 11/01/17 0500  Weight: 90.9 kg 91.4 kg 92.1 kg    Telemetry    Atrial fibrillation with PVCs - Personally Reviewed  ECG    Atrial fibrillation, LBBB - Personally Reviewed  Physical Exam   GEN: No acute distress.   Neck: supple, JVD not visible at 90 degrees Cardiac: irregularly irregular S1 and S2, 1/6 systolic murmur, no rubs or gallops.  Respiratory: faint crackles at bases, improved GI: Soft,  nontender, non-distended. Bowel sounds normal MS: 1+ bilateral LE edema, with skin discoloration c/w venous insufficiency, improved from prior Neuro:  Nonfocal, moves all limbs independently Psych: Normal affect though anxious  Labs    Chemistry Recent Labs  Lab 10/30/17 1549 10/31/17 0735 11/01/17 0309  NA 140 142 138  K 3.9 3.7 4.0  CL 100 101 101  CO2 24 27 27   GLUCOSE 101* 105* 109*  BUN 12 15 19   CREATININE 1.44* 1.50* 1.81*  CALCIUM 10.6* 9.8 9.5  PROT  --  6.3*  --   ALBUMIN  --  3.6  --   AST  --  17  --   ALT  --  15  --   ALKPHOS  --  36*  --   BILITOT  --  1.1  --   GFRNONAA 35* 33* 26*  GFRAA 40* 38* 30*  ANIONGAP 16* 14 10     Hematology Recent Labs  Lab 10/30/17 1549 10/31/17 0735 11/01/17 0309  WBC 7.9 8.1 8.4  RBC 5.10 4.96 5.02  HGB 15.0 14.4 14.7  HCT 47.4* 45.5 46.7*  MCV 92.9 91.7 93.0  MCH 29.4 29.0 29.3  MCHC 31.6 31.6 31.5  RDW 15.5 15.4 15.4  PLT 243 234 219  Cardiac Enzymes Recent Labs  Lab 10/30/17 2343 10/31/17 0735 10/31/17 1117  TROPONINI 0.04* 0.05* 0.04*    Recent Labs  Lab 10/30/17 1600  TROPIPOC 0.02     BNPNo results for input(s): BNP, PROBNP in the last 168 hours.   DDimer No results for input(s): DDIMER in the last 168 hours.   Radiology    No results found.  Cardiac Studies   Echo 11/01/17 Left ventricle: The cavity size was mildly dilated. There was   moderate focal basal and mild concentric hypertrophy. Systolic   function was moderately reduced. The estimated ejection fraction   was in the range of 35% to 40%. Severe diffuse hypokinesis with   regional variations. The study was not technically sufficient to   allow evaluation of LV diastolic dysfunction due to atrial   fibrillation. - Ventricular septum: Septal motion showed abnormal function. These   changes are consistent with intraventricular conduction delay. - Aortic valve: Moderately calcified annulus. Trileaflet; mildly   thickened,  mildly calcified leaflets. There was mild stenosis.   There was mild regurgitation. Mean gradient (S): 9 mm Hg. Valve   area (VTI): 1.19 cm^2. Valve area (Vmax): 1.36 cm^2. Valve area   (Vmean): 1.25 cm^2. - Aorta: Aortic root dimension: 39 mm (ED). - Aortic root: The aortic root was mildly dilated. - Mitral valve: Calcified annulus. There was mild regurgitation. - Left atrium: The atrium was mildly dilated. - Right atrium: The atrium was moderately to severely dilated. - Tricuspid valve: There was mild regurgitation. - Pulmonic valve: There was trivial regurgitation. - Pulmonary arteries: PA peak pressure: 37 mm Hg (S). - Impressions: The findings indicate significant septal-lateral   left ventricular wall dyssynchrony.  Impressions:  - Compared to prior echo LV function appears worse but may be due   to significant septal lateral dyssynchrony from LBBB. The   findings indicate significant septal-lateral left ventricular   wall dyssynchrony. The right ventricular systolic pressure was   increased consistent with mild pulmonary hypertension.  Patient Profile     77 y.o. female history of CAD, CKD III, COPD, atrial fibrillation, HLD, PAD, chronic shortness of breath, and chronic chest pain who presents with acute on chronic shortness of breath and chest pain. She is seen as a consult at the request of Dr. Sherryl Barters. Eliseo Squires for management of her possible heart failure.  Assessment & Plan    Shortness of breath: LE edema, orthopnea, DOE, elevated JVD and basilar crackles support volume overload. She is on room air and speaks without difficulty, and she was able to ambulate in the hallway. Respiratory status is stable when she is upright, but she does endorse inability to lie flat.  We reviewed the results of her echo at length. Her EF is down to 35-40% with dyssynchrony, and her PA pressures are mildly elevated. Given the improvement in her symptoms, she is anxious to go home. I  discussed several options with her, including the prior plan for RHC, medical management, and discussion with EP re: CRT therapy. She currently would like to try to optimize her medications and go home ASAP, with outpatient follow up with Dr. Lovena Le. I did discuss this with Dr. Lovena Le, and he is amenable to this plan.  If her symptoms do not continue to improve, there is still the possibility of [place] on Thursday, but patient is leaning against this now.  Plans for her acute on chronic systolic and diastolic heart failure, atrial fibrillation, and LBBB: -change to PO torsemide, will increase  to 80 mg oral dose as this is the equivalent of what she has been receiving -optimize heart failure medications, already on metoprolol (will consolidate to succinate). Cr has been 1.11-1.8, it is currently rising. Has been as high as >2 in the past. Will ideally try ACEI (valsartan in allergy list), may need to wait until outpatient to make sure renal function is stable -continue imdur -continue aspirin, pravastatin for now -continue digoxin -melatonin to sleep, nonformulary request -if tomorrow there is no final plan for RHC, will restart coumadin   Time Spent Directly with Patient: I have spent a total of 45 minutes with the patient reviewing hospital notes, telemetry, EKGs, labs and examining the patient as well as establishing an assessment and plan that was discussed personally with the patient.  > 50% of time was spent in direct patient care.  Length of Stay:  LOS: 0 days   Buford Dresser, MD, PhD [facility]   11/01/2017, 4:28 PM      For questions or updates, please contact [place] Please consult [email or web address] for contact info under Cardiology/STEMI.

## 2017-11-01 NOTE — Progress Notes (Addendum)
Progress Note    Katie [name]  BSW:[id number] DOB: Jun 13, 1940  DOA: 10/30/2017 PCP: [name], MD    Brief Narrative:     Medical records reviewed and are as summarized below:  Katie [name] is an 77 y.o. female with history of atrial fibrillation, COPD, interstitial lung disease, hypothyroidism has been having chronic shortness of breath which is acutely worsened with increasing peripheral edema.  Patient had recently followed up with her pulmonologist and cardiologist.  Has had stress test and HRCT done.  Last few days patient shortness of breath herself had to come to the ER.  During her last cardiology visit stress test done showed EF of 34% with no definite signs of ischemia.  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  Her cardiologist had decreased her dose of Lasix to 120 mg daily from 80 mg.  Despite taking which patient still has lower extremity edema and shortness of breath.   Assessment/Plan:   Principal Problem:   Acute respiratory failure with hypoxia (HCC) Active Problems:   Essential hypertension   Atrial fibrillation (HCC)   Hypothyroidism   CKD  stage III (GFR 51 ml/min)   Pulmonary Fibrosis sequellae of Amiodarone   PAH (pulmonary artery hypertension) (HCC)   Chronic systolic CHF (congestive heart failure) (HCC)  Acute respiratory failure with hypoxia with exertional symptoms likely a combination of systolic CHF and interstitial lung disease.  -cardiology plans to do IV lasix to diurese- and monitor Cr daily- adjust daily -once euvolemic plan to do RHC possibly by Thursday -echo: Compared to prior echo LV function appears worse but may be due   to significant septal lateral dyssynchrony from LBBB. The   findings indicate significant septal-lateral left ventricular   wall dyssynchrony. The right ventricular systolic pressure was   increased consistent with mild pulmonary hypertension. -able to walk around the unit w/o same  shortness of breath after IV lasix  A. fib rate controlled on digoxin  -Holding Coumadin for possible cardiac catheter procedure. -cardizem d/c'd and metoprolol started  Hypothyroidism  - Synthroid.  Gout  -allopurinol.  obesity Body mass index is 35.96 kg/m.  Anxiety -hydroxizine  Family Communication/Anticipated D/C date and plan/Code Status   DVT prophylaxis: coumadin Code Status: Full Code.  Family Communication: at bedside 8/19 Disposition Plan:    Medical Consultants:    cards    Subjective:   Has been walking around unit today  Objective:    Vitals:   10/31/17 2119 11/01/17 0500 11/01/17 0750 11/01/17 0924  BP: (!) 145/72  127/85   Pulse: 76  81 78  Resp: 20  14 16   Temp: (!) 97.5 F (36.4 C)  97.8 F (36.6 C)   TempSrc: Oral  Oral   SpO2: 95%  96% 93%  Weight:  92.1 kg    Height:        Intake/Output Summary (Last 24 hours) at 11/01/2017 1426 Last data filed at 11/01/2017 1423 Gross per 24 hour  Intake 840 ml  Output 250 ml  Net 590 ml   Filed Weights   10/30/17 2246 10/31/17 0500 11/01/17 0500  Weight: 90.9 kg 91.4 kg 92.1 kg    Exam: In chair, legs down lessened LE edema No wheezing, no increased work of breathing irr No rashes Pleasant and cooperative  Data Reviewed:   I have personally reviewed following labs and imaging studies:  Labs: Labs show the following:   Basic Metabolic Panel: Recent Labs  Lab 10/30/17 1549  10/31/17 0735 11/01/17 0309  NA 140 142 138  K 3.9 3.7 4.0  CL 100 101 101  CO2 24 27 27   GLUCOSE 101* 105* 109*  BUN 12 15 19   CREATININE 1.44* 1.50* 1.81*  CALCIUM 10.6* 9.8 9.5   GFR Estimated Creatinine Clearance: 29 mL/min (A) (by C-G formula based on SCr of 1.81 mg/dL (H)). Liver Function Tests: Recent Labs  Lab 10/31/17 0735  AST 17  ALT 15  ALKPHOS 36*  BILITOT 1.1  PROT 6.3*  ALBUMIN 3.6   No results for input(s): LIPASE, AMYLASE in the last 168 hours. No results for input(s):  AMMONIA in the last 168 hours. Coagulation profile Recent Labs  Lab 10/30/17 1549 11/01/17 0309  INR 3.04 3.57    CBC: Recent Labs  Lab 10/30/17 1549 10/31/17 0735 11/01/17 0309  WBC 7.9 8.1 8.4  NEUTROABS  --  5.7  --   HGB 15.0 14.4 14.7  HCT 47.4* 45.5 46.7*  MCV 92.9 91.7 93.0  PLT 243 234 219   Cardiac Enzymes: Recent Labs  Lab 10/30/17 2343 10/31/17 0735 10/31/17 1117  TROPONINI 0.04* 0.05* 0.04*   BNP (last 3 results) Recent Labs    06/23/17 1231  PROBNP 208.0*   CBG: No results for input(s): GLUCAP in the last 168 hours. D-Dimer: No results for input(s): DDIMER in the last 72 hours. Hgb A1c: No results for input(s): HGBA1C in the last 72 hours. Lipid Profile: No results for input(s): CHOL, HDL, LDLCALC, TRIG, CHOLHDL, LDLDIRECT in the last 72 hours. Thyroid function studies: No results for input(s): TSH, T4TOTAL, T3FREE, THYROIDAB in the last 72 hours.  Invalid input(s): FREET3 Anemia work up: No results for input(s): VITAMINB12, FOLATE, FERRITIN, TIBC, IRON, RETICCTPCT in the last 72 hours. Sepsis Labs: Recent Labs  Lab 10/30/17 1549 10/31/17 0735 11/01/17 0309  WBC 7.9 8.1 8.4    Microbiology No results found for this or any previous visit (from the past 240 hour(s)).  Procedures and diagnostic studies:  Dg Chest 2 View  Result Date: 10/30/2017 CLINICAL DATA:  Shortness of breath, intermittent chest pressure for 3 days. Nausea and vomiting. Sleep difficulties. History of COPD and CHF. EXAM: CHEST - 2 VIEW COMPARISON:  Chest CT October 20, 2017. FINDINGS: Stable cardiomegaly. Calcified aortic arch. Diffuse interstitial prominence without pleural effusion or focal consolidation. Mild biapical pleural thickening. Pulmonary vasculature appears normal. No pneumothorax. Soft tissue planes and included osseous structures are non suspicious. Partially imaged aorta Eck stent graft. CT of material anterior abdominal wall. Osteopenia. IMPRESSION: Stable  cardiomegaly. Chronic interstitial lung disease without focal consolidation. Aortic Atherosclerosis (ICD10-I70.0). Electronically Signed   By: Elon Alas M.D.   On: 10/30/2017 16:13    Medications:   . allopurinol  300 mg Oral Daily  . aspirin EC  81 mg Oral Daily  . digoxin  0.125 mg Oral Daily  . furosemide  80 mg Intravenous BID  . gabapentin  300 mg Oral BID  . isosorbide mononitrate  30 mg Oral Daily  . levothyroxine  25 mcg Oral QAC breakfast  . metoprolol tartrate  12.5 mg Oral BID  . montelukast  10 mg Oral QHS  . pantoprazole  40 mg Oral Daily  . potassium chloride SA  40 mEq Oral Daily  . pravastatin  40 mg Oral Daily  . prednisoLONE acetate  1 drop Both Eyes BID  . tiotropium  18 mcg Inhalation Daily   Continuous Infusions:   LOS: 0 days   Geradine Girt  [facility]   *Please refer to [email or web address], password [email or web address] to get updated schedule on who will round on this patient, as hospitalists switch teams weekly. If 7PM-7AM, please contact night-coverage at [email or web address], password [email or web address] for any overnight needs.  11/01/2017, 2:26 PM

## 2017-11-01 NOTE — Plan of Care (Signed)
Discussed with patient plan of care for the evening, pain management and various medical and non-medical ways for sleep with some teach back displayed

## 2017-11-02 DIAGNOSIS — I10 Essential (primary) hypertension: Secondary | ICD-10-CM

## 2017-11-02 DIAGNOSIS — I482 Chronic atrial fibrillation: Secondary | ICD-10-CM

## 2017-11-02 DIAGNOSIS — N183 Chronic kidney disease, stage 3 (moderate): Secondary | ICD-10-CM

## 2017-11-02 LAB — PROTIME-INR
INR: 2.94
Prothrombin Time: 30.5 seconds — ABNORMAL HIGH (ref 11.4–15.2)

## 2017-11-02 LAB — BASIC METABOLIC PANEL
ANION GAP: 10 (ref 5–15)
BUN: 25 mg/dL — ABNORMAL HIGH (ref 8–23)
CALCIUM: 9.3 mg/dL (ref 8.9–10.3)
CO2: 26 mmol/L (ref 22–32)
Chloride: 101 mmol/L (ref 98–111)
Creatinine, Ser: 1.91 mg/dL — ABNORMAL HIGH (ref 0.44–1.00)
GFR calc Af Amer: 28 mL/min — ABNORMAL LOW (ref >60)
GFR calc non Af Amer: 25 mL/min — ABNORMAL LOW (ref >60)
GLUCOSE: 94 mg/dL (ref 70–99)
Potassium: 3.6 mmol/L (ref 3.5–5.1)
Sodium: 137 mmol/L (ref 135–145)

## 2017-11-02 LAB — CBC
HCT: 43 % (ref 36.0–46.0)
Hemoglobin: 13.6 g/dL (ref 12.0–15.0)
MCH: 29.6 pg (ref 26.0–34.0)
MCHC: 31.6 g/dL (ref 30.0–36.0)
MCV: 93.5 fL (ref 78.0–100.0)
PLATELETS: 225 10*3/uL (ref 150–400)
RBC: 4.6 MIL/uL (ref 3.87–5.11)
RDW: 15.4 % (ref 11.5–15.5)
WBC: 7.7 10*3/uL (ref 4.0–10.5)

## 2017-11-02 MED ORDER — METOPROLOL SUCCINATE ER 25 MG PO TB24: 25.0000 mg | ORAL_TABLET | Freq: Every day | ORAL | 0 refills | Status: DC

## 2017-11-02 MED ORDER — GABAPENTIN 600 MG PO TABS: 300.0000 mg | ORAL_TABLET | Freq: Two times a day (BID) | ORAL | Status: DC

## 2017-11-02 MED ORDER — TORSEMIDE 20 MG PO TABS: 80.0000 mg | ORAL_TABLET | Freq: Every day | ORAL | 0 refills | Status: DC

## 2017-11-02 MED ORDER — METOPROLOL SUCCINATE ER 25 MG PO TB24: 25.0000 mg | ORAL_TABLET | Freq: Every day | ORAL | Status: DC

## 2017-11-02 MED ORDER — MELATONIN 3 MG PO TABS: 3.0000 mg | ORAL_TABLET | Freq: Every evening | ORAL | 0 refills | Status: DC | PRN

## 2017-11-02 MED ORDER — WARFARIN SODIUM 3 MG PO TABS
3.0000 mg | ORAL_TABLET | Freq: Every morning | ORAL | Status: DC
  Administered 2017-11-02: 3 mg via ORAL
  Filled 2017-11-02: qty 1

## 2017-11-02 MED ORDER — WARFARIN - PHYSICIAN DOSING INPATIENT: Freq: Every day | Status: DC

## 2017-11-02 NOTE — Discharge Summary (Signed)
Physician Discharge Summary  DATHA KISSINGER AST:[id number] DOB: [birth year] DOA: 10/30/2017  PCP: [name], MD  Admit date: 10/30/2017 Discharge date: 11/02/2017  Admitted From: home Discharge disposition: home   Recommendations for Outpatient Follow-Up:   1. Follow up with Dr. Lovena Le for CRT consideration 2. Outpatient sleep study 3. Sleep hygiene discussed as well 4. INR/BMP at next visit   Discharge Diagnosis:   Principal Problem:   Acute respiratory failure with hypoxia (HCC) Active Problems:   Essential hypertension   Atrial fibrillation (HCC)   Hypothyroidism   CKD  stage III (GFR 51 ml/min)   Pulmonary Fibrosis sequellae of Amiodarone   PAH (pulmonary artery hypertension) (HCC)   Chronic systolic CHF (congestive heart failure) (HCC)   Acute on chronic combined systolic and diastolic CHF (congestive heart failure) (HCC)   Shortness of breath    Discharge Condition: Improved.  Diet recommendation: Low sodium, heart healthy.  Wound care: None.  Code status: Full.   History of Present Illness:   Katie [name] is a 77 y.o. female with history of atrial fibrillation, COPD, interstitial lung disease, hypothyroidism has been having chronic shortness of breath which is acutely worsened with increasing peripheral edema.  Patient had recently followed up with her pulmonologist and cardiologist.  Has had stress test and HRCT done.  Last few days patient shortness of breath herself had to come to the ER.  During her last cardiology visit stress test done showed EF of 34% with no definite signs of ischemia.  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  Her cardiologist had decreased her dose of Lasix to 120 mg daily from 80 mg.  Despite taking which patient still has lower extremity edema and shortness of breath.  Denies any chest pain.   Hospital Course by Problem:   Per cardiology: Shortness of breath: LE edema, orthopnea, DOE, elevated  JVD and basilar crackles support volume overload.  -She feels improved from admission but still has some residual SOB with lying in bed. Has ambulated without difficulty. Unclear if her O2 sats drop when flat or if it is a comfort preference. -Legs improving, but with a component of venous insufficiency they may always have some edema. Counseled on elevation. She did not like compression hose. -Echo : EF is down to 35-40% with dyssynchrony, and her PA pressures are mildly elevated. Dr. Lovena Le will see her as an outpatient (already scheduled for September) to discuss whether she is a good candidate for CRT -given her residual shortness of breath despite improved lung aeration and diuresis (including mild bump in creatinine) she still endorses some shortness of breath. I am not sure what the etiology of this is. I again offered RHC as she had initially wanted the information, but she declines. Will restart her coumadin today (she takes in AM). She will have her INR followed at her PCP office.  -increased torsemide, first oral dose today. Offered to watch her urine output and renal function on this today, she declined and would like to go home. Gave guidance on daily weights, when to call the office. She expressed frustration that she feels like she is always sent to the ER when she calls; discussed that chest pain and shortness of breath can be medical emergencies. Gave her my office info as a backup if she has difficulty getting through to [employer].  -in terms of heart failure medications, changed to metoprolol succinate. Given changes in renal function, cannot start  ACEI (valsartan in allergy list). Will need to wait until outpatient to make sure renal function is stable -continue imdur -continue aspirin, pravastatin.  -continue digoxin -melatonin to sleep, nonformulary request -restarted coumadin -recommend sleep study as an outpatient.    Medical Consultants:      Discharge Exam:     Vitals:   11/02/17 0923 11/02/17 0951  BP: 116/78   Pulse: 76 74  Resp:    Temp: (!) 97.3 F (36.3 C)   SpO2: 95%    Vitals:   11/01/17 2125 11/02/17 0600 11/02/17 0923 11/02/17 0951  BP: (!) 117/91  116/78   Pulse: 94  76 74  Resp: 18     Temp: (!) 97.5 F (36.4 C)  (!) 97.3 F (36.3 C)   TempSrc: Oral  Oral   SpO2: 96%  95%   Weight:  91.2 kg    Height:        General exam: Appears calm and comfortable. No increased work of breathing.  Walking laps around unit circle   The results of significant diagnostics from this hospitalization (including imaging, microbiology, ancillary and laboratory) are listed below for reference.     Procedures and Diagnostic Studies:   Dg Chest 2 View  Result Date: 10/30/2017 CLINICAL DATA:  Shortness of breath, intermittent chest pressure for 3 days. Nausea and vomiting. Sleep difficulties. History of COPD and CHF. EXAM: CHEST - 2 VIEW COMPARISON:  Chest CT October 20, 2017. FINDINGS: Stable cardiomegaly. Calcified aortic arch. Diffuse interstitial prominence without pleural effusion or focal consolidation. Mild biapical pleural thickening. Pulmonary vasculature appears normal. No pneumothorax. Soft tissue planes and included osseous structures are non suspicious. Partially imaged aorta Eck stent graft. CT of material anterior abdominal wall. Osteopenia. IMPRESSION: Stable cardiomegaly. Chronic interstitial lung disease without focal consolidation. Aortic Atherosclerosis (ICD10-I70.0). Electronically Signed   By: Elon Alas M.D.   On: 10/30/2017 16:13     Labs:   Basic Metabolic Panel: Recent Labs  Lab 10/30/17 1549 10/31/17 0735 11/01/17 0309 11/02/17 0334  NA 140 142 138 137  K 3.9 3.7 4.0 3.6  CL 100 101 101 101  CO2 24 27 27 26   GLUCOSE 101* 105* 109* 94  BUN 12 15 19  25*  CREATININE 1.44* 1.50* 1.81* 1.91*  CALCIUM 10.6* 9.8 9.5 9.3   GFR Estimated Creatinine Clearance: 27.3 mL/min (A) (by C-G formula based on SCr of  1.91 mg/dL (H)). Liver Function Tests: Recent Labs  Lab 10/31/17 0735  AST 17  ALT 15  ALKPHOS 36*  BILITOT 1.1  PROT 6.3*  ALBUMIN 3.6   No results for input(s): LIPASE, AMYLASE in the last 168 hours. No results for input(s): AMMONIA in the last 168 hours. Coagulation profile Recent Labs  Lab 10/30/17 1549 11/01/17 0309 11/02/17 0334  INR 3.04 3.57 2.94    CBC: Recent Labs  Lab 10/30/17 1549 10/31/17 0735 11/01/17 0309 11/02/17 0334  WBC 7.9 8.1 8.4 7.7  NEUTROABS  --  5.7  --   --   HGB 15.0 14.4 14.7 13.6  HCT 47.4* 45.5 46.7* 43.0  MCV 92.9 91.7 93.0 93.5  PLT 243 234 219 225   Cardiac Enzymes: Recent Labs  Lab 10/30/17 2343 10/31/17 0735 10/31/17 1117  TROPONINI 0.04* 0.05* 0.04*   BNP: Invalid input(s): POCBNP CBG: No results for input(s): GLUCAP in the last 168 hours. D-Dimer No results for input(s): DDIMER in the last 72 hours. Hgb A1c No results for input(s): HGBA1C in the last 72 hours. Lipid  Profile No results for input(s): CHOL, HDL, LDLCALC, TRIG, CHOLHDL, LDLDIRECT in the last 72 hours. Thyroid function studies No results for input(s): TSH, T4TOTAL, T3FREE, THYROIDAB in the last 72 hours.  Invalid input(s): FREET3 Anemia work up No results for input(s): VITAMINB12, FOLATE, FERRITIN, TIBC, IRON, RETICCTPCT in the last 72 hours. Microbiology No results found for this or any previous visit (from the past 240 hour(s)).   Discharge Instructions:   Discharge Instructions    (HEART FAILURE PATIENTS) Call MD:  Anytime you have any of the following symptoms: 1) 3 pound weight gain in 24 hours or 5 pounds in 1 week 2) shortness of breath, with or without a dry hacking cough 3) swelling in the hands, feet or stomach 4) if you have to sleep on extra pillows at night in order to breathe.   Complete by:  As directed    Diet - low sodium heart healthy   Complete by:  As directed    Discharge instructions   Complete by:  As directed    INR and  BMP friday   Increase activity slowly   Complete by:  As directed      Allergies as of 11/02/2017      Reactions   Amiodarone Other (See Comments)   PULMONARY TOXICITY   Diovan [valsartan] Other (See Comments)   HYPOTENSION   Doxycycline Diarrhea, Other (See Comments)   VISUAL DISTURBANCE   Flexeril [cyclobenzaprine] Other (See Comments)   FATIGUE   Keflex [cephalexin] Diarrhea   Verapamil Other (See Comments)   EDEMA   Codeine Hives      Medication List    STOP taking these medications   buPROPion 150 MG 24 hr tablet Commonly known as:  WELLBUTRIN XL   diltiazem 120 MG 24 hr capsule Commonly known as:  TIAZAC   ipratropium 0.02 % nebulizer solution Commonly known as:  ATROVENT   levalbuterol 45 MCG/ACT inhaler Commonly known as:  XOPENEX HFA     TAKE these medications   acetaminophen 500 MG tablet Commonly known as:  TYLENOL Take 1,000 mg by mouth every 6 (six) hours as needed for moderate pain.   albuterol 108 (90 Base) MCG/ACT inhaler Commonly known as:  PROVENTIL HFA;VENTOLIN HFA 2 inhalations 10-15 minutes apart evert 4 hours as needed for Asthma Flare What changed:    how much to take  how to take this  when to take this  reasons to take this  additional instructions   allopurinol 300 MG tablet Commonly known as:  ZYLOPRIM Take 300 mg by mouth daily.   aspirin EC 81 MG tablet Take 81 mg by mouth daily.   digoxin 0.125 MG tablet Commonly known as:  LANOXIN Take 0.125 mg by mouth daily.   gabapentin 600 MG tablet Commonly known as:  NEURONTIN Take 0.5 tablets (300 mg total) by mouth 2 (two) times daily.   ipratropium 0.06 % nasal spray Commonly known as:  ATROVENT Use 1 to 2 sprays each Nostril 2 to 3 x / day as needed What changed:    how much to take  how to take this  when to take this  reasons to take this  additional instructions   ipratropium-albuterol 0.5-2.5 (3) MG/3ML Soln Commonly known as:  DUONEB Take 3 mLs by  nebulization every 4 (four) hours as needed (shortness of breath).   isosorbide mononitrate 30 MG 24 hr tablet Commonly known as:  IMDUR Take 1 tablet daily for BP & Heart What changed:  how much to take  how to take this  when to take this  additional instructions   levothyroxine 50 MCG tablet Commonly known as:  SYNTHROID, LEVOTHROID Take 0.5 tablets (25 mcg total) by mouth daily before breakfast.   Melatonin 3 MG Tabs Take 1 tablet (3 mg total) by mouth at bedtime as needed (insomnia).   metoprolol succinate 25 MG 24 hr tablet Commonly known as:  TOPROL-XL Take 1 tablet (25 mg total) by mouth daily. Start taking on:  11/03/2017   montelukast 10 MG tablet Commonly known as:  SINGULAIR Take 10 mg by mouth at bedtime.   MUCINEX PO Take 1 tablet by mouth daily as needed (runny nose).   Olopatadine HCl 0.2 % Soln Place 1 drop into both eyes daily.   pantoprazole 40 MG tablet Commonly known as:  PROTONIX Take 1 tablet (40 mg total) by mouth daily.   potassium chloride SA 20 MEQ tablet Commonly known as:  K-DUR,KLOR-CON TAKE 2 TABLETS (40 MEQ TOTAL) BY MOUTH DAILY. What changed:    how much to take  how to take this  when to take this  additional instructions   pravastatin 40 MG tablet Commonly known as:  PRAVACHOL Take 40 mg by mouth daily.   prednisoLONE acetate 1 % ophthalmic suspension Commonly known as:  PRED FORTE Place 1 drop into both eyes 2 (two) times daily.   prochlorperazine 5 MG tablet Commonly known as:  COMPAZINE TAKE 1 TABLET BY MOUTH THREE TIMES A DAY AS NEEDED FOR VERTIGO OR NAUSEA What changed:  See the new instructions.   SYSTANE OP Place 1 drop into both eyes 4 (four) times daily as needed (dry eyes).   torsemide 20 MG tablet Commonly known as:  DEMADEX Take 4 tablets (80 mg total) by mouth daily. Start taking on:  11/03/2017 What changed:    how much to take  when to take this   triamcinolone cream 0.1 % Commonly known  as:  KENALOG Apply 1 application topically daily as needed (foot itching).   TUDORZA PRESSAIR 400 MCG/ACT Aepb Generic drug:  Aclidinium Bromide Inhale 1 puff into the lungs 2 (two) times daily.   Vitamin D3 5000 units Caps Take 5,000 Units by mouth daily.   warfarin 2 MG tablet Commonly known as:  COUMADIN Take 2-3 mg by mouth See admin instructions. Take 1 tablet (2 mg) by mouth on Thursday, Friday, Saturday morning, take 1 1/2 tablets (3 mg) on Sunday, Monday, Tuesday, Wednesday morning. What changed:  Another medication with the same name was removed. Continue taking this medication, and follow the directions you see here.      Follow-up Information    [name], MD Follow up in 1 week(s).   Specialty:  Internal Medicine Why:  follow INR and BMP Contact information: [address] [place] [address] [phone]        Evans Lance, MD Follow up on 11/04/2017.   Specialty:  Cardiology Why:  Please arrive 15 minutes early for your 11:30am cardiology appointment Contact information: [address] [address] [phone]            Time coordinating discharge: 35 min  Signed:  Geradine Girt  [facility] 11/02/2017, 1:57 PM

## 2017-11-02 NOTE — Progress Notes (Signed)
Progress Note  Patient Name: Katie [name] Date of Encounter: 11/02/2017  Primary Cardiologist: Cristopher Peru  Subjective   Patient feels like she has lost even more fluid, but she does still note some mild shortness of breath. She has difficulty sleeping, felt better with oxygen on last night but then later slept fine with the O2 off. Falls asleep easily during the day.   We discussed her kidney function, the plan for her water pill, and Dr. Tanna Furry recommendation for CRT evaluation as an outpatient. She had many questions, all of which were answered.  Inpatient Medications    Scheduled Meds: . allopurinol  300 mg Oral Daily  . aspirin EC  81 mg Oral Daily  . digoxin  0.125 mg Oral Daily  . gabapentin  300 mg Oral BID  . isosorbide mononitrate  30 mg Oral Daily  . levothyroxine  25 mcg Oral QAC breakfast  . [START ON 11/03/2017] metoprolol succinate  25 mg Oral Daily  . montelukast  10 mg Oral QHS  . pantoprazole  40 mg Oral Daily  . potassium chloride SA  40 mEq Oral Daily  . pravastatin  40 mg Oral Daily  . prednisoLONE acetate  1 drop Both Eyes BID  . tiotropium  18 mcg Inhalation Daily  . torsemide  80 mg Oral Daily  . warfarin  3 mg Oral q morning - 10a   Continuous Infusions:  PRN Meds: acetaminophen **OR** acetaminophen, albuterol, guaiFENesin, hydrOXYzine, ipratropium-albuterol, Melatonin, ondansetron **OR** ondansetron (ZOFRAN) IV   Vital Signs    Vitals:   11/01/17 2125 11/02/17 0600 11/02/17 0923 11/02/17 0951  BP: (!) 117/91  116/78   Pulse: 94  76 74  Resp: 18     Temp: (!) 97.5 F (36.4 C)  (!) 97.3 F (36.3 C)   TempSrc: Oral  Oral   SpO2: 96%  95%   Weight:  91.2 kg    Height:        Intake/Output Summary (Last 24 hours) at 11/02/2017 1044 Last data filed at 11/02/2017 1000 Gross per 24 hour  Intake 780 ml  Output 2800 ml  Net -2020 ml   Filed Weights   10/31/17 0500 11/01/17 0500 11/02/17 0600  Weight: 91.4 kg 92.1 kg 91.2 kg     Telemetry    Atrial fibrillation with PVCs - Personally Reviewed  ECG    Atrial fibrillation, LBBB - Personally Reviewed  Physical Exam   GEN: No acute distress.   Neck: supple, JVD not visible at 90 degrees Cardiac: irregularly irregular S1 and S2, 2/6 systolic murmur, no rubs or gallops.  Respiratory: clear throughout, no crackles today GI: Soft, nontender, non-distended. Bowel sounds normal MS: 1+ bilateral LE edema, with skin discoloration c/w venous insufficiency, improved from prior Neuro:  Nonfocal, moves all limbs independently Psych: Normal affect though anxious  Labs    Chemistry Recent Labs  Lab 10/31/17 0735 11/01/17 0309 11/02/17 0334  NA 142 138 137  K 3.7 4.0 3.6  CL 101 101 101  CO2 27 27 26   GLUCOSE 105* 109* 94  BUN 15 19 25*  CREATININE 1.50* 1.81* 1.91*  CALCIUM 9.8 9.5 9.3  PROT 6.3*  --   --   ALBUMIN 3.6  --   --   AST 17  --   --   ALT 15  --   --   ALKPHOS 36*  --   --   BILITOT 1.1  --   --   GFRNONAA 33*  26* 25*  GFRAA 38* 30* 28*  ANIONGAP 14 10 10      Hematology Recent Labs  Lab 10/31/17 0735 11/01/17 0309 11/02/17 0334  WBC 8.1 8.4 7.7  RBC 4.96 5.02 4.60  HGB 14.4 14.7 13.6  HCT 45.5 46.7* 43.0  MCV 91.7 93.0 93.5  MCH 29.0 29.3 29.6  MCHC 31.6 31.5 31.6  RDW 15.4 15.4 15.4  PLT 234 219 225    Cardiac Enzymes Recent Labs  Lab 10/30/17 2343 10/31/17 0735 10/31/17 1117  TROPONINI 0.04* 0.05* 0.04*    Recent Labs  Lab 10/30/17 1600  TROPIPOC 0.02     BNPNo results for input(s): BNP, PROBNP in the last 168 hours.   DDimer No results for input(s): DDIMER in the last 168 hours.   Radiology    No results found.  Cardiac Studies   Echo 11/01/17 Left ventricle: The cavity size was mildly dilated. There was   moderate focal basal and mild concentric hypertrophy. Systolic   function was moderately reduced. The estimated ejection fraction   was in the range of 35% to 40%. Severe diffuse hypokinesis  with   regional variations. The study was not technically sufficient to   allow evaluation of LV diastolic dysfunction due to atrial   fibrillation. - Ventricular septum: Septal motion showed abnormal function. These   changes are consistent with intraventricular conduction delay. - Aortic valve: Moderately calcified annulus. Trileaflet; mildly   thickened, mildly calcified leaflets. There was mild stenosis.   There was mild regurgitation. Mean gradient (S): 9 mm Hg. Valve   area (VTI): 1.19 cm^2. Valve area (Vmax): 1.36 cm^2. Valve area   (Vmean): 1.25 cm^2. - Aorta: Aortic root dimension: 39 mm (ED). - Aortic root: The aortic root was mildly dilated. - Mitral valve: Calcified annulus. There was mild regurgitation. - Left atrium: The atrium was mildly dilated. - Right atrium: The atrium was moderately to severely dilated. - Tricuspid valve: There was mild regurgitation. - Pulmonic valve: There was trivial regurgitation. - Pulmonary arteries: PA peak pressure: 37 mm Hg (S). - Impressions: The findings indicate significant septal-lateral   left ventricular wall dyssynchrony.  Impressions:  - Compared to prior echo LV function appears worse but may be due   to significant septal lateral dyssynchrony from LBBB. The   findings indicate significant septal-lateral left ventricular   wall dyssynchrony. The right ventricular systolic pressure was   increased consistent with mild pulmonary hypertension.  Patient Profile     77 y.o. female history of CAD, CKD III, COPD, atrial fibrillation, HLD, PAD, chronic shortness of breath, and chronic chest pain who presents with acute on chronic shortness of breath and chest pain. She is seen as a consult at the request of Dr. Sherryl Barters. Eliseo Squires for management of her possible heart failure.  Assessment & Plan    Shortness of breath: LE edema, orthopnea, DOE, elevated JVD and basilar crackles support volume overload.  -She feels improved from  admission but still has some residual SOB with lying in bed. Has ambulated without difficulty. Unclear if her O2 sats drop when flat or if it is a comfort preference. -Legs improving, but with a component of venous insufficiency they may always have some edema. Counseled on elevation. She did not like compression hose. -Echo : EF is down to 35-40% with dyssynchrony, and her PA pressures are mildly elevated. Dr. Lovena Le will see her as an outpatient (already scheduled for September) to discuss whether she is a good candidate for CRT -given her  residual shortness of breath despite improved lung aeration and diuresis (including mild bump in creatinine) she still endorses some shortness of breath. I am not sure what the etiology of this is. I again offered RHC as she had initially wanted the information, but she declines. Will restart her coumadin today (she takes in AM). She will have her INR followed at her PCP office.  -increased torsemide, first oral dose today. Offered to watch her urine output and renal function on this today, she declined and would like to go home. Gave guidance on daily weights, when to call the office. She expressed frustration that she feels like she is always sent to the ER when she calls; discussed that chest pain and shortness of breath can be medical emergencies. Gave her my office info as a backup if she has difficulty getting through to [employer].  -in terms of heart failure medications, changed to metoprolol succinate. Given changes in renal function, cannot start ACEI (valsartan in allergy list). Will need to wait until outpatient to make sure renal function is stable -continue imdur -continue aspirin, pravastatin.  -continue digoxin -melatonin to sleep, nonformulary request -restarted coumadin -recommend sleep study as an outpatient.   Time Spent Directly with Patient: I have spent a total of 45 minutes with the patient reviewing hospital notes, telemetry,  EKGs, labs and examining the patient as well as establishing an assessment and plan that was discussed personally with the patient.  > 50% of time was spent in direct patient care.  Length of Stay:  LOS: 1 day   Buford Dresser, MD, PhD [facility]  [facility]   11/02/2017, 10:44 AM   [facility] will sign off in anticipation of discharge.   Medication Recommendations:  See note above Other recommendations (labs, testing, etc):  BMET, INR early next week Follow up as an outpatient:  Has appt scheduled with Dr. Lovena Le.  For questions or updates, please contact [place] Please consult [email or web address] for contact info under Cardiology/STEMI.

## 2017-11-02 NOTE — Plan of Care (Signed)
Discussed with patient plan of care for the evening, pain management and process the CRT the doctor wants to perform with some teach back displayed.

## 2017-11-04 ENCOUNTER — Ambulatory Visit: Payer: Medicare Other | Admitting: Internal Medicine

## 2017-11-04 ENCOUNTER — Encounter: Payer: Self-pay | Admitting: Internal Medicine

## 2017-11-04 VITALS — BP 130/76 | HR 64 | Ht 63.0 in | Wt 202.6 lb

## 2017-11-04 DIAGNOSIS — I10 Essential (primary) hypertension: Secondary | ICD-10-CM | POA: Diagnosis not present

## 2017-11-04 DIAGNOSIS — I5042 Chronic combined systolic (congestive) and diastolic (congestive) heart failure: Secondary | ICD-10-CM

## 2017-11-04 DIAGNOSIS — I482 Chronic atrial fibrillation: Secondary | ICD-10-CM

## 2017-11-04 DIAGNOSIS — I4821 Permanent atrial fibrillation: Secondary | ICD-10-CM

## 2017-11-04 NOTE — Progress Notes (Signed)
HPI Katie [name] returns today for followup. She is a pleasant 77 yo woman with chronic systolic and diastolic heart failure, HTN, a markedly reduced diffusion capacity, and now worsening LV dysfunction by echo. Her EF has gone from 40-45% down to 35%. She has had progression of her QRS duration from the 125-130 range to the 145 range. She has class 3 symptoms. She now has LBBB. She has chronic peripheral edema. On 2 D echo she was described as having marked ventricular dysynchrony.  Allergies  Allergen Reactions  . Amiodarone Other (See Comments)    PULMONARY TOXICITY  . Diovan [Valsartan] Other (See Comments)    HYPOTENSION  . Doxycycline Diarrhea and Other (See Comments)    VISUAL DISTURBANCE  . Flexeril [Cyclobenzaprine] Other (See Comments)    FATIGUE  . Keflex [Cephalexin] Diarrhea  . Verapamil Other (See Comments)    EDEMA  . Codeine Hives     Current Outpatient Medications  Medication Sig Dispense Refill  . acetaminophen (TYLENOL) 500 MG tablet Take 1,000 mg by mouth every 6 (six) hours as needed for moderate pain.     . Aclidinium Bromide (TUDORZA PRESSAIR) 400 MCG/ACT AEPB Inhale 1 puff into the lungs 2 (two) times daily.    Marland Kitchen albuterol (PROAIR HFA) 108 (90 Base) MCG/ACT inhaler 2 inhalations 10-15 minutes apart evert 4 hours as needed for Asthma Flare (Patient taking differently: Inhale 2 puffs into the lungs every 4 (four) hours as needed for shortness of breath (asthma flare). ) 48 g 3  . allopurinol (ZYLOPRIM) 300 MG tablet Take 300 mg by mouth daily.    Marland Kitchen aspirin EC 81 MG tablet Take 81 mg by mouth daily.    . Cholecalciferol (VITAMIN D3) 5000 units CAPS Take 5,000 Units by mouth daily.     . digoxin (LANOXIN) 0.125 MG tablet Take 0.125 mg by mouth daily.    Marland Kitchen gabapentin (NEURONTIN) 600 MG tablet Take 0.5 tablets (300 mg total) by mouth 2 (two) times daily.    Marland Kitchen guaiFENesin (MUCINEX PO) Take 1 tablet by mouth daily as needed (runny nose).    Marland Kitchen ipratropium  (ATROVENT) 0.06 % nasal spray Use 1 to 2 sprays each Nostril 2 to 3 x / day as needed (Patient taking differently: Place 1-2 sprays into both nostrils 3 (three) times daily as needed for rhinitis (congestion). ) 45 mL 3  . ipratropium-albuterol (DUONEB) 0.5-2.5 (3) MG/3ML SOLN Take 3 mLs by nebulization every 4 (four) hours as needed (shortness of breath).    . isosorbide mononitrate (IMDUR) 30 MG 24 hr tablet Take 1 tablet daily for BP & Heart (Patient taking differently: Take 30 mg by mouth daily. for BP & Heart) 90 tablet 1  . levothyroxine (SYNTHROID, LEVOTHROID) 50 MCG tablet Take 0.5 tablets (25 mcg total) by mouth daily before breakfast. 90 tablet 1  . Melatonin 3 MG TABS Take 1 tablet (3 mg total) by mouth at bedtime as needed (insomnia).  0  . metoprolol succinate (TOPROL-XL) 25 MG 24 hr tablet Take 1 tablet (25 mg total) by mouth daily. 30 tablet 0  . montelukast (SINGULAIR) 10 MG tablet Take 10 mg by mouth at bedtime.    . Olopatadine HCl 0.2 % SOLN Place 1 drop into both eyes daily.     . pantoprazole (PROTONIX) 40 MG tablet Take 1 tablet (40 mg total) by mouth daily.    [name] Glycol-Propyl Glycol (SYSTANE OP) Place 1 drop into both eyes 4 (four) times  daily as needed (dry eyes).     . potassium chloride SA (KLOR-CON M20) 20 MEQ tablet TAKE 2 TABLETS (40 MEQ TOTAL) BY MOUTH DAILY. (Patient taking differently: Take 40 mEq by mouth daily. ) 180 tablet 1  . pravastatin (PRAVACHOL) 40 MG tablet Take 40 mg by mouth daily.    . prednisoLONE acetate (PRED FORTE) 1 % ophthalmic suspension Place 1 drop into both eyes 2 (two) times daily.   1  . prochlorperazine (COMPAZINE) 5 MG tablet TAKE 1 TABLET BY MOUTH THREE TIMES A DAY AS NEEDED FOR VERTIGO OR NAUSEA (Patient taking differently: Take 5 mg by mouth daily as needed for nausea (vertigo). ) 60 tablet 1  . torsemide (DEMADEX) 20 MG tablet Take 4 tablets (80 mg total) by mouth daily. 120 tablet 0  . triamcinolone cream (KENALOG) 0.1 % Apply 1  application topically daily as needed (foot itching).     . warfarin (COUMADIN) 2 MG tablet Take 2-3 mg by mouth See admin instructions. Take 1 tablet (2 mg) by mouth on Thursday, Friday, Saturday morning, take 1 1/2 tablets (3 mg) on Sunday, Monday, Tuesday, Wednesday morning.     No current facility-administered medications for this visit.      Past Medical History:  Diagnosis Date  . AAA (abdominal aortic aneurysm) ([place])   . Atrial fibrillation ([place])   . CHF (congestive heart failure) ([place])   . COPD (chronic obstructive pulmonary disease) ([place])   . Depression   . GERD (gastroesophageal reflux disease)   . Hyperlipidemia   . Hypertension   . Hypothyroid   . Migraine headache   . Osteopenia   . PVD (peripheral vascular disease) ([place])     ROS:   All systems reviewed and negative except as noted in the HPI.   Past Surgical History:  Procedure Laterality Date  . ABDOMINAL AORTIC ENDOVASCULAR STENT GRAFT N/A 04/11/2013   Procedure: ABDOMINAL AORTIC ENDOVASCULAR STENT GRAFT WITH RIGHT FEMORAL PATCH ANGIOPLASTY;  Surgeon: Mal Misty, MD;  Location: [facility];  Service: Vascular;  Laterality: N/A;  . BREAST SURGERY     LEFT BREAST BIOPSY  . broken leg Left 1970s  . CARDIAC CATHETERIZATION    . CHOLECYSTECTOMY  1972  . COLONOSCOPY    . EYE SURGERY Bilateral   . Laser vein procedure    . REFRACTIVE SURGERY    . TONSILLECTOMY       Family History  Problem Relation Age of Onset  . Cirrhosis Mother   . Cancer Mother 69       PANCREAS  . Heart defect Sister   . Breast cancer Sister        age 61  . Heart disease Sister   . Stroke Sister   . Alcohol abuse Father   . Depression Father   . Hypertension Brother   . Hyperlipidemia Son   . Heart disease Daughter      Social History   Socioeconomic History  . Marital status: Married    Spouse name: Not on file  . Number of children: Not on file  . Years of education: Not on file  . Highest education level: Not on  file  Occupational History  . Not on file  Social Needs  . Financial resource strain: Somewhat hard  . Food insecurity:    Worry: Never true    Inability: Never true  . Transportation needs:    Medical: No    Non-medical: No  Tobacco Use  . Smoking status:  Former Smoker    Types: Cigarettes    Last attempt to quit: 08/10/2002    Years since quitting: 15.2  . Smokeless tobacco: Never Used  . Tobacco comment: History of tobacco abuse  Substance and Sexual Activity  . Alcohol use: Yes    Alcohol/week: 0.0 standard drinks    Comment: rare  . Drug use: No  . Sexual activity: Yes    Birth control/protection: Post-menopausal  Lifestyle  . Physical activity:    Days per week: 3 days    Minutes per session: 40 min  . Stress: Not at all  Relationships  . Social connections:    Talks on phone: More than three times a week    Gets together: Twice a week    Attends religious service: More than 4 times per year    Active member of club or organization: Yes    Attends meetings of clubs or organizations: Never    Relationship status: Married  . Intimate partner violence:    Fear of current or ex partner: No    Emotionally abused: No    Physically abused: No    Forced sexual activity: No  Other Topics Concern  . Not on file  Social History Narrative   Lives in [place] with husband   One daughter     BP 130/76   Pulse 64   Ht 5\' 3"  (1.6 m)   Wt 202 lb 9.6 oz (91.9 kg)   SpO2 97%   BMI 35.89 kg/m   Physical Exam:  Well appearing 77 yo woman, NAD HEENT: Unremarkable Neck:  6 cm JVD, no thyromegally Lymphatics:  No adenopathy Back:  No CVA tenderness Lungs:  Clear with no wheezes HEART:  Regular rate rhythm, no murmurs, no rubs, no clicks Abd:  soft, positive bowel sounds, no organomegally, no rebound, no guarding Ext:  2 plus pulses, no edema, no cyanosis, no clubbing Skin:  No rashes no nodules Neuro:  CN II through XII intact, motor grossly intact  EKG - NSR  with LBBB   Assess/Plan: 1. Chronic systolic heart failure - she has had progression of her disease. She has been unable to take an ACE inhibitors in the past due to renal dysfunction and had a cough on beta blockers. Her symptoms are class 3. I have discussed the indications/risks/benefits/goals/expectations of BiV ICD insertion. We discussed BIV PPM vs ICD. She would like the ICD.  2. Atrial fib - she is chronically in atrial fib. She will continue rate control. I might try and rechallenge her with toprol after her device is placed.   I spent over 25 minutes including over 50% face-face time with this patient producing her note.  Mikle Bosworth.D.

## 2017-11-04 NOTE — H&P (View-Only) (Signed)
HPI Katie [name] returns today for followup. She is a pleasant 77 yo woman with chronic systolic and diastolic heart failure, HTN, a markedly reduced diffusion capacity, and now worsening LV dysfunction by echo. Her EF has gone from 40-45% down to 35%. She has had progression of her QRS duration from the 125-130 range to the 145 range. She has class 3 symptoms. She now has LBBB. She has chronic peripheral edema. On 2 D echo she was described as having marked ventricular dysynchrony.  Allergies  Allergen Reactions  . Amiodarone Other (See Comments)    PULMONARY TOXICITY  . Diovan [Valsartan] Other (See Comments)    HYPOTENSION  . Doxycycline Diarrhea and Other (See Comments)    VISUAL DISTURBANCE  . Flexeril [Cyclobenzaprine] Other (See Comments)    FATIGUE  . Keflex [Cephalexin] Diarrhea  . Verapamil Other (See Comments)    EDEMA  . Codeine Hives     Current Outpatient Medications  Medication Sig Dispense Refill  . acetaminophen (TYLENOL) 500 MG tablet Take 1,000 mg by mouth every 6 (six) hours as needed for moderate pain.     . Aclidinium Bromide (TUDORZA PRESSAIR) 400 MCG/ACT AEPB Inhale 1 puff into the lungs 2 (two) times daily.    Marland Kitchen albuterol (PROAIR HFA) 108 (90 Base) MCG/ACT inhaler 2 inhalations 10-15 minutes apart evert 4 hours as needed for Asthma Flare (Patient taking differently: Inhale 2 puffs into the lungs every 4 (four) hours as needed for shortness of breath (asthma flare). ) 48 g 3  . allopurinol (ZYLOPRIM) 300 MG tablet Take 300 mg by mouth daily.    Marland Kitchen aspirin EC 81 MG tablet Take 81 mg by mouth daily.    . Cholecalciferol (VITAMIN D3) 5000 units CAPS Take 5,000 Units by mouth daily.     . digoxin (LANOXIN) 0.125 MG tablet Take 0.125 mg by mouth daily.    Marland Kitchen gabapentin (NEURONTIN) 600 MG tablet Take 0.5 tablets (300 mg total) by mouth 2 (two) times daily.    Marland Kitchen guaiFENesin (MUCINEX PO) Take 1 tablet by mouth daily as needed (runny nose).    Marland Kitchen ipratropium  (ATROVENT) 0.06 % nasal spray Use 1 to 2 sprays each Nostril 2 to 3 x / day as needed (Patient taking differently: Place 1-2 sprays into both nostrils 3 (three) times daily as needed for rhinitis (congestion). ) 45 mL 3  . ipratropium-albuterol (DUONEB) 0.5-2.5 (3) MG/3ML SOLN Take 3 mLs by nebulization every 4 (four) hours as needed (shortness of breath).    . isosorbide mononitrate (IMDUR) 30 MG 24 hr tablet Take 1 tablet daily for BP & Heart (Patient taking differently: Take 30 mg by mouth daily. for BP & Heart) 90 tablet 1  . levothyroxine (SYNTHROID, LEVOTHROID) 50 MCG tablet Take 0.5 tablets (25 mcg total) by mouth daily before breakfast. 90 tablet 1  . Melatonin 3 MG TABS Take 1 tablet (3 mg total) by mouth at bedtime as needed (insomnia).  0  . metoprolol succinate (TOPROL-XL) 25 MG 24 hr tablet Take 1 tablet (25 mg total) by mouth daily. 30 tablet 0  . montelukast (SINGULAIR) 10 MG tablet Take 10 mg by mouth at bedtime.    . Olopatadine HCl 0.2 % SOLN Place 1 drop into both eyes daily.     . pantoprazole (PROTONIX) 40 MG tablet Take 1 tablet (40 mg total) by mouth daily.    [name] Glycol-Propyl Glycol (SYSTANE OP) Place 1 drop into both eyes 4 (four) times  daily as needed (dry eyes).     . potassium chloride SA (KLOR-CON M20) 20 MEQ tablet TAKE 2 TABLETS (40 MEQ TOTAL) BY MOUTH DAILY. (Patient taking differently: Take 40 mEq by mouth daily. ) 180 tablet 1  . pravastatin (PRAVACHOL) 40 MG tablet Take 40 mg by mouth daily.    . prednisoLONE acetate (PRED FORTE) 1 % ophthalmic suspension Place 1 drop into both eyes 2 (two) times daily.   1  . prochlorperazine (COMPAZINE) 5 MG tablet TAKE 1 TABLET BY MOUTH THREE TIMES A DAY AS NEEDED FOR VERTIGO OR NAUSEA (Patient taking differently: Take 5 mg by mouth daily as needed for nausea (vertigo). ) 60 tablet 1  . torsemide (DEMADEX) 20 MG tablet Take 4 tablets (80 mg total) by mouth daily. 120 tablet 0  . triamcinolone cream (KENALOG) 0.1 % Apply 1  application topically daily as needed (foot itching).     . warfarin (COUMADIN) 2 MG tablet Take 2-3 mg by mouth See admin instructions. Take 1 tablet (2 mg) by mouth on Thursday, Friday, Saturday morning, take 1 1/2 tablets (3 mg) on Sunday, Monday, Tuesday, Wednesday morning.     No current facility-administered medications for this visit.      Past Medical History:  Diagnosis Date  . AAA (abdominal aortic aneurysm) ([facility])   . Atrial fibrillation ([place])   . CHF (congestive heart failure) ([place])   . COPD (chronic obstructive pulmonary disease) ([place])   . Depression   . GERD (gastroesophageal reflux disease)   . Hyperlipidemia   . Hypertension   . Hypothyroid   . Migraine headache   . Osteopenia   . PVD (peripheral vascular disease) ([place])     ROS:   All systems reviewed and negative except as noted in the HPI.   Past Surgical History:  Procedure Laterality Date  . ABDOMINAL AORTIC ENDOVASCULAR STENT GRAFT N/A 04/11/2013   Procedure: ABDOMINAL AORTIC ENDOVASCULAR STENT GRAFT WITH RIGHT FEMORAL PATCH ANGIOPLASTY;  Surgeon: Mal Misty, MD;  Location: [facility];  Service: Vascular;  Laterality: N/A;  . BREAST SURGERY     LEFT BREAST BIOPSY  . broken leg Left 1970s  . CARDIAC CATHETERIZATION    . CHOLECYSTECTOMY  1972  . COLONOSCOPY    . EYE SURGERY Bilateral   . Laser vein procedure    . REFRACTIVE SURGERY    . TONSILLECTOMY       Family History  Problem Relation Age of Onset  . Cirrhosis Mother   . Cancer Mother 61       PANCREAS  . Heart defect Sister   . Breast cancer Sister        age 31  . Heart disease Sister   . Stroke Sister   . Alcohol abuse Father   . Depression Father   . Hypertension Brother   . Hyperlipidemia Son   . Heart disease Daughter      Social History   Socioeconomic History  . Marital status: Married    Spouse name: Not on file  . Number of children: Not on file  . Years of education: Not on file  . Highest education level: Not on  file  Occupational History  . Not on file  Social Needs  . Financial resource strain: Somewhat hard  . Food insecurity:    Worry: Never true    Inability: Never true  . Transportation needs:    Medical: No    Non-medical: No  Tobacco Use  . Smoking status:  Former Smoker    Types: Cigarettes    Last attempt to quit: 08/10/2002    Years since quitting: 15.2  . Smokeless tobacco: Never Used  . Tobacco comment: History of tobacco abuse  Substance and Sexual Activity  . Alcohol use: Yes    Alcohol/week: 0.0 standard drinks    Comment: rare  . Drug use: No  . Sexual activity: Yes    Birth control/protection: Post-menopausal  Lifestyle  . Physical activity:    Days per week: 3 days    Minutes per session: 40 min  . Stress: Not at all  Relationships  . Social connections:    Talks on phone: More than three times a week    Gets together: Twice a week    Attends religious service: More than 4 times per year    Active member of club or organization: Yes    Attends meetings of clubs or organizations: Never    Relationship status: Married  . Intimate partner violence:    Fear of current or ex partner: No    Emotionally abused: No    Physically abused: No    Forced sexual activity: No  Other Topics Concern  . Not on file  Social History Narrative   Lives in [place] with husband   One daughter     BP 130/76   Pulse 64   Ht 5\' 3"  (1.6 m)   Wt 202 lb 9.6 oz (91.9 kg)   SpO2 97%   BMI 35.89 kg/m   Physical Exam:  Well appearing 77 yo woman, NAD HEENT: Unremarkable Neck:  6 cm JVD, no thyromegally Lymphatics:  No adenopathy Back:  No CVA tenderness Lungs:  Clear with no wheezes HEART:  Regular rate rhythm, no murmurs, no rubs, no clicks Abd:  soft, positive bowel sounds, no organomegally, no rebound, no guarding Ext:  2 plus pulses, no edema, no cyanosis, no clubbing Skin:  No rashes no nodules Neuro:  CN II through XII intact, motor grossly intact  EKG - NSR  with LBBB   Assess/Plan: 1. Chronic systolic heart failure - she has had progression of her disease. She has been unable to take an ACE inhibitors in the past due to renal dysfunction and had a cough on beta blockers. Her symptoms are class 3. I have discussed the indications/risks/benefits/goals/expectations of BiV ICD insertion. We discussed BIV PPM vs ICD. She would like the ICD.  2. Atrial fib - she is chronically in atrial fib. She will continue rate control. I might try and rechallenge her with toprol after her device is placed.   I spent over 25 minutes including over 50% face-face time with this patient producing her note.  Mikle Bosworth.D.

## 2017-11-04 NOTE — Patient Instructions (Addendum)
Medication Instructions:  Your physician recommends that you continue on your current medications as directed. Please refer to the Current Medication list given to you today.  Labwork: None ordered.  Testing/Procedures: Your physician has recommended that you have a defibrillator inserted. An implantable cardioverter defibrillator (ICD) is a small device that is placed in your chest or, in rare cases, your abdomen. This device uses electrical pulses or shocks to help control life-threatening, irregular heartbeats that could lead the heart to suddenly stop beating (sudden cardiac arrest). Leads are attached to the ICD that goes into your heart. This is done in the hospital and usually requires an overnight stay. Please see the instruction sheet given to you today for more information.  Follow-Up: You will follow up with device clinic 10-14 days after your procedure for a wound check.  You will follow up with Dr. Lovena Le 91 days after your procedure.  Any Other Special Instructions Will Be Listed Below (If Applicable).  Please arrive at the [facility] main entrance of [facility] hospital at:  9:30 am on November 10, 2017  Use CHG surgical scrub as directed  Do not eat or drink after midnight prior to procedure  You may take your morning medications EXCEPT TORSEMIDE.   Do NOT take your WARFARIN for 2 days prior to your procedure-last dose will be November 07, 2017  Plan for one night stay  You will need someone to drive you home at discharge  If you need a refill on your cardiac medications before your next appointment, please call your pharmacy.   Biventricular Pacemaker Implantation A biventricular pacemaker implantation is a procedure to place (implant) a pacemaker into both of the lower chambers (ventricles) of the heart. A pacemaker is a small, battery-powered device that helps control the heartbeat. If the heart beats irregularly or too slowly (bradycardia), the pacemaker will pace  the heart so that it beats at a normal rate or a programmed rate. The parts of a biventricular pacemaker include:  The pulse generator. The pulse generator contains a small computer and a memory system that is programmed to keep the heart beating at a certain rate. The pulse generator also produces the electrical signal that triggers the heart to beat. This is implanted under the skin of the upper chest, near the collarbone.  Wires (leads). The leads are placed in the left and right ventricles of the heart. The leads are connected to the pulse generator. They transmit electrical pulses from the pulse generator to the heart.  This procedure may be done to treat:  Bradycardia.  Symptoms of severe heart failure, such as shortness of breath (dyspnea).  Loss of consciousness that happens repeatedly (syncope) because of an irregular heart rate.  Tell a health care provider about:  Any allergies you have.  All medicines you are taking, including vitamins, herbs, eye drops, creams, and over-the-counter medicines.  Any problems you or family members have had with anesthetic medicines.  Any blood disorders you have.  Any surgeries you have had.  Any medical conditions you have.  Whether you are pregnant or may be pregnant. What are the risks? Generally, this is a safe procedure. However, problems may occur, including:  Infection.  Bleeding.  Allergic reactions to medicines or dyes.  Damage to other structures or organs, such as your blood vessels, lungs, or heart.  Failure of the pacemaker to improve your condition.  What happens before the procedure?  Ask your health care provider about: ? Changing or stopping  your regular medicines. This is especially important if you are taking diabetes medicines or blood thinners. ? Taking medicines such as aspirin and ibuprofen. These medicines can thin your blood. Do not take these medicines before your procedure if your health care provider  instructs you not to.  Follow instructions from your health care provider about eating or drinking restrictions.  Do not use any tobacco products for at least 24 hours before your procedure. This includes cigarettes, chewing tobacco, or e-cigarettes.  Ask your health care provider how your surgical site will be marked or identified.  You may be given antibiotic medicine to help prevent infection.  You may have tests, including: ? Blood tests. ? Chest X-rays.  Plan to have someone take you home after the procedure.  If you go home right after the procedure, plan to have someone with you for 24 hours. What happens during the procedure?  To reduce your risk of infection: ? Your health care team will wash or sanitize their hands. ? Your skin will be washed with soap. ? Hair may be removed from your surgical area.  An IV tube will be inserted into one of your veins.  You will be given one or more of the following: ? A medicine to help you relax (sedative). ? A medicine to make you fall asleep (general anesthetic). ? A medicine that is injected into your spine to numb the area below and slightly above the injection site (spinal anesthetic). ? A medicine that is injected into an area of your body to numb everything below the injection site (regional anesthetic).  An incision will be made in your upper chest, near your heart.  The leads will be guided into your incision, through your blood vessels, and into your ventricles. Your surgeon will use an X-ray machine (fluoroscope) to guide the leads into your heart.  The leads will be attached to your heart muscles and to the pulse generator.  The leads will be tested to make sure that they work correctly.  The pulse generator will be implanted under your skin, near your incision.  Your incision will be closed with stitches (sutures), skin glue, or adhesive tape.  A bandage (dressing) will be placed over your incision. The procedure  may vary among health care providers and hospitals. What happens after the procedure?  Your blood pressure, heart rate, breathing rate, and blood oxygen level will be monitored often until the medicines you were given have worn off.  You may continue to receive fluids and medicines through an IV tube.  You will have some pain. Pain medicines will be available to help you.  You will have a chest X-ray done. This is to make sure that your pacemaker is in the right place.  You may have to wear compression stockings. These stockings help to prevent blood clots and reduce swelling in your legs.  You will be given a pacemaker identification card. This card lists the implant date, device model, and manufacturer of your pacemaker.  Do not drive for 24 hours if you received a sedative. This information is not intended to replace advice given to you by your health care provider. Make sure you discuss any questions you have with your health care provider. Document Released: 11/24/2011 Document Revised: 08/07/2015 Document Reviewed: 11/24/2014 Elsevier Interactive Patient Education  [employer].

## 2017-11-07 ENCOUNTER — Telehealth: Payer: Self-pay | Admitting: *Deleted

## 2017-11-07 ENCOUNTER — Ambulatory Visit: Payer: Medicare Other | Admitting: Internal Medicine

## 2017-11-07 ENCOUNTER — Ambulatory Visit (INDEPENDENT_AMBULATORY_CARE_PROVIDER_SITE_OTHER): Payer: Medicare Other | Admitting: Orthopaedic Surgery

## 2017-11-07 NOTE — Telephone Encounter (Signed)
Called patient on 11/07/2017 , 4:53 PM in an attempt to reach the patient for a hospital follow up.   Admit date: 10/30/17 Discharge: 11/02/17   She DOES BUT WILL DISCUSS WITH DR MCK TOMORROW have any questions or concerns about medications from the hospital admission. The patient's medications were reviewed over the phone, they were counseled to bring in all current medications to the hospital follow up visit.   I advised the patient to call if any questions or concerns arise about the hospital admission or medications    Home health WAS NOT  started in the hospital.  All questions were answered and a follow up appointment was made.   Prior to Admission medications   Medication Sig Start Date End Date Taking? Authorizing Provider  acetaminophen (TYLENOL) 500 MG tablet Take 1,000 mg by mouth every 6 (six) hours as needed for moderate pain.     [provider]  Aclidinium Bromide (TUDORZA PRESSAIR) 400 MCG/ACT AEPB Inhale 1 puff into the lungs 2 (two) times daily.    [provider]  albuterol (PROAIR HFA) 108 (90 Base) MCG/ACT inhaler 2 inhalations 10-15 minutes apart evert 4 hours as needed for Asthma Flare Patient taking differently: Inhale 2 puffs into the lungs every 4 (four) hours as needed for shortness of breath (asthma flare).  07/20/17   [name], MD  allopurinol (ZYLOPRIM) 300 MG tablet Take 300 mg by mouth daily.    [provider]  aspirin EC 81 MG tablet Take 81 mg by mouth daily.    [provider]  Cholecalciferol (VITAMIN D3) 5000 units CAPS Take 5,000 Units by mouth daily.     [provider]  digoxin (LANOXIN) 0.125 MG tablet Take 0.125 mg by mouth daily.    [provider]  gabapentin (NEURONTIN) 600 MG tablet Take 0.5 tablets (300 mg total) by mouth 2 (two) times daily. 11/02/17   Geradine Girt, DO  guaiFENesin (MUCINEX PO) Take 1 tablet by mouth daily as needed (runny nose).    [provider]  ipratropium  (ATROVENT) 0.06 % nasal spray Use 1 to 2 sprays each Nostril 2 to 3 x / day as needed Patient taking differently: Place 1-2 sprays into both nostrils 3 (three) times daily as needed for rhinitis (congestion).  07/05/17 07/06/18  [name], MD  ipratropium-albuterol (DUONEB) 0.5-2.5 (3) MG/3ML SOLN Take 3 mLs by nebulization every 4 (four) hours as needed (shortness of breath).    [provider]  isosorbide mononitrate (IMDUR) 30 MG 24 hr tablet Take 1 tablet daily for BP & Heart Patient taking differently: Take 30 mg by mouth daily.  10/21/17   [name], MD  levothyroxine (SYNTHROID, LEVOTHROID) 50 MCG tablet Take 0.5 tablets (25 mcg total) by mouth daily before breakfast. 10/18/17   Liane Comber, NP  Melatonin 3 MG TABS Take 1 tablet (3 mg total) by mouth at bedtime as needed (insomnia). 11/02/17   Geradine Girt, DO  metoprolol succinate (TOPROL-XL) 25 MG 24 hr tablet Take 1 tablet (25 mg total) by mouth daily. 11/03/17   Geradine Girt, DO  montelukast (SINGULAIR) 10 MG tablet Take 10 mg by mouth at bedtime.    [provider]  Olopatadine HCl 0.2 % SOLN Place 1 drop into both eyes daily.     [provider]  pantoprazole (PROTONIX) 40 MG tablet Take 1 tablet (40 mg total) by mouth daily. 07/16/16   Ghimire, Henreitta Leber, MD  Polyethyl Glycol-Propyl Glycol (SYSTANE  OP) Place 1 drop into both eyes 4 (four) times daily as needed (dry eyes).     [provider]  potassium chloride SA (KLOR-CON M20) 20 MEQ tablet TAKE 2 TABLETS (40 MEQ TOTAL) BY MOUTH DAILY. Patient taking differently: Take 40 mEq by mouth daily.  04/11/17   Vicie Mutters, PA-C  pravastatin (PRAVACHOL) 40 MG tablet Take 40 mg by mouth daily.    [provider]  prednisoLONE acetate (PRED FORTE) 1 % ophthalmic suspension Place 1 drop into both eyes 2 (two) times daily.  04/27/17   [provider]  prochlorperazine (COMPAZINE) 5 MG tablet TAKE 1 TABLET BY MOUTH THREE TIMES A  DAY AS NEEDED FOR VERTIGO OR NAUSEA Patient taking differently: Take 5 mg by mouth daily as needed for nausea (vertigo).  10/09/17   [name], MD  torsemide (DEMADEX) 20 MG tablet Take 4 tablets (80 mg total) by mouth daily. 11/03/17   Geradine Girt, DO  triamcinolone cream (KENALOG) 0.1 % Apply 1 application topically daily.     [provider]  warfarin (COUMADIN) 2 MG tablet Take 2-3 mg by mouth See admin instructions. Take 2 mg by mouth on Thursday, Friday, Saturday morning, take (3 mg) on Sunday, Monday, Tuesday, Wednesday morning.    [provider]

## 2017-11-08 ENCOUNTER — Ambulatory Visit (INDEPENDENT_AMBULATORY_CARE_PROVIDER_SITE_OTHER): Payer: Medicare Other | Admitting: Internal Medicine

## 2017-11-08 ENCOUNTER — Encounter: Payer: Self-pay | Admitting: Internal Medicine

## 2017-11-08 VITALS — BP 138/74 | HR 52 | Temp 97.0°F | Resp 16 | Ht 63.0 in | Wt 195.6 lb

## 2017-11-08 DIAGNOSIS — J9601 Acute respiratory failure with hypoxia: Secondary | ICD-10-CM | POA: Diagnosis not present

## 2017-11-08 DIAGNOSIS — N183 Chronic kidney disease, stage 3 unspecified: Secondary | ICD-10-CM

## 2017-11-08 DIAGNOSIS — E039 Hypothyroidism, unspecified: Secondary | ICD-10-CM | POA: Diagnosis not present

## 2017-11-08 DIAGNOSIS — I10 Essential (primary) hypertension: Secondary | ICD-10-CM

## 2017-11-08 DIAGNOSIS — I5043 Acute on chronic combined systolic (congestive) and diastolic (congestive) heart failure: Secondary | ICD-10-CM

## 2017-11-08 DIAGNOSIS — Z7901 Long term (current) use of anticoagulants: Secondary | ICD-10-CM

## 2017-11-08 DIAGNOSIS — I482 Chronic atrial fibrillation, unspecified: Secondary | ICD-10-CM

## 2017-11-08 DIAGNOSIS — I2721 Secondary pulmonary arterial hypertension: Secondary | ICD-10-CM

## 2017-11-08 DIAGNOSIS — Z79899 Other long term (current) drug therapy: Secondary | ICD-10-CM

## 2017-11-08 DIAGNOSIS — I5042 Chronic combined systolic (congestive) and diastolic (congestive) heart failure: Secondary | ICD-10-CM | POA: Diagnosis not present

## 2017-11-08 DIAGNOSIS — J8410 Pulmonary fibrosis, unspecified: Secondary | ICD-10-CM

## 2017-11-08 NOTE — Progress Notes (Signed)
Katie [name]     This very nice 77 y.o.  MWF was admitted to the hospital on 10/30/2017 and patient was discharged from the hospital on 11/02/2017. The patient now presents for follow up for transition from recent hospitalization.  The day after discharge  our clinical staff contacted the patient to assure stability and schedule a follow up appointment. The discharge summary, medications and diagnostic test results were reviewed before meeting with the patient. 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 Patient was diuresed and improved. Post diuresis her renal status dropped to CKD4 (GFR 25). Post hospital,  a BIV ICD is  scheduled by Dr Lovena Le for 11/10/2017.      Hospitalization discharge instructions and medications are reconciled with the patient.      Patient is also followed with Hypertension, Hyperlipidemia, Pre-Diabetes and Vitamin D Deficiency. Patient has Amiodarone induced Pulm Fibrosis.      Patient is treated for HTN (1999) w/CKD3 - now dropped to CKD4 since hospitalization.  BP has been controlled at home. Today's BP is at goal - 138/74. Patient has had no complaints of any cardiac type chest pain, palpitations, orthopnea/PND, dizziness, claudication, or dependent edema, but does continue to c/o dyspnea. Room air sat's of 92-93% quickly drop with ambulation to the mid - 80's.      Hyperlipidemia is not  controlled with diet & meds. Patient denies myalgias or other med SE's. Last Lipids were not at goal: Lab Results  Component Value  Date   CHOL 172 09/29/2017   HDL 43 (L) 09/29/2017   LDLCALC 103 (H) 09/29/2017   TRIG 159 (H) 09/29/2017   CHOLHDL 4.0 09/29/2017      Also, the patient has history of  Morbid Obesity (BMI 36+) / PreDiabetes (A1c-5.7%/2013)  and has had no symptoms of reactive hypoglycemia, diabetic polys, paresthesias or visual blurring.  Last A1c was not at goal: Lab Results  Component Value Date   HGBA1C 6.0 (H) 09/29/2017      Patient has been on thyroid replacement since 2012.         Further, the patient also has history of Vitamin D Deficiency ("33"/2008)  and supplements vitamin D without any suspected side-effects. Last vitamin D was at goal: Lab Results  Component Value Date   VD25OH 71 09/29/2017   Current Outpatient Medications on File Prior to Visit  Medication Sig  . acetaminophen (TYLENOL) 500 MG tablet Take 1,000 mg by mouth every 6 (six) hours as needed for moderate pain.   . Aclidinium Bromide (TUDORZA PRESSAIR) 400 MCG/ACT AEPB Inhale 1 puff into the lungs 2 (two) times daily.  Marland Kitchen albuterol (PROAIR HFA) 108 (90 Base) MCG/ACT inhaler 2 inhalations 10-15 minutes apart evert 4 hours as needed for Asthma Flare (Patient taking differently: Inhale 2 puffs into the lungs every 4 (four) hours as needed for shortness of breath (asthma flare). )  . allopurinol (ZYLOPRIM) 300 MG tablet Take 300 mg by mouth daily.  Marland Kitchen aspirin EC 81 MG tablet Take 81 mg by mouth daily.  Marland Kitchen  Cholecalciferol (VITAMIN D3) 5000 units CAPS Take 5,000 Units by mouth daily.   . digoxin (LANOXIN) 0.125 MG tablet Take 0.125 mg by mouth daily.  Marland Kitchen gabapentin (NEURONTIN) 600 MG tablet Take 0.5 tablets (300 mg total) by mouth 2 (two) times daily.  Marland Kitchen guaiFENesin (MUCINEX PO) Take 1 tablet by mouth daily as needed (runny nose).  Marland Kitchen ipratropium (ATROVENT) 0.06 % nasal spray Use 1 to 2 sprays each Nostril 2 to 3 x / day as needed (Patient taking differently: Place 1-2 sprays into both nostrils 3 (three) times daily as needed for  rhinitis (congestion). )  . ipratropium-albuterol (DUONEB) 0.5-2.5 (3) MG/3ML SOLN Take 3 mLs by nebulization every 4 (four) hours as needed (shortness of breath).  . isosorbide mononitrate (IMDUR) 30 MG 24 hr tablet Take 1 tablet daily for BP & Heart (Patient taking differently: Take 30 mg by mouth daily. )  . levothyroxine (SYNTHROID, LEVOTHROID) 50 MCG tablet Take 0.5 tablets (25 mcg total) by mouth daily before breakfast.  . Melatonin 3 MG TABS Take 1 tablet (3 mg total) by mouth at bedtime as needed (insomnia).  . metoprolol succinate (TOPROL-XL) 25 MG 24 hr tablet Take 1 tablet (25 mg total) by mouth daily.  . montelukast (SINGULAIR) 10 MG tablet Take 10 mg by mouth at bedtime.  . Olopatadine HCl 0.2 % SOLN Place 1 drop into both eyes daily.   . pantoprazole (PROTONIX) 40 MG tablet Take 1 tablet (40 mg total) by mouth daily.  [name] Glycol-Propyl Glycol (SYSTANE OP) Place 1 drop into both eyes 4 (four) times daily as needed (dry eyes).   . potassium chloride SA (KLOR-CON M20) 20 MEQ tablet TAKE 2 TABLETS (40 MEQ TOTAL) BY MOUTH DAILY. (Patient taking differently: Take 40 mEq by mouth daily. )  . pravastatin (PRAVACHOL) 40 MG tablet Take 40 mg by mouth daily.  . prednisoLONE acetate (PRED FORTE) 1 % ophthalmic suspension Place 1 drop into both eyes 2 (two) times daily.   . prochlorperazine (COMPAZINE) 5 MG tablet TAKE 1 TABLET BY MOUTH THREE TIMES A DAY AS NEEDED FOR VERTIGO OR NAUSEA (Patient taking differently: Take 5 mg by mouth daily as needed for nausea (vertigo). )  . torsemide (DEMADEX) 20 MG tablet Take 4 tablets (80 mg total) by mouth daily.  Marland Kitchen triamcinolone cream (KENALOG) 0.1 % Apply 1 application topically daily.   Marland Kitchen warfarin (COUMADIN) 2 MG tablet Take 2-3 mg by mouth See admin instructions. Take 2 mg by mouth on Thursday, Friday, Saturday morning, take (3 mg) on Sunday, Monday, Tuesday, Wednesday morning.   No current facility-administered medications on file prior to visit.     Allergies  Allergen Reactions  . Amiodarone Other (See Comments)    PULMONARY TOXICITY  . Diovan [Valsartan] Other (See Comments)    HYPOTENSION  . Doxycycline Diarrhea and Other (See Comments)    VISUAL DISTURBANCE  . Flexeril [Cyclobenzaprine] Other (See Comments)    FATIGUE  . Keflex [Cephalexin] Diarrhea  . Verapamil Other (See Comments)    EDEMA  . Codeine Hives   PMHx:   Past Medical History:  Diagnosis Date  . AAA (abdominal aortic aneurysm) ([place])   . Atrial fibrillation ([place])   . CHF (congestive heart failure) ([place])   . COPD (chronic obstructive pulmonary disease) ([place])   . Depression   . GERD (gastroesophageal reflux disease)   . Hyperlipidemia   . Hypertension   . Hypothyroid   . Migraine headache   . Osteopenia   .  PVD (peripheral vascular disease) ([place])    Immunization History  Administered Date(s) Administered  . Influenza, High Dose Seasonal PF 12/20/2013, 12/09/2014, 11/14/2015, 01/10/2017  . Influenza-Unspecified 01/02/2013  . PPD Test 07/17/2016  . Pneumococcal Conjugate-13 01/23/2014  . Pneumococcal-Unspecified 03/15/1993, 05/31/2008  . Td 03/16/2007  . Tdap 08/11/2017  . Varicella 02/19/2008   Past Surgical History:  Procedure Laterality Date  . ABDOMINAL AORTIC ENDOVASCULAR STENT GRAFT N/A 04/11/2013   Procedure: ABDOMINAL AORTIC ENDOVASCULAR STENT GRAFT WITH RIGHT FEMORAL PATCH ANGIOPLASTY;  Surgeon: Mal Misty, MD;  Location: [facility];  Service: Vascular;  Laterality: N/A;  . BREAST SURGERY     LEFT BREAST BIOPSY  . broken leg Left 1970s  . CARDIAC CATHETERIZATION    . CHOLECYSTECTOMY  1972  . COLONOSCOPY    . EYE SURGERY Bilateral   . Laser vein procedure    . REFRACTIVE SURGERY    . TONSILLECTOMY     FHx:    Reviewed / unchanged  SHx:    Reviewed / unchanged  Systems Review:  Constitutional: Denies fever, chills, wt changes, headaches, insomnia, fatigue, night sweats, change in appetite. Eyes: Denies redness, blurred vision,  diplopia, discharge, itchy, watery eyes.  ENT: Denies discharge, congestion, post nasal drip, epistaxis, sore throat, earache, hearing loss, dental pain, tinnitus, vertigo, sinus pain, snoring.  CV: Denies chest pain, palpitations, irregular heartbeat, syncope, dyspnea, diaphoresis, orthopnea, PND, claudication or edema. Respiratory: denies cough, pleurisy, hoarseness, laryngitis, wheezing. Has DOE as above ambulating at a fast pace. Gastrointestinal: Denies dysphagia, odynophagia, heartburn, reflux, water brash, abdominal pain or cramps, nausea, vomiting, bloating, diarrhea, constipation, hematemesis, melena, hematochezia  or hemorrhoids. Genitourinary: Denies dysuria, frequency, urgency, nocturia, hesitancy, discharge, hematuria or flank pain. Musculoskeletal: Denies arthralgias, myalgias, stiffness, jt. swelling, pain, limping or strain/sprain.  Skin: Denies pruritus, rash, hives, warts, acne, eczema or change in skin lesion(s). Neuro: No weakness, tremor, incoordination, spasms, paresthesia or pain. Psychiatric: Denies confusion, memory loss or sensory loss. Endo: Denies change in weight, skin or hair change.  Heme/Lymph: No excessive bleeding, bruising or enlarged lymph nodes.  Physical Exam  BP 138/74   Pulse (!) 52   Temp (!) 97 F (36.1 C)   Resp 16   Ht 5\' 3"  (1.6 m)   Wt 195 lb 9.6 oz (88.7 kg)   BMI 34.65 kg/m   Appears well nourished, well groomed  and in no distress.  Eyes: PERRLA, EOMs, conjunctiva no swelling or erythema. Sinuses: No frontal/maxillary tenderness ENT/Mouth: EAC's clear, TM's nl w/o erythema, bulging. Nares clear w/o erythema, swelling, exudates. Oropharynx clear without erythema or exudates. Oral hygiene is good. Tongue normal, non obstructing. Hearing intact.  Neck: Supple. Thyroid nl. Car 2+/2+ without bruits, nodes or JVD. Chest: Respirations nl with BS clear & equal w/o rales, rhonchi, wheezing or stridor.  Cor: Heart sounds normal w/ regular rate  and rhythm without sig. murmurs, gallops, clicks or rubs. Peripheral pulses normal and equal  without edema.  Abdomen: Soft & bowel sounds normal. Non-tender w/o guarding, rebound, hernias, masses or organomegaly.  Lymphatics: Unremarkable.  Musculoskeletal: Full ROM all peripheral extremities, joint stability, 5/5 strength and normal gait.  Skin: Warm, dry without exposed rashes, lesions or ecchymosis apparent.  Neuro: Cranial nerves intact, reflexes equal bilaterally. Sensory-motor testing grossly intact. Tendon reflexes grossly intact.  Pysch: Alert & oriented x 3.  Insight and judgement nl & appropriate. No ideations.  Assessment and Plan:  1. Acute respiratory failure with hypoxia ([place])  2. Acute on chronic combined systolic (  congestive) and diastolic (congestive) heart failure ([facility])  - COMPLETE METABOLIC PANEL WITH GFR - Brain natriuretic peptide  3. Chronic combined systolic and diastolic congestive heart failure ([facility])  - COMPLETE METABOLIC PANEL WITH GFR - Brain natriuretic peptide  4. Essential hypertension  - Continue medication, monitor blood pressure at home.  - Continue DASH diet. Reminder to go to the ER if any CP,  SOB, nausea, dizziness, severe HA, changes vision/speech.  - COMPLETE METABOLIC PANEL WITH GFR  5. Chronic atrial fibrillation (H[facility])  - Brain natriuretic peptide - Protime-INR  6. Hypothyroidism  - TSH  7. CKD  stage III (GFR 51 ml/min)  - COMPLETE METABOLIC PANEL WITH GFR  8. Pulmonary Fibrosis sequellae of Amiodarone  9. PAH (pulmonary artery hypertension) ([place])  10. Long term current use of anticoagulant therapy  - Stopping coumadin for 2 days prior to ACID implant in 3 days. - Protime-INR  11. Medication management  - CBC with Differential/Platelet - COMPLETE METABOLIC PANEL WITH GFR - Brain natriuretic peptide - TSH      Discussed  regular exercise, BP monitoring, weight control to achieve/maintain BMI less than 25 and  discussed meds and SE's. Recommended labs to assess and monitor clinical status with further disposition pending results of labs. Over 30 minutes of exam, counseling, chart review was performed.

## 2017-11-08 NOTE — Patient Instructions (Signed)

## 2017-11-09 LAB — COMPLETE METABOLIC PANEL WITH GFR
AG RATIO: 2 (calc) (ref 1.0–2.5)
ALBUMIN MSPROF: 4.3 g/dL (ref 3.6–5.1)
ALKALINE PHOSPHATASE (APISO): 38 U/L (ref 33–130)
ALT: 16 U/L (ref 6–29)
AST: 21 U/L (ref 10–35)
BILIRUBIN TOTAL: 0.7 mg/dL (ref 0.2–1.2)
BUN / CREAT RATIO: 17 (calc) (ref 6–22)
BUN: 27 mg/dL — AB (ref 7–25)
CHLORIDE: 100 mmol/L (ref 98–110)
CO2: 25 mmol/L (ref 20–32)
Calcium: 9.9 mg/dL (ref 8.6–10.4)
Creat: 1.6 mg/dL — ABNORMAL HIGH (ref 0.60–0.93)
GFR, EST AFRICAN AMERICAN: 36 mL/min/{1.73_m2} — AB (ref >=60)
GFR, Est Non African American: 31 mL/min/{1.73_m2} — ABNORMAL LOW (ref >=60)
GLUCOSE: 86 mg/dL (ref 65–99)
Globulin: 2.2 g/dL (calc) (ref 1.9–3.7)
POTASSIUM: 4.6 mmol/L (ref 3.5–5.3)
Sodium: 138 mmol/L (ref 135–146)
TOTAL PROTEIN: 6.5 g/dL (ref 6.1–8.1)

## 2017-11-09 LAB — CBC WITH DIFFERENTIAL/PLATELET
BASOS ABS: 61 {cells}/uL (ref 0–200)
Basophils Relative: 0.7 %
EOS ABS: 87 {cells}/uL (ref 15–500)
Eosinophils Relative: 1 %
HCT: 42.2 % (ref 35.0–45.0)
Hemoglobin: 14.1 g/dL (ref 11.7–15.5)
Lymphs Abs: 1679 cells/uL (ref 850–3900)
MCH: 29 pg (ref 27.0–33.0)
MCHC: 33.4 g/dL (ref 32.0–36.0)
MCV: 86.7 fL (ref 80.0–100.0)
MONOS PCT: 10.8 %
MPV: 11.1 fL (ref 7.5–12.5)
Neutro Abs: 5933 cells/uL (ref 1500–7800)
Neutrophils Relative %: 68.2 %
PLATELETS: 282 10*3/uL (ref 140–400)
RBC: 4.87 10*6/uL (ref 3.80–5.10)
RDW: 14.8 % (ref 11.0–15.0)
TOTAL LYMPHOCYTE: 19.3 %
WBC: 8.7 10*3/uL (ref 3.8–10.8)
WBCMIX: 940 {cells}/uL (ref 200–950)

## 2017-11-09 LAB — TSH: TSH: 10.86 m[IU]/L — AB (ref 0.40–4.50)

## 2017-11-09 LAB — PROTIME-INR
INR: 4 — AB
PROTHROMBIN TIME: 41.4 s — AB (ref 9.0–11.5)

## 2017-11-09 LAB — BRAIN NATRIURETIC PEPTIDE: Brain Natriuretic Peptide: 137 pg/mL — ABNORMAL HIGH (ref <100)

## 2017-11-10 ENCOUNTER — Encounter (HOSPITAL_COMMUNITY): Payer: Self-pay | Admitting: General Practice

## 2017-11-10 ENCOUNTER — Ambulatory Visit (HOSPITAL_COMMUNITY)
Admission: RE | Disposition: A | Discharge status: Home / Self Care | Payer: Self-pay | Source: Ambulatory Visit | Attending: Internal Medicine

## 2017-11-10 ENCOUNTER — Other Ambulatory Visit: Payer: Self-pay

## 2017-11-10 ENCOUNTER — Ambulatory Visit (HOSPITAL_COMMUNITY)
Admission: RE | Admit: 2017-11-10 | Discharge: 2017-11-11 | Disposition: A | Discharge status: Home / Self Care | Payer: Medicare Other | Source: Ambulatory Visit | Attending: Internal Medicine | Admitting: Internal Medicine

## 2017-11-10 DIAGNOSIS — I429 Cardiomyopathy, unspecified: Secondary | ICD-10-CM | POA: Insufficient documentation

## 2017-11-10 DIAGNOSIS — Z006 Encounter for examination for normal comparison and control in clinical research program: Secondary | ICD-10-CM | POA: Insufficient documentation

## 2017-11-10 DIAGNOSIS — Z95810 Presence of automatic (implantable) cardiac defibrillator: Secondary | ICD-10-CM

## 2017-11-10 DIAGNOSIS — Z823 Family history of stroke: Secondary | ICD-10-CM | POA: Insufficient documentation

## 2017-11-10 DIAGNOSIS — Z9049 Acquired absence of other specified parts of digestive tract: Secondary | ICD-10-CM | POA: Insufficient documentation

## 2017-11-10 DIAGNOSIS — Z9889 Other specified postprocedural states: Secondary | ICD-10-CM | POA: Insufficient documentation

## 2017-11-10 DIAGNOSIS — Z885 Allergy status to narcotic agent status: Secondary | ICD-10-CM | POA: Insufficient documentation

## 2017-11-10 DIAGNOSIS — I5042 Chronic combined systolic (congestive) and diastolic (congestive) heart failure: Secondary | ICD-10-CM | POA: Insufficient documentation

## 2017-11-10 DIAGNOSIS — M8580 Other specified disorders of bone density and structure, unspecified site: Secondary | ICD-10-CM | POA: Insufficient documentation

## 2017-11-10 DIAGNOSIS — Z955 Presence of coronary angioplasty implant and graft: Secondary | ICD-10-CM | POA: Diagnosis not present

## 2017-11-10 DIAGNOSIS — J449 Chronic obstructive pulmonary disease, unspecified: Secondary | ICD-10-CM | POA: Insufficient documentation

## 2017-11-10 DIAGNOSIS — I739 Peripheral vascular disease, unspecified: Secondary | ICD-10-CM | POA: Diagnosis not present

## 2017-11-10 DIAGNOSIS — Z8249 Family history of ischemic heart disease and other diseases of the circulatory system: Secondary | ICD-10-CM | POA: Insufficient documentation

## 2017-11-10 DIAGNOSIS — I482 Chronic atrial fibrillation: Secondary | ICD-10-CM | POA: Insufficient documentation

## 2017-11-10 DIAGNOSIS — E785 Hyperlipidemia, unspecified: Secondary | ICD-10-CM | POA: Insufficient documentation

## 2017-11-10 DIAGNOSIS — I5022 Chronic systolic (congestive) heart failure: Secondary | ICD-10-CM | POA: Diagnosis not present

## 2017-11-10 DIAGNOSIS — Z87891 Personal history of nicotine dependence: Secondary | ICD-10-CM | POA: Insufficient documentation

## 2017-11-10 DIAGNOSIS — Z79899 Other long term (current) drug therapy: Secondary | ICD-10-CM | POA: Diagnosis not present

## 2017-11-10 DIAGNOSIS — Z7982 Long term (current) use of aspirin: Secondary | ICD-10-CM | POA: Insufficient documentation

## 2017-11-10 DIAGNOSIS — Z881 Allergy status to other antibiotic agents status: Secondary | ICD-10-CM | POA: Insufficient documentation

## 2017-11-10 DIAGNOSIS — I447 Left bundle-branch block, unspecified: Secondary | ICD-10-CM | POA: Insufficient documentation

## 2017-11-10 DIAGNOSIS — Z818 Family history of other mental and behavioral disorders: Secondary | ICD-10-CM | POA: Insufficient documentation

## 2017-11-10 DIAGNOSIS — Z888 Allergy status to other drugs, medicaments and biological substances status: Secondary | ICD-10-CM | POA: Insufficient documentation

## 2017-11-10 DIAGNOSIS — I714 Abdominal aortic aneurysm, without rupture: Secondary | ICD-10-CM | POA: Diagnosis not present

## 2017-11-10 DIAGNOSIS — E039 Hypothyroidism, unspecified: Secondary | ICD-10-CM | POA: Diagnosis not present

## 2017-11-10 DIAGNOSIS — Z7901 Long term (current) use of anticoagulants: Secondary | ICD-10-CM | POA: Insufficient documentation

## 2017-11-10 DIAGNOSIS — K219 Gastro-esophageal reflux disease without esophagitis: Secondary | ICD-10-CM | POA: Insufficient documentation

## 2017-11-10 DIAGNOSIS — I130 Hypertensive heart and chronic kidney disease with heart failure and stage 1 through stage 4 chronic kidney disease, or unspecified chronic kidney disease: Secondary | ICD-10-CM | POA: Diagnosis not present

## 2017-11-10 DIAGNOSIS — N183 Chronic kidney disease, stage 3 (moderate): Secondary | ICD-10-CM | POA: Insufficient documentation

## 2017-11-10 DIAGNOSIS — I428 Other cardiomyopathies: Secondary | ICD-10-CM | POA: Diagnosis not present

## 2017-11-10 DIAGNOSIS — Z79890 Hormone replacement therapy (postmenopausal): Secondary | ICD-10-CM | POA: Insufficient documentation

## 2017-11-10 DIAGNOSIS — I5043 Acute on chronic combined systolic (congestive) and diastolic (congestive) heart failure: Secondary | ICD-10-CM | POA: Diagnosis present

## 2017-11-10 HISTORY — DX: Presence of automatic (implantable) cardiac defibrillator: Z95.810

## 2017-11-10 HISTORY — PX: BIV ICD INSERTION CRT-D: EP1195

## 2017-11-10 HISTORY — DX: Personal history of other diseases of the digestive system: Z87.19

## 2017-11-10 HISTORY — DX: Unspecified chronic bronchitis: J42

## 2017-11-10 HISTORY — DX: Pneumonia, unspecified organism: J18.9

## 2017-11-10 LAB — PROTIME-INR
INR: 2.65
Prothrombin Time: 28 seconds — ABNORMAL HIGH (ref 11.4–15.2)

## 2017-11-10 LAB — SURGICAL PCR SCREEN
MRSA, PCR: NEGATIVE
Staphylococcus aureus: NEGATIVE

## 2017-11-10 SURGERY — BIV ICD INSERTION CRT-D

## 2017-11-10 MED ORDER — ONDANSETRON HCL 4 MG/2ML IJ SOLN: 4.0000 mg | Freq: Four times a day (QID) | INTRAMUSCULAR | Status: DC | PRN

## 2017-11-10 MED ORDER — ACETAMINOPHEN 500 MG PO TABS: 1000.0000 mg | ORAL_TABLET | Freq: Four times a day (QID) | ORAL | Status: DC | PRN

## 2017-11-10 MED ORDER — PRAVASTATIN SODIUM 40 MG PO TABS
40.0000 mg | ORAL_TABLET | Freq: Every day | ORAL | Status: DC
  Administered 2017-11-11: 10:00:00 40 mg via ORAL
  Filled 2017-11-10 (×2): qty 1

## 2017-11-10 MED ORDER — METOPROLOL TARTRATE 5 MG/5ML IV SOLN
INTRAVENOUS | Status: DC | PRN
  Administered 2017-11-10: 5 mg via INTRAVENOUS

## 2017-11-10 MED ORDER — LEVOTHYROXINE SODIUM 25 MCG PO TABS
25.0000 ug | ORAL_TABLET | Freq: Every day | ORAL | Status: DC
  Administered 2017-11-11: 07:00:00 25 ug via ORAL
  Filled 2017-11-10: qty 1

## 2017-11-10 MED ORDER — IOPAMIDOL (ISOVUE-370) INJECTION 76%
INTRAVENOUS | Status: AC
  Filled 2017-11-10: qty 50

## 2017-11-10 MED ORDER — HYDROCODONE-ACETAMINOPHEN 5-325 MG PO TABS
1.0000 | ORAL_TABLET | ORAL | Status: DC | PRN
  Administered 2017-11-10 – 2017-11-11 (×3): 1 via ORAL
  Filled 2017-11-10 (×3): qty 1

## 2017-11-10 MED ORDER — SODIUM CHLORIDE 0.9 % IV SOLN
INTRAVENOUS | Status: AC
  Filled 2017-11-10: qty 2

## 2017-11-10 MED ORDER — FENTANYL CITRATE (PF) 100 MCG/2ML IJ SOLN
INTRAMUSCULAR | Status: AC
  Filled 2017-11-10: qty 2

## 2017-11-10 MED ORDER — LIDOCAINE HCL (PF) 1 % IJ SOLN
INTRAMUSCULAR | Status: AC
  Filled 2017-11-10: qty 60

## 2017-11-10 MED ORDER — ASPIRIN EC 81 MG PO TBEC
81.0000 mg | DELAYED_RELEASE_TABLET | Freq: Every day | ORAL | Status: DC
  Administered 2017-11-10: 17:00:00 81 mg via ORAL
  Filled 2017-11-10 (×2): qty 1

## 2017-11-10 MED ORDER — MIDAZOLAM HCL 5 MG/5ML IJ SOLN
INTRAMUSCULAR | Status: DC | PRN
  Administered 2017-11-10: 2 mg via INTRAVENOUS
  Administered 2017-11-10 (×6): 1 mg via INTRAVENOUS

## 2017-11-10 MED ORDER — YOU HAVE A PACEMAKER BOOK
Freq: Once | Status: AC
  Administered 2017-11-10: 22:00:00
  Filled 2017-11-10: qty 1

## 2017-11-10 MED ORDER — WARFARIN SODIUM 2 MG PO TABS: 2.0000 mg | ORAL_TABLET | ORAL | Status: DC

## 2017-11-10 MED ORDER — MUPIROCIN 2 % EX OINT
TOPICAL_OINTMENT | CUTANEOUS | Status: AC
  Administered 2017-11-10: 10:00:00
  Filled 2017-11-10: qty 22

## 2017-11-10 MED ORDER — VITAMIN D 1000 UNITS PO TABS
5000.0000 [IU] | ORAL_TABLET | Freq: Every day | ORAL | Status: DC
  Administered 2017-11-11: 5000 [IU] via ORAL
  Filled 2017-11-10 (×2): qty 5

## 2017-11-10 MED ORDER — METOPROLOL TARTRATE 5 MG/5ML IV SOLN
INTRAVENOUS | Status: AC
  Filled 2017-11-10: qty 5

## 2017-11-10 MED ORDER — MIDAZOLAM HCL 5 MG/5ML IJ SOLN
INTRAMUSCULAR | Status: AC
  Filled 2017-11-10: qty 5

## 2017-11-10 MED ORDER — MUPIROCIN 2 % EX OINT: 1.0000 "application " | TOPICAL_OINTMENT | Freq: Once | CUTANEOUS | Status: DC

## 2017-11-10 MED ORDER — FENTANYL CITRATE (PF) 100 MCG/2ML IJ SOLN
INTRAMUSCULAR | Status: DC | PRN
  Administered 2017-11-10 (×2): 25 ug via INTRAVENOUS
  Administered 2017-11-10 (×4): 12.5 ug via INTRAVENOUS

## 2017-11-10 MED ORDER — METOPROLOL SUCCINATE ER 25 MG PO TB24
25.0000 mg | ORAL_TABLET | Freq: Every day | ORAL | Status: DC
  Administered 2017-11-11: 10:00:00 25 mg via ORAL
  Filled 2017-11-10 (×2): qty 1

## 2017-11-10 MED ORDER — SODIUM CHLORIDE 0.9 % IV SOLN
INTRAVENOUS | Status: DC
  Administered 2017-11-10: 11:00:00 via INTRAVENOUS

## 2017-11-10 MED ORDER — SODIUM CHLORIDE 0.9 % IV SOLN
80.0000 mg | INTRAVENOUS | Status: AC
  Administered 2017-11-10: 80 mg

## 2017-11-10 MED ORDER — TORSEMIDE 20 MG PO TABS
80.0000 mg | ORAL_TABLET | Freq: Every day | ORAL | Status: DC
  Administered 2017-11-10: 80 mg via ORAL
  Filled 2017-11-10: qty 4

## 2017-11-10 MED ORDER — DIGOXIN 125 MCG PO TABS
0.1250 mg | ORAL_TABLET | Freq: Every day | ORAL | Status: DC
  Administered 2017-11-11: 0.125 mg via ORAL
  Filled 2017-11-10: qty 1

## 2017-11-10 MED ORDER — HEPARIN (PORCINE) IN NACL 1000-0.9 UT/500ML-% IV SOLN
INTRAVENOUS | Status: AC
  Filled 2017-11-10: qty 500

## 2017-11-10 MED ORDER — LIDOCAINE HCL (PF) 1 % IJ SOLN
INTRAMUSCULAR | Status: AC
  Filled 2017-11-10: qty 30

## 2017-11-10 MED ORDER — IPRATROPIUM-ALBUTEROL 0.5-2.5 (3) MG/3ML IN SOLN: 3.0000 mL | RESPIRATORY_TRACT | Status: DC | PRN

## 2017-11-10 MED ORDER — GABAPENTIN 600 MG PO TABS
300.0000 mg | ORAL_TABLET | Freq: Two times a day (BID) | ORAL | Status: DC
  Administered 2017-11-10 (×2): 300 mg via ORAL
  Filled 2017-11-10 (×3): qty 1

## 2017-11-10 MED ORDER — VANCOMYCIN HCL IN DEXTROSE 1-5 GM/200ML-% IV SOLN
1000.0000 mg | INTRAVENOUS | Status: AC
  Administered 2017-11-10: 1000 mg via INTRAVENOUS

## 2017-11-10 MED ORDER — POTASSIUM CHLORIDE CRYS ER 20 MEQ PO TBCR
40.0000 meq | EXTENDED_RELEASE_TABLET | Freq: Every day | ORAL | Status: DC
  Administered 2017-11-11: 10:00:00 40 meq via ORAL
  Filled 2017-11-10 (×2): qty 2

## 2017-11-10 MED ORDER — ALLOPURINOL 300 MG PO TABS
300.0000 mg | ORAL_TABLET | Freq: Every day | ORAL | Status: DC
  Administered 2017-11-11: 10:00:00 300 mg via ORAL
  Filled 2017-11-10 (×2): qty 1

## 2017-11-10 MED ORDER — ACETAMINOPHEN 325 MG PO TABS
325.0000 mg | ORAL_TABLET | ORAL | Status: DC | PRN
  Administered 2017-11-10: 17:00:00 650 mg via ORAL
  Filled 2017-11-10: qty 2

## 2017-11-10 MED ORDER — MONTELUKAST SODIUM 10 MG PO TABS
10.0000 mg | ORAL_TABLET | Freq: Every day | ORAL | Status: DC
  Administered 2017-11-10: 10 mg via ORAL
  Filled 2017-11-10: qty 1

## 2017-11-10 MED ORDER — SODIUM CHLORIDE 0.9 % IV SOLN
INTRAVENOUS | Status: DC | PRN
  Administered 2017-11-10 – 2017-11-11 (×2): 250 mL via INTRAVENOUS

## 2017-11-10 MED ORDER — PREDNISOLONE ACETATE 1 % OP SUSP
1.0000 [drp] | Freq: Two times a day (BID) | OPHTHALMIC | Status: DC
  Administered 2017-11-10 – 2017-11-11 (×2): 1 [drp] via OPHTHALMIC
  Filled 2017-11-10: qty 5

## 2017-11-10 MED ORDER — CHLORHEXIDINE GLUCONATE 4 % EX LIQD: 60.0000 mL | Freq: Once | CUTANEOUS | Status: DC

## 2017-11-10 MED ORDER — WARFARIN SODIUM 2 MG PO TABS
2.0000 mg | ORAL_TABLET | Freq: Once | ORAL | Status: AC
  Administered 2017-11-10: 19:00:00 2 mg via ORAL
  Filled 2017-11-10: qty 1

## 2017-11-10 MED ORDER — PANTOPRAZOLE SODIUM 40 MG PO TBEC
40.0000 mg | DELAYED_RELEASE_TABLET | Freq: Every day | ORAL | Status: DC
  Administered 2017-11-10 – 2017-11-11 (×2): 40 mg via ORAL
  Filled 2017-11-10 (×2): qty 1

## 2017-11-10 MED ORDER — VANCOMYCIN HCL IN DEXTROSE 1-5 GM/200ML-% IV SOLN
INTRAVENOUS | Status: AC
  Filled 2017-11-10: qty 200

## 2017-11-10 MED ORDER — WARFARIN - PHYSICIAN DOSING INPATIENT
Freq: Every day | Status: DC
  Administered 2017-11-10: 19:00:00

## 2017-11-10 MED ORDER — LIDOCAINE HCL (PF) 1 % IJ SOLN
INTRAMUSCULAR | Status: DC | PRN
  Administered 2017-11-10: 45 mL

## 2017-11-10 MED ORDER — IOPAMIDOL (ISOVUE-370) INJECTION 76%
INTRAVENOUS | Status: DC | PRN
  Administered 2017-11-10: 35 mL via INTRAVENOUS

## 2017-11-10 MED ORDER — MELATONIN 3 MG PO TABS
3.0000 mg | ORAL_TABLET | Freq: Every evening | ORAL | Status: DC | PRN
  Administered 2017-11-10: 22:00:00 3 mg via ORAL
  Filled 2017-11-10 (×2): qty 1

## 2017-11-10 MED ORDER — CEFAZOLIN SODIUM-DEXTROSE 1-4 GM/50ML-% IV SOLN
1.0000 g | Freq: Four times a day (QID) | INTRAVENOUS | Status: AC
  Administered 2017-11-10 – 2017-11-11 (×3): 1 g via INTRAVENOUS
  Filled 2017-11-10 (×3): qty 50

## 2017-11-10 SURGICAL SUPPLY — 19 items
BALLN CATH 6F 110CM (CATHETERS) ×1
BALLN CATH 6FR 110 (CATHETERS) ×2
CABLE SURGICAL S-101-97-12 (CABLE) ×3 IMPLANT
CATH ACUITYPRO 45CM EH 9F (CATHETERS) ×2 IMPLANT
CATH ACUITYPRO IC 90 7F (CATHETERS) ×2 IMPLANT
CATH BALLOON 6FR 110 (CATHETERS) IMPLANT
CATH HEX JOS 2-5-2 65CM 6F REP (CATHETERS) ×2 IMPLANT
ICD VIGILANT DF4 G247 (ICD Generator) ×2 IMPLANT
INGEVITY MRI 7740-45CM (Lead) ×3 IMPLANT
KIT ESSENTIALS PG (KITS) ×2 IMPLANT
LEAD ACUITY X4 4674 (Lead) ×2 IMPLANT
LEAD PACING INGEVITY MRI 45CM (Lead) IMPLANT
LEAD RELIANCE G DF4 0292 (Lead) ×2 IMPLANT
PAD PRO RADIOLUCENT 2001M-C (PAD) ×3 IMPLANT
SHEATH CLASSIC 7F (SHEATH) ×2 IMPLANT
SHEATH CLASSIC 9.5F (SHEATH) ×2 IMPLANT
SHEATH CLASSIC 9F (SHEATH) ×2 IMPLANT
TRAY PACEMAKER INSERTION (PACKS) ×3 IMPLANT
WIRE ACUITY WHISPER EDS 4648 (WIRE) ×2 IMPLANT

## 2017-11-10 NOTE — Progress Notes (Signed)
Lab called blood for PT was Hemolyzed. Will need to redraw. Waiting on IV team.

## 2017-11-10 NOTE — Interval H&P Note (Signed)
History and Physical Interval Note:  11/10/2017 1:05 PM  Katie [name]  has presented today for surgery, with the diagnosis of chf - left bundle branch block  The various methods of treatment have been discussed with the patient and family. After consideration of risks, benefits and other options for treatment, the patient has consented to  Procedure(s): BIV ICD INSERTION CRT-D (N/A) as a surgical intervention .  The patient's history has been reviewed, patient examined, no change in status, stable for surgery.  I have reviewed the patient's chart and labs.  Questions were answered to the patient's satisfaction.     Cristopher Peru

## 2017-11-10 NOTE — Progress Notes (Signed)
IV team unable to start iv Cath lab called and informed states they will place iv there. Blood sent to lab for PT drawn with butterfly.

## 2017-11-10 NOTE — Discharge Instructions (Signed)
° ° °  Supplemental Discharge Instructions for  Pacemaker/Defibrillator Patients  Activity No heavy lifting or vigorous activity with your left/right arm for 6 to 8 weeks.  Do not raise your left/right arm above your head for one week.  Gradually raise your affected arm as drawn below.             11/14/17                        11/15/17                      11/16/17                    11/17/17 __  NO DRIVING for 1 week  ; you may begin driving on  4/0/10  .  WOUND CARE - Keep the wound area clean and dry.  Do not get this area wet for one week. No showers for one week; you may shower on  11/17/17  . - The tape/steri-strips on your wound will fall off; do not pull them off.  No bandage is needed on the site.  DO  NOT apply any creams, oils, or ointments to the wound area. - If you notice any drainage or discharge from the wound, any swelling or bruising at the site, or you develop a fever > 101? F after you are discharged home, call the office at once.  Special Instructions - You are still able to use cellular telephones; use the ear opposite the side where you have your pacemaker/defibrillator.  Avoid carrying your cellular phone near your device. - When traveling through airports, show security personnel your identification card to avoid being screened in the metal detectors.  Ask the security personnel to use the hand wand. - Avoid arc welding equipment, MRI testing (magnetic resonance imaging), TENS units (transcutaneous nerve stimulators).  Call the office for questions about other devices. - Avoid electrical appliances that are in poor condition or are not properly grounded. - Microwave ovens are safe to be near or to operate.  Additional information for defibrillator patients should your device go off: - If your device goes off ONCE and you feel fine afterward, notify the device clinic nurses. - If your device goes off ONCE and you do not feel well afterward, call 911. - If your device goes  off TWICE, call 911. - If your device goes off THREE times in one day, call 911.  DO NOT DRIVE YOURSELF OR A FAMILY MEMBER WITH A DEFIBRILLATOR TO THE HOSPITAL--CALL 911.

## 2017-11-10 NOTE — Discharge Summary (Addendum)
ELECTROPHYSIOLOGY PROCEDURE DISCHARGE SUMMARY    Patient ID: Katie [name],  MRN: [id number], DOB/AGE: 1940-08-04 77 y.o.  Admit date: 11/10/2017 Discharge date: 11/11/17  Primary Care Physician: [name], MD  Electrophysiologist: Dr. Lovena Le  Primary Discharge Diagnosis:  1. DCM 2. Chronic CHF (systolic/diatolic)  Secondary Discharge Diagnosis:  1. Permanent AFib     CHA2DS2Vasc is at least 4, on warfarin     Followed by her PMD 2. CAD 3. HTN 4. CKD (III)  Allergies  Allergen Reactions  . Amiodarone Other (See Comments)    PULMONARY TOXICITY  . Diovan [Valsartan] Other (See Comments)    HYPOTENSION  . Doxycycline Diarrhea and Other (See Comments)    VISUAL DISTURBANCE  . Flexeril [Cyclobenzaprine] Other (See Comments)    FATIGUE  . Keflex [Cephalexin] Diarrhea  . Verapamil Other (See Comments)    EDEMA  . Codeine Hives     Procedures This Admission:  1.  Implantation of a CRT-D on 11/10/17 by Dr Lovena Le.  The patient received a [employer] (serial Number [id number]) biventricular ICD, [employer] (serial # [id number] ) right atrial lead and a [employer] (serial number [id number]) right ventricular defibrillator lead, [employer](serial number [phone] [id number]) lead (LV) DFT's were deferred at time of implant   There were no immediate post procedure complications. 2.  CXR on  demonstrated no pneumothorax status post device implantation.   Brief HPI: Katie [name] is a 77 y.o. female is followed in the outpatient setting recommended CRT-D with worsening EF and CHF.  Past medical includes above.  The patient has persistent LV dysfunction despite guideline directed therapy.  Risks, benefits, and alternatives to ICD implantation were reviewed with the patient who wished to proceed.   Hospital Course:  The patient was admitted and underwent implantation of a CRT-D with details as outlined above. She was monitored on telemetry overnight which demonstrated AFib, V pacing predominantly,  occ PVCs, rare NSVT, 4 beat.  Left chest has a small hematoma.  INR today is 3.19, she is instructed to hold her warfarin for 3 days. Dr. Lovena Le has authorized 3 days of Vicodin for the patient, NCCSRS was reviewed, she has no active narcotic prescriptions.  She was advised not to use acetaminophen in combination with the Vicodin and not to drive while taking.  She reports no allergy to Vicodin, has taken before.  The device was interrogated and found to be functioning normally.  CXR was obtained and demonstrated no pneumothorax status post device implantation.  Wound care, arm mobility, and restrictions were reviewed with the patient.  The patient feels well this morning outside of site discomfort, no CP or SOB, she was examined by Dr. Lovena Le and considered stable for discharge to home.   The patient's discharge medications include an ACE-I (none given CKD) and beta blocker (metoprolol).   Physical Exam: Vitals:   11/11/17 0059 11/11/17 0433 11/11/17 0437 11/11/17 0849  BP:  124/63 124/63 120/71  Pulse:  80 70 70  Resp: (!) 24 (!) 28 15 (!) 24  Temp:  98.1 F (36.7 C)  97.8 F (36.6 C)  TempSrc:  Oral    SpO2:  94% 91% 93%  Weight:  89.4 kg    Height:        GEN- The patient is well appearing, alert and oriented x 3 today.   HEENT: normocephalic, atraumatic; sclera clear, conjunctiva pink; hearing intact; oropharynx clear Lungs- CTA b/l, normal work of breathing.  No wheezes, rales, rhonchi  Heart- RRR (paced), no murmurs, rubs or gallops, PMI not laterally displaced GI- soft, non-tender, non-distended Extremities- no clubbing, cyanosis, or edema MS- no significant deformity or atrophy Skin- warm and dry, no rash or lesion, left chest without a small hematoma, no bleeding Psych- euthymic mood, full affect Neuro- no gross defecits  Labs:   Lab Results  Component Value Date   WBC 8.7 11/08/2017   HGB 14.1 11/08/2017   HCT 42.2 11/08/2017   MCV 86.7 11/08/2017   PLT 282 11/08/2017      Recent Labs  Lab 11/08/17 0918  NA 138  K 4.6  CL 100  CO2 25  BUN 27*  CREATININE 1.60*  CALCIUM 9.9  PROT 6.5  BILITOT 0.7  ALT 16  AST 21  GLUCOSE 86    Discharge Medications:  Allergies as of 11/11/2017      Reactions   Amiodarone Other (See Comments)   PULMONARY TOXICITY   Diovan [valsartan] Other (See Comments)   HYPOTENSION   Doxycycline Diarrhea, Other (See Comments)   VISUAL DISTURBANCE   Flexeril [cyclobenzaprine] Other (See Comments)   FATIGUE   Keflex [cephalexin] Diarrhea   Verapamil Other (See Comments)   EDEMA   Codeine Hives      Medication List    TAKE these medications   acetaminophen 500 MG tablet Commonly known as:  TYLENOL Take 1,000 mg by mouth every 6 (six) hours as needed for moderate pain. Notes to patient:  Do not use in combination with the Vicodin pain medicine that also contains acetaminophen   albuterol 108 (90 Base) MCG/ACT inhaler Commonly known as:  PROVENTIL HFA;VENTOLIN HFA 2 inhalations 10-15 minutes apart evert 4 hours as needed for Asthma Flare What changed:    how much to take  how to take this  when to take this  reasons to take this  additional instructions   allopurinol 300 MG tablet Commonly known as:  ZYLOPRIM Take 300 mg by mouth daily.   aspirin EC 81 MG tablet Take 81 mg by mouth daily. Notes to patient:  Hold for 3 days, resume 11/14/17   digoxin 0.125 MG tablet Commonly known as:  LANOXIN Take 0.125 mg by mouth daily.   gabapentin 600 MG tablet Commonly known as:  NEURONTIN Take 0.5 tablets (300 mg total) by mouth 2 (two) times daily.   HYDROcodone-acetaminophen 5-325 MG tablet Commonly known as:  NORCO/VICODIN Take 1 tablet by mouth every 6 (six) hours as needed for moderate pain or severe pain.   ipratropium 0.06 % nasal spray Commonly known as:  ATROVENT Use 1 to 2 sprays each Nostril 2 to 3 x / day as needed What changed:    how much to take  how to take this  when to take  this  reasons to take this  additional instructions   ipratropium-albuterol 0.5-2.5 (3) MG/3ML Soln Commonly known as:  DUONEB Take 3 mLs by nebulization every 4 (four) hours as needed (shortness of breath).   isosorbide mononitrate 30 MG 24 hr tablet Commonly known as:  IMDUR Take 1 tablet daily for BP & Heart What changed:    how much to take  how to take this  when to take this  additional instructions   levothyroxine 50 MCG tablet Commonly known as:  SYNTHROID, LEVOTHROID Take 0.5 tablets (25 mcg total) by mouth daily before breakfast.   Melatonin 3 MG Tabs Take 1 tablet (3 mg total) by mouth at bedtime as needed (insomnia).   metoprolol succinate  25 MG 24 hr tablet Commonly known as:  TOPROL-XL Take 1 tablet (25 mg total) by mouth daily.   montelukast 10 MG tablet Commonly known as:  SINGULAIR Take 10 mg by mouth at bedtime.   MUCINEX PO Take 1 tablet by mouth daily as needed (runny nose).   Olopatadine HCl 0.2 % Soln Place 1 drop into both eyes daily.   pantoprazole 40 MG tablet Commonly known as:  PROTONIX Take 1 tablet (40 mg total) by mouth daily.   potassium chloride SA 20 MEQ tablet Commonly known as:  K-DUR,KLOR-CON TAKE 2 TABLETS (40 MEQ TOTAL) BY MOUTH DAILY. What changed:    how much to take  how to take this  when to take this  additional instructions   pravastatin 40 MG tablet Commonly known as:  PRAVACHOL Take 40 mg by mouth daily.   prednisoLONE acetate 1 % ophthalmic suspension Commonly known as:  PRED FORTE Place 1 drop into both eyes 2 (two) times daily.   prochlorperazine 5 MG tablet Commonly known as:  COMPAZINE TAKE 1 TABLET BY MOUTH THREE TIMES A DAY AS NEEDED FOR VERTIGO OR NAUSEA What changed:  See the new instructions.   SYSTANE OP Place 1 drop into both eyes 4 (four) times daily as needed (dry eyes).   torsemide 20 MG tablet Commonly known as:  DEMADEX Take 4 tablets (80 mg total) by mouth daily.    triamcinolone cream 0.1 % Commonly known as:  KENALOG Apply 1 application topically daily.   TUDORZA PRESSAIR 400 MCG/ACT Aepb Generic drug:  Aclidinium Bromide Inhale 1 puff into the lungs 2 (two) times daily.   Vitamin D3 5000 units Caps Take 5,000 Units by mouth daily.   warfarin 2 MG tablet Commonly known as:  COUMADIN Take 2-3 mg by mouth See admin instructions. Take 2 mg by mouth on Thursday, Friday, Saturday morning, take (3 mg) on Sunday, Monday, Tuesday, Wednesday morning. Notes to patient:  Hold for 3 days, resume 11/14/17 at your current dosing schedule and follow up with Dr. Wayland Denis as scheduled       Disposition: Home  Discharge Instructions    Diet - low sodium heart healthy   Complete by:  As directed    Increase activity slowly   Complete by:  As directed      Follow-up Information    [facility] Follow up on 11/23/2017.   Specialty:  Cardiology Why:  10:00AM Contact information: [address], [address] [place]       Evans Lance, MD Follow up on 02/13/2018.   Specialty:  Cardiology Why:  10:00AM Contact information: [address] [address] [phone]        [name], MD Follow up on 11/17/2017.   Specialty:  Internal Medicine Why:  11:30AM, doctor visit and coumadin check Contact information: [address] [place] [address] [phone]           Duration of Discharge Encounter: Greater than 30 minutes including physician time.  Venetia Night, PA-C 11/11/2017 9:21 AM   EP Attending  Patient seen and examined. Agree with above. She is doing well after Biv ICD insertion. She will be discharged home with usual followup. Interogation of her device under my supervision demonstrates normal device function.  Mikle Bosworth.D.

## 2017-11-11 ENCOUNTER — Ambulatory Visit (HOSPITAL_COMMUNITY): Payer: Medicare Other

## 2017-11-11 ENCOUNTER — Encounter (HOSPITAL_COMMUNITY): Payer: Self-pay | Admitting: Internal Medicine

## 2017-11-11 DIAGNOSIS — M8580 Other specified disorders of bone density and structure, unspecified site: Secondary | ICD-10-CM | POA: Diagnosis not present

## 2017-11-11 DIAGNOSIS — I429 Cardiomyopathy, unspecified: Secondary | ICD-10-CM | POA: Diagnosis not present

## 2017-11-11 DIAGNOSIS — I5022 Chronic systolic (congestive) heart failure: Secondary | ICD-10-CM | POA: Diagnosis not present

## 2017-11-11 DIAGNOSIS — I482 Chronic atrial fibrillation: Secondary | ICD-10-CM | POA: Diagnosis not present

## 2017-11-11 DIAGNOSIS — Z006 Encounter for examination for normal comparison and control in clinical research program: Secondary | ICD-10-CM | POA: Diagnosis not present

## 2017-11-11 DIAGNOSIS — I739 Peripheral vascular disease, unspecified: Secondary | ICD-10-CM | POA: Diagnosis not present

## 2017-11-11 DIAGNOSIS — Z79899 Other long term (current) drug therapy: Secondary | ICD-10-CM | POA: Diagnosis not present

## 2017-11-11 DIAGNOSIS — Z888 Allergy status to other drugs, medicaments and biological substances status: Secondary | ICD-10-CM | POA: Diagnosis not present

## 2017-11-11 DIAGNOSIS — I714 Abdominal aortic aneurysm, without rupture: Secondary | ICD-10-CM | POA: Diagnosis not present

## 2017-11-11 DIAGNOSIS — Z95810 Presence of automatic (implantable) cardiac defibrillator: Secondary | ICD-10-CM | POA: Diagnosis not present

## 2017-11-11 DIAGNOSIS — I447 Left bundle-branch block, unspecified: Secondary | ICD-10-CM | POA: Diagnosis not present

## 2017-11-11 DIAGNOSIS — Z7901 Long term (current) use of anticoagulants: Secondary | ICD-10-CM | POA: Diagnosis not present

## 2017-11-11 DIAGNOSIS — Z885 Allergy status to narcotic agent status: Secondary | ICD-10-CM | POA: Diagnosis not present

## 2017-11-11 DIAGNOSIS — Z87891 Personal history of nicotine dependence: Secondary | ICD-10-CM | POA: Diagnosis not present

## 2017-11-11 DIAGNOSIS — K219 Gastro-esophageal reflux disease without esophagitis: Secondary | ICD-10-CM | POA: Diagnosis not present

## 2017-11-11 DIAGNOSIS — N183 Chronic kidney disease, stage 3 (moderate): Secondary | ICD-10-CM | POA: Diagnosis not present

## 2017-11-11 DIAGNOSIS — J449 Chronic obstructive pulmonary disease, unspecified: Secondary | ICD-10-CM | POA: Diagnosis not present

## 2017-11-11 DIAGNOSIS — Z9049 Acquired absence of other specified parts of digestive tract: Secondary | ICD-10-CM | POA: Diagnosis not present

## 2017-11-11 DIAGNOSIS — Z7982 Long term (current) use of aspirin: Secondary | ICD-10-CM | POA: Diagnosis not present

## 2017-11-11 DIAGNOSIS — E785 Hyperlipidemia, unspecified: Secondary | ICD-10-CM | POA: Diagnosis not present

## 2017-11-11 DIAGNOSIS — Z881 Allergy status to other antibiotic agents status: Secondary | ICD-10-CM | POA: Diagnosis not present

## 2017-11-11 DIAGNOSIS — I130 Hypertensive heart and chronic kidney disease with heart failure and stage 1 through stage 4 chronic kidney disease, or unspecified chronic kidney disease: Secondary | ICD-10-CM | POA: Diagnosis not present

## 2017-11-11 DIAGNOSIS — Z955 Presence of coronary angioplasty implant and graft: Secondary | ICD-10-CM | POA: Diagnosis not present

## 2017-11-11 DIAGNOSIS — E039 Hypothyroidism, unspecified: Secondary | ICD-10-CM | POA: Diagnosis not present

## 2017-11-11 DIAGNOSIS — I5042 Chronic combined systolic (congestive) and diastolic (congestive) heart failure: Secondary | ICD-10-CM | POA: Diagnosis not present

## 2017-11-11 LAB — PROTIME-INR
INR: 3.19
Prothrombin Time: 32.4 seconds — ABNORMAL HIGH (ref 11.4–15.2)

## 2017-11-11 MED ORDER — HYDROCODONE-ACETAMINOPHEN 5-325 MG PO TABS: 1.0000 | ORAL_TABLET | Freq: Four times a day (QID) | ORAL | 0 refills | Status: DC | PRN

## 2017-11-17 ENCOUNTER — Telehealth: Payer: Self-pay

## 2017-11-17 ENCOUNTER — Encounter: Payer: Self-pay | Admitting: Internal Medicine

## 2017-11-17 ENCOUNTER — Ambulatory Visit (INDEPENDENT_AMBULATORY_CARE_PROVIDER_SITE_OTHER): Payer: Medicare Other | Admitting: Internal Medicine

## 2017-11-17 VITALS — BP 118/62 | HR 80 | Temp 97.3°F | Resp 16 | Ht 63.0 in | Wt 196.0 lb

## 2017-11-17 DIAGNOSIS — I482 Chronic atrial fibrillation, unspecified: Secondary | ICD-10-CM

## 2017-11-17 DIAGNOSIS — Z79899 Other long term (current) drug therapy: Secondary | ICD-10-CM

## 2017-11-17 DIAGNOSIS — I10 Essential (primary) hypertension: Secondary | ICD-10-CM | POA: Diagnosis not present

## 2017-11-17 DIAGNOSIS — E039 Hypothyroidism, unspecified: Secondary | ICD-10-CM

## 2017-11-17 DIAGNOSIS — Z7901 Long term (current) use of anticoagulants: Secondary | ICD-10-CM | POA: Diagnosis not present

## 2017-11-17 DIAGNOSIS — I5042 Chronic combined systolic (congestive) and diastolic (congestive) heart failure: Secondary | ICD-10-CM | POA: Diagnosis not present

## 2017-11-17 NOTE — Progress Notes (Signed)
Katie [name]     This very nice 77 y.o.  MWF was admitted to the hospital on  11/10/2017  and patient was discharged from the hospital on  11/11/2017. The patient now presents for follow up for transition from recent hospitalization.  The day after discharge  our clinical staff contacted the patient to assure stability and schedule a follow up appointment. The discharge summary, medications and diagnostic test results were reviewed before meeting with the patient. The patient was admitted for:   Chronic combined systolic and diastolic congestive heart failure  Chronic atrial fibrillation Essential hypertension Hypothyroidism     Patient with cAfib and hx amiodarone induced Pulm. Fibrosis  had a recent hospitalization for systolic/diastoloic CHF. Because of worsening heart failure with EF dropping from 45% down to 35% , progressive LBBB and ventricular dyssynchrony, patient was admitted by Dr Lovena Le for [employer] ICD implantation. The procedure was accomplished and patient was discharged.  Patient had been advised during recent hospitalization to decrease her Thyroxine from 75 to 25 mcg and in the intermin TSH was elevated to 10.2. Patient relates she was referred to the wound ctr for dressing changes.  Patient is also being followed closely for her kidney functions. Dr Lovena Le messaged this examiner yesterday concern for hematoma at the pacer implantation site and recommended no coumadin for at least 1 week.  She had been off of coumadin since 8/27 til present , but did take 1 dose of Coumadin today. She is advised to remain off of Coumadin per Dr Tanna Furry recommendations til she returns in 5 days for PT/INR recheck . Patient does report that her breathing is better.      Hospitalization discharge instructions and medications are reconciled with the patient.      Patient is also followed with Hypertension, Hyperlipidemia, Pre-Diabetes and Vitamin D Deficiency.      Patient has hx/o HTN since  1999 and her BP has been controlled at home. Today's BP is at goal -118/62. Patient has had no complaints of any cardiac type chest pain, palpitations, dyspnea/orthopnea/PND, dizziness, claudication, or dependent edema.     Hyperlipidemia is near controlled with diet & meds. Patient denies myalgias or other med SE's. Last Lipids were near goal Lab Results  Component Value Date   CHOL 172 09/29/2017   HDL 43 (L) 09/29/2017   LDLCALC 103 (H) 09/29/2017   TRIG 159 (H) 09/29/2017   CHOLHDL 4.0 09/29/2017      Also, the patient has history of Morbid Obesity (BMI 36+) and PreDiabetes and has had no symptoms of reactive hypoglycemia, diabetic polys, paresthesias or visual blurring.  Last A1c was not at goal: Lab Results  Component Value Date   HGBA1C 6.0 (H) 09/29/2017        Patient was dx'd hypothyroid in 2012.       Further, the patient also has history of Vitamin D Deficiency and supplements vitamin D without any suspected side-effects. Last vitamin D was at goal:  Lab Results  Component Value Date   VD25OH 71 09/29/2017   Current Outpatient Medications on File Prior to Visit  Medication Sig  . acetaminophen (TYLENOL) 500 MG tablet Take 1,000 mg by mouth every 6 (six) hours as needed for moderate pain.   . Aclidinium Bromide (TUDORZA PRESSAIR) 400 MCG/ACT AEPB Inhale 1 puff into the lungs 2 (two) times daily.  Marland Kitchen albuterol (PROAIR HFA) 108 (90 Base) MCG/ACT inhaler 2 inhalations 10-15 minutes apart evert 4 hours as needed for Asthma Flare (  Patient taking differently: Inhale 2 puffs into the lungs every 4 (four) hours as needed for shortness of breath (asthma flare). )  . allopurinol (ZYLOPRIM) 300 MG tablet Take 300 mg by mouth daily.  Marland Kitchen aspirin EC 81 MG tablet Take 81 mg by mouth daily.  . Cholecalciferol (VITAMIN D3) 5000 units CAPS Take 5,000 Units by mouth daily.   . digoxin (LANOXIN) 0.125 MG tablet Take 0.125 mg by mouth daily.  Marland Kitchen gabapentin (NEURONTIN) 600 MG tablet Take 0.5  tablets (300 mg total) by mouth 2 (two) times daily.  Marland Kitchen guaiFENesin (MUCINEX PO) Take 1 tablet by mouth daily as needed (runny nose).  Marland Kitchen HYDROcodone-acetaminophen (NORCO/VICODIN) 5-325 MG tablet Take 1 tablet by mouth every 6 (six) hours as needed for moderate pain or severe pain.  Marland Kitchen ipratropium (ATROVENT) 0.06 % nasal spray Use 1 to 2 sprays each Nostril 2 to 3 x / day as needed (Patient taking differently: Place 1-2 sprays into both nostrils 3 (three) times daily as needed for rhinitis (congestion). )  . ipratropium-albuterol (DUONEB) 0.5-2.5 (3) MG/3ML SOLN Take 3 mLs by nebulization every 4 (four) hours as needed (shortness of breath).  . isosorbide mononitrate (IMDUR) 30 MG 24 hr tablet Take 1 tablet daily for BP & Heart (Patient taking differently: Take 30 mg by mouth daily. )  . levothyroxine (SYNTHROID, LEVOTHROID) 50 MCG tablet Take 0.5 tablets (25 mcg total) by mouth daily before breakfast.  . Melatonin 3 MG TABS Take 1 tablet (3 mg total) by mouth at bedtime as needed (insomnia).  . metoprolol succinate (TOPROL-XL) 25 MG 24 hr tablet Take 1 tablet (25 mg total) by mouth daily.  . montelukast (SINGULAIR) 10 MG tablet Take 10 mg by mouth at bedtime.  . Olopatadine HCl 0.2 % SOLN Place 1 drop into both eyes daily.   . pantoprazole (PROTONIX) 40 MG tablet Take 1 tablet (40 mg total) by mouth daily.  [name] Glycol-Propyl Glycol (SYSTANE OP) Place 1 drop into both eyes 4 (four) times daily as needed (dry eyes).   . potassium chloride SA (KLOR-CON M20) 20 MEQ tablet TAKE 2 TABLETS (40 MEQ TOTAL) BY MOUTH DAILY. (Patient taking differently: Take 40 mEq by mouth daily. )  . pravastatin (PRAVACHOL) 40 MG tablet Take 40 mg by mouth daily.  . prednisoLONE acetate (PRED FORTE) 1 % ophthalmic suspension Place 1 drop into both eyes 2 (two) times daily.   . prochlorperazine (COMPAZINE) 5 MG tablet TAKE 1 TABLET BY MOUTH THREE TIMES A DAY AS NEEDED FOR VERTIGO OR NAUSEA (Patient taking differently:  Take 5 mg by mouth daily as needed for nausea (vertigo). )  . torsemide (DEMADEX) 20 MG tablet Take 4 tablets (80 mg total) by mouth daily.  Marland Kitchen triamcinolone cream (KENALOG) 0.1 % Apply 1 application topically daily.   Marland Kitchen warfarin (COUMADIN) 2 MG tablet Take 2-3 mg by mouth See admin instructions. Take 2 mg by mouth on Thursday, Friday, Saturday morning, take (3 mg) on Sunday, Monday, Tuesday, Wednesday morning.   No current facility-administered medications on file prior to visit.    Allergies  Allergen Reactions  . Amiodarone Other (See Comments)    PULMONARY TOXICITY  . Diovan [Valsartan] Other (See Comments)    HYPOTENSION  . Doxycycline Diarrhea and Other (See Comments)    VISUAL DISTURBANCE  . Flexeril [Cyclobenzaprine] Other (See Comments)    FATIGUE  . Keflex [Cephalexin] Diarrhea  . Verapamil Other (See Comments)    EDEMA  . Codeine Hives  PMHx:   Past Medical History:  Diagnosis Date  . AAA (abdominal aortic aneurysm) ([place])   . AICD (automatic cardioverter/defibrillator) present 11/10/2017  . Arthritis    "some in my knees" (11/10/2017)  . Atrial fibrillation ([place])   . CHF (congestive heart failure) ([place])   . Chronic bronchitis ([place])   . COPD (chronic obstructive pulmonary disease) ([place])    'they say I don't; have a problem breathing though" (11/10/2017)  . Depression   . GERD (gastroesophageal reflux disease)   . Gout    "daily RX" (11/10/2017)  . History of hiatal hernia   . Hyperlipidemia   . Hypertension   . Hypothyroid   . Migraine headache    "hx; none since 1980s" (11/10/2017)  . Osteopenia   . Pneumonia    "~ 3 times" (11/10/2017)  . PVD (peripheral vascular disease) ([place])    Immunization History  Administered Date(s) Administered  . Influenza, High Dose Seasonal PF 12/20/2013, 12/09/2014, 11/14/2015, 01/10/2017  . Influenza-Unspecified 01/02/2013  . PPD Test 07/17/2016  . Pneumococcal Conjugate-13 01/23/2014  . Pneumococcal-Unspecified 03/15/1993,  05/31/2008  . Td 03/16/2007  . Tdap 08/11/2017  . Varicella 02/19/2008   Past Surgical History:  Procedure Laterality Date  . ABDOMINAL AORTIC ENDOVASCULAR STENT GRAFT N/A 04/11/2013   Procedure: ABDOMINAL AORTIC ENDOVASCULAR STENT GRAFT WITH RIGHT FEMORAL PATCH ANGIOPLASTY;  Surgeon: Mal Misty, MD;  Location: [facility];  Service: Vascular;  Laterality: N/A;  . BIV ICD INSERTION CRT-D  11/10/2017  . BIV ICD INSERTION CRT-D N/A 11/10/2017   Procedure: BIV ICD INSERTION CRT-D;  Surgeon: Evans Lance, MD;  Location: [facility];  Service: Cardiovascular;  Laterality: N/A;  . BREAST SURGERY     LEFT BREAST BIOPSY  . CARDIAC CATHETERIZATION    . CATARACT EXTRACTION W/ INTRAOCULAR LENS  IMPLANT, BILATERAL Bilateral   . CHOLECYSTECTOMY OPEN  1972  . COLONOSCOPY    . EYE SURGERY Bilateral    "bleeding in my eyes"  . FRACTURE SURGERY    . TIBIA FRACTURE SURGERY Left 1970s  . TONSILLECTOMY    . VARICOSE VEIN SURGERY Bilateral    "laser"   FHx:    Reviewed / unchanged  SHx:    Reviewed / unchanged  Systems Review:  Constitutional: Denies fever, chills, wt changes, headaches, insomnia, fatigue, night sweats, change in appetite. Eyes: Denies redness, blurred vision, diplopia, discharge, itchy, watery eyes.  ENT: Denies discharge, congestion, post nasal drip, epistaxis, sore throat, earache, hearing loss, dental pain, tinnitus, vertigo, sinus pain, snoring.  CV: Denies chest pain, palpitations, irregular heartbeat, syncope, dyspnea, diaphoresis, orthopnea, PND, claudication or edema. Respiratory: denies cough, dyspnea, DOE, pleurisy, hoarseness, laryngitis, wheezing.  Gastrointestinal: Denies dysphagia, odynophagia, heartburn, reflux, water brash, abdominal pain or cramps, nausea, vomiting, bloating, diarrhea, constipation, hematemesis, melena, hematochezia  or hemorrhoids. Genitourinary: Denies dysuria, frequency, urgency, nocturia, hesitancy, discharge, hematuria or flank  pain. Musculoskeletal: Denies arthralgias, myalgias, stiffness, jt. swelling, pain, limping or strain/sprain.  Skin: Denies pruritus, rash, hives, warts, acne, eczema or change in skin lesion(s). Neuro: No weakness, tremor, incoordination, spasms, paresthesia or pain. Psychiatric: Denies confusion, memory loss or sensory loss. Endo: Denies change in weight, skin or hair change.  Heme/Lymph: No excessive bleeding, bruising or enlarged lymph nodes.  Physical Exam  BP 118/62   Pulse 80   Temp (!) 97.3 F (36.3 C)   Resp 16   Ht 5\' 3"  (1.6 m)   Wt 196 lb (88.9 kg)   BMI 34.72 kg/m  Appears well nourished, well groomed  and in no distress.  Eyes: PERRLA, EOMs, conjunctiva no swelling or erythema. Sinuses: No frontal/maxillary tenderness ENT/Mouth: EAC's clear, TM's nl w/o erythema, bulging. Nares clear w/o erythema, swelling, exudates. Oropharynx clear without erythema or exudates. Oral hygiene is good. Tongue normal, non obstructing. Hearing intact.  Neck: Supple. Thyroid nl. Car 2+/2+ without bruits, nodes or JVD. Chest: Respirations nl with BS clear & equal w/o rales, rhonchi, wheezing or stridor.  Cor: Heart sounds normal w/ regular rate and rhythm without sig. murmurs, gallops, clicks or rubs. Peripheral pulses normal and equal  without edema.  Abdomen: Soft & bowel sounds normal. Non-tender w/o guarding, rebound, hernias, masses or organomegaly.  Lymphatics: Unremarkable.  Musculoskeletal: Full ROM all peripheral extremities, joint stability, 5/5 strength and normal gait.  Skin: Warm, dry without exposed rashes, lesions or ecchymosis apparent.  Neuro: Cranial nerves intact, reflexes equal bilaterally. Sensory-motor testing grossly intact. Tendon reflexes grossly intact.  Pysch: Alert & oriented x 3.  Insight and judgement nl & appropriate. No ideations.  Assessment and Plan:   1. Chronic combined systolic and diastolic congestive heart failure (HCC)  - COMPLETE METABOLIC  PANEL WITH GFR  2. Chronic atrial fibrillation (HCC)  - Protime-INR - COMPLETE METABOLIC PANEL WITH GFR  3. Essential hypertension  - Continue medication, monitor blood pressure at home.  - Continue DASH diet. Reminder to go to the ER if any CP,  SOB, nausea, dizziness, severe HA, changes vision/speech.  - CBC with Differential/Platelet - COMPLETE METABOLIC PANEL WITH GFR  4. Hypothyroidism, unspecified type   5. Long term current use of anticoagulant therapy  - Protime-INR - COMPLETE METABOLIC PANEL WITH GFR  6. Medication management  - CBC with Differential/Platelet - COMPLETE METABOLIC PANEL WITH GFR  - Continue diet, exercise, lifestyle modifications.  - Monitor appropriate labs. - Continue supplementation.      Discussed  regular exercise, BP monitoring, weight control to achieve/maintain BMI less than 25 and discussed meds and SE's. Recommended labs to assess and monitor clinical status with further disposition pending results of labs. Over 30 minutes of exam, counseling, chart review was performed.

## 2017-11-17 NOTE — Telephone Encounter (Signed)
Pt went to her primary care and he told her to stop the blood thinner for 5 days but she do not want to stop the blood thinner. I let her talk with the device tech nurse.

## 2017-11-17 NOTE — Telephone Encounter (Signed)
I reviewed Dr. Idell Pickles note from today where he advised her to remain off the coumadin for 5 more days and have a PT?INR drawn next Tuesday. She verbalizes understanding but is still concerned. I let her know that Dr. Lovena Le and Dr. Melford Aase are in agreement with this plan and they believe that the benefit is outweighing the risk of being off coumadin for this duration. She is hopeful that it will work out but relays concern since her daughter had a stroke while off of her medication. I let her know that I could not give her different advice than what Dr. Melford Aase and Dr. Lovena Le have given her. I confirmed her wound check appointment for Wednesday 11/23/17.

## 2017-11-18 ENCOUNTER — Other Ambulatory Visit: Payer: Self-pay | Admitting: Internal Medicine

## 2017-11-18 LAB — COMPLETE METABOLIC PANEL WITH GFR
AG RATIO: 1.8 (calc) (ref 1.0–2.5)
ALBUMIN MSPROF: 4 g/dL (ref 3.6–5.1)
ALT: 10 U/L (ref 6–29)
AST: 17 U/L (ref 10–35)
Alkaline phosphatase (APISO): 38 U/L (ref 33–130)
BILIRUBIN TOTAL: 0.7 mg/dL (ref 0.2–1.2)
BUN/Creatinine Ratio: 14 (calc) (ref 6–22)
BUN: 22 mg/dL (ref 7–25)
CHLORIDE: 102 mmol/L (ref 98–110)
CO2: 26 mmol/L (ref 20–32)
Calcium: 9.9 mg/dL (ref 8.6–10.4)
Creat: 1.62 mg/dL — ABNORMAL HIGH (ref 0.60–0.93)
GFR, EST AFRICAN AMERICAN: 36 mL/min/{1.73_m2} — AB (ref >=60)
GFR, Est Non African American: 31 mL/min/{1.73_m2} — ABNORMAL LOW (ref >=60)
Globulin: 2.2 g/dL (calc) (ref 1.9–3.7)
Glucose, Bld: 84 mg/dL (ref 65–99)
POTASSIUM: 4.8 mmol/L (ref 3.5–5.3)
Sodium: 139 mmol/L (ref 135–146)
TOTAL PROTEIN: 6.2 g/dL (ref 6.1–8.1)

## 2017-11-18 LAB — CBC WITH DIFFERENTIAL/PLATELET
BASOS PCT: 0.5 %
Basophils Absolute: 43 cells/uL (ref 0–200)
Eosinophils Absolute: 146 cells/uL (ref 15–500)
Eosinophils Relative: 1.7 %
HEMATOCRIT: 42.9 % (ref 35.0–45.0)
HEMOGLOBIN: 14.2 g/dL (ref 11.7–15.5)
LYMPHS ABS: 1720 {cells}/uL (ref 850–3900)
MCH: 29 pg (ref 27.0–33.0)
MCHC: 33.1 g/dL (ref 32.0–36.0)
MCV: 87.7 fL (ref 80.0–100.0)
MPV: 11.1 fL (ref 7.5–12.5)
Monocytes Relative: 11.1 %
NEUTROS ABS: 5736 {cells}/uL (ref 1500–7800)
Neutrophils Relative %: 66.7 %
Platelets: 233 10*3/uL (ref 140–400)
RBC: 4.89 10*6/uL (ref 3.80–5.10)
RDW: 14.8 % (ref 11.0–15.0)
Total Lymphocyte: 20 %
WBC mixed population: 955 cells/uL — ABNORMAL HIGH (ref 200–950)
WBC: 8.6 10*3/uL (ref 3.8–10.8)

## 2017-11-18 LAB — PROTIME-INR
INR: 1.8 — ABNORMAL HIGH
Prothrombin Time: 19.4 s — ABNORMAL HIGH (ref 9.0–11.5)

## 2017-11-18 MED FILL — Heparin Sod (Porcine)-NaCl IV Soln 1000 Unit/500ML-0.9%: INTRAVENOUS | Qty: 500 | Status: AC

## 2017-11-22 ENCOUNTER — Encounter: Payer: Self-pay | Admitting: Internal Medicine

## 2017-11-22 ENCOUNTER — Ambulatory Visit (INDEPENDENT_AMBULATORY_CARE_PROVIDER_SITE_OTHER): Payer: Medicare Other | Admitting: Internal Medicine

## 2017-11-22 ENCOUNTER — Other Ambulatory Visit: Payer: Self-pay | Admitting: *Deleted

## 2017-11-22 VITALS — BP 122/74 | HR 80 | Temp 97.3°F | Resp 16 | Ht 63.0 in | Wt 199.0 lb

## 2017-11-22 DIAGNOSIS — I482 Chronic atrial fibrillation, unspecified: Secondary | ICD-10-CM

## 2017-11-22 DIAGNOSIS — J310 Chronic rhinitis: Secondary | ICD-10-CM

## 2017-11-22 DIAGNOSIS — Z79899 Other long term (current) drug therapy: Secondary | ICD-10-CM

## 2017-11-22 DIAGNOSIS — Z7901 Long term (current) use of anticoagulants: Secondary | ICD-10-CM | POA: Diagnosis not present

## 2017-11-22 DIAGNOSIS — I10 Essential (primary) hypertension: Secondary | ICD-10-CM | POA: Diagnosis not present

## 2017-11-22 MED ORDER — LEVOTHYROXINE SODIUM 50 MCG PO TABS: ORAL_TABLET | ORAL | 1 refills | Status: DC

## 2017-11-22 MED ORDER — IPRATROPIUM BROMIDE 0.06 % NA SOLN: NASAL | 3 refills | Status: DC

## 2017-11-22 NOTE — Progress Notes (Signed)
Subjective:    Patient ID: Katie [name], female    DOB: 1940-06-13, 77 y.o.   MRN: [id number]  HPI   This nice 76 yo MWF with cAfib,  COPD, Combined sys/dias HF had recent implantation of a BiV AICD by Dr Lovena Le on 11/11/2017 with Coumadin held per Dr Tanna Furry recommendation for prolonged INR and concern of implant site hematoma.  PT/INR was down to 1.8 x on 11/17/2017. Patient does report improved sense of well being and stamina now after implant.   Medication Sig  . Acetaminophen 500 MG tablet Take 1,000 mg  every 6  hours as needed for moderate pain.   . TUDORZA PRESSAIR 400  Inhale 1 puff  2  times daily.  Marland Kitchen PROAIR HFA inhaler 2 inhalations 10-15 minutes apart evert 4 hours as needed for Asthma   . allopurinol  300 MG tablet Take 300 mg by mouth daily.  Marland Kitchen aspirin EC 81 MG tablet Take 81 mg by mouth daily.  . Cholecalciferol (VITAMIN D3) 5000 units CAPS Take 5,000 Units by mouth daily.   . digoxin (LANOXIN) 0.125 MG tablet Take 0.125 mg by mouth daily.  Marland Kitchen gabapentin  600 MG tablet Take 0.5 tablets  2 (two) times daily.  Marland Kitchen MUCINEX  Take 1 tablet by mouth daily as needed  . DUONEB SOLN Take 3 mLs by neb every 4  hours as needed   . isosorbide  30 MG 24 hr tablet Take 1 tablet daily   . levothyroxine 50 MCG tablet Take 1 tab daily before breakfast.  . metoprolol succ-XL 25 MG  Take 1 tablet (25 mg total) by mouth daily.  . montelukast  10 MG tablet Take 10 mg by mouth at bedtime.  . Olopatadine HCl 0.2 % SOLN Place 1 drop into both eyes daily.   . pantoprazole  40 MG tablet Take 1 tablet (40 mg total) by mouth daily.  Carren Rang Place 1 drop into both eyes 4 (four) times daily as needed (dry eyes).   . potassium chloride 20 MEQ tablet Take 40 mEq  daily.   . pravastatin 40 MG tablet Take 40 mg b daily.  Marland Kitchen PRED FORTE 1 % ophth susp Place 1 drop into  eyes 2  times daily.   . COMPAZINE 5 MG tablet Take  daily as needed for nausea   . torsemide  20 MG tablet Take 4 tablets  daily.  Marland Kitchen  triamcinolone crm 0.1 % Apply 1 application topically daily.   Marland Kitchen ipratropium  0.06 % nasal spray Place 1-2 sprays intonostrils 3 x daily as needed for rhinitis  . warfarin  2 MG tablet  Patient not taking: Reported on 11/22/2017  . NORCO 5-325 MG tablet Take 1 tablet  everyhours as needed  . Melatonin 3 MG TABS Take 1 tablet  at bedtime as needed (insomnia).    Allergies  Allergen Reactions  . Amiodarone Other (See Comments)    PULMONARY TOXICITY  . Diovan [Valsartan] Other (See Comments)    HYPOTENSION  . Doxycycline Diarrhea and Other (See Comments)    VISUAL DISTURBANCE  . Flexeril [Cyclobenzaprine] Other (See Comments)    FATIGUE  . Keflex [Cephalexin] Diarrhea  . Verapamil Other (See Comments)    EDEMA  . Codeine Hives   Past Medical History:  Diagnosis Date  . AAA (abdominal aortic aneurysm) ([place])   . AICD (automatic cardioverter/defibrillator) present 11/10/2017  . Arthritis    "some in my knees" (11/10/2017)  . Atrial fibrillation ([place])   .  CHF (congestive heart failure) ([place])   . Chronic bronchitis ([place])   . COPD (chronic obstructive pulmonary disease) ([place])    'they say I don't; have a problem breathing though" (11/10/2017)  . Depression   . GERD (gastroesophageal reflux disease)   . Gout    "daily RX" (11/10/2017)  . History of hiatal hernia   . Hyperlipidemia   . Hypertension   . Hypothyroid   . Migraine headache    "hx; none since 1980s" (11/10/2017)  . Osteopenia   . Pneumonia    "~ 3 times" (11/10/2017)  . PVD (peripheral vascular disease) ([place])    Review of Systems   10 point systems review negative except as above.    Objective:   Physical Exam  BP 122/74   Pulse 80   Temp (!) 97.3 F (36.3 C)   Resp 16   Ht 5\' 3"  (1.6 m)   Wt 199 lb (90.3 kg)   BMI 35.25 kg/m   HEENT - WNL. Neck - supple.  Chest - Clear equal BS. Cor - Soft HS. RRR w/o sig m. PP 1(+). 1+ ankle edema. Edema. Abd- Soft. Benign. MS- FROM w/o deformities.  Gait Nl. Neuro -   Nl w/o focal abnormalities.    Assessment & Plan:   1. Chronic atrial fibrillation (HCC)  - Protime-INR  2. Essential hypertension  - CBC with Differential/Platelet  3. Medication management  - CBC with Differential/Platelet  4. Long term current use of anticoagulant therapy  - Protime-INR  - advised to restart Coumadin 2 mg x 3 d - MWF and # mg x 4 d - TThSS  - has f/u OV in 10 days

## 2017-11-22 NOTE — Patient Instructions (Signed)
Bleeding Precautions When on Anticoagulant Therapy  WHAT IS ANTICOAGULANT THERAPY?  Anticoagulant therapy is taking medicine to prevent or reduce blood clots. It is also called blood thinner therapy. Blood clots that form in your blood vessels can be dangerous. They can break loose and travel to your heart, lungs, or brain. This increases your risk of a heart attack or stroke. Anticoagulant therapy causes blood to clot more slowly.  You may need anticoagulant therapy if you have:   A medical condition that increases the likelihood that blood clots will form.   A heart defect or a problem with heart rhythm.  It is also a common treatment after heart surgery, such as valve replacement.  WHAT ARE COMMON TYPES OF ANTICOAGULANT THERAPY?  Anticoagulant medicine can be injected or taken by mouth.If you need anticoagulant therapy quickly at the hospital, the medicine may be injected under your skin or given through an IV tube. Heparin is a common example of an anticoagulant that you may get at the hospital.  Most anticoagulant therapy is in the form of pills that you take at home every day. These may include:   Aspirin. This common blood thinner works by preventing blood cells (platelets) from sticking together to form a clot. Aspirin is not as strong as anticoagulants that slow down the time that it takes for your body to form a clot.   Clopidogrel. This is a newer type of drug that affects platelets. It is stronger than aspirin.   Warfarin. This is the most common anticoagulant. It changes the way your body uses vitamin K, a vitamin that helps your blood to clot. The risk of bleeding is higher with warfarin than with aspirin. You will need frequent blood tests to make sure you are taking the safest amount.   New anticoagulants. Several new drugs have been approved. They are all taken by mouth. Studies show that these drugs work as well as warfarin. They do not require blood testing. They may cause less bleeding  risk than warfarin.  WHAT DO I NEED TO REMEMBER WHEN TAKING ANTICOAGULANT THERAPY?  Anticoagulant therapy decreases your risk of forming a blood clot, but it increases your risk of bleeding. Work closely with your health care provider to make sure you are taking your medicine safely. These tips can help:   Learn ways to reduce your risk of bleeding.   If you are taking warfarin:  ? Have blood tests as ordered by your health care provider.  ? Do not make any sudden changes to your diet. Vitamin K in your diet can make warfarin less effective.  ? Do not get pregnant. This medicine may cause birth defects.   Take your medicine at the same time every day. If you forget to take your medicine, take it as soon as you remember. If you miss a whole day, do not double your dose of medicine. Take your normal dose and call your health care provider to check in.   Do not stop taking your medicine on your own.   Tell your health care provider before you start taking any new medicine, vitamin, or herbal product. Some of these could interfere with your therapy.   Tell all of your health care providers that you are on anticoagulant therapy.   Do not have surgery, medical procedures, or dental work until you tell your health care provider that you are on anticoagulant therapy.  WHAT CAN AFFECT HOW ANTICOAGULANTS WORK?  Certain foods, vitamins, medicines, supplements, and herbal   medicines change the way that anticoagulant therapy works. They may increase or decrease the effects of your anticoagulant therapy. Either result can be dangerous for you.   Many over-the-counter medicines for pain, colds, or stomach problems interfere with anticoagulant therapy. Take these only as told by your health care provider.   Do not drink alcohol. It can interfere with your medicine and increase your risk of an injury that causes bleeding.   If you are taking warfarin, do not begin eating more foods that contain vitamin K. These include  leafy green vegetables. Ask your health care provider if you should avoid any foods.  WHAT ARE SOME WAYS TO PREVENT BLEEDING?  You can prevent bleeding by taking certain precautions:   Be extra careful when you use knives, scissors, or other sharp objects.   Use an electric razor instead of a blade.   Do not use toothpicks.   Use a soft toothbrush.   Wear shoes that have nonskid soles.   Use bath mats and handrails in your bathroom.   Wear gloves while you do yard work.   Wear a helmet when you ride a bike.   Wear your seat belt.   Prevent falls by removing loose rugs and extension cords from areas where you walk.   Do not play contact sports or participate in other activities that have a high risk of injury.  WHEN SHOULD I CONTACT MY HEALTH CARE PROVIDER?  Call your health care provider if:   You miss a dose of medicine:  ? And you are not sure what to do.  ? For more than one day.   You have:  ? Menstrual bleeding that is heavier than normal.  ? Blood in your urine.  ? A bloody nose or bleeding gums.  ? Easy bruising.  ? Blood in your stool (feces) or have black and tarry stool.  ? Side effects from your medicine.   You feel weak or dizzy.   You become pregnant.  Seek immediate medical care if:   You have bleeding that will not stop.   You have sudden and severe headache or belly pain.   You vomit or you cough up bright red blood.   You have a severe blow to your head.  WHAT ARE SOME QUESTIONS TO ASK MY HEALTH CARE PROVIDER?   What is the best anticoagulant therapy for my condition?   What side effects should I watch for?   When should I take my medicine? What should I do if I forget to take it?   Will I need to have regular blood tests?   Do I need to change my diet? Are there foods or drinks that I should avoid?   What activities are safe for me?   What should I do if I want to get pregnant?  This information is not intended to replace advice given to you by your health care provider.  Make sure you discuss any questions you have with your health care provider.  Document Released: 02/10/2015 Document Reviewed: 02/10/2015  Elsevier Interactive Patient Education  2017 [employer]

## 2017-11-23 ENCOUNTER — Ambulatory Visit (INDEPENDENT_AMBULATORY_CARE_PROVIDER_SITE_OTHER): Payer: Medicare Other | Admitting: *Deleted

## 2017-11-23 DIAGNOSIS — R001 Bradycardia, unspecified: Secondary | ICD-10-CM

## 2017-11-23 DIAGNOSIS — I5022 Chronic systolic (congestive) heart failure: Secondary | ICD-10-CM | POA: Diagnosis not present

## 2017-11-23 LAB — CBC WITH DIFFERENTIAL/PLATELET
BASOS PCT: 0.6 %
Basophils Absolute: 53 cells/uL (ref 0–200)
EOS ABS: 202 {cells}/uL (ref 15–500)
Eosinophils Relative: 2.3 %
HCT: 43.9 % (ref 35.0–45.0)
HEMOGLOBIN: 14.3 g/dL (ref 11.7–15.5)
Lymphs Abs: 1628 cells/uL (ref 850–3900)
MCH: 28.9 pg (ref 27.0–33.0)
MCHC: 32.6 g/dL (ref 32.0–36.0)
MCV: 88.9 fL (ref 80.0–100.0)
MONOS PCT: 9.6 %
MPV: 10.6 fL (ref 7.5–12.5)
NEUTROS ABS: 6072 {cells}/uL (ref 1500–7800)
Neutrophils Relative %: 69 %
Platelets: 230 10*3/uL (ref 140–400)
RBC: 4.94 10*6/uL (ref 3.80–5.10)
RDW: 14.7 % (ref 11.0–15.0)
Total Lymphocyte: 18.5 %
WBC: 8.8 10*3/uL (ref 3.8–10.8)
WBCMIX: 845 {cells}/uL (ref 200–950)

## 2017-11-23 LAB — PROTIME-INR
INR: 1.6 — ABNORMAL HIGH
Prothrombin Time: 16.8 s — ABNORMAL HIGH (ref 9.0–11.5)

## 2017-11-23 MED ORDER — METOPROLOL SUCCINATE ER 50 MG PO TB24: 50.0000 mg | ORAL_TABLET | Freq: Every day | ORAL | 3 refills | Status: DC

## 2017-11-23 NOTE — Progress Notes (Signed)
   Wound check appointment. Steri-strips removed. Incision edges approximated, wound well healed. Normal device function. healing stages of bruising noted. See Epic encounter for image. Thresholds, sensing, and impedances consistent with implant measurements. Device programmed at 3.5V on for extra safety margin until 3 month visit. Pt Bi-Vp 38% due to AF rates reviewed with GT orders given to increase Toprol XL to 50mg . Known AF + Coumadin. No high ventricular rates noted. Patient educated about wound care, arm mobility, lifting restrictions. ROV 02/13/2018 w/ GT

## 2017-11-28 ENCOUNTER — Telehealth (INDEPENDENT_AMBULATORY_CARE_PROVIDER_SITE_OTHER): Payer: Self-pay

## 2017-11-28 NOTE — Telephone Encounter (Signed)
Per Artis Delay, work in to the schedule for ankle injection tomorrow morning (9/17) at 0815. I called patient and advised.

## 2017-11-28 NOTE — Telephone Encounter (Signed)
Can you please ask Katie [name] about this?? Patient called stating she needed to be seen ASAP for ankle pain because she needed an injection. I scheduled her for Thursday because this was the next available. However, she wanted me to check with you to see if you could possibly see her sooner and if not any recommendations to help with pain until she gets injection. Please advise. Thanks. [phone]

## 2017-11-29 ENCOUNTER — Ambulatory Visit (INDEPENDENT_AMBULATORY_CARE_PROVIDER_SITE_OTHER): Payer: Medicare Other | Admitting: Physician Assistant

## 2017-11-29 ENCOUNTER — Encounter (INDEPENDENT_AMBULATORY_CARE_PROVIDER_SITE_OTHER): Payer: Self-pay | Admitting: Orthopaedic Surgery

## 2017-11-29 DIAGNOSIS — M19172 Post-traumatic osteoarthritis, left ankle and foot: Secondary | ICD-10-CM

## 2017-11-29 MED ORDER — LIDOCAINE HCL 1 % IJ SOLN
2.0000 mL | INTRAMUSCULAR | Status: AC | PRN
  Administered 2017-11-29: 2 mL

## 2017-11-29 MED ORDER — METHYLPREDNISOLONE ACETATE 40 MG/ML IJ SUSP
40.0000 mg | INTRAMUSCULAR | Status: AC | PRN
  Administered 2017-11-29: 40 mg via INTRA_ARTICULAR

## 2017-11-29 NOTE — Progress Notes (Signed)
   Procedure Note  Patient: Katie [name]             Date of Birth: Apr 04, 1940           MRN: [id number]             Visit Date: 11/29/2017 HPI: Ms. Bruhn returns today requesting injection in her left ankle.  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  She said no new injury.  She denies any fevers chills.  She did recently have a defibrillator placed and is still recovering from that.  Physical exam: Left foot dorsal pedal pulses intact.  Brawny changes and some edema of the left lower leg calf supple nontender.  She is limited dorsiflexion plantarflexion of the left ankle with some discomfort.  Procedures: Visit Diagnoses: Post-traumatic arthritis of ankle, left  Medium Joint Inj on 11/29/2017 8:30 AM Details: anterolateral approach Medications: 2 mL lidocaine 1 %; 40 mg methylPREDNISolone acetate 40 MG/ML Consent was given by the patient. Patient was prepped and draped in the usual sterile fashion.     Plan: She will follow-up on as-needed basis.  The ankle was wrapped with an Ace wrap initially on today.  Elevation and icing of the ankle were encouraged.  Questions encouraged and answered.

## 2017-11-30 ENCOUNTER — Ambulatory Visit: Payer: Medicare Other | Admitting: Internal Medicine

## 2017-12-01 ENCOUNTER — Ambulatory Visit (INDEPENDENT_AMBULATORY_CARE_PROVIDER_SITE_OTHER): Payer: Medicare Other | Admitting: Physician Assistant

## 2017-12-01 ENCOUNTER — Other Ambulatory Visit: Payer: Self-pay | Admitting: Physician Assistant

## 2017-12-01 NOTE — Progress Notes (Signed)
Assessment and Plan:  Diagnoses and all orders for this visit:  Chronic atrial fibrillation ([place]) Check INR and will adjust medication according to labs.  Discussed if patient falls to immediately contact office or go to ER. Discussed foods that can increase or decrease Coumadin levels. Patient understands to call the office before starting a new medication. Follow up in one month.   Essential hypertension Continue medication Monitor blood pressure at home; call if consistently over 130/80 Continue DASH diet.   Reminder to go to the ER if any CP, SOB, nausea, dizziness, severe HA, changes vision/speech, left arm numbness and tingling and jaw pain.  Chronic combined systolic and diastolic congestive heart failure (HCC) Weights stable/down Emphasized salt restriction, less than 2000mg  a day. Encouraged daily monitoring of the patient's weight, call office if 5 lb weight loss or gain in a day.  Encouraged regular exercise. If any increasing shortness of breath, swelling, or chest pressure go to ER immediately.  decrease your fluid intake to less than 2 L daily please remember to always increase your potassium intake with any increase of your fluid pill.   COPD/Pulmonary fibrosis Symptoms stable; continue inhalers Follow up with pulmonology  Morbid obesity ([place]) Long discussion about weight loss, diet, and exercise Recommended diet heavy in fruits and veggies and low in animal meats, cheeses, and dairy products, appropriate calorie intake She will work on slowly increasing exercise, cut down on portions/snacks Discussed appropriate weight for height and initial weight goal (185 lb) Follow up at next visit  Medication management CBC, INR   Further disposition pending results of labs. Discussed med's effects and SE's.   Over 30 minutes of exam, counseling, chart review, and critical decision making was performed.   Future Appointments  Date Time Provider [place]   12/13/2017 11:30 AM Tanda Rockers, MD [facility] None  01/06/2018 11:15 AM [name], MD [facility] None  02/02/2018  9:00 AM [name], MD [facility] None  02/13/2018 10:00 AM Evans Lance, MD [facility]  08/23/2018  2:00 PM Liane Comber, NP [facility] None    ------------------------------------------------------------------------------------------------------------------   HPI BP 122/64   Pulse 74   Temp (!) 97 F (36.1 C)   Ht 5\' 3"  (1.6 m)   Wt 196 lb (88.9 kg)   SpO2 97%   BMI 34.72 kg/m   77 y.o.female presents for 1 month follow up for coumadin/chronic care management. Patient has hx/o Chronic Afib since 1999 & has been on Coumadin since. 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 In 2014, Lexiscan showed EF 50%. She had an endovascular graft placed in 2015 and is followed by vascular. 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 She recently had implantation of a BiV AICD by Dr Lovena Le on 11/11/2017 and reports significantly improved stamina and quality of life. Coumadin was held post-op per Dr. Lovena Le for concern of hematoma but has since been resumed.   BMI is Body mass index is 34.72 kg/m., she has been working on diet and exercise, she has cut out salt, walking up and down driveway, will restart going [facility].   Her blood pressure has been controlled at home, today their BP is BP: 122/64  She does workout, walking up and down driveway. She denies chest pain, shortness of breath, dizziness.  She has a history of Combined Systolic and Diastolic, denies dyspnea on exertion, orthopnea, paroxysmal nocturnal dyspnea and edema. Positive for none.  Wt Readings from Last 3 Encounters:  12/02/17 196 lb (88.9 kg)  11/22/17 199 lb (90.3 kg)  11/17/17 196 lb (88.9 kg)   Patient is on Coumadin for Permanent atrial fibrillation  ([place]) [I48.2] Patient's last INR is  Lab Results  Component Value Date   INR 1.6 (H) 11/22/2017   INR 1.8 (H) 11/17/2017   INR 3.19 11/11/2017    Patient denies SOB, CP, dizziness, nose bleeds, easy bleeding, and blood in stool/urine. Her coumadin dose was changed last visit. She has not taken ABX, has not missed any doses and denies a fall.    Coumadin: 2 mg 3 x/week on MonWedFri  & 3 mg 4 x /week on TuesThSatSun    Past Medical History:  Diagnosis Date  . AAA (abdominal aortic aneurysm) ([place])   . AICD (automatic cardioverter/defibrillator) present 11/10/2017  . Arthritis    "some in my knees" (11/10/2017)  . Atrial fibrillation ([place])   . CHF (congestive heart failure) ([place])   . Chronic bronchitis ([place])   . COPD (chronic obstructive pulmonary disease) ([place])    'they say I don't; have a problem breathing though" (11/10/2017)  . Depression   . GERD (gastroesophageal reflux disease)   . Gout    "daily RX" (11/10/2017)  . History of hiatal hernia   . Hyperlipidemia   . Hypertension   . Hypothyroid   . Migraine headache    "hx; none since 1980s" (11/10/2017)  . Osteopenia   . Pneumonia    "~ 3 times" (11/10/2017)  . PVD (peripheral vascular disease) (HCC)      Allergies  Allergen Reactions  . Amiodarone Other (See Comments)    PULMONARY TOXICITY  . Diovan [Valsartan] Other (See Comments)    HYPOTENSION  . Doxycycline Diarrhea and Other (See Comments)    VISUAL DISTURBANCE  . Flexeril [Cyclobenzaprine] Other (See Comments)    FATIGUE  . Keflex [Cephalexin] Diarrhea  . Verapamil Other (See Comments)    EDEMA  . Codeine Hives    Current Outpatient Medications on File Prior to Visit  Medication Sig  . acetaminophen (TYLENOL) 500 MG tablet Take 1,000 mg by mouth every 6 (six) hours as needed for moderate pain.   . Aclidinium Bromide (TUDORZA PRESSAIR) 400 MCG/ACT AEPB Inhale 1 puff into the lungs as needed.   Marland Kitchen albuterol (PROAIR HFA) 108 (90 Base) MCG/ACT inhaler 2  inhalations 10-15 minutes apart evert 4 hours as needed for Asthma Flare (Patient taking differently: Inhale 2 puffs into the lungs every 4 (four) hours as needed for shortness of breath (asthma flare). )  . allopurinol (ZYLOPRIM) 300 MG tablet Take 300 mg by mouth daily.  Marland Kitchen aspirin EC 81 MG tablet Take 81 mg by mouth daily.  . Cholecalciferol (VITAMIN D3) 5000 units CAPS Take 5,000 Units by mouth daily.   . digoxin (LANOXIN) 0.125 MG tablet Take 0.125 mg by mouth daily.  . Eyelid Cleansers (AVENOVA) 0.01 % SOLN APPLY A SMALL AMOUNT TO SKIN TWICE A DAY AS DIRECTED  . gabapentin (NEURONTIN) 600 MG tablet Take 0.5 tablets (300 mg total) by mouth 2 (two) times daily.  Marland Kitchen guaiFENesin (MUCINEX PO) Take 1 tablet by mouth daily as needed (runny nose).  Marland Kitchen ipratropium (ATROVENT) 0.06 % nasal spray Use 1 to 2 sprays each Nostril 2 to 3 x / day as needed  . ipratropium-albuterol (DUONEB) 0.5-2.5 (3) MG/3ML SOLN Take 3 mLs by nebulization every 4 (four) hours as needed (shortness of breath).  . isosorbide mononitrate (IMDUR)  30 MG 24 hr tablet Take 1 tablet daily for BP & Heart (Patient taking differently: Take 30 mg by mouth daily. )  . levothyroxine (SYNTHROID, LEVOTHROID) 50 MCG tablet Take 1 tablet daily on an empty stomach with only water for 30 minutes.  . metoprolol succinate (TOPROL-XL) 50 MG 24 hr tablet Take 1 tablet (50 mg total) by mouth daily. Take with or immediately following a meal.  . montelukast (SINGULAIR) 10 MG tablet Take 10 mg by mouth at bedtime.  . Olopatadine HCl 0.2 % SOLN Place 1 drop into both eyes daily.   . pantoprazole (PROTONIX) 40 MG tablet Take 1 tablet (40 mg total) by mouth daily.  [name] Glycol-Propyl Glycol (SYSTANE OP) Place 1 drop into both eyes 4 (four) times daily as needed (dry eyes).   . potassium chloride SA (KLOR-CON M20) 20 MEQ tablet TAKE 2 TABLETS (40 MEQ TOTAL) BY MOUTH DAILY.  . pravastatin (PRAVACHOL) 40 MG tablet Take 40 mg by mouth daily.  .  prednisoLONE acetate (PRED FORTE) 1 % ophthalmic suspension Place 1 drop into both eyes 2 (two) times daily.   . prochlorperazine (COMPAZINE) 5 MG tablet TAKE 1 TABLET BY MOUTH THREE TIMES A DAY AS NEEDED FOR VERTIGO OR NAUSEA (Patient taking differently: Take 5 mg by mouth daily as needed for nausea (vertigo). )  . torsemide (DEMADEX) 20 MG tablet Take 4 tablets (80 mg total) by mouth daily.  Marland Kitchen triamcinolone cream (KENALOG) 0.1 % Apply 1 application topically daily.   Marland Kitchen warfarin (COUMADIN) 2 MG tablet Take 2-3 mg by mouth See admin instructions. Take 2 mg by mouth on Thursday, Friday, Saturday morning, take (3 mg) on Sunday, Monday, Tuesday, Wednesday morning.  . warfarin (COUMADIN) 2 MG tablet TAKE 1 TO 2 TABLETS BY MOUTH DAILY OR AS DIRECTED   No current facility-administered medications on file prior to visit.     ROS: Review of Systems  Constitutional: Negative for malaise/fatigue and weight loss.  HENT: Negative for hearing loss and tinnitus.   Eyes: Negative for blurred vision and double vision.  Respiratory: Negative for cough, sputum production, shortness of breath and wheezing.   Cardiovascular: Positive for leg swelling (mild at the end of the day, improving). Negative for chest pain, palpitations, orthopnea, claudication and PND.  Gastrointestinal: Negative for abdominal pain, blood in stool, constipation, diarrhea, heartburn, melena, nausea and vomiting.  Genitourinary: Negative.   Musculoskeletal: Positive for joint pain (left foot arthritis). Negative for myalgias.  Skin: Negative for rash.  Neurological: Negative for dizziness, tingling, sensory change, weakness and headaches.  Endo/Heme/Allergies: Positive for environmental allergies. Negative for polydipsia.  Psychiatric/Behavioral: Negative.   All other systems reviewed and are negative.     Physical Exam:  BP 122/64   Pulse 74   Temp (!) 97 F (36.1 C)   Ht 5\' 3"  (1.6 m)   Wt 196 lb (88.9 kg)   SpO2 97%   BMI  34.72 kg/m   General Appearance: Well nourished, in no apparent distress. Eyes: PERRLA, EOMs, conjunctiva no swelling or erythema Sinuses: No Frontal/maxillary tenderness ENT/Mouth: Ext aud canals clear, TMs without erythema, bulging. No erythema, swelling, or exudate on post pharynx.  Tonsils not swollen or erythematous. Hearing normal.  Neck: Supple, thyroid normal.  Respiratory: Respiratory effort normal, BS equal bilaterally without rales, rhonchi, wheezing or stridor.  Cardio: Soft heart sounds, Rhythm irregular, normal rate with 3/6 systolic murmur. Thready peripheral pulses upper and lower extremities without significant edema, pacemaker in place, no edema/erythema, surigical  site healing well  Abdomen: Soft, + BS.  Non tender, no guarding, rebound, hernias, masses. Lymphatics: Non tender without lymphadenopathy.  Musculoskeletal: Full ROM, 5/5 strength, normal gait.  Skin: Warm, dry without rashes, lesions, ecchymosis.  Neuro: Cranial nerves intact. Normal muscle tone, no cerebellar symptoms. Sensation intact.  Psych: Awake and oriented X 3, normal affect, Insight and Judgment appropriate.     Izora Ribas, NP 11:44 AM [facility]

## 2017-12-02 ENCOUNTER — Ambulatory Visit (INDEPENDENT_AMBULATORY_CARE_PROVIDER_SITE_OTHER): Payer: Medicare Other | Admitting: Adult Health

## 2017-12-02 ENCOUNTER — Encounter: Payer: Self-pay | Admitting: Adult Health

## 2017-12-02 VITALS — BP 122/64 | HR 74 | Temp 97.0°F | Ht 63.0 in | Wt 196.0 lb

## 2017-12-02 DIAGNOSIS — Z79899 Other long term (current) drug therapy: Secondary | ICD-10-CM

## 2017-12-02 DIAGNOSIS — Z23 Encounter for immunization: Secondary | ICD-10-CM

## 2017-12-02 DIAGNOSIS — Z7901 Long term (current) use of anticoagulants: Secondary | ICD-10-CM | POA: Diagnosis not present

## 2017-12-02 DIAGNOSIS — I5022 Chronic systolic (congestive) heart failure: Secondary | ICD-10-CM | POA: Diagnosis not present

## 2017-12-02 DIAGNOSIS — I10 Essential (primary) hypertension: Secondary | ICD-10-CM | POA: Diagnosis not present

## 2017-12-02 DIAGNOSIS — J8410 Pulmonary fibrosis, unspecified: Secondary | ICD-10-CM | POA: Diagnosis not present

## 2017-12-02 DIAGNOSIS — I4821 Permanent atrial fibrillation: Secondary | ICD-10-CM

## 2017-12-02 DIAGNOSIS — I482 Chronic atrial fibrillation: Secondary | ICD-10-CM | POA: Diagnosis not present

## 2017-12-02 NOTE — Patient Instructions (Signed)
Biventricular Pacemaker Implantation, Care After Refer to this sheet in the next few weeks. These instructions provide you with information about caring for yourself after your procedure. Your health care provider may also give you more specific instructions. Your treatment has been planned according to current medical practices, but problems sometimes occur. Call your health care provider if you have any problems or questions after your procedure. What can I expect after the procedure? After the procedure, it is common to have:  Mild pain or soreness in your chest for several days.  A small amount of blood or clear fluid coming from your incision.  A slight bump in your chest where the pulse generator was placed. You may be able to feel the generator under your skin. This is normal.  Follow these instructions at home: Medicines  Take over-the-counter and prescription medicines only as told by your health care provider.  Do not take any new medicines without asking your health care provider first.  If you were prescribed an antibiotic medicine, take it as told by your health care provider. Do not stop taking the antibiotic even if you start to feel better. Incision care   Keep your incision area clean and dry.  Follow instructions from your health care provider about how to take care of your incision. Make sure you: ? Wash your hands with soap and water before you change your bandage (dressing). If soap and water are not available, use hand sanitizer. ? Change your dressing as told by your health care provider. ? Leave stitches (sutures), skin glue, or adhesive strips in place. These skin closures may need to stay in place for 2 weeks or longer. If adhesive strip edges start to loosen and curl up, you may trim the loose edges. Do not remove adhesive strips completely unless your health care provider tells you to do that.  Check your incision area every day for signs of infection. Check  for: ? More redness, swelling, or pain. ? More fluid or blood. ? Warmth. ? Pus or a bad smell. Activity  Return to your normal activities as told by your health care provider. Ask your health care provider what activities are safe for you.  Do not lift anything that is heavier than 10 lb (4.5 kg) until your health care provider approves.  Do not lift your upper arms above your shoulders for at least 6 weeks or as long as told by your health care provider. ? If you sleep with your arms above your head, wear an arm restraint while you sleep to prevent this from happening. ? Avoid sudden movements that pull your upper arms far away from your body for at least 6 weeks.  Do a mild form of exercise at least once a day. As you feel better, you may exercise more.  Gently stretch your shoulders at least once a day to help prevent stiffness in your chest. Electricity and Magnetic Fields  Avoid places and objects that have a strong electric or magnetic field. This includes: ? Airport Data processing manager. When you are at the airport, tell officials that you have a pacemaker and show them your pacemaker identification card. Officials will check you in safely so that your pacemaker is not damaged. Do not allow magnetic wands to be waved near your pacemaker. That can make the pacemaker stop working. ? Metal detectors. If you must pass through a metal detector, walk through it quickly. Do not stop under the detector or stand near it. ?  Power plants. ? Large electrical generators. ? Radiofrequency transmission towers, such as cell phone and radio towers.  Do not use amateur ("ham") radio equipment or electric ("arc") Clinical cytogeneticist. If you are not sure whether something is safe to use, ask your health care provider. ? Some devices may be safe to use if you hold them at least 1 ft (0.3 m) from your pacemaker. These devices may include power tools, lawn mowers, and speakers.  When you talk on your cell  phone, hold it to your ear that is opposite from the side that your pacemaker is on. Do not leave your cell phone in a pocket over your pacemaker. Long-Term Care  Carry your pacemaker identification card with you at all times, especially when you travel.  Consider wearing a medical alert bracelet or necklace that explains your pacemaker and any heart conditions you have.  Tell all health care providers who care for you that you have a pacemaker. This may prevent you from having an MRI because of the strong magnets used during that test.  Have your pacemaker checked every 3-6 months or as often as told by your health care provider. General instructions  Do not use any tobacco products, such as cigarettes, chewing tobacco, or e-cigarettes. If you need help quitting, ask your health care provider.  Do not drive or operate heavy machinery while taking prescription pain medicine.  Do not take baths, swim, or use a hot tub until your health care provider approves.  Follow instructions from your health care provider about eating or drinking restrictions.  Weigh yourself every day and write down your weight.  Keep all follow-up visits as told by your health care provider. This is important. Contact a health care provider if:  You suddenly gain 3 lb (1.4 kg) or more in 24 hours.  You have swelling in your feet or legs.  You have an irregular heartbeat (palpitations).  You have more redness, swelling, or pain around your incision.  You have more fluid or blood coming from your incision.  Your incision area feels warm to the touch.  You have pus or a bad smell coming from your incision. Get help right away if:  You have chest pain.  You have difficulty breathing.  You suddenly feel light-headed.  You have a fever.  You faint. These symptoms may represent a serious problem that is an emergency. Do not wait to see if the symptoms will go away. Get medical help right away. Call  your local emergency services (911 in the [place] Do not drive yourself to the hospital. This information is not intended to replace advice given to you by your health care provider. Make sure you discuss any questions you have with your health care provider. Document Released: 11/24/2011 Document Revised: 08/07/2015 Document Reviewed: 11/24/2014 Elsevier Interactive Patient Education  [employer].

## 2017-12-03 LAB — CBC WITH DIFFERENTIAL/PLATELET
BASOS ABS: 39 {cells}/uL (ref 0–200)
Basophils Relative: 0.5 %
EOS ABS: 208 {cells}/uL (ref 15–500)
EOS PCT: 2.7 %
HEMATOCRIT: 44.7 % (ref 35.0–45.0)
Hemoglobin: 14.8 g/dL (ref 11.7–15.5)
Lymphs Abs: 1810 cells/uL (ref 850–3900)
MCH: 29.2 pg (ref 27.0–33.0)
MCHC: 33.1 g/dL (ref 32.0–36.0)
MCV: 88.3 fL (ref 80.0–100.0)
MONOS PCT: 9.5 %
MPV: 11.1 fL (ref 7.5–12.5)
NEUTROS PCT: 63.8 %
Neutro Abs: 4913 cells/uL (ref 1500–7800)
Platelets: 234 10*3/uL (ref 140–400)
RBC: 5.06 10*6/uL (ref 3.80–5.10)
RDW: 15 % (ref 11.0–15.0)
Total Lymphocyte: 23.5 %
WBC mixed population: 732 cells/uL (ref 200–950)
WBC: 7.7 10*3/uL (ref 3.8–10.8)

## 2017-12-03 LAB — PROTIME-INR
INR: 6.4 — AB
Prothrombin Time: 67 s — ABNORMAL HIGH (ref 9.0–11.5)

## 2017-12-05 ENCOUNTER — Ambulatory Visit (INDEPENDENT_AMBULATORY_CARE_PROVIDER_SITE_OTHER): Payer: Medicare Other | Admitting: Physician Assistant

## 2017-12-05 ENCOUNTER — Other Ambulatory Visit: Payer: Self-pay

## 2017-12-05 ENCOUNTER — Ambulatory Visit (INDEPENDENT_AMBULATORY_CARE_PROVIDER_SITE_OTHER): Payer: Self-pay

## 2017-12-05 ENCOUNTER — Other Ambulatory Visit: Payer: Medicare Other

## 2017-12-05 ENCOUNTER — Encounter (INDEPENDENT_AMBULATORY_CARE_PROVIDER_SITE_OTHER): Payer: Self-pay | Admitting: Physician Assistant

## 2017-12-05 DIAGNOSIS — M19172 Post-traumatic osteoarthritis, left ankle and foot: Secondary | ICD-10-CM

## 2017-12-05 DIAGNOSIS — M25572 Pain in left ankle and joints of left foot: Secondary | ICD-10-CM

## 2017-12-05 DIAGNOSIS — Z7901 Long term (current) use of anticoagulants: Secondary | ICD-10-CM

## 2017-12-05 MED ORDER — METHYLPREDNISOLONE 4 MG PO TABS: ORAL_TABLET | ORAL | 0 refills | Status: DC

## 2017-12-05 NOTE — Progress Notes (Signed)
  HPI: [name] returns today status post left ankle intra-articular injection.  Patient states she is having more pain in the ankle since the injection on 11/29/2016.  She feels she may have turned the ankle while walking in her backyard when there is a lot of nuts on the ground.  Physical exam: Left ankle she has tenderness over the talotibial ligament and medial malleolus.  Left calf supple nontender.  She has limited dorsiflexion plantarflexion of the ankle with discomfort.  Radiographs: 3 views left ankle talus well located within the ankle mortise no diastases.  No acute fracture.  Distal posttraumatic shaft changes.  Moderate arthritic changes left ankle.  Impression: Left ankle pain posttraumatic arthritic changes  Plan: Place her in a cam walker boot weightbearing as tolerated.  Was placed on Medrol Dosepak.  We will see her back in 2 weeks check her progress lack of.

## 2017-12-06 LAB — PROTIME-INR
INR: 5.8 — AB
PROTHROMBIN TIME: 60.2 s — AB (ref 9.0–11.5)

## 2017-12-08 ENCOUNTER — Ambulatory Visit: Payer: Self-pay

## 2017-12-08 ENCOUNTER — Encounter: Payer: Self-pay | Admitting: Physician Assistant

## 2017-12-08 ENCOUNTER — Ambulatory Visit (INDEPENDENT_AMBULATORY_CARE_PROVIDER_SITE_OTHER): Payer: Medicare Other | Admitting: Physician Assistant

## 2017-12-08 VITALS — BP 124/68 | HR 82 | Temp 98.0°F | Resp 16 | Ht 63.0 in | Wt 191.0 lb

## 2017-12-08 DIAGNOSIS — Z7901 Long term (current) use of anticoagulants: Secondary | ICD-10-CM | POA: Diagnosis not present

## 2017-12-08 DIAGNOSIS — M19172 Post-traumatic osteoarthritis, left ankle and foot: Secondary | ICD-10-CM

## 2017-12-08 LAB — PROTIME-INR
INR: 2 — ABNORMAL HIGH
PROTHROMBIN TIME: 20.7 s — AB (ref 9.0–11.5)

## 2017-12-08 MED ORDER — RIVAROXABAN 20 MG PO TABS: 20.0000 mg | ORAL_TABLET | Freq: Every day | ORAL | 5 refills | Status: DC

## 2017-12-08 NOTE — Progress Notes (Signed)
   Subjective:    Patient ID: Katie [name], female    DOB: 02/12/1941, 77 y.o.   MRN: [id number]  HPI 77 y.o. WF with history of has Migraine headache; Essential hypertension; Coronary atherosclerosis; Atrial fibrillation ([place]); Congestive heart failure ([place]); COPD GOLD 0 ; GERD; Osteopenia; Hypothyroidism; Vitamin D deficiency; Long term current use of anticoagulant therapy; CKD stage G3b/A1, GFR 30-44 and albumin creatinine ratio <30 mg/g ([place]); Hyperlipidemia; Medication management; Pulmonary Fibrosis sequellae of Amiodarone; PVD (peripheral vascular disease) with claudication ([place]); Morbid obesity ([place]); AAA (abdominal aortic aneurysm) without rupture ([place]); Other abnormal glucose; Gout; Esophageal dysphagia; PAH (pulmonary artery hypertension) ([place]); Chronic systolic CHF (congestive heart failure) ([place]); Depression; Bradycardia; Aortic atherosclerosis ([place]); Acute respiratory failure with hypoxia ([place]); Shortness of breath; Chronic systolic heart failure (H[facility]); and Post-traumatic arthritis of ankle, left on their problem list. presents with ankle pain and abnormal INR.   Her INR has been elevated, was 6.4 six days ago and then 5.8 three days ago, she has been off the coumadin x 6 days. She was on 2 mg 3x/week and 3mg  4xweek. Look back at her coumadin, it has not been within range as far back as Jan/2018. We discussed trying to get her assistance on xarelto and she is interested.   She also complains of left ankle pain, she has see ortho this AM, Erskine Emery PA. Ankle Xray showed OA. She was placed in Cam walker and weight bearing as tolerated with medrol dose pak, suppose to follow up 2 week.s. She has a hard time doing non weight bearing, walked in with a cane. She is on allopurinol, on prednsone pack and still having severe pain.   Lab Results  Component Value Date   LABURIC 4.8 07/05/2017     Review of Systems See HPI, no fever, chills.     Objective:   Physical Exam General  Appearance: Well nourished, in no acute distress  Eyes: PERRLA, EOMs, conjunctiva no swelling or erythema ENT/Mouth:  No erythema Neck: Supple, thyroid normal.  Respiratory: Respiratory effort increased, BS decreased throughout  without audible rales, rhonchi,  or stridor.  Cardio: Rhythm irregular, normal rate with 3/6 systolic murmur. Thready peripheral pulses upper and lower extremities with mild edema.  Abdomen: Soft, + BS.  Non tender, no guarding, rebound, palpable hernias or masses. Lymphatics: Non tender without lymphadenopathy.  Musculoskeletal: Full ROM, 5/5 strength, slow gait with cane, left ankle with tenderness at medial mallelous, left calf supple, no erythema, swelling, warmth.  Skin: Warm, very dry without rashes, lesions, ecchymosis.  Psych: Awake and oriented X 3, normal affect, Insight and Judgment appropriate.        Assessment & Plan:  Aunika was seen today for acute visit.  Diagnoses and all orders for this visit:  Post-traumatic arthritis of ankle, left -     DME Wheelchair manual - follow up 2 weeks ortho - emphasized need for non weight bearing Patient thinks she can get a scooter from a friend, suggest trying this with caution and doing more with wheelchair - mitigate samples given  Long term current use of anticoagulant therapy -     rivaroxaban (XARELTO) 20 MG TABS tablet; Take 1 tablet (20 mg total) by mouth daily with supper. -     Protime-INR - patient with VERY irregular coumadin with large swings, suggest trying xarelto, will work on patient assitance.   Very close follow up 1 week

## 2017-12-08 NOTE — Patient Instructions (Signed)
Try the mitigare 2 capsules a day for 3 days Then 1 capsule a day for ankle pain  When your INR is below 2 we will get you to start the xaretlo 20mg  Start 1 pill a day for 3 months and then we can switch to 1/2 pill  Follow up 1 week 30 mins

## 2017-12-09 ENCOUNTER — Telehealth: Payer: Self-pay

## 2017-12-09 NOTE — Telephone Encounter (Signed)
Pt aware.

## 2017-12-09 NOTE — Telephone Encounter (Signed)
-----   Message from Vicie Mutters, [place] sent at 12/09/2017 11:54 AM EDT ----- Regarding: RE: MED QUESTION Yes, start the mitigare, 2 x a day for 3 days then once a day [place]  ----- Message ----- From: Elenor Quinones, CMA Sent: 12/09/2017  11:29 AM EDT To: Vicie Mutters, PA-C Subject: MED QUESTION                                   Pt would like to know if she should start the [facility]

## 2017-12-13 ENCOUNTER — Ambulatory Visit: Payer: Medicare Other | Admitting: Internal Medicine

## 2017-12-14 NOTE — Progress Notes (Signed)
Subjective:    Patient ID: KATIE FARAONE, female    DOB: April 26, 1940, 77 y.o.   MRN: [id number]  HPI 77 y.o. WF with history of has Migraine headache; Essential hypertension; Coronary atherosclerosis; Atrial fibrillation ([place]); Congestive heart failure ([place]); COPD GOLD 0 ; GERD; Osteopenia; Hypothyroidism; Vitamin D deficiency; Long term current use of anticoagulant therapy; CKD stage G3b/A1, GFR 30-44 and albumin creatinine ratio <30 mg/g ([place]); Hyperlipidemia; Medication management; Pulmonary Fibrosis sequellae of Amiodarone; PVD (peripheral vascular disease) with claudication ([place]); Morbid obesity ([place]); AAA (abdominal aortic aneurysm) without rupture ([place]); Other abnormal glucose; Gout; Esophageal dysphagia; PAH (pulmonary artery hypertension) ([place]); Chronic systolic CHF (congestive heart failure) ([place]); Depression; Bradycardia; Aortic atherosclerosis ([place]); Acute respiratory failure with hypoxia ([place]); Shortness of breath; Chronic systolic heart failure (HCC); and Post-traumatic arthritis of ankle, left on their problem list. presents with ankle pain and abnormal INR, close follow up.  She was started on xarelto last visit 20mg  a day for abnormal INR and large swings in her INR. We will work on patient assistance at this time. If this does not work we will do a home health once a week service.  Lab Results  Component Value Date   INR 2.0 (H) 12/08/2017   INR 5.8 (H) 12/05/2017   INR 6.4 (H) 12/02/2017   She also complains of left ankle pain, she has see ortho this AM, Erskine Emery PA. Ankle Xray showed OA. We emphasized to try to not bear weight on it, added samples of mitgate with the prednisone pack to see if this helped. She has a follow up Monday. She is doing much better, she has been staying off her ankle, finished the prednisone, and is still doing the mitigate once a day. She has   Lab Results  Component Value Date   LABURIC 4.8 07/05/2017    Blood pressure 114/68, pulse 71,  temperature 98.1 F (36.7 C), resp. rate 16, height 5\' 3"  (1.6 m), weight 190 lb (86.2 kg), SpO2 95 %.   Review of Systems See HPI, no fever, chills.     Objective:   Physical Exam General Appearance: Well nourished, in no acute distress  Eyes: PERRLA, EOMs, conjunctiva no swelling or erythema ENT/Mouth:  No erythema Neck: Supple, thyroid normal.  Respiratory: Respiratory effort increased, BS decreased throughout  without audible rales, rhonchi,  or stridor.  Cardio: Rhythm irregular, normal rate with 3/6 systolic murmur. Thready peripheral pulses upper and lower extremities with mild edema.  Abdomen: Soft, + BS.  Non tender, no guarding, rebound, palpable hernias or masses. Lymphatics: Non tender without lymphadenopathy.  Musculoskeletal: Full ROM, 5/5 strength, in wheelchair, left ankle with tenderness at anterior ankle, left calf supple, no erythema, swelling, warmth.  Skin: Warm, very dry without rashes, lesions, ecchymosis.  Psych: Awake and oriented X 3, normal affect, Insight and Judgment appropriate.        Assessment & Plan:  [place] was seen today for acute visit.  Diagnoses and all orders for this visit:  Post-traumatic arthritis of ankle, left Doing better Continue non weight bearing through the weekend Follow up Monday with ortho Stop mitigate samples Sunday and see how she does  Long term current use of anticoagulant therapy -     rivaroxaban (XARELTO) 20 MG TABS tablet; Take 1 tablet (20 mg total) by mouth daily with supper. - patient with VERY irregular coumadin with large swings, suggest trying xarelto, will work on patient assitance.  - if this does not work with patient assistance  we will start her back on coumadin and do at home coumadin checks q weekly   Future Appointments  Date Time Provider [place]  12/19/2017  9:30 AM Pete Pelt, PA-C [facility] None  01/06/2018 11:15 AM [name], MD [facility] None  02/02/2018  9:00 AM [name], MD [facility] None  02/13/2018 10:00 AM Evans Lance, MD [facility]  08/23/2018  2:00 PM Liane Comber, NP [facility] None

## 2017-12-16 ENCOUNTER — Encounter: Payer: Self-pay | Admitting: Physician Assistant

## 2017-12-16 ENCOUNTER — Ambulatory Visit (INDEPENDENT_AMBULATORY_CARE_PROVIDER_SITE_OTHER): Payer: Medicare Other | Admitting: Physician Assistant

## 2017-12-16 VITALS — BP 114/68 | HR 71 | Temp 98.1°F | Resp 16 | Ht 63.0 in | Wt 190.0 lb

## 2017-12-16 DIAGNOSIS — I4821 Permanent atrial fibrillation: Secondary | ICD-10-CM

## 2017-12-16 DIAGNOSIS — M19172 Post-traumatic osteoarthritis, left ankle and foot: Secondary | ICD-10-CM | POA: Diagnosis not present

## 2017-12-16 NOTE — Patient Instructions (Addendum)
If you are interested in cutting back on your medications this is what we can do  Talk with your heart doctor about the imdur but continue it for now  When you are done with your pravastatin stop it and start on crestor 20mg  every other day  Start taking the pantoprazole every other day BUT if you get heart burn or start coughing or get more short of breath than take every day again    Try to stop the mitigate this Sunday and see how you do- if you have to restart it we can do every other day.   Silent reflux: Not all heartburn burns...Marland KitchenMarland KitchenMarland Kitchen  What is LPR? Laryngopharyngeal reflux (LPR) or silent reflux is a condition in which acid that is made in the stomach travels up the esophagus (swallowing tube) and gets to the throat. Not everyone with reflux has a lot of heartburn or indigestion. In fact, many people with LPR never have heartburn. This is why LPR is called SILENT REFLUX, and the terms "Silent reflux" and "LPR" are often used interchangeably. Because LPR is silent, it is sometimes difficult to diagnose.  How can you tell if you have LPR?  Marland Kitchen Chronic hoarseness- Some people have hoarseness that comes and goes . throat clearing  . Cough . It can cause shortness of breath and cause asthma like symptoms. Marland Kitchen a feeling of a lump in the throat  . difficulty swallowing . a problem with too much nose and throat drainage.  . Some people will feel their esophagus spasm which feels like their heart beating hard and fast, this will usually be after a meal, at rest, or lying down at night.    How do I treat this? Treatment for LPR should be individualized, and your doctor will suggest the best treatment for you. Generally there are several treatments for LPR: . changing habits and diet to reduce reflux,  . medications to reduce stomach acid, and  . surgery to prevent reflux. Most people with LPR need to modify how and when they eat, as well as take some medication, to get well. Sometimes,  nonprescription liquid antacids, such as Maalox, Gelucil and Mylanta are recommended. When used, these antacids should be taken four times each day - one tablespoon one hour after each meal and before bedtime. Dietary and lifestyle changes alone are not often enough to control LPR - medications that reduce stomach acid are also usually needed. These must be prescribed by our doctor.   TIPS FOR REDUCING REFLUX AND LPR Control your LIFE-STYLE and your DIET! Marland Kitchen If you use tobacco, QUIT.  Marland Kitchen Smoking makes you reflux. After every cigarette you have some LPR.  . Don't wear clothing that is too tight, especially around the waist (trousers, corsets, belts).  . Do not lie down just after eating...in fact, do not eat within three hours of bedtime.  . You should be on a low-fat diet.  . Limit your intake of red meat.  . Limit your intake of butter.  Marland Kitchen Avoid fried foods.  . Avoid chocolate  . Avoid cheese.  Marland Kitchen Avoid eggs. Marland Kitchen Specifically avoid caffeine (especially coffee and tea), soda pop (especially cola) and mints.  . Avoid alcoholic beverages, particularly in the evening.

## 2017-12-19 ENCOUNTER — Ambulatory Visit (INDEPENDENT_AMBULATORY_CARE_PROVIDER_SITE_OTHER): Payer: Medicare Other | Admitting: Physician Assistant

## 2017-12-19 ENCOUNTER — Encounter (INDEPENDENT_AMBULATORY_CARE_PROVIDER_SITE_OTHER): Payer: Self-pay | Admitting: Physician Assistant

## 2017-12-19 VITALS — Ht 63.0 in | Wt 190.0 lb

## 2017-12-19 DIAGNOSIS — M19072 Primary osteoarthritis, left ankle and foot: Secondary | ICD-10-CM

## 2017-12-19 DIAGNOSIS — M19172 Post-traumatic osteoarthritis, left ankle and foot: Secondary | ICD-10-CM

## 2017-12-19 NOTE — Progress Notes (Signed)
HPI: Ms. Brossart returns today complaining of left ankle pain.  She states while she was on the Medrol Dosepak the pain did dissipate.  However pain is back wearing an ASO brace.  She states that she was unable to wear the cam walker boot that is too heavy.  States she saw her primary care physician who placed her on an NSAID and this caused diarrhea and therefore she is unable to take this.  Most the weekend she is been sitting around with her foot elevated states she is having difficulty getting around.  Physical exam: General well-developed well-nourished female no acute distress well. Psych: Alert and oriented x3  Left ankle she has tenderness of the anterior medial joint line of the ankle.  Overall range of motion the ankle is improved and she has good dorsiflexion plantarflexion.  No rashes skin lesions ulcerations.  Calf supple nontender.  Impression: Left ankle posttraumatic arthritis  Plan: We will obtain a CT scan to evaluate for osteochondral lesion talus.  Could not obtain an MRI due to her recent placement of defibrillator 8 weeks ago.  See her back after the CT scan to discuss further treatment.  She is activities as tolerated an ASO brace.

## 2017-12-27 ENCOUNTER — Ambulatory Visit
Admission: RE | Admit: 2017-12-27 | Discharge: 2017-12-27 | Disposition: A | Discharge status: Home / Self Care | Payer: Medicare Other | Source: Ambulatory Visit | Attending: Physician Assistant | Admitting: Physician Assistant

## 2017-12-27 DIAGNOSIS — M19172 Post-traumatic osteoarthritis, left ankle and foot: Secondary | ICD-10-CM

## 2017-12-27 DIAGNOSIS — M19072 Primary osteoarthritis, left ankle and foot: Secondary | ICD-10-CM | POA: Diagnosis not present

## 2018-01-02 ENCOUNTER — Ambulatory Visit (INDEPENDENT_AMBULATORY_CARE_PROVIDER_SITE_OTHER): Payer: Medicare Other | Admitting: Physician Assistant

## 2018-01-02 ENCOUNTER — Encounter (INDEPENDENT_AMBULATORY_CARE_PROVIDER_SITE_OTHER): Payer: Self-pay | Admitting: Physician Assistant

## 2018-01-02 VITALS — Ht 63.0 in | Wt 190.0 lb

## 2018-01-02 DIAGNOSIS — M19172 Post-traumatic osteoarthritis, left ankle and foot: Secondary | ICD-10-CM

## 2018-01-02 NOTE — Progress Notes (Signed)
HP I: Ms. Mealor returns today to go to CT scan of her left ankle.  States that her ankle overall is feeling a little bit better but she has pain still with every step.  She is tried an ASO brace which really has not helped.  She does feel that last cortisone injection did help with her range of motion just does not take away all of her pain as it has in the past.  She has had no new injury to the ankle. 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  Physical exam: General well-developed well-nourished female in no acute distress. Psych alert and oriented x3 Left lower extremity: Calf supple nontender.  Tenderness over the anterior medial ankle.  Nontender over the medial lateral malleoli no tenderness over the distal fibular diaphysis.  Impression: Posttraumatic osteoarthritis left ankle  Plan: We will send patient to Dr. Lucia Gaskins for second opinion on treatment of the left ankle.  Patient would like to exhaust conservative treatments prior to having any type of surgery.  Discussed with her that we could try another cortisone injection but would not recommend another injection until December, we could try  other forms of bracing, arthroscopic debridement.  However she may ultimately benefit from a left ankle fusion versus left ankle replacement.  Questions were encouraged and answered at length.  Follow-up as needed

## 2018-01-05 ENCOUNTER — Other Ambulatory Visit: Payer: Self-pay | Admitting: Adult Health Nurse Practitioner

## 2018-01-05 ENCOUNTER — Encounter: Payer: Self-pay | Admitting: Adult Health Nurse Practitioner

## 2018-01-05 ENCOUNTER — Ambulatory Visit (INDEPENDENT_AMBULATORY_CARE_PROVIDER_SITE_OTHER): Payer: Medicare Other | Admitting: Adult Health Nurse Practitioner

## 2018-01-05 VITALS — BP 122/72 | HR 71 | Temp 97.3°F | Ht 63.0 in | Wt 192.0 lb

## 2018-01-05 DIAGNOSIS — Z7901 Long term (current) use of anticoagulants: Secondary | ICD-10-CM

## 2018-01-05 DIAGNOSIS — I2510 Atherosclerotic heart disease of native coronary artery without angina pectoris: Secondary | ICD-10-CM | POA: Diagnosis not present

## 2018-01-05 DIAGNOSIS — I5042 Chronic combined systolic (congestive) and diastolic (congestive) heart failure: Secondary | ICD-10-CM | POA: Diagnosis not present

## 2018-01-05 DIAGNOSIS — M109 Gout, unspecified: Secondary | ICD-10-CM

## 2018-01-05 DIAGNOSIS — M19172 Post-traumatic osteoarthritis, left ankle and foot: Secondary | ICD-10-CM

## 2018-01-05 DIAGNOSIS — E559 Vitamin D deficiency, unspecified: Secondary | ICD-10-CM | POA: Diagnosis not present

## 2018-01-05 DIAGNOSIS — I10 Essential (primary) hypertension: Secondary | ICD-10-CM

## 2018-01-05 DIAGNOSIS — Z79899 Other long term (current) drug therapy: Secondary | ICD-10-CM

## 2018-01-05 DIAGNOSIS — K219 Gastro-esophageal reflux disease without esophagitis: Secondary | ICD-10-CM

## 2018-01-05 DIAGNOSIS — E039 Hypothyroidism, unspecified: Secondary | ICD-10-CM

## 2018-01-05 DIAGNOSIS — I4821 Permanent atrial fibrillation: Secondary | ICD-10-CM

## 2018-01-05 DIAGNOSIS — E782 Mixed hyperlipidemia: Secondary | ICD-10-CM | POA: Diagnosis not present

## 2018-01-05 DIAGNOSIS — N183 Chronic kidney disease, stage 3 unspecified: Secondary | ICD-10-CM

## 2018-01-05 DIAGNOSIS — R7309 Other abnormal glucose: Secondary | ICD-10-CM | POA: Diagnosis not present

## 2018-01-05 MED ORDER — DIGOXIN 125 MCG PO TABS: 0.1250 mg | ORAL_TABLET | Freq: Every day | ORAL | 1 refills | Status: DC

## 2018-01-05 NOTE — Patient Instructions (Addendum)
Do the following things EVERYDAY: 1) Weigh yourself in the morning before breakfast or at the same time every day. Write it down and keep it in a log. 2) Take your medicines as prescribed 3) Eat low salt foods-Limit salt (sodium) to 2000 mg per day. Best thing to do is avoid processed foods.   4) Stay as active as you can everyday 5) Limit all fluids for the day to less than 1.5 liters  Call your doctor if:  Anytime you have any of the following symptoms:  1) 2 pound weight gain in 24 hours or 5 pounds in 1 week  2) shortness of breath, with or without a dry hacking cough  3) swelling in the hands, LEGs, feet or stomach  4) if you have to sleep on extra pillows at night in order to breathe. 5) after laying down at night for 20-30 mins, you wake up short of breath.   These can all be signs of fluid overload.    Heart Failure Heart failure means your heart has trouble pumping blood. This makes it hard for your body to work well. Heart failure is usually a long-term (chronic) condition. You must take good care of yourself and follow your doctor's treatment plan. Follow these instructions at home:  Take your heart medicine as told by your doctor. ? Do not stop taking medicine unless your doctor tells you to. ? Do not skip any dose of medicine. ? Refill your medicines before they run out. ? Take other medicines only as told by your doctor or pharmacist.  Stay active if told by your doctor. The elderly and people with severe heart failure should talk with a doctor about physical activity.  Eat heart-healthy foods. Choose foods that are without trans fat and are low in saturated fat, cholesterol, and salt (sodium). This includes fresh or frozen fruits and vegetables, fish, lean meats, fat-free or low-fat dairy foods, whole grains, and high-fiber foods. Lentils and dried peas and beans (legumes) are also good choices.  Limit salt if told by your doctor.  Cook in a healthy way. Roast,  grill, broil, bake, poach, steam, or stir-fry foods.  Limit fluids as told by your doctor.  Weigh yourself every morning. Do this after you pee (urinate) and before you eat breakfast. Write down your weight to give to your doctor.  Take your blood pressure and write it down if your doctor tells you to.  Ask your doctor how to check your pulse. Check your pulse as told.  Lose weight if told by your doctor.  Stop smoking or chewing tobacco. Do not use gum or patches that help you quit without your doctor's approval.  Schedule and go to doctor visits as told.  Nonpregnant women should have no more than 1 drink a day. Men should have no more than 2 drinks a day. Talk to your doctor about drinking alcohol.  Stop illegal drug use.  Stay current with shots (immunizations).  Manage your health conditions as told by your doctor.  Learn to manage your stress.  Rest when you are tired.  If it is really hot outside: ? Avoid intense activities. ? Use air conditioning or fans, or get in a cooler place. ? Avoid caffeine and alcohol. ? Wear loose-fitting, lightweight, and light-colored clothing.  If it is really cold outside: ? Avoid intense activities. ? Layer your clothing. ? Wear mittens or gloves, a hat, and a scarf when going outside. ? Avoid alcohol.  Learn about   heart failure and get support as needed.  Get help to maintain or improve your quality of life and your ability to care for yourself as needed. Contact a doctor if:  You gain weight quickly.  You are more short of breath than usual.  You cannot do your normal activities.  You tire easily.  You cough more than normal, especially with activity.  You have any or more puffiness (swelling) in areas such as your hands, feet, ankles, or belly (abdomen).  You cannot sleep because it is hard to breathe.  You feel like your heart is beating fast (palpitations).  You get dizzy or light-headed when you stand up. Get  help right away if:  You have trouble breathing.  There is a change in mental status, such as becoming less alert or not being able to focus.  You have chest pain or discomfort.  You faint. This information is not intended to replace advice given to you by your health care provider. Make sure you discuss any questions you have with your health care provider. Document Released: 12/09/2007 Document Revised: 08/07/2015 Document Reviewed: 04/17/2012 Elsevier Interactive Patient Education  2017 [place].   Bleeding Precautions When on Anticoagulant Therapy WHAT IS ANTICOAGULANT THERAPY? Anticoagulant therapy is taking medicine to prevent or reduce blood clots. It is also called blood thinner therapy. Blood clots that form in your blood vessels can be dangerous. They can break loose and travel to your heart, lungs, or brain. This increases your risk of a heart attack or stroke. Anticoagulant therapy causes blood to clot more slowly. You may need anticoagulant therapy if you have:  A medical condition that increases the likelihood that blood clots will form.  A heart defect or a problem with heart rhythm. It is also a common treatment after heart surgery, such as valve replacement. WHAT ARE COMMON TYPES OF ANTICOAGULANT THERAPY? Anticoagulant medicine can be injected or taken by mouth.If you need anticoagulant therapy quickly at the hospital, the medicine may be injected under your skin or given through an IV tube. Heparin is a common example of an anticoagulant that you may get at the hospital. Most anticoagulant therapy is in the form of pills that you take at home every day. These may include:  Aspirin. This common blood thinner works by preventing blood cells (platelets) from sticking together to form a clot. Aspirin is not as strong as anticoagulants that slow down the time that it takes for your body to form a clot.  Clopidogrel. This is a newer type of drug that affects platelets.  It is stronger than aspirin.  Warfarin. This is the most common anticoagulant. It changes the way your body uses vitamin K, a vitamin that helps your blood to clot. The risk of bleeding is higher with warfarin than with aspirin. You will need frequent blood tests to make sure you are taking the safest amount.  New anticoagulants. Several new drugs have been approved. They are all taken by mouth. Studies show that these drugs work as well as warfarin. They do not require blood testing. They may cause less bleeding risk than warfarin. WHAT DO I NEED TO REMEMBER WHEN TAKING ANTICOAGULANT THERAPY? Anticoagulant therapy decreases your risk of forming a blood clot, but it increases your risk of bleeding. Work closely with your health care provider to make sure you are taking your medicine safely. These tips can help:  Learn ways to reduce your risk of bleeding.  If you are taking warfarin: ? Have  blood tests as ordered by your health care provider. ? Do not make any sudden changes to your diet. Vitamin K in your diet can make warfarin less effective. ? Do not get pregnant. This medicine may cause birth defects.  Take your medicine at the same time every day. If you forget to take your medicine, take it as soon as you remember. If you miss a whole day, do not double your dose of medicine. Take your normal dose and call your health care provider to check in.  Do not stop taking your medicine on your own.  Tell your health care provider before you start taking any new medicine, vitamin, or herbal product. Some of these could interfere with your therapy.  Tell all of your health care providers that you are on anticoagulant therapy.  Do not have surgery, medical procedures, or dental work until you tell your health care provider that you are on anticoagulant therapy. WHAT CAN AFFECT HOW ANTICOAGULANTS WORK? Certain foods, vitamins, medicines, supplements, and herbal medicines change the way that  anticoagulant therapy works. They may increase or decrease the effects of your anticoagulant therapy. Either result can be dangerous for you.  Many over-the-counter medicines for pain, colds, or stomach problems interfere with anticoagulant therapy. Take these only as told by your health care provider.  Do not drink alcohol. It can interfere with your medicine and increase your risk of an injury that causes bleeding.  If you are taking warfarin, do not begin eating more foods that contain vitamin K. These include leafy green vegetables. Ask your health care provider if you should avoid any foods. WHAT ARE SOME WAYS TO PREVENT BLEEDING? You can prevent bleeding by taking certain precautions:  Be extra careful when you use knives, scissors, or other sharp objects.  Use an electric razor instead of a blade.  Do not use toothpicks.  Use a soft toothbrush.  Wear shoes that have nonskid soles.  Use bath mats and handrails in your bathroom.  Wear gloves while you do yard work.  Wear a helmet when you ride a bike.  Wear your seat belt.  Prevent falls by removing loose rugs and extension cords from areas where you walk.  Do not play contact sports or participate in other activities that have a high risk of injury. [place] PROVIDER? Call your health care provider if:  You miss a dose of medicine: ? And you are not sure what to do. ? For more than one day.  You have: ? Menstrual bleeding that is heavier than normal. ? Blood in your urine. ? A bloody nose or bleeding gums. ? Easy bruising. ? Blood in your stool (feces) or have black and tarry stool. ? Side effects from your medicine.  You feel weak or dizzy.  You become pregnant. Seek immediate medical care if:  You have bleeding that will not stop.  You have sudden and severe headache or belly pain.  You vomit or you cough up bright red blood.  You have a severe blow to your head. WHAT ARE  SOME QUESTIONS TO ASK MY HEALTH CARE PROVIDER?  What is the best anticoagulant therapy for my condition?  What side effects should I watch for?  When should I take my medicine? What should I do if I forget to take it?  Will I need to have regular blood tests?  Do I need to change my diet? Are there foods or drinks that I should  avoid?  What activities are safe for me?  What should I do if I want to get pregnant? This information is not intended to replace advice given to you by your health care provider. Make sure you discuss any questions you have with your health care provider. Document Released: 02/10/2015 Document Reviewed: 02/10/2015 Elsevier Interactive Patient Education  2017 [employer].

## 2018-01-05 NOTE — Progress Notes (Signed)
FOLLOW UP 3 Month  Assessment and Plan:   Diagnoses and all orders for this visit:  Permanent atrial fibrillation Taking Xeralto20mg  daily  Discussed if patient falls to immediately contact office or go to ER.   Patient understands to call the office before starting a new medication. Follow up in 3 months  Atherosclerosis of native coronary artery of native heart without angina pectoris Control blood pressure, cholesterol, glucose, increase exercise.   Chronic combined systolic and diastolic congestive heart failure (HCC) Disease process and medications discussed. Questions answered fully. Emphasized salt restriction, less than 2000mg  a day. Encouraged daily monitoring of the patient's weight, call office if 5 lb weight loss or gain in a day.  Encouraged regular exercise. If any increasing shortness of breath, swelling, or chest pressure go to ER immediately.  decrease your fluid intake to less than 2 L daily please remember to always increase your potassium intake with any increase of your fluid pill.   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  Hypertension Well controlled with current medications  Follows with Cardiology Monitor blood pressure at home; patient to call if consistently greater than 130/80 Continue DASH diet.   Reminder to go to the ER if any CP, SOB, nausea, dizziness, severe HA, changes vision/speech, left arm numbness and tingling and jaw pain.  Cholesterol Currently at goal; continue current regiment Continue low cholesterol diet and exercise.  Check lipid panel.   Morbid obesity ([place]) Long discussion about weight loss, diet, and exercise Recommended diet heavy in fruits and veggies and low in animal meats, cheeses, and dairy products, appropriate calorie intake She will work on slowly increasing  exercise, cut down on portions/snacks Discussed appropriate weight for height and initial weight goal (185 lb) Follow up at next visit  Vitamin D Def At goal at last visit; continue supplementation to maintain goal of 70-100 Defer Vit D level  Gastroesophageal reflux disease without esophagitis Doing well on current regiment  Post-traumatic arthritis of ankle, left Wearing brace Ambulating with cane Following with Ortho for this Follow up in one day for surgical evaluation  Hypothyroidism, unspecified type Taking Levothyroxine 3mcg daily in am on empty stomach  Gout: Taking Allopurinol for this Has not had any acute attacks Will check Uric Acid  Continue diet and meds as discussed. Further disposition pending results of labs. Discussed med's effects and SE's.   Over 30 minutes of exam, counseling, chart review, and critical decision making was performed.   Future Appointments  Date Time Provider [place]  02/02/2018  9:00 AM [name], MD [facility] None  02/13/2018 10:00 AM Evans Lance, MD [facility]  08/23/2018  2:00 PM Liane Comber, NP [facility] None    ----------------------------------------------------------------------------------------------------------------------  HPI 77 y.o. female  presents for 3 month follow up on hypertension, cholesterol, diabetes, weight and vitamin D deficiency.  She has recently switched to from Coumadin to [place] one month ago and reports she is doing well with this.  She has applied for financial assistance with this medication for affordability.  She is S/P defibulator in 10/2017, she is doing well and had improvements with her breathing.  She no longer needs Aclindinium Bromide and has not had to use the recuse albuterol inhaler.  Report this is in date and she keeps it with her for PRN use.  She is to follow up with  cardiology Dr Lovena Le in two months.  She has an appointment with Dr Melvyn Novas after the  first of the year regarding previous fluid build up, likely cardiac in nature.    She is having left ankle pain with arthritis from  Crushing injury.  She has had injections that have mildly helped.  She is going to speak with Dr Lucia Gaskins with [facility] regarding evaluation of surgery.  She reports that she is unsure if she wants to have this done.  She is wearing a supportive brace and cane to assist with ambulation.   She received her flu vaccination last month.  BMI is Body mass index is 34.01 kg/m., she has not been working on diet and exercise related to ankle pain and impaired mobility. Wt Readings from Last 3 Encounters:  01/05/18 192 lb (87.1 kg)  01/02/18 190 lb (86.2 kg)  12/19/17 190 lb (86.2 kg)    Her blood pressure has been controlled at home, today their BP is BP: 122/72  She does not workout. She denies chest pain, shortness of breath, dizziness.   She is on cholesterol medication Pravastatin and denies myalgias. Her cholesterol is not at goal. The cholesterol last visit was:   Lab Results  Component Value Date   CHOL 172 09/29/2017   HDL 43 (L) 09/29/2017   LDLCALC 103 (H) 09/29/2017   TRIG 159 (H) 09/29/2017   CHOLHDL 4.0 09/29/2017   CHADS2 score 5, on xarleto.   She has not been working on diet and exercise for prediabetes, and denies hyperglycemia. Last A1C in the office was:  Lab Results  Component Value Date   HGBA1C 6.0 (H) 09/29/2017   Patient is on Vitamin D supplement.   Lab Results  Component Value Date   VD25OH 71 09/29/2017      Results for ADELI, FROST "SUE" (MRN [id number]) as of 01/05/2018 11:59  Ref. Range 06/21/2017 11:16 09/29/2017 09:50 11/08/2017 10:04  TSH Latest Ref Range: 0.40 - 4.50 mIU/L 4.00 1.72 10.86 (H)   Will check TSH today   Current Medications:  Current Outpatient Medications on File Prior to Visit  Medication Sig  . acetaminophen (TYLENOL) 500 MG tablet Take 1,000 mg by mouth every 6 (six) hours as needed for  moderate pain.   Marland Kitchen albuterol (PROAIR HFA) 108 (90 Base) MCG/ACT inhaler 2 inhalations 10-15 minutes apart evert 4 hours as needed for Asthma Flare (Patient taking differently: Inhale 2 puffs into the lungs every 4 (four) hours as needed for shortness of breath (asthma flare). )  . aspirin EC 81 MG tablet Take 81 mg by mouth daily.  . cetirizine (ZYRTEC) 10 MG tablet Take 10 mg by mouth daily.  . Cholecalciferol (VITAMIN D3) 5000 units CAPS Take 5,000 Units by mouth daily.   . digoxin (LANOXIN) 0.125 MG tablet Take 0.125 mg by mouth daily.  . Eyelid Cleansers (AVENOVA) 0.01 % SOLN APPLY A SMALL AMOUNT TO SKIN TWICE A DAY AS DIRECTED  . gabapentin (NEURONTIN) 600 MG tablet Take 0.5 tablets (300 mg total) by mouth 2 (two) times daily.  Marland Kitchen guaiFENesin (MUCINEX PO) Take 1 tablet by mouth daily as needed (runny nose).  Marland Kitchen ipratropium (ATROVENT) 0.06 % nasal spray Use 1 to 2 sprays each Nostril 2 to 3 x / day as needed  . isosorbide mononitrate (IMDUR) 30 MG 24 hr tablet Take 1 tablet daily for BP & Heart (Patient taking differently: Take 30 mg by mouth daily. )  . levothyroxine (SYNTHROID, LEVOTHROID) 50 MCG  tablet Take 1 tablet daily on an empty stomach with only water for 30 minutes.  . metoprolol succinate (TOPROL-XL) 50 MG 24 hr tablet Take 1 tablet (50 mg total) by mouth daily. Take with or immediately following a meal.  . Olopatadine HCl 0.2 % SOLN Place 1 drop into both eyes daily.   . pantoprazole (PROTONIX) 40 MG tablet Take 1 tablet (40 mg total) by mouth daily.  [name] Glycol-Propyl Glycol (SYSTANE OP) Place 1 drop into both eyes 4 (four) times daily as needed (dry eyes).   . potassium chloride SA (KLOR-CON M20) 20 MEQ tablet TAKE 2 TABLETS (40 MEQ TOTAL) BY MOUTH DAILY.  . pravastatin (PRAVACHOL) 40 MG tablet Take 40 mg by mouth daily.  . prednisoLONE acetate (PRED FORTE) 1 % ophthalmic suspension Place 1 drop into both eyes 2 (two) times daily.   . rivaroxaban (XARELTO) 20 MG TABS tablet  Take 1 tablet (20 mg total) by mouth daily with supper.  . torsemide (DEMADEX) 20 MG tablet Take 4 tablets (80 mg total) by mouth daily.  Marland Kitchen triamcinolone cream (KENALOG) 0.1 % Apply 1 application topically daily.   Marland Kitchen allopurinol (ZYLOPRIM) 300 MG tablet Take 300 mg by mouth daily.  . montelukast (SINGULAIR) 10 MG tablet Take 10 mg by mouth at bedtime.  . prochlorperazine (COMPAZINE) 5 MG tablet TAKE 1 TABLET BY MOUTH THREE TIMES A DAY AS NEEDED FOR VERTIGO OR NAUSEA (Patient taking differently: Take 5 mg by mouth daily as needed for nausea (vertigo). )   No current facility-administered medications on file prior to visit.      Allergies:  Allergies  Allergen Reactions  . Amiodarone Other (See Comments)    PULMONARY TOXICITY  . Diovan [Valsartan] Other (See Comments)    HYPOTENSION  . Doxycycline Diarrhea and Other (See Comments)    VISUAL DISTURBANCE  . Flexeril [Cyclobenzaprine] Other (See Comments)    FATIGUE  . Keflex [Cephalexin] Diarrhea  . Verapamil Other (See Comments)    EDEMA  . Codeine Hives     Medical History:  Past Medical History:  Diagnosis Date  . AAA (abdominal aortic aneurysm) ([place])   . AICD (automatic cardioverter/defibrillator) present 11/10/2017  . Arthritis    "some in my knees" (11/10/2017)  . Atrial fibrillation ([place])   . CHF (congestive heart failure) ([place])   . Chronic bronchitis ([place])   . COPD (chronic obstructive pulmonary disease) ([place])    'they say I don't; have a problem breathing though" (11/10/2017)  . Depression   . GERD (gastroesophageal reflux disease)   . Gout    "daily RX" (11/10/2017)  . History of hiatal hernia   . Hyperlipidemia   . Hypertension   . Hypothyroid   . Migraine headache    "hx; none since 1980s" (11/10/2017)  . Osteopenia   . Pneumonia    "~ 3 times" (11/10/2017)  . PVD (peripheral vascular disease) ([place])    Family history- Reviewed and unchanged Social history- Reviewed and unchanged   Review of Systems:   Review of Systems  Constitutional: Negative for chills, diaphoresis, fever, malaise/fatigue and weight loss.  HENT: Negative for congestion, ear discharge, ear pain, hearing loss, nosebleeds, sinus pain, sore throat and tinnitus.        Endorses ear fullness to left and increase in sinus drainage.  Eyes: Negative for blurred vision, double vision, pain, discharge and redness.  Respiratory: Negative for cough, hemoptysis, sputum production, shortness of breath and wheezing.   Cardiovascular: Positive for leg swelling.  Negative for chest pain, palpitations, orthopnea and claudication.       Mild but resolves over night.  Gastrointestinal: Negative for abdominal pain, blood in stool, constipation, diarrhea, heartburn, melena, nausea and vomiting.  Genitourinary: Negative for dysuria, flank pain, frequency, hematuria and urgency.  Musculoskeletal: Positive for joint pain. Negative for back pain, falls, myalgias and neck pain.       Left ankle pain  Skin: Positive for itching and rash.       Top of left foot  Neurological: Negative for dizziness, tingling, tremors, seizures, loss of consciousness, weakness and headaches.  Endo/Heme/Allergies: Positive for environmental allergies. Negative for polydipsia. Does not bruise/bleed easily.  Psychiatric/Behavioral: Negative for depression, memory loss and suicidal ideas. The patient is not nervous/anxious and does not have insomnia.       Physical Exam: BP 122/72   Pulse 71   Temp (!) 97.3 F (36.3 C)   Ht 5\' 3"  (1.6 m)   Wt 192 lb (87.1 kg)   SpO2 97%   BMI 34.01 kg/m  Wt Readings from Last 3 Encounters:  01/05/18 192 lb (87.1 kg)  01/02/18 190 lb (86.2 kg)  12/19/17 190 lb (86.2 kg)   General Appearance: Well nourished, in no apparent distress. Eyes: PERRLA, EOMs, conjunctiva no swelling or erythema Sinuses: No Frontal/maxillary tenderness ENT/Mouth: Ext aud canals clear, TMs without erythema, bulging. Right TM, serous noted behind TM.  No erythema, swelling, or exudate on post pharynx.  Tonsils not swollen or erythematous. Hearing normal.  Neck: Supple, thyroid normal.  Respiratory: Respiratory effort normal, BS equal bilaterally without rales, rhonchi, wheezing or stridor.  Cardio: RRR, 4/6 systolic murmur noted. Brisk peripheral pulses +1 edema bilaterally.  Abdomen: Soft, + BS.  Non tender, no guarding, rebound, hernias, masses. Lymphatics: Non tender without lymphadenopathy.  Musculoskeletal: Full ROM, 5/5 strength, Antalgic gait. Skin: Warm, dry without rashes, lesions, ecchymosis.  Neuro: Cranial nerves intact. No cerebellar symptoms.  Psych: Awake and oriented X 3, normal affect, Insight and Judgment appropriate.    Garnet Sierras, NP 12:30 PM [facility]

## 2018-01-06 ENCOUNTER — Other Ambulatory Visit: Payer: Self-pay | Admitting: Adult Health Nurse Practitioner

## 2018-01-06 ENCOUNTER — Ambulatory Visit: Payer: Self-pay | Admitting: Adult Health Nurse Practitioner

## 2018-01-06 ENCOUNTER — Encounter: Payer: Self-pay | Admitting: Adult Health Nurse Practitioner

## 2018-01-06 ENCOUNTER — Ambulatory Visit: Payer: Self-pay | Admitting: Internal Medicine

## 2018-01-06 DIAGNOSIS — M25572 Pain in left ankle and joints of left foot: Secondary | ICD-10-CM | POA: Diagnosis not present

## 2018-01-06 LAB — CBC WITH DIFFERENTIAL/PLATELET
Basophils Absolute: 41 {cells}/uL (ref 0–200)
Basophils Relative: 0.5 %
Eosinophils Absolute: 251 {cells}/uL (ref 15–500)
Eosinophils Relative: 3.1 %
HCT: 45.7 % — ABNORMAL HIGH (ref 35.0–45.0)
Hemoglobin: 15.3 g/dL (ref 11.7–15.5)
Lymphs Abs: 1733 cells/uL (ref 850–3900)
MCH: 30.1 pg (ref 27.0–33.0)
MCHC: 33.5 g/dL (ref 32.0–36.0)
MCV: 89.8 fL (ref 80.0–100.0)
MPV: 11.4 fL (ref 7.5–12.5)
Monocytes Relative: 10.3 %
Neutro Abs: 5241 {cells}/uL (ref 1500–7800)
Neutrophils Relative %: 64.7 %
Platelets: 190 Thousand/uL (ref 140–400)
RBC: 5.09 10*6/uL (ref 3.80–5.10)
RDW: 15.4 % — ABNORMAL HIGH (ref 11.0–15.0)
Total Lymphocyte: 21.4 %
WBC mixed population: 834 cells/uL (ref 200–950)
WBC: 8.1 10*3/uL (ref 3.8–10.8)

## 2018-01-06 LAB — LIPID PANEL
Cholesterol: 153 mg/dL (ref <200)
HDL: 33 mg/dL — ABNORMAL LOW (ref >50)
LDL Cholesterol (Calc): 94 mg/dL (calc)
Non-HDL Cholesterol (Calc): 120 mg/dL (calc) (ref <130)
Total CHOL/HDL Ratio: 4.6 (calc) (ref <5.0)
Triglycerides: 166 mg/dL — ABNORMAL HIGH (ref <150)

## 2018-01-06 LAB — COMPLETE METABOLIC PANEL WITH GFR
ALT: 14 U/L (ref 6–29)
AST: 16 U/L (ref 10–35)
Albumin: 4 g/dL (ref 3.6–5.1)
Alkaline phosphatase (APISO): 38 U/L (ref 33–130)
BUN/Creatinine Ratio: 19 (calc) (ref 6–22)
BUN: 26 mg/dL — ABNORMAL HIGH (ref 7–25)
CO2: 26 mmol/L (ref 20–32)
Chloride: 102 mmol/L (ref 98–110)
Creat: 1.34 mg/dL — ABNORMAL HIGH (ref 0.60–0.93)
Glucose, Bld: 82 mg/dL (ref 65–99)
Potassium: 4.5 mmol/L (ref 3.5–5.3)
Total Bilirubin: 0.6 mg/dL (ref 0.2–1.2)

## 2018-01-06 LAB — TEST AUTHORIZATION

## 2018-01-06 LAB — COMPLETE METABOLIC PANEL WITHOUT GFR
AG Ratio: 1.8 (calc) (ref 1.0–2.5)
Calcium: 9.7 mg/dL (ref 8.6–10.4)
GFR, Est African American: 45 mL/min/1.73m2 — ABNORMAL LOW (ref >=60)
GFR, Est Non African American: 39 mL/min/1.73m2 — ABNORMAL LOW (ref >=60)
Globulin: 2.2 g/dL (ref 1.9–3.7)
Sodium: 140 mmol/L (ref 135–146)
Total Protein: 6.2 g/dL (ref 6.1–8.1)

## 2018-01-06 LAB — VITAMIN D 25 HYDROXY (VIT D DEFICIENCY, FRACTURES): Vit D, 25-Hydroxy: 66 ng/mL (ref 30–100)

## 2018-01-06 LAB — MAGNESIUM: Magnesium: 2 mg/dL (ref 1.5–2.5)

## 2018-01-06 LAB — HEMOGLOBIN A1C
Hgb A1c MFr Bld: 5.5 % of total Hgb (ref <5.7)
Mean Plasma Glucose: 111 (calc)
eAG (mmol/L): 6.2 (calc)

## 2018-01-06 LAB — TSH: TSH: 5.44 mIU/L — ABNORMAL HIGH (ref 0.40–4.50)

## 2018-01-06 LAB — URIC ACID: Uric Acid, Serum: 4.9 mg/dL (ref 2.5–7.0)

## 2018-01-06 MED ORDER — LEVOTHYROXINE SODIUM 75 MCG PO TABS: 75.0000 ug | ORAL_TABLET | Freq: Every day | ORAL | 0 refills | Status: DC

## 2018-01-12 ENCOUNTER — Other Ambulatory Visit: Payer: Self-pay | Admitting: Internal Medicine

## 2018-01-12 MED ORDER — LEVOTHYROXINE SODIUM 75 MCG PO TABS: 75.0000 ug | ORAL_TABLET | Freq: Every day | ORAL | 1 refills | Status: DC

## 2018-01-23 ENCOUNTER — Encounter: Payer: Self-pay | Admitting: Internal Medicine

## 2018-01-23 DIAGNOSIS — H01024 Squamous blepharitis left upper eyelid: Secondary | ICD-10-CM | POA: Diagnosis not present

## 2018-01-23 DIAGNOSIS — L718 Other rosacea: Secondary | ICD-10-CM | POA: Diagnosis not present

## 2018-01-23 DIAGNOSIS — H01025 Squamous blepharitis left lower eyelid: Secondary | ICD-10-CM | POA: Diagnosis not present

## 2018-01-23 DIAGNOSIS — H01021 Squamous blepharitis right upper eyelid: Secondary | ICD-10-CM | POA: Diagnosis not present

## 2018-01-23 DIAGNOSIS — H01022 Squamous blepharitis right lower eyelid: Secondary | ICD-10-CM | POA: Diagnosis not present

## 2018-02-02 ENCOUNTER — Ambulatory Visit (INDEPENDENT_AMBULATORY_CARE_PROVIDER_SITE_OTHER): Payer: Medicare Other | Admitting: Internal Medicine

## 2018-02-02 ENCOUNTER — Encounter: Payer: Self-pay | Admitting: Internal Medicine

## 2018-02-02 VITALS — BP 116/64 | HR 68 | Temp 97.8°F | Resp 16 | Ht 64.0 in | Wt 195.0 lb

## 2018-02-02 DIAGNOSIS — K219 Gastro-esophageal reflux disease without esophagitis: Secondary | ICD-10-CM

## 2018-02-02 DIAGNOSIS — J439 Emphysema, unspecified: Secondary | ICD-10-CM

## 2018-02-02 DIAGNOSIS — Z136 Encounter for screening for cardiovascular disorders: Secondary | ICD-10-CM | POA: Diagnosis not present

## 2018-02-02 DIAGNOSIS — Z1211 Encounter for screening for malignant neoplasm of colon: Secondary | ICD-10-CM

## 2018-02-02 DIAGNOSIS — E039 Hypothyroidism, unspecified: Secondary | ICD-10-CM | POA: Diagnosis not present

## 2018-02-02 DIAGNOSIS — E559 Vitamin D deficiency, unspecified: Secondary | ICD-10-CM

## 2018-02-02 DIAGNOSIS — M109 Gout, unspecified: Secondary | ICD-10-CM

## 2018-02-02 DIAGNOSIS — J4489 Other specified chronic obstructive pulmonary disease: Secondary | ICD-10-CM

## 2018-02-02 DIAGNOSIS — I482 Chronic atrial fibrillation, unspecified: Secondary | ICD-10-CM | POA: Diagnosis not present

## 2018-02-02 DIAGNOSIS — Z7901 Long term (current) use of anticoagulants: Secondary | ICD-10-CM

## 2018-02-02 DIAGNOSIS — D692 Other nonthrombocytopenic purpura: Secondary | ICD-10-CM

## 2018-02-02 DIAGNOSIS — N183 Chronic kidney disease, stage 3 unspecified: Secondary | ICD-10-CM

## 2018-02-02 DIAGNOSIS — Z0000 Encounter for general adult medical examination without abnormal findings: Secondary | ICD-10-CM | POA: Diagnosis not present

## 2018-02-02 DIAGNOSIS — R7303 Prediabetes: Secondary | ICD-10-CM | POA: Insufficient documentation

## 2018-02-02 DIAGNOSIS — Z6833 Body mass index (BMI) 33.0-33.9, adult: Secondary | ICD-10-CM

## 2018-02-02 DIAGNOSIS — E782 Mixed hyperlipidemia: Secondary | ICD-10-CM

## 2018-02-02 DIAGNOSIS — E6609 Other obesity due to excess calories: Secondary | ICD-10-CM

## 2018-02-02 DIAGNOSIS — I10 Essential (primary) hypertension: Secondary | ICD-10-CM

## 2018-02-02 DIAGNOSIS — R7309 Other abnormal glucose: Secondary | ICD-10-CM

## 2018-02-02 DIAGNOSIS — T7840XS Allergy, unspecified, sequela: Secondary | ICD-10-CM

## 2018-02-02 DIAGNOSIS — J8410 Pulmonary fibrosis, unspecified: Secondary | ICD-10-CM

## 2018-02-02 DIAGNOSIS — I2510 Atherosclerotic heart disease of native coronary artery without angina pectoris: Secondary | ICD-10-CM

## 2018-02-02 DIAGNOSIS — Z1212 Encounter for screening for malignant neoplasm of rectum: Secondary | ICD-10-CM

## 2018-02-02 DIAGNOSIS — I2721 Secondary pulmonary arterial hypertension: Secondary | ICD-10-CM

## 2018-02-02 DIAGNOSIS — J449 Chronic obstructive pulmonary disease, unspecified: Secondary | ICD-10-CM

## 2018-02-02 DIAGNOSIS — I5042 Chronic combined systolic (congestive) and diastolic (congestive) heart failure: Secondary | ICD-10-CM

## 2018-02-02 DIAGNOSIS — Z0001 Encounter for general adult medical examination with abnormal findings: Secondary | ICD-10-CM

## 2018-02-02 DIAGNOSIS — Z79899 Other long term (current) drug therapy: Secondary | ICD-10-CM

## 2018-02-02 MED ORDER — PREDNISONE 20 MG PO TABS: ORAL_TABLET | ORAL | 0 refills | Status: DC

## 2018-02-02 MED ORDER — MONTELUKAST SODIUM 10 MG PO TABS: ORAL_TABLET | ORAL | 1 refills | Status: DC

## 2018-02-02 NOTE — Progress Notes (Signed)
Katie [name] [name], M.D.     Katie [name]. Silverio Lay, P.A.-C Liane Comber, [place] [address] [place], [place] [address] Telephone [phone] Telefax [phone] Annual Screening/Preventative Visit & Comprehensive Evaluation &  Examination     This very nice 77 y.o. Katie [name] presents for a Screening /Preventative Visit & comprehensive evaluation and management of multiple medical co-morbidities.  Patient has been followed for HTN, HLD, Hypothyroidism, Prediabetes  and Vitamin D Deficiency. 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 She has hx/o Gout quiescent on Allopurinol.       HTN predates since 1999. Patient's BP has been controlled at home. Patient has hx/o cAfib on Coumadin. She has CKD3 consequent of her HTN. In 2015, she had a AAA endovascular stent / graft.  She has also hospitalized recently  in Sept with decompensating systolic/diastolic CHF and  had a BIV ICD implantation by Dr Lovena Le. She reports marked improvement in exercise / activity tolerance since the procedure with now infrequent need to use her inhalers.   She  denies any cardiac type chest pain, palpitations,  dizziness or ankle swelling. Today's BP is at goal -  116/64.      Patient's hyperlipidemia is controlled with diet and medications. Patient denies myalgias or other medication SE's. Recent  lipids were at goal albeit sl elevated Trig's: Lab Results  Component Value Date   CHOL 153 01/05/2018   HDL 33 (L) 01/05/2018   LDLCALC 94 01/05/2018   TRIG 166 (H) 01/05/2018   CHOLHDL 4.6 01/05/2018      Patient was initiated on Thyroid Replacement in 2012.      Patient has hx/o Morbid Obesity (BMI 36+) and  prediabetes predating  (A1c 5.7% / 2013)  and patient denies reactive hypoglycemic symptoms, visual blurring, diabetic polys or paresthesias. Recent  A1c was Normal & at  goal:  Lab Results  Component Value Date   HGBA1C 5.5 01/05/2018      Finally, patient has history of Vitamin D Deficiency ("33" / 2008) and Recent  Vitamin D was at goal: Lab Results  Component Value Date   VD25OH 66 01/05/2018   Current Outpatient Medications on File Prior to Visit  Medication Sig  . acetaminophen (TYLENOL) 500 MG tablet Take 1,000 mg by mouth every 6 (six) hours as needed for moderate pain.   Marland Kitchen albuterol (PROAIR HFA) 108 (90 Base) MCG/ACT inhaler 2 inhalations 10-15 minutes apart evert 4 hours as needed for Asthma Flare (Patient taking differently: Inhale 2 puffs into the lungs every 4 (four) hours as needed for shortness of breath (asthma flare). )  . allopurinol (ZYLOPRIM) 300 MG tablet Take 300 mg by mouth daily.  Marland Kitchen aspirin EC 81 MG tablet Take 81 mg by mouth daily.  . cetirizine (ZYRTEC) 10 MG tablet Take 10 mg by mouth daily.  . Cholecalciferol (VITAMIN D3) 5000 units CAPS Take 5,000 Units by mouth daily.   . digoxin (LANOXIN) 0.125 MG tablet Take 1 tablet (0.125 mg total) by mouth daily.  . Eyelid Cleansers (AVENOVA) 0.01 % SOLN APPLY A SMALL AMOUNT TO SKIN TWICE A DAY AS DIRECTED  . gabapentin (NEURONTIN) 600 MG tablet Take 0.5 tablets (300 mg total) by mouth 2 (two) times daily.  Marland Kitchen guaiFENesin (MUCINEX PO) Take 1 tablet by mouth daily as needed (runny nose).  Marland Kitchen ipratropium (ATROVENT) 0.06 % nasal spray Use 1 to 2 sprays each Nostril  2 to 3 x / day as needed  . isosorbide mononitrate (IMDUR) 30 MG 24 hr tablet Take 1 tablet daily for BP & Heart (Patient taking differently: Take 30 mg by mouth daily. )  . levothyroxine (SYNTHROID) 75 MCG tablet Take 1 tablet (75 mcg total) by mouth daily before breakfast. Take 36min prior to breakfast with water.  . metoprolol succinate (TOPROL-XL) 50 MG 24 hr tablet Take 1 tablet (50 mg total) by mouth daily. Take with or immediately following a meal.  . Olopatadine HCl 0.2 % SOLN Place 1 drop into both eyes daily.   .  pantoprazole (PROTONIX) 40 MG tablet Take 1 tablet (40 mg total) by mouth daily.  [name] Glycol-Propyl Glycol (SYSTANE OP) Place 1 drop into both eyes 4 (four) times daily as needed (dry eyes).   . potassium chloride SA (KLOR-CON M20) 20 MEQ tablet TAKE 2 TABLETS (40 MEQ TOTAL) BY MOUTH DAILY.  . pravastatin (PRAVACHOL) 40 MG tablet Take 40 mg by mouth daily.  . prednisoLONE acetate (PRED FORTE) 1 % ophthalmic suspension Place 1 drop into both eyes 2 (two) times daily.   . prochlorperazine (COMPAZINE) 5 MG tablet TAKE 1 TABLET BY MOUTH THREE TIMES A DAY AS NEEDED FOR VERTIGO OR NAUSEA (Patient taking differently: Take 5 mg by mouth daily as needed for nausea (vertigo). )  . rivaroxaban (XARELTO) 20 MG TABS tablet Take 1 tablet (20 mg total) by mouth daily with supper.  . torsemide (DEMADEX) 20 MG tablet Take 4 tablets (80 mg total) by mouth daily.  Marland Kitchen triamcinolone cream (KENALOG) 0.1 % Apply 1 application topically daily.   . montelukast (SINGULAIR) 10 MG tablet Take 10 mg by mouth at bedtime.   No current facility-administered medications on file prior to visit.    Allergies  Allergen Reactions  . Amiodarone Other (See Comments)    PULMONARY TOXICITY  . Diovan [Valsartan] Other (See Comments)    HYPOTENSION  . Doxycycline Diarrhea and Other (See Comments)    VISUAL DISTURBANCE  . Flexeril [Cyclobenzaprine] Other (See Comments)    FATIGUE  . Keflex [Cephalexin] Diarrhea  . Verapamil Other (See Comments)    EDEMA  . Codeine Hives   Past Medical History:  Diagnosis Date  . AAA (abdominal aortic aneurysm) ([place])   . AICD (automatic cardioverter/defibrillator) present 11/10/2017  . Arthritis    "some in my knees" (11/10/2017)  . Atrial fibrillation ([place])   . CHF (congestive heart failure) ([place])   . Chronic bronchitis ([place])   . COPD (chronic obstructive pulmonary disease) ([place])    'they say I don't; have a problem breathing though" (11/10/2017)  . Depression   . GERD  (gastroesophageal reflux disease)   . Gout    "daily RX" (11/10/2017)  . History of hiatal hernia   . Hyperlipidemia   . Hypertension   . Hypothyroid   . Migraine headache    "hx; none since 1980s" (11/10/2017)  . Osteopenia   . Pneumonia    "~ 3 times" (11/10/2017)  . PVD (peripheral vascular disease) [facility])    Health Maintenance  Topic Date Due  . DEXA SCAN  09/14/2020  . COLONOSCOPY  03/15/2022  . TETANUS/TDAP  08/12/2027  . INFLUENZA VACCINE  Completed  . PNA vac Low Risk Adult  Completed   Immunization History  Administered Date(s) Administered  . Influenza, High Dose Seasonal PF 12/20/2013, 12/09/2014, 11/14/2015, 01/10/2017, 12/02/2017  . Influenza-Unspecified 01/02/2013  . PPD Test 07/17/2016  . Pneumococcal Conjugate-13 01/23/2014  .  Pneumococcal-Unspecified 03/15/1993, 05/31/2008  . Td 03/16/2007  . Tdap 08/11/2017  . Varicella 02/19/2008   Last Colon - 07/26/2002 - Dr Delfin Edis - Recc 10 yr f/u (refused in 2014)   Last MGM - 01/19/2018 Teola Bradley   Past Surgical History:  Procedure Laterality Date  . ABDOMINAL AORTIC ENDOVASCULAR STENT GRAFT N/A 04/11/2013   Procedure: ABDOMINAL AORTIC ENDOVASCULAR STENT GRAFT WITH RIGHT FEMORAL PATCH ANGIOPLASTY;  Surgeon: Mal Misty, MD;  Location: [facility];  Service: Vascular;  Laterality: N/A;  . BIV ICD INSERTION CRT-D  11/10/2017  . BIV ICD INSERTION CRT-D N/A 11/10/2017   Procedure: BIV ICD INSERTION CRT-D;  Surgeon: Evans Lance, MD;  Location: [facility];  Service: Cardiovascular;  Laterality: N/A;  . BREAST SURGERY     LEFT BREAST BIOPSY  . CARDIAC CATHETERIZATION    . CATARACT EXTRACTION W/ INTRAOCULAR LENS  IMPLANT, BILATERAL Bilateral   . CHOLECYSTECTOMY OPEN  1972  . COLONOSCOPY    . EYE SURGERY Bilateral    "bleeding in my eyes"  . FRACTURE SURGERY    . TIBIA FRACTURE SURGERY Left 1970s  . TONSILLECTOMY    . VARICOSE VEIN SURGERY Bilateral    "laser"   Family History  Problem Relation Age of  Onset  . Cirrhosis Mother   . Cancer Mother 62       PANCREAS  . Heart defect Sister   . Breast cancer Sister        age 91  . Heart disease Sister   . Stroke Sister   . Alcohol abuse Father   . Depression Father   . Hypertension Brother   . Hyperlipidemia Son   . Heart disease Daughter    Social History   Tobacco Use  . Smoking status: Former Smoker    Packs/day: 2.00    Years: 54.00    Pack years: 108.00    Types: Cigarettes    Last attempt to quit: 08/10/2002    Years since quitting: 15.4  . Smokeless tobacco: Never Used  Substance Use Topics  . Alcohol use: Yes    Comment: 11/10/2017 "glass of wine q 3-4 months"  . Drug use: Never    ROS Constitutional: Denies fever, chills, weight loss/gain, headaches, insomnia,  night sweats, and change in appetite. Does c/o fatigue. Eyes: Denies redness, blurred vision, diplopia, discharge, itchy, watery eyes.  ENT: Denies discharge, congestion, post nasal drip, epistaxis, sore throat, earache, hearing loss, dental pain, Tinnitus, Vertigo, Sinus pain, snoring.  Cardio: Denies chest pain, palpitations, irregular heartbeat, syncope, dyspnea, diaphoresis, orthopnea, PND, claudication, edema Respiratory: denies cough, dyspnea, DOE, pleurisy, hoarseness, laryngitis, wheezing.  Gastrointestinal: Denies dysphagia, heartburn, reflux, water brash, pain, cramps, nausea, vomiting, bloating, diarrhea, constipation, hematemesis, melena, hematochezia, jaundice, hemorrhoids Genitourinary: Denies dysuria, frequency, urgency, nocturia, hesitancy, discharge, hematuria, flank pain Breast: Breast lumps, nipple discharge, bleeding.  Musculoskeletal: Denies arthralgia, myalgia, stiffness, Jt. Swelling, pain, limp, and strain/sprain. Denies falls. Skin: Denies puritis, rash, hives, warts, acne, eczema, changing in skin lesion Neuro: No weakness, tremor, incoordination, spasms, paresthesia, pain Psychiatric: Denies confusion, memory loss, sensory loss. Denies  Depression. Endocrine: Denies change in weight, skin, hair change, nocturia, and paresthesia, diabetic polys, visual blurring, hyper / hypo glycemic episodes.  Heme/Lymph: No excessive bleeding, bruising, enlarged lymph nodes.  Physical Exam  BP 116/64   Pulse 68   Temp 97.8 F (36.6 C)   Resp 16   Ht 5\' 4"  (1.626 m)   Wt 195 lb (88.5 kg)  BMI 33.47 kg/m   General Appearance: Over nourished, well groomed and in no apparent distress.  Eyes: PERRLA, EOMs, conjunctiva no swelling or erythema, normal fundi and vessels. Sinuses: No frontal/maxillary tenderness ENT/Mouth: EACs patent / TMs  nl. Nares clear without erythema, swelling, mucoid exudates. Oral hygiene is good. No erythema, swelling, or exudate. Tongue normal, non-obstructing. Tonsils not swollen or erythematous. Hearing normal.  Neck: Supple, thyroid not palpable. No bruits, nodes or JVD. Respiratory: Respiratory effort normal.  BS equal and clear bilateral without rales, rhonci, wheezing or stridor. Cardio: Heart sounds are normal with regular rate and rhythm and no murmurs, rubs or gallops. Peripheral pulses are normal and equal bilaterally without edema. No aortic or femoral bruits. Chest: symmetric with normal excursions and percussion. Breasts: Symmetric, without lumps, nipple discharge, retractions, or fibrocystic changes.  Abdomen: Flat, soft with bowel sounds active. Nontender, no guarding, rebound, hernias, masses, or organomegaly.  Lymphatics: Non tender without lymphadenopathy.  Musculoskeletal: Full ROM all peripheral extremities, joint stability, 5/5 strength, and normal gait. Skin: Warm and dry without rashes, lesions, cyanosis, clubbing, but few scattered ecchymoses on dorsal forearms.  ecchymosis.  Neuro: Cranial nerves intact, reflexes equal bilaterally. Normal muscle tone, no cerebellar symptoms. Sensation intact.  Pysch: Alert and oriented X 3, normal affect, Insight and Judgment appropriate.   Assessment  and Plan  1. Annual Preventative Screening Examination  2. Essential hypertension  - EKG 12-Lead - Urinalysis, Routine w reflex microscopic - Microalbumin / creatinine urine ratio - CBC with Differential/Platelet - COMPLETE METABOLIC PANEL WITH GFR - Magnesium - TSH  3. Hyperlipidemia, mixed  - EKG 12-Lead - TSH  4. Vitamin D deficiency   5. Hypothyroidism  - EKG 12-Lead - TSH  6. Chronic atrial fibrillation  - EKG 12-Lead - Protime-INR - TSH  7. CKD  stage III (GFR 51 ml/min)  - Urinalysis, Routine w reflex microscopic - Microalbumin / creatinine urine ratio - COMPLETE METABOLIC PANEL WITH GFR  8. Gastroesophageal reflux disease without esophagitis  - CBC with Differential/Platelet  9. ASHD (arteriosclerotic heart disease)  - EKG 12-Lead  10. PAH (pulmonary artery hypertension) ([facility])  - EKG 12-Lead  11. Chronic combined systolic and diastolic congestive heart failure ([place])   12. Abnormal glucose  - EKG 12-Lead - Insulin, random  13. Prediabetes  - EKG 12-Lead - Insulin, random  14. Atherosclerosis of native coronary artery of native heart without angina pectoris  - EKG 12-Lead  15. Gout  - Uric acid  16. Pulmonary Fibrosis sequellae of Amiodarone   17. Pulmonary emphysema, unspecified emphysema type ([place])   18. COPD (chronic obstructive pulmonary disease) with chronic bronchitis ([place])   19. Long term current use of anticoagulant therapy  - Protime-INR  20. Medication management  - Urinalysis, Routine w reflex microscopic - Microalbumin / creatinine urine ratio - Uric acid - CBC with Differential/Platelet - COMPLETE METABOLIC PANEL WITH GFR - Magnesium - TSH - Insulin, random  21. Screening for colorectal cancer  - POC Hemoccult Bld/Stl        Patient was counseled in prudent diet to achieve/maintain BMI less than 25 for weight control, BP monitoring, regular exercise and medications. Discussed med's effects and SE's.  Screening labs and tests as requested with regular follow-up as recommended. Over 40 minutes of exam, counseling, chart review and high complex critical decision making was performed.

## 2018-02-02 NOTE — Patient Instructions (Signed)
Bleeding Precautions When on Anticoagulant Therapy  WHAT IS ANTICOAGULANT THERAPY? Anticoagulant therapy is taking medicine to prevent or reduce blood clots. It is also called blood thinner therapy. Blood clots that form in your blood vessels can be dangerous. They can break loose and travel to your heart, lungs, or brain. This increases your risk of a heart attack or stroke. Anticoagulant therapy causes blood to clot more slowly. You may need anticoagulant therapy if you have:  A medical condition that increases the likelihood that blood clots will form.  A heart defect or a problem with heart rhythm. It is also a common treatment after heart surgery, such as valve replacement. WHAT ARE COMMON TYPES OF ANTICOAGULANT THERAPY? Anticoagulant medicine can be injected or taken by mouth.If you need anticoagulant therapy quickly at the hospital, the medicine may be injected under your skin or given through an IV tube. Heparin is a common example of an anticoagulant that you may get at the hospital. Most anticoagulant therapy is in the form of pills that you take at home every day. These may include:  Aspirin. This common blood thinner works by preventing blood cells (platelets) from sticking together to form a clot. Aspirin is not as strong as anticoagulants that slow down the time that it takes for your body to form a clot.  Clopidogrel. This is a newer type of drug that affects platelets. It is stronger than aspirin.  Warfarin. This is the most common anticoagulant. It changes the way your body uses vitamin K, a vitamin that helps your blood to clot. The risk of bleeding is higher with warfarin than with aspirin. You will need frequent blood tests to make sure you are taking the safest amount.  New anticoagulants. Several new drugs have been approved. They are all taken by mouth. Studies show that these drugs work as well as warfarin. They do not require blood testing. They may cause less bleeding  risk than warfarin. WHAT DO I NEED TO REMEMBER WHEN TAKING ANTICOAGULANT THERAPY? Anticoagulant therapy decreases your risk of forming a blood clot, but it increases your risk of bleeding. Work closely with your health care provider to make sure you are taking your medicine safely. These tips can help:  Learn ways to reduce your risk of bleeding.  If you are taking warfarin: ? Have blood tests as ordered by your health care provider. ? Do not make any sudden changes to your diet. Vitamin K in your diet can make warfarin less effective. ? Do not get pregnant. This medicine may cause birth defects.  Take your medicine at the same time every day. If you forget to take your medicine, take it as soon as you remember. If you miss a whole day, do not double your dose of medicine. Take your normal dose and call your health care provider to check in.  Do not stop taking your medicine on your own.  Tell your health care provider before you start taking any new medicine, vitamin, or herbal product. Some of these could interfere with your therapy.  Tell all of your health care providers that you are on anticoagulant therapy.  Do not have surgery, medical procedures, or dental work until you tell your health care provider that you are on anticoagulant therapy. WHAT CAN AFFECT HOW ANTICOAGULANTS WORK? Certain foods, vitamins, medicines, supplements, and herbal medicines change the way that anticoagulant therapy works. They may increase or decrease the effects of your anticoagulant therapy. Either result can be dangerous for you.  Many over-the-counter medicines for pain, colds, or stomach problems interfere with anticoagulant therapy. Take these only as told by your health care provider.  Do not drink alcohol. It can interfere with your medicine and increase your risk of an injury that causes bleeding.  If you are taking warfarin, do not begin eating more foods that contain vitamin K. These include  leafy green vegetables. Ask your health care provider if you should avoid any foods. WHAT ARE SOME WAYS TO PREVENT BLEEDING? You can prevent bleeding by taking certain precautions:  Be extra careful when you use knives, scissors, or other sharp objects.  Use an electric razor instead of a blade.  Do not use toothpicks.  Use a soft toothbrush.  Wear shoes that have nonskid soles.  Use bath mats and handrails in your bathroom.  Wear gloves while you do yard work.  Wear a helmet when you ride a bike.  Wear your seat belt.  Prevent falls by removing loose rugs and extension cords from areas where you walk.  Do not play contact sports or participate in other activities that have a high risk of injury. [place] PROVIDER? Call your health care provider if:  You miss a dose of medicine: ? And you are not sure what to do. ? For more than one day.  You have: ? Menstrual bleeding that is heavier than normal. ? Blood in your urine. ? A bloody nose or bleeding gums. ? Easy bruising. ? Blood in your stool (feces) or have black and tarry stool. ? Side effects from your medicine.  You feel weak or dizzy.  You become pregnant. Seek immediate medical care if:  You have bleeding that will not stop.  You have sudden and severe headache or belly pain.  You vomit or you cough up bright red blood.  You have a severe blow to your head. WHAT ARE SOME QUESTIONS TO ASK MY HEALTH CARE PROVIDER?  What is the best anticoagulant therapy for my condition?  What side effects should I watch for?  When should I take my medicine? What should I do if I forget to take it?  Will I need to have regular blood tests?  Do I need to change my diet? Are there foods or drinks that I should avoid?  What activities are safe for me?  What should I do if I want to get pregnant? This information is not intended to replace advice given to you by your health care provider.  Make sure you discuss any questions you have with your health care provider. Document Released: 02/10/2015 Document Reviewed: 02/10/2015 Elsevier Interactive Patient Education  2017 [employer].   We Do NOT Approve of  [facility],  [employer]  Our Patients  To Do Home Visits & We Do NOT Approve of  [employer] > > > > > > > > > > > > > > > > > > > > > > > > > > > > > > > > > > > > > > >  Preventive Care for Adults  A healthy lifestyle and preventive care can promote health and wellness. Preventive health guidelines for women include the following key practices.  A routine yearly physical is a good way to check with your health care provider about your health and preventive screening. It is a chance to share any concerns and updates on your health and to receive a thorough exam.  Visit your dentist  for a routine exam and preventive care every 6 months. Brush your teeth twice a day and floss once a day. Good oral hygiene prevents tooth decay and gum disease.  The frequency of eye exams is based on your age, health, family medical history, use of contact lenses, and other factors. Follow your health care provider's recommendations for frequency of eye exams.  Eat a healthy diet. Foods like vegetables, fruits, whole grains, low-fat dairy products, and lean protein foods contain the nutrients you need without too many calories. Decrease your intake of foods high in solid fats, added sugars, and salt. Eat the right amount of calories for you. Get information about a proper diet from your health care provider, if necessary.  Regular physical exercise is one of the most important things you can do for your health. Most adults should get at least 150 minutes of moderate-intensity exercise (any activity that increases your heart rate and causes you to sweat) each week. In addition, most adults need muscle-strengthening exercises on 2 or more days a week.  Maintain a  healthy weight. The body mass index (BMI) is a screening tool to identify possible weight problems. It provides an estimate of body fat based on height and weight. Your health care provider can find your BMI and can help you achieve or maintain a healthy weight. For adults 20 years and older:  A BMI below 18.5 is considered underweight.  A BMI of 18.5 to 24.9 is normal.  A BMI of 25 to 29.9 is considered overweight.  A BMI of 30 and above is considered obese.  Maintain normal blood lipids and cholesterol levels by exercising and minimizing your intake of saturated fat. Eat a balanced diet with plenty of fruit and vegetables. If your lipid or cholesterol levels are high, you are over 50, or you are at high risk for heart disease, you may need your cholesterol levels checked more frequently. Ongoing high lipid and cholesterol levels should be treated with medicines if diet and exercise are not working.  If you smoke, find out from your health care provider how to quit. If you do not use tobacco, do not start.  Lung cancer screening is recommended for adults aged 58-80 years who are at high risk for developing lung cancer because of a history of smoking. A yearly low-dose CT scan of the lungs is recommended for people who have at least a 30-pack-year history of smoking and are a current smoker or have quit within the past 15 years. A pack year of smoking is smoking an average of 1 pack of cigarettes a day for 1 year (for example: 1 pack a day for 30 years or 2 packs a day for 15 years). Yearly screening should continue until the smoker has stopped smoking for at least 15 years. Yearly screening should be stopped for people who develop a health problem that would prevent them from having lung cancer treatment.  Avoid use of street drugs. Do not share needles with anyone. Ask for help if you need support or instructions about stopping the use of drugs.  High blood pressure causes heart disease and  increases the risk of stroke.  Ongoing high blood pressure should be treated with medicines if weight loss and exercise do not work.  If you are 63-16 years old, ask your health care provider if you should take aspirin to prevent strokes.  Diabetes screening involves taking a blood sample to check your fasting blood sugar level. This should be  done once every 3 years, after age 90, if you are within normal weight and without risk factors for diabetes. Testing should be considered at a younger age or be carried out more frequently if you are overweight and have at least 1 risk factor for diabetes.  Breast cancer screening is essential preventive care for women. You should practice "breast self-awareness." This means understanding the normal appearance and feel of your breasts and may include breast self-examination. Any changes detected, no matter how small, should be reported to a health care provider. Women in their 70s and 30s should have a clinical breast exam (CBE) by a health care provider as part of a regular health exam every 1 to 3 years. After age 41, women should have a CBE every year. Starting at age 54, women should consider having a mammogram (breast X-ray test) every year. Women who have a family history of breast cancer should talk to their health care provider about genetic screening. Women at a high risk of breast cancer should talk to their health care providers about having an MRI and a mammogram every year.  Breast cancer gene (BRCA)-related cancer risk assessment is recommended for women who have family members with BRCA-related cancers. BRCA-related cancers include breast, ovarian, tubal, and peritoneal cancers. Having family members with these cancers may be associated with an increased risk for harmful changes (mutations) in the breast cancer genes BRCA1 and BRCA2. Results of the assessment will determine the need for genetic counseling and BRCA1 and BRCA2 testing.  Routine pelvic  exams to screen for cancer are no longer recommended for nonpregnant women who are considered low risk for cancer of the pelvic organs (ovaries, uterus, and vagina) and who do not have symptoms. Ask your health care provider if a screening pelvic exam is right for you.  If you have had past treatment for cervical cancer or a condition that could lead to cancer, you need Pap tests and screening for cancer for at least 20 years after your treatment. If Pap tests have been discontinued, your risk factors (such as having a new sexual partner) need to be reassessed to determine if screening should be resumed. Some women have medical problems that increase the chance of getting cervical cancer. In these cases, your health care provider may recommend more frequent screening and Pap tests.    Colorectal cancer can be detected and often prevented. Most routine colorectal cancer screening begins at the age of 87 years and continues through age 52 years. However, your health care provider may recommend screening at an earlier age if you have risk factors for colon cancer. On a yearly basis, your health care provider may provide home test kits to check for hidden blood in the stool. Use of a small camera at the end of a tube, to directly examine the colon (sigmoidoscopy or colonoscopy), can detect the earliest forms of colorectal cancer. Talk to your health care provider about this at age 56, when routine screening begins.  Direct exam of the colon should be repeated every 5-10 years through age 41 years, unless early forms of pre-cancerous polyps or small growths are found.  Osteoporosis is a disease in which the bones lose minerals and strength with aging. This can result in serious bone fractures or breaks. The risk of osteoporosis can be identified using a bone density scan. Women ages 90 years and over and women at risk for fractures or osteoporosis should discuss screening with their health care providers. Ask  your  health care provider whether you should take a calcium supplement or vitamin D to reduce the rate of osteoporosis.  Menopause can be associated with physical symptoms and risks. Hormone replacement therapy is available to decrease symptoms and risks. You should talk to your health care provider about whether hormone replacement therapy is right for you.  Use sunscreen. Apply sunscreen liberally and repeatedly throughout the day. You should seek shade when your shadow is shorter than you. Protect yourself by wearing long sleeves, pants, a wide-brimmed hat, and sunglasses year round, whenever you are outdoors.  Once a month, do a whole body skin exam, using a mirror to look at the skin on your back. Tell your health care provider of new moles, moles that have irregular borders, moles that are larger than a pencil eraser, or moles that have changed in shape or color.  Stay current with required vaccines (immunizations).  Influenza vaccine. All adults should be immunized every year.  Tetanus, diphtheria, and acellular pertussis (Td, Tdap) vaccine. Pregnant women should receive 1 dose of Tdap vaccine during each pregnancy. The dose should be obtained regardless of the length of time since the last dose. Immunization is preferred during the 27th-36th week of gestation. An adult who has not previously received Tdap or who does not know her vaccine status should receive 1 dose of Tdap. This initial dose should be followed by tetanus and diphtheria toxoids (Td) booster doses every 10 years. Adults with an unknown or incomplete history of completing a 3-dose immunization series with Td-containing vaccines should begin or complete a primary immunization series including a Tdap dose. Adults should receive a Td booster every 10 years.    Zoster vaccine. One dose is recommended for adults aged 58 years or older unless certain conditions are present.    Pneumococcal 13-valent conjugate (PCV13) vaccine.  When indicated, a person who is uncertain of her immunization history and has no record of immunization should receive the PCV13 vaccine. An adult aged 36 years or older who has certain medical conditions and has not been previously immunized should receive 1 dose of PCV13 vaccine. This PCV13 should be followed with a dose of pneumococcal polysaccharide (PPSV23) vaccine. The PPSV23 vaccine dose should be obtained at least 1 or more year(s) after the dose of PCV13 vaccine. An adult aged 22 years or older who has certain medical conditions and previously received 1 or more doses of PPSV23 vaccine should receive 1 dose of PCV13. The PCV13 vaccine dose should be obtained 1 or more years after the last PPSV23 vaccine dose.    Pneumococcal polysaccharide (PPSV23) vaccine. When PCV13 is also indicated, PCV13 should be obtained first. All adults aged 21 years and older should be immunized. An adult younger than age 72 years who has certain medical conditions should be immunized. Any person who resides in a nursing home or long-term care facility should be immunized. An adult smoker should be immunized. People with an immunocompromised condition and certain other conditions should receive both PCV13 and PPSV23 vaccines. People with human immunodeficiency virus (HIV) infection should be immunized as soon as possible after diagnosis. Immunization during chemotherapy or radiation therapy should be avoided. Routine use of PPSV23 vaccine is not recommended for American Indians, [place]atives, or people younger than 65 years unless there are medical conditions that require PPSV23 vaccine. When indicated, people who have unknown immunization and have no record of immunization should receive PPSV23 vaccine. One-time revaccination 5 years after the first dose of PPSV23 is recommended  for people aged 19-64 years who have chronic kidney failure, nephrotic syndrome, asplenia, or immunocompromised conditions. People who received 1-2  doses of PPSV23 before age 61 years should receive another dose of PPSV23 vaccine at age 42 years or later if at least 5 years have passed since the previous dose. Doses of PPSV23 are not needed for people immunized with PPSV23 at or after age 77 years.   Preventive Services / Frequency  Ages 48 years and over  Blood pressure check.  Lipid and cholesterol check.  Lung cancer screening. / Every year if you are aged 3-80 years and have a 30-pack-year history of smoking and currently smoke or have quit within the past 15 years. Yearly screening is stopped once you have quit smoking for at least 15 years or develop a health problem that would prevent you from having lung cancer treatment.  Clinical breast exam.** / Every year after age 40 years.   BRCA-related cancer risk assessment.** / For women who have family members with a BRCA-related cancer (breast, ovarian, tubal, or peritoneal cancers).  Mammogram.** / Every year beginning at age 33 years and continuing for as long as you are in good health. Consult with your health care provider.  Pap test.** / Every 3 years starting at age 31 years through age 33 or 72 years with 3 consecutive normal Pap tests. Testing can be stopped between 65 and 70 years with 3 consecutive normal Pap tests and no abnormal Pap or HPV tests in the past 10 years.  Fecal occult blood test (FOBT) of stool. / Every year beginning at age 88 years and continuing until age 75 years. You may not need to do this test if you get a colonoscopy every 10 years.  Flexible sigmoidoscopy or colonoscopy.** / Every 5 years for a flexible sigmoidoscopy or every 10 years for a colonoscopy beginning at age 35 years and continuing until age 35 years.  Hepatitis C blood test.** / For all people born from [birth year] through 1964 and any individual with known risks for hepatitis C.  Osteoporosis screening.** / A one-time screening for women ages 84 years and over and women at risk for fractures  or osteoporosis.  Skin self-exam. / Monthly.  Influenza vaccine. / Every year.  Tetanus, diphtheria, and acellular pertussis (Tdap/Td) vaccine.** / 1 dose of Td every 10 years.  Zoster vaccine.** / 1 dose for adults aged 71 years or older.  Pneumococcal 13-valent conjugate (PCV13) vaccine.** / Consult your health care provider.  Pneumococcal polysaccharide (PPSV23) vaccine.** / 1 dose for all adults aged 91 years and older. Screening for abdominal aortic aneurysm (AAA)  by ultrasound is recommended for people who have history of high blood pressure or who are current or former smokers. ++++++++++++++++++++ Recommend Adult Low Dose Aspirin or  coated  Aspirin 81 mg daily  To reduce risk of Colon Cancer 20 %,  Skin Cancer 26 % ,  Melanoma 46%  and  Pancreatic cancer 60% ++++++++++++++++++++ Vitamin D goal  is between 70-100.  Please make sure that you are taking your Vitamin D as directed.  It is very important as a natural anti-inflammatory  helping hair, skin, and nails, as well as reducing stroke and heart attack risk.  It helps your bones and helps with mood. It also decreases numerous cancer risks so please take it as directed.  Low Vit D is associated with a 200-300% higher risk for CANCER  and 200-300% higher risk for HEART   ATTACK  &  STROKE.   .....................................Marland Kitchen It is also associated with higher death rate at younger ages,  autoimmune diseases like Rheumatoid arthritis, Lupus, Multiple Sclerosis.    Also many other serious conditions, like depression, Alzheimer's Dementia, infertility, muscle aches, fatigue, fibromyalgia - just to name a few. ++++++++++++++++++ Recommend the book "The END of DIETING" by Dr Excell Seltzer  & the book "The END of DIABETES " by Dr Excell Seltzer At [email or web address] - get book & Audio CD's    Being diabetic has a  300% increased risk for heart attack, stroke, cancer, and alzheimer- type vascular dementia. It is very important  that you work harder with diet by avoiding all foods that are white. Avoid white rice (brown & wild rice is OK), white potatoes (sweetpotatoes in moderation is OK), White bread or wheat bread or anything made out of white flour like bagels, donuts, rolls, buns, biscuits, cakes, pastries, cookies, pizza crust, and pasta (made from white flour & egg whites) - vegetarian pasta or spinach or wheat pasta is OK. Multigrain breads like Arnold's or [employer], or multigrain sandwich thins or flatbreads.  Diet, exercise and weight loss can reverse and cure diabetes in the early stages.  Diet, exercise and weight loss is very important in the control and prevention of complications of diabetes which affects every system in your body, ie. Brain - dementia/stroke, eyes - glaucoma/blindness, heart - heart attack/heart failure, kidneys - dialysis, stomach - gastric paralysis, intestines - malabsorption, nerves - severe painful neuritis, circulation - gangrene & loss of a leg(s), and finally cancer and Alzheimers.    I recommend avoid fried & greasy foods,  sweets/candy, white rice (brown or wild rice or Quinoa is OK), white potatoes (sweet potatoes are OK) - anything made from white flour - bagels, doughnuts, rolls, buns, biscuits,white and wheat breads, pizza crust and traditional pasta made of white flour & egg white(vegetarian pasta or spinach or wheat pasta is OK).  Multi-grain bread is OK - like multi-grain flat bread or sandwich thins. Avoid alcohol in excess. Exercise is also important.    Eat all the vegetables you want - avoid meat, especially red meat and dairy - especially cheese.  Cheese is the most concentrated form of trans-fats which is the worst thing to clog up our arteries. Veggie cheese is OK which can be found in the fresh produce section at [employer] or [employer] or [employer]  +++++++++++++++++++ DASH Eating Plan  DASH stands for "Dietary Approaches to Stop Hypertension."   The DASH  eating plan is a healthy eating plan that has been shown to reduce high blood pressure (hypertension). Additional health benefits may include reducing the risk of type 2 diabetes mellitus, heart disease, and stroke. The DASH eating plan may also help with weight loss. WHAT DO I NEED TO KNOW ABOUT THE DASH EATING PLAN? For the DASH eating plan, you will follow these general guidelines:  Choose foods with a percent daily value for sodium of less than 5% (as listed on the food label).  Use salt-free seasonings or herbs instead of table salt or sea salt.  Check with your health care provider or pharmacist before using salt substitutes.  Eat lower-sodium products, often labeled as "lower sodium" or "no salt added."  Eat fresh foods.  Eat more vegetables, fruits, and low-fat dairy products.  Choose whole grains. Look for the word "whole" as the first word in the ingredient list.  Choose fish   Limit sweets, desserts, sugars, and sugary drinks.  Choose heart-healthy fats.  Eat veggie cheese   Eat more home-cooked food and less restaurant, buffet, and fast food.  Limit fried foods.  Cook foods using methods other than frying.  Limit canned vegetables. If you do use them, rinse them well to decrease the sodium.  When eating at a restaurant, ask that your food be prepared with less salt, or no salt if possible.                      WHAT FOODS CAN I EAT? Read Dr Fara Olden Fuhrman's books on The End of Dieting & The End of Diabetes  Grains Whole grain or whole wheat bread. Brown rice. Whole grain or whole wheat pasta. Quinoa, bulgur, and whole grain cereals. Low-sodium cereals. Corn or whole wheat flour tortillas. Whole grain cornbread. Whole grain crackers. Low-sodium crackers.  Vegetables Fresh or frozen vegetables (raw, steamed, roasted, or grilled). Low-sodium or reduced-sodium tomato and vegetable juices. Low-sodium or reduced-sodium tomato sauce and paste. Low-sodium or reduced-sodium  canned vegetables.   Fruits All fresh, canned (in natural juice), or frozen fruits.  Protein Products  All fish and seafood.  Dried beans, peas, or lentils. Unsalted nuts and seeds. Unsalted canned beans.  Dairy Low-fat dairy products, such as skim or 1% milk, 2% or reduced-fat cheeses, low-fat ricotta or cottage cheese, or plain low-fat yogurt. Low-sodium or reduced-sodium cheeses.  Fats and Oils Tub margarines without trans fats. Light or reduced-fat mayonnaise and salad dressings (reduced sodium). Avocado. Safflower, olive, or canola oils. Natural peanut or almond butter.  Other Unsalted popcorn and pretzels. The items listed above may not be a complete list of recommended foods or beverages. Contact your dietitian for more options.  +++++++++++++++  WHAT FOODS ARE NOT RECOMMENDED? Grains/ White flour or wheat flour White bread. White pasta. White rice. Refined cornbread. Bagels and croissants. Crackers that contain trans fat.  Vegetables  Creamed or fried vegetables. Vegetables in a . Regular canned vegetables. Regular canned tomato sauce and paste. Regular tomato and vegetable juices.  Fruits Dried fruits. Canned fruit in light or heavy syrup. Fruit juice.  Meat and Other Protein Products Meat in general - RED meat & White meat.  Fatty cuts of meat. Ribs, chicken wings, all processed meats as bacon, sausage, bologna, salami, fatback, hot dogs, bratwurst and packaged luncheon meats.  Dairy Whole or 2% milk, cream, half-and-half, and cream cheese. Whole-fat or sweetened yogurt. Full-fat cheeses or blue cheese. Non-dairy creamers and whipped toppings. Processed cheese, cheese spreads, or cheese curds.  Condiments Onion and garlic salt, seasoned salt, table salt, and sea salt. Canned and packaged gravies. Worcestershire sauce. Tartar sauce. Barbecue sauce. Teriyaki sauce. Soy sauce, including reduced sodium. Steak sauce. Fish sauce. Oyster sauce. Cocktail sauce. Horseradish.  Ketchup and mustard. Meat flavorings and tenderizers. Bouillon cubes. Hot sauce. Tabasco sauce. Marinades. Taco seasonings. Relishes.  Fats and Oils Butter, stick margarine, lard, shortening and bacon fat. Coconut, palm kernel, or palm oils. Regular salad dressings.  Pickles and olives. Salted popcorn and pretzels.  The items listed above may not be a complete list of foods and beverages to avoid.

## 2018-02-03 ENCOUNTER — Other Ambulatory Visit: Payer: Self-pay | Admitting: Internal Medicine

## 2018-02-03 DIAGNOSIS — E039 Hypothyroidism, unspecified: Secondary | ICD-10-CM

## 2018-02-03 LAB — MICROALBUMIN / CREATININE URINE RATIO
Creatinine, Urine: 90 mg/dL (ref 20–275)
MICROALB/CREAT RATIO: 8 ug/mg{creat} (ref <30)
Microalb, Ur: 0.7 mg/dL

## 2018-02-03 LAB — URINALYSIS, ROUTINE W REFLEX MICROSCOPIC
BILIRUBIN URINE: NEGATIVE
GLUCOSE, UA: NEGATIVE
Hgb urine dipstick: NEGATIVE
Ketones, ur: NEGATIVE
LEUKOCYTES UA: NEGATIVE
Nitrite: NEGATIVE
PROTEIN: NEGATIVE
SPECIFIC GRAVITY, URINE: 1.017 (ref 1.001–1.03)
pH: <=5 (ref 5.0–8.0)

## 2018-02-03 LAB — MAGNESIUM: MAGNESIUM: 2.2 mg/dL (ref 1.5–2.5)

## 2018-02-03 LAB — CBC WITH DIFFERENTIAL/PLATELET
BASOS PCT: 0.7 %
Basophils Absolute: 50 cells/uL (ref 0–200)
EOS ABS: 223 {cells}/uL (ref 15–500)
EOS PCT: 3.1 %
HEMATOCRIT: 46.3 % — AB (ref 35.0–45.0)
Hemoglobin: 15.6 g/dL — ABNORMAL HIGH (ref 11.7–15.5)
LYMPHS ABS: 1886 {cells}/uL (ref 850–3900)
MCH: 30.1 pg (ref 27.0–33.0)
MCHC: 33.7 g/dL (ref 32.0–36.0)
MCV: 89.4 fL (ref 80.0–100.0)
MONOS PCT: 10.6 %
MPV: 11.6 fL (ref 7.5–12.5)
NEUTROS ABS: 4277 {cells}/uL (ref 1500–7800)
Neutrophils Relative %: 59.4 %
Platelets: 190 10*3/uL (ref 140–400)
RBC: 5.18 10*6/uL — ABNORMAL HIGH (ref 3.80–5.10)
RDW: 15.2 % — ABNORMAL HIGH (ref 11.0–15.0)
Total Lymphocyte: 26.2 %
WBC mixed population: 763 cells/uL (ref 200–950)
WBC: 7.2 10*3/uL (ref 3.8–10.8)

## 2018-02-03 LAB — COMPLETE METABOLIC PANEL WITH GFR
AG Ratio: 1.7 (calc) (ref 1.0–2.5)
ALBUMIN MSPROF: 4 g/dL (ref 3.6–5.1)
ALT: 10 U/L (ref 6–29)
AST: 18 U/L (ref 10–35)
Alkaline phosphatase (APISO): 39 U/L (ref 33–130)
BUN / CREAT RATIO: 15 (calc) (ref 6–22)
BUN: 21 mg/dL (ref 7–25)
CALCIUM: 9.9 mg/dL (ref 8.6–10.4)
CO2: 28 mmol/L (ref 20–32)
CREATININE: 1.42 mg/dL — AB (ref 0.60–0.93)
Chloride: 100 mmol/L (ref 98–110)
GFR, EST NON AFRICAN AMERICAN: 36 mL/min/{1.73_m2} — AB (ref >=60)
GFR, Est African American: 42 mL/min/{1.73_m2} — ABNORMAL LOW (ref >=60)
GLOBULIN: 2.3 g/dL (ref 1.9–3.7)
GLUCOSE: 81 mg/dL (ref 65–99)
Potassium: 4.2 mmol/L (ref 3.5–5.3)
SODIUM: 139 mmol/L (ref 135–146)
TOTAL PROTEIN: 6.3 g/dL (ref 6.1–8.1)
Total Bilirubin: 0.6 mg/dL (ref 0.2–1.2)

## 2018-02-03 LAB — URIC ACID: Uric Acid, Serum: 5.6 mg/dL (ref 2.5–7.0)

## 2018-02-03 LAB — TSH: TSH: 6.44 m[IU]/L — AB (ref 0.40–4.50)

## 2018-02-03 LAB — DIGOXIN LEVEL: DIGOXIN LVL: 0.9 ug/L (ref 0.8–2.0)

## 2018-02-03 LAB — PROTIME-INR
INR: 1.3 — ABNORMAL HIGH
Prothrombin Time: 13 s — ABNORMAL HIGH (ref 9.0–11.5)

## 2018-02-03 LAB — INSULIN, RANDOM: Insulin: 20 u[IU]/mL — ABNORMAL HIGH (ref 2.0–19.6)

## 2018-02-03 MED ORDER — LEVOTHYROXINE SODIUM 100 MCG PO TABS: ORAL_TABLET | ORAL | 1 refills | Status: DC

## 2018-02-04 ENCOUNTER — Encounter: Payer: Self-pay | Admitting: Internal Medicine

## 2018-02-13 ENCOUNTER — Telehealth: Payer: Self-pay | Admitting: *Deleted

## 2018-02-13 ENCOUNTER — Ambulatory Visit: Payer: Medicare Other | Admitting: Internal Medicine

## 2018-02-13 ENCOUNTER — Encounter: Payer: Self-pay | Admitting: Internal Medicine

## 2018-02-13 VITALS — BP 128/72 | HR 75 | Ht 64.0 in | Wt 195.4 lb

## 2018-02-13 DIAGNOSIS — I10 Essential (primary) hypertension: Secondary | ICD-10-CM

## 2018-02-13 DIAGNOSIS — I5042 Chronic combined systolic (congestive) and diastolic (congestive) heart failure: Secondary | ICD-10-CM

## 2018-02-13 DIAGNOSIS — I5022 Chronic systolic (congestive) heart failure: Secondary | ICD-10-CM

## 2018-02-13 DIAGNOSIS — Z95810 Presence of automatic (implantable) cardiac defibrillator: Secondary | ICD-10-CM | POA: Diagnosis not present

## 2018-02-13 DIAGNOSIS — I4821 Permanent atrial fibrillation: Secondary | ICD-10-CM | POA: Diagnosis not present

## 2018-02-13 LAB — CUP PACEART INCLINIC DEVICE CHECK
Implantable Lead Implant Date: 20190829
Implantable Lead Implant Date: 20190829
Implantable Lead Location: 753858
Implantable Lead Model: 292
Implantable Lead Model: 7740
Implantable Lead Serial Number: 446776
MDC IDC LEAD IMPLANT DT: 20190829
MDC IDC LEAD LOCATION: 753859
MDC IDC LEAD LOCATION: 753860
MDC IDC LEAD SERIAL: 696999
MDC IDC LEAD SERIAL: 827497
MDC IDC PG IMPLANT DT: 20190829
MDC IDC PG SERIAL: 215092
MDC IDC SESS DTM: 20191202095650

## 2018-02-13 NOTE — Progress Notes (Signed)
HPI Katie [name] returns today for followup. She is a pleasant 77 yo woman with permanent atrial fib who underwent biv ICD insertion due to LBBB and worsening CHF in the setting of LV dysfunction. She feels much better. She denies chest pain. Her dyspnea is class 1-2 from 2-3. She denies peripheral edema.  Allergies  Allergen Reactions  . Amiodarone Other (See Comments)    PULMONARY TOXICITY  . Diovan [Valsartan] Other (See Comments)    HYPOTENSION  . Doxycycline Diarrhea and Other (See Comments)    VISUAL DISTURBANCE  . Flexeril [Cyclobenzaprine] Other (See Comments)    FATIGUE  . Keflex [Cephalexin] Diarrhea  . Verapamil Other (See Comments)    EDEMA  . Codeine Hives     Current Outpatient Medications  Medication Sig Dispense Refill  . acetaminophen (TYLENOL) 500 MG tablet Take 1,000 mg by mouth every 6 (six) hours as needed for moderate pain.     Marland Kitchen albuterol (PROAIR HFA) 108 (90 Base) MCG/ACT inhaler 2 inhalations 10-15 minutes apart evert 4 hours as needed for Asthma Flare (Patient taking differently: Inhale 2 puffs into the lungs every 4 (four) hours as needed for shortness of breath (asthma flare). ) 48 g 3  . allopurinol (ZYLOPRIM) 300 MG tablet Take 300 mg by mouth daily.    Marland Kitchen aspirin EC 81 MG tablet Take 81 mg by mouth daily.    . cetirizine (ZYRTEC) 10 MG tablet Take 10 mg by mouth daily.    . Cholecalciferol (VITAMIN D3) 5000 units CAPS Take 5,000 Units by mouth daily.     . digoxin (LANOXIN) 0.125 MG tablet Take 1 tablet (0.125 mg total) by mouth daily. 90 tablet 1  . Eyelid Cleansers (AVENOVA) 0.01 % SOLN APPLY A SMALL AMOUNT TO SKIN TWICE A DAY AS DIRECTED  99  . gabapentin (NEURONTIN) 600 MG tablet Take 300 mg by mouth 3 (three) times daily.    Marland Kitchen guaiFENesin (MUCINEX PO) Take 1 tablet by mouth daily as needed (runny nose).    Marland Kitchen ipratropium (ATROVENT) 0.06 % nasal spray Use 1 to 2 sprays each Nostril 2 to 3 x / day as needed 45 mL 3  . isosorbide mononitrate  (IMDUR) 30 MG 24 hr tablet Take 1 tablet daily for BP & Heart (Patient taking differently: Take 30 mg by mouth daily. ) 90 tablet 1  . levothyroxine (SYNTHROID) 100 MCG tablet Take 1 tablet daily on an empty stomach with only water for 30 minutes. 90 tablet 1  . metoprolol succinate (TOPROL-XL) 50 MG 24 hr tablet Take 1 tablet (50 mg total) by mouth daily. Take with or immediately following a meal. 90 tablet 3  . montelukast (SINGULAIR) 10 MG tablet Take 1 tablet daily for Allergies & Asthma 90 tablet 1  . Olopatadine HCl 0.2 % SOLN Place 1 drop into both eyes daily.     . pantoprazole (PROTONIX) 40 MG tablet Take 1 tablet (40 mg total) by mouth daily.    [name] Glycol-Propyl Glycol (SYSTANE OP) Place 1 drop into both eyes 4 (four) times daily as needed (dry eyes).     . potassium chloride SA (KLOR-CON M20) 20 MEQ tablet TAKE 2 TABLETS (40 MEQ TOTAL) BY MOUTH DAILY. 180 tablet 1  . pravastatin (PRAVACHOL) 40 MG tablet Take 40 mg by mouth daily.    . prednisoLONE acetate (PRED FORTE) 1 % ophthalmic suspension Place 1 drop into both eyes 2 (two) times daily.   1  . prochlorperazine (  COMPAZINE) 5 MG tablet TAKE 1 TABLET BY MOUTH THREE TIMES A DAY AS NEEDED FOR VERTIGO OR NAUSEA (Patient taking differently: Take 5 mg by mouth daily as needed for nausea (vertigo). ) 60 tablet 1  . rivaroxaban (XARELTO) 20 MG TABS tablet Take 1 tablet (20 mg total) by mouth daily with supper. 30 tablet 5  . torsemide (DEMADEX) 20 MG tablet Take 4 tablets (80 mg total) by mouth daily. 120 tablet 0  . triamcinolone cream (KENALOG) 0.1 % Apply 1 application topically daily.      No current facility-administered medications for this visit.      Past Medical History:  Diagnosis Date  . AAA (abdominal aortic aneurysm) ([place])   . AICD (automatic cardioverter/defibrillator) present 11/10/2017  . Arthritis    "some in my knees" (11/10/2017)  . Atrial fibrillation ([place])   . CHF (congestive heart failure) ([place])   .  Chronic bronchitis ([place])   . COPD (chronic obstructive pulmonary disease) ([place])    'they say I don't; have a problem breathing though" (11/10/2017)  . Depression   . GERD (gastroesophageal reflux disease)   . Gout    "daily RX" (11/10/2017)  . History of hiatal hernia   . Hyperlipidemia   . Hypertension   . Hypothyroid   . Migraine headache    "hx; none since 1980s" (11/10/2017)  . Osteopenia   . Pneumonia    "~ 3 times" (11/10/2017)  . PVD (peripheral vascular disease) ([place])     ROS:   All systems reviewed and negative except as noted in the HPI.   Past Surgical History:  Procedure Laterality Date  . ABDOMINAL AORTIC ENDOVASCULAR STENT GRAFT N/A 04/11/2013   Procedure: ABDOMINAL AORTIC ENDOVASCULAR STENT GRAFT WITH RIGHT FEMORAL PATCH ANGIOPLASTY;  Surgeon: Mal Misty, MD;  Location: [facility];  Service: Vascular;  Laterality: N/A;  . BIV ICD INSERTION CRT-D  11/10/2017  . BIV ICD INSERTION CRT-D N/A 11/10/2017   Procedure: BIV ICD INSERTION CRT-D;  Surgeon: Evans Lance, MD;  Location: [facility];  Service: Cardiovascular;  Laterality: N/A;  . BREAST SURGERY     LEFT BREAST BIOPSY  . CARDIAC CATHETERIZATION    . CATARACT EXTRACTION W/ INTRAOCULAR LENS  IMPLANT, BILATERAL Bilateral   . CHOLECYSTECTOMY OPEN  1972  . COLONOSCOPY    . EYE SURGERY Bilateral    "bleeding in my eyes"  . FRACTURE SURGERY    . TIBIA FRACTURE SURGERY Left 1970s  . TONSILLECTOMY    . VARICOSE VEIN SURGERY Bilateral    "laser"     Family History  Problem Relation Age of Onset  . Cirrhosis Mother   . Cancer Mother 28       PANCREAS  . Heart defect Sister   . Breast cancer Sister        age 75  . Heart disease Sister   . Stroke Sister   . Alcohol abuse Father   . Depression Father   . Hypertension Brother   . Hyperlipidemia Son   . Heart disease Daughter      Social History   Socioeconomic History  . Marital status: Married    Spouse name: Not on file  . Number of  children: Not on file  . Years of education: Not on file  . Highest education level: Not on file  Occupational History  . Not on file  Social Needs  . Financial resource strain: Somewhat hard  . Food insecurity:    Worry:  Never true    Inability: Never true  . Transportation needs:    Medical: No    Non-medical: No  Tobacco Use  . Smoking status: Former Smoker    Packs/day: 2.00    Years: 54.00    Pack years: 108.00    Types: Cigarettes    Last attempt to quit: 08/10/2002    Years since quitting: 15.5  . Smokeless tobacco: Never Used  Substance and Sexual Activity  . Alcohol use: Yes    Comment: 11/10/2017 "glass of wine q 3-4 months"  . Drug use: Never  . Sexual activity: Yes    Birth control/protection: Post-menopausal  Lifestyle  . Physical activity:    Days per week: 3 days    Minutes per session: 40 min  . Stress: Not at all  Relationships  . Social connections:    Talks on phone: More than three times a week    Gets together: Twice a week    Attends religious service: More than 4 times per year    Active member of club or organization: Yes    Attends meetings of clubs or organizations: Never    Relationship status: Married  . Intimate partner violence:    Fear of current or ex partner: No    Emotionally abused: No    Physically abused: No    Forced sexual activity: No  Other Topics Concern  . Not on file  Social History Narrative   Lives in [place] with husband   One daughter     BP 128/72   Pulse 75   Ht 5\' 4"  (1.626 m)   Wt 195 lb 6.4 oz (88.6 kg)   BMI 33.54 kg/m   Physical Exam:  Well appearing NAD HEENT: Unremarkable Neck:  No JVD, no thyromegally Lymphatics:  No adenopathy Back:  No CVA tenderness Lungs:  Clear with no wheezes HEART:  Regular rate rhythm, no murmurs, no rubs, no clicks Abd:  soft, positive bowel sounds, no organomegally, no rebound, no guarding Ext:  2 plus pulses, no edema, no cyanosis, no clubbing Skin:  No rashes  no nodules Neuro:  CN II through XII intact, motor grossly intact  EKG - atrial fib with biv pacing  DEVICE  Normal device function.  See PaceArt for details.   Assess/Plan: 1. Chronic systolic heart failure - her symptoms are improved. She will continue her current meds and maintain a low sodium diet. 2. Atrial fib - her ventricular rates are well controlled. 3. HTN - her blood pressure is controlled. We will follow. 4. ICD - her [place] Biv ICD is working normally. We will recheck in several months.  Mikle Bosworth.D.

## 2018-02-13 NOTE — Patient Instructions (Signed)
Medication Instructions:  Your physician recommends that you continue on your current medications as directed. Please refer to the Current Medication list given to you today.  Labwork: None ordered.  Testing/Procedures: None ordered.  Follow-Up: Your physician wants you to follow-up in: 9 months with Dr. Lovena Le.   You will receive a reminder letter in the mail two months in advance. If you don't receive a letter, please call our office to schedule the follow-up appointment.  Remote monitoring is used to monitor your ICD from home. This monitoring reduces the number of office visits required to check your device to one time per year. It allows [place] to keep an eye on the functioning of your device to ensure it is working properly. You are scheduled for a device check from home on 05/15/2018. You may send your transmission at any time that day. If you have a wireless device, the transmission will be sent automatically. After your physician reviews your transmission, you will receive a postcard with your next transmission date.  Any Other Special Instructions Will Be Listed Below (If Applicable).  If you need a refill on your cardiac medications before your next appointment, please call your pharmacy.

## 2018-02-13 NOTE — Telephone Encounter (Signed)
Patient was called to advise she is due a colonoscopy, but is eligible for the Cologuard test, instead, per Dr Melford Aase. The patient states she declines both tests, due to her age.  Dr Melford Aase is aware and will discuss with the patient later.

## 2018-02-22 ENCOUNTER — Telehealth (INDEPENDENT_AMBULATORY_CARE_PROVIDER_SITE_OTHER): Payer: Self-pay

## 2018-02-22 NOTE — Telephone Encounter (Signed)
Received fax from [facility] stating that authorization is approved for Monovisc injection. Reference# [id number] Valid 03/15/2018- 03/15/2019

## 2018-02-23 ENCOUNTER — Encounter (INDEPENDENT_AMBULATORY_CARE_PROVIDER_SITE_OTHER): Payer: Self-pay | Admitting: Physician Assistant

## 2018-02-23 ENCOUNTER — Ambulatory Visit (INDEPENDENT_AMBULATORY_CARE_PROVIDER_SITE_OTHER): Payer: Medicare Other | Admitting: Physician Assistant

## 2018-02-23 DIAGNOSIS — M19172 Post-traumatic osteoarthritis, left ankle and foot: Secondary | ICD-10-CM

## 2018-02-23 MED ORDER — METHYLPREDNISOLONE ACETATE 40 MG/ML IJ SUSP
40.0000 mg | INTRAMUSCULAR | Status: AC | PRN
  Administered 2018-02-23: 40 mg via INTRA_ARTICULAR

## 2018-02-23 MED ORDER — LIDOCAINE HCL 1 % IJ SOLN
2.0000 mL | INTRAMUSCULAR | Status: AC | PRN
  Administered 2018-02-23: 2 mL

## 2018-02-23 NOTE — Progress Notes (Signed)
   Procedure Note  Patient: Katie [name]             Date of Birth: 11-Oct-1940           MRN: [id number]             Visit Date: 02/23/2018 HPI: Ms. Montesano returns today for follow-up of her left ankle.  She saw Dr. Lucia Gaskins he told her that he could do a left ankle replacement but it would take for a year or longer to get over here recommend she continue with the periodic cortisone injections in her ankle.  She therefore comes in today requesting an injection in the left ankle.  Last injection was given in September.  She is having significant pain in the ankle having difficulty ambulating.  Physical exam left ankle decreased dorsiflexion plantarflexion inversion eversion.  No rashes skin lesions ulcerations or erythema.  Calf supple nontender. Procedures: Visit Diagnoses: Post-traumatic arthritis of ankle, left  Medium Joint Inj: L ankle on 02/23/2018 3:17 PM Details: 25 G needle, anterolateral approach Medications: 2 mL lidocaine 1 %; 40 mg methylPREDNISolone acetate 40 MG/ML    Plan: She understands that she needs to wait 3 months at least between injections in the ankle.  She will follow-up with [place] on as-needed basis.  May consider left ankle arthroscopy for debridement if her pain continues despite conservative treatment

## 2018-02-24 ENCOUNTER — Other Ambulatory Visit: Payer: Self-pay | Admitting: Internal Medicine

## 2018-02-27 DIAGNOSIS — Z803 Family history of malignant neoplasm of breast: Secondary | ICD-10-CM | POA: Diagnosis not present

## 2018-02-27 DIAGNOSIS — Z1231 Encounter for screening mammogram for malignant neoplasm of breast: Secondary | ICD-10-CM | POA: Diagnosis not present

## 2018-02-27 DIAGNOSIS — Z8262 Family history of osteoporosis: Secondary | ICD-10-CM | POA: Diagnosis not present

## 2018-02-27 DIAGNOSIS — Z780 Asymptomatic menopausal state: Secondary | ICD-10-CM | POA: Diagnosis not present

## 2018-02-27 DIAGNOSIS — E349 Endocrine disorder, unspecified: Secondary | ICD-10-CM | POA: Diagnosis not present

## 2018-02-27 LAB — HM DEXA SCAN

## 2018-02-27 LAB — HM MAMMOGRAPHY

## 2018-03-01 ENCOUNTER — Encounter: Payer: Self-pay | Admitting: Internal Medicine

## 2018-03-01 ENCOUNTER — Encounter: Payer: Self-pay | Admitting: Adult Health

## 2018-03-01 ENCOUNTER — Ambulatory Visit (INDEPENDENT_AMBULATORY_CARE_PROVIDER_SITE_OTHER): Payer: Medicare Other | Admitting: Adult Health

## 2018-03-01 VITALS — BP 112/66 | HR 58 | Temp 97.9°F | Ht 64.0 in | Wt 200.0 lb

## 2018-03-01 DIAGNOSIS — J00 Acute nasopharyngitis [common cold]: Secondary | ICD-10-CM | POA: Diagnosis not present

## 2018-03-01 DIAGNOSIS — R6889 Other general symptoms and signs: Secondary | ICD-10-CM | POA: Diagnosis not present

## 2018-03-01 DIAGNOSIS — E039 Hypothyroidism, unspecified: Secondary | ICD-10-CM

## 2018-03-01 LAB — TSH: TSH: 2.59 mIU/L (ref 0.40–4.50)

## 2018-03-01 LAB — POC INFLUENZA A&B (BINAX/QUICKVUE)
Influenza A, POC: NEGATIVE
Influenza B, POC: NEGATIVE

## 2018-03-01 MED ORDER — AZITHROMYCIN 250 MG PO TABS: ORAL_TABLET | ORAL | 1 refills | Status: AC

## 2018-03-01 MED ORDER — PREDNISONE 20 MG PO TABS: ORAL_TABLET | ORAL | 0 refills | Status: DC

## 2018-03-01 MED ORDER — PROMETHAZINE-DM 6.25-15 MG/5ML PO SYRP: 5.0000 mL | ORAL_SOLUTION | Freq: Four times a day (QID) | ORAL | 1 refills | Status: DC | PRN

## 2018-03-01 NOTE — Progress Notes (Signed)
Assessment and Plan:  Katie [name] was seen today for acute visit and generalized body aches.  Diagnoses and all orders for this visit:  Acute nasopharyngitis Mild symptoms/exam, but will treat aggressively due to comorbidities and hx of rapid progression of URI to pneumonia Suggested symptomatic OTC remedies. Nasal saline spray for congestion. Nasal steroids, allergy pill, oral steroids offered BREO sample provided -  Follow up as needed. -     azithromycin (ZITHROMAX) 250 MG tablet; Take 2 tablets (500 mg) on  Day 1,  followed by 1 tablet (250 mg) once daily on Days 2 through 5. -     predniSONE (DELTASONE) 20 MG tablet; 2 tablets daily for 3 days, 1 tablet daily for 4 days. -     promethazine-dextromethorphan (PROMETHAZINE-DM) 6.25-15 MG/5ML syrup; Take 5 mLs by mouth 4 (four) times daily as needed for cough.  Flu-like symptoms -     POC Influenza A&B(BINAX/QUICKVUE) - neg  Further disposition pending results of labs. Discussed med's effects and SE's.   Over 15 minutes of exam, counseling, chart review, and critical decision making was performed.   Future Appointments  Date Time Provider [place]  05/15/2018  7:05 AM [facility] DEVICE REMOTES [facility]  05/16/2018  9:30 AM Liane Comber, NP [facility] None  08/23/2018  2:00 PM Liane Comber, NP [facility] None  02/27/2019  9:00 AM [name], MD [facility] None    ------------------------------------------------------------------------------------------------------------------   HPI BP 112/66   Pulse (!) 58   Temp 97.9 F (36.6 C)   Ht 5\' 4"  (1.626 m)   Wt 200 lb (90.7 kg)   SpO2 98%   BMI 34.33 kg/m   77 y.o.female with hx COPD, CHF, pulmonary fibrosis, PAH, a fib presents for evaluation of chest congestion that began yesterday with productive cough that kept her up all night, she also endorses runny nose, facial pressure, mild headache 2 days ago that resolved quickly with tylenol. This AM  secretions were thick and yellow. She also endorses generalizes body aches. She reports feeling mildly fatigued and short of breath with coughing since yesterday. Denies exertional dyspnea. Denies chest pains, palpitations, LE edema. She does endorse feeling wheezy yesterday. She was admitted to ICU last year due to influenza and is very concerned about this. She did have the flu vaccine this season.    She in on singulair daily, has used albuterol twice in the last 2 days. She had been on zyrtec but ran out and has been off of this.    Past Medical History:  Diagnosis Date  . AAA (abdominal aortic aneurysm) ([place])   . AICD (automatic cardioverter/defibrillator) present 11/10/2017  . Arthritis    "some in my knees" (11/10/2017)  . Atrial fibrillation ([place])   . CHF (congestive heart failure) ([place])   . Chronic bronchitis ([place])   . COPD (chronic obstructive pulmonary disease) ([place])    'they say I don't; have a problem breathing though" (11/10/2017)  . Depression   . GERD (gastroesophageal reflux disease)   . Gout    "daily RX" (11/10/2017)  . History of hiatal hernia   . Hyperlipidemia   . Hypertension   . Hypothyroid   . Migraine headache    "hx; none since 1980s" (11/10/2017)  . Osteopenia   . Pneumonia    "~ 3 times" (11/10/2017)  . PVD (peripheral vascular disease) ([facility])      Allergies  Allergen Reactions  . Amiodarone Other (See Comments)    PULMONARY TOXICITY  . Diovan [Valsartan] Other (  See Comments)    HYPOTENSION  . Doxycycline Diarrhea and Other (See Comments)    VISUAL DISTURBANCE  . Flexeril [Cyclobenzaprine] Other (See Comments)    FATIGUE  . Keflex [Cephalexin] Diarrhea  . Verapamil Other (See Comments)    EDEMA  . Codeine Hives    Current Outpatient Medications on File Prior to Visit  Medication Sig  . acetaminophen (TYLENOL) 500 MG tablet Take 1,000 mg by mouth every 6 (six) hours as needed for moderate pain.   Marland Kitchen albuterol (PROAIR HFA) 108 (90 Base) MCG/ACT  inhaler 2 inhalations 10-15 minutes apart evert 4 hours as needed for Asthma Flare (Patient taking differently: Inhale 2 puffs into the lungs every 4 (four) hours as needed for shortness of breath (asthma flare). )  . allopurinol (ZYLOPRIM) 300 MG tablet Take 300 mg by mouth daily.  Marland Kitchen aspirin EC 81 MG tablet Take 81 mg by mouth daily.  . cetirizine (ZYRTEC) 10 MG tablet Take 10 mg by mouth as needed.   . Cholecalciferol (VITAMIN D3) 5000 units CAPS Take 5,000 Units by mouth daily.   . digoxin (LANOXIN) 0.125 MG tablet Take 1 tablet (0.125 mg total) by mouth daily.  . Eyelid Cleansers (AVENOVA) 0.01 % SOLN APPLY A SMALL AMOUNT TO SKIN TWICE A DAY AS DIRECTED  . gabapentin (NEURONTIN) 600 MG tablet Take 300 mg by mouth 3 (three) times daily.  Marland Kitchen ipratropium (ATROVENT) 0.06 % nasal spray Use 1 to 2 sprays each Nostril 2 to 3 x / day as needed  . isosorbide mononitrate (IMDUR) 30 MG 24 hr tablet Take 1 tablet daily for BP & Heart (Patient taking differently: Take 30 mg by mouth daily. )  . levothyroxine (SYNTHROID) 100 MCG tablet Take 1 tablet daily on an empty stomach with only water for 30 minutes.  . Olopatadine HCl 0.2 % SOLN Place 1 drop into both eyes daily.   . pantoprazole (PROTONIX) 40 MG tablet Take 1 tablet (40 mg total) by mouth daily.  [name] Glycol-Propyl Glycol (SYSTANE OP) Place 1 drop into both eyes 4 (four) times daily as needed (dry eyes).   . potassium chloride SA (KLOR-CON M20) 20 MEQ tablet TAKE 2 TABLETS (40 MEQ TOTAL) BY MOUTH DAILY.  . pravastatin (PRAVACHOL) 40 MG tablet Take 40 mg by mouth daily.  . prednisoLONE acetate (PRED FORTE) 1 % ophthalmic suspension Place 1 drop into both eyes 2 (two) times daily.   . prochlorperazine (COMPAZINE) 5 MG tablet TAKE 1 TABLET BY MOUTH THREE TIMES A DAY AS NEEDED FOR VERTIGO OR NAUSEA (Patient taking differently: Take 5 mg by mouth daily as needed for nausea (vertigo). )  . rivaroxaban (XARELTO) 20 MG TABS tablet Take 1 tablet (20 mg  total) by mouth daily with supper.  . torsemide (DEMADEX) 20 MG tablet TAKE 2 TABLETS (40 MG TOTAL) BY MOUTH 2 (TWO) TIMES DAILY.  Marland Kitchen triamcinolone cream (KENALOG) 0.1 % Apply 1 application topically daily.   Marland Kitchen guaiFENesin (MUCINEX PO) Take 1 tablet by mouth daily as needed (runny nose).  . metoprolol succinate (TOPROL-XL) 50 MG 24 hr tablet Take 1 tablet (50 mg total) by mouth daily. Take with or immediately following a meal.  . montelukast (SINGULAIR) 10 MG tablet Take 1 tablet daily for Allergies & Asthma (Patient not taking: Reported on 03/01/2018)   No current facility-administered medications on file prior to visit.     ROS: all negative except above.   Physical Exam:  BP 112/66   Pulse (!) 58  Temp 97.9 F (36.6 C)   Ht 5\' 4"  (1.626 m)   Wt 200 lb (90.7 kg)   SpO2 98%   BMI 34.33 kg/m   General Appearance: Well nourished, in no apparent distress. Eyes: PERRLA, conjunctiva no swelling or erythema Sinuses: No Frontal tenerness, generalized maxillary tenderness, flushing through cheeks ENT/Mouth: Ext aud canals clear, TMs without erythema, bulging. No erythema, swelling, or exudate on post pharynx.  Tonsils not swollen or erythematous. Hearing normal.  Neck: Supple Respiratory: Respiratory effort normal, mild pursed lip breathing (this is normal/baseline for patient), BS equal bilaterally without rales, rhonchi, wheezing or stridor.  Cardio: RRR with 3/6 systeolic murmur. Brisk peripheral pulses without edema.  Abdomen: Soft, + BS.  Non tender Lymphatics: Non tender without lymphadenopathy.  Musculoskeletal: Symmetrical strength, normal gait.  Skin: Warm, dry without rashes, lesions, ecchymosis.  Neuro: Cranial nerves intact. Normal muscle tone, no cerebellar symptoms. Psych: Awake and oriented X 3, normal affect, Insight and Judgment appropriate.     Izora Ribas, NP 9:53 AM [facility]

## 2018-03-01 NOTE — Patient Instructions (Signed)
HOW TO TREAT COUGH AND COLD SYMPTOMS:  -Symptoms usually last at least 1 week with the worst symptoms being around day 4.  - colds usually start with a sore throat and end with a cough, and the cough can take 2 weeks to get better.   -There are a lot of combination medications (Dayquil, Nyquil, Vicks 44, tyelnol cold and sinus, ETC). Please look at the ingredients on the back so that you are treating the correct symptoms and not doubling up on medications/ingredients.    Medicines you can use  Nasal congestion  Little Remedies saline spray (aerosol/mist)- can try this, it is in the kids section - pseudoephedrine (Sudafed)- behind the counter, do not use if you have high blood pressure, medicine that have -D in them.  - phenylephrine (Sudafed PE) -Dextormethorphan + chlorpheniramine (Coridcidin HBP)- okay if you have high blood pressure -Oxymetazoline (Afrin) nasal spray- LIMIT to 3 days -Saline nasal spray -Neti pot (used distilled or bottled water)  Ear pain/congestion  -pseudoephedrine (sudafed) - Nasonex/flonase nasal spray  Fever  -Acetaminophen (Tyelnol) -Ibuprofen (Advil, motrin, aleve)  Sore Throat  -Acetaminophen (Tyelnol) -Ibuprofen (Advil, motrin, aleve) -Drink a lot of water -Gargle with salt water - Rest your voice (don't talk) -Throat sprays -Cough drops  Body Aches  -Acetaminophen (Tyelnol) -Ibuprofen (Advil, motrin, aleve)  Headache  -Acetaminophen (Tyelnol) -Ibuprofen (Advil, motrin, aleve) - Exedrin, Exedrin Migraine  Allergy symptoms (cough, sneeze, runny nose, itchy eyes) -Claritin or loratadine cheapest but likely the weakest  -Zyrtec or certizine at night because it can make you sleepy -The strongest is allegra or fexafinadine  Cheapest at [employer], [employer], [employer]  Cough  -Dextromethorphan (Delsym)- medicine that has DM in it -Guafenesin (Mucinex/Robitussin) - cough drops - drink lots of water  Chest Congestion  -Guafenesin  (Mucinex/Robitussin)  Red Itchy Eyes  - Naphcon-A  Upset Stomach  - Bland diet (nothing spicy, greasy, fried, and high acid foods like tomatoes, oranges, berries) -OKAY- cereal, bread, soup, crackers, rice -Eat smaller more frequent meals -reduce caffeine, no alcohol -Loperamide (Imodium-AD) if diarrhea -Prevacid for heart burn  General health when sick  -Hydration -wash your hands frequently -keep surfaces clean -change pillow cases and sheets often -Get fresh air but do not exercise strenuously -Vitamin D, double up on it - Vitamin C -Zinc

## 2018-03-06 ENCOUNTER — Ambulatory Visit: Payer: Self-pay

## 2018-03-20 ENCOUNTER — Encounter: Payer: Self-pay | Admitting: *Deleted

## 2018-03-30 ENCOUNTER — Ambulatory Visit (INDEPENDENT_AMBULATORY_CARE_PROVIDER_SITE_OTHER): Payer: Medicare Other | Admitting: Internal Medicine

## 2018-03-30 VITALS — BP 128/66 | HR 76 | Temp 97.5°F | Resp 16 | Ht 64.0 in | Wt 201.6 lb

## 2018-03-30 DIAGNOSIS — M7062 Trochanteric bursitis, left hip: Secondary | ICD-10-CM | POA: Diagnosis not present

## 2018-03-30 DIAGNOSIS — M7061 Trochanteric bursitis, right hip: Secondary | ICD-10-CM | POA: Diagnosis not present

## 2018-03-30 DIAGNOSIS — M160 Bilateral primary osteoarthritis of hip: Secondary | ICD-10-CM

## 2018-03-30 DIAGNOSIS — B370 Candidal stomatitis: Secondary | ICD-10-CM | POA: Diagnosis not present

## 2018-03-30 MED ORDER — FLUCONAZOLE 100 MG PO TABS: ORAL_TABLET | ORAL | 1 refills | Status: DC

## 2018-03-30 MED ORDER — PREDNISONE 20 MG PO TABS: ORAL_TABLET | ORAL | 0 refills | Status: DC

## 2018-03-30 NOTE — Progress Notes (Signed)
Subjective:    Patient ID: Katie [name], female    DOB: 02-19-1941, 78 y.o.   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 She denies any injury or sciatica. She also feels like her torsemide is "not working" and desires to re-try Lasix. No sx's of CP, Orthopnea or PND.  Medication Sig  . acetaminophen (TYLENOL) 500 MG tablet Take 1,000 mg by mouth every 6 (six) hours as needed for moderate pain.   Marland Kitchen albuterol (PROAIR HFA) 108 (90 Base) MCG/ACT inhaler 2 inhalations 10-15 minutes apart evert 4 hours as needed for Asthma Flare (Patient taking differently: Inhale 2 puffs into the lungs every 4 (four) hours as needed for shortness of breath (asthma flare). )  . allopurinol (ZYLOPRIM) 300 MG tablet Take 300 mg by mouth daily.  Marland Kitchen aspirin EC 81 MG tablet Take 81 mg by mouth daily.  . cetirizine (ZYRTEC) 10 MG tablet Take 10 mg by mouth as needed.   . Cholecalciferol (VITAMIN D3) 5000 units CAPS Take 5,000 Units by mouth daily.   . digoxin (LANOXIN) 0.125 MG tablet Take 1 tablet (0.125 mg total) by mouth daily.  . Eyelid Cleansers (AVENOVA) 0.01 % SOLN APPLY A SMALL AMOUNT TO SKIN TWICE A DAY AS DIRECTED  . gabapentin (NEURONTIN) 600 MG tablet Take 300 mg by mouth 3 (three) times daily.  Marland Kitchen guaiFENesin (MUCINEX PO) Take 1 tablet by mouth daily as needed (runny nose).  Marland Kitchen ipratropium (ATROVENT) 0.06 % nasal spray Use 1 to 2 sprays each Nostril 2 to 3 x / day as needed  . isosorbide mononitrate (IMDUR) 30 MG 24 hr tablet Take 1 tablet daily for BP & Heart (Patient taking differently: Take 30 mg by mouth daily. )  . levothyroxine (SYNTHROID) 100 MCG tablet Take 1 tablet daily on an empty stomach with only water for 30 minutes.  . montelukast (SINGULAIR) 10 MG tablet Take 1 tablet daily for Allergies & Asthma  . pantoprazole (PROTONIX) 40 MG tablet Take 1 tablet (40 mg total) by mouth daily.  [name] Glycol-Propyl Glycol (SYSTANE OP) Place 1 drop into both eyes 4 (four) times daily as needed (dry eyes).   . potassium chloride SA (KLOR-CON M20) 20 MEQ tablet TAKE 2 TABLETS (40 MEQ TOTAL) BY MOUTH DAILY.  . pravastatin (PRAVACHOL) 40 MG tablet Take 40 mg by mouth daily.  . prednisoLONE acetate (PRED FORTE) 1 % ophthalmic suspension Place 1 drop into both eyes 2 (two) times daily.   . prochlorperazine (COMPAZINE) 5 MG tablet TAKE 1 TABLET BY MOUTH THREE TIMES A DAY AS NEEDED FOR VERTIGO OR NAUSEA (Patient taking differently: Take 5 mg by mouth daily as needed for nausea (vertigo). )  . rivaroxaban (XARELTO) 20 MG TABS tablet Take 1 tablet (20 mg total) by mouth daily with supper.  . torsemide (DEMADEX) 20 MG tablet TAKE 2 TABLETS (40 MG TOTAL) BY MOUTH 2 (TWO) TIMES DAILY.  Marland Kitchen triamcinolone cream (KENALOG) 0.1 % Apply 1 application topically daily.   . metoprolol succinate (TOPROL-XL) 50 MG 24 hr tablet Take 1 tablet (50 mg total) by mouth daily. Take with or immediately following a meal.  . Olopatadine HCl 0.2 % SOLN Place 1 drop into both eyes daily.   . predniSONE (DELTASONE) 20 MG tablet 2 tablets daily for 3 days, 1 tablet daily for 4 days.  Marland Kitchen  promethazine-dextromethorphan (PROMETHAZINE-DM) 6.25-15 MG/5ML syrup Take 5 mLs by mouth 4 (four) times daily as needed for cough.    Allergies  Allergen Reactions  . Amiodarone Other (See Comments)    PULMONARY TOXICITY  . Diovan [Valsartan] Other (See Comments)    HYPOTENSION  . Doxycycline Diarrhea and Other (See Comments)    VISUAL DISTURBANCE  . Flexeril [Cyclobenzaprine] Other (See Comments)    FATIGUE  . Keflex [Cephalexin] Diarrhea  . Verapamil Other (See Comments)    EDEMA  . Codeine Hives   Past Medical History:  Diagnosis Date  . AAA (abdominal aortic aneurysm) ([place])   . AICD (automatic cardioverter/defibrillator) present 11/10/2017  . Arthritis    "some in my knees" (11/10/2017)  . Atrial fibrillation ([place])   . CHF  (congestive heart failure) ([place])   . Chronic bronchitis ([place])   . COPD (chronic obstructive pulmonary disease) ([place])    'they say I don't; have a problem breathing though" (11/10/2017)  . Depression   . GERD (gastroesophageal reflux disease)   . Gout    "daily RX" (11/10/2017)  . History of hiatal hernia   . Hyperlipidemia   . Hypertension   . Hypothyroid   . Migraine headache    "hx; none since 1980s" (11/10/2017)  . Osteopenia   . Pneumonia    "~ 3 times" (11/10/2017)  . PVD (peripheral vascular disease) ([place])    Past Surgical History:  Procedure Laterality Date  . ABDOMINAL AORTIC ENDOVASCULAR STENT GRAFT N/A 04/11/2013   Procedure: ABDOMINAL AORTIC ENDOVASCULAR STENT GRAFT WITH RIGHT FEMORAL PATCH ANGIOPLASTY;  Surgeon: Mal Misty, MD;  Location: [facility];  Service: Vascular;  Laterality: N/A;  . BIV ICD INSERTION CRT-D  11/10/2017  . BIV ICD INSERTION CRT-D N/A 11/10/2017   Procedure: BIV ICD INSERTION CRT-D;  Surgeon: Evans Lance, MD;  Location: [facility];  Service: Cardiovascular;  Laterality: N/A;  . BREAST SURGERY     LEFT BREAST BIOPSY  . CARDIAC CATHETERIZATION    . CATARACT EXTRACTION W/ INTRAOCULAR LENS  IMPLANT, BILATERAL Bilateral   . CHOLECYSTECTOMY OPEN  1972  . COLONOSCOPY    . EYE SURGERY Bilateral    "bleeding in my eyes"  . FRACTURE SURGERY    . TIBIA FRACTURE SURGERY Left 1970s  . TONSILLECTOMY    . VARICOSE VEIN SURGERY Bilateral    "laser"   Review of Systems   10 point systems review negative except as above.    Objective:   Physical Exam  BP 128/66   Pulse 76   Temp (!) 97.5 F (36.4 C)   Resp 16   Ht 5\' 4"  (1.626 m)   Wt 201 lb 9.6 oz (91.4 kg)   BMI 34.60 kg/m   HEENT - WNL except noted oral thrush on soft palate and tonsillar pillars. Neck - supple.  Chest - Clear equal BS. Cor - Soft HS w sl iRRR w/o sig MGR. PP 1(+). 1-2+ pretibial edema.  MS- FROM w/o deformities. Tender low lumbar para spinous muscle spasm &  tenderness. Also tender over bilat Greater trochanteric bursa. Gait Nl. Neuro -  Nl w/o focal abnormalities.    Assessment & Plan:   1. Trochanteric bursitis of both hips  - predniSONE (DELTASONE) 20 MG tablet; 1 tab 3 x day for 3 days, then 1 tab 2 x day for 3 days, then 1 tab 1 x day for 5 days  Dispense: 20 tablet; Refill: 0  2. Primary osteoarthritis of both hips  -  predniSONE (DELTASONE) 20 MG tablet; 1 tab 3 x day for 3 days, then 1 tab 2 x day for 3 days, then 1 tab 1 x day for 5 days  Dispense: 20 tablet; Refill: 0  3. Oral thrush  - fluconazole (DIFLUCAN) 100 MG tablet; Take 1 tablet daily for Oral Thrush  Dispense: 14 tablet; Refill: 1

## 2018-04-02 ENCOUNTER — Encounter: Payer: Self-pay | Admitting: Internal Medicine

## 2018-04-03 ENCOUNTER — Ambulatory Visit (INDEPENDENT_AMBULATORY_CARE_PROVIDER_SITE_OTHER): Payer: Medicare Other | Admitting: Adult Health

## 2018-04-03 ENCOUNTER — Encounter: Payer: Self-pay | Admitting: Adult Health

## 2018-04-03 VITALS — BP 120/78 | HR 79 | Temp 97.5°F | Ht 64.0 in | Wt 202.0 lb

## 2018-04-03 DIAGNOSIS — M79675 Pain in left toe(s): Secondary | ICD-10-CM | POA: Diagnosis not present

## 2018-04-03 DIAGNOSIS — S99922A Unspecified injury of left foot, initial encounter: Secondary | ICD-10-CM

## 2018-04-03 DIAGNOSIS — W19XXXA Unspecified fall, initial encounter: Secondary | ICD-10-CM | POA: Diagnosis not present

## 2018-04-03 NOTE — Progress Notes (Signed)
Assessment and Plan:  Katie [name] was seen today for toe injury.  Diagnoses and all orders for this visit:  Soft tissue injury of toe of left foot, initial encounter Simple soft tissue injury without signs of infection; keep clean, discussed symptoms to monitor for signs of infection (redness, heat, swelling, pain/tenderness, streaking, etc) and she is to call immediately if these develop Keep wound clean and dry during the day, clean daily with warm soapy watery, topical abx ointment prior to dressing to limit adherence if needed Tetanus up to date She will follow up if needed for any concerning symptoms  Mechanical fall Low suspicion of fractures, she has appointment scheduled with ortho tomorrow and declines any imaging today.     Further disposition pending results of labs. Discussed med's effects and SE's.   Over 15 minutes of exam, counseling, chart review, and critical decision making was performed.   Future Appointments  Date Time Provider [place]  04/04/2018  9:00 AM Mcarthur Rossetti, MD [facility] None  05/15/2018  7:05 AM [facility] DEVICE REMOTES [facility]  05/16/2018  9:30 AM Liane Comber, NP [facility] None  08/23/2018  2:00 PM Liane Comber, NP [facility] None  02/27/2019  9:00 AM [name], MD [facility] None    ------------------------------------------------------------------------------------------------------------------   HPI BP 120/78   Pulse 79   Temp (!) 97.5 F (36.4 C)   Ht 5\' 4"  (1.626 m)   Wt 202 lb (91.6 kg)   SpO2 98%   BMI 34.67 kg/m   78 y.o.female presents for evaluation after she stubbed her L toe, fell on concrete yesterday on 04/02/2018 while helping her sister in a parking lot. She presents today with L great toe pain and scab to tip of toe, "took skin right off" per patient. She has been soaking in epsom salts last night. She reports some pain with ambulating. She reports she caught her fall with outstretched  L palm, has some bruising to base of palm today, denies wrist pain, edema, denies pain to wrist. She does have small abrasion to L cheek but without significant ecchymosis, edema. Denies headache, vision changes, nausea, dizziness. She has appointment scheduled with orthopedics tomorrow and declines xrays.   Has recent Tdap in 08/11/2017  Past Medical History:  Diagnosis Date  . AAA (abdominal aortic aneurysm) ([place])   . AICD (automatic cardioverter/defibrillator) present 11/10/2017  . Arthritis    "some in my knees" (11/10/2017)  . Atrial fibrillation ([place])   . CHF (congestive heart failure) ([place])   . Chronic bronchitis ([place])   . COPD (chronic obstructive pulmonary disease) ([place])    'they say I don't; have a problem breathing though" (11/10/2017)  . Depression   . GERD (gastroesophageal reflux disease)   . Gout    "daily RX" (11/10/2017)  . History of hiatal hernia   . Hyperlipidemia   . Hypertension   . Hypothyroid   . Migraine headache    "hx; none since 1980s" (11/10/2017)  . Osteopenia   . Pneumonia    "~ 3 times" (11/10/2017)  . PVD (peripheral vascular disease) ([facility])      Allergies  Allergen Reactions  . Amiodarone Other (See Comments)    PULMONARY TOXICITY  . Diovan [Valsartan] Other (See Comments)    HYPOTENSION  . Doxycycline Diarrhea and Other (See Comments)    VISUAL DISTURBANCE  . Flexeril [Cyclobenzaprine] Other (See Comments)    FATIGUE  . Keflex [Cephalexin] Diarrhea  . Verapamil Other (See Comments)    EDEMA  .  Codeine Hives    Current Outpatient Medications on File Prior to Visit  Medication Sig  . acetaminophen (TYLENOL) 500 MG tablet Take 1,000 mg by mouth every 6 (six) hours as needed for moderate pain.   Marland Kitchen albuterol (PROAIR HFA) 108 (90 Base) MCG/ACT inhaler 2 inhalations 10-15 minutes apart evert 4 hours as needed for Asthma Flare (Patient taking differently: Inhale 2 puffs into the lungs every 4 (four) hours as needed for shortness of breath (asthma  flare). )  . allopurinol (ZYLOPRIM) 300 MG tablet Take 300 mg by mouth daily.  Marland Kitchen aspirin EC 81 MG tablet Take 81 mg by mouth daily.  . cetirizine (ZYRTEC) 10 MG tablet Take 10 mg by mouth as needed.   . Cholecalciferol (VITAMIN D3) 5000 units CAPS Take 5,000 Units by mouth daily.   . digoxin (LANOXIN) 0.125 MG tablet Take 1 tablet (0.125 mg total) by mouth daily.  . Eyelid Cleansers (AVENOVA) 0.01 % SOLN APPLY A SMALL AMOUNT TO SKIN TWICE A DAY AS DIRECTED  . fluconazole (DIFLUCAN) 100 MG tablet Take 1 tablet daily for Oral Thrush  . gabapentin (NEURONTIN) 600 MG tablet Take 300 mg by mouth 3 (three) times daily.  Marland Kitchen guaiFENesin (MUCINEX PO) Take 1 tablet by mouth daily as needed (runny nose).  Marland Kitchen ipratropium (ATROVENT) 0.06 % nasal spray Use 1 to 2 sprays each Nostril 2 to 3 x / day as needed  . isosorbide mononitrate (IMDUR) 30 MG 24 hr tablet Take 1 tablet daily for BP & Heart (Patient taking differently: Take 30 mg by mouth daily. )  . levothyroxine (SYNTHROID) 100 MCG tablet Take 1 tablet daily on an empty stomach with only water for 30 minutes.  . montelukast (SINGULAIR) 10 MG tablet Take 1 tablet daily for Allergies & Asthma  . pantoprazole (PROTONIX) 40 MG tablet Take 1 tablet (40 mg total) by mouth daily.  [name] Glycol-Propyl Glycol (SYSTANE OP) Place 1 drop into both eyes 4 (four) times daily as needed (dry eyes).   . potassium chloride SA (KLOR-CON M20) 20 MEQ tablet TAKE 2 TABLETS (40 MEQ TOTAL) BY MOUTH DAILY.  . pravastatin (PRAVACHOL) 40 MG tablet Take 40 mg by mouth daily.  . prednisoLONE acetate (PRED FORTE) 1 % ophthalmic suspension Place 1 drop into both eyes 2 (two) times daily.   . predniSONE (DELTASONE) 20 MG tablet 1 tab 3 x day for 3 days, then 1 tab 2 x day for 3 days, then 1 tab 1 x day for 5 days  . prochlorperazine (COMPAZINE) 5 MG tablet TAKE 1 TABLET BY MOUTH THREE TIMES A DAY AS NEEDED FOR VERTIGO OR NAUSEA (Patient taking differently: Take 5 mg by mouth daily  as needed for nausea (vertigo). )  . rivaroxaban (XARELTO) 20 MG TABS tablet Take 1 tablet (20 mg total) by mouth daily with supper.  . torsemide (DEMADEX) 20 MG tablet TAKE 2 TABLETS (40 MG TOTAL) BY MOUTH 2 (TWO) TIMES DAILY.  Marland Kitchen triamcinolone cream (KENALOG) 0.1 % Apply 1 application topically daily.   . metoprolol succinate (TOPROL-XL) 50 MG 24 hr tablet Take 1 tablet (50 mg total) by mouth daily. Take with or immediately following a meal.   No current facility-administered medications on file prior to visit.     ROS: all negative except above.   Physical Exam:  BP 120/78   Pulse 79   Temp (!) 97.5 F (36.4 C)   Ht 5\' 4"  (1.626 m)   Wt 202 lb (91.6 kg)  SpO2 98%   BMI 34.67 kg/m   General Appearance: Well nourished, in no apparent distress. Eyes: PERRLA, EOMs, conjunctiva no swelling or erythema Sinuses: No Frontal/maxillary tenderness ENT/Mouth: Ext aud canals clear, TMs without erythema, bulging. No erythema, swelling, or exudate on post pharynx.  Tonsils not swollen or erythematous. Hearing normal.  Neck: Supple, thyroid normal.  Respiratory: Respiratory effort normal, BS equal bilaterally without rales, rhonchi, wheezing or stridor.  Cardio: RRR with no MRGs. Intact peripheral pulses without edema.  Abdomen: Soft, + BS.  Non tender, no guarding, rebound, hernias, masses. Lymphatics: Non tender without lymphadenopathy.  Musculoskeletal: Full ROM, 5/5 strength, normal gait. Full ROM through L toes, some tenderness with palpation of 1st MTP joint without palpable bony abnormality, without crepitus or effusion. NO notable swelling through the does have some ecchymosis through forefoot. Bruising at base of L palm without pain/tenderness, ROM intact, no pain in anatomical snuff box.  Skin: Warm, dry; she has scab with some fresh blood to tip of L toe, <1 cm area. Nail intact. NO fluctuation, erythema, skin flap, foreign bodies observed or palpated. Small abrasion to L cheek approx  1cm area without notable ecchymosis.  Neuro: Cranial nerves intact. Normal muscle tone, no cerebellar symptoms. Sensation intact.  Psych: Awake and oriented X 3, normal affect, Insight and Judgment appropriate.    Izora Ribas, NP 4:24 PM [facility]

## 2018-04-03 NOTE — Patient Instructions (Signed)
Monitor for cellulitis or infection  Call back with any redness, swelling, pus  Keep soaking regularly in epsom salts  Can apply topical antibiotic in between soaks    Cellulitis, Adult  Cellulitis is a skin infection. The infected area is usually warm, red, swollen, and tender. This condition occurs most often in the arms and lower legs. The infection can travel to the muscles, blood, and underlying tissue and become serious. It is very important to get treated for this condition. What are the causes? Cellulitis is caused by bacteria. The bacteria enter through a break in the skin, such as a cut, burn, insect bite, open sore, or crack. What increases the risk? This condition is more likely to occur in people who:  Have a weak body defense system (immune system).  Have open wounds on the skin, such as cuts, burns, bites, and scrapes. Bacteria can enter the body through these open wounds.  Are older than 77 years of age.  Have diabetes.  Have a type of long-lasting (chronic) liver disease (cirrhosis) or kidney disease.  Are obese.  Have a skin condition such as: ? Itchy rash (eczema). ? Slow movement of blood in the veins (venous stasis). ? Fluid buildup below the skin (edema).  Have had radiation therapy.  Use IV drugs. What are the signs or symptoms? Symptoms of this condition include:  Redness, streaking, or spotting on the skin.  Swollen area of the skin.  Tenderness or pain when an area of the skin is touched.  Warm skin.  A fever.  Chills.  Blisters. How is this diagnosed? This condition is diagnosed based on a medical history and physical exam. You may also have tests, including:  Blood tests.  Imaging tests. How is this treated? Treatment for this condition may include:  Medicines, such as antibiotic medicines or medicines to treat allergies (antihistamines).  Supportive care, such as rest and application of cold or warm cloths (compresses) to  the skin.  Hospital care, if the condition is severe. The infection usually starts to get better within 1-2 days of treatment. Follow these instructions at home:  Medicines  Take over-the-counter and prescription medicines only as told by your health care provider.  If you were prescribed an antibiotic medicine, take it as told by your health care provider. Do not stop taking the antibiotic even if you start to feel better. General instructions  Drink enough fluid to keep your urine pale yellow.  Do not touch or rub the infected area.  Raise (elevate) the infected area above the level of your heart while you are sitting or lying down.  Apply warm or cold compresses to the affected area as told by your health care provider.  Keep all follow-up visits as told by your health care provider. This is important. These visits let your health care provider make sure a more serious infection is not developing. Contact a health care provider if:  You have a fever.  Your symptoms do not begin to improve within 1-2 days of starting treatment.  Your bone or joint underneath the infected area becomes painful after the skin has healed.  Your infection returns in the same area or another area.  You notice a swollen bump in the infected area.  You develop new symptoms.  You have a general ill feeling (malaise) with muscle aches and pains. Get help right away if:  Your symptoms get worse.  You feel very sleepy.  You develop vomiting or diarrhea that persists.  You notice red streaks coming from the infected area.  Your red area gets larger or turns dark in color. These symptoms may represent a serious problem that is an emergency. Do not wait to see if the symptoms will go away. Get medical help right away. Call your local emergency services (911 in the [place] Do not drive yourself to the hospital. Summary  Cellulitis is a skin infection. This condition occurs most often in the arms  and lower legs.  Treatment for this condition may include medicines, such as antibiotic medicines or antihistamines.  Take over-the-counter and prescription medicines only as told by your health care provider. If you were prescribed an antibiotic medicine, do not stop taking the antibiotic even if you start to feel better.  Contact a health care provider if your symptoms do not begin to improve within 1-2 days of starting treatment or your symptoms get worse.  Keep all follow-up visits as told by your health care provider. This is important. These visits let your health care provider make sure that a more serious infection is not developing. This information is not intended to replace advice given to you by your health care provider. Make sure you discuss any questions you have with your health care provider. Document Released: 12/09/2004 Document Revised: 07/21/2017 Document Reviewed: 07/21/2017 Elsevier Interactive Patient Education  2019 [employer].

## 2018-04-04 ENCOUNTER — Ambulatory Visit (INDEPENDENT_AMBULATORY_CARE_PROVIDER_SITE_OTHER): Payer: Medicare Other | Admitting: Orthopaedic Surgery

## 2018-04-04 ENCOUNTER — Ambulatory Visit (INDEPENDENT_AMBULATORY_CARE_PROVIDER_SITE_OTHER): Payer: Self-pay

## 2018-04-04 ENCOUNTER — Encounter (INDEPENDENT_AMBULATORY_CARE_PROVIDER_SITE_OTHER): Payer: Self-pay | Admitting: Orthopaedic Surgery

## 2018-04-04 DIAGNOSIS — M79674 Pain in right toe(s): Secondary | ICD-10-CM

## 2018-04-04 DIAGNOSIS — S92411A Displaced fracture of proximal phalanx of right great toe, initial encounter for closed fracture: Secondary | ICD-10-CM

## 2018-04-04 NOTE — Progress Notes (Signed)
Office Visit Note   Patient: Katie [name]           Date of Birth: [birth year]           MRN: [id number] Visit Date: 04/04/2018              Requested by: [name], [address], [place] [address] PCP: [name], MD   Assessment & Plan: Visit Diagnoses:  1. Great toe pain, right   2. Closed displaced fracture of proximal phalanx of right great toe, initial encounter     Plan: Based on clinical exam and x-rays I do feel this is a nondisplaced fracture of her proximal phalanx of the great toe.  The best treatment will be a postop shoe and local wound care.  She will continue her soaks and place Bactroban ointment on the end of the toe daily.  We will see her back in 3 weeks to see how she is doing overall.  I would like 2 views of the right great toe at that visit.  All question concerns were answered and addressed.  Follow-Up Instructions: Return in about 3 years (around 04/04/2021).   Orders:  Orders Placed This Encounter  Procedures  . XR Toe Great Right   No orders of the defined types were placed in this encounter.     Procedures: No procedures performed   Clinical Data: No additional findings.   Subjective: Chief Complaint  Patient presents with  . Right Great Toe - Pain, Injury  The patient comes in today for evaluation treatment of her right great toe injury.  2 days ago she was on concrete taking the trash out and she kicked the toe against something.  She has been very bruised since then and has a small wound on the end of her toe that she is been treating with daily soaks and Neosporin.  She says it is painful to weight-bear but she is doing well otherwise.  She says is developed a significant amount of bruising.  HPI  Review of Systems She currently denies any headache, chest pain, shortness of breath, fever, chills, nausea, vomiting.  Objective: Vital Signs: There were no vitals taken for this visit.  Physical  Exam She is alert or x3 and in no acute distress Ortho Exam Examination of her right great toe shows significant bruising.  The nail is intact.  There is a small wound at the tuft of the toe but no exposed bone.  Clinically the toe at the MTP and IP joints are located.  There is significant swelling as well. Specialty Comments:  No specialty comments available.  Imaging: Xr Toe Great Right  Result Date: 04/04/2018 2 views of the right great toe show a nondisplaced fracture line through the proximal phalanx.  The joints around this are intact and congruent.    PMFS History: Patient Active Problem List   Diagnosis Date Noted  . Prediabetes 02/02/2018  . COPD (chronic obstructive pulmonary disease) with chronic bronchitis ([facility]) 02/02/2018  . Post-traumatic arthritis of ankle, left 11/29/2017  . Chronic systolic heart failure ([place]) 11/10/2017  . Shortness of breath   . Acute respiratory failure with hypoxia ([place]) 10/30/2017  . Aortic atherosclerosis ([place]) 10/17/2017  . Chronic systolic CHF (congestive heart failure) ([place]) 09/21/2017  . Depression 09/21/2017  . Bradycardia 09/21/2017  . PAH (pulmonary artery hypertension) ([facility]) 06/23/2017  . Esophageal dysphagia 05/15/2017  . Gout 03/20/2017  . Abnormal glucose 09/19/2015  . AAA (abdominal  aortic aneurysm) without rupture ([place]) 04/22/2015  . Morbid obesity ([place] [facility]) 08/03/2014  . PVD (peripheral vascular disease) with claudication ([place]) 10/16/2013  . CKD (chronic kidney disease) stage 3, GFR 30-59 ml/min ([facility]) 06/08/2013  . Hyperlipidemia, mixed 06/08/2013  . Encounter for general adult medical examination with abnormal findings 06/08/2013  . 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 Long term current use of anticoagulant therapy 04/23/2013  . Vitamin D deficiency 02/15/2013  . Hypothyroidism   . Osteopenia   . Congestive heart failure ([place]) 11/25/2008  . Migraine headache 11/22/2008  . Essential hypertension  11/22/2008  . Atherosclerosis of native coronary artery of native heart without angina pectoris 11/22/2008  . Atrial fibrillation ([place]) 11/22/2008  . Pulmonary emphysema ([place]) 11/22/2008  . Gastroesophageal reflux disease without esophagitis 11/22/2008   Past Medical History:  Diagnosis Date  . AAA (abdominal aortic aneurysm) ([place])   . AICD (automatic cardioverter/defibrillator) present 11/10/2017  . Arthritis    "some in my knees" (11/10/2017)  . Atrial fibrillation ([place])   . CHF (congestive heart failure) ([place])   . Chronic bronchitis ([place])   . COPD (chronic obstructive pulmonary disease) ([place])    'they say I don't; have a problem breathing though" (11/10/2017)  . Depression   . GERD (gastroesophageal reflux disease)   . Gout    "daily RX" (11/10/2017)  . History of hiatal hernia   . Hyperlipidemia   . Hypertension   . Hypothyroid   . Migraine headache    "hx; none since 1980s" (11/10/2017)  . Osteopenia   . Pneumonia    "~ 3 times" (11/10/2017)  . PVD (peripheral vascular disease) ([facility])     Family History  Problem Relation Age of Onset  . Cirrhosis Mother   . Cancer Mother 66       PANCREAS  . Heart defect Sister   . Breast cancer Sister        age 64  . Heart disease Sister   . Stroke Sister   . Alcohol abuse Father   . Depression Father   . Hypertension Brother   . Hyperlipidemia Son   . Heart disease Daughter     Past Surgical History:  Procedure Laterality Date  . ABDOMINAL AORTIC ENDOVASCULAR STENT GRAFT N/A 04/11/2013   Procedure: ABDOMINAL AORTIC ENDOVASCULAR STENT GRAFT WITH RIGHT FEMORAL PATCH ANGIOPLASTY;  Surgeon: Mal Misty, MD;  Location: [facility];  Service: Vascular;  Laterality: N/A;  . BIV ICD INSERTION CRT-D  11/10/2017  . BIV ICD INSERTION CRT-D N/A 11/10/2017   Procedure: BIV ICD INSERTION CRT-D;  Surgeon: Evans Lance, MD;  Location: [facility];  Service: Cardiovascular;  Laterality: N/A;  . BREAST SURGERY     LEFT BREAST BIOPSY  .  CARDIAC CATHETERIZATION    . CATARACT EXTRACTION W/ INTRAOCULAR LENS  IMPLANT, BILATERAL Bilateral   . CHOLECYSTECTOMY OPEN  1972  . COLONOSCOPY    . EYE SURGERY Bilateral    "bleeding in my eyes"  . FRACTURE SURGERY    . TIBIA FRACTURE SURGERY Left 1970s  . TONSILLECTOMY    . VARICOSE VEIN SURGERY Bilateral    "laser"   Social History   Occupational History  . Not on file  Tobacco Use  . Smoking status: Former Smoker    Packs/day: 2.00    Years: 54.00    Pack years: 108.00    Types: Cigarettes    Last attempt to quit: 08/10/2002    Years since quitting: 15.6  .  Smokeless tobacco: Never Used  Substance and Sexual Activity  . Alcohol use: Yes    Comment: 11/10/2017 "glass of wine q 3-4 months"  . Drug use: Never  . Sexual activity: Yes    Birth control/protection: Post-menopausal

## 2018-04-12 ENCOUNTER — Other Ambulatory Visit: Payer: Self-pay | Admitting: Internal Medicine

## 2018-04-12 DIAGNOSIS — I5022 Chronic systolic (congestive) heart failure: Secondary | ICD-10-CM

## 2018-04-12 DIAGNOSIS — Z7901 Long term (current) use of anticoagulants: Secondary | ICD-10-CM

## 2018-04-12 DIAGNOSIS — I10 Essential (primary) hypertension: Secondary | ICD-10-CM

## 2018-04-13 ENCOUNTER — Other Ambulatory Visit: Payer: Self-pay | Admitting: Internal Medicine

## 2018-04-25 ENCOUNTER — Ambulatory Visit (INDEPENDENT_AMBULATORY_CARE_PROVIDER_SITE_OTHER): Payer: Medicare Other | Admitting: Physician Assistant

## 2018-04-25 ENCOUNTER — Ambulatory Visit (INDEPENDENT_AMBULATORY_CARE_PROVIDER_SITE_OTHER): Payer: Medicare Other

## 2018-04-25 ENCOUNTER — Encounter (INDEPENDENT_AMBULATORY_CARE_PROVIDER_SITE_OTHER): Payer: Self-pay | Admitting: Orthopaedic Surgery

## 2018-04-25 DIAGNOSIS — M79674 Pain in right toe(s): Secondary | ICD-10-CM

## 2018-04-25 DIAGNOSIS — S92411D Displaced fracture of proximal phalanx of right great toe, subsequent encounter for fracture with routine healing: Secondary | ICD-10-CM

## 2018-04-25 NOTE — Progress Notes (Signed)
Office Visit Note   Patient: Katie [name]           Date of Birth: 12/25/1940           MRN: [id number] Visit Date: 04/25/2018              Requested by: [name], [address], [place] [address] PCP: [name], MD   Assessment & Plan: Visit Diagnoses:  1. Great toe pain, right   2. Closed displaced fracture of proximal phalanx of right great toe with routine healing, subsequent encounter     Plan: She will continue the postop shoe on the right foot.  Elevation encouraged.  We will see her back in 1 month we will obtain 3 views of the right great toe at that time.  Most likely at that time we will also inject her left ankle which she has known arthritis of the she is requesting injection but is too early to do today.  Follow-Up Instructions: Return in about 4 weeks (around 05/23/2018).   Orders:  Orders Placed This Encounter  Procedures  . XR Toe Great Right   No orders of the defined types were placed in this encounter.     Procedures: No procedures performed   Clinical Data: No additional findings.   Subjective: Chief Complaint  Patient presents with  . Right Foot - Pain    HPI Katie [name] returns today 3 weeks and 3 days status post right great toe fracture.  She is wearing a postop shoe.  She is overall doing okay but still having a lot of pain in the great toe.  She is having significant pain in the toe that is keeping her awake at night.  She is had no new injuries otherwise. Review of Systems See HPI otherwise negative  Objective: Vital Signs: There were no vitals taken for this visit.  Physical Exam Constitutional:      Appearance: She is not ill-appearing or diaphoretic.  Neurological:     Mental Status: She is alert.     Ortho Exam Right great toe she has tenderness over the proximal phalanx.  Her nail is intact appears to have a slight hematoma under.  No gross deformity of the toe.  Remainder foot is  nontender.  Dorsal pedal pulses intact. Specialty Comments:  No specialty comments available.  Imaging: Xr Toe Great Right  Result Date: 04/25/2018 Right great toe 2 views: Proximal phalanx fracture maintenance overall good position alignment.  No significant callus formation.    PMFS History: Patient Active Problem List   Diagnosis Date Noted  . Prediabetes 02/02/2018  . COPD (chronic obstructive pulmonary disease) with chronic bronchitis ([facility]) 02/02/2018  . Post-traumatic arthritis of ankle, left 11/29/2017  . Chronic systolic heart failure ([place]) 11/10/2017  . Shortness of breath   . Acute respiratory failure with hypoxia ([place]) 10/30/2017  . Aortic atherosclerosis ([place]) 10/17/2017  . Chronic systolic CHF (congestive heart failure) ([place]) 09/21/2017  . Depression 09/21/2017  . Bradycardia 09/21/2017  . PAH (pulmonary artery hypertension) ([place]) 06/23/2017  . Esophageal dysphagia 05/15/2017  . Gout 03/20/2017  . Abnormal glucose 09/19/2015  . AAA (abdominal aortic aneurysm) without rupture ([place]) 04/22/2015  . Morbid obesity ([place]) 08/03/2014  . PVD (peripheral vascular disease) with claudication ([place]) 10/16/2013  . CKD (chronic kidney disease) stage 3, GFR 30-59 ml/min (H[facility]) 06/08/2013  . Hyperlipidemia, mixed 06/08/2013  . Encounter for general adult medical examination with abnormal findings 06/08/2013  . Pulmonary  Fibrosis sequellae of Amiodarone 06/08/2013  . Long term current use of anticoagulant therapy 04/23/2013  . Vitamin D deficiency 02/15/2013  . Hypothyroidism   . Osteopenia   . Congestive heart failure ([place]) 11/25/2008  . Migraine headache 11/22/2008  . Essential hypertension 11/22/2008  . Atherosclerosis of native coronary artery of native heart without angina pectoris 11/22/2008  . Atrial fibrillation ([facility] [place]) 11/22/2008  . Pulmonary emphysema ([place]) 11/22/2008  . Gastroesophageal reflux disease without esophagitis 11/22/2008   Past Medical History:  Diagnosis  Date  . AAA (abdominal aortic aneurysm) ([place])   . AICD (automatic cardioverter/defibrillator) present 11/10/2017  . Arthritis    "some in my knees" (11/10/2017)  . Atrial fibrillation ([place])   . CHF (congestive heart failure) ([place])   . Chronic bronchitis ([place])   . COPD (chronic obstructive pulmonary disease) ([place])    'they say I don't; have a problem breathing though" (11/10/2017)  . Depression   . GERD (gastroesophageal reflux disease)   . Gout    "daily RX" (11/10/2017)  . History of hiatal hernia   . Hyperlipidemia   . Hypertension   . Hypothyroid   . Migraine headache    "hx; none since 1980s" (11/10/2017)  . Osteopenia   . Pneumonia    "~ 3 times" (11/10/2017)  . PVD (peripheral vascular disease) ([facility])     Family History  Problem Relation Age of Onset  . Cirrhosis Mother   . Cancer Mother 4       PANCREAS  . Heart defect Sister   . Breast cancer Sister        age 39  . Heart disease Sister   . Stroke Sister   . Alcohol abuse Father   . Depression Father   . Hypertension Brother   . Hyperlipidemia Son   . Heart disease Daughter     Past Surgical History:  Procedure Laterality Date  . ABDOMINAL AORTIC ENDOVASCULAR STENT GRAFT N/A 04/11/2013   Procedure: ABDOMINAL AORTIC ENDOVASCULAR STENT GRAFT WITH RIGHT FEMORAL PATCH ANGIOPLASTY;  Surgeon: Mal Misty, MD;  Location: [facility];  Service: Vascular;  Laterality: N/A;  . BIV ICD INSERTION CRT-D  11/10/2017  . BIV ICD INSERTION CRT-D N/A 11/10/2017   Procedure: BIV ICD INSERTION CRT-D;  Surgeon: Evans Lance, MD;  Location: [facility];  Service: Cardiovascular;  Laterality: N/A;  . BREAST SURGERY     LEFT BREAST BIOPSY  . CARDIAC CATHETERIZATION    . CATARACT EXTRACTION W/ INTRAOCULAR LENS  IMPLANT, BILATERAL Bilateral   . CHOLECYSTECTOMY OPEN  1972  . COLONOSCOPY    . EYE SURGERY Bilateral    "bleeding in my eyes"  . FRACTURE SURGERY    . TIBIA FRACTURE SURGERY Left 1970s  . TONSILLECTOMY    . VARICOSE  VEIN SURGERY Bilateral    "laser"   Social History   Occupational History  . Not on file  Tobacco Use  . Smoking status: Former Smoker    Packs/day: 2.00    Years: 54.00    Pack years: 108.00    Types: Cigarettes    Last attempt to quit: 08/10/2002    Years since quitting: 15.7  . Smokeless tobacco: Never Used  Substance and Sexual Activity  . Alcohol use: Yes    Comment: 11/10/2017 "glass of wine q 3-4 months"  . Drug use: Never  . Sexual activity: Yes    Birth control/protection: Post-menopausal

## 2018-04-27 IMAGING — MR MR LUMBAR SPINE W/O CM
4 of 5 series · 18 of 48 positions shown · non-contrast
Comparison: CT abdomen and pelvis April 22, 2015

CLINICAL DATA: Bilateral lower extremity neuropathic pain and
weakness.

EXAM:
MRI LUMBAR SPINE WITHOUT CONTRAST
TECHNIQUE: Multiplanar, multisequence MR imaging of the lumbar spine was
performed. No intravenous contrast was administered.

[Series 3: T2 · sagittal · 4.0mm · 0.55mm/px · 5 of 12 slices shown (1 of 2)]
[im 1/12]
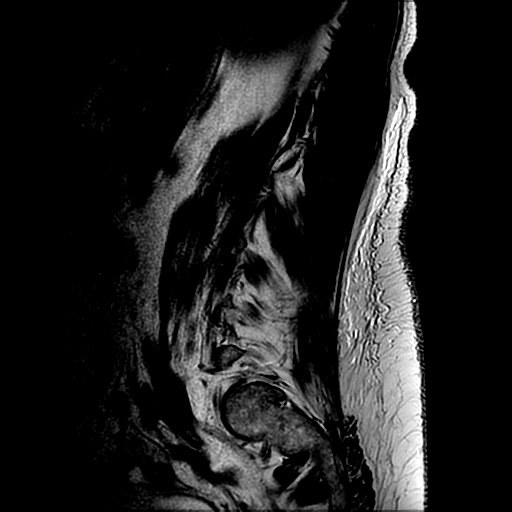
[im 3/12]
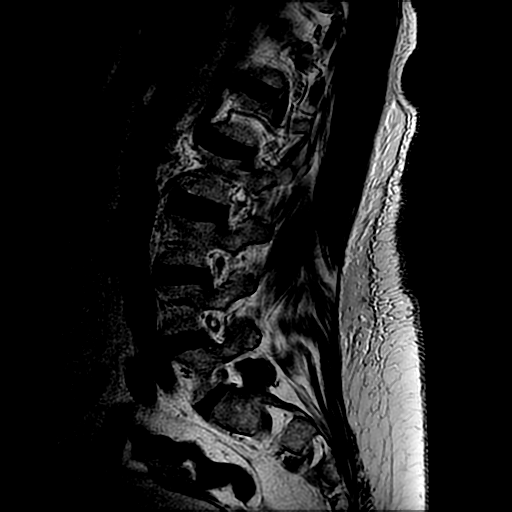
[im 6/12]
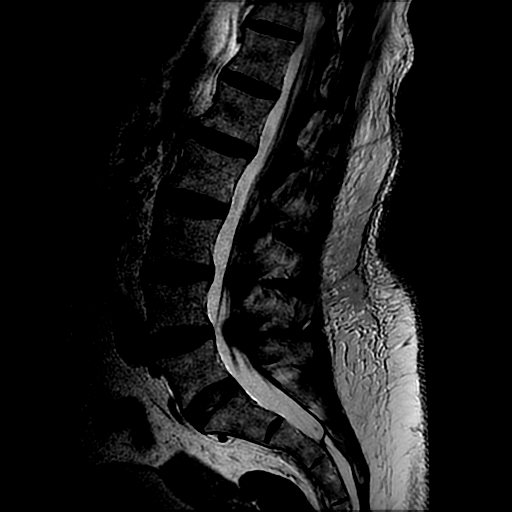
[im 9/12]
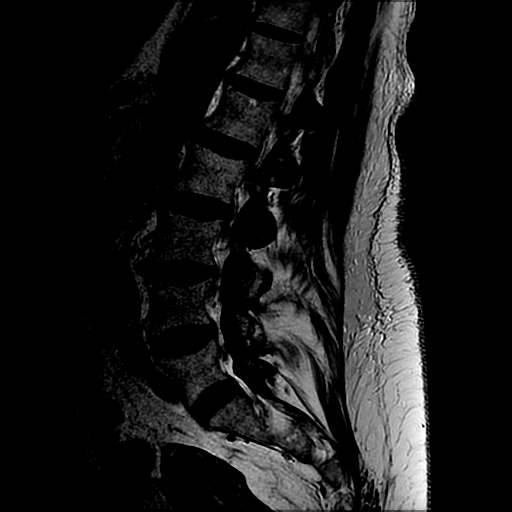
[im 12/12]
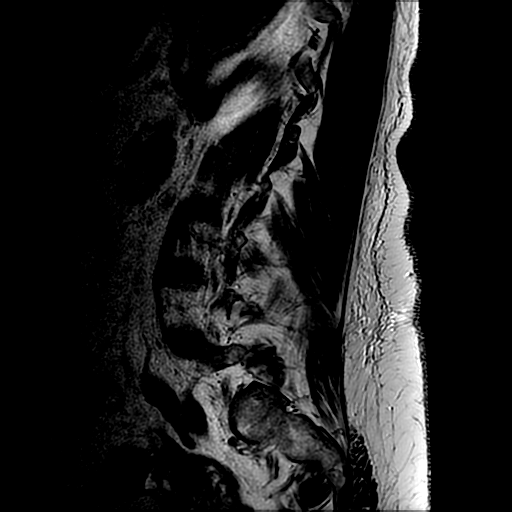

[Series 4: T1 · sagittal · 4.0mm · 0.55mm/px · 3 of 12 slices shown (1 of 2)]
[im 1/12]
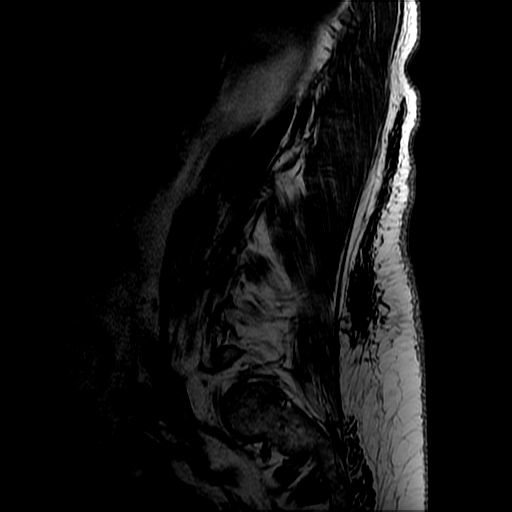
[im 6/12]
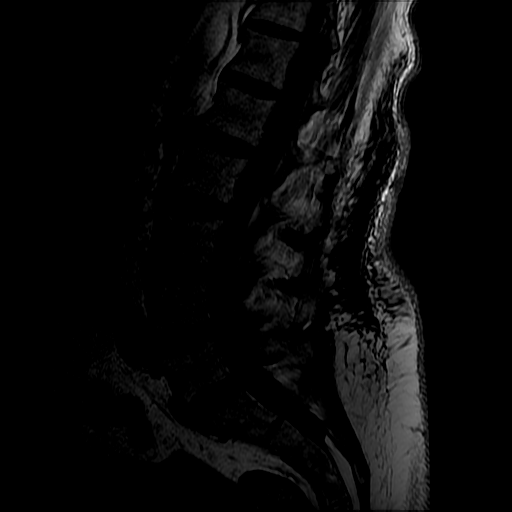
[im 12/12]
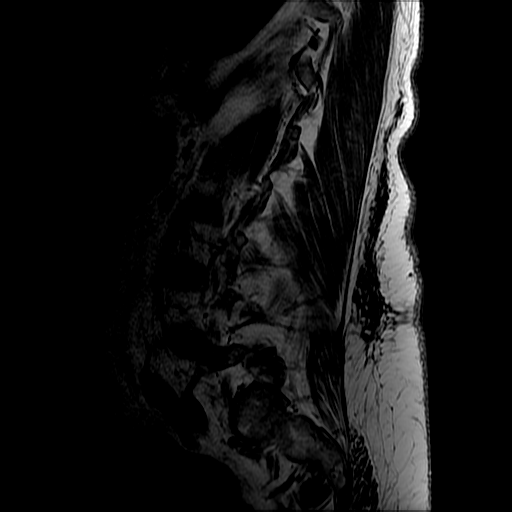

[Series 6: T2 · axial · 4.0mm · 0.39mm/px · z∈[-144,+13]mm · 7 of 33 slices shown (2 of 2)]
[im 3/33]
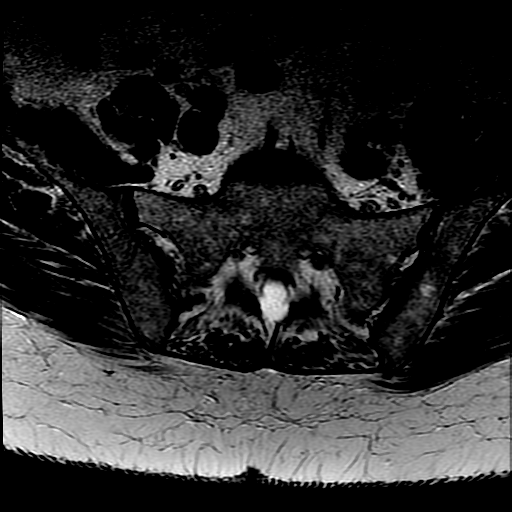
[im 5/33]
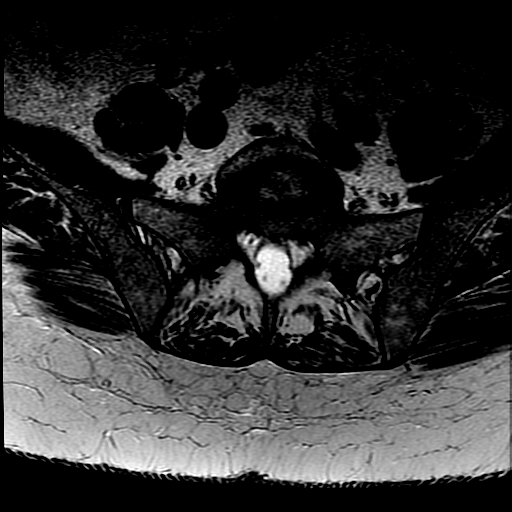
[im 7/33]
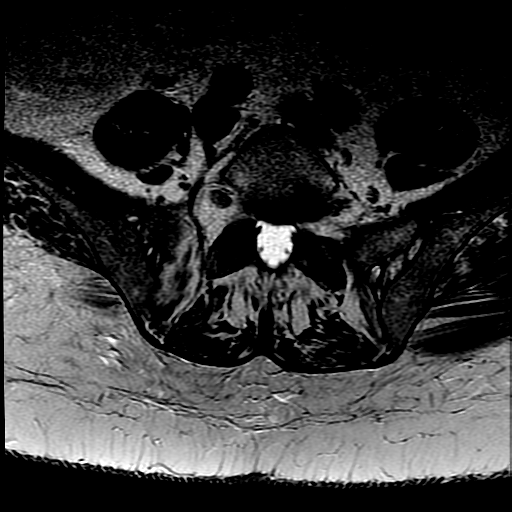
[im 11/33]
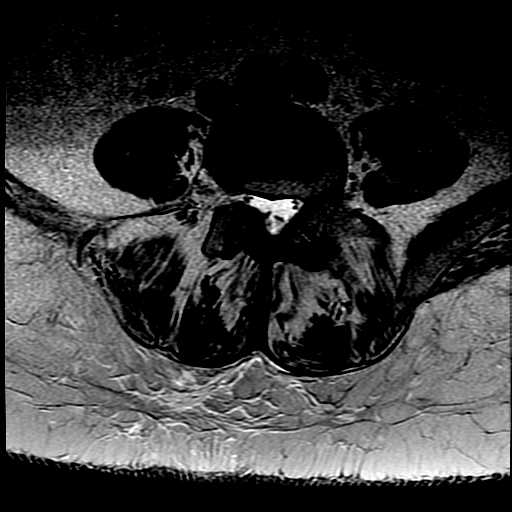
[im 15/33]
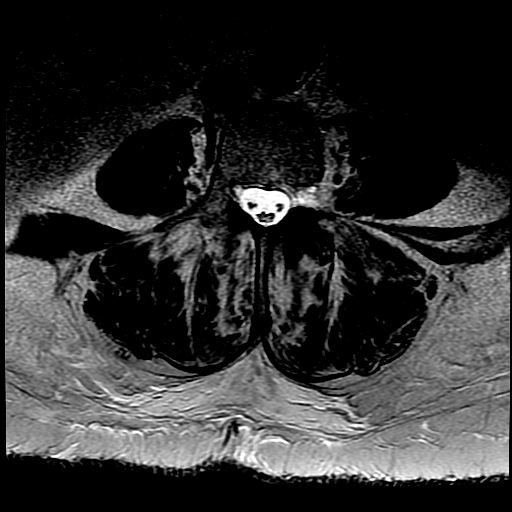
[im 18/33]
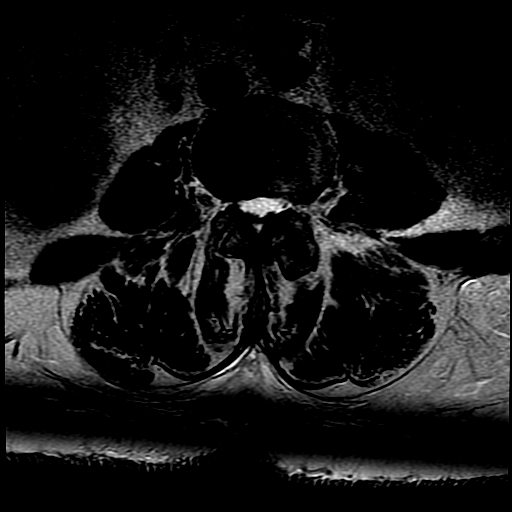
[im 28/33]
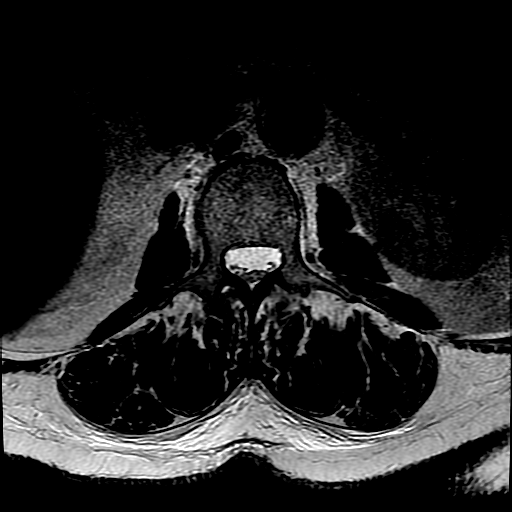

[Series 7: T1 · axial · 4.0mm · 0.39mm/px · z∈[-134,+13]mm · 3 of 33 slices shown (2 of 2)]
[im 5/33]
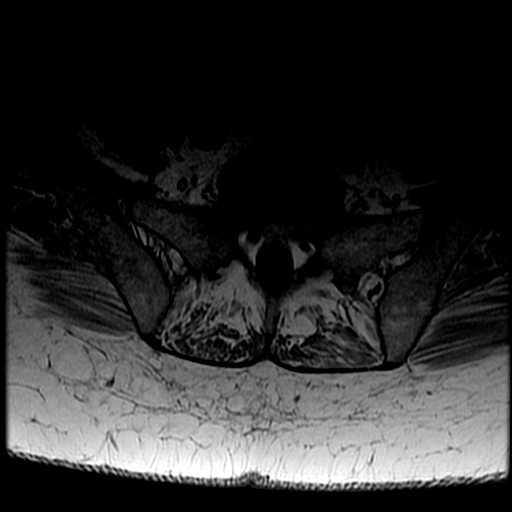
[im 18/33]
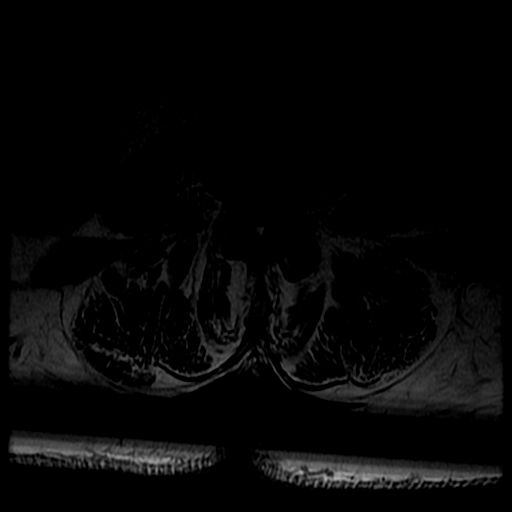
[im 28/33]
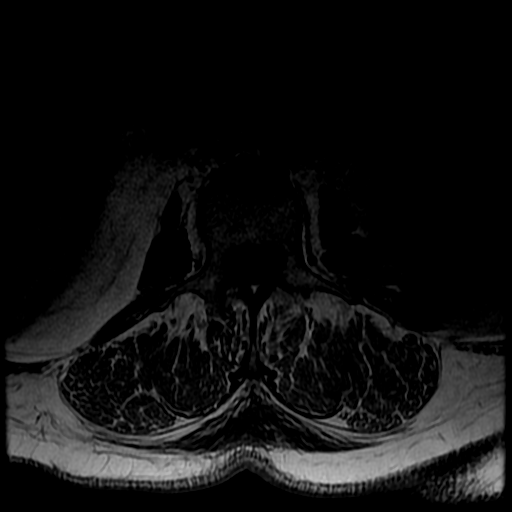

[18 of 48 positions shown; findings below may reference images not displayed]

FINDINGS: SEGMENTATION: For the purposes of this report, the last well-formed
intervertebral disc will be described as L5-S1.

ALIGNMENT: Maintenance of the lumbar lordosis. No malalignment.

VERTEBRAE:Vertebral bodies are intact. Intervertebral discs
demonstrate normal morphology, decreased T2 signal within the
included thoracic and lumbar discs L4-5 compatible with mild
desiccation. Multilevel minimal chronic discogenic endplate changes,
less than expected. No abnormal or acute bone marrow signal.

CONUS MEDULLARIS: Conus medullaris terminates at T12-L1 and
demonstrates normal morphology and signal characteristics. Cauda
equina is normal. Subcentimeter bilateral S2 Tarlov cysts.
Subcentimeter included thoracic perineural cyst.

PARASPINAL AND SOFT TISSUES: Included prevertebral and paraspinal
soft tissues are nonacute. Aortoiliac endovascular stent graft
better characterized on prior CT. Mild symmetric paraspinal muscle
atrophy. Heterogeneous uterus, possible leiomyomata.

DISC LEVELS:

L1-2: Annular bulging asymmetric to the RIGHT. No canal stenosis or
neural foraminal narrowing.

L2-3: No disc bulge, canal stenosis nor neural foraminal narrowing.
Mild facet arthropathy.

L3-4: Annular bulging. Moderate LEFT facet arthropathy and
ligamentum flavum redundancy. Mild canal stenosis, no neural
foraminal narrowing.

L4-5: Annular bulging. Moderate LEFT greater than RIGHT facet
arthropathy and ligamentum flavum redundancy resulting in mild canal
stenosis, no neural foraminal narrowing.

L5-S1: No disc bulge. Mild RIGHT, severe LEFT facet arthropathy. No
canal stenosis or neural foraminal narrowing.
IMPRESSION: Less than expected for age degenerative change of the lumbar spine.
Mild canal stenosis L3-4 and L4-5. No neural foraminal narrowing.

## 2018-04-27 IMAGING — CR DG CHEST 1V PORT
1 series · 1 of 1 positions shown · non-contrast
Comparison: 07/07/2016

CLINICAL DATA: Patient coughing up blood this AM. Hx of AFIB.

EXAM:
PORTABLE CHEST 1 VIEW

[AP]
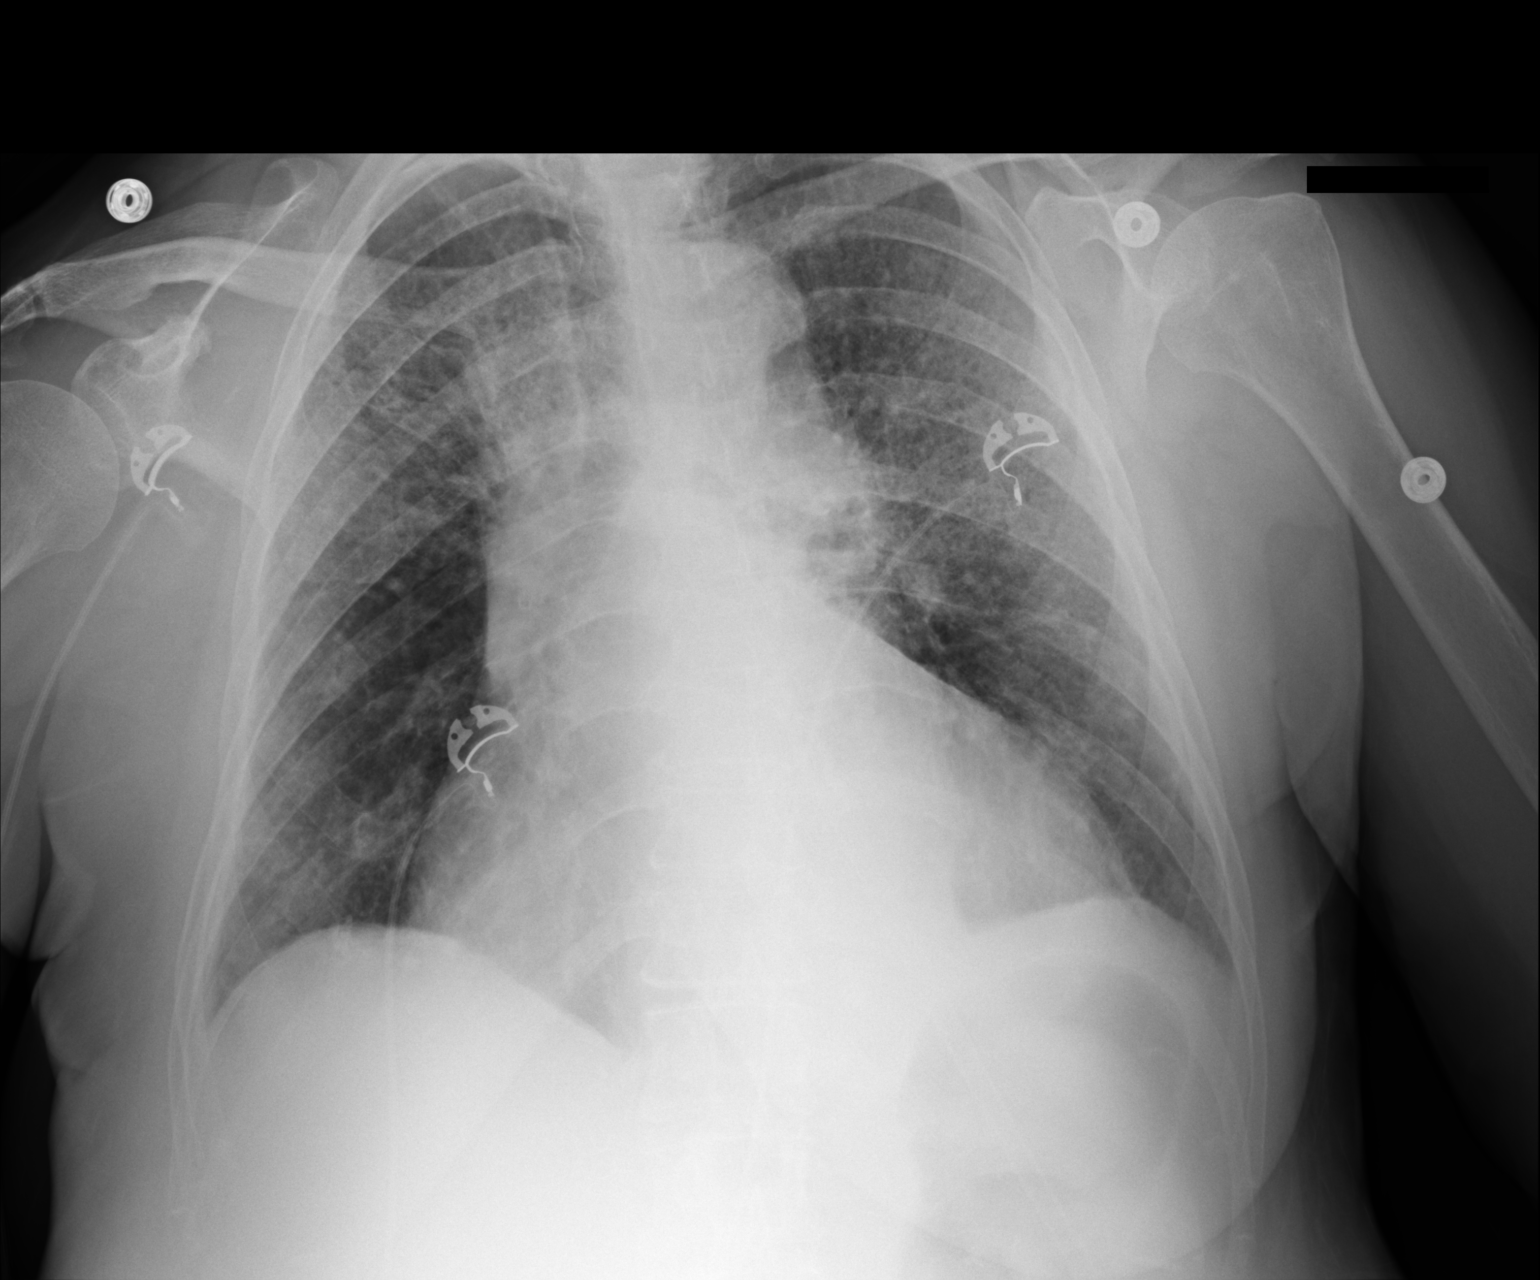

[1 of 1 positions shown; findings below may reference images not displayed]

FINDINGS: The heart is enlarged and stable in configuration. There has been
development of bilateral upper lobe airspace filling opacities.
Findings are consistent with hemorrhage or infectious infiltrate.
The appearance is atypical for edema.
IMPRESSION: New bilateral upper lobe airspace filling opacities consistent with
hemorrhage and/or infection.

Stable cardiomegaly.

## 2018-05-04 ENCOUNTER — Ambulatory Visit (INDEPENDENT_AMBULATORY_CARE_PROVIDER_SITE_OTHER): Payer: Medicare Other | Admitting: Physician Assistant

## 2018-05-04 ENCOUNTER — Encounter: Payer: Self-pay | Admitting: Physician Assistant

## 2018-05-04 VITALS — BP 124/70 | HR 88 | Temp 98.3°F | Ht 64.0 in | Wt 208.0 lb

## 2018-05-04 DIAGNOSIS — E039 Hypothyroidism, unspecified: Secondary | ICD-10-CM | POA: Diagnosis not present

## 2018-05-04 DIAGNOSIS — I5042 Chronic combined systolic (congestive) and diastolic (congestive) heart failure: Secondary | ICD-10-CM

## 2018-05-04 DIAGNOSIS — R351 Nocturia: Secondary | ICD-10-CM

## 2018-05-04 DIAGNOSIS — R0600 Dyspnea, unspecified: Secondary | ICD-10-CM

## 2018-05-04 DIAGNOSIS — N183 Chronic kidney disease, stage 3 unspecified: Secondary | ICD-10-CM

## 2018-05-04 NOTE — Patient Instructions (Addendum)
For 2-3 days increase torsemide to 80mg  to 4 pills in the morning and continue the 2 pills at night Increase your potassium during the time to 4 pills a day total   After 2 days if your weight is going down, go down to 3 pills in the morning for another 3 days and 3 pills of the potassium.    If your weight is back to normal than go back to the 2 pills in the AM and 2 pills afternoon of the torsemide.   Follow up 1 week here and follow up with heart doctor.   Monitor your weight and BP Cut back on the fluid pills if you get dizzy, your weight goes down more than 5 lbs at a time or you have severe muscle cramps.  Go to the ER if any chest pain, shortness of breath, nausea, dizziness, severe HA, changes vision/speech  Cut back on your fluids to 1.5 L a day  Cut back on sugar- stop sweet tea and cut back on sodium   Do the following things EVERYDAY: 1) Weigh yourself in the morning before breakfast or at the same time every day. Write it down and keep it in a log. 2) Take your medicines as prescribed 3) Eat low salt foods-Limit salt (sodium) to 2000 mg per day. Best thing to do is avoid processed foods.   4) Stay as active as you can everyday 5) Limit all fluids for the day to less than 1.5 liters  Call your doctor if:  Anytime you have any of the following symptoms:  1) 2 pound weight gain in 24 hours or 5 pounds in 1 week  2) shortness of breath, with or without a dry hacking cough  3) swelling in the hands, LEGs, feet or stomach  4) if you have to sleep on extra pillows at night in order to breathe. 5) after laying down at night for 20-30 mins, you wake up short of breath.   These can all be signs of fluid overload.    Heart Failure Heart failure means your heart has trouble pumping blood. This makes it hard for your body to work well. Heart failure is usually a long-term (chronic) condition. You must take good care of yourself and follow your doctor's treatment plan. Follow  these instructions at home:  Take your heart medicine as told by your doctor. ? Do not stop taking medicine unless your doctor tells you to. ? Do not skip any dose of medicine. ? Refill your medicines before they run out. ? Take other medicines only as told by your doctor or pharmacist.  Stay active if told by your doctor. The elderly and people with severe heart failure should talk with a doctor about physical activity.  Eat heart-healthy foods. Choose foods that are without trans fat and are low in saturated fat, cholesterol, and salt (sodium). This includes fresh or frozen fruits and vegetables, fish, lean meats, fat-free or low-fat dairy foods, whole grains, and high-fiber foods. Lentils and dried peas and beans (legumes) are also good choices.  Limit salt if told by your doctor.  Cook in a healthy way. Roast, grill, broil, bake, poach, steam, or stir-fry foods.  Limit fluids as told by your doctor.  Weigh yourself every morning. Do this after you pee (urinate) and before you eat breakfast. Write down your weight to give to your doctor.  Take your blood pressure and write it down if your doctor tells you to.  Ask your  doctor how to check your pulse. Check your pulse as told.  Lose weight if told by your doctor.  Stop smoking or chewing tobacco. Do not use gum or patches that help you quit without your doctor's approval.  Schedule and go to doctor visits as told.  Nonpregnant women should have no more than 1 drink a day. Men should have no more than 2 drinks a day. Talk to your doctor about drinking alcohol.  Stop illegal drug use.  Stay current with shots (immunizations).  Manage your health conditions as told by your doctor.  Learn to manage your stress.  Rest when you are tired.  If it is really hot outside: ? Avoid intense activities. ? Use air conditioning or fans, or get in a cooler place. ? Avoid caffeine and alcohol. ? Wear loose-fitting, lightweight, and  light-colored clothing.  If it is really cold outside: ? Avoid intense activities. ? Layer your clothing. ? Wear mittens or gloves, a hat, and a scarf when going outside. ? Avoid alcohol.  Learn about heart failure and get support as needed.  Get help to maintain or improve your quality of life and your ability to care for yourself as needed. Contact a doctor if:  You gain weight quickly.  You are more short of breath than usual.  You cannot do your normal activities.  You tire easily.  You cough more than normal, especially with activity.  You have any or more puffiness (swelling) in areas such as your hands, feet, ankles, or belly (abdomen).  You cannot sleep because it is hard to breathe.  You feel like your heart is beating fast (palpitations).  You get dizzy or light-headed when you stand up. Get help right away if:  You have trouble breathing.  There is a change in mental status, such as becoming less alert or not being able to focus.  You have chest pain or discomfort.  You faint. This information is not intended to replace advice given to you by your health care provider. Make sure you discuss any questions you have with your health care provider. Document Released: 12/09/2007 Document Revised: 08/07/2015 Document Reviewed: 04/17/2012 Elsevier Interactive Patient Education  2017 [employer].

## 2018-05-04 NOTE — Progress Notes (Signed)
Subjective:    Patient ID: Katie [name], female    DOB: 1940/12/28, 78 y.o.   MRN: [id number]  HPI 78 y.o. WF with history of systolic heart failure, AAA s/p repair, afib, CAD, COPD presents with swelling.  She normally weighs every other day, noticed her weight went up this Monday. She is up 7 lbs from her ideal weight. She is on demadex 20 mg 2 in the AM and 2 in the afternoon. She was on prednisone about 4 weeks ago. She is on 20 meq potassium 2 a day.    She has more SOB, no cough/wheezing, possible PND, waking every 2-3 hours, nocturia x 2, 2 pillows which is unchanged. She has more edema in her legs and feels her AB was hard last night. She has been on imdur x August. She is on metoprol XL  BMI is Body mass index is 35.7 kg/m., she is working on diet and exercise. Wt Readings from Last 3 Encounters:  05/04/18 208 lb (94.3 kg)  04/03/18 202 lb (91.6 kg)  03/30/18 201 lb 9.6 oz (91.4 kg)     Lab Results  Component Value Date   GFRNONAA 36 (L) 02/02/2018    Lab Results  Component Value Date   WBC 7.2 02/02/2018   HGB 15.6 (H) 02/02/2018   HCT 46.3 (H) 02/02/2018   MCV 89.4 02/02/2018   PLT 190 02/02/2018   Lab Results  Component Value Date   ALT 10 02/02/2018   AST 18 02/02/2018   ALKPHOS 36 (L) 10/31/2017   BILITOT 0.6 02/02/2018    Blood pressure 124/70, pulse 88, temperature 98.3 F (36.8 C), height 5\' 4"  (1.626 m), weight 208 lb (94.3 kg), SpO2 99 %.  Medications Current Outpatient Medications on File Prior to Visit  Medication Sig  . acetaminophen (TYLENOL) 500 MG tablet Take 1,000 mg by mouth every 6 (six) hours as needed for moderate pain.   Marland Kitchen albuterol (PROAIR HFA) 108 (90 Base) MCG/ACT inhaler 2 inhalations 10-15 minutes apart evert 4 hours as needed for Asthma Flare (Patient taking differently: Inhale 2 puffs into the lungs every 4 (four) hours as needed for shortness of breath (asthma flare). )  . allopurinol (ZYLOPRIM) 300 MG tablet TAKE 1 TABLET BY  MOUTH EVERY DAY  . aspirin EC 81 MG tablet Take 81 mg by mouth daily.  . cetirizine (ZYRTEC) 10 MG tablet Take 10 mg by mouth as needed.   . Cholecalciferol (VITAMIN D3) 5000 units CAPS Take 5,000 Units by mouth daily.   . digoxin (LANOXIN) 0.125 MG tablet Take 1 tablet (0.125 mg total) by mouth daily.  . Eyelid Cleansers (AVENOVA) 0.01 % SOLN APPLY A SMALL AMOUNT TO SKIN TWICE A DAY AS DIRECTED  . gabapentin (NEURONTIN) 600 MG tablet Take 300 mg by mouth 3 (three) times daily.  Marland Kitchen guaiFENesin (MUCINEX PO) Take 1 tablet by mouth daily as needed (runny nose).  Marland Kitchen ipratropium (ATROVENT) 0.06 % nasal spray Use 1 to 2 sprays each Nostril 2 to 3 x / day as needed  . isosorbide mononitrate (IMDUR) 30 MG 24 hr tablet TAKE 1 TABLET DAILY FOR BP & HEART  . levothyroxine (SYNTHROID) 100 MCG tablet Take 1 tablet daily on an empty stomach with only water for 30 minutes.  . montelukast (SINGULAIR) 10 MG tablet Take 1 tablet daily for Allergies & Asthma  . pantoprazole (PROTONIX) 40 MG tablet Take 1 tablet (40 mg total) by mouth daily.  [name] Glycol-Propyl Glycol (SYSTANE OP)  Place 1 drop into both eyes 4 (four) times daily as needed (dry eyes).   . potassium chloride SA (KLOR-CON M20) 20 MEQ tablet TAKE 2 TABLETS (40 MEQ TOTAL) BY MOUTH DAILY.  . pravastatin (PRAVACHOL) 40 MG tablet Take 40 mg by mouth daily.  . prednisoLONE acetate (PRED FORTE) 1 % ophthalmic suspension Place 1 drop into both eyes 2 (two) times daily.   . prochlorperazine (COMPAZINE) 5 MG tablet TAKE 1 TABLET BY MOUTH THREE TIMES A DAY AS NEEDED FOR VERTIGO OR NAUSEA (Patient taking differently: Take 5 mg by mouth daily as needed for nausea (vertigo). )  . rivaroxaban (XARELTO) 20 MG TABS tablet Take 1 tablet (20 mg total) by mouth daily with supper.  . torsemide (DEMADEX) 20 MG tablet TAKE 2 TABLETS (40 MG TOTAL) BY MOUTH 2 (TWO) TIMES DAILY.  Marland Kitchen triamcinolone cream (KENALOG) 0.1 % Apply 1 application topically daily.   . metoprolol  succinate (TOPROL-XL) 50 MG 24 hr tablet Take 1 tablet (50 mg total) by mouth daily. Take with or immediately following a meal.   No current facility-administered medications on file prior to visit.     Problem list She has Migraine headache; Essential hypertension; Atherosclerosis of native coronary artery of native heart without angina pectoris; Atrial fibrillation ([place]); Congestive heart failure ([place]); Pulmonary emphysema ([place]); Gastroesophageal reflux disease without esophagitis; Osteopenia; Hypothyroidism; Vitamin D deficiency; Long term current use of anticoagulant therapy; CKD (chronic kidney disease) stage 3, GFR 30-59 ml/min (HCC); Hyperlipidemia, mixed; Encounter for general adult medical examination with abnormal findings; Pulmonary Fibrosis sequellae of Amiodarone; PVD (peripheral vascular disease) with claudication ([place]); Morbid obesity ([place]); AAA (abdominal aortic aneurysm) without rupture ([place]); Abnormal glucose; Gout; Esophageal dysphagia; PAH (pulmonary artery hypertension) ([place]); Chronic systolic CHF (congestive heart failure) ([place]); Depression; Bradycardia; Aortic atherosclerosis ([place]); Acute respiratory failure with hypoxia ([place]); Shortness of breath; Chronic systolic heart failure ([place]); Post-traumatic arthritis of ankle, left; Prediabetes; and COPD (chronic obstructive pulmonary disease) with chronic bronchitis (HCC) on their problem list.    Review of Systems  Constitutional: Negative.  Negative for chills, fatigue and fever.  HENT: Negative.   Respiratory: Positive for shortness of breath. Negative for apnea, cough, choking, chest tightness, wheezing and stridor.   Cardiovascular: Positive for leg swelling. Negative for chest pain and palpitations.  Gastrointestinal: Positive for abdominal distention. Negative for abdominal pain, anal bleeding, blood in stool and constipation.  Genitourinary: Positive for frequency and urgency.  Musculoskeletal: Negative.   Skin: Negative.   Negative for rash.  Neurological: Negative.   Hematological: Negative.   Psychiatric/Behavioral: Negative.        Objective:   Physical Exam Constitutional:      Appearance: She is well-developed.  HENT:     Head: Normocephalic and atraumatic.     Right Ear: Decreased hearing noted.     Left Ear: Decreased hearing noted.     Nose:     Right Sinus: No maxillary sinus tenderness or frontal sinus tenderness.     Left Sinus: No maxillary sinus tenderness or frontal sinus tenderness.  Eyes:     Conjunctiva/sclera: Conjunctivae normal.  Neck:     Musculoskeletal: Normal range of motion and neck supple.  Cardiovascular:     Rate and Rhythm: Normal rate. Rhythm irregularly irregular.     Pulses: Decreased pulses.     Heart sounds: Murmur present. Systolic murmur present.  Pulmonary:     Effort: Pulmonary effort is normal. No accessory muscle usage or respiratory distress.  Breath sounds: Decreased breath sounds present. No wheezing, rhonchi or rales.  Abdominal:     General: Bowel sounds are normal. There is distension.     Palpations: Abdomen is soft. There is no mass.     Tenderness: There is abdominal tenderness (RUQ). There is no guarding or rebound.     Hernia: No hernia is present.  Musculoskeletal:        General: Swelling present.     Lumbar back: She exhibits normal range of motion, no tenderness, no bony tenderness, no swelling, no edema, no deformity, no laceration, no pain, no spasm and normal pulse.     Right lower leg: Edema present.     Left lower leg: Edema present.  Lymphadenopathy:     Cervical: No cervical adenopathy.  Skin:    General: Skin is warm and dry.  Neurological:     Mental Status: She is alert and oriented to person, place, and time.     Cranial Nerves: No cranial nerve deficit.     Sensory: No sensory deficit.        Assessment & Plan:  Crysta was seen today for acute visit and edema.  Diagnoses and all orders for this  visit:  Hypothyroidism, unspecified type -     TSH  CKD  stage III (GFR 51 ml/min) -     COMPLETE METABOLIC PANEL WITH GFR  Chronic combined systolic and diastolic congestive heart failure (HCC) -     Magnesium  Dyspnea, unspecified type Patient is fluid overloaded at this time Check labs Increase demadex to 80mg  in AM and 40 in afternoon Increase potassium Monitor weight/BP Stop sweet tea, water restrict and salt restrict Follow up 1 week Go to the ER if any chest pain, shortness of breath, nausea, dizziness, severe HA, changes vision/speech -     CBC with Differential/Platelet -     COMPLETE METABOLIC PANEL WITH GFR -     Brain natriuretic peptide (72536)  Nocturia -     Urinalysis, Routine w reflex microscopic -     Urine Culture    Future Appointments  Date Time Provider [place]  05/12/2018  8:00 AM [facility] HS VASC 4 - SS [facility]  05/12/2018  9:15 AM Nickel, Sharmon Leyden, NP [facility]  05/15/2018  7:05 AM [facility] DEVICE REMOTES [facility]  05/16/2018  9:30 AM Liane Comber, NP [facility] None  05/23/2018  9:30 AM Mcarthur Rossetti, MD [facility] None  08/23/2018  2:00 PM Liane Comber, NP [facility] None  02/27/2019  9:00 AM [name], MD [facility] None

## 2018-05-05 LAB — CBC WITH DIFFERENTIAL/PLATELET
Absolute Monocytes: 940 cells/uL (ref 200–950)
Basophils Absolute: 52 cells/uL (ref 0–200)
Basophils Relative: 0.6 %
Eosinophils Absolute: 113 cells/uL (ref 15–500)
Eosinophils Relative: 1.3 %
HCT: 43.1 % (ref 35.0–45.0)
HEMOGLOBIN: 14.7 g/dL (ref 11.7–15.5)
Lymphs Abs: 1496 cells/uL (ref 850–3900)
MCH: 30.6 pg (ref 27.0–33.0)
MCHC: 34.1 g/dL (ref 32.0–36.0)
MCV: 89.8 fL (ref 80.0–100.0)
MPV: 10.9 fL (ref 7.5–12.5)
Monocytes Relative: 10.8 %
Neutro Abs: 6099 cells/uL (ref 1500–7800)
Neutrophils Relative %: 70.1 %
Platelets: 244 10*3/uL (ref 140–400)
RBC: 4.8 10*6/uL (ref 3.80–5.10)
RDW: 14.3 % (ref 11.0–15.0)
Total Lymphocyte: 17.2 %
WBC: 8.7 10*3/uL (ref 3.8–10.8)

## 2018-05-05 LAB — COMPLETE METABOLIC PANEL WITHOUT GFR
AG Ratio: 1.9 (calc) (ref 1.0–2.5)
ALT: 14 U/L (ref 6–29)
AST: 15 U/L (ref 10–35)
Albumin: 3.8 g/dL (ref 3.6–5.1)
Alkaline phosphatase (APISO): 38 U/L (ref 37–153)
BUN/Creatinine Ratio: 14 (calc) (ref 6–22)
BUN: 21 mg/dL (ref 7–25)
CO2: 26 mmol/L (ref 20–32)
Calcium: 9.6 mg/dL (ref 8.6–10.4)
Chloride: 105 mmol/L (ref 98–110)
Creat: 1.46 mg/dL — ABNORMAL HIGH (ref 0.60–0.93)
GFR, Est African American: 40 mL/min/1.73m2 — ABNORMAL LOW
GFR, Est Non African American: 35 mL/min/1.73m2 — ABNORMAL LOW
Globulin: 2 g/dL (ref 1.9–3.7)
Glucose, Bld: 104 mg/dL — ABNORMAL HIGH (ref 65–99)
Potassium: 4.8 mmol/L (ref 3.5–5.3)
Sodium: 143 mmol/L (ref 135–146)
Total Bilirubin: 0.7 mg/dL (ref 0.2–1.2)
Total Protein: 5.8 g/dL — ABNORMAL LOW (ref 6.1–8.1)

## 2018-05-05 LAB — BRAIN NATRIURETIC PEPTIDE: Brain Natriuretic Peptide: 158 pg/mL — ABNORMAL HIGH

## 2018-05-05 LAB — TSH: TSH: 2.05 m[IU]/L (ref 0.40–4.50)

## 2018-05-05 LAB — MAGNESIUM: Magnesium: 2.3 mg/dL (ref 1.5–2.5)

## 2018-05-06 LAB — URINE CULTURE
MICRO NUMBER:: 222064
SPECIMEN QUALITY:: ADEQUATE

## 2018-05-06 LAB — URINALYSIS, ROUTINE W REFLEX MICROSCOPIC
Bilirubin Urine: NEGATIVE
Glucose, UA: NEGATIVE
Hgb urine dipstick: NEGATIVE
Ketones, ur: NEGATIVE
Nitrite: NEGATIVE
Protein, ur: NEGATIVE
Specific Gravity, Urine: 1.01 (ref 1.001–1.03)
pH: 6.5 (ref 5.0–8.0)

## 2018-05-09 ENCOUNTER — Other Ambulatory Visit: Payer: Self-pay

## 2018-05-09 DIAGNOSIS — I714 Abdominal aortic aneurysm, without rupture, unspecified: Secondary | ICD-10-CM

## 2018-05-09 DIAGNOSIS — Z95828 Presence of other vascular implants and grafts: Secondary | ICD-10-CM

## 2018-05-10 NOTE — Progress Notes (Signed)
Subjective:    Patient ID: Katie [name], female    DOB: 1940/12/19, 78 y.o.   MRN: [id number]  HPI 78 y.o. WF with history of systolic heart failure, AAA s/p repair, afib, CAD, COPD presents for following up with swelling.   She was seen 1 week ago for fluid overload, had 7 lb weight gain, swelling, SOB. Her demadex was increase to 80mg  in AM and 40 in afternoon    She states her breathing is better, at home she has lost weight however she still has swelling. Her BNP was 158 last visit which is up from a year ago but not as high as it has been. Her protein was down this last visit, normal urine.    Her current complaint is bilateral hands and feet pain, has gotten worse past 4 months, will have sharp pain/pins and needles. Tylenol is not helping. She is on gabapentin 600 at night and then on 300 mg q 6 hours.   BMI is Body mass index is 35.77 kg/m., she is working on diet and exercise. Wt Readings from Last 3 Encounters:  05/11/18 208 lb 6.4 oz (94.5 kg)  05/04/18 208 lb (94.3 kg)  04/03/18 202 lb (91.6 kg)     Lab Results  Component Value Date   GFRNONAA 35 (L) 05/04/2018    Lab Results  Component Value Date   WBC 8.7 05/04/2018   HGB 14.7 05/04/2018   HCT 43.1 05/04/2018   MCV 89.8 05/04/2018   PLT 244 05/04/2018   Lab Results  Component Value Date   ALT 14 05/04/2018   AST 15 05/04/2018   ALKPHOS 36 (L) 10/31/2017   BILITOT 0.7 05/04/2018   Lab Results  Component Value Date   HGBA1C 5.5 01/05/2018    Blood pressure 122/84, pulse 76, temperature 98 F (36.7 C), height 5\' 4"  (1.626 m), weight 208 lb 6.4 oz (94.5 kg), SpO2 95 %.  Medications Current Outpatient Medications on File Prior to Visit  Medication Sig  . acetaminophen (TYLENOL) 500 MG tablet Take 1,000 mg by mouth every 6 (six) hours as needed for moderate pain.   Marland Kitchen albuterol (PROAIR HFA) 108 (90 Base) MCG/ACT inhaler 2 inhalations 10-15 minutes apart evert 4 hours as needed for Asthma Flare (Patient  taking differently: Inhale 2 puffs into the lungs every 4 (four) hours as needed for shortness of breath (asthma flare). )  . allopurinol (ZYLOPRIM) 300 MG tablet TAKE 1 TABLET BY MOUTH EVERY DAY  . aspirin EC 81 MG tablet Take 81 mg by mouth daily.  . cetirizine (ZYRTEC) 10 MG tablet Take 10 mg by mouth as needed.   . Cholecalciferol (VITAMIN D3) 5000 units CAPS Take 5,000 Units by mouth daily.   . digoxin (LANOXIN) 0.125 MG tablet Take 1 tablet (0.125 mg total) by mouth daily.  . Eyelid Cleansers (AVENOVA) 0.01 % SOLN APPLY A SMALL AMOUNT TO SKIN TWICE A DAY AS DIRECTED  . gabapentin (NEURONTIN) 600 MG tablet Take 300 mg by mouth 3 (three) times daily.  Marland Kitchen guaiFENesin (MUCINEX PO) Take 1 tablet by mouth daily as needed (runny nose).  Marland Kitchen ipratropium (ATROVENT) 0.06 % nasal spray Use 1 to 2 sprays each Nostril 2 to 3 x / day as needed  . isosorbide mononitrate (IMDUR) 30 MG 24 hr tablet TAKE 1 TABLET DAILY FOR BP & HEART  . levothyroxine (SYNTHROID) 100 MCG tablet Take 1 tablet daily on an empty stomach with only water for 30 minutes.  . montelukast (  SINGULAIR) 10 MG tablet Take 1 tablet daily for Allergies & Asthma  . pantoprazole (PROTONIX) 40 MG tablet Take 1 tablet (40 mg total) by mouth daily.  [name] Glycol-Propyl Glycol (SYSTANE OP) Place 1 drop into both eyes 4 (four) times daily as needed (dry eyes).   . potassium chloride SA (KLOR-CON M20) 20 MEQ tablet TAKE 2 TABLETS (40 MEQ TOTAL) BY MOUTH DAILY.  . pravastatin (PRAVACHOL) 40 MG tablet Take 40 mg by mouth daily.  . prednisoLONE acetate (PRED FORTE) 1 % ophthalmic suspension Place 1 drop into both eyes 2 (two) times daily.   . prochlorperazine (COMPAZINE) 5 MG tablet TAKE 1 TABLET BY MOUTH THREE TIMES A DAY AS NEEDED FOR VERTIGO OR NAUSEA (Patient taking differently: Take 5 mg by mouth daily as needed for nausea (vertigo). )  . rivaroxaban (XARELTO) 20 MG TABS tablet Take 1 tablet (20 mg total) by mouth daily with supper.  .  torsemide (DEMADEX) 20 MG tablet TAKE 2 TABLETS (40 MG TOTAL) BY MOUTH 2 (TWO) TIMES DAILY.  Marland Kitchen triamcinolone cream (KENALOG) 0.1 % Apply 1 application topically daily.   . metoprolol succinate (TOPROL-XL) 50 MG 24 hr tablet Take 1 tablet (50 mg total) by mouth daily. Take with or immediately following a meal.   No current facility-administered medications on file prior to visit.     Problem list She has Migraine headache; Essential hypertension; Atherosclerosis of native coronary artery of native heart without angina pectoris; Atrial fibrillation ([place]); Congestive heart failure ([place]); Pulmonary emphysema ([place]); Gastroesophageal reflux disease without esophagitis; Osteopenia; Hypothyroidism; Vitamin D deficiency; Long term current use of anticoagulant therapy; CKD (chronic kidney disease) stage 3, GFR 30-59 ml/min (HCC); Hyperlipidemia, mixed; Encounter for general adult medical examination with abnormal findings; Pulmonary Fibrosis sequellae of Amiodarone; PVD (peripheral vascular disease) with claudication ([place]); Morbid obesity ([place]); AAA (abdominal aortic aneurysm) without rupture ([place]); Abnormal glucose; Gout; Esophageal dysphagia; PAH (pulmonary artery hypertension) ([place]); Chronic systolic CHF (congestive heart failure) ([place]); Depression; Bradycardia; Aortic atherosclerosis ([place]); Acute respiratory failure with hypoxia ([place]); Shortness of breath; Chronic systolic heart failure ([place]); Post-traumatic arthritis of ankle, left; Prediabetes; and COPD (chronic obstructive pulmonary disease) with chronic bronchitis (HCC) on their problem list.    Review of Systems  Constitutional: Negative.  Negative for chills, fatigue and fever.  HENT: Negative.   Respiratory: Positive for shortness of breath. Negative for apnea, cough, choking, chest tightness, wheezing and stridor.   Cardiovascular: Positive for leg swelling. Negative for chest pain and palpitations.  Gastrointestinal: Positive for abdominal  distention. Negative for abdominal pain, anal bleeding, blood in stool and constipation.  Genitourinary: Negative.  Negative for frequency and urgency.  Musculoskeletal: Negative.   Skin: Negative.  Negative for rash.  Neurological: Negative.   Hematological: Negative.   Psychiatric/Behavioral: Negative.        Objective:   Physical Exam Constitutional:      Appearance: She is well-developed.  HENT:     Head: Normocephalic and atraumatic.     Right Ear: Decreased hearing noted.     Left Ear: Decreased hearing noted.     Nose:     Right Sinus: No maxillary sinus tenderness or frontal sinus tenderness.     Left Sinus: No maxillary sinus tenderness or frontal sinus tenderness.  Eyes:     Conjunctiva/sclera: Conjunctivae normal.  Neck:     Musculoskeletal: Normal range of motion and neck supple.  Cardiovascular:     Rate and Rhythm: Normal rate. Rhythm irregularly irregular.  Pulses: Decreased pulses.     Heart sounds: Murmur present. Systolic murmur present.  Pulmonary:     Effort: Pulmonary effort is normal. No accessory muscle usage or respiratory distress.     Breath sounds: Decreased breath sounds present. No wheezing, rhonchi or rales.  Abdominal:     General: Bowel sounds are normal. There is distension.     Palpations: Abdomen is soft. There is no mass.     Tenderness: There is abdominal tenderness (RUQ). There is no guarding or rebound.     Hernia: No hernia is present.  Musculoskeletal:        General: Swelling present.     Lumbar back: She exhibits normal range of motion, no tenderness, no bony tenderness, no swelling, no edema, no deformity, no laceration, no pain, no spasm and normal pulse.     Right lower leg: Edema present.     Left lower leg: Edema present.     Comments: Bilateral feet with decreased pulses, dependent rubor, decreased hair, 2+ swelling.   Lymphadenopathy:     Cervical: No cervical adenopathy.  Skin:    General: Skin is warm and dry.   Neurological:     Mental Status: She is alert and oriented to person, place, and time.     Cranial Nerves: No cranial nerve deficit.     Sensory: No sensory deficit.        Assessment & Plan:   Chronic combined systolic and diastolic congestive heart failure (HCC) -     CBC with Differential/Platelet -     COMPLETE METABOLIC PANEL WITH GFR -     metolazone (ZAROXOLYN) 2.5 MG tablet; 1 pill 30 mins prior to fluid pill as needed/directed Increase potassium Monitor weight/BP Stop sweet tea, water restrict and salt restrict Follow up 1 week Go to the ER if any chest pain, shortness of breath, nausea, dizziness, severe HA, changes vision/speech Continue your demadex to 40 mg in the morning and 40 at night Do the 2.5 of the new fluid pill 30 mins before the demadex for 3 days, can do  AS needed after that If you feel that you are not peeing enough or losing weight with the 2.5 mg of the metloazone then take 2 pill for a total of 5 mg  Abnormal serum total protein level -     Protein Electrophoresis,Random Urn -     Serum protein electrophoresis with reflex -     IgG, IgA, IgM -     Kappa/lambda light chains  Neuropathy Check labs Increase the gabapentin to 600 mg in the morning and continue 300mg  q 6 hours but 600mg  at night.  -     Protein Electrophoresis,Random Urn -     Serum protein electrophoresis with reflex -     IgG, IgA, IgM -     Kappa/lambda light chains -     Vitamin B12  Medication management -     Magnesium    Future Appointments  Date Time Provider [place]  05/11/2018  3:45 PM Vicie Mutters, PA-C [facility] None  05/12/2018  8:00 AM [facility] HS VASC 4 - SS [facility]  05/12/2018  9:15 AM Nickel, Sharmon Leyden, NP [facility]  05/15/2018  7:05 AM [facility] DEVICE REMOTES [facility]  05/16/2018  9:30 AM Liane Comber, NP [facility] None  05/23/2018  9:30 AM Mcarthur Rossetti, MD [facility] None  08/23/2018  2:00 PM Liane Comber, NP  [facility] None  02/27/2019  9:00 AM [name],  MD [facility] None

## 2018-05-11 ENCOUNTER — Encounter: Payer: Self-pay | Admitting: Physician Assistant

## 2018-05-11 ENCOUNTER — Ambulatory Visit (INDEPENDENT_AMBULATORY_CARE_PROVIDER_SITE_OTHER): Payer: Medicare Other | Admitting: Physician Assistant

## 2018-05-11 VITALS — BP 122/84 | HR 76 | Temp 98.0°F | Ht 64.0 in | Wt 208.4 lb

## 2018-05-11 DIAGNOSIS — G629 Polyneuropathy, unspecified: Secondary | ICD-10-CM | POA: Diagnosis not present

## 2018-05-11 DIAGNOSIS — Z79899 Other long term (current) drug therapy: Secondary | ICD-10-CM | POA: Diagnosis not present

## 2018-05-11 DIAGNOSIS — I5042 Chronic combined systolic (congestive) and diastolic (congestive) heart failure: Secondary | ICD-10-CM

## 2018-05-11 DIAGNOSIS — R799 Abnormal finding of blood chemistry, unspecified: Secondary | ICD-10-CM | POA: Diagnosis not present

## 2018-05-11 MED ORDER — METOLAZONE 2.5 MG PO TABS: ORAL_TABLET | ORAL | 0 refills | Status: DC

## 2018-05-11 NOTE — Patient Instructions (Addendum)
Get on a B12 We are checking some labs on you  Increase the gabapentin to 600 mg in the morning and continue 300mg  q 6 hours but 600mg  at night.   Continue your demadex to 40 mg in the morning and 40 at night Do the 2.5 of the new fluid pill 30 mins before the demadex for 3 days, can do  AS needed after that  If you feel that you are not peeing enough or losing weight with the 2.5 mg of the metloazone then take 2 pill for a total of 5 mg  We will check some labs on you  Go to the ER if any chest pain, shortness of breath, nausea, dizziness, severe HA, changes vision/speech Follow up with cardiology  Do the following things EVERYDAY: 1) Weigh yourself in the morning before breakfast or at the same time every day. Write it down and keep it in a log. 2) Take your medicines as prescribed 3) Eat low salt foods-Limit salt (sodium) to 2000 mg per day. Best thing to do is avoid processed foods.   4) Stay as active as you can everyday 5) Limit all fluids for the day to less than 1.5 liters  Call your doctor if:  Anytime you have any of the following symptoms:  1) 2 pound weight gain in 24 hours or 5 pounds in 1 week  2) shortness of breath, with or without a dry hacking cough  3) swelling in the hands, LEGs, feet or stomach  4) if you have to sleep on extra pillows at night in order to breathe. 5) after laying down at night for 20-30 mins, you wake up short of breath.   These can all be signs of fluid overload.    Heart Failure Heart failure means your heart has trouble pumping blood. This makes it hard for your body to work well. Heart failure is usually a long-term (chronic) condition. You must take good care of yourself and follow your doctor's treatment plan. Follow these instructions at home:  Take your heart medicine as told by your doctor. ? Do not stop taking medicine unless your doctor tells you to. ? Do not skip any dose of medicine. ? Refill your medicines before they run  out. ? Take other medicines only as told by your doctor or pharmacist.  Stay active if told by your doctor. The elderly and people with severe heart failure should talk with a doctor about physical activity.  Eat heart-healthy foods. Choose foods that are without trans fat and are low in saturated fat, cholesterol, and salt (sodium). This includes fresh or frozen fruits and vegetables, fish, lean meats, fat-free or low-fat dairy foods, whole grains, and high-fiber foods. Lentils and dried peas and beans (legumes) are also good choices.  Limit salt if told by your doctor.  Cook in a healthy way. Roast, grill, broil, bake, poach, steam, or stir-fry foods.  Limit fluids as told by your doctor.  Weigh yourself every morning. Do this after you pee (urinate) and before you eat breakfast. Write down your weight to give to your doctor.  Take your blood pressure and write it down if your doctor tells you to.  Ask your doctor how to check your pulse. Check your pulse as told.  Lose weight if told by your doctor.  Stop smoking or chewing tobacco. Do not use gum or patches that help you quit without your doctor's approval.  Schedule and go to doctor visits as told.  Nonpregnant women should have no more than 1 drink a day. Men should have no more than 2 drinks a day. Talk to your doctor about drinking alcohol.  Stop illegal drug use.  Stay current with shots (immunizations).  Manage your health conditions as told by your doctor.  Learn to manage your stress.  Rest when you are tired.  If it is really hot outside: ? Avoid intense activities. ? Use air conditioning or fans, or get in a cooler place. ? Avoid caffeine and alcohol. ? Wear loose-fitting, lightweight, and light-colored clothing.  If it is really cold outside: ? Avoid intense activities. ? Layer your clothing. ? Wear mittens or gloves, a hat, and a scarf when going outside. ? Avoid alcohol.  Learn about heart failure and  get support as needed.  Get help to maintain or improve your quality of life and your ability to care for yourself as needed. Contact a doctor if:  You gain weight quickly.  You are more short of breath than usual.  You cannot do your normal activities.  You tire easily.  You cough more than normal, especially with activity.  You have any or more puffiness (swelling) in areas such as your hands, feet, ankles, or belly (abdomen).  You cannot sleep because it is hard to breathe.  You feel like your heart is beating fast (palpitations).  You get dizzy or light-headed when you stand up. Get help right away if:  You have trouble breathing.  There is a change in mental status, such as becoming less alert or not being able to focus.  You have chest pain or discomfort.  You faint. This information is not intended to replace advice given to you by your health care provider. Make sure you discuss any questions you have with your health care provider. Document Released: 12/09/2007 Document Revised: 08/07/2015 Document Reviewed: 04/17/2012 Elsevier Interactive Patient Education  2017 [employer].

## 2018-05-12 ENCOUNTER — Encounter: Payer: Self-pay | Admitting: Family

## 2018-05-12 ENCOUNTER — Ambulatory Visit (HOSPITAL_COMMUNITY)
Admission: RE | Admit: 2018-05-12 | Discharge: 2018-05-12 | Disposition: A | Discharge status: Home / Self Care | Payer: Medicare Other | Source: Ambulatory Visit | Attending: Vascular Surgery | Admitting: Vascular Surgery

## 2018-05-12 ENCOUNTER — Other Ambulatory Visit: Payer: Self-pay

## 2018-05-12 ENCOUNTER — Ambulatory Visit (INDEPENDENT_AMBULATORY_CARE_PROVIDER_SITE_OTHER): Payer: Medicare Other | Admitting: Physician Assistant

## 2018-05-12 VITALS — BP 122/69 | HR 101 | Temp 96.8°F | Resp 16 | Ht 64.0 in | Wt 205.6 lb

## 2018-05-12 DIAGNOSIS — I714 Abdominal aortic aneurysm, without rupture, unspecified: Secondary | ICD-10-CM

## 2018-05-12 DIAGNOSIS — Z95828 Presence of other vascular implants and grafts: Secondary | ICD-10-CM | POA: Insufficient documentation

## 2018-05-12 NOTE — Progress Notes (Signed)
History of Present Illness:  Patient is a 78 y.o. year old female who presents for evaluation of abdominal aortic aneurysm EVAR 04/08/2013 by Dr. Kellie Simmering.   He denies has no new symptoms of abdominal or back discomfort as well as no symptoms of claudication.  She denies medical changes since her last visit.    Past Medical History:  Diagnosis Date  . AAA (abdominal aortic aneurysm) ([place])   . AICD (automatic cardioverter/defibrillator) present 11/10/2017  . Arthritis    "some in my knees" (11/10/2017)  . Atrial fibrillation ([place])   . CHF (congestive heart failure) ([place])   . Chronic bronchitis ([place])   . COPD (chronic obstructive pulmonary disease) ([place])    'they say I don't; have a problem breathing though" (11/10/2017)  . Depression   . GERD (gastroesophageal reflux disease)   . Gout    "daily RX" (11/10/2017)  . History of hiatal hernia   . Hyperlipidemia   . Hypertension   . Hypothyroid   . Migraine headache    "hx; none since 1980s" (11/10/2017)  . Osteopenia   . Pneumonia    "~ 3 times" (11/10/2017)  . PVD (peripheral vascular disease) ([place])     Past Surgical History:  Procedure Laterality Date  . ABDOMINAL AORTIC ENDOVASCULAR STENT GRAFT N/A 04/11/2013   Procedure: ABDOMINAL AORTIC ENDOVASCULAR STENT GRAFT WITH RIGHT FEMORAL PATCH ANGIOPLASTY;  Surgeon: Mal Misty, MD;  Location: [facility];  Service: Vascular;  Laterality: N/A;  . BIV ICD INSERTION CRT-D  11/10/2017  . BIV ICD INSERTION CRT-D N/A 11/10/2017   Procedure: BIV ICD INSERTION CRT-D;  Surgeon: Evans Lance, MD;  Location: [facility];  Service: Cardiovascular;  Laterality: N/A;  . BREAST SURGERY     LEFT BREAST BIOPSY  . CARDIAC CATHETERIZATION    . CATARACT EXTRACTION W/ INTRAOCULAR LENS  IMPLANT, BILATERAL Bilateral   . CHOLECYSTECTOMY OPEN  1972  . COLONOSCOPY    . EYE SURGERY Bilateral    "bleeding in my eyes"  . FRACTURE SURGERY    . TIBIA FRACTURE SURGERY Left 1970s  . TONSILLECTOMY    .  VARICOSE VEIN SURGERY Bilateral    "laser"     Social History Social History   Tobacco Use  . Smoking status: Former Smoker    Packs/day: 2.00    Years: 54.00    Pack years: 108.00    Types: Cigarettes    Last attempt to quit: 08/10/2002    Years since quitting: 15.7  . Smokeless tobacco: Never Used  Substance Use Topics  . Alcohol use: Yes    Comment: 11/10/2017 "glass of wine q 3-4 months"  . Drug use: Never    Family History Family History  Problem Relation Age of Onset  . Cirrhosis Mother   . Cancer Mother 69       PANCREAS  . Heart defect Sister   . Breast cancer Sister        age 42  . Heart disease Sister   . Stroke Sister   . Alcohol abuse Father   . Depression Father   . Hypertension Brother   . Hyperlipidemia Son   . Heart disease Daughter     Allergies  Allergies  Allergen Reactions  . Amiodarone Other (See Comments)    PULMONARY TOXICITY  . Diovan [Valsartan] Other (See Comments)    HYPOTENSION  . Doxycycline Diarrhea and Other (See Comments)    VISUAL DISTURBANCE  . Flexeril [Cyclobenzaprine] Other (See  Comments)    FATIGUE  . Keflex [Cephalexin] Diarrhea  . Verapamil Other (See Comments)    EDEMA  . Codeine Hives     Current Outpatient Medications  Medication Sig Dispense Refill  . acetaminophen (TYLENOL) 500 MG tablet Take 1,000 mg by mouth every 6 (six) hours as needed for moderate pain.     Marland Kitchen albuterol (PROAIR HFA) 108 (90 Base) MCG/ACT inhaler 2 inhalations 10-15 minutes apart evert 4 hours as needed for Asthma Flare (Patient taking differently: Inhale 2 puffs into the lungs every 4 (four) hours as needed for shortness of breath (asthma flare). ) 48 g 3  . allopurinol (ZYLOPRIM) 300 MG tablet TAKE 1 TABLET BY MOUTH EVERY DAY 90 tablet 1  . aspirin EC 81 MG tablet Take 81 mg by mouth daily.    . cetirizine (ZYRTEC) 10 MG tablet Take 10 mg by mouth as needed.     . Cholecalciferol (VITAMIN D3) 5000 units CAPS Take 5,000 Units by mouth  daily.     . digoxin (LANOXIN) 0.125 MG tablet Take 1 tablet (0.125 mg total) by mouth daily. 90 tablet 1  . Eyelid Cleansers (AVENOVA) 0.01 % SOLN APPLY A SMALL AMOUNT TO SKIN TWICE A DAY AS DIRECTED  99  . gabapentin (NEURONTIN) 600 MG tablet Take 300 mg by mouth 3 (three) times daily.    Marland Kitchen guaiFENesin (MUCINEX PO) Take 1 tablet by mouth daily as needed (runny nose).    Marland Kitchen ipratropium (ATROVENT) 0.06 % nasal spray Use 1 to 2 sprays each Nostril 2 to 3 x / day as needed 45 mL 3  . isosorbide mononitrate (IMDUR) 30 MG 24 hr tablet TAKE 1 TABLET DAILY FOR BP & HEART 90 tablet 1  . levothyroxine (SYNTHROID) 100 MCG tablet Take 1 tablet daily on an empty stomach with only water for 30 minutes. 90 tablet 1  . metolazone (ZAROXOLYN) 2.5 MG tablet 1 pill 30 mins prior to fluid pill as needed/directed 30 tablet 0  . montelukast (SINGULAIR) 10 MG tablet Take 1 tablet daily for Allergies & Asthma 90 tablet 1  . pantoprazole (PROTONIX) 40 MG tablet Take 1 tablet (40 mg total) by mouth daily.    [name] Glycol-Propyl Glycol (SYSTANE OP) Place 1 drop into both eyes 4 (four) times daily as needed (dry eyes).     . potassium chloride SA (KLOR-CON M20) 20 MEQ tablet TAKE 2 TABLETS (40 MEQ TOTAL) BY MOUTH DAILY. 180 tablet 1  . pravastatin (PRAVACHOL) 40 MG tablet Take 40 mg by mouth daily.    . prednisoLONE acetate (PRED FORTE) 1 % ophthalmic suspension Place 1 drop into both eyes 2 (two) times daily.   1  . prochlorperazine (COMPAZINE) 5 MG tablet TAKE 1 TABLET BY MOUTH THREE TIMES A DAY AS NEEDED FOR VERTIGO OR NAUSEA (Patient taking differently: Take 5 mg by mouth daily as needed for nausea (vertigo). ) 60 tablet 1  . rivaroxaban (XARELTO) 20 MG TABS tablet Take 1 tablet (20 mg total) by mouth daily with supper. 30 tablet 5  . torsemide (DEMADEX) 20 MG tablet TAKE 2 TABLETS (40 MG TOTAL) BY MOUTH 2 (TWO) TIMES DAILY. 180 tablet 3  . triamcinolone cream (KENALOG) 0.1 % Apply 1 application topically daily.      . metoprolol succinate (TOPROL-XL) 50 MG 24 hr tablet Take 1 tablet (50 mg total) by mouth daily. Take with or immediately following a meal. 90 tablet 3  . predniSONE (DELTASONE) 10 MG tablet TAKE 2TABS 3  TIMES A DAY X3 DAYS, 2TABS TWICE A DAY X3 DAYS, 2TABS ONCE DAILY X5 DAYS     No current facility-administered medications for this visit.     ROS:   General:  No weight loss, Fever, chills  HEENT: No recent headaches, no nasal bleeding, no visual changes, no sore throat  Neurologic: No dizziness, blackouts, seizures. No recent symptoms of stroke or mini- stroke. No recent episodes of slurred speech, or temporary blindness.  Cardiac: No recent episodes of chest pain/pressure, no shortness of breath at rest.  + shortness of breath with exertion.  Denies history of atrial fibrillation or irregular heartbeat  Vascular: No history of rest pain in feet.  No history of claudication.  No history of non-healing ulcer, No history of DVT   Pulmonary: No home oxygen, no productive cough, no hemoptysis,  No asthma or wheezing  Musculoskeletal:  [ ]  Arthritis, [ x] Low back pain,  [ ]  Joint pain  Hematologic:No history of hypercoagulable state.  No history of easy bleeding.  No history of anemia  Gastrointestinal: No hematochezia or melena,  No gastroesophageal reflux, no trouble swallowing  Urinary: [ ]  chronic Kidney disease, [ ]  on HD - [ ]  MWF or [ ]  TTHS, [ ]  Burning with urination, [ ]  Frequent urination, [ ]  Difficulty urinating;   Skin: No rashes  Psychological: No history of anxiety,  No history of depression   Physical Examination  Vitals:   05/12/18 0852  BP: 122/69  Pulse: (!) 101  Resp: 16  Temp: (!) 96.8 F (36 C)  TempSrc: Oral  SpO2: 95%  Weight: 205 lb 9.3 oz (93.2 kg)  Height: 5\' 4"  (1.626 m)    Body mass index is 35.29 kg/m.  General:  Alert and oriented, no acute distress HEENT: Normal, normocephalic Neck: No bruit or JVD Pulmonary: Clear to auscultation  bilaterally Cardiac: Regular Rate and Rhythm without murmur Gastrointestinal: Soft, non-tender, non-distended, no mass, no scars Skin: No rash Extremity Pulses:  2+ radial, brachial, femoral, dorsalis pedis, posterior tibial pulses bilaterally Musculoskeletal: No deformity + B LE edema edema  Neurologic: Upper and lower extremity motor grossly intact and symmetric  DATA:  Endovascular Aortic Repair (EVAR): +----------+----------------+-------------------+-------------------+           Diameter AP (cm)Diameter Trans (cm)Velocities (cm/sec) +----------+----------------+-------------------+-------------------+ Aorta     3.50            3.70               40                  +----------+----------------+-------------------+-------------------+ Right Limb1.27            1.39               57                  +----------+----------------+-------------------+-------------------+ Left Limb 1.25            1.27               53                  +----------+----------------+-------------------+-------------------+    ASSESSMENT: AAA s/p EVAR without endo leak.    PLAN: She is stable with palpable DP pulses B and asymptomatic.  She will f/u in 1 year for repeat AAA duplex.     Ruta Hinds, MD [facility] Office: [phone] Pager: [phone]

## 2018-05-15 ENCOUNTER — Ambulatory Visit (INDEPENDENT_AMBULATORY_CARE_PROVIDER_SITE_OTHER): Payer: Medicare Other | Admitting: *Deleted

## 2018-05-15 DIAGNOSIS — I428 Other cardiomyopathies: Secondary | ICD-10-CM | POA: Diagnosis not present

## 2018-05-15 LAB — CUP PACEART REMOTE DEVICE CHECK
Date Time Interrogation Session: 20200302153449
Implantable Lead Implant Date: 20190829
Implantable Lead Implant Date: 20190829
Implantable Lead Implant Date: 20190829
Implantable Lead Location: 753858
Implantable Lead Location: 753859
Implantable Lead Location: 753860
Implantable Lead Model: 292
Implantable Lead Model: 4674
Implantable Lead Model: 7740
Implantable Lead Serial Number: 696999
Implantable Lead Serial Number: 827497
MDC IDC LEAD SERIAL: 446776
MDC IDC PG IMPLANT DT: 20190829
Pulse Gen Serial Number: 215092

## 2018-05-15 LAB — PROTEIN ELECTROPHORESIS,RANDOM URN
Creatinine, Urine: 37 mg/dL (ref 20–275)
Protein/Creat Ratio: 108 mg/g creat (ref 21–161)
Protein/Creatinine Ratio: 0.108 mg/mg creat (ref 0.021–0.16)
TOTAL PROTEIN, URINE: 4 mg/dL — AB (ref 5–24)

## 2018-05-15 NOTE — Progress Notes (Signed)
FOLLOW UP 3 Month  Assessment and Plan:   Diagnoses and all orders for this visit:  Permanent atrial fibrillation Taking Xeralto20mg  daily  Discussed if patient falls to immediately contact office or go to ER.   Patient understands to call the office before starting a new medication. Follow up in 3 months  Atherosclerosis of native coronary artery of native heart without angina pectoris Control blood pressure, cholesterol, glucose, increase exercise.   Chronic combined systolic and diastolic congestive heart failure (HCC) Weights trending down with addition of metolazone Decrease torsemide to 40 mg BID as previously instructed  Will follow up once again in week to evaluate progress after stopping metolazone  Disease process and medications discussed. Questions answered fully. Emphasized salt restriction, less than 2000mg  a day. Encouraged daily monitoring of the patient's weight, call office if 5 lb weight loss or gain in a day.  Encouraged regular exercise. If any increasing shortness of breath, swelling, or chest pressure go to ER immediately.  decrease your fluid intake to less than 2 L daily please remember to always increase your potassium intake with any increase of your fluid pill.   CKD  stage III (HCC) Increase fluids, avoid NSAIDS, monitor sugars, will monitor Will check CMP  COPD/Pulmonary fibrosis Improved since defibrillator Has discontinue inhalers, has albuterol PRN Follow up with pulmonology, Dr Melvyn Novas  Hypertension Well controlled with current medications  Follows with Cardiology Monitor blood pressure at home; patient to call if consistently greater than 130/80 Continue DASH diet.   Reminder to go to the ER if any CP, SOB, nausea, dizziness, severe HA, changes vision/speech, left arm numbness and tingling and jaw pain.  Cholesterol Currently at goal; continue current regiment Continue low cholesterol diet and exercise.  Check lipid panel.   Morbid  obesity ([place]) Long discussion about weight loss, diet, and exercise Recommended diet heavy in fruits and veggies and low in animal meats, cheeses, and dairy products, appropriate calorie intake She will work on slowly increasing exercise, cut down on portions/snacks Discussed appropriate weight for height and initial weight goal (185 lb) Follow up at next visit  Vitamin D Def At goal at last visit; continue supplementation to maintain goal of 60-100 Defer Vit D level  Gastroesophageal reflux disease without esophagitis Doing well on current regiment  Post-traumatic arthritis of ankle, left  Wearing brace Ambulating with cane Following with Ortho for this- has scheduled with Dr. Ninfa Linden Follow up in one day for surgical evaluation  Hypothyroidism, unspecified type Taking Levothyroxine 86mcg daily in am on empty stomach continue medications the same pending lab results reminded to take on an empty stomach 30-62mins before food.  check TSH level  Gout Continue allopurinol Diet discussed Check uric acid as needed   Continue diet and meds as discussed. Further disposition pending results of labs. Discussed med's effects and SE's.   Over 30 minutes of exam, counseling, chart review, and critical decision making was performed.   Future Appointments  Date Time Provider Rote  05/23/2018  9:30 AM Mcarthur Rossetti, MD [facility] None  08/23/2018  2:00 PM Liane Comber, NP [facility] None  02/27/2019  9:00 AM [name], MD [facility] None    ----------------------------------------------------------------------------------------------------------------------  HPI 78 y.o. female  presents for 3 month follow up on hypertension, cholesterol, prediabetes, obesity and vitamin D deficiency.    BMI is Body mass index is 35.02 kg/m., she has not been working on diet and exercise related to ankle pain and impaired mobility.  She has a  history of AAA s/p repair, a.  Fib with chadsvasc of 2 on xarelto, difibrillator implantation in 05/7340, Combined Systolic and Diastolic CHF (ECHO 10/7679 EF 35-40%), followed by Dr. Lovena Le denies dyspnea on exertion and paroxysmal nocturnal dyspnea. Positive for lower extremity edema and orthopnea.  She was seen 2 weeks ago for concerns with persistent edema and weight gain (7#). Her demadex was increase to 80mg  in AM and 40 in afternoon, and followed up with improved breathing and weights down but with persistent edema. She also c/o of persistent parasthesias in bilateral hands and feet, treated by gabapentin; protein was down at that time with normal UA.   She states her breathing is better, at home she has lost weight however she still has swelling. Her BNP was 158 last visit which is up from a year ago but not as high as it has been. Her protein was down this last visit, normal urine. She is pending electrophoresis; she had mildly elevated kappa light chains at 25.3; IgG, IgA, IgM were WNL.   Metalozone 2.5 mg was added to be taken prior to dermadex 40 mg BID x 3 days then PRN She has been taking demadex to 40 mg in the morning and 40 at night, took 2 doses of metolazone, will take third today. Edema is much improved in lower extremities. She does monitor weights closely at home.   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 Melvyn Novas.   Her blood pressure has been controlled at home, today their BP is BP: 120/70  She does not workout. She denies chest pain, shortness of breath, dizziness.   She is on cholesterol medication Pravastatin 40 mg every other day and denies myalgias. Her cholesterol is not at goal. The cholesterol last visit was:   Lab Results  Component Value Date   CHOL 153 01/05/2018   HDL  33 (L) 01/05/2018   LDLCALC 94 01/05/2018   TRIG 166 (H) 01/05/2018   CHOLHDL 4.6 01/05/2018    She has not been working on diet and exercise for glucose management, and denies hyperglycemia. Last A1C in the office was:  Lab Results  Component Value Date   HGBA1C 5.5 01/05/2018   Patient is on Vitamin D supplement.   Lab Results  Component Value Date   VD25OH 66 01/05/2018     She is on thyroid medication. Her medication was not changed last visit.   Lab Results  Component Value Date   TSH 2.05 05/04/2018     Lab Results  Component Value Date   GFRNONAA 30 (L) 05/11/2018      Current Medications:  Current Outpatient Medications on File Prior to Visit  Medication Sig  . acetaminophen (TYLENOL) 500 MG tablet Take 1,000 mg by mouth every 6 (six) hours as needed for moderate pain.   Marland Kitchen albuterol (PROAIR HFA) 108 (90 Base) MCG/ACT inhaler 2 inhalations 10-15 minutes apart evert 4 hours as needed for Asthma Flare (Patient taking differently: Inhale 2 puffs into the lungs every 4 (four) hours as needed for shortness of breath (asthma flare). )  . allopurinol (ZYLOPRIM) 300 MG tablet TAKE 1 TABLET BY MOUTH EVERY DAY  . aspirin EC 81 MG tablet Take 81 mg by mouth daily.  . cetirizine (ZYRTEC) 10 MG tablet  Take 10 mg by mouth as needed.   . Cholecalciferol (VITAMIN D3) 5000 units CAPS Take 5,000 Units by mouth daily.   . digoxin (LANOXIN) 0.125 MG tablet Take 1 tablet (0.125 mg total) by mouth daily.  . Eyelid Cleansers (AVENOVA) 0.01 % SOLN APPLY A SMALL AMOUNT TO SKIN TWICE A DAY AS DIRECTED  . gabapentin (NEURONTIN) 600 MG tablet Take 300 mg by mouth 3 (three) times daily.  Marland Kitchen guaiFENesin (MUCINEX PO) Take 1 tablet by mouth daily as needed (runny nose).  Marland Kitchen ipratropium (ATROVENT) 0.06 % nasal spray Use 1 to 2 sprays each Nostril 2 to 3 x / day as needed  . isosorbide mononitrate (IMDUR) 30 MG 24 hr tablet TAKE 1 TABLET DAILY FOR BP & HEART  . levothyroxine (SYNTHROID) 100 MCG tablet  Take 1 tablet daily on an empty stomach with only water for 30 minutes.  . metolazone (ZAROXOLYN) 2.5 MG tablet 1 pill 30 mins prior to fluid pill as needed/directed  . metoprolol succinate (TOPROL-XL) 50 MG 24 hr tablet Take 1 tablet (50 mg total) by mouth daily. Take with or immediately following a meal.  . montelukast (SINGULAIR) 10 MG tablet Take 1 tablet daily for Allergies & Asthma  . pantoprazole (PROTONIX) 40 MG tablet Take 1 tablet (40 mg total) by mouth daily.  [name] Glycol-Propyl Glycol (SYSTANE OP) Place 1 drop into both eyes 4 (four) times daily as needed (dry eyes).   . potassium chloride SA (KLOR-CON M20) 20 MEQ tablet TAKE 2 TABLETS (40 MEQ TOTAL) BY MOUTH DAILY.  . pravastatin (PRAVACHOL) 40 MG tablet Take 40 mg by mouth daily.  . prednisoLONE acetate (PRED FORTE) 1 % ophthalmic suspension Place 1 drop into both eyes 2 (two) times daily.   . predniSONE (DELTASONE) 10 MG tablet TAKE 2TABS 3 TIMES A DAY X3 DAYS, 2TABS TWICE A DAY X3 DAYS, 2TABS ONCE DAILY X5 DAYS  . prochlorperazine (COMPAZINE) 5 MG tablet TAKE 1 TABLET BY MOUTH THREE TIMES A DAY AS NEEDED FOR VERTIGO OR NAUSEA (Patient taking differently: Take 5 mg by mouth daily as needed for nausea (vertigo). )  . rivaroxaban (XARELTO) 20 MG TABS tablet Take 1 tablet (20 mg total) by mouth daily with supper.  . torsemide (DEMADEX) 20 MG tablet TAKE 2 TABLETS (40 MG TOTAL) BY MOUTH 2 (TWO) TIMES DAILY.  Marland Kitchen triamcinolone cream (KENALOG) 0.1 % Apply 1 application topically daily.    No current facility-administered medications on file prior to visit.      Allergies:  Allergies  Allergen Reactions  . Amiodarone Other (See Comments)    PULMONARY TOXICITY  . Diovan [Valsartan] Other (See Comments)    HYPOTENSION  . Doxycycline Diarrhea and Other (See Comments)    VISUAL DISTURBANCE  . Flexeril [Cyclobenzaprine] Other (See Comments)    FATIGUE  . Keflex [Cephalexin] Diarrhea  . Verapamil Other (See Comments)    EDEMA   . Codeine Hives     Medical History:  Past Medical History:  Diagnosis Date  . AAA (abdominal aortic aneurysm) ([place])   . AICD (automatic cardioverter/defibrillator) present 11/10/2017  . Arthritis    "some in my knees" (11/10/2017)  . Atrial fibrillation ([place])   . CHF (congestive heart failure) ([place])   . Chronic bronchitis ([place])   . COPD (chronic obstructive pulmonary disease) ([place])    'they say I don't; have a problem breathing though" (11/10/2017)  . Depression   . GERD (gastroesophageal reflux disease)   . Gout    "  daily RX" (11/10/2017)  . History of hiatal hernia   . Hyperlipidemia   . Hypertension   . Hypothyroid   . Migraine headache    "hx; none since 1980s" (11/10/2017)  . Osteopenia   . Pneumonia    "~ 3 times" (11/10/2017)  . PVD (peripheral vascular disease) ([place])    Family history- Reviewed and unchanged Social history- Reviewed and unchanged   Review of Systems:  Review of Systems  Constitutional: Negative for chills, diaphoresis, fever, malaise/fatigue and weight loss.  HENT: Negative for congestion, ear discharge, ear pain, hearing loss, nosebleeds, sinus pain, sore throat and tinnitus.   Eyes: Negative for blurred vision, double vision, pain, discharge and redness.  Respiratory: Negative for cough, hemoptysis, sputum production, shortness of breath and wheezing.   Cardiovascular: Positive for leg swelling (significantly improved). Negative for chest pain, palpitations, orthopnea, claudication and PND.       Mild but resolves over night.  Gastrointestinal: Negative for abdominal pain, blood in stool, constipation, diarrhea, heartburn, melena, nausea and vomiting.  Genitourinary: Negative for dysuria, flank pain, frequency, hematuria and urgency.  Musculoskeletal: Positive for back pain and joint pain. Negative for falls, myalgias and neck pain.       Left ankle pain  Skin: Negative for itching and rash.       Top of left foot  Neurological: Negative for  dizziness, tingling, tremors, seizures, loss of consciousness, weakness and headaches.  Endo/Heme/Allergies: Positive for environmental allergies. Negative for polydipsia. Does not bruise/bleed easily.  Psychiatric/Behavioral: Negative for depression, memory loss and suicidal ideas. The patient is not nervous/anxious and does not have insomnia.       Physical Exam: BP 120/70   Pulse 74   Temp (!) 97.4 F (36.3 C)   Ht 5\' 4"  (1.626 m)   Wt 204 lb (92.5 kg)   SpO2 96%   BMI 35.02 kg/m  Wt Readings from Last 3 Encounters:  05/16/18 204 lb (92.5 kg)  05/12/18 205 lb 9.3 oz (93.2 kg)  05/11/18 208 lb 6.4 oz (94.5 kg)   General Appearance: Well nourished, in no apparent distress. Eyes: PERRLA, EOMs, conjunctiva no swelling or erythema Sinuses: No Frontal/maxillary tenderness ENT/Mouth: Ext aud canals clear, TMs without erythema, bulging. Right TM, serous noted behind TM. No erythema, swelling, or exudate on post pharynx.  Tonsils not swollen or erythematous. Hearing normal.  Neck: Supple, thyroid normal.  Respiratory: Respiratory effort normal, BS equal bilaterally without rales, rhonchi, wheezing or stridor.  Cardio: RRR, 4/6 systolic murmur noted. Brisk peripheral pulses +1 edema bilaterally.   Abdomen: Soft, + BS.  Non tender, no guarding, rebound, hernias, masses. Lymphatics: Non tender without lymphadenopathy.  Musculoskeletal: Full ROM, 5/5 strength, Antalgic gait. Skin: Warm, dry without rashes, lesions, ecchymosis.  Neuro: Cranial nerves intact. No cerebellar symptoms.  Psych: Awake and oriented X 3, normal affect, Insight and Judgment appropriate.    Izora Ribas, NP 9:57 AM [facility]

## 2018-05-16 ENCOUNTER — Ambulatory Visit (INDEPENDENT_AMBULATORY_CARE_PROVIDER_SITE_OTHER): Payer: Medicare Other | Admitting: Adult Health

## 2018-05-16 ENCOUNTER — Encounter: Payer: Self-pay | Admitting: Adult Health

## 2018-05-16 VITALS — BP 120/70 | HR 74 | Temp 97.4°F | Ht 64.0 in | Wt 204.0 lb

## 2018-05-16 DIAGNOSIS — I10 Essential (primary) hypertension: Secondary | ICD-10-CM | POA: Diagnosis not present

## 2018-05-16 DIAGNOSIS — N183 Chronic kidney disease, stage 3 unspecified: Secondary | ICD-10-CM

## 2018-05-16 DIAGNOSIS — J449 Chronic obstructive pulmonary disease, unspecified: Secondary | ICD-10-CM

## 2018-05-16 DIAGNOSIS — E559 Vitamin D deficiency, unspecified: Secondary | ICD-10-CM

## 2018-05-16 DIAGNOSIS — E039 Hypothyroidism, unspecified: Secondary | ICD-10-CM | POA: Diagnosis not present

## 2018-05-16 DIAGNOSIS — R7303 Prediabetes: Secondary | ICD-10-CM

## 2018-05-16 DIAGNOSIS — J4489 Other specified chronic obstructive pulmonary disease: Secondary | ICD-10-CM

## 2018-05-16 DIAGNOSIS — Z1211 Encounter for screening for malignant neoplasm of colon: Secondary | ICD-10-CM

## 2018-05-16 DIAGNOSIS — I2721 Secondary pulmonary arterial hypertension: Secondary | ICD-10-CM | POA: Diagnosis not present

## 2018-05-16 DIAGNOSIS — R7309 Other abnormal glucose: Secondary | ICD-10-CM | POA: Diagnosis not present

## 2018-05-16 DIAGNOSIS — E782 Mixed hyperlipidemia: Secondary | ICD-10-CM | POA: Diagnosis not present

## 2018-05-16 DIAGNOSIS — M109 Gout, unspecified: Secondary | ICD-10-CM

## 2018-05-16 DIAGNOSIS — I5042 Chronic combined systolic (congestive) and diastolic (congestive) heart failure: Secondary | ICD-10-CM | POA: Diagnosis not present

## 2018-05-16 DIAGNOSIS — Z79899 Other long term (current) drug therapy: Secondary | ICD-10-CM

## 2018-05-16 DIAGNOSIS — I482 Chronic atrial fibrillation, unspecified: Secondary | ICD-10-CM | POA: Diagnosis not present

## 2018-05-16 DIAGNOSIS — K219 Gastro-esophageal reflux disease without esophagitis: Secondary | ICD-10-CM

## 2018-05-16 LAB — COMPLETE METABOLIC PANEL WITH GFR
AG Ratio: 1.7 (calc) (ref 1.0–2.5)
ALBUMIN MSPROF: 4.1 g/dL (ref 3.6–5.1)
ALT: 14 U/L (ref 6–29)
AST: 16 U/L (ref 10–35)
Alkaline phosphatase (APISO): 38 U/L (ref 37–153)
BUN/Creatinine Ratio: 15 (calc) (ref 6–22)
BUN: 24 mg/dL (ref 7–25)
CO2: 26 mmol/L (ref 20–32)
Calcium: 10.6 mg/dL — ABNORMAL HIGH (ref 8.6–10.4)
Chloride: 98 mmol/L (ref 98–110)
Creat: 1.65 mg/dL — ABNORMAL HIGH (ref 0.60–0.93)
GFR, Est African American: 35 mL/min/{1.73_m2} — ABNORMAL LOW (ref >=60)
GFR, Est Non African American: 30 mL/min/{1.73_m2} — ABNORMAL LOW (ref >=60)
Globulin: 2.4 g/dL (calc) (ref 1.9–3.7)
Glucose, Bld: 71 mg/dL (ref 65–99)
POTASSIUM: 4.5 mmol/L (ref 3.5–5.3)
Sodium: 137 mmol/L (ref 135–146)
Total Bilirubin: 0.7 mg/dL (ref 0.2–1.2)
Total Protein: 6.5 g/dL (ref 6.1–8.1)

## 2018-05-16 LAB — PROTEIN ELECTROPHORESIS, SERUM, WITH REFLEX
Albumin ELP: 3.8 g/dL (ref 3.8–4.8)
Alpha 1: 0.4 g/dL — ABNORMAL HIGH (ref 0.2–0.3)
Alpha 2: 0.9 g/dL (ref 0.5–0.9)
Beta 2: 0.4 g/dL (ref 0.2–0.5)
Beta Globulin: 0.4 g/dL (ref 0.4–0.6)
Gamma Globulin: 0.7 g/dL — ABNORMAL LOW (ref 0.8–1.7)
Total Protein: 6.5 g/dL (ref 6.1–8.1)

## 2018-05-16 LAB — IGG, IGA, IGM
IGG (IMMUNOGLOBIN G), SERUM: 656 mg/dL (ref 600–1540)
IgM, Serum: 98 mg/dL (ref 50–300)
Immunoglobulin A: 106 mg/dL (ref 70–320)

## 2018-05-16 LAB — KAPPA/LAMBDA LIGHT CHAINS
KAPPA FREE LGHT CHN: 25.3 mg/L — AB (ref 3.3–19.4)
Kappa:Lambda Ratio: 1.37 (ref 0.26–1.65)
Lambda Free Lght Chn: 18.5 mg/L (ref 5.7–26.3)

## 2018-05-16 LAB — CBC WITH DIFFERENTIAL/PLATELET
Absolute Monocytes: 928 cells/uL (ref 200–950)
Basophils Absolute: 64 cells/uL (ref 0–200)
Basophils Relative: 0.7 %
Eosinophils Absolute: 137 cells/uL (ref 15–500)
Eosinophils Relative: 1.5 %
HCT: 43.8 % (ref 35.0–45.0)
Hemoglobin: 14.9 g/dL (ref 11.7–15.5)
Lymphs Abs: 2175 cells/uL (ref 850–3900)
MCH: 30.4 pg (ref 27.0–33.0)
MCHC: 34 g/dL (ref 32.0–36.0)
MCV: 89.4 fL (ref 80.0–100.0)
MPV: 11.2 fL (ref 7.5–12.5)
Monocytes Relative: 10.2 %
Neutro Abs: 5797 cells/uL (ref 1500–7800)
Neutrophils Relative %: 63.7 %
Platelets: 226 10*3/uL (ref 140–400)
RBC: 4.9 10*6/uL (ref 3.80–5.10)
RDW: 14.4 % (ref 11.0–15.0)
Total Lymphocyte: 23.9 %
WBC: 9.1 10*3/uL (ref 3.8–10.8)

## 2018-05-16 LAB — VITAMIN B12: Vitamin B-12: 478 pg/mL (ref 200–1100)

## 2018-05-16 LAB — IFE INTERPRETATION

## 2018-05-16 LAB — MAGNESIUM: Magnesium: 2.1 mg/dL (ref 1.5–2.5)

## 2018-05-16 NOTE — Patient Instructions (Addendum)
Goals    . Weight (lb) < 185 lb (83.9 kg)        Cut back on torsemide to 2 tabs twice a day  Do one more day of metolazone   Then do torsemide only - monitor weights, restart metolazone as needed if weights trending up     Do the following things EVERYDAY: 1) Weigh yourself in the morning before breakfast or at the same time every day. Write it down and keep it in a log. 2) Take your medicines as prescribed 3) Eat low salt foods-Limit salt (sodium) to 2000 mg per day. Best thing to do is avoid processed foods.   4) Stay as active as you can everyday 5) Limit all fluids for the day to less than 1.5 liters  Call your doctor if:  Anytime you have any of the following symptoms:  1) 2 pound weight gain in 24 hours or 5 pounds in 1 week  2) shortness of breath, with or without a dry hacking cough  3) swelling in the hands, LEGs, feet or stomach  4) if you have to sleep on extra pillows at night in order to breathe. 5) after laying down at night for 20-30 mins, you wake up short of breath.   These can all be signs of fluid overload.    Heart Failure Heart failure means your heart has trouble pumping blood. This makes it hard for your body to work well. Heart failure is usually a long-term (chronic) condition. You must take good care of yourself and follow your doctor's treatment plan. Follow these instructions at home:  Take your heart medicine as told by your doctor. ? Do not stop taking medicine unless your doctor tells you to. ? Do not skip any dose of medicine. ? Refill your medicines before they run out. ? Take other medicines only as told by your doctor or pharmacist.  Stay active if told by your doctor. The elderly and people with severe heart failure should talk with a doctor about physical activity.  Eat heart-healthy foods. Choose foods that are without trans fat and are low in saturated fat, cholesterol, and salt (sodium). This includes fresh or frozen fruits and  vegetables, fish, lean meats, fat-free or low-fat dairy foods, whole grains, and high-fiber foods. Lentils and dried peas and beans (legumes) are also good choices.  Limit salt if told by your doctor.  Cook in a healthy way. Roast, grill, broil, bake, poach, steam, or stir-fry foods.  Limit fluids as told by your doctor.  Weigh yourself every morning. Do this after you pee (urinate) and before you eat breakfast. Write down your weight to give to your doctor.  Take your blood pressure and write it down if your doctor tells you to.  Ask your doctor how to check your pulse. Check your pulse as told.  Lose weight if told by your doctor.  Stop smoking or chewing tobacco. Do not use gum or patches that help you quit without your doctor's approval.  Schedule and go to doctor visits as told.  Nonpregnant women should have no more than 1 drink a day. Men should have no more than 2 drinks a day. Talk to your doctor about drinking alcohol.  Stop illegal drug use.  Stay current with shots (immunizations).  Manage your health conditions as told by your doctor.  Learn to manage your stress.  Rest when you are tired.  If it is really hot outside: ? Avoid intense activities. ?  Use air conditioning or fans, or get in a cooler place. ? Avoid caffeine and alcohol. ? Wear loose-fitting, lightweight, and light-colored clothing.  If it is really cold outside: ? Avoid intense activities. ? Layer your clothing. ? Wear mittens or gloves, a hat, and a scarf when going outside. ? Avoid alcohol.  Learn about heart failure and get support as needed.  Get help to maintain or improve your quality of life and your ability to care for yourself as needed. Contact a doctor if:  You gain weight quickly.  You are more short of breath than usual.  You cannot do your normal activities.  You tire easily.  You cough more than normal, especially with activity.  You have any or more puffiness  (swelling) in areas such as your hands, feet, ankles, or belly (abdomen).  You cannot sleep because it is hard to breathe.  You feel like your heart is beating fast (palpitations).  You get dizzy or light-headed when you stand up. Get help right away if:  You have trouble breathing.  There is a change in mental status, such as becoming less alert or not being able to focus.  You have chest pain or discomfort.  You faint. This information is not intended to replace advice given to you by your health care provider. Make sure you discuss any questions you have with your health care provider. Document Released: 12/09/2007 Document Revised: 08/07/2015 Document Reviewed: 04/17/2012 Elsevier Interactive Patient Education  2017 [employer].

## 2018-05-17 ENCOUNTER — Encounter: Payer: Self-pay | Admitting: Adult Health

## 2018-05-17 LAB — COMPLETE METABOLIC PANEL WITH GFR
AG Ratio: 1.8 (calc) (ref 1.0–2.5)
ALT: 13 U/L (ref 6–29)
AST: 17 U/L (ref 10–35)
Albumin: 4.2 g/dL (ref 3.6–5.1)
Alkaline phosphatase (APISO): 40 U/L (ref 37–153)
BUN/Creatinine Ratio: 20 (calc) (ref 6–22)
BUN: 53 mg/dL — ABNORMAL HIGH (ref 7–25)
CHLORIDE: 93 mmol/L — AB (ref 98–110)
CO2: 30 mmol/L (ref 20–32)
Calcium: 11.4 mg/dL — ABNORMAL HIGH (ref 8.6–10.4)
Creat: 2.67 mg/dL — ABNORMAL HIGH (ref 0.60–0.93)
GFR, Est African American: 19 mL/min/{1.73_m2} — ABNORMAL LOW (ref >=60)
GFR, Est Non African American: 17 mL/min/{1.73_m2} — ABNORMAL LOW (ref >=60)
Globulin: 2.3 g/dL (calc) (ref 1.9–3.7)
Glucose, Bld: 133 mg/dL — ABNORMAL HIGH (ref 65–99)
Potassium: 4 mmol/L (ref 3.5–5.3)
Sodium: 139 mmol/L (ref 135–146)
Total Bilirubin: 0.6 mg/dL (ref 0.2–1.2)
Total Protein: 6.5 g/dL (ref 6.1–8.1)

## 2018-05-17 LAB — CBC WITH DIFFERENTIAL/PLATELET
Absolute Monocytes: 1078 cells/uL — ABNORMAL HIGH (ref 200–950)
Basophils Absolute: 49 cells/uL (ref 0–200)
Basophils Relative: 0.5 %
Eosinophils Absolute: 216 cells/uL (ref 15–500)
Eosinophils Relative: 2.2 %
HCT: 44.9 % (ref 35.0–45.0)
Hemoglobin: 15.2 g/dL (ref 11.7–15.5)
Lymphs Abs: 2597 cells/uL (ref 850–3900)
MCH: 30.8 pg (ref 27.0–33.0)
MCHC: 33.9 g/dL (ref 32.0–36.0)
MCV: 91.1 fL (ref 80.0–100.0)
MPV: 11 fL (ref 7.5–12.5)
Monocytes Relative: 11 %
Neutro Abs: 5860 cells/uL (ref 1500–7800)
Neutrophils Relative %: 59.8 %
Platelets: 223 10*3/uL (ref 140–400)
RBC: 4.93 10*6/uL (ref 3.80–5.10)
RDW: 14.5 % (ref 11.0–15.0)
TOTAL LYMPHOCYTE: 26.5 %
WBC: 9.8 10*3/uL (ref 3.8–10.8)

## 2018-05-17 LAB — MAGNESIUM: Magnesium: 2.1 mg/dL (ref 1.5–2.5)

## 2018-05-17 LAB — TSH: TSH: 3.81 mIU/L (ref 0.40–4.50)

## 2018-05-17 LAB — LIPID PANEL
Cholesterol: 169 mg/dL (ref <200)
HDL: 31 mg/dL — ABNORMAL LOW (ref >=50)
LDL Cholesterol (Calc): 96 mg/dL (calc)
Non-HDL Cholesterol (Calc): 138 mg/dL (calc) — ABNORMAL HIGH (ref <130)
Total CHOL/HDL Ratio: 5.5 (calc) — ABNORMAL HIGH (ref <5.0)
Triglycerides: 315 mg/dL — ABNORMAL HIGH (ref <150)

## 2018-05-19 ENCOUNTER — Other Ambulatory Visit: Payer: Self-pay | Admitting: Physician Assistant

## 2018-05-19 ENCOUNTER — Encounter (INDEPENDENT_AMBULATORY_CARE_PROVIDER_SITE_OTHER): Payer: Self-pay | Admitting: Family Medicine

## 2018-05-19 ENCOUNTER — Ambulatory Visit (INDEPENDENT_AMBULATORY_CARE_PROVIDER_SITE_OTHER): Payer: Medicare Other | Admitting: Family Medicine

## 2018-05-19 DIAGNOSIS — D472 Monoclonal gammopathy: Secondary | ICD-10-CM

## 2018-05-19 DIAGNOSIS — G8929 Other chronic pain: Secondary | ICD-10-CM

## 2018-05-19 DIAGNOSIS — M5441 Lumbago with sciatica, right side: Secondary | ICD-10-CM

## 2018-05-19 MED ORDER — BACLOFEN 10 MG PO TABS: 10.0000 mg | ORAL_TABLET | Freq: Every evening | ORAL | 3 refills | Status: DC | PRN

## 2018-05-19 MED ORDER — LIDOCAINE 5 % EX PTCH: 1.0000 | MEDICATED_PATCH | Freq: Every day | CUTANEOUS | 6 refills | Status: DC | PRN

## 2018-05-19 NOTE — Progress Notes (Signed)
Office Visit Note   Patient: Katie [name]           Date of Birth: 1940-12-24           MRN: [id number] Visit Date: 05/19/2018 Requested by: [name], [address] [place], [place] [address] PCP: [name], MD  Subjective: Chief Complaint  Patient presents with  . Lower Back - Pain    Pain starting in right buttock area, across the lower back and down the right leg. Has had this pain x 1 year, but worsened last night.    HPI: She is here with worsening low back and right leg pain.  Symptoms of sciatica for the past year.  Pain got worse last night, no injury.  Pain in the midline lumbosacral spine radiating into the posterior hip to the lateral hip and then down toward the knee.  She has not noticed any new numbness or weakness.  She does have a history of peripheral neuropathy, not diabetes related.               ROS: Otherwise noncontributory  Objective: Vital Signs: There were no vitals taken for this visit.  Physical Exam:  Low back: No visible rash.  Slightly tender over the L5-S1 level.  Moderately tender in the sciatic notch.  Moderately tender over the greater trochanter.  No pain with internal hip rotation or with straight leg raise.  Lower extremity strength and reflexes are normal.  Imaging: None today.  Assessment & Plan: 1.  Worsening low back and right leg pain, symptoms suggest sciatica. -She has a defibrillator and is unable to have MRI scan. -We will try physical therapy, lidocaine patches and baclofen as needed. -Referral to Dr. Ernestina Patches for epidural injection if she fails to improve. -Lumbar CT would be another consideration.     Procedures: No procedures performed  No notes on file     PMFS History: Patient Active Problem List   Diagnosis Date Noted  . Hypercalcemia 05/17/2018  . Prediabetes 02/02/2018  . COPD (chronic obstructive pulmonary disease) with chronic bronchitis ([place]) 02/02/2018  . Post-traumatic  arthritis of ankle, left 11/29/2017  . Shortness of breath   . Aortic atherosclerosis ([place]) 10/17/2017  . Depression 09/21/2017  . Bradycardia 09/21/2017  . PAH (pulmonary artery hypertension) ([place]) 06/23/2017  . Esophageal dysphagia 05/15/2017  . Gout 03/20/2017  . Abnormal glucose 09/19/2015  . AAA (abdominal aortic aneurysm) without rupture ([place]) 04/22/2015  . Morbid obesity ([place]) 08/03/2014  . PVD (peripheral vascular disease) with claudication ([place]) 10/16/2013  . CKD (chronic kidney disease) stage 3, GFR 30-59 ml/min (HCC) 06/08/2013  . Hyperlipidemia, mixed 06/08/2013  . 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 Long term current use of anticoagulant therapy 04/23/2013  . Vitamin D deficiency 02/15/2013  . Hypothyroidism   . Osteopenia   . Chronic combined systolic and diastolic CHF (congestive heart failure) ([place]) 11/25/2008  . Migraine headache 11/22/2008  . Essential hypertension 11/22/2008  . Atherosclerosis of native coronary artery of native heart without angina pectoris 11/22/2008  . Atrial fibrillation ([place]) 11/22/2008  . Pulmonary emphysema ([place]) 11/22/2008  . Gastroesophageal reflux disease without esophagitis 11/22/2008   Past Medical History:  Diagnosis Date  . AAA (abdominal aortic aneurysm) ([place])   . AICD (automatic cardioverter/defibrillator) present 11/10/2017  . Arthritis    "some in my knees" (11/10/2017)  . Atrial fibrillation ([place])   . CHF (congestive heart failure) ([place])   . Chronic bronchitis ([place])   .  COPD (chronic obstructive pulmonary disease) ([place])    'they say I don't; have a problem breathing though" (11/10/2017)  . Depression   . GERD (gastroesophageal reflux disease)   . Gout    "daily RX" (11/10/2017)  . History of hiatal hernia   . Hyperlipidemia   . Hypertension   . Hypothyroid   . Migraine headache    "hx; none since 1980s" (11/10/2017)  . Osteopenia   . Pneumonia    "~ 3 times" (11/10/2017)  . PVD (peripheral  vascular disease) (HCC)     Family History  Problem Relation Age of Onset  . Cirrhosis Mother   . Cancer Mother 65       PANCREAS  . Heart defect Sister   . Breast cancer Sister        age 69  . Heart disease Sister   . Stroke Sister   . Alcohol abuse Father   . Depression Father   . Hypertension Brother   . Hyperlipidemia Son   . Heart disease Daughter     Past Surgical History:  Procedure Laterality Date  . ABDOMINAL AORTIC ENDOVASCULAR STENT GRAFT N/A 04/11/2013   Procedure: ABDOMINAL AORTIC ENDOVASCULAR STENT GRAFT WITH RIGHT FEMORAL PATCH ANGIOPLASTY;  Surgeon: Mal Misty, MD;  Location: [facility];  Service: Vascular;  Laterality: N/A;  . BIV ICD INSERTION CRT-D  11/10/2017  . BIV ICD INSERTION CRT-D N/A 11/10/2017   Procedure: BIV ICD INSERTION CRT-D;  Surgeon: Evans Lance, MD;  Location: [facility];  Service: Cardiovascular;  Laterality: N/A;  . BREAST SURGERY     LEFT BREAST BIOPSY  . CARDIAC CATHETERIZATION    . CATARACT EXTRACTION W/ INTRAOCULAR LENS  IMPLANT, BILATERAL Bilateral   . CHOLECYSTECTOMY OPEN  1972  . COLONOSCOPY    . EYE SURGERY Bilateral    "bleeding in my eyes"  . FRACTURE SURGERY    . TIBIA FRACTURE SURGERY Left 1970s  . TONSILLECTOMY    . VARICOSE VEIN SURGERY Bilateral    "laser"   Social History   Occupational History  . Not on file  Tobacco Use  . Smoking status: Former Smoker    Packs/day: 2.00    Years: 54.00    Pack years: 108.00    Types: Cigarettes    Last attempt to quit: 08/10/2002    Years since quitting: 15.7  . Smokeless tobacco: Never Used  Substance and Sexual Activity  . Alcohol use: Yes    Comment: 11/10/2017 "glass of wine q 3-4 months"  . Drug use: Never  . Sexual activity: Yes    Birth control/protection: Post-menopausal

## 2018-05-22 ENCOUNTER — Encounter: Payer: Self-pay | Admitting: Cardiology

## 2018-05-22 NOTE — Progress Notes (Deleted)
FOLLOW UP 1 week  Assessment and Plan:    Chronic combined systolic and diastolic congestive heart failure (HCC) Weights trending down with addition of metolazone *** Decrease torsemide to 40 mg BID as previously instructed  Will follow up once again in week to evaluate progress after stopping metolazone  Disease process and medications discussed. Questions answered fully. Emphasized salt restriction, less than 2000mg  a day. Encouraged daily monitoring of the patient's weight, call office if 5 lb weight loss or gain in a day.  Encouraged regular exercise. If any increasing shortness of breath, swelling, or chest pressure go to ER immediately.  decrease your fluid intake to less than 2 L daily please remember to always increase your potassium intake with any increase of your fluid pill.   CKD  stage III/IV (HCC) Increase fluids, avoid NSAIDS, monitor sugars, will monitor Will check CMP, PTH never checked, will do today  Refer to nephrology if not not recovering  Hypertension Well controlled with current medications  Follows with Cardiology Monitor blood pressure at home; patient to call if consistently greater than 130/80 Continue DASH diet.   Reminder to go to the ER if any CP, SOB, nausea, dizziness, severe HA, changes vision/speech, left arm numbness and tingling and jaw pain.   Continue diet and meds as discussed. Further disposition pending results of labs. Discussed med's effects and SE's.   Over 15 minutes of exam, counseling, chart review, and critical decision making was performed.   Future Appointments  Date Time Provider [place]  05/23/2018  9:30 AM Mcarthur Rossetti, MD [facility] None  05/24/2018 10:30 AM Liane Comber, NP [facility] None  08/23/2018  2:00 PM Liane Comber, NP [facility] None  02/27/2019  9:00 AM [name], MD [facility] None     ----------------------------------------------------------------------------------------------------------------------  HPI 78 y.o. female  presents for 1 month follow up on CHF with recent weight gain, htn, CKD III/IV.   BMI is There is no height or weight on file to calculate BMI., she has not been working on diet and exercise related to ankle pain and impaired mobility.  She has a history of AAA s/p repair, a. Fib with chadsvasc of 2 on xarelto, difibrillator implantation in 05/6642, Combined Systolic and Diastolic CHF (ECHO 0/3474 EF 35-40%), followed by Dr. Lovena Le denies dyspnea on exertion and paroxysmal nocturnal dyspnea. Positive for lower extremity edema and orthopnea.  She was seen 3 weeks ago for concerns with persistent edema and weight gain (7#). Her demadex was increase to 80mg  in AM and 40 in afternoon, and followed up with improved breathing and weights down but with persistent edema. She also c/o of persistent parasthesias in bilateral hands and feet, treated by gabapentin; protein was down at that time with normal UA. Recent BNP was 158, which was up from a year ago but not as high as it has been previous. Serum protein was noted to have declined with normal UA; she had mildly elevated kappa light chains at 25.3;  IgG, IgA, IgM were WNL. Electrophoresis showed possible small bands and she has been referred to hematology by Vicie Mutters, PA.   Metalozone 2.5 mg was added to be taken prior to dermadex 40 mg BID x 3 days then PRN; she reported breathing was better, weights down significantly at home but still with some residual swelling. She had been taking metolazone in addition to dermadex 80 mg AM and 40 mg PM.  However, significant renal function decline was noted on labs (GFR 30>17, Cr 1.65 <  2.67) - she was advised to hold metolazone and resume dermadex 40 mg BID.     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 Melvyn Novas.   Her blood pressure has been controlled at home, today their BP is    She does not workout. She denies chest pain, shortness of breath, dizziness.   Lab Results  Component Value Date   GFRNONAA 17 (L) 05/16/2018   GFRNONAA 30 (L) 05/11/2018   GFRNONAA 35 (L) 05/04/2018   Lab Results  Component Value Date   CREATININE 2.67 (H) 05/16/2018   CREATININE 1.65 (H) 05/11/2018   CREATININE 1.46 (H) 05/04/2018       Current Medications:  Current Outpatient Medications on File Prior to Visit  Medication Sig  . acetaminophen (TYLENOL) 500 MG tablet Take 1,000 mg by mouth every 6 (six) hours as needed for moderate pain.   Marland Kitchen albuterol (PROAIR HFA) 108 (90 Base) MCG/ACT inhaler 2 inhalations 10-15 minutes apart evert 4 hours as needed for Asthma Flare (Patient taking differently: Inhale 2 puffs into the lungs every 4 (four) hours as needed for shortness of breath (asthma flare). )  . allopurinol (ZYLOPRIM) 300 MG tablet TAKE 1 TABLET BY MOUTH EVERY DAY  . aspirin EC 81 MG tablet Take 81 mg by mouth daily.  . baclofen (LIORESAL) 10 MG tablet Take 1 tablet (10 mg total) by mouth at bedtime as needed for muscle spasms.  . cetirizine (ZYRTEC) 10 MG tablet Take 10 mg by mouth as needed.   . Cholecalciferol (VITAMIN D3) 5000 units CAPS Take 5,000 Units by mouth daily.   . digoxin (LANOXIN) 0.125 MG tablet Take 1 tablet (0.125 mg total) by mouth daily.  . Eyelid Cleansers (AVENOVA) 0.01 % SOLN APPLY A SMALL AMOUNT TO SKIN TWICE A DAY AS DIRECTED  . gabapentin (NEURONTIN) 600 MG tablet Take 300 mg by mouth 3 (three) times daily.  Marland Kitchen guaiFENesin (MUCINEX PO) Take 1 tablet by mouth daily as needed (runny nose).  Marland Kitchen ipratropium (ATROVENT) 0.06 % nasal spray Use 1 to 2 sprays each Nostril 2 to 3 x / day as needed  . isosorbide  mononitrate (IMDUR) 30 MG 24 hr tablet TAKE 1 TABLET DAILY FOR BP & HEART  . levothyroxine (SYNTHROID) 100 MCG tablet Take 1 tablet daily on an empty stomach with only water for 30 minutes.  . lidocaine (LIDODERM) 5 % Place 1 patch onto the skin daily as needed.  . metolazone (ZAROXOLYN) 2.5 MG tablet 1 pill 30 mins prior to fluid pill as needed/directed  . metoprolol succinate (TOPROL-XL) 50 MG 24 hr tablet Take 1 tablet (50 mg total) by mouth daily. Take with or immediately following a meal.  . montelukast (SINGULAIR) 10 MG tablet Take 1 tablet daily for Allergies & Asthma  . pantoprazole (PROTONIX) 40 MG tablet Take 1 tablet (40 mg total) by mouth daily.  [name] Glycol-Propyl Glycol (SYSTANE OP) Place 1 drop into both eyes 4 (four) times daily as needed (dry eyes).   . potassium chloride SA (KLOR-CON M20) 20 MEQ tablet TAKE 2 TABLETS (40 MEQ TOTAL) BY MOUTH DAILY.  . pravastatin (PRAVACHOL) 40 MG tablet Take 40 mg by mouth daily.  . prednisoLONE acetate (PRED FORTE) 1 % ophthalmic suspension Place 1 drop into  both eyes 2 (two) times daily.   . predniSONE (DELTASONE) 10 MG tablet TAKE 2TABS 3 TIMES A DAY X3 DAYS, 2TABS TWICE A DAY X3 DAYS, 2TABS ONCE DAILY X5 DAYS  . prochlorperazine (COMPAZINE) 5 MG tablet TAKE 1 TABLET BY MOUTH THREE TIMES A DAY AS NEEDED FOR VERTIGO OR NAUSEA (Patient taking differently: Take 5 mg by mouth daily as needed for nausea (vertigo). )  . rivaroxaban (XARELTO) 20 MG TABS tablet Take 1 tablet (20 mg total) by mouth daily with supper.  . torsemide (DEMADEX) 20 MG tablet TAKE 2 TABLETS (40 MG TOTAL) BY MOUTH 2 (TWO) TIMES DAILY.  Marland Kitchen triamcinolone cream (KENALOG) 0.1 % Apply 1 application topically daily.    No current facility-administered medications on file prior to visit.      Allergies:  Allergies  Allergen Reactions  . Amiodarone Other (See Comments)    PULMONARY TOXICITY  . Diovan [Valsartan] Other (See Comments)    HYPOTENSION  . Doxycycline  Diarrhea and Other (See Comments)    VISUAL DISTURBANCE  . Flexeril [Cyclobenzaprine] Other (See Comments)    FATIGUE  . Keflex [Cephalexin] Diarrhea  . Verapamil Other (See Comments)    EDEMA  . Codeine Hives     Medical History:  Past Medical History:  Diagnosis Date  . AAA (abdominal aortic aneurysm) ([place])   . AICD (automatic cardioverter/defibrillator) present 11/10/2017  . Arthritis    "some in my knees" (11/10/2017)  . Atrial fibrillation ([place])   . CHF (congestive heart failure) ([place])   . Chronic bronchitis ([place])   . COPD (chronic obstructive pulmonary disease) ([place])    'they say I don't; have a problem breathing though" (11/10/2017)  . Depression   . GERD (gastroesophageal reflux disease)   . Gout    "daily RX" (11/10/2017)  . History of hiatal hernia   . Hyperlipidemia   . Hypertension   . Hypothyroid   . Migraine headache    "hx; none since 1980s" (11/10/2017)  . Osteopenia   . Pneumonia    "~ 3 times" (11/10/2017)  . PVD (peripheral vascular disease) ([place])    Family history- Reviewed and unchanged Social history- Reviewed and unchanged   Review of Systems:  Review of Systems  Constitutional: Negative for chills, diaphoresis, fever, malaise/fatigue and weight loss.  HENT: Negative for congestion, ear discharge, ear pain, hearing loss, nosebleeds, sinus pain, sore throat and tinnitus.   Eyes: Negative for blurred vision, double vision, pain, discharge and redness.  Respiratory: Negative for cough, hemoptysis, sputum production, shortness of breath and wheezing.   Cardiovascular: Positive for leg swelling (significantly improved). Negative for chest pain, palpitations, orthopnea, claudication and PND.       Mild but resolves over night.  Gastrointestinal: Negative for abdominal pain, blood in stool, constipation, diarrhea, heartburn, melena, nausea and vomiting.  Genitourinary: Negative for dysuria, flank pain, frequency, hematuria and urgency.  Musculoskeletal:  Positive for back pain and joint pain. Negative for falls, myalgias and neck pain.       Left ankle pain  Skin: Negative for itching and rash.       Top of left foot  Neurological: Negative for dizziness, tingling, tremors, seizures, loss of consciousness, weakness and headaches.  Endo/Heme/Allergies: Positive for environmental allergies. Negative for polydipsia. Does not bruise/bleed easily.  Psychiatric/Behavioral: Negative for depression, memory loss and suicidal ideas. The patient is not nervous/anxious and does not have insomnia.      Physical Exam: There were no vitals taken for this visit. Wt  Readings from Last 3 Encounters:  05/16/18 204 lb (92.5 kg)  05/12/18 205 lb 9.3 oz (93.2 kg)  05/11/18 208 lb 6.4 oz (94.5 kg)   General Appearance: Well nourished, in no apparent distress. Eyes: PERRLA, EOMs, conjunctiva no swelling or erythema Sinuses: No Frontal/maxillary tenderness ENT/Mouth: Ext aud canals clear, TMs without erythema, bulging. Right TM, serous noted behind TM. No erythema, swelling, or exudate on post pharynx.  Tonsils not swollen or erythematous. Hearing normal.  Neck: Supple, thyroid normal.  Respiratory: Respiratory effort normal, BS equal bilaterally without rales, rhonchi, wheezing or stridor.  Cardio: RRR, 4/6 systolic murmur noted. Brisk peripheral pulses +1 edema bilaterally.   Abdomen: Soft, + BS.  Non tender, no guarding, rebound, hernias, masses. Lymphatics: Non tender without lymphadenopathy.  Musculoskeletal: Full ROM, 5/5 strength, Antalgic gait. Skin: Warm, dry without rashes, lesions, ecchymosis.  Neuro: Cranial nerves intact. No cerebellar symptoms.  Psych: Awake and oriented X 3, normal affect, Insight and Judgment appropriate.    Izora Ribas, NP 8:20 AM [facility]

## 2018-05-22 NOTE — Progress Notes (Signed)
Remote ICD transmission.   

## 2018-05-23 ENCOUNTER — Ambulatory Visit (INDEPENDENT_AMBULATORY_CARE_PROVIDER_SITE_OTHER): Payer: Medicare Other | Admitting: Orthopaedic Surgery

## 2018-05-23 ENCOUNTER — Emergency Department (HOSPITAL_COMMUNITY): Payer: Medicare Other

## 2018-05-23 ENCOUNTER — Observation Stay (HOSPITAL_BASED_OUTPATIENT_CLINIC_OR_DEPARTMENT_OTHER): Payer: Medicare Other

## 2018-05-23 ENCOUNTER — Other Ambulatory Visit: Payer: Self-pay

## 2018-05-23 ENCOUNTER — Observation Stay (HOSPITAL_COMMUNITY)
Admission: EM | Admit: 2018-05-23 | Discharge: 2018-05-24 | Disposition: A | Discharge status: Home / Self Care | Payer: Medicare Other | Attending: Internal Medicine | Admitting: Internal Medicine

## 2018-05-23 ENCOUNTER — Encounter (HOSPITAL_COMMUNITY): Payer: Self-pay | Admitting: Emergency Medicine

## 2018-05-23 DIAGNOSIS — E039 Hypothyroidism, unspecified: Secondary | ICD-10-CM | POA: Diagnosis present

## 2018-05-23 DIAGNOSIS — E876 Hypokalemia: Secondary | ICD-10-CM | POA: Diagnosis not present

## 2018-05-23 DIAGNOSIS — Z79890 Hormone replacement therapy (postmenopausal): Secondary | ICD-10-CM | POA: Insufficient documentation

## 2018-05-23 DIAGNOSIS — I351 Nonrheumatic aortic (valve) insufficiency: Secondary | ICD-10-CM | POA: Diagnosis not present

## 2018-05-23 DIAGNOSIS — R945 Abnormal results of liver function studies: Secondary | ICD-10-CM | POA: Insufficient documentation

## 2018-05-23 DIAGNOSIS — Z885 Allergy status to narcotic agent status: Secondary | ICD-10-CM | POA: Insufficient documentation

## 2018-05-23 DIAGNOSIS — N179 Acute kidney failure, unspecified: Secondary | ICD-10-CM

## 2018-05-23 DIAGNOSIS — R0902 Hypoxemia: Secondary | ICD-10-CM | POA: Insufficient documentation

## 2018-05-23 DIAGNOSIS — I4821 Permanent atrial fibrillation: Secondary | ICD-10-CM | POA: Diagnosis not present

## 2018-05-23 DIAGNOSIS — I10 Essential (primary) hypertension: Secondary | ICD-10-CM | POA: Diagnosis not present

## 2018-05-23 DIAGNOSIS — R079 Chest pain, unspecified: Secondary | ICD-10-CM | POA: Diagnosis not present

## 2018-05-23 DIAGNOSIS — F329 Major depressive disorder, single episode, unspecified: Secondary | ICD-10-CM | POA: Insufficient documentation

## 2018-05-23 DIAGNOSIS — I714 Abdominal aortic aneurysm, without rupture: Secondary | ICD-10-CM | POA: Insufficient documentation

## 2018-05-23 DIAGNOSIS — K219 Gastro-esophageal reflux disease without esophagitis: Secondary | ICD-10-CM | POA: Diagnosis present

## 2018-05-23 DIAGNOSIS — J449 Chronic obstructive pulmonary disease, unspecified: Secondary | ICD-10-CM | POA: Diagnosis present

## 2018-05-23 DIAGNOSIS — I361 Nonrheumatic tricuspid (valve) insufficiency: Secondary | ICD-10-CM | POA: Diagnosis not present

## 2018-05-23 DIAGNOSIS — I739 Peripheral vascular disease, unspecified: Secondary | ICD-10-CM | POA: Diagnosis not present

## 2018-05-23 DIAGNOSIS — Z8249 Family history of ischemic heart disease and other diseases of the circulatory system: Secondary | ICD-10-CM | POA: Insufficient documentation

## 2018-05-23 DIAGNOSIS — Z818 Family history of other mental and behavioral disorders: Secondary | ICD-10-CM | POA: Insufficient documentation

## 2018-05-23 DIAGNOSIS — R0602 Shortness of breath: Secondary | ICD-10-CM | POA: Diagnosis not present

## 2018-05-23 DIAGNOSIS — Z881 Allergy status to other antibiotic agents status: Secondary | ICD-10-CM | POA: Insufficient documentation

## 2018-05-23 DIAGNOSIS — I482 Chronic atrial fibrillation, unspecified: Secondary | ICD-10-CM | POA: Insufficient documentation

## 2018-05-23 DIAGNOSIS — Z7901 Long term (current) use of anticoagulants: Secondary | ICD-10-CM | POA: Insufficient documentation

## 2018-05-23 DIAGNOSIS — I130 Hypertensive heart and chronic kidney disease with heart failure and stage 1 through stage 4 chronic kidney disease, or unspecified chronic kidney disease: Secondary | ICD-10-CM | POA: Diagnosis not present

## 2018-05-23 DIAGNOSIS — N184 Chronic kidney disease, stage 4 (severe): Secondary | ICD-10-CM | POA: Diagnosis not present

## 2018-05-23 DIAGNOSIS — N183 Chronic kidney disease, stage 3 unspecified: Secondary | ICD-10-CM | POA: Diagnosis present

## 2018-05-23 DIAGNOSIS — Z87891 Personal history of nicotine dependence: Secondary | ICD-10-CM | POA: Diagnosis not present

## 2018-05-23 DIAGNOSIS — Z9049 Acquired absence of other specified parts of digestive tract: Secondary | ICD-10-CM | POA: Diagnosis not present

## 2018-05-23 DIAGNOSIS — M109 Gout, unspecified: Secondary | ICD-10-CM | POA: Diagnosis not present

## 2018-05-23 DIAGNOSIS — J438 Other emphysema: Secondary | ICD-10-CM | POA: Diagnosis not present

## 2018-05-23 DIAGNOSIS — Z79899 Other long term (current) drug therapy: Secondary | ICD-10-CM | POA: Insufficient documentation

## 2018-05-23 DIAGNOSIS — M8580 Other specified disorders of bone density and structure, unspecified site: Secondary | ICD-10-CM | POA: Diagnosis not present

## 2018-05-23 DIAGNOSIS — Z95810 Presence of automatic (implantable) cardiac defibrillator: Secondary | ICD-10-CM | POA: Diagnosis not present

## 2018-05-23 DIAGNOSIS — M542 Cervicalgia: Secondary | ICD-10-CM | POA: Diagnosis not present

## 2018-05-23 DIAGNOSIS — I447 Left bundle-branch block, unspecified: Secondary | ICD-10-CM | POA: Diagnosis not present

## 2018-05-23 DIAGNOSIS — E782 Mixed hyperlipidemia: Secondary | ICD-10-CM | POA: Diagnosis not present

## 2018-05-23 DIAGNOSIS — M549 Dorsalgia, unspecified: Secondary | ICD-10-CM | POA: Insufficient documentation

## 2018-05-23 DIAGNOSIS — Z888 Allergy status to other drugs, medicaments and biological substances status: Secondary | ICD-10-CM | POA: Insufficient documentation

## 2018-05-23 DIAGNOSIS — R0789 Other chest pain: Secondary | ICD-10-CM | POA: Diagnosis not present

## 2018-05-23 DIAGNOSIS — I5042 Chronic combined systolic (congestive) and diastolic (congestive) heart failure: Secondary | ICD-10-CM | POA: Diagnosis not present

## 2018-05-23 DIAGNOSIS — I2510 Atherosclerotic heart disease of native coronary artery without angina pectoris: Secondary | ICD-10-CM | POA: Diagnosis present

## 2018-05-23 DIAGNOSIS — Z7982 Long term (current) use of aspirin: Secondary | ICD-10-CM | POA: Insufficient documentation

## 2018-05-23 DIAGNOSIS — I4891 Unspecified atrial fibrillation: Secondary | ICD-10-CM | POA: Diagnosis present

## 2018-05-23 LAB — BASIC METABOLIC PANEL
Anion gap: 17 — ABNORMAL HIGH (ref 5–15)
BUN: 58 mg/dL — ABNORMAL HIGH (ref 8–23)
CO2: 28 mmol/L (ref 22–32)
Calcium: 10.3 mg/dL (ref 8.9–10.3)
Chloride: 88 mmol/L — ABNORMAL LOW (ref 98–111)
Creatinine, Ser: 2.2 mg/dL — ABNORMAL HIGH (ref 0.44–1.00)
GFR calc Af Amer: 24 mL/min — ABNORMAL LOW (ref >60)
GFR, EST NON AFRICAN AMERICAN: 21 mL/min — AB (ref >60)
Glucose, Bld: 131 mg/dL — ABNORMAL HIGH (ref 70–99)
POTASSIUM: 2.8 mmol/L — AB (ref 3.5–5.1)
Sodium: 133 mmol/L — ABNORMAL LOW (ref 135–145)

## 2018-05-23 LAB — I-STAT TROPONIN, ED
TROPONIN I, POC: 0.06 ng/mL (ref 0.00–0.08)
Troponin i, poc: 0.05 ng/mL (ref 0.00–0.08)

## 2018-05-23 LAB — HEPATIC FUNCTION PANEL
ALT: 14 U/L (ref 0–44)
AST: 60 U/L — AB (ref 15–41)
Albumin: 3.8 g/dL (ref 3.5–5.0)
Alkaline Phosphatase: 33 U/L — ABNORMAL LOW (ref 38–126)
Bilirubin, Direct: 0.8 mg/dL — ABNORMAL HIGH (ref 0.0–0.2)
Indirect Bilirubin: 1.5 mg/dL — ABNORMAL HIGH (ref 0.3–0.9)
Total Bilirubin: 2.3 mg/dL — ABNORMAL HIGH (ref 0.3–1.2)
Total Protein: 6.4 g/dL — ABNORMAL LOW (ref 6.5–8.1)

## 2018-05-23 LAB — LIPASE, BLOOD: Lipase: 33 U/L (ref 11–51)

## 2018-05-23 LAB — CBC
HCT: 48.8 % — ABNORMAL HIGH (ref 36.0–46.0)
HEMOGLOBIN: 15.9 g/dL — AB (ref 12.0–15.0)
MCH: 30.1 pg (ref 26.0–34.0)
MCHC: 32.6 g/dL (ref 30.0–36.0)
MCV: 92.4 fL (ref 80.0–100.0)
Platelets: 213 10*3/uL (ref 150–400)
RBC: 5.28 MIL/uL — ABNORMAL HIGH (ref 3.87–5.11)
RDW: 15.2 % (ref 11.5–15.5)
WBC: 8.6 10*3/uL (ref 4.0–10.5)
nRBC: 0 % (ref 0.0–0.2)

## 2018-05-23 LAB — BRAIN NATRIURETIC PEPTIDE: B Natriuretic Peptide: 63.4 pg/mL (ref 0.0–100.0)

## 2018-05-23 LAB — ECHOCARDIOGRAM COMPLETE

## 2018-05-23 LAB — TROPONIN I
Troponin I: 0.05 ng/mL (ref <0.03)
Troponin I: 0.05 ng/mL (ref <0.03)

## 2018-05-23 LAB — DIGOXIN LEVEL: Digoxin Level: 0.9 ng/mL (ref 0.8–2.0)

## 2018-05-23 MED ORDER — ASPIRIN EC 81 MG PO TBEC
81.0000 mg | DELAYED_RELEASE_TABLET | Freq: Every day | ORAL | Status: DC
  Administered 2018-05-23 – 2018-05-24 (×2): 81 mg via ORAL
  Filled 2018-05-23 (×2): qty 1

## 2018-05-23 MED ORDER — DIGOXIN 125 MCG PO TABS
0.1250 mg | ORAL_TABLET | Freq: Every day | ORAL | Status: DC
  Administered 2018-05-23 – 2018-05-24 (×2): 0.125 mg via ORAL
  Filled 2018-05-23 (×2): qty 1

## 2018-05-23 MED ORDER — METOPROLOL SUCCINATE ER 50 MG PO TB24
50.0000 mg | ORAL_TABLET | Freq: Every day | ORAL | Status: DC
  Administered 2018-05-23: 50 mg via ORAL
  Filled 2018-05-23: qty 2

## 2018-05-23 MED ORDER — PREDNISOLONE ACETATE 1 % OP SUSP
1.0000 [drp] | Freq: Two times a day (BID) | OPHTHALMIC | Status: DC
  Administered 2018-05-23 – 2018-05-24 (×2): 1 [drp] via OPHTHALMIC
  Filled 2018-05-23: qty 5

## 2018-05-23 MED ORDER — PROCHLORPERAZINE MALEATE 5 MG PO TABS
5.0000 mg | ORAL_TABLET | Freq: Every day | ORAL | Status: DC | PRN
  Filled 2018-05-23: qty 1

## 2018-05-23 MED ORDER — MONTELUKAST SODIUM 10 MG PO TABS
10.0000 mg | ORAL_TABLET | Freq: Every day | ORAL | Status: DC
  Administered 2018-05-23: 10 mg via ORAL
  Filled 2018-05-23: qty 1

## 2018-05-23 MED ORDER — MORPHINE SULFATE (PF) 4 MG/ML IV SOLN
4.0000 mg | Freq: Once | INTRAVENOUS | Status: AC
  Administered 2018-05-23: 4 mg via INTRAVENOUS
  Filled 2018-05-23: qty 1

## 2018-05-23 MED ORDER — ISOSORBIDE MONONITRATE ER 30 MG PO TB24
30.0000 mg | ORAL_TABLET | Freq: Every day | ORAL | Status: DC
  Administered 2018-05-23 – 2018-05-24 (×2): 30 mg via ORAL
  Filled 2018-05-23 (×2): qty 1

## 2018-05-23 MED ORDER — NITROGLYCERIN 2 % TD OINT
1.0000 [in_us] | TOPICAL_OINTMENT | Freq: Once | TRANSDERMAL | Status: AC
  Administered 2018-05-23: 1 [in_us] via TOPICAL
  Filled 2018-05-23: qty 1

## 2018-05-23 MED ORDER — LEVOTHYROXINE SODIUM 100 MCG PO TABS
100.0000 ug | ORAL_TABLET | Freq: Every day | ORAL | Status: DC
  Administered 2018-05-24: 100 ug via ORAL
  Filled 2018-05-23: qty 1

## 2018-05-23 MED ORDER — PROMETHAZINE HCL 25 MG/ML IJ SOLN
12.5000 mg | Freq: Four times a day (QID) | INTRAMUSCULAR | Status: DC | PRN
  Administered 2018-05-23: 12.5 mg via INTRAVENOUS
  Filled 2018-05-23: qty 1

## 2018-05-23 MED ORDER — LEVOTHYROXINE SODIUM 100 MCG PO TABS: 100.0000 ug | ORAL_TABLET | Freq: Every day | ORAL | Status: DC

## 2018-05-23 MED ORDER — SODIUM CHLORIDE 0.9% FLUSH: 3.0000 mL | Freq: Once | INTRAVENOUS | Status: DC

## 2018-05-23 MED ORDER — POTASSIUM CHLORIDE 10 MEQ/100ML IV SOLN
10.0000 meq | Freq: Once | INTRAVENOUS | Status: AC
  Administered 2018-05-23: 10 meq via INTRAVENOUS
  Filled 2018-05-23: qty 100

## 2018-05-23 MED ORDER — PRAVASTATIN SODIUM 40 MG PO TABS
40.0000 mg | ORAL_TABLET | Freq: Every day | ORAL | Status: DC
  Administered 2018-05-23 – 2018-05-24 (×2): 40 mg via ORAL
  Filled 2018-05-23 (×2): qty 1

## 2018-05-23 MED ORDER — TORSEMIDE 20 MG PO TABS
40.0000 mg | ORAL_TABLET | Freq: Two times a day (BID) | ORAL | Status: DC
  Administered 2018-05-23 – 2018-05-24 (×2): 40 mg via ORAL
  Filled 2018-05-23 (×2): qty 2

## 2018-05-23 MED ORDER — NITROGLYCERIN 0.4 MG SL SUBL
0.4000 mg | SUBLINGUAL_TABLET | SUBLINGUAL | Status: AC | PRN
  Administered 2018-05-23 (×3): 0.4 mg via SUBLINGUAL
  Filled 2018-05-23: qty 1

## 2018-05-23 MED ORDER — RIVAROXABAN 20 MG PO TABS: 20.0000 mg | ORAL_TABLET | Freq: Every day | ORAL | Status: DC

## 2018-05-23 MED ORDER — POTASSIUM CHLORIDE CRYS ER 20 MEQ PO TBCR
40.0000 meq | EXTENDED_RELEASE_TABLET | Freq: Two times a day (BID) | ORAL | Status: DC
  Administered 2018-05-23 – 2018-05-24 (×3): 40 meq via ORAL
  Filled 2018-05-23 (×3): qty 2

## 2018-05-23 MED ORDER — ONDANSETRON HCL 4 MG/2ML IJ SOLN
4.0000 mg | Freq: Four times a day (QID) | INTRAMUSCULAR | Status: DC | PRN
  Administered 2018-05-23: 4 mg via INTRAVENOUS
  Filled 2018-05-23: qty 2

## 2018-05-23 MED ORDER — ALBUTEROL SULFATE (2.5 MG/3ML) 0.083% IN NEBU: 2.5000 mg | INHALATION_SOLUTION | RESPIRATORY_TRACT | Status: DC | PRN

## 2018-05-23 MED ORDER — ONDANSETRON HCL 4 MG/2ML IJ SOLN
4.0000 mg | Freq: Once | INTRAMUSCULAR | Status: AC
  Administered 2018-05-23: 4 mg via INTRAVENOUS
  Filled 2018-05-23: qty 2

## 2018-05-23 MED ORDER — BACLOFEN 10 MG PO TABS: 10.0000 mg | ORAL_TABLET | Freq: Every evening | ORAL | Status: DC | PRN

## 2018-05-23 MED ORDER — GABAPENTIN 300 MG PO CAPS
300.0000 mg | ORAL_CAPSULE | Freq: Three times a day (TID) | ORAL | Status: DC
  Administered 2018-05-23 – 2018-05-24 (×3): 300 mg via ORAL
  Filled 2018-05-23 (×3): qty 1

## 2018-05-23 MED ORDER — LIDOCAINE 5 % EX PTCH
1.0000 | MEDICATED_PATCH | Freq: Every day | CUTANEOUS | Status: DC | PRN
  Administered 2018-05-23: 1 via TRANSDERMAL
  Filled 2018-05-23: qty 1

## 2018-05-23 MED ORDER — VITAMIN D 25 MCG (1000 UNIT) PO TABS
5000.0000 [IU] | ORAL_TABLET | Freq: Every day | ORAL | Status: DC
  Administered 2018-05-23: 5000 [IU] via ORAL
  Filled 2018-05-23: qty 5

## 2018-05-23 MED ORDER — ALLOPURINOL 300 MG PO TABS
300.0000 mg | ORAL_TABLET | Freq: Every day | ORAL | Status: DC
  Administered 2018-05-23 – 2018-05-24 (×2): 300 mg via ORAL
  Filled 2018-05-23 (×2): qty 1

## 2018-05-23 MED ORDER — PANTOPRAZOLE SODIUM 40 MG PO TBEC
40.0000 mg | DELAYED_RELEASE_TABLET | Freq: Every day | ORAL | Status: DC
  Administered 2018-05-23 – 2018-05-24 (×2): 40 mg via ORAL
  Filled 2018-05-23 (×2): qty 1

## 2018-05-23 MED ORDER — ACETAMINOPHEN 500 MG PO TABS: 500.0000 mg | ORAL_TABLET | Freq: Four times a day (QID) | ORAL | Status: DC | PRN

## 2018-05-23 MED ORDER — RIVAROXABAN 15 MG PO TABS
15.0000 mg | ORAL_TABLET | Freq: Every day | ORAL | Status: DC
  Administered 2018-05-23: 15 mg via ORAL
  Filled 2018-05-23: qty 1

## 2018-05-23 NOTE — ED Triage Notes (Signed)
Pt c/o central chest pressure and shortness of breath that started at 1800 last night. Hx afib

## 2018-05-23 NOTE — ED Provider Notes (Signed)
[facility] EMERGENCY DEPARTMENT Provider Note   CSN: [id number] Arrival date & time: 05/23/18  0149    History   Chief Complaint Chief Complaint  Patient presents with  . Chest Pain  . Shortness of Breath    HPI Katie [name] is a 78 y.o. female.     Patient is a 78 year old female with multiple medical problems including AAA, CHF with an EF of 35 to 40% with an AICD, chronic atrial fibrillation on Xarelto, COPD, chronic kidney disease, hypertension and hyperlipidemia who is presenting to the emergency room with new onset of chest pain.  Patient states that last night she started having discomfort in the lower central portion of her chest.  Discomfort did not improve but only worsened and started radiating into her back and her neck.  She states it makes her feel short of breath, generally weak and nauseated.  She has not taken anything for the pain but is not gotten any better.  It was not associated with eating and she was just sitting when the discomfort started.  She denies cough, fever or cold-like symptoms.  She does admit over the last few weeks to a month she has had worsening swelling in her lower extremities.  She states she is up about 5 pounds despite taking her normal diuretics which are torsemide and metolazone.  She states that his medications have not changed.  She denies any diarrhea or rashes.  No medication changes in the last few weeks.  No prior history of stent placement but she does have a murmur.  The history is provided by the patient.  Chest Pain  Pain location:  Substernal area Pain quality: radiating, shooting and tightness   Pain radiates to:  Mid back and neck Pain severity:  Moderate Onset quality:  Gradual Duration:  6 hours Timing:  Constant Progression:  Waxing and waning Chronicity:  New Context comment:  Started while watching TV last night.  Was not associated with eating or exertion Relieved by:  None tried Worsened by:   Nothing Ineffective treatments:  None tried Associated symptoms: anorexia, lower extremity edema, nausea, shortness of breath and weakness   Associated symptoms: no abdominal pain, no cough, no dizziness, no fever, no headache, no heartburn, no palpitations and no vomiting   Shortness of Breath  Associated symptoms: chest pain   Associated symptoms: no abdominal pain, no cough, no fever, no headaches and no vomiting     Past Medical History:  Diagnosis Date  . AAA (abdominal aortic aneurysm) ([place])   . AICD (automatic cardioverter/defibrillator) present 11/10/2017  . Arthritis    "some in my knees" (11/10/2017)  . Atrial fibrillation ([place])   . CHF (congestive heart failure) ([place])   . Chronic bronchitis ([place])   . COPD (chronic obstructive pulmonary disease) ([place])    'they say I don't; have a problem breathing though" (11/10/2017)  . Depression   . GERD (gastroesophageal reflux disease)   . Gout    "daily RX" (11/10/2017)  . History of hiatal hernia   . Hyperlipidemia   . Hypertension   . Hypothyroid   . Migraine headache    "hx; none since 1980s" (11/10/2017)  . Osteopenia   . Pneumonia    "~ 3 times" (11/10/2017)  . PVD (peripheral vascular disease) [facility])     Patient Active Problem List   Diagnosis Date Noted  . Hypercalcemia 05/17/2018  . Prediabetes 02/02/2018  . COPD (chronic obstructive pulmonary disease) with chronic bronchitis ([place])  02/02/2018  . Post-traumatic arthritis of ankle, left 11/29/2017  . Shortness of breath   . Aortic atherosclerosis ([place]) 10/17/2017  . Depression 09/21/2017  . Bradycardia 09/21/2017  . PAH (pulmonary artery hypertension) ([place]) 06/23/2017  . Esophageal dysphagia 05/15/2017  . Gout 03/20/2017  . Abnormal glucose 09/19/2015  . AAA (abdominal aortic aneurysm) without rupture ([place]) 04/22/2015  . Morbid obesity ([place]) 08/03/2014  . PVD (peripheral vascular disease) with claudication ([place]) 10/16/2013  . CKD (chronic kidney disease) stage 3,  GFR 30-59 ml/min ([facility]) 06/08/2013  . Hyperlipidemia, mixed 06/08/2013  . 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 Long term current use of anticoagulant therapy 04/23/2013  . Vitamin D deficiency 02/15/2013  . Hypothyroidism   . Osteopenia   . Chronic combined systolic and diastolic CHF (congestive heart failure) ([place]) 11/25/2008  . Migraine headache 11/22/2008  . Essential hypertension 11/22/2008  . Atherosclerosis of native coronary artery of native heart without angina pectoris 11/22/2008  . Atrial fibrillation ([place]) 11/22/2008  . Pulmonary emphysema ([place]) 11/22/2008  . Gastroesophageal reflux disease without esophagitis 11/22/2008    Past Surgical History:  Procedure Laterality Date  . ABDOMINAL AORTIC ENDOVASCULAR STENT GRAFT N/A 04/11/2013   Procedure: ABDOMINAL AORTIC ENDOVASCULAR STENT GRAFT WITH RIGHT FEMORAL PATCH ANGIOPLASTY;  Surgeon: Mal Misty, MD;  Location: [facility];  Service: Vascular;  Laterality: N/A;  . BIV ICD INSERTION CRT-D  11/10/2017  . BIV ICD INSERTION CRT-D N/A 11/10/2017   Procedure: BIV ICD INSERTION CRT-D;  Surgeon: Evans Lance, MD;  Location: [facility];  Service: Cardiovascular;  Laterality: N/A;  . BREAST SURGERY     LEFT BREAST BIOPSY  . CARDIAC CATHETERIZATION    . CATARACT EXTRACTION W/ INTRAOCULAR LENS  IMPLANT, BILATERAL Bilateral   . CHOLECYSTECTOMY OPEN  1972  . COLONOSCOPY    . EYE SURGERY Bilateral    "bleeding in my eyes"  . FRACTURE SURGERY    . TIBIA FRACTURE SURGERY Left 1970s  . TONSILLECTOMY    . VARICOSE VEIN SURGERY Bilateral    "laser"     OB History    Gravida  4   Para  3   Term  3   Preterm      AB  1   Living  3     SAB      TAB      Ectopic      Multiple      Live Births               Home Medications    Prior to Admission medications   Medication Sig Start Date End Date Taking? Authorizing Provider  acetaminophen (TYLENOL) 500 MG tablet Take 1,000 mg by mouth  every 6 (six) hours as needed for moderate pain.     [provider]  albuterol (PROAIR HFA) 108 (90 Base) MCG/ACT inhaler 2 inhalations 10-15 minutes apart evert 4 hours as needed for Asthma Flare Patient taking differently: Inhale 2 puffs into the lungs every 4 (four) hours as needed for shortness of breath (asthma flare).  07/20/17   [name], MD  allopurinol (ZYLOPRIM) 300 MG tablet TAKE 1 TABLET BY MOUTH EVERY DAY 04/13/18   [name], MD  aspirin EC 81 MG tablet Take 81 mg by mouth daily.    [provider]  baclofen (LIORESAL) 10 MG tablet Take 1 tablet (10 mg total) by mouth at bedtime as needed for muscle spasms. 05/19/18   Hilts, Legrand Como, MD  cetirizine (  ZYRTEC) 10 MG tablet Take 10 mg by mouth as needed.     [provider]  Cholecalciferol (VITAMIN D3) 5000 units CAPS Take 5,000 Units by mouth daily.     [provider]  digoxin (LANOXIN) 0.125 MG tablet Take 1 tablet (0.125 mg total) by mouth daily. 01/05/18   Garnet Sierras, NP  Eyelid Cleansers (AVENOVA) 0.01 % SOLN APPLY A SMALL AMOUNT TO SKIN TWICE A DAY AS DIRECTED 11/28/17   [provider]  gabapentin (NEURONTIN) 600 MG tablet Take 300 mg by mouth 3 (three) times daily.    [provider]  guaiFENesin (MUCINEX PO) Take 1 tablet by mouth daily as needed (runny nose).    [provider]  ipratropium (ATROVENT) 0.06 % nasal spray Use 1 to 2 sprays each Nostril 2 to 3 x / day as needed 11/22/17 11/23/18  [name], MD  isosorbide mononitrate (IMDUR) 30 MG 24 hr tablet TAKE 1 TABLET DAILY FOR BP & HEART 04/12/18   Liane Comber, NP  levothyroxine (SYNTHROID) 100 MCG tablet Take 1 tablet daily on an empty stomach with only water for 30 minutes. 02/03/18   [name], MD  lidocaine (LIDODERM) 5 % Place 1 patch onto the skin daily as needed. 05/19/18   Hilts, Legrand Como, MD  metolazone (ZAROXOLYN) 2.5 MG tablet 1 pill 30 mins prior to fluid pill as  needed/directed 05/11/18   Vicie Mutters, PA-C  metoprolol succinate (TOPROL-XL) 50 MG 24 hr tablet Take 1 tablet (50 mg total) by mouth daily. Take with or immediately following a meal. 11/23/17 05/16/18  Evans Lance, MD  montelukast (SINGULAIR) 10 MG tablet Take 1 tablet daily for Allergies & Asthma 02/02/18   [name], MD  pantoprazole (PROTONIX) 40 MG tablet Take 1 tablet (40 mg total) by mouth daily. 07/16/16   Ghimire, Henreitta Leber, MD  Polyethyl Glycol-Propyl Glycol (SYSTANE OP) Place 1 drop into both eyes 4 (four) times daily as needed (dry eyes).     [provider]  potassium chloride SA (KLOR-CON M20) 20 MEQ tablet TAKE 2 TABLETS (40 MEQ TOTAL) BY MOUTH DAILY. 12/01/17   Vicie Mutters, PA-C  pravastatin (PRAVACHOL) 40 MG tablet Take 40 mg by mouth daily.    [provider]  prednisoLONE acetate (PRED FORTE) 1 % ophthalmic suspension Place 1 drop into both eyes 2 (two) times daily.  04/27/17   [provider]  predniSONE (DELTASONE) 10 MG tablet TAKE 2TABS 3 TIMES A DAY X3 DAYS, 2TABS TWICE A DAY X3 DAYS, 2TABS ONCE DAILY X5 DAYS 03/30/18   [provider]  prochlorperazine (COMPAZINE) 5 MG tablet TAKE 1 TABLET BY MOUTH THREE TIMES A DAY AS NEEDED FOR VERTIGO OR NAUSEA Patient taking differently: Take 5 mg by mouth daily as needed for nausea (vertigo).  10/09/17   [name], MD  rivaroxaban (XARELTO) 20 MG TABS tablet Take 1 tablet (20 mg total) by mouth daily with supper. 12/08/17   Vicie Mutters, PA-C  torsemide (DEMADEX) 20 MG tablet TAKE 2 TABLETS (40 MG TOTAL) BY MOUTH 2 (TWO) TIMES DAILY. 02/24/18   Evans Lance, MD  triamcinolone cream (KENALOG) 0.1 % Apply 1 application topically daily.     [provider]    Family History Family History  Problem Relation Age of Onset  . Cirrhosis Mother   . Cancer Mother 52       PANCREAS  . Heart defect Sister   . Breast cancer Sister  age 18  . Heart disease Sister   .  Stroke Sister   . Alcohol abuse Father   . Depression Father   . Hypertension Brother   . Hyperlipidemia Son   . Heart disease Daughter     Social History Social History   Tobacco Use  . Smoking status: Former Smoker    Packs/day: 2.00    Years: 54.00    Pack years: 108.00    Types: Cigarettes    Last attempt to quit: 08/10/2002    Years since quitting: 15.7  . Smokeless tobacco: Never Used  Substance Use Topics  . Alcohol use: Yes    Comment: 11/10/2017 "glass of wine q 3-4 months"  . Drug use: Never     Allergies   Amiodarone; Diovan [valsartan]; Doxycycline; Flexeril [cyclobenzaprine]; Keflex [cephalexin]; Verapamil; and Codeine   Review of Systems Review of Systems  Constitutional: Negative for fever.  Respiratory: Positive for shortness of breath. Negative for cough.   Cardiovascular: Positive for chest pain. Negative for palpitations.  Gastrointestinal: Positive for anorexia and nausea. Negative for abdominal pain, heartburn and vomiting.  Neurological: Positive for weakness. Negative for dizziness and headaches.  All other systems reviewed and are negative.    Physical Exam Updated Vital Signs BP (!) 120/99 (BP Location: Left Arm)   Pulse 88   Temp 98.3 F (36.8 C) (Oral)   Resp 15   SpO2 98%   Physical Exam Vitals signs and nursing note reviewed.  Constitutional:      General: She is not in acute distress.    Appearance: She is well-developed. She is obese.     Comments: Appears uncomfortable  HENT:     Head: Normocephalic and atraumatic.  Eyes:     Pupils: Pupils are equal, round, and reactive to light.  Cardiovascular:     Rate and Rhythm: Tachycardia present. Rhythm irregularly irregular.     Heart sounds: Murmur present. Systolic murmur present with a grade of 3/6. No friction rub.  Pulmonary:     Effort: Pulmonary effort is normal.     Breath sounds: Normal breath sounds. No wheezing or rales.  Abdominal:     General: Bowel sounds are  normal. There is no distension.     Palpations: Abdomen is soft.     Tenderness: There is no abdominal tenderness. There is no guarding or rebound.  Musculoskeletal: Normal range of motion.        General: No tenderness.     Right lower leg: 2+ Edema present.     Left lower leg: 2+ Edema present.     Comments: No edema  Skin:    General: Skin is warm and dry.     Capillary Refill: Capillary refill takes 2 to 3 seconds.     Findings: No rash.  Neurological:     General: No focal deficit present.     Mental Status: She is alert and oriented to person, place, and time. Mental status is at baseline.     Cranial Nerves: No cranial nerve deficit.  Psychiatric:        Mood and Affect: Mood normal.        Behavior: Behavior normal.      ED Treatments / Results  Labs (all labs ordered are listed, but only abnormal results are displayed) Labs Reviewed  BASIC METABOLIC PANEL - Abnormal; Notable for the following components:      Result Value   Sodium 133 (*)    Potassium 2.8 (*)  Chloride 88 (*)    Glucose, Bld 131 (*)    BUN 58 (*)    Creatinine, Ser 2.20 (*)    GFR calc non Af Amer 21 (*)    GFR calc Af Amer 24 (*)    Anion gap 17 (*)    All other components within normal limits  CBC - Abnormal; Notable for the following components:   RBC 5.28 (*)    Hemoglobin 15.9 (*)    HCT 48.8 (*)    All other components within normal limits  BRAIN NATRIURETIC PEPTIDE  I-STAT TROPONIN, ED  I-STAT TROPONIN, ED  I-STAT TROPONIN, ED    EKG EKG Interpretation  Date/Time:  Tuesday May 23 2018 07:16:14 EDT Ventricular Rate:  92 PR Interval:    QRS Duration: 152 QT Interval:  414 QTC Calculation: 513 R Axis:   45 Text Interpretation:  Atrial fibrillation Ventricular premature complex Left bundle branch block Prolonged QT interval similar to tracing on 10/30/17 Confirmed by Blanchie Dessert ([id number]) on 05/23/2018 7:25:56 AM   Radiology Dg Chest 2 View  Result Date:  05/23/2018 CLINICAL DATA:  Mid chest pain tonight. EXAM: CHEST - 2 VIEW COMPARISON:  11/11/2017 FINDINGS: Cardiac pacemaker. Diffuse cardiac enlargement. No vascular congestion, edema, or consolidation. No blunting of costophrenic angles. No pneumothorax. Mediastinal contours appear intact. Calcification of the aorta. IMPRESSION: Cardiac enlargement. No evidence of active pulmonary disease. Electronically Signed   By: Lucienne Capers M.D.   On: 05/23/2018 02:25    Procedures Procedures (including critical care time)  Medications Ordered in ED Medications  sodium chloride flush (NS) 0.9 % injection 3 mL (3 mLs Intravenous Not Given 05/23/18 0710)  nitroGLYCERIN (NITROSTAT) SL tablet 0.4 mg (0.4 mg Sublingual Given 05/23/18 0753)  potassium chloride 10 mEq in 100 mL IVPB (10 mEq Intravenous New Bag/Given 05/23/18 0802)     Initial Impression / Assessment and Plan / ED Course  I have reviewed the triage vital signs and the nursing notes.  Pertinent labs & imaging results that were available during my care of the patient were reviewed by me and considered in my medical decision making (see chart for details).       Elderly female with extensive past history presenting with chest pain concerning for ACS versus strain from CHF.  Could be GI in nature as well.  Lower suspicion for dissection or ruptured AAA.  Low suspicion for PE as patient is anticoagulated and has no prior history of DVT.  Patient's initial troponin is 0.06.  EKG showed A. fib with RVR without significant findings.  Repeat EKG shows atrial fibrillation now rate controlled with left bundle branch block which is also similar to prior EKGs.  Chest x-ray with cardiomegaly but no other acute findings.  Patient today has worsening renal function of 2.2 and elevated BUN.  This is been a slow trend over the last few months patient's creatinine has been increasing.  She is also hypokalemic today at 2.8 which may be related to diuretics.  Pt  given NTG and potassium.  Repeat trop wnl.  9:05 AM Initially nitroglycerin helped with her pain but it returned and she received a second nitroglycerin.  After that her pain did return and she points to it in the lower portion of her chest into her back.  Vital signs have not changed and she remained stable.  She was given morphine for the pain.  Also will check LFTs and lipase but patient is status post cholecystectomy and has no  lower abdominal pain concerning for diverticulitis or appendicitis.  Final Clinical Impressions(s) / ED Diagnoses   Final diagnoses:  Atypical chest pain    ED Discharge Orders    None       Blanchie Dessert, MD 05/23/18 1039

## 2018-05-23 NOTE — Progress Notes (Signed)
  Echocardiogram 2D Echocardiogram has been performed.  Sherise Geerdes L Androw 05/23/2018, 2:14 PM

## 2018-05-23 NOTE — ED Notes (Signed)
Attempted report x1. 

## 2018-05-23 NOTE — ED Notes (Signed)
ED TO INPATIENT HANDOFF REPORT  ED Nurse Name and Phone #: Sherrine Maples [phone]  S Name/Age/Gender Katie [name] 78 y.o. female Room/Bed: 039C/039C  Code Status   Code Status: Full Code  Home/SNF/Other Home Patient oriented to: self, place, time and situation Is this baseline? Yes   Triage Complete: Triage complete  Chief Complaint Chest pain  Triage Note Pt c/o central chest pressure and shortness of breath that started at 1800 last night. Hx afib   Allergies Allergies  Allergen Reactions  . Amiodarone Other (See Comments)    PULMONARY TOXICITY  . Diovan [Valsartan] Other (See Comments)    HYPOTENSION  . Doxycycline Diarrhea and Other (See Comments)    VISUAL DISTURBANCE  . Flexeril [Cyclobenzaprine] Other (See Comments)    FATIGUE  . Keflex [Cephalexin] Diarrhea  . Verapamil Other (See Comments)    EDEMA  . Codeine Hives    Level of Care/Admitting Diagnosis ED Disposition    ED Disposition Condition [facility] Area: [place] [[id number]]  Level of Care: Cardiac Telemetry [103]  I expect the patient will be discharged within 24 hours: Yes  LOW acuity---Tx typically complete <24 hrs---ACUTE conditions typically can be evaluated <24 hours---LABS likely to return to acceptable levels <24 hours---IS near functional baseline---EXPECTED to return to current living arrangement---NOT newly hypoxic: Meets criteria for 5C-Observation unit  Diagnosis: Chest pain [841324]  Admitting Physician: Barb Merino [[id number]]  Attending Physician: Barb Merino [[id number]]  PT Class (Do Not Modify): Observation [104]  PT Acc Code (Do Not Modify): Observation [10022]       B Medical/Surgery History Past Medical History:  Diagnosis Date  . AAA (abdominal aortic aneurysm) ([place])   . AICD (automatic cardioverter/defibrillator) present 11/10/2017  . Arthritis    "some in my knees" (11/10/2017)  . Atrial fibrillation ([place])   . CHF (congestive heart  failure) ([place])   . Chronic bronchitis ([place])   . COPD (chronic obstructive pulmonary disease) ([place])    'they say I don't; have a problem breathing though" (11/10/2017)  . Depression   . GERD (gastroesophageal reflux disease)   . Gout    "daily RX" (11/10/2017)  . History of hiatal hernia   . Hyperlipidemia   . Hypertension   . Hypothyroid   . Migraine headache    "hx; none since 1980s" (11/10/2017)  . Osteopenia   . Pneumonia    "~ 3 times" (11/10/2017)  . PVD (peripheral vascular disease) ([place])    Past Surgical History:  Procedure Laterality Date  . ABDOMINAL AORTIC ENDOVASCULAR STENT GRAFT N/A 04/11/2013   Procedure: ABDOMINAL AORTIC ENDOVASCULAR STENT GRAFT WITH RIGHT FEMORAL PATCH ANGIOPLASTY;  Surgeon: Mal Misty, MD;  Location: [facility];  Service: Vascular;  Laterality: N/A;  . BIV ICD INSERTION CRT-D  11/10/2017  . BIV ICD INSERTION CRT-D N/A 11/10/2017   Procedure: BIV ICD INSERTION CRT-D;  Surgeon: Evans Lance, MD;  Location: [facility];  Service: Cardiovascular;  Laterality: N/A;  . BREAST SURGERY     LEFT BREAST BIOPSY  . CARDIAC CATHETERIZATION    . CATARACT EXTRACTION W/ INTRAOCULAR LENS  IMPLANT, BILATERAL Bilateral   . CHOLECYSTECTOMY OPEN  1972  . COLONOSCOPY    . EYE SURGERY Bilateral    "bleeding in my eyes"  . FRACTURE SURGERY    . TIBIA FRACTURE SURGERY Left 1970s  . TONSILLECTOMY    . VARICOSE VEIN SURGERY Bilateral    "laser"     A IV  Location/Drains/Wounds Patient Lines/Drains/Airways Status   Active Line/Drains/Airways    Name:   Placement date:   Placement time:   Site:   Days:   Peripheral IV 05/23/18 Right Hand   05/23/18    0754    Hand   less than 1   Incision (Closed) 11/10/17 Chest Left   11/10/17    2030     194          Intake/Output Last 24 hours  Intake/Output Summary (Last 24 hours) at 05/23/2018 1730 Last data filed at 05/23/2018 1005 Gross per 24 hour  Intake 100 ml  Output -  Net 100 ml    Labs/Imaging Results  for orders placed or performed during the hospital encounter of 05/23/18 (from the past 48 hour(s))  Basic metabolic panel     Status: Abnormal   Collection Time: 05/23/18  2:03 AM  Result Value Ref Range   Sodium 133 (L) 135 - 145 mmol/L   Potassium 2.8 (L) 3.5 - 5.1 mmol/L   Chloride 88 (L) 98 - 111 mmol/L   CO2 28 22 - 32 mmol/L   Glucose, Bld 131 (H) 70 - 99 mg/dL   BUN 58 (H) 8 - 23 mg/dL   Creatinine, Ser 2.20 (H) 0.44 - 1.00 mg/dL   Calcium 10.3 8.9 - 10.3 mg/dL   GFR calc non Af Amer 21 (L) >60 mL/min   GFR calc Af Amer 24 (L) >60 mL/min   Anion gap 17 (H) 5 - 15    Comment: Performed at [facility], [address] [place], [place] [address]  CBC     Status: Abnormal   Collection Time: 05/23/18  2:03 AM  Result Value Ref Range   WBC 8.6 4.0 - 10.5 K/uL   RBC 5.28 (H) 3.87 - 5.11 MIL/uL   Hemoglobin 15.9 (H) 12.0 - 15.0 g/dL   HCT 48.8 (H) 36.0 - 46.0 %   MCV 92.4 80.0 - 100.0 fL   MCH 30.1 26.0 - 34.0 pg   MCHC 32.6 30.0 - 36.0 g/dL   RDW 15.2 11.5 - 15.5 %   Platelets 213 150 - 400 K/uL   nRBC 0.0 0.0 - 0.2 %    Comment: Performed at [facility], [place] [address] [place], [place] [address]  Brain natriuretic peptide     Status: None   Collection Time: 05/23/18  2:03 AM  Result Value Ref Range   B Natriuretic Peptide 63.4 0.0 - 100.0 pg/mL    Comment: Performed at [facility] [address] [place], [place] [address]  Hepatic function panel     Status: Abnormal   Collection Time: 05/23/18  2:03 AM  Result Value Ref Range   Total Protein 6.4 (L) 6.5 - 8.1 g/dL   Albumin 3.8 3.5 - 5.0 g/dL   AST 60 (H) 15 - 41 U/L   ALT 14 0 - 44 U/L   Alkaline Phosphatase 33 (L) 38 - 126 U/L   Total Bilirubin 2.3 (H) 0.3 - 1.2 mg/dL   Bilirubin, Direct 0.8 (H) 0.0 - 0.2 mg/dL   Indirect Bilirubin 1.5 (H) 0.3 - 0.9 mg/dL    Comment: Performed at [facility] [address], [place], [place] [address]  Lipase, blood     Status: None   Collection Time:  05/23/18  2:03 AM  Result Value Ref Range   Lipase 33 11 - 51 U/L    Comment: Performed at [facility], [place] [address] [place], [place] [address]  I-stat troponin, ED     Status: None   Collection Time: 05/23/18  2:18 AM  Result Value Ref Range   Troponin i, poc 0.06 0.00 - 0.08 ng/mL   Comment 3            Comment: Due to the release kinetics of cTnI, a negative result within the first hours of the onset of symptoms does not rule out myocardial infarction with certainty. If myocardial infarction is still suspected, repeat the test at appropriate intervals.   I-Stat Troponin, ED (not at [facility])     Status: None   Collection Time: 05/23/18  7:31 AM  Result Value Ref Range   Troponin i, poc 0.05 0.00 - 0.08 ng/mL   Comment 3            Comment: Due to the release kinetics of cTnI, a negative result within the first hours of the onset of symptoms does not rule out myocardial infarction with certainty. If myocardial infarction is still suspected, repeat the test at appropriate intervals.   Troponin I - Now Then Q6H     Status: Abnormal   Collection Time: 05/23/18 11:41 AM  Result Value Ref Range   Troponin I 0.05 (HH) <0.03 ng/mL    Comment: CRITICAL RESULT CALLED TO, READ BACK BY AND VERIFIED WITHMelvenia Beam RN [phone] [phone] BY A BENNETT Performed at [facility], [address], [place], [place] [address]   Digoxin level     Status: None   Collection Time: 05/23/18 11:41 AM  Result Value Ref Range   Digoxin Level 0.9 0.8 - 2.0 ng/mL    Comment: Performed at [facility] [address], [place], [place] [address]   Dg Chest 2 View  Result Date: 05/23/2018 CLINICAL DATA:  Mid chest pain tonight. EXAM: CHEST - 2 VIEW COMPARISON:  11/11/2017 FINDINGS: Cardiac pacemaker. Diffuse cardiac enlargement. No vascular congestion, edema, or consolidation. No blunting of costophrenic angles. No pneumothorax. Mediastinal contours appear intact. Calcification of the  aorta. IMPRESSION: Cardiac enlargement. No evidence of active pulmonary disease. Electronically Signed   By: Lucienne Capers M.D.   On: 05/23/2018 02:25    Pending Labs Unresulted Labs (From admission, onward)    Start     Ordered   05/24/18 0500  Comprehensive metabolic panel  Tomorrow morning,   R     05/23/18 1459   05/23/18 1116  Troponin I - Now Then Q6H  Now then every 6 hours,   R     05/23/18 1115          Vitals/Pain Today's Vitals   05/23/18 1601 05/23/18 1615 05/23/18 1630 05/23/18 1645  BP: 119/85 118/60 126/74 135/67  Pulse: 97 70 70 70  Resp: (!) 24 17 16 18   Temp:      TempSrc:      SpO2: 95% 100% 100% 97%  PainSc:        Isolation Precautions No active isolations  Medications Medications  sodium chloride flush (NS) 0.9 % injection 3 mL (3 mLs Intravenous Not Given 05/23/18 0710)  acetaminophen (TYLENOL) tablet 500 mg (has no administration in time range)  allopurinol (ZYLOPRIM) tablet 300 mg (has no administration in time range)  aspirin EC tablet 81 mg (81 mg Oral Given 05/23/18 1650)  digoxin (LANOXIN) tablet 0.125 mg (0.125 mg Oral Given 05/23/18 1650)  isosorbide mononitrate (IMDUR) 24 hr tablet 30 mg (30 mg Oral  Given 05/23/18 1650)  metoprolol succinate (TOPROL-XL) 24 hr tablet 50 mg (50 mg Oral Given 05/23/18 1649)  pravastatin (PRAVACHOL) tablet 40 mg (40 mg Oral Given 05/23/18 1650)  torsemide (DEMADEX) tablet 40 mg (has no administration in time range)  prochlorperazine (COMPAZINE) tablet 5 mg (has no administration in time range)  levothyroxine (SYNTHROID, LEVOTHROID) tablet 100 mcg (has no administration in time range)  pantoprazole (PROTONIX) EC tablet 40 mg (40 mg Oral Given 05/23/18 1650)  rivaroxaban (XARELTO) tablet 20 mg (has no administration in time range)  baclofen (LIORESAL) tablet 10 mg (has no administration in time range)  gabapentin (NEURONTIN) capsule 300 mg (300 mg Oral Given 05/23/18 1648)  cholecalciferol (VITAMIN D3) tablet 5,000  Units (5,000 Units Oral Given 05/23/18 1646)  potassium chloride SA (K-DUR,KLOR-CON) CR tablet 40 mEq (40 mEq Oral Given 05/23/18 1641)  montelukast (SINGULAIR) tablet 10 mg (has no administration in time range)  lidocaine (LIDODERM) 5 % 1 patch (1 patch Transdermal Patch Applied 05/23/18 1250)  prednisoLONE acetate (PRED FORTE) 1 % ophthalmic suspension 1 drop (has no administration in time range)  albuterol (PROVENTIL) (2.5 MG/3ML) 0.083% nebulizer solution 2.5 mg (has no administration in time range)  promethazine (PHENERGAN) injection 12.5 mg (12.5 mg Intravenous Given 05/23/18 1508)  nitroGLYCERIN (NITROSTAT) SL tablet 0.4 mg (0.4 mg Sublingual Given 05/23/18 0755)  potassium chloride 10 mEq in 100 mL IVPB (0 mEq Intravenous Stopped 05/23/18 1005)  nitroGLYCERIN (NITROGLYN) 2 % ointment 1 inch (1 inch Topical Given 05/23/18 0854)  morphine 4 MG/ML injection 4 mg (4 mg Intravenous Given 05/23/18 0910)  ondansetron (ZOFRAN) injection 4 mg (4 mg Intravenous Given 05/23/18 0910)  morphine 4 MG/ML injection 4 mg (4 mg Intravenous Given 05/23/18 1113)    Mobility walks Low fall risk   Focused Assessments Cardiac Assessment Handoff:  Cardiac Rhythm: Atrial fibrillation Lab Results  Component Value Date   TROPONINI 0.05 ([place]) 05/23/2018   No results found for: DDIMER Does the Patient currently have chest pain? No     R Recommendations: See Admitting Provider Note  Report given to:   Additional Notes:

## 2018-05-23 NOTE — H&P (Signed)
History and Physical    [name]:[id number] DOB: [birth year] DOA: 05/23/2018  PCP: [name], MD  Patient coming from: home    I have personally briefly reviewed patient's old medical records available.   Chief Complaint: chest pain and back pain   HPI: Katie [name] is a 78 y.o. female with medical history significant of multiple medical problems including AAA, CHF with ejection fraction of 35%, status post AICD, chronic A. fib on Xarelto, COPD, CKD stage III/IV, hypertension hyperlipidemia who is presenting to the emergency room with intermittent chest pain and back pain since last 2 days.  Patient is poor historian.  She says that she had these occasional pain however they are more frequent since last 2 days.  She points her pain to the xiphisternum, worse with movement, same pain radiating to the middle back, moderate in intensity, relieved by nitroglycerin and morphine in the ER.  Patient noted poor appetite and some chills few days ago.  She denies any nausea, vomiting, cough cold or flulike symptoms.  No sick contacts.  She does have chronic swelling of the extremities.  She states she gained about 5 pound weight over last 1 month despite being on double dose of diuretics.  Denies any shortness of breath.  Does not use oxygen at home. ED Course: Hemodynamically stable.  Her oxygen measured 88-90% on room air.  She is on 2 L oxygen for comfort.  Potassium is 2.8.  Creatinine is 2.2, her creatinine has been fluctuating anywhere between 1.7-2.8. Patient was given nitroglycerin with no improvement, she received 2 doses of morphine and that is for pain.  On my examination her pain was not present. She does have slightly abnormal LFTs including total bilirubin of 2.3, AST 60/ALT 14.  Previous liver function tests were normal.  Review of Systems: As per HPI otherwise 10 point review of systems negative.    Past Medical History:  Diagnosis Date  . AAA (abdominal aortic aneurysm)  ([place])   . AICD (automatic cardioverter/defibrillator) present 11/10/2017  . Arthritis    "some in my knees" (11/10/2017)  . Atrial fibrillation ([place])   . CHF (congestive heart failure) ([place])   . Chronic bronchitis ([place])   . COPD (chronic obstructive pulmonary disease) ([place])    'they say I don't; have a problem breathing though" (11/10/2017)  . Depression   . GERD (gastroesophageal reflux disease)   . Gout    "daily RX" (11/10/2017)  . History of hiatal hernia   . Hyperlipidemia   . Hypertension   . Hypothyroid   . Migraine headache    "hx; none since 1980s" (11/10/2017)  . Osteopenia   . Pneumonia    "~ 3 times" (11/10/2017)  . PVD (peripheral vascular disease) ([place])     Past Surgical History:  Procedure Laterality Date  . ABDOMINAL AORTIC ENDOVASCULAR STENT GRAFT N/A 04/11/2013   Procedure: ABDOMINAL AORTIC ENDOVASCULAR STENT GRAFT WITH RIGHT FEMORAL PATCH ANGIOPLASTY;  Surgeon: Mal Misty, MD;  Location: [facility];  Service: Vascular;  Laterality: N/A;  . BIV ICD INSERTION CRT-D  11/10/2017  . BIV ICD INSERTION CRT-D N/A 11/10/2017   Procedure: BIV ICD INSERTION CRT-D;  Surgeon: Evans Lance, MD;  Location: [facility];  Service: Cardiovascular;  Laterality: N/A;  . BREAST SURGERY     LEFT BREAST BIOPSY  . CARDIAC CATHETERIZATION    . CATARACT EXTRACTION W/ INTRAOCULAR LENS  IMPLANT, BILATERAL Bilateral   . CHOLECYSTECTOMY OPEN  1972  .  COLONOSCOPY    . EYE SURGERY Bilateral    "bleeding in my eyes"  . FRACTURE SURGERY    . TIBIA FRACTURE SURGERY Left 1970s  . TONSILLECTOMY    . VARICOSE VEIN SURGERY Bilateral    "laser"     reports that she quit smoking about 15 years ago. Her smoking use included cigarettes. She has a 108.00 pack-year smoking history. She has never used smokeless tobacco. She reports current alcohol use. She reports that she does not use drugs.  Allergies  Allergen Reactions  . Amiodarone Other (See Comments)    PULMONARY TOXICITY  . Diovan  [Valsartan] Other (See Comments)    HYPOTENSION  . Doxycycline Diarrhea and Other (See Comments)    VISUAL DISTURBANCE  . Flexeril [Cyclobenzaprine] Other (See Comments)    FATIGUE  . Keflex [Cephalexin] Diarrhea  . Verapamil Other (See Comments)    EDEMA  . Codeine Hives    Family History  Problem Relation Age of Onset  . Cirrhosis Mother   . Cancer Mother 54       PANCREAS  . Heart defect Sister   . Breast cancer Sister        age 20  . Heart disease Sister   . Stroke Sister   . Alcohol abuse Father   . Depression Father   . Hypertension Brother   . Hyperlipidemia Son   . Heart disease Daughter      Prior to Admission medications   Medication Sig Start Date End Date Taking? Authorizing Provider  acetaminophen (TYLENOL) 500 MG tablet Take 1,000 mg by mouth every 6 (six) hours as needed for moderate pain.    Yes [provider]  albuterol (PROAIR HFA) 108 (90 Base) MCG/ACT inhaler 2 inhalations 10-15 minutes apart evert 4 hours as needed for Asthma Flare Patient taking differently: Inhale 2 puffs into the lungs every 4 (four) hours as needed for shortness of breath (asthma flare).  07/20/17  Yes [name], MD  allopurinol (ZYLOPRIM) 300 MG tablet TAKE 1 TABLET BY MOUTH EVERY DAY Patient taking differently: Take 300 mg by mouth daily.  04/13/18  Yes [name], MD  aspirin EC 81 MG tablet Take 81 mg by mouth daily.   Yes [provider]  baclofen (LIORESAL) 10 MG tablet Take 1 tablet (10 mg total) by mouth at bedtime as needed for muscle spasms. 05/19/18  Yes Hilts, Legrand Como, MD  cetirizine (ZYRTEC) 10 MG tablet Take 10 mg by mouth as needed for allergies.    Yes [provider]  Cholecalciferol (VITAMIN D3) 5000 units CAPS Take 5,000 Units by mouth daily.    Yes [provider]  digoxin (LANOXIN) 0.125 MG tablet Take 1 tablet (0.125 mg total) by mouth daily. 01/05/18  Yes McClanahan, Danton Sewer, NP  Eyelid Cleansers (AVENOVA) 0.01 % SOLN  Apply 1 application topically See admin instructions. Wash eyelids twice daily with cleaner 11/28/17  Yes [provider]  gabapentin (NEURONTIN) 300 MG capsule Take 300 mg by mouth 3 (three) times daily.    Yes [provider]  guaiFENesin (MUCINEX) 600 MG 12 hr tablet Take 1 tablet by mouth daily as needed (runny nose).    Yes [provider]  ipratropium (ATROVENT) 0.06 % nasal spray Use 1 to 2 sprays each Nostril 2 to 3 x / day as needed Patient taking differently: Place 1-2 sprays into both nostrils 3 (three) times daily as needed for rhinitis.  11/22/17 11/23/18 Yes [name], MD  isosorbide mononitrate (  IMDUR) 30 MG 24 hr tablet TAKE 1 TABLET DAILY FOR BP & HEART Patient taking differently: Take 30 mg by mouth daily.  04/12/18  Yes Liane Comber, NP  levothyroxine (SYNTHROID) 100 MCG tablet Take 1 tablet daily on an empty stomach with only water for 30 minutes. Patient taking differently: Take 100 mcg by mouth daily before breakfast.  02/03/18  Yes [name], MD  lidocaine (LIDODERM) 5 % Place 1 patch onto the skin daily as needed. Patient taking differently: Place 1 patch onto the skin daily as needed (pain).  05/19/18  Yes Hilts, Legrand Como, MD  metolazone (ZAROXOLYN) 2.5 MG tablet 1 pill 30 mins prior to fluid pill as needed/directed Patient taking differently: Take 2.5 mg by mouth See admin instructions. Take as directed by MD via phone call 05/11/18  Yes Vicie Mutters, PA-C  metoprolol succinate (TOPROL-XL) 50 MG 24 hr tablet Take 1 tablet (50 mg total) by mouth daily. Take with or immediately following a meal. 11/23/17 05/23/18 Yes Evans Lance, MD  montelukast (SINGULAIR) 10 MG tablet Take 1 tablet daily for Allergies & Asthma Patient taking differently: Take 10 mg by mouth at bedtime.  02/02/18  Yes [name], MD  pantoprazole (PROTONIX) 40 MG tablet Take 1 tablet (40 mg total) by mouth daily. 07/16/16  Yes Wilhelmine Krogstad, Henreitta Leber, MD  Polyethyl  Glycol-Propyl Glycol (SYSTANE OP) Place 1 drop into both eyes 4 (four) times daily as needed (dry eyes).    Yes [provider]  potassium chloride SA (KLOR-CON M20) 20 MEQ tablet TAKE 2 TABLETS (40 MEQ TOTAL) BY MOUTH DAILY. Patient taking differently: Take 40 mEq by mouth daily.  12/01/17  Yes Vicie Mutters, PA-C  pravastatin (PRAVACHOL) 40 MG tablet Take 40 mg by mouth daily.   Yes [provider]  prednisoLONE acetate (PRED FORTE) 1 % ophthalmic suspension Place 1 drop into both eyes 2 (two) times daily.  04/27/17  Yes [provider]  prochlorperazine (COMPAZINE) 5 MG tablet TAKE 1 TABLET BY MOUTH THREE TIMES A DAY AS NEEDED FOR VERTIGO OR NAUSEA Patient taking differently: Take 5 mg by mouth daily as needed for nausea (vertigo).  10/09/17  Yes [name], MD  rivaroxaban (XARELTO) 20 MG TABS tablet Take 1 tablet (20 mg total) by mouth daily with supper. 12/08/17  Yes Vicie Mutters, PA-C  torsemide (DEMADEX) 20 MG tablet TAKE 2 TABLETS (40 MG TOTAL) BY MOUTH 2 (TWO) TIMES DAILY. Patient taking differently: Take 40 mg by mouth 2 (two) times daily.  02/24/18  Yes Evans Lance, MD  triamcinolone cream (KENALOG) 0.1 % Apply 1 application topically daily.    Yes [provider]    Physical Exam: Vitals:   05/23/18 1003 05/23/18 1030 05/23/18 1045 05/23/18 1100  BP: (!) 113/48 123/69 (!) 110/57 116/74  Pulse: 83 77 71 73  Resp: 16 15 14 15   Temp:      TempSrc:      SpO2: 94% 100% 99% 99%    Constitutional: NAD, calm, comfortable Vitals:   05/23/18 1003 05/23/18 1030 05/23/18 1045 05/23/18 1100  BP: (!) 113/48 123/69 (!) 110/57 116/74  Pulse: 83 77 71 73  Resp: 16 15 14 15   Temp:      TempSrc:      SpO2: 94% 100% 99% 99%   Eyes: PERRL, lids and conjunctivae normal Chronically sick looking.  Not in any distress.  Comfortable on 1 L oxygen. ENMT: Mucous membranes are moist. Posterior pharynx clear of any exudate or  lesions.Normal dentition.   Neck: normal, supple, no masses, no thyromegaly Respiratory: clear to auscultation bilaterally, no wheezing, no crackles. Normal respiratory effort. No accessory muscle use.  She has left precordial AICD in place, nontender. Cardiovascular: Regular rate and rhythm, no murmurs / rubs / gallops.  Trace bilateral pedal edema.  2+ pedal pulses. No carotid bruits.  She does have palpable tenderness along the xiphisternum. Abdomen: no tenderness, no masses palpated. No hepatosplenomegaly. Bowel sounds positive.  Musculoskeletal: no clubbing / cyanosis. No joint deformity upper and lower extremities. Good ROM, no contractures. Normal muscle tone.  Skin: no rashes, lesions, ulcers. No induration Neurologic: CN 2-12 grossly intact. Sensation intact, DTR normal. Strength 5/5 in all 4.  Psychiatric: Normal judgment and insight. Alert and oriented x 3. Normal mood.     Labs on Admission: I have personally reviewed following labs and imaging studies  CBC: Recent Labs  Lab 05/23/18 0203  WBC 8.6  HGB 15.9*  HCT 48.8*  MCV 92.4  PLT 160   Basic Metabolic Panel: Recent Labs  Lab 05/23/18 0203  NA 133*  K 2.8*  CL 88*  CO2 28  GLUCOSE 131*  BUN 58*  CREATININE 2.20*  CALCIUM 10.3   GFR: Estimated Creatinine Clearance: 24 mL/min (A) (by C-G formula based on SCr of 2.2 mg/dL (H)). Liver Function Tests: Recent Labs  Lab 05/23/18 0203  AST 60*  ALT 14  ALKPHOS 33*  BILITOT 2.3*  PROT 6.4*  ALBUMIN 3.8   Recent Labs  Lab 05/23/18 0203  LIPASE 33   No results for input(s): AMMONIA in the last 168 hours. Coagulation Profile: No results for input(s): INR, PROTIME in the last 168 hours. Cardiac Enzymes: No results for input(s): CKTOTAL, CKMB, CKMBINDEX, TROPONINI in the last 168 hours. BNP (last 3 results) Recent Labs    06/23/17 1231  PROBNP 208.0*   HbA1C: No results for input(s): HGBA1C in the last 72 hours. CBG: No results for input(s): GLUCAP in the last 168  hours. Lipid Profile: No results for input(s): CHOL, HDL, LDLCALC, TRIG, CHOLHDL, LDLDIRECT in the last 72 hours. Thyroid Function Tests: No results for input(s): TSH, T4TOTAL, FREET4, T3FREE, THYROIDAB in the last 72 hours. Anemia Panel: No results for input(s): VITAMINB12, FOLATE, FERRITIN, TIBC, IRON, RETICCTPCT in the last 72 hours. Urine analysis:    Component Value Date/Time   COLORURINE YELLOW 05/04/2018 1127   APPEARANCEUR CLOUDY (A) 05/04/2018 1127   LABSPEC 1.010 05/04/2018 1127   PHURINE 6.5 05/04/2018 1127   GLUCOSEU NEGATIVE 05/04/2018 1127   HGBUR NEGATIVE 05/04/2018 1127   BILIRUBINUR NEGATIVE 09/21/2017 2155   [place] 05/04/2018 1127   PROTEINUR NEGATIVE 05/04/2018 1127   UROBILINOGEN 0.2 05/08/2014 1232   NITRITE NEGATIVE 05/04/2018 1127   LEUKOCYTESUR 1+ (A) 05/04/2018 1127    Radiological Exams on Admission: Dg Chest 2 View  Result Date: 05/23/2018 CLINICAL DATA:  Mid chest pain tonight. EXAM: CHEST - 2 VIEW COMPARISON:  11/11/2017 FINDINGS: Cardiac pacemaker. Diffuse cardiac enlargement. No vascular congestion, edema, or consolidation. No blunting of costophrenic angles. No pneumothorax. Mediastinal contours appear intact. Calcification of the aorta. IMPRESSION: Cardiac enlargement. No evidence of active pulmonary disease. Electronically Signed   By: Lucienne Capers M.D.   On: 05/23/2018 02:25    EKG: Independently reviewed.  Shows A. fib with RVR.  At some is comparable to previous EKGs.  Assessment/Plan Principal Problem:   Chest pain Active Problems:   Essential hypertension   Atherosclerosis of native coronary artery of native  heart without angina pectoris   Atrial fibrillation (HCC)   Chronic combined systolic and diastolic CHF (congestive heart failure) (HCC)   Pulmonary emphysema (HCC)   Gastroesophageal reflux disease without esophagitis   Hypothyroidism   CKD (chronic kidney disease) stage 3, GFR 30-59 ml/min ([facility])   Hyperlipidemia,  mixed   LFTs abnormal     1.  Chest pain: Probably atypical. We will admit patient to the telemetry unit given severity of symptoms. Currently chest pain improved with morphine. Cycle EKG and troponins to rule out ACS. Will check echocardiogram to assess her ejection fraction and volume status as well to look for any wall motion abnormalities. Supplemental oxygen to keep saturations more than 90%. Aspirin given in the ER.  She is on Xarelto.  Will hold further aspirin. Nitroglycerin and Morphine for severe and recurrent pain.  2.  Abnormal LFTs: Patient has very minimal abnormality on her LFTs.  May indicate some types of transient liver injury.  Will decrease a dose of Tylenol since taking.  She is status post cholecystectomy.  Lipase was normal.  No obstructive pattern.  May not need further investigations.  We will continue statin. Will repeat LFTs in the morning to ensure stabilization.  3.  Chronic atrial fibrillation: Rate controlled.  Therapeutic on Xarelto that she will continue.  4.  Chronic combined systolic and diastolic heart failure: Status post AICD.  Patient complained of 5 pound weight gain.  We will continue her increased dose of torsemide.  She is more or less euvolemic on clinical examination.  We will hold off on further diuretics given underlying poor renal function.  Will check echocardiogram.  5.  Hypokalemia: Replace aggressively and monitor levels.  6.  CKD stage III/4: Her renal functions more or less at baseline.  Will monitor closely.  7.  COPD: Fairly stable.  No exacerbation.  Bronchodilator as needed.    DVT prophylaxis: Therapeutic on Xarelto Code Status: Full code Family Communication: No family at bedside Disposition Plan: Home anticipated tomorrow Consults called: None Admission status: Observation   Barb Merino MD [facility] Pager [phone]  If 7PM-7AM, please contact night-coverage [email or web address] Password [email or web address]  05/23/2018,  11:25 AM

## 2018-05-23 NOTE — ED Notes (Signed)
Anna(SR) Lunch Tray Ordered@ 1203-per Sheyla, RN-called by Levada Dy

## 2018-05-23 NOTE — ED Notes (Signed)
ED Provider at bedside. 

## 2018-05-24 ENCOUNTER — Ambulatory Visit: Payer: Self-pay | Admitting: Adult Health

## 2018-05-24 ENCOUNTER — Telehealth: Payer: Self-pay | Admitting: Physician Assistant

## 2018-05-24 ENCOUNTER — Other Ambulatory Visit: Payer: Self-pay

## 2018-05-24 DIAGNOSIS — J439 Emphysema, unspecified: Secondary | ICD-10-CM

## 2018-05-24 DIAGNOSIS — N183 Chronic kidney disease, stage 3 (moderate): Secondary | ICD-10-CM

## 2018-05-24 DIAGNOSIS — E876 Hypokalemia: Secondary | ICD-10-CM | POA: Diagnosis not present

## 2018-05-24 DIAGNOSIS — N179 Acute kidney failure, unspecified: Secondary | ICD-10-CM | POA: Diagnosis not present

## 2018-05-24 DIAGNOSIS — J449 Chronic obstructive pulmonary disease, unspecified: Secondary | ICD-10-CM | POA: Diagnosis not present

## 2018-05-24 DIAGNOSIS — R0902 Hypoxemia: Secondary | ICD-10-CM

## 2018-05-24 DIAGNOSIS — I2510 Atherosclerotic heart disease of native coronary artery without angina pectoris: Secondary | ICD-10-CM | POA: Diagnosis not present

## 2018-05-24 DIAGNOSIS — I4821 Permanent atrial fibrillation: Secondary | ICD-10-CM | POA: Diagnosis not present

## 2018-05-24 DIAGNOSIS — E039 Hypothyroidism, unspecified: Secondary | ICD-10-CM

## 2018-05-24 DIAGNOSIS — I5042 Chronic combined systolic (congestive) and diastolic (congestive) heart failure: Secondary | ICD-10-CM | POA: Diagnosis not present

## 2018-05-24 DIAGNOSIS — R0789 Other chest pain: Secondary | ICD-10-CM | POA: Diagnosis not present

## 2018-05-24 DIAGNOSIS — R945 Abnormal results of liver function studies: Secondary | ICD-10-CM

## 2018-05-24 LAB — COMPREHENSIVE METABOLIC PANEL
ALBUMIN: 3.6 g/dL (ref 3.5–5.0)
ALK PHOS: 35 U/L — AB (ref 38–126)
ALT: 15 U/L (ref 0–44)
AST: 21 U/L (ref 15–41)
Anion gap: 12 (ref 5–15)
BILIRUBIN TOTAL: 0.9 mg/dL (ref 0.3–1.2)
BUN: 54 mg/dL — ABNORMAL HIGH (ref 8–23)
CO2: 30 mmol/L (ref 22–32)
Calcium: 9.9 mg/dL (ref 8.9–10.3)
Chloride: 92 mmol/L — ABNORMAL LOW (ref 98–111)
Creatinine, Ser: 1.92 mg/dL — ABNORMAL HIGH (ref 0.44–1.00)
GFR calc Af Amer: 29 mL/min — ABNORMAL LOW (ref >60)
GFR calc non Af Amer: 25 mL/min — ABNORMAL LOW (ref >60)
Glucose, Bld: 113 mg/dL — ABNORMAL HIGH (ref 70–99)
Potassium: 3.4 mmol/L — ABNORMAL LOW (ref 3.5–5.1)
Sodium: 134 mmol/L — ABNORMAL LOW (ref 135–145)
Total Protein: 6 g/dL — ABNORMAL LOW (ref 6.5–8.1)

## 2018-05-24 LAB — TROPONIN I: Troponin I: 0.05 ng/mL (ref <0.03)

## 2018-05-24 MED ORDER — TORSEMIDE 20 MG PO TABS: 20.0000 mg | ORAL_TABLET | Freq: Two times a day (BID) | ORAL | Status: DC

## 2018-05-24 MED ORDER — METOPROLOL SUCCINATE ER 50 MG PO TB24
75.0000 mg | ORAL_TABLET | Freq: Every day | ORAL | Status: DC
  Administered 2018-05-24: 75 mg via ORAL
  Filled 2018-05-24: qty 1

## 2018-05-24 MED ORDER — TORSEMIDE 20 MG PO TABS: 40.0000 mg | ORAL_TABLET | Freq: Two times a day (BID) | ORAL | Status: DC

## 2018-05-24 MED ORDER — METOPROLOL SUCCINATE ER 25 MG PO TB24: 75.0000 mg | ORAL_TABLET | Freq: Every day | ORAL | 0 refills | Status: DC

## 2018-05-24 NOTE — Progress Notes (Signed)
SATURATION QUALIFICATIONS: (This note is used to comply with regulatory documentation for home oxygen)  Patient Saturations on Room Air at Rest = 88%  Patient Saturations on Room Air while Ambulating = 82%  Patient Saturations on 3 Liters of oxygen while Ambulating = 91%  Please briefly explain why patient needs home oxygen: Pt desaturates on room air.  During ambulation, pt got dizzy on room air and felt like she was going to give out.  Put on 2 L, did not see significant improvement.  With 3 L Pt O2 increased to 98% sitting and ambulating for 2 minutes dropped to 91%.

## 2018-05-24 NOTE — Consult Note (Addendum)
Cardiology Consultation:   Patient ID: Katie [name] MRN: [id number]; DOB: 08/15/1940  Admit date: 05/23/2018 Date of Consult: 05/24/2018  Primary Care Provider: [name], MD Primary Cardiologist: No primary care provider on file.  Primary Electrophysiologist:  Cristopher Peru, MD    Patient Profile:   Katie [name] is a 78 y.o. female with a hx of atrial fibrillation, chronic systolic CHF, LBBB, BiV ICD, HTN, HLD, AAA, COPD, CKD stage III/IV who is being seen today for the evaluation of chest pain at the request of Dr. Tamala Julian.  History of Present Illness:   [name] woke up from sleep at about 2:30 AM yesterday with central chest pressure.  She had no associated shortness of breath.  She has had this type of discomfort in the past but not to this degree.  She rated her discomfort at a 7/10.  She decided to come to the ER and remained in the waiting room for most of the day.  Her discomfort was intermittent lasting for a few seconds at a time.  She says that her pain finally resolved after being given nitroglycerin in the ED last evening.  She had some vomiting but only after being given morphine which she says usually causes her to be nauseous.  She has had no further chest pain through the night and she is chest pain-free this morning.  The patient denies orthopnea, PND, palpitations.  She has mild lightheadedness this morning but none yesterday with her pain.  She has trace lower leg edema left greater than right.  She says her left leg has always been swollen since a crushing injury in the 56s.   Blood pressure was mildly elevated on presentation 154/92.  Blood pressure is been stable and well controlled since that time.  Past Medical History:  Diagnosis Date  . AAA (abdominal aortic aneurysm) ([place])   . AICD (automatic cardioverter/defibrillator) present 11/10/2017  . Arthritis    "some in my knees" (11/10/2017)  . Atrial fibrillation ([place])   . CHF (congestive heart failure)  ([place])   . Chronic bronchitis ([place])   . COPD (chronic obstructive pulmonary disease) ([place])    'they say I don't; have a problem breathing though" (11/10/2017)  . Depression   . GERD (gastroesophageal reflux disease)   . Gout    "daily RX" (11/10/2017)  . History of hiatal hernia   . Hyperlipidemia   . Hypertension   . Hypothyroid   . Migraine headache    "hx; none since 1980s" (11/10/2017)  . Osteopenia   . Pneumonia    "~ 3 times" (11/10/2017)  . PVD (peripheral vascular disease) ([place])     Past Surgical History:  Procedure Laterality Date  . ABDOMINAL AORTIC ENDOVASCULAR STENT GRAFT N/A 04/11/2013   Procedure: ABDOMINAL AORTIC ENDOVASCULAR STENT GRAFT WITH RIGHT FEMORAL PATCH ANGIOPLASTY;  Surgeon: Mal Misty, MD;  Location: [facility];  Service: Vascular;  Laterality: N/A;  . BIV ICD INSERTION CRT-D  11/10/2017  . BIV ICD INSERTION CRT-D N/A 11/10/2017   Procedure: BIV ICD INSERTION CRT-D;  Surgeon: Evans Lance, MD;  Location: [facility];  Service: Cardiovascular;  Laterality: N/A;  . BREAST SURGERY     LEFT BREAST BIOPSY  . CARDIAC CATHETERIZATION    . CATARACT EXTRACTION W/ INTRAOCULAR LENS  IMPLANT, BILATERAL Bilateral   . CHOLECYSTECTOMY OPEN  1972  . COLONOSCOPY    . EYE SURGERY Bilateral    "bleeding in my eyes"  . FRACTURE SURGERY    .  TIBIA FRACTURE SURGERY Left 1970s  . TONSILLECTOMY    . VARICOSE VEIN SURGERY Bilateral    "laser"     Home Medications:  Prior to Admission medications   Medication Sig Start Date End Date Taking? Authorizing Provider  acetaminophen (TYLENOL) 500 MG tablet Take 1,000 mg by mouth every 6 (six) hours as needed for moderate pain.    Yes [provider]  albuterol (PROAIR HFA) 108 (90 Base) MCG/ACT inhaler 2 inhalations 10-15 minutes apart evert 4 hours as needed for Asthma Flare Patient taking differently: Inhale 2 puffs into the lungs every 4 (four) hours as needed for shortness of breath (asthma flare).  07/20/17  Yes  [name], MD  allopurinol (ZYLOPRIM) 300 MG tablet TAKE 1 TABLET BY MOUTH EVERY DAY Patient taking differently: Take 300 mg by mouth daily.  04/13/18  Yes [name], MD  aspirin EC 81 MG tablet Take 81 mg by mouth daily.   Yes [provider]  baclofen (LIORESAL) 10 MG tablet Take 1 tablet (10 mg total) by mouth at bedtime as needed for muscle spasms. 05/19/18  Yes Hilts, Legrand Como, MD  cetirizine (ZYRTEC) 10 MG tablet Take 10 mg by mouth as needed for allergies.    Yes [provider]  Cholecalciferol (VITAMIN D3) 5000 units CAPS Take 5,000 Units by mouth daily.    Yes [provider]  digoxin (LANOXIN) 0.125 MG tablet Take 1 tablet (0.125 mg total) by mouth daily. 01/05/18  Yes McClanahan, Danton Sewer, NP  Eyelid Cleansers (AVENOVA) 0.01 % SOLN Apply 1 application topically See admin instructions. Wash eyelids twice daily with cleaner 11/28/17  Yes [provider]  gabapentin (NEURONTIN) 300 MG capsule Take 300 mg by mouth 3 (three) times daily.    Yes [provider]  guaiFENesin (MUCINEX) 600 MG 12 hr tablet Take 1 tablet by mouth daily as needed (runny nose).    Yes [provider]  ipratropium (ATROVENT) 0.06 % nasal spray Use 1 to 2 sprays each Nostril 2 to 3 x / day as needed Patient taking differently: Place 1-2 sprays into both nostrils 3 (three) times daily as needed for rhinitis.  11/22/17 11/23/18 Yes [name], MD  isosorbide mononitrate (IMDUR) 30 MG 24 hr tablet TAKE 1 TABLET DAILY FOR BP & HEART Patient taking differently: Take 30 mg by mouth daily.  04/12/18  Yes Liane Comber, NP  levothyroxine (SYNTHROID) 100 MCG tablet Take 1 tablet daily on an empty stomach with only water for 30 minutes. Patient taking differently: Take 100 mcg by mouth daily before breakfast.  02/03/18  Yes [name], MD  lidocaine (LIDODERM) 5 % Place 1 patch onto the skin daily as needed. Patient taking differently: Place 1 patch onto the  skin daily as needed (pain).  05/19/18  Yes Hilts, Legrand Como, MD  metolazone (ZAROXOLYN) 2.5 MG tablet 1 pill 30 mins prior to fluid pill as needed/directed Patient taking differently: Take 2.5 mg by mouth See admin instructions. Take as directed by MD via phone call 05/11/18  Yes Vicie Mutters, PA-C  metoprolol succinate (TOPROL-XL) 50 MG 24 hr tablet Take 1 tablet (50 mg total) by mouth daily. Take with or immediately following a meal. 11/23/17 05/23/18 Yes Evans Lance, MD  montelukast (SINGULAIR) 10 MG tablet Take 1 tablet daily for Allergies & Asthma Patient taking differently: Take 10 mg by mouth at bedtime.  02/02/18  Yes [name], MD  pantoprazole (PROTONIX) 40 MG tablet Take 1 tablet (40 mg total) by mouth  daily. 07/16/16  Yes Ghimire, Henreitta Leber, MD  Polyethyl Glycol-Propyl Glycol (SYSTANE OP) Place 1 drop into both eyes 4 (four) times daily as needed (dry eyes).    Yes [provider]  potassium chloride SA (KLOR-CON M20) 20 MEQ tablet TAKE 2 TABLETS (40 MEQ TOTAL) BY MOUTH DAILY. Patient taking differently: Take 40 mEq by mouth daily.  12/01/17  Yes Vicie Mutters, PA-C  pravastatin (PRAVACHOL) 40 MG tablet Take 40 mg by mouth daily.   Yes [provider]  prednisoLONE acetate (PRED FORTE) 1 % ophthalmic suspension Place 1 drop into both eyes 2 (two) times daily.  04/27/17  Yes [provider]  prochlorperazine (COMPAZINE) 5 MG tablet TAKE 1 TABLET BY MOUTH THREE TIMES A DAY AS NEEDED FOR VERTIGO OR NAUSEA Patient taking differently: Take 5 mg by mouth daily as needed for nausea (vertigo).  10/09/17  Yes [name], MD  rivaroxaban (XARELTO) 20 MG TABS tablet Take 1 tablet (20 mg total) by mouth daily with supper. 12/08/17  Yes Vicie Mutters, PA-C  torsemide (DEMADEX) 20 MG tablet TAKE 2 TABLETS (40 MG TOTAL) BY MOUTH 2 (TWO) TIMES DAILY. Patient taking differently: Take 40 mg by mouth 2 (two) times daily.  02/24/18  Yes Evans Lance, MD   triamcinolone cream (KENALOG) 0.1 % Apply 1 application topically daily.    Yes [provider]    Inpatient Medications: Scheduled Meds: . allopurinol  300 mg Oral Daily  . aspirin EC  81 mg Oral Daily  . cholecalciferol  5,000 Units Oral Daily  . digoxin  0.125 mg Oral Daily  . gabapentin  300 mg Oral TID  . isosorbide mononitrate  30 mg Oral Daily  . levothyroxine  100 mcg Oral QAC breakfast  . metoprolol succinate  50 mg Oral Daily  . montelukast  10 mg Oral QHS  . pantoprazole  40 mg Oral Daily  . potassium chloride SA  40 mEq Oral BID  . pravastatin  40 mg Oral Daily  . prednisoLONE acetate  1 drop Both Eyes BID  . rivaroxaban  15 mg Oral Q supper  . sodium chloride flush  3 mL Intravenous Once  . torsemide  40 mg Oral BID   Continuous Infusions:  PRN Meds: acetaminophen, albuterol, baclofen, lidocaine, prochlorperazine, promethazine  Allergies:    Allergies  Allergen Reactions  . Amiodarone Other (See Comments)    PULMONARY TOXICITY  . Diovan [Valsartan] Other (See Comments)    HYPOTENSION  . Doxycycline Diarrhea and Other (See Comments)    VISUAL DISTURBANCE  . Flexeril [Cyclobenzaprine] Other (See Comments)    FATIGUE  . Keflex [Cephalexin] Diarrhea  . Verapamil Other (See Comments)    EDEMA  . Codeine Hives    Social History:   Social History   Socioeconomic History  . Marital status: Married    Spouse name: Not on file  . Number of children: Not on file  . Years of education: Not on file  . Highest education level: Not on file  Occupational History  . Not on file  Social Needs  . Financial resource strain: Somewhat hard  . Food insecurity:    Worry: Never true    Inability: Never true  . Transportation needs:    Medical: No    Non-medical: No  Tobacco Use  . Smoking status: Former Smoker    Packs/day: 2.00    Years: 54.00    Pack years: 108.00    Types: Cigarettes    Last  attempt to quit: 08/10/2002    Years since quitting:  15.7  . Smokeless tobacco: Never Used  Substance and Sexual Activity  . Alcohol use: Yes    Comment: 11/10/2017 "glass of wine q 3-4 months"  . Drug use: Never  . Sexual activity: Yes    Birth control/protection: Post-menopausal  Lifestyle  . Physical activity:    Days per week: 3 days    Minutes per session: 40 min  . Stress: Not at all  Relationships  . Social connections:    Talks on phone: More than three times a week    Gets together: Twice a week    Attends religious service: More than 4 times per year    Active member of club or organization: Yes    Attends meetings of clubs or organizations: Never    Relationship status: Married  . Intimate partner violence:    Fear of current or ex partner: No    Emotionally abused: No    Physically abused: No    Forced sexual activity: No  Other Topics Concern  . Not on file  Social History Narrative   Lives in [place] with husband   One daughter    Family History:    Family History  Problem Relation Age of Onset  . Cirrhosis Mother   . Cancer Mother 68       PANCREAS  . Heart defect Sister   . Breast cancer Sister        age 26  . Heart disease Sister   . Stroke Sister   . Alcohol abuse Father   . Depression Father   . Hypertension Brother   . Hyperlipidemia Son   . Heart disease Daughter      ROS:  Please see the history of present illness.   All other ROS reviewed and negative.     Physical Exam/Data:   Vitals:   05/23/18 1827 05/24/18 0139 05/24/18 0500 05/24/18 0609  BP: (!) 105/59 (!) 141/75  123/61  Pulse: 68 70  71  Resp: 15 18  18   Temp: 98.7 F (37.1 C) 98.1 F (36.7 C)  98.1 F (36.7 C)  TempSrc: Oral Oral  Oral  SpO2: 97% 95%  95%  Weight:   91.4 kg     Intake/Output Summary (Last 24 hours) at 05/24/2018 0821 Last data filed at 05/23/2018 1005 Gross per 24 hour  Intake 100 ml  Output -  Net 100 ml   Last 3 Weights 05/24/2018 05/16/2018 05/12/2018  Weight (lbs) 201 lb 8 oz 204 lb 205 lb  9.3 oz  Weight (kg) 91.4 kg 92.534 kg 93.25 kg     Body mass index is 34.59 kg/m.  General:  Well nourished, well developed, in no acute distress HEENT: normal Lymph: no adenopathy Neck: no JVD Endocrine:  No thryomegaly Vascular: No carotid bruits; FA pulses 2+ bilaterally without bruits  Cardiac:  normal S1, S2; irregularly irregular rhythm, 2/6 systolic murmur at the RUSB Lungs:  clear to auscultation bilaterally, no wheezing, rhonchi or rales  Abd: soft, nontender, no hepatomegaly  Ext: Trace pretibial edema, L>R Musculoskeletal:  No deformities, BUE and BLE strength normal and equal Skin: warm and dry  Neuro:  CNs 2-12 intact, no focal abnormalities noted Psych:  Normal affect   EKG:  The EKG was personally reviewed and demonstrates: Atrial fibrillation with intermittent ventricular pacing, 106 bpm, LBBB Telemetry:  Telemetry was personally reviewed and demonstrates: Atrial fibrillation in the 70s with frequent Bi V  pacing  Relevant CV Studies:  Echocardiogram 05/23/2018 IMPRESSIONS  1. The left ventricle has normal systolic function, with an ejection fraction of 55-60%. The cavity size was normal. There is mildly increased left ventricular wall thickness. Left ventricular diastolic function could not be evaluated secondary to  atrial fibrillation.  2. The right ventricle has normal systolic function. The cavity was normal. Right ventricular systolic pressure is mildly elevated with an estimated pressure of 35.7 mmHg.  3. Left atrial size was mildly dilated.  4. The mitral valve is grossly normal.  5. The aortic valve is tricuspid Moderate calcification of the aortic valve. Aortic valve regurgitation is mild by color flow Doppler. mild stenosis of the aortic valve.  6. Normal LV systolic function; mild LVH; mild biatrial enlargement; calcified aortic valve with mild AS (mean gradient 10 mmHg) and mild AI; mild TR with mildly elevated pulmonary pressure.  FINDINGS  Left  Ventricle: The left ventricle has normal systolic function, with an ejection fraction of 55-60%. The cavity size was normal. There is mildly increased left ventricular wall thickness. Left ventricular diastolic function could not be evaluated  secondary to atrial fibrillation. Right Ventricle: The right ventricle has normal systolic function. The cavity was normal. Right ventricular systolic pressure is mildly elevated with an estimated pressure of 35.7 mmHg. Pacing wire/catheter visualized in the right ventricle. Left Atrium: left atrial size was mildly dilated Right Atrium: right atrial size was mildly dilated. Right atrial pressure is estimated at 3 mmHg   Pericardium: There is no evidence of pericardial effusion. Mitral Valve: The mitral valve is grossly normal. Mitral valve regurgitation is trivial by color flow Doppler. Tricuspid Valve: The tricuspid valve is not well visualized. Tricuspid valve regurgitation is mild by color flow Doppler. Aortic Valve: The aortic valve is tricuspid Moderate calcification of the aortic valve. Aortic valve regurgitation is mild by color flow Doppler. There is mild stenosis of the aortic valve, with a calculated valve area of 1.90 cm. Pulmonic Valve: The pulmonic valve was not well visualized. Pulmonic valve regurgitation is not visualized by color flow Doppler. Venous: The inferior vena cava is normal in size with greater than 50% respiratory variability.  Echocardiogram 10/31/2017 Study Conclusions - Left ventricle: The cavity size was mildly dilated. There was   moderate focal basal and mild concentric hypertrophy. Systolic   function was moderately reduced. The estimated ejection fraction   was in the range of 35% to 40%. Severe diffuse hypokinesis with   regional variations. The study was not technically sufficient to   allow evaluation of LV diastolic dysfunction due to atrial   fibrillation. - Ventricular septum: Septal motion showed abnormal  function. These   changes are consistent with intraventricular conduction delay. - Aortic valve: Moderately calcified annulus. Trileaflet; mildly   thickened, mildly calcified leaflets. There was mild stenosis.   There was mild regurgitation. Mean gradient (S): 9 mm Hg. Valve   area (VTI): 1.19 cm^2. Valve area (Vmax): 1.36 cm^2. Valve area   (Vmean): 1.25 cm^2. - Aorta: Aortic root dimension: 39 mm (ED). - Aortic root: The aortic root was mildly dilated. - Mitral valve: Calcified annulus. There was mild regurgitation. - Left atrium: The atrium was mildly dilated. - Right atrium: The atrium was moderately to severely dilated. - Tricuspid valve: There was mild regurgitation. - Pulmonic valve: There was trivial regurgitation. - Pulmonary arteries: PA peak pressure: 37 mm Hg (S). - Impressions: The findings indicate significant septal-lateral   left ventricular wall dyssynchrony.  Impressions: - Compared  to prior echo LV function appears worse but may be due   to significant septal lateral dyssynchrony from LBBB. The   findings indicate significant septal-lateral left ventricular   wall dyssynchrony. The right ventricular systolic pressure was   increased consistent with mild pulmonary hypertension.  Leane Call 10/20/2017 Study Highlights    Nuclear stress EF: 34%. There is anteroseptal wall akinesis with otherwise general hypokinesis  There was no ST segment deviation noted during stress.  Defect 1: There is a medium defect of mild severity present in the mid anteroseptal location. This defect may be accentuated from interventricular conduction delay, left bundle branch block like.  This is an intermediate risk study based mainly upon reduced ejection fraction.     CARDIAC CATHETERIZATION 07/28/2010  RESULTS:   HEMODYNAMIC DATA:  Left ventricular pressure 140/24.  Aortic pressure  140/80.  There is no aortic valve gradient.   VENTRICULOGRAPHIC DATA:  Left  Ventriculogram:  There is global hypokinesis  of the left ventricle.  Ejection fraction is estimated at 30-35%.  There was  2+ mild-to-moderate mitral regurgitation.   ARTERIOGRAPHIC DATA:  Coronary Arteriography (Right Dominant)  Left Main:  Left main is normal.   Left Anterior Descending Artery:  The left anterior descending artery has a  diffuse 20% stenosis in the midvessel.  The LAD gives rise to three small  diagonal branches.   Left Circumflex:  The left circumflex gives rise to a large bifurcating  first obtuse marginal and small second obtuse marginal branch.  The first  obtuse marginal branch has a 50% stenosis proximally.  After its bifurcation  there is a 40% stenosis in the medial limb and a 30% stenosis in the lateral  limb.   Right Coronary Artery:  The right coronary artery is a dominant vessel.  There is a diffuse 50% stenosis throughout the length of the mid right  coronary artery.  The distal right coronary artery gives rise to a normal-  sized posterior descending artery and three small posterolateral branches.   IMPRESSION:  1. Moderately decreased left ventricular systolic function with global     hypokinesis consistent with a nonischemic cardiomyopathy.  2. Mild-to-moderate mitral regurgitation.  3. Moderate, but nonobstructive coronary artery disease.   PLAN:  The patient will be managed medically for her coronary artery  disease.  She will be started on Coumadin therapy in anticipation of a  direct current cardioversion for atrial fibrillation.    Laboratory Data:  Chemistry Recent Labs  Lab 05/23/18 0203 05/24/18 0142  NA 133* 134*  K 2.8* 3.4*  CL 88* 92*  CO2 28 30  GLUCOSE 131* 113*  BUN 58* 54*  CREATININE 2.20* 1.92*  CALCIUM 10.3 9.9  GFRNONAA 21* 25*  GFRAA 24* 29*  ANIONGAP 17* 12    Recent Labs  Lab 05/23/18 0203 05/24/18 0142  PROT 6.4* 6.0*  ALBUMIN 3.8 3.6  AST 60* 21  ALT 14 15  ALKPHOS 33* 35*  BILITOT 2.3*  0.9   Hematology Recent Labs  Lab 05/23/18 0203  WBC 8.6  RBC 5.28*  HGB 15.9*  HCT 48.8*  MCV 92.4  MCH 30.1  MCHC 32.6  RDW 15.2  PLT 213   Cardiac Enzymes Recent Labs  Lab 05/23/18 1141 05/23/18 1946 05/24/18 0142  TROPONINI 0.05* 0.05* 0.05*    Recent Labs  Lab 05/23/18 0218 05/23/18 0731  TROPIPOC 0.06 0.05    BNP Recent Labs  Lab 05/23/18 0203  BNP 63.4    DDimer No  results for input(s): DDIMER in the last 168 hours.  Radiology/Studies:  Dg Chest 2 View  Result Date: 05/23/2018 CLINICAL DATA:  Mid chest pain tonight. EXAM: CHEST - 2 VIEW COMPARISON:  11/11/2017 FINDINGS: Cardiac pacemaker. Diffuse cardiac enlargement. No vascular congestion, edema, or consolidation. No blunting of costophrenic angles. No pneumothorax. Mediastinal contours appear intact. Calcification of the aorta. IMPRESSION: Cardiac enlargement. No evidence of active pulmonary disease. Electronically Signed   By: Lucienne Capers M.D.   On: 05/23/2018 02:25    Assessment and Plan:   Chest pain -Pt with over 12 hours of intermittent chest pressure with no associated symptoms. Resolved with NTG in the ED last evening. No further chest pain since.  -Troponins are mildly elevated and flat pattern, 0.05.  In looking back it appears that the patient has a chronically mildly elevated troponin.  -EKG non-diagnostic due to pacing and LBBB -CXR shows cardiac enlargement.  No evidence of active pulmonary disease. -Her chest pain is atypical and has resolved. Her echo shows normalized LV function. No further testing in the hospital. Plan for follow up in the office and can re-evaluate if further chest pain.  -Will increase metoprolol to try to get her to pace more for BiV resynchronization.    Chronic systolic CHF -Medical therapy includes Toprol-XL 50 mg daily, torsemide 40 mg twice daily, metolazone 2.5 mg as needed.  Not on ACEI/ARB presumably due to renal function. -EF was 35-40% by echo  10/31/2017.  Along with LBBB the patient has a biventricular ICD for resynchronization therapy. -Echo done yesterday shows improvement in LVEF of 55-60%, cavity size was normal, no regional wall motion abnormalities. -Patient denies orthopnea or PND.  She has mild chronic lower leg edema L>R (left leg edema has been chronic since a crushing injury in the 1970s)  Permanent atrial fibrillation -Rate controlled with the blocker and digoxin. -Currently appears to be in A. fib with intermittent BiV pacing, rates in the 70s. -Patient is on Xarelto for stroke risk reduction  CKD stage III/IV -Baseline SCr appears to be ~1.4. SCr was 2.37 on 05/16/2018. SCr 2.2 on admission, 1.92 today. -Also has hypokalemia -Pt took prn metolazone on Friday for increased swelling which may have cause worsened renal function.   Hypertension -Blood pressure was initially high on presentation to the ED but has been well controlled since.  Hyperlipidemia -On pravastatin 40 mg daily -LDL was 96 on 05/16/2018, well controlled.   ICD -[employer] BiV ICD placed 11/10/2017 for worsening LV dysfunction and LBBB.   Hx of abdominal aortic endovascular stent graft in 2015 -Patient continues on aspirin 81 mg  [facility] will sign off.   Medication Recommendations:  Increase Toprol XL to 75 mg daily Other recommendations (labs, testing, etc):  Follow up renal function  Follow up as an outpatient:  With Dr. Lovena Le, I will arrange.    For questions or updates, please contact [facility] Please consult [email or web address] for contact info under     Signed, Daune Perch, NP  05/24/2018 8:21 AM Patient seen and examined and history reviewed. Agree with above findings and plan. Pleasant 78 yo WF with history of LBBB, nonobstructive CAD, CHF with dilated CM. S/p resynchronization therapy. Prior cardiac cath in 2005 showed 50% OM and mid RCA disease. She had a myoview study in August 2019 showing low EF and mid  anterior wall defect felt to be related to LBBB. Recently the patient noted increased weight gain of 4 lbs and increased edema. Took metolazone  on Friday and has been taking Demadex 2 tablets twice a day. On Monday she developed some intermittent left precordial chest pain. Some radiation to back. No association with activity, position, cough or deep breathing. Thought something was wrong with her pacemaker so came in for evaluation. Has not had any pain for last 24 hours.   On exam she is pleasant in NAD VSS. HR 80. No JVD Lungs are clear. CV IRR. Gr 2/6 systolic murmur RUSB. No tenderness to palpation in chest. Pacer site looks good.  Abd soft NT No edema  Impression; 1. Atypical chest pain. troponins all 0.05 flat trend. Ecg shows LBBB. Symptoms are atypical for ACS. Prior ischemic work up as noted above. Chest pain resolved. At this point would continue medical therapy. Will increase Toprol XL for better rate control. EF has normalized on Echo. OK to DC home today from a cardiac standpoint. Follow up in the office with Dr. Lovena Le. If she continues to have symptoms of chest pain may need further ischemic work up as outpatient. 2. Chronic systolic CHF. Much of this related to dyssynchrony with LBBB. EF now normalized by Echo. Continue dig and metoprolol. Hold metolazone for hypokalemia and elevated creatinine. Continue demadex.  3. AFib rate is controlled but not ideal with HRs in the 80s. To allow more BiV pacing will increase Toprol XL to 75 mg daily. On dig. Continue Xarelto. 4. S/p Aortic stent graft. Continue ASA 5. Acute on chronic kidney failure. Suspect rising creatinine and hypokalemia related to recent metolazone use. Hold. Appears euvolemic now.  6. Hypokalemia replete.   Offie Waide [place], [place] 05/24/2018 9:48 AM

## 2018-05-24 NOTE — Telephone Encounter (Signed)
Patient's family member, [name], called the office today to schedule the patient a hospital follow up appointment. The patient is being discharged today (05/24/2018). There was no questions, and homehealth was not being started upon discharge from hospital. Follow up with Dr. Melford Aase is scheduled for (05/31/2018) at 2:00 P.M. -Katie [name]

## 2018-05-24 NOTE — Discharge Instructions (Signed)

## 2018-05-24 NOTE — Discharge Summary (Signed)
Katie [name], is a 78 y.o. female  DOB 1941/02/27  MRN [id number].  Admission date:  05/23/2018  Admitting Physician  Barb Merino, MD  Discharge Date:  05/24/2018   Primary MD  [name], MD  Recommendations for primary care physician for things to follow:   Will need to adjust Xarelto dose down to 15 mg if kidney function is not improving      Discharge Diagnosis    Principal Problem:   Chest pain Active Problems:   Essential hypertension   Atherosclerosis of native coronary artery of native heart without angina pectoris   Atrial fibrillation (HCC)   Chronic combined systolic and diastolic CHF (congestive heart failure) (HCC)   Pulmonary emphysema (HCC)   Gastroesophageal reflux disease without esophagitis   Hypothyroidism   CKD (chronic kidney disease) stage 3, GFR 30-59 ml/min (HCC)   Hyperlipidemia, mixed   LFTs abnormal   Hypokalemia   Acute renal failure superimposed on stage 3 chronic kidney disease (HCC)      Past Medical History:  Diagnosis Date  . AAA (abdominal aortic aneurysm) ([place])   . AICD (automatic cardioverter/defibrillator) present 11/10/2017  . Arthritis    "some in my knees" (11/10/2017)  . Atrial fibrillation ([place])   . CHF (congestive heart failure) ([place])   . Chronic bronchitis ([place])   . COPD (chronic obstructive pulmonary disease) ([place])    'they say I don't; have a problem breathing though" (11/10/2017)  . Depression   . GERD (gastroesophageal reflux disease)   . Gout    "daily RX" (11/10/2017)  . History of hiatal hernia   . Hyperlipidemia   . Hypertension   . Hypothyroid   . Migraine headache    "hx; none since 1980s" (11/10/2017)  . Osteopenia   . Pneumonia    "~ 3 times" (11/10/2017)  . PVD (peripheral vascular disease) ([place])     Past Surgical History:  Procedure  Laterality Date  . ABDOMINAL AORTIC ENDOVASCULAR STENT GRAFT N/A 04/11/2013   Procedure: ABDOMINAL AORTIC ENDOVASCULAR STENT GRAFT WITH RIGHT FEMORAL PATCH ANGIOPLASTY;  Surgeon: Mal Misty, MD;  Location: [facility];  Service: Vascular;  Laterality: N/A;  . BIV ICD INSERTION CRT-D  11/10/2017  . BIV ICD INSERTION CRT-D N/A 11/10/2017   Procedure: BIV ICD INSERTION CRT-D;  Surgeon: Evans Lance, MD;  Location: [facility];  Service: Cardiovascular;  Laterality: N/A;  . BREAST SURGERY     LEFT BREAST BIOPSY  . CARDIAC CATHETERIZATION    . CATARACT EXTRACTION W/ INTRAOCULAR LENS  IMPLANT, BILATERAL Bilateral   . CHOLECYSTECTOMY OPEN  1972  . COLONOSCOPY    . EYE SURGERY Bilateral    "bleeding in my eyes"  . FRACTURE SURGERY    . TIBIA FRACTURE SURGERY Left 1970s  . TONSILLECTOMY    . VARICOSE VEIN SURGERY Bilateral    "laser"       HPI  from the history and physical done on the day of admission:    Katie [name] is a  78 y.o. female with medical history significant of multiple medical problems including AAA, CHF with ejection fraction of 35%, status post AICD, chronic A. fib on Xarelto, COPD, CKD stage III/IV, hypertension hyperlipidemia who is presenting to the emergency room with intermittent chest pain and back pain since last 2 days.  Patient is poor historian.  She says that she had these occasional pain however they are more frequent since last 2 days.  She points her pain to the xiphisternum, worse with movement, same pain radiating to the middle back, moderate in intensity, relieved by nitroglycerin and morphine in the ER.  Patient noted poor appetite and some chills few days ago.  She denies any nausea, vomiting, cough cold or flulike symptoms.  No sick contacts.  She does have chronic swelling of the extremities.  She states she gained about 5 pound weight over last 1 month despite being on double dose of diuretics.  Denies any shortness of breath.  Does not use oxygen at  home.  ED Course: Hemodynamically stable.  Her oxygen measured 88-90% on room air.  She is on 2 L oxygen for comfort.  Potassium is 2.8.  Creatinine is 2.2, her creatinine has been fluctuating anywhere between 1.7-2.8.  Patient was given nitroglycerin with no improvement, she received 2 doses of morphine and that is for pain.  On my examination her pain was not present. She does have slightly abnormal LFTs including total bilirubin of 2.3, AST 60/ALT 14.  Previous liver function tests were normal.     Hospital Course:    1.  Chest pain, Atypical: Currently chest pain improved with nitroglycerin and morphine. Aspirin given in the ER.  She is on Xarelto. Nitroglycerin and Morphine were given as needed for severe and recurrent pain.  EKG is a paced rhythm with LBBB.  Troponins appear to be mildly elevated up to 0.05, but review of records show that this is more likely chronic in nature.  Chest x-ray showed marked cardiac silhouette.  Echocardiogram was obtained which revealed an EF of 55 to 60%, but patient was in atrial fibrillation diastolic function cannot be formally evaluated.  She had just recently had a stress test on 10/20/2017 which noted a medium size defect of mild severity in the mid anterior septal location which represented intermediate risk study with EF noted to be around 34%.  Cardiology evaluated patient and recommended better rate control of atrial fibrillation for her symptoms and continuation of medical management.  2.  Chronic atrial fibrillation: Patient was noted to have some rapid heart rate for which cardiology recommended increasing dose of metoprolol to 75 mg. Therapeutic on Xarelto that she was continued on.  3.  Chronic combined systolic and diastolic heart failure: Status post biventricular AICD.  Patient complained of 5 pound weight gain.  Patient was continued on digoxin, metoprolol, and Demadex.  Recommended holding metolazone.  Adjustments were proposed to her diuretic  regimen, discussed with the patient who agreed to follow-up with her primary care provider, and to continue to monitor her weight.   4.  Acute kidney injury superimposed on chronic kidney disease: Patient presented with a creatinine elevated up to 2.2 on admission.  Increasing creatinine was thought to be related to combination of metolazone and Demadex.  Baseline previously have been around 1.60 or less.  Patient was advised to avoid metolazone and And repeat kidney function the following day was down to 1.92.      5.  Hypokalemia: Replaced.  6.  COPD, hypoxia:  Fairly stable.  No exacerbation.  Bronchodilator as needed.  Patient is needed to be hypoxic with ambulation as low as 82%.  Case management helped arrange for patient to have home oxygen at discharge.  Patient had been advised to be started on oxygen treatment previously for, but had declined due to fear of being dependent.  7.  History of aortic stent graft: Patient was continued on aspirin.  8.  Abnormal LFTs: Patient has very minimal abnormality on her LFTs.  May indicate some types of transient liver injury.  Will decrease a dose of Tylenol since taking.  She is status post cholecystectomy.  Lipase was normal.  No obstructive pattern.        Follow UP     Consults obtained: Cardiology  Discharge Condition: Stable  Diet and Activity recommendation: See Discharge Instructions below   Discharge Instructions    Discharge instructions   Complete by:  As directed    Follow with Primary MD [name], MD within 1 week.  During your hospitalization you were evaluated by cardiology for your symptoms of chest pain.  They are recommending that we increase your metoprolol to 75 mg daily.  Cardiology will call you with a follow-up appointment in their office.  Due to the decline in your kidney function it is recommend that your primary care doctor possibly refer you to a nephrologist/kidney doctor.  Your oxygen saturations  were noted to be low even on room air, and therefore care management will set you up with home oxygen.  Get CBC and BMP-  checked  by Primary MD within 5-7 days ( we routinely change or add medications that can affect your baseline labs and fluid status, therefore we recommend that you get the mentioned basic workup next visit with your PCP, your PCP may decide not to get them or add new tests based on their clinical decision)  Equipment: DME oxygen  Activity: As tolerated with fall precautions  Disposition: Home   Diet: Heart Healthy   For Heart failure patients - Check your Weight same time everyday, if you gain over 2 pounds, or you develop in leg swelling, experience more shortness of breath or chest pain, call your Primary doctor immediately. Follow Cardiac Low Salt Diet and 1.5 lit/day fluid restriction.  Special Instructions: If you have smoked or chewed Tobacco  in the last 2 yrs please stop smoking, stop any regular Alcohol  and or any Recreational drug use.  On your next visit with your primary care physician please Get Medicines reviewed and adjusted.  Please request your [name], MD to go over all Hospital Tests and Procedure/Radiological results at the follow up, please get all Hospital records sent to your Prim MD by signing hospital release before you go home.  If you experience worsening of your admission symptoms, develop shortness of breath, life threatening emergency, suicidal or homicidal thoughts you must seek medical attention immediately by calling 911 or calling your MD immediately  if symptoms less severe.  You Must read complete instructions/literature along with all the possible adverse reactions/side effects for all the Medicines you take and that have been prescribed to you. Take any new Medicines after you have completely understood and accpet all the possible adverse reactions/side effects.   Do not drive, operate heavy machinery, perform activities at  heights, swimming or participation in water activities or provide baby sitting services if your were admitted for syncope or siezures until you have seen by Primary MD or a Neurologist  and advised to do so again.  Do not drive when taking Pain medications.  Do not take more than prescribed Pain, Sleep and Anxiety Medications  Wear Seat belts while driving.   Please note  You were cared for by a hospitalist during your hospital stay. If you have any questions about your discharge medications or the care you received while you were in the hospital after you are discharged, you can call the unit and asked to speak with the hospitalist on call if the hospitalist that took care of you is not available. Once you are discharged, your primary care physician will handle any further medical issues. Please note that NO REFILLS for any discharge medications will be authorized once you are discharged, as it is imperative that you return to your primary care physician (or establish a relationship with a primary care physician if you do not have one) for your aftercare needs so that they can reassess your need for medications and monitor your lab values.        Discharge Medications     Allergies as of 05/24/2018      Reactions   Amiodarone Other (See Comments)   PULMONARY TOXICITY   Diovan [valsartan] Other (See Comments)   HYPOTENSION   Doxycycline Diarrhea, Other (See Comments)   VISUAL DISTURBANCE   Flexeril [cyclobenzaprine] Other (See Comments)   FATIGUE   Keflex [cephalexin] Diarrhea   Verapamil Other (See Comments)   EDEMA   Codeine Hives      Medication List    TAKE these medications   acetaminophen 500 MG tablet Commonly known as:  TYLENOL Take 1,000 mg by mouth every 6 (six) hours as needed for moderate pain.   albuterol 108 (90 Base) MCG/ACT inhaler Commonly known as:  ProAir HFA 2 inhalations 10-15 minutes apart evert 4 hours as needed for Asthma Flare What changed:    how  much to take  how to take this  when to take this  reasons to take this  additional instructions   allopurinol 300 MG tablet Commonly known as:  ZYLOPRIM TAKE 1 TABLET BY MOUTH EVERY DAY   aspirin EC 81 MG tablet Take 81 mg by mouth daily.   Avenova 0.01 % Soln Apply 1 application topically See admin instructions. Wash eyelids twice daily with cleaner   baclofen 10 MG tablet Commonly known as:  LIORESAL Take 1 tablet (10 mg total) by mouth at bedtime as needed for muscle spasms.   cetirizine 10 MG tablet Commonly known as:  ZYRTEC Take 10 mg by mouth as needed for allergies.   digoxin 0.125 MG tablet Commonly known as:  LANOXIN Take 1 tablet (0.125 mg total) by mouth daily.   gabapentin 300 MG capsule Commonly known as:  NEURONTIN Take 300 mg by mouth 3 (three) times daily.   ipratropium 0.06 % nasal spray Commonly known as:  ATROVENT Use 1 to 2 sprays each Nostril 2 to 3 x / day as needed What changed:    how much to take  how to take this  when to take this  reasons to take this  additional instructions   isosorbide mononitrate 30 MG 24 hr tablet Commonly known as:  IMDUR TAKE 1 TABLET DAILY FOR BP & HEART What changed:  See the new instructions.   levothyroxine 100 MCG tablet Commonly known as:  Synthroid Take 1 tablet daily on an empty stomach with only water for 30 minutes. What changed:    how much to take  how to take this  when to take this  additional instructions   lidocaine 5 % Commonly known as:  Lidoderm Place 1 patch onto the skin daily as needed. What changed:  reasons to take this   metolazone 2.5 MG tablet Commonly known as:  ZAROXOLYN 1 pill 30 mins prior to fluid pill as needed/directed What changed:    how much to take  how to take this  when to take this  additional instructions   metoprolol succinate 25 MG 24 hr tablet Commonly known as:  TOPROL-XL Take 3 tablets (75 mg total) by mouth daily. Take with or  immediately following a meal. Start taking on:  May 25, 2018 What changed:    medication strength  how much to take   montelukast 10 MG tablet Commonly known as:  SINGULAIR Take 1 tablet daily for Allergies & Asthma What changed:    how much to take  how to take this  when to take this  additional instructions   Mucinex 600 MG 12 hr tablet Generic drug:  guaiFENesin Take 1 tablet by mouth daily as needed (runny nose).   pantoprazole 40 MG tablet Commonly known as:  PROTONIX Take 1 tablet (40 mg total) by mouth daily.   potassium chloride SA 20 MEQ tablet Commonly known as:  Klor-Con M20 TAKE 2 TABLETS (40 MEQ TOTAL) BY MOUTH DAILY. What changed:    how much to take  how to take this  when to take this  additional instructions   pravastatin 40 MG tablet Commonly known as:  PRAVACHOL Take 40 mg by mouth daily.   prednisoLONE acetate 1 % ophthalmic suspension Commonly known as:  PRED FORTE Place 1 drop into both eyes 2 (two) times daily.   prochlorperazine 5 MG tablet Commonly known as:  COMPAZINE TAKE 1 TABLET BY MOUTH THREE TIMES A DAY AS NEEDED FOR VERTIGO OR NAUSEA What changed:  See the new instructions.   rivaroxaban 20 MG Tabs tablet Commonly known as:  XARELTO Take 1 tablet (20 mg total) by mouth daily with supper.   SYSTANE OP Place 1 drop into both eyes 4 (four) times daily as needed (dry eyes).   torsemide 20 MG tablet Commonly known as:  DEMADEX Take 2 tablets (40 mg total) by mouth 2 (two) times daily. What changed:  See the new instructions.   triamcinolone cream 0.1 % Commonly known as:  KENALOG Apply 1 application topically daily.   Vitamin D3 125 MCG (5000 UT) Caps Take 5,000 Units by mouth daily.            Durable Medical Equipment  (From admission, onward)         Start     Ordered   05/24/18 1050  DME Oxygen  Once    Question Answer Comment  Mode or (Route) Nasal cannula   Liters per Minute 2   Frequency  Continuous (stationary and portable oxygen unit needed)   Oxygen conserving device Yes   Oxygen delivery system Gas      05/24/18 1057          Major procedures and Radiology Reports - PLEASE review detailed and final reports for all details, in brief -   Echocardiogram 05/23/2018 1. The left ventricle has normal systolic function, with an ejection fraction of 55-60%. The cavity size was normal. There is mildly increased left ventricular wall thickness. Left ventricular diastolic function could not be evaluated secondary to  atrial fibrillation.  2. The right ventricle  has normal systolic function. The cavity was normal. Right ventricular systolic pressure is mildly elevated with an estimated pressure of 35.7 mmHg.  3. Left atrial size was mildly dilated.  4. The mitral valve is grossly normal.  5. The aortic valve is tricuspid Moderate calcification of the aortic valve. Aortic valve regurgitation is mild by color flow Doppler. mild stenosis of the aortic valve.  6. Normal LV systolic function; mild LVH; mild biatrial enlargement; calcified aortic valve with mild AS (mean gradient 10 mmHg) and mild AI; mild TR with mildly elevated pulmonary pressure.  Dg Chest 2 View  Result Date: 05/23/2018 CLINICAL DATA:  Mid chest pain tonight. EXAM: CHEST - 2 VIEW COMPARISON:  11/11/2017 FINDINGS: Cardiac pacemaker. Diffuse cardiac enlargement. No vascular congestion, edema, or consolidation. No blunting of costophrenic angles. No pneumothorax. Mediastinal contours appear intact. Calcification of the aorta. IMPRESSION: Cardiac enlargement. No evidence of active pulmonary disease. Electronically Signed   By: Lucienne Capers M.D.   On: 05/23/2018 02:25   Vas [place] Evar Duplex  Result Date: 05/12/2018 ABDOMINAL AORTA STUDY Indications: Follow up exam for EVAR. Surgery date 04/11/2013. Limitations: Air/bowel gas and obesity.  Performing Technologist: Alvia Grove RVT  Examination Guidelines: A complete  evaluation includes B-mode imaging, spectral Doppler, color Doppler, and power Doppler as needed of all accessible portions of each vessel. Bilateral testing is considered an integral part of a complete examination. Limited examinations for reoccurring indications may be performed as noted.  Endovascular Aortic Repair (EVAR): +----------+----------------+-------------------+-------------------+           Diameter AP (cm)Diameter Trans (cm)Velocities (cm/sec) +----------+----------------+-------------------+-------------------+ Aorta     3.50            3.70               40                  +----------+----------------+-------------------+-------------------+ Right Limb1.27            1.39               57                  +----------+----------------+-------------------+-------------------+ Left Limb 1.25            1.27               53                  +----------+----------------+-------------------+-------------------+  Summary: IVC/Iliac: Patent stent graft with the largest sac diamater measuring 3.50 x 3.70. Prior exam 04/27/2017 was not well visualized, limited visability. The limbs of the sac not well visualized in their entirety.  *See table(s) above for measurements and observations.  Electronically signed by Servando Snare MD on 05/12/2018 at 12:02:46 PM.   Final    Xr Toe Great Right  Result Date: 04/25/2018 Right great toe 2 views: Proximal phalanx fracture maintenance overall good position alignment.  No significant callus formation.   Micro Results     No results found for this or any previous visit (from the past 240 hour(s)).     Today   Subjective    Katie [name] today states that she is ready to go home and has not had recurrence of chest pain symptoms.  She states that she does not want to be on oxygen at home because she does not want to become dependent on it.  Family present in the room convice her to at least take oxygen and use it  only if she needs it.    Objective   Blood pressure 123/61, pulse 71, temperature 98.1 F (36.7 C), temperature source Oral, resp. rate 18, height 5\' 4"  (1.626 m), weight 91.4 kg, SpO2 94 %.  No intake or output data in the 24 hours ending 05/24/18 1201  Exam  Constitutional: Elderly obese female NAD, calm, comfortable Eyes: PERRL, lids and conjunctivae normal ENMT: Mucous membranes are moist. Posterior pharynx clear of any exudate or lesions.Normal dentition.  Neck: normal, supple, no masses, no thyromegaly Respiratory: clear to auscultation bilaterally, no wheezing, no crackles. Normal respiratory effort. No accessory muscle use.  Cardiovascular: Regular rate and rhythm, no murmurs / rubs / gallops. No extremity edema. 2+ pedal pulses. No carotid bruits.  Abdomen: no tenderness, no masses palpated. No hepatosplenomegaly. Bowel sounds positive.  Musculoskeletal: no clubbing / cyanosis. No joint deformity upper and lower extremities. Good ROM, no contractures. Normal muscle tone.  Skin: no rashes, lesions, ulcers. No induration Neurologic: CN 2-12 grossly intact. Sensation intact, DTR normal. Strength 5/5 in all 4.  Psychiatric: Normal judgment and insight. Alert and oriented x 3. Normal mood.    Data Review   CBC w Diff:  Lab Results  Component Value Date   WBC 8.6 05/23/2018   HGB 15.9 (H) 05/23/2018   HCT 48.8 (H) 05/23/2018   HCT 48.5 (H) 07/07/2016   PLT 213 05/23/2018   LYMPHOPCT 17 10/31/2017   MONOPCT 11.0 05/16/2018   EOSPCT 2.2 05/16/2018   BASOPCT 0.5 05/16/2018    CMP:  Lab Results  Component Value Date   NA 134 (L) 05/24/2018   NA 144 07/22/2016   K 3.4 (L) 05/24/2018   CL 92 (L) 05/24/2018   CO2 30 05/24/2018   BUN 54 (H) 05/24/2018   BUN 20 07/22/2016   CREATININE 1.92 (H) 05/24/2018   CREATININE 2.67 (H) 05/16/2018   GLU 79 07/22/2016   PROT 6.0 (L) 05/24/2018   ALBUMIN 3.6 05/24/2018   BILITOT 0.9 05/24/2018   ALKPHOS 35 (L) 05/24/2018   AST 21 05/24/2018   ALT 15  05/24/2018  .   Total Time in preparing paper work, data evaluation and todays exam - 35 minutes  Norval Morton M.D on 05/24/2018 at [facility]   Office  [phone]

## 2018-05-24 NOTE — TOC Initial Note (Signed)
Transition of Care [facility]) - Initial/Assessment Note    Patient Details  Name: Katie [name] MRN: [id number] Date of Birth: 07-06-1940  Transition of Care [facility]) CM/SW Contact:    Midge Minium RN, BSN, NCM-BC, ACM-RN [phone] Phone Number: 05/24/2018, 12:23 PM  Clinical Narrative:                 CM met with patient to discuss dispositional needs. Patient lives at home with her spouse and was independent with ADLs PTA; ambulates with a cane. CMS DME Compare list was provided with [employer] selected. DME referral given to [facility] [place] liaison; AVS updated. No further needs from CM.   Expected Discharge Plan: Home/Self Care Barriers to Discharge: No Barriers Identified   Patient Goals and CMS Choice Patient states their goals for this hospitalization and ongoing recovery are:: "To not keep wearing oxygen" CMS Medicare.gov Compare Post Acute Care list provided to:: Patient Choice offered to / list presented to : Patient  Expected Discharge Plan and Services Expected Discharge Plan: Home/Self Care Discharge Planning Services: CM Consult Post Acute Care Choice: Durable Medical Equipment Living arrangements for the past 2 months: Single Family Home Expected Discharge Date: 05/24/18               DME Arranged: Oxygen DME Agency: [employer] HH Arranged: NA [place] Agency: NA  Prior Living Arrangements/Services Living arrangements for the past 2 months: Single Family Home Lives with:: Self, Spouse Patient language and need for interpreter reviewed:: No Do you feel safe going back to the place where you live?: Yes      Need for Family Participation in Patient Care: No (Comment) Care giver support system in place?: Yes (comment) Current home services: (N/A) Criminal Activity/Legal Involvement Pertinent to Current Situation/Hospitalization: No - Comment as needed  Activities of Daily Living Home Assistive Devices/Equipment: Walker (specify type) ADL Screening (condition at  time of admission) Patient's cognitive ability adequate to safely complete daily activities?: Yes Is the patient deaf or have difficulty hearing?: No Does the patient have difficulty seeing, even when wearing glasses/contacts?: No Does the patient have difficulty concentrating, remembering, or making decisions?: No Patient able to express need for assistance with ADLs?: Yes Does the patient have difficulty dressing or bathing?: No Independently performs ADLs?: Yes (appropriate for developmental age) Does the patient have difficulty walking or climbing stairs?: Yes Weakness of Legs: Both Weakness of Arms/Hands: None  Permission Sought/Granted Permission sought to share information with : Case Manager Permission granted to share information with : Yes, Verbal Permission Granted              Emotional Assessment Appearance:: Appears stated age Attitude/Demeanor/Rapport: Gracious, Engaged Affect (typically observed): Accepting, Calm Orientation: : Oriented to Self, Oriented to Place, Oriented to  Time, Oriented to Situation Alcohol / Substance Use: Not Applicable Psych Involvement: No (comment)  Admission diagnosis:  Atypical chest pain [R07.89] Patient Active Problem List   Diagnosis Date Noted  . Hypokalemia   . Acute renal failure superimposed on stage 3 chronic kidney disease ([place])   . LFTs abnormal 05/23/2018  . Hypercalcemia 05/17/2018  . Prediabetes 02/02/2018  . COPD (chronic obstructive pulmonary disease) with chronic bronchitis ([place]) 02/02/2018  . Post-traumatic arthritis of ankle, left 11/29/2017  . Shortness of breath   . Aortic atherosclerosis ([place]) 10/17/2017  . Depression 09/21/2017  . Bradycardia 09/21/2017  . PAH (pulmonary artery hypertension) ([place]) 06/23/2017  . Esophageal dysphagia 05/15/2017  . Gout 03/20/2017  . Chest pain 07/07/2016  . Abnormal  glucose 09/19/2015  . AAA (abdominal aortic aneurysm) without rupture ([place]) 04/22/2015  . Morbid obesity  ([place]) 08/03/2014  . PVD (peripheral vascular disease) with claudication ([place]) 10/16/2013  . CKD (chronic kidney disease) stage 3, GFR 30-59 ml/min (HCC) 06/08/2013  . Hyperlipidemia, mixed 06/08/2013  . 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 Long term current use of anticoagulant therapy 04/23/2013  . Vitamin D deficiency 02/15/2013  . Hypothyroidism   . Osteopenia   . Chronic combined systolic and diastolic CHF (congestive heart failure) ([place]) 11/25/2008  . Migraine headache 11/22/2008  . Essential hypertension 11/22/2008  . Atherosclerosis of native coronary artery of native heart without angina pectoris 11/22/2008  . Atrial fibrillation ([place]) 11/22/2008  . Pulmonary emphysema ([place]) 11/22/2008  . Gastroesophageal reflux disease without esophagitis 11/22/2008   PCP:  [name], MD Pharmacy:   [id number] [place] [id number]: [phone]: [phone]    Social Determinants of Health (SDOH) Interventions    Readmission Risk Interventions  No flowsheet data found.

## 2018-05-26 ENCOUNTER — Other Ambulatory Visit: Payer: Self-pay

## 2018-05-26 ENCOUNTER — Ambulatory Visit: Payer: Medicare Other | Admitting: Internal Medicine

## 2018-05-26 ENCOUNTER — Encounter: Payer: Self-pay | Admitting: Internal Medicine

## 2018-05-26 VITALS — BP 96/60 | HR 70 | Ht 64.0 in | Wt 205.0 lb

## 2018-05-26 DIAGNOSIS — I10 Essential (primary) hypertension: Secondary | ICD-10-CM

## 2018-05-26 DIAGNOSIS — I5042 Chronic combined systolic (congestive) and diastolic (congestive) heart failure: Secondary | ICD-10-CM

## 2018-05-26 DIAGNOSIS — Z95810 Presence of automatic (implantable) cardiac defibrillator: Secondary | ICD-10-CM | POA: Diagnosis not present

## 2018-05-26 DIAGNOSIS — I4821 Permanent atrial fibrillation: Secondary | ICD-10-CM | POA: Diagnosis not present

## 2018-05-26 NOTE — Progress Notes (Signed)
HPI Mrs. Wiegert returns today for followup of her ICD, atrial fib, chronic systolic heart failure, LBBB, and COPD. She was in the hosptial a few days ago with rapid atrial fib. She has had her dose of metoprolol increased to 75 mg daily. She denies chest pain but did have pain when she was going fast in her atrial fib. The patient denies syncope. She has been prescribed oxygen. She wonders if she needs it.  Allergies  Allergen Reactions  . Amiodarone Other (See Comments)    PULMONARY TOXICITY  . Diovan [Valsartan] Other (See Comments)    HYPOTENSION  . Doxycycline Diarrhea and Other (See Comments)    VISUAL DISTURBANCE  . Flexeril [Cyclobenzaprine] Other (See Comments)    FATIGUE  . Keflex [Cephalexin] Diarrhea  . Verapamil Other (See Comments)    EDEMA  . Codeine Hives     Current Outpatient Medications  Medication Sig Dispense Refill  . acetaminophen (TYLENOL) 500 MG tablet Take 1,000 mg by mouth every 6 (six) hours as needed for moderate pain.     Marland Kitchen albuterol (PROAIR HFA) 108 (90 Base) MCG/ACT inhaler 2 inhalations 10-15 minutes apart evert 4 hours as needed for Asthma Flare (Patient taking differently: Inhale 2 puffs into the lungs every 4 (four) hours as needed for shortness of breath (asthma flare). ) 48 g 3  . allopurinol (ZYLOPRIM) 300 MG tablet TAKE 1 TABLET BY MOUTH EVERY DAY (Patient taking differently: Take 300 mg by mouth daily. ) 90 tablet 1  . aspirin EC 81 MG tablet Take 81 mg by mouth daily.    . baclofen (LIORESAL) 10 MG tablet Take 1 tablet (10 mg total) by mouth at bedtime as needed for muscle spasms. 30 each 3  . cetirizine (ZYRTEC) 10 MG tablet Take 10 mg by mouth as needed for allergies.     . Cholecalciferol (VITAMIN D3) 5000 units CAPS Take 5,000 Units by mouth daily.     . digoxin (LANOXIN) 0.125 MG tablet Take 1 tablet (0.125 mg total) by mouth daily. 90 tablet 1  . Eyelid Cleansers (AVENOVA) 0.01 % SOLN Apply 1 application topically See admin  instructions. Wash eyelids twice daily with cleaner  99  . gabapentin (NEURONTIN) 300 MG capsule Take 300 mg by mouth 3 (three) times daily.     Marland Kitchen guaiFENesin (MUCINEX) 600 MG 12 hr tablet Take 1 tablet by mouth daily as needed (runny nose).     Marland Kitchen ipratropium (ATROVENT) 0.06 % nasal spray Use 1 to 2 sprays each Nostril 2 to 3 x / day as needed (Patient taking differently: Place 1-2 sprays into both nostrils 3 (three) times daily as needed for rhinitis. ) 45 mL 3  . isosorbide mononitrate (IMDUR) 30 MG 24 hr tablet TAKE 1 TABLET DAILY FOR BP & HEART (Patient taking differently: Take 30 mg by mouth daily. ) 90 tablet 1  . levothyroxine (SYNTHROID) 100 MCG tablet Take 1 tablet daily on an empty stomach with only water for 30 minutes. (Patient taking differently: Take 100 mcg by mouth daily before breakfast. ) 90 tablet 1  . lidocaine (LIDODERM) 5 % Place 1 patch onto the skin daily as needed. (Patient taking differently: Place 1 patch onto the skin daily as needed (pain). ) 30 patch 6  . metolazone (ZAROXOLYN) 2.5 MG tablet 1 pill 30 mins prior to fluid pill as needed/directed (Patient taking differently: Take 2.5 mg by mouth See admin instructions. Take as directed by MD via phone call)  30 tablet 0  . metoprolol succinate (TOPROL-XL) 25 MG 24 hr tablet Take 3 tablets (75 mg total) by mouth daily. Take with or immediately following a meal. 90 tablet 0  . montelukast (SINGULAIR) 10 MG tablet Take 1 tablet daily for Allergies & Asthma (Patient taking differently: Take 10 mg by mouth at bedtime. ) 90 tablet 1  . pantoprazole (PROTONIX) 40 MG tablet Take 1 tablet (40 mg total) by mouth daily.    [name] Glycol-Propyl Glycol (SYSTANE OP) Place 1 drop into both eyes 4 (four) times daily as needed (dry eyes).     . potassium chloride SA (KLOR-CON M20) 20 MEQ tablet TAKE 2 TABLETS (40 MEQ TOTAL) BY MOUTH DAILY. (Patient taking differently: Take 40 mEq by mouth daily. ) 180 tablet 1  . pravastatin (PRAVACHOL)  40 MG tablet Take 40 mg by mouth daily.    . prednisoLONE acetate (PRED FORTE) 1 % ophthalmic suspension Place 1 drop into both eyes 2 (two) times daily.   1  . prochlorperazine (COMPAZINE) 5 MG tablet TAKE 1 TABLET BY MOUTH THREE TIMES A DAY AS NEEDED FOR VERTIGO OR NAUSEA (Patient taking differently: Take 5 mg by mouth daily as needed for nausea (vertigo). ) 60 tablet 1  . rivaroxaban (XARELTO) 20 MG TABS tablet Take 1 tablet (20 mg total) by mouth daily with supper. 30 tablet 5  . torsemide (DEMADEX) 20 MG tablet Take 2 tablets (40 mg total) by mouth 2 (two) times daily.    Marland Kitchen triamcinolone cream (KENALOG) 0.1 % Apply 1 application topically daily.      No current facility-administered medications for this visit.      Past Medical History:  Diagnosis Date  . AAA (abdominal aortic aneurysm) ([place])   . AICD (automatic cardioverter/defibrillator) present 11/10/2017  . Arthritis    "some in my knees" (11/10/2017)  . Atrial fibrillation ([place])   . CHF (congestive heart failure) ([place])   . Chronic bronchitis ([place])   . COPD (chronic obstructive pulmonary disease) ([place])    'they say I don't; have a problem breathing though" (11/10/2017)  . Depression   . GERD (gastroesophageal reflux disease)   . Gout    "daily RX" (11/10/2017)  . History of hiatal hernia   . Hyperlipidemia   . Hypertension   . Hypothyroid   . Migraine headache    "hx; none since 1980s" (11/10/2017)  . Osteopenia   . Pneumonia    "~ 3 times" (11/10/2017)  . PVD (peripheral vascular disease) ([place])     ROS:   All systems reviewed and negative except as noted in the HPI.   Past Surgical History:  Procedure Laterality Date  . ABDOMINAL AORTIC ENDOVASCULAR STENT GRAFT N/A 04/11/2013   Procedure: ABDOMINAL AORTIC ENDOVASCULAR STENT GRAFT WITH RIGHT FEMORAL PATCH ANGIOPLASTY;  Surgeon: Mal Misty, MD;  Location: [facility];  Service: Vascular;  Laterality: N/A;  . BIV ICD INSERTION CRT-D  11/10/2017  . BIV ICD INSERTION  CRT-D N/A 11/10/2017   Procedure: BIV ICD INSERTION CRT-D;  Surgeon: Evans Lance, MD;  Location: [facility];  Service: Cardiovascular;  Laterality: N/A;  . BREAST SURGERY     LEFT BREAST BIOPSY  . CARDIAC CATHETERIZATION    . CATARACT EXTRACTION W/ INTRAOCULAR LENS  IMPLANT, BILATERAL Bilateral   . CHOLECYSTECTOMY OPEN  1972  . COLONOSCOPY    . EYE SURGERY Bilateral    "bleeding in my eyes"  . FRACTURE SURGERY    . TIBIA FRACTURE  SURGERY Left 1970s  . TONSILLECTOMY    . VARICOSE VEIN SURGERY Bilateral    "laser"     Family History  Problem Relation Age of Onset  . Cirrhosis Mother   . Cancer Mother 74       PANCREAS  . Heart defect Sister   . Breast cancer Sister        age 62  . Heart disease Sister   . Stroke Sister   . Alcohol abuse Father   . Depression Father   . Hypertension Brother   . Hyperlipidemia Son   . Heart disease Daughter      Social History   Socioeconomic History  . Marital status: Married    Spouse name: Not on file  . Number of children: Not on file  . Years of education: Not on file  . Highest education level: Not on file  Occupational History  . Not on file  Social Needs  . Financial resource strain: Somewhat hard  . Food insecurity:    Worry: Never true    Inability: Never true  . Transportation needs:    Medical: No    Non-medical: No  Tobacco Use  . Smoking status: Former Smoker    Packs/day: 2.00    Years: 54.00    Pack years: 108.00    Types: Cigarettes    Last attempt to quit: 08/10/2002    Years since quitting: 15.8  . Smokeless tobacco: Never Used  Substance and Sexual Activity  . Alcohol use: Yes    Comment: 11/10/2017 "glass of wine q 3-4 months"  . Drug use: Never  . Sexual activity: Yes    Birth control/protection: Post-menopausal  Lifestyle  . Physical activity:    Days per week: 3 days    Minutes per session: 40 min  . Stress: Not at all  Relationships  . Social connections:    Talks on phone:  More than three times a week    Gets together: Twice a week    Attends religious service: More than 4 times per year    Active member of club or organization: Yes    Attends meetings of clubs or organizations: Never    Relationship status: Married  . Intimate partner violence:    Fear of current or ex partner: No    Emotionally abused: No    Physically abused: No    Forced sexual activity: No  Other Topics Concern  . Not on file  Social History Narrative   Lives in [place] with husband   One daughter     BP 96/60   Pulse 70   Ht 5\' 4"  (1.626 m)   Wt 205 lb (93 kg)   SpO2 97%   BMI 35.19 kg/m   Physical Exam:  Well appearing NAD HEENT: Unremarkable Neck:  No JVD, no thyromegally Lymphatics:  No adenopathy Back:  No CVA tenderness Lungs:  Clear with no wheezes HEART:  Regular rate rhythm, no murmurs, no rubs, no clicks Abd:  soft, positive bowel sounds, no organomegally, no rebound, no guarding Ext:  2 plus pulses, no edema, no cyanosis, no clubbing Skin:  No rashes no nodules Neuro:  CN II through XII intact, motor grossly intact  ECG - atrial fib with biv pacing  DEVICE  Normal device function.  See PaceArt for details.   Assess/Plan: 1. Atrial fib - she has had some rapid HR's and we will have her take more toprol to help control her HR.  2.  Chronic systolic heart failure - she is s/p biv ICD. 3. ICD - her  [employer] device appears to be working normally.  4. COPD - She is not wheezing today. She will continue her current meds.  Mikle Bosworth.D.

## 2018-05-26 NOTE — Patient Instructions (Addendum)
Medication Instructions:  Your physician recommends that you continue on your current medications as directed. Please refer to the Current Medication list given to you today.  Labwork: None ordered.  Testing/Procedures: None ordered.  Follow-Up: Your physician wants you to follow-up in: 6 months with Dr. Lovena Le.   You will receive a reminder letter in the mail two months in advance. If you don't receive a letter, please call our office to schedule the follow-up appointment.  Remote monitoring is used to monitor your ICD from home. This monitoring reduces the number of office visits required to check your device to one time per year. It allows [place] to keep an eye on the functioning of your device to ensure it is working properly. You are scheduled for a device check from home on 08/14/2018. You may send your transmission at any time that day. If you have a wireless device, the transmission will be sent automatically. After your physician reviews your transmission, you will receive a postcard with your next transmission date.  Any Other Special Instructions Will Be Listed Below (If Applicable).  If you need a refill on your cardiac medications before your next appointment, please call your pharmacy.

## 2018-05-29 ENCOUNTER — Telehealth: Payer: Self-pay | Admitting: Internal Medicine

## 2018-05-29 NOTE — Telephone Encounter (Signed)
New Message   Patient want's to speak to a nurse states wrong type of oxygen tank was ordered for her.

## 2018-05-29 NOTE — Telephone Encounter (Signed)
Returned call to Pt.  Advised Pt to call DME company if she is unhappy with her equipment.  Advised we are only signing orders, what she qualifies for is related to Medicare guidelines.  Pt states she will call [facility].

## 2018-05-30 LAB — CUP PACEART INCLINIC DEVICE CHECK
HighPow Impedance: 84 Ohm
Implantable Lead Implant Date: 20190829
Implantable Lead Implant Date: 20190829
Implantable Lead Implant Date: 20190829
Implantable Lead Location: 753858
Implantable Lead Location: 753859
Implantable Lead Location: 753860
Implantable Lead Model: 292
Implantable Lead Model: 4674
Implantable Lead Model: 7740
Implantable Lead Serial Number: 696999
Implantable Lead Serial Number: 827497
Implantable Pulse Generator Implant Date: 20190829
Lead Channel Impedance Value: 1063 Ohm
Lead Channel Impedance Value: 429 Ohm
Lead Channel Impedance Value: 602 Ohm
Lead Channel Pacing Threshold Amplitude: 0.7 V
Lead Channel Pacing Threshold Amplitude: 0.7 V
Lead Channel Pacing Threshold Pulse Width: 0.4 ms
Lead Channel Sensing Intrinsic Amplitude: 1.4 mV
Lead Channel Sensing Intrinsic Amplitude: 6.1 mV
Lead Channel Sensing Intrinsic Amplitude: 6.9 mV
Lead Channel Setting Pacing Amplitude: 2 V
Lead Channel Setting Pacing Amplitude: 2 V
Lead Channel Setting Pacing Pulse Width: 0.4 ms
Lead Channel Setting Pacing Pulse Width: 0.4 ms
Lead Channel Setting Sensing Sensitivity: 0.5 mV
Lead Channel Setting Sensing Sensitivity: 1 mV
MDC IDC LEAD SERIAL: 446776
MDC IDC MSMT LEADCHNL LV PACING THRESHOLD PULSEWIDTH: 0.4 ms
MDC IDC SESS DTM: 20200313040000
Pulse Gen Serial Number: 215092

## 2018-05-31 ENCOUNTER — Ambulatory Visit (INDEPENDENT_AMBULATORY_CARE_PROVIDER_SITE_OTHER): Payer: Medicare Other | Admitting: Internal Medicine

## 2018-05-31 ENCOUNTER — Other Ambulatory Visit: Payer: Self-pay

## 2018-05-31 VITALS — BP 122/70 | HR 76 | Temp 97.0°F | Resp 18 | Ht 64.0 in | Wt 207.6 lb

## 2018-05-31 DIAGNOSIS — J439 Emphysema, unspecified: Secondary | ICD-10-CM | POA: Diagnosis not present

## 2018-05-31 DIAGNOSIS — I2510 Atherosclerotic heart disease of native coronary artery without angina pectoris: Secondary | ICD-10-CM

## 2018-05-31 DIAGNOSIS — I5042 Chronic combined systolic (congestive) and diastolic (congestive) heart failure: Secondary | ICD-10-CM

## 2018-05-31 DIAGNOSIS — N183 Chronic kidney disease, stage 3 unspecified: Secondary | ICD-10-CM

## 2018-05-31 DIAGNOSIS — E782 Mixed hyperlipidemia: Secondary | ICD-10-CM

## 2018-05-31 DIAGNOSIS — I10 Essential (primary) hypertension: Secondary | ICD-10-CM | POA: Diagnosis not present

## 2018-05-31 DIAGNOSIS — R945 Abnormal results of liver function studies: Secondary | ICD-10-CM

## 2018-05-31 DIAGNOSIS — K219 Gastro-esophageal reflux disease without esophagitis: Secondary | ICD-10-CM

## 2018-05-31 DIAGNOSIS — E039 Hypothyroidism, unspecified: Secondary | ICD-10-CM | POA: Diagnosis not present

## 2018-05-31 DIAGNOSIS — E876 Hypokalemia: Secondary | ICD-10-CM

## 2018-05-31 DIAGNOSIS — I482 Chronic atrial fibrillation, unspecified: Secondary | ICD-10-CM | POA: Diagnosis not present

## 2018-05-31 DIAGNOSIS — I2583 Coronary atherosclerosis due to lipid rich plaque: Secondary | ICD-10-CM

## 2018-05-31 DIAGNOSIS — R7989 Other specified abnormal findings of blood chemistry: Secondary | ICD-10-CM

## 2018-05-31 DIAGNOSIS — R072 Precordial pain: Secondary | ICD-10-CM

## 2018-05-31 DIAGNOSIS — N179 Acute kidney failure, unspecified: Secondary | ICD-10-CM

## 2018-05-31 DIAGNOSIS — Z79899 Other long term (current) drug therapy: Secondary | ICD-10-CM

## 2018-05-31 NOTE — Progress Notes (Signed)
Katie [name]     This very nice 78 y.o. MWF was admitted to the hospital on 05/23/2018 and patient was discharged from the hospital 6 days ago on 05/24/2018. The patient now presents for follow up for transition from recent hospitalization.  The day after discharge  our clinical staff contacted the patient to assure stability and schedule a follow up appointment. The discharge summary, medications and diagnostic test results were reviewed before meeting with the patient. The patient was admitted for:   Chest pain  Essential hypertension  Atherosclerosis of native coronary artery of native heart without angina pectoris  Atrial fibrillation (HCC)  Chronic combined systolic and diastolic CHF (congestive heart failure) (HCC)  Pulmonary emphysema (HCC)  Gastroesophageal reflux disease without esophagitis  Hypothyroidism  CKD (chronic kidney disease) stage 3, GFR 30-59 ml/min (HCC)  Hyperlipidemia, mixed  LFTs abnormal  Hypokalemia  Acute renal failure superimposed on stage 3 chronic kidney disease ([place])        Patient was admitted with CP and diuresed 15# for Heart Failure. With aggressive diuresis, her renal functions worsened and then diuretics were withheld.     Katie Lizaola Sneadis a  Very nice 78 y.o.femalefollowed with HTN,  CHF (EF 35%), chronic Afib on Xarelto, BiVentricular AICD, COPD, Hyperlipidemia,  AAA and CKD3       Hospitalization discharge instructions and medications are reconciled with the patient.      Patient is also followed with Hypertension, Hyperlipidemia, Pre-Diabetes and Vitamin D Deficiency. Potassium dropped low and was replaced orally.      Patient is treated for HTN & BP has been controlled at home. Today's BP is at goal - 122/70. Patient has had no complaints of any cardiac type chest pain, palpitations, dyspnea/orthopnea/PND, dizziness, claudication, or dependent edema.     Hyperlipidemia is controlled with diet & meds. Patient denies myalgias or  other med SE's. Last Lipids were at goal albeit elevated Trig's: Lab Results  Component Value Date   CHOL 169 05/16/2018   HDL 31 (L) 05/16/2018   LDLCALC 96 05/16/2018   TRIG 315 (H) 05/16/2018   CHOLHDL 5.5 (H) 05/16/2018      Also, the patient has history of T2_NIDDM PreDiabetes and has had no symptoms of reactive hypoglycemia, diabetic polys, paresthesias or visual blurring.  Last A1c was at goal: Lab Results  Component Value Date   HGBA1C 5.5 01/05/2018      Further, the patient also has history of Vitamin D Deficiency and supplements vitamin D without any suspected side-effects. Last vitamin D was  At goal: Lab Results  Component Value Date   VD25OH 66 01/05/2018   Current Outpatient Medications on File Prior to Visit  Medication Sig  . acetaminophen (TYLENOL) 500 MG tablet Take 1,000 mg by mouth every 6 (six) hours as needed for moderate pain.   Marland Kitchen albuterol (PROAIR HFA) 108 (90 Base) MCG/ACT inhaler 2 inhalations 10-15 minutes apart evert 4 hours as needed for Asthma Flare (Patient taking differently: Inhale 2 puffs into the lungs every 4 (four) hours as needed for shortness of breath (asthma flare). )  . allopurinol (ZYLOPRIM) 300 MG tablet TAKE 1 TABLET BY MOUTH EVERY DAY (Patient taking differently: Take 300 mg by mouth daily. )  . aspirin EC 81 MG tablet Take 81 mg by mouth daily.  . cetirizine (ZYRTEC) 10 MG tablet Take 10 mg by mouth as needed for allergies.   . Cholecalciferol (VITAMIN D3) 5000 units CAPS Take 5,000 Units by mouth daily.   Marland Kitchen  digoxin (LANOXIN) 0.125 MG tablet Take 1 tablet (0.125 mg total) by mouth daily.  . Eyelid Cleansers (AVENOVA) 0.01 % SOLN Apply 1 application topically See admin instructions. Wash eyelids twice daily with cleaner  . gabapentin (NEURONTIN) 600 MG tablet Take 600 mg by mouth 3 (three) times daily.  Marland Kitchen guaiFENesin (MUCINEX) 600 MG 12 hr tablet Take 1 tablet by mouth daily as needed (runny nose).   Marland Kitchen ipratropium (ATROVENT) 0.06 % nasal  spray Use 1 to 2 sprays each Nostril 2 to 3 x / day as needed (Patient taking differently: Place 1-2 sprays into both nostrils 3 (three) times daily as needed for rhinitis. )  . isosorbide mononitrate (IMDUR) 30 MG 24 hr tablet TAKE 1 TABLET DAILY FOR BP & HEART (Patient taking differently: Take 30 mg by mouth daily. )  . levothyroxine (SYNTHROID) 100 MCG tablet Take 1 tablet daily on an empty stomach with only water for 30 minutes. (Patient taking differently: Take 100 mcg by mouth daily before breakfast. )  . metolazone (ZAROXOLYN) 2.5 MG tablet 1 pill 30 mins prior to fluid pill as needed/directed (Patient taking differently: Take 2.5 mg by mouth See admin instructions. Take as directed by MD via phone call)  . metoprolol succinate (TOPROL-XL) 25 MG 24 hr tablet Take 3 tablets (75 mg total) by mouth daily. Take with or immediately following a meal.  . montelukast (SINGULAIR) 10 MG tablet Take 1 tablet daily for Allergies & Asthma (Patient taking differently: Take 10 mg by mouth at bedtime. )  . pantoprazole (PROTONIX) 40 MG tablet Take 1 tablet (40 mg total) by mouth daily.  [name] Glycol-Propyl Glycol (SYSTANE OP) Place 1 drop into both eyes 4 (four) times daily as needed (dry eyes).   . potassium chloride SA (KLOR-CON M20) 20 MEQ tablet TAKE 2 TABLETS (40 MEQ TOTAL) BY MOUTH DAILY. (Patient taking differently: Take 40 mEq by mouth daily. )  . pravastatin (PRAVACHOL) 40 MG tablet Take 40 mg by mouth daily.  . prednisoLONE acetate (PRED FORTE) 1 % ophthalmic suspension Place 1 drop into both eyes 2 (two) times daily.   . prochlorperazine (COMPAZINE) 5 MG tablet TAKE 1 TABLET BY MOUTH THREE TIMES A DAY AS NEEDED FOR VERTIGO OR NAUSEA (Patient taking differently: Take 5 mg by mouth daily as needed for nausea (vertigo). )  . rivaroxaban (XARELTO) 20 MG TABS tablet Take 1 tablet (20 mg total) by mouth daily with supper.  . torsemide (DEMADEX) 20 MG tablet Take 2 tablets (40 mg total) by mouth 2  (two) times daily.  Marland Kitchen triamcinolone cream (KENALOG) 0.1 % Apply 1 application topically daily.   . baclofen (LIORESAL) 10 MG tablet Take 1 tablet (10 mg total) by mouth at bedtime as needed for muscle spasms. (Patient not taking: Reported on 05/31/2018)   No current facility-administered medications on file prior to visit.    Allergies  Allergen Reactions  . Amiodarone Other (See Comments)    PULMONARY TOXICITY  . Diovan [Valsartan] Other (See Comments)    HYPOTENSION  . Doxycycline Diarrhea and Other (See Comments)    VISUAL DISTURBANCE  . Flexeril [Cyclobenzaprine] Other (See Comments)    FATIGUE  . Keflex [Cephalexin] Diarrhea  . Verapamil Other (See Comments)    EDEMA  . Codeine Hives   PMHx:   Past Medical History:  Diagnosis Date  . AAA (abdominal aortic aneurysm) ([place])   . AICD (automatic cardioverter/defibrillator) present 11/10/2017  . Arthritis    "some in my knees" (11/10/2017)  .  Atrial fibrillation ([place])   . CHF (congestive heart failure) ([place])   . Chronic bronchitis ([place])   . COPD (chronic obstructive pulmonary disease) ([place])    'they say I don't; have a problem breathing though" (11/10/2017)  . Depression   . GERD (gastroesophageal reflux disease)   . Gout    "daily RX" (11/10/2017)  . History of hiatal hernia   . Hyperlipidemia   . Hypertension   . Hypothyroid   . Migraine headache    "hx; none since 1980s" (11/10/2017)  . Osteopenia   . Pneumonia    "~ 3 times" (11/10/2017)  . PVD (peripheral vascular disease) ([place])    Immunization History  Administered Date(s) Administered  . Influenza, High Dose Seasonal PF 12/20/2013, 12/09/2014, 11/14/2015, 01/10/2017, 12/02/2017  . Influenza-Unspecified 01/02/2013  . PPD Test 07/17/2016  . Pneumococcal Conjugate-13 01/23/2014  . Pneumococcal-Unspecified 03/15/1993, 05/31/2008  . Td 03/16/2007  . Tdap 08/11/2017  . Varicella 02/19/2008   Past Surgical History:  Procedure Laterality Date  . ABDOMINAL AORTIC  ENDOVASCULAR STENT GRAFT N/A 04/11/2013   Procedure: ABDOMINAL AORTIC ENDOVASCULAR STENT GRAFT WITH RIGHT FEMORAL PATCH ANGIOPLASTY;  Surgeon: Mal Misty, MD;  Location: [facility];  Service: Vascular;  Laterality: N/A;  . BIV ICD INSERTION CRT-D  11/10/2017  . BIV ICD INSERTION CRT-D N/A 11/10/2017   Procedure: BIV ICD INSERTION CRT-D;  Surgeon: Evans Lance, MD;  Location: [facility];  Service: Cardiovascular;  Laterality: N/A;  . BREAST SURGERY     LEFT BREAST BIOPSY  . CARDIAC CATHETERIZATION    . CATARACT EXTRACTION W/ INTRAOCULAR LENS  IMPLANT, BILATERAL Bilateral   . CHOLECYSTECTOMY OPEN  1972  . COLONOSCOPY    . EYE SURGERY Bilateral    "bleeding in my eyes"  . FRACTURE SURGERY    . TIBIA FRACTURE SURGERY Left 1970s  . TONSILLECTOMY    . VARICOSE VEIN SURGERY Bilateral    "laser"   FHx:    Reviewed / unchanged  SHx:    Reviewed / unchanged  Systems Review:  Constitutional: Denies fever, chills, wt changes, headaches, insomnia, fatigue, night sweats, change in appetite. Eyes: Denies redness, blurred vision, diplopia, discharge, itchy, watery eyes.  ENT: Denies discharge, congestion, post nasal drip, epistaxis, sore throat, earache, hearing loss, dental pain, tinnitus, vertigo, sinus pain, snoring.  CV: Denies chest pain, palpitations, irregular heartbeat, syncope, dyspnea, diaphoresis, orthopnea, PND, claudication or edema. Respiratory: denies cough, dyspnea, DOE, pleurisy, hoarseness, laryngitis, wheezing.  Gastrointestinal: Denies dysphagia, odynophagia, heartburn, reflux, water brash, abdominal pain or cramps, nausea, vomiting, bloating, diarrhea, constipation, hematemesis, melena, hematochezia  or hemorrhoids. Genitourinary: Denies dysuria, frequency, urgency, nocturia, hesitancy, discharge, hematuria or flank pain. Musculoskeletal: Denies arthralgias, myalgias, stiffness, jt. swelling, pain, limping or strain/sprain.  Skin: Denies pruritus, rash, hives, warts,  acne, eczema or change in skin lesion(s). Neuro: No weakness, tremor, incoordination, spasms, paresthesia or pain. Psychiatric: Denies confusion, memory loss or sensory loss. Endo: Denies change in weight, skin or hair change.  Heme/Lymph: No excessive bleeding, bruising or enlarged lymph nodes.  Physical Exam  BP 122/70   Pulse 76   Temp (!) 97 F (36.1 C)   Resp 18   Ht 5\' 4"  (1.626 m)   Wt 207 lb 9.6 oz (94.2 kg)   SpO2 97%   BMI 35.63 kg/m   Appears well nourished, well groomed  and in no distress.  Eyes: PERRLA, EOMs, conjunctiva no swelling or erythema. Sinuses: No frontal/maxillary tenderness ENT/Mouth: EAC's clear, TM's nl w/o erythema,  bulging. Nares clear w/o erythema, swelling, exudates. Oropharynx clear without erythema or exudates. Oral hygiene is good. Tongue normal, non obstructing. Hearing intact.  Neck: Supple. Thyroid nl. Car 2+/2+ without bruits, nodes or JVD. Chest: Respirations nl with BS clear & equal w/o rales, rhonchi, wheezing or stridor.  Cor: Heart sounds normal w/ regular rate and rhythm without sig. murmurs, gallops, clicks or rubs. Peripheral pulses normal and equal  without edema.  Abdomen: Soft & bowel sounds normal. Non-tender w/o guarding, rebound, hernias, masses or organomegaly.  Lymphatics: Unremarkable.  Musculoskeletal: Full ROM all peripheral extremities, joint stability, 5/5 strength and normal gait.  Skin: Warm, dry without exposed rashes, lesions or ecchymosis apparent.  Neuro: Cranial nerves intact, reflexes equal bilaterally. Sensory-motor testing grossly intact. Tendon reflexes grossly intact.  Pysch: Alert & oriented x 3.  Insight and judgement nl & appropriate. No ideations.  Assessment and Plan:  - Continue medication, monitor blood pressure at home.  - Continue DASH diet. Reminder to go to the ER if any CP,  SOB, nausea, dizziness, severe HA, changes vision/speech.   - Continue diet/meds, exercise,& lifestyle modifications.   - Continue monitor periodic cholesterol/liver & renal functions   - Continue diet, exercise, lifestyle modifications.  - Monitor appropriate labs. - Continue supplementation.      Discussed  regular exercise, BP monitoring, weight control to achieve/maintain BMI less than 25 and discussed meds and SE's. Recommended labs to assess and monitor clinical status with further disposition pending results of labs. Over 30 minutes of exam, counseling, chart review was performed.

## 2018-05-31 NOTE — Patient Instructions (Signed)

## 2018-06-01 ENCOUNTER — Other Ambulatory Visit: Payer: Self-pay | Admitting: Physician Assistant

## 2018-06-01 LAB — COMPLETE METABOLIC PANEL WITH GFR
AG Ratio: 1.8 (calc) (ref 1.0–2.5)
ALT: 10 U/L (ref 6–29)
AST: 14 U/L (ref 10–35)
Albumin: 4 g/dL (ref 3.6–5.1)
Alkaline phosphatase (APISO): 34 U/L — ABNORMAL LOW (ref 37–153)
BUN/Creatinine Ratio: 12 (calc) (ref 6–22)
BUN: 19 mg/dL (ref 7–25)
CALCIUM: 9.7 mg/dL (ref 8.6–10.4)
CO2: 28 mmol/L (ref 20–32)
CREATININE: 1.6 mg/dL — AB (ref 0.60–0.93)
Chloride: 101 mmol/L (ref 98–110)
GFR, EST NON AFRICAN AMERICAN: 31 mL/min/{1.73_m2} — AB (ref >=60)
GFR, Est African American: 36 mL/min/{1.73_m2} — ABNORMAL LOW (ref >=60)
GLUCOSE: 86 mg/dL (ref 65–99)
Globulin: 2.2 g/dL (calc) (ref 1.9–3.7)
Potassium: 4.1 mmol/L (ref 3.5–5.3)
Sodium: 141 mmol/L (ref 135–146)
Total Bilirubin: 0.6 mg/dL (ref 0.2–1.2)
Total Protein: 6.2 g/dL (ref 6.1–8.1)

## 2018-06-01 LAB — CBC WITH DIFFERENTIAL/PLATELET
Absolute Monocytes: 697 cells/uL (ref 200–950)
Basophils Absolute: 57 cells/uL (ref 0–200)
Basophils Relative: 0.7 %
Eosinophils Absolute: 292 cells/uL (ref 15–500)
Eosinophils Relative: 3.6 %
HCT: 42.4 % (ref 35.0–45.0)
Hemoglobin: 14 g/dL (ref 11.7–15.5)
Lymphs Abs: 2106 cells/uL (ref 850–3900)
MCH: 30.4 pg (ref 27.0–33.0)
MCHC: 33 g/dL (ref 32.0–36.0)
MCV: 92 fL (ref 80.0–100.0)
MPV: 11.1 fL (ref 7.5–12.5)
Monocytes Relative: 8.6 %
NEUTROS ABS: 4949 {cells}/uL (ref 1500–7800)
Neutrophils Relative %: 61.1 %
Platelets: 219 10*3/uL (ref 140–400)
RBC: 4.61 10*6/uL (ref 3.80–5.10)
RDW: 14.6 % (ref 11.0–15.0)
Total Lymphocyte: 26 %
WBC: 8.1 10*3/uL (ref 3.8–10.8)

## 2018-06-01 LAB — TSH: TSH: 4.16 mIU/L (ref 0.40–4.50)

## 2018-06-01 LAB — MAGNESIUM: Magnesium: 1.9 mg/dL (ref 1.5–2.5)

## 2018-06-04 ENCOUNTER — Other Ambulatory Visit: Payer: Self-pay | Admitting: Internal Medicine

## 2018-06-04 ENCOUNTER — Encounter: Payer: Self-pay | Admitting: Internal Medicine

## 2018-06-05 ENCOUNTER — Telehealth: Payer: Self-pay | Admitting: *Deleted

## 2018-06-05 NOTE — Telephone Encounter (Signed)
Patient called and reported low BP reading of 80's/50's,  She states she has stopped her BP medications, per Dr Melford Aase, is not dizzy, but feels sleepy.  Patient has increased her fluid intake to 60 ounces a day.  At the time of our conversation, the patient checked her BP and it was 117/63, with a pulse of 73.  Per Dr Melford Aase, if her BP becomes low again and her symptoms change, she will need to go to the ED.  Patient is aware.

## 2018-06-06 ENCOUNTER — Telehealth: Payer: Self-pay | Admitting: *Deleted

## 2018-06-06 ENCOUNTER — Telehealth (INDEPENDENT_AMBULATORY_CARE_PROVIDER_SITE_OTHER): Payer: Self-pay

## 2018-06-06 NOTE — Telephone Encounter (Signed)
Patient called and reported her BP readings seem to be going up.  The range is 106/64 to 131/99 this afternoon. Per Dr Melford Aase, she should restart the Metoprolol 25 mg at 1/2 tablet in the morning and take Isosorbide 30 mg 1/2 tablet in the evening. She can increase the Isosorbide 30 mg to a whole tablet, if her BP continues to increase.

## 2018-06-06 NOTE — Telephone Encounter (Signed)
Tried calling patient about appt 03/25  No answer unable to ask prescreening questions.

## 2018-06-07 ENCOUNTER — Other Ambulatory Visit (INDEPENDENT_AMBULATORY_CARE_PROVIDER_SITE_OTHER): Payer: Self-pay

## 2018-06-07 ENCOUNTER — Ambulatory Visit (INDEPENDENT_AMBULATORY_CARE_PROVIDER_SITE_OTHER): Payer: Medicare Other | Admitting: Orthopaedic Surgery

## 2018-06-07 ENCOUNTER — Other Ambulatory Visit: Payer: Self-pay | Admitting: Physician Assistant

## 2018-06-07 ENCOUNTER — Other Ambulatory Visit: Payer: Self-pay

## 2018-06-07 ENCOUNTER — Encounter (INDEPENDENT_AMBULATORY_CARE_PROVIDER_SITE_OTHER): Payer: Self-pay | Admitting: Orthopaedic Surgery

## 2018-06-07 ENCOUNTER — Ambulatory Visit (INDEPENDENT_AMBULATORY_CARE_PROVIDER_SITE_OTHER): Payer: Medicare Other

## 2018-06-07 DIAGNOSIS — S92411D Displaced fracture of proximal phalanx of right great toe, subsequent encounter for fracture with routine healing: Secondary | ICD-10-CM | POA: Diagnosis not present

## 2018-06-07 DIAGNOSIS — M5441 Lumbago with sciatica, right side: Principal | ICD-10-CM

## 2018-06-07 DIAGNOSIS — G8929 Other chronic pain: Secondary | ICD-10-CM

## 2018-06-07 DIAGNOSIS — I5042 Chronic combined systolic (congestive) and diastolic (congestive) heart failure: Secondary | ICD-10-CM

## 2018-06-07 NOTE — Progress Notes (Signed)
The patient is coming back for follow-up as it relates to a right great toe proximal phalanx fracture.  She is wearing regular shoes now and still has pain in her great toe.  It is only been just over 2 months since her injury.  On examination, her right great toe has just a little bit of pain to her but no swelling.  Her range of motion is full.  Stressing the fracture site causes some pain.  There is no ligamentous instability of the great toe.  She has weak pulses in both her feet.  X-rays of her right great toe show no significant malalignment of the fracture.  There is interval healing of the fracture of the phalanx shaft.  At this point we can see her back as needed for her right great toe.  She did recently see my partner Dr. Junius Roads who had recommended a consultation with Dr. Ernestina Patches to determine whether or not intervention her lumbar spine is appropriate.  She cannot have an MRI and she may benefit from a CT scan but I do feel it is not unreasonable to have a consultation with Dr. Ernestina Patches for her lumbar spine.

## 2018-06-08 ENCOUNTER — Telehealth (INDEPENDENT_AMBULATORY_CARE_PROVIDER_SITE_OTHER): Payer: Self-pay | Admitting: Orthopaedic Surgery

## 2018-06-08 ENCOUNTER — Other Ambulatory Visit: Payer: Medicare Other | Admitting: Internal Medicine

## 2018-06-08 DIAGNOSIS — I5042 Chronic combined systolic (congestive) and diastolic (congestive) heart failure: Secondary | ICD-10-CM

## 2018-06-08 DIAGNOSIS — J449 Chronic obstructive pulmonary disease, unspecified: Secondary | ICD-10-CM

## 2018-06-08 DIAGNOSIS — I482 Chronic atrial fibrillation, unspecified: Secondary | ICD-10-CM

## 2018-06-08 NOTE — Progress Notes (Signed)
HIS ENCOUNTER IS A VIRTUAL VISIT DUE TO COVID-19 - PATIENT WAS NOT SEEN IN THE OFFICE. PATIENT HAS CONSENTED TO VIRTUAL VISIT / TELEMEDICINE VISIT  Virtual Visit via Video Note     I connected with Katie [name] on 06/08/18 at  by a audio enabled telemedicine application and verified that I am speaking with the correct person using two identifiers.      I discussed the limitations of evaluation and management by telemedicine and the availability of in person appointments. The patient expressed understanding and agreed to proceed.  History of Present Illness:    Patient called for advice on Meds. Recently BP's have been running lower - occasionally less than 100/sys. She apparently had been taking more her Zaroxolyn more frequently, but after a phone call last weekend (6 days ago) she had been been holding it & trying to increase her fluids. As requested, she has been monitoring postural standing BP's.  She also was advised to hold her Toprol 25 and Imdur 30 mg til BP's back up over 110 and only then to restart at Toprol at 1/2 tab (12.5 mg) qam and her Imdur 30 mg at 1/2 tab qpm. She denies any exertional CP or Orthopnea / PND.    Medications  Current Outpatient Medications (Endocrine & Metabolic):  .  levothyroxine (SYNTHROID) 100 MCG tablet, Take 1 tablet daily on an empty stomach with only water for 30 minutes. (Patient taking differently: Take 100 mcg by mouth daily before breakfast. )  Current Outpatient Medications (Cardiovascular):  .  digoxin (LANOXIN) 0.125 MG tablet, Take 1 tablet (0.125 mg total) by mouth daily. .  isosorbide mononitrate (IMDUR) 30 MG 24 hr tablet, TAKE 1 TABLET DAILY FOR BP & HEART (Patient taking differently: Take 30 mg by mouth daily. ) .  metolazone (ZAROXOLYN) 2.5 MG tablet, TAKE 1 TABLET BY MOUTH 30 MINS PRIOR TO FLUID PILL AS NEEDED AS DIRECTED .  metoprolol succinate (TOPROL-XL) 25 MG 24 hr tablet, Take 3 tablets (75 mg total) by mouth daily. Take with or  immediately following a meal. .  pravastatin (PRAVACHOL) 40 MG tablet, Take 40 mg by mouth daily. Marland Kitchen  torsemide (DEMADEX) 20 MG tablet, Take 2 tablets (40 mg total) by mouth 2 (two) times daily.  Current Outpatient Medications (Respiratory):  .  albuterol (PROAIR HFA) 108 (90 Base) MCG/ACT inhaler, 2 inhalations 10-15 minutes apart evert 4 hours as needed for Asthma Flare (Patient taking differently: Inhale 2 puffs into the lungs every 4 (four) hours as needed for shortness of breath (asthma flare). ) .  cetirizine (ZYRTEC) 10 MG tablet, Take 10 mg by mouth as needed for allergies.  Marland Kitchen  guaiFENesin (MUCINEX) 600 MG 12 hr tablet, Take 1 tablet by mouth daily as needed (runny nose).  Marland Kitchen  ipratropium (ATROVENT) 0.06 % nasal spray, Use 1 to 2 sprays each Nostril 2 to 3 x / day as needed (Patient taking differently: Place 1-2 sprays into both nostrils 3 (three) times daily as needed for rhinitis. ) .  montelukast (SINGULAIR) 10 MG tablet, Take 1 tablet daily for Allergies & Asthma (Patient taking differently: Take 10 mg by mouth at bedtime. )  Current Outpatient Medications (Analgesics):  .  acetaminophen (TYLENOL) 500 MG tablet, Take 1,000 mg by mouth every 6 (six) hours as needed for moderate pain.  Marland Kitchen  allopurinol (ZYLOPRIM) 300 MG tablet, TAKE 1 TABLET BY MOUTH EVERY DAY (Patient taking differently: Take 300 mg by mouth daily. ) .  aspirin EC 81  MG tablet, Take 81 mg by mouth daily.  Current Outpatient Medications (Hematological):  .  rivaroxaban (XARELTO) 20 MG TABS tablet, Take 1 tablet (20 mg total) by mouth daily with supper.  Current Outpatient Medications (Other):  Marland Kitchen  Cholecalciferol (VITAMIN D3) 5000 units CAPS, Take 5,000 Units by mouth daily.  .  Eyelid Cleansers (AVENOVA) 0.01 % SOLN, Apply 1 application topically See admin instructions. Wash eyelids twice daily with cleaner .  gabapentin (NEURONTIN) 600 MG tablet, Take 600 mg by mouth 3 (three) times daily. .  pantoprazole (PROTONIX) 40  MG tablet, Take 1 tablet (40 mg total) by mouth daily. [name] Glycol-Propyl Glycol (SYSTANE OP), Place 1 drop into both eyes 4 (four) times daily as needed (dry eyes).  .  potassium chloride SA (KLOR-CON M20) 20 MEQ tablet, TAKE 2 TABLETS BY MOUTH DAILY .  prednisoLONE acetate (PRED FORTE) 1 % ophthalmic suspension, Place 1 drop into both eyes 2 (two) times daily.  .  prochlorperazine (COMPAZINE) 5 MG tablet, TAKE 1 TABLET BY MOUTH THREE TIMES A DAY AS NEEDED FOR VERTIGO OR NAUSEA (Patient taking differently: Take 5 mg by mouth daily as needed for nausea (vertigo). ) .  triamcinolone cream (KENALOG) 0.1 %, Apply 1 application topically daily.   Problem list She has Migraine headache; Essential hypertension; Atherosclerosis of native coronary artery of native heart without angina pectoris; Atrial fibrillation ([place]); Chronic combined systolic and diastolic CHF (congestive heart failure) ([place]); Pulmonary emphysema ([place]); Gastroesophageal reflux disease without esophagitis; Osteopenia; Hypothyroidism; Vitamin D deficiency; Long term current use of anticoagulant therapy; CKD (chronic kidney disease) stage 3, GFR 30-59 ml/min (HCC); Hyperlipidemia, mixed; Pulmonary Fibrosis sequellae of Amiodarone; PVD (peripheral vascular disease) with claudication ([place]); Morbid obesity ([place]); AAA (abdominal aortic aneurysm) without rupture ([place]); Abnormal glucose; Chest pain; Gout; Esophageal dysphagia; PAH (pulmonary artery hypertension) ([place]); Depression; Bradycardia; Aortic atherosclerosis ([place]); Shortness of breath; Post-traumatic arthritis of ankle, left; Prediabetes; COPD (chronic obstructive pulmonary disease) with chronic bronchitis ([place]); Hypercalcemia; LFTs abnormal; Hypokalemia; and Acute renal failure superimposed on stage 3 chronic kidney disease (HCC) on their problem list.   Observations/Objective:  VS's reported by patient are BP 105/62   P 66 at 10 am, BP 90 /53  P 82 at 1:30 pm and BP 87/53   P 86 at  4 pm and O2 sat's ranging 92-94-98 off O2.   Speech fluent, clear w/o halting and no coughing or wheezing evident.  ENT/Mouth: No hoarseness, No cough for duration of visit. Neuro: Alert and oriented X 3,  Psych:  normal affect, Insight and Judgment appropriate.   Assessment and Plan:  1. COPD (chronic obstructive pulmonary disease) with chronic bronchitis ([place])  2. Chronic atrial fibrillation  3. Chronic combined systolic and diastolic congestive heart failure (HCC)  Follow Up Instructions:       I discussed the assessment and treatment plan with the patient. The patient was provided an opportunity to ask questions and all were answered. The patient agreed with the plan and demonstrated an understanding of the instructions.      The patient was advised to call back or seek an in-person evaluation if the symptoms worsen or if the condition fails to improve as anticipated.     I provided 16 minutes of non-face-to-face time during this encounter.   Kirtland Bouchard, MD

## 2018-06-08 NOTE — Telephone Encounter (Signed)
Patient aware we are working on appt may take awhile due to the Covid 19

## 2018-06-08 NOTE — Telephone Encounter (Signed)
Pt called asking if she is still okay to have injs in her back with Dr. Ernestina Patches

## 2018-06-12 ENCOUNTER — Telehealth: Payer: Self-pay | Admitting: Internal Medicine

## 2018-06-12 NOTE — Telephone Encounter (Signed)
New Message   Patient is calling because Dr. Lovena Le was to reach out to [facility] to get her set up with a mini oxygen tank. But she states that [facility] does not have any and would like to discuss using someone else. Please call.

## 2018-06-13 ENCOUNTER — Encounter: Payer: Self-pay | Admitting: Internal Medicine

## 2018-06-13 NOTE — Telephone Encounter (Signed)
Call placed to [facility].  Per rep Pt never received the oxygen equipment she was ordered to go home with because it is out of stock with [facility] d/t disruption in shipping.  Call placed to [facility] and verfied their fax #.  [facility] to fax original hospital orders to [facility].  Pt notified no further action needed.

## 2018-06-13 NOTE — Progress Notes (Signed)
[facility]  [name], M.D.  Uvaldo Bristle. Silverio Lay, P.A.-C Liane Comber, [place], [place] [address] Telephone [phone] Telefax [phone]  Subjective:    Patient ID: Katie [name], female    DOB: 07/30/1940, 78 y.o.   MRN: [id number]  HPI   Patient is a very nice 78 yo MWF with HTN, HLD, cAfib, chronic combined HF with a BiVent ICD  Patient had recent hospitalization 3/10-3/01/2019 for CP with MI ruled out, but had CHF (EF 35%) & was diuresed 15#, then with worsening kidney functions necessitating holding her diuretics. Also since then her BP's had been widely fluctuating. On 03/26 her meds were reviewed    In hospital recently, room air resting O2 sat was 88% and dropped to 82% walking short distance and she requires 3 liters O2  To bring her O2 up to 91%.   Medication Sig  . acetaminophen  500 MG tablet Take 1,000 mg every 6  hours as needed for moderate pain.   Marland Kitchen PROAIR HFA  MCG/ACT inhaler 2 inhalations 10-15 minutes apart every 4 hours as needed   . allopurinol 300 MG tablet TAKE 1 TABLET BY MOUTH EVERY DAY   . aspirin EC 81 MG tablet Take  daily.  . Cetirizine 10 MG tablet Take  as needed for allergies.   Marland Kitchen VITAMIN D 5000 units CAPS Take  daily.   . digoxin0.125 MG tablet Take 1 tablet  daily.  . Gabapentin 600 MG tablet Take  3  times daily.  Marland Kitchen guaiFENesin  600 MG 12 hr tablet Take  daily as needed  . ipratropium  0.06 % nasal spray Use 1 to 2 sprays each Nostril 2 to 3 x / day as needed   . iIMDUR 30 MG 24 hr tablet TAKE 1/2 TABLET DAILY FOR BP & HEART   . levothyroxine  100 MCG tablet Take 1 tablet daily  . metolazone  2.5 MG tablet TAKE 1 TAB  30 MINS PRIOR TO FLUID PILL AS NEEDED AS DIRECTED  . metoprolol succ-XL 25 MG Takes a1/4 tab daily.   . Montelukast 10 MG tablet Take 1 tablet daily for Allergies & Asthma   . pantoprazole  40 MG tablet Take 1 tablet (40 mg  total) by mouth daily.  Carren Rang  Place 1 drop into  eyes 4  times daily  . potassium chloride SA  20 MEQ tablet TAKE 2 TABLETS BY MOUTH DAILY  . pravastatin 40 MG tablet Take 40 mg by mouth daily.  Marland Kitchen PRED FORTE 1 % ophth susp Place 1 drop into both eyes 2 (two) times daily.   . prochlorperazine 5 MG tablet TAKE 1 TABLET 3 x / DAY AS NEEDED   . rivaroxaban (XARELTO) 20 MG TABS tablet Take 1 tablet daily with supper.  . torsemide (DEMADEX) 20 MG tablet Take 2 tablets  by mouth 2  times daily.  Marland Kitchen triamcinolone cream (KENALOG) 0.1 % Apply 1 application topically daily.    Allergies  Allergen Reactions  . Amiodarone Other (See Comments)    PULMONARY TOXICITY  . Diovan [Valsartan] Other (See Comments)    HYPOTENSION  . Doxycycline Diarrhea and Other (See Comments)    VISUAL DISTURBANCE  . Flexeril [Cyclobenzaprine] Other (See Comments)    FATIGUE  . Keflex [Cephalexin] Diarrhea  . Verapamil Other (See Comments)    EDEMA  . Codeine Hives   Past Medical History:  Diagnosis  Date  . AAA (abdominal aortic aneurysm) ([place])   . AICD (automatic cardioverter/defibrillator) present 11/10/2017  . Arthritis    "some in my knees" (11/10/2017)  . Atrial fibrillation ([place])   . CHF (congestive heart failure) ([place])   . Chronic bronchitis ([place])   . COPD (chronic obstructive pulmonary disease) ([place])    'they say I don't; have a problem breathing though" (11/10/2017)  . Depression   . GERD (gastroesophageal reflux disease)   . Gout    "daily RX" (11/10/2017)  . History of hiatal hernia   . Hyperlipidemia   . Hypertension   . Hypothyroid   . Migraine headache    "hx; none since 1980s" (11/10/2017)  . Osteopenia   . Pneumonia    "~ 3 times" (11/10/2017)  . PVD (peripheral vascular disease) ([place])    Past Surgical History:  Procedure Laterality Date  . ABDOMINAL AORTIC ENDOVASCULAR STENT GRAFT N/A 04/11/2013   Procedure: ABDOMINAL AORTIC ENDOVASCULAR STENT GRAFT WITH RIGHT FEMORAL PATCH  ANGIOPLASTY;  Surgeon: Mal Misty, MD;  Location: [facility];  Service: Vascular;  Laterality: N/A;  . BIV ICD INSERTION CRT-D  11/10/2017  . BIV ICD INSERTION CRT-D N/A 11/10/2017   Procedure: BIV ICD INSERTION CRT-D;  Surgeon: Evans Lance, MD;  Location: [facility];  Service: Cardiovascular;  Laterality: N/A;  . BREAST SURGERY     LEFT BREAST BIOPSY  . CARDIAC CATHETERIZATION    . CATARACT EXTRACTION W/ INTRAOCULAR LENS  IMPLANT, BILATERAL Bilateral   . CHOLECYSTECTOMY OPEN  1972  . COLONOSCOPY    . EYE SURGERY Bilateral    "bleeding in my eyes"  . FRACTURE SURGERY    . TIBIA FRACTURE SURGERY Left 1970s  . TONSILLECTOMY    . VARICOSE VEIN SURGERY Bilateral    "laser"   Review of Systems    10 point systems review negative except as above.    Objective:   Physical Exam  BP 126/64   Pulse 72   Temp (!) 97 F (36.1 C)   Resp 20   Ht 5\' 4"  (1.626 m)   Wt 210 lb 6.4 oz (95.4 kg)   SpO2 (!) 80%   BMI 36.12 kg/m   Today in the Office walking in from the parking lot her O2 %sat was 76% and recovered to 80% and later 88%  after sitting in the exam room. Then walking 30 feet to the lab her O2 sat again dropped to 82% and with 3 lit Oxygen, her O2 % sat  Recovers to 92%  HEENT - WNL. Neck - supple.  Chest - Clear equal BS. Cor - Nl HS. irreg RR w/o sig MGR. PP obscured by 2-3 + pretibial / ankle edema. edema. MS- FROM w/o deformities.  Gait Nl. Neuro -  Nl w/o focal abnormalities.    Assessment & Plan:   1. Essential hypertension  - BASIC METABOLIC PANEL WITH GFR  2. Chronic obstructive pulmonary disease with hypoxia (HCC)  - Patient is recommended to have 24 hour Oxygen at 3 lit/min flow rate and recommend portable O2 concentrator  with a conserving device.  3. CKD (chronic kidney disease), symptom management only, stage 4 (severe) (HCC)  - BASIC METABOLIC PANEL WITH GFR - Nephrology referral pending labs  4. Chronic atrial fibrillation  - taper Xarelto  dose pending GFR  5. Chronic combined systolic and diastolic CHF (congestive heart failure) ([place])  6. Coronary artery disease due to lipid rich plaque  7. Medication management  -  BASIC METABOLIC PANEL WITH GFR   Over 25 minutes of exam, counseling, chart review and high complex critical decision making was performed.

## 2018-06-14 ENCOUNTER — Ambulatory Visit (INDEPENDENT_AMBULATORY_CARE_PROVIDER_SITE_OTHER): Payer: Medicare Other | Admitting: Internal Medicine

## 2018-06-14 ENCOUNTER — Telehealth: Payer: Self-pay | Admitting: Internal Medicine

## 2018-06-14 ENCOUNTER — Other Ambulatory Visit: Payer: Self-pay

## 2018-06-14 ENCOUNTER — Encounter: Payer: Self-pay | Admitting: Internal Medicine

## 2018-06-14 ENCOUNTER — Other Ambulatory Visit: Payer: Self-pay | Admitting: *Deleted

## 2018-06-14 VITALS — BP 126/64 | HR 72 | Temp 97.0°F | Resp 20 | Ht 64.0 in | Wt 210.4 lb

## 2018-06-14 DIAGNOSIS — Z1211 Encounter for screening for malignant neoplasm of colon: Secondary | ICD-10-CM

## 2018-06-14 DIAGNOSIS — I482 Chronic atrial fibrillation, unspecified: Secondary | ICD-10-CM

## 2018-06-14 DIAGNOSIS — J449 Chronic obstructive pulmonary disease, unspecified: Secondary | ICD-10-CM | POA: Diagnosis not present

## 2018-06-14 DIAGNOSIS — R0902 Hypoxemia: Secondary | ICD-10-CM

## 2018-06-14 DIAGNOSIS — I10 Essential (primary) hypertension: Secondary | ICD-10-CM | POA: Diagnosis not present

## 2018-06-14 DIAGNOSIS — Z79899 Other long term (current) drug therapy: Secondary | ICD-10-CM

## 2018-06-14 DIAGNOSIS — I2583 Coronary atherosclerosis due to lipid rich plaque: Secondary | ICD-10-CM

## 2018-06-14 DIAGNOSIS — I2510 Atherosclerotic heart disease of native coronary artery without angina pectoris: Secondary | ICD-10-CM

## 2018-06-14 DIAGNOSIS — I5042 Chronic combined systolic (congestive) and diastolic (congestive) heart failure: Secondary | ICD-10-CM | POA: Diagnosis not present

## 2018-06-14 DIAGNOSIS — N184 Chronic kidney disease, stage 4 (severe): Secondary | ICD-10-CM | POA: Diagnosis not present

## 2018-06-14 LAB — POC HEMOCCULT BLD/STL (HOME/3-CARD/SCREEN)
Card #2 Fecal Occult Blod, POC: NEGATIVE
Card #3 Fecal Occult Blood, POC: NEGATIVE
Fecal Occult Blood, POC: NEGATIVE

## 2018-06-14 NOTE — Patient Instructions (Signed)
Hypoxia Hypoxia is a condition that happens when there is a lack of oxygen in the body's tissues and organs. When there is not enough oxygen, organs cannot work as they should. This causes serious problems throughout the body and in the brain. What are the causes? This condition may be caused by:  Exposure to high altitude.  A collapsed lung (pneumothorax).  Lung infection (pneumonia).  Lung injury.  Long-term (chronic) lung disease, such as COPD (chronic obstructive pulmonary disease).  Blood collecting in the chest cavity (hemothorax).  Food, saliva, or vomit getting into the airway (aspiration).  Reduced blood flow (ischemia).  Severe blood loss.  Slow or shallow breathing (hypoventilation).  Blood disorders, such as anemia.  Carbon monoxide poisoning.  The heart suddenly stopping (cardiac arrest).  Anesthetic medicines.  Drowning.  Choking. What are the signs or symptoms? Symptoms of this condition include:  Headache.  Fatigue.  Drowsiness.  Forgetfulness.  Nausea.  Confusion.  Shortness of breath.  Dizziness.  Bluish color of the skin, lips, or nail beds (cyanosis).  Change in consciousness or awareness. If hypoxia is not treated, it can lead to convulsions, loss of consciousness (coma), or brain damage. How is this diagnosed? This condition may be diagnosed based on:  A physical exam.  Blood tests.  A test that measures how much oxygen is in your blood (pulse oximetry). This is done with a sensor that is placed on your finger, toe, or earlobe.  Chest X-ray.  Tests to check your lung function (pulmonary function tests).  A test to check the electrical activity of your heart (electrocardiogram, ECG). You may have other tests to determine the cause of your hypoxia. How is this treated?  Treatment for this condition depends on what is causing the hypoxia. You will likely be treated with oxygen therapy. This may be done by giving you oxygen  through a face mask or through tubes in your nose. Your health care provider may also recommend other therapies to treat the underlying cause of your hypoxia. Follow these instructions at home:  Take over-the-counter and prescription medicines only as told by your health care provider.  Do not use any products that contain nicotine or tobacco, such as cigarettes and e-cigarettes. If you need help quitting, ask your health care provider.  Avoid secondhand smoke.  Work with your health care provider to manage any chronic conditions you have that may be causing hypoxia, such as COPD.  Keep all follow-up visits as told by your health care provider. This is important. Contact a health care provider if:  You have a fever.  You have trouble breathing, even after treatment.  You become extremely short of breath when you exercise. Get help right away if:  Your shortness of breath gets worse, especially with normal or very little activity.  Your skin, lips, or nail beds have a bluish color.  You become confused or you cannot think properly.  You have chest pain. Summary  Hypoxia is a condition that happens when there is a lack of oxygen in the body's tissues and organs.  If hypoxia is not treated, it can lead to convulsions, loss of consciousness (coma), or brain damage.  Symptoms of hypoxia can include a headache, shortness of breath, confusion, nausea, and a bluish skin color.  Hypoxia has many possible causes, including exposure to high altitude, carbon monoxide poisoning, or other health issues, such as blood disorders or cardiac arrest.  Hypoxia is usually treated with oxygen therapy. This information is not   intended to replace advice given to you by your health care provider. Make sure you discuss any questions you have with your health care provider. Document Released: 04/19/2016 Document Revised: 04/19/2016 Document Reviewed: 04/19/2016 Elsevier Interactive Patient Education   2019 Elsevier Inc.  

## 2018-06-14 NOTE — Telephone Encounter (Signed)
patient called to request order be sent to [facility] for Portable Oxygen Concentrator and home oxygen. Per Dr Melford Aase, faxed dme order to [facility] 3lpm 24hr/nasal canula. Walk test done in ED 05/24/2018.

## 2018-06-15 ENCOUNTER — Other Ambulatory Visit: Payer: Self-pay | Admitting: Internal Medicine

## 2018-06-15 DIAGNOSIS — N184 Chronic kidney disease, stage 4 (severe): Secondary | ICD-10-CM

## 2018-06-15 DIAGNOSIS — J449 Chronic obstructive pulmonary disease, unspecified: Secondary | ICD-10-CM | POA: Diagnosis not present

## 2018-06-15 LAB — BASIC METABOLIC PANEL WITH GFR
BUN/Creatinine Ratio: 15 (calc) (ref 6–22)
BUN: 21 mg/dL (ref 7–25)
CO2: 29 mmol/L (ref 20–32)
Calcium: 9.5 mg/dL (ref 8.6–10.4)
Chloride: 98 mmol/L (ref 98–110)
Creat: 1.37 mg/dL — ABNORMAL HIGH (ref 0.60–0.93)
GFR, Est African American: 43 mL/min/{1.73_m2} — ABNORMAL LOW (ref >=60)
GFR, Est Non African American: 37 mL/min/{1.73_m2} — ABNORMAL LOW (ref >=60)
Glucose, Bld: 82 mg/dL (ref 65–99)
Potassium: 4.3 mmol/L (ref 3.5–5.3)
Sodium: 140 mmol/L (ref 135–146)

## 2018-06-15 MED ORDER — RIVAROXABAN 15 MG PO TABS: ORAL_TABLET | ORAL | 3 refills | Status: DC

## 2018-06-22 ENCOUNTER — Telehealth (INDEPENDENT_AMBULATORY_CARE_PROVIDER_SITE_OTHER): Payer: Self-pay | Admitting: *Deleted

## 2018-06-24 ENCOUNTER — Other Ambulatory Visit: Payer: Self-pay | Admitting: Internal Medicine

## 2018-06-24 DIAGNOSIS — J449 Chronic obstructive pulmonary disease, unspecified: Secondary | ICD-10-CM | POA: Diagnosis not present

## 2018-06-24 DIAGNOSIS — J4520 Mild intermittent asthma, uncomplicated: Secondary | ICD-10-CM

## 2018-06-29 ENCOUNTER — Ambulatory Visit (INDEPENDENT_AMBULATORY_CARE_PROVIDER_SITE_OTHER): Payer: Medicare Other | Admitting: Physical Medicine and Rehabilitation

## 2018-06-29 ENCOUNTER — Encounter (INDEPENDENT_AMBULATORY_CARE_PROVIDER_SITE_OTHER): Payer: Self-pay | Admitting: Physical Medicine and Rehabilitation

## 2018-06-29 ENCOUNTER — Other Ambulatory Visit: Payer: Self-pay

## 2018-06-29 ENCOUNTER — Telehealth (INDEPENDENT_AMBULATORY_CARE_PROVIDER_SITE_OTHER): Payer: Self-pay | Admitting: Physical Medicine and Rehabilitation

## 2018-06-29 DIAGNOSIS — G8929 Other chronic pain: Secondary | ICD-10-CM | POA: Diagnosis not present

## 2018-06-29 DIAGNOSIS — M7061 Trochanteric bursitis, right hip: Secondary | ICD-10-CM | POA: Diagnosis not present

## 2018-06-29 DIAGNOSIS — M47816 Spondylosis without myelopathy or radiculopathy, lumbar region: Secondary | ICD-10-CM | POA: Diagnosis not present

## 2018-06-29 DIAGNOSIS — M545 Low back pain, unspecified: Secondary | ICD-10-CM

## 2018-06-29 DIAGNOSIS — M7062 Trochanteric bursitis, left hip: Secondary | ICD-10-CM | POA: Diagnosis not present

## 2018-06-29 NOTE — Progress Notes (Signed)
Numeric Pain Rating Scale and Functional Assessment Average Pain 7   In the last MONTH (on 0-10 scale) has pain interfered with the following?  1. General activity like being  able to carry out your everyday physical activities such as walking, climbing stairs, carrying groceries, or moving a chair?  Rating(7)   

## 2018-06-29 NOTE — Telephone Encounter (Signed)
Notification or Prior Authorization is not required for the requested services  This [employer] Medicare Advantage members plan does not currently require a prior authorization for [phone] and [phone].

## 2018-07-04 ENCOUNTER — Encounter (INDEPENDENT_AMBULATORY_CARE_PROVIDER_SITE_OTHER): Payer: Self-pay | Admitting: Physical Medicine and Rehabilitation

## 2018-07-04 ENCOUNTER — Ambulatory Visit (INDEPENDENT_AMBULATORY_CARE_PROVIDER_SITE_OTHER): Payer: Medicare Other | Admitting: Internal Medicine

## 2018-07-04 ENCOUNTER — Other Ambulatory Visit: Payer: Self-pay

## 2018-07-04 VITALS — BP 128/72 | HR 76 | Temp 97.0°F | Resp 19 | Ht 64.0 in | Wt 211.8 lb

## 2018-07-04 DIAGNOSIS — M2550 Pain in unspecified joint: Secondary | ICD-10-CM | POA: Diagnosis not present

## 2018-07-04 DIAGNOSIS — M791 Myalgia, unspecified site: Secondary | ICD-10-CM | POA: Diagnosis not present

## 2018-07-04 DIAGNOSIS — I5042 Chronic combined systolic (congestive) and diastolic (congestive) heart failure: Secondary | ICD-10-CM | POA: Diagnosis not present

## 2018-07-04 DIAGNOSIS — Z79899 Other long term (current) drug therapy: Secondary | ICD-10-CM

## 2018-07-04 DIAGNOSIS — I482 Chronic atrial fibrillation, unspecified: Secondary | ICD-10-CM

## 2018-07-04 DIAGNOSIS — I10 Essential (primary) hypertension: Secondary | ICD-10-CM | POA: Diagnosis not present

## 2018-07-04 MED ORDER — PREDNISONE 10 MG PO TABS: ORAL_TABLET | ORAL | 0 refills | Status: DC

## 2018-07-04 MED ORDER — TRAZODONE HCL 150 MG PO TABS: ORAL_TABLET | ORAL | 0 refills | Status: DC

## 2018-07-04 NOTE — Progress Notes (Signed)
12  

## 2018-07-04 NOTE — Progress Notes (Signed)
Katie [name] - 78 y.o. female MRN [id number]  Date of birth: 1941/01/11  Virtual Visit via Telephone Note:  I connected with Katie [name] 06/29/2018 at  9:00 AM EDT by telephone and verified that I am speaking with the correct person using two identifiers.   I discussed the limitations, risks, security and privacy concerns of performing an evaluation and management service by telephone and the availability of in person appointments. I also discussed with the patient that there may be a patient responsible charge related to this service. The patient expressed understanding and agreed to proceed.  Visit Date: 06/29/2018 PCP: [name], MD Referred by: [name], MD  Subjective: Chief Complaint  Patient presents with   Lower Back - Pain   Right Hip - Pain   Left Hip - Pain   HPI: Katie [name] is a 78 y.o. female. Katie [name] is a long term patient of Dr. Marny Lowenstein in our office.  He saw her over the last several weeks with worsening axial low back pain that can refer into the lateral and somewhat anterior hips but no groin pain.  He is thoroughly examined her hips and felt like this was not an issue.  She reports a lot of pain with standing and trying to clean her house and do activities.  She gets pain with bending forward and going into extension.  Sitting helps decrease the pain pretty quickly and successfully.  She also gets relief with heat and using Tylenol.  She takes Tylenol 3 times a day.  She also takes gabapentin 4 times per day.  She wonders about using something like tramadol.  She does not take that medication currently but has taken it in the past.  She denies any radicular pain down the legs or focal weakness.  She denies any specific trauma or injury.  She has had no prior back surgery.  She is unable to have an MRI but did have an MRI of the lumbar spine performed in 2018.  At the time in 2018 she had acute encephalopathy and MRI of the brain did  show some focus of demyelination and she went on to have MRI of the cervical thoracic and lumbar spine fortunately since she cannot have that done now.  This MRI is reviewed below and mainly shows facet arthropathy at L3-4 L4-5 and L5-S1 without any stenosis of the central canal lateral recess or foramen.  She has a host of medical problems with COPD and pulmonary fibrosis and congestive heart failure and she is on Xarelto anticoagulation.  She has a history of peripheral vascular disease and some level of neuropathy.  She is on gabapentin 4 times per day.  Review of Systems  Constitutional: Positive for malaise/fatigue. Negative for chills, fever and weight loss.  HENT: Negative for hearing loss and sinus pain.   Eyes: Negative for blurred vision, double vision and photophobia.  Respiratory: Negative for cough and shortness of breath.   Cardiovascular: Negative for chest pain, palpitations and leg swelling.  Gastrointestinal: Negative for abdominal pain, nausea and vomiting.  Genitourinary: Negative for flank pain.  Musculoskeletal: Positive for back pain and joint pain. Negative for myalgias.  Skin: Negative for itching and rash.  Neurological: Negative for tremors, focal weakness and weakness.  Endo/Heme/Allergies: Negative.   Psychiatric/Behavioral: Negative for depression.  All other systems reviewed and are negative.  Otherwise per HPI.  Objective/Observation:  Patient denies any focal weakness or foot drop.  She reports inability to  walk a long distance just from increasing back pain for which she has to sit down.  She does have pain with extension of the lumbar spine.  Assessment & Plan: Visit Diagnoses:  1. Chronic bilateral low back pain without sciatica   2. Spondylosis without myelopathy or radiculopathy, lumbar region   3. Greater trochanteric bursitis, left   4. Greater trochanteric bursitis, right     Plan: Findings:  Clinically her pain is very consistent with lumbar  facet arthritis with may be some level of myofascial pain and concomitant greater trochanteric bursitis.  MRI evidence from 2018 of lumbar facet arthropathy and I doubt much is changed over the last 2 years.  There does not seem to be any significant stenosis on that MRI at her age we would have expected stenosis to show up before now although obviously things could change such as disc issues but this just does not fit from that from a clinical standpoint.  She should continue her current gabapentin and 3 times a day Tylenol.  I do not think at this point given her medical conditions and multiple medications that I feel comfortable providing her with any chronic type medication like tramadol or even hydrocodone.  She is doing fairly well other than just probably when she is moving and active.  I do think she would benefit from bilateral facet joint blocks and potential radiofrequency ablation.  I went over this at length with her from a diagnostic and hopefully therapeutic type standpoint along with radiofrequency ablation.  She does want to proceed with this when we can do it.  We will get her scheduled.  This could be done with her current level of anticoagulation.  She should contact her primary care physician if more medication management as needed.    Meds & Orders: No orders of the defined types were placed in this encounter.  No orders of the defined types were placed in this encounter.   Follow-up: Return for Bilateral L4-5 and L5-S1 medial branch blocks.  Follow Up Instructions:    I discussed the assessment and treatment plan with the patient. The patient was provided an opportunity to ask questions and all were answered. The patient agreed with the plan and demonstrated an understanding of the instructions.   The patient was advised to call back or seek an in-person evaluation if the symptoms worsen or if the condition fails to improve as anticipated.  I provided 30 minutes of  non-face-to-face time during this encounter.   Katie Spates, MD    Clinical History: MRI LUMBAR SPINE WITHOUT CONTRAST  TECHNIQUE: Multiplanar, multisequence MR imaging of the lumbar spine was performed. No intravenous contrast was administered.  COMPARISON:  CT abdomen and pelvis April 22, 2015  FINDINGS: SEGMENTATION: For the purposes of this report, the last well-formed intervertebral disc will be described as L5-S1.  ALIGNMENT: Maintenance of the lumbar lordosis. No malalignment.  VERTEBRAE:Vertebral bodies are intact. Intervertebral discs demonstrate normal morphology, decreased T2 signal within the included thoracic and lumbar discs L4-5 compatible with mild desiccation. Multilevel minimal chronic discogenic endplate changes, less than expected. No abnormal or acute bone marrow signal.  CONUS MEDULLARIS: Conus medullaris terminates at T12-L1 and demonstrates normal morphology and signal characteristics. Cauda equina is normal. Subcentimeter bilateral S2 Tarlov cysts. Subcentimeter included thoracic perineural cyst.  PARASPINAL AND SOFT TISSUES: Included prevertebral and paraspinal soft tissues are nonacute. Aortoiliac endovascular stent graft better characterized on prior CT. Mild symmetric paraspinal muscle atrophy. Heterogeneous uterus, possible leiomyomata.  DISC LEVELS:  L1-2: Annular bulging asymmetric to the RIGHT. No canal stenosis or neural foraminal narrowing.  L2-3: No disc bulge, canal stenosis nor neural foraminal narrowing. Mild facet arthropathy.  L3-4: Annular bulging. Moderate LEFT facet arthropathy and ligamentum flavum redundancy. Mild canal stenosis, no neural foraminal narrowing.  L4-5: Annular bulging. Moderate LEFT greater than RIGHT facet arthropathy and ligamentum flavum redundancy resulting in mild canal stenosis, no neural foraminal narrowing.  L5-S1: No disc bulge. Mild RIGHT, severe LEFT facet arthropathy. No canal  stenosis or neural foraminal narrowing.  IMPRESSION: Less than expected for age degenerative change of the lumbar spine. Mild canal stenosis L3-4 and L4-5. No neural foraminal narrowing.   Electronically Signed   By: Elon Alas M.D.   On: 07/14/2016 14:54   She reports that she quit smoking about 15 years ago. Her smoking use included cigarettes. She has a 108.00 pack-year smoking history. She has never used smokeless tobacco.  Recent Labs    07/05/17 1251 09/29/17 0950 01/05/18 1121 02/02/18 0921  HGBA1C 5.8* 6.0* 5.5  --   LABURIC 4.8  --  4.9 5.6    Past Medical/Family/Surgical/Social History: Medications & Allergies reviewed per EMR, new medications updated. Patient Active Problem List   Diagnosis Date Noted   Coronary artery disease due to lipid rich plaque 06/14/2018   Hypokalemia    Acute renal failure superimposed on stage 3 chronic kidney disease (HCC)    LFTs abnormal 05/23/2018   Hypercalcemia 05/17/2018   Prediabetes 02/02/2018   COPD (chronic obstructive pulmonary disease) with chronic bronchitis ([place]) 02/02/2018   Post-traumatic arthritis of ankle, left 11/29/2017   Shortness of breath    Aortic atherosclerosis ([place]) 10/17/2017   Depression 09/21/2017   Bradycardia 09/21/2017   PAH (pulmonary artery hypertension) ([place]) 06/23/2017   Esophageal dysphagia 05/15/2017   Gout 03/20/2017   Chest pain 07/07/2016   Abnormal glucose 09/19/2015   AAA (abdominal aortic aneurysm) without rupture ([place]) 04/22/2015   Morbid obesity ([place]) 08/03/2014   PVD (peripheral vascular disease) with claudication ([place]) 10/16/2013   CKD (chronic kidney disease) stage 3, GFR 30-59 ml/min ([place]) 06/08/2013   Hyperlipidemia, mixed 06/08/2013   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   Long term current use of anticoagulant therapy 04/23/2013   Vitamin D deficiency 02/15/2013   Hypothyroidism    Osteopenia    Chronic combined  systolic and diastolic CHF (congestive heart failure) ([place]) 11/25/2008   Migraine headache 11/22/2008   Essential hypertension 11/22/2008   Atherosclerosis of native coronary artery of native heart without angina pectoris 11/22/2008   Atrial fibrillation ([place]) 11/22/2008   Chronic obstructive pulmonary disease with hypoxia ([place]) 11/22/2008   Gastroesophageal reflux disease without esophagitis 11/22/2008   Past Medical History:  Diagnosis Date   AAA (abdominal aortic aneurysm) ([place])    AICD (automatic cardioverter/defibrillator) present 11/10/2017   Arthritis    "some in my knees" (11/10/2017)   Atrial fibrillation (HCC)    CHF (congestive heart failure) ([place])    Chronic bronchitis (HCC)    COPD (chronic obstructive pulmonary disease) ([place])    'they say I don't; have a problem breathing though" (11/10/2017)   Depression    GERD (gastroesophageal reflux disease)    Gout    "daily RX" (11/10/2017)   History of hiatal hernia    Hyperlipidemia    Hypertension    Hypothyroid    Migraine headache    "hx; none since 1980s" (11/10/2017)   Osteopenia    Pneumonia    "~  3 times" (11/10/2017)   PVD (peripheral vascular disease) ([place])    Family History  Problem Relation Age of Onset   Cirrhosis Mother    Cancer Mother 33       PANCREAS   Heart defect Sister    Breast cancer Sister        age 86   Heart disease Sister    Stroke Sister    Alcohol abuse Father    Depression Father    Hypertension Brother    Hyperlipidemia Son    Heart disease Daughter    Past Surgical History:  Procedure Laterality Date   ABDOMINAL AORTIC ENDOVASCULAR STENT GRAFT N/A 04/11/2013   Procedure: ABDOMINAL AORTIC ENDOVASCULAR STENT GRAFT WITH RIGHT FEMORAL PATCH ANGIOPLASTY;  Surgeon: Mal Misty, MD;  Location: [facility] OR;  Service: Vascular;  Laterality: N/A;   BIV ICD INSERTION CRT-D  11/10/2017   BIV ICD INSERTION CRT-D N/A 11/10/2017   Procedure: BIV ICD INSERTION  CRT-D;  Surgeon: Evans Lance, MD;  Location: [facility];  Service: Cardiovascular;  Laterality: N/A;   BREAST SURGERY     LEFT BREAST BIOPSY   CARDIAC CATHETERIZATION     CATARACT EXTRACTION W/ INTRAOCULAR LENS  IMPLANT, BILATERAL Bilateral    CHOLECYSTECTOMY OPEN  1972   COLONOSCOPY     EYE SURGERY Bilateral    "bleeding in my eyes"   FRACTURE SURGERY     TIBIA FRACTURE SURGERY Left 1970s   TONSILLECTOMY     VARICOSE VEIN SURGERY Bilateral    "laser"   Social History   Occupational History   Not on file  Tobacco Use   Smoking status: Former Smoker    Packs/day: 2.00    Years: 54.00    Pack years: 108.00    Types: Cigarettes    Last attempt to quit: 08/10/2002    Years since quitting: 15.9   Smokeless tobacco: Never Used  Substance and Sexual Activity   Alcohol use: Yes    Comment: 11/10/2017 "glass of wine q 3-4 months"   Drug use: Never   Sexual activity: Yes    Birth control/protection: Post-menopausal

## 2018-07-05 ENCOUNTER — Encounter: Payer: Self-pay | Admitting: Internal Medicine

## 2018-07-05 ENCOUNTER — Other Ambulatory Visit: Payer: Self-pay | Admitting: Internal Medicine

## 2018-07-05 DIAGNOSIS — M1990 Unspecified osteoarthritis, unspecified site: Secondary | ICD-10-CM

## 2018-07-05 LAB — CBC WITH DIFFERENTIAL/PLATELET
Absolute Monocytes: 1030 cells/uL — ABNORMAL HIGH (ref 200–950)
Basophils Absolute: 62 cells/uL (ref 0–200)
Basophils Relative: 0.7 %
Eosinophils Absolute: 238 cells/uL (ref 15–500)
Eosinophils Relative: 2.7 %
HCT: 41.5 % (ref 35.0–45.0)
Hemoglobin: 14.1 g/dL (ref 11.7–15.5)
Lymphs Abs: 1830 cells/uL (ref 850–3900)
MCH: 30.2 pg (ref 27.0–33.0)
MCHC: 34 g/dL (ref 32.0–36.0)
MCV: 88.9 fL (ref 80.0–100.0)
MPV: 10.7 fL (ref 7.5–12.5)
Monocytes Relative: 11.7 %
Neutro Abs: 5641 cells/uL (ref 1500–7800)
Neutrophils Relative %: 64.1 %
Platelets: 239 10*3/uL (ref 140–400)
RBC: 4.67 10*6/uL (ref 3.80–5.10)
RDW: 14.4 % (ref 11.0–15.0)
Total Lymphocyte: 20.8 %
WBC: 8.8 10*3/uL (ref 3.8–10.8)

## 2018-07-05 LAB — COMPLETE METABOLIC PANEL WITH GFR
AG Ratio: 2 (calc) (ref 1.0–2.5)
ALT: 11 U/L (ref 6–29)
AST: 17 U/L (ref 10–35)
Albumin: 4.2 g/dL (ref 3.6–5.1)
Alkaline phosphatase (APISO): 32 U/L — ABNORMAL LOW (ref 37–153)
BUN/Creatinine Ratio: 18 (calc) (ref 6–22)
BUN: 23 mg/dL (ref 7–25)
CO2: 33 mmol/L — ABNORMAL HIGH (ref 20–32)
Calcium: 9.7 mg/dL (ref 8.6–10.4)
Chloride: 95 mmol/L — ABNORMAL LOW (ref 98–110)
Creat: 1.29 mg/dL — ABNORMAL HIGH (ref 0.60–0.93)
GFR, Est African American: 47 mL/min/{1.73_m2} — ABNORMAL LOW (ref >=60)
GFR, Est Non African American: 40 mL/min/{1.73_m2} — ABNORMAL LOW (ref >=60)
Globulin: 2.1 g/dL (calc) (ref 1.9–3.7)
Glucose, Bld: 87 mg/dL (ref 65–99)
Potassium: 4.8 mmol/L (ref 3.5–5.3)
Sodium: 138 mmol/L (ref 135–146)
Total Bilirubin: 0.5 mg/dL (ref 0.2–1.2)
Total Protein: 6.3 g/dL (ref 6.1–8.1)

## 2018-07-05 LAB — C-REACTIVE PROTEIN: CRP: 25.8 mg/L — ABNORMAL HIGH (ref <8.0)

## 2018-07-05 LAB — SEDIMENTATION RATE: Sed Rate: 11 mm/h (ref 0–30)

## 2018-07-05 LAB — CK: Total CK: 47 U/L (ref 29–143)

## 2018-07-05 LAB — TSH: TSH: 1.8 mIU/L (ref 0.40–4.50)

## 2018-07-05 LAB — MAGNESIUM: Magnesium: 2.1 mg/dL (ref 1.5–2.5)

## 2018-07-05 LAB — CYCLIC CITRUL PEPTIDE ANTIBODY, IGG: Cyclic Citrullin Peptide Ab: >250 UNITS — ABNORMAL HIGH

## 2018-07-05 LAB — ANTI-SMITH ANTIBODY: ENA SM Ab Ser-aCnc: <1 AI

## 2018-07-05 LAB — URIC ACID: Uric Acid, Serum: 5.3 mg/dL (ref 2.5–7.0)

## 2018-07-05 NOTE — Progress Notes (Addendum)
ADULT & ADOLESCENT INTERNAL MEDICINE   [name], M.D.    Katie [name]. Silverio Lay, P.A.-C      Liane [address], [address]                [place] 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  Medications  .  levothyroxine 100 MCG tablet, Take 1 tablet daily  .   Start predniSONE  10 MG tab, 1 tab 3 x day-3 days, 1 tab 2 x day-3 days,  1 tab 1 x day-5 days  .  digoxin  0.125 MG tablet, Take 1 tablet ( daily. .  isosorbide mononitrate 30 MG , TAKE 1 TABLET DAILY  .  metolazone  2.5 MG tablet, TAKE 1 TABLET  NEEDED AS DIRECTED .  metoprolol succ-XL 25 MG , Take 3 tablets  daily .  pravastatin  40 MG tablet, TAKE 1/2 TABLET  AT BEDTIME  .  torsemide 20 MG tablet, Take 2 tablets  2  times daily.  Marland Kitchen  albuterol  HFA  inhaler, 2 PUFFS 10-15 MINS APART EVERY 4 HRS AS NEEDED  .  cetirizine 10 MG , Take 10 mg  as needed  .  guaiFENesin  600 MG 12 hr , Take 1 tablet daily as needed .  ipratropium 0.06 % nasal spray, 1-2 sprays each Nostril 2-3 x / day as needed  .  montelukast 10 MG , Take 1 tablet daily  .  acetaminophen 500 MG , Take 1,000 mg  every 6  hrs as needed .  Allopurinol 300 MG , TAKE 1 TABLET EVERY DAY  .  aspirin EC 81 MG tablet, Take  daily.     XARELTO 15 MG , Take 1 tablet Daily for A fib  .  VIT D3 5000 units , Take 5,000 Units  daily.  .  Eyelid Cleansers AVENOVA 0.01 % SOLN, Apply 1 application topically  .  gabapentin  600 MG , Take 600 mg  3  times  daily. .  Pantoprazole 40 MG Take 1 tablet  daily. Carren Rang, Place 1 drop into both eyes 4 times daily as needed  .  potassium chloride 20 MEQ tablet, TAKE 2 TABLETS  DAILY . PRED FORTE 1 % ophth susp, Place 1 drop  both eyes 2  times daily.  .  prochlorperazine  5 MG tablet, TAKE 1 TABLET  THREE TIMES A DAY AS NEEDED FOR VERTIGO OR NAUSEA (Patient taking differently: Take 5 mg by mouth daily as needed for nausea (vertigo). ) .  triamcinolone cream (KENALOG) 0.1 %, Apply 1 application topically daily.  .  traZODone (DESYREL) 150 MG tablet, Take 1/2 to 1 tablet 1 hour before sleep  Problem list She has Migraine headache; Essential hypertension; Atherosclerosis of native coronary artery of native heart without angina pectoris; Atrial fibrillation ([place]); Chronic combined systolic and diastolic CHF (congestive heart failure) ([place]); Chronic obstructive pulmonary disease with hypoxia ([place]); Gastroesophageal reflux disease without esophagitis;  Osteopenia; Hypothyroidism; Vitamin D deficiency; Long term current use of anticoagulant therapy; CKD (chronic kidney disease) stage 3, GFR 30-59 ml/min (HCC); Hyperlipidemia, mixed; Pulmonary Fibrosis sequellae of Amiodarone; PVD (peripheral vascular disease) with claudication ([place]); Morbid obesity ([place]); AAA (abdominal aortic aneurysm) without rupture ([place]); Abnormal glucose; Chest pain; Gout; Esophageal dysphagia; PAH (pulmonary artery hypertension) ([place]); Depression; Bradycardia; Aortic atherosclerosis ([place]); Shortness of breath; Post-traumatic arthritis of ankle, left; Prediabetes; COPD (chronic obstructive pulmonary disease) with chronic bronchitis ([place]); Hypercalcemia; LFTs abnormal; Hypokalemia; Acute renal failure superimposed on stage 3 chronic kidney disease ([place]); and Coronary artery disease due to lipid rich plaque on their problem list.   Observations/Objective:  BP 128/72   Pulse 76   Temp (!) 97 F (36.1 C)   Resp 19   Ht 5\' 4"  (1.626 m)   Wt 211  lb 12.8 oz (96.1 kg)   SpO2 97%   BMI 36.36 kg/m    HEENT - WNL. Neck - supple.  Chest - Clear equal BS. Cor - Nl HS. RRR w/o sig MGR. PP 1(+). No edema. MS- patient is tender over fingers/hands /wrists suspect for synovitis.  Gait Nl. Neuro -  Nl w/o focal abnormalities. Skin- No Rash  Assessment and Plan:  1. Essential hypertension  - CBC with Differential/Platelet - COMPLETE METABOLIC PANEL WITH GFR - Magnesium - TSH  2. Arthralgias r/o Inflammatory Arthritis  - Uric acid - Sedimentation rate - C-reactive protein - Cyclic citrul peptide antibody, IgG - Anti-Smith antibody - predniSONE  10 MG tablet; 1 tab 3 x day for 3 days, then 1 tab 2 x day for 3 days, then 1 tab 1 x day for 5 days  Dispense: 20 tablet; Refill: 0  3. Myalgia  - Sedimentation rate - C-reactive protein - CK  - predniSONE  10 MG tablet; 1 tab 3 x day for 3 days, then 1 tab 2 x day for 3 days, then 1 tab 1 x day for 5 days  Dispense: 20 tablet; Refill: 0  4. Chronic combined systolic and diastolic CHF (congestive heart failure) (HCC)  - COMPLETE METABOLIC PANEL WITH GFR  5. Chronic atrial fibrillation  - COMPLETE METABOLIC PANEL WITH GFR  6. Medication management  - CBC with Differential/Platelet - COMPLETE METABOLIC PANEL WITH GFR - Magnesium - TSH  Follow Up Instructions:     I discussed the assessment and treatment plan with the patient. The patient was provided an opportunity to ask questions and all were answered. The patient agreed with the plan and demonstrated an understanding of the instructions.   Kirtland Bouchard, MD

## 2018-07-10 ENCOUNTER — Other Ambulatory Visit (INDEPENDENT_AMBULATORY_CARE_PROVIDER_SITE_OTHER): Payer: Self-pay

## 2018-07-10 ENCOUNTER — Encounter (INDEPENDENT_AMBULATORY_CARE_PROVIDER_SITE_OTHER): Payer: Self-pay | Admitting: Orthopaedic Surgery

## 2018-07-10 ENCOUNTER — Ambulatory Visit (INDEPENDENT_AMBULATORY_CARE_PROVIDER_SITE_OTHER): Payer: Medicare Other | Admitting: Orthopaedic Surgery

## 2018-07-10 ENCOUNTER — Other Ambulatory Visit: Payer: Self-pay

## 2018-07-10 ENCOUNTER — Ambulatory Visit (INDEPENDENT_AMBULATORY_CARE_PROVIDER_SITE_OTHER): Payer: Self-pay

## 2018-07-10 DIAGNOSIS — M79674 Pain in right toe(s): Secondary | ICD-10-CM

## 2018-07-10 DIAGNOSIS — M19072 Primary osteoarthritis, left ankle and foot: Secondary | ICD-10-CM

## 2018-07-10 MED ORDER — LIDOCAINE HCL 1 % IJ SOLN
2.0000 mL | INTRAMUSCULAR | Status: AC | PRN
  Administered 2018-07-10: 2 mL

## 2018-07-10 MED ORDER — METHYLPREDNISOLONE ACETATE 40 MG/ML IJ SUSP
40.0000 mg | INTRAMUSCULAR | Status: AC | PRN
  Administered 2018-07-10: 40 mg via INTRA_ARTICULAR

## 2018-07-10 NOTE — Progress Notes (Signed)
Office Visit Note   Patient: Katie [name]           Date of Birth: Aug 30, 1940           MRN: [id number] Visit Date: 07/10/2018              Requested by: [name], [address] [place], [place] [address] PCP: [name], MD   Assessment & Plan: Visit Diagnoses:  1. Great toe pain, right   2. 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  See her back in 6 weeks obtain 3 views of the right great toe at that time.  In regards to the left ankle she is activities as tolerated.  Follow-up with [place] in 3 months for repeat injection in the ankle but no sooner than that.  Questions were encouraged and answered.  Follow-Up Instructions: Return in about 6 weeks (around 08/21/2018).   Orders:  Orders Placed This Encounter  Procedures  . Medium Joint Inj  . XR Toe Great Right   No orders of the defined types were placed in this encounter.     Procedures: Medium Joint Inj: L ankle on 07/10/2018 10:09 AM Details: 25 G 1.5 in needle, anterolateral approach Medications: 2 mL lidocaine 1 %; 40 mg methylPREDNISolone acetate 40 MG/ML Consent was given by the patient. Immediately prior to procedure a time out was called to verify the correct patient, procedure, equipment, support staff and site/side marked as required. Patient was prepped and draped in the usual sterile fashion.       Clinical Data: No additional findings.   Subjective: Chief Complaint  Patient presents with  . Right Great Toe - Pain    HPI Mrs. Sneed is well-known to our department service comes in today for right great toe pain.  She again she sustained a fracture on 04/02/2018 has been treated conservatively.  She states that toe is to the point that it is waking her up at night.  She is having severe pain in the toe at all times.  She also has known left ankle arthritis and is wanting  another injection in the ankle last injection was back in December.  She said no new injury to the left ankle.  Review of Systems  Constitutional: Negative for chills and fever.     Objective: Vital Signs: There were no vitals taken for this visit.  Physical Exam Constitutional:      Appearance: She is not ill-appearing or diaphoretic.  Pulmonary:     Effort: Pulmonary effort is normal.  Neurological:     Mental Status: She is alert and oriented to person, place, and time.     Ortho Exam Physical exam: Right great toe tenderness over the proximal phalanx.  Nontender over the distal.  Toes stiff through the IP joint MTP joint. Left ankle: Stiffness with attempts at range of motion.   Tenderness globally.  No rashes skin lesions ulcerations.  Specialty Comments:  No specialty comments available.  Imaging: Xr Toe Great Right  Result Date: 07/10/2018 Right great toe 3 views: Proximal phalanx fracture remains in overall good position alignment.  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  No other fractures identified no other bony abnormalities.    PMFS History: Patient Active Problem List   Diagnosis Date Noted  . Coronary artery disease due to lipid rich plaque 06/14/2018  .  Acute renal failure superimposed on stage 3 chronic kidney disease ([place])   . Prediabetes 02/02/2018  . COPD (chronic obstructive pulmonary disease) with chronic bronchitis ([place]) 02/02/2018  . Aortic atherosclerosis ([place]) 10/17/2017  . Depression 09/21/2017  . Bradycardia 09/21/2017  . PAH (pulmonary artery hypertension) ([place]) 06/23/2017  . Esophageal dysphagia 05/15/2017  . Gout 03/20/2017  . Abnormal glucose 09/19/2015  . AAA (abdominal aortic aneurysm) without rupture ([place]) 04/22/2015  . Morbid obesity ([place]) 08/03/2014  . PVD (peripheral vascular disease) with claudication ([place]) 10/16/2013  . CKD (chronic kidney disease) stage 3, GFR 30-59 ml/min (HCC) 06/08/2013  . Hyperlipidemia,  mixed 06/08/2013  . 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 Long term current use of anticoagulant therapy 04/23/2013  . Vitamin D deficiency 02/15/2013  . Hypothyroidism   . Osteopenia   . Chronic combined systolic and diastolic CHF (congestive heart failure) ([employer] [facility]) 11/25/2008  . Migraine headache 11/22/2008  . Essential hypertension 11/22/2008  . Atherosclerosis of native coronary artery of native heart without angina pectoris 11/22/2008  . Atrial fibrillation ([place]) 11/22/2008  . Chronic obstructive pulmonary disease with hypoxia ([place]) 11/22/2008  . Gastroesophageal reflux disease without esophagitis 11/22/2008   Past Medical History:  Diagnosis Date  . AAA (abdominal aortic aneurysm) ([place])   . AICD (automatic cardioverter/defibrillator) present 11/10/2017  . Arthritis    "some in my knees" (11/10/2017)  . Atrial fibrillation ([place])   . CHF (congestive heart failure) ([place])   . Chronic bronchitis ([place])   . COPD (chronic obstructive pulmonary disease) ([place])    'they say I don't; have a problem breathing though" (11/10/2017)  . Depression   . GERD (gastroesophageal reflux disease)   . Gout    "daily RX" (11/10/2017)  . History of hiatal hernia   . Hyperlipidemia   . Hypertension   . Hypothyroid   . Migraine headache    "hx; none since 1980s" (11/10/2017)  . Osteopenia   . Pneumonia    "~ 3 times" (11/10/2017)  . PVD (peripheral vascular disease) (HCC)     Family History  Problem Relation Age of Onset  . Cirrhosis Mother   . Cancer Mother 26       PANCREAS  . Heart defect Sister   . Breast cancer Sister        age 19  . Heart disease Sister   . Stroke Sister   . Alcohol abuse Father   . Depression Father   . Hypertension Brother   . Hyperlipidemia Son   . Heart disease Daughter     Past Surgical History:  Procedure Laterality Date  . ABDOMINAL AORTIC ENDOVASCULAR STENT GRAFT N/A 04/11/2013   Procedure: ABDOMINAL AORTIC ENDOVASCULAR STENT GRAFT WITH  RIGHT FEMORAL PATCH ANGIOPLASTY;  Surgeon: Mal Misty, MD;  Location: [facility];  Service: Vascular;  Laterality: N/A;  . BIV ICD INSERTION CRT-D  11/10/2017  . BIV ICD INSERTION CRT-D N/A 11/10/2017   Procedure: BIV ICD INSERTION CRT-D;  Surgeon: Evans Lance, MD;  Location: [facility];  Service: Cardiovascular;  Laterality: N/A;  . BREAST SURGERY     LEFT BREAST BIOPSY  . CARDIAC CATHETERIZATION    . CATARACT EXTRACTION W/ INTRAOCULAR LENS  IMPLANT, BILATERAL Bilateral   . CHOLECYSTECTOMY OPEN  1972  . COLONOSCOPY    . EYE SURGERY Bilateral    "bleeding in my eyes"  . FRACTURE SURGERY    . TIBIA FRACTURE SURGERY Left 1970s  . TONSILLECTOMY    . VARICOSE VEIN  SURGERY Bilateral    "laser"   Social History   Occupational History  . Not on file  Tobacco Use  . Smoking status: Former Smoker    Packs/day: 2.00    Years: 54.00    Pack years: 108.00    Types: Cigarettes    Last attempt to quit: 08/10/2002    Years since quitting: 15.9  . Smokeless tobacco: Never Used  Substance and Sexual Activity  . Alcohol use: Yes    Comment: 11/10/2017 "glass of wine q 3-4 months"  . Drug use: Never  . Sexual activity: Yes    Birth control/protection: Post-menopausal

## 2018-07-12 ENCOUNTER — Other Ambulatory Visit: Payer: Self-pay | Admitting: Internal Medicine

## 2018-07-12 DIAGNOSIS — M069 Rheumatoid arthritis, unspecified: Secondary | ICD-10-CM | POA: Diagnosis not present

## 2018-07-12 DIAGNOSIS — M549 Dorsalgia, unspecified: Secondary | ICD-10-CM | POA: Diagnosis not present

## 2018-07-12 DIAGNOSIS — M79642 Pain in left hand: Secondary | ICD-10-CM | POA: Diagnosis not present

## 2018-07-12 DIAGNOSIS — I5042 Chronic combined systolic (congestive) and diastolic (congestive) heart failure: Secondary | ICD-10-CM

## 2018-07-12 DIAGNOSIS — I10 Essential (primary) hypertension: Secondary | ICD-10-CM | POA: Diagnosis not present

## 2018-07-12 DIAGNOSIS — M25561 Pain in right knee: Secondary | ICD-10-CM | POA: Diagnosis not present

## 2018-07-12 DIAGNOSIS — M130 Polyarthritis, unspecified: Secondary | ICD-10-CM | POA: Diagnosis not present

## 2018-07-12 DIAGNOSIS — M25551 Pain in right hip: Secondary | ICD-10-CM | POA: Diagnosis not present

## 2018-07-12 DIAGNOSIS — M79641 Pain in right hand: Secondary | ICD-10-CM | POA: Diagnosis not present

## 2018-07-12 DIAGNOSIS — M1990 Unspecified osteoarthritis, unspecified site: Secondary | ICD-10-CM | POA: Diagnosis not present

## 2018-07-13 NOTE — Progress Notes (Signed)
MEDICARE ANNUAL WELLNESS VISIT AND FOLLOW UP  THIS ENCOUNTER IS A VIRTUAL/TELEPHONE VISIT DUE TO COVID-19 - PATIENT WAS NOT SEEN IN THE OFFICE.  PATIENT HAS CONSENTED TO VIRTUAL VISIT / TELEMEDICINE VISIT  This provider placed a call to Katie [name] using telephone, her appointment was changed to a virtual office visit to reduce the risk of exposure to the COVID-19 virus and to help Katie [name] remain healthy and safe. The virtual visit will also provide continuity of care. She verbalizes understanding.   Assessment:    Comprehensive care plan:  Patient/caregiver was given comprehensive care plan We will continue to monitor these goals every 3 months with an office visit and every month by a telephone call  Patient can contact the office any time with the phone number and get to a provider via the answering service or they can use Mychart.   Verbal permission was received from the patient to review comprehensive care management, they understand they have the right to stop CCM services at any time.   The patient is self managing medications at home.  Medications were reviewed with the patient today as well as adherence and potential interactions.   The patient does not need home health services at this time.    Encounter for Medicare annual wellness exam 1 year  Permanent atrial fibrillation Continue cardio follow up  Chronic combined systolic and diastolic CHF (congestive heart failure) ([place]) Continue cardio follow up CHF Disease process and medications discussed. Questions answered fully. Emphasized salt restriction, less than 2000mg  a day. Encouraged daily monitoring of the patient's weight, call office if 5 lb weight loss or gain in a day.  Encouraged regular exercise. If any increasing shortness of breath, swelling, or chest pressure go to ER immediately.  decrease your fluid intake to less than 2 L daily please remember to always increase your potassium intake with  any increase of your fluid pill.   PVD (peripheral vascular disease) with claudication (HCC) Control blood pressure, cholesterol, glucose, increase exercise.   AAA (abdominal aortic aneurysm) without rupture (HCC) Control blood pressure, cholesterol, glucose, increase exercise.   PAH (pulmonary artery hypertension) ([place]) Continue follow up cardiology  Aortic atherosclerosis ([place])  Chronic obstructive pulmonary disease with hypoxia (HCC) No triggers, well controlled symptoms, cont to monitor  COPD (chronic obstructive pulmonary disease) with chronic bronchitis (HCC) No triggers, well controlled symptoms, cont to monitor  CKD (chronic kidney disease) stage 3, GFR 30-59 ml/min (HCC) Increase fluids, avoid NSAIDS, monitor sugars, will monitor  Morbid obesity ([place]) - follow up 3 months for progress monitoring - increase veggies, decrease carbs - long discussion about weight loss, diet, and exercise  Major depressive disorder in partial remission, unspecified whether recurrent ([place]) - continue medications, stress management techniques discussed, increase water, good sleep hygiene discussed, increase exercise, and increase veggies.   Migraine without status migrainosus, not intractable, unspecified migraine type Monitor triggers, controlled  Essential hypertension - continue medications, DASH diet, exercise and monitor at home. Call if greater than 130/80.   Coronary artery disease due to lipid rich plaque Control blood pressure, cholesterol, glucose, increase exercise.   Pulmonary Fibrosis sequellae of Amiodarone Continue follow up pulmonary  Gastroesophageal reflux disease without esophagitis Continue PPI/H2 blocker, diet discussed  Esophageal dysphagia Continue PPI/H2 blocker, diet discussed  Hypothyroidism, unspecified type Hypothyroidism-check TSH level, continue medications the same, reminded to take on an empty stomach 30-14mins before food.   Osteopenia,  unspecified location Monitor  Hyperlipidemia, mixed check lipids decrease fatty  foods increase activity.   Atherosclerosis of native coronary artery of native heart without angina pectoris Control blood pressure, cholesterol, glucose, increase exercise.   Long term current use of anticoagulant therapy On xarelto  Vitamin D deficiency Continue supplement  Abnormal glucose Discussed disease progression and risks Discussed diet/exercise, weight management and risk modification Continue to cut sugars and do well  Gout, unspecified cause, unspecified chronicity, unspecified site Gout- recheck Uric acid as needed, Diet discussed, continue medications.  Advanced care planning Discussed advanced care planning with patient and/or family At least 30 mins discussed with the patient and/or family at the visit.     Over 40 minutes of exam, counseling, chart review and critical decision making was performed Future Appointments  Date Time Provider [place]  08/09/2018 11:00 AM Magnus Sinning, MD [facility] None  08/14/2018  7:00 AM [facility] DEVICE REMOTES [facility]  08/21/2018  9:00 AM Mcarthur Rossetti, MD [facility] None  08/23/2018  2:00 PM Liane Comber, NP [facility] None  02/27/2019  9:00 AM [name], MD [facility] None     Plan:   During the course of the visit the patient was educated and counseled about appropriate screening and preventive services including:    Pneumococcal vaccine   Prevnar 13  Influenza vaccine  Td vaccine  Screening electrocardiogram  Bone densitometry screening  Colorectal cancer screening  Diabetes screening  Glaucoma screening  Nutrition counseling   Advanced directives: requested   Subjective:  Katie [name] is a 78 y.o. female who presents for Medicare Annual Wellness Visit.   Patient has hx/o Chronic Afib since 1999. She is on xarelto. 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 In 2014, Lexiscan showed EF 50%. She had an endovascular graft placed in 2015 and is followed by vascular. 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   BMI is Body mass index is 33.3 kg/m., she is working on diet and exercise. She is on demadex 40mg  BID and she will take metolazone AS needed, normally it is once a week and she will take extra potassium.  Wt Readings from Last 3 Encounters:  07/14/18 194 lb (88 kg)  07/04/18 211 lb 12.8 oz (96.1 kg)  06/14/18 210 lb 6.4 oz (95.4 kg)    Most recently she had a + CCP/CRP and is following with rheumatology, saw them Wednesday pending results.   Her blood pressure has been controlled at home, today their BP is BP: 122/74   She has a history of Combined Systolic and Diastolic, denies orthopnea and paroxysmal nocturnal dyspnea. Positive for dyspnea and lower extremity edema despite recent increase of lasix to 160 mg daily. BMI is Body mass index is 33.3 kg/m., she is working on diet and exercise. She is eating more salads, she is doing her biPAP, she is cutting back on carbs. She is still drinking sweet tea but cutting back on the portion.  Wt Readings from Last 3 Encounters:  07/14/18 194 lb (88 kg)  07/04/18 211 lb 12.8 oz (96.1 kg)  06/14/18 210 lb 6.4 oz (95.4 kg)   Her blood pressure has been controlled at home, today their BP is BP: 122/74  She does not workout. She denies chest pain, shortness of breath, dizziness.  She is on cholesterol medication and denies myalgias. Her cholesterol is at goal. The cholesterol last visit was:   Lab Results  Component Value Date   CHOL 169 05/16/2018   HDL  31 (L) 05/16/2018   LDLCALC 96 05/16/2018   TRIG 315 (H) 05/16/2018   CHOLHDL 5.5 (H) 05/16/2018     Last A1C in the office was:  Lab Results  Component Value Date   HGBA1C 5.5 01/05/2018   Patient is on Vitamin D supplement.   Lab Results  Component  Value Date   VD25OH 66 01/05/2018      Medication Review: Current Outpatient Medications on File Prior to Visit  Medication Sig Dispense Refill  . acetaminophen (TYLENOL) 500 MG tablet Take 1,000 mg by mouth every 6 (six) hours as needed for moderate pain.     Marland Kitchen albuterol (PROAIR HFA) 108 (90 Base) MCG/ACT inhaler INHALE 2 PUFFS INTO THE LUNGS 10 TO 15 MINS APART EVERY 4 HOURS AS NEEDED FOR ASTHMA FLARE 42.5 Inhaler 3  . allopurinol (ZYLOPRIM) 300 MG tablet TAKE 1 TABLET BY MOUTH EVERY DAY (Patient taking differently: Take 300 mg by mouth daily. ) 90 tablet 1  . aspirin EC 81 MG tablet Take 81 mg by mouth daily.    . cetirizine (ZYRTEC) 10 MG tablet Take 10 mg by mouth as needed for allergies.     . Cholecalciferol (VITAMIN D3) 5000 units CAPS Take 5,000 Units by mouth daily.     . digoxin (LANOXIN) 0.125 MG tablet Take 1 tablet (0.125 mg total) by mouth daily. 90 tablet 1  . Eyelid Cleansers (AVENOVA) 0.01 % SOLN Apply 1 application topically See admin instructions. Wash eyelids twice daily with cleaner  99  . gabapentin (NEURONTIN) 600 MG tablet Take 600 mg by mouth 3 (three) times daily.    Marland Kitchen guaiFENesin (MUCINEX) 600 MG 12 hr tablet Take 1 tablet by mouth daily as needed (runny nose).     Marland Kitchen ipratropium (ATROVENT) 0.06 % nasal spray Use 1 to 2 sprays each Nostril 2 to 3 x / day as needed (Patient taking differently: Place 1-2 sprays into both nostrils 3 (three) times daily as needed for rhinitis. ) 45 mL 3  . isosorbide mononitrate (IMDUR) 30 MG 24 hr tablet TAKE 1 TABLET DAILY FOR BP & HEART (Patient taking differently: Take 30 mg by mouth daily. ) 90 tablet 1  . levothyroxine (SYNTHROID) 100 MCG tablet Take 1 tablet daily on an empty stomach with only water for 30 minutes. (Patient taking differently: Take 100 mcg by mouth daily before breakfast. ) 90 tablet 1  . metolazone (ZAROXOLYN) 2.5 MG tablet TAKE 1 TABLET BY MOUTH 30 MINS PRIOR TO FLUID PILL AS NEEDED AS DIRECTED 30 tablet 0  .  metoprolol succinate (TOPROL-XL) 25 MG 24 hr tablet Take 3 tablets (75 mg total) by mouth daily. Take with or immediately following a meal. 90 tablet 0  . montelukast (SINGULAIR) 10 MG tablet Take 1 tablet daily for Allergies & Asthma (Patient taking differently: Take 10 mg by mouth at bedtime. ) 90 tablet 1  . pantoprazole (PROTONIX) 40 MG tablet Take 1 tablet (40 mg total) by mouth daily.    [name] Glycol-Propyl Glycol (SYSTANE OP) Place 1 drop into both eyes 4 (four) times daily as needed (dry eyes).     . potassium chloride SA (KLOR-CON M20) 20 MEQ tablet TAKE 2 TABLETS BY MOUTH DAILY 180 tablet 1  . pravastatin (PRAVACHOL) 40 MG tablet TAKE 1/2 TABLET BY MOUTH AT BEDTIME FOR CHOLESTEROL 45 tablet 3  . prednisoLONE acetate (PRED FORTE) 1 % ophthalmic suspension Place 1 drop into both eyes 2 (two) times daily.   1  .  predniSONE (DELTASONE) 10 MG tablet 1 tab 3 x day for 3 days, then 1 tab 2 x day for 3 days, then 1 tab 1 x day for 5 days 20 tablet 0  . prochlorperazine (COMPAZINE) 5 MG tablet TAKE 1 TABLET BY MOUTH THREE TIMES A DAY AS NEEDED FOR VERTIGO OR NAUSEA (Patient taking differently: Take 5 mg by mouth daily as needed for nausea (vertigo). ) 60 tablet 1  . Rivaroxaban (XARELTO) 15 MG TABS tablet Take 1 tablet Daily for A fib 90 tablet 3  . torsemide (DEMADEX) 20 MG tablet Take 2 tablets (40 mg total) by mouth 2 (two) times daily.    . traZODone (DESYREL) 150 MG tablet Take 1/2 to 1 tablet 1 hour before sleep 90 tablet 0  . triamcinolone cream (KENALOG) 0.1 % Apply 1 application topically daily.      No current facility-administered medications on file prior to visit.     Allergies  Allergen Reactions  . Amiodarone Other (See Comments)    PULMONARY TOXICITY  . Diovan [Valsartan] Other (See Comments)    HYPOTENSION  . Doxycycline Diarrhea and Other (See Comments)    VISUAL DISTURBANCE  . Flexeril [Cyclobenzaprine] Other (See Comments)    FATIGUE  . Keflex [Cephalexin]  Diarrhea  . Verapamil Other (See Comments)    EDEMA  . Codeine Hives    Current Problems (verified) Patient Active Problem List   Diagnosis Date Noted  . Coronary artery disease due to lipid rich plaque 06/14/2018  . COPD (chronic obstructive pulmonary disease) with chronic bronchitis ([place]) 02/02/2018  . Aortic atherosclerosis ([place]) 10/17/2017  . Depression 09/21/2017  . Bradycardia 09/21/2017  . PAH (pulmonary artery hypertension) ([place]) 06/23/2017  . Esophageal dysphagia 05/15/2017  . Gout 03/20/2017  . Abnormal glucose 09/19/2015  . AAA (abdominal aortic aneurysm) without rupture ([place]) 04/22/2015  . Morbid obesity ([place]) 08/03/2014  . PVD (peripheral vascular disease) with claudication ([place]) 10/16/2013  . CKD (chronic kidney disease) stage 3, GFR 30-59 ml/min (HCC) 06/08/2013  . Hyperlipidemia, mixed 06/08/2013  . 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 Long term current use of anticoagulant therapy 04/23/2013  . Vitamin D deficiency 02/15/2013  . Hypothyroidism   . Osteopenia   . Chronic combined systolic and diastolic CHF (congestive heart failure) ([place]) 11/25/2008  . Migraine headache 11/22/2008  . Essential hypertension 11/22/2008  . Atherosclerosis of native coronary artery of native heart without angina pectoris 11/22/2008  . Atrial fibrillation ([place]) 11/22/2008  . Chronic obstructive pulmonary disease with hypoxia ([place]) 11/22/2008  . Gastroesophageal reflux disease without esophagitis 11/22/2008    Screening Tests Immunization History  Administered Date(s) Administered  . Influenza, High Dose Seasonal PF 12/20/2013, 12/09/2014, 11/14/2015, 01/10/2017, 12/02/2017  . Influenza-Unspecified 01/02/2013  . PPD Test 07/17/2016  . Pneumococcal Conjugate-13 01/23/2014  . Pneumococcal-Unspecified 03/15/1993, 05/31/2008  . Td 03/16/2007  . Tdap 08/11/2017  . Varicella 02/19/2008   Preventative care: Last colonoscopy: Normal in 2014, declines any further  colonoscopy Last mammogram: 02/2018 DEXA: 02/2018 normal -0.8 PFTs 02/2016 MRI 2018 Stress test 2014 Echo 2019  Prior vaccinations: TD or Tdap: 2019   Influenza: 2019  Pneumococcal: 2010 Prevnar13: 2015 Shingles/Zostavax: 2009  Names of Other Physician/Practitioners you currently use: 1. [facility]- here for primary care 2. Dr. Patrice Paradise, eye doctor, last visit  2019 every 4 month 3. [facility], dentist, last visit remotely   Patient Care Team: [name], MD as PCP - General (Internal Medicine) Evans Lance, MD  as PCP - Electrophysiology (Cardiology) Evans Lance, MD as Consulting Physician (Cardiology) Georgeann Oppenheim, MD as Consulting Physician (Ophthalmology) Terrance Mass, MD (Inactive) as Consulting Physician (Gynecology) Erle Crocker, MD as Consulting Physician (Orthopedic Surgery) Tanda Rockers, MD as Consulting Physician (Pulmonary Disease)  SURGICAL HISTORY She  has a past surgical history that includes Colonoscopy; Tonsillectomy; Abdominal aortic endovascular stent graft (N/A, 04/11/2013); Breast surgery; BIV ICD INSERTION CRT-D (11/10/2017); Cholecystectomy open (1972); Fracture surgery; Tibia fracture surgery (Left, 1970s); Eye surgery (Bilateral); Cataract extraction w/ intraocular lens  implant, bilateral (Bilateral); Varicose vein surgery (Bilateral); Cardiac catheterization; and BIV ICD INSERTION CRT-D (N/A, 11/10/2017). FAMILY HISTORY Her family history includes Alcohol abuse in her father; Breast cancer in her sister; Cancer (age of onset: 41) in her mother; Cirrhosis in her mother; Depression in her father; Heart defect in her sister; Heart disease in her daughter and sister; Hyperlipidemia in her son; Hypertension in her brother; Stroke in her sister. SOCIAL HISTORY She  reports that she quit smoking about 15 years ago. Her smoking use included cigarettes. She has a 108.00 pack-year smoking history. She has  never used smokeless tobacco. She reports current alcohol use. She reports that she does not use drugs.   MEDICARE WELLNESS OBJECTIVES: Physical activity: Current Exercise Habits: The patient does not participate in regular exercise at present(she does yard work and house work), Exercise limited by: orthopedic condition(s);respiratory conditions(s) Cardiac risk factors: Cardiac Risk Factors include: advanced age (>63men, >68 women);dyslipidemia;hypertension;obesity (BMI >30kg/m2);sedentary lifestyle Depression/mood screen:   Depression screen [facility] 2/9 07/14/2018  Decreased Interest 0  Down, Depressed, Hopeless 0  PHQ - 2 Score 0  Some recent data might be hidden    ADLs:  In your present state of health, do you have any difficulty performing the following activities: 07/14/2018 07/08/2018  Hearing? N N  Vision? N N  Difficulty concentrating or making decisions? N N  Walking or climbing stairs? Y N  Comment due to her back or ankle occ -  Dressing or bathing? N N  Doing errands, shopping? N N  Some recent data might be hidden     Cognitive Testing  Alert? Yes  Normal Appearance?Yes  Oriented to person? Yes  Place? Yes   Time? Yes  Recall of three objects?  Yes  Can perform simple calculations? Yes  Displays appropriate judgment?Yes  Can read the correct time from a watch face?Yes  EOL planning: Does Patient Have a Medical Advance Directive?: No Would patient like information on creating a medical advance directive?: No - Guardian declined  Review of Systems  Constitutional: Negative for malaise/fatigue and weight loss.  HENT: Negative for hearing loss and tinnitus.   Eyes: Negative for blurred vision and double vision.  Respiratory: Positive for shortness of breath (With exertion only, at baselin). Negative for cough, sputum production and wheezing.   Cardiovascular: Positive for leg swelling. Negative for chest pain, palpitations, orthopnea, claudication and PND.   Gastrointestinal: Negative for abdominal pain, blood in stool, constipation, diarrhea, heartburn, melena, nausea and vomiting.  Genitourinary: Negative.   Musculoskeletal: Negative for falls, joint pain and myalgias.  Skin: Negative for rash.  Neurological: Negative for dizziness, tingling, sensory change, weakness and headaches.  Endo/Heme/Allergies: Negative for polydipsia.  Psychiatric/Behavioral: Negative.  Negative for depression, memory loss, substance abuse and suicidal ideas. The patient is not nervous/anxious and does not have insomnia.   All other systems reviewed and are negative.    Objective:     Today's Vitals  07/14/18 0845  BP: 122/74  Pulse: 92  SpO2: 97%  Weight: 194 lb (88 kg)  Height: 5\' 4"  (1.626 m)   Body mass index is 33.3 kg/m.  General Appearance:Well sounding, in no apparent distress.  ENT/Mouth: No hoarseness, No cough for duration of visit.  Respiratory: completing full sentences without distress, without audible wheeze Neuro: Awake and oriented X 3,  Psych:  Insight and Judgment appropriate.    Medicare Attestation I have personally reviewed: The patient's medical and social history Their use of alcohol, tobacco or illicit drugs Their current medications and supplements The patient's functional ability including ADLs,fall risks, home safety risks, cognitive, and hearing and visual impairment Diet and physical activities Evidence for depression or mood disorders  The patient's weight, height, BMI, and visual acuity have been recorded in the chart.  I have made referrals, counseling, and provided education to the patient based on review of the above and I have provided the patient with a written personalized care plan for preventive services.     Vicie Mutters, PA-C   07/14/2018

## 2018-07-14 ENCOUNTER — Encounter: Payer: Self-pay | Admitting: Physician Assistant

## 2018-07-14 ENCOUNTER — Ambulatory Visit: Payer: Medicare Other | Admitting: Physician Assistant

## 2018-07-14 ENCOUNTER — Other Ambulatory Visit: Payer: Self-pay

## 2018-07-14 VITALS — BP 122/74 | HR 92 | Ht 64.0 in | Wt 194.0 lb

## 2018-07-14 DIAGNOSIS — I2510 Atherosclerotic heart disease of native coronary artery without angina pectoris: Secondary | ICD-10-CM

## 2018-07-14 DIAGNOSIS — R6889 Other general symptoms and signs: Secondary | ICD-10-CM | POA: Diagnosis not present

## 2018-07-14 DIAGNOSIS — R1319 Other dysphagia: Secondary | ICD-10-CM

## 2018-07-14 DIAGNOSIS — I739 Peripheral vascular disease, unspecified: Secondary | ICD-10-CM | POA: Diagnosis not present

## 2018-07-14 DIAGNOSIS — M8580 Other specified disorders of bone density and structure, unspecified site: Secondary | ICD-10-CM

## 2018-07-14 DIAGNOSIS — E039 Hypothyroidism, unspecified: Secondary | ICD-10-CM

## 2018-07-14 DIAGNOSIS — Z7901 Long term (current) use of anticoagulants: Secondary | ICD-10-CM

## 2018-07-14 DIAGNOSIS — Z0000 Encounter for general adult medical examination without abnormal findings: Secondary | ICD-10-CM

## 2018-07-14 DIAGNOSIS — I2583 Coronary atherosclerosis due to lipid rich plaque: Secondary | ICD-10-CM

## 2018-07-14 DIAGNOSIS — I10 Essential (primary) hypertension: Secondary | ICD-10-CM

## 2018-07-14 DIAGNOSIS — I2721 Secondary pulmonary arterial hypertension: Secondary | ICD-10-CM | POA: Diagnosis not present

## 2018-07-14 DIAGNOSIS — J449 Chronic obstructive pulmonary disease, unspecified: Secondary | ICD-10-CM

## 2018-07-14 DIAGNOSIS — I714 Abdominal aortic aneurysm, without rupture, unspecified: Secondary | ICD-10-CM

## 2018-07-14 DIAGNOSIS — G43909 Migraine, unspecified, not intractable, without status migrainosus: Secondary | ICD-10-CM

## 2018-07-14 DIAGNOSIS — F324 Major depressive disorder, single episode, in partial remission: Secondary | ICD-10-CM

## 2018-07-14 DIAGNOSIS — I4821 Permanent atrial fibrillation: Secondary | ICD-10-CM

## 2018-07-14 DIAGNOSIS — R1310 Dysphagia, unspecified: Secondary | ICD-10-CM

## 2018-07-14 DIAGNOSIS — J4489 Other specified chronic obstructive pulmonary disease: Secondary | ICD-10-CM

## 2018-07-14 DIAGNOSIS — J8410 Pulmonary fibrosis, unspecified: Secondary | ICD-10-CM

## 2018-07-14 DIAGNOSIS — Z0001 Encounter for general adult medical examination with abnormal findings: Secondary | ICD-10-CM

## 2018-07-14 DIAGNOSIS — M109 Gout, unspecified: Secondary | ICD-10-CM

## 2018-07-14 DIAGNOSIS — Z789 Other specified health status: Secondary | ICD-10-CM

## 2018-07-14 DIAGNOSIS — R0902 Hypoxemia: Secondary | ICD-10-CM

## 2018-07-14 DIAGNOSIS — N183 Chronic kidney disease, stage 3 unspecified: Secondary | ICD-10-CM

## 2018-07-14 DIAGNOSIS — E559 Vitamin D deficiency, unspecified: Secondary | ICD-10-CM

## 2018-07-14 DIAGNOSIS — R7309 Other abnormal glucose: Secondary | ICD-10-CM

## 2018-07-14 DIAGNOSIS — K219 Gastro-esophageal reflux disease without esophagitis: Secondary | ICD-10-CM

## 2018-07-14 DIAGNOSIS — I700 Atherosclerosis of aorta: Secondary | ICD-10-CM

## 2018-07-14 DIAGNOSIS — E782 Mixed hyperlipidemia: Secondary | ICD-10-CM

## 2018-07-14 DIAGNOSIS — Z7189 Other specified counseling: Secondary | ICD-10-CM

## 2018-07-14 DIAGNOSIS — I5042 Chronic combined systolic (congestive) and diastolic (congestive) heart failure: Secondary | ICD-10-CM

## 2018-07-15 DIAGNOSIS — J449 Chronic obstructive pulmonary disease, unspecified: Secondary | ICD-10-CM | POA: Diagnosis not present

## 2018-07-18 DIAGNOSIS — S92411K Displaced fracture of proximal phalanx of right great toe, subsequent encounter for fracture with nonunion: Secondary | ICD-10-CM | POA: Diagnosis not present

## 2018-07-21 ENCOUNTER — Other Ambulatory Visit: Payer: Self-pay | Admitting: Adult Health

## 2018-07-24 DIAGNOSIS — J449 Chronic obstructive pulmonary disease, unspecified: Secondary | ICD-10-CM | POA: Diagnosis not present

## 2018-07-25 DIAGNOSIS — M130 Polyarthritis, unspecified: Secondary | ICD-10-CM | POA: Diagnosis not present

## 2018-07-25 DIAGNOSIS — I10 Essential (primary) hypertension: Secondary | ICD-10-CM | POA: Diagnosis not present

## 2018-07-25 DIAGNOSIS — M549 Dorsalgia, unspecified: Secondary | ICD-10-CM | POA: Diagnosis not present

## 2018-07-25 DIAGNOSIS — M069 Rheumatoid arthritis, unspecified: Secondary | ICD-10-CM | POA: Diagnosis not present

## 2018-07-25 DIAGNOSIS — M1990 Unspecified osteoarthritis, unspecified site: Secondary | ICD-10-CM | POA: Diagnosis not present

## 2018-07-26 DIAGNOSIS — N183 Chronic kidney disease, stage 3 (moderate): Secondary | ICD-10-CM | POA: Diagnosis not present

## 2018-07-26 DIAGNOSIS — I129 Hypertensive chronic kidney disease with stage 1 through stage 4 chronic kidney disease, or unspecified chronic kidney disease: Secondary | ICD-10-CM | POA: Diagnosis not present

## 2018-07-26 DIAGNOSIS — D472 Monoclonal gammopathy: Secondary | ICD-10-CM | POA: Diagnosis not present

## 2018-07-26 DIAGNOSIS — N179 Acute kidney failure, unspecified: Secondary | ICD-10-CM | POA: Diagnosis not present

## 2018-07-26 DIAGNOSIS — I2721 Secondary pulmonary arterial hypertension: Secondary | ICD-10-CM | POA: Diagnosis not present

## 2018-07-29 ENCOUNTER — Other Ambulatory Visit (INDEPENDENT_AMBULATORY_CARE_PROVIDER_SITE_OTHER): Payer: Medicare Other | Admitting: Internal Medicine

## 2018-07-29 DIAGNOSIS — J209 Acute bronchitis, unspecified: Secondary | ICD-10-CM

## 2018-07-29 DIAGNOSIS — J4540 Moderate persistent asthma, uncomplicated: Secondary | ICD-10-CM | POA: Diagnosis not present

## 2018-07-29 DIAGNOSIS — J4520 Mild intermittent asthma, uncomplicated: Secondary | ICD-10-CM

## 2018-07-29 MED ORDER — ALBUTEROL SULFATE HFA 108 (90 BASE) MCG/ACT IN AERS: INHALATION_SPRAY | RESPIRATORY_TRACT | 3 refills | Status: AC

## 2018-07-29 MED ORDER — PREDNISONE 10 MG PO TABS: ORAL_TABLET | ORAL | 3 refills | Status: DC

## 2018-07-29 MED ORDER — AZITHROMYCIN 250 MG PO TABS: ORAL_TABLET | ORAL | 1 refills | Status: AC

## 2018-07-29 MED ORDER — LEFLUNOMIDE 10 MG PO TABS: ORAL_TABLET | ORAL | 3 refills | Status: DC

## 2018-07-29 MED ORDER — PREDNISONE 20 MG PO TABS: ORAL_TABLET | ORAL | 0 refills | Status: DC

## 2018-07-29 MED ORDER — IPRATROPIUM-ALBUTEROL 0.5-2.5 (3) MG/3ML IN SOLN: 3.0000 mL | Freq: Four times a day (QID) | RESPIRATORY_TRACT | 1 refills | Status: DC | PRN

## 2018-07-29 NOTE — Progress Notes (Signed)
THIS ENCOUNTER IS A VIRTUAL VISIT DUE TO COVID-19 - PATIENT WAS NOT SEEN IN THE OFFICE.  PATIENT HAS CONSENTED TO VIRTUAL VISIT / TELEMEDICINE VISIT  Virtual Visit via telephone Note  I connected with  Katie [name]   on 07/29/2018 07/29/2018  by telephone.  I verified that I am speaking with the correct person using two identifiers.    I discussed the limitations of evaluation and management by telemedicine and the availability of in person appointments. The patient expressed understanding and agreed to proceed.  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 Has started using her Duoneb and using her oxygen more.  Medications  Current Outpatient Medications (Endocrine & Metabolic):  .  levothyroxine (SYNTHROID) 100 MCG tablet, Take 1 tablet daily on an empty stomach with only water for 30 minutes.  .  digoxin (LANOXIN) 0.125 MG tablet, Take 1 tablet daily. .  isosorbide mononitrate (IMDUR) 30 MG 24 hr tablet, TAKE 1 TABLET DAILY FOR BP & HEART .  metolazone (ZAROXOLYN) 2.5 MG tablet, TAKE 1 TABLET  30 MINS PRIOR TO FLUID PILL AS NEEDED AS DIRECTED .  metoprolol succinate (TOPROL-XL) 25 MG 24 hr tablet, Take 3 tablets daily.  .  pravastatin (PRAVACHOL) 40 MG tablet, TAKE 1/2 TABLETAT BEDTIME FOR CHOLESTEROL .  torsemide (DEMADEX) 20 MG tablet, Take 2 tablets  2  times daily. Marland Kitchen  albuterol HFA inhaler, INHALE 2 PUFFS  10 TO 15 MINS APART EVERY 4 HOURS AS NEEDED FOR ASTHMA FLARE .  cetirizine 10 MG tablet, Take 10 mg  for allergies.  Marland Kitchen  guaiFENesin  600 MG 12 hr tablet, Take 1 tablet  daily as needed .  ipratropium 0.06 % nasal spray, Use 1 to 2 sprays each  Nostril 2 to 3 x / day as needed  .  montelukast  10 MG tablet, Take 1 tablet daily for Allergies & Asthma  .  acetaminophen  500 MG tablet, Take 1,000 mg  every 6  hours as needed for moderate pain.  Marland Kitchen  allopurinol  300 MG tablet, TAKE 1 TABLET  EVERY DAY  .  aspirin EC 81 MG tablet, Take  daily. .  Rivaroxaban 15 MG TABS tablet, Take 1 tablet Daily for A fib .  VITAMIN D 5000 units , Take 5,000 Units  daily.  .  Eyelid Cleansers (AVENOVA) 0.01 % SOLN, Apply 1 application topically See admin instructions.  .  gabapentin  600 MG tablet, Take 1 tablet 3 x /Day for Pain .  pantoprazole 40 MG tablet, Take 1 tablet  daily. [name] Glycol-Propyl Glycol (SYSTANE OP), Place 1 drop into both eyes 4 (four) times daily as needed (dry eyes).  .  potassium chloride 20 MEQ tablet, TAKE 2 TABLETS DAILY .  PRED FORTE 1 % ophth susp, Place 1 drop into both eyes 2 (two) times daily.  .  prochlorperazine  5 MG tablet, TAKE 1 TABLET  THREE TIMES A DAY AS NEEDED  .  traZODone150 MG tablet, Take 1/2 to 1 tablet 1 hour before sleep .  triamcinolone cream (KENALOG) 0.1 %, Apply 1 application topically daily.   Problem  list She has Migraine headache; Essential hypertension; Atherosclerosis of native coronary artery of native heart without angina pectoris; Atrial fibrillation ([place]); Chronic combined systolic and diastolic CHF (congestive heart failure) ([place]); Chronic obstructive pulmonary disease with hypoxia ([place]); Gastroesophageal reflux disease without esophagitis; Osteopenia; Hypothyroidism; Vitamin D deficiency; Long term current use of anticoagulant therapy; CKD (chronic kidney disease) stage 3, GFR 30-59 ml/min (HCC); Hyperlipidemia, mixed; Pulmonary Fibrosis sequellae of Amiodarone; PVD (peripheral vascular disease) with claudication ([place]); Morbid obesity ([place]); AAA (abdominal aortic aneurysm) without rupture ([place]); Abnormal glucose; Gout; Esophageal dysphagia; PAH (pulmonary artery hypertension) ([place]);  Depression; Bradycardia; Aortic atherosclerosis (HCC); COPD (chronic obstructive pulmonary disease) with chronic bronchitis ([place]); and Coronary artery disease due to lipid rich plaque on their problem list.   Observations/Objective:  General : In no apparent distress HEENT: Slight hoarseness, occasional  congested cough. Lungs: speaks in complete sentences, some  audible wheezing with cough Neurological: alert, oriented x 3  Assessment and Plan:  1. Mild intermittent asthma without complication  - albuterol (PROAIR HFA) 108 (90 Base) MCG/ACT inhaler; Use 2 Inhalations 15 minutes Apart every 4 hours to Rescue Asthma Attack  Dispense: 42.5 Inhaler; Refill: 3  2. Moderate persistent chronic asthma without complication  - ipratropium-albuterol (DUONEB) 0.5-2.5 (3) MG/3ML SOLN; Inhale 3 mLs into the lungs every 6 (six) hours as needed.  Dispense: 1080 mL; Refill: 1  3. Acute bronchitis, unspecified organism  - azithromycin (ZITHROMAX) 250 MG tablet; Take 2 tablets Day 1,  then 1 tablet  Daily with Food  Dispense: 6 each; Refill: 1  - predniSONE (DELTASONE) 20 MG tablet; 1 tab 3 x day for 3 days, then 1 tab 2 x day for 3 days, then 1 tab 1 x day for 5 days  Dispense: 20 tablet; Refill: 0  Follow Up Instructions:      I discussed the assessment and treatment plan with the patient. The patient was provided an opportunity to ask questions and all were answered. The patient agreed with the plan and demonstrated an understanding of the instructions.       The patient was advised to call back or seek an in-person evaluation if the symptoms worsen or if the condition fails to improve as anticipated.  I provided  16 minutes of non-face-to-face time during this encounter.   Kirtland Bouchard, MD

## 2018-08-03 ENCOUNTER — Telehealth: Payer: Self-pay | Admitting: Internal Medicine

## 2018-08-03 ENCOUNTER — Other Ambulatory Visit: Payer: Self-pay | Admitting: Nephrology

## 2018-08-03 DIAGNOSIS — N183 Chronic kidney disease, stage 3 unspecified: Secondary | ICD-10-CM

## 2018-08-03 NOTE — Telephone Encounter (Signed)
Received alert for VT -1 zone episode on 08/02/18 at 1447,  treated with ATP x 2. Appears AF with RVR, ATP unsuccessful, rates fell below detection. HR elevation noted from 1327-1522 on 08/02/18.  Spoke with pt ,denies sx during episode, reports finding out about death of friend at time of episode. Pt reported she is  no longer taking Toprol XL and Imdur, meds discontinued by PCP x 1 month ago due to pt reporting SBP in 80's with med use.   Pt has been self-monitoring BP at home and reports SBP 107 to 125  From 5/18 to 08/02/18.

## 2018-08-08 ENCOUNTER — Other Ambulatory Visit: Payer: Self-pay | Admitting: Internal Medicine

## 2018-08-08 DIAGNOSIS — E039 Hypothyroidism, unspecified: Secondary | ICD-10-CM

## 2018-08-09 ENCOUNTER — Encounter: Payer: Self-pay | Admitting: Internal Medicine

## 2018-08-09 ENCOUNTER — Encounter: Payer: Self-pay | Admitting: Physical Medicine and Rehabilitation

## 2018-08-09 NOTE — Telephone Encounter (Signed)
Lets increase the rate on the VT1 zone in the next few weeks. GT

## 2018-08-09 NOTE — Telephone Encounter (Signed)
Appointment with device clinic scheduled to increase rate in VT-1 zone per Dr Lovena Le.

## 2018-08-09 NOTE — Telephone Encounter (Signed)
Opened in error

## 2018-08-10 ENCOUNTER — Other Ambulatory Visit: Payer: Self-pay | Admitting: Internal Medicine

## 2018-08-10 DIAGNOSIS — I5042 Chronic combined systolic (congestive) and diastolic (congestive) heart failure: Secondary | ICD-10-CM

## 2018-08-11 ENCOUNTER — Other Ambulatory Visit: Payer: Self-pay | Admitting: Internal Medicine

## 2018-08-11 DIAGNOSIS — I5042 Chronic combined systolic (congestive) and diastolic (congestive) heart failure: Secondary | ICD-10-CM

## 2018-08-11 MED ORDER — METOLAZONE 2.5 MG PO TABS: ORAL_TABLET | ORAL | 1 refills | Status: DC

## 2018-08-11 NOTE — Addendum Note (Signed)
Addended by: Vicie Mutters R on: 08/11/2018 11:47 AM   Modules accepted: Orders

## 2018-08-14 ENCOUNTER — Ambulatory Visit (INDEPENDENT_AMBULATORY_CARE_PROVIDER_SITE_OTHER): Payer: Medicare Other | Admitting: *Deleted

## 2018-08-14 DIAGNOSIS — I428 Other cardiomyopathies: Secondary | ICD-10-CM

## 2018-08-15 ENCOUNTER — Other Ambulatory Visit: Payer: Self-pay | Admitting: Family Medicine

## 2018-08-15 DIAGNOSIS — J449 Chronic obstructive pulmonary disease, unspecified: Secondary | ICD-10-CM | POA: Diagnosis not present

## 2018-08-15 MED ORDER — BACLOFEN 10 MG PO TABS: 10.0000 mg | ORAL_TABLET | Freq: Every evening | ORAL | 3 refills | Status: DC | PRN

## 2018-08-16 LAB — CUP PACEART REMOTE DEVICE CHECK
Date Time Interrogation Session: 20200603103255
Implantable Lead Implant Date: 20190829
Implantable Lead Implant Date: 20190829
Implantable Lead Implant Date: 20190829
Implantable Lead Location: 753858
Implantable Lead Location: 753859
Implantable Lead Location: 753860
Implantable Lead Model: 292
Implantable Lead Model: 4674
Implantable Lead Model: 7740
Implantable Lead Serial Number: 446776
Implantable Lead Serial Number: 696999
Implantable Lead Serial Number: 827497
Implantable Pulse Generator Implant Date: 20190829
Pulse Gen Serial Number: 215092

## 2018-08-21 ENCOUNTER — Encounter: Payer: Self-pay | Admitting: Orthopaedic Surgery

## 2018-08-21 ENCOUNTER — Other Ambulatory Visit: Payer: Self-pay

## 2018-08-21 ENCOUNTER — Ambulatory Visit: Payer: Self-pay

## 2018-08-21 ENCOUNTER — Ambulatory Visit (INDEPENDENT_AMBULATORY_CARE_PROVIDER_SITE_OTHER): Payer: Medicare Other | Admitting: Orthopaedic Surgery

## 2018-08-21 DIAGNOSIS — M79674 Pain in right toe(s): Secondary | ICD-10-CM

## 2018-08-21 DIAGNOSIS — S92411D Displaced fracture of proximal phalanx of right great toe, subsequent encounter for fracture with routine healing: Secondary | ICD-10-CM | POA: Diagnosis not present

## 2018-08-21 NOTE — Progress Notes (Signed)
The patient is following up with a proximal phalanx fracture of her right great toe that is been going on for several months now.  I think her slow healing is related to her age and peripheral vascular disease.  She has had a bone stimulator as well.  She says the pain is minimal and she only takes Tylenol.  She is about 6 weeks out from my left ankle steroid injection that was done on her left ankle joint.  She would like to have another one today however she understands that this is too early to have that type of injection.  On examination of her left ankle there is only slight swelling with a painful arc of motion of the ankle.  Examination of her right great toe shows minimal pain and no swelling and no discoloration.  The MTP joint is normal.  Trays of the right great toe show significant interval healing of the fracture.  It is only visible on one plane and there is significant bony consolidation.  We would not need x-rays again.  Her pain is minimal and she is only taken Tylenol for it.  She can return to see [place] in about 6 weeks for repeat intra-articular injection of a steroid into her left ankle joint.  She can stop bone stimulator for her right great toe.  All question concerns were answered and addressed.

## 2018-08-22 ENCOUNTER — Encounter: Payer: Self-pay | Admitting: Cardiology

## 2018-08-22 ENCOUNTER — Ambulatory Visit
Admission: RE | Admit: 2018-08-22 | Discharge: 2018-08-22 | Disposition: A | Discharge status: Home / Self Care | Payer: Medicare Other | Source: Ambulatory Visit | Attending: Nephrology | Admitting: Nephrology

## 2018-08-22 DIAGNOSIS — N183 Chronic kidney disease, stage 3 unspecified: Secondary | ICD-10-CM

## 2018-08-22 NOTE — Progress Notes (Signed)
Remote ICD transmission.   

## 2018-08-22 NOTE — Progress Notes (Signed)
Assessment and Plan:  Katie [name] was seen today for follow-up.  Diagnoses and all orders for this visit:  Essential hypertension Not tolerating low dose toprol or imdur;  She has been doing well with torsemide 40 mg BID; will have her continue this, d/c toprol, imdur only if 150/90+ though on review of logs she has been well controlled Monitor blood pressure at home; call if consistently over 140/80 Continue DASH diet.   Reminder to go to the ER if any CP, SOB, nausea, dizziness, severe HA, changes vision/speech, left arm numbness and tingling and jaw pain. Follow up with cardiology as scheduled  Chronic combined systolic and diastolic CHF (congestive heart failure) ([place]) Appears euvoluemic;  Emphasized salt restriction, less than 2000mg  a day. Encouraged daily monitoring of the patient's weight, call office if 5 lb weight loss or gain in a day.  Encouraged regular exercise. If any increasing shortness of breath, swelling, or chest pressure go to ER immediately.  decrease your fluid intake to less than 60 fluid ounces daily   Further disposition pending results of labs. Discussed med's effects and SE's.   Over 15 minutes of exam, counseling, chart review, and critical decision making was performed.   Future Appointments  Date Time Provider [place]  09/05/2018 10:00 AM [facility] DEVICE 1 [facility]  10/02/2018  9:00 AM Mcarthur Rossetti, MD [facility] None  11/13/2018  7:35 AM [facility] DEVICE REMOTES [facility]  02/27/2019  9:00 AM [name], MD [facility] None  07/31/2019  9:00 AM Vicie Mutters, PA-C [facility] None    ------------------------------------------------------------------------------------------------------------------   HPI 78 y.o.female with complex cardiac history - CAD, CHF, a. Fib (on digoxin), PAH presents for follow up for blood pressure after meds adjusted due to concerns with hypotension. She was previously on  toprol 25 mg, torsemide 40 mg BID, imdur 30 mg daily.   toprol 25 mg was d/c'd by Dr. Melford Aase 2-3 weeks ago due to concerns with hypotension (patient reported 80s/50s for 3-4 days), didn't tolerate 1/2 tab either.   She has been taking torsemide 40 mg BID, and taking 15 mg of imdur if BP 140/90+ and presents with log. Demonstrates somewhat labile values ranging 99-143/60s-70s, except on 2 occasions when she took imdur drops to 80s/40s and feels very fatigued and dizzy.   She denies orthopnea, paroxysmal nocturnal dyspnea and edema. Positive for none. She has exertional dyspnea consistent with baseline (complex pulmonary hx, pulm fibroses sequela of amiodarone, has home O2). She reports on home scale she has been stable ranging 199-204 lb. She attributes current weight to prednisone taper prescribed by ortho.  Wt Readings from Last 3 Encounters:  08/23/18 207 lb (93.9 kg)  07/14/18 194 lb (88 kg)  07/04/18 211 lb 12.8 oz (96.1 kg)    Past Medical History:  Diagnosis Date  . AAA (abdominal aortic aneurysm) ([place])   . AICD (automatic cardioverter/defibrillator) present 11/10/2017  . Arthritis    "some in my knees" (11/10/2017)  . Atrial fibrillation ([place])   . CHF (congestive heart failure) ([place])   . Chronic bronchitis ([place])   . COPD (chronic obstructive pulmonary disease) ([place])    'they say I don't; have a problem breathing though" (11/10/2017)  . Depression   . GERD (gastroesophageal reflux disease)   . Gout    "daily RX" (11/10/2017)  . History of hiatal hernia   . Hyperlipidemia   . Hypertension   . Hypothyroid   . Migraine headache    "hx; none since 1980s" (11/10/2017)  .  Osteopenia   . Pneumonia    "~ 3 times" (11/10/2017)  . PVD (peripheral vascular disease) (HCC)      Allergies  Allergen Reactions  . Amiodarone Other (See Comments)    PULMONARY TOXICITY  . Diovan [Valsartan] Other (See Comments)    HYPOTENSION  . Doxycycline Diarrhea and Other (See Comments)    VISUAL  DISTURBANCE  . Flexeril [Cyclobenzaprine] Other (See Comments)    FATIGUE  . Keflex [Cephalexin] Diarrhea  . Verapamil Other (See Comments)    EDEMA  . Codeine Hives    Current Outpatient Medications on File Prior to Visit  Medication Sig  . acetaminophen (TYLENOL) 500 MG tablet Take 1,000 mg by mouth every 6 (six) hours as needed for moderate pain.   Marland Kitchen albuterol (PROAIR HFA) 108 (90 Base) MCG/ACT inhaler Use 2 Inhalations 15 minutes Apart every 4 hours to Rescue Asthma Attack  . allopurinol (ZYLOPRIM) 300 MG tablet TAKE 1 TABLET BY MOUTH EVERY DAY (Patient taking differently: Take 300 mg by mouth daily. )  . aspirin EC 81 MG tablet Take 81 mg by mouth daily.  . cetirizine (ZYRTEC) 10 MG tablet Take 10 mg by mouth as needed for allergies.   . Cholecalciferol (VITAMIN D3) 5000 units CAPS Take 5,000 Units by mouth daily.   . digoxin (LANOXIN) 0.125 MG tablet Take 1 tablet (0.125 mg total) by mouth daily.  . Eyelid Cleansers (AVENOVA) 0.01 % SOLN Apply 1 application topically See admin instructions. Wash eyelids twice daily with cleaner  . gabapentin (NEURONTIN) 600 MG tablet Take 1 tablet 3 x /Day for Pain  . ipratropium (ATROVENT) 0.06 % nasal spray Use 1 to 2 sprays each Nostril 2 to 3 x / day as needed (Patient taking differently: Place 1-2 sprays into both nostrils 3 (three) times daily as needed for rhinitis. )  . ipratropium-albuterol (DUONEB) 0.5-2.5 (3) MG/3ML SOLN Inhale 3 mLs into the lungs every 6 (six) hours as needed.  . isosorbide mononitrate (IMDUR) 30 MG 24 hr tablet TAKE 1 TABLET DAILY FOR BP & HEART (Patient taking differently: Take 30 mg by mouth as needed. )  . levothyroxine (SYNTHROID) 100 MCG tablet TAKE 1 TABLET DAILY ON AN EMPTY STOMACH WITH ONLY WATER FOR 30 MINUTES.  . montelukast (SINGULAIR) 10 MG tablet Take 1 tablet daily for Allergies & Asthma (Patient taking differently: Take 10 mg by mouth at bedtime. )  . pantoprazole (PROTONIX) 40 MG tablet Take 1 tablet (40  mg total) by mouth daily.  [name] Glycol-Propyl Glycol (SYSTANE OP) Place 1 drop into both eyes 4 (four) times daily as needed (dry eyes).   . potassium chloride SA (KLOR-CON M20) 20 MEQ tablet TAKE 2 TABLETS BY MOUTH DAILY  . pravastatin (PRAVACHOL) 40 MG tablet TAKE 1/2 TABLET BY MOUTH AT BEDTIME FOR CHOLESTEROL  . prednisoLONE acetate (PRED FORTE) 1 % ophthalmic suspension Place 1 drop into both eyes 2 (two) times daily.   . predniSONE (DELTASONE) 20 MG tablet 1 tab 3 x day for 3 days, then 1 tab 2 x day for 3 days, then 1 tab 1 x day for 5 days  . Rivaroxaban (XARELTO) 15 MG TABS tablet Take 1 tablet Daily for A fib  . torsemide (DEMADEX) 20 MG tablet Take 2 tablets (40 mg total) by mouth 2 (two) times daily.  Marland Kitchen triamcinolone cream (KENALOG) 0.1 % Apply 1 application topically daily.   . baclofen (LIORESAL) 10 MG tablet Take 1 tablet (10 mg total) by mouth at bedtime  as needed for muscle spasms. (Patient not taking: Reported on 08/23/2018)  . guaiFENesin (MUCINEX) 600 MG 12 hr tablet Take 1 tablet by mouth daily as needed (runny nose).   . metoprolol succinate (TOPROL-XL) 25 MG 24 hr tablet Take 3 tablets (75 mg total) by mouth daily. Take with or immediately following a meal. (Patient not taking: Reported on 08/23/2018)  . prochlorperazine (COMPAZINE) 5 MG tablet TAKE 1 TABLET BY MOUTH THREE TIMES A DAY AS NEEDED FOR VERTIGO OR NAUSEA (Patient not taking: No sig reported)  . traZODone (DESYREL) 150 MG tablet Take 1/2 to 1 tablet 1 hour before sleep (Patient not taking: Reported on 08/23/2018)   No current facility-administered medications on file prior to visit.     ROS: all negative except above.   Physical Exam:  BP 132/86   Pulse 94   Temp (!) 97.5 F (36.4 C)   Ht 5\' 4"  (1.626 m)   Wt 207 lb (93.9 kg)   SpO2 97%   BMI 35.53 kg/m   General Appearance: Well nourished, in no apparent distress. Eyes: conjunctiva no swelling or erythema ENT/Mouth: No erythema, swelling, or  exudate on post pharynx.  Tonsils not swollen or erythematous. Hearing normal.  Neck: Supple, thyroid normal.  Respiratory: Respiratory effort normal, some pursed lip breathing (this is baseline for her) - rate normal, no retractions, BS equal bilaterally without rales, rhonchi, wheezing or stridor.  Cardio: RRR with no MRGs. Brisk peripheral pulses with scant pedal non-pitting edema.  Abdomen: Soft, + BS.  Non tender, no guarding, rebound, hernias, masses. Lymphatics: Non tender without lymphadenopathy.  Musculoskeletal: normal gait.  Skin: Warm, dry without rashes, lesions, ecchymosis.  Neuro: Normal muscle tone, no cerebellar symptoms.  Psych: Awake and oriented X 3, normal affect, Insight and Judgment appropriate.     Izora Ribas, NP 2:23 PM [facility]

## 2018-08-23 ENCOUNTER — Other Ambulatory Visit: Payer: Self-pay

## 2018-08-23 ENCOUNTER — Encounter: Payer: Self-pay | Admitting: Adult Health

## 2018-08-23 ENCOUNTER — Ambulatory Visit (INDEPENDENT_AMBULATORY_CARE_PROVIDER_SITE_OTHER): Payer: Medicare Other | Admitting: Adult Health

## 2018-08-23 VITALS — BP 132/86 | HR 94 | Temp 97.5°F | Ht 64.0 in | Wt 207.0 lb

## 2018-08-23 DIAGNOSIS — I10 Essential (primary) hypertension: Secondary | ICD-10-CM | POA: Diagnosis not present

## 2018-08-23 DIAGNOSIS — I5042 Chronic combined systolic (congestive) and diastolic (congestive) heart failure: Secondary | ICD-10-CM

## 2018-08-23 NOTE — Patient Instructions (Signed)
Take 1/2 tab of imdur only if blood pressure is over 150/90    Do the following things EVERYDAY: 1) Weigh yourself in the morning before breakfast or at the same time every day. Write it down and keep it in a log. 2) Take your medicines as prescribed 3) Eat low salt foods-Limit salt (sodium) to 2000 mg per day. Best thing to do is avoid processed foods.   4) Stay as active as you can everyday 5) Limit all fluids for the day to less than 1.5 liters  Call your doctor if:  Anytime you have any of the following symptoms:  1) 2 pound weight gain in 24 hours or 5 pounds in 1 week  2) shortness of breath, with or without a dry hacking cough  3) swelling in the hands, LEGs, feet or stomach  4) if you have to sleep on extra pillows at night in order to breathe. 5) after laying down at night for 20-30 mins, you wake up short of breath.   These can all be signs of fluid overload.     Hypotension As your heart beats, it forces blood through your body. Hypotension, commonly called low blood pressure, is when the force of blood pumping through your arteries is too weak. Arteries are blood vessels that carry blood from the heart throughout the body. Depending on the cause and severity, hypotension may be harmless (benign) or may cause serious problems (be critical). When blood pressure is too low, you may not get enough blood to your brain or to the rest of your organs. This can cause weakness, light-headedness, rapid heartbeat, and fainting. What are the causes? This condition may be caused by:  Blood loss.  Loss of body fluids (dehydration).  Heart problems.  Hormone (endocrine) problems.  Pregnancy.  Severe infection.  Lack of certain nutrients.  Severe allergic reactions (anaphylaxis).  Certain medicines, such as blood pressure medicine or medicines that make the body lose excess fluids (diuretics). Sometimes, hypotension may be caused by not taking medicine as directed, such as  taking too much of a certain medicine. What increases the risk? The following factors may make you more likely to develop this condition:  Age. Risk increases as you get older.  Conditions that affect the heart or the central nervous system.  Taking certain medicines, such as blood pressure medicine or diuretics.  Being pregnant. What are the signs or symptoms? Common symptoms of this condition include:  Weakness.  Light-headedness.  Dizziness.  Blurred vision.  Fatigue.  Rapid heartbeat.  Fainting, in severe cases. How is this diagnosed? This condition is diagnosed based on:  Your medical history.  Your symptoms.  Your blood pressure measurement. Your health care provider will check your blood pressure when you are: ? Lying down. ? Sitting. ? Standing. A blood pressure reading is recorded as two numbers, such as "120 over 80" (or 120/80). The first ("top") number is called the systolic pressure. It is a measure of the pressure in your arteries as your heart beats. The second ("bottom") number is called the diastolic pressure. It is a measure of the pressure in your arteries when your heart relaxes between beats. Blood pressure is measured in a unit called mm Hg. Healthy blood pressure for most adults is 120/80. If your blood pressure is below 90/60, you may be diagnosed with hypotension. Other information or tests that may be used to diagnose hypotension include:  Your other vital signs, such as your heart rate and temperature.  Blood tests.  Tilt table test. For this test, you will be safely secured to a table that moves you from a lying position to an upright position. Your heart rhythm and blood pressure will be monitored during the test. How is this treated? Treatment for this condition may include:  Changing your diet. This may involve eating more salt (sodium) or drinking more water.  Taking medicines to raise your blood pressure.  Changing the dosage of  certain medicines you are taking that might be lowering your blood pressure.  Wearing compression stockings. These stockings help to prevent blood clots and reduce swelling in your legs. In some cases, you may need to go to the hospital for:  Fluid replacement. This means you will receive fluids through an IV.  Blood replacement. This means you will receive donated blood through an IV (transfusion).  Treating an infection or heart problems, if this applies.  Monitoring. You may need to be monitored while medicines that you are taking wear off. Follow these instructions at home: Eating and drinking   Drink enough fluid to keep your urine pale yellow.  Eat a healthy diet, and follow instructions from your health care provider about eating or drinking restrictions. A healthy diet includes: ? Fresh fruits and vegetables. ? Whole grains. ? Lean meats. ? Low-fat dairy products.  Eat extra salt only as directed. Do not add extra salt to your diet unless your health care provider told you to do that.  Eat frequent, small meals.  Avoid standing up suddenly after eating. Medicines  Take over-the-counter and prescription medicines only as told by your health care provider. ? Follow instructions from your health care provider about changing the dosage of your current medicines, if this applies. ? Do not stop or adjust any of your medicines on your own. General instructions   Wear compression stockings as told by your health care provider.  Get up slowly from lying down or sitting positions. This gives your blood pressure a chance to adjust.  Avoid hot showers and excessive heat as directed by your health care provider.  Return to your normal activities as told by your health care provider. Ask your health care provider what activities are safe for you.  Do not use any products that contain nicotine or tobacco, such as cigarettes, e-cigarettes, and chewing tobacco. If you need help  quitting, ask your health care provider.  Keep all follow-up visits as told by your health care provider. This is important. Contact a health care provider if you:  Vomit.  Have diarrhea.  Have a fever for more than 2-3 days.  Feel more thirsty than usual.  Feel weak and tired. Get help right away if you:  Have chest pain.  Have a fast or irregular heartbeat.  Develop numbness in any part of your body.  Cannot move your arms or your legs.  Have trouble speaking.  Become sweaty or feel light-headed.  Faint.  Feel short of breath.  Have trouble staying awake.  Feel confused. Summary  Hypotension is when the force of blood pumping through your arteries is too weak.  Hypotension may be harmless (benign) or may cause serious problems (be critical).  Treatment for this condition may include changing your diet, changing your medicines, and wearing compression stockings.  In some cases, you may need to go to the hospital for fluid or blood replacement. This information is not intended to replace advice given to you by your health care provider. Make sure you  discuss any questions you have with your health care provider. Document Released: 03/01/2005 Document Revised: 08/25/2017 Document Reviewed: 08/25/2017 Elsevier Interactive Patient Education  [employer].

## 2018-08-24 DIAGNOSIS — J449 Chronic obstructive pulmonary disease, unspecified: Secondary | ICD-10-CM | POA: Diagnosis not present

## 2018-08-28 ENCOUNTER — Other Ambulatory Visit: Payer: Self-pay | Admitting: *Deleted

## 2018-08-28 ENCOUNTER — Other Ambulatory Visit: Payer: Self-pay | Admitting: Internal Medicine

## 2018-08-28 DIAGNOSIS — I10 Essential (primary) hypertension: Secondary | ICD-10-CM | POA: Diagnosis not present

## 2018-08-28 DIAGNOSIS — M1990 Unspecified osteoarthritis, unspecified site: Secondary | ICD-10-CM | POA: Diagnosis not present

## 2018-08-28 DIAGNOSIS — M25539 Pain in unspecified wrist: Secondary | ICD-10-CM | POA: Diagnosis not present

## 2018-08-28 DIAGNOSIS — E039 Hypothyroidism, unspecified: Secondary | ICD-10-CM | POA: Diagnosis not present

## 2018-08-28 DIAGNOSIS — M069 Rheumatoid arthritis, unspecified: Secondary | ICD-10-CM | POA: Diagnosis not present

## 2018-08-28 DIAGNOSIS — M130 Polyarthritis, unspecified: Secondary | ICD-10-CM | POA: Diagnosis not present

## 2018-08-28 DIAGNOSIS — M549 Dorsalgia, unspecified: Secondary | ICD-10-CM | POA: Diagnosis not present

## 2018-08-28 DIAGNOSIS — N189 Chronic kidney disease, unspecified: Secondary | ICD-10-CM | POA: Diagnosis not present

## 2018-08-28 MED ORDER — TORSEMIDE 20 MG PO TABS: 40.0000 mg | ORAL_TABLET | Freq: Two times a day (BID) | ORAL | 2 refills | Status: DC

## 2018-09-01 ENCOUNTER — Telehealth: Payer: Self-pay

## 2018-09-01 NOTE — Telephone Encounter (Signed)
Appointment 09-05-2018 at 10 am.      COVID-19 Pre-Screening Questions:  . In the past 7 to 10 days have you had a cough,  shortness of breath, headache, congestion, fever (100 or greater) body aches, chills, sore throat, or sudden loss of taste or sense of smell?no . Have you been around anyone with known Covid 19. no . Have you been around anyone who is awaiting Covid 19 test results in the past 7 to 10 days? no . Have you been around anyone who has been exposed to Covid 19, or has mentioned symptoms of Covid 19 within the past 7 to 10 days? no  If you have any concerns/questions about symptoms patients report during screening (either on the phone or at threshold). Contact the provider seeing the patient or DOD for further guidance.  If neither are available contact a member of the leadership team.  Pt answered No to all covid-19 questions. I asked the pt to wear a mask to her appointment. I informed the pt that we are reducing the number of people who is coming into the office. If she can come alone to the visit to please do so. I also told her if anything changes between now and her office visit to call and let us know. Pt verbalized understanding.

## 2018-09-05 ENCOUNTER — Ambulatory Visit (INDEPENDENT_AMBULATORY_CARE_PROVIDER_SITE_OTHER): Payer: Medicare Other | Admitting: Internal Medicine

## 2018-09-05 ENCOUNTER — Other Ambulatory Visit: Payer: Self-pay

## 2018-09-05 VITALS — BP 140/83 | HR 94 | Wt 206.4 lb

## 2018-09-05 DIAGNOSIS — I4821 Permanent atrial fibrillation: Secondary | ICD-10-CM

## 2018-09-05 LAB — CBC WITH DIFFERENTIAL/PLATELET
Basophils Absolute: 0.1 10*3/uL (ref 0.0–0.2)
Basos: 1 %
EOS (ABSOLUTE): 0.2 10*3/uL (ref 0.0–0.4)
Eos: 2 %
Hematocrit: 46.7 % — ABNORMAL HIGH (ref 34.0–46.6)
Hemoglobin: 15.5 g/dL (ref 11.1–15.9)
Immature Grans (Abs): 0 10*3/uL (ref 0.0–0.1)
Immature Granulocytes: 0 %
Lymphocytes Absolute: 2.3 10*3/uL (ref 0.7–3.1)
Lymphs: 25 %
MCH: 30 pg (ref 26.6–33.0)
MCHC: 33.2 g/dL (ref 31.5–35.7)
MCV: 90 fL (ref 79–97)
Monocytes Absolute: 1 10*3/uL — ABNORMAL HIGH (ref 0.1–0.9)
Monocytes: 10 %
Neutrophils Absolute: 5.8 10*3/uL (ref 1.4–7.0)
Neutrophils: 62 %
Platelets: 256 10*3/uL (ref 150–450)
RBC: 5.17 x10E6/uL (ref 3.77–5.28)
RDW: 14 % (ref 11.7–15.4)
WBC: 9.3 10*3/uL (ref 3.4–10.8)

## 2018-09-05 LAB — BASIC METABOLIC PANEL
BUN/Creatinine Ratio: 15 (ref 12–28)
BUN: 23 mg/dL (ref 8–27)
CO2: 24 mmol/L (ref 20–29)
Calcium: 10.1 mg/dL (ref 8.7–10.3)
Chloride: 100 mmol/L (ref 96–106)
Creatinine, Ser: 1.56 mg/dL — ABNORMAL HIGH (ref 0.57–1.00)
GFR calc Af Amer: 37 mL/min/{1.73_m2} — ABNORMAL LOW (ref >59)
GFR calc non Af Amer: 32 mL/min/{1.73_m2} — ABNORMAL LOW (ref >59)
Glucose: 97 mg/dL (ref 65–99)
Potassium: 4.3 mmol/L (ref 3.5–5.2)
Sodium: 141 mmol/L (ref 134–144)

## 2018-09-05 NOTE — Progress Notes (Signed)
HPI Katie [name] returns today for an unscheduled visit for evaluation of atrial fib with an RVR. She underwent biv ICD insertion several months ago. Unfortunately her atrial fib has been difficult to control. She has had her beta blocker stopped due to hypotension. She has worsening CHF symptoms. She denies palpitations. No edema. No chest pain. She is pacing less than 50% of the time. Allergies  Allergen Reactions  . Amiodarone Other (See Comments)    PULMONARY TOXICITY  . Diovan [Valsartan] Other (See Comments)    HYPOTENSION  . Doxycycline Diarrhea and Other (See Comments)    VISUAL DISTURBANCE  . Flexeril [Cyclobenzaprine] Other (See Comments)    FATIGUE  . Keflex [Cephalexin] Diarrhea  . Verapamil Other (See Comments)    EDEMA  . Codeine Hives     Current Outpatient Medications  Medication Sig Dispense Refill  . acetaminophen (TYLENOL) 500 MG tablet Take 1,000 mg by mouth every 6 (six) hours as needed for moderate pain.     Marland Kitchen albuterol (PROAIR HFA) 108 (90 Base) MCG/ACT inhaler Use 2 Inhalations 15 minutes Apart every 4 hours to Rescue Asthma Attack 42.5 Inhaler 3  . allopurinol (ZYLOPRIM) 300 MG tablet TAKE 1 TABLET BY MOUTH EVERY DAY (Patient taking differently: Take 300 mg by mouth daily. ) 90 tablet 1  . aspirin EC 81 MG tablet Take 81 mg by mouth daily.    . cetirizine (ZYRTEC) 10 MG tablet Take 10 mg by mouth as needed for allergies.     . Cholecalciferol (VITAMIN D3) 5000 units CAPS Take 5,000 Units by mouth daily.     . digoxin (LANOXIN) 0.125 MG tablet Take 1 tablet (0.125 mg total) by mouth daily. 90 tablet 1  . Eyelid Cleansers (AVENOVA) 0.01 % SOLN Apply 1 application topically See admin instructions. Wash eyelids twice daily with cleaner  99  . gabapentin (NEURONTIN) 600 MG tablet Take 1 tablet 3 x /Day for Pain (Patient taking differently: Take 600 mg by mouth 2 (two) times daily. Take 1 tablet 3 x /Day for Pain) 270 tablet 1  . guaiFENesin (MUCINEX) 600 MG  12 hr tablet Take 1 tablet by mouth daily as needed (runny nose).     Marland Kitchen ipratropium (ATROVENT) 0.06 % nasal spray Use 1 to 2 sprays each Nostril 2 to 3 x / day as needed (Patient taking differently: Place 1-2 sprays into both nostrils 3 (three) times daily as needed for rhinitis. ) 45 mL 3  . ipratropium-albuterol (DUONEB) 0.5-2.5 (3) MG/3ML SOLN Inhale 3 mLs into the lungs every 6 (six) hours as needed. 1080 mL 1  . leflunomide (ARAVA) 10 MG tablet Take 10 mg by mouth daily.     Marland Kitchen levothyroxine (SYNTHROID) 100 MCG tablet TAKE 1 TABLET DAILY ON AN EMPTY STOMACH WITH ONLY WATER FOR 30 MINUTES. 90 tablet 1  . montelukast (SINGULAIR) 10 MG tablet Take 1 tablet daily for Allergies & Asthma (Patient taking differently: Take 10 mg by mouth at bedtime. ) 90 tablet 1  . pantoprazole (PROTONIX) 40 MG tablet Take 1 tablet (40 mg total) by mouth daily.    [name] Glycol-Propyl Glycol (SYSTANE OP) Place 1 drop into both eyes 4 (four) times daily as needed (dry eyes).     . potassium chloride SA (KLOR-CON M20) 20 MEQ tablet TAKE 2 TABLETS BY MOUTH DAILY 180 tablet 1  . pravastatin (PRAVACHOL) 40 MG tablet TAKE 1/2 TABLET BY MOUTH AT BEDTIME FOR CHOLESTEROL (Patient taking differently: Take 20  mg by mouth every other day. ) 45 tablet 3  . prednisoLONE acetate (PRED FORTE) 1 % ophthalmic suspension Place 1 drop into both eyes 2 (two) times daily.   1  . prochlorperazine (COMPAZINE) 5 MG tablet TAKE 1 TABLET BY MOUTH THREE TIMES A DAY AS NEEDED FOR VERTIGO OR NAUSEA (Patient taking differently: Take 5 mg by mouth 3 (three) times daily as needed. ) 60 tablet 1  . Rivaroxaban (XARELTO) 15 MG TABS tablet Take 1 tablet Daily for A fib 90 tablet 3  . torsemide (DEMADEX) 20 MG tablet Take 2 tablets (40 mg total) by mouth 2 (two) times daily. 360 tablet 2  . triamcinolone cream (KENALOG) 0.1 % Apply 1 application topically daily.     . isosorbide mononitrate (IMDUR) 30 MG 24 hr tablet TAKE 1 TABLET DAILY FOR BP & HEART  (Patient not taking: No sig reported) 90 tablet 1   No current facility-administered medications for this visit.      Past Medical History:  Diagnosis Date  . AAA (abdominal aortic aneurysm) ([place])   . AICD (automatic cardioverter/defibrillator) present 11/10/2017  . Arthritis    "some in my knees" (11/10/2017)  . Atrial fibrillation ([place])   . CHF (congestive heart failure) ([place])   . Chronic bronchitis ([place])   . COPD (chronic obstructive pulmonary disease) ([place])    'they say I don't; have a problem breathing though" (11/10/2017)  . Depression   . GERD (gastroesophageal reflux disease)   . Gout    "daily RX" (11/10/2017)  . History of hiatal hernia   . Hyperlipidemia   . Hypertension   . Hypothyroid   . Migraine headache    "hx; none since 1980s" (11/10/2017)  . Osteopenia   . Pneumonia    "~ 3 times" (11/10/2017)  . PVD (peripheral vascular disease) ([place])     ROS:   All systems reviewed and negative except as noted in the HPI.   Past Surgical History:  Procedure Laterality Date  . ABDOMINAL AORTIC ENDOVASCULAR STENT GRAFT N/A 04/11/2013   Procedure: ABDOMINAL AORTIC ENDOVASCULAR STENT GRAFT WITH RIGHT FEMORAL PATCH ANGIOPLASTY;  Surgeon: Mal Misty, MD;  Location: [facility];  Service: Vascular;  Laterality: N/A;  . BIV ICD INSERTION CRT-D  11/10/2017  . BIV ICD INSERTION CRT-D N/A 11/10/2017   Procedure: BIV ICD INSERTION CRT-D;  Surgeon: [place] Lance, MD;  Location: [facility];  Service: Cardiovascular;  Laterality: N/A;  . BREAST SURGERY     LEFT BREAST BIOPSY  . CARDIAC CATHETERIZATION    . CATARACT EXTRACTION W/ INTRAOCULAR LENS  IMPLANT, BILATERAL Bilateral   . CHOLECYSTECTOMY OPEN  1972  . COLONOSCOPY    . EYE SURGERY Bilateral    "bleeding in my eyes"  . FRACTURE SURGERY    . TIBIA FRACTURE SURGERY Left 1970s  . TONSILLECTOMY    . VARICOSE VEIN SURGERY Bilateral    "laser"     Family History  Problem Relation Age of Onset  . Cirrhosis Mother    . Cancer Mother 31       PANCREAS  . Heart defect Sister   . Breast cancer Sister        age 92  . Heart disease Sister   . Stroke Sister   . Alcohol abuse Father   . Depression Father   . Hypertension Brother   . Hyperlipidemia Son   . Heart disease Daughter      Social History   Socioeconomic History  .  Marital status: Married    Spouse name: Not on file  . Number of children: Not on file  . Years of education: Not on file  . Highest education level: Not on file  Occupational History  . Not on file  Social Needs  . Financial resource strain: Somewhat hard  . Food insecurity    Worry: Never true    Inability: Never true  . Transportation needs    Medical: No    Non-medical: No  Tobacco Use  . Smoking status: Former Smoker    Packs/day: 2.00    Years: 54.00    Pack years: 108.00    Types: Cigarettes    Quit date: 08/10/2002    Years since quitting: 16.0  . Smokeless tobacco: Never Used  Substance and Sexual Activity  . Alcohol use: Yes    Comment: 11/10/2017 "glass of wine q 3-4 months"  . Drug use: Never  . Sexual activity: Yes    Birth control/protection: Post-menopausal  Lifestyle  . Physical activity    Days per week: 3 days    Minutes per session: 40 min  . Stress: Not at all  Relationships  . Social connections    Talks on phone: More than three times a week    Gets together: Twice a week    Attends religious service: More than 4 times per year    Active member of club or organization: Yes    Attends meetings of clubs or organizations: Never    Relationship status: Married  . Intimate partner violence    Fear of current or ex partner: No    Emotionally abused: No    Physically abused: No    Forced sexual activity: No  Other Topics Concern  . Not on file  Social History Narrative   Lives in [place] with husband   One daughter     BP 140/83 (BP Location: Left Arm, Patient Position: Sitting, Cuff Size: Normal)   Pulse 94   Wt 206 lb 6.4  oz (93.6 kg)   SpO2 93%   BMI 35.43 kg/m   Physical Exam:  Well appearing NAD HEENT: Unremarkable Neck:  No JVD, no thyromegally Lymphatics:  No adenopathy Back:  No CVA tenderness Lungs:  Clear HEART:  Regular rate rhythm, no murmurs, no rubs, no clicks Abd:  soft, positive bowel sounds, no organomegally, no rebound, no guarding Ext:  2 plus pulses, no edema, no cyanosis, no clubbing Skin:  No rashes no nodules Neuro:  CN II through XII intact, motor grossly intact  EKG - none  DEVICE  Normal device function.  See PaceArt for details.   Assess/Plan: 1. Uncontrolled atrial fib - her VR is not controlled. I have recommended AV node ablation as we have no good way to slow her down. 2. Chronic systolic heart failure - hopefully her symptoms will improve with rate control and biv pacing. 3. HTN - her blood pressure is now too low. We will follow.  Mikle Bosworth.D.

## 2018-09-05 NOTE — H&P (View-Only) (Signed)
HPI Katie [name] returns today for an unscheduled visit for evaluation of atrial fib with an RVR. She underwent biv ICD insertion several months ago. Unfortunately her atrial fib has been difficult to control. She has had her beta blocker stopped due to hypotension. She has worsening CHF symptoms. She denies palpitations. No edema. No chest pain. She is pacing less than 50% of the time. Allergies  Allergen Reactions  . Amiodarone Other (See Comments)    PULMONARY TOXICITY  . Diovan [Valsartan] Other (See Comments)    HYPOTENSION  . Doxycycline Diarrhea and Other (See Comments)    VISUAL DISTURBANCE  . Flexeril [Cyclobenzaprine] Other (See Comments)    FATIGUE  . Keflex [Cephalexin] Diarrhea  . Verapamil Other (See Comments)    EDEMA  . Codeine Hives     Current Outpatient Medications  Medication Sig Dispense Refill  . acetaminophen (TYLENOL) 500 MG tablet Take 1,000 mg by mouth every 6 (six) hours as needed for moderate pain.     Marland Kitchen albuterol (PROAIR HFA) 108 (90 Base) MCG/ACT inhaler Use 2 Inhalations 15 minutes Apart every 4 hours to Rescue Asthma Attack 42.5 Inhaler 3  . allopurinol (ZYLOPRIM) 300 MG tablet TAKE 1 TABLET BY MOUTH EVERY DAY (Patient taking differently: Take 300 mg by mouth daily. ) 90 tablet 1  . aspirin EC 81 MG tablet Take 81 mg by mouth daily.    . cetirizine (ZYRTEC) 10 MG tablet Take 10 mg by mouth as needed for allergies.     . Cholecalciferol (VITAMIN D3) 5000 units CAPS Take 5,000 Units by mouth daily.     . digoxin (LANOXIN) 0.125 MG tablet Take 1 tablet (0.125 mg total) by mouth daily. 90 tablet 1  . Eyelid Cleansers (AVENOVA) 0.01 % SOLN Apply 1 application topically See admin instructions. Wash eyelids twice daily with cleaner  99  . gabapentin (NEURONTIN) 600 MG tablet Take 1 tablet 3 x /Day for Pain (Patient taking differently: Take 600 mg by mouth 2 (two) times daily. Take 1 tablet 3 x /Day for Pain) 270 tablet 1  . guaiFENesin (MUCINEX) 600 MG  12 hr tablet Take 1 tablet by mouth daily as needed (runny nose).     Marland Kitchen ipratropium (ATROVENT) 0.06 % nasal spray Use 1 to 2 sprays each Nostril 2 to 3 x / day as needed (Patient taking differently: Place 1-2 sprays into both nostrils 3 (three) times daily as needed for rhinitis. ) 45 mL 3  . ipratropium-albuterol (DUONEB) 0.5-2.5 (3) MG/3ML SOLN Inhale 3 mLs into the lungs every 6 (six) hours as needed. 1080 mL 1  . leflunomide (ARAVA) 10 MG tablet Take 10 mg by mouth daily.     Marland Kitchen levothyroxine (SYNTHROID) 100 MCG tablet TAKE 1 TABLET DAILY ON AN EMPTY STOMACH WITH ONLY WATER FOR 30 MINUTES. 90 tablet 1  . montelukast (SINGULAIR) 10 MG tablet Take 1 tablet daily for Allergies & Asthma (Patient taking differently: Take 10 mg by mouth at bedtime. ) 90 tablet 1  . pantoprazole (PROTONIX) 40 MG tablet Take 1 tablet (40 mg total) by mouth daily.    [name] Glycol-Propyl Glycol (SYSTANE OP) Place 1 drop into both eyes 4 (four) times daily as needed (dry eyes).     . potassium chloride SA (KLOR-CON M20) 20 MEQ tablet TAKE 2 TABLETS BY MOUTH DAILY 180 tablet 1  . pravastatin (PRAVACHOL) 40 MG tablet TAKE 1/2 TABLET BY MOUTH AT BEDTIME FOR CHOLESTEROL (Patient taking differently: Take 20  mg by mouth every other day. ) 45 tablet 3  . prednisoLONE acetate (PRED FORTE) 1 % ophthalmic suspension Place 1 drop into both eyes 2 (two) times daily.   1  . prochlorperazine (COMPAZINE) 5 MG tablet TAKE 1 TABLET BY MOUTH THREE TIMES A DAY AS NEEDED FOR VERTIGO OR NAUSEA (Patient taking differently: Take 5 mg by mouth 3 (three) times daily as needed. ) 60 tablet 1  . Rivaroxaban (XARELTO) 15 MG TABS tablet Take 1 tablet Daily for A fib 90 tablet 3  . torsemide (DEMADEX) 20 MG tablet Take 2 tablets (40 mg total) by mouth 2 (two) times daily. 360 tablet 2  . triamcinolone cream (KENALOG) 0.1 % Apply 1 application topically daily.     . isosorbide mononitrate (IMDUR) 30 MG 24 hr tablet TAKE 1 TABLET DAILY FOR BP & HEART  (Patient not taking: No sig reported) 90 tablet 1   No current facility-administered medications for this visit.      Past Medical History:  Diagnosis Date  . AAA (abdominal aortic aneurysm) ([place])   . AICD (automatic cardioverter/defibrillator) present 11/10/2017  . Arthritis    "some in my knees" (11/10/2017)  . Atrial fibrillation ([place])   . CHF (congestive heart failure) ([place])   . Chronic bronchitis ([place])   . COPD (chronic obstructive pulmonary disease) ([place])    'they say I don't; have a problem breathing though" (11/10/2017)  . Depression   . GERD (gastroesophageal reflux disease)   . Gout    "daily RX" (11/10/2017)  . History of hiatal hernia   . Hyperlipidemia   . Hypertension   . Hypothyroid   . Migraine headache    "hx; none since 1980s" (11/10/2017)  . Osteopenia   . Pneumonia    "~ 3 times" (11/10/2017)  . PVD (peripheral vascular disease) ([place])     ROS:   All systems reviewed and negative except as noted in the HPI.   Past Surgical History:  Procedure Laterality Date  . ABDOMINAL AORTIC ENDOVASCULAR STENT GRAFT N/A 04/11/2013   Procedure: ABDOMINAL AORTIC ENDOVASCULAR STENT GRAFT WITH RIGHT FEMORAL PATCH ANGIOPLASTY;  Surgeon: Mal Misty, MD;  Location: [facility];  Service: Vascular;  Laterality: N/A;  . BIV ICD INSERTION CRT-D  11/10/2017  . BIV ICD INSERTION CRT-D N/A 11/10/2017   Procedure: BIV ICD INSERTION CRT-D;  Surgeon: [place] Lance, MD;  Location: [facility];  Service: Cardiovascular;  Laterality: N/A;  . BREAST SURGERY     LEFT BREAST BIOPSY  . CARDIAC CATHETERIZATION    . CATARACT EXTRACTION W/ INTRAOCULAR LENS  IMPLANT, BILATERAL Bilateral   . CHOLECYSTECTOMY OPEN  1972  . COLONOSCOPY    . EYE SURGERY Bilateral    "bleeding in my eyes"  . FRACTURE SURGERY    . TIBIA FRACTURE SURGERY Left 1970s  . TONSILLECTOMY    . VARICOSE VEIN SURGERY Bilateral    "laser"     Family History  Problem Relation Age of Onset  . Cirrhosis Mother    . Cancer Mother 29       PANCREAS  . Heart defect Sister   . Breast cancer Sister        age 57  . Heart disease Sister   . Stroke Sister   . Alcohol abuse Father   . Depression Father   . Hypertension Brother   . Hyperlipidemia Son   . Heart disease Daughter      Social History   Socioeconomic History  .  Marital status: Married    Spouse name: Not on file  . Number of children: Not on file  . Years of education: Not on file  . Highest education level: Not on file  Occupational History  . Not on file  Social Needs  . Financial resource strain: Somewhat hard  . Food insecurity    Worry: Never true    Inability: Never true  . Transportation needs    Medical: No    Non-medical: No  Tobacco Use  . Smoking status: Former Smoker    Packs/day: 2.00    Years: 54.00    Pack years: 108.00    Types: Cigarettes    Quit date: 08/10/2002    Years since quitting: 16.0  . Smokeless tobacco: Never Used  Substance and Sexual Activity  . Alcohol use: Yes    Comment: 11/10/2017 "glass of wine q 3-4 months"  . Drug use: Never  . Sexual activity: Yes    Birth control/protection: Post-menopausal  Lifestyle  . Physical activity    Days per week: 3 days    Minutes per session: 40 min  . Stress: Not at all  Relationships  . Social connections    Talks on phone: More than three times a week    Gets together: Twice a week    Attends religious service: More than 4 times per year    Active member of club or organization: Yes    Attends meetings of clubs or organizations: Never    Relationship status: Married  . Intimate partner violence    Fear of current or ex partner: No    Emotionally abused: No    Physically abused: No    Forced sexual activity: No  Other Topics Concern  . Not on file  Social History Narrative   Lives in [place] with husband   One daughter     BP 140/83 (BP Location: Left Arm, Patient Position: Sitting, Cuff Size: Normal)   Pulse 94   Wt 206 lb  6.4 oz (93.6 kg)   SpO2 93%   BMI 35.43 kg/m   Physical Exam:  Well appearing NAD HEENT: Unremarkable Neck:  No JVD, no thyromegally Lymphatics:  No adenopathy Back:  No CVA tenderness Lungs:  Clear HEART:  Regular rate rhythm, no murmurs, no rubs, no clicks Abd:  soft, positive bowel sounds, no organomegally, no rebound, no guarding Ext:  2 plus pulses, no edema, no cyanosis, no clubbing Skin:  No rashes no nodules Neuro:  CN II through XII intact, motor grossly intact  EKG - none  DEVICE  Normal device function.  See PaceArt for details.   Assess/Plan: 1. Uncontrolled atrial fib - her VR is not controlled. I have recommended AV node ablation as we have no good way to slow her down. 2. Chronic systolic heart failure - hopefully her symptoms will improve with rate control and biv pacing. 3. HTN - her blood pressure is now too low. We will follow.  Katie [name].D.

## 2018-09-05 NOTE — Patient Instructions (Signed)
Medication Instructions:  Your physician recommends that you continue on your current medications as directed. Please refer to the Current Medication list given to you today.  Labwork: You will get lab work today:  BMP and CBC  Testing/Procedures: None ordered.  Follow-Up: See letter for instruction.  Any Other Special Instructions Will Be Listed Below (If Applicable).  If you need a refill on your cardiac medications before your next appointment, please call your pharmacy.

## 2018-09-06 ENCOUNTER — Telehealth: Payer: Self-pay

## 2018-09-06 LAB — CUP PACEART INCLINIC DEVICE CHECK
Date Time Interrogation Session: 20200623040000
HighPow Impedance: 82 Ohm
Implantable Lead Implant Date: 20190829
Implantable Lead Implant Date: 20190829
Implantable Lead Implant Date: 20190829
Implantable Lead Location: 753858
Implantable Lead Location: 753859
Implantable Lead Location: 753860
Implantable Lead Model: 292
Implantable Lead Model: 4674
Implantable Lead Model: 7740
Implantable Lead Serial Number: 446776
Implantable Lead Serial Number: 696999
Implantable Lead Serial Number: 827497
Implantable Pulse Generator Implant Date: 20190829
Lead Channel Impedance Value: 1085 Ohm
Lead Channel Impedance Value: 394 Ohm
Lead Channel Impedance Value: 533 Ohm
Lead Channel Pacing Threshold Amplitude: 0.7 V
Lead Channel Pacing Threshold Amplitude: 0.8 V
Lead Channel Pacing Threshold Pulse Width: 0.4 ms
Lead Channel Pacing Threshold Pulse Width: 0.4 ms
Lead Channel Sensing Intrinsic Amplitude: 3.2 mV
Lead Channel Sensing Intrinsic Amplitude: 7.2 mV
Lead Channel Sensing Intrinsic Amplitude: 8.2 mV
Lead Channel Setting Pacing Amplitude: 2 V
Lead Channel Setting Pacing Amplitude: 2 V
Lead Channel Setting Pacing Pulse Width: 0.4 ms
Lead Channel Setting Pacing Pulse Width: 0.4 ms
Lead Channel Setting Sensing Sensitivity: 0.5 mV
Lead Channel Setting Sensing Sensitivity: 1 mV
Pulse Gen Serial Number: 215092

## 2018-09-06 NOTE — Telephone Encounter (Signed)
Patient has upcoming heart ablation scheduled for July 1st. Has been suffering from congestion, thinks its allergy related.   Patient instructed to take OTC allergy meds, along with mucinex and allergy nasal sprays. Will follow up in a couple of days if not feeling any better.

## 2018-09-11 ENCOUNTER — Telehealth: Payer: Self-pay | Admitting: Internal Medicine

## 2018-09-11 ENCOUNTER — Other Ambulatory Visit (HOSPITAL_COMMUNITY)
Admission: RE | Admit: 2018-09-11 | Discharge: 2018-09-11 | Disposition: A | Discharge status: Home / Self Care | Payer: Medicare Other | Source: Ambulatory Visit | Attending: Internal Medicine | Admitting: Internal Medicine

## 2018-09-11 DIAGNOSIS — Z01812 Encounter for preprocedural laboratory examination: Secondary | ICD-10-CM | POA: Diagnosis not present

## 2018-09-11 DIAGNOSIS — Z1159 Encounter for screening for other viral diseases: Secondary | ICD-10-CM | POA: Insufficient documentation

## 2018-09-11 LAB — SARS CORONAVIRUS 2 (TAT 6-24 HRS): SARS Coronavirus 2: NEGATIVE

## 2018-09-11 NOTE — Telephone Encounter (Signed)
Returned call and left detailed message.  Pt should be getting tested for covid this am.  If negative and Pt does not have a fever advised Pt she could call her PCP if she felt what she has requires on antibiotic.  Or we can reschedule Pt.  Advised her to call office if she wants to reschedule.

## 2018-09-11 NOTE — Telephone Encounter (Signed)
Pt returned call.  Per Pt she has a slight case of bronchitis.  She is going to call her PCP about getting a zpak.  Advised to call this nurse tomorrow if she feels worse or develops a fever.  Pt just went for her covid test.

## 2018-09-11 NOTE — Telephone Encounter (Signed)
Patient called again she states she is getting sick she wants to know if she needs to be on antibiotics prior to her surgery.

## 2018-09-11 NOTE — Telephone Encounter (Signed)
Patient called wanting to speak to nurse about procedure.

## 2018-09-13 ENCOUNTER — Encounter (HOSPITAL_COMMUNITY)
Admission: RE | Disposition: A | Discharge status: Home / Self Care | Payer: Self-pay | Source: Home / Self Care | Attending: Internal Medicine

## 2018-09-13 ENCOUNTER — Ambulatory Visit (HOSPITAL_COMMUNITY)
Admission: RE | Admit: 2018-09-13 | Discharge: 2018-09-13 | Disposition: A | Discharge status: Home / Self Care | Payer: Medicare Other | Attending: Internal Medicine | Admitting: Internal Medicine

## 2018-09-13 ENCOUNTER — Other Ambulatory Visit: Payer: Self-pay

## 2018-09-13 DIAGNOSIS — Z885 Allergy status to narcotic agent status: Secondary | ICD-10-CM | POA: Diagnosis not present

## 2018-09-13 DIAGNOSIS — Z7901 Long term (current) use of anticoagulants: Secondary | ICD-10-CM | POA: Diagnosis not present

## 2018-09-13 DIAGNOSIS — I4891 Unspecified atrial fibrillation: Secondary | ICD-10-CM | POA: Diagnosis not present

## 2018-09-13 DIAGNOSIS — E785 Hyperlipidemia, unspecified: Secondary | ICD-10-CM | POA: Diagnosis not present

## 2018-09-13 DIAGNOSIS — I110 Hypertensive heart disease with heart failure: Secondary | ICD-10-CM | POA: Diagnosis not present

## 2018-09-13 DIAGNOSIS — Z823 Family history of stroke: Secondary | ICD-10-CM | POA: Diagnosis not present

## 2018-09-13 DIAGNOSIS — Z79899 Other long term (current) drug therapy: Secondary | ICD-10-CM | POA: Insufficient documentation

## 2018-09-13 DIAGNOSIS — M1990 Unspecified osteoarthritis, unspecified site: Secondary | ICD-10-CM | POA: Diagnosis not present

## 2018-09-13 DIAGNOSIS — Z7982 Long term (current) use of aspirin: Secondary | ICD-10-CM | POA: Diagnosis not present

## 2018-09-13 DIAGNOSIS — Z8679 Personal history of other diseases of the circulatory system: Secondary | ICD-10-CM | POA: Diagnosis not present

## 2018-09-13 DIAGNOSIS — Z79890 Hormone replacement therapy (postmenopausal): Secondary | ICD-10-CM | POA: Diagnosis not present

## 2018-09-13 DIAGNOSIS — E039 Hypothyroidism, unspecified: Secondary | ICD-10-CM | POA: Insufficient documentation

## 2018-09-13 DIAGNOSIS — M109 Gout, unspecified: Secondary | ICD-10-CM | POA: Insufficient documentation

## 2018-09-13 DIAGNOSIS — Z95828 Presence of other vascular implants and grafts: Secondary | ICD-10-CM | POA: Insufficient documentation

## 2018-09-13 DIAGNOSIS — I739 Peripheral vascular disease, unspecified: Secondary | ICD-10-CM | POA: Diagnosis not present

## 2018-09-13 DIAGNOSIS — J449 Chronic obstructive pulmonary disease, unspecified: Secondary | ICD-10-CM | POA: Diagnosis not present

## 2018-09-13 DIAGNOSIS — Z87891 Personal history of nicotine dependence: Secondary | ICD-10-CM | POA: Insufficient documentation

## 2018-09-13 DIAGNOSIS — Z8249 Family history of ischemic heart disease and other diseases of the circulatory system: Secondary | ICD-10-CM | POA: Insufficient documentation

## 2018-09-13 DIAGNOSIS — Z8701 Personal history of pneumonia (recurrent): Secondary | ICD-10-CM | POA: Insufficient documentation

## 2018-09-13 DIAGNOSIS — I5022 Chronic systolic (congestive) heart failure: Secondary | ICD-10-CM | POA: Insufficient documentation

## 2018-09-13 DIAGNOSIS — Z881 Allergy status to other antibiotic agents status: Secondary | ICD-10-CM | POA: Diagnosis not present

## 2018-09-13 DIAGNOSIS — M8580 Other specified disorders of bone density and structure, unspecified site: Secondary | ICD-10-CM | POA: Diagnosis not present

## 2018-09-13 DIAGNOSIS — K219 Gastro-esophageal reflux disease without esophagitis: Secondary | ICD-10-CM | POA: Insufficient documentation

## 2018-09-13 DIAGNOSIS — Z888 Allergy status to other drugs, medicaments and biological substances status: Secondary | ICD-10-CM | POA: Diagnosis not present

## 2018-09-13 HISTORY — PX: AV NODE ABLATION: EP1193

## 2018-09-13 SURGERY — AV NODE ABLATION

## 2018-09-13 MED ORDER — MIDAZOLAM HCL 5 MG/5ML IJ SOLN
INTRAMUSCULAR | Status: AC
  Filled 2018-09-13: qty 5

## 2018-09-13 MED ORDER — FENTANYL CITRATE (PF) 100 MCG/2ML IJ SOLN
INTRAMUSCULAR | Status: AC
  Filled 2018-09-13: qty 2

## 2018-09-13 MED ORDER — ACETAMINOPHEN 325 MG PO TABS
650.0000 mg | ORAL_TABLET | ORAL | Status: DC | PRN
  Administered 2018-09-13: 14:00:00 650 mg via ORAL
  Filled 2018-09-13: qty 2

## 2018-09-13 MED ORDER — BUPIVACAINE HCL (PF) 0.25 % IJ SOLN
INTRAMUSCULAR | Status: DC | PRN
  Administered 2018-09-13: 30 mL

## 2018-09-13 MED ORDER — SODIUM CHLORIDE 0.9 % IV SOLN
INTRAVENOUS | Status: DC
  Administered 2018-09-13: 12:00:00 via INTRAVENOUS

## 2018-09-13 MED ORDER — SODIUM CHLORIDE 0.9% FLUSH: 3.0000 mL | INTRAVENOUS | Status: DC | PRN

## 2018-09-13 MED ORDER — ONDANSETRON HCL 4 MG/2ML IJ SOLN: 4.0000 mg | Freq: Four times a day (QID) | INTRAMUSCULAR | Status: DC | PRN

## 2018-09-13 MED ORDER — BUPIVACAINE HCL (PF) 0.25 % IJ SOLN
INTRAMUSCULAR | Status: AC
  Filled 2018-09-13: qty 30

## 2018-09-13 MED ORDER — HEPARIN (PORCINE) IN NACL 1000-0.9 UT/500ML-% IV SOLN
INTRAVENOUS | Status: AC
  Filled 2018-09-13: qty 500

## 2018-09-13 MED ORDER — MIDAZOLAM HCL 5 MG/5ML IJ SOLN
INTRAMUSCULAR | Status: DC | PRN
  Administered 2018-09-13: 1 mg via INTRAVENOUS

## 2018-09-13 MED ORDER — FENTANYL CITRATE (PF) 100 MCG/2ML IJ SOLN
INTRAMUSCULAR | Status: DC | PRN
  Administered 2018-09-13: 12.5 ug via INTRAVENOUS

## 2018-09-13 MED ORDER — LIDOCAINE HCL (PF) 1 % IJ SOLN: INTRAMUSCULAR | Status: DC | PRN

## 2018-09-13 MED ORDER — HEPARIN (PORCINE) IN NACL 1000-0.9 UT/500ML-% IV SOLN
INTRAVENOUS | Status: DC | PRN
  Administered 2018-09-13: 500 mL

## 2018-09-13 MED ORDER — ACETAMINOPHEN 325 MG PO TABS
ORAL_TABLET | ORAL | Status: AC
  Filled 2018-09-13: qty 2

## 2018-09-13 MED ORDER — SODIUM CHLORIDE 0.9% FLUSH: 3.0000 mL | Freq: Two times a day (BID) | INTRAVENOUS | Status: DC

## 2018-09-13 MED ORDER — SODIUM CHLORIDE 0.9 % IV SOLN: 250.0000 mL | INTRAVENOUS | Status: DC | PRN

## 2018-09-13 MED ORDER — GABAPENTIN 300 MG PO CAPS
600.0000 mg | ORAL_CAPSULE | Freq: Once | ORAL | Status: AC
  Administered 2018-09-13: 16:00:00 600 mg via ORAL
  Filled 2018-09-13: qty 2

## 2018-09-13 SURGICAL SUPPLY — 5 items
CATH CELSIUS THERMO F CV 7FR (ABLATOR) ×2 IMPLANT
PACK EP LATEX FREE (CUSTOM PROCEDURE TRAY) ×3
PACK EP LF (CUSTOM PROCEDURE TRAY) ×1 IMPLANT
PAD PRO RADIOLUCENT 2001M-C (PAD) ×3 IMPLANT
SHEATH PINNACLE 8F 10CM (SHEATH) ×2 IMPLANT

## 2018-09-13 NOTE — Progress Notes (Signed)
Up and walked and tolerated well; right groin stable, no bleeding or hematoma 

## 2018-09-13 NOTE — Discharge Instructions (Signed)
Femoral Site Care °This sheet gives you information about how to care for yourself after your procedure. Your health care provider may also give you more specific instructions. If you have problems or questions, contact your health care provider. °What can I expect after the procedure? °After the procedure, it is common to have: °· Bruising that usually fades within 1-2 weeks. °· Tenderness at the site. °Follow these instructions at home: °Wound care °· Follow instructions from your health care provider about how to take care of your insertion site. Make sure you: °? Wash your hands with soap and water before you change your bandage (dressing). If soap and water are not available, use hand sanitizer. °? Change your dressing as told by your health care provider. °? Leave stitches (sutures), skin glue, or adhesive strips in place. These skin closures may need to stay in place for 2 weeks or longer. If adhesive strip edges start to loosen and curl up, you may trim the loose edges. Do not remove adhesive strips completely unless your health care provider tells you to do that. °· Do not take baths, swim, or use a hot tub until your health care provider approves. °· You may shower 24-48 hours after the procedure or as told by your health care provider. °? Gently wash the site with plain soap and water. °? Pat the area dry with a clean towel. °? Do not rub the site. This may cause bleeding. °· Do not apply powder or lotion to the site. Keep the site clean and dry. °· Check your femoral site every day for signs of infection. Check for: °? Redness, swelling, or pain. °? Fluid or blood. °? Warmth. °? Pus or a bad smell. °Activity °· For the first 2-3 days after your procedure, or as long as directed: °? Avoid climbing stairs as much as possible. °? Do not squat. °· Do not lift anything that is heavier than 10 lb (4.5 kg), or the limit that you are told, until your health care provider says that it is safe. °· Rest as  directed. °? Avoid sitting for a long time without moving. Get up to take short walks every 1-2 hours. °· Do not drive for 24 hours if you were given a medicine to help you relax (sedative). °General instructions °· Take over-the-counter and prescription medicines only as told by your health care provider. °· Keep all follow-up visits as told by your health care provider. This is important. °Contact a health care provider if you have: °· A fever or chills. °· You have redness, swelling, or pain around your insertion site. °Get help right away if: °· The catheter insertion area swells very fast. °· You pass out. °· You suddenly start to sweat or your skin gets clammy. °· The catheter insertion area is bleeding, and the bleeding does not stop when you hold steady pressure on the area. °· The area near or just beyond the catheter insertion site becomes pale, cool, tingly, or numb. °These symptoms may represent a serious problem that is an emergency. Do not wait to see if the symptoms will go away. Get medical help right away. Call your local emergency services (911 in the [place] Do not drive yourself to the hospital. °Summary °· After the procedure, it is common to have bruising that usually fades within 1-2 weeks. °· Check your femoral site every day for signs of infection. °· Do not lift anything that is heavier than 10 lb (4.5 kg), or the   limit that you are told, until your health care provider says that it is safe. This information is not intended to replace advice given to you by your health care provider. Make sure you discuss any questions you have with your health care provider. Document Released: 11/02/2013 Document Revised: 03/14/2017 Document Reviewed: 03/14/2017 Elsevier Patient Education  [place].    Moderate Conscious Sedation, Adult, Care After These instructions provide you with information about caring for yourself after your procedure. Your health care provider may also give you  more specific instructions. Your treatment has been planned according to current medical practices, but problems sometimes occur. Call your health care provider if you have any problems or questions after your procedure. What can I expect after the procedure? After your procedure, it is common:  To feel sleepy for several hours.  To feel clumsy and have poor balance for several hours.  To have poor judgment for several hours.  To vomit if you eat too soon. Follow these instructions at home: For at least 24 hours after the procedure:   Do not: ? Participate in activities where you could fall or become injured. ? Drive. ? Use heavy machinery. ? Drink alcohol. ? Take sleeping pills or medicines that cause drowsiness. ? Make important decisions or sign legal documents. ? Take care of children on your own.  Rest. Eating and drinking  Follow the diet recommended by your health care provider.  If you vomit: ? Drink water, juice, or soup when you can drink without vomiting. ? Make sure you have little or no nausea before eating solid foods. General instructions  Have a responsible adult stay with you until you are awake and alert.  Take over-the-counter and prescription medicines only as told by your health care provider.  If you smoke, do not smoke without supervision.  Keep all follow-up visits as told by your health care provider. This is important. Contact a health care provider if:  You keep feeling nauseous or you keep vomiting.  You feel light-headed.  You develop a rash.  You have a fever. Get help right away if:  You have trouble breathing. This information is not intended to replace advice given to you by your health care provider. Make sure you discuss any questions you have with your health care provider. Document Released: 12/20/2012 Document Revised: 02/11/2017 Document Reviewed: 06/21/2015 Elsevier Patient Education  2020 [place] procedure  care instructions No driving for 4 days. No lifting over 5 lbs for 1 week. No vigorous or sexual activity for 1 week. You may return to work on 09/20/2018. Keep procedure site clean & dry. If you notice increased pain, swelling, bleeding or pus, call/return!  You may shower, but no soaking baths/hot tubs/pools for 1 week.

## 2018-09-13 NOTE — Interval H&P Note (Signed)
History and Physical Interval Note:  09/13/2018 10:45 AM  Katie [name]  has presented today for surgery, with the diagnosis of av.  The various methods of treatment have been discussed with the patient and family. After consideration of risks, benefits and other options for treatment, the patient has consented to  Procedure(s): AV NODE ABLATION (N/A) as a surgical intervention.  The patient's history has been reviewed, patient examined, no change in status, stable for surgery.  I have reviewed the patient's chart and labs.  Questions were answered to the patient's satisfaction.     Cristopher Peru

## 2018-09-13 NOTE — Progress Notes (Addendum)
Site area: Right groin a 8 french venous sheath was removed  Site Prior to Removal:  Level 0  Pressure Applied For 35 MINUTES    Bedrest Beginning at 1440p  Manual:   Yes.    Patient Status During Pull:  stable  Post Pull Groin Site:  Level 0  Post Pull Instructions Given:  Yes.    Post Pull Pulses Present:  Yes.    Dressing Applied:  Yes.    Comments:

## 2018-09-14 ENCOUNTER — Encounter (HOSPITAL_COMMUNITY): Payer: Self-pay | Admitting: Internal Medicine

## 2018-09-14 DIAGNOSIS — J449 Chronic obstructive pulmonary disease, unspecified: Secondary | ICD-10-CM | POA: Diagnosis not present

## 2018-09-18 ENCOUNTER — Encounter: Payer: Self-pay | Admitting: *Deleted

## 2018-09-18 ENCOUNTER — Other Ambulatory Visit: Payer: Self-pay | Admitting: *Deleted

## 2018-09-18 NOTE — Patient Outreach (Signed)
[facility]RA call and engagement for monthly health check ups for 3 months. Focus on HTN, AFIB, CHF and COPD.  Pt lives with husband (disabled from CVA). Daughter lives close by for support.   Pt just had an ablation last week and is doing well. She had a defibrillator and she has been told that the rate will need to be adjusted. This will be done next week on 09/28/2018.  Pt recently had 2 medications discontinued toprol and imdur due to hypotension. No dizziness now and feeling a bit more energetic.  Pt knows her medications well.  She takes her blood pressure twice a day. She weighs daily and knows when to call her MD for instructions. She uses her COPD medications regularly and prn.  She does not have an advanced directive, HCPOA or a MOST form and she has asked for these.  Medications Reviewed Today    Reviewed by Deloria Lair, NP (Nurse Practitioner) on 09/18/18 at 1313  Med List Status: <None>  Medication Order Taking? Sig Documenting Provider Last Dose Status Informant  acetaminophen (TYLENOL) 500 MG tablet [id number] Yes Take 1,000 mg by mouth every 6 (six) hours as needed for moderate pain.  [provider] Taking Active Self  albuterol (PROAIR HFA) 108 (90 Base) MCG/ACT inhaler [id number] Yes Use 2 Inhalations 15 minutes Apart every 4 hours to Rescue Asthma Attack  Patient taking differently: Inhale 2 puffs into the lungs every 4 (four) hours as needed for wheezing or shortness of breath.    [name], MD Taking Active Self  allopurinol (ZYLOPRIM) 300 MG tablet [id number] Yes TAKE 1 TABLET BY MOUTH EVERY DAY  Patient taking differently: Take 300 mg by mouth daily.    [name], MD Taking Active Self  aspirin EC 81 MG tablet [id number] Yes Take 81 mg by mouth daily. [provider] Taking Active Self  cetirizine (ZYRTEC) 10 MG tablet [id number] Yes Take 10 mg by mouth daily as needed for allergies.  [provider] Taking Active Self   Cholecalciferol (VITAMIN D3) 5000 units CAPS [id number] Yes Take 5,000 Units by mouth daily.  [provider] Taking Active Self  digoxin (LANOXIN) 0.125 MG tablet [id number] Yes Take 1 tablet (0.125 mg total) by mouth daily. Garnet Sierras, NP Taking Active Self           Med Note Nash Mantis, TIFFANI S   Tue May 23, 2018  9:31 AM)    Eyelid Cleansers (AVENOVA) 0.01 % SOLN [id number] Yes Apply 1 application topically See admin instructions. Wash eyelids twice daily with cleaner [provider] Taking Active Self  gabapentin (NEURONTIN) 600 MG tablet [id number] Yes Take 1 tablet 3 x /Day for Pain  Patient taking differently: Take 600 mg by mouth 2 (two) times daily.    [name], MD Taking Active Self  guaiFENesin (MUCINEX) 600 MG 12 hr tablet [id number] Yes Take 600 mg by mouth daily as needed for cough or to loosen phlegm.  [provider] Taking Active Self  ipratropium (ATROVENT) 0.06 % nasal spray [id number] Yes Use 1 to 2 sprays each Nostril 2 to 3 x / day as needed  Patient taking differently: Place 1-2 sprays into both nostrils 3 (three) times daily as needed for rhinitis.    [name], MD Taking Active Self  ipratropium-albuterol (DUONEB) 0.5-2.5 (3) MG/3ML SOLN [id number] Yes Inhale 3 mLs into the lungs every 6 (six) hours as needed.  Patient taking differently: Inhale 3 mLs into the lungs every 6 (six) hours  as needed (for shortness of breath or wheezing).    [name], MD Taking Active Self       Patient not taking:      Discontinued 09/18/18 1310 (Discontinued by provider)            Med Note Berline Lopes, MELISSA   Wed Aug 23, 2018  2:00 PM)    leflunomide (ARAVA) 10 MG tablet [id number] Yes Take 10 mg by mouth daily.  [provider] Taking Active Self  levothyroxine (SYNTHROID) 100 MCG tablet [id number] Yes TAKE 1 TABLET DAILY ON AN EMPTY STOMACH WITH ONLY WATER FOR 30 MINUTES.  Patient taking differently: Take 100 mcg by mouth daily  before breakfast.    [name], MD Taking Active Self           Med Note [facility], Napoleon Monacelli   Mon Sep 18, 2018  1:11 PM) Pt says MD instructed her to take this at bedtime. Advised usually this is taken in the morning on an empty stomach 30 minutes before food.  montelukast (SINGULAIR) 10 MG tablet [id number] Yes Take 1 tablet daily for Allergies & Asthma  Patient taking differently: Take 10 mg by mouth at bedtime.    [name], MD Taking Active Self  pantoprazole (PROTONIX) 40 MG tablet [id number] Yes Take 1 tablet (40 mg total) by mouth daily. Jonetta Osgood, MD Taking Active Self  Polyethyl Glycol-Propyl Glycol (SYSTANE) 0.4-0.3 % SOLN [id number] Yes Place 1 drop into both eyes 4 (four) times daily as needed (for dry eyes).  [provider] Taking Active Self  potassium chloride SA (KLOR-CON M20) 20 MEQ tablet [id number] Yes TAKE 2 TABLETS BY MOUTH DAILY  Patient taking differently: Take 40 mEq by mouth daily.    [name], MD Taking Active Self  pravastatin (PRAVACHOL) 40 MG tablet [id number] Yes TAKE 1/2 TABLET BY MOUTH AT BEDTIME FOR CHOLESTEROL  Patient taking differently: Take 20 mg by mouth at bedtime.    Vicie Mutters, PA-C Taking Active Self  prednisoLONE acetate (PRED FORTE) 1 % ophthalmic suspension [id number] Yes Place 1 drop into both eyes 2 (two) times daily.  [provider] Taking Active Self  prochlorperazine (COMPAZINE) 5 MG tablet [id number] No TAKE 1 TABLET BY MOUTH THREE TIMES A DAY AS NEEDED FOR VERTIGO OR NAUSEA  Patient not taking: No sig reported   [name], MD Not Taking Active Self  Rivaroxaban (XARELTO) 15 MG TABS tablet [id number] Yes Take 1 tablet Daily for A fib  Patient taking differently: Take 15 mg by mouth daily with supper.    [name], MD Taking Active Self  torsemide [facility]) 20 MG tablet [id number] Yes Take 2 tablets (40 mg total) by mouth 2 (two) times daily. Evans Lance, MD Taking Active Self   triamcinolone cream (KENALOG) 0.1 % [id number] Yes Apply 1 application topically daily as needed (for dry skin).  [provider] Taking Active Self  umeclidinium-vilanterol (ANORO ELLIPTA) 62.5-25 MCG/INH AEPB [id number] Yes Inhale 1 puff into the lungs daily. [provider] Taking Active Self         Fall Risk  09/18/2018 07/14/2018 07/08/2018 06/13/2018 06/04/2018  Falls in the past year? 0 0 0 0 0  Number falls in past yr: 0 - - - -  Injury with Fall? 0 - - - -  Risk for fall due to : Medication side effect - - - -  Follow up Falls evaluation completed - - - -   Depression screen [facility] 2/9 09/18/2018 07/14/2018  07/08/2018 06/13/2018 06/04/2018  Decreased Interest 0 0 0 0 0  Down, Depressed, Hopeless 0 0 0 0 0  PHQ - 2 Score 0 0 0 0 0  Some recent data might be hidden     Pt has agreed to monthly phone calls for 3 months. Care plan focus on completing advanced directives and chronic disease monitoring.  [name]. [name], MSN, [facility] Gerontological Nurse Practitioner [facility] Care Management [phone]

## 2018-09-21 ENCOUNTER — Other Ambulatory Visit: Payer: Self-pay

## 2018-09-21 ENCOUNTER — Ambulatory Visit (INDEPENDENT_AMBULATORY_CARE_PROVIDER_SITE_OTHER): Payer: Medicare Other | Admitting: Adult Health

## 2018-09-21 ENCOUNTER — Encounter: Payer: Self-pay | Admitting: Adult Health

## 2018-09-21 ENCOUNTER — Ambulatory Visit
Admission: RE | Admit: 2018-09-21 | Discharge: 2018-09-21 | Disposition: A | Discharge status: Home / Self Care | Payer: Medicare Other | Source: Ambulatory Visit | Attending: Adult Health | Admitting: Adult Health

## 2018-09-21 VITALS — BP 142/70 | HR 83 | Temp 97.7°F | Wt 205.0 lb

## 2018-09-21 DIAGNOSIS — R05 Cough: Secondary | ICD-10-CM | POA: Diagnosis not present

## 2018-09-21 DIAGNOSIS — R0602 Shortness of breath: Secondary | ICD-10-CM | POA: Diagnosis not present

## 2018-09-21 DIAGNOSIS — R058 Other specified cough: Secondary | ICD-10-CM

## 2018-09-21 MED ORDER — LEVOFLOXACIN 750 MG PO TABS: 750.0000 mg | ORAL_TABLET | ORAL | 0 refills | Status: AC

## 2018-09-21 NOTE — Patient Instructions (Signed)
Increase guaifenasin to twice daily     Chronic Obstructive Pulmonary Disease Chronic obstructive pulmonary disease (COPD) is a long-term (chronic) lung problem. When you have COPD, it is hard for air to get in and out of your lungs. Usually the condition gets worse over time, and your lungs will never return to normal. There are things you can do to keep yourself as healthy as possible.  Your doctor may treat your condition with: ? Medicines. ? Oxygen. ? Lung surgery.  Your doctor may also recommend: ? Rehabilitation. This includes steps to make your body work better. It may involve a team of specialists. ? Quitting smoking, if you smoke. ? Exercise and changes to your diet. ? Comfort measures (palliative care). Follow these instructions at home: Medicines  Take over-the-counter and prescription medicines only as told by your doctor.  Talk to your doctor before taking any cough or allergy medicines. You may need to avoid medicines that cause your lungs to be dry. Lifestyle  If you smoke, stop. Smoking makes the problem worse. If you need help quitting, ask your doctor.  Avoid being around things that make your breathing worse. This may include smoke, chemicals, and fumes.  Stay active, but remember to rest as well.  Learn and use tips on how to relax.  Make sure you get enough sleep. Most adults need at least 7 hours of sleep every night.  Eat healthy foods. Eat smaller meals more often. Rest before meals. Controlled breathing Learn and use tips on how to control your breathing as told by your doctor. Try:  Breathing in (inhaling) through your nose for 1 second. Then, pucker your lips and breath out (exhale) through your lips for 2 seconds.  Putting one hand on your belly (abdomen). Breathe in slowly through your nose for 1 second. Your hand on your belly should move out. Pucker your lips and breathe out slowly through your lips. Your hand on your belly should move in as  you breathe out.  Controlled coughing Learn and use controlled coughing to clear mucus from your lungs. Follow these steps: 1. Lean your head a little forward. 2. Breathe in deeply. 3. Try to hold your breath for 3 seconds. 4. Keep your mouth slightly open while coughing 2 times. 5. Spit any mucus out into a tissue. 6. Rest and do the steps again 1 or 2 times as needed. General instructions  Make sure you get all the shots (vaccines) that your doctor recommends. Ask your doctor about a flu shot and a pneumonia shot.  Use oxygen therapy and pulmonary rehabilitation if told by your doctor. If you need home oxygen therapy, ask your doctor if you should buy a tool to measure your oxygen level (oximeter).  Make a COPD action plan with your doctor. This helps you to know what to do if you feel worse than usual.  Manage any other conditions you have as told by your doctor.  Avoid going outside when it is very hot, cold, or humid.  Avoid people who have a sickness you can catch (contagious).  Keep all follow-up visits as told by your doctor. This is important. Contact a doctor if:  You cough up more mucus than usual.  There is a change in the color or thickness of the mucus.  It is harder to breathe than usual.  Your breathing is faster than usual.  You have trouble sleeping.  You need to use your medicines more often than usual.  You have  trouble doing your normal activities such as getting dressed or walking around the house. Get help right away if:  You have shortness of breath while resting.  You have shortness of breath that stops you from: ? Being able to talk. ? Doing normal activities.  Your chest hurts for longer than 5 minutes.  Your skin color is more blue than usual.  Your pulse oximeter shows that you have low oxygen for longer than 5 minutes.  You have a fever.  You feel too tired to breathe normally. Summary  Chronic obstructive pulmonary disease  (COPD) is a long-term lung problem.  The way your lungs work will never return to normal. Usually the condition gets worse over time. There are things you can do to keep yourself as healthy as possible.  Take over-the-counter and prescription medicines only as told by your doctor.  If you smoke, stop. Smoking makes the problem worse. This information is not intended to replace advice given to you by your health care provider. Make sure you discuss any questions you have with your health care provider. Document Released: 08/18/2007 Document Revised: 02/11/2017 Document Reviewed: 04/05/2016 Elsevier Patient Education  2020 [employer].

## 2018-09-21 NOTE — Progress Notes (Signed)
Assessment and Plan:  Katie [name] was seen today for shortness of breath.  Diagnoses and all orders for this visit:  Shortness of breath/Productive cough COPD flare vs early hospital acquired pneumonia; will obtain CXR to confirm Continue inhalers, increase guaifenasin Continue to monitor edema, weights closely, continue diuretic as currently taking, continue to follow sodium and fluid restrictions per cardiology  Initiate levaquin as prescribed which is indicated for either COPD flare or hospital acquired pneumonia She has benzonatate if needed, declines cough medication Continue neb PRN q6 hours Strict ER precautions given over the weekend Follow up if not improving -     DG Chest 2 View; Future -     levofloxacin (LEVAQUIN) 750 MG tablet; Take 1 tablet (750 mg total) by mouth every other day for 8 days.  Further disposition pending results of labs. Discussed med's effects and SE's.   Over 15 minutes of exam, counseling, chart review, and critical decision making was performed.   Future Appointments  Date Time Provider [place]  09/21/2018 12:55 PM [facility] DG DR ROOM 4 [facility]  09/28/2018 10:00 AM [facility] DEVICE 1 [facility]  10/02/2018  9:00 AM Mcarthur Rossetti, MD [facility] None  10/13/2018 10:45 AM Evans Lance, MD [facility]  10/23/2018  9:30 AM Deloria Lair, NP [facility] None  11/13/2018  7:35 AM [facility] DEVICE REMOTES [facility]  11/23/2018 11:30 AM Liane Comber, NP [facility] None  02/27/2019  9:00 AM [name], MD [facility] None  07/31/2019  9:00 AM Vicie Mutters, PA-C [facility] None    ------------------------------------------------------------------------------------------------------------------   HPI BP (!) 142/70   Pulse 83   Temp 97.7 F (36.5 C)   Wt 205 lb (93 kg)   SpO2 96%   BMI 35.19 kg/m   78 y.o.female with complex cardiopulmonary history including a. Fib, CHF, PAH,  COPD/pulmonary fibrosis, s/p AICD implantation last year presents for evaluation of dyspnea; she recently underwent AV node ablation by Dr. Lovena Le on 09/13/2018. She was recently on zpak in 08/2018.   She believes she has bronchitis; she reports symptoms began 2 days ago, started with new dry cough, sense of worsening dyspnea (does have some at baseline), has been waking her up at night and has been moving to the recliner. She reports cough has been progressive, now is productive of thick yellow secretions, she also has noted some mild R lower chest aching intermittently. Denies fever/chills, she does endorse fatigue but this is ongoing since she had surgery. Denies wheezing, chest tightness, chest pain, sore throat, headache, sinus pressure.   No known sick contacts, hasn't been out in the community at all. Husband doesn't have symptoms.   She has been taking anoro daily - has neb with albuterol and duoneb if needed, has been using 4 times a day, prior was only using occasionally. She is on singular, zyrtec, atrovent nasal spray, guaifenasin 600 mg once daily, has tessalon if needed but hasn't felt the need to take.   She has a history of Diastolic CHF (09/5641 ECHO LVEF 55-60%), denies edema. Positive for dyspnea, fatigue, orthopnea and tachypnea. She has been weighing daily, 198-200 lb consistently at home which is her baseline. She has been stable on torsemide 40 mg BID for several months now.  Wt Readings from Last 3 Encounters:  09/21/18 205 lb (93 kg)  09/13/18 200 lb (90.7 kg)  09/05/18 206 lb 6.4 oz (93.6 kg)     Lab Results  Component Value Date   GFRNONAA 32 (L) 09/05/2018  Lab Results  Component Value Date   CREATININE 1.56 (H) 09/05/2018     Past Medical History:  Diagnosis Date  . AAA (abdominal aortic aneurysm) ([place])   . AICD (automatic cardioverter/defibrillator) present 11/10/2017  . Arthritis    "some in my knees" (11/10/2017)  . Atrial fibrillation ([place])   . CHF  (congestive heart failure) ([place])   . Chronic bronchitis ([place])   . COPD (chronic obstructive pulmonary disease) ([place])    'they say I don't; have a problem breathing though" (11/10/2017)  . Depression   . GERD (gastroesophageal reflux disease)   . Gout    "daily RX" (11/10/2017)  . History of hiatal hernia   . Hyperlipidemia   . Hypertension   . Hypothyroid   . Migraine headache    "hx; none since 1980s" (11/10/2017)  . Osteopenia   . Pneumonia    "~ 3 times" (11/10/2017)  . PVD (peripheral vascular disease) ([facility])      Allergies  Allergen Reactions  . Amiodarone Other (See Comments)    PULMONARY TOXICITY  . Diovan [Valsartan] Other (See Comments)    HYPOTENSION  . Doxycycline Diarrhea and Other (See Comments)    VISUAL DISTURBANCE  . Flexeril [Cyclobenzaprine] Other (See Comments)    FATIGUE  . Keflex [Cephalexin] Diarrhea  . Verapamil Other (See Comments)    EDEMA  . Codeine Hives    Current Outpatient Medications on File Prior to Visit  Medication Sig  . acetaminophen (TYLENOL) 500 MG tablet Take 1,000 mg by mouth every 6 (six) hours as needed for moderate pain.   Marland Kitchen albuterol (PROAIR HFA) 108 (90 Base) MCG/ACT inhaler Use 2 Inhalations 15 minutes Apart every 4 hours to Rescue Asthma Attack (Patient taking differently: Inhale 2 puffs into the lungs every 4 (four) hours as needed for wheezing or shortness of breath. )  . allopurinol (ZYLOPRIM) 300 MG tablet TAKE 1 TABLET BY MOUTH EVERY DAY (Patient taking differently: Take 300 mg by mouth daily. )  . aspirin EC 81 MG tablet Take 81 mg by mouth daily.  . cetirizine (ZYRTEC) 10 MG tablet Take 10 mg by mouth daily as needed for allergies.   . Cholecalciferol (VITAMIN D3) 5000 units CAPS Take 5,000 Units by mouth daily.   . digoxin (LANOXIN) 0.125 MG tablet Take 1 tablet (0.125 mg total) by mouth daily.  . Eyelid Cleansers (AVENOVA) 0.01 % SOLN Apply 1 application topically See admin instructions. Wash eyelids twice daily with  cleaner  . gabapentin (NEURONTIN) 600 MG tablet Take 1 tablet 3 x /Day for Pain (Patient taking differently: Take 600 mg by mouth 2 (two) times daily. )  . guaiFENesin (MUCINEX) 600 MG 12 hr tablet Take 600 mg by mouth daily as needed for cough or to loosen phlegm.   Marland Kitchen ipratropium (ATROVENT) 0.06 % nasal spray Use 1 to 2 sprays each Nostril 2 to 3 x / day as needed (Patient taking differently: Place 1-2 sprays into both nostrils 3 (three) times daily as needed for rhinitis. )  . ipratropium-albuterol (DUONEB) 0.5-2.5 (3) MG/3ML SOLN Inhale 3 mLs into the lungs every 6 (six) hours as needed. (Patient taking differently: Inhale 3 mLs into the lungs every 6 (six) hours as needed (for shortness of breath or wheezing). )  . leflunomide (ARAVA) 10 MG tablet Take 10 mg by mouth daily.   Marland Kitchen levothyroxine (SYNTHROID) 100 MCG tablet TAKE 1 TABLET DAILY ON AN EMPTY STOMACH WITH ONLY WATER FOR 30 MINUTES. (Patient taking differently: Take 100  mcg by mouth daily before breakfast. )  . montelukast (SINGULAIR) 10 MG tablet Take 1 tablet daily for Allergies & Asthma (Patient taking differently: Take 10 mg by mouth at bedtime. )  . pantoprazole (PROTONIX) 40 MG tablet Take 1 tablet (40 mg total) by mouth daily.  [name] Glycol-Propyl Glycol (SYSTANE) 0.4-0.3 % SOLN Place 1 drop into both eyes 4 (four) times daily as needed (for dry eyes).   . potassium chloride SA (KLOR-CON M20) 20 MEQ tablet TAKE 2 TABLETS BY MOUTH DAILY (Patient taking differently: Take 40 mEq by mouth daily. )  . pravastatin (PRAVACHOL) 40 MG tablet TAKE 1/2 TABLET BY MOUTH AT BEDTIME FOR CHOLESTEROL (Patient taking differently: Take 20 mg by mouth at bedtime. )  . prednisoLONE acetate (PRED FORTE) 1 % ophthalmic suspension Place 1 drop into both eyes 2 (two) times daily.   . prochlorperazine (COMPAZINE) 5 MG tablet TAKE 1 TABLET BY MOUTH THREE TIMES A DAY AS NEEDED FOR VERTIGO OR NAUSEA  . Rivaroxaban (XARELTO) 15 MG TABS tablet Take 1 tablet  Daily for A fib (Patient taking differently: Take 15 mg by mouth daily with supper. )  . torsemide (DEMADEX) 20 MG tablet Take 2 tablets (40 mg total) by mouth 2 (two) times daily.  Marland Kitchen triamcinolone cream (KENALOG) 0.1 % Apply 1 application topically daily as needed (for dry skin).   Marland Kitchen umeclidinium-vilanterol (ANORO ELLIPTA) 62.5-25 MCG/INH AEPB Inhale 1 puff into the lungs daily.   No current facility-administered medications on file prior to visit.     ROS: all negative except above.   Physical Exam:  BP (!) 142/70   Pulse 83   Temp 97.7 F (36.5 C)   Wt 205 lb (93 kg)   SpO2 96%   BMI 35.19 kg/m   General Appearance: Well nourished, obese, in no acute distress. Eyes: PERRLA, conjunctiva no swelling or erythema Sinuses: No Frontal/maxillary tenderness ENT/Mouth: Ext aud canals clear, TMs without erythema, bulging. No erythema, swelling, or exudate on post pharynx.  Tonsils not swollen or erythematous. Hearing normal.  Neck: Supple Respiratory: Respiratory effort normal, mildly tachypneic, some pursed lip breathing, no retractions or accessory muscle use visible, BS equal bilaterally without rales, rhonchi, wheezing or stridor Cardio: Rate regularly regular with skipped beat every 4th; blowing 3/6 murmur consistent with baseline. Brisk peripheral pulses with 1+ nonpitting symmetrical edema.  Abdomen: Soft, + BS.  Non tender, no guarding, rebound, hernias, masses. Lymphatics: Non tender without lymphadenopathy.  Musculoskeletal: normal gait.  Skin: Warm, dry without rashes, lesions, ecchymosis.  Neuro: Normal muscle tone, no cerebellar symptoms.  Psych: Awake and oriented X 3, normal affect, Insight and Judgment appropriate.     Izora Ribas, NP 12:52 PM [facility]

## 2018-09-23 DIAGNOSIS — J449 Chronic obstructive pulmonary disease, unspecified: Secondary | ICD-10-CM | POA: Diagnosis not present

## 2018-09-27 ENCOUNTER — Telehealth: Payer: Self-pay

## 2018-09-27 NOTE — Telephone Encounter (Signed)
LMOVM to call my direct office number about covid-19 prescreening questions.

## 2018-09-28 ENCOUNTER — Encounter (INDEPENDENT_AMBULATORY_CARE_PROVIDER_SITE_OTHER): Payer: Self-pay

## 2018-09-28 ENCOUNTER — Ambulatory Visit (INDEPENDENT_AMBULATORY_CARE_PROVIDER_SITE_OTHER): Payer: Medicare Other | Admitting: *Deleted

## 2018-09-28 ENCOUNTER — Other Ambulatory Visit: Payer: Self-pay

## 2018-09-28 DIAGNOSIS — I4821 Permanent atrial fibrillation: Secondary | ICD-10-CM | POA: Diagnosis not present

## 2018-09-28 LAB — CUP PACEART INCLINIC DEVICE CHECK
Brady Statistic RA Percent Paced: 0 %
Brady Statistic RV Percent Paced: 92 %
Date Time Interrogation Session: 20200716180830
Implantable Lead Implant Date: 20190829
Implantable Lead Implant Date: 20190829
Implantable Lead Implant Date: 20190829
Implantable Lead Location: 753858
Implantable Lead Location: 753859
Implantable Lead Location: 753860
Implantable Lead Model: 292
Implantable Lead Model: 4674
Implantable Lead Model: 7740
Implantable Lead Serial Number: 446776
Implantable Lead Serial Number: 696999
Implantable Lead Serial Number: 827497
Implantable Pulse Generator Implant Date: 20190829
Lead Channel Pacing Threshold Amplitude: 0.7 V
Lead Channel Pacing Threshold Amplitude: 0.7 V
Lead Channel Pacing Threshold Pulse Width: 0.4 ms
Lead Channel Pacing Threshold Pulse Width: 0.4 ms
Lead Channel Sensing Intrinsic Amplitude: 11.5 mV
Lead Channel Sensing Intrinsic Amplitude: 2.4 mV
Lead Channel Sensing Intrinsic Amplitude: 5.3 mV
Pulse Gen Serial Number: 215092

## 2018-09-28 NOTE — Progress Notes (Signed)
CRT-D device check in office. Thresholds and sensing consistent with previous device measurements. Lead impedance trends stable over time. No mode switch episodes recorded. No ventricular arrhythmia episodes recorded. Patient bi-ventricularly pacing 92% of the time. Device programmed with appropriate safety margins. Heart failure diagnostics reviewed and trends are stable for patient . Estimated longevity 8.5 yrs.  Brady lower rate programmed to 70 bpm due to s/p 2 weeks a/v node ablation. F/U with Dr Lovena Le 10/13/18 in clinic.Patient enrolled in remote follow up. Plan to check device remotely  11/13/18. Patient education completed including shock plan.

## 2018-09-29 ENCOUNTER — Other Ambulatory Visit: Payer: Self-pay | Admitting: Internal Medicine

## 2018-10-02 ENCOUNTER — Telehealth: Payer: Self-pay

## 2018-10-02 ENCOUNTER — Ambulatory Visit: Payer: Self-pay

## 2018-10-02 ENCOUNTER — Ambulatory Visit (INDEPENDENT_AMBULATORY_CARE_PROVIDER_SITE_OTHER): Payer: Medicare Other | Admitting: Orthopaedic Surgery

## 2018-10-02 ENCOUNTER — Other Ambulatory Visit: Payer: Self-pay | Admitting: Internal Medicine

## 2018-10-02 ENCOUNTER — Other Ambulatory Visit: Payer: Self-pay | Admitting: Adult Health

## 2018-10-02 ENCOUNTER — Encounter: Payer: Self-pay | Admitting: Orthopaedic Surgery

## 2018-10-02 DIAGNOSIS — S92411D Displaced fracture of proximal phalanx of right great toe, subsequent encounter for fracture with routine healing: Secondary | ICD-10-CM

## 2018-10-02 DIAGNOSIS — M25572 Pain in left ankle and joints of left foot: Secondary | ICD-10-CM

## 2018-10-02 DIAGNOSIS — J4540 Moderate persistent asthma, uncomplicated: Secondary | ICD-10-CM

## 2018-10-02 MED ORDER — PROMETHAZINE-DM 6.25-15 MG/5ML PO SYRP: 5.0000 mL | ORAL_SOLUTION | Freq: Four times a day (QID) | ORAL | 1 refills | Status: DC | PRN

## 2018-10-02 MED ORDER — LIDOCAINE HCL 1 % IJ SOLN
2.0000 mL | INTRAMUSCULAR | Status: AC | PRN
  Administered 2018-10-02: 09:00:00 2 mL

## 2018-10-02 MED ORDER — METHYLPREDNISOLONE ACETATE 40 MG/ML IJ SUSP
40.0000 mg | INTRAMUSCULAR | Status: AC | PRN
  Administered 2018-10-02: 40 mg via INTRA_ARTICULAR

## 2018-10-02 MED ORDER — BENZONATATE 200 MG PO CAPS: ORAL_CAPSULE | ORAL | 1 refills | Status: DC

## 2018-10-02 MED ORDER — IPRATROPIUM-ALBUTEROL 0.5-2.5 (3) MG/3ML IN SOLN: 3.0000 mL | Freq: Four times a day (QID) | RESPIRATORY_TRACT | Status: DC | PRN

## 2018-10-02 NOTE — Progress Notes (Signed)
Office Visit Note   Patient: Katie [name]           Date of Birth: 15-Feb-1941           MRN: [id number] Visit Date: 10/02/2018              Requested by: [name], [address] [place],  [place] [address] PCP: [name], MD   Assessment & Plan: Visit Diagnoses:  1. Closed displaced fracture of proximal phalanx of right great toe with routine healing, subsequent encounter   2. Pain in left ankle and joints of left foot     Plan: Per her request I did place a steroid injection in her left ankle joint.  We talked in detail about better shoe wear and good comfortable fitting shoes with some form support.  All question concerns were answered and addressed.  Follow-up as otherwise as needed.  Follow-Up Instructions: Return if symptoms worsen or fail to improve.   Orders:  Orders Placed This Encounter  Procedures  . Medium Joint Inj  . XR Toe Great Right   No orders of the defined types were placed in this encounter.     Procedures: Medium Joint Inj: L ankle on 10/02/2018 9:28 AM Medications: 2 mL lidocaine 1 %; 40 mg methylPREDNISolone acetate 40 MG/ML      Clinical Data: No additional findings.   Subjective: Chief Complaint  Patient presents with  . Left Ankle - Pain  . Right Foot - Pain  The patient is continue to be seen for her right foot great toe fracture of the proximal phalanx.  She does still report some pain at the MTP joint.  She also has left ankle pain with known arthritis would like to have a steroid injection left ankle today.  She is getting around with any type of assistive device.  She is active 78 year old.  She does have some neuropathy on occasion but denies any diabetes.  HPI  Review of Systems She currently denies any headache, chest pain, shortness of breath, fever, chills, nausea, vomiting  Objective: Vital Signs: There were no vitals taken for this visit.  Physical Exam She is alert and orient x3 and in  no acute distress Ortho Exam Examination of her right great toe only shows some pain at the MTP joint but not over the fracture site itself.  The toes well-perfused with good range of motion.  Examination of her left ankle shows good range of motion but does stiffness in general and anterior and medial pain. Specialty Comments:  No specialty comments available.  Imaging: Xr Toe Great Right  Result Date: 10/02/2018 3 views of the right great toe compared to previous films show interval healing of an oblique fracture of the shaft of the proximal phalanx.    PMFS History: Patient Active Problem List   Diagnosis Date Noted  . Coronary artery disease due to lipid rich plaque 06/14/2018  . COPD (chronic obstructive pulmonary disease) with chronic bronchitis ([place]) 02/02/2018  . Aortic atherosclerosis ([place]) 10/17/2017  . Depression 09/21/2017  . Bradycardia 09/21/2017  . PAH (pulmonary artery hypertension) ([place]) 06/23/2017  . Esophageal dysphagia 05/15/2017  . Gout 03/20/2017  . Abnormal glucose 09/19/2015  . AAA (abdominal aortic aneurysm) without rupture ([place]) 04/22/2015  . Morbid obesity ([place]) 08/03/2014  . PVD (peripheral vascular disease) with claudication ([place]) 10/16/2013  . CKD (chronic kidney disease) stage 3, GFR 30-59 ml/min ([facility]) 06/08/2013  . Hyperlipidemia, mixed 06/08/2013  . Pulmonary Fibrosis sequellae  of Amiodarone 06/08/2013  . Long term current use of anticoagulant therapy 04/23/2013  . Vitamin D deficiency 02/15/2013  . Hypothyroidism   . Osteopenia   . Chronic combined systolic and diastolic CHF (congestive heart failure) ([place]) 11/25/2008  . Migraine headache 11/22/2008  . Essential hypertension 11/22/2008  . Atherosclerosis of native coronary artery of native heart without angina pectoris 11/22/2008  . Atrial fibrillation ([place]) 11/22/2008  . Chronic obstructive pulmonary disease with hypoxia ([place]) 11/22/2008  . Gastroesophageal reflux disease without esophagitis  11/22/2008   Past Medical History:  Diagnosis Date  . AAA (abdominal aortic aneurysm) ([place])   . AICD (automatic cardioverter/defibrillator) present 11/10/2017  . Arthritis    "some in my knees" (11/10/2017)  . Atrial fibrillation ([place])   . CHF (congestive heart failure) ([place])   . Chronic bronchitis ([place])   . COPD (chronic obstructive pulmonary disease) ([place])    'they say I don't; have a problem breathing though" (11/10/2017)  . Depression   . GERD (gastroesophageal reflux disease)   . Gout    "daily RX" (11/10/2017)  . History of hiatal hernia   . Hyperlipidemia   . Hypertension   . Hypothyroid   . Migraine headache    "hx; none since 1980s" (11/10/2017)  . Osteopenia   . Pneumonia    "~ 3 times" (11/10/2017)  . PVD (peripheral vascular disease) ([facility])     Family History  Problem Relation Age of Onset  . Cirrhosis Mother   . Cancer Mother 92       PANCREAS  . Heart defect Sister   . Breast cancer Sister        age 67  . Heart disease Sister   . Stroke Sister   . Alcohol abuse Father   . Depression Father   . Hypertension Brother   . Hyperlipidemia Son   . Heart disease Daughter     Past Surgical History:  Procedure Laterality Date  . ABDOMINAL AORTIC ENDOVASCULAR STENT GRAFT N/A 04/11/2013   Procedure: ABDOMINAL AORTIC ENDOVASCULAR STENT GRAFT WITH RIGHT FEMORAL PATCH ANGIOPLASTY;  Surgeon: Mal Misty, MD;  Location: [facility];  Service: Vascular;  Laterality: N/A;  . AV NODE ABLATION N/A 09/13/2018   Procedure: AV NODE ABLATION;  Surgeon: Evans Lance, MD;  Location: [facility];  Service: Cardiovascular;  Laterality: N/A;  . BIV ICD INSERTION CRT-D  11/10/2017  . BIV ICD INSERTION CRT-D N/A 11/10/2017   Procedure: BIV ICD INSERTION CRT-D;  Surgeon: Evans Lance, MD;  Location: [facility];  Service: Cardiovascular;  Laterality: N/A;  . BREAST SURGERY     LEFT BREAST BIOPSY  . CARDIAC CATHETERIZATION    . CATARACT EXTRACTION W/ INTRAOCULAR LENS   IMPLANT, BILATERAL Bilateral   . CHOLECYSTECTOMY OPEN  1972  . COLONOSCOPY    . EYE SURGERY Bilateral    "bleeding in my eyes"  . FRACTURE SURGERY    . TIBIA FRACTURE SURGERY Left 1970s  . TONSILLECTOMY    . VARICOSE VEIN SURGERY Bilateral    "laser"   Social History   Occupational History  . Not on file  Tobacco Use  . Smoking status: Former Smoker    Packs/day: 2.00    Years: 54.00    Pack years: 108.00    Types: Cigarettes    Quit date: 08/10/2002    Years since quitting: 16.1  . Smokeless tobacco: Never Used  Substance and Sexual Activity  . Alcohol use: Never    Frequency:  Never  . Drug use: Never  . Sexual activity: Yes    Birth control/protection: Post-menopausal

## 2018-10-02 NOTE — Telephone Encounter (Signed)
States that she no longer has diarrhea, not a concern right now. She had diarrhea on and off for 10 days. Started the diarrhea, and still took the zpak and finished it, had never taken the Levaquin.   Only requesting now tessalon and cough syrup. Please advise.

## 2018-10-02 NOTE — Telephone Encounter (Signed)
Patient states that a new rx was sent in and has not started it yet because the medication causes diarrhea and she is currently having that. Thinks she might need an antibiotic. Please advise.   Also, refill request for Ipratropium solution.

## 2018-10-03 NOTE — Telephone Encounter (Signed)
Left detailed message.   

## 2018-10-12 ENCOUNTER — Telehealth: Payer: Self-pay

## 2018-10-12 NOTE — Telephone Encounter (Signed)
Called to do COVID screening...no answer.

## 2018-10-13 ENCOUNTER — Encounter: Payer: Self-pay | Admitting: Internal Medicine

## 2018-10-13 ENCOUNTER — Other Ambulatory Visit: Payer: Self-pay | Admitting: Internal Medicine

## 2018-10-13 ENCOUNTER — Ambulatory Visit (INDEPENDENT_AMBULATORY_CARE_PROVIDER_SITE_OTHER): Payer: Medicare Other | Admitting: Internal Medicine

## 2018-10-13 ENCOUNTER — Other Ambulatory Visit: Payer: Self-pay

## 2018-10-13 VITALS — BP 116/64 | HR 72 | Ht 64.0 in | Wt 200.8 lb

## 2018-10-13 DIAGNOSIS — I5042 Chronic combined systolic (congestive) and diastolic (congestive) heart failure: Secondary | ICD-10-CM

## 2018-10-13 DIAGNOSIS — I10 Essential (primary) hypertension: Secondary | ICD-10-CM

## 2018-10-13 DIAGNOSIS — I4821 Permanent atrial fibrillation: Secondary | ICD-10-CM

## 2018-10-13 DIAGNOSIS — Z7901 Long term (current) use of anticoagulants: Secondary | ICD-10-CM

## 2018-10-13 DIAGNOSIS — Z95810 Presence of automatic (implantable) cardiac defibrillator: Secondary | ICD-10-CM | POA: Diagnosis not present

## 2018-10-13 DIAGNOSIS — I5022 Chronic systolic (congestive) heart failure: Secondary | ICD-10-CM

## 2018-10-13 LAB — CUP PACEART INCLINIC DEVICE CHECK
Date Time Interrogation Session: 20200731040000
HighPow Impedance: 83 Ohm
Implantable Lead Implant Date: 20190829
Implantable Lead Implant Date: 20190829
Implantable Lead Implant Date: 20190829
Implantable Lead Location: 753858
Implantable Lead Location: 753859
Implantable Lead Location: 753860
Implantable Lead Model: 292
Implantable Lead Model: 4674
Implantable Lead Model: 7740
Implantable Lead Serial Number: 446776
Implantable Lead Serial Number: 696999
Implantable Lead Serial Number: 827497
Implantable Pulse Generator Implant Date: 20190829
Lead Channel Impedance Value: 1091 Ohm
Lead Channel Impedance Value: 428 Ohm
Lead Channel Impedance Value: 614 Ohm
Lead Channel Pacing Threshold Amplitude: 0.7 V
Lead Channel Pacing Threshold Amplitude: 0.8 V
Lead Channel Pacing Threshold Pulse Width: 0.4 ms
Lead Channel Pacing Threshold Pulse Width: 0.4 ms
Lead Channel Sensing Intrinsic Amplitude: 0.8 mV
Lead Channel Setting Pacing Amplitude: 2 V
Lead Channel Setting Pacing Amplitude: 2.5 V
Lead Channel Setting Pacing Pulse Width: 0.4 ms
Lead Channel Setting Pacing Pulse Width: 0.4 ms
Lead Channel Setting Sensing Sensitivity: 0.4 mV
Lead Channel Setting Sensing Sensitivity: 1 mV
Pulse Gen Serial Number: 215092

## 2018-10-13 NOTE — Patient Instructions (Addendum)
Medication Instructions:  Your physician recommends that you continue on your current medications as directed. Please refer to the Current Medication list given to you today.  Labwork: None ordered.  Testing/Procedures: None ordered.  Follow-Up: Your physician wants you to follow-up in: 6 months with Dr. Lovena Le.  You will receive a reminder letter in the mail two months in advance. If you don't receive a letter, please call our office to schedule the follow-up appointment.  Remote monitoring is used to monitor your ICD from home. This monitoring reduces the number of office visits required to check your device to one time per year. It allows [place] to keep an eye on the functioning of your device to ensure it is working properly. You are scheduled for a device check from home on 11/13/2018. You may send your transmission at any time that day. If you have a wireless device, the transmission will be sent automatically. After your physician reviews your transmission, you will receive a postcard with your next transmission date.  Any Other Special Instructions Will Be Listed Below (If Applicable).  If you need a refill on your cardiac medications before your next appointment, please call your pharmacy.

## 2018-10-13 NOTE — Progress Notes (Signed)
HPI Katie [name] returns today for followup of her ICD, s/p AV node ablation. She is a pleasant 78 yo woman with LV dysfunction and VT who underwent Biv ICD insertion and later, AV node ablation. She is improved with regard to her level of activity. She denies chest pain. She has class 2 dyspnea, down from class 3 since her AV node ablation. She denies dietary indiscretion but admits to eating out all of the time.  Allergies  Allergen Reactions  . Amiodarone Other (See Comments)    PULMONARY TOXICITY  . Diovan [Valsartan] Other (See Comments)    HYPOTENSION  . Doxycycline Diarrhea and Other (See Comments)    VISUAL DISTURBANCE  . Flexeril [Cyclobenzaprine] Other (See Comments)    FATIGUE  . Keflex [Cephalexin] Diarrhea  . Verapamil Other (See Comments)    EDEMA  . Codeine Hives     Current Outpatient Medications  Medication Sig Dispense Refill  . acetaminophen (TYLENOL) 500 MG tablet Take 1,000 mg by mouth every 6 (six) hours as needed for moderate pain.     Marland Kitchen albuterol (PROAIR HFA) 108 (90 Base) MCG/ACT inhaler Use 2 Inhalations 15 minutes Apart every 4 hours to Rescue Asthma Attack (Patient taking differently: Inhale 2 puffs into the lungs every 4 (four) hours as needed for wheezing or shortness of breath. ) 42.5 Inhaler 3  . allopurinol (ZYLOPRIM) 300 MG tablet Take 1 tablet (300 mg total) by mouth daily. 90 tablet 1  . aspirin EC 81 MG tablet Take 81 mg by mouth daily.    . benzonatate (TESSALON) 200 MG capsule Take 1 capsule 3 x /day as need for Cough 90 capsule 1  . cetirizine (ZYRTEC) 10 MG tablet Take 10 mg by mouth daily as needed for allergies.     . Cholecalciferol (VITAMIN D3) 5000 units CAPS Take 5,000 Units by mouth daily.     . digoxin (LANOXIN) 0.125 MG tablet Take 1 tablet (0.125 mg total) by mouth daily. 90 tablet 1  . Eyelid Cleansers (AVENOVA) 0.01 % SOLN Apply 1 application topically See admin instructions. Wash eyelids twice daily with cleaner  99  .  gabapentin (NEURONTIN) 600 MG tablet Take 1 tablet 3 x /Day for Pain (Patient taking differently: Take 600 mg by mouth 2 (two) times daily. ) 270 tablet 1  . guaiFENesin (MUCINEX) 600 MG 12 hr tablet Take 600 mg by mouth daily as needed for cough or to loosen phlegm.     Marland Kitchen ipratropium (ATROVENT) 0.06 % nasal spray Use 1 to 2 sprays each Nostril 2 to 3 x / day as needed (Patient taking differently: Place 1-2 sprays into both nostrils 3 (three) times daily as needed for rhinitis. ) 45 mL 3  . ipratropium-albuterol (DUONEB) 0.5-2.5 (3) MG/3ML SOLN Inhale 3 mLs into the lungs every 6 (six) hours as needed (for shortness of breath or wheezing).    Marland Kitchen leflunomide (ARAVA) 10 MG tablet Take 10 mg by mouth daily.     Marland Kitchen levothyroxine (SYNTHROID) 100 MCG tablet TAKE 1 TABLET DAILY ON AN EMPTY STOMACH WITH ONLY WATER FOR 30 MINUTES. (Patient taking differently: Take 100 mcg by mouth daily before breakfast. ) 90 tablet 1  . montelukast (SINGULAIR) 10 MG tablet Take 1 tablet daily for Allergies & Asthma (Patient taking differently: Take 10 mg by mouth at bedtime. ) 90 tablet 1  . pantoprazole (PROTONIX) 40 MG tablet Take 1 tablet (40 mg total) by mouth daily.    [name]  Glycol-Propyl Glycol (SYSTANE) 0.4-0.3 % SOLN Place 1 drop into both eyes 4 (four) times daily as needed (for dry eyes).     . potassium chloride SA (KLOR-CON M20) 20 MEQ tablet TAKE 2 TABLETS BY MOUTH DAILY (Patient taking differently: Take 40 mEq by mouth daily. ) 180 tablet 1  . pravastatin (PRAVACHOL) 40 MG tablet TAKE 1/2 TABLET BY MOUTH AT BEDTIME FOR CHOLESTEROL (Patient taking differently: Take 20 mg by mouth at bedtime. ) 45 tablet 3  . prednisoLONE acetate (PRED FORTE) 1 % ophthalmic suspension Place 1 drop into both eyes 2 (two) times daily.   1  . prochlorperazine (COMPAZINE) 5 MG tablet TAKE 1 TABLET BY MOUTH THREE TIMES A DAY AS NEEDED FOR VERTIGO OR NAUSEA 60 tablet 1  . promethazine-dextromethorphan (PROMETHAZINE-DM) 6.25-15 MG/5ML  syrup Take 5 mLs by mouth 4 (four) times daily as needed for cough. 240 mL 1  . Rivaroxaban (XARELTO) 15 MG TABS tablet Take 1 tablet Daily for A fib (Patient taking differently: Take 15 mg by mouth daily with supper. ) 90 tablet 3  . torsemide (DEMADEX) 20 MG tablet Take 2 tablets (40 mg total) by mouth 2 (two) times daily. 360 tablet 2  . traZODone (DESYREL) 150 MG tablet TAKE 1/2 TO 1 TABLET BY MOUTH 1 HOUR BEFORE SLEEP 90 tablet 0  . triamcinolone cream (KENALOG) 0.1 % Apply 1 application topically daily as needed (for dry skin).     Marland Kitchen umeclidinium-vilanterol (ANORO ELLIPTA) 62.5-25 MCG/INH AEPB Inhale 1 puff into the lungs daily.     No current facility-administered medications for this visit.      Past Medical History:  Diagnosis Date  . AAA (abdominal aortic aneurysm) ([place])   . AICD (automatic cardioverter/defibrillator) present 11/10/2017  . Arthritis    "some in my knees" (11/10/2017)  . Atrial fibrillation ([place])   . CHF (congestive heart failure) ([place])   . Chronic bronchitis ([place])   . COPD (chronic obstructive pulmonary disease) ([place])    'they say I don't; have a problem breathing though" (11/10/2017)  . Depression   . GERD (gastroesophageal reflux disease)   . Gout    "daily RX" (11/10/2017)  . History of hiatal hernia   . Hyperlipidemia   . Hypertension   . Hypothyroid   . Migraine headache    "hx; none since 1980s" (11/10/2017)  . Osteopenia   . Pneumonia    "~ 3 times" (11/10/2017)  . PVD (peripheral vascular disease) ([place])     ROS:   All systems reviewed and negative except as noted in the HPI.   Past Surgical History:  Procedure Laterality Date  . ABDOMINAL AORTIC ENDOVASCULAR STENT GRAFT N/A 04/11/2013   Procedure: ABDOMINAL AORTIC ENDOVASCULAR STENT GRAFT WITH RIGHT FEMORAL PATCH ANGIOPLASTY;  Surgeon: Mal Misty, MD;  Location: [facility];  Service: Vascular;  Laterality: N/A;  . AV NODE ABLATION N/A 09/13/2018   Procedure: AV NODE ABLATION;  Surgeon: Evans Lance, MD;  Location: [facility];  Service: Cardiovascular;  Laterality: N/A;  . BIV ICD INSERTION CRT-D  11/10/2017  . BIV ICD INSERTION CRT-D N/A 11/10/2017   Procedure: BIV ICD INSERTION CRT-D;  Surgeon: Evans Lance, MD;  Location: [facility];  Service: Cardiovascular;  Laterality: N/A;  . BREAST SURGERY     LEFT BREAST BIOPSY  . CARDIAC CATHETERIZATION    . CATARACT EXTRACTION W/ INTRAOCULAR LENS  IMPLANT, BILATERAL Bilateral   . CHOLECYSTECTOMY OPEN  1972  . COLONOSCOPY    .  EYE SURGERY Bilateral    "bleeding in my eyes"  . FRACTURE SURGERY    . TIBIA FRACTURE SURGERY Left 1970s  . TONSILLECTOMY    . VARICOSE VEIN SURGERY Bilateral    "laser"     Family History  Problem Relation Age of Onset  . Cirrhosis Mother   . Cancer Mother 33       PANCREAS  . Heart defect Sister   . Breast cancer Sister        age 38  . Heart disease Sister   . Stroke Sister   . Alcohol abuse Father   . Depression Father   . Hypertension Brother   . Hyperlipidemia Son   . Heart disease Daughter      Social History   Socioeconomic History  . Marital status: Married    Spouse name: Not on file  . Number of children: Not on file  . Years of education: Not on file  . Highest education level: Not on file  Occupational History  . Not on file  Social Needs  . Financial resource strain: Somewhat hard  . Food insecurity    Worry: Never true    Inability: Never true  . Transportation needs    Medical: No    Non-medical: No  Tobacco Use  . Smoking status: Former Smoker    Packs/day: 2.00    Years: 54.00    Pack years: 108.00    Types: Cigarettes    Quit date: 08/10/2002    Years since quitting: 16.1  . Smokeless tobacco: Never Used  Substance and Sexual Activity  . Alcohol use: Never    Frequency: Never  . Drug use: Never  . Sexual activity: Yes    Birth control/protection: Post-menopausal  Lifestyle  . Physical activity    Days per week: 3 days    Minutes  per session: 40 min  . Stress: Not at all  Relationships  . Social connections    Talks on phone: More than three times a week    Gets together: Twice a week    Attends religious service: More than 4 times per year    Active member of club or organization: Yes    Attends meetings of clubs or organizations: Never    Relationship status: Married  . Intimate partner violence    Fear of current or ex partner: No    Emotionally abused: No    Physically abused: No    Forced sexual activity: No  Other Topics Concern  . Not on file  Social History Narrative   Lives in [place] with [place]   One daughter     BP 116/64   Pulse 72   Ht 5\' 4"  (1.626 m)   Wt 200 lb 12.8 oz (91.1 kg)   SpO2 94%   BMI 34.47 kg/m   Physical Exam:  Well appearing NAD HEENT: Unremarkable Neck:  No JVD, no thyromegally Lymphatics:  No adenopathy Back:  No CVA tenderness Lungs:  Clear HEART:  Regular rate rhythm, no murmurs, no rubs, no clicks Abd:  soft, positive bowel sounds, no organomegally, no rebound, no guarding Ext:  2 plus pulses, no edema, no cyanosis, no clubbing Skin:  No rashes no nodules Neuro:  CN II through XII intact, motor grossly intact  EKG - atrial fib with biv pacing  DEVICE  Normal device function.  See PaceArt for details.   Assess/Plan: 1. Chronic systolic heart failure - she appears to have had an improvement in  her LV function with biv pacing. She has class 2 CHF. 2. Atrial fib with a RVR - she is s/p AV node ablation and appears to be improved now that her rate is controlled. Her histograms are a little blunted and I increased her rate response today. 3. ICD - her [employer] device is working normally. We will recheck in several months. 4. HTN - her SBP is controlled today. I encouraged her to avoid restaurant food.  Mikle Bosworth.D.

## 2018-10-15 DIAGNOSIS — J449 Chronic obstructive pulmonary disease, unspecified: Secondary | ICD-10-CM | POA: Diagnosis not present

## 2018-10-23 ENCOUNTER — Other Ambulatory Visit: Payer: Self-pay | Admitting: *Deleted

## 2018-10-23 ENCOUNTER — Other Ambulatory Visit: Payer: Self-pay

## 2018-10-23 NOTE — Patient Outreach (Signed)
Monthly telephone check in. Katie [name] did not answer the phone today but I was able to leave a message and requested a return call.  Katie [name]. Katie Neither, MSN, [facility] Gerontological Nurse Practitioner [facility] Care Management [phone]

## 2018-10-24 DIAGNOSIS — J449 Chronic obstructive pulmonary disease, unspecified: Secondary | ICD-10-CM | POA: Diagnosis not present

## 2018-10-24 NOTE — Patient Outreach (Signed)
Katie [name] returned my call today 10/24/18. She reports she is doing very well. She saw Dr. Carson [name] her cardiologist yesterday. She report they have adjusted her pacemaker rate up to 80.  She is monitoring her blood pressure at home daily. She reports her systolic readings have ranged from 110 - 072 and diastolic 25-75 this week. Dr. Lovena [name] has reviewed and has advised not to worry. She reports she was taken off her blood pressure medication due to bradycardia. She will be following up with her primary care provider, Dr. Melford [name] in September.  She is taking all her regularly scheduled meds and eats a no added salt diet. She denies any edema or SOB.  She will see her eye doctor, Dr. Patrice [name] next week to follow up on her [place] in the eye. She goes every 4 months and when ever she needs to be seen.  Encouraged to continue her current life style habits and medical regimen. Call me if you have any questions. I will call again in September.  [name]. [name], MSN, [facility] Gerontological Nurse Practitioner [facility] Care Management [phone]

## 2018-10-30 ENCOUNTER — Other Ambulatory Visit: Payer: Self-pay | Admitting: Adult Health

## 2018-10-30 ENCOUNTER — Telehealth: Payer: Self-pay

## 2018-10-30 DIAGNOSIS — M1990 Unspecified osteoarthritis, unspecified site: Secondary | ICD-10-CM | POA: Diagnosis not present

## 2018-10-30 DIAGNOSIS — M549 Dorsalgia, unspecified: Secondary | ICD-10-CM | POA: Diagnosis not present

## 2018-10-30 DIAGNOSIS — M25559 Pain in unspecified hip: Secondary | ICD-10-CM | POA: Diagnosis not present

## 2018-10-30 DIAGNOSIS — M79642 Pain in left hand: Secondary | ICD-10-CM | POA: Diagnosis not present

## 2018-10-30 DIAGNOSIS — M069 Rheumatoid arthritis, unspecified: Secondary | ICD-10-CM | POA: Diagnosis not present

## 2018-10-30 MED ORDER — TRAMADOL HCL 50 MG PO TABS: ORAL_TABLET | ORAL | 0 refills | Status: DC

## 2018-10-30 NOTE — Telephone Encounter (Signed)
Feels that her bronchitis had never gotten better. Still takes Bezonatate and Promethazine DM, works fine during the day, its during the night that she has trouble with cough and being able to sleep from this. Requesting something until she is seen next month.

## 2018-10-31 NOTE — Telephone Encounter (Signed)
Left message to call back  

## 2018-11-01 NOTE — Telephone Encounter (Signed)
Patient aware and will pick up samples of inhalers tomorrow.

## 2018-11-04 ENCOUNTER — Other Ambulatory Visit: Payer: Self-pay | Admitting: Internal Medicine

## 2018-11-04 MED ORDER — KETOCONAZOLE 2 % EX CREA: TOPICAL_CREAM | CUTANEOUS | 3 refills | Status: DC

## 2018-11-10 DIAGNOSIS — D472 Monoclonal gammopathy: Secondary | ICD-10-CM | POA: Diagnosis not present

## 2018-11-10 DIAGNOSIS — N179 Acute kidney failure, unspecified: Secondary | ICD-10-CM | POA: Diagnosis not present

## 2018-11-10 DIAGNOSIS — N183 Chronic kidney disease, stage 3 (moderate): Secondary | ICD-10-CM | POA: Diagnosis not present

## 2018-11-10 DIAGNOSIS — I2721 Secondary pulmonary arterial hypertension: Secondary | ICD-10-CM | POA: Diagnosis not present

## 2018-11-10 DIAGNOSIS — I129 Hypertensive chronic kidney disease with stage 1 through stage 4 chronic kidney disease, or unspecified chronic kidney disease: Secondary | ICD-10-CM | POA: Diagnosis not present

## 2018-11-13 ENCOUNTER — Ambulatory Visit (INDEPENDENT_AMBULATORY_CARE_PROVIDER_SITE_OTHER): Payer: Medicare Other | Admitting: *Deleted

## 2018-11-13 DIAGNOSIS — I428 Other cardiomyopathies: Secondary | ICD-10-CM | POA: Diagnosis not present

## 2018-11-14 LAB — CUP PACEART REMOTE DEVICE CHECK
Date Time Interrogation Session: 20200901165250
Implantable Lead Implant Date: 20190829
Implantable Lead Implant Date: 20190829
Implantable Lead Implant Date: 20190829
Implantable Lead Location: 753858
Implantable Lead Location: 753859
Implantable Lead Location: 753860
Implantable Lead Model: 292
Implantable Lead Model: 4674
Implantable Lead Model: 7740
Implantable Lead Serial Number: 446776
Implantable Lead Serial Number: 696999
Implantable Lead Serial Number: 827497
Implantable Pulse Generator Implant Date: 20190829
Pulse Gen Serial Number: 215092

## 2018-11-15 DIAGNOSIS — J449 Chronic obstructive pulmonary disease, unspecified: Secondary | ICD-10-CM | POA: Diagnosis not present

## 2018-11-21 ENCOUNTER — Other Ambulatory Visit: Payer: Self-pay | Admitting: *Deleted

## 2018-11-21 ENCOUNTER — Other Ambulatory Visit: Payer: Self-pay

## 2018-11-21 NOTE — Patient Outreach (Signed)
Telephone call to follow up on pt's plan of care.  She reporst she is doing very well.   [address] [facility] CM Care Plan Problem One     Most Recent Value  Care Plan Problem One  No Advanced Directives  Role Documenting the Problem One  Care Management Coordinator  Care Plan for Problem One  Active  Interventions for Short Term Goal #1  Encouraged completion.    [facility] CM Care Plan Problem Two     Most Recent Value  Care Plan Problem Two  Multiple comorbidities that need monitoring.  Role Documenting the Problem Two  Care Management Coordinator  Care Plan for Problem Two  Active  Interventions for Problem Two Long Term Goal   Continue monitoring. See your doctor at scheduled appt.  [facility] Long Term Goal Start Date  09/18/18     I will call her again in one month to follow up on her MD appts and status.  [name]. [name], MSN, [facility] Gerontological Nurse Practitioner [facility] Care Management [phone]

## 2018-11-22 ENCOUNTER — Encounter: Payer: Self-pay | Admitting: Cardiology

## 2018-11-22 NOTE — Progress Notes (Signed)
FOLLOW UP 3 Month  Assessment and Plan:   Diagnoses and all orders for this visit:  Permanent atrial fibrillation Taking Xeralto20mg  daily  Discussed if patient falls to immediately contact office or go to ER.   Patient understands to call the office before starting a new medication. Follow up in 3 months  Atherosclerosis of native coronary artery of native heart without angina pectoris Control blood pressure, cholesterol, glucose, increase exercise.   Chronic combined systolic and diastolic congestive heart failure (HCC) Weights stable Continue torsemide to 40 mg BID Disease process and medications discussed. Questions answered fully. Emphasized salt restriction, less than 2000mg  a day. Encouraged daily monitoring of the patient's weight, call office if 5 lb weight loss or gain in a day.  Encouraged regular exercise. If any increasing shortness of breath, swelling, or chest pressure go to ER immediately.  decrease your fluid intake to less than 2 L daily please remember to always increase your potassium intake with any increase of your fluid pill.   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 Melvyn Novas, prefers to manage here in office  Hypertension Well controlled with current medications  Follows with Cardiology Monitor blood pressure at home; patient to call if consistently greater than 130/80 Continue DASH diet.   Reminder to go to the ER if any CP, SOB, nausea, dizziness, severe HA, changes vision/speech, left arm numbness and tingling and jaw pain.  Cholesterol Currently above cardiology goal; consider increase of pravastatin to daily if LDL 70+  Continue low cholesterol diet and exercise.  Check lipid panel.   Morbid obesity  ([place]) Long discussion about weight loss, diet, and exercise Recommended diet heavy in fruits and veggies and low in animal meats, cheeses, and dairy products, appropriate calorie intake She will work on slowly increasing exercise, cut down on portions/snacks Discussed appropriate weight for height and initial weight goal (185 lb) Follow up at next visit  Vitamin D Def At goal at last visit; continue supplementation to maintain goal of 60-100 Defer Vit D level  Gastroesophageal reflux disease without esophagitis Doing well on current regiment  Hypothyroidism, unspecified type Taking Levothyroxine 73mcg daily in am on empty stomach continue medications the same pending lab results reminded to take on an empty stomach 30-8mins before food.  check TSH level  Gout Continue allopurinol Diet discussed Check uric acid as needed  Peripheral neuropathy Possibly related to hx of prediabetes, amiodarone hx Gabapentin - poor candidate due to renal functions B12 low normal, check methylmalonic acid per Dr. Melford Aase recommendations After discussion will initiate trial of amitryptylene - 25 mg at HS x 2 weeks, increase to 50 mg x 2 weeks, then increase to 75 mg in 2 weeks if needed; follow up on results Consider neuro referral if no improvement   Continue diet and meds as discussed. Further disposition pending results of labs. Discussed med's effects and SE's.   Over 30 minutes of exam, counseling, chart review, and critical decision making was performed.   Future Appointments  Date Time Provider [place]  12/18/2018 10:30 AM Deloria Lair, NP [facility] None  02/12/2019  8:35 AM [facility] DEVICE REMOTES [facility]  02/27/2019  9:00 AM [name], MD [facility] None  05/14/2019  8:35 AM [facility] DEVICE REMOTES [facility]  07/31/2019  9:00 AM Silverio Lay,  Estill Bamberg, PA-C [facility] None  11/12/2019  8:35 AM [facility] DEVICE REMOTES [facility]  [facility]  02/11/2020  8:35 AM [facility] DEVICE REMOTES [facility]    ----------------------------------------------------------------------------------------------------------------------  HPI 78 y.o. female  presents for 3 month follow up on hypertension, cholesterol, prediabetes, obesity and vitamin D deficiency.    BMI is Body mass index is 34.5 kg/m., she has not been working on diet and exercise related to ankle pain and impaired mobility.  She has a history of AAA s/p repair, a. Fib with chadsvasc of 2 on xarelto, difibrillator implantation in 05/90, Combined Systolic and Diastolic CHF (ECHO 05/3005 EF 35-40%), followed by Dr. Lovena Le. Denies paroxysmal nocturnal dyspnea and edema. Positive for orthopnea. She had AV ablation by Dr. Lovena Le on 09/13/2018 due to tachyarrhythmia.   She is on demadex 40mg  BID; has taken metolazone AS needed in the past but hasn't needed recently.  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 Melvyn Novas but states doesn't want to return. She is currently taking anoro 1 puff daily, reports a significant improvement with this. She reports albuterol use approx 2 x/month. Taking mucinex for thick AM secretions as needed.   She reports overall doing very well for cardiac/pulm; "I feel the best I've felt in a long time."   Most recently she had a + CCP/CRP and is following with rheumatology - Dr. Dossie Der upstairs, reports ? RA, do not see notes have been received, requested today.   She has chronic back pain followed by Dr. Trevor Mace office, reports discussed steroid shots but is deferring for now.   She is concerned about painful peripheral neuropathy ongoing for the past 1-2 years; was improved with gabapentin    She weighs daily at home, reports stable between 194-196 lb.  Wt Readings from Last 3 Encounters:  11/23/18 201 lb (91.2 kg)  10/13/18 200 lb 12.8 oz  (91.1 kg)  09/21/18 205 lb (93 kg)     Her blood pressure has been controlled at home, today their BP is BP: 120/72  She does not workout. She denies chest pain, shortness of breath, dizziness.   She is on cholesterol medication Pravastatin 40 mg every other day and denies myalgias. Her cholesterol is not at goal. The cholesterol last visit was:   Lab Results  Component Value Date   CHOL 169 05/16/2018   HDL 31 (L) 05/16/2018   LDLCALC 96 05/16/2018   TRIG 315 (H) 05/16/2018   CHOLHDL 5.5 (H) 05/16/2018    She has not been working on diet and exercise for glucose management, and denies hyperglycemia. Last A1C in the office was:  Lab Results  Component Value Date   HGBA1C 5.5 01/05/2018   Patient is on Vitamin D supplement.   Lab Results  Component Value Date   VD25OH 66 01/05/2018     She is on thyroid medication. Her medication was not changed last visit.   Lab Results  Component Value Date   TSH 1.80 07/04/2018    She follows  Lab Results  Component Value Date   GFRNONAA 32 (L) 09/05/2018   Lab Results  Component Value Date   CALCIUM 10.1 09/05/2018   CAION 1.21 06/01/2017   PHOS 2.3 (L) 05/08/2017   Lab Results  Component Value Date   MAUQJFHL45 625 05/11/2018     Current Medications:  Current Outpatient Medications on File Prior to Visit  Medication Sig  . acetaminophen (TYLENOL) 500 MG tablet Take 1,000 mg  by mouth every 6 (six) hours as needed for moderate pain.   Marland Kitchen albuterol (PROAIR HFA) 108 (90 Base) MCG/ACT inhaler Use 2 Inhalations 15 minutes Apart every 4 hours to Rescue Asthma Attack (Patient taking differently: Inhale 2 puffs into the lungs every 4 (four) hours as needed for wheezing or shortness of breath. )  . allopurinol (ZYLOPRIM) 300 MG tablet Take 1 tablet (300 mg total) by mouth daily.  Marland Kitchen aspirin EC 81 MG tablet Take 81 mg by mouth daily.  . cetirizine (ZYRTEC) 10 MG tablet Take 10 mg by mouth daily as needed for allergies.   .  Cholecalciferol (VITAMIN D3) 5000 units CAPS Take 5,000 Units by mouth daily.   . digoxin (LANOXIN) 0.125 MG tablet Take 1 tablet (0.125 mg total) by mouth daily.  . Eyelid Cleansers (AVENOVA) 0.01 % SOLN Apply 1 application topically See admin instructions. Wash eyelids twice daily with cleaner  . gabapentin (NEURONTIN) 600 MG tablet Take 1 tablet 3 x /Day for Pain (Patient taking differently: Take 600 mg by mouth 2 (two) times daily. )  . guaiFENesin (MUCINEX) 600 MG 12 hr tablet Take 600 mg by mouth daily as needed for cough or to loosen phlegm.   Marland Kitchen ipratropium (ATROVENT) 0.06 % nasal spray Use 1 to 2 sprays each Nostril 2 to 3 x / day as needed (Patient taking differently: Place 1-2 sprays into both nostrils 3 (three) times daily as needed for rhinitis. )  . ipratropium-albuterol (DUONEB) 0.5-2.5 (3) MG/3ML SOLN Inhale 3 mLs into the lungs every 6 (six) hours as needed (for shortness of breath or wheezing).  Marland Kitchen ketoconazole (NIZORAL) 2 % cream Apply to Skin Fungus / Ringworm 2 x /day  . leflunomide (ARAVA) 10 MG tablet Take 10 mg by mouth daily.   Marland Kitchen levothyroxine (SYNTHROID) 100 MCG tablet TAKE 1 TABLET DAILY ON AN EMPTY STOMACH WITH ONLY WATER FOR 30 MINUTES. (Patient taking differently: Take 100 mcg by mouth daily before breakfast. )  . montelukast (SINGULAIR) 10 MG tablet Take 1 tablet daily for Allergies & Asthma (Patient taking differently: Take 10 mg by mouth at bedtime. )  . pantoprazole (PROTONIX) 40 MG tablet Take 1 tablet (40 mg total) by mouth daily.  [name] Glycol-Propyl Glycol (SYSTANE) 0.4-0.3 % SOLN Place 1 drop into both eyes 4 (four) times daily as needed (for dry eyes).   . potassium chloride SA (KLOR-CON M20) 20 MEQ tablet TAKE 2 TABLETS BY MOUTH DAILY (Patient taking differently: Take 40 mEq by mouth daily. )  . pravastatin (PRAVACHOL) 40 MG tablet TAKE 1/2 TABLET BY MOUTH AT BEDTIME FOR CHOLESTEROL (Patient taking differently: Take 20 mg by mouth at bedtime. )  .  prednisoLONE acetate (PRED FORTE) 1 % ophthalmic suspension Place 1 drop into both eyes 2 (two) times daily.   . prochlorperazine (COMPAZINE) 5 MG tablet TAKE 1 TABLET BY MOUTH THREE TIMES A DAY AS NEEDED FOR VERTIGO OR NAUSEA  . promethazine-dextromethorphan (PROMETHAZINE-DM) 6.25-15 MG/5ML syrup Take 5 mLs by mouth 4 (four) times daily as needed for cough.  . Rivaroxaban (XARELTO) 15 MG TABS tablet Take 1 tablet Daily for A fib (Patient taking differently: Take 15 mg by mouth daily with supper. )  . torsemide (DEMADEX) 20 MG tablet Take 2 tablets (40 mg total) by mouth 2 (two) times daily.  . traMADol (ULTRAM) 50 MG tablet 1/2-1 tablet up to 4 times a day as needed for severe cough  . triamcinolone cream (KENALOG) 0.1 % Apply 1 application topically  daily as needed (for dry skin).   Marland Kitchen umeclidinium-vilanterol (ANORO ELLIPTA) 62.5-25 MCG/INH AEPB Inhale 1 puff into the lungs daily.  . traZODone (DESYREL) 150 MG tablet TAKE 1/2 TO 1 TABLET BY MOUTH 1 HOUR BEFORE SLEEP (Patient not taking: Reported on 11/23/2018)   No current facility-administered medications on file prior to visit.      Allergies:  Allergies  Allergen Reactions  . Amiodarone Other (See Comments)    PULMONARY TOXICITY  . Diovan [Valsartan] Other (See Comments)    HYPOTENSION  . Doxycycline Diarrhea and Other (See Comments)    VISUAL DISTURBANCE  . Flexeril [Cyclobenzaprine] Other (See Comments)    FATIGUE  . Keflex [Cephalexin] Diarrhea  . Verapamil Other (See Comments)    EDEMA  . Codeine Hives     Medical History:  Past Medical History:  Diagnosis Date  . AAA (abdominal aortic aneurysm) ([place])   . AICD (automatic cardioverter/defibrillator) present 11/10/2017  . Arthritis    "some in my knees" (11/10/2017)  . Atrial fibrillation ([place])   . CHF (congestive heart failure) ([place])   . Chronic bronchitis ([place])   . COPD (chronic obstructive pulmonary disease) ([place])    'they say I don't; have a problem breathing though"  (11/10/2017)  . Depression   . GERD (gastroesophageal reflux disease)   . Gout    "daily RX" (11/10/2017)  . History of hiatal hernia   . Hyperlipidemia   . Hypertension   . Hypothyroid   . Migraine headache    "hx; none since 1980s" (11/10/2017)  . Osteopenia   . Pneumonia    "~ 3 times" (11/10/2017)  . PVD (peripheral vascular disease) ([place])    Family history- Reviewed and unchanged Social history- Reviewed and unchanged  Review of Systems:  Review of Systems  Constitutional: Negative for chills, diaphoresis, fever, malaise/fatigue and weight loss.  HENT: Negative for congestion, ear discharge, ear pain, hearing loss, nosebleeds, sinus pain, sore throat and tinnitus.   Eyes: Negative for blurred vision, double vision, pain, discharge and redness.  Respiratory: Negative for cough, hemoptysis, sputum production, shortness of breath and wheezing.   Cardiovascular: Negative for chest pain, palpitations, orthopnea, claudication, leg swelling (improved) and PND.       Mild but resolves over night.  Gastrointestinal: Negative for abdominal pain, blood in stool, constipation, diarrhea, heartburn, melena, nausea and vomiting.  Genitourinary: Negative for dysuria, flank pain, frequency, hematuria and urgency.  Musculoskeletal: Positive for back pain and joint pain. Negative for falls, myalgias and neck pain.       Left ankle pain  Skin: Negative for itching and rash.       Top of left foot  Neurological: Positive for tingling (bil feet) and sensory change (bil feet and ankles neuropathic pain worse). Negative for dizziness, tremors, seizures, loss of consciousness, weakness and headaches.  Endo/Heme/Allergies: Positive for environmental allergies. Negative for polydipsia. Does not bruise/bleed easily.  Psychiatric/Behavioral: Negative for depression, memory loss and suicidal ideas. The patient is not nervous/anxious and does not have insomnia.       Physical Exam: BP 120/72   Pulse 71    Temp (!) 97.3 F (36.3 C)   Ht 5\' 4"  (1.626 m)   Wt 201 lb (91.2 kg)   SpO2 94%   BMI 34.50 kg/m  Wt Readings from Last 3 Encounters:  11/23/18 201 lb (91.2 kg)  10/13/18 200 lb 12.8 oz (91.1 kg)  09/21/18 205 lb (93 kg)   General Appearance: Well nourished, in no apparent  distress. Eyes: PERRLA, EOMs, conjunctiva no swelling or erythema Sinuses: No Frontal/maxillary tenderness ENT/Mouth: Ext aud canals clear, TMs without erythema, bulging. Right TM, serous noted behind TM. No erythema, swelling, or exudate on post pharynx.  Tonsils not swollen or erythematous. Hearing normal.  Neck: Supple, thyroid normal.  Respiratory: Respiratory effort normal, BS equal bilaterally without rales, rhonchi, wheezing or stridor.  Cardio: RRR, 3/6 systolic murmur noted. Thready peripheral pulses, minimal peripheral edema   Abdomen: Soft, + BS.  Non tender, no guarding, rebound, hernias, masses. Lymphatics: Non tender without lymphadenopathy.  Musculoskeletal: Full ROM, 5/5 strength, Antalgic gait. Skin: Warm, dry without rashes, lesions, ecchymosis.  Neuro: Cranial nerves intact. No cerebellar symptoms. Sensation intact to touch bil feet, diminished through feet and ankles bil to monofilament Psych: Awake and oriented X 3, normal affect, Insight and Judgment appropriate.    Izora Ribas, NP 12:01 PM [facility]

## 2018-11-22 NOTE — Progress Notes (Signed)
Remote ICD transmission.   

## 2018-11-23 ENCOUNTER — Telehealth: Payer: Self-pay

## 2018-11-23 ENCOUNTER — Other Ambulatory Visit: Payer: Self-pay

## 2018-11-23 ENCOUNTER — Ambulatory Visit (INDEPENDENT_AMBULATORY_CARE_PROVIDER_SITE_OTHER): Payer: Medicare Other | Admitting: Adult Health

## 2018-11-23 ENCOUNTER — Encounter: Payer: Self-pay | Admitting: Adult Health

## 2018-11-23 VITALS — BP 120/72 | HR 71 | Temp 97.3°F | Ht 64.0 in | Wt 201.0 lb

## 2018-11-23 DIAGNOSIS — E039 Hypothyroidism, unspecified: Secondary | ICD-10-CM | POA: Diagnosis not present

## 2018-11-23 DIAGNOSIS — G588 Other specified mononeuropathies: Secondary | ICD-10-CM | POA: Diagnosis not present

## 2018-11-23 DIAGNOSIS — I2583 Coronary atherosclerosis due to lipid rich plaque: Secondary | ICD-10-CM

## 2018-11-23 DIAGNOSIS — I10 Essential (primary) hypertension: Secondary | ICD-10-CM | POA: Diagnosis not present

## 2018-11-23 DIAGNOSIS — J449 Chronic obstructive pulmonary disease, unspecified: Secondary | ICD-10-CM

## 2018-11-23 DIAGNOSIS — R7309 Other abnormal glucose: Secondary | ICD-10-CM | POA: Diagnosis not present

## 2018-11-23 DIAGNOSIS — K219 Gastro-esophageal reflux disease without esophagitis: Secondary | ICD-10-CM

## 2018-11-23 DIAGNOSIS — F324 Major depressive disorder, single episode, in partial remission: Secondary | ICD-10-CM

## 2018-11-23 DIAGNOSIS — I4821 Permanent atrial fibrillation: Secondary | ICD-10-CM | POA: Diagnosis not present

## 2018-11-23 DIAGNOSIS — R0902 Hypoxemia: Secondary | ICD-10-CM

## 2018-11-23 DIAGNOSIS — Z23 Encounter for immunization: Secondary | ICD-10-CM

## 2018-11-23 DIAGNOSIS — J4540 Moderate persistent asthma, uncomplicated: Secondary | ICD-10-CM

## 2018-11-23 DIAGNOSIS — G589 Mononeuropathy, unspecified: Secondary | ICD-10-CM

## 2018-11-23 DIAGNOSIS — E559 Vitamin D deficiency, unspecified: Secondary | ICD-10-CM

## 2018-11-23 DIAGNOSIS — E538 Deficiency of other specified B group vitamins: Secondary | ICD-10-CM

## 2018-11-23 DIAGNOSIS — M109 Gout, unspecified: Secondary | ICD-10-CM

## 2018-11-23 DIAGNOSIS — E782 Mixed hyperlipidemia: Secondary | ICD-10-CM | POA: Diagnosis not present

## 2018-11-23 DIAGNOSIS — N183 Chronic kidney disease, stage 3 unspecified: Secondary | ICD-10-CM

## 2018-11-23 DIAGNOSIS — I2510 Atherosclerotic heart disease of native coronary artery without angina pectoris: Secondary | ICD-10-CM | POA: Diagnosis not present

## 2018-11-23 DIAGNOSIS — I5042 Chronic combined systolic (congestive) and diastolic (congestive) heart failure: Secondary | ICD-10-CM

## 2018-11-23 MED ORDER — IPRATROPIUM-ALBUTEROL 0.5-2.5 (3) MG/3ML IN SOLN: 3.0000 mL | Freq: Four times a day (QID) | RESPIRATORY_TRACT | 2 refills | Status: DC | PRN

## 2018-11-23 MED ORDER — AMITRIPTYLINE HCL 25 MG PO TABS: ORAL_TABLET | ORAL | 0 refills | Status: DC

## 2018-11-23 MED ORDER — BENZONATATE 200 MG PO CAPS: ORAL_CAPSULE | ORAL | 1 refills | Status: DC

## 2018-11-23 NOTE — Patient Instructions (Addendum)
Goals    . Client verbalize knowledge of Heart Failure diease self management skills by monitoring her weight and blood pressure daily     Check weight daily Monitor BP daily Decrease sugar Emphasized salt restriction, less than 2000mg  a day.  call office if 5 lb weight loss or gain in a day- if gain can take 1/2 of zaroxyln as needed with extra potassium pill.  Encouraged regular exercise. If any increasing shortness of breath, swelling, or chest pressure go to ER immediately.  decrease your fluid intake to less than 2 L daily please remember to always increase your potassium intake with any increase of your fluid pill.      Marland Kitchen DIET - DECREASE SODA OR JUICE INTAKE     Cut back on sweet tea    . Weight (lb) < 185 lb (83.9 kg)        If you decide to try amitriptyline - start 25 mg 1 tab 1 hour prior to bedtime - can increase to 2 tabs in 2 weeks if needed, then up to 3 tabs in another 2 weeks if needed    Neuropathic Pain Neuropathic pain is pain caused by damage to the nerves that are responsible for certain sensations in your body (sensory nerves). The pain can be caused by:  Damage to the sensory nerves that send signals to your spinal cord and brain (peripheral nervous system).  Damage to the sensory nerves in your brain or spinal cord (central nervous system). Neuropathic pain can make you more sensitive to pain. Even a minor sensation can feel very painful. This is usually a long-term condition that can be difficult to treat. The type of pain differs from person to person. It may:  Start suddenly (acute), or it may develop slowly and last for a long time (chronic).  Come and go as damaged nerves heal, or it may stay at the same level for years.  Cause emotional distress, loss of sleep, and a lower quality of life. What are the causes? The most common cause of this condition is diabetes. Many other diseases and conditions can also cause neuropathic pain. Causes of  neuropathic pain can be classified as:  Toxic. This is caused by medicines and chemicals. The most common cause of toxic neuropathic pain is damage from cancer treatments (chemotherapy).  Metabolic. This can be caused by: ? Diabetes. This is the most common disease that damages the nerves. ? Lack of vitamin B from long-term alcohol abuse.  Traumatic. Any injury that cuts, crushes, or stretches a nerve can cause damage and pain. A common example is feeling pain after losing an arm or leg (phantom limb pain).  Compression-related. If a sensory nerve gets trapped or compressed for a long period of time, the blood supply to the nerve can be cut off.  Vascular. Many blood vessel diseases can cause neuropathic pain by decreasing blood supply and oxygen to nerves.  Autoimmune. This type of pain results from diseases in which the body's defense system (immune system) mistakenly attacks sensory nerves. Examples of autoimmune diseases that can cause neuropathic pain include lupus and multiple sclerosis.  Infectious. Many types of viral infections can damage sensory nerves and cause pain. Shingles infection is a common cause of this type of pain.  Inherited. Neuropathic pain can be a symptom of many diseases that are passed down through families (genetic). What increases the risk? You are more likely to develop this condition if:  You have diabetes.  You smoke.  You drink too much alcohol.  You are taking certain medicines, including medicines that kill cancer cells (chemotherapy) or that treat immune system disorders. What are the signs or symptoms? The main symptom is pain. Neuropathic pain is often described as:  Burning.  Shock-like.  Stinging.  Hot or cold.  Itching. How is this diagnosed? No single test can diagnose neuropathic pain. It is diagnosed based on:  Physical exam and your symptoms. Your health care provider will ask you about your pain. You may be asked to use a pain  scale to describe how bad your pain is.  Tests. These may be done to see if you have a high sensitivity to pain and to help find the cause and location of any sensory nerve damage. They include: ? Nerve conduction studies to test how well nerve signals travel through your sensory nerves (electrodiagnostic testing). ? Stimulating your sensory nerves through electrodes on your skin and measuring the response in your spinal cord and brain (somatosensory evoked potential).  Imaging studies, such as: ? X-rays. ? CT scan. ? MRI. How is this treated? Treatment for neuropathic pain may change over time. You may need to try different treatment options or a combination of treatments. Some options include:  Treating the underlying cause of the neuropathy, such as diabetes, kidney disease, or vitamin deficiencies.  Stopping medicines that can cause neuropathy, such as chemotherapy.  Medicine to relieve pain. Medicines may include: ? Prescription or over-the-counter pain medicine. ? Anti-seizure medicine. ? Antidepressant medicines. ? Pain-relieving patches that are applied to painful areas of skin. ? A medicine to numb the area (local anesthetic), which can be injected as a nerve block.  Transcutaneous nerve stimulation. This uses electrical currents to block painful nerve signals. The treatment is painless.  Alternative treatments, such as: ? Acupuncture. ? Meditation. ? Massage. ? Physical therapy. ? Pain management programs. ? Counseling. Follow these instructions at home: Medicines   Take over-the-counter and prescription medicines only as told by your health care provider.  Do not drive or use heavy machinery while taking prescription pain medicine.  If you are taking prescription pain medicine, take actions to prevent or treat constipation. Your health care provider may recommend that you: ? Drink enough fluid to keep your urine pale yellow. ? Eat foods that are high in fiber,  such as fresh fruits and vegetables, whole grains, and beans. ? Limit foods that are high in fat and processed sugars, such as fried or sweet foods. ? Take an over-the-counter or prescription medicine for constipation. Lifestyle   Have a good support system at home.  Consider joining a chronic pain support group.  Do not use any products that contain nicotine or tobacco, such as cigarettes and e-cigarettes. If you need help quitting, ask your health care provider.  Do not drink alcohol. General instructions  Learn as much as you can about your condition.  Work closely with all your health care providers to find the treatment plan that works best for you.  Ask your health care provider what activities are safe for you.  Keep all follow-up visits as told by your health care provider. This is important. Contact a health care provider if:  Your pain treatments are not working.  You are having side effects from your medicines.  You are struggling with tiredness (fatigue), mood changes, depression, or anxiety. Summary  Neuropathic pain is pain caused by damage to the nerves that are responsible for certain sensations in your body (sensory nerves).  Neuropathic pain may come and go as damaged nerves heal, or it may stay at the same level for years.  Neuropathic pain is usually a long-term condition that can be difficult to treat. Consider joining a chronic pain support group. This information is not intended to replace advice given to you by your health care provider. Make sure you discuss any questions you have with your health care provider. Document Released: 11/27/2003 Document Revised: 06/22/2018 Document Reviewed: 03/18/2017 Elsevier Patient Education  [place].     Amitriptyline tablets What is this medicine? AMITRIPTYLINE (a mee TRIP ti leen) is used to treat depression. This medicine may be used for other purposes; ask your health care provider or pharmacist  if you have questions. COMMON BRAND NAME(S): Elavil, Vanatrip What should I tell my health care provider before I take this medicine? They need to know if you have any of these conditions:  an alcohol problem  asthma, difficulty breathing  bipolar disorder or schizophrenia  difficulty passing urine, prostate trouble  glaucoma  heart disease or previous heart attack  liver disease  over active thyroid  seizures  thoughts or plans of suicide, a previous suicide attempt, or family history of suicide attempt  an unusual or allergic reaction to amitriptyline, other medicines, foods, dyes, or preservatives  pregnant or trying to get pregnant  breast-feeding How should I use this medicine? Take this medicine by mouth with a drink of water. Follow the directions on the prescription label. You can take the tablets with or without food. Take your medicine at regular intervals. Do not take it more often than directed. Do not stop taking this medicine suddenly except upon the advice of your doctor. Stopping this medicine too quickly may cause serious side effects or your condition may worsen. A special MedGuide will be given to you by the pharmacist with each prescription and refill. Be sure to read this information carefully each time. Talk to your pediatrician regarding the use of this medicine in children. Special care may be needed. Overdosage: If you think you have taken too much of this medicine contact a poison control center or emergency room at once. NOTE: This medicine is only for you. Do not share this medicine with others. What if I miss a dose? If you miss a dose, take it as soon as you can. If it is almost time for your next dose, take only that dose. Do not take double or extra doses. What may interact with this medicine? Do not take this medicine with any of the following medications:  arsenic trioxide  certain medicines used to regulate abnormal heartbeat or to treat  other heart conditions  cisapride  droperidol  halofantrine  linezolid  MAOIs like Carbex, Eldepryl, Marplan, Nardil, and Parnate  methylene blue  other medicines for mental depression  phenothiazines like perphenazine, thioridazine and chlorpromazine  pimozide  probucol  procarbazine  sparfloxacin  St. John's Wort This medicine may also interact with the following medications:  atropine and related drugs like hyoscyamine, scopolamine, tolterodine and others  barbiturate medicines for inducing sleep or treating seizures, like phenobarbital  cimetidine  disulfiram  ethchlorvynol  thyroid hormones such as levothyroxine  ziprasidone This list may not describe all possible interactions. Give your health care provider a list of all the medicines, herbs, non-prescription drugs, or dietary supplements you use. Also tell them if you smoke, drink alcohol, or use illegal drugs. Some items may interact with your medicine. What should I watch for while  using this medicine? Tell your doctor if your symptoms do not get better or if they get worse. Visit your doctor or health care professional for regular checks on your progress. Because it may take several weeks to see the full effects of this medicine, it is important to continue your treatment as prescribed by your doctor. Patients and their families should watch out for new or worsening thoughts of suicide or depression. Also watch out for sudden changes in feelings such as feeling anxious, agitated, panicky, irritable, hostile, aggressive, impulsive, severely restless, overly excited and hyperactive, or not being able to sleep. If this happens, especially at the beginning of treatment or after a change in dose, call your health care professional. Dennis Bast may get drowsy or dizzy. Do not drive, use machinery, or do anything that needs mental alertness until you know how this medicine affects you. Do not stand or sit up quickly,  especially if you are an older patient. This reduces the risk of dizzy or fainting spells. Alcohol may interfere with the effect of this medicine. Avoid alcoholic drinks. Do not treat yourself for coughs, colds, or allergies without asking your doctor or health care professional for advice. Some ingredients can increase possible side effects. Your mouth may get dry. Chewing sugarless gum or sucking hard candy, and drinking plenty of water will help. Contact your doctor if the problem does not go away or is severe. This medicine may cause dry eyes and blurred vision. If you wear contact lenses you may feel some discomfort. Lubricating drops may help. See your eye doctor if the problem does not go away or is severe. This medicine can cause constipation. Try to have a bowel movement at least every 2 to 3 days. If you do not have a bowel movement for 3 days, call your doctor or health care professional. This medicine can make you more sensitive to the sun. Keep out of the sun. If you cannot avoid being in the sun, wear protective clothing and use sunscreen. Do not use sun lamps or tanning beds/booths. What side effects may I notice from receiving this medicine? Side effects that you should report to your doctor or health care professional as soon as possible:  allergic reactions like skin rash, itching or hives, swelling of the face, lips, or tongue  anxious  breathing problems  changes in vision  confusion  elevated mood, decreased need for sleep, racing thoughts, impulsive behavior  eye pain  fast, irregular heartbeat  feeling faint or lightheaded, falls  feeling agitated, angry, or irritable  fever with increased sweating  hallucination, loss of contact with reality  seizures  stiff muscles  suicidal thoughts or other mood changes  tingling, pain, or numbness in the feet or hands  trouble passing urine or change in the amount of urine  trouble sleeping  unusually weak or  tired  vomiting  yellowing of the eyes or skin Side effects that usually do not require medical attention (report to your doctor or health care professional if they continue or are bothersome):  change in sex drive or performance  change in appetite or weight  constipation  dizziness  dry mouth  nausea  tired  tremors  upset stomach This list may not describe all possible side effects. Call your doctor for medical advice about side effects. You may report side effects to FDA at 1-800-FDA-1088. Where should I keep my medicine? Keep out of the reach of children. Store at room temperature between 20 and 25  degrees C (68 and 77 degrees F). Throw away any unused medicine after the expiration date. NOTE: This sheet is a summary. It may not cover all possible information. If you have questions about this medicine, talk to your doctor, pharmacist, or health care provider.  2020 Elsevier/Gold Standard (2018-02-21 13:04:32)

## 2018-11-23 NOTE — Telephone Encounter (Signed)
Not comfortable taking the new medication; amitriptyline that was prescribed today at her visit. Requesting an alternative and would like to wait to see what her blood work shows.

## 2018-11-24 DIAGNOSIS — J449 Chronic obstructive pulmonary disease, unspecified: Secondary | ICD-10-CM | POA: Diagnosis not present

## 2018-11-25 ENCOUNTER — Other Ambulatory Visit: Payer: Self-pay | Admitting: Internal Medicine

## 2018-11-26 LAB — CBC WITH DIFFERENTIAL/PLATELET
Absolute Monocytes: 828 cells/uL (ref 200–950)
Basophils Absolute: 73 cells/uL (ref 0–200)
Basophils Relative: 0.8 %
Eosinophils Absolute: 300 cells/uL (ref 15–500)
Eosinophils Relative: 3.3 %
HCT: 50.1 % — ABNORMAL HIGH (ref 35.0–45.0)
Hemoglobin: 16.5 g/dL — ABNORMAL HIGH (ref 11.7–15.5)
Lymphs Abs: 1711 cells/uL (ref 850–3900)
MCH: 28.8 pg (ref 27.0–33.0)
MCHC: 32.9 g/dL (ref 32.0–36.0)
MCV: 87.6 fL (ref 80.0–100.0)
MPV: 11.8 fL (ref 7.5–12.5)
Monocytes Relative: 9.1 %
Neutro Abs: 6188 cells/uL (ref 1500–7800)
Neutrophils Relative %: 68 %
Platelets: 234 10*3/uL (ref 140–400)
RBC: 5.72 10*6/uL — ABNORMAL HIGH (ref 3.80–5.10)
RDW: 14.6 % (ref 11.0–15.0)
Total Lymphocyte: 18.8 %
WBC: 9.1 10*3/uL (ref 3.8–10.8)

## 2018-11-26 LAB — COMPLETE METABOLIC PANEL WITH GFR
AG Ratio: 2 (calc) (ref 1.0–2.5)
ALT: 11 U/L (ref 6–29)
AST: 17 U/L (ref 10–35)
Albumin: 4.2 g/dL (ref 3.6–5.1)
Alkaline phosphatase (APISO): 39 U/L (ref 37–153)
BUN/Creatinine Ratio: 15 (calc) (ref 6–22)
BUN: 21 mg/dL (ref 7–25)
CO2: 25 mmol/L (ref 20–32)
Calcium: 10.5 mg/dL — ABNORMAL HIGH (ref 8.6–10.4)
Chloride: 100 mmol/L (ref 98–110)
Creat: 1.42 mg/dL — ABNORMAL HIGH (ref 0.60–0.93)
GFR, Est African American: 41 mL/min/{1.73_m2} — ABNORMAL LOW (ref >=60)
GFR, Est Non African American: 36 mL/min/{1.73_m2} — ABNORMAL LOW (ref >=60)
Globulin: 2.1 g/dL (calc) (ref 1.9–3.7)
Glucose, Bld: 95 mg/dL (ref 65–99)
Potassium: 4.5 mmol/L (ref 3.5–5.3)
Sodium: 140 mmol/L (ref 135–146)
Total Bilirubin: 0.6 mg/dL (ref 0.2–1.2)
Total Protein: 6.3 g/dL (ref 6.1–8.1)

## 2018-11-26 LAB — LIPID PANEL
Cholesterol: 164 mg/dL (ref <200)
HDL: 33 mg/dL — ABNORMAL LOW (ref >=50)
LDL Cholesterol (Calc): 94 mg/dL (calc)
Non-HDL Cholesterol (Calc): 131 mg/dL (calc) — ABNORMAL HIGH (ref <130)
Total CHOL/HDL Ratio: 5 (calc) — ABNORMAL HIGH (ref <5.0)
Triglycerides: 243 mg/dL — ABNORMAL HIGH (ref <150)

## 2018-11-26 LAB — METHYLMALONIC ACID, SERUM: Methylmalonic Acid, Quant: 293 nmol/L (ref 87–318)

## 2018-11-26 LAB — MAGNESIUM: Magnesium: 2.1 mg/dL (ref 1.5–2.5)

## 2018-11-26 LAB — TSH: TSH: 2.29 mIU/L (ref 0.40–4.50)

## 2018-11-27 NOTE — Telephone Encounter (Signed)
States that she will try the amitriptyline after all.

## 2018-11-28 ENCOUNTER — Telehealth: Payer: Self-pay | Admitting: *Deleted

## 2018-11-28 NOTE — Telephone Encounter (Signed)
Patient called and states she is taking her Allopurinol daily, but thinks she has gout in her left big toe. Per Dr Melford Aase, patient will need an OV to evaluate and have lab work done. Appointment scheduled.

## 2018-11-29 ENCOUNTER — Encounter: Payer: Self-pay | Admitting: Internal Medicine

## 2018-11-29 NOTE — Progress Notes (Signed)
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  She is taking Allopurinol 300 mg Daily & last Uric Acid in April was 5.3% - Normal.       She's also c/o diffuse hair loss  & thinning.  Medications  Current Outpatient Medications (Endocrine & Metabolic):  .  levothyroxine (SYNTHROID) 100 MCG tablet, TAKE 1 TABLET DAILY ON AN EMPTY STOMACH WITH ONLY WATER FOR 30 MINUTES. (Patient taking differently: Take 100 mcg by mouth daily before breakfast. )  Current Outpatient Medications (Cardiovascular):  .  digoxin (LANOXIN) 0.125 MG tablet, Take 1 tablet (0.125 mg total) by mouth daily. .  pravastatin (PRAVACHOL) 40 MG tablet, TAKE 1/2 TABLET BY MOUTH AT BEDTIME FOR CHOLESTEROL (Patient taking differently: Take 20 mg by mouth at bedtime. ) .  torsemide (DEMADEX) 20 MG tablet, Take 2 tablets (40 mg total) by mouth 2 (two) times daily.  Current Outpatient Medications (Respiratory):  .  albuterol (PROAIR HFA) 108 (90 Base) MCG/ACT inhaler, Use 2 Inhalations 15 minutes Apart every 4 hours to Rescue Asthma Attack (Patient taking differently: Inhale 2 puffs into the lungs every 4 (four) hours as needed for wheezing or shortness of breath. ) .  benzonatate (TESSALON) 200 MG capsule, Take 1 capsule 3 x /day as need for Cough .  cetirizine (ZYRTEC) 10 MG tablet, Take 10 mg by mouth daily as needed for allergies.  Marland Kitchen  guaiFENesin (MUCINEX) 600 MG 12 hr tablet, Take 600 mg by mouth daily as needed for cough or to loosen phlegm.  Marland Kitchen  ipratropium (ATROVENT) 0.06 % nasal spray, Use 1 to 2 sprays each Nostril 2 to 3 x / day as needed (Patient taking differently: Place 1-2 sprays into both nostrils 3 (three) times daily as needed for rhinitis. ) .  ipratropium-albuterol  (DUONEB) 0.5-2.5 (3) MG/3ML SOLN, Inhale 3 mLs into the lungs every 6 (six) hours as needed (for shortness of breath or wheezing). .  montelukast (SINGULAIR) 10 MG tablet, Take 1 tablet daily for Allergies & Asthma (Patient taking differently: Take 10 mg by mouth at bedtime. ) .  umeclidinium-vilanterol (ANORO ELLIPTA) 62.5-25 MCG/INH AEPB, Inhale 1 puff into the lungs daily.  Current Outpatient Medications (Analgesics):  .  acetaminophen (TYLENOL) 500 MG tablet, Take 1,000 mg by mouth every 6 (six) hours as needed for moderate pain.  Marland Kitchen  allopurinol (ZYLOPRIM) 300 MG tablet, Take 1 tablet (300 mg total) by mouth daily. Marland Kitchen  aspirin EC 81 MG tablet, Take 81 mg by mouth daily. Marland Kitchen  leflunomide (ARAVA) 10 MG tablet, Take 10 mg by mouth daily.   Current Outpatient Medications (Hematological):  Marland Kitchen  Rivaroxaban (XARELTO) 15 MG TABS tablet, Take 1 tablet Daily for A fib (Patient taking differently: Take 15 mg by mouth daily with supper. )  Current Outpatient Medications (Other):  .  amitriptyline (ELAVIL) 25 MG tablet, Take 1 tab by mouth 1 hour prior to bedtime x 2 weeks; increase to 2 tabs in 2 weeks if needed. .  Cholecalciferol (VITAMIN D3) 5000 units CAPS, Take 5,000 Units by mouth daily.  .  Eyelid Cleansers (AVENOVA) 0.01 % SOLN, Apply 1 application topically See admin instructions. Wash eyelids twice daily with cleaner .  gabapentin (NEURONTIN) 600  MG tablet, Take 1 tablet 3 x /Day for Pain (Patient taking differently: Take 600 mg by mouth 2 (two) times daily. ) .  ketoconazole (NIZORAL) 2 % cream, Apply to Skin Fungus / Ringworm 2 x /day .  pantoprazole (PROTONIX) 40 MG tablet, Take 1 tablet (40 mg total) by mouth daily. [name] Glycol-Propyl Glycol (SYSTANE) 0.4-0.3 % SOLN, Place 1 drop into both eyes 4 (four) times daily as needed (for dry eyes).  .  potassium chloride SA (KLOR-CON M20) 20 MEQ tablet, Take 2 tablets Daily for Potassium .  prednisoLONE acetate (PRED FORTE) 1 % ophthalmic  suspension, Place 1 drop into both eyes 2 (two) times daily.  .  prochlorperazine (COMPAZINE) 5 MG tablet, TAKE 1 TABLET BY MOUTH THREE TIMES A DAY AS NEEDED FOR VERTIGO OR NAUSEA .  triamcinolone cream (KENALOG) 0.1 %, Apply 1 application topically daily as needed (for dry skin).   Problem list She has Migraine headache; Essential hypertension; Atherosclerosis of native coronary artery of native heart without angina pectoris; Atrial fibrillation ([place]); Chronic combined systolic and diastolic CHF (congestive heart failure) ([place]); Chronic obstructive pulmonary disease with hypoxia ([place]); Gastroesophageal reflux disease without esophagitis; Osteopenia; Hypothyroidism; Vitamin D deficiency; Long term current use of anticoagulant therapy; CKD (chronic kidney disease) stage 3, GFR 30-59 ml/min (H[facility]); Hyperlipidemia, mixed; Pulmonary Fibrosis sequellae of Amiodarone; PVD (peripheral vascular disease) with claudication ([place]); Morbid obesity ([place]); AAA (abdominal aortic aneurysm) without rupture ([place]); Abnormal glucose; Gout; Esophageal dysphagia; PAH (pulmonary artery hypertension) ([place]); Depression; Bradycardia; Aortic atherosclerosis (HCC); COPD (chronic obstructive pulmonary disease) with chronic bronchitis ([place]); Coronary artery disease due to lipid rich plaque; 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; and Mononeuropathy on their problem list.   Observations/Objective:  There were no vitals taken for this visit.  HEENT - WNL. Neck - supple.  Chest - Clear equal BS. Cor - Nl HS. RRR w/o sig MGR. PP 1(+).  Decreased capillary refill & sl coolness of toes. No edema. MS- FROM w/o deformities.  Gait Nl. Tenderness  At bilat MTP jts and over distal feet bilaterally.  Neuro -  Nl w/o focal abnormalities.  Assessment and Plan:  1. Gout, suspect vs pseudogout  - Uric acid - CBC with Differential/Platelet - Sedimentation rate  - predniSONE (DELTASONE) 20 MG tablet; 1 tab 3 x  day for 2 days, then  2 x day for 2 days, then  1 x day for 3 days  Dispense: 13 tablet; Refill: 0  2. Zinc deficiency  - Zinc  3. Iron deficiency  - Iron and IBC ([id number]) - Ferritin  4. Hair loss  - Zinc - Iron and IBC ([id number]) - Ferritin   I discussed the assessment and treatment plan with the patient. The patient was provided an opportunity to ask questions and all were answered. The patient agreed with the plan and demonstrated an understanding of the instructions.  The patient was advised to call back or seek an in-person evaluation if the symptoms worsen or if the condition fails to improve as anticipated. Between 15-18 minutes of counseling, chart review, and critical decision making was performed   Kirtland Bouchard, MD

## 2018-11-29 NOTE — Patient Instructions (Signed)
Gout  Gout is a condition that causes painful swelling of the joints. Gout is a type of inflammation of the joints (arthritis). This condition is caused by having too much uric acid in the body. Uric acid is a chemical that forms when the body breaks down substances called purines. Purines are important for building body proteins. When the body has too much uric acid, sharp crystals can form and build up inside the joints. This causes pain and swelling. Gout attacks can happen quickly and may be very painful (acute gout). Over time, the attacks can affect more joints and become more frequent (chronic gout). Gout can also cause uric acid to build up under the skin and inside the kidneys. What are the causes? This condition is caused by too much uric acid in your blood. This can happen because:  Your kidneys do not remove enough uric acid from your blood. This is the most common cause.  Your body makes too much uric acid. This can happen with some cancers and cancer treatments. It can also occur if your body is breaking down too many red blood cells (hemolytic anemia).  You eat too many foods that are high in purines. These foods include organ meats and some seafood. Alcohol, especially beer, is also high in purines. A gout attack may be triggered by trauma or stress. What increases the risk? You are more likely to develop this condition if you:  Have a family history of gout.  Are female and middle-aged.  Are female and have gone through menopause.  Are obese.  Frequently drink alcohol, especially beer.  Are dehydrated.  Lose weight too quickly.  Have an organ transplant.  Have lead poisoning.  Take certain medicines, including aspirin, cyclosporine, diuretics, levodopa, and niacin.  Have kidney disease.  Have a skin condition called psoriasis. What are the signs or symptoms? An attack of acute gout happens quickly. It usually occurs in just one joint. The most common place is  the big toe. Attacks often start at night. Other joints that may be affected include joints of the feet, ankle, knee, fingers, wrist, or elbow. Symptoms of this condition may include:  Severe pain.  Warmth.  Swelling.  Stiffness.  Tenderness. The affected joint may be very painful to touch.  Shiny, red, or purple skin.  Chills and fever. Chronic gout may cause symptoms more frequently. More joints may be involved. You may also have white or yellow lumps (tophi) on your hands or feet or in other areas near your joints. How is this diagnosed? This condition is diagnosed based on your symptoms, medical history, and physical exam. You may have tests, such as:  Blood tests to measure uric acid levels.  Removal of joint fluid with a thin needle (aspiration) to look for uric acid crystals.  X-rays to look for joint damage. How is this treated? Treatment for this condition has two phases: treating an acute attack and preventing future attacks. Acute gout treatment may include medicines to reduce pain and swelling, including:  NSAIDs.  Steroids. These are strong anti-inflammatory medicines that can be taken by mouth (orally) or injected into a joint.  Colchicine. This medicine relieves pain and swelling when it is taken soon after an attack. It can be given by mouth or through an IV. Preventive treatment may include:  Daily use of smaller doses of NSAIDs or colchicine.  Use of a medicine that reduces uric acid levels in your blood.  Changes to your diet. You may   need to see a dietitian about what to eat and drink to prevent gout. Follow these instructions at home: During a gout attack   If directed, put ice on the affected area: ? Put ice in a plastic bag. ? Place a towel between your skin and the bag. ? Leave the ice on for 20 minutes, 2-3 times a day.  Raise (elevate) the affected joint above the level of your heart as often as possible.  Rest the joint as much as possible.  If the affected joint is in your leg, you may be given crutches to use.  Follow instructions from your health care provider about eating or drinking restrictions. Avoiding future gout attacks  Follow a low-purine diet as told by your dietitian or health care provider. Avoid foods and drinks that are high in purines, including liver, kidney, anchovies, asparagus, herring, mushrooms, mussels, and beer.  Maintain a healthy weight or lose weight if you are overweight. If you want to lose weight, talk with your health care provider. It is important that you do not lose weight too quickly.  Start or maintain an exercise program as told by your health care provider. Eating and drinking  Drink enough fluids to keep your urine pale yellow.  If you drink alcohol: ? Limit how much you use to:  0-1 drink a day for women.  0-2 drinks a day for men. ? Be aware of how much alcohol is in your drink. In the [place] one drink equals one 12 oz bottle of beer (355 mL) one 5 oz glass of wine (148 mL), or one 1 oz glass of hard liquor (44 mL). General instructions  Take over-the-counter and prescription medicines only as told by your health care provider.  Do not drive or use heavy machinery while taking prescription pain medicine.  Return to your normal activities as told by your health care provider. Ask your health care provider what activities are safe for you.  Keep all follow-up visits as told by your health care provider. This is important. Contact a health care provider if you have:  Another gout attack.  Continuing symptoms of a gout attack after 10 days of treatment.  Side effects from your medicines.  Chills or a fever.  Burning pain when you urinate.  Pain in your lower back or belly. Get help right away if you:  Have severe or uncontrolled pain.  Cannot urinate. Summary  Gout is painful swelling of the joints caused by inflammation.  The most common site of pain is the big  toe, but it can affect other joints in the body.  Medicines and dietary changes can help to prevent and treat gout attacks. +++++++++++++++++++++++++++++++++++    Low-Purine Eating Plan A low-purine eating plan involves making food choices to limit your intake of purine. Purine is a kind of uric acid. Too much uric acid in your blood can cause certain conditions, such as gout and kidney stones. Eating a low-purine diet can help control these conditions. What are tips for following this plan? Reading food labels   Avoid foods with saturated or Trans fat.  Check the ingredient list of grains-based foods, such as bread and cereal, to make sure that they contain whole grains.  Check the ingredient list of sauces or soups to make sure they do not contain meat or fish.  When choosing soft drinks, check the ingredient list to make sure they do not contain high-fructose corn syrup. Shopping  Buy plenty of fresh fruits  and vegetables.  Avoid buying canned or fresh fish.  Buy dairy products labeled as low-fat or nonfat.  Avoid buying premade or processed foods. These foods are often high in fat, salt (sodium), and added sugar. Cooking  Use olive oil instead of butter when cooking. Oils like olive oil, canola oil, and sunflower oil contain healthy fats. Meal planning  Learn which foods do or do not affect you. If you find out that a food tends to cause your gout symptoms to flare up, avoid eating that food. You can enjoy foods that do not cause problems. If you have any questions about a food item, talk with your dietitian or health care provider.  Limit foods high in fat, especially saturated fat. Fat makes it harder for your body to get rid of uric acid.  Choose foods that are lower in fat and are lean sources of protein. General guidelines  Limit alcohol intake to no more than 1 drink a day for nonpregnant women and 2 drinks a day for men. One drink equals 12 oz of beer, 5 oz of  wine, or 1 oz of hard liquor. Alcohol can affect the way your body gets rid of uric acid.  Drink plenty of water to keep your urine clear or pale yellow. Fluids can help remove uric acid from your body.  If directed by your health care provider, take a vitamin C supplement.  Work with your health care provider and dietitian to develop a plan to achieve or maintain a healthy weight. Losing weight can help reduce uric acid in your blood. What foods are recommended? The items listed may not be a complete list. Talk with your dietitian about what dietary choices are best for you. Foods low in purines Foods low in purines do not need to be limited. These include:  All fruits.  All low-purine vegetables, pickles, and olives.  Breads, pasta, rice, cornbread, and popcorn. Cake and other baked goods.  All dairy foods.  Eggs, nuts, and nut butters.  Spices and condiments, such as salt, herbs, and vinegar.  Plant oils, butter, and margarine.  Water, sugar-free soft drinks, tea, coffee, and cocoa.  Vegetable-based soups, broths, sauces, and gravies. Foods moderate in purines Foods moderate in purines should be limited to the amounts listed.   cup of asparagus, cauliflower, spinach, mushrooms, or green peas, each day.  2/3 cup uncooked oatmeal, each day.   cup dry wheat bran or wheat germ, each day.  2-3 ounces of meat or poultry, each day.  4-6 ounces of shellfish, such as crab, lobster, oysters, or shrimp, each day.  1 cup cooked beans, peas, or lentils, each day.  Soup, broths, or bouillon made from meat or fish. Limit these foods as much as possible. What foods are not recommended? The items listed may not be a complete list. Talk with your dietitian about what dietary choices are best for you. Limit your intake of foods high in purines, including:  Beer and other alcohol.  Meat-based gravy or sauce.  Canned or fresh fish, such as: ? Anchovies, sardines, herring, and  tuna. ? Mussels and scallops. ? Codfish, trout, and haddock.  Berniece Salines.  Organ meats, such as: ? Liver or kidney. ? Tripe. ? Sweetbreads (thymus gland or pancreas).  Wild [employer] biochemist.  Yeast or yeast extract supplements.  Drinks sweetened with high-fructose corn syrup. Summary  Eating a low-purine diet can help control conditions caused by too much uric acid in the body, such as gout  or kidney stones.  Choose low-purine foods, limit alcohol, and limit foods high in fat.  You will learn over time which foods do or do not affect you. If you find out that a food tends to cause your gout symptoms to flare up, avoid eating that food. This information is not intended to replace advice given to you by your health care provider. Make sure you discuss any questions you have with your health care provider. Document Released: 06/26/2010 Document Revised: 02/11/2017 Document Reviewed: 04/14/2016 Elsevier Patient Education  2020 [employer].

## 2018-11-30 ENCOUNTER — Other Ambulatory Visit: Payer: Self-pay

## 2018-11-30 ENCOUNTER — Ambulatory Visit (INDEPENDENT_AMBULATORY_CARE_PROVIDER_SITE_OTHER): Payer: Medicare Other | Admitting: Internal Medicine

## 2018-11-30 VITALS — BP 124/68 | HR 72 | Temp 97.7°F | Resp 16 | Ht 64.0 in | Wt 201.8 lb

## 2018-11-30 DIAGNOSIS — E611 Iron deficiency: Secondary | ICD-10-CM

## 2018-11-30 DIAGNOSIS — E60 Dietary zinc deficiency: Secondary | ICD-10-CM

## 2018-11-30 DIAGNOSIS — M109 Gout, unspecified: Secondary | ICD-10-CM

## 2018-11-30 DIAGNOSIS — L659 Nonscarring hair loss, unspecified: Secondary | ICD-10-CM | POA: Diagnosis not present

## 2018-11-30 MED ORDER — PREDNISONE 20 MG PO TABS: ORAL_TABLET | ORAL | 0 refills | Status: DC

## 2018-12-03 LAB — CBC WITH DIFFERENTIAL/PLATELET
Absolute Monocytes: 860 cells/uL (ref 200–950)
Basophils Absolute: 60 cells/uL (ref 0–200)
Basophils Relative: 0.7 %
Eosinophils Absolute: 378 cells/uL (ref 15–500)
Eosinophils Relative: 4.4 %
HCT: 45.1 % — ABNORMAL HIGH (ref 35.0–45.0)
Hemoglobin: 15 g/dL (ref 11.7–15.5)
Lymphs Abs: 1969 cells/uL (ref 850–3900)
MCH: 29.1 pg (ref 27.0–33.0)
MCHC: 33.3 g/dL (ref 32.0–36.0)
MCV: 87.4 fL (ref 80.0–100.0)
MPV: 11.7 fL (ref 7.5–12.5)
Monocytes Relative: 10 %
Neutro Abs: 5332 cells/uL (ref 1500–7800)
Neutrophils Relative %: 62 %
Platelets: 223 10*3/uL (ref 140–400)
RBC: 5.16 10*6/uL — ABNORMAL HIGH (ref 3.80–5.10)
RDW: 14.2 % (ref 11.0–15.0)
Total Lymphocyte: 22.9 %
WBC: 8.6 10*3/uL (ref 3.8–10.8)

## 2018-12-03 LAB — URIC ACID: Uric Acid, Serum: 5.6 mg/dL (ref 2.5–7.0)

## 2018-12-03 LAB — SEDIMENTATION RATE: Sed Rate: 6 mm/h (ref 0–30)

## 2018-12-03 LAB — ZINC: Zinc: 60 ug/dL (ref 60–130)

## 2018-12-03 LAB — FERRITIN: Ferritin: 70 ng/mL (ref 16–288)

## 2018-12-05 NOTE — Progress Notes (Signed)
Patient is aware of lab results. States that she is still having pain in her feet and her hair is still falling out. Katie [name].

## 2018-12-07 ENCOUNTER — Telehealth: Payer: Self-pay | Admitting: *Deleted

## 2018-12-07 NOTE — Telephone Encounter (Signed)
Patient called and reported her feet, even though her uric acid was normal. She states her hair is still falling out.  Per Dr Melford Aase, continue Tylenol for the pain and add Zinc 50 mg and Iron 65 mg to help her hair.  Patient is aware.

## 2018-12-15 DIAGNOSIS — J449 Chronic obstructive pulmonary disease, unspecified: Secondary | ICD-10-CM | POA: Diagnosis not present

## 2018-12-18 ENCOUNTER — Other Ambulatory Visit: Payer: Self-pay | Admitting: *Deleted

## 2018-12-18 ENCOUNTER — Other Ambulatory Visit: Payer: Self-pay

## 2018-12-18 DIAGNOSIS — H0102A Squamous blepharitis right eye, upper and lower eyelids: Secondary | ICD-10-CM | POA: Diagnosis not present

## 2018-12-18 DIAGNOSIS — H0102B Squamous blepharitis left eye, upper and lower eyelids: Secondary | ICD-10-CM | POA: Diagnosis not present

## 2018-12-18 DIAGNOSIS — Z961 Presence of intraocular lens: Secondary | ICD-10-CM | POA: Diagnosis not present

## 2018-12-18 DIAGNOSIS — L719 Rosacea, unspecified: Secondary | ICD-10-CM | POA: Diagnosis not present

## 2018-12-18 DIAGNOSIS — H1132 Conjunctival hemorrhage, left eye: Secondary | ICD-10-CM | POA: Diagnosis not present

## 2018-12-18 NOTE — Patient Outreach (Signed)
Chronic disease care management call. Unable to talk with Katie [name] today, but left a message and requested a return call.  [name]. [name], MSN, [facility] Gerontological Nurse Practitioner [facility] Care Management [phone]

## 2018-12-21 ENCOUNTER — Other Ambulatory Visit: Payer: Self-pay | Admitting: *Deleted

## 2018-12-22 ENCOUNTER — Other Ambulatory Visit: Payer: Self-pay | Admitting: *Deleted

## 2018-12-22 NOTE — Patient Outreach (Signed)
Monthly telephone assessment for chronic disease surveillance.  Katie [name] reports she has no changes in her health status.   AFIB: Controlled with pacermaker and digoxin. On Xarelto and ASA 81 mg.  HTN: She continues to monitor her BPs which are in th 355-732'K systolically. This has been reviewed by Dr. Melford Aase and the benefits of these pressure readings outweigh the risks she was having being on antihypertensives which were discontinued.  CHF: Her weight ranges from 194-196. She denies SOB. She does have minimal edema occasionally. She follows a low sodium diet.  COPD: No respiratory issues, takes her Anora Ellipta inhaler daily and only uses her albuterol or nebs as needed.  She continues to see her opthalmologist routinely and was seen on Monday for a subconjunctival hemmhorage. She will continue her prescribed eye gtts and Sustain.  She denies any other health issue that needs to be addressed.  We have agreed to transition to every 3 month calls for general check in. Advised she can call me for any reason between these calls.  [facility] CM Care Plan Problem One     Most Recent Value  Care Plan Problem One  No Advanced Directives  Role Documenting the Problem One  Care Management Coordinator  Care Plan for Problem One  Active  [facility] Long Term Goal   Pt will complete her Advanced Directives in the next 90 days.  [facility] Long Term Goal Start Date  09/18/18  Interventions for Problem One Long Term Goal  Have requested that she put the documents out and discuss this matter with her daughter today and complete the documents. I will ask her again if this has been completed in January. we are transitioning to every 3 mo calls.  [facility] CM Short Term Goal #1   Pt will have discussion with family members within 45 days about her end of life wishes.  [facility] CM Short Term Goal #1 Start Date  09/18/18  Interventions for Short Term Goal #1  See above.  (Pended)   [facility] CM Short Term Goal #2   Pt will acknowledge  receipt of the documents in the next 45 days.  [facility] CM Short Term Goal #2 Start Date  09/18/18  [facility] CM Short Term Goal #2 Met Date  10/24/18    [facility] CM Care Plan Problem Two     Most Recent Value  Care Plan Problem Two  Multiple comorbidities that need monitoring.  Role Documenting the Problem Two  Care Management Coordinator  Care Plan for Problem Two  Not Active  (Pended)   Interventions for Problem Two Long Term Goal   Continue routine monitoring and report any significant fluctuations to NP or MD.  (Pended)   [facility] Long Term Goal  Pt will review BP readings, weights, variations in heart rhythm, variations in respiratory status monthly over the next 70 days.  [facility] Long Term Goal Start Date  09/18/18  [facility] Long Term Goal Met Date  12/22/18  (Pended)      [name] C. [name], MSN, [facility] Gerontological Nurse Practitioner [facility] Care Management [phone]

## 2018-12-24 DIAGNOSIS — J449 Chronic obstructive pulmonary disease, unspecified: Secondary | ICD-10-CM | POA: Diagnosis not present

## 2018-12-25 NOTE — Patient Outreach (Signed)
Telephone assessment.  [name] is doing very well.  She continues to monitor her BP and it is in the acceptable level per her provider to avoid compliciations.  Her wt is steady 194-196. No SOB, minimal edema at times.  [facility] CM Care Plan Problem One     Most Recent Value  Care Plan Problem One  No Advanced Directives  Role Documenting the Problem One  Care Management Coordinator  Care Plan for Problem One  Active  [facility] Long Term Goal   Pt will complete her Advanced Directives in the next 90 days.  [facility] Long Term Goal Start Date  09/18/18  Interventions for Problem One Long Term Goal  Have requested that she put the documents out and discuss this matter with her daughter today and complete the documents. I will ask her again if this has been completed in January. we are transitioning to every 3 mo calls.  [facility] CM Short Term Goal #1   Pt will have discussion with family members within 45 days about her end of life wishes.  [facility] CM Short Term Goal #1 Start Date  09/18/18  Interventions for Short Term Goal #1  See above.  [facility] CM Short Term Goal #2   Pt will acknowledge receipt of the documents in the next 45 days.  [facility] CM Short Term Goal #2 Start Date  09/18/18  [facility] CM Short Term Goal #2 Met Date  10/24/18    [facility] CM Care Plan Problem Two     Most Recent Value  Care Plan Problem Two  Multiple comorbidities that need monitoring.  Role Documenting the Problem Two  Care Management Coordinator  Care Plan for Problem Two  Not Active  Interventions for Problem Two Long Term Goal   Continue routine monitoring and report any significant fluctuations to NP or MD.  [facility] Long Term Goal  Pt will review BP readings, weights, variations in heart rhythm, variations in respiratory status monthly over the next 70 days.  [facility] Long Term Goal Start Date  09/18/18  [facility] Long Term Goal Met Date  12/22/18      I will call her again in three months. She knows she can call me whenever she has a need or  problems. [name]. [name], MSN, [facility] Gerontological Nurse Practitioner [facility] Care Management [phone]

## 2019-01-15 DIAGNOSIS — J449 Chronic obstructive pulmonary disease, unspecified: Secondary | ICD-10-CM | POA: Diagnosis not present

## 2019-01-17 ENCOUNTER — Other Ambulatory Visit: Payer: Self-pay

## 2019-01-17 ENCOUNTER — Encounter: Payer: Self-pay | Admitting: Physician Assistant

## 2019-01-17 ENCOUNTER — Ambulatory Visit: Payer: Medicare Other | Admitting: Physician Assistant

## 2019-01-17 DIAGNOSIS — M19072 Primary osteoarthritis, left ankle and foot: Secondary | ICD-10-CM | POA: Diagnosis not present

## 2019-01-17 MED ORDER — METHYLPREDNISOLONE ACETATE 40 MG/ML IJ SUSP
40.0000 mg | INTRAMUSCULAR | Status: AC | PRN
  Administered 2019-01-17: 14:00:00 40 mg via INTRA_ARTICULAR

## 2019-01-17 MED ORDER — LIDOCAINE HCL 1 % IJ SOLN
1.0000 mL | INTRAMUSCULAR | Status: AC | PRN
  Administered 2019-01-17: 1 mL

## 2019-01-17 NOTE — Progress Notes (Signed)
    Procedure Note  Patient: Katie [name]             Date of Birth: 1940/07/09           MRN: [id number]             Visit Date: 01/17/2019 HPI: [name] comes in today requesting injection in her left ankle.  She states pain began about a week to 2 weeks ago.She is last given injection or left ankle on 10/02/2018.  She has known osteoarthritis of the left ankle.  She is also complaining of neuropathy in her awake at night.  She is on gabapentin her renal physician is asked her to decrease the dosage of her Neurontin.  Review of systems: No fevers chills shortness of breath chest pain.  See HPI  Physical exam bilateral feet dependent rubor which resolves with elevation.  Dorsal pedal pulses are present bilaterally.  She has tenderness about the left ankle but no significant edema.  Slight decreased dorsiflexion left ankle compared to right.  Full plantarflexion both ankles.  Procedures: Visit Diagnoses:  1. Arthritis of ankle, left     Medium Joint Inj on 01/17/2019 2:23 PM Indications: pain Details: anteromedial approach Medications: 1 mL lidocaine 1 %; 40 mg methylPREDNISolone acetate 40 MG/ML   Plan: Recommend that she talk to her primary care physician about her neuropathy.  Again she denies being diabetic.  However with her renal physician clinic decrease her Neurontin dose pills at this time be better addressed by her primary care physician.  Regards to her ankle she denies she continues to wait least 3 months between injections.  Questions encouraged and answered

## 2019-01-29 DIAGNOSIS — I129 Hypertensive chronic kidney disease with stage 1 through stage 4 chronic kidney disease, or unspecified chronic kidney disease: Secondary | ICD-10-CM | POA: Diagnosis not present

## 2019-01-29 DIAGNOSIS — N183 Chronic kidney disease, stage 3 unspecified: Secondary | ICD-10-CM | POA: Diagnosis not present

## 2019-01-29 DIAGNOSIS — I5040 Unspecified combined systolic (congestive) and diastolic (congestive) heart failure: Secondary | ICD-10-CM | POA: Diagnosis not present

## 2019-02-12 ENCOUNTER — Ambulatory Visit (INDEPENDENT_AMBULATORY_CARE_PROVIDER_SITE_OTHER): Payer: Medicare Other | Admitting: *Deleted

## 2019-02-12 DIAGNOSIS — Z95810 Presence of automatic (implantable) cardiac defibrillator: Secondary | ICD-10-CM | POA: Diagnosis not present

## 2019-02-12 LAB — CUP PACEART REMOTE DEVICE CHECK
Date Time Interrogation Session: 20201130091505
Implantable Lead Implant Date: 20190829
Implantable Lead Implant Date: 20190829
Implantable Lead Implant Date: 20190829
Implantable Lead Location: 753858
Implantable Lead Location: 753859
Implantable Lead Location: 753860
Implantable Lead Model: 292
Implantable Lead Model: 4674
Implantable Lead Model: 7740
Implantable Lead Serial Number: 446776
Implantable Lead Serial Number: 696999
Implantable Lead Serial Number: 827497
Implantable Pulse Generator Implant Date: 20190829
Pulse Gen Serial Number: 215092

## 2019-02-14 DIAGNOSIS — J449 Chronic obstructive pulmonary disease, unspecified: Secondary | ICD-10-CM | POA: Diagnosis not present

## 2019-02-26 ENCOUNTER — Encounter: Payer: Self-pay | Admitting: Internal Medicine

## 2019-02-26 NOTE — Patient Instructions (Signed)

## 2019-02-26 NOTE — Progress Notes (Signed)
Annual Screening/Preventative Visit & Comprehensive Evaluation &  Examination     This very nice 78 y.o. MWF presents for a Screening /Preventative Visit & comprehensive evaluation and management of multiple medical co-morbidities.  Patient has been followed for HTN, HLD, ASHD/cAfib, chHF with an BiVent AICD, CKD3, Prediabetes, Hypothyroidism and Vitamin D Deficiency. She has hx/o Gout controlled on Allopurinol and also has recent dx/o Rheumatoid Arthritis. 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 Today patient is also c/o of sensations of burning & stinging in distal legs & feet.       HTN predates circa 1999.  Patient's BP has been controlled at home and patient denies any cardiac symptoms as chest pain, palpitations, shortness of breath, dizziness or ankle swelling. Today's BP is at goal - 140/84.  In 1999, she was dx'd w/ Ch Afib and started on Coumadin continued til switched to Hominy in 2019.  In 2015, she had a AAA endovascular stent / graft. In Sept 2019, she was dx'd with sys/dias combined CHF and had a BiV AICD implanted by Dr Lovena Le.      Patient's hyperlipidemia is not controlled with diet and medications. Patient denies myalgias or other medication SE's. Last lipids were not at goal with elevated LDL & elevated Trig's:  Lab Results  Component Value Date   CHOL 164 11/23/2018   HDL 33 (L) 11/23/2018   LDLCALC 94 11/23/2018   TRIG 243 (H) 11/23/2018   CHOLHDL 5.0 (H) 11/23/2018      Patient has moderate obesity (BMI 36+) and  hx/o prediabetes (A1c 5.7% / 2013) and patient denies reactive hypoglycemic symptoms, visual blurring, diabetic polys or paresthesias. Last A1c was Normal & at goal:  Lab Results  Component Value Date   HGBA1C 5.5 01/05/2018        Patient was dx'd Hypothyroid  in 2012 and started on Thyroid Replacement.      Finally, patient has history of Vitamin D Deficiency ("33" / 2008)  and last  Vitamin D was at goal:  Lab Results  Component Value Date   VD25OH 66 01/05/2018   Current Outpatient Medications on File Prior to Visit  Medication Sig  . acetaminophen (TYLENOL) 500 MG tablet Take 1,000 mg by mouth every 6 (six) hours as needed for moderate pain.   Marland Kitchen albuterol (PROAIR HFA) 108 (90 Base) MCG/ACT inhaler Use 2 Inhalations 15 minutes Apart every 4 hours to Rescue Asthma Attack (Patient taking differently: Inhale 2 puffs into the lungs every 4 (four) hours as needed for wheezing or shortness of breath. )  . allopurinol (ZYLOPRIM) 300 MG tablet Take 1 tablet (300 mg total) by mouth daily.  Marland Kitchen aspirin EC 81 MG tablet Take 81 mg by mouth daily.  . benzonatate (TESSALON) 200 MG capsule Take 1 capsule 3 x /day as need for Cough  . cetirizine (ZYRTEC) 10 MG tablet Take 10 mg by mouth daily as needed for allergies.   Marland Kitchen digoxin (LANOXIN) 0.125 MG tablet Take 1 tablet (0.125 mg total) by mouth daily.  . Eyelid Cleansers (AVENOVA) 0.01 % SOLN Apply 1 application topically See admin instructions. Wash eyelids twice daily with cleaner  . gabapentin (NEURONTIN) 600 MG tablet Take 1 tablet 3 x /Day for Pain (Patient taking differently: Take 600 mg by mouth 2 (two) times daily. )  . guaiFENesin (MUCINEX) 600 MG 12 hr tablet Take 600 mg by mouth daily as needed for cough or to loosen phlegm.   Marland Kitchen  ketoconazole (NIZORAL) 2 % cream Apply to Skin Fungus / Ringworm 2 x /day  . leflunomide (ARAVA) 10 MG tablet Take 10 mg by mouth daily.   Marland Kitchen levothyroxine (SYNTHROID) 100 MCG tablet TAKE 1 TABLET DAILY ON AN EMPTY STOMACH WITH ONLY WATER FOR 30 MINUTES. (Patient taking differently: Take 100 mcg by mouth daily before breakfast. )  . pantoprazole (PROTONIX) 40 MG tablet Take 1 tablet (40 mg total) by mouth daily.  [name] Glycol-Propyl Glycol (SYSTANE) 0.4-0.3 % SOLN Place 1 drop into both eyes 4 (four) times daily as needed (for dry eyes).   . potassium chloride SA (KLOR-CON M20) 20 MEQ tablet Take 2  tablets Daily for Potassium  . pravastatin (PRAVACHOL) 40 MG tablet TAKE 1/2 TABLET BY MOUTH AT BEDTIME FOR CHOLESTEROL (Patient taking differently: Take 20 mg by mouth at bedtime. )  . prednisoLONE acetate (PRED FORTE) 1 % ophthalmic suspension Place 1 drop into both eyes 2 (two) times daily.   . prochlorperazine (COMPAZINE) 5 MG tablet TAKE 1 TABLET BY MOUTH THREE TIMES A DAY AS NEEDED FOR VERTIGO OR NAUSEA  . Rivaroxaban (XARELTO) 15 MG TABS tablet Take 1 tablet Daily for A fib (Patient taking differently: Take 15 mg by mouth daily with supper. )  . torsemide (DEMADEX) 20 MG tablet Take 2 tablets (40 mg total) by mouth 2 (two) times daily.  Marland Kitchen triamcinolone cream (KENALOG) 0.1 % Apply 1 application topically daily as needed (for dry skin).   Marland Kitchen umeclidinium-vilanterol (ANORO ELLIPTA) 62.5-25 MCG/INH AEPB Inhale 1 puff into the lungs daily.  . Cholecalciferol (VITAMIN D3) 5000 units CAPS Take 5,000 Units by mouth daily.    No current facility-administered medications on file prior to visit.   Allergies  Allergen Reactions  . Amiodarone Other (See Comments)    PULMONARY TOXICITY  . Diovan [Valsartan] Other (See Comments)    HYPOTENSION  . Doxycycline Diarrhea and Other (See Comments)    VISUAL DISTURBANCE  . Flexeril [Cyclobenzaprine] Other (See Comments)    FATIGUE  . Keflex [Cephalexin] Diarrhea  . Verapamil Other (See Comments)    EDEMA  . Codeine Hives   Past Medical History:  Diagnosis Date  . AAA (abdominal aortic aneurysm) ([place])   . AICD (automatic cardioverter/defibrillator) present 11/10/2017  . Arthritis    "some in my knees" (11/10/2017)  . Atrial fibrillation ([place])   . CHF (congestive heart failure) ([place])   . Chronic bronchitis ([place])   . COPD (chronic obstructive pulmonary disease) ([place])    'they say I don't; have a problem breathing though" (11/10/2017)  . Depression   . GERD (gastroesophageal reflux disease)   . Gout    "daily RX" (11/10/2017)  . History of hiatal  hernia   . Hyperlipidemia   . Hypertension   . Hypothyroid   . Migraine headache    "hx; none since 1980s" (11/10/2017)  . Osteopenia   . Pneumonia    "~ 3 times" (11/10/2017)  . PVD (peripheral vascular disease) [facility])    Health Maintenance  Topic Date Due  . DEXA SCAN  02/28/2023  . TETANUS/TDAP  08/12/2027  . INFLUENZA VACCINE  Completed  . PNA vac Low Risk Adult  Completed   Immunization History  Administered Date(s) Administered  . Influenza, High Dose Seasonal PF 12/20/2013, 12/09/2014, 11/14/2015, 01/10/2017, 12/02/2017, 11/23/2018  . Influenza-Unspecified 01/02/2013  . PPD Test 07/17/2016  . Pneumococcal Conjugate-13 01/23/2014  . Pneumococcal-Unspecified 03/15/1993, 05/31/2008  . Td 03/16/2007  . Tdap 08/11/2017  . Varicella 02/19/2008  Last Colon - 07/26/2002 - Dr Delfin Edis - Recc 10 yr f/u (refused in 2014)   Last MGM - 02/27/2018 -  Past Surgical History:  Procedure Laterality Date  . ABDOMINAL AORTIC ENDOVASCULAR STENT GRAFT N/A 04/11/2013   Procedure: ABDOMINAL AORTIC ENDOVASCULAR STENT GRAFT WITH RIGHT FEMORAL PATCH ANGIOPLASTY;  Surgeon: Mal Misty, MD;  Location: [facility];  Service: Vascular;  Laterality: N/A;  . AV NODE ABLATION N/A 09/13/2018   Procedure: AV NODE ABLATION;  Surgeon: Evans Lance, MD;  Location: [place] [facility];  Service: Cardiovascular;  Laterality: N/A;  . BIV ICD INSERTION CRT-D  11/10/2017  . BIV ICD INSERTION CRT-D N/A 11/10/2017   Procedure: BIV ICD INSERTION CRT-D;  Surgeon: Evans Lance, MD;  Location: [facility];  Service: Cardiovascular;  Laterality: N/A;  . BREAST SURGERY     LEFT BREAST BIOPSY  . CARDIAC CATHETERIZATION    . CATARACT EXTRACTION W/ INTRAOCULAR LENS  IMPLANT, BILATERAL Bilateral   . CHOLECYSTECTOMY OPEN  1972  . COLONOSCOPY    . EYE SURGERY Bilateral    "bleeding in my eyes"  . FRACTURE SURGERY    . TIBIA FRACTURE SURGERY Left 1970s  . TONSILLECTOMY    . VARICOSE VEIN SURGERY Bilateral     "laser"   Family History  Problem Relation Age of Onset  . Cirrhosis Mother   . Cancer Mother 58       PANCREAS  . Heart defect Sister   . Breast cancer Sister        age 26  . Heart disease Sister   . Stroke Sister   . Alcohol abuse Father   . Depression Father   . Hypertension Brother   . Hyperlipidemia Son   . Heart disease Daughter    Social History   Tobacco Use  . Smoking status: Former Smoker    Packs/day: 2.00    Years: 54.00    Pack years: 108.00    Types: Cigarettes    Quit date: 08/10/2002    Years since quitting: 16.5  . Smokeless tobacco: Never Used  Substance Use Topics  . Alcohol use: Never  . Drug use: Never    ROS Constitutional: Denies fever, chills, weight loss/gain, headaches, insomnia,  night sweats, and change in appetite. Does c/o fatigue. Eyes: Denies redness, blurred vision, diplopia, discharge, itchy, watery eyes.  ENT: Denies discharge, congestion, post nasal drip, epistaxis, sore throat, earache, hearing loss, dental pain, Tinnitus, Vertigo, Sinus pain, snoring.  Cardio: Denies chest pain, palpitations, irregular heartbeat, syncope, dyspnea, diaphoresis, orthopnea, PND, claudication, edema Respiratory: denies cough, dyspnea, DOE, pleurisy, hoarseness, laryngitis, wheezing.  Gastrointestinal: Denies dysphagia, heartburn, reflux, water brash, pain, cramps, nausea, vomiting, bloating, diarrhea, constipation, hematemesis, melena, hematochezia, jaundice, hemorrhoids Genitourinary: Denies dysuria, frequency, urgency, nocturia, hesitancy, discharge, hematuria, flank pain Breast: Breast lumps, nipple discharge, bleeding.  Musculoskeletal: Denies arthralgia, myalgia, stiffness, Jt. Swelling, pain, limp, and strain/sprain. Denies falls. Skin: Denies puritis, rash, hives, warts, acne, eczema, changing in skin lesion Neuro: No weakness, tremor, incoordination, spasms, paresthesia, pain Psychiatric: Denies confusion, memory loss, sensory loss. Denies  Depression. Endocrine: Denies change in weight, skin, hair change, nocturia, and paresthesia, diabetic polys, visual blurring, hyper / hypo glycemic episodes.  Heme/Lymph: No excessive bleeding, bruising, enlarged lymph nodes.  Physical Exam  BP 140/84   Pulse 76   Temp (!) 97.2 F (36.2 C)   Resp 16   Ht 5' 3.5" (1.613 m)   Wt 196 lb (88.9 kg)  BMI 34.18 kg/m   General Appearance: Over nourished, well groomed and in no apparent distress.  Eyes: PERRLA, EOMs, conjunctiva no swelling or erythema, normal fundi and vessels. Sinuses: No frontal/maxillary tenderness ENT/Mouth: EACs patent / TMs  nl. Nares clear without erythema, swelling, mucoid exudates. Oral hygiene is good. No erythema, swelling, or exudate. Tongue normal, non-obstructing. Tonsils not swollen or erythematous. Hearing normal.  Neck: Supple, thyroid not palpable. No bruits, nodes or JVD. Respiratory: Respiratory effort normal.  BS equal and clear bilateral without rales, rhonci, wheezing or stridor. Cardio: Heart sounds are normal with regular rate and rhythm and no murmurs, rubs or gallops. Peripheral pulses are normal and equal bilaterally without edema. No aortic or femoral bruits. Chest: symmetric with normal excursions and percussion. Breasts: Symmetric, without lumps, nipple discharge, retractions, or fibrocystic changes.  Abdomen: Flat, soft with bowel sounds active. Nontender, no guarding, rebound, hernias, masses, or organomegaly.  Lymphatics: Non tender without lymphadenopathy.  Musculoskeletal: Full ROM all peripheral extremities, joint stability, 5/5 strength, and normal gait. Skin: Warm and dry without rashes, lesions, cyanosis, clubbing or  ecchymosis.  Neuro: Cranial nerves intact, reflexes equal bilaterally. Normal muscle tone, no cerebellar symptoms. Sensation sl decreased to touch, vibratory and Monofilament to the toes bilaterally.  Pysch: Alert and oriented X 3, normal affect, Insight and Judgment  appropriate.   Assessment and Plan  1. Annual Preventative Screening Examination   2. Essential hypertension  - EKG 12-Lead - Urinalysis, Routine w reflex microscopic - Microalbumin / Creatinine Urine Ratio - CBC with Diff - COMPLETE METABOLIC PANEL WITH GFR - Magnesium - TSH  3. Hyperlipidemia, mixed  - EKG 12-Lead - Lipid Profile  4. Abnormal glucose  - EKG 12-Lead - Hemoglobin A1c ([facility]) - Insulin, random  5. Vitamin D deficiency  - Vitamin D (25 hydroxy)  6. Coronary artery disease due to lipid rich plaque  - EKG 12-Lead  7. Chronic combined systolic and diastolic CHF (congestive heart failure) (H[facility])  - EKG 12-Lead  8. Chronic atrial fibrillation ([facility])  - EKG 12-Lead - Digoxin level  9. Chronic obstructive pulmonary disease with hypoxia ([facility])   10. PVD (peripheral vascular disease) with claudication ([facility])  - EKG 12-Lead  11. Gout  - Uric acid  12. Stage 3a chronic kidney disease  - COMPLETE METABOLIC PANEL WITH GFR  13. Hypothyroidism  - TSH  14. Screening for ischemic heart disease  - EKG 12-Lead  15. FHx: heart disease  - EKG 12-Lead  16. Aortic atherosclerosis ([facility])  - EKG 12-Lead  17. AAA (abdominal aortic aneurysm) without rupture ([facility])  - EKG 12-Lead  18. Screening for AAA (aortic abdominal aneurysm)  - EKG 12-Lead  19. Medication management  - Urinalysis, Routine w reflex microscopic - Microalbumin / Creatinine Urine Ratio - Uric acid - CBC with Diff - COMPLETE METABOLIC PANEL WITH GFR - Magnesium - Lipid Profile - TSH - Hemoglobin A1c ([facility]) - Insulin, random - Vitamin D (25 hydroxy) - Digoxin level  20. Screening for colorectal cancer  - POC Hemoccult Bld/Stl        Patient was counseled in prudent diet to achieve/maintain BMI less than 25 for weight control, BP monitoring, regular exercise and medications. Discussed med's effects and SE's. Screening labs and tests as requested with regular  follow-up as recommended. Over 40 minutes of exam, counseling, chart review and high complex critical decision making was performed.   Kirtland Bouchard, MD

## 2019-02-27 ENCOUNTER — Other Ambulatory Visit: Payer: Self-pay | Admitting: *Deleted

## 2019-02-27 ENCOUNTER — Other Ambulatory Visit: Payer: Self-pay | Admitting: Internal Medicine

## 2019-02-27 ENCOUNTER — Other Ambulatory Visit: Payer: Self-pay

## 2019-02-27 ENCOUNTER — Encounter: Payer: Self-pay | Admitting: Internal Medicine

## 2019-02-27 ENCOUNTER — Ambulatory Visit (INDEPENDENT_AMBULATORY_CARE_PROVIDER_SITE_OTHER): Payer: Medicare Other | Admitting: Internal Medicine

## 2019-02-27 VITALS — BP 140/84 | HR 76 | Temp 97.2°F | Resp 16 | Ht 63.5 in | Wt 196.0 lb

## 2019-02-27 DIAGNOSIS — E782 Mixed hyperlipidemia: Secondary | ICD-10-CM

## 2019-02-27 DIAGNOSIS — I739 Peripheral vascular disease, unspecified: Secondary | ICD-10-CM

## 2019-02-27 DIAGNOSIS — M109 Gout, unspecified: Secondary | ICD-10-CM

## 2019-02-27 DIAGNOSIS — R7309 Other abnormal glucose: Secondary | ICD-10-CM

## 2019-02-27 DIAGNOSIS — E559 Vitamin D deficiency, unspecified: Secondary | ICD-10-CM

## 2019-02-27 DIAGNOSIS — Z0001 Encounter for general adult medical examination with abnormal findings: Secondary | ICD-10-CM

## 2019-02-27 DIAGNOSIS — J310 Chronic rhinitis: Secondary | ICD-10-CM

## 2019-02-27 DIAGNOSIS — Z8249 Family history of ischemic heart disease and other diseases of the circulatory system: Secondary | ICD-10-CM

## 2019-02-27 DIAGNOSIS — I714 Abdominal aortic aneurysm, without rupture, unspecified: Secondary | ICD-10-CM

## 2019-02-27 DIAGNOSIS — N950 Postmenopausal bleeding: Secondary | ICD-10-CM

## 2019-02-27 DIAGNOSIS — I10 Essential (primary) hypertension: Secondary | ICD-10-CM | POA: Diagnosis not present

## 2019-02-27 DIAGNOSIS — G609 Hereditary and idiopathic neuropathy, unspecified: Secondary | ICD-10-CM

## 2019-02-27 DIAGNOSIS — I2510 Atherosclerotic heart disease of native coronary artery without angina pectoris: Secondary | ICD-10-CM

## 2019-02-27 DIAGNOSIS — M069 Rheumatoid arthritis, unspecified: Secondary | ICD-10-CM | POA: Diagnosis not present

## 2019-02-27 DIAGNOSIS — M79642 Pain in left hand: Secondary | ICD-10-CM | POA: Diagnosis not present

## 2019-02-27 DIAGNOSIS — Z0000 Encounter for general adult medical examination without abnormal findings: Secondary | ICD-10-CM | POA: Diagnosis not present

## 2019-02-27 DIAGNOSIS — N1831 Chronic kidney disease, stage 3a: Secondary | ICD-10-CM

## 2019-02-27 DIAGNOSIS — Z95810 Presence of automatic (implantable) cardiac defibrillator: Secondary | ICD-10-CM

## 2019-02-27 DIAGNOSIS — I700 Atherosclerosis of aorta: Secondary | ICD-10-CM

## 2019-02-27 DIAGNOSIS — Z136 Encounter for screening for cardiovascular disorders: Secondary | ICD-10-CM | POA: Diagnosis not present

## 2019-02-27 DIAGNOSIS — Z1211 Encounter for screening for malignant neoplasm of colon: Secondary | ICD-10-CM

## 2019-02-27 DIAGNOSIS — I2583 Coronary atherosclerosis due to lipid rich plaque: Secondary | ICD-10-CM

## 2019-02-27 DIAGNOSIS — I5042 Chronic combined systolic (congestive) and diastolic (congestive) heart failure: Secondary | ICD-10-CM

## 2019-02-27 DIAGNOSIS — E039 Hypothyroidism, unspecified: Secondary | ICD-10-CM | POA: Diagnosis not present

## 2019-02-27 DIAGNOSIS — M549 Dorsalgia, unspecified: Secondary | ICD-10-CM | POA: Diagnosis not present

## 2019-02-27 DIAGNOSIS — J4540 Moderate persistent asthma, uncomplicated: Secondary | ICD-10-CM

## 2019-02-27 DIAGNOSIS — Z79899 Other long term (current) drug therapy: Secondary | ICD-10-CM

## 2019-02-27 DIAGNOSIS — M25559 Pain in unspecified hip: Secondary | ICD-10-CM | POA: Diagnosis not present

## 2019-02-27 DIAGNOSIS — T7840XS Allergy, unspecified, sequela: Secondary | ICD-10-CM

## 2019-02-27 DIAGNOSIS — R0902 Hypoxemia: Secondary | ICD-10-CM

## 2019-02-27 DIAGNOSIS — J449 Chronic obstructive pulmonary disease, unspecified: Secondary | ICD-10-CM

## 2019-02-27 DIAGNOSIS — I482 Chronic atrial fibrillation, unspecified: Secondary | ICD-10-CM

## 2019-02-27 DIAGNOSIS — M1990 Unspecified osteoarthritis, unspecified site: Secondary | ICD-10-CM | POA: Diagnosis not present

## 2019-02-27 DIAGNOSIS — Z1212 Encounter for screening for malignant neoplasm of rectum: Secondary | ICD-10-CM

## 2019-02-27 MED ORDER — IPRATROPIUM BROMIDE 0.06 % NA SOLN: NASAL | 3 refills | Status: AC

## 2019-02-27 MED ORDER — AMITRIPTYLINE HCL 25 MG PO TABS: ORAL_TABLET | ORAL | 0 refills | Status: DC

## 2019-02-27 MED ORDER — IPRATROPIUM-ALBUTEROL 0.5-2.5 (3) MG/3ML IN SOLN: 3.0000 mL | Freq: Four times a day (QID) | RESPIRATORY_TRACT | 2 refills | Status: AC | PRN

## 2019-02-28 ENCOUNTER — Other Ambulatory Visit: Payer: Self-pay

## 2019-02-28 LAB — COMPLETE METABOLIC PANEL WITH GFR
AG Ratio: 1.6 (calc) (ref 1.0–2.5)
ALT: 13 U/L (ref 6–29)
AST: 21 U/L (ref 10–35)
Albumin: 3.7 g/dL (ref 3.6–5.1)
Alkaline phosphatase (APISO): 46 U/L (ref 37–153)
BUN/Creatinine Ratio: 15 (calc) (ref 6–22)
BUN: 24 mg/dL (ref 7–25)
CO2: 28 mmol/L (ref 20–32)
Calcium: 9.9 mg/dL (ref 8.6–10.4)
Chloride: 102 mmol/L (ref 98–110)
Creat: 1.58 mg/dL — ABNORMAL HIGH (ref 0.60–0.93)
GFR, Est African American: 36 mL/min/{1.73_m2} — ABNORMAL LOW (ref >=60)
GFR, Est Non African American: 31 mL/min/{1.73_m2} — ABNORMAL LOW (ref >=60)
Globulin: 2.3 g/dL (calc) (ref 1.9–3.7)
Glucose, Bld: 95 mg/dL (ref 65–99)
Potassium: 3.9 mmol/L (ref 3.5–5.3)
Sodium: 142 mmol/L (ref 135–146)
Total Bilirubin: 0.6 mg/dL (ref 0.2–1.2)
Total Protein: 6 g/dL — ABNORMAL LOW (ref 6.1–8.1)

## 2019-02-28 LAB — URINALYSIS, ROUTINE W REFLEX MICROSCOPIC
Bilirubin Urine: NEGATIVE
Glucose, UA: NEGATIVE
Hgb urine dipstick: NEGATIVE
Ketones, ur: NEGATIVE
Leukocytes,Ua: NEGATIVE
Nitrite: NEGATIVE
Protein, ur: NEGATIVE
Specific Gravity, Urine: 1.018 (ref 1.001–1.03)
pH: <=5 (ref 5.0–8.0)

## 2019-02-28 LAB — CBC WITH DIFFERENTIAL/PLATELET
Absolute Monocytes: 898 cells/uL (ref 200–950)
Basophils Absolute: 68 cells/uL (ref 0–200)
Basophils Relative: 1 %
Eosinophils Absolute: 340 cells/uL (ref 15–500)
Eosinophils Relative: 5 %
HCT: 46.5 % — ABNORMAL HIGH (ref 35.0–45.0)
Hemoglobin: 15.2 g/dL (ref 11.7–15.5)
Lymphs Abs: 1897 cells/uL (ref 850–3900)
MCH: 28.5 pg (ref 27.0–33.0)
MCHC: 32.7 g/dL (ref 32.0–36.0)
MCV: 87.2 fL (ref 80.0–100.0)
MPV: 11.3 fL (ref 7.5–12.5)
Monocytes Relative: 13.2 %
Neutro Abs: 3597 cells/uL (ref 1500–7800)
Neutrophils Relative %: 52.9 %
Platelets: 137 10*3/uL — ABNORMAL LOW (ref 140–400)
RBC: 5.33 10*6/uL — ABNORMAL HIGH (ref 3.80–5.10)
RDW: 14.4 % (ref 11.0–15.0)
Total Lymphocyte: 27.9 %
WBC: 6.8 10*3/uL (ref 3.8–10.8)

## 2019-02-28 LAB — MAGNESIUM: Magnesium: 2.3 mg/dL (ref 1.5–2.5)

## 2019-02-28 LAB — MICROALBUMIN / CREATININE URINE RATIO
Creatinine, Urine: 127 mg/dL (ref 20–275)
Microalb Creat Ratio: 27 mcg/mg creat (ref <30)
Microalb, Ur: 3.4 mg/dL

## 2019-02-28 LAB — VITAMIN D 25 HYDROXY (VIT D DEFICIENCY, FRACTURES): Vit D, 25-Hydroxy: 98 ng/mL (ref 30–100)

## 2019-02-28 LAB — TSH: TSH: 7.88 mIU/L — ABNORMAL HIGH (ref 0.40–4.50)

## 2019-02-28 LAB — HEMOGLOBIN A1C
Hgb A1c MFr Bld: 5.7 % of total Hgb — ABNORMAL HIGH (ref <5.7)
Mean Plasma Glucose: 117 (calc)
eAG (mmol/L): 6.5 (calc)

## 2019-02-28 LAB — LIPID PANEL
Cholesterol: 133 mg/dL (ref <200)
HDL: 25 mg/dL — ABNORMAL LOW (ref >=50)
LDL Cholesterol (Calc): 81 mg/dL (calc)
Non-HDL Cholesterol (Calc): 108 mg/dL (calc) (ref <130)
Total CHOL/HDL Ratio: 5.3 (calc) — ABNORMAL HIGH (ref <5.0)
Triglycerides: 175 mg/dL — ABNORMAL HIGH (ref <150)

## 2019-02-28 LAB — INSULIN, RANDOM: Insulin: 6.7 u[IU]/mL

## 2019-02-28 LAB — URIC ACID: Uric Acid, Serum: 6.3 mg/dL (ref 2.5–7.0)

## 2019-02-28 LAB — DIGOXIN LEVEL: Digoxin Level: 0.6 mcg/L — ABNORMAL LOW (ref 0.8–2.0)

## 2019-03-01 ENCOUNTER — Encounter: Payer: Self-pay | Admitting: Obstetrics & Gynecology

## 2019-03-01 ENCOUNTER — Ambulatory Visit: Payer: Medicare Other | Admitting: Obstetrics & Gynecology

## 2019-03-01 VITALS — BP 132/80 | Ht 63.0 in | Wt 193.0 lb

## 2019-03-01 DIAGNOSIS — N950 Postmenopausal bleeding: Secondary | ICD-10-CM

## 2019-03-01 DIAGNOSIS — N898 Other specified noninflammatory disorders of vagina: Secondary | ICD-10-CM | POA: Diagnosis not present

## 2019-03-01 LAB — WET PREP FOR TRICH, YEAST, CLUE

## 2019-03-01 NOTE — Addendum Note (Signed)
Addended by: Princess Bruins on: 03/01/2019 04:15 PM   Modules accepted: Orders

## 2019-03-01 NOTE — Progress Notes (Addendum)
Katie [name] 06-05-1940 [id number]   History:    78 y.o. [id number] Married  RP:  PMB x 1 on Thanksgiving day  HPI: Postmenopausal for many years, on no HRT.  PMB x 1 on Thanksgiving day.  No PMB since then.  No Pelvic Pain.  Patient is on Xeralto.  Had PMB in 04/2014 while on Warfarin and the Pelvic US at that time showed a thin endometrial line at 2.7 mm, so no further investigation was done.  Past medical history,surgical history, family history and social history were all reviewed and documented in the EPIC chart.  Gynecologic History No LMP recorded. Patient is postmenopausal.  Obstetric History OB History  Gravida Para Term Preterm AB Living  4 3 3   1 3   SAB TAB Ectopic Multiple Live Births      0        # Outcome Date GA Lbr Len/2nd Weight Sex Delivery Anes PTL Lv  4 AB           3 Term           2 Term           1 Term              ROS: A ROS was performed and pertinent positives and negatives are included in the history.  GENERAL: No fevers or chills. HEENT: No change in vision, no earache, sore throat or sinus congestion. NECK: No pain or stiffness. CARDIOVASCULAR: No chest pain or pressure. No palpitations. PULMONARY: No shortness of breath, cough or wheeze. GASTROINTESTINAL: No abdominal pain, nausea, vomiting or diarrhea, melena or bright red blood per rectum. GENITOURINARY: No urinary frequency, urgency, hesitancy or dysuria. MUSCULOSKELETAL: No joint or muscle pain, no back pain, no recent trauma. DERMATOLOGIC: No rash, no itching, no lesions. ENDOCRINE: No polyuria, polydipsia, no heat or cold intolerance. No recent change in weight. HEMATOLOGICAL: No anemia or easy bruising or bleeding. NEUROLOGIC: No headache, seizures, numbness, tingling or weakness. PSYCHIATRIC: No depression, no loss of interest in normal activity or change in sleep pattern.     Exam:   BP 132/80 (BP Location: Right Arm, Patient Position: Sitting, Cuff Size: Normal)   Ht 5\' 3"  (1.6 m)   Wt  193 lb (87.5 kg)   BMI 34.19 kg/m   Body mass index is 34.19 kg/m.  General appearance : Well developed well nourished female. No acute distress HEENT: Eyes: no retinal hemorrhage or exudates,  Neck supple, trachea midline, no carotid bruits, no thyroidmegaly Lungs: Clear to auscultation, no rhonchi or wheezes, or rib retractions  Heart: Regular rate and rhythm, no murmurs or gallops Breast:Examined in sitting and supine position were symmetrical in appearance, no palpable masses or tenderness,  no skin retraction, no nipple inversion, no nipple discharge, no skin discoloration, no axillary or supraclavicular lymphadenopathy Abdomen: no palpable masses or tenderness, no rebound or guarding Extremities: no edema or skin discoloration or tenderness  Pelvic: Vulva: Normal             Vagina: No gross lesions or discharge  Cervix: No gross lesions or discharge.  No blood.  Uterus  AV, normal size, shape and consistency, non-tender and mobile  Adnexa  Without masses or tenderness  Anus: Normal  Wet prep negative   Assessment/Plan:  78 y.o. female   1. Postmenopausal bleeding PMB x 1 on Thanksgiving Day 2020, no PMB since then, no pelvic pain and normal gynecologic exam today.  Will f/u with a Pelvic  US to further investigate.  Patient voiced understanding and agreement with plan. - US Transvaginal Non-OB; Future  2. Vaginal discharge Wet prep negative. - WET PREP FOR TRICH, YEAST, CLUE  Counseling on above issues and coordination of care >50% x 30 minutes  Princess Bruins MD, 3:35 PM 03/01/2019

## 2019-03-01 NOTE — Patient Instructions (Signed)
1. Postmenopausal bleeding PMB x 1 on Thanksgiving Day 2020, no PMB since then, no pelvic pain and normal gynecologic exam today.  Will f/u with a Pelvic US to further investigate.  Patient voiced understanding and agreement with plan. - US Transvaginal Non-OB; Future  [name], it was a pleasure meeting you today!

## 2019-03-04 ENCOUNTER — Other Ambulatory Visit: Payer: Self-pay | Admitting: Internal Medicine

## 2019-03-04 DIAGNOSIS — E039 Hypothyroidism, unspecified: Secondary | ICD-10-CM

## 2019-03-07 NOTE — Progress Notes (Signed)
ICD remote 

## 2019-03-17 DIAGNOSIS — J449 Chronic obstructive pulmonary disease, unspecified: Secondary | ICD-10-CM | POA: Diagnosis not present

## 2019-03-21 ENCOUNTER — Other Ambulatory Visit: Payer: Self-pay | Admitting: Internal Medicine

## 2019-03-21 DIAGNOSIS — G609 Hereditary and idiopathic neuropathy, unspecified: Secondary | ICD-10-CM

## 2019-03-21 MED ORDER — AZITHROMYCIN 250 MG PO TABS: ORAL_TABLET | ORAL | 0 refills | Status: DC

## 2019-03-24 DIAGNOSIS — Z1231 Encounter for screening mammogram for malignant neoplasm of breast: Secondary | ICD-10-CM | POA: Diagnosis not present

## 2019-03-26 ENCOUNTER — Other Ambulatory Visit: Payer: Self-pay | Admitting: *Deleted

## 2019-03-26 NOTE — Patient Outreach (Signed)
Quarterly telephone outreach for follow up chronic illnesses.  Mrs. Zajkowski reports she is doing well. She has had her AWV and has had a gynecological eval for X1 post menstrual bleeding on Thanksgiving day. No new dx or problems were identified. Pt continues to follow her Action Plans for HF and COPD. She is taking all her meds as ordered.  Encouraged her to call her MD for any new problems.  Updated care plan GOAL for quarterly assessments:  PT WILL FOLLOW HER HF AND COPD ACTION PLANS AND CALL MD FOR ANY SXS OR ANY OTHER MEDICAL PROBLEMS THAT ARISE EARLY FOR INTERVENTION AND PREVENTION OF COMPLICATIONS AND HOSPITALIZIATION.  I will call her again in 3 months.  [name]. [name], MSN, [facility] Gerontological Nurse Practitioner [facility] Care Management [phone]

## 2019-04-04 ENCOUNTER — Telehealth: Payer: Self-pay | Admitting: *Deleted

## 2019-04-04 ENCOUNTER — Other Ambulatory Visit: Payer: Self-pay

## 2019-04-04 NOTE — Telephone Encounter (Signed)
Patient asked appointment desk if full bladder is needed for ultrasound scheduled for tomorrow. I tried to call patient and tell her NO since ultrasound was ordered for postmenopausal bleeding, however her voicemail was full.

## 2019-04-05 ENCOUNTER — Ambulatory Visit: Payer: Medicare Other | Admitting: Obstetrics & Gynecology

## 2019-04-05 ENCOUNTER — Ambulatory Visit (INDEPENDENT_AMBULATORY_CARE_PROVIDER_SITE_OTHER): Payer: Medicare Other

## 2019-04-05 DIAGNOSIS — N950 Postmenopausal bleeding: Secondary | ICD-10-CM | POA: Diagnosis not present

## 2019-04-05 NOTE — Progress Notes (Signed)
    Katie [name] 09-10-1940 [id number]        79 y.o.  [id number] Married  RP: PMB for Pelvic US  HPI: PMB x 1 day at Thanksgiving, was cooking a lot, not sexually active around that time.  On Xeralto.  No PMB since then.  No pelvic pain.  Rarely sexually active, about twice a year for special events.   OB History  Gravida Para Term Preterm AB Living  4 3 3   1 3   SAB TAB Ectopic Multiple Live Births      0        # Outcome Date GA Lbr Len/2nd Weight Sex Delivery Anes PTL Lv  4 AB           3 Term           2 Term           1 Term             Past medical history,surgical history, problem list, medications, allergies, family history and social history were all reviewed and documented in the EPIC chart.   Directed ROS with pertinent positives and negatives documented in the history of present illness/assessment and plan.  Exam:  There were no vitals filed for this visit. General appearance:  Normal  Pelvic US today: T/V images.  Small anteverted uterus normal in size and shape with no myometrial mass.  The uterus is measured at 6.61 x 4.10 x 2.74 cm.  The endometrial lining is thin and symmetrical measured at 1.5 mm.  No mass or thickening or abnormal blood flow seen at that level.  Both ovaries are small with atrophic appearance.  No adnexal mass seen.  No free fluid in the posterior cul-de-sac.   Assessment/Plan:  79 y.o. [id number]   1. Postmenopausal bleeding 1 episode of mild postmenopausal bleeding at Thanksgiving 2020 with no recurrence since then.  Patient is on Xarelto.  Pelvic ultrasound findings thoroughly reviewed with patient today.  Patient reassured that her uterus is normal in size and appearance with a thin endometrial lining at 1.5 mm.  No mass or thickening or abnormal flow seen at the level of the endometrium.  Both ovaries are normal with no adnexal mass.    Princess Bruins MD, 10:39 AM 04/05/2019

## 2019-04-08 ENCOUNTER — Encounter: Payer: Self-pay | Admitting: Obstetrics & Gynecology

## 2019-04-08 NOTE — Patient Instructions (Signed)
1. Postmenopausal bleeding 1 episode of mild postmenopausal bleeding at Thanksgiving 2020 with no recurrence since then.  Patient is on Xarelto.  Pelvic ultrasound findings thoroughly reviewed with patient today.  Patient reassured that her uterus is normal in size and appearance with a thin endometrial lining at 1.5 mm.  No mass or thickening or abnormal flow seen at the level of the endometrium.  Both ovaries are normal with no adnexal mass.    Katie [name], it was a pleasure seeing you today!

## 2019-04-16 ENCOUNTER — Other Ambulatory Visit: Payer: Self-pay | Admitting: Adult Health

## 2019-04-17 DIAGNOSIS — J449 Chronic obstructive pulmonary disease, unspecified: Secondary | ICD-10-CM | POA: Diagnosis not present

## 2019-04-24 ENCOUNTER — Other Ambulatory Visit: Payer: Self-pay

## 2019-04-24 ENCOUNTER — Encounter: Payer: Self-pay | Admitting: Family Medicine

## 2019-04-24 ENCOUNTER — Ambulatory Visit: Payer: Self-pay

## 2019-04-24 ENCOUNTER — Ambulatory Visit: Payer: Medicare Other | Admitting: Family Medicine

## 2019-04-24 DIAGNOSIS — M25511 Pain in right shoulder: Secondary | ICD-10-CM | POA: Diagnosis not present

## 2019-04-24 DIAGNOSIS — M25512 Pain in left shoulder: Secondary | ICD-10-CM | POA: Diagnosis not present

## 2019-04-24 MED ORDER — TRAMADOL HCL 50 MG PO TABS: 50.0000 mg | ORAL_TABLET | Freq: Four times a day (QID) | ORAL | 0 refills | Status: DC | PRN

## 2019-04-24 MED ORDER — BACLOFEN 10 MG PO TABS: 5.0000 mg | ORAL_TABLET | Freq: Three times a day (TID) | ORAL | 3 refills | Status: DC | PRN

## 2019-04-24 NOTE — Progress Notes (Signed)
I saw and examined the patient with Dr. Mayer Masker and agree with assessment and plan as outlined.    Neck and left shoulder myofascial pain with underlying cervical spondylosis.    Trigger points injected today.  Meds given.  PT referral in case not improving.

## 2019-04-24 NOTE — Progress Notes (Signed)
Katie [name] - 79 y.o. female MRN [id number]  Date of birth: 03-03-1941  Office Visit Note: Visit Date: 04/24/2019 PCP: [name], MD Referred by: [name], MD  Subjective: Chief Complaint  Patient presents with  . Left Shoulder - Pain    Pain started yesterday - "I slept on it wrong." Shoulder and arm hurt all the time. No numbness/tingling in arms/hands.  . Right Arm - Pain   HPI: Katie [name] is a 79 y.o. female who comes in today with acute left sided neck and shoulder pain, right forearm pain starting yesterday.  She reports that she awoke yesterday morning with pain in left neck and shoulder. Pain is intolerable. Hurts to move left shoulder at all. She has tried gabapentin (uses for peripheral neuropathy), ibuprofen with no relief. She has been lying on a heating pad since pain started. No numbness or tingling in arm or hand.   She also has right forearm pain- hurts to turn hand over and move wrist. No numbness or tingling in hands.     ROS Otherwise per HPI.  Assessment & Plan: Visit Diagnoses:  1. 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 Given significant amount of pain today, she elected to have trigger point injections in trapezius and rhomboid. Will send in baclofen and tramadol for pain control. Return as needed. If no improvement, she could possibly benefit from physical therapy or epidural steroid injection/facet joint injection.    Meds & Orders:  Meds ordered this encounter  Medications  . baclofen (LIORESAL) 10 MG tablet    Sig: Take 0.5-1 tablets (5-10 mg total) by mouth 3 (three) times daily as needed for muscle spasms.    Dispense:  30 each    Refill:  3  . traMADol (ULTRAM) 50 MG tablet    Sig: Take 1 tablet (50 mg total) by mouth every 6 (six)  hours as needed.    Dispense:  30 tablet    Refill:  0    Orders Placed This Encounter  Procedures  . XR Cervical Spine 2 or 3 views    Follow-up: PRN  Procedures: No procedures performed  No notes on file   Clinical History: No specialty comments available.   She reports that she quit smoking about 16 years ago. Her smoking use included cigarettes. She has a 108.00 pack-year smoking history. She has never used smokeless tobacco.  Recent Labs    07/04/18 1302 11/30/18 1555 02/27/19 0841  HGBA1C  --   --  5.7*  LABURIC 5.3 5.6 6.3    Objective:  VS:  HT:    WT:   BMI:     BP:   HR: bpm  TEMP: ( )  RESP:  Physical Exam  Neck/Back: - Inspection: no gross deformity or asymmetry, swelling or ecchymosis - Palpation: trapezius muscle with spasm throughout, trigger point at mid trapezius, lateral trapezius, left cervical trapesiuz, and rhomboid with pressure point at mid scapula level - ROM: full active ROM of the cervical spine with neck extension, rotation, flexion - pain with left rotation and neck extension - Strength: strength testing on left limited by pain  - Neuro: sensation intact in the C5-C8 nerve root distribution b/l - Special testing:  spurling's worsens pain   Imaging: Cervical spine with degenerative changes,  narrowing, spurring at all levels  Past Medical/Family/Surgical/Social History: Medications & Allergies reviewed per EMR, new medications updated. Patient Active Problem List   Diagnosis Date Noted  . Mononeuropathy 11/23/2018  . 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 Coronary artery disease due to lipid rich plaque 06/14/2018  . COPD (chronic obstructive pulmonary disease) with chronic bronchitis ([place]) 02/02/2018  . Aortic atherosclerosis ([place]) 10/17/2017  . Depression 09/21/2017  . Bradycardia 09/21/2017  . PAH (pulmonary artery hypertension) ([place]) 06/23/2017  . Esophageal dysphagia 05/15/2017  . Gout  03/20/2017  . Abnormal glucose 09/19/2015  . AAA (abdominal aortic aneurysm) without rupture ([place]) 04/22/2015  . Morbid obesity ([place]) 08/03/2014  . PVD (peripheral vascular disease) with claudication ([place]) 10/16/2013  . CKD (chronic kidney disease) stage 3, GFR 30-59 ml/min 06/08/2013  . Hyperlipidemia, mixed 06/08/2013  . 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 Long term current use of anticoagulant therapy 04/23/2013  . Vitamin D deficiency 02/15/2013  . Hypothyroidism   . Osteopenia   . Chronic combined systolic and diastolic CHF (congestive heart failure) ([place]) 11/25/2008  . Migraine headache 11/22/2008  . Essential hypertension 11/22/2008  . Atherosclerosis of native coronary artery of native heart without angina pectoris 11/22/2008  . Atrial fibrillation ([place]) 11/22/2008  . Chronic obstructive pulmonary disease with hypoxia ([place]) 11/22/2008  . Gastroesophageal reflux disease without esophagitis 11/22/2008   Past Medical History:  Diagnosis Date  . AAA (abdominal aortic aneurysm) ([place])   . AICD (automatic cardioverter/defibrillator) present 11/10/2017  . Arthritis    "some in my knees" (11/10/2017)  . Atrial fibrillation ([place])   . CHF (congestive heart failure) ([place])   . Chronic bronchitis ([place])   . COPD (chronic obstructive pulmonary disease) ([place])    'they say I don't; have a problem breathing though" (11/10/2017)  . Depression   . GERD (gastroesophageal reflux disease)   . Gout    "daily RX" (11/10/2017)  . History of hiatal hernia   . Hyperlipidemia   . Hypertension   . Hypothyroid   . Migraine headache    "hx; none since 1980s" (11/10/2017)  . Osteopenia   . Pneumonia    "~ 3 times" (11/10/2017)  . PVD (peripheral vascular disease) ([facility])    Family History  Problem Relation Age of Onset  . Cirrhosis Mother   . Cancer Mother 63       PANCREAS  . Heart defect Sister   . Breast cancer Sister        age 31  . Heart disease Sister   . Stroke  Sister   . Alcohol abuse Father   . Depression Father   . Hypertension Brother   . Hyperlipidemia Son   . Heart disease Daughter    Past Surgical History:  Procedure Laterality Date  . ABDOMINAL AORTIC ENDOVASCULAR STENT GRAFT N/A 04/11/2013   Procedure: ABDOMINAL AORTIC ENDOVASCULAR STENT GRAFT WITH RIGHT FEMORAL PATCH ANGIOPLASTY;  Surgeon: Mal Misty, MD;  Location: [facility];  Service: Vascular;  Laterality: N/A;  . AV NODE ABLATION N/A 09/13/2018   Procedure: AV NODE ABLATION;  Surgeon: Evans Lance, MD;  Location: [facility];  Service: Cardiovascular;  Laterality: N/A;  . BIV ICD INSERTION CRT-D  11/10/2017  . BIV ICD INSERTION CRT-D N/A 11/10/2017   Procedure: BIV ICD INSERTION CRT-D;  Surgeon: Evans Lance, MD;  Location: [facility];  Service: Cardiovascular;  Laterality: N/A;  . BREAST SURGERY     LEFT BREAST BIOPSY  .  CARDIAC CATHETERIZATION    . CATARACT EXTRACTION W/ INTRAOCULAR LENS  IMPLANT, BILATERAL Bilateral   . CHOLECYSTECTOMY OPEN  1972  . COLONOSCOPY    . EYE SURGERY Bilateral    "bleeding in my eyes"  . FRACTURE SURGERY    . TIBIA FRACTURE SURGERY Left 1970s  . TONSILLECTOMY    . VARICOSE VEIN SURGERY Bilateral    "laser"   Social History   Occupational History  . Not on file  Tobacco Use  . Smoking status: Former Smoker    Packs/day: 2.00    Years: 54.00    Pack years: 108.00    Types: Cigarettes    Quit date: 08/10/2002    Years since quitting: 16.7  . Smokeless tobacco: Never Used  Substance and Sexual Activity  . Alcohol use: Never  . Drug use: Never  . Sexual activity: Not Currently    Birth control/protection: Post-menopausal

## 2019-04-30 ENCOUNTER — Other Ambulatory Visit: Payer: Self-pay | Admitting: Internal Medicine

## 2019-05-09 ENCOUNTER — Ambulatory Visit: Payer: Medicare Other | Admitting: Physical Therapy

## 2019-05-14 ENCOUNTER — Ambulatory Visit (INDEPENDENT_AMBULATORY_CARE_PROVIDER_SITE_OTHER): Payer: Medicare Other | Admitting: *Deleted

## 2019-05-14 DIAGNOSIS — Z95810 Presence of automatic (implantable) cardiac defibrillator: Secondary | ICD-10-CM

## 2019-05-14 LAB — CUP PACEART REMOTE DEVICE CHECK
Battery Remaining Longevity: 96 mo
Battery Remaining Percentage: 100 %
Brady Statistic RA Percent Paced: 0 %
Brady Statistic RV Percent Paced: 98 %
Date Time Interrogation Session: 20210301041100
HighPow Impedance: 72 Ohm
Implantable Lead Implant Date: 20190829
Implantable Lead Implant Date: 20190829
Implantable Lead Implant Date: 20190829
Implantable Lead Location: 753858
Implantable Lead Location: 753859
Implantable Lead Location: 753860
Implantable Lead Model: 292
Implantable Lead Model: 4674
Implantable Lead Model: 7740
Implantable Lead Serial Number: 446776
Implantable Lead Serial Number: 696999
Implantable Lead Serial Number: 827497
Implantable Pulse Generator Implant Date: 20190829
Lead Channel Impedance Value: 1000 Ohm
Lead Channel Impedance Value: 382 Ohm
Lead Channel Impedance Value: 619 Ohm
Lead Channel Setting Pacing Amplitude: 2 V
Lead Channel Setting Pacing Amplitude: 2.5 V
Lead Channel Setting Pacing Pulse Width: 0.4 ms
Lead Channel Setting Pacing Pulse Width: 0.4 ms
Lead Channel Setting Sensing Sensitivity: 0.4 mV
Lead Channel Setting Sensing Sensitivity: 1 mV
Pulse Gen Serial Number: 215092

## 2019-05-15 ENCOUNTER — Other Ambulatory Visit: Payer: Self-pay | Admitting: Vascular Surgery

## 2019-05-15 ENCOUNTER — Other Ambulatory Visit: Payer: Self-pay | Admitting: *Deleted

## 2019-05-15 DIAGNOSIS — J449 Chronic obstructive pulmonary disease, unspecified: Secondary | ICD-10-CM | POA: Diagnosis not present

## 2019-05-15 DIAGNOSIS — I714 Abdominal aortic aneurysm, without rupture, unspecified: Secondary | ICD-10-CM

## 2019-05-15 DIAGNOSIS — Z95828 Presence of other vascular implants and grafts: Secondary | ICD-10-CM

## 2019-05-15 NOTE — Progress Notes (Signed)
ICD Remote  

## 2019-05-16 ENCOUNTER — Other Ambulatory Visit: Payer: Self-pay

## 2019-05-16 ENCOUNTER — Ambulatory Visit (HOSPITAL_COMMUNITY)
Admission: RE | Admit: 2019-05-16 | Discharge: 2019-05-16 | Disposition: A | Discharge status: Home / Self Care | Payer: Medicare Other | Source: Ambulatory Visit | Attending: Vascular Surgery | Admitting: Vascular Surgery

## 2019-05-16 ENCOUNTER — Ambulatory Visit: Payer: Medicare Other | Admitting: Physician Assistant

## 2019-05-16 ENCOUNTER — Encounter: Payer: Self-pay | Admitting: Internal Medicine

## 2019-05-16 ENCOUNTER — Other Ambulatory Visit: Payer: Self-pay | Admitting: Internal Medicine

## 2019-05-16 VITALS — BP 155/75 | HR 70 | Temp 97.2°F | Resp 14 | Ht 63.5 in | Wt 188.0 lb

## 2019-05-16 DIAGNOSIS — I714 Abdominal aortic aneurysm, without rupture, unspecified: Secondary | ICD-10-CM

## 2019-05-16 DIAGNOSIS — Z95828 Presence of other vascular implants and grafts: Secondary | ICD-10-CM | POA: Insufficient documentation

## 2019-05-16 NOTE — Progress Notes (Signed)
[facility] HISTORY AND PHYSICAL   History of Present Illness:  Patient is a 79 y.o. year old female who presents for follow-up of endovascular repair of abdominal aortic aneurysm.  The aneurysm at time of repair measured 5.2cm in diameter by US/CT performed in 2015.  The patient denies abdominal pain.  The patient denies back pain.  The patient hadoes not family history of AAA.  Other medical problems include bilateral lower extremity neuropathy, atrial fibrillation, status post implantation AICD AV node ablation.  She is followed routinely by cardiology who she says monitors carotid artery duplex.  She is maintained on Xarelto..  Former cigarette smoker quit in 2004. She continues aspirin 81 mg daily and pravastatin.  Past Medical History:  Diagnosis Date  . AAA (abdominal aortic aneurysm) ([place])   . AICD (automatic cardioverter/defibrillator) present 11/10/2017  . Arthritis    "some in my knees" (11/10/2017)  . Atrial fibrillation ([place])   . CHF (congestive heart failure) ([place])   . Chronic bronchitis ([place])   . COPD (chronic obstructive pulmonary disease) ([place])    'they say I don't; have a problem breathing though" (11/10/2017)  . Depression   . GERD (gastroesophageal reflux disease)   . Gout    "daily RX" (11/10/2017)  . History of hiatal hernia   . Hyperlipidemia   . Hypertension   . Hypothyroid   . Migraine headache    "hx; none since 1980s" (11/10/2017)  . Osteopenia   . Pneumonia    "~ 3 times" (11/10/2017)  . PVD (peripheral vascular disease) ([place])     Past Surgical History:  Procedure Laterality Date  . ABDOMINAL AORTIC ENDOVASCULAR STENT GRAFT N/A 04/11/2013   Procedure: ABDOMINAL AORTIC ENDOVASCULAR STENT GRAFT WITH RIGHT FEMORAL PATCH ANGIOPLASTY;  Surgeon: Mal Misty, MD;  Location: [facility];  Service: Vascular;  Laterality: N/A;  . AV NODE ABLATION N/A 09/13/2018   Procedure: AV NODE ABLATION;  Surgeon: Evans Lance, MD;  Location: [facility];  Service: Cardiovascular;  Laterality: N/A;  . BIV ICD INSERTION CRT-D  11/10/2017  . BIV ICD INSERTION CRT-D N/A 11/10/2017   Procedure: BIV ICD INSERTION CRT-D;  Surgeon: Evans Lance, MD;  Location: [facility];  Service: Cardiovascular;  Laterality: N/A;  . BREAST SURGERY     LEFT BREAST BIOPSY  . CARDIAC CATHETERIZATION    . CATARACT EXTRACTION W/ INTRAOCULAR LENS  IMPLANT, BILATERAL Bilateral   . CHOLECYSTECTOMY OPEN  1972  . COLONOSCOPY    . EYE SURGERY Bilateral    "bleeding in my eyes"  . FRACTURE SURGERY    . TIBIA FRACTURE SURGERY Left 1970s  . TONSILLECTOMY    . VARICOSE VEIN SURGERY Bilateral    "laser"     Social History Social History   Tobacco Use  . Smoking status: Former Smoker    Packs/day: 2.00    Years: 54.00    Pack years: 108.00    Types: Cigarettes    Quit date: 08/10/2002    Years since quitting: 16.7  . Smokeless tobacco: Never Used  Substance Use Topics  . Alcohol use: Never  . Drug use: Never    Family History Family History  Problem Relation Age of Onset  . Cirrhosis Mother   . Cancer Mother 4       PANCREAS  . Heart defect Sister   . Breast cancer Sister        age 65  . Heart disease Sister   .  Stroke Sister   . Alcohol abuse Father   . Depression Father   . Hypertension Brother   . Hyperlipidemia Son   . Heart disease Daughter     Allergies  Allergies  Allergen Reactions  . Amiodarone Other (See Comments)    PULMONARY TOXICITY  . Diovan [Valsartan] Other (See Comments)    HYPOTENSION  . Doxycycline Diarrhea and Other (See Comments)    VISUAL DISTURBANCE  . Flexeril [Cyclobenzaprine] Other (See Comments)    FATIGUE  . Keflex [Cephalexin] Diarrhea  . Verapamil Other (See Comments)    EDEMA  . Codeine Hives     Current Outpatient Medications  Medication Sig Dispense Refill  . acetaminophen (TYLENOL) 500 MG tablet Take 1,000 mg by mouth every 6 (six) hours as needed for moderate pain.      Marland Kitchen albuterol (PROAIR HFA) 108 (90 Base) MCG/ACT inhaler Use 2 Inhalations 15 minutes Apart every 4 hours to Rescue Asthma Attack (Patient taking differently: Inhale 2 puffs into the lungs every 4 (four) hours as needed for wheezing or shortness of breath. ) 42.5 Inhaler 3  . allopurinol (ZYLOPRIM) 300 MG tablet TAKE 1 TABLET BY MOUTH EVERY DAY 90 tablet 1  . amitriptyline (ELAVIL) 25 MG tablet Take 1 tablet 4 x /day for Neuropathy Pains 360 tablet 1  . aspirin EC 81 MG tablet Take 81 mg by mouth daily.    . baclofen (LIORESAL) 10 MG tablet Take 0.5-1 tablets (5-10 mg total) by mouth 3 (three) times daily as needed for muscle spasms. 30 each 3  . cetirizine (ZYRTEC) 10 MG tablet Take 10 mg by mouth daily as needed for allergies.     . Cholecalciferol (VITAMIN D3) 5000 units CAPS Take 5,000 Units by mouth daily.     . digoxin (LANOXIN) 0.125 MG tablet Take 1 tablet (0.125 mg total) by mouth daily. 90 tablet 1  . Eyelid Cleansers (AVENOVA) 0.01 % SOLN Apply 1 application topically See admin instructions. Wash eyelids twice daily with cleaner  99  . gabapentin (NEURONTIN) 600 MG tablet TAKE 1 TABLET BY MOUTH THREE TIMES A DAY FOR PAIN 360 tablet 1  . guaiFENesin (MUCINEX) 600 MG 12 hr tablet Take 600 mg by mouth daily as needed for cough or to loosen phlegm.     Marland Kitchen ipratropium (ATROVENT) 0.06 % nasal spray Use 1 to 2 sprays each Nostril 2 to 3 x / day as needed 45 mL 3  . ipratropium-albuterol (DUONEB) 0.5-2.5 (3) MG/3ML SOLN Inhale 3 mLs into the lungs every 6 (six) hours as needed (for shortness of breath or wheezing). 150 mL 2  . ketoconazole (NIZORAL) 2 % cream Apply to Skin Fungus / Ringworm 2 x /day 60 g 3  . leflunomide (ARAVA) 10 MG tablet Take 10 mg by mouth daily.     Marland Kitchen levothyroxine (SYNTHROID) 100 MCG tablet Take 1 tablet daily on an empty stomach with only water for 30 minutes & no Antacid meds, Calcium or Magnesium for 4 hours & avoid Biotin 90 tablet 3  . montelukast (SINGULAIR) 10 MG  tablet TAKE 1 TABLET DAILY FOR ALLERGIES & ASTHMA 90 tablet 3  . Olopatadine HCl 0.2 % SOLN     . pantoprazole (PROTONIX) 40 MG tablet Take 1 tablet (40 mg total) by mouth daily.    [name] Glycol-Propyl Glycol (SYSTANE) 0.4-0.3 % SOLN Place 1 drop into both eyes 4 (four) times daily as needed (for dry eyes).     . potassium chloride SA (KLOR-CON  M20) 20 MEQ tablet Take 2 tablets Daily for Potassium 180 tablet 3  . pravastatin (PRAVACHOL) 40 MG tablet TAKE 1/2 TABLET BY MOUTH AT BEDTIME FOR CHOLESTEROL (Patient taking differently: Take 20 mg by mouth at bedtime. ) 45 tablet 3  . prednisoLONE acetate (PRED FORTE) 1 % ophthalmic suspension Place 1 drop into both eyes 2 (two) times daily.   1  . prochlorperazine (COMPAZINE) 5 MG tablet TAKE 1 TABLET BY MOUTH THREE TIMES A DAY AS NEEDED FOR VERTIGO OR NAUSEA 60 tablet 1  . Rivaroxaban (XARELTO) 15 MG TABS tablet Take 1 tablet Daily for A fib (Patient taking differently: Take 15 mg by mouth daily with supper. ) 90 tablet 3  . torsemide (DEMADEX) 20 MG tablet Take 2 tablets (40 mg total) by mouth 2 (two) times daily. 360 tablet 2  . traMADol (ULTRAM) 50 MG tablet Take 1 tablet (50 mg total) by mouth every 6 (six) hours as needed. 30 tablet 0  . triamcinolone cream (KENALOG) 0.1 % Apply 1 application topically daily as needed (for dry skin).     Marland Kitchen umeclidinium-vilanterol (ANORO ELLIPTA) 62.5-25 MCG/INH AEPB Inhale 1 puff into the lungs daily.     No current facility-administered medications for this visit.    ROS:   General:  No weight loss, Fever, chills  HEENT: No recent headaches, no nasal bleeding, no visual changes, no sore throat  Neurologic: No dizziness, blackouts, seizures. No recent symptoms of stroke or mini- stroke. No recent episodes of slurred speech, or temporary blindness.  Cardiac: No recent episodes of chest pain/pressure, no shortness of breath at rest.  No shortness of breath with exertion.    Vascular: She currently has a  small skin fissure of the right heel.  No history of rest pain in feet.  No history of claudication.  No history of non-healing ulcer, No history of DVT   Pulmonary: No home oxygen, no productive cough, no hemoptysis,  No asthma or wheezing  Musculoskeletal:  [ x] Arthritis, [ ]  Low back pain,  [x ] Joint pain  Hematologic: She states she recently had an episode of vaginal bleeding.  She was evaluated by gynecology.  No history of hypercoagulable state.  No history of easy bleeding.  No history of anemia  Gastrointestinal: No hematochezia or melena,  No gastroesophageal reflux, no trouble swallowing  Urinary: [ ]  chronic Kidney disease, [ ]  on HD - [ ]  MWF or [ ]  TTHS, [ ]  Burning with urination, [ ]  Frequent urination, [ ]  Difficulty urinating;   Skin: No rashes  Psychological: No history of anxiety,  No history of depression   Physical Examination  Vitals:   05/16/19 0826  Weight: 188 lb (85.3 kg)  Height: 5' 3.5" (1.613 m)   There is no height or weight on file to calculate BMI.  General:  Alert and oriented, no acute distress HEENT: Normal Neck: No bruit or JVD Pulmonary: Clear to auscultation bilaterally Cardiac: Regular Rate and Rhythm without murmur Gastrointestinal: Soft, non-tender, non-distended, no mass, no scars Skin: No rash.  Bilateral groin incisions well-healed Extremity Pulses:  2+ radial, femoral, dorsalis pedis. ,  Triphasic dopplerable posterior tibial pulses, dorsalis pedis and peroneal pulses bilaterally.  Musculoskeletal: No deformity or edema.  Approximately 1-1/2 cm skin fissure of the right heel.  There is no surrounding erythema  Neurologic: Upper and lower extremity motor 5/5 and symmetric  DATA: Abdominal Aorta: Patent endovascular aneurysm repair with no evidence of  endoleak. The largest aortic  diameter has decreased compared to prior  exam. Previous diameter measurement was 3.5 x 3.7cm obtained on 05/12/18.     ASSESSMENT: The patient is  status post EVAR with bilateral groin cutdown as well as right common femoral endarterectomy with Dacron patch angioplasty.  This was performed in January 2015.  Her aneurysm originally measured 5.2 cm in maximal AP diameter.  Currently it measures 3.32 cm on ultrasound.  1 year ago it measured 3.5 cm.  She has some issues with neuropathy in her feet particularly with cold sensation to her feet.  Normal triphasic ABIs in 2019   PLAN: I discussed foot care and calling us for any nonhealing sores.  Decreased size of aneurysm status post EVAR 2015.  Currently without complaints of claudication, abdominal pain or back pain.  We will follow-up in 1 year with abdominal aorta ultrasound and ABIs.  Barbie Banner, PA-C [facility] Office: [phone]

## 2019-05-17 ENCOUNTER — Other Ambulatory Visit: Payer: Self-pay

## 2019-05-17 ENCOUNTER — Ambulatory Visit
Admission: RE | Admit: 2019-05-17 | Discharge: 2019-05-17 | Disposition: A | Discharge status: Home / Self Care | Payer: Medicare Other | Source: Ambulatory Visit | Attending: Adult Health | Admitting: Adult Health

## 2019-05-17 ENCOUNTER — Ambulatory Visit (INDEPENDENT_AMBULATORY_CARE_PROVIDER_SITE_OTHER): Payer: Medicare Other | Admitting: Adult Health

## 2019-05-17 ENCOUNTER — Encounter: Payer: Self-pay | Admitting: Adult Health

## 2019-05-17 VITALS — BP 146/80 | HR 83 | Temp 97.5°F | Ht 63.5 in | Wt 192.0 lb

## 2019-05-17 DIAGNOSIS — R05 Cough: Secondary | ICD-10-CM

## 2019-05-17 DIAGNOSIS — J441 Chronic obstructive pulmonary disease with (acute) exacerbation: Secondary | ICD-10-CM | POA: Diagnosis not present

## 2019-05-17 DIAGNOSIS — R058 Other specified cough: Secondary | ICD-10-CM

## 2019-05-17 MED ORDER — PREDNISONE 20 MG PO TABS: ORAL_TABLET | ORAL | 0 refills | Status: DC

## 2019-05-17 MED ORDER — AZITHROMYCIN 250 MG PO TABS: ORAL_TABLET | ORAL | 1 refills | Status: AC

## 2019-05-17 NOTE — Patient Instructions (Addendum)
Bevespri is twice a day inhaler.   Rinse mouth out afterwards - has steroid    Glycopyrrolate; Formoterol inhalation aerosol What is this medicine? GLYCOPYRROLATE; FORMOTEROL (glye koe PYE roe late; for [facility] te rol) inhalation is a combination of 2 medicines that help to open up the airways of your lungs. This medicine is used to treat chronic obstructive pulmonary disease (COPD). Do NOT use for an acute COPD attack. This medicine may be used for other purposes; ask your health care provider or pharmacist if you have questions. COMMON BRAND NAME(S): Bevespi What should I tell my health care provider before I take this medicine? They need to know if you have any of these conditions:  bladder problems or difficulty passing urine  diabetes  glaucoma  heart disease  high blood pressure  history of an irregular heartbeat  kidney disease  liver disease  prostate trouble  seizures  thyroid disease  an unusual or allergic reaction to glycopyrrolate, formoterol, other medicines, foods, dyes, or preservatives  pregnant or trying to get pregnant  breast-feeding How should I use this medicine? This medicine is inhaled through the mouth. Follow the directions on the prescription label. Shake well before each use. Take your medicine at regular intervals. Do not take your medicine more often than directed. Do not stop taking except on your doctor's advice. Make sure that you are using your inhaler correctly. Ask you doctor or health care provider if you have any questions. Talk to your pediatrician regarding the use of this medicine in children. This medicine is not approved for use in children. Overdosage: If you think you have taken too much of this medicine contact a poison control center or emergency room at once. NOTE: This medicine is only for you. Do not share this medicine with others. What if I miss a dose? If you miss a dose, use it as soon as you can. If it is almost  time for your next dose, use only that dose. Do not use double or extra doses. What may interact with this medicine? Do not take the medicine with any of the following medications:  cisapride  dofetilide  dronedarone  other medicines that contain long-acting beta-2 agonists (LABAs) like formoterol, indacaterol, olodaterol, salmeterol, vilanterol  pimozide  thioridazine  ziprasidone This medicine may also interact with the following medications:  antihistamines for allergy, cough, and cold  certain medicines for bladder problems like oxybutyin and tolterodine  certain medicines for blood pressure, heart disease, irregular heart beat  certain medicines for depression, anxiety, or psychotic disturbances  MAOIs like Carbex, Eldepryl, Marplan, Nardil, and Parnate  other medicines that contain an anticholinergic like aclidinium, ipratropium, glycopyrrolate, tiotropium, umeclidinium  other medicines that prolong the QT interval (cause an abnormal heart rhythm)  steroid medicines like prednisone or cortisone  stimulant medicines for attention disorders, weight loss, or to stay awake  theophylline This list may not describe all possible interactions. Give your health care provider a list of all the medicines, herbs, non-prescription drugs, or dietary supplements you use. Also tell them if you smoke, drink alcohol, or use illegal drugs. Some items may interact with your medicine. What should I watch for while using this medicine? Visit your doctor for regular check ups. If your symptoms get worse or if you need your short-acting inhalers more often, call your doctor right away. Do not take more medicine than directed. Do not get the this medicine in your eyes. It can cause irritation, pain, or  blurred vision. You may get dizzy. Do not drive, use machinery, or do anything that needs mental alertness until you know how this medicine affects you. Do not stand or sit up quickly,  especially if you are an older patient. This reduces the risk of dizzy or fainting spells. Clean the inhaler as directed in the patient information sheet that comes with this medicine. What side effects may I notice from receiving this medicine? Side effects that you should report to your doctor or health care professional as soon as possible:  allergic reactions like skin rash, itching or hives, swelling of the face, lips, or tongue  breathing problems  changes in vision, eye pain  muscle cramps or muscle pain  nervousness  pain or difficulty passing urine or change in the amount of urine  signs and symptoms of a dangerous change in heartbeat or heart rhythm like chest pain; dizziness; fast or irregular heart beat; palpitations; feeling faint or lightheaded, falls; breathing problems  signs and symptoms of high blood sugar such as dizziness; dry mouth; dry skin; fruity breath; nausea; stomach pain; increased hunger or thirst; increased urination  tremor Side effects that usually do not require medical attention (report to your doctor or health care professional if they continue or are bothersome):  cough  runny nose  sore throat This list may not describe all possible side effects. Call your doctor for medical advice about side effects. You may report side effects to FDA at 1-800-FDA-1088. Where should I keep my medicine? Keep out of the reach of children. Store in a dry place at room temperature between 20 and 25 degrees C (68 and 77 degrees F). The contents are under pressure and may burst when exposed to heat or flame. Do not get the canister of the inhaler wet. Do not freeze. Inhalers need to be thrown away after the labeled number of puffs have been used or by the expiration date, whichever comes first. This inhaler should be thrown away 3 months after removing from foil pouch. NOTE: This sheet is a summary. It may not cover all possible information. If you have questions about  this medicine, talk to your doctor, pharmacist, or health care provider.  2020 Elsevier/Gold Standard (2017-08-15 11:55:23)

## 2019-05-17 NOTE — Progress Notes (Signed)
Assessment and Plan:  Diagnoses and all orders for this visit:  Chronic obstructive pulmonary disease with acute exacerbation ([place]) Continue close monitoring O2, notify office if trending down further Appears euvolemic, no signs of fluid overload Suggestive of mild COPD flare; will check CXR due to O2 sats trending down to r/o pneumonia though will proceed with zpak and steroid due to risk factors She reports benzonatate working well Continue bevespri - advised BID medication, not daily -rinse mouth due to steroid The patient was advised to call immediately if she has any concerning symptoms in the interval. The patient voices understanding of current treatment options and is in agreement with the current care plan. All questions were answered. The patient knows to call the clinic with any problems, questions or concerns or go to the ER if any sudden progression of symptoms.   -     azithromycin (ZITHROMAX) 250 MG tablet; Take 2 tablets (500 mg) on  Day 1,  followed by 1 tablet (250 mg) once daily on Days 2 through 5. -     predniSONE (DELTASONE) 20 MG tablet; 2 tablets daily for 3 days, 1 tablet daily for 4 days. -     DG Chest 2 View; Future   Further disposition pending results of labs. Discussed med's effects and SE's.   Over 15 minutes of exam, counseling, chart review, and critical decision making was performed.   Future Appointments  Date Time Provider [place]  05/18/2019  9:45 AM Evans Lance, MD [facility]  05/30/2019 11:30 AM Liane Comber, NP [facility] None  06/25/2019  9:00 AM Deloria Lair, NP [facility] None  08/15/2019  1:10 PM [facility] DEVICE REMOTES [facility]  09/05/2019 10:30 AM [name], MD [facility] None  11/12/2019  8:35 AM [facility] DEVICE REMOTES [facility]  02/11/2020  8:35 AM [facility] DEVICE REMOTES [facility]  03/17/2020  9:00 AM [name], MD [facility] None     ------------------------------------------------------------------------------------------------------------------   HPI BP (!) 146/80   Pulse 83   Temp (!) 97.5 F (36.4 C)   Ht 5' 3.5" (1.613 m)   Wt 192 lb (87.1 kg)   SpO2 93%   BMI 33.48 kg/m   79 y.o.female with hx of ASHD/cAfib, chHF with an BiVent AICD, CKD3, COPD consequent of her long smoking hx and 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  Presents for evaluation reporting concerns with O2 saturations dropping at home; she reports baseline was above 93% but in the last several days has noted creeping down to 91%, rarely down to 88%. Despite this she states feels fairly well, believes she has bronchitis and allergy flare. Speaks in complete sentences, overall appears well. She wears 2L O2 at home and PRN 2-3L with exertion.   She reports 4-5 days ago she was working in her yard, started coughing that evening, reports has AM productive cough, congested nose and sinuses, some cough during the day but improves. She denies fever/chills. She endorses running nose, post-nasal drip, sneezing. She reports sense of chest congestion, mild wheezing at night, denies dyspnea, CP. She reports mild sore throat. Mild sinus pressure headache. Denies fever/chills, dizziness, rash, GI sx, myalgias, arthralgias. Denies changes in sense of taste or smell.   She added mucinex for secretions, salt water gargles for mild sore throat Has tessalon perles leftover from previous which is working well for cough  She is on zyrtec, nasal sprays for allergies which does work fairly well  Has been doing rescue/neb twice a day  She has anoro but ran out, has been taking bevespi via samples and states this seems to work better, has a full 6 week supply  She has had both covid 19 vaccines, had second shot 2 weeks ago. No known sick exposures, she has been aggressively socially distancing due to risk factors.   Denies abx in last 3 months.    She has a history of Combined Systolic and Diastolic CHF Denies dyspnea on exertion, orthopnea and paroxysmal nocturnal dyspnea. Positive for none, mild exertional dyspnea but has chronically and consistent with baseline. She reports stable on home scale at 185 lb, weighing daily. Takes torsemide 40 mg BID. She reports very mild anke edema consistent with baseline.  Wt Readings from Last 3 Encounters:  05/17/19 192 lb (87.1 kg)  05/16/19 188 lb (85.3 kg)  03/01/19 193 lb (87.5 kg)     Past Medical History:  Diagnosis Date  . AAA (abdominal aortic aneurysm) ([place])   . AICD (automatic cardioverter/defibrillator) present 11/10/2017  . Arthritis    "some in my knees" (11/10/2017)  . Atrial fibrillation ([place])   . CHF (congestive heart failure) ([place])   . Chronic bronchitis ([place])   . COPD (chronic obstructive pulmonary disease) ([place])   . Depression   . GERD (gastroesophageal reflux disease)   . Gout    "daily RX" (11/10/2017)  . History of hiatal hernia   . Hyperlipidemia   . Hypertension   . Hypothyroid   . Migraine headache    "hx; none since 1980s" (11/10/2017)  . Osteopenia   . Pneumonia    "~ 3 times" (11/10/2017)  . PVD (peripheral vascular disease) ([facility])      Allergies  Allergen Reactions  . Amiodarone Other (See Comments)    PULMONARY TOXICITY  . Diovan [Valsartan] Other (See Comments)    HYPOTENSION  . Doxycycline Diarrhea and Other (See Comments)    VISUAL DISTURBANCE  . Flexeril [Cyclobenzaprine] Other (See Comments)    FATIGUE  . Keflex [Cephalexin] Diarrhea  . Verapamil Other (See Comments)    EDEMA  . Codeine Hives    Current Outpatient Medications on File Prior to Visit  Medication Sig  . acetaminophen (TYLENOL) 500 MG tablet Take 1,000 mg by mouth every 6 (six) hours as needed for moderate pain.   Marland Kitchen albuterol (PROAIR HFA) 108 (90 Base) MCG/ACT inhaler Use 2 Inhalations 15 minutes Apart every 4 hours to Rescue Asthma Attack (Patient taking differently:  Inhale 2 puffs into the lungs every 4 (four) hours as needed for wheezing or shortness of breath. )  . allopurinol (ZYLOPRIM) 300 MG tablet TAKE 1 TABLET BY MOUTH EVERY DAY  . amitriptyline (ELAVIL) 25 MG tablet Take 1 tablet 4 x /day for Neuropathy Pains  . aspirin EC 81 MG tablet Take 81 mg by mouth daily.  . baclofen (LIORESAL) 10 MG tablet Take 0.5-1 tablets (5-10 mg total) by mouth 3 (three) times daily as needed for muscle spasms.  . cetirizine (ZYRTEC) 10 MG tablet Take 10 mg by mouth daily as needed for allergies.   . Cholecalciferol (VITAMIN D3) 5000 units CAPS Take 5,000 Units by mouth daily.   . digoxin (LANOXIN) 0.125 MG tablet Take 1 tablet (0.125 mg total) by mouth daily.  . Eyelid Cleansers (AVENOVA) 0.01 % SOLN Apply 1 application topically See admin instructions. Wash eyelids twice daily with cleaner  . gabapentin (NEURONTIN) 600 MG tablet TAKE 1 TABLET BY MOUTH THREE TIMES A DAY FOR PAIN  . guaiFENesin (MUCINEX) 600 MG  12 hr tablet Take 600 mg by mouth daily as needed for cough or to loosen phlegm.   Marland Kitchen ipratropium (ATROVENT) 0.06 % nasal spray Use 1 to 2 sprays each Nostril 2 to 3 x / day as needed  . ipratropium-albuterol (DUONEB) 0.5-2.5 (3) MG/3ML SOLN Inhale 3 mLs into the lungs every 6 (six) hours as needed (for shortness of breath or wheezing).  Marland Kitchen ketoconazole (NIZORAL) 2 % cream Apply to Skin Fungus / Ringworm 2 x /day  . leflunomide (ARAVA) 10 MG tablet Take 10 mg by mouth daily.   Marland Kitchen levothyroxine (SYNTHROID) 100 MCG tablet Take 1 tablet daily on an empty stomach with only water for 30 minutes & no Antacid meds, Calcium or Magnesium for 4 hours & avoid Biotin  . montelukast (SINGULAIR) 10 MG tablet TAKE 1 TABLET DAILY FOR ALLERGIES & ASTHMA  . Olopatadine HCl 0.2 % SOLN   . pantoprazole (PROTONIX) 40 MG tablet Take 1 tablet (40 mg total) by mouth daily.  [name] Glycol-Propyl Glycol (SYSTANE) 0.4-0.3 % SOLN Place 1 drop into both eyes 4 (four) times daily as needed  (for dry eyes).   . potassium chloride SA (KLOR-CON M20) 20 MEQ tablet Take 2 tablets Daily for Potassium  . pravastatin (PRAVACHOL) 40 MG tablet TAKE 1/2 TABLET BY MOUTH AT BEDTIME FOR CHOLESTEROL (Patient taking differently: Take 20 mg by mouth at bedtime. )  . prednisoLONE acetate (PRED FORTE) 1 % ophthalmic suspension Place 1 drop into both eyes 2 (two) times daily.   . prochlorperazine (COMPAZINE) 5 MG tablet TAKE 1 TABLET BY MOUTH THREE TIMES A DAY AS NEEDED FOR VERTIGO OR NAUSEA  . Rivaroxaban (XARELTO) 15 MG TABS tablet Take 1 tablet Daily for A fib (Patient taking differently: Take 15 mg by mouth daily with supper. )  . torsemide (DEMADEX) 20 MG tablet Take 2 tablets (40 mg total) by mouth 2 (two) times daily.  . traMADol (ULTRAM) 50 MG tablet Take 1 tablet (50 mg total) by mouth every 6 (six) hours as needed.  . triamcinolone cream (KENALOG) 0.1 % Apply 1 application topically daily as needed (for dry skin).   Marland Kitchen umeclidinium-vilanterol (ANORO ELLIPTA) 62.5-25 MCG/INH AEPB Inhale 1 puff into the lungs daily.   No current facility-administered medications on file prior to visit.    ROS: all negative except above.   Physical Exam:  BP (!) 146/80   Pulse 83   Temp (!) 97.5 F (36.4 C)   Ht 5' 3.5" (1.613 m)   Wt 192 lb (87.1 kg)   SpO2 93%   BMI 33.48 kg/m   General Appearance: Well nourished, in no apparent distress. Eyes: PERRLA, EOMs, conjunctiva no swelling or erythema Sinuses: No Frontal/maxillary tenderness ENT/Mouth: Ext aud canals clear, TMs without erythema, bulging. mild erythema on post pharynx without swelling, or exudate Tonsils not swollen or erythematous. Hearing normal.  Neck: Supple, thyroid normal.  Respiratory: Respiratory effort normal, no accessory muscle use, BS equal bilaterally without rales, does have scant rhonchi bil, without wheezing or stridor. Occasional hacky cough episodes in office.  Cardio: RRR, 3/6 systolic murmur noted. Thready peripheral  pulses, minimal peripheral edema  Abdomen: Soft, + BS.  Non tender, no guarding, rebound, hernias, masses. Lymphatics: Non tender without lymphadenopathy.  Musculoskeletal: Full ROM, 5/5 strength, normal gait.  Skin: Warm, dry without rashes, lesions, ecchymosis.  Neuro: Cranial nerves intact. Normal muscle tone, no cerebellar symptoms. Sensation intact.  Psych: Awake and oriented X 3, normal affect, Insight and Judgment appropriate.  Izora Ribas, NP 2:59 PM [facility]

## 2019-05-18 ENCOUNTER — Other Ambulatory Visit: Payer: Self-pay | Admitting: *Deleted

## 2019-05-18 ENCOUNTER — Encounter: Payer: Medicare Other | Admitting: Internal Medicine

## 2019-05-18 DIAGNOSIS — I739 Peripheral vascular disease, unspecified: Secondary | ICD-10-CM

## 2019-05-18 DIAGNOSIS — I714 Abdominal aortic aneurysm, without rupture, unspecified: Secondary | ICD-10-CM

## 2019-05-28 NOTE — Progress Notes (Signed)
MEDICARE ANNUAL WELLNESS VISIT AND FOLLOW UP   Assessment:     Encounter for Medicare annual wellness exam 1 year  Permanent atrial fibrillation Continue cardio follow up, continue xarelto  Chronic combined systolic and diastolic CHF (congestive heart failure) ([place]) Continue cardio follow up Disease process and medications discussed. Questions answered fully. Emphasized salt restriction, less than 2000mg  a day. Encouraged daily monitoring of the patient's weight, call office if 5 lb weight loss or gain in a day.  Encouraged regular exercise. If any increasing shortness of breath, swelling, or chest pressure go to ER immediately.  decrease your fluid intake to less than 2 L daily please remember to always increase your potassium intake with any increase of your fluid pill.   PVD (peripheral vascular disease) with claudication (HCC) Control blood pressure, cholesterol, glucose, increase exercise.   AAA (abdominal aortic aneurysm) without rupture (HCC) Control blood pressure, cholesterol, glucose, increase exercise.  Follows with cardiology/vascular  PAH (pulmonary artery hypertension) ([place]) Continue follow up cardiology  Aortic atherosclerosis ([place]) Per CT Control blood pressure, cholesterol, glucose, increase exercise.   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   Essential hypertension -  continue medications, DASH diet, exercise and monitor at home. Call if greater than 130/80.   Atherosclerosis of native coronary artery of native heart without angina pectoris Follows with cardiology  Control blood pressure, cholesterol, glucose, increase exercise.   Pulmonary Fibrosis sequellae of Amiodarone Off of amiodarone; monitor; continue inhalers  Gastroesophageal reflux disease without esophagitis Continue PPI/H2 blocker, diet discussed  Esophageal dysphagia Continue PPI/H2 blocker, diet discussed  Hypothyroidism, unspecified type Hypothyroidism-check TSH level, continue medications the same, reminded to take on an empty stomach 30-10mins before food.   Osteopenia, unspecified location Monitor  Hyperlipidemia, mixed check lipids decrease fatty foods increase activity.   Long term current use of anticoagulant therapy On xarelto  Vitamin D deficiency Continue supplement  Abnormal glucose Discussed disease progression and risks Discussed diet/exercise, weight management and risk modification Continue to cut sugars and do well  Gout, unspecified cause, unspecified chronicity, unspecified site Gout- recheck Uric acid as needed, Diet discussed, continue medications.  Rheumatoid arthritis ([place]) Follows with Dr. Dossie Der; on DMARD; monitor closely  Over 40 minutes of exam, counseling, chart review and critical decision making was performed Future Appointments  Date Time Provider [place]  06/25/2019  9:00 AM Deloria Lair, NP [facility] None  06/26/2019  3:00 PM Evans Lance, MD [facility]  08/15/2019  1:10 PM [facility] DEVICE REMOTES [facility]  09/05/2019 10:30 AM [name], MD [facility] None  11/12/2019  8:35 AM [facility] DEVICE REMOTES [facility]  02/11/2020  8:35 AM [facility] DEVICE REMOTES [facility]  03/17/2020  9:00 AM [name], MD [facility] None     Plan:   During the  course of the visit the patient was educated and counseled about appropriate screening and preventive services including:    Pneumococcal vaccine   Prevnar 13  Influenza vaccine  Td vaccine  Screening electrocardiogram  Bone densitometry screening  Colorectal cancer screening  Diabetes screening  Glaucoma screening  Nutrition counseling   Advanced directives: requested   Subjective:  Liberta Gimpel is a 79 y.o. female who presents for Medicare Annual Wellness Visit.  Most recently she had a + CCP/CRP and is following with rheumatology - Dr. Dossie Der upstairs, reports ? RA, do not see notes have been received, requested. Has been initiated on leflunomide/arava daily, does steroid injections PRN.   BMI is Body mass index is 34.18 kg/m., she is working on diet and exercise.  She does walk, up and down driveway recently, 2-53 min. Plans to start going to the park now that weather is warming up Wt Readings from Last 3 Encounters:  05/30/19 196 lb (88.9 kg)  05/17/19 192 lb (87.1 kg)  05/16/19 188 lb (85.3 kg)   She has a history of AAA s/p repair, a. Fib with chadsvasc of 2 on xarelto, difibrillator implantation in 08/6438, Combined Systolic and Diastolic CHF (ECHO 05/4740 EF 35-40%), followed by Dr. Lovena Le.  She had AV ablation by Dr. Lovena Le on 09/13/2018 due to tachyarrhythmia. She is on demadex 40mg  BID; has taken metolazone AS needed in the past but hasn't needed recently.  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 Melvyn Novas but states doesn't want to return. She has hypoxia and wears 2L O2 at night. She is currently taking breztri 1 puff BID, reports a significant improvement with this compared to anoro. She reports albuterol use approx 2 x/month. Taking mucinex for thick AM secretions as needed.   She reports overall doing very well for cardiac/pulm; "I feel the best I've felt in a long time."    Her blood pressure has been controlled at home, today their BP is BP: 134/72  She has a history of Combined Systolic and Diastolic, denies orthopnea and paroxysmal nocturnal dyspnea. Positive for dyspnea (consistently with baseline  She had AV ablation by Dr. Lovena Le on 09/13/2018 due to tachyarrhythmia.   Wt Readings from Last 3 Encounters:  05/30/19 196 lb (88.9 kg)  05/17/19 192 lb (87.1 kg)  05/16/19 188 lb (85.3 kg)    She is on cholesterol medication (pravastatin 20 mg every other day) and denies myalgias. Her cholesterol is at goal. The cholesterol last visit was:   Lab Results  Component Value Date   CHOL 133 02/27/2019   HDL 25 (L) 02/27/2019   LDLCALC 81 02/27/2019   TRIG 175 (H) 02/27/2019   CHOLHDL 5.3 (H) 02/27/2019     Last A1C in the office was:  Lab Results  Component Value Date   HGBA1C 5.7 (H) 02/27/2019    She has CKD IIIb, follows with [facility]  Lab Results  Component Value Date   GFRNONAA 31 (L) 02/27/2019   GFRNONAA 36 (L) 11/23/2018   GFRNONAA 32 (L) 09/05/2018   Patient is off of vitamin D per nephrology instructions Lab Results  Component Value Date   VD25OH 98 02/27/2019      Medication Review: Current Outpatient Medications on File Prior to Visit  Medication Sig Dispense Refill  . acetaminophen (TYLENOL) 500 MG tablet Take 1,000 mg by mouth every 6 (six) hours as needed for moderate pain.     Marland Kitchen albuterol (PROAIR HFA) 108 (90 Base) MCG/ACT inhaler Use 2 Inhalations 15 minutes Apart every 4 hours to Rescue Asthma Attack (Patient taking differently: Inhale 2 puffs into the lungs every 4 (four) hours as needed for  wheezing or shortness of breath. ) 42.5 Inhaler 3  . allopurinol (ZYLOPRIM) 300 MG tablet TAKE 1 TABLET BY MOUTH EVERY DAY 90 tablet 1  . amitriptyline (ELAVIL) 25 MG tablet Take 1 tablet 4 x /day for Neuropathy Pains 360 tablet 1  . aspirin EC 81 MG tablet Take 81 mg by mouth daily.    . baclofen (LIORESAL) 10 MG  tablet Take 0.5-1 tablets (5-10 mg total) by mouth 3 (three) times daily as needed for muscle spasms. 30 each 3  . Budeson-Glycopyrrol-Formoterol (BREZTRI AEROSPHERE IN) Inhale into the lungs 2 (two) times daily.    . cetirizine (ZYRTEC) 10 MG tablet Take 10 mg by mouth daily as needed for allergies.     Marland Kitchen digoxin (LANOXIN) 0.125 MG tablet Take 1 tablet (0.125 mg total) by mouth daily. 90 tablet 1  . Eyelid Cleansers (AVENOVA) 0.01 % SOLN Apply 1 application topically See admin instructions. Wash eyelids twice daily with cleaner  99  . gabapentin (NEURONTIN) 600 MG tablet TAKE 1 TABLET BY MOUTH THREE TIMES A DAY FOR PAIN 360 tablet 1  . guaiFENesin (MUCINEX) 600 MG 12 hr tablet Take 600 mg by mouth daily as needed for cough or to loosen phlegm.     Marland Kitchen ipratropium (ATROVENT) 0.06 % nasal spray Use 1 to 2 sprays each Nostril 2 to 3 x / day as needed 45 mL 3  . ipratropium-albuterol (DUONEB) 0.5-2.5 (3) MG/3ML SOLN Inhale 3 mLs into the lungs every 6 (six) hours as needed (for shortness of breath or wheezing). 150 mL 2  . ketoconazole (NIZORAL) 2 % cream Apply to Skin Fungus / Ringworm 2 x /day 60 g 3  . leflunomide (ARAVA) 10 MG tablet Take 10 mg by mouth daily.     Marland Kitchen levothyroxine (SYNTHROID) 100 MCG tablet Take 1 tablet daily on an empty stomach with only water for 30 minutes & no Antacid meds, Calcium or Magnesium for 4 hours & avoid Biotin 90 tablet 3  . montelukast (SINGULAIR) 10 MG tablet TAKE 1 TABLET DAILY FOR ALLERGIES & ASTHMA 90 tablet 3  . Olopatadine HCl 0.2 % SOLN     . pantoprazole (PROTONIX) 40 MG tablet Take 1 tablet (40 mg total) by mouth daily.    [name] Glycol-Propyl Glycol (SYSTANE) 0.4-0.3 % SOLN Place 1 drop into both eyes 4 (four) times daily as needed (for dry eyes).     . potassium chloride SA (KLOR-CON M20) 20 MEQ tablet Take 2 tablets Daily for Potassium 180 tablet 3  . pravastatin (PRAVACHOL) 40 MG tablet TAKE 1/2 TABLET BY MOUTH AT BEDTIME FOR CHOLESTEROL (Patient  taking differently: Take 20 mg by mouth at bedtime. ) 45 tablet 3  . prednisoLONE acetate (PRED FORTE) 1 % ophthalmic suspension Place 1 drop into both eyes 2 (two) times daily.   1  . prochlorperazine (COMPAZINE) 5 MG tablet TAKE 1 TABLET BY MOUTH THREE TIMES A DAY AS NEEDED FOR VERTIGO OR NAUSEA 60 tablet 1  . Rivaroxaban (XARELTO) 15 MG TABS tablet Take 1 tablet Daily for A fib (Patient taking differently: Take 15 mg by mouth daily with supper. ) 90 tablet 3  . torsemide (DEMADEX) 20 MG tablet Take 2 tablets (40 mg total) by mouth 2 (two) times daily. 360 tablet 2  . traMADol (ULTRAM) 50 MG tablet Take 1 tablet (50 mg total) by mouth every 6 (six) hours as needed. 30 tablet 0  . triamcinolone cream (KENALOG) 0.1 % Apply 1 application topically daily as  needed (for dry skin).     . predniSONE (DELTASONE) 20 MG tablet 2 tablets daily for 3 days, 1 tablet daily for 4 days. (Patient not taking: Reported on 05/30/2019) 10 tablet 0  . umeclidinium-vilanterol (ANORO ELLIPTA) 62.5-25 MCG/INH AEPB Inhale 1 puff into the lungs daily.     No current facility-administered medications on file prior to visit.    Allergies  Allergen Reactions  . Amiodarone Other (See Comments)    PULMONARY TOXICITY  . Diovan [Valsartan] Other (See Comments)    HYPOTENSION  . Doxycycline Diarrhea and Other (See Comments)    VISUAL DISTURBANCE  . Flexeril [Cyclobenzaprine] Other (See Comments)    FATIGUE  . Keflex [Cephalexin] Diarrhea  . Verapamil Other (See Comments)    EDEMA  . Codeine Hives    Current Problems (verified) Patient Active Problem List   Diagnosis Date Noted  . Senile purpura ([place]) 05/30/2019  . Mononeuropathy 11/23/2018  . 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 Coronary artery disease due to lipid rich plaque 06/14/2018  . COPD (chronic obstructive pulmonary disease) with chronic bronchitis ([place]) 02/02/2018  . Aortic atherosclerosis ([place]) 10/17/2017   . Depression 09/21/2017  . PAH (pulmonary artery hypertension) ([place]) 06/23/2017  . Esophageal dysphagia 05/15/2017  . Gout 03/20/2017  . Abnormal glucose 09/19/2015  . AAA (abdominal aortic aneurysm) without rupture ([place]) 04/22/2015  . Morbid obesity ([place]) 08/03/2014  . PVD (peripheral vascular disease) with claudication ([place]) 10/16/2013  . CKD (chronic kidney disease) stage 3, GFR 30-59 ml/min 06/08/2013  . Hyperlipidemia, mixed 06/08/2013  . 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 Long term current use of anticoagulant therapy 04/23/2013  . Vitamin D deficiency 02/15/2013  . Hypothyroidism   . Osteopenia   . Chronic combined systolic and diastolic CHF (congestive heart failure) ([place]) 11/25/2008  . Migraine headache 11/22/2008  . Essential hypertension 11/22/2008  . Atherosclerosis of native coronary artery of native heart without angina pectoris 11/22/2008  . Atrial fibrillation ([place]) 11/22/2008  . Chronic obstructive pulmonary disease with hypoxia ([place]) 11/22/2008  . Gastroesophageal reflux disease without esophagitis 11/22/2008    Screening Tests Immunization History  Administered Date(s) Administered  . Influenza, High Dose Seasonal PF 12/20/2013, 12/09/2014, 11/14/2015, 01/10/2017, 12/02/2017, 11/23/2018  . Influenza-Unspecified 01/02/2013  . PPD Test 07/17/2016  . Pneumococcal Conjugate-13 01/23/2014  . Pneumococcal-Unspecified 03/15/1993, 05/31/2008  . Td 03/16/2007  . Tdap 08/11/2017  . Varicella 02/19/2008   Preventative care: Last colonoscopy: Normal in 2014, declines any further colonoscopy Last mammogram: 02/2018, reports had 03/2019 at [facility], pending receipt of report  DEXA: 02/2018 normal -0.8  PFTs 02/2016 MRI 2018 Stress test 2014 Echo 2019  Prior vaccinations: TD or Tdap: 2019   Influenza: 11/2018   Pneumococcal: 2010 Prevnar13: 2015 Shingles/Zostavax: 2009 Covid 19: 2/2, 2021 Moderna  Names of Other Physician/Practitioners you  currently use: 1. [facility]- here for primary care 2. Dr. Patrice Paradise, eye doctor, last visit  2020, annually  3. [facility], dentist, last visit remotely   Patient Care Team: [name], MD as PCP - General (Internal Medicine) Evans Lance, MD as PCP - Electrophysiology (Cardiology) Evans Lance, MD as Consulting Physician (Cardiology) Georgeann Oppenheim, MD as Consulting Physician (Ophthalmology) Terrance Mass, MD (Inactive) as Consulting Physician (Gynecology) Erle Crocker, MD as Consulting Physician (Orthopedic Surgery) Tanda Rockers, MD as Consulting Physician (Pulmonary Disease) Deloria Lair, NP as [facility] Management  SURGICAL HISTORY She  has a past surgical  history that includes Colonoscopy; Tonsillectomy; Abdominal aortic endovascular stent graft (N/A, 04/11/2013); Breast surgery; BIV ICD INSERTION CRT-D (11/10/2017); Cholecystectomy open (1972); Fracture surgery; Tibia fracture surgery (Left, 1970s); Eye surgery (Bilateral); Cataract extraction w/ intraocular lens  implant, bilateral (Bilateral); Varicose vein surgery (Bilateral); Cardiac catheterization; BIV ICD INSERTION CRT-D (N/A, 11/10/2017); and AV NODE ABLATION (N/A, 09/13/2018). FAMILY HISTORY Her family history includes Alcohol abuse in her father; Breast cancer in her sister; Cancer (age of onset: 63) in her mother; Cirrhosis in her mother; Depression in her father; Heart defect in her sister; Heart disease in her daughter and sister; Hyperlipidemia in her son; Hypertension in her brother; Stroke in her sister. SOCIAL HISTORY She  reports that she quit smoking about 16 years ago. Her smoking use included cigarettes. She has a 108.00 pack-year smoking history. She has never used smokeless tobacco. She reports that she does not drink alcohol or use drugs.   MEDICARE WELLNESS OBJECTIVES: Physical activity: Current Exercise Habits: Home exercise routine,  Type of exercise: walking, Time (Minutes): 10, Frequency (Times/Week): 5, Weekly Exercise (Minutes/Week): 50, Intensity: Mild, Exercise limited by: cardiac condition(s);respiratory conditions(s) Cardiac risk factors: Cardiac Risk Factors include: advanced age (>81men, >76 women);dyslipidemia;hypertension;obesity (BMI >30kg/m2);sedentary lifestyle;smoking/ tobacco exposure Depression/mood screen:   Depression screen [facility] 2/9 05/30/2019  Decreased Interest 0  Down, Depressed, Hopeless 0  PHQ - 2 Score 0  Altered sleeping 0  Tired, decreased energy 0  Change in appetite 0  Feeling bad or failure about yourself  0  Trouble concentrating 0  Moving slowly or fidgety/restless 0  Suicidal thoughts 0  PHQ-9 Score 0  Difficult doing work/chores Not difficult at all  Some recent data might be hidden    ADLs:  In your present state of health, do you have any difficulty performing the following activities: 05/30/2019 02/26/2019  Hearing? N N  Vision? N N  Difficulty concentrating or making decisions? N N  Walking or climbing stairs? N N  Comment - -  Dressing or bathing? N N  Doing errands, shopping? N N  Preparing Food and eating ? - -  Using the Toilet? - -  In the past six months, have you accidently leaked urine? - -  Do you have problems with loss of bowel control? - -  Managing your Medications? - -  Managing your Finances? - -  Housekeeping or managing your Housekeeping? - -  Some recent data might be hidden     Cognitive Testing  Alert? Yes  Normal Appearance?Yes  Oriented to person? Yes  Place? Yes   Time? Yes  Recall of three objects?  Yes  Can perform simple calculations? Yes  Displays appropriate judgment?Yes  Can read the correct time from a watch face?Yes  EOL planning: Does Patient Have a Medical Advance Directive?: No Would patient like information on creating a medical advance directive?: No - Patient declined(has papers/info, needs to complete)  Review of Systems   Constitutional: Negative for malaise/fatigue and weight loss.  HENT: Negative for hearing loss and tinnitus.   Eyes: Negative for blurred vision and double vision.  Respiratory: Positive for shortness of breath (With exertion only, at baselin). Negative for cough, sputum production and wheezing.   Cardiovascular: Negative for chest pain, palpitations, orthopnea, claudication, leg swelling and PND.  Gastrointestinal: Negative for abdominal pain, blood in stool, constipation, diarrhea, heartburn, melena, nausea and vomiting.  Genitourinary: Negative.   Musculoskeletal: Negative for falls, joint pain and myalgias.  Skin: Negative for rash.  Neurological: Negative for dizziness,  tingling, sensory change, weakness and headaches.  Endo/Heme/Allergies: Negative for polydipsia.  Psychiatric/Behavioral: Negative.  Negative for depression, memory loss, substance abuse and suicidal ideas. The patient is not nervous/anxious and does not have insomnia.   All other systems reviewed and are negative.    Objective:     Today's Vitals   05/30/19 1130  BP: 134/72  Pulse: 71  Temp: (!) 96.6 F (35.9 C)  SpO2: 93%  Weight: 196 lb (88.9 kg)   Body mass index is 34.18 kg/m.  General Appearance: Well nourished, in no apparent distress. Eyes: PERRLA, EOMs, conjunctiva no swelling or erythema Sinuses: No Frontal/maxillary tenderness ENT/Mouth: Ext aud canals clear, TMs without erythema, bulging.Marland Kitchen No erythema, swelling, or exudate on post pharynx.  Tonsils not swollen or erythematous. Hearing normal.  Neck: Supple, thyroid normal.  Respiratory: Respiratory effort normal, BS equal bilaterally without rales, rhonchi, wheezing or stridor.  Cardio: RRR, 3/6 systolic murmur noted. Thready peripheral pulses, minimal peripheral edema   Abdomen: Soft, + BS.  Non tender, no guarding, rebound, hernias, masses. Lymphatics: Non tender without lymphadenopathy.  Musculoskeletal: Full ROM, 5/5 strength, Antalgic  gait. Skin: Warm, dry without rashes, lesions, ecchymosis.  Neuro: Cranial nerves intact. No cerebellar symptoms. Sensation intact to touch bil feet, diminished through feet and ankles bil to monofilament Psych: Awake and oriented X 3, normal affect, Insight and Judgment appropriate.    Medicare Attestation I have personally reviewed: The patient's medical and social history Their use of alcohol, tobacco or illicit drugs Their current medications and supplements The patient's functional ability including ADLs,fall risks, home safety risks, cognitive, and hearing and visual impairment Diet and physical activities Evidence for depression or mood disorders  The patient's weight, height, BMI, and visual acuity have been recorded in the chart.  I have made referrals, counseling, and provided education to the patient based on review of the above and I have provided the patient with a written personalized care plan for preventive services.     Izora Ribas, NP   05/30/2019

## 2019-05-29 ENCOUNTER — Ambulatory Visit: Payer: Medicare Other | Admitting: Adult Health

## 2019-05-30 ENCOUNTER — Other Ambulatory Visit: Payer: Self-pay

## 2019-05-30 ENCOUNTER — Ambulatory Visit (INDEPENDENT_AMBULATORY_CARE_PROVIDER_SITE_OTHER): Payer: Medicare Other | Admitting: Adult Health

## 2019-05-30 ENCOUNTER — Encounter: Payer: Self-pay | Admitting: Adult Health

## 2019-05-30 VITALS — BP 134/72 | HR 71 | Temp 96.6°F | Wt 196.0 lb

## 2019-05-30 DIAGNOSIS — R1310 Dysphagia, unspecified: Secondary | ICD-10-CM

## 2019-05-30 DIAGNOSIS — R1319 Other dysphagia: Secondary | ICD-10-CM

## 2019-05-30 DIAGNOSIS — I2510 Atherosclerotic heart disease of native coronary artery without angina pectoris: Secondary | ICD-10-CM

## 2019-05-30 DIAGNOSIS — I700 Atherosclerosis of aorta: Secondary | ICD-10-CM

## 2019-05-30 DIAGNOSIS — Z1212 Encounter for screening for malignant neoplasm of rectum: Secondary | ICD-10-CM | POA: Diagnosis not present

## 2019-05-30 DIAGNOSIS — E782 Mixed hyperlipidemia: Secondary | ICD-10-CM

## 2019-05-30 DIAGNOSIS — Z1211 Encounter for screening for malignant neoplasm of colon: Secondary | ICD-10-CM

## 2019-05-30 DIAGNOSIS — I2721 Secondary pulmonary arterial hypertension: Secondary | ICD-10-CM | POA: Diagnosis not present

## 2019-05-30 DIAGNOSIS — R6889 Other general symptoms and signs: Secondary | ICD-10-CM

## 2019-05-30 DIAGNOSIS — M069 Rheumatoid arthritis, unspecified: Secondary | ICD-10-CM | POA: Insufficient documentation

## 2019-05-30 DIAGNOSIS — Z0000 Encounter for general adult medical examination without abnormal findings: Secondary | ICD-10-CM

## 2019-05-30 DIAGNOSIS — I739 Peripheral vascular disease, unspecified: Secondary | ICD-10-CM

## 2019-05-30 DIAGNOSIS — E669 Obesity, unspecified: Secondary | ICD-10-CM | POA: Insufficient documentation

## 2019-05-30 DIAGNOSIS — I2583 Coronary atherosclerosis due to lipid rich plaque: Secondary | ICD-10-CM

## 2019-05-30 DIAGNOSIS — M109 Gout, unspecified: Secondary | ICD-10-CM

## 2019-05-30 DIAGNOSIS — I714 Abdominal aortic aneurysm, without rupture, unspecified: Secondary | ICD-10-CM

## 2019-05-30 DIAGNOSIS — I482 Chronic atrial fibrillation, unspecified: Secondary | ICD-10-CM

## 2019-05-30 DIAGNOSIS — Z0001 Encounter for general adult medical examination with abnormal findings: Secondary | ICD-10-CM | POA: Diagnosis not present

## 2019-05-30 DIAGNOSIS — F324 Major depressive disorder, single episode, in partial remission: Secondary | ICD-10-CM

## 2019-05-30 DIAGNOSIS — I5042 Chronic combined systolic (congestive) and diastolic (congestive) heart failure: Secondary | ICD-10-CM | POA: Diagnosis not present

## 2019-05-30 DIAGNOSIS — J8410 Pulmonary fibrosis, unspecified: Secondary | ICD-10-CM

## 2019-05-30 DIAGNOSIS — R7309 Other abnormal glucose: Secondary | ICD-10-CM

## 2019-05-30 DIAGNOSIS — N1832 Chronic kidney disease, stage 3b: Secondary | ICD-10-CM

## 2019-05-30 DIAGNOSIS — K219 Gastro-esophageal reflux disease without esophagitis: Secondary | ICD-10-CM

## 2019-05-30 DIAGNOSIS — G43909 Migraine, unspecified, not intractable, without status migrainosus: Secondary | ICD-10-CM

## 2019-05-30 DIAGNOSIS — M8580 Other specified disorders of bone density and structure, unspecified site: Secondary | ICD-10-CM

## 2019-05-30 DIAGNOSIS — E039 Hypothyroidism, unspecified: Secondary | ICD-10-CM

## 2019-05-30 DIAGNOSIS — J449 Chronic obstructive pulmonary disease, unspecified: Secondary | ICD-10-CM

## 2019-05-30 DIAGNOSIS — D692 Other nonthrombocytopenic purpura: Secondary | ICD-10-CM | POA: Insufficient documentation

## 2019-05-30 DIAGNOSIS — E559 Vitamin D deficiency, unspecified: Secondary | ICD-10-CM

## 2019-05-30 DIAGNOSIS — G589 Mononeuropathy, unspecified: Secondary | ICD-10-CM

## 2019-05-30 DIAGNOSIS — Z95810 Presence of automatic (implantable) cardiac defibrillator: Secondary | ICD-10-CM

## 2019-05-30 DIAGNOSIS — I10 Essential (primary) hypertension: Secondary | ICD-10-CM

## 2019-05-30 DIAGNOSIS — Z7901 Long term (current) use of anticoagulants: Secondary | ICD-10-CM

## 2019-05-30 DIAGNOSIS — R0902 Hypoxemia: Secondary | ICD-10-CM

## 2019-05-30 LAB — POC HEMOCCULT BLD/STL (HOME/3-CARD/SCREEN)
Card #2 Fecal Occult Blod, POC: NEGATIVE
Card #3 Fecal Occult Blood, POC: NEGATIVE
Fecal Occult Blood, POC: NEGATIVE

## 2019-05-30 NOTE — Patient Instructions (Addendum)
    Ms. Depner , Thank you for taking time to come for your Medicare Wellness Visit. I appreciate your ongoing commitment to your health goals. Please review the following plan we discussed and let me know if I can assist you in the future.   These are the goals we discussed: Goals    . Client verbalize knowledge of Heart Failure diease self management skills by monitoring her weight and blood pressure daily     Check weight daily Monitor BP daily Decrease sugar Emphasized salt restriction, less than 2000mg  a day.  call office if 5 lb weight loss or gain in a day- if gain can take 1/2 of zaroxyln as needed with extra potassium pill.  Encouraged regular exercise. If any increasing shortness of breath, swelling, or chest pressure go to ER immediately.  decrease your fluid intake to less than 2 L daily please remember to always increase your potassium intake with any increase of your fluid pill.      Marland Kitchen DIET - DECREASE SODA OR JUICE INTAKE     Cut back on sweet tea    . Weight (lb) < 185 lb (83.9 kg)       This is a list of the screening recommended for you and due dates:  Health Maintenance  Topic Date Due  . DEXA scan (bone density measurement)  02/28/2023  . Tetanus Vaccine  08/12/2027  . Flu Shot  Completed  . Pneumonia vaccines  Completed    Ask cardiology do you need to take Aspirin AND xarelto? If yes how long?    Add mucinex/guaifenacin as needed for thick lung secretions - take with plenty of water - helps thin secretions to make easier to get  Can try stopping singulair/monteleukast for a week or two - helps with allergies/breathing - if no big difference stay off of this medication

## 2019-05-31 LAB — COMPLETE METABOLIC PANEL WITH GFR
AG Ratio: 1.9 (calc) (ref 1.0–2.5)
ALT: 17 U/L (ref 6–29)
AST: 19 U/L (ref 10–35)
Albumin: 3.9 g/dL (ref 3.6–5.1)
Alkaline phosphatase (APISO): 48 U/L (ref 37–153)
BUN/Creatinine Ratio: 15 (calc) (ref 6–22)
BUN: 19 mg/dL (ref 7–25)
CO2: 25 mmol/L (ref 20–32)
Calcium: 9.4 mg/dL (ref 8.6–10.4)
Chloride: 104 mmol/L (ref 98–110)
Creat: 1.31 mg/dL — ABNORMAL HIGH (ref 0.60–0.93)
GFR, Est African American: 45 mL/min/{1.73_m2} — ABNORMAL LOW (ref >=60)
GFR, Est Non African American: 39 mL/min/{1.73_m2} — ABNORMAL LOW (ref >=60)
Globulin: 2.1 g/dL (calc) (ref 1.9–3.7)
Glucose, Bld: 80 mg/dL (ref 65–99)
Potassium: 3.8 mmol/L (ref 3.5–5.3)
Sodium: 143 mmol/L (ref 135–146)
Total Bilirubin: 0.8 mg/dL (ref 0.2–1.2)
Total Protein: 6 g/dL — ABNORMAL LOW (ref 6.1–8.1)

## 2019-05-31 LAB — CBC WITH DIFFERENTIAL/PLATELET
Absolute Monocytes: 952 cells/uL — ABNORMAL HIGH (ref 200–950)
Basophils Absolute: 62 cells/uL (ref 0–200)
Basophils Relative: 0.7 %
Eosinophils Absolute: 249 cells/uL (ref 15–500)
Eosinophils Relative: 2.8 %
HCT: 43.9 % (ref 35.0–45.0)
Hemoglobin: 14.3 g/dL (ref 11.7–15.5)
Lymphs Abs: 1380 cells/uL (ref 850–3900)
MCH: 28.2 pg (ref 27.0–33.0)
MCHC: 32.6 g/dL (ref 32.0–36.0)
MCV: 86.6 fL (ref 80.0–100.0)
MPV: 11.3 fL (ref 7.5–12.5)
Monocytes Relative: 10.7 %
Neutro Abs: 6257 cells/uL (ref 1500–7800)
Neutrophils Relative %: 70.3 %
Platelets: 214 10*3/uL (ref 140–400)
RBC: 5.07 10*6/uL (ref 3.80–5.10)
RDW: 15.4 % — ABNORMAL HIGH (ref 11.0–15.0)
Total Lymphocyte: 15.5 %
WBC: 8.9 10*3/uL (ref 3.8–10.8)

## 2019-05-31 LAB — MAGNESIUM: Magnesium: 2.2 mg/dL (ref 1.5–2.5)

## 2019-05-31 LAB — LIPID PANEL
Cholesterol: 145 mg/dL (ref <200)
HDL: 39 mg/dL — ABNORMAL LOW (ref >=50)
LDL Cholesterol (Calc): 85 mg/dL (calc)
Non-HDL Cholesterol (Calc): 106 mg/dL (calc) (ref <130)
Total CHOL/HDL Ratio: 3.7 (calc) (ref <5.0)
Triglycerides: 112 mg/dL (ref <150)

## 2019-05-31 LAB — TSH: TSH: 4.71 mIU/L — ABNORMAL HIGH (ref 0.40–4.50)

## 2019-06-15 DIAGNOSIS — J449 Chronic obstructive pulmonary disease, unspecified: Secondary | ICD-10-CM | POA: Diagnosis not present

## 2019-06-22 DIAGNOSIS — M25572 Pain in left ankle and joints of left foot: Secondary | ICD-10-CM | POA: Diagnosis not present

## 2019-06-22 DIAGNOSIS — M069 Rheumatoid arthritis, unspecified: Secondary | ICD-10-CM | POA: Diagnosis not present

## 2019-06-22 DIAGNOSIS — Z79899 Other long term (current) drug therapy: Secondary | ICD-10-CM | POA: Diagnosis not present

## 2019-06-22 DIAGNOSIS — M1990 Unspecified osteoarthritis, unspecified site: Secondary | ICD-10-CM | POA: Diagnosis not present

## 2019-06-22 DIAGNOSIS — M549 Dorsalgia, unspecified: Secondary | ICD-10-CM | POA: Diagnosis not present

## 2019-06-24 ENCOUNTER — Other Ambulatory Visit: Payer: Self-pay | Admitting: Physician Assistant

## 2019-06-24 ENCOUNTER — Other Ambulatory Visit: Payer: Self-pay | Admitting: Internal Medicine

## 2019-06-25 ENCOUNTER — Ambulatory Visit: Payer: Medicare Other | Admitting: *Deleted

## 2019-06-26 ENCOUNTER — Encounter: Payer: Self-pay | Admitting: Internal Medicine

## 2019-06-26 ENCOUNTER — Other Ambulatory Visit: Payer: Self-pay

## 2019-06-26 ENCOUNTER — Ambulatory Visit: Payer: Medicare Other | Admitting: Internal Medicine

## 2019-06-26 VITALS — BP 130/70 | HR 71 | Ht 63.5 in | Wt 188.0 lb

## 2019-06-26 DIAGNOSIS — I5042 Chronic combined systolic (congestive) and diastolic (congestive) heart failure: Secondary | ICD-10-CM

## 2019-06-26 DIAGNOSIS — I482 Chronic atrial fibrillation, unspecified: Secondary | ICD-10-CM

## 2019-06-26 DIAGNOSIS — I10 Essential (primary) hypertension: Secondary | ICD-10-CM | POA: Diagnosis not present

## 2019-06-26 DIAGNOSIS — Z95810 Presence of automatic (implantable) cardiac defibrillator: Secondary | ICD-10-CM | POA: Diagnosis not present

## 2019-06-26 LAB — CUP PACEART INCLINIC DEVICE CHECK
Date Time Interrogation Session: 20210413000000
HighPow Impedance: 74 Ohm
Implantable Lead Implant Date: 20190829
Implantable Lead Implant Date: 20190829
Implantable Lead Implant Date: 20190829
Implantable Lead Location: 753858
Implantable Lead Location: 753859
Implantable Lead Location: 753860
Implantable Lead Model: 292
Implantable Lead Model: 4674
Implantable Lead Model: 7740
Implantable Lead Serial Number: 446776
Implantable Lead Serial Number: 696999
Implantable Lead Serial Number: 827497
Implantable Pulse Generator Implant Date: 20190829
Lead Channel Impedance Value: 407 Ohm
Lead Channel Impedance Value: 593 Ohm
Lead Channel Impedance Value: 955 Ohm
Lead Channel Pacing Threshold Amplitude: 0.7 V
Lead Channel Pacing Threshold Amplitude: 0.7 V
Lead Channel Pacing Threshold Pulse Width: 0.4 ms
Lead Channel Pacing Threshold Pulse Width: 0.4 ms
Lead Channel Sensing Intrinsic Amplitude: 0.5 mV
Lead Channel Setting Pacing Amplitude: 2 V
Lead Channel Setting Pacing Amplitude: 2 V
Lead Channel Setting Pacing Pulse Width: 0.4 ms
Lead Channel Setting Pacing Pulse Width: 0.4 ms
Lead Channel Setting Sensing Sensitivity: 0.4 mV
Lead Channel Setting Sensing Sensitivity: 1 mV
Pulse Gen Serial Number: 215092

## 2019-06-26 NOTE — Progress Notes (Signed)
HPI Katie [name] returns today for followup. She is a pleasant 79 yo woman with a non-ischemic CM, chronic systolic heart failure, and chronic uncontrolled atrial fib. She has been stable in the interim. She has chronic lung disease. She has class 2 dyspnea. She get sob when she walks outside but can perform house work inside without too much problem. She has not had syncope or any ICD shock. She thinks that her dyspnea and energy level are improved since she underwent catheter ablation of her AV node to slow down her uncontrolled atrial fib.  Allergies  Allergen Reactions  . Amiodarone Other (See Comments)    PULMONARY TOXICITY  . Diovan [Valsartan] Other (See Comments)    HYPOTENSION  . Doxycycline Diarrhea and Other (See Comments)    VISUAL DISTURBANCE  . Flexeril [Cyclobenzaprine] Other (See Comments)    FATIGUE  . Keflex [Cephalexin] Diarrhea  . Verapamil Other (See Comments)    EDEMA  . Codeine Hives     Current Outpatient Medications  Medication Sig Dispense Refill  . albuterol (PROAIR HFA) 108 (90 Base) MCG/ACT inhaler Use 2 Inhalations 15 minutes Apart every 4 hours to Rescue Asthma Attack 42.5 Inhaler 3  . allopurinol (ZYLOPRIM) 300 MG tablet TAKE 1 TABLET BY MOUTH EVERY DAY 90 tablet 1  . amitriptyline (ELAVIL) 25 MG tablet Take 1 tablet 4 x /day for Neuropathy Pains 360 tablet 1  . aspirin EC 81 MG tablet Take 81 mg by mouth daily.    . Budeson-Glycopyrrol-Formoterol (BREZTRI AEROSPHERE IN) Inhale into the lungs 2 (two) times daily.    . cetirizine (ZYRTEC) 10 MG tablet Take 10 mg by mouth daily as needed for allergies.     Marland Kitchen digoxin (LANOXIN) 0.125 MG tablet Take 1 tablet (0.125 mg total) by mouth daily. 90 tablet 1  . Eyelid Cleansers (AVENOVA) 0.01 % SOLN Apply 1 application topically See admin instructions. Wash eyelids twice daily with cleaner  99  . gabapentin (NEURONTIN) 600 MG tablet TAKE 1 TABLET BY MOUTH THREE TIMES A DAY FOR PAIN 360 tablet 1  .  guaiFENesin (MUCINEX) 600 MG 12 hr tablet Take 600 mg by mouth daily as needed for cough or to loosen phlegm.     Marland Kitchen ipratropium (ATROVENT) 0.06 % nasal spray Use 1 to 2 sprays each Nostril 2 to 3 x / day as needed 45 mL 3  . ipratropium-albuterol (DUONEB) 0.5-2.5 (3) MG/3ML SOLN Inhale 3 mLs into the lungs every 6 (six) hours as needed (for shortness of breath or wheezing). 150 mL 2  . leflunomide (ARAVA) 10 MG tablet Take 10 mg by mouth daily.     Marland Kitchen levothyroxine (SYNTHROID) 100 MCG tablet Take 1 tablet daily on an empty stomach with only water for 30 minutes & no Antacid meds, Calcium or Magnesium for 4 hours & avoid Biotin 90 tablet 3  . montelukast (SINGULAIR) 10 MG tablet TAKE 1 TABLET DAILY FOR ALLERGIES & ASTHMA 90 tablet 3  . Olopatadine HCl 0.2 % SOLN     . pantoprazole (PROTONIX) 40 MG tablet Take 1 tablet (40 mg total) by mouth daily.    [name] Glycol-Propyl Glycol (SYSTANE) 0.4-0.3 % SOLN Place 1 drop into both eyes 4 (four) times daily as needed (for dry eyes).     . potassium chloride (KLOR-CON) 20 MEQ packet Take 20 mEq by mouth daily.    . pravastatin (PRAVACHOL) 20 MG tablet Take 1 tablet Daily for Cholesterol 90 tablet 0  .  prednisoLONE acetate (PRED FORTE) 1 % ophthalmic suspension Place 1 drop into both eyes 2 (two) times daily.   1  . predniSONE (DELTASONE) 20 MG tablet 2 tablets daily for 3 days, 1 tablet daily for 4 days. 10 tablet 0  . Rivaroxaban (XARELTO) 15 MG TABS tablet Take 1 tablet Daily for Afib &  to Prevent Blood Clots 90 tablet 3  . torsemide (DEMADEX) 20 MG tablet Take 2 tablets (40 mg total) by mouth 2 (two) times daily. 360 tablet 2  . traMADol (ULTRAM) 50 MG tablet Take 1 tablet (50 mg total) by mouth every 6 (six) hours as needed. 30 tablet 0  . umeclidinium-vilanterol (ANORO ELLIPTA) 62.5-25 MCG/INH AEPB Inhale 1 puff into the lungs daily.     No current facility-administered medications for this visit.     Past Medical History:  Diagnosis Date  .  AAA (abdominal aortic aneurysm) ([place])   . AICD (automatic cardioverter/defibrillator) present 11/10/2017  . Arthritis    "some in my knees" (11/10/2017)  . Atrial fibrillation ([place])   . CHF (congestive heart failure) ([place])   . Chronic bronchitis ([place])   . COPD (chronic obstructive pulmonary disease) ([place])   . Depression   . GERD (gastroesophageal reflux disease)   . Gout    "daily RX" (11/10/2017)  . History of hiatal hernia   . Hyperlipidemia   . Hypertension   . Hypothyroid   . Migraine headache    "hx; none since 1980s" (11/10/2017)  . Osteopenia   . Pneumonia    "~ 3 times" (11/10/2017)  . PVD (peripheral vascular disease) ([place])     ROS:   All systems reviewed and negative except as noted in the HPI.   Past Surgical History:  Procedure Laterality Date  . ABDOMINAL AORTIC ENDOVASCULAR STENT GRAFT N/A 04/11/2013   Procedure: ABDOMINAL AORTIC ENDOVASCULAR STENT GRAFT WITH RIGHT FEMORAL PATCH ANGIOPLASTY;  Surgeon: Mal Misty, MD;  Location: [facility];  Service: Vascular;  Laterality: N/A;  . AV NODE ABLATION N/A 09/13/2018   Procedure: AV NODE ABLATION;  Surgeon: Evans Lance, MD;  Location: [facility];  Service: Cardiovascular;  Laterality: N/A;  . BIV ICD INSERTION CRT-D  11/10/2017  . BIV ICD INSERTION CRT-D N/A 11/10/2017   Procedure: BIV ICD INSERTION CRT-D;  Surgeon: Evans Lance, MD;  Location: [facility];  Service: Cardiovascular;  Laterality: N/A;  . BREAST SURGERY     LEFT BREAST BIOPSY  . CARDIAC CATHETERIZATION    . CATARACT EXTRACTION W/ INTRAOCULAR LENS  IMPLANT, BILATERAL Bilateral   . CHOLECYSTECTOMY OPEN  1972  . COLONOSCOPY    . EYE SURGERY Bilateral    "bleeding in my eyes"  . FRACTURE SURGERY    . TIBIA FRACTURE SURGERY Left 1970s  . TONSILLECTOMY    . VARICOSE VEIN SURGERY Bilateral    "laser"     Family History  Problem Relation Age of Onset  . Cirrhosis Mother   . Cancer Mother 11       PANCREAS  . Heart defect Sister    . Breast cancer Sister        age 60  . Heart disease Sister   . Stroke Sister   . Alcohol abuse Father   . Depression Father   . Hypertension Brother   . Hyperlipidemia Son   . Heart disease Daughter      Social History   Socioeconomic History  . Marital status: Married    Spouse  name: Not on file  . Number of children: Not on file  . Years of education: Not on file  . Highest education level: Not on file  Occupational History  . Not on file  Tobacco Use  . Smoking status: Former Smoker    Packs/day: 2.00    Years: 54.00    Pack years: 108.00    Types: Cigarettes    Quit date: 08/10/2002    Years since quitting: 16.8  . Smokeless tobacco: Never Used  Substance and Sexual Activity  . Alcohol use: Never  . Drug use: Never  . Sexual activity: Not Currently    Birth control/protection: Post-menopausal  Other Topics Concern  . Not on file  Social History Narrative   Lives in [place] with husband   One daughter   Social Determinants of Health   Financial Resource Strain:   . Difficulty of Paying Living Expenses:   Food Insecurity:   . Worried About Charity fundraiser in the Last Year:   . Arboriculturist in the Last Year:   Transportation Needs:   . Film/video editor (Medical):   Marland Kitchen Lack of Transportation (Non-Medical):   Physical Activity:   . Days of Exercise per Week:   . Minutes of Exercise per Session:   Stress:   . Feeling of Stress :   Social Connections:   . Frequency of Communication with Friends and Family:   . Frequency of Social Gatherings with Friends and Family:   . Attends Religious Services:   . Active Member of Clubs or Organizations:   . Attends Archivist Meetings:   Marland Kitchen Marital Status:   Intimate Partner Violence:   . Fear of Current or Ex-Partner:   . Emotionally Abused:   Marland Kitchen Physically Abused:   . Sexually Abused:      BP 130/70   Pulse 71   Ht 5' 3.5" (1.613 m)   Wt 188 lb (85.3 kg)   SpO2 93%   BMI 32.78  kg/m   Physical Exam:  Well appearing NAD HEENT: Unremarkable Neck:  No JVD, no thyromegally Lymphatics:  No adenopathy Back:  No CVA tenderness Lungs:  Clear with no wheezes HEART:  Regular rate rhythm, no murmurs, no rubs, no clicks Abd:  soft, positive bowel sounds, no organomegally, no rebound, no guarding Ext:  2 plus pulses, no edema, no cyanosis, no clubbing Skin:  No rashes no nodules Neuro:  CN II through XII intact, motor grossly intact  DEVICE  Normal device function.  See PaceArt for details.   Assess/Plan: 1. Atrial fib - her rates are well controlled, s/p AV node ablation.  2. ICD - her [place] ICD is working normally. No change.  3. Chronic systolic heart failure - her symptoms are class 2. She will continue her current meds.  4. Dyspnea - unclear if this is due to her COPD/lung disease or chronic systolic heart failure or whether it is do to her atrial fib. I asked her to reduce her salt intake and maintain a low sodium diet. Also, I encouraged her to try and use her oxygen by n/c when she is walking outside.   Mikle Bosworth.D.

## 2019-06-26 NOTE — Patient Instructions (Signed)
Medication Instructions:  Your physician recommends that you continue on your current medications as directed. Please refer to the Current Medication list given to you today.  Labwork: None ordered.  Testing/Procedures: None ordered.  Follow-Up: Your physician wants you to follow-up in: one year with Dr. Lovena Le.   You will receive a reminder letter in the mail two months in advance. If you don't receive a letter, please call our office to schedule the follow-up appointment.  Remote monitoring is used to monitor your ICD from home. This monitoring reduces the number of office visits required to check your device to one time per year. It allows [place] to keep an eye on the functioning of your device to ensure it is working properly. You are scheduled for a device check from home on 08/15/2019. You may send your transmission at any time that day. If you have a wireless device, the transmission will be sent automatically. After your physician reviews your transmission, you will receive a postcard with your next transmission date.  Any Other Special Instructions Will Be Listed Below (If Applicable).  If you need a refill on your cardiac medications before your next appointment, please call your pharmacy.

## 2019-07-02 ENCOUNTER — Other Ambulatory Visit: Payer: Self-pay | Admitting: *Deleted

## 2019-07-02 ENCOUNTER — Encounter: Payer: Self-pay | Admitting: *Deleted

## 2019-07-02 NOTE — Patient Outreach (Signed)
[name]) Care Management  07/02/2019  Katie [name] 11/19/1940 [id number]   Quarterly outreach:  Chart review reveals pt has recently has her AWV, appropriate Cardiology follow up visits. She has had her COVID vaccines. She did have some minor vaginal bleeding in January-February but was evaluated and this has since resolved.  She does admit to having some allergy sxs with the strong prevalence of pollen in the air. She continues to wear her mask, she takes a daily generic Claritin.  Encouraged saline nasal spray to rinse her nares, sinuses after she comes in from being outdoors.  We have decided that we can stretch our next call out to 6 months. She knows she can call me or of course her primary care or cardiology office as needed.  Praised her for her diligent self care and seeking medical attention quickly when problems arise.   I will call her again in October.  [name]. [name], MSN, [facility] Gerontological Nurse Practitioner [facility] Care Management [phone]

## 2019-07-09 ENCOUNTER — Other Ambulatory Visit: Payer: Self-pay

## 2019-07-09 ENCOUNTER — Encounter: Payer: Self-pay | Admitting: Orthopedic Surgery

## 2019-07-09 ENCOUNTER — Ambulatory Visit: Payer: Self-pay

## 2019-07-09 ENCOUNTER — Ambulatory Visit: Payer: Medicare Other | Admitting: Orthopedic Surgery

## 2019-07-09 VITALS — Ht 63.0 in | Wt 188.0 lb

## 2019-07-09 DIAGNOSIS — M25511 Pain in right shoulder: Secondary | ICD-10-CM | POA: Diagnosis not present

## 2019-07-09 MED ORDER — METHYLPREDNISOLONE ACETATE 40 MG/ML IJ SUSP
40.0000 mg | INTRAMUSCULAR | Status: AC | PRN
  Administered 2019-07-09: 40 mg via INTRA_ARTICULAR

## 2019-07-09 MED ORDER — LIDOCAINE HCL 1 % IJ SOLN
5.0000 mL | INTRAMUSCULAR | Status: AC | PRN
Start: 2019-07-09 — End: 2019-07-09
  Administered 2019-07-09: 13:00:00 5 mL

## 2019-07-09 NOTE — Progress Notes (Signed)
Office Visit Note   Patient: Katie [name]           Date of Birth: 1941/03/03           MRN: [id number] Visit Date: 07/09/2019              Requested by: [name], [facility] [place],  [place] [address] PCP: [name], MD  Chief Complaint  Patient presents with  . Right Shoulder - Pain    Acute onset "woke up this morning and could not move"       HPI: Patient is a 79 year old woman who states that she woke up at 2 AM with acute right shoulder pain.  She describes this as a terrible pain she could not fall back asleep she could not get dressed due to decreased range of motion she has been using Tylenol without relief.  Patient states that she picked up a grandchild yesterday and this may have precipitated her problem.  Assessment & Plan: Visit Diagnoses:  1. Acute pain of right shoulder     Plan: Patient underwent a subacromial injection reevaluate in 4 weeks.  Discussed that if were not showing any improvement may need to consider an MRI scan.  Follow-Up Instructions: Return in about 4 weeks (around 08/06/2019).   Ortho Exam  Patient is alert, oriented, no adenopathy, well-dressed, normal affect, normal respiratory effort. Examination patient has active abduction of flexion to 70 degrees passively she has range of motion 120 degrees however this is painful.  She has pain with Neer and Hawkins impingement test pain with a drop arm test.  Imaging: XR Shoulder Right  Result Date: 07/09/2019 2 view radiographs of the right shoulder shows no bony abnormalities no fractures.  No images are attached to the encounter.  Labs: Lab Results  Component Value Date   HGBA1C 5.7 (H) 02/27/2019   HGBA1C 5.5 01/05/2018   HGBA1C 6.0 (H) 09/29/2017   ESRSEDRATE 6 11/30/2018   ESRSEDRATE 11 07/04/2018   ESRSEDRATE 48 (H) 07/11/2017   CRP 25.8 (H) 07/04/2018   LABURIC 6.3 02/27/2019   LABURIC 5.6 11/30/2018   LABURIC 5.3 07/04/2018   REPTSTATUS  05/12/2017 FINAL 05/07/2017   GRAMSTAIN  12/24/2015    ABUNDANT WBC PRESENT,BOTH PMN AND MONONUCLEAR RARE GRAM POSITIVE COCCI IN PAIRS RARE GRAM NEGATIVE RODS    CULT  05/07/2017    NO GROWTH 5 DAYS Performed at [facility], [place] [address], [place], [place] [address]    LABORGA Insignificant Growth 05/30/2015     Lab Results  Component Value Date   ALBUMIN 3.6 05/24/2018   ALBUMIN 3.8 05/23/2018   ALBUMIN 3.6 10/31/2017   LABURIC 6.3 02/27/2019   LABURIC 5.6 11/30/2018   LABURIC 5.3 07/04/2018    Lab Results  Component Value Date   MG 2.2 05/30/2019   MG 2.3 02/27/2019   MG 2.1 11/23/2018   Lab Results  Component Value Date   VD25OH 98 02/27/2019   VD25OH 66 01/05/2018   VD25OH 71 09/29/2017    No results found for: PREALBUMIN CBC EXTENDED Latest Ref Rng & Units 05/30/2019 02/27/2019 11/30/2018  WBC 3.8 - 10.8 Thousand/uL 8.9 6.8 8.6  RBC 3.80 - 5.10 Million/uL 5.07 5.33(H) 5.16(H)  HGB 11.7 - 15.5 g/dL 14.3 15.2 15.0  HCT 35.0 - 45.0 % 43.9 46.5(H) 45.1(H)  PLT 140 - 400 Thousand/uL 214 137(L) 223  NEUTROABS 1,500 - 7,800 cells/uL 6,257 3,597 5,332  LYMPHSABS 850 - 3,900 cells/uL 1,380  1,897 1,969     Body mass index is 33.3 kg/m.  Orders:  Orders Placed This Encounter  Procedures  . XR Shoulder Right   No orders of the defined types were placed in this encounter.    Procedures: Large Joint Inj: R subacromial bursa on 07/09/2019 1:03 PM Indications: diagnostic evaluation and pain Details: 22 G 1.5 in needle, posterior approach  Arthrogram: No  Medications: 5 mL lidocaine 1 %; 40 mg methylPREDNISolone acetate 40 MG/ML Outcome: tolerated well, no immediate complications Procedure, treatment alternatives, risks and benefits explained, specific risks discussed. Consent was given by the patient. Immediately prior to procedure a time out was called to verify the correct patient, procedure, equipment, support staff and site/side marked as required.  Patient was prepped and draped in the usual sterile fashion.      Clinical Data: No additional findings.  ROS:  All other systems negative, except as noted in the HPI. Review of Systems  Objective: Vital Signs: Ht 5\' 3"  (1.6 m)   Wt 188 lb (85.3 kg)   BMI 33.30 kg/m   Specialty Comments:  No specialty comments available.  PMFS History: Patient Active Problem List   Diagnosis Date Noted  . Senile purpura ([facility]) 05/30/2019  . Rheumatoid arthritis involving multiple sites ([place]) 05/30/2019  . Obesity (BMI 30.0-34.9) 05/30/2019  . Mononeuropathy 11/23/2018  . 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 COPD (chronic obstructive pulmonary disease) with chronic bronchitis ([facility]) 02/02/2018  . Aortic atherosclerosis ([place]) 10/17/2017  . PAH (pulmonary artery hypertension) ([place]) 06/23/2017  . Gout 03/20/2017  . Abnormal glucose 09/19/2015  . AAA (abdominal aortic aneurysm) without rupture ([place]) 04/22/2015  . PVD (peripheral vascular disease) with claudication ([place]) 10/16/2013  . CKD (chronic kidney disease) stage 3, GFR 30-59 ml/min 06/08/2013  . Hyperlipidemia, mixed 06/08/2013  . 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 Long term current use of anticoagulant therapy 04/23/2013  . Vitamin D deficiency 02/15/2013  . Hypothyroidism   . Osteopenia   . Chronic combined systolic and diastolic CHF (congestive heart failure) ([place]) 11/25/2008  . Essential hypertension 11/22/2008  . Atherosclerosis of native coronary artery of native heart without angina pectoris 11/22/2008  . Atrial fibrillation ([place]) 11/22/2008  . Chronic obstructive pulmonary disease with hypoxia ([place]) 11/22/2008  . Gastroesophageal reflux disease without esophagitis 11/22/2008   Past Medical History:  Diagnosis Date  . AAA (abdominal aortic aneurysm) ([place])   . AICD (automatic cardioverter/defibrillator) present 11/10/2017  . Arthritis    "some in my knees"  (11/10/2017)  . Atrial fibrillation ([place])   . CHF (congestive heart failure) ([place])   . Chronic bronchitis ([place])   . COPD (chronic obstructive pulmonary disease) ([place])   . Depression   . GERD (gastroesophageal reflux disease)   . Gout    "daily RX" (11/10/2017)  . History of hiatal hernia   . Hyperlipidemia   . Hypertension   . Hypothyroid   . Migraine headache    "hx; none since 1980s" (11/10/2017)  . Osteopenia   . Pneumonia    "~ 3 times" (11/10/2017)  . PVD (peripheral vascular disease) ([facility])     Family History  Problem Relation Age of Onset  . Cirrhosis Mother   . Cancer Mother 61       PANCREAS  . Heart defect Sister   . Breast cancer Sister        age 38  . Heart disease Sister   . Stroke Sister   . Alcohol abuse  Father   . Depression Father   . Hypertension Brother   . Hyperlipidemia Son   . Heart disease Daughter     Past Surgical History:  Procedure Laterality Date  . ABDOMINAL AORTIC ENDOVASCULAR STENT GRAFT N/A 04/11/2013   Procedure: ABDOMINAL AORTIC ENDOVASCULAR STENT GRAFT WITH RIGHT FEMORAL PATCH ANGIOPLASTY;  Surgeon: Mal Misty, MD;  Location: [facility];  Service: Vascular;  Laterality: N/A;  . AV NODE ABLATION N/A 09/13/2018   Procedure: AV NODE ABLATION;  Surgeon: Evans Lance, MD;  Location: [facility];  Service: Cardiovascular;  Laterality: N/A;  . BIV ICD INSERTION CRT-D  11/10/2017  . BIV ICD INSERTION CRT-D N/A 11/10/2017   Procedure: BIV ICD INSERTION CRT-D;  Surgeon: Evans Lance, MD;  Location: [facility];  Service: Cardiovascular;  Laterality: N/A;  . BREAST SURGERY     LEFT BREAST BIOPSY  . CARDIAC CATHETERIZATION    . CATARACT EXTRACTION W/ INTRAOCULAR LENS  IMPLANT, BILATERAL Bilateral   . CHOLECYSTECTOMY OPEN  1972  . COLONOSCOPY    . EYE SURGERY Bilateral    "bleeding in my eyes"  . FRACTURE SURGERY    . TIBIA FRACTURE SURGERY Left 1970s  . TONSILLECTOMY    . VARICOSE VEIN SURGERY Bilateral    "laser"   Social  History   Occupational History  . Not on file  Tobacco Use  . Smoking status: Former Smoker    Packs/day: 2.00    Years: 54.00    Pack years: 108.00    Types: Cigarettes    Quit date: 08/10/2002    Years since quitting: 16.9  . Smokeless tobacco: Never Used  Substance and Sexual Activity  . Alcohol use: Never  . Drug use: Never  . Sexual activity: Not Currently    Birth control/protection: Post-menopausal

## 2019-07-11 DIAGNOSIS — D649 Anemia, unspecified: Secondary | ICD-10-CM | POA: Diagnosis not present

## 2019-07-11 DIAGNOSIS — N183 Chronic kidney disease, stage 3 unspecified: Secondary | ICD-10-CM | POA: Diagnosis not present

## 2019-07-11 DIAGNOSIS — I129 Hypertensive chronic kidney disease with stage 1 through stage 4 chronic kidney disease, or unspecified chronic kidney disease: Secondary | ICD-10-CM | POA: Diagnosis not present

## 2019-07-11 DIAGNOSIS — E039 Hypothyroidism, unspecified: Secondary | ICD-10-CM | POA: Diagnosis not present

## 2019-07-11 DIAGNOSIS — N2581 Secondary hyperparathyroidism of renal origin: Secondary | ICD-10-CM | POA: Diagnosis not present

## 2019-07-12 ENCOUNTER — Encounter (HOSPITAL_COMMUNITY): Payer: Self-pay | Admitting: Emergency Medicine

## 2019-07-12 ENCOUNTER — Other Ambulatory Visit: Payer: Self-pay

## 2019-07-12 ENCOUNTER — Encounter: Payer: Self-pay | Admitting: Orthopaedic Surgery

## 2019-07-12 ENCOUNTER — Ambulatory Visit (INDEPENDENT_AMBULATORY_CARE_PROVIDER_SITE_OTHER): Payer: Medicare Other | Admitting: Orthopaedic Surgery

## 2019-07-12 ENCOUNTER — Emergency Department (HOSPITAL_COMMUNITY)
Admission: EM | Admit: 2019-07-12 | Discharge: 2019-07-12 | Disposition: A | Discharge status: Home / Self Care | Payer: Medicare Other | Attending: Emergency Medicine | Admitting: Emergency Medicine

## 2019-07-12 DIAGNOSIS — M25511 Pain in right shoulder: Secondary | ICD-10-CM | POA: Diagnosis not present

## 2019-07-12 DIAGNOSIS — M25519 Pain in unspecified shoulder: Secondary | ICD-10-CM | POA: Insufficient documentation

## 2019-07-12 DIAGNOSIS — Z5321 Procedure and treatment not carried out due to patient leaving prior to being seen by health care provider: Secondary | ICD-10-CM | POA: Diagnosis not present

## 2019-07-12 DIAGNOSIS — M25512 Pain in left shoulder: Secondary | ICD-10-CM

## 2019-07-12 MED ORDER — ACETAMINOPHEN-CODEINE #3 300-30 MG PO TABS: 1.0000 | ORAL_TABLET | Freq: Three times a day (TID) | ORAL | 0 refills | Status: DC | PRN

## 2019-07-12 NOTE — ED Notes (Signed)
Pt seen leaving the ED @0700  by registration staff

## 2019-07-12 NOTE — Progress Notes (Signed)
Office Visit Note   Patient: Katie [name]           Date of Birth: 15-Sep-1940           MRN: [id number] Visit Date: 07/12/2019              Requested by: [name], [place] [address] [place],  [place] [address] PCP: [name], MD   Assessment & Plan: Visit Diagnoses:  1. Acute pain of right shoulder   2. Acute pain of both shoulders     Plan: I am at a loss of what she hurts so severely.  She does not appear ill.  I will send in some Tylenol 3 to help with her pain.  She cannot take anti-inflammatories.  She cannot have an MRI due to her defibrillator.  I did review labs from March that showed nothing out of the ordinary.  Her vital signs were normal at the ER last evening.  The only thing I can recommend right now is just giving this time and hopefully her pain will subside.  If it becomes acutely worse she should go to the ER again or see her primary care physician.  I can certainly see her back in a week.  Follow-Up Instructions: Return in about 1 week (around 07/19/2019).   Orders:  No orders of the defined types were placed in this encounter.  Meds ordered this encounter  Medications  . acetaminophen-codeine (TYLENOL #3) 300-30 MG tablet    Sig: Take 1-2 tablets by mouth every 8 (eight) hours as needed for moderate pain.    Dispense:  40 tablet    Refill:  0      Procedures: No procedures performed   Clinical Data: No additional findings.   Subjective: Chief Complaint  Patient presents with  . Right Shoulder - Pain  The patient comes in for evaluation treatment of severe right shoulder pain and now left shoulder pain.  She actually saw my partner Dr. Sharol Given just 3 days ago with severe right shoulder pain when she woke up in pain.  She does have a complex medical history with multiple joint aches and complaints.  She also has a defibrillator and has significant heart issues.  She even went to the emergency room yesterday due to severe pain.   This was last night.  She was there for 4 hours and then left without really being seen because it has been so busy there.  She is here with family today.  She states that her shoulders are just killing her and her pain is not been helped with the steroid injection.  She cannot take anti-inflammatories.  She is on Xarelto.  HPI  Review of Systems She currently denies any fever, chills, nausea, vomiting  Objective: Vital Signs: There were no vitals taken for this visit.  Physical Exam She is alert and orient x3.  She is not appear in any acute distress but she is uncomfortable. Ortho Exam Examination of both shoulder show that they are clinically well located.  She can hold her shoulders at 90 degrees of abduction without them dropping down.  There is no swelling or redness about either shoulder.  I can gently put him through range of motion and she does have a lot of pain out of proportion of exam but nothing worrisome from my standpoint clinically. Specialty Comments:  No specialty comments available.  Imaging: No results found. I did independently review the x-rays of her right shoulder from  earlier this week.  I did not see any acute findings.  The shoulder is well located.  There is AC joint arthritic changes.  PMFS History: Patient Active Problem List   Diagnosis Date Noted  . Senile purpura ([place]) 05/30/2019  . Rheumatoid arthritis involving multiple sites ([place]) 05/30/2019  . Obesity (BMI 30.0-34.9) 05/30/2019  . Mononeuropathy 11/23/2018  . 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 COPD (chronic obstructive pulmonary disease) with chronic bronchitis ([place]) 02/02/2018  . Aortic atherosclerosis ([place]) 10/17/2017  . PAH (pulmonary artery hypertension) ([place]) 06/23/2017  . Gout 03/20/2017  . Abnormal glucose 09/19/2015  . AAA (abdominal aortic aneurysm) without rupture ([place]) 04/22/2015  . PVD (peripheral vascular disease) with claudication  ([place]) 10/16/2013  . CKD (chronic kidney disease) stage 3, GFR 30-59 ml/min 06/08/2013  . Hyperlipidemia, mixed 06/08/2013  . 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 Long term current use of anticoagulant therapy 04/23/2013  . Vitamin D deficiency 02/15/2013  . Hypothyroidism   . Osteopenia   . Chronic combined systolic and diastolic CHF (congestive heart failure) ([place]) 11/25/2008  . Essential hypertension 11/22/2008  . Atherosclerosis of native coronary artery of native heart without angina pectoris 11/22/2008  . Atrial fibrillation ([place]) 11/22/2008  . Chronic obstructive pulmonary disease with hypoxia ([place]) 11/22/2008  . Gastroesophageal reflux disease without esophagitis 11/22/2008   Past Medical History:  Diagnosis Date  . AAA (abdominal aortic aneurysm) ([place])   . AICD (automatic cardioverter/defibrillator) present 11/10/2017  . Arthritis    "some in my knees" (11/10/2017)  . Atrial fibrillation ([place])   . CHF (congestive heart failure) ([place])   . Chronic bronchitis ([place])   . COPD (chronic obstructive pulmonary disease) ([place])   . Depression   . GERD (gastroesophageal reflux disease)   . Gout    "daily RX" (11/10/2017)  . History of hiatal hernia   . Hyperlipidemia   . Hypertension   . Hypothyroid   . Migraine headache    "hx; none since 1980s" (11/10/2017)  . Osteopenia   . Pneumonia    "~ 3 times" (11/10/2017)  . PVD (peripheral vascular disease) ([facility])     Family History  Problem Relation Age of Onset  . Cirrhosis Mother   . Cancer Mother 13       PANCREAS  . Heart defect Sister   . Breast cancer Sister        age 61  . Heart disease Sister   . Stroke Sister   . Alcohol abuse Father   . Depression Father   . Hypertension Brother   . Hyperlipidemia Son   . Heart disease Daughter     Past Surgical History:  Procedure Laterality Date  . ABDOMINAL AORTIC ENDOVASCULAR STENT GRAFT N/A 04/11/2013   Procedure: ABDOMINAL AORTIC ENDOVASCULAR STENT GRAFT  WITH RIGHT FEMORAL PATCH ANGIOPLASTY;  Surgeon: Mal Misty, MD;  Location: [facility];  Service: Vascular;  Laterality: N/A;  . AV NODE ABLATION N/A 09/13/2018   Procedure: AV NODE ABLATION;  Surgeon: Evans Lance, MD;  Location: [facility];  Service: Cardiovascular;  Laterality: N/A;  . BIV ICD INSERTION CRT-D  11/10/2017  . BIV ICD INSERTION CRT-D N/A 11/10/2017   Procedure: BIV ICD INSERTION CRT-D;  Surgeon: Evans Lance, MD;  Location: [facility];  Service: Cardiovascular;  Laterality: N/A;  . BREAST SURGERY     LEFT BREAST BIOPSY  . CARDIAC CATHETERIZATION    . CATARACT EXTRACTION W/ INTRAOCULAR LENS  IMPLANT,  BILATERAL Bilateral   . CHOLECYSTECTOMY OPEN  1972  . COLONOSCOPY    . EYE SURGERY Bilateral    "bleeding in my eyes"  . FRACTURE SURGERY    . TIBIA FRACTURE SURGERY Left 1970s  . TONSILLECTOMY    . VARICOSE VEIN SURGERY Bilateral    "laser"   Social History   Occupational History  . Not on file  Tobacco Use  . Smoking status: Former Smoker    Packs/day: 2.00    Years: 54.00    Pack years: 108.00    Types: Cigarettes    Quit date: 08/10/2002    Years since quitting: 16.9  . Smokeless tobacco: Never Used  Substance and Sexual Activity  . Alcohol use: Never  . Drug use: Never  . Sexual activity: Not Currently    Birth control/protection: Post-menopausal

## 2019-07-12 NOTE — ED Triage Notes (Signed)
Pt reports bilateral shoulder pain. Received a shot in her right shoulder however her left shoulder is causing pain.  Pt alert and oriented, pain is causing her from sleeping.

## 2019-07-15 DIAGNOSIS — J449 Chronic obstructive pulmonary disease, unspecified: Secondary | ICD-10-CM | POA: Diagnosis not present

## 2019-07-16 DIAGNOSIS — N183 Chronic kidney disease, stage 3 unspecified: Secondary | ICD-10-CM | POA: Diagnosis not present

## 2019-07-19 ENCOUNTER — Ambulatory Visit: Payer: Medicare Other | Admitting: Orthopaedic Surgery

## 2019-07-25 ENCOUNTER — Other Ambulatory Visit: Payer: Self-pay | Admitting: Adult Health

## 2019-07-31 ENCOUNTER — Ambulatory Visit: Payer: Medicare Other | Admitting: Physician Assistant

## 2019-08-06 ENCOUNTER — Ambulatory Visit: Payer: Medicare Other | Admitting: Orthopedic Surgery

## 2019-08-07 ENCOUNTER — Encounter: Payer: Self-pay | Admitting: Adult Health Nurse Practitioner

## 2019-08-07 ENCOUNTER — Ambulatory Visit
Admission: RE | Admit: 2019-08-07 | Discharge: 2019-08-07 | Disposition: A | Discharge status: Home / Self Care | Payer: Medicare Other | Source: Ambulatory Visit | Attending: Adult Health Nurse Practitioner | Admitting: Adult Health Nurse Practitioner

## 2019-08-07 ENCOUNTER — Other Ambulatory Visit: Payer: Self-pay

## 2019-08-07 ENCOUNTER — Ambulatory Visit (INDEPENDENT_AMBULATORY_CARE_PROVIDER_SITE_OTHER): Payer: Medicare Other | Admitting: Adult Health Nurse Practitioner

## 2019-08-07 VITALS — BP 138/72 | HR 70 | Temp 97.5°F | Ht 63.0 in | Wt 185.0 lb

## 2019-08-07 DIAGNOSIS — W108XXA Fall (on) (from) other stairs and steps, initial encounter: Secondary | ICD-10-CM | POA: Diagnosis not present

## 2019-08-07 DIAGNOSIS — S299XXA Unspecified injury of thorax, initial encounter: Secondary | ICD-10-CM | POA: Diagnosis not present

## 2019-08-07 DIAGNOSIS — R0781 Pleurodynia: Secondary | ICD-10-CM

## 2019-08-07 NOTE — Patient Instructions (Signed)
Increase Amitriptyline 25mg .  You may take this up to four times a day for pain.  Go to [facility] for your x-ray.  You can walk in the order is placed.   We will contact you with the results.  The most important part is to decrease the pain so that you can take a DEEP breath.  Deep breathing exercise will decrease your risk of pneumonia.

## 2019-08-07 NOTE — Progress Notes (Signed)
Assessment and Plan:  Katie [name] was seen today for fall.  Diagnoses and all orders for this visit:  Fall (on) (from) other stairs and steps, initial encounter -     DG Chest 2 View; Future  Rib pain on right side -     DG Chest 2 View; Future Discussed importance of deep breathing/splinting to prevent pnuemonia. Discussed increasing current amitriptyline for pain management.  Contact office with any new or worsening symptoms.  Further disposition pending results of labs. Discussed med's effects and SE's.   Over 30 minutes of face to face interview, exam, counseling, chart review, and critical decision making was performed.   Future Appointments  Date Time Provider [place]  08/15/2019  1:10 PM [facility] DEVICE REMOTES [facility]  09/05/2019 10:30 AM [name], MD [facility] None  11/12/2019  8:35 AM [facility] DEVICE REMOTES [facility]  12/31/2019  9:00 AM Deloria Lair, NP [facility] None  02/11/2020  8:35 AM [facility] DEVICE REMOTES [facility]  03/17/2020  9:00 AM [name], MD [facility] None  06/16/2020 11:15 AM Liane Comber, NP [facility] None    ------------------------------------------------------------------------------------------------------------------   HPI 79 y.o.female presents for evaluation after a fall.  Katie [name] reports that Katie [name] walked outside at 11am this morning.  Katie [name] fell down five concrete steps into the yard.  Katie [name] was able to get up and call her daughter.  Katie [name] caugtt herself with her left forearm.  Katie [name] has bruising to her left forearm, abrasions and her right knee and right flank to lateral abdomin.  Katie [name] is having bad pain on her right side 9/10 pain.  Katie [name] has increased pain with a deep breath.   Coughing, sneezing makes this worse.   Denies any shortness of breath, headache, bleeding.  Katie [name] sleeps with 02 at night.  This am Katie [name] was at 90% without supplementation.    Katie [name] reports that Katie [name] has an  incrediable amount of stress right now.  Her daughter is in a coma in [place] and they are not sure Katie [name] will recover from this.  Her husband is traveling back to [place] today.    Past Medical History:  Diagnosis Date  . AAA (abdominal aortic aneurysm) ([place])   . AICD (automatic cardioverter/defibrillator) present 11/10/2017  . Arthritis    "some in my knees" (11/10/2017)  . Atrial fibrillation ([place])   . CHF (congestive heart failure) ([place])   . Chronic bronchitis ([place])   . COPD (chronic obstructive pulmonary disease) ([place])   . Depression   . GERD (gastroesophageal reflux disease)   . Gout    "daily RX" (11/10/2017)  . History of hiatal hernia   . Hyperlipidemia   . Hypertension   . Hypothyroid   . Migraine headache    "hx; none since 1980s" (11/10/2017)  . Osteopenia   . Pneumonia    "~ 3 times" (11/10/2017)  . PVD (peripheral vascular disease) ([facility])      Allergies  Allergen Reactions  . Amiodarone Other (See Comments)    PULMONARY TOXICITY  . Diovan [Valsartan] Other (See Comments)    HYPOTENSION  . Doxycycline Diarrhea and Other (See Comments)    VISUAL DISTURBANCE  . Flexeril [Cyclobenzaprine] Other (See Comments)    FATIGUE  . Keflex [Cephalexin] Diarrhea  . Verapamil Other (See Comments)    EDEMA  . Codeine Hives    Current Outpatient Medications on File Prior to Visit  Medication Sig  . acetaminophen-codeine (TYLENOL #3) 300-30 MG tablet Take 1-2 tablets by mouth every 8 (eight) hours  as needed for moderate pain.  Marland Kitchen albuterol (PROAIR HFA) 108 (90 Base) MCG/ACT inhaler Use 2 Inhalations 15 minutes Apart every 4 hours to Rescue Asthma Attack  . allopurinol (ZYLOPRIM) 300 MG tablet TAKE 1 TABLET BY MOUTH EVERY DAY  . amitriptyline (ELAVIL) 25 MG tablet Take 1 tablet 4 x /day for Neuropathy Pains  . aspirin EC 81 MG tablet Take 81 mg by mouth daily.  . Budeson-Glycopyrrol-Formoterol (BREZTRI AEROSPHERE IN) Inhale into the lungs 2 (two) times daily.  . cetirizine  (ZYRTEC) 10 MG tablet Take 10 mg by mouth daily as needed for allergies.   Marland Kitchen digoxin (LANOXIN) 0.125 MG tablet Take 1 tablet (0.125 mg total) by mouth daily.  . Eyelid Cleansers (AVENOVA) 0.01 % SOLN Apply 1 application topically See admin instructions. Wash eyelids twice daily with cleaner  . gabapentin (NEURONTIN) 600 MG tablet TAKE 1 TABLET BY MOUTH THREE TIMES A DAY FOR PAIN  . guaiFENesin (MUCINEX) 600 MG 12 hr tablet Take 600 mg by mouth daily as needed for cough or to loosen phlegm.   Marland Kitchen ipratropium (ATROVENT) 0.06 % nasal spray Use 1 to 2 sprays each Nostril 2 to 3 x / day as needed  . ipratropium-albuterol (DUONEB) 0.5-2.5 (3) MG/3ML SOLN Inhale 3 mLs into the lungs every 6 (six) hours as needed (for shortness of breath or wheezing).  Marland Kitchen leflunomide (ARAVA) 10 MG tablet Take 10 mg by mouth daily.   Marland Kitchen levothyroxine (SYNTHROID) 100 MCG tablet Take 1 tablet daily on an empty stomach with only water for 30 minutes & no Antacid meds, Calcium or Magnesium for 4 hours & avoid Biotin  . montelukast (SINGULAIR) 10 MG tablet TAKE 1 TABLET DAILY FOR ALLERGIES & ASTHMA  . Olopatadine HCl 0.2 % SOLN   . pantoprazole (PROTONIX) 40 MG tablet Take 1 tablet (40 mg total) by mouth daily.  [name] Glycol-Propyl Glycol (SYSTANE) 0.4-0.3 % SOLN Place 1 drop into both eyes 4 (four) times daily as needed (for dry eyes).   . potassium chloride (KLOR-CON) 20 MEQ packet Take 20 mEq by mouth daily.  . pravastatin (PRAVACHOL) 20 MG tablet Take 1 tablet Daily for Cholesterol  . prednisoLONE acetate (PRED FORTE) 1 % ophthalmic suspension Place 1 drop into both eyes 2 (two) times daily.   . Rivaroxaban (XARELTO) 15 MG TABS tablet Take 1 tablet Daily for Afib &  to Prevent Blood Clots  . torsemide (DEMADEX) 20 MG tablet Take 2 tablets (40 mg total) by mouth 2 (two) times daily.  . traMADol (ULTRAM) 50 MG tablet Take 1 tablet (50 mg total) by mouth every 6 (six) hours as needed.  . umeclidinium-vilanterol (ANORO  ELLIPTA) 62.5-25 MCG/INH AEPB Inhale 1 puff into the lungs daily.  . predniSONE (DELTASONE) 20 MG tablet 2 tablets daily for 3 days, 1 tablet daily for 4 days.   No current facility-administered medications on file prior to visit.    ROS: all negative except above.   Physical Exam:  BP 138/72   Pulse 70   Temp (!) 97.5 F (36.4 C)   Ht 5\' 3"  (1.6 m)   Wt 185 lb (83.9 kg)   SpO2 92%   BMI 32.77 kg/m   General Appearance: Well nourished, in no apparent distress. Eyes: PERRLA, EOMs, conjunctiva no swelling or erythema Sinuses: No Frontal/maxillary tenderness ENT/Mouth: Ext aud canals clear, TMs without erythema, bulging. No erythema, swelling, or exudate on post pharynx.  Tonsils not swollen or erythematous. Hearing normal.  Neck: Supple, thyroid normal.  Respiratory: Respiratory effort normal, BS equal bilaterally without rales, rhonchi, wheezing or stridor.  Cardio: RRR with no MRGs. Brisk peripheral pulses without edema.  Abdomen: Soft, + BS.  Non tender, no guarding, rebound, hernias, masses. Lymphatics: Non tender without lymphadenopathy.  Musculoskeletal: Full ROM, 5/5 strength, normal gait.  Skin: Warm, dry without rashes, lesions, ecchymosis noted to . Right lateral ribs and flank, abrasions noted. Neuro: Cranial nerves intact. Normal muscle tone, no cerebellar symptoms. Sensation intact.  Psych: Awake and oriented X 3, normal affect, Insight and Judgment appropriate.     Garnet Sierras, NP 2:56 PM [facility]

## 2019-08-12 ENCOUNTER — Inpatient Hospital Stay (HOSPITAL_COMMUNITY)
Admission: EM | Admit: 2019-08-12 | Discharge: 2019-08-18 | DRG: 682 | Disposition: A | Discharge status: Skilled Nursing Facility | Payer: Medicare Other | Attending: Internal Medicine | Admitting: Internal Medicine

## 2019-08-12 ENCOUNTER — Inpatient Hospital Stay (HOSPITAL_COMMUNITY): Payer: Medicare Other

## 2019-08-12 ENCOUNTER — Emergency Department (HOSPITAL_COMMUNITY): Payer: Medicare Other

## 2019-08-12 ENCOUNTER — Other Ambulatory Visit: Payer: Self-pay

## 2019-08-12 ENCOUNTER — Encounter (HOSPITAL_COMMUNITY): Payer: Self-pay | Admitting: Emergency Medicine

## 2019-08-12 DIAGNOSIS — Z823 Family history of stroke: Secondary | ICD-10-CM

## 2019-08-12 DIAGNOSIS — J45902 Unspecified asthma with status asthmaticus: Secondary | ICD-10-CM | POA: Diagnosis not present

## 2019-08-12 DIAGNOSIS — M8580 Other specified disorders of bone density and structure, unspecified site: Secondary | ICD-10-CM | POA: Diagnosis present

## 2019-08-12 DIAGNOSIS — I4891 Unspecified atrial fibrillation: Secondary | ICD-10-CM | POA: Diagnosis not present

## 2019-08-12 DIAGNOSIS — G43909 Migraine, unspecified, not intractable, without status migrainosus: Secondary | ICD-10-CM | POA: Diagnosis not present

## 2019-08-12 DIAGNOSIS — I130 Hypertensive heart and chronic kidney disease with heart failure and stage 1 through stage 4 chronic kidney disease, or unspecified chronic kidney disease: Secondary | ICD-10-CM | POA: Diagnosis present

## 2019-08-12 DIAGNOSIS — Z7401 Bed confinement status: Secondary | ICD-10-CM | POA: Diagnosis not present

## 2019-08-12 DIAGNOSIS — I350 Nonrheumatic aortic (valve) stenosis: Secondary | ICD-10-CM | POA: Diagnosis not present

## 2019-08-12 DIAGNOSIS — Z818 Family history of other mental and behavioral disorders: Secondary | ICD-10-CM

## 2019-08-12 DIAGNOSIS — N170 Acute kidney failure with tubular necrosis: Secondary | ICD-10-CM | POA: Diagnosis not present

## 2019-08-12 DIAGNOSIS — K7689 Other specified diseases of liver: Secondary | ICD-10-CM | POA: Diagnosis not present

## 2019-08-12 DIAGNOSIS — R5381 Other malaise: Secondary | ICD-10-CM | POA: Diagnosis not present

## 2019-08-12 DIAGNOSIS — K219 Gastro-esophageal reflux disease without esophagitis: Secondary | ICD-10-CM | POA: Diagnosis present

## 2019-08-12 DIAGNOSIS — Z7952 Long term (current) use of systemic steroids: Secondary | ICD-10-CM

## 2019-08-12 DIAGNOSIS — Z8679 Personal history of other diseases of the circulatory system: Secondary | ICD-10-CM

## 2019-08-12 DIAGNOSIS — M25561 Pain in right knee: Secondary | ICD-10-CM | POA: Diagnosis not present

## 2019-08-12 DIAGNOSIS — I129 Hypertensive chronic kidney disease with stage 1 through stage 4 chronic kidney disease, or unspecified chronic kidney disease: Secondary | ICD-10-CM | POA: Diagnosis not present

## 2019-08-12 DIAGNOSIS — E785 Hyperlipidemia, unspecified: Secondary | ICD-10-CM | POA: Diagnosis not present

## 2019-08-12 DIAGNOSIS — I482 Chronic atrial fibrillation, unspecified: Secondary | ICD-10-CM | POA: Diagnosis not present

## 2019-08-12 DIAGNOSIS — S37009D Unspecified injury of unspecified kidney, subsequent encounter: Secondary | ICD-10-CM | POA: Diagnosis not present

## 2019-08-12 DIAGNOSIS — Z20822 Contact with and (suspected) exposure to covid-19: Secondary | ICD-10-CM | POA: Diagnosis present

## 2019-08-12 DIAGNOSIS — I5042 Chronic combined systolic (congestive) and diastolic (congestive) heart failure: Secondary | ICD-10-CM | POA: Diagnosis present

## 2019-08-12 DIAGNOSIS — Z9049 Acquired absence of other specified parts of digestive tract: Secondary | ICD-10-CM

## 2019-08-12 DIAGNOSIS — N179 Acute kidney failure, unspecified: Principal | ICD-10-CM | POA: Diagnosis present

## 2019-08-12 DIAGNOSIS — G4700 Insomnia, unspecified: Secondary | ICD-10-CM | POA: Diagnosis not present

## 2019-08-12 DIAGNOSIS — G629 Polyneuropathy, unspecified: Secondary | ICD-10-CM | POA: Diagnosis present

## 2019-08-12 DIAGNOSIS — R52 Pain, unspecified: Secondary | ICD-10-CM

## 2019-08-12 DIAGNOSIS — S0990XA Unspecified injury of head, initial encounter: Secondary | ICD-10-CM | POA: Diagnosis not present

## 2019-08-12 DIAGNOSIS — R109 Unspecified abdominal pain: Secondary | ICD-10-CM

## 2019-08-12 DIAGNOSIS — G894 Chronic pain syndrome: Secondary | ICD-10-CM | POA: Diagnosis not present

## 2019-08-12 DIAGNOSIS — Z811 Family history of alcohol abuse and dependence: Secondary | ICD-10-CM

## 2019-08-12 DIAGNOSIS — Z79890 Hormone replacement therapy (postmenopausal): Secondary | ICD-10-CM

## 2019-08-12 DIAGNOSIS — E039 Hypothyroidism, unspecified: Secondary | ICD-10-CM | POA: Diagnosis present

## 2019-08-12 DIAGNOSIS — I739 Peripheral vascular disease, unspecified: Secondary | ICD-10-CM | POA: Diagnosis present

## 2019-08-12 DIAGNOSIS — F329 Major depressive disorder, single episode, unspecified: Secondary | ICD-10-CM | POA: Diagnosis present

## 2019-08-12 DIAGNOSIS — M6281 Muscle weakness (generalized): Secondary | ICD-10-CM | POA: Diagnosis not present

## 2019-08-12 DIAGNOSIS — R296 Repeated falls: Secondary | ICD-10-CM

## 2019-08-12 DIAGNOSIS — M109 Gout, unspecified: Secondary | ICD-10-CM | POA: Diagnosis present

## 2019-08-12 DIAGNOSIS — I428 Other cardiomyopathies: Secondary | ICD-10-CM | POA: Diagnosis present

## 2019-08-12 DIAGNOSIS — Z7982 Long term (current) use of aspirin: Secondary | ICD-10-CM

## 2019-08-12 DIAGNOSIS — G9341 Metabolic encephalopathy: Secondary | ICD-10-CM | POA: Diagnosis not present

## 2019-08-12 DIAGNOSIS — J449 Chronic obstructive pulmonary disease, unspecified: Secondary | ICD-10-CM | POA: Diagnosis not present

## 2019-08-12 DIAGNOSIS — T460X1A Poisoning by cardiac-stimulant glycosides and drugs of similar action, accidental (unintentional), initial encounter: Secondary | ICD-10-CM | POA: Diagnosis not present

## 2019-08-12 DIAGNOSIS — N183 Chronic kidney disease, stage 3 unspecified: Secondary | ICD-10-CM | POA: Diagnosis not present

## 2019-08-12 DIAGNOSIS — K760 Fatty (change of) liver, not elsewhere classified: Secondary | ICD-10-CM | POA: Diagnosis present

## 2019-08-12 DIAGNOSIS — M069 Rheumatoid arthritis, unspecified: Secondary | ICD-10-CM | POA: Diagnosis present

## 2019-08-12 DIAGNOSIS — E871 Hypo-osmolality and hyponatremia: Secondary | ICD-10-CM | POA: Diagnosis present

## 2019-08-12 DIAGNOSIS — I361 Nonrheumatic tricuspid (valve) insufficiency: Secondary | ICD-10-CM | POA: Diagnosis not present

## 2019-08-12 DIAGNOSIS — Z743 Need for continuous supervision: Secondary | ICD-10-CM | POA: Diagnosis not present

## 2019-08-12 DIAGNOSIS — Z87891 Personal history of nicotine dependence: Secondary | ICD-10-CM

## 2019-08-12 DIAGNOSIS — Z79899 Other long term (current) drug therapy: Secondary | ICD-10-CM

## 2019-08-12 DIAGNOSIS — I351 Nonrheumatic aortic (valve) insufficiency: Secondary | ICD-10-CM | POA: Diagnosis not present

## 2019-08-12 DIAGNOSIS — M0680 Other specified rheumatoid arthritis, unspecified site: Secondary | ICD-10-CM | POA: Diagnosis not present

## 2019-08-12 DIAGNOSIS — N1832 Chronic kidney disease, stage 3b: Secondary | ICD-10-CM | POA: Diagnosis not present

## 2019-08-12 DIAGNOSIS — Z7901 Long term (current) use of anticoagulants: Secondary | ICD-10-CM

## 2019-08-12 DIAGNOSIS — Z8249 Family history of ischemic heart disease and other diseases of the circulatory system: Secondary | ICD-10-CM

## 2019-08-12 DIAGNOSIS — S8991XA Unspecified injury of right lower leg, initial encounter: Secondary | ICD-10-CM | POA: Diagnosis not present

## 2019-08-12 DIAGNOSIS — R2681 Unsteadiness on feet: Secondary | ICD-10-CM | POA: Diagnosis not present

## 2019-08-12 DIAGNOSIS — Z7951 Long term (current) use of inhaled steroids: Secondary | ICD-10-CM

## 2019-08-12 DIAGNOSIS — Z803 Family history of malignant neoplasm of breast: Secondary | ICD-10-CM

## 2019-08-12 DIAGNOSIS — I509 Heart failure, unspecified: Secondary | ICD-10-CM | POA: Diagnosis not present

## 2019-08-12 DIAGNOSIS — I10 Essential (primary) hypertension: Secondary | ICD-10-CM | POA: Diagnosis not present

## 2019-08-12 DIAGNOSIS — R531 Weakness: Secondary | ICD-10-CM | POA: Diagnosis not present

## 2019-08-12 DIAGNOSIS — M25562 Pain in left knee: Secondary | ICD-10-CM | POA: Diagnosis not present

## 2019-08-12 DIAGNOSIS — I4811 Longstanding persistent atrial fibrillation: Secondary | ICD-10-CM | POA: Diagnosis not present

## 2019-08-12 DIAGNOSIS — G9340 Encephalopathy, unspecified: Secondary | ICD-10-CM | POA: Diagnosis present

## 2019-08-12 DIAGNOSIS — S37009A Unspecified injury of unspecified kidney, initial encounter: Secondary | ICD-10-CM

## 2019-08-12 DIAGNOSIS — M2550 Pain in unspecified joint: Secondary | ICD-10-CM | POA: Diagnosis not present

## 2019-08-12 DIAGNOSIS — S3993XA Unspecified injury of pelvis, initial encounter: Secondary | ICD-10-CM | POA: Diagnosis not present

## 2019-08-12 DIAGNOSIS — Z95810 Presence of automatic (implantable) cardiac defibrillator: Secondary | ICD-10-CM

## 2019-08-12 LAB — COMPREHENSIVE METABOLIC PANEL
ALT: 19 U/L (ref 0–44)
AST: 27 U/L (ref 15–41)
Albumin: 3.2 g/dL — ABNORMAL LOW (ref 3.5–5.0)
Alkaline Phosphatase: 59 U/L (ref 38–126)
Anion gap: 19 — ABNORMAL HIGH (ref 5–15)
BUN: 46 mg/dL — ABNORMAL HIGH (ref 8–23)
CO2: 21 mmol/L — ABNORMAL LOW (ref 22–32)
Calcium: 9.1 mg/dL (ref 8.9–10.3)
Chloride: 92 mmol/L — ABNORMAL LOW (ref 98–111)
Creatinine, Ser: 2.78 mg/dL — ABNORMAL HIGH (ref 0.44–1.00)
GFR calc Af Amer: 18 mL/min — ABNORMAL LOW (ref >60)
GFR calc non Af Amer: 16 mL/min — ABNORMAL LOW (ref >60)
Glucose, Bld: 75 mg/dL (ref 70–99)
Potassium: 4.5 mmol/L (ref 3.5–5.1)
Sodium: 132 mmol/L — ABNORMAL LOW (ref 135–145)
Total Bilirubin: 2.5 mg/dL — ABNORMAL HIGH (ref 0.3–1.2)
Total Protein: 6.8 g/dL (ref 6.5–8.1)

## 2019-08-12 LAB — PROCALCITONIN: Procalcitonin: 1.28 ng/mL

## 2019-08-12 LAB — URINALYSIS, ROUTINE W REFLEX MICROSCOPIC
Bilirubin Urine: NEGATIVE
Glucose, UA: NEGATIVE mg/dL
Hgb urine dipstick: NEGATIVE
Ketones, ur: NEGATIVE mg/dL
Leukocytes,Ua: NEGATIVE
Nitrite: NEGATIVE
Protein, ur: NEGATIVE mg/dL
Specific Gravity, Urine: 1.009 (ref 1.005–1.030)
pH: 5 (ref 5.0–8.0)

## 2019-08-12 LAB — BRAIN NATRIURETIC PEPTIDE: B Natriuretic Peptide: 518.4 pg/mL — ABNORMAL HIGH (ref 0.0–100.0)

## 2019-08-12 LAB — CBC
HCT: 37.2 % (ref 36.0–46.0)
Hemoglobin: 11.6 g/dL — ABNORMAL LOW (ref 12.0–15.0)
MCH: 28.7 pg (ref 26.0–34.0)
MCHC: 31.2 g/dL (ref 30.0–36.0)
MCV: 92.1 fL (ref 80.0–100.0)
Platelets: 258 10*3/uL (ref 150–400)
RBC: 4.04 MIL/uL (ref 3.87–5.11)
RDW: 16.9 % — ABNORMAL HIGH (ref 11.5–15.5)
WBC: 20.3 10*3/uL — ABNORMAL HIGH (ref 4.0–10.5)
nRBC: 0.1 % (ref 0.0–0.2)

## 2019-08-12 LAB — SARS CORONAVIRUS 2 BY RT PCR (HOSPITAL ORDER, PERFORMED IN ~~LOC~~ HOSPITAL LAB): SARS Coronavirus 2: NEGATIVE

## 2019-08-12 LAB — DIGOXIN LEVEL: Digoxin Level: 2.8 ng/mL (ref 0.8–2.0)

## 2019-08-12 LAB — TROPONIN I (HIGH SENSITIVITY): Troponin I (High Sensitivity): 90 ng/L — ABNORMAL HIGH (ref <18)

## 2019-08-12 LAB — TSH: TSH: 1.206 u[IU]/mL (ref 0.350–4.500)

## 2019-08-12 MED ORDER — MOMETASONE FURO-FORMOTEROL FUM 200-5 MCG/ACT IN AERO
2.0000 | INHALATION_SPRAY | Freq: Two times a day (BID) | RESPIRATORY_TRACT | Status: DC
  Filled 2019-08-12: qty 8.8

## 2019-08-12 MED ORDER — RIVAROXABAN 15 MG PO TABS
15.0000 mg | ORAL_TABLET | Freq: Every day | ORAL | Status: DC
  Administered 2019-08-13 – 2019-08-17 (×5): 15 mg via ORAL
  Filled 2019-08-12 (×5): qty 1

## 2019-08-12 MED ORDER — ENOXAPARIN SODIUM 40 MG/0.4ML ~~LOC~~ SOLN
40.0000 mg | SUBCUTANEOUS | Status: DC
Start: 2019-08-12 — End: 2019-08-12

## 2019-08-12 MED ORDER — SODIUM CHLORIDE 0.9 % IV BOLUS
1000.0000 mL | Freq: Once | INTRAVENOUS | Status: AC
  Administered 2019-08-12: 1000 mL via INTRAVENOUS

## 2019-08-12 MED ORDER — ACETAMINOPHEN 325 MG PO TABS
650.0000 mg | ORAL_TABLET | Freq: Four times a day (QID) | ORAL | Status: DC | PRN
  Administered 2019-08-12 – 2019-08-18 (×11): 650 mg via ORAL
  Filled 2019-08-12 (×11): qty 2

## 2019-08-12 MED ORDER — MONTELUKAST SODIUM 10 MG PO TABS
10.0000 mg | ORAL_TABLET | Freq: Every day | ORAL | Status: DC
  Administered 2019-08-13 – 2019-08-18 (×6): 10 mg via ORAL
  Filled 2019-08-12 (×6): qty 1

## 2019-08-12 MED ORDER — SODIUM CHLORIDE 0.9% FLUSH
3.0000 mL | Freq: Once | INTRAVENOUS | Status: AC
  Administered 2019-08-13: 3 mL via INTRAVENOUS

## 2019-08-12 MED ORDER — LEVOTHYROXINE SODIUM 100 MCG PO TABS
100.0000 ug | ORAL_TABLET | Freq: Every day | ORAL | Status: DC
  Administered 2019-08-13 – 2019-08-16 (×4): 100 ug via ORAL
  Filled 2019-08-12 (×4): qty 1

## 2019-08-12 MED ORDER — ALBUTEROL SULFATE (2.5 MG/3ML) 0.083% IN NEBU: 3.0000 mL | INHALATION_SOLUTION | RESPIRATORY_TRACT | Status: DC

## 2019-08-12 MED ORDER — LACTATED RINGERS IV SOLN: INTRAVENOUS | Status: DC

## 2019-08-12 MED ORDER — ACETAMINOPHEN 650 MG RE SUPP
650.0000 mg | Freq: Four times a day (QID) | RECTAL | Status: DC | PRN
  Filled 2019-08-12: qty 1

## 2019-08-12 MED ORDER — PRAVASTATIN SODIUM 10 MG PO TABS
20.0000 mg | ORAL_TABLET | Freq: Every day | ORAL | Status: DC
  Administered 2019-08-12 – 2019-08-17 (×6): 20 mg via ORAL
  Filled 2019-08-12 (×6): qty 2

## 2019-08-12 MED ORDER — ASPIRIN EC 81 MG PO TBEC
81.0000 mg | DELAYED_RELEASE_TABLET | Freq: Every day | ORAL | Status: DC
  Administered 2019-08-13 – 2019-08-18 (×6): 81 mg via ORAL
  Filled 2019-08-12 (×6): qty 1

## 2019-08-12 NOTE — TOC Initial Note (Signed)
Transition of Care [facility]) - Initial/Assessment Note    Patient Details  Name: Katie [name] MRN: [id number] Date of Birth: 03-15-1941  [facility] CM/SW Contact:    Verdell Carmine, RN Phone Number: 08/12/2019, 3:56 PM  Clinical Narrative:                 Admitted to the ED with recent fall, [place] last week also, now with increased confusion, creatinine elevation, bright sided pain from fall and confusion. Patients daughter at bedside., She lives nearby and checks on them, as well as their niece. Patient knows her  name, states the year is 81.  Does not know the time States Barbette Or is the Software engineer.  .  Daughter found that her pills boxes at home were filled with candy and buttons.  [name]'s husband is going over to the house now to take pictures of the medicine bottles so we can see what she is proscribed currently, as patient cannot articulate her medications or doses.  She cannot void, so she had to be in and out cathed. Cannot rule out over or under dosing of medications, as well as potential UTI causing confusion. Has been on prednisone int he past, is currently not on it now.  Plan: potentially admitting to hospital . If she does go home will need supervision, and home health as she does now have several wounds from falling on blood thinners, and a noted left bunion area small ulceration that needs to be followed.  Would recommend home or OP PT /OT for strengthening  Will need MD orders if she discharges. ,  Expected Discharge Plan: [place] Barriers to Discharge: Continued Medical Work up   Patient Goals and CMS Choice        Expected Discharge Plan and Services Expected Discharge Plan: [place]   Discharge Planning Services: CM Consult                                          Prior Living Arrangements/Services   Lives with:: Spouse Patient language and need for interpreter reviewed:: Yes Do you feel safe going back  to the place where you live?: Yes      Need for Family Participation in Patient Care: Yes (Comment) Care giver support system in place?: Yes (comment)   Criminal Activity/Legal Involvement Pertinent to Current Situation/Hospitalization: No - Comment as needed  Activities of Daily Living      Permission Sought/Granted      Share Information with NAME: [name]- Daughter           Emotional Assessment Appearance:: Appears stated age Attitude/Demeanor/Rapport: Complaining(confused) Affect (typically observed): Blunt Orientation: : Oriented to Self   Psych Involvement: No (comment)  Admission diagnosis:  Fall, Confusion Patient Active Problem List   Diagnosis Date Noted  . Senile purpura ([place]) 05/30/2019  . Rheumatoid arthritis involving multiple sites ([place]) 05/30/2019  . Obesity (BMI 30.0-34.9) 05/30/2019  . Mononeuropathy 11/23/2018  . 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 COPD (chronic obstructive pulmonary disease) with chronic bronchitis ([place]) 02/02/2018  . Aortic atherosclerosis ([place]) 10/17/2017  . PAH (pulmonary artery hypertension) ([place]) 06/23/2017  . Gout 03/20/2017  . Abnormal glucose 09/19/2015  . AAA (abdominal aortic aneurysm) without rupture ([place]) 04/22/2015  . PVD (peripheral vascular disease) with claudication ([place]) 10/16/2013  . CKD (chronic kidney disease) stage  3, GFR 30-59 ml/min 06/08/2013  . Hyperlipidemia, mixed 06/08/2013  . 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 Long term current use of anticoagulant therapy 04/23/2013  . Vitamin D deficiency 02/15/2013  . Hypothyroidism   . Osteopenia   . Chronic combined systolic and diastolic CHF (congestive heart failure) ([place]) 11/25/2008  . Essential hypertension 11/22/2008  . Atherosclerosis of native coronary artery of native heart without angina pectoris 11/22/2008  . Atrial fibrillation ([place]) 11/22/2008  . Chronic obstructive pulmonary disease  with hypoxia ([place]) 11/22/2008  . Gastroesophageal reflux disease without esophagitis 11/22/2008   PCP:  [name], MD Pharmacy:   [id number] - , [address] [place] [address] Phone: [phone] Fax: [facility] [address] [place] [address] [place] [address] Phone: [phone] Fax: [phone]     Social Determinants of Health (SDOH) Interventions    Readmission Risk Interventions No flowsheet data found.

## 2019-08-12 NOTE — ED Notes (Signed)
Attempted to call report, no answer on unit. 

## 2019-08-12 NOTE — ED Provider Notes (Signed)
[facility] EMERGENCY DEPARTMENT Provider Note   CSN: [id number] Arrival date & time: 08/12/19  1309     History Chief Complaint  Patient presents with  . Altered Mental Status  . Fall    Katie [name] is a 79 y.o. female.  Patient noted to have two recent falls, one Tuesday on back steps, and another last night in bathroom. Patient has a walker but doesn't use. Daughter indicates patients gait is slow, shuffling, unsteady. Pt denies loc with fall. Family was able to assist back up. Patient denies headache. +is on anticoag therapy. Family also notes waxing and waning confusion in the past few days. Symptoms acute onset, moderate, waxing and waning. Patient denies other new symptoms. No headache. No chest pain or discomfort. No sob. No cough or uri symptoms. No abd pain or nvd. No dysuria or gu c/o. Pt denies change in meds. Family questions whether patient may have taken extra of her meds, possible her acetaminophen w codeine, although pt denies. No change in speech or vision. No numbness or weakness.   The history is provided by the patient.  Altered Mental Status Presenting symptoms: confusion   Associated symptoms: no abdominal pain, no fever, no headaches, no nausea, no rash, no vomiting and no weakness   Fall Pertinent negatives include no chest pain, no abdominal pain, no headaches and no shortness of breath.       Past Medical History:  Diagnosis Date  . AAA (abdominal aortic aneurysm) ([place])   . AICD (automatic cardioverter/defibrillator) present 11/10/2017  . Arthritis    "some in my knees" (11/10/2017)  . Atrial fibrillation ([place])   . CHF (congestive heart failure) ([place])   . Chronic bronchitis ([place])   . COPD (chronic obstructive pulmonary disease) ([place])   . Depression   . GERD (gastroesophageal reflux disease)   . Gout    "daily RX" (11/10/2017)  . History of hiatal hernia   . Hyperlipidemia   . Hypertension   . Hypothyroid   . Migraine headache     "hx; none since 1980s" (11/10/2017)  . Osteopenia   . Pneumonia    "~ 3 times" (11/10/2017)  . PVD (peripheral vascular disease) [facility])     Patient Active Problem List   Diagnosis Date Noted  . Senile purpura ([place]) 05/30/2019  . Rheumatoid arthritis involving multiple sites ([place]) 05/30/2019  . Obesity (BMI 30.0-34.9) 05/30/2019  . Mononeuropathy 11/23/2018  . 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 COPD (chronic obstructive pulmonary disease) with chronic bronchitis ([place]) 02/02/2018  . Aortic atherosclerosis ([place]) 10/17/2017  . PAH (pulmonary artery hypertension) ([place]) 06/23/2017  . Gout 03/20/2017  . Abnormal glucose 09/19/2015  . AAA (abdominal aortic aneurysm) without rupture ([place]) 04/22/2015  . PVD (peripheral vascular disease) with claudication ([place]) 10/16/2013  . CKD (chronic kidney disease) stage 3, GFR 30-59 ml/min 06/08/2013  . Hyperlipidemia, mixed 06/08/2013  . 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 Long term current use of anticoagulant therapy 04/23/2013  . Vitamin D deficiency 02/15/2013  . Hypothyroidism   . Osteopenia   . Chronic combined systolic and diastolic CHF (congestive heart failure) ([place]) 11/25/2008  . Essential hypertension 11/22/2008  . Atherosclerosis of native coronary artery of native heart without angina pectoris 11/22/2008  . Atrial fibrillation ([place]) 11/22/2008  . Chronic obstructive pulmonary disease with hypoxia ([place]) 11/22/2008  . Gastroesophageal reflux disease without esophagitis 11/22/2008    Past Surgical History:  Procedure Laterality Date  . ABDOMINAL AORTIC ENDOVASCULAR  STENT GRAFT N/A 04/11/2013   Procedure: ABDOMINAL AORTIC ENDOVASCULAR STENT GRAFT WITH RIGHT FEMORAL PATCH ANGIOPLASTY;  Surgeon: Mal Misty, MD;  Location: [facility];  Service: Vascular;  Laterality: N/A;  . AV NODE ABLATION N/A 09/13/2018   Procedure: AV NODE ABLATION;  Surgeon: Evans Lance, MD;  Location:  [facility];  Service: Cardiovascular;  Laterality: N/A;  . BIV ICD INSERTION CRT-D  11/10/2017  . BIV ICD INSERTION CRT-D N/A 11/10/2017   Procedure: BIV ICD INSERTION CRT-D;  Surgeon: Evans Lance, MD;  Location: [facility];  Service: Cardiovascular;  Laterality: N/A;  . BREAST SURGERY     LEFT BREAST BIOPSY  . CARDIAC CATHETERIZATION    . CATARACT EXTRACTION W/ INTRAOCULAR LENS  IMPLANT, BILATERAL Bilateral   . CHOLECYSTECTOMY OPEN  1972  . COLONOSCOPY    . EYE SURGERY Bilateral    "bleeding in my eyes"  . FRACTURE SURGERY    . TIBIA FRACTURE SURGERY Left 1970s  . TONSILLECTOMY    . VARICOSE VEIN SURGERY Bilateral    "laser"     OB History    Gravida  4   Para  3   Term  3   Preterm      AB  1   Living  3     SAB      TAB      Ectopic  0   Multiple      Live Births              Family History  Problem Relation Age of Onset  . Cirrhosis Mother   . Cancer Mother 57       PANCREAS  . Heart defect Sister   . Breast cancer Sister        age 75  . Heart disease Sister   . Stroke Sister   . Alcohol abuse Father   . Depression Father   . Hypertension Brother   . Hyperlipidemia Son   . Heart disease Daughter     Social History   Tobacco Use  . Smoking status: Former Smoker    Packs/day: 2.00    Years: 54.00    Pack years: 108.00    Types: Cigarettes    Quit date: 08/10/2002    Years since quitting: 17.0  . Smokeless tobacco: Never Used  Substance Use Topics  . Alcohol use: Never  . Drug use: Never    Home Medications Prior to Admission medications   Medication Sig Start Date End Date Taking? Authorizing Provider  albuterol (PROAIR HFA) 108 (90 Base) MCG/ACT inhaler Use 2 Inhalations 15 minutes Apart every 4 hours to Rescue Asthma Attack 07/29/18   [name], MD  allopurinol (ZYLOPRIM) 300 MG tablet TAKE 1 TABLET BY MOUTH EVERY DAY 04/16/19   Vicie Mutters, PA-C  amitriptyline (ELAVIL) 25 MG tablet Take 1 tablet 4 x  /day for Neuropathy [place] 03/21/19   [name], MD  aspirin EC 81 MG tablet Take 81 mg by mouth daily.    [provider]  Budeson-Glycopyrrol-Formoterol (BREZTRI AEROSPHERE IN) Inhale into the lungs 2 (two) times daily.    [provider]  cetirizine (ZYRTEC) 10 MG tablet Take 10 mg by mouth daily as needed for allergies.     [provider]  digoxin (LANOXIN) 0.125 MG tablet Take 1 tablet (0.125 mg total) by mouth daily. 01/05/18   Garnet Sierras, NP  Eyelid Cleansers (AVENOVA) 0.01 % SOLN Apply 1 application topically See  admin instructions. Wash eyelids twice daily with cleaner 11/28/17   [provider]  gabapentin (NEURONTIN) 600 MG tablet TAKE 1 TABLET BY MOUTH THREE TIMES A DAY FOR PAIN 04/30/19   Liane Comber, NP  guaiFENesin (MUCINEX) 600 MG 12 hr tablet Take 600 mg by mouth daily as needed for cough or to loosen phlegm.     [provider]  ipratropium (ATROVENT) 0.06 % nasal spray Use 1 to 2 sprays each Nostril 2 to 3 x / day as needed 02/27/19 02/28/20  [name], MD  ipratropium-albuterol (DUONEB) 0.5-2.5 (3) MG/3ML SOLN Inhale 3 mLs into the lungs every 6 (six) hours as needed (for shortness of breath or wheezing). 02/27/19   [name], MD  leflunomide (ARAVA) 10 MG tablet Take 10 mg by mouth daily.  08/20/18   [provider]  levothyroxine (SYNTHROID) 100 MCG tablet Take 1 tablet daily on an empty stomach with only water for 30 minutes & no Antacid meds, Calcium or Magnesium for 4 hours & avoid Biotin 03/04/19   [name], MD  montelukast (SINGULAIR) 10 MG tablet TAKE 1 TABLET DAILY FOR ALLERGIES & ASTHMA 02/27/19   [name], MD  Olopatadine HCl 0.2 % SOLN  12/18/18   [provider]  pantoprazole (PROTONIX) 40 MG tablet Take 1 tablet (40 mg total) by mouth daily. 07/16/16   Ghimire, Henreitta Leber, MD  Polyethyl Glycol-Propyl Glycol (SYSTANE) 0.4-0.3 % SOLN Place 1 drop into both eyes 4 (four)  times daily as needed (for dry eyes).     [provider]  potassium chloride (KLOR-CON) 20 MEQ packet Take 20 mEq by mouth daily.    [provider]  pravastatin (PRAVACHOL) 20 MG tablet Take 1 tablet Daily for Cholesterol 06/24/19   [name], MD  prednisoLONE acetate (PRED FORTE) 1 % ophthalmic suspension Place 1 drop into both eyes 2 (two) times daily.  04/27/17   [provider]  Rivaroxaban (XARELTO) 15 MG TABS tablet Take 1 tablet Daily for Afib &  to Prevent Blood Clots 06/24/19   [name], MD  torsemide (DEMADEX) 20 MG tablet Take 2 tablets (40 mg total) by mouth 2 (two) times daily. 08/28/18   Evans Lance, MD    Allergies    Amiodarone, Diovan [valsartan], Doxycycline, Flexeril [cyclobenzaprine], Keflex [cephalexin], Verapamil, and Codeine  Review of Systems   Review of Systems  Constitutional: Negative for chills and fever.  HENT: Negative for sore throat.   Eyes: Negative for redness and visual disturbance.  Respiratory: Negative for cough and shortness of breath.   Cardiovascular: Negative for chest pain.  Gastrointestinal: Negative for abdominal pain, nausea and vomiting.  Genitourinary: Negative for dysuria and flank pain.  Musculoskeletal: Negative for back pain and neck pain.  Skin: Negative for rash.  Neurological: Negative for weakness, numbness and headaches.  Hematological:       +anticoag therapy.   Psychiatric/Behavioral: Positive for confusion.    Physical Exam Updated Vital Signs BP (!) 133/58 (BP Location: Right Arm)   Pulse 70   Temp (!) 97.5 F (36.4 C) (Oral)   Resp 18   SpO2 98%   Physical Exam Vitals and nursing note reviewed.  Constitutional:      Appearance: Normal appearance. She is well-developed.  HENT:     Head: Atraumatic.     Nose: Nose normal.     Mouth/Throat:     Mouth: Mucous membranes are moist.  Eyes:     General: No scleral icterus.  Conjunctiva/sclera: Conjunctivae normal.      Pupils: Pupils are equal, round, and reactive to light.  Neck:     Vascular: No carotid bruit.     Trachea: No tracheal deviation.     Comments: Thyroid not grossly enlarged or tender. Trachea midline. Normal rom. No bruits.  Cardiovascular:     Rate and Rhythm: Normal rate and regular rhythm.     Pulses: Normal pulses.     Heart sounds: Normal heart sounds. No murmur. No friction rub. No gallop.   Pulmonary:     Effort: Pulmonary effort is normal. No respiratory distress.     Breath sounds: Normal breath sounds.  Abdominal:     General: Bowel sounds are normal. There is no distension.     Palpations: Abdomen is soft.     Tenderness: There is no abdominal tenderness. There is no guarding.  Genitourinary:    Comments: No cva tenderness.  Musculoskeletal:        General: No swelling or tenderness.     Cervical back: Normal range of motion and neck supple. No rigidity. No muscular tenderness.     Right lower leg: No edema.     Left lower leg: No edema.     Comments: CTLS spine, non tender, aligned, no step off. Good rom bil ext, no focal bony tenderness.   Skin:    General: Skin is warm and dry.     Findings: No rash.  Neurological:     Mental Status: She is alert.     Comments: Alert, speech normal/fluent. No gross aphasia or dysarthria. Pt confused regarding recent events, timing of falls, etc. Motor intact bil. Stre 5/5. Sens grossly intact bil.   Psychiatric:        Mood and Affect: Mood normal.     ED Results / Procedures / Treatments   Labs (all labs ordered are listed, but only abnormal results are displayed) Results for orders placed or performed during the hospital encounter of 08/12/19  Comprehensive metabolic panel  Result Value Ref Range   Sodium 132 (L) 135 - 145 mmol/L   Potassium 4.5 3.5 - 5.1 mmol/L   Chloride 92 (L) 98 - 111 mmol/L   CO2 21 (L) 22 - 32 mmol/L   Glucose, Bld 75 70 - 99 mg/dL   BUN 46 (H) 8 - 23 mg/dL   Creatinine, Ser 2.78 (H) 0.44 - 1.00  mg/dL   Calcium 9.1 8.9 - 10.3 mg/dL   Total Protein 6.8 6.5 - 8.1 g/dL   Albumin 3.2 (L) 3.5 - 5.0 g/dL   AST 27 15 - 41 U/L   ALT 19 0 - 44 U/L   Alkaline Phosphatase 59 38 - 126 U/L   Total Bilirubin 2.5 (H) 0.3 - 1.2 mg/dL   GFR calc non Af Amer 16 (L) >60 mL/min   GFR calc Af Amer 18 (L) >60 mL/min   Anion gap 19 (H) 5 - 15  CBC  Result Value Ref Range   WBC 20.3 (H) 4.0 - 10.5 K/uL   RBC 4.04 3.87 - 5.11 MIL/uL   Hemoglobin 11.6 (L) 12.0 - 15.0 g/dL   HCT 37.2 36.0 - 46.0 %   MCV 92.1 80.0 - 100.0 fL   MCH 28.7 26.0 - 34.0 pg   MCHC 31.2 30.0 - 36.0 g/dL   RDW 16.9 (H) 11.5 - 15.5 %   Platelets 258 150 - 400 K/uL   nRBC 0.1 0.0 - 0.2 %   DG Chest  2 View  Result Date: 08/08/2019 CLINICAL DATA:  Fall.  Right rib pain EXAM: CHEST - 2 VIEW COMPARISON:  05/17/2019 FINDINGS: AICD unchanged in position. Cardiac enlargement without heart failure. Pulmonary scarring right upper lobe unchanged. No acute infiltrate or effusion. Bb marks an area of pain right lower anterior ribs. No displaced rib fractures identified. Abdominal aortic stent graft noted. IMPRESSION: No acute abnormality. Electronically Signed   By: Franchot Gallo M.D.   On: 08/08/2019 08:47   CT Head Wo Contrast  Result Date: 08/12/2019 CLINICAL DATA:  Head trauma.  Unwitnessed fall. EXAM: CT HEAD WITHOUT CONTRAST TECHNIQUE: Contiguous axial images were obtained from the base of the skull through the vertex without intravenous contrast. COMPARISON:  January 12, 2014 FINDINGS: Brain: No subdural, epidural, or subarachnoid hemorrhage. Ventricles and sulci are prominent but stable. Cerebellum, brainstem, and basal cisterns are within normal limits. Chronic white matter changes are noted. No acute cortical ischemia or infarct. No mass effect or midline shift. Vascular: Calcified atherosclerosis in the intracranial carotids. Skull: Normal. Negative for fracture or focal lesion. Sinuses/Orbits: No acute finding. Other: None.  IMPRESSION: 1. No acute intracranial abnormalities identified. Electronically Signed   By: Dorise Bullion III M.D   On: 08/12/2019 14:20    EKG EKG Interpretation  Date/Time:  Sunday Aug 12 2019 13:41:41 EDT Ventricular Rate:  70 PR Interval:    QRS Duration: 138 QT Interval:  400 QTC Calculation: 432 R Axis:   -60 Text Interpretation: Electronic ventricular pacemaker Confirmed by Lajean Saver [phone]) on 08/12/2019 2:39:29 PM   Radiology CT Head Wo Contrast  Result Date: 08/12/2019 CLINICAL DATA:  Head trauma.  Unwitnessed fall. EXAM: CT HEAD WITHOUT CONTRAST TECHNIQUE: Contiguous axial images were obtained from the base of the skull through the vertex without intravenous contrast. COMPARISON:  January 12, 2014 FINDINGS: Brain: No subdural, epidural, or subarachnoid hemorrhage. Ventricles and sulci are prominent but stable. Cerebellum, brainstem, and basal cisterns are within normal limits. Chronic white matter changes are noted. No acute cortical ischemia or infarct. No mass effect or midline shift. Vascular: Calcified atherosclerosis in the intracranial carotids. Skull: Normal. Negative for fracture or focal lesion. Sinuses/Orbits: No acute finding. Other: None. IMPRESSION: 1. No acute intracranial abnormalities identified. Electronically Signed   By: Dorise Bullion III M.D   On: 08/12/2019 14:20    Procedures Procedures (including critical care time)  Medications Ordered in ED Medications  sodium chloride flush (NS) 0.9 % injection 3 mL (has no administration in time range)    ED Course  I have reviewed the triage vital signs and the nursing notes.  Pertinent labs & imaging results that were available during my care of the patient were reviewed by me and considered in my medical decision making (see chart for details).    MDM Rules/Calculators/A&P                      Iv ns. Stat labs. CT.  Reviewed nursing notes and prior charts for additional history.   Initial labs  reviewed/interpreted by me - wbc elev. Pt denies fever. No cough. Abd soft nt. Denies gu c/o. Additional labs pending.  CT reviewed/interpreted by me - no acute hem.  Additional labs reviewed/interpreted by me - AKI, elevated Cr. Ns bolus.   1539, labs, xray pending - signed out to Dr Tyrone Nine to check pending labs, and arrange admission to hospitalists.         Final Clinical Impression(s) / ED Diagnoses Final diagnoses:  None    Rx / DC Orders ED Discharge Orders    None       Lajean Saver, MD 08/12/19 1539

## 2019-08-12 NOTE — ED Triage Notes (Signed)
Daughter states pt had unwitnessed fall down 5 concrete steps on Tuesday.  [place] again last night in bathroom.  Other family members report confusion since falling on Tuesday.  Pt takes Xarelto.    When asked pt what was wrong today she reported she fell while hauling chickens yesterday with her brother-in-law.  Asked her who the visitor was with her today and she said it was her brother-in-law but it was really her daughter.  Daughter has pt's pill organizer that is filled with candy and a bottle cap.  Pt has possibly taken extra medications.  Pt states she hasn't taken anything extra and that she feels fine other than R ankle pain and has been doing housework.

## 2019-08-12 NOTE — ED Notes (Signed)
Dinner Tray Ordered @ 1738. 

## 2019-08-12 NOTE — ED Provider Notes (Signed)
79 yo F with a chief complaints of confusion and frequent falls.  Going on for the past few days.  Brought in by the family she is not eating and drinking today.  Received the patient in signout from Dr. Ashok Cordia.  Plan is for admission post UA and chest x-ray and digoxin level.  Her digoxin level is elevated.  She has an AKI.  White blood cell count 20,000 without obvious source of infection.  Not endorsing any infectious symptoms.  Digoxin level is 2.8.  Interestingly the pharmacy tech had reviewed her medications and it seems that she has not been prescribed digoxin for at least a year.  Discussed with the hospitalist for admission.   Deno Etienne, DO 08/12/19 [phone]

## 2019-08-12 NOTE — H&P (Addendum)
History and Physical    Katie [name] OYD:[id number] DOB: Aug 29, 1940 DOA: 08/12/2019  PCP: [name], MD Consultants:  Cardiology Dr. Cristopher Peru Patient coming from: Home - lives with family   Chief Complaint: Fall  HPI: Katie [name] is a 79 y.o. female with PMH of Migraines, Chronic Atrial Fibrillation, CHF, COPD, and Peripheral Vascular Disease who presents to the ED after experiencing two falls followed by acute confusion.  Patient was walking down the stairs and passed out.   She denies any prodromal symptoms, migraines, nausea, chest pain, shortness of breath, urinary incontinence, and even denies confusion.  Patient and daughter in the room cannot provide me with further details as fall was unwitnessed and Ms. Hansell cannot recall details of event.  She experienced another fall while in the bathroom.  Patient saw her family physician afterwards.  There was no reported changes in speech vision, new onset of weakness, numbness, or tingling.  Patient recently started Amitryptiline but is not on any other new mediations.  She has had no sick contacts, recent travel, or recent infections.  She remains confused.  She is oriented to person and place but believes the year is 69.  In terms of cardiac history: 10/20/2017 Myocardial Perfusion Study: No signs of overt ischemia. 05/23/2018 Echo reviewed.  It showed normal systolic function with EF 55-60%, mild LVH, mild stenosis of aortic valve, RVSP 35.7 mmHg, mild biatrial enlargement with atrial fibrillation.  Review of Systems: As per HPI; otherwise review of systems reviewed and negative.   Ambulatory Status:  Ambulates without assistance  COVID Vaccine Status:  None; First shot; Complete  Past Medical History:  Diagnosis Date  . AAA (abdominal aortic aneurysm) ([place])   . AICD (automatic cardioverter/defibrillator) present 11/10/2017  . Arthritis    "some in my knees" (11/10/2017)  . Atrial fibrillation ([place])   . CHF (congestive heart  failure) ([place])   . Chronic bronchitis ([place])   . COPD (chronic obstructive pulmonary disease) ([place])   . Depression   . GERD (gastroesophageal reflux disease)   . Gout    "daily RX" (11/10/2017)  . History of hiatal hernia   . Hyperlipidemia   . Hypertension   . Hypothyroid   . Migraine headache    "hx; none since 1980s" (11/10/2017)  . Osteopenia   . Pneumonia    "~ 3 times" (11/10/2017)  . PVD (peripheral vascular disease) ([place])     Past Surgical History:  Procedure Laterality Date  . ABDOMINAL AORTIC ENDOVASCULAR STENT GRAFT N/A 04/11/2013   Procedure: ABDOMINAL AORTIC ENDOVASCULAR STENT GRAFT WITH RIGHT FEMORAL PATCH ANGIOPLASTY;  Surgeon: Mal Misty, MD;  Location: [facility];  Service: Vascular;  Laterality: N/A;  . AV NODE ABLATION N/A 09/13/2018   Procedure: AV NODE ABLATION;  Surgeon: Evans Lance, MD;  Location: [facility];  Service: Cardiovascular;  Laterality: N/A;  . BIV ICD INSERTION CRT-D  11/10/2017  . BIV ICD INSERTION CRT-D N/A 11/10/2017   Procedure: BIV ICD INSERTION CRT-D;  Surgeon: Evans Lance, MD;  Location: [facility];  Service: Cardiovascular;  Laterality: N/A;  . BREAST SURGERY     LEFT BREAST BIOPSY  . CARDIAC CATHETERIZATION    . CATARACT EXTRACTION W/ INTRAOCULAR LENS  IMPLANT, BILATERAL Bilateral   . CHOLECYSTECTOMY OPEN  1972  . COLONOSCOPY    . EYE SURGERY Bilateral    "bleeding in my eyes"  . FRACTURE SURGERY    . TIBIA FRACTURE SURGERY Left 1970s  . TONSILLECTOMY    .  VARICOSE VEIN SURGERY Bilateral    "laser"    Social History   Socioeconomic History  . Marital status: Married    Spouse name: Not on file  . Number of children: Not on file  . Years of education: Not on file  . Highest education level: Not on file  Occupational History  . Not on file  Tobacco Use  . Smoking status: Former Smoker    Packs/day: 2.00    Years: 54.00    Pack years: 108.00    Types: Cigarettes    Quit date: 08/10/2002    Years since  quitting: 17.0  . Smokeless tobacco: Never Used  Substance and Sexual Activity  . Alcohol use: Never  . Drug use: Never  . Sexual activity: Not Currently    Birth control/protection: Post-menopausal  Other Topics Concern  . Not on file  Social History Narrative   Lives in [place] with husband   One daughter   Social Determinants of Health   Financial Resource Strain: Low Risk   . Difficulty of Paying Living Expenses: Not hard at all  Food Insecurity: No Food Insecurity  . Worried About Charity fundraiser in the Last Year: Never true  . Ran Out of Food in the Last Year: Never true  Transportation Needs: No Transportation Needs  . Lack of Transportation (Medical): No  . Lack of Transportation (Non-Medical): No  Physical Activity: Inactive  . Days of Exercise per Week: 0 days  . Minutes of Exercise per Session: 0 min  Stress: No Stress Concern Present  . Feeling of Stress : Not at all  Social Connections: Not Isolated  . Frequency of Communication with Friends and Family: More than three times a week  . Frequency of Social Gatherings with Friends and Family: More than three times a week  . Attends Religious Services: More than 4 times per year  . Active Member of Clubs or Organizations: Yes  . Attends Archivist Meetings: More than 4 times per year  . Marital Status: Married  Human resources officer Violence: Not At Risk  . Fear of Current or Ex-Partner: No  . Emotionally Abused: No  . Physically Abused: No  . Sexually Abused: No    Allergies  Allergen Reactions  . Amiodarone Other (See Comments)    PULMONARY TOXICITY  . Diovan [Valsartan] Other (See Comments)    HYPOTENSION  . Doxycycline Diarrhea and Other (See Comments)    VISUAL DISTURBANCE  . Flexeril [Cyclobenzaprine] Other (See Comments)    FATIGUE  . Keflex [Cephalexin] Diarrhea  . Verapamil Other (See Comments)    EDEMA  . Codeine Hives    Family History  Problem Relation Age of Onset  .  Cirrhosis Mother   . Cancer Mother 37       PANCREAS  . Heart defect Sister   . Breast cancer Sister        age 1  . Heart disease Sister   . Stroke Sister   . Alcohol abuse Father   . Depression Father   . Hypertension Brother   . Hyperlipidemia Son   . Heart disease Daughter     Prior to Admission medications   Medication Sig Start Date End Date Taking? Authorizing Provider  acetaminophen-codeine (TYLENOL #3) 300-30 MG tablet Take 1-2 tablets by mouth every 8 (eight) hours as needed for moderate pain.    Yes [provider]  albuterol (PROAIR HFA) 108 (90 Base) MCG/ACT inhaler Use 2 Inhalations 15 minutes  Apart every 4 hours to Rescue Asthma Attack Patient taking differently: Inhale 2 puffs into the lungs See admin instructions. Use 2 inhalations 15 minutes apart every 4 hours as needed for asthma attack 07/29/18  Yes [name], MD  allopurinol (ZYLOPRIM) 300 MG tablet TAKE 1 TABLET BY MOUTH EVERY DAY Patient taking differently: Take 300 mg by mouth daily.  04/16/19  Yes Vicie Mutters, PA-C  amitriptyline (ELAVIL) 25 MG tablet Take 1 tablet 4 x /day for Neuropathy Pains Patient taking differently: Take 25 mg by mouth 4 (four) times daily. for Neuropathy Pains 03/21/19  Yes [name], MD  aspirin EC 81 MG tablet Take 81 mg by mouth daily.   Yes [provider]  Budeson-Glycopyrrol-Formoterol (BREZTRI AEROSPHERE) 160-9-4.8 MCG/ACT AERO Inhale 1 puff into the lungs 2 (two) times daily.   Yes [provider]  budesonide-formoterol (SYMBICORT) 160-4.5 MCG/ACT inhaler Inhale 2 puffs into the lungs 2 (two) times daily.   Yes [provider]  cetirizine (ZYRTEC) 10 MG tablet Take 10 mg by mouth daily as needed for allergies.    Yes [provider]  digoxin (LANOXIN) 0.125 MG tablet Take 1 tablet (0.125 mg total) by mouth daily. 01/05/18  Yes McClanahan, Danton Sewer, NP  Eyelid Cleansers (AVENOVA) 0.01 % SOLN Apply 1 application topically See  admin instructions. Wash eyelids twice daily with cleaner 11/28/17  Yes [provider]  gabapentin (NEURONTIN) 600 MG tablet TAKE 1 TABLET BY MOUTH THREE TIMES A DAY FOR PAIN Patient taking differently: Take 600 mg by mouth 3 (three) times daily. For pain 04/30/19  Yes Corbett, Caryl Pina, NP  Guaifenesin (MUCINEX MAXIMUM STRENGTH) 1200 MG TB12 Take 1,200 mg by mouth 2 (two) times daily as needed (cough/congestion).   Yes [provider]  ipratropium (ATROVENT) 0.06 % nasal spray Use 1 to 2 sprays each Nostril 2 to 3 x / day as needed Patient taking differently: Place 1-2 sprays into both nostrils 3 (three) times daily as needed for rhinitis (congestion).  02/27/19 02/28/20 Yes [name], MD  ipratropium-albuterol (DUONEB) 0.5-2.5 (3) MG/3ML SOLN Inhale 3 mLs into the lungs every 6 (six) hours as needed (for shortness of breath or wheezing). 02/27/19  Yes [name], MD  L-Lysine 500 MG TABS Take 500 mg by mouth daily.   Yes [provider]  leflunomide (ARAVA) 20 MG tablet Take 10 mg by mouth daily.  06/18/19  Yes [provider]  levothyroxine (SYNTHROID) 100 MCG tablet Take 1 tablet daily on an empty stomach with only water for 30 minutes & no Antacid meds, Calcium or Magnesium for 4 hours & avoid Biotin Patient taking differently: Take 100 mcg by mouth daily before breakfast. with only water for 30 minutes & no Antacid meds, Calcium or Magnesium for 4 hours & avoid Biotin 03/04/19  Yes [name], MD  Melatonin 10 MG TABS Take 10 mg by mouth daily as needed (sleep).   Yes [provider]  montelukast (SINGULAIR) 10 MG tablet TAKE 1 TABLET DAILY FOR ALLERGIES & ASTHMA Patient taking differently: Take 10 mg by mouth daily. for Allergies & Asthma 02/27/19  Yes [name], MD  OXYGEN Inhale 2 L into the lungs continuous.   Yes [provider]  potassium chloride SA (KLOR-CON) 20 MEQ tablet Take 20 mEq by mouth daily.    Yes  [provider]  pravastatin (PRAVACHOL) 40 MG tablet Take 20 mg by mouth at bedtime.   Yes [provider]  Rivaroxaban (XARELTO) 15 MG TABS  tablet Take 1 tablet Daily for Afib &  to Prevent Blood Clots Patient taking differently: Take 15 mg by mouth daily. for Afib &  to Prevent Blood Clots 06/24/19  Yes [name], MD  torsemide (DEMADEX) 20 MG tablet Take 2 tablets (40 mg total) by mouth 2 (two) times daily. 08/28/18  Yes Evans Lance, MD  Zinc 50 MG TABS Take 50 mg by mouth daily.   Yes [provider]  Olopatadine HCl 0.2 % SOLN  12/18/18   [provider]  Polyethyl Glycol-Propyl Glycol (SYSTANE) 0.4-0.3 % SOLN Place 1 drop into both eyes 4 (four) times daily as needed (for dry eyes).     [provider]  pravastatin (PRAVACHOL) 20 MG tablet Take 1 tablet Daily for Cholesterol Patient not taking: Reported on 08/12/2019 06/24/19   [name], MD  prednisoLONE acetate (PRED FORTE) 1 % ophthalmic suspension Place 1 drop into both eyes 2 (two) times daily.  04/27/17   [provider]    Physical Exam: Vitals:   08/12/19 1918 08/12/19 2027 08/12/19 2030 08/12/19 2045  BP: (!) 156/107 (!) 152/55 (!) 153/57 (!) 143/75  Pulse: 74  70 69  Resp: (!) 22 (!) 22 19 17   Temp:  (!) 97.5 F (36.4 C)    TempSrc:  Oral    SpO2: 90% 95% 96% 96%     . General:  Chronically ill appearing 79 yo female, mildly confused and disoriented, no acute distress . Eyes:  PERRL, EOMI, no nystagmus . ENT:  grossly normal hearing, lips & tongue, dry mucus membranes . Neck:  no LAD, masses or thyromegaly; no carotid bruits . Cardiovascular:  Irregular rhythm, no m/r/g. No LE edema.  Marland Kitchen Respiratory:   CTA bilaterally with no wheezes/rales/rhonchi.  Normal respiratory effort. . Abdomen:  soft, NT, ND, NABS . Back:   normal alignment, no CVAT . Skin:  no rash or induration seen on limited exam . Musculoskeletal:  Rheumatoid changes on hands . Lower  extremity:  No LE edema.  Limited foot exam with no ulcerations.  2+ distal pulses. Marland Kitchen Psychiatric:  speech fluent and appropriate . Neurologic:  No focal deficits, moves all extremities in coordinated fashion, sensation intact    Radiological Exams on Admission: CT Head Wo Contrast  Result Date: 08/12/2019 CLINICAL DATA:  Head trauma.  Unwitnessed fall. EXAM: CT HEAD WITHOUT CONTRAST TECHNIQUE: Contiguous axial images were obtained from the base of the skull through the vertex without intravenous contrast. COMPARISON:  January 12, 2014 FINDINGS: Brain: No subdural, epidural, or subarachnoid hemorrhage. Ventricles and sulci are prominent but stable. Cerebellum, brainstem, and basal cisterns are within normal limits. Chronic white matter changes are noted. No acute cortical ischemia or infarct. No mass effect or midline shift. Vascular: Calcified atherosclerosis in the intracranial carotids. Skull: Normal. Negative for fracture or focal lesion. Sinuses/Orbits: No acute finding. Other: None. IMPRESSION: 1. No acute intracranial abnormalities identified. Electronically Signed   By: Dorise Bullion III M.D   On: 08/12/2019 14:20   US RENAL  Result Date: 08/12/2019 CLINICAL DATA:  Renal injury. EXAM: RENAL / URINARY TRACT ULTRASOUND COMPLETE COMPARISON:  None. FINDINGS: Right Kidney: Renal measurements: 8.8 x 4.5 x 4.5 cm = volume: 82.3 mL. Increased cortical echogenicity. There may be slight perinephric fluid. No hydronephrosis. Left Kidney: Renal measurements: 9.1 x 5.3 x 5.0 cm = volume: 125 mL. Increased cortical echogenicity. Bladder: Appears normal for degree of bladder distention. Other: None. IMPRESSION: Medical renal disease.  No acute abnormalities.  No hydronephrosis. Electronically Signed   By: Dorise Bullion III M.D   On: 08/12/2019 18:56   DG Chest Port 1 View  Result Date: 08/12/2019 CLINICAL DATA:  79 year old female with weakness. EXAM: PORTABLE CHEST 1 VIEW COMPARISON:  Chest radiograph  dated 08/07/2019. FINDINGS: There is diffuse chronic interstitial coarsening and bronchitic changes. Faint bilateral lower lung field interstitial densities, likely chronic. Developing infiltrate is not excluded. No focal consolidation, large pleural effusion, or pneumothorax. Stable cardiomegaly. Atherosclerotic calcification of the aorta. Left pectoral AICD device. No acute osseous pathology. IMPRESSION: 1. No focal consolidation. 2. Chronic interstitial coarsening and bronchitic changes. Electronically Signed   By: Anner Crete M.D.   On: 08/12/2019 16:05   US Abdomen Limited RUQ  Result Date: 08/12/2019 CLINICAL DATA:  Abdominal pain. EXAM: ULTRASOUND ABDOMEN LIMITED RIGHT UPPER QUADRANT COMPARISON:  None. FINDINGS: Gallbladder: Surgically absent. Common bile duct: Diameter: 4.4 mm Liver: Increased echogenicity with no focal masses. Portal vein is patent on color Doppler imaging with normal direction of blood flow towards the liver. Other: None. IMPRESSION: 1. Previous cholecystectomy. 2. No biliary duct dilatation. 3. Probable hepatic steatosis. Electronically Signed   By: Dorise Bullion III M.D   On: 08/12/2019 18:55    EKG: Independently reviewed.  IVCD in place.  LVH.  Large S waves in inferior leads with t wave inversions in lateral leads.  NSR with rate 70; Qtc 447 ms.   Labs on Admission: I have personally reviewed the available labs and imaging studies at the time of the admission.  Pertinent labs:   Na 132 K 4.5 Cl 92 Bicarbonate 21  BUN 46 Cr 2.78 mg/dL with baseline ~1.5 mg/dL  WBC 20K Ages 11/37 Platelets 258K  Assessment/Plan Active Problems:   Acute encephalopathy   Recurrent Falls: These may be cardiac in nature given abrupt onset and patient's history of smoking, peripheral vascular disease s/p 2015 endovascular stent, and heart disease.  I reviewed 2020 Echo. - Check EKG, Troponins, Echo.   - Continue Aspirin and Statin.   - Consult Cardiology, appreciate  assistance.  Acute Confusion: May have some acute delirium in the setting of developing dementia or may be adverse effect of new med amitriptyline.  Head CT showed no acute abnormality. - Check MRI of brain. - Perform Neuro checks q4hrs.  AKI on CKD3: Creatinine is 2.78 mg/dL with baseline ~ 1.5 mg/dL. - S/P Normal Saline x 2L.   - Start LR gently at 100 CC/hr x 10 hours.  Monitor respiratory status given history of CHF. - Check renal ultrasound.  Leukocytosis: This may be reactive given dehydration. - Check blood cultures and urine.  Hold off on antibiotics.  Hyperbilirubinemia/History of cholecystectomy - RUQ Ultrasound shows no residual stones and normal size CBD.  This is likely due to hepatic steatosis.  History of CHF with a BiVent ICD - Echo ordered.  Chronic Atrial Fibrillation - Continue home meds.  Hypertension - BP stable.  Hyperlipidemia - Statin.  Rheumatoid Arthritis/Gout   There is no height or weight on file to calculate BMI.  Note: This patient has been tested and is negative for the novel coronavirus COVID-19.  DVT prophylaxis: Lovenox Code Status: Full - confirmed with patient/family Family Communication: None present; I spoke with the patient's husband by telephone at the time of admission. Disposition Plan:  The patient is from: home  Anticipated d/c is to: home without [facility] services once her cardiology issues have been resolved.  Anticipated d/c date will depend on clinical response  to treatment, but possibly as early as tomorrow if she has excellent response to treatment  Patient is currently: acutely ill Consults called: Cardiology Admission status:  Inpatient with telemetry   George Hugh MD [facility]   How to contact the [facility] Attending or Consulting provider [place] or covering provider during after hours [place], for this patient?  1. Check the care team in [facility] and look for a) attending/consulting [facility] provider listed and b) the [facility]  team listed 2. Log into [email or web address] and use [place]'s universal password to access. If you do not have the password, please contact the hospital operator. 3. Locate the [facility] provider you are looking for under [facility] and page to a number that you can be directly reached. 4. If you still have difficulty reaching the provider, please page the [facility] (Director on Call) for the Hospitalists listed on amion for assistance.   08/12/2019, 8:52 PM

## 2019-08-13 ENCOUNTER — Inpatient Hospital Stay (HOSPITAL_COMMUNITY): Payer: Medicare Other

## 2019-08-13 DIAGNOSIS — T460X1A Poisoning by cardiac-stimulant glycosides and drugs of similar action, accidental (unintentional), initial encounter: Secondary | ICD-10-CM

## 2019-08-13 DIAGNOSIS — N179 Acute kidney failure, unspecified: Principal | ICD-10-CM

## 2019-08-13 DIAGNOSIS — I351 Nonrheumatic aortic (valve) insufficiency: Secondary | ICD-10-CM

## 2019-08-13 DIAGNOSIS — R296 Repeated falls: Secondary | ICD-10-CM

## 2019-08-13 DIAGNOSIS — S37009D Unspecified injury of unspecified kidney, subsequent encounter: Secondary | ICD-10-CM

## 2019-08-13 DIAGNOSIS — I350 Nonrheumatic aortic (valve) stenosis: Secondary | ICD-10-CM

## 2019-08-13 DIAGNOSIS — I361 Nonrheumatic tricuspid (valve) insufficiency: Secondary | ICD-10-CM

## 2019-08-13 LAB — COMPREHENSIVE METABOLIC PANEL
ALT: 17 U/L (ref 0–44)
AST: 24 U/L (ref 15–41)
Albumin: 2.3 g/dL — ABNORMAL LOW (ref 3.5–5.0)
Alkaline Phosphatase: 58 U/L (ref 38–126)
Anion gap: 15 (ref 5–15)
BUN: 45 mg/dL — ABNORMAL HIGH (ref 8–23)
CO2: 21 mmol/L — ABNORMAL LOW (ref 22–32)
Calcium: 7.2 mg/dL — ABNORMAL LOW (ref 8.9–10.3)
Chloride: 95 mmol/L — ABNORMAL LOW (ref 98–111)
Creatinine, Ser: 2.49 mg/dL — ABNORMAL HIGH (ref 0.44–1.00)
GFR calc Af Amer: 21 mL/min — ABNORMAL LOW (ref >60)
GFR calc non Af Amer: 18 mL/min — ABNORMAL LOW (ref >60)
Glucose, Bld: 86 mg/dL (ref 70–99)
Potassium: 4.1 mmol/L (ref 3.5–5.1)
Sodium: 131 mmol/L — ABNORMAL LOW (ref 135–145)
Total Bilirubin: 2.7 mg/dL — ABNORMAL HIGH (ref 0.3–1.2)
Total Protein: 5.1 g/dL — ABNORMAL LOW (ref 6.5–8.1)

## 2019-08-13 LAB — BRAIN NATRIURETIC PEPTIDE: B Natriuretic Peptide: 713.5 pg/mL — ABNORMAL HIGH (ref 0.0–100.0)

## 2019-08-13 LAB — CBC
HCT: 33.8 % — ABNORMAL LOW (ref 36.0–46.0)
Hemoglobin: 11 g/dL — ABNORMAL LOW (ref 12.0–15.0)
MCH: 28.8 pg (ref 26.0–34.0)
MCHC: 32.5 g/dL (ref 30.0–36.0)
MCV: 88.5 fL (ref 80.0–100.0)
Platelets: 218 10*3/uL (ref 150–400)
RBC: 3.82 MIL/uL — ABNORMAL LOW (ref 3.87–5.11)
RDW: 16.7 % — ABNORMAL HIGH (ref 11.5–15.5)
WBC: 19.4 10*3/uL — ABNORMAL HIGH (ref 4.0–10.5)
nRBC: 0 % (ref 0.0–0.2)

## 2019-08-13 LAB — ECHOCARDIOGRAM COMPLETE

## 2019-08-13 LAB — TROPONIN I (HIGH SENSITIVITY): Troponin I (High Sensitivity): 91 ng/L — ABNORMAL HIGH (ref <18)

## 2019-08-13 LAB — CK: Total CK: 50 U/L (ref 38–234)

## 2019-08-13 MED ORDER — MORPHINE SULFATE (PF) 2 MG/ML IV SOLN
1.0000 mg | Freq: Once | INTRAVENOUS | Status: AC
  Administered 2019-08-13: 1 mg via INTRAVENOUS
  Filled 2019-08-13: qty 1

## 2019-08-13 MED ORDER — LORAZEPAM BOLUS VIA INFUSION: 1.0000 mg | Freq: Once | INTRAVENOUS | Status: DC | PRN

## 2019-08-13 MED ORDER — LORAZEPAM 2 MG/ML IJ SOLN: 1.0000 mg | Freq: Once | INTRAMUSCULAR | Status: DC

## 2019-08-13 MED ORDER — DICLOFENAC SODIUM 1 % EX GEL
2.0000 g | Freq: Four times a day (QID) | CUTANEOUS | Status: DC | PRN
  Administered 2019-08-13 – 2019-08-16 (×4): 2 g via TOPICAL
  Filled 2019-08-13: qty 100

## 2019-08-13 MED ORDER — GABAPENTIN 600 MG PO TABS
300.0000 mg | ORAL_TABLET | Freq: Every day | ORAL | Status: DC
  Administered 2019-08-13: 300 mg via ORAL
  Filled 2019-08-13: qty 1

## 2019-08-13 MED ORDER — TRAMADOL HCL 50 MG PO TABS
50.0000 mg | ORAL_TABLET | Freq: Once | ORAL | Status: AC
  Administered 2019-08-13: 50 mg via ORAL
  Filled 2019-08-13: qty 1

## 2019-08-13 NOTE — Progress Notes (Signed)
Paged on call for patient reporting pain 10/10, generalized body pain.

## 2019-08-13 NOTE — Progress Notes (Signed)
Received page for generalized 10/10 pain after a fall despite using Tylenol and Voltaren gel. Patient unable to get comfortable. Called RN to discuss. Will give one dose of Tramadol. Patient with allergy to codeine but has tolerated Tramadol per meds on home meds list.   Barrington Ellison, MD [facility]

## 2019-08-13 NOTE — Progress Notes (Signed)
Pt had not voided this shift. Abdomen was distended and she felt the urge to void but unable to. In and Out cath performed, yielding 900 mL. Patient tolerated well. Peri care performed. Will continue to monitor.

## 2019-08-13 NOTE — Progress Notes (Addendum)
PROGRESS NOTE    Katie [name]  ASN:[id number] DOB: 1941/01/10 DOA: 08/12/2019 PCP: [name], MD  Brief Narrative:  Katie [name] is a 79 y.o. female with PMH of Migraines, Chronic Atrial Fibrillation, chronic systolic CHF/N ICM with improved EF recently on echo, ICD,, COPD, chronic pain, peripheral neuropathy, Peripheral Vascular Disease who presented to the ED after experiencing two falls followed by some confusion. -She has had 3 unwitnessed falls in the last 1 to 2 weeks, denies loss of consciousness. -Patient recently started Amitryptiline but is not on any other new mediations.   -Work-up in the ED noted sodium of 132, creatinine of 3.78 up from baseline of 1.3 in 3/21, also noted to have leukocytosis, elevated digoxin level, EKG with V paced, renal ultrasound without hydronephrosis   Assessment & Plan:   Falls, confusion -I suspect this is secondary to acute kidney injury, polypharmacy, decreased renal clearance of numerous meds including gabapentin, amitriptyline, worsened by polypharmacy, baclofen, Benadryl, tizanidine -Unable to rule out syncope, EKG is paced, monitor on telemetry -Follow-up 2D echocardiogram -Will discuss with cards regarding interrogating her ICD -Exam is nonfocal, CT head is unremarkable -PT OT eval  Acute kidney injury on CKD stage III -Baseline creatinine around 1.3-1.5, creatinine on admission was 2.8 -Hydrated with 3 L of fluids in the ED since admission -Hold off on additional IV fluids today, hold torsemide and metolazone -Slight improvement in creatinine noted, renal ultrasound without hydronephrosis -Monitor urine output and kidney function, hold digoxin  Leukocytosis -She is afebrile, chest x-ray urinalysis etc. are unremarkable -This could be reactive, monitor  Mild hyperbilirubinemia -Likely from fatty liver disease, right upper quadrant ultrasound is unremarkable  History of chronic systolic, diastolic CHF with BiV ICD -We will  discuss with cards regarding interrogating her ICD -Hold diuretics in the setting of AKI -Elevated digoxin level in the setting of AKI, hold dig  Chronic atrial fibrillation -Continue Xarelto  History of rheumatoid arthritis/gout -Stable -Hold leflunomide  DVT prophylaxis: Xarelto Code Status: Full code Family Communication: Daughter at bedside Disposition Plan:  Status is: Inpatient  Remains inpatient appropriate because:Ongoing diagnostic testing needed not appropriate for outpatient work up   Dispo: The patient is from: Home              Anticipated d/c is to: Home              Anticipated d/c date is: 2 days              Patient currently is not medically stable to d/c.   Procedures:   Antimicrobials:    Subjective: -Complains of aches and pains all over -Denies any chest pain, reports mild dyspnea -  Objective: Vitals:   08/12/19 2117 08/13/19 0027 08/13/19 0428 08/13/19 0712  BP: 135/75 (!) 119/58 (!) 131/58 109/61  Pulse: 69 70 69 70  Resp: 18 18 17 18   Temp: 97.6 F (36.4 C) 97.9 F (36.6 C) 97.9 F (36.6 C) 97.7 F (36.5 C)  TempSrc: Oral Oral  Oral  SpO2: 96%  95% 95%    Intake/Output Summary (Last 24 hours) at 08/13/2019 1008 Last data filed at 08/13/2019 0535 Gross per 24 hour  Intake --  Output 900 ml  Net -900 ml   There were no vitals filed for this visit.  Examination:  General exam: Elderly chronically ill female awake alert oriented to self and place Respiratory system: Diminished breath sounds at both bases, poor air movement, no wheezes Cardiovascular system: S1 & S2 heard, RRR.  Gastrointestinal system: Abdomen is nondistended, soft and nontender.Normal bowel sounds heard. Central nervous system: Alert and oriented. No focal neurological deficits. Extremities: No edema Skin: No rashes no rashes on exposed skin  psychiatry: Poor insight and judgment    Data Reviewed:   CBC: Recent Labs  Lab 08/12/19 1349 08/13/19 0654   WBC 20.3* 19.4*  HGB 11.6* 11.0*  HCT 37.2 33.8*  MCV 92.1 88.5  PLT 258 268   Basic Metabolic Panel: Recent Labs  Lab 08/12/19 1349 08/13/19 0654  NA 132* 131*  K 4.5 4.1  CL 92* 95*  CO2 21* 21*  GLUCOSE 75 86  BUN 46* 45*  CREATININE 2.78* 2.49*  CALCIUM 9.1 7.2*   GFR: Estimated Creatinine Clearance: 19.4 mL/min (A) (by C-G formula based on SCr of 2.49 mg/dL (H)). Liver Function Tests: Recent Labs  Lab 08/12/19 1349 08/13/19 0654  AST 27 24  ALT 19 17  ALKPHOS 59 58  BILITOT 2.5* 2.7*  PROT 6.8 5.1*  ALBUMIN 3.2* 2.3*   No results for input(s): LIPASE, AMYLASE in the last 168 hours. No results for input(s): AMMONIA in the last 168 hours. Coagulation Profile: No results for input(s): INR, PROTIME in the last 168 hours. Cardiac Enzymes: Recent Labs  Lab 08/13/19 0654  CKTOTAL 50   BNP (last 3 results) No results for input(s): PROBNP in the last 8760 hours. HbA1C: No results for input(s): HGBA1C in the last 72 hours. CBG: No results for input(s): GLUCAP in the last 168 hours. Lipid Profile: No results for input(s): CHOL, HDL, LDLCALC, TRIG, CHOLHDL, LDLDIRECT in the last 72 hours. Thyroid Function Tests: Recent Labs    08/12/19 1755  TSH 1.206   Anemia Panel: No results for input(s): VITAMINB12, FOLATE, FERRITIN, TIBC, IRON, RETICCTPCT in the last 72 hours. Urine analysis:    Component Value Date/Time   COLORURINE YELLOW 08/12/2019 1556   APPEARANCEUR CLEAR 08/12/2019 1556   LABSPEC 1.009 08/12/2019 1556   PHURINE 5.0 08/12/2019 1556   GLUCOSEU NEGATIVE 08/12/2019 1556   HGBUR NEGATIVE 08/12/2019 1556   [place] 08/12/2019 1556   [place] 08/12/2019 1556   PROTEINUR NEGATIVE 08/12/2019 1556   UROBILINOGEN 0.2 05/08/2014 1232   NITRITE NEGATIVE 08/12/2019 1556   [place] 08/12/2019 1556   Sepsis Labs: @LABRCNTIP (procalcitonin:4,lacticidven:4)  ) Recent Results (from the past 240 hour(s))  SARS  Coronavirus 2 by RT PCR (hospital order, performed in [facility] hospital lab) Nasopharyngeal Nasopharyngeal Swab     Status: None   Collection Time: 08/12/19  4:44 PM   Specimen: Nasopharyngeal Swab  Result Value Ref Range Status   SARS Coronavirus 2 NEGATIVE NEGATIVE Final    Comment: (NOTE) SARS-CoV-2 target nucleic acids are NOT DETECTED. The SARS-CoV-2 RNA is generally detectable in upper and lower respiratory specimens during the acute phase of infection. The lowest concentration of SARS-CoV-2 viral copies this assay can detect is 250 copies / mL. A negative result does not preclude SARS-CoV-2 infection and should not be used as the sole basis for treatment or other patient management decisions.  A negative result may occur with improper specimen collection / handling, submission of specimen other than nasopharyngeal swab, presence of viral mutation(s) within the areas targeted by this assay, and inadequate number of viral copies (<250 copies / mL). A negative result must be combined with clinical observations, patient history, and epidemiological information. Fact Sheet for Patients:   StrictlyIdeas.no Fact Sheet for Healthcare Providers: BankingDealers.co.za This test is not yet approved or cleared  by the [place] and has been authorized for detection and/or diagnosis of SARS-CoV-2 by FDA under an Emergency Use Authorization (EUA).  This EUA will remain in effect (meaning this test can be used) for the duration of the COVID-19 declaration under Section 564(b)(1) of the Act, 21 U.S.C. section 360bbb-3(b)(1), unless the authorization is terminated or revoked sooner. Performed at [facility], [place] [address], [place], [place] [address]   Culture, blood (routine x 2)     Status: None (Preliminary result)   Collection Time: 08/12/19  5:55 PM   Specimen: BLOOD  Result Value Ref Range Status   Specimen Description  BLOOD BLOOD LEFT HAND  Final   Special Requests   Final    BOTTLES DRAWN AEROBIC AND ANAEROBIC Blood Culture results may not be optimal due to an inadequate volume of blood received in culture bottles   Culture   Final    NO GROWTH < 24 HOURS Performed at [facility], [place] [address], [place], [place] [address]    Report Status PENDING  Incomplete         Radiology Studies: CT Head Wo Contrast  Result Date: 08/12/2019 CLINICAL DATA:  Head trauma.  Unwitnessed fall. EXAM: CT HEAD WITHOUT CONTRAST TECHNIQUE: Contiguous axial images were obtained from the base of the skull through the vertex without intravenous contrast. COMPARISON:  January 12, 2014 FINDINGS: Brain: No subdural, epidural, or subarachnoid hemorrhage. Ventricles and sulci are prominent but stable. Cerebellum, brainstem, and basal cisterns are within normal limits. Chronic white matter changes are noted. No acute cortical ischemia or infarct. No mass effect or midline shift. Vascular: Calcified atherosclerosis in the intracranial carotids. Skull: Normal. Negative for fracture or focal lesion. Sinuses/Orbits: No acute finding. Other: None. IMPRESSION: 1. No acute intracranial abnormalities identified. Electronically Signed   By: Dorise Bullion III M.D   On: 08/12/2019 14:20   US RENAL  Result Date: 08/12/2019 CLINICAL DATA:  Renal injury. EXAM: RENAL / URINARY TRACT ULTRASOUND COMPLETE COMPARISON:  None. FINDINGS: Right Kidney: Renal measurements: 8.8 x 4.5 x 4.5 cm = volume: 82.3 mL. Increased cortical echogenicity. There may be slight perinephric fluid. No hydronephrosis. Left Kidney: Renal measurements: 9.1 x 5.3 x 5.0 cm = volume: 125 mL. Increased cortical echogenicity. Bladder: Appears normal for degree of bladder distention. Other: None. IMPRESSION: Medical renal disease.  No acute abnormalities.  No hydronephrosis. Electronically Signed   By: Dorise Bullion III M.D   On: 08/12/2019 18:56   DG Pelvis  Portable  Result Date: 08/13/2019 CLINICAL DATA:  Fall 1 week ago with progressive pelvic pain, initial encounter EXAM: PORTABLE PELVIS 1-2 VIEWS COMPARISON:  None. FINDINGS: Pelvic ring is intact. No acute fracture or dislocation is noted. Degenerative changes of the hip joints and lumbar spine are seen. Bilateral iliac stents are noted. IMPRESSION: No acute abnormality noted. Electronically Signed   By: Inez Catalina M.D.   On: 08/13/2019 02:25   DG Chest Port 1 View  Result Date: 08/12/2019 CLINICAL DATA:  79 year old female with weakness. EXAM: PORTABLE CHEST 1 VIEW COMPARISON:  Chest radiograph dated 08/07/2019. FINDINGS: There is diffuse chronic interstitial coarsening and bronchitic changes. Faint bilateral lower lung field interstitial densities, likely chronic. Developing infiltrate is not excluded. No focal consolidation, large pleural effusion, or pneumothorax. Stable cardiomegaly. Atherosclerotic calcification of the aorta. Left pectoral AICD device. No acute osseous pathology. IMPRESSION: 1. No focal consolidation. 2. Chronic interstitial coarsening and bronchitic changes. Electronically Signed   By: Milas Hock  Radparvar M.D.   On: 08/12/2019 16:05   DG Knee Left Port  Result Date: 08/13/2019 CLINICAL DATA:  Fall 1 week ago with left knee pain, initial encounter EXAM: PORTABLE LEFT KNEE - 4 VIEW COMPARISON:  None. FINDINGS: No evidence of fracture, dislocation, or joint effusion. Meniscal calcifications are noted. Mild medial joint space narrowing is seen. IMPRESSION: Mild degenerative change without acute abnormality. Electronically Signed   By: Inez Catalina M.D.   On: 08/13/2019 02:27   DG Knee Right Port  Result Date: 08/13/2019 CLINICAL DATA:  Fall 1 week ago with right knee pain, initial encounter EXAM: PORTABLE RIGHT KNEE - 4 VIEW COMPARISON:  None. FINDINGS: No evidence of fracture, dislocation, or joint effusion. No evidence of arthropathy or other focal bone abnormality. Mild meniscal  calcifications are seen. IMPRESSION: No acute abnormality noted. Electronically Signed   By: Inez Catalina M.D.   On: 08/13/2019 02:26   ECHOCARDIOGRAM COMPLETE  Result Date: 08/13/2019    ECHOCARDIOGRAM REPORT   Patient Name:   Katie [name] Date of Exam: 08/13/2019 Medical Rec #:  [id number]    Height:       63.0 in Accession #:    [id number]   Weight:       185.0 lb Date of Birth:  May 01, 1940    BSA:          1.871 m Patient Age:    63 years     BP:           109/61 mmHg Patient Gender: F            HR:           70 bpm. Exam Location:  Inpatient Procedure: 2D Echo, Color Doppler and Cardiac Doppler Indications:    R55 Syncope  History:        Patient has prior history of Echocardiogram examinations, most                 recent 05/23/2018. Aortic Valve Disease. Recent fall.  Sonographer:    Merrie Roof RDCS Referring Phys: [id number] [place]  1. Left ventricular ejection fraction, by estimation, is 60 to 65%. The left ventricle has normal function. The left ventricle has no regional wall motion abnormalities. There is moderate left ventricular hypertrophy. Left ventricular diastolic parameters were normal.  2. Right ventricular systolic function is normal. The right ventricular size is normal. There is severely elevated pulmonary artery systolic pressure.  3. Left atrial size was mildly dilated.  4. The mitral valve is grossly normal. No evidence of mitral valve regurgitation.  5. The aortic valve is tricuspid. Aortic valve regurgitation is mild. Mild aortic valve stenosis. Aortic valve area, by VTI measures 1.52 cm. Aortic valve mean gradient measures 11.5 mmHg. Aortic valve Vmax measures 2.50 m/s.  6. The inferior vena cava is dilated in size with <50% respiratory variability, suggesting right atrial pressure of 15 mmHg. Comparison(s): Changes from prior study are noted. LVEF 55-60%, mild AS - mean gradient 10 mmHg. FINDINGS  Left Ventricle: Left ventricular ejection fraction, by estimation, is  60 to 65%. The left ventricle has normal function. The left ventricle has no regional wall motion abnormalities. The left ventricular internal cavity size was normal in size. There is  moderate left ventricular hypertrophy. Left ventricular diastolic parameters were normal. Right Ventricle: The right ventricular size is normal. No increase in right ventricular wall thickness. Right ventricular systolic function is normal. There is severely elevated pulmonary artery systolic pressure. The tricuspid  regurgitant velocity is 3.92 m/s, and with an assumed right atrial pressure of 15 mmHg, the estimated right ventricular systolic pressure is 23.7 mmHg. Left Atrium: Left atrial size was mildly dilated. Right Atrium: Right atrial size was normal in size. Pericardium: There is no evidence of pericardial effusion. Mitral Valve: The mitral valve is grossly normal. No evidence of mitral valve regurgitation. Tricuspid Valve: The tricuspid valve is grossly normal. Tricuspid valve regurgitation is mild. Aortic Valve: The aortic valve is tricuspid. Aortic valve regurgitation is mild. Aortic regurgitation PHT measures 533 msec. Mild aortic stenosis is present. Aortic valve mean gradient measures 11.5 mmHg. Aortic valve peak gradient measures 25.0 mmHg. Aortic valve area, by VTI measures 1.52 cm. Pulmonic Valve: The pulmonic valve was grossly normal. Pulmonic valve regurgitation is trivial. Aorta: The aortic root and ascending aorta are structurally normal, with no evidence of dilitation. Venous: The inferior vena cava is dilated in size with less than 50% respiratory variability, suggesting right atrial pressure of 15 mmHg. IAS/Shunts: No atrial level shunt detected by color flow Doppler. Additional Comments: A pacer wire is visualized.  LEFT VENTRICLE PLAX 2D LVIDd:         4.80 cm     Diastology LVIDs:         3.40 cm     LV e' lateral:   9.79 cm/s LV PW:         1.20 cm     LV E/e' lateral: 9.8 LV IVS:        1.20 cm     LV e'  medial:    8.49 cm/s LVOT diam:     2.00 cm     LV E/e' medial:  11.3 LV SV:         57 LV SV Index:   31 LVOT Area:     3.14 cm  LV Volumes (MOD) LV vol d, MOD A4C: 70.5 ml LV vol s, MOD A4C: 22.6 ml LV SV MOD A4C:     70.5 ml RIGHT VENTRICLE             IVC RV S prime:     12.10 cm/s  IVC diam: 2.70 cm TAPSE (M-mode): 2.0 cm LEFT ATRIUM              Index LA diam:        4.80 cm  2.57 cm/m LA Vol (A2C):   120.0 ml 64.15 ml/m LA Vol (A4C):   69.6 ml  37.21 ml/m LA Biplane Vol: 91.2 ml  48.75 ml/m  AORTIC VALVE AV Area (Vmax):    1.31 cm AV Area (Vmean):   1.42 cm AV Area (VTI):     1.52 cm AV Vmax:           250.00 cm/s AV Vmean:          155.000 cm/s AV VTI:            0.377 m AV Peak Grad:      25.0 mmHg AV Mean Grad:      11.5 mmHg LVOT Vmax:         104.00 cm/s LVOT Vmean:        70.100 cm/s LVOT VTI:          0.182 m LVOT/AV VTI ratio: 0.48 AI PHT:            533 msec  AORTA Ao Root diam: 3.50 cm Ao Asc diam:  3.45 cm MITRAL VALVE  TRICUSPID VALVE MV Area (PHT): 4.15 cm    TR Peak grad:   61.5 mmHg MV Decel Time: 183 msec    TR Vmax:        392.00 cm/s MV E velocity: 96.30 cm/s MV A velocity: 55.20 cm/s  SHUNTS MV E/A ratio:  1.74        Systemic VTI:  0.18 m                            Systemic Diam: 2.00 cm Lyman Bishop MD Electronically signed by Lyman Bishop MD Signature Date/Time: 08/13/2019/10:06:36 AM    Final    US Abdomen Limited RUQ  Result Date: 08/12/2019 CLINICAL DATA:  Abdominal pain. EXAM: ULTRASOUND ABDOMEN LIMITED RIGHT UPPER QUADRANT COMPARISON:  None. FINDINGS: Gallbladder: Surgically absent. Common bile duct: Diameter: 4.4 mm Liver: Increased echogenicity with no focal masses. Portal vein is patent on color Doppler imaging with normal direction of blood flow towards the liver. Other: None. IMPRESSION: 1. Previous cholecystectomy. 2. No biliary duct dilatation. 3. Probable hepatic steatosis. Electronically Signed   By: Dorise Bullion III M.D   On: 08/12/2019 18:55         Scheduled Meds: . albuterol  3 mL Inhalation See admin instructions  . aspirin EC  81 mg Oral Daily  . gabapentin  300 mg Oral QHS  . levothyroxine  100 mcg Oral QAC breakfast  . mometasone-formoterol  2 puff Inhalation BID  . montelukast  10 mg Oral Daily  . pravastatin  20 mg Oral QHS  . Rivaroxaban  15 mg Oral Q supper   Continuous Infusions:   LOS: 1 day    Time spent: 25min  Domenic Polite, MD [facility]  08/13/2019, 10:08 AM

## 2019-08-13 NOTE — Evaluation (Signed)
Physical Therapy Evaluation Patient Details Name: Katie [name] MRN: [id number] DOB: 1940-06-26 Today's Date: 08/13/2019   History of Present Illness  Patient is 79 y/o female who presents s/p falls and confusion suspected likely from AKI, polypharmacy and decreased renal clearance of numerous meds including gabapentin and amitriptyline. PMH includes migraines, chronic systolic CHF, ICD, COPD, chronic pain, peripheral neuropathy, PVD, A-fib, CKD, CHF, COPD, CVA, CAD, pulmonary fibrosis and aortic aneurysm.  Clinical Impression  Patient presents with generalized weakness, decreased activity tolerance, soreness, impaired balance and impaired mobility s/p above. Pt lives with spouse and reports independent for ADLs and walking PTA. Reports 3 falls in last 6 months. Today, pt requires Mod A for transfers and Min guard for short distance ambulation with use of RW for support. Fatigues quickly with limited activity. Pt deferred further mobility. Pt reports she wants to go to "[facility]?" for rehab pending improvement here in the hospital. Will follow acutely to maximize independence and mobility prior to return home vs SNF.    Follow Up Recommendations SNF;Supervision for mobility/OOB(pt interested in "[facility]?" pending improvement)    Equipment Recommendations  None recommended by PT    Recommendations for Other Services       Precautions / Restrictions Precautions Precautions: Fall Precaution Comments: 3 falls in las 1-2 weeks Restrictions Weight Bearing Restrictions: No      Mobility  Bed Mobility Overal bed mobility: Needs Assistance Bed Mobility: Sit to Supine       Sit to supine: Mod assist   General bed mobility comments: Assist bringing LEs into bed to return to supine; cues for technique.  Transfers Overall transfer level: Needs assistance Equipment used: Rolling walker (2 wheeled) Transfers: Sit to/from Stand Sit to Stand: Mod assist         General transfer  comment: assist to power to standing with cues for technique/hand placement.  Ambulation/Gait Ambulation/Gait assistance: Min guard Gait Distance (Feet): 6 Feet Assistive device: Rolling walker (2 wheeled) Gait Pattern/deviations: Step-through pattern;Trunk flexed;Decreased step length - right;Decreased step length - left;Shuffle Gait velocity: decreased Gait velocity interpretation: <1.31 ft/sec, indicative of household ambulator General Gait Details: Slow, mildly unsteady gait with use of RW for support. Fatigued from sitting up for 1.5 hours, "that wiped me out,"  Stairs            Wheelchair Mobility    Modified Rankin (Stroke Patients Only)       Balance Overall balance assessment: Needs assistance;History of Falls Sitting-balance support: Feet supported;No upper extremity supported Sitting balance-Leahy Scale: Fair Sitting balance - Comments: supervision for safety   Standing balance support: During functional activity Standing balance-Leahy Scale: Poor Standing balance comment: Requires UE support in standing.                             Pertinent Vitals/Pain Pain Assessment: Faces Faces Pain Scale: Hurts little more Pain Location: right side of trunk/chest Pain Descriptors / Indicators: Sore Pain Intervention(s): Repositioned;Monitored during session    Home Living Family/patient expects to be discharged to:: Private residence Living Arrangements: Spouse/significant other Available Help at Discharge: Family;Available 24 hours/day Type of Home: House Home Access: Stairs to enter Entrance Stairs-Rails: Right;Left;Can reach both Entrance Stairs-Number of Steps: 5 + 3 Home Layout: One level Home Equipment: Walker - 2 wheels;Walker - 4 wheels;Cane - single point;Bedside commode;Grab bars - tub/shower      Prior Function Level of Independence: Independent  Comments: Does ADLs, IADLs, reports 2 falls. Drives. Walks independently.  Reports 3 falls in last 6 months. Attributes this to having lots of close people to her die recently.     Hand Dominance   Dominant Hand: Right    Extremity/Trunk Assessment   Upper Extremity Assessment Upper Extremity Assessment: Defer to OT evaluation    Lower Extremity Assessment Lower Extremity Assessment: Generalized weakness(soreness all over)       Communication   Communication: No difficulties  Cognition Arousal/Alertness: Awake/alert Behavior During Therapy: WFL for tasks assessed/performed Overall Cognitive Status: No family/caregiver present to determine baseline cognitive functioning                                 General Comments: "1921" for year. "June." Follows simple 1-2 step commands. A&Ox3, not really sure why she is in the hospital, roughly gets date right. Attributes falls to recent deaths of people close to her this year.      General Comments General comments (skin integrity, edema, etc.): Family arrived post session.    Exercises     Assessment/Plan    PT Assessment Patient needs continued PT services  PT Problem List Decreased strength;Decreased mobility;Impaired sensation;Pain;Decreased balance;Decreased activity tolerance;Decreased cognition       PT Treatment Interventions Therapeutic activities;Gait training;Therapeutic exercise;Patient/family education;Balance training;Functional mobility training;Stair training    PT Goals (Current goals can be found in the Care Plan section)  Acute Rehab PT Goals Patient Stated Goal: to get stronger PT Goal Formulation: With patient Time For Goal Achievement: 08/27/19 Potential to Achieve Goals: Good    Frequency Min 3X/week   Barriers to discharge        Co-evaluation               AM-PAC PT "6 Clicks" Mobility  Outcome Measure Help needed turning from your back to your side while in a flat bed without using bedrails?: A Little Help needed moving from lying on your back  to sitting on the side of a flat bed without using bedrails?: A Lot Help needed moving to and from a bed to a chair (including a wheelchair)?: A Lot Help needed standing up from a chair using your arms (e.g., wheelchair or bedside chair)?: A Lot Help needed to walk in hospital room?: A Little Help needed climbing 3-5 steps with a railing? : A Little 6 Click Score: 15    End of Session Equipment Utilized During Treatment: Gait belt Activity Tolerance: Patient limited by fatigue Patient left: in bed;with call bell/phone within reach;with bed alarm set;with family/visitor present Nurse Communication: Mobility status PT Visit Diagnosis: Unsteadiness on feet (R26.81);Muscle weakness (generalized) (M62.81);Difficulty in walking, not elsewhere classified (R26.2);Pain Pain - part of body: (everywhere)    Time: 3329-5188 PT Time Calculation (min) (ACUTE ONLY): 20 min   Charges:   PT Evaluation $PT Eval Moderate Complexity: 1 Mod          Marisa Severin, PT, DPT Acute Rehabilitation Services Pager [phone] Office [phone]      Marguarite Arbour A Sabra Heck 08/13/2019, 3:04 PM

## 2019-08-14 ENCOUNTER — Ambulatory Visit: Payer: Medicare Other | Admitting: Adult Health Nurse Practitioner

## 2019-08-14 LAB — CBC
HCT: 33.7 % — ABNORMAL LOW (ref 36.0–46.0)
Hemoglobin: 11 g/dL — ABNORMAL LOW (ref 12.0–15.0)
MCH: 28.3 pg (ref 26.0–34.0)
MCHC: 32.6 g/dL (ref 30.0–36.0)
MCV: 86.6 fL (ref 80.0–100.0)
Platelets: 226 10*3/uL (ref 150–400)
RBC: 3.89 MIL/uL (ref 3.87–5.11)
RDW: 16.7 % — ABNORMAL HIGH (ref 11.5–15.5)
WBC: 16.8 10*3/uL — ABNORMAL HIGH (ref 4.0–10.5)
nRBC: 0 % (ref 0.0–0.2)

## 2019-08-14 LAB — BASIC METABOLIC PANEL
Anion gap: 13 (ref 5–15)
BUN: 42 mg/dL — ABNORMAL HIGH (ref 8–23)
CO2: 23 mmol/L (ref 22–32)
Calcium: 7.5 mg/dL — ABNORMAL LOW (ref 8.9–10.3)
Chloride: 97 mmol/L — ABNORMAL LOW (ref 98–111)
Creatinine, Ser: 2.23 mg/dL — ABNORMAL HIGH (ref 0.44–1.00)
GFR calc Af Amer: 24 mL/min — ABNORMAL LOW (ref >60)
GFR calc non Af Amer: 21 mL/min — ABNORMAL LOW (ref >60)
Glucose, Bld: 91 mg/dL (ref 70–99)
Potassium: 4 mmol/L (ref 3.5–5.1)
Sodium: 133 mmol/L — ABNORMAL LOW (ref 135–145)

## 2019-08-14 MED ORDER — GABAPENTIN 600 MG PO TABS
300.0000 mg | ORAL_TABLET | Freq: Two times a day (BID) | ORAL | Status: DC
  Administered 2019-08-14 – 2019-08-15 (×3): 300 mg via ORAL
  Filled 2019-08-14 (×3): qty 1

## 2019-08-14 MED ORDER — TRAMADOL HCL 50 MG PO TABS
50.0000 mg | ORAL_TABLET | Freq: Four times a day (QID) | ORAL | Status: DC | PRN
  Administered 2019-08-14 – 2019-08-18 (×9): 50 mg via ORAL
  Filled 2019-08-14 (×9): qty 1

## 2019-08-14 NOTE — Progress Notes (Signed)
PROGRESS NOTE    Katie [name]  RWE:[id number] DOB: [birth year] DOA: 08/12/2019 PCP: [name], MD  Brief Narrative:  Katie [name] is a 79 y.o. female with PMH of Migraines, Chronic Atrial Fibrillation, chronic systolic CHF/N ICM with improved EF recently on echo, ICD,, COPD, chronic pain, peripheral neuropathy, Peripheral Vascular Disease who presented to the ED after experiencing two falls and increased confusion -She has had 3 unwitnessed falls in the last 1 to 2 weeks, denies loss of consciousness -Patient recently started Amitryptiline but is not on any other new mediations.   -Work-up in the ED noted sodium of 132, creatinine of 2.78 up from baseline of 1.5, also noted to have leukocytosis, elevated digoxin level, EKG with V paced, renal ultrasound without hydronephrosis  Assessment & Plan:   Falls, confusion -I suspect this is secondary to acute kidney injury, polypharmacy, decreased renal clearance of numerous meds including gabapentin (600mg  TID), amitriptyline(QID), worsened by polypharmacy, baclofen, Benadryl, tizanidine -Unable to rule out syncope, EKG is paced, no events on telemetry -Discussed with cardiology yesterday, pacemaker interrogation was completed and normal, no arrhythmias noted -2D echocardiogram noted normal EF, LVH, normal size RV and RV function, elevated PA systolic pressure -Exam is nonfocal, CT head is unremarkable -Mental status has improved considerably, continues to have a lot of pain from her falls and severe neuropathy, gabapentin restarted at lower dose -PT OT eval completed, SNF recommended for rehabilitation -Social work consulted  Acute kidney injury on CKD stage III -Baseline creatinine around 1.3-1.5, creatinine on admission was 2.8, likely from overdiuresis -Hydrated with 3 L of fluids in the ED since admission -Stopped IV fluids yesterday, torsemide and metolazone on hold -Creatinine improving down to 2.3 today, renal ultrasound without  hydronephrosis -Hold diuretics 1 more day, resume torsemide only tomorrow -Digoxin on hold, monitor urine output and kidney function  History of chronic systolic, diastolic CHF with BiV ICD - Recent echo with improvement in EF to 60%, discussed with cards, pacemaker interrogation yesterday was normal -Hold diuretics in the setting of AKI, likely restart tomorrow -Elevated digoxin level in the setting of AKI, hold dig  COPD/chronic respiratory failure -On home O2 nightly -Stable  Leukocytosis -She is afebrile, chest x-ray urinalysis etc. are unremarkable -Likely reactive, improving, monitor  Mild hyperbilirubinemia -Likely from fatty liver disease, right upper quadrant ultrasound is unremarkable  Chronic atrial fibrillation -Continue Xarelto  History of rheumatoid arthritis/gout -Stable -Hold leflunomide  DVT prophylaxis: Xarelto Code Status: Full code Family Communication: No family at bedside, discussed with patient's daughter yesterday 5/31 Disposition Plan:  Status is: Inpatient  Remains inpatient appropriate because:Ongoing diagnostic testing needed not appropriate for outpatient work up, improvement in kidney function, resumption of diuretics   Dispo: The patient is from: Home              Anticipated d/c is to: SNF              Anticipated d/c date is: 1 to 2 days              Patient currently is not medically stable to d/c.   Procedures:   Antimicrobials:    Subjective: -Feels better, but hurting all over, complains of neuropathy and pain in her arms as well -Breathing okay, feels like this is at her baseline  Objective: Vitals:   08/13/19 1919 08/13/19 2317 08/14/19 0338 08/14/19 0757  BP: (!) 119/58 120/69 (!) 122/59 (!) 105/56  Pulse: 69 70 70 70  Resp: 18 18 18 18   Temp:  97.6 F (36.4 C) 97.6 F (36.4 C) 98 F (36.7 C) 98.1 F (36.7 C)  TempSrc: Oral Oral Oral Oral  SpO2: 100% 97% 96% 94%    Intake/Output Summary (Last 24 hours) at  08/14/2019 1108 Last data filed at 08/14/2019 0835 Gross per 24 hour  Intake 340 ml  Output 750 ml  Net -410 ml   There were no vitals filed for this visit.  Examination:  General exam: Elderly chronically ill obese female, awake alert oriented to self place and partly to time HEENT: Neck obese unable to assess JVD CVS: S1-S2, regular rhythm Lungs with diminished breath sounds at both bases, poor air movement, no wheezes Abdomen is soft, nontender, nondistended, bowel sounds present Extremities with no edema, bruises and ecchymosis noted in both lower legs  Skin: No rashes on exposed skin, bruises and ecchymosis as above psychiatry: Normal mood and affect    Data Reviewed:   CBC: Recent Labs  Lab 08/12/19 1349 08/13/19 0654 08/14/19 0719  WBC 20.3* 19.4* 16.8*  HGB 11.6* 11.0* 11.0*  HCT 37.2 33.8* 33.7*  MCV 92.1 88.5 86.6  PLT 258 218 037   Basic Metabolic Panel: Recent Labs  Lab 08/12/19 1349 08/13/19 0654 08/14/19 0719  NA 132* 131* 133*  K 4.5 4.1 4.0  CL 92* 95* 97*  CO2 21* 21* 23  GLUCOSE 75 86 91  BUN 46* 45* 42*  CREATININE 2.78* 2.49* 2.23*  CALCIUM 9.1 7.2* 7.5*   GFR: Estimated Creatinine Clearance: 21.7 mL/min (A) (by C-G formula based on SCr of 2.23 mg/dL (H)). Liver Function Tests: Recent Labs  Lab 08/12/19 1349 08/13/19 0654  AST 27 24  ALT 19 17  ALKPHOS 59 58  BILITOT 2.5* 2.7*  PROT 6.8 5.1*  ALBUMIN 3.2* 2.3*   No results for input(s): LIPASE, AMYLASE in the last 168 hours. No results for input(s): AMMONIA in the last 168 hours. Coagulation Profile: No results for input(s): INR, PROTIME in the last 168 hours. Cardiac Enzymes: Recent Labs  Lab 08/13/19 0654  CKTOTAL 50   BNP (last 3 results) No results for input(s): PROBNP in the last 8760 hours. HbA1C: No results for input(s): HGBA1C in the last 72 hours. CBG: No results for input(s): GLUCAP in the last 168 hours. Lipid Profile: No results for input(s): CHOL, HDL,  LDLCALC, TRIG, CHOLHDL, LDLDIRECT in the last 72 hours. Thyroid Function Tests: Recent Labs    08/12/19 1755  TSH 1.206   Anemia Panel: No results for input(s): VITAMINB12, FOLATE, FERRITIN, TIBC, IRON, RETICCTPCT in the last 72 hours. Urine analysis:    Component Value Date/Time   COLORURINE YELLOW 08/12/2019 1556   APPEARANCEUR CLEAR 08/12/2019 1556   LABSPEC 1.009 08/12/2019 1556   PHURINE 5.0 08/12/2019 1556   GLUCOSEU NEGATIVE 08/12/2019 1556   HGBUR NEGATIVE 08/12/2019 1556   [place] 08/12/2019 1556   [place] 08/12/2019 1556   PROTEINUR NEGATIVE 08/12/2019 1556   UROBILINOGEN 0.2 05/08/2014 1232   NITRITE NEGATIVE 08/12/2019 1556   [place] 08/12/2019 1556   Sepsis Labs: @LABRCNTIP (procalcitonin:4,lacticidven:4)  ) Recent Results (from the past 240 hour(s))  SARS Coronavirus 2 by RT PCR (hospital order, performed in [place] hospital lab) Nasopharyngeal Nasopharyngeal Swab     Status: None   Collection Time: 08/12/19  4:44 PM   Specimen: Nasopharyngeal Swab  Result Value Ref Range Status   SARS Coronavirus 2 NEGATIVE NEGATIVE Final    Comment: (NOTE) SARS-CoV-2 target nucleic acids are NOT DETECTED. The SARS-CoV-2 RNA  is generally detectable in upper and lower respiratory specimens during the acute phase of infection. The lowest concentration of SARS-CoV-2 viral copies this assay can detect is 250 copies / mL. A negative result does not preclude SARS-CoV-2 infection and should not be used as the sole basis for treatment or other patient management decisions.  A negative result may occur with improper specimen collection / handling, submission of specimen other than nasopharyngeal swab, presence of viral mutation(s) within the areas targeted by this assay, and inadequate number of viral copies (<250 copies / mL). A negative result must be combined with clinical observations, patient history, and epidemiological  information. Fact Sheet for Patients:   StrictlyIdeas.no Fact Sheet for Healthcare Providers: BankingDealers.co.za This test is not yet approved or cleared  by the [place] FDA and has been authorized for detection and/or diagnosis of SARS-CoV-2 by FDA under an Emergency Use Authorization (EUA).  This EUA will remain in effect (meaning this test can be used) for the duration of the COVID-19 declaration under Section 564(b)(1) of the Act, 21 U.S.C. section 360bbb-3(b)(1), unless the authorization is terminated or revoked sooner. Performed at [facility], [place] [address], [place], [place] [address]   Culture, blood (routine x 2)     Status: None (Preliminary result)   Collection Time: 08/12/19  5:55 PM   Specimen: BLOOD  Result Value Ref Range Status   Specimen Description BLOOD BLOOD LEFT HAND  Final   Special Requests   Final    BOTTLES DRAWN AEROBIC AND ANAEROBIC Blood Culture results may not be optimal due to an inadequate volume of blood received in culture bottles   Culture   Final    NO GROWTH 2 DAYS Performed at [facility], [place] [address], [place], [place] [address]    Report Status PENDING  Incomplete  Culture, blood (routine x 2)     Status: None (Preliminary result)   Collection Time: 08/13/19  6:54 AM   Specimen: BLOOD  Result Value Ref Range Status   Specimen Description BLOOD SITE NOT SPECIFIED  Final   Special Requests   Final    BOTTLES DRAWN AEROBIC AND ANAEROBIC Blood Culture adequate volume   Culture   Final    NO GROWTH 1 DAY Performed at [facility], [place] [address] [place],  [address]    Report Status PENDING  Incomplete         Radiology Studies: CT Head Wo Contrast  Result Date: 08/12/2019 CLINICAL DATA:  Head trauma.  Unwitnessed fall. EXAM: CT HEAD WITHOUT CONTRAST TECHNIQUE: Contiguous axial images were obtained from the base of the skull through the vertex without  intravenous contrast. COMPARISON:  January 12, 2014 FINDINGS: Brain: No subdural, epidural, or subarachnoid hemorrhage. Ventricles and sulci are prominent but stable. Cerebellum, brainstem, and basal cisterns are within normal limits. Chronic white matter changes are noted. No acute cortical ischemia or infarct. No mass effect or midline shift. Vascular: Calcified atherosclerosis in the intracranial carotids. Skull: Normal. Negative for fracture or focal lesion. Sinuses/Orbits: No acute finding. Other: None. IMPRESSION: 1. No acute intracranial abnormalities identified. Electronically Signed   By: Dorise Bullion III M.D   On: 08/12/2019 14:20   US RENAL  Result Date: 08/12/2019 CLINICAL DATA:  Renal injury. EXAM: RENAL / URINARY TRACT ULTRASOUND COMPLETE COMPARISON:  None. FINDINGS: Right Kidney: Renal measurements: 8.8 x 4.5 x 4.5 cm = volume: 82.3 mL. Increased cortical echogenicity. There may be slight perinephric fluid. No hydronephrosis.  Left Kidney: Renal measurements: 9.1 x 5.3 x 5.0 cm = volume: 125 mL. Increased cortical echogenicity. Bladder: Appears normal for degree of bladder distention. Other: None. IMPRESSION: Medical renal disease.  No acute abnormalities.  No hydronephrosis. Electronically Signed   By: Dorise Bullion III M.D   On: 08/12/2019 18:56   DG Pelvis Portable  Result Date: 08/13/2019 CLINICAL DATA:  Fall 1 week ago with progressive pelvic pain, initial encounter EXAM: PORTABLE PELVIS 1-2 VIEWS COMPARISON:  None. FINDINGS: Pelvic ring is intact. No acute fracture or dislocation is noted. Degenerative changes of the hip joints and lumbar spine are seen. Bilateral iliac stents are noted. IMPRESSION: No acute abnormality noted. Electronically Signed   By: Inez Catalina M.D.   On: 08/13/2019 02:25   DG Chest Port 1 View  Result Date: 08/12/2019 CLINICAL DATA:  79 year old female with weakness. EXAM: PORTABLE CHEST 1 VIEW COMPARISON:  Chest radiograph dated 08/07/2019. FINDINGS:  There is diffuse chronic interstitial coarsening and bronchitic changes. Faint bilateral lower lung field interstitial densities, likely chronic. Developing infiltrate is not excluded. No focal consolidation, large pleural effusion, or pneumothorax. Stable cardiomegaly. Atherosclerotic calcification of the aorta. Left pectoral AICD device. No acute osseous pathology. IMPRESSION: 1. No focal consolidation. 2. Chronic interstitial coarsening and bronchitic changes. Electronically Signed   By: Anner Crete M.D.   On: 08/12/2019 16:05   DG Knee Left Port  Result Date: 08/13/2019 CLINICAL DATA:  Fall 1 week ago with left knee pain, initial encounter EXAM: PORTABLE LEFT KNEE - 4 VIEW COMPARISON:  None. FINDINGS: No evidence of fracture, dislocation, or joint effusion. Meniscal calcifications are noted. Mild medial joint space narrowing is seen. IMPRESSION: Mild degenerative change without acute abnormality. Electronically Signed   By: Inez Catalina M.D.   On: 08/13/2019 02:27   DG Knee Right Port  Result Date: 08/13/2019 CLINICAL DATA:  Fall 1 week ago with right knee pain, initial encounter EXAM: PORTABLE RIGHT KNEE - 4 VIEW COMPARISON:  None. FINDINGS: No evidence of fracture, dislocation, or joint effusion. No evidence of arthropathy or other focal bone abnormality. Mild meniscal calcifications are seen. IMPRESSION: No acute abnormality noted. Electronically Signed   By: Inez Catalina M.D.   On: 08/13/2019 02:26   ECHOCARDIOGRAM COMPLETE  Result Date: 08/13/2019    ECHOCARDIOGRAM REPORT   Patient Name:   HIND CHESLER Date of Exam: 08/13/2019 Medical Rec #:  [id number]    Height:       63.0 in Accession #:    [id number]   Weight:       185.0 lb Date of Birth:  02-Jun-1940    BSA:          1.871 m Patient Age:    44 years     BP:           109/61 mmHg Patient Gender: F            HR:           70 bpm. Exam Location:  Inpatient Procedure: 2D Echo, Color Doppler and Cardiac Doppler Indications:    R55 Syncope   History:        Patient has prior history of Echocardiogram examinations, most                 recent 05/23/2018. Aortic Valve Disease. Recent fall.  Sonographer:    Merrie Roof RDCS Referring Phys: [id number] [place]  1. Left ventricular ejection fraction, by estimation, is 60 to 65%. The left ventricle  has normal function. The left ventricle has no regional wall motion abnormalities. There is moderate left ventricular hypertrophy. Left ventricular diastolic parameters were normal.  2. Right ventricular systolic function is normal. The right ventricular size is normal. There is severely elevated pulmonary artery systolic pressure.  3. Left atrial size was mildly dilated.  4. The mitral valve is grossly normal. No evidence of mitral valve regurgitation.  5. The aortic valve is tricuspid. Aortic valve regurgitation is mild. Mild aortic valve stenosis. Aortic valve area, by VTI measures 1.52 cm. Aortic valve mean gradient measures 11.5 mmHg. Aortic valve Vmax measures 2.50 m/s.  6. The inferior vena cava is dilated in size with <50% respiratory variability, suggesting right atrial pressure of 15 mmHg. Comparison(s): Changes from prior study are noted. LVEF 55-60%, mild AS - mean gradient 10 mmHg. FINDINGS  Left Ventricle: Left ventricular ejection fraction, by estimation, is 60 to 65%. The left ventricle has normal function. The left ventricle has no regional wall motion abnormalities. The left ventricular internal cavity size was normal in size. There is  moderate left ventricular hypertrophy. Left ventricular diastolic parameters were normal. Right Ventricle: The right ventricular size is normal. No increase in right ventricular wall thickness. Right ventricular systolic function is normal. There is severely elevated pulmonary artery systolic pressure. The tricuspid regurgitant velocity is 3.92 m/s, and with an assumed right atrial pressure of 15 mmHg, the estimated right ventricular systolic pressure  is 74.0 mmHg. Left Atrium: Left atrial size was mildly dilated. Right Atrium: Right atrial size was normal in size. Pericardium: There is no evidence of pericardial effusion. Mitral Valve: The mitral valve is grossly normal. No evidence of mitral valve regurgitation. Tricuspid Valve: The tricuspid valve is grossly normal. Tricuspid valve regurgitation is mild. Aortic Valve: The aortic valve is tricuspid. Aortic valve regurgitation is mild. Aortic regurgitation PHT measures 533 msec. Mild aortic stenosis is present. Aortic valve mean gradient measures 11.5 mmHg. Aortic valve peak gradient measures 25.0 mmHg. Aortic valve area, by VTI measures 1.52 cm. Pulmonic Valve: The pulmonic valve was grossly normal. Pulmonic valve regurgitation is trivial. Aorta: The aortic root and ascending aorta are structurally normal, with no evidence of dilitation. Venous: The inferior vena cava is dilated in size with less than 50% respiratory variability, suggesting right atrial pressure of 15 mmHg. IAS/Shunts: No atrial level shunt detected by color flow Doppler. Additional Comments: A pacer wire is visualized.  LEFT VENTRICLE PLAX 2D LVIDd:         4.80 cm     Diastology LVIDs:         3.40 cm     LV e' lateral:   9.79 cm/s LV PW:         1.20 cm     LV E/e' lateral: 9.8 LV IVS:        1.20 cm     LV e' medial:    8.49 cm/s LVOT diam:     2.00 cm     LV E/e' medial:  11.3 LV SV:         57 LV SV Index:   31 LVOT Area:     3.14 cm  LV Volumes (MOD) LV vol d, MOD A4C: 70.5 ml LV vol s, MOD A4C: 22.6 ml LV SV MOD A4C:     70.5 ml RIGHT VENTRICLE             IVC RV S prime:     12.10 cm/s  IVC diam: 2.70 cm TAPSE (  M-mode): 2.0 cm LEFT ATRIUM              Index LA diam:        4.80 cm  2.57 cm/m LA Vol (A2C):   120.0 ml 64.15 ml/m LA Vol (A4C):   69.6 ml  37.21 ml/m LA Biplane Vol: 91.2 ml  48.75 ml/m  AORTIC VALVE AV Area (Vmax):    1.31 cm AV Area (Vmean):   1.42 cm AV Area (VTI):     1.52 cm AV Vmax:           250.00 cm/s AV  Vmean:          155.000 cm/s AV VTI:            0.377 m AV Peak Grad:      25.0 mmHg AV Mean Grad:      11.5 mmHg LVOT Vmax:         104.00 cm/s LVOT Vmean:        70.100 cm/s LVOT VTI:          0.182 m LVOT/AV VTI ratio: 0.48 AI PHT:            533 msec  AORTA Ao Root diam: 3.50 cm Ao Asc diam:  3.45 cm MITRAL VALVE               TRICUSPID VALVE MV Area (PHT): 4.15 cm    TR Peak grad:   61.5 mmHg MV Decel Time: 183 msec    TR Vmax:        392.00 cm/s MV E velocity: 96.30 cm/s MV A velocity: 55.20 cm/s  SHUNTS MV E/A ratio:  1.74        Systemic VTI:  0.18 m                            Systemic Diam: 2.00 cm Lyman Bishop MD Electronically signed by Lyman Bishop MD Signature Date/Time: 08/13/2019/10:06:36 AM    Final    US Abdomen Limited RUQ  Result Date: 08/12/2019 CLINICAL DATA:  Abdominal pain. EXAM: ULTRASOUND ABDOMEN LIMITED RIGHT UPPER QUADRANT COMPARISON:  None. FINDINGS: Gallbladder: Surgically absent. Common bile duct: Diameter: 4.4 mm Liver: Increased echogenicity with no focal masses. Portal vein is patent on color Doppler imaging with normal direction of blood flow towards the liver. Other: None. IMPRESSION: 1. Previous cholecystectomy. 2. No biliary duct dilatation. 3. Probable hepatic steatosis. Electronically Signed   By: Dorise Bullion III M.D   On: 08/12/2019 18:55        Scheduled Meds: . albuterol  3 mL Inhalation See admin instructions  . aspirin EC  81 mg Oral Daily  . gabapentin  300 mg Oral BID  . levothyroxine  100 mcg Oral QAC breakfast  . montelukast  10 mg Oral Daily  . pravastatin  20 mg Oral QHS  . Rivaroxaban  15 mg Oral Q supper   Continuous Infusions:   LOS: 2 days    Time spent: 69min  Domenic Polite, MD [facility]  08/14/2019, 11:08 AM

## 2019-08-14 NOTE — Progress Notes (Signed)
Physical Therapy Treatment Patient Details Name: Katie [name] MRN: [id number] DOB: April 07, 1940 Today's Date: 08/14/2019    History of Present Illness Patient is 79 y/o female who presents s/p falls and confusion suspected likely from AKI, polypharmacy and decreased renal clearance of numerous meds including gabapentin and amitriptyline. PMH includes migraines, chronic systolic CHF, ICD, COPD, chronic pain, peripheral neuropathy, PVD, A-fib, CKD, CHF, COPD, CVA, CAD, pulmonary fibrosis and aortic aneurysm.    PT Comments    Patient seen for mobility progression. Pt reports being sore/painful "everywhere" and able to ambulate ~20 ft with RW and min guard-min A. Pt presents with generalized weakness and balance deficits. Continue to progress as tolerated with anticipated d/c to SNF for further skilled PT services.    Follow Up Recommendations  SNF;Supervision for mobility/OOB     Equipment Recommendations  None recommended by PT    Recommendations for Other Services       Precautions / Restrictions Precautions Precautions: Fall Precaution Comments: 3 falls in las 1-2 weeks Restrictions Weight Bearing Restrictions: No    Mobility  Bed Mobility Overal bed mobility: Needs Assistance Bed Mobility: Supine to Sit     Supine to sit: Mod assist Sit to supine: Max assist;HOB elevated   General bed mobility comments: assist to bring bilat LE/hips to EOB and to elevate trunk into sitting; cues for sequencing; pt assisted with bilat UE minimally due to c/o shoulder pain  Transfers Overall transfer level: Needs assistance Equipment used: Rolling walker (2 wheeled) Transfers: Sit to/from Stand Sit to Stand: Mod assist         General transfer comment: cues for safe hand placement; assist to power up into standing   Ambulation/Gait Ambulation/Gait assistance: Min guard;Min assist Gait Distance (Feet): (~20 ft total ) Assistive device: Rolling walker (2 wheeled) Gait  Pattern/deviations: Decreased step length - right;Decreased step length - left;Shuffle;Trunk flexed;Wide base of support Gait velocity: decreased   General Gait Details: cues for increased bilat step lengths; assist to steady    Stairs             Wheelchair Mobility    Modified Rankin (Stroke Patients Only)       Balance Overall balance assessment: Needs assistance;History of Falls Sitting-balance support: Feet supported;No upper extremity supported Sitting balance-Leahy Scale: Fair     Standing balance support: Bilateral upper extremity supported;During functional activity Standing balance-Leahy Scale: Poor Standing balance comment: Requires UE support in standing.                            Cognition Arousal/Alertness: Awake/alert Behavior During Therapy: WFL for tasks assessed/performed Overall Cognitive Status: Impaired/Different from baseline Area of Impairment: Following commands;Safety/judgement;Awareness;Problem solving;Memory                     Memory: Decreased short-term memory;Decreased recall of precautions Following Commands: Follows one step commands consistently;Follows one step commands with increased time Safety/Judgement: Decreased awareness of safety;Decreased awareness of deficits Awareness: Emergent Problem Solving: Slow processing;Requires verbal cues;Decreased initiation;Difficulty sequencing;Requires tactile cues General Comments: oriented, follows commands with increased time to process and initate; decreased safety awareness       Exercises      General Comments General comments (skin integrity, edema, etc.): RN provided pain medication during session       Pertinent Vitals/Pain Pain Assessment: Faces Faces Pain Scale: Hurts even more Pain Location: "everywhere" Pain Descriptors / Indicators: Grimacing;Guarding;Sore Pain Intervention(s): Limited activity within patient's tolerance;Monitored  during  session;Repositioned    Home Living Family/patient expects to be discharged to:: Private residence Living Arrangements: Spouse/significant other Available Help at Discharge: Family;Available 24 hours/day Type of Home: House Home Access: Stairs to enter Entrance Stairs-Rails: Right;Left;Can reach both Home Layout: One level Home Equipment: Environmental consultant - 2 wheels;Walker - 4 wheels;Cane - single point;Bedside commode;Grab bars - tub/shower      Prior Function Level of Independence: Independent      Comments: Does ADLs, IADLs, reports 2 falls. Drives. Walks independently. Reports 3 falls in last 6 months. Attributes this to having lots of close people to her die recently.   PT Goals (current goals can now be found in the care plan section) Acute Rehab PT Goals Patient Stated Goal: less pain  Progress towards PT goals: Progressing toward goals    Frequency    Min 3X/week      PT Plan Current plan remains appropriate    Co-evaluation              AM-PAC PT "6 Clicks" Mobility   Outcome Measure  Help needed turning from your back to your side while in a flat bed without using bedrails?: A Little Help needed moving from lying on your back to sitting on the side of a flat bed without using bedrails?: A Lot Help needed moving to and from a bed to a chair (including a wheelchair)?: A Lot Help needed standing up from a chair using your arms (e.g., wheelchair or bedside chair)?: A Lot Help needed to walk in hospital room?: A Little Help needed climbing 3-5 steps with a railing? : A Little 6 Click Score: 15    End of Session Equipment Utilized During Treatment: Gait belt Activity Tolerance: Patient limited by fatigue;Patient limited by pain Patient left: in chair;with call bell/phone within reach;with chair alarm set Nurse Communication: Mobility status PT Visit Diagnosis: Unsteadiness on feet (R26.81);Muscle weakness (generalized) (M62.81);Difficulty in walking, not elsewhere  classified (R26.2);Pain Pain - part of body: (everywhere)     Time: 1607-3710 PT Time Calculation (min) (ACUTE ONLY): 29 min  Charges:  $Gait Training: 23-37 mins                     Earney Navy, PTA Acute Rehabilitation Services Pager: [phone] Office: [phone]     Darliss Cheney 08/14/2019, 2:38 PM

## 2019-08-14 NOTE — TOC Initial Note (Signed)
Transition of Care [facility]) - Initial/Assessment Note    Patient Details  Name: Katie [name] MRN: [id number] Date of Birth: 09-21-1940  [facility]) CM/SW Contact:    Kirstie Peri, [place] Work Phone Number: 08/14/2019, 4:05 PM  Clinical Narrative:                 MSW Intern spoke with pt, daughter at bedside. Pt was reluctant to go to SNF, but acknowledged that she needs to go for safety. Daughter also wants SNF placement. Pt noted that she had been to [facility] before and would be ok with going back there. Pt has had both [employer] shots. They agreed to be faxed out to facilities close to the hospital. FL2 and faxout completed, will follow up with bed offers.  Expected Discharge Plan: Skilled Nursing Facility Barriers to Discharge: Continued Medical Work up, SNF Pending bed offer   Patient Goals and CMS Choice Patient states their goals for this hospitalization and ongoing recovery are:: Pt states they are agreeable to SNF placement, if it is short term. CMS Medicare.gov Compare Post Acute Care list provided to:: Patient Choice offered to / list presented to : Patient  Expected Discharge Plan and Services Expected Discharge Plan: [place] In-house Referral: Clinical Social Work Discharge Planning Services: CM Consult   Living arrangements for the past 2 months: [place]                                      Prior Living Arrangements/Services Living arrangements for the past 2 months: Single Family Home Lives with:: Spouse Patient language and need for interpreter reviewed:: Yes Do you feel safe going back to the place where you live?: Yes      Need for Family Participation in Patient Care: No (Comment) Care giver support system in place?: Yes (comment)   Criminal Activity/Legal Involvement Pertinent to Current Situation/Hospitalization: No - Comment as needed  Activities of Daily Living      Permission  Sought/Granted Permission sought to share information with : Family Supports Permission granted to share information with : Yes, Verbal Permission Granted  Share Information with NAME: [name]     Permission granted to share info w Relationship: Daughter  Permission granted to share info w Contact Information: [phone]  Emotional Assessment Appearance:: Appears stated age Attitude/Demeanor/Rapport: Engaged Affect (typically observed): Frustrated Orientation: : Oriented to Self, Oriented to Place, Oriented to Situation Alcohol / Substance Use: Not Applicable Psych Involvement: No (comment)  Admission diagnosis:  Kidney injury [S37.009A] Acute encephalopathy [G93.40] AKI (acute kidney injury) ([place]) [N17.9] Frequent falls [R29.6] Abdominal pain [R10.9] Poisoning by digoxin, accidental or unintentional, initial encounter [T46.0X1A] Patient Active Problem List   Diagnosis Date Noted  . Acute encephalopathy 08/12/2019  . Senile purpura ([place]) 05/30/2019  . Rheumatoid arthritis involving multiple sites ([place]) 05/30/2019  . Obesity (BMI 30.0-34.9) 05/30/2019  . Mononeuropathy 11/23/2018  . 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 COPD (chronic obstructive pulmonary disease) with chronic bronchitis ([place]) 02/02/2018  . Aortic atherosclerosis ([employer]) 10/17/2017  . PAH (pulmonary artery hypertension) ([place]) 06/23/2017  . Gout 03/20/2017  . Abnormal glucose 09/19/2015  . AAA (abdominal aortic aneurysm) without rupture ([place]) 04/22/2015  . PVD (peripheral vascular disease) with claudication ([place]) 10/16/2013  . CKD (chronic kidney disease) stage 3, GFR 30-59 ml/min 06/08/2013  . Hyperlipidemia, mixed 06/08/2013  . Pulmonary Fibrosis sequellae of  Amiodarone 06/08/2013  . Long term current use of anticoagulant therapy 04/23/2013  . Vitamin D deficiency 02/15/2013  . Hypothyroidism   . Osteopenia   . Chronic combined systolic and diastolic CHF (congestive  heart failure) ([place]) 11/25/2008  . Essential hypertension 11/22/2008  . Atherosclerosis of native coronary artery of native heart without angina pectoris 11/22/2008  . Atrial fibrillation ([place]) 11/22/2008  . Chronic obstructive pulmonary disease with hypoxia ([place]) 11/22/2008  . Gastroesophageal reflux disease without esophagitis 11/22/2008   PCP:  [name], MD Pharmacy:   [id number] - [place], [place] [address] [place] [address] Phone: [phone] Fax: [place] [facility] [address] [place] [address] [place] [address] Phone: [phone] Fax: [phone]     Social Determinants of Health (SDOH) Interventions    Readmission Risk Interventions No flowsheet data found.

## 2019-08-14 NOTE — Evaluation (Signed)
Occupational Therapy Evaluation Patient Details Name: Katie [name] MRN: [id number] DOB: 1940/07/05 Today's Date: 08/14/2019    History of Present Illness Patient is 79 y/o female who presents s/p falls and confusion suspected likely from AKI, polypharmacy and decreased renal clearance of numerous meds including gabapentin and amitriptyline. PMH includes migraines, chronic systolic CHF, ICD, COPD, chronic pain, peripheral neuropathy, PVD, A-fib, CKD, CHF, COPD, CVA, CAD, pulmonary fibrosis and aortic aneurysm.   Clinical Impression   PTA patient independent with ADLs, mobility. Admitted for above and limited by problem list below, including impaired balance, generalized weakness, pain "all over pins and needles", decreased functional use of dominant R UE due to shoulder pain, and impaired cognition.  Patient oriented today, follows simple commands with increased time to process and initiate, but difficulty noted with problem solving, safety awareness. She currently requires min-max assist for ADLs, mod assist for transfers and max assist for bed mobility.  She will benefit from further OT services while admitted and after dc at SNF level to optimize independence and safety with ADls, IADls and mobility prior to dc home.     Follow Up Recommendations  SNF;Supervision/Assistance - 24 hour    Equipment Recommendations  3 in 1 bedside commode    Recommendations for Other Services       Precautions / Restrictions Precautions Precautions: Fall Precaution Comments: 3 falls in las 1-2 weeks Restrictions Weight Bearing Restrictions: No      Mobility Bed Mobility Overal bed mobility: Needs Assistance Bed Mobility: Sit to Supine       Sit to supine: Max assist;HOB elevated   General bed mobility comments: support for BLEs to supine and guiding support of trunk  Transfers Overall transfer level: Needs assistance Equipment used: Rolling walker (2 wheeled) Transfers: Sit to/from Stand Sit  to Stand: Mod assist         General transfer comment: to power up and steady, cueing for hand placement and technique     Balance Overall balance assessment: Needs assistance;History of Falls Sitting-balance support: Feet supported;No upper extremity supported Sitting balance-Leahy Scale: Fair     Standing balance support: Bilateral upper extremity supported;During functional activity Standing balance-Leahy Scale: Poor Standing balance comment: Requires UE support in standing.                           ADL either performed or assessed with clinical judgement   ADL Overall ADL's : Needs assistance/impaired     Grooming: Set up;Sitting Grooming Details (indicate cue type and reason): assist to open toothpaste and squeeze fixident on dentures Upper Body Bathing: Minimal assistance;Sitting   Lower Body Bathing: Sit to/from stand;Maximal assistance   Upper Body Dressing : Moderate assistance;Sitting   Lower Body Dressing: Maximal assistance;Sit to/from stand   Toilet Transfer: Moderate assistance;Stand-pivot;RW Toilet Transfer Details (indicate cue type and reason): simulated from recliner          Functional mobility during ADLs: Moderate assistance;Rolling walker;Cueing for safety;Cueing for sequencing General ADL Comments: pt limited by pain, decreased activity tolerance/weakness, body habitus, and cognition     Vision   Vision Assessment?: No apparent visual deficits     Perception     Praxis      Pertinent Vitals/Pain Pain Assessment: Faces Faces Pain Scale: Hurts whole lot Pain Location: generalized "from my shoulders to my toes" bilaterally Pain Descriptors / Indicators: Discomfort;Pins and needles Pain Intervention(s): Limited activity within patient's tolerance;Monitored during session;Repositioned     Hand Dominance  Right   Extremity/Trunk Assessment Upper Extremity Assessment Upper Extremity Assessment: Generalized weakness;RUE  deficits/detail RUE Deficits / Details: reports shoulder pain  RUE: Unable to fully assess due to pain RUE Coordination: decreased gross motor   Lower Extremity Assessment Lower Extremity Assessment: Defer to PT evaluation       Communication Communication Communication: No difficulties   Cognition Arousal/Alertness: Awake/alert Behavior During Therapy: WFL for tasks assessed/performed Overall Cognitive Status: Impaired/Different from baseline Area of Impairment: Following commands;Safety/judgement;Awareness;Problem solving;Memory                     Memory: Decreased short-term memory;Decreased recall of precautions Following Commands: Follows one step commands consistently;Follows one step commands with increased time;Follows multi-step commands inconsistently Safety/Judgement: Decreased awareness of safety;Decreased awareness of deficits Awareness: Emergent Problem Solving: Slow processing;Requires verbal cues;Decreased initiation;Difficulty sequencing;Requires tactile cues General Comments: oriented, follows commands with increased time to process and initate; decreased safety awareness    General Comments  RN provided pain medication during session     Exercises     Shoulder Instructions      Home Living Family/patient expects to be discharged to:: Private residence Living Arrangements: Spouse/significant other Available Help at Discharge: Family;Available 24 hours/day Type of Home: House Home Access: Stairs to enter [employer] of Steps: 5 + 3 Entrance Stairs-Rails: Right;Left;Can reach both Home Layout: One level     Bathroom Shower/Tub: Teacher, early years/pre: Standard     Home Equipment: Environmental consultant - 2 wheels;Walker - 4 wheels;Cane - single point;Bedside commode;Grab bars - tub/shower          Prior Functioning/Environment Level of Independence: Independent        Comments: Does ADLs, IADLs, reports 2 falls. Drives. Walks  independently. Reports 3 falls in last 6 months. Attributes this to having lots of close people to her die recently.        OT Problem List: Decreased strength;Decreased activity tolerance;Impaired balance (sitting and/or standing);Decreased cognition;Decreased coordination;Decreased safety awareness;Decreased knowledge of use of DME or AE;Decreased knowledge of precautions;Pain;Impaired UE functional use;Obesity      OT Treatment/Interventions: Self-care/ADL training;DME and/or AE instruction;Therapeutic exercise;Therapeutic activities;Patient/family education;Balance training;Energy conservation;Cognitive remediation/compensation    OT Goals(Current goals can be found in the care plan section) Acute Rehab OT Goals Patient Stated Goal: less pain  OT Goal Formulation: With patient Time For Goal Achievement: 08/28/19 Potential to Achieve Goals: Good  OT Frequency: Min 2X/week   Barriers to D/C:            Co-evaluation              AM-PAC OT "6 Clicks" Daily Activity     Outcome Measure Help from another person eating meals?: A Little Help from another person taking care of personal grooming?: A Little Help from another person toileting, which includes using toliet, bedpan, or urinal?: A Lot Help from another person bathing (including washing, rinsing, drying)?: A Lot Help from another person to put on and taking off regular upper body clothing?: A Little Help from another person to put on and taking off regular lower body clothing?: A Lot 6 Click Score: 15   End of Session Equipment Utilized During Treatment: Gait belt;Rolling walker Nurse Communication: Mobility status  Activity Tolerance: Patient tolerated treatment well Patient left: in bed;with call bell/phone within reach;with bed alarm set  OT Visit Diagnosis: Other abnormalities of gait and mobility (R26.89);Muscle weakness (generalized) (M62.81);Pain;Other symptoms and signs involving cognitive function;History of  falling (Z91.81) Pain - part  of body: (generalized)                Time: 2694-8546 OT Time Calculation (min): 23 min Charges:  OT General Charges $OT Visit: 1 Visit OT Evaluation $OT Eval Moderate Complexity: 1 Mod OT Treatments $Self Care/Home Management : 8-22 mins  Jolaine Artist, OT Acute Rehabilitation Services Pager [phone] Office [phone]   Delight Stare 08/14/2019, 1:13 PM

## 2019-08-14 NOTE — NC FL2 (Signed)
[place] LEVEL OF CARE SCREENING TOOL     IDENTIFICATION  Patient Name: Katie [name] Birthdate: [birth year] Sex: female Admission Date (Current Location): 08/12/2019  [facility] and [place] Number:  Herbalist and Address:  [facility], [place] [address] [place], [place] [address]      Provider Number: [id number]  Attending Physician Name and Address:  Domenic Polite, MD  Relative Name and Phone Number:  [name] [phone]    Current Level of Care: Hospital Recommended Level of Care: [place] Prior Approval Number:    Date Approved/Denied:   PASRR Number: [id number]  Discharge Plan: SNF    Current Diagnoses: Patient Active Problem List   Diagnosis Date Noted  . Acute encephalopathy 08/12/2019  . Senile purpura ([place]) 05/30/2019  . Rheumatoid arthritis involving multiple sites ([place]) 05/30/2019  . Obesity (BMI 30.0-34.9) 05/30/2019  . Mononeuropathy 11/23/2018  . 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 COPD (chronic obstructive pulmonary disease) with chronic bronchitis ([place]) 02/02/2018  . Aortic atherosclerosis ([place]) 10/17/2017  . PAH (pulmonary artery hypertension) ([place]) 06/23/2017  . Gout 03/20/2017  . Abnormal glucose 09/19/2015  . AAA (abdominal aortic aneurysm) without rupture ([place]) 04/22/2015  . PVD (peripheral vascular disease) with claudication ([place]) 10/16/2013  . CKD (chronic kidney disease) stage 3, GFR 30-59 ml/min 06/08/2013  . Hyperlipidemia, mixed 06/08/2013  . 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 Long term current use of anticoagulant therapy 04/23/2013  . Vitamin D deficiency 02/15/2013  . Hypothyroidism   . Osteopenia   . Chronic combined systolic and diastolic CHF (congestive heart failure) ([place]) 11/25/2008  . Essential hypertension 11/22/2008  . Atherosclerosis of native coronary artery of native heart without angina pectoris  11/22/2008  . Atrial fibrillation ([place]) 11/22/2008  . Chronic obstructive pulmonary disease with hypoxia ([place]) 11/22/2008  . Gastroesophageal reflux disease without esophagitis 11/22/2008    Orientation RESPIRATION BLADDER Height & Weight     Self, Situation, Place  Normal External catheter Weight:   Height:     BEHAVIORAL SYMPTOMS/MOOD NEUROLOGICAL BOWEL NUTRITION STATUS      Incontinent Diet(See Discharge Summary)  AMBULATORY STATUS COMMUNICATION OF NEEDS Skin   Limited Assist Verbally Skin abrasions(Abrasion, Right knee)                       Personal Care Assistance Level of Assistance  Bathing, Feeding, Dressing Bathing Assistance: Maximum assistance Feeding assistance: Independent Dressing Assistance: Maximum assistance     Functional Limitations Info  Sight, Hearing, Speech Sight Info: Adequate Hearing Info: Adequate Speech Info: Adequate    SPECIAL CARE FACTORS FREQUENCY  PT (By licensed PT), OT (By licensed OT)     PT Frequency: 5x week OT Frequency: 5x week            Contractures Contractures Info: Not present    Additional Factors Info  Code Status, Allergies Code Status Info: Full Allergies Info: Amiodarone, Diovan, Doxycycline, Flexeril, Keflex, Verapamil, Codeine.           Current Medications (08/14/2019):  This is the current hospital active medication list Current Facility-Administered Medications  Medication Dose Route Frequency Provider Last Rate Last Admin  . acetaminophen (TYLENOL) tablet 650 mg  650 mg Oral Q6H PRN George Hugh, MD   650 mg at 08/14/19 0437   Or  . acetaminophen (TYLENOL) suppository 650 mg  650 mg Rectal Q6H PRN George Hugh, MD      . albuterol (  PROVENTIL) (2.5 MG/3ML) 0.083% nebulizer solution 3 mL  3 mL Inhalation See admin instructions George Hugh, MD      . aspirin EC tablet 81 mg  81 mg Oral Daily George Hugh, MD   81 mg at 08/14/19 0944  . diclofenac Sodium (VOLTAREN) 1 % topical gel 2 g  2 g  Topical Q6H PRN Domenic Polite, MD   2 g at 08/14/19 1121  . gabapentin (NEURONTIN) tablet 300 mg  300 mg Oral BID Domenic Polite, MD   300 mg at 08/14/19 1215  . levothyroxine (SYNTHROID) tablet 100 mcg  100 mcg Oral QAC breakfast George Hugh, MD   100 mcg at 08/14/19 0603  . montelukast (SINGULAIR) tablet 10 mg  10 mg Oral Daily George Hugh, MD   10 mg at 08/14/19 0945  . pravastatin (PRAVACHOL) tablet 20 mg  20 mg Oral QHS George Hugh, MD   20 mg at 08/13/19 2118  . Rivaroxaban (XARELTO) tablet 15 mg  15 mg Oral Q supper George Hugh, MD   15 mg at 08/13/19 1613  . traMADol (ULTRAM) tablet 50 mg  50 mg Oral Q6H PRN Domenic Polite, MD   50 mg at 08/14/19 1121     Discharge Medications: Please see discharge summary for a list of discharge medications.  Relevant Imaging Results:  Relevant Lab Results:   Additional Information SS# [id number]  Kirstie Peri, [place] Work

## 2019-08-15 DIAGNOSIS — G9340 Encephalopathy, unspecified: Secondary | ICD-10-CM

## 2019-08-15 LAB — CBC
HCT: 33.5 % — ABNORMAL LOW (ref 36.0–46.0)
Hemoglobin: 11 g/dL — ABNORMAL LOW (ref 12.0–15.0)
MCH: 28.5 pg (ref 26.0–34.0)
MCHC: 32.8 g/dL (ref 30.0–36.0)
MCV: 86.8 fL (ref 80.0–100.0)
Platelets: 234 10*3/uL (ref 150–400)
RBC: 3.86 MIL/uL — ABNORMAL LOW (ref 3.87–5.11)
RDW: 16.8 % — ABNORMAL HIGH (ref 11.5–15.5)
WBC: 13.3 10*3/uL — ABNORMAL HIGH (ref 4.0–10.5)
nRBC: 0.2 % (ref 0.0–0.2)

## 2019-08-15 LAB — BASIC METABOLIC PANEL
Anion gap: 14 (ref 5–15)
BUN: 43 mg/dL — ABNORMAL HIGH (ref 8–23)
CO2: 19 mmol/L — ABNORMAL LOW (ref 22–32)
Calcium: 7.7 mg/dL — ABNORMAL LOW (ref 8.9–10.3)
Chloride: 93 mmol/L — ABNORMAL LOW (ref 98–111)
Creatinine, Ser: 2.02 mg/dL — ABNORMAL HIGH (ref 0.44–1.00)
GFR calc Af Amer: 27 mL/min — ABNORMAL LOW (ref >60)
GFR calc non Af Amer: 23 mL/min — ABNORMAL LOW (ref >60)
Glucose, Bld: 90 mg/dL (ref 70–99)
Potassium: 4 mmol/L (ref 3.5–5.1)
Sodium: 126 mmol/L — ABNORMAL LOW (ref 135–145)

## 2019-08-15 MED ORDER — ONDANSETRON HCL 4 MG/2ML IJ SOLN: 4.0000 mg | Freq: Four times a day (QID) | INTRAMUSCULAR | Status: DC | PRN

## 2019-08-15 MED ORDER — TORSEMIDE 20 MG PO TABS
20.0000 mg | ORAL_TABLET | Freq: Two times a day (BID) | ORAL | Status: DC
  Administered 2019-08-15 – 2019-08-16 (×2): 20 mg via ORAL
  Filled 2019-08-15 (×2): qty 1

## 2019-08-15 MED ORDER — MELATONIN 5 MG PO TABS
10.0000 mg | ORAL_TABLET | Freq: Every evening | ORAL | Status: DC | PRN
  Administered 2019-08-16 – 2019-08-17 (×2): 10 mg via ORAL
  Filled 2019-08-15 (×2): qty 2

## 2019-08-15 MED ORDER — POTASSIUM CHLORIDE CRYS ER 20 MEQ PO TBCR
20.0000 meq | EXTENDED_RELEASE_TABLET | Freq: Every day | ORAL | Status: DC
  Administered 2019-08-15 – 2019-08-18 (×4): 20 meq via ORAL
  Filled 2019-08-15 (×4): qty 1

## 2019-08-15 NOTE — Plan of Care (Signed)
  Problem: Safety: Goal: Ability to remain free from injury will improve Outcome: Progressing   

## 2019-08-15 NOTE — TOC Progression Note (Signed)
Transition of Care [facility]) - Progression Note    Patient Details  Name: Katie [name] MRN: [id number] Date of Birth: 03-26-1940  Transition of Care [facility]) CM/SW [place], [place] Work Phone Number: 08/15/2019, 12:53 PM  Clinical Narrative:    MSW Intern called pt's daughter Katie [name] to give bed choices. She advised that her niece was in the room and that they had questions and concerns. MSW Intern spoke with granddaughter, Katie [name], daughter, Katie [name], and pt. Pt was once again hesitant to go to SNF, but acknowledged that she needed to go. Family chose [facility], Will start Insurance auth and update the facility. Dr. Ree Kida that family would like to speak with him. SW will continue to follow.    Expected Discharge Plan: Skilled Nursing Facility Barriers to Discharge: Continued Medical Work up, SNF Pending bed offer  Expected Discharge Plan and Services Expected Discharge Plan: [place] In-house Referral: Clinical Social Work Discharge Planning Services: CM Consult   Living arrangements for the past 2 months: Single Family Home                                       Social Determinants of Health (SDOH) Interventions    Readmission Risk Interventions No flowsheet data found.

## 2019-08-15 NOTE — Progress Notes (Addendum)
PROGRESS NOTE    Katie [name]  IPJ:[id number] DOB: 11/30/1940 DOA: 08/12/2019 PCP: [name], MD   Brief Narrative: Patient is a 79 year old female with history of migraines, chronic A. fib, chronic systolic congestive heart failure, nonischemic cardiomyopathy with improved ejection fraction, status post ICD, COPD, chronic pain syndrome, peripheral neuropathy, peripheral vascular disease who presented to the emergency department with complaints of falls, increased confusion.  She had 3 unwitnessed falls in the last 1 to 2 weeks, no loss of consciousness.  On presentation she had AKI with creatinine of 2.7,leukocytosis.  Also found to have elevated digoxin level.  PT evaluated her and recommended skilled nursing  facility placement.  Currently waiting for placement.  Assessment & Plan:   Active Problems:   Acute encephalopathy   Falls/confusion: Multiple etiology suspected.  Could be from AKI, polypharmacy.  She was on gabapentin, baclofen, Benadryl, tizanidine.  Pacemaker interrogation was done with no abnormal findings.  2D echocardiogram showed normal ejection fraction.  Exam is nonfocal.  CT head did not show any acute findings.  Mental status has improved considerably.  Continues to complain of pain from her fall.  She has severe neuropathy so gabapentin has been  restarted at low-dose.  PT/OT evaluation completed and  recommended skilled nursing facility placement.  Social worker consulted and following.  AKI on CKD stage IIIb: Baseline creatinine around 1.3-1.5.  Creatinine was 2.8 on presentation, suspected to be from overdiuresis.  She was hydrated with 3 L of fluid in the emergency department.  IV fluids have been stopped.  She was on torsemide and metolazone at home.  Will restart torsemide today.  Renal ultrasound did not show any hydronephrosis.  History of chronic systolic/diastolic CHF with biventricular ICD: Recent echocardiogram showed improvement in ejection fraction of  60%.  Discussed with cardiology, pacemaker interrogation done ,result was found to be normal.  Restart torsemide.  Metolazone can be stopped.  Digoxin was elevated on presentation.  Will hold given improvement in the ejection fraction.  Hyponatremia: Asymptomatic.Sodium dropped to 126 today, could be associated  with hypervolemic hyponatremia from congestive heart failure.  Will resume torsemide  COPD/chronic respiratory failure: On home oxygen at night.  Currently stable  Leukocytosis: Improving.  Most likely reactive.  Continue to monitor  Mild hyperbilirubinemia: Most likely secondary to fatty liver disease.  Right upper quadrant ultrasound is unremarkable.  Chronic A. fib: Rate is controlled.  Continue Xarelto  History of rheumatoid arthritis/gout: On leflunomide at home.  Will resume on discharge.          DVT prophylaxis: Xarelto Code Status: Full Family Communication: Granddaughter  present at the bedside Status is: Inpatient  Remains inpatient appropriate because:Unsafe d/c plan   Dispo: The patient is from: Home              Anticipated d/c is to: SNF              Anticipated d/c date is: 1 day              Patient currently is medically stable to d/c.    Waiting for bed at a skilled nursing facility  Consultants: None  Procedures: None  Antimicrobials:  Anti-infectives (From admission, onward)   None      Subjective: Patient seen and examined at the bedside this morning.  Hemodynamically stable.  Comfortable.  Denies any complaints like shortness of breath or cough or abdominal pain.  Eager to be discharged.  Objective: Vitals:   08/14/19 2045 08/14/19 2338  08/15/19 0416 08/15/19 0802  BP:  (!) 125/59 (!) 101/58 107/63  Pulse:  68 70 70  Resp:  20 16 18   Temp:  (!) 97.4 F (36.3 C) (!) 97.3 F (36.3 C) 97.6 F (36.4 C)  TempSrc:  Oral Oral Oral  SpO2: 96% 95% 90% 94%    Intake/Output Summary (Last 24 hours) at 08/15/2019 1019 Last data filed at  08/14/2019 2358 Gross per 24 hour  Intake 120 ml  Output 300 ml  Net -180 ml   There were no vitals filed for this visit.  Examination:  General exam: Appears calm and comfortable ,Not in distress,average built HEENT:PERRL,Oral mucosa moist, Ear/Nose normal on gross exam Respiratory system: Bilateral equal air entry, normal vesicular breath sounds, no wheezes or crackles  Cardiovascular system: Afib. No JVD, murmurs, rubs, gallops or clicks. No pedal edema.pacemaker Gastrointestinal system: Abdomen is nondistended, soft and nontender. No organomegaly or masses felt. Normal bowel sounds heard. Central nervous system: Alert and oriented. No focal neurological deficits. Extremities: No edema, no clubbing ,no cyanosis, distal peripheral pulses palpable. Skin: No rashes, lesions or ulcers,no icterus ,no pallor   Data Reviewed: I have personally reviewed following labs and imaging studies  CBC: Recent Labs  Lab 08/12/19 1349 08/13/19 0654 08/14/19 0719 08/15/19 0657  WBC 20.3* 19.4* 16.8* 13.3*  HGB 11.6* 11.0* 11.0* 11.0*  HCT 37.2 33.8* 33.7* 33.5*  MCV 92.1 88.5 86.6 86.8  PLT 258 218 226 889   Basic Metabolic Panel: Recent Labs  Lab 08/12/19 1349 08/13/19 0654 08/14/19 0719 08/15/19 0657  NA 132* 131* 133* 126*  K 4.5 4.1 4.0 4.0  CL 92* 95* 97* 93*  CO2 21* 21* 23 19*  GLUCOSE 75 86 91 90  BUN 46* 45* 42* 43*  CREATININE 2.78* 2.49* 2.23* 2.02*  CALCIUM 9.1 7.2* 7.5* 7.7*   GFR: Estimated Creatinine Clearance: 23.9 mL/min (A) (by C-G formula based on SCr of 2.02 mg/dL (H)). Liver Function Tests: Recent Labs  Lab 08/12/19 1349 08/13/19 0654  AST 27 24  ALT 19 17  ALKPHOS 59 58  BILITOT 2.5* 2.7*  PROT 6.8 5.1*  ALBUMIN 3.2* 2.3*   No results for input(s): LIPASE, AMYLASE in the last 168 hours. No results for input(s): AMMONIA in the last 168 hours. Coagulation Profile: No results for input(s): INR, PROTIME in the last 168 hours. Cardiac  Enzymes: Recent Labs  Lab 08/13/19 0654  CKTOTAL 50   BNP (last 3 results) No results for input(s): PROBNP in the last 8760 hours. HbA1C: No results for input(s): HGBA1C in the last 72 hours. CBG: No results for input(s): GLUCAP in the last 168 hours. Lipid Profile: No results for input(s): CHOL, HDL, LDLCALC, TRIG, CHOLHDL, LDLDIRECT in the last 72 hours. Thyroid Function Tests: Recent Labs    08/12/19 1755  TSH 1.206   Anemia Panel: No results for input(s): VITAMINB12, FOLATE, FERRITIN, TIBC, IRON, RETICCTPCT in the last 72 hours. Sepsis Labs: Recent Labs  Lab 08/12/19 1349  PROCALCITON 1.28    Recent Results (from the past 240 hour(s))  SARS Coronavirus 2 by RT PCR (hospital order, performed in [facility] hospital lab) Nasopharyngeal Nasopharyngeal Swab     Status: None   Collection Time: 08/12/19  4:44 PM   Specimen: Nasopharyngeal Swab  Result Value Ref Range Status   SARS Coronavirus 2 NEGATIVE NEGATIVE Final    Comment: (NOTE) SARS-CoV-2 target nucleic acids are NOT DETECTED. The SARS-CoV-2 RNA is generally detectable in upper and lower respiratory specimens  during the acute phase of infection. The lowest concentration of SARS-CoV-2 viral copies this assay can detect is 250 copies / mL. A negative result does not preclude SARS-CoV-2 infection and should not be used as the sole basis for treatment or other patient management decisions.  A negative result may occur with improper specimen collection / handling, submission of specimen other than nasopharyngeal swab, presence of viral mutation(s) within the areas targeted by this assay, and inadequate number of viral copies (<250 copies / mL). A negative result must be combined with clinical observations, patient history, and epidemiological information. Fact Sheet for Patients:   StrictlyIdeas.no Fact Sheet for Healthcare Providers: BankingDealers.co.za This test is  not yet approved or cleared  by the [place] FDA and has been authorized for detection and/or diagnosis of SARS-CoV-2 by FDA under an Emergency Use Authorization (EUA).  This EUA will remain in effect (meaning this test can be used) for the duration of the COVID-19 declaration under Section 564(b)(1) of the Act, 21 U.S.C. section 360bbb-3(b)(1), unless the authorization is terminated or revoked sooner. Performed at [facility], [place] [address] [place], [place] [address]   Culture, blood (routine x 2)     Status: None (Preliminary result)   Collection Time: 08/12/19  5:55 PM   Specimen: BLOOD  Result Value Ref Range Status   Specimen Description BLOOD BLOOD LEFT HAND  Final   Special Requests   Final    BOTTLES DRAWN AEROBIC AND ANAEROBIC Blood Culture results may not be optimal due to an inadequate volume of blood received in culture bottles   Culture   Final    NO GROWTH 3 DAYS Performed at [facility], [place] [address], [place], [place] [address]    Report Status PENDING  Incomplete  Culture, blood (routine x 2)     Status: None (Preliminary result)   Collection Time: 08/13/19  6:54 AM   Specimen: BLOOD  Result Value Ref Range Status   Specimen Description BLOOD SITE NOT SPECIFIED  Final   Special Requests   Final    BOTTLES DRAWN AEROBIC AND ANAEROBIC Blood Culture adequate volume   Culture   Final    NO GROWTH 2 DAYS Performed at [facility], [address], [place], [place] [address]    Report Status PENDING  Incomplete         Radiology Studies: No results found.      Scheduled Meds: . albuterol  3 mL Inhalation See admin instructions  . aspirin EC  81 mg Oral Daily  . gabapentin  300 mg Oral BID  . levothyroxine  100 mcg Oral QAC breakfast  . montelukast  10 mg Oral Daily  . pravastatin  20 mg Oral QHS  . Rivaroxaban  15 mg Oral Q supper   Continuous Infusions:   LOS: 3 days    Time spent: 25 mins.More than 50% of that time  was spent in counseling and/or coordination of care.      Shelly Coss, MD [facility] P6/04/2019, 10:19 AM

## 2019-08-15 NOTE — Consult Note (Signed)
   [facility] Inpatient Consult   08/15/2019  Katie [name] 1941-03-08 [id number]  [facility] Patient:  [name] [employer]  [facility] Status: Active  Patient is currently active with [place] [facility] for chronic disease management services.  Patient has been engaged by a [employer].  Our community based plan of care has focused on disease management and community resource support.   Patient is being recommended for a skilled nursing facility level of care.  Chart review reveals patient has had both doses of COVID-19 vaccines.  Plan: Follow patient for disposition and progression. Follow up with Inpatient Transition Of Care [TOC] team member to make aware that [place] [facility] following, if needed. If patient goes to SNF will update [facility] G-NP of disposition.   Of note, [facility] Care Management services does not replace or interfere with any services that are needed or arranged by inpatient [facility] care management team.  For additional questions or referrals please contact:  Natividad Brood, RN BSN [facility] Liaison  [phone] business mobile phone Toll free office [phone]  Fax number: [phone] [email or web address]

## 2019-08-16 LAB — CBC WITH DIFFERENTIAL/PLATELET
Abs Immature Granulocytes: 0.04 10*3/uL (ref 0.00–0.07)
Basophils Absolute: 0 10*3/uL (ref 0.0–0.1)
Basophils Relative: 0 %
Eosinophils Absolute: 0.3 10*3/uL (ref 0.0–0.5)
Eosinophils Relative: 4 %
HCT: 33.3 % — ABNORMAL LOW (ref 36.0–46.0)
Hemoglobin: 10.7 g/dL — ABNORMAL LOW (ref 12.0–15.0)
Immature Granulocytes: 0 %
Lymphocytes Relative: 13 %
Lymphs Abs: 1.2 10*3/uL (ref 0.7–4.0)
MCH: 27.8 pg (ref 26.0–34.0)
MCHC: 32.1 g/dL (ref 30.0–36.0)
MCV: 86.5 fL (ref 80.0–100.0)
Monocytes Absolute: 0.4 10*3/uL (ref 0.1–1.0)
Monocytes Relative: 4 %
Neutro Abs: 7.3 10*3/uL (ref 1.7–7.7)
Neutrophils Relative %: 79 %
Platelets: 262 10*3/uL (ref 150–400)
RBC: 3.85 MIL/uL — ABNORMAL LOW (ref 3.87–5.11)
RDW: 16.8 % — ABNORMAL HIGH (ref 11.5–15.5)
WBC: 9.3 10*3/uL (ref 4.0–10.5)
nRBC: 0 % (ref 0.0–0.2)

## 2019-08-16 LAB — BASIC METABOLIC PANEL
Anion gap: 11 (ref 5–15)
BUN: 53 mg/dL — ABNORMAL HIGH (ref 8–23)
CO2: 22 mmol/L (ref 22–32)
Calcium: 7.7 mg/dL — ABNORMAL LOW (ref 8.9–10.3)
Chloride: 92 mmol/L — ABNORMAL LOW (ref 98–111)
Creatinine, Ser: 2.26 mg/dL — ABNORMAL HIGH (ref 0.44–1.00)
GFR calc Af Amer: 23 mL/min — ABNORMAL LOW (ref >60)
GFR calc non Af Amer: 20 mL/min — ABNORMAL LOW (ref >60)
Glucose, Bld: 84 mg/dL (ref 70–99)
Potassium: 3.9 mmol/L (ref 3.5–5.1)
Sodium: 125 mmol/L — ABNORMAL LOW (ref 135–145)

## 2019-08-16 LAB — TSH: TSH: 9.253 u[IU]/mL — ABNORMAL HIGH (ref 0.350–4.500)

## 2019-08-16 LAB — OSMOLALITY, URINE: Osmolality, Ur: 180 mOsm/kg — ABNORMAL LOW (ref 300–900)

## 2019-08-16 LAB — OSMOLALITY: Osmolality: 278 mOsm/kg (ref 275–295)

## 2019-08-16 LAB — DIGOXIN LEVEL: Digoxin Level: 1.4 ng/mL (ref 0.8–2.0)

## 2019-08-16 LAB — SODIUM, URINE, RANDOM: Sodium, Ur: 11 mmol/L

## 2019-08-16 LAB — URIC ACID: Uric Acid, Serum: 6.2 mg/dL (ref 2.5–7.1)

## 2019-08-16 MED ORDER — ALPRAZOLAM 0.25 MG PO TABS: 0.2500 mg | ORAL_TABLET | Freq: Three times a day (TID) | ORAL | Status: DC | PRN

## 2019-08-16 MED ORDER — LEVOTHYROXINE SODIUM 25 MCG PO TABS
125.0000 ug | ORAL_TABLET | Freq: Every day | ORAL | Status: DC
  Administered 2019-08-17 – 2019-08-18 (×2): 125 ug via ORAL
  Filled 2019-08-16 (×2): qty 1

## 2019-08-16 MED ORDER — ALBUTEROL SULFATE (2.5 MG/3ML) 0.083% IN NEBU: 3.0000 mL | INHALATION_SOLUTION | RESPIRATORY_TRACT | Status: DC | PRN

## 2019-08-16 NOTE — Progress Notes (Signed)
PROGRESS NOTE    Katie [name]  UXL:[id number] DOB: [birth year] DOA: 08/12/2019 PCP: [name], MD   Brief Narrative: Patient is a 79 year old female with history of migraines, chronic A. fib, chronic systolic congestive heart failure, nonischemic cardiomyopathy with improved ejection fraction, status post ICD, COPD, chronic pain syndrome, peripheral neuropathy, peripheral vascular disease who presented to the emergency department with complaints of falls, increased confusion.  She had 3 unwitnessed falls in the last 1 to 2 weeks, no loss of consciousness.  On presentation she had AKI with creatinine of 2.7,leukocytosis.  Also found to have elevated digoxin level.  PT evaluated her and recommended skilled nursing  facility placement.  This morning her creatinine again went up and her sodium went down to 125. Nephrology consulted.  Assessment & Plan:   Active Problems:   Acute encephalopathy   Falls/confusion: Multiple etiology suspected.  Could be from AKI, polypharmacy.  She was on gabapentin, baclofen, Benadryl, tizanidine.  Pacemaker interrogation was done with no abnormal findings.  2D echocardiogram showed normal ejection fraction.  Exam is nonfocal.  CT head did not show any acute findings.  Mental status has improved considerably.  Continues to complain of pain from her fall.  She has severe neuropathy so gabapentin has been  restarted at low-dose.  PT/OT evaluation completed and  recommended skilled nursing facility placement.  Social worker consulted and following.  AKI on CKD stage IIIb: Baseline creatinine around 1.3-1.5.  Creatinine was 2.8 on presentation, suspected to be from overdiuresis.  She was hydrated with 3 L of fluid in the emergency department.  IV fluids have been stopped.  She was on torsemide and metolazone at home.  Renal ultrasound did not show any hydronephrosis. We restarted torsemide, creatinine again went up with sodium of 125. Nephrology consulted.  History  of chronic systolic/diastolic CHF with biventricular ICD: Recent echocardiogram showed improvement in ejection fraction of 60%.  Discussed with cardiology, pacemaker interrogation done ,result was found to be normal.  Restart torsemide.  Metolazone can be stopped.  Digoxin was elevated on presentation.  Will hold given improvement in the ejection fraction.  Hyponatremia: Asymptomatic.Sodium dropped to 126 today, could be associated  with hypervolemic hyponatremia from congestive heart failure.  Will resume torsemide  COPD/chronic respiratory failure: On home oxygen at night.  Currently stable  Leukocytosis:Resolved.  Most likely reactive.  Continue to monitor  Mild hyperbilirubinemia: Most likely secondary to fatty liver disease.  Right upper quadrant ultrasound is unremarkable.  Chronic A. fib: Rate is controlled.  Continue Xarelto  History of rheumatoid arthritis/gout: On leflunomide at home.  Will resume on discharge.          DVT prophylaxis: Xarelto Code Status: Full Family Communication: Granddaughter  present at the bedside Status is: Inpatient  Remains inpatient appropriate because:Unsafe d/c plan   Dispo: The patient is from: Home              Anticipated d/c is to: SNF              Anticipated d/c date is: 1 day              Patient currently is medically stable to d/c.    Waiting for bed at a skilled nursing facility  Consultants: None  Procedures: None  Antimicrobials:  Anti-infectives (From admission, onward)   None      Subjective: Patient seen and examined at the bedside this morning.  Hemodynamically stable.  She is completely alert and oriented this morning.  Very eager to go home.  Discussed with patient and explained that we could not discharge because her kidney function worsened and she has hyponatremia of 125.  Objective: Vitals:   08/15/19 1940 08/15/19 2331 08/16/19 0400 08/16/19 0811  BP: 121/62 (!) 147/92 133/77 127/67  Pulse: 70 70 69  70  Resp: 18 17  16   Temp: 98 F (36.7 C) 98.3 F (36.8 C) 98.4 F (36.9 C) (!) 97.3 F (36.3 C)  TempSrc:    Oral  SpO2: 97% 95% 99% 100%    Intake/Output Summary (Last 24 hours) at 08/16/2019 0826 Last data filed at 08/16/2019 0814 Gross per 24 hour  Intake 460 ml  Output 550 ml  Net -90 ml   There were no vitals filed for this visit.  Examination:  General exam: Appears calm and comfortable ,Not in distress,average built HEENT:PERRL,Oral mucosa moist, Ear/Nose normal on gross exam Respiratory system: Bilateral basal crackles Cardiovascular system: Afib, No JVD, murmurs, rubs, gallops or clicks. Gastrointestinal system: Abdomen is nondistended, soft and nontender. No organomegaly or masses felt. Normal bowel sounds heard. Central nervous system: Alert and oriented. No focal neurological deficits. Extremities: No edema, no clubbing ,no cyanosis Skin: No rashes, lesions or ulcers,no icterus ,no pallor   Data Reviewed: I have personally reviewed following labs and imaging studies  CBC: Recent Labs  Lab 08/12/19 1349 08/13/19 0654 08/14/19 0719 08/15/19 0657 08/16/19 0537  WBC 20.3* 19.4* 16.8* 13.3* 9.3  NEUTROABS  --   --   --   --  7.3  HGB 11.6* 11.0* 11.0* 11.0* 10.7*  HCT 37.2 33.8* 33.7* 33.5* 33.3*  MCV 92.1 88.5 86.6 86.8 86.5  PLT 258 218 226 234 716   Basic Metabolic Panel: Recent Labs  Lab 08/12/19 1349 08/13/19 0654 08/14/19 0719 08/15/19 0657 08/16/19 0537  NA 132* 131* 133* 126* 125*  K 4.5 4.1 4.0 4.0 3.9  CL 92* 95* 97* 93* 92*  CO2 21* 21* 23 19* 22  GLUCOSE 75 86 91 90 84  BUN 46* 45* 42* 43* 53*  CREATININE 2.78* 2.49* 2.23* 2.02* 2.26*  CALCIUM 9.1 7.2* 7.5* 7.7* 7.7*   GFR: Estimated Creatinine Clearance: 21.4 mL/min (A) (by C-G formula based on SCr of 2.26 mg/dL (H)). Liver Function Tests: Recent Labs  Lab 08/12/19 1349 08/13/19 0654  AST 27 24  ALT 19 17  ALKPHOS 59 58  BILITOT 2.5* 2.7*  PROT 6.8 5.1*  ALBUMIN 3.2* 2.3*    No results for input(s): LIPASE, AMYLASE in the last 168 hours. No results for input(s): AMMONIA in the last 168 hours. Coagulation Profile: No results for input(s): INR, PROTIME in the last 168 hours. Cardiac Enzymes: Recent Labs  Lab 08/13/19 0654  CKTOTAL 50   BNP (last 3 results) No results for input(s): PROBNP in the last 8760 hours. HbA1C: No results for input(s): HGBA1C in the last 72 hours. CBG: No results for input(s): GLUCAP in the last 168 hours. Lipid Profile: No results for input(s): CHOL, HDL, LDLCALC, TRIG, CHOLHDL, LDLDIRECT in the last 72 hours. Thyroid Function Tests: No results for input(s): TSH, T4TOTAL, FREET4, T3FREE, THYROIDAB in the last 72 hours. Anemia Panel: No results for input(s): VITAMINB12, FOLATE, FERRITIN, TIBC, IRON, RETICCTPCT in the last 72 hours. Sepsis Labs: Recent Labs  Lab 08/12/19 1349  PROCALCITON 1.28    Recent Results (from the past 240 hour(s))  SARS Coronavirus 2 by RT PCR (hospital order, performed in [facility] hospital lab) Nasopharyngeal Nasopharyngeal Swab  Status: None   Collection Time: 08/12/19  4:44 PM   Specimen: Nasopharyngeal Swab  Result Value Ref Range Status   SARS Coronavirus 2 NEGATIVE NEGATIVE Final    Comment: (NOTE) SARS-CoV-2 target nucleic acids are NOT DETECTED. The SARS-CoV-2 RNA is generally detectable in upper and lower respiratory specimens during the acute phase of infection. The lowest concentration of SARS-CoV-2 viral copies this assay can detect is 250 copies / mL. A negative result does not preclude SARS-CoV-2 infection and should not be used as the sole basis for treatment or other patient management decisions.  A negative result may occur with improper specimen collection / handling, submission of specimen other than nasopharyngeal swab, presence of viral mutation(s) within the areas targeted by this assay, and inadequate number of viral copies (<250 copies / mL). A negative result  must be combined with clinical observations, patient history, and epidemiological information. Fact Sheet for Patients:   StrictlyIdeas.no Fact Sheet for Healthcare Providers: BankingDealers.co.za This test is not yet approved or cleared  by the [place] FDA and has been authorized for detection and/or diagnosis of SARS-CoV-2 by FDA under an Emergency Use Authorization (EUA).  This EUA will remain in effect (meaning this test can be used) for the duration of the COVID-19 declaration under Section 564(b)(1) of the Act, 21 U.S.C. section 360bbb-3(b)(1), unless the authorization is terminated or revoked sooner. Performed at [facility], [place] [address] [place], [place] [address]   Culture, blood (routine x 2)     Status: None (Preliminary result)   Collection Time: 08/12/19  5:55 PM   Specimen: BLOOD  Result Value Ref Range Status   Specimen Description BLOOD BLOOD LEFT HAND  Final   Special Requests   Final    BOTTLES DRAWN AEROBIC AND ANAEROBIC Blood Culture results may not be optimal due to an inadequate volume of blood received in culture bottles   Culture   Final    NO GROWTH 3 DAYS Performed at [facility], [place] [address] [place], [place] [address]    Report Status PENDING  Incomplete  Culture, blood (routine x 2)     Status: None (Preliminary result)   Collection Time: 08/13/19  6:54 AM   Specimen: BLOOD  Result Value Ref Range Status   Specimen Description BLOOD SITE NOT SPECIFIED  Final   Special Requests   Final    BOTTLES DRAWN AEROBIC AND ANAEROBIC Blood Culture adequate volume   Culture   Final    NO GROWTH 2 DAYS Performed at [facility], [address], [place], [place] [address]    Report Status PENDING  Incomplete         Radiology Studies: No results found.      Scheduled Meds: . albuterol  3 mL Inhalation See admin instructions  . aspirin EC  81 mg Oral Daily  .  levothyroxine  100 mcg Oral QAC breakfast  . montelukast  10 mg Oral Daily  . potassium chloride SA  20 mEq Oral Daily  . pravastatin  20 mg Oral QHS  . Rivaroxaban  15 mg Oral Q supper   Continuous Infusions:   LOS: 4 days    Time spent: 25 mins.More than 50% of that time was spent in counseling and/or coordination of care.      Shelly Coss, MD [facility] P6/05/2019, 8:26 AM

## 2019-08-16 NOTE — Consult Note (Signed)
Katie [name] Admit Date: 08/12/2019 08/16/2019 Rexene Agent Requesting Physician:  Tawanna Solo  Reason for Consult:  AoCKD3 and Hyponatremia HPI:  79F admitted 5/30 after presenting with confusion, loss of consciousness, and 2 unwitnessed falls.  PMH Incudes:  Chronic atrial fibrillation on rivaroxaban  History of CHF, recent LVEF 65%, on digoxin, uses daily torsemide and as needed metolazone  PVD with history of abdominal aortic aneurysm status post endograft repair in 2015  COPD, former tobacco user  AICD present  RA on leflunomide  Gout  History of cholecystectomy  Patient's presentation is thought to be multifactorial.  Medications included baclofen, gabapentin, Benadryl, tizanidine, much of which has been held.  Admit head CT was negative.  UA at admit negative for leukocytes, nitrites, hemoglobin, protein.  AICD/pacemaker functioning properly.  Seen by PT and OT and plan for SNF at discharge.  Baseline creatinine is around 1.3-1.4.  She follows in our office with Dr. Marval Regal.  She was last seen on 07/11/2019.  Creatinine was stable at 1.3.  At presentation the patient's creatinine was 2.78, trend is as below, with a value of 2.02 and then 2.26 here today.  She received aggressive hydration at presentation, with concern for hypovolemia as she had used her torsemide and metolazone the day prior to admission.  No NSAID exposure.  No IV contrast.  CK was normal on 5/31.  Furthermore, she has developed progressive hyponatremia.  Serum sodium was normal at the time of her office visit in April.  Presenting value was 132 and today is 125.  As mentioned she has been hydrated and has also been pushing her fluid intakes by mouth, by her report.  Serum osmolarity today was 278.  She did receive torsemide this morning and has made more than a liter of urine.  She had significant peripheral edema, especially in the legs, over the weekend prompting taking her as needed metolazone.  This edema  has largely resolved at this time.  She denies dyspnea at the current time.  She is using a pure wick device.    Renal ultrasound on 5/30 demonstrated an 8.8 cm right and 9.1 cm left kidney with increased echogenicity bilaterally but no evidence of hydronephrosis or other acute structural issues.  The patient's granddaughter is present in the room and contributes to this conversation, her twin is joining [place] by video conference.  The patient's daughter, and the granddaughter's mother, is in [place] in the hospital.  This granddaughter is a Loss adjuster, chartered and works on a renal floor.  Creat (mg/dL)  Date Value  05/30/2019 1.31 (H)  02/27/2019 1.58 (H)  11/23/2018 1.42 (H)  07/04/2018 1.29 (H)  06/14/2018 1.37 (H)  05/31/2018 1.60 (H)  05/16/2018 2.67 (H)  05/11/2018 1.65 (H)  05/04/2018 1.46 (H)  02/02/2018 1.42 (H)   Creatinine, Ser (mg/dL)  Date Value  08/16/2019 2.26 (H)  08/15/2019 2.02 (H)  08/14/2019 2.23 (H)  08/13/2019 2.49 (H)  08/12/2019 2.78 (H)  09/05/2018 1.56 (H)  05/24/2018 1.92 (H)  05/23/2018 2.20 (H)  ] I/Os: I/O last 3 completed shifts: In: 74 [P.O.:460] Out: 300 [Urine:300]   ROS NSAIDS: Denies use IV Contrast no exposure TMP/SMX no exposure Hypotension not present Balance of 12 systems is negative w/ exceptions as above  PMH  Past Medical History:  Diagnosis Date  . AAA (abdominal aortic aneurysm) ([place])   . AICD (automatic cardioverter/defibrillator) present 11/10/2017  . Arthritis    "some in my knees" (11/10/2017)  . Atrial fibrillation ([place])   .  CHF (congestive heart failure) ([place])   . Chronic bronchitis ([place])   . COPD (chronic obstructive pulmonary disease) ([place])   . Depression   . GERD (gastroesophageal reflux disease)   . Gout    "daily RX" (11/10/2017)  . History of hiatal hernia   . Hyperlipidemia   . Hypertension   . Hypothyroid   . Migraine headache    "hx; none since 1980s" (11/10/2017)  . Osteopenia   . Pneumonia    "~ 3  times" (11/10/2017)  . PVD (peripheral vascular disease) ([place])    [place]  Past Surgical History:  Procedure Laterality Date  . ABDOMINAL AORTIC ENDOVASCULAR STENT GRAFT N/A 04/11/2013   Procedure: ABDOMINAL AORTIC ENDOVASCULAR STENT GRAFT WITH RIGHT FEMORAL PATCH ANGIOPLASTY;  Surgeon: Mal Misty, MD;  Location: [facility];  Service: Vascular;  Laterality: N/A;  . AV NODE ABLATION N/A 09/13/2018   Procedure: AV NODE ABLATION;  Surgeon: Evans Lance, MD;  Location: [facility];  Service: Cardiovascular;  Laterality: N/A;  . BIV ICD INSERTION CRT-D  11/10/2017  . BIV ICD INSERTION CRT-D N/A 11/10/2017   Procedure: BIV ICD INSERTION CRT-D;  Surgeon: Evans Lance, MD;  Location: [facility];  Service: Cardiovascular;  Laterality: N/A;  . BREAST SURGERY     LEFT BREAST BIOPSY  . CARDIAC CATHETERIZATION    . CATARACT EXTRACTION W/ INTRAOCULAR LENS  IMPLANT, BILATERAL Bilateral   . CHOLECYSTECTOMY OPEN  1972  . COLONOSCOPY    . EYE SURGERY Bilateral    "bleeding in my eyes"  . FRACTURE SURGERY    . TIBIA FRACTURE SURGERY Left 1970s  . TONSILLECTOMY    . VARICOSE VEIN SURGERY Bilateral    "laser"   FH  Family History  Problem Relation Age of Onset  . Cirrhosis Mother   . Cancer Mother 63       PANCREAS  . Heart defect Sister   . Breast cancer Sister        age 43  . Heart disease Sister   . Stroke Sister   . Alcohol abuse Father   . Depression Father   . Hypertension Brother   . Hyperlipidemia Son   . Heart disease Daughter    SH  reports that she quit smoking about 17 years ago. Her smoking use included cigarettes. She has a 108.00 pack-year smoking history. She has never used smokeless tobacco. She reports that she does not drink alcohol or use drugs. Allergies  Allergies  Allergen Reactions  . Amiodarone Other (See Comments)    PULMONARY TOXICITY  . Diovan [Valsartan] Other (See Comments)    HYPOTENSION  . Doxycycline Diarrhea and Other (See Comments)     VISUAL DISTURBANCE  . Flexeril [Cyclobenzaprine] Other (See Comments)    FATIGUE  . Keflex [Cephalexin] Diarrhea  . Verapamil Other (See Comments)    EDEMA  . Codeine Hives   Home medications Prior to Admission medications   Medication Sig Start Date End Date Taking? Authorizing Provider  acetaminophen-codeine (TYLENOL #3) 300-30 MG tablet Take 1-2 tablets by mouth every 8 (eight) hours as needed for moderate pain.    Yes [provider]  albuterol (PROAIR HFA) 108 (90 Base) MCG/ACT inhaler Use 2 Inhalations 15 minutes Apart every 4 hours to Rescue Asthma Attack Patient taking differently: Inhale 2 puffs into the lungs See admin instructions. Use 2 inhalations 15 minutes apart every 4 hours as needed for asthma attack 07/29/18  Yes [name], MD  allopurinol (  ZYLOPRIM) 300 MG tablet TAKE 1 TABLET BY MOUTH EVERY DAY Patient taking differently: Take 300 mg by mouth daily.  04/16/19  Yes Vicie Mutters, PA-C  amitriptyline (ELAVIL) 25 MG tablet Take 1 tablet 4 x /day for Neuropathy Pains Patient taking differently: Take 25 mg by mouth 4 (four) times daily. for Neuropathy Pains 03/21/19  Yes [name], MD  aspirin EC 81 MG tablet Take 81 mg by mouth daily.   Yes [provider]  baclofen (LIORESAL) 10 MG tablet Take 5-10 mg by mouth 3 (three) times daily as needed for muscle spasms.   Yes [provider]  Budeson-Glycopyrrol-Formoterol (BREZTRI AEROSPHERE) 160-9-4.8 MCG/ACT AERO Inhale 1 puff into the lungs 2 (two) times daily.   Yes [provider]  budesonide-formoterol (SYMBICORT) 160-4.5 MCG/ACT inhaler Inhale 2 puffs into the lungs 2 (two) times daily.   Yes [provider]  cetirizine (ZYRTEC) 10 MG tablet Take 10 mg by mouth daily as needed for allergies.    Yes [provider]  digoxin (LANOXIN) 0.125 MG tablet Take 1 tablet (0.125 mg total) by mouth daily. 01/05/18  Yes McClanahan, Danton Sewer, NP  diphenhydrAMINE (BENADRYL) 25 MG  tablet Take 25 mg by mouth every 6 (six) hours as needed for itching or allergies.   Yes [provider]  Eyelid Cleansers (AVENOVA) 0.01 % SOLN Apply 1 application topically See admin instructions. Wash eyelids twice daily with cleaner 11/28/17  Yes [provider]  gabapentin (NEURONTIN) 600 MG tablet TAKE 1 TABLET BY MOUTH THREE TIMES A DAY FOR PAIN Patient taking differently: Take 600 mg by mouth 3 (three) times daily. For pain 04/30/19  Yes Corbett, Caryl Pina, NP  Guaifenesin (MUCINEX MAXIMUM STRENGTH) 1200 MG TB12 Take 1,200 mg by mouth 2 (two) times daily as needed (cough/congestion).   Yes [provider]  ipratropium (ATROVENT) 0.06 % nasal spray Use 1 to 2 sprays each Nostril 2 to 3 x / day as needed Patient taking differently: Place 1-2 sprays into both nostrils 3 (three) times daily as needed for rhinitis (congestion).  02/27/19 02/28/20 Yes [name], MD  ipratropium-albuterol (DUONEB) 0.5-2.5 (3) MG/3ML SOLN Inhale 3 mLs into the lungs every 6 (six) hours as needed (for shortness of breath or wheezing). 02/27/19  Yes [name], MD  L-Lysine 500 MG TABS Take 500 mg by mouth daily.   Yes [provider]  leflunomide (ARAVA) 20 MG tablet Take 10 mg by mouth daily.  06/18/19  Yes [provider]  levothyroxine (SYNTHROID) 100 MCG tablet Take 1 tablet daily on an empty stomach with only water for 30 minutes & no Antacid meds, Calcium or Magnesium for 4 hours & avoid Biotin Patient taking differently: Take 100 mcg by mouth daily before breakfast. with only water for 30 minutes & no Antacid meds, Calcium or Magnesium for 4 hours & avoid Biotin 03/04/19  Yes [name], MD  Melatonin 10 MG TABS Take 10 mg by mouth daily as needed (sleep).   Yes [provider]  metolazone (ZAROXOLYN) 2.5 MG tablet Take 2.5 mg by mouth See admin instructions. Take one tablet (2.5 mg) by mouth 30 minutes prior to fluid pill when directed by MD   Yes  [provider]  montelukast (SINGULAIR) 10 MG tablet TAKE 1 TABLET DAILY FOR ALLERGIES & ASTHMA Patient taking differently: Take 10 mg by mouth daily. for Allergies & Asthma 02/27/19  Yes [name], MD  Olopatadine HCl 0.2 % SOLN Place 1 drop into both eyes daily  as needed (dry eyes).  12/18/18  Yes [provider]  OXYGEN Inhale 2 L into the lungs continuous.   Yes [provider]  Polyethyl Glycol-Propyl Glycol (SYSTANE) 0.4-0.3 % SOLN Place 1 drop into both eyes 4 (four) times daily as needed (for dry eyes).    Yes [provider]  potassium chloride SA (KLOR-CON) 20 MEQ tablet Take 20 mEq by mouth daily.    Yes [provider]  pravastatin (PRAVACHOL) 40 MG tablet Take 20 mg by mouth at bedtime.   Yes [provider]  Rivaroxaban (XARELTO) 15 MG TABS tablet Take 1 tablet Daily for Afib &  to Prevent Blood Clots Patient taking differently: Take 15 mg by mouth daily. for Afib &  to Prevent Blood Clots 06/24/19  Yes [name], MD  tiZANidine (ZANAFLEX) 4 MG tablet Take 4 mg by mouth every 6 (six) hours as needed for muscle spasms.   Yes [provider]  torsemide (DEMADEX) 20 MG tablet Take 2 tablets (40 mg total) by mouth 2 (two) times daily. 08/28/18  Yes Evans Lance, MD  traMADol (ULTRAM) 50 MG tablet Take 50 mg by mouth every 6 (six) hours as needed for moderate pain.   Yes [provider]  Zinc 50 MG TABS Take 50 mg by mouth daily.   Yes [provider]    Current Medications Scheduled Meds: . albuterol  3 mL Inhalation See admin instructions  . aspirin EC  81 mg Oral Daily  . [START ON 08/17/2019] levothyroxine  125 mcg Oral QAC breakfast  . montelukast  10 mg Oral Daily  . potassium chloride SA  20 mEq Oral Daily  . pravastatin  20 mg Oral QHS  . Rivaroxaban  15 mg Oral Q supper   Continuous Infusions: PRN Meds:.acetaminophen **OR** acetaminophen, diclofenac Sodium, melatonin, ondansetron  (ZOFRAN) IV, traMADol  CBC Recent Labs  Lab 08/14/19 0719 08/15/19 0657 08/16/19 0537  WBC 16.8* 13.3* 9.3  NEUTROABS  --   --  7.3  HGB 11.0* 11.0* 10.7*  HCT 33.7* 33.5* 33.3*  MCV 86.6 86.8 86.5  PLT 226 234 244   Basic Metabolic Panel Recent Labs  Lab 08/12/19 1349 08/13/19 0654 08/14/19 0719 08/15/19 0657 08/16/19 0537  NA 132* 131* 133* 126* 125*  K 4.5 4.1 4.0 4.0 3.9  CL 92* 95* 97* 93* 92*  CO2 21* 21* 23 19* 22  GLUCOSE 75 86 91 90 84  BUN 46* 45* 42* 43* 53*  CREATININE 2.78* 2.49* 2.23* 2.02* 2.26*  CALCIUM 9.1 7.2* 7.5* 7.7* 7.7*    Physical Exam  Blood pressure 135/63, pulse 70, temperature 98.2 F (36.8 C), temperature source Oral, resp. rate 14, SpO2 96 %. GEN: Elderly, chronically ill-appearing, NAD ENT: NCAT EYES: EOMI CV: Regular, normal S1 and S2 PULM: Bibasilar crackles, normal work of breathing ABD: Soft, nontender, bowel sounds present SKIN: No rashes/lesions/petechia/purpura EXT: No significant lower extremity edema Neuro: Mild confusion, nonfocal.  Assessment 25F AoCKD3 and mild hyponatremia after presenting with confusion, falls.  Work-up has been negative for specific etiology but polypharmacy is thought to be contributing.  She also had taken her metolazone the day prior to admission for significant edema.  I suspect the patient had some hemodynamic changes and has mild AKI, likely some ATN.  Regarding her hyponatremia, serum osmolality is fairly intact, I do not think that she is symptomatic from this.  It is probably related to free water intake in excess of excretion because of her  low GFR; furthermore, there might be a component of SIADH present.  Urine studies were not yet resulted.  1. AoCKD3; BL 1.3-1.4 SCr; likely related to hemodynamic insults, potential mild ATN.  Doubt GN.  No evidence of obstruction.  No indication for RRT.  Had been improving with slight worsening here today.  I anticipate with time it will further  improve. 2. Mild asymptomatic hyponatremia: Likely from impairment in free water excretion and potential mild SIADH.  Should improve with recovery of GFR and moderate fluid restriction.  Serum osmolality normal. 3. Acute encephalopathy/confusion, appears to be improving.  SNF recommended at discharge 4. Permanent atrial fibrillation, on Xarelto, EP investigated pacemaker, functioning properly 5. History of CHF, LVEF is reassuring.  Will need torsemide moving forward, doubt would need metolazone. 6. History of rheumatoid arthritis on leflunomide  Plan 1. As above, place 1.5 L fluid restriction. 2. Continue supportive care. 3. Hold diuretics for 24 hours. 4. Daily weights, Daily Renal Panel, Strict I/Os, Avoid nephrotoxins (NSAIDs, judicious IV Contrast) 5. We will follow along closely  Rexene Agent  08/16/2019, 1:56 PM

## 2019-08-16 NOTE — Progress Notes (Addendum)
Physical Therapy Treatment Patient Details Name: Katie [name] MRN: [id number] DOB: [birth year] Today's Date: 08/16/2019    History of Present Illness Patient is 79 y/o female who presents s/p falls and confusion suspected likely from AKI, polypharmacy and decreased renal clearance of numerous meds including gabapentin and amitriptyline. PMH includes migraines, chronic systolic CHF, ICD, COPD, chronic pain, peripheral neuropathy, PVD, A-fib, CKD, CHF, COPD, CVA, CAD, pulmonary fibrosis and aortic aneurysm.    PT Comments    Patient is making progress toward PT goals and tolerated increased gait distance compared to previous session. Pt presents with generalized weakness, decreased awareness of deficits, and SOB with mobility requiring standing breaks while ambulating. Pt with c/o dizziness with initial mobility that subsided (see general comments below for BP). SpO2 91-95% on RA during session. Pt c/o painful hands and edema/erythema noted bilaterally. Pt is declining d/c to SNF and given current mobility level and history of falls recommend pt have 24 hour assist/supervision if discharging home. Pt will continue to benefit from further skilled PT services to maximize independence and safety with mobility.     Follow Up Recommendations  SNF     Equipment Recommendations  None recommended by PT    Recommendations for Other Services       Precautions / Restrictions Precautions Precautions: Fall Precaution Comments: 3 falls in las 1-2 weeks Restrictions Weight Bearing Restrictions: No    Mobility  Bed Mobility               General bed mobility comments: pt OOB in chair upon arrival   Transfers Overall transfer level: Needs assistance Equipment used: Rolling walker (2 wheeled) Transfers: Sit to/from Stand Sit to Stand: Min guard         General transfer comment: cues for safe hand placement as pt pulls on RW to stand   Ambulation/Gait Ambulation/Gait assistance: Min  guard;Min assist Gait Distance (Feet): 120 Feet Assistive device: Rolling walker (2 wheeled) Gait Pattern/deviations: Decreased step length - right;Decreased step length - left;Trunk flexed;Wide base of support;Step-through pattern Gait velocity: decreased   General Gait Details: pt running into wall on L side at times in hallway; standing rest breaks due to fatigue and SOB; cues for increased bilat step lengths; assist to steady    Stairs             Wheelchair Mobility    Modified Rankin (Stroke Patients Only)       Balance Overall balance assessment: Needs assistance;History of Falls Sitting-balance support: Feet supported;No upper extremity supported Sitting balance-Leahy Scale: Fair     Standing balance support: Bilateral upper extremity supported;During functional activity Standing balance-Leahy Scale: Poor                              Cognition Arousal/Alertness: Awake/alert Behavior During Therapy: WFL for tasks assessed/performed Overall Cognitive Status: Impaired/Different from baseline Area of Impairment: Safety/judgement;Problem solving                       Following Commands: Follows one step commands consistently Safety/Judgement: Decreased awareness of safety;Decreased awareness of deficits   Problem Solving: Requires verbal cues;Difficulty sequencing        Exercises      General Comments General comments (skin integrity, edema, etc.): pt c/o dizziness after standing to clean up as pt was incontient of urine so returned to sitting and BP taken; BP in sitting 131/59 (81) ,SpO2 92% on RA  and in standing 152/63 (90), SpO2 95% on RA ; no dizziness reported again during session       Pertinent Vitals/Pain Pain Assessment: Faces Faces Pain Scale: Hurts little more Pain Location: grossly hands (erythema and edema noted bilaterally) Pain Descriptors / Indicators: Sore;Guarding Pain Intervention(s): Monitored during  session;Limited activity within patient's tolerance    Home Living                      Prior Function            PT Goals (current goals can now be found in the care plan section) Progress towards PT goals: Progressing toward goals    Frequency    Min 3X/week      PT Plan Current plan remains appropriate    Co-evaluation              AM-PAC PT "6 Clicks" Mobility   Outcome Measure  Help needed turning from your back to your side while in a flat bed without using bedrails?: A Little Help needed moving from lying on your back to sitting on the side of a flat bed without using bedrails?: A Little Help needed moving to and from a bed to a chair (including a wheelchair)?: A Little Help needed standing up from a chair using your arms (e.g., wheelchair or bedside chair)?: A Little Help needed to walk in hospital room?: A Little Help needed climbing 3-5 steps with a railing? : A Lot 6 Click Score: 17    End of Session Equipment Utilized During Treatment: Gait belt Activity Tolerance: Patient limited by fatigue;Other (comment)(SOB) Patient left: in chair;with call bell/phone within reach;with chair alarm set Nurse Communication: Mobility status PT Visit Diagnosis: Unsteadiness on feet (R26.81);Muscle weakness (generalized) (M62.81);Difficulty in walking, not elsewhere classified (R26.2);Pain Pain - part of body: (everywhere)     Time: 0867-6195 PT Time Calculation (min) (ACUTE ONLY): 33 min  Charges:  $Gait Training: 8-22 mins $Therapeutic Activity: 8-22 mins                     Katie [name], PTA Acute Rehabilitation Services Pager: [phone] Office: [phone]     Katie [name] 08/16/2019, 3:59 PM

## 2019-08-16 NOTE — TOC Progression Note (Signed)
Transition of Care [facility]) - Progression Note    Patient Details  Name: Katie [name] MRN: [id number] Date of Birth: 02/21/1941  Transition of Care [facility] CM/SW [place], [place] Phone Number: 08/16/2019, 4:42 PM  Clinical Narrative:   CSW updated [employer] Medicare with SNF choice of [facility], and CSW received authorization information. CSW met with patient to update, and she is thinking about going home instead. CSW encouraged patient to think about SNF as it would only be short term, to help her be less at risk for falls upon returning home. Patient indicated that she has 24 hour supervision at home with her husband, but family has expressed concerns about the husband's lack of assistance around the house. Patient to think about SNF and discuss with CSW tomorrow.     Expected Discharge Plan: [place] Barriers to Discharge: Continued Medical Work up  Expected Discharge Plan and Services Expected Discharge Plan: [place] In-house Referral: Clinical Social Work Discharge Planning Services: CM Consult   Living arrangements for the past 2 months: Single Family Home                                       Social Determinants of Health (SDOH) Interventions    Readmission Risk Interventions No flowsheet data found.

## 2019-08-17 LAB — BASIC METABOLIC PANEL
Anion gap: 12 (ref 5–15)
BUN: 45 mg/dL — ABNORMAL HIGH (ref 8–23)
CO2: 25 mmol/L (ref 22–32)
Calcium: 8.4 mg/dL — ABNORMAL LOW (ref 8.9–10.3)
Chloride: 96 mmol/L — ABNORMAL LOW (ref 98–111)
Creatinine, Ser: 1.72 mg/dL — ABNORMAL HIGH (ref 0.44–1.00)
GFR calc Af Amer: 33 mL/min — ABNORMAL LOW (ref >60)
GFR calc non Af Amer: 28 mL/min — ABNORMAL LOW (ref >60)
Glucose, Bld: 89 mg/dL (ref 70–99)
Potassium: 3.8 mmol/L (ref 3.5–5.1)
Sodium: 133 mmol/L — ABNORMAL LOW (ref 135–145)

## 2019-08-17 LAB — CULTURE, BLOOD (ROUTINE X 2): Culture: NO GROWTH

## 2019-08-17 LAB — SARS CORONAVIRUS 2 BY RT PCR (HOSPITAL ORDER, PERFORMED IN ~~LOC~~ HOSPITAL LAB): SARS Coronavirus 2: NEGATIVE

## 2019-08-17 MED ORDER — GABAPENTIN 100 MG PO CAPS
100.0000 mg | ORAL_CAPSULE | Freq: Three times a day (TID) | ORAL | 1 refills | Status: DC
Start: 2019-08-17 — End: 2019-08-30

## 2019-08-17 MED ORDER — TORSEMIDE 20 MG PO TABS
40.0000 mg | ORAL_TABLET | Freq: Two times a day (BID) | ORAL | Status: DC
  Administered 2019-08-18: 40 mg via ORAL
  Filled 2019-08-17: qty 2

## 2019-08-17 MED ORDER — LEVOTHYROXINE SODIUM 125 MCG PO TABS: 125.0000 ug | ORAL_TABLET | Freq: Every day | ORAL | 1 refills | Status: AC

## 2019-08-17 NOTE — Progress Notes (Signed)
Physical Therapy Treatment Patient Details Name: Katie [name] MRN: [id number] DOB: 26-Apr-1940 Today's Date: 08/17/2019    History of Present Illness Patient is 79 y/o female who presents s/p falls and confusion suspected likely from AKI, polypharmacy and decreased renal clearance of numerous meds including gabapentin and amitriptyline. PMH includes migraines, chronic systolic CHF, ICD, COPD, chronic pain, peripheral neuropathy, PVD, A-fib, CKD, CHF, COPD, CVA, CAD, pulmonary fibrosis and aortic aneurysm.    PT Comments    Patient seen for mobility progression.  Pt requires min guard-min A for gait and stair training. Pt is reliant on bilat UE support for ambulation due to generalized weakness and balance deficits. Given history of multiple falls, current mobility level, and decreased awareness of safety/deficits continue to recommend SNF for further skilled PT services.    Follow Up Recommendations  SNF     Equipment Recommendations  None recommended by PT    Recommendations for Other Services       Precautions / Restrictions Precautions Precautions: Fall Precaution Comments: 3 falls in las 1-2 weeks Restrictions Weight Bearing Restrictions: No    Mobility  Bed Mobility               General bed mobility comments: OOB in recliner upon entry  Transfers Overall transfer level: Needs assistance Equipment used: Rolling walker (2 wheeled) Transfers: Sit to/from Stand Sit to Stand: Min guard         General transfer comment: for safety; cues for safety when sitting down   Ambulation/Gait Ambulation/Gait assistance: Min guard Gait Distance (Feet): (100 ft then 80 ft with seated rest break ) Assistive device: Rolling walker (2 wheeled) Gait Pattern/deviations: Step-through pattern;Decreased step length - right;Decreased step length - left Gait velocity: decreased   General Gait Details: pt with decreased bilateral step lengths and height; seated rest break required  due to fatigue; SOB with mobility on 1L O2 via [place]   Stairs Stairs: Yes Stairs assistance: Min guard;Min assist Stair Management: One rail Left;Two rails;Alternating pattern;Forwards;Sideways Number of Stairs: (2 steps X 2 trials ) General stair comments: practiced sideways using L hand rail to simulate home entrance    Wheelchair Mobility    Modified Rankin (Stroke Patients Only)       Balance Overall balance assessment: Needs assistance;History of Falls Sitting-balance support: Feet supported;No upper extremity supported Sitting balance-Leahy Scale: Good     Standing balance support: Bilateral upper extremity supported;During functional activity Standing balance-Leahy Scale: Poor Standing balance comment: relies on RW dynamically, able to engage in ADls without UE support and min guard                            Cognition Arousal/Alertness: Awake/alert Behavior During Therapy: WFL for tasks assessed/performed Overall Cognitive Status: Impaired/Different from baseline Area of Impairment: Safety/judgement;Problem solving;Memory;Awareness                     Memory: Decreased recall of precautions;Decreased short-term memory Following Commands: Follows one step commands with increased time;Follows multi-step commands inconsistently Safety/Judgement: Decreased awareness of deficits;Decreased awareness of safety Awareness: Emergent Problem Solving: Slow processing;Difficulty sequencing;Requires verbal cues General Comments: patient oriented and follows commands with increased time, poor safety awareness, O2 line mgmt, and problem sovling; able to count backwards from 20 (with cueing to finish task, pt stopping at 4), 2 errors in months backwards and recalls 2/3 words after 5 min      Exercises  General Comments General comments (skin integrity, edema, etc.): requires assist for line mgmt, VSS during session; educated on recommendations for SNF or  24/7 supervision; reviewed recommendations for assist with med mgmt and cooking/cleaning at this time       Pertinent Vitals/Pain Pain Assessment: No/denies pain Faces Pain Scale: No hurt    Home Living                      Prior Function            PT Goals (current goals can now be found in the care plan section) Acute Rehab PT Goals Patient Stated Goal: home  Progress towards PT goals: Progressing toward goals    Frequency    Min 3X/week      PT Plan Current plan remains appropriate    Co-evaluation              AM-PAC PT "6 Clicks" Mobility   Outcome Measure  Help needed turning from your back to your side while in a flat bed without using bedrails?: None Help needed moving from lying on your back to sitting on the side of a flat bed without using bedrails?: A Little Help needed moving to and from a bed to a chair (including a wheelchair)?: A Little Help needed standing up from a chair using your arms (e.g., wheelchair or bedside chair)?: A Little Help needed to walk in hospital room?: A Little Help needed climbing 3-5 steps with a railing? : A Little 6 Click Score: 19    End of Session Equipment Utilized During Treatment: Gait belt Activity Tolerance: Patient tolerated treatment well Patient left: in chair;with call bell/phone within reach;with chair alarm set;with family/visitor present Nurse Communication: Mobility status PT Visit Diagnosis: Unsteadiness on feet (R26.81);Muscle weakness (generalized) (M62.81);Difficulty in walking, not elsewhere classified (R26.2);Pain     Time: 4492-0100 PT Time Calculation (min) (ACUTE ONLY): 30 min  Charges:  $Gait Training: 23-37 mins                     Earney Navy, PTA Acute Rehabilitation Services Pager: [phone] Office: [phone]     Darliss Cheney 08/17/2019, 4:51 PM

## 2019-08-17 NOTE — TOC Progression Note (Signed)
Transition of Care [facility]) - Progression Note    Patient Details  Name: Katie [name] MRN: [id number] Date of Birth: 01/09/1941  Transition of Care [facility]) CM/SW [place], [place] Phone Number: 08/17/2019, 4:44 PM  Clinical Narrative:   CSW met with patient earlier today about SNF refusal, said she wanted to go home. Patient said she thought she had used [place] before, but Nanine Means did not have her information on record. CSW reached out to daughter to discuss home health agency, and daughter on her way to the hospital to meet with patient to discuss SNF recommendation and try to convince her to go.  CSW met with patient and daughter at bedside to discuss SNF recommendation, and patient is now agreeable to SNF. CSW answered the patient's questions, and then confirmed with [facility] that bed was still available for the patient to admit tomorrow. Patient will need updated COVID, asked MD to order. [facility] will have bed available tomorrow.     Expected Discharge Plan: [place] Barriers to Discharge: Continued Medical Work up  Expected Discharge Plan and Services Expected Discharge Plan: [place] In-house Referral: Clinical Social Work Discharge Planning Services: CM Consult   Living arrangements for the past 2 months: Single Family Home Expected Discharge Date: 08/17/19                                     Social Determinants of Health (SDOH) Interventions    Readmission Risk Interventions No flowsheet data found.

## 2019-08-17 NOTE — Progress Notes (Addendum)
Occupational Therapy Treatment Patient Details Name: Katie [name] MRN: [id number] DOB: 02/18/1941 Today's Date: 08/17/2019    History of present illness Patient is 79 y/o female who presents s/p falls and confusion suspected likely from AKI, polypharmacy and decreased renal clearance of numerous meds including gabapentin and amitriptyline. PMH includes migraines, chronic systolic CHF, ICD, COPD, chronic pain, peripheral neuropathy, PVD, A-fib, CKD, CHF, COPD, CVA, CAD, pulmonary fibrosis and aortic aneurysm.   OT comments  Patient seated in recliner upon entry and agreeable to OT.  Completes in room mobility and transfers using RW with min guard assist for safety, assist required for O2 line mgmt and safety awareness.  Patient demonstrates ability to complete dressing with min assist for LB, bathing with min guard for pericare bathing, min guard for toilet transfers and toileting.  She requires min cueing throughout session for recall, problem solving, attention, and safety awareness. Educated on fall prevention and iADL safety, she remains a high fall risk (with increased risk due to O2 line mgmt requirements).  Pt currently declining SNF rehab, recommend 24/7 supervision, assist with med mgmt and cooking/cleaning at this time (pt reports grand daugther/spouse will drive).    Follow Up Recommendations  SNF;Supervision/Assistance - 24 hour;Home health OT(if refuses SNF, HHOT recommended)    Equipment Recommendations  3 in 1 bedside commode    Recommendations for Other Services      Precautions / Restrictions Precautions Precautions: Fall Precaution Comments: 3 falls in las 1-2 weeks Restrictions Weight Bearing Restrictions: No       Mobility Bed Mobility               General bed mobility comments: OOB in recliner upon entry  Transfers Overall transfer level: Needs assistance Equipment used: Rolling walker (2 wheeled) Transfers: Sit to/from Stand Sit to Stand: Min guard         General transfer comment: for safety    Balance Overall balance assessment: Needs assistance;History of Falls Sitting-balance support: Feet supported;No upper extremity supported Sitting balance-Leahy Scale: Fair     Standing balance support: Bilateral upper extremity supported;During functional activity Standing balance-Leahy Scale: Poor Standing balance comment: relies on RW dynamically, able to engage in ADls without UE support and min guard                           ADL either performed or assessed with clinical judgement   ADL Overall ADL's : Needs assistance/impaired     Grooming: Min guard;Standing Grooming Details (indicate cue type and reason): washing hands at sink      Lower Body Bathing: Min guard;Sit to/from stand Lower Body Bathing Details (indicate cue type and reason): standing at sink for peri care, min cueing for thoroughness     Lower Body Dressing: Sit to/from stand;Minimal assistance Lower Body Dressing Details (indicate cue type and reason): min guard for sit to stand, able to don new mest underwear and adjust socks but min assist to doff old mesh underwear  Toilet Transfer: Min guard;Ambulation;RW;Regular Glass blower/designer Details (indicate cue type and reason): cueing for line mgmt and safety          Functional mobility during ADLs: Min guard;Rolling walker;Cueing for safety General ADL Comments: pt limited by safety, cognition, balance     Vision   Vision Assessment?: No apparent visual deficits   Perception     Praxis      Cognition Arousal/Alertness: Awake/alert Behavior During Therapy: WFL for tasks assessed/performed Overall  Cognitive Status: Impaired/Different from baseline Area of Impairment: Safety/judgement;Problem solving;Memory;Awareness                     Memory: Decreased recall of precautions;Decreased short-term memory Following Commands: Follows one step commands with increased time;Follows  multi-step commands inconsistently Safety/Judgement: Decreased awareness of deficits;Decreased awareness of safety Awareness: Emergent Problem Solving: Slow processing;Difficulty sequencing;Requires verbal cues General Comments: patient oriented and follows commands with increased time, poor safety awareness, O2 line mgmt, and problem sovling; able to count backwards from 20 (with cueing to finish task, pt stopping at 4), 2 errors in months backwards and recalls 2/3 words after 5 min        Exercises     Shoulder Instructions       General Comments requires assist for line mgmt, VSS during session; educated on recommendations for SNF or 24/7 supervision; reviewed recommendations for assist with med mgmt and cooking/cleaning at this time     Pertinent Vitals/ Pain       Pain Assessment: No/denies pain Faces Pain Scale: No hurt  Home Living                                          Prior Functioning/Environment              Frequency  Min 2X/week        Progress Toward Goals  OT Goals(current goals can now be found in the care plan section)  Progress towards OT goals: Progressing toward goals  Acute Rehab OT Goals Patient Stated Goal: home  OT Goal Formulation: With patient  Plan Discharge plan remains appropriate;Frequency remains appropriate    Co-evaluation                 AM-PAC OT "6 Clicks" Daily Activity     Outcome Measure   Help from another person eating meals?: A Little Help from another person taking care of personal grooming?: A Little Help from another person toileting, which includes using toliet, bedpan, or urinal?: A Little Help from another person bathing (including washing, rinsing, drying)?: A Little Help from another person to put on and taking off regular upper body clothing?: A Little Help from another person to put on and taking off regular lower body clothing?: A Little 6 Click Score: 18    End of Session  Equipment Utilized During Treatment: Gait belt;Rolling walker  OT Visit Diagnosis: Other abnormalities of gait and mobility (R26.89);Muscle weakness (generalized) (M62.81);Pain;Other symptoms and signs involving cognitive function;History of falling (Z91.81)   Activity Tolerance Patient tolerated treatment well   Patient Left in chair;with call bell/phone within reach;with chair alarm set;with nursing/sitter in room   Nurse Communication Mobility status        Time: 5573-2202 OT Time Calculation (min): 27 min  Charges: OT General Charges $OT Visit: 1 Visit OT Treatments $Self Care/Home Management : 23-37 mins  [place] Pager [phone] Office [place] 08/17/2019, 1:20 PM

## 2019-08-17 NOTE — Discharge Summary (Addendum)
Physician Discharge Summary  Katie [name] XHB:[id number] DOB: 06-01-1940 DOA: 08/12/2019  PCP: [name], MD  Admit date: 08/12/2019 Discharge date: 08/18/19 Admitted From: Home Disposition:  SNF  Discharge Condition:Stable CODE STATUS:FULL Diet recommendation: Heart Healthy   Brief/Interim Summary: Patient is a 79 year old female with history of migraines, chronic A. fib, chronic systolic congestive heart failure, nonischemic cardiomyopathy with improved ejection fraction, status post ICD, COPD, chronic pain syndrome, peripheral neuropathy, peripheral vascular disease who presented to the emergency department with complaints of falls, increased confusion.  She had 3 unwitnessed falls in the last 1 to 2 weeks, no loss of consciousness.  On presentation she had AKI with creatinine of 2.7,leukocytosis.  Also found to have elevated digoxin level.  Hospital course also remarkable for hyponatremia.  Her kidney function and sodium level have improved today. PT evaluated her and recommended skilled nursing  facility placement.   She is hemodynamically stable for discharge today.  Following problems were addressed during hospitalization:  Falls/confusion: Multiple etiology suspected.  Could be from AKI, polypharmacy.  She was on gabapentin, baclofen, Benadryl, tizanidine.  Pacemaker interrogation was done with no abnormal findings.  2D echocardiogram showed normal ejection fraction.  Exam was nonfocal.  CT head did not show any acute findings.  Mental status has improved considerably.  She has severe neuropathy so gabapentin has been  restarted at low-dose.  PT/OT evaluation completed and  recommended skilled nursing facility placement but she opted home health.  Currently she is alert and oriented.  AKI on CKD stage IIIb: Baseline creatinine around 1.3-1.5.  Creatinine was 2.8 on presentation, suspected to be from overdiuresis.  She was hydrated with 3 L of fluid in the emergency department.  IV  fluids have been stopped.  She was on torsemide and metolazone at home.  Renal ultrasound did not show any hydronephrosis. We restarted torsemide on discharge.  History of chronic systolic/diastolic CHF with biventricular ICD: Recent echocardiogram showed improvement in ejection fraction of 60%.  Discussed with cardiology, pacemaker interrogation done ,result was found to be normal.  Restart torsemide.  Metolazone can be stopped.  Digoxin was elevated on presentation.  Will hold given improvement in the ejection fraction.  Hyponatremia: Asymptomatic.improved today  COPD/chronic respiratory failure: On home oxygen at night.  Currently stable  Leukocytosis:Resolved.  Most likely reactive.  Mild hyperbilirubinemia: Most likely secondary to fatty liver disease.  Right upper quadrant ultrasound is unremarkable.  Chronic A. fib: Rate is controlled.  Continue Xarelto  History of rheumatoid arthritis/gout: On leflunomide at home.  Will resume on discharge.    Discharge Diagnoses:  Active Problems:   Acute encephalopathy    Discharge Instructions  Discharge Instructions    Diet - low sodium heart healthy   Complete by: As directed    Discharge instructions   Complete by: As directed    1)Please take prescribed medications as instructed. 2)Follow up with your PCP in a week. Do a BMP test during the follow up. 3)You will be called by nephrology for the follow up appointment.   Increase activity slowly   Complete by: As directed      Allergies as of 08/18/2019      Reactions   Amiodarone Other (See Comments)   PULMONARY TOXICITY   Diovan [valsartan] Other (See Comments)   HYPOTENSION   Doxycycline Diarrhea, Other (See Comments)   VISUAL DISTURBANCE   Flexeril [cyclobenzaprine] Other (See Comments)   FATIGUE   Keflex [cephalexin] Diarrhea   Verapamil Other (See Comments)   EDEMA  Codeine Hives      Medication List    STOP taking these medications    acetaminophen-codeine 300-30 MG tablet Commonly known as: TYLENOL #3   amitriptyline 25 MG tablet Commonly known as: ELAVIL   baclofen 10 MG tablet Commonly known as: LIORESAL   Breztri Aerosphere 160-9-4.8 MCG/ACT Aero Generic drug: Budeson-Glycopyrrol-Formoterol   digoxin 0.125 MG tablet Commonly known as: LANOXIN   gabapentin 600 MG tablet Commonly known as: NEURONTIN Replaced by: gabapentin 100 MG capsule   metolazone 2.5 MG tablet Commonly known as: ZAROXOLYN   tiZANidine 4 MG tablet Commonly known as: ZANAFLEX     TAKE these medications   albuterol 108 (90 Base) MCG/ACT inhaler Commonly known as: ProAir HFA Use 2 Inhalations 15 minutes Apart every 4 hours to Rescue Asthma Attack What changed:   how much to take  how to take this  when to take this  additional instructions   allopurinol 300 MG tablet Commonly known as: ZYLOPRIM TAKE 1 TABLET BY MOUTH EVERY DAY   aspirin EC 81 MG tablet Take 81 mg by mouth daily.   Avenova 0.01 % Soln Apply 1 application topically See admin instructions. Wash eyelids twice daily with cleaner   budesonide-formoterol 160-4.5 MCG/ACT inhaler Commonly known as: SYMBICORT Inhale 2 puffs into the lungs 2 (two) times daily.   cetirizine 10 MG tablet Commonly known as: ZYRTEC Take 10 mg by mouth daily as needed for allergies.   diphenhydrAMINE 25 MG tablet Commonly known as: BENADRYL Take 25 mg by mouth every 6 (six) hours as needed for itching or allergies.   gabapentin 100 MG capsule Commonly known as: Neurontin Take 1 capsule (100 mg total) by mouth 3 (three) times daily. Replaces: gabapentin 600 MG tablet   ipratropium 0.06 % nasal spray Commonly known as: ATROVENT Use 1 to 2 sprays each Nostril 2 to 3 x / day as needed What changed:   how much to take  how to take this  when to take this  reasons to take this  additional instructions   ipratropium-albuterol 0.5-2.5 (3) MG/3ML Soln Commonly known  as: DUONEB Inhale 3 mLs into the lungs every 6 (six) hours as needed (for shortness of breath or wheezing).   L-Lysine 500 MG Tabs Take 500 mg by mouth daily.   leflunomide 20 MG tablet Commonly known as: ARAVA Take 10 mg by mouth daily.   levothyroxine 125 MCG tablet Commonly known as: SYNTHROID Take 1 tablet (125 mcg total) by mouth daily before breakfast. What changed:   medication strength  how much to take  how to take this  when to take this  additional instructions   Melatonin 10 MG Tabs Take 10 mg by mouth daily as needed (sleep).   montelukast 10 MG tablet Commonly known as: SINGULAIR TAKE 1 TABLET DAILY FOR ALLERGIES & ASTHMA What changed: See the new instructions.   Mucinex Maximum Strength 1200 MG Tb12 Generic drug: Guaifenesin Take 1,200 mg by mouth 2 (two) times daily as needed (cough/congestion).   Olopatadine HCl 0.2 % Soln Place 1 drop into both eyes daily as needed (dry eyes).   OXYGEN Inhale 2 L into the lungs continuous.   potassium chloride SA 20 MEQ tablet Commonly known as: KLOR-CON Take 20 mEq by mouth daily.   pravastatin 40 MG tablet Commonly known as: PRAVACHOL Take 20 mg by mouth at bedtime.   Rivaroxaban 15 MG Tabs tablet Commonly known as: Xarelto Take 1 tablet Daily for Afib &  to Prevent  Blood Clots What changed:   how much to take  how to take this  when to take this  additional instructions   Systane 0.4-0.3 % Soln Generic drug: Polyethyl Glycol-Propyl Glycol Place 1 drop into both eyes 4 (four) times daily as needed (for dry eyes).   torsemide 20 MG tablet Commonly known as: DEMADEX Take 2 tablets (40 mg total) by mouth 2 (two) times daily.   traMADol 50 MG tablet Commonly known as: ULTRAM Take 1 tablet (50 mg total) by mouth every 6 (six) hours as needed for moderate pain.   Zinc 50 MG Tabs Take 50 mg by mouth daily.            Durable Medical Equipment  (From admission, onward)         Start      Ordered   08/17/19 1402  For home use only DME 3 n 1  Once     08/17/19 1401          Contact information for follow-up providers    [name], MD. Schedule an appointment as soon as possible for a visit in 1 week(s).   Specialty: Internal Medicine Contact information: [address] [place] [address] [phone]        Care, [facility] Follow up.   Specialty: [place] Why: the office will call to schedule visits beginning Tuesday next week Contact information: [place] [address] [phone]            Contact information for after-discharge care    Destination    [facility] .   Service: Skilled Nursing Contact information: [address] [place] [phone]                 Allergies  Allergen Reactions  . Amiodarone Other (See Comments)    PULMONARY TOXICITY  . Diovan [Valsartan] Other (See Comments)    HYPOTENSION  . Doxycycline Diarrhea and Other (See Comments)    VISUAL DISTURBANCE  . Flexeril [Cyclobenzaprine] Other (See Comments)    FATIGUE  . Keflex [Cephalexin] Diarrhea  . Verapamil Other (See Comments)    EDEMA  . Codeine Hives    Consultations:  Nephrology   Procedures/Studies: DG Chest 2 View  Result Date: 08/08/2019 CLINICAL DATA:  Fall.  Right rib pain EXAM: CHEST - 2 VIEW COMPARISON:  05/17/2019 FINDINGS: AICD unchanged in position. Cardiac enlargement without heart failure. Pulmonary scarring right upper lobe unchanged. No acute infiltrate or effusion. Bb marks an area of pain right lower anterior ribs. No displaced rib fractures identified. Abdominal aortic stent graft noted. IMPRESSION: No acute abnormality. Electronically Signed   By: Franchot Gallo M.D.   On: 08/08/2019 08:47   CT Head Wo Contrast  Result Date: 08/12/2019 CLINICAL DATA:  Head trauma.  Unwitnessed fall. EXAM: CT HEAD WITHOUT  CONTRAST TECHNIQUE: Contiguous axial images were obtained from the base of the skull through the vertex without intravenous contrast. COMPARISON:  January 12, 2014 FINDINGS: Brain: No subdural, epidural, or subarachnoid hemorrhage. Ventricles and sulci are prominent but stable. Cerebellum, brainstem, and basal cisterns are within normal limits. Chronic white matter changes are noted. No acute cortical ischemia or infarct. No mass effect or midline shift. Vascular: Calcified atherosclerosis in the intracranial carotids. Skull: Normal. Negative for fracture or focal lesion. Sinuses/Orbits: No acute finding. Other: None. IMPRESSION: 1. No acute intracranial abnormalities identified. Electronically Signed   By: Dorise Bullion III  M.D   On: 08/12/2019 14:20   US RENAL  Result Date: 08/12/2019 CLINICAL DATA:  Renal injury. EXAM: RENAL / URINARY TRACT ULTRASOUND COMPLETE COMPARISON:  None. FINDINGS: Right Kidney: Renal measurements: 8.8 x 4.5 x 4.5 cm = volume: 82.3 mL. Increased cortical echogenicity. There may be slight perinephric fluid. No hydronephrosis. Left Kidney: Renal measurements: 9.1 x 5.3 x 5.0 cm = volume: 125 mL. Increased cortical echogenicity. Bladder: Appears normal for degree of bladder distention. Other: None. IMPRESSION: Medical renal disease.  No acute abnormalities.  No hydronephrosis. Electronically Signed   By: Dorise Bullion III M.D   On: 08/12/2019 18:56   DG Pelvis Portable  Result Date: 08/13/2019 CLINICAL DATA:  Fall 1 week ago with progressive pelvic pain, initial encounter EXAM: PORTABLE PELVIS 1-2 VIEWS COMPARISON:  None. FINDINGS: Pelvic ring is intact. No acute fracture or dislocation is noted. Degenerative changes of the hip joints and lumbar spine are seen. Bilateral iliac stents are noted. IMPRESSION: No acute abnormality noted. Electronically Signed   By: Inez Catalina M.D.   On: 08/13/2019 02:25   DG Chest Port 1 View  Result Date: 08/12/2019 CLINICAL DATA:  79 year old  female with weakness. EXAM: PORTABLE CHEST 1 VIEW COMPARISON:  Chest radiograph dated 08/07/2019. FINDINGS: There is diffuse chronic interstitial coarsening and bronchitic changes. Faint bilateral lower lung field interstitial densities, likely chronic. Developing infiltrate is not excluded. No focal consolidation, large pleural effusion, or pneumothorax. Stable cardiomegaly. Atherosclerotic calcification of the aorta. Left pectoral AICD device. No acute osseous pathology. IMPRESSION: 1. No focal consolidation. 2. Chronic interstitial coarsening and bronchitic changes. Electronically Signed   By: Anner Crete M.D.   On: 08/12/2019 16:05   DG Knee Left Port  Result Date: 08/13/2019 CLINICAL DATA:  Fall 1 week ago with left knee pain, initial encounter EXAM: PORTABLE LEFT KNEE - 4 VIEW COMPARISON:  None. FINDINGS: No evidence of fracture, dislocation, or joint effusion. Meniscal calcifications are noted. Mild medial joint space narrowing is seen. IMPRESSION: Mild degenerative change without acute abnormality. Electronically Signed   By: Inez Catalina M.D.   On: 08/13/2019 02:27   DG Knee Right Port  Result Date: 08/13/2019 CLINICAL DATA:  Fall 1 week ago with right knee pain, initial encounter EXAM: PORTABLE RIGHT KNEE - 4 VIEW COMPARISON:  None. FINDINGS: No evidence of fracture, dislocation, or joint effusion. No evidence of arthropathy or other focal bone abnormality. Mild meniscal calcifications are seen. IMPRESSION: No acute abnormality noted. Electronically Signed   By: Inez Catalina M.D.   On: 08/13/2019 02:26   ECHOCARDIOGRAM COMPLETE  Result Date: 08/13/2019    ECHOCARDIOGRAM REPORT   Patient Name:   TEPHANIE ESCORCIA Date of Exam: 08/13/2019 Medical Rec #:  [id number]    Height:       63.0 in Accession #:    [id number]   Weight:       185.0 lb Date of Birth:  17-Jun-1940    BSA:          1.871 m Patient Age:    77 years     BP:           109/61 mmHg Patient Gender: F            HR:           70 bpm.  Exam Location:  Inpatient Procedure: 2D Echo, Color Doppler and Cardiac Doppler Indications:    R55 Syncope  History:        Patient has prior history of  Echocardiogram examinations, most                 recent 05/23/2018. Aortic Valve Disease. Recent fall.  Sonographer:    Merrie Roof RDCS Referring Phys: [id number] [place]  1. Left ventricular ejection fraction, by estimation, is 60 to 65%. The left ventricle has normal function. The left ventricle has no regional wall motion abnormalities. There is moderate left ventricular hypertrophy. Left ventricular diastolic parameters were normal.  2. Right ventricular systolic function is normal. The right ventricular size is normal. There is severely elevated pulmonary artery systolic pressure.  3. Left atrial size was mildly dilated.  4. The mitral valve is grossly normal. No evidence of mitral valve regurgitation.  5. The aortic valve is tricuspid. Aortic valve regurgitation is mild. Mild aortic valve stenosis. Aortic valve area, by VTI measures 1.52 cm. Aortic valve mean gradient measures 11.5 mmHg. Aortic valve Vmax measures 2.50 m/s.  6. The inferior vena cava is dilated in size with <50% respiratory variability, suggesting right atrial pressure of 15 mmHg. Comparison(s): Changes from prior study are noted. LVEF 55-60%, mild AS - mean gradient 10 mmHg. FINDINGS  Left Ventricle: Left ventricular ejection fraction, by estimation, is 60 to 65%. The left ventricle has normal function. The left ventricle has no regional wall motion abnormalities. The left ventricular internal cavity size was normal in size. There is  moderate left ventricular hypertrophy. Left ventricular diastolic parameters were normal. Right Ventricle: The right ventricular size is normal. No increase in right ventricular wall thickness. Right ventricular systolic function is normal. There is severely elevated pulmonary artery systolic pressure. The tricuspid regurgitant velocity is 3.92  m/s, and with an assumed right atrial pressure of 15 mmHg, the estimated right ventricular systolic pressure is 58.5 mmHg. Left Atrium: Left atrial size was mildly dilated. Right Atrium: Right atrial size was normal in size. Pericardium: There is no evidence of pericardial effusion. Mitral Valve: The mitral valve is grossly normal. No evidence of mitral valve regurgitation. Tricuspid Valve: The tricuspid valve is grossly normal. Tricuspid valve regurgitation is mild. Aortic Valve: The aortic valve is tricuspid. Aortic valve regurgitation is mild. Aortic regurgitation PHT measures 533 msec. Mild aortic stenosis is present. Aortic valve mean gradient measures 11.5 mmHg. Aortic valve peak gradient measures 25.0 mmHg. Aortic valve area, by VTI measures 1.52 cm. Pulmonic Valve: The pulmonic valve was grossly normal. Pulmonic valve regurgitation is trivial. Aorta: The aortic root and ascending aorta are structurally normal, with no evidence of dilitation. Venous: The inferior vena cava is dilated in size with less than 50% respiratory variability, suggesting right atrial pressure of 15 mmHg. IAS/Shunts: No atrial level shunt detected by color flow Doppler. Additional Comments: A pacer wire is visualized.  LEFT VENTRICLE PLAX 2D LVIDd:         4.80 cm     Diastology LVIDs:         3.40 cm     LV e' lateral:   9.79 cm/s LV PW:         1.20 cm     LV E/e' lateral: 9.8 LV IVS:        1.20 cm     LV e' medial:    8.49 cm/s LVOT diam:     2.00 cm     LV E/e' medial:  11.3 LV SV:         57 LV SV Index:   31 LVOT Area:     3.14 cm  LV Volumes (  MOD) LV vol d, MOD A4C: 70.5 ml LV vol s, MOD A4C: 22.6 ml LV SV MOD A4C:     70.5 ml RIGHT VENTRICLE             IVC RV S prime:     12.10 cm/s  IVC diam: 2.70 cm TAPSE (M-mode): 2.0 cm LEFT ATRIUM              Index LA diam:        4.80 cm  2.57 cm/m LA Vol (A2C):   120.0 ml 64.15 ml/m LA Vol (A4C):   69.6 ml  37.21 ml/m LA Biplane Vol: 91.2 ml  48.75 ml/m  AORTIC VALVE AV Area  (Vmax):    1.31 cm AV Area (Vmean):   1.42 cm AV Area (VTI):     1.52 cm AV Vmax:           250.00 cm/s AV Vmean:          155.000 cm/s AV VTI:            0.377 m AV Peak Grad:      25.0 mmHg AV Mean Grad:      11.5 mmHg LVOT Vmax:         104.00 cm/s LVOT Vmean:        70.100 cm/s LVOT VTI:          0.182 m LVOT/AV VTI ratio: 0.48 AI PHT:            533 msec  AORTA Ao Root diam: 3.50 cm Ao Asc diam:  3.45 cm MITRAL VALVE               TRICUSPID VALVE MV Area (PHT): 4.15 cm    TR Peak grad:   61.5 mmHg MV Decel Time: 183 msec    TR Vmax:        392.00 cm/s MV E velocity: 96.30 cm/s MV A velocity: 55.20 cm/s  SHUNTS MV E/A ratio:  1.74        Systemic VTI:  0.18 m                            Systemic Diam: 2.00 cm Lyman Bishop MD Electronically signed by Lyman Bishop MD Signature Date/Time: 08/13/2019/10:06:36 AM    Final    US Abdomen Limited RUQ  Result Date: 08/12/2019 CLINICAL DATA:  Abdominal pain. EXAM: ULTRASOUND ABDOMEN LIMITED RIGHT UPPER QUADRANT COMPARISON:  None. FINDINGS: Gallbladder: Surgically absent. Common bile duct: Diameter: 4.4 mm Liver: Increased echogenicity with no focal masses. Portal vein is patent on color Doppler imaging with normal direction of blood flow towards the liver. Other: None. IMPRESSION: 1. Previous cholecystectomy. 2. No biliary duct dilatation. 3. Probable hepatic steatosis. Electronically Signed   By: Dorise Bullion III M.D   On: 08/12/2019 18:55      Subjective: Patient seen and examined the bedside this morning.  Hemodynamically stable for discharge today.  Discharge Exam: Vitals:   08/18/19 0432 08/18/19 0730  BP: 139/62 (!) 171/81  Pulse: 70 70  Resp: 18 18  Temp: (!) 97.3 F (36.3 C) 98 F (36.7 C)  SpO2: 99% 100%   Vitals:   08/17/19 2355 08/18/19 0321 08/18/19 0432 08/18/19 0730  BP: (!) 151/63 (!) 157/60 139/62 (!) 171/81  Pulse: 70 70 70 70  Resp:  15 18 18   Temp: 98 F (36.7 C) (!) 97.4 F (36.3 C) (!) 97.3 F (36.3 C)  75 F (36.7  C)  TempSrc: Axillary Axillary Oral   SpO2:  100% 99% 100%    General: Pt is alert, awake, not in acute distress Cardiovascular: RRR, S1/S2 +, no rubs, no gallops Respiratory: CTA bilaterally, no wheezing, no rhonchi Abdominal: Soft, NT, ND, bowel sounds + Extremities: no edema, no cyanosis    The results of significant diagnostics from this hospitalization (including imaging, microbiology, ancillary and laboratory) are listed below for reference.     Microbiology: Recent Results (from the past 240 hour(s))  SARS Coronavirus 2 by RT PCR (hospital order, performed in [facility] hospital lab) Nasopharyngeal Nasopharyngeal Swab     Status: None   Collection Time: 08/12/19  4:44 PM   Specimen: Nasopharyngeal Swab  Result Value Ref Range Status   SARS Coronavirus 2 NEGATIVE NEGATIVE Final    Comment: (NOTE) SARS-CoV-2 target nucleic acids are NOT DETECTED. The SARS-CoV-2 RNA is generally detectable in upper and lower respiratory specimens during the acute phase of infection. The lowest concentration of SARS-CoV-2 viral copies this assay can detect is 250 copies / mL. A negative result does not preclude SARS-CoV-2 infection and should not be used as the sole basis for treatment or other patient management decisions.  A negative result may occur with improper specimen collection / handling, submission of specimen other than nasopharyngeal swab, presence of viral mutation(s) within the areas targeted by this assay, and inadequate number of viral copies (<250 copies / mL). A negative result must be combined with clinical observations, patient history, and epidemiological information. Fact Sheet for Patients:   StrictlyIdeas.no Fact Sheet for Healthcare Providers: BankingDealers.co.za This test is not yet approved or cleared  by the [place] FDA and has been authorized for detection and/or diagnosis of SARS-CoV-2 by FDA under an  Emergency Use Authorization (EUA).  This EUA will remain in effect (meaning this test can be used) for the duration of the COVID-19 declaration under Section 564(b)(1) of the Act, 21 U.S.C. section 360bbb-3(b)(1), unless the authorization is terminated or revoked sooner. Performed at [facility], [place] [address], [place], [place] [address]   Culture, blood (routine x 2)     Status: None   Collection Time: 08/12/19  5:55 PM   Specimen: BLOOD  Result Value Ref Range Status   Specimen Description BLOOD BLOOD LEFT HAND  Final   Special Requests   Final    BOTTLES DRAWN AEROBIC AND ANAEROBIC Blood Culture results may not be optimal due to an inadequate volume of blood received in culture bottles   Culture   Final    NO GROWTH 5 DAYS Performed at [facility], [place] [address] [place], [place] [address]    Report Status 08/17/2019 FINAL  Final  Culture, blood (routine x 2)     Status: None   Collection Time: 08/13/19  6:54 AM   Specimen: BLOOD  Result Value Ref Range Status   Specimen Description BLOOD SITE NOT SPECIFIED  Final   Special Requests   Final    BOTTLES DRAWN AEROBIC AND ANAEROBIC Blood Culture adequate volume   Culture   Final    NO GROWTH 5 DAYS Performed at [facility], [place] [address], [place], [place] [address]    Report Status 08/18/2019 FINAL  Final  SARS Coronavirus 2 by RT PCR (hospital order, performed in [facility] hospital lab) Nasopharyngeal Nasopharyngeal Swab     Status: None   Collection Time: 08/17/19  5:45 PM   Specimen:  Nasopharyngeal Swab  Result Value Ref Range Status   SARS Coronavirus 2 NEGATIVE NEGATIVE Final    Comment: (NOTE) SARS-CoV-2 target nucleic acids are NOT DETECTED. The SARS-CoV-2 RNA is generally detectable in upper and lower respiratory specimens during the acute phase of infection. The lowest concentration of SARS-CoV-2 viral copies this assay can detect is 250 copies / mL. A negative result does not preclude  SARS-CoV-2 infection and should not be used as the sole basis for treatment or other patient management decisions.  A negative result may occur with improper specimen collection / handling, submission of specimen other than nasopharyngeal swab, presence of viral mutation(s) within the areas targeted by this assay, and inadequate number of viral copies (<250 copies / mL). A negative result must be combined with clinical observations, patient history, and epidemiological information. Fact Sheet for Patients:   StrictlyIdeas.no Fact Sheet for Healthcare Providers: BankingDealers.co.za This test is not yet approved or cleared  by the [place] FDA and has been authorized for detection and/or diagnosis of SARS-CoV-2 by FDA under an Emergency Use Authorization (EUA).  This EUA will remain in effect (meaning this test can be used) for the duration of the COVID-19 declaration under Section 564(b)(1) of the Act, 21 U.S.C. section 360bbb-3(b)(1), unless the authorization is terminated or revoked sooner. Performed at [facility], [place] [address] [place], [place] [address]      Labs: BNP (last 3 results) Recent Labs    08/12/19 1349 08/13/19 0654  BNP 518.4* 758.8*   Basic Metabolic Panel: Recent Labs  Lab 08/13/19 0654 08/14/19 0719 08/15/19 0657 08/16/19 0537 08/17/19 0819  NA 131* 133* 126* 125* 133*  K 4.1 4.0 4.0 3.9 3.8  CL 95* 97* 93* 92* 96*  CO2 21* 23 19* 22 25  GLUCOSE 86 91 90 84 89  BUN 45* 42* 43* 53* 45*  CREATININE 2.49* 2.23* 2.02* 2.26* 1.72*  CALCIUM 7.2* 7.5* 7.7* 7.7* 8.4*   Liver Function Tests: Recent Labs  Lab 08/12/19 1349 08/13/19 0654  AST 27 24  ALT 19 17  ALKPHOS 59 58  BILITOT 2.5* 2.7*  PROT 6.8 5.1*  ALBUMIN 3.2* 2.3*   No results for input(s): LIPASE, AMYLASE in the last 168 hours. No results for input(s): AMMONIA in the last 168 hours. CBC: Recent Labs  Lab 08/12/19 1349  08/13/19 0654 08/14/19 0719 08/15/19 0657 08/16/19 0537  WBC 20.3* 19.4* 16.8* 13.3* 9.3  NEUTROABS  --   --   --   --  7.3  HGB 11.6* 11.0* 11.0* 11.0* 10.7*  HCT 37.2 33.8* 33.7* 33.5* 33.3*  MCV 92.1 88.5 86.6 86.8 86.5  PLT 258 218 226 234 262   Cardiac Enzymes: Recent Labs  Lab 08/13/19 0654  CKTOTAL 50   BNP: Invalid input(s): POCBNP CBG: No results for input(s): GLUCAP in the last 168 hours. D-Dimer No results for input(s): DDIMER in the last 72 hours. Hgb A1c No results for input(s): HGBA1C in the last 72 hours. Lipid Profile No results for input(s): CHOL, HDL, LDLCALC, TRIG, CHOLHDL, LDLDIRECT in the last 72 hours. Thyroid function studies Recent Labs    08/16/19 0829  TSH 9.253*   Anemia work up No results for input(s): VITAMINB12, FOLATE, FERRITIN, TIBC, IRON, RETICCTPCT in the last 72 hours. Urinalysis    Component Value Date/Time   COLORURINE YELLOW 08/12/2019 1556   APPEARANCEUR CLEAR 08/12/2019 1556   LABSPEC 1.009 08/12/2019 1556   PHURINE 5.0 08/12/2019 1556   [address] 08/12/2019  [place] 08/12/2019 [place] 08/12/2019 [place] 08/12/2019 [place] 08/12/2019 1556   UROBILINOGEN 0.2 05/08/2014 1232   NITRITE NEGATIVE 08/12/2019 1556   LEUKOCYTESUR NEGATIVE 08/12/2019 1556   Sepsis Labs Invalid input(s): PROCALCITONIN,  WBC,  LACTICIDVEN Microbiology Recent Results (from the past 240 hour(s))  SARS Coronavirus 2 by RT PCR (hospital order, performed in [facility] hospital lab) Nasopharyngeal Nasopharyngeal Swab     Status: None   Collection Time: 08/12/19  4:44 PM   Specimen: Nasopharyngeal Swab  Result Value Ref Range Status   SARS Coronavirus 2 NEGATIVE NEGATIVE Final    Comment: (NOTE) SARS-CoV-2 target nucleic acids are NOT DETECTED. The SARS-CoV-2 RNA is generally detectable in upper and lower respiratory specimens during the acute phase of infection. The  lowest concentration of SARS-CoV-2 viral copies this assay can detect is 250 copies / mL. A negative result does not preclude SARS-CoV-2 infection and should not be used as the sole basis for treatment or other patient management decisions.  A negative result may occur with improper specimen collection / handling, submission of specimen other than nasopharyngeal swab, presence of viral mutation(s) within the areas targeted by this assay, and inadequate number of viral copies (<250 copies / mL). A negative result must be combined with clinical observations, patient history, and epidemiological information. Fact Sheet for Patients:   StrictlyIdeas.no Fact Sheet for Healthcare Providers: BankingDealers.co.za This test is not yet approved or cleared  by the [place] FDA and has been authorized for detection and/or diagnosis of SARS-CoV-2 by FDA under an Emergency Use Authorization (EUA).  This EUA will remain in effect (meaning this test can be used) for the duration of the COVID-19 declaration under Section 564(b)(1) of the Act, 21 U.S.C. section 360bbb-3(b)(1), unless the authorization is terminated or revoked sooner. Performed at [facility], [place] [address], [place], [place] [address]   Culture, blood (routine x 2)     Status: None   Collection Time: 08/12/19  5:55 PM   Specimen: BLOOD  Result Value Ref Range Status   Specimen Description BLOOD BLOOD LEFT HAND  Final   Special Requests   Final    BOTTLES DRAWN AEROBIC AND ANAEROBIC Blood Culture results may not be optimal due to an inadequate volume of blood received in culture bottles   Culture   Final    NO GROWTH 5 DAYS Performed at [facility], [place] [address], [place], [place] [address]    Report Status 08/17/2019 FINAL  Final  Culture, blood (routine x 2)     Status: None   Collection Time: 08/13/19  6:54 AM   Specimen: BLOOD  Result Value Ref Range Status    Specimen Description BLOOD SITE NOT SPECIFIED  Final   Special Requests   Final    BOTTLES DRAWN AEROBIC AND ANAEROBIC Blood Culture adequate volume   Culture   Final    NO GROWTH 5 DAYS Performed at [facility], [place] [address], [place], [place] [address]    Report Status 08/18/2019 FINAL  Final  SARS Coronavirus 2 by RT PCR (hospital order, performed in [facility] hospital lab) Nasopharyngeal Nasopharyngeal Swab     Status: None   Collection Time: 08/17/19  5:45 PM   Specimen: Nasopharyngeal Swab  Result Value Ref Range Status   SARS Coronavirus 2 NEGATIVE NEGATIVE Final    Comment: (NOTE) SARS-CoV-2 target nucleic acids are NOT  DETECTED. The SARS-CoV-2 RNA is generally detectable in upper and lower respiratory specimens during the acute phase of infection. The lowest concentration of SARS-CoV-2 viral copies this assay can detect is 250 copies / mL. A negative result does not preclude SARS-CoV-2 infection and should not be used as the sole basis for treatment or other patient management decisions.  A negative result may occur with improper specimen collection / handling, submission of specimen other than nasopharyngeal swab, presence of viral mutation(s) within the areas targeted by this assay, and inadequate number of viral copies (<250 copies / mL). A negative result must be combined with clinical observations, patient history, and epidemiological information. Fact Sheet for Patients:   StrictlyIdeas.no Fact Sheet for Healthcare Providers: BankingDealers.co.za This test is not yet approved or cleared  by the [place] FDA and has been authorized for detection and/or diagnosis of SARS-CoV-2 by FDA under an Emergency Use Authorization (EUA).  This EUA will remain in effect (meaning this test can be used) for the duration of the COVID-19 declaration under Section 564(b)(1) of the Act, 21 U.S.C. section 360bbb-3(b)(1), unless  the authorization is terminated or revoked sooner. Performed at [facility], [place] [address] [place], [place] [address]     Please note: You were cared for by a hospitalist during your hospital stay. Once you are discharged, your primary care physician will handle any further medical issues. Please note that NO REFILLS for any discharge medications will be authorized once you are discharged, as it is imperative that you return to your primary care physician (or establish a relationship with a primary care physician if you do not have one) for your post hospital discharge needs so that they can reassess your need for medications and monitor your lab values.    Time coordinating discharge: 40 minutes  SIGNED:   Shelly Coss, MD  [facility] 08/18/2019, 8:51 AM Pager [phone]  If 7PM-7AM, please contact night-coverage [email or web address] Password [email or web address]

## 2019-08-17 NOTE — Progress Notes (Addendum)
[facility] NEPHROLOGY PROGRESS NOTE  Assessment/ Plan: Pt is a 79 y.o. yo female with A. fib, CHF, PVD, COPD, RA on leflunomide, gout, admitted on 5/30 after a fall, LOC thought to be due to polypharmacy, CKD 3 with baseline creatinine level 1.3-1.4 followed by Dr. Marval Regal, seen as a consultation for AKI on CKD and hyponatremia.  #Acute on CKD stage III likely hemodynamically mediated.  Urinalysis was bland and kidney ultrasound ruled out obstruction.  Creatinine level trended down to 1.72 today.  She is nonoliguric. Recommend to resume torsemide 40 mg twice a day on discharge. I will send message to [place] [facility] to arrange follow-up with Dr. Marval Regal.  #Asymptomatic hyponatremia, hypervolemic: Sodium level has improved.  Resume torsemide however discontinue metolazone on discharge.  Recommend fluid restriction to 1.5 L/24 hours.  #Fall, acute metabolic encephalopathy: Thought to be due to polypharmacy.  Likely discharge to SNF. Per primary team.  #Chronic atrial fibrillation: Has pacemaker.  On chronic anticoagulation.  #History of CHF: Resuming torsemide.  Okay to discharge from renal perspective, please call back with question.  Subjective: Seen and examined at bedside.  Sitting on chair comfortable.  Denies nausea, vomiting, chest pain, shortness of breath.  Urine output 2.9 L. Objective Vital signs in last 24 hours: Vitals:   08/16/19 1959 08/16/19 2323 08/17/19 0350 08/17/19 0758  BP: (!) 155/71 (!) 149/76 (!) 154/72 (!) 148/72  Pulse: 68 70 70 69  Resp: 17 20 17    Temp: 97.8 F (36.6 C) 97.7 F (36.5 C) 98 F (36.7 C)   TempSrc:  Axillary    SpO2: 97% 96% 97% 100%   Weight change:   Intake/Output Summary (Last 24 hours) at 08/17/2019 1146 Last data filed at 08/17/2019 0800 Gross per 24 hour  Intake 270 ml  Output 1400 ml  Net -1130 ml       Labs: Basic Metabolic Panel: Recent Labs  Lab 08/15/19 0657 08/16/19 0537 08/17/19 0819   NA 126* 125* 133*  K 4.0 3.9 3.8  CL 93* 92* 96*  CO2 19* 22 25  GLUCOSE 90 84 89  BUN 43* 53* 45*  CREATININE 2.02* 2.26* 1.72*  CALCIUM 7.7* 7.7* 8.4*   Liver Function Tests: Recent Labs  Lab 08/12/19 1349 08/13/19 0654  AST 27 24  ALT 19 17  ALKPHOS 59 58  BILITOT 2.5* 2.7*  PROT 6.8 5.1*  ALBUMIN 3.2* 2.3*   No results for input(s): LIPASE, AMYLASE in the last 168 hours. No results for input(s): AMMONIA in the last 168 hours. CBC: Recent Labs  Lab 08/12/19 1349 08/12/19 1349 08/13/19 0654 08/13/19 0654 08/14/19 0719 08/15/19 0657 08/16/19 0537  WBC 20.3*   < > 19.4*   < > 16.8* 13.3* 9.3  NEUTROABS  --   --   --   --   --   --  7.3  HGB 11.6*   < > 11.0*   < > 11.0* 11.0* 10.7*  HCT 37.2   < > 33.8*   < > 33.7* 33.5* 33.3*  MCV 92.1  --  88.5  --  86.6 86.8 86.5  PLT 258   < > 218   < > 226 234 262   < > = values in this interval not displayed.   Cardiac Enzymes: Recent Labs  Lab 08/13/19 0654  CKTOTAL 50   CBG: No results for input(s): GLUCAP in the last 168 hours.  Iron Studies: No results for input(s): IRON, TIBC, TRANSFERRIN, FERRITIN in the  last 72 hours. Studies/Results: No results found.  Medications: Infusions:   Scheduled Medications: . aspirin EC  81 mg Oral Daily  . levothyroxine  125 mcg Oral QAC breakfast  . montelukast  10 mg Oral Daily  . potassium chloride SA  20 mEq Oral Daily  . pravastatin  20 mg Oral QHS  . Rivaroxaban  15 mg Oral Q supper    have reviewed scheduled and prn medications.  Physical Exam: General:NAD, comfortable Heart:RRR, s1s2 nl Lungs:clear b/l, no crackle Abdomen:soft, Non-tender, non-distended Extremities: Trace LE edema Neurology: Alert, awake, following commands, no asterixis  Ambrose Wile Tanna Furry 08/17/2019,11:46 AM  LOS: 5 days  Pager: [phone]

## 2019-08-17 NOTE — TOC Progression Note (Signed)
Transition of Care [facility]) - Progression Note    Patient Details  Name: Katie [name] MRN: [id number] Date of Birth: [birth year]  [facility] CM/SW Contact  Bartholomew Crews, RN Phone Number: [phone] 08/17/2019, 2:45 PM  Clinical Narrative:     Patient wanting to transition home with home health PT and OT rather than go to SNF. Spoke with patient at bedside - discussed agency choice for home health. Advised that it is likely home health will not be able to start services until Monday or Tuesday next week. Patient stated that this would be fine. Referral accepted by [facility] for PT and OT with start of care on Tuesday. HH orders placed by MD. Patient agreed to 3N1. Referral to [facility] for delivery to the room. Patient is on chronic oxygen. States that she gets her oxygen from [employer]. Advised to have her family bring her oxygen for transportation home. TOC following for transition needs.   Expected Discharge Plan: [place] Barriers to Discharge: Continued Medical Work up  Expected Discharge Plan and Services Expected Discharge Plan: [place] In-house Referral: Clinical Social Work Discharge Planning Services: CM Consult   Living arrangements for the past 2 months: Single Family Home Expected Discharge Date: 08/17/19                                     Social Determinants of Health (SDOH) Interventions    Readmission Risk Interventions No flowsheet data found.

## 2019-08-18 DIAGNOSIS — W19XXXD Unspecified fall, subsequent encounter: Secondary | ICD-10-CM | POA: Diagnosis not present

## 2019-08-18 DIAGNOSIS — M0680 Other specified rheumatoid arthritis, unspecified site: Secondary | ICD-10-CM | POA: Diagnosis not present

## 2019-08-18 DIAGNOSIS — Z7401 Bed confinement status: Secondary | ICD-10-CM | POA: Diagnosis not present

## 2019-08-18 DIAGNOSIS — I482 Chronic atrial fibrillation, unspecified: Secondary | ICD-10-CM | POA: Diagnosis not present

## 2019-08-18 DIAGNOSIS — G4700 Insomnia, unspecified: Secondary | ICD-10-CM | POA: Diagnosis not present

## 2019-08-18 DIAGNOSIS — Z743 Need for continuous supervision: Secondary | ICD-10-CM | POA: Diagnosis not present

## 2019-08-18 DIAGNOSIS — J449 Chronic obstructive pulmonary disease, unspecified: Secondary | ICD-10-CM | POA: Diagnosis not present

## 2019-08-18 DIAGNOSIS — I739 Peripheral vascular disease, unspecified: Secondary | ICD-10-CM | POA: Diagnosis not present

## 2019-08-18 DIAGNOSIS — M6281 Muscle weakness (generalized): Secondary | ICD-10-CM | POA: Diagnosis not present

## 2019-08-18 DIAGNOSIS — G9341 Metabolic encephalopathy: Secondary | ICD-10-CM | POA: Diagnosis not present

## 2019-08-18 DIAGNOSIS — R2681 Unsteadiness on feet: Secondary | ICD-10-CM | POA: Diagnosis not present

## 2019-08-18 DIAGNOSIS — M109 Gout, unspecified: Secondary | ICD-10-CM | POA: Diagnosis not present

## 2019-08-18 DIAGNOSIS — J45902 Unspecified asthma with status asthmaticus: Secondary | ICD-10-CM | POA: Diagnosis not present

## 2019-08-18 DIAGNOSIS — R296 Repeated falls: Secondary | ICD-10-CM | POA: Diagnosis not present

## 2019-08-18 DIAGNOSIS — I4891 Unspecified atrial fibrillation: Secondary | ICD-10-CM | POA: Diagnosis not present

## 2019-08-18 DIAGNOSIS — G9340 Encephalopathy, unspecified: Secondary | ICD-10-CM | POA: Diagnosis not present

## 2019-08-18 DIAGNOSIS — M2550 Pain in unspecified joint: Secondary | ICD-10-CM | POA: Diagnosis not present

## 2019-08-18 DIAGNOSIS — I509 Heart failure, unspecified: Secondary | ICD-10-CM | POA: Diagnosis not present

## 2019-08-18 DIAGNOSIS — R5381 Other malaise: Secondary | ICD-10-CM | POA: Diagnosis not present

## 2019-08-18 DIAGNOSIS — N179 Acute kidney failure, unspecified: Secondary | ICD-10-CM | POA: Diagnosis not present

## 2019-08-18 DIAGNOSIS — R410 Disorientation, unspecified: Secondary | ICD-10-CM | POA: Diagnosis not present

## 2019-08-18 LAB — CULTURE, BLOOD (ROUTINE X 2)
Culture: NO GROWTH
Special Requests: ADEQUATE

## 2019-08-18 MED ORDER — TRAMADOL HCL 50 MG PO TABS: 50.0000 mg | ORAL_TABLET | Freq: Four times a day (QID) | ORAL | 0 refills | Status: DC | PRN

## 2019-08-18 NOTE — Plan of Care (Signed)

## 2019-08-18 NOTE — Progress Notes (Signed)
PROGRESS NOTE    Katie [name]  ZTI:[id number] DOB: October 27, 1940 DOA: 08/12/2019 PCP: [name], MD   Brief Narrative: Patient is a 79 year old female with history of migraines, chronic A. fib, chronic systolic congestive heart failure, nonischemic cardiomyopathy with improved ejection fraction, status post ICD, COPD, chronic pain syndrome, peripheral neuropathy, peripheral vascular disease who presented to the emergency department with complaints of falls, increased confusion. She had 3 unwitnessed falls in the last 1 to 2 weeks, no loss of consciousness. On presentation she had AKI with creatinine of 2.7,leukocytosis. Also found to have elevated digoxin level.  Hospital course also remarkable for hyponatremia.  Her kidney function and sodium level have improved today.PT evaluated her and recommended skilled nursing facility placement.   She is hemodynamically stable for discharge today.  Assessment & Plan:   Active Problems:   Acute encephalopathy Falls/confusion: Multiple etiology suspected. Could be from AKI, polypharmacy. She was on gabapentin, baclofen, Benadryl, tizanidine. Pacemaker interrogation was done with no abnormal findings. 2D echocardiogram showed normal ejection fraction. Exam was nonfocal. CT head did not show any acute findings. Mental status has improved considerably. She has severe neuropathy so gabapentin has been restarted at low-dose. PT/OT evaluation completed and recommended skilled nursing facility placement but she opted home health.  Currently she is alert and oriented.  AKI on CKD stage IIIb: Baseline creatinine around 1.3-1.5. Creatinine was 2.8 on presentation, suspected to be from overdiuresis. She was hydrated with 3 L of fluid in the emergency department. IV fluids have been stopped. She was on torsemide and metolazone at home. Renal ultrasound did not show any hydronephrosis. We restarted torsemide on discharge.  History of chronic  systolic/diastolic CHF with biventricular ASN:[id number] echocardiogram showed improvement in ejection fraction of 60%. Discussed with cardiology, pacemaker interrogation done ,result was found to be normal. Restart torsemide. Metolazone can be stopped. Digoxin was elevated on presentation. Will hold given improvement in the ejection fraction.  Hyponatremia:Asymptomatic.improved today  COPD/chronic respiratory failure: On home oxygen at night. Currently stable  Leukocytosis:Resolved. Most likely reactive.  Mild hyperbilirubinemia: Most likely secondary to fatty liver disease. Right upper quadrant ultrasound is unremarkable.  Chronic A. fib: Rate is controlled. Continue Xarelto  History of rheumatoid arthritis/gout:On leflunomide at home. Will resume on discharge.          DVT prophylaxis: Xarelto Code Status: Full Family Communication: Granddaughter  On phone on 08/17/19 Status is: Inpatient  Remains inpatient appropriate because:Unsafe d/c plan   Dispo: The patient is from: Home              Anticipated d/c is to: SNF              Anticipated d/c date BH:ALPFX              Patient currently is medically stable to d/c.     Consultants: None  Procedures: None  Antimicrobials:  Anti-infectives (From admission, onward)   None      Subjective: Patient seen and examined the bedside this morning.  Hemodynamically stable.  Comfortable.  Sitting in the chair.  Stable for discharge today.  Objective: Vitals:   08/17/19 2355 08/18/19 0321 08/18/19 0432 08/18/19 0730  BP: (!) 151/63 (!) 157/60 139/62 (!) 171/81  Pulse: 70 70 70 70  Resp:  15 18 18   Temp: 98 F (36.7 C) (!) 97.4 F (36.3 C) (!) 97.3 F (36.3 C) 98 F (36.7 C)  TempSrc: Axillary Axillary Oral   SpO2:  100% 99% 100%    Intake/Output  Summary (Last 24 hours) at 08/18/2019 1048 Last data filed at 08/18/2019 5726 Gross per 24 hour  Intake --  Output 350 ml  Net -350 ml   There were no  vitals filed for this visit.  Examination:  General exam: Appears calm and comfortable ,Not in distress,average built HEENT:PERRL,Oral mucosa moist, Ear/Nose normal on gross exam Respiratory system: Bilaterally clear Cardiovascular system: Afib, No JVD, murmurs, rubs, gallops or clicks. Gastrointestinal system: Abdomen is nondistended, soft and nontender. No organomegaly or masses felt. Normal bowel sounds heard. Central nervous system: Alert and oriented. No focal neurological deficits. Extremities: No edema, no clubbing ,no cyanosis Skin: No rashes, lesions or ulcers,no icterus ,no pallor   Data Reviewed: I have personally reviewed following labs and imaging studies  CBC: Recent Labs  Lab 08/12/19 1349 08/13/19 0654 08/14/19 0719 08/15/19 0657 08/16/19 0537  WBC 20.3* 19.4* 16.8* 13.3* 9.3  NEUTROABS  --   --   --   --  7.3  HGB 11.6* 11.0* 11.0* 11.0* 10.7*  HCT 37.2 33.8* 33.7* 33.5* 33.3*  MCV 92.1 88.5 86.6 86.8 86.5  PLT 258 218 226 234 203   Basic Metabolic Panel: Recent Labs  Lab 08/13/19 0654 08/14/19 0719 08/15/19 0657 08/16/19 0537 08/17/19 0819  NA 131* 133* 126* 125* 133*  K 4.1 4.0 4.0 3.9 3.8  CL 95* 97* 93* 92* 96*  CO2 21* 23 19* 22 25  GLUCOSE 86 91 90 84 89  BUN 45* 42* 43* 53* 45*  CREATININE 2.49* 2.23* 2.02* 2.26* 1.72*  CALCIUM 7.2* 7.5* 7.7* 7.7* 8.4*   GFR: Estimated Creatinine Clearance: 28.1 mL/min (A) (by C-G formula based on SCr of 1.72 mg/dL (H)). Liver Function Tests: Recent Labs  Lab 08/12/19 1349 08/13/19 0654  AST 27 24  ALT 19 17  ALKPHOS 59 58  BILITOT 2.5* 2.7*  PROT 6.8 5.1*  ALBUMIN 3.2* 2.3*   No results for input(s): LIPASE, AMYLASE in the last 168 hours. No results for input(s): AMMONIA in the last 168 hours. Coagulation Profile: No results for input(s): INR, PROTIME in the last 168 hours. Cardiac Enzymes: Recent Labs  Lab 08/13/19 0654  CKTOTAL 50   BNP (last 3 results) No results for input(s): PROBNP  in the last 8760 hours. HbA1C: No results for input(s): HGBA1C in the last 72 hours. CBG: No results for input(s): GLUCAP in the last 168 hours. Lipid Profile: No results for input(s): CHOL, HDL, LDLCALC, TRIG, CHOLHDL, LDLDIRECT in the last 72 hours. Thyroid Function Tests: Recent Labs    08/16/19 0829  TSH 9.253*   Anemia Panel: No results for input(s): VITAMINB12, FOLATE, FERRITIN, TIBC, IRON, RETICCTPCT in the last 72 hours. Sepsis Labs: Recent Labs  Lab 08/12/19 1349  PROCALCITON 1.28    Recent Results (from the past 240 hour(s))  SARS Coronavirus 2 by RT PCR (hospital order, performed in [facility] hospital lab) Nasopharyngeal Nasopharyngeal Swab     Status: None   Collection Time: 08/12/19  4:44 PM   Specimen: Nasopharyngeal Swab  Result Value Ref Range Status   SARS Coronavirus 2 NEGATIVE NEGATIVE Final    Comment: (NOTE) SARS-CoV-2 target nucleic acids are NOT DETECTED. The SARS-CoV-2 RNA is generally detectable in upper and lower respiratory specimens during the acute phase of infection. The lowest concentration of SARS-CoV-2 viral copies this assay can detect is 250 copies / mL. A negative result does not preclude SARS-CoV-2 infection and should not be used as the sole basis for treatment or other patient  management decisions.  A negative result may occur with improper specimen collection / handling, submission of specimen other than nasopharyngeal swab, presence of viral mutation(s) within the areas targeted by this assay, and inadequate number of viral copies (<250 copies / mL). A negative result must be combined with clinical observations, patient history, and epidemiological information. Fact Sheet for Patients:   StrictlyIdeas.no Fact Sheet for Healthcare Providers: BankingDealers.co.za This test is not yet approved or cleared  by the [place] FDA and has been authorized for detection and/or diagnosis of  SARS-CoV-2 by FDA under an Emergency Use Authorization (EUA).  This EUA will remain in effect (meaning this test can be used) for the duration of the COVID-19 declaration under Section 564(b)(1) of the Act, 21 U.S.C. section 360bbb-3(b)(1), unless the authorization is terminated or revoked sooner. Performed at [facility], [place] [address] [place], [place] [address]   Culture, blood (routine x 2)     Status: None   Collection Time: 08/12/19  5:55 PM   Specimen: BLOOD  Result Value Ref Range Status   Specimen Description BLOOD BLOOD LEFT HAND  Final   Special Requests   Final    BOTTLES DRAWN AEROBIC AND ANAEROBIC Blood Culture results may not be optimal due to an inadequate volume of blood received in culture bottles   Culture   Final    NO GROWTH 5 DAYS Performed at [facility], [place] [address] [place], [place] [address]    Report Status 08/17/2019 FINAL  Final  Culture, blood (routine x 2)     Status: None   Collection Time: 08/13/19  6:54 AM   Specimen: BLOOD  Result Value Ref Range Status   Specimen Description BLOOD SITE NOT SPECIFIED  Final   Special Requests   Final    BOTTLES DRAWN AEROBIC AND ANAEROBIC Blood Culture adequate volume   Culture   Final    NO GROWTH 5 DAYS Performed at [facility], [place] [address] [place], [place] [address]    Report Status 08/18/2019 FINAL  Final  SARS Coronavirus 2 by RT PCR (hospital order, performed in [facility] hospital lab) Nasopharyngeal Nasopharyngeal Swab     Status: None   Collection Time: 08/17/19  5:45 PM   Specimen: Nasopharyngeal Swab  Result Value Ref Range Status   SARS Coronavirus 2 NEGATIVE NEGATIVE Final    Comment: (NOTE) SARS-CoV-2 target nucleic acids are NOT DETECTED. The SARS-CoV-2 RNA is generally detectable in upper and lower respiratory specimens during the acute phase of infection. The lowest concentration of SARS-CoV-2 viral copies this assay can detect is 250 copies / mL. A negative  result does not preclude SARS-CoV-2 infection and should not be used as the sole basis for treatment or other patient management decisions.  A negative result may occur with improper specimen collection / handling, submission of specimen other than nasopharyngeal swab, presence of viral mutation(s) within the areas targeted by this assay, and inadequate number of viral copies (<250 copies / mL). A negative result must be combined with clinical observations, patient history, and epidemiological information. Fact Sheet for Patients:   StrictlyIdeas.no Fact Sheet for Healthcare Providers: BankingDealers.co.za This test is not yet approved or cleared  by the [place] FDA and has been authorized for detection and/or diagnosis of SARS-CoV-2 by FDA under an Emergency Use Authorization (EUA).  This EUA will remain in effect (meaning this test can be used) for the duration of the COVID-19 declaration under Section 564(b)(1)  of the Act, 21 U.S.C. section 360bbb-3(b)(1), unless the authorization is terminated or revoked sooner. Performed at [facility], [place] [address] [place], [place] [address]          Radiology Studies: No results found.      Scheduled Meds:  aspirin EC  81 mg Oral Daily   levothyroxine  125 mcg Oral QAC breakfast   montelukast  10 mg Oral Daily   potassium chloride SA  20 mEq Oral Daily   pravastatin  20 mg Oral QHS   Rivaroxaban  15 mg Oral Q supper   torsemide  40 mg Oral BID   Continuous Infusions:   LOS: 6 days    Time spent: 15 mins.More than 50% of that time was spent in counseling and/or coordination of care.      Shelly Coss, MD [facility] P6/07/2019, 10:48 AM

## 2019-08-18 NOTE — TOC Progression Note (Addendum)
Transition of Care [facility]) - Progression Note    Patient Details  Name: Katie [name] MRN: [id number] Date of Birth: Feb 21, 1941  Transition of Care [facility]) CM/SW [place], [place] Phone Number: 08/18/2019, 9:14 AM  Clinical Narrative:    Update: CSW spoke with [facility] and confirmed patient is able to discharge to facility today. Updated discharge summary faxed to SNF. CSW met with patient bedside to update her on the discharge. Patient provided permission to contact her daughter [name] for an update.   9:14a CSW contacted [place] to inquire on patient discharging to facility today, awaiting follow-up.    Expected Discharge Plan: [place] Barriers to Discharge: Continued Medical Work up  Expected Discharge Plan and Services Expected Discharge Plan: [place] In-house Referral: Clinical Social Work Discharge Planning Services: CM Consult   Living arrangements for the past 2 months: Single Family Home Expected Discharge Date: 08/18/19                                     Social Determinants of Health (SDOH) Interventions    Readmission Risk Interventions No flowsheet data found.

## 2019-08-18 NOTE — Progress Notes (Signed)
Called report to [facility]. Waiting for transport.

## 2019-08-18 NOTE — TOC Transition Note (Signed)
Transition of Care [facility]) - CM/SW Discharge Note   Patient Details  Name: Katie [name] MRN: [id number] Date of Birth: Apr 24, 1940  Transition of Care [facility]) CM/SW Contact:  Jacquelynn Cree Phone Number: 08/18/2019, 9:45 AM   Clinical Narrative:    Patient will DC to: [place] notified: [name], daughter Transport by: Corey Harold   Per MD patient ready for DC to [facility]. RN, patient, patient's family, and facility notified of DC. Discharge Summary and FL2 sent to facility. RN to call report prior to discharge ([place]). DC packet on chart. Ambulance transport requested for patient.   CSW will sign off for now as social work intervention is no longer needed. Please consult [place] again if new needs arise.    Final next level of care: Skilled Nursing Facility Barriers to Discharge: No Barriers Identified   Patient Goals and CMS Choice Patient states their goals for this hospitalization and ongoing recovery are:: Pt states they are agreeable to SNF placement, if it is short term. CMS Medicare.gov Compare Post Acute Care list provided to:: Patient Choice offered to / list presented to : Patient  Discharge Placement              Patient chooses bed at: [facility] Patient to be transferred to facility by: [place] Name of family member notified: [name] Patient and family notified of of transfer: 08/18/19  Discharge Plan and Services In-house Referral: Clinical Social Work Discharge Planning Services: CM Consult                                 Social Determinants of Health (SDOH) Interventions     Readmission Risk Interventions No flowsheet data found.

## 2019-08-20 ENCOUNTER — Telehealth: Payer: Self-pay | Admitting: *Deleted

## 2019-08-20 DIAGNOSIS — I482 Chronic atrial fibrillation, unspecified: Secondary | ICD-10-CM | POA: Diagnosis not present

## 2019-08-20 DIAGNOSIS — W19XXXD Unspecified fall, subsequent encounter: Secondary | ICD-10-CM | POA: Diagnosis not present

## 2019-08-20 DIAGNOSIS — R410 Disorientation, unspecified: Secondary | ICD-10-CM | POA: Diagnosis not present

## 2019-08-20 DIAGNOSIS — N179 Acute kidney failure, unspecified: Secondary | ICD-10-CM | POA: Diagnosis not present

## 2019-08-20 NOTE — Telephone Encounter (Signed)
Called patient on 08/20/2019 , 12:01 PM in an attempt to reach the patient for a hospital follow up. Spoke with the spouse, due to the patient being admitted to [facility] on 08/18/2019.   Admit date: 08/12/19 Discharge: 08/18/19   She does not have any questions or concerns about medications from the hospital admission. The patient's medications were reviewed over the phone, they were counseled to bring in all current medications to the hospital follow up visit.   I advised the patient to call if any questions or concerns arise about the hospital admission or medications    Home health was not started in the hospital. The patient was admitted to [facility] for rehab.All questions were answered and a follow up appointment was made. The spouse was advised to inform the patient to call our office for a follow up visit, when discharged to home. He agreed.   Prior to Admission medications   Medication Sig Start Date End Date Taking? Authorizing Provider  albuterol (PROAIR HFA) 108 (90 Base) MCG/ACT inhaler Use 2 Inhalations 15 minutes Apart every 4 hours to Rescue Asthma Attack Patient taking differently: Inhale 2 puffs into the lungs See admin instructions. Use 2 inhalations 15 minutes apart every 4 hours as needed for asthma attack 07/29/18   [name], MD  allopurinol (ZYLOPRIM) 300 MG tablet TAKE 1 TABLET BY MOUTH EVERY DAY Patient taking differently: Take 300 mg by mouth daily.  04/16/19   Vicie Mutters, PA-C  aspirin EC 81 MG tablet Take 81 mg by mouth daily.    [provider]  budesonide-formoterol (SYMBICORT) 160-4.5 MCG/ACT inhaler Inhale 2 puffs into the lungs 2 (two) times daily.    [provider]  cetirizine (ZYRTEC) 10 MG tablet Take 10 mg by mouth daily as needed for allergies.     [provider]  diphenhydrAMINE (BENADRYL) 25 MG tablet Take 25 mg by mouth every 6 (six) hours as needed for itching or allergies.    [provider]  Eyelid  Cleansers (AVENOVA) 0.01 % SOLN Apply 1 application topically See admin instructions. Wash eyelids twice daily with cleaner 11/28/17   [provider]  gabapentin (NEURONTIN) 100 MG capsule Take 1 capsule (100 mg total) by mouth 3 (three) times daily. 08/17/19 08/16/20  Shelly Coss, MD  Guaifenesin (MUCINEX MAXIMUM STRENGTH) 1200 MG TB12 Take 1,200 mg by mouth 2 (two) times daily as needed (cough/congestion).    [provider]  ipratropium (ATROVENT) 0.06 % nasal spray Use 1 to 2 sprays each Nostril 2 to 3 x / day as needed Patient taking differently: Place 1-2 sprays into both nostrils 3 (three) times daily as needed for rhinitis (congestion).  02/27/19 02/28/20  [name], MD  ipratropium-albuterol (DUONEB) 0.5-2.5 (3) MG/3ML SOLN Inhale 3 mLs into the lungs every 6 (six) hours as needed (for shortness of breath or wheezing). 02/27/19   [name], MD  L-Lysine 500 MG TABS Take 500 mg by mouth daily.    [provider]  leflunomide (ARAVA) 20 MG tablet Take 10 mg by mouth daily.  06/18/19   [provider]  levothyroxine (SYNTHROID) 125 MCG tablet Take 1 tablet (125 mcg total) by mouth daily before breakfast. 08/18/19   Shelly Coss, MD  Melatonin 10 MG TABS Take 10 mg by mouth daily as needed (sleep).    [provider]  montelukast (SINGULAIR) 10 MG tablet TAKE 1 TABLET DAILY FOR ALLERGIES & ASTHMA Patient taking differently: Take 10 mg by mouth daily.  for Allergies & Asthma 02/27/19   [name], MD  Olopatadine HCl 0.2 % SOLN Place 1 drop into both eyes daily as needed (dry eyes).  12/18/18   [provider]  OXYGEN Inhale 2 L into the lungs continuous.    [provider]  Polyethyl Glycol-Propyl Glycol (SYSTANE) 0.4-0.3 % SOLN Place 1 drop into both eyes 4 (four) times daily as needed (for dry eyes).     [provider]  potassium chloride SA (KLOR-CON) 20 MEQ tablet Take 20 mEq by mouth daily.      [provider]  pravastatin (PRAVACHOL) 40 MG tablet Take 20 mg by mouth at bedtime.    [provider]  Rivaroxaban (XARELTO) 15 MG TABS tablet Take 1 tablet Daily for Afib &  to Prevent Blood Clots Patient taking differently: Take 15 mg by mouth daily. for Afib &  to Prevent Blood Clots 06/24/19   [name], MD  torsemide (DEMADEX) 20 MG tablet Take 2 tablets (40 mg total) by mouth 2 (two) times daily. 08/28/18    Lance, MD  traMADol (ULTRAM) 50 MG tablet Take 1 tablet (50 mg total) by mouth every 6 (six) hours as needed for moderate pain. 08/18/19   Shelly Coss, MD  Zinc 50 MG TABS Take 50 mg by mouth daily.    [provider]

## 2019-08-23 ENCOUNTER — Telehealth: Payer: Self-pay | Admitting: *Deleted

## 2019-08-23 ENCOUNTER — Ambulatory Visit (INDEPENDENT_AMBULATORY_CARE_PROVIDER_SITE_OTHER): Payer: Medicare Other | Admitting: *Deleted

## 2019-08-23 ENCOUNTER — Telehealth: Payer: Self-pay | Admitting: Emergency Medicine

## 2019-08-23 DIAGNOSIS — I482 Chronic atrial fibrillation, unspecified: Secondary | ICD-10-CM

## 2019-08-23 LAB — CUP PACEART REMOTE DEVICE CHECK
Battery Remaining Longevity: 96 mo
Battery Remaining Percentage: 100 %
Brady Statistic RA Percent Paced: 0 %
Brady Statistic RV Percent Paced: 99 %
Date Time Interrogation Session: 20210609165200
HighPow Impedance: 76 Ohm
Implantable Lead Implant Date: 20190829
Implantable Lead Implant Date: 20190829
Implantable Lead Implant Date: 20190829
Implantable Lead Location: 753858
Implantable Lead Location: 753859
Implantable Lead Location: 753860
Implantable Lead Model: 292
Implantable Lead Model: 4674
Implantable Lead Model: 7740
Implantable Lead Serial Number: 446776
Implantable Lead Serial Number: 696999
Implantable Lead Serial Number: 827497
Implantable Pulse Generator Implant Date: 20190829
Lead Channel Impedance Value: 361 Ohm
Lead Channel Impedance Value: 547 Ohm
Lead Channel Impedance Value: 952 Ohm
Lead Channel Pacing Threshold Amplitude: 0.7 V
Lead Channel Pacing Threshold Pulse Width: 0.4 ms
Lead Channel Setting Pacing Amplitude: 2 V
Lead Channel Setting Pacing Amplitude: 2 V
Lead Channel Setting Pacing Pulse Width: 0.4 ms
Lead Channel Setting Pacing Pulse Width: 0.4 ms
Lead Channel Setting Sensing Sensitivity: 0.4 mV
Lead Channel Setting Sensing Sensitivity: 1 mV
Pulse Gen Serial Number: 215092

## 2019-08-23 NOTE — Telephone Encounter (Signed)
Patient called and asked the amount of water she should drink daily. Per Dr Melford Aase, she should drink about 60 ounces. A message was left to inform the patient.

## 2019-08-23 NOTE — Telephone Encounter (Signed)
Patient called office to let us know she is now at home. Reports she has been tired since discharge from [facility]. Has follow up on Monday with Dr Melford Aase . Demadex 20 mg was restarted this morning  Made aware that her transmission showed increase in fluid. Reports increased edema in BLE. ED precautions given for increased SOB, CP, dizziness, or syncope.

## 2019-08-23 NOTE — Telephone Encounter (Signed)
LMOM for spouse to call office and device clinic # provided.. Patient was discharged from [facility] on 08/20/19 to [facility] and rehab. No answer at facility. Heart logic score elevated to 35. Hospital stay was for CHF.

## 2019-08-23 NOTE — Telephone Encounter (Signed)
Patient called regarding medications discontinued during her hospital stay. Per Dr Melford Aase, patient can restart the Demadex 20 mg 1 tablet in the mornings and OK to restart Gabapentin, as she was taking prior to her hospitalization. Dr Melford Aase advised do not start the Digoxin until after her office visit on 08/27/2019, with Dr Melford Aase. Patient is aware.

## 2019-08-24 NOTE — Progress Notes (Signed)
Remote ICD transmission.   

## 2019-08-25 IMAGING — DX DG CHEST 2V
2 series · 2 of 2 positions shown · non-contrast
Comparison: 10/30/2017

CLINICAL DATA: Status post defibrillator placement

EXAM:
CHEST - 2 VIEW

[chest pa]
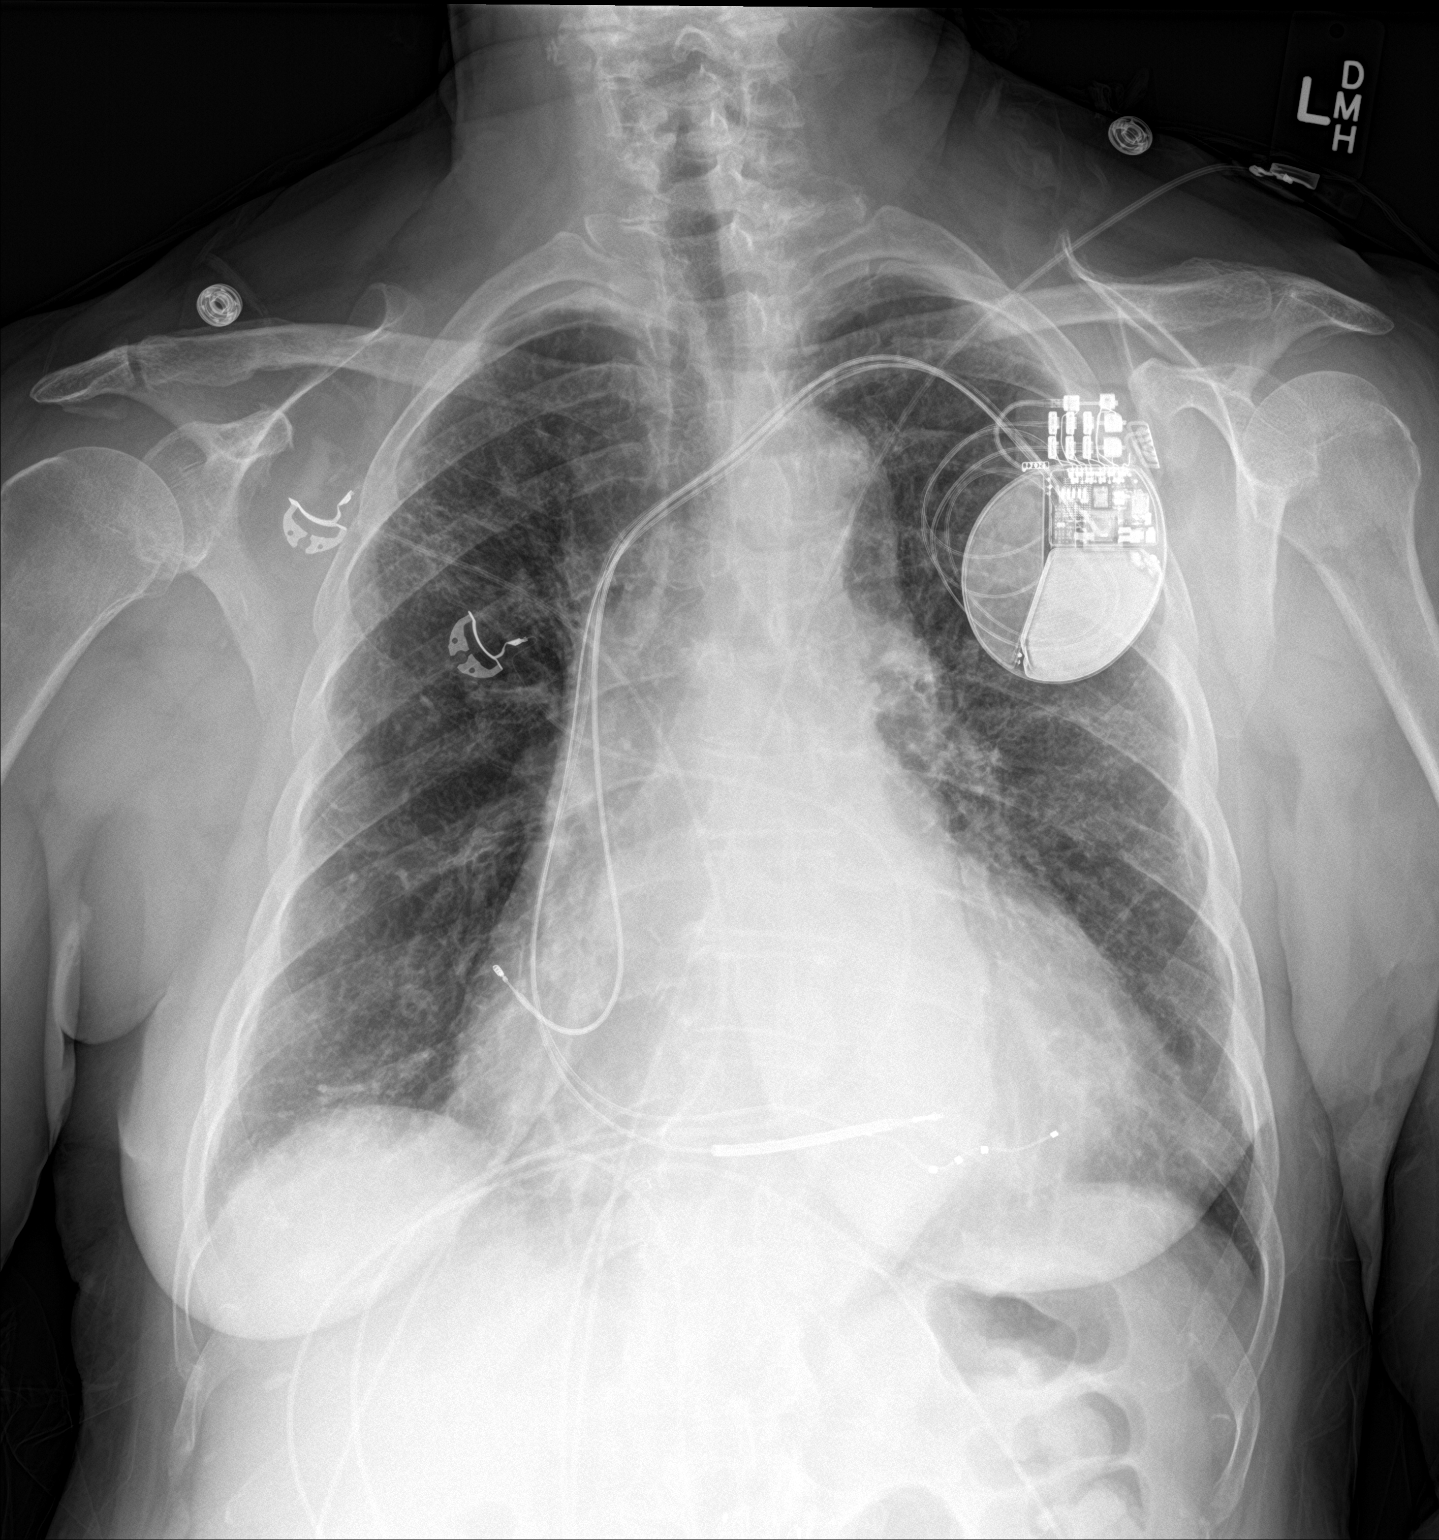

[chest lat]
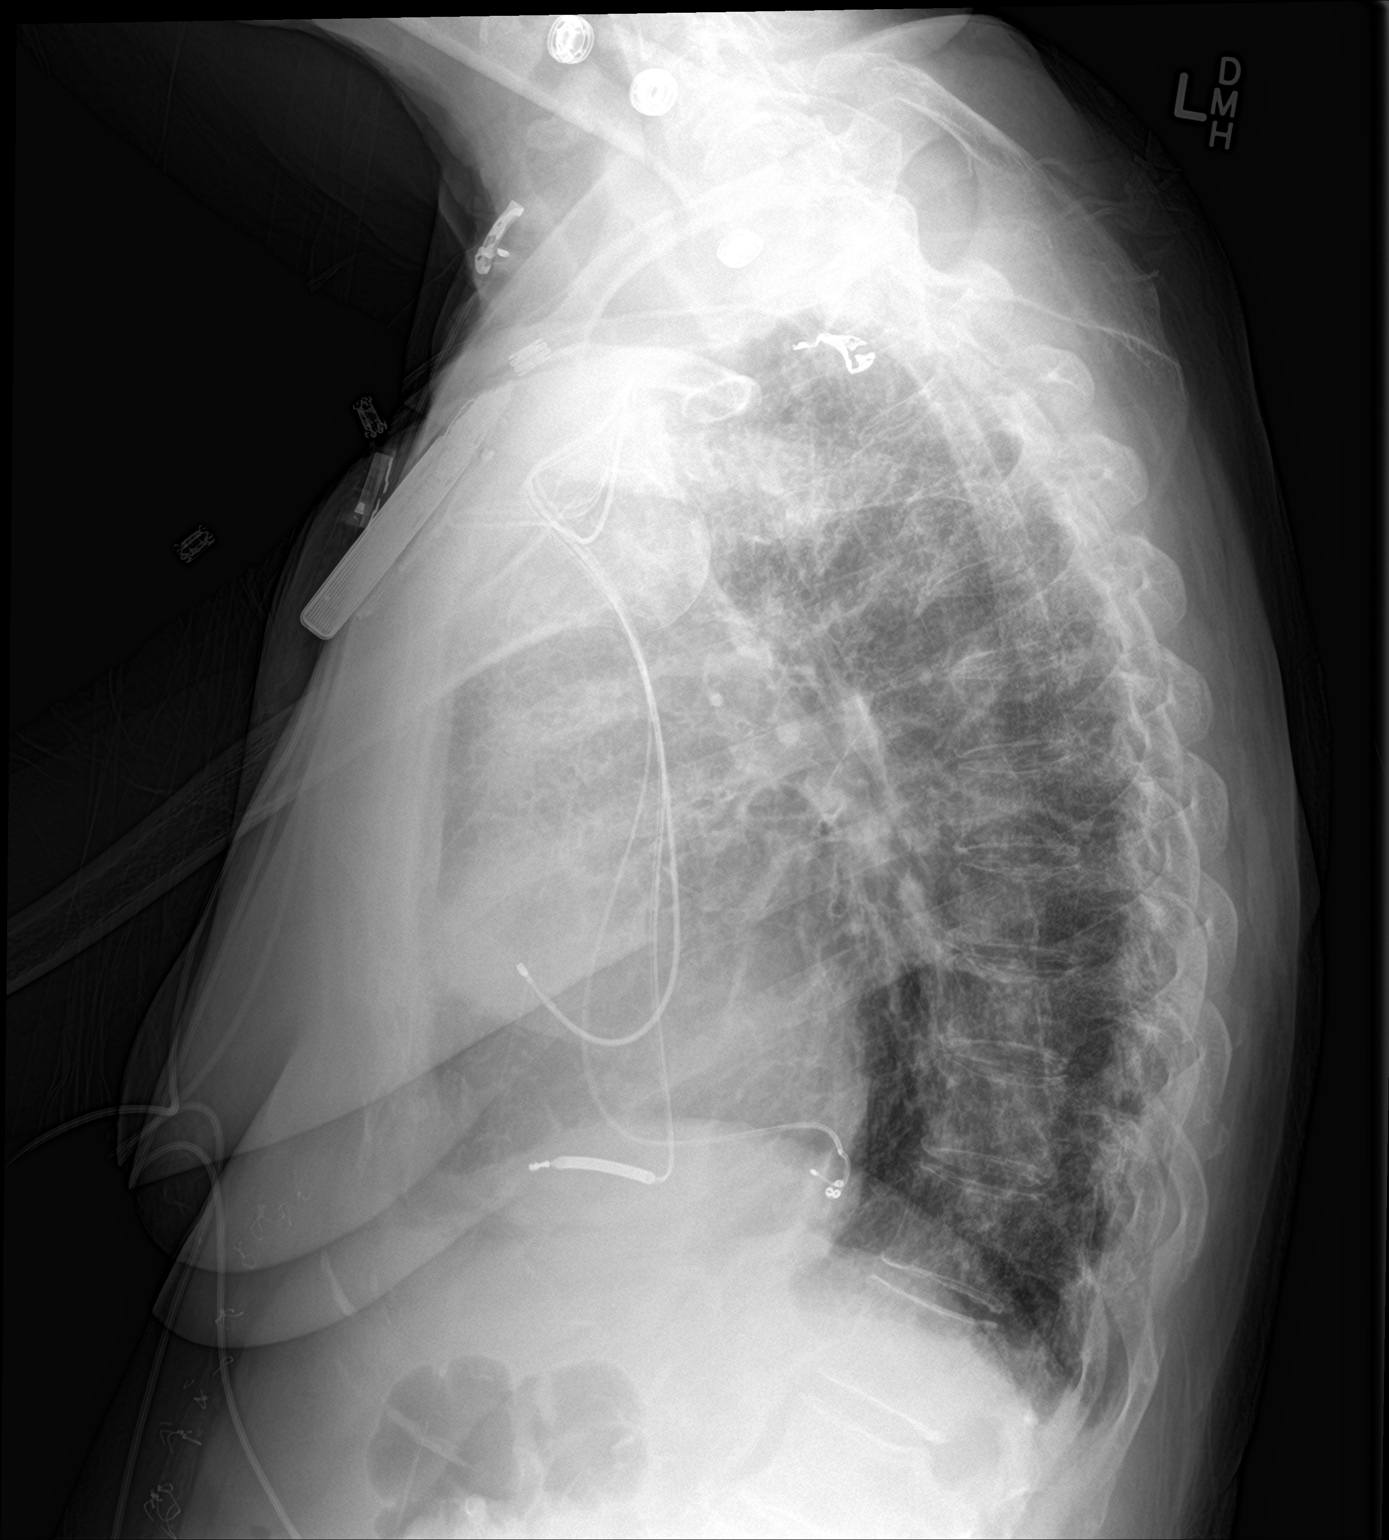

[2 of 2 positions shown; findings below may reference images not displayed]

FINDINGS: Cardiac shadow remains enlarged. Defibrillator is now seen. No
pneumothorax is noted. Mild chronic interstitial changes are again
seen and stable. No focal effusion is noted. No bony abnormality is
seen.
IMPRESSION: No pneumothorax following defibrillator placement.

Stable interstitial changes bilaterally.

## 2019-08-26 NOTE — Patient Instructions (Signed)

## 2019-08-26 NOTE — Progress Notes (Signed)
[place]     This very nice 79 y.o. MWF was admitted to the hospital on 08/12/2019  and patient was discharged from the hospital to SNF on  08/18/2019. The patient now presents for follow up for transition from recent hospitalization or Katie [name]  / SNF stay.  The day after discharge  our clinical staff contacted the patient to assure stability and schedule a follow up appointment. The discharge summary, medications and diagnostic test results were reviewed before meeting with the patient. Since return home from [facility] June 9, patient has been alert and denies issues with confusion (but her daughter here today reports there is still ongoing issues with confusion and her mother is very resistant to her medication supervision by the daughter). The patient still  endorses difficulty with balance & gait.    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) Chronic atrial fibrillation (HCC) Hyponatremia (Na=126)  Chronic respiratory failure with hypoxia (HCC) Leukocytosis Hyperbilirubinemia Rheumatoid arthritis of multiple sites  [facility])     Patient is a very nice 79 yo MWF with hx/o  HTN, HLD, ASHD/cAfib, chCHF with an BiVent AICD, CKD3, Prediabetes, Hypothyroidism, Gout , Rheumatoid Arthritis  and Vitamin D Deficiency who was admitted with confusion and recent falls and found to have electrolyte abnormalities, Digitalis toxicity and acute kidney injury.Work-up during hospitalization was negative and patient improved with IV fluid rehydration. Her diuretic therapy was tapered from #4 to #1 Demadex/day. She received LPT / OT during hospitalization. Atdischarge , she was transferred to a SNF for ongoing LPT, but left after 2 days. Patient also has wounds from her fall as deep abrasions over the Rt knee & both ankles& feet that she reports are painful and she is treating with antibiotic  ointmrnts.     Hospitalization discharge instructions and medications are reconciled with the patient.      Patient is also followed with Hypertension, Hyperlipidemia, Pre-Diabetes and Vitamin D Deficiency.      Patient is treated for HTN & BP has been controlled at home. Today's BP is elevated at 156/78. Patient has Chronic Biventricular Heart Failure with a sequenced AV AICD. Patient has had no complaints of any cardiac type chest pain, palpitations, dyspnea/orthopnea/PND, dizziness, claudication, or dependent edema.     Hyperlipidemia is controlled with diet & meds. Patient denies myalgias or other med SE's. Last Lipids were  Lab Results  Component Value Date   CHOL 145 05/30/2019   HDL 39 (L) 05/30/2019   LDLCALC 85 05/30/2019   TRIG 112 05/30/2019   CHOLHDL 3.7 05/30/2019      Also, the patient has history of PreDiabetes and has had no symptoms of reactive hypoglycemia, diabetic polys, paresthesias or visual blurring.  Last A1c was near goal:  Lab Results  Component Value Date   HGBA1C 5.7 (H) 02/27/2019      Further, the patient also has history of Vitamin D Deficiency and supplements vitamin D without any suspected side-effects. Last vitamin D was at goal: Lab Results  Component Value Date   VD25OH 98 02/27/2019   Current Outpatient Medications on File Prior to Visit  Medication Sig  . albuterol (PROAIR HFA) 108 (90 Base) MCG/ACT inhaler Use 2 Inhalations 15 minutes Apart every 4 hours to Rescue Asthma Attack (Patient taking differently: Inhale 2 puffs into the lungs See admin instructions. Use 2 inhalations 15 minutes apart every 4 hours as needed for asthma  attack)  . allopurinol (ZYLOPRIM) 300 MG tablet TAKE 1 TABLET BY MOUTH EVERY DAY (Patient taking differently: Take 300 mg by mouth daily. )  . aspirin EC 81 MG tablet Take 81 mg by mouth daily.  . budesonide-formoterol (SYMBICORT) 160-4.5 MCG/ACT inhaler Inhale 2 puffs into the lungs 2 (two) times daily.  . cetirizine  (ZYRTEC) 10 MG tablet Take 10 mg by mouth daily as needed for allergies.   . Eyelid Cleansers (AVENOVA) 0.01 % SOLN Apply 1 application topically See admin instructions. Wash eyelids twice daily with cleaner  . gabapentin (NEURONTIN) 100 MG capsule Take 1 capsule (100 mg total) by mouth 3 (three) times daily.  . Guaifenesin (MUCINEX MAXIMUM STRENGTH) 1200 MG TB12 Take 1,200 mg by mouth 2 (two) times daily as needed (cough/congestion).  Marland Kitchen ipratropium (ATROVENT) 0.06 % nasal spray Use 1 to 2 sprays each Nostril 2 to 3 x / day as needed (Patient taking differently: Place 1-2 sprays into both nostrils 3 (three) times daily as needed for rhinitis (congestion). )  . ipratropium-albuterol (DUONEB) 0.5-2.5 (3) MG/3ML SOLN Inhale 3 mLs into the lungs every 6 (six) hours as needed (for shortness of breath or wheezing).  Marland Kitchen L-Lysine 500 MG TABS Take 500 mg by mouth daily.  Marland Kitchen leflunomide (ARAVA) 20 MG tablet Take 10 mg by mouth daily.   Marland Kitchen levothyroxine (SYNTHROID) 125 MCG tablet Take 1 tablet (125 mcg total) by mouth daily before breakfast.  . montelukast (SINGULAIR) 10 MG tablet TAKE 1 TABLET DAILY FOR ALLERGIES & ASTHMA (Patient taking differently: Take 10 mg by mouth daily. for Allergies & Asthma)  . Olopatadine HCl 0.2 % SOLN Place 1 drop into both eyes daily as needed (dry eyes).   . OXYGEN Inhale 2 L into the lungs continuous.  [name] Glycol-Propyl Glycol (SYSTANE) 0.4-0.3 % SOLN Place 1 drop into both eyes 4 (four) times daily as needed (for dry eyes).   . potassium chloride SA (KLOR-CON) 20 MEQ tablet Take 20 mEq by mouth daily.   . pravastatin (PRAVACHOL) 40 MG tablet Take 20 mg by mouth at bedtime.  . Rivaroxaban (XARELTO) 15 MG TABS tablet Take 1 tablet Daily for Afib &  to Prevent Blood Clots (Patient taking differently: Take 15 mg by mouth daily. for Afib &  to Prevent Blood Clots)  . torsemide (DEMADEX) 20 MG tablet Take 2 tablets (40 mg total) by mouth 2 (two) times daily.   No current  facility-administered medications on file prior to visit.   Allergies  Allergen Reactions  . Amiodarone Other (See Comments)    PULMONARY TOXICITY  . Diovan [Valsartan] Other (See Comments)    HYPOTENSION  . Doxycycline Diarrhea and Other (See Comments)    VISUAL DISTURBANCE  . Flexeril [Cyclobenzaprine] Other (See Comments)    FATIGUE  . Keflex [Cephalexin] Diarrhea  . Verapamil Other (See Comments)    EDEMA  . Codeine Hives   PMHx:   Past Medical History:  Diagnosis Date  . AAA (abdominal aortic aneurysm) ([place])   . AICD (automatic cardioverter/defibrillator) present 11/10/2017  . Arthritis    "some in my knees" (11/10/2017)  . Atrial fibrillation ([employer])   . CHF (congestive heart failure) ([place])   . Chronic bronchitis ([place])   . COPD (chronic obstructive pulmonary disease) ([place])   . Depression   . GERD (gastroesophageal reflux disease)   . Gout    "daily RX" (11/10/2017)  . History of hiatal hernia   . Hyperlipidemia   . Hypertension   .  Hypothyroid   . Migraine headache    "hx; none since 1980s" (11/10/2017)  . Osteopenia   . Pneumonia    "~ 3 times" (11/10/2017)  . PVD (peripheral vascular disease) ([place])    Immunization History  Administered Date(s) Administered  . Influenza, High Dose Seasonal PF 12/20/2013, 12/09/2014, 11/14/2015, 01/10/2017, 12/02/2017, 11/23/2018  . Influenza-Unspecified 01/02/2013  . Moderna SARS-COVID-2 Vaccination 03/28/2019, 04/24/2019  . PPD Test 07/17/2016  . Pneumococcal Conjugate-13 01/23/2014  . Pneumococcal-Unspecified 03/15/1993, 05/31/2008  . Td 03/16/2007  . Tdap 08/11/2017  . Varicella 02/19/2008   Past Surgical History:  Procedure Laterality Date  . ABDOMINAL AORTIC ENDOVASCULAR STENT GRAFT N/A 04/11/2013   Procedure: ABDOMINAL AORTIC ENDOVASCULAR STENT GRAFT WITH RIGHT FEMORAL PATCH ANGIOPLASTY;  Surgeon: Mal Misty, MD;  Location: [place];  Service: Vascular;  Laterality: N/A;  . AV NODE ABLATION N/A 09/13/2018   Procedure:  AV NODE ABLATION;  Surgeon: Evans Lance, MD;  Location: [place] CV LAB;  Service: Cardiovascular;  Laterality: N/A;  . BIV ICD INSERTION CRT-D  11/10/2017  . BIV ICD INSERTION CRT-D N/A 11/10/2017   Procedure: BIV ICD INSERTION CRT-D;  Surgeon: Evans Lance, MD;  Location: [place] CV LAB;  Service: Cardiovascular;  Laterality: N/A;  . BREAST SURGERY     LEFT BREAST BIOPSY  . CARDIAC CATHETERIZATION    . CATARACT EXTRACTION W/ INTRAOCULAR LENS  IMPLANT, BILATERAL Bilateral   . CHOLECYSTECTOMY OPEN  1972  . COLONOSCOPY    . EYE SURGERY Bilateral    "bleeding in my eyes"  . FRACTURE SURGERY    . TIBIA FRACTURE SURGERY Left 1970s  . TONSILLECTOMY    . VARICOSE VEIN SURGERY Bilateral    "laser"   FHx:    Reviewed / unchanged  SHx:    Reviewed / unchanged  Systems Review:  Constitutional: Denies fever, chills, wt changes, headaches, insomnia, fatigue, night sweats, change in appetite. Eyes: Denies redness, blurred vision, diplopia, discharge, itchy, watery eyes.  ENT: Denies discharge, congestion, post nasal drip, epistaxis, sore throat, earache, hearing loss, dental pain, tinnitus, vertigo, sinus pain, snoring.  CV: Denies chest pain, palpitations, irregular heartbeat, syncope, dyspnea, diaphoresis, orthopnea, PND, claudication or edema. Respiratory: denies cough, dyspnea, DOE, pleurisy, hoarseness, laryngitis, wheezing.  Gastrointestinal: Denies dysphagia, odynophagia, heartburn, reflux, water brash, abdominal pain or cramps, nausea, vomiting, bloating, diarrhea, constipation, hematemesis, melena, hematochezia  or hemorrhoids. Genitourinary: Denies dysuria, frequency, urgency, nocturia, hesitancy, discharge, hematuria or flank pain. Musculoskeletal: Denies arthralgias, myalgias, stiffness, jt. swelling,  limping or strain/sprain.  Skin: Denies pruritus, rash, hives, warts, acne, eczema or change in skin lesion(s). Neuro: No weakness, tremor, incoordination, spasms or  paresthesia . Psychiatric: denies memory loss or sensory loss. Endo: Denies change in weight, skin or hair change.  Heme/Lymph: No excessive bleeding, bruising or enlarged lymph nodes.  Physical Exam  BP (!) 156/78   Pulse 78   Temp 97.6 F (36.4 C)   Resp 18   Ht 5' 3.5" (1.613 m)   Wt 180 lb 1.6 oz (81.7 kg)   SpO2 94%   BMI 31.40 kg/m   Appears well groomed  and in no distress.  Eyes: PERRLA, EOMs, conjunctiva no swelling or erythema. Sinuses: No frontal/maxillary tenderness ENT/Mouth: EAC's clear, TM's nl w/o erythema, bulging. Nares clear w/o erythema, swelling, exudates. Oropharynx clear without erythema or exudates. Oral hygiene is good. Tongue normal, non obstructing. Hearing intact.  Neck: Supple. Thyroid nl. Car 2+/2+ without bruits, nodes or JVD. Chest: Respirations nl with BS  decreased & equal w/o rales, rhonchi, wheezing or stridor.  Cor: Heart sounds soft w/ irregular rate and rhythm without sig. murmurs. Peripheral pulses obscured by edema with dependent rubor and coolness of distal feet & toes. Also, superficial breakdown ulcerations of  the Rt knee, Rt ankle and Lt foot  Abdomen: Soft & bowel sounds normal. Non-tender w/o guarding, rebound, hernias, masses or organomegaly.  Lymphatics: Unremarkable.  Musculoskeletal: Full ROM all peripheral extremities with sl broad-based gait supported by a cane.  Skin: Warm, dry without exposed rashesor ecchymosis apparent.  Neuro: Cranial nerves intact, reflexes equal bilaterally. Sensory-motor testing grossly intact. Tendon reflexes grossly intact.  Pysch: Alert & oriented x 3.  Insight and judgement equivocal. No ideations.  Assessment and Plan:  1. Acute encephalopathy  - CBC with Differential/Platelet - COMPLETE METABOLIC PANEL WITH GFR - Magnesium  2. Acute kidney injury ([place])  - COMPLETE METABOLIC PANEL WITH GFR  3. Chronic combined systolic and diastolic CHF (congestive heart failure) (HCC)  - COMPLETE  METABOLIC PANEL WITH GFR - Magnesium  4. Chronic atrial fibrillation (HCC)  - COMPLETE METABOLIC PANEL WITH GFR - Magnesium  5. Hyponatremia  - COMPLETE METABOLIC PANEL WITH GFR - Magnesium  6. Chronic respiratory failure with hypoxia (HCC)  - COMPLETE METABOLIC PANEL WITH GFR  7. Leukocytosis, unspecified type  - CBC with Differential/Platelet  8. Hyperbilirubinemia, resolved  - COMPLETE METABOLIC PANEL WITH GFR  9. Rheumatoid arthritis of multiple sites ([place])  10. Hypothyroidism  - TSH  11. Abnormal glucose  - Hemoglobin A1c - Insulin, random  12. Vitamin D deficiency  - VITAMIN D 25 Hydroxy  13. Pressure injury of lower extremity, stage 1  - Recommend Home Health for Decubiti care   14. Unstable gait  -Recommend Home LPT for gait & balance therapy  15. At high risk for falls  -Recommend Home LPT for gait & balance therapy         Discussed  regular exercise, BP monitoring, monitoring daily weights to monitor fluid balance status and discussed meds and SE's. Defer dose change of  Demadex pending today's labs.  Recommended labs to assess and monitor clinical status with further disposition pending results of labs.  ROV - 1 month for reassessment. Over 50 minutes of exam, counseling, chart review was performed.

## 2019-08-27 ENCOUNTER — Other Ambulatory Visit: Payer: Self-pay

## 2019-08-27 ENCOUNTER — Encounter: Payer: Self-pay | Admitting: Internal Medicine

## 2019-08-27 ENCOUNTER — Ambulatory Visit (INDEPENDENT_AMBULATORY_CARE_PROVIDER_SITE_OTHER): Payer: Medicare Other | Admitting: Internal Medicine

## 2019-08-27 VITALS — BP 156/78 | HR 78 | Temp 97.6°F | Resp 18 | Ht 63.5 in | Wt 180.1 lb

## 2019-08-27 DIAGNOSIS — E039 Hypothyroidism, unspecified: Secondary | ICD-10-CM | POA: Diagnosis not present

## 2019-08-27 DIAGNOSIS — R2681 Unsteadiness on feet: Secondary | ICD-10-CM

## 2019-08-27 DIAGNOSIS — I482 Chronic atrial fibrillation, unspecified: Secondary | ICD-10-CM | POA: Diagnosis not present

## 2019-08-27 DIAGNOSIS — G9340 Encephalopathy, unspecified: Secondary | ICD-10-CM | POA: Diagnosis not present

## 2019-08-27 DIAGNOSIS — L89891 Pressure ulcer of other site, stage 1: Secondary | ICD-10-CM

## 2019-08-27 DIAGNOSIS — E871 Hypo-osmolality and hyponatremia: Secondary | ICD-10-CM

## 2019-08-27 DIAGNOSIS — E559 Vitamin D deficiency, unspecified: Secondary | ICD-10-CM

## 2019-08-27 DIAGNOSIS — R7309 Other abnormal glucose: Secondary | ICD-10-CM | POA: Diagnosis not present

## 2019-08-27 DIAGNOSIS — N179 Acute kidney failure, unspecified: Secondary | ICD-10-CM

## 2019-08-27 DIAGNOSIS — D72829 Elevated white blood cell count, unspecified: Secondary | ICD-10-CM

## 2019-08-27 DIAGNOSIS — I5042 Chronic combined systolic (congestive) and diastolic (congestive) heart failure: Secondary | ICD-10-CM

## 2019-08-27 DIAGNOSIS — M0609 Rheumatoid arthritis without rheumatoid factor, multiple sites: Secondary | ICD-10-CM

## 2019-08-27 DIAGNOSIS — Z9181 History of falling: Secondary | ICD-10-CM

## 2019-08-27 DIAGNOSIS — J9611 Chronic respiratory failure with hypoxia: Secondary | ICD-10-CM

## 2019-08-28 ENCOUNTER — Other Ambulatory Visit: Payer: Self-pay | Admitting: Internal Medicine

## 2019-08-28 DIAGNOSIS — Z9981 Dependence on supplemental oxygen: Secondary | ICD-10-CM | POA: Diagnosis not present

## 2019-08-28 DIAGNOSIS — E871 Hypo-osmolality and hyponatremia: Secondary | ICD-10-CM | POA: Diagnosis not present

## 2019-08-28 DIAGNOSIS — W19XXXD Unspecified fall, subsequent encounter: Secondary | ICD-10-CM | POA: Diagnosis not present

## 2019-08-28 DIAGNOSIS — G9340 Encephalopathy, unspecified: Secondary | ICD-10-CM | POA: Diagnosis not present

## 2019-08-28 DIAGNOSIS — G629 Polyneuropathy, unspecified: Secondary | ICD-10-CM | POA: Diagnosis not present

## 2019-08-28 DIAGNOSIS — I5082 Biventricular heart failure: Secondary | ICD-10-CM | POA: Diagnosis not present

## 2019-08-28 DIAGNOSIS — N1832 Chronic kidney disease, stage 3b: Secondary | ICD-10-CM | POA: Diagnosis not present

## 2019-08-28 DIAGNOSIS — J449 Chronic obstructive pulmonary disease, unspecified: Secondary | ICD-10-CM | POA: Diagnosis not present

## 2019-08-28 DIAGNOSIS — I2510 Atherosclerotic heart disease of native coronary artery without angina pectoris: Secondary | ICD-10-CM | POA: Diagnosis not present

## 2019-08-28 DIAGNOSIS — R7303 Prediabetes: Secondary | ICD-10-CM | POA: Diagnosis not present

## 2019-08-28 DIAGNOSIS — N179 Acute kidney failure, unspecified: Secondary | ICD-10-CM | POA: Diagnosis not present

## 2019-08-28 DIAGNOSIS — I130 Hypertensive heart and chronic kidney disease with heart failure and stage 1 through stage 4 chronic kidney disease, or unspecified chronic kidney disease: Secondary | ICD-10-CM | POA: Diagnosis not present

## 2019-08-28 DIAGNOSIS — I482 Chronic atrial fibrillation, unspecified: Secondary | ICD-10-CM | POA: Diagnosis not present

## 2019-08-28 DIAGNOSIS — M170 Bilateral primary osteoarthritis of knee: Secondary | ICD-10-CM | POA: Diagnosis not present

## 2019-08-28 DIAGNOSIS — I5042 Chronic combined systolic (congestive) and diastolic (congestive) heart failure: Secondary | ICD-10-CM | POA: Diagnosis not present

## 2019-08-28 DIAGNOSIS — S91001D Unspecified open wound, right ankle, subsequent encounter: Secondary | ICD-10-CM | POA: Diagnosis not present

## 2019-08-28 DIAGNOSIS — I429 Cardiomyopathy, unspecified: Secondary | ICD-10-CM | POA: Diagnosis not present

## 2019-08-28 DIAGNOSIS — I739 Peripheral vascular disease, unspecified: Secondary | ICD-10-CM | POA: Diagnosis not present

## 2019-08-28 DIAGNOSIS — Z9181 History of falling: Secondary | ICD-10-CM | POA: Diagnosis not present

## 2019-08-28 DIAGNOSIS — E559 Vitamin D deficiency, unspecified: Secondary | ICD-10-CM | POA: Diagnosis not present

## 2019-08-28 DIAGNOSIS — E039 Hypothyroidism, unspecified: Secondary | ICD-10-CM | POA: Diagnosis not present

## 2019-08-28 DIAGNOSIS — M0609 Rheumatoid arthritis without rheumatoid factor, multiple sites: Secondary | ICD-10-CM | POA: Diagnosis not present

## 2019-08-28 DIAGNOSIS — E785 Hyperlipidemia, unspecified: Secondary | ICD-10-CM | POA: Diagnosis not present

## 2019-08-28 DIAGNOSIS — J9611 Chronic respiratory failure with hypoxia: Secondary | ICD-10-CM | POA: Diagnosis not present

## 2019-08-28 DIAGNOSIS — S91302D Unspecified open wound, left foot, subsequent encounter: Secondary | ICD-10-CM | POA: Diagnosis not present

## 2019-08-28 LAB — CBC WITH DIFFERENTIAL/PLATELET
Absolute Monocytes: 555 cells/uL (ref 200–950)
Basophils Absolute: 122 cells/uL (ref 0–200)
Basophils Relative: 2 %
Eosinophils Absolute: 299 cells/uL (ref 15–500)
Eosinophils Relative: 4.9 %
HCT: 34.4 % — ABNORMAL LOW (ref 35.0–45.0)
Hemoglobin: 11 g/dL — ABNORMAL LOW (ref 11.7–15.5)
Lymphs Abs: 1336 cells/uL (ref 850–3900)
MCH: 28.2 pg (ref 27.0–33.0)
MCHC: 32 g/dL (ref 32.0–36.0)
MCV: 88.2 fL (ref 80.0–100.0)
MPV: 10.6 fL (ref 7.5–12.5)
Monocytes Relative: 9.1 %
Neutro Abs: 3788 cells/uL (ref 1500–7800)
Neutrophils Relative %: 62.1 %
Platelets: 374 10*3/uL (ref 140–400)
RBC: 3.9 10*6/uL (ref 3.80–5.10)
RDW: 15.6 % — ABNORMAL HIGH (ref 11.0–15.0)
Total Lymphocyte: 21.9 %
WBC: 6.1 10*3/uL (ref 3.8–10.8)

## 2019-08-28 LAB — COMPLETE METABOLIC PANEL WITH GFR
AG Ratio: 1.7 (calc) (ref 1.0–2.5)
ALT: 16 U/L (ref 6–29)
AST: 15 U/L (ref 10–35)
Albumin: 3.5 g/dL — ABNORMAL LOW (ref 3.6–5.1)
Alkaline phosphatase (APISO): 63 U/L (ref 37–153)
BUN/Creatinine Ratio: 17 (calc) (ref 6–22)
BUN: 21 mg/dL (ref 7–25)
CO2: 28 mmol/L (ref 20–32)
Calcium: 9.8 mg/dL (ref 8.6–10.4)
Chloride: 105 mmol/L (ref 98–110)
Creat: 1.23 mg/dL — ABNORMAL HIGH (ref 0.60–0.93)
GFR, Est African American: 49 mL/min/{1.73_m2} — ABNORMAL LOW (ref >=60)
GFR, Est Non African American: 42 mL/min/{1.73_m2} — ABNORMAL LOW (ref >=60)
Globulin: 2.1 g/dL (calc) (ref 1.9–3.7)
Glucose, Bld: 85 mg/dL (ref 65–99)
Potassium: 4.5 mmol/L (ref 3.5–5.3)
Sodium: 140 mmol/L (ref 135–146)
Total Bilirubin: 1 mg/dL (ref 0.2–1.2)
Total Protein: 5.6 g/dL — ABNORMAL LOW (ref 6.1–8.1)

## 2019-08-28 LAB — HEMOGLOBIN A1C
Hgb A1c MFr Bld: 5.2 % of total Hgb (ref <5.7)
Mean Plasma Glucose: 103 (calc)
eAG (mmol/L): 5.7 (calc)

## 2019-08-28 LAB — MAGNESIUM: Magnesium: 2 mg/dL (ref 1.5–2.5)

## 2019-08-28 LAB — VITAMIN D 25 HYDROXY (VIT D DEFICIENCY, FRACTURES): Vit D, 25-Hydroxy: 93 ng/mL (ref 30–100)

## 2019-08-28 LAB — TSH: TSH: 3.72 mIU/L (ref 0.40–4.50)

## 2019-08-28 LAB — INSULIN, RANDOM: Insulin: 9.7 u[IU]/mL

## 2019-08-28 MED ORDER — FLUCONAZOLE 150 MG PO TABS: ORAL_TABLET | ORAL | 0 refills | Status: DC

## 2019-08-28 MED ORDER — BUDESONIDE-FORMOTEROL FUMARATE 80-4.5 MCG/ACT IN AERO: INHALATION_SPRAY | RESPIRATORY_TRACT | 3 refills | Status: AC

## 2019-08-29 ENCOUNTER — Other Ambulatory Visit: Payer: Self-pay | Admitting: *Deleted

## 2019-08-29 ENCOUNTER — Ambulatory Visit (INDEPENDENT_AMBULATORY_CARE_PROVIDER_SITE_OTHER): Payer: Medicare Other

## 2019-08-29 ENCOUNTER — Other Ambulatory Visit: Payer: Self-pay

## 2019-08-29 ENCOUNTER — Other Ambulatory Visit: Payer: Self-pay | Admitting: Internal Medicine

## 2019-08-29 DIAGNOSIS — E559 Vitamin D deficiency, unspecified: Secondary | ICD-10-CM | POA: Diagnosis not present

## 2019-08-29 DIAGNOSIS — R300 Dysuria: Secondary | ICD-10-CM | POA: Diagnosis not present

## 2019-08-29 DIAGNOSIS — M170 Bilateral primary osteoarthritis of knee: Secondary | ICD-10-CM | POA: Diagnosis not present

## 2019-08-29 DIAGNOSIS — G629 Polyneuropathy, unspecified: Secondary | ICD-10-CM | POA: Diagnosis not present

## 2019-08-29 DIAGNOSIS — I5082 Biventricular heart failure: Secondary | ICD-10-CM | POA: Diagnosis not present

## 2019-08-29 DIAGNOSIS — E871 Hypo-osmolality and hyponatremia: Secondary | ICD-10-CM | POA: Diagnosis not present

## 2019-08-29 DIAGNOSIS — N1832 Chronic kidney disease, stage 3b: Secondary | ICD-10-CM | POA: Diagnosis not present

## 2019-08-29 DIAGNOSIS — W19XXXD Unspecified fall, subsequent encounter: Secondary | ICD-10-CM | POA: Diagnosis not present

## 2019-08-29 DIAGNOSIS — E785 Hyperlipidemia, unspecified: Secondary | ICD-10-CM | POA: Diagnosis not present

## 2019-08-29 DIAGNOSIS — S91302D Unspecified open wound, left foot, subsequent encounter: Secondary | ICD-10-CM | POA: Diagnosis not present

## 2019-08-29 DIAGNOSIS — Z9181 History of falling: Secondary | ICD-10-CM | POA: Diagnosis not present

## 2019-08-29 DIAGNOSIS — R7303 Prediabetes: Secondary | ICD-10-CM | POA: Diagnosis not present

## 2019-08-29 DIAGNOSIS — I5042 Chronic combined systolic (congestive) and diastolic (congestive) heart failure: Secondary | ICD-10-CM | POA: Diagnosis not present

## 2019-08-29 DIAGNOSIS — E039 Hypothyroidism, unspecified: Secondary | ICD-10-CM | POA: Diagnosis not present

## 2019-08-29 DIAGNOSIS — N179 Acute kidney failure, unspecified: Secondary | ICD-10-CM | POA: Diagnosis not present

## 2019-08-29 DIAGNOSIS — I2510 Atherosclerotic heart disease of native coronary artery without angina pectoris: Secondary | ICD-10-CM | POA: Diagnosis not present

## 2019-08-29 DIAGNOSIS — G9340 Encephalopathy, unspecified: Secondary | ICD-10-CM | POA: Diagnosis not present

## 2019-08-29 DIAGNOSIS — Z9981 Dependence on supplemental oxygen: Secondary | ICD-10-CM | POA: Diagnosis not present

## 2019-08-29 DIAGNOSIS — J449 Chronic obstructive pulmonary disease, unspecified: Secondary | ICD-10-CM | POA: Diagnosis not present

## 2019-08-29 DIAGNOSIS — J9611 Chronic respiratory failure with hypoxia: Secondary | ICD-10-CM | POA: Diagnosis not present

## 2019-08-29 DIAGNOSIS — I429 Cardiomyopathy, unspecified: Secondary | ICD-10-CM | POA: Diagnosis not present

## 2019-08-29 DIAGNOSIS — I130 Hypertensive heart and chronic kidney disease with heart failure and stage 1 through stage 4 chronic kidney disease, or unspecified chronic kidney disease: Secondary | ICD-10-CM | POA: Diagnosis not present

## 2019-08-29 DIAGNOSIS — S91001D Unspecified open wound, right ankle, subsequent encounter: Secondary | ICD-10-CM | POA: Diagnosis not present

## 2019-08-29 DIAGNOSIS — M0609 Rheumatoid arthritis without rheumatoid factor, multiple sites: Secondary | ICD-10-CM | POA: Diagnosis not present

## 2019-08-29 DIAGNOSIS — I482 Chronic atrial fibrillation, unspecified: Secondary | ICD-10-CM | POA: Diagnosis not present

## 2019-08-29 DIAGNOSIS — I739 Peripheral vascular disease, unspecified: Secondary | ICD-10-CM | POA: Diagnosis not present

## 2019-08-29 NOTE — Patient Outreach (Signed)
[facility]) Care Management  08/29/2019  Katie [name] 14-Apr-1940 [id number]  Telephone outreach to follow up on pt post hospitalization and subsequent SNF stay. Pt had a fall and then was taken to the ED for evaluation. Dx with AKI, has hx CKD Stage III. Stayed one week.  She was discharged from from SNF last week. She is back home living independently and feeling much better. Her daughter checks on her often. She has seen her primary MD and will see the nephrologist next week.  She did have several medications that were stopped or decreased in dosage. Patient was recently discharged from hospital and all medications have been reviewed. Outpatient Encounter Medications as of 08/29/2019  Medication Sig Note  . albuterol (PROAIR HFA) 108 (90 Base) MCG/ACT inhaler Use 2 Inhalations 15 minutes Apart every 4 hours to Rescue Asthma Attack (Patient taking differently: Inhale 2 puffs into the lungs See admin instructions. Use 2 inhalations 15 minutes apart every 4 hours as needed for asthma attack)   . allopurinol (ZYLOPRIM) 300 MG tablet TAKE 1 TABLET BY MOUTH EVERY DAY (Patient taking differently: Take 300 mg by mouth daily. )   . aspirin EC 81 MG tablet Take 81 mg by mouth daily.   . budesonide-formoterol (SYMBICORT) 80-4.5 MCG/ACT inhaler Use 2 inhalations 30 minutes apart 2 x /day (every 12 hours)   . Eyelid Cleansers (AVENOVA) 0.01 % SOLN Apply 1 application topically See admin instructions. Wash eyelids twice daily with cleaner   . fluconazole (DIFLUCAN) 150 MG tablet Take 1 tablet 2 x /week  if needed for yeast infection   . Guaifenesin (MUCINEX MAXIMUM STRENGTH) 1200 MG TB12 Take 1,200 mg by mouth 2 (two) times daily as needed (cough/congestion).   Marland Kitchen ipratropium (ATROVENT) 0.06 % nasal spray Use 1 to 2 sprays each Nostril 2 to 3 x / day as needed (Patient taking differently: Place 1-2 sprays into both nostrils 3 (three) times daily as needed for rhinitis (congestion). )   .  ipratropium-albuterol (DUONEB) 0.5-2.5 (3) MG/3ML SOLN Inhale 3 mLs into the lungs every 6 (six) hours as needed (for shortness of breath or wheezing).   Marland Kitchen leflunomide (ARAVA) 20 MG tablet Take 10 mg by mouth daily.    Marland Kitchen levothyroxine (SYNTHROID) 125 MCG tablet Take 1 tablet (125 mcg total) by mouth daily before breakfast.   . montelukast (SINGULAIR) 10 MG tablet TAKE 1 TABLET DAILY FOR ALLERGIES & ASTHMA (Patient taking differently: Take 10 mg by mouth daily. for Allergies & Asthma)   . Olopatadine HCl 0.2 % SOLN Place 1 drop into both eyes daily as needed (dry eyes).    . OXYGEN Inhale 2 L into the lungs continuous.   [name] Glycol-Propyl Glycol (SYSTANE) 0.4-0.3 % SOLN Place 1 drop into both eyes 4 (four) times daily as needed (for dry eyes).    . potassium chloride SA (KLOR-CON) 20 MEQ tablet Take 20 mEq by mouth daily.    . pravastatin (PRAVACHOL) 40 MG tablet Take 20 mg by mouth at bedtime. 08/29/2019: Taking 1/2 tablet every other day per Dr. Melford Aase.  . Rivaroxaban (XARELTO) 15 MG TABS tablet Take 1 tablet Daily for Afib &  to Prevent Blood Clots (Patient taking differently: Take 15 mg by mouth daily. for Afib &  to Prevent Blood Clots) 08/12/2019: Daughter does not know date or time of last dose - candy in pill box - not meds  . torsemide (DEMADEX) 20 MG tablet Take 2 tablets (40 mg total) by mouth  2 (two) times daily. 08/29/2019: Reduced dose to only one tablet bid.  . cetirizine (ZYRTEC) 10 MG tablet Take 10 mg by mouth daily as needed for allergies.  (Patient not taking: Reported on 08/29/2019) 08/29/2019: Only prn  . gabapentin (NEURONTIN) 100 MG capsule Take 1 capsule (100 mg total) by mouth 3 (three) times daily. (Patient not taking: Reported on 08/29/2019) 08/29/2019: On hold at this time.  Marland Kitchen L-Lysine 500 MG TABS Take 500 mg by mouth daily. (Patient not taking: Reported on 08/29/2019) 08/29/2019: Only takes as needed.   No facility-administered encounter medications on file as of  08/29/2019.   New plan of care established: Pt to learn about CKD and be able to discuss what she can do to prevent further progression of the disease.   Pt will attend nephrology visit next week.  Had been following pt quarterly. Now will follow more frequently. Next call in 2 weeks. Pt knows she can call me with any problems or questions.  [name]. [name], MSN, [facility] Gerontological Nurse Practitioner [facility] Care Management [phone]

## 2019-08-29 NOTE — Progress Notes (Signed)
Patient presents to the office for a nurse visit to provide a urine specimen to check for a UTI. Symptoms include urinary frequency and burning for two days. Vitals taken and recorded.

## 2019-08-30 ENCOUNTER — Telehealth: Payer: Self-pay | Admitting: *Deleted

## 2019-08-30 ENCOUNTER — Other Ambulatory Visit: Payer: Self-pay | Admitting: Internal Medicine

## 2019-08-30 MED ORDER — PREGABALIN 25 MG PO CAPS: ORAL_CAPSULE | ORAL | 2 refills | Status: AC

## 2019-08-30 NOTE — Telephone Encounter (Signed)
Patient called and reported she is having neuropathy pain in her feet that is keeping her awake at night. She has stopped Gabapentin, per the nephrologist. Dr Melford Aase sent an Rx for Lyrica, since her nephology appointment is not until next week.  Patient is aware.

## 2019-08-31 ENCOUNTER — Other Ambulatory Visit: Payer: Self-pay | Admitting: Internal Medicine

## 2019-08-31 DIAGNOSIS — G629 Polyneuropathy, unspecified: Secondary | ICD-10-CM | POA: Diagnosis not present

## 2019-08-31 DIAGNOSIS — N179 Acute kidney failure, unspecified: Secondary | ICD-10-CM | POA: Diagnosis not present

## 2019-08-31 DIAGNOSIS — E871 Hypo-osmolality and hyponatremia: Secondary | ICD-10-CM | POA: Diagnosis not present

## 2019-08-31 DIAGNOSIS — M170 Bilateral primary osteoarthritis of knee: Secondary | ICD-10-CM | POA: Diagnosis not present

## 2019-08-31 DIAGNOSIS — I130 Hypertensive heart and chronic kidney disease with heart failure and stage 1 through stage 4 chronic kidney disease, or unspecified chronic kidney disease: Secondary | ICD-10-CM | POA: Diagnosis not present

## 2019-08-31 DIAGNOSIS — Z9981 Dependence on supplemental oxygen: Secondary | ICD-10-CM | POA: Diagnosis not present

## 2019-08-31 DIAGNOSIS — E039 Hypothyroidism, unspecified: Secondary | ICD-10-CM | POA: Diagnosis not present

## 2019-08-31 DIAGNOSIS — I739 Peripheral vascular disease, unspecified: Secondary | ICD-10-CM | POA: Diagnosis not present

## 2019-08-31 DIAGNOSIS — Z9181 History of falling: Secondary | ICD-10-CM | POA: Diagnosis not present

## 2019-08-31 DIAGNOSIS — M0609 Rheumatoid arthritis without rheumatoid factor, multiple sites: Secondary | ICD-10-CM | POA: Diagnosis not present

## 2019-08-31 DIAGNOSIS — S91001D Unspecified open wound, right ankle, subsequent encounter: Secondary | ICD-10-CM | POA: Diagnosis not present

## 2019-08-31 DIAGNOSIS — E785 Hyperlipidemia, unspecified: Secondary | ICD-10-CM | POA: Diagnosis not present

## 2019-08-31 DIAGNOSIS — I429 Cardiomyopathy, unspecified: Secondary | ICD-10-CM | POA: Diagnosis not present

## 2019-08-31 DIAGNOSIS — I482 Chronic atrial fibrillation, unspecified: Secondary | ICD-10-CM | POA: Diagnosis not present

## 2019-08-31 DIAGNOSIS — I5042 Chronic combined systolic (congestive) and diastolic (congestive) heart failure: Secondary | ICD-10-CM | POA: Diagnosis not present

## 2019-08-31 DIAGNOSIS — E559 Vitamin D deficiency, unspecified: Secondary | ICD-10-CM | POA: Diagnosis not present

## 2019-08-31 DIAGNOSIS — R7303 Prediabetes: Secondary | ICD-10-CM | POA: Diagnosis not present

## 2019-08-31 DIAGNOSIS — J449 Chronic obstructive pulmonary disease, unspecified: Secondary | ICD-10-CM | POA: Diagnosis not present

## 2019-08-31 DIAGNOSIS — G9340 Encephalopathy, unspecified: Secondary | ICD-10-CM | POA: Diagnosis not present

## 2019-08-31 DIAGNOSIS — I5082 Biventricular heart failure: Secondary | ICD-10-CM | POA: Diagnosis not present

## 2019-08-31 DIAGNOSIS — S91302D Unspecified open wound, left foot, subsequent encounter: Secondary | ICD-10-CM | POA: Diagnosis not present

## 2019-08-31 DIAGNOSIS — W19XXXD Unspecified fall, subsequent encounter: Secondary | ICD-10-CM | POA: Diagnosis not present

## 2019-08-31 DIAGNOSIS — N1832 Chronic kidney disease, stage 3b: Secondary | ICD-10-CM | POA: Diagnosis not present

## 2019-08-31 DIAGNOSIS — J9611 Chronic respiratory failure with hypoxia: Secondary | ICD-10-CM | POA: Diagnosis not present

## 2019-08-31 DIAGNOSIS — I2510 Atherosclerotic heart disease of native coronary artery without angina pectoris: Secondary | ICD-10-CM | POA: Diagnosis not present

## 2019-08-31 LAB — URINALYSIS, ROUTINE W REFLEX MICROSCOPIC
Bilirubin Urine: NEGATIVE
Glucose, UA: NEGATIVE
Hgb urine dipstick: NEGATIVE
Hyaline Cast: NONE SEEN /LPF
Ketones, ur: NEGATIVE
Nitrite: POSITIVE — AB
Protein, ur: NEGATIVE
RBC / HPF: NONE SEEN /HPF (ref 0–2)
Specific Gravity, Urine: 1.016 (ref 1.001–1.03)
WBC, UA: >=60 /HPF — AB (ref 0–5)
pH: 5.5 (ref 5.0–8.0)

## 2019-08-31 LAB — URINE CULTURE
MICRO NUMBER:: 10599435
SPECIMEN QUALITY:: ADEQUATE

## 2019-08-31 MED ORDER — CIPROFLOXACIN HCL 250 MG PO TABS
ORAL_TABLET | ORAL | 0 refills | Status: DC
Start: 2019-08-31 — End: 2019-09-11

## 2019-08-31 NOTE — Progress Notes (Signed)
 (+)   E.coli UTI -   Rx sent for Cipro to [facility]   - Recheck U/C at 1 month F/U OV

## 2019-09-03 DIAGNOSIS — I5082 Biventricular heart failure: Secondary | ICD-10-CM | POA: Diagnosis not present

## 2019-09-03 DIAGNOSIS — I2510 Atherosclerotic heart disease of native coronary artery without angina pectoris: Secondary | ICD-10-CM | POA: Diagnosis not present

## 2019-09-03 DIAGNOSIS — E871 Hypo-osmolality and hyponatremia: Secondary | ICD-10-CM | POA: Diagnosis not present

## 2019-09-03 DIAGNOSIS — I739 Peripheral vascular disease, unspecified: Secondary | ICD-10-CM | POA: Diagnosis not present

## 2019-09-03 DIAGNOSIS — I5042 Chronic combined systolic (congestive) and diastolic (congestive) heart failure: Secondary | ICD-10-CM | POA: Diagnosis not present

## 2019-09-03 DIAGNOSIS — J9611 Chronic respiratory failure with hypoxia: Secondary | ICD-10-CM | POA: Diagnosis not present

## 2019-09-03 DIAGNOSIS — I482 Chronic atrial fibrillation, unspecified: Secondary | ICD-10-CM | POA: Diagnosis not present

## 2019-09-03 DIAGNOSIS — E039 Hypothyroidism, unspecified: Secondary | ICD-10-CM | POA: Diagnosis not present

## 2019-09-03 DIAGNOSIS — N179 Acute kidney failure, unspecified: Secondary | ICD-10-CM | POA: Diagnosis not present

## 2019-09-03 DIAGNOSIS — N1832 Chronic kidney disease, stage 3b: Secondary | ICD-10-CM | POA: Diagnosis not present

## 2019-09-03 DIAGNOSIS — E559 Vitamin D deficiency, unspecified: Secondary | ICD-10-CM | POA: Diagnosis not present

## 2019-09-03 DIAGNOSIS — S91001D Unspecified open wound, right ankle, subsequent encounter: Secondary | ICD-10-CM | POA: Diagnosis not present

## 2019-09-03 DIAGNOSIS — W19XXXD Unspecified fall, subsequent encounter: Secondary | ICD-10-CM | POA: Diagnosis not present

## 2019-09-03 DIAGNOSIS — R7303 Prediabetes: Secondary | ICD-10-CM | POA: Diagnosis not present

## 2019-09-03 DIAGNOSIS — S91302D Unspecified open wound, left foot, subsequent encounter: Secondary | ICD-10-CM | POA: Diagnosis not present

## 2019-09-03 DIAGNOSIS — I429 Cardiomyopathy, unspecified: Secondary | ICD-10-CM | POA: Diagnosis not present

## 2019-09-03 DIAGNOSIS — G9340 Encephalopathy, unspecified: Secondary | ICD-10-CM | POA: Diagnosis not present

## 2019-09-03 DIAGNOSIS — Z9981 Dependence on supplemental oxygen: Secondary | ICD-10-CM | POA: Diagnosis not present

## 2019-09-03 DIAGNOSIS — M0609 Rheumatoid arthritis without rheumatoid factor, multiple sites: Secondary | ICD-10-CM | POA: Diagnosis not present

## 2019-09-03 DIAGNOSIS — M170 Bilateral primary osteoarthritis of knee: Secondary | ICD-10-CM | POA: Diagnosis not present

## 2019-09-03 DIAGNOSIS — E785 Hyperlipidemia, unspecified: Secondary | ICD-10-CM | POA: Diagnosis not present

## 2019-09-03 DIAGNOSIS — J449 Chronic obstructive pulmonary disease, unspecified: Secondary | ICD-10-CM | POA: Diagnosis not present

## 2019-09-03 DIAGNOSIS — G629 Polyneuropathy, unspecified: Secondary | ICD-10-CM | POA: Diagnosis not present

## 2019-09-03 DIAGNOSIS — Z9181 History of falling: Secondary | ICD-10-CM | POA: Diagnosis not present

## 2019-09-03 DIAGNOSIS — I130 Hypertensive heart and chronic kidney disease with heart failure and stage 1 through stage 4 chronic kidney disease, or unspecified chronic kidney disease: Secondary | ICD-10-CM | POA: Diagnosis not present

## 2019-09-04 ENCOUNTER — Telehealth: Payer: Self-pay | Admitting: *Deleted

## 2019-09-04 NOTE — Telephone Encounter (Signed)
Patient called and states the home health nurse advised the patient to call the office regarding possible cellulitis on the patient's legs. She states the left legs is worse than the right and the bumps are weeping. Patient will need an office visit to evaluate. Patient has an appointment 09/05/2019 with Dr Melford Aase.

## 2019-09-04 NOTE — Telephone Encounter (Signed)
Nash Shearer, from [place], called and reported the patient informed him she was receiving home health from [facility].

## 2019-09-05 ENCOUNTER — Ambulatory Visit (INDEPENDENT_AMBULATORY_CARE_PROVIDER_SITE_OTHER): Payer: Medicare Other | Admitting: Internal Medicine

## 2019-09-05 ENCOUNTER — Encounter: Payer: Self-pay | Admitting: Internal Medicine

## 2019-09-05 ENCOUNTER — Other Ambulatory Visit: Payer: Self-pay

## 2019-09-05 ENCOUNTER — Ambulatory Visit: Payer: Medicare Other | Admitting: Internal Medicine

## 2019-09-05 VITALS — BP 142/78 | HR 64 | Temp 97.0°F | Resp 16 | Ht 63.5 in | Wt 180.2 lb

## 2019-09-05 DIAGNOSIS — N183 Chronic kidney disease, stage 3 unspecified: Secondary | ICD-10-CM | POA: Diagnosis not present

## 2019-09-05 DIAGNOSIS — I10 Essential (primary) hypertension: Secondary | ICD-10-CM

## 2019-09-05 DIAGNOSIS — I872 Venous insufficiency (chronic) (peripheral): Secondary | ICD-10-CM

## 2019-09-05 DIAGNOSIS — D649 Anemia, unspecified: Secondary | ICD-10-CM | POA: Diagnosis not present

## 2019-09-05 DIAGNOSIS — N179 Acute kidney failure, unspecified: Secondary | ICD-10-CM | POA: Diagnosis not present

## 2019-09-05 DIAGNOSIS — I129 Hypertensive chronic kidney disease with stage 1 through stage 4 chronic kidney disease, or unspecified chronic kidney disease: Secondary | ICD-10-CM | POA: Diagnosis not present

## 2019-09-05 DIAGNOSIS — N2581 Secondary hyperparathyroidism of renal origin: Secondary | ICD-10-CM | POA: Diagnosis not present

## 2019-09-05 MED ORDER — CEPHALEXIN 250 MG PO CAPS: ORAL_CAPSULE | ORAL | 0 refills | Status: DC

## 2019-09-05 NOTE — Progress Notes (Signed)
History of Present Illness:      This nice 79 yo MWF with HTN, HLD, ASHD/cAfib, chCHF with an BiVent AICD, CKD3,Prediabetes, Hypothyroidism, Gout , Rheumatoid Arthritis and Vitamin D Deficiency returns with c/o persistent edema of her legs with development of a reddish rash with vesiculation  and weeping. Her Torsemide 20 mg had been decreased from 2 tabs bid to 1 tab bid. Labs post hospital showed improvement in kidney function of GFR up from 20 to 28 and then 42 at OV.   Medications  Current Outpatient Medications (Endocrine & Metabolic):  .  levothyroxine (SYNTHROID) 125 MCG tablet, Take 1 tablet (125 mcg total) by mouth daily before breakfast.  Current Outpatient Medications (Cardiovascular):  .  pravastatin (PRAVACHOL) 40 MG tablet, Take 20 mg by mouth at bedtime. .  torsemide (DEMADEX) 20 MG tablet, Take 2 tablets (40 mg total) by mouth 2 (two) times daily.  Current Outpatient Medications (Respiratory):  .  albuterol (PROAIR HFA) 108 (90 Base) MCG/ACT inhaler, Use 2 Inhalations 15 minutes Apart every 4 hours to Rescue Asthma Attack (Patient taking differently: Inhale 2 puffs into the lungs See admin instructions. Use 2 inhalations 15 minutes apart every 4 hours as needed for asthma attack) .  budesonide-formoterol (SYMBICORT) 80-4.5 MCG/ACT inhaler, Use 2 inhalations 30 minutes apart 2 x /day (every 12 hours) .  cetirizine (ZYRTEC) 10 MG tablet, Take 10 mg by mouth daily as needed for allergies.  .  Guaifenesin (MUCINEX MAXIMUM STRENGTH) 1200 MG TB12, Take 1,200 mg by mouth 2 (two) times daily as needed (cough/congestion). Marland Kitchen  ipratropium (ATROVENT) 0.06 % nasal spray, Use 1 to 2 sprays each Nostril 2 to 3 x / day as needed (Patient taking differently: Place 1-2 sprays into both nostrils 3 (three) times daily as needed for rhinitis (congestion). ) .  ipratropium-albuterol (DUONEB) 0.5-2.5 (3) MG/3ML SOLN, Inhale 3 mLs into the lungs every 6 (six) hours as needed (for shortness of  breath or wheezing). .  montelukast (SINGULAIR) 10 MG tablet, TAKE 1 TABLET DAILY FOR ALLERGIES & ASTHMA (Patient taking differently: Take 10 mg by mouth daily. for Allergies & Asthma)  Current Outpatient Medications (Analgesics):  .  allopurinol (ZYLOPRIM) 300 MG tablet, TAKE 1 TABLET BY MOUTH EVERY DAY (Patient taking differently: Take 300 mg by mouth daily. ) .  aspirin EC 81 MG tablet, Take 81 mg by mouth daily. Marland Kitchen  leflunomide (ARAVA) 20 MG tablet, Take 10 mg by mouth daily.   Current Outpatient Medications (Hematological):  Marland Kitchen  Rivaroxaban (XARELTO) 15 MG TABS tablet, Take 1 tablet Daily for Afib &  to Prevent Blood Clots (Patient taking differently: Take 15 mg by mouth daily. for Afib &  to Prevent Blood Clots)  Current Outpatient Medications (Other):  .  ciprofloxacin (CIPRO) 250 MG tablet, Take 1 tablet 2 x / day with food for Infection .  Eyelid Cleansers (AVENOVA) 0.01 % SOLN, Apply 1 application topically See admin instructions. Wash eyelids twice daily with cleaner .  fluconazole (DIFLUCAN) 150 MG tablet, Take 1 tablet 2 x /week  if needed for yeast infection .  L-Lysine 500 MG TABS, Take 500 mg by mouth daily.  .  Olopatadine HCl 0.2 % SOLN, Place 1 drop into both eyes daily as needed (dry eyes).  .  OXYGEN, Inhale 2 L into the lungs continuous. [name] Glycol-Propyl Glycol (SYSTANE) 0.4-0.3 % SOLN, Place 1 drop into both eyes 4 (four) times daily as needed (for dry eyes).  Marland Kitchen  potassium chloride SA (KLOR-CON) 20 MEQ tablet, Take 20 mEq by mouth daily.  .  pregabalin (LYRICA) 25 MG capsule, Take 1 capsule 2 to 3 x /day as needed for Neuropathy pain  Problem list She has Essential hypertension; Atherosclerosis of native coronary artery of native heart without angina pectoris; Atrial fibrillation ([place]); Chronic combined systolic and diastolic CHF (congestive heart failure) ([place]); Chronic obstructive pulmonary disease with hypoxia ([place]); Gastroesophageal reflux disease without  esophagitis; Osteopenia; Hypothyroidism; Vitamin D deficiency; Long term current use of anticoagulant therapy; CKD (chronic kidney disease) stage 3, GFR 30-59 ml/min; Hyperlipidemia, mixed; Pulmonary Fibrosis sequellae of Amiodarone; PVD (peripheral vascular disease) with claudication ([place]); AAA (abdominal aortic aneurysm) without rupture ([place]); Abnormal glucose; Gout; PAH (pulmonary artery hypertension) ([place]); Aortic atherosclerosis ([facility]); COPD (chronic obstructive pulmonary disease) with chronic bronchitis ([place]); 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); Obesity (BMI 30.0-34.9); and Acute encephalopathy on their problem list.   Observations/Objective:   BP (!) 142/78   Pulse 64   Temp (!) 97 F (36.1 C)   Resp 16   Ht 5' 3.5" (1.613 m)   Wt 180 lb 3.2 oz (81.7 kg)   BMI 31.42 kg/m   HEENT - WNL. Neck - supple.  Chest - Clear equal BS. Cor - Nl HS. RRR w/o sig MGR. PP obscured by edema.   2 (+) pretibial, ankle & pedal edema.  MS- FROM w/o deformities.  Gait Nl. Neuro -  Nl w/o focal abnormalities. Skin - Increase erythema of distal shins Lt>> Rt Assessment and Plan:  1. Essential hypertension  2. Edema of both lower extremities due to peripheral venous insufficiency  3. Venous stasis dermatitis of left lower extremity  - cephALEXin (KEFLEX) 250 MG capsule; Take 1 capsule 4 x /day with Meals & Bedtime for Skin Infection  Dispense: 60 capsule; Refill: 0  - Advised decrease Allopurinol 300 mg to 1/2 tablet Daily  - Change torsemide 20 mg 1 tab  bid to 2 tabs qam to see if promotes a greater diuresis     Follow Up Instructions: - Advised taper Allopurinol 300 mg to 1/2 tab qd and will refill with 100 mg when needed  - Advised change Torsemide 20 mg  To 2 tabs qam to hopefully stimulayte a greater diuretic response to reduce the dependent edema        I discussed  the assessment and treatment plan with the patient. The patient was provided an opportunity to ask questions and all were answered. The patient agreed with the plan and demonstrated an understanding of the instructions.       The patient was advised to call back or seek an in-person evaluation if the symptoms worsen or if the condition fails to improve as anticipated.  Kirtland Bouchard, MD

## 2019-09-07 DIAGNOSIS — J449 Chronic obstructive pulmonary disease, unspecified: Secondary | ICD-10-CM | POA: Diagnosis not present

## 2019-09-07 DIAGNOSIS — J9611 Chronic respiratory failure with hypoxia: Secondary | ICD-10-CM | POA: Diagnosis not present

## 2019-09-07 DIAGNOSIS — E785 Hyperlipidemia, unspecified: Secondary | ICD-10-CM | POA: Diagnosis not present

## 2019-09-07 DIAGNOSIS — E871 Hypo-osmolality and hyponatremia: Secondary | ICD-10-CM | POA: Diagnosis not present

## 2019-09-07 DIAGNOSIS — I2510 Atherosclerotic heart disease of native coronary artery without angina pectoris: Secondary | ICD-10-CM | POA: Diagnosis not present

## 2019-09-07 DIAGNOSIS — I482 Chronic atrial fibrillation, unspecified: Secondary | ICD-10-CM | POA: Diagnosis not present

## 2019-09-07 DIAGNOSIS — M0609 Rheumatoid arthritis without rheumatoid factor, multiple sites: Secondary | ICD-10-CM | POA: Diagnosis not present

## 2019-09-07 DIAGNOSIS — W19XXXD Unspecified fall, subsequent encounter: Secondary | ICD-10-CM | POA: Diagnosis not present

## 2019-09-07 DIAGNOSIS — I5082 Biventricular heart failure: Secondary | ICD-10-CM | POA: Diagnosis not present

## 2019-09-07 DIAGNOSIS — I429 Cardiomyopathy, unspecified: Secondary | ICD-10-CM | POA: Diagnosis not present

## 2019-09-07 DIAGNOSIS — Z9981 Dependence on supplemental oxygen: Secondary | ICD-10-CM | POA: Diagnosis not present

## 2019-09-07 DIAGNOSIS — G9340 Encephalopathy, unspecified: Secondary | ICD-10-CM | POA: Diagnosis not present

## 2019-09-07 DIAGNOSIS — R7303 Prediabetes: Secondary | ICD-10-CM | POA: Diagnosis not present

## 2019-09-07 DIAGNOSIS — M170 Bilateral primary osteoarthritis of knee: Secondary | ICD-10-CM | POA: Diagnosis not present

## 2019-09-07 DIAGNOSIS — N179 Acute kidney failure, unspecified: Secondary | ICD-10-CM | POA: Diagnosis not present

## 2019-09-07 DIAGNOSIS — I130 Hypertensive heart and chronic kidney disease with heart failure and stage 1 through stage 4 chronic kidney disease, or unspecified chronic kidney disease: Secondary | ICD-10-CM | POA: Diagnosis not present

## 2019-09-07 DIAGNOSIS — E559 Vitamin D deficiency, unspecified: Secondary | ICD-10-CM | POA: Diagnosis not present

## 2019-09-07 DIAGNOSIS — I739 Peripheral vascular disease, unspecified: Secondary | ICD-10-CM | POA: Diagnosis not present

## 2019-09-07 DIAGNOSIS — E039 Hypothyroidism, unspecified: Secondary | ICD-10-CM | POA: Diagnosis not present

## 2019-09-07 DIAGNOSIS — Z9181 History of falling: Secondary | ICD-10-CM | POA: Diagnosis not present

## 2019-09-07 DIAGNOSIS — G629 Polyneuropathy, unspecified: Secondary | ICD-10-CM | POA: Diagnosis not present

## 2019-09-07 DIAGNOSIS — N1832 Chronic kidney disease, stage 3b: Secondary | ICD-10-CM | POA: Diagnosis not present

## 2019-09-07 DIAGNOSIS — I5042 Chronic combined systolic (congestive) and diastolic (congestive) heart failure: Secondary | ICD-10-CM | POA: Diagnosis not present

## 2019-09-07 DIAGNOSIS — S91302D Unspecified open wound, left foot, subsequent encounter: Secondary | ICD-10-CM | POA: Diagnosis not present

## 2019-09-07 DIAGNOSIS — S91001D Unspecified open wound, right ankle, subsequent encounter: Secondary | ICD-10-CM | POA: Diagnosis not present

## 2019-09-09 NOTE — Telephone Encounter (Signed)
If she is still feeing bad, I can see her back next week. GT

## 2019-09-10 DIAGNOSIS — E559 Vitamin D deficiency, unspecified: Secondary | ICD-10-CM | POA: Diagnosis not present

## 2019-09-10 DIAGNOSIS — M170 Bilateral primary osteoarthritis of knee: Secondary | ICD-10-CM | POA: Diagnosis not present

## 2019-09-10 DIAGNOSIS — I5082 Biventricular heart failure: Secondary | ICD-10-CM | POA: Diagnosis not present

## 2019-09-10 DIAGNOSIS — I5042 Chronic combined systolic (congestive) and diastolic (congestive) heart failure: Secondary | ICD-10-CM | POA: Diagnosis not present

## 2019-09-10 DIAGNOSIS — N1832 Chronic kidney disease, stage 3b: Secondary | ICD-10-CM | POA: Diagnosis not present

## 2019-09-10 DIAGNOSIS — I429 Cardiomyopathy, unspecified: Secondary | ICD-10-CM | POA: Diagnosis not present

## 2019-09-10 DIAGNOSIS — J9611 Chronic respiratory failure with hypoxia: Secondary | ICD-10-CM | POA: Diagnosis not present

## 2019-09-10 DIAGNOSIS — N179 Acute kidney failure, unspecified: Secondary | ICD-10-CM | POA: Diagnosis not present

## 2019-09-10 DIAGNOSIS — E785 Hyperlipidemia, unspecified: Secondary | ICD-10-CM | POA: Diagnosis not present

## 2019-09-10 DIAGNOSIS — S91001D Unspecified open wound, right ankle, subsequent encounter: Secondary | ICD-10-CM | POA: Diagnosis not present

## 2019-09-10 DIAGNOSIS — G629 Polyneuropathy, unspecified: Secondary | ICD-10-CM | POA: Diagnosis not present

## 2019-09-10 DIAGNOSIS — Z9981 Dependence on supplemental oxygen: Secondary | ICD-10-CM | POA: Diagnosis not present

## 2019-09-10 DIAGNOSIS — E871 Hypo-osmolality and hyponatremia: Secondary | ICD-10-CM | POA: Diagnosis not present

## 2019-09-10 DIAGNOSIS — I2510 Atherosclerotic heart disease of native coronary artery without angina pectoris: Secondary | ICD-10-CM | POA: Diagnosis not present

## 2019-09-10 DIAGNOSIS — Z9181 History of falling: Secondary | ICD-10-CM | POA: Diagnosis not present

## 2019-09-10 DIAGNOSIS — W19XXXD Unspecified fall, subsequent encounter: Secondary | ICD-10-CM | POA: Diagnosis not present

## 2019-09-10 DIAGNOSIS — G9340 Encephalopathy, unspecified: Secondary | ICD-10-CM | POA: Diagnosis not present

## 2019-09-10 DIAGNOSIS — R7303 Prediabetes: Secondary | ICD-10-CM | POA: Diagnosis not present

## 2019-09-10 DIAGNOSIS — M0609 Rheumatoid arthritis without rheumatoid factor, multiple sites: Secondary | ICD-10-CM | POA: Diagnosis not present

## 2019-09-10 DIAGNOSIS — I482 Chronic atrial fibrillation, unspecified: Secondary | ICD-10-CM | POA: Diagnosis not present

## 2019-09-10 DIAGNOSIS — I739 Peripheral vascular disease, unspecified: Secondary | ICD-10-CM | POA: Diagnosis not present

## 2019-09-10 DIAGNOSIS — J449 Chronic obstructive pulmonary disease, unspecified: Secondary | ICD-10-CM | POA: Diagnosis not present

## 2019-09-10 DIAGNOSIS — I130 Hypertensive heart and chronic kidney disease with heart failure and stage 1 through stage 4 chronic kidney disease, or unspecified chronic kidney disease: Secondary | ICD-10-CM | POA: Diagnosis not present

## 2019-09-10 DIAGNOSIS — E039 Hypothyroidism, unspecified: Secondary | ICD-10-CM | POA: Diagnosis not present

## 2019-09-10 DIAGNOSIS — S91302D Unspecified open wound, left foot, subsequent encounter: Secondary | ICD-10-CM | POA: Diagnosis not present

## 2019-09-11 ENCOUNTER — Ambulatory Visit (INDEPENDENT_AMBULATORY_CARE_PROVIDER_SITE_OTHER): Payer: Medicare Other | Admitting: Physician Assistant

## 2019-09-11 ENCOUNTER — Other Ambulatory Visit: Payer: Self-pay | Admitting: *Deleted

## 2019-09-11 ENCOUNTER — Encounter: Payer: Self-pay | Admitting: Physician Assistant

## 2019-09-11 ENCOUNTER — Other Ambulatory Visit: Payer: Self-pay

## 2019-09-11 VITALS — BP 132/82 | HR 63 | Temp 97.3°F | Ht 63.5 in | Wt 179.0 lb

## 2019-09-11 DIAGNOSIS — M0609 Rheumatoid arthritis without rheumatoid factor, multiple sites: Secondary | ICD-10-CM

## 2019-09-11 DIAGNOSIS — I872 Venous insufficiency (chronic) (peripheral): Secondary | ICD-10-CM

## 2019-09-11 DIAGNOSIS — L89891 Pressure ulcer of other site, stage 1: Secondary | ICD-10-CM

## 2019-09-11 DIAGNOSIS — M2559 Pain in other specified joint: Secondary | ICD-10-CM | POA: Diagnosis not present

## 2019-09-11 DIAGNOSIS — Z79899 Other long term (current) drug therapy: Secondary | ICD-10-CM | POA: Diagnosis not present

## 2019-09-11 DIAGNOSIS — I739 Peripheral vascular disease, unspecified: Secondary | ICD-10-CM | POA: Diagnosis not present

## 2019-09-11 DIAGNOSIS — I5042 Chronic combined systolic (congestive) and diastolic (congestive) heart failure: Secondary | ICD-10-CM | POA: Diagnosis not present

## 2019-09-11 MED ORDER — FAMOTIDINE 40 MG PO TABS: 40.0000 mg | ORAL_TABLET | Freq: Every evening | ORAL | 1 refills | Status: DC

## 2019-09-11 MED ORDER — DEXAMETHASONE 0.5 MG PO TABS: ORAL_TABLET | ORAL | 0 refills | Status: DC

## 2019-09-11 NOTE — Patient Instructions (Addendum)
Take the decadron for the arthritis Take it WITH food Take it with pepcid for now- can cause heart burn  Continue to monitor your weight at home Increase the demadex to 2 in the morning and 1 in the afternoon If you lose 5 lbs cut back to just 2 in the morning  Follow up 1 week Will try to get nephrology notes and will think about changes fluid pills May refer to cardio   Do the following things EVERYDAY: 1) Weigh yourself in the morning before breakfast or at the same time every day. Write it down and keep it in a log. 2) Take your medicines as prescribed 3) Eat low salt foods--Limit salt (sodium) to 2000 mg per day. Best thing to do is avoid processed foods.   4) Stay as active as you can everyday 5) Limit all fluids for the day to less than 1.5 liters  Call your doctor if:  Anytime you have any of the following symptoms:  1) 2 pound weight gain in 24 hours or 5 pounds in 1 week  2) shortness of breath, with or without a dry hacking cough  3) swelling in the hands, LEGs, feet or stomach  4) if you have to sleep on extra pillows at night in order to breathe. 5) after laying down at night for 20-30 mins, you wake up short of breath.   These can all be signs of fluid overload.    Heart Failure Heart failure means your heart has trouble pumping blood. This makes it hard for your body to work well. Heart failure is usually a long-term (chronic) condition. You must take good care of yourself and follow your doctor's treatment plan. Follow these instructions at home:  Take your heart medicine as told by your doctor. ? Do not stop taking medicine unless your doctor tells you to. ? Do not skip any dose of medicine. ? Refill your medicines before they run out. ? Take other medicines only as told by your doctor or pharmacist.  Stay active if told by your doctor. The elderly and people with severe heart failure should talk with a doctor about physical activity.  Eat heart-healthy  foods. Choose foods that are without trans fat and are low in saturated fat, cholesterol, and salt (sodium). This includes fresh or frozen fruits and vegetables, fish, lean meats, fat-free or low-fat dairy foods, whole grains, and high-fiber foods. Lentils and dried peas and beans (legumes) are also good choices.  Limit salt if told by your doctor.  Cook in a healthy way. Roast, grill, broil, bake, poach, steam, or stir-fry foods.  Limit fluids as told by your doctor.  Weigh yourself every morning. Do this after you pee (urinate) and before you eat breakfast. Write down your weight to give to your doctor.  Take your blood pressure and write it down if your doctor tells you to.  Ask your doctor how to check your pulse. Check your pulse as told.  Lose weight if told by your doctor.  Stop smoking or chewing tobacco. Do not use gum or patches that help you quit without your doctor's approval.  Schedule and go to doctor visits as told.  Nonpregnant women should have no more than 1 drink a day. Men should have no more than 2 drinks a day. Talk to your doctor about drinking alcohol.  Stop illegal drug use.  Stay current with shots (immunizations).  Manage your health conditions as told by your doctor.  Learn to  manage your stress.  Rest when you are tired.  If it is really hot outside: ? Avoid intense activities. ? Use air conditioning or fans, or get in a cooler place. ? Avoid caffeine and alcohol. ? Wear loose-fitting, lightweight, and light-colored clothing.  If it is really cold outside: ? Avoid intense activities. ? Layer your clothing. ? Wear mittens or gloves, a hat, and a scarf when going outside. ? Avoid alcohol.  Learn about heart failure and get support as needed.  Get help to maintain or improve your quality of life and your ability to care for yourself as needed. Contact a doctor if:  You gain weight quickly.  You are more short of breath than usual.  You  cannot do your normal activities.  You tire easily.  You cough more than normal, especially with activity.  You have any or more puffiness (swelling) in areas such as your hands, feet, ankles, or belly (abdomen).  You cannot sleep because it is hard to breathe.  You feel like your heart is beating fast (palpitations).  You get dizzy or light-headed when you stand up. Get help right away if:  You have trouble breathing.  There is a change in mental status, such as becoming less alert or not being able to focus.  You have chest pain or discomfort.  You faint. This information is not intended to replace advice given to you by your health care provider. Make sure you discuss any questions you have with your health care provider. Document Released: 12/09/2007 Document Revised: 08/07/2015 Document Reviewed: 04/17/2012 Elsevier Interactive Patient Education  2017 [employer].

## 2019-09-11 NOTE — Patient Outreach (Signed)
[facility]) Care Management  09/11/2019  [name] 1940-07-27 [id number]  Telephone outreach:  Katie [name] had a hospitalization and short SNF stay earlier this month. She is now being followed by home health for wound care and PT. She was seen in her primary care office yesterday and a referral was put in for her to go to the wound center for care for her foot wounds.   Otherwise, [name], says she is doing very well, no other problems. Her family is visiting her at present and checks on her often. She is fiercely independent.  I have a previously scheduled appt with her in October which I will keep and reminded her of this.  [name]. [name], MSN, [facility] Gerontological Nurse Practitioner [facility] Care Management [phone]

## 2019-09-11 NOTE — Progress Notes (Signed)
FOLLOW UP 3 Month  Assessment and Plan:  Katie [name] was seen today for acute visit, edema and arthritis.  Diagnoses and all orders for this visit:  Edema of both lower extremities due to peripheral venous insufficiency Monitor weight daily Increase toresimide to 2 in AM and 1 at 2 pm ? Benefit from small addition of spirolactone versus referral to CHF clinic  Will try to get nephrology notes Will do very close follow up 1 week  Chronic combined systolic and diastolic CHF (congestive heart failure) (HCC) Monitor weight daily Increase toresimide to 2 in AM and 1 at 2 pm ? Benefit from small addition of spirolactone versus referral to CHF clinic  Will try to get nephrology notes  Rheumatoid arthritis of multiple sites with negative rheumatoid factor (HCC) -     dexamethasone (DECADRON) 0.5 MG tablet; take 1 tablet PO BID for 3 days, then take 1 tablet PO for 4 days. -     famotidine (PEPCID) 40 MG tablet; Take 1 tablet (40 mg total) by mouth every evening. - follow up with Dr. Dossie Der  Pressure injury of lower extremity, stage 1 -     VAS [place] ABI WITH/WO TBI -     Ambulatory referral to [facility] and dressed today, will refer to wound clinic, get ABI, and home health can continue to follow up with patient at home for wounds and since it is difficult for the patient to leave the house.   Claudication of both lower extremities (HCC) -     VAS [place] ABI WITH/WO TBI  Pain in other joint -     Uric acid -     dexamethasone (DECADRON) 0.5 MG tablet; take 1 tablet PO BID for 3 days, then take 1 tablet PO for 4 days.  Medication management -     CBC with Differential/Platelet -     COMPLETE METABOLIC PANEL WITH GFR -     Magnesium      Continue diet and meds as discussed. Further disposition pending results of labs. Discussed med's effects and SE's.   Over 30 minutes of exam, counseling, chart review, and critical decision making was performed.   Future Appointments  Date  Time Provider [place]  09/11/2019  3:00 PM Deloria Lair, NP [facility] None  09/20/2019 11:30 AM Vicie Mutters, PA-C [facility] None  09/26/2019  2:30 PM [name], MD [facility] None  11/22/2019  7:35 AM [facility] DEVICE REMOTES [facility]  11/30/2019 10:30 AM Vicie Mutters, PA-C [facility] None  12/31/2019  9:00 AM Deloria Lair, NP [facility] None  02/21/2020  7:35 AM [facility] DEVICE REMOTES [facility]  03/17/2020  9:00 AM [name], MD [facility] None  06/16/2020 11:15 AM Liane Comber, NP [facility] None    ----------------------------------------------------------------------------------------------------------------------  HPI 79 y.o. female with history of AAA s/p repair, Afib on xarleto, AICD, CHF, (following with Dr. 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 Melvyn Novas), she has RA and following with Dr. Dossie Der presents for follow up for bilateral leg swelling.   She was admitted to hospital and then transferred to [facility] 06/05 for electrolyte abnormality, dig toxity and her demadex 20 mg was reduced from 4 a day to 1 a day after the hospital visit. She presented with bilateral leg swelling/weeping and possible infection on 06/23 and her demadex was increased to 2 in the AM and she was started on keflex.   She was started on cipro for UTI, keflex for an  infection of her legs and given prednisone by RA pain for bilateral leg and right hand pain but she states this weekend she had horrible nausea, states had "bubbling up in her throat:. She called Dr. Melford Aase and was taken off prednisone and cipro, she is just on the keflex at this time.   She continues to have swelling, she states she has some increase urination with the demadex in the morning but it does not last long. She is weighing herself daily and states her weight is down 1-2 lbs. She wears oxygen at night, denies worse  SOB, cough, orthopenia or PND. States her legs continue to hurt her, bilateral pain, and she is having severe right hand pain. She is on potassium once a day.   She fell on a concrete slab going up the stairs 05/25, she still has bilateral wounds on her feet. Having home health nurse, [place], come out to dress it. She had ABI 2019, s/p right femoral angioplasty. She states wounds have not healed and she is in a lot of pain. The redness and warmth her legs have decreased.   She denies fever, chills.  BMI is Body mass index is 31.21 kg/m., she is working on diet and exercise. Wt Readings from Last 3 Encounters:  09/11/19 179 lb (81.2 kg)  09/05/19 180 lb 3.2 oz (81.7 kg)  08/29/19 181 lb (82.1 kg)     Her blood pressure has been controlled at home, today their BP is BP: 132/82  Lab Results  Component Value Date   CREATININE 1.23 (H) 08/27/2019   BUN 21 08/27/2019   NA 140 08/27/2019   K 4.5 08/27/2019   CL 105 08/27/2019   CO2 28 08/27/2019    She follows  Lab Results  Component Value Date   GFRNONAA 42 (L) 08/27/2019    Current Medications:   Current Outpatient Medications (Endocrine & Metabolic):  .  levothyroxine (SYNTHROID) 125 MCG tablet, Take 1 tablet (125 mcg total) by mouth daily before breakfast. .  dexamethasone (DECADRON) 0.5 MG tablet, take 1 tablet PO BID for 3 days, then take 1 tablet PO for 4 days.  Current Outpatient Medications (Cardiovascular):  .  pravastatin (PRAVACHOL) 40 MG tablet, Take 20 mg by mouth at bedtime. .  torsemide (DEMADEX) 20 MG tablet, Take 2 tablets (40 mg total) by mouth 2 (two) times daily.  Current Outpatient Medications (Respiratory):  .  albuterol (PROAIR HFA) 108 (90 Base) MCG/ACT inhaler, Use 2 Inhalations 15 minutes Apart every 4 hours to Rescue Asthma Attack (Patient taking differently: Inhale 2 puffs into the lungs See admin instructions. Use 2 inhalations 15 minutes apart every 4 hours as needed for asthma attack) .   budesonide-formoterol (SYMBICORT) 80-4.5 MCG/ACT inhaler, Use 2 inhalations 30 minutes apart 2 x /day (every 12 hours) .  cetirizine (ZYRTEC) 10 MG tablet, Take 10 mg by mouth daily as needed for allergies.  .  Guaifenesin (MUCINEX MAXIMUM STRENGTH) 1200 MG TB12, Take 1,200 mg by mouth 2 (two) times daily as needed (cough/congestion). Marland Kitchen  ipratropium (ATROVENT) 0.06 % nasal spray, Use 1 to 2 sprays each Nostril 2 to 3 x / day as needed (Patient taking differently: Place 1-2 sprays into both nostrils 3 (three) times daily as needed for rhinitis (congestion). ) .  ipratropium-albuterol (DUONEB) 0.5-2.5 (3) MG/3ML SOLN, Inhale 3 mLs into the lungs every 6 (six) hours as needed (for shortness of breath or wheezing). .  montelukast (SINGULAIR) 10 MG tablet, TAKE 1 TABLET DAILY FOR  ALLERGIES & ASTHMA (Patient taking differently: Take 10 mg by mouth daily. for Allergies & Asthma)  Current Outpatient Medications (Analgesics):  .  allopurinol (ZYLOPRIM) 300 MG tablet, TAKE 1 TABLET BY MOUTH EVERY DAY (Patient taking differently: Take 300 mg by mouth daily. ) .  aspirin EC 81 MG tablet, Take 81 mg by mouth daily. Marland Kitchen  leflunomide (ARAVA) 20 MG tablet, Take 10 mg by mouth daily.   Current Outpatient Medications (Hematological):  Marland Kitchen  Rivaroxaban (XARELTO) 15 MG TABS tablet, Take 1 tablet Daily for Afib &  to Prevent Blood Clots (Patient taking differently: Take 15 mg by mouth daily. for Afib &  to Prevent Blood Clots)  Current Outpatient Medications (Other):  .  cephALEXin (KEFLEX) 250 MG capsule, Take 1 capsule 4 x /day with Meals & Bedtime for Skin Infection .  Eyelid Cleansers (AVENOVA) 0.01 % SOLN, Apply 1 application topically See admin instructions. Wash eyelids twice daily with cleaner .  L-Lysine 500 MG TABS, Take 500 mg by mouth daily.  .  Olopatadine HCl 0.2 % SOLN, Place 1 drop into both eyes daily as needed (dry eyes).  .  OXYGEN, Inhale 2 L into the lungs continuous. [name] Glycol-Propyl  Glycol (SYSTANE) 0.4-0.3 % SOLN, Place 1 drop into both eyes 4 (four) times daily as needed (for dry eyes).  .  potassium chloride SA (KLOR-CON) 20 MEQ tablet, Take 20 mEq by mouth daily.  .  pregabalin (LYRICA) 25 MG capsule, Take 1 capsule 2 to 3 x /day as needed for Neuropathy pain .  famotidine (PEPCID) 40 MG tablet, Take 1 tablet (40 mg total) by mouth every evening.   Allergies:  Allergies  Allergen Reactions  . Amiodarone Other (See Comments)    PULMONARY TOXICITY  . Diovan [Valsartan] Other (See Comments)    HYPOTENSION  . Doxycycline Diarrhea and Other (See Comments)    VISUAL DISTURBANCE  . Flexeril [Cyclobenzaprine] Other (See Comments)    FATIGUE  . Keflex [Cephalexin] Diarrhea  . Verapamil Other (See Comments)    EDEMA  . Codeine Hives     Medical History:  Past Medical History:  Diagnosis Date  . AAA (abdominal aortic aneurysm) ([place])   . AICD (automatic cardioverter/defibrillator) present 11/10/2017  . Arthritis    "some in my knees" (11/10/2017)  . Atrial fibrillation ([place])   . CHF (congestive heart failure) ([place])   . Chronic bronchitis ([place])   . COPD (chronic obstructive pulmonary disease) ([place])   . Depression   . GERD (gastroesophageal reflux disease)   . Gout    "daily RX" (11/10/2017)  . History of hiatal hernia   . Hyperlipidemia   . Hypertension   . Hypothyroid   . Migraine headache    "hx; none since 1980s" (11/10/2017)  . Osteopenia   . Pneumonia    "~ 3 times" (11/10/2017)  . PVD (peripheral vascular disease) ([place])    Family history- Reviewed and unchanged Social history- Reviewed and unchanged  Review of Systems:  Review of Systems  Constitutional: Negative for chills, diaphoresis, fever, malaise/fatigue and weight loss.  HENT: Negative for congestion, ear discharge, ear pain, hearing loss, nosebleeds, sinus pain, sore throat and tinnitus.   Eyes: Negative for blurred vision, double vision, pain, discharge and redness.  Respiratory:  Negative for cough, hemoptysis, sputum production, shortness of breath and wheezing.   Cardiovascular: Positive for leg swelling. Negative for chest pain, palpitations, orthopnea, claudication and PND.       Mild but resolves over  night.  Gastrointestinal: Positive for nausea. Negative for abdominal pain, blood in stool, constipation, diarrhea, heartburn, melena and vomiting.  Genitourinary: Negative for dysuria, flank pain, frequency, hematuria and urgency.  Musculoskeletal: Positive for back pain and joint pain. Negative for falls, myalgias and neck pain.       Left ankle pain  Skin: Negative for itching and rash.       Top of left foot  Neurological: Positive for tingling (bil feet) and sensory change (bil feet and ankles neuropathic pain worse). Negative for dizziness, tremors, seizures, loss of consciousness, weakness and headaches.  Endo/Heme/Allergies: Positive for environmental allergies. Negative for polydipsia. Does not bruise/bleed easily.  Psychiatric/Behavioral: Negative for depression, memory loss and suicidal ideas. The patient is not nervous/anxious and does not have insomnia.       Physical Exam: BP 132/82   Pulse 63   Temp (!) 97.3 F (36.3 C)   Ht 5' 3.5" (1.613 m)   Wt 179 lb (81.2 kg)   SpO2 97%   BMI 31.21 kg/m  Wt Readings from Last 3 Encounters:  09/11/19 179 lb (81.2 kg)  09/05/19 180 lb 3.2 oz (81.7 kg)  08/29/19 181 lb (82.1 kg)   General Appearance: Well nourished, in no apparent distress. Eyes: PERRLA, EOMs, conjunctiva no swelling or erythema Sinuses: No Frontal/maxillary tenderness ENT/Mouth: Ext aud canals clear, TMs without erythema, bulging. Right TM, serous noted behind TM. No erythema, swelling, or exudate on post pharynx.  Tonsils not swollen or erythematous. Hearing normal.  Neck: Supple, thyroid normal.  Respiratory: Respiratory effort normal, BS equal bilaterally without rales, rhonchi, wheezing or stridor.  Cardio: RRR, 3/6 systolic murmur  noted. Thready peripheral pulses, 4+ sec cap refill, dusky red color, no hair, with 2+ edema bilateral.   Abdomen: Soft, + BS.  Non tender, no guarding, rebound, hernias, masses. Lymphatics: Non tender without lymphadenopathy.  Musculoskeletal: Full ROM, 5/5 strength, Antalgic gait. Skin: 2.5 x 1.5 cm wound on right anterior ankle with yellow exudate, no warmth, discharge, very tender to palpation.  Left medial MCP with 5 mm ulcer, with some erythema at MCP, nontender and FROM MCP       Neuro: Cranial nerves intact. No cerebellar symptoms. Sensation intact to touch bil feet, diminished through feet and ankles bil to monofilament Psych: Awake and oriented X 3, normal affect, Insight and Judgment appropriate.    Vicie Mutters, PA-C 1:22 PM [facility]

## 2019-09-12 DIAGNOSIS — J9611 Chronic respiratory failure with hypoxia: Secondary | ICD-10-CM | POA: Diagnosis not present

## 2019-09-12 DIAGNOSIS — E039 Hypothyroidism, unspecified: Secondary | ICD-10-CM | POA: Diagnosis not present

## 2019-09-12 DIAGNOSIS — E871 Hypo-osmolality and hyponatremia: Secondary | ICD-10-CM | POA: Diagnosis not present

## 2019-09-12 DIAGNOSIS — J449 Chronic obstructive pulmonary disease, unspecified: Secondary | ICD-10-CM | POA: Diagnosis not present

## 2019-09-12 DIAGNOSIS — E785 Hyperlipidemia, unspecified: Secondary | ICD-10-CM | POA: Diagnosis not present

## 2019-09-12 DIAGNOSIS — G629 Polyneuropathy, unspecified: Secondary | ICD-10-CM | POA: Diagnosis not present

## 2019-09-12 DIAGNOSIS — S91302D Unspecified open wound, left foot, subsequent encounter: Secondary | ICD-10-CM | POA: Diagnosis not present

## 2019-09-12 DIAGNOSIS — Z9981 Dependence on supplemental oxygen: Secondary | ICD-10-CM | POA: Diagnosis not present

## 2019-09-12 DIAGNOSIS — I2510 Atherosclerotic heart disease of native coronary artery without angina pectoris: Secondary | ICD-10-CM | POA: Diagnosis not present

## 2019-09-12 DIAGNOSIS — E559 Vitamin D deficiency, unspecified: Secondary | ICD-10-CM | POA: Diagnosis not present

## 2019-09-12 DIAGNOSIS — I130 Hypertensive heart and chronic kidney disease with heart failure and stage 1 through stage 4 chronic kidney disease, or unspecified chronic kidney disease: Secondary | ICD-10-CM | POA: Diagnosis not present

## 2019-09-12 DIAGNOSIS — I5042 Chronic combined systolic (congestive) and diastolic (congestive) heart failure: Secondary | ICD-10-CM | POA: Diagnosis not present

## 2019-09-12 DIAGNOSIS — N179 Acute kidney failure, unspecified: Secondary | ICD-10-CM | POA: Diagnosis not present

## 2019-09-12 DIAGNOSIS — I5082 Biventricular heart failure: Secondary | ICD-10-CM | POA: Diagnosis not present

## 2019-09-12 DIAGNOSIS — W19XXXD Unspecified fall, subsequent encounter: Secondary | ICD-10-CM | POA: Diagnosis not present

## 2019-09-12 DIAGNOSIS — G9340 Encephalopathy, unspecified: Secondary | ICD-10-CM | POA: Diagnosis not present

## 2019-09-12 DIAGNOSIS — M0609 Rheumatoid arthritis without rheumatoid factor, multiple sites: Secondary | ICD-10-CM | POA: Diagnosis not present

## 2019-09-12 DIAGNOSIS — I429 Cardiomyopathy, unspecified: Secondary | ICD-10-CM | POA: Diagnosis not present

## 2019-09-12 DIAGNOSIS — I482 Chronic atrial fibrillation, unspecified: Secondary | ICD-10-CM | POA: Diagnosis not present

## 2019-09-12 DIAGNOSIS — S91001D Unspecified open wound, right ankle, subsequent encounter: Secondary | ICD-10-CM | POA: Diagnosis not present

## 2019-09-12 DIAGNOSIS — R7303 Prediabetes: Secondary | ICD-10-CM | POA: Diagnosis not present

## 2019-09-12 DIAGNOSIS — I739 Peripheral vascular disease, unspecified: Secondary | ICD-10-CM | POA: Diagnosis not present

## 2019-09-12 DIAGNOSIS — N1832 Chronic kidney disease, stage 3b: Secondary | ICD-10-CM | POA: Diagnosis not present

## 2019-09-12 DIAGNOSIS — M170 Bilateral primary osteoarthritis of knee: Secondary | ICD-10-CM | POA: Diagnosis not present

## 2019-09-12 DIAGNOSIS — Z9181 History of falling: Secondary | ICD-10-CM | POA: Diagnosis not present

## 2019-09-12 LAB — CBC WITH DIFFERENTIAL/PLATELET
Absolute Monocytes: 931 cells/uL (ref 200–950)
Basophils Absolute: 78 cells/uL (ref 0–200)
Basophils Relative: 0.9 %
Eosinophils Absolute: 339 cells/uL (ref 15–500)
Eosinophils Relative: 3.9 %
HCT: 35.3 % (ref 35.0–45.0)
Hemoglobin: 11.2 g/dL — ABNORMAL LOW (ref 11.7–15.5)
Lymphs Abs: 1427 cells/uL (ref 850–3900)
MCH: 28.1 pg (ref 27.0–33.0)
MCHC: 31.7 g/dL — ABNORMAL LOW (ref 32.0–36.0)
MCV: 88.5 fL (ref 80.0–100.0)
MPV: 10.7 fL (ref 7.5–12.5)
Monocytes Relative: 10.7 %
Neutro Abs: 5925 cells/uL (ref 1500–7800)
Neutrophils Relative %: 68.1 %
Platelets: 261 10*3/uL (ref 140–400)
RBC: 3.99 10*6/uL (ref 3.80–5.10)
RDW: 15.8 % — ABNORMAL HIGH (ref 11.0–15.0)
Total Lymphocyte: 16.4 %
WBC: 8.7 10*3/uL (ref 3.8–10.8)

## 2019-09-12 LAB — COMPLETE METABOLIC PANEL WITH GFR
AG Ratio: 1.8 (calc) (ref 1.0–2.5)
ALT: 14 U/L (ref 6–29)
AST: 15 U/L (ref 10–35)
Albumin: 3.5 g/dL — ABNORMAL LOW (ref 3.6–5.1)
Alkaline phosphatase (APISO): 49 U/L (ref 37–153)
BUN/Creatinine Ratio: 18 (calc) (ref 6–22)
BUN: 21 mg/dL (ref 7–25)
CO2: 26 mmol/L (ref 20–32)
Calcium: 9.2 mg/dL (ref 8.6–10.4)
Chloride: 103 mmol/L (ref 98–110)
Creat: 1.14 mg/dL — ABNORMAL HIGH (ref 0.60–0.93)
GFR, Est African American: 54 mL/min/{1.73_m2} — ABNORMAL LOW (ref >=60)
GFR, Est Non African American: 46 mL/min/{1.73_m2} — ABNORMAL LOW (ref >=60)
Globulin: 2 g/dL (calc) (ref 1.9–3.7)
Glucose, Bld: 90 mg/dL (ref 65–99)
Potassium: 4.3 mmol/L (ref 3.5–5.3)
Sodium: 136 mmol/L (ref 135–146)
Total Bilirubin: 0.7 mg/dL (ref 0.2–1.2)
Total Protein: 5.5 g/dL — ABNORMAL LOW (ref 6.1–8.1)

## 2019-09-12 LAB — MAGNESIUM: Magnesium: 2.1 mg/dL (ref 1.5–2.5)

## 2019-09-12 LAB — URIC ACID: Uric Acid, Serum: 5.3 mg/dL (ref 2.5–7.0)

## 2019-09-13 ENCOUNTER — Telehealth: Payer: Self-pay

## 2019-09-13 DIAGNOSIS — M0609 Rheumatoid arthritis without rheumatoid factor, multiple sites: Secondary | ICD-10-CM | POA: Diagnosis not present

## 2019-09-13 DIAGNOSIS — Z9981 Dependence on supplemental oxygen: Secondary | ICD-10-CM | POA: Diagnosis not present

## 2019-09-13 DIAGNOSIS — R7303 Prediabetes: Secondary | ICD-10-CM | POA: Diagnosis not present

## 2019-09-13 DIAGNOSIS — W19XXXD Unspecified fall, subsequent encounter: Secondary | ICD-10-CM | POA: Diagnosis not present

## 2019-09-13 DIAGNOSIS — S91001D Unspecified open wound, right ankle, subsequent encounter: Secondary | ICD-10-CM | POA: Diagnosis not present

## 2019-09-13 DIAGNOSIS — I482 Chronic atrial fibrillation, unspecified: Secondary | ICD-10-CM | POA: Diagnosis not present

## 2019-09-13 DIAGNOSIS — I2510 Atherosclerotic heart disease of native coronary artery without angina pectoris: Secondary | ICD-10-CM | POA: Diagnosis not present

## 2019-09-13 DIAGNOSIS — J9611 Chronic respiratory failure with hypoxia: Secondary | ICD-10-CM | POA: Diagnosis not present

## 2019-09-13 DIAGNOSIS — M170 Bilateral primary osteoarthritis of knee: Secondary | ICD-10-CM | POA: Diagnosis not present

## 2019-09-13 DIAGNOSIS — I429 Cardiomyopathy, unspecified: Secondary | ICD-10-CM | POA: Diagnosis not present

## 2019-09-13 DIAGNOSIS — E871 Hypo-osmolality and hyponatremia: Secondary | ICD-10-CM | POA: Diagnosis not present

## 2019-09-13 DIAGNOSIS — S91302D Unspecified open wound, left foot, subsequent encounter: Secondary | ICD-10-CM | POA: Diagnosis not present

## 2019-09-13 DIAGNOSIS — G629 Polyneuropathy, unspecified: Secondary | ICD-10-CM | POA: Diagnosis not present

## 2019-09-13 DIAGNOSIS — I739 Peripheral vascular disease, unspecified: Secondary | ICD-10-CM | POA: Diagnosis not present

## 2019-09-13 DIAGNOSIS — E039 Hypothyroidism, unspecified: Secondary | ICD-10-CM | POA: Diagnosis not present

## 2019-09-13 DIAGNOSIS — I130 Hypertensive heart and chronic kidney disease with heart failure and stage 1 through stage 4 chronic kidney disease, or unspecified chronic kidney disease: Secondary | ICD-10-CM | POA: Diagnosis not present

## 2019-09-13 DIAGNOSIS — E559 Vitamin D deficiency, unspecified: Secondary | ICD-10-CM | POA: Diagnosis not present

## 2019-09-13 DIAGNOSIS — N1832 Chronic kidney disease, stage 3b: Secondary | ICD-10-CM | POA: Diagnosis not present

## 2019-09-13 DIAGNOSIS — Z9181 History of falling: Secondary | ICD-10-CM | POA: Diagnosis not present

## 2019-09-13 DIAGNOSIS — G9340 Encephalopathy, unspecified: Secondary | ICD-10-CM | POA: Diagnosis not present

## 2019-09-13 DIAGNOSIS — I5042 Chronic combined systolic (congestive) and diastolic (congestive) heart failure: Secondary | ICD-10-CM | POA: Diagnosis not present

## 2019-09-13 DIAGNOSIS — N179 Acute kidney failure, unspecified: Secondary | ICD-10-CM | POA: Diagnosis not present

## 2019-09-13 DIAGNOSIS — J449 Chronic obstructive pulmonary disease, unspecified: Secondary | ICD-10-CM | POA: Diagnosis not present

## 2019-09-13 DIAGNOSIS — E785 Hyperlipidemia, unspecified: Secondary | ICD-10-CM | POA: Diagnosis not present

## 2019-09-13 DIAGNOSIS — I5082 Biventricular heart failure: Secondary | ICD-10-CM | POA: Diagnosis not present

## 2019-09-13 NOTE — Telephone Encounter (Signed)
Requesting lab results. Please advise.

## 2019-09-14 ENCOUNTER — Other Ambulatory Visit: Payer: Self-pay

## 2019-09-14 ENCOUNTER — Telehealth: Payer: Self-pay | Admitting: *Deleted

## 2019-09-14 ENCOUNTER — Ambulatory Visit (HOSPITAL_COMMUNITY)
Admission: RE | Admit: 2019-09-14 | Discharge: 2019-09-14 | Disposition: A | Discharge status: Home / Self Care | Payer: Medicare Other | Source: Ambulatory Visit | Attending: Physician Assistant | Admitting: Physician Assistant

## 2019-09-14 ENCOUNTER — Other Ambulatory Visit: Payer: Self-pay | Admitting: Internal Medicine

## 2019-09-14 DIAGNOSIS — L89891 Pressure ulcer of other site, stage 1: Secondary | ICD-10-CM | POA: Diagnosis not present

## 2019-09-14 DIAGNOSIS — I739 Peripheral vascular disease, unspecified: Secondary | ICD-10-CM | POA: Diagnosis not present

## 2019-09-14 DIAGNOSIS — J449 Chronic obstructive pulmonary disease, unspecified: Secondary | ICD-10-CM | POA: Diagnosis not present

## 2019-09-14 MED ORDER — DEXAMETHASONE 4 MG PO TABS
ORAL_TABLET | ORAL | 0 refills | Status: DC
Start: 2019-09-14 — End: 2019-09-20

## 2019-09-14 NOTE — Telephone Encounter (Signed)
Patient called and reported left arm stiffness and unable to raise. Dr Melford Aase sent in an RX for Dexamethasone 4 mg taper. Patient is aware.

## 2019-09-18 ENCOUNTER — Other Ambulatory Visit: Payer: Self-pay | Admitting: *Deleted

## 2019-09-18 DIAGNOSIS — Z9181 History of falling: Secondary | ICD-10-CM | POA: Diagnosis not present

## 2019-09-18 DIAGNOSIS — G629 Polyneuropathy, unspecified: Secondary | ICD-10-CM | POA: Diagnosis not present

## 2019-09-18 DIAGNOSIS — N179 Acute kidney failure, unspecified: Secondary | ICD-10-CM | POA: Diagnosis not present

## 2019-09-18 DIAGNOSIS — W19XXXD Unspecified fall, subsequent encounter: Secondary | ICD-10-CM | POA: Diagnosis not present

## 2019-09-18 DIAGNOSIS — I5082 Biventricular heart failure: Secondary | ICD-10-CM | POA: Diagnosis not present

## 2019-09-18 DIAGNOSIS — I482 Chronic atrial fibrillation, unspecified: Secondary | ICD-10-CM | POA: Diagnosis not present

## 2019-09-18 DIAGNOSIS — J449 Chronic obstructive pulmonary disease, unspecified: Secondary | ICD-10-CM | POA: Diagnosis not present

## 2019-09-18 DIAGNOSIS — N1832 Chronic kidney disease, stage 3b: Secondary | ICD-10-CM | POA: Diagnosis not present

## 2019-09-18 DIAGNOSIS — I130 Hypertensive heart and chronic kidney disease with heart failure and stage 1 through stage 4 chronic kidney disease, or unspecified chronic kidney disease: Secondary | ICD-10-CM | POA: Diagnosis not present

## 2019-09-18 DIAGNOSIS — E871 Hypo-osmolality and hyponatremia: Secondary | ICD-10-CM | POA: Diagnosis not present

## 2019-09-18 DIAGNOSIS — I5042 Chronic combined systolic (congestive) and diastolic (congestive) heart failure: Secondary | ICD-10-CM | POA: Diagnosis not present

## 2019-09-18 DIAGNOSIS — M0609 Rheumatoid arthritis without rheumatoid factor, multiple sites: Secondary | ICD-10-CM | POA: Diagnosis not present

## 2019-09-18 DIAGNOSIS — E039 Hypothyroidism, unspecified: Secondary | ICD-10-CM | POA: Diagnosis not present

## 2019-09-18 DIAGNOSIS — I429 Cardiomyopathy, unspecified: Secondary | ICD-10-CM | POA: Diagnosis not present

## 2019-09-18 DIAGNOSIS — S91302D Unspecified open wound, left foot, subsequent encounter: Secondary | ICD-10-CM | POA: Diagnosis not present

## 2019-09-18 DIAGNOSIS — E559 Vitamin D deficiency, unspecified: Secondary | ICD-10-CM | POA: Diagnosis not present

## 2019-09-18 DIAGNOSIS — E785 Hyperlipidemia, unspecified: Secondary | ICD-10-CM | POA: Diagnosis not present

## 2019-09-18 DIAGNOSIS — S91001D Unspecified open wound, right ankle, subsequent encounter: Secondary | ICD-10-CM | POA: Diagnosis not present

## 2019-09-18 DIAGNOSIS — R7303 Prediabetes: Secondary | ICD-10-CM | POA: Diagnosis not present

## 2019-09-18 DIAGNOSIS — M170 Bilateral primary osteoarthritis of knee: Secondary | ICD-10-CM | POA: Diagnosis not present

## 2019-09-18 DIAGNOSIS — G9340 Encephalopathy, unspecified: Secondary | ICD-10-CM | POA: Diagnosis not present

## 2019-09-18 DIAGNOSIS — I739 Peripheral vascular disease, unspecified: Secondary | ICD-10-CM | POA: Diagnosis not present

## 2019-09-18 DIAGNOSIS — J9611 Chronic respiratory failure with hypoxia: Secondary | ICD-10-CM | POA: Diagnosis not present

## 2019-09-18 DIAGNOSIS — Z9981 Dependence on supplemental oxygen: Secondary | ICD-10-CM | POA: Diagnosis not present

## 2019-09-18 DIAGNOSIS — I2510 Atherosclerotic heart disease of native coronary artery without angina pectoris: Secondary | ICD-10-CM | POA: Diagnosis not present

## 2019-09-19 NOTE — Progress Notes (Signed)
FOLLOW UP 3 Month  Assessment and Plan:    Edema of both lower extremities due to peripheral venous insufficiency -     CBC with Differential/Platelet -     COMPLETE METABOLIC PANEL WITH GFR Weight down 9 lbs, patient also trying to lose weight by decreasing carbs Did misunderstand instructions and she has been take 4 total demadex a day- recheck kidney function will cut back to 2 in AM and 1 at night for now pending kidney fx  Chronic combined systolic and diastolic CHF (congestive heart failure) (HCC) Weight down 9 lbs, patient also trying to lose weight by decreasing carbs Did misunderstand instructions and she has been take 4 total demadex a day- recheck kidney function will cut back to 2 in AM and 1 at night for now pending kidney fx -     CBC with Differential/Platelet -     COMPLETE METABOLIC PANEL WITH GFR  Rheumatoid arthritis of multiple sites with negative rheumatoid factor ([place]) Follow up with Rheum  Pressure injury of lower extremity, stage 1 Dressed today, follow up wound clinic but looking better Normal ABI  Chronic bilateral low back pain with sciatica, sciatica laterality unspecified ? Back versus hip- will refer to ortho Negative straight leg- more pain with movement of left hip -     Ambulatory referral to Orthopedic Surgery -     dexamethasone (DECADRON) 0.5 MG tablet; Take 1 tablet PO TID for 3 days, then take 1 tablet PO BID for 3 days, then take 1 tablet PO for 5 days. -     traMADol (ULTRAM) 50 MG tablet; Take 1 tablet (50 mg total) by mouth every 12 (twelve) hours as needed for up to 5 days for moderate pain. 1/2-1 tablet at night for cough  Left hip pain Normal Xray 05 s/p fall however now with pain with internal/exeternal rotation of left hip, some pain with weight bearing/bending Will repeat xray to rule AVN s/p fall or fracture Will refer back to ortho- likely some bursitis -     Ambulatory referral to Orthopedic Surgery -     dexamethasone  (DECADRON) 0.5 MG tablet; Take 1 tablet PO TID for 3 days, then take 1 tablet PO BID for 3 days, then take 1 tablet PO for 5 days. -     traMADol (ULTRAM) 50 MG tablet; Take 1 tablet (50 mg total) by mouth every 12 (twelve) hours as needed for up to 5 days for moderate pain. 1/2-1 tablet at night for cough -     DG HIP UNILAT W OR W/O PELVIS 2-3 VIEWS LEFT; Future  Stage 3b chronic kidney disease -     allopurinol (ZYLOPRIM) 300 MG tablet; Take 0.5 tablets (150 mg total) by mouth daily.     Continue diet and meds as discussed. Further disposition pending results of labs. Discussed med's effects and SE's.   Over 30 minutes of exam, counseling, chart review, and critical decision making was performed.   Future Appointments  Date Time Provider [place]  09/26/2019  2:30 PM [name], MD [facility] None  10/12/2019 10:30 AM Ricard Dillon, MD [facility]  11/22/2019  7:35 AM [facility] DEVICE REMOTES [facility]  11/30/2019 10:30 AM Vicie Mutters, PA-C [facility] None  12/31/2019  9:00 AM Deloria Lair, NP [facility] None  02/21/2020  7:35 AM [facility] DEVICE REMOTES [facility]  03/17/2020  9:00 AM [name], MD [facility] None  06/16/2020 11:15 AM Liane Comber, NP [facility] None    ----------------------------------------------------------------------------------------------------------------------  HPI 79 y.o. female with history of AAA s/p repair, Afib on xarleto, AICD, CHF, (following with Dr. 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 Melvyn Novas), she has RA and following with Dr. Dossie Der presents for follow up for bilateral leg swelling.   She has seen Dr. Junius Roads with [facility] in the past, last seen 04/2019 for shoulder pain, given trigger points. She complains of back pain, worse left hip pain down anterior of her leg. Started last night around 9 pm, severe pain with  squatting/standing. Pain is anterior hip and lateral hip.  No numbness tingling, incontinence.. States she is not sleeping due to this. She is on lyrica 25 mg 3 x a day.  Had Xray 08/13/2019 of pelvis  She was admitted to hospital and then transferred to [facility] 06/05 for electrolyte abnormality, dig toxity and her demadex 20 mg was reduced from 4 a day to 1 a day after the hospital visit. She presented with bilateral leg swelling/weeping and possible infection on 06/23 and her demadex was increased to 2 in the AM and she was started on keflex.   She continued to have swelling last visit on 06/29 so her demadex was increase to 2 in the AM and 1 at night. She is down 9 lbs.  Wt Readings from Last 3 Encounters:  09/20/19 170 lb (77.1 kg)  09/11/19 179 lb (81.2 kg)  09/05/19 180 lb 3.2 oz (81.7 kg)     She wears oxygen at night, denies worse SOB, cough, orthopenia or PND. She is on potassium once a day.   She fell on a concrete slab going up the stairs 05/25, she still has bilateral wounds on her feet. Has been referred to wound clinic. She had a normal ABI. Having home health nurse, [place], come out to dress it. Completed ABX   Her blood pressure has been controlled at home, today their BP is BP: 128/88  Lab Results  Component Value Date   CREATININE 1.14 (H) 09/11/2019   BUN 21 09/11/2019   NA 136 09/11/2019   K 4.3 09/11/2019   CL 103 09/11/2019   CO2 26 09/11/2019    She follows with nephrology Lab Results  Component Value Date   GFRNONAA 46 (L) 09/11/2019    Current Medications:   Current Outpatient Medications (Endocrine & Metabolic):  .  levothyroxine (SYNTHROID) 125 MCG tablet, Take 1 tablet (125 mcg total) by mouth daily before breakfast. .  dexamethasone (DECADRON) 0.5 MG tablet, Take 1 tablet PO TID for 3 days, then take 1 tablet PO BID for 3 days, then take 1 tablet PO for 5 days.  Current Outpatient Medications (Cardiovascular):  .  pravastatin (PRAVACHOL) 40 MG  tablet, Take 20 mg by mouth at bedtime. .  torsemide (DEMADEX) 20 MG tablet, Take 2 tablets (40 mg total) by mouth 2 (two) times daily.  Current Outpatient Medications (Respiratory):  .  albuterol (PROAIR HFA) 108 (90 Base) MCG/ACT inhaler, Use 2 Inhalations 15 minutes Apart every 4 hours to Rescue Asthma Attack (Patient taking differently: Inhale 2 puffs into the lungs See admin instructions. Use 2 inhalations 15 minutes apart every 4 hours as needed for asthma attack) .  budesonide-formoterol (SYMBICORT) 80-4.5 MCG/ACT inhaler, Use 2 inhalations 30 minutes apart 2 x /day (every 12 hours) .  cetirizine (ZYRTEC) 10 MG tablet, Take 10 mg by mouth daily as needed for allergies.  .  Guaifenesin (MUCINEX MAXIMUM STRENGTH) 1200 MG TB12, Take 1,200 mg by  mouth 2 (two) times daily as needed (cough/congestion). Marland Kitchen  ipratropium (ATROVENT) 0.06 % nasal spray, Use 1 to 2 sprays each Nostril 2 to 3 x / day as needed (Patient taking differently: Place 1-2 sprays into both nostrils 3 (three) times daily as needed for rhinitis (congestion). ) .  ipratropium-albuterol (DUONEB) 0.5-2.5 (3) MG/3ML SOLN, Inhale 3 mLs into the lungs every 6 (six) hours as needed (for shortness of breath or wheezing). .  montelukast (SINGULAIR) 10 MG tablet, TAKE 1 TABLET DAILY FOR ALLERGIES & ASTHMA (Patient taking differently: Take 10 mg by mouth daily. for Allergies & Asthma)  Current Outpatient Medications (Analgesics):  .  allopurinol (ZYLOPRIM) 300 MG tablet, Take 0.5 tablets (150 mg total) by mouth daily. Marland Kitchen  aspirin EC 81 MG tablet, Take 81 mg by mouth daily. Marland Kitchen  leflunomide (ARAVA) 20 MG tablet, Take 10 mg by mouth daily.  .  traMADol (ULTRAM) 50 MG tablet, Take 1 tablet (50 mg total) by mouth every 12 (twelve) hours as needed for up to 5 days for moderate pain. 1/2-1 tablet at night for cough  Current Outpatient Medications (Hematological):  Marland Kitchen  Rivaroxaban (XARELTO) 15 MG TABS tablet, Take 1 tablet Daily for Afib &  to Prevent  Blood Clots (Patient taking differently: Take 15 mg by mouth daily. for Afib &  to Prevent Blood Clots)  Current Outpatient Medications (Other):  Marland Kitchen  Eyelid Cleansers (AVENOVA) 0.01 % SOLN, Apply 1 application topically See admin instructions. Wash eyelids twice daily with cleaner .  famotidine (PEPCID) 40 MG tablet, Take 1 tablet (40 mg total) by mouth every evening. Marland Kitchen  L-Lysine 500 MG TABS, Take 500 mg by mouth daily.  .  Olopatadine HCl 0.2 % SOLN, Place 1 drop into both eyes daily as needed (dry eyes).  .  OXYGEN, Inhale 2 L into the lungs continuous. [name] Glycol-Propyl Glycol (SYSTANE) 0.4-0.3 % SOLN, Place 1 drop into both eyes 4 (four) times daily as needed (for dry eyes).  .  potassium chloride SA (KLOR-CON) 20 MEQ tablet, Take 20 mEq by mouth daily.  .  pregabalin (LYRICA) 25 MG capsule, Take 1 capsule 2 to 3 x /day as needed for Neuropathy pain   Allergies:  Allergies  Allergen Reactions  . Amiodarone Other (See Comments)    PULMONARY TOXICITY  . Diovan [Valsartan] Other (See Comments)    HYPOTENSION  . Doxycycline Diarrhea and Other (See Comments)    VISUAL DISTURBANCE  . Flexeril [Cyclobenzaprine] Other (See Comments)    FATIGUE  . Keflex [Cephalexin] Diarrhea  . Verapamil Other (See Comments)    EDEMA  . Codeine Hives     Medical History:  Past Medical History:  Diagnosis Date  . AAA (abdominal aortic aneurysm) ([place])   . AICD (automatic cardioverter/defibrillator) present 11/10/2017  . Arthritis    "some in my knees" (11/10/2017)  . Atrial fibrillation ([place])   . CHF (congestive heart failure) ([place])   . Chronic bronchitis ([place])   . COPD (chronic obstructive pulmonary disease) ([place])   . Depression   . GERD (gastroesophageal reflux disease)   . Gout    "daily RX" (11/10/2017)  . History of hiatal hernia   . Hyperlipidemia   . Hypertension   . Hypothyroid   . Migraine headache    "hx; none since 1980s" (11/10/2017)  . Osteopenia   . Pneumonia    "~ 3  times" (11/10/2017)  . PVD (peripheral vascular disease) ([place])    Family history- Reviewed and  unchanged Social history- Reviewed and unchanged  Review of Systems:  Review of Systems  Constitutional: Negative for chills, diaphoresis, fever, malaise/fatigue and weight loss.  HENT: Negative for congestion, ear discharge, ear pain, hearing loss, nosebleeds, sinus pain, sore throat and tinnitus.   Eyes: Negative for blurred vision, double vision, pain, discharge and redness.  Respiratory: Negative for cough, hemoptysis, sputum production, shortness of breath and wheezing.   Cardiovascular: Positive for leg swelling. Negative for chest pain, palpitations, orthopnea, claudication and PND.       Mild but resolves over night.  Gastrointestinal: Positive for nausea. Negative for abdominal pain, blood in stool, constipation, diarrhea, heartburn, melena and vomiting.  Genitourinary: Negative for dysuria, flank pain, frequency, hematuria and urgency.  Musculoskeletal: Positive for back pain and joint pain. Negative for falls, myalgias and neck pain.       Left ankle pain  Skin: Negative for itching and rash.       Top of left foot  Neurological: Positive for tingling (bil feet) and sensory change (bil feet and ankles neuropathic pain worse). Negative for dizziness, tremors, seizures, loss of consciousness, weakness and headaches.  Endo/Heme/Allergies: Positive for environmental allergies. Negative for polydipsia. Does not bruise/bleed easily.  Psychiatric/Behavioral: Negative for depression, memory loss and suicidal ideas. The patient is not nervous/anxious and does not have insomnia.       Physical Exam: BP 128/88   Pulse 72   Temp (!) 97.3 F (36.3 C)   Wt 170 lb (77.1 kg)   SpO2 99%   BMI 29.64 kg/m  Wt Readings from Last 3 Encounters:  09/20/19 170 lb (77.1 kg)  09/11/19 179 lb (81.2 kg)  09/05/19 180 lb 3.2 oz (81.7 kg)   General Appearance: Well nourished, in no apparent  distress. Eyes: PERRLA, EOMs, conjunctiva no swelling or erythema Sinuses: No Frontal/maxillary tenderness ENT/Mouth: Ext aud canals clear, TMs without erythema, bulging. Right TM, serous noted behind TM. No erythema, swelling, or exudate on post pharynx.  Tonsils not swollen or erythematous. Hearing normal.  Neck: Supple, thyroid normal.  Respiratory: Respiratory effort normal, BS equal bilaterally without rales, rhonchi, wheezing or stridor.  Cardio: RRR, 3/6 systolic murmur noted. Thready peripheral pulses, 4+ sec cap refill, dusky red color, no hair, with 2+ edema bilateral.   Abdomen: Soft, + BS.  Non tender, no guarding, rebound, hernias, masses. Lymphatics: Non tender without lymphadenopathy.  Musculoskeletal: negative straight leg raise, decrease strength left leg due to pain, pain with internal and external rotation of left hip,decreased ROM and tenderness over greater trochanter    Patient in wheel chair due to pain with ambulation.  Skin: 2.5 x 1.5 cm wound on right anterior ankle with pink granulation tissue no discharge, no warmth, discharge, very tender to palpation.  Left medial MCP with 4 mm ulcer, with some erythema at MCP, nontender and FROM MCP  Neuro: Cranial nerves intact. No cerebellar symptoms. Sensation intact to touch bil feet, diminished through feet and ankles bil to monofilament Psych: Awake and oriented X 3, normal affect, Insight and Judgment appropriate.         Vicie Mutters, PA-C 12:53 PM [facility]

## 2019-09-20 ENCOUNTER — Other Ambulatory Visit: Payer: Self-pay

## 2019-09-20 ENCOUNTER — Ambulatory Visit (INDEPENDENT_AMBULATORY_CARE_PROVIDER_SITE_OTHER): Payer: Medicare Other | Admitting: Physician Assistant

## 2019-09-20 ENCOUNTER — Encounter: Payer: Self-pay | Admitting: Physician Assistant

## 2019-09-20 ENCOUNTER — Ambulatory Visit
Admission: RE | Admit: 2019-09-20 | Discharge: 2019-09-20 | Disposition: A | Discharge status: Home / Self Care | Payer: Medicare Other | Source: Ambulatory Visit | Attending: Physician Assistant | Admitting: Physician Assistant

## 2019-09-20 VITALS — BP 128/88 | HR 72 | Temp 97.3°F | Wt 170.0 lb

## 2019-09-20 DIAGNOSIS — I5042 Chronic combined systolic (congestive) and diastolic (congestive) heart failure: Secondary | ICD-10-CM | POA: Diagnosis not present

## 2019-09-20 DIAGNOSIS — G8929 Other chronic pain: Secondary | ICD-10-CM

## 2019-09-20 DIAGNOSIS — M1612 Unilateral primary osteoarthritis, left hip: Secondary | ICD-10-CM | POA: Diagnosis not present

## 2019-09-20 DIAGNOSIS — L89891 Pressure ulcer of other site, stage 1: Secondary | ICD-10-CM | POA: Diagnosis not present

## 2019-09-20 DIAGNOSIS — M0609 Rheumatoid arthritis without rheumatoid factor, multiple sites: Secondary | ICD-10-CM | POA: Diagnosis not present

## 2019-09-20 DIAGNOSIS — M5440 Lumbago with sciatica, unspecified side: Secondary | ICD-10-CM | POA: Diagnosis not present

## 2019-09-20 DIAGNOSIS — N1832 Chronic kidney disease, stage 3b: Secondary | ICD-10-CM

## 2019-09-20 DIAGNOSIS — I872 Venous insufficiency (chronic) (peripheral): Secondary | ICD-10-CM | POA: Diagnosis not present

## 2019-09-20 DIAGNOSIS — M25552 Pain in left hip: Secondary | ICD-10-CM

## 2019-09-20 LAB — COMPLETE METABOLIC PANEL WITH GFR
AG Ratio: 1.9 (calc) (ref 1.0–2.5)
ALT: 41 U/L — ABNORMAL HIGH (ref 6–29)
AST: 15 U/L (ref 10–35)
Albumin: 3.7 g/dL (ref 3.6–5.1)
Alkaline phosphatase (APISO): 44 U/L (ref 37–153)
BUN/Creatinine Ratio: 38 (calc) — ABNORMAL HIGH (ref 6–22)
BUN: 51 mg/dL — ABNORMAL HIGH (ref 7–25)
CO2: 28 mmol/L (ref 20–32)
Calcium: 9.2 mg/dL (ref 8.6–10.4)
Chloride: 100 mmol/L (ref 98–110)
Creat: 1.33 mg/dL — ABNORMAL HIGH (ref 0.60–0.93)
GFR, Est African American: 45 mL/min/{1.73_m2} — ABNORMAL LOW (ref >=60)
GFR, Est Non African American: 38 mL/min/{1.73_m2} — ABNORMAL LOW (ref >=60)
Globulin: 1.9 g/dL (calc) (ref 1.9–3.7)
Glucose, Bld: 108 mg/dL — ABNORMAL HIGH (ref 65–99)
Potassium: 4.8 mmol/L (ref 3.5–5.3)
Sodium: 139 mmol/L (ref 135–146)
Total Bilirubin: 1.7 mg/dL — ABNORMAL HIGH (ref 0.2–1.2)
Total Protein: 5.6 g/dL — ABNORMAL LOW (ref 6.1–8.1)

## 2019-09-20 LAB — CBC WITH DIFFERENTIAL/PLATELET
Absolute Monocytes: 1270 cells/uL — ABNORMAL HIGH (ref 200–950)
Basophils Absolute: 18 cells/uL (ref 0–200)
Basophils Relative: 0.1 %
Eosinophils Absolute: 37 cells/uL (ref 15–500)
Eosinophils Relative: 0.2 %
HCT: 35.6 % (ref 35.0–45.0)
Hemoglobin: 11.1 g/dL — ABNORMAL LOW (ref 11.7–15.5)
Lymphs Abs: 1178 cells/uL (ref 850–3900)
MCH: 28.1 pg (ref 27.0–33.0)
MCHC: 31.2 g/dL — ABNORMAL LOW (ref 32.0–36.0)
MCV: 90.1 fL (ref 80.0–100.0)
MPV: 10.2 fL (ref 7.5–12.5)
Monocytes Relative: 6.9 %
Neutro Abs: 15898 cells/uL — ABNORMAL HIGH (ref 1500–7800)
Neutrophils Relative %: 86.4 %
Platelets: 243 10*3/uL (ref 140–400)
RBC: 3.95 10*6/uL (ref 3.80–5.10)
RDW: 15.9 % — ABNORMAL HIGH (ref 11.0–15.0)
Total Lymphocyte: 6.4 %
WBC: 18.4 10*3/uL — ABNORMAL HIGH (ref 3.8–10.8)

## 2019-09-20 MED ORDER — TRAMADOL HCL 50 MG PO TABS: 50.0000 mg | ORAL_TABLET | Freq: Two times a day (BID) | ORAL | 0 refills | Status: DC | PRN

## 2019-09-20 MED ORDER — ALLOPURINOL 300 MG PO TABS: 150.0000 mg | ORAL_TABLET | Freq: Every day | ORAL | 1 refills | Status: AC

## 2019-09-20 MED ORDER — DEXAMETHASONE 0.5 MG PO TABS: ORAL_TABLET | ORAL | 0 refills | Status: DC

## 2019-09-20 MED ORDER — TRAMADOL HCL 50 MG PO TABS: 50.0000 mg | ORAL_TABLET | Freq: Two times a day (BID) | ORAL | 0 refills | Status: AC | PRN

## 2019-09-20 NOTE — Patient Outreach (Signed)
[facility]) Care Management  09/20/2019  Katie [name] 1940-03-20 [id number]  Incoming call from pt with concerns that she has not heard from the wound center for her referral and evaluation.   Advised would call to see if she has been scheduled.  When I did call the wound center they did verify that pt does have an appt on 727/21.  When I called her back she reported the wound center had called her about the appt and that they have put her on the waiting list.  Pt to call me with new issues if necessary. Next appt is 12/31/19.  [name]. [name], MSN, [facility] Gerontological Nurse Practitioner [facility] Care Management [phone]

## 2019-09-20 NOTE — Patient Instructions (Addendum)
Depending on your kidney function I may adjust the toresmide more But for now go down to 2 in the morning and 1 in the afternoon Keep an eye on your weight- weight daily Cut back to 1 a day if any dizziness or weakness or confusion  Cut the allopurinol to 1/2 pill a day   I think the pain is your hip or your lower back Take the tramadol as needed and start on the decadron pills  Go to the ER if you have any new weakness in your legs, have trouble controlling your urine or bowels, or have worsening pain.   INFORMATION ABOUT YOUR [facility] Can walk into [address] building for an Insurance account manager. They will have the order and take you back. You do not any paper work, I should get the result back today or tomorrow. This order is good for a year.  Can call [phone] to schedule an appointment if you wish.    Follow up with orthopedic! We are sending you to Dr. Pleas Patricia but if they can not get you in there are  are two walk in or emergency clinics in [place]:  You can walk in Saturday 9-2 or Sunday 10-2 With [address] [phone]  Or there is a day time walk in clinic with [facility] Hours are 8-5pm Monday-Friday Address: [address]  Number is [phone]

## 2019-09-20 NOTE — Addendum Note (Signed)
Addended by: Vicie Mutters R on: 09/20/2019 04:25 PM   Modules accepted: Orders

## 2019-09-21 ENCOUNTER — Other Ambulatory Visit: Payer: Self-pay | Admitting: Internal Medicine

## 2019-09-21 ENCOUNTER — Other Ambulatory Visit: Payer: Self-pay | Admitting: Adult Health

## 2019-09-21 DIAGNOSIS — E782 Mixed hyperlipidemia: Secondary | ICD-10-CM

## 2019-09-21 NOTE — Progress Notes (Signed)
Spoke with patient. She is aware and understands all concerns and medication directions. She denies any symptoms (fever, chills, dizziness, etc) at this time but agrees that if that changes over the weekend, she will go to the ER.  -Marcelino Scot

## 2019-09-24 DIAGNOSIS — S91302A Unspecified open wound, left foot, initial encounter: Secondary | ICD-10-CM | POA: Diagnosis not present

## 2019-09-25 ENCOUNTER — Other Ambulatory Visit: Payer: Self-pay

## 2019-09-25 ENCOUNTER — Encounter: Payer: Self-pay | Admitting: Family Medicine

## 2019-09-25 ENCOUNTER — Ambulatory Visit (INDEPENDENT_AMBULATORY_CARE_PROVIDER_SITE_OTHER): Payer: Medicare Other | Admitting: Family Medicine

## 2019-09-25 DIAGNOSIS — Z9981 Dependence on supplemental oxygen: Secondary | ICD-10-CM | POA: Diagnosis not present

## 2019-09-25 DIAGNOSIS — S91302D Unspecified open wound, left foot, subsequent encounter: Secondary | ICD-10-CM | POA: Diagnosis not present

## 2019-09-25 DIAGNOSIS — E039 Hypothyroidism, unspecified: Secondary | ICD-10-CM | POA: Diagnosis not present

## 2019-09-25 DIAGNOSIS — I739 Peripheral vascular disease, unspecified: Secondary | ICD-10-CM | POA: Diagnosis not present

## 2019-09-25 DIAGNOSIS — S91001D Unspecified open wound, right ankle, subsequent encounter: Secondary | ICD-10-CM | POA: Diagnosis not present

## 2019-09-25 DIAGNOSIS — W19XXXD Unspecified fall, subsequent encounter: Secondary | ICD-10-CM | POA: Diagnosis not present

## 2019-09-25 DIAGNOSIS — I130 Hypertensive heart and chronic kidney disease with heart failure and stage 1 through stage 4 chronic kidney disease, or unspecified chronic kidney disease: Secondary | ICD-10-CM | POA: Diagnosis not present

## 2019-09-25 DIAGNOSIS — I5042 Chronic combined systolic (congestive) and diastolic (congestive) heart failure: Secondary | ICD-10-CM | POA: Diagnosis not present

## 2019-09-25 DIAGNOSIS — I5082 Biventricular heart failure: Secondary | ICD-10-CM | POA: Diagnosis not present

## 2019-09-25 DIAGNOSIS — M0609 Rheumatoid arthritis without rheumatoid factor, multiple sites: Secondary | ICD-10-CM | POA: Diagnosis not present

## 2019-09-25 DIAGNOSIS — M25552 Pain in left hip: Secondary | ICD-10-CM

## 2019-09-25 DIAGNOSIS — G9340 Encephalopathy, unspecified: Secondary | ICD-10-CM | POA: Diagnosis not present

## 2019-09-25 DIAGNOSIS — M170 Bilateral primary osteoarthritis of knee: Secondary | ICD-10-CM | POA: Diagnosis not present

## 2019-09-25 DIAGNOSIS — G629 Polyneuropathy, unspecified: Secondary | ICD-10-CM | POA: Diagnosis not present

## 2019-09-25 DIAGNOSIS — E559 Vitamin D deficiency, unspecified: Secondary | ICD-10-CM | POA: Diagnosis not present

## 2019-09-25 DIAGNOSIS — N1832 Chronic kidney disease, stage 3b: Secondary | ICD-10-CM | POA: Diagnosis not present

## 2019-09-25 DIAGNOSIS — I482 Chronic atrial fibrillation, unspecified: Secondary | ICD-10-CM | POA: Diagnosis not present

## 2019-09-25 DIAGNOSIS — N179 Acute kidney failure, unspecified: Secondary | ICD-10-CM | POA: Diagnosis not present

## 2019-09-25 DIAGNOSIS — E871 Hypo-osmolality and hyponatremia: Secondary | ICD-10-CM | POA: Diagnosis not present

## 2019-09-25 DIAGNOSIS — J449 Chronic obstructive pulmonary disease, unspecified: Secondary | ICD-10-CM | POA: Diagnosis not present

## 2019-09-25 DIAGNOSIS — J9611 Chronic respiratory failure with hypoxia: Secondary | ICD-10-CM | POA: Diagnosis not present

## 2019-09-25 DIAGNOSIS — Z9181 History of falling: Secondary | ICD-10-CM | POA: Diagnosis not present

## 2019-09-25 DIAGNOSIS — I429 Cardiomyopathy, unspecified: Secondary | ICD-10-CM | POA: Diagnosis not present

## 2019-09-25 DIAGNOSIS — I2510 Atherosclerotic heart disease of native coronary artery without angina pectoris: Secondary | ICD-10-CM | POA: Diagnosis not present

## 2019-09-25 DIAGNOSIS — R7303 Prediabetes: Secondary | ICD-10-CM | POA: Diagnosis not present

## 2019-09-25 DIAGNOSIS — E785 Hyperlipidemia, unspecified: Secondary | ICD-10-CM | POA: Diagnosis not present

## 2019-09-25 NOTE — Progress Notes (Signed)
History of Present Illness:      This nice 79 yo MWF with HTN, HLD, ASHD/cAfib, chCHF with an BiVent AICD, CKD3,Prediabetes, Hypothyroidism, Gout , Rheumatoid Arthritisand Vitamin D Deficiencyreturns  For f/u and monitoring of renal functions compromised by diuresis of her combined dependent edema & chronic Bi ventricular Heart Failure.  I week ago , her BUN/Creat  (51 / 1.33)  & GFR 38 were significantly decreased over the week before & she was advised to decrease her torsemide 20 mg tabs from 4 tabs down to 3 tabs /day and her weight is up 8# over the last week. She denies any orthopnea / PND or worsening of exertional dyspnea. Also at last visit her WBC was significantly elevated at 18,400 and was speculated consequent of a Decadron taper. She does admit sx's of dysuria.   Wt Readings from Last 3 Encounters:  09/26/19 178 lb 6.4 oz (80.9 kg)  09/20/19 170 lb (77.1 kg)  09/11/19 179 lb (81.2 kg)   Medications  Current Outpatient Medications (Endocrine & Metabolic):  .  dexamethasone (DECADRON) 0.5 MG tablet, Take 1 tablet PO TID for 3 days, then take 1 tablet PO BID for 3 days, then take 1 tablet PO for 5 days. Marland Kitchen  levothyroxine (SYNTHROID) 125 MCG tablet, Take 1 tablet (125 mcg total) by mouth daily before breakfast.  Current Outpatient Medications (Cardiovascular):  .  pravastatin (PRAVACHOL) 20 MG tablet, Take 1 tablet at Bedtime for Cholesterol .  torsemide (DEMADEX) 20 MG tablet, Take 2 tablets (40 mg total) by mouth 2 (two) times daily.  Current Outpatient Medications (Respiratory):  .  albuterol (PROAIR HFA) 108 (90 Base) MCG/ACT inhaler, Use 2 Inhalations 15 minutes Apart every 4 hours to Rescue Asthma Attack (Patient taking differently: Inhale 2 puffs into the lungs See admin instructions. Use 2 inhalations 15 minutes apart every 4 hours as needed for asthma attack) .  budesonide-formoterol (SYMBICORT) 80-4.5 MCG/ACT inhaler, Use 2 inhalations 30 minutes apart 2 x /day (every  12 hours) .  cetirizine (ZYRTEC) 10 MG tablet, Take 10 mg by mouth daily as needed for allergies.  .  Guaifenesin (MUCINEX MAXIMUM STRENGTH) 1200 MG TB12, Take 1,200 mg by mouth 2 (two) times daily as needed (cough/congestion). Marland Kitchen  ipratropium (ATROVENT) 0.06 % nasal spray, Use 1 to 2 sprays each Nostril 2 to 3 x / day as needed (Patient taking differently: Place 1-2 sprays into both nostrils 3 (three) times daily as needed for rhinitis (congestion). ) .  ipratropium-albuterol (DUONEB) 0.5-2.5 (3) MG/3ML SOLN, Inhale 3 mLs into the lungs every 6 (six) hours as needed (for shortness of breath or wheezing). .  montelukast (SINGULAIR) 10 MG tablet, TAKE 1 TABLET DAILY FOR ALLERGIES & ASTHMA (Patient taking differently: Take 10 mg by mouth daily. for Allergies & Asthma)  Current Outpatient Medications (Analgesics):  .  allopurinol (ZYLOPRIM) 300 MG tablet, Take 0.5 tablets (150 mg total) by mouth daily. Marland Kitchen  aspirin EC 81 MG tablet, Take 81 mg by mouth daily. Marland Kitchen  leflunomide (ARAVA) 20 MG tablet, Take 10 mg by mouth daily.   Current Outpatient Medications (Hematological):  Marland Kitchen  Rivaroxaban (XARELTO) 15 MG TABS tablet, Take 1 tablet Daily for Afib &  to Prevent Blood Clots (Patient taking differently: Take 15 mg by mouth daily. for Afib &  to Prevent Blood Clots)  Current Outpatient Medications (Other):  Marland Kitchen  Eyelid Cleansers (AVENOVA) 0.01 % SOLN, Apply 1 application topically See admin instructions. Wash eyelids twice daily with cleaner .  L-Lysine 500 MG TABS, Take 500 mg by mouth daily.  .  Olopatadine HCl 0.2 % SOLN, Place 1 drop into both eyes daily as needed (dry eyes).  .  OXYGEN, Inhale 2 L into the lungs continuous. [name] Glycol-Propyl Glycol (SYSTANE) 0.4-0.3 % SOLN, Place 1 drop into both eyes 4 (four) times daily as needed (for dry eyes).  .  potassium chloride SA (KLOR-CON) 20 MEQ tablet, Take 20 mEq by mouth daily.  .  pregabalin (LYRICA) 25 MG capsule, Take 1 capsule 2 to 3 x /day as  needed for Neuropathy pain .  famotidine (PEPCID) 40 MG tablet, Take 1 tablet (40 mg total) by mouth every evening. (Patient not taking: Reported on 09/26/2019)  Problem list She has Essential hypertension; Atherosclerosis of native coronary artery of native heart without angina pectoris; Atrial fibrillation ([place]); Chronic combined systolic and diastolic CHF (congestive heart failure) ([place]); Chronic obstructive pulmonary disease with hypoxia ([place]); Gastroesophageal reflux disease without esophagitis; Osteopenia; Hypothyroidism; Vitamin D deficiency; Long term current use of anticoagulant therapy; CKD (chronic kidney disease) stage 3, GFR 30-59 ml/min; Hyperlipidemia, mixed; Pulmonary Fibrosis sequellae of Amiodarone; PVD (peripheral vascular disease) with claudication ([place]); AAA (abdominal aortic aneurysm) without rupture ([place]); Abnormal glucose; Gout; PAH (pulmonary artery hypertension) ([place]); Aortic atherosclerosis (HCC); COPD (chronic obstructive pulmonary disease) with chronic bronchitis ([place]); 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; Mononeuropathy; Senile purpura ([place]); Rheumatoid arthritis involving multiple sites [facility]); Obesity (BMI 30.0-34.9); and Acute encephalopathy on their problem list.   Observations/Objective: BP 114/62   Pulse 70   Temp (!) 97.2 F (36.2 C)   Resp 16   Ht 5' 3.5" (1.613 m)   Wt 178 lb 6.4 oz (80.9 kg)   SpO2 91%   BMI 31.11 kg/m   HEENT - WNL. Neck - supple. No JVD Chest - Clear equal BS. Cor - NSoft HS w/sl ireg R&R.  Legs wrapped to the knees. PP obscured by compression wraps.   2 (+) pretibial & ankle edema.  MS- FROM w/o deformities.  Gait Nl. Neuro -  Nl w/o focal abnormalities.  Assessment and Plan:  1. Essential hypertension  - CBC with Differential/Platelet - COMPLETE METABOLIC PANEL WITH GFR  2. Chronic combined systolic and diastolic CHF (congestive heart failure) (HCC)  - COMPLETE METABOLIC PANEL WITH GFR  3.  Stage 3b chronic kidney disease  - COMPLETE METABOLIC PANEL WITH GFR  4. Edema of both lower extremities due to peripheral venous insufficiency   5. Leukocytosis  - CBC with Differential/Platelet - Urinalysis, Routine w reflex microscopic - Urine Culture  6. Chronic atrial fibrillation (HCC)  7. Medication management  - CBC with Differential/Platelet - COMPLETE METABOLIC PANEL WITH GFR - Urinalysis, Routine w reflex microscopic - Urine Culture       I discussed the assessment and treatment plan with the patient. The patient was provided an opportunity to ask questions and all were answered. The patient agreed with the plan and demonstrated an understanding of the instructions.       The patient was advised to call back or seek an in-person evaluation if the symptoms worsen or if the condition fails to improve as anticipated.   Kirtland Bouchard, MD

## 2019-09-25 NOTE — Progress Notes (Signed)
Office Visit Note   Patient: Katie [name]           Date of Birth: 02/16/1941           MRN: [id number] Visit Date: 09/25/2019 Requested by: Vicie Mutters, PA-C [address] [place],  [place] [address] PCP: [name], MD  Subjective: Chief Complaint  Patient presents with  . Lower Back - Pain    Left-sided sciatica flared up again 4 days ago. No injury/incident she can recall. Also has pain in the left groin area.    HPI: She is here with left-sided sciatica symptoms.  Onset about 4 days ago, no injury.  Her PCP gave her a steroid taper and she has noticed substantial improvement in her pain since she scheduled this visit.  She feels like the pain is on its way out.  It is not as severe as when she had sciatica last year.  We were talking about referring her for epidural injection but eventually that pain went away as well.  Denies any bowel or bladder dysfunction.  She is not having any significant groin pain.               ROS:   All other systems were reviewed and are negative.  Objective: Vital Signs: There were no vitals taken for this visit.  Physical Exam:  General:  Alert and oriented, in no acute distress. Pulm:  Breathing unlabored. Psy:  Normal mood, congruent affect. Skin: No visible rash Low back: She has some tenderness in the sciatic notch area.  She is also tender over the greater trochanter.  She has some stiffness in her left hip but no significant pain with passive internal rotation.  Straight leg raise is negative, lower extremity strength and reflexes are normal.  Imaging: Recent hip x-rays show mild to moderate DJD.   Assessment & Plan: 1.  Left-sided sciatica with left hip greater trochanter syndrome, clinically improving. -She will finish her steroid Dosepak.  If the pain worsens again, we will get her in for a greater trochanter injection.  Otherwise I will see her back as needed.     Procedures: No procedures performed    No notes on file     PMFS History: Patient Active Problem List   Diagnosis Date Noted  . Acute encephalopathy 08/12/2019  . Senile purpura ([place]) 05/30/2019  . Rheumatoid arthritis involving multiple sites ([facility]) 05/30/2019  . Obesity (BMI 30.0-34.9) 05/30/2019  . Mononeuropathy 11/23/2018  . 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 COPD (chronic obstructive pulmonary disease) with chronic bronchitis ([place]) 02/02/2018  . Aortic atherosclerosis ([place]) 10/17/2017  . PAH (pulmonary artery hypertension) ([facility]) 06/23/2017  . Gout 03/20/2017  . Abnormal glucose 09/19/2015  . AAA (abdominal aortic aneurysm) without rupture ([place] [facility]) 04/22/2015  . PVD (peripheral vascular disease) with claudication ([facility]) 10/16/2013  . CKD (chronic kidney disease) stage 3, GFR 30-59 ml/min 06/08/2013  . Hyperlipidemia, mixed 06/08/2013  . 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 Long term current use of anticoagulant therapy 04/23/2013  . Vitamin D deficiency 02/15/2013  . Hypothyroidism   . Osteopenia   . Chronic combined systolic and diastolic CHF (congestive heart failure) ([facility]) 11/25/2008  . Essential hypertension 11/22/2008  . Atherosclerosis of native coronary artery of native heart without angina pectoris 11/22/2008  . Atrial fibrillation ([facility]) 11/22/2008  . Chronic obstructive pulmonary disease with hypoxia ([facility]) 11/22/2008  . Gastroesophageal reflux disease without esophagitis 11/22/2008   Past Medical History:  Diagnosis Date  . AAA (abdominal aortic aneurysm) ([facility])   . AICD (automatic cardioverter/defibrillator) present 11/10/2017  . Arthritis    "some in my knees" (11/10/2017)  . Atrial fibrillation ([facility])   . CHF (congestive heart failure) ([facility])   . Chronic bronchitis ([facility])   . COPD (chronic obstructive pulmonary disease) ([place])   . Depression   . GERD (gastroesophageal reflux disease)   . Gout    "daily RX" (11/10/2017)  . History of  hiatal hernia   . Hyperlipidemia   . Hypertension   . Hypothyroid   . Migraine headache    "hx; none since 1980s" (11/10/2017)  . Osteopenia   . Pneumonia    "~ 3 times" (11/10/2017)  . PVD (peripheral vascular disease) ([facility])     Family History  Problem Relation Age of Onset  . Cirrhosis Mother   . Cancer Mother 63       PANCREAS  . Heart defect Sister   . Breast cancer Sister        age 43  . Heart disease Sister   . Stroke Sister   . Alcohol abuse Father   . Depression Father   . Hypertension Brother   . Hyperlipidemia Son   . Heart disease Daughter     Past Surgical History:  Procedure Laterality Date  . ABDOMINAL AORTIC ENDOVASCULAR STENT GRAFT N/A 04/11/2013   Procedure: ABDOMINAL AORTIC ENDOVASCULAR STENT GRAFT WITH RIGHT FEMORAL PATCH ANGIOPLASTY;  Surgeon: Mal Misty, MD;  Location: [facility];  Service: Vascular;  Laterality: N/A;  . AV NODE ABLATION N/A 09/13/2018   Procedure: AV NODE ABLATION;  Surgeon: Evans Lance, MD;  Location: [facility];  Service: Cardiovascular;  Laterality: N/A;  . BIV ICD INSERTION CRT-D  11/10/2017  . BIV ICD INSERTION CRT-D N/A 11/10/2017   Procedure: BIV ICD INSERTION CRT-D;  Surgeon: Evans Lance, MD;  Location: [facility];  Service: Cardiovascular;  Laterality: N/A;  . BREAST SURGERY     LEFT BREAST BIOPSY  . CARDIAC CATHETERIZATION    . CATARACT EXTRACTION W/ INTRAOCULAR LENS  IMPLANT, BILATERAL Bilateral   . CHOLECYSTECTOMY OPEN  1972  . COLONOSCOPY    . EYE SURGERY Bilateral    "bleeding in my eyes"  . FRACTURE SURGERY    . TIBIA FRACTURE SURGERY Left 1970s  . TONSILLECTOMY    . VARICOSE VEIN SURGERY Bilateral    "laser"   Social History   Occupational History  . Not on file  Tobacco Use  . Smoking status: Former Smoker    Packs/day: 2.00    Years: 54.00    Pack years: 108.00    Types: Cigarettes    Quit date: 08/10/2002    Years since quitting: 17.1  . Smokeless tobacco: Never Used  Vaping Use    . Vaping Use: Never used  Substance and Sexual Activity  . Alcohol use: Never  . Drug use: Never  . Sexual activity: Not Currently    Birth control/protection: Post-menopausal

## 2019-09-26 ENCOUNTER — Ambulatory Visit (INDEPENDENT_AMBULATORY_CARE_PROVIDER_SITE_OTHER): Payer: Medicare Other | Admitting: Internal Medicine

## 2019-09-26 ENCOUNTER — Encounter: Payer: Self-pay | Admitting: Internal Medicine

## 2019-09-26 VITALS — BP 114/62 | HR 70 | Temp 97.2°F | Resp 16 | Ht 63.5 in | Wt 178.4 lb

## 2019-09-26 DIAGNOSIS — N1832 Chronic kidney disease, stage 3b: Secondary | ICD-10-CM

## 2019-09-26 DIAGNOSIS — Z79899 Other long term (current) drug therapy: Secondary | ICD-10-CM | POA: Diagnosis not present

## 2019-09-26 DIAGNOSIS — I10 Essential (primary) hypertension: Secondary | ICD-10-CM | POA: Diagnosis not present

## 2019-09-26 DIAGNOSIS — I482 Chronic atrial fibrillation, unspecified: Secondary | ICD-10-CM

## 2019-09-26 DIAGNOSIS — I5042 Chronic combined systolic (congestive) and diastolic (congestive) heart failure: Secondary | ICD-10-CM

## 2019-09-26 DIAGNOSIS — D72829 Elevated white blood cell count, unspecified: Secondary | ICD-10-CM

## 2019-09-26 DIAGNOSIS — I872 Venous insufficiency (chronic) (peripheral): Secondary | ICD-10-CM | POA: Diagnosis not present

## 2019-09-26 NOTE — Patient Instructions (Signed)
Heart Failure, Diagnosis  Heart failure is a condition in which the heart has trouble pumping blood because it has become weak or stiff. This means that the heart does not pump blood well enough for the body to stay healthy. For some people with heart failure, fluid may back up into the lungs. There may also be swelling (edema) in the lower legs. Heart failure is usually a long-term (chronic) condition. It is important for you to take good care of yourself and follow the treatment plan from your health care provider. What are the causes? This condition may be caused by:  High blood pressure (hypertension). Hypertension causes the heart muscle to work harder than normal. This makes the heart stiff or weak.  Coronary artery disease, or CAD. CAD is the buildup of cholesterol and fat (plaque) in the arteries of the heart.  Heart attack, also called myocardial infarction. This injures the heart muscle, making it hard for the heart to pump blood.  Abnormal heart valves. The valves do not open and close properly, forcing the heart to pump harder to keep the blood flowing.  Heart muscle disease (cardiomyopathy or myocarditis). This is damage to the heart muscle. It can increase the risk of heart failure.  Lung disease. The heart works harder when the lungs are not healthy.  Abnormal heart rhythms. These can lead to heart failure. What increases the risk? The risk of heart failure increases as a person ages. This condition is also more likely to develop in people who:  Are overweight.  Are female.  Smoke or chew tobacco.  Abuse alcohol or illegal drugs.  Have taken medicines that can damage the heart, such as chemotherapy drugs.  Have diabetes.  Have abnormal heart rhythms.  Have thyroid problems.  Have low blood counts (anemia). What are the signs or symptoms? Symptoms of this condition include:  Shortness of breath with activity, such as when climbing stairs.  A cough that does not  go away.  Swelling of the feet, ankles, legs, or abdomen.  Losing weight for no reason.  Trouble breathing when lying flat (orthopnea).  Waking from sleep because of the need to sit up and get more air.  Rapid heartbeat.  Tiredness (fatigue) and loss of energy.  Feeling light-headed, dizzy, or close to fainting.  Loss of appetite.  Nausea.  Waking up more often during the night to urinate (nocturia).  Confusion. How is this diagnosed? This condition is diagnosed based on:  Your medical history, symptoms, and a physical exam.  Diagnostic tests, which may include: ? Echocardiogram. ? Electrocardiogram (ECG). ? Chest X-ray. ? Blood tests. ? Exercise stress test. ? Radionuclide scans. ? Cardiac catheterization and angiogram. How is this treated? Treatment for this condition is aimed at managing the symptoms of heart failure. Medicines Treatment may include medicines that:  Help lower blood pressure by relaxing (dilating) the blood vessels. These medicines are called ACE inhibitors (angiotensin-converting enzyme) and ARBs (angiotensin receptor blockers).  Cause the kidneys to remove salt and water from the blood through urination (diuretics).  Improve heart muscle strength and prevent the heart from beating too fast (beta blockers).  Increase the force of the heartbeat (digoxin). Healthy behavior changes     Treatment may also include making healthy lifestyle changes, such as:  Reaching and staying at a healthy weight.  Quitting smoking or chewing tobacco.  Eating heart-healthy foods.  Limiting or avoiding alcohol.  Stopping the use of illegal drugs.  Being physically active.  Other treatments   Other treatments may include:  Procedures to open blocked arteries or repair damaged valves.  Placing a pacemaker to improve heart function (cardiac resynchronization therapy).  Placing a device to treat serious abnormal heart rhythms (implantable cardioverter  defibrillator, or ICD).  Placing a device to improve the pumping ability of the heart (left ventricular assist device, or LVAD).  Receiving a healthy heart from a donor (heart transplant). This is done when other treatments have not helped. Follow these instructions at home:  Manage other health conditions as told by your health care provider. These may include hypertension, diabetes, thyroid disease, or abnormal heart rhythms.  Get ongoing education and support as needed. Learn as much as you can about heart failure.  Keep all follow-up visits as told by your health care provider. This is important. Summary  Heart failure is a condition in which the heart has trouble pumping blood because it has become weak or stiff.  This condition is caused by high blood pressure and other diseases of the heart and lungs.  Symptoms of this condition include shortness of breath, tiredness (fatigue), nausea, and swelling of the feet, ankles, legs, or abdomen.  Treatments for this condition may include medicines, lifestyle changes, and surgery.  Manage other health conditions as told by your health care provider.

## 2019-09-27 DIAGNOSIS — Z9181 History of falling: Secondary | ICD-10-CM | POA: Diagnosis not present

## 2019-09-27 DIAGNOSIS — I5082 Biventricular heart failure: Secondary | ICD-10-CM | POA: Diagnosis not present

## 2019-09-27 DIAGNOSIS — W19XXXD Unspecified fall, subsequent encounter: Secondary | ICD-10-CM | POA: Diagnosis not present

## 2019-09-27 DIAGNOSIS — M170 Bilateral primary osteoarthritis of knee: Secondary | ICD-10-CM | POA: Diagnosis not present

## 2019-09-27 DIAGNOSIS — E871 Hypo-osmolality and hyponatremia: Secondary | ICD-10-CM | POA: Diagnosis not present

## 2019-09-27 DIAGNOSIS — S91001D Unspecified open wound, right ankle, subsequent encounter: Secondary | ICD-10-CM | POA: Diagnosis not present

## 2019-09-27 DIAGNOSIS — I739 Peripheral vascular disease, unspecified: Secondary | ICD-10-CM | POA: Diagnosis not present

## 2019-09-27 DIAGNOSIS — J449 Chronic obstructive pulmonary disease, unspecified: Secondary | ICD-10-CM | POA: Diagnosis not present

## 2019-09-27 DIAGNOSIS — E785 Hyperlipidemia, unspecified: Secondary | ICD-10-CM | POA: Diagnosis not present

## 2019-09-27 DIAGNOSIS — N1832 Chronic kidney disease, stage 3b: Secondary | ICD-10-CM | POA: Diagnosis not present

## 2019-09-27 DIAGNOSIS — E559 Vitamin D deficiency, unspecified: Secondary | ICD-10-CM | POA: Diagnosis not present

## 2019-09-27 DIAGNOSIS — Z9981 Dependence on supplemental oxygen: Secondary | ICD-10-CM | POA: Diagnosis not present

## 2019-09-27 DIAGNOSIS — I5042 Chronic combined systolic (congestive) and diastolic (congestive) heart failure: Secondary | ICD-10-CM | POA: Diagnosis not present

## 2019-09-27 DIAGNOSIS — I429 Cardiomyopathy, unspecified: Secondary | ICD-10-CM | POA: Diagnosis not present

## 2019-09-27 DIAGNOSIS — S91302D Unspecified open wound, left foot, subsequent encounter: Secondary | ICD-10-CM | POA: Diagnosis not present

## 2019-09-27 DIAGNOSIS — M0609 Rheumatoid arthritis without rheumatoid factor, multiple sites: Secondary | ICD-10-CM | POA: Diagnosis not present

## 2019-09-27 DIAGNOSIS — I2510 Atherosclerotic heart disease of native coronary artery without angina pectoris: Secondary | ICD-10-CM | POA: Diagnosis not present

## 2019-09-27 DIAGNOSIS — I130 Hypertensive heart and chronic kidney disease with heart failure and stage 1 through stage 4 chronic kidney disease, or unspecified chronic kidney disease: Secondary | ICD-10-CM | POA: Diagnosis not present

## 2019-09-27 DIAGNOSIS — I482 Chronic atrial fibrillation, unspecified: Secondary | ICD-10-CM | POA: Diagnosis not present

## 2019-09-27 DIAGNOSIS — N179 Acute kidney failure, unspecified: Secondary | ICD-10-CM | POA: Diagnosis not present

## 2019-09-27 DIAGNOSIS — G9340 Encephalopathy, unspecified: Secondary | ICD-10-CM | POA: Diagnosis not present

## 2019-09-27 DIAGNOSIS — E039 Hypothyroidism, unspecified: Secondary | ICD-10-CM | POA: Diagnosis not present

## 2019-09-27 DIAGNOSIS — J9611 Chronic respiratory failure with hypoxia: Secondary | ICD-10-CM | POA: Diagnosis not present

## 2019-09-27 DIAGNOSIS — R7303 Prediabetes: Secondary | ICD-10-CM | POA: Diagnosis not present

## 2019-09-27 DIAGNOSIS — G629 Polyneuropathy, unspecified: Secondary | ICD-10-CM | POA: Diagnosis not present

## 2019-09-27 LAB — COMPLETE METABOLIC PANEL WITH GFR
AG Ratio: 1.8 (calc) (ref 1.0–2.5)
ALT: 17 U/L (ref 6–29)
AST: 12 U/L (ref 10–35)
Albumin: 3.3 g/dL — ABNORMAL LOW (ref 3.6–5.1)
Alkaline phosphatase (APISO): 32 U/L — ABNORMAL LOW (ref 37–153)
BUN/Creatinine Ratio: 34 (calc) — ABNORMAL HIGH (ref 6–22)
BUN: 48 mg/dL — ABNORMAL HIGH (ref 7–25)
CO2: 31 mmol/L (ref 20–32)
Calcium: 8.9 mg/dL (ref 8.6–10.4)
Chloride: 101 mmol/L (ref 98–110)
Creat: 1.41 mg/dL — ABNORMAL HIGH (ref 0.60–0.93)
GFR, Est African American: 42 mL/min/{1.73_m2} — ABNORMAL LOW (ref >=60)
GFR, Est Non African American: 36 mL/min/{1.73_m2} — ABNORMAL LOW (ref >=60)
Globulin: 1.8 g/dL (calc) — ABNORMAL LOW (ref 1.9–3.7)
Glucose, Bld: 114 mg/dL — ABNORMAL HIGH (ref 65–99)
Potassium: 4.2 mmol/L (ref 3.5–5.3)
Sodium: 139 mmol/L (ref 135–146)
Total Bilirubin: 0.7 mg/dL (ref 0.2–1.2)
Total Protein: 5.1 g/dL — ABNORMAL LOW (ref 6.1–8.1)

## 2019-09-27 LAB — URINALYSIS, ROUTINE W REFLEX MICROSCOPIC
Bacteria, UA: NONE SEEN /HPF
Bilirubin Urine: NEGATIVE
Glucose, UA: NEGATIVE
Hgb urine dipstick: NEGATIVE
Hyaline Cast: NONE SEEN /LPF
Ketones, ur: NEGATIVE
Nitrite: NEGATIVE
Protein, ur: NEGATIVE
RBC / HPF: NONE SEEN /HPF (ref 0–2)
Specific Gravity, Urine: 1.017 (ref 1.001–1.03)
Squamous Epithelial / HPF: NONE SEEN /HPF (ref <=5)
pH: <=5 (ref 5.0–8.0)

## 2019-09-27 LAB — CBC WITH DIFFERENTIAL/PLATELET
Absolute Monocytes: 916 cells/uL (ref 200–950)
Basophils Absolute: 24 cells/uL (ref 0–200)
Basophils Relative: 0.2 %
Eosinophils Absolute: 119 cells/uL (ref 15–500)
Eosinophils Relative: 1 %
HCT: 27.5 % — ABNORMAL LOW (ref 35.0–45.0)
Hemoglobin: 8.6 g/dL — ABNORMAL LOW (ref 11.7–15.5)
Lymphs Abs: 1666 cells/uL (ref 850–3900)
MCH: 28.2 pg (ref 27.0–33.0)
MCHC: 31.3 g/dL — ABNORMAL LOW (ref 32.0–36.0)
MCV: 90.2 fL (ref 80.0–100.0)
MPV: 10.4 fL (ref 7.5–12.5)
Monocytes Relative: 7.7 %
Neutro Abs: 9175 cells/uL — ABNORMAL HIGH (ref 1500–7800)
Neutrophils Relative %: 77.1 %
Platelets: 258 10*3/uL (ref 140–400)
RBC: 3.05 10*6/uL — ABNORMAL LOW (ref 3.80–5.10)
RDW: 16.2 % — ABNORMAL HIGH (ref 11.0–15.0)
Total Lymphocyte: 14 %
WBC: 11.9 10*3/uL — ABNORMAL HIGH (ref 3.8–10.8)

## 2019-09-27 LAB — URINE CULTURE
MICRO NUMBER:: 10704997
SPECIMEN QUALITY:: ADEQUATE

## 2019-09-28 ENCOUNTER — Emergency Department (HOSPITAL_COMMUNITY): Payer: Medicare Other

## 2019-09-28 ENCOUNTER — Other Ambulatory Visit: Payer: Self-pay

## 2019-09-28 ENCOUNTER — Emergency Department (HOSPITAL_COMMUNITY)
Admission: EM | Admit: 2019-09-28 | Discharge: 2019-09-28 | Discharge status: Against Medical Advice | Payer: Medicare Other | Source: Home / Self Care

## 2019-09-28 ENCOUNTER — Encounter (HOSPITAL_COMMUNITY): Payer: Self-pay | Admitting: Emergency Medicine

## 2019-09-28 ENCOUNTER — Inpatient Hospital Stay (HOSPITAL_COMMUNITY)
Admission: EM | Admit: 2019-09-28 | Discharge: 2019-10-02 | DRG: 377 | Disposition: A | Discharge status: Home / Self Care | Payer: Medicare Other | Attending: Family Medicine | Admitting: Family Medicine

## 2019-09-28 DIAGNOSIS — Z9181 History of falling: Secondary | ICD-10-CM | POA: Diagnosis not present

## 2019-09-28 DIAGNOSIS — G629 Polyneuropathy, unspecified: Secondary | ICD-10-CM | POA: Diagnosis not present

## 2019-09-28 DIAGNOSIS — I10 Essential (primary) hypertension: Secondary | ICD-10-CM | POA: Diagnosis not present

## 2019-09-28 DIAGNOSIS — K5792 Diverticulitis of intestine, part unspecified, without perforation or abscess without bleeding: Secondary | ICD-10-CM | POA: Diagnosis not present

## 2019-09-28 DIAGNOSIS — I2510 Atherosclerotic heart disease of native coronary artery without angina pectoris: Secondary | ICD-10-CM | POA: Diagnosis not present

## 2019-09-28 DIAGNOSIS — D72829 Elevated white blood cell count, unspecified: Secondary | ICD-10-CM

## 2019-09-28 DIAGNOSIS — E559 Vitamin D deficiency, unspecified: Secondary | ICD-10-CM | POA: Diagnosis not present

## 2019-09-28 DIAGNOSIS — E860 Dehydration: Secondary | ICD-10-CM

## 2019-09-28 DIAGNOSIS — Z20822 Contact with and (suspected) exposure to covid-19: Secondary | ICD-10-CM | POA: Diagnosis not present

## 2019-09-28 DIAGNOSIS — K228 Other specified diseases of esophagus: Secondary | ICD-10-CM | POA: Diagnosis not present

## 2019-09-28 DIAGNOSIS — B3781 Candidal esophagitis: Secondary | ICD-10-CM | POA: Diagnosis present

## 2019-09-28 DIAGNOSIS — Z8711 Personal history of peptic ulcer disease: Secondary | ICD-10-CM

## 2019-09-28 DIAGNOSIS — Z888 Allergy status to other drugs, medicaments and biological substances status: Secondary | ICD-10-CM

## 2019-09-28 DIAGNOSIS — Z800 Family history of malignant neoplasm of digestive organs: Secondary | ICD-10-CM

## 2019-09-28 DIAGNOSIS — G9340 Encephalopathy, unspecified: Secondary | ICD-10-CM | POA: Diagnosis not present

## 2019-09-28 DIAGNOSIS — I130 Hypertensive heart and chronic kidney disease with heart failure and stage 1 through stage 4 chronic kidney disease, or unspecified chronic kidney disease: Secondary | ICD-10-CM | POA: Diagnosis present

## 2019-09-28 DIAGNOSIS — M545 Low back pain: Secondary | ICD-10-CM | POA: Diagnosis not present

## 2019-09-28 DIAGNOSIS — I708 Atherosclerosis of other arteries: Secondary | ICD-10-CM | POA: Diagnosis not present

## 2019-09-28 DIAGNOSIS — D62 Acute posthemorrhagic anemia: Secondary | ICD-10-CM | POA: Diagnosis present

## 2019-09-28 DIAGNOSIS — J449 Chronic obstructive pulmonary disease, unspecified: Secondary | ICD-10-CM | POA: Diagnosis not present

## 2019-09-28 DIAGNOSIS — Z95810 Presence of automatic (implantable) cardiac defibrillator: Secondary | ICD-10-CM

## 2019-09-28 DIAGNOSIS — N179 Acute kidney failure, unspecified: Secondary | ICD-10-CM | POA: Diagnosis present

## 2019-09-28 DIAGNOSIS — Z79890 Hormone replacement therapy (postmenopausal): Secondary | ICD-10-CM

## 2019-09-28 DIAGNOSIS — D649 Anemia, unspecified: Secondary | ICD-10-CM | POA: Diagnosis not present

## 2019-09-28 DIAGNOSIS — E871 Hypo-osmolality and hyponatremia: Secondary | ICD-10-CM | POA: Diagnosis not present

## 2019-09-28 DIAGNOSIS — I739 Peripheral vascular disease, unspecified: Secondary | ICD-10-CM | POA: Diagnosis not present

## 2019-09-28 DIAGNOSIS — I4891 Unspecified atrial fibrillation: Secondary | ICD-10-CM | POA: Diagnosis present

## 2019-09-28 DIAGNOSIS — K5732 Diverticulitis of large intestine without perforation or abscess without bleeding: Secondary | ICD-10-CM | POA: Diagnosis not present

## 2019-09-28 DIAGNOSIS — M069 Rheumatoid arthritis, unspecified: Secondary | ICD-10-CM | POA: Diagnosis not present

## 2019-09-28 DIAGNOSIS — Z885 Allergy status to narcotic agent status: Secondary | ICD-10-CM

## 2019-09-28 DIAGNOSIS — K5733 Diverticulitis of large intestine without perforation or abscess with bleeding: Secondary | ICD-10-CM | POA: Diagnosis not present

## 2019-09-28 DIAGNOSIS — K259 Gastric ulcer, unspecified as acute or chronic, without hemorrhage or perforation: Secondary | ICD-10-CM | POA: Diagnosis present

## 2019-09-28 DIAGNOSIS — E039 Hypothyroidism, unspecified: Secondary | ICD-10-CM | POA: Diagnosis not present

## 2019-09-28 DIAGNOSIS — K219 Gastro-esophageal reflux disease without esophagitis: Secondary | ICD-10-CM | POA: Diagnosis present

## 2019-09-28 DIAGNOSIS — Z87891 Personal history of nicotine dependence: Secondary | ICD-10-CM

## 2019-09-28 DIAGNOSIS — Z9981 Dependence on supplemental oxygen: Secondary | ICD-10-CM

## 2019-09-28 DIAGNOSIS — K319 Disease of stomach and duodenum, unspecified: Secondary | ICD-10-CM | POA: Diagnosis present

## 2019-09-28 DIAGNOSIS — J9611 Chronic respiratory failure with hypoxia: Secondary | ICD-10-CM | POA: Diagnosis not present

## 2019-09-28 DIAGNOSIS — R7303 Prediabetes: Secondary | ICD-10-CM | POA: Diagnosis present

## 2019-09-28 DIAGNOSIS — I712 Thoracic aortic aneurysm, without rupture: Secondary | ICD-10-CM | POA: Diagnosis present

## 2019-09-28 DIAGNOSIS — K5900 Constipation, unspecified: Secondary | ICD-10-CM | POA: Diagnosis present

## 2019-09-28 DIAGNOSIS — K922 Gastrointestinal hemorrhage, unspecified: Secondary | ICD-10-CM | POA: Diagnosis not present

## 2019-09-28 DIAGNOSIS — M109 Gout, unspecified: Secondary | ICD-10-CM | POA: Diagnosis not present

## 2019-09-28 DIAGNOSIS — Z8249 Family history of ischemic heart disease and other diseases of the circulatory system: Secondary | ICD-10-CM

## 2019-09-28 DIAGNOSIS — W19XXXD Unspecified fall, subsequent encounter: Secondary | ICD-10-CM | POA: Diagnosis not present

## 2019-09-28 DIAGNOSIS — I482 Chronic atrial fibrillation, unspecified: Secondary | ICD-10-CM

## 2019-09-28 DIAGNOSIS — Z803 Family history of malignant neoplasm of breast: Secondary | ICD-10-CM

## 2019-09-28 DIAGNOSIS — T380X5A Adverse effect of glucocorticoids and synthetic analogues, initial encounter: Secondary | ICD-10-CM | POA: Diagnosis present

## 2019-09-28 DIAGNOSIS — E785 Hyperlipidemia, unspecified: Secondary | ICD-10-CM | POA: Diagnosis not present

## 2019-09-28 DIAGNOSIS — G9341 Metabolic encephalopathy: Secondary | ICD-10-CM | POA: Diagnosis present

## 2019-09-28 DIAGNOSIS — S91302D Unspecified open wound, left foot, subsequent encounter: Secondary | ICD-10-CM | POA: Diagnosis not present

## 2019-09-28 DIAGNOSIS — Z7901 Long term (current) use of anticoagulants: Secondary | ICD-10-CM | POA: Diagnosis not present

## 2019-09-28 DIAGNOSIS — J439 Emphysema, unspecified: Secondary | ICD-10-CM

## 2019-09-28 DIAGNOSIS — Z820 Family history of epilepsy and other diseases of the nervous system: Secondary | ICD-10-CM

## 2019-09-28 DIAGNOSIS — I714 Abdominal aortic aneurysm, without rupture: Secondary | ICD-10-CM | POA: Diagnosis present

## 2019-09-28 DIAGNOSIS — Z823 Family history of stroke: Secondary | ICD-10-CM

## 2019-09-28 DIAGNOSIS — K529 Noninfective gastroenteritis and colitis, unspecified: Secondary | ICD-10-CM | POA: Diagnosis not present

## 2019-09-28 DIAGNOSIS — G8929 Other chronic pain: Secondary | ICD-10-CM | POA: Diagnosis present

## 2019-09-28 DIAGNOSIS — Z811 Family history of alcohol abuse and dependence: Secondary | ICD-10-CM

## 2019-09-28 DIAGNOSIS — I429 Cardiomyopathy, unspecified: Secondary | ICD-10-CM | POA: Diagnosis not present

## 2019-09-28 DIAGNOSIS — Z7982 Long term (current) use of aspirin: Secondary | ICD-10-CM

## 2019-09-28 DIAGNOSIS — Z03818 Encounter for observation for suspected exposure to other biological agents ruled out: Secondary | ICD-10-CM | POA: Diagnosis not present

## 2019-09-28 DIAGNOSIS — E782 Mixed hyperlipidemia: Secondary | ICD-10-CM | POA: Diagnosis present

## 2019-09-28 DIAGNOSIS — N390 Urinary tract infection, site not specified: Secondary | ICD-10-CM

## 2019-09-28 DIAGNOSIS — N1832 Chronic kidney disease, stage 3b: Secondary | ICD-10-CM | POA: Diagnosis present

## 2019-09-28 DIAGNOSIS — Z818 Family history of other mental and behavioral disorders: Secondary | ICD-10-CM

## 2019-09-28 DIAGNOSIS — T40605A Adverse effect of unspecified narcotics, initial encounter: Secondary | ICD-10-CM | POA: Diagnosis present

## 2019-09-28 DIAGNOSIS — Z7951 Long term (current) use of inhaled steroids: Secondary | ICD-10-CM

## 2019-09-28 DIAGNOSIS — I700 Atherosclerosis of aorta: Secondary | ICD-10-CM | POA: Diagnosis not present

## 2019-09-28 DIAGNOSIS — M170 Bilateral primary osteoarthritis of knee: Secondary | ICD-10-CM | POA: Diagnosis not present

## 2019-09-28 DIAGNOSIS — Z85819 Personal history of malignant neoplasm of unspecified site of lip, oral cavity, and pharynx: Secondary | ICD-10-CM

## 2019-09-28 DIAGNOSIS — F329 Major depressive disorder, single episode, unspecified: Secondary | ICD-10-CM | POA: Diagnosis present

## 2019-09-28 DIAGNOSIS — I5082 Biventricular heart failure: Secondary | ICD-10-CM | POA: Diagnosis not present

## 2019-09-28 DIAGNOSIS — S91301A Unspecified open wound, right foot, initial encounter: Secondary | ICD-10-CM

## 2019-09-28 DIAGNOSIS — M0609 Rheumatoid arthritis without rheumatoid factor, multiple sites: Secondary | ICD-10-CM | POA: Diagnosis not present

## 2019-09-28 DIAGNOSIS — K3189 Other diseases of stomach and duodenum: Secondary | ICD-10-CM | POA: Diagnosis not present

## 2019-09-28 DIAGNOSIS — Z79899 Other long term (current) drug therapy: Secondary | ICD-10-CM

## 2019-09-28 DIAGNOSIS — I5042 Chronic combined systolic (congestive) and diastolic (congestive) heart failure: Secondary | ICD-10-CM | POA: Diagnosis not present

## 2019-09-28 DIAGNOSIS — K921 Melena: Secondary | ICD-10-CM | POA: Diagnosis not present

## 2019-09-28 DIAGNOSIS — M8580 Other specified disorders of bone density and structure, unspecified site: Secondary | ICD-10-CM | POA: Diagnosis present

## 2019-09-28 DIAGNOSIS — S91001D Unspecified open wound, right ankle, subsequent encounter: Secondary | ICD-10-CM | POA: Diagnosis not present

## 2019-09-28 DIAGNOSIS — Z0184 Encounter for antibody response examination: Secondary | ICD-10-CM

## 2019-09-28 DIAGNOSIS — K229 Disease of esophagus, unspecified: Secondary | ICD-10-CM | POA: Diagnosis not present

## 2019-09-28 DIAGNOSIS — Z79891 Long term (current) use of opiate analgesic: Secondary | ICD-10-CM

## 2019-09-28 LAB — BASIC METABOLIC PANEL
Anion gap: 11 (ref 5–15)
BUN: 42 mg/dL — ABNORMAL HIGH (ref 8–23)
CO2: 27 mmol/L (ref 22–32)
Calcium: 8 mg/dL — ABNORMAL LOW (ref 8.9–10.3)
Chloride: 96 mmol/L — ABNORMAL LOW (ref 98–111)
Creatinine, Ser: 1.34 mg/dL — ABNORMAL HIGH (ref 0.44–1.00)
GFR calc Af Amer: 44 mL/min — ABNORMAL LOW (ref >60)
GFR calc non Af Amer: 38 mL/min — ABNORMAL LOW (ref >60)
Glucose, Bld: 97 mg/dL (ref 70–99)
Potassium: 4.6 mmol/L (ref 3.5–5.1)
Sodium: 134 mmol/L — ABNORMAL LOW (ref 135–145)

## 2019-09-28 LAB — CBC WITH DIFFERENTIAL/PLATELET
Abs Immature Granulocytes: 0.13 10*3/uL — ABNORMAL HIGH (ref 0.00–0.07)
Basophils Absolute: 0 10*3/uL (ref 0.0–0.1)
Basophils Relative: 0 %
Eosinophils Absolute: 0 10*3/uL (ref 0.0–0.5)
Eosinophils Relative: 0 %
HCT: 26.1 % — ABNORMAL LOW (ref 36.0–46.0)
Hemoglobin: 8 g/dL — ABNORMAL LOW (ref 12.0–15.0)
Immature Granulocytes: 1 %
Lymphocytes Relative: 5 %
Lymphs Abs: 0.9 10*3/uL (ref 0.7–4.0)
MCH: 28.1 pg (ref 26.0–34.0)
MCHC: 30.7 g/dL (ref 30.0–36.0)
MCV: 91.6 fL (ref 80.0–100.0)
Monocytes Absolute: 1 10*3/uL (ref 0.1–1.0)
Monocytes Relative: 6 %
Neutro Abs: 15.6 10*3/uL — ABNORMAL HIGH (ref 1.7–7.7)
Neutrophils Relative %: 88 %
Platelets: 254 10*3/uL (ref 150–400)
RBC: 2.85 MIL/uL — ABNORMAL LOW (ref 3.87–5.11)
RDW: 19.6 % — ABNORMAL HIGH (ref 11.5–15.5)
WBC: 17.6 10*3/uL — ABNORMAL HIGH (ref 4.0–10.5)
nRBC: 0 % (ref 0.0–0.2)

## 2019-09-28 LAB — PROTIME-INR
INR: 1.5 — ABNORMAL HIGH (ref 0.8–1.2)
Prothrombin Time: 17.3 seconds — ABNORMAL HIGH (ref 11.4–15.2)

## 2019-09-28 LAB — PREPARE RBC (CROSSMATCH)

## 2019-09-28 MED ORDER — FENTANYL CITRATE (PF) 100 MCG/2ML IJ SOLN
50.0000 ug | Freq: Once | INTRAMUSCULAR | Status: AC
  Administered 2019-09-28: 50 ug via INTRAVENOUS
  Filled 2019-09-28: qty 2

## 2019-09-28 MED ORDER — SODIUM CHLORIDE 0.9 % IV SOLN
10.0000 mL/h | Freq: Once | INTRAVENOUS | Status: AC
  Administered 2019-09-28: 10 mL/h via INTRAVENOUS

## 2019-09-28 MED ORDER — PANTOPRAZOLE SODIUM 40 MG IV SOLR
40.0000 mg | Freq: Once | INTRAVENOUS | Status: AC
  Administered 2019-09-28: 40 mg via INTRAVENOUS
  Filled 2019-09-28: qty 40

## 2019-09-28 MED ORDER — ONDANSETRON HCL 4 MG PO TABS: 4.0000 mg | ORAL_TABLET | Freq: Four times a day (QID) | ORAL | Status: DC | PRN

## 2019-09-28 MED ORDER — IOHEXOL 350 MG/ML SOLN
100.0000 mL | Freq: Once | INTRAVENOUS | Status: AC | PRN
  Administered 2019-09-28: 100 mL via INTRAVENOUS

## 2019-09-28 MED ORDER — ONDANSETRON HCL 4 MG/2ML IJ SOLN
4.0000 mg | Freq: Four times a day (QID) | INTRAMUSCULAR | Status: DC | PRN
  Administered 2019-09-29 – 2019-10-01 (×2): 4 mg via INTRAVENOUS
  Filled 2019-09-28 (×2): qty 2

## 2019-09-28 NOTE — Progress Notes (Signed)
=========================================================  -   Labs show Red Cell Ct / Hgb has dropped down from 11.1 gm% to   now 8.6 gm% over the last week   - Recommend go to ER to have CBC rechecked & check for GI bleeding.   ----------------------------------------------------------------------------------------------------  - Kidney functions stable =========================================================  - Urine Culture Negative - No Infection  =========================================================

## 2019-09-28 NOTE — ED Notes (Signed)
Report to Dellis Filbert, [place]

## 2019-09-28 NOTE — ED Provider Notes (Signed)
[facility] EMERGENCY DEPARTMENT Provider Note   CSN: [id number] Arrival date & time: 09/28/19  1243     History Chief Complaint  Patient presents with   Abnormal Lab    Katie [name] is a 79 y.o. female.  HPI Patient presents after lab abnormality.  Had PCP appointment 2 days ago for normal visit.  Had mildly elevated white count but also had hemoglobin of 8.6.  Baseline hemoglobin appears to be around 11.  Sent in for further evaluation of this.  Has a history of atrial fibrillation and is on anticoagulation.  Also has had abdominal aortic aneurysm repair.  Has had black stools for a while now.  She is on Decadron for rheumatoid arthritis.  Not really feeling lightheaded but is worried because she has had lower abdominal pain for a while now 2.  No dysuria.  States she felt lightheaded after coming to the ER but really not been feeling bad before that.  Pain in the lower abdomen is dull.  Has not had fevers.  Had colonoscopy years ago but states she was told she did not need them anymore because of her age.  Does not have a regular gastroenterologist.    Past Medical History:  Diagnosis Date   AAA (abdominal aortic aneurysm) ([place])    AICD (automatic cardioverter/defibrillator) present 11/10/2017   Arthritis    "some in my knees" (11/10/2017)   Atrial fibrillation (HCC)    CHF (congestive heart failure) ([place])    Chronic bronchitis (HCC)    COPD (chronic obstructive pulmonary disease) (HCC)    Depression    GERD (gastroesophageal reflux disease)    Gout    "daily RX" (11/10/2017)   History of hiatal hernia    Hyperlipidemia    Hypertension    Hypothyroid    Migraine headache    "hx; none since 1980s" (11/10/2017)   Osteopenia    Pneumonia    "~ 3 times" (11/10/2017)   PVD (peripheral vascular disease) ([place])     Patient Active Problem List   Diagnosis Date Noted   Acute GI bleeding 09/28/2019   Acute encephalopathy 08/12/2019   Senile purpura ([place])  05/30/2019   Rheumatoid arthritis involving multiple sites ([place]) 05/30/2019   Obesity (BMI 30.0-34.9) 05/30/2019   Mononeuropathy 11/23/2018   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 10/13/2018   COPD (chronic obstructive pulmonary disease) with chronic bronchitis ([place]) 02/02/2018   Aortic atherosclerosis ([place]) 10/17/2017   PAH (pulmonary artery hypertension) ([place]) 06/23/2017   Gout 03/20/2017   Abnormal glucose 09/19/2015   AAA (abdominal aortic aneurysm) without rupture ([place]) 04/22/2015   PVD (peripheral vascular disease) with claudication ([place]) 10/16/2013   CKD (chronic kidney disease) stage 3, GFR 30-59 ml/min 06/08/2013   Hyperlipidemia, mixed 06/08/2013   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   Long term current use of anticoagulant therapy 04/23/2013   Vitamin D deficiency 02/15/2013   Hypothyroidism    Osteopenia    Chronic combined systolic and diastolic CHF (congestive heart failure) ([place]) 11/25/2008   Essential hypertension 11/22/2008   Atherosclerosis of native coronary artery of native heart without angina pectoris 11/22/2008   Atrial fibrillation ([place]) 11/22/2008   Chronic obstructive pulmonary disease with hypoxia ([place]) 11/22/2008   Gastroesophageal reflux disease without esophagitis 11/22/2008    Past Surgical History:  Procedure Laterality Date   ABDOMINAL AORTIC ENDOVASCULAR STENT GRAFT N/A 04/11/2013   Procedure: ABDOMINAL AORTIC ENDOVASCULAR STENT GRAFT WITH RIGHT FEMORAL PATCH ANGIOPLASTY;  Surgeon: Mal Misty, MD;  Location: [facility]  OR;  Service: Vascular;  Laterality: N/A;   AV NODE ABLATION N/A 09/13/2018   Procedure: AV NODE ABLATION;  Surgeon: Evans Lance, MD;  Location: [place] [facility];  Service: Cardiovascular;  Laterality: N/A;   BIV ICD INSERTION CRT-D  11/10/2017   BIV ICD INSERTION CRT-D N/A 11/10/2017   Procedure: BIV ICD INSERTION CRT-D;  Surgeon: Evans Lance, MD;   Location: [place] [facility];  Service: Cardiovascular;  Laterality: N/A;   BREAST SURGERY     LEFT BREAST BIOPSY   CARDIAC CATHETERIZATION     CATARACT EXTRACTION W/ INTRAOCULAR LENS  IMPLANT, BILATERAL Bilateral    CHOLECYSTECTOMY OPEN  1972   COLONOSCOPY     EYE SURGERY Bilateral    "bleeding in my eyes"   FRACTURE SURGERY     TIBIA FRACTURE SURGERY Left 1970s   TONSILLECTOMY     VARICOSE VEIN SURGERY Bilateral    "laser"     OB History    Gravida  4   Para  3   Term  3   Preterm      AB  1   Living  3     SAB      TAB      Ectopic  0   Multiple      Live Births              Family History  Problem Relation Age of Onset   Cirrhosis Mother    Cancer Mother 26       PANCREAS   Heart defect Sister    Breast cancer Sister        age 69   Heart disease Sister    Stroke Sister    Alcohol abuse Father    Depression Father    Hypertension Brother    Hyperlipidemia Son    Heart disease Daughter     Social History   Tobacco Use   Smoking status: Former Smoker    Packs/day: 2.00    Years: 54.00    Pack years: 108.00    Types: Cigarettes    Quit date: 08/10/2002    Years since quitting: 17.1   Smokeless tobacco: Never Used  Vaping Use   Vaping Use: Never used  Substance Use Topics   Alcohol use: Never   Drug use: Never    Home Medications Prior to Admission medications   Medication Sig Start Date End Date Taking? Authorizing Provider  acetaminophen (TYLENOL) 500 MG tablet Take 1,000 mg by mouth every 6 (six) hours as needed for mild pain or moderate pain.   Yes [provider]  albuterol (PROAIR HFA) 108 (90 Base) MCG/ACT inhaler Use 2 Inhalations 15 minutes Apart every 4 hours to Rescue Asthma Attack Patient taking differently: Inhale 2 puffs into the lungs See admin instructions. Use 2 inhalations 15 minutes apart every 4 hours as needed for asthma attack 07/29/18  Yes [name], MD  allopurinol  (ZYLOPRIM) 300 MG tablet Take 0.5 tablets (150 mg total) by mouth daily. 09/20/19  Yes Vicie Mutters, PA-C  aspirin EC 81 MG tablet Take 81 mg by mouth daily.   Yes [provider]  budesonide-formoterol (SYMBICORT) 80-4.5 MCG/ACT inhaler Use 2 inhalations 30 minutes apart 2 x /day (every 12 hours) Patient taking differently: Inhale 2 puffs into the lungs daily.  08/28/19  Yes [name], MD  cetirizine (ZYRTEC) 10 MG tablet Take 10 mg by mouth daily as needed for allergies.    Yes [provider]  dexamethasone (DECADRON) 0.5 MG tablet Take 1 tablet PO TID for 3 days, then take 1 tablet PO BID for 3 days, then take 1 tablet PO for 5 days. Patient taking differently: Take 0.5 mg by mouth as directed. Take 1 tablet by mouth three times a day for 3 days, then take 1 tablet by mouth twice a day for 3 days, then take 1 tablet by mouth daily for 5 days. 09/20/19  Yes Vicie Mutters, PA-C  Eyelid Cleansers (AVENOVA) 0.01 % SOLN Apply 1 application topically in the morning and at bedtime. Wash eyelids twice daily with cleaner 11/28/17  Yes [provider]  ipratropium (ATROVENT) 0.06 % nasal spray Use 1 to 2 sprays each Nostril 2 to 3 x / day as needed Patient taking differently: Place 1-2 sprays into both nostrils 3 (three) times daily as needed for rhinitis (congestion).  02/27/19 02/28/20 Yes [name], MD  ipratropium-albuterol (DUONEB) 0.5-2.5 (3) MG/3ML SOLN Inhale 3 mLs into the lungs every 6 (six) hours as needed (for shortness of breath or wheezing). 02/27/19  Yes [name], MD  L-Lysine 500 MG TABS Take 500 mg by mouth daily as needed.    Yes [provider]  leflunomide (ARAVA) 20 MG tablet Take 10 mg by mouth daily.  06/18/19  Yes [provider]  levothyroxine (SYNTHROID) 125 MCG tablet Take 1 tablet (125 mcg total) by mouth daily before breakfast. 08/18/19  Yes Adhikari, Amrit, MD  montelukast (SINGULAIR) 10 MG tablet TAKE 1 TABLET DAILY  FOR ALLERGIES & ASTHMA Patient taking differently: Take 10 mg by mouth daily. for Allergies & Asthma 02/27/19  Yes [name], MD  OXYGEN Inhale 2 L into the lungs continuous.   Yes [provider]  Polyethyl Glycol-Propyl Glycol (SYSTANE) 0.4-0.3 % SOLN Place 1 drop into both eyes 4 (four) times daily as needed (for dry eyes).    Yes [provider]  potassium chloride SA (KLOR-CON) 20 MEQ tablet Take 20 mEq by mouth daily.    Yes [provider]  pravastatin (PRAVACHOL) 20 MG tablet Take 1 tablet at Bedtime for Cholesterol Patient taking differently: Take 10 mg by mouth every other day.  09/21/19  Yes [name], MD  pregabalin (LYRICA) 25 MG capsule Take 1 capsule 2 to 3 x /day as needed for Neuropathy pain Patient taking differently: Take 25 mg by mouth 3 (three) times daily. Take 1 capsule 2 to 3 x /day as needed for Neuropathy pain 08/30/19  Yes [name], MD  rivaroxaban (XARELTO) 20 MG TABS tablet Take 20 mg by mouth daily with supper.   Yes [provider]  torsemide (DEMADEX) 20 MG tablet Take 2 tablets (40 mg total) by mouth 2 (two) times daily. Patient taking differently: Take 20-40 mg by mouth 2 (two) times daily.  08/28/18  Yes Evans Lance, MD    Allergies    Amiodarone, Diovan [valsartan], Doxycycline, Flexeril [cyclobenzaprine], Keflex [cephalexin], Verapamil, and Codeine  Review of Systems   Review of Systems  Constitutional: Positive for appetite change.  HENT: Negative for congestion.   Respiratory: Negative for shortness of breath.   Cardiovascular: Negative for chest pain.  Gastrointestinal: Positive for abdominal pain. Negative for nausea.  Genitourinary: Negative for flank pain.  Musculoskeletal: Negative for back pain.  Neurological: Positive for light-headedness.  Psychiatric/Behavioral: Negative for confusion.    Physical Exam Updated Vital Signs BP 129/67    Pulse 69    Temp 99.2 F (37.3 C) (Oral)  Resp 18    Ht 5' 3.5" (1.613 m)    Wt 80 kg    SpO2 94%    BMI 30.75 kg/m   Physical Exam Vitals and nursing note reviewed.  HENT:     Head: Atraumatic.  Eyes:     Pupils: Pupils are equal, round, and reactive to light.  Cardiovascular:     Rate and Rhythm: Rhythm irregular.  Pulmonary:     Breath sounds: No wheezing or rhonchi.  Abdominal:     Comments: Mild lower abdominal tenderness without rebound or guarding.  Musculoskeletal:     Cervical back: Neck supple.     Right lower leg: Edema present.     Left lower leg: Edema present.  Skin:    General: Skin is warm.     Capillary Refill: Capillary refill takes less than 2 seconds.  Neurological:     Mental Status: She is alert and oriented to person, place, and time.     ED Results / Procedures / Treatments   Labs (all labs ordered are listed, but only abnormal results are displayed) Labs Reviewed  CBC WITH DIFFERENTIAL/PLATELET - Abnormal; Notable for the following components:      Result Value   WBC 17.6 (*)    RBC 2.85 (*)    Hemoglobin 8.0 (*)    HCT 26.1 (*)    RDW 19.6 (*)    Neutro Abs 15.6 (*)    Abs Immature Granulocytes 0.13 (*)    All other components within normal limits  BASIC METABOLIC PANEL - Abnormal; Notable for the following components:   Sodium 134 (*)    Chloride 96 (*)    BUN 42 (*)    Creatinine, Ser 1.34 (*)    Calcium 8.0 (*)    GFR calc non Af Amer 38 (*)    GFR calc Af Amer 44 (*)    All other components within normal limits  PROTIME-INR - Abnormal; Notable for the following components:   Prothrombin Time 17.3 (*)    INR 1.5 (*)    All other components within normal limits  SARS CORONAVIRUS 2 BY RT PCR (HOSPITAL ORDER, [facility])  SAR COV2 SEROLOGY (COVID19)AB(IGG),IA  COMPREHENSIVE METABOLIC PANEL  CBC  PROTIME-INR  APTT  MAGNESIUM  PHOSPHORUS  POC OCCULT BLOOD, ED  TYPE AND SCREEN  PREPARE RBC (CROSSMATCH)    EKG None  Radiology CT Angio  Abd/Pel W and/or Wo Contrast  Result Date: 09/28/2019 CLINICAL DATA:  Bilateral lower abdominal pain for several days, anemia EXAM: CTA ABDOMEN AND PELVIS WITHOUT AND WITH CONTRAST TECHNIQUE: Multidetector CT imaging of the abdomen and pelvis was performed using the standard protocol during bolus administration of intravenous contrast. Multiplanar reconstructed images and MIPs were obtained and reviewed to evaluate the vascular anatomy. CONTRAST:  180mL OMNIPAQUE IOHEXOL 350 MG/ML SOLN COMPARISON:  04/22/2015 FINDINGS: VASCULAR Aorta: There is fusiform dilatation of the distal thoracic aorta measuring up to 3.7 cm in diameter. There is evidence of prior endoluminal stent graft repair of the abdominal aorta, with no evidence of recurrence or residual aneurysm. There is diffuse atherosclerosis without flow limiting stenosis. No evidence of dissection. Celiac: Patent without evidence of aneurysm, dissection, vasculitis or significant stenosis. SMA: Patent without evidence of aneurysm, dissection, vasculitis or significant stenosis. Renals: Both renal arteries are patent without evidence of aneurysm, dissection, vasculitis, fibromuscular dysplasia or significant stenosis. IMA: The IMA is not identified, likely secondary to occlusion after endoluminal stent graft repair. Inflow:  Moderate atherosclerosis of the bilateral internal and external iliac arteries. There is approximately 50% stenosis of the distal left common iliac artery just beyond the left iliac limb of the stent graft and just proximal to the left iliac bifurcation. No significant stenosis on the right. Proximal Outflow: Bilateral common femoral and visualized portions of the superficial and profunda femoral arteries are patent without evidence of aneurysm, dissection, vasculitis or significant stenosis. Veins: No obvious venous abnormality within the limitations of this arterial phase study. Review of the MIP images confirms the above findings.  NON-VASCULAR Lower chest: Emphysema and bibasilar scarring. No acute pleural or parenchymal lung disease. The heart is enlarged, with marked right atrial dilatation. Hepatobiliary: No focal liver abnormality is seen. Status post cholecystectomy. No biliary dilatation. Pancreas: Unremarkable. No pancreatic ductal dilatation or surrounding inflammatory changes. Spleen: Normal in size without focal abnormality. Adrenals/Urinary Tract: Adrenal glands are unremarkable. Kidneys are normal, without renal calculi, focal lesion, or hydronephrosis. There is wall thickening along the left superior aspect of the bladder, likely due to adjacent inflammatory changes involving the sigmoid colon. Stomach/Bowel: No bowel obstruction or ileus. There is diverticulosis of the sigmoid colon, with wall thickening and pericolonic fat stranding involving the proximal sigmoid colon consistent with acute uncomplicated diverticulitis. There is no intraluminal contrast accumulation to suggest active gastrointestinal bleeding. Lymphatic: No pathologic adenopathy within the abdomen or pelvis. Reproductive: Uterus and bilateral adnexa are unremarkable. Other: There is fat stranding within the left lower quadrant most likely representing acute sigmoid diverticulitis. No free fluid or free gas. Musculoskeletal: No acute or destructive bony lesions. Reconstructed images demonstrate no additional findings. IMPRESSION: VASCULAR 1. No evidence of active gastrointestinal bleeding. 2. 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 3. Postsurgical changes from endoluminal stent graft repair of the abdominal aorta. No evidence of recurrence or residual aneurysm. No complication. 4. Diffuse atherosclerosis. There is focal stenosis in the distal left common iliac artery just proximal to the bifurcation approaching 50%. No other critical stenoses are identified. NON-VASCULAR 1. Findings most consistent with acute sigmoid  diverticulitis. No evidence of perforation, fluid collection, or abscess. Close follow-up is recommended to ensure resolution after appropriate medical management, and exclude underlying neoplasm. 2. Inflammatory changes within the left lower quadrant mesentery related to the diverticulitis described above. There is secondary inflammatory change along the left superior aspect of the bladder. 3. Aortic Atherosclerosis (ICD10-I70.0) and Emphysema (ICD10-J43.9). Electronically Signed   By: Randa Ngo M.D.   On: 09/28/2019 22:07    Procedures Procedures (including critical care time)  Medications Ordered in ED Medications  ondansetron (ZOFRAN) tablet 4 mg (has no administration in time range)    Or  ondansetron (ZOFRAN) injection 4 mg (has no administration in time range)  pantoprazole (PROTONIX) injection 40 mg (40 mg Intravenous Given 09/28/19 2122)  0.9 %  sodium chloride infusion (0 mL/hr Intravenous Stopped 09/28/19 2306)  iohexol (OMNIPAQUE) 350 MG/ML injection 100 mL (100 mLs Intravenous Contrast Given 09/28/19 2131)  fentaNYL (SUBLIMAZE) injection 50 mcg (50 mcg Intravenous Given 09/28/19 2208)    ED Course  I have reviewed the triage vital signs and the nursing notes.  Pertinent labs & imaging results that were available during my care of the patient were reviewed by me and considered in my medical decision making (see chart for details).    MDM Rules/Calculators/A&P  Patient presents with lower abdominal pain black stool and anemia.  PCP checked blood work and showed a hemoglobin in the eights.  States she has had dark stools.  Stool is guaiac positive.  She has been on steroids for her rheumatoid arthritis.  Could be a cause of an upper GI bleed such as an ulcer.  Discussed with Dr. Laural Golden, who will be able to see the patient in the hospital and potentially scope tomorrow. Patient however is also had a white count elevated.  Could be secondary to the steroids.   CT scan shows diverticulitis.  He will require antibiotics.  Admit to internal medicine Final Clinical Impression(s) / ED Diagnoses Final diagnoses:  Gastrointestinal hemorrhage, unspecified gastrointestinal hemorrhage type  Diverticulitis    Rx / DC Orders ED Discharge Orders    None       Davonna Belling, MD 09/28/19 2352

## 2019-09-28 NOTE — ED Notes (Signed)
Physician at bedside.

## 2019-09-28 NOTE — ED Notes (Signed)
Pt reports lab stuck her 6 times to try to get blood on her   Request for Vivien Rota, RN to start IV

## 2019-09-28 NOTE — ED Notes (Signed)
Pt family member is in ED and inquiring why several patients have been brought back before her mother. Discussed with family ED throughput and pt care plan. Pt family wants an update on wait time.Reached out to ED Charge RN to speak with pt and pt family in waiting room.

## 2019-09-28 NOTE — H&P (Signed)
History and Physical  Katie [name] BPZ:[id number] DOB: [birth year] DOA: 09/28/2019  Referring physician: Davonna Belling MD PCP: [name], MD  Patient coming from: Home   Chief Complaint: Abnormal Lab  HPI: Katie [name] is a 79 y.o. female with medical history significant for HTN, HLD, ASHD/cAfib, chCHF with a BiVent AICD, CKD3,Prediabetes, Hypothyroidism, Gout , Rheumatoid Arthritisand Vitamin D Deficiencywho presents to the emergency department due to abnormal labs.  Patient had an office visit to her PCP 2 days ago during which blood work was done and she was noted to have elevated WBC and hemoglobin of 8.6 (dropped from 11.1 within 1 week), so she was sent to the ED for further evaluation and management.  Patient states that she has chronic back pain and that she was recently started on steroid taper about 10 days ago, she also complained that she has been having black stools since last 3 days.  Patient states that she was recently on antibiotics (ciprofloxacin for 2 days) due to presumed UTI.  She complains of lower abdominal pain and she also complained of burning sensation and decreased urination that has been ongoing for about 2 weeks.  Patient states that she recently sustained a fall about 5-6 weeks ago during which she sustained an abrasion on the dorsum of her right foot.  Patient complained of feeling lightheaded after arrival to the ER.  Last colonoscopy was several years ago after which she was told that she did not need any further colonoscopy due to her age.  She denies taking NSAIDs and also denies fever, chills, nausea, vomiting, shortness of breath or having any sick contact.  ED Course:  In the emergency department, she was hemodynamically stable.  Work-up in the ED showed leukocytosis, normocytic anemia, mild hyponatremia, BUN/creatinine 42/1.34 (baseline creatinine 1.1-1.3), calcium 8.0, albumin 3.3.  Stool is guaiac positive per ED physician.  CT abdomen and pelvis  with and without contrast showed no evidence of active GI bleeding, but showed findings most consistent with acute sigmoid colitis with no evidence of perforation, fluid collection or abscess.  She was treated with IV fentanyl 11mcg x 1, IV Protonix was given and patient was provided with IV hydration.  Hospitalist was asked to admit patient for further evaluation and management  Review of Systems: Constitutional: Positive for change in appetite.  Negative for chills and fever.  HENT: Negative for ear pain and sore throat.   Eyes: Negative for pain and visual disturbance.  Respiratory: Negative for cough, chest tightness and shortness of breath.   Cardiovascular: Negative for chest pain and palpitations.  Gastrointestinal: Positive for lower abdominal pain.  Negative for abdominal pain and vomiting.  Endocrine: Negative for polyphagia and polyuria.  Genitourinary: Positive for burning sensation on urination and decreased urination.  Musculoskeletal: Positive for back pain.  Negative for arthralgias  Skin: Negative for color change and rash.  Allergic/Immunologic: Negative for immunocompromised state.  Neurological: Negative for tremors, syncope, speech difficulty, weakness, light-headedness and headaches.  Hematological: Does not bruise/bleed easily.  All other systems reviewed and are negative   Past Medical History:  Diagnosis Date  . AAA (abdominal aortic aneurysm) ([place])   . AICD (automatic cardioverter/defibrillator) present 11/10/2017  . Arthritis    "some in my knees" (11/10/2017)  . Atrial fibrillation ([place])   . CHF (congestive heart failure) ([place])   . Chronic bronchitis ([place])   . COPD (chronic obstructive pulmonary disease) ([place])   . Depression   . GERD (gastroesophageal reflux disease)   . Gout    "  daily RX" (11/10/2017)  . History of hiatal hernia   . Hyperlipidemia   . Hypertension   . Hypothyroid   . Migraine headache    "hx; none since 1980s" (11/10/2017)  .  Osteopenia   . Pneumonia    "~ 3 times" (11/10/2017)  . PVD (peripheral vascular disease) ([place])    Past Surgical History:  Procedure Laterality Date  . ABDOMINAL AORTIC ENDOVASCULAR STENT GRAFT N/A 04/11/2013   Procedure: ABDOMINAL AORTIC ENDOVASCULAR STENT GRAFT WITH RIGHT FEMORAL PATCH ANGIOPLASTY;  Surgeon: Mal Misty, MD;  Location: [facility];  Service: Vascular;  Laterality: N/A;  . AV NODE ABLATION N/A 09/13/2018   Procedure: AV NODE ABLATION;  Surgeon: Evans Lance, MD;  Location: [facility];  Service: Cardiovascular;  Laterality: N/A;  . BIV ICD INSERTION CRT-D  11/10/2017  . BIV ICD INSERTION CRT-D N/A 11/10/2017   Procedure: BIV ICD INSERTION CRT-D;  Surgeon: Evans Lance, MD;  Location: [facility];  Service: Cardiovascular;  Laterality: N/A;  . BREAST SURGERY     LEFT BREAST BIOPSY  . CARDIAC CATHETERIZATION    . CATARACT EXTRACTION W/ INTRAOCULAR LENS  IMPLANT, BILATERAL Bilateral   . CHOLECYSTECTOMY OPEN  1972  . COLONOSCOPY    . EYE SURGERY Bilateral    "bleeding in my eyes"  . FRACTURE SURGERY    . TIBIA FRACTURE SURGERY Left 1970s  . TONSILLECTOMY    . VARICOSE VEIN SURGERY Bilateral    "laser"    Social History:  reports that she quit smoking about 17 years ago. Her smoking use included cigarettes. She has a 108.00 pack-year smoking history. She has never used smokeless tobacco. She reports that she does not drink alcohol and does not use drugs.   Allergies  Allergen Reactions  . Amiodarone Other (See Comments)    PULMONARY TOXICITY  . Diovan [Valsartan] Other (See Comments)    HYPOTENSION  . Doxycycline Diarrhea and Other (See Comments)    VISUAL DISTURBANCE  . Flexeril [Cyclobenzaprine] Other (See Comments)    FATIGUE  . Keflex [Cephalexin] Diarrhea  . Verapamil Other (See Comments)    EDEMA  . Codeine Hives    Family History  Problem Relation Age of Onset  . Cirrhosis Mother   . Cancer Mother 36       PANCREAS  . Heart defect  Sister   . Breast cancer Sister        age 78  . Heart disease Sister   . Stroke Sister   . Alcohol abuse Father   . Depression Father   . Hypertension Brother   . Hyperlipidemia Son   . Heart disease Daughter     Prior to Admission medications   Medication Sig Start Date End Date Taking? Authorizing Provider  acetaminophen (TYLENOL) 500 MG tablet Take 1,000 mg by mouth every 6 (six) hours as needed for mild pain or moderate pain.   Yes [provider]  albuterol (PROAIR HFA) 108 (90 Base) MCG/ACT inhaler Use 2 Inhalations 15 minutes Apart every 4 hours to Rescue Asthma Attack Patient taking differently: Inhale 2 puffs into the lungs See admin instructions. Use 2 inhalations 15 minutes apart every 4 hours as needed for asthma attack 07/29/18  Yes [name], MD  allopurinol (ZYLOPRIM) 300 MG tablet Take 0.5 tablets (150 mg total) by mouth daily. 09/20/19  Yes Vicie Mutters, PA-C  aspirin EC 81 MG tablet Take 81 mg by mouth daily.   Yes [provider]  budesonide-formoterol (SYMBICORT) 80-4.5 MCG/ACT inhaler Use 2 inhalations 30 minutes apart 2 x /day (every 12 hours) Patient taking differently: Inhale 2 puffs into the lungs daily.  08/28/19  Yes [name], MD  cetirizine (ZYRTEC) 10 MG tablet Take 10 mg by mouth daily as needed for allergies.    Yes [provider]  dexamethasone (DECADRON) 0.5 MG tablet Take 1 tablet PO TID for 3 days, then take 1 tablet PO BID for 3 days, then take 1 tablet PO for 5 days. Patient taking differently: Take 0.5 mg by mouth as directed. Take 1 tablet by mouth three times a day for 3 days, then take 1 tablet by mouth twice a day for 3 days, then take 1 tablet by mouth daily for 5 days. 09/20/19  Yes Vicie Mutters, PA-C  Eyelid Cleansers (AVENOVA) 0.01 % SOLN Apply 1 application topically in the morning and at bedtime. Wash eyelids twice daily with cleaner 11/28/17  Yes [provider]  ipratropium (ATROVENT) 0.06 %  nasal spray Use 1 to 2 sprays each Nostril 2 to 3 x / day as needed Patient taking differently: Place 1-2 sprays into both nostrils 3 (three) times daily as needed for rhinitis (congestion).  02/27/19 02/28/20 Yes [name], MD  ipratropium-albuterol (DUONEB) 0.5-2.5 (3) MG/3ML SOLN Inhale 3 mLs into the lungs every 6 (six) hours as needed (for shortness of breath or wheezing). 02/27/19  Yes [name], MD  L-Lysine 500 MG TABS Take 500 mg by mouth daily as needed.    Yes [provider]  leflunomide (ARAVA) 20 MG tablet Take 10 mg by mouth daily.  06/18/19  Yes [provider]  levothyroxine (SYNTHROID) 125 MCG tablet Take 1 tablet (125 mcg total) by mouth daily before breakfast. 08/18/19  Yes Adhikari, Amrit, MD  montelukast (SINGULAIR) 10 MG tablet TAKE 1 TABLET DAILY FOR ALLERGIES & ASTHMA Patient taking differently: Take 10 mg by mouth daily. for Allergies & Asthma 02/27/19  Yes [name], MD  OXYGEN Inhale 2 L into the lungs continuous.   Yes [provider]  Polyethyl Glycol-Propyl Glycol (SYSTANE) 0.4-0.3 % SOLN Place 1 drop into both eyes 4 (four) times daily as needed (for dry eyes).    Yes [provider]  potassium chloride SA (KLOR-CON) 20 MEQ tablet Take 20 mEq by mouth daily.    Yes [provider]  pravastatin (PRAVACHOL) 20 MG tablet Take 1 tablet at Bedtime for Cholesterol Patient taking differently: Take 10 mg by mouth every other day.  09/21/19  Yes [name], MD  pregabalin (LYRICA) 25 MG capsule Take 1 capsule 2 to 3 x /day as needed for Neuropathy pain Patient taking differently: Take 25 mg by mouth 3 (three) times daily. Take 1 capsule 2 to 3 x /day as needed for Neuropathy pain 08/30/19  Yes [name], MD  rivaroxaban (XARELTO) 20 MG TABS tablet Take 20 mg by mouth daily with supper.   Yes [provider]  torsemide (DEMADEX) 20 MG tablet Take 2 tablets (40 mg total) by mouth 2 (two) times  daily. Patient taking differently: Take 20-40 mg by mouth 2 (two) times daily.  08/28/18  Yes Evans Lance, MD    Physical Exam: BP (!) 140/114 (BP Location: Left Arm)   Pulse 72   Temp 97.8 F (36.6 C) (Oral)   Resp 18   Ht 5' 3.5" (1.613 m)   Wt 80 kg   SpO2 97%   BMI 30.75 kg/m   .  General: 79 y.o. year-old female well developed well nourished in no acute distress.  Alert and oriented x3. Marland Kitchen HEENT: NCAT, EOMI, PERRLA . Neck: Supple, trachea medial . Cardiovascular: Regular rate and rhythm with no rubs or gallops.  No thyromegaly or JVD noted.  No lower extremity edema. 2/4 pulses in all 4 extremities. Marland Kitchen Respiratory: Clear to auscultation with no wheezes or rales. Good inspiratory effort. . Abdomen: Soft, tender to palpation of suprapubic and LLQ.  Normal bowel sounds x4 quadrants. . Muskuloskeletal: No cyanosis, +2 edema to midshin bilaterally  . Neuro: CN II-XII intact, strength, sensation, reflexes . Skin: No ulcerative lesions noted or rashes . Psychiatry: Judgement and insight appear normal. Mood is appropriate for condition and setting          Labs on Admission:  Basic Metabolic Panel: Recent Labs  Lab 09/26/19 1423 09/28/19 1325  NA 139 134*  K 4.2 4.6  CL 101 96*  CO2 31 27  GLUCOSE 114* 97  BUN 48* 42*  CREATININE 1.41* 1.34*  CALCIUM 8.9 8.0*   Liver Function Tests: Recent Labs  Lab 09/26/19 1423  AST 12  ALT 17  BILITOT 0.7  PROT 5.1*   No results for input(s): LIPASE, AMYLASE in the last 168 hours. No results for input(s): AMMONIA in the last 168 hours. CBC: Recent Labs  Lab 09/26/19 1423 09/28/19 1325  WBC 11.9* 17.6*  NEUTROABS 9,175* 15.6*  HGB 8.6* 8.0*  HCT 27.5* 26.1*  MCV 90.2 91.6  PLT 258 254   Cardiac Enzymes: No results for input(s): CKTOTAL, CKMB, CKMBINDEX, TROPONINI in the last 168 hours.  BNP (last 3 results) Recent Labs    08/12/19 1349 08/13/19 0654  BNP 518.4* 713.5*    ProBNP (last 3 results) No results  for input(s): PROBNP in the last 8760 hours.  CBG: No results for input(s): GLUCAP in the last 168 hours.  Radiological Exams on Admission: CT Angio Abd/Pel W and/or Wo Contrast  Result Date: 09/28/2019 CLINICAL DATA:  Bilateral lower abdominal pain for several days, anemia EXAM: CTA ABDOMEN AND PELVIS WITHOUT AND WITH CONTRAST TECHNIQUE: Multidetector CT imaging of the abdomen and pelvis was performed using the standard protocol during bolus administration of intravenous contrast. Multiplanar reconstructed images and MIPs were obtained and reviewed to evaluate the vascular anatomy. CONTRAST:  157mL OMNIPAQUE IOHEXOL 350 MG/ML SOLN COMPARISON:  04/22/2015 FINDINGS: VASCULAR Aorta: There is fusiform dilatation of the distal thoracic aorta measuring up to 3.7 cm in diameter. There is evidence of prior endoluminal stent graft repair of the abdominal aorta, with no evidence of recurrence or residual aneurysm. There is diffuse atherosclerosis without flow limiting stenosis. No evidence of dissection. Celiac: Patent without evidence of aneurysm, dissection, vasculitis or significant stenosis. SMA: Patent without evidence of aneurysm, dissection, vasculitis or significant stenosis. Renals: Both renal arteries are patent without evidence of aneurysm, dissection, vasculitis, fibromuscular dysplasia or significant stenosis. IMA: The IMA is not identified, likely secondary to occlusion after endoluminal stent graft repair. Inflow: Moderate atherosclerosis of the bilateral internal and external iliac arteries. There is approximately 50% stenosis of the distal left common iliac artery just beyond the left iliac limb of the stent graft and just proximal to the left iliac bifurcation. No significant stenosis on the right. Proximal Outflow: Bilateral common femoral and visualized portions of the superficial and profunda femoral arteries are patent without evidence of aneurysm, dissection, vasculitis or significant stenosis.  Veins: No obvious venous abnormality within the limitations of this arterial phase study.  Review of the MIP images confirms the above findings. NON-VASCULAR Lower chest: Emphysema and bibasilar scarring. No acute pleural or parenchymal lung disease. The heart is enlarged, with marked right atrial dilatation. Hepatobiliary: No focal liver abnormality is seen. Status post cholecystectomy. No biliary dilatation. Pancreas: Unremarkable. No pancreatic ductal dilatation or surrounding inflammatory changes. Spleen: Normal in size without focal abnormality. Adrenals/Urinary Tract: Adrenal glands are unremarkable. Kidneys are normal, without renal calculi, focal lesion, or hydronephrosis. There is wall thickening along the left superior aspect of the bladder, likely due to adjacent inflammatory changes involving the sigmoid colon. Stomach/Bowel: No bowel obstruction or ileus. There is diverticulosis of the sigmoid colon, with wall thickening and pericolonic fat stranding involving the proximal sigmoid colon consistent with acute uncomplicated diverticulitis. There is no intraluminal contrast accumulation to suggest active gastrointestinal bleeding. Lymphatic: No pathologic adenopathy within the abdomen or pelvis. Reproductive: Uterus and bilateral adnexa are unremarkable. Other: There is fat stranding within the left lower quadrant most likely representing acute sigmoid diverticulitis. No free fluid or free gas. Musculoskeletal: No acute or destructive bony lesions. Reconstructed images demonstrate no additional findings. IMPRESSION: VASCULAR 1. No evidence of active gastrointestinal bleeding. 2. 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 3. Postsurgical changes from endoluminal stent graft repair of the abdominal aorta. No evidence of recurrence or residual aneurysm. No complication. 4. Diffuse atherosclerosis. There is focal stenosis in the distal left common iliac artery just proximal to  the bifurcation approaching 50%. No other critical stenoses are identified. NON-VASCULAR 1. Findings most consistent with acute sigmoid diverticulitis. No evidence of perforation, fluid collection, or abscess. Close follow-up is recommended to ensure resolution after appropriate medical management, and exclude underlying neoplasm. 2. Inflammatory changes within the left lower quadrant mesentery related to the diverticulitis described above. There is secondary inflammatory change along the left superior aspect of the bladder. 3. Aortic Atherosclerosis (ICD10-I70.0) and Emphysema (ICD10-J43.9). Electronically Signed   By: Randa Ngo M.D.   On: 09/28/2019 22:07    EKG: I independently viewed the EKG done and my findings are as followed: No EKG was done in the ED  Assessment/Plan Present on Admission: . Acute GI bleeding . Essential hypertension . Atrial fibrillation ([place]) . Atherosclerosis of native coronary artery of native heart without angina pectoris . Hypothyroidism . Hyperlipidemia, mixed  Principal Problem:   Acute GI bleeding Active Problems:   Essential hypertension   Atherosclerosis of native coronary artery of native heart without angina pectoris   Atrial fibrillation ([facility])   COPD (chronic obstructive pulmonary disease) ([facility])   Hypothyroidism   Hyperlipidemia, mixed   Diverticulitis   Symptomatic anemia   Acute lower UTI   Open wound of right foot   Leukocytosis   Dehydration  Symptomatic anemia possible secondary to acute GI bleeding Patient presents to the emergency department due to abnormal labs, she reported 3-day onset of black stool, stool guaiac test was positive by ED physician. Hemoglobin was 8.6 on arrival at the ED, repeat hemoglobin about 3.5 hours later was 8.0, 1 unit of PRBC infusion started in the ED. Continue IV Protonix drip Gastroenterologist (Dr. Laural Golden) was consulted and will see patient in the morning per ED physician  Abdominal pain secondary to  acute diverticulitis Continue IV Unasyn 300 mg every 6 hours Continue IV morphine 2 mg q.4h p.r.n. for moderate to severe pain Continue IV Zofran p.r.n. Continue clear liquid diet with plan to advance diet as tolerated Obtain blood culture x2  UTI POA Patient  complaining of dysuria and oliguria Continue IV Unasyn as described above Urine culture pending  Dehydration IV hydration provided in the ED  Leukocytosis possible secondary to steroid effect vs infectious WBC 17.6, patient was on steroid taper  Open wound (dorsum) of right foot Continue wound care  Essential hypertension Continue home torsemide when med rec is completed  Hyperlipidemia Continue home meds when med rec is updated  History of atherosclerosis of native coronary artery of native heart/atrial fibrillation CHA2Ds2-VASc score= 5 Xarelto will be held at this time due to GI bleed No rate control medication noted on med rec, we shall await updated med rec  Hypothyroidism Continue home Synthroid when med rec is updated  DVT prophylaxis: SCDs  Code Status: Full code  Family Communication: None at bedside  Disposition Plan:  Patient is from:                        home Anticipated DC to:                   SNF or family members home Anticipated DC date:               2-3 days Anticipated DC barriers:          Not stable to discharge considering acute GI bleed and acute diverticulitis.  Pending GI evaluation and further recommendation    Consults called: Gastroenterologist (Dr. Laural Golden) by ED physician  Admission status: Inpatient    Bernadette Hoit MD [facility]  If 7PM-7AM, please contact night-coverage [email or web address]  09/29/2019, 1:33 AM

## 2019-09-28 NOTE — ED Notes (Signed)
Assist Dr Mamie Nick with POC occult blood   Positive   Dr Mamie Nick  Threw card away

## 2019-09-28 NOTE — ED Notes (Signed)
To CT

## 2019-09-28 NOTE — ED Triage Notes (Addendum)
Pt reports was told by PCP to come to ED for evaluation related to low hemoglobin. Pt reports lower abd pain for last several days. Pt reports was originally seen at pcp for UTI related complaints. Pt reports "kidneys checked out and are fine, I just go very little when I pee." pt denies any rectal bleeding, dark stools, n/v/d, fever, shortness of breath. nad noted.

## 2019-09-29 ENCOUNTER — Encounter (HOSPITAL_COMMUNITY)
Admission: EM | Disposition: A | Discharge status: Home / Self Care | Payer: Self-pay | Source: Home / Self Care | Attending: Family Medicine

## 2019-09-29 ENCOUNTER — Encounter (HOSPITAL_COMMUNITY): Payer: Self-pay | Admitting: Internal Medicine

## 2019-09-29 DIAGNOSIS — K921 Melena: Secondary | ICD-10-CM

## 2019-09-29 DIAGNOSIS — K3189 Other diseases of stomach and duodenum: Secondary | ICD-10-CM

## 2019-09-29 DIAGNOSIS — I5042 Chronic combined systolic (congestive) and diastolic (congestive) heart failure: Secondary | ICD-10-CM

## 2019-09-29 DIAGNOSIS — D649 Anemia, unspecified: Secondary | ICD-10-CM

## 2019-09-29 DIAGNOSIS — Z7901 Long term (current) use of anticoagulants: Secondary | ICD-10-CM

## 2019-09-29 DIAGNOSIS — K229 Disease of esophagus, unspecified: Secondary | ICD-10-CM

## 2019-09-29 DIAGNOSIS — K259 Gastric ulcer, unspecified as acute or chronic, without hemorrhage or perforation: Secondary | ICD-10-CM

## 2019-09-29 DIAGNOSIS — N390 Urinary tract infection, site not specified: Secondary | ICD-10-CM

## 2019-09-29 DIAGNOSIS — K5792 Diverticulitis of intestine, part unspecified, without perforation or abscess without bleeding: Secondary | ICD-10-CM

## 2019-09-29 DIAGNOSIS — E860 Dehydration: Secondary | ICD-10-CM

## 2019-09-29 DIAGNOSIS — S91301A Unspecified open wound, right foot, initial encounter: Secondary | ICD-10-CM

## 2019-09-29 DIAGNOSIS — I482 Chronic atrial fibrillation, unspecified: Secondary | ICD-10-CM

## 2019-09-29 DIAGNOSIS — D72829 Elevated white blood cell count, unspecified: Secondary | ICD-10-CM

## 2019-09-29 DIAGNOSIS — K922 Gastrointestinal hemorrhage, unspecified: Secondary | ICD-10-CM

## 2019-09-29 DIAGNOSIS — K228 Other specified diseases of esophagus: Secondary | ICD-10-CM

## 2019-09-29 HISTORY — PX: ESOPHAGEAL BRUSHING: SHX6842

## 2019-09-29 HISTORY — PX: ESOPHAGOGASTRODUODENOSCOPY: SHX5428

## 2019-09-29 LAB — COMPREHENSIVE METABOLIC PANEL
ALT: 17 U/L (ref 0–44)
AST: 14 U/L — ABNORMAL LOW (ref 15–41)
Albumin: 2.7 g/dL — ABNORMAL LOW (ref 3.5–5.0)
Alkaline Phosphatase: 31 U/L — ABNORMAL LOW (ref 38–126)
Anion gap: 11 (ref 5–15)
BUN: 34 mg/dL — ABNORMAL HIGH (ref 8–23)
CO2: 25 mmol/L (ref 22–32)
Calcium: 7.7 mg/dL — ABNORMAL LOW (ref 8.9–10.3)
Chloride: 101 mmol/L (ref 98–111)
Creatinine, Ser: 1.15 mg/dL — ABNORMAL HIGH (ref 0.44–1.00)
GFR calc Af Amer: 53 mL/min — ABNORMAL LOW (ref >60)
GFR calc non Af Amer: 46 mL/min — ABNORMAL LOW (ref >60)
Glucose, Bld: 93 mg/dL (ref 70–99)
Potassium: 4.3 mmol/L (ref 3.5–5.1)
Sodium: 137 mmol/L (ref 135–145)
Total Bilirubin: 1.4 mg/dL — ABNORMAL HIGH (ref 0.3–1.2)
Total Protein: 5.3 g/dL — ABNORMAL LOW (ref 6.5–8.1)

## 2019-09-29 LAB — CBC
HCT: 26.7 % — ABNORMAL LOW (ref 36.0–46.0)
Hemoglobin: 8.5 g/dL — ABNORMAL LOW (ref 12.0–15.0)
MCH: 29 pg (ref 26.0–34.0)
MCHC: 31.8 g/dL (ref 30.0–36.0)
MCV: 91.1 fL (ref 80.0–100.0)
Platelets: 223 10*3/uL (ref 150–400)
RBC: 2.93 MIL/uL — ABNORMAL LOW (ref 3.87–5.11)
RDW: 18.6 % — ABNORMAL HIGH (ref 11.5–15.5)
WBC: 15.6 10*3/uL — ABNORMAL HIGH (ref 4.0–10.5)
nRBC: 0 % (ref 0.0–0.2)

## 2019-09-29 LAB — APTT: aPTT: 36 seconds (ref 24–36)

## 2019-09-29 LAB — TYPE AND SCREEN
ABO/RH(D): A POS
Antibody Screen: NEGATIVE
Unit division: 0

## 2019-09-29 LAB — PROTIME-INR
INR: 1.2 (ref 0.8–1.2)
Prothrombin Time: 15.1 seconds (ref 11.4–15.2)

## 2019-09-29 LAB — KOH PREP

## 2019-09-29 LAB — SARS CORONAVIRUS 2 BY RT PCR (HOSPITAL ORDER, PERFORMED IN ~~LOC~~ HOSPITAL LAB): SARS Coronavirus 2: NEGATIVE

## 2019-09-29 LAB — MAGNESIUM: Magnesium: 2.3 mg/dL (ref 1.7–2.4)

## 2019-09-29 LAB — BPAM RBC
Blood Product Expiration Date: 202108092359
ISSUE DATE / TIME: 202107162251
Unit Type and Rh: 6200

## 2019-09-29 LAB — PHOSPHORUS: Phosphorus: 3.9 mg/dL (ref 2.5–4.6)

## 2019-09-29 SURGERY — EGD (ESOPHAGOGASTRODUODENOSCOPY)
Anesthesia: Moderate Sedation

## 2019-09-29 MED ORDER — POTASSIUM CHLORIDE CRYS ER 20 MEQ PO TBCR
20.0000 meq | EXTENDED_RELEASE_TABLET | Freq: Every day | ORAL | Status: DC
  Administered 2019-09-30 – 2019-10-02 (×3): 20 meq via ORAL
  Filled 2019-09-29 (×4): qty 1

## 2019-09-29 MED ORDER — LEVOTHYROXINE SODIUM 25 MCG PO TABS
125.0000 ug | ORAL_TABLET | Freq: Every day | ORAL | Status: DC
  Administered 2019-09-29 – 2019-10-02 (×4): 125 ug via ORAL
  Filled 2019-09-29 (×4): qty 1

## 2019-09-29 MED ORDER — MIDAZOLAM HCL 5 MG/5ML IJ SOLN
INTRAMUSCULAR | Status: AC
  Filled 2019-09-29: qty 10

## 2019-09-29 MED ORDER — MONTELUKAST SODIUM 10 MG PO TABS
10.0000 mg | ORAL_TABLET | Freq: Every day | ORAL | Status: DC
  Administered 2019-09-29 – 2019-10-02 (×4): 10 mg via ORAL
  Filled 2019-09-29 (×4): qty 1

## 2019-09-29 MED ORDER — METRONIDAZOLE IN NACL 5-0.79 MG/ML-% IV SOLN: 500.0000 mg | Freq: Three times a day (TID) | INTRAVENOUS | Status: DC

## 2019-09-29 MED ORDER — ATORVASTATIN CALCIUM 20 MG PO TABS
20.0000 mg | ORAL_TABLET | Freq: Every day | ORAL | Status: DC
  Administered 2019-09-29 – 2019-10-02 (×4): 20 mg via ORAL
  Filled 2019-09-29 (×4): qty 1

## 2019-09-29 MED ORDER — NYSTATIN 100000 UNIT/ML MT SUSP
5.0000 mL | Freq: Four times a day (QID) | OROMUCOSAL | Status: DC
  Administered 2019-09-29 – 2019-10-02 (×13): 500000 [IU] via ORAL
  Filled 2019-09-29 (×14): qty 5

## 2019-09-29 MED ORDER — ASPIRIN EC 81 MG PO TBEC
81.0000 mg | DELAYED_RELEASE_TABLET | Freq: Every day | ORAL | Status: DC
  Administered 2019-09-30 – 2019-10-02 (×3): 81 mg via ORAL
  Filled 2019-09-29 (×3): qty 1

## 2019-09-29 MED ORDER — MORPHINE SULFATE (PF) 2 MG/ML IV SOLN
2.0000 mg | INTRAVENOUS | Status: DC | PRN
  Administered 2019-09-29 – 2019-09-30 (×8): 2 mg via INTRAVENOUS
  Filled 2019-09-29 (×9): qty 1

## 2019-09-29 MED ORDER — SODIUM CHLORIDE 0.9 % IV SOLN
INTRAVENOUS | Status: DC
  Administered 2019-09-29: 20 mL/h via INTRAVENOUS

## 2019-09-29 MED ORDER — PANTOPRAZOLE SODIUM 40 MG PO TBEC
40.0000 mg | DELAYED_RELEASE_TABLET | Freq: Two times a day (BID) | ORAL | Status: DC
  Administered 2019-09-29 – 2019-10-02 (×7): 40 mg via ORAL
  Filled 2019-09-29 (×8): qty 1

## 2019-09-29 MED ORDER — MEPERIDINE HCL 50 MG/ML IJ SOLN
INTRAMUSCULAR | Status: DC | PRN
  Administered 2019-09-29: 20 mg via INTRAVENOUS

## 2019-09-29 MED ORDER — MIDAZOLAM HCL 5 MG/5ML IJ SOLN
INTRAMUSCULAR | Status: DC | PRN
  Administered 2019-09-29: 2 mg via INTRAVENOUS

## 2019-09-29 MED ORDER — SODIUM CHLORIDE 0.9 % IV SOLN
3.0000 g | Freq: Four times a day (QID) | INTRAVENOUS | Status: DC
  Administered 2019-09-29 – 2019-10-02 (×14): 3 g via INTRAVENOUS
  Filled 2019-09-29 (×6): qty 8
  Filled 2019-09-29 (×4): qty 3
  Filled 2019-09-29 (×2): qty 8
  Filled 2019-09-29: qty 3
  Filled 2019-09-29: qty 8
  Filled 2019-09-29: qty 3
  Filled 2019-09-29 (×3): qty 8
  Filled 2019-09-29: qty 3

## 2019-09-29 MED ORDER — MEPERIDINE HCL 50 MG/ML IJ SOLN
INTRAMUSCULAR | Status: AC
  Filled 2019-09-29: qty 1

## 2019-09-29 MED ORDER — SODIUM CHLORIDE 0.9 % IV SOLN
8.0000 mg/h | INTRAVENOUS | Status: AC
  Administered 2019-09-29: 8 mg/h via INTRAVENOUS
  Filled 2019-09-29 (×7): qty 80

## 2019-09-29 MED ORDER — LIDOCAINE VISCOUS HCL 2 % MT SOLN
OROMUCOSAL | Status: DC | PRN
  Administered 2019-09-29: 5 mL via OROMUCOSAL

## 2019-09-29 MED ORDER — MORPHINE SULFATE (PF) 2 MG/ML IV SOLN
2.0000 mg | Freq: Once | INTRAVENOUS | Status: AC
  Administered 2019-09-29: 2 mg via INTRAVENOUS
  Filled 2019-09-29: qty 1

## 2019-09-29 MED ORDER — AVENOVA 0.01 % EX SOLN: 1.0000 "application " | Freq: Two times a day (BID) | CUTANEOUS | Status: DC

## 2019-09-29 MED ORDER — FLUCONAZOLE 150 MG PO TABS
150.0000 mg | ORAL_TABLET | Freq: Once | ORAL | Status: AC
  Administered 2019-09-29: 150 mg via ORAL
  Filled 2019-09-29: qty 1

## 2019-09-29 MED ORDER — IPRATROPIUM-ALBUTEROL 0.5-2.5 (3) MG/3ML IN SOLN: 3.0000 mL | Freq: Four times a day (QID) | RESPIRATORY_TRACT | Status: DC | PRN

## 2019-09-29 MED ORDER — POLYVINYL ALCOHOL 1.4 % OP SOLN: 1.0000 [drp] | Freq: Four times a day (QID) | OPHTHALMIC | Status: DC | PRN

## 2019-09-29 MED ORDER — LIDOCAINE VISCOUS HCL 2 % MT SOLN
OROMUCOSAL | Status: AC
  Filled 2019-09-29: qty 15

## 2019-09-29 MED ORDER — TORSEMIDE 20 MG PO TABS
20.0000 mg | ORAL_TABLET | ORAL | Status: DC
  Administered 2019-09-29: 20 mg via ORAL
  Filled 2019-09-29: qty 1

## 2019-09-29 MED ORDER — TORSEMIDE 20 MG PO TABS
40.0000 mg | ORAL_TABLET | ORAL | Status: DC
  Administered 2019-09-29: 40 mg via ORAL
  Filled 2019-09-29: qty 2

## 2019-09-29 MED ORDER — ACETAMINOPHEN 500 MG PO TABS
1000.0000 mg | ORAL_TABLET | Freq: Four times a day (QID) | ORAL | Status: DC | PRN
  Administered 2019-09-29 – 2019-09-30 (×5): 1000 mg via ORAL
  Filled 2019-09-29 (×5): qty 2

## 2019-09-29 MED ORDER — ALBUTEROL SULFATE HFA 108 (90 BASE) MCG/ACT IN AERS: 2.0000 | INHALATION_SPRAY | RESPIRATORY_TRACT | Status: DC

## 2019-09-29 MED ORDER — SODIUM CHLORIDE 0.9 % IV SOLN: Freq: Once | INTRAVENOUS | Status: DC

## 2019-09-29 MED ORDER — PANTOPRAZOLE SODIUM 40 MG IV SOLR
40.0000 mg | Freq: Once | INTRAVENOUS | Status: AC
  Administered 2019-09-29: 40 mg via INTRAVENOUS
  Filled 2019-09-29: qty 40

## 2019-09-29 NOTE — Progress Notes (Signed)
Brief EGD note.  Patchy whitish exudate at proximal esophagus suspicious for mild Candida esophagitis.  Brushing taken for KOH prep. Esophageal mucosa otherwise normal. 3 mm ulcer at gastric antrum to ulcer lesser curvature without stigmata of bleed.  Multiple antral erosions. Normal bulbar and post bulbar mucosa.

## 2019-09-29 NOTE — Op Note (Signed)
[facility] Patient Name: Katie [name] Procedure Date: 09/29/2019 11:49 AM MRN: [id number] Date of Birth: Jun 21, 1940 Attending MD: Hildred Laser , MD CSN: [id number] Age: 79 Admit Type: Inpatient Procedure:                Upper GI endoscopy Indications:              Melena Providers:                Hildred Laser, MD, Gwynneth Albright RN, RN,                            Charlsie Quest Theda Sers RN, RN Referring MD:             [name], MD and Roxan Hockey, MD Medicines:                Lidocaine spray, Meperidine 20 mg IV, Midazolam 2                            mg IV Complications:            No immediate complications. Estimated Blood Loss:     Estimated blood loss: none. Procedure:                Pre-Anesthesia Assessment:                           - Prior to the procedure, a History and Physical                            was performed, and patient medications and                            allergies were reviewed. The patient's tolerance of                            previous anesthesia was also reviewed. The risks                            and benefits of the procedure and the sedation                            options and risks were discussed with the patient.                            All questions were answered, and informed consent                            was obtained. Prior Anticoagulants: The patient                            last took Xarelto (rivaroxaban) 2 days prior to the                            procedure. ASA Grade Assessment: III - A patient  with severe systemic disease. After reviewing the                            risks and benefits, the patient was deemed in                            satisfactory condition to undergo the procedure.                           After obtaining informed consent, the endoscope was                            passed under direct vision. Throughout the                            procedure,  the patient's blood pressure, pulse, and                            oxygen saturations were monitored continuously. The                            GIF-H190 ([id number]) scope was introduced through the                            mouth, and advanced to the second part of duodenum.                            The upper GI endoscopy was accomplished without                            difficulty. The patient tolerated the procedure                            well. Scope In: 12:28:44 PM Scope Out: 12:35:47 PM Total Procedure Duration: 0 hours 7 minutes 3 seconds  Findings:      The hypopharynx was normal.      Patchy, white plaques were found in the proximal esophagus. Cells for       cytology were obtained by brushing.      The exam of the esophagus was otherwise normal.      The Z-line was irregular and was found 33 cm from the incisors.      The cardia, gastric fundus, gastric body and pylorus were normal.      A few erosions with no stigmata of recent bleeding were found in the       gastric antrum.      One non-bleeding cratered gastric ulcer with no stigmata of bleeding was       found on the lesser curvature of the gastric antrum. The lesion was 3 mm       in largest dimension.      The duodenal bulb and second portion of the duodenum were normal. Impression:               - Normal hypopharynx.                           -  Esophageal plaques were found, suspicious for                            candidiasis. Cells for cytology obtained.                           - Z-line irregular, 33 cm from the incisors.                           - Normal cardia, gastric fundus, gastric body and                            pylorus.                           - Erosive gastropathy with no stigmata of recent                            bleeding.                           - Non-bleeding gastric ulcer with no stigmata of                            bleeding.                           - Normal duodenal bulb and  second portion of the                            duodenum. Moderate Sedation:      Moderate (conscious) sedation was administered by the endoscopy nurse       and supervised by the endoscopist. The following parameters were       monitored: oxygen saturation, heart rate, blood pressure, CO2       capnography and response to care. Total physician intraservice time was       12 minutes. Recommendation:           - Return patient to hospital ward for ongoing care.                           - Clear liquid diet today.                           - Continue present medications.                           - Change PPI to oral route when infusion runs out.                           - Mycostatin suspension 500000 units swish and                            swallow gid.                           - H. Pylori serology .                           -  Colonoscopy in 4weeks- CBC in am. Procedure Code(s):        --- Professional ---                           [phone], Esophagogastroduodenoscopy, flexible,                            transoral; diagnostic, including collection of                            specimen(s) by brushing or washing, when performed                            (separate procedure)                           G0500, Moderate sedation services provided by the                            same physician or other qualified health care                            professional performing a gastrointestinal                            endoscopic service that sedation supports,                            requiring the presence of an independent trained                            observer to assist in the monitoring of the                            patient's level of consciousness and physiological                            status; initial 15 minutes of intra-service time;                            patient age 51 years or older (additional time may                            be reported with [id number], as  appropriate) Diagnosis Code(s):        --- Professional ---                           K22.9, Disease of esophagus, unspecified                           K22.8, Other specified diseases of esophagus                           K31.89, Other diseases of stomach and duodenum  K25.9, Gastric ulcer, unspecified as acute or                            chronic, without hemorrhage or perforation                           K92.1, Melena (includes Hematochezia) CPT copyright 2019 [employer]. All rights reserved. The codes documented in this report are preliminary and upon coder review may  be revised to meet current compliance requirements. Hildred Laser, MD Hildred Laser, MD 09/29/2019 12:55:03 PM This report has been signed electronically. Number of Addenda: 0

## 2019-09-29 NOTE — Progress Notes (Signed)
Patient Demographics:    Katie [name], is a 79 y.o. female, DOB - 04-22-1940, CHE:[id number]  Admit date - 09/28/2019   Admitting Physician Bernadette Hoit, DO  Outpatient Primary MD for the patient is [name], MD  LOS - 1   Chief Complaint  Patient presents with  . Abnormal Lab        Subjective:    Katie [name] today has no fevers, no emesis,  No chest pain,   -Patient with dark stools, dyspnea on exertion and dizziness in the setting of symptomatic anemia with drop in H&H requiring transfusion of PRBCs  Assessment  & Plan :    Principal Problem:   Acute GI bleeding Active Problems:   Essential hypertension   Atherosclerosis of native coronary artery of native heart without angina pectoris   Atrial fibrillation (HCC)   COPD (chronic obstructive pulmonary disease) (HCC)   Hypothyroidism   Hyperlipidemia, mixed   Diverticulitis   Symptomatic anemia   Acute lower UTI   Open wound of right foot   Leukocytosis   Dehydration  Brief Summary:- - 79 y.o. female with medical history significant for HTN, HLD, ASHD/cAfib, chCHF with a BiVent AICD, CKD3,Prediabetes, Hypothyroidism, Gout , Rheumatoid Arthritisand Vitamin D Deficiencyadmitted on 09/28/2019 with concerns about acute blood loss anemia with hemoglobin dropping from 8.6-11.1 and also finding of diverticulitis Patchy whitish exudate at proximal esophagus suspicious for mild Candida esophagitis.  Brushing taken for KOH prep. Esophageal mucosa otherwise normal. 3 mm ulcer at gastric antrum to ulcer lesser curvature without stigmata of bleed.  Multiple antral erosions.   A/p 1) acute diverticulitis--IV Unasyn-ordered, as needed morphine Zofran -Liquid diet for now -Leukocytosis may persist due to steroid therapy  2)Candidal esophagitis and peptic ulcer disease--EGD on 09/29/2019 consistent with Candida esophagitis as well as  ulcer at the gastric antrum to the lesser curvature multiple antral erosions-- -GI consult from Dr. Melony Overly appreciated continue Diflucan and Protonix  3) symptomatic anemia secondary to acute blood loss from GI source--- hemoglobin is down to 8.6 from a baseline of above 11 usually--- transfuse 1 unit of PRBC on 09/28/2019  4) chronic atrial fibrillation--- hold Xarelto as per GI physician on 2 10/01/2019 -PTA patient was not on any AV nodal blocking agents  5)CAD--continue aspirin and Lipitor  6) hypothyroidism--- continue levothyroxine  Disposition/Need for in-Hospital Stay- patient unable to be discharged at this time due to - dyspnea on exertion and dizziness in the setting of symptomatic anemia with drop in H&H requiring transfusion of PRBCs *  Status is: Inpatient  Remains inpatient appropriate because:dyspnea on exertion and dizziness in the setting of symptomatic anemia with drop in H&H requiring transfusion of PRBCs   Disposition: The patient is from: Home              Anticipated d/c is to: Home              Anticipated d/c date is: 1 day              Patient currently is not medically stable to d/c. Barriers: Not Clinically Stable- -dyspnea on exertion and dizziness in the setting of symptomatic anemia with drop in H&H requiring transfusion of PRBCs  Code Status : full  Procedure:- EGD Patchy  whitish exudate at proximal esophagus suspicious for mild Candida esophagitis.  Brushing taken for KOH prep. Esophageal mucosa otherwise normal. 3 mm ulcer at gastric antrum to ulcer lesser curvature without stigmata of bleed.  Multiple antral erosions.  Family Communication:    (patient is alert, awake and coherent)   Consults  :  Gi  DVT Prophylaxis  :  - SCDs   Lab Results  Component Value Date   PLT 223 09/29/2019    Inpatient Medications  Scheduled Meds: . levothyroxine  125 mcg Oral Q0600  . montelukast  10 mg Oral Daily  . nystatin  5 mL Oral QID  . pantoprazole   40 mg Oral BID AC  . potassium chloride SA  20 mEq Oral Daily  . torsemide  20 mg Oral Q24H  . torsemide  40 mg Oral Q24H   Continuous Infusions: . ampicillin-sulbactam (UNASYN) IV 3 g (09/29/19 1553)   PRN Meds:.acetaminophen, ipratropium-albuterol, morphine injection, ondansetron **OR** ondansetron (ZOFRAN) IV, polyvinyl alcohol    Anti-infectives (From admission, onward)   Start     Dose/Rate Route Frequency Ordered Stop   09/29/19 0200  Ampicillin-Sulbactam (UNASYN) 3 g in sodium chloride 0.9 % 100 mL IVPB     Discontinue     3 g 200 mL/hr over 30 Minutes Intravenous Every 6 hours 09/29/19 0126     09/29/19 0115  metroNIDAZOLE (FLAGYL) IVPB 500 mg  Status:  Discontinued        500 mg 100 mL/hr over 60 Minutes Intravenous Every 8 hours 09/29/19 0108 09/29/19 0126        Objective:   Vitals:   09/29/19 1250 09/29/19 1255 09/29/19 1300 09/29/19 1353  BP: 92/60 (!) 95/46  (!) 111/51  Pulse: 69 71 76 68  Resp:  16 18 18   Temp:    97.7 F (36.5 C)  TempSrc:    Oral  SpO2: 99% 98% 98%   Weight:      Height:        Wt Readings from Last 3 Encounters:  09/28/19 80 kg  09/26/19 80.9 kg  09/20/19 77.1 kg     Intake/Output Summary (Last 24 hours) at 09/29/2019 1911 Last data filed at 09/29/2019 1700 Gross per 24 hour  Intake 1594.17 ml  Output 350 ml  Net 1244.17 ml     Physical Exam  Gen:- Awake Alert,  In no apparent distress  HEENT:- [place].AT, No sclera icterus Neck-Supple Neck,No JVD,.  Lungs-  CTAB , fair symmetrical air movement CV- S1, S2 normal, irregular  Abd-  +ve B.Sounds, Abd Soft, lower abdominal tenderness without rebound or guarding Extremity/Skin:- No  edema, pedal pulses present  Psych-affect is appropriate, oriented x3 Neuro-no new focal deficits, no tremors   Data Review:   Micro Results Recent Results (from the past 240 hour(s))  Urine Culture     Status: None   Collection Time: 09/26/19  2:23 PM   Specimen: Urine  Result Value Ref Range  Status   MICRO NUMBER: [id number]  Final   SPECIMEN QUALITY: Adequate  Final   Sample Source URINE  Final   STATUS: FINAL  Final   ISOLATE 1:   Final    Growth of mixed flora was isolated, suggesting probable contamination. No further testing will be performed. If clinically indicated, recollection using a method to minimize contamination, with prompt transfer to Urine Culture Transport Tube, is  recommended.   SARS Coronavirus 2 by RT PCR (hospital order, performed in [facility] hospital lab) Nasopharyngeal  Nasopharyngeal Swab     Status: None   Collection Time: 09/28/19 11:30 PM   Specimen: Nasopharyngeal Swab  Result Value Ref Range Status   SARS Coronavirus 2 NEGATIVE NEGATIVE Final    Comment: (NOTE) SARS-CoV-2 target nucleic acids are NOT DETECTED.  The SARS-CoV-2 RNA is generally detectable in upper and lower respiratory specimens during the acute phase of infection. The lowest concentration of SARS-CoV-2 viral copies this assay can detect is 250 copies / mL. A negative result does not preclude SARS-CoV-2 infection and should not be used as the sole basis for treatment or other patient management decisions.  A negative result may occur with improper specimen collection / handling, submission of specimen other than nasopharyngeal swab, presence of viral mutation(s) within the areas targeted by this assay, and inadequate number of viral copies (<250 copies / mL). A negative result must be combined with clinical observations, patient history, and epidemiological information.  Fact Sheet for Patients:   StrictlyIdeas.no  Fact Sheet for Healthcare Providers: BankingDealers.co.za  This test is not yet approved or  cleared by the [place] FDA and has been authorized for detection and/or diagnosis of SARS-CoV-2 by FDA under an Emergency Use Authorization (EUA).  This EUA will remain in effect (meaning this test can be used) for  the duration of the COVID-19 declaration under Section 564(b)(1) of the Act, 21 U.S.C. section 360bbb-3(b)(1), unless the authorization is terminated or revoked sooner.  Performed at [facility], [address] [place], [place] [address]   Culture, blood (Routine X 2) w Reflex to ID Panel     Status: None (Preliminary result)   Collection Time: 09/29/19  1:36 AM   Specimen: Left Antecubital; Blood  Result Value Ref Range Status   Specimen Description LEFT ANTECUBITAL  Final   Special Requests   Final    BOTTLES DRAWN AEROBIC AND ANAEROBIC Blood Culture adequate volume   Culture   Final    NO GROWTH < 12 HOURS Performed at [facility], [address] [place], [place] [address]    Report Status PENDING  Incomplete  Culture, blood (Routine X 2) w Reflex to ID Panel     Status: None (Preliminary result)   Collection Time: 09/29/19  6:16 AM   Specimen: BLOOD LEFT WRIST  Result Value Ref Range Status   Specimen Description BLOOD LEFT WRIST BOTTLES DRAWN AEROBIC ONLY  Final   Special Requests Blood Culture adequate volume  Final   Culture   Final    NO GROWTH <12 HOURS Performed at [facility], [address] [place], [place] [address]    Report Status PENDING  Incomplete  KOH prep     Status: None   Collection Time: 09/29/19 12:33 PM   Specimen: Bronchial Brush  Result Value Ref Range Status   Specimen Description BRONCHIAL BRUSHING  Final   Special Requests NONE  Final   KOH Prep   Final    FEW YEAST Performed at [facility], [address] [place], [place] [address]    Report Status 09/29/2019 FINAL  Final    Radiology Reports VAS [place] ABI WITH/WO TBI  Result Date: 09/14/2019 LOWER EXTREMITY DOPPLER STUDY Indications: Recent fall Wound right anterior calf/foot. High Risk Factors: Hypertension, hyperlipidemia, past history of smoking. Other Factors: AFIB.  Vascular Interventions: EVAR with stent graft and right femoral angioplasty                         04/11/2013.  Performing Technologist: Alvia Grove RVT  Examination Guidelines: A complete evaluation includes at minimum, Doppler waveform signals and systolic blood pressure reading at the level of bilateral brachial, anterior tibial, and posterior tibial arteries, when vessel segments are accessible. Bilateral testing is considered an integral part of a complete examination. Photoelectric Plethysmograph (PPG) waveforms and toe systolic pressure readings are included as required and additional duplex testing as needed. Limited examinations for reoccurring indications may be performed as noted.  ABI Findings: +---------+------------------+-----+---------+--------+ Right    Rt Pressure (mmHg)IndexWaveform Comment  +---------+------------------+-----+---------+--------+ Brachial 157                                      +---------+------------------+-----+---------+--------+ PTA      179               1.14 triphasic         +---------+------------------+-----+---------+--------+ DP       175               1.11 triphasic         +---------+------------------+-----+---------+--------+ Great Toe126               0.80 Normal            +---------+------------------+-----+---------+--------+ +---------+------------------+-----+---------+-------+ Left     Lt Pressure (mmHg)IndexWaveform Comment +---------+------------------+-----+---------+-------+ Brachial 155                                     +---------+------------------+-----+---------+-------+ PTA      163               1.04 triphasic        +---------+------------------+-----+---------+-------+ DP       167               1.06 triphasic        +---------+------------------+-----+---------+-------+ Great Toe115               0.73 Normal           +---------+------------------+-----+---------+-------+ +-------+-----------+-----------+------------+------------+ ABI/TBIToday's ABIToday's TBIPrevious ABIPrevious TBI  +-------+-----------+-----------+------------+------------+ Right  1.14       0.80       1.18        0.84         +-------+-----------+-----------+------------+------------+ Left   1.06       0.73       1.08        0.79         +-------+-----------+-----------+------------+------------+  Summary: Right: Resting right ankle-brachial index is within normal range. No evidence of significant right lower extremity arterial disease. The right toe-brachial index is normal. Left: Resting left ankle-brachial index is within normal range. No evidence of significant left lower extremity arterial disease. The left toe-brachial index is normal.  *See table(s) above for measurements and observations.  Electronically signed by Servando Snare MD on 09/14/2019 at 11:40:50 AM.    Final    DG HIP UNILAT W OR W/O PELVIS 2-3 VIEWS LEFT  Result Date: 09/21/2019 CLINICAL DATA:  Left hip pain for 3 months EXAM: DG HIP (WITH OR WITHOUT PELVIS) 2-3V LEFT COMPARISON:  Pelvic radiographs dated 08/13/2019 FINDINGS: There is no evidence of hip fracture or dislocation. Moderate degenerative changes are seen in the left hip and the spine. Bilateral iliac stents are noted. IMPRESSION: Moderate degenerative changes in the left hip and spine. Electronically  Signed   By: Zerita Boers M.D.   On: 09/21/2019 10:50   CT Angio Abd/Pel W and/or Wo Contrast  Result Date: 09/28/2019 CLINICAL DATA:  Bilateral lower abdominal pain for several days, anemia EXAM: CTA ABDOMEN AND PELVIS WITHOUT AND WITH CONTRAST TECHNIQUE: Multidetector CT imaging of the abdomen and pelvis was performed using the standard protocol during bolus administration of intravenous contrast. Multiplanar reconstructed images and MIPs were obtained and reviewed to evaluate the vascular anatomy. CONTRAST:  192mL OMNIPAQUE IOHEXOL 350 MG/ML SOLN COMPARISON:  04/22/2015 FINDINGS: VASCULAR Aorta: There is fusiform dilatation of the distal thoracic aorta measuring up to 3.7 cm in  diameter. There is evidence of prior endoluminal stent graft repair of the abdominal aorta, with no evidence of recurrence or residual aneurysm. There is diffuse atherosclerosis without flow limiting stenosis. No evidence of dissection. Celiac: Patent without evidence of aneurysm, dissection, vasculitis or significant stenosis. SMA: Patent without evidence of aneurysm, dissection, vasculitis or significant stenosis. Renals: Both renal arteries are patent without evidence of aneurysm, dissection, vasculitis, fibromuscular dysplasia or significant stenosis. IMA: The IMA is not identified, likely secondary to occlusion after endoluminal stent graft repair. Inflow: Moderate atherosclerosis of the bilateral internal and external iliac arteries. There is approximately 50% stenosis of the distal left common iliac artery just beyond the left iliac limb of the stent graft and just proximal to the left iliac bifurcation. No significant stenosis on the right. Proximal Outflow: Bilateral common femoral and visualized portions of the superficial and profunda femoral arteries are patent without evidence of aneurysm, dissection, vasculitis or significant stenosis. Veins: No obvious venous abnormality within the limitations of this arterial phase study. Review of the MIP images confirms the above findings. NON-VASCULAR Lower chest: Emphysema and bibasilar scarring. No acute pleural or parenchymal lung disease. The heart is enlarged, with marked right atrial dilatation. Hepatobiliary: No focal liver abnormality is seen. Status post cholecystectomy. No biliary dilatation. Pancreas: Unremarkable. No pancreatic ductal dilatation or surrounding inflammatory changes. Spleen: Normal in size without focal abnormality. Adrenals/Urinary Tract: Adrenal glands are unremarkable. Kidneys are normal, without renal calculi, focal lesion, or hydronephrosis. There is wall thickening along the left superior aspect of the bladder, likely due to  adjacent inflammatory changes involving the sigmoid colon. Stomach/Bowel: No bowel obstruction or ileus. There is diverticulosis of the sigmoid colon, with wall thickening and pericolonic fat stranding involving the proximal sigmoid colon consistent with acute uncomplicated diverticulitis. There is no intraluminal contrast accumulation to suggest active gastrointestinal bleeding. Lymphatic: No pathologic adenopathy within the abdomen or pelvis. Reproductive: Uterus and bilateral adnexa are unremarkable. Other: There is fat stranding within the left lower quadrant most likely representing acute sigmoid diverticulitis. No free fluid or free gas. Musculoskeletal: No acute or destructive bony lesions. Reconstructed images demonstrate no additional findings. IMPRESSION: VASCULAR 1. No evidence of active gastrointestinal bleeding. 2. 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 3. Postsurgical changes from endoluminal stent graft repair of the abdominal aorta. No evidence of recurrence or residual aneurysm. No complication. 4. Diffuse atherosclerosis. There is focal stenosis in the distal left common iliac artery just proximal to the bifurcation approaching 50%. No other critical stenoses are identified. NON-VASCULAR 1. Findings most consistent with acute sigmoid diverticulitis. No evidence of perforation, fluid collection, or abscess. Close follow-up is recommended to ensure resolution after appropriate medical management, and exclude underlying neoplasm. 2. Inflammatory changes within the left lower quadrant mesentery related to the diverticulitis described above. There is secondary inflammatory change along  the left superior aspect of the bladder. 3. Aortic Atherosclerosis (ICD10-I70.0) and Emphysema (ICD10-J43.9). Electronically Signed   By: Randa Ngo M.D.   On: 09/28/2019 22:07     CBC Recent Labs  Lab 09/26/19 1423 09/28/19 1325 09/29/19 0616  WBC 11.9* 17.6* 15.6*   HGB 8.6* 8.0* 8.5*  HCT 27.5* 26.1* 26.7*  PLT 258 254 223  MCV 90.2 91.6 91.1  MCH 28.2 28.1 29.0  MCHC 31.3* 30.7 31.8  RDW 16.2* 19.6* 18.6*  LYMPHSABS 1,666 0.9  --   MONOABS  --  1.0  --   EOSABS 119 0.0  --   BASOSABS 24 0.0  --     Chemistries  Recent Labs  Lab 09/26/19 1423 09/28/19 1325 09/29/19 0616  NA 139 134* 137  K 4.2 4.6 4.3  CL 101 96* 101  CO2 31 27 25   GLUCOSE 114* 97 93  BUN 48* 42* 34*  CREATININE 1.41* 1.34* 1.15*  CALCIUM 8.9 8.0* 7.7*  MG  --   --  2.3  AST 12  --  14*  ALT 17  --  17  ALKPHOS  --   --  31*  BILITOT 0.7  --  1.4*   ------------------------------------------------------------------------------------------------------------------ No results for input(s): CHOL, HDL, LDLCALC, TRIG, CHOLHDL, LDLDIRECT in the last 72 hours.  Lab Results  Component Value Date   HGBA1C 5.2 08/27/2019   ------------------------------------------------------------------------------------------------------------------ No results for input(s): TSH, T4TOTAL, T3FREE, THYROIDAB in the last 72 hours.  Invalid input(s): FREET3 ------------------------------------------------------------------------------------------------------------------ No results for input(s): VITAMINB12, FOLATE, FERRITIN, TIBC, IRON, RETICCTPCT in the last 72 hours.  Coagulation profile Recent Labs  Lab 09/28/19 1325 09/29/19 0616  INR 1.5* 1.2    No results for input(s): DDIMER in the last 72 hours.  Cardiac Enzymes No results for input(s): CKMB, TROPONINI, MYOGLOBIN in the last 168 hours.  Invalid input(s): CK ------------------------------------------------------------------------------------------------------------------    Component Value Date/Time   BNP 713.5 (H) 08/13/2019 0654   BNP 158 (H) 05/04/2018 1122     Roxan Hockey M.D on 09/29/2019 at 7:11 PM  Go to [email or web address] - for contact info  [facility] - Office  [phone]

## 2019-09-29 NOTE — Consult Note (Signed)
Referring Provider: Roxan Hockey, MD Primary Care Physician:  [name], MD Primary Gastroenterologist:  Dr. Laural Golden  Reason for Consultation:    Melena and anemia.  HPI:   Patient is 79 year old Caucasian female with multiple medical problems including history of CHF, COPD peripheral vascular disease, status post abdominal aortic endovascular stent graft placement, thoracic aortic aneurysm chronic atrial fibrillation on Xarelto/rivaroxaban and low-dose aspirin who also has AICD who has not felt well since she fell 5 steps on 08/11/2019 and sustained large ecchymosis involving the right side of her body.  She also had altered mental status on that presentation but did not have any evidence of intracranial bleed or fractures.  Altered mental status was felt to be multifactorial due to acute kidney injury and her medications.  Patient gradually improved and was discharged on 08/18/2019. Patient reports having lower abdominal pain on the evening of 09/25/2019.  She was concerned that she may have urinary tract infection.  She was seen by Dr. [name] her primary care physician on 09/26/2019.  Surprisingly she was not having abdominal pain at the time.  Urine analysis was unremarkable.  He obtained a CBC.  She was noted to have a hemoglobin of 8.6 g.  6 days earlier had been 11.1 g.  Patient states abdominal pain 2 days ago was severe and doubled her over.  No history of fever or chills.  Patient was therefore advised to come to emergency room which she did yesterday.  Was noted to have WBC of 17.6 and H&H was 8.0 and 26.1.  She had CTA abdomen and pelvis which did not reveal any abnormality to suggest aortoenteric fistula but she had changes of sigmoid diverticulitis. She was noted to have black heme positive stool by Dr. Davonna Belling. Patient states she has had black stools for 1 week.  She denies epigastric pain nausea or vomiting.  She experiences heartburn once in a while.  She had been  on famotidine which he stopped thinking that was causing her black stools.  There is no history of peptic ulcer disease.  Patient says she has not had good appetite since her fall.  She states she has lost 9 pounds but she believes weight loss is voluntary because she has been trying to do so. She says she has been constipated since she started to have pain 5 days ago.  She states over the last couple days she had to take her stool out..  No history of rectal bleeding.  Her last colonoscopy was about 6 years ago for screening purposes and reportedly was normal. She also complains of chronic low back pain and gives history of sciatica few years ago. Patient's last dose of Xarelto/rivaroxaban was on the night of 09/27/2019.   Patient is married.  She worked in [place] for 30 years in [employer] for 15 years and retired in 2004.  She has never drank alcohol.  She smoked about 2 packs of cigarettes daily until she quit in 2004.  She states she smoked for over 40 years.  She has 3 children.  Her son is 43 years old and he has hypertension.  She has 2 daughters and they both have health issues.  Her daughter age 53 has coronary artery disease and underwent CABG.  She also has been treated for diverticulitis.  Her other daughter developed Covid in March last year and developed hearing loss.  She states her hearing impairment is significant and so far has not improved.  She does not  know anything about her biological father.  Her mother died of pancreatic carcinoma at age 90.  She left 3 years.  Stepfather died of throat cancer which was diagnosed at age 55 and he died at 14.  Her sister died of breast carcinoma at age 51.  She was diagnosed at age 12.  She has 1 brother living who is 78 years old and has a pacemaker.  She lost her brother in auto accident in his 67s and her other brother died in a fire accident while he was driving a truck.  He was 78 years old.   Past Medical History:  Diagnosis Date  .  AAA (abdominal aortic aneurysm) ([place])   . AICD (automatic cardioverter/defibrillator) present 11/10/2017  . Arthritis    "some in my knees" (11/10/2017)  . Atrial fibrillation ([place])   . CHF (congestive heart failure) ([place])   . Chronic bronchitis ([place])   . COPD (chronic obstructive pulmonary disease) ([place])   . Depression   . GERD (gastroesophageal reflux disease)   . Gout    "daily RX" (11/10/2017)  . History of hiatal hernia   . Hyperlipidemia   . Hypertension   . Hypothyroid   . Migraine headache    "hx; none since 1980s" (11/10/2017)  . Osteopenia   . Pneumonia    "~ 3 times" (11/10/2017)  . PVD (peripheral vascular disease) ([place])     Past Surgical History:  Procedure Laterality Date  . ABDOMINAL AORTIC ENDOVASCULAR STENT GRAFT N/A 04/11/2013   Procedure: ABDOMINAL AORTIC ENDOVASCULAR STENT GRAFT WITH RIGHT FEMORAL PATCH ANGIOPLASTY;  Surgeon: Mal Misty, MD;  Location: [facility];  Service: Vascular;  Laterality: N/A;  . AV NODE ABLATION N/A 09/13/2018   Procedure: AV NODE ABLATION;  Surgeon: Evans Lance, MD;  Location: [facility];  Service: Cardiovascular;  Laterality: N/A;  . BIV ICD INSERTION CRT-D  11/10/2017  . BIV ICD INSERTION CRT-D N/A 11/10/2017   Procedure: BIV ICD INSERTION CRT-D;  Surgeon: Evans Lance, MD;  Location: [facility];  Service: Cardiovascular;  Laterality: N/A;  . BREAST SURGERY     LEFT BREAST BIOPSY  . CARDIAC CATHETERIZATION    . CATARACT EXTRACTION W/ INTRAOCULAR LENS  IMPLANT, BILATERAL Bilateral   . CHOLECYSTECTOMY OPEN  1972  . COLONOSCOPY    . EYE SURGERY Bilateral    "bleeding in my eyes"  . FRACTURE SURGERY    . TIBIA FRACTURE SURGERY Left 1970s  . TONSILLECTOMY    . VARICOSE VEIN SURGERY Bilateral    "laser"    Prior to Admission medications   Medication Sig Start Date End Date Taking? Authorizing Provider  acetaminophen (TYLENOL) 500 MG tablet Take 1,000 mg by mouth every 6 (six) hours as needed for mild pain or  moderate pain.   Yes [provider]  albuterol (PROAIR HFA) 108 (90 Base) MCG/ACT inhaler Use 2 Inhalations 15 minutes Apart every 4 hours to Rescue Asthma Attack Patient taking differently: Inhale 2 puffs into the lungs See admin instructions. Use 2 inhalations 15 minutes apart every 4 hours as needed for asthma attack 07/29/18  Yes [name], MD  allopurinol (ZYLOPRIM) 300 MG tablet Take 0.5 tablets (150 mg total) by mouth daily. 09/20/19  Yes Vicie Mutters, PA-C  aspirin EC 81 MG tablet Take 81 mg by mouth daily.   Yes [provider]  budesonide-formoterol (SYMBICORT) 80-4.5 MCG/ACT inhaler Use 2 inhalations 30 minutes apart 2 x /day (every 12 hours) Patient taking  differently: Inhale 2 puffs into the lungs daily.  08/28/19  Yes [name], MD  cetirizine (ZYRTEC) 10 MG tablet Take 10 mg by mouth daily as needed for allergies.    Yes [provider]  dexamethasone (DECADRON) 0.5 MG tablet Take 1 tablet PO TID for 3 days, then take 1 tablet PO BID for 3 days, then take 1 tablet PO for 5 days. Patient taking differently: Take 0.5 mg by mouth as directed. Take 1 tablet by mouth three times a day for 3 days, then take 1 tablet by mouth twice a day for 3 days, then take 1 tablet by mouth daily for 5 days. 09/20/19  Yes Vicie Mutters, PA-C  Eyelid Cleansers (AVENOVA) 0.01 % SOLN Apply 1 application topically in the morning and at bedtime. Wash eyelids twice daily with cleaner 11/28/17  Yes [provider]  ipratropium (ATROVENT) 0.06 % nasal spray Use 1 to 2 sprays each Nostril 2 to 3 x / day as needed Patient taking differently: Place 1-2 sprays into both nostrils 3 (three) times daily as needed for rhinitis (congestion).  02/27/19 02/28/20 Yes [name], MD  ipratropium-albuterol (DUONEB) 0.5-2.5 (3) MG/3ML SOLN Inhale 3 mLs into the lungs every 6 (six) hours as needed (for shortness of breath or wheezing). 02/27/19  Yes [name], MD   L-Lysine 500 MG TABS Take 500 mg by mouth daily as needed.    Yes [provider]  leflunomide (ARAVA) 20 MG tablet Take 10 mg by mouth daily.  06/18/19  Yes [provider]  levothyroxine (SYNTHROID) 125 MCG tablet Take 1 tablet (125 mcg total) by mouth daily before breakfast. 08/18/19  Yes Adhikari, Amrit, MD  montelukast (SINGULAIR) 10 MG tablet TAKE 1 TABLET DAILY FOR ALLERGIES & ASTHMA Patient taking differently: Take 10 mg by mouth daily. for Allergies & Asthma 02/27/19  Yes [name], MD  OXYGEN Inhale 2 L into the lungs continuous.   Yes [provider]  Polyethyl Glycol-Propyl Glycol (SYSTANE) 0.4-0.3 % SOLN Place 1 drop into both eyes 4 (four) times daily as needed (for dry eyes).    Yes [provider]  potassium chloride SA (KLOR-CON) 20 MEQ tablet Take 20 mEq by mouth daily.    Yes [provider]  pravastatin (PRAVACHOL) 20 MG tablet Take 1 tablet at Bedtime for Cholesterol Patient taking differently: Take 10 mg by mouth every other day.  09/21/19  Yes [name], MD  pregabalin (LYRICA) 25 MG capsule Take 1 capsule 2 to 3 x /day as needed for Neuropathy pain Patient taking differently: Take 25 mg by mouth 3 (three) times daily. Take 1 capsule 2 to 3 x /day as needed for Neuropathy pain 08/30/19  Yes [name], MD  rivaroxaban (XARELTO) 20 MG TABS tablet Take 20 mg by mouth daily with supper.   Yes [provider]  torsemide (DEMADEX) 20 MG tablet Take 2 tablets (40 mg total) by mouth 2 (two) times daily. Patient taking differently: Take 20-40 mg by mouth 2 (two) times daily.  08/28/18  Yes Evans Lance, MD    Current Facility-Administered Medications  Medication Dose Route Frequency Provider Last Rate Last Admin  . acetaminophen (TYLENOL) tablet 1,000 mg  1,000 mg Oral Q6H PRN Adefeso, Oladapo, DO   1,000 mg at 09/29/19 6195  . Ampicillin-Sulbactam (UNASYN) 3 g in sodium chloride 0.9 % 100 mL IVPB  3 g  Intravenous Q6H Adefeso, Oladapo, DO 200 mL/hr at 09/29/19 0946 3 g at 09/29/19 0946  .  ipratropium-albuterol (DUONEB) 0.5-2.5 (3) MG/3ML nebulizer solution 3 mL  3 mL Inhalation Q6H PRN Adefeso, Oladapo, DO      . levothyroxine (SYNTHROID) tablet 125 mcg  125 mcg Oral Q0600 Adefeso, Oladapo, DO   125 mcg at 09/29/19 5397  . montelukast (SINGULAIR) tablet 10 mg  10 mg Oral Daily Adefeso, Oladapo, DO   10 mg at 09/29/19 0940  . morphine 2 MG/ML injection 2 mg  2 mg Intravenous Q4H PRN Adefeso, Oladapo, DO   2 mg at 09/29/19 0924  . ondansetron (ZOFRAN) tablet 4 mg  4 mg Oral Q6H PRN Adefeso, Oladapo, DO       Or  . ondansetron (ZOFRAN) injection 4 mg  4 mg Intravenous Q6H PRN Adefeso, Oladapo, DO   4 mg at 09/29/19 0053  . pantoprazole (PROTONIX) 80 mg in sodium chloride 0.9 % 100 mL (0.8 mg/mL) infusion  8 mg/hr Intravenous Continuous Adefeso, Oladapo, DO 10 mL/hr at 09/29/19 0317 8 mg/hr at 09/29/19 0317  . polyvinyl alcohol (LIQUIFILM TEARS) 1.4 % ophthalmic solution 1 drop  1 drop Both Eyes QID PRN Adefeso, Oladapo, DO      . potassium chloride SA (KLOR-CON) CR tablet 20 mEq  20 mEq Oral Daily Adefeso, Oladapo, DO      . torsemide (DEMADEX) tablet 20 mg  20 mg Oral Q24H Adefeso, Oladapo, DO      . torsemide (DEMADEX) tablet 40 mg  40 mg Oral Q24H Adefeso, Oladapo, DO   40 mg at 09/29/19 0941    Allergies as of 09/28/2019 - Review Complete 09/28/2019  Allergen Reaction Noted  . Amiodarone Other (See Comments) 01/03/2013  . Diovan [valsartan] Other (See Comments) 01/03/2013  . Doxycycline Diarrhea and Other (See Comments) 01/03/2013  . Flexeril [cyclobenzaprine] Other (See Comments) 01/03/2013  . Keflex [cephalexin] Diarrhea 01/03/2013  . Verapamil Other (See Comments) 01/03/2013  . Codeine Hives 11/22/2008    Family History  Problem Relation Age of Onset  . Cirrhosis Mother   . Cancer Mother 42       PANCREAS  . Heart defect Sister   . Breast cancer Sister        age 70  . Heart  disease Sister   . Stroke Sister   . Alcohol abuse Father   . Depression Father   . Hypertension Brother   . Hyperlipidemia Son   . Heart disease Daughter     Social History   Socioeconomic History  . Marital status: Married    Spouse name: Not on file  . Number of children: Not on file  . Years of education: Not on file  . Highest education level: Not on file  Occupational History  . Not on file  Tobacco Use  . Smoking status: Former Smoker    Packs/day: 2.00    Years: 54.00    Pack years: 108.00    Types: Cigarettes    Quit date: 08/10/2002    Years since quitting: 17.1  . Smokeless tobacco: Never Used  Vaping Use  . Vaping Use: Never used  Substance and Sexual Activity  . Alcohol use: Never  . Drug use: Never  . Sexual activity: Not Currently    Birth control/protection: Post-menopausal  Other Topics Concern  . Not on file  Social History Narrative   Lives in [place] with husband   One daughter   Social Determinants of Health   Financial Resource Strain: Low Risk   . Difficulty of Paying Living Expenses: Not hard at all  Food Insecurity: No Food Insecurity  . Worried About Charity fundraiser in the Last Year: Never true  . Ran Out of Food in the Last Year: Never true  Transportation Needs: No Transportation Needs  . Lack of Transportation (Medical): No  . Lack of Transportation (Non-Medical): No  Physical Activity: Inactive  . Days of Exercise per Week: 0 days  . Minutes of Exercise per Session: 0 min  Stress: No Stress Concern Present  . Feeling of Stress : Not at all  Social Connections: Socially Integrated  . Frequency of Communication with Friends and Family: More than three times a week  . Frequency of Social Gatherings with Friends and Family: More than three times a week  . Attends Religious Services: More than 4 times per year  . Active Member of Clubs or Organizations: Yes  . Attends Archivist Meetings: More than 4 times per year   . Marital Status: Married  Human resources officer Violence: Not At Risk  . Fear of Current or Ex-Partner: No  . Emotionally Abused: No  . Physically Abused: No  . Sexually Abused: No    Review of Systems: See HPI, otherwise normal ROS  Physical Exam: Temp:  [97.7 F (36.5 C)-99.4 F (37.4 C)] 97.7 F (36.5 C) (07/17 0456) Pulse Rate:  [69-75] 69 (07/17 0456) Resp:  [18-20] 18 (07/17 0150) BP: (118-141)/(45-114) 118/45 (07/17 0456) SpO2:  [77 %-100 %] 100 % (07/17 0456) Weight:  [80 kg] 80 kg (07/16 1255) Last BM Date: 09/28/19   Patient is alert and in no acute distress.  She is sitting in reclining chair. Conjunctiva is pale.  Sclerae nonicteric. Oropharyngeal mucosa is dry otherwise normal.   She has upper and lower dentures in place. Neck without masses thyromegaly or lymphadenopathy. Cardiac exam with regular rhythm normal S1 and S2.  She has grade 3/6 systolic ejection murmur heard at aortic area as well as left sternal border. Auscultation lungs reveal vesicular breath sounds bilaterally.  No rales or rhonchi. Abdomen is full.  She has upper midline scar curving around right side of umbilicus and another scar below the right costal margin which she had a drain. Bowel sounds are normal.  No bruit noted.  Abdomen is soft with mild tenderness in hypogastric region without guarding or rebound.  Liver edge is easily palpable along the costal margin.  It is nontender.  Spleen is not palpable.  She does not have clubbing or koilonychia. She has 1-2+ pitting edema involving both legs as well as ulcer involving crease at right ankle anteriorly covered with OpSite.  Ulcer was examined and there is no odor or drainage.   Intake/Output from previous day: 07/16 0701 - 07/17 0700 In: 692.2 [I.V.:37.2; Blood:655] Out: -  Intake/Output this shift: No intake/output data recorded.  Lab Results: Recent Labs    09/26/19 1423 09/28/19 1325 09/29/19 0616  WBC 11.9* 17.6* 15.6*  HGB 8.6*  8.0* 8.5*  HCT 27.5* 26.1* 26.7*  PLT 258 254 223   BMET Recent Labs    09/26/19 1423 09/28/19 1325 09/29/19 0616  NA 139 134* 137  K 4.2 4.6 4.3  CL 101 96* 101  CO2 31 27 25   GLUCOSE 114* 97 93  BUN 48* 42* 34*  CREATININE 1.41* 1.34* 1.15*  CALCIUM 8.9 8.0* 7.7*   LFT Recent Labs    09/29/19 0616  PROT 5.3*  ALBUMIN 2.7*  AST 14*  ALT 17  ALKPHOS 31*  BILITOT 1.4*   PT/INR Recent  Labs    09/28/19 1325 09/29/19 0616  LABPROT 17.3* 15.1  INR 1.5* 1.2   Hepatitis Panel No results for input(s): HEPBSAG, HCVAB, HEPAIGM, HEPBIGM in the last 72 hours.  Studies/Results: CT Angio Abd/Pel W and/or Wo Contrast  Result Date: 09/28/2019 CLINICAL DATA:  Bilateral lower abdominal pain for several days, anemia EXAM: CTA ABDOMEN AND PELVIS WITHOUT AND WITH CONTRAST TECHNIQUE: Multidetector CT imaging of the abdomen and pelvis was performed using the standard protocol during bolus administration of intravenous contrast. Multiplanar reconstructed images and MIPs were obtained and reviewed to evaluate the vascular anatomy. CONTRAST:  113mL OMNIPAQUE IOHEXOL 350 MG/ML SOLN COMPARISON:  04/22/2015 FINDINGS: VASCULAR Aorta: There is fusiform dilatation of the distal thoracic aorta measuring up to 3.7 cm in diameter. There is evidence of prior endoluminal stent graft repair of the abdominal aorta, with no evidence of recurrence or residual aneurysm. There is diffuse atherosclerosis without flow limiting stenosis. No evidence of dissection. Celiac: Patent without evidence of aneurysm, dissection, vasculitis or significant stenosis. SMA: Patent without evidence of aneurysm, dissection, vasculitis or significant stenosis. Renals: Both renal arteries are patent without evidence of aneurysm, dissection, vasculitis, fibromuscular dysplasia or significant stenosis. IMA: The IMA is not identified, likely secondary to occlusion after endoluminal stent graft repair. Inflow: Moderate atherosclerosis of  the bilateral internal and external iliac arteries. There is approximately 50% stenosis of the distal left common iliac artery just beyond the left iliac limb of the stent graft and just proximal to the left iliac bifurcation. No significant stenosis on the right. Proximal Outflow: Bilateral common femoral and visualized portions of the superficial and profunda femoral arteries are patent without evidence of aneurysm, dissection, vasculitis or significant stenosis. Veins: No obvious venous abnormality within the limitations of this arterial phase study. Review of the MIP images confirms the above findings. NON-VASCULAR Lower chest: Emphysema and bibasilar scarring. No acute pleural or parenchymal lung disease. The heart is enlarged, with marked right atrial dilatation. Hepatobiliary: No focal liver abnormality is seen. Status post cholecystectomy. No biliary dilatation. Pancreas: Unremarkable. No pancreatic ductal dilatation or surrounding inflammatory changes. Spleen: Normal in size without focal abnormality. Adrenals/Urinary Tract: Adrenal glands are unremarkable. Kidneys are normal, without renal calculi, focal lesion, or hydronephrosis. There is wall thickening along the left superior aspect of the bladder, likely due to adjacent inflammatory changes involving the sigmoid colon. Stomach/Bowel: No bowel obstruction or ileus. There is diverticulosis of the sigmoid colon, with wall thickening and pericolonic fat stranding involving the proximal sigmoid colon consistent with acute uncomplicated diverticulitis. There is no intraluminal contrast accumulation to suggest active gastrointestinal bleeding. Lymphatic: No pathologic adenopathy within the abdomen or pelvis. Reproductive: Uterus and bilateral adnexa are unremarkable. Other: There is fat stranding within the left lower quadrant most likely representing acute sigmoid diverticulitis. No free fluid or free gas. Musculoskeletal: No acute or destructive bony  lesions. Reconstructed images demonstrate no additional findings. IMPRESSION: VASCULAR 1. No evidence of active gastrointestinal bleeding. 2. 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 3. Postsurgical changes from endoluminal stent graft repair of the abdominal aorta. No evidence of recurrence or residual aneurysm. No complication. 4. Diffuse atherosclerosis. There is focal stenosis in the distal left common iliac artery just proximal to the bifurcation approaching 50%. No other critical stenoses are identified. NON-VASCULAR 1. Findings most consistent with acute sigmoid diverticulitis. No evidence of perforation, fluid collection, or abscess. Close follow-up is recommended to ensure resolution after appropriate medical management, and exclude underlying neoplasm. 2.  Inflammatory changes within the left lower quadrant mesentery related to the diverticulitis described above. There is secondary inflammatory change along the left superior aspect of the bladder. 3. Aortic Atherosclerosis (ICD10-I70.0) and Emphysema (ICD10-J43.9). Electronically Signed   By: Randa Ngo M.D.   On: 09/28/2019 22:07   I have reviewed CT images.  She has wall thickening to sigmoid colon with pericolonic stranding consistent with diverticulitis.  She also has wall thickening to superior aspect of blood on the left side possibly reactive changes to diverticulitis. There is distal thoracic aortic aneurysm measuring 3.5 cm and endoluminal stent grafts in the abdominal aorta extending to iliacs.  No wall irregularity or extravasation of the contrast.   Assessment;  Patient is 79 year old Caucasian female with multiple medical problems on Xarelto/rivaroxaban and low-dose aspirin who presents with 1 week history of melena and 4-day history lower abdominal pain.  Patient noted to be anemic.  She has received 1 unit of PRBCs.  Work-up also reveals sigmoid diverticulitis which would explain lower  abdominal pain and leukocytosis.  She is on Unasyn and abdominal examination is benign.  She had pain medication about 1 hour ago.  She has following GI issues.   #1.  Melena.  No prior history of peptic ulcer disease.  Patient is at risk for peptic ulcer disease given that she is on low-dose aspirin and has rheumatoid arthritis.  She is stable to undergo diagnostic EGD.  Patient is on pantoprazole infusion.  There are no CT findings to suggest aortoenteric fistula.  #2.  Sigmoid: Diverticulitis.  Patient is on Unasyn and seems to be improving.  Her last colonoscopy was approximately 6 years ago and was reportedly normal.  I cannot find colonoscopy records in our system.    #3.  Anemia.  Hemoglobin 9 days ago was 11.1 g and on presentation 8 g.  Dry weight appears to be due to GI blood loss.  She has received 1 unit of PRBCs and hemoglobin has come up to 8.5 g.    Recommendations;  Diagnostic esophagogastroduodenoscopy with therapeutic intention later today. Continue pantoprazole infusion for now. Diagnostic colonoscopy at a later date when patient is fully recovered from diverticulitis.   LOS: 1 day   Json Koelzer  09/29/2019, 10:48 AM

## 2019-09-29 NOTE — Progress Notes (Signed)
Have increased oxygen from 1 to 3 for low saturation not sure if low reading is correct

## 2019-09-30 DIAGNOSIS — B3781 Candidal esophagitis: Secondary | ICD-10-CM

## 2019-09-30 LAB — SAR COV2 SEROLOGY (COVID19)AB(IGG),IA: SARS-CoV-2 Ab, IgG: REACTIVE — AB

## 2019-09-30 LAB — CBC
HCT: 25.4 % — ABNORMAL LOW (ref 36.0–46.0)
Hemoglobin: 7.8 g/dL — ABNORMAL LOW (ref 12.0–15.0)
MCH: 28.3 pg (ref 26.0–34.0)
MCHC: 30.7 g/dL (ref 30.0–36.0)
MCV: 92 fL (ref 80.0–100.0)
Platelets: 216 10*3/uL (ref 150–400)
RBC: 2.76 MIL/uL — ABNORMAL LOW (ref 3.87–5.11)
RDW: 18.6 % — ABNORMAL HIGH (ref 11.5–15.5)
WBC: 12.7 10*3/uL — ABNORMAL HIGH (ref 4.0–10.5)
nRBC: 0 % (ref 0.0–0.2)

## 2019-09-30 LAB — URINE CULTURE: Culture: <10000 — AB

## 2019-09-30 MED ORDER — OXYCODONE HCL 5 MG PO TABS
5.0000 mg | ORAL_TABLET | ORAL | Status: DC | PRN
  Administered 2019-10-01 (×2): 5 mg via ORAL
  Filled 2019-09-30 (×2): qty 1

## 2019-09-30 MED ORDER — CYCLOSPORINE 0.05 % OP EMUL
1.0000 [drp] | Freq: Two times a day (BID) | OPHTHALMIC | Status: DC
  Administered 2019-10-01 – 2019-10-02 (×2): 1 [drp] via OPHTHALMIC
  Filled 2019-09-30 (×4): qty 1

## 2019-09-30 MED ORDER — HYDROMORPHONE HCL 1 MG/ML IJ SOLN
1.0000 mg | INTRAMUSCULAR | Status: DC | PRN
  Administered 2019-09-30 – 2019-10-01 (×3): 1 mg via INTRAVENOUS
  Filled 2019-09-30 (×3): qty 1

## 2019-09-30 MED ORDER — FUROSEMIDE 10 MG/ML IJ SOLN
20.0000 mg | Freq: Once | INTRAMUSCULAR | Status: AC
  Administered 2019-09-30: 20 mg via INTRAVENOUS
  Filled 2019-09-30: qty 2

## 2019-09-30 NOTE — Progress Notes (Signed)
Subjective:  Patient says she had a large black stool yesterday afternoon but none thereafter.  She says lower abdominal pain is getting better.  She now rates it at 5.  He is getting relief with pain medication.  Patient denies shortness of breath.  She did not experience change in pain with her breakfast this morning.   Current Facility-Administered Medications:  .  acetaminophen (TYLENOL) tablet 1,000 mg, 1,000 mg, Oral, Q6H PRN, Adefeso, Oladapo, DO, 1,000 mg at 09/30/19 0259 .  Ampicillin-Sulbactam (UNASYN) 3 g in sodium chloride 0.9 % 100 mL IVPB, 3 g, Intravenous, Q6H, Adefeso, Oladapo, DO, Last Rate: 200 mL/hr at 09/30/19 0917, 3 g at 09/30/19 0917 .  aspirin EC tablet 81 mg, 81 mg, Oral, Q breakfast, Emokpae, Courage, MD, 81 mg at 09/30/19 0907 .  atorvastatin (LIPITOR) tablet 20 mg, 20 mg, Oral, Daily, Emokpae, Courage, MD, 20 mg at 09/30/19 0908 .  ipratropium-albuterol (DUONEB) 0.5-2.5 (3) MG/3ML nebulizer solution 3 mL, 3 mL, Inhalation, Q6H PRN, Adefeso, Oladapo, DO .  levothyroxine (SYNTHROID) tablet 125 mcg, 125 mcg, Oral, Q0600, Adefeso, Oladapo, DO, 125 mcg at 09/30/19 0547 .  montelukast (SINGULAIR) tablet 10 mg, 10 mg, Oral, Daily, Adefeso, Oladapo, DO, 10 mg at 09/30/19 0908 .  morphine 2 MG/ML injection 2 mg, 2 mg, Intravenous, Q4H PRN, Adefeso, Oladapo, DO, 2 mg at 09/30/19 0907 .  nystatin (MYCOSTATIN) 100000 UNIT/ML suspension 500,000 Units, 5 mL, Oral, QID, Chandrika Sandles, Mechele Dawley, MD, 500,000 Units at 09/30/19 0907 .  ondansetron (ZOFRAN) tablet 4 mg, 4 mg, Oral, Q6H PRN **OR** ondansetron (ZOFRAN) injection 4 mg, 4 mg, Intravenous, Q6H PRN, Adefeso, Oladapo, DO, 4 mg at 09/29/19 0053 .  pantoprazole (PROTONIX) EC tablet 40 mg, 40 mg, Oral, BID AC, Junior Kenedy U, MD, 40 mg at 09/30/19 0549 .  polyvinyl alcohol (LIQUIFILM TEARS) 1.4 % ophthalmic solution 1 drop, 1 drop, Both Eyes, QID PRN, Adefeso, Oladapo, DO .  potassium chloride SA (KLOR-CON) CR tablet 20 mEq, 20 mEq,  Oral, Daily, Adefeso, Oladapo, DO, 20 mEq at 09/30/19 0907   Objective: Blood pressure (!) 117/53, pulse 70, temperature 97.7 F (36.5 C), temperature source Oral, resp. rate 16, height 5' 3.5" (1.613 m), weight 80 kg, SpO2 95 %. Patient is alert and in no acute distress. Abdomen is full.  Bowel sounds are normal.  On palpation abdomen is soft.  She remains with mild tenderness in hypogastric area and left lower quadrant which she guards some.  No rebound.  No organomegaly or masses. No LE edema or clubbing noted.  Labs/studies Results:  CBC Latest Ref Rng & Units 09/30/2019 09/29/2019 09/28/2019  WBC 4.0 - 10.5 K/uL 12.7(H) 15.6(H) 17.6(H)  Hemoglobin 12.0 - 15.0 g/dL 7.8(L) 8.5(L) 8.0(L)  Hematocrit 36 - 46 % 25.4(L) 26.7(L) 26.1(L)  Platelets 150 - 400 K/uL 216 223 254    CMP Latest Ref Rng & Units 09/29/2019 09/28/2019 09/26/2019  Glucose 70 - 99 mg/dL 93 97 114(H)  BUN 8 - 23 mg/dL 34(H) 42(H) 48(H)  Creatinine 0.44 - 1.00 mg/dL 1.15(H) 1.34(H) 1.41(H)  Sodium 135 - 145 mmol/L 137 134(L) 139  Potassium 3.5 - 5.1 mmol/L 4.3 4.6 4.2  Chloride 98 - 111 mmol/L 101 96(L) 101  CO2 22 - 32 mmol/L '25 27 31  ' Calcium 8.9 - 10.3 mg/dL 7.7(L) 8.0(L) 8.9  Total Protein 6.5 - 8.1 g/dL 5.3(L) - 5.1(L)  Total Bilirubin 0.3 - 1.2 mg/dL 1.4(H) - 0.7  Alkaline Phos 38 - 126 U/L 31(L) - -  AST 15 - 41 U/L 14(L) - 12  ALT 0 - 44 U/L 17 - 17    Hepatic Function Latest Ref Rng & Units 09/29/2019 09/26/2019 09/20/2019  Total Protein 6.5 - 8.1 g/dL 5.3(L) 5.1(L) 5.6(L)  Albumin 3.5 - 5.0 g/dL 2.7(L) - -  AST 15 - 41 U/L 14(L) 12 15  ALT 0 - 44 U/L 17 17 41(H)  Alk Phosphatase 38 - 126 U/L 31(L) - -  Total Bilirubin 0.3 - 1.2 mg/dL 1.4(H) 0.7 1.7(H)  Bilirubin, Direct 0.0 - 0.2 mg/dL - - -    Esophageal KOH prep positive for Candida  H. pylori serology is pending.  Assessment:  #1.  GI bleed.  Patient presented with melena.  She underwent esophagogastroduodenoscopy yesterday revealing small gastric  ulcer with clean base along with scattered erosions.  No stigmata of bleed.  She could have bled from ulcer erosions in the setting of anticoagulation. She did have melanic stool yesterday.  Hemodynamically she appears to be stable.  Doubt that she is actively bleeding.  Hemoglobin has dropped by less than 1 g since yesterday.  #2.  Anemia secondary to GI bleed.  Patient has received 1 unit of PRBCs.  #3.  Sigmoid diverticulitis.  Patient is on Unasyn and is slowly improving.  Diet advanced.  Still with significant pain.  She has received 3 doses of morphine since midnight.  #4.  History of atrial fibrillation.  Anticoagulation on hold because of GI bleed.  #5.  History of CHF.  Patient appears to be in compensated state.  She is on diuretic therapy.  #6.  Mild Candida esophagitis noted on EGD yesterday.  Patient is being treated with Mycostatin suspension.  Recommendations  Diet advanced. CBC in a.m. If she continues improving H&H is stable she may be able to go home tomorrow. She will return for colonoscopy in 4 weeks or so.

## 2019-09-30 NOTE — Progress Notes (Signed)
Patient Demographics:    Katie [name], is a 79 y.o. female, DOB - 1941/01/30, HRC:[id number]  Admit date - 09/28/2019   Admitting Physician Bernadette Hoit, DO  Outpatient Primary MD for the patient is [name], MD  LOS - 2   Chief Complaint  Patient presents with  . Abnormal Lab        Subjective:    Katie [name] today has no fevers, No chest pain,   - Patient continues to have abdominal pain rated at 7/10 improved to 5/10 after pain medication Continues to have melanotic stools and nausea but no emesis-hemoglobin is down to 7.8  Assessment  & Plan :    Principal Problem:   Acute GI bleeding Active Problems:   Essential hypertension   Atherosclerosis of native coronary artery of native heart without angina pectoris   Atrial fibrillation (HCC)   COPD (chronic obstructive pulmonary disease) (HCC)   Hypothyroidism   Hyperlipidemia, mixed   Diverticulitis   Symptomatic anemia   Acute lower UTI   Open wound of right foot   Leukocytosis   Dehydration  Brief Summary:- - 79 y.o. female with medical history significant for HTN, HLD, ASHD/cAfib, chCHF with a BiVent AICD, CKD3,Prediabetes, Hypothyroidism, Gout , Rheumatoid Arthritisand Vitamin D Deficiencyadmitted on 09/28/2019 with concerns about acute blood loss anemia with hemoglobin dropping from 8.6-11.1 and also finding of diverticulitis Patchy whitish exudate at proximal esophagus suspicious for mild Candida esophagitis.  Brushing taken for KOH prep. Esophageal mucosa otherwise normal. 3 mm ulcer at gastric antrum to ulcer lesser curvature without stigmata of bleed.  Multiple antral erosions.   A/p 1) acute diverticulitis--abdominal pain, nausea ,melanotic stools persist,  -Patient continues to have abdominal pain rated at 7/10 improved to 5/10 after pain medication -Continue IV Unasyn-ordered, as needed morphine  Zofran -Liquid diet for now -Leukocytosis may persist due to steroid therapy  2)Candidal esophagitis and peptic ulcer disease--EGD on 09/29/2019 consistent with Candida esophagitis as well as ulcer at the gastric antrum to the lesser curvature multiple antral erosions-- -GI consult from Dr. Laural Golden appreciated continue Diflucan and Protonix -Discussed with Dr Laural Golden the gastroenterologist who advises against discharge home on 09/30/19  3)Symptomatic anemia secondary to acute blood loss from GI source--- hemoglobin is down to 8.6 from a baseline of above 11 usually--- patient received 1 unit of PRBC on 09/28/2019 -Continues to have melanotic stools and nausea but no emesis-hemoglobin is down to 7.8  4) chronic atrial fibrillation--- hold Xarelto as per GI physician on 2 10/01/2019 -PTA patient was not on any AV nodal blocking agents  5)CAD--continue aspirin and Lipitor  6) hypothyroidism--- continue levothyroxine  Disposition/Need for in-Hospital Stay- patient unable to be discharged at this time due to - -Continues to have melanotic stools and nausea but no emesis-hemoglobin is down to 7.8-----abdominal pain persist, dyspnea on exertion and dizziness persist  Status is: Inpatient  Remains inpatient appropriate because:-Continues to have melanotic stools and nausea but no emesis-hemoglobin is down to 7.8-----abdominal pain persist, dyspnea on exertion and dizziness persist -Discussed with Dr Laural Golden the gastroenterologist who advises against discharge home on 09/30/19  Disposition: The patient is from: Home              Anticipated d/c is to: Home  Anticipated d/c date is: 1 day              Patient currently is not medically stable to d/c. Barriers: Not Clinically Stable- --Continues to have melanotic stools and nausea but no emesis-hemoglobin is down to 7.8-----abdominal pain persist, dyspnea on exertion and dizziness persist -Discussed with Dr Laural Golden the gastroenterologist who  advises against discharge home on 09/30/19  Code Status : full  Procedure:- EGD Patchy whitish exudate at proximal esophagus suspicious for mild Candida esophagitis.  Brushing taken for KOH prep. Esophageal mucosa otherwise normal. 3 mm ulcer at gastric antrum to ulcer lesser curvature without stigmata of bleed.  Multiple antral erosions.  Family Communication:    (patient is alert, awake and coherent)   Consults  :  Gi  DVT Prophylaxis  :  - SCDs   Lab Results  Component Value Date   PLT 216 09/30/2019    Inpatient Medications  Scheduled Meds: . aspirin EC  81 mg Oral Q breakfast  . atorvastatin  20 mg Oral Daily  . levothyroxine  125 mcg Oral Q0600  . montelukast  10 mg Oral Daily  . nystatin  5 mL Oral QID  . pantoprazole  40 mg Oral BID AC  . potassium chloride SA  20 mEq Oral Daily   Continuous Infusions: . ampicillin-sulbactam (UNASYN) IV 3 g (09/30/19 0917)   PRN Meds:.acetaminophen, ipratropium-albuterol, morphine injection, ondansetron **OR** ondansetron (ZOFRAN) IV, polyvinyl alcohol    Anti-infectives (From admission, onward)   Start     Dose/Rate Route Frequency Ordered Stop   09/29/19 1930  fluconazole (DIFLUCAN) tablet 150 mg        150 mg Oral  Once 09/29/19 1923 09/29/19 1958   09/29/19 0200  Ampicillin-Sulbactam (UNASYN) 3 g in sodium chloride 0.9 % 100 mL IVPB     Discontinue     3 g 200 mL/hr over 30 Minutes Intravenous Every 6 hours 09/29/19 0126     09/29/19 0115  metroNIDAZOLE (FLAGYL) IVPB 500 mg  Status:  Discontinued        500 mg 100 mL/hr over 60 Minutes Intravenous Every 8 hours 09/29/19 0108 09/29/19 0126        Objective:   Vitals:   09/29/19 1300 09/29/19 1353 09/29/19 2131 09/30/19 0426  BP:  (!) 111/51 (!) 127/52 (!) 117/53  Pulse: 76 68 69 70  Resp: 18 18 18 16   Temp:  97.7 F (36.5 C) 97.7 F (36.5 C) 97.7 F (36.5 C)  TempSrc:  Oral Oral Oral  SpO2: 98%  99% 95%  Weight:      Height:        Wt Readings from Last 3  Encounters:  09/28/19 80 kg  09/26/19 80.9 kg  09/20/19 77.1 kg     Intake/Output Summary (Last 24 hours) at 09/30/2019 1052 Last data filed at 09/30/2019 0900 Gross per 24 hour  Intake 1142 ml  Output 350 ml  Net 792 ml    Physical Exam  Gen:- Awake Alert,  In no apparent distress , dyspnea on exertion HEENT:- [place].AT, No sclera icterus Neck-Supple Neck,No JVD,.  Lungs-  CTAB , fair symmetrical air movement CV- S1, S2 normal, irregular  Abd-  +ve B.Sounds, Abd Soft, lower abdominal tenderness without rebound or guarding Extremity/Skin:- No  edema, pedal pulses present  Psych-affect is appropriate, oriented x3 Neuro-generalized weakness, no new focal deficits, no tremors   Data Review:   Micro Results Recent Results (from the past 240 hour(s))  Urine Culture  Status: None   Collection Time: 09/26/19  2:23 PM   Specimen: Urine  Result Value Ref Range Status   MICRO NUMBER: [id number]  Final   SPECIMEN QUALITY: Adequate  Final   Sample Source URINE  Final   STATUS: FINAL  Final   ISOLATE 1:   Final    Growth of mixed flora was isolated, suggesting probable contamination. No further testing will be performed. If clinically indicated, recollection using a method to minimize contamination, with prompt transfer to Urine Culture Transport Tube, is  recommended.   SARS Coronavirus 2 by RT PCR (hospital order, performed in [facility] hospital lab) Nasopharyngeal Nasopharyngeal Swab     Status: None   Collection Time: 09/28/19 11:30 PM   Specimen: Nasopharyngeal Swab  Result Value Ref Range Status   SARS Coronavirus 2 NEGATIVE NEGATIVE Final    Comment: (NOTE) SARS-CoV-2 target nucleic acids are NOT DETECTED.  The SARS-CoV-2 RNA is generally detectable in upper and lower respiratory specimens during the acute phase of infection. The lowest concentration of SARS-CoV-2 viral copies this assay can detect is 250 copies / mL. A negative result does not preclude SARS-CoV-2  infection and should not be used as the sole basis for treatment or other patient management decisions.  A negative result may occur with improper specimen collection / handling, submission of specimen other than nasopharyngeal swab, presence of viral mutation(s) within the areas targeted by this assay, and inadequate number of viral copies (<250 copies / mL). A negative result must be combined with clinical observations, patient history, and epidemiological information.  Fact Sheet for Patients:   StrictlyIdeas.no  Fact Sheet for Healthcare Providers: BankingDealers.co.za  This test is not yet approved or  cleared by the [place] FDA and has been authorized for detection and/or diagnosis of SARS-CoV-2 by FDA under an Emergency Use Authorization (EUA).  This EUA will remain in effect (meaning this test can be used) for the duration of the COVID-19 declaration under Section 564(b)(1) of the Act, 21 U.S.C. section 360bbb-3(b)(1), unless the authorization is terminated or revoked sooner.  Performed at [facility], [address] [place], [place] [address]   Urine Culture     Status: Abnormal   Collection Time: 09/29/19  1:13 AM   Specimen: Urine, Clean Catch  Result Value Ref Range Status   Specimen Description   Final    URINE, CLEAN CATCH Performed at [facility], [address] [place], [place] [address]    Special Requests   Final    NONE Performed at [facility], [address] [place], [place] [address]    Culture (A)  Final    <10,000 COLONIES/mL INSIGNIFICANT GROWTH Performed at [facility], [place] [address] [place], [place] [address]    Report Status 09/30/2019 FINAL  Final  Culture, blood (Routine X 2) w Reflex to ID Panel     Status: None (Preliminary result)   Collection Time: 09/29/19  1:36 AM   Specimen: Left Antecubital; Blood  Result Value Ref Range Status   Specimen Description LEFT ANTECUBITAL   Final   Special Requests   Final    BOTTLES DRAWN AEROBIC AND ANAEROBIC Blood Culture adequate volume   Culture   Final    NO GROWTH 1 DAY Performed at [facility], [address], [place], [place] [address]    Report Status PENDING  Incomplete  Culture, blood (Routine X 2) w Reflex to ID Panel     Status: None (Preliminary result)  Collection Time: 09/29/19  6:16 AM   Specimen: BLOOD LEFT WRIST  Result Value Ref Range Status   Specimen Description BLOOD LEFT WRIST BOTTLES DRAWN AEROBIC ONLY  Final   Special Requests Blood Culture adequate volume  Final   Culture   Final    NO GROWTH 1 DAY Performed at [facility], [address] [place], [place] [address]    Report Status PENDING  Incomplete  KOH prep     Status: None   Collection Time: 09/29/19 12:33 PM   Specimen: Bronchial Brush  Result Value Ref Range Status   Specimen Description BRONCHIAL BRUSHING  Final   Special Requests NONE  Final   KOH Prep   Final    FEW YEAST Performed at [facility], [address], [place],  [address]    Report Status 09/29/2019 FINAL  Final    Radiology Reports VAS [place] ABI WITH/WO TBI  Result Date: 09/14/2019 LOWER EXTREMITY DOPPLER STUDY Indications: Recent fall Wound right anterior calf/foot. High Risk Factors: Hypertension, hyperlipidemia, past history of smoking. Other Factors: AFIB.  Vascular Interventions: EVAR with stent graft and right femoral angioplasty                         04/11/2013. Performing Technologist: Alvia Grove RVT  Examination Guidelines: A complete evaluation includes at minimum, Doppler waveform signals and systolic blood pressure reading at the level of bilateral brachial, anterior tibial, and posterior tibial arteries, when vessel segments are accessible. Bilateral testing is considered an integral part of a complete examination. Photoelectric Plethysmograph (PPG) waveforms and toe systolic pressure readings are included as required and additional duplex testing  as needed. Limited examinations for reoccurring indications may be performed as noted.  ABI Findings: +---------+------------------+-----+---------+--------+ Right    Rt Pressure (mmHg)IndexWaveform Comment  +---------+------------------+-----+---------+--------+ Brachial 157                                      +---------+------------------+-----+---------+--------+ PTA      179               1.14 triphasic         +---------+------------------+-----+---------+--------+ DP       175               1.11 triphasic         +---------+------------------+-----+---------+--------+ Great Toe126               0.80 Normal            +---------+------------------+-----+---------+--------+ +---------+------------------+-----+---------+-------+ Left     Lt Pressure (mmHg)IndexWaveform Comment +---------+------------------+-----+---------+-------+ Brachial 155                                     +---------+------------------+-----+---------+-------+ PTA      163               1.04 triphasic        +---------+------------------+-----+---------+-------+ DP       167               1.06 triphasic        +---------+------------------+-----+---------+-------+ Great Toe115               0.73 Normal           +---------+------------------+-----+---------+-------+ +-------+-----------+-----------+------------+------------+ ABI/TBIToday's ABIToday's TBIPrevious ABIPrevious TBI +-------+-----------+-----------+------------+------------+ Right  1.14       0.80       1.18        0.84         +-------+-----------+-----------+------------+------------+ Left   1.06       0.73       1.08        0.79         +-------+-----------+-----------+------------+------------+  Summary: Right: Resting right ankle-brachial index is within normal range. No evidence of significant right lower extremity arterial disease. The right toe-brachial index is normal. Left: Resting left  ankle-brachial index is within normal range. No evidence of significant left lower extremity arterial disease. The left toe-brachial index is normal.  *See table(s) above for measurements and observations.  Electronically signed by Servando Snare MD on 09/14/2019 at 11:40:50 AM.    Final    DG HIP UNILAT W OR W/O PELVIS 2-3 VIEWS LEFT  Result Date: 09/21/2019 CLINICAL DATA:  Left hip pain for 3 months EXAM: DG HIP (WITH OR WITHOUT PELVIS) 2-3V LEFT COMPARISON:  Pelvic radiographs dated 08/13/2019 FINDINGS: There is no evidence of hip fracture or dislocation. Moderate degenerative changes are seen in the left hip and the spine. Bilateral iliac stents are noted. IMPRESSION: Moderate degenerative changes in the left hip and spine. Electronically Signed   By: Zerita Boers M.D.   On: 09/21/2019 10:50   CT Angio Abd/Pel W and/or Wo Contrast  Result Date: 09/28/2019 CLINICAL DATA:  Bilateral lower abdominal pain for several days, anemia EXAM: CTA ABDOMEN AND PELVIS WITHOUT AND WITH CONTRAST TECHNIQUE: Multidetector CT imaging of the abdomen and pelvis was performed using the standard protocol during bolus administration of intravenous contrast. Multiplanar reconstructed images and MIPs were obtained and reviewed to evaluate the vascular anatomy. CONTRAST:  128mL OMNIPAQUE IOHEXOL 350 MG/ML SOLN COMPARISON:  04/22/2015 FINDINGS: VASCULAR Aorta: There is fusiform dilatation of the distal thoracic aorta measuring up to 3.7 cm in diameter. There is evidence of prior endoluminal stent graft repair of the abdominal aorta, with no evidence of recurrence or residual aneurysm. There is diffuse atherosclerosis without flow limiting stenosis. No evidence of dissection. Celiac: Patent without evidence of aneurysm, dissection, vasculitis or significant stenosis. SMA: Patent without evidence of aneurysm, dissection, vasculitis or significant stenosis. Renals: Both renal arteries are patent without evidence of aneurysm, dissection,  vasculitis, fibromuscular dysplasia or significant stenosis. IMA: The IMA is not identified, likely secondary to occlusion after endoluminal stent graft repair. Inflow: Moderate atherosclerosis of the bilateral internal and external iliac arteries. There is approximately 50% stenosis of the distal left common iliac artery just beyond the left iliac limb of the stent graft and just proximal to the left iliac bifurcation. No significant stenosis on the right. Proximal Outflow: Bilateral common femoral and visualized portions of the superficial and profunda femoral arteries are patent without evidence of aneurysm, dissection, vasculitis or significant stenosis. Veins: No obvious venous abnormality within the limitations of this arterial phase study. Review of the MIP images confirms the above findings. NON-VASCULAR Lower chest: Emphysema and bibasilar scarring. No acute pleural or parenchymal lung disease. The heart is enlarged, with marked right atrial dilatation. Hepatobiliary: No focal liver abnormality is seen. Status post cholecystectomy. No biliary dilatation. Pancreas: Unremarkable. No pancreatic ductal dilatation or surrounding inflammatory changes. Spleen: Normal in size without focal abnormality. Adrenals/Urinary Tract: Adrenal glands are unremarkable. Kidneys are normal, without renal calculi, focal lesion, or hydronephrosis. There is wall thickening along the left superior aspect of the bladder, likely due to adjacent  inflammatory changes involving the sigmoid colon. Stomach/Bowel: No bowel obstruction or ileus. There is diverticulosis of the sigmoid colon, with wall thickening and pericolonic fat stranding involving the proximal sigmoid colon consistent with acute uncomplicated diverticulitis. There is no intraluminal contrast accumulation to suggest active gastrointestinal bleeding. Lymphatic: No pathologic adenopathy within the abdomen or pelvis. Reproductive: Uterus and bilateral adnexa are  unremarkable. Other: There is fat stranding within the left lower quadrant most likely representing acute sigmoid diverticulitis. No free fluid or free gas. Musculoskeletal: No acute or destructive bony lesions. Reconstructed images demonstrate no additional findings. IMPRESSION: VASCULAR 1. No evidence of active gastrointestinal bleeding. 2. 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 3. Postsurgical changes from endoluminal stent graft repair of the abdominal aorta. No evidence of recurrence or residual aneurysm. No complication. 4. Diffuse atherosclerosis. There is focal stenosis in the distal left common iliac artery just proximal to the bifurcation approaching 50%. No other critical stenoses are identified. NON-VASCULAR 1. Findings most consistent with acute sigmoid diverticulitis. No evidence of perforation, fluid collection, or abscess. Close follow-up is recommended to ensure resolution after appropriate medical management, and exclude underlying neoplasm. 2. Inflammatory changes within the left lower quadrant mesentery related to the diverticulitis described above. There is secondary inflammatory change along the left superior aspect of the bladder. 3. Aortic Atherosclerosis (ICD10-I70.0) and Emphysema (ICD10-J43.9). Electronically Signed   By: Randa Ngo M.D.   On: 09/28/2019 22:07     CBC Recent Labs  Lab 09/26/19 1423 09/28/19 1325 09/29/19 0616 09/30/19 0559  WBC 11.9* 17.6* 15.6* 12.7*  HGB 8.6* 8.0* 8.5* 7.8*  HCT 27.5* 26.1* 26.7* 25.4*  PLT 258 254 223 216  MCV 90.2 91.6 91.1 92.0  MCH 28.2 28.1 29.0 28.3  MCHC 31.3* 30.7 31.8 30.7  RDW 16.2* 19.6* 18.6* 18.6*  LYMPHSABS 1,666 0.9  --   --   MONOABS  --  1.0  --   --   EOSABS 119 0.0  --   --   BASOSABS 24 0.0  --   --     Chemistries  Recent Labs  Lab 09/26/19 1423 09/28/19 1325 09/29/19 0616  NA 139 134* 137  K 4.2 4.6 4.3  CL 101 96* 101  CO2 31 27 25   GLUCOSE 114* 97 93   BUN 48* 42* 34*  CREATININE 1.41* 1.34* 1.15*  CALCIUM 8.9 8.0* 7.7*  MG  --   --  2.3  AST 12  --  14*  ALT 17  --  17  ALKPHOS  --   --  31*  BILITOT 0.7  --  1.4*   ------------------------------------------------------------------------------------------------------------------ No results for input(s): CHOL, HDL, LDLCALC, TRIG, CHOLHDL, LDLDIRECT in the last 72 hours.  Lab Results  Component Value Date   HGBA1C 5.2 08/27/2019   ------------------------------------------------------------------------------------------------------------------ No results for input(s): TSH, T4TOTAL, T3FREE, THYROIDAB in the last 72 hours.  Invalid input(s): FREET3 ------------------------------------------------------------------------------------------------------------------ No results for input(s): VITAMINB12, FOLATE, FERRITIN, TIBC, IRON, RETICCTPCT in the last 72 hours.  Coagulation profile Recent Labs  Lab 09/28/19 1325 09/29/19 0616  INR 1.5* 1.2    No results for input(s): DDIMER in the last 72 hours.  Cardiac Enzymes No results for input(s): CKMB, TROPONINI, MYOGLOBIN in the last 168 hours.  Invalid input(s): CK ------------------------------------------------------------------------------------------------------------------    Component Value Date/Time   BNP 713.5 (H) 08/13/2019 0654   BNP 158 (H) 05/04/2018 1122     Roxan Hockey M.D on 09/30/2019 at 10:52 AM  Go to  [email or web address] - for contact info  [facility] - Office  [phone]

## 2019-09-30 NOTE — Progress Notes (Signed)
°  Patient complains of increased abdominal pain, IV morphine sulfate not effective,  -We will discontinue IV morphine sulfate and try IV Dilaudid for better pain control Roxan Hockey, MD

## 2019-10-01 ENCOUNTER — Encounter (HOSPITAL_COMMUNITY): Payer: Self-pay | Admitting: Internal Medicine

## 2019-10-01 DIAGNOSIS — G9341 Metabolic encephalopathy: Secondary | ICD-10-CM | POA: Diagnosis present

## 2019-10-01 DIAGNOSIS — N179 Acute kidney failure, unspecified: Secondary | ICD-10-CM | POA: Diagnosis present

## 2019-10-01 LAB — H. PYLORI ANTIBODY, IGG: H Pylori IgG: 0.18 Index Value (ref 0.00–0.79)

## 2019-10-01 LAB — CBC
HCT: 27.3 % — ABNORMAL LOW (ref 36.0–46.0)
Hemoglobin: 8.6 g/dL — ABNORMAL LOW (ref 12.0–15.0)
MCH: 29.1 pg (ref 26.0–34.0)
MCHC: 31.5 g/dL (ref 30.0–36.0)
MCV: 92.2 fL (ref 80.0–100.0)
Platelets: 229 10*3/uL (ref 150–400)
RBC: 2.96 MIL/uL — ABNORMAL LOW (ref 3.87–5.11)
RDW: 18.7 % — ABNORMAL HIGH (ref 11.5–15.5)
WBC: 13.3 10*3/uL — ABNORMAL HIGH (ref 4.0–10.5)
nRBC: 0 % (ref 0.0–0.2)

## 2019-10-01 LAB — BASIC METABOLIC PANEL
Anion gap: 12 (ref 5–15)
BUN: 31 mg/dL — ABNORMAL HIGH (ref 8–23)
CO2: 25 mmol/L (ref 22–32)
Calcium: 8 mg/dL — ABNORMAL LOW (ref 8.9–10.3)
Chloride: 100 mmol/L (ref 98–111)
Creatinine, Ser: 1.6 mg/dL — ABNORMAL HIGH (ref 0.44–1.00)
GFR calc Af Amer: 36 mL/min — ABNORMAL LOW (ref >60)
GFR calc non Af Amer: 31 mL/min — ABNORMAL LOW (ref >60)
Glucose, Bld: 109 mg/dL — ABNORMAL HIGH (ref 70–99)
Potassium: 4 mmol/L (ref 3.5–5.1)
Sodium: 137 mmol/L (ref 135–145)

## 2019-10-01 MED ORDER — TRAMADOL HCL 50 MG PO TABS
50.0000 mg | ORAL_TABLET | Freq: Four times a day (QID) | ORAL | Status: DC | PRN
  Filled 2019-10-01: qty 1

## 2019-10-01 MED ORDER — DIPHENHYDRAMINE HCL 25 MG PO CAPS
25.0000 mg | ORAL_CAPSULE | Freq: Once | ORAL | Status: AC
  Administered 2019-10-01: 25 mg via ORAL
  Filled 2019-10-01: qty 1

## 2019-10-01 MED ORDER — ACETAMINOPHEN 500 MG PO TABS
1000.0000 mg | ORAL_TABLET | Freq: Four times a day (QID) | ORAL | Status: DC | PRN
  Administered 2019-10-01 – 2019-10-02 (×3): 1000 mg via ORAL
  Filled 2019-10-01 (×3): qty 2

## 2019-10-01 MED ORDER — TRAZODONE HCL 50 MG PO TABS
100.0000 mg | ORAL_TABLET | Freq: Every day | ORAL | Status: DC
  Administered 2019-10-01: 100 mg via ORAL
  Filled 2019-10-01: qty 2

## 2019-10-01 NOTE — Progress Notes (Addendum)
   10/01/19 0528  Provider Notification  Provider Name/Title  Dr. Josephine Cables  Date Provider Notified 10/01/19  Time Provider Notified [phone]  Notification Type Page  Notification Reason Other (Comment) (restless and very anxious)  Response Other (Comment) (waiting)   Benadryl 25 mg po ordered by MD

## 2019-10-01 NOTE — Care Management Important Message (Signed)
Important Message  Patient Details  Name: Katie [name] MRN: [id number] Date of Birth: 1940-08-09   Medicare Important Message Given:  Yes     Tommy Medal 10/01/2019, 1:39 PM

## 2019-10-01 NOTE — TOC Initial Note (Signed)
Transition of Care [facility]) - Initial/Assessment Note    Patient Details  Name: Katie [name] MRN: [id number] Date of Birth: 1941-02-12  Transition of Care [facility]) CM/SW Contact:    Shade Flood, LCSW Phone Number: 10/01/2019, 4:10 PM  Clinical Narrative:                  Pt admitted from home. She lives with her husband in [place]. Pt was hospitalized at [facility] in early June and discharged to [facility] but only stayed a couple days because she didn't like it. She then went home from there with [facility] Pt independent in ADLs at home.   MD anticipating dc tomorrow. It does not appear that pt will have any new TOC needs for dc.   Assigned TOC will follow.   Expected Discharge Plan: Home/Self Care Barriers to Discharge: Continued Medical Work up   Patient Goals and CMS Choice        Expected Discharge Plan and Services Expected Discharge Plan: Home/Self Care In-house Referral: Clinical Social Work   Post Acute Care Choice: Resumption of Svcs/PTA Provider Living arrangements for the past 2 months: Single Family Home Expected Discharge Date: 09/30/19                                    Prior Living Arrangements/Services Living arrangements for the past 2 months: Single Family Home Lives with:: Spouse Patient language and need for interpreter reviewed:: Yes        Need for Family Participation in Patient Care: No (Comment) Care giver support system in place?: Yes (comment)   Criminal Activity/Legal Involvement Pertinent to Current Situation/Hospitalization: No - Comment as needed  Activities of Daily Living Home Assistive Devices/Equipment: Cane (specify quad or straight) ADL Screening (condition at time of admission) Patient's cognitive ability adequate to safely complete daily activities?: Yes Is the patient deaf or have difficulty hearing?: No Does the patient have difficulty seeing, even when wearing glasses/contacts?: No Does the patient have difficulty  concentrating, remembering, or making decisions?: No Patient able to express need for assistance with ADLs?: Yes Does the patient have difficulty dressing or bathing?: No Independently performs ADLs?: Yes (appropriate for developmental age) Does the patient have difficulty walking or climbing stairs?: Yes Weakness of Legs: Both Weakness of Arms/Hands: None  Permission Sought/Granted                  Emotional Assessment       Orientation: : Oriented to Self, Oriented to Place, Oriented to  Time, Oriented to Situation Alcohol / Substance Use: Not Applicable Psych Involvement: No (comment)  Admission diagnosis:  Diverticulitis [K57.92] Acute GI bleeding [K92.2] Gastrointestinal hemorrhage, unspecified gastrointestinal hemorrhage type [K92.2] Patient Active Problem List   Diagnosis Date Noted  . Diverticulitis 09/29/2019  . Symptomatic anemia 09/29/2019  . Acute lower UTI 09/29/2019  . Open wound of right foot 09/29/2019  . Leukocytosis 09/29/2019  . Dehydration 09/29/2019  . Acute GI bleeding 09/28/2019  . Acute encephalopathy 08/12/2019  . Senile purpura ([place]) 05/30/2019  . Rheumatoid arthritis involving multiple sites ([place]) 05/30/2019  . Obesity (BMI 30.0-34.9) 05/30/2019  . Mononeuropathy 11/23/2018  . 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 COPD (chronic obstructive pulmonary disease) with chronic bronchitis ([place]) 02/02/2018  . Aortic atherosclerosis ([place]) 10/17/2017  . PAH (pulmonary artery hypertension) ([place]) 06/23/2017  . Gout 03/20/2017  . Abnormal glucose 09/19/2015  . AAA (  abdominal aortic aneurysm) without rupture ([place]) 04/22/2015  . PVD (peripheral vascular disease) with claudication ([place]) 10/16/2013  . CKD (chronic kidney disease) stage 3, GFR 30-59 ml/min 06/08/2013  . Hyperlipidemia, mixed 06/08/2013  . 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 Long term current use of anticoagulant therapy  04/23/2013  . Vitamin D deficiency 02/15/2013  . Hypothyroidism   . Osteopenia   . Chronic combined systolic and diastolic CHF (congestive heart failure) ([place]) 11/25/2008  . Essential hypertension 11/22/2008  . Atherosclerosis of native coronary artery of native heart without angina pectoris 11/22/2008  . Atrial fibrillation ([place]) 11/22/2008  . COPD (chronic obstructive pulmonary disease) ([place]) 11/22/2008  . Gastroesophageal reflux disease without esophagitis 11/22/2008   PCP:  [name], MD Pharmacy:   [id number] - [place], [place] [address] [place] [address] Phone: [phone] Fax: [phone]     Social Determinants of Health (SDOH) Interventions    Readmission Risk Interventions Readmission Risk Prevention Plan 10/01/2019  Transportation Screening Complete  Medication Review Press photographer) Complete  SW Recovery Care/Counseling Consult Complete  Palliative Care Screening Not [place] Not Applicable  Some recent data might be hidden

## 2019-10-01 NOTE — Progress Notes (Signed)
Subjective: Feels nauseated. Doesn't like the way dilaudid makes her feel "I feel numb and out of it. I dont like that stuff." She received oxycodone at 0600 and dialudid at 0830. No pain right now, before pain medications pain was about a 6. No vomiting. No bowel movement in the last day. No other GI complaints. Asking if she'll ever get better. Revewed EGD results with her.   Objective: Vital signs in last 24 hours: Temp:  [98.5 F (36.9 C)-98.7 F (37.1 C)] 98.7 F (37.1 C) (07/19 0453) Pulse Rate:  [69-70] 70 (07/19 0549) Resp:  [16] 16 (07/19 0453) BP: (119-130)/(50) 119/50 (07/19 0453) SpO2:  [86 %-99 %] 99 % (07/19 0549) Last BM Date: 09/28/19 General:   Alert and oriented, pleasant. Seems a bit "out of it."  Head:  Normocephalic and atraumatic. Eyes:  No icterus, sclera clear. Conjuctiva pink.  Heart:  S1, S2 present, no murmurs noted.  Lungs: Clear to auscultation bilaterally, without wheezing, rales, or rhonchi.  Abdomen:  Bowel sounds present, soft, non-tender, non-distended. No HSM or hernias noted. No rebound or guarding. No masses appreciated  Msk:  Symmetrical without gross deformities Extremities:  Without clubbing or edema. Neurologic:  Alert and  oriented x4;  grossly normal neurologically. Psych:  Alert and cooperative. Normal mood and affect.  Intake/Output from previous day: 07/18 0701 - 07/19 0700 In: 1040 [P.O.:1040] Out: 300 [Urine:300] Intake/Output this shift: Total I/O In: 240 [P.O.:240] Out: -   Lab Results: Recent Labs    09/29/19 0616 09/30/19 0559 10/01/19 0607  WBC 15.6* 12.7* 13.3*  HGB 8.5* 7.8* 8.6*  HCT 26.7* 25.4* 27.3*  PLT 223 216 229   BMET Recent Labs    09/28/19 1325 09/29/19 0616 10/01/19 0607  NA 134* 137 137  K 4.6 4.3 4.0  CL 96* 101 100  CO2 27 25 25   GLUCOSE 97 93 109*  BUN 42* 34* 31*  CREATININE 1.34* 1.15* 1.60*  CALCIUM 8.0* 7.7* 8.0*   LFT Recent Labs    09/29/19 0616  PROT 5.3*  ALBUMIN 2.7*   AST 14*  ALT 17  ALKPHOS 31*  BILITOT 1.4*   PT/INR Recent Labs    09/28/19 1325 09/29/19 0616  LABPROT 17.3* 15.1  INR 1.5* 1.2   Hepatitis Panel No results for input(s): HEPBSAG, HCVAB, HEPAIGM, HEPBIGM in the last 72 hours.   Studies/Results: No results found.  Assessment: GI bleed-  Patient presented with melena.  She underwent EGD 09/29/19 which showed small gastric ulcer with clean base along with scattered erosions; H. Pylori serologies pending.  No stigmata of bleed.  She could have bled from ulcer erosions in the setting of anticoagulation.  She did have some melanic stool yesterday, likely "washout" but no bowel movement today. Hemodynamically stable, hgb improved today to 8.6. Doubt that she is actively bleeding.  Anemia secondary to GI bleed-  Patient has received 1 unit of PRBCs. Hgb improved today to 8.6 from 7.8  Sigmoid diverticulitis-  Patient is on Unasyn and is slowly improving.  Diet advanced. Still with some persistnet pain (recently 6/10).  She has received 2 doses of dilaudid since midnight and oxycodone at 0600 as well. Feels dilaudid is too strong and doesn't want any more.  History of atrial fibrillation-  Anticoagulation on hold because of GI bleed.  History of CHF-  Patient appears to be in compensated state.  She is on diuretic therapy.  Mild Candida esophagitis noted on EGD yesterday-  Patient is  being treated with Mycostatin suspension.  Plan: 1. Continued antibiotics for diverticulitis 2. Planned outpatient colonoscopy in about 4 weeks 3. Follow (IP vs OP) for H. Pylori results and treat as needed 4. Will need to be on PPI bid at discharge for 3 months, reassess for once daily dosing after that 5. Consider reducing of d/c dilaudid; maintain oxycodone prn 6. Supportive measures 7. Anticipated d/c in the next 24-48 hours   Thank you for allowing [place] to participate in the care of Katie Chyle, DNP, AGNP-C Adult &  Gerontological Nurse Practitioner [facility]     LOS: 3 days    10/01/2019, 9:45 AM

## 2019-10-01 NOTE — Progress Notes (Addendum)
Patient Demographics:    Katie [name], is a 79 y.o. female, DOB - 13-Dec-1940, JTT:[id number]  Admit date - 09/28/2019   Admitting Physician Bernadette Hoit, DO  Outpatient Primary MD for the patient is [name], MD  LOS - 3   Chief Complaint  Patient presents with  . Abnormal Lab        Subjective:    Katie [name] today has no fevers, No chest pain,   - -Patient had increased abdominal pain, not controlled by morphine sulfate IV, she was switched to IV Dilaudid and p.o. oxycodone however after receiving Dilaudid IV and oxycodone orally patient became confused and disoriented -Poor oral intake with creatinine up to 1.6  Assessment  & Plan :    Principal Problem:   Acute GI bleeding Active Problems:   Acute metabolic encephalopathy   Essential hypertension   Atherosclerosis of native coronary artery of native heart without angina pectoris   Atrial fibrillation (HCC)   COPD (chronic obstructive pulmonary disease) (HCC)   Hypothyroidism   Hyperlipidemia, mixed   Diverticulitis   Symptomatic anemia   Acute lower UTI   Open wound of right foot   Leukocytosis   Dehydration   AKI (acute kidney injury) ([place])  Brief Summary:- - 79 y.o. female with medical history significant for HTN, HLD, ASHD/cAfib, chCHF with a BiVent AICD, CKD3,Prediabetes, Hypothyroidism, Gout , Rheumatoid Arthritisand Vitamin D Deficiencyadmitted on 09/28/2019 with concerns about acute blood loss anemia with hemoglobin dropping from 8.6-11.1 and also finding of diverticulitis Patchy whitish exudate at proximal esophagus suspicious for mild Candida esophagitis.  Brushing taken for KOH prep. Esophageal mucosa otherwise normal. 3 mm ulcer at gastric antrum to ulcer lesser curvature without stigmata of bleed.  Multiple antral erosions.   A/p 1) acute diverticulitis--abdominal pain, nausea ,melanotic stools persist,   -Confusional episodes with Dilaudid -Continue IV Unasyn-ordered, as needed morphine Zofran -Liquid diet for now -Leukocytosis may persist due to steroid therapy  2)Candidal esophagitis and peptic ulcer disease--EGD on 09/29/2019 consistent with Candida esophagitis as well as ulcer at the gastric antrum to the lesser curvature multiple antral erosions-- -GI consult from Dr. Laural Golden appreciated continue Diflucan and Protonix -Discussed with Dr Laural Golden the gastroenterologist who advises against discharge home on 09/30/19  3)Symptomatic anemia secondary to acute blood loss from GI source--- hemoglobin is down to 8.6 from a baseline of above 11 usually--- patient received 1 unit of PRBC on 09/28/2019  4) chronic atrial fibrillation--- hold Xarelto as per GI physician on 2 10/01/2019 -PTA patient was not on any AV nodal blocking agents  5)CAD--continue aspirin and Lipitor  6) hypothyroidism--- continue levothyroxine  7)Aki on -CKD Stage IIIb --creatinine up to 1.6, monitor closely avoid excessive diuresis - renally adjust medications, avoid nephrotoxic agents / dehydration  / hypotension  8) acute metabolic encephalopathy--- this is opiate induced, significant confusion, disorientation after Dilaudid and oxycodone, will de-escalate to tramadol as needed, monitor closely  Disposition/Need for in-Hospital Stay- patient unable to be discharged at this time due to - -worsening renal function and worsening confusion due to IV Dilaudid--creatinine trending up to 1.6 Status is: Inpatient  Remains inpatient appropriate because:worsening renal function and worsening confusion due to IV Dilaudid--creatinine trending up to 1.6 -Discussed with Dr Laural Golden the gastroenterologist  Disposition: The patient is from: Home              Anticipated d/c is to: Home              Anticipated d/c date is: 1 day              Patient currently is not medically stable to d/c. Barriers: Not Clinically Stable- --worsening  renal function and worsening confusion due to IV Dilaudid--creatinine trending up to 1.6 -Discussed with Dr Laural Golden the gastroenterologist  Code Status : full  Procedure:- EGD Patchy whitish exudate at proximal esophagus suspicious for mild Candida esophagitis.  Brushing taken for KOH prep. Esophageal mucosa otherwise normal. 3 mm ulcer at gastric antrum to ulcer lesser curvature without stigmata of bleed.  Multiple antral erosions.  Family Communication:    (patient is alert, awake and coherent)   Consults  :  Gi  DVT Prophylaxis  :  - SCDs   Lab Results  Component Value Date   PLT 229 10/01/2019    Inpatient Medications  Scheduled Meds: . aspirin EC  81 mg Oral Q breakfast  . atorvastatin  20 mg Oral Daily  . cycloSPORINE  1 drop Both Eyes BID  . levothyroxine  125 mcg Oral Q0600  . montelukast  10 mg Oral Daily  . nystatin  5 mL Oral QID  . pantoprazole  40 mg Oral BID AC  . potassium chloride SA  20 mEq Oral Daily  . traZODone  100 mg Oral QHS   Continuous Infusions: . ampicillin-sulbactam (UNASYN) IV 3 g (10/01/19 1553)   PRN Meds:.acetaminophen, ipratropium-albuterol, ondansetron **OR** ondansetron (ZOFRAN) IV, oxyCODONE, polyvinyl alcohol, traMADol    Anti-infectives (From admission, onward)   Start     Dose/Rate Route Frequency Ordered Stop   09/29/19 1930  fluconazole (DIFLUCAN) tablet 150 mg        150 mg Oral  Once 09/29/19 1923 09/29/19 1958   09/29/19 0200  Ampicillin-Sulbactam (UNASYN) 3 g in sodium chloride 0.9 % 100 mL IVPB     Discontinue     3 g 200 mL/hr over 30 Minutes Intravenous Every 6 hours 09/29/19 0126     09/29/19 0115  metroNIDAZOLE (FLAGYL) IVPB 500 mg  Status:  Discontinued        500 mg 100 mL/hr over 60 Minutes Intravenous Every 8 hours 09/29/19 0108 09/29/19 0126        Objective:   Vitals:   10/01/19 0453 10/01/19 0525 10/01/19 0549 10/01/19 1405  BP: (!) 119/50   (!) 135/46  Pulse: 69  70 70  Resp: 16   16  Temp: 98.7 F  (37.1 C)   98.2 F (36.8 C)  TempSrc: Oral   Oral  SpO2: (!) 86% 92% 99% 100%  Weight:      Height:        Wt Readings from Last 3 Encounters:  09/28/19 80 kg  09/26/19 80.9 kg  09/20/19 77.1 kg     Intake/Output Summary (Last 24 hours) at 10/01/2019 1817 Last data filed at 10/01/2019 1300 Gross per 24 hour  Intake 680 ml  Output 300 ml  Net 380 ml    Physical Exam  Gen:- Awake confused, disoriented after IV Dilaudid  HEENT:- [place].AT, No sclera icterus Neck-Supple Neck,No JVD,.  Lungs-  CTAB , fair symmetrical air movement CV- S1, S2 normal, irregular  Abd-  +ve B.Sounds, Abd Soft, lower abdominal tenderness without rebound or guarding Extremity/Skin:- No  edema, pedal pulses present  Psych-affect is confused and disoriented  neuro-generalized weakness, no new focal deficits, no tremors   Data Review:   Micro Results Recent Results (from the past 240 hour(s))  Urine Culture     Status: None   Collection Time: 09/26/19  2:23 PM   Specimen: Urine  Result Value Ref Range Status   MICRO NUMBER: [id number]  Final   SPECIMEN QUALITY: Adequate  Final   Sample Source URINE  Final   STATUS: FINAL  Final   ISOLATE 1:   Final    Growth of mixed flora was isolated, suggesting probable contamination. No further testing will be performed. If clinically indicated, recollection using a method to minimize contamination, with prompt transfer to Urine Culture Transport Tube, is  recommended.   SARS Coronavirus 2 by RT PCR (hospital order, performed in [facility] hospital lab) Nasopharyngeal Nasopharyngeal Swab     Status: None   Collection Time: 09/28/19 11:30 PM   Specimen: Nasopharyngeal Swab  Result Value Ref Range Status   SARS Coronavirus 2 NEGATIVE NEGATIVE Final    Comment: (NOTE) SARS-CoV-2 target nucleic acids are NOT DETECTED.  The SARS-CoV-2 RNA is generally detectable in upper and lower respiratory specimens during the acute phase of infection. The  lowest concentration of SARS-CoV-2 viral copies this assay can detect is 250 copies / mL. A negative result does not preclude SARS-CoV-2 infection and should not be used as the sole basis for treatment or other patient management decisions.  A negative result may occur with improper specimen collection / handling, submission of specimen other than nasopharyngeal swab, presence of viral mutation(s) within the areas targeted by this assay, and inadequate number of viral copies (<250 copies / mL). A negative result must be combined with clinical observations, patient history, and epidemiological information.  Fact Sheet for Patients:   StrictlyIdeas.no  Fact Sheet for Healthcare Providers: BankingDealers.co.za  This test is not yet approved or  cleared by the [place] FDA and has been authorized for detection and/or diagnosis of SARS-CoV-2 by FDA under an Emergency Use Authorization (EUA).  This EUA will remain in effect (meaning this test can be used) for the duration of the COVID-19 declaration under Section 564(b)(1) of the Act, 21 U.S.C. section 360bbb-3(b)(1), unless the authorization is terminated or revoked sooner.  Performed at [facility], [address] [place], [place] [address]   Urine Culture     Status: Abnormal   Collection Time: 09/29/19  1:13 AM   Specimen: Urine, Clean Catch  Result Value Ref Range Status   Specimen Description   Final    URINE, CLEAN CATCH Performed at [facility], [address] [place], [place] [address]    Special Requests   Final    NONE Performed at [facility], [address], [place], [place] [address]    Culture (A)  Final    <10,000 COLONIES/mL INSIGNIFICANT GROWTH Performed at [facility], [place] [address] [place], [place] [address]    Report Status 09/30/2019 FINAL  Final  Culture, blood (Routine X 2) w Reflex to ID Panel     Status: None (Preliminary result)    Collection Time: 09/29/19  1:36 AM   Specimen: Left Antecubital; Blood  Result Value Ref Range Status   Specimen Description LEFT ANTECUBITAL  Final   Special Requests   Final    BOTTLES DRAWN AEROBIC AND ANAEROBIC Blood Culture adequate volume   Culture   Final    NO GROWTH 2 DAYS Performed at [facility], [address] [place], [place] [address]    Report Status PENDING  Incomplete  Culture, blood (Routine X 2) w Reflex to ID Panel     Status: None (Preliminary result)   Collection Time: 09/29/19  6:16 AM   Specimen: BLOOD LEFT WRIST  Result Value Ref Range Status   Specimen Description BLOOD LEFT WRIST BOTTLES DRAWN AEROBIC ONLY  Final   Special Requests Blood Culture adequate volume  Final   Culture   Final    NO GROWTH 2 DAYS Performed at [facility], [address], [place], [place] [address]    Report Status PENDING  Incomplete  KOH prep     Status: None   Collection Time: 09/29/19 12:33 PM   Specimen: Bronchial Brush  Result Value Ref Range Status   Specimen Description BRONCHIAL BRUSHING  Final   Special Requests NONE  Final   KOH Prep   Final    FEW YEAST Performed at [facility], [address] [place], [place] [address]    Report Status 09/29/2019 FINAL  Final    Radiology Reports VAS [place] ABI WITH/WO TBI  Result Date: 09/14/2019 LOWER EXTREMITY DOPPLER STUDY Indications: Recent fall Wound right anterior calf/foot. High Risk Factors: Hypertension, hyperlipidemia, past history of smoking. Other Factors: AFIB.  Vascular Interventions: EVAR with stent graft and right femoral angioplasty                         04/11/2013. Performing Technologist: Alvia Grove RVT  Examination Guidelines: A complete evaluation includes at minimum, Doppler waveform signals and systolic blood pressure reading at the level of bilateral brachial, anterior tibial, and posterior tibial arteries, when vessel segments are accessible. Bilateral testing is considered an integral part of a complete  examination. Photoelectric Plethysmograph (PPG) waveforms and toe systolic pressure readings are included as required and additional duplex testing as needed. Limited examinations for reoccurring indications may be performed as noted.  ABI Findings: +---------+------------------+-----+---------+--------+ Right    Rt Pressure (mmHg)IndexWaveform Comment  +---------+------------------+-----+---------+--------+ Brachial 157                                      +---------+------------------+-----+---------+--------+ PTA      179               1.14 triphasic         +---------+------------------+-----+---------+--------+ DP       175               1.11 triphasic         +---------+------------------+-----+---------+--------+ Great Toe126               0.80 Normal            +---------+------------------+-----+---------+--------+ +---------+------------------+-----+---------+-------+ Left     Lt Pressure (mmHg)IndexWaveform Comment +---------+------------------+-----+---------+-------+ Brachial 155                                     +---------+------------------+-----+---------+-------+ PTA      163               1.04 triphasic        +---------+------------------+-----+---------+-------+ DP       167               1.06 triphasic        +---------+------------------+-----+---------+-------+ [place]  Toe115               0.73 Normal           +---------+------------------+-----+---------+-------+ +-------+-----------+-----------+------------+------------+ ABI/TBIToday's ABIToday's TBIPrevious ABIPrevious TBI +-------+-----------+-----------+------------+------------+ Right  1.14       0.80       1.18        0.84         +-------+-----------+-----------+------------+------------+ Left   1.06       0.73       1.08        0.79         +-------+-----------+-----------+------------+------------+  Summary: Right: Resting right ankle-brachial index is  within normal range. No evidence of significant right lower extremity arterial disease. The right toe-brachial index is normal. Left: Resting left ankle-brachial index is within normal range. No evidence of significant left lower extremity arterial disease. The left toe-brachial index is normal.  *See table(s) above for measurements and observations.  Electronically signed by Servando Snare MD on 09/14/2019 at 11:40:50 AM.    Final    DG HIP UNILAT W OR W/O PELVIS 2-3 VIEWS LEFT  Result Date: 09/21/2019 CLINICAL DATA:  Left hip pain for 3 months EXAM: DG HIP (WITH OR WITHOUT PELVIS) 2-3V LEFT COMPARISON:  Pelvic radiographs dated 08/13/2019 FINDINGS: There is no evidence of hip fracture or dislocation. Moderate degenerative changes are seen in the left hip and the spine. Bilateral iliac stents are noted. IMPRESSION: Moderate degenerative changes in the left hip and spine. Electronically Signed   By: Zerita Boers M.D.   On: 09/21/2019 10:50   CT Angio Abd/Pel W and/or Wo Contrast  Result Date: 09/28/2019 CLINICAL DATA:  Bilateral lower abdominal pain for several days, anemia EXAM: CTA ABDOMEN AND PELVIS WITHOUT AND WITH CONTRAST TECHNIQUE: Multidetector CT imaging of the abdomen and pelvis was performed using the standard protocol during bolus administration of intravenous contrast. Multiplanar reconstructed images and MIPs were obtained and reviewed to evaluate the vascular anatomy. CONTRAST:  132mL OMNIPAQUE IOHEXOL 350 MG/ML SOLN COMPARISON:  04/22/2015 FINDINGS: VASCULAR Aorta: There is fusiform dilatation of the distal thoracic aorta measuring up to 3.7 cm in diameter. There is evidence of prior endoluminal stent graft repair of the abdominal aorta, with no evidence of recurrence or residual aneurysm. There is diffuse atherosclerosis without flow limiting stenosis. No evidence of dissection. Celiac: Patent without evidence of aneurysm, dissection, vasculitis or significant stenosis. SMA: Patent without  evidence of aneurysm, dissection, vasculitis or significant stenosis. Renals: Both renal arteries are patent without evidence of aneurysm, dissection, vasculitis, fibromuscular dysplasia or significant stenosis. IMA: The IMA is not identified, likely secondary to occlusion after endoluminal stent graft repair. Inflow: Moderate atherosclerosis of the bilateral internal and external iliac arteries. There is approximately 50% stenosis of the distal left common iliac artery just beyond the left iliac limb of the stent graft and just proximal to the left iliac bifurcation. No significant stenosis on the right. Proximal Outflow: Bilateral common femoral and visualized portions of the superficial and profunda femoral arteries are patent without evidence of aneurysm, dissection, vasculitis or significant stenosis. Veins: No obvious venous abnormality within the limitations of this arterial phase study. Review of the MIP images confirms the above findings. NON-VASCULAR Lower chest: Emphysema and bibasilar scarring. No acute pleural or parenchymal lung disease. The heart is enlarged, with marked right atrial dilatation. Hepatobiliary: No focal liver abnormality is seen. Status post cholecystectomy. No biliary dilatation. Pancreas: Unremarkable. No pancreatic ductal dilatation or surrounding inflammatory changes. Spleen: Normal  in size without focal abnormality. Adrenals/Urinary Tract: Adrenal glands are unremarkable. Kidneys are normal, without renal calculi, focal lesion, or hydronephrosis. There is wall thickening along the left superior aspect of the bladder, likely due to adjacent inflammatory changes involving the sigmoid colon. Stomach/Bowel: No bowel obstruction or ileus. There is diverticulosis of the sigmoid colon, with wall thickening and pericolonic fat stranding involving the proximal sigmoid colon consistent with acute uncomplicated diverticulitis. There is no intraluminal contrast accumulation to suggest active  gastrointestinal bleeding. Lymphatic: No pathologic adenopathy within the abdomen or pelvis. Reproductive: Uterus and bilateral adnexa are unremarkable. Other: There is fat stranding within the left lower quadrant most likely representing acute sigmoid diverticulitis. No free fluid or free gas. Musculoskeletal: No acute or destructive bony lesions. Reconstructed images demonstrate no additional findings. IMPRESSION: VASCULAR 1. No evidence of active gastrointestinal bleeding. 2. 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 3. Postsurgical changes from endoluminal stent graft repair of the abdominal aorta. No evidence of recurrence or residual aneurysm. No complication. 4. Diffuse atherosclerosis. There is focal stenosis in the distal left common iliac artery just proximal to the bifurcation approaching 50%. No other critical stenoses are identified. NON-VASCULAR 1. Findings most consistent with acute sigmoid diverticulitis. No evidence of perforation, fluid collection, or abscess. Close follow-up is recommended to ensure resolution after appropriate medical management, and exclude underlying neoplasm. 2. Inflammatory changes within the left lower quadrant mesentery related to the diverticulitis described above. There is secondary inflammatory change along the left superior aspect of the bladder. 3. Aortic Atherosclerosis (ICD10-I70.0) and Emphysema (ICD10-J43.9). Electronically Signed   By: Randa Ngo M.D.   On: 09/28/2019 22:07     CBC Recent Labs  Lab 09/26/19 1423 09/28/19 1325 09/29/19 0616 09/30/19 0559 10/01/19 0607  WBC 11.9* 17.6* 15.6* 12.7* 13.3*  HGB 8.6* 8.0* 8.5* 7.8* 8.6*  HCT 27.5* 26.1* 26.7* 25.4* 27.3*  PLT 258 254 223 216 229  MCV 90.2 91.6 91.1 92.0 92.2  MCH 28.2 28.1 29.0 28.3 29.1  MCHC 31.3* 30.7 31.8 30.7 31.5  RDW 16.2* 19.6* 18.6* 18.6* 18.7*  LYMPHSABS 1,666 0.9  --   --   --   MONOABS  --  1.0  --   --   --   EOSABS 119 0.0  --    --   --   BASOSABS 24 0.0  --   --   --     Chemistries  Recent Labs  Lab 09/26/19 1423 09/28/19 1325 09/29/19 0616 10/01/19 0607  NA 139 134* 137 137  K 4.2 4.6 4.3 4.0  CL 101 96* 101 100  CO2 31 27 25 25   GLUCOSE 114* 97 93 109*  BUN 48* 42* 34* 31*  CREATININE 1.41* 1.34* 1.15* 1.60*  CALCIUM 8.9 8.0* 7.7* 8.0*  MG  --   --  2.3  --   AST 12  --  14*  --   ALT 17  --  17  --   ALKPHOS  --   --  31*  --   BILITOT 0.7  --  1.4*  --    ------------------------------------------------------------------------------------------------------------------ No results for input(s): CHOL, HDL, LDLCALC, TRIG, CHOLHDL, LDLDIRECT in the last 72 hours.  Lab Results  Component Value Date   HGBA1C 5.2 08/27/2019   ------------------------------------------------------------------------------------------------------------------ No results for input(s): TSH, T4TOTAL, T3FREE, THYROIDAB in the last 72 hours.  Invalid input(s): FREET3 ------------------------------------------------------------------------------------------------------------------ No results for input(s): VITAMINB12, FOLATE, FERRITIN, TIBC, IRON, RETICCTPCT in the last 72  hours.  Coagulation profile Recent Labs  Lab 09/28/19 1325 09/29/19 0616  INR 1.5* 1.2    No results for input(s): DDIMER in the last 72 hours.  Cardiac Enzymes No results for input(s): CKMB, TROPONINI, MYOGLOBIN in the last 168 hours.  Invalid input(s): CK ------------------------------------------------------------------------------------------------------------------    Component Value Date/Time   BNP 713.5 (H) 08/13/2019 0654   BNP 158 (H) 05/04/2018 1122     Roxan Hockey M.D on 10/01/2019 at 6:17 PM  Go to [email or web address] - for contact info  [facility] - Office  [phone]

## 2019-10-02 LAB — CBC
HCT: 26 % — ABNORMAL LOW (ref 36.0–46.0)
Hemoglobin: 8.3 g/dL — ABNORMAL LOW (ref 12.0–15.0)
MCH: 29.1 pg (ref 26.0–34.0)
MCHC: 31.9 g/dL (ref 30.0–36.0)
MCV: 91.2 fL (ref 80.0–100.0)
Platelets: 188 10*3/uL (ref 150–400)
RBC: 2.85 MIL/uL — ABNORMAL LOW (ref 3.87–5.11)
RDW: 18.6 % — ABNORMAL HIGH (ref 11.5–15.5)
WBC: 10.5 10*3/uL (ref 4.0–10.5)
nRBC: 0 % (ref 0.0–0.2)

## 2019-10-02 LAB — BASIC METABOLIC PANEL
Anion gap: 11 (ref 5–15)
BUN: 31 mg/dL — ABNORMAL HIGH (ref 8–23)
CO2: 23 mmol/L (ref 22–32)
Calcium: 7.9 mg/dL — ABNORMAL LOW (ref 8.9–10.3)
Chloride: 102 mmol/L (ref 98–111)
Creatinine, Ser: 1.68 mg/dL — ABNORMAL HIGH (ref 0.44–1.00)
GFR calc Af Amer: 34 mL/min — ABNORMAL LOW (ref >60)
GFR calc non Af Amer: 29 mL/min — ABNORMAL LOW (ref >60)
Glucose, Bld: 96 mg/dL (ref 70–99)
Potassium: 3.7 mmol/L (ref 3.5–5.1)
Sodium: 136 mmol/L (ref 135–145)

## 2019-10-02 MED ORDER — LOPERAMIDE HCL 2 MG PO CAPS
4.0000 mg | ORAL_CAPSULE | Freq: Once | ORAL | Status: AC
  Administered 2019-10-02: 4 mg via ORAL
  Filled 2019-10-02: qty 2

## 2019-10-02 MED ORDER — SODIUM CHLORIDE 0.9 % IV SOLN
3.0000 g | Freq: Two times a day (BID) | INTRAVENOUS | Status: DC
  Filled 2019-10-02 (×2): qty 8

## 2019-10-02 MED ORDER — ASPIRIN 81 MG PO TBEC: 81.0000 mg | DELAYED_RELEASE_TABLET | Freq: Every day | ORAL | 11 refills | Status: AC

## 2019-10-02 MED ORDER — TORSEMIDE 20 MG PO TABS: 40.0000 mg | ORAL_TABLET | Freq: Every day | ORAL | 2 refills | Status: AC

## 2019-10-02 MED ORDER — AMOXICILLIN-POT CLAVULANATE 875-125 MG PO TABS
1.0000 | ORAL_TABLET | Freq: Two times a day (BID) | ORAL | 0 refills | Status: DC
Start: 2019-10-02 — End: 2019-10-02

## 2019-10-02 MED ORDER — AMOXICILLIN-POT CLAVULANATE 875-125 MG PO TABS
1.0000 | ORAL_TABLET | Freq: Two times a day (BID) | ORAL | 0 refills | Status: AC
Start: 2019-10-02 — End: 2019-10-07

## 2019-10-02 NOTE — Progress Notes (Signed)
Nsg Discharge Note  Admit Date:  09/28/2019 Discharge date: 10/02/2019   Thersa Mohiuddin to be D/C'd home per MD order.  AVS completed.  Copy for chart, and copy for patient signed, and dated. Patient/caregiver able to verbalize understanding.     Discharge Assessment: Vitals:   10/02/19 0453 10/02/19 1036  BP: (!) 131/53   Pulse: 70   Resp: 16   Temp: 97.7 F (36.5 C)   SpO2: 100% 98%   Skin clean, dry and intact without evidence of skin break down, no evidence of skin tears noted. IV catheter discontinued intact. Site without signs and symptoms of complications - no redness or edema noted at insertion site, patient denies c/o pain - only slight tenderness at site.  Dressing with slight pressure applied.  D/c Instructions-Education: Discharge instructions given to patient/family with verbalized understanding. D/c education completed with patient/family including follow up instructions, medication list, d/c activities limitations if indicated, with other d/c instructions as indicated by MD - patient able to verbalize understanding, all questions fully answered. Patient instructed to return to ED, call 911, or call MD for any changes in condition.  Patient escorted via [place], and D/C home via private auto. Pt has follow-up appt with PCP scheduled for Thursday 10/04/19 for follow-up labs. Transported home by daughter via wheelchair.  Felicie Morn, RN 10/02/2019 2:20 PM

## 2019-10-02 NOTE — Progress Notes (Signed)
RN requested to round with provider regarding discharge plan. Patient reports 7 loose stools in the last 12 hours.  Dr. Denton Brick discussed risks of dehydration and would like to keep her another night to monitor kidney function. Patient states "I want to go home, I'm not staying". Dr. Denton Brick discussed with her she could discharge today but must follow-up with her PCP office on Thursday for labs. Patient verbalized understanding. Appointment scheduled and on AVS.

## 2019-10-02 NOTE — Discharge Summary (Signed)
Katie [name], is a 79 y.o. female  DOB May 16, 1940  MRN [id number].  Admission date:  09/28/2019  Admitting Physician  Bernadette Hoit, DO  Discharge Date:  10/02/2019   Primary MD  [name], MD  Recommendations for primary care physician for things to follow:   1)You are taking Xarelto/Rivaraxaban for blood thinner so Avoid ibuprofen/Advil/Aleve/Motrin/Goody Powders/Naproxen/BC powders/Meloxicam/Diclofenac/Indomethacin and other Nonsteroidal anti-inflammatory medications as these will make you more likely to bleed and can cause stomach ulcers, can also cause Kidney problems.   2)Very low-salt diet advised 3)Weigh yourself daily, call if you gain more than 3 pounds in 1 day or more than 5 pounds in 1 week as your diuretic medications may need to be adjusted 4)Limit your Fluid  intake to no more than 60 ounces (1.8 Liters) per day 5) okay to hold torsemide for now, but okay to restart torsemide on Thursday 10/04/19 --- after repeat blood work is available due to kidney concerns   Admission Diagnosis  Diverticulitis [K57.92] Acute GI bleeding [K92.2] Gastrointestinal hemorrhage, unspecified gastrointestinal hemorrhage type [K92.2]   Discharge Diagnosis  Diverticulitis [K57.92] Acute GI bleeding [K92.2] Gastrointestinal hemorrhage, unspecified gastrointestinal hemorrhage type [K92.2]    Principal Problem:   Acute GI bleeding Active Problems:   Acute metabolic encephalopathy   Essential hypertension   Atherosclerosis of native coronary artery of native heart without angina pectoris   Atrial fibrillation (HCC)   COPD (chronic obstructive pulmonary disease) (HCC)   Hypothyroidism   Hyperlipidemia, mixed   Diverticulitis   Symptomatic anemia   Acute lower UTI   Open wound of right foot   Leukocytosis   Dehydration   AKI (acute kidney injury) ([place])      Past Medical History:  Diagnosis Date   . AAA (abdominal aortic aneurysm) ([place])   . AICD (automatic cardioverter/defibrillator) present 11/10/2017  . Arthritis    "some in my knees" (11/10/2017)  . Atrial fibrillation ([place])   . CHF (congestive heart failure) ([place])   . Chronic bronchitis ([place])   . COPD (chronic obstructive pulmonary disease) ([place])   . Depression   . GERD (gastroesophageal reflux disease)   . Gout    "daily RX" (11/10/2017)  . History of hiatal hernia   . Hyperlipidemia   . Hypertension   . Hypothyroid   . Migraine headache    "hx; none since 1980s" (11/10/2017)  . Osteopenia   . Pneumonia    "~ 3 times" (11/10/2017)  . PVD (peripheral vascular disease) ([place])     Past Surgical History:  Procedure Laterality Date  . ABDOMINAL AORTIC ENDOVASCULAR STENT GRAFT N/A 04/11/2013   Procedure: ABDOMINAL AORTIC ENDOVASCULAR STENT GRAFT WITH RIGHT FEMORAL PATCH ANGIOPLASTY;  Surgeon: Mal Misty, MD;  Location: [facility];  Service: Vascular;  Laterality: N/A;  . AV NODE ABLATION N/A 09/13/2018   Procedure: AV NODE ABLATION;  Surgeon: Evans Lance, MD;  Location: [facility];  Service: Cardiovascular;  Laterality: N/A;  . BIV ICD INSERTION CRT-D  11/10/2017  . BIV ICD INSERTION  CRT-D N/A 11/10/2017   Procedure: BIV ICD INSERTION CRT-D;  Surgeon: Evans Lance, MD;  Location: [facility];  Service: Cardiovascular;  Laterality: N/A;  . BREAST SURGERY     LEFT BREAST BIOPSY  . CARDIAC CATHETERIZATION    . CATARACT EXTRACTION W/ INTRAOCULAR LENS  IMPLANT, BILATERAL Bilateral   . CHOLECYSTECTOMY OPEN  1972  . COLONOSCOPY    . ESOPHAGEAL BRUSHING  09/29/2019   Procedure: ESOPHAGEAL BRUSHING;  Surgeon: Rogene Houston, MD;  [facility];  Service: Endoscopy;;  for candida   . ESOPHAGOGASTRODUODENOSCOPY N/A 09/29/2019   Procedure: ESOPHAGOGASTRODUODENOSCOPY (EGD);  Surgeon: Rogene Houston, MD;  [facility];  Service: Endoscopy;  Laterality: N/A;  . EYE SURGERY Bilateral     "bleeding in my eyes"  . FRACTURE SURGERY    . TIBIA FRACTURE SURGERY Left 1970s  . TONSILLECTOMY    . VARICOSE VEIN SURGERY Bilateral    "laser"       HPI  from the history and physical done on the day of admission:    Chief Complaint: Abnormal Lab  HPI: Katie [name] is a 79 y.o. female with medical history significant for HTN, HLD, ASHD/cAfib, chCHF with a BiVent AICD, CKD3,Prediabetes, Hypothyroidism, Gout , Rheumatoid Arthritisand Vitamin D Deficiencywho presents to the emergency department due to abnormal labs.  Patient had an office visit to her PCP 2 days ago during which blood work was done and she was noted to have elevated WBC and hemoglobin of 8.6 (dropped from 11.1 within 1 week), so she was sent to the ED for further evaluation and management.  Patient states that she has chronic back pain and that she was recently started on steroid taper about 10 days ago, she also complained that she has been having black stools since last 3 days.  Patient states that she was recently on antibiotics (ciprofloxacin for 2 days) due to presumed UTI.  She complains of lower abdominal pain and she also complained of burning sensation and decreased urination that has been ongoing for about 2 weeks.  Patient states that she recently sustained a fall about 5-6 weeks ago during which she sustained an abrasion on the dorsum of her right foot.  Patient complained of feeling lightheaded after arrival to the ER.  Last colonoscopy was several years ago after which she was told that she did not need any further colonoscopy due to her age.  She denies taking NSAIDs and also denies fever, chills, nausea, vomiting, shortness of breath or having any sick contact.  ED Course:  In the emergency department, she was hemodynamically stable.  Work-up in the ED showed leukocytosis, normocytic anemia, mild hyponatremia, BUN/creatinine 42/1.34 (baseline creatinine 1.1-1.3), calcium 8.0, albumin 3.3.  Stool is guaiac  positive per ED physician.  CT abdomen and pelvis with and without contrast showed no evidence of active GI bleeding, but showed findings most consistent with acute sigmoid colitis with no evidence of perforation, fluid collection or abscess.  She was treated with IV fentanyl 79mcg x 1, IV Protonix was given and patient was provided with IV hydration.  Hospitalist was asked to admit patient for further evaluation and management    Hospital Course:     Dispo:-Patient having frequent watery stools today -There is concern for renal and electrolyte abnormalities and dehydration given frequency of stools -Patient refuses to stay another night in the hospital for further observation, labs and possible IV fluids -Patient insists she is going home RN Caryl Pina  Rogers at bedside-- -Patient is reluctantly discharged home with a plan for how to recheck a BMP with PCP on Thursday, 10/04/2019 --Please see documentation from [facility]   Brief Summary:- - 79 y.o.femalewith medical history significant forHTN, HLD, ASHD/cAfib, chCHF with a BiVent AICD, CKD3,Prediabetes, Hypothyroidism, Gout , Rheumatoid Arthritisand Vitamin D Deficiencyadmitted on 09/28/2019 with concerns about acute blood loss anemia with hemoglobin dropping from 8.6-11.1 and also finding of diverticulitis Patchy whitish exudate at proximal esophagus suspicious for mild Candida esophagitis. Brushing taken for KOH prep. Esophageal mucosa otherwise normal. 3 mm ulcer at gastric antrum to ulcer lesser curvature without stigmata of bleed. Multiple antral erosions.   A/p 1) acute diverticulitis--much improved from abdominal pain complaints, no fevers No further confusion after discontinuation of Dilaudid -Treated with IV Unasyn okay to discharge on p.o. Augmentin -Diarrhea as noted above  2)Candidal esophagitis and peptic ulcer disease--EGD on 09/29/2019 consistent with Candida esophagitis as well as ulcer at the gastric  antrum to the lesser curvature multiple antral erosions-- -GI consult from Dr. Laural Golden appreciated treated with Diflucan and Protonix -   3)Symptomatic anemia secondary to acute blood loss from GI source--- hemoglobin is down to 8.3 from a baseline of above 11 usually--- patient received 1 unit of PRBC on 09/28/2019 -Overall hemoglobin is staying above eight  4) chronic atrial fibrillation--- okay to restart Xarelto -PTA patient was not on any AV nodal blocking agents  5)CAD--continue aspirin and Lipitor  6) hypothyroidism--- continue levothyroxine  7)Aki on -CKD Stage IIIb --creatinine up to 1.6,   - renally adjust medications, avoid nephrotoxic agents / dehydration  / hypotension -Could worsen with diarrhea and dehydration -Patient declines further interventions  8) acute metabolic encephalopathy--- this is opiate induced, significant confusion, disorientation after Dilaudid and oxycodone, -No further confusion after discontinuation of Dilaudid  Disposition--discharge home with family  -Discussed with Dr Laural Golden the gastroenterologist  Code Status : full  Procedure:- EGD Patchy whitish exudate at proximal esophagus suspicious for mild Candida esophagitis. Brushing taken for KOH prep. Esophageal mucosa otherwise normal. 3 mm ulcer at gastric antrum to ulcer lesser curvature without stigmata of bleed. Multiple antral erosions.  Family Communication:    (patient is alert, awake and coherent)   Consults  :  Gi  Discharge Condition: stable  Follow UP   Follow-up Information    [name], MD Follow up on 10/08/2019.   Specialty: Internal Medicine Why: at 10:00 am --Patient will need repeat CBC and repeat BMP test Contact information: [address] [place] [address] [phone]               Consults obtained -   Diet and Activity recommendation:  As advised  Discharge Instructions   Discharge Instructions    Call MD for:   difficulty breathing, headache or visual disturbances   Complete by: As directed    Call MD for:  extreme fatigue   Complete by: As directed    Call MD for:  persistant dizziness or light-headedness   Complete by: As directed    Call MD for:  persistant nausea and vomiting   Complete by: As directed    Call MD for:  severe uncontrolled pain   Complete by: As directed    Call MD for:  temperature >100.4   Complete by: As directed    Diet - low sodium heart healthy   Complete by: As directed    Discharge instructions   Complete by: As directed    1)You  are taking Xarelto/Rivaraxaban for blood thinner so Avoid ibuprofen/Advil/Aleve/Motrin/Goody Powders/Naproxen/BC powders/Meloxicam/Diclofenac/Indomethacin and other Nonsteroidal anti-inflammatory medications as these will make you more likely to bleed and can cause stomach ulcers, can also cause Kidney problems.   2)Very low-salt diet advised 3)Weigh yourself daily, call if you gain more than 3 pounds in 1 day or more than 5 pounds in 1 week as your diuretic medications may need to be adjusted 4)Limit your Fluid  intake to no more than 60 ounces (1.8 Liters) per day 5) okay to hold torsemide for now, but okay to restart torsemide on Thursday 10/04/19 --- after repeat blood work is available due to kidney concerns   Discharge wound care:   Complete by: As directed    As advised   Increase activity slowly   Complete by: As directed        Discharge Medications     Allergies as of 10/02/2019      Reactions   Amiodarone Other (See Comments)   PULMONARY TOXICITY   Diovan [valsartan] Other (See Comments)   HYPOTENSION   Doxycycline Diarrhea, Other (See Comments)   VISUAL DISTURBANCE   Flexeril [cyclobenzaprine] Other (See Comments)   FATIGUE   Keflex [cephalexin] Diarrhea   Verapamil Other (See Comments)   EDEMA   Codeine Hives      Medication List    STOP taking these medications   dexamethasone 0.5 MG tablet Commonly known  as: DECADRON     TAKE these medications   acetaminophen 500 MG tablet Commonly known as: TYLENOL Take 1,000 mg by mouth every 6 (six) hours as needed for mild pain or moderate pain.   albuterol 108 (90 Base) MCG/ACT inhaler Commonly known as: ProAir HFA Use 2 Inhalations 15 minutes Apart every 4 hours to Rescue Asthma Attack What changed:   how much to take  how to take this  when to take this  additional instructions   allopurinol 300 MG tablet Commonly known as: ZYLOPRIM Take 0.5 tablets (150 mg total) by mouth daily.   amoxicillin-clavulanate 875-125 MG tablet Commonly known as: Augmentin Take 1 tablet by mouth 2 (two) times daily for 5 days.   aspirin 81 MG EC tablet Take 1 tablet (81 mg total) by mouth daily with breakfast. Swallow whole. Start taking on: October 03, 2019 What changed:   when to take this  additional instructions   Avenova 0.01 % Soln Apply 1 application topically in the morning and at bedtime. Wash eyelids twice daily with cleaner   budesonide-formoterol 80-4.5 MCG/ACT inhaler Commonly known as: Symbicort Use 2 inhalations 30 minutes apart 2 x /day (every 12 hours) What changed:   how much to take  how to take this  when to take this  additional instructions   cetirizine 10 MG tablet Commonly known as: ZYRTEC Take 10 mg by mouth daily as needed for allergies.   ipratropium 0.06 % nasal spray Commonly known as: ATROVENT Use 1 to 2 sprays each Nostril 2 to 3 x / day as needed What changed:   how much to take  how to take this  when to take this  reasons to take this  additional instructions   ipratropium-albuterol 0.5-2.5 (3) MG/3ML Soln Commonly known as: DUONEB Inhale 3 mLs into the lungs every 6 (six) hours as needed (for shortness of breath or wheezing).   L-Lysine 500 MG Tabs Take 500 mg by mouth daily as needed.   leflunomide 20 MG tablet Commonly known as: ARAVA Take 10 mg by  mouth daily.   levothyroxine 125  MCG tablet Commonly known as: SYNTHROID Take 1 tablet (125 mcg total) by mouth daily before breakfast.   montelukast 10 MG tablet Commonly known as: SINGULAIR TAKE 1 TABLET DAILY FOR ALLERGIES & ASTHMA What changed: See the new instructions.   OXYGEN Inhale 2 L into the lungs continuous.   potassium chloride SA 20 MEQ tablet Commonly known as: KLOR-CON Take 20 mEq by mouth daily.   pravastatin 20 MG tablet Commonly known as: PRAVACHOL Take 1 tablet at Bedtime for Cholesterol What changed:   how much to take  how to take this  when to take this  additional instructions   pregabalin 25 MG capsule Commonly known as: Lyrica Take 1 capsule 2 to 3 x /day as needed for Neuropathy pain What changed:   how much to take  how to take this  when to take this   rivaroxaban 20 MG Tabs tablet Commonly known as: XARELTO Take 20 mg by mouth daily with supper.   Systane 0.4-0.3 % Soln Generic drug: Polyethyl Glycol-Propyl Glycol Place 1 drop into both eyes 4 (four) times daily as needed (for dry eyes).   torsemide 20 MG tablet Commonly known as: DEMADEX Take 2 tablets (40 mg total) by mouth daily. Starting on Thursday 10/04/19 --- after repeat blood work is available Start taking on: October 04, 2019 What changed:   when to take this  additional instructions  These instructions start on October 04, 2019. If you are unsure what to do until then, ask your doctor or other care provider.            Discharge Care Instructions  (From admission, onward)         Start     Ordered   10/02/19 0000  Discharge wound care:       Comments: As advised   10/02/19 1445          Major procedures and Radiology Reports - PLEASE review detailed and final reports for all details, in brief -   VAS [place] ABI WITH/WO TBI  Result Date: 09/14/2019 LOWER EXTREMITY DOPPLER STUDY Indications: Recent fall Wound right anterior calf/foot. High Risk Factors: Hypertension, hyperlipidemia, past  history of smoking. Other Factors: AFIB.  Vascular Interventions: EVAR with stent graft and right femoral angioplasty                         04/11/2013. Performing Technologist: Alvia Grove RVT  Examination Guidelines: A complete evaluation includes at minimum, Doppler waveform signals and systolic blood pressure reading at the level of bilateral brachial, anterior tibial, and posterior tibial arteries, when vessel segments are accessible. Bilateral testing is considered an integral part of a complete examination. Photoelectric Plethysmograph (PPG) waveforms and toe systolic pressure readings are included as required and additional duplex testing as needed. Limited examinations for reoccurring indications may be performed as noted.  ABI Findings: +---------+------------------+-----+---------+--------+ Right    Rt Pressure (mmHg)IndexWaveform Comment  +---------+------------------+-----+---------+--------+ Brachial 157                                      +---------+------------------+-----+---------+--------+ PTA      179               1.14 triphasic         +---------+------------------+-----+---------+--------+ DP       175  1.11 triphasic         +---------+------------------+-----+---------+--------+ Great Toe126               0.80 Normal            +---------+------------------+-----+---------+--------+ +---------+------------------+-----+---------+-------+ Left     Lt Pressure (mmHg)IndexWaveform Comment +---------+------------------+-----+---------+-------+ Brachial 155                                     +---------+------------------+-----+---------+-------+ PTA      163               1.04 triphasic        +---------+------------------+-----+---------+-------+ DP       167               1.06 triphasic        +---------+------------------+-----+---------+-------+ Great Toe115               0.73 Normal            +---------+------------------+-----+---------+-------+ +-------+-----------+-----------+------------+------------+ ABI/TBIToday's ABIToday's TBIPrevious ABIPrevious TBI +-------+-----------+-----------+------------+------------+ Right  1.14       0.80       1.18        0.84         +-------+-----------+-----------+------------+------------+ Left   1.06       0.73       1.08        0.79         +-------+-----------+-----------+------------+------------+  Summary: Right: Resting right ankle-brachial index is within normal range. No evidence of significant right lower extremity arterial disease. The right toe-brachial index is normal. Left: Resting left ankle-brachial index is within normal range. No evidence of significant left lower extremity arterial disease. The left toe-brachial index is normal.  *See table(s) above for measurements and observations.  Electronically signed by Servando Snare MD on 09/14/2019 at 11:40:50 AM.    Final    DG HIP UNILAT W OR W/O PELVIS 2-3 VIEWS LEFT  Result Date: 09/21/2019 CLINICAL DATA:  Left hip pain for 3 months EXAM: DG HIP (WITH OR WITHOUT PELVIS) 2-3V LEFT COMPARISON:  Pelvic radiographs dated 08/13/2019 FINDINGS: There is no evidence of hip fracture or dislocation. Moderate degenerative changes are seen in the left hip and the spine. Bilateral iliac stents are noted. IMPRESSION: Moderate degenerative changes in the left hip and spine. Electronically Signed   By: Zerita Boers M.D.   On: 09/21/2019 10:50   CT Angio Abd/Pel W and/or Wo Contrast  Result Date: 09/28/2019 CLINICAL DATA:  Bilateral lower abdominal pain for several days, anemia EXAM: CTA ABDOMEN AND PELVIS WITHOUT AND WITH CONTRAST TECHNIQUE: Multidetector CT imaging of the abdomen and pelvis was performed using the standard protocol during bolus administration of intravenous contrast. Multiplanar reconstructed images and MIPs were obtained and reviewed to evaluate the vascular anatomy. CONTRAST:   166mL OMNIPAQUE IOHEXOL 350 MG/ML SOLN COMPARISON:  04/22/2015 FINDINGS: VASCULAR Aorta: There is fusiform dilatation of the distal thoracic aorta measuring up to 3.7 cm in diameter. There is evidence of prior endoluminal stent graft repair of the abdominal aorta, with no evidence of recurrence or residual aneurysm. There is diffuse atherosclerosis without flow limiting stenosis. No evidence of dissection. Celiac: Patent without evidence of aneurysm, dissection, vasculitis or significant stenosis. SMA: Patent without evidence of aneurysm, dissection, vasculitis or significant stenosis. Renals: Both renal arteries are patent without evidence of aneurysm, dissection, vasculitis, fibromuscular dysplasia or significant stenosis. IMA: The IMA  is not identified, likely secondary to occlusion after endoluminal stent graft repair. Inflow: Moderate atherosclerosis of the bilateral internal and external iliac arteries. There is approximately 50% stenosis of the distal left common iliac artery just beyond the left iliac limb of the stent graft and just proximal to the left iliac bifurcation. No significant stenosis on the right. Proximal Outflow: Bilateral common femoral and visualized portions of the superficial and profunda femoral arteries are patent without evidence of aneurysm, dissection, vasculitis or significant stenosis. Veins: No obvious venous abnormality within the limitations of this arterial phase study. Review of the MIP images confirms the above findings. NON-VASCULAR Lower chest: Emphysema and bibasilar scarring. No acute pleural or parenchymal lung disease. The heart is enlarged, with marked right atrial dilatation. Hepatobiliary: No focal liver abnormality is seen. Status post cholecystectomy. No biliary dilatation. Pancreas: Unremarkable. No pancreatic ductal dilatation or surrounding inflammatory changes. Spleen: Normal in size without focal abnormality. Adrenals/Urinary Tract: Adrenal glands are  unremarkable. Kidneys are normal, without renal calculi, focal lesion, or hydronephrosis. There is wall thickening along the left superior aspect of the bladder, likely due to adjacent inflammatory changes involving the sigmoid colon. Stomach/Bowel: No bowel obstruction or ileus. There is diverticulosis of the sigmoid colon, with wall thickening and pericolonic fat stranding involving the proximal sigmoid colon consistent with acute uncomplicated diverticulitis. There is no intraluminal contrast accumulation to suggest active gastrointestinal bleeding. Lymphatic: No pathologic adenopathy within the abdomen or pelvis. Reproductive: Uterus and bilateral adnexa are unremarkable. Other: There is fat stranding within the left lower quadrant most likely representing acute sigmoid diverticulitis. No free fluid or free gas. Musculoskeletal: No acute or destructive bony lesions. Reconstructed images demonstrate no additional findings. IMPRESSION: VASCULAR 1. No evidence of active gastrointestinal bleeding. 2. 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 3. Postsurgical changes from endoluminal stent graft repair of the abdominal aorta. No evidence of recurrence or residual aneurysm. No complication. 4. Diffuse atherosclerosis. There is focal stenosis in the distal left common iliac artery just proximal to the bifurcation approaching 50%. No other critical stenoses are identified. NON-VASCULAR 1. Findings most consistent with acute sigmoid diverticulitis. No evidence of perforation, fluid collection, or abscess. Close follow-up is recommended to ensure resolution after appropriate medical management, and exclude underlying neoplasm. 2. Inflammatory changes within the left lower quadrant mesentery related to the diverticulitis described above. There is secondary inflammatory change along the left superior aspect of the bladder. 3. Aortic Atherosclerosis (ICD10-I70.0) and Emphysema  (ICD10-J43.9). Electronically Signed   By: Randa Ngo M.D.   On: 09/28/2019 22:07    Micro Results   Recent Results (from the past 240 hour(s))  Urine Culture     Status: None   Collection Time: 09/26/19  2:23 PM   Specimen: Urine  Result Value Ref Range Status   MICRO NUMBER: [id number]  Final   SPECIMEN QUALITY: Adequate  Final   Sample Source URINE  Final   STATUS: FINAL  Final   ISOLATE 1:   Final    Growth of mixed flora was isolated, suggesting probable contamination. No further testing will be performed. If clinically indicated, recollection using a method to minimize contamination, with prompt transfer to Urine Culture Transport Tube, is  recommended.   SARS Coronavirus 2 by RT PCR (hospital order, performed in [facility] hospital lab) Nasopharyngeal Nasopharyngeal Swab     Status: None   Collection Time: 09/28/19 11:30 PM   Specimen: Nasopharyngeal Swab  Result Value Ref Range Status  SARS Coronavirus 2 NEGATIVE NEGATIVE Final    Comment: (NOTE) SARS-CoV-2 target nucleic acids are NOT DETECTED.  The SARS-CoV-2 RNA is generally detectable in upper and lower respiratory specimens during the acute phase of infection. The lowest concentration of SARS-CoV-2 viral copies this assay can detect is 250 copies / mL. A negative result does not preclude SARS-CoV-2 infection and should not be used as the sole basis for treatment or other patient management decisions.  A negative result may occur with improper specimen collection / handling, submission of specimen other than nasopharyngeal swab, presence of viral mutation(s) within the areas targeted by this assay, and inadequate number of viral copies (<250 copies / mL). A negative result must be combined with clinical observations, patient history, and epidemiological information.  Fact Sheet for Patients:   StrictlyIdeas.no  Fact Sheet for Healthcare  Providers: BankingDealers.co.za  This test is not yet approved or  cleared by the [place] FDA and has been authorized for detection and/or diagnosis of SARS-CoV-2 by FDA under an Emergency Use Authorization (EUA).  This EUA will remain in effect (meaning this test can be used) for the duration of the COVID-19 declaration under Section 564(b)(1) of the Act, 21 U.S.C. section 360bbb-3(b)(1), unless the authorization is terminated or revoked sooner.  Performed at [facility], [address], [place], [place] [address]   Urine Culture     Status: Abnormal   Collection Time: 09/29/19  1:13 AM   Specimen: Urine, Clean Catch  Result Value Ref Range Status   Specimen Description   Final    URINE, CLEAN CATCH Performed at [facility], [address], [place], [place] [address]    Special Requests   Final    NONE Performed at [facility], [address], [place], [place] [address]    Culture (A)  Final    <10,000 COLONIES/mL INSIGNIFICANT GROWTH Performed at [facility], [place] [address] [place], [place] [address]    Report Status 09/30/2019 FINAL  Final  Culture, blood (Routine X 2) w Reflex to ID Panel     Status: None (Preliminary result)   Collection Time: 09/29/19  1:36 AM   Specimen: Left Antecubital; Blood  Result Value Ref Range Status   Specimen Description LEFT ANTECUBITAL  Final   Special Requests   Final    BOTTLES DRAWN AEROBIC AND ANAEROBIC Blood Culture adequate volume   Culture   Final    NO GROWTH 3 DAYS Performed at [facility], [address] [place], [place] [address]    Report Status PENDING  Incomplete  Culture, blood (Routine X 2) w Reflex to ID Panel     Status: None (Preliminary result)   Collection Time: 09/29/19  6:16 AM   Specimen: BLOOD LEFT WRIST  Result Value Ref Range Status   Specimen Description BLOOD LEFT WRIST BOTTLES DRAWN AEROBIC ONLY  Final   Special Requests Blood Culture adequate volume  Final   Culture    Final    NO GROWTH 3 DAYS Performed at [facility], [address] [place], [place] [address]    Report Status PENDING  Incomplete  KOH prep     Status: None   Collection Time: 09/29/19 12:33 PM   Specimen: Bronchial Brush  Result Value Ref Range Status   Specimen Description BRONCHIAL BRUSHING  Final   Special Requests NONE  Final   KOH Prep   Final    FEW YEAST Performed at [facility], [address], [place], [place]  [address]    Report Status 09/29/2019 FINAL  Final       Today   Subjective    Katie [name] today has no new complaints No fever  Or chills  No nausea vomiting,  Has diarrhea as noted above          Patient has been seen and examined prior to discharge   Objective   Blood pressure (!) 131/53, pulse 70, temperature 97.7 F (36.5 C), temperature source Oral, resp. rate 16, height 5' 3.5" (1.613 m), weight 80 kg, SpO2 98 %.   Intake/Output Summary (Last 24 hours) at 10/02/2019 1446 Last data filed at 10/01/2019 2231 Gross per 24 hour  Intake 440 ml  Output --  Net 440 ml    Exam Gen:- Awake Alert, no acute distress  HEENT:- [place].AT, No sclera icterus Neck-Supple Neck,No JVD,.  Lungs-  CTAB , good air movement bilaterally  CV- S1, S2 normal, regular Abd-  +ve B.Sounds, Abd Soft, No tenderness,    Extremity/Skin:- 1 +ve edema,   good pulses Psych-affect is appropriate, oriented x3 Neuro-no new focal deficits, no tremors    Data Review   CBC w Diff:  Lab Results  Component Value Date   WBC 10.5 10/02/2019   HGB 8.3 (L) 10/02/2019   HGB 15.5 09/05/2018   HCT 26.0 (L) 10/02/2019   HCT 46.7 (H) 09/05/2018   PLT 188 10/02/2019   PLT 256 09/05/2018   LYMPHOPCT 5 09/28/2019   MONOPCT 6 09/28/2019   EOSPCT 0 09/28/2019   BASOPCT 0 09/28/2019    CMP:  Lab Results  Component Value Date   NA 136 10/02/2019   NA 141 09/05/2018   K 3.7 10/02/2019   CL 102 10/02/2019   CO2 23 10/02/2019   BUN 31 (H) 10/02/2019   BUN 23 09/05/2018    CREATININE 1.68 (H) 10/02/2019   CREATININE 1.41 (H) 09/26/2019   GLU 79 07/22/2016   PROT 5.3 (L) 09/29/2019   ALBUMIN 2.7 (L) 09/29/2019   BILITOT 1.4 (H) 09/29/2019   ALKPHOS 31 (L) 09/29/2019   AST 14 (L) 09/29/2019   ALT 17 09/29/2019  .   Total Discharge time is about 33 minutes  Roxan Hockey M.D on 10/02/2019 at 2:46 PM  Go to [email or web address] -  for contact info  [facility] - Office  [phone]

## 2019-10-02 NOTE — Progress Notes (Signed)
  Patient having frequent watery stools up to 7 episodes of loose stools in the last 12 hours  -There is concern for renal and electrolyte abnormalities and dehydration given frequency of stools -Patient refuses to stay another night in the hospital for further observation, labs and possible IV fluids  -Patient insists she is going home  RN Caryl Pina at bedside--  -Patient is reluctantly discharged home with a plan for how to recheck a BMP with PCP on Thursday, 10/04/2019  -Please see documentation from RN Lynnae January, MD

## 2019-10-02 NOTE — Progress Notes (Signed)
Subjective:  Patient states she had 2 stools during the night and one this morning.  She had another bowel movement while hours in the room.  Stool is brown and mushy.  No blood noted. She says she had lower abdominal pain last night and Tylenol helped.  She does not have pain this morning.  She ate most of her breakfast.  She states she would like to have pain medication if Tylenol does not work.  She says she has taken tramadol on multiple occasions and did not experience any hives or side effects.  Her chart indicates that she had hives.   Current Facility-Administered Medications:  .  acetaminophen (TYLENOL) tablet 1,000 mg, 1,000 mg, Oral, Q6H PRN, Denton Brick, Courage, MD, 1,000 mg at 10/02/19 0603 .  Ampicillin-Sulbactam (UNASYN) 3 g in sodium chloride 0.9 % 100 mL IVPB, 3 g, Intravenous, Q6H, Adefeso, Oladapo, DO, Last Rate: 200 mL/hr at 10/02/19 0835, 3 g at 10/02/19 0835 .  aspirin EC tablet 81 mg, 81 mg, Oral, Q breakfast, Emokpae, Courage, MD, 81 mg at 10/02/19 0836 .  atorvastatin (LIPITOR) tablet 20 mg, 20 mg, Oral, Daily, Emokpae, Courage, MD, 20 mg at 10/02/19 0836 .  cycloSPORINE (RESTASIS) 0.05 % ophthalmic emulsion 1 drop, 1 drop, Both Eyes, BID, Emokpae, Courage, MD, 1 drop at 10/02/19 0836 .  ipratropium-albuterol (DUONEB) 0.5-2.5 (3) MG/3ML nebulizer solution 3 mL, 3 mL, Inhalation, Q6H PRN, Adefeso, Oladapo, DO .  levothyroxine (SYNTHROID) tablet 125 mcg, 125 mcg, Oral, Q0600, Adefeso, Oladapo, DO, 125 mcg at 10/02/19 0603 .  montelukast (SINGULAIR) tablet 10 mg, 10 mg, Oral, Daily, Adefeso, Oladapo, DO, 10 mg at 10/02/19 0836 .  nystatin (MYCOSTATIN) 100000 UNIT/ML suspension 500,000 Units, 5 mL, Oral, QID, Aradhya Shellenbarger, Mechele Dawley, MD, 500,000 Units at 10/02/19 0836 .  ondansetron (ZOFRAN) tablet 4 mg, 4 mg, Oral, Q6H PRN **OR** ondansetron (ZOFRAN) injection 4 mg, 4 mg, Intravenous, Q6H PRN, Adefeso, Oladapo, DO, 4 mg at 10/01/19 0230 .  oxyCODONE (Oxy IR/ROXICODONE) immediate  release tablet 5 mg, 5 mg, Oral, Q4H PRN, Emokpae, Courage, MD, 5 mg at 10/01/19 0600 .  pantoprazole (PROTONIX) EC tablet 40 mg, 40 mg, Oral, BID AC, Mataya Kilduff U, MD, 40 mg at 10/02/19 0277 .  polyvinyl alcohol (LIQUIFILM TEARS) 1.4 % ophthalmic solution 1 drop, 1 drop, Both Eyes, QID PRN, Adefeso, Oladapo, DO .  potassium chloride SA (KLOR-CON) CR tablet 20 mEq, 20 mEq, Oral, Daily, Adefeso, Oladapo, DO, 20 mEq at 10/02/19 0836 .  traMADol (ULTRAM) tablet 50 mg, 50 mg, Oral, Q6H PRN, Emokpae, Courage, MD .  traZODone (DESYREL) tablet 100 mg, 100 mg, Oral, QHS, Emokpae, Courage, MD, 100 mg at 10/01/19 2216   Objective: Blood pressure (!) 131/53, pulse 70, temperature 97.7 F (36.5 C), temperature source Oral, resp. rate 16, height 5' 3.5" (1.613 m), weight 80 kg, SpO2 100 %. Patient is alert and sitting in reclining chair. She appears to be comfortable. Abdominal exam reveals soft abdomen without tenderness organomegaly or masses. No LE edema.  Labs/studies Results:   CBC Latest Ref Rng & Units 10/02/2019 10/01/2019 09/30/2019  WBC 4.0 - 10.5 K/uL 10.5 13.3(H) 12.7(H)  Hemoglobin 12.0 - 15.0 g/dL 8.3(L) 8.6(L) 7.8(L)  Hematocrit 36 - 46 % 26.0(L) 27.3(L) 25.4(L)  Platelets 150 - 400 K/uL 188 229 216    CMP Latest Ref Rng & Units 10/02/2019 10/01/2019 09/29/2019  Glucose 70 - 99 mg/dL 96 109(H) 93  BUN 8 - 23 mg/dL 31(H) 31(H) 34(H)  Creatinine 0.44 -  1.00 mg/dL 1.68(H) 1.60(H) 1.15(H)  Sodium 135 - 145 mmol/L 136 137 137  Potassium 3.5 - 5.1 mmol/L 3.7 4.0 4.3  Chloride 98 - 111 mmol/L 102 100 101  CO2 22 - 32 mmol/L 23 25 25   Calcium 8.9 - 10.3 mg/dL 7.9(L) 8.0(L) 7.7(L)  Total Protein 6.5 - 8.1 g/dL - - 5.3(L)  Total Bilirubin 0.3 - 1.2 mg/dL - - 1.4(H)  Alkaline Phos 38 - 126 U/L - - 31(L)  AST 15 - 41 U/L - - 14(L)  ALT 0 - 44 U/L - - 17    H. pylori serology is is negative.  Assessment:  #1.  GI bleed.  Patient presented with melena.  She underwent  esophagogastroduodenoscopy 3 days ago revealing erosive gastritis and small antral ulcer with clean base nevertheless felt to be source of bleeding.  Melena has cleared.  H. pylori serology is negative.  Patient is on pantoprazole 40 mg twice daily.  #2.  Anemia secondary to GI bleed.  Patient has received 1 unit of PRBCs on admission.  Her hemoglobin remains low but stable.  No evidence of overt GI bleed.  #3.  Sigmoid diverticulitis.  Patient is on Unasyn.  Abdominal examination is benign.  However she reports passing mushy stools.  #4.  History of atrial fibrillation.  Patient is back on aspirin and will go back on Xarelto/rivaroxaban the time of discharge  #5.  History of CHF.  Patient appears to be in compensated state.  She is on diuretic therapy.  #6.  Mild Candida esophagitis noted on EGD.  She is on Mycostatin which should be continued for another 1 week.  Recommendations  Patient's nursing staff asked to examine stool when she has the next bowel movement and if it is loose will send to the lab for C. difficile antigen and toxin. We will plan office visit in 4 weeks before colonoscopy scheduled.

## 2019-10-02 NOTE — Discharge Instructions (Signed)
1)You are taking Xarelto/Rivaraxaban for blood thinner so Avoid ibuprofen/Advil/Aleve/Motrin/Goody Powders/Naproxen/BC powders/Meloxicam/Diclofenac/Indomethacin and other Nonsteroidal anti-inflammatory medications as these will make you more likely to bleed and can cause stomach ulcers, can also cause Kidney problems.   2)Very low-salt diet advised 3)Weigh yourself daily, call if you gain more than 3 pounds in 1 day or more than 5 pounds in 1 week as your diuretic medications may need to be adjusted 4)Limit your Fluid  intake to no more than 60 ounces (1.8 Liters) per day 5) okay to hold torsemide for now, but okay to restart torsemide on Thursday 10/04/19 --- after repeat blood work is available due to kidney concerns

## 2019-10-03 ENCOUNTER — Other Ambulatory Visit: Payer: Self-pay | Admitting: Physician Assistant

## 2019-10-03 ENCOUNTER — Telehealth: Payer: Self-pay | Admitting: *Deleted

## 2019-10-03 DIAGNOSIS — M0609 Rheumatoid arthritis without rheumatoid factor, multiple sites: Secondary | ICD-10-CM

## 2019-10-03 NOTE — Progress Notes (Deleted)
Hospital follow up  Assessment and Plan: Hospital visit follow up for ***:   All medications were reviewed with patient and family and fully reconciled. All questions answered fully, and patient and family members were encouraged to call the office with any further questions or concerns. Discussed goal to avoid readmission related to this diagnosis.  There are no discontinued medications.  CAN NOT DO FOR BCBS REGULAR OR MEDICARE Over 40 minutes of exam, counseling, chart review, and complex, high/moderate level critical decision making was performed this visit.   Future Appointments  Date Time Provider [place]  10/04/2019  9:00 AM Deloria Lair, NP [facility] None  10/05/2019  9:00 AM Ricard Dillon, MD [facility]  10/08/2019 10:00 AM Vicie Mutters, PA-C [facility] None  11/22/2019  7:35 AM [facility] DEVICE REMOTES [facility]  11/30/2019 10:30 AM Vicie Mutters, PA-C [facility] None  12/31/2019  9:00 AM Deloria Lair, NP [facility] None  02/21/2020  7:35 AM [facility] DEVICE REMOTES [facility]  03/17/2020  9:00 AM [name], MD [facility] None  06/16/2020 11:15 AM Liane Comber, NP [facility] None     HPI 79 y.o.female presents for follow up for transition from recent hospitalization or SNIF stay. Admit date to the hospital was 09/28/19, patient was discharged from the hospital on 10/02/19 and our clinical staff contacted the office the day after discharge to set up a follow up appointment. The discharge summary, medications, and diagnostic test results were reviewed before meeting with the patient. The patient was admitted for:  Diverticulitis with bleeding   Home health is involved. PATIENT WILL BENEFIT FROM HOME HEALTH DUE TO CHF, COPD, AND RECENT HOSPITALIZATION FOR DIVERTICULITIS WITH ACUTE BLEEDING.   Images while in the hospital: CT Angio Abd/Pel W and/or Wo Contrast  Result Date: 09/28/2019 CLINICAL DATA:  Bilateral lower  abdominal pain for several days, anemia EXAM: CTA ABDOMEN AND PELVIS WITHOUT AND WITH CONTRAST TECHNIQUE: Multidetector CT imaging of the abdomen and pelvis was performed using the standard protocol during bolus administration of intravenous contrast. Multiplanar reconstructed images and MIPs were obtained and reviewed to evaluate the vascular anatomy. CONTRAST:  120mL OMNIPAQUE IOHEXOL 350 MG/ML SOLN COMPARISON:  04/22/2015 FINDINGS: VASCULAR Aorta: There is fusiform dilatation of the distal thoracic aorta measuring up to 3.7 cm in diameter. There is evidence of prior endoluminal stent graft repair of the abdominal aorta, with no evidence of recurrence or residual aneurysm. There is diffuse atherosclerosis without flow limiting stenosis. No evidence of dissection. Celiac: Patent without evidence of aneurysm, dissection, vasculitis or significant stenosis. SMA: Patent without evidence of aneurysm, dissection, vasculitis or significant stenosis. Renals: Both renal arteries are patent without evidence of aneurysm, dissection, vasculitis, fibromuscular dysplasia or significant stenosis. IMA: The IMA is not identified, likely secondary to occlusion after endoluminal stent graft repair. Inflow: Moderate atherosclerosis of the bilateral internal and external iliac arteries. There is approximately 50% stenosis of the distal left common iliac artery just beyond the left iliac limb of the stent graft and just proximal to the left iliac bifurcation. No significant stenosis on the right. Proximal Outflow: Bilateral common femoral and visualized portions of the superficial and profunda femoral arteries are patent without evidence of aneurysm, dissection, vasculitis or significant stenosis. Veins: No obvious venous abnormality within the limitations of this arterial phase study. Review of the MIP images confirms the above findings. NON-VASCULAR Lower chest: Emphysema and bibasilar scarring. No acute pleural or parenchymal lung  disease. The heart is enlarged, with marked right atrial dilatation. Hepatobiliary: No  focal liver abnormality is seen. Status post cholecystectomy. No biliary dilatation. Pancreas: Unremarkable. No pancreatic ductal dilatation or surrounding inflammatory changes. Spleen: Normal in size without focal abnormality. Adrenals/Urinary Tract: Adrenal glands are unremarkable. Kidneys are normal, without renal calculi, focal lesion, or hydronephrosis. There is wall thickening along the left superior aspect of the bladder, likely due to adjacent inflammatory changes involving the sigmoid colon. Stomach/Bowel: No bowel obstruction or ileus. There is diverticulosis of the sigmoid colon, with wall thickening and pericolonic fat stranding involving the proximal sigmoid colon consistent with acute uncomplicated diverticulitis. There is no intraluminal contrast accumulation to suggest active gastrointestinal bleeding. Lymphatic: No pathologic adenopathy within the abdomen or pelvis. Reproductive: Uterus and bilateral adnexa are unremarkable. Other: There is fat stranding within the left lower quadrant most likely representing acute sigmoid diverticulitis. No free fluid or free gas. Musculoskeletal: No acute or destructive bony lesions. Reconstructed images demonstrate no additional findings. IMPRESSION: VASCULAR 1. No evidence of active gastrointestinal bleeding. 2. 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 3. Postsurgical changes from endoluminal stent graft repair of the abdominal aorta. No evidence of recurrence or residual aneurysm. No complication. 4. Diffuse atherosclerosis. There is focal stenosis in the distal left common iliac artery just proximal to the bifurcation approaching 50%. No other critical stenoses are identified. NON-VASCULAR 1. Findings most consistent with acute sigmoid diverticulitis. No evidence of perforation, fluid collection, or abscess. Close follow-up is recommended  to ensure resolution after appropriate medical management, and exclude underlying neoplasm. 2. Inflammatory changes within the left lower quadrant mesentery related to the diverticulitis described above. There is secondary inflammatory change along the left superior aspect of the bladder. 3. Aortic Atherosclerosis (ICD10-I70.0) and Emphysema (ICD10-J43.9). Electronically Signed   By: Randa Ngo M.D.   On: 09/28/2019 22:07     Current Outpatient Medications (Endocrine & Metabolic):    levothyroxine (SYNTHROID) 125 MCG tablet, Take 1 tablet (125 mcg total) by mouth daily before breakfast.  Current Outpatient Medications (Cardiovascular):    pravastatin (PRAVACHOL) 20 MG tablet, Take 1 tablet at Bedtime for Cholesterol (Patient taking differently: Take 10 mg by mouth every other day. )   [START ON 10/04/2019] torsemide (DEMADEX) 20 MG tablet, Take 2 tablets (40 mg total) by mouth daily. Starting on Thursday 10/04/19 --- after repeat blood work is available  Current Outpatient Medications (Respiratory):    albuterol (PROAIR HFA) 108 (90 Base) MCG/ACT inhaler, Use 2 Inhalations 15 minutes Apart every 4 hours to Rescue Asthma Attack (Patient taking differently: Inhale 2 puffs into the lungs See admin instructions. Use 2 inhalations 15 minutes apart every 4 hours as needed for asthma attack)   budesonide-formoterol (SYMBICORT) 80-4.5 MCG/ACT inhaler, Use 2 inhalations 30 minutes apart 2 x /day (every 12 hours) (Patient taking differently: Inhale 2 puffs into the lungs daily. )   cetirizine (ZYRTEC) 10 MG tablet, Take 10 mg by mouth daily as needed for allergies.    ipratropium (ATROVENT) 0.06 % nasal spray, Use 1 to 2 sprays each Nostril 2 to 3 x / day as needed (Patient taking differently: Place 1-2 sprays into both nostrils 3 (three) times daily as needed for rhinitis (congestion). )   ipratropium-albuterol (DUONEB) 0.5-2.5 (3) MG/3ML SOLN, Inhale 3 mLs into the lungs every 6 (six) hours as  needed (for shortness of breath or wheezing).   montelukast (SINGULAIR) 10 MG tablet, TAKE 1 TABLET DAILY FOR ALLERGIES & ASTHMA (Patient taking differently: Take 10 mg by mouth daily. for Allergies & Asthma)  Current Outpatient Medications (Analgesics):    acetaminophen (TYLENOL) 500 MG tablet, Take 1,000 mg by mouth every 6 (six) hours as needed for mild pain or moderate pain.   allopurinol (ZYLOPRIM) 300 MG tablet, Take 0.5 tablets (150 mg total) by mouth daily.   aspirin EC 81 MG EC tablet, Take 1 tablet (81 mg total) by mouth daily with breakfast. Swallow whole.   leflunomide (ARAVA) 20 MG tablet, Take 10 mg by mouth daily.   Current Outpatient Medications (Hematological):    rivaroxaban (XARELTO) 20 MG TABS tablet, Take 20 mg by mouth daily with supper.  Current Outpatient Medications (Other):    amoxicillin-clavulanate (AUGMENTIN) 875-125 MG tablet, Take 1 tablet by mouth 2 (two) times daily for 5 days.   Eyelid Cleansers (AVENOVA) 0.01 % SOLN, Apply 1 application topically in the morning and at bedtime. Wash eyelids twice daily with cleaner   famotidine (PEPCID) 40 MG tablet, Take 1 tablet Daily to Prevent Heartburn & Indigestion   L-Lysine 500 MG TABS, Take 500 mg by mouth daily as needed.    OXYGEN, Inhale 2 L into the lungs continuous.   Polyethyl Glycol-Propyl Glycol (SYSTANE) 0.4-0.3 % SOLN, Place 1 drop into both eyes 4 (four) times daily as needed (for dry eyes).    potassium chloride SA (KLOR-CON) 20 MEQ tablet, Take 20 mEq by mouth daily.    pregabalin (LYRICA) 25 MG capsule, Take 1 capsule 2 to 3 x /day as needed for Neuropathy pain (Patient taking differently: Take 25 mg by mouth 3 (three) times daily. 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/10/2017   Arthritis    "some in my knees" (11/10/2017)    Atrial fibrillation (HCC)    CHF (congestive heart failure) (HCC)    Chronic bronchitis (HCC)    COPD (chronic obstructive pulmonary disease) (HCC)    Depression    GERD (gastroesophageal reflux disease)    Gout    "daily RX" (11/10/2017)   History of hiatal hernia    Hyperlipidemia    Hypertension    Hypothyroid    Migraine headache    "hx; none since 1980s" (11/10/2017)   Osteopenia    Pneumonia    "~ 3 times" (11/10/2017)   PVD (peripheral vascular disease) (HCC)      Allergies  Allergen Reactions   Amiodarone Other (See Comments)    PULMONARY TOXICITY   Diovan [Valsartan] Other (See Comments)    HYPOTENSION   Doxycycline Diarrhea and Other (See Comments)    VISUAL DISTURBANCE   Flexeril [Cyclobenzaprine] Other (See Comments)    FATIGUE   Keflex [Cephalexin] Diarrhea   Verapamil Other (See Comments)    EDEMA   Codeine Hives    ROS: all negative except above.   Physical Exam: There were no vitals filed for this visit. There were no vitals taken for this visit. General Appearance: Well nourished, in no apparent distress. Eyes: PERRLA, EOMs, conjunctiva no swelling or erythema Sinuses: No Frontal/maxillary tenderness ENT/Mouth: Ext aud canals clear, TMs without erythema, bulging. No erythema, swelling, or exudate on post pharynx.  Tonsils not swollen or erythematous. Hearing normal.  Neck: Supple, thyroid normal.  Respiratory: Respiratory effort normal, BS equal bilaterally without rales, rhonchi, wheezing or stridor.  Cardio: RRR with no MRGs. Brisk peripheral pulses without edema.  Abdomen: Soft, + BS.  Non tender, no guarding,  rebound, hernias, masses. Lymphatics: Non tender without lymphadenopathy.  Musculoskeletal: Full ROM, 5/5 strength, normal gait.  Skin: Warm, dry without rashes, lesions, ecchymosis.  Neuro: Cranial nerves intact. Normal muscle tone, no cerebellar symptoms. Sensation intact.  Psych: Awake and oriented X 3, normal affect,  Insight and Judgment appropriate.     Vicie Mutters, PA-C 9:05 PM [facility]

## 2019-10-03 NOTE — Telephone Encounter (Signed)
I left a message for the patient to return my call.

## 2019-10-03 NOTE — Telephone Encounter (Signed)
Called patient on 10/03/2019 , 11:07 AM in an attempt to reach the patient for a hospital follow up.   Admit date: 09/28/19 Discharge: 10/02/19   She does have any questions or concerns about medications from the hospital admission. The patient's medications were reviewed over the phone, they were counseled to bring in all current medications to the hospital follow up visit. She was advised, per discharge medication list, it is OK to take all her medications, except Dexamethasone.  I advised the patient to call if any questions or concerns arise about the hospital admission or medications    Home health was not started in the hospital.  All questions were answered and a follow up appointment was made. Patient has an appointment 10/08/2019 with Irving Shows  Prior to Admission medications   Medication Sig Start Date End Date Taking? Authorizing Provider  acetaminophen (TYLENOL) 500 MG tablet Take 1,000 mg by mouth every 6 (six) hours as needed for mild pain or moderate pain.    [provider]  albuterol (PROAIR HFA) 108 (90 Base) MCG/ACT inhaler Use 2 Inhalations 15 minutes Apart every 4 hours to Rescue Asthma Attack Patient taking differently: Inhale 2 puffs into the lungs See admin instructions. Use 2 inhalations 15 minutes apart every 4 hours as needed for asthma attack 07/29/18   [name], MD  allopurinol (ZYLOPRIM) 300 MG tablet Take 0.5 tablets (150 mg total) by mouth daily. 09/20/19   Vicie Mutters, PA-C  amoxicillin-clavulanate (AUGMENTIN) 875-125 MG tablet Take 1 tablet by mouth 2 (two) times daily for 5 days. 10/02/19 10/07/19  Roxan Hockey, MD  aspirin EC 81 MG EC tablet Take 1 tablet (81 mg total) by mouth daily with breakfast. Swallow whole. 10/03/19   Roxan Hockey, MD  budesonide-formoterol (SYMBICORT) 80-4.5 MCG/ACT inhaler Use 2 inhalations 30 minutes apart 2 x /day (every 12 hours) Patient taking differently: Inhale 2 puffs into the lungs daily.  08/28/19    [name], MD  cetirizine (ZYRTEC) 10 MG tablet Take 10 mg by mouth daily as needed for allergies.     [provider]  Eyelid Cleansers (AVENOVA) 0.01 % SOLN Apply 1 application topically in the morning and at bedtime. Wash eyelids twice daily with cleaner 11/28/17   [provider]  ipratropium (ATROVENT) 0.06 % nasal spray Use 1 to 2 sprays each Nostril 2 to 3 x / day as needed Patient taking differently: Place 1-2 sprays into both nostrils 3 (three) times daily as needed for rhinitis (congestion).  02/27/19 02/28/20  [name], MD  ipratropium-albuterol (DUONEB) 0.5-2.5 (3) MG/3ML SOLN Inhale 3 mLs into the lungs every 6 (six) hours as needed (for shortness of breath or wheezing). 02/27/19   [name], MD  L-Lysine 500 MG TABS Take 500 mg by mouth daily as needed.     [provider]  leflunomide (ARAVA) 20 MG tablet Take 10 mg by mouth daily.  06/18/19   [provider]  levothyroxine (SYNTHROID) 125 MCG tablet Take 1 tablet (125 mcg total) by mouth daily before breakfast. 08/18/19   Shelly Coss, MD  montelukast (SINGULAIR) 10 MG tablet TAKE 1 TABLET DAILY FOR ALLERGIES & ASTHMA Patient taking differently: Take 10 mg by mouth daily. for Allergies & Asthma 02/27/19   [name], MD  OXYGEN Inhale 2 L into the lungs continuous.    [provider]  Polyethyl Glycol-Propyl Glycol (SYSTANE) 0.4-0.3 % SOLN Place 1 drop into both eyes 4 (four) times daily as needed (for dry eyes).  [provider]  potassium chloride SA (KLOR-CON) 20 MEQ tablet Take 20 mEq by mouth daily.     [provider]  pravastatin (PRAVACHOL) 20 MG tablet Take 1 tablet at Bedtime for Cholesterol Patient taking differently: Take 10 mg by mouth every other day.  09/21/19   [name], MD  pregabalin (LYRICA) 25 MG capsule Take 1 capsule 2 to 3 x /day as needed for Neuropathy pain Patient taking differently: Take 25 mg by mouth 3  (three) times daily. Take 1 capsule 2 to 3 x /day as needed for Neuropathy pain 08/30/19   [name], MD  rivaroxaban (XARELTO) 20 MG TABS tablet Take 20 mg by mouth daily with supper.    [provider]  torsemide (DEMADEX) 20 MG tablet Take 2 tablets (40 mg total) by mouth daily. Starting on Thursday 10/04/19 --- after repeat blood work is available 10/04/19   Roxan Hockey, MD

## 2019-10-04 ENCOUNTER — Other Ambulatory Visit: Payer: Self-pay | Admitting: Internal Medicine

## 2019-10-04 ENCOUNTER — Other Ambulatory Visit: Payer: Self-pay | Admitting: *Deleted

## 2019-10-04 ENCOUNTER — Telehealth: Payer: Self-pay | Admitting: *Deleted

## 2019-10-04 LAB — CULTURE, BLOOD (ROUTINE X 2)
Culture: NO GROWTH
Culture: NO GROWTH
Special Requests: ADEQUATE
Special Requests: ADEQUATE

## 2019-10-04 MED ORDER — CIPROFLOXACIN HCL 500 MG PO TABS: ORAL_TABLET | ORAL | 0 refills | Status: DC

## 2019-10-04 MED ORDER — METRONIDAZOLE 500 MG PO TABS
ORAL_TABLET | ORAL | 0 refills | Status: AC
Start: 2019-10-04 — End: ?

## 2019-10-04 NOTE — Patient Outreach (Signed)
[facility]) Care Management  10/04/2019  Francesca Strome Nov 04, 1940 [id number]  Telephone assessment post hospitalization GI Bleed.  Dr. Idell Pickles nurse did transition of care call.  Today, [name] feels poorly. She is weak, having some diarrhea, poor appetite, has edema. She denies seeing any blood in her stools but she does have a hemorrhoid that bleeds occasionally. She is not SOB. She has not been able to weigh she is so weak. She admits to having candida in her mouth and has tablet for this.  She stopped the prescribed antibiotic (augmentin) because of diarrhea.  NOTES FOR DR. Idell Pickles ATTN:  She has NOT restarted her Xarelto.   Note discharge order does say she can resume Xarelto. Sending note to Dr. Melford Aase for verification.  Also she may need other antibiotic since she cannot tolerate the augmentin.  May need additional dosage of her diuretic, torsemide.  Encouraged pt to call NP or MD for early intervention of any medical issue!  NP to call pt in one week for follow up.  [name]. [name], MSN, [facility] Nurse Practitioner [facility] Care Management [phone]

## 2019-10-04 NOTE — Progress Notes (Signed)
°  Patient had Dx of GI bleeding From Diverticulitis & was Rx'd Augmentin in hospital  and developed Diarrhea and stopped the Augmentin  Will send in Rx's of Cipro & Flagyl

## 2019-10-04 NOTE — Telephone Encounter (Signed)
Patient reported severe diarrhea on Augmentin. Per Dr Melford Aase, patient was advised to stop Augmentin and Cipro and Flagyl RX's sent to her pharmacy. Patient is aware.

## 2019-10-05 ENCOUNTER — Encounter (HOSPITAL_BASED_OUTPATIENT_CLINIC_OR_DEPARTMENT_OTHER): Payer: Medicare Other | Admitting: Internal Medicine

## 2019-10-05 ENCOUNTER — Other Ambulatory Visit: Payer: Self-pay

## 2019-10-05 ENCOUNTER — Other Ambulatory Visit: Payer: Medicare Other

## 2019-10-05 ENCOUNTER — Other Ambulatory Visit: Payer: Self-pay | Admitting: Internal Medicine

## 2019-10-05 ENCOUNTER — Telehealth: Payer: Self-pay | Admitting: *Deleted

## 2019-10-05 DIAGNOSIS — I4891 Unspecified atrial fibrillation: Secondary | ICD-10-CM | POA: Insufficient documentation

## 2019-10-05 DIAGNOSIS — Z515 Encounter for palliative care: Secondary | ICD-10-CM | POA: Diagnosis not present

## 2019-10-05 DIAGNOSIS — Z79899 Other long term (current) drug therapy: Secondary | ICD-10-CM

## 2019-10-05 DIAGNOSIS — I10 Essential (primary) hypertension: Secondary | ICD-10-CM

## 2019-10-05 DIAGNOSIS — D689 Coagulation defect, unspecified: Secondary | ICD-10-CM | POA: Diagnosis not present

## 2019-10-05 DIAGNOSIS — N1832 Chronic kidney disease, stage 3b: Secondary | ICD-10-CM | POA: Insufficient documentation

## 2019-10-05 DIAGNOSIS — R918 Other nonspecific abnormal finding of lung field: Secondary | ICD-10-CM | POA: Diagnosis not present

## 2019-10-05 DIAGNOSIS — L97528 Non-pressure chronic ulcer of other part of left foot with other specified severity: Secondary | ICD-10-CM | POA: Insufficient documentation

## 2019-10-05 DIAGNOSIS — E871 Hypo-osmolality and hyponatremia: Secondary | ICD-10-CM | POA: Diagnosis not present

## 2019-10-05 DIAGNOSIS — E872 Acidosis: Secondary | ICD-10-CM | POA: Diagnosis not present

## 2019-10-05 DIAGNOSIS — G9341 Metabolic encephalopathy: Secondary | ICD-10-CM | POA: Diagnosis not present

## 2019-10-05 DIAGNOSIS — J9621 Acute and chronic respiratory failure with hypoxia: Secondary | ICD-10-CM | POA: Diagnosis not present

## 2019-10-05 DIAGNOSIS — Z7901 Long term (current) use of anticoagulants: Secondary | ICD-10-CM | POA: Insufficient documentation

## 2019-10-05 DIAGNOSIS — J449 Chronic obstructive pulmonary disease, unspecified: Secondary | ICD-10-CM | POA: Insufficient documentation

## 2019-10-05 DIAGNOSIS — L97518 Non-pressure chronic ulcer of other part of right foot with other specified severity: Secondary | ICD-10-CM | POA: Insufficient documentation

## 2019-10-05 DIAGNOSIS — R6521 Severe sepsis with septic shock: Secondary | ICD-10-CM | POA: Diagnosis not present

## 2019-10-05 DIAGNOSIS — J9622 Acute and chronic respiratory failure with hypercapnia: Secondary | ICD-10-CM | POA: Diagnosis not present

## 2019-10-05 DIAGNOSIS — K5720 Diverticulitis of large intestine with perforation and abscess without bleeding: Secondary | ICD-10-CM | POA: Diagnosis not present

## 2019-10-05 DIAGNOSIS — A419 Sepsis, unspecified organism: Secondary | ICD-10-CM | POA: Diagnosis not present

## 2019-10-05 DIAGNOSIS — K651 Peritoneal abscess: Secondary | ICD-10-CM | POA: Diagnosis not present

## 2019-10-05 DIAGNOSIS — I5042 Chronic combined systolic (congestive) and diastolic (congestive) heart failure: Secondary | ICD-10-CM | POA: Insufficient documentation

## 2019-10-05 DIAGNOSIS — I714 Abdominal aortic aneurysm, without rupture: Secondary | ICD-10-CM | POA: Insufficient documentation

## 2019-10-05 DIAGNOSIS — E873 Alkalosis: Secondary | ICD-10-CM | POA: Diagnosis not present

## 2019-10-05 DIAGNOSIS — K650 Generalized (acute) peritonitis: Secondary | ICD-10-CM | POA: Diagnosis not present

## 2019-10-05 DIAGNOSIS — Z66 Do not resuscitate: Secondary | ICD-10-CM | POA: Diagnosis not present

## 2019-10-05 DIAGNOSIS — N179 Acute kidney failure, unspecified: Secondary | ICD-10-CM | POA: Diagnosis not present

## 2019-10-05 DIAGNOSIS — I130 Hypertensive heart and chronic kidney disease with heart failure and stage 1 through stage 4 chronic kidney disease, or unspecified chronic kidney disease: Secondary | ICD-10-CM | POA: Insufficient documentation

## 2019-10-05 DIAGNOSIS — Z20822 Contact with and (suspected) exposure to covid-19: Secondary | ICD-10-CM | POA: Diagnosis not present

## 2019-10-05 DIAGNOSIS — I482 Chronic atrial fibrillation, unspecified: Secondary | ICD-10-CM | POA: Diagnosis not present

## 2019-10-05 DIAGNOSIS — Z87891 Personal history of nicotine dependence: Secondary | ICD-10-CM | POA: Insufficient documentation

## 2019-10-05 DIAGNOSIS — N170 Acute kidney failure with tubular necrosis: Secondary | ICD-10-CM | POA: Diagnosis not present

## 2019-10-05 DIAGNOSIS — K219 Gastro-esophageal reflux disease without esophagitis: Secondary | ICD-10-CM | POA: Diagnosis not present

## 2019-10-05 NOTE — Progress Notes (Signed)
Katie [name], Katie [name] ([id number]) Visit Report for 10/05/2019 Abuse/Suicide Risk Screen Details Patient Name: Date of Service: Katie [name] 10/05/2019 9:00 A M Medical Record Number: [id number] Patient Account Number: [id number] Date of Birth/Sex: Treating RN: 1940/06/03 (79 y.o. Female) Carlene Coria Primary Care Gevorg Brum: Kirtland Bouchard Other Clinician: Referring Rasheed Welty: Treating Emelia Sandoval/Extender: Glean Hess in Treatment: 0 Abuse/Suicide Risk Screen Items Answer ABUSE RISK SCREEN: Has anyone close to you tried to hurt or harm you recentlyo No Do you feel uncomfortable with anyone in your familyo No Has anyone forced you do things that you didnt want to doo No Electronic Signature(s) Signed: 10/05/2019 5:42:58 PM By: Carlene Coria RN Entered By: Carlene Coria on 10/05/2019 09:39:29 -------------------------------------------------------------------------------- Activities of Daily Living Details Patient Name: Date of Service: Katie [name] 10/05/2019 9:00 [place] Record Number: [id number] Patient Account Number: [id number] Date of Birth/Sex: Treating RN: 07/14/1940 (79 y.o. Female) Carlene Coria Primary Care Kinnley Paulson: Kirtland Bouchard Other Clinician: Referring Nalia Honeycutt: Treating Santez Woodcox/Extender: Glean Hess in Treatment: 0 Activities of Daily Living Items Answer Activities of Daily Living (Please select one for each item) Drive Automobile Completely Able T Medications ake Completely Able Use T elephone Completely Able Care for Appearance Completely Able Use T oilet Completely Able Bath / Shower Completely Able Dress Self Completely Able Feed Self Completely Able Walk Completely Able Get In / Out Bed Completely Able Housework Completely Able Prepare Meals Completely Able Handle Money Completely Able Shop for Self Completely Able Electronic Signature(s) Signed: 10/05/2019 5:42:58 PM By: Carlene Coria  RN Entered By: Carlene Coria on 10/05/2019 09:40:10 -------------------------------------------------------------------------------- Education Screening Details Patient Name: Date of Service: Katie [name], Katie [name] 10/05/2019 9:00 A M Medical Record Number: [id number] Patient Account Number: [id number] Date of Birth/Sex: Treating RN: 05-Sep-1940 (79 y.o. Female) Carlene Coria Primary Care Michoel Kunin: Kirtland Bouchard Other Clinician: Referring Honesti Seaberg: Treating Louetta Hollingshead/Extender: Glean Hess in Treatment: 0 Primary Learner Assessed: Patient Learning Preferences/Education Level/Primary Language Learning Preference: Explanation Highest Education Level: High School Preferred Language: English Cognitive Barrier Language Barrier: No Translator Needed: No Memory Deficit: No Emotional Barrier: No Cultural/Religious Beliefs Affecting Medical Care: No Physical Barrier Impaired Vision: No Impaired Hearing: No Decreased Hand dexterity: No Knowledge/Comprehension Knowledge Level: Medium Comprehension Level: High Ability to understand written instructions: High Ability to understand verbal instructions: High Motivation Anxiety Level: Anxious Cooperation: Cooperative Education Importance: Acknowledges Need Interest in Health Problems: Asks Questions Perception: Coherent Willingness to Engage in Self-Management High Activities: Readiness to Engage in Self-Management High Activities: Electronic Signature(s) Signed: 10/05/2019 5:42:58 PM By: Carlene Coria RN Entered By: Carlene Coria on 10/05/2019 09:40:46 -------------------------------------------------------------------------------- Fall Risk Assessment Details Patient Name: Date of Service: Katie [name], Katie [name] 10/05/2019 9:00 A M Medical Record Number: [id number] Patient Account Number: [id number] Date of Birth/Sex: Treating RN: Aug 01, 1940 (79 y.o. Female) Carlene Coria Primary Care Keatyn Jawad: Kirtland Bouchard Other Clinician: Referring Kelina Beauchamp: Treating Joelynn Dust/Extender: Glean Hess in Treatment: 0 Fall Risk Assessment Items Have you had 2 or more falls in the last 12 monthso 0 Yes Have you had any fall that resulted in injury in the last 12 monthso 0 Yes FALLS RISK SCREEN History of falling - immediate or within 3 months 25 Yes Secondary diagnosis (Do you have 2 or more medical diagnoseso) 0 No Ambulatory aid None/bed rest/wheelchair/nurse 0 No Crutches/cane/walker 0 No Furniture 0 No Intravenous therapy Access/Saline/Heparin Lock 0 No Gait/Transferring Normal/ bed rest/ wheelchair 0  No Weak (short steps with or without shuffle, stooped but able to lift head while walking, may seek 0 No support from furniture) Impaired (short steps with shuffle, may have difficulty arising from chair, head down, impaired 0 No balance) Mental Status Oriented to own ability 0 No Electronic Signature(s) Signed: 10/05/2019 5:42:58 PM By: Carlene Coria RN Entered By: Carlene Coria on 10/05/2019 09:44:46 -------------------------------------------------------------------------------- Foot Assessment Details Patient Name: Date of Service: Katie [name], Katie [name] 10/05/2019 9:00 [place] Record Number: [id number] Patient Account Number: [id number] Date of Birth/Sex: Treating RN: 11/06/1940 (79 y.o. Female) Carlene Coria Primary Care Roxy Mastandrea: Kirtland Bouchard Other Clinician: Referring Zayleigh Stroh: Treating Jaynia Fendley/Extender: Glean Hess in Treatment: 0 Foot Assessment Items Site Locations + = Sensation present, - = Sensation absent, C = Callus, U = Ulcer R = Redness, W = Warmth, M = Maceration, PU = Pre-ulcerative lesion F = Fissure, S = Swelling, D = Dryness Assessment Right: Left: Other Deformity: No No Prior Foot Ulcer: No No Prior Amputation: No No Charcot Joint: No No Ambulatory Status: Ambulatory Without Help Gait: Steady Electronic  Signature(s) Signed: 10/05/2019 5:42:58 PM By: Carlene Coria RN Entered By: Carlene Coria on 10/05/2019 09:48:26 -------------------------------------------------------------------------------- Nutrition Risk Screening Details Patient Name: Date of Service: Katie [name] 10/05/2019 9:00 [place] Record Number: [id number] Patient Account Number: [id number] Date of Birth/Sex: Treating RN: 19-Jul-1940 (79 y.o. Female) Carlene Coria Primary Care Tyrell Brereton: Kirtland Bouchard Other Clinician: Referring Marielena Harvell: Treating Trey Bebee/Extender: Glean Hess in Treatment: 0 Height (in): 63 Weight (lbs): 178 Body Mass Index (BMI): 31.5 Nutrition Risk Screening Items Score Screening NUTRITION RISK SCREEN: I have an illness or condition that made me change the kind and/or amount of food I eat 0 No I eat fewer than two meals per day 3 Yes I eat few fruits and vegetables, or milk products 0 No I have three or more drinks of beer, liquor or wine almost every day 0 No I have tooth or mouth problems that make it hard for me to eat 0 No I don't always have enough money to buy the food I need 0 No I eat alone most of the time 0 No I take three or more different prescribed or over-the-counter drugs a day 1 Yes Without wanting to, I have lost or gained 10 pounds in the last six months 0 No I am not always physically able to shop, cook and/or feed myself 0 No Nutrition Protocols Good Risk Protocol Moderate Risk Protocol 0 Provide education on nutrition High Risk Proctocol Risk Level: Moderate Risk Score: 4 Electronic Signature(s) Signed: 10/05/2019 5:42:58 PM By: Carlene Coria RN Entered By: Carlene Coria on 10/05/2019 09:46:33

## 2019-10-05 NOTE — Progress Notes (Addendum)
Katie, [name] ([id number]) Visit Report for 10/05/2019 Allergy List Details Patient Name: Date of Service: Katie [name] 10/05/2019 9:00 A M Medical Record Number: [id number] Patient Account Number: [id number] Date of Birth/Sex: Treating RN: 04/14/1940 (79 y.o. Female) Carlene Coria Primary Care Redith Drach: Kirtland Bouchard Other Clinician: Referring Brody Bonneau: Treating Yemariam Magar/Extender: Glean Hess in Treatment: 0 Allergies Active Allergies amiodarone Diovan doxycycline Flexeril Keflex verapamil codeine Allergy Notes Electronic Signature(s) Signed: 10/05/2019 5:42:58 PM By: Carlene Coria RN Entered By: Carlene Coria on 10/05/2019 09:33:35 -------------------------------------------------------------------------------- Arrival Information Details Patient Name: Date of Service: Katie [name] 10/05/2019 9:00 A M Medical Record Number: [id number] Patient Account Number: [id number] Date of Birth/Sex: Treating RN: Mar 21, 1940 (79 y.o. Female) Katie [name] Primary Care Doria Fern: Kirtland Bouchard Other Clinician: Referring Merrillyn Ackerley: Treating Johnnetta Holstine/Extender: Glean Hess in Treatment: 0 Visit Information Patient Arrived: Ambulatory Arrival Time: 09:25 Accompanied By: daughter Transfer Assistance: None Patient Identification Verified: Yes Secondary Verification Process Completed: Yes Electronic Signature(s) Signed: 10/05/2019 5:42:58 PM By: Carlene Coria RN Entered By: Carlene Coria on 10/05/2019 09:31:10 -------------------------------------------------------------------------------- Clinic Level of Care Assessment Details Patient Name: Date of Service: Katie [name] 10/05/2019 9:00 A M Medical Record Number: [id number] Patient Account Number: [id number] Date of Birth/Sex: Treating RN: 1940/05/01 (79 y.o. Female) Katie [name] Primary Care Lilana Blasko: Kirtland Bouchard Other Clinician: Referring  Eldin Bonsell: Treating Otis Portal/Extender: Glean Hess in Treatment: 0 Clinic Level of Care Assessment Items TOOL 1 Quantity Score X- 1 0 Use when EandM and Procedure is performed on INITIAL visit ASSESSMENTS - Nursing Assessment / Reassessment X- 1 20 General Physical Exam (combine w/ comprehensive assessment (listed just below) when performed on new pt. evals) X- 1 25 Comprehensive Assessment (HX, ROS, Risk Assessments, Wounds Hx, etc.) ASSESSMENTS - Wound and Skin Assessment / Reassessment []  - 0 Dermatologic / Skin Assessment (not related to wound area) ASSESSMENTS - Ostomy and/or Continence Assessment and Care []  - 0 Incontinence Assessment and Management []  - 0 Ostomy Care Assessment and Management (repouching, etc.) PROCESS - Coordination of Care X - Simple Patient / Family Education for ongoing care 1 15 []  - 0 Complex (extensive) Patient / Family Education for ongoing care X- 1 10 Staff obtains Programmer, systems, Records, T Results / Process Orders est X- 1 10 Staff telephones HHA, Nursing Homes / Clarify orders / etc []  - 0 Routine Transfer to another Facility (non-emergent condition) []  - 0 Routine Hospital Admission (non-emergent condition) X- 1 15 New Admissions / Biomedical engineer / Ordering NPWT Apligraf, etc. , []  - 0 Emergency Hospital Admission (emergent condition) PROCESS - Special Needs []  - 0 Pediatric / Minor Patient Management []  - 0 Isolation Patient Management []  - 0 Hearing / Language / Visual special needs []  - 0 Assessment of Community assistance (transportation, D/C planning, etc.) []  - 0 Additional assistance / Altered mentation []  - 0 Support Surface(s) Assessment (bed, cushion, seat, etc.) INTERVENTIONS - Miscellaneous []  - 0 External ear exam []  - 0 Patient Transfer (multiple staff / Civil Service fast streamer / Similar devices) []  - 0 Simple Staple / Suture removal (25 or less) []  - 0 Complex Staple / Suture removal  (26 or more) []  - 0 Hypo/Hyperglycemic Management (do not check if billed separately) []  - 0 Ankle / Brachial Index (ABI) - do not check if billed separately Has the patient been seen at the hospital within the last three years: Yes Total Score: 95 Level Of Care: New/Established -  Level 3 Electronic Signature(s) Signed: 10/05/2019 5:51:32 PM By: Katie [name] Entered By: Katie [name] on 10/05/2019 10:26:38 -------------------------------------------------------------------------------- Encounter Discharge Information Details Patient Name: Date of Service: Katie [name] 10/05/2019 9:00 A M Medical Record Number: [id number] Patient Account Number: [id number] Date of Birth/Sex: Treating RN: 24-Aug-1940 (79 y.o. Female) Katie [name] Primary Care Caia Lofaro: Kirtland Bouchard Other Clinician: Referring Doratha Mcswain: Treating Katie [name]/Extender: Glean Hess in Treatment: 0 Encounter Discharge Information Items Post Procedure Vitals Discharge Condition: Stable Temperature (F): 97.8 Ambulatory Status: Wheelchair Pulse (bpm): 70 Discharge Destination: Home Respiratory Rate (breaths/min): 18 Transportation: Private Auto Blood Pressure (mmHg): 125/78 Accompanied By: Charolett Bumpers Schedule Follow-up Appointment: Yes Clinical Summary of Care: Patient Declined Electronic Signature(s) Signed: 10/05/2019 5:49:13 PM By: Katie Gouty RN, BSN Entered By: Katie [name] on 10/05/2019 12:01:37 -------------------------------------------------------------------------------- Lower Extremity Assessment Details Patient Name: Date of Service: Katie [name], [place] [name] 10/05/2019 9:00 A M Medical Record Number: [id number] Patient Account Number: [id number] Date of Birth/Sex: Treating RN: 1940-11-15 (79 y.o. Female) Carlene Coria Primary Care Khizar Fiorella: Kirtland Bouchard Other Clinician: Referring Katie [name]: Treating Macey Wurtz/Extender: Glean Hess in  Treatment: 0 Edema Assessment Assessed: Shirlyn Goltz: No] [Right: No] E[Left: dema] [Right: :] Calf Left: Right: Point of Measurement: 38 cm From Medial Instep 42.5 cm 43 cm Ankle Left: Right: Point of Measurement: 11 cm From Medial Instep 23 cm 23 cm Electronic Signature(s) Signed: 10/05/2019 5:42:58 PM By: Carlene Coria RN Entered By: Carlene Coria on 10/05/2019 09:50:02 -------------------------------------------------------------------------------- Multi Wound Chart Details Patient Name: Date of Service: Katie [name] 10/05/2019 9:00 A M Medical Record Number: [id number] Patient Account Number: [id number] Date of Birth/Sex: Treating RN: November 16, 1940 (79 y.o. Female) Katie [name] Primary Care [facility] [place]: Kirtland Bouchard Other Clinician: Referring Lugene Hitt: Treating Lorn Butcher/Extender: Glean Hess in Treatment: 0 Vital Signs Height(in): 43 Pulse(bpm): 52 Weight(lbs): 26 Blood Pressure(mmHg): 128/55 Body Mass Index(BMI): 32 Temperature(F): 97.8 Respiratory Rate(breaths/min): 18 Photos: [1:No Photos Right, Dorsal Foot] [2:No Photos Left, Medial Foot] [3:No Photos Right Hand - 1st Digit] Wound Location: [1:Trauma] [2:Trauma] [3:Trauma] Wounding Event: [1:Trauma, Other] [2:Trauma, Other] [3:Trauma, Other] Primary Etiology: [1:N/A] [2:Inflammatory] [3:N/A] Secondary Etiology: [1:Chronic Obstructive Pulmonary] [2:Chronic Obstructive Pulmonary] [3:Chronic Obstructive Pulmonary] Comorbid History: [1:Disease (COPD), Congestive Heart Disease (COPD), Congestive Heart Failure, Hypertension, Peripheral Venous Disease 08/14/2019] [2:Failure, Hypertension, Peripheral Venous Disease 08/14/2019] [3:Disease (COPD), Congestive Heart Failure,  Hypertension, Peripheral Venous Disease 09/13/2019] Date Acquired: [1:0] [2:0] [3:0] Weeks of Treatment: [1:Open] [2:Open] [3:Open] Wound Status: [1:2.8x1.5x0.1] [2:0.4x0.4x0.1] [3:0.5x0.1x0.2] Measurements L x W x D (cm) [1:3.299]  [2:0.126] [3:0.039] A (cm) : rea [1:0.33] [2:0.013] [3:0.008] Volume (cm) : [1:0.00%] [2:0.00%] [3:0.00%] % Reduction in A [1:rea: 0.00%] [2:0.00%] [3:0.00%] % Reduction in Volume: [1:Full Thickness Without Exposed] [2:Full Thickness Without Exposed] [3:Full Thickness Without Exposed] Classification: [1:Support Structures Medium] [2:Support Structures Medium] [3:Support Structures Small] Exudate A mount: [1:Serosanguineous] [2:Serosanguineous] [3:Serosanguineous] Exudate Type: [1:red, brown] [2:red, brown] [3:red, brown] Exudate Color: [1:N/A] [2:N/A] [3:Flat and Intact] Wound Margin: [1:Small (1-33%)] [2:None Present (0%)] [3:Large (67-100%)] Granulation A mount: [1:Pink] [2:N/A] [3:Red] Granulation Quality: [1:Large (67-100%)] [2:Large (67-100%)] [3:None Present (0%)] Necrotic A mount: [1:Fat Layer (Subcutaneous Tissue): Yes Fascia: No] [3:Fat Layer (Subcutaneous Tissue): Yes] Exposed Structures: [1:Fascia: No Tendon: No Muscle: No Joint: No Bone: No None] [2:Fat Layer (Subcutaneous Tissue): No Tendon: No Muscle: No Joint: No Bone: No None] [3:Fascia: No Tendon: No Muscle: No Joint: No Bone: No None] Epithelialization: [1:Debridement - Excisional] [2:Debridement - Excisional] [3:N/A] Debridement: Pre-procedure Verification/Time Out 10:19 [  2:10:19] [3:N/A] Taken: [1:Other] [2:Other] [3:N/A] Pain Control: [1:Subcutaneous] [2:Subcutaneous] [3:N/A] Tissue Debrided: [1:Skin/Subcutaneous Tissue] [2:Skin/Subcutaneous Tissue] [3:N/A] Level: [1:4.2] [2:0.16] [3:N/A] Debridement A (sq cm): [1:rea Curette] [2:Curette] [3:N/A] Instrument: [1:Minimum] [2:Minimum] [3:N/A] Bleeding: [1:Pressure] [2:Pressure] [3:N/A] Hemostasis A chieved: [1:0] [2:0] [3:N/A] Procedural Pain: [1:0] [2:0] [3:N/A] Post Procedural Pain: [1:Procedure was tolerated well] [2:Procedure was tolerated well] [3:N/A] Debridement Treatment Response: [1:2.8x1.5x0.1] [2:0.4x0.4x0.1] [3:N/A] Post Debridement Measurements L x W x  D (cm) [1:0.33] [2:0.013] [3:N/A] Post Debridement Volume: (cm) [1:Debridement] [2:Debridement] [3:N/A] Treatment Notes Electronic Signature(s) Signed: 10/05/2019 5:20:32 PM By: Linton Ham MD Signed: 10/05/2019 5:51:32 PM By: Katie [name] Entered By: Linton Ham on 10/05/2019 11:02:19 -------------------------------------------------------------------------------- Multi-Disciplinary Care Plan Details Patient Name: Date of Service: Katie [name], [place] [name] 10/05/2019 9:00 A M Medical Record Number: [id number] Patient Account Number: [id number] Date of Birth/Sex: Treating RN: 01/14/1941 (79 y.o. Female) Katie [name] Primary Care Jobany Montellano: Kirtland Bouchard Other Clinician: Referring Raelan Burgoon: Treating Ayinde Swim/Extender: Glean Hess in Treatment: 0 Active Inactive Electronic Signature(s) Signed: 12/03/2019 1:34:18 PM By: Katie [name] Previous Signature: 10/05/2019 5:51:32 PM Version By: Katie [name] Entered By: Katie [name] on 10/18/2019 16:39:00 -------------------------------------------------------------------------------- Pain Assessment Details Patient Name: Date of Service: Katie [name] 10/05/2019 9:00 A M Medical Record Number: [id number] Patient Account Number: [id number] Date of Birth/Sex: Treating RN: Dec 07, 1940 (79 y.o. Female) Carlene Coria Primary Care Kloe Oates: Kirtland Bouchard Other Clinician: Referring Darly Massi: Treating Kaoru Rezendes/Extender: Glean Hess in Treatment: 0 Active Problems Location of Pain Severity and Description of Pain Patient Has Paino No Site Locations Pain Management and Medication Current Pain Management: Electronic Signature(s) Signed: 10/05/2019 5:42:58 PM By: Carlene Coria RN Entered By: Carlene Coria on 10/05/2019 09:59:23 -------------------------------------------------------------------------------- Patient/Caregiver Education Details Patient  Name: Date of Service: Katie [name] 7/23/2021andnbsp9:00 [place] Record Number: [id number] Patient Account Number: [id number] Date of Birth/Gender: Treating RN: 10-09-1940 (79 y.o. Female) Katie [name] Primary Care Physician: Kirtland Bouchard Other Clinician: Referring Physician: Treating Physician/Extender: Glean Hess in Treatment: 0 Education Assessment Education Provided To: Patient Education Topics Provided Welcome T [facility] [place]: o Handouts: Welcome T [facility] [place] o Methods: Explain/Verbal Responses: State content correctly Wound/Skin Impairment: Handouts: Caring for Your Ulcer Methods: Explain/Verbal Responses: State content correctly Electronic Signature(s) Signed: 10/05/2019 5:51:32 PM By: Katie [name] Entered By: Katie [name] on 10/05/2019 10:13:42 -------------------------------------------------------------------------------- Wound Assessment Details Patient Name: Date of Service: Katie [name] 10/05/2019 9:00 A M Medical Record Number: [id number] Patient Account Number: [id number] Date of Birth/Sex: Treating RN: Dec 18, 1940 (79 y.o. Female) Carlene Coria Primary Care [place]: Kirtland Bouchard Other Clinician: Referring Jaythan Hinely: Treating Kyisha Fowle/Extender: Glean Hess in Treatment: 0 Wound Status Wound Number: 1 Primary Trauma, Other Etiology: Wound Location: Right, Dorsal Foot Wound Open Wounding Event: Trauma Status: Date Acquired: 08/14/2019 Comorbid Chronic Obstructive Pulmonary Disease (COPD), Congestive Weeks Of Treatment: 0 History: Heart Failure, Hypertension, Peripheral Venous Disease Clustered Wound: No Photos Photo Uploaded By: Mikeal Hawthorne on 10/05/2019 12:57:23 Wound Measurements Length: (cm) 2.8 Width: (cm) 1.5 Depth: (cm) 0.1 Area: (cm) 3.299 Volume: (cm) 0.33 % Reduction in Area: 0% % Reduction in Volume:  0% Epithelialization: None Tunneling: No Undermining: No Wound Description Classification: Full Thickness Without Exposed Support Structures Exudate Amount: Medium Exudate Type: Serosanguineous Exudate Color: red, brown Foul Odor After Cleansing: No Slough/Fibrino Yes Wound Bed Granulation Amount: Small (1-33%) Exposed Structure Granulation Quality: Pink Fascia Exposed: No Necrotic Amount: Large (67-100%) Fat  Layer (Subcutaneous Tissue) Exposed: Yes Necrotic Quality: Adherent Slough Tendon Exposed: No Muscle Exposed: No Joint Exposed: No Bone Exposed: No Electronic Signature(s) Signed: 10/05/2019 5:42:58 PM By: Carlene Coria RN Signed: 10/05/2019 5:51:32 PM By: Katie [name] Entered By: Katie [name] on 10/05/2019 10:22:14 -------------------------------------------------------------------------------- Wound Assessment Details Patient Name: Date of Service: Katie [name] 10/05/2019 9:00 A M Medical Record Number: [id number] Patient Account Number: [id number] Date of Birth/Sex: Treating RN: 10-29-1940 (79 y.o. Female) Carlene Coria Primary Care Valory Wetherby: Kirtland Bouchard Other Clinician: Referring Charmion Hapke: Treating Keyarah Mcroy/Extender: Glean Hess in Treatment: 0 Wound Status Wound Number: 2 Primary Trauma, Other Etiology: Wound Location: Left, Medial Foot Secondary Inflammatory Wounding Event: Trauma Etiology: Date Acquired: 08/14/2019 Wound Open Weeks Of Treatment: 0 Status: Clustered Wound: No Comorbid Chronic Obstructive Pulmonary Disease (COPD), Congestive History: Heart Failure, Hypertension, Peripheral Venous Disease Photos Photo Uploaded By: Mikeal Hawthorne on 10/05/2019 12:57:23 Wound Measurements Length: (cm) 0.4 Width: (cm) 0.4 Depth: (cm) 0.1 Area: (cm) 0.126 Volume: (cm) 0.013 % Reduction in Area: 0% % Reduction in Volume: 0% Epithelialization: None Tunneling: No Undermining: No Wound  Description Classification: Full Thickness Without Exposed Support Structures Exudate Amount: Medium Exudate Type: Serosanguineous Exudate Color: red, brown Foul Odor After Cleansing: No Slough/Fibrino Yes Wound Bed Granulation Amount: None Present (0%) Exposed Structure Necrotic Amount: Large (67-100%) Fascia Exposed: No Necrotic Quality: Adherent Slough Fat Layer (Subcutaneous Tissue) Exposed: No Tendon Exposed: No Muscle Exposed: No Joint Exposed: No Bone Exposed: No Electronic Signature(s) Signed: 10/05/2019 5:42:58 PM By: Carlene Coria RN Signed: 10/05/2019 5:51:32 PM By: Katie [name] Entered By: Katie [name] on 10/05/2019 10:22:30 -------------------------------------------------------------------------------- Wound Assessment Details Patient Name: Date of Service: Katie [name] 10/05/2019 9:00 A M Medical Record Number: [id number] Patient Account Number: [id number] Date of Birth/Sex: Treating RN: 03-Aug-1940 (79 y.o. Female) Carlene Coria Primary Care Quinn Quam: Kirtland Bouchard Other Clinician: Referring Kiyanna Biegler: Treating Joanthony Hamza/Extender: Glean Hess in Treatment: 0 Wound Status Wound Number: 3 Primary Trauma, Other Etiology: Wound Location: Right Hand - 1st Digit Wound Open Wounding Event: Trauma Status: Date Acquired: 09/13/2019 Comorbid Chronic Obstructive Pulmonary Disease (COPD), Congestive Weeks Of Treatment: 0 History: Heart Failure, Hypertension, Peripheral Venous Disease Clustered Wound: No Photos Photo Uploaded By: Mikeal Hawthorne on 10/05/2019 13:07:56 Wound Measurements Length: (cm) 0.5 Width: (cm) 0.1 Depth: (cm) 0.2 Area: (cm) 0.039 Volume: (cm) 0.008 % Reduction in Area: 0% % Reduction in Volume: 0% Epithelialization: None Tunneling: No Undermining: No Wound Description Classification: Full Thickness Without Exposed Support Structures Wound Margin: Flat and Intact Exudate Amount: Small Exudate  Type: Serosanguineous Exudate Color: red, brown Foul Odor After Cleansing: No Slough/Fibrino No Wound Bed Granulation Amount: Large (67-100%) Exposed Structure Granulation Quality: Red Fascia Exposed: No Necrotic Amount: None Present (0%) Fat Layer (Subcutaneous Tissue) Exposed: Yes Tendon Exposed: No Muscle Exposed: No Joint Exposed: No Bone Exposed: No Electronic Signature(s) Signed: 10/05/2019 5:42:58 PM By: Carlene Coria RN Signed: 10/05/2019 5:51:32 PM By: Katie [name] Entered By: Katie [name] on 10/05/2019 10:22:46 -------------------------------------------------------------------------------- Vitals Details Patient Name: Date of Service: Katie [name] 10/05/2019 9:00 A M Medical Record Number: [id number] Patient Account Number: [id number] Date of Birth/Sex: Treating RN: 1940/04/04 (79 y.o. Female) Katie [name] Primary Care Harlynn Kimbell: Kirtland Bouchard Other Clinician: Referring Harvir Patry: Treating Nandita Mathenia/Extender: Glean Hess in Treatment: 0 Vital Signs Time Taken: 09:25 Temperature (F): 97.8 Height (in): 63 Pulse (bpm): 70 Source: Stated Respiratory Rate (breaths/min): 18 Weight (lbs): 178 Blood Pressure (mmHg):  128/55 Source: Stated Reference Range: 80 - 120 mg / dl Body Mass Index (BMI): 31.5 Electronic Signature(s) Signed: 10/05/2019 5:42:58 PM By: Carlene Coria RN Entered By: Carlene Coria on 10/05/2019 09:31:17

## 2019-10-05 NOTE — Telephone Encounter (Signed)
Patient called from the wound care center and complained of lower extremity edema. Patient was advised to increase her Torsemide 20 mg from 2 tablets in the morning to 2 tablets twice a day. Patient came to the office and Dr Melford Aase ordered a CBC and CMET, which was drawn today. Her dressing from the wound care center was re-enforced, due to excessive bleeding. Patient has a hospital follow up on 10/08/2019 with Irving Shows.

## 2019-10-05 NOTE — Progress Notes (Signed)
KENNIDY, LAMKE ([id number]) Visit Report for 10/05/2019 Chief Complaint Document Details Patient Name: Date of Service: Katie [name] 10/05/2019 9:00 A M Medical Record Number: [id number] Patient Account Number: [id number] Date of Birth/Sex: Treating RN: Aug 09, 1940 (79 y.o. Female) Kela Millin Primary Care Provider: Kirtland Bouchard Other Clinician: Referring Provider: Treating Provider/Extender: Glean Hess in Treatment: 0 Information Obtained from: Patient Chief Complaint 10/05/2019; patient is here for review of areas on her right dorsal foot and left medial foot. Electronic Signature(s) Signed: 10/05/2019 5:20:32 PM By: Linton Ham MD Entered By: Linton Ham on 10/05/2019 11:03:08 -------------------------------------------------------------------------------- Debridement Details Patient Name: Date of Service: Katie Sax, MA RTHA 10/05/2019 9:00 A M Medical Record Number: [id number] Patient Account Number: [id number] Date of Birth/Sex: Treating RN: Oct 14, 1940 (79 y.o. Female) Kela Millin Primary Care Provider: Kirtland Bouchard Other Clinician: Referring Provider: Treating Provider/Extender: Glean Hess in Treatment: 0 Debridement Performed for Assessment: Wound #1 Right,Dorsal Foot Performed By: Physician Ricard Dillon., MD Debridement Type: Debridement Level of Consciousness (Pre-procedure): Awake and Alert Pre-procedure Verification/Time Out Yes - 10:19 Taken: Start Time: 10:19 Pain Control: Other : benzocaine, 20% T Area Debrided (L x W): otal 2.8 (cm) x 1.5 (cm) = 4.2 (cm) Tissue and other material debrided: Viable, Non-Viable, Subcutaneous Level: Skin/Subcutaneous Tissue Debridement Description: Excisional Instrument: Curette Bleeding: Minimum Hemostasis Achieved: Pressure End Time: 10:20 Procedural Pain: 0 Post Procedural Pain: 0 Response to Treatment: Procedure was tolerated  well Level of Consciousness (Post- Awake and Alert procedure): Post Debridement Measurements of Total Wound Length: (cm) 2.8 Width: (cm) 1.5 Depth: (cm) 0.1 Volume: (cm) 0.33 Character of Wound/Ulcer Post Debridement: Improved Post Procedure Diagnosis Same as Pre-procedure Electronic Signature(s) Signed: 10/05/2019 5:20:32 PM By: Linton Ham MD Signed: 10/05/2019 5:51:32 PM By: Kela Millin Entered By: Linton Ham on 10/05/2019 11:02:32 -------------------------------------------------------------------------------- Debridement Details Patient Name: Date of Service: Katie Sax, MA RTHA 10/05/2019 9:00 A M Medical Record Number: [id number] Patient Account Number: [id number] Date of Birth/Sex: Treating RN: 10/26/1940 (79 y.o. Female) Kela Millin Primary Care Provider: Kirtland Bouchard Other Clinician: Referring Provider: Treating Provider/Extender: Glean Hess in Treatment: 0 Debridement Performed for Assessment: Wound #2 Left,Medial Foot Performed By: Physician Ricard Dillon., MD Debridement Type: Debridement Level of Consciousness (Pre-procedure): Awake and Alert Pre-procedure Verification/Time Out Yes - 10:19 Taken: Start Time: 10:19 Pain Control: Other : benzocaine, 20% T Area Debrided (L x W): otal 0.4 (cm) x 0.4 (cm) = 0.16 (cm) Tissue and other material debrided: Viable, Non-Viable, Subcutaneous Level: Skin/Subcutaneous Tissue Debridement Description: Excisional Instrument: Curette Bleeding: Minimum Hemostasis Achieved: Pressure End Time: 10:20 Procedural Pain: 0 Post Procedural Pain: 0 Response to Treatment: Procedure was tolerated well Level of Consciousness (Post- Awake and Alert procedure): Post Debridement Measurements of Total Wound Length: (cm) 0.4 Width: (cm) 0.4 Depth: (cm) 0.1 Volume: (cm) 0.013 Character of Wound/Ulcer Post Debridement: Improved Post Procedure Diagnosis Same as  Pre-procedure Electronic Signature(s) Signed: 10/05/2019 5:20:32 PM By: Linton Ham MD Signed: 10/05/2019 5:51:32 PM By: Kela Millin Entered By: Linton Ham on 10/05/2019 11:02:40 -------------------------------------------------------------------------------- HPI Details Patient Name: Date of Service: Katie Sax, MA RTHA 10/05/2019 9:00 A M Medical Record Number: [id number] Patient Account Number: [id number] Date of Birth/Sex: Treating RN: 09-26-1940 (79 y.o. Female) Kela Millin Primary Care Provider: Kirtland Bouchard Other Clinician: Referring Provider: Treating Provider/Extender: Glean Hess in Treatment: 0 History of Present Illness HPI Description: ADMISSION 10/05/2019 This is a 79 year old woman who  apparently had a fall 2 months ago. She had an abrasion wound on the right dorsal foot the left medial foot and her right thumb. These have been very slow to heal. She had arterial studies done on 7/ and these were really quite unremarkable with a ABI in the right of 1.14, TBI of 0.80 with triphasic waveforms. On the left ABI 1.06 with a TBI of 0.73 and again triphasic waveforms. She was recently hospitalized with a diverticular bleed she apparently received fluids and a transfusion. She was also felt to have oral esophagitis secondary to yeast. She was discharged 3 days ago. She has home health going out applying silver alginate to the wounds. Past medical history is extensive; hypertension, bilateral lower extremity edema, chronic venous insufficiency, combined systolic and diastolic heart failure, rheumatoid arthritis, paroxysmal A. fib on Xarelto, chronic low back pain, stage IIIb chronic renal failure, upper GI bleed in 2020, COPD, hyperlipidemia and an AAA repair Electronic Signature(s) Signed: 10/05/2019 5:20:32 PM By: Linton Ham MD Entered By: Linton Ham on 10/05/2019  11:05:53 -------------------------------------------------------------------------------- Physical Exam Details Patient Name: Date of Service: Katie Sax, MA RTHA 10/05/2019 9:00 A M Medical Record Number: [id number] Patient Account Number: [id number] Date of Birth/Sex: Treating RN: 02/17/1941 (79 y.o. Female) Kela Millin Primary Care Provider: Kirtland Bouchard Other Clinician: Referring Provider: Treating Provider/Extender: Glean Hess in Treatment: 0 Constitutional Sitting or standing Blood Pressure is within target range for patient.. Pulse regular and within target range for patient.Marland Kitchen Respirations regular, non-labored and within target range.. Temperature is normal and within the target range for the patient.Marland Kitchen Appears in no distress. Respiratory work of breathing is normal. Bilateral breath sounds are clear and equal in all lobes with no wheezes, rales or rhonchi.. Cardiovascular JVP is elevated with a tall V wave. Rhythm seemed regular 3-6 systolic ejection murmur at the aortic area and I believe a diastolic soft insufficiency murmur. Pedal pulses are palpable. She has 3+ pitting edema all the way up into her upper thighs bilaterally no signs of a DVT or cellulitis. Notes Wound exam The patient has a circular area on the right dorsal foot. Adherent debris on the surface I used a #3 curette to remove this hemostasis with direct pressure. Similar type wound on the medial left first metatarsal head also debrided with a #3 curette I am really thinking these will close fairly quickly She had a slight abrasion of the right thumb. Electronic Signature(s) Signed: 10/05/2019 5:20:32 PM By: Linton Ham MD Entered By: Linton Ham on 10/05/2019 11:07:36 -------------------------------------------------------------------------------- Physician Orders Details Patient Name: Date of Service: Katie Sax, MA RTHA 10/05/2019 9:00 A M Medical Record Number:  [id number] Patient Account Number: [id number] Date of Birth/Sex: Treating RN: 1940-11-02 (79 y.o. Female) Kela Millin Primary Care Provider: Kirtland Bouchard Other Clinician: Referring Provider: Treating Provider/Extender: Glean Hess in Treatment: 0 Verbal / Phone Orders: No Diagnosis Coding Follow-up Appointments Return Appointment in 2 weeks. Dressing Change Frequency Change Dressing every other day. - HH Monday/Wednesday/Friday on weeks that patient is not seen at wound care clinic Skin Barriers/Peri-Wound Care Skin Prep Wound Cleansing Clean wound with Wound Cleanser Primary Wound Dressing Wound #1 Right,Dorsal Foot Collagen Wound #2 Left,Medial Foot Collagen Wound #3 Right Hand - 1st Digit Other: - antibiotic ointment and bandaid Secondary Dressing Wound #1 Right,Dorsal Foot Foam Border Wound #2 Left,Medial Foot Foam Border Wound #3 Right Hand - 1st Digit Foam [place] skilled nursing for  wound care. Alvis Lemmings Electronic Signature(s) Signed: 10/05/2019 5:20:32 PM By: Linton Ham MD Signed: 10/05/2019 5:51:32 PM By: Kela Millin Entered By: Kela Millin on 10/05/2019 10:25:54 -------------------------------------------------------------------------------- Problem List Details Patient Name: Date of Service: Katie Sax, MA RTHA 10/05/2019 9:00 A M Medical Record Number: [id number] Patient Account Number: [id number] Date of Birth/Sex: Treating RN: October 19, 1940 (79 y.o. Female) Kela Millin Primary Care Provider: Kirtland Bouchard Other Clinician: Referring Provider: Treating Provider/Extender: Glean Hess in Treatment: 0 Active Problems ICD-10 Encounter Code Description Active Date MDM Diagnosis S90.811D Abrasion, right foot, subsequent encounter 10/05/2019 No Yes S90.812D Abrasion, left foot, subsequent encounter 10/05/2019 No Yes L97.518 Non-pressure  chronic ulcer of other part of right foot with other specified 10/05/2019 No Yes severity L97.528 Non-pressure chronic ulcer of other part of left foot with other specified 10/05/2019 No Yes severity Inactive Problems Resolved Problems Electronic Signature(s) Signed: 10/05/2019 5:20:32 PM By: Linton Ham MD Entered By: Linton Ham on 10/05/2019 10:43:08 -------------------------------------------------------------------------------- Progress Note Details Patient Name: Date of Service: Katie Sax, MA RTHA 10/05/2019 9:00 A M Medical Record Number: [id number] Patient Account Number: [id number] Date of Birth/Sex: Treating RN: 1941-01-11 (79 y.o. Female) Kela Millin Primary Care Provider: Kirtland Bouchard Other Clinician: Referring Provider: Treating Provider/Extender: Glean Hess in Treatment: 0 Subjective Chief Complaint Information obtained from Patient 10/05/2019; patient is here for review of areas on her right dorsal foot and left medial foot. History of Present Illness (HPI) ADMISSION 10/05/2019 This is a 79 year old woman who apparently had a fall 2 months ago. She had an abrasion wound on the right dorsal foot the left medial foot and her right thumb. These have been very slow to heal. She had arterial studies done on 7/ and these were really quite unremarkable with a ABI in the right of 1.14, TBI of 0.80 with triphasic waveforms. On the left ABI 1.06 with a TBI of 0.73 and again triphasic waveforms. She was recently hospitalized with a diverticular bleed she apparently received fluids and a transfusion. She was also felt to have oral esophagitis secondary to yeast. She was discharged 3 days ago. She has home health going out applying silver alginate to the wounds. Past medical history is extensive; hypertension, bilateral lower extremity edema, chronic venous insufficiency, combined systolic and diastolic heart failure, rheumatoid arthritis,  paroxysmal A. fib on Xarelto, chronic low back pain, stage IIIb chronic renal failure, upper GI bleed in 2020, COPD, hyperlipidemia and an AAA repair Patient History Information obtained from Patient. Allergies amiodarone, Diovan, doxycycline, Flexeril, Keflex, verapamil, codeine Family History Cancer - Siblings, Hypertension - Child, Kidney Disease - Child, Lung Disease - Child, Stroke - Child, Thyroid Problems - Child, No family history of Diabetes, Heart Disease, Hereditary Spherocytosis, Seizures, Tuberculosis. Social History Former smoker, Marital Status - Married, Alcohol Use - Rarely, Caffeine Use - Moderate. Medical History Eyes Denies history of Cataracts, Glaucoma, Optic Neuritis Ear/Nose/Mouth/Throat Denies history of Chronic sinus problems/congestion, Middle ear problems Hematologic/Lymphatic Denies history of Anemia, Hemophilia, Human Immunodeficiency Virus, Lymphedema, Sickle Cell Disease Respiratory Patient has history of Chronic Obstructive Pulmonary Disease (COPD) Denies history of Aspiration, Asthma, Pneumothorax, Sleep Apnea, Tuberculosis Cardiovascular Patient has history of Congestive Heart Failure, Hypertension, Peripheral Venous Disease Denies history of Angina, Arrhythmia, Coronary Artery Disease, Deep Vein Thrombosis, Hypotension, Myocardial Infarction, Peripheral Arterial Disease, Phlebitis, Vasculitis Endocrine Denies history of Type I Diabetes, Type II Diabetes Genitourinary Denies history of End Stage Renal Disease Immunological Denies history of Lupus Erythematosus, Raynaudoos, Scleroderma  Integumentary (Skin) Denies history of History of Burn Musculoskeletal Denies history of Gout, Rheumatoid Arthritis, Osteoarthritis Neurologic Denies history of Dementia, Neuropathy, Quadriplegia, Paraplegia, Seizure Disorder Oncologic Denies history of Received Chemotherapy, Received Radiation Psychiatric Denies history of Anorexia/bulimia, Confinement  Anxiety Review of Systems (ROS) Constitutional Symptoms (General Health) Denies complaints or symptoms of Fatigue, Fever, Chills, Marked Weight Change. Eyes Denies complaints or symptoms of Dry Eyes, Vision Changes, Glasses / Contacts. Ear/Nose/Mouth/Throat Denies complaints or symptoms of Chronic sinus problems or rhinitis. Cardiovascular Denies complaints or symptoms of Chest pain. Endocrine Denies complaints or symptoms of Heat/cold intolerance. Genitourinary Denies complaints or symptoms of Frequent urination. Integumentary (Skin) Complains or has symptoms of Wounds. Musculoskeletal Denies complaints or symptoms of Muscle Pain, Muscle Weakness. Neurologic Denies complaints or symptoms of Numbness/parasthesias. Psychiatric Denies complaints or symptoms of Claustrophobia, Suicidal. Objective Constitutional Sitting or standing Blood Pressure is within target range for patient.. Pulse regular and within target range for patient.Marland Kitchen Respirations regular, non-labored and within target range.. Temperature is normal and within the target range for the patient.Marland Kitchen Appears in no distress. Vitals Time Taken: 9:25 AM, Height: 63 in, Source: Stated, Weight: 178 lbs, Source: Stated, BMI: 31.5, Temperature: 97.8 F, Pulse: 70 bpm, Respiratory Rate: 18 breaths/min, Blood Pressure: 128/55 mmHg. Respiratory work of breathing is normal. Bilateral breath sounds are clear and equal in all lobes with no wheezes, rales or rhonchi.. Cardiovascular JVP is elevated with a tall V wave. Rhythm seemed regular 3-6 systolic ejection murmur at the aortic area and I believe a diastolic soft insufficiency murmur. Pedal pulses are palpable. She has 3+ pitting edema all the way up into her upper thighs bilaterally no signs of a DVT or cellulitis. General Notes: Wound exam ooThe patient has a circular area on the right dorsal foot. Adherent debris on the surface I used a #3 curette to remove this hemostasis with  direct pressure. Similar type wound on the medial left first metatarsal head also debrided with a #3 curette I am really thinking these will close fairly quickly ooShe had a slight abrasion of the right thumb. Integumentary (Hair, Skin) Wound #1 status is Open. Original cause of wound was Trauma. The wound is located on the Right,Dorsal Foot. The wound measures 2.8cm length x 1.5cm width x 0.1cm depth; 3.299cm^2 area and 0.33cm^3 volume. There is Fat Layer (Subcutaneous Tissue) Exposed exposed. There is no tunneling or undermining noted. There is a medium amount of serosanguineous drainage noted. There is small (1-33%) pink granulation within the wound bed. There is a large (67-100%) amount of necrotic tissue within the wound bed including Adherent Slough. Wound #2 status is Open. Original cause of wound was Trauma. The wound is located on the Left,Medial Foot. The wound measures 0.4cm length x 0.4cm width x 0.1cm depth; 0.126cm^2 area and 0.013cm^3 volume. There is no tunneling or undermining noted. There is a medium amount of serosanguineous drainage noted. There is no granulation within the wound bed. There is a large (67-100%) amount of necrotic tissue within the wound bed including Adherent Slough. Wound #3 status is Open. Original cause of wound was Trauma. The wound is located on the Right Hand - 1st Digit. The wound measures 0.5cm length x 0.1cm width x 0.2cm depth; 0.039cm^2 area and 0.008cm^3 volume. There is Fat Layer (Subcutaneous Tissue) Exposed exposed. There is no tunneling or undermining noted. There is a small amount of serosanguineous drainage noted. The wound margin is flat and intact. There is large (67-100%) red granulation within the wound bed.  There is no necrotic tissue within the wound bed. Assessment Active Problems ICD-10 Abrasion, right foot, subsequent encounter Abrasion, left foot, subsequent encounter Non-pressure chronic ulcer of other part of right foot with  other specified severity Non-pressure chronic ulcer of other part of left foot with other specified severity Procedures Wound #1 Pre-procedure diagnosis of Wound #1 is a Trauma, Other located on the Right,Dorsal Foot . There was a Excisional Skin/Subcutaneous Tissue Debridement with a total area of 4.2 sq cm performed by Ricard Dillon., MD. With the following instrument(s): Curette to remove Viable and Non-Viable tissue/material. Material removed includes Subcutaneous Tissue after achieving pain control using Other (benzocaine, 20%). No specimens were taken. A time out was conducted at 10:19, prior to the start of the procedure. A Minimum amount of bleeding was controlled with Pressure. The procedure was tolerated well with a pain level of 0 throughout and a pain level of 0 following the procedure. Post Debridement Measurements: 2.8cm length x 1.5cm width x 0.1cm depth; 0.33cm^3 volume. Character of Wound/Ulcer Post Debridement is improved. Post procedure Diagnosis Wound #1: Same as Pre-Procedure Wound #2 Pre-procedure diagnosis of Wound #2 is a Trauma, Other located on the Left,Medial Foot . There was a Excisional Skin/Subcutaneous Tissue Debridement with a total area of 0.16 sq cm performed by Ricard Dillon., MD. With the following instrument(s): Curette to remove Viable and Non-Viable tissue/material. Material removed includes Subcutaneous Tissue after achieving pain control using Other (benzocaine, 20%). No specimens were taken. A time out was conducted at 10:19, prior to the start of the procedure. A Minimum amount of bleeding was controlled with Pressure. The procedure was tolerated well with a pain level of 0 throughout and a pain level of 0 following the procedure. Post Debridement Measurements: 0.4cm length x 0.4cm width x 0.1cm depth; 0.013cm^3 volume. Character of Wound/Ulcer Post Debridement is improved. Post procedure Diagnosis Wound #2: Same as Pre-Procedure Plan Follow-up  Appointments: Return Appointment in 2 weeks. Dressing Change Frequency: Change Dressing every other day. - HH Monday/Wednesday/Friday on weeks that patient is not seen at wound care clinic Skin Barriers/Peri-Wound Care: Skin Prep Wound Cleansing: Clean wound with Wound Cleanser Primary Wound Dressing: Wound #1 Right,Dorsal Foot: Collagen Wound #2 Left,Medial Foot: Collagen Wound #3 Right Hand - 1st Digit: Other: - antibiotic ointment and bandaid Secondary Dressing: Wound #1 Right,Dorsal Foot: Foam Border Wound #2 Left,Medial Foot: Foam Border Wound #3 Right Hand - 1st Digit: Foam Border Home Health: [place] skilled [facility] for wound care. Alvis Lemmings #1 We are going to apply Collagen and border foam to both of these areas. The patient has pitting edema up into her groin and signs of systemic fluid volume overload I think she will need an increase in her torsemide I asked her to call her primary doctor or her cardiologist 2. 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 Her toes are generally erythematous and cold but rapidly change color. Also true in her hands. 3. The small abrasion on her right thumb looks benign. I recommended topical antibiotic and a Band-Aid 4. I suspect this woman has valvular heart disease but I have not looked at her cardiac status. She also has renal failure in the face of a recent hospitalization with a GI bleed likely received a lot of fluid resuscitation. I think this probably explains her edema I spent 35 minutes in review of this patient's past medical history, face-to-face evaluation and preparation of this record Electronic Signature(s) Signed: 10/05/2019 5:20:32 PM By: Dellia Nims,  Legrand Como MD Entered By: Linton Ham on 10/05/2019 11:10:03 -------------------------------------------------------------------------------- HxROS Details Patient Name: Date of Service: Katie Sax, MA RTHA 10/05/2019 9:00 A M Medical Record Number:  [id number] Patient Account Number: [id number] Date of Birth/Sex: Treating RN: 1941/01/01 (79 y.o. Female) Carlene Coria Primary Care Provider: Kirtland Bouchard Other Clinician: Referring Provider: Treating Provider/Extender: Glean Hess in Treatment: 0 Information Obtained From Patient Constitutional Symptoms (General Health) Complaints and Symptoms: Negative for: Fatigue; Fever; Chills; Marked Weight Change Eyes Complaints and Symptoms: Negative for: Dry Eyes; Vision Changes; Glasses / Contacts Medical History: Negative for: Cataracts; Glaucoma; Optic Neuritis Ear/Nose/Mouth/Throat Complaints and Symptoms: Negative for: Chronic sinus problems or rhinitis Medical History: Negative for: Chronic sinus problems/congestion; Middle ear problems Cardiovascular Complaints and Symptoms: Negative for: Chest pain Medical History: Positive for: Congestive Heart Failure; Hypertension; Peripheral Venous Disease Negative for: Angina; Arrhythmia; Coronary Artery Disease; Deep Vein Thrombosis; Hypotension; Myocardial Infarction; Peripheral Arterial Disease; Phlebitis; Vasculitis Endocrine Complaints and Symptoms: Negative for: Heat/cold intolerance Medical History: Negative for: Type I Diabetes; Type II Diabetes Genitourinary Complaints and Symptoms: Negative for: Frequent urination Medical History: Negative for: End Stage Renal Disease Integumentary (Skin) Complaints and Symptoms: Positive for: Wounds Medical History: Negative for: History of Burn Musculoskeletal Complaints and Symptoms: Negative for: Muscle Pain; Muscle Weakness Medical History: Negative for: Gout; Rheumatoid Arthritis; Osteoarthritis Neurologic Complaints and Symptoms: Negative for: Numbness/parasthesias Medical History: Negative for: Dementia; Neuropathy; Quadriplegia; Paraplegia; Seizure Disorder Psychiatric Complaints and Symptoms: Negative for: Claustrophobia;  Suicidal Medical History: Negative for: Anorexia/bulimia; Confinement Anxiety Hematologic/Lymphatic Medical History: Negative for: Anemia; Hemophilia; Human Immunodeficiency Virus; Lymphedema; Sickle Cell Disease Respiratory Medical History: Positive for: Chronic Obstructive Pulmonary Disease (COPD) Negative for: Aspiration; Asthma; Pneumothorax; Sleep Apnea; Tuberculosis Immunological Medical History: Negative for: Lupus Erythematosus; Raynauds; Scleroderma Oncologic Medical History: Negative for: Received Chemotherapy; Received Radiation Immunizations Pneumococcal Vaccine: Received Pneumococcal Vaccination: No Implantable Devices None Family and Social History Cancer: Yes - Siblings; Diabetes: No; Heart Disease: No; Hereditary Spherocytosis: No; Hypertension: Yes - Child; Kidney Disease: Yes - Child; Lung Disease: Yes - Child; Seizures: No; Stroke: Yes - Child; Thyroid Problems: Yes - Child; Tuberculosis: No; Former smoker; Marital Status - Married; Alcohol Use: Rarely; Caffeine Use: Moderate; Financial Concerns: No; Food, Clothing or Shelter Needs: No; Support System Lacking: No; Transportation Concerns: No Engineer, maintenance) Signed: 10/05/2019 5:20:32 PM By: Linton Ham MD Signed: 10/05/2019 5:42:58 PM By: Carlene Coria RN Entered By: Carlene Coria on 10/05/2019 09:39:17 -------------------------------------------------------------------------------- Clio Details Patient Name: Date of Service: Katie [name], [place] 10/05/2019 Medical Record Number: [id number] Patient Account Number: [id number] Date of Birth/Sex: Treating RN: 1941-02-13 (79 y.o. Female) Kela Millin Primary Care Provider: Kirtland Bouchard Other Clinician: Referring Provider: Treating Provider/Extender: Glean Hess in Treatment: 0 Diagnosis Coding ICD-10 Codes Code Description [phone] Abrasion, right foot, subsequent encounter S90.812D Abrasion, left foot,  subsequent encounter L97.518 Non-pressure chronic ulcer of other part of right foot with other specified severity L97.528 Non-pressure chronic ulcer of other part of left foot with other specified severity Facility Procedures CPT4 Code: [id number] Description: 99213 - WOUND CARE VISIT-LEV 3 EST PT Modifier: 25 Quantity: 1 CPT4 Code: [id number] Description: 11042 - DEB SUBQ TISSUE 20 SQ CM/< ICD-10 Diagnosis Description L97.518 Non-pressure chronic ulcer of other part of right foot with other specified sever L97.528 Non-pressure chronic ulcer of other part of left foot with other specified  severi Modifier: ity ty Quantity: 1 Physician Procedures : CPT4 Code Description Modifier [id number] WC PHYS LEVEL 3 NEW  PT 25 ICD-10 Diagnosis Description S90.811D Abrasion, right foot, subsequent encounter S90.812D Abrasion, left foot, subsequent encounter L97.518 Non-pressure chronic ulcer of other part of  right foot with other specified severity L97.528 Non-pressure chronic ulcer of other part of left foot with other specified severity Quantity: 1 : [id number] 11042 - WC PHYS SUBQ TISS 20 SQ CM ICD-10 Diagnosis Description L97.518 Non-pressure chronic ulcer of other part of right foot with other specified severity L97.528 Non-pressure chronic ulcer of other part of left foot with other specified  severity Quantity: 1 Electronic Signature(s) Signed: 10/05/2019 5:20:32 PM By: Linton Ham MD Signed: 10/05/2019 5:51:32 PM By: Kela Millin Entered By: Kela Millin on 10/05/2019 11:11:09

## 2019-10-06 ENCOUNTER — Inpatient Hospital Stay (HOSPITAL_COMMUNITY)
Admission: EM | Admit: 2019-10-06 | Discharge: 2019-10-14 | DRG: 329 | Disposition: E | Payer: Medicare Other | Source: Ambulatory Visit | Attending: Internal Medicine | Admitting: Internal Medicine

## 2019-10-06 ENCOUNTER — Emergency Department (HOSPITAL_COMMUNITY): Payer: Medicare Other

## 2019-10-06 DIAGNOSIS — E782 Mixed hyperlipidemia: Secondary | ICD-10-CM | POA: Diagnosis present

## 2019-10-06 DIAGNOSIS — Z95810 Presence of automatic (implantable) cardiac defibrillator: Secondary | ICD-10-CM | POA: Diagnosis present

## 2019-10-06 DIAGNOSIS — J90 Pleural effusion, not elsewhere classified: Secondary | ICD-10-CM | POA: Diagnosis not present

## 2019-10-06 DIAGNOSIS — Z9049 Acquired absence of other specified parts of digestive tract: Secondary | ICD-10-CM | POA: Diagnosis not present

## 2019-10-06 DIAGNOSIS — I714 Abdominal aortic aneurysm, without rupture, unspecified: Secondary | ICD-10-CM | POA: Diagnosis present

## 2019-10-06 DIAGNOSIS — Z823 Family history of stroke: Secondary | ICD-10-CM

## 2019-10-06 DIAGNOSIS — K219 Gastro-esophageal reflux disease without esophagitis: Secondary | ICD-10-CM | POA: Diagnosis present

## 2019-10-06 DIAGNOSIS — Z803 Family history of malignant neoplasm of breast: Secondary | ICD-10-CM

## 2019-10-06 DIAGNOSIS — Z20822 Contact with and (suspected) exposure to covid-19: Secondary | ICD-10-CM | POA: Diagnosis not present

## 2019-10-06 DIAGNOSIS — L97518 Non-pressure chronic ulcer of other part of right foot with other specified severity: Secondary | ICD-10-CM | POA: Diagnosis present

## 2019-10-06 DIAGNOSIS — K651 Peritoneal abscess: Principal | ICD-10-CM

## 2019-10-06 DIAGNOSIS — I130 Hypertensive heart and chronic kidney disease with heart failure and stage 1 through stage 4 chronic kidney disease, or unspecified chronic kidney disease: Secondary | ICD-10-CM | POA: Diagnosis present

## 2019-10-06 DIAGNOSIS — I5042 Chronic combined systolic (congestive) and diastolic (congestive) heart failure: Secondary | ICD-10-CM | POA: Diagnosis present

## 2019-10-06 DIAGNOSIS — L97528 Non-pressure chronic ulcer of other part of left foot with other specified severity: Secondary | ICD-10-CM | POA: Diagnosis present

## 2019-10-06 DIAGNOSIS — M109 Gout, unspecified: Secondary | ICD-10-CM | POA: Diagnosis present

## 2019-10-06 DIAGNOSIS — Z66 Do not resuscitate: Secondary | ICD-10-CM | POA: Diagnosis not present

## 2019-10-06 DIAGNOSIS — K5780 Diverticulitis of intestine, part unspecified, with perforation and abscess without bleeding: Secondary | ICD-10-CM | POA: Diagnosis present

## 2019-10-06 DIAGNOSIS — I739 Peripheral vascular disease, unspecified: Secondary | ICD-10-CM | POA: Diagnosis present

## 2019-10-06 DIAGNOSIS — N170 Acute kidney failure with tubular necrosis: Secondary | ICD-10-CM | POA: Diagnosis present

## 2019-10-06 DIAGNOSIS — Z7901 Long term (current) use of anticoagulants: Secondary | ICD-10-CM

## 2019-10-06 DIAGNOSIS — E872 Acidosis: Secondary | ICD-10-CM | POA: Diagnosis not present

## 2019-10-06 DIAGNOSIS — A419 Sepsis, unspecified organism: Secondary | ICD-10-CM | POA: Diagnosis not present

## 2019-10-06 DIAGNOSIS — I4891 Unspecified atrial fibrillation: Secondary | ICD-10-CM | POA: Diagnosis present

## 2019-10-06 DIAGNOSIS — D689 Coagulation defect, unspecified: Secondary | ICD-10-CM | POA: Diagnosis not present

## 2019-10-06 DIAGNOSIS — Z885 Allergy status to narcotic agent status: Secondary | ICD-10-CM

## 2019-10-06 DIAGNOSIS — E875 Hyperkalemia: Secondary | ICD-10-CM | POA: Diagnosis not present

## 2019-10-06 DIAGNOSIS — J431 Panlobular emphysema: Secondary | ICD-10-CM | POA: Diagnosis not present

## 2019-10-06 DIAGNOSIS — J449 Chronic obstructive pulmonary disease, unspecified: Secondary | ICD-10-CM | POA: Diagnosis present

## 2019-10-06 DIAGNOSIS — N179 Acute kidney failure, unspecified: Secondary | ICD-10-CM | POA: Diagnosis not present

## 2019-10-06 DIAGNOSIS — J9621 Acute and chronic respiratory failure with hypoxia: Secondary | ICD-10-CM | POA: Diagnosis not present

## 2019-10-06 DIAGNOSIS — G9341 Metabolic encephalopathy: Secondary | ICD-10-CM | POA: Diagnosis not present

## 2019-10-06 DIAGNOSIS — I2510 Atherosclerotic heart disease of native coronary artery without angina pectoris: Secondary | ICD-10-CM | POA: Diagnosis present

## 2019-10-06 DIAGNOSIS — Z811 Family history of alcohol abuse and dependence: Secondary | ICD-10-CM

## 2019-10-06 DIAGNOSIS — I10 Essential (primary) hypertension: Secondary | ICD-10-CM | POA: Diagnosis not present

## 2019-10-06 DIAGNOSIS — J9622 Acute and chronic respiratory failure with hypercapnia: Secondary | ICD-10-CM | POA: Diagnosis not present

## 2019-10-06 DIAGNOSIS — N183 Chronic kidney disease, stage 3 unspecified: Secondary | ICD-10-CM | POA: Diagnosis present

## 2019-10-06 DIAGNOSIS — E873 Alkalosis: Secondary | ICD-10-CM | POA: Diagnosis not present

## 2019-10-06 DIAGNOSIS — I480 Paroxysmal atrial fibrillation: Secondary | ICD-10-CM | POA: Diagnosis not present

## 2019-10-06 DIAGNOSIS — R6521 Severe sepsis with septic shock: Secondary | ICD-10-CM | POA: Diagnosis not present

## 2019-10-06 DIAGNOSIS — K650 Generalized (acute) peritonitis: Secondary | ICD-10-CM | POA: Diagnosis not present

## 2019-10-06 DIAGNOSIS — N739 Female pelvic inflammatory disease, unspecified: Secondary | ICD-10-CM | POA: Diagnosis present

## 2019-10-06 DIAGNOSIS — R188 Other ascites: Secondary | ICD-10-CM | POA: Diagnosis not present

## 2019-10-06 DIAGNOSIS — N1832 Chronic kidney disease, stage 3b: Secondary | ICD-10-CM | POA: Diagnosis present

## 2019-10-06 DIAGNOSIS — J9690 Respiratory failure, unspecified, unspecified whether with hypoxia or hypercapnia: Secondary | ICD-10-CM

## 2019-10-06 DIAGNOSIS — Z6841 Body Mass Index (BMI) 40.0 and over, adult: Secondary | ICD-10-CM

## 2019-10-06 DIAGNOSIS — K6389 Other specified diseases of intestine: Secondary | ICD-10-CM | POA: Diagnosis not present

## 2019-10-06 DIAGNOSIS — Z8679 Personal history of other diseases of the circulatory system: Secondary | ICD-10-CM

## 2019-10-06 DIAGNOSIS — R918 Other nonspecific abnormal finding of lung field: Secondary | ICD-10-CM | POA: Diagnosis not present

## 2019-10-06 DIAGNOSIS — Z881 Allergy status to other antibiotic agents status: Secondary | ICD-10-CM

## 2019-10-06 DIAGNOSIS — J9620 Acute and chronic respiratory failure, unspecified whether with hypoxia or hypercapnia: Secondary | ICD-10-CM

## 2019-10-06 DIAGNOSIS — Z79890 Hormone replacement therapy (postmenopausal): Secondary | ICD-10-CM

## 2019-10-06 DIAGNOSIS — R34 Anuria and oliguria: Secondary | ICD-10-CM | POA: Diagnosis not present

## 2019-10-06 DIAGNOSIS — Z978 Presence of other specified devices: Secondary | ICD-10-CM

## 2019-10-06 DIAGNOSIS — M069 Rheumatoid arthritis, unspecified: Secondary | ICD-10-CM | POA: Diagnosis present

## 2019-10-06 DIAGNOSIS — Z818 Family history of other mental and behavioral disorders: Secondary | ICD-10-CM

## 2019-10-06 DIAGNOSIS — Z7189 Other specified counseling: Secondary | ICD-10-CM

## 2019-10-06 DIAGNOSIS — Z7951 Long term (current) use of inhaled steroids: Secondary | ICD-10-CM

## 2019-10-06 DIAGNOSIS — K5792 Diverticulitis of intestine, part unspecified, without perforation or abscess without bleeding: Secondary | ICD-10-CM | POA: Diagnosis present

## 2019-10-06 DIAGNOSIS — K5720 Diverticulitis of large intestine with perforation and abscess without bleeding: Principal | ICD-10-CM | POA: Diagnosis present

## 2019-10-06 DIAGNOSIS — Z888 Allergy status to other drugs, medicaments and biological substances status: Secondary | ICD-10-CM

## 2019-10-06 DIAGNOSIS — E871 Hypo-osmolality and hyponatremia: Secondary | ICD-10-CM | POA: Diagnosis present

## 2019-10-06 DIAGNOSIS — Z515 Encounter for palliative care: Secondary | ICD-10-CM | POA: Diagnosis not present

## 2019-10-06 DIAGNOSIS — Z4659 Encounter for fitting and adjustment of other gastrointestinal appliance and device: Secondary | ICD-10-CM

## 2019-10-06 DIAGNOSIS — I482 Chronic atrial fibrillation, unspecified: Secondary | ICD-10-CM | POA: Diagnosis present

## 2019-10-06 DIAGNOSIS — Z87891 Personal history of nicotine dependence: Secondary | ICD-10-CM

## 2019-10-06 DIAGNOSIS — F329 Major depressive disorder, single episode, unspecified: Secondary | ICD-10-CM | POA: Diagnosis present

## 2019-10-06 DIAGNOSIS — M170 Bilateral primary osteoarthritis of knee: Secondary | ICD-10-CM | POA: Diagnosis present

## 2019-10-06 DIAGNOSIS — Z9981 Dependence on supplemental oxygen: Secondary | ICD-10-CM

## 2019-10-06 DIAGNOSIS — E039 Hypothyroidism, unspecified: Secondary | ICD-10-CM | POA: Diagnosis present

## 2019-10-06 DIAGNOSIS — D6489 Other specified anemias: Secondary | ICD-10-CM | POA: Diagnosis not present

## 2019-10-06 DIAGNOSIS — Z7982 Long term (current) use of aspirin: Secondary | ICD-10-CM

## 2019-10-06 DIAGNOSIS — I7300 Raynaud's syndrome without gangrene: Secondary | ICD-10-CM | POA: Diagnosis present

## 2019-10-06 DIAGNOSIS — Z4682 Encounter for fitting and adjustment of non-vascular catheter: Secondary | ICD-10-CM | POA: Diagnosis not present

## 2019-10-06 DIAGNOSIS — Z8249 Family history of ischemic heart disease and other diseases of the circulatory system: Secondary | ICD-10-CM

## 2019-10-06 DIAGNOSIS — I517 Cardiomegaly: Secondary | ICD-10-CM | POA: Diagnosis not present

## 2019-10-06 DIAGNOSIS — M8580 Other specified disorders of bone density and structure, unspecified site: Secondary | ICD-10-CM | POA: Diagnosis present

## 2019-10-06 DIAGNOSIS — Z79899 Other long term (current) drug therapy: Secondary | ICD-10-CM

## 2019-10-06 DIAGNOSIS — G629 Polyneuropathy, unspecified: Secondary | ICD-10-CM | POA: Diagnosis present

## 2019-10-06 DIAGNOSIS — I2721 Secondary pulmonary arterial hypertension: Secondary | ICD-10-CM | POA: Diagnosis present

## 2019-10-06 DIAGNOSIS — E8809 Other disorders of plasma-protein metabolism, not elsewhere classified: Secondary | ICD-10-CM | POA: Diagnosis not present

## 2019-10-06 LAB — URINALYSIS, ROUTINE W REFLEX MICROSCOPIC
Bilirubin Urine: NEGATIVE
Glucose, UA: NEGATIVE mg/dL
Ketones, ur: NEGATIVE mg/dL
Nitrite: NEGATIVE
Protein, ur: NEGATIVE mg/dL
Specific Gravity, Urine: 1.017 (ref 1.005–1.030)
pH: 5 (ref 5.0–8.0)

## 2019-10-06 LAB — CBC WITH DIFFERENTIAL/PLATELET
Absolute Monocytes: 433 cells/uL (ref 200–950)
Basophils Absolute: 46 cells/uL (ref 0–200)
Basophils Relative: 0.4 %
Eosinophils Absolute: 68 cells/uL (ref 15–500)
Eosinophils Relative: 0.6 %
HCT: 28.8 % — ABNORMAL LOW (ref 35.0–45.0)
Hemoglobin: 9 g/dL — ABNORMAL LOW (ref 11.7–15.5)
Lymphs Abs: 912 cells/uL (ref 850–3900)
MCH: 27.3 pg (ref 27.0–33.0)
MCHC: 31.3 g/dL — ABNORMAL LOW (ref 32.0–36.0)
MCV: 87.3 fL (ref 80.0–100.0)
MPV: 10 fL (ref 7.5–12.5)
Monocytes Relative: 3.8 %
Neutro Abs: 9941 cells/uL — ABNORMAL HIGH (ref 1500–7800)
Neutrophils Relative %: 87.2 %
Platelets: 293 10*3/uL (ref 140–400)
RBC: 3.3 10*6/uL — ABNORMAL LOW (ref 3.80–5.10)
RDW: 17 % — ABNORMAL HIGH (ref 11.0–15.0)
Total Lymphocyte: 8 %
WBC: 11.4 10*3/uL — ABNORMAL HIGH (ref 3.8–10.8)

## 2019-10-06 LAB — COMPLETE METABOLIC PANEL WITH GFR
AG Ratio: 1.2 (calc) (ref 1.0–2.5)
ALT: 14 U/L (ref 6–29)
AST: 14 U/L (ref 10–35)
Albumin: 3.1 g/dL — ABNORMAL LOW (ref 3.6–5.1)
Alkaline phosphatase (APISO): 70 U/L (ref 37–153)
BUN/Creatinine Ratio: 16 (calc) (ref 6–22)
BUN: 45 mg/dL — ABNORMAL HIGH (ref 7–25)
CO2: 20 mmol/L (ref 20–32)
Calcium: 8.8 mg/dL (ref 8.6–10.4)
Chloride: 96 mmol/L — ABNORMAL LOW (ref 98–110)
Creat: 2.83 mg/dL — ABNORMAL HIGH (ref 0.60–0.93)
GFR, Est African American: 18 mL/min/{1.73_m2} — ABNORMAL LOW (ref >=60)
GFR, Est Non African American: 15 mL/min/{1.73_m2} — ABNORMAL LOW (ref >=60)
Globulin: 2.5 g/dL (calc) (ref 1.9–3.7)
Glucose, Bld: 76 mg/dL (ref 65–99)
Potassium: 4.6 mmol/L (ref 3.5–5.3)
Sodium: 132 mmol/L — ABNORMAL LOW (ref 135–146)
Total Bilirubin: 0.7 mg/dL (ref 0.2–1.2)
Total Protein: 5.6 g/dL — ABNORMAL LOW (ref 6.1–8.1)

## 2019-10-06 LAB — BASIC METABOLIC PANEL
Anion gap: 17 — ABNORMAL HIGH (ref 5–15)
BUN: 47 mg/dL — ABNORMAL HIGH (ref 8–23)
CO2: 17 mmol/L — ABNORMAL LOW (ref 22–32)
Calcium: 8.3 mg/dL — ABNORMAL LOW (ref 8.9–10.3)
Chloride: 96 mmol/L — ABNORMAL LOW (ref 98–111)
Creatinine, Ser: 2.96 mg/dL — ABNORMAL HIGH (ref 0.44–1.00)
GFR calc Af Amer: 17 mL/min — ABNORMAL LOW (ref >60)
GFR calc non Af Amer: 15 mL/min — ABNORMAL LOW (ref >60)
Glucose, Bld: 88 mg/dL (ref 70–99)
Potassium: 4.9 mmol/L (ref 3.5–5.1)
Sodium: 130 mmol/L — ABNORMAL LOW (ref 135–145)

## 2019-10-06 LAB — CBC
HCT: 27.7 % — ABNORMAL LOW (ref 36.0–46.0)
Hemoglobin: 8.5 g/dL — ABNORMAL LOW (ref 12.0–15.0)
MCH: 27.2 pg (ref 26.0–34.0)
MCHC: 30.7 g/dL (ref 30.0–36.0)
MCV: 88.5 fL (ref 80.0–100.0)
Platelets: 328 10*3/uL (ref 150–400)
RBC: 3.13 MIL/uL — ABNORMAL LOW (ref 3.87–5.11)
RDW: 19.1 % — ABNORMAL HIGH (ref 11.5–15.5)
WBC: 13.3 10*3/uL — ABNORMAL HIGH (ref 4.0–10.5)
nRBC: 0.8 % — ABNORMAL HIGH (ref 0.0–0.2)

## 2019-10-06 LAB — SARS CORONAVIRUS 2 BY RT PCR (HOSPITAL ORDER, PERFORMED IN ~~LOC~~ HOSPITAL LAB): SARS Coronavirus 2: NEGATIVE

## 2019-10-06 LAB — LACTIC ACID, PLASMA
Lactic Acid, Venous: 1.8 mmol/L (ref 0.5–1.9)
Lactic Acid, Venous: 1.9 mmol/L (ref 0.5–1.9)

## 2019-10-06 LAB — HEPATIC FUNCTION PANEL
ALT: 18 U/L (ref 0–44)
AST: 18 U/L (ref 15–41)
Albumin: 2.1 g/dL — ABNORMAL LOW (ref 3.5–5.0)
Alkaline Phosphatase: 69 U/L (ref 38–126)
Bilirubin, Direct: 0.6 mg/dL — ABNORMAL HIGH (ref 0.0–0.2)
Indirect Bilirubin: 0.2 mg/dL — ABNORMAL LOW (ref 0.3–0.9)
Total Bilirubin: 0.8 mg/dL (ref 0.3–1.2)
Total Protein: 5.5 g/dL — ABNORMAL LOW (ref 6.5–8.1)

## 2019-10-06 LAB — OSMOLALITY: Osmolality: 290 mOsm/kg (ref 275–295)

## 2019-10-06 LAB — PHOSPHORUS: Phosphorus: 6.2 mg/dL — ABNORMAL HIGH (ref 2.5–4.6)

## 2019-10-06 LAB — OSMOLALITY, URINE: Osmolality, Ur: 342 mOsm/kg (ref 300–900)

## 2019-10-06 LAB — MAGNESIUM: Magnesium: 2.4 mg/dL (ref 1.7–2.4)

## 2019-10-06 MED ORDER — PIPERACILLIN-TAZOBACTAM 3.375 G IVPB 30 MIN
3.3750 g | Freq: Once | INTRAVENOUS | Status: DC
  Filled 2019-10-06 (×2): qty 50

## 2019-10-06 MED ORDER — PANTOPRAZOLE SODIUM 40 MG IV SOLR
40.0000 mg | INTRAVENOUS | Status: DC
  Administered 2019-10-06 – 2019-10-07 (×2): 40 mg via INTRAVENOUS
  Filled 2019-10-06 (×2): qty 40

## 2019-10-06 MED ORDER — SODIUM CHLORIDE 0.9 % IV BOLUS
1000.0000 mL | Freq: Once | INTRAVENOUS | Status: AC
  Administered 2019-10-06: 1000 mL via INTRAVENOUS

## 2019-10-06 MED ORDER — ACETAMINOPHEN 500 MG PO TABS
1000.0000 mg | ORAL_TABLET | Freq: Three times a day (TID) | ORAL | Status: DC | PRN
  Administered 2019-10-06 – 2019-10-08 (×6): 1000 mg via ORAL
  Filled 2019-10-06 (×6): qty 2

## 2019-10-06 MED ORDER — LEVOTHYROXINE SODIUM 25 MCG PO TABS
125.0000 ug | ORAL_TABLET | Freq: Every day | ORAL | Status: DC
  Administered 2019-10-07 – 2019-10-09 (×3): 125 ug via ORAL
  Filled 2019-10-06 (×3): qty 1

## 2019-10-06 MED ORDER — IPRATROPIUM BROMIDE 0.06 % NA SOLN
1.0000 | Freq: Three times a day (TID) | NASAL | Status: DC | PRN
  Filled 2019-10-06: qty 15

## 2019-10-06 MED ORDER — PIPERACILLIN-TAZOBACTAM 3.375 G IVPB 30 MIN
3.3750 g | Freq: Three times a day (TID) | INTRAVENOUS | Status: DC
  Administered 2019-10-06 – 2019-10-07 (×2): 3.375 g via INTRAVENOUS
  Filled 2019-10-06 (×4): qty 50

## 2019-10-06 MED ORDER — PRAVASTATIN SODIUM 10 MG PO TABS
10.0000 mg | ORAL_TABLET | ORAL | Status: DC
  Administered 2019-10-06 – 2019-10-08 (×2): 10 mg via ORAL
  Filled 2019-10-06 (×2): qty 1

## 2019-10-06 MED ORDER — MONTELUKAST SODIUM 10 MG PO TABS
10.0000 mg | ORAL_TABLET | Freq: Every day | ORAL | Status: DC
  Administered 2019-10-07 – 2019-10-08 (×2): 10 mg via ORAL
  Filled 2019-10-06 (×2): qty 1

## 2019-10-06 MED ORDER — ALBUTEROL SULFATE (2.5 MG/3ML) 0.083% IN NEBU: 2.5000 mg | INHALATION_SOLUTION | Freq: Four times a day (QID) | RESPIRATORY_TRACT | Status: DC | PRN

## 2019-10-06 MED ORDER — FLUTICASONE FUROATE-VILANTEROL 100-25 MCG/INH IN AEPB
1.0000 | INHALATION_SPRAY | Freq: Every day | RESPIRATORY_TRACT | Status: DC
  Filled 2019-10-06: qty 28

## 2019-10-06 MED ORDER — ALBUTEROL SULFATE HFA 108 (90 BASE) MCG/ACT IN AERS: 2.0000 | INHALATION_SPRAY | RESPIRATORY_TRACT | Status: DC

## 2019-10-06 NOTE — ED Triage Notes (Signed)
Pt sent by PCP for abnormal kidney function reflected on blood work done yesterday. (creatinine 2.83 & GFR 15 yesterday, 1.68 & 29 three days prior). Endorses R flank pain.

## 2019-10-06 NOTE — Progress Notes (Signed)
Patient referred to ER for worsening Kidney Function

## 2019-10-06 NOTE — Consult Note (Signed)
Reason for Consult: Perforated diverticulitis with intra-abdominal abscess Referring Physician: P Messick  Katie [name] is an 79 y.o. female.  HPI: 79 year old female with a history of multiple medical problems including atrial fibrillation, CHF, AICD in place, AAA and PVD was admitted a week ago to [facility] with a lower GI bleed.  Colonoscopy was scheduled in 1 month as an outpatient.  She developed back pain and some diarrhea.  She was referred for evaluation in the emergency department by her primary care physician.  CT scan of the abdomen and pelvis reveals perforated sigmoid diverticulitis with air into the mesentery, some moderate free air, and 2 intra-abdominal abscesses.  I was asked to see her in consultation regarding surgical care.  She takes Xarelto and she last took it the evening of 7/22.  Past Medical History:  Diagnosis Date   AAA (abdominal aortic aneurysm) ([place])    AICD (automatic cardioverter/defibrillator) present 11/10/2017   Arthritis    "some in my knees" (11/10/2017)   Atrial fibrillation (H[facility])    CHF (congestive heart failure) (H[facility])    Chronic bronchitis (HCC)    COPD (chronic obstructive pulmonary disease) (H[facility])    Depression    GERD (gastroesophageal reflux disease)    Gout    "daily RX" (11/10/2017)   History of hiatal hernia    Hyperlipidemia    Hypertension    Hypothyroid    Migraine headache    "hx; none since 1980s" (11/10/2017)   Osteopenia    Pneumonia    "~ 3 times" (11/10/2017)   PVD (peripheral vascular disease) ([place])     Past Surgical History:  Procedure Laterality Date   ABDOMINAL AORTIC ENDOVASCULAR STENT GRAFT N/A 04/11/2013   Procedure: ABDOMINAL AORTIC ENDOVASCULAR STENT GRAFT WITH RIGHT FEMORAL PATCH ANGIOPLASTY;  Surgeon: Mal Misty, MD;  Location: [facility];  Service: Vascular;  Laterality: N/A;   AV NODE ABLATION N/A 09/13/2018   Procedure: AV NODE ABLATION;  Surgeon: Evans Lance, MD;  Location: [place] [facility];   Service: Cardiovascular;  Laterality: N/A;   BIV ICD INSERTION CRT-D  11/10/2017   BIV ICD INSERTION CRT-D N/A 11/10/2017   Procedure: BIV ICD INSERTION CRT-D;  Surgeon: Evans Lance, MD;  Location: [place] [facility];  Service: Cardiovascular;  Laterality: N/A;   BREAST SURGERY     LEFT BREAST BIOPSY   CARDIAC CATHETERIZATION     CATARACT EXTRACTION W/ INTRAOCULAR LENS  IMPLANT, BILATERAL Bilateral    CHOLECYSTECTOMY OPEN  1972   COLONOSCOPY     ESOPHAGEAL BRUSHING  09/29/2019   Procedure: ESOPHAGEAL BRUSHING;  Surgeon: Rogene Houston, MD;  [facility];  Service: Endoscopy;;  for candida    ESOPHAGOGASTRODUODENOSCOPY N/A 09/29/2019   Procedure: ESOPHAGOGASTRODUODENOSCOPY (EGD);  Surgeon: Rogene Houston, MD;  [facility];  Service: Endoscopy;  Laterality: N/A;   EYE SURGERY Bilateral    "bleeding in my eyes"   FRACTURE SURGERY     TIBIA FRACTURE SURGERY Left 1970s   TONSILLECTOMY     VARICOSE VEIN SURGERY Bilateral    "laser"    Family History  Problem Relation Age of Onset   Cirrhosis Mother    Cancer Mother 72       PANCREAS   Heart defect Sister    Breast cancer Sister        age 52   Heart disease Sister    Stroke Sister    Alcohol abuse Father    Depression Father  Hypertension Brother    Hyperlipidemia Son    Heart disease Daughter     Social History:  reports that she quit smoking about 17 years ago. Her smoking use included cigarettes. She has a 108.00 pack-year smoking history. She has never used smokeless tobacco. She reports that she does not drink alcohol and does not use drugs.  Allergies:  Allergies  Allergen Reactions   Amiodarone Other (See Comments)    PULMONARY TOXICITY   Diovan [Valsartan] Other (See Comments)    HYPOTENSION   Doxycycline Diarrhea and Other (See Comments)    VISUAL DISTURBANCE   Flexeril [Cyclobenzaprine] Other (See Comments)    FATIGUE   Keflex [Cephalexin] Diarrhea    Verapamil Other (See Comments)    EDEMA   Codeine Hives    Medications: I have reviewed the patient's current medications.  Results for orders placed or performed during the hospital encounter of 09/25/2019 (from the past 48 hour(s))  Basic metabolic panel     Status: Abnormal   Collection Time: 10/07/2019 10:42 AM  Result Value Ref Range   Sodium 130 (L) 135 - 145 mmol/L   Potassium 4.9 3.5 - 5.1 mmol/L   Chloride 96 (L) 98 - 111 mmol/L   CO2 17 (L) 22 - 32 mmol/L   Glucose, Bld 88 70 - 99 mg/dL    Comment: Glucose reference range applies only to samples taken after fasting for at least 8 hours.   BUN 47 (H) 8 - 23 mg/dL   Creatinine, Ser 2.96 (H) 0.44 - 1.00 mg/dL   Calcium 8.3 (L) 8.9 - 10.3 mg/dL   GFR calc non Af Amer 15 (L) >60 mL/min   GFR calc Af Amer 17 (L) >60 mL/min   Anion gap 17 (H) 5 - 15    Comment: Performed at [facility] [address], [place], [place] [address]  CBC     Status: Abnormal   Collection Time: 10/07/2019 10:42 AM  Result Value Ref Range   WBC 13.3 (H) 4.0 - 10.5 K/uL   RBC 3.13 (L) 3.87 - 5.11 MIL/uL   Hemoglobin 8.5 (L) 12.0 - 15.0 g/dL   HCT 27.7 (L) 36 - 46 %   MCV 88.5 80.0 - 100.0 fL   MCH 27.2 26.0 - 34.0 pg   MCHC 30.7 30.0 - 36.0 g/dL   RDW 19.1 (H) 11.5 - 15.5 %   Platelets 328 150 - 400 K/uL   nRBC 0.8 (H) 0.0 - 0.2 %    Comment: Performed at [facility] [address], [place], [place] [address]  Hepatic function panel     Status: Abnormal   Collection Time: 10/12/2019 10:42 AM  Result Value Ref Range   Total Protein 5.5 (L) 6.5 - 8.1 g/dL   Albumin 2.1 (L) 3.5 - 5.0 g/dL   AST 18 15 - 41 U/L   ALT 18 0 - 44 U/L   Alkaline Phosphatase 69 38 - 126 U/L   Total Bilirubin 0.8 0.3 - 1.2 mg/dL   Bilirubin, Direct 0.6 (H) 0.0 - 0.2 mg/dL   Indirect Bilirubin 0.2 (L) 0.3 - 0.9 mg/dL    Comment: Performed at [facility] [address], [place], [place] [address]  Urinalysis, Routine w reflex microscopic- may I&O  cath if menses     Status: Abnormal   Collection Time: 09/15/2019  5:37 PM  Result Value Ref Range   Color, Urine YELLOW YELLOW   APPearance HAZY (A) CLEAR  Specific Gravity, Urine 1.017 1.005 - 1.030   pH 5.0 5.0 - 8.0   Glucose, UA NEGATIVE NEGATIVE mg/dL   Hgb urine dipstick LARGE (A) NEGATIVE   Bilirubin Urine NEGATIVE NEGATIVE   Ketones, ur NEGATIVE NEGATIVE mg/dL   Protein, ur NEGATIVE NEGATIVE mg/dL   Nitrite NEGATIVE NEGATIVE   Leukocytes,Ua MODERATE (A) NEGATIVE   RBC / HPF 21-50 0 - 5 RBC/hpf   WBC, UA 21-50 0 - 5 WBC/hpf   Bacteria, UA FEW (A) NONE SEEN   Squamous Epithelial / LPF 6-10 0 - 5   Mucus PRESENT    Hyaline Casts, UA PRESENT    Non Squamous Epithelial 0-5 (A) NONE SEEN    Comment: Performed at [facility] [address] [place], [place] [address]   *Note: Due to a large number of results and/or encounters for the requested time period, some results have not been displayed. A complete set of results can be found in Results Review.    DG Chest Portable 1 View  Result Date: 09/18/2019 CLINICAL DATA:  79 year old female with history of shortness of breath. EXAM: PORTABLE CHEST 1 VIEW COMPARISON:  Chest x-ray 08/12/2019. FINDINGS: Lung volumes are normal. No acute consolidative airspace disease. No pleural effusions. Scattered areas of interstitial prominence throughout the lungs bilaterally along with widespread peribronchial cuffing, similar to prior examinations. No pneumothorax. Heart size is moderately enlarged. Upper mediastinal contours are within normal limits. Aortic atherosclerosis. Left-sided biventricular pacemaker/AICD with lead tips projecting over the expected location of the right atrium, right ventricle and overlying the left ventricle via the coronary sinus and coronary veins. IMPRESSION: 1. No radiographic evidence of acute cardiopulmonary disease. 2. Chronic lung changes suggestive of pulmonary fibrosis redemonstrated, as above. 3. Moderate  cardiomegaly. 4. Aortic atherosclerosis. Electronically Signed   By: Vinnie Langton M.D.   On: 09/19/2019 18:19   CT Renal Stone Study  Addendum Date: 10/05/2019   ADDENDUM REPORT: 10/09/2019 18:42 ADDENDUM: These results were called by telephone at the time of interpretation on 09/20/2019 at 6:41 pm to provider [facility] , who verbally acknowledged these results. Electronically Signed   By: Fidela Salisbury MD   On: 09/16/2019 18:42   Result Date: 10/03/2019 CLINICAL DATA:  Flank pain, acute renal failure, right flank pain EXAM: CT ABDOMEN AND PELVIS WITHOUT CONTRAST TECHNIQUE: Multidetector CT imaging of the abdomen and pelvis was performed following the standard protocol without IV contrast. COMPARISON:  09/28/2019 FINDINGS: Lower chest: Bibasilar ground-glass pulmonary infiltrates and peripheral reticulation appears slightly progressive at the left lung base since prior examination may reflect the sequela of subacute atypical infection. Mild global cardiomegaly is present with particular right atrial enlargement. Extensive coronary artery calcifications present. Pacemaker leads are seen within the right ventricle and left ventricular venous outflow. Hepatobiliary: Status post cholecystectomy. Liver is unremarkable. No intra or extrahepatic biliary ductal dilation. Pancreas: Unremarkable Spleen: Unremarkable Adrenals/Urinary Tract: The adrenal glands and kidneys are unremarkable. The bladder is largely decompressed. Stomach/Bowel: Since the prior examination, there has developed moderate free intraperitoneal gas as well as gas dissecting within the mesentery. Additionally, there have developed two distinct pericolonic gas and fluid collections within the left hemipelvis measuring 8.4 x 5.2 by 4.9 cm at axial image # 64/3 and 4.9 x 3.7 x 2.5 cm on axial image # 73/3 and sagittal image # 60/7. The findings are most in keeping with perforated sigmoid diverticulitis with development of at least to  pericolonic abscesses. No evidence of obstruction. Stomach, small bowel, are  unremarkable. Mild to moderate sigmoid diverticulosis is again identified. 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 Extensive atherosclerotic calcification within the supra celiac and juxtarenal aorta. Infrarenal abdominal aortic endograft is in place extending into the common iliac arteries bilaterally. This is better assessed on prior CT a of 09/28/2019. Extensive atherosclerotic calcification is seen within the lower extremity arterial inflow bilaterally. No pathologic adenopathy within the abdomen and pelvis. Reproductive: Unremarkable Other: There has developed extensive subcutaneous edema within the dependent body wall possibly related to developing anasarca. Musculoskeletal: No acute bone abnormality. Degenerative changes are seen within the lumbar spine. IMPRESSION: Perforated sigmoid diverticulitis with moderate free intraperitoneal gas and development of to pericolonic abscesses within the pelvis. No evidence of obstruction. Progressive dependent body wall edema may reflect changes of progressive anasarca. Progressive bibasilar pulmonary infiltrates possibly reflecting changes of subacute infection. Aortic Atherosclerosis (ICD10-I70.0). Electronically Signed: By: Fidela Salisbury MD On: 09/15/2019 18:36    Review of Systems  Constitutional: Positive for appetite change.  HENT: Negative.   Eyes: Negative.   Respiratory: Negative for shortness of breath.   Cardiovascular: Negative for chest pain.  Gastrointestinal: Positive for abdominal pain, diarrhea and nausea.  Endocrine: Negative.   Genitourinary: Negative.   Musculoskeletal: Positive for back pain.  Skin: Negative.   Allergic/Immunologic: Negative.   Neurological: Negative.   Hematological: Bruises/bleeds easily.  Psychiatric/Behavioral: Negative.    Blood pressure  (!) 137/57, pulse 71, temperature (!) 97.5 F (36.4 C), temperature source Oral, resp. rate 17, height 5\' 3"  (1.6 m), weight 80.7 kg, SpO2 98 %. Physical Exam Constitutional:      Appearance: Normal appearance. She is not ill-appearing or diaphoretic.  HENT:     Head: Normocephalic.     Right Ear: External ear normal.     Left Ear: External ear normal.     Nose: Nose normal.     Mouth/Throat:     Mouth: Mucous membranes are moist.  Eyes:     General: No scleral icterus.    Extraocular Movements: Extraocular movements intact.     Pupils: Pupils are equal, round, and reactive to light.  Cardiovascular:     Rate and Rhythm: Normal rate. Rhythm irregular.     Pulses: Normal pulses.     Heart sounds: No murmur heard.   Pulmonary:     Effort: Pulmonary effort is normal.     Breath sounds: Normal breath sounds. No stridor. No wheezing or rhonchi.  Abdominal:     General: Abdomen is flat.     Tenderness: There is abdominal tenderness. There is no guarding or rebound.     Comments: Old midline scar, tender suprapubic and left lower quadrant but no guarding or peritonitis, hypoactive bowel sounds  Musculoskeletal:     Cervical back: Normal range of motion and neck supple. No rigidity or tenderness.     Comments: Moderate peripheral edema  Skin:    General: Skin is warm and dry.     Capillary Refill: Capillary refill takes 2 to 3 seconds.  Neurological:     Mental Status: She is alert and oriented to person, place, and time.  Psychiatric:        Mood and Affect: Mood normal.     Assessment/Plan: Perforated sigmoid diverticulitis with intra-abdominal abscess x2 -agree with Zosyn IV, NPO except sips with meds and ice chips, interventional radiology for image guided percutaneous drain. Hold Xarelto.  If she does not improve, I advised  her she may need partial colectomy with colostomy.  I also discussed her care with the admitting team.  We will follow closely.  Zenovia Jarred 09/14/2019, 7:41 PM

## 2019-10-06 NOTE — ED Provider Notes (Signed)
[facility] EMERGENCY DEPARTMENT Provider Note   CSN: [id number] Arrival date & time: 09/26/2019  1020     History Chief Complaint  Patient presents with  . Abnormal Lab    Katie [name] is a 79 y.o. female.  HPI Patient is a 79 year old female with a lengthy medical history as noted below anticoagulated on Xarelto.  She was recently admitted for an acute GI bleed.  Patient was found to have diverticulitis and was started on Augmentin.  This was stopped 2 days ago due to significant watery brown diarrhea.  Patient was started on ciprofloxacin/Flagyl.  She states she only started ciprofloxacin yesterday.  Patient now complains of 5 to 6 days of bilateral flank pain, right greater than left, diffuse lower abdominal pain, tingling with urination, worsening shortness of breath at night.  Patient states she is on 1 L of oxygen at night via nasal cannula chronically.  Patient reports moderate edema in the bilateral lower extremities as well as 2 chronic wounds to the bilateral feet.  She states edema in her legs has been present since her previous discharge.  No fevers, chills, nausea, vomiting, constipation, hematochezia, difficulty urinating.     Past Medical History:  Diagnosis Date  . AAA (abdominal aortic aneurysm) ([place])   . AICD (automatic cardioverter/defibrillator) present 11/10/2017  . Arthritis    "some in my knees" (11/10/2017)  . Atrial fibrillation ([place])   . CHF (congestive heart failure) ([place])   . Chronic bronchitis ([place])   . COPD (chronic obstructive pulmonary disease) ([place])   . Depression   . GERD (gastroesophageal reflux disease)   . Gout    "daily RX" (11/10/2017)  . History of hiatal hernia   . Hyperlipidemia   . Hypertension   . Hypothyroid   . Migraine headache    "hx; none since 1980s" (11/10/2017)  . Osteopenia   . Pneumonia    "~ 3 times" (11/10/2017)  . PVD (peripheral vascular disease) [facility])     Patient Active Problem List   Diagnosis Date  Noted  . AKI (acute kidney injury) ([place]) 10/01/2019  . Acute metabolic encephalopathy 44/05/4740  . Diverticulitis 09/29/2019  . Symptomatic anemia 09/29/2019  . Acute lower UTI 09/29/2019  . Open wound of right foot 09/29/2019  . Leukocytosis 09/29/2019  . Dehydration 09/29/2019  . Acute GI bleeding 09/28/2019  . Acute encephalopathy 08/12/2019  . Senile purpura ([place]) 05/30/2019  . Rheumatoid arthritis involving multiple sites ([place]) 05/30/2019  . Obesity (BMI 30.0-34.9) 05/30/2019  . Mononeuropathy 11/23/2018  . 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 COPD (chronic obstructive pulmonary disease) with chronic bronchitis ([place]) 02/02/2018  . Aortic atherosclerosis ([place]) 10/17/2017  . PAH (pulmonary artery hypertension) ([place]) 06/23/2017  . Gout 03/20/2017  . Abnormal glucose 09/19/2015  . AAA (abdominal aortic aneurysm) without rupture ([place]) 04/22/2015  . PVD (peripheral vascular disease) with claudication ([place]) 10/16/2013  . CKD (chronic kidney disease) stage 3, GFR 30-59 ml/min 06/08/2013  . Hyperlipidemia, mixed 06/08/2013  . 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 Long term current use of anticoagulant therapy 04/23/2013  . Vitamin D deficiency 02/15/2013  . Hypothyroidism   . Osteopenia   . Chronic combined systolic and diastolic CHF (congestive heart failure) ([place]) 11/25/2008  . Essential hypertension 11/22/2008  . Atherosclerosis of native coronary artery of native heart without angina pectoris 11/22/2008  . Atrial fibrillation ([place]) 11/22/2008  . COPD (chronic obstructive pulmonary disease) ([place]) 11/22/2008  . Gastroesophageal reflux disease without esophagitis  11/22/2008    Past Surgical History:  Procedure Laterality Date  . ABDOMINAL AORTIC ENDOVASCULAR STENT GRAFT N/A 04/11/2013   Procedure: ABDOMINAL AORTIC ENDOVASCULAR STENT GRAFT WITH RIGHT FEMORAL PATCH ANGIOPLASTY;  Surgeon: Mal Misty, MD;  Location:  [facility];  Service: Vascular;  Laterality: N/A;  . AV NODE ABLATION N/A 09/13/2018   Procedure: AV NODE ABLATION;  Surgeon: Evans Lance, MD;  Location: [facility] CV [facility];  Service: Cardiovascular;  Laterality: N/A;  . BIV ICD INSERTION CRT-D  11/10/2017  . BIV ICD INSERTION CRT-D N/A 11/10/2017   Procedure: BIV ICD INSERTION CRT-D;  Surgeon: Evans Lance, MD;  Location: [facility];  Service: Cardiovascular;  Laterality: N/A;  . BREAST SURGERY     LEFT BREAST BIOPSY  . CARDIAC CATHETERIZATION    . CATARACT EXTRACTION W/ INTRAOCULAR LENS  IMPLANT, BILATERAL Bilateral   . CHOLECYSTECTOMY OPEN  1972  . COLONOSCOPY    . ESOPHAGEAL BRUSHING  09/29/2019   Procedure: ESOPHAGEAL BRUSHING;  Surgeon: Rogene Houston, MD;  [facility];  Service: Endoscopy;;  for candida   . ESOPHAGOGASTRODUODENOSCOPY N/A 09/29/2019   Procedure: ESOPHAGOGASTRODUODENOSCOPY (EGD);  Surgeon: Rogene Houston, MD;  [facility];  Service: Endoscopy;  Laterality: N/A;  . EYE SURGERY Bilateral    "bleeding in my eyes"  . FRACTURE SURGERY    . TIBIA FRACTURE SURGERY Left 1970s  . TONSILLECTOMY    . VARICOSE VEIN SURGERY Bilateral    "laser"     OB History    Gravida  4   Para  3   Term  3   Preterm      AB  1   Living  3     SAB      TAB      Ectopic  0   Multiple      Live Births              Family History  Problem Relation Age of Onset  . Cirrhosis Mother   . Cancer Mother 78       PANCREAS  . Heart defect Sister   . Breast cancer Sister        age 39  . Heart disease Sister   . Stroke Sister   . Alcohol abuse Father   . Depression Father   . Hypertension Brother   . Hyperlipidemia Son   . Heart disease Daughter     Social History   Tobacco Use  . Smoking status: Former Smoker    Packs/day: 2.00    Years: 54.00    Pack years: 108.00    Types: Cigarettes    Quit date: 08/10/2002    Years since quitting: 17.1  . Smokeless tobacco: Never  Used  Vaping Use  . Vaping Use: Never used  Substance Use Topics  . Alcohol use: Never  . Drug use: Never    Home Medications Prior to Admission medications   Medication Sig Start Date End Date Taking? Authorizing Provider  acetaminophen (TYLENOL) 500 MG tablet Take 1,000 mg by mouth every 6 (six) hours as needed for mild pain or moderate pain.    [provider]  albuterol (PROAIR HFA) 108 (90 Base) MCG/ACT inhaler Use 2 Inhalations 15 minutes Apart every 4 hours to Rescue Asthma Attack Patient taking differently: Inhale 2 puffs into the lungs See admin instructions. Use 2 inhalations 15 minutes apart every 4 hours as needed for asthma attack 07/29/18  [name], MD  allopurinol (ZYLOPRIM) 300 MG tablet Take 0.5 tablets (150 mg total) by mouth daily. 09/20/19   Vicie Mutters, PA-C  amoxicillin-clavulanate (AUGMENTIN) 875-125 MG tablet Take 1 tablet by mouth 2 (two) times daily for 5 days. 10/02/19 10/07/19  Roxan Hockey, MD  aspirin EC 81 MG EC tablet Take 1 tablet (81 mg total) by mouth daily with breakfast. Swallow whole. 10/03/19   Roxan Hockey, MD  budesonide-formoterol (SYMBICORT) 80-4.5 MCG/ACT inhaler Use 2 inhalations 30 minutes apart 2 x /day (every 12 hours) Patient taking differently: Inhale 2 puffs into the lungs daily.  08/28/19   [name], MD  cetirizine (ZYRTEC) 10 MG tablet Take 10 mg by mouth daily as needed for allergies.     [provider]  ciprofloxacin (CIPRO) 500 MG tablet Take 1 tablet 2 x /day with Meals for Infection 10/04/19   [name], MD  Eyelid Cleansers (AVENOVA) 0.01 % SOLN Apply 1 application topically in the morning and at bedtime. Wash eyelids twice daily with cleaner 11/28/17   [provider]  famotidine (PEPCID) 40 MG tablet Take 1 tablet Daily to Prevent Heartburn & Indigestion 10/03/19   [name], MD  ipratropium (ATROVENT) 0.06 % nasal spray Use 1 to 2 sprays each Nostril 2 to 3 x / day as  needed Patient taking differently: Place 1-2 sprays into both nostrils 3 (three) times daily as needed for rhinitis (congestion).  02/27/19 02/28/20  [name], MD  ipratropium-albuterol (DUONEB) 0.5-2.5 (3) MG/3ML SOLN Inhale 3 mLs into the lungs every 6 (six) hours as needed (for shortness of breath or wheezing). 02/27/19   [name], MD  L-Lysine 500 MG TABS Take 500 mg by mouth daily as needed.     [provider]  leflunomide (ARAVA) 20 MG tablet Take 10 mg by mouth daily.  06/18/19   [provider]  levothyroxine (SYNTHROID) 125 MCG tablet Take 1 tablet (125 mcg total) by mouth daily before breakfast. 08/18/19   Shelly Coss, MD  metroNIDAZOLE (FLAGYL) 500 MG tablet Take 1 tablet 3 x /day with meals for Infection 10/04/19   [name], MD  montelukast (SINGULAIR) 10 MG tablet TAKE 1 TABLET DAILY FOR ALLERGIES & ASTHMA Patient taking differently: Take 10 mg by mouth daily. for Allergies & Asthma 02/27/19   [name], MD  OXYGEN Inhale 2 L into the lungs continuous.    [provider]  Polyethyl Glycol-Propyl Glycol (SYSTANE) 0.4-0.3 % SOLN Place 1 drop into both eyes 4 (four) times daily as needed (for dry eyes).     [provider]  potassium chloride SA (KLOR-CON) 20 MEQ tablet Take 20 mEq by mouth daily.     [provider]  pravastatin (PRAVACHOL) 20 MG tablet Take 1 tablet at Bedtime for Cholesterol Patient taking differently: Take 10 mg by mouth every other day.  09/21/19   [name], MD  pregabalin (LYRICA) 25 MG capsule Take 1 capsule 2 to 3 x /day as needed for Neuropathy pain Patient taking differently: Take 25 mg by mouth 3 (three) times daily. Take 1 capsule 2 to 3 x /day as needed for Neuropathy pain 08/30/19   [name], MD  rivaroxaban (XARELTO) 20 MG TABS tablet Take 20 mg by mouth daily with supper.    [provider]  torsemide (DEMADEX) 20 MG tablet Take 2 tablets (40 mg total) by mouth  daily. Starting on Thursday 10/04/19 --- after repeat blood work is available 10/04/19   Roxan Hockey,  MD    Allergies    Amiodarone, Diovan [valsartan], Doxycycline, Flexeril [cyclobenzaprine], Keflex [cephalexin], Verapamil, and Codeine  Review of Systems   Review of Systems  All other systems reviewed and are negative. Ten systems reviewed and are negative for acute change, except as noted in the HPI.   Physical Exam Updated Vital Signs BP (!) 137/57 (BP Location: Right Arm)   Pulse 71   Temp (!) 97.5 F (36.4 C) (Oral)   Resp 17   Ht 5\' 3"  (1.6 m)   Wt 80.7 kg   SpO2 98%   BMI 31.53 kg/m   Physical Exam Vitals and nursing note reviewed.  Constitutional:      General: She is in acute distress.     Appearance: Normal appearance. She is not ill-appearing, toxic-appearing or diaphoretic.  HENT:     Head: Normocephalic and atraumatic.     Right Ear: External ear normal.     Left Ear: External ear normal.     Nose: Nose normal.     Mouth/Throat:     Mouth: Mucous membranes are moist.     Pharynx: Oropharynx is clear. No oropharyngeal exudate or posterior oropharyngeal erythema.  Eyes:     Extraocular Movements: Extraocular movements intact.  Cardiovascular:     Rate and Rhythm: Normal rate and regular rhythm.     Pulses: Normal pulses.     Heart sounds: Normal heart sounds. No murmur heard.  No friction rub. No gallop.   Pulmonary:     Effort: Pulmonary effort is normal. No respiratory distress.     Breath sounds: Normal breath sounds. No stridor. No wheezing, rhonchi or rales.  Abdominal:     General: Abdomen is flat.     Palpations: Abdomen is soft.     Tenderness: There is abdominal tenderness. There is right CVA tenderness and left CVA tenderness. There is no guarding or rebound.     Comments: Abdomen is soft.  Moderate TTP appreciated over the suprapubic region and left lower quadrant.  No rebound.  No guarding.  Bilateral CVA tenderness, right greater than  left.  Musculoskeletal:        General: Normal range of motion.     Cervical back: Normal range of motion and neck supple. No tenderness.     Right lower leg: Edema present.     Left lower leg: Edema present.     Comments: 2+ pitting edema noted bilaterally.  Skin:    General: Skin is warm and dry.     Comments: Chronic wounds noted to the bilateral feet.   Neurological:     General: No focal deficit present.     Mental Status: She is alert and oriented to person, place, and time.  Psychiatric:        Mood and Affect: Mood normal.        Behavior: Behavior normal.    ED Results / Procedures / Treatments   Labs (all labs ordered are listed, but only abnormal results are displayed) Labs Reviewed  URINALYSIS, ROUTINE W REFLEX MICROSCOPIC - Abnormal; Notable for the following components:      Result Value   APPearance HAZY (*)    Hgb urine dipstick LARGE (*)    Leukocytes,Ua MODERATE (*)    Bacteria, UA FEW (*)    Non Squamous Epithelial 0-5 (*)    All other components within normal limits  BASIC METABOLIC PANEL - Abnormal; Notable for the following components:   Sodium 130 (*)  Chloride 96 (*)    CO2 17 (*)    BUN 47 (*)    Creatinine, Ser 2.96 (*)    Calcium 8.3 (*)    GFR calc non Af Amer 15 (*)    GFR calc Af Amer 17 (*)    Anion gap 17 (*)    All other components within normal limits  CBC - Abnormal; Notable for the following components:   WBC 13.3 (*)    RBC 3.13 (*)    Hemoglobin 8.5 (*)    HCT 27.7 (*)    RDW 19.1 (*)    nRBC 0.8 (*)    All other components within normal limits  HEPATIC FUNCTION PANEL - Abnormal; Notable for the following components:   Total Protein 5.5 (*)    Albumin 2.1 (*)    Bilirubin, Direct 0.6 (*)    Indirect Bilirubin 0.2 (*)    All other components within normal limits  URINE CULTURE  SARS CORONAVIRUS 2 BY RT PCR (HOSPITAL ORDER, [facility])  LACTIC ACID, PLASMA  LACTIC ACID, PLASMA    EKG None  Radiology DG Chest Portable 1 View  Result Date: 09/27/2019 CLINICAL DATA:  79 year old female with history of shortness of breath. EXAM: PORTABLE CHEST 1 VIEW COMPARISON:  Chest x-ray 08/12/2019. FINDINGS: Lung volumes are normal. No acute consolidative airspace disease. No pleural effusions. Scattered areas of interstitial prominence throughout the lungs bilaterally along with widespread peribronchial cuffing, similar to prior examinations. No pneumothorax. Heart size is moderately enlarged. Upper mediastinal contours are within normal limits. Aortic atherosclerosis. Left-sided biventricular pacemaker/AICD with lead tips projecting over the expected location of the right atrium, right ventricle and overlying the left ventricle via the coronary sinus and coronary veins. IMPRESSION: 1. No radiographic evidence of acute cardiopulmonary disease. 2. Chronic lung changes suggestive of pulmonary fibrosis redemonstrated, as above. 3. Moderate cardiomegaly. 4. Aortic atherosclerosis. Electronically Signed   By: Vinnie Langton M.D.   On: 09/27/2019 18:19   CT Renal Stone Study  Addendum Date: 09/18/2019   ADDENDUM REPORT: 10/11/2019 18:42 ADDENDUM: These results were called by telephone at the time of interpretation on 09/15/2019 at 6:41 pm to provider [facility] , who verbally acknowledged these results. Electronically Signed   By: Fidela Salisbury MD   On: 09/14/2019 18:42   Result Date: 09/24/2019 CLINICAL DATA:  Flank pain, acute renal failure, right flank pain EXAM: CT ABDOMEN AND PELVIS WITHOUT CONTRAST TECHNIQUE: Multidetector CT imaging of the abdomen and pelvis was performed following the standard protocol without IV contrast. COMPARISON:  09/28/2019 FINDINGS: Lower chest: Bibasilar ground-glass pulmonary infiltrates and peripheral reticulation appears slightly progressive at the left lung base since prior examination may reflect the sequela of subacute atypical infection. Mild global  cardiomegaly is present with particular right atrial enlargement. Extensive coronary artery calcifications present. Pacemaker leads are seen within the right ventricle and left ventricular venous outflow. Hepatobiliary: Status post cholecystectomy. Liver is unremarkable. No intra or extrahepatic biliary ductal dilation. Pancreas: Unremarkable Spleen: Unremarkable Adrenals/Urinary Tract: The adrenal glands and kidneys are unremarkable. The bladder is largely decompressed. Stomach/Bowel: Since the prior examination, there has developed moderate free intraperitoneal gas as well as gas dissecting within the mesentery. Additionally, there have developed two distinct pericolonic gas and fluid collections within the left hemipelvis measuring 8.4 x 5.2 by 4.9 cm at axial image # 64/3 and 4.9 x 3.7 x 2.5 cm on axial image # 73/3 and sagittal image # 60/7. The findings are most  in keeping with perforated sigmoid diverticulitis with development of at least to pericolonic abscesses. No evidence of obstruction. Stomach, small bowel, are unremarkable. Mild to moderate sigmoid diverticulosis is again identified. 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 Extensive atherosclerotic calcification within the supra celiac and juxtarenal aorta. Infrarenal abdominal aortic endograft is in place extending into the common iliac arteries bilaterally. This is better assessed on prior CT a of 09/28/2019. Extensive atherosclerotic calcification is seen within the lower extremity arterial inflow bilaterally. No pathologic adenopathy within the abdomen and pelvis. Reproductive: Unremarkable Other: There has developed extensive subcutaneous edema within the dependent body wall possibly related to developing anasarca. Musculoskeletal: No acute bone abnormality. Degenerative changes are seen within the lumbar spine. IMPRESSION: Perforated sigmoid  diverticulitis with moderate free intraperitoneal gas and development of to pericolonic abscesses within the pelvis. No evidence of obstruction. Progressive dependent body wall edema may reflect changes of progressive anasarca. Progressive bibasilar pulmonary infiltrates possibly reflecting changes of subacute infection. Aortic Atherosclerosis (ICD10-I70.0). Electronically Signed: By: Fidela Salisbury MD On: 09/23/2019 18:36   Procedures .Critical Care Performed by: Rayna Sexton, PA-C Authorized by: Rayna Sexton, PA-C   Critical care provider statement:    Critical care time (minutes):  45   Critical care was necessary to treat or prevent imminent or life-threatening deterioration of the following conditions: intrabdominal infection.   Critical care was time spent personally by me on the following activities:  Discussions with consultants, evaluation of patient's response to treatment, examination of patient, ordering and performing treatments and interventions, ordering and review of laboratory studies, ordering and review of radiographic studies, pulse oximetry, re-evaluation of patient's condition, obtaining history from patient or surrogate and review of old charts    Medications Ordered in ED Medications  piperacillin-tazobactam (ZOSYN) IVPB 3.375 g (has no administration in time range)  sodium chloride 0.9 % bolus 1,000 mL (has no administration in time range)   ED Course  I have reviewed the triage vital signs and the nursing notes.  Pertinent labs & imaging results that were available during my care of the patient were reviewed by me and considered in my medical decision making (see chart for details).  Clinical Course as of Oct 06 1951  Sat Oct 06, 2019  1710 Noted to be 1.68, 4 days ago.  Creatinine(!): 2.96 [LJ]  1755 Hemoglobin(!): 8.5 [LJ]  1755 Sodium(!): 130 [LJ]  1755 Chloride(!): 96 [LJ]  1757 WBC(!): 13.3 [LJ]  1902 I was notified by radiology regarding  patient's CT scan findings.  Discussed with the on-call surgeon who is planning to consult on the patient.  Patient was started on Zosyn as well as maintenance fluids.  Discussed findings with the patient.  We will plan on admitting to medicine.   [LJ]  1916 I spoke to the hospitalist team who is planning on admitting the patient at this time.   [LJ]    Clinical Course User Index [LJ] Rayna Sexton, PA-C   MDM Rules/Calculators/A&P                          Pt is a 79 y.o. female that present with a history, physical exam, ED Clinical Course as noted above.   Patient was initially sent to the emergency department by her PCP due to a likely AKI.  Patient does have a history of AKI.  She was recently admitted for a GI bleed  and found to have diverticulitis.  Her antibiotics were switched to ciprofloxacin as well as Flagyl 2 days ago.  Due to her continued abdominal pain as well as flank pain, decided to obtain a new CT scan of her abdomen.  Findings noted above.  Patient was started on maintenance fluids as well as Zosyn.  Discussed with on-call general surgery who is going to consult on the patient today.  I have ordered a COVID-19 test and discussed with the hospitalist for admission.  I discussed findings as well as plan with the patient and she is amenable with this.  Note: Portions of this report may have been transcribed using voice recognition software. Every effort was made to ensure accuracy; however, inadvertent computerized transcription errors may be present.   Final Clinical Impression(s) / ED Diagnoses Final diagnoses:  Perforation and abscess of large intestine concurrent with and due to diverticulitis   Rx / DC Orders ED Discharge Orders    None       Rayna Sexton, PA-C 09/25/2019 1955    Valarie Merino, MD 10/11/19 [phone]

## 2019-10-06 NOTE — H&P (Signed)
[facility] History and Physical  [name]:[id number] DOB: 12/19/1940 DOA: 09/24/2019  Referring EDP: Vernia Buff PCP: [name], MD   Chief Complaint: Abnormal Lab, Right Flank Pain  HPI: Katie [name] is a 79 y.o. female with PMH of HTN, HLD, cAfib on Xarelto, CHF with a BiVent AICD, CKD3, AAA, Hypothyroidism, Gout and Rheumatoid Arthritiswho presented to ED after AKI found by PCP and found to have perforated sigmoid diverticulitis with air into the mesentery, some moderate free air and 2 intra-abdominal abscesses.  Patient presents after being sent by PCP for AKI found on labs. She was recently admitted earlier this month for lower GI bleed and found to have diverticulitis and started on antibiotics. Reports diarrhea for several weeks with about 2-3 loose stools daily. Denies water stools. She stopped taking antibiotics about 4-5 days ago due to side effects. For the past month, reports right flank pain, suprapubic abdominal pain and generalized abdominal pain. Reports pain has not necessarily worsened but has persisted. She has not noticed a pattern of when pain is worse but pain is intermittent. She has continued to have some blood in her stools. Reports minimal appetite for several weeks and last ate 2 days ago. Last took Xarelto two days ago. She did vomit once  yesterday and today. Reports some "tingling" with urination. Denies headache, dizziness, fever, chills, cough, SOB, chest pain, constipation, dysuria, hematuria, melena, difficulty moving arms/legs, speech difficulty, confusion or any other complaints.  In the ED: Vitals stable on room air. Labs remarkable for Na 130, Cr 2.96, CO2 17, AG 17, WBC 13.3, Hgb 8.5, UA with moderate leuks.  CXR: non-acute CT Renal Study with perforated sigmoid diverticulitis with air into the mesentery, some moderate free air and 2 intra-abdominal abscesses.  Patient was started on Zosyn and given 1L IVF. General Surgery consulted by  EDP and saw patient. Admission by hospitalist requested.  Review of Systems:  All other systems negative unless noted above in HPI.   Past Medical History:  Diagnosis Date  . AAA (abdominal aortic aneurysm) ([place])   . AICD (automatic cardioverter/defibrillator) present 11/10/2017  . Arthritis    "some in my knees" (11/10/2017)  . Atrial fibrillation ([place])   . CHF (congestive heart failure) ([place])   . Chronic bronchitis ([place])   . COPD (chronic obstructive pulmonary disease) ([place])   . Depression   . GERD (gastroesophageal reflux disease)   . Gout    "daily RX" (11/10/2017)  . History of hiatal hernia   . Hyperlipidemia   . Hypertension   . Hypothyroid   . Migraine headache    "hx; none since 1980s" (11/10/2017)  . Osteopenia   . Pneumonia    "~ 3 times" (11/10/2017)  . PVD (peripheral vascular disease) ([place])    Past Surgical History:  Procedure Laterality Date  . ABDOMINAL AORTIC ENDOVASCULAR STENT GRAFT N/A 04/11/2013   Procedure: ABDOMINAL AORTIC ENDOVASCULAR STENT GRAFT WITH RIGHT FEMORAL PATCH ANGIOPLASTY;  Surgeon: Mal Misty, MD;  Location: [facility];  Service: Vascular;  Laterality: N/A;  . AV NODE ABLATION N/A 09/13/2018   Procedure: AV NODE ABLATION;  Surgeon: Evans Lance, MD;  Location: [facility];  Service: Cardiovascular;  Laterality: N/A;  . BIV ICD INSERTION CRT-D  11/10/2017  . BIV ICD INSERTION CRT-D N/A 11/10/2017   Procedure: BIV ICD INSERTION CRT-D;  Surgeon: Evans Lance, MD;  Location: [facility];  Service: Cardiovascular;  Laterality: N/A;  . BREAST SURGERY  LEFT BREAST BIOPSY  . CARDIAC CATHETERIZATION    . CATARACT EXTRACTION W/ INTRAOCULAR LENS  IMPLANT, BILATERAL Bilateral   . CHOLECYSTECTOMY OPEN  1972  . COLONOSCOPY    . ESOPHAGEAL BRUSHING  09/29/2019   Procedure: ESOPHAGEAL BRUSHING;  Surgeon: Rogene Houston, MD;  [facility];  Service: Endoscopy;;  for candida   . ESOPHAGOGASTRODUODENOSCOPY N/A 09/29/2019    Procedure: ESOPHAGOGASTRODUODENOSCOPY (EGD);  Surgeon: Rogene Houston, MD;  [facility];  Service: Endoscopy;  Laterality: N/A;  . EYE SURGERY Bilateral    "bleeding in my eyes"  . FRACTURE SURGERY    . TIBIA FRACTURE SURGERY Left 1970s  . TONSILLECTOMY    . VARICOSE VEIN SURGERY Bilateral    "laser"   Social History:  reports that she quit smoking about 17 years ago. Her smoking use included cigarettes. She has a 108.00 pack-year smoking history. She has never used smokeless tobacco. She reports that she does not drink alcohol and does not use drugs.  Allergies  Allergen Reactions  . Amiodarone Other (See Comments)    PULMONARY TOXICITY  . Diovan [Valsartan] Other (See Comments)    HYPOTENSION  . Doxycycline Diarrhea and Other (See Comments)    VISUAL DISTURBANCE  . Flexeril [Cyclobenzaprine] Other (See Comments)    FATIGUE  . Keflex [Cephalexin] Diarrhea  . Verapamil Other (See Comments)    EDEMA  . Augmentin [Amoxicillin-Pot Clavulanate] Diarrhea and Nausea And Vomiting  . Ciprofloxacin Diarrhea and Nausea And Vomiting  . Codeine Hives    Family History  Problem Relation Age of Onset  . Cirrhosis Mother   . Cancer Mother 75       PANCREAS  . Heart defect Sister   . Breast cancer Sister        age 2  . Heart disease Sister   . Stroke Sister   . Alcohol abuse Father   . Depression Father   . Hypertension Brother   . Hyperlipidemia Son   . Heart disease Daughter     Prior to Admission medications   Medication Sig Start Date End Date Taking? Authorizing Provider  acetaminophen (TYLENOL) 500 MG tablet Take 1,000 mg by mouth every 6 (six) hours as needed for mild pain or moderate pain.    [provider]  albuterol (PROAIR HFA) 108 (90 Base) MCG/ACT inhaler Use 2 Inhalations 15 minutes Apart every 4 hours to Rescue Asthma Attack Patient taking differently: Inhale 2 puffs into the lungs See admin instructions. Use 2 inhalations 15 minutes apart  every 4 hours as needed for asthma attack 07/29/18   [name], MD  allopurinol (ZYLOPRIM) 300 MG tablet Take 0.5 tablets (150 mg total) by mouth daily. 09/20/19   Vicie Mutters, PA-C  amoxicillin-clavulanate (AUGMENTIN) 875-125 MG tablet Take 1 tablet by mouth 2 (two) times daily for 5 days. 10/02/19 10/07/19  Roxan Hockey, MD  aspirin EC 81 MG EC tablet Take 1 tablet (81 mg total) by mouth daily with breakfast. Swallow whole. 10/03/19   Roxan Hockey, MD  budesonide-formoterol (SYMBICORT) 80-4.5 MCG/ACT inhaler Use 2 inhalations 30 minutes apart 2 x /day (every 12 hours) Patient taking differently: Inhale 2 puffs into the lungs daily.  08/28/19   [name], MD  cetirizine (ZYRTEC) 10 MG tablet Take 10 mg by mouth daily as needed for allergies.     [provider]  ciprofloxacin (CIPRO) 500 MG tablet Take 1 tablet 2 x /day with Meals for Infection 10/04/19   [name],  MD  Eyelid Cleansers (AVENOVA) 0.01 % SOLN Apply 1 application topically in the morning and at bedtime. Wash eyelids twice daily with cleaner 11/28/17   [provider]  famotidine (PEPCID) 40 MG tablet Take 1 tablet Daily to Prevent Heartburn & Indigestion 10/03/19   [name], MD  ipratropium (ATROVENT) 0.06 % nasal spray Use 1 to 2 sprays each Nostril 2 to 3 x / day as needed Patient taking differently: Place 1-2 sprays into both nostrils 3 (three) times daily as needed for rhinitis (congestion).  02/27/19 02/28/20  [name], MD  ipratropium-albuterol (DUONEB) 0.5-2.5 (3) MG/3ML SOLN Inhale 3 mLs into the lungs every 6 (six) hours as needed (for shortness of breath or wheezing). 02/27/19   [name], MD  L-Lysine 500 MG TABS Take 500 mg by mouth daily as needed.     [provider]  leflunomide (ARAVA) 20 MG tablet Take 10 mg by mouth daily.  06/18/19   [provider]  levothyroxine (SYNTHROID) 125 MCG tablet Take 1 tablet (125 mcg total) by mouth daily  before breakfast. 08/18/19   Shelly Coss, MD  metroNIDAZOLE (FLAGYL) 500 MG tablet Take 1 tablet 3 x /day with meals for Infection 10/04/19   [name], MD  montelukast (SINGULAIR) 10 MG tablet TAKE 1 TABLET DAILY FOR ALLERGIES & ASTHMA Patient taking differently: Take 10 mg by mouth daily. for Allergies & Asthma 02/27/19   [name], MD  OXYGEN Inhale 2 L into the lungs continuous.    [provider]  Polyethyl Glycol-Propyl Glycol (SYSTANE) 0.4-0.3 % SOLN Place 1 drop into both eyes 4 (four) times daily as needed (for dry eyes).     [provider]  potassium chloride SA (KLOR-CON) 20 MEQ tablet Take 20 mEq by mouth daily.     [provider]  pravastatin (PRAVACHOL) 20 MG tablet Take 1 tablet at Bedtime for Cholesterol Patient taking differently: Take 10 mg by mouth every other day.  09/21/19   [name], MD  pregabalin (LYRICA) 25 MG capsule Take 1 capsule 2 to 3 x /day as needed for Neuropathy pain Patient taking differently: Take 25 mg by mouth 3 (three) times daily. Take 1 capsule 2 to 3 x /day as needed for Neuropathy pain 08/30/19   [name], MD  rivaroxaban (XARELTO) 20 MG TABS tablet Take 20 mg by mouth daily with supper.    [provider]  torsemide (DEMADEX) 20 MG tablet Take 2 tablets (40 mg total) by mouth daily. Starting on Thursday 10/04/19 --- after repeat blood work is available 10/04/19   Roxan Hockey, MD   Physical Exam: Vitals:   09/26/2019 1029 10/07/2019 1243 10/01/2019 1509  BP: (!) 126/60 (!) 137/67 (!) 137/57  Pulse: 70 70 71  Resp: 17 16 17   Temp: (!) 97.5 F (36.4 C)    TempSrc: Oral    SpO2: 97% 98% 98%  Weight: 80.7 kg    Height: 5\' 3"  (1.6 m)      Wt Readings from Last 3 Encounters:  10/10/2019 80.7 kg  09/28/19 80 kg  09/26/19 80.9 kg    . General:  Appears calm and comfortable. AAOx4.  . Eyes: EOMI, normal lids, irises & conjunctiva . ENT: grossly normal hearing, lips & tongu, dry mucous  membranes . Neck: normal ROM . Cardiovascular: RRR, no m/r/g. 2+ pitting LE edema. Marland Kitchen Respiratory: CTA bilaterally, no w/r/r. Normal respiratory effort. . Abdomen: soft, no distention, TTP in suprapubic region and left mid quadrant, Right  CVA tenderness, healed midline scar noted, depressed bowel sounds, no rebound or guarding  . Skin: no rash or induration seen on limited exam . Musculoskeletal: grossly normal tone BUE/BLE . Psychiatric: grossly normal mood and affect, speech fluent and appropriate . Neurologic: grossly non-focal.          Labs on Admission:  Basic Metabolic Panel: Recent Labs  Lab 10/01/19 0607 10/02/19 0812 10/05/19 1134 10/12/2019 1042  NA 137 136 132* 130*  K 4.0 3.7 4.6 4.9  CL 100 102 96* 96*  CO2 25 23 20  17*  GLUCOSE 109* 96 76 88  BUN 31* 31* 45* 47*  CREATININE 1.60* 1.68* 2.83* 2.96*  CALCIUM 8.0* 7.9* 8.8 8.3*   Liver Function Tests: Recent Labs  Lab 10/05/19 1134 10/09/2019 1042  AST 14 18  ALT 14 18  ALKPHOS  --  69  BILITOT 0.7 0.8  PROT 5.6* 5.5*  ALBUMIN  --  2.1*   No results for input(s): LIPASE, AMYLASE in the last 168 hours. No results for input(s): AMMONIA in the last 168 hours. CBC: Recent Labs  Lab 09/30/19 0559 10/01/19 0607 10/02/19 0812 10/05/19 1134 09/24/2019 1042  WBC 12.7* 13.3* 10.5 11.4* 13.3*  NEUTROABS  --   --   --  9,941*  --   HGB 7.8* 8.6* 8.3* 9.0* 8.5*  HCT 25.4* 27.3* 26.0* 28.8* 27.7*  MCV 92.0 92.2 91.2 87.3 88.5  PLT 216 229 188 293 328   Cardiac Enzymes: No results for input(s): CKTOTAL, CKMB, CKMBINDEX, TROPONINI in the last 168 hours.  BNP (last 3 results) Recent Labs    08/12/19 1349 08/13/19 0654  BNP 518.4* 713.5*    ProBNP (last 3 results) No results for input(s): PROBNP in the last 8760 hours.  CBG: No results for input(s): GLUCAP in the last 168 hours.  Radiological Exams on Admission: DG Chest Portable 1 View  Result Date: 10/11/2019 CLINICAL DATA:  79 year old female with  history of shortness of breath. EXAM: PORTABLE CHEST 1 VIEW COMPARISON:  Chest x-ray 08/12/2019. FINDINGS: Lung volumes are normal. No acute consolidative airspace disease. No pleural effusions. Scattered areas of interstitial prominence throughout the lungs bilaterally along with widespread peribronchial cuffing, similar to prior examinations. No pneumothorax. Heart size is moderately enlarged. Upper mediastinal contours are within normal limits. Aortic atherosclerosis. Left-sided biventricular pacemaker/AICD with lead tips projecting over the expected location of the right atrium, right ventricle and overlying the left ventricle via the coronary sinus and coronary veins. IMPRESSION: 1. No radiographic evidence of acute cardiopulmonary disease. 2. Chronic lung changes suggestive of pulmonary fibrosis redemonstrated, as above. 3. Moderate cardiomegaly. 4. Aortic atherosclerosis. Electronically Signed   By: Vinnie Langton M.D.   On: 09/17/2019 18:19   CT Renal Stone Study  Addendum Date: 10/03/2019   ADDENDUM REPORT: 09/28/2019 18:42 ADDENDUM: These results were called by telephone at the time of interpretation on 09/26/2019 at 6:41 pm to provider [facility] , who verbally acknowledged these results. Electronically Signed   By: Fidela Salisbury MD   On: 09/30/2019 18:42   Result Date: 09/22/2019 CLINICAL DATA:  Flank pain, acute renal failure, right flank pain EXAM: CT ABDOMEN AND PELVIS WITHOUT CONTRAST TECHNIQUE: Multidetector CT imaging of the abdomen and pelvis was performed following the standard protocol without IV contrast. COMPARISON:  09/28/2019 FINDINGS: Lower chest: Bibasilar ground-glass pulmonary infiltrates and peripheral reticulation appears slightly progressive at the left lung base since prior examination may reflect the sequela of subacute atypical infection. Mild global cardiomegaly is  present with particular right atrial enlargement. Extensive coronary artery calcifications present.  Pacemaker leads are seen within the right ventricle and left ventricular venous outflow. Hepatobiliary: Status post cholecystectomy. Liver is unremarkable. No intra or extrahepatic biliary ductal dilation. Pancreas: Unremarkable Spleen: Unremarkable Adrenals/Urinary Tract: The adrenal glands and kidneys are unremarkable. The bladder is largely decompressed. Stomach/Bowel: Since the prior examination, there has developed moderate free intraperitoneal gas as well as gas dissecting within the mesentery. Additionally, there have developed two distinct pericolonic gas and fluid collections within the left hemipelvis measuring 8.4 x 5.2 by 4.9 cm at axial image # 64/3 and 4.9 x 3.7 x 2.5 cm on axial image # 73/3 and sagittal image # 60/7. The findings are most in keeping with perforated sigmoid diverticulitis with development of at least to pericolonic abscesses. No evidence of obstruction. Stomach, small bowel, are unremarkable. Mild to moderate sigmoid diverticulosis is again identified. 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 Extensive atherosclerotic calcification within the supra celiac and juxtarenal aorta. Infrarenal abdominal aortic endograft is in place extending into the common iliac arteries bilaterally. This is better assessed on prior CT a of 09/28/2019. Extensive atherosclerotic calcification is seen within the lower extremity arterial inflow bilaterally. No pathologic adenopathy within the abdomen and pelvis. Reproductive: Unremarkable Other: There has developed extensive subcutaneous edema within the dependent body wall possibly related to developing anasarca. Musculoskeletal: No acute bone abnormality. Degenerative changes are seen within the lumbar spine. IMPRESSION: Perforated sigmoid diverticulitis with moderate free intraperitoneal gas and development of to pericolonic abscesses within the pelvis. No  evidence of obstruction. Progressive dependent body wall edema may reflect changes of progressive anasarca. Progressive bibasilar pulmonary infiltrates possibly reflecting changes of subacute infection. Aortic Atherosclerosis (ICD10-I70.0). Electronically Signed: By: Fidela Salisbury MD On: 10/05/2019 18:36    EKG: Pending  Assessment/Plan Principal Problem:   Perforated diverticulum Active Problems:   Essential hypertension   Atrial fibrillation (HCC)   Chronic combined systolic and diastolic CHF (congestive heart failure) ([facility])   COPD (chronic obstructive pulmonary disease) ([facility])   Gastroesophageal reflux disease without esophagitis   CKD (chronic kidney disease) stage 3, GFR 30-59 ml/min   Hyperlipidemia, mixed   PVD (peripheral vascular disease) with claudication ([facility])   AAA (abdominal aortic aneurysm) without rupture ([facility])   Gout   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   Diverticulitis   AKI (acute kidney injury) ([place])   Intra-abdominal abscess ([place])   Hyponatremia  79 y.o. female with PMH of HTN, HLD, cAfib on Xarelto, CHF with a BiVent AICD, CKD3, AAA, Hypothyroidism, Gout and Rheumatoid Arthritiswho presented to ED after AKI found by PCP and found to have perforated sigmoid diverticulitis with air into the mesentery, some moderate free air and 2 intra-abdominal abscesses.  Abdominal Pain Perforated sigmoid diverticulitis with air into the mesentery Two intra-abdominal abscesses - hx diverticulitis on last admission in early July and treated with Augmentin, Cipro, Flagyl - patient with persistent diarrhea for about 3-4 weeks with some blood in stools - WBC 13.3, Lactate pending, vitals stable  - colonoscopy was scheduled in about 4 weeks  - presents after AKI found by PCP and found to have above - Gen Surg consulted: NPO, IR consult, may need colectomy if not improved with IR drains - IR consulted by Surgery - Hold Xarelto - Zosyn - High risk  surgical candidate should surgery be indicated   AKI on CKDIIIb - Cr 2.96 on admission,  baseline around 1.4 - likely multifactorial, patient with poor PO intake and diarrhea, suspect some hypovolemia although does have pitting LE edema - s/p 1 L NS in ED - Urine studies ordered - Hold Torsemide, Lyrica, Allopurinol - renally adjust medications, avoid nephrotoxic agents / dehydration  / hypotension  Hyponatremia - Na 130 on admission; previously 136 on 720 - Urine studies ordered - s/p 1 L NS in ED; hold Torsemide - Trend   Hyperlipidemia - cont home statin  History of atherosclerosis of native coronary artery of native heart/atrial fibrillation - CHA2Ds2-VASc score= 5 - Xarelto will be held at this time due to GI bleed and pre-operative state - no rate control med  - daily weights, I's and O's - daily Mag  Hypothyroidism - Continue home Synthroid   Gout Neuropathic pain - holding home Allopurinol and Lyrica due to AKI  Code Status: Full DVT Prophylaxis: Holding as pre-procedural Family Communication: None Disposition Plan: Admit to inpatient. Patient acutely ill requiring specialty consultation and intervention. Patient is at high risk for further decompensation due to age and co-morbidities. Anticipate discharge home in 5-7 days.    Time spent: 70 minutes  Chauncey Mann, MD [facility] Pager [phone]

## 2019-10-07 ENCOUNTER — Inpatient Hospital Stay (HOSPITAL_COMMUNITY): Payer: Medicare Other

## 2019-10-07 DIAGNOSIS — I480 Paroxysmal atrial fibrillation: Secondary | ICD-10-CM

## 2019-10-07 DIAGNOSIS — I739 Peripheral vascular disease, unspecified: Secondary | ICD-10-CM

## 2019-10-07 DIAGNOSIS — E782 Mixed hyperlipidemia: Secondary | ICD-10-CM

## 2019-10-07 DIAGNOSIS — Z95810 Presence of automatic (implantable) cardiac defibrillator: Secondary | ICD-10-CM

## 2019-10-07 DIAGNOSIS — K5792 Diverticulitis of intestine, part unspecified, without perforation or abscess without bleeding: Secondary | ICD-10-CM

## 2019-10-07 DIAGNOSIS — K651 Peritoneal abscess: Secondary | ICD-10-CM

## 2019-10-07 DIAGNOSIS — N1832 Chronic kidney disease, stage 3b: Secondary | ICD-10-CM

## 2019-10-07 DIAGNOSIS — N179 Acute kidney failure, unspecified: Secondary | ICD-10-CM

## 2019-10-07 DIAGNOSIS — K5720 Diverticulitis of large intestine with perforation and abscess without bleeding: Principal | ICD-10-CM

## 2019-10-07 DIAGNOSIS — J431 Panlobular emphysema: Secondary | ICD-10-CM

## 2019-10-07 DIAGNOSIS — I10 Essential (primary) hypertension: Secondary | ICD-10-CM

## 2019-10-07 LAB — CBC
HCT: 24.7 % — ABNORMAL LOW (ref 36.0–46.0)
Hemoglobin: 7.9 g/dL — ABNORMAL LOW (ref 12.0–15.0)
MCH: 27.6 pg (ref 26.0–34.0)
MCHC: 32 g/dL (ref 30.0–36.0)
MCV: 86.4 fL (ref 80.0–100.0)
Platelets: 298 10*3/uL (ref 150–400)
RBC: 2.86 MIL/uL — ABNORMAL LOW (ref 3.87–5.11)
RDW: 18.9 % — ABNORMAL HIGH (ref 11.5–15.5)
WBC: 11.3 10*3/uL — ABNORMAL HIGH (ref 4.0–10.5)
nRBC: 0.9 % — ABNORMAL HIGH (ref 0.0–0.2)

## 2019-10-07 LAB — BASIC METABOLIC PANEL
Anion gap: 13 (ref 5–15)
BUN: 49 mg/dL — ABNORMAL HIGH (ref 8–23)
CO2: 20 mmol/L — ABNORMAL LOW (ref 22–32)
Calcium: 7.9 mg/dL — ABNORMAL LOW (ref 8.9–10.3)
Chloride: 99 mmol/L (ref 98–111)
Creatinine, Ser: 2.75 mg/dL — ABNORMAL HIGH (ref 0.44–1.00)
GFR calc Af Amer: 19 mL/min — ABNORMAL LOW (ref >60)
GFR calc non Af Amer: 16 mL/min — ABNORMAL LOW (ref >60)
Glucose, Bld: 72 mg/dL (ref 70–99)
Potassium: 5.3 mmol/L — ABNORMAL HIGH (ref 3.5–5.1)
Sodium: 132 mmol/L — ABNORMAL LOW (ref 135–145)

## 2019-10-07 LAB — SODIUM, URINE, RANDOM: Sodium, Ur: <10 mmol/L

## 2019-10-07 LAB — MAGNESIUM: Magnesium: 2.4 mg/dL (ref 1.7–2.4)

## 2019-10-07 MED ORDER — MIDAZOLAM HCL 2 MG/2ML IJ SOLN
INTRAMUSCULAR | Status: AC
  Filled 2019-10-07: qty 6

## 2019-10-07 MED ORDER — TRAZODONE HCL 50 MG PO TABS
50.0000 mg | ORAL_TABLET | Freq: Every evening | ORAL | Status: DC | PRN
  Administered 2019-10-07 – 2019-10-08 (×2): 50 mg via ORAL
  Filled 2019-10-07 (×2): qty 1

## 2019-10-07 MED ORDER — FENTANYL CITRATE (PF) 100 MCG/2ML IJ SOLN
INTRAMUSCULAR | Status: AC | PRN
  Administered 2019-10-07: 25 ug via INTRAVENOUS
  Administered 2019-10-07: 50 ug via INTRAVENOUS

## 2019-10-07 MED ORDER — ONDANSETRON HCL 4 MG/2ML IJ SOLN
4.0000 mg | Freq: Four times a day (QID) | INTRAMUSCULAR | Status: DC | PRN
  Administered 2019-10-07 – 2019-10-09 (×3): 4 mg via INTRAVENOUS
  Filled 2019-10-07 (×4): qty 2

## 2019-10-07 MED ORDER — PIPERACILLIN-TAZOBACTAM IN DEX 2-0.25 GM/50ML IV SOLN
2.2500 g | Freq: Three times a day (TID) | INTRAVENOUS | Status: DC
  Administered 2019-10-07 – 2019-10-08 (×5): 2.25 g via INTRAVENOUS
  Filled 2019-10-07 (×8): qty 50

## 2019-10-07 MED ORDER — SODIUM CHLORIDE 0.9 % IV SOLN: INTRAVENOUS | Status: DC

## 2019-10-07 MED ORDER — SODIUM CHLORIDE 0.9% FLUSH
5.0000 mL | Freq: Three times a day (TID) | INTRAVENOUS | Status: DC
  Administered 2019-10-07 – 2019-10-09 (×5): 5 mL

## 2019-10-07 MED ORDER — CALCIUM CARBONATE ANTACID 500 MG PO CHEW
1.0000 | CHEWABLE_TABLET | Freq: Once | ORAL | Status: AC
  Administered 2019-10-07: 200 mg via ORAL
  Filled 2019-10-07: qty 1

## 2019-10-07 MED ORDER — ONDANSETRON HCL 4 MG/5ML PO SOLN
4.0000 mg | Freq: Four times a day (QID) | ORAL | Status: DC | PRN
  Filled 2019-10-07: qty 5

## 2019-10-07 MED ORDER — SODIUM ZIRCONIUM CYCLOSILICATE 10 G PO PACK
10.0000 g | PACK | Freq: Once | ORAL | Status: AC
  Administered 2019-10-07: 10 g via ORAL
  Filled 2019-10-07: qty 1

## 2019-10-07 MED ORDER — FENTANYL CITRATE (PF) 100 MCG/2ML IJ SOLN
INTRAMUSCULAR | Status: AC
  Filled 2019-10-07: qty 4

## 2019-10-07 MED ORDER — LIDOCAINE HCL 1 % IJ SOLN
INTRAMUSCULAR | Status: AC
  Filled 2019-10-07: qty 20

## 2019-10-07 MED ORDER — MIDAZOLAM HCL 2 MG/2ML IJ SOLN
INTRAMUSCULAR | Status: AC | PRN
  Administered 2019-10-07 (×2): 1 mg via INTRAVENOUS

## 2019-10-07 NOTE — Progress Notes (Signed)
Patient taken to IR for drain placement.

## 2019-10-07 NOTE — Procedures (Signed)
  Procedure: CT abscess drain placement RLQ 35f EBL:   minimal Complications:  none immediate  See full dictation in [facility].  Dillard Cannon MD Main # [phone] Pager  [phone]

## 2019-10-07 NOTE — Progress Notes (Signed)
Pharmacy Antibiotic Note  Katie [name] is a 79 y.o. female admitted on 09/16/2019 with perforated diverticulitis and 2 pericolonic abscesses.  Pharmacy has been consulted for Zosyn dosing in setting of AKI.  Plan: Zosyn 2.25g IV q8h (4 hour infusion). Monitor cultures, clinical status, renal fx Narrow abx as able and f/u duration     Height: 5\' 3"  (160 cm) Weight: 80.7 kg (178 lb) IBW/kg (Calculated) : 52.4  Temp (24hrs), Avg:98.4 F (36.9 C), Min:97.6 F (36.4 C), Max:98.9 F (37.2 C)  Recent Labs  Lab 10/01/19 0607 10/02/19 0812 10/05/19 1134 10/04/2019 1042 09/19/2019 2041 10/09/2019 2231 10/07/19 0151  WBC 13.3* 10.5 11.4* 13.3*  --   --  11.3*  CREATININE 1.60* 1.68* 2.83* 2.96*  --   --  2.75*  LATICACIDVEN  --   --   --   --  1.8 1.9  --     Estimated Creatinine Clearance: 17.2 mL/min (A) (by C-G formula based on SCr of 2.75 mg/dL (H)).    Allergies  Allergen Reactions  . Amiodarone Other (See Comments)    PULMONARY TOXICITY  . Diovan [Valsartan] Other (See Comments)    HYPOTENSION  . Doxycycline Diarrhea and Other (See Comments)    VISUAL DISTURBANCE  . Flexeril [Cyclobenzaprine] Other (See Comments)    FATIGUE  . Keflex [Cephalexin] Diarrhea  . Verapamil Other (See Comments)    EDEMA  . Ciprofloxacin Diarrhea and Nausea And Vomiting  . Augmentin [Amoxicillin-Pot Clavulanate] Diarrhea and Nausea And Vomiting    Tolerating piperacillin/tazobactam, previously tolerated ampicillin/sulbactam at prior visit  . Codeine Hives    Antimicrobials this admission: 7/25 Zosyn >>    Thank you for allowing pharmacy to be a part of this patient's care.  Alfonse Spruce, PharmD PGY2 ID Pharmacy Resident Phone between 7 am - 3:30 pm: [phone]  Please check AMION for all [place] phone numbers After 10:00 PM, call [place] [phone]   10/07/2019 11:41 AM

## 2019-10-07 NOTE — H&P (Signed)
Chief Complaint: Patient was seen in consultation today for  Chief Complaint  Patient presents with  . Abnormal Lab    Referring Physician(s): Dr. Grandville Silos  Supervising Physician: Arne Cleveland  Patient Status: [facility] - In-pt  History of Present Illness: Katie [name] is a 79 y.o. female with a medical history that includes thyroid disease, COPD, CHF, CKD III, atrial fibrillation (on xarelto), and an abdominal aortic aneurysm. She does have an AICD.    She was recently admitted to [facility] 09/28/19-10/02/19 for a GI bleed. CT imaging showed no evidence of active GI bleeding but showed findings most consistent with acute sigmoid colitis; no evidence of perforation, fluid collection or abscess. She was treated with IV antibiotics and discharged home with PO augmentin. She was switched to Cipro and flagyl two days ago due to significant watery brown diarrhea from the augmentin.   Her PCP sent her to the ED 09/18/2019 for abnormal kidney function labs. While in the ED she endorsed significant flank and abdominal pain.   CT Abdomen/Pelvis 10/11/2019: Stomach/Bowel: Since the prior examination, there has developed moderate free intraperitoneal gas as well as gas dissecting within the mesentery. Additionally, there have developed two distinct pericolonic gas and fluid collections within the left hemipelvis measuring 8.4 x 5.2 by 4.9 cm at axial image # 64/3 and 4.9 x 3.7 x 2.5 cm on axial image # 73/3 and sagittal image # 60/7. The findings are most in keeping with perforated sigmoid diverticulitis with development of at least to pericolonic abscesses. No evidence of obstruction. Stomach, small bowel, are unremarkable. Mild to moderate sigmoid diverticulosis is again identified.  Interventional Radiology has been asked to evaluate this patient for an image-guided intra-abdominal fluid collection aspiration with possible drain placement. This case has been reviewed by Dr. Vernard Gambles.    Past Medical History:  Diagnosis Date  . AAA (abdominal aortic aneurysm) ([place])   . AICD (automatic cardioverter/defibrillator) present 11/10/2017  . Arthritis    "some in my knees" (11/10/2017)  . Atrial fibrillation ([place])   . CHF (congestive heart failure) ([place])   . Chronic bronchitis ([place])   . COPD (chronic obstructive pulmonary disease) ([place])   . Depression   . GERD (gastroesophageal reflux disease)   . Gout    "daily RX" (11/10/2017)  . History of hiatal hernia   . Hyperlipidemia   . Hypertension   . Hypothyroid   . Migraine headache    "hx; none since 1980s" (11/10/2017)  . Osteopenia   . Pneumonia    "~ 3 times" (11/10/2017)  . PVD (peripheral vascular disease) ([place])     Past Surgical History:  Procedure Laterality Date  . ABDOMINAL AORTIC ENDOVASCULAR STENT GRAFT N/A 04/11/2013   Procedure: ABDOMINAL AORTIC ENDOVASCULAR STENT GRAFT WITH RIGHT FEMORAL PATCH ANGIOPLASTY;  Surgeon: Mal Misty, MD;  Location: [place];  Service: Vascular;  Laterality: N/A;  . AV NODE ABLATION N/A 09/13/2018   Procedure: AV NODE ABLATION;  Surgeon: Evans Lance, MD;  Location: [facility];  Service: Cardiovascular;  Laterality: N/A;  . BIV ICD INSERTION CRT-D  11/10/2017  . BIV ICD INSERTION CRT-D N/A 11/10/2017   Procedure: BIV ICD INSERTION CRT-D;  Surgeon: Evans Lance, MD;  Location: [facility];  Service: Cardiovascular;  Laterality: N/A;  . BREAST SURGERY     LEFT BREAST BIOPSY  . CARDIAC CATHETERIZATION    . CATARACT EXTRACTION W/ INTRAOCULAR LENS  IMPLANT, BILATERAL Bilateral   . CHOLECYSTECTOMY OPEN  1972  . COLONOSCOPY    . ESOPHAGEAL BRUSHING  09/29/2019   Procedure: ESOPHAGEAL BRUSHING;  Surgeon: Rogene Houston, MD;  [facility];  Service: Endoscopy;;  for candida   . ESOPHAGOGASTRODUODENOSCOPY N/A 09/29/2019   Procedure: ESOPHAGOGASTRODUODENOSCOPY (EGD);  Surgeon: Rogene Houston, MD;  [facility];  Service: Endoscopy;  Laterality:  N/A;  . EYE SURGERY Bilateral    "bleeding in my eyes"  . FRACTURE SURGERY    . TIBIA FRACTURE SURGERY Left 1970s  . TONSILLECTOMY    . VARICOSE VEIN SURGERY Bilateral    "laser"    Allergies: Amiodarone, Diovan [valsartan], Doxycycline, Flexeril [cyclobenzaprine], Keflex [cephalexin], Verapamil, Augmentin [amoxicillin-pot clavulanate], Ciprofloxacin, and Codeine  Medications: Prior to Admission medications   Medication Sig Start Date End Date Taking? Authorizing Provider  acetaminophen (TYLENOL) 500 MG tablet Take 1,000 mg by mouth every 6 (six) hours as needed for mild pain or moderate pain.   Yes [provider]  albuterol (PROAIR HFA) 108 (90 Base) MCG/ACT inhaler Use 2 Inhalations 15 minutes Apart every 4 hours to Rescue Asthma Attack Patient taking differently: Inhale 2 puffs into the lungs every 4 (four) hours as needed for wheezing or shortness of breath (or "asthma attacks").  07/29/18  Yes [name], MD  allopurinol (ZYLOPRIM) 300 MG tablet Take 0.5 tablets (150 mg total) by mouth daily. 09/20/19  Yes Vicie Mutters, PA-C  aspirin EC 81 MG EC tablet Take 1 tablet (81 mg total) by mouth daily with breakfast. Swallow whole. 10/03/19  Yes Emokpae, Courage, MD  budesonide-formoterol (SYMBICORT) 80-4.5 MCG/ACT inhaler Use 2 inhalations 30 minutes apart 2 x /day (every 12 hours) Patient taking differently: Inhale 2 puffs into the lungs daily as needed (for flares).  08/28/19  Yes [name], MD  cetirizine (ZYRTEC) 10 MG tablet Take 10 mg by mouth daily as needed for allergies.    Yes [provider]  ciprofloxacin (CIPRO) 500 MG tablet Take 500 mg by mouth 2 (two) times daily.   Yes [provider]  Eyelid Cleansers (AVENOVA) 0.01 % SOLN Apply 1 application topically daily.  11/28/17  Yes [provider]  famotidine (PEPCID) 40 MG tablet Take 1 tablet Daily to Prevent Heartburn & Indigestion Patient taking differently: Take 40 mg by mouth  daily as needed for heartburn or indigestion.  10/03/19  Yes [name], MD  ipratropium (ATROVENT) 0.06 % nasal spray Use 1 to 2 sprays each Nostril 2 to 3 x / day as needed Patient taking differently: Place 1-2 sprays into both nostrils 3 (three) times daily as needed for rhinitis (or congestion).  02/27/19 02/28/20 Yes [name], MD  ipratropium-albuterol (DUONEB) 0.5-2.5 (3) MG/3ML SOLN Inhale 3 mLs into the lungs every 6 (six) hours as needed (for shortness of breath or wheezing). 02/27/19  Yes [name], MD  L-Lysine 500 MG TABS Take 500 mg by mouth daily as needed (for cold sores).    Yes [provider]  leflunomide (ARAVA) 20 MG tablet Take 10 mg by mouth daily.  06/18/19  Yes [provider]  levothyroxine (SYNTHROID) 125 MCG tablet Take 1 tablet (125 mcg total) by mouth daily before breakfast. 08/18/19  Yes Adhikari, Amrit, MD  montelukast (SINGULAIR) 10 MG tablet TAKE 1 TABLET DAILY FOR ALLERGIES & ASTHMA Patient taking differently: Take 10 mg by mouth daily. for Allergies & Asthma 02/27/19  Yes [name], MD  OXYGEN Inhale 1 L/min into the lungs at bedtime.    Yes [provider]  Polyethyl Glycol-Propyl Glycol (SYSTANE) 0.4-0.3 % SOLN Place 1 drop into both eyes 4 (four) times daily as needed (for dry eyes).    Yes [provider]  potassium chloride SA (KLOR-CON) 20 MEQ tablet Take 20 mEq by mouth daily.    Yes [provider]  pravastatin (PRAVACHOL) 20 MG tablet Take 1 tablet at Bedtime for Cholesterol Patient taking differently: Take 10 mg by mouth every other day.  09/21/19  Yes [name], MD  pregabalin (LYRICA) 25 MG capsule Take 1 capsule 2 to 3 x /day as needed for Neuropathy pain Patient taking differently: Take 25 mg by mouth 3 (three) times daily. Take 25 mg by mouth two to three times a day as needed for neuropathic pain 08/30/19  Yes [name], MD  Probiotic Product ([employer] PROBIOTIC) CAPS Take 1  capsule by mouth daily.   Yes [provider]  Rivaroxaban (XARELTO) 15 MG TABS tablet Take 15 mg by mouth every evening.   Yes [provider]  simethicone (MYLICON) 353 MG chewable tablet Chew 125 mg by mouth every 6 (six) hours as needed for flatulence.   Yes [provider]  torsemide (DEMADEX) 20 MG tablet Take 2 tablets (40 mg total) by mouth daily. Starting on Thursday 10/04/19 --- after repeat blood work is available Patient taking differently: Take 40 mg by mouth 2 (two) times daily.  10/04/19  Yes Emokpae, Courage, MD  traMADol (ULTRAM) 50 MG tablet Take 50 mg by mouth 2 (two) times daily as needed (for pain).   Yes [provider]  amoxicillin-clavulanate (AUGMENTIN) 875-125 MG tablet Take 1 tablet by mouth 2 (two) times daily for 5 days. Patient not taking: Reported on 10/01/2019 10/02/19 10/07/19  Roxan Hockey, MD  metroNIDAZOLE (FLAGYL) 500 MG tablet Take 1 tablet 3 x /day with meals for Infection Patient taking differently: Take 500 mg by mouth with breakfast, with lunch, and with evening meal.  10/04/19   [name], MD     Family History  Problem Relation Age of Onset  . Cirrhosis Mother   . Cancer Mother 110       PANCREAS  . Heart defect Sister   . Breast cancer Sister        age 17  . Heart disease Sister   . Stroke Sister   . Alcohol abuse Father   . Depression Father   . Hypertension Brother   . Hyperlipidemia Son   . Heart disease Daughter     Social History   Socioeconomic History  . Marital status: Married    Spouse name: Not on file  . Number of children: Not on file  . Years of education: Not on file  . Highest education level: Not on file  Occupational History  . Not on file  Tobacco Use  . Smoking status: Former Smoker    Packs/day: 2.00    Years: 54.00    Pack years: 108.00    Types: Cigarettes    Quit date: 08/10/2002    Years since quitting: 17.1  . Smokeless tobacco: Never Used  Vaping Use  .  Vaping Use: Never used  Substance and Sexual Activity  . Alcohol use: Never  . Drug use: Never  . Sexual activity: Not Currently    Birth control/protection: Post-menopausal  Other Topics Concern  . Not on file  Social History Narrative   Lives in [place] with husband   One daughter   Social Determinants of Health  Financial Resource Strain: Low Risk   . Difficulty of Paying Living Expenses: Not hard at all  Food Insecurity: No Food Insecurity  . Worried About Charity fundraiser in the Last Year: Never true  . Ran Out of Food in the Last Year: Never true  Transportation Needs: No Transportation Needs  . Lack of Transportation (Medical): No  . Lack of Transportation (Non-Medical): No  Physical Activity: Inactive  . Days of Exercise per Week: 0 days  . Minutes of Exercise per Session: 0 min  Stress: No Stress Concern Present  . Feeling of Stress : Not at all  Social Connections: Socially Integrated  . Frequency of Communication with Friends and Family: More than three times a week  . Frequency of Social Gatherings with Friends and Family: More than three times a week  . Attends Religious Services: More than 4 times per year  . Active Member of Clubs or Organizations: Yes  . Attends Archivist Meetings: More than 4 times per year  . Marital Status: Married    Review of Systems: A 12 point ROS discussed and pertinent positives are indicated in the HPI above.  All other systems are negative.  Review of Systems  Constitutional: Positive for appetite change.  Respiratory: Negative for cough and shortness of breath.   Cardiovascular: Positive for leg swelling. Negative for chest pain.  Gastrointestinal: Positive for abdominal pain and diarrhea.  Musculoskeletal: Negative for back pain.  Neurological: Negative for headaches.    Vital Signs: BP (!) 141/62 (BP Location: Right Arm)   Pulse 70   Temp 98.4 F (36.9 C) (Oral)   Resp 17   Ht 5\' 3"  (1.6 m)   Wt 178  lb (80.7 kg)   SpO2 98%   BMI 31.53 kg/m   Physical Exam Constitutional:      General: She is not in acute distress. HENT:     Mouth/Throat:     Mouth: Mucous membranes are moist.     Pharynx: Oropharynx is clear.  Cardiovascular:     Pulses: Normal pulses.     Heart sounds: Normal heart sounds.  Pulmonary:     Effort: Pulmonary effort is normal.     Breath sounds: Normal breath sounds.  Abdominal:     General: Bowel sounds are normal.     Palpations: Abdomen is soft.     Tenderness: There is abdominal tenderness.     Comments: LLQ tenderness to palpation   Musculoskeletal:     Right lower leg: Edema present.     Left lower leg: Edema present.     Comments: +2 bilateral lower extremity pitting edema  Skin:    General: Skin is warm and dry.     Comments: Wound to dorsum of right foot. Wrapped in kerlix.   Neurological:     Mental Status: She is alert and oriented to person, place, and time.     Imaging: DG Chest Portable 1 View  Result Date: 10/11/2019 CLINICAL DATA:  79 year old female with history of shortness of breath. EXAM: PORTABLE CHEST 1 VIEW COMPARISON:  Chest x-ray 08/12/2019. FINDINGS: Lung volumes are normal. No acute consolidative airspace disease. No pleural effusions. Scattered areas of interstitial prominence throughout the lungs bilaterally along with widespread peribronchial cuffing, similar to prior examinations. No pneumothorax. Heart size is moderately enlarged. Upper mediastinal contours are within normal limits. Aortic atherosclerosis. Left-sided biventricular pacemaker/AICD with lead tips projecting over the expected location of the right atrium, right ventricle and overlying the  left ventricle via the coronary sinus and coronary veins. IMPRESSION: 1. No radiographic evidence of acute cardiopulmonary disease. 2. Chronic lung changes suggestive of pulmonary fibrosis redemonstrated, as above. 3. Moderate cardiomegaly. 4. Aortic atherosclerosis.  Electronically Signed   By: Vinnie Langton M.D.   On: 09/29/2019 18:19   VAS [place] ABI WITH/WO TBI  Result Date: 09/14/2019 LOWER EXTREMITY DOPPLER STUDY Indications: Recent fall Wound right anterior calf/foot. High Risk Factors: Hypertension, hyperlipidemia, past history of smoking. Other Factors: AFIB.  Vascular Interventions: EVAR with stent graft and right femoral angioplasty                         04/11/2013. Performing Technologist: Alvia Grove RVT  Examination Guidelines: A complete evaluation includes at minimum, Doppler waveform signals and systolic blood pressure reading at the level of bilateral brachial, anterior tibial, and posterior tibial arteries, when vessel segments are accessible. Bilateral testing is considered an integral part of a complete examination. Photoelectric Plethysmograph (PPG) waveforms and toe systolic pressure readings are included as required and additional duplex testing as needed. Limited examinations for reoccurring indications may be performed as noted.  ABI Findings: +---------+------------------+-----+---------+--------+ Right    Rt Pressure (mmHg)IndexWaveform Comment  +---------+------------------+-----+---------+--------+ Brachial 157                                      +---------+------------------+-----+---------+--------+ PTA      179               1.14 triphasic         +---------+------------------+-----+---------+--------+ DP       175               1.11 triphasic         +---------+------------------+-----+---------+--------+ Great Toe126               0.80 Normal            +---------+------------------+-----+---------+--------+ +---------+------------------+-----+---------+-------+ Left     Lt Pressure (mmHg)IndexWaveform Comment +---------+------------------+-----+---------+-------+ Brachial 155                                     +---------+------------------+-----+---------+-------+ PTA      163               1.04  triphasic        +---------+------------------+-----+---------+-------+ DP       167               1.06 triphasic        +---------+------------------+-----+---------+-------+ Great Toe115               0.73 Normal           +---------+------------------+-----+---------+-------+ +-------+-----------+-----------+------------+------------+ ABI/TBIToday's ABIToday's TBIPrevious ABIPrevious TBI +-------+-----------+-----------+------------+------------+ Right  1.14       0.80       1.18        0.84         +-------+-----------+-----------+------------+------------+ Left   1.06       0.73       1.08        0.79         +-------+-----------+-----------+------------+------------+  Summary: Right: Resting right ankle-brachial index is within normal range. No evidence of significant right lower extremity arterial disease. The right toe-brachial index is normal. Left: Resting left ankle-brachial index is within  normal range. No evidence of significant left lower extremity arterial disease. The left toe-brachial index is normal.  *See table(s) above for measurements and observations.  Electronically signed by Servando Snare MD on 09/14/2019 at 11:40:50 AM.    Final    CT Renal Stone Study  Addendum Date: 09/23/2019   ADDENDUM REPORT: 09/14/2019 18:42 ADDENDUM: These results were called by telephone at the time of interpretation on 09/26/2019 at 6:41 pm to provider [facility] , who verbally acknowledged these results. Electronically Signed   By: Fidela Salisbury MD   On: 09/25/2019 18:42   Result Date: 10/12/2019 CLINICAL DATA:  Flank pain, acute renal failure, right flank pain EXAM: CT ABDOMEN AND PELVIS WITHOUT CONTRAST TECHNIQUE: Multidetector CT imaging of the abdomen and pelvis was performed following the standard protocol without IV contrast. COMPARISON:  09/28/2019 FINDINGS: Lower chest: Bibasilar ground-glass pulmonary infiltrates and peripheral reticulation appears slightly  progressive at the left lung base since prior examination may reflect the sequela of subacute atypical infection. Mild global cardiomegaly is present with particular right atrial enlargement. Extensive coronary artery calcifications present. Pacemaker leads are seen within the right ventricle and left ventricular venous outflow. Hepatobiliary: Status post cholecystectomy. Liver is unremarkable. No intra or extrahepatic biliary ductal dilation. Pancreas: Unremarkable Spleen: Unremarkable Adrenals/Urinary Tract: The adrenal glands and kidneys are unremarkable. The bladder is largely decompressed. Stomach/Bowel: Since the prior examination, there has developed moderate free intraperitoneal gas as well as gas dissecting within the mesentery. Additionally, there have developed two distinct pericolonic gas and fluid collections within the left hemipelvis measuring 8.4 x 5.2 by 4.9 cm at axial image # 64/3 and 4.9 x 3.7 x 2.5 cm on axial image # 73/3 and sagittal image # 60/7. The findings are most in keeping with perforated sigmoid diverticulitis with development of at least to pericolonic abscesses. No evidence of obstruction. Stomach, small bowel, are unremarkable. Mild to moderate sigmoid diverticulosis is again identified. 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 Extensive atherosclerotic calcification within the supra celiac and juxtarenal aorta. Infrarenal abdominal aortic endograft is in place extending into the common iliac arteries bilaterally. This is better assessed on prior CT a of 09/28/2019. Extensive atherosclerotic calcification is seen within the lower extremity arterial inflow bilaterally. No pathologic adenopathy within the abdomen and pelvis. Reproductive: Unremarkable Other: There has developed extensive subcutaneous edema within the dependent body wall possibly related to developing anasarca.  Musculoskeletal: No acute bone abnormality. Degenerative changes are seen within the lumbar spine. IMPRESSION: Perforated sigmoid diverticulitis with moderate free intraperitoneal gas and development of to pericolonic abscesses within the pelvis. No evidence of obstruction. Progressive dependent body wall edema may reflect changes of progressive anasarca. Progressive bibasilar pulmonary infiltrates possibly reflecting changes of subacute infection. Aortic Atherosclerosis (ICD10-I70.0). Electronically Signed: By: Fidela Salisbury MD On: 09/27/2019 18:36   DG HIP UNILAT W OR W/O PELVIS 2-3 VIEWS LEFT  Result Date: 09/21/2019 CLINICAL DATA:  Left hip pain for 3 months EXAM: DG HIP (WITH OR WITHOUT PELVIS) 2-3V LEFT COMPARISON:  Pelvic radiographs dated 08/13/2019 FINDINGS: There is no evidence of hip fracture or dislocation. Moderate degenerative changes are seen in the left hip and the spine. Bilateral iliac stents are noted. IMPRESSION: Moderate degenerative changes in the left hip and spine. Electronically Signed   By: Zerita Boers M.D.   On: 09/21/2019 10:50   CT Angio Abd/Pel W and/or Wo Contrast  Result Date: 09/28/2019 CLINICAL DATA:  Bilateral lower abdominal pain for several days, anemia EXAM: CTA ABDOMEN AND PELVIS WITHOUT AND WITH CONTRAST TECHNIQUE: Multidetector CT imaging of the abdomen and pelvis was performed using the standard protocol during bolus administration of intravenous contrast. Multiplanar reconstructed images and MIPs were obtained and reviewed to evaluate the vascular anatomy. CONTRAST:  161mL OMNIPAQUE IOHEXOL 350 MG/ML SOLN COMPARISON:  04/22/2015 FINDINGS: VASCULAR Aorta: There is fusiform dilatation of the distal thoracic aorta measuring up to 3.7 cm in diameter. There is evidence of prior endoluminal stent graft repair of the abdominal aorta, with no evidence of recurrence or residual aneurysm. There is diffuse atherosclerosis without flow limiting stenosis. No evidence of  dissection. Celiac: Patent without evidence of aneurysm, dissection, vasculitis or significant stenosis. SMA: Patent without evidence of aneurysm, dissection, vasculitis or significant stenosis. Renals: Both renal arteries are patent without evidence of aneurysm, dissection, vasculitis, fibromuscular dysplasia or significant stenosis. IMA: The IMA is not identified, likely secondary to occlusion after endoluminal stent graft repair. Inflow: Moderate atherosclerosis of the bilateral internal and external iliac arteries. There is approximately 50% stenosis of the distal left common iliac artery just beyond the left iliac limb of the stent graft and just proximal to the left iliac bifurcation. No significant stenosis on the right. Proximal Outflow: Bilateral common femoral and visualized portions of the superficial and profunda femoral arteries are patent without evidence of aneurysm, dissection, vasculitis or significant stenosis. Veins: No obvious venous abnormality within the limitations of this arterial phase study. Review of the MIP images confirms the above findings. NON-VASCULAR Lower chest: Emphysema and bibasilar scarring. No acute pleural or parenchymal lung disease. The heart is enlarged, with marked right atrial dilatation. Hepatobiliary: No focal liver abnormality is seen. Status post cholecystectomy. No biliary dilatation. Pancreas: Unremarkable. No pancreatic ductal dilatation or surrounding inflammatory changes. Spleen: Normal in size without focal abnormality. Adrenals/Urinary Tract: Adrenal glands are unremarkable. Kidneys are normal, without renal calculi, focal lesion, or hydronephrosis. There is wall thickening along the left superior aspect of the bladder, likely due to adjacent inflammatory changes involving the sigmoid colon. Stomach/Bowel: No bowel obstruction or ileus. There is diverticulosis of the sigmoid colon, with wall thickening and pericolonic fat stranding involving the proximal  sigmoid colon consistent with acute uncomplicated diverticulitis. There is no intraluminal contrast accumulation to suggest active gastrointestinal bleeding. Lymphatic: No pathologic adenopathy within the abdomen or pelvis. Reproductive: Uterus and bilateral adnexa are unremarkable. Other: There is fat stranding within the left lower quadrant most likely representing acute sigmoid diverticulitis. No free fluid or free gas. Musculoskeletal: No acute or destructive bony lesions. Reconstructed images demonstrate no additional findings. IMPRESSION: VASCULAR 1. No evidence of active gastrointestinal bleeding. 2. 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 3. Postsurgical changes from endoluminal stent graft repair of the abdominal aorta. No evidence of recurrence or residual aneurysm. No complication. 4. Diffuse atherosclerosis. There is focal stenosis in the distal left common iliac artery just proximal to the bifurcation approaching 50%. No other critical stenoses are identified. NON-VASCULAR 1. Findings most consistent with acute sigmoid diverticulitis. No evidence of perforation, fluid collection, or abscess. Close follow-up is recommended to ensure resolution after appropriate medical management, and exclude underlying neoplasm. 2. Inflammatory changes within the left lower quadrant mesentery related to the diverticulitis described above. There is secondary inflammatory change along the left superior aspect of the bladder. 3. Aortic Atherosclerosis (ICD10-I70.0) and Emphysema (ICD10-J43.9). Electronically Signed   By: Randa Ngo M.D.   On: 09/28/2019 22:07  Labs:  CBC: Recent Labs    10/02/19 0812 10/05/19 1134 09/13/2019 1042 10/07/19 0151  WBC 10.5 11.4* 13.3* 11.3*  HGB 8.3* 9.0* 8.5* 7.9*  HCT 26.0* 28.8* 27.7* 24.7*  PLT 188 293 328 298    COAGS: Recent Labs    09/28/19 1325 09/29/19 0616  INR 1.5* 1.2  APTT  --  36    BMP: Recent Labs     10/02/19 0812 10/05/19 1134 09/17/2019 1042 10/07/19 0151  NA 136 132* 130* 132*  K 3.7 4.6 4.9 5.3*  CL 102 96* 96* 99  CO2 23 20 17* 20*  GLUCOSE 96 76 88 72  BUN 31* 45* 47* 49*  CALCIUM 7.9* 8.8 8.3* 7.9*  CREATININE 1.68* 2.83* 2.96* 2.75*  GFRNONAA 29* 15* 15* 16*  GFRAA 34* 18* 17* 19*    LIVER FUNCTION TESTS: Recent Labs    08/12/19 1349 08/12/19 1349 08/13/19 0654 08/27/19 1132 09/26/19 1423 09/29/19 0616 10/05/19 1134 09/18/2019 1042  BILITOT 2.5*   < > 2.7*   < > 0.7 1.4* 0.7 0.8  AST 27   < > 24   < > 12 14* 14 18  ALT 19   < > 17   < > 17 17 14 18   ALKPHOS 59  --  58  --   --  31*  --  69  PROT 6.8   < > 5.1*   < > 5.1* 5.3* 5.6* 5.5*  ALBUMIN 3.2*  --  2.3*  --   --  2.7*  --  2.1*   < > = values in this interval not displayed.    Assessment and Plan:  Perforated sigmoid diverticulitis with fluid collections in the left abdomen: Katie [name], 79 year old female, is scheduled today for an image-guided abdominal fluid collection aspiration with possible drain placement. She was admitted to the hospital 10/09/2019 for acute kidney injury and abdominal pain and was found to have a perforated sigmoid diverticulitis.   Risks and benefits discussed with the patient, her daughter and granddaughter, including bleeding, infection, damage to adjacent structures, bowel perforation/fistula connection, and sepsis.  All of the patient's and her family's questions were answered, patient is agreeable to proceed.  She has been NPO. She does take Xarelto for her atrial fibrillation, last dose 10/04/19. Labs and vitals have been reviewed.   Consent signed and in chart.  Thank you for this interesting consult.  I greatly enjoyed meeting Katie [name] and look forward to participating in their care.  A copy of this report was sent to the requesting provider on this date.  Electronically Signed: Soyla Dryer, AGACNP-BC [phone] 10/07/2019, 8:16 AM   I spent a total of 40  Minutes    in face to face in clinical consultation, greater than 50% of which was counseling/coordinating care for intraabdominal fluid collection aspiration with possible drain placement.

## 2019-10-07 NOTE — Progress Notes (Signed)
    CC: Abdominal pain with Hx GI bleed  Subjective: She is amazingly pain-free considering the size of her fluid collections.  Her only pain is in her back.  She says she has had problem with her kidneys for about 2 years and reported that she is "level 3."  Objective: Vital signs in last 24 hours: Temp:  [97.5 F (36.4 C)-98.9 F (37.2 C)] 98.4 F (36.9 C) (07/25 0420) Pulse Rate:  [70-71] 70 (07/25 0420) Resp:  [16-18] 17 (07/25 0420) BP: (126-144)/(57-67) 141/62 (07/25 0420) SpO2:  [97 %-98 %] 98 % (07/25 0420) Weight:  [80.7 kg] 80.7 kg (07/24 1029) Last BM Date: 10/05/19 Nothing recorded in the I&O Afebrile vital signs are stable Sodium 132, potassium 5.3, CO2 20, BUN 49, creatinine 2.75, WBC 11.3 Hemoglobin 7.9, hematocrit 24.7, platelets 298K UA shows 21-50 white cells per high-powered field CT without contrast 7/24: Moderate free intraperitoneal air as well as gas dissecting within the mesentery.  2 distinct pericolonic gas fluid collections within the left hemipelvis measuring eight 8.4 x 5.2 x 4.9 cm, and 4.9 x 3.7 x 2.5 cm.  Consistent with perforated sigmoid diverticulitis  IR drain evaluation pending.   Intake/Output from previous day: No intake/output data recorded. Intake/Output this shift: No intake/output data recorded.  General appearance: alert, cooperative and no distress Resp: clear to auscultation bilaterally GI: soft, non-tender; bowel sounds normal; no masses,  no organomegaly and Her only pain is in her back  Lab Results:  Recent Labs    09/15/2019 1042 10/07/19 0151  WBC 13.3* 11.3*  HGB 8.5* 7.9*  HCT 27.7* 24.7*  PLT 328 298    BMET Recent Labs    09/14/2019 1042 10/07/19 0151  NA 130* 132*  K 4.9 5.3*  CL 96* 99  CO2 17* 20*  GLUCOSE 88 72  BUN 47* 49*  CREATININE 2.96* 2.75*  CALCIUM 8.3* 7.9*   PT/INR No results for input(s): LABPROT, INR in the last 72 hours.  Recent Labs  Lab 10/05/19 1134 09/20/2019 1042  AST 14 18   ALT 14 18  ALKPHOS  --  69  BILITOT 0.7 0.8  PROT 5.6* 5.5*  ALBUMIN  --  2.1*     Lipase     Component Value Date/Time   LIPASE 33 05/23/2018 0203     Medications: . fluticasone furoate-vilanterol  1 puff Inhalation Daily  . levothyroxine  125 mcg Oral Q0600  . montelukast  10 mg Oral Daily  . pantoprazole (PROTONIX) IV  40 mg Intravenous Q24H  . pravastatin  10 mg Oral QODAY   . sodium chloride 75 mL/hr at 10/07/19 0543  . piperacillin-tazobactam 3.375 g (10/07/19 0505)   Assessment/Plan Atrial fibrillation -on Xarelto-last dose 10/04/2019 Acute on chronic CKD ?UTI - urine culture pending CHF AICD Abdominal aortic aneurysm Peripheral vascular disease COPD Hypertension Hypothyroid Hyperlipidemia GERD  Perforated sigmoid diverticulitis with 2 intra-abdominal abscess  FEN: IV fluids/n.p.o. ID: Zosyn 7/24 >> day 2 DVT: SCDs Follow-up: TBD    Plan: Request for IR percutaneous drainage.  Continue antibiotics.  IV fluid hydration.  She can have DVT prophylaxis when cleared by IR, from our standpoint.    LOS: 1 day    Jorita Bohanon 10/07/2019 Please see Amion

## 2019-10-07 NOTE — Progress Notes (Addendum)
PROGRESS NOTE    Katie [name]  PTW:[id number] DOB: Jan 10, 1941 DOA: 10/12/2019 PCP: [name], MD     Brief Narrative:  79 y.o. WF PMHx HTN, HLD, chronic A-fib on Xarelto, CHF with a BiVent AICD, severe pulmonary HTN, AAA CKD stage III , Hypothyroidism, Gout and Rheumatoid Arthritis  Presented to ED after AKI found by PCP and found to have perforated sigmoid diverticulitis with air into the mesentery, some moderate free air and 2 intra-abdominal abscesses.  Patient presents after being sent by PCP for AKI found on labs. She was recently admitted earlier this month for lower GI bleed and found to have diverticulitis and started on antibiotics. Reports diarrhea for several weeks with about 2-3 loose stools daily. Denies water stools. She stopped taking antibiotics about 4-5 days ago due to side effects. For the past month, reports right flank pain, suprapubic abdominal pain and generalized abdominal pain. Reports pain has not necessarily worsened but has persisted. She has not noticed a pattern of when pain is worse but pain is intermittent. She has continued to have some blood in her stools. Reports minimal appetite for several weeks and last ate 2 days ago. Last took Xarelto two days ago. She did vomit once  yesterday and today. Reports some "tingling" with urination. Denies headache, dizziness, fever, chills, cough, SOB, chest pain, constipation, dysuria, hematuria, melena, difficulty moving arms/legs, speech difficulty, confusion or any other complaints.  In the ED: Vitals stable on room air. Labs remarkable for Na 130, Cr 2.96, CO2 17, AG 17, WBC 13.3, Hgb 8.5, UA with moderate leuks.  CXR: non-acute CT Renal Study with perforated sigmoid diverticulitis with air into the mesentery, some moderate free air and 2 intra-abdominal abscesses.  Patient was started on Zosyn and given 1L IVF. General Surgery consulted by EDP and saw patient. Admission by hospitalist  requested.    Subjective: A/O x4, states after surgery abdominal pain significantly decreased.  Negative nausea.   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 (baseline  Cr 1.4) -Hold all nephrotoxic medication -Hold furosemide, Lyrica, allopurinol -Monitor closely Recent Labs  Lab 10/01/19 0607 10/02/19 0812 10/05/19 1134 10/12/2019 1042 10/07/19 0151  CREATININE 1.60* 1.68* 2.83* 2.96* 2.75*    Severe pulmonary HTN -Previous echocardiogram May 2021 PASP 76.5 mmHg -Strict ins and outs -Daily weight  History of atherosclerosis of native coronary artery of native heart/Atrial fibrillation - CHA2Ds2-VASc score= 5 - Xarelto will be held at this time due to GI bleed and  pre-operative state -Patient not on rate control agent -Currently NSR -We will  restart Xarelto on 7/26  Hyponatremia -Most likely secondary to CHF, and renal failure - Na 130 on admission; previously 136 on 720 - Urine studies ordered - s/p 1 L NS in ED; hold Torsemide - Trend  -Continue normal saline 68ml/hr  Hyperlipidemia -Pravastatin 10 mg daily -Lipid panel pending   Hypothyroidism -Synthroid 125 mcg daily   Gout -- holding home Allopurinol and Lyrica due to AK  Neuropathic pain -See gout  Bilateral lower extremity wounds -Normally obtains outpatient wound care -7/25 request wound care consult;Patient with bilateral lower extremity wounds evaluate and dress  Hyperkalemia -Lokelma 10 g x 1   DVT prophylaxis: SCD Code Status: Full Family Communication: 7/25 spoke with daughter discussed plan of care answered all questions Status is: Inpatient    Dispo: The patient is from: Home              Anticipated d/c is to: Home              Anticipated d/c date is: 8/2              Patient currently unstable      Consultants:  [facility] IR    Procedures/Significant Events:  5/31 Echocardiogram:Left Ventricle: LVEF=60 to 65%. left ventricle has no regional wall motion abnormalities.-moderate LVH   Right Ventricle:-Severely elevated PASP 76.5 mmHg.  - A pacer wire is visualized.  ------------------------------------------------------------------------------------------------------------------------------------- 7/25 s/p CT abscess drain placement RLQ 61f   I have personally reviewed and interpreted all radiology studies and my findings are as above.  VENTILATOR SETTINGS:    Cultures   Antimicrobials: Anti-infectives (From admission, onward)   Start     Ordered Stop   10/07/19 1400  piperacillin-tazobactam (ZOSYN) IVPB 2.25 g     Discontinue     10/07/19 1137     09/13/2019 2230  piperacillin-tazobactam (ZOSYN) IVPB 3.375 g  Status:  Discontinued        09/16/2019 2212 10/07/19 1137   09/17/2019 1900  piperacillin-tazobactam (ZOSYN)  IVPB 3.375 g  Status:  Discontinued        09/30/2019 1846 09/22/2019 2218       Devices    LINES / TUBES:      Continuous Infusions: . sodium chloride 75 mL/hr at 10/07/19 0543  . piperacillin-tazobactam 3.375 g (10/07/19 0505)     Objective: Vitals:   10/05/2019 2032 09/13/2019 2215 10/07/19 0125 10/07/19 0420  BP: (!) 141/58 (!) 144/63 (!) 142/60 (!) 141/62  Pulse: 71 70 71 70  Resp: 18 17 18 17   Temp:  98.9 F (37.2 C) 98.6 F (37 C) 98.4 F (36.9 C)  TempSrc:  Oral Oral Oral  SpO2: 97% 97% 98% 98%  Weight:      Height:        Intake/Output Summary (Last 24 hours) at 10/07/2019 0810 Last data filed at 10/07/2019 0505 Gross per 24 hour  Intake 0 ml  Output --  Net 0 ml   Filed Weights   09/16/2019 1029  Weight: 80.7 kg    Examination:  General: A/O x4, No acute respiratory distress Eyes: negative scleral hemorrhage, negative anisocoria, negative icterus ENT: Negative Runny nose, negative gingival bleeding, Neck:  Negative scars, masses, torticollis, lymphadenopathy, JVD Lungs: Clear to auscultation bilaterally without wheezes or crackles Cardiovascular: Regular rate and rhythm without murmur gallop or rub normal S1 and S2 Abdomen: OBESE abdominal pain, nondistended, positive soft, bowel sounds, no rebound, no ascites,  no appreciable mass, drain in place Extremities: No significant cyanosis, clubbing, or edema bilateral lower extremities Skin: Negative rashes, lesions, ulcers Psychiatric:  Negative depression, negative anxiety, negative fatigue, negative mania  Central nervous system:  Cranial nerves II through XII intact, tongue/uvula midline, all extremities muscle strength 5/5, sensation intact throughout, negative dysarthria, negative expressive aphasia, negative receptive aphasia.  .     Data Reviewed: Care during the described time interval was provided by me .  I have reviewed this patient's available data, including medical history, events of note,  physical examination, and all test results as part of my evaluation.  CBC: Recent Labs  Lab 10/01/19 0607 10/02/19 0812 10/05/19 1134 09/14/2019 1042 10/07/19 0151  WBC 13.3* 10.5 11.4* 13.3* 11.3*  NEUTROABS  --   --  9,941*  --   --   HGB 8.6* 8.3* 9.0* 8.5* 7.9*  HCT 27.3* 26.0* 28.8* 27.7* 24.7*  MCV 92.2 91.2 87.3 88.5 86.4  PLT 229 188 293 328 947   Basic Metabolic Panel: Recent Labs  Lab 10/01/19 0607 10/02/19 0812 10/05/19 1134 09/20/2019 1042 09/25/2019 2231 10/07/19 0151  NA 137 136 132* 130*  --  132*  K 4.0 3.7 4.6 4.9  --  5.3*  CL 100 102 96* 96*  --  99  CO2 25 23 20  17*  --  20*  GLUCOSE 109* 96 76 88  --  72  BUN 31* 31* 45* 47*  --  49*  CREATININE 1.60* 1.68* 2.83* 2.96*  --  2.75*  CALCIUM 8.0* 7.9* 8.8 8.3*  --  7.9*  MG  --   --   --   --  2.4 2.4  PHOS  --   --   --   --  6.2*  --    GFR: Estimated Creatinine Clearance: 17.2 mL/min (A) (by C-G formula based on SCr of 2.75 mg/dL (H)). Liver Function Tests: Recent Labs  Lab 10/05/19 1134 09/29/2019 1042  AST 14 18  ALT 14 18  ALKPHOS  --  69  BILITOT 0.7 0.8  PROT 5.6* 5.5*  ALBUMIN  --  2.1*   No results for input(s): LIPASE, AMYLASE in the last 168 hours. No results for input(s): AMMONIA in the last 168 hours. Coagulation Profile: No results for input(s): INR, PROTIME in the last 168 hours. Cardiac Enzymes: No results for input(s): CKTOTAL, CKMB, CKMBINDEX, TROPONINI in the last 168 hours. BNP (last 3 results) No results for input(s): PROBNP in the last 8760 hours. HbA1C: No results for input(s): HGBA1C in the last 72 hours. CBG: No results for input(s): GLUCAP in the last 168 hours. Lipid Profile: No results for input(s): CHOL, HDL, LDLCALC, TRIG, CHOLHDL, LDLDIRECT in the last 72 hours. Thyroid Function Tests: No results for input(s): TSH, T4TOTAL, FREET4, T3FREE, THYROIDAB in the last 72 hours. Anemia Panel: No results for input(s): VITAMINB12, FOLATE, FERRITIN, TIBC, IRON,  RETICCTPCT in the last 72 hours. Sepsis Labs: Recent Labs  Lab 09/16/2019 2041 10/12/2019 2231  LATICACIDVEN 1.8 1.9    Recent Results (from the past 240 hour(s))  SARS Coronavirus 2 by RT PCR (hospital order, performed in [facility] hospital lab) Nasopharyngeal Nasopharyngeal Swab     Status: None   Collection Time: 09/28/19 11:30 PM   Specimen: Nasopharyngeal Swab  Result Value Ref Range Status   SARS Coronavirus 2 NEGATIVE NEGATIVE Final    Comment: (NOTE) SARS-CoV-2 target nucleic acids are NOT DETECTED.  The SARS-CoV-2 RNA is generally detectable in upper and lower  respiratory specimens during the acute phase of infection. The lowest concentration of SARS-CoV-2 viral copies this assay can detect is 250 copies / mL. A negative result does not preclude SARS-CoV-2 infection and should not be used as the sole basis for treatment or other patient management decisions.  A negative result may occur with improper specimen collection / handling, submission of specimen other than nasopharyngeal swab, presence of viral mutation(s) within the areas targeted by this assay, and inadequate number of viral copies (<250 copies / mL). A negative result must be combined with clinical observations, patient history, and epidemiological information.  Fact Sheet for Patients:   StrictlyIdeas.no  Fact Sheet for Healthcare Providers: BankingDealers.co.za  This test is not yet approved or  cleared by the [place] FDA and has been authorized for detection and/or diagnosis of SARS-CoV-2 by FDA under an Emergency Use Authorization (EUA).  This EUA will remain in effect (meaning this test can be used) for the duration of the COVID-19 declaration under Section 564(b)(1) of the Act, 21 U.S.C. section 360bbb-3(b)(1), unless the authorization is terminated or revoked sooner.  Performed at [facility], [address], [place], [place] [address]   Urine  Culture     Status: Abnormal   Collection Time: 09/29/19  1:13 AM   Specimen: Urine, Clean Catch  Result Value Ref Range Status   Specimen Description   Final    URINE, CLEAN CATCH Performed at [facility], [address] [place], [place] [address]    Special Requests   Final    NONE Performed at [facility], [address] [place], [place] [address]    Culture (A)  Final    <10,000 COLONIES/mL INSIGNIFICANT GROWTH Performed at [facility], [place] [address] [place], [place] [address]    Report Status 09/30/2019 FINAL  Final  Culture, blood (Routine X 2) w Reflex to ID Panel     Status: None   Collection Time: 09/29/19  1:36 AM   Specimen: Left Antecubital; Blood  Result Value Ref Range Status   Specimen Description LEFT ANTECUBITAL  Final   Special Requests   Final    BOTTLES DRAWN AEROBIC AND ANAEROBIC Blood Culture adequate volume   Culture   Final    NO GROWTH 5 DAYS Performed at [facility], [address], [place], [place] [address]    Report Status 10/04/2019 FINAL  Final  Culture, blood (Routine X 2) w Reflex to ID Panel     Status: None   Collection Time: 09/29/19  6:16 AM   Specimen: BLOOD LEFT WRIST  Result Value Ref Range Status   Specimen Description BLOOD LEFT WRIST BOTTLES DRAWN AEROBIC ONLY  Final   Special Requests Blood Culture adequate volume  Final   Culture   Final    NO GROWTH 5 DAYS Performed at [facility], [address] [place], [place] [address]    Report Status 10/04/2019 FINAL  Final  KOH prep     Status: None   Collection Time: 09/29/19 12:33 PM   Specimen: Bronchial Brush  Result Value Ref Range Status   Specimen Description BRONCHIAL BRUSHING  Final   Special Requests NONE  Final   KOH Prep   Final    FEW YEAST Performed at [facility], [address], [place], [place] [address]    Report Status 09/29/2019 FINAL  Final  SARS Coronavirus 2 by RT PCR (hospital order, performed in [facility] hospital lab) Nasopharyngeal  Nasopharyngeal Swab  Status: None   Collection Time: 09/16/2019  8:41 PM   Specimen: Nasopharyngeal Swab  Result Value Ref Range Status   SARS Coronavirus 2 NEGATIVE NEGATIVE Final    Comment: (NOTE) SARS-CoV-2 target nucleic acids are NOT DETECTED.  The SARS-CoV-2 RNA is generally detectable in upper and lower respiratory specimens during the acute phase of infection. The lowest concentration of SARS-CoV-2 viral copies this assay can detect is 250 copies / mL. A negative result does not preclude SARS-CoV-2 infection and should not be used as the sole basis for treatment or other patient management decisions.  A negative result may occur with improper specimen collection / handling, submission of specimen other than nasopharyngeal swab, presence of viral mutation(s) within the areas targeted by this assay, and inadequate number of viral copies (<250 copies / mL). A negative result must be combined with clinical observations, patient history, and epidemiological information.  Fact Sheet for Patients:   StrictlyIdeas.no  Fact Sheet for Healthcare Providers: BankingDealers.co.za  This test is not yet approved or  cleared by the [place] FDA and has been authorized for detection and/or diagnosis of SARS-CoV-2 by FDA under an Emergency Use Authorization (EUA).  This EUA will remain in effect (meaning this test can be used) for the duration of the COVID-19 declaration under Section 564(b)(1) of the Act, 21 U.S.C. section 360bbb-3(b)(1), unless the authorization is terminated or revoked sooner.  Performed at [facility], [place] [address], [place], [place] [address]          Radiology Studies: DG Chest Portable 1 View  Result Date: 10/03/2019 CLINICAL DATA:  79 year old female with history of shortness of breath. EXAM: PORTABLE CHEST 1 VIEW COMPARISON:  Chest x-ray 08/12/2019. FINDINGS: Lung volumes are normal. No acute  consolidative airspace disease. No pleural effusions. Scattered areas of interstitial prominence throughout the lungs bilaterally along with widespread peribronchial cuffing, similar to prior examinations. No pneumothorax. Heart size is moderately enlarged. Upper mediastinal contours are within normal limits. Aortic atherosclerosis. Left-sided biventricular pacemaker/AICD with lead tips projecting over the expected location of the right atrium, right ventricle and overlying the left ventricle via the coronary sinus and coronary veins. IMPRESSION: 1. No radiographic evidence of acute cardiopulmonary disease. 2. Chronic lung changes suggestive of pulmonary fibrosis redemonstrated, as above. 3. Moderate cardiomegaly. 4. Aortic atherosclerosis. Electronically Signed   By: Vinnie Langton M.D.   On: 10/08/2019 18:19   CT Renal Stone Study  Addendum Date: 09/28/2019   ADDENDUM REPORT: 10/07/2019 18:42 ADDENDUM: These results were called by telephone at the time of interpretation on 09/28/2019 at 6:41 pm to provider [facility] , who verbally acknowledged these results. Electronically Signed   By: Fidela Salisbury MD   On: 09/20/2019 18:42   Result Date: 10/09/2019 CLINICAL DATA:  Flank pain, acute renal failure, right flank pain EXAM: CT ABDOMEN AND PELVIS WITHOUT CONTRAST TECHNIQUE: Multidetector CT imaging of the abdomen and pelvis was performed following the standard protocol without IV contrast. COMPARISON:  09/28/2019 FINDINGS: Lower chest: Bibasilar ground-glass pulmonary infiltrates and peripheral reticulation appears slightly progressive at the left lung base since prior examination may reflect the sequela of subacute atypical infection. Mild global cardiomegaly is present with particular right atrial enlargement. Extensive coronary artery calcifications present. Pacemaker leads are seen within the right ventricle and left ventricular venous outflow. Hepatobiliary: Status post cholecystectomy. Liver is  unremarkable. No intra or extrahepatic biliary ductal dilation. Pancreas: Unremarkable Spleen: Unremarkable Adrenals/Urinary Tract: The adrenal glands and kidneys are unremarkable. The bladder  is largely decompressed. Stomach/Bowel: Since the prior examination, there has developed moderate free intraperitoneal gas as well as gas dissecting within the mesentery. Additionally, there have developed two distinct pericolonic gas and fluid collections within the left hemipelvis measuring 8.4 x 5.2 by 4.9 cm at axial image # 64/3 and 4.9 x 3.7 x 2.5 cm on axial image # 73/3 and sagittal image # 60/7. The findings are most in keeping with perforated sigmoid diverticulitis with development of at least to pericolonic abscesses. No evidence of obstruction. Stomach, small bowel, are unremarkable. Mild to moderate sigmoid diverticulosis is again identified. 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 Extensive atherosclerotic calcification within the supra celiac and juxtarenal aorta. Infrarenal abdominal aortic endograft is in place extending into the common iliac arteries bilaterally. This is better assessed on prior CT a of 09/28/2019. Extensive atherosclerotic calcification is seen within the lower extremity arterial inflow bilaterally. No pathologic adenopathy within the abdomen and pelvis. Reproductive: Unremarkable Other: There has developed extensive subcutaneous edema within the dependent body wall possibly related to developing anasarca. Musculoskeletal: No acute bone abnormality. Degenerative changes are seen within the lumbar spine. IMPRESSION: Perforated sigmoid diverticulitis with moderate free intraperitoneal gas and development of to pericolonic abscesses within the pelvis. No evidence of obstruction. Progressive dependent body wall edema may reflect changes of progressive anasarca. Progressive bibasilar pulmonary  infiltrates possibly reflecting changes of subacute infection. Aortic Atherosclerosis (ICD10-I70.0). Electronically Signed: By: Fidela Salisbury MD On: 09/25/2019 18:36        Scheduled Meds: . fluticasone furoate-vilanterol  1 puff Inhalation Daily  . levothyroxine  125 mcg Oral Q0600  . montelukast  10 mg Oral Daily  . pantoprazole (PROTONIX) IV  40 mg Intravenous Q24H  . pravastatin  10 mg Oral QODAY   Continuous Infusions: . sodium chloride 75 mL/hr at 10/07/19 0543  . piperacillin-tazobactam 3.375 g (10/07/19 0505)     LOS: 1 day    Time spent:40 min    Emmett Arntz, Geraldo Docker, MD [facility] Pager [phone]  If 7PM-7AM, please contact night-coverage [email or web address] Password [email or web address] 10/07/2019, 8:10 AM

## 2019-10-07 NOTE — Progress Notes (Signed)
Gravity drain flushed with 76mL NS. Patient and patient's daughter taught how to flush drain should she be discharged home with drain.

## 2019-10-08 ENCOUNTER — Ambulatory Visit: Payer: Medicare Other | Admitting: Physician Assistant

## 2019-10-08 ENCOUNTER — Encounter (HOSPITAL_COMMUNITY): Payer: Self-pay | Admitting: Family Medicine

## 2019-10-08 LAB — PHOSPHORUS: Phosphorus: 5.3 mg/dL — ABNORMAL HIGH (ref 2.5–4.6)

## 2019-10-08 LAB — UREA NITROGEN, URINE: Urea Nitrogen, Ur: 450 mg/dL

## 2019-10-08 LAB — CBC WITH DIFFERENTIAL/PLATELET
Abs Immature Granulocytes: 0.11 10*3/uL — ABNORMAL HIGH (ref 0.00–0.07)
Basophils Absolute: 0 10*3/uL (ref 0.0–0.1)
Basophils Relative: 0 %
Eosinophils Absolute: 0.1 10*3/uL (ref 0.0–0.5)
Eosinophils Relative: 1 %
HCT: 26.6 % — ABNORMAL LOW (ref 36.0–46.0)
Hemoglobin: 8.2 g/dL — ABNORMAL LOW (ref 12.0–15.0)
Immature Granulocytes: 1 %
Lymphocytes Relative: 11 %
Lymphs Abs: 0.9 10*3/uL (ref 0.7–4.0)
MCH: 26.8 pg (ref 26.0–34.0)
MCHC: 30.8 g/dL (ref 30.0–36.0)
MCV: 86.9 fL (ref 80.0–100.0)
Monocytes Absolute: 0.6 10*3/uL (ref 0.1–1.0)
Monocytes Relative: 7 %
Neutro Abs: 6.6 10*3/uL (ref 1.7–7.7)
Neutrophils Relative %: 80 %
Platelets: 290 10*3/uL (ref 150–400)
RBC: 3.06 MIL/uL — ABNORMAL LOW (ref 3.87–5.11)
RDW: 19.5 % — ABNORMAL HIGH (ref 11.5–15.5)
WBC: 8.3 10*3/uL (ref 4.0–10.5)
nRBC: 1.3 % — ABNORMAL HIGH (ref 0.0–0.2)

## 2019-10-08 LAB — MAGNESIUM: Magnesium: 2.5 mg/dL — ABNORMAL HIGH (ref 1.7–2.4)

## 2019-10-08 LAB — COMPREHENSIVE METABOLIC PANEL
ALT: 14 U/L (ref 0–44)
AST: 19 U/L (ref 15–41)
Albumin: 1.9 g/dL — ABNORMAL LOW (ref 3.5–5.0)
Alkaline Phosphatase: 56 U/L (ref 38–126)
Anion gap: 14 (ref 5–15)
BUN: 45 mg/dL — ABNORMAL HIGH (ref 8–23)
CO2: 16 mmol/L — ABNORMAL LOW (ref 22–32)
Calcium: 7.4 mg/dL — ABNORMAL LOW (ref 8.9–10.3)
Chloride: 106 mmol/L (ref 98–111)
Creatinine, Ser: 2.32 mg/dL — ABNORMAL HIGH (ref 0.44–1.00)
GFR calc Af Amer: 23 mL/min — ABNORMAL LOW (ref >60)
GFR calc non Af Amer: 20 mL/min — ABNORMAL LOW (ref >60)
Glucose, Bld: 78 mg/dL (ref 70–99)
Potassium: 4 mmol/L (ref 3.5–5.1)
Sodium: 136 mmol/L (ref 135–145)
Total Bilirubin: 0.7 mg/dL (ref 0.3–1.2)
Total Protein: 5 g/dL — ABNORMAL LOW (ref 6.5–8.1)

## 2019-10-08 LAB — LIPID PANEL
Cholesterol: 65 mg/dL (ref 0–200)
HDL: 13 mg/dL — ABNORMAL LOW (ref >40)
LDL Cholesterol: 30 mg/dL (ref 0–99)
Total CHOL/HDL Ratio: 5 RATIO
Triglycerides: 111 mg/dL (ref <150)
VLDL: 22 mg/dL (ref 0–40)

## 2019-10-08 MED ORDER — SACCHAROMYCES BOULARDII 250 MG PO CAPS
250.0000 mg | ORAL_CAPSULE | Freq: Two times a day (BID) | ORAL | Status: DC
  Administered 2019-10-08 (×2): 250 mg via ORAL
  Filled 2019-10-08 (×3): qty 1

## 2019-10-08 MED ORDER — METHOCARBAMOL 1000 MG/10ML IJ SOLN
500.0000 mg | Freq: Three times a day (TID) | INTRAVENOUS | Status: DC
  Administered 2019-10-08: 500 mg via INTRAVENOUS
  Filled 2019-10-08 (×4): qty 5

## 2019-10-08 MED ORDER — SODIUM BICARBONATE-DEXTROSE 150-5 MEQ/L-% IV SOLN
150.0000 meq | INTRAVENOUS | Status: DC
  Administered 2019-10-08: 150 meq via INTRAVENOUS
  Filled 2019-10-08: qty 1000
  Filled 2019-10-08: qty 150

## 2019-10-08 MED ORDER — FAMOTIDINE 20 MG PO TABS
20.0000 mg | ORAL_TABLET | Freq: Every day | ORAL | Status: DC
  Administered 2019-10-08: 20 mg via ORAL
  Filled 2019-10-08: qty 1

## 2019-10-08 MED ORDER — SODIUM BICARBONATE-DEXTROSE 150-5 MEQ/L-% IV SOLN: 150.0000 meq | INTRAVENOUS | Status: DC

## 2019-10-08 MED ORDER — TRAMADOL HCL 50 MG PO TABS
50.0000 mg | ORAL_TABLET | Freq: Four times a day (QID) | ORAL | Status: DC | PRN
  Administered 2019-10-08 (×3): 50 mg via ORAL
  Filled 2019-10-08 (×3): qty 1

## 2019-10-08 MED ORDER — LIDOCAINE 5 % EX PTCH
1.0000 | MEDICATED_PATCH | CUTANEOUS | Status: DC
  Administered 2019-10-08: 1 via TRANSDERMAL
  Filled 2019-10-08: qty 1

## 2019-10-08 NOTE — Progress Notes (Signed)
PROGRESS NOTE    Katie [name]  RDE:[id number] DOB: 1940/07/20 DOA: 10/01/2019 PCP: [name], MD     Brief Narrative:  79 y.o. WF PMHx HTN, HLD, chronic A-fib on Xarelto, CHF with a BiVent AICD, severe pulmonary HTN, AAA CKD stage III , Hypothyroidism, Gout and Rheumatoid Arthritis  Presented to ED after AKI found by PCP and found to have perforated sigmoid diverticulitis with air into the mesentery, some moderate free air and 2 intra-abdominal abscesses.  Patient presents after being sent by PCP for AKI found on labs. She was recently admitted earlier this month for lower GI bleed and found to have diverticulitis and started on antibiotics. Reports diarrhea for several weeks with about 2-3 loose stools daily. Denies water stools. She stopped taking antibiotics about 4-5 days ago due to side effects. For the past month, reports right flank pain, suprapubic abdominal pain and generalized abdominal pain. Reports pain has not necessarily worsened but has persisted. She has not noticed a pattern of when pain is worse but pain is intermittent. She has continued to have some blood in her stools. Reports minimal appetite for several weeks and last ate 2 days ago. Last took Xarelto two days ago. She did vomit once  yesterday and today. Reports some "tingling" with urination. Denies headache, dizziness, fever, chills, cough, SOB, chest pain, constipation, dysuria, hematuria, melena, difficulty moving arms/legs, speech difficulty, confusion or any other complaints.  In the ED: Vitals stable on room air. Labs remarkable for Na 130, Cr 2.96, CO2 17, AG 17, WBC 13.3, Hgb 8.5, UA with moderate leuks.  CXR: non-acute CT Renal Study with perforated sigmoid diverticulitis with air into the mesentery, some moderate free air and 2 intra-abdominal abscesses.  Patient was started on Zosyn and given 1L IVF. General Surgery consulted by EDP and saw patient. Admission by hospitalist  requested.    Subjective: 7/26 afebrile overnight   A/O x4, states after surgery abdominal pain significantly decreased.  Negative nausea.   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 -Continue current antibiotics -S/p CT abscess drain placement RLQ 31f  Acute on CKDIIIb (baseline  Cr 1.4) -Hold all nephrotoxic medication -Hold furosemide, Lyrica, allopurinol -Monitor closely Recent Labs  Lab 10/02/19 0812 10/05/19 1134 09/20/2019 1042 10/07/19 0151 10/08/19 0444  CREATININE 1.68* 2.83* 2.96* 2.75* 2.32*    Severe pulmonary HTN -Previous echocardiogram May 2021 PASP 76.5 mmHg -Strict ins and outs +1.9 L -Daily weight Filed Weights   10/12/2019 1029 10/08/19 0500  Weight: 80.7 kg 84.6 kg   History of atherosclerosis of native coronary  artery of native heart/Atrial fibrillation - CHA2Ds2-VASc score= 5 - Xarelto will be held at this  time due to GI bleed and pre-operative state -Patient not on rate control agent -Currently NSR -We will restart Xarelto on 7/26  Hyponatremia -Most likely secondary to CHF, and renal failure - Na 130 on admission; previously 136 on 7/20 - Urine studies ordered - s/p 1 L NS in ED; hold Torsemide - Trend  -7/26 DC normal saline 34ml/hr -7/26 start sodium bicarb 75 ml/hr, secondary to hyponatremia and increasing renal failure, and increasing metabolic alkalosis.  Hyperlipidemia -Pravastatin 10 mg daily -Lipid panel pending   Hypothyroidism -Synthroid 125 mcg daily   Gout -- holding home Allopurinol and Lyrica due to AK  Neuropathic pain -See gout  Bilateral lower extremity wounds -Normally obtains outpatient wound care -7/25 request wound care consult;Patient with bilateral lower extremity wounds evaluate and dress  Hyperkalemia -Lokelma 10 g x 1  Muscle spasms  -Muscle spasms supraspinatus, scalene deltoid muscle, trapezius. -Robaxin IV 500 mg TID  -ADDENDUM DC Robaxin patient states confusion with Robaxin; DC, hot packs/cold packs and Tylenol    Goals of care -7/25 palliative care consult patient with multiple medical problems discussed change of code to DNR, advanced directives   DVT prophylaxis: SCD Code Status: Full Family Communication: 7/25 spoke with daughter discussed plan of care answered all questions Status is: Inpatient    Dispo: The patient is from: Home              Anticipated d/c is to: Home              Anticipated d/c date is: 8/2              Patient currently unstable      Consultants:  [facility] IR    Procedures/Significant Events:  5/31 Echocardiogram:Left Ventricle: LVEF=60 to 65%. left ventricle has no regional wall motion abnormalities.-moderate LVH   Right Ventricle:-Severely elevated PASP 76.5 mmHg.  - A pacer wire is visualized.   ------------------------------------------------------------------------------------------------------------------------------------- 7/25 s/p CT abscess drain placement RLQ 75f   I have personally reviewed and interpreted all radiology studies and my findings are as above.  VENTILATOR SETTINGS:    Cultures   Antimicrobials: Anti-infectives (From admission, onward)   Start     Ordered Stop   10/07/19 1400  piperacillin-tazobactam (ZOSYN) IVPB 2.25 g     Discontinue     10/07/19 1137     09/21/2019 2230  piperacillin-tazobactam (ZOSYN) IVPB 3.375 g  Status:  Discontinued        09/26/2019 2212 10/07/19 1137   09/19/2019 1900  piperacillin-tazobactam (ZOSYN) IVPB 3.375 g  Status:  Discontinued        10/07/2019 1846 09/14/2019 2218       Devices    LINES / TUBES:      Continuous Infusions: . sodium chloride 75 mL/hr at 10/08/19 0754  . piperacillin-tazobactam (ZOSYN)  IV 2.25 g (10/08/19 1331)     Objective: Vitals:   10/07/19 2017 10/08/19 0103 10/08/19 0416 10/08/19 0500  BP: (!) 125/44 (!) 123/60 (!) 124/50   Pulse: 69 70 71   Resp: 18 17 17    Temp: 98.9 F (37.2 C) 98.6 F (37 C) 98.4 F (36.9 C)   TempSrc:  Oral Oral   SpO2: 93% 95% 94%   Weight:    84.6 kg  Height:        Intake/Output Summary (Last 24 hours) at 10/08/2019 1405 Last data filed at 10/08/2019 1331 Gross  per 24 hour  Intake 1751.87 ml  Output 1810 ml  Net -58.13 ml   Filed Weights   09/28/2019 1029 10/08/19 0500  Weight: 80.7 kg 84.6 kg   Physical Exam:  General: A/O x4, No acute respiratory distress Eyes: negative scleral hemorrhage, negative anisocoria, negative icterus ENT: Negative Runny nose, negative gingival bleeding, Neck: Muscle spasms supraspinatus, scalene deltoid muscle, trapezius Lungs: Clear to auscultation bilaterally without wheezes or crackles Cardiovascular: Regular rate and rhythm without murmur gallop or rub normal S1 and S2 Abdomen: negative abdominal pain,  nondistended, positive soft, bowel sounds, no rebound, no ascites, no appreciable mass Extremities: No significant cyanosis, clubbing, or edema bilateral lower extremities Skin: Negative rashes, lesions, ulcers Psychiatric:  Negative depression, negative anxiety, negative fatigue, negative mania  Central nervous system:  Cranial nerves II through XII intact, tongue/uvula midline, all extremities muscle strength 5/5, sensation intact throughout, negative dysarthria, negative expressive aphasia, negative receptive aphasia.   .     Data Reviewed: Care during the described time interval was provided by me .  I have reviewed this patient's available data, including medical history, events of note, physical examination, and all test results as part of my evaluation.  CBC: Recent Labs  Lab 10/02/19 0812 10/05/19 1134 09/28/2019 1042 10/07/19 0151 10/08/19 0444  WBC 10.5 11.4* 13.3* 11.3* 8.3  NEUTROABS  --  9,941*  --   --  6.6  HGB 8.3* 9.0* 8.5* 7.9* 8.2*  HCT 26.0* 28.8* 27.7* 24.7* 26.6*  MCV 91.2 87.3 88.5 86.4 86.9  PLT 188 293 328 298 283   Basic Metabolic Panel: Recent Labs  Lab 10/02/19 0812 10/05/19 1134 10/12/2019 1042 09/17/2019 2231 10/07/19 0151 10/08/19 0444  NA 136 132* 130*  --  132* 136  K 3.7 4.6 4.9  --  5.3* 4.0  CL 102 96* 96*  --  99 106  CO2 23 20 17*  --  20* 16*  GLUCOSE 96 76 88  --  72 78  BUN 31* 45* 47*  --  49* 45*  CREATININE 1.68* 2.83* 2.96*  --  2.75* 2.32*  CALCIUM 7.9* 8.8 8.3*  --  7.9* 7.4*  MG  --   --   --  2.4 2.4 2.5*  PHOS  --   --   --  6.2*  --  5.3*   GFR: Estimated Creatinine Clearance: 20.9 mL/min (A) (by C-G formula based on SCr of 2.32 mg/dL (H)). Liver Function Tests: Recent Labs  Lab 10/05/19 1134 10/12/2019 1042 10/08/19 0444  AST 14 18 19   ALT 14 18 14   ALKPHOS  --  69 56  BILITOT 0.7 0.8 0.7  PROT 5.6* 5.5* 5.0*  ALBUMIN  --  2.1* 1.9*   No results for input(s): LIPASE, AMYLASE in the last 168 hours. No results  for input(s): AMMONIA in the last 168 hours. Coagulation Profile: No results for input(s): INR, PROTIME in the last 168 hours. Cardiac Enzymes: No results for input(s): CKTOTAL, CKMB, CKMBINDEX, TROPONINI in the last 168 hours. BNP (last 3 results) No results for input(s): PROBNP in the last 8760 hours. HbA1C: No results for input(s): HGBA1C in the last 72 hours. CBG: No results for input(s): GLUCAP in the last 168 hours. Lipid Profile: Recent Labs    10/08/19 0444  CHOL 65  HDL 13*  LDLCALC 30  TRIG 111  CHOLHDL 5.0   Thyroid Function Tests: No results for input(s): TSH, T4TOTAL, FREET4, T3FREE, THYROIDAB in the last 72 hours. Anemia Panel: No  results for input(s): VITAMINB12, FOLATE, FERRITIN, TIBC, IRON, RETICCTPCT in the last 72 hours. Sepsis Labs: Recent Labs  Lab 10/12/2019 2041 09/14/2019 2231  LATICACIDVEN 1.8 1.9    Recent Results (from the past 240 hour(s))  SARS Coronavirus 2 by RT PCR (hospital order, performed in [facility] hospital lab) Nasopharyngeal Nasopharyngeal Swab     Status: None   Collection Time: 09/28/19 11:30 PM   Specimen: Nasopharyngeal Swab  Result Value Ref Range Status   SARS Coronavirus 2 NEGATIVE NEGATIVE Final    Comment: (NOTE) SARS-CoV-2 target nucleic acids are NOT DETECTED.  The SARS-CoV-2 RNA is generally detectable in upper and lower respiratory specimens during the acute phase of infection. The lowest concentration of SARS-CoV-2 viral copies this assay can detect is 250 copies / mL. A negative result does not preclude SARS-CoV-2 infection and should not be used as the sole basis for treatment or other patient management decisions.  A negative result may occur with improper specimen collection / handling, submission of specimen other than nasopharyngeal swab, presence of viral mutation(s) within the areas targeted by this assay, and inadequate number of viral copies (<250 copies / mL). A negative result must be combined with  clinical observations, patient history, and epidemiological information.  Fact Sheet for Patients:   StrictlyIdeas.no  Fact Sheet for Healthcare Providers: BankingDealers.co.za  This test is not yet approved or  cleared by the [place] FDA and has been authorized for detection and/or diagnosis of SARS-CoV-2 by FDA under an Emergency Use Authorization (EUA).  This EUA will remain in effect (meaning this test can be used) for the duration of the COVID-19 declaration under Section 564(b)(1) of the Act, 21 U.S.C. section 360bbb-3(b)(1), unless the authorization is terminated or revoked sooner.  Performed at [facility], [address], [place], [place] [address]   Urine Culture     Status: Abnormal   Collection Time: 09/29/19  1:13 AM   Specimen: Urine, Clean Catch  Result Value Ref Range Status   Specimen Description   Final    URINE, CLEAN CATCH Performed at [facility], [address], [place], [place] [address]    Special Requests   Final    NONE Performed at [facility], [address] [place], [place] [address]    Culture (A)  Final    <10,000 COLONIES/mL INSIGNIFICANT GROWTH Performed at [facility], [place] [address] [place], [place] [address]    Report Status 09/30/2019 FINAL  Final  Culture, blood (Routine X 2) w Reflex to ID Panel     Status: None   Collection Time: 09/29/19  1:36 AM   Specimen: Left Antecubital; Blood  Result Value Ref Range Status   Specimen Description LEFT ANTECUBITAL  Final   Special Requests   Final    BOTTLES DRAWN AEROBIC AND ANAEROBIC Blood Culture adequate volume   Culture   Final    NO GROWTH 5 DAYS Performed at [facility], [address], [place],  [address]    Report Status 10/04/2019 FINAL  Final  Culture, blood (Routine X 2) w Reflex to ID Panel     Status: None   Collection Time: 09/29/19  6:16 AM   Specimen: BLOOD LEFT WRIST  Result Value Ref Range Status    Specimen Description BLOOD LEFT WRIST BOTTLES DRAWN AEROBIC ONLY  Final   Special Requests Blood Culture adequate volume  Final   Culture   Final    NO GROWTH 5 DAYS Performed at [facility], [address], [place], [place] [address]    Report Status 10/04/2019 FINAL  Final  KOH prep     Status: None   Collection Time: 09/29/19 12:33 PM   Specimen: Bronchial Brush  Result Value Ref Range Status   Specimen Description BRONCHIAL BRUSHING  Final   Special Requests NONE  Final   KOH Prep   Final    FEW YEAST Performed at [facility], [address], [place], [place] [address]    Report Status 09/29/2019 FINAL  Final  SARS Coronavirus 2 by RT PCR (hospital order, performed in [facility] hospital lab) Nasopharyngeal Nasopharyngeal Swab     Status: None   Collection Time: 09/23/2019  8:41 PM   Specimen: Nasopharyngeal Swab  Result Value Ref Range Status   SARS Coronavirus 2 NEGATIVE NEGATIVE Final    Comment: (NOTE) SARS-CoV-2 target nucleic acids are NOT DETECTED.  The SARS-CoV-2 RNA is generally detectable in upper and lower respiratory specimens during the acute phase of infection. The lowest concentration of SARS-CoV-2 viral copies this assay can detect is 250 copies / mL. A negative result does not preclude SARS-CoV-2 infection and should not be used as the sole basis for treatment or other patient management decisions.  A negative result may occur with improper specimen collection / handling, submission of specimen other than nasopharyngeal swab, presence of viral mutation(s) within the areas targeted by this assay, and inadequate number of viral copies (<250 copies / mL). A negative result must be combined with clinical observations, patient history, and epidemiological information.  Fact Sheet for Patients:   StrictlyIdeas.no  Fact Sheet for Healthcare Providers: BankingDealers.co.za  This test is not yet approved or  cleared  by the [place] FDA and has been authorized for detection and/or diagnosis of SARS-CoV-2 by FDA under an Emergency Use Authorization (EUA).  This EUA will remain in effect (meaning this test can be used) for the duration of the COVID-19 declaration under Section 564(b)(1) of the Act, 21 U.S.C. section 360bbb-3(b)(1), unless the authorization is terminated or revoked sooner.  Performed at [facility], [place] [address], [place], [place] [address]   Aerobic/Anaerobic Culture (surgical/deep wound)     Status: None (Preliminary result)   Collection Time: 10/07/19  3:05 PM   Specimen: Abscess  Result Value Ref Range Status   Specimen Description ABSCESS  Final   Special Requests DRAIN  Final   Gram Stain   Final    RARE WBC PRESENT, PREDOMINANTLY PMN RARE GRAM POSITIVE RODS    Culture   Final    CULTURE REINCUBATED FOR BETTER GROWTH Performed at [facility], [place] [address], [place], [place] [address]    Report Status PENDING  Incomplete         Radiology Studies: DG Chest Portable 1 View  Result Date: 09/24/2019 CLINICAL DATA:  79 year old female with history of shortness of breath. EXAM: PORTABLE CHEST 1 VIEW COMPARISON:  Chest x-ray 08/12/2019. FINDINGS: Lung volumes are normal. No acute consolidative airspace disease. No pleural effusions. Scattered areas of interstitial prominence throughout the lungs bilaterally along with widespread peribronchial cuffing, similar to prior examinations. No pneumothorax. Heart size is moderately enlarged. Upper mediastinal contours are within normal limits. Aortic atherosclerosis. Left-sided biventricular pacemaker/AICD with lead tips projecting over the expected location of the right atrium, right ventricle and overlying the left ventricle via the coronary sinus and coronary veins. IMPRESSION: 1. No radiographic evidence of acute cardiopulmonary disease. 2. 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 3. Moderate cardiomegaly. 4. Aortic atherosclerosis. Electronically Signed   By: Vinnie Langton M.D.   On: 09/13/2019 18:19   CT Renal Stone Study  Addendum Date: 10/09/2019   ADDENDUM REPORT: 10/09/2019 18:42 ADDENDUM: These results were called by telephone at the time of interpretation on 09/19/2019 at 6:41 pm to provider [facility] , who verbally acknowledged these results. Electronically Signed   By: Fidela Salisbury MD   On: 10/05/2019 18:42   Result Date: 10/12/2019 CLINICAL DATA:  Flank pain, acute renal failure, right flank pain EXAM: CT ABDOMEN AND PELVIS WITHOUT CONTRAST TECHNIQUE: Multidetector CT imaging of the abdomen and pelvis was performed following the standard protocol without IV contrast. COMPARISON:  09/28/2019 FINDINGS: Lower chest: Bibasilar ground-glass pulmonary infiltrates and peripheral reticulation appears slightly progressive at the left lung base since prior examination may reflect the sequela of subacute atypical infection. Mild global cardiomegaly is present with particular right atrial enlargement. Extensive coronary artery calcifications present. Pacemaker leads are seen within the right ventricle and left ventricular venous outflow. Hepatobiliary: Status post cholecystectomy. Liver is unremarkable. No intra or extrahepatic biliary ductal dilation. Pancreas: Unremarkable Spleen: Unremarkable Adrenals/Urinary Tract: The adrenal glands and kidneys are unremarkable. The bladder is largely decompressed. Stomach/Bowel: Since the prior examination, there has developed moderate free intraperitoneal gas as well as gas dissecting within the mesentery. Additionally, there have developed two distinct pericolonic gas and fluid collections within the left hemipelvis measuring 8.4 x 5.2 by 4.9 cm at axial image # 64/3 and 4.9 x 3.7 x 2.5 cm on axial image # 73/3 and sagittal image # 60/7. The findings are most in keeping with perforated sigmoid diverticulitis with development of  at least to pericolonic abscesses. No evidence of obstruction. Stomach, small bowel, are unremarkable. Mild to moderate sigmoid diverticulosis is again identified. 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 Extensive atherosclerotic calcification within the supra celiac and juxtarenal aorta. Infrarenal abdominal aortic endograft is in place extending into the common iliac arteries bilaterally. This is better assessed on prior CT a of 09/28/2019. Extensive atherosclerotic calcification is seen within the lower extremity arterial inflow bilaterally. No pathologic adenopathy within the abdomen and pelvis. Reproductive: Unremarkable Other: There has developed extensive subcutaneous edema within the dependent body wall possibly related to developing anasarca. Musculoskeletal: No acute bone abnormality. Degenerative changes are seen within the lumbar spine. IMPRESSION: Perforated sigmoid diverticulitis with moderate free intraperitoneal gas and development of to pericolonic abscesses within the pelvis. No evidence of obstruction. Progressive dependent body wall edema may reflect changes of progressive anasarca. Progressive bibasilar pulmonary infiltrates possibly reflecting changes of subacute infection. Aortic Atherosclerosis (ICD10-I70.0). Electronically Signed: By: Fidela Salisbury MD On: 09/22/2019 18:36   CT IMAGE GUIDED DRAINAGE BY PERCUTANEOUS CATHETER  Result Date: 10/07/2019 CLINICAL DATA:  Pelvic abscess, presumed diverticular origin EXAM: CT GUIDED DRAINAGE OF PELVIC ABSCESS ANESTHESIA/SEDATION: Intravenous Fentanyl 67mcg and Versed 2mg  were administered as conscious sedation during continuous monitoring of the patient's level of consciousness and physiological / cardiorespiratory status by the radiology RN, with a total moderate sedation time of 21 minutes. PROCEDURE: The procedure, risks, benefits, and  alternatives were explained to the patient. Questions regarding the procedure were encouraged and answered. The patient understands and consents to the procedure. Patient placed right lateral decubitus. Select axial scans through the pelvis were obtained. The collection was localized and an appropriate skin entry site was determined and marked. The operative  field was prepped with chlorhexidinein a sterile fashion, and a sterile drape was applied covering the operative field. A sterile gown and sterile gloves were used for the procedure. Local anesthesia was provided with 1% Lidocaine. Under CT fluoroscopic guidance, 18 gauge trocar needle advanced into the collection. An Amplatz guidewire advanced easily within the collection. Tract dilated to allow placement of a 12 French pigtail drain catheter, formed centrally within the collection. Approximately 5 mL of bloody slightly purulent material were aspirated, sent for Gram stain and culture. The catheter was secured externally with 0 Prolene suture and StatLock and placed to gravity drain bag. The patient tolerated the procedure well. COMPLICATIONS: None immediate FINDINGS: Left pelvic gas and fluid collection was localized. 12 French pigtail drain catheter placed as above. 5 mL bloody aspirate was sent for Gram stain and culture. IMPRESSION: Technically successful CT-guided pelvic abscess drain catheter placement. Electronically Signed   By: Lucrezia Europe M.D.   On: 10/07/2019 15:13        Scheduled Meds: . famotidine  20 mg Oral Daily  . fluticasone furoate-vilanterol  1 puff Inhalation Daily  . levothyroxine  125 mcg Oral Q0600  . lidocaine  1 patch Transdermal Q24H  . montelukast  10 mg Oral Daily  . pravastatin  10 mg Oral QODAY  . saccharomyces boulardii  250 mg Oral BID  . sodium chloride flush  5 mL Intracatheter Q8H   Continuous Infusions: . sodium chloride 75 mL/hr at 10/08/19 0754  . piperacillin-tazobactam (ZOSYN)  IV 2.25 g (10/08/19  1331)     LOS: 2 days    Time spent:40 min    Juleen Sorrels, Geraldo Docker, MD [facility] Pager [phone]  If 7PM-7AM, please contact night-coverage [email or web address] Password [email or web address] 10/08/2019, 2:05 PM

## 2019-10-08 NOTE — TOC Initial Note (Signed)
Transition of Care [facility]) - Initial/Assessment Note    Patient Details  Name: Katie [name] MRN: [id number] Date of Birth: December 10, 1940  Transition of Care [facility]) CM/SW Contact:    Alexander Mt, LCSW Phone Number: 10/08/2019, 11:13 AM  Clinical Narrative:                 CSW spoke with pt at bedside. Introduced self, role, reason for visit. Pt from home with her husband. She has supportive adult children and extended family as well. Pt confirmed home address and PCP, she is okay with CSW reaching out for f/u appointment when she is closer to d/c. Pt uses home oxygen at night and she thinks it is through [employer]. She has home health services through [place] and would like to continue those. Pt has multiple pieces of DME including a walker, cane and bedside commode. No additional concerns noted at this time from pt.  CSW followed up with Nanine Means at home [place] who confirmed pt active with HHPT and [place]. TOC team will follow, pt will need new orders and face to face for home health services.   Expected Discharge Plan: [place] Barriers to Discharge: Continued Medical Work up   Patient Goals and CMS Choice Patient states their goals for this hospitalization and ongoing recovery are:: return home w/ home health when ready CMS Medicare.gov Compare Post Acute Care list provided to:: Patient (already active with [facility]) Choice offered to / list presented to : Patient  Expected Discharge Plan and Services Expected Discharge Plan: [place] In-house Referral: Clinical Social Work Discharge Planning Services: CM Consult, Follow-up appt scheduled Post Acute Care Choice: Resumption of Svcs/PTA Provider, Home Health, Durable Medical Equipment Living arrangements for the past 2 months: Single Family Home                 DME Arranged: Oxygen DME Agency: [employer] HH Arranged: PT, OT HH Agency: [place] Date [place]:  10/08/19 Time HH Agency Contacted: 1111 Representative spoke with at [place]: Danny Lawless  Prior Living Arrangements/Services Living arrangements for the past 2 months: Single Family Home Lives with:: Spouse Patient language and need for interpreter reviewed:: Yes (no needs) Do you feel safe going back to the place where you live?: Yes      Need for Family Participation in Patient Care: Yes (Comment) Care giver support system in place?: Yes (comment) Current home services: DME, Home PT Criminal Activity/Legal Involvement Pertinent to Current Situation/Hospitalization: No - Comment as needed  Activities of Daily Living Home Assistive Devices/Equipment: Cane (specify quad or straight) ADL Screening (condition at time of admission) Patient's cognitive ability adequate to safely complete daily activities?: Yes Is the patient deaf or have difficulty hearing?: No Does the patient have difficulty seeing, even when wearing glasses/contacts?: No Does the patient have difficulty concentrating, remembering, or making decisions?: No Patient able to express need for assistance with ADLs?: Yes Does the patient have difficulty dressing or bathing?: No Independently performs ADLs?: Yes (appropriate for developmental age) Does the patient have difficulty walking or climbing stairs?: Yes Weakness of Legs: Both Weakness of Arms/Hands: None  Permission Sought/Granted Permission sought to share information with : PCP, Family Supports Permission granted to share information with : Yes, Verbal Permission Granted  Share Information with NAME: Grant Fontana; [place] granted to share info w AGENCY: PCP/[place]  Permission granted to share info w Relationship: spouse; daughter  Permission  granted to share info w Contact Information: [phone] (spouse)  Emotional Assessment Appearance:: Appears stated age Attitude/Demeanor/Rapport: Engaged Affect (typically observed): Accepting,  Adaptable, Appropriate Orientation: : Oriented to Situation, Oriented to  Time, Oriented to Place, Oriented to Self Alcohol / Substance Use: Not Applicable Psych Involvement: No (comment)  Admission diagnosis:  Intra-abdominal abscess ([place]) [K65.1] Perforated diverticulum [K57.80] Perforation and abscess of large intestine concurrent with and due to diverticulitis [K57.20] Patient Active Problem List   Diagnosis Date Noted  . Perforated diverticulum 09/27/2019  . Intra-abdominal abscess ([place]) 09/20/2019  . Hyponatremia 10/12/2019  . AKI (acute kidney injury) ([place]) 10/01/2019  . Acute metabolic encephalopathy 01/09/2535  . Diverticulitis 09/29/2019  . Symptomatic anemia 09/29/2019  . Acute lower UTI 09/29/2019  . Open wound of right foot 09/29/2019  . Leukocytosis 09/29/2019  . Dehydration 09/29/2019  . Acute GI bleeding 09/28/2019  . Acute encephalopathy 08/12/2019  . Senile purpura ([place]) 05/30/2019  . Rheumatoid arthritis involving multiple sites ([place]) 05/30/2019  . Obesity (BMI 30.0-34.9) 05/30/2019  . Mononeuropathy 11/23/2018  . 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 COPD (chronic obstructive pulmonary disease) with chronic bronchitis ([place]) 02/02/2018  . Aortic atherosclerosis ([place]) 10/17/2017  . PAH (pulmonary artery hypertension) ([place]) 06/23/2017  . Gout 03/20/2017  . Abnormal glucose 09/19/2015  . AAA (abdominal aortic aneurysm) without rupture ([place]) 04/22/2015  . PVD (peripheral vascular disease) with claudication ([place]) 10/16/2013  . CKD (chronic kidney disease) stage 3, GFR 30-59 ml/min 06/08/2013  . Hyperlipidemia, mixed 06/08/2013  . 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 Long term current use of anticoagulant therapy 04/23/2013  . Vitamin D deficiency 02/15/2013  . Hypothyroidism   . Osteopenia   . Chronic combined systolic and diastolic CHF (congestive heart failure) ([place]) 11/25/2008  . Essential  hypertension 11/22/2008  . Atherosclerosis of native coronary artery of native heart without angina pectoris 11/22/2008  . Atrial fibrillation ([place]) 11/22/2008  . COPD (chronic obstructive pulmonary disease) ([place]) 11/22/2008  . Gastroesophageal reflux disease without esophagitis 11/22/2008   PCP:  [name], MD Pharmacy:   [id number] [place], [place] [address] [place] [address] Phone: [phone] Fax: [phone]  Readmission Risk Interventions Readmission Risk Prevention Plan 10/08/2019 10/01/2019  Transportation Screening Complete Complete  Medication Review (RN Care Manager) Referral to Pharmacy Complete  PCP or Specialist appointment within 3-5 days of discharge Complete -  [place] or [place] Complete -  SW Recovery Care/Counseling Consult Complete Complete  Palliative Care Screening Not Applicable Not [place] Not Applicable Not Applicable  Some recent data might be hidden

## 2019-10-08 NOTE — Progress Notes (Signed)
Supervising Physician: Corrie Mckusick  Patient Status: Katie [name] [facility] - In-pt  Subjective: S/p perc drain to LLQ abscess Feeling okay, sore at drain site and also c/o back pain. Sitting up in chair at time of visit.  Objective: Physical Exam: BP (!) 124/50 (BP Location: Right Arm)   Pulse 71   Temp 98.4 F (36.9 C) (Oral)   Resp 17   Ht 5\' 3"  (1.6 m)   Wt 84.6 kg   SpO2 94%   BMI 33.04 kg/m  LLQ drain intact site clean. Tubing kinked as it is folded over her undergarment. Rearranged drain to come out at an inferior angle and prompt flow of hazy fluid/debris.    Current Facility-Administered Medications:  .  0.9 %  sodium chloride infusion, , Intravenous, Continuous, Allie Bossier, MD, Last Rate: 75 mL/hr at 10/08/19 0754, Rate Change at 10/08/19 0754 .  acetaminophen (TYLENOL) tablet 1,000 mg, 1,000 mg, Oral, Q8H PRN, Fair, Chelsea N, MD, 1,000 mg at 10/08/19 0430 .  albuterol (PROVENTIL) (2.5 MG/3ML) 0.083% nebulizer solution 2.5 mg, 2.5 mg, Nebulization, Q6H PRN, Donnamae Jude, RPH .  famotidine (PEPCID) tablet 20 mg, 20 mg, Oral, Daily, Norm Parcel, PA-C, 20 mg at 10/08/19 9024 .  fluticasone furoate-vilanterol (BREO ELLIPTA) 100-25 MCG/INH 1 puff, 1 puff, Inhalation, Daily, Fair, Chelsea N, MD .  ipratropium (ATROVENT) 0.06 % nasal spray 1-2 spray, 1-2 spray, Each Nare, TID PRN, Fair, Marin Shutter, MD .  levothyroxine (SYNTHROID) tablet 125 mcg, 125 mcg, Oral, Q0600, Fair, Marin Shutter, MD, 125 mcg at 10/08/19 0555 .  montelukast (SINGULAIR) tablet 10 mg, 10 mg, Oral, Daily, Fair, Marin Shutter, MD, 10 mg at 10/08/19 0973 .  ondansetron (ZOFRAN) injection 4 mg, 4 mg, Intravenous, Q6H PRN, Romana Juniper A, MD, 4 mg at 10/07/19 2316 .  piperacillin-tazobactam (ZOSYN) IVPB 2.25 g, 2.25 g, Intravenous, Q8H, Esmond Plants, RPH, Last Rate: 100 mL/hr at 10/08/19 0556, 2.25 g at 10/08/19 0556 .  pravastatin (PRAVACHOL) tablet 10 mg, 10 mg, Oral, QODAY, Fair, Chelsea N, MD, 10 mg at  09/28/2019 2248 .  saccharomyces boulardii (FLORASTOR) capsule 250 mg, 250 mg, Oral, BID, Norm Parcel, PA-C, 250 mg at 10/08/19 5329 .  sodium chloride flush (NS) 0.9 % injection 5 mL, 5 mL, Intracatheter, Q8H, Arne Cleveland, MD, 5 mL at 10/08/19 0604 .  traMADol (ULTRAM) tablet 50 mg, 50 mg, Oral, Q6H PRN, Chotiner, Yevonne Aline, MD, 50 mg at 10/08/19 0759 .  traZODone (DESYREL) tablet 50 mg, 50 mg, Oral, QHS PRN, Chotiner, Yevonne Aline, MD, 50 mg at 10/07/19 2319  Labs: CBC Recent Labs    10/07/19 0151 10/08/19 0444  WBC 11.3* 8.3  HGB 7.9* 8.2*  HCT 24.7* 26.6*  PLT 298 290   BMET Recent Labs    10/07/19 0151 10/08/19 0444  NA 132* 136  K 5.3* 4.0  CL 99 106  CO2 20* 16*  GLUCOSE 72 78  BUN 49* 45*  CREATININE 2.75* 2.32*  CALCIUM 7.9* 7.4*   LFT Recent Labs    09/27/2019 1042 09/20/2019 1042 10/08/19 0444  PROT 5.5*   < > 5.0*  ALBUMIN 2.1*   < > 1.9*  AST 18   < > 19  ALT 18   < > 14  ALKPHOS 69   < > 56  BILITOT 0.8   < > 0.7  BILIDIR 0.6*  --   --   IBILI 0.2*  --   --    < > =  values in this interval not displayed.   PT/INR No results for input(s): LABPROT, INR in the last 72 hours.   Studies/Results: DG Chest Portable 1 View  Result Date: 09/21/2019 CLINICAL DATA:  79 year old female with history of shortness of breath. EXAM: PORTABLE CHEST 1 VIEW COMPARISON:  Chest x-ray 08/12/2019. FINDINGS: Lung volumes are normal. No acute consolidative airspace disease. No pleural effusions. Scattered areas of interstitial prominence throughout the lungs bilaterally along with widespread peribronchial cuffing, similar to prior examinations. No pneumothorax. Heart size is moderately enlarged. Upper mediastinal contours are within normal limits. Aortic atherosclerosis. Left-sided biventricular pacemaker/AICD with lead tips projecting over the expected location of the right atrium, right ventricle and overlying the left ventricle via the coronary sinus and coronary veins.  IMPRESSION: 1. No radiographic evidence of acute cardiopulmonary disease. 2. Chronic lung changes suggestive of pulmonary fibrosis redemonstrated, as above. 3. Moderate cardiomegaly. 4. Aortic atherosclerosis. Electronically Signed   By: Vinnie Langton M.D.   On: 09/17/2019 18:19   CT Renal Stone Study  Addendum Date: 09/27/2019   ADDENDUM REPORT: 09/14/2019 18:42 ADDENDUM: These results were called by telephone at the time of interpretation on 09/13/2019 at 6:41 pm to provider [facility] , who verbally acknowledged these results. Electronically Signed   By: Fidela Salisbury MD   On: 09/13/2019 18:42   Result Date: 09/17/2019 CLINICAL DATA:  Flank pain, acute renal failure, right flank pain EXAM: CT ABDOMEN AND PELVIS WITHOUT CONTRAST TECHNIQUE: Multidetector CT imaging of the abdomen and pelvis was performed following the standard protocol without IV contrast. COMPARISON:  09/28/2019 FINDINGS: Lower chest: Bibasilar ground-glass pulmonary infiltrates and peripheral reticulation appears slightly progressive at the left lung base since prior examination may reflect the sequela of subacute atypical infection. Mild global cardiomegaly is present with particular right atrial enlargement. Extensive coronary artery calcifications present. Pacemaker leads are seen within the right ventricle and left ventricular venous outflow. Hepatobiliary: Status post cholecystectomy. Liver is unremarkable. No intra or extrahepatic biliary ductal dilation. Pancreas: Unremarkable Spleen: Unremarkable Adrenals/Urinary Tract: The adrenal glands and kidneys are unremarkable. The bladder is largely decompressed. Stomach/Bowel: Since the prior examination, there has developed moderate free intraperitoneal gas as well as gas dissecting within the mesentery. Additionally, there have developed two distinct pericolonic gas and fluid collections within the left hemipelvis measuring 8.4 x 5.2 by 4.9 cm at axial image # 64/3 and 4.9 x 3.7 x  2.5 cm on axial image # 73/3 and sagittal image # 60/7. The findings are most in keeping with perforated sigmoid diverticulitis with development of at least to pericolonic abscesses. No evidence of obstruction. Stomach, small bowel, are unremarkable. Mild to moderate sigmoid diverticulosis is again identified. 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 Extensive atherosclerotic calcification within the supra celiac and juxtarenal aorta. Infrarenal abdominal aortic endograft is in place extending into the common iliac arteries bilaterally. This is better assessed on prior CT a of 09/28/2019. Extensive atherosclerotic calcification is seen within the lower extremity arterial inflow bilaterally. No pathologic adenopathy within the abdomen and pelvis. Reproductive: Unremarkable Other: There has developed extensive subcutaneous edema within the dependent body wall possibly related to developing anasarca. Musculoskeletal: No acute bone abnormality. Degenerative changes are seen within the lumbar spine. IMPRESSION: Perforated sigmoid diverticulitis with moderate free intraperitoneal gas and development of to pericolonic abscesses within the pelvis. No evidence of obstruction. Progressive dependent body wall edema may reflect changes of progressive anasarca. Progressive bibasilar pulmonary  infiltrates possibly reflecting changes of subacute infection. Aortic Atherosclerosis (ICD10-I70.0). Electronically Signed: By: Fidela Salisbury MD On: 09/25/2019 18:36   CT IMAGE GUIDED DRAINAGE BY PERCUTANEOUS CATHETER  Result Date: 10/07/2019 CLINICAL DATA:  Pelvic abscess, presumed diverticular origin EXAM: CT GUIDED DRAINAGE OF PELVIC ABSCESS ANESTHESIA/SEDATION: Intravenous Fentanyl 4mcg and Versed 2mg  were administered as conscious sedation during continuous monitoring of the patient's level of consciousness and physiological /  cardiorespiratory status by the radiology RN, with a total moderate sedation time of 21 minutes. PROCEDURE: The procedure, risks, benefits, and alternatives were explained to the patient. Questions regarding the procedure were encouraged and answered. The patient understands and consents to the procedure. Patient placed right lateral decubitus. Select axial scans through the pelvis were obtained. The collection was localized and an appropriate skin entry site was determined and marked. The operative field was prepped with chlorhexidinein a sterile fashion, and a sterile drape was applied covering the operative field. A sterile gown and sterile gloves were used for the procedure. Local anesthesia was provided with 1% Lidocaine. Under CT fluoroscopic guidance, 18 gauge trocar needle advanced into the collection. An Amplatz guidewire advanced easily within the collection. Tract dilated to allow placement of a 12 French pigtail drain catheter, formed centrally within the collection. Approximately 5 mL of bloody slightly purulent material were aspirated, sent for Gram stain and culture. The catheter was secured externally with 0 Prolene suture and StatLock and placed to gravity drain bag. The patient tolerated the procedure well. COMPLICATIONS: None immediate FINDINGS: Left pelvic gas and fluid collection was localized. 12 French pigtail drain catheter placed as above. 5 mL bloody aspirate was sent for Gram stain and culture. IMPRESSION: Technically successful CT-guided pelvic abscess drain catheter placement. Electronically Signed   By: Lucrezia Europe M.D.   On: 10/07/2019 15:13    Assessment/Plan: S/p perc drain to LLQ abscess 7/25 Stable Encouraged pt to watch tubing for kinking. Continue daily drain care/flushes. IR following    LOS: 2 days   I spent a total of 15 minutes in face to face in clinical consultation, greater than 50% of which was counseling/coordinating care for LLQ abscess drain  Ascencion Dike PA-C 10/08/2019 10:26 AM

## 2019-10-08 NOTE — Consult Note (Signed)
   [facility] CM Inpatient Consult   10/08/2019  Katie [name] 25-Jan-1941 [id number]   Patient chart reviewed for readmission less than 30 days and high unplanned readmission risk score. Patient is currently active with [facility] for chronic disease management services.  Patient has been engaged by a [facility] for follow up.   Will notify [facility] CM care manager of patient admission and continue to follow for progression and disposition plans.  Of note, [facility] Care Management services does not replace or interfere with any services that are arranged by inpatient case management or social work.  Netta Cedars, MSN, [facility] Liaison Nurse Mobile Phone [phone]  Toll free office [phone]

## 2019-10-08 NOTE — Progress Notes (Signed)
[facility] Progress Note     Subjective: Patient reports she is having diarrhea. Seemed exacerbated by broth and coffee last night. Abdominal pain is mild. Reports back pain this AM and reflux. Denies nausea. No worsened pain with CLD.   Objective: Vital signs in last 24 hours: Temp:  [97.4 F (36.3 C)-98.9 F (37.2 C)] 98.4 F (36.9 C) (07/26 0416) Pulse Rate:  [69-71] 71 (07/26 0416) Resp:  [12-19] 17 (07/26 0416) BP: (110-136)/(44-83) 124/50 (07/26 0416) SpO2:  [93 %-100 %] 94 % (07/26 0416) Weight:  [84.6 kg] 84.6 kg (07/26 0500) Last BM Date: 10/07/19  Intake/Output from previous day: 07/25 0701 - 07/26 0700 In: 1201.9 [P.O.:300; I.V.:785.2; IV Piggyback:106.7] Out: 1205 [Urine:1200; Drains:5] Intake/Output this shift: Total I/O In: 360 [P.O.:360] Out: -   PE: General: pleasant, WD, chronically ill appearing female who is sitting in chair in NAD Heart: regular, rate, and rhythm.  Normal s1,s2. No obvious murmurs, gallops, or rubs noted.  Palpable radial and pedal pulses bilaterally Lungs: CTAB, no wheezes, rhonchi, or rales noted.  Respiratory effort nonlabored. On supplemental oxygen  Abd: soft, NT, mildly distended, +BS, drain with minimal serous fluid    Lab Results:  Recent Labs    10/07/19 0151 10/08/19 0444  WBC 11.3* 8.3  HGB 7.9* 8.2*  HCT 24.7* 26.6*  PLT 298 290   BMET Recent Labs    10/07/19 0151 10/08/19 0444  NA 132* 136  K 5.3* 4.0  CL 99 106  CO2 20* 16*  GLUCOSE 72 78  BUN 49* 45*  CREATININE 2.75* 2.32*  CALCIUM 7.9* 7.4*   PT/INR No results for input(s): LABPROT, INR in the last 72 hours. CMP     Component Value Date/Time   NA 136 10/08/2019 0444   NA 141 09/05/2018 1107   K 4.0 10/08/2019 0444   CL 106 10/08/2019 0444   CO2 16 (L) 10/08/2019 0444   GLUCOSE 78 10/08/2019 0444   BUN 45 (H) 10/08/2019 0444   BUN 23 09/05/2018 1107   CREATININE 2.32 (H) 10/08/2019 0444   CREATININE 2.83 (H) 10/05/2019 1134    CALCIUM 7.4 (L) 10/08/2019 0444   PROT 5.0 (L) 10/08/2019 0444   ALBUMIN 1.9 (L) 10/08/2019 0444   AST 19 10/08/2019 0444   ALT 14 10/08/2019 0444   ALKPHOS 56 10/08/2019 0444   BILITOT 0.7 10/08/2019 0444   GFRNONAA 20 (L) 10/08/2019 0444   GFRNONAA 15 (L) 10/05/2019 1134   GFRAA 23 (L) 10/08/2019 0444   GFRAA 18 (L) 10/05/2019 1134   Lipase     Component Value Date/Time   LIPASE 33 05/23/2018 0203       Studies/Results: DG Chest Portable 1 View  Result Date: 10/08/2019 CLINICAL DATA:  79 year old female with history of shortness of breath. EXAM: PORTABLE CHEST 1 VIEW COMPARISON:  Chest x-ray 08/12/2019. FINDINGS: Lung volumes are normal. No acute consolidative airspace disease. No pleural effusions. Scattered areas of interstitial prominence throughout the lungs bilaterally along with widespread peribronchial cuffing, similar to prior examinations. No pneumothorax. Heart size is moderately enlarged. Upper mediastinal contours are within normal limits. Aortic atherosclerosis. Left-sided biventricular pacemaker/AICD with lead tips projecting over the expected location of the right atrium, right ventricle and overlying the left ventricle via the coronary sinus and coronary veins. IMPRESSION: 1. No radiographic evidence of acute cardiopulmonary disease. 2. Chronic lung changes suggestive of pulmonary fibrosis redemonstrated, as above. 3. Moderate cardiomegaly. 4. Aortic atherosclerosis. Electronically Signed   By: Mauri Brooklyn.D.  On: 09/30/2019 18:19   CT Renal Stone Study  Addendum Date: 09/23/2019   ADDENDUM REPORT: 09/21/2019 18:42 ADDENDUM: These results were called by telephone at the time of interpretation on 10/12/2019 at 6:41 pm to provider [facility] , who verbally acknowledged these results. Electronically Signed   By: Fidela Salisbury MD   On: 10/09/2019 18:42   Result Date: 10/07/2019 CLINICAL DATA:  Flank pain, acute renal failure, right flank pain EXAM: CT ABDOMEN  AND PELVIS WITHOUT CONTRAST TECHNIQUE: Multidetector CT imaging of the abdomen and pelvis was performed following the standard protocol without IV contrast. COMPARISON:  09/28/2019 FINDINGS: Lower chest: Bibasilar ground-glass pulmonary infiltrates and peripheral reticulation appears slightly progressive at the left lung base since prior examination may reflect the sequela of subacute atypical infection. Mild global cardiomegaly is present with particular right atrial enlargement. Extensive coronary artery calcifications present. Pacemaker leads are seen within the right ventricle and left ventricular venous outflow. Hepatobiliary: Status post cholecystectomy. Liver is unremarkable. No intra or extrahepatic biliary ductal dilation. Pancreas: Unremarkable Spleen: Unremarkable Adrenals/Urinary Tract: The adrenal glands and kidneys are unremarkable. The bladder is largely decompressed. Stomach/Bowel: Since the prior examination, there has developed moderate free intraperitoneal gas as well as gas dissecting within the mesentery. Additionally, there have developed two distinct pericolonic gas and fluid collections within the left hemipelvis measuring 8.4 x 5.2 by 4.9 cm at axial image # 64/3 and 4.9 x 3.7 x 2.5 cm on axial image # 73/3 and sagittal image # 60/7. The findings are most in keeping with perforated sigmoid diverticulitis with development of at least to pericolonic abscesses. No evidence of obstruction. Stomach, small bowel, are unremarkable. Mild to moderate sigmoid diverticulosis is again identified. 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 Extensive atherosclerotic calcification within the supra celiac and juxtarenal aorta. Infrarenal abdominal aortic endograft is in place extending into the common iliac arteries bilaterally. This is better assessed on prior CT a of 09/28/2019. Extensive atherosclerotic  calcification is seen within the lower extremity arterial inflow bilaterally. No pathologic adenopathy within the abdomen and pelvis. Reproductive: Unremarkable Other: There has developed extensive subcutaneous edema within the dependent body wall possibly related to developing anasarca. Musculoskeletal: No acute bone abnormality. Degenerative changes are seen within the lumbar spine. IMPRESSION: Perforated sigmoid diverticulitis with moderate free intraperitoneal gas and development of to pericolonic abscesses within the pelvis. No evidence of obstruction. Progressive dependent body wall edema may reflect changes of progressive anasarca. Progressive bibasilar pulmonary infiltrates possibly reflecting changes of subacute infection. Aortic Atherosclerosis (ICD10-I70.0). Electronically Signed: By: Fidela Salisbury MD On: 09/17/2019 18:36   CT IMAGE GUIDED DRAINAGE BY PERCUTANEOUS CATHETER  Result Date: 10/07/2019 CLINICAL DATA:  Pelvic abscess, presumed diverticular origin EXAM: CT GUIDED DRAINAGE OF PELVIC ABSCESS ANESTHESIA/SEDATION: Intravenous Fentanyl 63mcg and Versed 2mg  were administered as conscious sedation during continuous monitoring of the patient's level of consciousness and physiological / cardiorespiratory status by the radiology RN, with a total moderate sedation time of 21 minutes. PROCEDURE: The procedure, risks, benefits, and alternatives were explained to the patient. Questions regarding the procedure were encouraged and answered. The patient understands and consents to the procedure. Patient placed right lateral decubitus. Select axial scans through the pelvis were obtained. The collection was localized and an appropriate skin entry site was determined and marked. The operative field was prepped with chlorhexidinein a sterile fashion, and a sterile drape was applied covering the operative field. A sterile gown and  sterile gloves were used for the procedure. Local anesthesia was provided with 1%  Lidocaine. Under CT fluoroscopic guidance, 18 gauge trocar needle advanced into the collection. An Amplatz guidewire advanced easily within the collection. Tract dilated to allow placement of a 12 French pigtail drain catheter, formed centrally within the collection. Approximately 5 mL of bloody slightly purulent material were aspirated, sent for Gram stain and culture. The catheter was secured externally with 0 Prolene suture and StatLock and placed to gravity drain bag. The patient tolerated the procedure well. COMPLICATIONS: None immediate FINDINGS: Left pelvic gas and fluid collection was localized. 12 French pigtail drain catheter placed as above. 5 mL bloody aspirate was sent for Gram stain and culture. IMPRESSION: Technically successful CT-guided pelvic abscess drain catheter placement. Electronically Signed   By: Lucrezia Europe M.D.   On: 10/07/2019 15:13    Anti-infectives: Anti-infectives (From admission, onward)   Start     Dose/Rate Route Frequency Ordered Stop   10/07/19 1400  piperacillin-tazobactam (ZOSYN) IVPB 2.25 g     Discontinue     2.25 g 100 mL/hr over 30 Minutes Intravenous Every 8 hours 10/07/19 1137     09/20/2019 2230  piperacillin-tazobactam (ZOSYN) IVPB 3.375 g  Status:  Discontinued        3.375 g 100 mL/hr over 30 Minutes Intravenous Every 8 hours 09/26/2019 2212 10/07/19 1137   09/26/2019 1900  piperacillin-tazobactam (ZOSYN) IVPB 3.375 g  Status:  Discontinued        3.375 g 100 mL/hr over 30 Minutes Intravenous  Once 09/29/2019 1846 09/20/2019 2218       Assessment/Plan Atrial fibrillation -on Xarelto-last dose 10/04/2019 Acute on chronic CKD - Cr 2.32 from 2.75 CHF AICD Abdominal aortic aneurysm Peripheral vascular disease COPD on home oxygen Hypertension Hypothyroid Hyperlipidemia GERD - put back on home pepcid  Perforated sigmoid diverticulitis with 2 intra-abdominal abscess - s/p IR drain placement 7/25 - drain with minimal serous looking fluid, cxs pending -  WBC 8 from 11, afebrile - ok to advance to FLD - add probiotic to try to help with diarrhea  FEN: FLD, IVF VTE: SCDs ID: Zosyn 7/24>>  LOS: 2 days    Norm Parcel , [facility] 10/08/2019, 8:35 AM Please see Amion for pager number during day hours 7:00am-4:30pm

## 2019-10-08 NOTE — Progress Notes (Signed)
Wound care provided to all sites per orders.  Patient tolerated well.  Will continue to monitor.

## 2019-10-08 NOTE — Consult Note (Addendum)
WOC Nurse Consult Note: Patient receiving care in [place].  Wounds on dorsal surface of feet are result of a fall some weeks ago. Reason for Consult: BLE wounds Wound type: traumatic injury to bilateral feet Pressure Injury POA: Yes/No/NA Measurement: R foot wound measures 3.2 cm x 1.4 cm, wound bed is yellowish, no drainage, no induration, erythema, or odor.  Left 1st metatarsal head wound measures 0.3 cm x 0.4 cm and is white in color, no odor, no induration, no erythema. Wound bed: Drainage (amount, consistency, odor)  Periwound: intact, feet are cool to touch Dressing procedure/placement/frequency: Bedside nurse to perform. Wash wounds on dorsal surface of bilateral feet with soap and water. Pat dry.  Place a small piece of Xeroform Kellie Simmering [phone]) over each wound, top with a small foam dressing.  Change daily. Monitor the wound area(s) for worsening of condition such as: Signs/symptoms of infection,  Increase in size,  Development of or worsening of odor, Development of pain, or increased pain at the affected locations.  Notify the medical team if any of these develop.  Thank you for the consult.  Discussed plan of care with the patient.  [place] nurse will not follow at this time.  Please re-consult the [place] team if needed.  Val Riles, RN, MSN, CWOCN, CNS-BC, pager [phone]

## 2019-10-09 ENCOUNTER — Inpatient Hospital Stay (HOSPITAL_COMMUNITY): Payer: Medicare Other

## 2019-10-09 ENCOUNTER — Encounter (HOSPITAL_COMMUNITY): Payer: Self-pay | Admitting: Family Medicine

## 2019-10-09 ENCOUNTER — Inpatient Hospital Stay (HOSPITAL_COMMUNITY): Payer: Medicare Other | Admitting: Certified Registered Nurse Anesthetist

## 2019-10-09 ENCOUNTER — Encounter (HOSPITAL_COMMUNITY): Admission: EM | Disposition: E | Payer: Self-pay | Source: Home / Self Care | Attending: Pulmonary Disease

## 2019-10-09 ENCOUNTER — Encounter (HOSPITAL_BASED_OUTPATIENT_CLINIC_OR_DEPARTMENT_OTHER): Payer: Medicare Other | Admitting: Internal Medicine

## 2019-10-09 ENCOUNTER — Encounter (HOSPITAL_COMMUNITY): Payer: Medicare Other

## 2019-10-09 DIAGNOSIS — K5780 Diverticulitis of intestine, part unspecified, with perforation and abscess without bleeding: Secondary | ICD-10-CM

## 2019-10-09 HISTORY — PX: LAPAROTOMY: SHX154

## 2019-10-09 HISTORY — PX: DRAIN REMOVAL: SHX6697

## 2019-10-09 HISTORY — PX: INCISION AND DRAINAGE ABSCESS: SHX5864

## 2019-10-09 LAB — POCT I-STAT 7, (LYTES, BLD GAS, ICA,H+H)
Acid-base deficit: 8 mmol/L — ABNORMAL HIGH (ref 0.0–2.0)
Acid-base deficit: 16 mmol/L — ABNORMAL HIGH (ref 0.0–2.0)
Acid-base deficit: 16 mmol/L — ABNORMAL HIGH (ref 0.0–2.0)
Bicarbonate: 18.4 mmol/L — ABNORMAL LOW (ref 20.0–28.0)
Bicarbonate: 13.5 mmol/L — ABNORMAL LOW (ref 20.0–28.0)
Bicarbonate: 14.9 mmol/L — ABNORMAL LOW (ref 20.0–28.0)
Calcium, Ion: 0.78 mmol/L — CL (ref 1.15–1.40)
Calcium, Ion: 0.72 mmol/L — CL (ref 1.15–1.40)
Calcium, Ion: 1.15 mmol/L (ref 1.15–1.40)
HCT: 24 % — ABNORMAL LOW (ref 36.0–46.0)
HCT: 20 % — ABNORMAL LOW (ref 36.0–46.0)
HCT: 25 % — ABNORMAL LOW (ref 36.0–46.0)
Hemoglobin: 8.2 g/dL — ABNORMAL LOW (ref 12.0–15.0)
Hemoglobin: 6.8 g/dL — CL (ref 12.0–15.0)
Hemoglobin: 8.5 g/dL — ABNORMAL LOW (ref 12.0–15.0)
O2 Saturation: 99 %
O2 Saturation: 93 %
O2 Saturation: 99 %
Patient temperature: 37
Patient temperature: 33.6
Patient temperature: 33.7
Potassium: 5 mmol/L (ref 3.5–5.1)
Potassium: 5.9 mmol/L — ABNORMAL HIGH (ref 3.5–5.1)
Potassium: 6.6 mmol/L (ref 3.5–5.1)
Sodium: 132 mmol/L — ABNORMAL LOW (ref 135–145)
Sodium: 133 mmol/L — ABNORMAL LOW (ref 135–145)
Sodium: 134 mmol/L — ABNORMAL LOW (ref 135–145)
TCO2: 20 mmol/L — ABNORMAL LOW (ref 22–32)
TCO2: 15 mmol/L — ABNORMAL LOW (ref 22–32)
TCO2: 17 mmol/L — ABNORMAL LOW (ref 22–32)
pCO2 arterial: 40 mmHg (ref 32.0–48.0)
pCO2 arterial: 44.9 mmHg (ref 32.0–48.0)
pCO2 arterial: 55.3 mmHg — ABNORMAL HIGH (ref 32.0–48.0)
pH, Arterial: 7.27 — ABNORMAL LOW (ref 7.350–7.450)
pH, Arterial: 7.015 — CL (ref 7.350–7.450)
pH, Arterial: 7.064 — CL (ref 7.350–7.450)
pO2, Arterial: 180 mmHg — ABNORMAL HIGH (ref 83.0–108.0)
pO2, Arterial: 200 mmHg — ABNORMAL HIGH (ref 83.0–108.0)
pO2, Arterial: 81 mmHg — ABNORMAL LOW (ref 83.0–108.0)

## 2019-10-09 LAB — CBC WITH DIFFERENTIAL/PLATELET
Abs Immature Granulocytes: 0.79 10*3/uL — ABNORMAL HIGH (ref 0.00–0.07)
Basophils Absolute: 0.1 10*3/uL (ref 0.0–0.1)
Basophils Relative: 0 %
Eosinophils Absolute: 0 10*3/uL (ref 0.0–0.5)
Eosinophils Relative: 0 %
HCT: 28.1 % — ABNORMAL LOW (ref 36.0–46.0)
Hemoglobin: 8.9 g/dL — ABNORMAL LOW (ref 12.0–15.0)
Immature Granulocytes: 5 %
Lymphocytes Relative: 8 %
Lymphs Abs: 1.4 10*3/uL (ref 0.7–4.0)
MCH: 28.1 pg (ref 26.0–34.0)
MCHC: 31.7 g/dL (ref 30.0–36.0)
MCV: 88.6 fL (ref 80.0–100.0)
Monocytes Absolute: 0.8 10*3/uL (ref 0.1–1.0)
Monocytes Relative: 5 %
Neutro Abs: 14.3 10*3/uL — ABNORMAL HIGH (ref 1.7–7.7)
Neutrophils Relative %: 82 %
Platelets: 288 10*3/uL (ref 150–400)
RBC: 3.17 MIL/uL — ABNORMAL LOW (ref 3.87–5.11)
RDW: 19.6 % — ABNORMAL HIGH (ref 11.5–15.5)
WBC: 17.3 10*3/uL — ABNORMAL HIGH (ref 4.0–10.5)
nRBC: 2.8 % — ABNORMAL HIGH (ref 0.0–0.2)

## 2019-10-09 LAB — COMPREHENSIVE METABOLIC PANEL
ALT: 37 U/L (ref 0–44)
AST: 92 U/L — ABNORMAL HIGH (ref 15–41)
Albumin: 1.8 g/dL — ABNORMAL LOW (ref 3.5–5.0)
Alkaline Phosphatase: 56 U/L (ref 38–126)
Anion gap: 18 — ABNORMAL HIGH (ref 5–15)
BUN: 45 mg/dL — ABNORMAL HIGH (ref 8–23)
CO2: 14 mmol/L — ABNORMAL LOW (ref 22–32)
Calcium: 7.2 mg/dL — ABNORMAL LOW (ref 8.9–10.3)
Chloride: 102 mmol/L (ref 98–111)
Creatinine, Ser: 2.65 mg/dL — ABNORMAL HIGH (ref 0.44–1.00)
GFR calc Af Amer: 19 mL/min — ABNORMAL LOW (ref >60)
GFR calc non Af Amer: 17 mL/min — ABNORMAL LOW (ref >60)
Glucose, Bld: 95 mg/dL (ref 70–99)
Potassium: 4.4 mmol/L (ref 3.5–5.1)
Sodium: 134 mmol/L — ABNORMAL LOW (ref 135–145)
Total Bilirubin: 0.9 mg/dL (ref 0.3–1.2)
Total Protein: 4.8 g/dL — ABNORMAL LOW (ref 6.5–8.1)

## 2019-10-09 LAB — GLUCOSE, CAPILLARY
Glucose-Capillary: 77 mg/dL (ref 70–99)
Glucose-Capillary: 92 mg/dL (ref 70–99)
Glucose-Capillary: 139 mg/dL — ABNORMAL HIGH (ref 70–99)

## 2019-10-09 LAB — CBC
HCT: 29.8 % — ABNORMAL LOW (ref 36.0–46.0)
Hemoglobin: 9.1 g/dL — ABNORMAL LOW (ref 12.0–15.0)
MCH: 27.5 pg (ref 26.0–34.0)
MCHC: 30.5 g/dL (ref 30.0–36.0)
MCV: 90 fL (ref 80.0–100.0)
Platelets: 225 10*3/uL (ref 150–400)
RBC: 3.31 MIL/uL — ABNORMAL LOW (ref 3.87–5.11)
RDW: 17.6 % — ABNORMAL HIGH (ref 11.5–15.5)
WBC: 14.5 10*3/uL — ABNORMAL HIGH (ref 4.0–10.5)
nRBC: 6.5 % — ABNORMAL HIGH (ref 0.0–0.2)

## 2019-10-09 LAB — MAGNESIUM: Magnesium: 2.5 mg/dL — ABNORMAL HIGH (ref 1.7–2.4)

## 2019-10-09 LAB — PROTIME-INR
INR: 4.7 (ref 0.8–1.2)
INR: 6.1 (ref 0.8–1.2)
Prothrombin Time: 42.9 seconds — ABNORMAL HIGH (ref 11.4–15.2)
Prothrombin Time: 52.8 seconds — ABNORMAL HIGH (ref 11.4–15.2)

## 2019-10-09 LAB — LACTIC ACID, PLASMA
Lactic Acid, Venous: 5.8 mmol/L (ref 0.5–1.9)
Lactic Acid, Venous: 5.4 mmol/L (ref 0.5–1.9)

## 2019-10-09 LAB — PREPARE RBC (CROSSMATCH)

## 2019-10-09 LAB — BASIC METABOLIC PANEL
Anion gap: 19 — ABNORMAL HIGH (ref 5–15)
BUN: 39 mg/dL — ABNORMAL HIGH (ref 8–23)
CO2: 14 mmol/L — ABNORMAL LOW (ref 22–32)
Calcium: 8.3 mg/dL — ABNORMAL LOW (ref 8.9–10.3)
Chloride: 101 mmol/L (ref 98–111)
Creatinine, Ser: 2.6 mg/dL — ABNORMAL HIGH (ref 0.44–1.00)
GFR calc Af Amer: 20 mL/min — ABNORMAL LOW (ref >60)
GFR calc non Af Amer: 17 mL/min — ABNORMAL LOW (ref >60)
Glucose, Bld: 101 mg/dL — ABNORMAL HIGH (ref 70–99)
Potassium: 4.6 mmol/L (ref 3.5–5.1)
Sodium: 134 mmol/L — ABNORMAL LOW (ref 135–145)

## 2019-10-09 LAB — PHOSPHORUS: Phosphorus: 6.5 mg/dL — ABNORMAL HIGH (ref 2.5–4.6)

## 2019-10-09 LAB — PROCALCITONIN: Procalcitonin: 1.69 ng/mL

## 2019-10-09 LAB — APTT: aPTT: 50 seconds — ABNORMAL HIGH (ref 24–36)

## 2019-10-09 SURGERY — LAPAROTOMY, EXPLORATORY
Anesthesia: General | Site: Abdomen

## 2019-10-09 MED ORDER — ALBUMIN HUMAN 5 % IV SOLN
12.5000 g | Freq: Once | INTRAVENOUS | Status: AC
  Administered 2019-10-09: 12.5 g via INTRAVENOUS
  Filled 2019-10-09: qty 250

## 2019-10-09 MED ORDER — VASOPRESSIN 20 UNIT/ML IV SOLN
INTRAVENOUS | Status: AC
  Filled 2019-10-09: qty 1

## 2019-10-09 MED ORDER — ALBUMIN HUMAN 25 % IV SOLN
50.0000 g | Freq: Once | INTRAVENOUS | Status: DC
  Filled 2019-10-09: qty 200

## 2019-10-09 MED ORDER — EPINEPHRINE 1 MG/10ML IJ SOSY
PREFILLED_SYRINGE | INTRAMUSCULAR | Status: DC | PRN
Start: 2019-10-09 — End: 2019-10-09
  Administered 2019-10-09 (×2): .2 mg via INTRAVENOUS

## 2019-10-09 MED ORDER — SODIUM CHLORIDE 0.9 % IV SOLN
2.0000 g | INTRAVENOUS | Status: DC
  Administered 2019-10-10 – 2019-10-13 (×4): 2 g via INTRAVENOUS
  Filled 2019-10-09 (×4): qty 2

## 2019-10-09 MED ORDER — VANCOMYCIN HCL 1500 MG/300ML IV SOLN
1500.0000 mg | Freq: Once | INTRAVENOUS | Status: AC
  Administered 2019-10-09: 1500 mg via INTRAVENOUS
  Filled 2019-10-09: qty 300

## 2019-10-09 MED ORDER — EPINEPHRINE 1 MG/10ML IJ SOSY
PREFILLED_SYRINGE | INTRAMUSCULAR | Status: AC
  Filled 2019-10-09: qty 10

## 2019-10-09 MED ORDER — CALCIUM CHLORIDE 10 % IV SOLN
INTRAVENOUS | Status: AC
  Filled 2019-10-09: qty 10

## 2019-10-09 MED ORDER — FUROSEMIDE 10 MG/ML IJ SOLN
INTRAMUSCULAR | Status: DC | PRN
Start: 2019-10-09 — End: 2019-10-09
  Administered 2019-10-09: 20 mg via INTRAMUSCULAR

## 2019-10-09 MED ORDER — LACTATED RINGERS IV SOLN: INTRAVENOUS | Status: DC | PRN

## 2019-10-09 MED ORDER — FENTANYL CITRATE (PF) 100 MCG/2ML IJ SOLN
12.5000 ug | Freq: Once | INTRAMUSCULAR | Status: AC
  Administered 2019-10-09: 12.5 ug via INTRAVENOUS
  Filled 2019-10-09: qty 2

## 2019-10-09 MED ORDER — NOREPINEPHRINE 4 MG/250ML-% IV SOLN
INTRAVENOUS | Status: DC | PRN
Start: 2019-10-09 — End: 2019-10-09
  Administered 2019-10-09: 5 ug/min via INTRAVENOUS

## 2019-10-09 MED ORDER — HEMOSTATIC AGENTS (NO CHARGE) OPTIME
TOPICAL | Status: DC | PRN
  Administered 2019-10-09 (×4): 1 via TOPICAL

## 2019-10-09 MED ORDER — FENTANYL CITRATE (PF) 100 MCG/2ML IJ SOLN: 25.0000 ug | Freq: Once | INTRAMUSCULAR | Status: DC

## 2019-10-09 MED ORDER — FENTANYL 2500MCG IN NS 250ML (10MCG/ML) PREMIX INFUSION
25.0000 ug/h | INTRAVENOUS | Status: DC
  Administered 2019-10-09: 25 ug/h via INTRAVENOUS
  Administered 2019-10-10: 150 ug/h via INTRAVENOUS
  Filled 2019-10-09 (×2): qty 250

## 2019-10-09 MED ORDER — LIDOCAINE 2% (20 MG/ML) 5 ML SYRINGE
INTRAMUSCULAR | Status: DC | PRN
  Administered 2019-10-09: 100 mg via INTRAVENOUS

## 2019-10-09 MED ORDER — DOCUSATE SODIUM 50 MG/5ML PO LIQD: 100.0000 mg | Freq: Two times a day (BID) | ORAL | Status: DC

## 2019-10-09 MED ORDER — SODIUM CHLORIDE 0.9% IV SOLUTION: Freq: Once | INTRAVENOUS | Status: DC

## 2019-10-09 MED ORDER — ALBUMIN HUMAN 5 % IV SOLN
INTRAVENOUS | Status: DC | PRN
Start: 2019-10-09 — End: 2019-10-09

## 2019-10-09 MED ORDER — SODIUM BICARBONATE-DEXTROSE 150-5 MEQ/L-% IV SOLN: 150.0000 meq | INTRAVENOUS | Status: DC

## 2019-10-09 MED ORDER — MIDAZOLAM HCL 2 MG/2ML IJ SOLN
INTRAMUSCULAR | Status: DC | PRN
Start: 2019-10-09 — End: 2019-10-09
  Administered 2019-10-09: 2 mg via INTRAVENOUS

## 2019-10-09 MED ORDER — ONDANSETRON HCL 4 MG/2ML IJ SOLN
INTRAMUSCULAR | Status: AC
  Filled 2019-10-09: qty 2

## 2019-10-09 MED ORDER — PHENYLEPHRINE HCL-NACL 10-0.9 MG/250ML-% IV SOLN
INTRAVENOUS | Status: DC | PRN
Start: 2019-10-09 — End: 2019-10-09
  Administered 2019-10-09: 100 ug/min via INTRAVENOUS

## 2019-10-09 MED ORDER — 0.9 % SODIUM CHLORIDE (POUR BTL) OPTIME
TOPICAL | Status: DC | PRN
  Administered 2019-10-09 (×3): 1000 mL

## 2019-10-09 MED ORDER — MIDAZOLAM HCL 2 MG/2ML IJ SOLN
INTRAMUSCULAR | Status: AC
  Filled 2019-10-09: qty 2

## 2019-10-09 MED ORDER — PHENYLEPHRINE 40 MCG/ML (10ML) SYRINGE FOR IV PUSH (FOR BLOOD PRESSURE SUPPORT)
PREFILLED_SYRINGE | INTRAVENOUS | Status: DC | PRN
  Administered 2019-10-09: 80 ug via INTRAVENOUS
  Administered 2019-10-09: 120 ug via INTRAVENOUS
  Administered 2019-10-09: 200 ug via INTRAVENOUS

## 2019-10-09 MED ORDER — NOREPINEPHRINE 16 MG/250ML-% IV SOLN
0.0000 ug/min | INTRAVENOUS | Status: DC
  Administered 2019-10-09: 2 ug/min via INTRAVENOUS
  Filled 2019-10-09: qty 250

## 2019-10-09 MED ORDER — CALCIUM CHLORIDE 10 % IV SOLN
INTRAVENOUS | Status: DC | PRN
Start: 2019-10-09 — End: 2019-10-09
  Administered 2019-10-09: 500 mg via INTRAVENOUS
  Administered 2019-10-09: 400 mg via INTRAVENOUS
  Administered 2019-10-09: 200 mg via INTRAVENOUS
  Administered 2019-10-09: 300 mg via INTRAVENOUS
  Administered 2019-10-09: 200 mg via INTRAVENOUS
  Administered 2019-10-09: 400 mg via INTRAVENOUS

## 2019-10-09 MED ORDER — METRONIDAZOLE IN NACL 5-0.79 MG/ML-% IV SOLN
500.0000 mg | Freq: Three times a day (TID) | INTRAVENOUS | Status: DC
  Administered 2019-10-09 – 2019-10-13 (×13): 500 mg via INTRAVENOUS
  Filled 2019-10-09 (×13): qty 100

## 2019-10-09 MED ORDER — FENTANYL CITRATE (PF) 250 MCG/5ML IJ SOLN
INTRAMUSCULAR | Status: DC | PRN
  Administered 2019-10-09: 100 ug via INTRAVENOUS

## 2019-10-09 MED ORDER — PROPOFOL 1000 MG/100ML IV EMUL: 0.0000 ug/kg/min | INTRAVENOUS | Status: DC

## 2019-10-09 MED ORDER — ROCURONIUM BROMIDE 10 MG/ML (PF) SYRINGE
PREFILLED_SYRINGE | INTRAVENOUS | Status: DC | PRN
  Administered 2019-10-09: 50 mg via INTRAVENOUS

## 2019-10-09 MED ORDER — PANTOPRAZOLE SODIUM 40 MG IV SOLR
40.0000 mg | INTRAVENOUS | Status: DC
  Administered 2019-10-09 – 2019-10-12 (×4): 40 mg via INTRAVENOUS
  Filled 2019-10-09 (×4): qty 40

## 2019-10-09 MED ORDER — LACTATED RINGERS IV BOLUS (SEPSIS)
1000.0000 mL | Freq: Once | INTRAVENOUS | Status: AC
  Administered 2019-10-09: 1000 mL via INTRAVENOUS

## 2019-10-09 MED ORDER — ETOMIDATE 2 MG/ML IV SOLN
INTRAVENOUS | Status: AC
  Filled 2019-10-09: qty 10

## 2019-10-09 MED ORDER — VASOPRESSIN 20 UNIT/ML IV SOLN
INTRAVENOUS | Status: DC | PRN
  Administered 2019-10-09: 2 [IU] via INTRAVENOUS
  Administered 2019-10-09: 3 [IU] via INTRAVENOUS
  Administered 2019-10-09 (×2): 1 [IU] via INTRAVENOUS
  Administered 2019-10-09: 2 [IU] via INTRAVENOUS
  Administered 2019-10-09: 1 [IU] via INTRAVENOUS
  Administered 2019-10-09: 3 [IU] via INTRAVENOUS

## 2019-10-09 MED ORDER — VASOPRESSIN 20 UNIT/ML IV SOLN
INTRAVENOUS | Status: DC | PRN
  Administered 2019-10-09: .02 [IU]/min via INTRAVENOUS
  Administered 2019-10-09: .05 [IU]/min via INTRAVENOUS

## 2019-10-09 MED ORDER — LEVOTHYROXINE SODIUM 100 MCG/5ML IV SOLN
62.5000 ug | Freq: Every day | INTRAVENOUS | Status: DC
  Administered 2019-10-12: 62.5 ug via INTRAVENOUS
  Filled 2019-10-09 (×2): qty 5

## 2019-10-09 MED ORDER — FENTANYL BOLUS VIA INFUSION
25.0000 ug | INTRAVENOUS | Status: DC | PRN
  Administered 2019-10-09 – 2019-10-11 (×10): 25 ug via INTRAVENOUS
  Filled 2019-10-09: qty 25

## 2019-10-09 MED ORDER — SUCCINYLCHOLINE CHLORIDE 200 MG/10ML IV SOSY
PREFILLED_SYRINGE | INTRAVENOUS | Status: DC | PRN
  Administered 2019-10-09: 120 mg via INTRAVENOUS

## 2019-10-09 MED ORDER — VANCOMYCIN HCL IN DEXTROSE 1-5 GM/200ML-% IV SOLN: 1000.0000 mg | INTRAVENOUS | Status: DC

## 2019-10-09 MED ORDER — DEXAMETHASONE SODIUM PHOSPHATE 10 MG/ML IJ SOLN
INTRAMUSCULAR | Status: AC
  Filled 2019-10-09: qty 1

## 2019-10-09 MED ORDER — DEXAMETHASONE SODIUM PHOSPHATE 10 MG/ML IJ SOLN
INTRAMUSCULAR | Status: DC | PRN
  Administered 2019-10-09: 10 mg via INTRAVENOUS

## 2019-10-09 MED ORDER — VITAMIN K1 10 MG/ML IJ SOLN
10.0000 mg | Freq: Once | INTRAVENOUS | Status: DC
  Filled 2019-10-09 (×2): qty 1

## 2019-10-09 MED ORDER — LIDOCAINE 2% (20 MG/ML) 5 ML SYRINGE
INTRAMUSCULAR | Status: AC
  Filled 2019-10-09: qty 5

## 2019-10-09 MED ORDER — SODIUM BICARBONATE 8.4 % IV SOLN
INTRAVENOUS | Status: DC
  Administered 2019-10-09: 75 mL/h via INTRAVENOUS
  Administered 2019-10-09: 150 mL/h via INTRAVENOUS
  Filled 2019-10-09 (×6): qty 150

## 2019-10-09 MED ORDER — SODIUM BICARBONATE 8.4 % IV SOLN
INTRAVENOUS | Status: DC | PRN
  Administered 2019-10-09: 25 meq via INTRAVENOUS

## 2019-10-09 MED ORDER — DOCUSATE SODIUM 50 MG/5ML PO LIQD: 100.0000 mg | Freq: Every day | ORAL | Status: DC | PRN

## 2019-10-09 MED ORDER — SODIUM CHLORIDE 0.9 % IV SOLN: INTRAVENOUS | Status: DC | PRN

## 2019-10-09 MED ORDER — FENTANYL CITRATE (PF) 100 MCG/2ML IJ SOLN: 25.0000 ug | INTRAMUSCULAR | Status: DC | PRN

## 2019-10-09 MED ORDER — SODIUM CHLORIDE 0.9 % IV BOLUS
500.0000 mL | Freq: Once | INTRAVENOUS | Status: AC
  Administered 2019-10-09: 500 mL via INTRAVENOUS

## 2019-10-09 MED ORDER — CHLORHEXIDINE GLUCONATE CLOTH 2 % EX PADS
6.0000 | MEDICATED_PAD | Freq: Every day | CUTANEOUS | Status: DC
  Administered 2019-10-09 – 2019-10-13 (×4): 6 via TOPICAL

## 2019-10-09 MED ORDER — SODIUM CHLORIDE 0.9 % IV SOLN
2.0000 g | Freq: Once | INTRAVENOUS | Status: AC
  Administered 2019-10-09: 2 g via INTRAVENOUS
  Filled 2019-10-09: qty 2

## 2019-10-09 MED ORDER — ETOMIDATE 2 MG/ML IV SOLN
INTRAVENOUS | Status: DC | PRN
  Administered 2019-10-09: 16 mg via INTRAVENOUS

## 2019-10-09 MED ORDER — FUROSEMIDE 10 MG/ML IJ SOLN
INTRAMUSCULAR | Status: AC
  Filled 2019-10-09: qty 4

## 2019-10-09 MED ORDER — FENTANYL CITRATE (PF) 250 MCG/5ML IJ SOLN
INTRAMUSCULAR | Status: AC
  Filled 2019-10-09: qty 5

## 2019-10-09 MED ORDER — VASOPRESSIN 20 UNITS/100 ML INFUSION FOR SHOCK
0.0000 [IU]/min | INTRAVENOUS | Status: DC
  Administered 2019-10-09 – 2019-10-10 (×4): 0.03 [IU]/min via INTRAVENOUS
  Filled 2019-10-09 (×4): qty 100

## 2019-10-09 SURGICAL SUPPLY — 50 items
BLADE CLIPPER SURG (BLADE) IMPLANT
BNDG GAUZE ELAST 4 BULKY (GAUZE/BANDAGES/DRESSINGS) ×2 IMPLANT
CANISTER SUCT 3000ML PPV (MISCELLANEOUS) ×4 IMPLANT
COVER SURGICAL LIGHT HANDLE (MISCELLANEOUS) ×4 IMPLANT
COVER WAND RF STERILE (DRAPES) ×2 IMPLANT
DRAIN CHANNEL 19F RND (DRAIN) ×2 IMPLANT
DRAPE LAPAROSCOPIC ABDOMINAL (DRAPES) ×4 IMPLANT
DRAPE WARM FLUID 44X44 (DRAPES) ×4 IMPLANT
DRSG OPSITE POSTOP 4X10 (GAUZE/BANDAGES/DRESSINGS) IMPLANT
DRSG OPSITE POSTOP 4X8 (GAUZE/BANDAGES/DRESSINGS) IMPLANT
DRSG PAD ABDOMINAL 8X10 ST (GAUZE/BANDAGES/DRESSINGS) ×2 IMPLANT
ELECT BLADE 6.5 EXT (BLADE) IMPLANT
ELECT CAUTERY BLADE 6.4 (BLADE) ×4 IMPLANT
ELECT REM PT RETURN 9FT ADLT (ELECTROSURGICAL) ×4
ELECTRODE REM PT RTRN 9FT ADLT (ELECTROSURGICAL) ×2 IMPLANT
EVACUATOR SILICONE 100CC (DRAIN) ×2 IMPLANT
GAUZE SPONGE 4X4 12PLY STRL (GAUZE/BANDAGES/DRESSINGS) ×2 IMPLANT
GLOVE SURG SIGNA 7.5 PF LTX (GLOVE) ×4 IMPLANT
GOWN STRL REUS W/ TWL LRG LVL3 (GOWN DISPOSABLE) ×2 IMPLANT
GOWN STRL REUS W/ TWL XL LVL3 (GOWN DISPOSABLE) ×2 IMPLANT
GOWN STRL REUS W/TWL LRG LVL3 (GOWN DISPOSABLE) ×4
GOWN STRL REUS W/TWL XL LVL3 (GOWN DISPOSABLE) ×4
HANDLE SUCTION POOLE (INSTRUMENTS) ×2 IMPLANT
KIT BASIN OR (CUSTOM PROCEDURE TRAY) ×4 IMPLANT
KIT OSTOMY DRAINABLE 2.75 STR (WOUND CARE) ×2 IMPLANT
KIT TURNOVER KIT B (KITS) ×4 IMPLANT
LIGASURE IMPACT 36 18CM CVD LR (INSTRUMENTS) ×2 IMPLANT
NS IRRIG 1000ML POUR BTL (IV SOLUTION) ×8 IMPLANT
PACK GENERAL/GYN (CUSTOM PROCEDURE TRAY) ×4 IMPLANT
PAD ARMBOARD 7.5X6 YLW CONV (MISCELLANEOUS) ×4 IMPLANT
PENCIL SMOKE EVACUATOR (MISCELLANEOUS) ×4 IMPLANT
SLEEVE SUCTION CATH 165 (SLEEVE) ×2 IMPLANT
SPECIMEN JAR LARGE (MISCELLANEOUS) IMPLANT
SPONGE LAP 18X18 RF (DISPOSABLE) ×2 IMPLANT
STAPLER CUT CVD 40MM BLUE (STAPLE) ×2 IMPLANT
STAPLER PROXIMATE 75MM BLUE (STAPLE) ×2 IMPLANT
STAPLER VISISTAT 35W (STAPLE) ×4 IMPLANT
SUCTION POOLE HANDLE (INSTRUMENTS) ×4
SUT ETHILON 3 0 FSL (SUTURE) ×2 IMPLANT
SUT PDS AB 1 TP1 96 (SUTURE) ×8 IMPLANT
SUT PROLENE 2 0 CT2 30 (SUTURE) ×2 IMPLANT
SUT SILK 2 0 SH CR/8 (SUTURE) ×4 IMPLANT
SUT SILK 2 0 TIES 10X30 (SUTURE) ×4 IMPLANT
SUT SILK 3 0 SH CR/8 (SUTURE) ×4 IMPLANT
SUT SILK 3 0 TIES 10X30 (SUTURE) ×4 IMPLANT
SUT VIC AB 3-0 SH 18 (SUTURE) ×2 IMPLANT
TOWEL GREEN STERILE (TOWEL DISPOSABLE) ×4 IMPLANT
TOWEL GREEN STERILE FF (TOWEL DISPOSABLE) ×4 IMPLANT
TRAY FOLEY MTR SLVR 16FR STAT (SET/KITS/TRAYS/PACK) ×2 IMPLANT
YANKAUER SUCT BULB TIP NO VENT (SUCTIONS) ×2 IMPLANT

## 2019-10-09 NOTE — Progress Notes (Signed)
RT note- ETT withdrawn 2cm per Dr. Halford Chessman, now ETT at 21 at the teeth, BBS equal, clear.

## 2019-10-09 NOTE — Progress Notes (Signed)
Pts vitals and labs concerning starting HS. 1.5 L bolus given overnight along w albumin.  This AM  Day shift MD notified of events and interventions. IV team consulted for new access see their note. Gen. surg at bedside and they see reason to obtain stat CT and send to OR urgently. Pt to transfer to 4N after OR.   Daughter called and updated, see is going to meet pt in OR area to ensure consent is signed because pt wanted to speak w her first before undergoing anything.   MD ordered Albumin again this AM but it had not arrived by the time CT came to take pt. Pt meds and belongings transferred to 4N.   Brief report called to short stay OR and 4N was given my number to call for report when they are ready.

## 2019-10-09 NOTE — Progress Notes (Signed)
Dr. Olevia Bowens notified of critical INR 4.7

## 2019-10-09 NOTE — Anesthesia Procedure Notes (Signed)
Procedure Name: Intubation Date/Time: 09/15/2019 10:58 AM Performed by: Harden Mo, CRNA Pre-anesthesia Checklist: Patient identified, Emergency Drugs available, Suction available and Patient being monitored Patient Re-evaluated:Patient Re-evaluated prior to induction Oxygen Delivery Method: Circle System Utilized Preoxygenation: Pre-oxygenation with 100% oxygen Induction Type: IV induction, Rapid sequence and Cricoid Pressure applied Laryngoscope Size: Miller and 2 Grade View: Grade I Tube type: Oral Tube size: 7.5 mm Number of attempts: 1 Airway Equipment and Method: Stylet and Oral airway Placement Confirmation: ETT inserted through vocal cords under direct vision,  positive ETCO2 and breath sounds checked- equal and bilateral Secured at: 22 cm Tube secured with: Tape Dental Injury: Teeth and Oropharynx as per pre-operative assessment

## 2019-10-09 NOTE — Progress Notes (Signed)
VAST consulted to obtain 2nd IV access. Pt with reported unstable, low blood pressures throughout night. Pt has restricted armband in place on left arm; she reported she did not have lymph node removal with L breast biopsy. She reported her left arm can't be used because of defibrillator placement (placed in 2019).  Right arm assessed utilizing [place]; no appropriate veins visualized in lower arm; able to place 85R IV in cephalic vein of upper right arm.  D/t suspected infection and unstable blood pressures, patient is currently not a candidate for PICC placement.  If further IV access is needed, a midline might be placed in upper right arm if appropriate vessels are visualized with [place]; unable to assess d/t pt leaving for CT scan.

## 2019-10-09 NOTE — Progress Notes (Addendum)
TRH night shift.  The patient's blood pressure decreased to 73/49 mmHg.  The rest of the vital signs are within normal range with a pulse of 70 bpm.  She is hypoalbuminemic with an albumin of 1.9 g/dL.  A 500 mL NaCl bolus over 2 hours along with 12.5 g IVPB of albumin ordered.  I have asked the staff to hold tramadol for now.  We will continue to monitor the blood pressure.  If pain becomes significant, will use low-dose IV fentanyl.  Addendum @0515 : The patient's blood pressure continues to be low despite earlier bolus and albumin.  Recently resulted morning labs show that her WBC has risen to 17.3 from 8.3 yesterday.  Her AKI has worsened and her creatinine is now 2.65 mg/dL.  I will obtain a lactic acid level, blood cultures, give a liter of LR bolus and change antibiotic coverage to cefepime, Flagyl and vancomycin.  Tennis Must, MD.

## 2019-10-09 NOTE — Significant Event (Addendum)
Rapid Response Note- Not a rapid response event  Overview: Called:  0805 Arrived:  0810 Departed: 16 Called for pt having transfer orders to PCU, PCU requested rapid response to evaluate.  Initial Focused Assessment: Pt lying in bed. Awake, oriented to person, place, and situation- disoriented to time. Pt is able to follow commands and moves all extremities. Extremities are cool to touch. Lung sounds are clear, diminished. Bowel sounds are faint. Abdomen is soft, firmer around drain site in left lower quadrant. IV fluids at 125 mL/hr. Per RN, pt received Albumin and 1.5L IVF bolus overnight.   VS: BP 80/50 (manual), HR 70, RR 16, SpO2 93% on room air  Interventions: -No intervention from RR RN -Check rectal temperature -CT ready for pt - transport at bedside  Plan of Care (if not transferred): -Plan for pt to have CT abdomen/pelvis then to OR for  for exploratory laparotomy -Transfer to PCU following OR, unless otherwise specified  Casimer Bilis

## 2019-10-09 NOTE — Anesthesia Procedure Notes (Signed)
Central Venous Catheter Insertion Performed by: Myrtie Soman, MD, anesthesiologist Start/End07/14/2021 11:35 AM, 10/09/2019 11:45 AM Patient location: Pre-op. Preanesthetic checklist: patient identified, IV checked, site marked, risks and benefits discussed, surgical consent, monitors and equipment checked, pre-op evaluation, timeout performed and anesthesia consent Lidocaine 1% used for infiltration and patient sedated Hand hygiene performed  and maximum sterile barriers used  Catheter size: 8 Fr Total catheter length 16. Central line was placed.Double lumen Procedure performed using ultrasound guided technique. Ultrasound Notes:anatomy identified, needle tip was noted to be adjacent to the nerve/plexus identified, no ultrasound evidence of intravascular and/or intraneural injection and image(s) printed for medical record Attempts: 1 Following insertion, dressing applied, line sutured and Biopatch. Post procedure assessment: blood return through all ports  Patient tolerated the procedure well with no immediate complications.

## 2019-10-09 NOTE — Anesthesia Procedure Notes (Signed)
Anesthesia Procedure Image    

## 2019-10-09 NOTE — Progress Notes (Signed)
Dr. Sherral Hammers notified of lactic acid 5.8

## 2019-10-09 NOTE — Anesthesia Preprocedure Evaluation (Addendum)
Anesthesia Evaluation  Patient identified by MRN, date of birth, ID band Patient awake  General Assessment Comment:Patient hypotensive, pale, and cool to touch. Answers questions appropriately  Reviewed: Allergy & Precautions, NPO status , Patient's Chart, lab work & pertinent test results  Airway Mallampati: II  TM Distance: >3 FB Neck ROM: Full    Dental no notable dental hx.    Pulmonary neg pulmonary ROS, former smoker,    Pulmonary exam normal breath sounds clear to auscultation + decreased breath sounds      Cardiovascular hypertension, +CHF  + Cardiac Defibrillator + Valvular Problems/Murmurs AI  Rhythm:Regular Rate:Normal + Systolic murmurs 1. Left ventricular ejection fraction, by estimation, is 60 to 65%. The  left ventricle has normal function. The left ventricle has no regional  wall motion abnormalities. There is moderate left ventricular hypertrophy.  Left ventricular diastolic  parameters were normal.  2. Right ventricular systolic function is normal. The right ventricular  size is normal. There is severely elevated pulmonary artery systolic  pressure.  3. Left atrial size was mildly dilated.  4. The mitral valve is grossly normal. No evidence of mitral valve  regurgitation.  5. The aortic valve is tricuspid. Aortic valve regurgitation is mild.  Mild aortic valve stenosis. Aortic valve area, by VTI measures 1.52 cm.  Aortic valve mean gradient measures 11.5 mmHg. Aortic valve Vmax measures  2.50 m/s.  6. The inferior vena cava is dilated in size with <50% respiratory  variability, suggesting right atrial pressure of 15 mmHg.   Comparison(s): Changes from prior study are noted. LVEF 55-60%, mild AS -  mean gradient 10 mmHg.   FINDINGS  Left Ventricle: Left ventricular ejection fraction, by estimation, is 60  to 65%. The left ventricle has normal function. The left ventricle has no  regional wall  motion abnormalities. The left ventricular internal cavity  size was normal in size. There is  moderate left ventricular hypertrophy. Left ventricular diastolic  parameters were normal.   Right Ventricle: The right ventricular size is normal. No increase in  right ventricular wall thickness. Right ventricular systolic function is  normal. There is severely elevated pulmonary artery systolic pressure. The  tricuspid regurgitant velocity is  3.92 m/s, and with an assumed right atrial pressure of 15 mmHg, the  estimated right ventricular systolic pressure is 77.4 mmHg.   Left Atrium: Left atrial size was mildly dilated.   Right Atrium: Right atrial size was normal in size.   Pericardium: There is no evidence of pericardial effusion.   Mitral Valve: The mitral valve is grossly normal. No evidence of mitral  valve regurgitation.   Tricuspid Valve: The tricuspid valve is grossly normal. Tricuspid valve  regurgitation is mild.   Aortic Valve: The aortic valve is tricuspid. Aortic valve regurgitation is  mild. Aortic regurgitation PHT measures 533 msec. Mild aortic stenosis is  present. Aortic valve mean gradient measures 11.5 mmHg. Aortic valve peak  gradient measures 25.0 mmHg.  Aortic valve area, by VTI measures 1.52 cm.   Pulmonic Valve: The pulmonic valve was grossly normal. Pulmonic valve  regurgitation is trivial.   Aorta: The aortic root and ascending aorta are structurally normal, with  no evidence of dilitation.   Venous: The inferior vena cava is dilated in size with less than 50%  respiratory variability, suggesting right atrial pressure of 15 mmHg.   IAS/Shunts: No atrial level shunt detected by color flow Doppler.   Additional Comments: A pacer wire is visualized.    Neuro/Psych negative  neurological ROS  negative psych ROS   GI/Hepatic Neg liver ROS, GERD  ,  Endo/Other  Hypothyroidism   Renal/GU Renal InsufficiencyRenal disease  negative genitourinary    Musculoskeletal negative musculoskeletal ROS (+)   Abdominal   Peds negative pediatric ROS (+)  Hematology  (+) anemia ,   Anesthesia Other Findings   Reproductive/Obstetrics negative OB ROS                            Anesthesia Physical Anesthesia Plan  ASA: IV  Anesthesia Plan: General   Post-op Pain Management:    Induction: Intravenous, Rapid sequence and Cricoid pressure planned  PONV Risk Score and Plan: 3 and Ondansetron and Dexamethasone  Airway Management Planned: Oral ETT  Additional Equipment: Arterial line, CVP and Ultrasound Guidance Line Placement  Intra-op Plan:   Post-operative Plan: Possible Post-op intubation/ventilation  Informed Consent: I have reviewed the patients History and Physical, chart, labs and discussed the procedure including the risks, benefits and alternatives for the proposed anesthesia with the patient or authorized representative who has indicated his/her understanding and acceptance.     Dental advisory given  Plan Discussed with: CRNA and Surgeon  Anesthesia Plan Comments:        Anesthesia Quick Evaluation

## 2019-10-09 NOTE — Progress Notes (Signed)
[facility] Progress Note     Subjective: Patient hypotensive overnight with SBP in the 70s. WBC elevated from 8 yesterday to 17 this AM. Lactic elevated. Patient complaining more of back pain than abdominal pain but also complaining of nausea.   Objective: Vital signs in last 24 hours: Temp:  [98.3 F (36.8 C)] 98.3 F (36.8 C) (07/26 1539) Pulse Rate:  [69-70] 70 (07/27 0610) Resp:  [16-20] 16 (07/27 0610) BP: (73-98)/(43-57) 82/49 (07/27 0610) SpO2:  [68 %-97 %] 90 % (07/27 0610) Last BM Date: 10/07/19  Intake/Output from previous day: 07/26 0701 - 07/27 0700 In: 2041.9 [P.O.:720; I.V.:583.6; IV Piggyback:693.3] Out: 2130 [Urine:2100; Drains:30] Intake/Output this shift: No intake/output data recorded.  PE: General: pleasant, WD, chronically ill appearing female who is in bed and appears pale  Heart: regular, rate, and rhythm.  Palpable radial and pedal pulses bilaterally Lungs: CTAB, no wheezes, rhonchi, or rales noted.  Respiratory effort nonlabored Abd: not rigid, NT, more distended, +BS, drain with feculent appearing material in tubing    Lab Results:  Recent Labs    10/08/19 0444 10/04/2019 0402  WBC 8.3 17.3*  HGB 8.2* 8.9*  HCT 26.6* 28.1*  PLT 290 288   BMET Recent Labs    10/08/19 0444 10/03/2019 0402  NA 136 134*  K 4.0 4.4  CL 106 102  CO2 16* 14*  GLUCOSE 78 95  BUN 45* 45*  CREATININE 2.32* 2.65*  CALCIUM 7.4* 7.2*   PT/INR Recent Labs    09/27/2019 0532  LABPROT 42.9*  INR 4.7*   CMP     Component Value Date/Time   NA 134 (L) 09/19/2019 0402   NA 141 09/05/2018 1107   K 4.4 09/15/2019 0402   CL 102 10/05/2019 0402   CO2 14 (L) 09/19/2019 0402   GLUCOSE 95 10/10/2019 0402   BUN 45 (H) 10/10/2019 0402   BUN 23 09/05/2018 1107   CREATININE 2.65 (H) 09/20/2019 0402   CREATININE 2.83 (H) 10/05/2019 1134   CALCIUM 7.2 (L) 10/12/2019 0402   PROT 4.8 (L) 10/12/2019 0402   ALBUMIN 1.8 (L) 09/18/2019 0402   AST 92 (H)  09/29/2019 0402   ALT 37 10/04/2019 0402   ALKPHOS 56 10/02/2019 0402   BILITOT 0.9 10/01/2019 0402   GFRNONAA 17 (L) 09/24/2019 0402   GFRNONAA 15 (L) 10/05/2019 1134   GFRAA 19 (L) 09/17/2019 0402   GFRAA 18 (L) 10/05/2019 1134   Lipase     Component Value Date/Time   LIPASE 33 05/23/2018 0203       Studies/Results: CT IMAGE GUIDED DRAINAGE BY PERCUTANEOUS CATHETER  Result Date: 10/07/2019 CLINICAL DATA:  Pelvic abscess, presumed diverticular origin EXAM: CT GUIDED DRAINAGE OF PELVIC ABSCESS ANESTHESIA/SEDATION: Intravenous Fentanyl 35mcg and Versed 2mg  were administered as conscious sedation during continuous monitoring of the patient's level of consciousness and physiological / cardiorespiratory status by the radiology RN, with a total moderate sedation time of 21 minutes. PROCEDURE: The procedure, risks, benefits, and alternatives were explained to the patient. Questions regarding the procedure were encouraged and answered. The patient understands and consents to the procedure. Patient placed right lateral decubitus. Select axial scans through the pelvis were obtained. The collection was localized and an appropriate skin entry site was determined and marked. The operative field was prepped with chlorhexidinein a sterile fashion, and a sterile drape was applied covering the operative field. A sterile gown and sterile gloves were used for the procedure. Local anesthesia was provided with 1% Lidocaine. Under  CT fluoroscopic guidance, 18 gauge trocar needle advanced into the collection. An Amplatz guidewire advanced easily within the collection. Tract dilated to allow placement of a 12 French pigtail drain catheter, formed centrally within the collection. Approximately 5 mL of bloody slightly purulent material were aspirated, sent for Gram stain and culture. The catheter was secured externally with 0 Prolene suture and StatLock and placed to gravity drain bag. The patient tolerated the  procedure well. COMPLICATIONS: None immediate FINDINGS: Left pelvic gas and fluid collection was localized. 12 French pigtail drain catheter placed as above. 5 mL bloody aspirate was sent for Gram stain and culture. IMPRESSION: Technically successful CT-guided pelvic abscess drain catheter placement. Electronically Signed   By: Lucrezia Europe M.D.   On: 10/07/2019 15:13    Anti-infectives: Anti-infectives (From admission, onward)   Start     Dose/Rate Route Frequency Ordered Stop   10/11/19 0400  vancomycin (VANCOCIN) IVPB 1000 mg/200 mL premix     Discontinue     1,000 mg 200 mL/hr over 60 Minutes Intravenous Every 48 hours 09/26/2019 0535     10/10/19 0600  ceFEPIme (MAXIPIME) 2 g in sodium chloride 0.9 % 100 mL IVPB     Discontinue     2 g 200 mL/hr over 30 Minutes Intravenous Every 24 hours 10/10/2019 0535     10/03/2019 0600  metroNIDAZOLE (FLAGYL) IVPB 500 mg     Discontinue     500 mg 100 mL/hr over 60 Minutes Intravenous Every 8 hours 09/28/2019 0516     10/06/2019 0530  ceFEPIme (MAXIPIME) 2 g in sodium chloride 0.9 % 100 mL IVPB        2 g 200 mL/hr over 30 Minutes Intravenous  Once 10/04/2019 0516 10/04/2019 0713   09/29/2019 0530  vancomycin (VANCOREADY) IVPB 1500 mg/300 mL     Discontinue     1,500 mg 150 mL/hr over 120 Minutes Intravenous  Once 10/08/2019 0516     10/07/19 1400  piperacillin-tazobactam (ZOSYN) IVPB 2.25 g  Status:  Discontinued        2.25 g 100 mL/hr over 30 Minutes Intravenous Every 8 hours 10/07/19 1137 10/10/2019 0516   10/12/2019 2230  piperacillin-tazobactam (ZOSYN) IVPB 3.375 g  Status:  Discontinued        3.375 g 100 mL/hr over 30 Minutes Intravenous Every 8 hours 09/17/2019 2212 10/07/19 1137   10/07/2019 1900  piperacillin-tazobactam (ZOSYN) IVPB 3.375 g  Status:  Discontinued        3.375 g 100 mL/hr over 30 Minutes Intravenous  Once 09/16/2019 1846 09/16/2019 2218       Assessment/Plan Atrial fibrillation-on Xarelto-last dose 10/04/2019 Acute on chronic CKD - Cr 2.65  this AM CHF AICD Abdominal aortic aneurysm Peripheral vascular disease COPD on home oxygen Hypertension Hypothyroid Hyperlipidemia GERD   Perforated sigmoid diverticulitis with 2 intra-abdominal abscess - s/p IR drain placement 7/25 - drain with feculent appearing fluid in tubing - WBC up to 17, pt hypotensive, lactic elevated to 5.8>>5.4 this AM - INR 4.7 this AM - repeat INR and may need to give FFP vs vit K - agree with stat non-contrast CT - recommend OR today for exploratory laparotomy with partial colectomy/colostomy - I have notified patient's daughter of change in patient condition and she is coming here  FEN: NPO, IVF VTE: SCDs ID: Zosyn 7/24>7/27; cefepime/flagyl/vanc 7/27>>  LOS: 3 days    Norm Parcel , [facility] 09/23/2019, 8:25 AM Please see Amion for pager number during day  hours 7:00am-4:30pm

## 2019-10-09 NOTE — Consult Note (Signed)
NAME:  Kelsay Haggard, MRN:  [id number], DOB:  [birth year], LOS: 3 ADMISSION DATE:  09/18/2019, CONSULTATION DATE:  7/27 REFERRING MD:  Ninfa Linden, CHIEF COMPLAINT:  Septic shock and coagulopathy    Brief History   79 year old female admitted 7/24 w/ perf sigmoid diverticulitis.Treated conservatively w/ percutaneous drain (placed 7/25) and abx. Failed conservative RX w/ evolving shock and coagulopathy. In OR found ruptured sigmoid colon and stool filled abscess in the pelvis. She had sigmoid colectomy w/ colostomy, washout and pelvic drain placed. Returned to ICU mottled on pressors and mechanical ventilator.   History of present illness   79 year old female admitted 7/24 w/ perforated sigmoid diverticulitis and pneumoperitoneum. She was placed on IV antibiotics and IR was consulted and placed percutaneous drain in LLQ abd abscess on 7/25. On 7/26 was having nausea and drain site as well as low back pain in am. AM/night shift 7/27 BP 70s. Treated w/ conservative fluid resuscitation. WBC up from 8.3 to 17.3. new kidney injury. BP remained low. Later in am more confused, mottled, lactic acid was up and was coagulopathic. She was given IV vitamin K and FFP then went to OR. She went for exploratory lap. Found to have ruptured sigmoid colon and stool filled abscess in the pelvis. She had sigmoid colectomy w/ colostomy, washout and pelvic drain placed. Returned to ICU mottled on pressors and mechanical ventilator. PCCM asked to a/w care   Past Medical History  Atrial fib, CHF, prior AICD in place, AAA, PVD.   Significant Hospital Events   7/24 admitted. ABX started, surg consulted 7/25 perc drain placed 7/27 confused, mottled, hypotensive and coagulopathic ->to OR; found ruptured sigmoid colon and stool filled abscess in the pelvis. She had sigmoid colectomy w/ colostomy, washout and pelvic drain placed. Returned to ICU mottled on pressors and mechanical ventilator.    Consults:  PCCM 7/27 surg 7/24 IR  7/24 Procedures:  Perc drain 7/25 7/27: sigmoid colectomy w/ colostomy, washout and pelvic drain placed  Significant Diagnostic Tests:   7/24 CT renal: Perforated sigmoid diverticulitis with moderate free intraperitoneal gas and development of to pericolonic abscesses within the pelvis. No evidence of obstruction. Progressive dependent body wall edema 7/27: 1. There is interval placement of percutaneous drainage catheter into the large left lower quadrant fluid collection noted on prior exam. This fluid collection appears to be nearly completely decompressed. Smaller fluid collection is seen adjacent to the sigmoid colon. Findings are consistent with sigmoid diverticulitis. 2. Continued presence of a significant amount of pneumoperitoneum seen in the upper portion of the abdomen consistent with bowel perforation.3. Small bilateral pleural effusions are noted with adjacent subsegmental atelectasis.4. Status post stent graft repair of infrarenal abdominal aortic aneurysm.5. Mild anasarca.  Micro Data:  Surgical wound 7/25: rare gpr>> BCX2 7/27>>> Antimicrobials:   Zosyn7/24>>7/26 Flagyl 7/26>>> Cefepime 7/26>>> vanc 7/27>>>  Interim history/subjective:   Returned from OR on pressors   Objective   Blood pressure (Abnormal) 80/50, pulse 70, temperature 98.3 F (36.8 C), resp. rate 16, height 5\' 3"  (1.6 m), weight 84.6 kg, SpO2 93 %.        Intake/Output Summary (Last 24 hours) at 10/01/2019 1329 Last data filed at 09/30/2019 1315 Gross per 24 hour  Intake 4905.94 ml  Output 1975 ml  Net 2930.94 ml   Filed Weights   10/04/2019 1029 10/08/19 0500 09/22/2019 1012  Weight: 80.7 kg 84.6 kg 84.6 kg    Examination: General: critically ill 79 year old white female unresponsive on vent  and on high pressors  HENT: NCAT no JVD  Lungs: equal chest rise. No accessory use  Cardiovascular: Reg irreg  Abdomen: dressing intact. Colostomy is dusky appearing Extremities: mottled and cool Neuro:  unresponsive  GU: no UOP yet   Resolved Hospital Problem list     Assessment & Plan:  Septic shock/MODS w/ hypothermia in setting of perforated sigmoid diverticulitis w/ resultant peritonitis and abd/pelvis abscess. Now s/p washout, resection/ colostomy  and and drain Plan Transduce CVP goal: 8-12 Repeat ABG: Ph goal > 7.2 Titrate pressors for MAP > 65 Ck cortisol Ck cbc Trend WBC ct  Add fungal coverage given profound shock Cont IVFs Day 2 cefepime/flagyl Day 1 vanc Narrow as cultures arise  Warming blanket  Acute respiratory failure (hypoxic and hypercarbic) Plan Full vent support  Adjust Ve as indicated PAD protocol  VAP bundle cxr now and am   AKI w/ worsening anion gap metabolic acidosis/lactic acidosis.  Plan Repeating serial lactic acids Serial chem Renal dose meds May need bicarb gtt to temporize if Ph < 7.2 Strict I&O   Acute metabolic encephalopathy Plan Supportive care PAD protocol  Septic coagulopathy Plan Holding DOAC Repeat INR now  H/o afib and diastolic HF Plan Holding ac given recent OR Holding diuretics     Best practice:  Diet: NPO Pain/Anxiety/Delirium protocol (if indicated): 7/27 VAP protocol (if indicated): ordered DVT prophylaxis: SCD GI prophylaxis: PPI Glucose control: ordered Mobility: BR Code Status: full code  Family Communication: pending  Disposition: ICU care   Labs   CBC: Recent Labs  Lab 10/05/19 1134 09/17/2019 1042 10/07/19 0151 10/08/19 0444 10/05/2019 0402  WBC 11.4* 13.3* 11.3* 8.3 17.3*  NEUTROABS 9,941*  --   --  6.6 14.3*  HGB 9.0* 8.5* 7.9* 8.2* 8.9*  HCT 28.8* 27.7* 24.7* 26.6* 28.1*  MCV 87.3 88.5 86.4 86.9 88.6  PLT 293 328 298 290 517    Basic Metabolic Panel: Recent Labs  Lab 10/05/19 1134 09/21/2019 1042 09/14/2019 2231 10/07/19 0151 10/08/19 0444 09/15/2019 0402  NA 132* 130*  --  132* 136 134*  K 4.6 4.9  --  5.3* 4.0 4.4  CL 96* 96*  --  99 106 102  CO2 20 17*  --  20* 16* 14*   GLUCOSE 76 88  --  72 78 95  BUN 45* 47*  --  49* 45* 45*  CREATININE 2.83* 2.96*  --  2.75* 2.32* 2.65*  CALCIUM 8.8 8.3*  --  7.9* 7.4* 7.2*  MG  --   --  2.4 2.4 2.5* 2.5*  PHOS  --   --  6.2*  --  5.3* 6.5*   GFR: Estimated Creatinine Clearance: 18.3 mL/min (A) (by C-G formula based on SCr of 2.65 mg/dL (H)). Recent Labs  Lab 09/30/2019 1042 10/02/2019 2041 09/26/2019 2231 10/07/19 0151 10/08/19 0444 10/11/2019 0402 09/23/2019 0532 10/12/2019 0719  PROCALCITON  --   --   --   --   --  1.69  --   --   WBC 13.3*  --   --  11.3* 8.3 17.3*  --   --   LATICACIDVEN  --  1.8 1.9  --   --   --  5.8* 5.4*    Liver Function Tests: Recent Labs  Lab 10/05/19 1134 10/10/2019 1042 10/08/19 0444 09/16/2019 0402  AST 14 18 19  92*  ALT 14 18 14  37  ALKPHOS  --  69 56 56  BILITOT 0.7 0.8 0.7 0.9  PROT 5.6*  5.5* 5.0* 4.8*  ALBUMIN  --  2.1* 1.9* 1.8*   No results for input(s): LIPASE, AMYLASE in the last 168 hours. No results for input(s): AMMONIA in the last 168 hours.  ABG    Component Value Date/Time   PHART 7.309 (L) 05/08/2017 0120   PCO2ART 42.9 05/08/2017 0120   PO2ART 108 05/08/2017 0120   HCO3 20.3 05/08/2017 0120   TCO2 26 06/01/2017 1604   ACIDBASEDEF 4.6 (H) 05/08/2017 0120   O2SAT 96.5 05/08/2017 0120     Coagulation Profile: Recent Labs  Lab 10/03/2019 0532 09/30/2019 0810  INR 4.7* 6.1*    Cardiac Enzymes: No results for input(s): CKTOTAL, CKMB, CKMBINDEX, TROPONINI in the last 168 hours.  HbA1C: Hgb A1c MFr Bld  Date/Time Value Ref Range Status  08/27/2019 11:32 AM 5.2 <5.7 % of total Hgb Final    Comment:    For the purpose of screening for the presence of diabetes: . <5.7%       Consistent with the absence of diabetes 5.7-6.4%    Consistent with increased risk for diabetes             (prediabetes) > or =6.5%  Consistent with diabetes . This assay result is consistent with a decreased risk of diabetes. . Currently, no consensus exists regarding use  of hemoglobin A1c for diagnosis of diabetes in children. . According to American Diabetes Association (ADA) guidelines, hemoglobin A1c <7.0% represents optimal control in non-pregnant diabetic patients. Different metrics may apply to specific patient populations.  Standards of Medical Care in Diabetes(ADA). .   02/27/2019 08:41 AM 5.7 (H) <5.7 % of total Hgb Final    Comment:    For someone without known diabetes, a hemoglobin  A1c value between 5.7% and 6.4% is consistent with prediabetes and should be confirmed with a  follow-up test. . For someone with known diabetes, a value <7% indicates that their diabetes is well controlled. A1c targets should be individualized based on duration of diabetes, age, comorbid conditions, and other considerations. . This assay result is consistent with an increased risk of diabetes. . Currently, no consensus exists regarding use of hemoglobin A1c for diagnosis of diabetes for children. .     CBG: No results for input(s): GLUCAP in the last 168 hours.  Review of Systems:   Not able   Past Medical History  She,  has a past medical history of AAA (abdominal aortic aneurysm) ([place]), AICD (automatic cardioverter/defibrillator) present (11/10/2017), Arthritis, Atrial fibrillation ([place]), CHF (congestive heart failure) ([place]), Chronic bronchitis ([place]), COPD (chronic obstructive pulmonary disease) ([place]), Depression, GERD (gastroesophageal reflux disease), Gout, History of hiatal hernia, Hyperlipidemia, Hypertension, Hypothyroid, Migraine headache, Osteopenia, Pneumonia, and PVD (peripheral vascular disease) ([place]).   Surgical History    Past Surgical History:  Procedure Laterality Date  . ABDOMINAL AORTIC ENDOVASCULAR STENT GRAFT N/A 04/11/2013   Procedure: ABDOMINAL AORTIC ENDOVASCULAR STENT GRAFT WITH RIGHT FEMORAL PATCH ANGIOPLASTY;  Surgeon: Mal Misty, MD;  Location: [facility];  Service: Vascular;  Laterality: N/A;  . AV NODE ABLATION N/A  09/13/2018   Procedure: AV NODE ABLATION;  Surgeon: Evans Lance, MD;  Location: [facility];  Service: Cardiovascular;  Laterality: N/A;  . BIV ICD INSERTION CRT-D  11/10/2017  . BIV ICD INSERTION CRT-D N/A 11/10/2017   Procedure: BIV ICD INSERTION CRT-D;  Surgeon: Evans Lance, MD;  Location: [place] [facility];  Service: Cardiovascular;  Laterality: N/A;  . BREAST SURGERY     LEFT BREAST  BIOPSY  . CARDIAC CATHETERIZATION    . CATARACT EXTRACTION W/ INTRAOCULAR LENS  IMPLANT, BILATERAL Bilateral   . CHOLECYSTECTOMY OPEN  1972  . COLONOSCOPY    . ESOPHAGEAL BRUSHING  09/29/2019   Procedure: ESOPHAGEAL BRUSHING;  Surgeon: Rogene Houston, MD;  [facility];  Service: Endoscopy;;  for candida   . ESOPHAGOGASTRODUODENOSCOPY N/A 09/29/2019   Procedure: ESOPHAGOGASTRODUODENOSCOPY (EGD);  Surgeon: Rogene Houston, MD;  [facility];  Service: Endoscopy;  Laterality: N/A;  . EYE SURGERY Bilateral    "bleeding in my eyes"  . FRACTURE SURGERY    . TIBIA FRACTURE SURGERY Left 1970s  . TONSILLECTOMY    . VARICOSE VEIN SURGERY Bilateral    "laser"     Social History   reports that she quit smoking about 17 years ago. Her smoking use included cigarettes. She has a 108.00 pack-year smoking history. She has never used smokeless tobacco. She reports that she does not drink alcohol and does not use drugs.   Family History   Her family history includes Alcohol abuse in her father; Breast cancer in her sister; Cancer (age of onset: 43) in her mother; Cirrhosis in her mother; Depression in her father; Heart defect in her sister; Heart disease in her daughter and sister; Hyperlipidemia in her son; Hypertension in her brother; Stroke in her sister.   Allergies Allergies  Allergen Reactions  . Amiodarone Other (See Comments)    PULMONARY TOXICITY  . Diovan [Valsartan] Other (See Comments)    HYPOTENSION  . Doxycycline Diarrhea and Other (See Comments)    VISUAL  DISTURBANCE  . Flexeril [Cyclobenzaprine] Other (See Comments)    FATIGUE  . Keflex [Cephalexin] Diarrhea  . Verapamil Other (See Comments)    EDEMA  . Ciprofloxacin Diarrhea and Nausea And Vomiting  . Augmentin [Amoxicillin-Pot Clavulanate] Diarrhea and Nausea And Vomiting    Tolerating piperacillin/tazobactam, previously tolerated ampicillin/sulbactam at prior visit  . Codeine Hives     Home Medications  Prior to Admission medications   Medication Sig Start Date End Date Taking? Authorizing Provider  acetaminophen (TYLENOL) 500 MG tablet Take 1,000 mg by mouth every 6 (six) hours as needed for mild pain or moderate pain.   Yes [provider]  albuterol (PROAIR HFA) 108 (90 Base) MCG/ACT inhaler Use 2 Inhalations 15 minutes Apart every 4 hours to Rescue Asthma Attack Patient taking differently: Inhale 2 puffs into the lungs every 4 (four) hours as needed for wheezing or shortness of breath (asthma attack).  07/29/18  Yes [name], MD  allopurinol (ZYLOPRIM) 300 MG tablet Take 0.5 tablets (150 mg total) by mouth daily. 09/20/19  Yes Vicie Mutters, PA-C  aspirin EC 81 MG EC tablet Take 1 tablet (81 mg total) by mouth daily with breakfast. Swallow whole. 10/03/19  Yes Emokpae, Courage, MD  budesonide-formoterol (SYMBICORT) 80-4.5 MCG/ACT inhaler Use 2 inhalations 30 minutes apart 2 x /day (every 12 hours) Patient taking differently: Inhale 2 puffs into the lungs daily as needed (for flares).  08/28/19  Yes [name], MD  cetirizine (ZYRTEC) 10 MG tablet Take 10 mg by mouth daily as needed for allergies.    Yes [provider]  ciprofloxacin (CIPRO) 500 MG tablet Take 500 mg by mouth 2 (two) times daily with a meal.    Yes [provider]  Eyelid Cleansers (AVENOVA) 0.01 % SOLN Apply 1 application topically daily.  11/28/17  Yes [provider]  famotidine (PEPCID) 40 MG tablet Take 1  tablet Daily to Prevent Heartburn & Indigestion Patient taking  differently: Take 40 mg by mouth daily.  10/03/19  Yes [name], MD  ipratropium (ATROVENT) 0.06 % nasal spray Use 1 to 2 sprays each Nostril 2 to 3 x / day as needed Patient taking differently: Place 1-2 sprays into both nostrils 3 (three) times daily as needed for rhinitis (or congestion).  02/27/19 02/28/20 Yes [name], MD  ipratropium-albuterol (DUONEB) 0.5-2.5 (3) MG/3ML SOLN Inhale 3 mLs into the lungs every 6 (six) hours as needed (for shortness of breath or wheezing). 02/27/19  Yes [name], MD  leflunomide (ARAVA) 20 MG tablet Take 20 mg by mouth daily.  06/18/19  Yes [provider]  levothyroxine (SYNTHROID) 125 MCG tablet Take 1 tablet (125 mcg total) by mouth daily before breakfast. 08/18/19  Yes Shelly Coss, MD  metroNIDAZOLE (FLAGYL) 500 MG tablet Take 1 tablet 3 x /day with meals for Infection Patient taking differently: Take 500 mg by mouth with breakfast, with lunch, and with evening meal.  10/04/19  Yes [name], MD  montelukast (SINGULAIR) 10 MG tablet TAKE 1 TABLET DAILY FOR ALLERGIES & ASTHMA Patient taking differently: Take 10 mg by mouth daily. for Allergies & Asthma 02/27/19  Yes [name], MD  OXYGEN Inhale 1 L/min into the lungs at bedtime.    Yes [provider]  Polyethyl Glycol-Propyl Glycol (SYSTANE) 0.4-0.3 % SOLN Place 1 drop into both eyes 5 (five) times daily.    Yes [provider]  pravastatin (PRAVACHOL) 20 MG tablet Take 1 tablet at Bedtime for Cholesterol Patient taking differently: Take 10 mg by mouth every other day.  09/21/19  Yes [name], MD  pregabalin (LYRICA) 25 MG capsule Take 1 capsule 2 to 3 x /day as needed for Neuropathy pain Patient taking differently: Take 25 mg by mouth 3 (three) times daily. for neuropathic pain 08/30/19  Yes [name], MD  Probiotic Product ([employer] PROBIOTIC) CAPS Take 1 capsule by mouth daily.   Yes [provider]  Rivaroxaban (XARELTO) 15 MG  TABS tablet Take 15 mg by mouth every evening.   Yes [provider]  simethicone (MYLICON) 784 MG chewable tablet Chew 125 mg by mouth every 6 (six) hours as needed for flatulence.   Yes [provider]  torsemide (DEMADEX) 20 MG tablet Take 2 tablets (40 mg total) by mouth daily. Starting on Thursday 10/04/19 --- after repeat blood work is available Patient taking differently: Take 40 mg by mouth See admin instructions. Take 2 tablets (40 mg) by mouth twice daily - morning and afternoon (2pm) 10/04/19  Yes Emokpae, Courage, MD  traMADol (ULTRAM) 50 MG tablet Take 50 mg by mouth 2 (two) times daily as needed (for pain).   Yes [provider]  potassium chloride SA (KLOR-CON) 20 MEQ tablet Take 20 mEq by mouth daily.     [provider]     Critical care time: 45 min     Erick Colace ACNP-BC [place] Pager # [phone] OR # [phone] if no answer

## 2019-10-09 NOTE — Transfer of Care (Signed)
Immediate Anesthesia Transfer of Care Note  Patient: Donell Sliwinski  Procedure(s) Performed: EXPLORATORY LAPAROTOMY; SIGMOID COLECTOMY WITH COLOSTOMY (N/A Abdomen) DRAIN REMOVAL (Left Abdomen) DRAINAGE PELVIC ABSCESS (N/A Abdomen)  Patient Location: ICU  Anesthesia Type:General  Level of Consciousness: sedated, unresponsive and Patient remains intubated per anesthesia plan  Airway & Oxygen Therapy: Patient remains intubated per anesthesia plan and Patient placed on Ventilator (see vital sign flow sheet for setting)  Post-op Assessment: Report given to RN and Post -op Vital signs reviewed and stable  Post vital signs: Reviewed and stable  Last Vitals:  Vitals Value Taken Time  BP    Temp    Pulse    Resp 13 10/10/2019 1353  SpO2    Vitals shown include unvalidated device data.  Last Pain:  Vitals:   09/28/2019 0156  TempSrc:   PainSc: 4       Patients Stated Pain Goal: 3 (56/25/63 8937)  Complications: No complications documented.

## 2019-10-09 NOTE — Progress Notes (Signed)
Pharmacy Antibiotic Note  Katie [name] is a 79 y.o. female admitted on 09/24/2019 with perforated diverticulum; pt became hypotensive overnight and WBC increased from 8.3 to 17.3 over one day.  Pharmacy has been consulted to broaden ABX to vancomycin and cefepime; MD also added Flagyl appropriately.  Plan: Vancomycin 1500mg  x1 then 1000mg  IV every 24 hours.  Goal trough 15-20 mcg/mL. Cefepime 2g IV Q24H.  Height: 5\' 3"  (160 cm) Weight: 84.6 kg (186 lb 8.2 oz) IBW/kg (Calculated) : 52.4  Temp (24hrs), Avg:98.3 F (36.8 C), Min:98.3 F (36.8 C), Max:98.3 F (36.8 C)  Recent Labs  Lab 10/05/19 1134 10/02/2019 1042 09/30/2019 2041 09/15/2019 2231 10/07/19 0151 10/08/19 0444 09/17/2019 0402  WBC 11.4* 13.3*  --   --  11.3* 8.3 17.3*  CREATININE 2.83* 2.96*  --   --  2.75* 2.32* 2.65*  LATICACIDVEN  --   --  1.8 1.9  --   --   --     Estimated Creatinine Clearance: 18.3 mL/min (A) (by C-G formula based on SCr of 2.65 mg/dL (H)).    Allergies  Allergen Reactions  . Amiodarone Other (See Comments)    PULMONARY TOXICITY  . Diovan [Valsartan] Other (See Comments)    HYPOTENSION  . Doxycycline Diarrhea and Other (See Comments)    VISUAL DISTURBANCE  . Flexeril [Cyclobenzaprine] Other (See Comments)    FATIGUE  . Keflex [Cephalexin] Diarrhea  . Verapamil Other (See Comments)    EDEMA  . Ciprofloxacin Diarrhea and Nausea And Vomiting  . Augmentin [Amoxicillin-Pot Clavulanate] Diarrhea and Nausea And Vomiting    Tolerating piperacillin/tazobactam, previously tolerated ampicillin/sulbactam at prior visit  . Codeine Hives    Thank you for allowing pharmacy to be a part of this patient's care.  Wynona Neat, PharmD, BCPS  09/16/2019 5:30 AM

## 2019-10-09 NOTE — Op Note (Signed)
EXPLORATORY LAPAROTOMY; SIGMOID COLECTOMY WITH COLOSTOMY, DRAIN REMOVAL, DRAINAGE PELVIC ABSCESS  Procedure Note  Katie [name] 10/08/2019 - 10/05/2019   Pre-op Diagnosis: perforated sigmoid diverticulitis     Post-op Diagnosis: same  Procedure(s): EXPLORATORY LAPAROTOMY SIGMOID COLECTOMY WITH COLOSTOMY DRAINAGE PELVIC ABSCESS  Surgeon(s): Coralie Keens, MD  Anesthesia: General  Staff:  Circulator: Rometta Emery, RN Physician Assistant: Norm Parcel, PA-C Scrub Person: [place], Karrie S, RN; [place], Marita [place], RN Circulator Assistant: Cecilio Asper, RN  Estimated Blood Loss: Minimal               Specimens: sent to path  Indications: This is a 79 year old female who was admitted with perforated diverticulitis.  Conservative management was attempted with an IR placed drain in the pelvic abscess.  Over the last several hours, however, she became hypotensive and coagulopathic and the drain started showing feculent drainage.  The decision was made to proceed emergently to the operating room for exploratory laparotomy  Findings: The patient was found to have rupture of the sigmoid colon with a stool filled abscess in the pelvis.  Procedure: The patient was brought to operating identifies correct patient.  She is placed upon the operating table general anesthesia was induced.  Her abdomen was prepped and draped in usual sterile fashion.  I created a lower midline incision with a scalpel.  I then took this down through the subcutaneous tissue to the fascia which was then opened the entire length of the incision include the peritoneum.  Upon entering the abdomen identified an abscess full of stool and feculent fluid in the left lower quadrant coming from the sigmoid colon.  I had removed the drain going in this area that is been placed by interventional radiology.  There were large inflammatory process in the retroperitoneum along the uterus from this.  I was able to identify proximal  rectum that was normal there was distal to the area of perforation in transit this with the contour stapler.  I then mobilized the colon along the white line of Toldt take down the mesentery with the LigaSure and several silk sutures.  Once I got the colon mobilized enough decision made to go ahead and proceed with an ostomy.  I made a circular incision the patient's left lower quadrant with a cautery.  I then took this down to the fascia which opened a cruciate fashion.  The underlying muscle fibers then separated.  I then opened the peritoneum.  I pulled out the sigmoid colon through this opening as the ostomy.  This included the section of perforated colon.  We then irrigated the abdomen with saline.  Most of the raw surface of the peritoneum and uterus had a slow ooze.  There were no major bleeding vessels.  I controlled this with Arista and Surgicel.  I then placed a 77 [place] Blake drain under direct vision in the pelvis and sutured this in place with a nylon suture.  Again hemostasis appeared to be achieved.  I then closed the patient midline fascia with running #1 looped PDS suture.  I then resected the colon at the colostomy site proximal to the area of perforation with the cautery.  This section of sigmoid colon was then sent to pathology for evaluation.  I then matured the ostomy circumferentially with interrupted 3-0 Vicryl sutures.  The ostomy appeared viable.  At this point the midline wound was irrigated with saline.  Hemostasis appeared to be achieved.  It was then packed open with wet-to-dry saline soaked  Kerlix.  Dry gauze and ABDs were placed over this.  An ostomy appliance was placed on the colostomy.  At this point all sponge and instrument counts were correct at the end of the procedure.  The plan of anesthesiology is to attempt extubation and then transferred to the intensive care unit.          Coralie Keens   Date: 09/16/2019  Time: 12:09 PM

## 2019-10-09 NOTE — Anesthesia Postprocedure Evaluation (Signed)
Anesthesia Post Note  Patient: Katie [name]  Procedure(s) Performed: EXPLORATORY LAPAROTOMY; SIGMOID COLECTOMY WITH COLOSTOMY (N/A Abdomen) DRAIN REMOVAL (Left Abdomen) DRAINAGE PELVIC ABSCESS (N/A Abdomen)     Patient location during evaluation: SICU Anesthesia Type: General Level of consciousness: sedated Pain management: pain level controlled Vital Signs Assessment: post-procedure vital signs reviewed and stable Respiratory status: patient remains intubated per anesthesia plan Cardiovascular status: stable Postop Assessment: no apparent nausea or vomiting Anesthetic complications: no Comments: Patient remained acidotic throughout case despite bicarb pushes, boluses of IVF, and vasopressors. Patient was mottled in appearance prior to transport. ARF setting in with increase in both creatinine and potassium    No complications documented.  Last Vitals:  Vitals:   09/18/2019 1630 10/08/2019 1700  BP:    Pulse:    Resp: 22 23  Temp:    SpO2:      Last Pain:  Vitals:   10/12/2019 1544  TempSrc: Oral  PainSc:                  Garrie Woodin S

## 2019-10-09 NOTE — Progress Notes (Signed)
PROGRESS NOTE    Katie [name]  OVF:[id number] DOB: 03-13-1941 DOA: 09/29/2019 PCP: [name], MD     Brief Narrative:  79 y.o. WF PMHx HTN, HLD, chronic A-fib on Xarelto, CHF with a BiVent AICD, severe pulmonary HTN, AAA CKD stage III , Hypothyroidism, Gout and Rheumatoid Arthritis  Presented to ED after AKI found by PCP and found to have perforated sigmoid diverticulitis with air into the mesentery, some moderate free air and 2 intra-abdominal abscesses.  Patient presents after being sent by PCP for AKI found on labs. She was recently admitted earlier this month for lower GI bleed and found to have diverticulitis and started on antibiotics. Reports diarrhea for several weeks with about 2-3 loose stools daily. Denies water stools. She stopped taking antibiotics about 4-5 days ago due to side effects. For the past month, reports right flank pain, suprapubic abdominal pain and generalized abdominal pain. Reports pain has not necessarily worsened but has persisted. She has not noticed a pattern of when pain is worse but pain is intermittent. She has continued to have some blood in her stools. Reports minimal appetite for several weeks and last ate 2 days ago. Last took Xarelto two days ago. She did vomit once  yesterday and today. Reports some "tingling" with urination. Denies headache, dizziness, fever, chills, cough, SOB, chest pain, constipation, dysuria, hematuria, melena, difficulty moving arms/legs, speech difficulty, confusion or any other complaints.  In the ED: Vitals stable on room air. Labs remarkable for Na 130, Cr 2.96, CO2 17, AG 17, WBC 13.3, Hgb 8.5, UA with moderate leuks.  CXR: non-acute CT Renal Study with perforated sigmoid diverticulitis with air into the mesentery, some moderate free air and 2 intra-abdominal abscesses.  Patient was started on Zosyn and given 1L IVF. General Surgery consulted by EDP and saw patient. Admission by hospitalist  requested.    Subjective: 7/27 afebrile overnight patient developed significant leukocytosis, lactic acidosis.  Spoke with RN Patty RLQ drain still draining.  Flush drain and 20 cc returned.  Patient sent for stat abdominal pelvic CT.   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 -Continue current antibiotics -S/p CT abscess drain placement RLQ 24f -7/27 while patient was downstairs obtaining stat abdominal and pelvic CT paged by PA Kelly [facility], patient taken to emergent surgery.  Post surgery PA Claiborne Billings stated they wanted patient to be transferred to PCCM secondary to her remaining on pressors and being intubated.  Acute on CKDIIIb (baseline  Cr 1.4) -Hold all nephrotoxic medication -Hold furosemide, Lyrica, allopurinol -Monitor closely Recent Labs  Lab  10/05/19 1134 09/14/2019 1042 10/07/19 0151 10/08/19 0444 09/13/2019 0402  CREATININE 2.83* 2.96*  2.75* 2.32* 2.65*    Severe pulmonary HTN -Previous echocardiogram May 2021 PASP 76.5 mmHg -Strict ins and outs +1.9 L -Daily weight Filed Weights   10/05/2019 1029 10/08/19 0500  Weight: 80.7 kg 84.6 kg    Severe sepsis -Continue current antibiotics -See hypotension -CT abdomen pelvis pending -Transfer to 4 NP  Hypotension -Severe hypoalbuminemia -7/27 Albumin 50 g + sodium bicarb 125 ml/hr -Trying to obtain another large-bore IV, may have to start pressors.  Would start patient on Levophed   History of atherosclerosis of native coronary artery of native heart/Atrial fibrillation - CHA2Ds2-VASc score= 5 - Xarelto will be held at this time due to GI bleed and pre-operative state -Patient not on rate control agent -Currently NSR -We will restart Xarelto on 7/26  Hyponatremia -Most likely secondary to CHF, and renal failure - Na 130 on admission; previously 136 on 7/20 - Urine studies ordered - s/p 1 L NS in ED; hold Torsemide - Trend  -7/26 DC normal saline -7/27 increase sodium bicarb 125 ml/hr, secondary to hyponatremia and increasing renal failure, and increasing metabolic alkalosis.  Hyperlipidemia -Pravastatin 10 mg daily -Lipid panel pending   Hypothyroidism -Synthroid 125 mcg daily   Gout -- holding home Allopurinol and Lyrica due to AK  Neuropathic pain -See gout  Bilateral lower extremity wounds -Normally obtains outpatient wound care -7/25 request wound care consult;Patient with bilateral lower extremity wounds evaluate and dress  Hyperkalemia -Lokelma 10 g x 1  Muscle spasms  -Muscle spasms supraspinatus, scalene deltoid muscle, trapezius. -Robaxin IV 500 mg TID  -ADDENDUM DC Robaxin patient states confusion with Robaxin; DC, hot packs/cold packs and Tylenol    Goals of care -7/25 palliative care consult patient with multiple medical problems discussed change of code to DNR, advanced directives   DVT prophylaxis: SCD Code Status:  Full Family Communication: 7/25 spoke with daughter discussed plan of care answered all questions Status is: Inpatient    Dispo: The patient is from: Home              Anticipated d/c is to: Home              Anticipated d/c date is: 8/2              Patient currently unstable      Consultants:  [facility] IR    Procedures/Significant Events:  5/31 Echocardiogram:Left Ventricle: LVEF=60 to 65%. left ventricle has no regional wall motion abnormalities.-moderate LVH   Right Ventricle:-Severely elevated PASP 76.5 mmHg.  - A pacer wire is visualized.  ------------------------------------------------------------------------------------------------------------------------------------- 7/25 s/p CT abscess drain placement RLQ 80f   I have personally reviewed and interpreted all radiology studies and my findings are as above.  VENTILATOR SETTINGS:    Cultures   Antimicrobials: Anti-infectives (From admission, onward)   Start     Ordered Stop   10/07/19 1400  piperacillin-tazobactam (ZOSYN) IVPB 2.25 g     Discontinue     10/07/19 1137     10/07/2019 2230  piperacillin-tazobactam (ZOSYN) IVPB 3.375 g  Status:  Discontinued        09/17/2019 2212 10/07/19 1137   09/26/2019 1900  piperacillin-tazobactam (ZOSYN) IVPB 3.375 g  Status:  Discontinued        10/05/2019 1846 10/08/2019 2218       Devices    LINES / TUBES:      Continuous Infusions: . [START ON 10/10/2019] ceFEPime (MAXIPIME)  IV    . lactated ringers 1,000 mL (10/07/2019 0524)  . metronidazole 500 mg (10/01/2019 0528)  .  sodium bicarbonate  infusion 1000 mL    . [START ON 10/11/2019] vancomycin    . vancomycin       Objective: Vitals:   09/13/2019 0156 09/18/2019 0410 09/27/2019 0411 09/15/2019 0610  BP: (!) 73/49 (!) 87/43  (!) 82/49  Pulse: 70 69 70 70  Resp: 16 18  16   Temp:      TempSrc:      SpO2: 95% (!) 76% 94% 90%  Weight:      Height:        Intake/Output Summary (Last 24 hours) at 09/28/2019 0713 Last data  filed at 10/10/2019 2297 Gross per 24 hour  Intake 2041.94 ml  Output 2130 ml  Net -88.06 ml   Filed Weights   10/08/2019 1029 10/08/19 0500  Weight: 80.7 kg 84.6 kg   Physical Exam:  Did not physically see patient as, as soon as I received emergent call from RN patient sent for stat abdominal and pelvic CT to determine cause of significant leukocytosis and hypotension..  IV team emergently paged to place large-bore IVs in order to start pressors.  At that point CCS decided to take patient emergently to surgery.  No charge  .     Data Reviewed: Care during the described time interval was provided by me .  I have reviewed this patient's available data, including medical history, events of note, physical examination, and all test results as part of my evaluation.  CBC: Recent Labs  Lab 10/05/19 1134 09/22/2019 1042 10/07/19 0151 10/08/19 0444 10/06/2019 0402  WBC 11.4* 13.3* 11.3* 8.3 17.3*  NEUTROABS 9,941*  --   --  6.6 14.3*  HGB 9.0* 8.5* 7.9* 8.2* 8.9*  HCT 28.8* 27.7* 24.7* 26.6* 28.1*  MCV 87.3 88.5 86.4 86.9 88.6  PLT 293 328 298 290 989   Basic Metabolic Panel: Recent Labs  Lab 10/05/19 1134 09/16/2019 1042 09/15/2019 2231 10/07/19 0151 10/08/19 0444 09/16/2019 0402  NA 132* 130*  --  132* 136 134*  K 4.6 4.9  --  5.3* 4.0 4.4  CL 96* 96*  --  99 106 102  CO2 20 17*  --  20* 16* 14*  GLUCOSE 76 88  --  72 78 95  BUN 45* 47*  --  49* 45* 45*  CREATININE 2.83* 2.96*  --  2.75* 2.32* 2.65*  CALCIUM 8.8 8.3*  --  7.9* 7.4* 7.2*  MG  --   --  2.4 2.4 2.5* 2.5*  PHOS  --   --  6.2*  --  5.3* 6.5*   GFR: Estimated Creatinine Clearance: 18.3 mL/min (A) (by C-G formula based on SCr of 2.65 mg/dL (H)). Liver Function Tests: Recent Labs  Lab 10/05/19 1134 10/05/2019 1042 10/08/19 0444 10/05/2019 0402  AST 14 18 19  92*  ALT 14 18 14  37  ALKPHOS  --  69 56 56  BILITOT 0.7 0.8 0.7 0.9  PROT 5.6* 5.5* 5.0* 4.8*  ALBUMIN  --  2.1* 1.9* 1.8*   No results for input(s):  LIPASE, AMYLASE in the last 168 hours. No results for input(s): AMMONIA in the last 168 hours. Coagulation Profile: Recent Labs  Lab 09/21/2019 0532  INR 4.7*   Cardiac Enzymes: No results for input(s): CKTOTAL, CKMB, CKMBINDEX, TROPONINI in the last 168 hours. BNP (last 3 results) No results for input(s): PROBNP in the last 8760 hours. HbA1C: No  results for input(s): HGBA1C in the last 72 hours. CBG: No results for input(s): GLUCAP in the last 168 hours. Lipid Profile: Recent Labs    10/08/19 0444  CHOL 65  HDL 13*  LDLCALC 30  TRIG 111  CHOLHDL 5.0   Thyroid Function Tests: No results for input(s): TSH, T4TOTAL, FREET4, T3FREE, THYROIDAB in the last 72 hours. Anemia Panel: No results for input(s): VITAMINB12, FOLATE, FERRITIN, TIBC, IRON, RETICCTPCT in the last 72 hours. Sepsis Labs: Recent Labs  Lab 10/12/2019 2041 10/07/2019 2231 10/08/2019 0532  LATICACIDVEN 1.8 1.9 5.8*    Recent Results (from the past 240 hour(s))  KOH prep     Status: None   Collection Time: 09/29/19 12:33 PM   Specimen: Bronchial Brush  Result Value Ref Range Status   Specimen Description BRONCHIAL BRUSHING  Final   Special Requests NONE  Final   KOH Prep   Final    FEW YEAST Performed at [facility], [address] [place], [place] [address]    Report Status 09/29/2019 FINAL  Final  SARS Coronavirus 2 by RT PCR (hospital order, performed in [facility] hospital lab) Nasopharyngeal Nasopharyngeal Swab     Status: None   Collection Time: 09/19/2019  8:41 PM   Specimen: Nasopharyngeal Swab  Result Value Ref Range Status   SARS Coronavirus 2 NEGATIVE NEGATIVE Final    Comment: (NOTE) SARS-CoV-2 target nucleic acids are NOT DETECTED.  The SARS-CoV-2 RNA is generally detectable in upper and lower respiratory specimens during the acute phase of infection. The lowest concentration of SARS-CoV-2 viral copies this assay can detect is 250 copies / mL. A negative result does not preclude SARS-CoV-2  infection and should not be used as the sole basis for treatment or other patient management decisions.  A negative result may occur with improper specimen collection / handling, submission of specimen other than nasopharyngeal swab, presence of viral mutation(s) within the areas targeted by this assay, and inadequate number of viral copies (<250 copies / mL). A negative result must be combined with clinical observations, patient history, and epidemiological information.  Fact Sheet for Patients:   StrictlyIdeas.no  Fact Sheet for Healthcare Providers: BankingDealers.co.za  This test is not yet approved or  cleared by the [place] FDA and has been authorized for detection and/or diagnosis of SARS-CoV-2 by FDA under an Emergency Use Authorization (EUA).  This EUA will remain in effect (meaning this test can be used) for the duration of the COVID-19 declaration under Section 564(b)(1) of the Act, 21 U.S.C. section 360bbb-3(b)(1), unless the authorization is terminated or revoked sooner.  Performed at [facility], [place] [address] [place], [place] [address]   Aerobic/Anaerobic Culture (surgical/deep wound)     Status: None (Preliminary result)   Collection Time: 10/07/19  3:05 PM   Specimen: Abscess  Result Value Ref Range Status   Specimen Description ABSCESS  Final   Special Requests DRAIN  Final   Gram Stain   Final    RARE WBC PRESENT, PREDOMINANTLY PMN RARE GRAM POSITIVE RODS    Culture   Final    CULTURE REINCUBATED FOR BETTER GROWTH Performed at [facility], [place] [address], [place], [place] [address]    Report Status PENDING  Incomplete         Radiology Studies: CT IMAGE GUIDED DRAINAGE BY PERCUTANEOUS CATHETER  Result Date: 10/07/2019 CLINICAL DATA:  Pelvic abscess, presumed diverticular origin EXAM: CT GUIDED DRAINAGE OF PELVIC ABSCESS ANESTHESIA/SEDATION: Intravenous Fentanyl 42mcg and  Versed 2mg   were administered as conscious sedation during continuous monitoring of the patient's level of consciousness and physiological / cardiorespiratory status by the radiology RN, with a total moderate sedation time of 21 minutes. PROCEDURE: The procedure, risks, benefits, and alternatives were explained to the patient. Questions regarding the procedure were encouraged and answered. The patient understands and consents to the procedure. Patient placed right lateral decubitus. Select axial scans through the pelvis were obtained. The collection was localized and an appropriate skin entry site was determined and marked. The operative field was prepped with chlorhexidinein a sterile fashion, and a sterile drape was applied covering the operative field. A sterile gown and sterile gloves were used for the procedure. Local anesthesia was provided with 1% Lidocaine. Under CT fluoroscopic guidance, 18 gauge trocar needle advanced into the collection. An Amplatz guidewire advanced easily within the collection. Tract dilated to allow placement of a 12 French pigtail drain catheter, formed centrally within the collection. Approximately 5 mL of bloody slightly purulent material were aspirated, sent for Gram stain and culture. The catheter was secured externally with 0 Prolene suture and StatLock and placed to gravity drain bag. The patient tolerated the procedure well. COMPLICATIONS: None immediate FINDINGS: Left pelvic gas and fluid collection was localized. 12 French pigtail drain catheter placed as above. 5 mL bloody aspirate was sent for Gram stain and culture. IMPRESSION: Technically successful CT-guided pelvic abscess drain catheter placement. Electronically Signed   By: Lucrezia Europe M.D.   On: 10/07/2019 15:13        Scheduled Meds: . famotidine  20 mg Oral Daily  . fluticasone furoate-vilanterol  1 puff Inhalation Daily  . levothyroxine  125 mcg Oral Q0600  . lidocaine  1 patch Transdermal Q24H  . montelukast  10 mg  Oral Daily  . pravastatin  10 mg Oral QODAY  . saccharomyces boulardii  250 mg Oral BID  . sodium chloride flush  5 mL Intracatheter Q8H   Continuous Infusions: . [START ON 10/10/2019] ceFEPime (MAXIPIME) IV    . lactated ringers 1,000 mL (09/30/2019 0524)  . metronidazole 500 mg (10/04/2019 0528)  .  sodium bicarbonate  infusion 1000 mL    . [START ON 10/11/2019] vancomycin    . vancomycin       LOS: 3 days    Time spent:40 min    Noe Pittsley, Geraldo Docker, MD [facility] Pager [phone]  If 7PM-7AM, please contact night-coverage [email or web address] Password [facility] 10/12/2019, 7:13 AM

## 2019-10-10 ENCOUNTER — Inpatient Hospital Stay (HOSPITAL_COMMUNITY): Payer: Medicare Other

## 2019-10-10 ENCOUNTER — Encounter (HOSPITAL_COMMUNITY): Payer: Self-pay | Admitting: Surgery

## 2019-10-10 DIAGNOSIS — A419 Sepsis, unspecified organism: Secondary | ICD-10-CM

## 2019-10-10 DIAGNOSIS — I5042 Chronic combined systolic (congestive) and diastolic (congestive) heart failure: Secondary | ICD-10-CM

## 2019-10-10 DIAGNOSIS — J449 Chronic obstructive pulmonary disease, unspecified: Secondary | ICD-10-CM

## 2019-10-10 DIAGNOSIS — Z515 Encounter for palliative care: Secondary | ICD-10-CM

## 2019-10-10 DIAGNOSIS — Z7189 Other specified counseling: Secondary | ICD-10-CM

## 2019-10-10 DIAGNOSIS — R6521 Severe sepsis with septic shock: Secondary | ICD-10-CM

## 2019-10-10 DIAGNOSIS — J9620 Acute and chronic respiratory failure, unspecified whether with hypoxia or hypercapnia: Secondary | ICD-10-CM

## 2019-10-10 LAB — POCT I-STAT 7, (LYTES, BLD GAS, ICA,H+H)
Acid-base deficit: 2 mmol/L (ref 0.0–2.0)
Bicarbonate: 22.3 mmol/L (ref 20.0–28.0)
Calcium, Ion: 0.61 mmol/L — CL (ref 1.15–1.40)
HCT: 29 % — ABNORMAL LOW (ref 36.0–46.0)
Hemoglobin: 9.9 g/dL — ABNORMAL LOW (ref 12.0–15.0)
O2 Saturation: 97 %
Patient temperature: 100.2
Potassium: 4.3 mmol/L (ref 3.5–5.1)
Sodium: 133 mmol/L — ABNORMAL LOW (ref 135–145)
TCO2: 23 mmol/L (ref 22–32)
pCO2 arterial: 36.9 mmHg (ref 32.0–48.0)
pH, Arterial: 7.392 (ref 7.350–7.450)
pO2, Arterial: 91 mmHg (ref 83.0–108.0)

## 2019-10-10 LAB — LACTIC ACID, PLASMA
Lactic Acid, Venous: 5.3 mmol/L (ref 0.5–1.9)
Lactic Acid, Venous: 2.6 mmol/L (ref 0.5–1.9)

## 2019-10-10 LAB — COMPREHENSIVE METABOLIC PANEL
ALT: 91 U/L — ABNORMAL HIGH (ref 0–44)
AST: 266 U/L — ABNORMAL HIGH (ref 15–41)
Albumin: 2 g/dL — ABNORMAL LOW (ref 3.5–5.0)
Alkaline Phosphatase: 71 U/L (ref 38–126)
Anion gap: 17 — ABNORMAL HIGH (ref 5–15)
BUN: 45 mg/dL — ABNORMAL HIGH (ref 8–23)
CO2: 20 mmol/L — ABNORMAL LOW (ref 22–32)
Calcium: 7.6 mg/dL — ABNORMAL LOW (ref 8.9–10.3)
Chloride: 99 mmol/L (ref 98–111)
Creatinine, Ser: 2.98 mg/dL — ABNORMAL HIGH (ref 0.44–1.00)
GFR calc Af Amer: 17 mL/min — ABNORMAL LOW (ref >60)
GFR calc non Af Amer: 15 mL/min — ABNORMAL LOW (ref >60)
Glucose, Bld: 137 mg/dL — ABNORMAL HIGH (ref 70–99)
Potassium: 4.3 mmol/L (ref 3.5–5.1)
Sodium: 136 mmol/L (ref 135–145)
Total Bilirubin: 1.7 mg/dL — ABNORMAL HIGH (ref 0.3–1.2)
Total Protein: 4.5 g/dL — ABNORMAL LOW (ref 6.5–8.1)

## 2019-10-10 LAB — GLUCOSE, CAPILLARY
Glucose-Capillary: 125 mg/dL — ABNORMAL HIGH (ref 70–99)
Glucose-Capillary: 134 mg/dL — ABNORMAL HIGH (ref 70–99)
Glucose-Capillary: 135 mg/dL — ABNORMAL HIGH (ref 70–99)
Glucose-Capillary: 139 mg/dL — ABNORMAL HIGH (ref 70–99)
Glucose-Capillary: 141 mg/dL — ABNORMAL HIGH (ref 70–99)
Glucose-Capillary: 143 mg/dL — ABNORMAL HIGH (ref 70–99)
Glucose-Capillary: 157 mg/dL — ABNORMAL HIGH (ref 70–99)

## 2019-10-10 LAB — PREPARE FRESH FROZEN PLASMA
Unit division: 0
Unit division: 0

## 2019-10-10 LAB — CBC WITH DIFFERENTIAL/PLATELET
Abs Immature Granulocytes: 0.46 10*3/uL — ABNORMAL HIGH (ref 0.00–0.07)
Basophils Absolute: 0.1 10*3/uL (ref 0.0–0.1)
Basophils Relative: 0 %
Eosinophils Absolute: 0.1 10*3/uL (ref 0.0–0.5)
Eosinophils Relative: 0 %
HCT: 30.5 % — ABNORMAL LOW (ref 36.0–46.0)
Hemoglobin: 9.7 g/dL — ABNORMAL LOW (ref 12.0–15.0)
Immature Granulocytes: 2 %
Lymphocytes Relative: 4 %
Lymphs Abs: 0.9 10*3/uL (ref 0.7–4.0)
MCH: 27.6 pg (ref 26.0–34.0)
MCHC: 31.8 g/dL (ref 30.0–36.0)
MCV: 86.6 fL (ref 80.0–100.0)
Monocytes Absolute: 0.7 10*3/uL (ref 0.1–1.0)
Monocytes Relative: 4 %
Neutro Abs: 17.3 10*3/uL — ABNORMAL HIGH (ref 1.7–7.7)
Neutrophils Relative %: 90 %
Platelets: 219 10*3/uL (ref 150–400)
RBC: 3.52 MIL/uL — ABNORMAL LOW (ref 3.87–5.11)
RDW: 18 % — ABNORMAL HIGH (ref 11.5–15.5)
WBC: 19.4 10*3/uL — ABNORMAL HIGH (ref 4.0–10.5)
nRBC: 7.2 % — ABNORMAL HIGH (ref 0.0–0.2)

## 2019-10-10 LAB — PROTIME-INR
INR: 2.1 — ABNORMAL HIGH (ref 0.8–1.2)
Prothrombin Time: 22.5 s — ABNORMAL HIGH (ref 11.4–15.2)

## 2019-10-10 LAB — PHOSPHORUS: Phosphorus: 7.1 mg/dL — ABNORMAL HIGH (ref 2.5–4.6)

## 2019-10-10 LAB — BPAM RBC
Blood Product Expiration Date: 202108222359
Blood Product Expiration Date: 202108222359
ISSUE DATE / TIME: 202107271122
ISSUE DATE / TIME: 202107271122
Unit Type and Rh: 6200
Unit Type and Rh: 6200

## 2019-10-10 LAB — TRIGLYCERIDES: Triglycerides: 87 mg/dL (ref <150)

## 2019-10-10 LAB — TYPE AND SCREEN
ABO/RH(D): A POS
Antibody Screen: NEGATIVE
Unit division: 0
Unit division: 0

## 2019-10-10 LAB — BPAM FFP
Blood Product Expiration Date: 202107292359
Blood Product Expiration Date: 202107292359
ISSUE DATE / TIME: 202107271127
ISSUE DATE / TIME: 202107271127
Unit Type and Rh: 6200
Unit Type and Rh: 6200

## 2019-10-10 LAB — MAGNESIUM: Magnesium: 2.4 mg/dL (ref 1.7–2.4)

## 2019-10-10 LAB — PATHOLOGIST SMEAR REVIEW

## 2019-10-10 LAB — CORTISOL: Cortisol, Plasma: 18 ug/dL

## 2019-10-10 LAB — SURGICAL PATHOLOGY

## 2019-10-10 MED ORDER — LACTATED RINGERS IV SOLN: INTRAVENOUS | Status: DC

## 2019-10-10 MED ORDER — SODIUM CHLORIDE 0.9% FLUSH: 10.0000 mL | INTRAVENOUS | Status: DC | PRN

## 2019-10-10 MED ORDER — SODIUM CHLORIDE 0.9% FLUSH
10.0000 mL | Freq: Two times a day (BID) | INTRAVENOUS | Status: DC
  Administered 2019-10-10 – 2019-10-12 (×5): 10 mL

## 2019-10-10 MED FILL — Sodium Chloride IV Soln 0.9%: INTRAVENOUS | Qty: 100 | Status: AC

## 2019-10-10 MED FILL — Vasopressin IV Soln 20 Unit/ML (For IV Infusion): INTRAVENOUS | Qty: 1 | Status: AC

## 2019-10-10 NOTE — Consult Note (Addendum)
[facility] Nurse ostomy consult note Stoma type/location: Pt had colostomy surgery performed yeaterday. Current pouch is leaking. Stomal assessment/size: Stoma is 2 inches, dusky red and flush with skin level Peristomal assessment: intact skin surrounding Output: mod amt liquid brown stool  Ostomy pouching: 1pc. flexible convex  Education provided:  Pt is critically ill and on the vent.  No family members present during pouch change. Applied barrier ring and one piece flexible convex pouch to attempt to maintain a seal.  Supplies ordered to the room for staff nurse use and educational materials left at the bedside.  [place] team will assess the stoma weekly and will begin teaching sessions when stable and out of ICU. Enrolled patient in [place] program: Not yet. Julien Girt MSN, RN, [place], [place], [place]

## 2019-10-10 NOTE — Progress Notes (Signed)
NAME:  Katie [name], MRN:  [id number], DOB:  [birth year], LOS: 4 ADMISSION DATE:  09/15/2019, CONSULTATION DATE:  7/27 REFERRING MD:  Ninfa Linden, CHIEF COMPLAINT:  Septic shock and coagulopathy    Brief History   79 year old female admitted 7/24 w/ perf sigmoid diverticulitis.Treated conservatively w/ percutaneous drain (placed 7/25) and abx. Failed conservative RX w/ evolving shock and coagulopathy. In OR found ruptured sigmoid colon and stool filled abscess in the pelvis. She had sigmoid colectomy w/ colostomy, washout and pelvic drain placed. Returned to ICU mottled on pressors and mechanical ventilator.   History of present illness   79 year old female admitted 7/24 w/ perforated sigmoid diverticulitis and pneumoperitoneum. She was placed on IV antibiotics and IR was consulted and placed percutaneous drain in LLQ abd abscess on 7/25. On 7/26 was having nausea and drain site as well as low back pain in am. AM/night shift 7/27 BP 70s. Treated w/ conservative fluid resuscitation. WBC up from 8.3 to 17.3. new kidney injury. BP remained low. Later in am more confused, mottled, lactic acid was up and was coagulopathic. She was given IV vitamin K and FFP then went to OR. She went for exploratory lap. Found to have ruptured sigmoid colon and stool filled abscess in the pelvis. She had sigmoid colectomy w/ colostomy, washout and pelvic drain placed. Returned to ICU mottled on pressors and mechanical ventilator. PCCM asked to a/w care   Past Medical History  Atrial fib, CHF, prior AICD in place, AAA, PVD.   Significant Hospital Events   7/24 admitted. ABX started, surg consulted 7/25 perc drain placed 7/27 confused, mottled, hypotensive and coagulopathic ->to OR; found ruptured sigmoid colon and stool filled abscess in the pelvis. She had sigmoid colectomy w/ colostomy, washout and pelvic drain placed. Returned to ICU mottled on pressors and mechanical ventilator.   Consults:  Surgery 7/24 IR  7/24  Procedures:  ETT 7/27 >> Rt IJ double lumen CVL 7/27 >>   Significant Diagnostic Tests:    7/24 CT renal >> Perforated sigmoid diverticulitis with moderate free intraperitoneal gas and development of to pericolonic abscesses within the pelvis. No evidence of obstruction. Progressive dependent body wall edema  7/27 CT abd/pelvis >> 1. There is interval placement of percutaneous drainage catheter into the large left lower quadrant fluid collection noted on prior exam. This fluid collection appears to be nearly completely decompressed. Smaller fluid collection is seen adjacent to the sigmoid colon. Findings are consistent with sigmoid diverticulitis.  2. Continued presence of a significant amount of pneumoperitoneum seen in the upper portion of the abdomen consistent with bowel perforation.3. Small bilateral pleural effusions are noted with adjacent subsegmental atelectasis.4. Status post stent graft repair of infrarenal abdominal aortic aneurysm.5. Mild anasarca.  Micro Data:  Surgical wound 7/25: rare gpr>> BCX2 7/27>>>  Antimicrobials:  Zosyn7/24>>7/26 Flagyl 7/26>>> Cefepime 7/26>>> vanc 7/27>>>  Interim history/subjective:  Remains on vent, pressors.  Objective   Blood pressure (!) 140/61, pulse 70, temperature 97.9 F (36.6 C), resp. rate (!) 0, height 5\' 3"  (1.6 m), weight 84.6 kg, SpO2 97 %. CVP:  [16 mmHg-17 mmHg] 17 mmHg  Vent Mode: PRVC FiO2 (%):  [40 %-100 %] 40 % Set Rate:  [14 bmp-20 bmp] 20 bmp Vt Set:  [420 mL] 420 mL PEEP:  [5 cmH20] 5 cmH20 Plateau Pressure:  [22 cmH20-27 cmH20] 22 cmH20   Intake/Output Summary (Last 24 hours) at 10/10/2019 0912 Last data filed at 10/10/2019 0800 Gross per 24 hour  Intake 6720.14  ml  Output 876 ml  Net 5844.14 ml   Filed Weights   09/26/2019 1029 10/08/19 0500 09/30/2019 1012  Weight: 80.7 kg 84.6 kg 84.6 kg    Examination:  General - sedated Eyes - pupils reactive ENT - ETT in place Cardiac - irregular Chest -  equal breath sounds b/l, no wheezing or rales Abdomen - wound dressing clean, JP drain and colostomy in place Extremities - toes b/l dusky, DP pulses palpable Skin - no rashes Neuro - RASS -1  Resolved Hospital Problem list     Assessment & Plan:   Septic shock from peritonitis 2nd to perforated sigmoid diverticulitis. - wean pressors to keep MAP > 65 - continue IV fluids - continue ABx - if unable to wean off pressors soon, then add solu cortef for relative adrenal insufficiency (cortisol 18 from 10/10/19)  Acute hypoxic, hypercapnic respiratory failure in setting of sepsis. - pressure support wean as tolerated - not ready for extubation trial yet - f/u CXR  S/p partial colectomy with colostomy. - post op care, nutrition per CCS  AKI from ATN in setting of sepsis. - baseline creatinine 1.34 from 09/28/19 - f/u BMET, monitor urine outpt - no indication for renal replacement at this time  Metabolic acidosis with lactic acidosis. - resolved - d/c HCO3 from IV fluid  Acute metabolic encephalopathy from sepsis. - RASS goal 0 to -1  Hx of A fib present on admission. Chronic diastolic CHF. - resume anticoagulation when okay with surgery  Hx of hypothyroidism. - continue synthroid IV  Anemia of critical illness. - f/u CBC - transfuse for Hb < 7 or significant bleeding  Dusky toes. - likely from being on pressors - monitor for now   Best practice:  Diet: NPO DVT prophylaxis: SCD GI prophylaxis: PPI Mobility: BR Code Status: full code  Disposition: ICU care   Labs    CMP Latest Ref Rng & Units 10/10/2019 10/10/2019 09/27/2019  Glucose 70 - 99 mg/dL 137(H) - 101(H)  BUN 8 - 23 mg/dL 45(H) - 39(H)  Creatinine 0.44 - 1.00 mg/dL 2.98(H) - 2.60(H)  Sodium 135 - 145 mmol/L 136 133(L) 134(L)  Potassium 3.5 - 5.1 mmol/L 4.3 4.3 4.6  Chloride 98 - 111 mmol/L 99 - 101  CO2 22 - 32 mmol/L 20(L) - 14(L)  Calcium 8.9 - 10.3 mg/dL 7.6(L) - 8.3(L)  Total Protein 6.5 - 8.1  g/dL 4.5(L) - -  Total Bilirubin 0.3 - 1.2 mg/dL 1.7(H) - -  Alkaline Phos 38 - 126 U/L 71 - -  AST 15 - 41 U/L 266(H) - -  ALT 0 - 44 U/L 91(H) - -   CBC Latest Ref Rng & Units 10/10/2019 10/10/2019 10/08/2019  WBC 4.0 - 10.5 K/uL 19.4(H) - 14.5(H)  Hemoglobin 12.0 - 15.0 g/dL 9.7(L) 9.9(L) 9.1(L)  Hematocrit 36 - 46 % 30.5(L) 29.0(L) 29.8(L)  Platelets 150 - 400 K/uL 219 - 225    ABG    Component Value Date/Time   PHART 7.392 10/10/2019 0331   PCO2ART 36.9 10/10/2019 0331   PO2ART 91 10/10/2019 0331   HCO3 22.3 10/10/2019 0331   TCO2 23 10/10/2019 0331   ACIDBASEDEF 2.0 10/10/2019 0331   O2SAT 97.0 10/10/2019 0331    CBG (last 3)  Recent Labs    10/10/19 0031 10/10/19 0417 10/10/19 0758  GLUCAP 141* 125* 157*    Critical care time: 37 minutes  Chesley Mires, MD [place] Pager - [phone] - [phone] 10/10/2019, 9:22 AM

## 2019-10-10 NOTE — Progress Notes (Signed)
1 Day Post-Op   Subjective/Chief Complaint: Remains intubated Low UOP   Objective: Vital signs in last 24 hours: Temp:  [91.9 F (33.3 C)-100.2 F (37.9 C)] 100.2 F (37.9 C) (07/28 0048) Pulse Rate:  [68-94] 68 (07/28 0400) Resp:  [16-25] 24 (07/28 0400) BP: (80-140)/(43-61) 140/61 (07/27 1536) SpO2:  [93 %-100 %] 93 % (07/28 0400) Arterial Line BP: (93-154)/(44-63) 139/51 (07/28 0400) FiO2 (%):  [40 %-100 %] 40 % (07/28 0324) Weight:  [84.6 kg] 84.6 kg (07/27 1012) Last BM Date: 10/07/19  Intake/Output from previous day: 07/27 0701 - 07/28 0700 In: 6178.9 [I.V.:4054.4; Blood:1319; IV Piggyback:805.4] Out: 027 [Urine:130; Drains:90; Stool:300; Blood:350] Intake/Output this shift: No intake/output data recorded.  Exam: Intubated Eyes open, moving arms Abdomen obese, soft, JP serosang Ostomy viable, liquid stool in bag, dressing dry  Lab Results:  Recent Labs    09/25/2019 1505 09/16/2019 1505 10/10/19 0331 10/10/19 0353  WBC 14.5*  --   --  19.4*  HGB 9.1*   < > 9.9* 9.7*  HCT 29.8*   < > 29.0* 30.5*  PLT 225  --   --  219   < > = values in this interval not displayed.   BMET Recent Labs    10/04/2019 1505 10/10/2019 1505 10/10/19 0331 10/10/19 0353  NA 134*   < > 133* 136  K 4.6   < > 4.3 4.3  CL 101  --   --  99  CO2 14*  --   --  20*  GLUCOSE 101*  --   --  137*  BUN 39*  --   --  45*  CREATININE 2.60*  --   --  2.98*  CALCIUM 8.3*  --   --  7.6*   < > = values in this interval not displayed.   PT/INR Recent Labs    10/02/2019 0810 10/10/19 0353  LABPROT 52.8* 22.5*  INR 6.1* 2.1*   ABG Recent Labs    10/10/2019 1308 10/10/19 0331  PHART 7.015* 7.392  HCO3 14.9* 22.3    Studies/Results: CT ABDOMEN PELVIS WO CONTRAST  Result Date: 09/24/2019 CLINICAL DATA:  Status post percutaneous drain placement for abscess. EXAM: CT ABDOMEN AND PELVIS WITHOUT CONTRAST TECHNIQUE: Multidetector CT imaging of the abdomen and pelvis was performed following the  standard protocol without IV contrast. COMPARISON:  October 06, 2019. FINDINGS: Lower chest: Small bilateral pleural effusions are noted with adjacent subsegmental atelectasis. Hepatobiliary: No focal liver abnormality is seen. Status post cholecystectomy. No biliary dilatation. Pancreas: Unremarkable. No pancreatic ductal dilatation or surrounding inflammatory changes. Spleen: Normal in size without focal abnormality. Adrenals/Urinary Tract: Adrenal glands are unremarkable. Kidneys are normal, without renal calculi, focal lesion, or hydronephrosis. Bladder is unremarkable. Stomach/Bowel: The stomach appears normal. There is interval placement of percutaneous drainage catheter into the large left lower quadrant fluid collection noted on prior exam. This fluid collection appears to be nearly completely decompressed. Smaller fluid collection measuring 3.8 x 2.1 cm is seen adjacent to the sigmoid colon. Findings are consistent with sigmoid diverticulitis. There is no evidence of bowel obstruction. Vascular/Lymphatic: Status post stent graft repair of infrarenal abdominal aortic aneurysm. No significant adenopathy is noted. Reproductive: Uterus and bilateral adnexa are unremarkable. Other: Mild anasarca is noted. Continued presence of a significant amount of pneumoperitoneum seen in the upper portion of the abdomen consistent with bowel perforation. Musculoskeletal: No acute or significant osseous findings. IMPRESSION: 1. There is interval placement of percutaneous drainage catheter into the large left lower quadrant  fluid collection noted on prior exam. This fluid collection appears to be nearly completely decompressed. Smaller fluid collection is seen adjacent to the sigmoid colon. Findings are consistent with sigmoid diverticulitis. 2. Continued presence of a significant amount of pneumoperitoneum seen in the upper portion of the abdomen consistent with bowel perforation. 3. Small bilateral pleural effusions are noted  with adjacent subsegmental atelectasis. 4. Status post stent graft repair of infrarenal abdominal aortic aneurysm. 5. Mild anasarca. Electronically Signed   By: Marijo Conception M.D.   On: 10/08/2019 09:31   DG Chest Port 1 View  Result Date: 10/01/2019 CLINICAL DATA:  Endotracheal tumor verification EXAM: PORTABLE CHEST 1 VIEW COMPARISON:  09/06/2019 FINDINGS: Endotracheal tube is at the carina and retraction is recommended. Possible right IJ line overlying the SVC. Left basilar atelectasis/consolidation. Possible small left pleural effusion. Cardiomegaly. Left chest wall ICD. IMPRESSION: Endotracheal tube is at the carina and traction is recommended for more optimal positioning. Left basilar atelectasis/consolidation. Possible small left pleural effusion. Cardiomegaly. Electronically Signed   By: Macy Mis M.D.   On: 09/25/2019 18:37    Anti-infectives: Anti-infectives (From admission, onward)   Start     Dose/Rate Route Frequency Ordered Stop   10/11/19 0400  vancomycin (VANCOCIN) IVPB 1000 mg/200 mL premix     Discontinue     1,000 mg 200 mL/hr over 60 Minutes Intravenous Every 48 hours 09/25/2019 0535     10/10/19 0600  ceFEPIme (MAXIPIME) 2 g in sodium chloride 0.9 % 100 mL IVPB     Discontinue     2 g 200 mL/hr over 30 Minutes Intravenous Every 24 hours 09/17/2019 0535     09/26/2019 0600  metroNIDAZOLE (FLAGYL) IVPB 500 mg     Discontinue     500 mg 100 mL/hr over 60 Minutes Intravenous Every 8 hours 09/18/2019 0516     10/08/2019 0530  ceFEPIme (MAXIPIME) 2 g in sodium chloride 0.9 % 100 mL IVPB        2 g 200 mL/hr over 30 Minutes Intravenous  Once 09/15/2019 0516 09/14/2019 0713   09/16/2019 0530  vancomycin (VANCOREADY) IVPB 1500 mg/300 mL        1,500 mg 150 mL/hr over 120 Minutes Intravenous  Once 09/24/2019 0516 10/12/2019 0931   10/07/19 1400  piperacillin-tazobactam (ZOSYN) IVPB 2.25 g  Status:  Discontinued        2.25 g 100 mL/hr over 30 Minutes Intravenous Every 8 hours 10/07/19 1137  10/11/2019 0516   10/10/2019 2230  piperacillin-tazobactam (ZOSYN) IVPB 3.375 g  Status:  Discontinued        3.375 g 100 mL/hr over 30 Minutes Intravenous Every 8 hours 10/03/2019 2212 10/07/19 1137   09/30/2019 1900  piperacillin-tazobactam (ZOSYN) IVPB 3.375 g  Status:  Discontinued        3.375 g 100 mL/hr over 30 Minutes Intravenous  Once 10/07/2019 1846 09/29/2019 2218      Assessment/Plan: s/p Procedure(s): EXPLORATORY LAPAROTOMY; SIGMOID COLECTOMY WITH COLOSTOMY (N/A) DRAIN REMOVAL (Left) DRAINAGE PELVIC ABSCESS (N/A)  Sepsis, shock from perforated diverticulitis, remains in critical condition Still with lactic acidosis Renal failure Continuing IV antibiotics, resuscitation Start dressing changes INF down Hgb stable    LOS: 4 days    Coralie Keens MD 10/10/2019

## 2019-10-10 NOTE — Consult Note (Signed)
Consultation Note Date: 10/10/2019   Patient Name: Katie [name]  DOB: January 27, 1941  MRN: [id number]  Age / Sex: 79 y.o., female  PCP: Katie Pinto, MD Referring Physician: Chesley Mires, MD  Reason for Consultation: Establishing goals of care  HPI/Patient Profile: 79 y.o. female  with past medical history of diastolic CHF, COPD on HS oxygen, atrial fibrillation, AICD, PVD, pneumonia, osteopenia, hypothyroidism, HTN, HLD, depression, AAA  admitted on 09/26/2019 after AKI found by PCP. Recent hospitalization for GI bleeding and diverticulitis, started on antibiotics. This admission, patient found to have perforated sigmoid diverticulitis treated conservatively with percutaneous drain placed 7/25 and antibiotics. Clinical worsening on 7/27, taken to OR and found ruptured sigmoid colon and stool filled abscess in the pelves s/p sigmoid colectomy with colostomy, washout and pelvic drain placement. Returned to ICU mottled on pressors and mechanical ventilation. 7/28 worsening creatinine 2.98 with poor urine output. Lactic acid trending down, 2.6. Palliative medicine consultation for goals of care.   Clinical Assessment and Goals of Care:  I have reviewed medical records, discussed with care team and met with patient's daughter Katie [name]) in conference room to discuss goals of care.   I introduced Palliative Medicine as specialized medical care for people living with serious illness. It focuses on providing relief from the symptoms and stress of a serious illness. The goal is to improve quality of life for both the patient and the family.  First explored if her mother has HCPOA documentation. Katie [name] has called [employer] and the patient's cardiologist and there is not documented HCPOA paperwork like she thought. The patient's husband Katie [name]) is [name]'s stepfather. She shares that he suffered a traumatic brain injury many  years ago and can assist with medical decision making but may not completely understand complexity of decisions. The patient's son is unfortunately minimally involved and with current ETOH abuse. The patient's other daughter, Katie [name] is currently ill and dealing with medical issues herself. The patient's husband told Katie [name] 'you three are next of kin and make decisions.'  Katie [name] is primary emergency contact and spokesperson for Katie [name]. Katie [name] and I did speak with Katie [name] during [place] discussion to get his input. There are healthcare professionals in the family, therefore it is also important for Katie [name] to talk with her nieces.   We discussed a brief life review of the patient. Married to Katie [name] for >40 years. Three children. Prior to 2 years ago, her mother was active and independent. Leading up admission, still independent and able to complete ADL's but per [facility] with declining health (chronic COPD, CHF, arthritis) and declining quality of life in the past few years. Recurrent hospital admissions. Recently, the patient has been under significant stress because daughter Katie [name] was in ICU for 8 weeks.   Discussed events leading up to admission and course of hospitalization in detail including diagnoses, interventions, plan of care, guarded prognosis that she remains in critical condition. Discussed hoping for the best but also preparing for the worst.   I attempted to elicit values and  goals of care important to the patient. Concepts specific to code status were discussed. Katie [name] shares that her mother's PCP has encouraged her to complete a DNR for many years now, with chronic conditions. This was always a difficult topic for Katie [name]. Medically recommended limited code including NO CPR if her mother's condition took a turn for the worst, with fear that she will not survive heroic measures at EOL with underlying chronic conditions, acute critical illness, frailty, and poor outcomes of CPR/meaningful quality of  life.   Katie [name] shares that her mother was at [employer] for 3 days and demanded she bring her home. Katie [name] knows her mother would not wish to live in a nursing home, dependent on others. She would NOT want prolonged life-support measures if no signs of meaningful recovery. Katie [name] does not think we should perform CPR if her mother's heart stopped.   At this time, we called Katie [name] on the phone. I updated him on his wife's condition and answered questions/concerns regarding diagnoses, interventions, plan of care, guarded prognosis. Asked Katie [name]'s thoughts on CPR for his wife. Mr [name] states "for what?" and makes comments about her being a vegetable if she survived. He states this is "up to the good Lord" when it is her time to go. He will heal her if this is his plan. After confirmation of his thoughts on cpr, this NP confirms that patient's husband does NOT wish for CPR to be performed in the event of cardiac arrest. Katie [name] agrees with NO CPR.   Katie [name] also agrees that his wife would not wish to live in a nursing home, dependent on others. Answered questions for husband and reassured of ongoing palliative support.   At this time, Katie [name] would like to make her mother a limited code: NO CPR but continue yes for other options until she further discusses with medical professionals in the family.  Discussed ongoing current plan of care, medical management, watchful waiting and time for outcomes. Reassured of ongoing support from PMT. PMT contact information and Hard Choices booklet given.   Updated RN, Katie [name].     SUMMARY OF RECOMMENDATIONS    Patient does not have a documented living will/POA. Patient's husband Katie [name]) is default POA but unfortunately has suffered a TBI and may not fully understand complexity of decision making. Needs daughter Katie [name]) for assist with decisions. Daughter is very involved in her mother's care and talks with husband (her stepfather) frequently. This  PMT provider did speak with husband and daughter for [place] today.   Code status changed to limited: NO CHEST COMPRESSIONS in the event of cardiac arrest. Otherwise full scope treatment. Daughter to further discuss full DNR code status with family.   Continue current plan of care and medical management. Watchful waiting and time for outcomes.   Ongoing palliative discussions pending clinical course. Tenuous. Daughter and husband both share that this patient would not wish to live in a nursing home, dependent on others. If not clinically improving, they would likely be open to further comfort/hospice discussions.   Code Status/Advance Care Planning:  Limited code: NO CPR per husband and daughter's wishes.  Daughter to further discuss full DNR status with family.   Symptom Management:   Per attending  Palliative Prophylaxis:   Aspiration, Bowel Regimen, Delirium Protocol, Frequent Pain Assessment, Oral Care and Turn Reposition  Psycho-social/Spiritual:   Desire for further Chaplaincy support: yes  Additional Recommendations: Caregiving  Support/Resources, Compassionate Geneticist, molecular and Education on Hospice  Prognosis:   Guarded  Discharge Planning: To Be Determined      Primary Diagnoses: Present on Admission:  Perforated diverticulum  Essential hypertension  Atrial fibrillation (HCC)  Chronic combined systolic and diastolic CHF (congestive heart failure) (HCC)  COPD (chronic obstructive pulmonary disease) (HCC)  Gastroesophageal reflux disease without esophagitis  CKD (chronic kidney disease) stage 3, GFR 30-59 ml/min  Hyperlipidemia, mixed  AAA (abdominal aortic aneurysm) without rupture (HCC)  PVD (peripheral vascular disease) with claudication (HCC)  Gout  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 The following aspects are pertinent.  Past Medical History:  Diagnosis Date   AAA (abdominal aortic aneurysm) ([place])    AICD (automatic cardioverter/defibrillator) present 11/10/2017   Arthritis    "some in my knees" (11/10/2017)   Atrial fibrillation (HCC)    CHF (congestive heart failure) (HCC)    Chronic bronchitis (HCC)    COPD (chronic obstructive pulmonary disease) (HCC)    Depression    GERD (gastroesophageal reflux disease)    Gout    "daily RX" (11/10/2017)   History of hiatal hernia    Hyperlipidemia    Hypertension    Hypothyroid    Migraine headache    "hx; none since 1980s" (11/10/2017)   Osteopenia    Pneumonia    "~ 3 times" (11/10/2017)   PVD (peripheral vascular disease) ([place])    Social History   Socioeconomic History   Marital status: Married    Spouse name: Not on file   Number of children: Not on file   Years of education: Not on file   Highest education level: Not on file  Occupational History   Not on file  Tobacco Use   Smoking status: Former Smoker    Packs/day: 2.00    Years: 54.00    Pack years: 108.00    Types: Cigarettes    Quit date: 08/10/2002    Years since quitting: 17.1   Smokeless tobacco: Never Used  Vaping Use   Vaping Use: Never used  Substance and Sexual Activity   Alcohol use: Never   Drug use: Never   Sexual activity: Not Currently    Birth control/protection: Post-menopausal  Other Topics Concern   Not on file  Social History Narrative   Lives in [place] with husband   One daughter   Social Determinants of Health   Financial Resource Strain: Low Risk    Difficulty of Paying Living Expenses: Not hard at all  Food Insecurity: No Food Insecurity   Worried About Charity fundraiser in the Last Year: Never true   Arboriculturist in the Last Year: Never true  Transportation Needs: No Transportation Needs   Lack of Transportation (Medical): No   Lack of  Transportation (Non-Medical): No  Physical Activity: Inactive   Days of Exercise per Week: 0 days   Minutes of Exercise per Session: 0 min  Stress: No Stress Concern Present   Feeling of Stress : Not at all  Social Connections: Socially Integrated   Frequency of Communication with Friends and Family: More than three times a week   Frequency of Social Gatherings with Friends and Family: More than three times a week   Attends Religious Services: More than 4 times per year   Active Member of [employer] or Organizations: Yes   Attends Archivist Meetings: More than  4 times per year   Marital Status: Married   Family History  Problem Relation Age of Onset   Cirrhosis Mother    Cancer Mother 39       PANCREAS   Heart defect Sister    Breast cancer Sister        age 36   Heart disease Sister    Stroke Sister    Alcohol abuse Father    Depression Father    Hypertension Brother    Hyperlipidemia Son    Heart disease Daughter    Scheduled Meds:  Chlorhexidine Gluconate Cloth  6 each Topical Daily   [START ON 10/12/2019] levothyroxine  62.5 mcg Intravenous Daily   pantoprazole (PROTONIX) IV  40 mg Intravenous Q24H   sodium chloride flush  10-40 mL Intracatheter Q12H   Continuous Infusions:  ceFEPime (MAXIPIME) IV Stopped (10/10/19 0713)   fentaNYL infusion INTRAVENOUS 100 mcg/hr (10/10/19 1400)   lactated ringers 75 mL/hr at 10/10/19 1400   metronidazole 500 mg (10/10/19 1458)   norepinephrine (LEVOPHED) Adult infusion Stopped (10/10/19 1016)   propofol (DIPRIVAN) infusion     vasopressin 0.03 Units/min (10/10/19 1400)   PRN Meds:.albuterol, docusate, fentaNYL, fentaNYL (SUBLIMAZE) injection, ondansetron (ZOFRAN) IV, sodium chloride flush Medications Prior to Admission:  Prior to Admission medications   Medication Sig Start Date End Date Taking? Authorizing Provider  acetaminophen (TYLENOL) 500 MG tablet Take 1,000 mg by mouth every 6 (six)  hours as needed for mild pain or moderate pain.   Yes [provider]  albuterol (PROAIR HFA) 108 (90 Base) MCG/ACT inhaler Use 2 Inhalations 15 minutes Apart every 4 hours to Rescue Asthma Attack Patient taking differently: Inhale 2 puffs into the lungs every 4 (four) hours as needed for wheezing or shortness of breath (asthma attack).  07/29/18  Yes Katie Pinto, MD  allopurinol (ZYLOPRIM) 300 MG tablet Take 0.5 tablets (150 mg total) by mouth daily. 09/20/19  Yes Vicie Mutters, PA-C  aspirin EC 81 MG EC tablet Take 1 tablet (81 mg total) by mouth daily with breakfast. Swallow whole. 10/03/19  Yes Emokpae, Courage, MD  budesonide-formoterol (SYMBICORT) 80-4.5 MCG/ACT inhaler Use 2 inhalations 30 minutes apart 2 x /day (every 12 hours) Patient taking differently: Inhale 2 puffs into the lungs daily as needed (for flares).  08/28/19  Yes Katie Pinto, MD  cetirizine (ZYRTEC) 10 MG tablet Take 10 mg by mouth daily as needed for allergies.    Yes [provider]  ciprofloxacin (CIPRO) 500 MG tablet Take 500 mg by mouth 2 (two) times daily with a meal.    Yes [provider]  Eyelid Cleansers (AVENOVA) 0.01 % SOLN Apply 1 application topically daily.  11/28/17  Yes [provider]  famotidine (PEPCID) 40 MG tablet Take 1 tablet Daily to Prevent Heartburn & Indigestion Patient taking differently: Take 40 mg by mouth daily.  10/03/19  Yes Katie Pinto, MD  ipratropium (ATROVENT) 0.06 % nasal spray Use 1 to 2 sprays each Nostril 2 to 3 x / day as needed Patient taking differently: Place 1-2 sprays into both nostrils 3 (three) times daily as needed for rhinitis (or congestion).  02/27/19 02/28/20 Yes Katie Pinto, MD  ipratropium-albuterol (DUONEB) 0.5-2.5 (3) MG/3ML SOLN Inhale 3 mLs into the lungs every 6 (six) hours as needed (for shortness of breath or wheezing). 02/27/19  Yes Katie Pinto, MD  leflunomide (ARAVA) 20 MG tablet Take 20 mg by mouth daily.   06/18/19  Yes [provider]  levothyroxine (SYNTHROID)  125 MCG tablet Take 1 tablet (125 mcg total) by mouth daily before breakfast. 08/18/19  Yes Shelly Coss, MD  metroNIDAZOLE (FLAGYL) 500 MG tablet Take 1 tablet 3 x /day with meals for Infection Patient taking differently: Take 500 mg by mouth with breakfast, with lunch, and with evening meal.  10/04/19  Yes Katie Pinto, MD  montelukast (SINGULAIR) 10 MG tablet TAKE 1 TABLET DAILY FOR ALLERGIES & ASTHMA Patient taking differently: Take 10 mg by mouth daily. for Allergies & Asthma 02/27/19  Yes Katie Pinto, MD  OXYGEN Inhale 1 L/min into the lungs at bedtime.    Yes [provider]  Polyethyl Glycol-Propyl Glycol (SYSTANE) 0.4-0.3 % SOLN Place 1 drop into both eyes 5 (five) times daily.    Yes [provider]  pravastatin (PRAVACHOL) 20 MG tablet Take 1 tablet at Bedtime for Cholesterol Patient taking differently: Take 10 mg by mouth every other day.  09/21/19  Yes Katie Pinto, MD  pregabalin (LYRICA) 25 MG capsule Take 1 capsule 2 to 3 x /day as needed for Neuropathy pain Patient taking differently: Take 25 mg by mouth 3 (three) times daily. for neuropathic pain 08/30/19  Yes Katie Pinto, MD  Probiotic Product ([employer] PROBIOTIC) CAPS Take 1 capsule by mouth daily.   Yes [provider]  Rivaroxaban (XARELTO) 15 MG TABS tablet Take 15 mg by mouth every evening.   Yes [provider]  simethicone (MYLICON) 201 MG chewable tablet Chew 125 mg by mouth every 6 (six) hours as needed for flatulence.   Yes [provider]  torsemide (DEMADEX) 20 MG tablet Take 2 tablets (40 mg total) by mouth daily. Starting on Thursday 10/04/19 --- after repeat blood work is available Patient taking differently: Take 40 mg by mouth See admin instructions. Take 2 tablets (40 mg) by mouth twice daily - morning and afternoon (2pm) 10/04/19  Yes Emokpae, Courage, MD  traMADol (ULTRAM) 50 MG tablet Take 50 mg  by mouth 2 (two) times daily as needed (for pain).   Yes [provider]  potassium chloride SA (KLOR-CON) 20 MEQ tablet Take 20 mEq by mouth daily.     [provider]   Allergies  Allergen Reactions   Amiodarone Other (See Comments)    PULMONARY TOXICITY   Diovan [Valsartan] Other (See Comments)    HYPOTENSION   Doxycycline Diarrhea and Other (See Comments)    VISUAL DISTURBANCE   Flexeril [Cyclobenzaprine] Other (See Comments)    FATIGUE   Keflex [Cephalexin] Diarrhea   Verapamil Other (See Comments)    EDEMA   Ciprofloxacin Diarrhea and Nausea And Vomiting   Augmentin [Amoxicillin-Pot Clavulanate] Diarrhea and Nausea And Vomiting    Tolerating piperacillin/tazobactam, previously tolerated ampicillin/sulbactam at prior visit   Codeine Hives   Review of Systems  Unable to perform ROS: Acuity of condition   Physical Exam Vitals and nursing note reviewed.  Constitutional:      General: She is awake.     Interventions: She is sedated and intubated.  HENT:     Head: Normocephalic and atraumatic.  Cardiovascular:     Rate and Rhythm: Normal rate.  Pulmonary:     Effort: No tachypnea, accessory muscle usage or respiratory distress. She is intubated.  Abdominal:     Comments: Dressing C/D/I, colostomy  Skin:    General: Skin is warm.     Comments: Feet mottled  Neurological:     Comments: Eyes open spontaneously, not following commands  Psychiatric:  Behavior: Behavior is uncooperative.     Comments: Agitate requiring mitts and restraints    Vital Signs: BP (!) 140/61    Pulse 71    Temp 98.2 F (36.8 C)    Resp 22    Ht '5\' 3"'  (1.6 m)    Wt 84.6 kg    LMP  (LMP Unknown)    SpO2 97%    BMI 33.04 kg/m  Pain Scale: 0-10   Pain Score: 4    SpO2: SpO2: 97 % O2 Device:SpO2: 97 % O2 Flow Rate: .O2 Flow Rate (L/min): 1 L/min  IO: Intake/output summary:   Intake/Output Summary (Last 24 hours) at 10/10/2019 1630 Last data filed at  10/10/2019 1400 Gross per 24 hour  Intake 3917.11 ml  Output 496 ml  Net 3421.11 ml    LBM: Last BM Date: 10/10/19 Baseline Weight: Weight: 80.7 kg Most recent weight: Weight: 84.6 kg     Palliative Assessment/Data: PPS 30%   Flowsheet Rows     Most Recent Value  Intake Tab  Referral Department Hospitalist  Unit at Time of Referral ICU  Palliative Care Primary Diagnosis Sepsis/Infectious Disease  Palliative Care Type New Palliative care  Reason for referral Clarify Goals of Care  Date first seen by Palliative Care 10/10/19  Clinical Assessment  Palliative Performance Scale Score 30%  Psychosocial & Spiritual Assessment  Palliative Care Outcomes  Patient/Family meeting held? Yes  Who was at the meeting? daughter, husband via telephone  Palliative Care Outcomes Clarified goals of care, Provided end of life care assistance, Provided psychosocial or spiritual support, Changed CPR status, ACP counseling assistance      Time In: 1340 Time Out: 1540 Time Total: 120 Greater than 50%  of this time was spent counseling and coordinating care related to the above assessment and plan.  Signed by:  Ihor Dow, DNP, FNP-C Palliative Medicine Team  Phone: [phone] Fax: [phone]   Please contact Palliative Medicine Team phone at [phone] for questions and concerns.  For individual provider: See Shea Evans

## 2019-10-10 NOTE — Progress Notes (Signed)
[facility] Progress Note Patient Name: Katie [name] DOB: September 08, 1940 MRN: [id number]   Date of Service  10/10/2019  HPI/Events of Note  Request for restraints Patient is intubated and attempts to pull tubes and lines  eICU Interventions  Bilateral soft wrist restraints ordered     Intervention Category Minor Interventions: Agitation / anxiety - evaluation and management  Judd Lien 10/10/2019, 5:06 AM

## 2019-10-10 NOTE — Progress Notes (Signed)
Initial Nutrition Assessment  DOCUMENTATION CODES:   Not applicable  INTERVENTION:    Recommend begin at least trickle tube feedings via OG tube with Vital AF 1.2 at 20 ml/h.  When tolerance established, increase by 10 ml every 4 hours to goal rate of 60 ml/h with Prosource TF 45 ml BID to provide 1808 kcal, 130 gm protein, 1168 ml free water daily.  NUTRITION DIAGNOSIS:   Increased nutrient needs related to acute illness as evidenced by estimated needs.  GOAL:   Patient will meet greater than or equal to 90% of their needs  MONITOR:   Vent status, Labs, I & O's  REASON FOR ASSESSMENT:   Ventilator    ASSESSMENT:   79 yo female admitted with perforated diverticulitis. S/P exploratory laparotomy, sigmoid colectomy with colostomy, and drainage of pelvic abscess on 7/27. PMH includes A fib, osteopenia, HTN, CHF, hypothyroidism, HLD, PVD, COPD.  Patient is currently intubated on ventilator support MV: 13.7 L/min Temp (24hrs), Avg:95.7 F (35.4 C), Min:91.9 F (33.3 C), Max:100.2 F (37.9 C)   Labs reviewed. BUN 45, creat 2.98, phos 7.1, ionized calcium 0.61, lactic acid 5.3-->2.6 trending down. CBG: 125-157  Medications reviewed and include levophed, vasopressin. IVF: LR at 10 ml/h, sodium bicarb at 100 ml/h   Weights reviewed. Patient weighed 77.1 kg on 09/20/19, currently 10% above this weight at 84.6 kg. Weight up d/t fluids.  Moderate generalized edema, mild pitting edema to BLE, and moderate pitting edema to BUE per RN flow sheet.   NUTRITION - FOCUSED PHYSICAL EXAM:  unable to complete  Diet Order:   Diet Order            Diet NPO time specified  Diet effective now                 EDUCATION NEEDS:   Not appropriate for education at this time  Skin:  Skin Assessment: Reviewed RN Assessment (surgical abdominal incision)  Last BM:  7/28 (colostomy)  Height:   Ht Readings from Last 1 Encounters:  10/12/2019 5\' 3"  (1.6 m)    Weight:   Wt  Readings from Last 1 Encounters:  10/08/2019 84.6 kg    Ideal Body Weight:  52.3 kg  Estimated Nutritional Needs:   Kcal:  1725  Protein:  115-130 gm  Fluid:  >/= 1.7 L    Lucas Mallow, RD, LDN, CNSC Please refer to Amion for contact information.

## 2019-10-11 ENCOUNTER — Other Ambulatory Visit (INDEPENDENT_AMBULATORY_CARE_PROVIDER_SITE_OTHER): Payer: Self-pay | Admitting: *Deleted

## 2019-10-11 ENCOUNTER — Ambulatory Visit: Payer: Self-pay | Admitting: *Deleted

## 2019-10-11 ENCOUNTER — Inpatient Hospital Stay (HOSPITAL_COMMUNITY): Payer: Medicare Other

## 2019-10-11 DIAGNOSIS — K5792 Diverticulitis of intestine, part unspecified, without perforation or abscess without bleeding: Secondary | ICD-10-CM

## 2019-10-11 DIAGNOSIS — K922 Gastrointestinal hemorrhage, unspecified: Secondary | ICD-10-CM

## 2019-10-11 DIAGNOSIS — K219 Gastro-esophageal reflux disease without esophagitis: Secondary | ICD-10-CM

## 2019-10-11 DIAGNOSIS — J9621 Acute and chronic respiratory failure with hypoxia: Secondary | ICD-10-CM | POA: Diagnosis not present

## 2019-10-11 DIAGNOSIS — K5780 Diverticulitis of intestine, part unspecified, with perforation and abscess without bleeding: Secondary | ICD-10-CM

## 2019-10-11 LAB — GLUCOSE, CAPILLARY
Glucose-Capillary: 100 mg/dL — ABNORMAL HIGH (ref 70–99)
Glucose-Capillary: 101 mg/dL — ABNORMAL HIGH (ref 70–99)
Glucose-Capillary: 130 mg/dL — ABNORMAL HIGH (ref 70–99)
Glucose-Capillary: 74 mg/dL (ref 70–99)
Glucose-Capillary: 79 mg/dL (ref 70–99)
Glucose-Capillary: 83 mg/dL (ref 70–99)

## 2019-10-11 LAB — BASIC METABOLIC PANEL
Anion gap: 15 (ref 5–15)
BUN: 64 mg/dL — ABNORMAL HIGH (ref 8–23)
CO2: 22 mmol/L (ref 22–32)
Calcium: 7.3 mg/dL — ABNORMAL LOW (ref 8.9–10.3)
Chloride: 101 mmol/L (ref 98–111)
Creatinine, Ser: 3.45 mg/dL — ABNORMAL HIGH (ref 0.44–1.00)
GFR calc Af Amer: 14 mL/min — ABNORMAL LOW (ref >60)
GFR calc non Af Amer: 12 mL/min — ABNORMAL LOW (ref >60)
Glucose, Bld: 125 mg/dL — ABNORMAL HIGH (ref 70–99)
Potassium: 4.7 mmol/L (ref 3.5–5.1)
Sodium: 138 mmol/L (ref 135–145)

## 2019-10-11 LAB — CBC
HCT: 26.8 % — ABNORMAL LOW (ref 36.0–46.0)
Hemoglobin: 8.7 g/dL — ABNORMAL LOW (ref 12.0–15.0)
MCH: 27.8 pg (ref 26.0–34.0)
MCHC: 32.5 g/dL (ref 30.0–36.0)
MCV: 85.6 fL (ref 80.0–100.0)
Platelets: 182 10*3/uL (ref 150–400)
RBC: 3.13 MIL/uL — ABNORMAL LOW (ref 3.87–5.11)
RDW: 18.2 % — ABNORMAL HIGH (ref 11.5–15.5)
WBC: 22.1 10*3/uL — ABNORMAL HIGH (ref 4.0–10.5)
nRBC: 3 % — ABNORMAL HIGH (ref 0.0–0.2)

## 2019-10-11 MED ORDER — FENTANYL CITRATE (PF) 100 MCG/2ML IJ SOLN
25.0000 ug | INTRAMUSCULAR | Status: DC | PRN
  Administered 2019-10-11 (×2): 25 ug via INTRAVENOUS
  Filled 2019-10-11 (×2): qty 2

## 2019-10-11 MED ORDER — FUROSEMIDE 10 MG/ML IJ SOLN
40.0000 mg | Freq: Four times a day (QID) | INTRAMUSCULAR | Status: AC
  Administered 2019-10-11 (×2): 40 mg via INTRAVENOUS
  Filled 2019-10-11 (×2): qty 4

## 2019-10-11 MED ORDER — FENTANYL CITRATE (PF) 100 MCG/2ML IJ SOLN
50.0000 ug | INTRAMUSCULAR | Status: DC | PRN
  Administered 2019-10-11 – 2019-10-13 (×3): 50 ug via INTRAVENOUS
  Filled 2019-10-11 (×4): qty 2

## 2019-10-11 NOTE — Progress Notes (Signed)
PT Cancellation Note  Patient Details Name: Katie [name] MRN: [id number] DOB: November 26, 1940   Cancelled Treatment:    Reason Eval/Treat Not Completed: Medical issues which prohibited therapy - pt extubated this am, still on pressors, and per RN is in severe pain. PT to hold per RN request, will check back tomorrow.  [place] Pager [phone]  Office [phone]    [place] 10/11/2019, 2:58 PM

## 2019-10-11 NOTE — Progress Notes (Signed)
2 Days Post-Op   Subjective/Chief Complaint: Remains intubated, levo at 60mcg Low UOP Now with stool in ostomy pouch  Objective: Vital signs in last 24 hours: Temp:  [97.4 F (36.3 C)-98.5 F (36.9 C)] 97.4 F (36.3 C) (07/29 0354) Pulse Rate:  [70-75] 70 (07/29 0800) Resp:  [0-29] 14 (07/29 0800) SpO2:  [95 %-100 %] 98 % (07/29 0800) Arterial Line BP: (97-165)/(45-67) 106/51 (07/29 0800) FiO2 (%):  [40 %] 40 % (07/29 0732) Last BM Date: 10/10/19  Intake/Output from previous day: 07/28 0701 - 07/29 0700 In: 2622.7 [I.V.:2340.6; IV Piggyback:282.1] Out: 169 [Urine:39; Drains:60; Stool:70] Intake/Output this shift: No intake/output data recorded.  Exam: Intubated Eyes open, moving arms Abdomen obese, soft, JP serosang (60 cc/24h) Ostomy viable, soft stool in bag, dressing dry  Lab Results:  Recent Labs    10/10/19 0353 10/11/19 0418  WBC 19.4* 22.1*  HGB 9.7* 8.7*  HCT 30.5* 26.8*  PLT 219 182   BMET Recent Labs    10/10/19 0353 10/11/19 0418  NA 136 138  K 4.3 4.7  CL 99 101  CO2 20* 22  GLUCOSE 137* 125*  BUN 45* 64*  CREATININE 2.98* 3.45*  CALCIUM 7.6* 7.3*   PT/INR Recent Labs    09/24/2019 0810 10/10/19 0353  LABPROT 52.8* 22.5*  INR 6.1* 2.1*   ABG Recent Labs    09/14/2019 1308 10/10/19 0331  PHART 7.015* 7.392  HCO3 14.9* 22.3    Studies/Results: CT ABDOMEN PELVIS WO CONTRAST  Result Date: 10/02/2019 CLINICAL DATA:  Status post percutaneous drain placement for abscess. EXAM: CT ABDOMEN AND PELVIS WITHOUT CONTRAST TECHNIQUE: Multidetector CT imaging of the abdomen and pelvis was performed following the standard protocol without IV contrast. COMPARISON:  October 06, 2019. FINDINGS: Lower chest: Small bilateral pleural effusions are noted with adjacent subsegmental atelectasis. Hepatobiliary: No focal liver abnormality is seen. Status post cholecystectomy. No biliary dilatation. Pancreas: Unremarkable. No pancreatic ductal dilatation or  surrounding inflammatory changes. Spleen: Normal in size without focal abnormality. Adrenals/Urinary Tract: Adrenal glands are unremarkable. Kidneys are normal, without renal calculi, focal lesion, or hydronephrosis. Bladder is unremarkable. Stomach/Bowel: The stomach appears normal. There is interval placement of percutaneous drainage catheter into the large left lower quadrant fluid collection noted on prior exam. This fluid collection appears to be nearly completely decompressed. Smaller fluid collection measuring 3.8 x 2.1 cm is seen adjacent to the sigmoid colon. Findings are consistent with sigmoid diverticulitis. There is no evidence of bowel obstruction. Vascular/Lymphatic: Status post stent graft repair of infrarenal abdominal aortic aneurysm. No significant adenopathy is noted. Reproductive: Uterus and bilateral adnexa are unremarkable. Other: Mild anasarca is noted. Continued presence of a significant amount of pneumoperitoneum seen in the upper portion of the abdomen consistent with bowel perforation. Musculoskeletal: No acute or significant osseous findings. IMPRESSION: 1. There is interval placement of percutaneous drainage catheter into the large left lower quadrant fluid collection noted on prior exam. This fluid collection appears to be nearly completely decompressed. Smaller fluid collection is seen adjacent to the sigmoid colon. Findings are consistent with sigmoid diverticulitis. 2. Continued presence of a significant amount of pneumoperitoneum seen in the upper portion of the abdomen consistent with bowel perforation. 3. Small bilateral pleural effusions are noted with adjacent subsegmental atelectasis. 4. Status post stent graft repair of infrarenal abdominal aortic aneurysm. 5. Mild anasarca. Electronically Signed   By: Marijo Conception M.D.   On: 10/12/2019 09:31   DG Chest Port 1 View  Result Date: 10/11/2019 CLINICAL  DATA:  Respiratory failure. EXAM: PORTABLE CHEST 1 VIEW COMPARISON:   09/24/2019. FINDINGS: Endotracheal tube has been slightly withdrawn, its tip is 1.5 cm above the carina. Interim placement of NG tube, tip below left hemidiaphragm. Right IJ line stable position. AICD in stable position. Stable cardiomegaly. Bibasilar pulmonary infiltrates/edema noted. Left lower lobe atelectatic changes present. No prominent pleural effusion or pneumothorax. IMPRESSION: 1. Endotracheal tube is been slightly withdrawn, its tip is 1.5 cm above the carina. Interim placement of NG tube, tip below left hemidiaphragm. Right IJ line stable position. 2.  AICD in stable position.  Stable cardiomegaly. 3. Bibasilar pulmonary infiltrates/edema noted. Left lower lobe atelectatic changes present. Electronically Signed   By: Marcello Moores  Register   On: 10/11/2019 06:08   DG Chest Port 1 View  Result Date: 09/29/2019 CLINICAL DATA:  Endotracheal tumor verification EXAM: PORTABLE CHEST 1 VIEW COMPARISON:  09/06/2019 FINDINGS: Endotracheal tube is at the carina and retraction is recommended. Possible right IJ line overlying the SVC. Left basilar atelectasis/consolidation. Possible small left pleural effusion. Cardiomegaly. Left chest wall ICD. IMPRESSION: Endotracheal tube is at the carina and traction is recommended for more optimal positioning. Left basilar atelectasis/consolidation. Possible small left pleural effusion. Cardiomegaly. Electronically Signed   By: Macy Mis M.D.   On: 09/20/2019 18:37   DG Abd Portable 1V  Result Date: 10/10/2019 CLINICAL DATA:  Orogastric tube placement EXAM: PORTABLE ABDOMEN - 1 VIEW COMPARISON:  CT abdomen and pelvis October 09, 2019 FINDINGS: Orogastric tube tip and side port in stomach. No appreciable bowel dilatation or air-fluid level on supine examination. No free air appreciable on supine examination. Consolidation and pleural effusion noted left base. IMPRESSION: Orogastric tube tip and side port in stomach. No bowel obstruction or free air evident on supine  examination. Left lung base pleural effusion and consolidation noted. Electronically Signed   By: Lowella Grip III M.D.   On: 10/10/2019 07:37    Anti-infectives: Anti-infectives (From admission, onward)   Start     Dose/Rate Route Frequency Ordered Stop   10/11/19 0400  vancomycin (VANCOCIN) IVPB 1000 mg/200 mL premix  Status:  Discontinued        1,000 mg 200 mL/hr over 60 Minutes Intravenous Every 48 hours 09/13/2019 0535 10/10/19 0925   10/10/19 0600  ceFEPIme (MAXIPIME) 2 g in sodium chloride 0.9 % 100 mL IVPB     Discontinue     2 g 200 mL/hr over 30 Minutes Intravenous Every 24 hours 09/18/2019 0535     10/11/2019 0600  metroNIDAZOLE (FLAGYL) IVPB 500 mg     Discontinue     500 mg 100 mL/hr over 60 Minutes Intravenous Every 8 hours 10/07/2019 0516     10/11/2019 0530  ceFEPIme (MAXIPIME) 2 g in sodium chloride 0.9 % 100 mL IVPB        2 g 200 mL/hr over 30 Minutes Intravenous  Once 09/20/2019 0516 10/01/2019 0713   09/25/2019 0530  vancomycin (VANCOREADY) IVPB 1500 mg/300 mL        1,500 mg 150 mL/hr over 120 Minutes Intravenous  Once 10/08/2019 0516 10/07/2019 0931   10/07/19 1400  piperacillin-tazobactam (ZOSYN) IVPB 2.25 g  Status:  Discontinued        2.25 g 100 mL/hr over 30 Minutes Intravenous Every 8 hours 10/07/19 1137 09/28/2019 0516   09/13/2019 2230  piperacillin-tazobactam (ZOSYN) IVPB 3.375 g  Status:  Discontinued        3.375 g 100 mL/hr over 30 Minutes Intravenous Every 8 hours 09/16/2019  2212 10/07/19 1137   09/21/2019 1900  piperacillin-tazobactam (ZOSYN) IVPB 3.375 g  Status:  Discontinued        3.375 g 100 mL/hr over 30 Minutes Intravenous  Once 10/09/2019 1846 10/03/2019 2218      Assessment/Plan: Acute hypoxic respiratory failure AKI - BUN 64, Cr 0.92 Metabolic acidosis afib on xarelto at home, xarelto held Chronic diastolic HF HTN HLD Hypothyroidism Depression   Septic shock 2/2 perforated diverticulitis  s/p Procedure(s): EXPLORATORY LAPAROTOMY; SIGMOID COLECTOMY  WITH COLOSTOMY (N/A) DRAIN REMOVAL (Left) DRAINAGE PELVIC ABSCESS (N/A) -POD#2, afebrile, WBC 22.1 from 19.4, hgb 8.7 from 9.7  - remains critically ill on levo, minimal urine output, lactate trending down - continue IV abx - ok to start trickle TF - BID wet-to-dry dressing changes   FEN: NPO, IVF, ok to start trickle TF from CCS perspective  ID: Zosyn 7/24-26, cefepime 7/26 >>, flagyl 7/26 >>, Vanc 7/27 >>  VTE: PAS Foley: In place, oliguria  Dispo: ICU Code status: limited code, NO CPR, continue other measures.   LOS: 5 days    Jill Alexanders MD 10/11/2019

## 2019-10-11 NOTE — Progress Notes (Signed)
NAME:  Katie [name], MRN:  [id number], DOB:  12-25-1940, LOS: 5 ADMISSION DATE:  10/08/2019, CONSULTATION DATE:  7/27 REFERRING MD:  Ninfa Linden, CHIEF COMPLAINT:  Septic shock and coagulopathy    Brief History   79 year old female admitted 7/24 w/ perf sigmoid diverticulitis.Treated conservatively w/ percutaneous drain (placed 7/25) and abx. Failed conservative RX w/ evolving shock and coagulopathy. In OR found ruptured sigmoid colon and stool filled abscess in the pelvis. She had sigmoid colectomy w/ colostomy, washout and pelvic drain placed. Returned to ICU mottled on pressors and mechanical ventilator.   History of present illness   79 year old female admitted 7/24 w/ perforated sigmoid diverticulitis and pneumoperitoneum. She was placed on IV antibiotics and IR was consulted and placed percutaneous drain in LLQ abd abscess on 7/25. On 7/26 was having nausea and drain site as well as low back pain in am. AM/night shift 7/27 BP 70s. Treated w/ conservative fluid resuscitation. WBC up from 8.3 to 17.3. new kidney injury. BP remained low. Later in am more confused, mottled, lactic acid was up and was coagulopathic. She was given IV vitamin K and FFP then went to OR. She went for exploratory lap. Found to have ruptured sigmoid colon and stool filled abscess in the pelvis. She had sigmoid colectomy w/ colostomy, washout and pelvic drain placed. Returned to ICU mottled on pressors and mechanical ventilator. PCCM asked to a/w care   Past Medical History  Atrial fib, CHF, prior AICD in place, AAA, PVD.   Significant Hospital Events   7/24 admitted. ABX started, surg consulted 7/25 perc drain placed 7/27 confused, mottled, hypotensive and coagulopathic ->to OR; found ruptured sigmoid colon and stool filled abscess in the pelvis. She had sigmoid colectomy w/ colostomy, washout and pelvic drain placed. Returned to ICU mottled on pressors and mechanical ventilator.   Consults:  Surgery 7/24 IR  7/24  Procedures:  ETT 7/27 >> 7/29 Rt IJ double lumen CVL 7/27 >>   Significant Diagnostic Tests:    7/24 CT renal >> Perforated sigmoid diverticulitis with moderate free intraperitoneal gas and development of to pericolonic abscesses within the pelvis. No evidence of obstruction. Progressive dependent body wall edema  7/27 CT abd/pelvis >> 1. There is interval placement of percutaneous drainage catheter into the large left lower quadrant fluid collection noted on prior exam. This fluid collection appears to be nearly completely decompressed. Smaller fluid collection is seen adjacent to the sigmoid colon. Findings are consistent with sigmoid diverticulitis.  2. Continued presence of a significant amount of pneumoperitoneum seen in the upper portion of the abdomen consistent with bowel perforation.3. Small bilateral pleural effusions are noted with adjacent subsegmental atelectasis.4. Status post stent graft repair of infrarenal abdominal aortic aneurysm.5. Mild anasarca.  Micro Data:  Surgical wound 7/25: rare gpr>> BCX2 7/27>>>  Antimicrobials:  Zosyn7/24>>7/26 Flagyl 7/26>>> Cefepime 7/26>>> vanc 7/27>>> 7/28  Interim history/subjective:  Pressure support.  Objective   Blood pressure (!) 140/61, pulse 70, temperature 97.6 F (36.4 C), temperature source Axillary, resp. rate 14, height '5\' 3"'  (1.6 m), weight 84.6 kg, SpO2 98 %.    Vent Mode: PSV;CPAP FiO2 (%):  [40 %] 40 % Set Rate:  [20 bmp] 20 bmp Vt Set:  [420 mL] 420 mL PEEP:  [5 cmH20] 5 cmH20 Pressure Support:  [10 cmH20] 10 cmH20 Plateau Pressure:  [21 cmH20-23 cmH20] 23 cmH20   Intake/Output Summary (Last 24 hours) at 10/11/2019 0856 Last data filed at 10/11/2019 0600 Gross per 24 hour  Intake 2427.15 ml  Output 169 ml  Net 2258.15 ml   Filed Weights   09/15/2019 1029 10/08/19 0500 10/08/2019 1012  Weight: 80.7 kg 84.6 kg 84.6 kg    Examination:  General - sedated Eyes - pupils reactive ENT - ETT in  place Cardiac - regular rate/rhythm, 2/6 SM Chest - decreased BS at bases Lt > Rt, no wheeze Abdomen - wound dressing clean, ostomy in place Extremities - 2+ edema Skin - no rashes Neuro - RASS -1, follows commands   Resolved Hospital Problem list   Metabolic acidosis with lactic acidosis   Assessment & Plan:   Septic shock from peritonitis 2nd to perforated sigmoid diverticulitis. - goal MAP > 65, SBP > 90 - KVO IV fluids - continue ABx  Acute hypoxic, hypercapnic respiratory failure in setting of sepsis. - extubation trial 7/29 - goal SpO2 > 92% - f/u CXR intermittently  S/p partial colectomy with colostomy. - post op care, nutrition per CCS  AKI from ATN in setting of sepsis. - baseline creatinine 1.34 from 09/28/19 - lasix 40 mg IV q6h x 2 doses on 7/29 - f/u BMET - no indication for renal replacement therapy at this time  Acute metabolic encephalopathy from sepsis. - monitor mental status after extubation  Hx of A fib present on admission. Chronic diastolic CHF. - resume anticoagulation when okay with surgery  Hx of hypothyroidism. - continue synthroid IV  Anemia of critical illness. - f/u CBC - transfuse for Hb < 7 or significant bleeding  Dusky toes. - likely from being on pressors - monitor for now  Goals of care. - palliative care met with family 7/28  Best practice:  Diet: NPO DVT prophylaxis: SCD GI prophylaxis: PPI Mobility: BR Code Status: limited resuscitation >> no CPR Disposition: ICU care   Labs    CMP Latest Ref Rng & Units 10/11/2019 10/10/2019 10/10/2019  Glucose 70 - 99 mg/dL 125(H) 137(H) -  BUN 8 - 23 mg/dL 64(H) 45(H) -  Creatinine 0.44 - 1.00 mg/dL 3.45(H) 2.98(H) -  Sodium 135 - 145 mmol/L 138 136 133(L)  Potassium 3.5 - 5.1 mmol/L 4.7 4.3 4.3  Chloride 98 - 111 mmol/L 101 99 -  CO2 22 - 32 mmol/L 22 20(L) -  Calcium 8.9 - 10.3 mg/dL 7.3(L) 7.6(L) -  Total Protein 6.5 - 8.1 g/dL - 4.5(L) -  Total Bilirubin 0.3 - 1.2 mg/dL  - 1.7(H) -  Alkaline Phos 38 - 126 U/L - 71 -  AST 15 - 41 U/L - 266(H) -  ALT 0 - 44 U/L - 91(H) -   CBC Latest Ref Rng & Units 10/11/2019 10/10/2019 10/10/2019  WBC 4.0 - 10.5 K/uL 22.1(H) 19.4(H) -  Hemoglobin 12.0 - 15.0 g/dL 8.7(L) 9.7(L) 9.9(L)  Hematocrit 36 - 46 % 26.8(L) 30.5(L) 29.0(L)  Platelets 150 - 400 K/uL 182 219 -    ABG    Component Value Date/Time   PHART 7.392 10/10/2019 0331   PCO2ART 36.9 10/10/2019 0331   PO2ART 91 10/10/2019 0331   HCO3 22.3 10/10/2019 0331   TCO2 23 10/10/2019 0331   ACIDBASEDEF 2.0 10/10/2019 0331   O2SAT 97.0 10/10/2019 0331    CBG (last 3)  Recent Labs    10/10/19 2337 10/11/19 0334 10/11/19 0824  GLUCAP 134* 130* 100*    Critical care time: 34 minutes  Chesley Mires, MD [facility] Pager - [phone] 10/11/2019, 8:56 AM

## 2019-10-11 NOTE — Progress Notes (Signed)
Daily Progress Note   Patient Name: Katie [name]       Date: 10/11/2019 DOB: 02-09-1941  Age: 79 y.o. MRN#: [id number] Attending Physician: Katie Mires, MD Primary Care Physician: Katie Pinto, MD Admit Date: 09/22/2019  Reason for Consultation/Follow-up:  To discuss complex medical decision making related to patient's goals of care  Discussed with Katie [name] Palliative NP. Chart reviewed.  Creatinine trending up.  WBC trending up.  Urine out put low. Patient is now extubated. Discussed with Dr. Halford [name]  Subjective: Patient is encephalopathic.  She yells out when I speak.  Her eyes are open but she does not appear to see me.  She attempts to batt me away when I briefly examine her.  Daughter Katie [name] is at bedside.  We talked privately in a conference room.  Katie [name] is well informed about her mothers kidney function.  She is concerned that she does not appear to be making much urine.  We talked about what happens if her mother declines again.  Would she want her reintubated? Katie [name] described a difficult last two years for her mother.  She also mentioned that her mother would never want to return to a nursing facility.    Katie [name] added [place] on by phone.  We discuss the highlights of our conversation.  After careful consideration Katie [name] suggests that DNR may be appropriate.  Katie [name] adds [place] on by phone.  Katie [name] feels strongly that if Katie [name] is dying then we should not interfere. (Its in God's hands).  However if she has curable illness we should aggressively resuscitate in order to treat her.  All of this makes good sense.    The family would like give Katie [name] another day or two to see how she's going to do before making any decisions regarding medical care.  Provided space and opportunity to Husband (who  has TBI) to be heard and understood.  Will meet again on 7/30 with family at 12:00     Assessment: Patient extubated, encephalopathic.  Unable to attempt ADLs or answer questions.  Creatinine slowly climbing. Placed on clears with hopes to advance diet.   Patient Profile/HPI:  79 y.o.femalewith past medical history of diastolic CHF, COPD on HS oxygen, atrial fibrillation, AICD, PVD, pneumonia, osteopenia, hypothyroidism, HTN, HLD, depression, AAAadmitted on 7/24/2021after AKI found by PCP. Recent hospitalization for GI  bleeding and diverticulitis, started on antibiotics. This admission, patient found to have perforated sigmoid diverticulitis treated conservatively with percutaneous drain placed 7/25 and antibiotics. Clinical worsening on 7/27, taken to OR and found ruptured sigmoid colon and stool filled abscess in the pelves s/p sigmoid colectomy with colostomy, washout and pelvic drain placement. Returned to ICU mottled on pressors and mechanical ventilation. 7/28 worsening creatinine 2.98 with poor urine output. Lactic acid trending down, 2.6. Palliative medicine consultation for goals of care.  Length of Stay: 5   Vital Signs: BP (!) 140/61    Pulse 70    Temp (!) 97.4 F (36.3 C) (Axillary)    Resp 14    Ht 5\' 3"  (1.6 m)    Wt 84.6 kg    LMP  (LMP Unknown)    SpO2 98%    BMI 33.04 kg/m  SpO2: SpO2: 98 % O2 Device: O2 Device: Ventilator O2 Flow Rate: O2 Flow Rate (L/min): 1 L/min       Palliative Assessment/Data: 20%     Palliative Care Plan    Recommendations/Plan: PMT will continue to follow with you to support family in their decision making process.  Code Status:  Limited code (No Chest Compressions)  Prognosis:  Unable to determine  At high risk for acute decline or death given recent diverticular rupture, acute on chronic renal failure.  Need improvement in kidney function, encephalopathy to clear and the ability to eat and drink.   Discharge Planning: To Be  Determined Family adamantly opposed to SNF.  Fortunately there is an Therapist, sports in the family.  Care plan was discussed with family, RN, Attending MD.  Thank you for allowing the Palliative Medicine Team to assist in the care of this patient.  Total time spent:  45 min     Greater than 50%  of this time was spent counseling and coordinating care related to the above assessment and plan.  Katie Jenny, PA-C Palliative Medicine  Please contact Palliative MedicineTeam phone at [phone] for questions and concerns between 7 am - 7 pm.   Please see AMION for individual provider pager numbers.

## 2019-10-11 NOTE — Procedures (Signed)
Extubation Procedure Note  Patient Details:   Name: Katie [name] DOB: Sep 23, 1940 MRN: [id number]   Airway Documentation:    Vent end date: 10/11/19 Vent end time: 0953   Evaluation  O2 sats: stable throughout Complications: No apparent complications Patient did tolerate procedure well. Bilateral Breath Sounds: Clear, Diminished   No   Pt was extubated per MD order to 4L [place]. Pt tolerating well at this time with saturations of 97%. Pt was suctioned and had a positive cuff leak prior to extubation. Pt unable to speak or follow commands afterwards but does move extremities at random, MD ware. No stridor heard at this time. RT will continue to monitor.   Eliora Nienhuis A Raeshaun Simson 10/11/2019, 9:57 AM

## 2019-10-11 NOTE — Progress Notes (Signed)
Nutrition Follow-up  DOCUMENTATION CODES:   Not applicable  INTERVENTION:   - RD will follow for diet advancement and supplement as appropriate  If unable to advance diet, recommend placement of Cortrak NG tube and initiation of enteral nutrition. Recommend: - Vital 1.5 @ 50 ml/hr (1200 ml/day) - ProSource TF 90 ml BID  Recommended tube feeding regimen would provide 1960 kcal, 125 grams of protein, and 917 ml of H2O (meets 100% of needs).  NUTRITION DIAGNOSIS:   Increased nutrient needs related to acute illness as evidenced by estimated needs.  Ongoing  GOAL:   Patient will meet greater than or equal to 90% of their needs  Unmet at this time  MONITOR:   Diet advancement, Labs, Weight trends, Skin, I & O's  REASON FOR ASSESSMENT:   Ventilator    ASSESSMENT:   79 yo female admitted with perforated diverticulitis. S/P exploratory laparotomy, sigmoid colectomy with colostomy, and drainage of pelvic abscess on 7/27. PMH includes A fib, osteopenia, HTN, CHF, hypothyroidism, HLD, PVD, COPD.  Discussed pt with RN and during ICU rounds. Pt now with stool in ostomy pouch. Per CCS, okay to start trickle tube feeds. However, pt extubated today. Pt is confused and lethargic and remains NPO. RD will monitor for diet advancement and supplement as appropriate. If pt remains altered and unable to advance diet, recommend considering Cortrak NG tube for enteral nutrition.  Pt remains on Levophed for blood pressure support. Vasopressin off this AM.  EDW: 80.7 kg (admission weight). Pt continues to have moderate pitting edema to BUE and BLE.  Medications reviewed and include: IV Lasix 40 mg x 2 doses, protonix, IV abx, Levophed IVF: LR @ 10 ml/hr  Labs reviewed: BUN 64, creatinine 3.45, hemoglobin 8.7, phosphorus 7.1 on 7/28, elevated LFTs on 7/28 CBG's: 100-143 x 24 hours  UOP: 39 ml x 24 hours JP drain: 60 ml x 24 hours Colostomy: 70 ml x 24 hours I/O's: +8.2 L since  admit  NUTRITION - FOCUSED PHYSICAL EXAM:    Most Recent Value  Orbital Region No depletion  Upper Arm Region No depletion  Thoracic and Lumbar Region No depletion  Buccal Region Unable to assess  Temple Region No depletion  Clavicle Bone Region Mild depletion  Clavicle and Acromion Bone Region Mild depletion  Scapular Bone Region Unable to assess  Dorsal Hand No depletion  Patellar Region No depletion  Anterior Thigh Region No depletion  Posterior Calf Region No depletion  Edema (RD Assessment) Moderate  [BUE, BLE]  Hair Reviewed  Eyes Reviewed  Mouth Reviewed  Skin Reviewed  Nails Reviewed       Diet Order:   Diet Order            Diet NPO time specified  Diet effective now                 EDUCATION NEEDS:   Not appropriate for education at this time  Skin:  Skin Assessment: Skin Integrity Issues: Incisions: abdomen, bilateral feet  Last BM:  78/29/21 colostomy  Height:   Ht Readings from Last 1 Encounters:  09/20/2019 5\' 3"  (1.6 m)    Weight:   Wt Readings from Last 1 Encounters:  10/10/2019 84.6 kg    Ideal Body Weight:  52.3 kg  BMI:  Body mass index is 33.04 kg/m.  Estimated Nutritional Needs:   Kcal:  1800-2000  Protein:  115-130 gm  Fluid:  >/= 1.7 L    Gaynell Face, MS, RD, LDN  Inpatient Clinical Dietitian Please see AMiON for contact information.

## 2019-10-11 NOTE — Progress Notes (Signed)
Pt has been extubated. She is on 4L [place]. O2 Sats WDL. Sedation stopped. Will continue to monitor

## 2019-10-12 DIAGNOSIS — Z515 Encounter for palliative care: Secondary | ICD-10-CM

## 2019-10-12 LAB — BASIC METABOLIC PANEL
Anion gap: 15 (ref 5–15)
BUN: 76 mg/dL — ABNORMAL HIGH (ref 8–23)
CO2: 21 mmol/L — ABNORMAL LOW (ref 22–32)
Calcium: 7.4 mg/dL — ABNORMAL LOW (ref 8.9–10.3)
Chloride: 101 mmol/L (ref 98–111)
Creatinine, Ser: 3.73 mg/dL — ABNORMAL HIGH (ref 0.44–1.00)
GFR calc Af Amer: 13 mL/min — ABNORMAL LOW (ref >60)
GFR calc non Af Amer: 11 mL/min — ABNORMAL LOW (ref >60)
Glucose, Bld: 91 mg/dL (ref 70–99)
Potassium: 4.5 mmol/L (ref 3.5–5.1)
Sodium: 137 mmol/L (ref 135–145)

## 2019-10-12 LAB — GLUCOSE, CAPILLARY
Glucose-Capillary: 73 mg/dL (ref 70–99)
Glucose-Capillary: 76 mg/dL (ref 70–99)
Glucose-Capillary: 76 mg/dL (ref 70–99)
Glucose-Capillary: 65 mg/dL — ABNORMAL LOW (ref 70–99)
Glucose-Capillary: 75 mg/dL (ref 70–99)

## 2019-10-12 LAB — CBC
HCT: 28 % — ABNORMAL LOW (ref 36.0–46.0)
Hemoglobin: 8.9 g/dL — ABNORMAL LOW (ref 12.0–15.0)
MCH: 27.6 pg (ref 26.0–34.0)
MCHC: 31.8 g/dL (ref 30.0–36.0)
MCV: 86.7 fL (ref 80.0–100.0)
Platelets: 157 10*3/uL (ref 150–400)
RBC: 3.23 MIL/uL — ABNORMAL LOW (ref 3.87–5.11)
RDW: 18.6 % — ABNORMAL HIGH (ref 11.5–15.5)
WBC: 22.7 10*3/uL — ABNORMAL HIGH (ref 4.0–10.5)
nRBC: 1.9 % — ABNORMAL HIGH (ref 0.0–0.2)

## 2019-10-12 LAB — MAGNESIUM: Magnesium: 2.4 mg/dL (ref 1.7–2.4)

## 2019-10-12 LAB — AEROBIC/ANAEROBIC CULTURE W GRAM STAIN (SURGICAL/DEEP WOUND)

## 2019-10-12 MED ORDER — FLUCONAZOLE IN SODIUM CHLORIDE 400-0.9 MG/200ML-% IV SOLN
400.0000 mg | Freq: Once | INTRAVENOUS | Status: AC
  Administered 2019-10-12: 400 mg via INTRAVENOUS
  Filled 2019-10-12: qty 200

## 2019-10-12 MED ORDER — ACETAMINOPHEN 10 MG/ML IV SOLN
1000.0000 mg | Freq: Four times a day (QID) | INTRAVENOUS | Status: AC
  Administered 2019-10-12 – 2019-10-13 (×4): 1000 mg via INTRAVENOUS
  Filled 2019-10-12 (×6): qty 100

## 2019-10-12 MED ORDER — BOOST / RESOURCE BREEZE PO LIQD CUSTOM
1.0000 | Freq: Three times a day (TID) | ORAL | Status: DC
  Administered 2019-10-12 (×2): 1 via ORAL

## 2019-10-12 MED ORDER — OSMOLITE 1.2 CAL PO LIQD
1000.0000 mL | ORAL | Status: DC
  Filled 2019-10-12: qty 1000

## 2019-10-12 MED ORDER — FLUCONAZOLE IN SODIUM CHLORIDE 200-0.9 MG/100ML-% IV SOLN
200.0000 mg | INTRAVENOUS | Status: DC
  Administered 2019-10-13: 200 mg via INTRAVENOUS
  Filled 2019-10-12: qty 100

## 2019-10-12 MED ORDER — METHOCARBAMOL 1000 MG/10ML IJ SOLN
500.0000 mg | Freq: Four times a day (QID) | INTRAVENOUS | Status: DC | PRN
  Filled 2019-10-12: qty 5

## 2019-10-12 NOTE — Evaluation (Signed)
Occupational Therapy Evaluation Patient Details Name: Katie [name] MRN: [id number] DOB: Nov 02, 1940 Today's Date: 10/12/2019    History of Present Illness Patient is 79 y/o female admitted with perforated sigmoid diverticulitis conservative treatment with percutaneous drain 7/25 and evolving shock, 7/27 found ruptured sigmoid colon, colectomy with colostomy and stool filled abscess in the pelvis. extubated 7/29 PMH includes migraines, chronic systolic CHF, ICD, COPD, chronic pain, peripheral neuropathy, PVD, A-fib, CKD, CHF, COPD, CVA, CAD, pulmonary fibrosis and aortic aneurysm.   Clinical Impression   Patient is s/p ruptured sigmoid colon surgery resulting in functional limitations due to the deficits listed below (see OT problem list). Pt requires total (A) total+2 for all mobility and adls. Pt with noticeable edema in all extremities and abdomen. Pt aroused and needs redirection at time.  Patient will benefit from skilled OT acutely to increase independence and safety with ADLS to allow discharge SNF. Palliative present with family to observe session prior to meeting.     Follow Up Recommendations  SNF (palliative present during sesion)    Equipment Recommendations  Wheelchair (measurements OT);Hospital bed;Wheelchair cushion (measurements OT);Other (comment) (hoyer lift)    Recommendations for Other Services       Precautions / Restrictions Precautions Precautions: Fall Precaution Comments: jp drain abdomen, ij right neck, ostomy left abdomen      Mobility Bed Mobility Overal bed mobility: Needs Assistance Bed Mobility: Supine to Sit;Sit to Supine     Supine to sit: Total assist;+2 for physical assistance;+2 for safety/equipment Sit to supine: Total assist;+2 for physical assistance;+2 for safety/equipment   General bed mobility comments: total dependence on staff. pt does move arms in response to staff moving her  Transfers                 General transfer  comment: not appropriate at this time    Balance Overall balance assessment: Needs assistance Sitting-balance support: Bilateral upper extremity supported;Feet supported Sitting balance-Leahy Scale: Poor Sitting balance - Comments: pt pushing with L UE and pulling with R UE once upright but unable to sustain sitting without total  to MOd (A).  Postural control: Posterior lean                                 ADL either performed or assessed with clinical judgement   ADL Overall ADL's : Needs assistance/impaired                                       General ADL Comments: total for all     Vision   Vision Assessment?: Yes Eye Alignment: Impaired (comment) Additional Comments: pt with blank stare. pt with lateral eye movements a few times during session. granddaughter asked pt how many fingers and answers 2 correctly with central vision. difficult to complete visual assessment due to cognition     Perception     Praxis      Pertinent Vitals/Pain Pain Assessment: No/denies pain     Hand Dominance Right   Extremity/Trunk Assessment Upper Extremity Assessment Upper Extremity Assessment: RUE deficits/detail;LUE deficits/detail;Difficult to assess due to impaired cognition;Generalized weakness RUE Deficits / Details: edema present gross 4 out 5 grasp flexion shoulder 40 degrees LUE Deficits / Details: edema present   Lower Extremity Assessment Lower Extremity Assessment: Generalized weakness (Very edematous and weepy proximally)   Cervical / Trunk Assessment Cervical /  Trunk Assessment: Kyphotic   Communication Communication Communication: No difficulties   Cognition Arousal/Alertness: Awake/alert Behavior During Therapy: Restless;Flat affect Overall Cognitive Status: Impaired/Different from baseline Area of Impairment: Orientation;Attention;Memory;Safety/judgement;Awareness;Following commands;Problem solving                  Orientation Level: Disoriented to;Time;Situation Current Attention Level: Focused Memory: Decreased recall of precautions;Decreased short-term memory Following Commands: Follows one step commands inconsistently;Follows one step commands with increased time Safety/Judgement: Decreased awareness of safety;Decreased awareness of deficits Awareness: Intellectual Problem Solving: Slow processing;Decreased initiation;Difficulty sequencing General Comments: repeating things even after new question asked, pt answering Stipp to new questions,    General Comments  noted to have drainage from JP dressing, pt with weeping noted in belly area with edema present in abdomen and in all extremities.  Pt's family present during the session.    Exercises Exercises: Other exercises   Shoulder Instructions      Home Living Family/patient expects to be discharged to:: Hospice/Palliative care                                        Prior Functioning/Environment Level of Independence: Independent                 OT Problem List: Obesity;Decreased strength;Decreased range of motion;Decreased activity tolerance;Impaired balance (sitting and/or standing);Decreased cognition;Decreased coordination;Decreased safety awareness;Decreased knowledge of use of DME or AE;Cardiopulmonary status limiting activity;Increased edema;Impaired UE functional use      OT Treatment/Interventions: Self-care/ADL training;DME and/or AE instruction;Balance training;Patient/family education;Visual/perceptual remediation/compensation;Cognitive remediation/compensation;Therapeutic activities;Therapeutic exercise;Neuromuscular education;Energy conservation;Manual therapy;Modalities    OT Goals(Current goals can be found in the care plan section) Acute Rehab OT Goals Patient Stated Goal: none stated OT Goal Formulation: Patient unable to participate in goal setting Time For Goal Achievement: 10/26/19 Potential to  Achieve Goals: Good  OT Frequency: Min 2X/week   Barriers to D/C:            Co-evaluation PT/OT/SLP Co-Evaluation/Treatment: Yes Reason for Co-Treatment: For patient/therapist safety PT goals addressed during session: Mobility/safety with mobility OT goals addressed during session: ADL's and self-care      AM-PAC OT "6 Clicks" Daily Activity     Outcome Measure Help from another person eating meals?: Total Help from another person taking care of personal grooming?: Total Help from another person toileting, which includes using toliet, bedpan, or urinal?: Total Help from another person bathing (including washing, rinsing, drying)?: Total Help from another person to put on and taking off regular upper body clothing?: Total Help from another person to put on and taking off regular lower body clothing?: Total 6 Click Score: 6   End of Session Equipment Utilized During Treatment: Oxygen Nurse Communication: Mobility status;Need for lift equipment;Precautions  Activity Tolerance: Patient tolerated treatment well Patient left: in bed;with call bell/phone within reach;with bed alarm set;with family/visitor present;with SCD's reapplied  OT Visit Diagnosis: Muscle weakness (generalized) (M62.81)                Time: 1610-9604 OT Time Calculation (min): 23 min Charges:  OT General Charges $OT Visit: 1 Visit OT Evaluation $OT Eval Moderate Complexity: 1 Mod   Brynn, OTR/L  Acute Rehabilitation Services Pager: [phone] Office: [phone] .   Jeri Modena 10/12/2019, 2:11 PM

## 2019-10-12 NOTE — Progress Notes (Signed)
3 Days Post-Op   Subjective/Chief Complaint: Extubated yesterday afternoon, off of sedation, off of pressor support  Urine output remains low, was given lasix yesterday, kidney function slightly worse. Saying "help me, my legs hurt" Also c/o "tummy" pain.   Objective: Vital signs in last 24 hours: Temp:  [95.9 F (35.5 C)-97.9 F (36.6 C)] 95.9 F (35.5 C) (07/30 0414) Pulse Rate:  [70-73] 70 (07/30 0500) Resp:  [8-17] 12 (07/30 0600) BP: (98-127)/(54-77) 127/67 (07/30 0600) SpO2:  [91 %-100 %] 100 % (07/30 0500) Arterial Line BP: (74-138)/(41-67) 130/62 (07/30 0500) FiO2 (%):  [40 %] 40 % (07/29 0800) Weight:  [104.3 kg] 104.3 kg (07/30 0500) Last BM Date: 10/11/19  Intake/Output from previous day: 07/29 0701 - 07/30 0700 In: 997.1 [I.V.:631.8; IV Piggyback:365.3] Out: 468 [Urine:108; Emesis/NG output:200; Drains:160] Intake/Output this shift: No intake/output data recorded.  Exam: Gen: extubated, on [place], somnolent  HEENT: Eyes open CV: RRR, pitting edema BLE R>L, toes appear less dusky compared to prior exam Abdomen obese, soft, JP serosang (160 cc/24h) Ostomy viable, soft stool in bag, dressing dry GU: foley in place, UOP 108 cc/24h  Neuro: arouses to voice, moves all extremities spontaneously, following  Commands (opened her mouth for me today), oriented to person and place ([place])   Lab Results:  Recent Labs    10/11/19 0418 10/12/19 0339  WBC 22.1* 22.7*  HGB 8.7* 8.9*  HCT 26.8* 28.0*  PLT 182 157   BMET Recent Labs    10/11/19 0418 10/12/19 0339  NA 138 137  K 4.7 4.5  CL 101 101  CO2 22 21*  GLUCOSE 125* 91  BUN 64* 76*  CREATININE 3.45* 3.73*  CALCIUM 7.3* 7.4*   PT/INR Recent Labs    09/22/2019 0810 10/10/19 0353  LABPROT 52.8* 22.5*  INR 6.1* 2.1*   ABG Recent Labs    09/13/2019 1308 10/10/19 0331  PHART 7.015* 7.392  HCO3 14.9* 22.3    Studies/Results: DG Chest Port 1 View  Result Date: 10/11/2019 CLINICAL DATA:   Respiratory failure. EXAM: PORTABLE CHEST 1 VIEW COMPARISON:  10/08/2019. FINDINGS: Endotracheal tube has been slightly withdrawn, its tip is 1.5 cm above the carina. Interim placement of NG tube, tip below left hemidiaphragm. Right IJ line stable position. AICD in stable position. Stable cardiomegaly. Bibasilar pulmonary infiltrates/edema noted. Left lower lobe atelectatic changes present. No prominent pleural effusion or pneumothorax. IMPRESSION: 1. Endotracheal tube is been slightly withdrawn, its tip is 1.5 cm above the carina. Interim placement of NG tube, tip below left hemidiaphragm. Right IJ line stable position. 2.  AICD in stable position.  Stable cardiomegaly. 3. Bibasilar pulmonary infiltrates/edema noted. Left lower lobe atelectatic changes present. Electronically Signed   By: Marcello Moores  Register   On: 10/11/2019 06:08    Anti-infectives: Anti-infectives (From admission, onward)   Start     Dose/Rate Route Frequency Ordered Stop   10/11/19 0400  vancomycin (VANCOCIN) IVPB 1000 mg/200 mL premix  Status:  Discontinued        1,000 mg 200 mL/hr over 60 Minutes Intravenous Every 48 hours 10/04/2019 0535 10/10/19 0925   10/10/19 0600  ceFEPIme (MAXIPIME) 2 g in sodium chloride 0.9 % 100 mL IVPB     Discontinue     2 g 200 mL/hr over 30 Minutes Intravenous Every 24 hours 09/19/2019 0535     09/22/2019 0600  metroNIDAZOLE (FLAGYL) IVPB 500 mg     Discontinue     500 mg 100 mL/hr over 60 Minutes Intravenous  Every 8 hours 09/30/2019 0516     09/23/2019 0530  ceFEPIme (MAXIPIME) 2 g in sodium chloride 0.9 % 100 mL IVPB        2 g 200 mL/hr over 30 Minutes Intravenous  Once 09/18/2019 0516 09/22/2019 0713   09/25/2019 0530  vancomycin (VANCOREADY) IVPB 1500 mg/300 mL        1,500 mg 150 mL/hr over 120 Minutes Intravenous  Once 09/29/2019 0516 10/12/2019 0931   10/07/19 1400  piperacillin-tazobactam (ZOSYN) IVPB 2.25 g  Status:  Discontinued        2.25 g 100 mL/hr over 30 Minutes Intravenous Every 8 hours 10/07/19  1137 10/03/2019 0516   10/08/2019 2230  piperacillin-tazobactam (ZOSYN) IVPB 3.375 g  Status:  Discontinued        3.375 g 100 mL/hr over 30 Minutes Intravenous Every 8 hours 09/30/2019 2212 10/07/19 1137   10/11/2019 1900  piperacillin-tazobactam (ZOSYN) IVPB 3.375 g  Status:  Discontinued        3.375 g 100 mL/hr over 30 Minutes Intravenous  Once 09/30/2019 1846 10/05/2019 2218      Assessment/Plan: Acute hypoxic respiratory failure AKI - BUN 76, Cr 6.55 Metabolic acidosis - improving afib on xarelto at home, xarelto held Chronic diastolic HF HTN HLD Hypothyroidism Depression   Septic shock 2/2 perforated diverticulitis  s/p Procedure(s): EXPLORATORY LAPAROTOMY; SIGMOID COLECTOMY WITH COLOSTOMY (N/A) DRAIN REMOVAL (Left) DRAINAGE PELVIC ABSCESS (N/A) -POD#3, afebrile, WBC 22.7, hgb 8.9 from 8.7 stable - improving, off of pressor support, remains extubated, following commands  - continue IV abx - I would recommend cortrak placement to start enteral nutrition before the weekend as well as SLP swallow eval if patients mental status continues to improve. Appreciate nutrition making TF recs. - BID wet-to-dry dressing changes  - continue JP - for persistent leukocytosis may need repeat CT scan of the abdomen in the next couple of days.   FEN: NPO, IVF, recommend placing cortrak to start TF, SLP eval ID: Zosyn 7/24-26, cefepime 7/26 >>, flagyl 7/26 >>, Vanc 7/27 >>  VTE: PAS Foley: In place, low UOP, diuresed yesterday  Dispo: ICU Code status: limited code, NO CPR, continue other measures.   LOS: 6 days    Jill Alexanders MD 10/12/2019

## 2019-10-12 NOTE — Progress Notes (Signed)
Daily Progress Note   Patient Name: Katie [name]       Date: 10/11/2019 DOB: 01/07/1941  Age: 79 y.o. MRN#: [id number] Attending Physician: Chesley Mires, MD Primary Care Physician: [name], MD Admit Date: 09/18/2019  Reason for Consultation/Follow-up:  To discuss complex medical decision making related to patient's goals of care  Discussed with Ihor Dow Palliative NP. Chart reviewed.  Creatinine trending up.  WBC trending up.  Urine out put low. Patient is now extubated. Discussed with Dr. Halford Chessman  Subjective: Patient is encephalopathic.  She yells out when I speak.  Her eyes are open but she does not appear to see me.  She attempts to batt me away when I briefly examine her.  Daughter [name] is at bedside.  We talked privately in a conference room.  [name] is well informed about her mothers kidney function.  She is concerned that she does not appear to be making much urine.  We talked about what happens if her mother declines again.  Would she want her reintubated? [name] described a difficult last two years for her mother.  She also mentioned that her mother would never want to return to a nursing facility.  It struck me that [name] will have to live at least short tern in a nursing home if not long term.   So I asked [name] - what are we doing? Your mother will have to live in a nursing home.  [name] added on her niece (patient's grand daughter) who is a [employer] representative.  We discussed code status.  Caryl Pina the RN gave it strong consideration and then suggested to [place] that [name] should be DNR.  They wanted to think about it further.  [name] then added on patient's husband, Marden Noble.  Doug felt that if [name] was going to die from infection or some other cause then she should not be re-intubated, but if it  was not clear that she was dying then she should be re-intubated.  Doug asked to meet with me on Friday and Saturday at 12:00.  I agreed.  [name] will be present as well.  The net result of our conversations was a decision to give it another day or two and re-discuss code status as well as disposition.     Assessment: Patient extubated, but encephalopathic with poor kidney function.  Patient Profile/HPI:  79 y.o. female  with past medical history of diastolic CHF, COPD on HS oxygen, atrial fibrillation, AICD, PVD, pneumonia, osteopenia, hypothyroidism, HTN, HLD, depression, AAA  admitted on 09/27/2019 after AKI found by PCP. Recent hospitalization for GI bleeding and diverticulitis, started on antibiotics. This admission, patient found to have perforated sigmoid diverticulitis treated conservatively with percutaneous drain placed 7/25 and antibiotics. Clinical worsening on 7/27, taken to OR and found ruptured sigmoid colon and stool filled abscess in the pelves s/p sigmoid colectomy with colostomy, washout and pelvic drain placement. Returned to ICU mottled on pressors and mechanical ventilation. 7/28 worsening creatinine 2.98 with poor urine output. Lactic acid trending down, 2.6. Palliative medicine consultation for goals of care.     Length of Stay: 5   Vital Signs: BP (!) 140/61   Pulse 70   Temp (!) 97.4 F (36.3 C) (Axillary)   Resp 14   Ht 5\' 3"  (1.6 m)   Wt 84.6 kg   LMP  (LMP Unknown)   SpO2 98%   BMI 33.04 kg/m  SpO2: SpO2: 98 % O2 Device: O2 Device: Ventilator O2 Flow Rate: O2 Flow Rate (L/min): 1 L/min       Palliative Assessment/Data: 20%     Palliative Care Plan    Recommendations/Plan: PMT will continue to follow with you.  Family has requested meetings on Friday and Saturday at 12:00.  Husband has TBI, but daughter diligently involves him in decision making.  Code Status:  Limited code  Prognosis:  Unable to determine.  At high risk for decline or  death.  Kidney function tenuous.   Discharge Planning: To Be Determined  Care plan was discussed with family, RN, attending PCCM MD.  Thank you for allowing the Palliative Medicine Team to assist in the care of this patient.  Total time spent:  45 min.     Greater than 50%  of this time was spent counseling and coordinating care related to the above assessment and plan.  Florentina Jenny, PA-C Palliative Medicine  Please contact Palliative MedicineTeam phone at [phone] for questions and concerns between 7 am - 7 pm.   Please see AMION for individual provider pager numbers.

## 2019-10-12 NOTE — Evaluation (Signed)
Clinical/Bedside Swallow Evaluation Patient Details  Name: Katie [name] MRN: [id number] Date of Birth: December 08, 1940  Today's Date: 10/12/2019 Time: SLP Start Time (ACUTE ONLY): 2482 SLP Stop Time (ACUTE ONLY): 1050 SLP Time Calculation (min) (ACUTE ONLY): 15 min  Past Medical History:  Past Medical History:  Diagnosis Date  . AAA (abdominal aortic aneurysm) ([place])   . AICD (automatic cardioverter/defibrillator) present 11/10/2017  . Arthritis    "some in my knees" (11/10/2017)  . Atrial fibrillation ([place])   . CHF (congestive heart failure) ([place])   . Chronic bronchitis ([place])   . COPD (chronic obstructive pulmonary disease) ([place])   . Depression   . GERD (gastroesophageal reflux disease)   . Gout    "daily RX" (11/10/2017)  . History of hiatal hernia   . Hyperlipidemia   . Hypertension   . Hypothyroid   . Migraine headache    "hx; none since 1980s" (11/10/2017)  . Osteopenia   . Pneumonia    "~ 3 times" (11/10/2017)  . PVD (peripheral vascular disease) ([place])    Past Surgical History:  Past Surgical History:  Procedure Laterality Date  . ABDOMINAL AORTIC ENDOVASCULAR STENT GRAFT N/A 04/11/2013   Procedure: ABDOMINAL AORTIC ENDOVASCULAR STENT GRAFT WITH RIGHT FEMORAL PATCH ANGIOPLASTY;  Surgeon: Mal Misty, MD;  Location: [facility];  Service: Vascular;  Laterality: N/A;  . AV NODE ABLATION N/A 09/13/2018   Procedure: AV NODE ABLATION;  Surgeon: Evans Lance, MD;  Location: [facility];  Service: Cardiovascular;  Laterality: N/A;  . BIV ICD INSERTION CRT-D  11/10/2017  . BIV ICD INSERTION CRT-D N/A 11/10/2017   Procedure: BIV ICD INSERTION CRT-D;  Surgeon: Evans Lance, MD;  Location: [facility];  Service: Cardiovascular;  Laterality: N/A;  . BREAST SURGERY     LEFT BREAST BIOPSY  . CARDIAC CATHETERIZATION    . CATARACT EXTRACTION W/ INTRAOCULAR LENS  IMPLANT, BILATERAL Bilateral   . CHOLECYSTECTOMY OPEN  1972  . COLONOSCOPY    . DRAIN REMOVAL Left 09/22/2019    Procedure: DRAIN REMOVAL;  Surgeon: Coralie Keens, MD;  Location: [facility];  Service: General;  Laterality: Left;  . ESOPHAGEAL BRUSHING  09/29/2019   Procedure: ESOPHAGEAL BRUSHING;  Surgeon: Rogene Houston, MD;  [facility];  Service: Endoscopy;;  for candida   . ESOPHAGOGASTRODUODENOSCOPY N/A 09/29/2019   Procedure: ESOPHAGOGASTRODUODENOSCOPY (EGD);  Surgeon: Rogene Houston, MD;  [facility];  Service: Endoscopy;  Laterality: N/A;  . EYE SURGERY Bilateral    "bleeding in my eyes"  . FRACTURE SURGERY    . INCISION AND DRAINAGE ABSCESS N/A 09/28/2019   Procedure: DRAINAGE PELVIC ABSCESS;  Surgeon: Coralie Keens, MD;  Location: [facility];  Service: General;  Laterality: N/A;  . LAPAROTOMY N/A 09/22/2019   Procedure: EXPLORATORY LAPAROTOMY; SIGMOID COLECTOMY WITH COLOSTOMY;  Surgeon: Coralie Keens, MD;  Location: [facility];  Service: General;  Laterality: N/A;  . TIBIA FRACTURE SURGERY Left 1970s  . TONSILLECTOMY    . VARICOSE VEIN SURGERY Bilateral    "laser"   HPI:  79 year old female admitted 7/24 w/ perf sigmoid diverticulitis.Treated conservatively w/ percutaneous drain (placed 7/25) and abx. Failed conservative RX w/ evolving shock and coagulopathy. In OR found ruptured sigmoid colon and stool filled abscess in the pelvis. She had sigmoid colectomy w/ colostomy, washout and pelvic drain placed. Returned to ICU mottled on pressors and mechanical ventilator. ETT 7/27-7/29. Pt has had two prior swallowing assessments, notable for a primary esophageal  dysphagia with globus and early satiety but normal oropharyngeal swallow.    Assessment / Plan / Recommendation Clinical Impression  Pt alert but quite confused, perseverates on answers to orientation questions, able to participate in automatic behaviors necessary for swallowing.  There was active attention/manipulation of POs, the appearance of a brisk swallow response, and no s/s of aspiration with water or applesauce.   Recommend beginning a PO diet (clear liquids) - advance per surgery.  She does not appear to have any oropharyngeal or post-intubation issues that would prevent standard diet advancement.  Our service will sign off.  SLP Visit Diagnosis: Dysphagia, unspecified (R13.10)    Aspiration Risk  No limitations    Diet Recommendation   clear liquids and advance per MD  Medication Administration: Whole meds with puree    Other  Recommendations Oral Care Recommendations: Oral care BID   Follow up Recommendations None      Frequency and Duration            Prognosis Prognosis for Safe Diet Advancement: Good      Swallow Study   General Date of Onset: 10/12/2019 HPI: 79 year old female admitted 7/24 w/ perf sigmoid diverticulitis.Treated conservatively w/ percutaneous drain (placed 7/25) and abx. Failed conservative RX w/ evolving shock and coagulopathy. In OR found ruptured sigmoid colon and stool filled abscess in the pelvis. She had sigmoid colectomy w/ colostomy, washout and pelvic drain placed. Returned to ICU mottled on pressors and mechanical ventilator. ETT 7/27-7/29. Pt has had two prior swallowing assessments, notable for a primary esophageal dysphagia with globus and early satiety but normal oropharyngeal swallow.  Type of Study: Bedside Swallow Evaluation Previous Swallow Assessment: see HPI Diet Prior to this Study: NPO Temperature Spikes Noted: No Respiratory Status: Nasal cannula History of Recent Intubation: Yes Length of Intubations (days): 2 days Date extubated: 10/11/19 Behavior/Cognition: Alert;Confused Oral Cavity Assessment: Within Functional Limits Oral Care Completed by SLP: No Oral Cavity - Dentition: Edentulous Self-Feeding Abilities: Needs assist Patient Positioning: Upright in bed Baseline Vocal Quality: Normal Volitional Cough: Cognitively unable to elicit Volitional Swallow: Unable to elicit    Oral/Motor/Sensory Function Overall Oral Motor/Sensory  Function: Within functional limits   Ice Chips Ice chips: Within functional limits   Thin Liquid Thin Liquid: Within functional limits    Nectar Thick Nectar Thick Liquid: Not tested   Honey Thick Honey Thick Liquid: Not tested   Puree Puree: Within functional limits   Solid     Solid: Not tested      Juan Quam Laurice 10/12/2019,10:57 AM   Estill Bamberg L. Tivis Ringer, [place] Office number [phone] Pager [phone]

## 2019-10-12 NOTE — Progress Notes (Signed)
Pharmacy Antibiotic Note  Katie [name] is a 78 y.o. female admitted on 10/09/2019 with perforated diverticulum; pt became hypotensive overnight and WBC increased from 8.3 to 17.3 over one day.  Pharmacy has been consulted to broaden ABX to vancomycin and cefepime; MD also added Flagyl appropriately.  7/30 Per surgery continue IV antibiotics - for persistent leukocytosis may need repeat CT scan of the abdomen in the next couple of days.  Vanc d/c'd - continues on Cefepime and flagyl  Plan: Continue Cefepime 2g IV Q24H. Monitor clinical status  Height: 5\' 3"  (224 cm) Weight: (!) 104.3 kg (229 lb 15 oz) IBW/kg (Calculated) : 52.4  Temp (24hrs), Avg:97 F (36.1 C), Min:95.9 F (35.5 C), Max:97.9 F (36.6 C)  Recent Labs  Lab 09/19/2019 2231 10/07/19 0151 09/27/2019 0402 10/08/2019 0532 09/28/2019 0719 10/08/2019 1505 10/10/19 0353 10/10/19 0945 10/11/19 0418 10/12/19 0339  WBC  --    < > 17.3*  --   --  14.5* 19.4*  --  22.1* 22.7*  CREATININE  --    < > 2.65*  --   --  2.60* 2.98*  --  3.45* 3.73*  LATICACIDVEN 1.9  --   --  5.8* 5.4*  --  5.3* 2.6*  --   --    < > = values in this interval not displayed.    Estimated Creatinine Clearance: 14.6 mL/min (A) (by C-G formula based on SCr of 3.73 mg/dL (H)).    Allergies  Allergen Reactions   Amiodarone Other (See Comments)    PULMONARY TOXICITY   Diovan [Valsartan] Other (See Comments)    HYPOTENSION   Doxycycline Diarrhea and Other (See Comments)    VISUAL DISTURBANCE   Flexeril [Cyclobenzaprine] Other (See Comments)    FATIGUE   Keflex [Cephalexin] Diarrhea   Verapamil Other (See Comments)    EDEMA   Ciprofloxacin Diarrhea and Nausea And Vomiting   Augmentin [Amoxicillin-Pot Clavulanate] Diarrhea and Nausea And Vomiting    Tolerating piperacillin/tazobactam, previously tolerated ampicillin/sulbactam at prior visit   Codeine Hives    Thank you for allowing pharmacy to be a part of this patients care.  Alanda Slim, PharmD, [facility] Clinical Pharmacist Please see AMION for all Pharmacists' Contact Phone Numbers 10/12/2019, 10:20 AM

## 2019-10-12 NOTE — Progress Notes (Signed)
Nutrition Follow-up  DOCUMENTATION CODES:   Not applicable  INTERVENTION:    Boost Breeze po TID, each supplement provides 250 kcal and 9 grams of protein  NUTRITION DIAGNOSIS:   Increased nutrient needs related to acute illness as evidenced by estimated needs.  Ongoing  GOAL:   Patient will meet greater than or equal to 90% of their needs  Progressing  MONITOR:   Diet advancement, Labs, Weight trends, Skin, I & O's  ASSESSMENT:   79 yo female admitted with perforated diverticulitis. S/P exploratory laparotomy, sigmoid colectomy with colostomy, and drainage of pelvic abscess on 7/27. PMH includes A fib, osteopenia, HTN, CHF, hypothyroidism, HLD, PVD, COPD.  Discussed patient in ICU rounds and with RN today. S/P swallow evaluation with SLP today, diet advanced to clear liquids, with plans to advance as tolerated per Surgery team.  Received consult for Cortrak placement and TF management, however, decision was made to hold off on Cortrak and TF since diet was advanced.   EDW: 80.7 kg (admission weight). Pt continues to have moderate to deep pitting edema to BUE and BLE.  Labs and medications reviewed.  JP drain: 160 ml x 24 hours Colostomy: no colostomy output documented yesterday I/O's: +8.8 L since admit   Diet Order:   Diet Order            Diet clear liquid Room service appropriate? Yes; Fluid consistency: Thin  Diet effective now                 EDUCATION NEEDS:   Not appropriate for education at this time  Skin:  Skin Assessment: Skin Integrity Issues: Incisions: abdomen, bilateral feet  Last BM:  10/11/19 colostomy  Height:   Ht Readings from Last 1 Encounters:  10/10/2019 5\' 3"  (1.6 m)    Weight:   Wt Readings from Last 1 Encounters:  10/12/19 (!) 104.3 kg    Ideal Body Weight:  52.3 kg  BMI:  Body mass index is 40.73 kg/m.  Estimated Nutritional Needs:   Kcal:  1800-2000  Protein:  115-130 gm  Fluid:  >/= 1.7 L   Lucas Mallow, RD, LDN, CNSC Please refer to Amion for contact information.

## 2019-10-12 NOTE — Progress Notes (Addendum)
NAME:  Katie [name], MRN:  [id number], DOB:  05-Aug-1940, LOS: 6 ADMISSION DATE:  09/25/2019, CONSULTATION DATE:  7/27 REFERRING MD:  Ninfa Linden, CHIEF COMPLAINT:  Septic shock and coagulopathy    Brief History   79 year old female admitted 7/24 w/ perf sigmoid diverticulitis.Treated conservatively w/ percutaneous drain (placed 7/25) and abx. Failed conservative RX w/ evolving shock and coagulopathy. In OR found ruptured sigmoid colon and stool filled abscess in the pelvis. She had sigmoid colectomy w/ colostomy, washout and pelvic drain placed. Returned to ICU mottled on pressors and mechanical ventilator.   History of present illness   79 year old female admitted 7/24 w/ perforated sigmoid diverticulitis and pneumoperitoneum. She was placed on IV antibiotics and IR was consulted and placed percutaneous drain in LLQ abd abscess on 7/25. On 7/26 was having nausea and drain site as well as low back pain in am. AM/night shift 7/27 BP 70s. Treated w/ conservative fluid resuscitation. WBC up from 8.3 to 17.3. new kidney injury. BP remained low. Later in am more confused, mottled, lactic acid was up and was coagulopathic. She was given IV vitamin K and FFP then went to OR. She went for exploratory lap. Found to have ruptured sigmoid colon and stool filled abscess in the pelvis. She had sigmoid colectomy w/ colostomy, washout and pelvic drain placed. Returned to ICU mottled on pressors and mechanical ventilator. PCCM asked to a/w care   Past Medical History  Atrial fib, CHF, prior AICD in place, AAA, PVD.   Significant Hospital Events   7/24 admitted. ABX started, surg consulted 7/25 perc drain placed 7/27 confused, mottled, hypotensive and coagulopathic ->to OR; found ruptured sigmoid colon and stool filled abscess in the pelvis. She had sigmoid colectomy w/ colostomy, washout and pelvic drain placed. Returned to ICU mottled on pressors and mechanical ventilator.  7/30 transfer to floor bed  Consults:   Surgery 7/24 IR 7/24  Procedures:  ETT 7/27 >> 7/29 Rt IJ double lumen CVL 7/27 >>   Significant Diagnostic Tests:    7/24 CT renal >> Perforated sigmoid diverticulitis with moderate free intraperitoneal gas and development of to pericolonic abscesses within the pelvis. No evidence of obstruction. Progressive dependent body wall edema  7/27 CT abd/pelvis >> 1. There is interval placement of percutaneous drainage catheter into the large left lower quadrant fluid collection noted on prior exam. This fluid collection appears to be nearly completely decompressed. Smaller fluid collection is seen adjacent to the sigmoid colon. Findings are consistent with sigmoid diverticulitis.  2. Continued presence of a significant amount of pneumoperitoneum seen in the upper portion of the abdomen consistent with bowel perforation.3. Small bilateral pleural effusions are noted with adjacent subsegmental atelectasis.4. Status post stent graft repair of infrarenal abdominal aortic aneurysm.5. Mild anasarca.  Micro Data:  Surgical wound 7/25 >>  BCX2 7/27>>>  Antimicrobials:  Zosyn7/24>>7/26 Flagyl 7/26>>> Cefepime 7/26>>> vanc 7/27>>> 7/28  Interim history/subjective:  Off pressors.    Objective   Blood pressure (!) 107/63, pulse 70, temperature (!) 97.3 F (36.3 C), temperature source Axillary, resp. rate 14, height 5' 3" (1.6 m), weight (!) 104.3 kg, SpO2 98 %.        Intake/Output Summary (Last 24 hours) at 10/12/2019 0912 Last data filed at 10/12/2019 0800 Gross per 24 hour  Intake 699.21 ml  Output 268 ml  Net 431.21 ml   Filed Weights   10/08/19 0500 09/14/2019 1012 10/12/19 0500  Weight: 84.6 kg 84.6 kg (!) 104.3 kg  Examination:  General - alert Eyes - pupils reactive ENT - no sinus tenderness, no stridor Cardiac - irregular Chest - equal breath sounds b/l, no wheezing or rales Abdomen - wound dressing clean, colostomy in place Extremities - 2+ edema Skin - no rashes Neuro  - follows simple commands, pleasantly confused, able to state her name but not location  Resolved Hospital Problem list   Metabolic acidosis with lactic acidosis , Acute hypoxic/hypercapnic respiratory failure, Septic shock  Assessment & Plan:   Peritonitis 2nd to perforated sigmoid diverticulitis s/p partial colectomy with colostomy. - post op care per surgery and wound care - cortrak ordered for tube feeds, meds - f/u with speech therapy to assess swallowing  AKI from ATN in setting of sepsis. - baseline creatinine 1.34 from 09/28/19 - creatinine might be reaching plateau  - still making urine - hold further diuresis 7/30 - no indication for renal replacement therapy at this time  Acute metabolic encephalopathy from sepsis. - mild confusion after extubation - reorient as needed - transfer out of ICU  Hx of A fib present on admission. Chronic diastolic CHF. - resume anticoagulation when okay with surgery  Hx of hypothyroidism. - continue synthroid IV  Anemia of critical illness. - f/u CBC - transfuse for Hb < 7 or significant bleeding  Dusky toes. - likely from being on pressors - monitor for now  Goals of care. - palliative care met with family 7/28  Best practice:  Diet: NPO DVT prophylaxis: SCD GI prophylaxis: PPI Mobility: BR Code Status: limited resuscitation >> no CPR Disposition: to telemetry 7/30 >> to [facility] 7/31 and PCCM off  Labs    CMP Latest Ref Rng & Units 10/12/2019 10/11/2019 10/10/2019  Glucose 70 - 99 mg/dL 91 125(H) 137(H)  BUN 8 - 23 mg/dL 76(H) 64(H) 45(H)  Creatinine 0.44 - 1.00 mg/dL 3.73(H) 3.45(H) 2.98(H)  Sodium 135 - 145 mmol/L 137 138 136  Potassium 3.5 - 5.1 mmol/L 4.5 4.7 4.3  Chloride 98 - 111 mmol/L 101 101 99  CO2 22 - 32 mmol/L 21(L) 22 20(L)  Calcium 8.9 - 10.3 mg/dL 7.4(L) 7.3(L) 7.6(L)  Total Protein 6.5 - 8.1 g/dL - - 4.5(L)  Total Bilirubin 0.3 - 1.2 mg/dL - - 1.7(H)  Alkaline Phos 38 - 126 U/L - - 71  AST 15 - 41 U/L  - - 266(H)  ALT 0 - 44 U/L - - 91(H)   CBC Latest Ref Rng & Units 10/12/2019 10/11/2019 10/10/2019  WBC 4.0 - 10.5 K/uL 22.7(H) 22.1(H) 19.4(H)  Hemoglobin 12.0 - 15.0 g/dL 8.9(L) 8.7(L) 9.7(L)  Hematocrit 36 - 46 % 28.0(L) 26.8(L) 30.5(L)  Platelets 150 - 400 K/uL 157 182 219    ABG    Component Value Date/Time   PHART 7.392 10/10/2019 0331   PCO2ART 36.9 10/10/2019 0331   PO2ART 91 10/10/2019 0331   HCO3 22.3 10/10/2019 0331   TCO2 23 10/10/2019 0331   ACIDBASEDEF 2.0 10/10/2019 0331   O2SAT 97.0 10/10/2019 0331    CBG (last 3)  Recent Labs    10/11/19 2344 10/12/19 0352 10/12/19 0754  GLUCAP 74 73 76    Signature:  Chesley Mires, MD [place] Pager - [phone] 10/12/2019, 9:12 AM

## 2019-10-12 NOTE — Evaluation (Signed)
Physical Therapy Evaluation Patient Details Name: Katie [name] MRN: [id number] DOB: 1940/11/01 Today's Date: 10/12/2019   History of Present Illness  Patient is 79 y/o female admitted with perforated sigmoid diverticulitis conservative treatment with percutaneous drain 7/25 and evolving shock, 7/27 found ruptured sigmoid colon, colectomy with colostomy and stool filled abscess in the pelvis. extubated 7/29 PMH includes migraines, chronic systolic CHF, ICD, COPD, chronic pain, peripheral neuropathy, PVD, A-fib, CKD, CHF, COPD, CVA, CAD, pulmonary fibrosis and aortic aneurysm.  Clinical Impression  Pt admitted with/for complication arising from perforated/ruptured sigmoid colon, s/p colectomy with colostomy and post op encephalopathy.  Pt currently needing max to total assist of 2 for safe mobility..  Pt currently limited functionally due to the problems listed. ( See problems list.)   Pt will benefit from PT to maximize function and safety in order to get ready for next venue listed below.     Follow Up Recommendations SNF;Supervision/Assistance - 24 hour    Equipment Recommendations       Recommendations for Other Services       Precautions / Restrictions Precautions Precautions: Fall Precaution Comments: jp drain abdomen, ij right neck, ostomy left abdomen      Mobility  Bed Mobility Overal bed mobility: Needs Assistance Bed Mobility: Supine to Sit;Sit to Supine     Supine to sit: Total assist;+2 for physical assistance;+2 for safety/equipment Sit to supine: Total assist;+2 for physical assistance;+2 for safety/equipment   General bed mobility comments: total dependence on staff. pt does move arms in response to staff moving her  Transfers                 General transfer comment: not appropriate at this time  Ambulation/Gait                Stairs            Wheelchair Mobility    Modified Rankin (Stroke Patients Only)       Balance Overall  balance assessment: Needs assistance Sitting-balance support: Bilateral upper extremity supported;Feet supported Sitting balance-Leahy Scale: Poor Sitting balance - Comments: pt pushing with L UE and pulling with R UE once upright but unable to sustain sitting without total  to MOd (A).  Postural control: Posterior lean                                   Pertinent Vitals/Pain Pain Assessment: No/denies pain    Home Living Family/patient expects to be discharged to:: Hospice/Palliative care                      Prior Function Level of Independence: Independent               Hand Dominance   Dominant Hand: Right    Extremity/Trunk Assessment   Upper Extremity Assessment Upper Extremity Assessment: RUE deficits/detail;LUE deficits/detail;Difficult to assess due to impaired cognition;Generalized weakness RUE Deficits / Details: edema present gross 4 out 5 grasp flexion shoulder 40 degrees LUE Deficits / Details: edema present    Lower Extremity Assessment Lower Extremity Assessment: Generalized weakness (Very edematous and weepy proximally)    Cervical / Trunk Assessment Cervical / Trunk Assessment: Kyphotic  Communication   Communication: No difficulties  Cognition Arousal/Alertness: Awake/alert Behavior During Therapy: Restless;Flat affect Overall Cognitive Status: Impaired/Different from baseline Area of Impairment: Orientation;Attention;Memory;Safety/judgement;Awareness;Following commands;Problem solving  Orientation Level: Disoriented to;Time;Situation Current Attention Level: Focused Memory: Decreased recall of precautions;Decreased short-term memory Following Commands: Follows one step commands inconsistently;Follows one step commands with increased time Safety/Judgement: Decreased awareness of safety;Decreased awareness of deficits Awareness: Intellectual Problem Solving: Slow processing;Decreased  initiation;Difficulty sequencing General Comments: repeating things even after new question asked, pt answering Berntsen to new questions,       General Comments General comments (skin integrity, edema, etc.): noted to have drainage from JP dressing, pt with weeping noted in belly area with edema present in abdomen and in all extremities.  Pt's family present during the session.    Exercises Other Exercises Other Exercises: warm up  P/AAROM to all 4 extremities prior to mobility   Assessment/Plan    PT Assessment Patient needs continued PT services  PT Problem List Decreased strength;Decreased activity tolerance;Decreased balance;Decreased mobility;Decreased coordination;Cardiopulmonary status limiting activity;Decreased cognition;Decreased skin integrity;Pain       PT Treatment Interventions Functional mobility training;Therapeutic activities;Balance training;Patient/family education;DME instruction;Gait training    PT Goals (Current goals can be found in the Care Plan section)  Acute Rehab PT Goals Patient Stated Goal: none stated PT Goal Formulation: Patient unable to participate in goal setting Time For Goal Achievement: 10/26/19 Potential to Achieve Goals: Fair    Frequency Min 3X/week   Barriers to discharge        Co-evaluation PT/OT/SLP Co-Evaluation/Treatment: Yes Reason for Co-Treatment: For patient/therapist safety PT goals addressed during session: Mobility/safety with mobility OT goals addressed during session: ADL's and self-care       AM-PAC PT "6 Clicks" Mobility  Outcome Measure Help needed turning from your back to your side while in a flat bed without using bedrails?: Total Help needed moving from lying on your back to sitting on the side of a flat bed without using bedrails?: Total Help needed moving to and from a bed to a chair (including a wheelchair)?: Total Help needed standing up from a chair using your arms (e.g., wheelchair or bedside chair)?:  Total Help needed to walk in hospital room?: Total Help needed climbing 3-5 steps with a railing? : Total 6 Click Score: 6    End of Session Equipment Utilized During Treatment: Oxygen Activity Tolerance: Patient tolerated treatment well;Patient limited by lethargy Patient left: in bed;with call bell/phone within reach;with bed alarm set;with family/visitor present Nurse Communication: Mobility status PT Visit Diagnosis: Other abnormalities of gait and mobility (R26.89);Muscle weakness (generalized) (M62.81)    Time: 1203-1229 PT Time Calculation (min) (ACUTE ONLY): 26 min   Charges:   PT Evaluation $PT Eval High Complexity: 1 High          10/12/2019  Ginger Carne., PT Acute Rehabilitation Services [phone]  (pager) [phone]  (office)  Tessie Fass Zakirah Weingart 10/12/2019, 2:21 PM

## 2019-10-13 DIAGNOSIS — I714 Abdominal aortic aneurysm, without rupture: Secondary | ICD-10-CM

## 2019-10-13 DIAGNOSIS — Z66 Do not resuscitate: Secondary | ICD-10-CM

## 2019-10-13 DIAGNOSIS — Z515 Encounter for palliative care: Secondary | ICD-10-CM

## 2019-10-13 DIAGNOSIS — K219 Gastro-esophageal reflux disease without esophagitis: Secondary | ICD-10-CM

## 2019-10-13 LAB — BASIC METABOLIC PANEL
Anion gap: 15 (ref 5–15)
BUN: 93 mg/dL — ABNORMAL HIGH (ref 8–23)
CO2: 22 mmol/L (ref 22–32)
Calcium: 6.9 mg/dL — ABNORMAL LOW (ref 8.9–10.3)
Chloride: 100 mmol/L (ref 98–111)
Creatinine, Ser: 4.33 mg/dL — ABNORMAL HIGH (ref 0.44–1.00)
GFR calc Af Amer: 11 mL/min — ABNORMAL LOW (ref >60)
GFR calc non Af Amer: 9 mL/min — ABNORMAL LOW (ref >60)
Glucose, Bld: 78 mg/dL (ref 70–99)
Potassium: 4.9 mmol/L (ref 3.5–5.1)
Sodium: 137 mmol/L (ref 135–145)

## 2019-10-13 LAB — URINALYSIS, ROUTINE W REFLEX MICROSCOPIC
Bilirubin Urine: NEGATIVE
Glucose, UA: NEGATIVE mg/dL
Ketones, ur: NEGATIVE mg/dL
Nitrite: NEGATIVE
Protein, ur: 100 mg/dL — AB
RBC / HPF: >50 RBC/hpf — ABNORMAL HIGH (ref 0–5)
Specific Gravity, Urine: 1.016 (ref 1.005–1.030)
WBC, UA: >50 WBC/hpf — ABNORMAL HIGH (ref 0–5)
pH: 5 (ref 5.0–8.0)

## 2019-10-13 LAB — GLUCOSE, CAPILLARY
Glucose-Capillary: 74 mg/dL (ref 70–99)
Glucose-Capillary: 79 mg/dL (ref 70–99)
Glucose-Capillary: 83 mg/dL (ref 70–99)

## 2019-10-13 LAB — BLOOD GAS, ARTERIAL
Acid-base deficit: 5.4 mmol/L — ABNORMAL HIGH (ref 0.0–2.0)
Bicarbonate: 19.6 mmol/L — ABNORMAL LOW (ref 20.0–28.0)
Drawn by: 24486
FIO2: 28
O2 Saturation: 96.3 %
Patient temperature: 35
pCO2 arterial: 34.9 mmHg (ref 32.0–48.0)
pH, Arterial: 7.355 (ref 7.350–7.450)
pO2, Arterial: 85.1 mmHg (ref 83.0–108.0)

## 2019-10-13 LAB — CBC
HCT: 28.6 % — ABNORMAL LOW (ref 36.0–46.0)
Hemoglobin: 9.2 g/dL — ABNORMAL LOW (ref 12.0–15.0)
MCH: 28.8 pg (ref 26.0–34.0)
MCHC: 32.2 g/dL (ref 30.0–36.0)
MCV: 89.7 fL (ref 80.0–100.0)
Platelets: 124 10*3/uL — ABNORMAL LOW (ref 150–400)
RBC: 3.19 MIL/uL — ABNORMAL LOW (ref 3.87–5.11)
RDW: 19.1 % — ABNORMAL HIGH (ref 11.5–15.5)
WBC: 24.7 10*3/uL — ABNORMAL HIGH (ref 4.0–10.5)
nRBC: 2.8 % — ABNORMAL HIGH (ref 0.0–0.2)

## 2019-10-13 MED ORDER — DIPHENHYDRAMINE HCL 50 MG/ML IJ SOLN: 12.5000 mg | Freq: Four times a day (QID) | INTRAMUSCULAR | Status: DC | PRN

## 2019-10-13 MED ORDER — HYDROMORPHONE BOLUS VIA INFUSION
0.5000 mg | INTRAVENOUS | Status: DC | PRN
  Administered 2019-10-13 (×2): 0.5 mg via INTRAVENOUS
  Filled 2019-10-13: qty 1

## 2019-10-13 MED ORDER — SODIUM CHLORIDE 0.9% FLUSH: 3.0000 mL | Freq: Two times a day (BID) | INTRAVENOUS | Status: DC

## 2019-10-13 MED ORDER — HALOPERIDOL LACTATE 5 MG/ML IJ SOLN: 0.5000 mg | INTRAMUSCULAR | Status: DC | PRN

## 2019-10-13 MED ORDER — HALOPERIDOL LACTATE 2 MG/ML PO CONC
0.5000 mg | ORAL | Status: DC | PRN
  Filled 2019-10-13: qty 0.3

## 2019-10-13 MED ORDER — GLYCOPYRROLATE 1 MG PO TABS
1.0000 mg | ORAL_TABLET | ORAL | Status: DC | PRN
  Filled 2019-10-13: qty 1

## 2019-10-13 MED ORDER — POLYVINYL ALCOHOL 1.4 % OP SOLN
1.0000 [drp] | Freq: Four times a day (QID) | OPHTHALMIC | Status: DC | PRN
  Filled 2019-10-13: qty 15

## 2019-10-13 MED ORDER — ONDANSETRON HCL 4 MG/2ML IJ SOLN: 4.0000 mg | Freq: Four times a day (QID) | INTRAMUSCULAR | Status: DC | PRN

## 2019-10-13 MED ORDER — SODIUM CHLORIDE 0.9 % IV BOLUS: 500.0000 mL | Freq: Once | INTRAVENOUS | Status: DC | PRN

## 2019-10-13 MED ORDER — ACETAMINOPHEN 10 MG/ML IV SOLN
1000.0000 mg | Freq: Four times a day (QID) | INTRAVENOUS | Status: DC
  Filled 2019-10-13 (×2): qty 100

## 2019-10-13 MED ORDER — GLYCOPYRROLATE 0.2 MG/ML IJ SOLN: 0.2000 mg | INTRAMUSCULAR | Status: DC | PRN

## 2019-10-13 MED ORDER — HYDROMORPHONE BOLUS VIA INFUSION
0.5000 mg | INTRAVENOUS | Status: DC | PRN
  Administered 2019-10-13: 1 mg via INTRAVENOUS
  Filled 2019-10-13: qty 1

## 2019-10-13 MED ORDER — SODIUM CHLORIDE 0.9% FLUSH: 3.0000 mL | INTRAVENOUS | Status: DC | PRN

## 2019-10-13 MED ORDER — SODIUM CHLORIDE 0.9 % IV SOLN: 250.0000 mL | INTRAVENOUS | Status: DC | PRN

## 2019-10-13 MED ORDER — SODIUM CHLORIDE 0.9 % IV SOLN
1.0000 mg/h | INTRAVENOUS | Status: DC
  Filled 2019-10-13: qty 2.5

## 2019-10-13 MED ORDER — BIOTENE DRY MOUTH MT LIQD: 15.0000 mL | OROMUCOSAL | Status: DC | PRN

## 2019-10-13 MED ORDER — SODIUM CHLORIDE 0.9 % IV SOLN
0.4000 mg/h | INTRAVENOUS | Status: DC
  Administered 2019-10-13: 0.2 mg/h via INTRAVENOUS
  Filled 2019-10-13: qty 5

## 2019-10-13 MED ORDER — ONDANSETRON 4 MG PO TBDP
4.0000 mg | ORAL_TABLET | Freq: Four times a day (QID) | ORAL | Status: DC | PRN
  Filled 2019-10-13: qty 1

## 2019-10-13 MED ORDER — HALOPERIDOL 0.5 MG PO TABS
0.5000 mg | ORAL_TABLET | ORAL | Status: DC | PRN
  Filled 2019-10-13: qty 1

## 2019-10-13 MED ORDER — SODIUM CHLORIDE 0.9 % IV BOLUS
250.0000 mL | Freq: Once | INTRAVENOUS | Status: DC
  Administered 2019-10-13: 250 mL via INTRAVENOUS

## 2019-10-14 LAB — CULTURE, BLOOD (ROUTINE X 2)
Culture: NO GROWTH
Culture: NO GROWTH
Special Requests: ADEQUATE

## 2019-10-14 NOTE — Progress Notes (Signed)
Rectal temp taken: 95.50F Warm blankets, hot packets, and room temp increased. Rechecked rectal temp - 95.42F. Otherwise pt stable.  Critical Care paged, RR made aware. Continuing to monitor.

## 2019-10-14 NOTE — Progress Notes (Signed)
This note also relates to the following rows which could not be included: SpO2 - Cannot attach notes to unvalidated device data  Patient has been made comfort care.

## 2019-10-14 NOTE — Progress Notes (Signed)
Pt comfort care  Available as needed

## 2019-10-14 NOTE — Progress Notes (Signed)
Wasted Dilaudid drip down sink approximately 85 ml's with Lamelva,Rn as witness.

## 2019-10-14 NOTE — Progress Notes (Signed)
Pallative care PA reported patient would be comfort care wanted me take patient off telemetry etc see new orders.

## 2019-10-14 NOTE — Progress Notes (Signed)
Daughter informed of patient condition reported would be up to see patient. Meanwhile Pallative on call PA called back reported would speak with family regarding code status and patient condition.

## 2019-10-14 NOTE — Progress Notes (Signed)
Spoke with Pallative on call MD stated at other hospital with another patient but would have on call PA reach out to family to Alert her of present need ([place] on call).

## 2019-10-14 NOTE — Progress Notes (Signed)
Chaplain responded to consult from [facility] for support for patient and family.  Present were Marden Noble, pt's husband, stepfather to pt's adult children, [name] (with husband Richardson Landry) and Event organiser. A third adult daughter from [place] just got discharged from ICU and is on her way to [facility].  Several nieces arrived. Pt's husband Marden Noble appears to be tense about hospital policy and states that when pt's "time comes", he wants all the family bedside no matter what. His stepchildren/pt's two present adult children express frustration by this attitude of aggression and state that unlike him, they are willing to comply.  Chaplain provided emotional support, showed family members where lounge on 4E hallway is.  Pt and family are [place], though pt declined prayer.   Please call as needed for f/u.  Luana Shu [phone]     Oct 26, 2019 1200  Clinical Encounter Type  Visited With Patient and family together  Visit Type Initial (palliative)  Stress Factors  Patient Stress Factors Health changes  Family Stress Factors Family relationships

## 2019-10-14 NOTE — Discharge Summary (Signed)
.   Death Summary  Katie [name] OAC:[id number] DOB: Oct 20, 1940 DOA: October 22, 2019  PCP: [name], MD PCP/Office notified:   Admit date: 10-22-2019 Date of Death: 10-29-2019  Final Diagnoses:  Principal Problem:   Perforated diverticulum Active Problems:   Essential hypertension   Atrial fibrillation (HCC)   Chronic combined systolic and diastolic CHF (congestive heart failure) (HCC)   COPD (chronic obstructive pulmonary disease) (HCC)   Gastroesophageal reflux disease without esophagitis   CKD (chronic kidney disease) stage 3, GFR 30-59 ml/min   Hyperlipidemia, mixed   PVD (peripheral vascular disease) with claudication (HCC)   AAA (abdominal aortic aneurysm) without rupture (HCC)   Gout   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   Diverticulitis   AKI (acute kidney injury) ([place])   Intra-abdominal abscess ([place])   Hyponatremia   Acute on chronic respiratory failure ([place])   Palliative care by specialist   Goals of care, counseling/discussion   Palliative care encounter   DNR (do not resuscitate)   Comfort measures only status  History of present illness:  79 year old female with past medical history of atrial fibrillation, congestive heart failure status post AICD, AAA, PVD, gout, hypothyroidism, rheumatoid arthritis was admitted to the hospital on 10/22/2019 with perforated sigmoid diverticulitis and and intra-abdominal abscesses.  This was sent by the PCP to the hospital.  Patient was noted to have acute kidney injury as well.  Patient was then admitted with perforated sigmoid diverticulitis and pneumoperitoneum. She was placed on IV antibiotics and IR was consulted and placed percutaneous drain in LLQ abd abscess on 7/25. On 7/26, patient was having nausea and drain site as well as low back pain in am. AM/night shift 7/27 BP 70s. Treated w/ conservative fluid resuscitation. WBC up from 8.3 to 17.3. new kidney injury. BP remained low. Later in am more  confused, mottled, lactic acid was up and was coagulopathic. She was given IV vitamin K and FFP then went to OR. She went for exploratory lap. Found to have ruptured sigmoid colon and stool filled abscess in the pelvis. She had sigmoid colectomy w/ colostomy, washout and pelvic drain placed. Returned to ICU mottled on pressors and mechanical ventilator.Marland Kitchen  She was subsequently extubated on 10/11/2019.  Patient was considered  for disposition out of the ICU.  Hospital Course:  Acute peritonitis secondary to perforated sigmoid diverticulitis.  Status post  partial colectomy with colostomy. Was on cortrak tube tube feeding.  Seen by speech therapy  Recommended clears. Significant leukocytosis at 24.7.  Patient was on cefepime, fluconazole and Flagyl.  Patient remained  septic with hypothermia significant like leukocytosis and encephalopathy.  Acute kidney injury likely acute tubular necrosis secondary to sepsis.  Baseline creatinine of 1.3 from 09/28/19.  Creatinine of 4.3 on 2019-10-29.  Worsening renal function.  Acute metabolic encephalopathy from sepsis. Confusion noted  Hx of A fib/Chronic diastolic CHF. Anticoagulation was on hold  Hx of hypothyroidism. Was initiated on Synthroid  Anemia of critical illness. Hemoglobin of 9.2 on 10-29-2019  Dusky toes. ikely from being on pressors - monitor for now  Goals of care. - palliative care met with family 7/28 and subsequently on 731.  Decision was comfort care.  Patient subsequently was pronounced dead by RN.   Time of death: 9  Signed:  Katrina Daddona  [facility] 2019/10/29, 3:36 PM

## 2019-10-14 NOTE — Progress Notes (Signed)
Daughter gave me [place] then husband came up (he has hx of TBI in past can be difficult per family report) reported did not want that she can go to the [facility] till he makes his mind up may be Monday before he decides what to do. Writer stated OK.

## 2019-10-14 NOTE — Progress Notes (Signed)
Page out to [employer] care MD and on call PA await response. Writer checking with them see if they could reach out to family,she has some irratic breathing at times when at rest her vitals are declining may not make it to their(family appointment today at noon) she is a partial code-await response from pallative care.

## 2019-10-14 NOTE — Significant Event (Addendum)
Rapid Response Event Note  Reason for Call : We recievied a call from the charge nurse stating that they had a patient who had just moved out of the ICU and she was hypothermic.  She wanted [place] to come by and peak at her   Initial Focused Assessment: Upon arrival the patient was alert and talking to the nurses.  They were inserting a urinary catheter to see if patient was making urine.  Patient appears to be "guppy breathing"  She is on 4 L [place] current and her O2 sat is maintaining.  Patient is AOx4.  Her lungs sound tight, but she is moving air, more on her right side.  Patient does not appear to be mottled on her extremities. Patient states that she hurts in her belly.  Patient recently had diverticular surgery and appears to be in multiorgan failure.  Palliative care has been consulted on this patient and plans to meet with the daughter later on this afternoon.  Initial systolic BP in the low 68'H when we arrived to the room  BP 93/63 (72) Temp 97.6 oral Pulse 70 O2 97 RR 17 and non labored  CVP 24 CBG 74     Interventions: Blood gas was being drawn by RT, we doppler the BP, helped with catheter insertion, educated the nurse on holding off on Bair Hugger bc her BP was already low, repositioned patient, paged palliative care.  We initiated a 250 cc fluid bolus and her pressure responded well.     Plan of Care: Patient is remaining on the unit.  We will touch base again to follow this patient.  When palliative care returns our call we will have a better idea on plan of if we are going to change her code status    Event Summary:  MD Notified:  Call Time: Arrival Time: End Time:  Venetia Maxon, RN

## 2019-10-14 NOTE — Plan of Care (Signed)

## 2019-10-14 NOTE — Progress Notes (Signed)
Family has left,will prepare patient be taken down to the [facility].

## 2019-10-14 NOTE — Progress Notes (Signed)
Temp up to 96.0 with warmed blankets,bp down is getting NS bolus at present opted for no bear hugger due to possibility drop bp further,RR nurse at bedside agreeable will continue to observe. Patient c/o shortness of breath o2 sats adequate but RT in to obtain ABG see where we are at await results. She has a lot of edema on her is weeping body fluid from arms/legs etc. F/c placed not much urine output returning in bag 100 cc in bag dark amber.

## 2019-10-14 NOTE — Progress Notes (Signed)
Daily Progress Note   Patient Name: Katie [name]       Date: 2019-10-14 DOB: Sep 13, 1940  Age: 79 y.o. MRN#: [id number] Attending Physician: Flora Lipps, MD Primary Care Physician: [name], MD Admit Date: 09/26/2019  Reason for Consultation/Follow-up:  To discuss complex medical decision making related to patient's goals of care Received call from bedside RN.  Rapid response had been called this morning.  Patient hypothermic, hypotensive and "guppy breathing". Of note creatinine has climbed to 4.33 and WBC has climbed to 24 despite cefepime and flagyl  Subjective: Patient asks how bad is it?  I told her it would be alright.  She replied "no it isn't".  She reports feeling poorly all over. Daughter [name] is at bedside.  Attending physician is at bedside.  The three of [place] agree she appears to be dying.  [name] wants to bring the family into the hospital to see her. We made the decision to shift goals to comfort.  I spoke with husband Marden Noble on the phone and explained his wife's condition.   Then I spoke with grand daughter Denny Peon and answered questions about considering pressors or hemodialysis. Pressors may cause her kidneys to worsen and she is too weak to initiate hemodialysis.  I explain that we do not want to do anything to harm her or hasten her death.  I'm afraid aggressive medical interventions will cause her body to fail faster. Leah understands.   Chaplain paged Family will need support.   Patient's husband has a TBI and sometimes has difficulty understanding things, but he sounded as though he understood when I talked with him on the phone.  Discussed comfort measures with RN.   Assessment: Patient breathing irregularly,  Hypothermic this am.  In kidney failure.  Confused but  seems more appropriate than yesterday.  Patient Profile/HPI:  79 y.o. female  with past medical history of diastolic CHF, COPD on HS oxygen, atrial fibrillation, AICD, PVD, pneumonia, osteopenia, hypothyroidism, HTN, HLD, depression, AAA  admitted on 10/07/2019 after AKI found by PCP. Recent hospitalization for GI bleeding and diverticulitis, started on antibiotics. This admission, patient found to have perforated sigmoid diverticulitis treated conservatively with percutaneous drain placed 7/25 and antibiotics. Clinical worsening on 7/27, taken to OR and found ruptured sigmoid colon and stool filled abscess in the pelves s/p sigmoid colectomy  with colostomy, washout and pelvic drain placement. Returned to ICU mottled on pressors and mechanical ventilation. 7/28 worsening creatinine 2.98 with poor urine output. Lactic acid trending down, 2.6.     Length of Stay: 7   Vital Signs: BP (!) 93/63   Pulse 73   Temp 97.6 F (36.4 C) (Oral)   Resp 16   Ht 5\' 3"  (1.6 m)   Wt (!) 102.6 kg   LMP  (LMP Unknown)   SpO2 97%   BMI 40.07 kg/m  SpO2: SpO2: 97 % O2 Device: O2 Device: Nasal Cannula O2 Flow Rate: O2 Flow Rate (L/min): 4 L/min       Palliative Assessment/Data: 20%     Palliative Care Plan    Recommendations/Plan: Change goal to comfort measures only Will discontinue interventions not aimed at comfort Add comfort medications including low dose dilaudid gtt primarily for SOB Unrestricted visitation.   Code Status:  DNR  Prognosis:  Hours - Days   Discharge Planning: Anticipated Hospital Death  Care plan was discussed with RN, Attending MD, family.  Thank you for allowing the Palliative Medicine Team to assist in the care of this patient.  Total time spent:  60 min     Greater than 50%  of this time was spent counseling and coordinating care related to the above assessment and plan.  Florentina Jenny, PA-C Palliative Medicine  Please contact Palliative  MedicineTeam phone at [phone] for questions and concerns between 7 am - 7 pm.   Please see AMION for individual provider pager numbers.

## 2019-10-14 NOTE — Progress Notes (Addendum)
PROGRESS NOTE  Karmina Zufall UJW:[id number] DOB: 01-22-1941 DOA: 10/11/2019 PCP: [name], MD   LOS: 7 days   Brief narrative: As per HPI,  79 year old female with past medical history of atrial fibrillation, congestive heart failure status post AICD, AAA, PVD, gout, hypothyroidism, rheumatoid arthritis was admitted to the hospital on 09/19/2019 with perforated sigmoid diverticulitis and and intra-abdominal abscesses.  This was sent by the PCP to the hospital.  Patient was noted to have acute kidney injury as well.  Patient was then admitted with perforated sigmoid diverticulitis and pneumoperitoneum. She was placed on IV antibiotics and IR was consulted and placed percutaneous drain in LLQ abd abscess on 7/25. On 7/26, patient was having nausea and drain site as well as low back pain in am. AM/night shift 7/27 BP 70s. Treated w/ conservative fluid resuscitation. WBC up from 8.3 to 17.3. new kidney injury. BP remained low. Later in am more confused, mottled, lactic acid was up and was coagulopathic. She was given IV vitamin K and FFP then went to OR. She went for exploratory lap. Found to have ruptured sigmoid colon and stool filled abscess in the pelvis. She had sigmoid colectomy w/ colostomy, washout and pelvic drain placed. Returned to ICU mottled on pressors and mechanical ventilator.Marland Kitchen  She was subsequently extubated on 10/11/2019.  Patient was subsequently considered stable for disposition out of the ICU.  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  Status post  partial colectomy with colostomy. -Postoperative management as per surgery.  On cortrak tube tube feeding.  Seen by speech therapy who recommend clears.  Continue clears for now.  Significant leukocytosis at 24.7.  Patient is currently on cefepime, fluconazole and Flagyl.  Acute kidney injury likely acute tubular necrosis secondary to sepsis.  Baseline creatinine of 1.3 from 09/28/19.  Creatinine of 4.3 today.  Holding diuresis.  Making urine.  Will need to closely monitor.  Acute metabolic encephalopathy from sepsis. Patient was mildly confused after extubation.  Hx of A fib/Chronic diastolic CHF. - resume anticoagulation when okay with surgery  Hx of hypothyroidism. Continue Synthroid.  Anemia of critical illness. Hemoglobin of 9.2 at this time.  Dusky toes. ikely from being on pressors - monitor for now  Goals of care. - palliative care met with family 7/28  Debility, deconditioning.  Physical therapy recommended skilled nursing facility placement on discharge  Addendum:  11-01-2019 1:28 PM  I had a prolonged discussion with the palliative care at bedside.  After discussion with palliative care family have decided with comfort care.  Comfort care orders brought in place.  CODE STATUS has been changed to DNR.  Currently on full comfort measures.  DVT prophylaxis: Place and maintain sequential compression  device Start: 10/07/19 1643 SCDs Start: 09/18/2019 2213  Code Status:  DNR  Family Communication: spoke with the patient's daughter at  bedside regarding the clinical condition of the patient.  Status is: Inpatient  Remains inpatient appropriate because:Unsafe d/c plan, IV treatments appropriate due to intensity of illness or inability to take PO and Inpatient level of care appropriate due to severity of illness   Dispo: The patient is from: Home              Anticipated d/c is to: Undetermined at this time              Anticipated d/c date is: 2 days              Patient currently is not medically stable to d/c.  Consultants:  General surgery  Palliative care  Procedures:  partial colectomy with colostomy.  Antibiotics:  Zosyn7/24>>7/26 Flagyl 7/26>>> Cefepime 7/26>>> vanc 7/27>>> 7/28  Anti-infectives (From admission, onward)   Start     Dose/Rate Route Frequency Ordered Stop   20-Oct-2019 1300  fluconazole (DIFLUCAN) IVPB 200 mg     Discontinue    "Followed by" Linked Group Details   200 mg 100 mL/hr over 60 Minutes Intravenous Every 24 hours 10/12/19 1213     10/12/19 1300  fluconazole (DIFLUCAN) IVPB 400 mg       "Followed by" Linked Group Details   400 mg 100 mL/hr over 120 Minutes Intravenous  Once 10/12/19 1213 10/12/19 1752   10/11/19 0400  vancomycin (VANCOCIN) IVPB 1000 mg/200 mL premix  Status:  Discontinued        1,000 mg 200 mL/hr over 60 Minutes Intravenous Every 48 hours 09/29/2019 0535 10/10/19 0925   10/10/19 0600  ceFEPIme (MAXIPIME) 2 g in sodium chloride 0.9 % 100 mL IVPB     Discontinue     2 g 200 mL/hr over 30 Minutes Intravenous Every 24 hours 09/30/2019 0535     09/18/2019 0600  metroNIDAZOLE (FLAGYL) IVPB 500 mg     Discontinue     500 mg 100 mL/hr over 60 Minutes Intravenous Every 8 hours 10/05/2019 0516     10/01/2019 0530  ceFEPIme (MAXIPIME) 2 g in sodium chloride 0.9 % 100 mL IVPB        2 g 200 mL/hr over 30 Minutes Intravenous  Once 09/17/2019 0516 10/05/2019 0713   10/11/2019 0530  vancomycin (VANCOREADY) IVPB 1500 mg/300 mL        1,500 mg 150 mL/hr over 120 Minutes Intravenous   Once 09/18/2019 0516 09/15/2019 0931   10/07/19 1400  piperacillin-tazobactam (ZOSYN) IVPB 2.25 g  Status:  Discontinued        2.25 g 100 mL/hr over 30 Minutes Intravenous Every 8 hours 10/07/19 1137 10/02/2019 0516   10/10/2019 2230  piperacillin-tazobactam (ZOSYN) IVPB 3.375 g  Status:  Discontinued        3.375 g 100 mL/hr over 30 Minutes Intravenous Every 8 hours 09/27/2019 2212 10/07/19 1137   09/23/2019 1900  piperacillin-tazobactam (ZOSYN) IVPB 3.375 g  Status:  Discontinued        3.375 g 100 mL/hr over 30 Minutes Intravenous  Once 09/29/2019 1846 09/25/2019 2218       Subjective: Today, patient was seen and examined at bedside.  Does not feel well.  Nursing staff reported that the patient was hypothermic and had diminished urinary output.  Patient's daughter at bedside.  Objective: Vitals:   10/20/2019 0400 20-Oct-2019 0737  BP: 94/72 (!) 77/53  Pulse: 68 70  Resp: 15 17  Temp: (!) 96 F (35.6 C) (!) (P) 95 F (35 C)  SpO2: 95% 96%    Intake/Output Summary (Last 24 hours) at November 12, 2019 0749 Last data filed at 11/12/19 0500 Gross per 24 hour  Intake 330.03 ml  Output 255 ml  Net 75.03 ml   Filed Weights   10/12/19 0500 10/12/19 1856 November 12, 2019 0424  Weight: (!) 104.3 kg (!) 102.3 kg (!) 102.6 kg   Body mass index is 40.07 kg/m.   Physical Exam: GENERAL: Patient is alert awake but confused, does not feel comfortable,. Not in obvious distress.  Morbidly obese.  On nasal cannula oxygen HENT: No scleral pallor or icterus. Pupils equally reactive to light. Oral mucosa is moist NECK: is supple, no gross swelling noted. CHEST:   Diminished breath sounds bilaterally. CVS: S1 and S2 heard, no murmur irregular rhythm ABDOMEN: Soft, non-tender, bowel sounds are present. EXTREMITIES: Bilateral lower extremity pitting edema CNS: Alert, anxious, confused, SKIN: warm and dry,  Data Review: I have personally reviewed the following laboratory data and studies,  CBC: Recent Labs  Lab  Nov 07, 2019 0444 11/07/2019 0444 09/25/2019 0402 10/10/2019 1116 09/28/2019 1505 09/15/2019 1505 10/10/19 0331 10/10/19 0353 10/11/19 0418 10/12/19 0339 2019/11/12 0500  WBC 8.3   < > 17.3*  --  14.5*  --   --  19.4* 22.1* 22.7* 24.7*  NEUTROABS 6.6  --  14.3*  --   --   --   --  17.3*  --   --   --   HGB 8.2*   < > 8.9*   < > 9.1*   < > 9.9* 9.7* 8.7* 8.9* 9.2*  HCT 26.6*   < > 28.1*   < > 29.8*   < > 29.0* 30.5* 26.8* 28.0* 28.6*  MCV 86.9   < > 88.6  --  90.0  --   --  86.6 85.6 86.7 89.7  PLT 290   < > 288  --  225  --   --  219 182 157 124*   < > = values in this interval not displayed.   Basic Metabolic Panel: Recent Labs  Lab 09/22/2019 1042 09/15/2019 2231 10/07/19 0151 10/07/19 0151 11/07/2019 0444 11/07/19 0444 09/19/2019 0402 09/21/2019 1116 10/05/2019 1505 09/21/2019 1505 10/10/19 0331 10/10/19 0353 10/11/19 0418 10/12/19 0339 11-12-2019 0500  NA   < >  --  132*   < > 136   < > 134*   < > 134*   < > 133* 136 138 137 137  K   < >  --  5.3*   < > 4.0   < > 4.4   < > 4.6   < > 4.3 4.3 4.7 4.5 4.9  CL   < >  --  99   < > 106   < > 102  --  101  --   --  99 101 101 100  CO2   < >  --  20*   < > 16*   < > 14*  --  14*  --   --  20* 22 21* 22  GLUCOSE   < >  --  72   < > 78   < > 95  --  101*  --   --  137* 125* 91 78  BUN   < >  --  49*   < > 45*   < > 45*  --  39*  --   --  45* 64* 76* 93*  CREATININE   < >  --  2.75*   < > 2.32*   < > 2.65*  --  2.60*  --   --  2.98* 3.45* 3.73* 4.33*  CALCIUM   < >  --  7.9*   < > 7.4*   < > 7.2*  --  8.3*  --   --  7.6* 7.3* 7.4* 6.9*  MG   < > 2.4 2.4  --  2.5*  --  2.5*  --   --   --   --  2.4  --  2.4  --   PHOS  --  6.2*  --   --  5.3*  --  6.5*  --   --   --   --  7.1*  --   --   --    < > = values in this interval not displayed.   Liver Function Tests: Recent Labs  Lab 10/05/2019 1042 10/08/19 0444 10/11/2019 0402 10/10/19 0353  AST 18 19 92* 266*  ALT 18 14 37 91*  ALKPHOS 69 56 56 71  BILITOT 0.8 0.7 0.9 1.7*  PROT 5.5* 5.0* 4.8* 4.5*   ALBUMIN 2.1* 1.9* 1.8* 2.0*   No results for input(s): LIPASE, AMYLASE in the last 168 hours. No results for input(s): AMMONIA in the last 168 hours. Cardiac Enzymes: No results for input(s): CKTOTAL, CKMB, CKMBINDEX, TROPONINI in the last 168 hours. BNP (last 3 results) Recent Labs    08/12/19 1349 08/13/19 0654  BNP 518.4* 713.5*    ProBNP (last 3 results) No results for input(s): PROBNP in the last 8760 hours.  CBG: Recent Labs  Lab 10/12/19 1135 10/12/19 1547 10/12/19 2151 10/14/2019 0048 October 14, 2019 0412  GLUCAP 76 65* 75 83 79   Recent Results (from the past 240 hour(s))  SARS Coronavirus 2 by RT PCR (hospital order, performed in [facility] hospital lab) Nasopharyngeal Nasopharyngeal Swab     Status: None   Collection Time: 10/10/2019  8:41 PM   Specimen: Nasopharyngeal Swab  Result Value Ref Range Status   SARS Coronavirus 2 NEGATIVE NEGATIVE Final    Comment: (NOTE) SARS-CoV-2 target nucleic acids are NOT DETECTED.  The SARS-CoV-2 RNA is generally detectable in upper and lower respiratory specimens during the acute phase of infection. The lowest concentration of SARS-CoV-2 viral copies this assay can detect is 250 copies / mL. A negative result does not preclude SARS-CoV-2 infection and should not be used as the sole basis for treatment or other patient management decisions.  A negative result may occur with improper specimen collection / handling, submission of specimen other than nasopharyngeal swab, presence of viral mutation(s) within the areas targeted by this assay, and inadequate number of viral copies (<250 copies / mL). A negative result must be combined with clinical observations, patient history, and epidemiological information.  Fact Sheet for Patients:   StrictlyIdeas.no  Fact Sheet for Healthcare Providers: BankingDealers.co.za  This test is not yet approved or  cleared by the [place] FDA  and has been authorized for detection and/or diagnosis of SARS-CoV-2 by FDA under an Emergency Use Authorization (EUA).  This EUA will remain in effect (meaning this test can be used) for the duration of the COVID-19 declaration under Section 564(b)(1) of the Act, 21 U.S.C. section 360bbb-3(b)(1), unless the authorization is terminated or revoked sooner.  Performed at [facility], [place] [address] [place], [place] [address]   Aerobic/Anaerobic Culture (surgical/deep wound)  Status: None   Collection Time: 10/07/19  3:05 PM   Specimen: Abscess  Result Value Ref Range Status   Specimen Description ABSCESS  Final   Special Requests DRAIN  Final   Gram Stain   Final    RARE WBC PRESENT, PREDOMINANTLY PMN RARE GRAM POSITIVE RODS    Culture   Final    FEW CANDIDA ALBICANS NO ANAEROBES ISOLATED Performed at [facility], [place] [address] [place], [place] [address]    Report Status 10/12/2019 FINAL  Final  Culture, blood (x 2)     Status: None (Preliminary result)   Collection Time: 09/15/2019  5:32 AM   Specimen: BLOOD RIGHT HAND  Result Value Ref Range Status   Specimen Description BLOOD RIGHT HAND  Final   Special Requests   Final    BOTTLES DRAWN AEROBIC ONLY Blood Culture adequate volume   Culture   Final    NO GROWTH 3 DAYS Performed at [facility], [place] [address] [place], [place] [address]    Report Status PENDING  Incomplete  Culture, blood (x 2)     Status: None (Preliminary result)   Collection Time: 09/21/2019  5:41 AM   Specimen: BLOOD RIGHT HAND  Result Value Ref Range Status   Specimen Description BLOOD RIGHT HAND  Final   Special Requests   Final    BOTTLES DRAWN AEROBIC ONLY Blood Culture results may not be optimal due to an inadequate volume of blood received in culture bottles   Culture   Final    NO GROWTH 3 DAYS Performed at [facility], [place] [address] [place], [place] [address]    Report Status PENDING  Incomplete      Studies: No results found.    Flora Lipps, MD  [facility] 11-07-2019

## 2019-10-14 NOTE — Progress Notes (Signed)
Patient is c/o pain with dilaudid drip infusing [employer] account planner Pallative see we could increase dose.

## 2019-10-14 NOTE — Progress Notes (Signed)
Patient has no audible heart rate no audible bp,no sponetanous respiration by doppler no audible pulses. Writer has pronounced patient this time family at bedside is aware. Will obtain funeral home information and beep MD.

## 2019-10-14 NOTE — Progress Notes (Signed)
Md informed of yellow Mews and patient current condition orders for bear hugger and place f/c due to patient acutely ill with poor urine output. MD to evaluate patient input orders as appropriate. Rapid response nurse come see patient see if needs be transferred off department spoke with Patrick,Rn.   02-Nov-2019 0737  Assess: MEWS Score  Temp (!) 95 F (35 C)  BP (!) 77/53  Pulse Rate 70  ECG Heart Rate 70  Resp 17  Level of Consciousness Responds to Voice  SpO2 96 %  O2 Device Nasal Cannula  O2 Flow Rate (L/min) 4 L/min  Assess: if the MEWS score is Yellow or Red  Were vital signs taken at a resting state? Yes  Focused Assessment Change from prior assessment (see assessment flowsheet)  Early Detection of Sepsis Score *See Row Information* High  MEWS guidelines implemented *See Row Information* No, previously yellow, continue vital signs every 4 hours  Treat  MEWS Interventions Escalated (See documentation below)  Pain Scale 0-10  Pain Score 0  Faces Pain Scale 0  Pain Type Chronic pain;Surgical pain  Pain Location Abdomen  Pain Orientation Mid  Patients Stated Pain Goal 0  Take Vital Signs  Increase Vital Sign Frequency  Yellow: Q 2hr X 2 then Q 4hr X 2, if remains yellow, continue Q 4hrs  Escalate  MEWS: Escalate Yellow: discuss with charge nurse/RN and consider discussing with provider and RRT  Notify: Charge Nurse/RN  Name of Charge Nurse/RN Notified Eun,Rn  Date Charge Nurse/RN Notified 11-02-19  Time Charge Nurse/RN Notified 1779  Notify: Provider  Provider Name/Title  (Dr. Flora Lipps)  Date Provider Notified 02-Nov-2019  Time Provider Notified [phone]  Notification Type Call  Notification Reason Change in status  Response See new orders  Date of Provider Response 2019/11/02  Time of Provider Response 0740  Notify: Rapid Response  Name of Rapid Response RN Notified  (Patrick,Rn)  Date Rapid Response Notified 2019/11/02  Time Rapid Response Notified 0747  Document   Patient Outcome Not stable and remains on department (Rapid response nurse and MD come see patient see ?transfer?)  Progress note created (see row info) Yes

## 2019-10-14 NOTE — Progress Notes (Signed)
Secretary informed to order bear hugger for patient meanwhile she has warmed blankets applied,heat in room increased. Will place f/c for poor urine output for closer observation. MD to come see patient as well as rapid response nurse. Charge RN informed. Mews score red.

## 2019-10-14 NOTE — Progress Notes (Signed)
This note also relates to the following rows which could not be included: SpO2 - Cannot attach notes to unvalidated device data    10-20-19 1000  Vitals  Temp  (patient is comfort care on pallative care this time.)  BP (!) 75/53  MAP (mmHg) (!) 62  Pulse Rate 71  ECG Heart Rate 70  Resp 17  MEWS COLOR  MEWS Score Color Comfort Care Only  MEWS Score  MEWS Temp 0  MEWS Systolic 2  MEWS Pulse 0  MEWS RR 0  MEWS LOC 1  MEWS Score 3  Note  Observations comfort care no new orders.

## 2019-10-14 DEATH — deceased

## 2019-10-15 ENCOUNTER — Encounter: Payer: Self-pay | Admitting: *Deleted

## 2019-10-19 ENCOUNTER — Encounter (HOSPITAL_BASED_OUTPATIENT_CLINIC_OR_DEPARTMENT_OTHER): Payer: Medicare Other | Admitting: Physician Assistant

## 2019-11-13 ENCOUNTER — Ambulatory Visit (INDEPENDENT_AMBULATORY_CARE_PROVIDER_SITE_OTHER): Payer: Medicare Other | Admitting: Internal Medicine

## 2019-11-30 ENCOUNTER — Ambulatory Visit: Payer: Medicare Other | Admitting: Physician Assistant

## 2019-12-31 ENCOUNTER — Ambulatory Visit: Payer: Medicare Other | Admitting: *Deleted

## 2020-03-17 ENCOUNTER — Encounter: Payer: Medicare Other | Admitting: Internal Medicine

## 2020-06-16 ENCOUNTER — Ambulatory Visit: Payer: Medicare Other | Admitting: Adult Health
